# Patient Record
Sex: Female | Born: 1960
Health system: Southern US, Community
[De-identification: ages and names within clinical notes are randomized; demographics above are authoritative.]

## PROBLEM LIST (undated history)

## (undated) DIAGNOSIS — E785 Hyperlipidemia, unspecified: Secondary | ICD-10-CM

## (undated) DIAGNOSIS — R0600 Dyspnea, unspecified: Secondary | ICD-10-CM

## (undated) DIAGNOSIS — I5189 Other ill-defined heart diseases: Secondary | ICD-10-CM

## (undated) DIAGNOSIS — R55 Syncope and collapse: Secondary | ICD-10-CM

## (undated) DIAGNOSIS — I2089 Other forms of angina pectoris: Secondary | ICD-10-CM

## (undated) DIAGNOSIS — Z9889 Other specified postprocedural states: Secondary | ICD-10-CM

## (undated) DIAGNOSIS — M549 Dorsalgia, unspecified: Secondary | ICD-10-CM

## (undated) DIAGNOSIS — M21371 Foot drop, right foot: Secondary | ICD-10-CM

## (undated) DIAGNOSIS — T192XXA Foreign body in vulva and vagina, initial encounter: Secondary | ICD-10-CM

## (undated) DIAGNOSIS — M961 Postlaminectomy syndrome, not elsewhere classified: Secondary | ICD-10-CM

## (undated) DIAGNOSIS — R918 Other nonspecific abnormal finding of lung field: Secondary | ICD-10-CM

## (undated) DIAGNOSIS — F419 Anxiety disorder, unspecified: Secondary | ICD-10-CM

## (undated) DIAGNOSIS — I208 Other forms of angina pectoris: Secondary | ICD-10-CM

## (undated) DIAGNOSIS — K219 Gastro-esophageal reflux disease without esophagitis: Secondary | ICD-10-CM

## (undated) DIAGNOSIS — R079 Chest pain, unspecified: Secondary | ICD-10-CM

## (undated) DIAGNOSIS — R112 Nausea with vomiting, unspecified: Secondary | ICD-10-CM

## (undated) DIAGNOSIS — R519 Headache, unspecified: Secondary | ICD-10-CM

## (undated) DIAGNOSIS — G894 Chronic pain syndrome: Secondary | ICD-10-CM

## (undated) DIAGNOSIS — R51 Headache: Secondary | ICD-10-CM

## (undated) DIAGNOSIS — I10 Essential (primary) hypertension: Secondary | ICD-10-CM

## (undated) HISTORY — PX: ABDOMINAL HYSTERECTOMY: SHX81

## (undated) HISTORY — PX: TUBAL LIGATION: SHX77

## (undated) HISTORY — DX: Anxiety disorder, unspecified: F41.9

## (undated) HISTORY — DX: Hyperlipidemia, unspecified: E78.5

## (undated) HISTORY — PX: COLONOSCOPY: SHX174

## (undated) HISTORY — PX: FOOT SURGERY: SHX648

## (undated) HISTORY — PX: WISDOM TOOTH EXTRACTION: SHX21

---

## 2000-11-03 ENCOUNTER — Other Ambulatory Visit: Admission: RE | Admit: 2000-11-03 | Discharge: 2000-11-03 | Payer: Self-pay | Admitting: Obstetrics and Gynecology

## 2000-12-01 ENCOUNTER — Other Ambulatory Visit: Admission: RE | Admit: 2000-12-01 | Discharge: 2000-12-01 | Payer: Self-pay | Admitting: Obstetrics and Gynecology

## 2000-12-01 ENCOUNTER — Encounter (INDEPENDENT_AMBULATORY_CARE_PROVIDER_SITE_OTHER): Payer: Self-pay | Admitting: Specialist

## 2000-12-07 ENCOUNTER — Encounter (INDEPENDENT_AMBULATORY_CARE_PROVIDER_SITE_OTHER): Payer: Self-pay | Admitting: Specialist

## 2000-12-08 ENCOUNTER — Inpatient Hospital Stay (HOSPITAL_COMMUNITY): Admission: RE | Admit: 2000-12-08 | Discharge: 2000-12-09 | Payer: Self-pay | Admitting: Obstetrics and Gynecology

## 2000-12-30 ENCOUNTER — Encounter: Payer: Self-pay | Admitting: Obstetrics and Gynecology

## 2000-12-30 ENCOUNTER — Ambulatory Visit (HOSPITAL_COMMUNITY): Admission: RE | Admit: 2000-12-30 | Discharge: 2000-12-30 | Payer: Self-pay | Admitting: Obstetrics and Gynecology

## 2001-02-09 ENCOUNTER — Inpatient Hospital Stay (HOSPITAL_COMMUNITY): Admission: AD | Admit: 2001-02-09 | Discharge: 2001-02-09 | Payer: Self-pay | Admitting: Obstetrics and Gynecology

## 2001-04-02 ENCOUNTER — Encounter (INDEPENDENT_AMBULATORY_CARE_PROVIDER_SITE_OTHER): Payer: Self-pay | Admitting: Specialist

## 2001-04-02 ENCOUNTER — Observation Stay (HOSPITAL_COMMUNITY): Admission: RE | Admit: 2001-04-02 | Discharge: 2001-04-03 | Payer: Self-pay | Admitting: Obstetrics and Gynecology

## 2001-04-03 ENCOUNTER — Encounter: Payer: Self-pay | Admitting: Obstetrics and Gynecology

## 2002-03-10 ENCOUNTER — Encounter: Payer: Self-pay | Admitting: Gastroenterology

## 2002-03-10 ENCOUNTER — Encounter: Admission: RE | Admit: 2002-03-10 | Discharge: 2002-03-10 | Payer: Self-pay | Admitting: Gastroenterology

## 2002-04-06 ENCOUNTER — Encounter (INDEPENDENT_AMBULATORY_CARE_PROVIDER_SITE_OTHER): Payer: Self-pay

## 2002-04-06 ENCOUNTER — Observation Stay (HOSPITAL_COMMUNITY): Admission: RE | Admit: 2002-04-06 | Discharge: 2002-04-07 | Payer: Self-pay | Admitting: Obstetrics and Gynecology

## 2002-04-22 ENCOUNTER — Ambulatory Visit (HOSPITAL_COMMUNITY): Admission: RE | Admit: 2002-04-22 | Discharge: 2002-04-22 | Payer: Self-pay | Admitting: Obstetrics and Gynecology

## 2002-04-22 ENCOUNTER — Encounter: Payer: Self-pay | Admitting: Obstetrics and Gynecology

## 2002-11-24 ENCOUNTER — Other Ambulatory Visit: Admission: RE | Admit: 2002-11-24 | Discharge: 2002-11-24 | Payer: Self-pay | Admitting: Gynecology

## 2002-12-12 ENCOUNTER — Encounter: Payer: Self-pay | Admitting: Emergency Medicine

## 2002-12-12 ENCOUNTER — Inpatient Hospital Stay (HOSPITAL_COMMUNITY): Admission: EM | Admit: 2002-12-12 | Discharge: 2002-12-14 | Payer: Self-pay | Admitting: Emergency Medicine

## 2002-12-13 ENCOUNTER — Encounter: Payer: Self-pay | Admitting: Internal Medicine

## 2002-12-13 ENCOUNTER — Encounter (INDEPENDENT_AMBULATORY_CARE_PROVIDER_SITE_OTHER): Payer: Self-pay | Admitting: Cardiovascular Disease

## 2003-02-09 ENCOUNTER — Encounter: Payer: Self-pay | Admitting: Gynecology

## 2003-02-09 ENCOUNTER — Encounter: Admission: RE | Admit: 2003-02-09 | Discharge: 2003-02-09 | Payer: Self-pay | Admitting: Gynecology

## 2003-03-12 ENCOUNTER — Encounter: Payer: Self-pay | Admitting: Emergency Medicine

## 2003-03-12 ENCOUNTER — Emergency Department (HOSPITAL_COMMUNITY): Admission: EM | Admit: 2003-03-12 | Discharge: 2003-03-12 | Payer: Self-pay | Admitting: Emergency Medicine

## 2003-12-13 ENCOUNTER — Other Ambulatory Visit: Admission: RE | Admit: 2003-12-13 | Discharge: 2003-12-13 | Payer: Self-pay | Admitting: Gynecology

## 2004-05-27 ENCOUNTER — Emergency Department (HOSPITAL_COMMUNITY): Admission: EM | Admit: 2004-05-27 | Discharge: 2004-05-27 | Payer: Self-pay | Admitting: Emergency Medicine

## 2005-01-30 ENCOUNTER — Other Ambulatory Visit: Admission: RE | Admit: 2005-01-30 | Discharge: 2005-01-30 | Payer: Self-pay | Admitting: Gynecology

## 2005-04-01 ENCOUNTER — Encounter: Admission: RE | Admit: 2005-04-01 | Discharge: 2005-04-01 | Payer: Self-pay | Admitting: Gynecology

## 2006-02-11 ENCOUNTER — Other Ambulatory Visit: Admission: RE | Admit: 2006-02-11 | Discharge: 2006-02-11 | Payer: Self-pay | Admitting: Gynecology

## 2006-04-03 ENCOUNTER — Encounter: Admission: RE | Admit: 2006-04-03 | Discharge: 2006-04-03 | Payer: Self-pay | Admitting: Gynecology

## 2007-04-06 ENCOUNTER — Encounter: Admission: RE | Admit: 2007-04-06 | Discharge: 2007-04-06 | Payer: Self-pay | Admitting: Obstetrics and Gynecology

## 2007-05-31 ENCOUNTER — Inpatient Hospital Stay (HOSPITAL_COMMUNITY): Admission: EM | Admit: 2007-05-31 | Discharge: 2007-06-05 | Payer: Self-pay | Admitting: Emergency Medicine

## 2007-10-08 ENCOUNTER — Encounter: Admission: RE | Admit: 2007-10-08 | Discharge: 2007-10-08 | Payer: Self-pay | Admitting: Gastroenterology

## 2008-04-06 ENCOUNTER — Encounter: Admission: RE | Admit: 2008-04-06 | Discharge: 2008-04-06 | Payer: Self-pay | Admitting: Obstetrics and Gynecology

## 2009-04-10 ENCOUNTER — Encounter: Admission: RE | Admit: 2009-04-10 | Discharge: 2009-04-10 | Payer: Self-pay | Admitting: Obstetrics and Gynecology

## 2009-04-24 ENCOUNTER — Ambulatory Visit: Payer: Self-pay | Admitting: Vascular Surgery

## 2009-04-24 ENCOUNTER — Observation Stay (HOSPITAL_COMMUNITY): Admission: EM | Admit: 2009-04-24 | Discharge: 2009-04-25 | Payer: Self-pay | Admitting: Emergency Medicine

## 2009-04-24 ENCOUNTER — Encounter (INDEPENDENT_AMBULATORY_CARE_PROVIDER_SITE_OTHER): Payer: Self-pay | Admitting: Emergency Medicine

## 2011-02-03 LAB — BASIC METABOLIC PANEL
Creatinine, Ser: 0.98 mg/dL (ref 0.4–1.2)
GFR calc Af Amer: >60 mL/min (ref >60)
GFR calc non Af Amer: >60 mL/min (ref >60)
Glucose, Bld: 93 mg/dL (ref 70–99)
Potassium: 3.9 mEq/L (ref 3.5–5.1)
Sodium: 139 mEq/L (ref 135–145)

## 2011-02-03 LAB — CBC
Hemoglobin: 13 g/dL (ref 12.0–15.0)
MCHC: 33.8 g/dL (ref 30.0–36.0)
MCV: 92.4 fL (ref 78.0–100.0)
Platelets: 284 10*3/uL (ref 150–400)
RBC: 4.16 MIL/uL (ref 3.87–5.11)

## 2011-02-03 LAB — PROTIME-INR
INR: 0.9 (ref 0.00–1.49)
Prothrombin Time: 12.6 seconds (ref 11.6–15.2)

## 2011-02-03 LAB — DIFFERENTIAL
Basophils Absolute: 0 10*3/uL (ref 0.0–0.1)
Basophils Relative: 1 % (ref 0–1)
Lymphocytes Relative: 48 % — ABNORMAL HIGH (ref 12–46)
Lymphs Abs: 2.4 10*3/uL (ref 0.7–4.0)
Neutro Abs: 2 10*3/uL (ref 1.7–7.7)
Neutrophils Relative %: 42 % — ABNORMAL LOW (ref 43–77)

## 2011-02-03 LAB — POCT I-STAT, CHEM 8
Creatinine, Ser: 1.1 mg/dL (ref 0.4–1.2)
Potassium: 4.1 mEq/L (ref 3.5–5.1)
Sodium: 138 mEq/L (ref 135–145)

## 2011-02-03 LAB — RAPID URINE DRUG SCREEN, HOSP PERFORMED
Amphetamines: NOT DETECTED
Barbiturates: NOT DETECTED
Benzodiazepines: NOT DETECTED
Cocaine: NOT DETECTED
Opiates: POSITIVE — AB

## 2011-02-03 LAB — CARDIAC PANEL(CRET KIN+CKTOT+MB+TROPI)
Relative Index: 0.9 (ref 0.0–2.5)
Troponin I: 0.01 ng/mL (ref 0.00–0.06)

## 2011-02-03 LAB — URINALYSIS, ROUTINE W REFLEX MICROSCOPIC
Bilirubin Urine: NEGATIVE
Protein, ur: NEGATIVE mg/dL
Specific Gravity, Urine: 1.008 (ref 1.005–1.030)

## 2011-02-03 LAB — HEPATIC FUNCTION PANEL
ALT: 20 U/L (ref 0–35)
AST: 28 U/L (ref 0–37)
Albumin: 3.8 g/dL (ref 3.5–5.2)
Alkaline Phosphatase: 64 U/L (ref 39–117)
Bilirubin, Direct: <0.1 mg/dL (ref 0.0–0.3)

## 2011-02-03 LAB — POCT CARDIAC MARKERS
Myoglobin, poc: 91.8 ng/mL (ref 12–200)
Troponin i, poc: <0.05 ng/mL (ref 0.00–0.09)

## 2011-02-03 LAB — APTT: aPTT: 35 seconds (ref 24–37)

## 2011-02-03 LAB — TROPONIN I: Troponin I: 0.01 ng/mL (ref 0.00–0.06)

## 2011-02-03 LAB — CK TOTAL AND CKMB (NOT AT ARMC): CK, MB: 1 ng/mL (ref 0.3–4.0)

## 2011-02-03 LAB — LIPASE, BLOOD: Lipase: 64 U/L — ABNORMAL HIGH (ref 11–59)

## 2011-03-11 NOTE — H&P (Signed)
[name], [name]           ACCOUNT NO.:  [id number]   MEDICAL RECORD NO.:  [id number]          PATIENT TYPE:  EMS   LOCATION:  MAJO                         FACILITY:  [facility]   PHYSICIAN:  Lonia Blood, M.D.DATE OF BIRTH:  July 19, 1961   DATE OF ADMISSION:  05/31/2007  DATE OF DISCHARGE:                              HISTORY & PHYSICAL   CHIEF COMPLAINT:  Chest pain.   HISTORY OF PRESENT ILLNESS:  [name] is a 50 year old  pleasant female with a significant family history of coronary disease,  and a personal history of hypertension.  She is on hormone replacement  therapy.  She herself had a cardiac catheterization in 2004, which was  completely normal.  She reports that for the last week she has had  intermittent upper sternal and left-sided pressure-type chest pain.  She  also describes this as a catch in her chest intermittently.  This pain  has occurred at rest, but does in fact to be worse with exertion.  When  she exerts herself she has had significant shortness of breath, beyond  her usual.  This has been progressively worsening over the last week.  The sensation of the catching in her chest has also worsened at times  with particular movements, or with the physical exertion.   There has been no hematemesis.  There has been no nausea or vomiting.  There has been no diarrhea or constipation.  The patient has been very  gassy.  She has belched and been quite flatulent.  These episodes,  however, have failed to reveal her chest symptoms.   Her symptoms worsened over the weekend.  Therefore she thought that it  was best to present for evaluation in the emergency room tonight -- at  the encouragement of her husband.  The patient has also noted a  significant swelling in both of hands for the last week.  She has also  felt that her right foot has been swelling, with no similar swelling in  the left foot.  There has been no pain in the calf, and no  generalized  swelling of the entire leg, however.  She has not experienced these  symptoms until this week.   PAST MEDICAL HISTORY:  1. Hypertension.  2. Status post hysterectomy with bilateral salpingo-oophorectomy.  3. Prior history of moderate obesity.  4. Normal cardiac catheterization, February 2004.   OUTPATIENT MEDICATIONS:  1. Hydrochlorothiazide unknown dose daily.  2. Hormone replacement therapy.   ALLERGIES:  PENICILLIN.   FAMILY HISTORY:  The patient has multiple maternal aunts who had  coronary disease, and died before 20.  The patient's mother died in her  mid-40s from an MI.   SOCIAL HISTORY:  The patient does not smoke, nor has she ever.  She  occasionally partakes of alcohol, but not to excess.  She is married.  She has one healthy child.  She works in an office setting.   LABS:  Point-of-care cardiac markers are negative x2.  A 12-lead EKG  reveals normal sinus rhythm with a first-degree AV block; with a PR of  213, but no acute ST  or T-wave changes.  A chest x-ray reveals no acute  disease.  A CBC is unremarkable.  BMET is unremarkable, with the  exception of potassium at 3.3.   PHYSICAL EXAMINATION:  Temperature 97.5, blood pressure 168/91, heart  rate 87, respiratory rate 20.  O2 saturations 100% on room air.  GENERAL:  A well developed, well nourished, moderately overweight female  in no acute respiratory distress.  HEENT:  Normocephalic and atraumatic.  Pupils are equal, round and  reactive to light and accommodation.  Extraocular muscles intact  bilaterally.  OC/OP clear.  NECK:  No JVD, no lymphadenopathy.  LUNGS:  Clear to auscultation bilaterally, without wheezes or rhonchi.  CARDIOVASCULAR:  Regular rate and rhythm, without murmur, gallop or rub.  Normal S1 and S2.  ABDOMEN:  Nontender, nondistended.  Soft.  Bowel sounds are present.  No  hepatosplenomegaly. No rebound and no ascites.  EXTREMITIES:  No significant clubbing, cyanosis or edema of  bilateral  lower extremities.  NEUROLOGIC:  Strength 5/5 bilateral upper and lower extremities.  Intact  to sensation and pinprick throughout.  Cranial nerves II-XII intact  bilaterally.  Alert and oriented x4.   IMPRESSION AND PLAN:  1. CHEST PAIN.  The 2 primary concerns regarding Ms. Purpura's      chest pain are that of true angina versus pulmonary embolism.  The      fact that she is on hormone replacement therapy puts her at      increased risk.  Her moderate obesity also increases her risk as      well.  The complaint of unilateral right foot swelling is      concerning.   I will place the patient on full dose Lovenox.  We will obtain a spiral  CT scan of the chest.  If this is positive, our evaluation will be done.  If this is in fact negative, we will consider Doppler evaluation.  At  the present time, however, the patient does not clinically appear to be  suffering with severe edema.   In regards to the patient's chest pain, her family history is quite  concerning.  I will check her lipid level.  We will place her  empirically on aspirin and a nitroglycerin drip as her pain continues.  We will place her on full-dose Lovenox for the combined indication of  possible PE, as well as, what sounds to be, unstable angina.  It is true  that she had a normal cardiac catheterization approximately 4 years ago,  but certainly 4 years is enough time to accumulate significant coronary  disease.   We will rule her out with serial cardiac enzymes.  We will treat her  with a beta blocker as well as an ACE inhibitor.  We will follow serial  EKGs.  We will consider further risk stratification when other issues  are ruled out.   1. UNCONTROLLED HYPERTENSION.  The patient has poorly controlled      hypertension.  Part of this is likely due to the stress of her      acute hospitalization.  We will treat the patient with her usual      hydrochlorothiazide; but we will also add beta blocker  and ACE      inhibitor (given the indication of possible coronary disease).   1. HYPOKALEMIA.  This appears to be related to the patient's ongoing      hydrochlorothiazide therapy.  We will replace her with IV potassium  as well as p.o.  We will check a magnesium level.      Lonia Blood, M.D.  Electronically Signed     JTM/MEDQ  D:  05/31/2007  T:  05/31/2007  Job:  [id number]   cc:   Olene Craven, M.D.

## 2011-03-11 NOTE — H&P (Signed)
[name], [name]           ACCOUNT NO.:  [id number]   MEDICAL RECORD NO.:  [id number]          PATIENT TYPE:  OBV   LOCATION:  4709                         FACILITY:  [facility]   PHYSICIAN:  Corinna L. Lendell Caprice, MDDATE OF BIRTH:  05-28-1961   DATE OF ADMISSION:  04/24/2009  DATE OF DISCHARGE:                              HISTORY & PHYSICAL   CHIEF COMPLAINT:  Nausea, vomiting, chest pain.   HISTORY OF PRESENT ILLNESS:  [name] is a 50 year old black  female who presents with pleuritic chest pain.  She also has had nausea  and vomiting for 1-2 days.  She has received several sedating  medications and is currently a somewhat difficult historian.  I am told  that prior to these medications she was more coherent.  She has a  history recurrent chest pain.  She had a cardiac catheterization in  2004, which showed normal coronary arteries and left ventricle.  She  also had admission in 2008 for chest pain, which was atypical but the  patient was sent for outpatient stress test.  She reports that the  stress test was normal.  Her pain is slightly better currently.  She is  currently still nauseated.  She has no abdominal pain.  She has received  2 doses of morphine, aspirin, albuterol, Atrovent, Zofran, Benadryl,  prednisone, and sublingual nitroglycerin here in the emergency room.  We  have no records from [facility].  The patient  denies fevers, chills.  She denies any hematemesis.  She denies cough,  diarrhea.   PAST MEDICAL HISTORY:  1. Hypertension.  2. Hyperlipidemia.  3. Noncardiac chest pain.   MEDICATIONS:  Per triage notes include metoprolol 50 mg twice a day,  Diovan dose unknown.  The patient reports that she was on about 7 other  medications and weaned herself off many of them.  On discharge in  2008, she had also been on hydrochlorothiazide, Lotensin, aspirin,  Zocor, and a prednisone taper as well as Maalox.   She is allergic to  PENICILLIN.   SOCIAL HISTORY:  She denies smoking or excessive alcohol use.  She  denies drug use.   FAMILY HISTORY:  Her mother died of heart attack in her 23s.   REVIEW OF SYSTEMS:  As above, otherwise negative, although the patient  appears slightly confused.   PHYSICAL EXAMINATION:  VITAL SIGNS:  Temperature is 97.8, blood pressure  161/94, pulse 60, respiratory rate 20, oxygen saturation 100% on room  air.  GENERAL:  The patient is a groggy-appearing quiet female who  occasionally falls asleep, but has facial grimace.  HEENT:  Normocephalic, atraumatic.  Pupils equal, round, and reactive to  light.  She has slightly dry mucous membranes.  Oropharynx is without  erythema or exudate.  NECK:  Supple.  No lymphadenopathy.  No carotid bruits.  No JVD.  LUNGS:  Diminished throughout.  Poor inspiratory effort.  No wheezes,  rhonchi, or rales.  CARDIOVASCULAR:  Regular rate and rhythm without murmurs, gallops, or  rubs.  No chest wall tenderness.  ABDOMEN:  Obese, soft, nontender, nondistended.  GU:  Deferred.  RECTAL:  Deferred.  EXTREMITIES:  No clubbing, cyanosis, or edema.  SKIN:  No rash.  PSYCHIATRIC:  Occasional agitation.  NEUROLOGIC:  She is groggy, oriented x3.  Cranial nerves and  sensorimotor exam are intact.   LABORATORY DATA:  CBC is unremarkable.  PT/PTT normal.  Basic metabolic  panel normal.  Point-of-care enzymes normal.  Urinalysis shows a  specific gravity of 1.008, negative ketones, negative blood, negative  protein, negative nitrite, negative leukocyte esterase.  EKG reportedly  shows normal sinus rhythm, is not currently on the chart.  CT angiogram  of the chest shows no pulmonary emboli.  No pericardial effusion.  No  pulmonary edema or pleural fluid.  Limited view of the upper abdomen  unremarkable.  Gas present throughout the esophagus.  Two views of the  chest is normal.  Bilateral Dopplers of her legs showed no DVT.   ASSESSMENT AND PLAN:  1.  Atypical chest pain:  We will try Toradol.  Suspect      gastrointestinal in origin given her recent vomiting history.  I      will obtain records from [facility].  She will      be placed on telemetry on observation.  Check liver function tests,      lipase, serial cardiac enzymes, urine drug screen.  She will get      proton pump inhibitor, nausea medication as needed.  Currently, she      is quite sedated and we will avoid any further morphine.  2. Hypertension.  Medications unclear.  We will give Diovan 80 mg and      continue metoprolol.  3. Reported hyperlipidemia.  Apparently, she has been noncompliant      with medications.  4. Previous history of chest pain.  See above.  5. Obesity.      Corinna L. Lendell Caprice, MD  Electronically Signed     Corinna L. Lendell Caprice, MD  Electronically Signed    CLS/MEDQ  D:  04/24/2009  T:  04/25/2009  Job:  [id number]   cc:   Stacie Acres. Cliffton Asters, M.D.

## 2011-03-11 NOTE — Discharge Summary (Signed)
[name], [name]           ACCOUNT NO.:  [id number]   MEDICAL RECORD NO.:  [id number]          PATIENT TYPE:  INP   LOCATION:  3705                         FACILITY:  [facility]   PHYSICIAN:  Madaline Savage, MD        DATE OF BIRTH:  June 05, 1961   DATE OF ADMISSION:  05/31/2007  DATE OF DISCHARGE:  06/05/2007                               DISCHARGE SUMMARY   PRIMARY CARE PHYSICIAN:  Dr. Barbee Shropshire.   CONSULTATIONS:  She was seen by Dr. Alanda Amass in the hospital from  [facility].   DISCHARGE DIAGNOSES:  1. Atypical chest pain, rule out acute coronary syndrome, for      outpatient stress test.  2. Poorly controlled hypertension.  3. Hyperlipidemia.  4. Strong family history of coronary artery disease.  5. Hypokalemia.   DISCHARGE MEDICATIONS:  1. Hydrochlorothiazide 25 mg daily.  2. Lopressor 50 mg twice daily.  3. Lotensin 10 mg daily.  4. Aspirin 81 mg daily.  5. Zocor 40 mg daily.  6. Prednisone 20 mg once a day for 2 days and then 10 mg once a day      for 2 days and then stop.  7. Maalox 17 g in 8 oz water daily as needed.   HISTORY OF PRESENT ILLNESS:  For history and physical, see the history  and physical dictated by Dr. Sharon Seller on May 31, 2007.  In short, Ms.  [name] is a 50 year old lady with a strong family history of  coronary artery disease, who came in with the complaint of atypical  chest pain.  She was admitted for further evaluation and management.   PROBLEM LIST:  1. Chest pain.  She was admitted and three sets of cardiac markers      were done, which were negative.  We consulted [facility] people, who evaluated the patient and they think it is      likely that this patient has cardiac chest pain.  She did have a      cardiac catheterization in February 2004, which was normal.  They      arranged for an outpatient stress test at this point in time.  She      is still getting this chest  discomfort on and off at this point in      time.  She will continue on the aspirin and beta blocker at home.  2. Uncontrolled hypertension, high blood pressure.  Both markedly      elevated in the course of hospital stay.  She was started on      Lopressor and Lotensin.  At this time, her blood pressure is under      good control.  She will continue on these at home.  3. Hyperlipidemia.  We did a lipid profile on her and that showed an      elevated cholesterol of 213 and a LDL of 150.  HDL was 49 and      triglyceride was 72.  She was started on Zocor 40 mg once daily,  which she will continue at home.   DISPOSITION:  She is now being discharged home in stable condition.  She  has a follow up appointment with cardiology on August 19, and she is  scheduled for stress Myoview on August 14 at the [facility]  [facility] office.   FOLLOW UP:  She was asked to follow up with her primary care doctor, Dr.  Barbee Shropshire in one week, and she has a follow up appointment with Dr.  Alanda Amass on June 15, 2007 at 10:30 a.m.  She will follow up with Dr.  Alanda Amass after her stress test, which is scheduled on June 10, 2007  at 9:45 a.m.      Madaline Savage, MD  Electronically Signed     PKN/MEDQ  D:  06/05/2007  T:  06/05/2007  Job:  [id number]   cc:   Olene Craven, M.D.

## 2011-03-14 NOTE — Discharge Summary (Signed)
[facility]  Patient:    [name], [name] Visit Number: [id number] MRN: [id number]          Service Type: DSU Location: [id number] Attending Physician:  Leonard Schwartz Dictated by:   Janine Limbo, M.D. Admit Date:  04/06/2002 Discharge Date: 04/07/2002                             Discharge Summary  DISCHARGE DIAGNOSES:             1. Pelvic pain.                                  2. Pelvic adhesions.                                  3. Left ovarian cyst.  PROCEDURES THIS ADMISSION:       1. Diagnostic laparoscopy.                                  2. Laparoscopic lysis of adhesions.                                  3. Laparoscopic left salpingo-oophorectomy.  HISTORY OF PRESENT ILLNESS:      The patient is a 50 year old female, para 1-0-3-1, who presents with pelvic pain and a history of pelvic adhesions.  She has already had a laparoscopic vaginal hysterectomy as well as a laparoscopic right salpingo-oophorectomy.  These were both performed in 2002.  The patient continues to have pain and she presents now to have her left tube and ovary removed.  Please see her dictated history and physical exam for details.  HOSPITAL COURSE:                 On April 06, 2002, the patient was taken to the operating room where she underwent a diagnostic laparoscopy.  Operative findings included adhesions between the large bowel and the left pelvic side wall.  She was noted to have a corpus luteum cyst on the left ovary.  The adhesions were lysed and the left tube and ovary were removed without difficulty.  The patient tolerated her procedure well.  Her postoperative course was complicated by some nausea which resolved with antiemetics.  She remained afebrile.  On the morning of April 07, 2002, the patient was feeling better and her vital signs were stable.  She was discharged to home.  DISCHARGE MEDICATIONS:           1. The patient was given  Percocet one or two                                     tablets every 4 hours as needed for pain.                                  2. Phenergan 25 mg every 6 hours as needed  for nausea.                                  3. Climara patch 0.05 mg once each week.  DISCHARGE INSTRUCTIONS:          The patient will return to see Dr. Stefano Gaul in 2 weeks.  She will call for questions or concerns.  She was given a copy of the postoperative instruction sheet as prepared by the [facility] for patients who have undergone laparoscopy. Dictated by:   Janine Limbo, M.D. Attending Physician:  Leonard Schwartz DD:  04/07/02 TD:  04/09/02 Job: [id number]

## 2011-03-14 NOTE — H&P (Signed)
[facility]  Patient:    [name], [name] Visit Number: [id number] MRN: [id number]          Service Type: Attending:  Janine Limbo, M.D. Dictated by:   Janine Limbo, M.D. Adm. Date:  04/06/02                           History and Physical  HISTORY OF PRESENT ILLNESS:   The patient is a 50 year old female, para 1-0-3-1, who presents for a diagnostic laparoscopy and laparoscopic left salpingo-oophorectomy because of pelvic pain.  The patient had a laparoscopy-assisted vaginal hysterectomy in February 2002.  In June 2002, the patient had a diagnostic laparoscopy and a laparoscopic right salpingo-oophorectomy and laparoscopic lysis of adhesions.  The patient has a history of fibroids and pelvic adhesions.  The patient reports that, since the time of her surgery, she has had increasing left pelvic pain to the point that she is unable to work.  She suffered some severe dyspareunia and this has become a problem in her life.  OBSTETRICAL HISTORY:          The patient has had one term vaginal delivery and three elective pregnancy terminations.  PAST MEDICAL HISTORY:         The patient had depression associated with her pregnancy.  The patient has a history of hypertension and she is currently taking triamterene/hydrochlorothiazide 37.5/25.  DRUG ALLERGIES:               None known.  SOCIAL HISTORY:               The patient denies cigarette use, alcohol use, and recreational drug use.  REVIEW OF SYSTEMS:            The patient complains of dyspareunia.  She also has alternating bouts of constipation and diarrhea.  She says that she is somewhat depressed because of her pain and discomfort.  FAMILY HISTORY:               Both of the patients parents have hypertension. Her mother has heart disease.  PHYSICAL EXAMINATION:  VITAL SIGNS:                  Weight is 171 pounds.  HEENT:                        Within normal limits.  CHEST:                         Clear.  HEART:                        Regular rate and rhythm.  BREASTS:                      Without masses.  ABDOMEN:                      Nontender.  EXTREMITIES:                  Within normal limits.  NEUROLOGIC:                   Exam is grossly normal.  PELVIC:                       External genitalia is  normal.  The vagina is normal.  The cuff is well healed.  The cervix is absent.  The uterus is absent.  Adnexa:  There is tenderness in the left pelvis though no discrete masses can be appreciated.  Rectovaginal exam confirms.  ASSESSMENT:                   1. Pelvic pain.                               2. History of pelvic adhesions.  PLAN:                         The patient will undergo a diagnostic laparoscopy followed by a laparoscopic left salpingo-oophorectomy.  She understands the indications for her procedure and she accepts the risks of, but not limited to, anesthetic complications, bleeding, infections, and possible damage to the surrounding organs.  She understands that no guarantees can be given concerning the relief of her pelvic discomfort.  She understands that, if she should remove her left ovary, then hormone replacement therapy will likely be necessary.  We discussed the [facility] study as it relates to hormone replacement therapy and she understands the increased risk of breast cancer, heart disease, strokes, and deep venous thrombosis associated with Prempro use.  After carefully considering all the above, the patient still wants to proceed with removal of her only remaining ovary. Dictated by:   Janine Limbo, M.D. Attending:  Janine Limbo, M.D. DD:  04/05/02 TD:  04/05/02 Job: [id number]

## 2011-03-14 NOTE — Cardiovascular Report (Signed)
NAME:  [name], [name]                     ACCOUNT NO.:  [id number]   MEDICAL RECORD NO.:  [id number]                   PATIENT TYPE:  INP   LOCATION:  4731                                 FACILITY:  [facility]   PHYSICIAN:  Richard A. Alanda Amass, M.D.          DATE OF BIRTH:  1961/07/08   DATE OF PROCEDURE:  12/13/2002  DATE OF DISCHARGE:                              CARDIAC CATHETERIZATION   PROCEDURE:  1. Retrograde central aortic catheterization.  2. Selective coronary angiography by Judkins technique.  3. Left ventricular angiogram, RAO/LAO projection.  4. Abdominal angiogram, midstream PA projection.  5. Common right femoral artery closure with single-stitch Perclose AT     device, successful.   DESCRIPTION OF PROCEDURE:  The patient was brought to the second floor CP  lab in the postabsorptive state after 5 mg Valium p.o. premedication.  The  CRFA was entered with a single anterior puncture using a #18 thin-walled  needle, and a 6-French short Daig sidearm sheath was inserted after the  patient was given 2 mg of Versed IV for sedation.  Catheterization was done  with 6-French 4-cm taper, preformed Cordis coronary, and pigtail catheters.  LV angiogram was done in the RAO and LAO projection at 25 cc/14 cc per  second, and 20 cc/12 cc per second.  Omnipaque dye was used throughout the  procedure.  Pullback pressure of the CA was performed showing no gradient  across the aortic valve.  Abdominal aortic angiogram was done above the  level of the renal arteries with a pigtail catheter at 15 cc/20 cc per  second, demonstrating single normal renal arteries bilaterally and normal  infrarenal abdominal aorta on limited injection.  Right femoral angiogram  was done through the sidearm sheath in the oblique projection showing a good  stick into the high RCFA with a nice, smooth vessel and intact hypogastrics.  Heparin was held, on call to the laboratory.  The right femoral puncture was  then closed with a single stitch, 6-French Perclose AT device, successfully.  The patient was transferred to the holding area for postoperative care in  stable condition with room air, ABGs pending.   PRESSURES:  1. LV:  134/0; LVEDP:  16 mmHg.  2. CA:  134/80 mmHg.  3. There was no gradient across the aortic valve on catheter pullback.   ANGIOGRAPHIC DATA:  1. LV angiogram in the RAO and LAO projections show a normally contracting     ventricle with sinus beats.  Estimated EF approximately 55%.  No mitral     regurgitation present.  2. The main left coronary was normal.  3. The left anterior descending artery was widely patent and smooth     throughout its course.  It coursed to the apex and undersurface of the     heart.  It gave off 3 normal moderate-sized diagonal branches from the     proximal and mid LAD.  4. The circumflex artery was nondominant,  of moderate size, giving off a     normal small OM1, a normal bifurcating large OM2, and a normal PABG and     atrial branch.  5. The right coronary was a dominant vessel, tortuous, widely patent, and     smooth throughout its course.  There was some mild catheter spasm at the     tip of the RCA catheter, resolved with catheter removal.  It was a normal     vessel throughout with normal flow.  There was no coronary spasm     visualized.   CONCLUSION:  This 50 year old African-American married mother of one 14-year-  old is a nonsmoker but has a strong family history of coronary artery  disease.  One sister had a CABG at age 53, and her mother had an MI in her  late 24s.  She has been on HRT for the last 6-12 months after remote  hysterectomy.  She has a history of exogenous obesity and systemic  hypertension, on Maxzide.  She was admitted to the hospital with several  days of chest discomfort, waxing and waning, and prolonged chest discomfort  on the day of admission, responding to IV Toradol.  This chest pain was  characterized as  substernal pressure with an inspiratory pleuritic-type  component with no pleural or pericardial rubs.  Room air O2 saturations were  100%, and ABGs are pending.  There were no acute EKG changes, and myocardial  infarction was ruled out by serial enzymes and her EKGs.  The patient had  recurrent episodes of pain in the hospital today, on medical therapy, and  with her family history and risk factors, it was felt best to proceed with  diagnostic angiography.   Fortunately, diagnostic coronary angiography reveals entirely normal  coronary anatomy with no evidence of atherosclerotic disease, no evidence of  renal artery stenosis, or fibromuscular dysplasia (FMV) with her history of  hypertension.   The etiology of her chest pain is not clear.  Clinically, it appears to be  musculoskeletal, possibly costochondritis.  Since she is on HRT, she may  require a spiral CT for further evaluation to rule out PE.   CATHETERIZATION DIAGNOSES:  1. Chest pain, etiology not determined.  2. Normal coronary arteries and left ventricle.  3. Systemic hypertension, normal renal arteries.  4. Exogenous obesity.  5. Strong family history of coronary disease.  6. Possible gastroesophageal reflux disease.  7. Recent hormone replacement therapy.  Rule out pulmonary embolus.                                               Richard A. Alanda Amass, M.D.    RAW/MEDQ  D:  12/13/2002  T:  12/13/2002  Job:  [id number]   cc:   Cardiac Catheterization Lab   Olene Craven, M.D.  [address]  [address]  [place]  [place] [address]  Fax: [phone]

## 2011-03-14 NOTE — Discharge Summary (Signed)
[facility]  Patient:    [name], [name]                    MRN: [id number] Adm. Date:  [id number] Disc. Date: 12/09/00 Attending:  Leonard Schwartz Dictator:   Henreitta Leber, P.A.                           Discharge Summary  DISCHARGE DIAGNOSES:          1. Pelvic pain.                               2. Uterine fibroids.                               3. Dysmenorrhea.                               4. Menorrhagia.                               5. Pelvic adhesions.  OPERATIONS:                   Laparoscopic-assisted vaginal hysterectomy.  HISTORY OF PRESENT ILLNESS:   [name] is a 50 year old married African-American female, para 1-0-3-1, with a history of uterine fibroids, dysmenorrhea, and menorrhagia, who presents for laparoscopic-assisted vaginal hysterectomy because of chronic pelvic discomfort.  Please see the patients dictated history and physical exam for details.  PHYSICAL EXAMINATION:  VITAL SIGNS:                  Weight is 173 pounds, height is 5 feet 5 inches tall.  GENERAL:                      Within normal limits.  PELVIC:                       EGBUS is within normal limits.  The vagina is normal.  Cervix is slightly tender to motion.  The uterus is difficult to assess secondary to discomfort.  Adnexa are tender.  RECTOVAGINAL:                 Tender.  HOSPITAL COURSE:              On the date of admission the patient underwent a laparoscopic-assisted vaginal hysterectomy during which time it was discovered that the patient had some pelvic adhesions of the right adnexal region. However, the patient tolerated the procedure well.  Postoperative course was marked by the patient having difficulty achieving relief from her pain. However, by the evening of postoperative day #1, she was comfortable and continued to progress.  By postoperative day #2 the patient had achieved return of bowel and bladder function and was  deemed ready for discharge home. Postoperative hemoglobin 10.6, preoperative hemoglobin 12.5.  DISCHARGE MEDICATIONS:        1. Percocet 1-2 tablets every four to six hours                                  as needed for pain.  2. Ibuprofen 600 mg 1 tablet every six hours                                  with food for five days, then as needed for                                  pain.                               3. Phenergan 25 mg 1 tablet four times a day as                                  needed for nausea.                               4. Stool softener 60 mg 1 tablet twice daily                                  until bowel movements are regular.                               5. Iron 325 mg 1 tablet twice daily for six                                  weeks.  FOLLOW-UP:                    The patient has an appointment in six weeks with Dr. Leonard Schwartz on January 18, 2001, at 11 a.m.  DISCHARGE INSTRUCTIONS:       The patient was given a copy of [facility] postoperative instruction sheet.  She was further advised to avoid driving for two weeks, heavy lifting for four weeks, and intercourse for six weeks.  PATHOLOGY:                    Final pathology was not available at time of the patients discharge.DD:  12/09/00 TD:  12/09/00 Job: [id number]

## 2011-03-14 NOTE — H&P (Signed)
[facility]  Patient:    [name], [name]                    MRN: [id number] Adm. Date:  [id number] Disc. Date: [id number] Attending:  Leonard Schwartz                         History and Physical  HISTORY OF PRESENT ILLNESS:   The patient is a 50 year old female, para 1-0-3-1, who presents for diagnostic laparoscopy and laparoscopic right oophorectomy because of right pelvic pain.  The patient had a laparoscopy-assisted vaginal hysterectomy on December 07, 2000.  Operative findings showed a uterus that was upper limits normal size and fibroids were present in the uterus.  The ovaries appeared normal.  There were scars on the fallopian tubes and the tubes were shortened from prior surgery.  The patient has had continued pain since her hysterectomy.  An ultrasound has shown ovarian cysts.  Pain medications have not relieved her discomfort. The patient reports that she is now to the point she is having difficulty performing her normal daily functions.  She wants to proceed with oophorectomy.  OBSTETRICAL HISTORY:          The patient has had one term vaginal delivery and three elective pregnancy terminations.  PAST MEDICAL HISTORY:         The patient had depression associated with her pregnancy.  MEDICATIONS:                  She is currently taking pain medications only.  DRUG ALLERGIES:               None known.  SOCIAL HISTORY:               The patient denies cigarette use, alcohol use, and recreational drug use.  REVIEW OF SYSTEMS:            The patient complains of dyspareunia.  She has had alternating bouts of constipation and diarrhea.  FAMILY HISTORY:               Both of the patients parents have hypertension. The patients mother has heart disease.  PHYSICAL EXAMINATION:  GENERAL:                      Weight is 173 pounds.  HEENT:                        Within normal limits.  CHEST:                        Clear.  HEART:                         Regular rate and rhythm.  BREASTS:                      Without masses.  ABDOMEN:                      Nontender.  EXTREMITIES:                  Within normal limits.  NEUROLOGICAL EXAM:            Grossly normal.  PELVIC EXAM:  External genitalia is normal.  The vagina is normal.  Cervix is absent, uterus is absent.  Adnexa; tenderness on the right. Rectovaginal exam confirms.  ASSESSMENT:                   1. Continued pelvic pain.                               2. History of ovarian cysts.                               3. Dyspareunia.  PLAN:                         A long discussion was held with the patient about her medical and surgical options.  She elects to proceed with diagnostic laparoscopy and she wants to proceed with right oophorectomy.  She understands that no guarantees can be given concerning the complete relief of her pain. She accepts the risks associated with her procedure and she understands those risks are not limited to anesthetic complications, bleeding, infections, or possible damage to the surrounding organs. DD:  03/25/01 TD:  03/25/01 Job: [id number]

## 2011-03-14 NOTE — Op Note (Signed)
[facility]  Patient:    [name], [name]                    MRN: [id number] Proc. Date: 04/02/01 Adm. Date:  [id number] Attending:  Leonard Schwartz                           Operative Report  PREOPERATIVE DIAGNOSES:       1. Right lower quadrant pain.                               2. Pelvic adhesions.                               3. Right ovarian cyst.  POSTOPERATIVE DIAGNOSES:      1. Right lower quadrant pain.                               2. Pelvic adhesions.                               3. Right ovarian cyst.  PROCEDURE:                    1. Diagnostic laparoscopy.                               2. Laparoscopic right salpingo-oophorectomy.                               3. Laparoscopic lysis of adhesions.  SURGEON:                      Janine Limbo, M.D.  ANESTHESIA:                   General.  DISPOSITION:                  Ms. Pollick is a 50 year old female, para 1-0-3-1, who presents with the above-mentioned diagnosis. The patient had a laparoscopy assisted vaginal hysterectomy in February in 2002. She was found to have fibroids. Her ovaries appeared normal. There were scars on the fallopian tubes and her tubes were shortened from prior surgery. After her vaginal hysterectomy, the patient had severe right lower quadrant pain. She presents now for diagnostic laparoscopy. She asks that her right tube and ovary be removed if her left tube and ovary are normal. She understands the indications for her procedure and she accepts the risk of, but not limited to, anesthetic complications, bleeding, infections, and possible damage to the surrounding organs.  FINDINGS:                     The uterus was surgically absent. The left fallopian tube and the left ovary were scarred to the bowel on the right side. Once the scars were removed, then the left tube and ovary appeared normal. The right fallopian tube and right ovary were  scarred to the right pelvic sidewall. There was a 2 cm cyst of the right ovary. There were adhesions between the bowel and the left pelvic  sidewall. There were adhesions between loops of bowel in the midline. The appendix, liver, and upper abdomen appeared normal.  ESTIMATED BLOOD LOSS:         20 cc.  DESCRIPTION OF PROCEDURE:     The patient was taken to the operating room where a general anesthetic was given. The patients abdomen, perineum, and vagina were prepped with multiple layers of Betadine. A Foley catheter was placed in the bladder. The patient then had an examination under anesthesia. A padded sponge stick was placed in the vagina. The patient was sterilely draped. The subumbilical incision was injected with 0.5% Marcaine. An incision was made and the Veress needle was inserted without difficulty. Proper placement was confirmed using the saline drop test. A pneumoperitoneum was obtained. Two suprapubic areas were injected with 0.5% Marcaine and two 5 mm trocars were placed in the lower abdomen. Pictures were taken of the patients pelvic anatomy. The adhesions were lysed using a combination of blunt dissection, sharp dissection, and hydrodissection. The right ureter was identified and followed through its course in the pelvis. The adhesions between the right fallopian tube and the right ovary and the right pelvic sidewall were lysed. The right infundibulopelvic ligament was then isolated. Two Endoloops of 0 Vicryl were placed around the infundibulopelvic ligament. The ligament was cut. Hemostasis was noted to be adequate. The right ovary and tube were cut into segments and removed through the umbilical port. The pelvis was then vigorously irrigated. Hemostasis was achieved using the bipolar cautery. Care was taken not to damage any of the vital structures. Hemostasis was noted to adequate at that point. The pelvis was again irrigated. The pneumoperitoneum was allowed to escape  after the bowel was carefully inspected and there was no evidence of trocar damage to the bowel. All instruments were removed. The incisions were closed using deep and superficial sutures of 4-0 Vicryl. Sponge, needle, and instrument counts were correct on two occasions. The estimated blood loss was 20 cc. The patient tolerated her procedure well. The patient was awakened from her anesthetic and taken to the recovery room in stable condition.  FOLLOWUP INSTRUCTIONS:        The patient will return to see Dr. Stefano Gaul in two to three weeks for followup examination. She was given a copy of the postoperative instruction sheet as prepared by the [facility] for patients who have undergone laparoscopy. She was given a prescription for Vicodin, one to two p.o. q.4h. p.r.n. pain. She will call for questions or concerns.  Fluid was aspirated from the pelvis and sent to pathology for cytologic evaluation and also sent to microbiology for aerobic culture. DD:  04/02/01 TD:  04/03/01 Job: [id number]

## 2011-03-14 NOTE — Op Note (Signed)
[facility]  Patient:    [name], [name] Visit Number: [id number] MRN: [id number]          Service Type: DSU Location: [id number] Attending Physician:  Leonard Schwartz Dictated by:   Janine Limbo, M.D. Proc. Date: 04/06/02 Admit Date:  04/06/2002 Discharge Date: 04/07/2002                             Operative Report  PREOPERATIVE DIAGNOSES:       1. Pelvic pain.                               2. Pelvic adhesions.                               3. Left ovarian cyst.  POSTOPERATIVE DIAGNOSES:      1. Pelvic pain.                               2. Pelvic adhesions.                               3. Left ovarian cyst.  PROCEDURE:                    Diagnostic laparoscopy with laparoscopic left salpingo-oophorectomy and laparoscopic lysis of adhesions.  SURGEON:                      Janine Limbo, M.D.  ANESTHESIA:                   General.  DISPOSITION:                  Ms. Crow is a 50 year old female para 1-0-3-1 who presents with the above mentioned diagnoses.  She understands the indications for her procedure and she accepts the risks of, but not limited to, anesthetic complications, bleeding, infections, and the possible damage to the surrounding organs.  She understands that no guarantees can be given concerning the total relief of her discomfort.  FINDINGS:                     The uterus, right fallopian tube, and right ovary are surgically absent.  There was a corpus luteum cyst on the left ovary.  There were adhesions between the bowel and the left pelvic side wall. The appendix and upper abdomen appear normal.  PROCEDURE:                    The patient was taken to the operating room where a general anesthetic was given.  The patients abdomen, perineum, and vagina were prepped with multiple layers of Betadine.  A Foley catheter was placed in the bladder.  Examination under anesthesia was performed.  A  padded sponge stitch was placed in the vagina.  The patient was sterilely draped. The subumbilical area was injected with 6 cc of 0.5% Marcaine.  An incision was made and the incision was carried sharply through the subcutaneous tissue, the fascia, and the anterior peritoneum.  The Hasson cannula was sutured into place.  A pneumoperitoneum was obtained.  The laparoscopic trocar was inserted.  The pelvic organs were visualized with findings as mentioned above. Two suprapubic areas were injected with 3 cc and then 2 cc of 0.5% Marcaine. Two incisions were made and two manipulator probes were placed into the lower abdomen under direct visualization.  The pelvis was carefully inspected and pictures were taken of the patients anatomy.  The left ureter was identified and followed throughout its course in the abdomen.  The adhesions between the bowel and the left pelvic side wall were lysed using a combination of sharp and blunt dissection.  Care was taken not to damage the bowel.  The broad ligament was opened in the left pelvic side wall and the left infundibulopelvic ligament was skeletonized.  Again, care was taken not to damage the ureter or any of the vital structures in the left pelvic side wall. Two 0 Vicryl endo loops were placed along the left endo pelvic ligament and tied securely.  The left infundibulopelvic ligament was then cut freeing the ovary.  The left ovary and fallopian tube were cut into several segments so that they could be removed through the abdominal port of the laparoscope. Hemostasis was adequate.  The pelvis was vigorously irrigated.  There were no other abnormalities noted in the pelvic cavity.  The instruments were removed after the bowel was carefully inspected to be sure there was no damage to the bowel.  The pneumoperitoneum was allowed to escape.  The incisions were closed using deep sutures of 0 Vicryl followed by superficial sutures of 4-0 Vicryl. Sponge,  needle, and instrument counts were correct on two occasions.  The estimated blood loss was 5 cc.  The patient was stable when she was taken to the recovery room.  FOLLOWUP INSTRUCTIONS:        The patient will be observed overnight and will be discharged on April 07, 2002.  She will be given pain medication as well as a Climara patch.  She will return to Dr. Stefano Gaul in two to three weeks for follow-up examination.  The patient has made arrangements not to return to work for two weeks. Dictated by:   Janine Limbo, M.D. Attending Physician:  Leonard Schwartz DD:  04/06/02 TD:  04/08/02 Job: [id number]

## 2011-03-14 NOTE — H&P (Signed)
[facility]  Patient:    [name], [name]                    MRN: [id number] Adm. Date:  12/07/00 Attending:  Janine Limbo, M.D.                         History and Physical  HISTORY OF PRESENT ILLNESS:   [name] is a 50 year old female, para 1-0-3-1, who presents for laparoscopy-assisted vaginal hysterectomy because of chronic pelvic discomfort.  The patient reports that her pain is severe and unrelenting.  It has become very difficult for her to work and to perform her normal daily functions.  An ultrasound was performed in January 2002, which showed a 9.0 x 6.7 cm uterus, and there was a fibroid present measuring 1.6 x 1.6 cm.  The ovaries appeared normal.  An endometrial biopsy was obtained that showed normal, benign secretory endometrium.  A GC and chlamydia culture were both negative.  Her Pap smear was within normal limits.  The patient reports that pain medications have not relieved her discomfort.  She has tried hormonal therapy, but this has not been successful.  She is ready to proceed at this time with definitive therapy.  The patient has had a cesarean section in the past, and she has had three elective pregnancy terminations.  PAST MEDICAL HISTORY:         The patient had depression associated with her pregnancy.  Her only current medications are the pain medications she takes.  DRUG ALLERGIES:               No known drug allergies.  SOCIAL HISTORY:               The patient denies cigarette use, alcohol use, and recreational drug use.  REVIEW OF SYSTEMS:            The patient complains of dyspareunia.  She also has constipation that alternates with diarrhea.  She also has menorrhagia.  FAMILY HISTORY:               Both of the patients parents have hypertension. The patients mother has heart disease.  PHYSICAL EXAMINATION:  VITAL SIGNS:                  Weight is 173 pounds.  HEENT:                        Within  normal limits.  CHEST:                        Clear.  HEART:                        Regular rate and rhythm.  BREASTS:                      Without masses.  ABDOMEN:                      Nontender.  EXTREMITIES:                  Within normal limits.  NEUROLOGIC:                   Grossly normal.  PELVIC:  External genitalia is normal.  The vagina is normal.  The cervix is slightly tender to motion.  The uterus is difficult to assess secondary to discomfort.  Adnexa are tender.  Rectovaginal exam is somewhat tender.  ASSESSMENT:                   1. Pelvic pain.                               2. Fibroid uterus.                               3. Menorrhagia.                               4. Dysmenorrhea.  PLAN:                         A long discussion was held with the patient about her medical and surgical options.  After carefully considering each of those options, the patient wishes to proceed at this time with hysterectomy. We will perform laparoscopy initially to be sure that there is no evidence of endometriosis which may have caused adhesive disease.  This adhesive disease may complicate vaginal surgery.  The patient understands the indications for her procedure, and she accepts the risk of, but not limited to, anesthetic complications, bleeding, infection, and possible damage to the surrounding organs. DD:  12/04/00 TD:  12/04/00 Job: [id number]

## 2011-08-11 LAB — POCT CARDIAC MARKERS
CKMB, poc: 1.2
CKMB, poc: <1 — ABNORMAL LOW
Myoglobin, poc: 55.3
Myoglobin, poc: 81
Operator id: 272551
Troponin i, poc: <0.05

## 2011-08-11 LAB — PROTIME-INR
INR: 1.1
Prothrombin Time: 14.1

## 2011-08-11 LAB — URINALYSIS, ROUTINE W REFLEX MICROSCOPIC
Bilirubin Urine: NEGATIVE
Hgb urine dipstick: NEGATIVE
Ketones, ur: NEGATIVE
Protein, ur: NEGATIVE
Urobilinogen, UA: 0.2

## 2011-08-11 LAB — CBC
HCT: 31.8 — ABNORMAL LOW
HCT: 33.1 — ABNORMAL LOW
HCT: 37.9
Hemoglobin: 10.7 — ABNORMAL LOW
Hemoglobin: 11.2 — ABNORMAL LOW
Hemoglobin: 11.5 — ABNORMAL LOW
Hemoglobin: 12.7
MCHC: 33.6
MCV: 90.5
Platelets: 249
Platelets: 297
Platelets: 310
RBC: 3.73 — ABNORMAL LOW
RBC: 4.19
RDW: 12.8
RDW: 13.1
RDW: 13.4
WBC: 17.2 — ABNORMAL HIGH
WBC: 21.3 — ABNORMAL HIGH
WBC: 4.4
WBC: 4.5

## 2011-08-11 LAB — CK TOTAL AND CKMB (NOT AT ARMC)
CK, MB: 1.2
CK, MB: 1.4
CK, MB: 1.5
Relative Index: 1
Total CK: 135
Total CK: 139

## 2011-08-11 LAB — LIPID PANEL
HDL: 55
LDL Cholesterol: 169 — ABNORMAL HIGH
Total CHOL/HDL Ratio: 4.3
Total CHOL/HDL Ratio: 4.3
VLDL: 10
VLDL: 14

## 2011-08-11 LAB — COMPREHENSIVE METABOLIC PANEL
ALT: 23
AST: 20
Albumin: 3.1 — ABNORMAL LOW
Alkaline Phosphatase: 56
Alkaline Phosphatase: 59
BUN: 6
BUN: 8
BUN: 8
CO2: 26
CO2: 28
CO2: 29
Calcium: 8.1 — ABNORMAL LOW
Calcium: 8.2 — ABNORMAL LOW
Chloride: 103
Chloride: 105
Chloride: 106
Creatinine, Ser: 0.89
Creatinine, Ser: 1
GFR calc Af Amer: >60
GFR calc Af Amer: >60
GFR calc non Af Amer: 60 — ABNORMAL LOW
GFR calc non Af Amer: >60
GFR calc non Af Amer: >60
Glucose, Bld: 118 — ABNORMAL HIGH
Glucose, Bld: 138 — ABNORMAL HIGH
Glucose, Bld: 142 — ABNORMAL HIGH
Glucose, Bld: 85
Potassium: 3.3 — ABNORMAL LOW
Potassium: 4.1
Sodium: 141
Total Bilirubin: 0.5
Total Bilirubin: 0.6
Total Protein: 6.1
Total Protein: 6.2

## 2011-08-11 LAB — TROPONIN I
Troponin I: 0.01
Troponin I: 0.02

## 2011-08-11 LAB — IRON AND TIBC
Saturation Ratios: 26
TIBC: 293

## 2011-08-11 LAB — BASIC METABOLIC PANEL
Calcium: 8.7
GFR calc Af Amer: >60
GFR calc non Af Amer: 60 — ABNORMAL LOW
Potassium: 4.2
Sodium: 142

## 2011-08-11 LAB — I-STAT 8, (EC8 V) (CONVERTED LAB)
BUN: 11
Bicarbonate: 28.2 — ABNORMAL HIGH
Glucose, Bld: 84
pCO2, Ven: 49

## 2011-08-11 LAB — T4, FREE: Free T4: 1.08

## 2011-08-11 LAB — D-DIMER, QUANTITATIVE: D-Dimer, Quant: <0.22

## 2011-08-11 LAB — APTT: aPTT: 35

## 2011-08-11 LAB — DIFFERENTIAL
Basophils Absolute: 0
Lymphocytes Relative: 35
Neutro Abs: 2.8
Neutrophils Relative %: 53

## 2011-08-11 LAB — MAGNESIUM
Magnesium: 1.9
Magnesium: 2

## 2011-08-11 LAB — RETICULOCYTES: Retic Ct Pct: 1.2

## 2011-08-11 LAB — FOLATE: Folate: 15

## 2011-08-11 LAB — PHOSPHORUS: Phosphorus: 2.7

## 2011-08-11 LAB — FERRITIN: Ferritin: 74 (ref 10–291)

## 2012-08-04 ENCOUNTER — Other Ambulatory Visit: Payer: Self-pay | Admitting: Obstetrics and Gynecology

## 2012-08-04 DIAGNOSIS — Z1231 Encounter for screening mammogram for malignant neoplasm of breast: Secondary | ICD-10-CM

## 2012-09-02 ENCOUNTER — Ambulatory Visit
Admission: RE | Admit: 2012-09-02 | Discharge: 2012-09-02 | Disposition: A | Discharge status: Home / Self Care | Payer: BC Managed Care – PPO | Source: Ambulatory Visit | Attending: Obstetrics and Gynecology | Admitting: Obstetrics and Gynecology

## 2012-09-02 DIAGNOSIS — Z1231 Encounter for screening mammogram for malignant neoplasm of breast: Secondary | ICD-10-CM

## 2012-12-09 ENCOUNTER — Emergency Department (HOSPITAL_COMMUNITY)
Admission: EM | Admit: 2012-12-09 | Discharge: 2012-12-09 | Disposition: A | Discharge status: Home / Self Care | Payer: BC Managed Care – PPO | Attending: Emergency Medicine | Admitting: Emergency Medicine

## 2012-12-09 ENCOUNTER — Encounter (HOSPITAL_COMMUNITY): Payer: Self-pay | Admitting: Emergency Medicine

## 2012-12-09 ENCOUNTER — Emergency Department (HOSPITAL_COMMUNITY): Payer: BC Managed Care – PPO

## 2012-12-09 DIAGNOSIS — W19XXXA Unspecified fall, initial encounter: Secondary | ICD-10-CM

## 2012-12-09 DIAGNOSIS — Y92410 Unspecified street and highway as the place of occurrence of the external cause: Secondary | ICD-10-CM | POA: Insufficient documentation

## 2012-12-09 DIAGNOSIS — I10 Essential (primary) hypertension: Secondary | ICD-10-CM | POA: Insufficient documentation

## 2012-12-09 DIAGNOSIS — IMO0002 Reserved for concepts with insufficient information to code with codable children: Secondary | ICD-10-CM | POA: Insufficient documentation

## 2012-12-09 DIAGNOSIS — Y9329 Activity, other involving ice and snow: Secondary | ICD-10-CM | POA: Insufficient documentation

## 2012-12-09 DIAGNOSIS — S79919A Unspecified injury of unspecified hip, initial encounter: Secondary | ICD-10-CM | POA: Insufficient documentation

## 2012-12-09 HISTORY — DX: Essential (primary) hypertension: I10

## 2012-12-09 MED ORDER — OXYCODONE-ACETAMINOPHEN 5-325 MG PO TABS: 1.0000 | ORAL_TABLET | ORAL | Status: DC | PRN

## 2012-12-09 MED ORDER — OXYCODONE-ACETAMINOPHEN 5-325 MG PO TABS
2.0000 | ORAL_TABLET | Freq: Once | ORAL | Status: AC
  Administered 2012-12-09: 2 via ORAL
  Filled 2012-12-09: qty 2
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
## 2012-12-09 NOTE — ED Notes (Signed)
Patient slipped on ice landed on right side and right buttocks radiating to right lower extremity.  Full sensation pedal pulse +2 bilaterally.

## 2012-12-13 NOTE — ED Provider Notes (Signed)
Medical screening examination/treatment/procedure(s) were performed by non-physician practitioner and as supervising physician I was immediately available for consultation/collaboration.  Toy Baker, MD 12/13/12 [phone]

## 2013-09-12 ENCOUNTER — Other Ambulatory Visit: Payer: Self-pay

## 2013-09-12 DIAGNOSIS — Z1231 Encounter for screening mammogram for malignant neoplasm of breast: Secondary | ICD-10-CM

## 2013-09-28 ENCOUNTER — Other Ambulatory Visit (HOSPITAL_COMMUNITY): Payer: Self-pay | Admitting: Internal Medicine

## 2013-09-28 ENCOUNTER — Ambulatory Visit (HOSPITAL_COMMUNITY)
Admission: RE | Admit: 2013-09-28 | Discharge: 2013-09-28 | Disposition: A | Discharge status: Home / Self Care | Payer: 59 | Source: Ambulatory Visit | Attending: Vascular Surgery | Admitting: Vascular Surgery

## 2013-09-28 DIAGNOSIS — M79604 Pain in right leg: Secondary | ICD-10-CM

## 2013-09-28 DIAGNOSIS — M79605 Pain in left leg: Secondary | ICD-10-CM

## 2013-09-28 DIAGNOSIS — M79609 Pain in unspecified limb: Secondary | ICD-10-CM | POA: Insufficient documentation

## 2013-10-13 ENCOUNTER — Ambulatory Visit
Admission: RE | Admit: 2013-10-13 | Discharge: 2013-10-13 | Disposition: A | Discharge status: Home / Self Care | Payer: Self-pay | Source: Ambulatory Visit

## 2013-10-13 DIAGNOSIS — Z1231 Encounter for screening mammogram for malignant neoplasm of breast: Secondary | ICD-10-CM

## 2014-02-10 ENCOUNTER — Ambulatory Visit: Payer: Self-pay

## 2014-02-24 ENCOUNTER — Ambulatory Visit (INDEPENDENT_AMBULATORY_CARE_PROVIDER_SITE_OTHER): Payer: Managed Care, Other (non HMO)

## 2014-02-24 VITALS — Ht 65.0 in | Wt 170.0 lb

## 2014-02-24 DIAGNOSIS — M205X9 Other deformities of toe(s) (acquired), unspecified foot: Secondary | ICD-10-CM

## 2014-02-24 DIAGNOSIS — M2020 Hallux rigidus, unspecified foot: Secondary | ICD-10-CM

## 2014-02-24 NOTE — Progress Notes (Signed)
   Subjective:    Patient ID: [name], female    DOB: Feb 23, 1961, 53 y.o.   MRN: [id number]  HPI Comments: "I have pain in these joints"  Patient c/o aching 1st MPJ bilateral, left over right, for several months. The areas get red and swollen depending upon how much she is on her feet. Her shoes are rubbing dorsally. She does a lot of standing at work and makes worse. She uses heating pad and anti-inflammatories, which helps.   Foot Pain      Review of Systems  Musculoskeletal: Positive for back pain.  Allergic/Immunologic: Positive for food allergies.  All other systems reviewed and are negative.      Objective:   Physical Exam Neurovascular status is intact pedal pulses palpable DP and PT posterior were for bilateral Refill time 3 seconds all digits. Skin temperature is warm turgor normal no edema rubor pallor or varicosities noted. Neurologically epicritic and proprioceptive sensations intact and symmetric bilateral. Orthopedic biomechanical exam reveals bony prominence dorsally and first metatarsals bilateral both are equally painful symptomatically dorsiflexion plantar flexion x-rays reveal a lavage of the first metatarsal with a long first met and squaring of the first metatarsal head with asymmetric joint space narrowing consistent with osteoarthropathy and hallux limitus/rigidus deformity . Dermatologically skin color pigment normal no open wounds or ulcerations noted patient wearing different shoes padding cushion trying to help this area taking anti-inflammatories with temporary relief. X-rays demonstrate dorsal spurring first metatarsal and bilateral elbows with functional hallux limitus and rigidus deformity. Mild retrocalcaneal spurs noted bilateral radiographically the right looks worse than left clinically left is just as severe as the right       Assessment & Plan:  Assessment hallux limitus/rigidus deformity with asked arthropathy and capsulitis first MTP  area bilateral plan at this time discussed options patient is instructed in correcting this problem and prevent further degeneration at this time consent form for [facility] with cheilectomy right foot is reviewed and signed all questions asked medication are answered there no complications surgery and surgery scheduled her convenience sometime in the next month or 2. 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 All questions asked medication are answered the surgery scheduled at this time  Harriet Masson DPM

## 2014-02-24 NOTE — Patient Instructions (Signed)
Pre-Operative Instructions  Congratulations, you have decided to take an important step to improving your quality of life.  You can be assured that the doctors of [facility] will be with you every step of the way.  1. Plan to be at the surgery center/hospital at least 1 (one) hour prior to your scheduled time unless otherwise directed by the surgical center/hospital staff.  You must have a responsible adult accompany you, remain during the surgery and drive you home.  Make sure you have directions to the surgical center/hospital and know how to get there on time. 2. For hospital based surgery you will need to obtain a history and physical form from your family physician within 1 month prior to the date of surgery- we will give you a form for you primary physician.  3. We make every effort to accommodate the date you request for surgery.  There are however, times where surgery dates or times have to be moved.  We will contact you as soon as possible if a change in schedule is required.   4. No Aspirin/Ibuprofen for one week before surgery.  If you are on aspirin, any non-steroidal anti-inflammatory medications (Mobic, Aleve, Ibuprofen) you should stop taking it 7 days prior to your surgery.  You make take Tylenol  For pain prior to surgery.  5. Medications- If you are taking daily heart and blood pressure medications, seizure, reflux, allergy, asthma, anxiety, pain or diabetes medications, make sure the surgery center/hospital is aware before the day of surgery so they may notify you which medications to take or avoid the day of surgery. 6. No food or drink after midnight the night before surgery unless directed otherwise by surgical center/hospital staff. 7. No alcoholic beverages 24 hours prior to surgery.  No smoking 24 hours prior to or 24 hours after surgery. 8. Wear loose pants or shorts- loose enough to fit over bandages, boots, and casts. 9. No slip on shoes, sneakers are best. 10. Bring  your boot with you to the surgery center/hospital.  Also bring crutches or a walker if your physician has prescribed it for you.  If you do not have this equipment, it will be provided for you after surgery. 11. If you have not been contracted by the surgery center/hospital by the day before your surgery, call to confirm the date and time of your surgery. 12. Leave-time from work may vary depending on the type of surgery you have.  Appropriate arrangements should be made prior to surgery with your employer. 13. Prescriptions will be provided immediately following surgery by your doctor.  Have these filled as soon as possible after surgery and take the medication as directed. 14. Remove nail polish on the operative foot. 15. Wash the night before surgery.  The night before surgery wash the foot and leg well with the antibacterial soap provided and water paying special attention to beneath the toenails and in between the toes.  Rinse thoroughly with water and dry well with a towel.  Perform this wash unless told not to do so by your physician.  Enclosed: 1 Ice pack (please put in freezer the night before surgery)   1 Hibiclens skin cleaner   Pre-op Instructions  If you have any questions regarding the instructions, do not hesitate to call our office.  [place]: [address] [place], [place] [address] [phone]  [place]: [address] [place], [place] [address] [phone]  [place]: [address]  [place],  [address] [phone]  Dr. Inayah Woodin   Tuchman DPM, Dr. Norman Regal DPM Dr. Zailee Vallely DPM, Dr. M. Todd Hyatt DPM, Dr. Kathryn Egerton DPM 

## 2014-02-27 ENCOUNTER — Telehealth: Payer: Self-pay | Admitting: *Deleted

## 2014-02-27 NOTE — Telephone Encounter (Signed)
Need to speak to the nurse as soon as possible about my short term disability for going out for surgery.  Have to be at work at 1 p.m, call me at this number [phone].  I returned her call.  She stated her job doesn't have anything that she can do sitting.  So, they recommended she take the whole time off.  She asked what she need to do.  I told her she would have to contact human resources and get the forms.  Then bring them here to be filled out by Marcie Bal A.  She stated she'd call human resources and get the forms to her.

## 2014-03-14 ENCOUNTER — Telehealth: Payer: Self-pay | Admitting: *Deleted

## 2014-03-14 NOTE — Telephone Encounter (Signed)
I'm scheduled for surgery in June.  When will I hear about the time that I'm supposed to be there?  I informed her that she should hear something from the surgical center the Friday before her surgery date.  They will let her know exactly what time to get there.  She stated oh okay.  My job is asking how long I will be out of work.  I told them what he said that it could be 6-8 weeks, it depends upon the healing process but they seem to want more information.  Should I get something in writing stating this.  I told her I would transfer her to Marcie Bal and she would take care of that for her.

## 2014-03-27 DIAGNOSIS — M19079 Primary osteoarthritis, unspecified ankle and foot: Secondary | ICD-10-CM

## 2014-03-27 DIAGNOSIS — M2010 Hallux valgus (acquired), unspecified foot: Secondary | ICD-10-CM

## 2014-03-27 DIAGNOSIS — M2020 Hallux rigidus, unspecified foot: Secondary | ICD-10-CM

## 2014-03-28 ENCOUNTER — Telehealth: Payer: Self-pay | Admitting: *Deleted

## 2014-03-28 NOTE — Telephone Encounter (Signed)
He gave me a prescription for an antibiotic it says take one every 6 hours then it says 4 times a day but there is only 10 tablets.  How am I supposed to take it?  I asked Dr. Blenda Mounts and he said to tell her to take it every 6 hours.  She then asked how long should she continue to throw up.  I told her it's probably the anesthesia.  She said she just ate some yogurt and everything is good so far.  I told her to let us know if she continues to vomit.  She stated she will because she don't like throwing up.  I told her I understand.

## 2014-03-29 ENCOUNTER — Telehealth: Payer: Self-pay

## 2014-03-29 NOTE — Telephone Encounter (Signed)
Spoke with pt regarding post op status. Pain is being managed well with schedule pain medications. Advised to ice and elevated and rest. Pt to f/u with scheduled appointment

## 2014-03-30 ENCOUNTER — Telehealth: Payer: Self-pay | Admitting: *Deleted

## 2014-03-30 NOTE — Telephone Encounter (Signed)
Pt called states her foot is throbbing and the pain medicine is not helping.  Pt states she is taking Oxycodone every 4 hours and Ibuprofen between those doses.  I instructed pt to remove the ace only and elevate the foot 15 minutes then rewrap the ace looser.  Pt agreed.

## 2014-04-04 ENCOUNTER — Ambulatory Visit (INDEPENDENT_AMBULATORY_CARE_PROVIDER_SITE_OTHER): Payer: Managed Care, Other (non HMO)

## 2014-04-04 VITALS — BP 133/84 | HR 92 | Resp 15 | Ht 65.0 in | Wt 160.0 lb

## 2014-04-04 DIAGNOSIS — Z09 Encounter for follow-up examination after completed treatment for conditions other than malignant neoplasm: Secondary | ICD-10-CM

## 2014-04-04 DIAGNOSIS — M2010 Hallux valgus (acquired), unspecified foot: Secondary | ICD-10-CM

## 2014-04-04 NOTE — Patient Instructions (Signed)
ICE INSTRUCTIONS  Apply ice or cold pack to the affected area at least 3 times a day for 10-15 minutes each time.  You should also use ice after prolonged activity or vigorous exercise.  Do not apply ice longer than 20 minutes at one time.  Always keep a cloth between your skin and the ice pack to prevent burns.  Being consistent and following these instructions will help control your symptoms.  We suggest you purchase a gel ice pack because they are reusable and do bit leak.  Some of them are designed to wrap around the area.  Use the method that works best for you.  Here are some other suggestions for icing.   Use a frozen bag of peas or corn-inexpensive and molds well to your body, usually stays frozen for 10 to 20 minutes.  Wet a towel with cold water and squeeze out the excess until it's damp.  Place in a bag in the freezer for 20 minutes. Then remove and use. 

## 2014-04-04 NOTE — Progress Notes (Signed)
   Subjective:    Patient ID: [name], female    DOB: [birth year], 53 y.o.   MRN: [id number]  HPI Comments: Pt states she still has some throbbing in the right foot at night, but it is getting better, even though she can't get good sleep.  DOS 03/27/14 BUNIONECTOMY AND CHEILECTOMY RT FOOT.   Review of Systems many systemic changes or findings are noted     Objective:   Physical Exam Neurovascular status is intact pedal pulses are palpable epicritic and proprioceptive sensations intact and symmetric patient has been walking want her heels inside of her foot which is evident on x-ray the osteotomy appears to be well coapted K wire intact nondisplaced mild edema and ecchymosis consistent with postop course. Good range of motion approximately 2530 dorsiflexion 5 plantar flexion although somewhat guarded by the patient she is bruises therein because of walking or fighting against her bandages keep her toe up in the air. Ice to walk normally without holding the toe up not to walk on the side of her heel or the side of her foot.       Assessment & Plan:  Assessment good postop progress noted mild edema noted mild ecchymosis noted patient is redressed and dressed a compressive dressing instructions for daily exercising of the great toe suggested 100 toe pushup divided throughout the day whenever possible also ankle is dispensed dispensed to maintain compression maintain anklet or a day leave it off at night reappointed 4 weeks for plan for followup x-ray and likely fracture cast removal at that time.  Harriet Masson DPM

## 2014-04-10 ENCOUNTER — Telehealth: Payer: Self-pay | Admitting: *Deleted

## 2014-04-10 NOTE — Telephone Encounter (Signed)
Supposed to take bandages off today.   He said I could take a shower.  Do I need to put something on it afterwards?  I returned her call.  She stated she thinks she did everything that he suggested.  She asked if she is supposed to wear the anklet at all times.  I told her just during the day, please take it off before you go to bed.  She asked if the swelling was normal.  I told her yes it is normal to still have swelling.  I informed her that's what the anklet is for.  She stated that's all she needs to know.

## 2014-05-02 ENCOUNTER — Ambulatory Visit (INDEPENDENT_AMBULATORY_CARE_PROVIDER_SITE_OTHER): Payer: Managed Care, Other (non HMO)

## 2014-05-02 VITALS — BP 102/86 | HR 86 | Resp 12

## 2014-05-02 DIAGNOSIS — M2010 Hallux valgus (acquired), unspecified foot: Secondary | ICD-10-CM

## 2014-05-02 DIAGNOSIS — M205X1 Other deformities of toe(s) (acquired), right foot: Secondary | ICD-10-CM

## 2014-05-02 DIAGNOSIS — M205X9 Other deformities of toe(s) (acquired), unspecified foot: Secondary | ICD-10-CM

## 2014-05-02 DIAGNOSIS — M2011 Hallux valgus (acquired), right foot: Secondary | ICD-10-CM

## 2014-05-02 DIAGNOSIS — Z09 Encounter for follow-up examination after completed treatment for conditions other than malignant neoplasm: Secondary | ICD-10-CM

## 2014-05-02 NOTE — Patient Instructions (Signed)
ICE INSTRUCTIONS  Apply ice or cold pack to the affected area at least 3 times a day for 10-15 minutes each time.  You should also use ice after prolonged activity or vigorous exercise.  Do not apply ice longer than 20 minutes at one time.  Always keep a cloth between your skin and the ice pack to prevent burns.  Being consistent and following these instructions will help control your symptoms.  We suggest you purchase a gel ice pack because they are reusable and do bit leak.  Some of them are designed to wrap around the area.  Use the method that works best for you.  Here are some other suggestions for icing.   Use a frozen bag of peas or corn-inexpensive and molds well to your body, usually stays frozen for 10 to 20 minutes.  Wet a towel with cold water and squeeze out the excess until it's damp.  Place in a bag in the freezer for 20 minutes. Then remove and use. 

## 2014-05-02 NOTE — Progress Notes (Signed)
   Subjective:    Patient ID: [name], female    DOB: July 11, 1961, 53 y.o.   MRN: [id number]  HPI DOS 03/27/14 BUNIONECTOMY AND CHEILECTOMY RT FOOT.  ''RT FOOT IS HURTING SO BAD.''  Review of Systems no new systemic changes or findings noted     Objective:   Physical Exam Agents five-week status post [place] bunionectomy with cheilectomy versus MTP area right foot continue with active passive range of motion exercises still some stiffness and tenderness consistent with progress patient is has mild arthropathy has not had great range of motion on of her toe joints. At this time continue with Ace anklet a new ankle is dispensed for compression at this time may discontinue boot and return to come for walking tennis or athletic shoes. Scheduled to return to work 3 weeks from now should be 8 weeks postop and she will return to work without restrictions. In the interim continue with active passive range of motion exercises recommend swimming or other activities a lot easier walking exercises to help strengthen and mobilize the great toe joint       Assessment & Plan:  Assessment good postop progress incision well coapted maintain compression return to shoes at this time discontinue boot recheck in 2 months for long-term postop followup use Advil or ibuprofen as needed for pain also recommended ice whenever possible  Harriet Masson DPM

## 2014-05-23 ENCOUNTER — Ambulatory Visit (INDEPENDENT_AMBULATORY_CARE_PROVIDER_SITE_OTHER): Payer: Managed Care, Other (non HMO)

## 2014-05-23 DIAGNOSIS — M2011 Hallux valgus (acquired), right foot: Secondary | ICD-10-CM

## 2014-05-23 DIAGNOSIS — M205X1 Other deformities of toe(s) (acquired), right foot: Secondary | ICD-10-CM

## 2014-05-23 DIAGNOSIS — M2010 Hallux valgus (acquired), unspecified foot: Secondary | ICD-10-CM

## 2014-05-23 DIAGNOSIS — M205X9 Other deformities of toe(s) (acquired), unspecified foot: Secondary | ICD-10-CM

## 2014-05-23 DIAGNOSIS — Z09 Encounter for follow-up examination after completed treatment for conditions other than malignant neoplasm: Secondary | ICD-10-CM

## 2014-05-23 NOTE — Progress Notes (Signed)
   Subjective:    Patient ID: [name], female    DOB: 1961-08-23, 53 y.o.   MRN: [id number]  HPI DOS 03/27/14.'' RT FOOT STILL SWELLING AND HAVE TINGLING ON THE TOES.''   Review of Systems no new findings or systemic changes noted     Objective:   Physical Exam Neurovascular status is intact patient proxy 2 months status post [place] bunionectomy cheilectomy first MTP area right foot. There is improved range of motion at this time although patient does still have some stiffness on plantar flexion. Her other concern is swelling especially with return to work this week. Advised that she maintain accommodative shoe athletic or clog type shoe maintain compression stocking elevated ice when possible. Neurovascular status otherwise intact other than some tingling when it swells x-rays reveal good alignment and pin fixation the osteotomy appears stable at this time       Assessment & Plan:  Good postop progress is noted mild postop edema still present continue with active and passive range of motion exercises returned all regular activities no major restrictions noted recheck in 2 months for long-term postop followup  Harriet Masson DPM

## 2014-05-23 NOTE — Patient Instructions (Signed)
ICE INSTRUCTIONS  Apply ice or cold pack to the affected area at least 3 times a day for 10-15 minutes each time.  You should also use ice after prolonged activity or vigorous exercise.  Do not apply ice longer than 20 minutes at one time.  Always keep a cloth between your skin and the ice pack to prevent burns.  Being consistent and following these instructions will help control your symptoms.  We suggest you purchase a gel ice pack because they are reusable and do bit leak.  Some of them are designed to wrap around the area.  Use the method that works best for you.  Here are some other suggestions for icing.   Use a frozen bag of peas or corn-inexpensive and molds well to your body, usually stays frozen for 10 to 20 minutes.  Wet a towel with cold water and squeeze out the excess until it's damp.  Place in a bag in the freezer for 20 minutes. Then remove and use. 

## 2014-05-31 ENCOUNTER — Encounter (HOSPITAL_COMMUNITY): Payer: Self-pay | Admitting: Emergency Medicine

## 2014-05-31 ENCOUNTER — Emergency Department (HOSPITAL_COMMUNITY)
Admission: EM | Admit: 2014-05-31 | Discharge: 2014-05-31 | Disposition: A | Discharge status: Home / Self Care | Payer: Private Health Insurance - Indemnity | Attending: Emergency Medicine | Admitting: Emergency Medicine

## 2014-05-31 ENCOUNTER — Emergency Department (HOSPITAL_COMMUNITY): Payer: Private Health Insurance - Indemnity

## 2014-05-31 DIAGNOSIS — I10 Essential (primary) hypertension: Secondary | ICD-10-CM | POA: Diagnosis not present

## 2014-05-31 DIAGNOSIS — M79672 Pain in left foot: Secondary | ICD-10-CM

## 2014-05-31 DIAGNOSIS — Z880 Allergy status to penicillin: Secondary | ICD-10-CM | POA: Insufficient documentation

## 2014-05-31 DIAGNOSIS — M25579 Pain in unspecified ankle and joints of unspecified foot: Secondary | ICD-10-CM | POA: Diagnosis not present

## 2014-05-31 MED ORDER — TRAMADOL HCL 50 MG PO TABS: 50.0000 mg | ORAL_TABLET | Freq: Four times a day (QID) | ORAL | Status: DC | PRN

## 2014-05-31 NOTE — ED Provider Notes (Signed)
I saw and evaluated the patient, reviewed the resident's note and I agree with the findings and plan.   EKG Interpretation None      53 yo female with left foot pain.  Recently had right foot operation.  On exam, well appearing, nontoxic, not distressed, normal respiratory effort, normal perfusion, left foot is TTP over dorsal surface, no erythema, swelling, or decreased ROM.  Normal pulses, normal sensation, normal perfusion.  Plan dc home with supportive treatment.  Clinical Impression: 1. Foot pain, left       Houston Siren III, MD 05/31/14 2214

## 2014-05-31 NOTE — ED Provider Notes (Signed)
CSN: [id number]     Arrival date & time 05/31/14  1320 History   First MD Initiated Contact with Patient 05/31/14 1343     Chief Complaint  Patient presents with  . Foot Pain   (Consider location/radiation/quality/duration/timing/severity/associated sxs/prior Treatment) HPI 53 year old female presents with complaints of left foot pain.  Patient reports that she developed pain on the dorsum of her left foot yesterday afternoon.  This pain slowly worsened to the point that she found it difficult to ambulate yesterday.  Pain worsened further today, prompting her to come in for evaluation.  She reports that her pain is severe.  Associated mild swelling.  No associated redness or calf pain.  No fall, trauma, or injury that she can recall.  She did have recent surgery on her right foot (bunion removal).  No relieving factors.  Pain is worse with standing and with movement.   Past Medical History  Diagnosis Date  . Hypertension    Past Surgical History  Procedure Laterality Date  . Abdominal hysterectomy    . Foot surgery     No family history on file. History  Substance Use Topics  . Smoking status: Never Smoker   . Smokeless tobacco: Not on file  . Alcohol Use: No   OB History   Grav Para Term Preterm Abortions TAB SAB Ect Mult Living                 Review of Systems Per HPI  Allergies  Penicillins  Home Medications   Prior to Admission medications   Medication Sig Start Date End Date Taking? Authorizing Provider  Estradiol Acetate (FEMRING) 0.05 MG/24HR RING Place 1 each vaginally every 30 (thirty) days.   Yes Historical Provider, MD  ibuprofen (ADVIL,MOTRIN) 200 MG tablet Take 600 mg by mouth every 6 (six) hours as needed.   Yes Historical Provider, MD  traMADol (ULTRAM) 50 MG tablet Take 1-2 tablets (50-100 mg total) by mouth every 6 (six) hours as needed. 05/31/14   Ceclia Koker G Dalvin Clipper, DO   BP 155/90  Pulse 96  Temp(Src) 98.3 F (36.8 C) (Oral)  Resp 18  Ht 5\' 6"  (1.676  m)  Wt 162 lb (73.483 kg)  BMI 26.16 kg/m2  SpO2 99% Physical Exam General: well appearing female in NAD. MSK/Extremities: Left foot & ankle - no visible erythema or swelling.  Dorsum of foot tender to palpation, particularly at the 1st metatarsal. Plantar flexion and dorsiflexion 4/5 secondary to pain.  Normal longitudinal arches. Neurovascularly intact with 2+ pulses.    ED Course  Procedures (including critical care time) Labs Review Labs Reviewed - No data to display  Imaging Review Dg Foot Complete Left  05/31/2014   CLINICAL DATA:  Left foot pain.  EXAM: LEFT FOOT - COMPLETE 3+ VIEW  COMPARISON:  02/24/2002  FINDINGS: No acute fracture or dislocation identified. No significant arthropathy. Minimal degenerative changes of the first MTP joint. No erosions or bony destruction. No bony lesions are identified. Soft tissues are unremarkable.  IMPRESSION: No acute findings. Minimal degenerative disease of the first MTP joint.   Electronically Signed   By: Aletta Edouard M.D.   On: 05/31/2014 14:32     EKG Interpretation None     MDM   Final diagnoses:  Foot pain, left  53 year old female presents with nontraumatic left foot pain. - Xrays negative. - No findings to suggest DVT, cellulitis, gout or other pathology. - Will treat with Tramadol.  Follow up closely  with PCP and Podiatry.  [facility] PGY-3     Coral Spikes, DO 05/31/14 1447

## 2014-05-31 NOTE — ED Notes (Signed)
Patient states foot started hurting bad last night (left foot).  Patient states she had foot surgery on the R foot on June 1st, and is supposed to have surgery on the L foot for "bones separating out".   Patient states that her left foot not giving her as much trouble as right until last night.   Patient denies injury to the L foot, unless "it's where I'm compensating for the right".

## 2014-05-31 NOTE — Discharge Instructions (Signed)
Your xray's are negative.    Your physical exam did not reveal a source of your pain.  Please take the medication as needed for pain.  Be sure to wear comfortable shoes.  Follow up closely with your primary doctor and podiatrist.

## 2014-06-02 ENCOUNTER — Ambulatory Visit (INDEPENDENT_AMBULATORY_CARE_PROVIDER_SITE_OTHER): Payer: Managed Care, Other (non HMO)

## 2014-06-02 VITALS — BP 137/84 | HR 91 | Resp 16

## 2014-06-02 DIAGNOSIS — M205X9 Other deformities of toe(s) (acquired), unspecified foot: Secondary | ICD-10-CM

## 2014-06-02 DIAGNOSIS — M19072 Primary osteoarthritis, left ankle and foot: Secondary | ICD-10-CM

## 2014-06-02 DIAGNOSIS — M109 Gout, unspecified: Secondary | ICD-10-CM

## 2014-06-02 DIAGNOSIS — M19079 Primary osteoarthritis, unspecified ankle and foot: Secondary | ICD-10-CM

## 2014-06-02 DIAGNOSIS — M10072 Idiopathic gout, left ankle and foot: Secondary | ICD-10-CM

## 2014-06-02 DIAGNOSIS — M205X2 Other deformities of toe(s) (acquired), left foot: Secondary | ICD-10-CM

## 2014-06-02 NOTE — Patient Instructions (Signed)
Gout °Gout is when your joints become red, sore, and swell (inflamed). This is caused by the buildup of uric acid crystals in the joints. Uric acid is a chemical that is normally in the blood. If the level of uric acid gets too high in the blood, these crystals form in your joints and tissues. Over time, these crystals can form into masses near the joints and tissues. These masses can destroy bone and cause the bone to look misshapen (deformed). °HOME CARE  °· Do not take aspirin for pain. °· Only take medicine as told by your doctor. °· Rest the joint as much as you can. When in bed, keep sheets and blankets off painful areas. °· Keep the sore joints raised (elevated). °· Put warm or cold packs on painful joints. Use of warm or cold packs depends on which works best for you. °· Use crutches if the painful joint is in your leg. °· Drink enough fluids to keep your pee (urine) clear or pale yellow. Limit alcohol, sugary drinks, and drinks with fructose in them. °· Follow your diet instructions. Pay careful attention to how much protein you eat. Include fruits, vegetables, whole grains, and fat-free or low-fat milk products in your daily diet. Talk to your doctor or dietitian about the use of coffee, vitamin C, and cherries. These may help lower uric acid levels. °· Keep a healthy body weight. °GET HELP RIGHT AWAY IF:  °· You have watery poop (diarrhea), throw up (vomit), or have any side effects from medicines. °· You do not feel better in 24 hours, or you are getting worse. °· Your joint becomes suddenly more tender, and you have chills or a fever. °MAKE SURE YOU:  °· Understand these instructions. °· Will watch your condition. °· Will get help right away if you are not doing well or get worse. °Document Released: 07/22/2008 Document Revised: 02/27/2014 Document Reviewed: 05/26/2012 °ExitCare® Patient Information ©2015 [employer]. This information is not intended to replace advice given to you by your health care  provider. Make sure you discuss any questions you have with your health care provider. ° °

## 2014-06-02 NOTE — Progress Notes (Signed)
   Subjective:    Patient ID: [name], female    DOB: 1961-06-19, 53 y.o.   MRN: [id number]  HPI Comments: "My left foot just started hurting so bad to the point I couldn't put weight on it. I went to the ER, they xrayed. He said no fractures. He put me on Tramadol 50 mg. Its some better today, but still sore."    Foot Pain      Review of Systems  Musculoskeletal: Positive for back pain and gait problem.  Allergic/Immunologic: Positive for food allergies.  All other systems reviewed and are negative.      Objective:   Physical Exam Lower extremity objective findings unchanged people is status post Austin bunionectomy and cheilectomy right foot however her left foot had a flareup she couldn't put weight or pressure in the fluid-filled warm or feverish to the touch to see the emergency room and told her may just encompass 3 inflammation from putting more pressure on her left foot because the right foot surgery however my consideration at this time she may have had a gout attack or flareup. No blood work had been done x-rays were taken reveal some dorsal spurring asymmetric joint space during first MTP joint. Neurovascular status otherwise intact pedal pulses palpable there is no pain or reproducing symptoms on palpation or range of motion at this time temperature is normal.       Assessment & Plan:  Assessment this time capsulitis and posse mild last arthropathy first MTP joint left with hallux limitus rigidus deformity noted is currently however this time it had an acute gout attack which has since resolved with the use of tramadol. Suggested monitoring if any recurrence would recommend lab workup at that time. Also dispensed literature and gout recommended hydration and keeping the foot warm. Patient at some point we'll needs surgical intervention for the hallux limitus rigidus deformity of the left foot will followup when ready on that part.  [name] DP

## 2014-07-04 ENCOUNTER — Ambulatory Visit (INDEPENDENT_AMBULATORY_CARE_PROVIDER_SITE_OTHER): Payer: Managed Care, Other (non HMO)

## 2014-07-04 VITALS — BP 139/78 | HR 77 | Resp 14 | Ht 65.0 in | Wt 170.0 lb

## 2014-07-04 DIAGNOSIS — M205X1 Other deformities of toe(s) (acquired), right foot: Secondary | ICD-10-CM

## 2014-07-04 DIAGNOSIS — M21619 Bunion of unspecified foot: Secondary | ICD-10-CM

## 2014-07-04 DIAGNOSIS — M21611 Bunion of right foot: Secondary | ICD-10-CM

## 2014-07-04 DIAGNOSIS — M205X9 Other deformities of toe(s) (acquired), unspecified foot: Secondary | ICD-10-CM

## 2014-07-04 DIAGNOSIS — Z09 Encounter for follow-up examination after completed treatment for conditions other than malignant neoplasm: Secondary | ICD-10-CM

## 2014-07-04 NOTE — Patient Instructions (Signed)
ICE INSTRUCTIONS  Apply ice or cold pack to the affected area at least 3 times a day for 10-15 minutes each time.  You should also use ice after prolonged activity or vigorous exercise.  Do not apply ice longer than 20 minutes at one time.  Always keep a cloth between your skin and the ice pack to prevent burns.  Being consistent and following these instructions will help control your symptoms.  We suggest you purchase a gel ice pack because they are reusable and do bit leak.  Some of them are designed to wrap around the area.  Use the method that works best for you.  Here are some other suggestions for icing.   Use a frozen bag of peas or corn-inexpensive and molds well to your body, usually stays frozen for 10 to 20 minutes.  Wet a towel with cold water and squeeze out the excess until it's damp.  Place in a bag in the freezer for 20 minutes. Then remove and use. 

## 2014-07-04 NOTE — Progress Notes (Signed)
   Subjective:    Patient ID: [name], female    DOB: [birth year], 53 y.o.   MRN: [id number]  HPI Comments: Pt states she is doing fine, still a little tenderness in the right 1st toe and MPJ, but otherwise walking okay.  DOS 03/27/2014 Right Austin Bunionectomy     Review of Systems no new findings or systemic changes noted     Objective:   Physical Exam Neurovascular status is intact pedal pulses are palpable incision well coapted x-rays reveal good position alignment of the osteotomy and fixation range of motion proxy 45 dorsiflexion 510 plantar flexion minimal pain or discomfort minimal edema still present consistent with postop course. At this time to examine left foot has notable bunion and hallux rigidus deformity we did dressed' before the end of the year is currently better.multiple steps up appointment for possible consult on left foot when ready       Assessment & Plan:  Assessment good postop progress following Earney Mallet first MTP joint right with hallux limitus rigidus deformity patient is status post right hallux ridges pair Austin with cheilectomy doing well incision well healed discharge an as-needed basis at this time we'll followup with ready for the contralateral left foot surgery.  Harriet Masson DPM

## 2014-07-13 ENCOUNTER — Telehealth: Payer: Self-pay | Admitting: *Deleted

## 2014-07-13 NOTE — Telephone Encounter (Signed)
I talked to Dr. Blenda Mounts on th 8th of September for a follow-up from my foot surgery.  He was telling me about surgery on my left foot.  He told me to call back when I wanted to schedule the surgery.  I'm ready to schedule the surgery, can somebody get back in touch with me?

## 2014-07-14 ENCOUNTER — Telehealth: Payer: Self-pay | Admitting: *Deleted

## 2014-07-14 NOTE — Telephone Encounter (Signed)
I left a message earlier today about you all getting back with me for my surgery.  The date I want to schedule for the surgery on my left foot.  Call me to give me a date that he can do this surgery.  My foot is giving me a lot of problems so I need to go ahead and get it done.    I spoke with the patient this afternoon and informed her she will need to schedule an appointment for a consultation, then we can schedule the surgery. 07/14/2014

## 2014-07-14 NOTE — Telephone Encounter (Signed)
I called and informed the patient that she will need to come in for a consultation first before we can schedule her for surgery.  She stated okay, that is fine.  I informed her that he has changed his surgery date to Wednesday.  I transferred her to a scheduler.

## 2014-07-21 ENCOUNTER — Ambulatory Visit (INDEPENDENT_AMBULATORY_CARE_PROVIDER_SITE_OTHER): Payer: Managed Care, Other (non HMO)

## 2014-07-21 VITALS — BP 139/87 | HR 84 | Resp 12

## 2014-07-21 DIAGNOSIS — M205X2 Other deformities of toe(s) (acquired), left foot: Secondary | ICD-10-CM

## 2014-07-21 DIAGNOSIS — M205X1 Other deformities of toe(s) (acquired), right foot: Secondary | ICD-10-CM

## 2014-07-21 DIAGNOSIS — M19079 Primary osteoarthritis, unspecified ankle and foot: Secondary | ICD-10-CM

## 2014-07-21 DIAGNOSIS — M19072 Primary osteoarthritis, left ankle and foot: Secondary | ICD-10-CM

## 2014-07-21 DIAGNOSIS — M205X9 Other deformities of toe(s) (acquired), unspecified foot: Secondary | ICD-10-CM

## 2014-07-21 NOTE — Progress Notes (Signed)
   Subjective:    Patient ID: [name], female    DOB: 04/25/1961, 53 y.o.   MRN: [id number]  HPI ''SURGERY CONSULT FOR THE LT FOOT.''   Review of Systems no new findings or systemic changes noted     Objective:   Physical Exam 50 tear left emergency no well-developed well-nourished oriented x3 presents this time for consult left foot she's four-month status post Austin with cheilectomy bunionectomy right foot with good success still some mild edema but no pain or discomfort functioning well. Left foot is ready for surgery reviewed x-rays from May of this year indicating asymmetric joint space narrowing loss of joint space is mild deviation of sesamoids to 3L ulcer first metatarsal the long first met being noted there is capsulitis and hallux rigidus/limitus deformity noted no other changes medication her health status neurovascular status is intact with pedal pulses palpable bilateral epicritic and proprioceptive sensations intact and symmetric no open wounds or ulcers no other complications noted indications for surgery       Assessment & Plan:  Assessment this time hallux limitus/rigidus deformity with HAV first MTP area left foot plan at this time consent form for Austin osteotomy and bunionectomy with chilectomy and decompression first MTP left as well as osteotomy cut in the bone with shortening decompression the bone and K wire placement. Patient extension the air fracture boot for proxy 5 weeks postop ambulatory postop only other concern is that she did have nausea after her last surgery and will probably provide antinausea medication such as Phenergan are hurting her recovery and preoperatively to all questions asked by the patient are answered at this time there no contraindications to surgery will be scheduled at her convenience at [facility] next progress Harriet Masson DPM

## 2014-07-21 NOTE — Patient Instructions (Addendum)
Pre-Operative Instructions  Congratulations, you have decided to take an important step to improving your quality of life.  You can be assured that the doctors of [facility] will be with you every step of the way.  Plan to be at the surgery center/hospital at least 1 (one) hour prior to your scheduled time unless otherwise directed by the surgical center/hospital staff.  You must have a responsible adult accompany you, remain during the surgery and drive you home.  Make sure you have directions to the surgical center/hospital and know how to get there on time. For hospital based surgery you will need to obtain a history and physical form from your family physician within 1 month prior to the date of surgery- we will give you a form for you primary physician.  We make every effort to accommodate the date you request for surgery.  There are however, times where surgery dates or times have to be moved.  We will contact you as soon as possible if a change in schedule is required.   No Aspirin/Ibuprofen for one week before surgery.  If you are on aspirin, any non-steroidal anti-inflammatory medications (Mobic, Aleve, Ibuprofen) you should stop taking it 7 days prior to your surgery.  You make take Tylenol  For pain prior to surgery.  Medications- If you are taking daily heart and blood pressure medications, seizure, reflux, allergy, asthma, anxiety, pain or diabetes medications, make sure the surgery center/hospital is aware before the day of surgery so they may notify you which medications to take or avoid the day of surgery. No food or drink after midnight the night before surgery unless directed otherwise by surgical center/hospital staff. No alcoholic beverages 24 hours prior to surgery.  No smoking 24 hours prior to or 24 hours after surgery. Wear loose pants or shorts- loose enough to fit over bandages, boots, and casts. No slip on shoes, sneakers are best. Bring your boot with you to the  surgery center/hospital.  Also bring crutches or a walker if your physician has prescribed it for you.  If you do not have this equipment, it will be provided for you after surgery. If you have not been contracted by the surgery center/hospital by the day before your surgery, call to confirm the date and time of your surgery. Leave-time from work may vary depending on the type of surgery you have.  Appropriate arrangements should be made prior to surgery with your employer. Prescriptions will be provided immediately following surgery by your doctor.  Have these filled as soon as possible after surgery and take the medication as directed. Remove nail polish on the operative foot. Wash the night before surgery.  The night before surgery wash the foot and leg well with the antibacterial soap provided and water paying special attention to beneath the toenails and in between the toes.  Rinse thoroughly with water and dry well with a towel.  Perform this wash unless told not to do so by your physician.  Enclosed: 1 Ice pack (please put in freezer the night before surgery)   1 Hibiclens skin cleaner   Pre-op Instructions  If you have any questions regarding the instructions, do not hesitate to call our office.  [place]: [place], [place] [address] [place]: [address], [place], [place] [address] [phone]  [place]: [address], [place] [address] [phone]  Dr. Kendell Bane DPM, Dr. Ila Mcgill DPM Dr. Harriet Masson DPM, Dr. Lanelle Bal DPM,  Dr. Trudie Buckler DPM

## 2014-08-07 ENCOUNTER — Telehealth: Payer: Self-pay | Admitting: *Deleted

## 2014-08-07 NOTE — Telephone Encounter (Signed)
My procedure is the 28th of October.  I had talked to the surgery center last week and they were telling me there's a new anesthesia they are going to use on this procedure than they used the last time.  I need a nurse to call me back and tell me about this new procedure, I don't know if it's new or not.  I need someone to call and tell me more about this procedure.  Call me on my work number after 1 o'clock.  Thank you.

## 2014-08-08 ENCOUNTER — Telehealth: Payer: Self-pay | Admitting: *Deleted

## 2014-08-08 NOTE — Telephone Encounter (Signed)
I got your message but my concern is I don't want to be awake.  I had a lot of nausea and vomiting after my surgery but I'd rather have that than be half awake during the procedure.  I don't want to know what is going on.  I told her I am not sure who she spoke to but Dr. Blenda Mounts has you down for IV sedation where you will be asleep.  She stated, "They told me he would just numb it up around the area and I don't just want that, I want to be asleep.  I don't want to be aware of anything that is going on."  I asked if she had a regional block last time where it stayed numb for a couple of days.  She stated, "Yes and I am fine with that.  I don't want to be aware of anything.  I'll just deal with the vomiting."  I told her to ask for something for nausea when the anesthesiologist calls a day or 2 prior to surgery.  She stated she would.  Surgery is scheduled for 08/23/2014.

## 2014-08-08 NOTE — Telephone Encounter (Signed)
I called and informed her that there is a regional leg block that the anesthesiologist does.  Helps to keep the foot numb for a couple of days after surgery.  You can call the surgical center to get the number to the anesthesiologist and ask them more questions if you'd like.

## 2014-08-23 ENCOUNTER — Telehealth: Payer: Self-pay | Admitting: *Deleted

## 2014-08-23 DIAGNOSIS — M2012 Hallux valgus (acquired), left foot: Secondary | ICD-10-CM

## 2014-08-23 DIAGNOSIS — M2022 Hallux rigidus, left foot: Secondary | ICD-10-CM

## 2014-08-23 MED ORDER — OXYCODONE-ACETAMINOPHEN 5-325 MG PO TABS: 1.0000 | ORAL_TABLET | Freq: Four times a day (QID) | ORAL | Status: DC | PRN

## 2014-08-23 NOTE — Telephone Encounter (Signed)
"  I'm a patient of Dr. Erasmo Downer.  I just had surgery a few hours ago.  My husband took my prescription to [facility] on [address] and they will not fill my prescription.  They said it's a narcotic and they will have to speak to Dr. Blenda Mounts. They said they have tried to reach him several times."  I told her I would give them a call.  I called and spoke to [place].  He stated, "Dr. Blenda Mounts forgot to sign the prescription.  We tried to explain it to the husband that he could bring it to you all to get it sign.  He stated he was headed over there.  I would have given him the hard copy to bring it to you to sign but I don't think he was understanding."  I told him I'd have it ready.  Percocet 5/325 take 1-2 by mouth with food every 6 hours prn pain, quantity of 45.  "My husband said they said they need a hard copy of my prescription."  I explained to her that Dr. Blenda Mounts forgot to sign the prescription.  They wanted to give your husband the prescription to bring it here to be sign but he left without it.  I will go and have a prescription written.  She stated, "Okay, he'll be by their to get it."  I asked his name.  She stated, "Nelida Gores"  I told her he would have to sign for the prescription and show identification to pick it up.

## 2014-08-25 ENCOUNTER — Telehealth: Payer: Self-pay | Admitting: *Deleted

## 2014-08-25 NOTE — Telephone Encounter (Signed)
I'm a patient of Dr. Blenda Mounts.  I had surgery on  Wednesday.  I need to talk to a nurse about this medicine.  I'm in a lot of pain.  Please give me a call back.  I returned her call.  She said she took 2 pain pills instead of one and she feels better.  She asked if it is okay to take it every 4 hours.  I told her that was fine.  I advised to loosen the ace bandage up only if it felt too tight.  She said it is fine.  I advised her to call the on-call pager if she has any further problems.

## 2014-08-29 ENCOUNTER — Ambulatory Visit (INDEPENDENT_AMBULATORY_CARE_PROVIDER_SITE_OTHER): Payer: Managed Care, Other (non HMO)

## 2014-08-29 VITALS — BP 134/81 | HR 82 | Resp 14

## 2014-08-29 DIAGNOSIS — M21612 Bunion of left foot: Secondary | ICD-10-CM

## 2014-08-29 DIAGNOSIS — M205X2 Other deformities of toe(s) (acquired), left foot: Secondary | ICD-10-CM

## 2014-08-29 DIAGNOSIS — Z09 Encounter for follow-up examination after completed treatment for conditions other than malignant neoplasm: Secondary | ICD-10-CM

## 2014-08-29 DIAGNOSIS — M2012 Hallux valgus (acquired), left foot: Secondary | ICD-10-CM

## 2014-08-29 DIAGNOSIS — M19072 Primary osteoarthritis, left ankle and foot: Secondary | ICD-10-CM

## 2014-08-29 NOTE — Patient Instructions (Signed)
ICE INSTRUCTIONS  Apply ice or cold pack to the affected area at least 3 times a day for 10-15 minutes each time.  You should also use ice after prolonged activity or vigorous exercise.  Do not apply ice longer than 20 minutes at one time.  Always keep a cloth between your skin and the ice pack to prevent burns.  Being consistent and following these instructions will help control your symptoms.  We suggest you purchase a gel ice pack because they are reusable and do bit leak.  Some of them are designed to wrap around the area.  Use the method that works best for you.  Here are some other suggestions for icing.   Use a frozen bag of peas or corn-inexpensive and molds well to your body, usually stays frozen for 10 to 20 minutes.  Wet a towel with cold water and squeeze out the excess until it's damp.  Place in a bag in the freezer for 20 minutes. Then remove and use. 

## 2014-08-29 NOTE — Progress Notes (Signed)
   Subjective:    Patient ID: [name], female    DOB: Sep 27, 1961, 53 y.o.   MRN: [id number]  HPI Comments: DOS 08/23/2014 Left austin bunionectomy with chilectomy.  Pt states she felt she had severe pain through the whole post-op period, but last night she had such sharp pain in the left foot she had to remove the boot, elevate the foot and begin ice again.     Review of Systemsno new findings or systemic changes noted     Objective:   Physical Exam Neurovascular status is intact pedal pulses palpable epicritic and proprioceptive sensations intact and symmetric patient admits that she's been on her foot possibly more than she should be mostly aching and throbbing occasional sharp shooting pain. Was dressing is removed had some relief from the tightness and throbbing. X-rays reveal good pus good osteotomy correction of hallux limitus rigidus with Austin cheilectomy left great toe joint the incision well coapted dry sterile compressive dressing reapplied at this time patient will maintain dressing initiate active and passive range of motion exercises. Reappointed in one month for long-term follow-up and follow-up x-rays or 4 weeks from now 1 week from now may discontinue dressing may remove dressings and resume normal bathing and hygiene however will maintain surgical boot as instructed       Assessment & Plan:  Assessment good postop progress patient is status post Austin bunionectomy cheilectomy for hallux limitus rigidus left great toe joint patient is responding well may have been a little too active up on her feet too long causing some swelling and edema and neuritis patient will continue monitor rare exam is unremarkable at this time mild edema and ecchymosis consistent with postop course recheck in 4 weeks for long-term postop follow-up   Harriet Masson DPM

## 2014-09-12 DIAGNOSIS — M79673 Pain in unspecified foot: Secondary | ICD-10-CM

## 2014-09-14 ENCOUNTER — Other Ambulatory Visit: Payer: Self-pay

## 2014-09-14 DIAGNOSIS — Z1231 Encounter for screening mammogram for malignant neoplasm of breast: Secondary | ICD-10-CM

## 2014-09-26 ENCOUNTER — Ambulatory Visit (INDEPENDENT_AMBULATORY_CARE_PROVIDER_SITE_OTHER): Payer: Managed Care, Other (non HMO)

## 2014-09-26 VITALS — BP 112/78 | HR 74 | Resp 17

## 2014-09-26 DIAGNOSIS — Z9889 Other specified postprocedural states: Secondary | ICD-10-CM

## 2014-09-26 NOTE — Patient Instructions (Signed)
ICE INSTRUCTIONS  Apply ice or cold pack to the affected area at least 3 times a day for 10-15 minutes each time.  You should also use ice after prolonged activity or vigorous exercise.  Do not apply ice longer than 20 minutes at one time.  Always keep a cloth between your skin and the ice pack to prevent burns.  Being consistent and following these instructions will help control your symptoms.  We suggest you purchase a gel ice pack because they are reusable and do bit leak.  Some of them are designed to wrap around the area.  Use the method that works best for you.  Here are some other suggestions for icing.   Use a frozen bag of peas or corn-inexpensive and molds well to your body, usually stays frozen for 10 to 20 minutes.  Wet a towel with cold water and squeeze out the excess until it's damp.  Place in a bag in the freezer for 20 minutes. Then remove and use. 

## 2014-09-26 NOTE — Progress Notes (Signed)
   Subjective:    Patient ID: [name], female    DOB: 1961/02/21, 53 y.o.   MRN: [id number]  HPI  Pt presents as post op left bunion repair 08/23/14. Dosing well still has some swelling and pain with prolongede activity  Review of Systemsno new findings or systemic changes      Objective:   Physical Exam Patient is 5 weeks status post Austin bunionectomy for left foot has continue doing range of motion exercises that have some hallux limitus rigidus deformity his proximally 3035 dorsiflexion 510 plantar flexion and no significant pain or crepitus mild edema still present consistent with postop course x-rays reveal good alignment of the osteotomy intact K wire fixations no displacements noted. Plan at this time patient may discontinue boot and return to come for walking tennis or athletic shoe no barefoot no flimsy shoes or flip-flops. Patient also can return this coming Monday for 3 weeks. 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       Assessment & Plan:  Assessment good postop progress no dehiscence no signs of infection good range of motion noted at this time continue range of motion exercises return to work light duty for 3 weeks and then regular duty thereafter discontinue boot and resume normal but normal shoes are athletic shoes or sandals and continue range of motion exercising two-month long-term postop follow-up is recommended at this time next  [employer] DPM

## 2014-10-05 ENCOUNTER — Ambulatory Visit
Admission: RE | Admit: 2014-10-05 | Discharge: 2014-10-05 | Disposition: A | Discharge status: Home / Self Care | Payer: Private Health Insurance - Indemnity | Source: Ambulatory Visit

## 2014-10-05 DIAGNOSIS — Z1231 Encounter for screening mammogram for malignant neoplasm of breast: Secondary | ICD-10-CM

## 2014-11-28 ENCOUNTER — Ambulatory Visit: Payer: Managed Care, Other (non HMO)

## 2015-01-02 ENCOUNTER — Ambulatory Visit: Payer: 59

## 2015-01-02 ENCOUNTER — Ambulatory Visit (INDEPENDENT_AMBULATORY_CARE_PROVIDER_SITE_OTHER): Payer: 59

## 2015-01-02 VITALS — BP 131/74 | HR 90 | Resp 18

## 2015-01-02 DIAGNOSIS — Z09 Encounter for follow-up examination after completed treatment for conditions other than malignant neoplasm: Secondary | ICD-10-CM

## 2015-01-02 DIAGNOSIS — M19072 Primary osteoarthritis, left ankle and foot: Secondary | ICD-10-CM

## 2015-01-02 DIAGNOSIS — M205X2 Other deformities of toe(s) (acquired), left foot: Secondary | ICD-10-CM

## 2015-01-02 DIAGNOSIS — M2012 Hallux valgus (acquired), left foot: Secondary | ICD-10-CM

## 2015-01-02 DIAGNOSIS — M21612 Bunion of left foot: Secondary | ICD-10-CM

## 2015-01-02 NOTE — Progress Notes (Signed)
   Subjective:    Patient ID: [name], female    DOB: December 15, 1960, 54 y.o.   MRN: [id number]  HPI this past weekend i felt the tightness on my left foot but overall i am doing good    Review of Systemsno new findings or systemic changes noted     Objective:   Physical Exam Neurovascular status is intact incision clean dry well coapted patient is just over 4 months status post [place] bunionectomy left foot with cheilectomy good range of motion still only about 30-40 dorsiflexion however that matches the lateral digits as well patient is not the most flexible foot type still some mild +1 edema noted patient is return to work does 10 hour shifts been on her foot and does a lot of sedentary activity or stationary standing would benefit from compression stockings in the future. Neurovascular status intact and well-healed incision site x-rays revealed good position the osteotomy intact fixation of K wires no displacements good range of motion noted       Assessment & Plan:  Assessment good postop progress following [place] bunionectomy, appendectomy first MTP area left foot patient discharged to an as-needed basis for follow-up continue with active passive range of motion exercises recommended compression stocking with help with the tightness and edema in the future patient advised swelling can last surgery based on activity levels patient discharged from postop care at this time   Harriet Masson DPM

## 2015-01-02 NOTE — Patient Instructions (Signed)
Edema Edema is an abnormal buildup of fluids in your bodytissues. Edema is somewhatdependent on gravity to pull the fluid to the lowest place in your body. That makes the condition more common in the legs and thighs (lower extremities). Painless swelling of the feet and ankles is common and becomes more likely as you get older. It is also common in looser tissues, like around your eyes.  When the affected area is squeezed, the fluid may move out of that spot and leave a dent for a few moments. This dent is called pitting.  CAUSES  There are many possible causes of edema. Eating too much salt and being on your feet or sitting for a long time can cause edema in your legs and ankles. Hot weather may make edema worse. Common medical causes of edema include:  Heart failure.  Liver disease.  Kidney disease.  Weak blood vessels in your legs.  Cancer.  An injury.  Pregnancy.  Some medications.  Obesity. SYMPTOMS  Edema is usually painless.Your skin may look swollen or shiny.  DIAGNOSIS  Your health care provider may be able to diagnose edema by asking about your medical history and doing a physical exam. You may need to have tests such as X-rays, an electrocardiogram, or blood tests to check for medical conditions that may cause edema.  TREATMENT  Edema treatment depends on the cause. If you have heart, liver, or kidney disease, you need the treatment appropriate for these conditions. General treatment may include:  Elevation of the affected body part above the level of your heart.  Compression of the affected body part. Pressure from elastic bandages or support stockings squeezes the tissues and forces fluid back into the blood vessels. This keeps fluid from entering the tissues.  Restriction of fluid and salt intake.  Use of a water pill (diuretic). These medications are appropriate only for some types of edema. They pull fluid out of your body and make you urinate more often. This  gets rid of fluid and reduces swelling, but diuretics can have side effects. Only use diuretics as directed by your health care provider. HOME CARE INSTRUCTIONS   Keep the affected body part above the level of your heart when you are lying down.   Do not sit still or stand for prolonged periods.   Do not put anything directly under your knees when lying down.  Do not wear constricting clothing or garters on your upper legs.   Exercise your legs to work the fluid back into your blood vessels. This may help the swelling go down.   Wear elastic bandages or support stockings to reduce ankle swelling as directed by your health care provider.   Eat a low-salt diet to reduce fluid if your health care provider recommends it.   Only take medicines as directed by your health care provider. SEEK MEDICAL CARE IF:   Your edema is not responding to treatment.  You have heart, liver, or kidney disease and notice symptoms of edema.  You have edema in your legs that does not improve after elevating them.   You have sudden and unexplained weight gain. SEEK IMMEDIATE MEDICAL CARE IF:   You develop shortness of breath or chest pain.   You cannot breathe when you lie down.  You develop pain, redness, or warmth in the swollen areas.   You have heart, liver, or kidney disease and suddenly get edema.  You have a fever and your symptoms suddenly get worse. MAKE SURE YOU:     Understand these instructions.  Will watch your condition.  Will get help right away if you are not doing well or get worse. Document Released: 10/13/2005 Document Revised: 02/27/2014 Document Reviewed: 08/05/2013 [facility] Patient Information 2015 [place], [place]. This information is not intended to replace advice given to you by your health care provider. Make sure you discuss any questions you have with your health care provider.   Recommend wearing mild compression stockings 15-20 mm compression, especially  having a job where your sedentary or standing for long periods of time. This will help to reduce swelling and tightness in the feet and legs

## 2015-04-16 ENCOUNTER — Inpatient Hospital Stay (HOSPITAL_COMMUNITY)
Admission: EM | Admit: 2015-04-16 | Discharge: 2015-04-19 | DRG: 287 | Disposition: A | Discharge status: Home / Self Care | Payer: 59 | Attending: Cardiology | Admitting: Cardiology

## 2015-04-16 ENCOUNTER — Emergency Department (HOSPITAL_COMMUNITY): Payer: 59

## 2015-04-16 ENCOUNTER — Encounter (HOSPITAL_COMMUNITY): Payer: Self-pay | Admitting: *Deleted

## 2015-04-16 DIAGNOSIS — Z79899 Other long term (current) drug therapy: Secondary | ICD-10-CM

## 2015-04-16 DIAGNOSIS — Z880 Allergy status to penicillin: Secondary | ICD-10-CM

## 2015-04-16 DIAGNOSIS — I10 Essential (primary) hypertension: Secondary | ICD-10-CM | POA: Diagnosis present

## 2015-04-16 DIAGNOSIS — R079 Chest pain, unspecified: Secondary | ICD-10-CM | POA: Diagnosis present

## 2015-04-16 DIAGNOSIS — E780 Pure hypercholesterolemia: Secondary | ICD-10-CM | POA: Diagnosis present

## 2015-04-16 DIAGNOSIS — R0789 Other chest pain: Principal | ICD-10-CM | POA: Diagnosis present

## 2015-04-16 DIAGNOSIS — E8941 Symptomatic postprocedural ovarian failure: Secondary | ICD-10-CM | POA: Diagnosis present

## 2015-04-16 DIAGNOSIS — Z8249 Family history of ischemic heart disease and other diseases of the circulatory system: Secondary | ICD-10-CM

## 2015-04-16 LAB — COMPREHENSIVE METABOLIC PANEL
ALBUMIN: 4 g/dL (ref 3.5–5.0)
ALT: 22 U/L (ref 14–54)
AST: 23 U/L (ref 15–41)
Alkaline Phosphatase: 63 U/L (ref 38–126)
Anion gap: 9 (ref 5–15)
BILIRUBIN TOTAL: 0.6 mg/dL (ref 0.3–1.2)
BUN: 9 mg/dL (ref 6–20)
CO2: 28 mmol/L (ref 22–32)
CREATININE: 0.91 mg/dL (ref 0.44–1.00)
Calcium: 9.4 mg/dL (ref 8.9–10.3)
Chloride: 101 mmol/L (ref 101–111)
GFR calc Af Amer: >60 mL/min (ref >60)
Glucose, Bld: 101 mg/dL — ABNORMAL HIGH (ref 65–99)
POTASSIUM: 3.9 mmol/L (ref 3.5–5.1)
Sodium: 138 mmol/L (ref 135–145)
Total Protein: 7.5 g/dL (ref 6.5–8.1)

## 2015-04-16 LAB — I-STAT TROPONIN, ED: TROPONIN I, POC: 0 ng/mL (ref 0.00–0.08)

## 2015-04-16 LAB — CBC
HEMATOCRIT: 39.4 % (ref 36.0–46.0)
Hemoglobin: 13.3 g/dL (ref 12.0–15.0)
MCH: 29.9 pg (ref 26.0–34.0)
MCHC: 33.8 g/dL (ref 30.0–36.0)
MCV: 88.5 fL (ref 78.0–100.0)
Platelets: 295 10*3/uL (ref 150–400)
RBC: 4.45 MIL/uL (ref 3.87–5.11)
RDW: 13.1 % (ref 11.5–15.5)
WBC: 6.1 10*3/uL (ref 4.0–10.5)

## 2015-04-16 LAB — CBC WITH DIFFERENTIAL/PLATELET
BASOS ABS: 0 10*3/uL (ref 0.0–0.1)
Basophils Relative: 1 % (ref 0–1)
Eosinophils Absolute: 0.1 10*3/uL (ref 0.0–0.7)
Eosinophils Relative: 1 % (ref 0–5)
HEMATOCRIT: 38.3 % (ref 36.0–46.0)
HEMOGLOBIN: 13.1 g/dL (ref 12.0–15.0)
LYMPHS ABS: 2.9 10*3/uL (ref 0.7–4.0)
LYMPHS PCT: 48 % — AB (ref 12–46)
MCH: 29.9 pg (ref 26.0–34.0)
MCHC: 34.2 g/dL (ref 30.0–36.0)
MCV: 87.4 fL (ref 78.0–100.0)
Monocytes Absolute: 0.4 10*3/uL (ref 0.1–1.0)
Monocytes Relative: 6 % (ref 3–12)
NEUTROS ABS: 2.7 10*3/uL (ref 1.7–7.7)
NEUTROS PCT: 44 % (ref 43–77)
PLATELETS: 295 10*3/uL (ref 150–400)
RBC: 4.38 MIL/uL (ref 3.87–5.11)
RDW: 12.9 % (ref 11.5–15.5)
WBC: 6.1 10*3/uL (ref 4.0–10.5)

## 2015-04-16 LAB — BASIC METABOLIC PANEL
Anion gap: 8 (ref 5–15)
BUN: 12 mg/dL (ref 6–20)
CHLORIDE: 102 mmol/L (ref 101–111)
CO2: 27 mmol/L (ref 22–32)
Calcium: 9.2 mg/dL (ref 8.9–10.3)
Creatinine, Ser: 0.88 mg/dL (ref 0.44–1.00)
GFR calc Af Amer: >60 mL/min (ref >60)
GFR calc non Af Amer: >60 mL/min (ref >60)
Glucose, Bld: 92 mg/dL (ref 65–99)
POTASSIUM: 3.6 mmol/L (ref 3.5–5.1)
SODIUM: 137 mmol/L (ref 135–145)

## 2015-04-16 LAB — MAGNESIUM: Magnesium: 1.8 mg/dL (ref 1.7–2.4)

## 2015-04-16 LAB — BRAIN NATRIURETIC PEPTIDE: B NATRIURETIC PEPTIDE 5: 11.9 pg/mL (ref 0.0–100.0)

## 2015-04-16 LAB — TSH: TSH: 3.66 u[IU]/mL (ref 0.350–4.500)

## 2015-04-16 LAB — TROPONIN I

## 2015-04-16 LAB — D-DIMER, QUANTITATIVE (NOT AT ARMC)

## 2015-04-16 MED ORDER — METOPROLOL TARTRATE 25 MG PO TABS
25.0000 mg | ORAL_TABLET | Freq: Two times a day (BID) | ORAL | Status: DC
  Administered 2015-04-16 – 2015-04-19 (×6): 25 mg via ORAL
  Filled 2015-04-16 (×6): qty 1

## 2015-04-16 MED ORDER — SODIUM CHLORIDE 0.9 % IV SOLN
INTRAVENOUS | Status: DC
  Administered 2015-04-16: 21:00:00 via INTRAVENOUS

## 2015-04-16 MED ORDER — NITROGLYCERIN 0.4 MG SL SUBL
0.4000 mg | SUBLINGUAL_TABLET | SUBLINGUAL | Status: AC | PRN
  Administered 2015-04-16 (×3): 0.4 mg via SUBLINGUAL
  Filled 2015-04-16: qty 1

## 2015-04-16 MED ORDER — ASPIRIN 81 MG PO CHEW: 324.0000 mg | CHEWABLE_TABLET | ORAL | Status: AC

## 2015-04-16 MED ORDER — ASPIRIN 300 MG RE SUPP: 300.0000 mg | RECTAL | Status: AC

## 2015-04-16 MED ORDER — HEPARIN SODIUM (PORCINE) 5000 UNIT/ML IJ SOLN
5000.0000 [IU] | Freq: Three times a day (TID) | INTRAMUSCULAR | Status: DC
  Administered 2015-04-16 – 2015-04-18 (×5): 5000 [IU] via SUBCUTANEOUS
  Filled 2015-04-16 (×5): qty 1

## 2015-04-16 MED ORDER — ATORVASTATIN CALCIUM 20 MG PO TABS
20.0000 mg | ORAL_TABLET | Freq: Every day | ORAL | Status: DC
  Administered 2015-04-17 – 2015-04-18 (×2): 20 mg via ORAL
  Filled 2015-04-16 (×2): qty 1

## 2015-04-16 MED ORDER — ONDANSETRON HCL 4 MG/2ML IJ SOLN
4.0000 mg | Freq: Four times a day (QID) | INTRAMUSCULAR | Status: DC | PRN
  Administered 2015-04-16 – 2015-04-17 (×2): 4 mg via INTRAVENOUS
  Filled 2015-04-16 (×3): qty 2

## 2015-04-16 MED ORDER — NITROGLYCERIN 2 % TD OINT
0.5000 [in_us] | TOPICAL_OINTMENT | Freq: Four times a day (QID) | TRANSDERMAL | Status: DC
  Administered 2015-04-16 – 2015-04-17 (×5): 0.5 [in_us] via TOPICAL
  Filled 2015-04-16: qty 30

## 2015-04-16 MED ORDER — PANTOPRAZOLE SODIUM 40 MG PO PACK
40.0000 mg | PACK | Freq: Every day | ORAL | Status: DC
  Administered 2015-04-16 – 2015-04-18 (×3): 40 mg
  Filled 2015-04-16 (×4): qty 20

## 2015-04-16 MED ORDER — ALPRAZOLAM 0.25 MG PO TABS
0.2500 mg | ORAL_TABLET | Freq: Two times a day (BID) | ORAL | Status: DC | PRN
  Administered 2015-04-16 – 2015-04-18 (×2): 0.25 mg via ORAL
  Filled 2015-04-16 (×2): qty 1

## 2015-04-16 MED ORDER — NITROGLYCERIN 0.4 MG SL SUBL: 0.4000 mg | SUBLINGUAL_TABLET | SUBLINGUAL | Status: DC | PRN

## 2015-04-16 MED ORDER — ASPIRIN EC 81 MG PO TBEC
81.0000 mg | DELAYED_RELEASE_TABLET | Freq: Every day | ORAL | Status: DC
  Administered 2015-04-17 – 2015-04-19 (×2): 81 mg via ORAL
  Filled 2015-04-16 (×2): qty 1

## 2015-04-16 MED ORDER — ASPIRIN 81 MG PO CHEW
324.0000 mg | CHEWABLE_TABLET | Freq: Once | ORAL | Status: AC
  Administered 2015-04-16: 324 mg via ORAL
  Filled 2015-04-16: qty 4

## 2015-04-16 MED ORDER — ACETAMINOPHEN 325 MG PO TABS
650.0000 mg | ORAL_TABLET | ORAL | Status: DC | PRN
  Administered 2015-04-16 – 2015-04-17 (×6): 650 mg via ORAL
  Filled 2015-04-16 (×6): qty 2

## 2015-04-16 NOTE — ED Notes (Signed)
Pt became tearful and sts pain is getting worse.  Pt sts pain is now a 10/10.  Sts this is how it has been.  She gets strong intermittent pains as well as a general 8/10 pain residual.

## 2015-04-16 NOTE — ED Notes (Signed)
Pt sts she had surgery on her left foot (bunion) approx 6 months ago.  Sts she began to have leg pain in her left leg intermittently approx 3-4 days ago.  Then yesterday she began to have chest pain in her central chest.  When it began she became SOB and nauseous.  Sts the pain has been getting progessively worse throughout work today, and it hurts the worst when she takes a deep breath.  Approx 1 month ago she took a car trip to [place].

## 2015-04-16 NOTE — ED Notes (Addendum)
Pt states that she has chest pain and SOB that started last night and has worsened. Pt states that she had surgery to her left leg about 6 months ago and has pain/burning to her foot and calf. Pain is worse with breathing.

## 2015-04-16 NOTE — H&P (Signed)
[name] is an 54 y.o. female.   Chief Complaint: Recurrent chest pain HPI: Patient is 54 year old female with past medical history significant for hypertension, hypercholesterolemia, strong family history of coronary artery disease, brother had MI in his 74s mother died of MI in her 44s, surgical menopause, came to the ER complaining of recurrent retrosternal chest pain described as tightness grade 8-10 over 10 off and on since Saturday patient received sublingual nitroglycerin with partial relief states also chest pain increases with deep breathing occasionally travels to left arm associated with nausea. Patient denies any exertional chest pain denies any cardiac workup recently. Denies any cough fever. EKG done in the ED showed normal sinus rhythm with nonspecific T waves no acute ischemic changes are noted first set of troponin I is negative patient had d-dimer and also BNP which were normal. Patient denies any relation of chest pain to food. Denies any palpitation lightheadedness or syncope.  Past Medical History  Diagnosis Date  . Hypertension     Past Surgical History  Procedure Laterality Date  . Abdominal hysterectomy    . Foot surgery      No family history on file. Social History:  reports that she has never smoked. She does not have any smokeless tobacco history on file. She reports that she does not drink alcohol or use illicit drugs.  Allergies:  Allergies  Allergen Reactions  . Penicillins Anaphylaxis and Hives    Medications Prior to Admission  Medication Sig Dispense Refill  . atorvastatin (LIPITOR) 20 MG tablet Take 20 mg by mouth daily at 6 PM.    . Cholecalciferol (VITAMIN D PO) Take 1 tablet by mouth daily.    . Estradiol Acetate (FEMRING) 0.05 MG/24HR RING Place 1 each vaginally every 3 (three) months.     [name] ibuprofen (ADVIL,MOTRIN) 200 MG tablet Take 400 mg by mouth every 6 (six) hours as needed for mild pain or moderate pain.     [name]  oxyCODONE-acetaminophen (ROXICET) 5-325 MG per tablet Take 1-2 tablets by mouth every 6 (six) hours as needed for severe pain. With food (Patient not taking: Reported on 04/16/2015) 45 tablet 0  . traMADol (ULTRAM) 50 MG tablet Take 1-2 tablets (50-100 mg total) by mouth every 6 (six) hours as needed. (Patient not taking: Reported on 04/16/2015) 30 tablet 0    Results for orders placed or performed during the hospital encounter of 04/16/15 (from the past 48 hour(s))  CBC     Status: None   Collection Time: 04/16/15  4:27 PM  Result Value Ref Range   WBC 6.1 4.0 - 10.5 K/uL   RBC 4.45 3.87 - 5.11 MIL/uL   Hemoglobin 13.3 12.0 - 15.0 g/dL   HCT 39.4 36.0 - 46.0 %   MCV 88.5 78.0 - 100.0 fL   MCH 29.9 26.0 - 34.0 pg   MCHC 33.8 30.0 - 36.0 g/dL   RDW 13.1 11.5 - 15.5 %   Platelets 295 150 - 400 K/uL  Basic metabolic panel     Status: None   Collection Time: 04/16/15  4:27 PM  Result Value Ref Range   Sodium 137 135 - 145 mmol/L   Potassium 3.6 3.5 - 5.1 mmol/L   Chloride 102 101 - 111 mmol/L   CO2 27 22 - 32 mmol/L   Glucose, Bld 92 65 - 99 mg/dL   BUN 12 6 - 20 mg/dL   Creatinine, Ser 0.88 0.44 - 1.00 mg/dL   Calcium 9.2 8.9 - 10.3  mg/dL   GFR calc non Af Amer >60 >60 mL/min   GFR calc Af Amer >60 >60 mL/min    Comment: (NOTE) The eGFR has been calculated using the CKD EPI equation. This calculation has not been validated in all clinical situations. eGFR's persistently <60 mL/min signify possible Chronic Kidney Disease.    Anion gap 8 5 - 15  BNP (order ONLY if patient complains of dyspnea/SOB AND you have documented it for THIS visit)     Status: None   Collection Time: 04/16/15  4:27 PM  Result Value Ref Range   B Natriuretic Peptide 11.9 0.0 - 100.0 pg/mL  I-stat troponin, ED  (not at [facility])     Status: None   Collection Time: 04/16/15  4:38 PM  Result Value Ref Range   Troponin i, poc 0.00 0.00 - 0.08 ng/mL   Comment 3            Comment: Due to the release kinetics  of cTnI, a negative result within the first hours of the onset of symptoms does not rule out myocardial infarction with certainty. If myocardial infarction is still suspected, repeat the test at appropriate intervals.   D-dimer, quantitative (not at [facility])     Status: None   Collection Time: 04/16/15  5:40 PM  Result Value Ref Range   D-Dimer, Quant <0.27 0.00 - 0.48 ug/mL-FEU    Comment:        AT THE INHOUSE ESTABLISHED CUTOFF VALUE OF 0.48 ug/mL FEU, THIS ASSAY HAS BEEN DOCUMENTED IN THE LITERATURE TO HAVE A SENSITIVITY AND NEGATIVE PREDICTIVE VALUE OF AT LEAST 98 TO 99%.  THE TEST RESULT SHOULD BE CORRELATED WITH AN ASSESSMENT OF THE CLINICAL PROBABILITY OF DVT / VTE.    Dg Chest 2 View  04/16/2015   CLINICAL DATA:  Chest pain and shortness of breath beginning last night, has worsened today with increased shortness of breath and tight heavy mid sternal chest pain. Pain with breathing. Surgery on LEFT foot 6 months ago.  EXAM: CHEST  2 VIEW  COMPARISON:  04/24/2009  FINDINGS: Normal heart size, mediastinal contours, and pulmonary vascularity.  Lungs clear.  No pleural effusion or pneumothorax.  Bones unremarkable.  IMPRESSION: No acute abnormalities.   Electronically Signed   By: Lavonia Dana M.D.   On: 04/16/2015 17:25    Review of Systems  Constitutional: Negative for fever and malaise/fatigue.  Eyes: Negative for double vision and photophobia.  Respiratory: Negative for cough, hemoptysis, sputum production and shortness of breath.   Cardiovascular: Positive for chest pain. Negative for palpitations, orthopnea, claudication and leg swelling.  Gastrointestinal: Positive for nausea. Negative for vomiting and abdominal pain.  Genitourinary: Negative for dysuria.  Neurological: Negative for dizziness and headaches.    Blood pressure 182/85, pulse 76, temperature 97.5 F (36.4 C), temperature source Oral, resp. rate 13, height _0  (1.651 m), weight 76.204 kg (168 lb), SpO2 100  %. Physical Exam  Constitutional: She is oriented to person, place, and time.  Eyes: Conjunctivae are normal. Pupils are equal, round, and reactive to light. Left eye exhibits no discharge. No scleral icterus.  Neck: Normal range of motion. Neck supple. No thyromegaly present.  Cardiovascular: Normal rate and regular rhythm.   Murmur (Soft systolic murmur noted no S3 gallop) heard. Respiratory: Effort normal and breath sounds normal. No respiratory distress. She has no wheezes. She has no rales.  GI: Bowel sounds are normal. She exhibits no distension. There is no tenderness.  Musculoskeletal:  She exhibits no edema or tenderness.  Neurological: She is alert and oriented to person, place, and time.     Assessment/Plan Atypical chest pain with some features worrisome for angina rule out on her insufficiency rule out MI Hypertension Hypercholesteremia Surgical menopause Strong family history of coronary artery disease  Status post recent bilateral foot bunion surgery Plan As per orders  [name] 04/16/2015, 8:17 PM

## 2015-04-16 NOTE — ED Provider Notes (Signed)
CSN: [id number]     Arrival date & time 04/16/15  1615 History   First MD Initiated Contact with Patient 04/16/15 1733     Chief Complaint  Patient presents with  . Chest Pain  . Shortness of Breath     (Consider location/radiation/quality/duration/timing/severity/associated sxs/prior Treatment) HPI  54 year old female presents with chest pain that has been ongoing since yesterday. Has been gradually worsening. Hurts in the middle of her chest and does hurt worse with inspiration. Patient has lso had a mild cough since today. No shortness of breath currently but hurts worse to breathe. Patient has had cramping in her left calf but no leg swelling. Patient denies diaphoresis. Coughing and inspiration make the chest pain worse but otherwise pain does not get better or worse when walking, changing position, or at rest. Rates her pain as a 10/10. Feels mostly like a heaviness.  Past Medical History  Diagnosis Date  . Hypertension    Past Surgical History  Procedure Laterality Date  . Abdominal hysterectomy    . Foot surgery     No family history on file. History  Substance Use Topics  . Smoking status: Never Smoker   . Smokeless tobacco: Not on file  . Alcohol Use: No   OB History    No data available     Review of Systems  Constitutional: Negative for fever.  Respiratory: Positive for cough. Negative for shortness of breath.   Cardiovascular: Positive for chest pain. Negative for leg swelling.  Gastrointestinal: Negative for nausea and vomiting.  Musculoskeletal: Positive for myalgias.  All other systems reviewed and are negative.     Allergies  Penicillins  Home Medications   Prior to Admission medications   Medication Sig Start Date End Date Taking? Authorizing Provider  atorvastatin (LIPITOR) 20 MG tablet Take 20 mg by mouth daily at 6 PM.   Yes Historical Provider, MD  Cholecalciferol (VITAMIN D PO) Take 1 tablet by mouth daily.   Yes Historical Provider, MD    Estradiol Acetate ([place]) 0.05 MG/24HR RING Place 1 each vaginally every 3 (three) months.    Yes Historical Provider, MD  ibuprofen (ADVIL,MOTRIN) 200 MG tablet Take 400 mg by mouth every 6 (six) hours as needed for mild pain or moderate pain.    Yes Historical Provider, MD  oxyCODONE-acetaminophen (ROXICET) 5-325 MG per tablet Take 1-2 tablets by mouth every 6 (six) hours as needed for severe pain. With food Patient not taking: Reported on 04/16/2015 08/23/14   Harriet Masson, DPM  traMADol (ULTRAM) 50 MG tablet Take 1-2 tablets (50-100 mg total) by mouth every 6 (six) hours as needed. Patient not taking: Reported on 04/16/2015 05/31/14   Jayce G Cook, DO   BP 155/89 mmHg  Pulse 86  Temp(Src) 97.5 F (36.4 C) (Oral)  Resp 16  Ht 5\' 5"  (1.651 m)  Wt 168 lb (76.204 kg)  BMI 27.96 kg/m2  SpO2 98% Physical Exam  Constitutional: She is oriented to person, place, and time. She appears well-developed and well-nourished.  HENT:  Head: Normocephalic and atraumatic.  Right Ear: External ear normal.  Left Ear: External ear normal.  Nose: Nose normal.  Eyes: Right eye exhibits no discharge. Left eye exhibits no discharge.  Cardiovascular: Normal rate, regular rhythm and normal heart sounds.   Pulmonary/Chest: Effort normal and breath sounds normal. She exhibits tenderness.    Abdominal: Soft. She exhibits no distension. There is no tenderness.  Musculoskeletal:  No calf or thigh tenderness/swelling bilaterally  Neurological: She  is alert and oriented to person, place, and time.  Skin: Skin is warm and dry.  Nursing note and vitals reviewed.   ED Course  Procedures (including critical care time) Labs Review Labs Reviewed  COMPREHENSIVE METABOLIC PANEL - Abnormal; Notable for the following:    Glucose, Bld 101 (*)    All other components within normal limits  CBC WITH DIFFERENTIAL/PLATELET - Abnormal; Notable for the following:    Lymphocytes Relative 48 (*)    All other components  within normal limits  CBC  BASIC METABOLIC PANEL  BRAIN NATRIURETIC PEPTIDE  D-DIMER, QUANTITATIVE (NOT AT [facility])  TSH  MAGNESIUM  TROPONIN I  CBC  TROPONIN I  TROPONIN I  BASIC METABOLIC PANEL  LIPID PANEL  CBC  I-STAT TROPOININ, ED    Imaging Review Dg Chest 2 View  04/16/2015   CLINICAL DATA:  Chest pain and shortness of breath beginning last night, has worsened today with increased shortness of breath and tight heavy mid sternal chest pain. Pain with breathing. Surgery on LEFT foot 6 months ago.  EXAM: CHEST  2 VIEW  COMPARISON:  04/24/2009  FINDINGS: Normal heart size, mediastinal contours, and pulmonary vascularity.  Lungs clear.  No pleural effusion or pneumothorax.  Bones unremarkable.  IMPRESSION: No acute abnormalities.   Electronically Signed   By: Lavonia Dana M.D.   On: 04/16/2015 17:25     EKG Interpretation   Date/Time:  Monday April 16 2015 16:19:32 EDT Ventricular Rate:  102 PR Interval:  178 QRS Duration: 82 QT Interval:  358 QTC Calculation: 466 R Axis:   50 Text Interpretation:  Sinus tachycardia Biatrial enlargement Abnormal ECG  no acute ST/T changes Confirmed by Tamatha Gadbois  MD, Raizy Auzenne ([id number]) on 04/16/2015  5:24:30 PM      MDM   Final diagnoses:  Chest pain    Patient has no signs of DVT on exam, is otherwise low risk but can't r/o with PERC. Ddimer negative. CP improved with nitro but still present. Normal troponin, but HEART score is a 4, indicated moderate risk. Dr. Terrence Dupont consulted, will admit for ACS r/o and stress test.    Sherwood Gambler, MD 04/16/15 2324

## 2015-04-17 ENCOUNTER — Observation Stay (HOSPITAL_COMMUNITY): Payer: 59

## 2015-04-17 ENCOUNTER — Other Ambulatory Visit (HOSPITAL_COMMUNITY): Payer: Private Health Insurance - Indemnity

## 2015-04-17 ENCOUNTER — Encounter (HOSPITAL_COMMUNITY): Payer: Self-pay | Admitting: *Deleted

## 2015-04-17 LAB — CBC
HCT: 37.1 % (ref 36.0–46.0)
Hemoglobin: 12.4 g/dL (ref 12.0–15.0)
MCH: 29.2 pg (ref 26.0–34.0)
MCHC: 33.4 g/dL (ref 30.0–36.0)
MCV: 87.5 fL (ref 78.0–100.0)
Platelets: 274 10*3/uL (ref 150–400)
RBC: 4.24 MIL/uL (ref 3.87–5.11)
RDW: 13.1 % (ref 11.5–15.5)
WBC: 4.9 10*3/uL (ref 4.0–10.5)

## 2015-04-17 LAB — LIPID PANEL
CHOLESTEROL: 215 mg/dL — AB (ref 0–200)
HDL: 50 mg/dL (ref >40)
LDL Cholesterol: 124 mg/dL — ABNORMAL HIGH (ref 0–99)
TRIGLYCERIDES: 203 mg/dL — AB (ref <150)
Total CHOL/HDL Ratio: 4.3 RATIO
VLDL: 41 mg/dL — ABNORMAL HIGH (ref 0–40)

## 2015-04-17 LAB — BASIC METABOLIC PANEL
ANION GAP: 8 (ref 5–15)
BUN: 8 mg/dL (ref 6–20)
CHLORIDE: 102 mmol/L (ref 101–111)
CO2: 27 mmol/L (ref 22–32)
Calcium: 8.9 mg/dL (ref 8.9–10.3)
Creatinine, Ser: 0.95 mg/dL (ref 0.44–1.00)
GFR calc non Af Amer: >60 mL/min (ref >60)
Glucose, Bld: 88 mg/dL (ref 65–99)
POTASSIUM: 4 mmol/L (ref 3.5–5.1)
Sodium: 137 mmol/L (ref 135–145)

## 2015-04-17 LAB — TROPONIN I
Troponin I: <0.03 ng/mL (ref <0.031)
Troponin I: <0.03 ng/mL (ref <0.031)

## 2015-04-17 MED ORDER — REGADENOSON 0.4 MG/5ML IV SOLN
0.4000 mg | Freq: Once | INTRAVENOUS | Status: AC
  Administered 2015-04-17: 0.4 mg via INTRAVENOUS

## 2015-04-17 MED ORDER — ZOLPIDEM TARTRATE 5 MG PO TABS
5.0000 mg | ORAL_TABLET | Freq: Every evening | ORAL | Status: DC | PRN
  Administered 2015-04-17: 5 mg via ORAL
  Filled 2015-04-17: qty 1

## 2015-04-17 MED ORDER — ASPIRIN 81 MG PO CHEW
81.0000 mg | CHEWABLE_TABLET | ORAL | Status: AC
  Administered 2015-04-18: 81 mg via ORAL
  Filled 2015-04-17: qty 1

## 2015-04-17 MED ORDER — SODIUM CHLORIDE 0.9 % IJ SOLN: 3.0000 mL | INTRAMUSCULAR | Status: DC | PRN

## 2015-04-17 MED ORDER — SODIUM CHLORIDE 0.9 % IJ SOLN
3.0000 mL | Freq: Two times a day (BID) | INTRAMUSCULAR | Status: DC
  Administered 2015-04-17: 3 mL via INTRAVENOUS

## 2015-04-17 MED ORDER — SODIUM CHLORIDE 0.9 % IV SOLN: 250.0000 mL | INTRAVENOUS | Status: DC | PRN

## 2015-04-17 MED ORDER — TECHNETIUM TC 99M SESTAMIBI GENERIC - CARDIOLITE
10.0000 | Freq: Once | INTRAVENOUS | Status: AC | PRN
  Administered 2015-04-17: 10 via INTRAVENOUS

## 2015-04-17 MED ORDER — SODIUM CHLORIDE 0.9 % WEIGHT BASED INFUSION
1.0000 mL/kg/h | INTRAVENOUS | Status: DC
  Administered 2015-04-17: 1 mL/kg/h via INTRAVENOUS

## 2015-04-17 MED ORDER — TECHNETIUM TC 99M SESTAMIBI GENERIC - CARDIOLITE
30.0000 | Freq: Once | INTRAVENOUS | Status: AC | PRN
  Administered 2015-04-17: 30 via INTRAVENOUS

## 2015-04-17 NOTE — Progress Notes (Signed)
Subjective:  Continues to have a recurrent chest pain off and on without associated symptoms. Patient had nuclear stress test today which showed mild to moderate anteroapical wall ischemia with EF of 63%  Objective:  Vital Signs in the last 24 hours: Temp:  [97.5 F (36.4 C)-97.9 F (36.6 C)] 97.9 F (36.6 C) (06/21 1540) Pulse Rate:  [66-126] 74 (06/21 1540) Resp:  [10-22] 18 (06/21 1540) BP: (123-190)/(64-111) 143/83 mmHg (06/21 1540) SpO2:  [98 %-100 %] 98 % (06/21 1540) Weight:  [76.204 kg (168 lb)] 76.204 kg (168 lb) (06/20 1622)  Intake/Output from previous day:   Intake/Output from this shift:    Physical Exam: Neck: no adenopathy, no carotid bruit, no JVD and supple, symmetrical, trachea midline Lungs: clear to auscultation bilaterally Heart: regular rate and rhythm, S1, S2 normal and Soft systolic murmur noted Abdomen: soft, non-tender; bowel sounds normal; no masses,  no organomegaly Extremities: extremities normal, atraumatic, no cyanosis or edema  Lab Results:  Recent Labs  04/16/15 2103 04/17/15 0930  WBC 6.1 4.9  HGB 13.1 12.4  PLT 295 274    Recent Labs  04/16/15 2103 04/17/15 0930  NA 138 137  K 3.9 4.0  CL 101 102  CO2 28 27  GLUCOSE 101* 88  BUN 9 8  CREATININE 0.91 0.95    Recent Labs  04/17/15 0255 04/17/15 0930  TROPONINI <0.03 <0.03   Hepatic Function Panel  Recent Labs  04/16/15 2103  PROT 7.5  ALBUMIN 4.0  AST 23  ALT 22  ALKPHOS 63  BILITOT 0.6    Recent Labs  04/17/15 0255  CHOL 215*   No results for input(s): PROTIME in the last 72 hours.  Imaging: Imaging results have been reviewed and Dg Chest 2 View  04/16/2015   CLINICAL DATA:  Chest pain and shortness of breath beginning last night, has worsened today with increased shortness of breath and tight heavy mid sternal chest pain. Pain with breathing. Surgery on LEFT foot 6 months ago.  EXAM: CHEST  2 VIEW  COMPARISON:  04/24/2009  FINDINGS: Normal heart size,  mediastinal contours, and pulmonary vascularity.  Lungs clear.  No pleural effusion or pneumothorax.  Bones unremarkable.  IMPRESSION: No acute abnormalities.   Electronically Signed   By: Lavonia Dana M.D.   On: 04/16/2015 17:25   Nm Myocar Multi W/spect W/wall Motion / Ef  04/17/2015   CLINICAL DATA:  Acute chest pain with hypertension.  EXAM: MYOCARDIAL IMAGING WITH SPECT (REST AND PHARMACOLOGIC-STRESS)  GATED LEFT VENTRICULAR WALL MOTION STUDY  LEFT VENTRICULAR EJECTION FRACTION  TECHNIQUE: Standard myocardial SPECT imaging was performed after resting intravenous injection of 10 mCi Tc-67m sestamibi. Subsequently, intravenous infusion of Lexiscan was performed under the supervision of the Cardiology staff. At peak effect of the drug, 30 mCi Tc-16m sestamibi was injected intravenously and standard myocardial SPECT imaging was performed. Quantitative gated imaging was also performed to evaluate left ventricular wall motion, and estimate left ventricular ejection fraction.  COMPARISON:  None.  FINDINGS: Perfusion: Small to moderate anterior apical perfusion defect on both the rest and stress views. This appears slightly larger on the stress imaging. A component of this is likely secondary to breast attenuation however subtle anterior apical ischemia cannot be excluded.  Wall Motion: Normal left ventricular wall motion. No left ventricular dilation.  Left Ventricular Ejection Fraction: 63 %  End diastolic volume 60 ml  End systolic volume 22 ml  IMPRESSION: 1. Small to moderate anterior apical perfusion defect, larger on  the stress imaging. Difficult to exclude anterior apical ischemia. A component of this is also likely secondary to breast attenuation.  2. Normal left ventricular wall motion.  3. Left ventricular ejection fraction 63%  4. Intermediate-risk stress test findings*.  *2012 Appropriate Use Criteria for Coronary Revascularization Focused Update: J Am Coll Cardiol. 8299;37(1):696-789.  http://content.airportbarriers.com.aspx?articleid=[email or web address]   Electronically Signed   By: Jerilynn Mages.  Shick M.D.   On: 04/17/2015 15:41    Cardiac Studies:  Assessment/Plan:  Atypical chest pain with some features worrisome for angina rule out on her insufficiency positive nuclear stress test Hypertension Hypercholesteremia Surgical menopause Strong family history of coronary artery disease  Status post recent bilateral foot bunion surgery   plan Discussed with patient regarding nuclear stress test and various options of treatment i.e. medical versus invasive left cath possible PTCA stenting its risk and benefits i.e. death MI stroke need for emergency CABG local vascular complications etc. and consented for PCI   [name] 04/17/2015, 3:55 PM

## 2015-04-18 ENCOUNTER — Encounter (HOSPITAL_COMMUNITY)
Admission: EM | Disposition: A | Discharge status: Home / Self Care | Payer: Private Health Insurance - Indemnity | Source: Home / Self Care | Attending: Cardiology

## 2015-04-18 DIAGNOSIS — E8941 Symptomatic postprocedural ovarian failure: Secondary | ICD-10-CM | POA: Diagnosis present

## 2015-04-18 DIAGNOSIS — Z79899 Other long term (current) drug therapy: Secondary | ICD-10-CM | POA: Diagnosis not present

## 2015-04-18 DIAGNOSIS — I10 Essential (primary) hypertension: Secondary | ICD-10-CM | POA: Diagnosis present

## 2015-04-18 DIAGNOSIS — R0789 Other chest pain: Secondary | ICD-10-CM | POA: Diagnosis present

## 2015-04-18 DIAGNOSIS — Z880 Allergy status to penicillin: Secondary | ICD-10-CM | POA: Diagnosis not present

## 2015-04-18 DIAGNOSIS — Z8249 Family history of ischemic heart disease and other diseases of the circulatory system: Secondary | ICD-10-CM | POA: Diagnosis not present

## 2015-04-18 DIAGNOSIS — R079 Chest pain, unspecified: Secondary | ICD-10-CM | POA: Diagnosis present

## 2015-04-18 DIAGNOSIS — E780 Pure hypercholesterolemia: Secondary | ICD-10-CM | POA: Diagnosis present

## 2015-04-18 HISTORY — PX: CARDIAC CATHETERIZATION: SHX172

## 2015-04-18 LAB — PROTIME-INR
INR: 0.97 (ref 0.00–1.49)
PROTHROMBIN TIME: 13.1 s (ref 11.6–15.2)

## 2015-04-18 LAB — APTT: APTT: 49 s — AB (ref 24–37)

## 2015-04-18 SURGERY — LEFT HEART CATH AND CORONARY ANGIOGRAPHY

## 2015-04-18 MED ORDER — SODIUM CHLORIDE 0.9 % IV SOLN: 250.0000 mL | INTRAVENOUS | Status: DC | PRN

## 2015-04-18 MED ORDER — MIDAZOLAM HCL 2 MG/2ML IJ SOLN
INTRAMUSCULAR | Status: DC | PRN
Start: 2015-04-18 — End: 2015-04-18
  Administered 2015-04-18 (×2): 1 mg via INTRAVENOUS

## 2015-04-18 MED ORDER — FENTANYL CITRATE (PF) 100 MCG/2ML IJ SOLN
INTRAMUSCULAR | Status: AC
  Filled 2015-04-18: qty 2

## 2015-04-18 MED ORDER — SODIUM CHLORIDE 0.9 % WEIGHT BASED INFUSION: 3.0000 mL/kg/h | INTRAVENOUS | Status: AC

## 2015-04-18 MED ORDER — NITROGLYCERIN 1 MG/10 ML FOR IR/CATH LAB
INTRA_ARTERIAL | Status: AC
  Filled 2015-04-18: qty 10

## 2015-04-18 MED ORDER — OXYCODONE-ACETAMINOPHEN 5-325 MG PO TABS
1.0000 | ORAL_TABLET | Freq: Four times a day (QID) | ORAL | Status: DC | PRN
  Administered 2015-04-18 – 2015-04-19 (×4): 1 via ORAL
  Filled 2015-04-18 (×4): qty 1

## 2015-04-18 MED ORDER — IOHEXOL 350 MG/ML SOLN
INTRAVENOUS | Status: DC | PRN
  Administered 2015-04-18: 60 mL via INTRAVENOUS

## 2015-04-18 MED ORDER — ACETAMINOPHEN 325 MG PO TABS: 650.0000 mg | ORAL_TABLET | ORAL | Status: DC | PRN

## 2015-04-18 MED ORDER — SODIUM CHLORIDE 0.9 % IJ SOLN
3.0000 mL | INTRAMUSCULAR | Status: DC | PRN
  Administered 2015-04-19: 3 mL via INTRAVENOUS
  Filled 2015-04-18: qty 3

## 2015-04-18 MED ORDER — SODIUM CHLORIDE 0.9 % IV SOLN
INTRAVENOUS | Status: DC | PRN
  Administered 2015-04-18: 250 mL via INTRAVENOUS

## 2015-04-18 MED ORDER — SODIUM CHLORIDE 0.9 % IJ SOLN
3.0000 mL | Freq: Two times a day (BID) | INTRAMUSCULAR | Status: DC
  Administered 2015-04-18: 3 mL via INTRAVENOUS

## 2015-04-18 MED ORDER — MIDAZOLAM HCL 2 MG/2ML IJ SOLN
INTRAMUSCULAR | Status: AC
  Filled 2015-04-18: qty 2

## 2015-04-18 MED ORDER — FENTANYL CITRATE (PF) 100 MCG/2ML IJ SOLN
INTRAMUSCULAR | Status: DC | PRN
  Administered 2015-04-18 (×2): 25 ug via INTRAVENOUS

## 2015-04-18 MED ORDER — LIDOCAINE HCL (PF) 1 % IJ SOLN
INTRAMUSCULAR | Status: AC
  Filled 2015-04-18: qty 30

## 2015-04-18 MED ORDER — HEPARIN (PORCINE) IN NACL 2-0.9 UNIT/ML-% IJ SOLN
INTRAMUSCULAR | Status: AC
  Filled 2015-04-18: qty 1500

## 2015-04-18 MED ORDER — ONDANSETRON HCL 4 MG/2ML IJ SOLN: 4.0000 mg | Freq: Four times a day (QID) | INTRAMUSCULAR | Status: DC | PRN

## 2015-04-18 SURGICAL SUPPLY — 7 items
CATH INFINITI 5FR MULTPACK ANG (CATHETERS) ×2
KIT HEART LEFT (KITS) ×2
PACK CARDIAC CATHETERIZATION (CUSTOM PROCEDURE TRAY) ×2
SHEATH PINNACLE 5F 10CM (SHEATH) ×2
SYR MEDRAD MARK V 150ML (SYRINGE) ×2
TRANSDUCER W/STOPCOCK (MISCELLANEOUS) ×2
WIRE EMERALD 3MM-J .035X150CM (WIRE) ×2

## 2015-04-18 NOTE — H&P (View-Only) (Signed)
Subjective:  Continues to have a recurrent chest pain off and on without associated symptoms. Patient had nuclear stress test today which showed mild to moderate anteroapical wall ischemia with EF of 63%  Objective:  Vital Signs in the last 24 hours: Temp:  [97.5 F (36.4 C)-97.9 F (36.6 C)] 97.9 F (36.6 C) (06/21 1540) Pulse Rate:  [66-126] 74 (06/21 1540) Resp:  [10-22] 18 (06/21 1540) BP: (123-190)/(64-111) 143/83 mmHg (06/21 1540) SpO2:  [98 %-100 %] 98 % (06/21 1540) Weight:  [76.204 kg (168 lb)] 76.204 kg (168 lb) (06/20 1622)  Intake/Output from previous day:   Intake/Output from this shift:    Physical Exam: Neck: no adenopathy, no carotid bruit, no JVD and supple, symmetrical, trachea midline Lungs: clear to auscultation bilaterally Heart: regular rate and rhythm, S1, S2 normal and Soft systolic murmur noted Abdomen: soft, non-tender; bowel sounds normal; no masses,  no organomegaly Extremities: extremities normal, atraumatic, no cyanosis or edema  Lab Results:  Recent Labs  04/16/15 2103 04/17/15 0930  WBC 6.1 4.9  HGB 13.1 12.4  PLT 295 274    Recent Labs  04/16/15 2103 04/17/15 0930  NA 138 137  K 3.9 4.0  CL 101 102  CO2 28 27  GLUCOSE 101* 88  BUN 9 8  CREATININE 0.91 0.95    Recent Labs  04/17/15 0255 04/17/15 0930  TROPONINI <0.03 <0.03   Hepatic Function Panel  Recent Labs  04/16/15 2103  PROT 7.5  ALBUMIN 4.0  AST 23  ALT 22  ALKPHOS 63  BILITOT 0.6    Recent Labs  04/17/15 0255  CHOL 215*   No results for input(s): PROTIME in the last 72 hours.  Imaging: Imaging results have been reviewed and Dg Chest 2 View  04/16/2015   CLINICAL DATA:  Chest pain and shortness of breath beginning last night, has worsened today with increased shortness of breath and tight heavy mid sternal chest pain. Pain with breathing. Surgery on LEFT foot 6 months ago.  EXAM: CHEST  2 VIEW  COMPARISON:  04/24/2009  FINDINGS: Normal heart size,  mediastinal contours, and pulmonary vascularity.  Lungs clear.  No pleural effusion or pneumothorax.  Bones unremarkable.  IMPRESSION: No acute abnormalities.   Electronically Signed   By: Lavonia Dana M.D.   On: 04/16/2015 17:25   Nm Myocar Multi W/spect W/wall Motion / Ef  04/17/2015   CLINICAL DATA:  Acute chest pain with hypertension.  EXAM: MYOCARDIAL IMAGING WITH SPECT (REST AND PHARMACOLOGIC-STRESS)  GATED LEFT VENTRICULAR WALL MOTION STUDY  LEFT VENTRICULAR EJECTION FRACTION  TECHNIQUE: Standard myocardial SPECT imaging was performed after resting intravenous injection of 10 mCi Tc-46m sestamibi. Subsequently, intravenous infusion of Lexiscan was performed under the supervision of the Cardiology staff. At peak effect of the drug, 30 mCi Tc-92m sestamibi was injected intravenously and standard myocardial SPECT imaging was performed. Quantitative gated imaging was also performed to evaluate left ventricular wall motion, and estimate left ventricular ejection fraction.  COMPARISON:  None.  FINDINGS: Perfusion: Small to moderate anterior apical perfusion defect on both the rest and stress views. This appears slightly larger on the stress imaging. A component of this is likely secondary to breast attenuation however subtle anterior apical ischemia cannot be excluded.  Wall Motion: Normal left ventricular wall motion. No left ventricular dilation.  Left Ventricular Ejection Fraction: 63 %  End diastolic volume 60 ml  End systolic volume 22 ml  IMPRESSION: 1. Small to moderate anterior apical perfusion defect, larger on  the stress imaging. Difficult to exclude anterior apical ischemia. A component of this is also likely secondary to breast attenuation.  2. Normal left ventricular wall motion.  3. Left ventricular ejection fraction 63%  4. Intermediate-risk stress test findings*.  *2012 Appropriate Use Criteria for Coronary Revascularization Focused Update: J Am Coll Cardiol. 7614;70(9):295-747.  http://content.airportbarriers.com.aspx?articleid=1201161   Electronically Signed   By: Jerilynn Mages.  Shick M.D.   On: 04/17/2015 15:41    Cardiac Studies:  Assessment/Plan:  Atypical chest pain with some features worrisome for angina rule out on her insufficiency positive nuclear stress test Hypertension Hypercholesteremia Surgical menopause Strong family history of coronary artery disease  Status post recent bilateral foot bunion surgery   plan Discussed with patient regarding nuclear stress test and various options of treatment i.e. medical versus invasive left cath possible PTCA stenting its risk and benefits i.e. death MI stroke need for emergency CABG local vascular complications etc. and consented for PCI   [name] 04/17/2015, 3:55 PM

## 2015-04-18 NOTE — Interval H&P Note (Signed)
Cath Lab Visit (complete for each Cath Lab visit)  Clinical Evaluation Leading to the Procedure:   ACS: No.  Non-ACS:    Anginal Classification: CCS III  Anti-ischemic medical therapy: Maximal Therapy (2 or more classes of medications)  Non-Invasive Test Results: Intermediate-risk stress test findings: cardiac mortality 1-3%/year  Prior CABG: No previous CABG      History and Physical Interval Note:  04/18/2015 3:19 PM  [name]  has presented today for surgery, with the diagnosis of cp  The various methods of treatment have been discussed with the patient and family. After consideration of risks, benefits and other options for treatment, the patient has consented to  Procedure(s): Left Heart Cath and Coronary Angiography (N/A) as a surgical intervention .  The patient's history has been reviewed, patient examined, no change in status, stable for surgery.  I have reviewed the patient's chart and labs.  Questions were answered to the patient's satisfaction.     [name]

## 2015-04-18 NOTE — Progress Notes (Addendum)
Site area: rt groin fa sheath removed by Moishe Spice Site Prior to Removal:  Level  0 Pressure Applied For: 20 minutes Manual:   yes Patient Status During Pull:  stable Post Pull Site:  Level  0 Post Pull Instructions Given:  yes Post Pull Pulses Present: yes Dressing Applied:  yes Bedrest begins @  7972 Comments:  none

## 2015-04-19 ENCOUNTER — Encounter (HOSPITAL_COMMUNITY): Payer: Self-pay | Admitting: Cardiology

## 2015-04-19 MED ORDER — ASPIRIN 81 MG PO TBEC: 81.0000 mg | DELAYED_RELEASE_TABLET | Freq: Every day | ORAL | Status: DC

## 2015-04-19 MED ORDER — SENNOSIDES-DOCUSATE SODIUM 8.6-50 MG PO TABS
2.0000 | ORAL_TABLET | Freq: Once | ORAL | Status: AC
  Administered 2015-04-19: 2 via ORAL
  Filled 2015-04-19: qty 2

## 2015-04-19 MED ORDER — NITROGLYCERIN 0.4 MG SL SUBL: 0.4000 mg | SUBLINGUAL_TABLET | SUBLINGUAL | Status: DC | PRN

## 2015-04-19 MED ORDER — METOPROLOL SUCCINATE ER 50 MG PO TB24
50.0000 mg | ORAL_TABLET | Freq: Every day | ORAL | Status: DC
Start: 2015-04-19 — End: 2017-11-06

## 2015-04-19 MED FILL — Lidocaine HCl Local Preservative Free (PF) Inj 1%: INTRAMUSCULAR | Qty: 30 | Status: AC

## 2015-04-19 MED FILL — Nitroglycerin IV Soln 100 MCG/ML in D5W: INTRA_ARTERIAL | Qty: 10 | Status: AC

## 2015-04-19 MED FILL — Heparin Sodium (Porcine) 2 Unit/ML in Sodium Chloride 0.9%: INTRAMUSCULAR | Qty: 1500 | Status: AC

## 2015-04-19 NOTE — Progress Notes (Signed)
Pt discharged home with husband. All discharge paperwork reviewed with pt. Pt understands all paperwork. IV removed. 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

## 2015-04-19 NOTE — Discharge Summary (Signed)
[name], [name]           ACCOUNT NO.:  [id number]  MEDICAL RECORD NO.:  [id number]  LOCATION:  3W18C                        FACILITY:  [facility]  PHYSICIAN:  Allegra Lai. Terrence Dupont, M.D. DATE OF BIRTH:  1961-10-01  DATE OF ADMISSION:  04/16/2015 DATE OF DISCHARGE:  04/19/2015                              DISCHARGE SUMMARY   ADMITTING DIAGNOSES: 1. Atypical chest pain with some features worrisome for angina, rule     out coronary insufficiency, rule out myocardial infarction. 2. Hypertension. 3. Hypercholesteremia. 4. Surgical menopause. 5. Strong family history of coronary artery disease. 6. Status post recent bilateral foot bunion surgery.  DISCHARGE DIAGNOSES: 1. Status post atypical chest pain, positive nuclear stress test     status post left cardiac cath. 2. Hypertension. 3. Hypercholesteremia. 4. Surgical menopause. 5. Strong family history of coronary artery disease. 6. Status post recent bilateral foot bunion surgery.  DISCHARGE HOME MEDICATIONS: 1. Aspirin 81 mg 1 tablet daily. 2. Metoprolol succinate 50 mg daily. 3. Nitrostat 0.4 mg sublingual p.r.n. 4. Atorvastatin 20 mg daily. 5. Estradiol acetate 0.5 mg 3 times daily as before. 6. Percocet 1 to 2 tablets every 6 hours as before. 7. Vitamin D 1 tablet daily as before. 8. The patient has been advised to stop tramadol and ibuprofen.  DIET:  Low salt, low cholesterol.  ACTIVITY:  Increase activity slowly as tolerated.  DISCHARGE INSTRUCTIONS:  Post cardiac cath instructions have been given.  FOLLOWUP:  Follow up with me in 1 week.  Follow up with PMD as scheduled.  CONDITION AT DISCHARGE:  Stable.  BRIEF HISTORY AND HOSPITAL COURSE:  [name] is a 54 year old female with past medical history significant for hypertension, hypercholesteremia, strong family history of coronary artery disease, brother had MI in his 68s, mother died of myocardial infarction in her 63s, she has surgical  menopause.  She came to the ER complaining of recurrent retrosternal chest pain described as tightness, grade 8/10, off and on since Saturday.  The patient received sublingual nitro in the ER with partial relief.  Stated also chest pain increases with deep breathing occasionally, chest pain travels to the left arm associated with nausea.  The patient denies any exertional chest pain.  Denies any cardiac workup recently.  Denies any cough, fever.  EKG done in the ED showed normal sinus rhythm with nonspecific T-wave changes.  No acute ischemic changes were noted.  First set of cardiac enzyme was negative. The patient also had a D-dimer and BNP, which were normal.  The patient denies any relation of chest pain to food.  Denies any palpitation, lightheadedness, or syncope.  PHYSICAL EXAMINATION:  GENERAL:  She was alert, awake, oriented x3. VITAL SIGNS:  Blood pressure was 182/85, pulse 76, she was afebrile. EYES:  Conjunctivae were pink. NECK:  Supple.  No JVD.  No bruit. LUNGS:  Clear to auscultation without rhonchi or rales. CARDIOVASCULAR:  S1, S2 was normal.  There was soft systolic murmur.  No S3 gallop noted. ABDOMEN:  Soft.  Bowel sounds were present.  Nontender. EXTREMITIES:  There is no clubbing, cyanosis, or edema.  LABORATORY DATA:  Three sets of troponin-I were negative.  Cholesterol was 215, triglycerides 203, HDL 50,  LDL 124.  Sodium was 138, potassium 3.9, BUN 9, creatinine 0.91, glucose was 101, repeat fasting blood sugar was 88.  Hemoglobin was 13.1, hematocrit 38.3, white count of 6.1.  Her TSH was 3.66.  Nuclear stress test showed small-to-moderate anterior apical perfusion defect, large on the stress imaging as compared to rest imaging, difficult to exclude anterior apical ischemia, a component of breast attenuation also present.  BRIEF HOSPITAL COURSE:  The patient was admitted to telemetry unit.  MI was ruled out by serial enzymes and EKG.  The patient  subsequently underwent nuclear stress test as above, which showed small-to-moderate reversible ischemia in the anteroapical wall with normal LV systolic function.  The patient subsequently underwent cardiac catheterization. As per procedure report, the patient did not have any significant angiographic coronary artery disease.  The patient did not have any further episodes of chest pain during the hospital stay.  Her groin is stable with no evidence of hematoma or bruit.  The patient is ambulating in the room without any problems.  The patient will be discharged home on above medications and will be followed up in my office in 1 week.     Allegra Lai. Terrence Dupont, M.D.     MNH/MEDQ  D:  04/19/2015  T:  04/19/2015  Job:  [id number]  cc:   Allegra Lai. Terrence Dupont, M.D.

## 2015-04-19 NOTE — Clinical Documentation Improvement (Deleted)
Patient admitted with chest pain.  LHC performed 04/18/15 with normal coronaries and hyperdynamic LV function. EF 63% per nuclear stress test.  Patient receiving Protonix 40 mg daily per current MAR.  Please document the suspected, likely or known cause of the patient's chest pain in the progress notes and discharge summary.  Thank You, Erling Conte ,RN Clinical Documentation Specialist:  [phone] [place] Information Management

## 2015-04-19 NOTE — Discharge Instructions (Addendum)
°Chest Pain (Nonspecific) °It is often hard to give a specific diagnosis for the cause of chest pain. There is always a chance that your pain could be related to something serious, such as a heart attack or a blood clot in the lungs. You need to follow up with your health care provider for further evaluation. °CAUSES  °· Heartburn. °· Pneumonia or bronchitis. °· Anxiety or stress. °· Inflammation around your heart (pericarditis) or lung (pleuritis or pleurisy). °· A blood clot in the lung. °· A collapsed lung (pneumothorax). It can develop suddenly on its own (spontaneous pneumothorax) or from trauma to the chest. °· Shingles infection (herpes zoster virus). °The chest wall is composed of bones, muscles, and cartilage. Any of these can be the source of the pain. °· The bones can be bruised by injury. °· The muscles or cartilage can be strained by coughing or overwork. °· The cartilage can be affected by inflammation and become sore (costochondritis). °DIAGNOSIS  °Lab tests or other studies may be needed to find the cause of your pain. Your health care provider may have you take a test called an ambulatory electrocardiogram (ECG). An ECG records your heartbeat patterns over a 24-hour period. You may also have other tests, such as: °· Transthoracic echocardiogram (TTE). During echocardiography, sound waves are used to evaluate how blood flows through your heart. °· Transesophageal echocardiogram (TEE). °· Cardiac monitoring. This allows your health care provider to monitor your heart rate and rhythm in real time. °· Holter monitor. This is a portable device that records your heartbeat and can help diagnose heart arrhythmias. It allows your health care provider to track your heart activity for several days, if needed. °· Stress tests by exercise or by giving medicine that makes the heart beat faster. °TREATMENT  °· Treatment depends on what may be causing your chest pain. Treatment may include: °¨ Acid blockers for  heartburn. °¨ Anti-inflammatory medicine. °¨ Pain medicine for inflammatory conditions. °¨ Antibiotics if an infection is present. °· You may be advised to change lifestyle habits. This includes stopping smoking and avoiding alcohol, caffeine, and chocolate. °· You may be advised to keep your head raised (elevated) when sleeping. This reduces the chance of acid going backward from your stomach into your esophagus. °Most of the time, nonspecific chest pain will improve within 2-3 days with rest and mild pain medicine.  °HOME CARE INSTRUCTIONS  °· If antibiotics were prescribed, take them as directed. Finish them even if you start to feel better. °· For the next few days, avoid physical activities that bring on chest pain. Continue physical activities as directed. °· Do not use any tobacco products, including cigarettes, chewing tobacco, or electronic cigarettes. °· Avoid drinking alcohol. °· Only take medicine as directed by your health care provider. °· Follow your health care provider's suggestions for further testing if your chest pain does not go away. °· Keep any follow-up appointments you made. If you do not go to an appointment, you could develop lasting (chronic) problems with pain. If there is any problem keeping an appointment, call to reschedule. °SEEK MEDICAL CARE IF:  °· Your chest pain does not go away, even after treatment. °· You have a rash with blisters on your chest. °· You have a fever. °SEEK IMMEDIATE MEDICAL CARE IF:  °· You have increased chest pain or pain that spreads to your arm, neck, jaw, back, or abdomen. °· You have shortness of breath. °· You have an increasing cough, or you cough   up blood.  You have severe back or abdominal pain.  You feel nauseous or vomit.  You have severe weakness.  You faint.  You have chills. This is an emergency. Do not wait to see if the pain will go away. Get medical help at once. Call your local emergency services (911 in [place] Do not drive  yourself to the hospital. MAKE SURE YOU:   Understand these instructions.  Will watch your condition.  Will get help right away if you are not doing well or get worse. Document Released: 07/23/2005 Document Revised: 10/18/2013 Document Reviewed: 05/18/2008 [facility] Patient Information 2015 [place], [place]. This information is not intended to replace advice given to you by your health care provider. Make sure you discuss any questions you have with your health care provider. Coronary Angiogram A coronary angiogram, also called coronary angiography, is an X-ray procedure used to look at the arteries in the heart. In this procedure, a dye (contrast dye) is injected through a long, hollow tube (catheter). The catheter is about the size of a piece of cooked spaghetti and is inserted through your groin, wrist, or arm. The dye is injected into each artery, and X-rays are then taken to show if there is a blockage in the arteries of your heart. LET [facility] CARE PROVIDER KNOW ABOUT:  Any allergies you have, including allergies to shellfish or contrast dye.   All medicines you are taking, including vitamins, herbs, eye drops, creams, and over-the-counter medicines.   Previous problems you or members of your family have had with the use of anesthetics.   Any blood disorders you have.   Previous surgeries you have had.  History of kidney problems or failure.   Other medical conditions you have. RISKS AND COMPLICATIONS  Generally, a coronary angiogram is a safe procedure. However, problems can occur and include:  Allergic reaction to the dye.  Bleeding from the access site or other locations.  Kidney injury, especially in people with impaired kidney function.  Stroke (rare).  Heart attack (rare). BEFORE THE PROCEDURE   Do not eat or drink anything after midnight the night before the procedure or as directed by your health care provider.   Ask your health care provider about  changing or stopping your regular medicines. This is especially important if you are taking diabetes medicines or blood thinners. PROCEDURE  You may be given a medicine to help you relax (sedative) before the procedure. This medicine is given through an intravenous (IV) access tube that is inserted into one of your veins.   The area where the catheter will be inserted will be washed and shaved. This is usually done in the groin but may be done in the fold of your arm (near your elbow) or in the wrist.   A medicine will be given to numb the area where the catheter will be inserted (local anesthetic).   The health care provider will insert the catheter into an artery. The catheter will be guided by using a special type of X-ray (fluoroscopy) of the blood vessel being examined.   A special dye will then be injected into the catheter, and X-rays will be taken. The dye will help to show where any narrowing or blockages are located in the heart arteries.  AFTER THE PROCEDURE   If the procedure is done through the leg, you will be kept in bed lying flat for several hours. You will be instructed to not bend or cross your legs.  The insertion  site will be checked frequently.   The pulse in your feet or wrist will be checked frequently.   Additional blood tests, X-rays, and an electrocardiogram may be done.  Document Released: 04/19/2003 Document Revised: 02/27/2014 Document Reviewed: 03/07/2013 [facility] Patient Information 2015 [place], [place]. This information is not intended to replace advice given to you by your health care provider. Make sure you discuss any questions you have with your health care provider. Radial Site Care Refer to this sheet in the next few weeks. These instructions provide you with information on caring for yourself after your procedure. Your caregiver may also give you more specific instructions. Your treatment has been planned according to current medical practices,  but problems sometimes occur. Call your caregiver if you have any problems or questions after your procedure. HOME CARE INSTRUCTIONS  You may shower the day after the procedure.Remove the bandage (dressing) and gently wash the site with plain soap and water.Gently pat the site dry.  Do not apply powder or lotion to the site.  Do not submerge the affected site in water for 3 to 5 days.  Inspect the site at least twice daily.  Do not flex or bend the affected arm for 24 hours.  No lifting over 5 pounds (2.3 kg) for 5 days after your procedure.  Do not drive home if you are discharged the same day of the procedure. Have someone else drive you.  You may drive 24 hours after the procedure unless otherwise instructed by your caregiver.  Do not operate machinery or power tools for 24 hours.  A responsible adult should be with you for the first 24 hours after you arrive home. What to expect:  Any bruising will usually fade within 1 to 2 weeks.  Blood that collects in the tissue (hematoma) may be painful to the touch. It should usually decrease in size and tenderness within 1 to 2 weeks. SEEK IMMEDIATE MEDICAL CARE IF:  You have unusual pain at the radial site.  You have redness, warmth, swelling, or pain at the radial site.  You have drainage (other than a small amount of blood on the dressing).  You have chills.  You have a fever or persistent symptoms for more than 72 hours.  You have a fever and your symptoms suddenly get worse.  Your arm becomes pale, cool, tingly, or numb.  You have heavy bleeding from the site. Hold pressure on the site. Document Released: 11/15/2010 Document Revised: 01/05/2012 Document Reviewed: 11/15/2010 [facility] ([facility] Patient Information 2015 [place], [place]. This information is not intended to replace advice given to you by your health care provider. Make sure you discuss any questions you have with your health care provider.

## 2015-04-19 NOTE — Discharge Summary (Signed)
Discharge summary dictated on 04/19/2015 dictation number is [phone]

## 2015-10-30 ENCOUNTER — Other Ambulatory Visit: Payer: Self-pay

## 2015-10-30 DIAGNOSIS — Z1231 Encounter for screening mammogram for malignant neoplasm of breast: Secondary | ICD-10-CM

## 2015-11-09 ENCOUNTER — Ambulatory Visit
Admission: RE | Admit: 2015-11-09 | Discharge: 2015-11-09 | Disposition: A | Discharge status: Home / Self Care | Payer: 59 | Source: Ambulatory Visit

## 2015-11-09 DIAGNOSIS — N390 Urinary tract infection, site not specified: Secondary | ICD-10-CM | POA: Diagnosis not present

## 2015-11-09 DIAGNOSIS — Z1231 Encounter for screening mammogram for malignant neoplasm of breast: Secondary | ICD-10-CM | POA: Diagnosis not present

## 2015-11-09 DIAGNOSIS — M5416 Radiculopathy, lumbar region: Secondary | ICD-10-CM | POA: Diagnosis not present

## 2015-11-09 DIAGNOSIS — R8290 Unspecified abnormal findings in urine: Secondary | ICD-10-CM | POA: Diagnosis not present

## 2015-11-09 DIAGNOSIS — Z6828 Body mass index (BMI) 28.0-28.9, adult: Secondary | ICD-10-CM | POA: Diagnosis not present

## 2015-11-09 DIAGNOSIS — M47816 Spondylosis without myelopathy or radiculopathy, lumbar region: Secondary | ICD-10-CM | POA: Diagnosis not present

## 2015-11-09 DIAGNOSIS — M5432 Sciatica, left side: Secondary | ICD-10-CM | POA: Diagnosis not present

## 2015-11-09 MED FILL — HYDROCODON-APAP 5-325: 5-325 | 4 days supply | Qty: 30 | Fill #0

## 2015-11-09 MED FILL — METHOCARBAMOL 500 MG TABLET: 500 | 15 days supply | Qty: 45 | Fill #0

## 2015-11-09 MED FILL — predniSONE 10 MG (21) TBPK: 10 | 6 days supply | Qty: 21 | Fill #0

## 2015-11-13 MED FILL — predniSONE 10 MG TABS: 10 | 6 days supply | Qty: 21 | Fill #0

## 2015-11-20 DIAGNOSIS — M5442 Lumbago with sciatica, left side: Secondary | ICD-10-CM | POA: Diagnosis not present

## 2015-11-20 DIAGNOSIS — M5136 Other intervertebral disc degeneration, lumbar region: Secondary | ICD-10-CM | POA: Diagnosis not present

## 2015-11-23 ENCOUNTER — Other Ambulatory Visit (HOSPITAL_COMMUNITY): Payer: Self-pay | Admitting: Physical Medicine and Rehabilitation

## 2015-11-23 DIAGNOSIS — M5136 Other intervertebral disc degeneration, lumbar region: Secondary | ICD-10-CM

## 2015-11-26 MED FILL — CARVEDILOL 12.5 MG TABLET: 12.5 | 30 days supply | Qty: 60 | Fill #0

## 2015-11-27 ENCOUNTER — Ambulatory Visit (HOSPITAL_COMMUNITY)
Admission: RE | Admit: 2015-11-27 | Discharge: 2015-11-27 | Disposition: A | Discharge status: Home / Self Care | Payer: 59 | Source: Ambulatory Visit | Attending: Physical Medicine and Rehabilitation | Admitting: Physical Medicine and Rehabilitation

## 2015-11-27 DIAGNOSIS — M5136 Other intervertebral disc degeneration, lumbar region: Secondary | ICD-10-CM

## 2015-11-27 MED FILL — diazePAM 10 MG TABS: 10 | 1 days supply | Qty: 2 | Fill #0

## 2015-11-30 ENCOUNTER — Ambulatory Visit (HOSPITAL_COMMUNITY)
Admission: RE | Admit: 2015-11-30 | Discharge: 2015-11-30 | Disposition: A | Discharge status: Home / Self Care | Payer: 59 | Source: Ambulatory Visit | Attending: Physical Medicine and Rehabilitation | Admitting: Physical Medicine and Rehabilitation

## 2015-11-30 DIAGNOSIS — M5126 Other intervertebral disc displacement, lumbar region: Secondary | ICD-10-CM | POA: Insufficient documentation

## 2015-11-30 DIAGNOSIS — M4806 Spinal stenosis, lumbar region: Secondary | ICD-10-CM | POA: Insufficient documentation

## 2015-11-30 DIAGNOSIS — M5136 Other intervertebral disc degeneration, lumbar region: Secondary | ICD-10-CM | POA: Diagnosis not present

## 2015-11-30 DIAGNOSIS — M5146 Schmorl's nodes, lumbar region: Secondary | ICD-10-CM | POA: Insufficient documentation

## 2015-12-14 DIAGNOSIS — M5136 Other intervertebral disc degeneration, lumbar region: Secondary | ICD-10-CM | POA: Diagnosis not present

## 2015-12-14 DIAGNOSIS — M5442 Lumbago with sciatica, left side: Secondary | ICD-10-CM | POA: Diagnosis not present

## 2015-12-14 DIAGNOSIS — M4806 Spinal stenosis, lumbar region: Secondary | ICD-10-CM | POA: Diagnosis not present

## 2015-12-17 DIAGNOSIS — Z6827 Body mass index (BMI) 27.0-27.9, adult: Secondary | ICD-10-CM | POA: Diagnosis not present

## 2015-12-17 DIAGNOSIS — M5416 Radiculopathy, lumbar region: Secondary | ICD-10-CM | POA: Diagnosis not present

## 2015-12-17 DIAGNOSIS — M5136 Other intervertebral disc degeneration, lumbar region: Secondary | ICD-10-CM | POA: Diagnosis not present

## 2015-12-17 DIAGNOSIS — Z01818 Encounter for other preprocedural examination: Secondary | ICD-10-CM | POA: Diagnosis not present

## 2015-12-17 DIAGNOSIS — M5432 Sciatica, left side: Secondary | ICD-10-CM | POA: Diagnosis not present

## 2015-12-19 ENCOUNTER — Encounter (HOSPITAL_COMMUNITY)
Admission: RE | Admit: 2015-12-19 | Discharge: 2015-12-19 | Disposition: A | Discharge status: Home / Self Care | Payer: 59 | Source: Ambulatory Visit | Attending: Specialist | Admitting: Specialist

## 2015-12-19 ENCOUNTER — Encounter (HOSPITAL_COMMUNITY): Payer: Self-pay

## 2015-12-19 ENCOUNTER — Ambulatory Visit: Payer: Self-pay | Admitting: Orthopedic Surgery

## 2015-12-19 DIAGNOSIS — Z01812 Encounter for preprocedural laboratory examination: Secondary | ICD-10-CM | POA: Diagnosis not present

## 2015-12-19 DIAGNOSIS — M4806 Spinal stenosis, lumbar region: Secondary | ICD-10-CM | POA: Diagnosis not present

## 2015-12-19 HISTORY — DX: Foreign body in vulva and vagina, initial encounter: T19.2XXA

## 2015-12-19 HISTORY — DX: Other specified postprocedural states: Z98.890

## 2015-12-19 HISTORY — DX: Dorsalgia, unspecified: M54.9

## 2015-12-19 HISTORY — DX: Other specified postprocedural states: R11.2

## 2015-12-19 HISTORY — DX: Nausea with vomiting, unspecified: R11.2

## 2015-12-19 LAB — SURGICAL PCR SCREEN
MRSA, PCR: NEGATIVE
STAPHYLOCOCCUS AUREUS: NEGATIVE

## 2015-12-19 NOTE — Progress Notes (Signed)
Urgent need for MD orders for Epic-pt here now for PAT appt.

## 2015-12-19 NOTE — Patient Instructions (Addendum)
[name]  12/19/2015   Your procedure is scheduled on: 12-28-15   Report to [facility]  Entrance take [facility]  elevators to 3rd floor to  [place] at  [place] AM.  Call this number if you have problems the morning of surgery [phone]   Remember: ONLY 1 PERSON MAY GO WITH YOU TO SHORT STAY TO GET  READY MORNING OF [name].  Do not eat food or drink liquids :After Midnight.     Take these medicines the morning of surgery with A SIP OF WATER: Metoprolol. DO NOT TAKE ANY DIABETIC MEDICATIONS DAY OF YOUR SURGERY                               You may not have any metal on your body including hair pins and              piercings  Do not wear jewelry, make-up, lotions, powders or perfumes, deodorant             Do not wear nail polish.  Do not shave  48 hours prior to surgery.              Men may shave face and neck.   Do not bring valuables to the hospital. [place].  Contacts, dentures or bridgework may not be worn into surgery.  Leave suitcase in the car. After surgery it may be brought to your room.     Patients discharged the day of surgery will not be allowed to drive home.  Name and phone number of your driver: [name] [phone] cell  Special Instructions: N/A              Please read over the following fact sheets you were given: _____________________________________________________________________             [facility] - Preparing for Surgery Before surgery, you can play an important role.  Because skin is not sterile, your skin needs to be as free of germs as possible.  You can reduce the number of germs on your skin by washing with CHG (chlorahexidine gluconate) soap before surgery.  CHG is an antiseptic cleaner which kills germs and bonds with the skin to continue killing germs even after washing. Please DO NOT use if you have an allergy to CHG or  antibacterial soaps.  If your skin becomes reddened/irritated stop using the CHG and inform your nurse when you arrive at Short Stay. Do not shave (including legs and underarms) for at least 48 hours prior to the first CHG shower.  You may shave your face/neck. Please follow these instructions carefully:  1.  Shower with CHG Soap the night before surgery and the  morning of Surgery.  2.  If you choose to wash your hair, wash your hair first as usual with your  normal  shampoo.  3.  After you shampoo, rinse your hair and body thoroughly to remove the  shampoo.                           4.  Use CHG as you would any other liquid soap.  You can apply chg directly  to  the skin and wash                       Gently with a scrungie or clean washcloth.  5.  Apply the CHG Soap to your body ONLY FROM THE NECK DOWN.   Do not use on face/ open                           Wound or open sores. Avoid contact with eyes, ears mouth and genitals (private parts).                       Wash face,  Genitals (private parts) with your normal soap.             6.  Wash thoroughly, paying special attention to the area where your surgery  will be performed.  7.  Thoroughly rinse your body with warm water from the neck down.  8.  DO NOT shower/wash with your normal soap after using and rinsing off  the CHG Soap.                9.  Pat yourself dry with a clean towel.            10.  Wear clean pajamas.            11.  Place clean sheets on your bed the night of your first shower and do not  sleep with pets. Day of Surgery : Do not apply any lotions/deodorants the morning of surgery.  Please wear clean clothes to the hospital/surgery center.  FAILURE TO FOLLOW THESE INSTRUCTIONS MAY RESULT IN THE CANCELLATION OF YOUR SURGERY PATIENT SIGNATURE_________________________________  NURSE SIGNATURE__________________________________  ________________________________________________________________________

## 2015-12-19 NOTE — Pre-Procedure Instructions (Signed)
12-17-15 CBC,CMP(Dr. Holwerda,PCP) results with chart. EKG,Stress,Heart Cath, CXR 6'16 -reports Epic

## 2015-12-24 ENCOUNTER — Telehealth: Payer: Self-pay | Admitting: *Deleted

## 2015-12-24 NOTE — Telephone Encounter (Signed)
Pt states had surgery with Dr. Blenda Mounts years ago and had severe sickness after the anesthesia with the 1st foot - right foot, and Dr. Blenda Mounts changed the anesthesia with the left foot and she had no problems.  I reviewed her surgical records and was unable to find anesthesia changes.  I spoke with Clarise Cruz at [facility] and she said she would contact pt with the information.  I informed pt to expect the call.

## 2015-12-25 ENCOUNTER — Ambulatory Visit: Payer: Self-pay | Admitting: Orthopedic Surgery

## 2015-12-25 NOTE — H&P (Signed)
[name] is an 55 y.o. female.   Chief Complaint: back and B/L leg pain HPI: The patient is a 55 year old female who presents with back pain. The patient is here today for a surgical consult and in referral from Dr. Nelva Bush. The patient reports low back symptoms which began 2 month(s) ago without any known injury. Symptoms are reported to be located in the low back and Symptoms include pain. The pain radiates to the left thigh and left lower leg. The patient feels as if the symptoms are worsening. Symptoms are exacerbated by standing and sitting. Prior to being seen today the patient was previously evaluated by her PCP and Dr. Nelva Bush. Past evaluation has included x-ray of the lumbar spine and MRI of the lumbar spine. Past treatment has included opioid analgesics (Hydrocodone), muscle relaxants (Methocarbamol), corticosteroids (Prednisone), ice packs, heating pad and cortisone injection in left hip. The patient states that this is not a Designer, jewellery case.  Past Medical History  Diagnosis Date  . Hypertension   . Back pain     related to spinal stenosis and disc problem, radiates down left buttocks to leg., weakness occ.  [name] PONV (postoperative nausea and vomiting)   . Vaginal foreign object     "Uses Femring"    Past Surgical History  Procedure Laterality Date  . Abdominal hysterectomy    . Foot surgery Bilateral     [place] "bunion,bone spur, tendon" (1) -6'16, (1)-10'16  . Cardiac catheterization N/A 04/18/2015    Procedure: Left Heart Cath and Coronary Angiography;  Surgeon: [name], MD;  Location: [place] [facility];  Service: Cardiovascular;  Laterality: N/A;  . Wisdom tooth extraction      No family history on file. Social History:  reports that she has never smoked. She does not have any smokeless tobacco history on file. She reports that she does not drink alcohol or use illicit drugs.  Allergies:  Allergies  Allergen Reactions  . Penicillins  Anaphylaxis and Hives     (Not in a hospital admission)  No results found for this or any previous visit (from the past 48 hour(s)). No results found.  Review of Systems  Constitutional: Negative.   HENT: Negative.   Eyes: Negative.   Respiratory: Negative.   Cardiovascular: Negative.   Gastrointestinal: Negative.   Genitourinary: Negative.   Musculoskeletal: Positive for back pain.  Skin: Negative.   Neurological: Positive for sensory change and focal weakness.    There were no vitals taken for this visit. Physical Exam  Constitutional: She is oriented to person, place, and time. She appears well-developed.  HENT:  Head: Normocephalic.  Eyes: Pupils are equal, round, and reactive to light.  Neck: Normal range of motion.  Cardiovascular: Normal rate.   Respiratory: Effort normal.  GI: Soft.  Musculoskeletal:  On exam, moderate distress. Mood and affect appropriate. Walks with an antalgic gait. Straight leg raises left buttock, thigh, calf pain, exacerbated with dorsum augmentation maneuver. Mild on the right. EHL is 5-/5 in comparison.  Slightly awkward sensation in the L5 dermatome. Lumbar spine exam reveals no evidence of soft tissue swelling, deformity or skin ecchymosis. On palpation there is no tenderness of the lumbar spine. No flank pain with percussion. The abdomen is soft and nontender. Nontender over the trochanters. No cellulitis or lymphadenopathy.  Good range of motion of the lumbar spine without associated pain. Motor is 5/5 including tibialis anterior, plantar flexion, quadriceps and hamstrings. Patient is normoreflexic. There is  no Babinski or clonus. Sensory exam is intact to light touch. Patient has good distal pulses. No DVT. No pain and normal range of motion without instability of the hips, knees and ankles.  Neurological: She is alert and oriented to person, place, and time. She has normal reflexes.  Skin: Skin is warm and dry.    Three view x-rays AP,  lateral, flexion extension demonstrates disc space narrowing at 4-5, no instability in flexion extension.  MRI was reviewed, which demonstrates multifactorial spinal stenosis at L4-5, which is severe in the lateral recess. Moderate-to-severe centrally, circumferential disc protrusion, some narrowing of the neural foramen at L4 on the left. Mild facet arthropathy at 3-4.  Assessment/Plan Spinal stenosis L4-5 Left lower extremity radicular pain L5 nerve root distribution secondary to multifactorial spinal stenosis at L4-5, severe in the lateral recesses, myotomal weakness, dermatomal dysesthesias.  I had extensive discussion with [name] concerning the current pathology, relevant anatomy and treatment options. She does work over at [employer]. We discussed activity modification, avoid extension, favor flexion, analgesics as well as an epidural steroid injection. She is currently not interested in epidural steroid injection at this point in time as she feels that they would be temporary. We discussed other options, activity modification at work as well as a lumbar decompression. We discussed microlumbar decompression at L4-5. I had an extensive discussion of the risks and benefits of the lumbar decompression with the patient including bleeding, infection, damage to neurovascular structures, epidural fibrosis, CSF leak requiring repair. We also discussed increase in pain, adjacent segment disease, recurrent disc herniation, need for future surgery including repeat decompression and/or fusion. We also discussed risks of postoperative hematoma, paralysis, anesthetic complications including DVT, PE, death, cardiopulmonary dysfunction. In addition, the perioperative and postoperative courses were discussed in detail including the rehabilitative time and return to functional activity and work. I provided the patient with an illustrated handout and utilized the appropriate surgical models. My concern is that she  will have residual symptoms related to her disc degeneration, facet arthropathy and we specifically discussed the possibly of recurrent disc herniation, residual symptomatology affecting her ability to sit long stand, long band, etc. This started about two years ago. She had a fall in the snow on her way to work and has had persistent symptoms since, she is fairly disabled by her symptoms she is reporting, having to continually take analgesics. She is currently taking hydrocodone. We discussed the operative procedure, overnight in the hospital, out of work for approximately 6 weeks to 12 weeks. Potentially earlier depending upon her response. No history of MRSA or DVT. She is otherwise healthy, is not a smoker. Again, we specifically discussed the risks and benefits of surgery versus conservative treatment. We provided her with illustrated handout and discussed in detail. We will proceed accordingly. We discussed possible bilateral decompression depending upon her symptomatology. Again, overall in duration this has been eight weeks of an exacerbation and on and off symptoms for two years. She has been doing a home exercise program with activity modification as well. In the interim again avoid extension, favor flexion. She has again tried therapy in the past for her back and has been attempting that, but has been unable to receive any benefit due to the pain radiating into her leg. Again, we will proceed with decompression.  Plan microlumbar decompression L4-5 bilateral  Cecilie Kicks., PA-C for [name] 12/25/2015, 12:01 PM

## 2015-12-28 ENCOUNTER — Ambulatory Visit (HOSPITAL_COMMUNITY)
Admission: RE | Admit: 2015-12-28 | Discharge: 2015-12-31 | Disposition: A | Discharge status: Home / Self Care | Payer: 59 | Source: Ambulatory Visit | Attending: Specialist | Admitting: Specialist

## 2015-12-28 ENCOUNTER — Ambulatory Visit (HOSPITAL_COMMUNITY): Payer: 59 | Admitting: Anesthesiology

## 2015-12-28 ENCOUNTER — Encounter (HOSPITAL_COMMUNITY)
Admission: RE | Disposition: A | Discharge status: Home / Self Care | Payer: Self-pay | Source: Ambulatory Visit | Attending: Specialist

## 2015-12-28 ENCOUNTER — Ambulatory Visit (HOSPITAL_COMMUNITY): Payer: 59

## 2015-12-28 ENCOUNTER — Encounter (HOSPITAL_COMMUNITY): Payer: Self-pay | Admitting: *Deleted

## 2015-12-28 DIAGNOSIS — M479 Spondylosis, unspecified: Secondary | ICD-10-CM | POA: Insufficient documentation

## 2015-12-28 DIAGNOSIS — Z419 Encounter for procedure for purposes other than remedying health state, unspecified: Secondary | ICD-10-CM

## 2015-12-28 DIAGNOSIS — Z01818 Encounter for other preprocedural examination: Secondary | ICD-10-CM | POA: Diagnosis not present

## 2015-12-28 DIAGNOSIS — I10 Essential (primary) hypertension: Secondary | ICD-10-CM | POA: Diagnosis not present

## 2015-12-28 DIAGNOSIS — M4806 Spinal stenosis, lumbar region: Secondary | ICD-10-CM | POA: Insufficient documentation

## 2015-12-28 DIAGNOSIS — M545 Low back pain: Secondary | ICD-10-CM | POA: Diagnosis not present

## 2015-12-28 DIAGNOSIS — K5900 Constipation, unspecified: Secondary | ICD-10-CM | POA: Insufficient documentation

## 2015-12-28 DIAGNOSIS — Z9889 Other specified postprocedural states: Secondary | ICD-10-CM | POA: Diagnosis not present

## 2015-12-28 DIAGNOSIS — M549 Dorsalgia, unspecified: Secondary | ICD-10-CM | POA: Diagnosis present

## 2015-12-28 DIAGNOSIS — M5116 Intervertebral disc disorders with radiculopathy, lumbar region: Secondary | ICD-10-CM | POA: Insufficient documentation

## 2015-12-28 DIAGNOSIS — M48061 Spinal stenosis, lumbar region without neurogenic claudication: Secondary | ICD-10-CM | POA: Diagnosis present

## 2015-12-28 HISTORY — PX: LUMBAR LAMINECTOMY/DECOMPRESSION MICRODISCECTOMY: SHX5026

## 2015-12-28 SURGERY — LUMBAR LAMINECTOMY/DECOMPRESSION MICRODISCECTOMY
Anesthesia: General | Laterality: Bilateral

## 2015-12-28 MED ORDER — PROPOFOL 10 MG/ML IV BOLUS
INTRAVENOUS | Status: DC | PRN
  Administered 2015-12-28: 150 mg via INTRAVENOUS

## 2015-12-28 MED ORDER — OXYCODONE HCL 5 MG/5ML PO SOLN
5.0000 mg | Freq: Once | ORAL | Status: DC | PRN
  Filled 2015-12-28: qty 5

## 2015-12-28 MED ORDER — ACETAMINOPHEN 650 MG RE SUPP: 650.0000 mg | RECTAL | Status: DC | PRN

## 2015-12-28 MED ORDER — BISACODYL 5 MG PO TBEC: 5.0000 mg | DELAYED_RELEASE_TABLET | Freq: Every day | ORAL | Status: DC | PRN

## 2015-12-28 MED ORDER — ROCURONIUM BROMIDE 100 MG/10ML IV SOLN
INTRAVENOUS | Status: DC | PRN
  Administered 2015-12-28: 10 mg via INTRAVENOUS
  Administered 2015-12-28: 40 mg via INTRAVENOUS

## 2015-12-28 MED ORDER — OXYCODONE HCL 5 MG PO TABS: 5.0000 mg | ORAL_TABLET | Freq: Once | ORAL | Status: DC | PRN

## 2015-12-28 MED ORDER — DOCUSATE SODIUM 100 MG PO CAPS: 100.0000 mg | ORAL_CAPSULE | Freq: Two times a day (BID) | ORAL | Status: DC | PRN

## 2015-12-28 MED ORDER — ROCURONIUM BROMIDE 100 MG/10ML IV SOLN
INTRAVENOUS | Status: AC
  Filled 2015-12-28: qty 1

## 2015-12-28 MED ORDER — MAGNESIUM CITRATE PO SOLN: 1.0000 | Freq: Once | ORAL | Status: DC | PRN

## 2015-12-28 MED ORDER — MIDAZOLAM HCL 5 MG/5ML IJ SOLN
INTRAMUSCULAR | Status: DC | PRN
  Administered 2015-12-28: 2 mg via INTRAVENOUS

## 2015-12-28 MED ORDER — FENTANYL CITRATE (PF) 100 MCG/2ML IJ SOLN
INTRAMUSCULAR | Status: AC
  Filled 2015-12-28: qty 2

## 2015-12-28 MED ORDER — HYDROMORPHONE HCL 1 MG/ML IJ SOLN
0.2500 mg | INTRAMUSCULAR | Status: DC | PRN
  Administered 2015-12-28: 0.5 mg via INTRAVENOUS
  Administered 2015-12-28 (×2): 0.25 mg via INTRAVENOUS

## 2015-12-28 MED ORDER — SENNOSIDES-DOCUSATE SODIUM 8.6-50 MG PO TABS
1.0000 | ORAL_TABLET | Freq: Every evening | ORAL | Status: DC | PRN
Start: 2015-12-28 — End: 2015-12-31
  Filled 2015-12-28: qty 1

## 2015-12-28 MED ORDER — PROPOFOL 10 MG/ML IV BOLUS
INTRAVENOUS | Status: AC
  Filled 2015-12-28: qty 20

## 2015-12-28 MED ORDER — HYDROMORPHONE HCL 1 MG/ML IJ SOLN
INTRAMUSCULAR | Status: AC
  Filled 2015-12-28: qty 1

## 2015-12-28 MED ORDER — ONDANSETRON HCL 4 MG/2ML IJ SOLN
INTRAMUSCULAR | Status: AC
  Filled 2015-12-28: qty 2

## 2015-12-28 MED ORDER — VANCOMYCIN HCL IN DEXTROSE 1-5 GM/200ML-% IV SOLN
1000.0000 mg | Freq: Once | INTRAVENOUS | Status: AC
  Administered 2015-12-29: 1000 mg via INTRAVENOUS
  Filled 2015-12-28 (×2): qty 200

## 2015-12-28 MED ORDER — DIPHENHYDRAMINE HCL 50 MG/ML IJ SOLN
12.5000 mg | Freq: Four times a day (QID) | INTRAMUSCULAR | Status: DC | PRN
  Administered 2015-12-28: 12.5 mg via INTRAVENOUS

## 2015-12-28 MED ORDER — ONDANSETRON HCL 4 MG/2ML IJ SOLN
4.0000 mg | Freq: Once | INTRAMUSCULAR | Status: AC
  Administered 2015-12-28: 4 mg via INTRAVENOUS

## 2015-12-28 MED ORDER — OXYCODONE-ACETAMINOPHEN 5-325 MG PO TABS
1.0000 | ORAL_TABLET | ORAL | Status: DC | PRN
  Administered 2015-12-29 – 2015-12-31 (×9): 2 via ORAL
  Filled 2015-12-28 (×9): qty 2

## 2015-12-28 MED ORDER — SODIUM CHLORIDE 0.9 % IR SOLN
Status: AC
  Filled 2015-12-28: qty 1

## 2015-12-28 MED ORDER — PROMETHAZINE HCL 25 MG/ML IJ SOLN
6.2500 mg | Freq: Once | INTRAMUSCULAR | Status: AC
  Administered 2015-12-28: 12.5 mg via INTRAVENOUS

## 2015-12-28 MED ORDER — DIPHENHYDRAMINE HCL 50 MG/ML IJ SOLN
12.5000 mg | Freq: Four times a day (QID) | INTRAMUSCULAR | Status: DC | PRN
  Filled 2015-12-28: qty 1

## 2015-12-28 MED ORDER — ACETAMINOPHEN 325 MG PO TABS: 650.0000 mg | ORAL_TABLET | ORAL | Status: DC | PRN

## 2015-12-28 MED ORDER — DIPHENHYDRAMINE HCL 25 MG PO CAPS: 25.0000 mg | ORAL_CAPSULE | Freq: Four times a day (QID) | ORAL | Status: DC | PRN

## 2015-12-28 MED ORDER — SUGAMMADEX SODIUM 200 MG/2ML IV SOLN
INTRAVENOUS | Status: AC
  Filled 2015-12-28: qty 2

## 2015-12-28 MED ORDER — SODIUM CHLORIDE 0.9 % IJ SOLN
INTRAMUSCULAR | Status: AC
Start: 2015-12-28 — End: 2015-12-28
  Filled 2015-12-28: qty 10

## 2015-12-28 MED ORDER — METHOCARBAMOL 500 MG PO TABS
500.0000 mg | ORAL_TABLET | Freq: Four times a day (QID) | ORAL | Status: DC | PRN
  Administered 2015-12-28 – 2015-12-31 (×8): 500 mg via ORAL
  Filled 2015-12-28 (×8): qty 1

## 2015-12-28 MED ORDER — THROMBIN 5000 UNITS EX SOLR
CUTANEOUS | Status: DC | PRN
  Administered 2015-12-28: 10000 [IU] via TOPICAL

## 2015-12-28 MED ORDER — ALUM & MAG HYDROXIDE-SIMETH 200-200-20 MG/5ML PO SUSP: 30.0000 mL | Freq: Four times a day (QID) | ORAL | Status: DC | PRN

## 2015-12-28 MED ORDER — PHENYLEPHRINE 40 MCG/ML (10ML) SYRINGE FOR IV PUSH (FOR BLOOD PRESSURE SUPPORT)
PREFILLED_SYRINGE | INTRAVENOUS | Status: AC
  Filled 2015-12-28: qty 10

## 2015-12-28 MED ORDER — SODIUM CHLORIDE 0.9 % IR SOLN
Status: DC | PRN
  Administered 2015-12-28: 500 mL

## 2015-12-28 MED ORDER — METHOCARBAMOL 500 MG PO TABS: 500.0000 mg | ORAL_TABLET | Freq: Three times a day (TID) | ORAL | Status: DC | PRN

## 2015-12-28 MED ORDER — EPHEDRINE SULFATE 50 MG/ML IJ SOLN
INTRAMUSCULAR | Status: DC | PRN
  Administered 2015-12-28: 5 mg via INTRAVENOUS

## 2015-12-28 MED ORDER — DEXAMETHASONE SODIUM PHOSPHATE 10 MG/ML IJ SOLN
INTRAMUSCULAR | Status: AC
  Filled 2015-12-28: qty 1

## 2015-12-28 MED ORDER — ASPIRIN 81 MG PO TBEC: 81.0000 mg | DELAYED_RELEASE_TABLET | Freq: Every day | ORAL | Status: DC

## 2015-12-28 MED ORDER — THROMBIN 5000 UNITS EX SOLR
CUTANEOUS | Status: AC
  Filled 2015-12-28: qty 10000

## 2015-12-28 MED ORDER — PROMETHAZINE HCL 25 MG/ML IJ SOLN
INTRAMUSCULAR | Status: AC
  Filled 2015-12-28: qty 1

## 2015-12-28 MED ORDER — PHENOL 1.4 % MT LIQD
1.0000 | OROMUCOSAL | Status: DC | PRN
  Filled 2015-12-28: qty 177

## 2015-12-28 MED ORDER — PHENYLEPHRINE HCL 10 MG/ML IJ SOLN
INTRAMUSCULAR | Status: DC | PRN
  Administered 2015-12-28 (×2): 40 ug via INTRAVENOUS

## 2015-12-28 MED ORDER — DOCUSATE SODIUM 100 MG PO CAPS
100.0000 mg | ORAL_CAPSULE | Freq: Two times a day (BID) | ORAL | Status: DC
  Administered 2015-12-28 – 2015-12-31 (×7): 100 mg via ORAL

## 2015-12-28 MED ORDER — LACTATED RINGERS IV SOLN
INTRAVENOUS | Status: DC
  Administered 2015-12-28: 06:00:00 via INTRAVENOUS

## 2015-12-28 MED ORDER — ONDANSETRON HCL 4 MG/2ML IJ SOLN
4.0000 mg | INTRAMUSCULAR | Status: DC | PRN
  Administered 2015-12-28 – 2015-12-29 (×3): 4 mg via INTRAVENOUS
  Filled 2015-12-28 (×3): qty 2

## 2015-12-28 MED ORDER — HYDROMORPHONE HCL 1 MG/ML IJ SOLN
0.5000 mg | INTRAMUSCULAR | Status: DC | PRN
  Administered 2015-12-28: 1 mg via INTRAVENOUS
  Filled 2015-12-28: qty 1

## 2015-12-28 MED ORDER — ESTRADIOL ACETATE 0.05 MG/24HR VA RING: 1.0000 | VAGINAL_RING | VAGINAL | Status: DC

## 2015-12-28 MED ORDER — HYDROXYZINE HCL 50 MG/ML IM SOLN
25.0000 mg | Freq: Three times a day (TID) | INTRAMUSCULAR | Status: DC | PRN
  Administered 2015-12-28: 25 mg via INTRAMUSCULAR
  Filled 2015-12-28 (×3): qty 0.5

## 2015-12-28 MED ORDER — FENTANYL CITRATE (PF) 250 MCG/5ML IJ SOLN
INTRAMUSCULAR | Status: AC
  Filled 2015-12-28: qty 5

## 2015-12-28 MED ORDER — EPHEDRINE SULFATE 50 MG/ML IJ SOLN
INTRAMUSCULAR | Status: AC
  Filled 2015-12-28: qty 1

## 2015-12-28 MED ORDER — DEXAMETHASONE SODIUM PHOSPHATE 10 MG/ML IJ SOLN
INTRAMUSCULAR | Status: DC | PRN
  Administered 2015-12-28: 10 mg via INTRAVENOUS

## 2015-12-28 MED ORDER — MIDAZOLAM HCL 2 MG/2ML IJ SOLN
INTRAMUSCULAR | Status: AC
  Filled 2015-12-28: qty 2

## 2015-12-28 MED ORDER — KCL IN DEXTROSE-NACL 20-5-0.45 MEQ/L-%-% IV SOLN
INTRAVENOUS | Status: AC
  Administered 2015-12-28: 16:00:00 via INTRAVENOUS
  Filled 2015-12-28 (×2): qty 1000

## 2015-12-28 MED ORDER — FENTANYL CITRATE (PF) 100 MCG/2ML IJ SOLN
INTRAMUSCULAR | Status: DC | PRN
  Administered 2015-12-28: 100 ug via INTRAVENOUS
  Administered 2015-12-28 (×3): 50 ug via INTRAVENOUS

## 2015-12-28 MED ORDER — METOPROLOL SUCCINATE ER 50 MG PO TB24
50.0000 mg | ORAL_TABLET | Freq: Every day | ORAL | Status: DC
  Administered 2015-12-28 – 2015-12-31 (×3): 50 mg via ORAL
  Filled 2015-12-28 (×4): qty 1

## 2015-12-28 MED ORDER — HYDROCODONE-ACETAMINOPHEN 5-325 MG PO TABS
1.0000 | ORAL_TABLET | ORAL | Status: DC | PRN
  Administered 2015-12-28 – 2015-12-29 (×4): 2 via ORAL
  Filled 2015-12-28 (×5): qty 2

## 2015-12-28 MED ORDER — ONDANSETRON HCL 4 MG/2ML IJ SOLN
INTRAMUSCULAR | Status: DC | PRN
  Administered 2015-12-28: 4 mg via INTRAVENOUS

## 2015-12-28 MED ORDER — OXYCODONE-ACETAMINOPHEN 7.5-325 MG PO TABS: 1.0000 | ORAL_TABLET | ORAL | Status: DC | PRN

## 2015-12-28 MED ORDER — VANCOMYCIN HCL IN DEXTROSE 1-5 GM/200ML-% IV SOLN
1000.0000 mg | INTRAVENOUS | Status: AC
  Administered 2015-12-28: 1000 mg via INTRAVENOUS
  Filled 2015-12-28: qty 200

## 2015-12-28 MED ORDER — FENTANYL CITRATE (PF) 100 MCG/2ML IJ SOLN
25.0000 ug | INTRAMUSCULAR | Status: DC | PRN
  Administered 2015-12-28 (×4): 25 ug via INTRAVENOUS

## 2015-12-28 MED ORDER — MEPERIDINE HCL 50 MG/ML IJ SOLN: 6.2500 mg | INTRAMUSCULAR | Status: DC | PRN

## 2015-12-28 MED ORDER — DIPHENHYDRAMINE HCL 25 MG PO CAPS
25.0000 mg | ORAL_CAPSULE | Freq: Four times a day (QID) | ORAL | Status: DC | PRN
  Filled 2015-12-28: qty 1

## 2015-12-28 MED ORDER — NITROGLYCERIN 0.4 MG SL SUBL: 0.4000 mg | SUBLINGUAL_TABLET | SUBLINGUAL | Status: DC | PRN

## 2015-12-28 MED ORDER — MENTHOL 3 MG MT LOZG: 1.0000 | LOZENGE | OROMUCOSAL | Status: DC | PRN

## 2015-12-28 MED ORDER — BUPIVACAINE-EPINEPHRINE (PF) 0.5% -1:200000 IJ SOLN
INTRAMUSCULAR | Status: AC
  Filled 2015-12-28: qty 30

## 2015-12-28 MED ORDER — SUGAMMADEX SODIUM 200 MG/2ML IV SOLN
INTRAVENOUS | Status: DC | PRN
  Administered 2015-12-28: 160 mg via INTRAVENOUS

## 2015-12-28 MED ORDER — METHOCARBAMOL 1000 MG/10ML IJ SOLN
500.0000 mg | Freq: Four times a day (QID) | INTRAVENOUS | Status: DC | PRN
  Administered 2015-12-28: 500 mg via INTRAVENOUS
  Filled 2015-12-28 (×3): qty 5

## 2015-12-28 MED ORDER — RISAQUAD PO CAPS
1.0000 | ORAL_CAPSULE | Freq: Every day | ORAL | Status: DC
Start: 2015-12-28 — End: 2015-12-31
  Administered 2015-12-28 – 2015-12-31 (×4): 1 via ORAL
  Filled 2015-12-28 (×4): qty 1

## 2015-12-28 MED ORDER — BUPIVACAINE-EPINEPHRINE 0.5% -1:200000 IJ SOLN
INTRAMUSCULAR | Status: DC | PRN
  Administered 2015-12-28: 15 mL

## 2015-12-28 SURGICAL SUPPLY — 44 items
BAG SPEC THK2 15X12 ZIP CLS (MISCELLANEOUS)
BAG ZIPLOCK 12X15 (MISCELLANEOUS) IMPLANT
CLOTH 2% CHLOROHEXIDINE 3PK (PERSONAL CARE ITEMS) ×2 IMPLANT
DRAPE MICROSCOPE LEICA (MISCELLANEOUS) ×2 IMPLANT
DRAPE SHEET LG 3/4 BI-LAMINATE (DRAPES) ×1 IMPLANT
DRAPE SURG 17X11 SM STRL (DRAPES) ×2 IMPLANT
DRAPE UTILITY XL STRL (DRAPES) ×2 IMPLANT
DRSG AQUACEL AG ADV 3.5X 4 (GAUZE/BANDAGES/DRESSINGS) ×1 IMPLANT
DRSG AQUACEL AG ADV 3.5X 6 (GAUZE/BANDAGES/DRESSINGS) IMPLANT
DURAPREP 26ML APPLICATOR (WOUND CARE) ×2 IMPLANT
DURASEAL SPINE SEALANT 3ML (MISCELLANEOUS) IMPLANT
ELECT BLADE TIP CTD 4 INCH (ELECTRODE) ×2 IMPLANT
ELECT REM PT RETURN 9FT ADLT (ELECTROSURGICAL) ×2
ELECTRODE REM PT RTRN 9FT ADLT (ELECTROSURGICAL) ×1 IMPLANT
GLOVE BIOGEL PI IND STRL 7.0 (GLOVE) ×1 IMPLANT
GLOVE BIOGEL PI INDICATOR 7.0 (GLOVE) ×1
GLOVE SURG SS PI 7.0 STRL IVOR (GLOVE) ×2 IMPLANT
GLOVE SURG SS PI 7.5 STRL IVOR (GLOVE) ×4 IMPLANT
GLOVE SURG SS PI 8.0 STRL IVOR (GLOVE) ×4 IMPLANT
GOWN STRL REUS W/TWL XL LVL3 (GOWN DISPOSABLE) ×6 IMPLANT
IV CATH 14GX2 1/4 (CATHETERS) ×1 IMPLANT
KIT BASIN OR (CUSTOM PROCEDURE TRAY) ×2 IMPLANT
KIT POSITIONING SURG ANDREWS (MISCELLANEOUS) ×2 IMPLANT
MANIFOLD NEPTUNE II (INSTRUMENTS) ×2 IMPLANT
NDL SPNL 18GX3.5 QUINCKE PK (NEEDLE) ×2 IMPLANT
NEEDLE SPNL 18GX3.5 QUINCKE PK (NEEDLE) ×4 IMPLANT
PACK LAMINECTOMY ORTHO (CUSTOM PROCEDURE TRAY) ×2 IMPLANT
PATTIES SURGICAL .5 X.5 (GAUZE/BANDAGES/DRESSINGS) ×1 IMPLANT
PATTIES SURGICAL .75X.75 (GAUZE/BANDAGES/DRESSINGS) ×2 IMPLANT
RUBBERBAND STERILE (MISCELLANEOUS) ×4 IMPLANT
SPONGE SURGIFOAM ABS GEL 100 (HEMOSTASIS) ×2 IMPLANT
STAPLER VISISTAT (STAPLE) IMPLANT
STRIP CLOSURE SKIN 1/2X4 (GAUZE/BANDAGES/DRESSINGS) IMPLANT
SUT NURALON 4 0 TR CR/8 (SUTURE) IMPLANT
SUT PROLENE 3 0 PS 2 (SUTURE) ×1 IMPLANT
SUT VIC AB 1 CT1 27 (SUTURE) ×2
SUT VIC AB 1 CT1 27XBRD ANTBC (SUTURE) IMPLANT
SUT VIC AB 1-0 CT2 27 (SUTURE) ×1 IMPLANT
SUT VIC AB 2-0 CT1 27 (SUTURE)
SUT VIC AB 2-0 CT1 TAPERPNT 27 (SUTURE) IMPLANT
SUT VIC AB 2-0 CT2 27 (SUTURE) IMPLANT
SYR 3ML LL SCALE MARK (SYRINGE) ×1 IMPLANT
TOWEL OR 17X26 10 PK STRL BLUE (TOWEL DISPOSABLE) ×2 IMPLANT
YANKAUER SUCT BULB TIP NO VENT (SUCTIONS) ×2 IMPLANT

## 2015-12-28 NOTE — Transfer of Care (Signed)
Immediate Anesthesia Transfer of Care Note  Patient: [name]  Procedure(s) Performed: Procedure(s): MICRO LUMBAR DECOMPRESSION L4 - L5 BILATERALLY (Bilateral)  Patient Location: PACU  Anesthesia Type:General  Level of Consciousness:  sedated, patient cooperative and responds to stimulation  Airway & Oxygen Therapy:Patient Spontanous Breathing and Patient connected to face mask oxgen  Post-op Assessment:  Report given to PACU RN and Post -op Vital signs reviewed and stable  Post vital signs:  Reviewed and stable  Last Vitals:  Filed Vitals:   12/28/15 0535  BP: 126/87  Pulse: 72  Temp: 36.5 C  Resp: 18    Complications: No apparent anesthesia complications

## 2015-12-28 NOTE — Op Note (Signed)
[name], [name]           ACCOUNT NO.:  [id number]  MEDICAL RECORD NO.:  [id number]  LOCATION:  [facility]                         FACILITY:  [facility]  PHYSICIAN:  [name], M.D.    DATE OF BIRTH:  06/21/1961  DATE OF PROCEDURE:  12/28/2015 DATE OF DISCHARGE:                              OPERATIVE REPORT   PREOPERATIVE DIAGNOSIS:  Spinal stenosis L4-5 bilaterally with L4-L5 radiculopathy.  POSTOPERATIVE DIAGNOSIS:  Spinal stenosis L4-5 bilaterally with L4-L5 radiculopathy.  PROCEDURE PERFORMED:  Bilateral microlumbar decompression L4-5, with bilateral L4 and L5 foraminotomies.  ANESTHESIA:  General.  ASSISTANT:  Lacie Draft, PA  HISTORY:  A 55 year old with left greater than right lower extremity radicular pain secondary to spinal stenosis at 4-5, multifactorial facet arthropathy, ligamentum flavum hypertrophy.  She did have disk degeneration predominantly leg pain over back pain.  No instability noted.  She did have bilaterally L5 myotomal weakness, dermatomal dysesthesias, she has been refractory to conservative treatment, was indicated for bilateral decompression at 4-5.  Risks and benefits were discussed including bleeding, infection, damage to neurovascular structures, no change in symptoms, worsening symptoms, DVT, PE, anesthetic complications, residual back condition, disk degeneration may require fusion in the future, residual back pain, etc.  TECHNIQUE:  Patient in supine position, after induction of adequate general anesthesia and a g of vancomycin, she was placed prone on the [place] frame.  All bony prominences were well padded.  Foley to gravity.  Lumbar region was prepped and draped in usual sterile fashion. Two 18-gauge spinal needles utilized to localize L4-5 interspace, confirmed with x-ray.  Incision was made from the spinous process L4-5, subcutaneous tissue was dissected.  Electrocautery was utilized to achieve hemostasis.  Dorsolumbar fascia was  divided on either side of the interspinous ligament.  Divided the paraspinous musculature. McCulloch retractor was placed.  Operating microscope was draped and brought on the surgical field.  Attention was turned towards the left 1st after confirmatory radiograph.  Hemilaminotomy of the caudad edge of L4 was performed with a Leksell rongeur and a 3 mm Kerrison cephalad to detach the ligamentum flavum.  Straight micro curette was utilized to detach the ligamentum flavum from the cephalad edge of S1 of 5.  Facet and ligamentum flavum hypertrophy was noted particularly at the superior articulating process of 5.  Entered the epidural space with a curette and a Penfield, protected the neural elements and performed a generous foraminotomy of L5 and with the neural elements protected, we decompressed the lateral recess to the medial border of the pedicle.  We removed the ligamentum flavum from the interspace performing a foraminotomy of L4 as well.  There was compression of the 5 root into the lateral recess and into the foramen, some narrowing of the foramen of 4 was noted as well slightly stenotic due to the disk space narrowing.  There was a bulging disk and herniated disk noted.  Bipolar electrocautery was utilized to achieve hemostasis as there was an epidural venous plexus.  We decompressed to the pedicle of 4 and neural probe passed freely up the foramen of 4 and 5.  Following this, checked beneath the thecal sac, the axilla and the shoulder of the root, no disk herniation or  extruded fragment.  No CSF leakage or active bleeding. Confirmatory radiograph obtained confirming the level and the foramen. Copiously irrigated the wound.  Thrombin-soaked Gelfoam was placed in the left in a similar fashion.  We placed a Amelia Jo on the right, hemilaminotomy of the caudad edge of 4, and the cephalad edge of 5 was performed after detaching the ligamentum flavum with a straight micro curette and  placed neuro patties and protecting the neural elements, generous foraminotomy of 5 was performed.  Protecting the 5 root, decompressed the lateral recess at the medial border of the pedicle. Superior articulating process of 5 was hypertrophic as well here as was the facet.  Less than 25% of the facet was removed bilaterally.  I continued the cephalad, performed a foraminotomy of L4 protecting the 4 root.  It was slightly stenotic here, disk was examined.  No herniation. Again, we detached and removed the ligamentum flavum from the interspace.  No disk herniation, neural probe passed freely out the foramen of 4 and 5.  There was 1 cm of excursion of the 5 root medial of the pedicle without tension as well as on the contralateral side.  No CSF leakage or active bleeding.  Thrombin-soaked Gelfoam was placed in the laminotomy defects bilaterally, removed the McCulloch retractor.  No active bleeding.  Copiously irrigated the wound.  We repaired the dorsolumbar fascia with 1 Vicryl, subcu with 2-0, and skin with subcuticular Prolene.  Sterile dressing applied, placed supine on the hospital bed, extubated without difficulty, and transported to the recovery room in satisfactory condition.  The patient tolerated the procedure well.  No complications.  Assistant, Lacie Draft, [place].  Minimal blood loss.     [name], M.D.     Geralynn Rile  D:  12/28/2015  T:  12/28/2015  Job:  [id number]

## 2015-12-28 NOTE — Discharge Instructions (Signed)
Walk As Tolerated utilizing back precautions.  No bending, twisting, or lifting.  No driving for 2 weeks.   °Aquacel dressing may remain in place until follow up. May shower with aquacel dressing in place. If the dressing peels off or becomes saturated, you may remove aquacel dressing and place gauze and tape dressing which should be kept clean and dry and changed daily. Do not remove steri-strips if they are present. °See Dr. Birdena Kingma in office in 10 to 14 days. Begin taking aspirin 81mg per day starting 4 days after your surgery if not allergic to aspirin or on another blood thinner. °Walk daily even outside. Use a cane or walker only if necessary. °Avoid sitting on soft sofas. ° °

## 2015-12-28 NOTE — Anesthesia Postprocedure Evaluation (Signed)
Anesthesia Post Note  Patient: [name]  Procedure(s) Performed: Procedure(s) (LRB): MICRO LUMBAR DECOMPRESSION L4 - L5 BILATERALLY (Bilateral)  Patient location during evaluation: PACU Anesthesia Type: General Level of consciousness: awake and alert Pain management: pain level controlled Vital Signs Assessment: post-procedure vital signs reviewed and stable Respiratory status: spontaneous breathing, nonlabored ventilation and respiratory function stable Cardiovascular status: blood pressure returned to baseline and stable Postop Assessment: no signs of nausea or vomiting Anesthetic complications: no    Last Vitals:  Filed Vitals:   12/28/15 1100 12/28/15 1115  BP: 144/78 127/83  Pulse: 64 61  Temp:    Resp: 12 12    Last Pain:  Filed Vitals:   12/28/15 1118  PainSc: Asleep                 Bracken Moffa A

## 2015-12-28 NOTE — Anesthesia Procedure Notes (Signed)
Procedure Name: Intubation Date/Time: 12/28/2015 7:34 AM Performed by: Anne Fu Pre-anesthesia Checklist: Patient identified, Emergency Drugs available, Suction available, Patient being monitored and Timeout performed Patient Re-evaluated:Patient Re-evaluated prior to inductionOxygen Delivery Method: Circle system utilized Preoxygenation: Pre-oxygenation with 100% oxygen Intubation Type: IV induction Ventilation: Mask ventilation without difficulty Laryngoscope Size: Mac and 4 Grade View: Grade II Tube type: Oral Tube size: 7.5 mm Number of attempts: 1 Airway Equipment and Method: Stylet Placement Confirmation: ETT inserted through vocal cords under direct vision,  positive ETCO2,  CO2 detector and breath sounds checked- equal and bilateral Secured at: 21 cm Tube secured with: Tape Dental Injury: Teeth and Oropharynx as per pre-operative assessment

## 2015-12-28 NOTE — Anesthesia Preprocedure Evaluation (Addendum)
Anesthesia Evaluation  Patient identified by MRN, date of birth, ID band Patient awake    Reviewed: Allergy & Precautions, NPO status , Patient's Chart, lab work & pertinent test results  History of Anesthesia Complications (+) PONV  Airway Mallampati: I  TM Distance: >3 FB Neck ROM: Full    Dental  (+) Teeth Intact, Dental Advisory Given   Pulmonary  breath sounds clear to auscultation        Cardiovascular hypertension, Pt. on medications Rhythm:Regular Rate:Normal     Neuro/Psych    GI/Hepatic   Endo/Other    Renal/GU      Musculoskeletal   Abdominal   Peds  Hematology   Anesthesia Other Findings   Reproductive/Obstetrics                             Anesthesia Physical Anesthesia Plan  ASA: II  Anesthesia Plan: General   Post-op Pain Management:    Induction: Intravenous  Airway Management Planned: Oral ETT  Additional Equipment:   Intra-op Plan:   Post-operative Plan: Extubation in OR  Informed Consent: I have reviewed the patients History and Physical, chart, labs and discussed the procedure including the risks, benefits and alternatives for the proposed anesthesia with the patient or authorized representative who has indicated his/her understanding and acceptance.   Dental advisory given  Plan Discussed with: CRNA, Anesthesiologist and Surgeon  Anesthesia Plan Comments:         Anesthesia Quick Evaluation  

## 2015-12-28 NOTE — H&P (View-Only) (Signed)
[name] is an 55 y.o. female.   Chief Complaint: back and B/L leg pain HPI: The patient is a 55 year old female who presents with back pain. The patient is here today for a surgical consult and in referral from Dr. Nelva Bush. The patient reports low back symptoms which began 2 month(s) ago without any known injury. Symptoms are reported to be located in the low back and Symptoms include pain. The pain radiates to the left thigh and left lower leg. The patient feels as if the symptoms are worsening. Symptoms are exacerbated by standing and sitting. Prior to being seen today the patient was previously evaluated by her PCP and Dr. Nelva Bush. Past evaluation has included x-ray of the lumbar spine and MRI of the lumbar spine. Past treatment has included opioid analgesics (Hydrocodone), muscle relaxants (Methocarbamol), corticosteroids (Prednisone), ice packs, heating pad and cortisone injection in left hip. The patient states that this is not a Designer, jewellery case.  Past Medical History  Diagnosis Date  . Hypertension   . Back pain     related to spinal stenosis and disc problem, radiates down left buttocks to leg., weakness occ.  [name] PONV (postoperative nausea and vomiting)   . Vaginal foreign object     "Uses Femring"    Past Surgical History  Procedure Laterality Date  . Abdominal hysterectomy    . Foot surgery Bilateral     [place] "bunion,bone spur, tendon" (1) -6'16, (1)-10'16  . Cardiac catheterization N/A 04/18/2015    Procedure: Left Heart Cath and Coronary Angiography;  Surgeon: [name], MD;  Location: [facility];  Service: Cardiovascular;  Laterality: N/A;  . Wisdom tooth extraction      No family history on file. Social History:  reports that she has never smoked. She does not have any smokeless tobacco history on file. She reports that she does not drink alcohol or use illicit drugs.  Allergies:  Allergies  Allergen Reactions  . Penicillins  Anaphylaxis and Hives     (Not in a hospital admission)  No results found for this or any previous visit (from the past 48 hour(s)). No results found.  Review of Systems  Constitutional: Negative.   HENT: Negative.   Eyes: Negative.   Respiratory: Negative.   Cardiovascular: Negative.   Gastrointestinal: Negative.   Genitourinary: Negative.   Musculoskeletal: Positive for back pain.  Skin: Negative.   Neurological: Positive for sensory change and focal weakness.    There were no vitals taken for this visit. Physical Exam  Constitutional: She is oriented to person, place, and time. She appears well-developed.  HENT:  Head: Normocephalic.  Eyes: Pupils are equal, round, and reactive to light.  Neck: Normal range of motion.  Cardiovascular: Normal rate.   Respiratory: Effort normal.  GI: Soft.  Musculoskeletal:  On exam, moderate distress. Mood and affect appropriate. Walks with an antalgic gait. Straight leg raises left buttock, thigh, calf pain, exacerbated with dorsum augmentation maneuver. Mild on the right. EHL is 5-/5 in comparison.  Slightly awkward sensation in the L5 dermatome. Lumbar spine exam reveals no evidence of soft tissue swelling, deformity or skin ecchymosis. On palpation there is no tenderness of the lumbar spine. No flank pain with percussion. The abdomen is soft and nontender. Nontender over the trochanters. No cellulitis or lymphadenopathy.  Good range of motion of the lumbar spine without associated pain. Motor is 5/5 including tibialis anterior, plantar flexion, quadriceps and hamstrings. Patient is normoreflexic. There is  no Babinski or clonus. Sensory exam is intact to light touch. Patient has good distal pulses. No DVT. No pain and normal range of motion without instability of the hips, knees and ankles.  Neurological: She is alert and oriented to person, place, and time. She has normal reflexes.  Skin: Skin is warm and dry.    Three view x-rays AP,  lateral, flexion extension demonstrates disc space narrowing at 4-5, no instability in flexion extension.  MRI was reviewed, which demonstrates multifactorial spinal stenosis at L4-5, which is severe in the lateral recess. Moderate-to-severe centrally, circumferential disc protrusion, some narrowing of the neural foramen at L4 on the left. Mild facet arthropathy at 3-4.  Assessment/Plan Spinal stenosis L4-5 Left lower extremity radicular pain L5 nerve root distribution secondary to multifactorial spinal stenosis at L4-5, severe in the lateral recesses, myotomal weakness, dermatomal dysesthesias.  I had extensive discussion with Jessicia concerning the current pathology, relevant anatomy and treatment options. She does work over at [employer]. We discussed activity modification, avoid extension, favor flexion, analgesics as well as an epidural steroid injection. She is currently not interested in epidural steroid injection at this point in time as she feels that they would be temporary. We discussed other options, activity modification at work as well as a lumbar decompression. We discussed microlumbar decompression at L4-5. I had an extensive discussion of the risks and benefits of the lumbar decompression with the patient including bleeding, infection, damage to neurovascular structures, epidural fibrosis, CSF leak requiring repair. We also discussed increase in pain, adjacent segment disease, recurrent disc herniation, need for future surgery including repeat decompression and/or fusion. We also discussed risks of postoperative hematoma, paralysis, anesthetic complications including DVT, PE, death, cardiopulmonary dysfunction. In addition, the perioperative and postoperative courses were discussed in detail including the rehabilitative time and return to functional activity and work. I provided the patient with an illustrated handout and utilized the appropriate surgical models. My concern is that she  will have residual symptoms related to her disc degeneration, facet arthropathy and we specifically discussed the possibly of recurrent disc herniation, residual symptomatology affecting her ability to sit long stand, long band, etc. This started about two years ago. She had a fall in the snow on her way to work and has had persistent symptoms since, she is fairly disabled by her symptoms she is reporting, having to continually take analgesics. She is currently taking hydrocodone. We discussed the operative procedure, overnight in the hospital, out of work for approximately 6 weeks to 12 weeks. Potentially earlier depending upon her response. No history of MRSA or DVT. She is otherwise healthy, is not a smoker. Again, we specifically discussed the risks and benefits of surgery versus conservative treatment. We provided her with illustrated handout and discussed in detail. We will proceed accordingly. We discussed possible bilateral decompression depending upon her symptomatology. Again, overall in duration this has been eight weeks of an exacerbation and on and off symptoms for two years. She has been doing a home exercise program with activity modification as well. In the interim again avoid extension, favor flexion. She has again tried therapy in the past for her back and has been attempting that, but has been unable to receive any benefit due to the pain radiating into her leg. Again, we will proceed with decompression.  Plan microlumbar decompression L4-5 bilateral  Cecilie Kicks., PA-C for [name] 12/25/2015, 12:01 PM

## 2015-12-28 NOTE — Interval H&P Note (Signed)
History and Physical Interval Note:  12/28/2015 7:09 AM  [name]  has presented today for surgery, with the diagnosis of spinal stenosis L4 - L5  The various methods of treatment have been discussed with the patient and family. After consideration of risks, benefits and other options for treatment, the patient has consented to  Procedure(s): MICRO LUMBAR DECOMPRESSION L4 - L5 BILATERALLY (Bilateral) as a surgical intervention .  The patient's history has been reviewed, patient examined, no change in status, stable for surgery.  I have reviewed the patient's chart and labs.  Questions were answered to the patient's satisfaction.     Michaeljoseph Revolorio C

## 2015-12-28 NOTE — Brief Op Note (Signed)
12/28/2015  9:19 AM  PATIENT:  [name]  55 y.o. female  PRE-OPERATIVE DIAGNOSIS:  spinal stenosis L4 - L5  POST-OPERATIVE DIAGNOSIS:  spinal stenosis L4-L5  PROCEDURE:  Procedure(s): MICRO LUMBAR DECOMPRESSION L4 - L5 BILATERALLY (Bilateral)  SURGEON:  Surgeon(s) and Role:    * [name], MD - Primary  PHYSICIAN ASSISTANT:   ASSISTANTS: Bissell   ANESTHESIA:   general  EBL:  Total I/O In: -  Out: 100 [Urine:100]  BLOOD ADMINISTERED:none  DRAINS: none   LOCAL MEDICATIONS USED:  MARCAINE     SPECIMEN:  No Specimen  DISPOSITION OF SPECIMEN:  N/A  COUNTS:  YES  TOURNIQUET:  * No tourniquets in log *  DICTATION: .Other Dictation: Dictation Number [id number]  PLAN OF CARE: Admit for overnight observation  PATIENT DISPOSITION:  PACU - hemodynamically stable.   Delay start of Pharmacological VTE agent (>24hrs) due to surgical blood loss or risk of bleeding: yes

## 2015-12-29 DIAGNOSIS — M479 Spondylosis, unspecified: Secondary | ICD-10-CM | POA: Diagnosis not present

## 2015-12-29 DIAGNOSIS — M4806 Spinal stenosis, lumbar region: Secondary | ICD-10-CM | POA: Diagnosis not present

## 2015-12-29 DIAGNOSIS — M5116 Intervertebral disc disorders with radiculopathy, lumbar region: Secondary | ICD-10-CM | POA: Diagnosis not present

## 2015-12-29 DIAGNOSIS — K5900 Constipation, unspecified: Secondary | ICD-10-CM | POA: Diagnosis not present

## 2015-12-29 DIAGNOSIS — I10 Essential (primary) hypertension: Secondary | ICD-10-CM | POA: Diagnosis not present

## 2015-12-29 LAB — BASIC METABOLIC PANEL
ANION GAP: 6 (ref 5–15)
BUN: 11 mg/dL (ref 6–20)
CALCIUM: 8.8 mg/dL — AB (ref 8.9–10.3)
CHLORIDE: 101 mmol/L (ref 101–111)
CO2: 29 mmol/L (ref 22–32)
Creatinine, Ser: 0.84 mg/dL (ref 0.44–1.00)
GFR calc non Af Amer: >60 mL/min (ref >60)
Glucose, Bld: 112 mg/dL — ABNORMAL HIGH (ref 65–99)
Potassium: 4 mmol/L (ref 3.5–5.1)
Sodium: 136 mmol/L (ref 135–145)

## 2015-12-29 MED ORDER — ONDANSETRON HCL 8 MG PO TABS: 8.0000 mg | ORAL_TABLET | Freq: Three times a day (TID) | ORAL | Status: DC | PRN

## 2015-12-29 NOTE — Progress Notes (Signed)
Subjective: 1 Day Post-Op Procedure(s) (LRB): MICRO LUMBAR DECOMPRESSION L4 - L5 BILATERALLY (Bilateral) Patient reports pain as 4 on 0-10 scale.    Objective: Vital signs in last 24 hours: Temp:  [97.7 F (36.5 C)-98.7 F (37.1 C)] 98.7 F (37.1 C) (03/04 0600) Pulse Rate:  [60-94] 66 (03/04 0600) Resp:  [8-26] 16 (03/04 0600) BP: (122-178)/(61-116) 122/74 mmHg (03/04 0600) SpO2:  [99 %-100 %] 100 % (03/04 0600)  Intake/Output from previous day: 03/03 0701 - 03/04 0700 In: 2355 [P.O.:240; I.V.:2115] Out: 750 [Urine:700; Blood:50] Intake/Output this shift: Total I/O In: 240 [P.O.:240] Out: -   No results for input(s): HGB in the last 72 hours. No results for input(s): WBC, RBC, HCT, PLT in the last 72 hours.  Recent Labs  12/29/15 0514  NA 136  K 4.0  CL 101  CO2 29  BUN 11  CREATININE 0.84  GLUCOSE 112*  CALCIUM 8.8*   No results for input(s): LABPT, INR in the last 72 hours.  Neurologically intact Sensation intact distally Intact pulses distally Dorsiflexion/Plantar flexion intact  DSG C/D/I  Assessment/Plan: 1 Day Post-Op Procedure(s) (LRB): MICRO LUMBAR DECOMPRESSION L4 - L5 BILATERALLY (Bilateral) Advance diet Up with therapy Plan for discharge tomorrow  Decrease pain meds due to nausea. Discussed OR and future back issues and need to minimize pain meds.  [name] C 12/29/2015, 8:41 AM

## 2015-12-29 NOTE — Evaluation (Signed)
Occupational Therapy Evaluation Patient Details Name: [name] MRN: [id number] DOB: 1961-05-14 Today's Date: 12/29/2015    History of Present Illness Bilateral micro-lumbar decompression L4-5   Clinical Impression   This 55 year old female was admitted for the above sx. At baseline, she managed ADLs with difficulty and made adaptations as needed (ie. Not tying shoes).  She currently needs up to max A for LB adls.   Evaluation was limited by pain. Will follow in acute setting with min A to min guard level goals    Follow Up Recommendations  Supervision/Assistance - 24 hour    Equipment Recommendations  None recommended by OT    Recommendations for Other Services       Precautions / Restrictions Precautions Precautions: Back Restrictions Weight Bearing Restrictions: No      Mobility Bed Mobility Overal bed mobility: Needs Assistance Bed Mobility: Supine to Sit;Rolling Rolling: Min assist      Sit to sidelying: Mod assist General bed mobility comments: assist for bil LEs back to bed; cues for back precautions  Transfers Overall transfer level: Needs assistance Equipment used: Rolling walker (2 wheeled) Transfers: Sit to/from Stand Sit to Stand: Min assist         General transfer comment: assist to rise and stabilize; cues for back precautions    Balance                                            ADL Overall ADL's : Needs assistance/impaired                         Toilet Transfer: Minimal assistance;Stand-pivot (to bed, cues for back precautions)             General ADL Comments: evaluation limited by pain this pm.  Pt wanted to get back to bed.  Reviewed back precautions and ADL applications.  Pt can use AE from when husband had hip sx.  Educated on toilet aide and where to get this, if needed.  She will have assist from husband, sister and mother.  Based on clinical judgment, pt needs supervision for UB adls and  max A for LB adls.       Vision     Perception     Praxis      Pertinent Vitals/Pain Pain Assessment: 0-10 Pain Score: 9  Pain Location: back Pain Descriptors / Indicators: Aching Pain Intervention(s): Limited activity within patient's tolerance;Monitored during session;Premedicated before session;Repositioned;Ice applied     Hand Dominance     Extremity/Trunk Assessment Upper Extremity Assessment Upper Extremity Assessment: Overall WFL for tasks assessed      Cervical / Trunk Assessment Cervical / Trunk Assessment: Normal   Communication Communication Communication: No difficulties   Cognition Arousal/Alertness: Awake/alert Behavior During Therapy: WFL for tasks assessed/performed Overall Cognitive Status: Within Functional Limits for tasks assessed                     General Comments       Exercises       Shoulder Instructions      Home Living Family/patient expects to be discharged to:: Private residence Living Arrangements: Spouse/significant other Available Help at Discharge: Family Type of Home: House Home Access: Stairs to enter Technical brewer of Steps: 4 Entrance Stairs-Rails: Left Home Layout: Two level;Able to live on main  level with bedroom/bathroom     Bathroom Shower/Tub: Occupational psychologist: Standard     Home Equipment: Bedside commode (husband has reacher/sock aide/long shoehorn from previous sx)          Prior Functioning/Environment Level of Independence: Independent             OT Diagnosis: Acute pain   OT Problem List: Decreased strength;Decreased activity tolerance;Decreased knowledge of use of DME or AE;Decreased knowledge of precautions;Pain   OT Treatment/Interventions: Self-care/ADL training;DME and/or AE instruction;Patient/family education    OT Goals(Current goals can be found in the care plan section) Acute Rehab OT Goals Patient Stated Goal: Regain IND without pain OT Goal  Formulation: With patient Time For Goal Achievement: 01/05/16 Potential to Achieve Goals: Good ADL Goals Pt Will Perform Lower Body Bathing: with min assist;with adaptive equipment;sit to/from stand Pt Will Perform Lower Body Dressing: with min assist;with adaptive equipment;sit to/from stand Pt Will Transfer to Toilet: with min guard assist;ambulating;bedside commode Pt Will Perform Toileting - Clothing Manipulation and hygiene: with min assist;sit to/from stand Pt Will Perform Tub/Shower Transfer: Shower transfer;with min guard assist;ambulating;3 in 1 Additional ADL Goal #1: pt will perform ADL/bathroom transfers with no more than 1 vc per activity for back precautions  OT Frequency: Min 2X/week   Barriers to D/C:            Co-evaluation              End of Session    Activity Tolerance: Patient limited by pain Patient left: in bed;with call bell/phone within reach;with family/visitor present   Time: [id number] OT Time Calculation (min): 17 min Charges:  OT General Charges $OT Visit: 1 Procedure OT Evaluation $OT Eval Low Complexity: 1 Procedure G-Codes: OT G-codes **NOT FOR INPATIENT CLASS** Functional Assessment Tool Used: clinical judgment Functional Limitation: Self care Self Care Current Status [id number]): At least 60 percent but less than 80 percent impaired, limited or restricted Self Care Goal Status [id number]): At least 20 percent but less than 40 percent impaired, limited or restricted  [name] 12/29/2015, 1:46 PM Lesle Chris, OTR/L [phone] 12/29/2015

## 2015-12-29 NOTE — Evaluation (Signed)
Physical Therapy Evaluation Patient Details Name: [name] MRN: [id number] DOB: 10/25/1961 Today's Date: 12/29/2015   History of Present Illness  Bilateral micro-lumbar decompression L4-5  Clinical Impression  Pt s/p back surgery presents with functional mobility limitations 2* post op back pain and back precautions.  Pt should progress to dc home with family assist.    Follow Up Recommendations No PT follow up    Equipment Recommendations  Rolling walker with 5" wheels    Recommendations for Other Services OT consult     Precautions / Restrictions Precautions Precautions: Back Restrictions Weight Bearing Restrictions: No      Mobility  Bed Mobility Overal bed mobility: Needs Assistance Bed Mobility: Supine to Sit;Rolling Rolling: Min assist   Supine to sit: Min assist     General bed mobility comments: cues for correct log roll technique  Transfers Overall transfer level: Needs assistance Equipment used: Rolling walker (2 wheeled) Transfers: Sit to/from Stand Sit to Stand: Min assist         General transfer comment: cues for transition position, use of UEs to self assist and adherence to back precautions  Ambulation/Gait Ambulation/Gait assistance: Min assist Ambulation Distance (Feet): 150 Feet Assistive device: Rolling walker (2 wheeled) Gait Pattern/deviations: Step-through pattern;Decreased step length - right;Decreased step length - left;Shuffle;Trunk flexed Gait velocity: decr Gait velocity interpretation: Below normal speed for age/gender General Gait Details: cues for sequence and position from [employer]            Wheelchair Mobility    Modified Rankin (Stroke Patients Only)       Balance                                             Pertinent Vitals/Pain Pain Assessment: 0-10 Pain Score: 8  Pain Location: back Pain Descriptors / Indicators: Aching;Sore Pain Intervention(s): Limited activity within  patient's tolerance;Monitored during session;Premedicated before session    [place] expects to be discharged to:: Private residence Living Arrangements: Spouse/significant other Available Help at Discharge: Family Type of Home: House Home Access: Stairs to enter Entrance Stairs-Rails: Left Entrance Stairs-Number of Steps: 4 Home Layout: Two level;Able to live on main level with bedroom/bathroom Home Equipment: None      Prior Function Level of Independence: Independent               Hand Dominance        Extremity/Trunk Assessment   Upper Extremity Assessment: Overall WFL for tasks assessed           Lower Extremity Assessment: Overall WFL for tasks assessed      Cervical / Trunk Assessment: Normal  Communication   Communication: No difficulties  Cognition Arousal/Alertness: Awake/alert Behavior During Therapy: WFL for tasks assessed/performed Overall Cognitive Status: Within Functional Limits for tasks assessed                      General Comments      Exercises        Assessment/Plan    PT Assessment Patient needs continued PT services  PT Diagnosis Difficulty walking   PT Problem List Decreased strength;Decreased range of motion;Decreased activity tolerance;Decreased mobility;Decreased knowledge of use of DME;Pain;Decreased knowledge of precautions  PT Treatment Interventions DME instruction;Gait training;Stair training;Functional mobility training;Therapeutic activities;Therapeutic exercise;Patient/family education   PT Goals (Current goals can be found in the Care  Plan section) Acute Rehab PT Goals Patient Stated Goal: Regain IND without pain PT Goal Formulation: With patient Time For Goal Achievement: 01/01/16 Potential to Achieve Goals: Good    Frequency 7X/week   Barriers to discharge        Co-evaluation               End of Session   Activity Tolerance: Patient limited by pain Patient left: in  chair;with call bell/phone within reach;with family/visitor present Nurse Communication: Mobility status         Time: 1202-1228 PT Time Calculation (min) (ACUTE ONLY): 26 min   Charges:   PT Evaluation $PT Eval Low Complexity: 1 Procedure PT Treatments $Gait Training: 8-22 mins   PT G Codes:        [name] 01-12-16, 1:26 PM

## 2015-12-29 NOTE — Care Management Note (Addendum)
Case Management Note  Patient Details  Name: [name] MRN: [id number] Date of Birth: May 28, 1961  Subjective/Objective:                  MICRO LUMBAR DECOMPRESSION L4 - L5 BILATERALLY  Action/Plan: CM spoke with patient at the bedside. Patient reports she has standard 2 walkers at home. She lives with her husband who is able to assist her. Awaiting Physical Therapy evaluation for DME needs. Patient states she prefers to have PT at home if PT is recommended. CM will continue to follow for discharge needs.   Expected Discharge Date:  12/30/15               Expected Discharge Plan:  Home with self care  In-House Referral:      Discharge planning Services  CM Consult  Post Acute Care Choice:    Choice offered to:     DME Arranged:    DME Agency:     HH Arranged:    [place] Agency:     Status of Service:  In process, will continue to follow  Medicare Important Message Given:    Date Medicare IM Given:    Medicare IM give by:    Date Additional Medicare IM Given:    Additional Medicare Important Message give by:     If discussed at [place] of Stay Meetings, dates discussed:    Additional Comments:  Apolonio Schneiders, RN 12/29/2015, 10:42 AM

## 2015-12-30 DIAGNOSIS — M5116 Intervertebral disc disorders with radiculopathy, lumbar region: Secondary | ICD-10-CM | POA: Diagnosis not present

## 2015-12-30 DIAGNOSIS — K5900 Constipation, unspecified: Secondary | ICD-10-CM | POA: Diagnosis not present

## 2015-12-30 DIAGNOSIS — M479 Spondylosis, unspecified: Secondary | ICD-10-CM | POA: Diagnosis not present

## 2015-12-30 DIAGNOSIS — I10 Essential (primary) hypertension: Secondary | ICD-10-CM | POA: Diagnosis not present

## 2015-12-30 DIAGNOSIS — M4806 Spinal stenosis, lumbar region: Secondary | ICD-10-CM | POA: Diagnosis not present

## 2015-12-30 MED ORDER — DIPHENHYDRAMINE HCL 25 MG PO CAPS
25.0000 mg | ORAL_CAPSULE | Freq: Once | ORAL | Status: AC
  Administered 2015-12-30: 25 mg via ORAL

## 2015-12-30 MED ORDER — POLYETHYLENE GLYCOL 3350 17 G PO PACK
17.0000 g | PACK | Freq: Every day | ORAL | Status: DC
  Administered 2015-12-30 – 2015-12-31 (×2): 17 g via ORAL

## 2015-12-30 NOTE — Progress Notes (Signed)
Physical Therapy Treatment Patient Details Name: [name] MRN: [id number] DOB: [birth year] Today's Date: 12/30/2015    History of Present Illness Bilateral micro-lumbar decompression L4-5    PT Comments    Improved in mobility. Practiced steps today.  Follow Up Recommendations  No PT follow up     Equipment Recommendations  Rolling walker with 5" wheels    Recommendations for Other Services       Precautions / Restrictions Precautions Precautions: Back    Mobility  Bed Mobility   Bed Mobility: Rolling Rolling: Supervision Sidelying to sit: Supervision     Sit to sidelying: Supervision General bed mobility comments: cues for  use of RW as a rail  Transfers Overall transfer level: Needs assistance Equipment used: Rolling walker (2 wheeled) Transfers: Sit to/from Stand Sit to Stand: Supervision         General transfer comment: assist to rise and stabilize; cues for back precautions  Ambulation/Gait Ambulation/Gait assistance: Min guard Ambulation Distance (Feet): 90 Feet Assistive device: Rolling walker (2 wheeled) Gait Pattern/deviations: Step-to pattern;Step-through pattern Gait velocity: 147/72   General Gait Details: c/o dizziness. 147   Stairs Stairs: Yes Stairs assistance: Min assist Stair Management: One rail Left;Step to pattern;Forwards Number of Stairs: 2 General stair comments: 1 HHA   Wheelchair Mobility    Modified Rankin (Stroke Patients Only)       Balance                                    Cognition Arousal/Alertness: Awake/alert                          Exercises      General Comments        Pertinent Vitals/Pain Pain Score: 3  Pain Location: 3 back Pain Descriptors / Indicators: Tightness Pain Intervention(s): Repositioned;Ice applied    Home Living                      Prior Function            PT Goals (current goals can now be found in the care plan  section) Progress towards PT goals: Progressing toward goals    Frequency  7X/week    PT Plan Current plan remains appropriate    Co-evaluation             End of Session   Activity Tolerance: Patient tolerated treatment well Patient left: in bed;with call bell/phone within reach     Time: 1405-1415 PT Time Calculation (min) (ACUTE ONLY): 10 min  Charges:  $Gait Training: 8-22 mins $Self Care/Home Management: 8-22                    G Codes:      Claretha Cooper 12/30/2015, 3:21 PM [place] PT [phone]

## 2015-12-30 NOTE — Progress Notes (Signed)
Physical Therapy Treatment Patient Details Name: [name] MRN: [id number] DOB: Sep 02, 1961 Today's Date: 12/30/2015    History of Present Illness Bilateral micro-lumbar decompression L4-5    PT Comments    The patient c/o  Dizziness upon ambulating in the hall. Improved after sitting, will see for steps later.  Follow Up Recommendations  No PT follow up     Equipment Recommendations  Rolling walker with 5" wheels    Recommendations for Other Services       Precautions / Restrictions Precautions Precautions: Back    Mobility  Bed Mobility   Bed Mobility: Rolling;Sidelying to Sit Rolling: Supervision Sidelying to sit: Supervision       General bed mobility comments: cues for  use of RW as a rail  Transfers Overall transfer level: Needs assistance Equipment used: Rolling walker (2 wheeled) Transfers: Sit to/from Stand Sit to Stand: Min guard         General transfer comment: assist to rise and stabilize; cues for back precautions  Ambulation/Gait Ambulation/Gait assistance: Min assist Ambulation Distance (Feet): 90 Feet   Gait Pattern/deviations: Step-to pattern;Step-through pattern Gait velocity: 147/72   General Gait Details: c/o dizziness. 147   Stairs            Wheelchair Mobility    Modified Rankin (Stroke Patients Only)       Balance                                    Cognition Arousal/Alertness: Awake/alert                          Exercises      General Comments        Pertinent Vitals/Pain Pain Score: 4  Pain Location: back Pain Descriptors / Indicators: Tightness Pain Intervention(s): Premedicated before session;Repositioned;Ice applied    Home Living                      Prior Function            PT Goals (current goals can now be found in the care plan section) Progress towards PT goals: Progressing toward goals    Frequency  7X/week    PT Plan Current plan  remains appropriate    Co-evaluation             End of Session   Activity Tolerance: Treatment limited secondary to medical complications (Comment) Patient left: in chair;with call bell/phone within reach     Time: 1101-1130 PT Time Calculation (min) (ACUTE ONLY): 29 min  Charges:  $Gait Training: 8-22 mins $Self Care/Home Management: 8-22                    G Codes:      Claretha Cooper 12/30/2015, 3:17 PM Tresa Endo PT [phone]

## 2015-12-30 NOTE — Progress Notes (Signed)
Occupational Therapy Treatment Patient Details Name: [name] MRN: [id number] DOB: [birth year] Today's Date: 12/30/2015    History of present illness Bilateral micro-lumbar decompression L4-5   OT comments  All education is complete and patient indicates understanding. Pt has AE at home and can now return demonstrate. Pt practiced all basic transfers. Ot to sign off acutely.    Follow Up Recommendations  Supervision - Intermittent    Equipment Recommendations  None recommended by OT    Recommendations for Other Services      Precautions / Restrictions Precautions Precautions: Back       Mobility Bed Mobility Overal bed mobility: Independent Bed Mobility: Rolling Rolling: Supervision Sidelying to sit: Supervision     Sit to sidelying: Supervision General bed mobility comments: cues for  use of RW as a rail  Transfers Overall transfer level: Independent Equipment used: Rolling walker (2 wheeled) Transfers: Sit to/from Stand Sit to Stand: Supervision         General transfer comment: assist to rise and stabilize; cues for back precautions    Balance                                   ADL Overall ADL's : Needs assistance/impaired                     Lower Body Dressing: Modified independent;Sit to/from stand;Adhering to back precautions;With adaptive equipment Lower Body Dressing Details (indicate cue type and reason): able to don socks doff pants don pants with AE - all Education complete Toilet Transfer: Modified Independent       Tub/ Shower Transfer: Walk-in shower;Modified independent;Adhering to back precautions;3 in 1;Rolling walker Tub/Shower Transfer Details (indicate cue type and reason): advised to use warm water on initial shower and to plan to rest after first shower due to fatigue Functional mobility during ADLs: Modified independent General ADL Comments: spouse present for education. Pt educated on bed, shower,  toilet and LB this session      Vision                     Perception     Praxis      Cognition   Behavior During Therapy: WFL for tasks assessed/performed Overall Cognitive Status: Within Functional Limits for tasks assessed                       Extremity/Trunk Assessment               Exercises     Shoulder Instructions       General Comments      Pertinent Vitals/ Pain       Pain Score: 3  Pain Location: back Pain Descriptors / Indicators: Tightness Pain Intervention(s): Monitored during session;Premedicated before session;Repositioned  Home Living                                          Prior Functioning/Environment              Frequency Min 2X/week     Progress Toward Goals  OT Goals(current goals can now be found in the care plan section)  Progress towards OT goals: Goals met/education completed, patient discharged from [place] Discharge plan remains appropriate  Co-evaluation                 End of Session Equipment Utilized During Treatment: Gait belt;Rolling walker   Activity Tolerance Patient tolerated treatment well   Patient Left in chair;with call bell/phone within reach;with family/visitor present   Nurse Communication Mobility status;Precautions    Functional Assessment Tool Used: clinical judgement Functional Limitation: Self care Self Care Current Status (M6286): At least 1 percent but less than 20 percent impaired, limited or restricted Self Care Goal Status (N8177): 0 percent impaired, limited or restricted Self Care Discharge Status [phone]): 0 percent impaired, limited or restricted   Time: 9038-3338 OT Time Calculation (min): 17 min  Charges: OT G-codes **NOT FOR INPATIENT CLASS** Functional Assessment Tool Used: clinical judgement Functional Limitation: Self care Self Care Current Status (V2919): At least 1 percent but less than 20 percent impaired, limited or  restricted Self Care Goal Status (T6606): 0 percent impaired, limited or restricted Self Care Discharge Status [phone]): 0 percent impaired, limited or restricted OT General Charges $OT Visit: 1 Procedure OT Treatments $Self Care/Home Management : 8-22 mins  Parke Poisson B 12/30/2015, 4:01 PM   Jeri Modena   OTR/L Pager: [phone] Office: [phone] .

## 2015-12-30 NOTE — Progress Notes (Signed)
Subjective: 2 Days Post-Op Procedure(s) (LRB): MICRO LUMBAR DECOMPRESSION L4 - L5 BILATERALLY (Bilateral) Patient reports pain as mild to back.  No BM but able to void. Tolerating PO's. Limited with PT. Denies sOB, CP, no f/c.  Objective: Vital signs in last 24 hours: Temp:  [97.2 F (36.2 C)-98.3 F (36.8 C)] 97.2 F (36.2 C) (03/05 0555) Pulse Rate:  [63-72] 63 (03/05 0555) Resp:  [15-16] 16 (03/05 0555) BP: (103-135)/(56-64) 103/61 mmHg (03/05 0555) SpO2:  [99 %-100 %] 100 % (03/05 0555)  Intake/Output from previous day: 03/04 0701 - 03/05 0700 In: 1370 [P.O.:1370] Out: -  Intake/Output this shift: Total I/O In: 240 [P.O.:240] Out: -   No results for input(s): HGB in the last 72 hours. No results for input(s): WBC, RBC, HCT, PLT in the last 72 hours.  Recent Labs  12/29/15 0514  NA 136  K 4.0  CL 101  CO2 29  BUN 11  CREATININE 0.84  GLUCOSE 112*  CALCIUM 8.8*   No results for input(s): LABPT, INR in the last 72 hours.  Alert and oriented x3. RRR, Lungs clear, BS x4. Left Calf soft and non tender. back dressing C/D/I. No DVT signs. No signs of infection or compartment syndrome. Bilateral LE grossly neurovascularly intact.   Assessment/Plan: 2 Days Post-Op Procedure(s) (LRB): MICRO LUMBAR DECOMPRESSION L4 - L5 BILATERALLY (Bilateral) Up with PT Plan D/c tomorrow Continue current care Reports itching add benadryl Miralax today for constipation    Danzell Birky L 12/30/2015, 11:23 AM

## 2015-12-31 DIAGNOSIS — M4806 Spinal stenosis, lumbar region: Secondary | ICD-10-CM | POA: Diagnosis not present

## 2015-12-31 DIAGNOSIS — M479 Spondylosis, unspecified: Secondary | ICD-10-CM | POA: Diagnosis not present

## 2015-12-31 DIAGNOSIS — K5900 Constipation, unspecified: Secondary | ICD-10-CM | POA: Diagnosis not present

## 2015-12-31 DIAGNOSIS — I10 Essential (primary) hypertension: Secondary | ICD-10-CM | POA: Diagnosis not present

## 2015-12-31 DIAGNOSIS — M5116 Intervertebral disc disorders with radiculopathy, lumbar region: Secondary | ICD-10-CM | POA: Diagnosis not present

## 2015-12-31 MED ORDER — MAGNESIUM CITRATE PO SOLN
1.0000 | Freq: Once | ORAL | Status: AC
  Administered 2015-12-31: 1 via ORAL

## 2015-12-31 NOTE — Discharge Summary (Signed)
Physician Discharge Summary   Patient ID: [name] MRN: [id number] DOB/AGE: Jun 17, 1961 55 y.o.  Admit date: 12/28/2015 Discharge date: 12/31/2015  Primary Diagnosis:   spinal stenosis L4 - L5  Admission Diagnoses:  Past Medical History  Diagnosis Date  . Hypertension   . Back pain     related to spinal stenosis and disc problem, radiates down left buttocks to leg., weakness occ.  [name] PONV (postoperative nausea and vomiting)   . Vaginal foreign object     "Uses Femring"   Discharge Diagnoses:   Active Problems:   Spinal stenosis of lumbar region  Procedure:  Procedure(s) (LRB): MICRO LUMBAR DECOMPRESSION L4 - L5 BILATERALLY (Bilateral)   Consults: None  HPI:  see H&P    Laboratory Data: Hospital Outpatient Visit on 12/19/2015  Component Date Value Ref Range Status  . MRSA, PCR 12/19/2015 NEGATIVE  NEGATIVE Final  . Staphylococcus aureus 12/19/2015 NEGATIVE  NEGATIVE Final   Comment:        The Xpert SA Assay (FDA approved for NASAL specimens in patients over 34 years of age), is one component of a comprehensive surveillance program.  Test performance has been validated by [facility] for patients greater than or equal to 59 year old. It is not intended to diagnose infection nor to guide or monitor treatment.    No results for input(s): HGB in the last 72 hours. No results for input(s): WBC, RBC, HCT, PLT in the last 72 hours.  Recent Labs  12/29/15 0514  NA 136  K 4.0  CL 101  CO2 29  BUN 11  CREATININE 0.84  GLUCOSE 112*  CALCIUM 8.8*   No results for input(s): LABPT, INR in the last 72 hours.  X-Rays:Dg Lumbar Spine 2-3 Views  12/28/2015  CLINICAL DATA:  Preoperative lumbar spine radiographs for laminectomy. Mid lower back pain, radiating to the legs. Initial encounter. EXAM: LUMBAR SPINE - 2-3 VIEW COMPARISON:  MRI of the lumbar spine performed 11/30/2015 FINDINGS: There is no evidence of fracture or subluxation. Vertebral bodies  demonstrate normal height and alignment. Intervertebral disc spaces are preserved. The visualized neural foramina are grossly unremarkable in appearance. Mild anterior osteophyte formation is noted along the upper lumbar spine. The visualized bowel gas pattern is unremarkable in appearance; air and stool are noted within the colon. The sacroiliac joints are within normal limits. IMPRESSION: No evidence of fracture or subluxation along the lumbar spine. Electronically Signed   By: Garald Balding M.D.   On: 12/28/2015 07:08   Dg Spine Portable 1 View  12/28/2015  CLINICAL DATA:  Intraoperative localization at L4-5 EXAM: PORTABLE SPINE - 1 VIEW COMPARISON:  Film from earlier in the same day FINDINGS: Surgical retractors again noted posterior to L5. Surgical instruments are now noted extending into the spinal canal posterior to the L4 and L5 vertebral bodies. IMPRESSION: Intraoperative localization at L4-5 Electronically Signed   By: Inez Catalina M.D.   On: 12/28/2015 08:58   Dg Spine Portable 1 View  12/28/2015  CLINICAL DATA:  Intraoperative localization at L4-5 EXAM: PORTABLE SPINE - 1 VIEW COMPARISON:  Film from earlier in the same day. FINDINGS: There is now surgical retractor noted at the level of the L5 vertebral body. Surgical instrument is extending towards the canal at the level of the L4-5 facet joint. IMPRESSION: Intraoperative localization at L4-5. Electronically Signed   By: Inez Catalina M.D.   On: 12/28/2015 08:19   Dg Spine Portable 1 View  12/28/2015  CLINICAL DATA:  Decompressive laminectomy EXAM: PORTABLE SPINE - 1 VIEW COMPARISON:  December 28, 2015 lumbar spine radiographs FINDINGS: Cross-table lateral lumbar image labeled #1 submitted. Metallic probe tips are posterior to the L4 and L5 spinous processes respectively. The more superiorly placed probe is posterior to the level of the L4-5 interspace specifically. No fracture or spondylolisthesis. There are scattered foci of atherosclerotic  calcification in the aorta. IMPRESSION: Metallic probe tips are posterior to the L4 and L5 spinous processes. No fracture or spondylolisthesis. Electronically Signed   By: Lowella Grip III M.D.   On: 12/28/2015 08:03    EKG: Orders placed or performed during the hospital encounter of 04/16/15  . EKG 12-Lead  . EKG 12-Lead  . ED EKG (<41mins upon arrival to the ED)  . ED EKG (<45mins upon arrival to the ED)  . EKG 12-Lead  . EKG 12-Lead  . EKG 12-Lead  . EKG 12-Lead  . EKG  . EKG 12-Lead  . EKG 12-Lead  . EKG 12-Lead     Hospital Course: Patient was admitted to [facility] and taken to the OR and underwent the above state procedure without complications.  Patient tolerated the procedure well and was later transferred to the recovery room and then to the orthopaedic floor for postoperative care.  They were given PO and IV analgesics for pain control following their surgery.  They were given 24 hours of postoperative antibiotics.   PT was consulted postop to assist with mobility and transfers.  The patient was allowed to be WBAT with therapy and was taught back precautions. Discharge planning was consulted to help with postop disposition and equipment needs.  Patient had a good night on the evening of surgery and started to get up OOB with therapy on day one. Patient was seen in rounds and was ready to go home on day one.  They were given discharge instructions and dressing directions.  They were instructed on when to follow up in the office with [name].   Diet: Regular diet Activity:WBAT; Lspine precautions Follow-up:in 10-14 days Disposition - Home Discharged Condition: good   Discharge Instructions    Call MD / Call 911    Complete by:  As directed   If you experience chest pain or shortness of breath, CALL 911 and be transported to the hospital emergency room.  If you develope a fever above 101 F, pus (white drainage) or increased drainage or redness at the wound, or  calf pain, call your surgeon's office.     Constipation Prevention    Complete by:  As directed   Drink plenty of fluids.  Prune juice may be helpful.  You may use a stool softener, such as Colace (over the counter) 100 mg twice a day.  Use MiraLax (over the counter) for constipation as needed.     Diet - low sodium heart healthy    Complete by:  As directed      Increase activity slowly as tolerated    Complete by:  As directed             Medication List    STOP taking these medications        oxyCODONE-acetaminophen 5-325 MG tablet  Commonly known as:  ROXICET  Replaced by:  oxyCODONE-acetaminophen 7.5-325 MG tablet      TAKE these medications        aspirin 81 MG EC tablet  Take 1 tablet (81 mg total) by mouth daily. Resume 4 days post-op  atorvastatin 20 MG tablet  Commonly known as:  LIPITOR  Take 20 mg by mouth daily at 6 PM.     docusate sodium 100 MG capsule  Commonly known as:  COLACE  Take 1 capsule (100 mg total) by mouth 2 (two) times daily as needed for mild constipation.     FEMRING 0.05 MG/24HR Ring  Generic drug:  Estradiol Acetate  Place 1 each vaginally every 3 (three) months.     methocarbamol 500 MG tablet  Commonly known as:  ROBAXIN  Take 1 tablet (500 mg total) by mouth every 8 (eight) hours as needed for muscle spasms.     metoprolol succinate 50 MG 24 hr tablet  Commonly known as:  TOPROL XL  Take 1 tablet (50 mg total) by mouth daily. Take with or immediately following a meal.     nitroGLYCERIN 0.4 MG SL tablet  Commonly known as:  NITROSTAT  Place 1 tablet (0.4 mg total) under the tongue every 5 (five) minutes x 3 doses as needed for chest pain.     ondansetron 8 MG tablet  Commonly known as:  ZOFRAN  Take 1 tablet (8 mg total) by mouth every 8 (eight) hours as needed for nausea or vomiting.     oxyCODONE-acetaminophen 7.5-325 MG tablet  Commonly known as:  PERCOCET  Take 1 tablet by mouth every 4 (four) hours as needed.      VITAMIN D PO  Take 1 tablet by mouth daily.           Follow-up Information    Follow up with BEANE,JEFFREY C, MD In 2 weeks.   Specialty:  Orthopedic Surgery   Contact information:   [address] [place] [address] [id number]       Signed: Lacie Draft, PA-C Orthopaedic Surgery 12/31/2015, 3:32 PM

## 2015-12-31 NOTE — Progress Notes (Signed)
Subjective: 3 Days Post-Op Procedure(s) (LRB): MICRO LUMBAR DECOMPRESSION L4 - L5 BILATERALLY (Bilateral) Patient reports pain as moderate.  Noting itching at incision. No fever, chills, N/V, numbness. Noting some weakness still in the legs, left worse than right, when up and walking. Voiding without difficulty but no BM yet. Was passing gas yesterday, noting some increased abdominal discomfort this AM. Would like to go home today.   Objective: Vital signs in last 24 hours: Temp:  [97.7 F (36.5 C)-98.6 F (37 C)] 97.8 F (36.6 C) (03/06 0659) Pulse Rate:  [77-86] 77 (03/06 0659) Resp:  [16] 16 (03/06 0659) BP: (120-127)/(63-71) 127/70 mmHg (03/06 0659) SpO2:  [98 %-100 %] 100 % (03/06 0659)  Intake/Output from previous day: 03/05 0701 - 03/06 0700 In: 1080 [P.O.:1080] Out: -  Intake/Output this shift:    No results for input(s): HGB in the last 72 hours. No results for input(s): WBC, RBC, HCT, PLT in the last 72 hours.  Recent Labs  12/29/15 0514  NA 136  K 4.0  CL 101  CO2 29  BUN 11  CREATININE 0.84  GLUCOSE 112*  CALCIUM 8.8*   No results for input(s): LABPT, INR in the last 72 hours.  Neurologically intact ABD soft Neurovascular intact Sensation intact distally Intact pulses distally Dorsiflexion/Plantar flexion intact Incision: dressing C/D/I and no drainage No cellulitis present Compartment soft no calf pain or sign of DVT  Assessment/Plan: 3 Days Post-Op Procedure(s) (LRB): MICRO LUMBAR DECOMPRESSION L4 - L5 BILATERALLY (Bilateral) Advance diet Up with therapy D/C IV fluids  Mag citrate for constipation  Warm kpad to abdomen Plan possible D/C later today as long as abdominal pain improved, pain well controlled Will discuss with Dr. Mliss Fritz, [place] Rolls. 12/31/2015, 7:51 AM

## 2015-12-31 NOTE — Progress Notes (Signed)
[facility]  Delivered rw to hospital room   Linward Headland 12/31/2015, 11:34 AM

## 2015-12-31 NOTE — Progress Notes (Signed)
Physical Therapy Treatment Patient Details Name: [name] MRN: [id number] DOB: [birth year] Today's Date: 12/31/2015    History of Present Illness Bilateral micro-lumbar decompression L4-5    PT Comments    Patient reports issues w/ constipation. Mobilizing well. Encouraged to ambulate.  Follow Up Recommendations  No PT follow up     Equipment Recommendations       Recommendations for Other Services       Precautions / Restrictions Precautions Precautions: Back    Mobility  Bed Mobility             Sit to sidelying: Supervision General bed mobility comments: cues for back precautions  Transfers   Equipment used: Rolling walker (2 wheeled)   Sit to Stand: Modified independent (Device/Increase time)            Ambulation/Gait Ambulation/Gait assistance: Supervision Ambulation Distance (Feet): 400 Feet Assistive device: Rolling walker (2 wheeled) Gait Pattern/deviations: Step-through pattern     General Gait Details: not dizzy   Stairs            Wheelchair Mobility    Modified Rankin (Stroke Patients Only)       Balance                                    Cognition Arousal/Alertness: Awake/alert                          Exercises      General Comments        Pertinent Vitals/Pain Pain Score: 3  Pain Location: back and abd Pain Descriptors / Indicators: Tightness Pain Intervention(s): Limited activity within patient's tolerance;Ice applied;Heat applied    Home Living                      Prior Function            PT Goals (current goals can now be found in the care plan section) Acute Rehab PT Goals Time For Goal Achievement: 01/05/16 Potential to Achieve Goals: Good Progress towards PT goals: Progressing toward goals (updated time due tpprolonged hospital, cpmplications/constipation)    Frequency  7X/week    PT Plan Current plan remains appropriate    Co-evaluation              End of Session   Activity Tolerance: Patient tolerated treatment well Patient left: with call bell/phone within reach;with family/visitor present     Time: [id number] PT Time Calculation (min) (ACUTE ONLY): 17 min  Charges:  $Gait Training: 8-22 mins                    G Codes:      Claretha Cooper 12/31/2015, 2:30 PM Tresa Endo PT [phone]

## 2015-12-31 NOTE — Progress Notes (Signed)
Patient continues to c/o abdominal pain/cramping radiating around to his back. No BM yet. Gave Colace, Mag Citrate and Miralax today. Patient ambulated today and I encouraged fluids. Jennette Kettle, PA will discuss with [name] today.

## 2015-12-31 NOTE — Progress Notes (Signed)
Patient has had two bowel movements, feels much better and is ready to be discharged. Discharge teaching performed.

## 2015-12-31 NOTE — Progress Notes (Signed)
Called portable to order [place] for patient. Was told there were none available and my patient would be #4 in line to receive one.

## 2016-01-06 NOTE — Progress Notes (Signed)
   12/29/15 1202  PT Time Calculation  PT Start Time (ACUTE ONLY) 1202  PT Stop Time (ACUTE ONLY) 1228  PT Time Calculation (min) (ACUTE ONLY) 26 min  PT G-Codes **NOT FOR INPATIENT CLASS**  Functional Assessment Tool Used Clinical judgement  Functional Limitation Mobility: Walking and moving around  Mobility: Walking and Moving Around Current Status [id number]) CJ  Mobility: Walking and Moving Around Goal Status [id number]) CI  PT General Charges  $$ ACUTE PT VISIT 1 Procedure  PT Evaluation  $PT Eval Low Complexity 1 Procedure  PT Treatments  $Gait Training 8-22 mins

## 2016-01-08 MED FILL — CARVEDILOL 12.5 MG TABLET: 12.5 | 30 days supply | Qty: 60 | Fill #1

## 2016-01-11 DIAGNOSIS — Z4789 Encounter for other orthopedic aftercare: Secondary | ICD-10-CM | POA: Diagnosis not present

## 2016-01-11 DIAGNOSIS — Z9889 Other specified postprocedural states: Secondary | ICD-10-CM | POA: Diagnosis not present

## 2016-01-11 MED FILL — MELOXICAM 7.5 MG TABLET: 7.5 | 30 days supply | Qty: 30 | Fill #0

## 2016-01-11 MED FILL — GABAPENTIN 300 MG CAPSULE: 300 | 30 days supply | Qty: 90 | Fill #0

## 2016-01-23 ENCOUNTER — Ambulatory Visit: Payer: 59 | Attending: Specialist

## 2016-01-23 DIAGNOSIS — R262 Difficulty in walking, not elsewhere classified: Secondary | ICD-10-CM | POA: Insufficient documentation

## 2016-01-23 DIAGNOSIS — M6281 Muscle weakness (generalized): Secondary | ICD-10-CM | POA: Diagnosis not present

## 2016-01-23 DIAGNOSIS — Z9889 Other specified postprocedural states: Secondary | ICD-10-CM | POA: Insufficient documentation

## 2016-01-23 DIAGNOSIS — M545 Low back pain: Secondary | ICD-10-CM | POA: Diagnosis not present
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
## 2016-01-25 DIAGNOSIS — E559 Vitamin D deficiency, unspecified: Secondary | ICD-10-CM | POA: Diagnosis not present

## 2016-01-25 DIAGNOSIS — I10 Essential (primary) hypertension: Secondary | ICD-10-CM | POA: Diagnosis not present

## 2016-01-25 DIAGNOSIS — Z0000 Encounter for general adult medical examination without abnormal findings: Secondary | ICD-10-CM | POA: Diagnosis not present

## 2016-01-28 MED FILL — METHOCARBAMOL 500 MG TABLET: 500 | 20 days supply | Qty: 60 | Fill #0

## 2016-01-28 MED FILL — OXYCODONE-APAP 7.5-325 MG: 7.5-325 | 10 days supply | Qty: 60 | Fill #0

## 2016-02-05 ENCOUNTER — Encounter: Payer: 59 | Admitting: Physical Therapy

## 2016-02-05 ENCOUNTER — Ambulatory Visit: Payer: 59 | Attending: Specialist | Admitting: Physical Therapy

## 2016-02-05 DIAGNOSIS — Z6827 Body mass index (BMI) 27.0-27.9, adult: Secondary | ICD-10-CM | POA: Diagnosis not present

## 2016-02-05 DIAGNOSIS — M5442 Lumbago with sciatica, left side: Secondary | ICD-10-CM | POA: Diagnosis not present

## 2016-02-05 DIAGNOSIS — R262 Difficulty in walking, not elsewhere classified: Secondary | ICD-10-CM | POA: Insufficient documentation

## 2016-02-05 DIAGNOSIS — M6281 Muscle weakness (generalized): Secondary | ICD-10-CM | POA: Insufficient documentation

## 2016-02-05 DIAGNOSIS — M545 Low back pain: Secondary | ICD-10-CM | POA: Diagnosis not present

## 2016-02-05 DIAGNOSIS — I10 Essential (primary) hypertension: Secondary | ICD-10-CM | POA: Diagnosis not present

## 2016-02-05 DIAGNOSIS — M5440 Lumbago with sciatica, unspecified side: Secondary | ICD-10-CM | POA: Insufficient documentation

## 2016-02-05 DIAGNOSIS — E559 Vitamin D deficiency, unspecified: Secondary | ICD-10-CM | POA: Diagnosis not present

## 2016-02-05 DIAGNOSIS — Z1389 Encounter for screening for other disorder: Secondary | ICD-10-CM | POA: Diagnosis not present

## 2016-02-05 DIAGNOSIS — E784 Other hyperlipidemia: Secondary | ICD-10-CM | POA: Diagnosis not present

## 2016-02-05 DIAGNOSIS — R079 Chest pain, unspecified: Secondary | ICD-10-CM | POA: Diagnosis not present

## 2016-02-05 DIAGNOSIS — K219 Gastro-esophageal reflux disease without esophagitis: Secondary | ICD-10-CM | POA: Diagnosis not present

## 2016-02-05 DIAGNOSIS — M5136 Other intervertebral disc degeneration, lumbar region: Secondary | ICD-10-CM | POA: Diagnosis not present

## 2016-02-05 DIAGNOSIS — M5416 Radiculopathy, lumbar region: Secondary | ICD-10-CM | POA: Diagnosis not present

## 2016-02-05 MED FILL — ATORVASTATIN 20 MG TABLET: 20 | 90 days supply | Qty: 90 | Fill #0

## 2016-02-05 NOTE — Therapy (Signed)
[place], [name], [address] Phone: [phone]   Fax:  [phone]  Physical Therapy Treatment  Patient Details  Name: [name] MRN: [id number] Date of Birth: [birth year] Referring Provider: Donnald Garre  Encounter Date: 02/05/2016    Past Medical History  Diagnosis Date  . Hypertension   . Back pain     related to spinal stenosis and disc problem, radiates down left buttocks to leg., weakness occ.  [name] PONV (postoperative nausea and vomiting)   . Vaginal foreign object     "Uses Femring"    Past Surgical History  Procedure Laterality Date  . Abdominal hysterectomy    . Foot surgery Bilateral     [place] "bunion,bone spur, tendon" (1) -6'16, (1)-10'16  . Cardiac catheterization N/A 04/18/2015    Procedure: Left Heart Cath and Coronary Angiography;  Surgeon: [name], MD;  Location: [place] [facility];  Service: Cardiovascular;  Laterality: N/A;  . Wisdom tooth extraction    . Lumbar laminectomy/decompression microdiscectomy Bilateral 12/28/2015    Procedure: MICRO LUMBAR DECOMPRESSION L4 - L5 BILATERALLY;  Surgeon: [name], MD;  Location: [facility];  Service: Orthopedics;  Laterality: Bilateral;    There were no vitals filed for this visit.      Subjective Assessment - 02/05/16 1551    Subjective I can't afford 2 times a week! My son is in the hospital, he had a stroke and the chairs in the hospital are not comfortable.  See below for alleviating or aggravating factors.     Currently in Pain? Yes   Pain Score 5    Pain Location Back   Pain Orientation Lower   Pain Type Surgical pain   Pain Onset More than a month ago   Pain Frequency Constant           [facility] Adult PT Treatment/Exercise - 02/05/16 1600    Lumbar Exercises: Stretches   Active Hamstring Stretch 3 reps;30 seconds   Pelvic Tilt 5 reps   Piriformis Stretch 2 reps;30 seconds   Lumbar Exercises:  Standing   Other Standing Lumbar Exercises isometric ball press in standing x 10, 5 sec    Lumbar Exercises: Supine   Ab Set 10 reps   AB Set Limitations HEP   Clam 10 reps   Clam Limitations HEP   Heel Slides 10 reps   Heel Slides Limitations HEP    Bent Knee Raise 10 reps   Bent Knee Raise Limitations HEP    Other Supine Lumbar Exercises A/P tilt to establish neutral    Moist Heat Therapy   Number Minutes Moist Heat 15 Minutes   Moist Heat Location Lumbar Spine                  PT Short Term Goals - 02/05/16 1626    PT SHORT TERM GOAL #1   Title Pt will be I with initial HEP for continued strengthening and mobility by 02/22/16    Status On-going   PT SHORT TERM GOAL #2   Title Pt will be able to demo posture and body mechanics as it relates to lumbar spine by 02/22/16.    Status On-going           PT Long Term Goals - 02/05/16 1626    PT LONG TERM GOAL #1   Title FOTO score will improve from 50% limited to 36% to demo improved function and mobility by 03/19/16.  Status On-going   PT LONG TERM GOAL #2   Title Pt will tolerate sitting for 1 hour without pain by 03/19/16.    Status On-going   PT LONG TERM GOAL #3   Title Bilat hip flexion will improve to 4-/5 in order to stand and walk for 30 mins with SPC by 03/19/16.    Status On-going   PT LONG TERM GOAL #4   Title Bilat PF strength will improve to 3/5 in order for pt to ascend and descend steps with 1 UE support and reciprocally to enter and exit building by 03/19/16.   Status On-going               Plan - 02/05/16 1629    Clinical Impression Statement Patient was able to establish HEP with cues.  Pt concerned regarding financial committment for attending full POC.    Rehab Potential Good   PT Frequency 2x / week   PT Duration 8 weeks   PT Treatment/Interventions ADLs/Self Care Home Management;Cryotherapy;Electrical Stimulation;Iontophoresis 4mg /ml Dexamethasone;Moist Heat;Therapeutic  exercise;Therapeutic activities;Functional mobility training;Gait training;DME Instruction;Ultrasound;Neuromuscular re-education;Patient/family education;Manual techniques;Taping;Dry needling;Passive range of motion   PT Next Visit Plan Using RW? Falls? Ab brace, gentle core strengthening, pre-pilates, MT to decrease pain, modalities prn.    PT Home Exercise Plan prepilates please add hamstring and piriformis stretch    Consulted and Agree with Plan of Care Patient      Patient will benefit from skilled therapeutic intervention in order to improve the following deficits and impairments:  Abnormal gait, Decreased activity tolerance, Decreased balance, Decreased mobility, Decreased strength, Increased edema, Postural dysfunction, Improper body mechanics, Impaired flexibility, Impaired perceived functional ability, Pain, Difficulty walking, Decreased range of motion, Decreased endurance, Increased muscle spasms, Decreased knowledge of precautions, Decreased knowledge of use of DME, Decreased coordination  Visit Diagnosis: Difficulty walking  Muscle weakness (generalized)  Midline low back pain with left-sided sciatica     Problem List Patient Active Problem List   Diagnosis Date Noted  . Spinal stenosis of lumbar region 12/28/2015  . Chest pain 04/16/2015    PAA,JENNIFER 02/05/2016, 4:32 PM  [facility] [address] [place], [name], [address] Phone: [phone]   Fax:  [phone]  Name: [name] MRN: [id number] Date of Birth: September 14, 1961   [name], PT 02/05/2016 4:33 PM Phone: [phone] Fax: [phone]

## 2016-02-05 NOTE — Patient Instructions (Signed)
   PELVIC TILT  Lie on back, legs bent. Exhale, tilting top of pelvis back, pubic bone up, to flatten lower back. Inhale, rolling pelvis opposite way, top forward, pubic bone down, arch in back. Repeat __10__ times. Do __2__ sessions per day. Copyright  VHI. All rights reserved.    Isometric Hold With Pelvic Floor (Hook-Lying)  Lie with hips and knees bent. Slowly inhale, and then exhale. Pull navel toward spine and tighten pelvic floor. Hold for __10_ seconds. Continue to breathe in and out during hold. Rest for _10__ seconds. Repeat __10_ times. Do __2-3_ times a day.   Knee Fold  Lie on back, legs bent, arms by sides. Exhale, lifting knee to chest. Inhale, returning. Keep abdominals flat, navel to spine. Repeat __10__ times, alternating legs. Do __2__ sessions per day.  Knee Drop  Keep pelvis stable. Without rotating hips, slowly drop knee to side, pause, return to center, bring knee across midline toward opposite hip. Feel obliques engaging. Repeat for ___10_ times each leg.   Copyright  VHI. All rights reserved.       Heel Slide to Straight   Slide one leg down to straight. Return. Be sure pelvis does not rock forward, tilt, rotate, or tip to side. Do _10__ times. Restabilize pelvis. Repeat with other leg. Do __1-2_ sets, __2_ times per day.  [email or web address].exer.[email or web address]   Copyright  VHI. All rights reserved.   

## 2016-02-07 ENCOUNTER — Ambulatory Visit: Payer: 59 | Admitting: Physical Therapy

## 2016-02-07 DIAGNOSIS — M6281 Muscle weakness (generalized): Secondary | ICD-10-CM

## 2016-02-07 DIAGNOSIS — M545 Low back pain: Secondary | ICD-10-CM

## 2016-02-07 DIAGNOSIS — M5442 Lumbago with sciatica, left side: Secondary | ICD-10-CM | POA: Diagnosis not present

## 2016-02-07 DIAGNOSIS — M5440 Lumbago with sciatica, unspecified side: Secondary | ICD-10-CM

## 2016-02-07 DIAGNOSIS — R262 Difficulty in walking, not elsewhere classified: Secondary | ICD-10-CM

## 2016-02-07 NOTE — Therapy (Signed)
[place], [name], [address] Phone: [phone]   Fax:  [phone]  Physical Therapy Treatment  Patient Details  Name: [name] MRN: [id number] Date of Birth: [birth year] Referring Provider: Donnald Garre  Encounter Date: 02/07/2016      PT End of Session - 02/07/16 0800    Visit Number 3   Number of Visits 16   Date for PT Re-Evaluation 03/19/16   PT Start Time 0800   PT Stop Time 0905   PT Time Calculation (min) 65 min   Activity Tolerance Patient tolerated treatment well   Behavior During Therapy [facility] for tasks assessed/performed      Past Medical History  Diagnosis Date  . Hypertension   . Back pain     related to spinal stenosis and disc problem, radiates down left buttocks to leg., weakness occ.  [name] PONV (postoperative nausea and vomiting)   . Vaginal foreign object     "Uses Femring"    Past Surgical History  Procedure Laterality Date  . Abdominal hysterectomy    . Foot surgery Bilateral     [place] "bunion,bone spur, tendon" (1) -6'16, (1)-10'16  . Cardiac catheterization N/A 04/18/2015    Procedure: Left Heart Cath and Coronary Angiography;  Surgeon: [name], MD;  Location: [place] [facility];  Service: Cardiovascular;  Laterality: N/A;  . Wisdom tooth extraction    . Lumbar laminectomy/decompression microdiscectomy Bilateral 12/28/2015    Procedure: MICRO LUMBAR DECOMPRESSION L4 - L5 BILATERALLY;  Surgeon: [name], MD;  Location: [facility];  Service: Orthopedics;  Laterality: Bilateral;    There were no vitals filed for this visit.      Subjective Assessment - 02/07/16 0805    Subjective I dont' like using the rolling walker. I left it in the car.  I am using the 3 pronged straight cane. My son had TIA's and is in the hospital with DM 2.     Pertinent History hysterectomy, spinal stenosis   Limitations Sitting;Standing;Walking;House hold activities   How long can you sit comfortably? 15   How long can you stand comfortably? 15   How long can you walk comfortably? 15 with cane    Patient Stated Goals return to work , return to PLOF (walking for 30 mins/ day )    Currently in Pain? Yes   Pain Score 5    Pain Location Back   Pain Orientation Lower   Pain Descriptors / Indicators Throbbing;Tightness  tingling in the bottom of feet   Pain Type Surgical pain   Pain Onset More than a month ago            [facility] PT Assessment - 02/07/16 0830    Strength   Right Hip Flexion 3-/5   Left Hip Flexion 3-/5   Right Knee Flexion 3+/5   Right Knee Extension 3+/5   Left Knee Flexion 3/5   Left Knee Extension 3/5   Right Ankle Plantar Flexion 2+/5   Left Ankle Plantar Flexion 2+/5                     [facility] Adult PT Treatment/Exercise - 02/07/16 0811    Self-Care   Self-Care Other Self-Care Comments   Other Self-Care Comments  use of tennis ball for added stretch/myofascial , educated on importance of using RW to reduce risk of falls during healing phase   Lumbar Exercises: Stretches   Active Hamstring Stretch 3 reps;30  seconds  bil   Active Hamstring Stretch Limitations Pt reaches and holds behind knee  Pt should bend knee on opposite side to protect back VC   Pelvic Tilt 5 reps   Piriformis Stretch 2 reps;30 seconds   Piriformis Stretch Limitations Pt also shown alternative stretch by crossing right knee over left and flexing toward chest for added stretch   Lumbar Exercises: Standing   Other Standing Lumbar Exercises isometric ball press in standing x 10, 5 sec    Other Standing Lumbar Exercises Rotate and extend on left and right x 10  have patient reach down her leg to extend after rotation   Lumbar Exercises: Supine   Ab Set 10 reps   AB Set Limitations --   Clam 10 reps   Clam Limitations --   Heel Slides 10 reps   Heel Slides Limitations  bil VC for technique   Bent Knee Raise 10 reps   Bent Knee Raise  Limitations VC bil for technique   Bridge 10 reps;3 seconds   Bridge Limitations stretching pain   Other Supine Lumbar Exercises --   Moist Heat Therapy   Number Minutes Moist Heat 15 Minutes   Moist Heat Location Lumbar Spine   Electrical Stimulation   Electrical Stimulation Location lumbar   Electrical Stimulation Action IFC   Electrical Stimulation Parameters to pt tol 6   Electrical Stimulation Goals Pain   Manual Therapy   Manual Therapy --  attempted but does not tolerate today                PT Education - 02/07/16 [phone]    Education provided Yes   Education Details reviewed HEP and added piriformis stretch and standing rotation and extension bil added to HEP and use of tennis ball for self myofascial stretch   Person(s) Educated Patient   Methods Explanation;Demonstration;Handout;Verbal cues   Comprehension Verbalized understanding;Returned demonstration;Tactile cues required;Verbal cues required          PT Short Term Goals - 02/07/16 1037    PT SHORT TERM GOAL #1   Title Pt will be I with initial HEP for continued strengthening and mobility by 02/22/16    Time 4   Period Weeks   Status On-going   PT SHORT TERM GOAL #2   Title Pt will be able to demo posture and body mechanics as it relates to lumbar spine by 02/22/16.    Time 4   Period Weeks   Status On-going           PT Long Term Goals - 02/07/16 1037    PT LONG TERM GOAL #1   Title FOTO score will improve from 50% limited to 36% to demo improved function and mobility by 03/19/16.     Time 8   Period Weeks   Status On-going   PT LONG TERM GOAL #2   Title Pt will tolerate sitting for 1 hour without pain by 03/19/16.    Time 8   Period Weeks   Status On-going   PT LONG TERM GOAL #3   Title Bilat hip flexion will improve to 4-/5 in order to stand and walk for 30 mins with SPC by 03/19/16.    Time 8   Period Weeks   Status On-going   PT LONG TERM GOAL #4   Title Bilat PF strength will improve  to 3/5 in order for pt to ascend and descend steps with 1 UE support and reciprocally to enter and exit building  by 03/19/16.   Time 8   Period Weeks   Status On-going               Plan - 02/07/16 0820    Clinical Impression Statement Pt was instructed to use Rolling walker at all times and extensively was educated on adverse effects of falling post surgery.  Pt verbalized understanding and promises to use RW although she is uncomfortable using it due to " feeling old".  Pt  was compliant with doing exericises and added to HEP to modify stretch.for piriformis and  added self myofascial technique for piriformis using tennis ball.  Pt  still reports numbness down to bottom of feet.  pt complained of pain in back with R>L today.  Pt reports compliance with exericise at home and walks into clinic using straight point cane with 3 prongs.     Rehab Potential Good   PT Frequency 2x / week   PT Duration 8 weeks   PT Treatment/Interventions ADLs/Self Care Home Management;Cryotherapy;Electrical Stimulation;Iontophoresis 4mg /ml Dexamethasone;Moist Heat;Therapeutic exercise;Therapeutic activities;Functional mobility training;Gait training;DME Instruction;Ultrasound;Neuromuscular re-education;Patient/family education;Manual techniques;Taping;Dry needling;Passive range of motion   PT Next Visit Plan check on RW?  has in car but was not using.  Pt strongly educated on safety and need for use of walker for avoidance of falls post surgery.  Added to HEP.  Review HEP and progress core strength.   PT Home Exercise Plan prepilates and piriformis ./hamstring stretch. tennis ball release   Consulted and Agree with Plan of Care Patient      Patient will benefit from skilled therapeutic intervention in order to improve the following deficits and impairments:  Abnormal gait, Decreased activity tolerance, Decreased balance, Decreased mobility, Decreased strength, Increased edema, Postural dysfunction, Improper body  mechanics, Impaired flexibility, Impaired perceived functional ability, Pain, Difficulty walking, Decreased range of motion, Decreased endurance, Increased muscle spasms, Decreased knowledge of precautions, Decreased knowledge of use of DME, Decreased coordination  Visit Diagnosis: Difficulty in walking, not elsewhere classified  Muscle weakness (generalized)  Midline low back pain with sciatica, sciatica laterality unspecified  Acute bilateral low back pain, with sciatica presence unspecified     Problem List Patient Active Problem List   Diagnosis Date Noted  . Spinal stenosis of lumbar region 12/28/2015  . Chest pain 04/16/2015    Voncille Lo, PT 02/07/2016 10:45 AM Phone: [phone] Fax: [facility] [address] [place], [name], [address] Phone: [phone]   Fax:  [phone]  Name: [name] MRN: [id number] Date of Birth: 1961/07/09

## 2016-02-07 NOTE — Patient Instructions (Addendum)
PLease use tennis ball as shown in clinic for added piriformis stretch on right buttock.  Do not roll repeatedly.  Place and add weight for stretch as shown in clinic.  Piriformis (Supine)  Cross legs, right on top. Gently pull other knee toward chest until stretch is felt in buttock/hip of top leg. Hold 30-60 sec____ seconds. Cross Right knee directly over left and flex toward chest as shown in clinic.Repeat _2-3___ times per set. Do ___1_ sets per session. Do _2-3___ sessions per day.   Also added standing back extension with rotation with Sheila's hand running down back of legs 10 x to left and then 10 x to right to tolerance.  NO PAIN.   Voncille Lo, PT 02/07/2016 8:34 AM Phone: [phone] Fax: [phone]

## 2016-02-12 ENCOUNTER — Ambulatory Visit: Payer: 59 | Admitting: Physical Therapy

## 2016-02-12 DIAGNOSIS — R262 Difficulty in walking, not elsewhere classified: Secondary | ICD-10-CM | POA: Diagnosis not present

## 2016-02-12 DIAGNOSIS — M6281 Muscle weakness (generalized): Secondary | ICD-10-CM | POA: Diagnosis not present

## 2016-02-12 DIAGNOSIS — M5440 Lumbago with sciatica, unspecified side: Secondary | ICD-10-CM

## 2016-02-12 DIAGNOSIS — M5442 Lumbago with sciatica, left side: Secondary | ICD-10-CM | POA: Diagnosis not present

## 2016-02-12 DIAGNOSIS — M545 Low back pain: Secondary | ICD-10-CM | POA: Diagnosis not present

## 2016-02-12 NOTE — Patient Instructions (Signed)
      Raise leg until knee is straight. _5-10__ reps per set, __2_ sets per day, _7__ days per week  FLEXION: Sitting (Active)    Sit, both feet flat. Lift right knee toward ceiling. Use _0__ lbs. Complete _2__ sets of _5-10__ repetitions. Perform __2_ sessions per day.  Copyright  VHI. All rights reserved.

## 2016-02-12 NOTE — Therapy (Signed)
[place], [name], [address] Phone: [phone]   Fax:  [phone]  Physical Therapy Treatment  Patient Details  Name: [name] MRN: [id number] Date of Birth: 1961/06/16 Referring Provider: Donnald Garre  Encounter Date: 02/12/2016      PT End of Session - 02/12/16 0858    Visit Number 4   Number of Visits 16   Date for PT Re-Evaluation 04/03/16   PT Start Time 0802   PT Stop Time 0845   PT Time Calculation (min) 43 min      Past Medical History  Diagnosis Date  . Hypertension   . Back pain     related to spinal stenosis and disc problem, radiates down left buttocks to leg., weakness occ.  [name] PONV (postoperative nausea and vomiting)   . Vaginal foreign object     "Uses Femring"    Past Surgical History  Procedure Laterality Date  . Abdominal hysterectomy    . Foot surgery Bilateral     [place] "bunion,bone spur, tendon" (1) -6'16, (1)-10'16  . Cardiac catheterization N/A 04/18/2015    Procedure: Left Heart Cath and Coronary Angiography;  Surgeon: [name], MD;  Location: [facility];  Service: Cardiovascular;  Laterality: N/A;  . Wisdom tooth extraction    . Lumbar laminectomy/decompression microdiscectomy Bilateral 12/28/2015    Procedure: MICRO LUMBAR DECOMPRESSION L4 - L5 BILATERALLY;  Surgeon: [name], MD;  Location: [facility];  Service: Orthopedics;  Laterality: Bilateral;    There were no vitals filed for this visit.      Subjective Assessment - 02/12/16 0809    Subjective The doctor thinks it's my quad. weakness that keeps me falling.   Pain Score 5    Pain Location Back                         [facility] Adult PT Treatment/Exercise - 02/12/16 0001    Exercises   Exercises Knee/Hip   Lumbar Exercises: Supine   Clam 10 reps   Heel Slides 10 reps   Bent Knee Raise 10 reps   Bridge 10 reps;3 seconds   Straight Leg Raise 10 reps   able to do on rt. too painful on lt   Knee/Hip Exercises: Seated   Long Arc Quad Strengthening;Both;2 sets;10 reps   Marching Both;5 reps;2 sets   Knee/Hip Exercises: Supine   Short Arc [employer] Strengthening;10 reps;Both  bolster under knee   Ankle Exercises: Seated   Ankle Circles/Pumps 10 reps   Heel Raises 10 reps   Toe Raise 10 reps                PT Education - 02/12/16 0858    Education provided Yes   Education Details Seated LAQ, MArch   Person(s) Educated Patient   Methods Explanation;Handout   Comprehension Verbalized understanding          PT Short Term Goals - 02/07/16 1037    PT SHORT TERM GOAL #1   Title Pt will be I with initial HEP for continued strengthening and mobility by 02/22/16    Time 4   Period Weeks   Status On-going   PT SHORT TERM GOAL #2   Title Pt will be able to demo posture and body mechanics as it relates to lumbar spine by 02/22/16.    Time 4   Period Weeks   Status On-going  PT Long Term Goals - 02/07/16 1037    PT LONG TERM GOAL #1   Title FOTO score will improve from 50% limited to 36% to demo improved function and mobility by 04/03/16   Time 8   Period Weeks   Status On-going   PT LONG TERM GOAL #2   Title Pt will tolerate sitting for 1 hour without pain by 04/03/16   Time 8   Period Weeks   Status On-going   PT LONG TERM GOAL #3   Title Bilat hip flexion will improve to 4-/5 in order to stand and walk for 30 mins with SPC by 04/03/16    Time 8   Period Weeks   Status On-going   PT LONG TERM GOAL #4   Title Bilat PF strength will improve to 3/5 in order for pt to ascend and descend steps with 1 UE support and reciprocally to enter and exit building by 04/03/16   Time 8   Period Weeks   Status On-going               Plan - 02/12/16 [id number]    Clinical Impression Statement Pt enters with RW. She reports MD thinks her left quad is weak and is the reason for her leg is giving away frequently. Instructed  pt in seated and supine knee strengthening as well as review of prepilates core HEP. Pt with increased pain with all left LE motions. Cues to tighten core and maintain neutral spine throughout exercises. Pt with questions about standing against ball on wall and performing squats. Recommended pt not perfrom these until knees are stronger due to fall risk. Instructed pt in sit-stand exercises. Pt able to perform without UE support x 5 then on her 6th rep her left knee gave away as she lowered to the mat table and she fell in front of the mat table onto her buttocks. She reports pain 6/10 at end of session. She plans to ice as soon as she gets home. She declines calling her MD or going to the ER. She reports she falls frequently and does not think she hurt anything.     PT Next Visit Plan Continue knee/hip  strength, knee frequently gives away. continue core, continue to recommend RW use, pt fell during treatment.       Patient will benefit from skilled therapeutic intervention in order to improve the following deficits and impairments:  Abnormal gait, Decreased activity tolerance, Decreased balance, Decreased mobility, Decreased strength, Increased edema, Postural dysfunction, Improper body mechanics, Impaired flexibility, Impaired perceived functional ability, Pain, Difficulty walking, Decreased range of motion, Decreased endurance, Increased muscle spasms, Decreased knowledge of precautions, Decreased knowledge of use of DME, Decreased coordination  Visit Diagnosis: Difficulty in walking, not elsewhere classified  Muscle weakness (generalized)  Midline low back pain with sciatica, sciatica laterality unspecified     Problem List Patient Active Problem List   Diagnosis Date Noted  . Spinal stenosis of lumbar region 12/28/2015  . Chest pain 04/16/2015    Dorene Ar, PTA 02/12/2016, 9:07 AM  [place], [name], [address] Phone: [phone]   Fax:  [phone]  Name: [name] MRN: [id number] Date of Birth: October 06, 1961

## 2016-02-14 ENCOUNTER — Ambulatory Visit: Payer: 59 | Admitting: Physical Therapy

## 2016-02-14 DIAGNOSIS — M5442 Lumbago with sciatica, left side: Secondary | ICD-10-CM | POA: Diagnosis not present

## 2016-02-14 DIAGNOSIS — M545 Low back pain: Secondary | ICD-10-CM | POA: Diagnosis not present

## 2016-02-14 DIAGNOSIS — R262 Difficulty in walking, not elsewhere classified: Secondary | ICD-10-CM

## 2016-02-14 DIAGNOSIS — M6281 Muscle weakness (generalized): Secondary | ICD-10-CM

## 2016-02-14 DIAGNOSIS — M5440 Lumbago with sciatica, unspecified side: Secondary | ICD-10-CM

## 2016-02-14 NOTE — Therapy (Signed)
[place], [name], [address] Phone: [phone]   Fax:  [phone]  Physical Therapy Treatment  Patient Details  Name: [name] MRN: [id number] Date of Birth: 1961-10-21 Referring Provider: Donnald Garre  Encounter Date: 02/14/2016      PT End of Session - 02/14/16 1042    Visit Number 5   Number of Visits 16   Date for PT Re-Evaluation 04/03/16   PT Start Time 0756   PT Stop Time 0845   PT Time Calculation (min) 49 min      Past Medical History  Diagnosis Date  . Hypertension   . Back pain     related to spinal stenosis and disc problem, radiates down left buttocks to leg., weakness occ.  [name] PONV (postoperative nausea and vomiting)   . Vaginal foreign object     "Uses Femring"    Past Surgical History  Procedure Laterality Date  . Abdominal hysterectomy    . Foot surgery Bilateral     [place] "bunion,bone spur, tendon" (1) -6'16, (1)-10'16  . Cardiac catheterization N/A 04/18/2015    Procedure: Left Heart Cath and Coronary Angiography;  Surgeon: [name], MD;  Location: [facility];  Service: Cardiovascular;  Laterality: N/A;  . Wisdom tooth extraction    . Lumbar laminectomy/decompression microdiscectomy Bilateral 12/28/2015    Procedure: MICRO LUMBAR DECOMPRESSION L4 - L5 BILATERALLY;  Surgeon: [name], MD;  Location: [facility];  Service: Orthopedics;  Laterality: Bilateral;    There were no vitals filed for this visit.      Subjective Assessment - 02/14/16 0928    Subjective Pt. rated her pain in her right hip as a 3/10 before tx.   Currently in Pain? Yes   Pain Score 3    Pain Location Hip   Pain Orientation Left                         [facility] Adult PT Treatment/Exercise - 02/14/16 0001    Exercises   Exercises (p) Ankle   Knee/Hip Exercises: Seated   Long Arc Quad (p) Strengthening;Both;2 sets;10 reps   Other Seated Knee/Hip  Exercises (p) heel raises  2 sets. x 10 reps.   Marching (p) AROM;Strengthening;Both;2 sets;10 reps   Marching Limitations (p) pain on lt. side if flexed too high  said pain was tolerable   Sit to Sand (p) 2 sets;10 reps  7 reps. second set. fatique by second set.   Knee/Hip Exercises: Supine   Short Arc Quad Sets (p) Strengthening;10 reps;Both  bolster under knee   Ankle Exercises: Seated   Ankle Circles/Pumps (p) AROM;Strengthening;Both;10 reps  2 sets.   Toe Raise (p) 10 reps  2 sets.                  PT Short Term Goals - 02/14/16 1123    PT SHORT TERM GOAL #1   Title Pt will be I with initial HEP for continued strengthening and mobility by 02/22/16    Time 4   Period Weeks   Status On-going   PT SHORT TERM GOAL #2   Title Pt will be able to demo posture and body mechanics as it relates to lumbar spine by 02/22/16.    Time 4   Period Weeks   Status On-going           PT Long Term Goals - 02/14/16 1123  PT LONG TERM GOAL #1   Title FOTO score will improve from 50% limited to 36% to demo improved function and mobility by 04/03/16   Time 8   Period Weeks   Status On-going   PT LONG TERM GOAL #2   Title Pt will tolerate sitting for 1 hour without pain by 04/03/16   Time 8   Period Weeks   Status On-going   PT LONG TERM GOAL #3   Title Bilat hip flexion will improve to 4-/5 in order to stand and walk for 30 mins with SPC by 04/03/16    Time 8   Period Weeks   Status On-going   PT LONG TERM GOAL #4   Title Bilat PF strength will improve to 3/5 in order for pt to ascend and descend steps with 1 UE support and reciprocally to enter and exit building by 04/03/16   Time 8   Period Weeks   Status On-going               Plan - 02/14/16 1044    Clinical Impression Statement Pt reports soreness after fall alst visit however had a good day yesterday and reports less pain today. Pt. showed improvement with sit to stand exercise, although she still fatiqued on  the second set. Certain exercises, i.e., SLR's, increased her pain slightly but she able to tolerate. Briefly required VC's to tighten core. Had two people guard her during sit to stand to ensure that she did not fall but she appeared to be able to control without assistance. Stated that she has been doing her HEP and that she has been doing sit to stand at home.  Rated pain as a 3/10 after tx.   PT Next Visit Plan Continue knee/hip strenghtening. Continue VC's for core tightening. Ensure that she is doing sit to stand at home in a location where she can grab something if her knee gives out.      Patient will benefit from skilled therapeutic intervention in order to improve the following deficits and impairments:  Abnormal gait, Decreased activity tolerance, Decreased balance, Decreased mobility, Decreased strength, Increased edema, Postural dysfunction, Improper body mechanics, Impaired flexibility, Impaired perceived functional ability, Pain, Difficulty walking, Decreased range of motion, Decreased endurance, Increased muscle spasms, Decreased knowledge of precautions, Decreased knowledge of use of DME, Decreased coordination  Visit Diagnosis: Difficulty in walking, not elsewhere classified  Muscle weakness (generalized)  Midline low back pain with sciatica, sciatica laterality unspecified  Acute bilateral low back pain, with sciatica presence unspecified     Problem List Patient Active Problem List   Diagnosis Date Noted  . Spinal stenosis of lumbar region 12/28/2015  . Chest pain 04/16/2015   Reva Bores, [address] [place], [place] 02/14/2016, 11:24 AM  [facility] [address] [place], [name], [address] Phone: [phone]   Fax:  [phone]  Name: [name] MRN: [id number] Date of Birth: [birth year]

## 2016-02-19 ENCOUNTER — Ambulatory Visit: Payer: 59 | Admitting: Physical Therapy

## 2016-02-19 DIAGNOSIS — M5440 Lumbago with sciatica, unspecified side: Secondary | ICD-10-CM

## 2016-02-19 DIAGNOSIS — R262 Difficulty in walking, not elsewhere classified: Secondary | ICD-10-CM | POA: Diagnosis not present

## 2016-02-19 DIAGNOSIS — M5442 Lumbago with sciatica, left side: Secondary | ICD-10-CM | POA: Diagnosis not present

## 2016-02-19 DIAGNOSIS — M6281 Muscle weakness (generalized): Secondary | ICD-10-CM

## 2016-02-19 DIAGNOSIS — M545 Low back pain: Secondary | ICD-10-CM | POA: Diagnosis not present

## 2016-02-19 MED FILL — CARVEDILOL 12.5 MG TABLET: 12.5 | 30 days supply | Qty: 60 | Fill #2

## 2016-02-19 NOTE — Patient Instructions (Signed)
Knee Extension: Resisted (Sitting)   With band looped around right ankle and under other foot, straighten leg with ankle loop. Keep other leg bent to increase resistance. Repeat __10__ times per set. Do _2___ sets per session. Do __2__ sessions per day.  [email or web address]   CKnee Flexion: Resisted (Sitting)   Sit with band under left foot and looped around ankle of supported leg. Pull unsupported leg back. Repeat _10___ times per set. Do __2__ sets per session. Do __2__ sessions per day.

## 2016-02-20 NOTE — Therapy (Signed)
[place], [name], [address] Phone: [phone]   Fax:  [phone]  Physical Therapy Treatment  Patient Details  Name: [name] MRN: [id number] Date of Birth: 1961/03/15 Referring Provider: Donnald Garre  Encounter Date: 02/19/2016      PT End of Session - 02/19/16 0928    Visit Number 6   Number of Visits 16   Date for PT Re-Evaluation 04/03/16   PT Start Time 0800   PT Stop Time 0900   PT Time Calculation (min) 60 min      Past Medical History  Diagnosis Date  . Hypertension   . Back pain     related to spinal stenosis and disc problem, radiates down left buttocks to leg., weakness occ.  [name] PONV (postoperative nausea and vomiting)   . Vaginal foreign object     "Uses Femring"    Past Surgical History  Procedure Laterality Date  . Abdominal hysterectomy    . Foot surgery Bilateral     [place] "bunion,bone spur, tendon" (1) -6'16, (1)-10'16  . Cardiac catheterization N/A 04/18/2015    Procedure: Left Heart Cath and Coronary Angiography;  Surgeon: [name], MD;  Location: [facility];  Service: Cardiovascular;  Laterality: N/A;  . Wisdom tooth extraction    . Lumbar laminectomy/decompression microdiscectomy Bilateral 12/28/2015    Procedure: MICRO LUMBAR DECOMPRESSION L4 - L5 BILATERALLY;  Surgeon: [name], MD;  Location: [facility];  Service: Orthopedics;  Laterality: Bilateral;    There were no vitals filed for this visit.      Subjective Assessment - 02/19/16 0904    Subjective No pain before tx. Stated that she has been working really hard on her exercises at home. Stated that she walked to end of driveway and back 10 times and felt some pain in lower back after but ice made pain go away.   Currently in Pain? No/denies            [facility] PT Assessment - 02/20/16 0001    Strength   Right Hip Flexion 3/5   Left Hip Flexion 3/5   Right Knee Flexion  3+/5   Right Knee Extension 3+/5   Left Knee Flexion 3/5   Left Knee Extension 3+/5   Right Ankle Plantar Flexion 2+/5   Left Ankle Plantar Flexion 2+/5                     [facility] Adult PT Treatment/Exercise - 02/20/16 0001    Knee/Hip Exercises: Aerobic   Nustep 5 min. level 3  Started level 5 but leg was getting tired so decreased.   Knee/Hip Exercises: Seated   Long Arc Quad AROM;Strengthening;Both;2 sets  2.5lbs on second set. Also done with yellow band.   Long Arc Quad Weight 2 lbs.   Marching AROM;Strengthening;2 sets  x 10 reps.   Marching Limitations pain on left side with weight.   Marching Weights 2 lbs.   Hamstring Curl AROM;Strengthening;Both;1 set  x 10 reps. yellow   Sit to Sand 1 set;with UE support  x 4 reps.   Cryotherapy   Number Minutes Cryotherapy 15 Minutes   Cryotherapy Location Lumbar Spine   Type of Cryotherapy Ice pack   Ankle Exercises: Seated   Heel Raises 10 reps  1 set.   Toe Raise 10 reps  1 set.   Other Seated Ankle Exercises AROM. All motions  1 set. x 10 each direction.  Ankle Exercises: Standing   Heel Raises 10 reps  1 set. unilateral.                PT Education - 02/19/16 0855    Education provided Yes   Education Details yellow band LAQ and hamstring curls   Person(s) Educated Patient   Methods Explanation;Handout   Comprehension Verbalized understanding          PT Short Term Goals - 02/14/16 1123    PT SHORT TERM GOAL #1   Title Pt will be I with initial HEP for continued strengthening and mobility by 02/22/16    Time 4   Period Weeks   Status On-going   PT SHORT TERM GOAL #2   Title Pt will be able to demo posture and body mechanics as it relates to lumbar spine by 02/22/16.    Time 4   Period Weeks   Status On-going           PT Long Term Goals - 02/14/16 1123    PT LONG TERM GOAL #1   Title FOTO score will improve from 50% limited to 36% to demo improved function and mobility by 04/03/16    Time 8   Period Weeks   Status On-going   PT LONG TERM GOAL #2   Title Pt will tolerate sitting for 1 hour without pain by 04/03/16   Time 8   Period Weeks   Status On-going   PT LONG TERM GOAL #3   Title Bilat hip flexion will improve to 4-/5 in order to stand and walk for 30 mins with SPC by 04/03/16    Time 8   Period Weeks   Status On-going   PT LONG TERM GOAL #4   Title Bilat PF strength will improve to 3/5 in order for pt to ascend and descend steps with 1 UE support and reciprocally to enter and exit building by 04/03/16   Time 8   Period Weeks   Status On-going               Plan - 02/19/16 0916    Clinical Impression Statement Instructed pt in proper sitting posture with lumbar roll and foot stool. Safety education also provided due to rolling stools at her work. Pt. felt dizzy as well as LBP while doing calf raises in standing. Stated that she was feeling hot but felt better after sitting down and drinking some water. She stated that she felt pain during seated marches with weight on the left side but that the pain went away when the weight was removed.   PT. achieved LTG #2, and made progress towards LTG #3. Pt returns to work Monday. Bilateral knee extension strength 3+/5 bilateral, able to give yellow band resistance for curs and LAQ in seated. Urged to to continue PT after return to work. More education provided on risk of falls due to knee weakness. Pt reports he is very eager to return to work and plans to be very careful. She may reduce to 4 hours per  after next week.  Pt given ice at end of treatment and reports decreased pain post.    PT Next Visit Plan Continue knee and hip, core strengthening. Ask about new HEP exercises and how things went at work. Possibly test LTG #4 pertaining to bilat. PF strength.      Patient will benefit from skilled therapeutic intervention in order to improve the following deficits and impairments:  Abnormal gait, Decreased activity  tolerance, Decreased balance, Decreased mobility, Decreased strength, Increased edema, Postural dysfunction, Improper body mechanics, Impaired flexibility, Impaired perceived functional ability, Pain, Difficulty walking, Decreased range of motion, Decreased endurance, Increased muscle spasms, Decreased knowledge of precautions, Decreased knowledge of use of DME, Decreased coordination  Visit Diagnosis: Difficulty in walking, not elsewhere classified  Muscle weakness (generalized)  Midline low back pain with sciatica, sciatica laterality unspecified  During this treatment session, the therapist was present, participating in and directing the treatment.   Problem List Patient Active Problem List   Diagnosis Date Noted  . Spinal stenosis of lumbar region 12/28/2015  . Chest pain 04/16/2015   Tendler, Lurena Nida, Dereck Leep, PTA 02/20/2016, 12:10 PM  [facility] [address] [place], [name], [address] Phone: [phone]   Fax:  [phone]  Name: [name] MRN: [id number] Date of Birth: 08-Mar-1961

## 2016-02-21 ENCOUNTER — Ambulatory Visit: Payer: 59 | Admitting: Physical Therapy

## 2016-02-21 VITALS — BP 130/80 | HR 84

## 2016-02-21 DIAGNOSIS — M5442 Lumbago with sciatica, left side: Secondary | ICD-10-CM | POA: Diagnosis not present

## 2016-02-21 DIAGNOSIS — M545 Low back pain: Secondary | ICD-10-CM | POA: Diagnosis not present

## 2016-02-21 DIAGNOSIS — M6281 Muscle weakness (generalized): Secondary | ICD-10-CM | POA: Diagnosis not present

## 2016-02-21 DIAGNOSIS — R262 Difficulty in walking, not elsewhere classified: Secondary | ICD-10-CM | POA: Diagnosis not present

## 2016-02-21 DIAGNOSIS — M5440 Lumbago with sciatica, unspecified side: Secondary | ICD-10-CM

## 2016-02-21 NOTE — Therapy (Signed)
[place], [name], [address] Phone: [phone]   Fax:  [phone]  Physical Therapy Treatment  Patient Details  Name: [name] MRN: [id number] Date of Birth: [birth year] Referring Provider: Donnald Garre  Encounter Date: 02/21/2016      PT End of Session - 02/21/16 1317    Visit Number 7   Number of Visits 16   Date for PT Re-Evaluation 04/03/16   PT Start Time 0800   PT Stop Time 0900   PT Time Calculation (min) 60 min      Past Medical History  Diagnosis Date  . Hypertension   . Back pain     related to spinal stenosis and disc problem, radiates down left buttocks to leg., weakness occ.  [name] PONV (postoperative nausea and vomiting)   . Vaginal foreign object     "Uses Femring"    Past Surgical History  Procedure Laterality Date  . Abdominal hysterectomy    . Foot surgery Bilateral     [place] "bunion,bone spur, tendon" (1) -6'16, (1)-10'16  . Cardiac catheterization N/A 04/18/2015    Procedure: Left Heart Cath and Coronary Angiography;  Surgeon: [name], MD;  Location: [place] [facility];  Service: Cardiovascular;  Laterality: N/A;  . Wisdom tooth extraction    . Lumbar laminectomy/decompression microdiscectomy Bilateral 12/28/2015    Procedure: MICRO LUMBAR DECOMPRESSION L4 - L5 BILATERALLY;  Surgeon: [name], MD;  [facility];  Service: Orthopedics;  Laterality: Bilateral;    Filed Vitals:   02/21/16 0841  BP: 130/80  Pulse: 84  SpO2: 99%        Subjective Assessment - 02/21/16 1253    Subjective No pain before tx. Stated that she probably rolled out of bed incorrectly the other day and her back pain was so bad that she almost called her doctor but that she lied down for a few minutes and it felt better.   Currently in Pain? No/denies                         [facility] Adult PT Treatment/Exercise - 02/21/16 0001    Self-Care   Self-Care Other Self-Care Comments   Other Self-Care Comments  safety education for return to work including using rolling walker and getting into rolling stool safely   Knee/Hip Exercises: Standing   Other Standing Knee Exercises calf raises  3 sets x 10. began feeling hot and lightheaded during 3rd.   Knee/Hip Exercises: Seated   Long Arc Quad AROM;Strengthening;Both;2 sets;10 reps   Long Arc Quad Weight 3 lbs.   Marching AROM;Strengthening;Both;2 sets;10 reps   Cryotherapy   Number Minutes Cryotherapy 15 Minutes   Cryotherapy Location Lumbar Spine   Type of Cryotherapy Ice pack                  PT Short Term Goals - 02/21/16 1622    PT SHORT TERM GOAL #1   Title Pt will be I with initial HEP for continued strengthening and mobility by 02/22/16    Time 4   Period Weeks   Status On-going   PT SHORT TERM GOAL #2   Title Pt will be able to demo posture and body mechanics as it relates to lumbar spine by 02/22/16.    Time 4   Period Weeks   Status On-going           PT Long Term Goals - 02/21/16  [place] GOAL #1   Title FOTO score will improve from 50% limited to 36% to demo improved function and mobility by 04/03/16   Time 8   Period Weeks   Status On-going   PT LONG TERM GOAL #2   Title Pt will tolerate sitting for 1 hour without pain by 04/03/16   Time 8   Period Weeks   Status On-going   PT LONG TERM GOAL #3   Title Bilat hip flexion will improve to 4-/5 in order to stand and walk for 30 mins with SPC by 04/03/16    Time 8   Period Weeks   Status On-going   PT LONG TERM GOAL #4   Title Bilat PF strength will improve to 3/5 in order for pt to ascend and descend steps with 1 UE support and reciprocally to enter and exit building by 04/03/16   Time 8   Period Weeks   Status On-going               Plan - 02/21/16 1302    Clinical Impression Statement Pt. began to feel like headed and like she was having hot flashes during third set of  standing calf raises. Had to sit down; Continues to have some pain with left leg during seated hip raises. Pt. rated pain as a 2/10 after. tx.  Continue education on safety with return to work. Encourage pt. to use rolling walker at all times due to high fall risk. Pt. reports visiting her job yesterday using her [place] only. She was able to use a step stool to get into rolling chair at work with minimal difficulty. Will be seeing how she feels at working for eight hours to see if she will be going back to work or if she will be on short-term disability.   PT Next Visit Plan Continue  standing calf raises but limit to 1-2 sets to avoid dizziness and hot flashes. Continue knee, hip, and core strengthening. Test bilat. PF strength. She is out next week so find out if she will be going to work full-time      Patient will benefit from skilled therapeutic intervention in order to improve the following deficits and impairments:  Abnormal gait, Decreased activity tolerance, Decreased balance, Decreased mobility, Decreased strength, Increased edema, Postural dysfunction, Improper body mechanics, Impaired flexibility, Impaired perceived functional ability, Pain, Difficulty walking, Decreased range of motion, Decreased endurance, Increased muscle spasms, Decreased knowledge of precautions, Decreased knowledge of use of DME, Decreased coordination  Visit Diagnosis: Difficulty in walking, not elsewhere classified  Muscle weakness (generalized)  Midline low back pain with sciatica, sciatica laterality unspecified     Problem List Patient Active Problem List   Diagnosis Date Noted  . Spinal stenosis of lumbar region 12/28/2015  . Chest pain 04/16/2015    Reva Bores, SPTA 02/21/2016, 5:16 PM  [facility] [address] [place], [name], [address] Phone: [phone]   Fax:  [phone]  Name: [name] MRN: [id number] Date of Birth:  12/27/1960

## 2016-03-04 ENCOUNTER — Ambulatory Visit: Payer: 59 | Attending: Specialist | Admitting: Physical Therapy

## 2016-03-04 DIAGNOSIS — M6281 Muscle weakness (generalized): Secondary | ICD-10-CM | POA: Insufficient documentation

## 2016-03-04 DIAGNOSIS — M5440 Lumbago with sciatica, unspecified side: Secondary | ICD-10-CM | POA: Diagnosis not present

## 2016-03-04 DIAGNOSIS — M545 Low back pain: Secondary | ICD-10-CM | POA: Insufficient documentation

## 2016-03-04 DIAGNOSIS — R262 Difficulty in walking, not elsewhere classified: Secondary | ICD-10-CM | POA: Diagnosis not present

## 2016-03-04 NOTE — Therapy (Signed)
[name], [name], [address] Phone: [phone]   Fax:  [phone]  Physical Therapy Treatment  Patient Details  Name: [name] MRN: [id number] Date of Birth: June 07, 1961 Referring Provider: Donnald Garre  Encounter Date: 03/04/2016      PT End of Session - 03/04/16 0839    Visit Number 8   Number of Visits 16   Date for PT Re-Evaluation 04/03/16   PT Start Time 0800   PT Stop Time 0849   PT Time Calculation (min) 49 min   Activity Tolerance Patient tolerated treatment well   Behavior During Therapy [facility] for tasks assessed/performed      Past Medical History  Diagnosis Date  . Hypertension   . Back pain     related to spinal stenosis and disc problem, radiates down left buttocks to leg., weakness occ.  [name] PONV (postoperative nausea and vomiting)   . Vaginal foreign object     "Uses Femring"    Past Surgical History  Procedure Laterality Date  . Abdominal hysterectomy    . Foot surgery Bilateral     [place] "bunion,bone spur, tendon" (1) -6'16, (1)-10'16  . Cardiac catheterization N/A 04/18/2015    Procedure: Left Heart Cath and Coronary Angiography;  Surgeon: [name], MD;  Location: [place] [facility];  Service: Cardiovascular;  Laterality: N/A;  . Wisdom tooth extraction    . Lumbar laminectomy/decompression microdiscectomy Bilateral 12/28/2015    Procedure: MICRO LUMBAR DECOMPRESSION L4 - L5 BILATERALLY;  Surgeon: [name], MD;  Location: [facility];  Service: Orthopedics;  Laterality: Bilateral;    There were no vitals filed for this visit.      Subjective Assessment - 03/04/16 0804    Subjective "started back to work full day, it wasn't easy"    Currently in Pain? Yes   Pain Score 3    Pain Location Hip   Pain Orientation Left   Pain Descriptors / Indicators Throbbing   Pain Frequency Constant   Aggravating Factors  prolonged sitting,    Pain Relieving  Factors ice pack, medications, changing            [facility] PT Assessment - 03/04/16 0001    Strength   Right Ankle Plantar Flexion 3-/5   Left Ankle Plantar Flexion 3/5                     [facility] Adult PT Treatment/Exercise - 03/04/16 0811    Knee/Hip Exercises: Aerobic   Nustep L 4 x 5 min  using LE only   Knee/Hip Exercises: Standing   Heel Raises 2 sets;10 reps   Hip Abduction AROM;Stengthening;Both;1 set;10 reps  2#   Hip Extension AROM;Stengthening;Both;1 set;10 reps  2#   Knee/Hip Exercises: Seated   Marching AROM;Strengthening;Both;2 sets;10 reps   Abduction/Adduction  AROM;Strengthening  abduction 2 x 10 red theraband Modified to from standing to avoid activation of QL   Ice pack Low back in sitting x 10 min    Sit to Sand 2 sets;5 reps  controlled eccentric lowering       I              PT Education - 03/04/16 0839    Education provided Yes   Education Details updated HEP   Person(s) Educated Patient   Methods Explanation   Comprehension Verbalized understanding          PT Short Term Goals - 03/04/16 0845  PT SHORT TERM GOAL #1   Title Pt will be I with initial HEP for continued strengthening and mobility by 02/22/16    Time 4   Period Weeks   Status On-going   PT SHORT TERM GOAL #2   Title Pt will be able to demo posture and body mechanics as it relates to lumbar spine by 02/22/16.    Time 4   Period Weeks   Status On-going           PT Long Term Goals - 02/21/16 1626    PT LONG TERM GOAL #1   Title FOTO score will improve from 50% limited to 36% to demo improved function and mobility by 04/03/16   Time 8   Period Weeks   Status On-going   PT LONG TERM GOAL #2   Title Pt will tolerate sitting for 1 hour without pain by 04/03/16   Time 8   Period Weeks   Status On-going   PT LONG TERM GOAL #3   Title Bilat hip flexion will improve to 4-/5 in order to stand and walk for 30 mins with SPC by 04/03/16    Time 8   Period  Weeks   Status On-going   PT LONG TERM GOAL #4   Title Bilat PF strength will improve to 3/5 in order for pt to ascend and descend steps with 1 UE support and reciprocally to enter and exit building by 04/03/16   Time 8   Period Weeks   Status On-going               Plan - 03/04/16 0840    Clinical Impression Statement Mrs. Mangrum reports 3/10 pain today, stating she returned full time to work and that it was difficult. She has improved her PF strength to 3-/5 bil. during standing hip abduction pt compensations on the L with hip hiking reporting pain along the QL, attempted trial of seated hip abduction to focus strengthening of the abductors and rest the QL which she reported relief with. used Ice pack post session for soreness, she reports having to head straight to work after therapy.  No progress toward goals.    PT Frequency 3x / week   PT Next Visit Plan heel raises monitor pt for hot flashes/ dizziness,  Continue knee, hip, and core strengthening, ice PRN   PT Home Exercise Plan seated hip abduction    Consulted and Agree with Plan of Care Patient      Patient will benefit from skilled therapeutic intervention in order to improve the following deficits and impairments:  Abnormal gait, Decreased activity tolerance, Decreased balance, Decreased mobility, Decreased strength, Increased edema, Postural dysfunction, Improper body mechanics, Impaired flexibility, Impaired perceived functional ability, Pain, Difficulty walking, Decreased range of motion, Decreased endurance, Increased muscle spasms, Decreased knowledge of precautions, Decreased knowledge of use of DME, Decreased coordination  Visit Diagnosis: Difficulty in walking, not elsewhere classified  Muscle weakness (generalized)  Midline low back pain with sciatica, sciatica laterality unspecified     Problem List Patient Active Problem List   Diagnosis Date Noted  . Spinal stenosis of lumbar region 12/28/2015  .  Chest pain 04/16/2015   [place] PT, DPT, LAT, ATC  03/04/2016  8:48 AM      [facility] [address] [place], [name], [address] Phone: [phone]   Fax:  [phone]  Name: [name] MRN: [id number] Date of Birth: 06-14-1961

## 2016-03-07 ENCOUNTER — Ambulatory Visit: Payer: 59 | Admitting: Physical Therapy

## 2016-03-07 DIAGNOSIS — R262 Difficulty in walking, not elsewhere classified: Secondary | ICD-10-CM | POA: Diagnosis not present

## 2016-03-07 DIAGNOSIS — M6281 Muscle weakness (generalized): Secondary | ICD-10-CM

## 2016-03-07 DIAGNOSIS — M5440 Lumbago with sciatica, unspecified side: Secondary | ICD-10-CM

## 2016-03-07 DIAGNOSIS — M545 Low back pain: Secondary | ICD-10-CM

## 2016-03-07 NOTE — Therapy (Signed)
[place], [name], [address] Phone: [phone]   Fax:  [phone]  Physical Therapy Treatment  Patient Details  Name: [name] MRN: [id number] Date of Birth: December 18, 1960 Referring Provider: Donnald Garre  Encounter Date: 03/07/2016      PT End of Session - 03/07/16 0814    Visit Number 9   Number of Visits 16   Date for PT Re-Evaluation 04/03/16   PT Start Time 0800   PT Stop Time 0850   PT Time Calculation (min) 50 min      Past Medical History  Diagnosis Date  . Hypertension   . Back pain     related to spinal stenosis and disc problem, radiates down left buttocks to leg., weakness occ.  [name] PONV (postoperative nausea and vomiting)   . Vaginal foreign object     "Uses Femring"    Past Surgical History  Procedure Laterality Date  . Abdominal hysterectomy    . Foot surgery Bilateral     [place] "bunion,bone spur, tendon" (1) -6'16, (1)-10'16  . Cardiac catheterization N/A 04/18/2015    Procedure: Left Heart Cath and Coronary Angiography;  Surgeon: [name], MD;  Location: [place] [facility];  Service: Cardiovascular;  Laterality: N/A;  . Wisdom tooth extraction    . Lumbar laminectomy/decompression microdiscectomy Bilateral 12/28/2015    Procedure: MICRO LUMBAR DECOMPRESSION L4 - L5 BILATERALLY;  Surgeon: [name], MD;  Location: [facility];  Service: Orthopedics;  Laterality: Bilateral;    There were no vitals filed for this visit.      Subjective Assessment - 03/07/16 0803    Subjective A co-worker caught me when my knee gave out as I was squatting to pick things up at work. I am doing good at work.    Currently in Pain? Yes   Pain Score 2    Pain Location Back   Pain Orientation Left   Pain Radiating Towards Left buttock, calf foot                         [facility] Adult PT Treatment/Exercise - 03/07/16 0001    Lumbar Exercises: Supine   Glut  Set 10 reps   Glut Set Limitations unable to lift into bridge without pain   Clam 20 reps   Clam Limitations red band   Heel Slides 20 reps   Bent Knee Raise 10 reps   Straight Leg Raise 10 reps   Straight Leg Raises Limitations right only due to pain with left, small pulses with left leg in comfortable ROM   Knee/Hip Exercises: Stretches   Piriformis Stretch 3 reps;30 seconds   Piriformis Stretch Limitations knee to oppsotie shoulder, and modified figure four   Knee/Hip Exercises: Seated   Sit to Sand 10 reps;without UE support   Cryotherapy   Number Minutes Cryotherapy 10 Minutes   Cryotherapy Location Lumbar Spine   Type of Cryotherapy Ice pack   Ankle Exercises: Standing   Heel Raises 10 reps   Toe Raise 10 reps                  PT Short Term Goals - 03/07/16 0816    PT SHORT TERM GOAL #1   Title Pt will be I with initial HEP for continued strengthening and mobility by 02/22/16    Time 4   Period Weeks   Status Achieved   PT SHORT TERM GOAL #2  Title Pt will be able to demo posture and body mechanics as it relates to lumbar spine by 02/22/16.    Time 4   Period Weeks   Status Achieved           PT Long Term Goals - 02/21/16 1626    PT LONG TERM GOAL #1   Title FOTO score will improve from 50% limited to 36% to demo improved function and mobility by 04/03/16   Time 8   Period Weeks   Status On-going   PT LONG TERM GOAL #2   Title Pt will tolerate sitting for 1 hour without pain by 04/03/16   Time 8   Period Weeks   Status On-going   PT LONG TERM GOAL #3   Title Bilat hip flexion will improve to 4-/5 in order to stand and walk for 30 mins with SPC by 04/03/16    Time 8   Period Weeks   Status On-going   PT LONG TERM GOAL #4   Title Bilat PF strength will improve to 3/5 in order for pt to ascend and descend steps with 1 UE support and reciprocally to enter and exit building by 04/03/16   Time 8   Period Weeks   Status On-going                Plan - 03/07/16 0805    Clinical Impression Statement Able to stand/walk 30-40 minutes at work with [employer]  prior to increased pain. Sitting tolerance 30 minutes. Independent with initial HEP and with proper posture and body mechanics. STG#1,2, MET. Instructed pt in LE strengthening as well as hip stretching to decreased left hip pain and tightness. Updated HEP. Some pain with left hip stretching, pt cued to stretch gently.    PT Next Visit Plan core, LE strength, hip stretching FOTO      Patient will benefit from skilled therapeutic intervention in order to improve the following deficits and impairments:  Abnormal gait, Decreased activity tolerance, Decreased balance, Decreased mobility, Decreased strength, Increased edema, Postural dysfunction, Improper body mechanics, Impaired flexibility, Impaired perceived functional ability, Pain, Difficulty walking, Decreased range of motion, Decreased endurance, Increased muscle spasms, Decreased knowledge of precautions, Decreased knowledge of use of DME, Decreased coordination  Visit Diagnosis: Difficulty in walking, not elsewhere classified  Muscle weakness (generalized)  Midline low back pain with sciatica, sciatica laterality unspecified  Acute bilateral low back pain, with sciatica presence unspecified     Problem List Patient Active Problem List   Diagnosis Date Noted  . Spinal stenosis of lumbar region 12/28/2015  . Chest pain 04/16/2015    Dorene Ar, PTA 03/07/2016, 9:17 AM  [place], [name], [address] Phone: [phone]   Fax:  [phone]  Name: [name] MRN: [id number] Date of Birth: 1961/02/15

## 2016-03-14 ENCOUNTER — Ambulatory Visit: Payer: 59 | Admitting: Physical Therapy

## 2016-03-18 ENCOUNTER — Ambulatory Visit: Payer: 59 | Admitting: Physical Therapy

## 2016-03-18 DIAGNOSIS — M6281 Muscle weakness (generalized): Secondary | ICD-10-CM | POA: Diagnosis not present

## 2016-03-18 DIAGNOSIS — R262 Difficulty in walking, not elsewhere classified: Secondary | ICD-10-CM | POA: Diagnosis not present

## 2016-03-18 DIAGNOSIS — M5440 Lumbago with sciatica, unspecified side: Secondary | ICD-10-CM | POA: Diagnosis not present

## 2016-03-18 DIAGNOSIS — M545 Low back pain: Secondary | ICD-10-CM | POA: Diagnosis not present

## 2016-03-18 NOTE — Therapy (Signed)
[place], [name], [address] Phone: [phone]   Fax:  [phone]  Physical Therapy Treatment  Patient Details  Name: [name] MRN: [id number] Date of Birth: 05/08/1961 Referring Provider: Donnald Garre  Encounter Date: 03/18/2016      PT End of Session - 03/18/16 1802    Visit Number 10   Number of Visits 16   Date for PT Re-Evaluation 04/03/16   PT Start Time 0805   PT Stop Time 0850   PT Time Calculation (min) 45 min   Activity Tolerance Patient tolerated treatment well   Behavior During Therapy [facility] for tasks assessed/performed      Past Medical History  Diagnosis Date  . Hypertension   . Back pain     related to spinal stenosis and disc problem, radiates down left buttocks to leg., weakness occ.  [name] PONV (postoperative nausea and vomiting)   . Vaginal foreign object     "Uses Femring"    Past Surgical History  Procedure Laterality Date  . Abdominal hysterectomy    . Foot surgery Bilateral     [place] "bunion,bone spur, tendon" (1) -6'16, (1)-10'16  . Cardiac catheterization N/A 04/18/2015    Procedure: Left Heart Cath and Coronary Angiography;  Surgeon: [name], MD;  Location: [place] [facility];  Service: Cardiovascular;  Laterality: N/A;  . Wisdom tooth extraction    . Lumbar laminectomy/decompression microdiscectomy Bilateral 12/28/2015    Procedure: MICRO LUMBAR DECOMPRESSION L4 - L5 BILATERALLY;  Surgeon: [name], MD;  [facility];  Service: Orthopedics;  Laterality: Bilateral;    There were no vitals filed for this visit.      Subjective Assessment - 03/18/16 0811    Subjective 2/10,  sore.  I'm doing well.  I started using the cane at [facility] on the ramp.  Less use of walker.    Currently in Pain? Yes   Pain Score 2    Pain Location Back   Pain Orientation Left   Pain Descriptors / Indicators Throbbing   Pain Type Surgical pain   Pain  Radiating Towards behind knee, on Sunday   Pain Frequency Intermittent   Aggravating Factors  walking,  sitting too long                         [facility] Adult PT Treatment/Exercise - 03/18/16 0001    Lumbar Exercises: Supine   Ab Set 5 reps  cues to find neutral pain free position and hold ,   Clam 10 reps  single, pre pilates   Heel Slides 10 reps  cues   Bent Knee Raise 10 reps   Bent Knee Raise Limitations 2 pounds ankle weights   Bridge 10 reps   Bridge Limitations more of a tilt   Knee/Hip Exercises: Standing   Terminal Knee Extension Limitations 10 X with ball,  HEP   Knee/Hip Exercises: Seated   Hamstring Curl 10 reps;1 set  bands added to home exercises.    Cryotherapy   Number Minutes Cryotherapy 5 Minutes   Cryotherapy Location Lumbar Spine  sitting   Type of Cryotherapy --  cold pack                PT Education - 03/18/16 1757    Education provided Yes   Education Details hamstring bands,  terminal knee extension.    Person(s) Educated Patient   Methods Explanation;Demonstration;Verbal cues;Handout  Comprehension Verbalized understanding;Returned demonstration          PT Short Term Goals - 03/07/16 0816    PT SHORT TERM GOAL #1   Title Pt will be I with initial HEP for continued strengthening and mobility by 02/22/16    Time 4   Period Weeks   Status Achieved   PT SHORT TERM GOAL #2   Title Pt will be able to demo posture and body mechanics as it relates to lumbar spine by 02/22/16.    Time 4   Period Weeks   Status Achieved           PT Long Term Goals - 03/18/16 1807    PT LONG TERM GOAL #1   Title FOTO score will improve from 50% limited to 36% to demo improved function and mobility by 04/03/16   Time 8   Period Weeks   Status Unable to assess   PT LONG TERM GOAL #2   Title Pt will tolerate sitting for 1 hour without pain by 04/03/16   Baseline Patient does not know the answer   Time 8   Period Weeks   Status  On-going   PT LONG TERM GOAL #3   Title Bilat hip flexion will improve to 4-/5 in order to stand and walk for 30 mins with SPC by 04/03/16    Time 8   Period Weeks   Status On-going   PT LONG TERM GOAL #4   Title Bilat PF strength will improve to 3/5 in order for pt to ascend and descend steps with 1 UE support and reciprocally to enter and exit building by 04/03/16   Time 8   Period Weeks   Status On-going               Plan - 03/18/16 1804    Clinical Impression Statement Progress toward home exercise goals with knee strengthening exercises.  Mild pain post exercises decreased with brief use of cold pack.   PT Next Visit Plan FOTO (Patient short staffed at work, could not stay)  review new exercises,  stretch hip, core strengthening to continue.   PT Home Exercise Plan treminal knee with ball at wall,  knee flexion band   Consulted and Agree with Plan of Care Patient      Patient will benefit from skilled therapeutic intervention in order to improve the following deficits and impairments:  Abnormal gait, Decreased activity tolerance, Decreased balance, Decreased mobility, Decreased strength, Increased edema, Postural dysfunction, Improper body mechanics, Impaired flexibility, Impaired perceived functional ability, Pain, Difficulty walking, Decreased range of motion, Decreased endurance, Increased muscle spasms, Decreased knowledge of precautions, Decreased knowledge of use of DME, Decreased coordination  Visit Diagnosis: Difficulty in walking, not elsewhere classified  Muscle weakness (generalized)  Midline low back pain with sciatica, sciatica laterality unspecified  Acute bilateral low back pain, with sciatica presence unspecified     Problem List Patient Active Problem List   Diagnosis Date Noted  . Spinal stenosis of lumbar region 12/28/2015  . Chest pain 04/16/2015    [facility] 03/18/2016, 6:10 PM  [facility] [address] [place], [name], [address] Phone: [phone]   Fax:  [phone]  Name: [name] MRN: [id number] Date of Birth: January 21, 1961    Melvenia Needles, PTA 03/18/2016 6:10 PM Phone: [phone] Fax: [phone]

## 2016-03-18 NOTE — Patient Instructions (Signed)
Hamstring Curl: Resisted (Sitting)    Facing anchor with tubing on right ankle, leg straight out, bend knee. Repeat __10__ times per set. Do _1 to 3___ sets per session. Do 1Terminal Knee Extension (Standing)    Facing anchor with right knee slightly bent and tubing just above knee, gently pull knee back straight. Do not overextend knee. Repeat __10__ times per set. Do _1 to 3___ sets per session. Do __1__ sessions per day.   May press into ball to do this.  http://orth.exer.us/667   Copyright  VHI. All rights reserved.  ____ sessions per day.   http://orth.exer.us/669   Copyright  VHI. All rights reserved.

## 2016-03-21 ENCOUNTER — Ambulatory Visit: Payer: 59

## 2016-03-21 DIAGNOSIS — M545 Low back pain: Secondary | ICD-10-CM | POA: Diagnosis not present

## 2016-03-21 DIAGNOSIS — M5440 Lumbago with sciatica, unspecified side: Secondary | ICD-10-CM | POA: Diagnosis not present

## 2016-03-21 DIAGNOSIS — M6281 Muscle weakness (generalized): Secondary | ICD-10-CM | POA: Diagnosis not present

## 2016-03-21 DIAGNOSIS — R262 Difficulty in walking, not elsewhere classified: Secondary | ICD-10-CM

## 2016-03-21 NOTE — Therapy (Signed)
[name], [name], [address] Phone: [phone]   Fax:  [phone]  Physical Therapy Treatment  Patient Details  Name: [name] MRN: [id number] Date of Birth: 12-10-1960 Referring Provider: Donnald Garre  Encounter Date: 03/21/2016      PT End of Session - 03/21/16 0758    Visit Number 11   Number of Visits 16   Date for PT Re-Evaluation 04/03/16   PT Start Time 0755   PT Stop Time 0845   PT Time Calculation (min) 50 min   Activity Tolerance Patient tolerated treatment well   Behavior During Therapy [facility] for tasks assessed/performed      Past Medical History  Diagnosis Date  . Hypertension   . Back pain     related to spinal stenosis and disc problem, radiates down left buttocks to leg., weakness occ.  [name] PONV (postoperative nausea and vomiting)   . Vaginal foreign object     "Uses Femring"    Past Surgical History  Procedure Laterality Date  . Abdominal hysterectomy    . Foot surgery Bilateral     [place] "bunion,bone spur, tendon" (1) -6'16, (1)-10'16  . Cardiac catheterization N/A 04/18/2015    Procedure: Left Heart Cath and Coronary Angiography;  Surgeon: [name], MD;  Location: [place] [facility];  Service: Cardiovascular;  Laterality: N/A;  . Wisdom tooth extraction    . Lumbar laminectomy/decompression microdiscectomy Bilateral 12/28/2015    Procedure: MICRO LUMBAR DECOMPRESSION L4 - L5 BILATERALLY;  Surgeon: [name], MD;  Location: [facility];  Service: Orthopedics;  Laterality: Bilateral;    There were no vitals filed for this visit.      Subjective Assessment - 03/21/16 0806    Subjective Back doing better and feel I have more stength.    Currently in Pain? Yes   Pain Score 2    Pain Location Back   Pain Orientation Left  to buttock   Pain Descriptors / Indicators Throbbing   Pain Type Surgical pain   Pain Onset More than a month ago   Pain  Frequency Intermittent   Multiple Pain Sites No            [facility] PT Assessment - 03/21/16 0001    Observation/Other Assessments   Other Surveys  Other Surveys   Oswestry Disability Index  34% limited                     [facility] Adult PT Treatment/Exercise - 03/21/16 0001    Lumbar Exercises: Stretches   Single Knee to Chest Stretch 2 reps;30 seconds   Lumbar Exercises: Supine   AB Set Limitations 12 reps 5 sec hold   Clam 10 reps  control without band and ab set.   Heel Slides 10 reps  RT and LT   Heel Slides Limitations RT and LT   Bent Knee Raise --  RT and LT 12 reps   Bent Knee Raise Limitations 2 pounds    Bridge 15 reps   Bridge Limitations cues to peel pelvis and back off mat one segment at a time   Other Supine Lumbar Exercises TKE knee LT on ball with 2 pounds x 15 5sec hold   Knee/Hip Exercises: Seated   Hamstring Curl Left  12 reps red band     ice pack x 10 min post             PT Short Term Goals -  03/21/16 0844    PT SHORT TERM GOAL #1   Title Pt will be I with initial HEP for continued strengthening and mobility by 02/22/16    Status Achieved   PT SHORT TERM GOAL #2   Title Pt will be able to demo posture and body mechanics as it relates to lumbar spine by 02/22/16.    Status Achieved           PT Long Term Goals - 03/21/16 0844    PT LONG TERM GOAL #1   Title FOTO score will improve from 50% limited to 36% to demo improved function and mobility by 04/03/16   Baseline 34% with oswestry   Status Achieved   PT LONG TERM GOAL #2   Title Pt will tolerate sitting for 1 hour without pain by 04/03/16   Status On-going   PT LONG TERM GOAL #3   Title Bilat hip flexion will improve to 4-/5 in order to stand and walk for 30 mins with SPC by 04/03/16    Status Unable to assess   PT LONG TERM GOAL #4   Title Bilat PF strength will improve to 3/5 in order for pt to ascend and descend steps with 1 UE support and reciprocally to enter and exit  building by 04/03/16   Status Unable to assess               Plan - 03/21/16 0758    Clinical Impression Statement Unable to get FOTO as system did not give that option so filled out [place]. She did well with all exercise without incr pain. She will see MD next week She was scheduled 6 more visits.    PT Next Visit Plan Continue with core stabilization exercises. and stretching. MMT nest visit   Consulted and Agree with Plan of Care Patient      Patient will benefit from skilled therapeutic intervention in order to improve the following deficits and impairments:  Abnormal gait, Decreased activity tolerance, Decreased balance, Decreased mobility, Decreased strength, Increased edema, Postural dysfunction, Improper body mechanics, Impaired flexibility, Impaired perceived functional ability, Pain, Difficulty walking, Decreased range of motion, Decreased endurance, Increased muscle spasms, Decreased knowledge of precautions, Decreased knowledge of use of DME, Decreased coordination  Visit Diagnosis: Difficulty in walking, not elsewhere classified  Muscle weakness (generalized)  Midline low back pain with sciatica, sciatica laterality unspecified     Problem List Patient Active Problem List   Diagnosis Date Noted  . Spinal stenosis of lumbar region 12/28/2015  . Chest pain 04/16/2015    Darrel Hoover  PT 03/21/2016, 8:46 AM  [facility] [address] [place], [name], [address] Phone: [phone]   Fax:  [phone]  Name: [name] MRN: [id number] Date of Birth: 05-Apr-1961

## 2016-03-25 DIAGNOSIS — Z4789 Encounter for other orthopedic aftercare: Secondary | ICD-10-CM | POA: Diagnosis not present

## 2016-03-25 MED FILL — CARVEDILOL 12.5 MG TABLET: 12.5 | 30 days supply | Qty: 60 | Fill #3

## 2016-03-25 MED FILL — METHOCARBAMOL 500 MG TABLET: 500 | 10 days supply | Qty: 30 | Fill #0

## 2016-03-28 ENCOUNTER — Ambulatory Visit: Payer: 59 | Attending: Specialist | Admitting: Physical Therapy

## 2016-03-28 DIAGNOSIS — M6281 Muscle weakness (generalized): Secondary | ICD-10-CM | POA: Insufficient documentation

## 2016-03-28 DIAGNOSIS — R262 Difficulty in walking, not elsewhere classified: Secondary | ICD-10-CM | POA: Insufficient documentation

## 2016-03-28 NOTE — Patient Instructions (Signed)
PLANTARFLEXION STRENGTHENING:   Balancing ActANKLE: Plantarflexion, Bilateral - Standing   Stand with upright posture. Raise heels up as high as possible. _10-20__ reps per set, __1-2_ sets per day, 7___ days per week Hold onto a support.       SINGLE LIMB STANCE   Stance: single leg on floor. Raise leg. Hold _10-30__ seconds. Repeat with other leg. __3_ reps per set, __2_ sets per day, __7_ days per week  Copyright  VHI. All rights reserved.

## 2016-03-28 NOTE — Therapy (Addendum)
[place], [name], [address] Phone: [phone]   Fax:  [phone]  Physical Therapy Treatment / Discharge note  Patient Details  Name: [name] MRN: [id number] Date of Birth: [birth year] Referring Provider: Donnald Garre  Encounter Date: 03/28/2016      PT End of Session - 03/28/16 0806    Visit Number 12   Number of Visits 16   Date for PT Re-Evaluation 04/03/16   PT Start Time 0800   PT Stop Time 0838   PT Time Calculation (min) 38 min      Past Medical History  Diagnosis Date  . Hypertension   . Back pain     related to spinal stenosis and disc problem, radiates down left buttocks to leg., weakness occ.  [name] PONV (postoperative nausea and vomiting)   . Vaginal foreign object     "Uses Femring"    Past Surgical History  Procedure Laterality Date  . Abdominal hysterectomy    . Foot surgery Bilateral     [place] "bunion,bone spur, tendon" (1) -6'16, (1)-10'16  . Cardiac catheterization N/A 04/18/2015    Procedure: Left Heart Cath and Coronary Angiography;  Surgeon: [name], MD;  Location: [place] [facility];  Service: Cardiovascular;  Laterality: N/A;  . Wisdom tooth extraction    . Lumbar laminectomy/decompression microdiscectomy Bilateral 12/28/2015    Procedure: MICRO LUMBAR DECOMPRESSION L4 - L5 BILATERALLY;  Surgeon: [name], MD;  Location: [facility];  Service: Orthopedics;  Laterality: Bilateral;    There were no vitals filed for this visit.      Subjective Assessment - 03/28/16 0802    Subjective Saw MD, did xray, got cortisone shot. He said after 2 months may consider other optons.    Currently in Pain? Yes   Pain Score 2    Pain Location Back            [facility] PT Assessment - 03/28/16 0001    Observation/Other Assessments   Focus on Therapeutic Outcomes (FOTO)  45 % limited improved from 50% limited   Strength   Right Hip Flexion 4-/5   Left  Hip Flexion 4-/5   Right Knee Flexion 4/5   Right Knee Extension 4/5   Left Knee Flexion 4/5   Left Knee Extension 4/5   Right Ankle Plantar Flexion 3/5  5 heel raises   Left Ankle Plantar Flexion 3/5  4 heel raises                     [facility] Adult PT Treatment/Exercise - 03/28/16 0001    Ambulation/Gait   Stairs Yes   Stairs Assistance 6: Modified independent (Device/Increase time)   Stair Management Technique One rail Right;One rail Left;Alternating pattern   Number of Stairs 12   Height of Stairs 6   Lumbar Exercises: Standing   Heel Raises 5 reps;10 reps   Heel Raises Limitations single and bilateral   Other Standing Lumbar Exercises SLS 7 sec left 12 sec right   Knee/Hip Exercises: Aerobic   Nustep L5 5 minutes   Knee/Hip Exercises: Seated   Marching 2 sets;10 reps   Marching Limitations added green theraband around thights/ feet.    Sit to Sand 10 reps;without UE support                PT Education - 03/28/16 0914    Education provided Yes   Education Details heel raises, SLS   Person(s)  Educated Patient   Methods Explanation   Comprehension Verbalized understanding          PT Short Term Goals - 03/21/16 0844    PT SHORT TERM GOAL #1   Title Pt will be I with initial HEP for continued strengthening and mobility by 02/22/16    Status Achieved   PT SHORT TERM GOAL #2   Title Pt will be able to demo posture and body mechanics as it relates to lumbar spine by 02/22/16.    Status Achieved           PT Long Term Goals - 03/28/16 0810    PT LONG TERM GOAL #1   Title FOTO score will improve from 50% limited to 36% to demo improved function and mobility by 04/03/16   Baseline 34% with oswestry, 50% with FOTO   Time 8   Period Weeks   Status Partially Met   PT LONG TERM GOAL #2   Title Pt will tolerate sitting for 1 hour without pain by 04/03/16   Baseline 30-40 minutes   Time 8   Period Weeks   Status Partially Met   PT LONG TERM GOAL #3    Title Bilat hip flexion will improve to 4-/5 in order to stand and walk for 30 mins with SPC by 04/03/16    Baseline 4-/5 bilateral   Time 8   Period Weeks   Status Achieved   PT LONG TERM GOAL #4   Title Bilat PF strength will improve to 3/5 in order for pt to ascend and descend steps with 1 UE support and reciprocally to enter and exit building by 04/03/16   Time 8   Period Weeks   Status Achieved               Plan - 03/28/16 0909    Clinical Impression Statement Pt demonstrates improved hip, knee and ankle strength. She is able to perfrom ~ 5 single heel raises and SLS 7-12 seconds bilateral. Encouraged her to continue working on SLS time and PF strength as her ultimate goal is to return to wearing high heeled shoes again. She can ascend and descend reciprocal stairs and 1 handrail. She has no difficulty walking/standing 30 minutes. She can sit 30-45 minutes prior to increased back pain. She tried to alternate positions as needed. She uses proper body mechanics and avoids lifting. She has successfully returned to work. She saw MD recently who took an xray of her Lumbar. He noted another level in her spine which may warrant a future fusion and the patient is frustrated to hear about this possibility. All goals either Met or partially Met. Pt is ready to discharge to HEP.    PT Next Visit Plan DC today.       Patient will benefit from skilled therapeutic intervention in order to improve the following deficits and impairments:  Abnormal gait, Decreased activity tolerance, Decreased balance, Decreased mobility, Decreased strength, Increased edema, Postural dysfunction, Improper body mechanics, Impaired flexibility, Impaired perceived functional ability, Pain, Difficulty walking, Decreased range of motion, Decreased endurance, Increased muscle spasms, Decreased knowledge of precautions, Decreased knowledge of use of DME, Decreased coordination  Visit Diagnosis: Difficulty in walking, not  elsewhere classified  Muscle weakness (generalized)     Problem List Patient Active Problem List   Diagnosis Date Noted  . Spinal stenosis of lumbar region 12/28/2015  . Chest pain 04/16/2015    Dorene Ar, PTA 03/28/2016, 9:19 AM  [place]  [place], [name], [address] Phone: [phone]   Fax:  [phone]  Name: [name] MRN: [id number] Date of Birth: 12-14-1960    PHYSICAL THERAPY DISCHARGE SUMMARY  Visits from Start of Care: 12  Current functional level related to goals / functional outcomes: See goals    Remaining deficits: Intermittent low back pain. Pain noted with prolonged standing/ walking and sitting for longer than 45 min. Continued to demonstrate limited balance with plantar flexor strength and balance.    Education / Equipment: HEP, theraband, posture  Plan: Patient agrees to discharge.  Patient goals were partially met. Patient is being discharged due to being pleased with the current functional level.  ?????        Kristoffer Leamon PT, DPT, LAT, ATC  03/28/2016  12:54 PM

## 2016-04-07 ENCOUNTER — Encounter: Payer: 59 | Admitting: Physical Therapy

## 2016-04-11 ENCOUNTER — Encounter: Payer: 59 | Admitting: Physical Therapy

## 2016-04-25 MED FILL — METHOCARBAMOL 500 MG TABLET: 500 | 10 days supply | Qty: 30 | Fill #1

## 2016-05-01 DIAGNOSIS — Z6829 Body mass index (BMI) 29.0-29.9, adult: Secondary | ICD-10-CM | POA: Diagnosis not present

## 2016-05-01 DIAGNOSIS — Z01419 Encounter for gynecological examination (general) (routine) without abnormal findings: Secondary | ICD-10-CM | POA: Diagnosis not present

## 2016-05-02 MED FILL — CARVEDILOL 12.5 MG TABLET: 12.5 | 30 days supply | Qty: 60 | Fill #4

## 2016-05-07 MED FILL — ATORVASTATIN 20 MG TABLET: 20 | 90 days supply | Qty: 90 | Fill #1

## 2016-06-09 MED FILL — CARVEDILOL 12.5 MG TABLET: 12.5 | 30 days supply | Qty: 60 | Fill #5

## 2016-06-10 DIAGNOSIS — Z6828 Body mass index (BMI) 28.0-28.9, adult: Secondary | ICD-10-CM | POA: Diagnosis not present

## 2016-06-10 DIAGNOSIS — R05 Cough: Secondary | ICD-10-CM | POA: Diagnosis not present

## 2016-06-10 MED FILL — FLUTICASONE PROP 50 MCG SPR: 50 | 30 days supply | Qty: 16 | Fill #0

## 2016-06-10 MED FILL — HYDROCODONE-HOMATROPINE SYR: 5-1.5 | 6 days supply | Qty: 120 | Fill #0

## 2016-06-23 DIAGNOSIS — M5442 Lumbago with sciatica, left side: Secondary | ICD-10-CM | POA: Diagnosis not present

## 2016-06-23 DIAGNOSIS — Z9889 Other specified postprocedural states: Secondary | ICD-10-CM | POA: Diagnosis not present

## 2016-06-23 DIAGNOSIS — Z4789 Encounter for other orthopedic aftercare: Secondary | ICD-10-CM | POA: Diagnosis not present

## 2016-06-23 MED FILL — MINIVELLE 0.1 MG PATCH: 0.1 | 28 days supply | Qty: 8 | Fill #0

## 2016-06-23 MED FILL — VENTOLIN HFA 90 MCG INHALER: 108 (90 BAS | 25 days supply | Qty: 18 | Fill #0

## 2016-06-23 MED FILL — predniSONE 5 MG TABS: 5 | 6 days supply | Qty: 21 | Fill #0

## 2016-06-23 MED FILL — BENZONATATE 100 MG CAPSULE: 100 | 30 days supply | Qty: 90 | Fill #0

## 2016-06-24 MED FILL — HYDROCODONE-HOMATROPINE SYR: 5-1.5 | 8 days supply | Qty: 150 | Fill #0

## 2016-07-03 ENCOUNTER — Emergency Department (HOSPITAL_COMMUNITY)
Admission: EM | Admit: 2016-07-03 | Discharge: 2016-07-03 | Disposition: A | Discharge status: Home / Self Care | Payer: 59 | Attending: Emergency Medicine | Admitting: Emergency Medicine

## 2016-07-03 ENCOUNTER — Encounter (HOSPITAL_COMMUNITY): Payer: Self-pay | Admitting: Emergency Medicine

## 2016-07-03 DIAGNOSIS — Z955 Presence of coronary angioplasty implant and graft: Secondary | ICD-10-CM | POA: Insufficient documentation

## 2016-07-03 DIAGNOSIS — Z7982 Long term (current) use of aspirin: Secondary | ICD-10-CM | POA: Diagnosis not present

## 2016-07-03 DIAGNOSIS — M5441 Lumbago with sciatica, right side: Secondary | ICD-10-CM | POA: Insufficient documentation

## 2016-07-03 DIAGNOSIS — M545 Low back pain: Secondary | ICD-10-CM | POA: Diagnosis not present

## 2016-07-03 DIAGNOSIS — M5442 Lumbago with sciatica, left side: Secondary | ICD-10-CM | POA: Insufficient documentation

## 2016-07-03 DIAGNOSIS — I10 Essential (primary) hypertension: Secondary | ICD-10-CM | POA: Insufficient documentation

## 2016-07-03 MED ORDER — METHOCARBAMOL 500 MG PO TABS: 1000.0000 mg | ORAL_TABLET | Freq: Four times a day (QID) | ORAL | 0 refills | Status: DC

## 2016-07-03 MED ORDER — KETOROLAC TROMETHAMINE 60 MG/2ML IM SOLN
60.0000 mg | Freq: Once | INTRAMUSCULAR | Status: AC
Start: 2016-07-03 — End: 2016-07-03
  Administered 2016-07-03: 60 mg via INTRAMUSCULAR
  Filled 2016-07-03: qty 2

## 2016-07-03 NOTE — ED Triage Notes (Signed)
Pt reports she was bending over to get something and began having sharp pain in her right lumbar region traveling down her leg. Pt reports recent back surgery in march and states she has been having residual pain from her surgery. Pt tearful during triage

## 2016-07-03 NOTE — ED Notes (Signed)
Patient up ambulatory without any difficulty or distress to the bathroom at this time

## 2016-07-03 NOTE — ED Notes (Signed)
PA Geiple at bedside.  

## 2016-07-03 NOTE — ED Provider Notes (Signed)
[facility] DEPT Provider Note   CSN: [id number] Arrival date & time: 07/03/16  0825     History   Chief Complaint Chief Complaint  Patient presents with  . Back Pain    HPI [name] is a 56 y.o. female.  Patient with history of spinal stenosis, surgery in 12/2015 -- presents with complaint of lower back pain with radiation to her bilateral legs and buttocks. 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 She bent over today to pick up a tube. Shortly after she began having worsening severe back pain which cause her to have nausea and feel lightheaded. She was not able to walk without significant discomfort. She was placed in a wheelchair and brought to the emergency department. Patient is scheduled to meet with Dr. Nelva Bush to discuss additional pain control etiologies. Patient has had some weakness in her legs however is gradually improving. Patient otherwise denies warning symptoms of back pain including: fecal incontinence, urinary retention or overflow incontinence, night sweats, waking from sleep with back pain, unexplained fevers or weight loss, h/o cancer, IVDU, recent trauma.       Past Medical History:  Diagnosis Date  . Back pain    related to spinal stenosis and disc problem, radiates down left buttocks to leg., weakness occ.  [name] Hypertension   . PONV (postoperative nausea and vomiting)   . Vaginal foreign object    "Uses Femring"    Patient Active Problem List   Diagnosis Date Noted  . Spinal stenosis of lumbar region 12/28/2015  . Chest pain 04/16/2015    Past Surgical History:  Procedure Laterality Date  . ABDOMINAL HYSTERECTOMY    . CARDIAC CATHETERIZATION N/A 04/18/2015   Procedure: Left Heart Cath and Coronary Angiography;  Surgeon: [name], MD;  Location: [place] [facility];  Service: Cardiovascular;  Laterality: N/A;  . FOOT SURGERY Bilateral    [place] "bunion,bone spur, tendon" (1) -6'16, (1)-10'16  . LUMBAR LAMINECTOMY/DECOMPRESSION  MICRODISCECTOMY Bilateral 12/28/2015   Procedure: MICRO LUMBAR DECOMPRESSION L4 - L5 BILATERALLY;  Surgeon: [name], MD;  Location: [facility];  Service: Orthopedics;  Laterality: Bilateral;  . WISDOM TOOTH EXTRACTION      OB History    No data available       Home Medications    Prior to Admission medications   Medication Sig Start Date End Date Taking? Authorizing Provider  aspirin 81 MG EC tablet Take 1 tablet (81 mg total) by mouth daily. Resume 4 days post-op 12/28/15  Yes Cecilie Kicks, PA-C  methocarbamol (ROBAXIN) 500 MG tablet Take 1 tablet (500 mg total) by mouth every 8 (eight) hours as needed for muscle spasms. 12/28/15  Yes [name], MD  metoprolol succinate (TOPROL XL) 50 MG 24 hr tablet Take 1 tablet (50 mg total) by mouth daily. Take with or immediately following a meal. 04/19/15  Yes [name], MD  atorvastatin (LIPITOR) 20 MG tablet Take 20 mg by mouth daily at 6 PM.    Historical Provider, MD  carvedilol (COREG) 12.5 MG tablet Take 12.5 mg by mouth 2 (two) times daily. 05/02/16   Historical Provider, MD  Cholecalciferol (VITAMIN D PO) Take 1 tablet by mouth daily.    Historical Provider, MD  docusate sodium (COLACE) 100 MG capsule Take 1 capsule (100 mg total) by mouth 2 (two) times daily as needed for mild constipation. Patient not taking: Reported on 07/03/2016 12/28/15   [name], MD  Estradiol Acetate ([place]) 0.05 MG/24HR RING Place 1  each vaginally every 3 (three) months.     Historical Provider, MD  MINIVELLE 0.1 MG/24HR patch Place 0.1 mg onto the skin 2 (two) times a week. Monday, Thursday 05/23/16   Historical Provider, MD  nitroGLYCERIN (NITROSTAT) 0.4 MG SL tablet Place 1 tablet (0.4 mg total) under the tongue every 5 (five) minutes x 3 doses as needed for chest pain. 04/19/15   [name], MD  ondansetron (ZOFRAN) 8 MG tablet Take 1 tablet (8 mg total) by mouth every 8 (eight) hours as needed for nausea or vomiting. 12/29/15   [name], MD    oxyCODONE-acetaminophen (PERCOCET) 7.5-325 MG tablet Take 1 tablet by mouth every 4 (four) hours as needed. 12/28/15   [name], MD    Family History No family history on file.  Social History Social History  Substance Use Topics  . Smoking status: Never Smoker  . Smokeless tobacco: Not on file  . Alcohol use No     Allergies   Penicillins and Anesthetics, amide   Review of Systems Review of Systems  Constitutional: Negative for fever and unexpected weight change.  Gastrointestinal: Negative for constipation.       Negative for fecal incontinence.   Genitourinary: Negative for dysuria, flank pain and hematuria.       Negative for urinary incontinence or retention.  Musculoskeletal: Positive for back pain.  Neurological: Negative for weakness and numbness.       Denies saddle paresthesias.     Physical Exam Updated Vital Signs BP 170/93   Pulse 77   Temp 98.1 F (36.7 C) (Oral)   Resp 16   Ht 5\' 5"  (1.651 m)   Wt 77.1 kg   SpO2 100%   BMI 28.29 kg/m   Physical Exam  Constitutional: She appears well-developed and well-nourished.  HENT:  Head: Normocephalic and atraumatic.  Eyes: Conjunctivae are normal.  Neck: Normal range of motion. Neck supple.  Pulmonary/Chest: Effort normal.  Abdominal: Soft. There is no tenderness. There is no CVA tenderness.  Musculoskeletal: Normal range of motion.       Cervical back: Normal.       Thoracic back: Normal.       Lumbar back: She exhibits tenderness. She exhibits no bony tenderness.  No step-off noted with palpation of spine. Exam is difficult because patient has severe pain with any movement from her seated position currently. Patient has generalized tenderness across her bilateral lower back.  Neurological: She is alert. She has normal strength and normal reflexes. No sensory deficit.  5/5 strength in entire lower extremities bilaterally. No sensation deficit.   Skin: Skin is warm and dry. No rash noted.   Psychiatric: She has a normal mood and affect.  Nursing note and vitals reviewed.    ED Treatments / Results  Procedures Procedures (including critical care time)  Medications Ordered in ED Medications  ketorolac (TORADOL) injection 60 mg (60 mg Intramuscular Given 07/03/16 0935)     Initial Impression / Assessment and Plan / ED Course  I have reviewed the triage vital signs and the nursing notes.  Pertinent labs & imaging results that were available during my care of the patient were reviewed by me and considered in my medical decision making (see chart for details).  Clinical Course   Patient seen and examined. Patient states that she hopes that she can go back to work today. I have ordered IM Toradol. Will reassess. No red flags. May need stronger medication if not improved, but this would  stop her from working today.   Vital signs reviewed and are as follows: BP 170/93   Pulse 77   Temp 98.1 F (36.7 C) (Oral)   Resp 16   Ht 5\' 5"  (1.651 m)   Wt 77.1 kg   SpO2 100%   BMI 28.29 kg/m   10:56 AM patient feeling much improved. She has been up to the bathroom. She will attempt to go back to work today but states that she will leave if she has significant pain. Will discharge to home with Robaxin. Encouraged PCP/orthopedic follow-up.  Patient was counseled on back pain precautions and told to do activity as tolerated but do not lift, push, or pull heavy objects more than 10 pounds for the next week.  Patient counseled to use ice or heat on back for no longer than 15 minutes every hour.   Patient prescribed muscle relaxer and counseled on proper use of muscle relaxant medication.    Urged patient not to drink alcohol, drive, or perform any other activities that requires focus while taking muscle relaxer.   Patient urged to follow-up with PCP if pain does not improve with treatment and rest or if pain becomes recurrent. Urged to return with worsening severe pain, loss of bowel  or bladder control, trouble walking.   The patient verbalizes understanding and agrees with the plan.   Final Clinical Impressions(s) / ED Diagnoses   Final diagnoses:  Bilateral low back pain with sciatica, sciatica laterality unspecified   Patient with back pain, History of spinal stenosis and surgery, no red flags today. No neurological deficits. Patient is ambulatory. No warning symptoms of back pain including: fecal incontinence, urinary retention or overflow incontinence, night sweats, waking from sleep with back pain, unexplained fevers or weight loss, h/o cancer, IVDU, recent trauma. No concern for cauda equina, epidural abscess, or other serious cause of back pain. Conservative measures such as rest, ice/heat and pain medicine indicated with PCP follow-up if no improvement with conservative management.    New Prescriptions New Prescriptions   METHOCARBAMOL (ROBAXIN) 500 MG TABLET    Take 2 tablets (1,000 mg total) by mouth 4 (four) times daily.     Carlisle Cater, PA-C 07/03/16 [place], MD 07/09/16 1545

## 2016-07-03 NOTE — ED Notes (Signed)
Patient undressed, in gown, on continuous pulse oximetry and blood pressure cuff; pillow placed underneath her legs for comfort

## 2016-07-03 NOTE — Discharge Instructions (Signed)
Please read and follow all provided instructions.  Your diagnoses today include:  1. Bilateral low back pain with sciatica, sciatica laterality unspecified     Tests performed today include:  Vital signs - see below for your results today  Medications prescribed:   Robaxin (methocarbamol) - muscle relaxer medication  DO NOT drive or perform any activities that require you to be awake and alert because this medicine can make you drowsy.   Take any prescribed medications only as directed.  Home care instructions:   Follow any educational materials contained in this packet  Please rest, use ice or heat on your back for the next several days  Do not lift, push, pull anything more than 10 pounds for the next week  Follow-up instructions: Please follow-up with your primary care provider or your orthopedic surgeon  in the next 1 week for further evaluation of your symptoms.   Return instructions:  SEEK IMMEDIATE MEDICAL ATTENTION IF YOU HAVE:  New numbness, tingling, weakness, or problem with the use of your arms or legs  Severe back pain not relieved with medications  Loss control of your bowels or bladder  Increasing pain in any areas of the body (such as chest or abdominal pain)  Shortness of breath, dizziness, or fainting.   Worsening nausea (feeling sick to your stomach), vomiting, fever, or sweats  Any other emergent concerns regarding your health   Additional Information:  Your vital signs today were: BP 132/73 (BP Location: Right Arm)    Pulse 69    Temp 98.1 F (36.7 C) (Oral)    Resp 18    Ht 5\' 5"  (1.651 m)    Wt 77.1 kg    SpO2 100%    BMI 28.29 kg/m  If your blood pressure (BP) was elevated above 135/85 this visit, please have this repeated by your doctor within one month. --------------

## 2016-07-03 NOTE — ED Notes (Signed)
Patient's legs elevated with pillows for comfort. Pt reports some improvement in pain with leg elevation.

## 2016-07-17 DIAGNOSIS — R05 Cough: Secondary | ICD-10-CM | POA: Diagnosis not present

## 2016-07-17 DIAGNOSIS — I10 Essential (primary) hypertension: Secondary | ICD-10-CM | POA: Diagnosis not present

## 2016-07-17 DIAGNOSIS — Z6828 Body mass index (BMI) 28.0-28.9, adult: Secondary | ICD-10-CM | POA: Diagnosis not present

## 2016-07-17 DIAGNOSIS — K219 Gastro-esophageal reflux disease without esophagitis: Secondary | ICD-10-CM | POA: Diagnosis not present

## 2016-07-17 MED FILL — HYDROCODONE-HOMATROPINE SYR: 5-1.5 | 8 days supply | Qty: 150 | Fill #0

## 2016-07-17 MED FILL — AZITHROMYCIN 250 MG TABLET: 250 | 5 days supply | Qty: 6 | Fill #0

## 2016-07-17 MED FILL — predniSONE 10 MG TABS: 10 | 9 days supply | Qty: 21 | Fill #0

## 2016-07-22 MED FILL — CARVEDILOL 12.5 MG TABLET: 12.5 | 90 days supply | Qty: 180 | Fill #0

## 2016-07-23 DIAGNOSIS — M961 Postlaminectomy syndrome, not elsewhere classified: Secondary | ICD-10-CM | POA: Diagnosis not present

## 2016-07-23 DIAGNOSIS — M5136 Other intervertebral disc degeneration, lumbar region: Secondary | ICD-10-CM | POA: Diagnosis not present

## 2016-07-24 MED FILL — MINIVELLE 0.1 MG PATCH: 0.1 | 28 days supply | Qty: 8 | Fill #1

## 2016-07-24 MED FILL — LORazepam 0.5 MG TABS: 0.5 | 15 days supply | Qty: 15 | Fill #0

## 2016-08-26 MED FILL — MINIVELLE 0.1 MG PATCH: 0.1 | 28 days supply | Qty: 8 | Fill #2

## 2016-08-26 MED FILL — ATORVASTATIN 20 MG TABLET: 20 | 90 days supply | Qty: 90 | Fill #2

## 2016-08-29 DIAGNOSIS — M5442 Lumbago with sciatica, left side: Secondary | ICD-10-CM | POA: Diagnosis not present

## 2016-08-29 DIAGNOSIS — M545 Low back pain: Secondary | ICD-10-CM | POA: Diagnosis not present

## 2016-08-29 DIAGNOSIS — M5117 Intervertebral disc disorders with radiculopathy, lumbosacral region: Secondary | ICD-10-CM | POA: Diagnosis not present

## 2016-08-29 DIAGNOSIS — M5136 Other intervertebral disc degeneration, lumbar region: Secondary | ICD-10-CM | POA: Diagnosis not present

## 2016-09-11 DIAGNOSIS — M5136 Other intervertebral disc degeneration, lumbar region: Secondary | ICD-10-CM | POA: Diagnosis not present

## 2016-09-11 DIAGNOSIS — M961 Postlaminectomy syndrome, not elsewhere classified: Secondary | ICD-10-CM | POA: Diagnosis not present

## 2016-09-22 MED FILL — MINIVELLE 0.1 MG PATCH: 0.1 | 28 days supply | Qty: 8 | Fill #3

## 2016-10-24 MED FILL — MINIVELLE 0.1 MG PATCH: 0.1 | 28 days supply | Qty: 8 | Fill #4

## 2016-10-27 HISTORY — PX: COLONOSCOPY W/ BIOPSIES AND POLYPECTOMY: SHX1376

## 2016-11-17 MED FILL — CARVEDILOL 12.5 MG TABLET: 12.5 | 90 days supply | Qty: 180 | Fill #1

## 2016-11-20 MED FILL — ESTRADIOL 0.1 MG PATCH: 0.1 | 28 days supply | Qty: 8 | Fill #0

## 2016-12-01 ENCOUNTER — Other Ambulatory Visit: Payer: Self-pay | Admitting: Obstetrics and Gynecology

## 2016-12-01 DIAGNOSIS — Z1231 Encounter for screening mammogram for malignant neoplasm of breast: Secondary | ICD-10-CM

## 2016-12-08 ENCOUNTER — Ambulatory Visit
Admission: RE | Admit: 2016-12-08 | Discharge: 2016-12-08 | Disposition: A | Discharge status: Home / Self Care | Payer: 59 | Source: Ambulatory Visit | Attending: Obstetrics and Gynecology | Admitting: Obstetrics and Gynecology

## 2016-12-08 DIAGNOSIS — Z1231 Encounter for screening mammogram for malignant neoplasm of breast: Secondary | ICD-10-CM | POA: Diagnosis not present

## 2017-01-01 MED FILL — ESTRADIOL 0.1 MG PATCH: 0.1 | 28 days supply | Qty: 8 | Fill #1

## 2017-02-11 DIAGNOSIS — Z0000 Encounter for general adult medical examination without abnormal findings: Secondary | ICD-10-CM | POA: Diagnosis not present

## 2017-02-13 DIAGNOSIS — Z0000 Encounter for general adult medical examination without abnormal findings: Secondary | ICD-10-CM | POA: Diagnosis not present

## 2017-02-13 DIAGNOSIS — Z1389 Encounter for screening for other disorder: Secondary | ICD-10-CM | POA: Diagnosis not present

## 2017-02-13 DIAGNOSIS — I10 Essential (primary) hypertension: Secondary | ICD-10-CM | POA: Diagnosis not present

## 2017-02-13 DIAGNOSIS — K219 Gastro-esophageal reflux disease without esophagitis: Secondary | ICD-10-CM | POA: Diagnosis not present

## 2017-02-13 DIAGNOSIS — M5416 Radiculopathy, lumbar region: Secondary | ICD-10-CM | POA: Diagnosis not present

## 2017-02-13 DIAGNOSIS — E559 Vitamin D deficiency, unspecified: Secondary | ICD-10-CM | POA: Diagnosis not present

## 2017-02-13 DIAGNOSIS — Z6826 Body mass index (BMI) 26.0-26.9, adult: Secondary | ICD-10-CM | POA: Diagnosis not present

## 2017-02-13 DIAGNOSIS — E784 Other hyperlipidemia: Secondary | ICD-10-CM | POA: Diagnosis not present

## 2017-02-13 DIAGNOSIS — L308 Other specified dermatitis: Secondary | ICD-10-CM | POA: Diagnosis not present

## 2017-02-13 MED FILL — NAPROXEN 500 MG TABLET: 500 | 30 days supply | Qty: 30 | Fill #0

## 2017-02-13 MED FILL — ATORVASTATIN 20 MG TABLET: 20 | 90 days supply | Qty: 90 | Fill #0

## 2017-02-13 MED FILL — CARVEDILOL 12.5 MG TABLET: 12.5 | 90 days supply | Qty: 180 | Fill #0

## 2017-04-16 DIAGNOSIS — K219 Gastro-esophageal reflux disease without esophagitis: Secondary | ICD-10-CM | POA: Diagnosis not present

## 2017-04-16 DIAGNOSIS — R05 Cough: Secondary | ICD-10-CM | POA: Diagnosis not present

## 2017-04-16 DIAGNOSIS — Z6824 Body mass index (BMI) 24.0-24.9, adult: Secondary | ICD-10-CM | POA: Diagnosis not present

## 2017-04-16 DIAGNOSIS — J209 Acute bronchitis, unspecified: Secondary | ICD-10-CM | POA: Diagnosis not present

## 2017-04-16 DIAGNOSIS — I10 Essential (primary) hypertension: Secondary | ICD-10-CM | POA: Diagnosis not present

## 2017-04-16 MED FILL — AZITHROMYCIN 250 MG TABLET: 250 | 5 days supply | Qty: 6 | Fill #0

## 2017-04-16 MED FILL — predniSONE 10 MG TABS: 10 | 3 days supply | Qty: 6 | Fill #0

## 2017-04-16 MED FILL — HYDROCODONE-HOMATROPINE SYR: 5-1.5 | 12 days supply | Qty: 120 | Fill #0

## 2017-05-13 MED FILL — LORazepam 0.5 MG TABS: 0.5 | 15 days supply | Qty: 15 | Fill #0

## 2017-06-01 DIAGNOSIS — Z6824 Body mass index (BMI) 24.0-24.9, adult: Secondary | ICD-10-CM | POA: Diagnosis not present

## 2017-06-01 DIAGNOSIS — Z01419 Encounter for gynecological examination (general) (routine) without abnormal findings: Secondary | ICD-10-CM | POA: Diagnosis not present

## 2017-06-30 MED FILL — ATORVASTATIN 20 MG TABLET: 20 | 90 days supply | Qty: 90 | Fill #0

## 2017-06-30 MED FILL — CARVEDILOL 12.5 MG TABLET: 12.5 | 90 days supply | Qty: 180 | Fill #0

## 2017-07-02 ENCOUNTER — Telehealth: Payer: Self-pay | Admitting: Gastroenterology

## 2017-07-02 NOTE — Telephone Encounter (Signed)
I have reviewed the records. For documentation purposes:  27mm tubular adenoma and 8mm hyperplastic polyp in December 2012.  So it is time for a colonoscopy.  Please give this patient an appointment with an [facility] nurse to schedule a surveillance colonoscopy. Dx: history of colon polyps

## 2017-07-02 NOTE — Telephone Encounter (Signed)
Received referral for patient to have colonoscopy. Last colon 2012 with [facility]. Records faxed with referral. Records placed on DOD for 9.6.18 am; Dr.Danis desk for review.

## 2017-07-03 ENCOUNTER — Encounter: Payer: Self-pay | Admitting: Gastroenterology

## 2017-07-03 NOTE — Telephone Encounter (Signed)
Patient scheduled for 09/04/2017

## 2017-07-09 ENCOUNTER — Emergency Department (HOSPITAL_COMMUNITY): Payer: 59

## 2017-07-09 ENCOUNTER — Emergency Department (HOSPITAL_COMMUNITY)
Admission: EM | Admit: 2017-07-09 | Discharge: 2017-07-09 | Disposition: A | Discharge status: Home / Self Care | Payer: 59 | Attending: Emergency Medicine | Admitting: Emergency Medicine

## 2017-07-09 ENCOUNTER — Encounter (HOSPITAL_COMMUNITY): Payer: Self-pay

## 2017-07-09 DIAGNOSIS — I10 Essential (primary) hypertension: Secondary | ICD-10-CM | POA: Diagnosis not present

## 2017-07-09 DIAGNOSIS — R0789 Other chest pain: Secondary | ICD-10-CM | POA: Insufficient documentation

## 2017-07-09 DIAGNOSIS — R079 Chest pain, unspecified: Secondary | ICD-10-CM | POA: Diagnosis not present

## 2017-07-09 DIAGNOSIS — F419 Anxiety disorder, unspecified: Secondary | ICD-10-CM | POA: Insufficient documentation

## 2017-07-09 DIAGNOSIS — Z79899 Other long term (current) drug therapy: Secondary | ICD-10-CM | POA: Diagnosis not present

## 2017-07-09 DIAGNOSIS — Z91040 Latex allergy status: Secondary | ICD-10-CM | POA: Diagnosis not present

## 2017-07-09 LAB — CBC
HEMATOCRIT: 37.5 % (ref 36.0–46.0)
HEMOGLOBIN: 12.6 g/dL (ref 12.0–15.0)
MCH: 30 pg (ref 26.0–34.0)
MCHC: 33.6 g/dL (ref 30.0–36.0)
MCV: 89.3 fL (ref 78.0–100.0)
Platelets: 266 10*3/uL (ref 150–400)
RBC: 4.2 MIL/uL (ref 3.87–5.11)
RDW: 13.3 % (ref 11.5–15.5)
WBC: 4.5 10*3/uL (ref 4.0–10.5)

## 2017-07-09 LAB — BASIC METABOLIC PANEL
Anion gap: 5 (ref 5–15)
BUN: 16 mg/dL (ref 6–20)
CHLORIDE: 106 mmol/L (ref 101–111)
CO2: 26 mmol/L (ref 22–32)
Calcium: 8.9 mg/dL (ref 8.9–10.3)
Creatinine, Ser: 1.13 mg/dL — ABNORMAL HIGH (ref 0.44–1.00)
GFR calc Af Amer: >60 mL/min (ref >60)
GFR calc non Af Amer: 54 mL/min — ABNORMAL LOW (ref >60)
GLUCOSE: 83 mg/dL (ref 65–99)
POTASSIUM: 3.6 mmol/L (ref 3.5–5.1)
Sodium: 137 mmol/L (ref 135–145)

## 2017-07-09 LAB — I-STAT TROPONIN, ED: Troponin i, poc: 0 ng/mL (ref 0.00–0.08)

## 2017-07-09 MED ORDER — LORAZEPAM 0.5 MG PO TABS: 0.5000 mg | ORAL_TABLET | Freq: Three times a day (TID) | ORAL | 0 refills | Status: DC

## 2017-07-09 MED ORDER — LORAZEPAM 0.5 MG PO TABS
0.5000 mg | ORAL_TABLET | Freq: Once | ORAL | Status: AC
  Administered 2017-07-09: 0.5 mg via ORAL
  Filled 2017-07-09: qty 1

## 2017-07-09 MED FILL — LORazepam 0.5 MG TABS: 0.5 | 5 days supply | Qty: 15 | Fill #0

## 2017-07-09 NOTE — Discharge Instructions (Signed)
Return here as needed.  Follow-up with your primary doctor. °

## 2017-07-09 NOTE — ED Triage Notes (Signed)
Pt. Developed chest pain yesterday and has increasingly became worse.  She works in [employer] and today while working she became sob and the pain /numbnewss radiates into her lt. Jaw.  She is nauseated and lightheaded.  She reports that her brother was murdered 2 days ago and she is under increased stress.  Skin is warm and dry.  Pt. Is alert and oriented X3.  Neuro assessment negative.  Van negative.

## 2017-07-13 NOTE — ED Provider Notes (Signed)
[facility] DEPT Provider Note   CSN: [id number] Arrival date & time: 07/09/17  1001     History   Chief Complaint Chief Complaint  Patient presents with  . Chest Pain    HPI [name] is a 56 y.o. female.  HPI Patient presents to the emergency department with chest pain, arm tingling and extreme grief and anxiety.  The patient states that she has also had some nausea and lightheadedness.  Patient states that she has not been sleeping well over the last few days.  She states that her brother was murdered 3 nights ago.  He states she has been very upset with that and been very anxious and depressed and crying nonstop and not sleeping.  Patient states she did not take any medications for this.  Patient states she feels that all of her symptoms are associated with this traumatic event. The patient denies  shortness of breath, headache,blurred vision, neck pain, fever, cough, weakness, numbness, , anorexia, edema, abdominal pain, nausea, vomiting, diarrhea, rash, back pain, dysuria, hematemesis, bloody stool, near syncope, or syncope. Past Medical History:  Diagnosis Date  . Back pain    related to spinal stenosis and disc problem, radiates down left buttocks to leg., weakness occ.  [name] Hypertension   . PONV (postoperative nausea and vomiting)   . Vaginal foreign object    "Uses Femring"    Patient Active Problem List   Diagnosis Date Noted  . Spinal stenosis of lumbar region 12/28/2015  . Chest pain 04/16/2015    Past Surgical History:  Procedure Laterality Date  . ABDOMINAL HYSTERECTOMY    . CARDIAC CATHETERIZATION N/A 04/18/2015   Procedure: Left Heart Cath and Coronary Angiography;  Surgeon: [name], MD;  Location: [facility];  Service: Cardiovascular;  Laterality: N/A;  . FOOT SURGERY Bilateral    [place] "bunion,bone spur, tendon" (1) -6'16, (1)-10'16  . LUMBAR LAMINECTOMY/DECOMPRESSION MICRODISCECTOMY Bilateral 12/28/2015   Procedure:  MICRO LUMBAR DECOMPRESSION L4 - L5 BILATERALLY;  Surgeon: [name], MD;  Location: [facility];  Service: Orthopedics;  Laterality: Bilateral;  . WISDOM TOOTH EXTRACTION      OB History    No data available       Home Medications    Prior to Admission medications   Medication Sig Start Date End Date Taking? Authorizing Provider  atorvastatin (LIPITOR) 20 MG tablet Take 20 mg by mouth daily at 6 PM.   Yes [provider]  carvedilol (COREG) 12.5 MG tablet Take 12.5 mg by mouth 2 (two) times daily. 05/02/16  Yes [provider]  Cholecalciferol (VITAMIN D PO) Take 1 tablet by mouth daily.   Yes [provider]  nitroGLYCERIN (NITROSTAT) 0.4 MG SL tablet Place 1 tablet (0.4 mg total) under the tongue every 5 (five) minutes x 3 doses as needed for chest pain. 04/19/15  Yes [name], MD  aspirin 81 MG EC tablet Take 1 tablet (81 mg total) by mouth daily. Resume 4 days post-op Patient not taking: Reported on 07/09/2017 12/28/15   Cecilie Kicks, PA-C  LORazepam (ATIVAN) 0.5 MG tablet Take 1 tablet (0.5 mg total) by mouth every 8 (eight) hours. 07/09/17   Kha Hari, Harrell Gave, PA-C  methocarbamol (ROBAXIN) 500 MG tablet Take 2 tablets (1,000 mg total) by mouth 4 (four) times daily. Patient not taking: Reported on 07/09/2017 07/03/16   Carlisle Cater, PA-C  metoprolol succinate (TOPROL XL) 50 MG 24 hr tablet Take 1 tablet (50 mg total) by mouth  daily. Take with or immediately following a meal. Patient not taking: Reported on 07/09/2017 04/19/15   [name], MD  MINIVELLE 0.1 MG/24HR patch Place 0.1 mg onto the skin 2 (two) times a week. Monday, Thursday 05/23/16   [provider]  ondansetron (ZOFRAN) 8 MG tablet Take 1 tablet (8 mg total) by mouth every 8 (eight) hours as needed for nausea or vomiting. Patient not taking: Reported on 07/09/2017 12/29/15   [name], MD  oxyCODONE-acetaminophen (PERCOCET) 7.5-325 MG tablet Take 1 tablet by mouth every 4  (four) hours as needed. Patient not taking: Reported on 07/03/2016 12/28/15   [name], MD    Family History No family history on file.  Social History Social History  Substance Use Topics  . Smoking status: Never Smoker  . Smokeless tobacco: Never Used  . Alcohol use No     Allergies   Penicillins; Anesthetics, amide; and Latex   Review of Systems Review of Systems  All other systems negative except as documented in the HPI. All pertinent positives and negatives as reviewed in the HPI. Physical Exam Updated Vital Signs BP 135/82 (BP Location: Left Arm)   Pulse 78   Temp 98.2 F (36.8 C) (Oral)   Resp 16   Ht 5\' 5"  (1.651 m)   Wt 65.3 kg (144 lb)   SpO2 98%   BMI 23.96 kg/m   Physical Exam  Constitutional: She is oriented to person, place, and time. She appears well-developed and well-nourished. No distress.  HENT:  Head: Normocephalic and atraumatic.  Mouth/Throat: Oropharynx is clear and moist.  Eyes: Pupils are equal, round, and reactive to light.  Neck: Normal range of motion. Neck supple.  Cardiovascular: Normal rate, regular rhythm and normal heart sounds.  Exam reveals no gallop and no friction rub.   No murmur heard. Pulmonary/Chest: Effort normal and breath sounds normal. No respiratory distress. She has no wheezes.  Abdominal: Soft. Bowel sounds are normal. She exhibits no distension. There is no tenderness.  Neurological: She is alert and oriented to person, place, and time. She exhibits normal muscle tone. Coordination normal.  Skin: Skin is warm and dry. Capillary refill takes less than 2 seconds. No rash noted. No erythema.  Psychiatric: Her behavior is normal. Judgment and thought content normal. Her mood appears anxious. Cognition and memory are normal. She exhibits a depressed mood.  Nursing note and vitals reviewed.    ED Treatments / Results  Labs (all labs ordered are listed, but only abnormal results are displayed) Labs Reviewed  BASIC  METABOLIC PANEL - Abnormal; Notable for the following:       Result Value   Creatinine, Ser 1.13 (*)    GFR calc non Af Amer 54 (*)    All other components within normal limits  CBC  I-STAT TROPONIN, ED    EKG  EKG Interpretation  Date/Time:  Thursday July 09 2017 10:05:44 EDT Ventricular Rate:  67 PR Interval:  214 QRS Duration: 82 QT Interval:  404 QTC Calculation: 426 R Axis:   79 Text Interpretation:  Sinus rhythm with 1st degree A-V block Otherwise normal ECG No significant change since last tracing Confirmed by Thayer Jew [id number]) on 07/10/2017 3:35:08 PM       Radiology No results found.  Procedures Procedures (including critical care time)  Medications Ordered in ED Medications  LORazepam (ATIVAN) tablet 0.5 mg (0.5 mg Oral Given 07/09/17 1550)     Initial Impression / Assessment and Plan / ED Course  I have reviewed the triage vital signs and the nursing notes.  Pertinent labs & imaging results that were available during my care of the patient were reviewed by me and considered in my medical decision making (see chart for details).     I feel that the patient's symptoms are directly related to her recent traumatic event with her brother.  I feel that take of having effect of multiple issues of not sleeping with decreased reaction along with anxiety and stress and depression over the event.  Patient's chest pain has been constant and unrelenting.  Patient states the more upset and anxious.  She gets them or her chest is bothering her and did advise her to follow-up with her primary doctor or testing here today did not show any significant abnormalities  Final Clinical Impressions(s) / ED Diagnoses   Final diagnoses:  Atypical chest pain  Anxiety    New Prescriptions Discharge Medication List as of 07/09/2017  4:04 PM    START taking these medications   Details  LORazepam (ATIVAN) 0.5 MG tablet Take 1 tablet (0.5 mg total) by mouth every 8  (eight) hours., Starting Thu 07/09/2017, Print         Ashlie Mcmenamy, [place], PA-C 07/13/17 1420    Mesner, Corene Cornea, MD 07/13/17 1538

## 2017-08-10 ENCOUNTER — Ambulatory Visit (AMBULATORY_SURGERY_CENTER): Payer: Self-pay | Admitting: *Deleted

## 2017-08-10 VITALS — Ht 65.0 in | Wt 147.0 lb

## 2017-08-10 DIAGNOSIS — Z86010 Personal history of colonic polyps: Secondary | ICD-10-CM

## 2017-08-10 MED ORDER — NA SULFATE-K SULFATE-MG SULF 17.5-3.13-1.6 GM/177ML PO SOLN: ORAL | 0 refills | Status: DC

## 2017-08-10 NOTE — Progress Notes (Signed)
Patient denies any allergies to eggs or soy. Patient denies any problems with anesthesia/sedation. Patient denies any oxygen use at home. Patient denies taking any diet/weight loss medications or blood thinners. EMMI education assisgned to patient on colonoscopy, this was explained and instructions given to patient. 

## 2017-08-21 ENCOUNTER — Encounter: Payer: Self-pay | Admitting: Gastroenterology

## 2017-09-04 ENCOUNTER — Other Ambulatory Visit: Payer: Self-pay

## 2017-09-04 ENCOUNTER — Ambulatory Visit (AMBULATORY_SURGERY_CENTER): Payer: 59 | Admitting: Gastroenterology

## 2017-09-04 ENCOUNTER — Encounter: Payer: Self-pay | Admitting: Gastroenterology

## 2017-09-04 VITALS — BP 110/55 | HR 59 | Temp 97.1°F | Resp 16 | Ht 65.0 in | Wt 147.0 lb

## 2017-09-04 DIAGNOSIS — D122 Benign neoplasm of ascending colon: Secondary | ICD-10-CM | POA: Diagnosis not present

## 2017-09-04 DIAGNOSIS — Z1211 Encounter for screening for malignant neoplasm of colon: Secondary | ICD-10-CM | POA: Diagnosis not present

## 2017-09-04 DIAGNOSIS — Z86010 Personal history of colonic polyps: Secondary | ICD-10-CM

## 2017-09-04 MED ORDER — SODIUM CHLORIDE 0.9 % IV SOLN: 500.0000 mL | INTRAVENOUS | Status: DC

## 2017-09-04 NOTE — Patient Instructions (Signed)
Discharge instructions given. Handout on polyps. Resume previous medications. YOU HAD AN ENDOSCOPIC PROCEDURE TODAY AT THE [facility]:   Refer to the procedure report that was given to you for any specific questions about what was found during the examination.  If the procedure report does not answer your questions, please call your gastroenterologist to clarify.  If you requested that your care partner not be given the details of your procedure findings, then the procedure report has been included in a sealed envelope for you to review at your convenience later.  YOU SHOULD EXPECT: Some feelings of bloating in the abdomen. Passage of more gas than usual.  Walking can help get rid of the air that was put into your GI tract during the procedure and reduce the bloating. If you had a lower endoscopy (such as a colonoscopy or flexible sigmoidoscopy) you may notice spotting of blood in your stool or on the toilet paper. If you underwent a bowel prep for your procedure, you may not have a normal bowel movement for a few days.  Please Note:  You might notice some irritation and congestion in your nose or some drainage.  This is from the oxygen used during your procedure.  There is no need for concern and it should clear up in a day or so.  SYMPTOMS TO REPORT IMMEDIATELY:   Following lower endoscopy (colonoscopy or flexible sigmoidoscopy):  Excessive amounts of blood in the stool  Significant tenderness or worsening of abdominal pains  Swelling of the abdomen that is new, acute  Fever of 100F or higher   For urgent or emergent issues, a gastroenterologist can be reached at any hour by calling [phone].   DIET:  We do recommend a small meal at first, but then you may proceed to your regular diet.  Drink plenty of fluids but you should avoid alcoholic beverages for 24 hours.  ACTIVITY:  You should plan to take it easy for the rest of today and you should NOT DRIVE or use heavy  machinery until tomorrow (because of the sedation medicines used during the test).    FOLLOW UP: Our staff will call the number listed on your records the next business day following your procedure to check on you and address any questions or concerns that you may have regarding the information given to you following your procedure. If we do not reach you, we will leave a message.  However, if you are feeling well and you are not experiencing any problems, there is no need to return our call.  We will assume that you have returned to your regular daily activities without incident.  If any biopsies were taken you will be contacted by phone or by letter within the next 1-3 weeks.  Please call us at [phone] if you have not heard about the biopsies in 3 weeks.    SIGNATURES/CONFIDENTIALITY: You and/or your care partner have signed paperwork which will be entered into your electronic medical record.  These signatures attest to the fact that that the information above on your After Visit Summary has been reviewed and is understood.  Full responsibility of the confidentiality of this discharge information lies with you and/or your care-partner. 

## 2017-09-04 NOTE — Progress Notes (Signed)
Called to room to assist during endoscopic procedure.  Patient ID and intended procedure confirmed with present staff. Received instructions for my participation in the procedure from the performing physician.  

## 2017-09-04 NOTE — Op Note (Signed)
[name] Patient Name: [name] Procedure Date: 09/04/2017 9:57 AM MRN: [id number] Endoscopist: Boyle. Loletha [name] , MD Age: 56 Referring MD:  Date of Birth: Mar 01, 1961 Gender: Female Account #: [id number] Procedure:                Colonoscopy Indications:              Surveillance: Personal history of adenomatous                            polyps on last colonoscopy > 5 years ago (39mm TA                            09/2011) Medicines:                Monitored Anesthesia Care Procedure:                Pre-Anesthesia Assessment:                           - Prior to the procedure, a History and Physical                            was performed, and patient medications and                            allergies were reviewed. The patient's tolerance of                            previous anesthesia was also reviewed. The risks                            and benefits of the procedure and the sedation                            options and risks were discussed with the patient.                            All questions were answered, and informed consent                            was obtained. Prior Anticoagulants: The patient has                            taken no previous anticoagulant or antiplatelet                            agents. ASA Grade Assessment: II - A patient with                            mild systemic disease. After reviewing the risks                            and benefits, the patient was deemed in  satisfactory condition to undergo the procedure.                           After obtaining informed consent, the colonoscope                            was passed under direct vision. Throughout the                            procedure, the patient's blood pressure, pulse, and                            oxygen saturations were monitored continuously. The                            Model CF-HQ190L [id number]) scope was introduced                             through the anus and advanced to the the cecum,                            identified by appendiceal orifice and ileocecal                            valve. The colonoscopy was performed without                            difficulty. The patient tolerated the procedure                            well. The quality of the bowel preparation was                            excellent. The ileocecal valve, appendiceal                            orifice, and rectum were photographed. The quality                            of the bowel preparation was evaluated using the                            BBPS [facility] Bowel Preparation Scale) with scores                            of: Right Colon = 3, Transverse Colon = 3 and Left                            Colon = 3 (entire mucosa seen well with no residual                            staining, small fragments of stool or opaque  liquid). The total BBPS score equals 9. The bowel                            preparation used was SUPREP. Scope In: 10:14:29 AM Scope Out: 10:29:51 AM Scope Withdrawal Time: 0 hours 10 minutes 27 seconds  Total Procedure Duration: 0 hours 15 minutes 22 seconds  Findings:                 The perianal and digital rectal examinations were                            normal.                           A 2 mm polyp was found in the ascending colon. The                            polyp was sessile. The polyp was removed with a                            cold biopsy forceps. Resection and retrieval were                            complete.                           An area of melanosis was found in the entire colon.                           The exam was otherwise without abnormality on                            direct and retroflexion views. Complications:            No immediate complications. Estimated Blood Loss:     Estimated blood loss: none. Impression:               - One 2  mm polyp in the ascending colon, removed                            with a cold biopsy forceps. Resected and retrieved.                           - Melanosis in the colon.                           - The examination was otherwise normal on direct                            and retroflexion views. Recommendation:           - Patient has a contact number available for                            emergencies. The signs and symptoms of potential  delayed complications were discussed with the                            patient. Return to normal activities tomorrow.                            Written discharge instructions were provided to the                            patient.                           - Resume previous diet.                           - Continue present medications.                           - Await pathology results.                           - Repeat colonoscopy is recommended for                            surveillance. The colonoscopy date will be                            determined after pathology results from today's                            exam become available for review. Cheryl Soyars L. Loletha Carrow, MD 09/04/2017 10:35:17 AM This report has been signed electronically.

## 2017-09-04 NOTE — Progress Notes (Signed)
Pt. Reports no change in her medical or surgical history since her pre-visit 08/10/2017.

## 2017-09-04 NOTE — Progress Notes (Signed)
A and O x3. Report to RN. Tolerated MAC anesthesia well.

## 2017-09-07 ENCOUNTER — Telehealth: Payer: Self-pay

## 2017-09-07 NOTE — Telephone Encounter (Signed)
  Follow up Call-  Call back number 09/04/2017  Post procedure Call Back phone  # [phone]  Permission to leave phone message No  Some recent data might be hidden     Patient questions:  Do you have a fever, pain , or abdominal swelling? No. Pain Score  0 *  Have you tolerated food without any problems? Yes.    Have you been able to return to your normal activities? No.  Do you have any questions about your discharge instructions: Diet   No. Medications  No. Follow up visit  No.  Do you have questions or concerns about your Care? No.  Actions: * If pain score is 4 or above: No action needed, pain <4.

## 2017-09-10 ENCOUNTER — Encounter: Payer: Self-pay | Admitting: Gastroenterology

## 2017-09-22 ENCOUNTER — Telehealth: Payer: Self-pay | Admitting: Gastroenterology

## 2017-09-22 NOTE — Telephone Encounter (Signed)
Office visit is the best plan. I will review all of her pathology results then.

## 2017-09-22 NOTE — Telephone Encounter (Signed)
Reviewed patient's path results, has not received letter yet. Mailed another copy to her. Patient states she is having problems with some rectal bleeding, and occasionally having a hard time completely evacuating her bowels. Patient would like to get established in office to discuss issues and family history of colon cancer, I have scheduled her an office visit at first available.

## 2017-10-15 MED FILL — ATORVASTATIN 20 MG TABLET: 20 | 90 days supply | Qty: 90 | Fill #1

## 2017-11-04 DIAGNOSIS — J020 Streptococcal pharyngitis: Secondary | ICD-10-CM | POA: Diagnosis not present

## 2017-11-04 DIAGNOSIS — Z6823 Body mass index (BMI) 23.0-23.9, adult: Secondary | ICD-10-CM | POA: Diagnosis not present

## 2017-11-04 DIAGNOSIS — M791 Myalgia, unspecified site: Secondary | ICD-10-CM | POA: Diagnosis not present

## 2017-11-04 DIAGNOSIS — R05 Cough: Secondary | ICD-10-CM | POA: Diagnosis not present

## 2017-11-04 MED FILL — ONDANSETRON HCL 4 MG TABLET: 4 | 8 days supply | Qty: 30 | Fill #0

## 2017-11-04 MED FILL — HYDROCODONE-HOMATROPINE SYR: 5-1.5 | 12 days supply | Qty: 180 | Fill #0

## 2017-11-05 MED FILL — AZITHROMYCIN 250 MG TABLET: 250 | 5 days supply | Qty: 6 | Fill #0

## 2017-11-06 ENCOUNTER — Encounter: Payer: Self-pay | Admitting: Gastroenterology

## 2017-11-06 ENCOUNTER — Ambulatory Visit: Payer: 59 | Admitting: Gastroenterology

## 2017-11-06 VITALS — BP 122/72 | HR 88 | Ht 65.0 in | Wt 148.0 lb

## 2017-11-06 DIAGNOSIS — R194 Change in bowel habit: Secondary | ICD-10-CM | POA: Diagnosis not present

## 2017-11-06 DIAGNOSIS — K625 Hemorrhage of anus and rectum: Secondary | ICD-10-CM | POA: Diagnosis not present

## 2017-11-06 DIAGNOSIS — R151 Fecal smearing: Secondary | ICD-10-CM

## 2017-11-06 NOTE — Patient Instructions (Addendum)
Citrucel fiber - one tablespoon in glass of water or juice daily. This will help keep stool formed and make sure you get completely evacuated.  If you are age 57 or older, your body mass index should be between 23-30. Your Body mass index is 24.63 kg/m. If this is out of the aforementioned range listed, please consider follow up with your Primary Care Provider.  If you are age 40 or younger, your body mass index should be between 19-25. Your Body mass index is 24.63 kg/m. If this is out of the aformentioned range listed, please consider follow up with your Primary Care Provider.   Thank you for choosing [facility]  Dr Wilfrid Lund III

## 2017-11-06 NOTE — Progress Notes (Signed)
     [facility] Progress Note  Chief Complaint: Rectal bleeding  Subjective  History:  This is a 57 year old woman seeing me for rectal bleeding and fecal incontinence.  She reports it is been going on for many years, and she mentioned it in the postop area after her routine colonoscopy in November 2018.  That exam was done for history of colon polyps, 3 mm tubular adenoma was removed. She reports intermittent blood on the stool.  She was having some difficulty with constipation but felt it was better after she started drinking a special tea.  She also sometimes feels urgency for bowel movements and some rectal discomfort and at times has small volume fecal incontinence. She also reports generalized abdominal "sensitivity".  ROS: Cardiovascular:  no chest pain Respiratory: no dyspnea  The patient's Past Medical, Family and Social History were reviewed and are on file in the EMR.  Objective:  Med list reviewed  Current Outpatient Medications:  .  atorvastatin (LIPITOR) 20 MG tablet, Take 20 mg by mouth daily at 6 PM., Disp: , Rfl:  .  azithromycin (ZITHROMAX Z-PAK) 250 MG tablet, Take 250 mg by mouth daily., Disp: , Rfl:  .  carvedilol (COREG) 12.5 MG tablet, Take 12.5 mg by mouth 2 (two) times daily., Disp: , Rfl: 5 .  Estradiol Acetate (FEMRING) 0.05 MG/24HR RING, Place 1 each vaginally every 3 (three) months., Disp: , Rfl:  .  LORazepam (ATIVAN) 0.5 MG tablet, Take 1 tablet (0.5 mg total) by mouth every 8 (eight) hours., Disp: 15 tablet, Rfl: 0   Vital signs in last 24 hrs: Vitals:   11/06/17 1547  BP: 122/72  Pulse: 88    Physical Exam  Exam chaperoned by our MA Toni  HEENT: sclera anicteric, oral mucosa moist without lesions  Neck: supple, no thyromegaly, JVD or lymphadenopathy  Cardiac: RRR without murmurs, S1S2 heard, no peripheral edema  Pulm: clear to auscultation bilaterally, normal RR and effort noted  Abdomen: soft, mild lower tenderness, with active  bowel sounds. No guarding or palpable hepatosplenomegaly.  Skin; warm and dry, no jaundice or rash Rectal:  Firm stool in rectal vault, normal sphincter tone No HR, no fissure Anoscopy: No fissure, no distal rectal abnormalities, no internal hemorrhoids.    @ASSESSMENTPLANBEGIN @ Assessment: Encounter Diagnoses  Name Primary?  [name] Anal bleeding Yes  . Altered bowel habits   . Fecal smearing    It is not clear why she reports frequent bleeding, since there is no obvious finding on colonoscopy or endoscopy.   she has normal internal hemorrhoidal plexus with no enlargement or prolapse to suggest symptomatic hemorrhoids.  It sounds like she may have some mild underlying IBS with symptoms as described above.  She seems to be having benign anal bleeding.  I recommended a fiber supplement to help regulate bowel habits and perhaps give more complete evacuation so she is less likely to have fecal incontinence.  I would not advocate hemorrhoidal banding or surgical evaluation.   Total time 20 minutes, over half spent in counseling and coordination of care, separate from any billable procedures.  Nelida Meuse III

## 2017-11-11 MED FILL — PREMARIN 0.625 MG TABLET: 0.625 | 30 days supply | Qty: 30 | Fill #0

## 2017-11-12 MED FILL — CEFDINIR 300 MG CAPSULE: 300 | 7 days supply | Qty: 14 | Fill #0

## 2017-11-13 ENCOUNTER — Encounter (HOSPITAL_COMMUNITY): Payer: Self-pay | Admitting: *Deleted

## 2017-11-13 ENCOUNTER — Emergency Department (HOSPITAL_COMMUNITY)
Admission: EM | Admit: 2017-11-13 | Discharge: 2017-11-13 | Disposition: A | Discharge status: Home / Self Care | Payer: 59 | Attending: Emergency Medicine | Admitting: Emergency Medicine

## 2017-11-13 ENCOUNTER — Telehealth: Payer: Self-pay | Admitting: Emergency Medicine

## 2017-11-13 ENCOUNTER — Other Ambulatory Visit: Payer: Self-pay

## 2017-11-13 DIAGNOSIS — I10 Essential (primary) hypertension: Secondary | ICD-10-CM | POA: Diagnosis not present

## 2017-11-13 DIAGNOSIS — T7840XA Allergy, unspecified, initial encounter: Secondary | ICD-10-CM | POA: Diagnosis present

## 2017-11-13 DIAGNOSIS — Z79899 Other long term (current) drug therapy: Secondary | ICD-10-CM | POA: Insufficient documentation

## 2017-11-13 DIAGNOSIS — Z91040 Latex allergy status: Secondary | ICD-10-CM | POA: Insufficient documentation

## 2017-11-13 DIAGNOSIS — T782XXA Anaphylactic shock, unspecified, initial encounter: Secondary | ICD-10-CM | POA: Insufficient documentation

## 2017-11-13 DIAGNOSIS — R000 Tachycardia, unspecified: Secondary | ICD-10-CM | POA: Diagnosis not present

## 2017-11-13 MED ORDER — PREDNISONE 20 MG PO TABS: 40.0000 mg | ORAL_TABLET | Freq: Every day | ORAL | 0 refills | Status: DC

## 2017-11-13 MED ORDER — CLINDAMYCIN HCL 150 MG PO CAPS: 300.0000 mg | ORAL_CAPSULE | Freq: Three times a day (TID) | ORAL | 0 refills | Status: AC

## 2017-11-13 MED ORDER — DIPHENHYDRAMINE HCL 25 MG PO TABS: 25.0000 mg | ORAL_TABLET | Freq: Three times a day (TID) | ORAL | 0 refills | Status: DC

## 2017-11-13 MED ORDER — DIPHENHYDRAMINE HCL 50 MG/ML IJ SOLN
25.0000 mg | Freq: Once | INTRAMUSCULAR | Status: AC
  Administered 2017-11-13: 25 mg via INTRAVENOUS
  Filled 2017-11-13: qty 1

## 2017-11-13 MED ORDER — FAMOTIDINE 20 MG PO TABS: 20.0000 mg | ORAL_TABLET | Freq: Two times a day (BID) | ORAL | 0 refills | Status: DC

## 2017-11-13 MED ORDER — FAMOTIDINE IN NACL 20-0.9 MG/50ML-% IV SOLN
20.0000 mg | Freq: Once | INTRAVENOUS | Status: AC
  Administered 2017-11-13: 20 mg via INTRAVENOUS
  Filled 2017-11-13: qty 50

## 2017-11-13 MED ORDER — METHYLPREDNISOLONE SODIUM SUCC 125 MG IJ SOLR
125.0000 mg | Freq: Once | INTRAMUSCULAR | Status: AC
  Administered 2017-11-13: 125 mg via INTRAVENOUS
  Filled 2017-11-13: qty 2

## 2017-11-13 MED ORDER — EPINEPHRINE 0.15 MG/0.15ML IJ SOAJ: 0.1500 mg | INTRAMUSCULAR | 0 refills | Status: DC | PRN

## 2017-11-13 MED ORDER — EPINEPHRINE 0.3 MG/0.3ML IJ SOAJ
0.3000 mg | Freq: Once | INTRAMUSCULAR | Status: AC
  Administered 2017-11-13: 0.3 mg via INTRAMUSCULAR

## 2017-11-13 MED ORDER — HYDROXYZINE HCL 10 MG/5ML PO SYRP
10.0000 mg | ORAL_SOLUTION | Freq: Once | ORAL | Status: AC
  Administered 2017-11-13: 10 mg via ORAL
  Filled 2017-11-13: qty 5

## 2017-11-13 MED FILL — EPINEPHRINE 0.3 MG AUTO-INJ: 0.3 | 30 days supply | Qty: 2 | Fill #0

## 2017-11-13 MED FILL — predniSONE 20 MG TABS: 20 | 4 days supply | Qty: 8 | Fill #0

## 2017-11-13 MED FILL — CLINDAMYCIN HCL 150 MG CAPS: 150 | 7 days supply | Qty: 42 | Fill #0

## 2017-11-13 MED FILL — FAMOTIDINE 20 MG TABLET: 20 | 5 days supply | Qty: 10 | Fill #0

## 2017-11-13 NOTE — Telephone Encounter (Signed)
Received a call from [facility] [place] s/p epi pen dosage.  Dr. Vanita Panda wrote prescription for pediatric dosage instead of adult dosing.  Both Megan, pharm D, and CM could find no reasoning for the lower dose.  CM noted pt was given adult dose at 0.3 mg while in the ED.  CM advised it was ok to fill at adult dose.  No further CM needs noted at this time.

## 2017-11-13 NOTE — ED Notes (Signed)
All meds put in verbally per Dr Vanita Panda.

## 2017-11-13 NOTE — ED Triage Notes (Signed)
Pt is employee at [employer].  Was found walking in hallway, panting stating she was having an allergic reaction.

## 2017-11-13 NOTE — ED Notes (Signed)
Pt verbalized understanding of d/c instructions and has no further questions. VSS, NAD.  

## 2017-11-13 NOTE — ED Provider Notes (Signed)
[facility] EMERGENCY DEPARTMENT Provider Note   CSN: [id number] Arrival date & time: 11/13/17  0910     History   Chief Complaint Chief Complaint  Patient presents with  . Allergic Reaction    HPI [name] is a 57 y.o. female.  HPI  Patient presents in extremis, with pursed lip breathing, tachypnea, inability to speak from presumed allergic reaction. Patient arrives with coworkers who notes that she took her first tablet for strep throat, then felt dyspneic, with throat tightening. The note the patient has a penicillin allergy, and voices suspicion that she received a tablet with penicillin. States that the patient was otherwise well-appearing at work prior to the event. Level 5 caveat secondary to acuity of condition.  Past Medical History:  Diagnosis Date  . Anxiety   . Back pain    related to spinal stenosis and disc problem, radiates down left buttocks to leg., weakness occ.  . Hypertension   . PONV (postoperative nausea and vomiting)   . Vaginal foreign object    "Uses Femring"    Patient Active Problem List   Diagnosis Date Noted  . Spinal stenosis of lumbar region 12/28/2015  . Chest pain 04/16/2015    Past Surgical History:  Procedure Laterality Date  . ABDOMINAL HYSTERECTOMY    . CARDIAC CATHETERIZATION N/A 04/18/2015   Procedure: Left Heart Cath and Coronary Angiography;  Surgeon: Mohan Harwani, MD;  Location: [facility] INVASIVE CV LAB;  Service: Cardiovascular;  Laterality: N/A;  . FOOT SURGERY Bilateral    [facility] "bunion,bone spur, tendon" (1) -6'16, (1)-10'16  . LUMBAR LAMINECTOMY/DECOMPRESSION MICRODISCECTOMY Bilateral 12/28/2015   Procedure: MICRO LUMBAR DECOMPRESSION L4 - L5 BILATERALLY;  Surgeon: Jeffrey Beane, MD;  Location: [facility] ORS;  Service: Orthopedics;  Laterality: Bilateral;  . WISDOM TOOTH EXTRACTION      OB History    No data available       Home Medications    Prior to Admission medications     Medication Sig Start Date End Date Taking? Authorizing Provider  atorvastatin (LIPITOR) 20 MG tablet Take 20 mg by mouth daily at 6 PM.    [provider]  azithromycin (ZITHROMAX Z-PAK) 250 MG tablet Take 250 mg by mouth daily.    [provider]  carvedilol (COREG) 12.5 MG tablet Take 12.5 mg by mouth 2 (two) times daily. 05/02/16   [provider]  Estradiol Acetate (FEMRING) 0.05 MG/24HR RING Place 1 each vaginally every 3 (three) months.    [provider]  LORazepam (ATIVAN) 0.5 MG tablet Take 1 tablet (0.5 mg total) by mouth every 8 (eight) hours. 07/09/17   Lawyer, Christopher, PA-C    Family History Family History  Problem Relation Age of Onset  . Lung cancer Father   . Pancreatic cancer Sister   . Breast cancer Sister   . Throat cancer Brother   . Breast cancer Sister   . Cancer Sister   . Multiple myeloma Sister   . Stomach cancer Cousin   . Colon cancer Neg Hx     Social History Social History   Tobacco Use  . Smoking status: Never Smoker  . Smokeless tobacco: Never Used  Substance Use Topics  . Alcohol use: No  . Drug use: No     Allergies   Penicillins; Anesthetics, amide; Peach [prunus persica]; and Latex   Review of Systems Review of Systems  Unable to perform ROS: Acuity of condition     Physical Exam Updated   Vital Signs BP 130/67 (BP Location: Left Arm)   Pulse 67   Temp (!) 97.4 F (36.3 C) (Oral)   Resp 16   Ht 5' 5" (1.651 m)   Wt 64.4 kg (142 lb)   SpO2 99%   BMI 23.63 kg/m    Physical Exam  Constitutional: She is oriented to person, place, and time. She appears well-developed and well-nourished. She appears distressed.  HENT:  Head: Normocephalic and atraumatic.  Unable to completely visualize the posterior oropharynx, but no gross asymmetry, no gross edema.  Eyes: Conjunctivae and EOM are normal.  Cardiovascular: Regular rhythm. Tachycardia present.  Pulmonary/Chest: Effort normal and breath  sounds normal. No stridor. No respiratory distress.  Diminished breath sounds, bilateral, no wheezing  Abdominal: She exhibits no distension.  Musculoskeletal: She exhibits no edema.  Neurological: She is alert and oriented to person, place, and time. No cranial nerve deficit.  Skin: Skin is warm and dry.  Psychiatric: She has a normal mood and affect.  Nursing note and vitals reviewed.    ED Treatments / Results   Procedures Procedures (including critical care time)  Medications Ordered in ED Medications  EPINEPHrine (EPI-PEN) injection 0.3 mg (0.3 mg Intramuscular Given 11/13/17 0915)  diphenhydrAMINE (BENADRYL) injection 25 mg (25 mg Intravenous Given 11/13/17 0938)  methylPREDNISolone sodium succinate (SOLU-MEDROL) 125 mg/2 mL injection 125 mg (125 mg Intravenous Given 11/13/17 0938)  famotidine (PEPCID) IVPB 20 mg premix (0 mg Intravenous Stopped 11/13/17 1053)     Initial Impression / Assessment and Plan / ED Course  I have reviewed the triage vital signs and the nursing notes.  Pertinent labs & imaging results that were available during my care of the patient were reviewed by me and considered in my medical decision making (see chart for details).    Immediately after the initial evaluation with concern for anaphylaxis given the patient's known penicillin allergy, suspicion of taking penicillin, and her presentation in respiratory distress, the patient received epinephrine, Benadryl, Pepcid, fluids, Solu-Medrol.  Update:, Patient's potentially more calm, though she continues to have mild itching, no ongoing dyspnea.   11:36 AM Patient substantially improved, now requesting fluids.   Update patient in no distress, no ongoing complaints. Review the patient's medication notes that she is taking Cefdinir which does not have penicillin in it. We discussed the possible cross reactivity with penicillin, the patient will avoid this medication in the future. With resolution here,  after several hours of monitoring, no rebound, no evidence for ongoing anaphylaxis, patient appropriate for discharge with outpatient follow-up.  Final Clinical Impressions(s) / ED Diagnoses  Anaphylaxis  CRITICAL CARE Performed by: Robert Lockwood Total critical care time: 40 minutes Critical care time was exclusive of separately billable procedures and treating other patients. Critical care was necessary to treat or prevent imminent or life-threatening deterioration. Critical care was time spent personally by me on the following activities: development of treatment plan with patient and/or surrogate as well as nursing, discussions with consultants, evaluation of patient's response to treatment, examination of patient, obtaining history from patient or surrogate, ordering and performing treatments and interventions, ordering and review of laboratory studies, ordering and review of radiographic studies, pulse oximetry and re-evaluation of patient's condition.   Lockwood, Robert, MD 11/13/17 1137 

## 2017-11-13 NOTE — Discharge Instructions (Signed)
As discussed, it is important to monitor your condition carefully, and be sure to follow-up with your primary care physician. Please take all medication as directed and return here for concerning changes.

## 2017-11-17 DIAGNOSIS — R197 Diarrhea, unspecified: Secondary | ICD-10-CM | POA: Diagnosis not present

## 2017-11-17 DIAGNOSIS — J020 Streptococcal pharyngitis: Secondary | ICD-10-CM | POA: Diagnosis not present

## 2017-11-17 DIAGNOSIS — R070 Pain in throat: Secondary | ICD-10-CM | POA: Diagnosis not present

## 2017-11-17 DIAGNOSIS — Z880 Allergy status to penicillin: Secondary | ICD-10-CM | POA: Diagnosis not present

## 2017-11-17 DIAGNOSIS — M542 Cervicalgia: Secondary | ICD-10-CM | POA: Diagnosis not present

## 2017-11-17 DIAGNOSIS — Z6830 Body mass index (BMI) 30.0-30.9, adult: Secondary | ICD-10-CM | POA: Diagnosis not present

## 2017-11-17 MED FILL — ALPRAZolam 1 MG TABS: 1 | 15 days supply | Qty: 15 | Fill #0

## 2017-11-17 MED FILL — metroNIDAZOLE 500 MG TABS: 500 | 10 days supply | Qty: 30 | Fill #0

## 2017-11-19 DIAGNOSIS — R197 Diarrhea, unspecified: Secondary | ICD-10-CM | POA: Diagnosis not present

## 2017-11-20 ENCOUNTER — Other Ambulatory Visit: Payer: Self-pay | Admitting: Internal Medicine

## 2017-11-20 DIAGNOSIS — R197 Diarrhea, unspecified: Secondary | ICD-10-CM

## 2017-11-20 DIAGNOSIS — M542 Cervicalgia: Secondary | ICD-10-CM

## 2017-11-20 DIAGNOSIS — R070 Pain in throat: Secondary | ICD-10-CM

## 2017-11-20 DIAGNOSIS — J020 Streptococcal pharyngitis: Secondary | ICD-10-CM

## 2017-11-23 MED FILL — CARVEDILOL 12.5 MG TABLET: 12.5 | 90 days supply | Qty: 180 | Fill #1

## 2017-11-25 ENCOUNTER — Ambulatory Visit
Admission: RE | Admit: 2017-11-25 | Discharge: 2017-11-25 | Disposition: A | Discharge status: Home / Self Care | Payer: 59 | Source: Ambulatory Visit | Attending: Internal Medicine | Admitting: Internal Medicine

## 2017-11-25 DIAGNOSIS — J020 Streptococcal pharyngitis: Secondary | ICD-10-CM

## 2017-11-25 DIAGNOSIS — R197 Diarrhea, unspecified: Secondary | ICD-10-CM

## 2017-11-25 DIAGNOSIS — R070 Pain in throat: Secondary | ICD-10-CM

## 2017-11-25 DIAGNOSIS — M542 Cervicalgia: Secondary | ICD-10-CM

## 2017-11-25 MED ORDER — IOPAMIDOL (ISOVUE-300) INJECTION 61%
75.0000 mL | Freq: Once | INTRAVENOUS | Status: AC | PRN
  Administered 2017-11-25: 75 mL via INTRAVENOUS

## 2017-12-03 ENCOUNTER — Other Ambulatory Visit (HOSPITAL_COMMUNITY)
Admission: RE | Admit: 2017-12-03 | Discharge: 2017-12-03 | Disposition: A | Discharge status: Home / Self Care | Payer: 59 | Source: Ambulatory Visit | Attending: Physician Assistant | Admitting: Physician Assistant

## 2017-12-03 DIAGNOSIS — L668 Other cicatricial alopecia: Secondary | ICD-10-CM | POA: Diagnosis not present

## 2017-12-03 DIAGNOSIS — L659 Nonscarring hair loss, unspecified: Secondary | ICD-10-CM | POA: Diagnosis not present

## 2017-12-03 DIAGNOSIS — L299 Pruritus, unspecified: Secondary | ICD-10-CM | POA: Diagnosis not present

## 2017-12-03 LAB — CBC WITH DIFFERENTIAL/PLATELET
BASOS ABS: 0 10*3/uL (ref 0.0–0.1)
Basophils Relative: 1 %
EOS ABS: 0.1 10*3/uL (ref 0.0–0.7)
EOS PCT: 2 %
HCT: 36.9 % (ref 36.0–46.0)
Hemoglobin: 12.3 g/dL (ref 12.0–15.0)
LYMPHS ABS: 1.7 10*3/uL (ref 0.7–4.0)
Lymphocytes Relative: 45 %
MCH: 30.3 pg (ref 26.0–34.0)
MCHC: 33.3 g/dL (ref 30.0–36.0)
MCV: 90.9 fL (ref 78.0–100.0)
Monocytes Absolute: 0.3 10*3/uL (ref 0.1–1.0)
Monocytes Relative: 8 %
Neutro Abs: 1.7 10*3/uL (ref 1.7–7.7)
Neutrophils Relative %: 44 %
PLATELETS: 257 10*3/uL (ref 150–400)
RBC: 4.06 MIL/uL (ref 3.87–5.11)
RDW: 13.3 % (ref 11.5–15.5)
WBC: 3.8 10*3/uL — AB (ref 4.0–10.5)

## 2017-12-03 LAB — IRON AND TIBC
Iron: 93 ug/dL (ref 28–170)
Saturation Ratios: 29 % (ref 10.4–31.8)
TIBC: 319 ug/dL (ref 250–450)
UIBC: 226 ug/dL

## 2017-12-03 LAB — FERRITIN: Ferritin: 93 ng/mL (ref 11–307)

## 2017-12-03 LAB — TSH: TSH: 1.484 u[IU]/mL (ref 0.350–4.500)

## 2017-12-03 LAB — T4, FREE: Free T4: 1.02 ng/dL (ref 0.61–1.12)

## 2017-12-04 LAB — VITAMIN D PNL(25-HYDRXY+1,25-DIHY)-BLD
Vit D, 1,25-Dihydroxy: 39 pg/mL (ref 19.9–79.3)
Vit D, 25-Hydroxy: 29.5 ng/mL — ABNORMAL LOW (ref 30.0–100.0)

## 2017-12-05 LAB — ZINC: ZINC: 62 ug/dL (ref 56–134)

## 2017-12-09 MED FILL — PREMARIN 0.625 MG TABLET: 0.625 | 90 days supply | Qty: 90 | Fill #1

## 2017-12-14 MED FILL — DOXYCYCLINE HYCLATE 100 MG: 100 | 30 days supply | Qty: 60 | Fill #0

## 2018-01-14 MED FILL — DOXYCYCLINE HYCLATE 100 MG: 100 | 30 days supply | Qty: 60 | Fill #1

## 2018-01-18 DIAGNOSIS — M5417 Radiculopathy, lumbosacral region: Secondary | ICD-10-CM | POA: Diagnosis not present

## 2018-01-18 DIAGNOSIS — Z9889 Other specified postprocedural states: Secondary | ICD-10-CM | POA: Diagnosis not present

## 2018-01-18 DIAGNOSIS — M545 Low back pain: Secondary | ICD-10-CM | POA: Diagnosis not present

## 2018-01-18 MED FILL — predniSONE 5 MG TABS: 5 | 21 days supply | Qty: 21 | Fill #0

## 2018-01-18 MED FILL — OXYCODONE-ACETAMINOPHEN 5-3: 5-325 | 5 days supply | Qty: 20 | Fill #0

## 2018-01-18 MED FILL — METHOCARBAMOL 500 MG TABS: 500 | 10 days supply | Qty: 40 | Fill #0

## 2018-01-25 ENCOUNTER — Other Ambulatory Visit: Payer: Self-pay | Admitting: Nurse Practitioner

## 2018-01-25 DIAGNOSIS — M545 Low back pain: Secondary | ICD-10-CM

## 2018-01-25 DIAGNOSIS — Z9889 Other specified postprocedural states: Secondary | ICD-10-CM | POA: Diagnosis not present

## 2018-01-25 DIAGNOSIS — M5417 Radiculopathy, lumbosacral region: Secondary | ICD-10-CM | POA: Diagnosis not present

## 2018-01-25 MED FILL — LORazepam 1 MG TABS: 1 | 1 days supply | Qty: 2 | Fill #0

## 2018-01-25 MED FILL — traMADol HCL 50 MG TABS: 50 | 10 days supply | Qty: 40 | Fill #0

## 2018-01-26 ENCOUNTER — Other Ambulatory Visit (HOSPITAL_COMMUNITY): Payer: Self-pay | Admitting: Specialist

## 2018-01-26 DIAGNOSIS — M5417 Radiculopathy, lumbosacral region: Secondary | ICD-10-CM

## 2018-01-30 ENCOUNTER — Ambulatory Visit (HOSPITAL_COMMUNITY): Payer: 59

## 2018-02-01 ENCOUNTER — Ambulatory Visit (HOSPITAL_COMMUNITY)
Admission: RE | Admit: 2018-02-01 | Discharge: 2018-02-01 | Disposition: A | Discharge status: Home / Self Care | Payer: 59 | Source: Ambulatory Visit | Attending: Specialist | Admitting: Specialist

## 2018-02-01 ENCOUNTER — Ambulatory Visit (HOSPITAL_COMMUNITY): Payer: 59

## 2018-02-01 DIAGNOSIS — M48061 Spinal stenosis, lumbar region without neurogenic claudication: Secondary | ICD-10-CM | POA: Insufficient documentation

## 2018-02-01 DIAGNOSIS — M8938 Hypertrophy of bone, other site: Secondary | ICD-10-CM | POA: Diagnosis not present

## 2018-02-01 DIAGNOSIS — M5126 Other intervertebral disc displacement, lumbar region: Secondary | ICD-10-CM | POA: Insufficient documentation

## 2018-02-01 DIAGNOSIS — M5417 Radiculopathy, lumbosacral region: Secondary | ICD-10-CM | POA: Insufficient documentation

## 2018-02-01 DIAGNOSIS — M5116 Intervertebral disc disorders with radiculopathy, lumbar region: Secondary | ICD-10-CM | POA: Diagnosis not present

## 2018-02-01 DIAGNOSIS — R600 Localized edema: Secondary | ICD-10-CM | POA: Diagnosis not present

## 2018-02-01 LAB — CREATININE, SERUM
Creatinine, Ser: 1.06 mg/dL — ABNORMAL HIGH (ref 0.44–1.00)
GFR calc non Af Amer: 58 mL/min — ABNORMAL LOW (ref >60)

## 2018-02-01 MED ORDER — GADOBENATE DIMEGLUMINE 529 MG/ML IV SOLN
15.0000 mL | Freq: Once | INTRAVENOUS | Status: AC | PRN
  Administered 2018-02-01: 15 mL via INTRAVENOUS

## 2018-02-02 ENCOUNTER — Other Ambulatory Visit: Payer: Self-pay | Admitting: Obstetrics and Gynecology

## 2018-02-02 DIAGNOSIS — Z1231 Encounter for screening mammogram for malignant neoplasm of breast: Secondary | ICD-10-CM

## 2018-02-03 DIAGNOSIS — M4800 Spinal stenosis, site unspecified: Secondary | ICD-10-CM | POA: Diagnosis not present

## 2018-02-03 DIAGNOSIS — Z9889 Other specified postprocedural states: Secondary | ICD-10-CM | POA: Diagnosis not present

## 2018-02-03 DIAGNOSIS — M519 Unspecified thoracic, thoracolumbar and lumbosacral intervertebral disc disorder: Secondary | ICD-10-CM | POA: Diagnosis not present

## 2018-02-03 DIAGNOSIS — M545 Low back pain: Secondary | ICD-10-CM | POA: Diagnosis not present

## 2018-02-03 DIAGNOSIS — M5136 Other intervertebral disc degeneration, lumbar region: Secondary | ICD-10-CM | POA: Diagnosis not present

## 2018-02-08 DIAGNOSIS — Z01818 Encounter for other preprocedural examination: Secondary | ICD-10-CM | POA: Diagnosis not present

## 2018-02-08 DIAGNOSIS — I10 Essential (primary) hypertension: Secondary | ICD-10-CM | POA: Diagnosis not present

## 2018-02-08 DIAGNOSIS — M5416 Radiculopathy, lumbar region: Secondary | ICD-10-CM | POA: Diagnosis not present

## 2018-02-11 DIAGNOSIS — M48061 Spinal stenosis, lumbar region without neurogenic claudication: Secondary | ICD-10-CM | POA: Diagnosis not present

## 2018-02-11 DIAGNOSIS — M5136 Other intervertebral disc degeneration, lumbar region: Secondary | ICD-10-CM | POA: Diagnosis not present

## 2018-02-11 DIAGNOSIS — M545 Low back pain: Secondary | ICD-10-CM | POA: Diagnosis not present

## 2018-02-17 ENCOUNTER — Ambulatory Visit: Payer: Self-pay | Admitting: Orthopedic Surgery

## 2018-02-22 NOTE — Pre-Procedure Instructions (Signed)
[name]  02/22/2018      [id number] [address], [place]  [address] Phone: [phone] Fax: [place], [name] - [address] [name] [address] Phone: [phone] Fax: [phone]    Your procedure is scheduled on Thurs., Mar 04, 2018  Report to [name] Admitting Entrance "A" at 5:30AM  Call this number if you have problems the morning of surgery:  [phone]   Remember:  Do not eat food or drink liquids after midnight.  Take these medicines the morning of surgery with A SIP OF WATER: Carvedilol (COREG) and PREMARIN. If needed OxyCODONE-acetaminophen (PERCOCET/ROXICET) for pain, LORazepam (ATIVAN) for anxiety, and EPINEPHrine for an allergic reaction  7 days before surgery (5/2), stop taking all Aspirins, Vitamins, Fish oils, and Herbal medications. Also stop all NSAIDS i.e. Advil, Ibuprofen, Motrin, Aleve, Anaprox, Naproxen, BC and Goody Powders.   Do not wear jewelry, make-up or nail polish.  Do not wear lotions, powders, perfumes, or deodorant.  Do not shave 48 hours prior to surgery.    Do not bring valuables to the hospital.  [facility] is not responsible for any belongings or valuables.  Contacts, dentures or bridgework may not be worn into surgery.  Leave your suitcase in the car.  After surgery it may be brought to your room.  For patients admitted to the hospital, discharge time will be determined by your treatment team.  Patients discharged the day of surgery will not be allowed to drive home.   Special instructions:   [facility]- Preparing For Surgery  Before surgery, you can play an important role. Because skin is not sterile, your skin needs to be as free of germs as possible. You can reduce the number of germs on your skin by washing with CHG (chlorahexidine gluconate) Soap before  surgery.  CHG is an antiseptic cleaner which kills germs and bonds with the skin to continue killing germs even after washing.  Please do not use if you have an allergy to CHG or antibacterial soaps. If your skin becomes reddened/irritated stop using the CHG.  Do not shave (including legs and underarms) for at least 48 hours prior to first CHG shower. It is OK to shave your face.  Please follow these instructions carefully.   1. Shower the NIGHT BEFORE SURGERY and the MORNING OF SURGERY with CHG.   2. If you chose to wash your hair, wash your hair first as usual with your normal shampoo.  3. After you shampoo, rinse your hair and body thoroughly to remove the shampoo.  4. Use CHG as you would any other liquid soap. You can apply CHG directly to the skin and wash gently with a scrungie or a clean washcloth.   5. Apply the CHG Soap to your body ONLY FROM THE NECK DOWN.  Do not use on open wounds or open sores. Avoid contact with your eyes, ears, mouth and genitals (private parts). Wash Face and genitals (private parts)  with your normal soap.  6. Wash thoroughly, paying special attention to the area where your surgery will be performed.  7. Thoroughly rinse your body with warm water from the neck down.  8. DO NOT shower/wash with your normal soap after using and rinsing off the CHG Soap.  9. Pat yourself dry with a CLEAN TOWEL.  10. Wear CLEAN PAJAMAS to bed the  night before surgery, wear comfortable clothes the morning of surgery  11. Place CLEAN SHEETS on your bed the night of your first shower and DO NOT SLEEP WITH PETS.  Day of Surgery: Do not apply any deodorants/lotions. Please wear clean clothes to the hospital/surgery center.    Please read over the following fact sheets that you were given. Pain Booklet, Coughing and Deep Breathing, MRSA Information and Surgical Site Infection Prevention

## 2018-02-23 ENCOUNTER — Ambulatory Visit (HOSPITAL_COMMUNITY)
Admission: RE | Admit: 2018-02-23 | Discharge: 2018-02-23 | Disposition: A | Discharge status: Home / Self Care | Payer: 59 | Source: Ambulatory Visit | Attending: Orthopedic Surgery | Admitting: Orthopedic Surgery

## 2018-02-23 ENCOUNTER — Encounter (HOSPITAL_COMMUNITY)
Admission: RE | Admit: 2018-02-23 | Discharge: 2018-02-23 | Disposition: A | Discharge status: Home / Self Care | Payer: 59 | Source: Ambulatory Visit | Attending: Specialist | Admitting: Specialist

## 2018-02-23 ENCOUNTER — Encounter (HOSPITAL_COMMUNITY): Payer: Self-pay

## 2018-02-23 ENCOUNTER — Ambulatory Visit: Payer: Self-pay | Admitting: Orthopedic Surgery

## 2018-02-23 DIAGNOSIS — M5136 Other intervertebral disc degeneration, lumbar region: Secondary | ICD-10-CM | POA: Diagnosis not present

## 2018-02-23 DIAGNOSIS — Z01812 Encounter for preprocedural laboratory examination: Secondary | ICD-10-CM | POA: Insufficient documentation

## 2018-02-23 DIAGNOSIS — Z01818 Encounter for other preprocedural examination: Secondary | ICD-10-CM | POA: Insufficient documentation

## 2018-02-23 DIAGNOSIS — M5126 Other intervertebral disc displacement, lumbar region: Secondary | ICD-10-CM | POA: Insufficient documentation

## 2018-02-23 HISTORY — DX: Headache, unspecified: R51.9

## 2018-02-23 HISTORY — DX: Headache: R51

## 2018-02-23 LAB — CBC
HCT: 40.3 % (ref 36.0–46.0)
Hemoglobin: 13.5 g/dL (ref 12.0–15.0)
MCH: 30 pg (ref 26.0–34.0)
MCHC: 33.5 g/dL (ref 30.0–36.0)
MCV: 89.6 fL (ref 78.0–100.0)
Platelets: 257 10*3/uL (ref 150–400)
RBC: 4.5 MIL/uL (ref 3.87–5.11)
RDW: 12.9 % (ref 11.5–15.5)
WBC: 3.6 10*3/uL — ABNORMAL LOW (ref 4.0–10.5)

## 2018-02-23 LAB — BASIC METABOLIC PANEL
Anion gap: 8 (ref 5–15)
BUN: 14 mg/dL (ref 6–20)
CALCIUM: 9.5 mg/dL (ref 8.9–10.3)
CO2: 28 mmol/L (ref 22–32)
Chloride: 102 mmol/L (ref 101–111)
Creatinine, Ser: 0.99 mg/dL (ref 0.44–1.00)
GFR calc Af Amer: >60 mL/min (ref >60)
Glucose, Bld: 62 mg/dL — ABNORMAL LOW (ref 65–99)
Potassium: 3.8 mmol/L (ref 3.5–5.1)
Sodium: 138 mmol/L (ref 135–145)

## 2018-02-23 LAB — SURGICAL PCR SCREEN
MRSA, PCR: NEGATIVE
STAPHYLOCOCCUS AUREUS: NEGATIVE

## 2018-02-23 NOTE — H&P (View-Only) (Signed)
[name] is an 57 y.o. female.   Chief Complaint: back and leg pain HPI: Follow-up HPI Reported by patient. Reason for Visit: low back  Context: The patient is 3 1/2 weeks out from the injury.  Location (Lower Extremity): lower back pain ; leg pain on the left, ,  Severity: pain level 10/10  Medications: The patient is currently taking Robaxin, Percocet and Lyrica Patient is reporting bilateral leg pain.  Past Medical History:  Diagnosis Date  . Anxiety   . Back pain    related to spinal stenosis and disc problem, radiates down left buttocks to leg., weakness occ.  [name] Headache   . Hypertension   . PONV (postoperative nausea and vomiting)   . Vaginal foreign object    "Uses Femring"    Past Surgical History:  Procedure Laterality Date  . ABDOMINAL HYSTERECTOMY    . CARDIAC CATHETERIZATION N/A 04/18/2015   Procedure: Left Heart Cath and Coronary Angiography;  Surgeon: [name], MD;  Location: [facility];  Service: Cardiovascular;  Laterality: N/A;  . FOOT SURGERY Bilateral    [facility] "bunion,bone spur, tendon" (1) -6'16, (1)-10'16  . LUMBAR LAMINECTOMY/DECOMPRESSION MICRODISCECTOMY Bilateral 12/28/2015   Procedure: MICRO LUMBAR DECOMPRESSION L4 - L5 BILATERALLY;  Surgeon: [name], MD;  Location: [facility];  Service: Orthopedics;  Laterality: Bilateral;  . WISDOM TOOTH EXTRACTION      Family History  Problem Relation Age of Onset  . Lung cancer Father   . Pancreatic cancer Sister   . Breast cancer Sister   . Throat cancer Brother   . Breast cancer Sister   . Cancer Sister   . Multiple myeloma Sister   . Stomach cancer Cousin   . Colon cancer Neg Hx    Social History:  reports that she has never smoked. She has never used smokeless tobacco. She reports that she does not drink alcohol or use drugs. Smoking Status: Never smoker Non-smoker Chewing tobacco: none Alcohol intake: None Work related injury?: N Advance directive: N Data processing manager of Attorney: N  Allergies:  Allergies  Allergen Reactions  . Cephalosporins Anaphylaxis  . Penicillins Anaphylaxis and Hives    Has patient had a PCN reaction causing immediate rash, facial/tongue/throat swelling, SOB or lightheadedness with hypotension: Yes Has patient had a PCN reaction causing severe rash involving mucus membranes or skin necrosis: Yes Has patient had a PCN reaction that required hospitalization Yes Has patient had a PCN reaction occurring within the last 10 years: No If all of the above answers are "NO", then may proceed with Cephalosporin use.   . Anesthetics, Amide Nausea And Vomiting    Does not know name of it. [facility].   [name] Peach [Prunus Persica] Hives  . Latex Hives, Itching and Rash   Meds ALPRAZolam 1 mg tablet atorvastatin 20 mg tablet carvedilol 12.5 mg tablet EPINEPHrine 0.3 mg/0.3 mL injection, auto-injector estradiol 0.1 mg/24 hr semiweekly transdermal patch famotidine 20 mg tablet Femring 0.05 mg/24 hr vaginal LORazepam 0.5 mg tablet Lyrica 50 mg capsule methocarbamol 500 mg tablet metroNIDAZOLE 500 mg tablet naproxen 500 mg tablet ondansetron HCl 4 mg tablet oxyCODONE-acetaminophen 5 mg-325 mg tablet Premarin 0.625 mg tablet traMADol 50 mg tablet  Review of Systems  Constitutional: Negative.   HENT: Negative.   Eyes: Negative.   Respiratory: Negative.   Cardiovascular: Negative.   Gastrointestinal: Negative.   Genitourinary: Negative.   Musculoskeletal: Positive for back pain.  Skin: Negative.  Neurological: Positive for sensory change and focal weakness.  Psychiatric/Behavioral: Negative.     There were no vitals taken for this visit. Physical Exam  Constitutional: She is oriented to person, place, and time. She appears well-developed.  HENT:  Head: Normocephalic.  Eyes: Pupils are equal, round, and reactive to light.  Neck: Normal range of motion.  Cardiovascular: Normal rate.   Respiratory: Effort normal.  GI: Soft.  Musculoskeletal:  Patient is a 57 year old female.  Constitutional: General Appearance: healthy-appearing and distress (mild).  Psychiatric: Mood and Affect: active and alert and normal affect.  Cardiovascular System: Edema Right: none; Dorsalis and posterior tibial pulses 2+. Edema Left: none.  Cervical Spine: Inspection: alignment normal and no muscle atrophy. Bony Palpation: no tenderness of the spinous process.  Motor Strength: Neck Strength (Intrinsics): extension 5/5. C5 on the Right: abduction deltoid 5/5. C5 on the Left: abduction deltoid 5/5. C6 on the Right: flexion biceps 5/5. C6 on the Left: flexion biceps 5/5. C7 on the Right: extension triceps 5/5 and flexion wrist 5/5. C7 on the Left: extension triceps 5/5 and flexion wrist 5/5. C8 on the Right: flexion fingers 5/5. C8 on the Left: flexion fingers 5/5. T1 on the Right: abduction fingers 5/5. T1 on the Left: abduction fingers 5/5.  Neurological System: Biceps Reflex Right: normal (2). Biceps Reflex Left: normal on the left (2). Brachioradialis Reflex Right: normal (2). Brachioradialis Reflex Left: normal (2). Triceps Reflex Right: normal (2). Triceps Reflex Left: normal (2). Sensation on the Right: normal median nerve distribution and ulnar nerve distribution and C5 normal, C6 normal, C7 normal, C8 normal, T1 normal, and sensation of the distal extremities normal. Sensation on the Left: normal median nerve distribution and ulnar nerve distribution and C5 normal, C6 normal, C7 normal, T1 normal, and distal extremities normal. Special Tests on the Right: Spurling's test negative. Special Tests on the Left: Spurling's test negative. Special Tests: Hoffman Sign negative. No Babinski or Clonus. Knee Reflex Right: normal (2). Knee Reflex Left: normal (2). Ankle Reflex Right: normal (2). Ankle Reflex Left: normal (2). Babinski Reflex Right: plantar reflex absent. Babinski Reflex Left: plantar reflex  absent. Special Tests on the Right: no clonus of the ankle/knee and seated straight leg raising test positive. Special Tests on the Left: no clonus of the ankle/knee and seated straight leg raising test positive.  Skin: Head and Neck: normal. Right Upper Extremity: normal. Left Upper Extremity: normal. Inspection and palpation: no rash.  Gait and Station: Appearance: ambulating with no assistive devices and antalgic gait.  Abdomen: Inspection and Palpation: non-distended and no tenderness.  Lumbar Spine: Inspection: normal alignment. Bony Palpation of the Lumbar Spine: tender at lumbosacral junction.. Bony Palpation of the Right Hip: no tenderness of the greater trochanter and tenderness of the SI joint; Pelvis stable. Bony Palpation of the Left Hip: no tenderness of the greater trochanter and tenderness of the SI joint. Soft Tissue Palpation on the Right: No flank pain with percussion. Active Range of Motion: limited flexion and extention.  Motor Strength: L1 Motor Strength on the Right: hip flexion iliopsoas 5/5. L1 Motor Strength on the Left: hip flexion iliopsoas 5/5. L2-L4 Motor Strength on the Right: knee extension quadriceps 5/5. L2-L4 Motor Strength on the Left: knee extension quadriceps 5/5. L5 Motor Strength on the Right: ankle dorsiflexion tibialis anterior 5/5 and great toe extension extensor hallucis longus 4/5. L5 Motor Strength on the Left: ankle dorsiflexion tibialis anterior 5/5 and great toe extension extensor hallucis longus 4/5. S1 Motor Strength on the  Right: plantar flexion gastrocnemius 5/5. S1 Motor Strength on the Left: plantar flexion gastrocnemius 5/5.  Neurological: She is alert and oriented to person, place, and time.  Skin: Skin is warm and dry.     Assessment/Plan X-rays lumbar spine demonstrate disc space narrowing L4-5.  MRI which was done at [facility] demonstrated severe lateral recess stenosis at L4-5.  She reports when she is up she has radiating pain particularly  in the L5 nerve root distributions he said a couple of falls. We discussed possibly repeating her MRI however we will hold off on that for now. We will proceed with surgical intervention as previously discussed.  I had an extensive discussion with the patient concerning the pathology relevant anatomy and treatment options. At this point exhausting conservative treatment and in the presence of a neurologic deficit we discussed microlumbar decompression. I discussed the risks and benefits including bleeding, infection, DVT, PE, anesthetic complications, worsening in their symptoms, improvement in their symptoms, C SF leakage, epidural fibrosis, need for future surgeries such as revision discectomy and lumbar fusion. I also indicated that this is an operation to basically decompress the nerve root to allow recovery as opposed to fixing a herniated disc and that the incidence of recurrent chest disc herniation can approach 15%. Also that nerve root recovery is variable and may not recover completely.  I discussed the operative course including overnight in the hospital. Immediate ambulation. Follow-up in 2 weeks for suture removal. 6 weeks until healing of the herniation followed by 6 weeks of reconditioning and strengthening of the core musculature. Also discussed the need to employ the concepts of disc pressure management and core motion following the surgery to minimize the risk of recurrent disc herniation. We will obtain preoperative clearance i if necessary and proceed accordingly.   Also discussed possibility of a fusion.  At this point time I gave her a note to be out of work as she is having difficulty standing and walking having pain at work.  We discussed avoiding extension and favoring flexion. If she is worse in the interim she is to call we discussed possibly reimaging.  Plan microlumbar decompression L4-5 bilateral  Cecilie Kicks., PA-C for [name] 02/23/2018, 11:24 AM

## 2018-02-23 NOTE — H&P (Addendum)
[name] is an 57 y.o. female.   Chief Complaint: back and leg pain HPI: Follow-up HPI Reported by patient. Reason for Visit: low back  Context: The patient is 3 1/2 weeks out from the injury.  Location (Lower Extremity): lower back pain ; leg pain on the left, ,  Severity: pain level 10/10  Medications: The patient is currently taking Robaxin, Percocet and Lyrica Patient is reporting bilateral leg pain.  Past Medical History:  Diagnosis Date  . Anxiety   . Back pain    related to spinal stenosis and disc problem, radiates down left buttocks to leg., weakness occ.  [name] Headache   . Hypertension   . PONV (postoperative nausea and vomiting)   . Vaginal foreign object    "Uses Femring"    Past Surgical History:  Procedure Laterality Date  . ABDOMINAL HYSTERECTOMY    . CARDIAC CATHETERIZATION N/A 04/18/2015   Procedure: Left Heart Cath and Coronary Angiography;  Surgeon: [name], MD;  Location: [facility];  Service: Cardiovascular;  Laterality: N/A;  . FOOT SURGERY Bilateral    [place] "bunion,bone spur, tendon" (1) -6'16, (1)-10'16  . LUMBAR LAMINECTOMY/DECOMPRESSION MICRODISCECTOMY Bilateral 12/28/2015   Procedure: MICRO LUMBAR DECOMPRESSION L4 - L5 BILATERALLY;  Surgeon: [name], MD;  Location: [facility];  Service: Orthopedics;  Laterality: Bilateral;  . WISDOM TOOTH EXTRACTION      Family History  Problem Relation Age of Onset  . Lung cancer Father   . Pancreatic cancer Sister   . Breast cancer Sister   . Throat cancer Brother   . Breast cancer Sister   . Cancer Sister   . Multiple myeloma Sister   . Stomach cancer Cousin   . Colon cancer Neg Hx    Social History:  reports that she has never smoked. She has never used smokeless tobacco. She reports that she does not drink alcohol or use drugs. Smoking Status: Never smoker Non-smoker Chewing tobacco: none Alcohol intake: None Work related injury?: N Advance directive: N Data processing manager of Attorney: N  Allergies:  Allergies  Allergen Reactions  . Cephalosporins Anaphylaxis  . Penicillins Anaphylaxis and Hives    Has patient had a PCN reaction causing immediate rash, facial/tongue/throat swelling, SOB or lightheadedness with hypotension: Yes Has patient had a PCN reaction causing severe rash involving mucus membranes or skin necrosis: Yes Has patient had a PCN reaction that required hospitalization Yes Has patient had a PCN reaction occurring within the last 10 years: No If all of the above answers are "NO", then may proceed with Cephalosporin use.   . Anesthetics, Amide Nausea And Vomiting    Does not know name of it. States they put it on record foot center.   [name] Peach [Prunus Persica] Hives  . Latex Hives, Itching and Rash   Meds ALPRAZolam 1 mg tablet atorvastatin 20 mg tablet carvedilol 12.5 mg tablet EPINEPHrine 0.3 mg/0.3 mL injection, auto-injector estradiol 0.1 mg/24 hr semiweekly transdermal patch famotidine 20 mg tablet Femring 0.05 mg/24 hr vaginal LORazepam 0.5 mg tablet Lyrica 50 mg capsule methocarbamol 500 mg tablet metroNIDAZOLE 500 mg tablet naproxen 500 mg tablet ondansetron HCl 4 mg tablet oxyCODONE-acetaminophen 5 mg-325 mg tablet Premarin 0.625 mg tablet traMADol 50 mg tablet  Review of Systems  Constitutional: Negative.   HENT: Negative.   Eyes: Negative.   Respiratory: Negative.   Cardiovascular: Negative.   Gastrointestinal: Negative.   Genitourinary: Negative.   Musculoskeletal: Positive for back pain.  Skin: Negative.  Neurological: Positive for sensory change and focal weakness.  Psychiatric/Behavioral: Negative.     There were no vitals taken for this visit. Physical Exam  Constitutional: She is oriented to person, place, and time. She appears well-developed.  HENT:  Head: Normocephalic.  Eyes: Pupils are equal, round, and reactive to light.  Neck: Normal range of motion.  Cardiovascular: Normal rate.   Respiratory: Effort normal.  GI: Soft.  Musculoskeletal:  Patient is a 57 year old female.  Constitutional: General Appearance: healthy-appearing and distress (mild).  Psychiatric: Mood and Affect: active and alert and normal affect.  Cardiovascular System: Edema Right: none; Dorsalis and posterior tibial pulses 2+. Edema Left: none.  Cervical Spine: Inspection: alignment normal and no muscle atrophy. Bony Palpation: no tenderness of the spinous process.  Motor Strength: Neck Strength (Intrinsics): extension 5/5. C5 on the Right: abduction deltoid 5/5. C5 on the Left: abduction deltoid 5/5. C6 on the Right: flexion biceps 5/5. C6 on the Left: flexion biceps 5/5. C7 on the Right: extension triceps 5/5 and flexion wrist 5/5. C7 on the Left: extension triceps 5/5 and flexion wrist 5/5. C8 on the Right: flexion fingers 5/5. C8 on the Left: flexion fingers 5/5. T1 on the Right: abduction fingers 5/5. T1 on the Left: abduction fingers 5/5.  Neurological System: Biceps Reflex Right: normal (2). Biceps Reflex Left: normal on the left (2). Brachioradialis Reflex Right: normal (2). Brachioradialis Reflex Left: normal (2). Triceps Reflex Right: normal (2). Triceps Reflex Left: normal (2). Sensation on the Right: normal median nerve distribution and ulnar nerve distribution and C5 normal, C6 normal, C7 normal, C8 normal, T1 normal, and sensation of the distal extremities normal. Sensation on the Left: normal median nerve distribution and ulnar nerve distribution and C5 normal, C6 normal, C7 normal, T1 normal, and distal extremities normal. Special Tests on the Right: Spurling's test negative. Special Tests on the Left: Spurling's test negative. Special Tests: Hoffman Sign negative. No Babinski or Clonus. Knee Reflex Right: normal (2). Knee Reflex Left: normal (2). Ankle Reflex Right: normal (2). Ankle Reflex Left: normal (2). Babinski Reflex Right: plantar reflex absent. Babinski Reflex Left: plantar reflex  absent. Special Tests on the Right: no clonus of the ankle/knee and seated straight leg raising test positive. Special Tests on the Left: no clonus of the ankle/knee and seated straight leg raising test positive.  Skin: Head and Neck: normal. Right Upper Extremity: normal. Left Upper Extremity: normal. Inspection and palpation: no rash.  Gait and Station: Appearance: ambulating with no assistive devices and antalgic gait.  Abdomen: Inspection and Palpation: non-distended and no tenderness.  Lumbar Spine: Inspection: normal alignment. Bony Palpation of the Lumbar Spine: tender at lumbosacral junction.. Bony Palpation of the Right Hip: no tenderness of the greater trochanter and tenderness of the SI joint; Pelvis stable. Bony Palpation of the Left Hip: no tenderness of the greater trochanter and tenderness of the SI joint. Soft Tissue Palpation on the Right: No flank pain with percussion. Active Range of Motion: limited flexion and extention.  Motor Strength: L1 Motor Strength on the Right: hip flexion iliopsoas 5/5. L1 Motor Strength on the Left: hip flexion iliopsoas 5/5. L2-L4 Motor Strength on the Right: knee extension quadriceps 5/5. L2-L4 Motor Strength on the Left: knee extension quadriceps 5/5. L5 Motor Strength on the Right: ankle dorsiflexion tibialis anterior 5/5 and great toe extension extensor hallucis longus 4/5. L5 Motor Strength on the Left: ankle dorsiflexion tibialis anterior 5/5 and great toe extension extensor hallucis longus 4/5. S1 Motor Strength on the  Right: plantar flexion gastrocnemius 5/5. S1 Motor Strength on the Left: plantar flexion gastrocnemius 5/5.  Neurological: She is alert and oriented to person, place, and time.  Skin: Skin is warm and dry.     Assessment/Plan X-rays lumbar spine demonstrate disc space narrowing L4-5.  MRI which was done at [facility] demonstrated severe lateral recess stenosis at L4-5.  She reports when she is up she has radiating pain particularly  in the L5 nerve root distributions he said a couple of falls. We discussed possibly repeating her MRI however we will hold off on that for now. We will proceed with surgical intervention as previously discussed.  I had an extensive discussion with the patient concerning the pathology relevant anatomy and treatment options. At this point exhausting conservative treatment and in the presence of a neurologic deficit we discussed microlumbar decompression. I discussed the risks and benefits including bleeding, infection, DVT, PE, anesthetic complications, worsening in their symptoms, improvement in their symptoms, C SF leakage, epidural fibrosis, need for future surgeries such as revision discectomy and lumbar fusion. I also indicated that this is an operation to basically decompress the nerve root to allow recovery as opposed to fixing a herniated disc and that the incidence of recurrent chest disc herniation can approach 15%. Also that nerve root recovery is variable and may not recover completely.  I discussed the operative course including overnight in the hospital. Immediate ambulation. Follow-up in 2 weeks for suture removal. 6 weeks until healing of the herniation followed by 6 weeks of reconditioning and strengthening of the core musculature. Also discussed the need to employ the concepts of disc pressure management and core motion following the surgery to minimize the risk of recurrent disc herniation. We will obtain preoperative clearance i if necessary and proceed accordingly.   Also discussed possibility of a fusion.  At this point time I gave her a note to be out of work as she is having difficulty standing and walking having pain at work.  We discussed avoiding extension and favoring flexion. If she is worse in the interim she is to call we discussed possibly reimaging.  Plan microlumbar decompression L4-5 bilateral  Karah Caruthers M., PA-C for Dr. Beane 02/23/2018, 11:24 AM   

## 2018-02-24 ENCOUNTER — Ambulatory Visit
Admission: RE | Admit: 2018-02-24 | Discharge: 2018-02-24 | Disposition: A | Discharge status: Home / Self Care | Payer: 59 | Source: Ambulatory Visit | Attending: Obstetrics and Gynecology | Admitting: Obstetrics and Gynecology

## 2018-02-24 DIAGNOSIS — Z1231 Encounter for screening mammogram for malignant neoplasm of breast: Secondary | ICD-10-CM

## 2018-03-03 NOTE — Anesthesia Preprocedure Evaluation (Addendum)
Anesthesia Evaluation  Patient identified by MRN, date of birth, ID band Patient awake    Reviewed: Allergy & Precautions, NPO status , Patient's Chart, lab work & pertinent test results  History of Anesthesia Complications (+) PONV  Airway Mallampati: I  TM Distance: >3 FB Neck ROM: Full    Dental  (+) Dental Advisory Given   Pulmonary neg pulmonary ROS,    breath sounds clear to auscultation       Cardiovascular hypertension, Pt. on medications and Pt. on home beta blockers (-) angina(-) CAD  Rhythm:Regular Rate:Normal  '16 cath: normal coronaries, normal LVF   Neuro/Psych Chronic back pain    GI/Hepatic Neg liver ROS, GERD  Medicated and Controlled,  Endo/Other  negative endocrine ROS  Renal/GU negative Renal ROS     Musculoskeletal   Abdominal   Peds  Hematology negative hematology ROS (+)   Anesthesia Other Findings   Reproductive/Obstetrics                            Anesthesia Physical Anesthesia Plan  ASA: II  Anesthesia Plan: General   Post-op Pain Management:    Induction: Intravenous  PONV Risk Score and Plan: 4 or greater and Ondansetron, Dexamethasone and Scopolamine patch - Pre-op  Airway Management Planned: Oral ETT  Additional Equipment:   Intra-op Plan:   Post-operative Plan: Extubation in OR  Informed Consent: I have reviewed the patients History and Physical, chart, labs and discussed the procedure including the risks, benefits and alternatives for the proposed anesthesia with the patient or authorized representative who has indicated his/her understanding and acceptance.   Dental advisory given  Plan Discussed with: CRNA and Surgeon  Anesthesia Plan Comments: (Plan routine monitors, GETA)        Anesthesia Quick Evaluation

## 2018-03-04 ENCOUNTER — Ambulatory Visit (HOSPITAL_COMMUNITY): Payer: 59 | Admitting: Certified Registered Nurse Anesthetist

## 2018-03-04 ENCOUNTER — Inpatient Hospital Stay (HOSPITAL_COMMUNITY)
Admission: AD | Admit: 2018-03-04 | Discharge: 2018-03-09 | DRG: 496 | Disposition: A | Discharge status: Home Health | Payer: 59 | Source: Ambulatory Visit | Attending: Specialist | Admitting: Specialist

## 2018-03-04 ENCOUNTER — Ambulatory Visit (HOSPITAL_COMMUNITY): Payer: 59

## 2018-03-04 ENCOUNTER — Ambulatory Visit (HOSPITAL_COMMUNITY): Payer: 59 | Admitting: Certified Registered"

## 2018-03-04 ENCOUNTER — Encounter (HOSPITAL_COMMUNITY)
Admission: AD | Disposition: A | Discharge status: Home Health | Payer: Self-pay | Source: Ambulatory Visit | Attending: Specialist

## 2018-03-04 ENCOUNTER — Inpatient Hospital Stay (HOSPITAL_COMMUNITY)
Admission: AD | Disposition: A | Discharge status: Home Health | Payer: Self-pay | Source: Ambulatory Visit | Attending: Specialist

## 2018-03-04 ENCOUNTER — Encounter (HOSPITAL_COMMUNITY): Payer: Self-pay | Admitting: Urology

## 2018-03-04 DIAGNOSIS — Z91040 Latex allergy status: Secondary | ICD-10-CM | POA: Diagnosis not present

## 2018-03-04 DIAGNOSIS — K219 Gastro-esophageal reflux disease without esophagitis: Secondary | ICD-10-CM | POA: Diagnosis not present

## 2018-03-04 DIAGNOSIS — Z79899 Other long term (current) drug therapy: Secondary | ICD-10-CM

## 2018-03-04 DIAGNOSIS — D72829 Elevated white blood cell count, unspecified: Secondary | ICD-10-CM

## 2018-03-04 DIAGNOSIS — Z91018 Allergy to other foods: Secondary | ICD-10-CM

## 2018-03-04 DIAGNOSIS — Z888 Allergy status to other drugs, medicaments and biological substances status: Secondary | ICD-10-CM | POA: Diagnosis not present

## 2018-03-04 DIAGNOSIS — M7989 Other specified soft tissue disorders: Secondary | ICD-10-CM | POA: Diagnosis not present

## 2018-03-04 DIAGNOSIS — Z419 Encounter for procedure for purposes other than remedying health state, unspecified: Secondary | ICD-10-CM

## 2018-03-04 DIAGNOSIS — F419 Anxiety disorder, unspecified: Secondary | ICD-10-CM | POA: Diagnosis not present

## 2018-03-04 DIAGNOSIS — M48061 Spinal stenosis, lumbar region without neurogenic claudication: Secondary | ICD-10-CM | POA: Diagnosis not present

## 2018-03-04 DIAGNOSIS — D62 Acute posthemorrhagic anemia: Secondary | ICD-10-CM | POA: Diagnosis not present

## 2018-03-04 DIAGNOSIS — Z880 Allergy status to penicillin: Secondary | ICD-10-CM

## 2018-03-04 DIAGNOSIS — G8929 Other chronic pain: Secondary | ICD-10-CM

## 2018-03-04 DIAGNOSIS — Z87828 Personal history of other (healed) physical injury and trauma: Secondary | ICD-10-CM | POA: Diagnosis not present

## 2018-03-04 DIAGNOSIS — I10 Essential (primary) hypertension: Secondary | ICD-10-CM

## 2018-03-04 DIAGNOSIS — R42 Dizziness and giddiness: Secondary | ICD-10-CM | POA: Diagnosis not present

## 2018-03-04 DIAGNOSIS — M79604 Pain in right leg: Secondary | ICD-10-CM

## 2018-03-04 DIAGNOSIS — R531 Weakness: Secondary | ICD-10-CM

## 2018-03-04 DIAGNOSIS — R51 Headache: Secondary | ICD-10-CM | POA: Diagnosis not present

## 2018-03-04 DIAGNOSIS — G9762 Postprocedural hematoma of a nervous system organ or structure following other procedure: Secondary | ICD-10-CM | POA: Diagnosis not present

## 2018-03-04 DIAGNOSIS — Y838 Other surgical procedures as the cause of abnormal reaction of the patient, or of later complication, without mention of misadventure at the time of the procedure: Secondary | ICD-10-CM | POA: Diagnosis not present

## 2018-03-04 DIAGNOSIS — Z981 Arthrodesis status: Secondary | ICD-10-CM | POA: Diagnosis not present

## 2018-03-04 DIAGNOSIS — R296 Repeated falls: Secondary | ICD-10-CM | POA: Diagnosis present

## 2018-03-04 DIAGNOSIS — K5900 Constipation, unspecified: Secondary | ICD-10-CM | POA: Diagnosis not present

## 2018-03-04 DIAGNOSIS — G8918 Other acute postprocedural pain: Secondary | ICD-10-CM

## 2018-03-04 DIAGNOSIS — R519 Headache, unspecified: Secondary | ICD-10-CM

## 2018-03-04 DIAGNOSIS — M96840 Postprocedural hematoma of a musculoskeletal structure following a musculoskeletal system procedure: Secondary | ICD-10-CM | POA: Diagnosis not present

## 2018-03-04 DIAGNOSIS — M545 Low back pain: Secondary | ICD-10-CM | POA: Diagnosis not present

## 2018-03-04 HISTORY — PX: LUMBAR LAMINECTOMY/DECOMPRESSION MICRODISCECTOMY: SHX5026

## 2018-03-04 HISTORY — PX: WOUND EXPLORATION: SHX6188

## 2018-03-04 SURGERY — LUMBAR LAMINECTOMY/DECOMPRESSION MICRODISCECTOMY 1 LEVEL
Anesthesia: General | Site: Spine Lumbar | Laterality: Bilateral

## 2018-03-04 SURGERY — WOUND EXPLORATION
Anesthesia: General | Site: Spine Lumbar

## 2018-03-04 MED ORDER — EPHEDRINE SULFATE 50 MG/ML IJ SOLN
INTRAMUSCULAR | Status: DC | PRN
  Administered 2018-03-04 (×2): 5 mg via INTRAVENOUS

## 2018-03-04 MED ORDER — MIDAZOLAM HCL 2 MG/2ML IJ SOLN
INTRAMUSCULAR | Status: AC
  Filled 2018-03-04: qty 2

## 2018-03-04 MED ORDER — POLYETHYLENE GLYCOL 3350 17 G PO PACK: 17.0000 g | PACK | Freq: Every day | ORAL | Status: DC | PRN

## 2018-03-04 MED ORDER — MIDAZOLAM HCL 2 MG/2ML IJ SOLN: 0.5000 mg | Freq: Once | INTRAMUSCULAR | Status: DC | PRN

## 2018-03-04 MED ORDER — SCOPOLAMINE 1 MG/3DAYS TD PT72
1.0000 | MEDICATED_PATCH | TRANSDERMAL | Status: DC
  Administered 2018-03-04: 1.5 mg via TRANSDERMAL

## 2018-03-04 MED ORDER — SUGAMMADEX SODIUM 200 MG/2ML IV SOLN
INTRAVENOUS | Status: DC | PRN
  Administered 2018-03-04: 125 mg via INTRAVENOUS

## 2018-03-04 MED ORDER — THROMBIN 20000 UNITS EX SOLR
CUTANEOUS | Status: AC
  Filled 2018-03-04: qty 20000

## 2018-03-04 MED ORDER — DEXAMETHASONE SODIUM PHOSPHATE 10 MG/ML IJ SOLN
INTRAMUSCULAR | Status: AC
  Filled 2018-03-04: qty 1

## 2018-03-04 MED ORDER — DEXAMETHASONE SODIUM PHOSPHATE 10 MG/ML IJ SOLN
6.0000 mg | Freq: Once | INTRAMUSCULAR | Status: AC
  Administered 2018-03-04: 6 mg via INTRAVENOUS

## 2018-03-04 MED ORDER — ONDANSETRON HCL 4 MG PO TABS: 4.0000 mg | ORAL_TABLET | Freq: Four times a day (QID) | ORAL | Status: DC | PRN

## 2018-03-04 MED ORDER — PROPOFOL 10 MG/ML IV BOLUS
INTRAVENOUS | Status: DC | PRN
  Administered 2018-03-04: 120 mg via INTRAVENOUS

## 2018-03-04 MED ORDER — ROCURONIUM BROMIDE 50 MG/5ML IV SOLN
INTRAVENOUS | Status: AC
  Filled 2018-03-04: qty 1

## 2018-03-04 MED ORDER — FAMOTIDINE 20 MG PO TABS
20.0000 mg | ORAL_TABLET | Freq: Two times a day (BID) | ORAL | Status: AC
  Administered 2018-03-05 – 2018-03-06 (×2): 20 mg via ORAL
  Filled 2018-03-04 (×4): qty 1

## 2018-03-04 MED ORDER — ONDANSETRON HCL 4 MG/2ML IJ SOLN
INTRAMUSCULAR | Status: DC | PRN
  Administered 2018-03-04: 4 mg via INTRAVENOUS

## 2018-03-04 MED ORDER — FENTANYL CITRATE (PF) 250 MCG/5ML IJ SOLN
INTRAMUSCULAR | Status: AC
  Filled 2018-03-04: qty 5

## 2018-03-04 MED ORDER — ESTROGENS CONJUGATED 0.625 MG PO TABS
0.6250 mg | ORAL_TABLET | Freq: Every day | ORAL | Status: DC
  Administered 2018-03-05 – 2018-03-09 (×5): 0.625 mg via ORAL
  Filled 2018-03-04 (×6): qty 1

## 2018-03-04 MED ORDER — THROMBIN 20000 UNITS EX SOLR
CUTANEOUS | Status: AC
Start: 2018-03-04 — End: ?
  Filled 2018-03-04: qty 20000

## 2018-03-04 MED ORDER — OXYCODONE HCL 5 MG PO TABS
ORAL_TABLET | ORAL | Status: AC
  Filled 2018-03-04: qty 2

## 2018-03-04 MED ORDER — VANCOMYCIN HCL IN DEXTROSE 1-5 GM/200ML-% IV SOLN
1000.0000 mg | INTRAVENOUS | Status: AC
  Administered 2018-03-04: 1000 mg via INTRAVENOUS
  Filled 2018-03-04: qty 200

## 2018-03-04 MED ORDER — ROCURONIUM BROMIDE 100 MG/10ML IV SOLN
INTRAVENOUS | Status: DC | PRN
  Administered 2018-03-04: 50 mg via INTRAVENOUS

## 2018-03-04 MED ORDER — FENTANYL CITRATE (PF) 100 MCG/2ML IJ SOLN
INTRAMUSCULAR | Status: DC | PRN
  Administered 2018-03-04: 50 ug via INTRAVENOUS
  Administered 2018-03-04: 75 ug via INTRAVENOUS
  Administered 2018-03-04 (×2): 50 ug via INTRAVENOUS

## 2018-03-04 MED ORDER — ONDANSETRON HCL 4 MG/2ML IJ SOLN
INTRAMUSCULAR | Status: AC
Start: 2018-03-04 — End: ?
  Filled 2018-03-04: qty 2

## 2018-03-04 MED ORDER — CARVEDILOL 12.5 MG PO TABS
12.5000 mg | ORAL_TABLET | Freq: Two times a day (BID) | ORAL | Status: DC
  Administered 2018-03-04 – 2018-03-09 (×10): 12.5 mg via ORAL
  Filled 2018-03-04 (×10): qty 1

## 2018-03-04 MED ORDER — METHOCARBAMOL 500 MG PO TABS
500.0000 mg | ORAL_TABLET | Freq: Four times a day (QID) | ORAL | Status: DC | PRN
  Administered 2018-03-04 – 2018-03-07 (×4): 500 mg via ORAL
  Filled 2018-03-04 (×4): qty 1

## 2018-03-04 MED ORDER — VANCOMYCIN HCL IN DEXTROSE 1-5 GM/200ML-% IV SOLN
1000.0000 mg | Freq: Two times a day (BID) | INTRAVENOUS | Status: DC
  Administered 2018-03-04 – 2018-03-05 (×3): 1000 mg via INTRAVENOUS
  Filled 2018-03-04 (×4): qty 200

## 2018-03-04 MED ORDER — OXYCODONE-ACETAMINOPHEN 5-325 MG PO TABS: 1.0000 | ORAL_TABLET | Freq: Four times a day (QID) | ORAL | 0 refills | Status: DC | PRN

## 2018-03-04 MED ORDER — LIDOCAINE HCL (CARDIAC) PF 100 MG/5ML IV SOSY
PREFILLED_SYRINGE | INTRAVENOUS | Status: DC | PRN
  Administered 2018-03-04: 40 mg via INTRAVENOUS

## 2018-03-04 MED ORDER — MIDAZOLAM HCL 5 MG/5ML IJ SOLN
INTRAMUSCULAR | Status: DC | PRN
  Administered 2018-03-04: 2 mg via INTRAVENOUS

## 2018-03-04 MED ORDER — METHOCARBAMOL 500 MG PO TABS
ORAL_TABLET | ORAL | Status: AC
  Filled 2018-03-04: qty 1

## 2018-03-04 MED ORDER — PHENYLEPHRINE HCL 10 MG/ML IJ SOLN
INTRAMUSCULAR | Status: DC | PRN
  Administered 2018-03-04 (×3): 80 ug via INTRAVENOUS
  Administered 2018-03-04: 40 ug via INTRAVENOUS
  Administered 2018-03-04: 80 ug via INTRAVENOUS
  Administered 2018-03-04: 120 ug via INTRAVENOUS

## 2018-03-04 MED ORDER — ONDANSETRON HCL 4 MG/2ML IJ SOLN
INTRAMUSCULAR | Status: AC
  Filled 2018-03-04: qty 2

## 2018-03-04 MED ORDER — SODIUM CHLORIDE 0.9 % IV SOLN
INTRAVENOUS | Status: DC | PRN
  Administered 2018-03-04: 09:00:00

## 2018-03-04 MED ORDER — HYDROMORPHONE HCL 2 MG/ML IJ SOLN
INTRAMUSCULAR | Status: AC
  Filled 2018-03-04: qty 1

## 2018-03-04 MED ORDER — FAMOTIDINE 20 MG PO TABS: 20.0000 mg | ORAL_TABLET | Freq: Two times a day (BID) | ORAL | Status: DC

## 2018-03-04 MED ORDER — MEPERIDINE HCL 50 MG/ML IJ SOLN: 6.2500 mg | INTRAMUSCULAR | Status: DC | PRN

## 2018-03-04 MED ORDER — LIDOCAINE HCL (CARDIAC) PF 100 MG/5ML IV SOSY
PREFILLED_SYRINGE | INTRAVENOUS | Status: DC | PRN
  Administered 2018-03-04: 10 mg via INTRAVENOUS

## 2018-03-04 MED ORDER — PROPOFOL 10 MG/ML IV BOLUS
INTRAVENOUS | Status: AC
  Filled 2018-03-04: qty 20

## 2018-03-04 MED ORDER — ONDANSETRON HCL 4 MG/2ML IJ SOLN
4.0000 mg | Freq: Four times a day (QID) | INTRAMUSCULAR | Status: DC | PRN
  Administered 2018-03-08: 4 mg via INTRAVENOUS
  Filled 2018-03-04: qty 2

## 2018-03-04 MED ORDER — PROPOFOL 10 MG/ML IV BOLUS
INTRAVENOUS | Status: DC | PRN
  Administered 2018-03-04: 100 mg via INTRAVENOUS

## 2018-03-04 MED ORDER — DOXYCYCLINE HYCLATE 100 MG PO TABS
100.0000 mg | ORAL_TABLET | Freq: Two times a day (BID) | ORAL | Status: DC
  Filled 2018-03-04: qty 1

## 2018-03-04 MED ORDER — THROMBIN (RECOMBINANT) 20000 UNITS EX SOLR
CUTANEOUS | Status: DC | PRN
  Administered 2018-03-04: 14:00:00 via TOPICAL

## 2018-03-04 MED ORDER — MENTHOL 3 MG MT LOZG: 1.0000 | LOZENGE | OROMUCOSAL | Status: DC | PRN

## 2018-03-04 MED ORDER — DOCUSATE SODIUM 100 MG PO CAPS
100.0000 mg | ORAL_CAPSULE | Freq: Two times a day (BID) | ORAL | Status: DC
  Administered 2018-03-04 – 2018-03-09 (×10): 100 mg via ORAL
  Filled 2018-03-04 (×10): qty 1

## 2018-03-04 MED ORDER — EPHEDRINE SULFATE 50 MG/ML IJ SOLN
INTRAMUSCULAR | Status: AC
  Filled 2018-03-04: qty 2

## 2018-03-04 MED ORDER — SUCCINYLCHOLINE CHLORIDE 200 MG/10ML IV SOSY
PREFILLED_SYRINGE | INTRAVENOUS | Status: AC
  Filled 2018-03-04: qty 10

## 2018-03-04 MED ORDER — BUPIVACAINE-EPINEPHRINE 0.5% -1:200000 IJ SOLN
INTRAMUSCULAR | Status: DC | PRN
  Administered 2018-03-04: 12 mL

## 2018-03-04 MED ORDER — SUGAMMADEX SODIUM 200 MG/2ML IV SOLN
INTRAVENOUS | Status: DC | PRN
  Administered 2018-03-04: 200 mg via INTRAVENOUS

## 2018-03-04 MED ORDER — PHENYLEPHRINE 40 MCG/ML (10ML) SYRINGE FOR IV PUSH (FOR BLOOD PRESSURE SUPPORT)
PREFILLED_SYRINGE | INTRAVENOUS | Status: AC
  Filled 2018-03-04: qty 10

## 2018-03-04 MED ORDER — PROMETHAZINE HCL 25 MG/ML IJ SOLN: 6.2500 mg | INTRAMUSCULAR | Status: DC | PRN

## 2018-03-04 MED ORDER — GABAPENTIN 300 MG PO CAPS
300.0000 mg | ORAL_CAPSULE | Freq: Three times a day (TID) | ORAL | Status: DC
Start: 2018-03-04 — End: 2018-03-09
  Administered 2018-03-04 – 2018-03-09 (×14): 300 mg via ORAL
  Filled 2018-03-04 (×14): qty 1

## 2018-03-04 MED ORDER — LORAZEPAM 0.5 MG PO TABS: 0.5000 mg | ORAL_TABLET | Freq: Three times a day (TID) | ORAL | Status: DC | PRN

## 2018-03-04 MED ORDER — BISACODYL 5 MG PO TBEC: 5.0000 mg | DELAYED_RELEASE_TABLET | Freq: Every day | ORAL | Status: DC | PRN

## 2018-03-04 MED ORDER — METHOCARBAMOL 500 MG PO TABS: 500.0000 mg | ORAL_TABLET | Freq: Four times a day (QID) | ORAL | 1 refills | Status: DC | PRN

## 2018-03-04 MED ORDER — FENTANYL CITRATE (PF) 100 MCG/2ML IJ SOLN
INTRAMUSCULAR | Status: DC | PRN
  Administered 2018-03-04: 100 ug via INTRAVENOUS
  Administered 2018-03-04: 50 ug via INTRAVENOUS
  Administered 2018-03-04: 25 ug via INTRAVENOUS

## 2018-03-04 MED ORDER — BUPIVACAINE-EPINEPHRINE (PF) 0.5% -1:200000 IJ SOLN
INTRAMUSCULAR | Status: AC
  Filled 2018-03-04: qty 30

## 2018-03-04 MED ORDER — THROMBIN (RECOMBINANT) 20000 UNITS EX SOLR
CUTANEOUS | Status: DC | PRN
  Administered 2018-03-04: 09:00:00 via TOPICAL

## 2018-03-04 MED ORDER — ACETAMINOPHEN 325 MG PO TABS
650.0000 mg | ORAL_TABLET | ORAL | Status: DC | PRN
  Filled 2018-03-04 (×2): qty 2

## 2018-03-04 MED ORDER — PHENOL 1.4 % MT LIQD: 1.0000 | OROMUCOSAL | Status: DC | PRN

## 2018-03-04 MED ORDER — DEXAMETHASONE SODIUM PHOSPHATE 10 MG/ML IJ SOLN
INTRAMUSCULAR | Status: DC | PRN
  Administered 2018-03-04: 10 mg via INTRAVENOUS

## 2018-03-04 MED ORDER — SODIUM CHLORIDE 0.9 % IV SOLN
INTRAVENOUS | Status: DC | PRN
  Administered 2018-03-04: 14:00:00

## 2018-03-04 MED ORDER — SUGAMMADEX SODIUM 200 MG/2ML IV SOLN
INTRAVENOUS | Status: AC
  Filled 2018-03-04: qty 2

## 2018-03-04 MED ORDER — LACTATED RINGERS IV SOLN
INTRAVENOUS | Status: DC
  Administered 2018-03-04 (×2): via INTRAVENOUS

## 2018-03-04 MED ORDER — EPHEDRINE SULFATE 50 MG/ML IJ SOLN
INTRAMUSCULAR | Status: DC | PRN
  Administered 2018-03-04: 5 mg via INTRAVENOUS

## 2018-03-04 MED ORDER — MAGNESIUM CITRATE PO SOLN
1.0000 | Freq: Once | ORAL | Status: AC | PRN
  Administered 2018-03-08: 1 via ORAL
  Filled 2018-03-04: qty 296

## 2018-03-04 MED ORDER — OXYCODONE HCL 5 MG PO TABS
5.0000 mg | ORAL_TABLET | ORAL | Status: DC | PRN
  Administered 2018-03-04 – 2018-03-09 (×6): 5 mg via ORAL
  Filled 2018-03-04 (×5): qty 1

## 2018-03-04 MED ORDER — DOCUSATE SODIUM 100 MG PO CAPS: 100.0000 mg | ORAL_CAPSULE | Freq: Two times a day (BID) | ORAL | 1 refills | Status: DC

## 2018-03-04 MED ORDER — KCL IN DEXTROSE-NACL 20-5-0.45 MEQ/L-%-% IV SOLN
INTRAVENOUS | Status: AC
  Administered 2018-03-04: 20:00:00 via INTRAVENOUS

## 2018-03-04 MED ORDER — PHENYLEPHRINE HCL 10 MG/ML IJ SOLN
INTRAVENOUS | Status: DC | PRN
  Administered 2018-03-04: 25 ug/min via INTRAVENOUS

## 2018-03-04 MED ORDER — DIPHENHYDRAMINE HCL 25 MG PO CAPS
50.0000 mg | ORAL_CAPSULE | Freq: Four times a day (QID) | ORAL | Status: DC | PRN
  Administered 2018-03-04 – 2018-03-07 (×7): 50 mg via ORAL
  Filled 2018-03-04 (×7): qty 2

## 2018-03-04 MED ORDER — HYDROMORPHONE HCL 2 MG/ML IJ SOLN
0.5000 mg | INTRAMUSCULAR | Status: DC | PRN
  Filled 2018-03-04: qty 1

## 2018-03-04 MED ORDER — POLYETHYLENE GLYCOL 3350 17 G PO PACK: 17.0000 g | PACK | Freq: Every day | ORAL | 0 refills | Status: DC

## 2018-03-04 MED ORDER — ARTIFICIAL TEARS OPHTHALMIC OINT
TOPICAL_OINTMENT | OPHTHALMIC | Status: AC
  Filled 2018-03-04: qty 3.5

## 2018-03-04 MED ORDER — 0.9 % SODIUM CHLORIDE (POUR BTL) OPTIME
TOPICAL | Status: DC | PRN
  Administered 2018-03-04: 1000 mL

## 2018-03-04 MED ORDER — METHOCARBAMOL 1000 MG/10ML IJ SOLN
500.0000 mg | Freq: Four times a day (QID) | INTRAVENOUS | Status: DC | PRN
  Filled 2018-03-04: qty 5

## 2018-03-04 MED ORDER — KCL IN DEXTROSE-NACL 20-5-0.45 MEQ/L-%-% IV SOLN
INTRAVENOUS | Status: AC
  Filled 2018-03-04: qty 1000

## 2018-03-04 MED ORDER — SODIUM CHLORIDE 0.9 % IJ SOLN
INTRAMUSCULAR | Status: AC
Start: 2018-03-04 — End: ?
  Filled 2018-03-04: qty 10

## 2018-03-04 MED ORDER — HYDROMORPHONE HCL 2 MG/ML IJ SOLN
0.2500 mg | INTRAMUSCULAR | Status: DC | PRN
  Administered 2018-03-04 (×3): 0.5 mg via INTRAVENOUS

## 2018-03-04 MED ORDER — BUPIVACAINE-EPINEPHRINE (PF) 0.5% -1:200000 IJ SOLN
INTRAMUSCULAR | Status: AC
Start: 2018-03-04 — End: ?
  Filled 2018-03-04: qty 30

## 2018-03-04 MED ORDER — OXYCODONE HCL 5 MG PO TABS
10.0000 mg | ORAL_TABLET | ORAL | Status: DC | PRN
  Administered 2018-03-04 – 2018-03-09 (×9): 10 mg via ORAL
  Filled 2018-03-04 (×9): qty 2

## 2018-03-04 MED ORDER — RISAQUAD PO CAPS
1.0000 | ORAL_CAPSULE | Freq: Every day | ORAL | Status: DC
  Administered 2018-03-05 – 2018-03-09 (×5): 1 via ORAL
  Filled 2018-03-04 (×5): qty 1

## 2018-03-04 MED ORDER — ALUM & MAG HYDROXIDE-SIMETH 200-200-20 MG/5ML PO SUSP: 30.0000 mL | Freq: Four times a day (QID) | ORAL | Status: DC | PRN

## 2018-03-04 MED ORDER — SCOPOLAMINE 1 MG/3DAYS TD PT72
MEDICATED_PATCH | TRANSDERMAL | Status: AC
  Administered 2018-03-04: 1.5 mg via TRANSDERMAL
  Filled 2018-03-04: qty 1

## 2018-03-04 MED ORDER — ACETAMINOPHEN 10 MG/ML IV SOLN
1000.0000 mg | INTRAVENOUS | Status: AC
  Administered 2018-03-04: 1000 mg via INTRAVENOUS
  Filled 2018-03-04: qty 100

## 2018-03-04 MED ORDER — ACETAMINOPHEN 650 MG RE SUPP: 650.0000 mg | RECTAL | Status: DC | PRN

## 2018-03-04 SURGICAL SUPPLY — 52 items
BAG DECANTER FOR FLEXI CONT (MISCELLANEOUS) ×2 IMPLANT
CLEANER TIP ELECTROSURG 2X2 (MISCELLANEOUS) ×2 IMPLANT
CLOTH 2% CHLOROHEXIDINE 3PK (PERSONAL CARE ITEMS) ×2 IMPLANT
CONT SPEC 4OZ CLIKSEAL STRL BL (MISCELLANEOUS) ×2 IMPLANT
DRAPE LAPAROTOMY 100X72X124 (DRAPES) ×2 IMPLANT
DRAPE MICROSCOPE LEICA (MISCELLANEOUS) ×2 IMPLANT
DRAPE SHEET LG 3/4 BI-LAMINATE (DRAPES) ×2 IMPLANT
DRAPE SURG 17X11 SM STRL (DRAPES) ×2 IMPLANT
DRAPE UTILITY XL STRL (DRAPES) ×2 IMPLANT
DRSG AQUACEL AG ADV 3.5X 4 (GAUZE/BANDAGES/DRESSINGS) ×1 IMPLANT
DRSG AQUACEL AG ADV 3.5X 6 (GAUZE/BANDAGES/DRESSINGS) IMPLANT
DRSG TELFA 3X8 NADH (GAUZE/BANDAGES/DRESSINGS) IMPLANT
DURAPREP 26ML APPLICATOR (WOUND CARE) ×2 IMPLANT
DURASEAL SPINE SEALANT 3ML (MISCELLANEOUS) IMPLANT
ELECT BLADE 4.0 EZ CLEAN MEGAD (MISCELLANEOUS) ×2
ELECT REM PT RETURN 9FT ADLT (ELECTROSURGICAL) ×2
ELECTRODE BLDE 4.0 EZ CLN MEGD (MISCELLANEOUS) IMPLANT
ELECTRODE REM PT RTRN 9FT ADLT (ELECTROSURGICAL) ×1 IMPLANT
GLOVE BIOGEL PI IND STRL 7.0 (GLOVE) ×1 IMPLANT
GLOVE BIOGEL PI INDICATOR 7.0 (GLOVE) ×1
GLOVE SURG SS PI 7.5 STRL IVOR (GLOVE) ×2 IMPLANT
GLOVE SURG SS PI 8.0 STRL IVOR (GLOVE) ×4 IMPLANT
GOWN STRL REUS W/ TWL LRG LVL3 (GOWN DISPOSABLE) ×1 IMPLANT
GOWN STRL REUS W/ TWL XL LVL3 (GOWN DISPOSABLE) ×1 IMPLANT
GOWN STRL REUS W/TWL LRG LVL3 (GOWN DISPOSABLE) ×2
GOWN STRL REUS W/TWL XL LVL3 (GOWN DISPOSABLE) ×4
IV CATH 14GX2 1/4 (CATHETERS) ×2 IMPLANT
KIT BASIN OR (CUSTOM PROCEDURE TRAY) ×2 IMPLANT
NDL SPNL 18GX3.5 QUINCKE PK (NEEDLE) ×2 IMPLANT
NEEDLE 22X1 1/2 (OR ONLY) (NEEDLE) ×2 IMPLANT
NEEDLE SPNL 18GX3.5 QUINCKE PK (NEEDLE) ×4 IMPLANT
PACK LAMINECTOMY NEURO (CUSTOM PROCEDURE TRAY) ×2 IMPLANT
PAD DRESSING TELFA 3X8 NADH (GAUZE/BANDAGES/DRESSINGS) IMPLANT
PATTIES SURGICAL .75X.75 (GAUZE/BANDAGES/DRESSINGS) IMPLANT
RUBBERBAND STERILE (MISCELLANEOUS) ×4 IMPLANT
SPONGE LAP 4X18 X RAY DECT (DISPOSABLE) IMPLANT
SPONGE SURGIFOAM ABS GEL 100 (HEMOSTASIS) ×2 IMPLANT
STAPLER VISISTAT (STAPLE) IMPLANT
STRIP CLOSURE SKIN 1/2X4 (GAUZE/BANDAGES/DRESSINGS) ×2 IMPLANT
SUT NURALON 4 0 TR CR/8 (SUTURE) IMPLANT
SUT PROLENE 3 0 PS 2 (SUTURE) ×1 IMPLANT
SUT VIC AB 1 CT1 27 (SUTURE) ×2
SUT VIC AB 1 CT1 27XBRD ANBCTR (SUTURE) IMPLANT
SUT VIC AB 1 CT1 27XBRD ANTBC (SUTURE) IMPLANT
SUT VIC AB 1-0 CT2 27 (SUTURE) IMPLANT
SUT VIC AB 2-0 CT1 27 (SUTURE)
SUT VIC AB 2-0 CT1 TAPERPNT 27 (SUTURE) IMPLANT
SUT VIC AB 2-0 CT2 27 (SUTURE) ×1 IMPLANT
SYR 3ML LL SCALE MARK (SYRINGE) ×2 IMPLANT
TOWEL GREEN STERILE (TOWEL DISPOSABLE) ×2 IMPLANT
TOWEL GREEN STERILE FF (TOWEL DISPOSABLE) ×2 IMPLANT
YANKAUER SUCT BULB TIP NO VENT (SUCTIONS) ×2 IMPLANT

## 2018-03-04 SURGICAL SUPPLY — 61 items
BAG DECANTER FOR FLEXI CONT (MISCELLANEOUS) ×2 IMPLANT
CLEANER TIP ELECTROSURG 2X2 (MISCELLANEOUS) ×2 IMPLANT
CLOTH 2% CHLOROHEXIDINE 3PK (PERSONAL CARE ITEMS) ×2 IMPLANT
CONT SPEC 4OZ CLIKSEAL STRL BL (MISCELLANEOUS) ×2 IMPLANT
DRAPE LAPAROTOMY 100X72X124 (DRAPES) ×2 IMPLANT
DRAPE MICROSCOPE LEICA (MISCELLANEOUS) ×2 IMPLANT
DRAPE SHEET LG 3/4 BI-LAMINATE (DRAPES) ×2 IMPLANT
DRAPE SURG 17X11 SM STRL (DRAPES) ×2 IMPLANT
DRAPE UTILITY XL STRL (DRAPES) ×2 IMPLANT
DRSG AQUACEL AG ADV 3.5X 4 (GAUZE/BANDAGES/DRESSINGS) IMPLANT
DRSG AQUACEL AG ADV 3.5X 6 (GAUZE/BANDAGES/DRESSINGS) ×1 IMPLANT
DRSG TEGADERM 4X4.75 (GAUZE/BANDAGES/DRESSINGS) ×1 IMPLANT
DRSG TELFA 3X8 NADH (GAUZE/BANDAGES/DRESSINGS) IMPLANT
DURAPREP 26ML APPLICATOR (WOUND CARE) ×2 IMPLANT
DURASEAL SPINE SEALANT 3ML (MISCELLANEOUS) IMPLANT
ELECT BLADE 4.0 EZ CLEAN MEGAD (MISCELLANEOUS) ×2
ELECT REM PT RETURN 9FT ADLT (ELECTROSURGICAL) ×2
ELECTRODE BLDE 4.0 EZ CLN MEGD (MISCELLANEOUS) IMPLANT
ELECTRODE REM PT RTRN 9FT ADLT (ELECTROSURGICAL) ×1 IMPLANT
GAUZE SPONGE 4X4 12PLY STRL LF (GAUZE/BANDAGES/DRESSINGS) ×1 IMPLANT
GLOVE BIOGEL PI IND STRL 7.0 (GLOVE) ×1 IMPLANT
GLOVE BIOGEL PI IND STRL 8 (GLOVE) IMPLANT
GLOVE BIOGEL PI INDICATOR 7.0 (GLOVE) ×1
GLOVE BIOGEL PI INDICATOR 8 (GLOVE) ×3
GLOVE SS N UNI LF 7.5 STRL (GLOVE) ×3 IMPLANT
GLOVE SURG SS PI 7.5 STRL IVOR (GLOVE) ×2 IMPLANT
GLOVE SURG SS PI 8.0 STRL IVOR (GLOVE) ×4 IMPLANT
GOWN EXTRA PROTECTION XL (GOWNS) ×2 IMPLANT
GOWN SPEC L3 XXLG W/TWL (GOWN DISPOSABLE) ×2 IMPLANT
GOWN STRL REUS W/ TWL LRG LVL3 (GOWN DISPOSABLE) ×1 IMPLANT
GOWN STRL REUS W/ TWL XL LVL3 (GOWN DISPOSABLE) ×1 IMPLANT
GOWN STRL REUS W/TWL LRG LVL3 (GOWN DISPOSABLE) ×2
GOWN STRL REUS W/TWL XL LVL3 (GOWN DISPOSABLE) ×2
IV CATH 14GX2 1/4 (CATHETERS) ×2 IMPLANT
KIT BASIN OR (CUSTOM PROCEDURE TRAY) ×2 IMPLANT
KIT SUTURE REMOVAL HAMOT (SET/KITS/TRAYS/PACK) ×1 IMPLANT
KIT TURNOVER KIT B (KITS) ×2 IMPLANT
NDL SPNL 18GX3.5 QUINCKE PK (NEEDLE) ×2 IMPLANT
NEEDLE 22X1 1/2 (OR ONLY) (NEEDLE) ×2 IMPLANT
NEEDLE SPNL 18GX3.5 QUINCKE PK (NEEDLE) ×4 IMPLANT
PACK LAMINECTOMY NEURO (CUSTOM PROCEDURE TRAY) ×2 IMPLANT
PAD ARMBOARD 7.5X6 YLW CONV (MISCELLANEOUS) ×4 IMPLANT
PAD DRESSING TELFA 3X8 NADH (GAUZE/BANDAGES/DRESSINGS) IMPLANT
PATTIES SURGICAL .75X.75 (GAUZE/BANDAGES/DRESSINGS) ×1 IMPLANT
RUBBERBAND STERILE (MISCELLANEOUS) ×4 IMPLANT
SPONGE LAP 4X18 X RAY DECT (DISPOSABLE) IMPLANT
SPONGE SURGIFOAM ABS GEL 100 (HEMOSTASIS) ×2 IMPLANT
STAPLER VISISTAT (STAPLE) IMPLANT
STRIP CLOSURE SKIN 1/2X4 (GAUZE/BANDAGES/DRESSINGS) ×2 IMPLANT
SUT NURALON 4 0 TR CR/8 (SUTURE) IMPLANT
SUT PROLENE 3 0 PS 2 (SUTURE) ×1 IMPLANT
SUT VIC AB 1 CT1 27 (SUTURE) ×4
SUT VIC AB 1 CT1 27XBRD ANTBC (SUTURE) IMPLANT
SUT VIC AB 1-0 CT2 27 (SUTURE) IMPLANT
SUT VIC AB 2-0 CT1 27 (SUTURE)
SUT VIC AB 2-0 CT1 TAPERPNT 27 (SUTURE) IMPLANT
SUT VIC AB 2-0 CT2 27 (SUTURE) ×1 IMPLANT
SYR 3ML LL SCALE MARK (SYRINGE) ×2 IMPLANT
TOWEL GREEN STERILE (TOWEL DISPOSABLE) ×2 IMPLANT
TOWEL GREEN STERILE FF (TOWEL DISPOSABLE) ×2 IMPLANT
YANKAUER SUCT BULB TIP NO VENT (SUCTIONS) ×2 IMPLANT

## 2018-03-04 NOTE — Op Note (Signed)
NAME: [name], TULL MEDICAL RECORD [id number] ACCOUNT [id number] DATE OF BIRTH:04-Aug-1961 FACILITY: [facility] LOCATION: [facility] PHYSICIAN:Shania Bjelland Windy Kalata, MD  OPERATIVE REPORT  DATE OF PROCEDURE:  03/04/2018  PREOPERATIVE DIAGNOSES:  Recurrent spinal stenosis at L4-5.  POSTOPERATIVE DIAGNOSES:  Recurrent spinal stenosis at L4-5.  PROCEDURE PERFORMED: 1.  Revision bilateral hemilaminotomies L4-5. 2.  Foraminotomies L4-L5.  ANESTHESIA:  General.  ASSISTANT:  Lacie Draft, PA  Technical difficulty increased due to the patient's epidural fibrosis.  HISTORY AND INDICATIONS:  This is a pleasant 57 year old female with bilateral radicular pain L5 nerve root distribution secondary to facet and ligamentum flavum hypertrophy and spinal stenosis.  She had a history of a previous decompression of the  lumbar spine, another at 4, 5 or at 5, 1.  She had some disk space narrowing but had mainly radicular pain as opposed to back pain.  There was no instability noted.  She had temporary relief from an epidural steroid injection.  She exhausted physical  therapy.  She had neural tension signs and EHL weakness.  She was indicated for decompression at 4, 5 revision and felt foraminotomies and decompressing the lateral recess would decompress the 5 nerve root.  Risks and benefits discussed including  bleeding, infection, damage to neurovascular structures, no changes in symptoms, worsening symptoms, DVT, PE, and anesthetic complication, need for fusion in the future, etc.  TECHNIQUE:  The patient in supine position.  After induction of adequate general anesthesia and 1 g vancomycin due to a PENICILLIN ALLERGY, she was placed prone on the Wilson frame.  All bony prominences were well padded.  Lumbar region was prepped and  draped in the usual sterile fashion.  Two 18-gauge spinal needles were utilized to localize the previous 4, 5 interspace.  This was confirmed with x-ray.  Incision was made from  her previous surgical incision, extending a centimeter above and below.   Subcutaneous tissue was dissected.  Electrocautery was utilized to achieve hemostasis.  Dorsal lumbar fascia was identified.  Marcaine 0.25% with epinephrine was infiltrated in the paraspinous musculature.  We incised on either side of the dorsal lumbar  fascia with a separate fascial incision.  Paraspinous muscles elevated from the lamina of 4, 5.  A McCulloch retractor was placed.  Operating microscope was draped, brought into the surgical field.  Confirmatory radiograph obtained.  Attention was turned  first towards the left.  She had fairly extensive scar tissue noted.  We meticulously skeletonized the previous laminotomy, identifying the lamina of 4 and the lamina of 5 as well as the facet.  We developed a plane between the previous scar tissue and  the dura with a micro curette, developing a plane with a Woodson cephalad, performed hemilaminotomy of the caudad edge of 4.  This ____.  Following this, we then, in a similar fashion, mobilized the scar tissue attached to the previous lamina distally of  5 with micro curette.  Once we had mobilized that and developed a plane with a Penfield and protected the nerve root, we performed a generous foraminotomy of 5 with 2 mm Kerrison followed by a 3 mm Kerrison.  There were extensive adhesions noted here of  the 5 root into the lateral recess.  Care was taken to not manipulate the nerve root.  Once foraminotomy was performed, we mobilized the nerve root, protected it, decompressed the lateral recess to the medial border of the pedicle.  Continued cephalad.   This was along the superior articulating process of 5, developing a plane  between it and the thecal sac.  We removed a portion of the superior articulating facet up to the foramen of 4.  It was slightly stenotic as well.  Utilizing a curette to remove  this as well, protecting the nerve root at all times.  There was epidural venous  plexus noted.  No disk herniation was noted at the disk space.  Following this, a neuro probe passed freely out the foramen of 4 and 5.  Initially, the operating microscope was brought out of the surgical field to perform some maintenance and cleansing of the oculars.  Following the confirmation at this level with a Woodson in the foramen of 5, no active bleeding with 1 cm of excursion of the 5 root medially.  The pedicle without tension.  Copiously irrigated the wound.  No evidence of CSF leakage or active bleeding.   I placed thrombin-soaked Gelfoam over that.  Then, from the outside of the operating room table, then skeletonized the previous hemilaminotomy at 5 on the right.  This previous decompression was bilateral, identifying the lamina of 4 and of 5 as well.   In a similar fashion, developing a plane between the epidural fibrosis and the lamina, enlarging hemilaminotomy cephalad with a 2 and a 3 mm Kerrison, preserving the pars in a similar fashion, identifying the superior articulating process and a very  small interlaminar window and a fairly hypertrophic facet here.  Identifying the medial aspect of the superior articulating process was challenging.  This was finally isolated.  Nerve root protected.  Used a 2 mm Kerrison to decompress the lateral recess  and the medial border of the pedicle, protecting the 5 root.  Continued and performed a generous foraminotomy of 5 as well, and with the neural elements protected laterally after developing a plane between the thecal sac and the facet, we decompressed  the lateral recess to the medial border of the pedicle,  removing a small spur of the superior articulating process of 5, extending into the foramen of 4.  Protecting the nerve root and epidural venous plexus was noted, and thrombin-soaked Gelfoam was  utilized to contain this as well as limited bipolar electrocautery.  After a neuro probe passed freely out the foramen of 4 and 5, we checked the  disk space.  No evidence of disk herniation.  A 1 cm of excursion of the 5 root medially, the pedicle  without tension.  No CSF leakage or active bleeding.  A Woodson probe passed freely out both foramen.  Following this, we examined the pars, which was intact.  We copiously irrigated the wound, closed both separate fascial incision with #1 Vicryl  interrupted figure-of-eight sutures, subcu with 2-0 and skin with Prolene.  Sterile dressing applied and placed supine on the hospital bed, extubated without difficulty and transported to the recovery room in satisfactory condition.  The patient tolerated the procedure well.  No complications.  Assistant:  Lacie Draft, PA.  Minimal blood loss.  LN/NUANCE  D:03/04/2018 T:03/04/2018 JOB:[id number]

## 2018-03-04 NOTE — Progress Notes (Signed)
Dr Tonita Cong here to examine pt. She is not able to dorsi or plantar flex. her feet, crying, "I can't feel my legs".

## 2018-03-04 NOTE — Anesthesia Preprocedure Evaluation (Addendum)
Anesthesia Evaluation  Patient identified by MRN, date of birth, ID band Patient awake    Reviewed: Allergy & Precautions, NPO status , Patient's Chart, lab work & pertinent test results, reviewed documented beta blocker date and time Preop documentation limited or incomplete due to emergent nature of procedure.  History of Anesthesia Complications (+) PONV  Airway Mallampati: I   Neck ROM: Full    Dental  (+) Dental Advisory Given   Pulmonary    breath sounds clear to auscultation       Cardiovascular hypertension, Pt. on home beta blockers and Pt. on medications  Rhythm:Regular Rate:Normal     Neuro/Psych  Headaches, Anxiety Chronic back pain S/p back surgery today, new sensory deficit bilateral lower extrem    GI/Hepatic negative GI ROS, Neg liver ROS,   Endo/Other  negative endocrine ROS  Renal/GU negative Renal ROS     Musculoskeletal   Abdominal   Peds  Hematology negative hematology ROS (+)   Anesthesia Other Findings   Reproductive/Obstetrics                             Anesthesia Physical Anesthesia Plan  ASA: II and emergent  Anesthesia Plan:    Post-op Pain Management:    Induction: Intravenous  PONV Risk Score and Plan: 4 or greater and Ondansetron, Dexamethasone and Scopolamine patch - Pre-op  Airway Management Planned: Oral ETT  Additional Equipment:   Intra-op Plan:   Post-operative Plan: Extubation in OR  Informed Consent: I have reviewed the patients History and Physical, chart, labs and discussed the procedure including the risks, benefits and alternatives for the proposed anesthesia with the patient or authorized representative who has indicated his/her understanding and acceptance.   Dental advisory given  Plan Discussed with: CRNA and Surgeon  Anesthesia Plan Comments: (Plan routine monitors, GETA)        Anesthesia Quick Evaluation

## 2018-03-04 NOTE — Progress Notes (Signed)
Pharmacy Antibiotic Note  [name] is a 57 y.o. female admitted on 03/04/2018 with surgical prophylaxis.  Pharmacy has been consulted for Vancomycin dosing.  Plan: Vancomycin 1000 mg IV every 12 hours.  Goal trough 10-15 mcg/mL. Continue while hemovac drain in place.     Temp (24hrs), Avg:97.6 F (36.4 C), Min:97 F (36.1 C), Max:97.9 F (36.6 C)  No results for input(s): WBC, CREATININE, LATICACIDVEN, VANCOTROUGH, VANCOPEAK, VANCORANDOM, GENTTROUGH, GENTPEAK, GENTRANDOM, TOBRATROUGH, TOBRAPEAK, TOBRARND, AMIKACINPEAK, AMIKACINTROU, AMIKACIN in the last 168 hours.  Estimated Creatinine Clearance: 57.1 mL/min (by C-G formula based on SCr of 0.99 mg/dL).    Allergies  Allergen Reactions  . Cephalosporins Anaphylaxis  . Penicillins Anaphylaxis and Hives    Has patient had a PCN reaction causing immediate rash, facial/tongue/throat swelling, SOB or lightheadedness with hypotension: Yes Has patient had a PCN reaction causing severe rash involving mucus membranes or skin necrosis: Yes Has patient had a PCN reaction that required hospitalization Yes Has patient had a PCN reaction occurring within the last 10 years: No If all of the above answers are "NO", then may proceed with Cephalosporin use.   . Anesthetics, Amide Nausea And Vomiting    Does not know name of it. States they put it on record foot center.   [name] Peach [Prunus Persica] Hives  . Latex Hives, Itching and Rash    Antimicrobials this admission: Vancomycin 5/9 >>   Thank you for allowing pharmacy to be a part of this patient's care.  Corinda Gubler 03/04/2018 9:09 PM

## 2018-03-04 NOTE — Progress Notes (Signed)
Postop Exam 11AM  Patient initially DF and PF in PACU though When fully awake reported no sensation from Waist distal. Unable to move legs. Perineal sensation Intact.Able to feel foley.  Patient able to SLR. Not DF or PF. Reported decreased sensation L2, L3, L4, L5, S1.  Discussed with Dr. Arnoldo Morale. Unclear presentation. Possible hematoma compression. Recommend exploration.  Discussed with Husband and patient. Return to OR.

## 2018-03-04 NOTE — Progress Notes (Signed)
Spoke with patient 10:15 PM  Left has returned to normal. Right slightly better But still decreased sensation and motor.  Benadryl for itching.

## 2018-03-04 NOTE — Progress Notes (Signed)
Family members at bedside with Dr Tonita Cong.

## 2018-03-04 NOTE — Brief Op Note (Signed)
03/04/2018  8:13 PM  PATIENT:  [name]  57 y.o. female  PRE-OPERATIVE DIAGNOSIS:  Spinal stenosis L4-5  POST-OPERATIVE DIAGNOSIS:  Spinal stenosis L4-5  PROCEDURE:  Procedure(s) with comments: Revision of Microlumbar Decompression Bilateral Lumbar Four-Five (Bilateral) - 90 mins  SURGEON:  Surgeon(s) and Role:    [name], MD - Primary  PHYSICIAN ASSISTANT:   ASSISTANTS: Bissell   ANESTHESIA:   general  EBL:  100 mL   BLOOD ADMINISTERED:none  DRAINS: none   LOCAL MEDICATIONS USED:  MARCAINE     SPECIMEN:  No Specimen  DISPOSITION OF SPECIMEN:  N/A  COUNTS:  YES  TOURNIQUET:  * No tourniquets in log *  DICTATION: .Other Dictation: Dictation Number [id number]  PLAN OF CARE: Admit to inpatient   PATIENT DISPOSITION:  PACU - hemodynamically stable.   Delay start of Pharmacological VTE agent (>24hrs) due to surgical blood loss or risk of bleeding: yes

## 2018-03-04 NOTE — Op Note (Signed)
NAME: [name], [name] MEDICAL RECORD QI:[id number] ACCOUNT [id number] DATE OF BIRTH:03-Mar-1961 FACILITY: [facility] LOCATION: [facility] PHYSICIAN:Elmon Shader Windy Kalata, MD  OPERATIVE REPORT  DATE OF PROCEDURE:  03/04/2018  PREOPERATIVE DIAGNOSES:  Postoperative hematoma.  POSTOPERATIVE DIAGNOSES:  Postoperative hematoma, central spinal stenosis.  PROCEDURES PERFORMED: Central decompression at L4-5 with removal of partial spinous process of 4 and 5.  Exploration of the wound, evacuation of small hematoma.  ANESTHESIA:  General.  ASSISTANT:  Lacie Draft, PA   HISTORY:  A 57 year old female who was just status post a bilateral lumbar decompression at L4-5.  The patient was found postoperatively to initially by nursing have the dorsiflexion and plantarflexion of the feet.  She then subsequent to that was  insensate from the waist to below bilaterally.  Shedid not dorsiflexion or plantarflex.  She could straighten and raise her legs.  She was fairly rigid.  We initially evaluated her and felt that it was appropriate to revisit when she was further  awake.  We continued to examine her, and she did not have improvement in her neurologic status.  I then discussed this case with Dr. Earle Gell.  She had diminished sensation in all of the lumbar dermatomes, including L2, L3, L1 in addition to the lower extremities.Perineal sensation intact.  Upper extremity examination revealed that she was not myelopathic.  She had no  neck pain.  She had good grip strength and upper extremity strength.  She had a perineal sensation that was intact.  Could feel her Foley.  We felt it was appropriate to return her to the operating room for exploration of the wound that perhaps she had   a hematoma as her presentation was unclear as it involved levels above the operative level. I discussed this with her husband and the patient as well indicating without improvement in her neurologic function, it was best to explore it.   Risks and benefits discussed including bleeding, infection.  Again, no change in her  symptoms, etc.  The patient was then taken back to the operating room.  She underwent general anesthesia, was placed on the Wilson frame.  All bony prominences were well padded.  Foley to gravity.  The lumbar region was prepped and draped in the usual sterile fashion after  I removed her previous dressing and the sutures.  I then opened the wound and removed the subcutaneous tissue.  There was some amount of blood.  It was not under pressure or excessive.  I extended the incision cephalad and caudad.  Paraspinous muscles  elevated from the lamina of 4 and 5.  A McCulloch retractor was placed.  I removed the spinous process of 4 and of 5 and then started centrally, feeling that I would evaluate the lateral recess from the previous laminotomies.  Some blood was noted there,  but again not excessive.  I then felt it was appropriate to explore it centrally.  I removed again the spinous processes.  A micro curette was utilized to detach the epidural fibrosis and the ligamentum flavum from the caudad edge of 4.  I used a 2 mm  Kerrison after developing the plane cephalad, the micro curette and continued cephalad, detaching the ligamentum flavum.  This continued cephalad to the detachment of the ligamentum flavum that formed increase of the laminotomies bilaterally.  This  continued cephalad.  I felt it was a thorough decompression.  Then, distally a straight curette was utilized to detach the ligamentum flavum from the cephalad edge of  5.  Then I expanded the laminotomies centrally at L5, extending bilaterally to get beneath  the region of the central ligamentum, both sides of the operating room table.  Removal of central ligamentum flavum and extended residual laterally and extending the laminotomy distally. I extended  the foraminotomies bilaterally.  The patient  had some significant epidural fibrosis bilaterally.  Into the  lateral recess, we were able to mobilize the thecal sac.  There was one area of residual ligamentum flavum, very small, attaching to the thecal sac.  It was very adherent on the right. Did not appreciate a herniate disc with probing Or see one on the left.  I debulked it with  pickups and a 15 blade to remove the mass effect.  I felt, though, that this was very adherent over on the right-hand side, and after attempting to free it from the laminotomy with a micro curette and a Woodson, I felt any further manipulation would risk  a dural rent, though following this, the neuro probe passed freely out the foramen of 5 and 4 bilaterally.  No compression was noted.  Good restoration of the thecal sac was noted.The L5 and L4 nerve roots were intact.  Then put bone wax on the cancellous surfaces.  Temporary  thrombin-soaked Gelfoam and bipolar cautery was utilized to achieve hemostasis.  Confirmatory radiograph obtained at the level, and I reexamined.  The [facility] retractor was 2 above the pedicle of 4 and below the pedicle of 5.  The thecal sac was mobile.   No active bleeding or CSF leakage.  No compression was noted.  Next, after this, I copiously irrigated the wound with antibiotic irrigation, removed the McCulloch retractor.  Paraspinous muscles were cauterized.  Repaired the dorsal fascia with 1 Vicryl.  Placed a Hemovac.  Brought out through a lateral stab wound  in the skin.  I did not charge it.  Subcu with 2 and skin with staples.  We slid the Hemovac.  It was not incarcerated.  The patient tolerated the procedure well.  No complications.  A sterile dressing applied.  Placed supine on the hospital bed with the knees flexed and transported to the recovery room in satisfactory condition.  The patient tolerated the procedure well.   No complications.  Blood loss 100 mL.    Post operative check in the PACU 30 minutes later revealed. Both legs improving left greater than right with motor L5, S1 and L4  on the Left 3/5 and L4 on right and sensory all dermatomes on left and L2 through L3 on the right.  With improving neurologic exam plan admit for continued observation And IV steroids. Imaging only if patient did not continue to improve.   LN/NUANCE  D:03/04/2018 T:03/04/2018 JOB:[id number]

## 2018-03-04 NOTE — Transfer of Care (Signed)
Immediate Anesthesia Transfer of Care Note  Patient: [name]  Procedure(s) Performed: EXPLORATION OF LUMBAR DECOMPRESSION WOUND (N/A Spine Lumbar)  Patient Location: PACU  Anesthesia Type:General  Level of Consciousness: patient cooperative and responds to stimulation  Airway & Oxygen Therapy: Patient Spontanous Breathing and Patient connected to nasal cannula oxygen  Post-op Assessment: Report given to RN and Post -op Vital signs reviewed and stable  Post vital signs: Reviewed and stable  Last Vitals:  Vitals Value Taken Time  BP 148/74 03/04/2018  3:34 PM  Temp    Pulse 89 03/04/2018  3:40 PM  Resp 22 03/04/2018  3:40 PM  SpO2 100 % 03/04/2018  3:40 PM  Vitals shown include unvalidated device data.  Last Pain:  Vitals:   03/04/18 1055  TempSrc:   PainSc: Asleep         Complications: No apparent anesthesia complications

## 2018-03-04 NOTE — Progress Notes (Signed)
Patient ID: [name], female   DOB: Jan 14, 1961, 58 y.o.   MRN: [id number]   Postop op exam at [facility]  Patient has had return of left leg sensation and Motor. Right  better but still with decreased sensation L3, L4 , L5 S1. Is able to hold foot up. DF and PF 3/5 .  !+ DTR  knee and Achilles. Perineal sensation intact.Feels foley. No Clonus or Babinski. UE 5/5.   Given 6mg  IV decadron and 4mg  q6 for 5 doses. Discussed with patient and family.   Continue to Monitor.

## 2018-03-04 NOTE — Anesthesia Procedure Notes (Signed)
Procedure Name: Intubation Date/Time: 03/04/2018 7:44 AM Performed by: Shirlyn Goltz, CRNA Pre-anesthesia Checklist: Patient identified, Emergency Drugs available, Suction available and Patient being monitored Patient Re-evaluated:Patient Re-evaluated prior to induction Oxygen Delivery Method: Circle system utilized Preoxygenation: Pre-oxygenation with 100% oxygen Induction Type: IV induction Ventilation: Mask ventilation without difficulty Laryngoscope Size: Mac and 3 Grade View: Grade I Tube type: Oral Tube size: 7.0 mm Number of attempts: 1 Airway Equipment and Method: Stylet Placement Confirmation: ETT inserted through vocal cords under direct vision,  positive ETCO2 and breath sounds checked- equal and bilateral Secured at: 21 cm Tube secured with: Tape Dental Injury: Teeth and Oropharynx as per pre-operative assessment

## 2018-03-04 NOTE — Progress Notes (Signed)
I tried to stand pt to get into WC. She is unable to bear her weight, legs buckled, "I can't feel my feet".. Pt. Returned to bed.  [name] is in the OR. Dr Jenita Seashore updated and she will go into OR & update [name].

## 2018-03-04 NOTE — Transfer of Care (Signed)
Immediate Anesthesia Transfer of Care Note  Patient: [name]  Procedure(s) Performed: Revision of Microlumbar Decompression Bilateral Lumbar Four-Five (Bilateral Spine Lumbar)  Patient Location: PACU  Anesthesia Type:General  Level of Consciousness: awake, drowsy and patient cooperative  Airway & Oxygen Therapy: Patient Spontanous Breathing and Patient connected to nasal cannula oxygen  Post-op Assessment: Report given to RN and Post -op Vital signs reviewed and stable  Post vital signs: Reviewed and stable  Last Vitals:  Vitals Value Taken Time  BP 132/76 03/04/2018 10:50 AM  Temp    Pulse 80 03/04/2018 10:51 AM  Resp 9 03/04/2018 10:51 AM  SpO2 100 % 03/04/2018 10:51 AM  Vitals shown include unvalidated device data.  Last Pain:  Vitals:   03/04/18 0552  TempSrc:   PainSc: 5          Complications: No apparent anesthesia complications

## 2018-03-04 NOTE — Interval H&P Note (Signed)
History and Physical Interval Note:  03/04/2018 7:20 AM  [name]  has presented today for surgery, with the diagnosis of Spinal stenosis L4-5  The various methods of treatment have been discussed with the patient and family. After consideration of risks, benefits and other options for treatment, the patient has consented to  Procedure(s) with comments: Microlumbar decompression L4-5 Bilateral (Bilateral) - 90 mins as a surgical intervention .  The patient's history has been reviewed, patient examined, no change in status, stable for surgery.  I have reviewed the patient's chart and labs.  Questions were answered to the patient's satisfaction.     Lavell Supple C

## 2018-03-04 NOTE — Discharge Instructions (Signed)
Walk As Tolerated utilizing back precautions.  No bending, twisting, or lifting.  No driving for 2 weeks.   °Aquacel dressing may remain in place until follow up. May shower with aquacel dressing in place. If the dressing peels off or becomes saturated, you may remove aquacel dressing and place gauze and tape dressing which should be kept clean and dry and changed daily. Do not remove steri-strips if they are present. °See Dr. Beane in office in 10 to 14 days. Begin taking aspirin 81mg per day starting 4 days after your surgery if not allergic to aspirin or on another blood thinner. °Walk daily even outside. Use a cane or walker only if necessary. °Avoid sitting on soft sofas. ° °

## 2018-03-04 NOTE — Anesthesia Postprocedure Evaluation (Signed)
Anesthesia Post Note  Patient: [name]  Procedure(s) Performed: EXPLORATION OF LUMBAR DECOMPRESSION WOUND (N/A Spine Lumbar)     Patient location during evaluation: PACU Anesthesia Type: General Level of consciousness: awake and alert, oriented and patient cooperative Pain management: pain level controlled Vital Signs Assessment: post-procedure vital signs reviewed and stable Respiratory status: spontaneous breathing, nonlabored ventilation and respiratory function stable Cardiovascular status: blood pressure returned to baseline and stable Postop Assessment: no apparent nausea or vomiting Anesthetic complications: no Comments: Pt unable to ambulate, bilat lower extrem sensory and motor deficits. [name] to re-evaluate.    Last Vitals:  Vitals:   03/04/18 1755 03/04/18 1800  BP: (!) 154/81   Pulse:  78  Resp:  18  Temp:    SpO2:  99%    Last Pain:  Vitals:   03/04/18 1800  TempSrc:   PainSc: 2                  Ambur Province,E. Dore Oquin

## 2018-03-04 NOTE — Brief Op Note (Signed)
03/04/2018  10:34 AM  PATIENT:  [name]  57 y.o. female  PRE-OPERATIVE DIAGNOSIS:  Spinal stenosis L4-5  POST-OPERATIVE DIAGNOSIS:  Spinal stenosis L4-5  PROCEDURE:  Procedure(s) with comments: Revision of Microlumbar Decompression Bilateral Lumbar Four-Five (Bilateral) - 90 mins  SURGEON:  Surgeon(s) and Role:    [name], MD - Primary  PHYSICIAN ASSISTANT:   ASSISTANTS: Bissell   ANESTHESIA:   general  EBL:  100 mL   BLOOD ADMINISTERED:none  DRAINS: none   LOCAL MEDICATIONS USED:  MARCAINE     SPECIMEN:  No Specimen  DISPOSITION OF SPECIMEN:  N/A  COUNTS:  YES  TOURNIQUET:  * No tourniquets in log *  DICTATION: .Other Dictation: Dictation Number [id number]  PLAN OF CARE: Admit for overnight observation  PATIENT DISPOSITION:  PACU - hemodynamically stable.   Delay start of Pharmacological VTE agent (>24hrs) due to surgical blood loss or risk of bleeding: yes

## 2018-03-04 NOTE — Anesthesia Postprocedure Evaluation (Signed)
Anesthesia Post Note  Patient: JAMMY STLOUIS  Procedure(s) Performed: Revision of Microlumbar Decompression Bilateral Lumbar Four-Five (Bilateral Spine Lumbar)     Patient location during evaluation: PACU Anesthesia Type: General Level of consciousness: awake and alert, oriented and patient cooperative Pain management: pain level not controlled Vital Signs Assessment: post-procedure vital signs reviewed and stable Respiratory status: spontaneous breathing, nonlabored ventilation, respiratory function stable and patient connected to nasal cannula oxygen Cardiovascular status: blood pressure returned to baseline and stable Postop Assessment: no apparent nausea or vomiting Anesthetic complications: no Comments: Pt emergently back to OR for exploration of surgical site    Last Vitals:  Vitals:   03/04/18 1755 03/04/18 1800  BP: (!) 154/81   Pulse:  78  Resp:  18  Temp:    SpO2:  99%    Last Pain:  Vitals:   03/04/18 1800  TempSrc:   PainSc: 2                  Ramaj Frangos,E. Rilan Eiland

## 2018-03-04 NOTE — Progress Notes (Signed)
Pt waking up more, crying, "my back hurts, I can't feel my legs". She is unable to bend her knees, dorsi & plantar flexion weaker than on admission to PACU, she cannot feel me pinching her toes". I called Dr Tonita Cong and fully updated him. He will be over to see pt. Will continue to monitor closely.

## 2018-03-05 ENCOUNTER — Other Ambulatory Visit: Payer: Self-pay

## 2018-03-05 ENCOUNTER — Encounter (HOSPITAL_COMMUNITY): Payer: Self-pay | Admitting: Specialist

## 2018-03-05 DIAGNOSIS — R269 Unspecified abnormalities of gait and mobility: Secondary | ICD-10-CM | POA: Diagnosis not present

## 2018-03-05 DIAGNOSIS — M48061 Spinal stenosis, lumbar region without neurogenic claudication: Secondary | ICD-10-CM | POA: Diagnosis not present

## 2018-03-05 LAB — BASIC METABOLIC PANEL
ANION GAP: 10 (ref 5–15)
BUN: 11 mg/dL (ref 6–20)
CHLORIDE: 103 mmol/L (ref 101–111)
CO2: 26 mmol/L (ref 22–32)
Calcium: 8.7 mg/dL — ABNORMAL LOW (ref 8.9–10.3)
Creatinine, Ser: 1.08 mg/dL — ABNORMAL HIGH (ref 0.44–1.00)
GFR calc Af Amer: >60 mL/min (ref >60)
GFR, EST NON AFRICAN AMERICAN: 56 mL/min — AB (ref >60)
GLUCOSE: 173 mg/dL — AB (ref 65–99)
POTASSIUM: 4.1 mmol/L (ref 3.5–5.1)
Sodium: 139 mmol/L (ref 135–145)

## 2018-03-05 MED ORDER — DEXAMETHASONE SODIUM PHOSPHATE 4 MG/ML IJ SOLN
4.0000 mg | Freq: Four times a day (QID) | INTRAMUSCULAR | Status: AC
  Administered 2018-03-05 – 2018-03-06 (×4): 4 mg via INTRAVENOUS
  Filled 2018-03-05 (×7): qty 1

## 2018-03-05 MED ORDER — DEXAMETHASONE SODIUM PHOSPHATE 10 MG/ML IJ SOLN
6.0000 mg | Freq: Once | INTRAMUSCULAR | Status: AC
  Administered 2018-03-05: 6 mg via INTRAVENOUS
  Filled 2018-03-05: qty 1

## 2018-03-05 MED FILL — Thrombin For Soln 20000 Unit: CUTANEOUS | Qty: 1 | Status: AC

## 2018-03-05 NOTE — Evaluation (Signed)
Physical Therapy Evaluation Patient Details Name: [name] MRN: [id number] DOB: 08/12/1961 Today's Date: 03/05/2018   History of Present Illness  Pt is a 57 y/o female s/p Revision of Microlumbar Decompression Bilateral Lumbar Four-Five (Bilateral) and Central decompression at L4-5 with removal of partial spinous process of 4 and 5.  Exploration of the wound, evacuation of hematoma. PMH including but not limited to HTN.    Clinical Impression  Pt presented supine in bed with HOB elevated, awake and willing to participate in therapy session. Pt's sister and son present throughout session. Prior to admission, pt reported that she was using a SPC to ambulate and was independent with ADLs. Pt continues to work full-time at [employer]. Pt currently requires min A for bed mobility and min A x2 for sit<>stand and stand-pivot transfers with RW. Pt very limited secondary to back pain and R LE weakness. Pt would continue to benefit from skilled physical therapy services at this time while admitted and after d/c to address the below listed limitations in order to improve overall safety and independence with functional mobility.     Follow Up Recommendations Home health PT;Supervision/Assistance - 24 hour    Equipment Recommendations  Rolling walker with 5" wheels;3in1 (PT)    Recommendations for Other Services       Precautions / Restrictions Precautions Precautions: Fall;Back Required Braces or Orthoses: ("no brace needed") Restrictions Weight Bearing Restrictions: No      Mobility  Bed Mobility Overal bed mobility: Needs Assistance Bed Mobility: Rolling;Sidelying to Sit Rolling: Min guard Sidelying to sit: Min assist       General bed mobility comments: cueing for log roll, use of bed rails, assistance with moving R LE off of bed and for trunk elevation  Transfers Overall transfer level: Needs assistance Equipment used: Rolling walker (2 wheeled) Transfers: Sit to/from  [facility] Sit to Stand: Min assist;+2 safety/equipment;+2 physical assistance Stand pivot transfers: Min assist;+2 safety/equipment;+2 physical assistance       General transfer comment: increased time and effort, cueing for technique, assist to power into standing from EOB and for pivotal movements to chair; pt required assistance with RW management and stability  Ambulation/Gait             General Gait Details: deferred as per MD's progress note on 5/10 - "to chair only"  Stairs            Wheelchair Mobility    Modified Rankin (Stroke Patients Only)       Balance Overall balance assessment: Needs assistance Sitting-balance support: Feet supported;Bilateral upper extremity supported Sitting balance-Leahy Scale: Poor     Standing balance support: Bilateral upper extremity supported Standing balance-Leahy Scale: Poor                               Pertinent Vitals/Pain Pain Assessment: 0-10 Pain Score: 9  Pain Location: low back Pain Descriptors / Indicators: Grimacing;Guarding;Sore Pain Intervention(s): Monitored during session;Repositioned    Home Living Family/patient expects to be discharged to:: Private residence Living Arrangements: Spouse/significant other;Children Available Help at Discharge: Family;Available 24 hours/day Type of Home: House Home Access: Stairs to enter Entrance Stairs-Rails: Psychiatric nurse of Steps: 4 Home Layout: One level;Able to live on main level with bedroom/bathroom Home Equipment: Shower seat;Walker - standard;Cane - single point      Prior Function Level of Independence: Independent with assistive device(s)         Comments:  ambulated with [facility]     Hand Dominance        Extremity/Trunk Assessment   Upper Extremity Assessment Upper Extremity Assessment: Defer to OT evaluation    Lower Extremity Assessment Lower Extremity Assessment: RLE deficits/detail RLE  Deficits / Details: Pt with impaired sensation to light touch throughout LE; pt reported that she can feel something but it does not feel "normal". Pt able to partially WB through R LE with transfers but heavily utilizing bilateral UEs on RW. Pt with great difficulty with knee flexion secondary to weakness and back pain, requiring physical assistance to flex knee for positioning in preparation for standing RLE Sensation: decreased light touch RLE Coordination: decreased fine motor;decreased gross motor    Cervical / Trunk Assessment Cervical / Trunk Assessment: Other exceptions Cervical / Trunk Exceptions: s/p lumbar procedure  Communication   Communication: No difficulties  Cognition Arousal/Alertness: Awake/alert Behavior During Therapy: WFL for tasks assessed/performed Overall Cognitive Status: Within Functional Limits for tasks assessed                                        General Comments      Exercises     Assessment/Plan    PT Assessment Patient needs continued PT services  PT Problem List Decreased strength;Decreased activity tolerance;Decreased balance;Decreased coordination;Decreased mobility;Decreased knowledge of use of DME;Decreased safety awareness;Decreased knowledge of precautions;Pain       PT Treatment Interventions DME instruction;Gait training;Stair training;Functional mobility training;Therapeutic activities;Therapeutic exercise;Balance training;Neuromuscular re-education;Patient/family education    PT Goals (Current goals can be found in the Care Plan section)  Acute Rehab PT Goals Patient Stated Goal: decrease pain PT Goal Formulation: With patient/family Time For Goal Achievement: 03/19/18 Potential to Achieve Goals: Good    Frequency Min 5X/week   Barriers to discharge        Co-evaluation               AM-PAC PT "6 Clicks" Daily Activity  Outcome Measure Difficulty turning over in bed (including adjusting bedclothes,  sheets and blankets)?: Unable Difficulty moving from lying on back to sitting on the side of the bed? : Unable Difficulty sitting down on and standing up from a chair with arms (e.g., wheelchair, bedside commode, etc,.)?: Unable Help needed moving to and from a bed to chair (including a wheelchair)?: A Little Help needed walking in hospital room?: A Lot Help needed climbing 3-5 steps with a railing? : Total 6 Click Score: 9    End of Session Equipment Utilized During Treatment: Gait belt Activity Tolerance: Patient limited by pain Patient left: in chair;with call bell/phone within reach Nurse Communication: Mobility status PT Visit Diagnosis: Other abnormalities of gait and mobility (R26.89);Pain Pain - part of body: (back)    Time: 2229-7989 PT Time Calculation (min) (ACUTE ONLY): 28 min   Charges:   PT Evaluation $PT Eval Moderate Complexity: 1 Mod PT Treatments $Therapeutic Activity: 8-22 mins   PT G Codes:        [place], PT, DPT [phone]   [place] 03/05/2018, 10:43 AM

## 2018-03-05 NOTE — Progress Notes (Signed)
Orthopedic Tech Progress Note Patient Details:  [name] 06/01/1961 [id number]  Patient ID: [name], female   DOB: 1960-12-22, 57 y.o.   MRN: [id number]   [name] 03/05/2018, 9:24 AMCalled [employer] for right MAFO brace.

## 2018-03-05 NOTE — Evaluation (Signed)
Occupational Therapy Evaluation Patient Details Name: [name] MRN: [id number] DOB: 1961/09/07 Today's Date: 03/05/2018    History of Present Illness Pt is a 57 y/o female s/p Revision of Microlumbar Decompression Bilateral Lumbar Four-Five (Bilateral) and Central decompression at L4-5 with removal of partial spinous process of 4 and 5.  Exploration of the wound, evacuation of hematoma. PMH including but not limited to HTN.   Clinical Impression   Patient is s/p revision of microlumbar in an decompression BIL L and in an and in4-5 and and a surgery resulting in functional limitations due to the deficits listed below (see OT problem list). Pt currently restricted to OOB to chair only at this time. Pt with R LE weakness and pending brace updated by BIO tech per patient. Pt requires max (A) for stand pivot transfer. Patient will benefit from skilled OT acutely to increase independence and safety with ADLS to allow discharge [place] ( DME in room 3n1 and RW).     Follow Up Recommendations  Home health OT    Equipment Recommendations  3 in 1 bedside commode;Other (comment)(rw)    Recommendations for Other Services       Precautions / Restrictions Precautions Precautions: Fall;Back Precaution Comments: Pending arrival of second MAFO brace R LE- pt with decr R LE sensation Required Braces or Orthoses: ("no brace needed") Restrictions Weight Bearing Restrictions: No      Mobility Bed Mobility Overal bed mobility: Needs Assistance Bed Mobility: Rolling;Sit to Supine Rolling: Min assist     Sit to supine: Mod assist   General bed mobility comments: pt requires cues for sequence and (A) to elevate bil LE onto bed surface . pt cues to keep proper log roll position  Transfers Overall transfer level: Needs assistance   Transfers: Sit to/from Stand;Stand Pivot Transfers Sit to Stand: Mod assist Stand pivot transfers: Mod assist       General transfer comment: pt with (A)  to scoot to edge of chair max (A) pt requires (A) To power up into standing. Pt once in standing with weight shift to help progress to EOB     Balance Overall balance assessment: Needs assistance Sitting-balance support: Feet supported;Bilateral upper extremity supported Sitting balance-Leahy Scale: Poor     Standing balance support: Bilateral upper extremity supported Standing balance-Leahy Scale: Poor                             ADL either performed or assessed with clinical judgement   ADL Overall ADL's : Needs assistance/impaired Eating/Feeding: NPO   Grooming: Independent   Upper Body Bathing: Supervision/ safety   Lower Body Bathing: Moderate assistance   Upper Body Dressing : Supervision/safety   Lower Body Dressing: Moderate assistance Lower Body Dressing Details (indicate cue type and reason): pt able to cross figure 4 L LE only at this time. Pt unable to lift R LE. pt has reacher at home and familiar with dressing with it due to spouse knee replacement.  Toilet Transfer: Moderate assistance Toilet Transfer Details (indicate cue type and reason): stand pivot to the L side due to R LE weakness. encouraged OOB for all voids to bedside. Pt reports x2 days without void of bowel. Pt drinking prune juice warm at end of session           General ADL Comments: pt currently with progress notes from MD for OOB to chair only. OT to progress patient as activity is updated. Pt  educated on back precautions and proper sequence for transfer/ bed mobility . pt with large wedge shape pillow and educated to avoid when in the chair due to need to keep straight 90 degree chair posture   Back handout provided and reviewed adls in detail. Pt educated on:  avoid sitting for long periods of time, correct bed positioning for sleeping, correct sequence for bed mobility, avoiding lifting more than 5 pounds and never wash directly over incision.    Vision Baseline Vision/History: Wears  glasses Wears Glasses: Reading only       Perception     Praxis      Pertinent Vitals/Pain Pain Assessment: Faces Faces Pain Scale: Hurts even more Pain Location: low back Pain Descriptors / Indicators: Grimacing;Guarding;Sore Pain Intervention(s): Monitored during session;Premedicated before session;Repositioned     Hand Dominance Right   Extremity/Trunk Assessment Upper Extremity Assessment Upper Extremity Assessment: Overall WFL for tasks assessed   Lower Extremity Assessment Lower Extremity Assessment: Defer to PT evaluation RLE Sensation: decreased light touch;decreased proprioception   Cervical / Trunk Assessment Cervical / Trunk Assessment: Other exceptions Cervical / Trunk Exceptions: s/p lumbar procedure   Communication Communication Communication: No difficulties   Cognition Arousal/Alertness: Awake/alert Behavior During Therapy: WFL for tasks assessed/performed Overall Cognitive Status: Within Functional Limits for tasks assessed                                     General Comments  hema vac in place    Exercises     Shoulder Instructions      Home Living Family/patient expects to be discharged to:: Private residence Living Arrangements: Spouse/significant other;Children Available Help at Discharge: Family;Available 24 hours/day Type of Home: House Home Access: Stairs to enter [employer] of Steps: 4 Entrance Stairs-Rails: Right;Left Home Layout: One level;Able to live on main level with bedroom/bathroom     Bathroom Shower/Tub: Occupational psychologist: Standard Bathroom Accessibility: Yes   Home Equipment: Clinical cytogeneticist - standard;Cane - single point   Additional Comments: spouse pending knee replacement so limited in ability to help with phsycial (A)      Prior Functioning/Environment Level of Independence: Independent with assistive device(s)        Comments: ambulated with [facility]        OT  Problem List: Decreased activity tolerance;Decreased knowledge of precautions;Decreased knowledge of use of DME or AE;Decreased range of motion;Decreased safety awareness;Decreased strength;Impaired balance (sitting and/or standing);Pain;Impaired sensation      OT Treatment/Interventions: Self-care/ADL training;Therapeutic activities;Therapeutic exercise;DME and/or AE instruction;Patient/family education;Balance training    OT Goals(Current goals can be found in the care plan section) Acute Rehab OT Goals Patient Stated Goal: decrease pain OT Goal Formulation: With patient(Comment  history is(F6)) Time For Goal Achievement: 03/19/18 Potential to Achieve Goals: Good  OT Frequency: Min 3X/week   Barriers to D/C:            Co-evaluation              AM-PAC PT "6 Clicks" Daily Activity     Outcome Measure Help from another person eating meals?: None Help from another person taking care of personal grooming?: None Help from another person toileting, which includes using toliet, bedpan, or urinal?: A Little Help from another person bathing (including washing, rinsing, drying)?: A Little Help from another person to put on and taking off regular upper body clothing?: None Help from another person to  put on and taking off regular lower body clothing?: A Lot 6 Click Score: 20   End of Session Equipment Utilized During Treatment: Gait belt Nurse Communication: Mobility status;Precautions  Activity Tolerance: Patient tolerated treatment well Patient left: in bed;with call bell/phone within reach;with family/visitor present  OT Visit Diagnosis: Unsteadiness on feet (R26.81)                Time: 8088-1103 OT Time Calculation (min): 28 min Charges:  OT General Charges $OT Visit: 1 Visit OT Evaluation $OT Eval Moderate Complexity: 1 Mod OT Treatments $Self Care/Home Management : 8-22 mins G-Codes:      Jeri Modena   OTR/L Pager: [phone] Office: [phone] .   Parke Poisson B 03/05/2018, 2:46 PM

## 2018-03-05 NOTE — Care Management Note (Signed)
Case Management Note  Patient Details  Name: [name] MRN: [id number] Date of Birth: 1961/09/26  Subjective/Objective:   Pt is a 57 y/o female s/p Revision of Microlumbar Decompression Bilateral Lumbar Four-Five (Bilateral) and Central decompression at L4-5 with removal of partial spinous process of 4 and 5.  Exploration of the wound, evacuation of hematoma.                 Action/Plan: Case manager spoke with patient and her family concerning discharge plan. Choice for [place] was offered,referral was called to [place], [place] Liaison. Patient has  tremendous family support system.     Expected Discharge Date:    pending              Expected Discharge Plan:  [place]  In-House Referral:  NA  Discharge planning Services  CM Consult  Post Acute Care Choice:  Durable Medical Equipment, Home Health Choice offered to:  Patient  DME Arranged:  3-N-1, [place] rolling DME Agency:  [place]:  PT [place]:  [place]  Status of Service:  Completed, signed off  If discussed at [place] of Stay Meetings, dates discussed:    Additional Comments:  Ninfa Meeker, RN 03/05/2018, 10:51 AM

## 2018-03-05 NOTE — Progress Notes (Addendum)
Subjective: 1 Day Post-Op Procedure(s) (LRB): EXPLORATION OF LUMBAR DECOMPRESSION WOUND (N/A) Patient reports pain as 3 on 0-10 scale.   Left leg normal Right leg improving but numbness still.  Objective: Vital signs in last 24 hours: Temp:  [97 F (36.1 C)-97.9 F (36.6 C)] 97.5 F (36.4 C) (05/10 0503) Pulse Rate:  [54-116] 77 (05/10 0503) Resp:  [8-19] 16 (05/10 0503) BP: (95-174)/(60-110) 114/65 (05/10 0503) SpO2:  [98 %-100 %] 100 % (05/10 0503) Weight:  [65 kg (143 lb 4.8 oz)] 65 kg (143 lb 4.8 oz) (05/09 2135)  Intake/Output from previous day: 05/09 0701 - 05/10 0700 In: 2570 [P.O.:250; I.V.:2300] Out: 2300 [Urine:2100; Blood:200] Intake/Output this shift: Total I/O In: 570 [P.O.:150; I.V.:400; Other:20] Out: -   No results for input(s): HGB in the last 72 hours. No results for input(s): WBC, RBC, HCT, PLT in the last 72 hours. Recent Labs    03/05/18 0423  NA 139  K 4.1  CL 103  CO2 26  BUN 11  CREATININE 1.08*  GLUCOSE 173*  CALCIUM 8.7*   No results for input(s): LABPT, INR in the last 72 hours.  ABD soft  Left leg 5/5 all groups with normal sensation. Right leg better Thigh sensation improved. Distal to knee decreased though does feel better since PACU and last night Good rectal tone. Good perineal sensation. PF and DF 3/5 right. Able to do SLR off bed. Hemovac draining small amount.    Assessment/Plan: 1 Day Post-Op Procedure(s) (LRB): EXPLORATION OF LUMBAR DECOMPRESSION WOUND (N/A) Advance diet Up with therapy to chair only. IV decadron 6mg  now then 4mg  q6h. Discussed with patient . From Initial postop exam continues to improve. Left leg resolved. Right is slowly improving. Continue to Monitor. Discussed case with Dr. Rolena Infante last night. Will leave hemovac in for now. Will order MAFO brace R leg. Foley out now, if unable to void in 6h then bladder scan and straight cath prn.   BEANE,JEFFREY C 03/05/2018, 7:00 AM

## 2018-03-05 NOTE — Progress Notes (Signed)
Subjective: 1 Day Post-Op Procedure(s) (LRB): EXPLORATION OF LUMBAR DECOMPRESSION WOUND (N/A) Patient reports pain as 4 on 0-10 scale.   Reported voiding after catheter out . Spoke with nurse today that  Patient needed to void by 5 pm and if not full void then bladder scan And call MD.  Husband emptied basin before recording though was not informed. Asked nurse to bladder scan now.  Objective: Vital signs in last 24 hours: Temp:  [97.5 F (36.4 C)-97.9 F (36.6 C)] 97.5 F (36.4 C) (05/10 0503) Pulse Rate:  [54-85] 77 (05/10 0503) Resp:  [9-18] 16 (05/10 0503) BP: (95-125)/(60-86) 114/65 (05/10 0503) SpO2:  [98 %-100 %] 100 % (05/10 0503) Weight:  [65 kg (143 lb 4.8 oz)] 65 kg (143 lb 4.8 oz) (05/09 2135)  Intake/Output from previous day: 05/09 0701 - 05/10 0700 In: 2570 [P.O.:250; I.V.:2300] Out: 2300 [Urine:2100; Blood:200] Intake/Output this shift: Total I/O In: 140 [P.O.:140] Out: 1200 [Urine:1200]  No results for input(s): HGB in the last 72 hours. No results for input(s): WBC, RBC, HCT, PLT in the last 72 hours. Recent Labs    03/05/18 0423  NA 139  K 4.1  CL 103  CO2 26  BUN 11  CREATININE 1.08*  GLUCOSE 173*  CALCIUM 8.7*   No results for input(s): LABPT, INR in the last 72 hours.  Left leg intact. Right leg reports slight continued improvement in Sensation though more in thigh. Can hold foot up against gravity. EHL 2-3/5. PF 3+/5. Quad 4+/5. No DVT. Hemovac drain found disconnected therefore D/C'd. Had 20-30cc . No recorded drainage since AM when last emptied. Rectal good tone perineal sensation intact. Patient able to stand and transfer and lower to bedside commode For void. Measured at 290. Waiting for post void scan. Prevoid question 350 or 500. Placed wedge under legs. Abdomen soft.     Assessment/Plan: 1 Day Post-Op Procedure(s) (LRB): EXPLORATION OF LUMBAR DECOMPRESSION WOUND (N/A) Up with therapy  Awaiting post void bladder scan    Does not feel distended. Continue to encourage void. Glucose elevated from steroids most likely Discussed with family. With continued improvement will continue  Serial exams. Do not feel imaging necessary at this point. As she has improved since surgery and continues to do so. The right leg was the leg giving out on her preop requiring a cane. Discussed patient with Dr. Rolena Infante and Dr. Gladstone Lighter  And they concur. Hemovac d/c'd as was disconnected according to patient in AM 20-30cc in  Larger shoe for brace. CBC in Am and BMET. BP and pulse good.       [name] C 03/05/2018, 6:02 PM   [phone]

## 2018-03-05 NOTE — Progress Notes (Signed)
Subjective: 1 Day Post-Op Procedure(s) (LRB): EXPLORATION OF LUMBAR DECOMPRESSION WOUND (N/A) Patient reports pain as 4 on 0-10 scale.    Objective: Vital signs in last 24 hours: Temp:  [97.5 F (36.4 C)-97.9 F (36.6 C)] 97.5 F (36.4 C) (05/10 0503) Pulse Rate:  [54-91] 77 (05/10 0503) Resp:  [8-19] 16 (05/10 0503) BP: (95-154)/(60-94) 114/65 (05/10 0503) SpO2:  [98 %-100 %] 100 % (05/10 0503) Weight:  [65 kg (143 lb 4.8 oz)] 65 kg (143 lb 4.8 oz) (05/09 2135)  Intake/Output from previous day: 05/09 0701 - 05/10 0700 In: 2570 [P.O.:250; I.V.:2300] Out: 2300 [Urine:2100; Blood:200] Intake/Output this shift: Total I/O In: 140 [P.O.:140] Out: 1200 [Urine:1200]  No results for input(s): HGB in the last 72 hours. No results for input(s): WBC, RBC, HCT, PLT in the last 72 hours. Recent Labs    03/05/18 0423  NA 139  K 4.1  CL 103  CO2 26  BUN 11  CREATININE 1.08*  GLUCOSE 173*  CALCIUM 8.7*   No results for input(s): LABPT, INR in the last 72 hours.  Left leg intact. Better since AM in thigh and proximal to the knee for sensation on right In seated position  Able to do straight leg on the right now. Has 3/5 DF, PF right . Able to hold foot up briefly. Sporatic effort. Rectal good tone and can feel. No DVT.         Assessment/Plan: 1 Day Post-Op Procedure(s) (LRB): EXPLORATION OF LUMBAR DECOMPRESSION WOUND (N/A) Advance diet Up with therapy  D/c foley  Must void by  4PM or 5PM discussed with nurse post void scan if low volume. Has MAFO brace. Called company to fit. Sensation in leg improved from AM in thigh Minimal improvement distal to knee. Continue Observation.Decadron. Patient reported falling three time prior to surgery due to weak right leg Using a cane.  Discussed with family. Glucose up A1C.     Tamani Durney C 03/05/2018, 1:12 PM

## 2018-03-06 ENCOUNTER — Ambulatory Visit (HOSPITAL_BASED_OUTPATIENT_CLINIC_OR_DEPARTMENT_OTHER): Payer: 59

## 2018-03-06 DIAGNOSIS — R42 Dizziness and giddiness: Secondary | ICD-10-CM | POA: Diagnosis not present

## 2018-03-06 DIAGNOSIS — M96840 Postprocedural hematoma of a musculoskeletal structure following a musculoskeletal system procedure: Secondary | ICD-10-CM | POA: Diagnosis not present

## 2018-03-06 DIAGNOSIS — R51 Headache: Secondary | ICD-10-CM | POA: Diagnosis not present

## 2018-03-06 DIAGNOSIS — R29898 Other symptoms and signs involving the musculoskeletal system: Secondary | ICD-10-CM | POA: Diagnosis not present

## 2018-03-06 DIAGNOSIS — R609 Edema, unspecified: Secondary | ICD-10-CM | POA: Diagnosis not present

## 2018-03-06 DIAGNOSIS — R296 Repeated falls: Secondary | ICD-10-CM | POA: Diagnosis not present

## 2018-03-06 DIAGNOSIS — F419 Anxiety disorder, unspecified: Secondary | ICD-10-CM | POA: Diagnosis not present

## 2018-03-06 DIAGNOSIS — I10 Essential (primary) hypertension: Secondary | ICD-10-CM | POA: Diagnosis not present

## 2018-03-06 DIAGNOSIS — M48061 Spinal stenosis, lumbar region without neurogenic claudication: Secondary | ICD-10-CM | POA: Diagnosis not present

## 2018-03-06 DIAGNOSIS — D62 Acute posthemorrhagic anemia: Secondary | ICD-10-CM | POA: Diagnosis not present

## 2018-03-06 DIAGNOSIS — K5900 Constipation, unspecified: Secondary | ICD-10-CM | POA: Diagnosis not present

## 2018-03-06 LAB — BASIC METABOLIC PANEL
Anion gap: 4 — ABNORMAL LOW (ref 5–15)
BUN: 11 mg/dL (ref 6–20)
CHLORIDE: 107 mmol/L (ref 101–111)
CO2: 31 mmol/L (ref 22–32)
CREATININE: 0.92 mg/dL (ref 0.44–1.00)
Calcium: 8.6 mg/dL — ABNORMAL LOW (ref 8.9–10.3)
GFR calc Af Amer: >60 mL/min (ref >60)
GLUCOSE: 118 mg/dL — AB (ref 65–99)
POTASSIUM: 4.1 mmol/L (ref 3.5–5.1)
SODIUM: 142 mmol/L (ref 135–145)

## 2018-03-06 LAB — CBC
HCT: 31 % — ABNORMAL LOW (ref 36.0–46.0)
Hemoglobin: 10 g/dL — ABNORMAL LOW (ref 12.0–15.0)
MCH: 29.3 pg (ref 26.0–34.0)
MCHC: 32.3 g/dL (ref 30.0–36.0)
MCV: 90.9 fL (ref 78.0–100.0)
PLATELETS: 214 10*3/uL (ref 150–400)
RBC: 3.41 MIL/uL — ABNORMAL LOW (ref 3.87–5.11)
RDW: 13.4 % (ref 11.5–15.5)
WBC: 16.8 10*3/uL — ABNORMAL HIGH (ref 4.0–10.5)

## 2018-03-06 LAB — HEMOGLOBIN A1C
HEMOGLOBIN A1C: 5.5 % (ref 4.8–5.6)
Mean Plasma Glucose: 111.15 mg/dL

## 2018-03-06 NOTE — Progress Notes (Signed)
PT Cancellation Note  Patient Details Name: [name] MRN: [id number] DOB: Sep 28, 1961   Cancelled Treatment:    Reason Eval/Treat Not Completed: Medical issues which prohibited therapy(pt awaiting doppler to rule out DVT)   Earl Losee B Salle Brandle 03/06/2018, 2:26 PM  Elwyn Reach, [place]

## 2018-03-06 NOTE — Care Management (Signed)
Pt has DME and understands will receive HH PT with [facility].

## 2018-03-06 NOTE — Progress Notes (Addendum)
Subjective: 2 Days Post-Op Procedure(s) (LRB): EXPLORATION OF LUMBAR DECOMPRESSION WOUND (N/A) Patient reports pain as 3 on 0-10 scale.   Voiding well. Reports more sensation below knee.Also above thigh. Some swelling in leg Spoke with Pharm this AM Vanco still on med list. Should be D/c'd per protocol after HV d/c'd Yesterday. Did not rec today  Objective: Vital signs in last 24 hours: Temp:  [97.8 F (36.6 C)-98.1 F (36.7 C)] 98.1 F (36.7 C) (05/11 0631) Pulse Rate:  [58-83] 66 (05/11 0631) BP: (125-136)/(63-75) 125/64 (05/11 0631) SpO2:  [95 %-100 %] 99 % (05/11 0631)  Intake/Output from previous day: 05/10 0701 - 05/11 0700 In: 980 [P.O.:380; IV Piggyback:600] Out: 2418 [Urine:2408; Drains:10] Intake/Output this shift: No intake/output data recorded.  Recent Labs    03/06/18 0326  HGB 10.0*   Recent Labs    03/06/18 0326  WBC 16.8*  RBC 3.41*  HCT 31.0*  PLT 214   Recent Labs    03/05/18 0423 03/06/18 0326  NA 139 142  K 4.1 4.1  CL 103 107  CO2 26 31  BUN 11 11  CREATININE 1.08* 0.92  GLUCOSE 173* 118*  CALCIUM 8.7* 8.6*   No results for input(s): LABPT, INR in the last 72 hours.  ABD soft  Mild swelling in foot.Warm 2+ DP 1+ PT. No calf pain. Increased sensation L4 and S1 now. Still with 2+-3/5 dorsiflexion right Reports slightly increased sensation L5. Left leg continue to do well with pain much better than preop. Dressing with minimal drainage.    Assessment/Plan: 2 Days Post-Op Procedure(s) (LRB): EXPLORATION OF LUMBAR DECOMPRESSION WOUND (N/A)  Continues to improve neurologically with left leg better than preop And right leg improving in all dermatomes with mainly L5 Which was the deficit preop with DF weakness. With continued improvement will continue to watch. Plan D/C tomorrow Up with therapy  TED now when OOB. MAFO May get doppler on right leg With sensation improving resume TED right leg.     [name]  C 03/06/2018, 7:56 AM

## 2018-03-06 NOTE — Progress Notes (Signed)
OT Cancellation Note  Patient Details Name: [name] MRN: [id number] DOB: Apr 17, 1961   Cancelled Treatment:    Reason Eval/Treat Not Completed: Medical issues which prohibited therapy. Pt awaiting ultrasound to rule out DVT. Will check back as able and appropriate.   Norman Herrlich, MS OTR/L  Pager: [phone] 03/06/2018, 1:39 PM

## 2018-03-06 NOTE — Progress Notes (Signed)
*  PRELIMINARY RESULTS* Vascular Ultrasound Right lower extremity venous duplex has been completed.  Preliminary findings: No evidence of deep vein thrombosis or baker's cysts in the right lower extremity.   Everrett Coombe 03/06/2018, 3:40 PM

## 2018-03-07 DIAGNOSIS — M48061 Spinal stenosis, lumbar region without neurogenic claudication: Secondary | ICD-10-CM | POA: Diagnosis not present

## 2018-03-07 DIAGNOSIS — R29898 Other symptoms and signs involving the musculoskeletal system: Secondary | ICD-10-CM

## 2018-03-07 MED ORDER — MAGNESIUM CITRATE PO SOLN
1.0000 | Freq: Once | ORAL | Status: AC
Start: 2018-03-07 — End: 2018-03-07
  Administered 2018-03-07: 1 via ORAL
  Filled 2018-03-07 (×2): qty 296

## 2018-03-07 MED ORDER — POLYETHYLENE GLYCOL 3350 17 G PO PACK
17.0000 g | PACK | Freq: Every day | ORAL | Status: DC
  Administered 2018-03-07 – 2018-03-09 (×3): 17 g via ORAL
  Filled 2018-03-07 (×3): qty 1

## 2018-03-07 MED ORDER — LORAZEPAM 1 MG PO TABS
1.0000 mg | ORAL_TABLET | ORAL | Status: AC
  Administered 2018-03-08: 2 mg via ORAL
  Filled 2018-03-07: qty 2

## 2018-03-07 MED ORDER — LORAZEPAM 2 MG/ML IJ SOLN
1.0000 mg | INTRAMUSCULAR | Status: AC
  Filled 2018-03-07: qty 1

## 2018-03-07 NOTE — Progress Notes (Addendum)
Subjective: 3 Days Post-Op Procedure(s) (LRB): EXPLORATION OF LUMBAR DECOMPRESSION WOUND (N/A) Patient reports pain as 3 on 0-10 scale.   Voiding well. No BM. Right leg continues to improve though more above knee than below. Doppler negative  Objective: Vital signs in last 24 hours: Temp:  [97.5 F (36.4 C)-98.6 F (37 C)] 97.5 F (36.4 C) (05/12 0556) Pulse Rate:  [61-79] 61 (05/12 0556) Resp:  [16-18] 18 (05/12 0556) BP: (134-148)/(74-84) 148/84 (05/12 0556) SpO2:  [98 %-100 %] 100 % (05/12 0556)  Intake/Output from previous day: 05/11 0701 - 05/12 0700 In: -  Out: 1025 [Urine:1025] Intake/Output this shift: No intake/output data recorded.  Recent Labs    03/06/18 0326  HGB 10.0*   Recent Labs    03/06/18 0326  WBC 16.8*  RBC 3.41*  HCT 31.0*  PLT 214   Recent Labs    03/05/18 0423 03/06/18 0326  NA 139 142  K 4.1 4.1  CL 103 107  CO2 26 31  BUN 11 11  CREATININE 1.08* 0.92  GLUCOSE 173* 118*  CALCIUM 8.7* 8.6*   No results for input(s): LABPT, INR in the last 72 hours. Left leg fine. Intact pulses distally Compartment soft  No sig swelling in leg Right leg quad 4+/5 able to SLR . DF 2-3/5. PF 4/5. Slight improvement in L5 S1 below the knee. And L4 Dressing with minimal drainage    Assessment/Plan: 3 Days Post-Op Procedure(s) (LRB): EXPLORATION OF LUMBAR DECOMPRESSION WOUND (N/A) Advance diet Up with therapy D/C IV fluids  Possible D/C  Needs BM  Mag citrate    Kruti Horacek C 03/07/2018, 7:18 AM

## 2018-03-07 NOTE — Progress Notes (Addendum)
Occupational Therapy Treatment Patient Details Name: [name] MRN: [id number] DOB: 10/22/1961 Today's Date: 03/07/2018    History of present illness Pt is Cheryl 57 y/o female s/p Revision of Microlumbar Decompression Bilateral Lumbar Four-Five (Bilateral) and Central decompression at L4-5 with removal of partial spinous process of 4 and 5.  Exploration of the wound, evacuation of hematoma. PMH including but not limited to HTN.   OT comments  Per pt and RN, MD requesting slow progression with therapies this date. Pt demonstrating progress toward OT goals this session. She was able to complete LB ADL with mod assist adhering to back precautions today. Pt now with MAFO in room and OT assisted pt to don with shoes this session. Educated pt concerning importance of skin checks to ensure maintenance of skin integrity due to diminished R lower leg and foot sensation. Noted increased R knee and heel cord tightness and facilitated improved PROM with prolonged stretch prior to initiating toilet transfer practice. Pt was able to complete simulated toilet transfers with mod assist and RW today. She did experience increase in pain with this. Continued to encourage OOB to [facility] for toileting tasks with pt verbalizing understanding. D/C recommendation remains appropriate. Pt's son present during session but asleep on couch. OT will continue to follow while admitted.    Follow Up Recommendations  Home health OT    Equipment Recommendations  3 in 1 bedside commode;Other (comment)(RW)    Recommendations for Other Services      Precautions / Restrictions Precautions Precautions: Fall;Back Precaution Comments: Verbally reviewed back precautions. Required cues to recall. MAFO brace has arrived. Per pt and RN MD is "considering" back brace although currently "no brace needed order." Required Braces or Orthoses: (R MAFO, "no brace needed") Restrictions Weight Bearing Restrictions: No       Mobility Bed  Mobility               General bed mobility comments: OOB in recliner on my arrival.   Transfers Overall transfer level: Needs assistance Equipment used: Rolling walker (2 wheeled) Transfers: Sit to/from [facility] Sit to Stand: Mod assist Stand pivot transfers: Mod assist       General transfer comment: Pt able to scoot to edge of chair without assistance. Mod assist to power up and maintain stability with RW.     Balance Overall balance assessment: Needs assistance Sitting-balance support: Feet supported;Bilateral upper extremity supported Sitting balance-Leahy Scale: Poor     Standing balance support: Bilateral upper extremity supported Standing balance-Leahy Scale: Poor Standing balance comment: Relies on BUE support.                            ADL either performed or assessed with clinical judgement   ADL Overall ADL's : Needs assistance/impaired Eating/Feeding: Set up;Sitting                   Lower Body Dressing: Moderate assistance Lower Body Dressing Details (indicate cue type and reason): Unable to lift RLE but able to cross L LE over knee for LB dressing.  Toilet Transfer: Moderate assistance Toilet Transfer Details (indicate cue type and reason): Stand-pivot to the L. Continued to encourage OOB for urination or BM           General ADL Comments: Per RN and pt MD stated to "go half speed" with therapy. In progress note "up with therapy" is only mention of instructions to therapy. Also reporting that  MD is "considering" back brace but pt still OK to chair for the time being. Session focused on LB dressing seated in recliner, applying MAFO, and simulated stnad-pivot toilet transfers. Educated pt concerning need to keep BLE in 90-90 positioning in chair to prevent pressure on her back.      Vision       Perception     Praxis      Cognition Arousal/Alertness: Awake/alert Behavior During Therapy: WFL for tasks  assessed/performed Overall Cognitive Status: Within Functional Limits for tasks assessed                                          Exercises Exercises: Other exercises Other Exercises Other Exercises: Noted tightness at R knee and heel cord and OT facilitated improved PROM with prolonged stretch x10 at each.    Shoulder Instructions       General Comments OT utilized MAFO in pt's new shoe. This remains very tight but able to safely apply. Educated pt concerning need to monitor R foot for skin integrity due to decreased sensation.     Pertinent Vitals/ Pain       Pain Assessment: 0-10 Pain Score: 7  Pain Location: low back Pain Descriptors / Indicators: Grimacing;Guarding;Sore Pain Intervention(s): Limited activity within patient's tolerance;Monitored during session;Repositioned  Home Living                                          Prior Functioning/Environment              Frequency  Min 3X/week        Progress Toward Goals  OT Goals(current goals can now be found in the care plan section)  Progress towards OT goals: Progressing toward goals  Acute Rehab OT Goals Patient Stated Goal: decrease pain OT Goal Formulation: With patient Time For Goal Achievement: 03/19/18 Potential to Achieve Goals: Good  Plan Discharge plan remains appropriate    Co-evaluation                 AM-PAC PT "6 Clicks" Daily Activity     Outcome Measure   Help from another person eating meals?: None Help from another person taking care of personal grooming?: None Help from another person toileting, which includes using toliet, bedpan, or urinal?: Cheryl Little Help from another person bathing (including washing, rinsing, drying)?: Cheryl Little Help from another person to put on and taking off regular upper body clothing?: None Help from another person to put on and taking off regular lower body clothing?: Cheryl Lot 6 Click Score: 20    End of Session  Equipment Utilized During Treatment: Gait belt  OT Visit Diagnosis: Unsteadiness on feet (R26.81)   Activity Tolerance Patient tolerated treatment well   Patient Left in bed;with call bell/phone within reach;with family/visitor present   Nurse Communication Mobility status;Precautions        Time: 1610-9604 OT Time Calculation (min): 20 min  Charges: OT General Charges $OT Visit: 1 Visit OT Treatments $Self Care/Home Management : 8-22 mins  Norman Herrlich, MS OTR/L  Pager: [place] [name] 03/07/2018, 8:37 AM

## 2018-03-07 NOTE — Consult Note (Addendum)
NEURO HOSPITALIST CONSULT NOTE   Requesting physician: [name]  Reason for Consult: Lower extremity weakness s/p lumbar surgery x 2  History obtained from:  Patient and Chart     HPI:                                                                                                                                          [name] is an 57 y.o. female presented for surgery after experiencing a 2 month history of bilateral lower extremity weakness, worse on the right. She suffered several falls due to this and had to start using a cane.   She was admitted for surgery on 4/30, with the diagnosis of spinal stenosis L4-5. Microlumbar revision/decompression at L4-5 bilaterally was performed by [name] with significant scar tissue noted. Then postoperatively had further weaknss. This was felt to be due to a postoperative spinal hematoma, and a second surgical procedure for exploration of the hematoma was planned. On exploration, not much hematoma was seen. A central and bilateral decompression with foraminotomies was performed during the procedure; the surgeon could not free up the thecal sac completely due to extensive scar tissue, with adhesions and tethering noted.   Since the second surgery, over the past 2 days her LLE has come back significantly from severe weakness. Right leg has improved somewhat, but has been slower to recover, mostly to thigh, with poor recovery to leg below knee; L5 and S1 left sensory loss noted by surgery, as well as "twitch" to ankle on attempted dorsi- and plantar flexion.   DDx per orthopedics regarding slow recovery from surgery includes traction effects from "crack back" due to hyperextension during surgery, as well as residual/inflamed scar tissue and tethering.   Neurology was called to further evaluate and give recommendations on additional imaging.   Past Medical History:  Diagnosis Date  . Anxiety   . Back pain    related to  spinal stenosis and disc problem, radiates down left buttocks to leg., weakness occ.  [name] Headache   . Hypertension   . PONV (postoperative nausea and vomiting)   . Vaginal foreign object    "Uses Femring"    Past Surgical History:  Procedure Laterality Date  . ABDOMINAL HYSTERECTOMY    . CARDIAC CATHETERIZATION N/A 04/18/2015   Procedure: Left Heart Cath and Coronary Angiography;  Surgeon: [name], MD;  Location: [place] [facility];  Service: Cardiovascular;  Laterality: N/A;  . FOOT SURGERY Bilateral    [place] "bunion,bone spur, tendon" (1) -6'16, (1)-10'16  . LUMBAR LAMINECTOMY/DECOMPRESSION MICRODISCECTOMY Bilateral 12/28/2015   Procedure: MICRO LUMBAR DECOMPRESSION L4 - L5 BILATERALLY;  Surgeon: [name], MD;  Location: [facility];  Service: Orthopedics;  Laterality: Bilateral;  . LUMBAR LAMINECTOMY/DECOMPRESSION MICRODISCECTOMY Bilateral 03/04/2018  Procedure: Revision of Microlumbar Decompression Bilateral Lumbar Four-Five;  Surgeon: [name], MD;  Location: [place];  Service: Orthopedics;  Laterality: Bilateral;  90 mins  . WISDOM TOOTH EXTRACTION    . WOUND EXPLORATION N/A 03/04/2018   Procedure: EXPLORATION OF LUMBAR DECOMPRESSION WOUND;  Surgeon: [name], MD;  Location: [place];  Service: Orthopedics;  Laterality: N/A;    Family History  Problem Relation Age of Onset  . Lung cancer Father   . Pancreatic cancer Sister   . Breast cancer Sister 32  . Throat cancer Brother   . Cancer Sister   . Multiple myeloma Sister   . Breast cancer Sister        diagnosed in her 56's  . Stomach cancer Cousin   . Colon cancer Neg Hx              Social History:  reports that she has never smoked. She has never used smokeless tobacco. She reports that she does not drink alcohol or use drugs.  Allergies  Allergen Reactions  . Cephalosporins Anaphylaxis  . Penicillins Anaphylaxis and Hives    Has patient had a PCN reaction causing immediate rash,  facial/tongue/throat swelling, SOB or lightheadedness with hypotension: Yes Has patient had a PCN reaction causing severe rash involving mucus membranes or skin necrosis: Yes Has patient had a PCN reaction that required hospitalization Yes Has patient had a PCN reaction occurring within the last 10 years: No If all of the above answers are "NO", then may proceed with Cephalosporin use.   . Anesthetics, Amide Nausea And Vomiting    Does not know name of it. States they put it on record foot center.   [name] Peach [Prunus Persica] Hives  . Latex Hives, Itching and Rash    MEDICATIONS:                                                                                                                     Scheduled: . acidophilus  1 capsule Oral Daily  . carvedilol  12.5 mg Oral BID  . docusate sodium  100 mg Oral BID  . estrogens (conjugated)  0.625 mg Oral Daily  . gabapentin  300 mg Oral TID  . magnesium citrate  1 Bottle Oral Once  . polyethylene glycol  17 g Oral Daily   Continuous: . methocarbamol (ROBAXIN)  IV      ROS:  Positive for postoperative low back pain. Other ROS as per HPI.    Blood pressure 137/75, pulse 77, temperature 98.4 F (36.9 C), temperature source Oral, resp. rate 18, height '5\' 5"'$  (1.651 m), weight 65 kg (143 lb 4.8 oz), SpO2 98 %.   General Examination:                                                                                                      Physical Exam  HEENT- [place]/AT    Lungs- Respirations unlabored Extremities- Warm and well perfused.   Neurological Examination Mental Status: Alert and intact to complex questions and commands. Cranial Nerves: II: Visually tracks normally. PERRL.  III,IV, VI: Mild esotropia. Horizontal EOM are full. No nystagmus. No ptosis. V,VII: Smile symmetric, facial temp sensation normal  bilaterally VIII: hearing intact to voice IX,X: Palate rises symmetrically XI: Symmetric shoulder shrug XII: midline tongue without lingual dysarthria Motor: Bilateral upper extremities 5/5 RLE: 3/5 hip flexion, hip extension, knee extension/ 2/5 knee flexion. 1/5 ankle plantar/dorsiflexion LLE: 5/5 all muscle groups.  Decreased ROM passively to ankles bilaterally, worse on the right Sensory: Upper extremities normal.  Right lower extremity:   Fine touch decreased circumferentially from mid-thigh to plantar aspect of foot  Temperature decreased from approximately 10 cm below knee to plantar aspect of foot  Proprioception absent to great toe and ankle, decreased to right knee Left lower extremity:  Fine touch normal  Temp decreased along plantar aspect of foot  Proprioception normal Deep Tendon Reflexes:  2+ right biceps and brachioradialis 1+ left biceps and brachioradialis 3+ right patella, 2+ left patella 0 achilles bilaterally Toes mute bilaterally Cerebellar: No ataxia noted.  Gait: Deferred due to weakness and falls risk concerns   Lab Results: Basic Metabolic Panel: Recent Labs  Lab 03/05/18 0423 03/06/18 0326  NA 139 142  K 4.1 4.1  CL 103 107  CO2 26 31  GLUCOSE 173* 118*  BUN 11 11  CREATININE 1.08* 0.92  CALCIUM 8.7* 8.6*    CBC: Recent Labs  Lab 03/06/18 0326  WBC 16.8*  HGB 10.0*  HCT 31.0*  MCV 90.9  PLT 214    Cardiac Enzymes: No results for input(s): CKTOTAL, CKMB, CKMBINDEX, TROPONINI in the last 168 hours.  Lipid Panel: No results for input(s): CHOL, TRIG, HDL, CHOLHDL, VLDL, LDLCALC in the last 168 hours.  Imaging: No results found.  Assessment: 57 year old female with right lower extremity weakness and sensory loss s/p lumbar surgery x 2 1. Exam with unusual localization of nondermatomal sensory impairment, as described above. The circumferential distributions would normally be expected of a peripheral neuropathy. Her motor  impairment is somewhat more straightforward; if attributing right hip weakness to guarding from postoperative pain, then right knee flexion weakness worse than knee extension in addition to severe weakness of plantar/dorsiflexion of right foot suggests individual nerve root impairment, L5/S1 worse than L3/L4.  2. An additional unusual finding is possible UMN involvement as right patella is hyperreflexic. Localization unclear as also with relative hyperreflexia of RUE (2's) relative to LUE (1's).  3.  Overall pattern of findings in conjunction with the history and operative findings described to me by [name], is most consistent with right-sided adhesion and some tethering at L4 and L5 resulting in nerve root traction and/or postoperative inflammation of scar tissue within the adhesion. "Crack back" from flexion/extension of back during the procedure also a possible contributing factor.    Recommendations: 1. MRI brain with/without contrast to rule out possible left ACA stroke, which possibly may have occurred 2 months ago at onset of her right lower extremity weakness.  2. MRI cervical spine with/without contrast to evaluate for possible disc pathology or right sided adhesion as a potential etiology for her relative RUE hyperreflexia.  3. MRI thoracic spine with/without contrast to evaluate for possible disc pathology 4. MRI lumbar spine with/without contrast to assess morphology and or edema/inflammation at the site of the L4/L5 right-sided adhesion.  5. Discussed with Dr. Trenton Gammon.  6. Also discussed with the patient and I have given her the opportunity to have all questions answered.   Electronically signed: Dr. Kerney Elbe 03/07/2018, 2:13 PM

## 2018-03-07 NOTE — Progress Notes (Signed)
Subjective: 3 Days Post-Op Procedure(s) (LRB): EXPLORATION OF LUMBAR DECOMPRESSION WOUND (N/A) Patient reports pain as 3 on 0-10 scale.   Discussed with Dr. Rolena Infante and Dr. Louanne Skye Objective: Vital signs in last 24 hours: Temp:  [97.5 F (36.4 C)-98.6 F (37 C)] 97.7 F (36.5 C) (05/12 1503) Pulse Rate:  [61-78] 73 (05/12 1503) Resp:  [16-18] 18 (05/12 1503) BP: (127-148)/(74-84) 127/75 (05/12 1503) SpO2:  [97 %-100 %] 97 % (05/12 1503)  Intake/Output from previous day: 05/11 0701 - 05/12 0700 In: -  Out: 1025 [Urine:1025] Intake/Output this shift: Total I/O In: 720 [P.O.:720] Out: -   Recent Labs    03/06/18 0326  HGB 10.0*   Recent Labs    03/06/18 0326  WBC 16.8*  RBC 3.41*  HCT 31.0*  PLT 214   Recent Labs    03/05/18 0423 03/06/18 0326  NA 139 142  K 4.1 4.1  CL 103 107  CO2 26 31  BUN 11 11  CREATININE 1.08* 0.92  GLUCOSE 173* 118*  CALCIUM 8.7* 8.6*   No results for input(s): LABPT, INR in the last 72 hours.  ABD soft  UE 5/5 2+ DTR no Hoffman. Left leg intact except slight decrease S1 Right sensation improved though not distal to mid calf. Tight achilles tendons bilat with passive to -10. 2-3/5 DF right No DVT   Assessment/Plan: 3 Days Post-Op Procedure(s) (LRB): EXPLORATION OF LUMBAR DECOMPRESSION WOUND (N/A) Up with therapy  Still with improvement but since right has not recovered like left I consulted Neurology to evaluate to R/O other involvement UMN etc.  Discussed with Neurology. Differential diagnosis discussed. Imaging just to make sure even though no UMN signs. Scar tissue and tethered cord and with slight reflex asymmetry in arm Cervical MRI as well. Felt all that could be decompressed on the right was though epidural fibrosis on right prevented full mobilization of thecal sac without high risk of A Dural rent.  Appreciate Neurology consult     Zaelyn Barbary C 03/07/2018, 3:25 PM   [phone]

## 2018-03-07 NOTE — Progress Notes (Signed)
Physical Therapy Treatment Patient Details Name: [name] MRN: [id number] DOB: [birth year] Today's Date: 03/07/2018    History of Present Illness Pt is a 57 y/o female s/p Revision of Microlumbar Decompression Bilateral Lumbar Four-Five (Bilateral) and Central decompression at L4-5 with removal of partial spinous process of 4 and 5.  Exploration of the wound, evacuation of hematoma. PMH including but not limited to HTN.    PT Comments    Patient is making progress toward PT goals and tolerated session well. Pt was able to ambulate 60ft X2 with min A and mod cues using RW. Pt continues to have decreased sensation R LE below knee and with difficulty advancing R LE forward. Continue to progress as tolerated.   Follow Up Recommendations  Home health PT;Supervision/Assistance - 24 hour     Equipment Recommendations  Rolling walker with 5" wheels;3in1 (PT)    Recommendations for Other Services       Precautions / Restrictions Precautions Precautions: Fall;Back Required Braces or Orthoses: (R MAFO, "no brace needed") Restrictions Weight Bearing Restrictions: No    Mobility  Bed Mobility Overal bed mobility: Needs Assistance Bed Mobility: Rolling;Sidelying to Sit Rolling: Supervision Sidelying to sit: Min guard       General bed mobility comments: use of bed rail; good demo of log roll technique  Transfers Overall transfer level: Needs assistance Equipment used: Rolling walker (2 wheeled) Transfers: Sit to/from Stand Sit to Stand: Min assist;From elevated surface;Min guard         General transfer comment: min A initial stand from EOB and min guard from recliner; cues for hand placement  Ambulation/Gait Ambulation/Gait assistance: Min assist Ambulation Distance (Feet): (27ft X2) Assistive device: Rolling walker (2 wheeled) Gait Pattern/deviations: Step-to pattern;Decreased step length - right;Decreased dorsiflexion - right;Trunk flexed Gait velocity:  decreased   General Gait Details: mulitmodal cues for posture, sequencing, and weight shifting; with practice pt able to clear rigth foot from floor and increase R hip flexion for improved step length    Stairs             Wheelchair Mobility    Modified Rankin (Stroke Patients Only)       Balance Overall balance assessment: Needs assistance Sitting-balance support: Feet supported;Bilateral upper extremity supported Sitting balance-Leahy Scale: Fair     Standing balance support: Bilateral upper extremity supported Standing balance-Leahy Scale: Poor Standing balance comment: Relies on BUE support.                             Cognition Arousal/Alertness: Awake/alert Behavior During Therapy: WFL for tasks assessed/performed Overall Cognitive Status: Within Functional Limits for tasks assessed                                        Exercises      General Comments        Pertinent Vitals/Pain Pain Assessment: Faces Faces Pain Scale: Hurts little more Pain Location: R side low back  Pain Descriptors / Indicators: Guarding;Sore Pain Intervention(s): Limited activity within patient's tolerance;Monitored during session;Premedicated before session;Repositioned    Home Living                      Prior Function            PT Goals (current goals can now be found in the care plan section)  Frequency    Min 5X/week      PT Plan Current plan remains appropriate    Co-evaluation              AM-PAC PT "6 Clicks" Daily Activity  Outcome Measure  Difficulty turning over in bed (including adjusting bedclothes, sheets and blankets)?: Unable Difficulty moving from lying on back to sitting on the side of the bed? : Unable Difficulty sitting down on and standing up from a chair with arms (e.g., wheelchair, bedside commode, etc,.)?: Unable Help needed moving to and from a bed to chair (including a wheelchair)?: A  Little Help needed walking in hospital room?: A Lot Help needed climbing 3-5 steps with a railing? : A Lot 6 Click Score: 10    End of Session Equipment Utilized During Treatment: Gait belt Activity Tolerance: Patient tolerated treatment well Patient left: in chair;with call bell/phone within reach;with family/visitor present Nurse Communication: Mobility status PT Visit Diagnosis: Other abnormalities of gait and mobility (R26.89);Pain Pain - part of body: (back)     Time: 1779-3903 PT Time Calculation (min) (ACUTE ONLY): 24 min  Charges:  $Gait Training: 23-37 mins                    G Codes:       Earney Navy, PTA Pager: [phone]     Darliss Cheney 03/07/2018, 4:04 PM

## 2018-03-08 ENCOUNTER — Ambulatory Visit (HOSPITAL_COMMUNITY): Payer: 59

## 2018-03-08 DIAGNOSIS — F419 Anxiety disorder, unspecified: Secondary | ICD-10-CM | POA: Diagnosis present

## 2018-03-08 DIAGNOSIS — D62 Acute posthemorrhagic anemia: Secondary | ICD-10-CM | POA: Diagnosis not present

## 2018-03-08 DIAGNOSIS — Z4789 Encounter for other orthopedic aftercare: Secondary | ICD-10-CM | POA: Diagnosis not present

## 2018-03-08 DIAGNOSIS — M48061 Spinal stenosis, lumbar region without neurogenic claudication: Secondary | ICD-10-CM | POA: Diagnosis not present

## 2018-03-08 DIAGNOSIS — R531 Weakness: Secondary | ICD-10-CM

## 2018-03-08 DIAGNOSIS — Z87828 Personal history of other (healed) physical injury and trauma: Secondary | ICD-10-CM | POA: Diagnosis not present

## 2018-03-08 DIAGNOSIS — Z800 Family history of malignant neoplasm of digestive organs: Secondary | ICD-10-CM | POA: Diagnosis not present

## 2018-03-08 DIAGNOSIS — Y838 Other surgical procedures as the cause of abnormal reaction of the patient, or of later complication, without mention of misadventure at the time of the procedure: Secondary | ICD-10-CM | POA: Diagnosis not present

## 2018-03-08 DIAGNOSIS — I10 Essential (primary) hypertension: Secondary | ICD-10-CM | POA: Diagnosis not present

## 2018-03-08 DIAGNOSIS — K5900 Constipation, unspecified: Secondary | ICD-10-CM | POA: Diagnosis not present

## 2018-03-08 DIAGNOSIS — R262 Difficulty in walking, not elsewhere classified: Secondary | ICD-10-CM | POA: Diagnosis not present

## 2018-03-08 DIAGNOSIS — R296 Repeated falls: Secondary | ICD-10-CM | POA: Diagnosis present

## 2018-03-08 DIAGNOSIS — R51 Headache: Secondary | ICD-10-CM

## 2018-03-08 DIAGNOSIS — G8918 Other acute postprocedural pain: Secondary | ICD-10-CM | POA: Diagnosis not present

## 2018-03-08 DIAGNOSIS — N179 Acute kidney failure, unspecified: Secondary | ICD-10-CM | POA: Diagnosis not present

## 2018-03-08 DIAGNOSIS — F411 Generalized anxiety disorder: Secondary | ICD-10-CM | POA: Diagnosis not present

## 2018-03-08 DIAGNOSIS — F418 Other specified anxiety disorders: Secondary | ICD-10-CM | POA: Diagnosis not present

## 2018-03-08 DIAGNOSIS — Z79899 Other long term (current) drug therapy: Secondary | ICD-10-CM | POA: Diagnosis not present

## 2018-03-08 DIAGNOSIS — M4804 Spinal stenosis, thoracic region: Secondary | ICD-10-CM | POA: Diagnosis not present

## 2018-03-08 DIAGNOSIS — R42 Dizziness and giddiness: Secondary | ICD-10-CM | POA: Diagnosis not present

## 2018-03-08 DIAGNOSIS — R519 Headache, unspecified: Secondary | ICD-10-CM

## 2018-03-08 DIAGNOSIS — D72829 Elevated white blood cell count, unspecified: Secondary | ICD-10-CM | POA: Diagnosis not present

## 2018-03-08 DIAGNOSIS — G8220 Paraplegia, unspecified: Secondary | ICD-10-CM | POA: Diagnosis not present

## 2018-03-08 DIAGNOSIS — Z91018 Allergy to other foods: Secondary | ICD-10-CM | POA: Diagnosis not present

## 2018-03-08 DIAGNOSIS — M7989 Other specified soft tissue disorders: Secondary | ICD-10-CM | POA: Diagnosis not present

## 2018-03-08 DIAGNOSIS — M4802 Spinal stenosis, cervical region: Secondary | ICD-10-CM | POA: Diagnosis not present

## 2018-03-08 DIAGNOSIS — Z801 Family history of malignant neoplasm of trachea, bronchus and lung: Secondary | ICD-10-CM | POA: Diagnosis not present

## 2018-03-08 DIAGNOSIS — Z888 Allergy status to other drugs, medicaments and biological substances status: Secondary | ICD-10-CM | POA: Diagnosis not present

## 2018-03-08 DIAGNOSIS — M96840 Postprocedural hematoma of a musculoskeletal structure following a musculoskeletal system procedure: Secondary | ICD-10-CM | POA: Diagnosis not present

## 2018-03-08 DIAGNOSIS — Z90710 Acquired absence of both cervix and uterus: Secondary | ICD-10-CM | POA: Diagnosis not present

## 2018-03-08 DIAGNOSIS — M5416 Radiculopathy, lumbar region: Secondary | ICD-10-CM | POA: Diagnosis not present

## 2018-03-08 DIAGNOSIS — G8929 Other chronic pain: Secondary | ICD-10-CM

## 2018-03-08 DIAGNOSIS — Z91040 Latex allergy status: Secondary | ICD-10-CM | POA: Diagnosis not present

## 2018-03-08 DIAGNOSIS — Z880 Allergy status to penicillin: Secondary | ICD-10-CM | POA: Diagnosis not present

## 2018-03-08 MED ORDER — FLEET ENEMA 7-19 GM/118ML RE ENEM
1.0000 | ENEMA | Freq: Every day | RECTAL | Status: DC | PRN
  Filled 2018-03-08: qty 1

## 2018-03-08 MED ORDER — GADOBENATE DIMEGLUMINE 529 MG/ML IV SOLN
15.0000 mL | Freq: Once | INTRAVENOUS | Status: AC
  Administered 2018-03-08: 13 mL via INTRAVENOUS

## 2018-03-08 NOTE — Consult Note (Signed)
Physical Medicine and Rehabilitation Consult   Reason for Consult: Functional deficits due to L4/5 stenosis and complicated post op course.   Referring Physician: [name].    HPI: [name] is a 57 y.o. female with history of HTN, HA, lumbar microdiskectomy 02/2016, recent falls with back injury. history taken from chart review and patient. 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  She was admitted on 03/04/2018 for foraminotomies L4/5 with lysis of epidural fibrosis and revision of bilateral hemilaminotomies L4/5 by [name].  While in PACU, patient reported loss of sensation from waist down with inability to move BLE. She was taken back to OR  for exploration of wound with evacuation of hematoma and removal of partial spinous process of L4 and L5. Sensory deficits slowly improving LLE. RLE and Dr. Horatio Pel consulted for input questioned symptoms due to nerve root traction and/or post op inflammation form adhesions or "Crack back" from flexion/hyperextension during surgery. MRI brain reviewed, unremarkable for acute intracranial process. MRI cervical, thoracic, lumbar spine ordered for work up showing multilevel spondylosis with mild-moderate stenosis. She states she required assistance with some ADLs including bathing and cooking prior to admission.  Therapy ongoing and MAFO ordered to help with weak DF on right. MD recommending CIR due to functional decline.    Review of Systems  Constitutional: Positive for malaise/fatigue.  Musculoskeletal: Positive for back pain, joint pain and myalgias.  Neurological: Positive for sensory change, focal weakness and weakness.  All other systems reviewed and are negative.     Past Medical History:  Diagnosis Date  . Anxiety   . Back pain    related to spinal stenosis and disc problem, radiates down left buttocks to leg., weakness occ.    [name] Headache   . Hypertension   . PONV (postoperative nausea and vomiting)   . Vaginal foreign object    "Uses Femring"    Past Surgical History:  Procedure Laterality Date  . ABDOMINAL HYSTERECTOMY    . CARDIAC CATHETERIZATION N/A 04/18/2015   Procedure: Left Heart Cath and Coronary Angiography;  Surgeon: [name], MD;  Location: [facility];  Service: Cardiovascular;  Laterality: N/A;  . FOOT SURGERY Bilateral    Olds "bunion,bone spur, tendon" (1) -6'16, (1)-10'16  . LUMBAR LAMINECTOMY/DECOMPRESSION MICRODISCECTOMY Bilateral 12/28/2015   Procedure: MICRO LUMBAR DECOMPRESSION L4 - L5 BILATERALLY;  Surgeon: [name], MD;  Location: [facility];  Service: Orthopedics;  Laterality: Bilateral;  . LUMBAR LAMINECTOMY/DECOMPRESSION MICRODISCECTOMY Bilateral 03/04/2018   Procedure: Revision of Microlumbar Decompression Bilateral Lumbar Four-Five;  Surgeon: [name], MD;  Location: [facility];  Service: Orthopedics;  Laterality: Bilateral;  90 mins  . WISDOM TOOTH EXTRACTION    . WOUND EXPLORATION N/A 03/04/2018   Procedure: EXPLORATION OF LUMBAR DECOMPRESSION WOUND;  Surgeon: [name], MD;  Location: [facility];  Service: Orthopedics;  Laterality: N/A;    Family History  Problem Relation Age of Onset  . Lung cancer Father   . Pancreatic cancer Sister   . Breast cancer Sister 35  . Throat cancer Brother   . Cancer Sister   . Multiple myeloma Sister   . Breast cancer Sister        diagnosed in her 47's  . Stomach cancer Cousin   . Colon cancer Neg Hx    Social History:  reports that she has never smoked.  She has never used smokeless tobacco. She reports that she does not drink alcohol or use drugs.   Allergies  Allergen Reactions  . Cephalosporins Anaphylaxis  . Penicillins Anaphylaxis and Hives    Has patient had a PCN reaction causing immediate rash, facial/tongue/throat swelling, SOB or lightheadedness with hypotension: Yes Has patient had a PCN reaction  causing severe rash involving mucus membranes or skin necrosis: Yes Has patient had a PCN reaction that required hospitalization Yes Has patient had a PCN reaction occurring within the last 10 years: No If all of the above answers are "NO", then may proceed with Cephalosporin use.   . Anesthetics, Amide Nausea And Vomiting    Does not know name of it. States they put it on record foot center.   [name] Peach [Prunus Persica] Hives  . Latex Hives, Itching and Rash   Medications Prior to Admission  Medication Sig Dispense Refill  . carvedilol (COREG) 12.5 MG tablet Take 12.5 mg by mouth 2 (two) times daily.  5  . LORazepam (ATIVAN) 0.5 MG tablet Take 1 tablet (0.5 mg total) by mouth every 8 (eight) hours. (Patient taking differently: Take 0.5 mg by mouth every 8 (eight) hours as needed for anxiety. ) 15 tablet 0  . PREMARIN 0.625 MG tablet Take 0.625 mg by mouth daily.  7  . [DISCONTINUED] methocarbamol (ROBAXIN) 500 MG tablet Take 500 mg by mouth every 6 (six) hours as needed. For back pain (scheduled in the evening)  1  . [DISCONTINUED] oxyCODONE-acetaminophen (PERCOCET/ROXICET) 5-325 MG tablet Take 1 tablet by mouth every 6 (six) hours as needed for pain.  0  . diphenhydrAMINE (BENADRYL) 25 MG tablet Take 1 tablet (25 mg total) by mouth 3 (three) times daily. Take one tablet three times daily for two days (Patient not taking: Reported on 02/18/2018) 10 tablet 0  . doxycycline (VIBRAMYCIN) 100 MG capsule Take 100 mg by mouth 2 (two) times daily.  2  . EPINEPHrine 0.15 MG/0.15ML IJ injection Inject 0.15 mLs (0.15 mg total) into the muscle as needed for anaphylaxis. (Patient not taking: Reported on 02/18/2018) 2 Device 0  . EPINEPHrine 0.3 mg/0.3 mL IJ SOAJ injection Inject 0.3 mg into the muscle daily as needed for anaphylaxis.  0  . famotidine (PEPCID) 20 MG tablet Take 1 tablet (20 mg total) by mouth 2 (two) times daily. Take one tablet twice daily for two days (Patient not taking: Reported on  02/18/2018) 10 tablet 0  . predniSONE (DELTASONE) 20 MG tablet Take 2 tablets (40 mg total) by mouth daily with breakfast. For the next four days (Patient not taking: Reported on 02/18/2018) 8 tablet 0    Home: Home Living Family/patient expects to be discharged to:: Private residence Living Arrangements: Spouse/significant other, Children Available Help at Discharge: Family, Available 24 hours/day Type of Home: House Home Access: Stairs to enter [employer] of Steps: 4 Entrance Stairs-Rails: Right, Left Home Layout: One level, Able to live on main level with bedroom/bathroom Bathroom Shower/Tub: Multimedia programmer: Standard Bathroom Accessibility: Yes Home Equipment: Civil engineer, contracting, [place] - single point Additional Comments: spouse pending knee replacement so limited in ability to help with phsycial (A)  Functional History: Prior Function Level of Independence: Independent with assistive device(s) Comments: ambulated with [facility] Functional Status:  Mobility: Bed Mobility Overal bed mobility: Needs Assistance Bed Mobility: Rolling, Sidelying to Sit Rolling: Supervision Sidelying to sit: Min guard Sit to supine: Mod assist General bed mobility comments: use of bed rail;  good demo of log roll technique Transfers Overall transfer level: Needs assistance Equipment used: Rolling walker (2 wheeled) Transfers: Sit to/from Stand Sit to Stand: Min assist, From elevated surface, Min guard Stand pivot transfers: Mod assist General transfer comment: min A initial stand from EOB and min guard from recliner; cues for hand placement Ambulation/Gait Ambulation/Gait assistance: Min assist Ambulation Distance (Feet): (83f X2) Assistive device: Rolling walker (2 wheeled) Gait Pattern/deviations: Step-to pattern, Decreased step length - right, Decreased dorsiflexion - right, Trunk flexed General Gait Details: mulitmodal cues for posture, sequencing, and weight  shifting; with practice pt able to clear rigth foot from floor and increase R hip flexion for improved step length  Gait velocity: decreased    ADL: ADL Overall ADL's : Needs assistance/impaired Eating/Feeding: Set up, Sitting Grooming: Independent Upper Body Bathing: Supervision/ safety Lower Body Bathing: Moderate assistance Upper Body Dressing : Supervision/safety Lower Body Dressing: Moderate assistance Lower Body Dressing Details (indicate cue type and reason): Unable to lift RLE but able to cross L LE over knee for LB dressing.  Toilet Transfer: Moderate assistance Toilet Transfer Details (indicate cue type and reason): Stand-pivot to the L. Continued to encourage OOB for urination or BM General ADL Comments: Per RN and pt MD stated to "go half speed" with therapy. In progress note "up with therapy" is only mention of instructions to therapy. Also reporting that MD is "considering" back brace but pt still OK to chair for the time being. Session focused on LB dressing seated in recliner, applying MAFO, and simulated stnad-pivot toilet transfers. Educated pt concerning need to keep BLE in 90-90 positioning in chair to prevent pressure on her back.   Cognition: Cognition Overall Cognitive Status: Within Functional Limits for tasks assessed Orientation Level: Oriented X4 Cognition Arousal/Alertness: Awake/alert Behavior During Therapy: WFL for tasks assessed/performed Overall Cognitive Status: Within Functional Limits for tasks assessed  Blood pressure 118/61, pulse 82, temperature 97.9 F (36.6 C), temperature source Oral, resp. rate 18, height _0  (1.651 m), weight 65 kg (143 lb 4.8 oz), SpO2 98 %. Physical Exam  Vitals reviewed. Constitutional: She appears well-developed and well-nourished.  HENT:  Head: Normocephalic and atraumatic.  Eyes: EOM are normal. Right eye exhibits no discharge. Left eye exhibits no discharge.  Neck: Normal range of motion. Neck supple.    Cardiovascular: Normal rate and regular rhythm.  Respiratory: Effort normal and breath sounds normal.  GI: Soft. Bowel sounds are normal.  Musculoskeletal:  No edema or tenderness in extremities  Neurological: She is alert.  Motor: R upper extremities: 3+-4 -/5 proximal to distal Left lower extremity: Hip flexion, knee extension 3 -/5, ankle dorsiflexion 1/5 Right lower extremity: Hip flexion, knee extension 2/5, ankle dorsiflexion 1/5 Sensation absent to light touch in bilateral feet  Skin: Skin is warm and dry.  Psychiatric: She has a normal mood and affect. Her behavior is normal.    No results found for this or any previous visit (from the past 24 hour(s)). No results found.   Assessment/Plan: Diagnosis: Lumbar stenosis s/p decompression Labs and images independently reviewed.  Records reviewed and summated above.  1. Does the need for close, 24 hr/day medical supervision in concert with the patient's rehab needs make it unreasonable for this patient to be served in a less intensive setting? Yes  2. Co-Morbidities requiring supervision/potential complications: HTN (monitor and provide prns in accordance with increased physical exertion and pain), HA, leukocytosis (cont to monitor for signs and symptoms of infection, further workup if indicated), ABLA (  transfuse if necessary to ensure appropriate perfusion for increased activity tolerance), post-op pain (Biofeedback training with therapies to help reduce reliance on opiate pain medications, monitor pain control during therapies, and sedation at rest and titrate to maximum efficacy to ensure participation and gains in therapies) 3. Due to safety, skin/wound care, disease management, pain management and patient education, does the patient require 24 hr/day rehab nursing? Yes 4. Does the patient require coordinated care of a physician, rehab nurse, PT (1-2 hrs/day, 5 days/week) and OT (1-2 hrs/day, 5 days/week) to address physical and  functional deficits in the context of the above medical diagnosis(es)? Yes Addressing deficits in the following areas: balance, endurance, locomotion, strength, transferring, bathing, dressing, toileting and psychosocial support 5. Can the patient actively participate in an intensive therapy program of at least 3 hrs of therapy per day at least 5 days per week? Potentially 6. The potential for patient to make measurable gains while on inpatient rehab is good 7. Anticipated functional outcomes upon discharge from inpatient rehab are supervision  with PT, supervision with OT, n/a with SLP. 8. Estimated rehab length of stay to reach the above functional goals is: 12-17 days. 9. Anticipated D/C setting: Home 10. Anticipated post D/C treatments: HH therapy and Home excercise program 11. Overall Rehab/Functional Prognosis: good  RECOMMENDATIONS: This patient's condition is appropriate for continued rehabilitative care in the following setting: Patient reports increasing weakness and sensory deficits. if this indeed is the case, recommend CIR after completion of medical workup. Patient has agreed to participate in recommended program. Yes Note that insurance prior authorization may be required for reimbursement for recommended care.  Comment: Rehab Admissions Coordinator to follow up.   I have personally performed a face to face diagnostic evaluation, including, but not limited to relevant history and physical exam findings, of this patient and developed relevant assessment and plan.  Additionally, I have reviewed and concur with the physician assistant's documentation above.   Delice Lesch, MD, ABPMR Bary Leriche, PA-C 03/08/2018

## 2018-03-08 NOTE — Progress Notes (Signed)
Inpatient Rehabilitation  Met with patient at bedside to briefly discuss team's recommendation for IP Rehab.  She stated that she was not feeling well, but agreed to talk with [place] briefly.  Shared booklets and answered initial questions.  Plan to initiate insurance authorization.  Will follow up tomorrow in hopes of medical readiness, insurance approval, and IP Rehab bed availability.  Call if questions.    Carmelia Roller., CCC/SLP Admission Coordinator  [place]  Cell [phone]

## 2018-03-08 NOTE — Progress Notes (Signed)
PT is upset that she still cannot have bowel movement, RN gave patient Mag citrate as well as miralax and prune juice still no success. RN called [name] to notify him awiting call back

## 2018-03-08 NOTE — Progress Notes (Signed)
Thank you for consult on [name] Plan. Patient out of room for testing at this time. Chart reviewed and note that therapy recommends HHPT and HHOT for follow up therapy. Question prior level of activity as well as patient's preference for follow up therapy. Will follow up later to complete CIR consult.

## 2018-03-08 NOTE — Progress Notes (Signed)
Pt was complaining about not being able to have a bowel movement since last Wednesday, RN called MD earlier in the morning and got order for an Enema. After PT pt was complaining of dizziness and RN and PT took BP and it was elevated, we let the patient relax to bring her BP back down. About 30 minutes later we administered the Enema, and a little stool began to come out. During process pt stated that she was going to pass out she was very weak began closing her eyes and RN and another nurse helped put patient back in bed and took VS which are in the computer stated that she felt better already. Pt was AOx4 thought the whole incident. RN called and notified MD about what transpired he  Said to keep monitoring her. Still waiting to hear back from CIR about placement.

## 2018-03-08 NOTE — Progress Notes (Addendum)
Subjective: Some improvement of the sensation and strength.  Exam: Vitals:   03/08/18 0100 03/08/18 0406  BP: 138/74 118/61  Pulse: 68 82  Resp:    Temp: 97.8 F (36.6 C) 97.9 F (36.6 C)  SpO2: 98% 98%    Physical Exam   HEENT-  Normocephalic, no lesions, without obvious abnormality.  Normal external eye and conjunctiva.   Extremities- Warm, dry and intact Musculoskeletal-no joint tenderness, deformity or swelling Skin-warm and dry, no hyperpigmentation, vitiligo, or suspicious lesions    Neuro:  Mental Status: Alert, oriented, thought content appropriate.  Speech fluent without evidence of aphasia.  Able to follow 3 step commands without difficulty. Cranial Nerves: II Visual fields grossly normal,  III,IV, VI: ptosis not present, extra-ocular motions intact bilaterally pupils equal, round, reactive to light and accommodation V,VII: smile symmetric, facial light touch sensation normal bilaterally VIII: hearing normal bilaterally IX,X: uvula rises symmetrically XI: bilateral shoulder shrug XII: midline tongue extension Motor: Bilateral upper extremities 5/5 RLE: 3/5 knee flexion, 4/5 hip extension flexion, knee extension 5/5 knee flexion. No plantar flexion /extension, eversion or inversion BUT at times can extend her right toe LLE: 5/5 all muscle groups.  Decreased ROM passively to ankles bilaterally, worse on the right   Sensory:  Upper extremities normal.  Right lower extremity:              Fine touch decreased anterior aspect but can feel posterior aspect from just above knee to plantar aspect of foot             Temperature decreased from approximately 10 cm below knee to plantar aspect of foot             Proprioception absent to great toe and ankle, decreased to right knee Left lower extremity:             Fine touch normal             Temp decreased along plantar aspect of foot             Proprioception normal   Deep Tendon Reflexes: 2+ and symmetric  throughout UE with 3+ bilateral knee 2+ left ankle and no right AJ Plantars: Right: mute   Left: downgoing Cerebellar: normal finger-to-nose,  Gait: not tested    Medications:  Scheduled: . acidophilus  1 capsule Oral Daily  . carvedilol  12.5 mg Oral BID  . docusate sodium  100 mg Oral BID  . estrogens (conjugated)  0.625 mg Oral Daily  . gabapentin  300 mg Oral TID  . polyethylene glycol  17 g Oral Daily   Continuous: . methocarbamol (ROBAXIN)  IV     ZJI:RCVELFYBOFBPZ **OR** acetaminophen, alum & mag hydroxide-simeth, bisacodyl, diphenhydrAMINE, HYDROmorphone (DILAUDID) injection, LORazepam, menthol-cetylpyridinium **OR** phenol, methocarbamol **OR** methocarbamol (ROBAXIN)  IV, ondansetron **OR** ondansetron (ZOFRAN) IV, oxyCODONE, oxyCODONE, polyethylene glycol, sodium phosphate  Pertinent Labs/Diagnostics:   Mr Jeri Cos Wo Contrast  Result Date: 03/08/2018 CLINICAL DATA:  Lumbar decompression L4-5 03/04/2018. Postop bilateral leg weakness, now improved. Rule out stroke. EXAM: MRI HEAD WITHOUT AND WITH CONTRAST TECHNIQUE: Multiplanar, multiecho pulse sequences of the brain and surrounding structures were obtained without and with intravenous contrast. CONTRAST:  107mL MULTIHANCE GADOBENATE DIMEGLUMINE 529 MG/ML IV SOLN COMPARISON:  None. FINDINGS: Brain: Negative for acute infarct. Scattered small white matter hyperintensities bilaterally mostly in the deep white matter. Left frontal periventricular white matter hyperintensity. Brainstem and cerebellum normal. Negative for hemorrhage or mass. Normal enhancement postcontrast administration. Vascular: Normal arterial  flow void Skull and upper cervical spine: Negative Sinuses/Orbits: Negative Other: None IMPRESSION: No acute abnormality. Mild chronic white matter changes likely due to chronic microvascular ischemia. Migraine headaches and demyelinating disease are other possibilities however the pattern is not typical for demyelinating  disease. Electronically Signed   By: Franchot Gallo M.D.   On: 03/08/2018 09:57   Mr Cervical Spine W Wo Contrast  Result Date: 03/08/2018 CLINICAL DATA:  Lumbar laminectomy L4-5 03/04/2018. Postop bilateral leg weakness. Rule out hematoma or cord compression EXAM: MRI TOTAL SPINE WITHOUT AND WITH CONTRAST TECHNIQUE: Multisequence MR imaging of the spine from the cervical spine to the sacrum was performed prior to and following IV contrast administration for evaluation of spinal metastatic disease. CONTRAST:  71mL MULTIHANCE GADOBENATE DIMEGLUMINE 529 MG/ML IV SOLN COMPARISON:  Lumbar MRI 02/01/2018 FINDINGS: MRI CERVICAL SPINE FINDINGS Alignment: Normal Vertebrae: Normal bone marrow.  Negative for fracture or mass Cord: Spinal cord signal normal. Enhancing vein in the cord at the C1 level without abnormal signal. Possible normal but prominent cord vascularity. Posterior Fossa, vertebral arteries, paraspinal tissues: Negative Disc levels: C2-3: Mild disc degeneration without stenosis C3-4: Mild disc degeneration. Central and left-sided spurring with moderate left foraminal stenosis due to spurring. C4-5: Disc degeneration. Central disc protrusion and associated spurring with cord flattening and mild spinal stenosis. Neural foramina patent bilaterally. C5-6: Small right paracentral disc protrusion. Diffuse uncinate spurring. Cord flattening with moderate spinal stenosis and mild foraminal narrowing bilaterally. C6-7: Central disc protrusion and spurring. Cord flattening with mild spinal stenosis. C7-T1: Negative MRI THORACIC SPINE FINDINGS Alignment:  Normal Vertebrae: Normal bone marrow. Negative for fracture or mass. Normal enhancement. Cord:  Normal cord signal.  No cord compression or cord lesion. Paraspinal and other soft tissues: Negative Disc levels: Multilevel mild thoracic disc degeneration. Mild disc bulging and disc space narrowing and endplate spurring throughout the thoracic spine from T2 through  T12. No significant spinal or foraminal encroachment. No large disc protrusion. MRI LUMBAR SPINE FINDINGS Segmentation:  Normal Alignment:  Normal Vertebrae:  Negative for fracture or mass Conus medullaris: Extends to the L2-3 level and appears normal. Low lying conus medullaris unchanged from the recent MRI. Paraspinal and other soft tissues: Postop fluid posteriorly at L4-5, typical for recent surgery. No mass-effect on the thecal sac. Disc levels: L1-2: Disc degeneration with disc bulging and Schmorl's node. Negative for stenosis L2-3: Disc degeneration with diffuse disc bulging and endplate spurring. Mild spinal stenosis and mild subarticular stenosis bilaterally unchanged from the prior study. L3-4: Moderate facet hypertrophy. Mild disc degeneration. Small amount of subdural fluid surrounds the thecal sac contributing to moderate spinal stenosis which has progressed in the interval. L4-5: Extensive bilateral laminectomy without spinal stenosis. Mild subarticular stenosis bilaterally. Shallow left foraminal disc protrusion with mild left foraminal encroachment unchanged from the prior study. Small postoperative fluid collection posterior to the thecal sac without mass-effect on the thecal sac L5-S1: Mild facet degeneration without stenosis. IMPRESSION: Multilevel cervical spondylosis. Mild spinal stenosis C4-5 and moderate spinal stenosis C5-6. Mild spinal stenosis C6-7 Multilevel thoracic degenerative change without significant stenosis or cord compression Multilevel lumbar degenerative change. Mild spinal stenosis L2-3. Moderate spinal stenosis L3-4 has progressed due to small amount of subdural fluid surrounding the thecal sac. Postop bilateral laminectomy L4-5 with wide decompression thecal sac. Typical postoperative fluid posterior to the thecal sac without significant hematoma or compression of the thecal sac. Left foraminal disc protrusion at L4-5 unchanged from the prior study with mild left foraminal  narrowing. These  results were called by telephone at the time of interpretation on 03/08/2018 at 10:26 am to [name] , who verbally acknowledged these results. Electronically Signed   By: Franchot Gallo M.D.   On: 03/08/2018 10:29   Mr Thoracic Spine W Wo Contrast  Result Date: 03/08/2018 CLINICAL DATA:  Lumbar laminectomy L4-5 03/04/2018. Postop bilateral leg weakness. Rule out hematoma or cord compression EXAM: MRI TOTAL SPINE WITHOUT AND WITH CONTRAST TECHNIQUE: Multisequence MR imaging of the spine from the cervical spine to the sacrum was performed prior to and following IV contrast administration for evaluation of spinal metastatic disease. CONTRAST:  26mL MULTIHANCE GADOBENATE DIMEGLUMINE 529 MG/ML IV SOLN COMPARISON:  Lumbar MRI 02/01/2018 FINDINGS: MRI CERVICAL SPINE FINDINGS Alignment: Normal Vertebrae: Normal bone marrow.  Negative for fracture or mass Cord: Spinal cord signal normal. Enhancing vein in the cord at the C1 level without abnormal signal. Possible normal but prominent cord vascularity. Posterior Fossa, vertebral arteries, paraspinal tissues: Negative Disc levels: C2-3: Mild disc degeneration without stenosis C3-4: Mild disc degeneration. Central and left-sided spurring with moderate left foraminal stenosis due to spurring. C4-5: Disc degeneration. Central disc protrusion and associated spurring with cord flattening and mild spinal stenosis. Neural foramina patent bilaterally. C5-6: Small right paracentral disc protrusion. Diffuse uncinate spurring. Cord flattening with moderate spinal stenosis and mild foraminal narrowing bilaterally. C6-7: Central disc protrusion and spurring. Cord flattening with mild spinal stenosis. C7-T1: Negative MRI THORACIC SPINE FINDINGS Alignment:  Normal Vertebrae: Normal bone marrow. Negative for fracture or mass. Normal enhancement. Cord:  Normal cord signal.  No cord compression or cord lesion. Paraspinal and other soft tissues: Negative Disc levels:  Multilevel mild thoracic disc degeneration. Mild disc bulging and disc space narrowing and endplate spurring throughout the thoracic spine from T2 through T12. No significant spinal or foraminal encroachment. No large disc protrusion. MRI LUMBAR SPINE FINDINGS Segmentation:  Normal Alignment:  Normal Vertebrae:  Negative for fracture or mass Conus medullaris: Extends to the L2-3 level and appears normal. Low lying conus medullaris unchanged from the recent MRI. Paraspinal and other soft tissues: Postop fluid posteriorly at L4-5, typical for recent surgery. No mass-effect on the thecal sac. Disc levels: L1-2: Disc degeneration with disc bulging and Schmorl's node. Negative for stenosis L2-3: Disc degeneration with diffuse disc bulging and endplate spurring. Mild spinal stenosis and mild subarticular stenosis bilaterally unchanged from the prior study. L3-4: Moderate facet hypertrophy. Mild disc degeneration. Small amount of subdural fluid surrounds the thecal sac contributing to moderate spinal stenosis which has progressed in the interval. L4-5: Extensive bilateral laminectomy without spinal stenosis. Mild subarticular stenosis bilaterally. Shallow left foraminal disc protrusion with mild left foraminal encroachment unchanged from the prior study. Small postoperative fluid collection posterior to the thecal sac without mass-effect on the thecal sac L5-S1: Mild facet degeneration without stenosis. IMPRESSION: Multilevel cervical spondylosis. Mild spinal stenosis C4-5 and moderate spinal stenosis C5-6. Mild spinal stenosis C6-7 Multilevel thoracic degenerative change without significant stenosis or cord compression Multilevel lumbar degenerative change. Mild spinal stenosis L2-3. Moderate spinal stenosis L3-4 has progressed due to small amount of subdural fluid surrounding the thecal sac. Postop bilateral laminectomy L4-5 with wide decompression thecal sac. Typical postoperative fluid posterior to the thecal sac  without significant hematoma or compression of the thecal sac. Left foraminal disc protrusion at L4-5 unchanged from the prior study with mild left foraminal narrowing. These results were called by telephone at the time of interpretation on 03/08/2018 at 10:26 am to [name] , who verbally  acknowledged these results. Electronically Signed   By: Franchot Gallo M.D.   On: 03/08/2018 10:29   Mr Lumbar Spine W Wo Contrast  Result Date: 03/08/2018 CLINICAL DATA:  Lumbar laminectomy L4-5 03/04/2018. Postop bilateral leg weakness. Rule out hematoma or cord compression EXAM: MRI TOTAL SPINE WITHOUT AND WITH CONTRAST TECHNIQUE: Multisequence MR imaging of the spine from the cervical spine to the sacrum was performed prior to and following IV contrast administration for evaluation of spinal metastatic disease. CONTRAST:  17mL MULTIHANCE GADOBENATE DIMEGLUMINE 529 MG/ML IV SOLN COMPARISON:  Lumbar MRI 02/01/2018 FINDINGS: MRI CERVICAL SPINE FINDINGS Alignment: Normal Vertebrae: Normal bone marrow.  Negative for fracture or mass Cord: Spinal cord signal normal. Enhancing vein in the cord at the C1 level without abnormal signal. Possible normal but prominent cord vascularity. Posterior Fossa, vertebral arteries, paraspinal tissues: Negative Disc levels: C2-3: Mild disc degeneration without stenosis C3-4: Mild disc degeneration. Central and left-sided spurring with moderate left foraminal stenosis due to spurring. C4-5: Disc degeneration. Central disc protrusion and associated spurring with cord flattening and mild spinal stenosis. Neural foramina patent bilaterally. C5-6: Small right paracentral disc protrusion. Diffuse uncinate spurring. Cord flattening with moderate spinal stenosis and mild foraminal narrowing bilaterally. C6-7: Central disc protrusion and spurring. Cord flattening with mild spinal stenosis. C7-T1: Negative MRI THORACIC SPINE FINDINGS Alignment:  Normal Vertebrae: Normal bone marrow. Negative for fracture  or mass. Normal enhancement. Cord:  Normal cord signal.  No cord compression or cord lesion. Paraspinal and other soft tissues: Negative Disc levels: Multilevel mild thoracic disc degeneration. Mild disc bulging and disc space narrowing and endplate spurring throughout the thoracic spine from T2 through T12. No significant spinal or foraminal encroachment. No large disc protrusion. MRI LUMBAR SPINE FINDINGS Segmentation:  Normal Alignment:  Normal Vertebrae:  Negative for fracture or mass Conus medullaris: Extends to the L2-3 level and appears normal. Low lying conus medullaris unchanged from the recent MRI. Paraspinal and other soft tissues: Postop fluid posteriorly at L4-5, typical for recent surgery. No mass-effect on the thecal sac. Disc levels: L1-2: Disc degeneration with disc bulging and Schmorl's node. Negative for stenosis L2-3: Disc degeneration with diffuse disc bulging and endplate spurring. Mild spinal stenosis and mild subarticular stenosis bilaterally unchanged from the prior study. L3-4: Moderate facet hypertrophy. Mild disc degeneration. Small amount of subdural fluid surrounds the thecal sac contributing to moderate spinal stenosis which has progressed in the interval. L4-5: Extensive bilateral laminectomy without spinal stenosis. Mild subarticular stenosis bilaterally. Shallow left foraminal disc protrusion with mild left foraminal encroachment unchanged from the prior study. Small postoperative fluid collection posterior to the thecal sac without mass-effect on the thecal sac L5-S1: Mild facet degeneration without stenosis. IMPRESSION: Multilevel cervical spondylosis. Mild spinal stenosis C4-5 and moderate spinal stenosis C5-6. Mild spinal stenosis C6-7 Multilevel thoracic degenerative change without significant stenosis or cord compression Multilevel lumbar degenerative change. Mild spinal stenosis L2-3. Moderate spinal stenosis L3-4 has progressed due to small amount of subdural fluid  surrounding the thecal sac. Postop bilateral laminectomy L4-5 with wide decompression thecal sac. Typical postoperative fluid posterior to the thecal sac without significant hematoma or compression of the thecal sac. Left foraminal disc protrusion at L4-5 unchanged from the prior study with mild left foraminal narrowing. These results were called by telephone at the time of interpretation on 03/08/2018 at 10:26 am to [name] , who verbally acknowledged these results. Electronically Signed   By: Franchot Gallo M.D.   On: 03/08/2018 10:29  Etta Quill PA-C [facility] [phone]  Impression: 57 year old female with right lower extremity weakness and sensory loss s/p lumbar surgery x 2 Her exam is unusual with nondermatomal sensory impairment. Her motor impairment is also diffusely suggestive of lower lumbar innervated muscle groups. She is continuing to improve.  I would imagine that she would continue to improve and her symptoms are not because of any clear etiology at this time.  Right-sided addition and some tethering at L4-5 resulting in no acute traction/postop inflammation and scarring within the adhesions might explain some of this weakness, which I have no way to say would improve or not with time but since she has been improving over the past few days, I would imagine that she would continue to improve. Brain MRI does not show any acute strokes.  C-spine MRI does not show any cause for hyperreflexia and is not concerning for any myelopathic changes.  Thoracic spine also is unremarkable.  Neck lumbar spine MRI does not show any area that might require surgical intervention.  Recommendations: --CIR consultation for rehab--who has already seen and recommends home therapy --AFO for drop foot if continues --EMG/NCV in 4-6 weeks if no improvment  03/08/2018, 1:22 PM Attending Neurohospitalist Addendum Patient seen and examined with APP/Resident. Agree with the history and  physical as documented above. Agree with the plan as documented, which I helped formulate. I have independently reviewed the chart, obtained history, review of systems and examined the patient.I have personally reviewed pertinent head/neck/spine imaging (CT/MRI). Please feel free to call with any questions. --- Amie Portland, MD [facility] Pager: [phone]  If 7pm to 7am, please call on call as listed on AMION.

## 2018-03-08 NOTE — Progress Notes (Signed)
Patient complained of nausea and cramping after drinking the magnesium citrate. Patient given zofran IV. Will continue to monitor.

## 2018-03-08 NOTE — Progress Notes (Signed)
Physical Therapy Treatment Patient Details Name: [name] MRN: [id number] DOB: May 05, 1961 Today's Date: 03/08/2018    History of Present Illness Pt is a 57 y/o female s/p Revision of Microlumbar Decompression Bilateral Lumbar Four-Five (Bilateral) and Central decompression at L4-5 with removal of partial spinous process of 4 and 5.  Exploration of the wound, evacuation of hematoma. PMH including but not limited to HTN.    PT Comments    Pt up in chair on arrival. She required min A to rise into standing and min A for gait. Pt very fatigued during ambulation, requiring assist to negotiate obstacles in hallway. Pt with c/o dizziness while ambulating and requiring seated break. Vitals taken, BP 153/90, HR 69 BPM, SpO2 99%. Rn informed of pt's elevated BP and dizziness. Will continue to follow acutely to maximize pt's functional independence and activity tolerance.    Follow Up Recommendations  Home health PT;Supervision/Assistance - 24 hour     Equipment Recommendations  Rolling walker with 5" wheels;3in1 (PT)    Recommendations for Other Services       Precautions / Restrictions Precautions Precautions: Fall;Back Precaution Comments: No brace ordered. AFO for R foot drop. Required Braces or Orthoses: Other Brace/Splint Other Brace/Splint: R MAFO, "no brace back needed" Restrictions Weight Bearing Restrictions: No    Mobility  Bed Mobility Overal bed mobility: Needs Assistance             General bed mobility comments: up in chair on arrival  Transfers Overall transfer level: Needs assistance Equipment used: Rolling walker (2 wheeled) Transfers: Sit to/from Stand Sit to Stand: Min assist         General transfer comment: min A to rise from recliner with use of arm rests. Cues for hand placement.  Ambulation/Gait Ambulation/Gait assistance: Min assist Ambulation Distance (Feet): 50 Feet Assistive device: Rolling walker (2 wheeled) Gait  Pattern/deviations: Step-to pattern;Decreased step length - right;Decreased dorsiflexion - right;Trunk flexed Gait velocity: decreased   General Gait Details: One standing rest break during gait secondary to increased dizziness. VC for posture and increased step length. After 50 ft pt required seated break due to dizziness and c/o room spinning. Returned to room and vitals taken.   Stairs             Wheelchair Mobility    Modified Rankin (Stroke Patients Only)       Balance Overall balance assessment: Needs assistance Sitting-balance support: Feet supported;Bilateral upper extremity supported Sitting balance-Leahy Scale: Fair     Standing balance support: Bilateral upper extremity supported Standing balance-Leahy Scale: Poor Standing balance comment: Relies on BUE support.                             Cognition Arousal/Alertness: Awake/alert Behavior During Therapy: WFL for tasks assessed/performed Overall Cognitive Status: Within Functional Limits for tasks assessed                                        Exercises      General Comments General comments (skin integrity, edema, etc.): son present and involved in session      Pertinent Vitals/Pain Pain Assessment: Faces Faces Pain Scale: Hurts little more Pain Location: R side low back  Pain Descriptors / Indicators: Guarding;Sore Pain Intervention(s): Monitored during session;Limited activity within patient's tolerance    Home Living  Prior Function            PT Goals (current goals can now be found in the care plan section) Acute Rehab PT Goals Patient Stated Goal: decrease pain PT Goal Formulation: With patient/family Time For Goal Achievement: 03/19/18 Potential to Achieve Goals: Good Progress towards PT goals: Progressing toward goals    Frequency    Min 5X/week      PT Plan Current plan remains appropriate    Co-evaluation               AM-PAC PT "6 Clicks" Daily Activity  Outcome Measure  Difficulty turning over in bed (including adjusting bedclothes, sheets and blankets)?: Unable Difficulty moving from lying on back to sitting on the side of the bed? : Unable Difficulty sitting down on and standing up from a chair with arms (e.g., wheelchair, bedside commode, etc,.)?: Unable Help needed moving to and from a bed to chair (including a wheelchair)?: A Little Help needed walking in hospital room?: A Little Help needed climbing 3-5 steps with a railing? : A Lot 6 Click Score: 11    End of Session Equipment Utilized During Treatment: Gait belt Activity Tolerance: Other (comment)(limited by dizziness) Patient left: in chair;with call bell/phone within reach;with family/visitor present;with nursing/sitter in room Nurse Communication: Mobility status;Other (comment)(elevated BP and c/o dizziness) PT Visit Diagnosis: Other abnormalities of gait and mobility (R26.89);Pain Pain - part of body: (back)     Time: 7673-4193 PT Time Calculation (min) (ACUTE ONLY): 27 min  Charges:  $Gait Training: 23-37 mins                    G Codes:       Benjiman Core, [place] Pager [phone] Acute Rehab   Allena Katz 03/08/2018, 1:51 PM

## 2018-03-08 NOTE — Progress Notes (Signed)
Subjective: 4 Days Post-Op Procedure(s) (LRB): EXPLORATION OF LUMBAR DECOMPRESSION WOUND (N/A) Patient reports pain as 4 on 0-10 scale.   No BM Good flatus Objective: Vital signs in last 24 hours: Temp:  [97.7 F (36.5 C)-98.2 F (36.8 C)] 97.8 F (36.6 C) (05/13 1358) Pulse Rate:  [68-85] 78 (05/13 1358) Resp:  [12-18] 12 (05/13 1358) BP: (118-142)/(61-75) 133/70 (05/13 1358) SpO2:  [97 %-100 %] 99 % (05/13 1358)  Intake/Output from previous day: 05/12 0701 - 05/13 0700 In: 720 [P.O.:720] Out: -  Intake/Output this shift: Total I/O In: 240 [P.O.:240] Out: -   Recent Labs    03/06/18 0326  HGB 10.0*   Recent Labs    03/06/18 0326  WBC 16.8*  RBC 3.41*  HCT 31.0*  PLT 214   Recent Labs    03/06/18 0326  NA 142  K 4.1  CL 107  CO2 31  BUN 11  CREATININE 0.92  GLUCOSE 118*  CALCIUM 8.6*   No results for input(s): LABPT, INR in the last 72 hours.  Incision: scant drainage  Anticipated LOS equal to or greater than 2 midnights due to - Age 38 and older with one or more of the following:  - Obesity  - Expected need for hospital services (PT, OT, Nursing) required for safe  discharge  - Anticipated need for postoperative skilled nursing care or inpatient rehab  - Active co-morbidities: Chronic pain requiring opiods OR   - Unanticipated findings during/Post Surgery: Slow post-op progression: GI, pain control, mobility  -  Assessment/Plan: 4 Days Post-Op Procedure(s) (LRB): EXPLORATION OF LUMBAR DECOMPRESSION WOUND (N/A)  Discussed MRI with Dr. Carlis Abbott and Neurologist. No active nerve compression Requiring decompression. Some increased sensation below knee. EHL2+. Does not hold up against gravity today. Needs BM. Fleets after Mag citrate Dulcolox.  Discussed with patient. Improving Transfer to Rehab   Allayah Raineri C 03/08/2018, 2:23 PM

## 2018-03-09 ENCOUNTER — Encounter (HOSPITAL_COMMUNITY): Payer: Self-pay | Admitting: *Deleted

## 2018-03-09 ENCOUNTER — Other Ambulatory Visit: Payer: Self-pay

## 2018-03-09 ENCOUNTER — Inpatient Hospital Stay (HOSPITAL_COMMUNITY)
Admission: RE | Admit: 2018-03-09 | Discharge: 2018-03-19 | DRG: 560 | Disposition: A | Discharge status: Home Health | Payer: 59 | Source: Intra-hospital | Attending: Physical Medicine & Rehabilitation | Admitting: Physical Medicine & Rehabilitation

## 2018-03-09 DIAGNOSIS — F339 Major depressive disorder, recurrent, unspecified: Secondary | ICD-10-CM

## 2018-03-09 DIAGNOSIS — Z90710 Acquired absence of both cervix and uterus: Secondary | ICD-10-CM

## 2018-03-09 DIAGNOSIS — Z807 Family history of other malignant neoplasms of lymphoid, hematopoietic and related tissues: Secondary | ICD-10-CM

## 2018-03-09 DIAGNOSIS — Z884 Allergy status to anesthetic agent status: Secondary | ICD-10-CM

## 2018-03-09 DIAGNOSIS — D62 Acute posthemorrhagic anemia: Secondary | ICD-10-CM | POA: Diagnosis not present

## 2018-03-09 DIAGNOSIS — Z4789 Encounter for other orthopedic aftercare: Principal | ICD-10-CM

## 2018-03-09 DIAGNOSIS — Z880 Allergy status to penicillin: Secondary | ICD-10-CM

## 2018-03-09 DIAGNOSIS — Z800 Family history of malignant neoplasm of digestive organs: Secondary | ICD-10-CM

## 2018-03-09 DIAGNOSIS — G8918 Other acute postprocedural pain: Secondary | ICD-10-CM | POA: Diagnosis present

## 2018-03-09 DIAGNOSIS — N179 Acute kidney failure, unspecified: Secondary | ICD-10-CM | POA: Diagnosis not present

## 2018-03-09 DIAGNOSIS — Z881 Allergy status to other antibiotic agents status: Secondary | ICD-10-CM

## 2018-03-09 DIAGNOSIS — K5900 Constipation, unspecified: Secondary | ICD-10-CM | POA: Diagnosis not present

## 2018-03-09 DIAGNOSIS — Z91040 Latex allergy status: Secondary | ICD-10-CM

## 2018-03-09 DIAGNOSIS — M48061 Spinal stenosis, lumbar region without neurogenic claudication: Secondary | ICD-10-CM | POA: Diagnosis present

## 2018-03-09 DIAGNOSIS — I10 Essential (primary) hypertension: Secondary | ICD-10-CM | POA: Diagnosis not present

## 2018-03-09 DIAGNOSIS — Z801 Family history of malignant neoplasm of trachea, bronchus and lung: Secondary | ICD-10-CM

## 2018-03-09 DIAGNOSIS — Z91018 Allergy to other foods: Secondary | ICD-10-CM

## 2018-03-09 DIAGNOSIS — M5416 Radiculopathy, lumbar region: Secondary | ICD-10-CM

## 2018-03-09 DIAGNOSIS — F418 Other specified anxiety disorders: Secondary | ICD-10-CM | POA: Diagnosis not present

## 2018-03-09 DIAGNOSIS — Z803 Family history of malignant neoplasm of breast: Secondary | ICD-10-CM

## 2018-03-09 DIAGNOSIS — G8220 Paraplegia, unspecified: Secondary | ICD-10-CM

## 2018-03-09 DIAGNOSIS — R262 Difficulty in walking, not elsewhere classified: Secondary | ICD-10-CM | POA: Diagnosis present

## 2018-03-09 DIAGNOSIS — Z808 Family history of malignant neoplasm of other organs or systems: Secondary | ICD-10-CM

## 2018-03-09 DIAGNOSIS — Z87828 Personal history of other (healed) physical injury and trauma: Secondary | ICD-10-CM

## 2018-03-09 DIAGNOSIS — R269 Unspecified abnormalities of gait and mobility: Secondary | ICD-10-CM | POA: Diagnosis not present

## 2018-03-09 DIAGNOSIS — F411 Generalized anxiety disorder: Secondary | ICD-10-CM | POA: Diagnosis not present

## 2018-03-09 LAB — CBC WITH DIFFERENTIAL/PLATELET
Abs Immature Granulocytes: 0.2 10*3/uL — ABNORMAL HIGH (ref 0.0–0.1)
BASOS ABS: 0 10*3/uL (ref 0.0–0.1)
BASOS PCT: 0 %
EOS PCT: 1 %
Eosinophils Absolute: 0.1 10*3/uL (ref 0.0–0.7)
HCT: 31.8 % — ABNORMAL LOW (ref 36.0–46.0)
Hemoglobin: 10 g/dL — ABNORMAL LOW (ref 12.0–15.0)
Immature Granulocytes: 3 %
LYMPHS PCT: 26 %
Lymphs Abs: 2 10*3/uL (ref 0.7–4.0)
MCH: 29.2 pg (ref 26.0–34.0)
MCHC: 31.4 g/dL (ref 30.0–36.0)
MCV: 93 fL (ref 78.0–100.0)
MONO ABS: 0.7 10*3/uL (ref 0.1–1.0)
Monocytes Relative: 9 %
NEUTROS ABS: 4.7 10*3/uL (ref 1.7–7.7)
Neutrophils Relative %: 61 %
PLATELETS: 236 10*3/uL (ref 150–400)
RBC: 3.42 MIL/uL — AB (ref 3.87–5.11)
RDW: 13.2 % (ref 11.5–15.5)
WBC: 7.7 10*3/uL (ref 4.0–10.5)

## 2018-03-09 MED ORDER — METHOCARBAMOL 500 MG PO TABS
500.0000 mg | ORAL_TABLET | Freq: Four times a day (QID) | ORAL | Status: DC | PRN
  Administered 2018-03-10 – 2018-03-19 (×6): 500 mg via ORAL
  Filled 2018-03-09 (×6): qty 1

## 2018-03-09 MED ORDER — GUAIFENESIN-DM 100-10 MG/5ML PO SYRP: 5.0000 mL | ORAL_SOLUTION | Freq: Four times a day (QID) | ORAL | Status: DC | PRN

## 2018-03-09 MED ORDER — GABAPENTIN 300 MG PO CAPS
300.0000 mg | ORAL_CAPSULE | Freq: Three times a day (TID) | ORAL | Status: DC
  Administered 2018-03-09 – 2018-03-19 (×29): 300 mg via ORAL
  Filled 2018-03-09 (×29): qty 1

## 2018-03-09 MED ORDER — RISAQUAD PO CAPS
1.0000 | ORAL_CAPSULE | Freq: Every day | ORAL | Status: DC
  Administered 2018-03-10 – 2018-03-19 (×10): 1 via ORAL
  Filled 2018-03-09 (×10): qty 1

## 2018-03-09 MED ORDER — LORAZEPAM 0.5 MG PO TABS: 0.5000 mg | ORAL_TABLET | Freq: Four times a day (QID) | ORAL | Status: AC | PRN

## 2018-03-09 MED ORDER — ALUM & MAG HYDROXIDE-SIMETH 200-200-20 MG/5ML PO SUSP: 30.0000 mL | ORAL | Status: DC | PRN

## 2018-03-09 MED ORDER — CARVEDILOL 12.5 MG PO TABS
12.5000 mg | ORAL_TABLET | Freq: Two times a day (BID) | ORAL | Status: DC
  Administered 2018-03-09 – 2018-03-15 (×12): 12.5 mg via ORAL
  Filled 2018-03-09 (×12): qty 1

## 2018-03-09 MED ORDER — FLEET ENEMA 7-19 GM/118ML RE ENEM: 1.0000 | ENEMA | Freq: Once | RECTAL | Status: DC | PRN

## 2018-03-09 MED ORDER — BISACODYL 5 MG PO TBEC
5.0000 mg | DELAYED_RELEASE_TABLET | Freq: Every day | ORAL | Status: DC | PRN
  Administered 2018-03-09 – 2018-03-11 (×2): 5 mg via ORAL
  Filled 2018-03-09 (×2): qty 1

## 2018-03-09 MED ORDER — POLYETHYLENE GLYCOL 3350 17 G PO PACK
17.0000 g | PACK | Freq: Every day | ORAL | Status: DC | PRN
  Administered 2018-03-16: 17 g via ORAL
  Filled 2018-03-09: qty 1

## 2018-03-09 MED ORDER — LORAZEPAM 0.5 MG PO TABS
0.5000 mg | ORAL_TABLET | Freq: Four times a day (QID) | ORAL | Status: DC | PRN
  Administered 2018-03-09: 0.5 mg via ORAL
  Filled 2018-03-09: qty 1

## 2018-03-09 MED ORDER — OXYCODONE HCL 5 MG PO TABS
5.0000 mg | ORAL_TABLET | ORAL | Status: DC | PRN
  Administered 2018-03-09 – 2018-03-14 (×5): 5 mg via ORAL
  Filled 2018-03-09 (×5): qty 1

## 2018-03-09 MED ORDER — DEXTROSE 5 % IV SOLN: 500.0000 mg | Freq: Four times a day (QID) | INTRAVENOUS | Status: DC | PRN

## 2018-03-09 MED ORDER — BISACODYL 10 MG RE SUPP: 10.0000 mg | Freq: Every day | RECTAL | Status: DC | PRN

## 2018-03-09 MED ORDER — TRAMADOL HCL 50 MG PO TABS
50.0000 mg | ORAL_TABLET | Freq: Four times a day (QID) | ORAL | Status: DC | PRN
  Administered 2018-03-10 – 2018-03-17 (×13): 50 mg via ORAL
  Filled 2018-03-09 (×16): qty 1

## 2018-03-09 MED ORDER — PROCHLORPERAZINE 25 MG RE SUPP: 12.5000 mg | Freq: Four times a day (QID) | RECTAL | Status: DC | PRN

## 2018-03-09 MED ORDER — ALUM & MAG HYDROXIDE-SIMETH 200-200-20 MG/5ML PO SUSP: 30.0000 mL | Freq: Four times a day (QID) | ORAL | Status: DC | PRN

## 2018-03-09 MED ORDER — PROCHLORPERAZINE MALEATE 5 MG PO TABS
5.0000 mg | ORAL_TABLET | Freq: Four times a day (QID) | ORAL | Status: DC | PRN
  Administered 2018-03-10: 5 mg via ORAL
  Administered 2018-03-10: 10 mg via ORAL
  Administered 2018-03-11: 5 mg via ORAL
  Administered 2018-03-12 – 2018-03-19 (×3): 10 mg via ORAL
  Filled 2018-03-09 (×5): qty 2
  Filled 2018-03-09 (×2): qty 1

## 2018-03-09 MED ORDER — DOCUSATE SODIUM 100 MG PO CAPS
100.0000 mg | ORAL_CAPSULE | Freq: Two times a day (BID) | ORAL | Status: DC
  Administered 2018-03-09 – 2018-03-19 (×20): 100 mg via ORAL
  Filled 2018-03-09 (×20): qty 1

## 2018-03-09 MED ORDER — POLYETHYLENE GLYCOL 3350 17 G PO PACK
17.0000 g | PACK | Freq: Every day | ORAL | Status: DC
  Administered 2018-03-10 – 2018-03-18 (×9): 17 g via ORAL
  Filled 2018-03-09 (×10): qty 1

## 2018-03-09 MED ORDER — ACETAMINOPHEN 325 MG PO TABS
325.0000 mg | ORAL_TABLET | ORAL | Status: DC | PRN
  Administered 2018-03-11 (×2): 650 mg via ORAL
  Filled 2018-03-09 (×2): qty 2

## 2018-03-09 MED ORDER — MENTHOL 3 MG MT LOZG
1.0000 | LOZENGE | OROMUCOSAL | Status: DC | PRN
  Filled 2018-03-09: qty 9

## 2018-03-09 MED ORDER — ESTROGENS CONJUGATED 0.625 MG PO TABS
0.6250 mg | ORAL_TABLET | Freq: Every day | ORAL | Status: DC
  Administered 2018-03-10 – 2018-03-19 (×10): 0.625 mg via ORAL
  Filled 2018-03-09 (×10): qty 1

## 2018-03-09 MED ORDER — PROCHLORPERAZINE EDISYLATE 10 MG/2ML IJ SOLN: 5.0000 mg | Freq: Four times a day (QID) | INTRAMUSCULAR | Status: DC | PRN

## 2018-03-09 MED ORDER — TRAZODONE HCL 50 MG PO TABS: 25.0000 mg | ORAL_TABLET | Freq: Every evening | ORAL | Status: DC | PRN

## 2018-03-09 MED ORDER — PHENOL 1.4 % MT LIQD: 1.0000 | OROMUCOSAL | Status: DC | PRN

## 2018-03-09 MED ORDER — OXYCODONE HCL 5 MG PO TABS
10.0000 mg | ORAL_TABLET | ORAL | Status: DC | PRN
  Administered 2018-03-10 – 2018-03-19 (×11): 10 mg via ORAL
  Filled 2018-03-09 (×13): qty 2

## 2018-03-09 MED ORDER — DIPHENHYDRAMINE HCL 12.5 MG/5ML PO ELIX
12.5000 mg | ORAL_SOLUTION | Freq: Four times a day (QID) | ORAL | Status: DC | PRN
  Administered 2018-03-09 – 2018-03-12 (×5): 25 mg via ORAL
  Administered 2018-03-13: 12.5 mg via ORAL
  Administered 2018-03-14 – 2018-03-18 (×6): 25 mg via ORAL
  Filled 2018-03-09 (×9): qty 10
  Filled 2018-03-09: qty 0
  Filled 2018-03-09 (×2): qty 10

## 2018-03-09 NOTE — Progress Notes (Signed)
Inpatient Rehabilitation  I have received insurance authorization for an IP Rehab admission as well as medical clearance to proceed with an IP Rehab admission.  Discussed with team.  Call if questions.   Carmelia Roller., CCC/SLP Admission Coordinator  [place]  Cell [phone]

## 2018-03-09 NOTE — Discharge Summary (Addendum)
Physician Discharge Summary   Patient ID: [name] MRN: [id number] DOB/AGE: 01/18/1961 57 y.o.  Admit date: 03/04/2018 Discharge date: 03/09/2018  Primary Diagnosis:   spinal stenosis  Admission Diagnoses:  Past Medical History:  Diagnosis Date  . Anxiety   . Back pain    related to spinal stenosis and disc problem, radiates down left buttocks to leg., weakness occ.  [name] Headache   . Hypertension   . PONV (postoperative nausea and vomiting)   . Vaginal foreign object    "Uses Femring"   Discharge Diagnoses:   Principal Problem:   Spinal stenosis of lumbar region Active Problems:   Spinal stenosis at L4-L5 level   Essential hypertension   Chronic nonintractable headache   Leukocytosis   Acute blood loss anemia   Postoperative pain   Generalized weakness  Procedure:  Revision Microlumbar decompression L4-5 bilateral Procedure(s) (LRB): EXPLORATION OF LUMBAR DECOMPRESSION WOUND (N/A)   Consults: neurology  HPI:  see H&P. Patient did have multiple falls and weakness in the lower extremities pre-operatively. Her condition had progressed from the time between her last outpatient followup and admission for surgery.    Laboratory Data: Hospital Outpatient Visit on 02/23/2018  Component Date Value Ref Range Status  . Sodium 02/23/2018 138  135 - 145 mmol/L Final  . Potassium 02/23/2018 3.8  3.5 - 5.1 mmol/L Final  . Chloride 02/23/2018 102  101 - 111 mmol/L Final  . CO2 02/23/2018 28  22 - 32 mmol/L Final  . Glucose, Bld 02/23/2018 62* 65 - 99 mg/dL Final  . BUN 02/23/2018 14  6 - 20 mg/dL Final  . Creatinine, Ser 02/23/2018 0.99  0.44 - 1.00 mg/dL Final  . Calcium 02/23/2018 9.5  8.9 - 10.3 mg/dL Final  . GFR calc non Af Amer 02/23/2018 >60  >60 mL/min Final  . GFR calc Af Amer 02/23/2018 >60  >60 mL/min Final   Comment: (NOTE) The eGFR has been calculated using the CKD EPI equation. This calculation has not been validated in all clinical situations. eGFR's  persistently <60 mL/min signify possible Chronic Kidney Disease.   Georgiann Hahn gap 02/23/2018 8  5 - 15 Final   Performed at [facility], [place] [address] [place], [place] [address]  . WBC 02/23/2018 3.6* 4.0 - 10.5 K/uL Final  . RBC 02/23/2018 4.50  3.87 - 5.11 MIL/uL Final  . Hemoglobin 02/23/2018 13.5  12.0 - 15.0 g/dL Final  . HCT 02/23/2018 40.3  36.0 - 46.0 % Final  . MCV 02/23/2018 89.6  78.0 - 100.0 fL Final  . MCH 02/23/2018 30.0  26.0 - 34.0 pg Final  . MCHC 02/23/2018 33.5  30.0 - 36.0 g/dL Final  . RDW 02/23/2018 12.9  11.5 - 15.5 % Final  . Platelets 02/23/2018 257  150 - 400 K/uL Final   Performed at [facility], [place] [address] [place], [place] [address]  . MRSA, PCR 02/23/2018 NEGATIVE  NEGATIVE Final  . Staphylococcus aureus 02/23/2018 NEGATIVE  NEGATIVE Final   Comment: (NOTE) The Xpert SA Assay (FDA approved for NASAL specimens in patients 29 years of age and older), is one component of a comprehensive surveillance program. It is not intended to diagnose infection nor to guide or monitor treatment. Performed at [facility], [place] [address] [place], [place] [address]    Recent Labs    03/09/18 1444  HGB 10.0*   Recent Labs    03/09/18 1444  WBC 7.7  RBC  3.42*  HCT 31.8*  PLT 236   No results for input(s): NA, K, CL, CO2, BUN, CREATININE, GLUCOSE, CALCIUM in the last 72 hours. No results for input(s): LABPT, INR in the last 72 hours.  X-Rays:Dg Lumbar Spine 2-3 Views  Result Date: 03/04/2018 CLINICAL DATA:  57 year old female with a history of lumbar surgery. EXAM: LUMBAR SPINE - 2-3 VIEW COMPARISON:  02/23/2018 FINDINGS: Series of intraoperative cross-table lateral images of the lumbar spine. Initial image demonstrates fiducial needles overlying the L4 and L5 spinous process. Subsequently, there is placement of tissue retractor at the L5 spinous process, and a surgical curette identifying the L5 foramen. IMPRESSION: Intraoperative  cross-table lateral of the lumbar spine, as above. Electronically Signed   By: Corrie Mckusick D.O.   On: 03/04/2018 12:54   Dg Lumbar Spine 2-3 Views  Result Date: 02/23/2018 CLINICAL DATA:  Preoperative for lumbar surgery. EXAM: LUMBAR SPINE - 2-3 VIEW COMPARISON:  12/28/2015 lumbar spine radiographs FINDINGS: This report assumes 5 non rib-bearing lumbar vertebrae. Lumbar vertebral body heights are preserved, with no fracture. Mild multilevel lumbar degenerative disc disease, most prominent at L2-3, not convincingly changed. No spondylolisthesis. No significant facet arthropathy. No aggressive appearing focal osseous lesions. Abdominal aortic atherosclerosis. IMPRESSION: Mild multilevel lumbar degenerative disc disease. No acute osseous abnormality. Electronically Signed   By: Ilona Sorrel M.D.   On: 02/23/2018 16:39   Mr Jeri Cos LO Contrast  Result Date: 03/08/2018 CLINICAL DATA:  Lumbar decompression L4-5 03/04/2018. Postop bilateral leg weakness, now improved. Rule out stroke. EXAM: MRI HEAD WITHOUT AND WITH CONTRAST TECHNIQUE: Multiplanar, multiecho pulse sequences of the brain and surrounding structures were obtained without and with intravenous contrast. CONTRAST:  28m MULTIHANCE GADOBENATE DIMEGLUMINE 529 MG/ML IV SOLN COMPARISON:  None. FINDINGS: Brain: Negative for acute infarct. Scattered small white matter hyperintensities bilaterally mostly in the deep white matter. Left frontal periventricular white matter hyperintensity. Brainstem and cerebellum normal. Negative for hemorrhage or mass. Normal enhancement postcontrast administration. Vascular: Normal arterial flow void Skull and upper cervical spine: Negative Sinuses/Orbits: Negative Other: None IMPRESSION: No acute abnormality. Mild chronic white matter changes likely due to chronic microvascular ischemia. Migraine headaches and demyelinating disease are other possibilities however the pattern is not typical for demyelinating disease.  Electronically Signed   By: CFranchot GalloM.D.   On: 03/08/2018 09:57   Mr Cervical Spine W Wo Contrast  Result Date: 03/08/2018 CLINICAL DATA:  Lumbar laminectomy L4-5 03/04/2018. Postop bilateral leg weakness. Rule out hematoma or cord compression EXAM: MRI TOTAL SPINE WITHOUT AND WITH CONTRAST TECHNIQUE: Multisequence MR imaging of the spine from the cervical spine to the sacrum was performed prior to and following IV contrast administration for evaluation of spinal metastatic disease. CONTRAST:  18mMULTIHANCE GADOBENATE DIMEGLUMINE 529 MG/ML IV SOLN COMPARISON:  Lumbar MRI 02/01/2018 FINDINGS: MRI CERVICAL SPINE FINDINGS Alignment: Normal Vertebrae: Normal bone marrow.  Negative for fracture or mass Cord: Spinal cord signal normal. Enhancing vein in the cord at the C1 level without abnormal signal. Possible normal but prominent cord vascularity. Posterior Fossa, vertebral arteries, paraspinal tissues: Negative Disc levels: C2-3: Mild disc degeneration without stenosis C3-4: Mild disc degeneration. Central and left-sided spurring with moderate left foraminal stenosis due to spurring. C4-5: Disc degeneration. Central disc protrusion and associated spurring with cord flattening and mild spinal stenosis. Neural foramina patent bilaterally. C5-6: Small right paracentral disc protrusion. Diffuse uncinate spurring. Cord flattening with moderate spinal stenosis and mild foraminal narrowing bilaterally. C6-7: Central disc protrusion and spurring. Cord flattening  with mild spinal stenosis. C7-T1: Negative MRI THORACIC SPINE FINDINGS Alignment:  Normal Vertebrae: Normal bone marrow. Negative for fracture or mass. Normal enhancement. Cord:  Normal cord signal.  No cord compression or cord lesion. Paraspinal and other soft tissues: Negative Disc levels: Multilevel mild thoracic disc degeneration. Mild disc bulging and disc space narrowing and endplate spurring throughout the thoracic spine from T2 through T12. No  significant spinal or foraminal encroachment. No large disc protrusion. MRI LUMBAR SPINE FINDINGS Segmentation:  Normal Alignment:  Normal Vertebrae:  Negative for fracture or mass Conus medullaris: Extends to the L2-3 level and appears normal. Low lying conus medullaris unchanged from the recent MRI. Paraspinal and other soft tissues: Postop fluid posteriorly at L4-5, typical for recent surgery. No mass-effect on the thecal sac. Disc levels: L1-2: Disc degeneration with disc bulging and Schmorl's node. Negative for stenosis L2-3: Disc degeneration with diffuse disc bulging and endplate spurring. Mild spinal stenosis and mild subarticular stenosis bilaterally unchanged from the prior study. L3-4: Moderate facet hypertrophy. Mild disc degeneration. Small amount of subdural fluid surrounds the thecal sac contributing to moderate spinal stenosis which has progressed in the interval. L4-5: Extensive bilateral laminectomy without spinal stenosis. Mild subarticular stenosis bilaterally. Shallow left foraminal disc protrusion with mild left foraminal encroachment unchanged from the prior study. Small postoperative fluid collection posterior to the thecal sac without mass-effect on the thecal sac L5-S1: Mild facet degeneration without stenosis. IMPRESSION: Multilevel cervical spondylosis. Mild spinal stenosis C4-5 and moderate spinal stenosis C5-6. Mild spinal stenosis C6-7 Multilevel thoracic degenerative change without significant stenosis or cord compression Multilevel lumbar degenerative change. Mild spinal stenosis L2-3. Moderate spinal stenosis L3-4 has progressed due to small amount of subdural fluid surrounding the thecal sac. Postop bilateral laminectomy L4-5 with wide decompression thecal sac. Typical postoperative fluid posterior to the thecal sac without significant hematoma or compression of the thecal sac. Left foraminal disc protrusion at L4-5 unchanged from the prior study with mild left foraminal narrowing.  These results were called by telephone at the time of interpretation on 03/08/2018 at 10:26 am to [name] , who verbally acknowledged these results. Electronically Signed   By: Franchot Gallo M.D.   On: 03/08/2018 10:29   Mr Thoracic Spine W Wo Contrast  Result Date: 03/08/2018 CLINICAL DATA:  Lumbar laminectomy L4-5 03/04/2018. Postop bilateral leg weakness. Rule out hematoma or cord compression EXAM: MRI TOTAL SPINE WITHOUT AND WITH CONTRAST TECHNIQUE: Multisequence MR imaging of the spine from the cervical spine to the sacrum was performed prior to and following IV contrast administration for evaluation of spinal metastatic disease. CONTRAST:  32m MULTIHANCE GADOBENATE DIMEGLUMINE 529 MG/ML IV SOLN COMPARISON:  Lumbar MRI 02/01/2018 FINDINGS: MRI CERVICAL SPINE FINDINGS Alignment: Normal Vertebrae: Normal bone marrow.  Negative for fracture or mass Cord: Spinal cord signal normal. Enhancing vein in the cord at the C1 level without abnormal signal. Possible normal but prominent cord vascularity. Posterior Fossa, vertebral arteries, paraspinal tissues: Negative Disc levels: C2-3: Mild disc degeneration without stenosis C3-4: Mild disc degeneration. Central and left-sided spurring with moderate left foraminal stenosis due to spurring. C4-5: Disc degeneration. Central disc protrusion and associated spurring with cord flattening and mild spinal stenosis. Neural foramina patent bilaterally. C5-6: Small right paracentral disc protrusion. Diffuse uncinate spurring. Cord flattening with moderate spinal stenosis and mild foraminal narrowing bilaterally. C6-7: Central disc protrusion and spurring. Cord flattening with mild spinal stenosis. C7-T1: Negative MRI THORACIC SPINE FINDINGS Alignment:  Normal Vertebrae: Normal bone marrow. Negative for fracture or  mass. Normal enhancement. Cord:  Normal cord signal.  No cord compression or cord lesion. Paraspinal and other soft tissues: Negative Disc levels: Multilevel mild  thoracic disc degeneration. Mild disc bulging and disc space narrowing and endplate spurring throughout the thoracic spine from T2 through T12. No significant spinal or foraminal encroachment. No large disc protrusion. MRI LUMBAR SPINE FINDINGS Segmentation:  Normal Alignment:  Normal Vertebrae:  Negative for fracture or mass Conus medullaris: Extends to the L2-3 level and appears normal. Low lying conus medullaris unchanged from the recent MRI. Paraspinal and other soft tissues: Postop fluid posteriorly at L4-5, typical for recent surgery. No mass-effect on the thecal sac. Disc levels: L1-2: Disc degeneration with disc bulging and Schmorl's node. Negative for stenosis L2-3: Disc degeneration with diffuse disc bulging and endplate spurring. Mild spinal stenosis and mild subarticular stenosis bilaterally unchanged from the prior study. L3-4: Moderate facet hypertrophy. Mild disc degeneration. Small amount of subdural fluid surrounds the thecal sac contributing to moderate spinal stenosis which has progressed in the interval. L4-5: Extensive bilateral laminectomy without spinal stenosis. Mild subarticular stenosis bilaterally. Shallow left foraminal disc protrusion with mild left foraminal encroachment unchanged from the prior study. Small postoperative fluid collection posterior to the thecal sac without mass-effect on the thecal sac L5-S1: Mild facet degeneration without stenosis. IMPRESSION: Multilevel cervical spondylosis. Mild spinal stenosis C4-5 and moderate spinal stenosis C5-6. Mild spinal stenosis C6-7 Multilevel thoracic degenerative change without significant stenosis or cord compression Multilevel lumbar degenerative change. Mild spinal stenosis L2-3. Moderate spinal stenosis L3-4 has progressed due to small amount of subdural fluid surrounding the thecal sac. Postop bilateral laminectomy L4-5 with wide decompression thecal sac. Typical postoperative fluid posterior to the thecal sac without significant  hematoma or compression of the thecal sac. Left foraminal disc protrusion at L4-5 unchanged from the prior study with mild left foraminal narrowing. These results were called by telephone at the time of interpretation on 03/08/2018 at 10:26 am to [name] , who verbally acknowledged these results. Electronically Signed   By: Franchot Gallo M.D.   On: 03/08/2018 10:29   Mr Lumbar Spine W Wo Contrast  Result Date: 03/08/2018 CLINICAL DATA:  Lumbar laminectomy L4-5 03/04/2018. Postop bilateral leg weakness. Rule out hematoma or cord compression EXAM: MRI TOTAL SPINE WITHOUT AND WITH CONTRAST TECHNIQUE: Multisequence MR imaging of the spine from the cervical spine to the sacrum was performed prior to and following IV contrast administration for evaluation of spinal metastatic disease. CONTRAST:  84m MULTIHANCE GADOBENATE DIMEGLUMINE 529 MG/ML IV SOLN COMPARISON:  Lumbar MRI 02/01/2018 FINDINGS: MRI CERVICAL SPINE FINDINGS Alignment: Normal Vertebrae: Normal bone marrow.  Negative for fracture or mass Cord: Spinal cord signal normal. Enhancing vein in the cord at the C1 level without abnormal signal. Possible normal but prominent cord vascularity. Posterior Fossa, vertebral arteries, paraspinal tissues: Negative Disc levels: C2-3: Mild disc degeneration without stenosis C3-4: Mild disc degeneration. Central and left-sided spurring with moderate left foraminal stenosis due to spurring. C4-5: Disc degeneration. Central disc protrusion and associated spurring with cord flattening and mild spinal stenosis. Neural foramina patent bilaterally. C5-6: Small right paracentral disc protrusion. Diffuse uncinate spurring. Cord flattening with moderate spinal stenosis and mild foraminal narrowing bilaterally. C6-7: Central disc protrusion and spurring. Cord flattening with mild spinal stenosis. C7-T1: Negative MRI THORACIC SPINE FINDINGS Alignment:  Normal Vertebrae: Normal bone marrow. Negative for fracture or mass. Normal  enhancement. Cord:  Normal cord signal.  No cord compression or cord lesion. Paraspinal and other soft tissues:  Negative Disc levels: Multilevel mild thoracic disc degeneration. Mild disc bulging and disc space narrowing and endplate spurring throughout the thoracic spine from T2 through T12. No significant spinal or foraminal encroachment. No large disc protrusion. MRI LUMBAR SPINE FINDINGS Segmentation:  Normal Alignment:  Normal Vertebrae:  Negative for fracture or mass Conus medullaris: Extends to the L2-3 level and appears normal. Low lying conus medullaris unchanged from the recent MRI. Paraspinal and other soft tissues: Postop fluid posteriorly at L4-5, typical for recent surgery. No mass-effect on the thecal sac. Disc levels: L1-2: Disc degeneration with disc bulging and Schmorl's node. Negative for stenosis L2-3: Disc degeneration with diffuse disc bulging and endplate spurring. Mild spinal stenosis and mild subarticular stenosis bilaterally unchanged from the prior study. L3-4: Moderate facet hypertrophy. Mild disc degeneration. Small amount of subdural fluid surrounds the thecal sac contributing to moderate spinal stenosis which has progressed in the interval. L4-5: Extensive bilateral laminectomy without spinal stenosis. Mild subarticular stenosis bilaterally. Shallow left foraminal disc protrusion with mild left foraminal encroachment unchanged from the prior study. Small postoperative fluid collection posterior to the thecal sac without mass-effect on the thecal sac L5-S1: Mild facet degeneration without stenosis. IMPRESSION: Multilevel cervical spondylosis. Mild spinal stenosis C4-5 and moderate spinal stenosis C5-6. Mild spinal stenosis C6-7 Multilevel thoracic degenerative change without significant stenosis or cord compression Multilevel lumbar degenerative change. Mild spinal stenosis L2-3. Moderate spinal stenosis L3-4 has progressed due to small amount of subdural fluid surrounding the thecal  sac. Postop bilateral laminectomy L4-5 with wide decompression thecal sac. Typical postoperative fluid posterior to the thecal sac without significant hematoma or compression of the thecal sac. Left foraminal disc protrusion at L4-5 unchanged from the prior study with mild left foraminal narrowing. These results were called by telephone at the time of interpretation on 03/08/2018 at 10:26 am to [name] , who verbally acknowledged these results. Electronically Signed   By: Franchot Gallo M.D.   On: 03/08/2018 10:29   Mm 3d Screen Breast Bilateral  Result Date: 02/24/2018 CLINICAL DATA:  Screening. EXAM: DIGITAL SCREENING BILATERAL MAMMOGRAM WITH TOMO AND CAD COMPARISON:  Previous exam(s). ACR Breast Density Category b: There are scattered areas of fibroglandular density. FINDINGS: There are no findings suspicious for malignancy. Images were processed with CAD. IMPRESSION: No mammographic evidence of malignancy. A result letter of this screening mammogram will be mailed directly to the patient. RECOMMENDATION: Screening mammogram in one year. (Code:SM-B-01Y) BI-RADS CATEGORY  1: Negative. Electronically Signed   By: Abelardo Diesel M.D.   On: 02/24/2018 16:29    EKG: Orders placed or performed during the hospital encounter of 07/09/17  . EKG 12-Lead  . EKG 12-Lead  . ED EKG within 10 minutes  . ED EKG within 10 minutes  . EKG     Hospital Course: Patient was admitted to [facility] and taken to the OR and underwent the above state procedure.  Post-operatively she was initially able to dorsiflex and plantarflex the feet but as time progressed and she became more aware, she reported weakness and numbness bilateral LE from the knee distal and no sensation from the waist distal. Unclear presentation. Serial exams did not show significant improvements and there was concern for an epidural bleed or hematoma. She was taken back to the OR emergently the same day for wound exploration and conversion of  hemi laminotomies to a central decompression. No significant hematoma or any epidural bleed was noted but extensive epidural fibrosis was noted due to prior surgery  at that level several years ago.  Patient tolerated the procedure well and was later transferred to the recovery room and then to the orthopaedic floor for postoperative care.  She did still report RLE weakness post-operatively which was followed with serial exams by [name] which demonstrated continued improvement and resolution of her numbness.  They were given PO and IV analgesics for pain control following their surgery.  They were given post-operative steroids and 24 hours of postoperative antibiotics.   PT was consulted postop to assist with mobility and transfers.  The patient was allowed to be WBAT with therapy and was taught back precautions.  Due to ongoing RLE weakness she was fitted with a MAFO brace and neurology was consulted as the left leg resolved to normal and the right was still showing deficit but slowly improving. To evaluate for non lumbar pathology as the presentation was unclear  MRI's of the brain, Cspine, Tspine, Lspine were performed which did show a wide decompression of the thecal sac at L4-5 and no neural compressive lesions elsewhere.  There was no evidence of stroke.  She did also experience constipation post-op and eventually on POD 4 she did have a BM. Discharge planning was consulted to help with postop disposition and equipment needs and due to patient's slow progression and post-operative RLE weakness and overall weakness it was felt she would be a good candidate for CIR.  They were given discharge instructions and dressing directions.  They were instructed on when to follow up in the office with [name].    Diet: Regular diet Activity:WBAT Follow-up:in 10-14 days Disposition - [facility] Discharged Condition: Good   Discharge Instructions    Call MD / Call 911   Complete by:  As directed     If you experience chest pain or shortness of breath, CALL 911 and be transported to the hospital emergency room.  If you develope a fever above 101 F, pus (white drainage) or increased drainage or redness at the wound, or calf pain, call your surgeon's office.   Constipation Prevention   Complete by:  As directed    Drink plenty of fluids.  Prune juice may be helpful.  You may use a stool softener, such as Colace (over the counter) 100 mg twice a day.  Use MiraLax (over the counter) for constipation as needed.   Diet - low sodium heart healthy   Complete by:  As directed    Increase activity slowly as tolerated   Complete by:  As directed      Allergies as of 03/09/2018      Reactions   Cephalosporins Anaphylaxis   Penicillins Anaphylaxis, Hives   Has patient had a PCN reaction causing immediate rash, facial/tongue/throat swelling, SOB or lightheadedness with hypotension: Yes Has patient had a PCN reaction causing severe rash involving mucus membranes or skin necrosis: Yes Has patient had a PCN reaction that required hospitalization Yes Has patient had a PCN reaction occurring within the last 10 years: No If all of the above answers are "NO", then may proceed with Cephalosporin use.   Anesthetics, Amide Nausea And Vomiting   Does not know name of it. States they put it on record foot center.    Peach [prunus Persica] Hives   Latex Hives, Itching, Rash      Medication List    STOP taking these medications   diphenhydrAMINE 25 MG tablet Commonly known as:  BENADRYL   famotidine 20 MG tablet Commonly known as:  PEPCID   predniSONE 20 MG tablet Commonly known as:  DELTASONE     TAKE these medications   carvedilol 12.5 MG tablet Commonly known as:  COREG Take 12.5 mg by mouth 2 (two) times daily.   docusate sodium 100 MG capsule Commonly known as:  COLACE Take 1 capsule (100 mg total) by mouth 2 (two) times daily.   doxycycline 100 MG capsule Commonly known as:   VIBRAMYCIN Take 100 mg by mouth 2 (two) times daily.   EPINEPHrine 0.3 mg/0.3 mL Soaj injection Commonly known as:  EPI-PEN Inject 0.3 mg into the muscle daily as needed for anaphylaxis. What changed:  Another medication with the same name was removed. Continue taking this medication, and follow the directions you see here.   LORazepam 0.5 MG tablet Commonly known as:  ATIVAN Take 1 tablet (0.5 mg total) by mouth every 8 (eight) hours. What changed:    when to take this  reasons to take this   methocarbamol 500 MG tablet Commonly known as:  ROBAXIN Take 1 tablet (500 mg total) by mouth every 6 (six) hours as needed. For back pain (scheduled in the evening)   oxyCODONE-acetaminophen 5-325 MG tablet Commonly known as:  PERCOCET/ROXICET Take 1-2 tablets by mouth every 6 (six) hours as needed. What changed:    how much to take  reasons to take this   polyethylene glycol packet Commonly known as:  MIRALAX Take 17 g by mouth daily.   PREMARIN 0.625 MG tablet Generic drug:  estrogens (conjugated) Take 0.625 mg by mouth daily.            Durable Medical Equipment  (From admission, onward)        Start     Ordered   03/05/18 1040  For home use only DME 3 n 1  Once     03/05/18 1049   03/05/18 1040  For home use only DME Walker rolling  Once    Question:  Patient needs a walker to treat with the following condition  Answer:  Hx of decompressive lumbar laminectomy   03/05/18 1049     Follow-up Information    [name], MD Follow up in 2 week(s).   Specialty:  Orthopedic Surgery Contact information: [address] [place] [address] [phone]        [facility] Follow up.   Specialty:  [place] Why:  A representative from [place] will contact you to arrange start date and time for your therapy. Contact information: [place] [address] [phone]            Signed: Lacie Draft, PA-C Orthopaedic Surgery 03/09/2018, 5:22 PM

## 2018-03-09 NOTE — Progress Notes (Signed)
Subjective: 5 Days Post-Op Procedure(s) (LRB): EXPLORATION OF LUMBAR DECOMPRESSION WOUND (N/A) Patient reports pain as 3 on 0-10 scale.   Dizzy yesterday ok today  TEDS and PAS off.  Objective: Vital signs in last 24 hours: Temp:  [97.8 F (36.6 C)-98.3 F (36.8 C)] 97.8 F (36.6 C) (05/14 0339) Pulse Rate:  [75-93] 75 (05/14 0339) Resp:  [12-16] 16 (05/14 0339) BP: (111-134)/(65-73) 111/73 (05/14 0339) SpO2:  [99 %-100 %] 99 % (05/14 0339)  Intake/Output from previous day: 05/13 0701 - 05/14 0700 In: 360 [P.O.:360] Out: 1 [Urine:1] Intake/Output this shift: No intake/output data recorded.  No results for input(s): HGB in the last 72 hours. No results for input(s): WBC, RBC, HCT, PLT in the last 72 hours. No results for input(s): NA, K, CL, CO2, BUN, CREATININE, GLUCOSE, CALCIUM in the last 72 hours. No results for input(s): LABPT, INR in the last 72 hours.  Compartment soft  Now with increased sensation distal to knee. L4 better than L5 and S1. Now with some sensation plantar and dorsum of foot. Has 2-3/5 DF and PF right. No DVT    Assessment/Plan: \ 5 Days Post-Op Procedure(s) (LRB): EXPLORATION OF LUMBAR DECOMPRESSION WOUND (N/A) Discharge to SNF Rehab today Probable post steroid affect. Also vasovagal after enema. Ativan prn. Sheet to assist DF of foot. TEDS and PAS back on Discussed nerve root healing short and lon term Preop weak as well Still unclear as to initial postop presentation But is recovering.      Savon Cobbs C 03/09/2018, 7:31 AM

## 2018-03-09 NOTE — Progress Notes (Signed)
Physical Therapy Treatment Patient Details Name: [name] MRN: [id number] DOB: Dec 31, 1960 Today's Date: 03/09/2018    History of Present Illness Pt is a 57 y/o female s/p Revision of Microlumbar Decompression Bilateral Lumbar Four-Five (Bilateral) and Central decompression at L4-5 with removal of partial spinous process of 4 and 5.  Exploration of the wound, evacuation of hematoma. PMH including but not limited to HTN.    PT Comments    Pt performed gait training with increased difficulty progressing R LE.  After 40 ft of gait training patient with complaints of dizziness and required assistance to sit in office chair in hallway.  Pt with difficulty keeping eyes open but remains to respond to her name throughout.  Vitals obtained, see below (WNL).  Pt transferred to recliner with +2 mod assistance and brought back to her room to be assessed by RN.  Pt remains in need of post acute rehab based on presentation.  Plan for gait training next session and continue CIR.      Follow Up Recommendations  Home health PT;Supervision/Assistance - 24 hour     Equipment Recommendations  Rolling walker with 5" wheels;3in1 (PT)    Recommendations for Other Services       Precautions / Restrictions Precautions Precautions: Fall;Back Precaution Comments: No brace ordered. AFO for R foot drop. Required Braces or Orthoses: Other Brace/Splint Other Brace/Splint: R AFO Restrictions Weight Bearing Restrictions: No    Mobility  Bed Mobility Overal bed mobility: Needs Assistance Bed Mobility: Rolling;Sidelying to Sit Rolling: Supervision Sidelying to sit: Min guard       General bed mobility comments: Cues for technique, used LLE to advance RLE to edge of bed.  Pt able to come to sitting with min guard asistance.    Transfers Overall transfer level: Needs assistance Equipment used: Rolling walker (2 wheeled) Transfers: Sit to/from Stand Sit to Stand: Min assist(min +1 from bed, post  pre-sycopal episode, patient appears groggy and required +2 mod to stand and return to recliner chair.  )         General transfer comment: Min assistance from bed, patient reports in hall, " I am going to faint."  Pt starts to close eyes and RN pulled office chair over to patient.  Vitals assessed and WNL.  BP 139/74, HR 86bpm and O2 100% on RA.  Pt required increased assistance to stand from office chair and transfer to recliner chair.  RN informed of incident.    Ambulation/Gait Ambulation/Gait assistance: Mod assist;Min assist Ambulation Distance (Feet): 40 Feet Assistive device: Rolling walker (2 wheeled) Gait Pattern/deviations: Step-to pattern;Decreased step length - right;Decreased dorsiflexion - right;Trunk flexed Gait velocity: decreased Gait velocity interpretation: <1.8 ft/sec, indicate of risk for recurrent falls General Gait Details: Pt required cues and asistance for weight shifting L and increasing stride length on R.  At times patient dragging RLE.  Pt required chair after 40 ft due to complaints of dizziness and reporting, "I'm gonna faint."   Stairs             Wheelchair Mobility    Modified Rankin (Stroke Patients Only)       Balance Overall balance assessment: Needs assistance   Sitting balance-Leahy Scale: Fair       Standing balance-Leahy Scale: Poor Standing balance comment: Relies on BUE support.                             Cognition Arousal/Alertness: Awake/alert;Lethargic(Pt awake  and alert on arrival but with c/o dizziness patient became lethargic and barely able to keep eyes open.  ) Behavior During Therapy: [facility] for tasks assessed/performed Overall Cognitive Status: Within Functional Limits for tasks assessed                                        Exercises      General Comments        Pertinent Vitals/Pain Pain Assessment: Faces Faces Pain Scale: Hurts little more Pain Location: R side low back   Pain Descriptors / Indicators: Guarding;Sore Pain Intervention(s): Monitored during session;Repositioned    Home Living                      Prior Function            PT Goals (current goals can now be found in the care plan section) Acute Rehab PT Goals Patient Stated Goal: decrease pain Potential to Achieve Goals: Good Progress towards PT goals: Progressing toward goals    Frequency    Min 5X/week      PT Plan Current plan remains appropriate    Co-evaluation              AM-PAC PT "6 Clicks" Daily Activity  Outcome Measure  Difficulty turning over in bed (including adjusting bedclothes, sheets and blankets)?: Unable Difficulty moving from lying on back to sitting on the side of the bed? : Unable Difficulty sitting down on and standing up from a chair with arms (e.g., wheelchair, bedside commode, etc,.)?: Unable Help needed moving to and from a bed to chair (including a wheelchair)?: A Lot Help needed walking in hospital room?: A Lot Help needed climbing 3-5 steps with a railing? : A Lot 6 Click Score: 9    End of Session Equipment Utilized During Treatment: Gait belt Activity Tolerance: Treatment limited secondary to medical complications (Comment)(Pt with another pre syncopal event, appears groggy with no vitals to support appearance.  ) Patient left: in chair;with call bell/phone within reach;with family/visitor present;with nursing/sitter in room Nurse Communication: Mobility status;Other (comment)(informed nursing of episode.  ) PT Visit Diagnosis: Other abnormalities of gait and mobility (R26.89);Pain Pain - part of body: (back)     Time: 1355-1414 PT Time Calculation (min) (ACUTE ONLY): 19 min  Charges:  $Gait Training: 8-22 mins                    G Codes:       Governor Rooks, PTA pager [phone]    Cristela Blue 03/09/2018, 2:37 PM

## 2018-03-09 NOTE — Progress Notes (Signed)
Patient ID: [name], female   DOB: 01/03/1961, 57 y.o.   MRN: [id number]   Post op check 5/92019  Called by nurse to see patient who was moving feet when first brought to PACU now when fully awake unable to move both legs and no sensation In legs.  VSS  Exam:  Reports no sensation to light touch from waist down other than Perineal. No active motor of quads, dorsiflexion or plantarflexion. Pulses intact. No swelling. Sensation above waist proximal intact. UE 5/5 and intact sensation. 2+biceps and triceps. No Hoffman sign No neck pain or radicular pain.  Exam repeated q57min for three exams without change.  Discussed with Dr. Arnoldo Morale with bedside exam. Unclear neurologic presentation given surgery at L45 With proximal involvement to L1.  Discussed observation versus reexploration. Given no improvement felt reexploration most appropriate Possible expanding hematoma. Discussed with patient and Husband Risks and benefits. Plan return to OR for exploration and Central decompression.

## 2018-03-09 NOTE — Progress Notes (Signed)
Patient ID: [name], female   DOB: [birth year], 57 y.o.   MRN: [id number] Patient and family arrived from [id number] via hospital bed with 5N nursing staff. Patient and family oriented to room, rehab process, rehab schedule, fall prevention plan, rehab safety plan, rehab schedule, and health resource notebook with verbal understanding. Patient resting in bed with family at the bedside and call bell within reach.

## 2018-03-09 NOTE — H&P (Signed)
Physical Medicine and Rehabilitation Admission H&P    CC: Lumbar stenosis with post op weakness and functional decline   HPI: [name] is a 57 year old female with history of HTN, HA, prior back injury with microdiskectomy 02/2016 with ongoing back pain who started developing difficulty walking, recent falls, pain radiating to LLE X 4 weeks PTA.  Work-up done revealing severe recurrent lateral stenosis L4-L5.  She was admitted on 03/04/2018 for foraminotomies L4-5 with lysis of epidural fibrosis and revision of bilateral hemilaminotomies by [name].  While in PACU, patient reported loss of sensation from waist down with inability to move bilateral lower extremities.  She was taken back to the OR for exploration of wound with evacuation of hematoma and removal of partial spinous processes L4 and L5.  Sensory deficits of left lower extremity improving however she continues to have numbness in the right lower extremity from knees down. Pt experienced some dizziness this afternoon while working with therapy which apparently was self-limited, resolving once she got back in bed.   Dr. Cheral Marker was consulted for input and question symptoms due to nerve root traction and/or postop inflammation from adhesions or "cracked back" from flexion/hyperextension during surgery.  MRI of brain was negative for acute intracranial process.  MRI of cervical, thoracic and lumbar spine showed multilevel cervical spondylosis and mild to moderate stenosis cervical and lumbar spine.  Neurology recommends EMG nerve conduction study in 4 to 6 weeks past discharge to monitor for recovery.  Therapy is ongoing and patient continues to have deficits in ability to carry on ADL tasks as well as mobility.  CIR was recommended due to functional decline.   Review of Systems  Constitutional: Negative for chills and fever.  HENT: Negative for hearing loss and tinnitus.   Eyes: Negative for blurred vision and double  vision.  Respiratory: Negative for cough and hemoptysis.   Cardiovascular: Negative for chest pain and palpitations.  Gastrointestinal: Positive for constipation and nausea. Negative for heartburn and vomiting.  Genitourinary: Negative for dysuria.  Musculoskeletal: Positive for back pain (chronic for the past 2 years).  Neurological: Positive for dizziness (with activity), sensory change (left sided sensation improving. ) and focal weakness. Negative for headaches.  Psychiatric/Behavioral: Negative for depression and memory loss. The patient has insomnia. The patient is not nervous/anxious.       Past Medical History:  Diagnosis Date  . Anxiety   . Back pain    related to spinal stenosis and disc problem, radiates down left buttocks to leg., weakness occ.  [name] Headache   . Hypertension   . PONV (postoperative nausea and vomiting)   . Vaginal foreign object    "Uses Femring"         Past Surgical History:  Procedure Laterality Date  . ABDOMINAL HYSTERECTOMY    . CARDIAC CATHETERIZATION N/A 04/18/2015   Procedure: Left Heart Cath and Coronary Angiography;  Surgeon: [name], MD;  Location: [facility];  Service: Cardiovascular;  Laterality: N/A;  . FOOT SURGERY Bilateral    Felida "bunion,bone spur, tendon" (1) -6'16, (1)-10'16  . LUMBAR LAMINECTOMY/DECOMPRESSION MICRODISCECTOMY Bilateral 12/28/2015   Procedure: MICRO LUMBAR DECOMPRESSION L4 - L5 BILATERALLY;  Surgeon: [name], MD;  Location: [facility];  Service: Orthopedics;  Laterality: Bilateral;  . LUMBAR LAMINECTOMY/DECOMPRESSION MICRODISCECTOMY Bilateral 03/04/2018   Procedure: Revision of Microlumbar Decompression Bilateral Lumbar Four-Five;  Surgeon: [name], MD;  Location: [facility];  Service: Orthopedics;  Laterality: Bilateral;  90  mins  . WISDOM TOOTH EXTRACTION    . WOUND EXPLORATION N/A 03/04/2018   Procedure: EXPLORATION OF LUMBAR DECOMPRESSION WOUND;  Surgeon: [name], MD;  Location: [facility];  Service: Orthopedics;  Laterality: N/A;         Family History  Problem Relation Age of Onset  . Lung cancer Father   . Pancreatic cancer Sister   . Breast cancer Sister 58  . Throat cancer Brother   . Cancer Sister   . Multiple myeloma Sister   . Breast cancer Sister        diagnosed in her 2's  . Stomach cancer Cousin   . Colon cancer Neg Hx     Social History:  Married. Works in the lab at [employer]. She reports that she has never smoked. She has never used smokeless tobacco. She reports that she does not drink alcohol or use drugs.         Allergies  Allergen Reactions  . Cephalosporins Anaphylaxis  . Penicillins Anaphylaxis and Hives    Has patient had a PCN reaction causing immediate rash, facial/tongue/throat swelling, SOB or lightheadedness with hypotension: Yes Has patient had a PCN reaction causing severe rash involving mucus membranes or skin necrosis: Yes Has patient had a PCN reaction that required hospitalization Yes Has patient had a PCN reaction occurring within the last 10 years: No If all of the above answers are "NO", then may proceed with Cephalosporin use.   . Anesthetics, Amide Nausea And Vomiting    Does not know name of it. States they put it on record foot center.   [name] Peach [Prunus Persica] Hives  . Latex Hives, Itching and Rash          Medications Prior to Admission  Medication Sig Dispense Refill  . carvedilol (COREG) 12.5 MG tablet Take 12.5 mg by mouth 2 (two) times daily.  5  . LORazepam (ATIVAN) 0.5 MG tablet Take 1 tablet (0.5 mg total) by mouth every 8 (eight) hours. (Patient taking differently: Take 0.5 mg by mouth every 8 (eight) hours as needed for anxiety. ) 15 tablet 0  . PREMARIN 0.625 MG tablet Take 0.625 mg by mouth daily.  7  . [DISCONTINUED] methocarbamol (ROBAXIN) 500 MG tablet Take 500 mg by mouth every 6 (six) hours as needed. For back pain (scheduled in the evening)  1  .  [DISCONTINUED] oxyCODONE-acetaminophen (PERCOCET/ROXICET) 5-325 MG tablet Take 1 tablet by mouth every 6 (six) hours as needed for pain.  0  . diphenhydrAMINE (BENADRYL) 25 MG tablet Take 1 tablet (25 mg total) by mouth 3 (three) times daily. Take one tablet three times daily for two days (Patient not taking: Reported on 02/18/2018) 10 tablet 0  . doxycycline (VIBRAMYCIN) 100 MG capsule Take 100 mg by mouth 2 (two) times daily.  2  . EPINEPHrine 0.15 MG/0.15ML IJ injection Inject 0.15 mLs (0.15 mg total) into the muscle as needed for anaphylaxis. (Patient not taking: Reported on 02/18/2018) 2 Device 0  . EPINEPHrine 0.3 mg/0.3 mL IJ SOAJ injection Inject 0.3 mg into the muscle daily as needed for anaphylaxis.  0  . famotidine (PEPCID) 20 MG tablet Take 1 tablet (20 mg total) by mouth 2 (two) times daily. Take one tablet twice daily for two days (Patient not taking: Reported on 02/18/2018) 10 tablet 0  . predniSONE (DELTASONE) 20 MG tablet Take 2 tablets (40 mg total) by mouth daily with breakfast. For the next four days (Patient not taking: Reported on  02/18/2018) 8 tablet 0    Drug Regimen Review  Drug regimen was reviewed and remains appropriate with no significant issues identified  Home: Home Living Family/patient expects to be discharged to:: Private residence Living Arrangements: Spouse/significant other, Children Available Help at Discharge: Family, Available 24 hours/day Type of Home: House Home Access: Stairs to enter [employer] of Steps: 4 Entrance Stairs-Rails: Right, Left Home Layout: One level, Able to live on main level with bedroom/bathroom Bathroom Shower/Tub: Multimedia programmer: Standard Bathroom Accessibility: Yes Home Equipment: Civil engineer, contracting, [place] - single point Additional Comments: spouse pending knee replacement so limited in ability to help with phsycial (A)   Functional History: Prior Function Level of Independence:  Independent with assistive device(s) Comments: ambulated with [facility]  Functional Status:  Mobility: Bed Mobility Overal bed mobility: Needs Assistance Bed Mobility: Rolling, Sidelying to Sit Rolling: Supervision Sidelying to sit: Min guard Sit to supine: Mod assist General bed mobility comments: up in chair on arrival Transfers Overall transfer level: Needs assistance Equipment used: Rolling walker (2 wheeled) Transfers: Sit to/from Stand Sit to Stand: Min guard Stand pivot transfers: Mod assist General transfer comment: Min guard assist to power up to standing position for safety.  Ambulation/Gait Ambulation/Gait assistance: Min assist Ambulation Distance (Feet): 50 Feet Assistive device: Rolling walker (2 wheeled) Gait Pattern/deviations: Step-to pattern, Decreased step length - right, Decreased dorsiflexion - right, Trunk flexed General Gait Details: One standing rest break during gait secondary to increased dizziness. VC for posture and increased step length. After 50 ft pt required seated break due to dizziness and c/o room spinning. Returned to room and vitals taken. Gait velocity: decreased  ADL: ADL Overall ADL's : Needs assistance/impaired Eating/Feeding: Set up, Sitting Grooming: Independent Upper Body Bathing: Set up, Sitting Lower Body Bathing: Minimal assistance, With adaptive equipment, Sit to/from stand Upper Body Dressing : Set up, Sitting Lower Body Dressing: Minimal assistance, Moderate assistance, Sit to/from stand Lower Body Dressing Details (indicate cue type and reason): Min assist for underwear with reacher. Mod assist for shoes due to AFO. Toilet Transfer: Min guard, RW, BSC, Ambulation Toilet Transfer Details (indicate cue type and reason): short distance ambulation with cues for RLE movement.  Toileting- Water quality scientist and Hygiene: Min guard, Sit to/from stand Functional mobility during ADLs: Min guard, Rolling walker General ADL Comments: Per  RN and pt MD stated to "go half speed" with therapy. In progress note "up with therapy" is only mention of instructions to therapy. Also reporting that MD is "considering" back brace but pt still OK to chair for the time being. Session focused on LB dressing seated in recliner, applying MAFO, and simulated stnad-pivot toilet transfers. Educated pt concerning need to keep BLE in 90-90 positioning in chair to prevent pressure on her back.   Cognition: Cognition Overall Cognitive Status: Within Functional Limits for tasks assessed Orientation Level: Oriented X4 Cognition Arousal/Alertness: Awake/alert Behavior During Therapy: WFL for tasks assessed/performed Overall Cognitive Status: Within Functional Limits for tasks assessed   Blood pressure 111/73, pulse 75, temperature 97.8 F (36.6 C), temperature source Oral, resp. rate 16, height _0  (1.651 m), weight 65 kg (143 lb 4.8 oz), SpO2 99 %. Physical Exam  Nursing note and vitals reviewed. Constitutional: She is oriented to person, place, and time. She appears well-developed. No distress.  HENT:  Head: Normocephalic and atraumatic.  Eyes: Pupils are equal, round, and reactive to light. Conjunctivae are normal.  Neck: No tracheal deviation present. No thyromegaly present.  Cardiovascular:  Normal rate and regular rhythm. Exam reveals no friction rub.  No murmur heard. Respiratory: Effort normal. No respiratory distress. She has no wheezes. She has no rales.  GI: Soft. She exhibits no distension. There is no tenderness.  Musculoskeletal:  Sensory deficits RLE with extensor tone. Tight heel cords bilaterally with right > left foot drop  Neurological: She is alert and oriented to person, place, and time.  UE 4/5 prox to distal. LLE 2+ hf, 3/5 ke 4/5 adf/pf. RLE: 1 to 1+/5 hf, ke, adf/pf. Decreased LT right foot, leg and lateral leg. DTR's trace to absent in LE's.   Psychiatric:  Pleasant, slightly anxious    LabResultsLast48Hours    No results found for this or any previous visit (from the past 48 hour(s)).    ImagingResults(Last48hours)  Mr Jeri Cos OJ Contrast  Result Date: 03/08/2018 CLINICAL DATA:  Lumbar decompression L4-5 03/04/2018. Postop bilateral leg weakness, now improved. Rule out stroke. EXAM: MRI HEAD WITHOUT AND WITH CONTRAST TECHNIQUE: Multiplanar, multiecho pulse sequences of the brain and surrounding structures were obtained without and with intravenous contrast. CONTRAST:  33m MULTIHANCE GADOBENATE DIMEGLUMINE 529 MG/ML IV SOLN COMPARISON:  None. FINDINGS: Brain: Negative for acute infarct. Scattered small white matter hyperintensities bilaterally mostly in the deep white matter. Left frontal periventricular white matter hyperintensity. Brainstem and cerebellum normal. Negative for hemorrhage or mass. Normal enhancement postcontrast administration. Vascular: Normal arterial flow void Skull and upper cervical spine: Negative Sinuses/Orbits: Negative Other: None IMPRESSION: No acute abnormality. Mild chronic white matter changes likely due to chronic microvascular ischemia. Migraine headaches and demyelinating disease are other possibilities however the pattern is not typical for demyelinating disease. Electronically Signed   By: CFranchot GalloM.D.   On: 03/08/2018 09:57   Mr Cervical Spine W Wo Contrast  Result Date: 03/08/2018 CLINICAL DATA:  Lumbar laminectomy L4-5 03/04/2018. Postop bilateral leg weakness. Rule out hematoma or cord compression EXAM: MRI TOTAL SPINE WITHOUT AND WITH CONTRAST TECHNIQUE: Multisequence MR imaging of the spine from the cervical spine to the sacrum was performed prior to and following IV contrast administration for evaluation of spinal metastatic disease. CONTRAST:  159mMULTIHANCE GADOBENATE DIMEGLUMINE 529 MG/ML IV SOLN COMPARISON:  Lumbar MRI 02/01/2018 FINDINGS: MRI CERVICAL SPINE FINDINGS Alignment: Normal Vertebrae: Normal bone marrow.  Negative for fracture or mass  Cord: Spinal cord signal normal. Enhancing vein in the cord at the C1 level without abnormal signal. Possible normal but prominent cord vascularity. Posterior Fossa, vertebral arteries, paraspinal tissues: Negative Disc levels: C2-3: Mild disc degeneration without stenosis C3-4: Mild disc degeneration. Central and left-sided spurring with moderate left foraminal stenosis due to spurring. C4-5: Disc degeneration. Central disc protrusion and associated spurring with cord flattening and mild spinal stenosis. Neural foramina patent bilaterally. C5-6: Small right paracentral disc protrusion. Diffuse uncinate spurring. Cord flattening with moderate spinal stenosis and mild foraminal narrowing bilaterally. C6-7: Central disc protrusion and spurring. Cord flattening with mild spinal stenosis. C7-T1: Negative MRI THORACIC SPINE FINDINGS Alignment:  Normal Vertebrae: Normal bone marrow. Negative for fracture or mass. Normal enhancement. Cord:  Normal cord signal.  No cord compression or cord lesion. Paraspinal and other soft tissues: Negative Disc levels: Multilevel mild thoracic disc degeneration. Mild disc bulging and disc space narrowing and endplate spurring throughout the thoracic spine from T2 through T12. No significant spinal or foraminal encroachment. No large disc protrusion. MRI LUMBAR SPINE FINDINGS Segmentation:  Normal Alignment:  Normal Vertebrae:  Negative for fracture or mass Conus medullaris: Extends to the L2-3 level  and appears normal. Low lying conus medullaris unchanged from the recent MRI. Paraspinal and other soft tissues: Postop fluid posteriorly at L4-5, typical for recent surgery. No mass-effect on the thecal sac. Disc levels: L1-2: Disc degeneration with disc bulging and Schmorl's node. Negative for stenosis L2-3: Disc degeneration with diffuse disc bulging and endplate spurring. Mild spinal stenosis and mild subarticular stenosis bilaterally unchanged from the prior study. L3-4: Moderate facet  hypertrophy. Mild disc degeneration. Small amount of subdural fluid surrounds the thecal sac contributing to moderate spinal stenosis which has progressed in the interval. L4-5: Extensive bilateral laminectomy without spinal stenosis. Mild subarticular stenosis bilaterally. Shallow left foraminal disc protrusion with mild left foraminal encroachment unchanged from the prior study. Small postoperative fluid collection posterior to the thecal sac without mass-effect on the thecal sac L5-S1: Mild facet degeneration without stenosis. IMPRESSION: Multilevel cervical spondylosis. Mild spinal stenosis C4-5 and moderate spinal stenosis C5-6. Mild spinal stenosis C6-7 Multilevel thoracic degenerative change without significant stenosis or cord compression Multilevel lumbar degenerative change. Mild spinal stenosis L2-3. Moderate spinal stenosis L3-4 has progressed due to small amount of subdural fluid surrounding the thecal sac. Postop bilateral laminectomy L4-5 with wide decompression thecal sac. Typical postoperative fluid posterior to the thecal sac without significant hematoma or compression of the thecal sac. Left foraminal disc protrusion at L4-5 unchanged from the prior study with mild left foraminal narrowing. These results were called by telephone at the time of interpretation on 03/08/2018 at 10:26 am to [name] , who verbally acknowledged these results. Electronically Signed   By: Franchot Gallo M.D.   On: 03/08/2018 10:29   Mr Thoracic Spine W Wo Contrast  Result Date: 03/08/2018 CLINICAL DATA:  Lumbar laminectomy L4-5 03/04/2018. Postop bilateral leg weakness. Rule out hematoma or cord compression EXAM: MRI TOTAL SPINE WITHOUT AND WITH CONTRAST TECHNIQUE: Multisequence MR imaging of the spine from the cervical spine to the sacrum was performed prior to and following IV contrast administration for evaluation of spinal metastatic disease. CONTRAST:  28m MULTIHANCE GADOBENATE DIMEGLUMINE 529 MG/ML IV SOLN  COMPARISON:  Lumbar MRI 02/01/2018 FINDINGS: MRI CERVICAL SPINE FINDINGS Alignment: Normal Vertebrae: Normal bone marrow.  Negative for fracture or mass Cord: Spinal cord signal normal. Enhancing vein in the cord at the C1 level without abnormal signal. Possible normal but prominent cord vascularity. Posterior Fossa, vertebral arteries, paraspinal tissues: Negative Disc levels: C2-3: Mild disc degeneration without stenosis C3-4: Mild disc degeneration. Central and left-sided spurring with moderate left foraminal stenosis due to spurring. C4-5: Disc degeneration. Central disc protrusion and associated spurring with cord flattening and mild spinal stenosis. Neural foramina patent bilaterally. C5-6: Small right paracentral disc protrusion. Diffuse uncinate spurring. Cord flattening with moderate spinal stenosis and mild foraminal narrowing bilaterally. C6-7: Central disc protrusion and spurring. Cord flattening with mild spinal stenosis. C7-T1: Negative MRI THORACIC SPINE FINDINGS Alignment:  Normal Vertebrae: Normal bone marrow. Negative for fracture or mass. Normal enhancement. Cord:  Normal cord signal.  No cord compression or cord lesion. Paraspinal and other soft tissues: Negative Disc levels: Multilevel mild thoracic disc degeneration. Mild disc bulging and disc space narrowing and endplate spurring throughout the thoracic spine from T2 through T12. No significant spinal or foraminal encroachment. No large disc protrusion. MRI LUMBAR SPINE FINDINGS Segmentation:  Normal Alignment:  Normal Vertebrae:  Negative for fracture or mass Conus medullaris: Extends to the L2-3 level and appears normal. Low lying conus medullaris unchanged from the recent MRI. Paraspinal and other soft tissues: Postop fluid posteriorly at  L4-5, typical for recent surgery. No mass-effect on the thecal sac. Disc levels: L1-2: Disc degeneration with disc bulging and Schmorl's node. Negative for stenosis L2-3: Disc degeneration with diffuse  disc bulging and endplate spurring. Mild spinal stenosis and mild subarticular stenosis bilaterally unchanged from the prior study. L3-4: Moderate facet hypertrophy. Mild disc degeneration. Small amount of subdural fluid surrounds the thecal sac contributing to moderate spinal stenosis which has progressed in the interval. L4-5: Extensive bilateral laminectomy without spinal stenosis. Mild subarticular stenosis bilaterally. Shallow left foraminal disc protrusion with mild left foraminal encroachment unchanged from the prior study. Small postoperative fluid collection posterior to the thecal sac without mass-effect on the thecal sac L5-S1: Mild facet degeneration without stenosis. IMPRESSION: Multilevel cervical spondylosis. Mild spinal stenosis C4-5 and moderate spinal stenosis C5-6. Mild spinal stenosis C6-7 Multilevel thoracic degenerative change without significant stenosis or cord compression Multilevel lumbar degenerative change. Mild spinal stenosis L2-3. Moderate spinal stenosis L3-4 has progressed due to small amount of subdural fluid surrounding the thecal sac. Postop bilateral laminectomy L4-5 with wide decompression thecal sac. Typical postoperative fluid posterior to the thecal sac without significant hematoma or compression of the thecal sac. Left foraminal disc protrusion at L4-5 unchanged from the prior study with mild left foraminal narrowing. These results were called by telephone at the time of interpretation on 03/08/2018 at 10:26 am to [name] , who verbally acknowledged these results. Electronically Signed   By: Franchot Gallo M.D.   On: 03/08/2018 10:29   Mr Lumbar Spine W Wo Contrast  Result Date: 03/08/2018 CLINICAL DATA:  Lumbar laminectomy L4-5 03/04/2018. Postop bilateral leg weakness. Rule out hematoma or cord compression EXAM: MRI TOTAL SPINE WITHOUT AND WITH CONTRAST TECHNIQUE: Multisequence MR imaging of the spine from the cervical spine to the sacrum was performed prior to and  following IV contrast administration for evaluation of spinal metastatic disease. CONTRAST:  93m MULTIHANCE GADOBENATE DIMEGLUMINE 529 MG/ML IV SOLN COMPARISON:  Lumbar MRI 02/01/2018 FINDINGS: MRI CERVICAL SPINE FINDINGS Alignment: Normal Vertebrae: Normal bone marrow.  Negative for fracture or mass Cord: Spinal cord signal normal. Enhancing vein in the cord at the C1 level without abnormal signal. Possible normal but prominent cord vascularity. Posterior Fossa, vertebral arteries, paraspinal tissues: Negative Disc levels: C2-3: Mild disc degeneration without stenosis C3-4: Mild disc degeneration. Central and left-sided spurring with moderate left foraminal stenosis due to spurring. C4-5: Disc degeneration. Central disc protrusion and associated spurring with cord flattening and mild spinal stenosis. Neural foramina patent bilaterally. C5-6: Small right paracentral disc protrusion. Diffuse uncinate spurring. Cord flattening with moderate spinal stenosis and mild foraminal narrowing bilaterally. C6-7: Central disc protrusion and spurring. Cord flattening with mild spinal stenosis. C7-T1: Negative MRI THORACIC SPINE FINDINGS Alignment:  Normal Vertebrae: Normal bone marrow. Negative for fracture or mass. Normal enhancement. Cord:  Normal cord signal.  No cord compression or cord lesion. Paraspinal and other soft tissues: Negative Disc levels: Multilevel mild thoracic disc degeneration. Mild disc bulging and disc space narrowing and endplate spurring throughout the thoracic spine from T2 through T12. No significant spinal or foraminal encroachment. No large disc protrusion. MRI LUMBAR SPINE FINDINGS Segmentation:  Normal Alignment:  Normal Vertebrae:  Negative for fracture or mass Conus medullaris: Extends to the L2-3 level and appears normal. Low lying conus medullaris unchanged from the recent MRI. Paraspinal and other soft tissues: Postop fluid posteriorly at L4-5, typical for recent surgery. No mass-effect on the  thecal sac. Disc levels: L1-2: Disc degeneration with disc bulging and  Schmorl's node. Negative for stenosis L2-3: Disc degeneration with diffuse disc bulging and endplate spurring. Mild spinal stenosis and mild subarticular stenosis bilaterally unchanged from the prior study. L3-4: Moderate facet hypertrophy. Mild disc degeneration. Small amount of subdural fluid surrounds the thecal sac contributing to moderate spinal stenosis which has progressed in the interval. L4-5: Extensive bilateral laminectomy without spinal stenosis. Mild subarticular stenosis bilaterally. Shallow left foraminal disc protrusion with mild left foraminal encroachment unchanged from the prior study. Small postoperative fluid collection posterior to the thecal sac without mass-effect on the thecal sac L5-S1: Mild facet degeneration without stenosis. IMPRESSION: Multilevel cervical spondylosis. Mild spinal stenosis C4-5 and moderate spinal stenosis C5-6. Mild spinal stenosis C6-7 Multilevel thoracic degenerative change without significant stenosis or cord compression Multilevel lumbar degenerative change. Mild spinal stenosis L2-3. Moderate spinal stenosis L3-4 has progressed due to small amount of subdural fluid surrounding the thecal sac. Postop bilateral laminectomy L4-5 with wide decompression thecal sac. Typical postoperative fluid posterior to the thecal sac without significant hematoma or compression of the thecal sac. Left foraminal disc protrusion at L4-5 unchanged from the prior study with mild left foraminal narrowing. These results were called by telephone at the time of interpretation on 03/08/2018 at 10:26 am to [name] , who verbally acknowledged these results. Electronically Signed   By: Franchot Gallo M.D.   On: 03/08/2018 10:29        Medical Problem List and Plan: 1.  Functional and mobility deficits secondary to lumbar stenosis with multiple-level radiculopathies s/p  Decompression and re-do.               -admit to inpatient rehab 2.  DVT Prophylaxis/Anticoagulation: Pharmaceutical: Lovenox 3. Pain Management: Oxycodone and robaxin prn effective. On gabapentin tid to help manage neuropathic pain.  4. Mood: LCSW to follow for evaluation and support.  5. Neuropsych: This patient is capable of making decisions on her own behalf. 6. Skin/Wound Care: Monitor wound for healing.  7. Fluids/Electrolytes/Nutrition: Monitor I/O. Check lytes in am.  8. HTN: Monitor BP bid--continue to coreg bid. Monitor for orthostatic changes.  9. ABLA: Post op blood loss--monitor H/H for now. Recheck later this week. Hgb just re-checked this afternoon and stable. hgb 10.0  10. Leucocytosis: ?steroid effect. WBC's down to 7.7 today 11. Constipation:Improved--continue Miralax.  12. Anxiety disorder:  Continue ativan prn for now. I suspect some of dizziness this afternoon was anxiety related.   Post Admission Physician Evaluation: 1. Functional deficits secondary  to lumbar stenosis with multi-level radiculopathies. 2. Patient is admitted to receive collaborative, interdisciplinary care between the physiatrist, rehab nursing staff, and therapy team. 3. Patient's level of medical complexity and substantial therapy needs in context of that medical necessity cannot be provided at a lesser intensity of care such as a SNF. 4. Patient has experienced substantial functional loss from his/her baseline which was documented above under the "Functional History" and "Functional Status" headings.  Judging by the patient's diagnosis, physical exam, and functional history, the patient has potential for functional progress which will result in measurable gains while on inpatient rehab.  These gains will be of substantial and practical use upon discharge  in facilitating mobility and self-care at the household level. 5. Physiatrist will provide 24 hour management of medical needs as well as oversight of the therapy plan/treatment and provide  guidance as appropriate regarding the interaction of the two. 6. The Preadmission Screening has been reviewed and patient status is unchanged unless otherwise stated above. 7. 24 hour rehab  nursing will assist with bladder management, bowel management, safety, skin/wound care, disease management, medication administration, pain management and patient education  and help integrate therapy concepts, techniques,education, etc. 8. PT will assess and treat for/with: Lower extremity strength, range of motion, stamina, balance, functional mobility, safety, adaptive techniques and equipment, NMR, orthotics, pain control, family ed.   Goals are: supervision to mod I. 9. OT will assess and treat for/with: ADL's, functional mobility, safety, upper extremity strength, adaptive techniques and equipment, NMR, pain control, family ed.   Goals are: supervision to mod I. Therapy may proceed with showering this patient. 10. SLP will assess and treat for/with: n/a.  Goals are: n/a. 11. Case Management and Social Worker will assess and treat for psychological issues and discharge planning. 12. Team conference will be held weekly to assess progress toward goals and to determine barriers to discharge. 13. Patient will receive at least 3 hours of therapy per day at least 5 days per week. 14. ELOS: 13-18 days       15. Prognosis:  excellent   I have personally performed a face to face diagnostic evaluation of this patient and formulated the key components of the plan.  Additionally, I have personally reviewed laboratory data, imaging studies, as well as relevant notes and concur with the physician assistant's documentation above.  Meredith Staggers, MD, [facility]   Bary Leriche, PA-C 03/09/2018

## 2018-03-09 NOTE — Progress Notes (Signed)
Inpatient Rehabilitation  Met with patient at bedside, provided insurance verification letter, and answered questions.  Continue to await insurance approval for a hopeful IP Rehab admission later today.  Will follow up with the team when I have a decision.  Call if questions.   Carmelia Roller., CCC/SLP Admission Coordinator  [place]  Cell [phone]

## 2018-03-09 NOTE — H&P (Signed)
Physical Medicine and Rehabilitation Admission H&P    CC: Lumbar stenosis with post op weakness and functional decline   HPI: [name] is a 57 year old female with history of HTN, HA, prior back injury with microdiskectomy 02/2016 with ongoing back pain who started developing difficulty walking, recent falls, pain radiating to LLE X 4 weeks PTA.  Work-up done revealing severe recurrent lateral stenosis L4-L5.  She was admitted on 03/04/2018 for foraminotomies L4-5 with lysis of epidural fibrosis and revision of bilateral hemilaminotomies by [name].  While in PACU, patient reported loss of sensation from waist down with inability to move bilateral lower extremities.  She was taken back to the OR for exploration of wound with evacuation of hematoma and removal of partial spinous processes L4 and L5.  Sensory deficits of left lower extremity improving however she continues to have numbness in the right lower extremity from knees down. Pt experienced some dizziness this afternoon while working with therapy which apparently was self-limited, resolving once she got back in bed.   Dr. Cheral Marker was consulted for input and question symptoms due to nerve root traction and/or postop inflammation from adhesions or "cracked back" from flexion/hyperextension during surgery.  MRI of brain was negative for acute intracranial process.  MRI of cervical, thoracic and lumbar spine showed multilevel cervical spondylosis and mild to moderate stenosis cervical and lumbar spine.  Neurology recommends EMG nerve conduction study in 4 to 6 weeks past discharge to monitor for recovery.  Therapy is ongoing and patient continues to have deficits in ability to carry on ADL tasks as well as mobility.  CIR was recommended due to functional decline.   Review of Systems  Constitutional: Negative for chills and fever.  HENT: Negative for hearing loss and tinnitus.   Eyes: Negative for blurred vision and double vision.    Respiratory: Negative for cough and hemoptysis.   Cardiovascular: Negative for chest pain and palpitations.  Gastrointestinal: Positive for constipation and nausea. Negative for heartburn and vomiting.  Genitourinary: Negative for dysuria.  Musculoskeletal: Positive for back pain (chronic for the past 2 years).  Neurological: Positive for dizziness (with activity), sensory change (left sided sensation improving. ) and focal weakness. Negative for headaches.  Psychiatric/Behavioral: Negative for depression and memory loss. The patient has insomnia. The patient is not nervous/anxious.       Past Medical History:  Diagnosis Date  . Anxiety   . Back pain    related to spinal stenosis and disc problem, radiates down left buttocks to leg., weakness occ.  [name] Headache   . Hypertension   . PONV (postoperative nausea and vomiting)   . Vaginal foreign object    "Uses Femring"    Past Surgical History:  Procedure Laterality Date  . ABDOMINAL HYSTERECTOMY    . CARDIAC CATHETERIZATION N/A 04/18/2015   Procedure: Left Heart Cath and Coronary Angiography;  Surgeon: [name], MD;  Location: [facility];  Service: Cardiovascular;  Laterality: N/A;  . FOOT SURGERY Bilateral    [place] "bunion,bone spur, tendon" (1) -6'16, (1)-10'16  . LUMBAR LAMINECTOMY/DECOMPRESSION MICRODISCECTOMY Bilateral 12/28/2015   Procedure: MICRO LUMBAR DECOMPRESSION L4 - L5 BILATERALLY;  Surgeon: [name], MD;  Location: [facility];  Service: Orthopedics;  Laterality: Bilateral;  . LUMBAR LAMINECTOMY/DECOMPRESSION MICRODISCECTOMY Bilateral 03/04/2018   Procedure: Revision of Microlumbar Decompression Bilateral Lumbar Four-Five;  Surgeon: [name], MD;  Location: [place];  Service: Orthopedics;  Laterality: Bilateral;  90 mins  . WISDOM  TOOTH EXTRACTION    . WOUND EXPLORATION N/A 03/04/2018   Procedure: EXPLORATION OF LUMBAR DECOMPRESSION WOUND;  Surgeon: [name], MD;  Location: [facility];  Service:  Orthopedics;  Laterality: N/A;    Family History  Problem Relation Age of Onset  . Lung cancer Father   . Pancreatic cancer Sister   . Breast cancer Sister 59  . Throat cancer Brother   . Cancer Sister   . Multiple myeloma Sister   . Breast cancer Sister        diagnosed in her 68's  . Stomach cancer Cousin   . Colon cancer Neg Hx     Social History:  Married. Works in the lab at [employer]. She reports that she has never smoked. She has never used smokeless tobacco. She reports that she does not drink alcohol or use drugs.    Allergies  Allergen Reactions  . Cephalosporins Anaphylaxis  . Penicillins Anaphylaxis and Hives    Has patient had a PCN reaction causing immediate rash, facial/tongue/throat swelling, SOB or lightheadedness with hypotension: Yes Has patient had a PCN reaction causing severe rash involving mucus membranes or skin necrosis: Yes Has patient had a PCN reaction that required hospitalization Yes Has patient had a PCN reaction occurring within the last 10 years: No If all of the above answers are "NO", then may proceed with Cephalosporin use.   . Anesthetics, Amide Nausea And Vomiting    Does not know name of it. States they put it on record foot center.   [name] Peach [Prunus Persica] Hives  . Latex Hives, Itching and Rash    Medications Prior to Admission  Medication Sig Dispense Refill  . carvedilol (COREG) 12.5 MG tablet Take 12.5 mg by mouth 2 (two) times daily.  5  . LORazepam (ATIVAN) 0.5 MG tablet Take 1 tablet (0.5 mg total) by mouth every 8 (eight) hours. (Patient taking differently: Take 0.5 mg by mouth every 8 (eight) hours as needed for anxiety. ) 15 tablet 0  . PREMARIN 0.625 MG tablet Take 0.625 mg by mouth daily.  7  . [DISCONTINUED] methocarbamol (ROBAXIN) 500 MG tablet Take 500 mg by mouth every 6 (six) hours as needed. For back pain (scheduled in the evening)  1  . [DISCONTINUED] oxyCODONE-acetaminophen (PERCOCET/ROXICET) 5-325 MG tablet Take 1  tablet by mouth every 6 (six) hours as needed for pain.  0  . diphenhydrAMINE (BENADRYL) 25 MG tablet Take 1 tablet (25 mg total) by mouth 3 (three) times daily. Take one tablet three times daily for two days (Patient not taking: Reported on 02/18/2018) 10 tablet 0  . doxycycline (VIBRAMYCIN) 100 MG capsule Take 100 mg by mouth 2 (two) times daily.  2  . EPINEPHrine 0.15 MG/0.15ML IJ injection Inject 0.15 mLs (0.15 mg total) into the muscle as needed for anaphylaxis. (Patient not taking: Reported on 02/18/2018) 2 Device 0  . EPINEPHrine 0.3 mg/0.3 mL IJ SOAJ injection Inject 0.3 mg into the muscle daily as needed for anaphylaxis.  0  . famotidine (PEPCID) 20 MG tablet Take 1 tablet (20 mg total) by mouth 2 (two) times daily. Take one tablet twice daily for two days (Patient not taking: Reported on 02/18/2018) 10 tablet 0  . predniSONE (DELTASONE) 20 MG tablet Take 2 tablets (40 mg total) by mouth daily with breakfast. For the next four days (Patient not taking: Reported on 02/18/2018) 8 tablet 0    Drug Regimen Review  Drug regimen was reviewed and remains appropriate with no  significant issues identified  Home: Home Living Family/patient expects to be discharged to:: Private residence Living Arrangements: Spouse/significant other, Children Available Help at Discharge: Family, Available 24 hours/day Type of Home: House Home Access: Stairs to enter [employer] of Steps: 4 Entrance Stairs-Rails: Right, Left Home Layout: One level, Able to live on main level with bedroom/bathroom Bathroom Shower/Tub: Multimedia programmer: Standard Bathroom Accessibility: Yes Home Equipment: Civil engineer, contracting, [place] - single point Additional Comments: spouse pending knee replacement so limited in ability to help with phsycial (A)   Functional History: Prior Function Level of Independence: Independent with assistive device(s) Comments: ambulated with [facility]  Functional Status:    Mobility: Bed Mobility Overal bed mobility: Needs Assistance Bed Mobility: Rolling, Sidelying to Sit Rolling: Supervision Sidelying to sit: Min guard Sit to supine: Mod assist General bed mobility comments: up in chair on arrival Transfers Overall transfer level: Needs assistance Equipment used: Rolling walker (2 wheeled) Transfers: Sit to/from Stand Sit to Stand: Min guard Stand pivot transfers: Mod assist General transfer comment: Min guard assist to power up to standing position for safety.  Ambulation/Gait Ambulation/Gait assistance: Min assist Ambulation Distance (Feet): 50 Feet Assistive device: Rolling walker (2 wheeled) Gait Pattern/deviations: Step-to pattern, Decreased step length - right, Decreased dorsiflexion - right, Trunk flexed General Gait Details: One standing rest break during gait secondary to increased dizziness. VC for posture and increased step length. After 50 ft pt required seated break due to dizziness and c/o room spinning. Returned to room and vitals taken. Gait velocity: decreased    ADL: ADL Overall ADL's : Needs assistance/impaired Eating/Feeding: Set up, Sitting Grooming: Independent Upper Body Bathing: Set up, Sitting Lower Body Bathing: Minimal assistance, With adaptive equipment, Sit to/from stand Upper Body Dressing : Set up, Sitting Lower Body Dressing: Minimal assistance, Moderate assistance, Sit to/from stand Lower Body Dressing Details (indicate cue type and reason): Min assist for underwear with reacher. Mod assist for shoes due to AFO. Toilet Transfer: Min guard, RW, BSC, Ambulation Toilet Transfer Details (indicate cue type and reason): short distance ambulation with cues for RLE movement.  Toileting- Water quality scientist and Hygiene: Min guard, Sit to/from stand Functional mobility during ADLs: Min guard, Rolling walker General ADL Comments: Per RN and pt MD stated to "go half speed" with therapy. In progress note "up with therapy"  is only mention of instructions to therapy. Also reporting that MD is "considering" back brace but pt still OK to chair for the time being. Session focused on LB dressing seated in recliner, applying MAFO, and simulated stnad-pivot toilet transfers. Educated pt concerning need to keep BLE in 90-90 positioning in chair to prevent pressure on her back.   Cognition: Cognition Overall Cognitive Status: Within Functional Limits for tasks assessed Orientation Level: Oriented X4 Cognition Arousal/Alertness: Awake/alert Behavior During Therapy: WFL for tasks assessed/performed Overall Cognitive Status: Within Functional Limits for tasks assessed   Blood pressure 111/73, pulse 75, temperature 97.8 F (36.6 C), temperature source Oral, resp. rate 16, height '5\' 5"'$  (1.651 m), weight 65 kg (143 lb 4.8 oz), SpO2 99 %. Physical Exam  Nursing note and vitals reviewed. Constitutional: She is oriented to person, place, and time. She appears well-developed. No distress.  HENT:  Head: Normocephalic and atraumatic.  Eyes: Pupils are equal, round, and reactive to light. Conjunctivae are normal.  Neck: No tracheal deviation present. No thyromegaly present.  Cardiovascular: Normal rate and regular rhythm. Exam reveals no friction rub.  No murmur heard. Respiratory: Effort normal.  No respiratory distress. She has no wheezes. She has no rales.  GI: Soft. She exhibits no distension. There is no tenderness.  Musculoskeletal:  Sensory deficits RLE with extensor tone. Tight heel cords bilaterally with right > left foot drop  Neurological: She is alert and oriented to person, place, and time.  UE 4/5 prox to distal. LLE 2+ hf, 3/5 ke 4/5 adf/pf. RLE: 1 to 1+/5 hf, ke, adf/pf. Decreased LT right foot, leg and lateral leg. DTR's trace to absent in LE's.   Psychiatric:  Pleasant, slightly anxious    No results found for this or any previous visit (from the past 48 hour(s)). Mr Jeri Cos Wo Contrast  Result Date:  03/08/2018 CLINICAL DATA:  Lumbar decompression L4-5 03/04/2018. Postop bilateral leg weakness, now improved. Rule out stroke. EXAM: MRI HEAD WITHOUT AND WITH CONTRAST TECHNIQUE: Multiplanar, multiecho pulse sequences of the brain and surrounding structures were obtained without and with intravenous contrast. CONTRAST:  40m MULTIHANCE GADOBENATE DIMEGLUMINE 529 MG/ML IV SOLN COMPARISON:  None. FINDINGS: Brain: Negative for acute infarct. Scattered small white matter hyperintensities bilaterally mostly in the deep white matter. Left frontal periventricular white matter hyperintensity. Brainstem and cerebellum normal. Negative for hemorrhage or mass. Normal enhancement postcontrast administration. Vascular: Normal arterial flow void Skull and upper cervical spine: Negative Sinuses/Orbits: Negative Other: None IMPRESSION: No acute abnormality. Mild chronic white matter changes likely due to chronic microvascular ischemia. Migraine headaches and demyelinating disease are other possibilities however the pattern is not typical for demyelinating disease. Electronically Signed   By: CFranchot GalloM.D.   On: 03/08/2018 09:57   Mr Cervical Spine W Wo Contrast  Result Date: 03/08/2018 CLINICAL DATA:  Lumbar laminectomy L4-5 03/04/2018. Postop bilateral leg weakness. Rule out hematoma or cord compression EXAM: MRI TOTAL SPINE WITHOUT AND WITH CONTRAST TECHNIQUE: Multisequence MR imaging of the spine from the cervical spine to the sacrum was performed prior to and following IV contrast administration for evaluation of spinal metastatic disease. CONTRAST:  140mMULTIHANCE GADOBENATE DIMEGLUMINE 529 MG/ML IV SOLN COMPARISON:  Lumbar MRI 02/01/2018 FINDINGS: MRI CERVICAL SPINE FINDINGS Alignment: Normal Vertebrae: Normal bone marrow.  Negative for fracture or mass Cord: Spinal cord signal normal. Enhancing vein in the cord at the C1 level without abnormal signal. Possible normal but prominent cord vascularity. Posterior  Fossa, vertebral arteries, paraspinal tissues: Negative Disc levels: C2-3: Mild disc degeneration without stenosis C3-4: Mild disc degeneration. Central and left-sided spurring with moderate left foraminal stenosis due to spurring. C4-5: Disc degeneration. Central disc protrusion and associated spurring with cord flattening and mild spinal stenosis. Neural foramina patent bilaterally. C5-6: Small right paracentral disc protrusion. Diffuse uncinate spurring. Cord flattening with moderate spinal stenosis and mild foraminal narrowing bilaterally. C6-7: Central disc protrusion and spurring. Cord flattening with mild spinal stenosis. C7-T1: Negative MRI THORACIC SPINE FINDINGS Alignment:  Normal Vertebrae: Normal bone marrow. Negative for fracture or mass. Normal enhancement. Cord:  Normal cord signal.  No cord compression or cord lesion. Paraspinal and other soft tissues: Negative Disc levels: Multilevel mild thoracic disc degeneration. Mild disc bulging and disc space narrowing and endplate spurring throughout the thoracic spine from T2 through T12. No significant spinal or foraminal encroachment. No large disc protrusion. MRI LUMBAR SPINE FINDINGS Segmentation:  Normal Alignment:  Normal Vertebrae:  Negative for fracture or mass Conus medullaris: Extends to the L2-3 level and appears normal. Low lying conus medullaris unchanged from the recent MRI. Paraspinal and other soft tissues: Postop fluid posteriorly at L4-5, typical for recent  surgery. No mass-effect on the thecal sac. Disc levels: L1-2: Disc degeneration with disc bulging and Schmorl's node. Negative for stenosis L2-3: Disc degeneration with diffuse disc bulging and endplate spurring. Mild spinal stenosis and mild subarticular stenosis bilaterally unchanged from the prior study. L3-4: Moderate facet hypertrophy. Mild disc degeneration. Small amount of subdural fluid surrounds the thecal sac contributing to moderate spinal stenosis which has progressed in the  interval. L4-5: Extensive bilateral laminectomy without spinal stenosis. Mild subarticular stenosis bilaterally. Shallow left foraminal disc protrusion with mild left foraminal encroachment unchanged from the prior study. Small postoperative fluid collection posterior to the thecal sac without mass-effect on the thecal sac L5-S1: Mild facet degeneration without stenosis. IMPRESSION: Multilevel cervical spondylosis. Mild spinal stenosis C4-5 and moderate spinal stenosis C5-6. Mild spinal stenosis C6-7 Multilevel thoracic degenerative change without significant stenosis or cord compression Multilevel lumbar degenerative change. Mild spinal stenosis L2-3. Moderate spinal stenosis L3-4 has progressed due to small amount of subdural fluid surrounding the thecal sac. Postop bilateral laminectomy L4-5 with wide decompression thecal sac. Typical postoperative fluid posterior to the thecal sac without significant hematoma or compression of the thecal sac. Left foraminal disc protrusion at L4-5 unchanged from the prior study with mild left foraminal narrowing. These results were called by telephone at the time of interpretation on 03/08/2018 at 10:26 am to [name] , who verbally acknowledged these results. Electronically Signed   By: Franchot Gallo M.D.   On: 03/08/2018 10:29   Mr Thoracic Spine W Wo Contrast  Result Date: 03/08/2018 CLINICAL DATA:  Lumbar laminectomy L4-5 03/04/2018. Postop bilateral leg weakness. Rule out hematoma or cord compression EXAM: MRI TOTAL SPINE WITHOUT AND WITH CONTRAST TECHNIQUE: Multisequence MR imaging of the spine from the cervical spine to the sacrum was performed prior to and following IV contrast administration for evaluation of spinal metastatic disease. CONTRAST:  81m MULTIHANCE GADOBENATE DIMEGLUMINE 529 MG/ML IV SOLN COMPARISON:  Lumbar MRI 02/01/2018 FINDINGS: MRI CERVICAL SPINE FINDINGS Alignment: Normal Vertebrae: Normal bone marrow.  Negative for fracture or mass Cord: Spinal  cord signal normal. Enhancing vein in the cord at the C1 level without abnormal signal. Possible normal but prominent cord vascularity. Posterior Fossa, vertebral arteries, paraspinal tissues: Negative Disc levels: C2-3: Mild disc degeneration without stenosis C3-4: Mild disc degeneration. Central and left-sided spurring with moderate left foraminal stenosis due to spurring. C4-5: Disc degeneration. Central disc protrusion and associated spurring with cord flattening and mild spinal stenosis. Neural foramina patent bilaterally. C5-6: Small right paracentral disc protrusion. Diffuse uncinate spurring. Cord flattening with moderate spinal stenosis and mild foraminal narrowing bilaterally. C6-7: Central disc protrusion and spurring. Cord flattening with mild spinal stenosis. C7-T1: Negative MRI THORACIC SPINE FINDINGS Alignment:  Normal Vertebrae: Normal bone marrow. Negative for fracture or mass. Normal enhancement. Cord:  Normal cord signal.  No cord compression or cord lesion. Paraspinal and other soft tissues: Negative Disc levels: Multilevel mild thoracic disc degeneration. Mild disc bulging and disc space narrowing and endplate spurring throughout the thoracic spine from T2 through T12. No significant spinal or foraminal encroachment. No large disc protrusion. MRI LUMBAR SPINE FINDINGS Segmentation:  Normal Alignment:  Normal Vertebrae:  Negative for fracture or mass Conus medullaris: Extends to the L2-3 level and appears normal. Low lying conus medullaris unchanged from the recent MRI. Paraspinal and other soft tissues: Postop fluid posteriorly at L4-5, typical for recent surgery. No mass-effect on the thecal sac. Disc levels: L1-2: Disc degeneration with disc bulging and Schmorl's node. Negative for stenosis  L2-3: Disc degeneration with diffuse disc bulging and endplate spurring. Mild spinal stenosis and mild subarticular stenosis bilaterally unchanged from the prior study. L3-4: Moderate facet hypertrophy.  Mild disc degeneration. Small amount of subdural fluid surrounds the thecal sac contributing to moderate spinal stenosis which has progressed in the interval. L4-5: Extensive bilateral laminectomy without spinal stenosis. Mild subarticular stenosis bilaterally. Shallow left foraminal disc protrusion with mild left foraminal encroachment unchanged from the prior study. Small postoperative fluid collection posterior to the thecal sac without mass-effect on the thecal sac L5-S1: Mild facet degeneration without stenosis. IMPRESSION: Multilevel cervical spondylosis. Mild spinal stenosis C4-5 and moderate spinal stenosis C5-6. Mild spinal stenosis C6-7 Multilevel thoracic degenerative change without significant stenosis or cord compression Multilevel lumbar degenerative change. Mild spinal stenosis L2-3. Moderate spinal stenosis L3-4 has progressed due to small amount of subdural fluid surrounding the thecal sac. Postop bilateral laminectomy L4-5 with wide decompression thecal sac. Typical postoperative fluid posterior to the thecal sac without significant hematoma or compression of the thecal sac. Left foraminal disc protrusion at L4-5 unchanged from the prior study with mild left foraminal narrowing. These results were called by telephone at the time of interpretation on 03/08/2018 at 10:26 am to [name] , who verbally acknowledged these results. Electronically Signed   By: Franchot Gallo M.D.   On: 03/08/2018 10:29   Mr Lumbar Spine W Wo Contrast  Result Date: 03/08/2018 CLINICAL DATA:  Lumbar laminectomy L4-5 03/04/2018. Postop bilateral leg weakness. Rule out hematoma or cord compression EXAM: MRI TOTAL SPINE WITHOUT AND WITH CONTRAST TECHNIQUE: Multisequence MR imaging of the spine from the cervical spine to the sacrum was performed prior to and following IV contrast administration for evaluation of spinal metastatic disease. CONTRAST:  61m MULTIHANCE GADOBENATE DIMEGLUMINE 529 MG/ML IV SOLN COMPARISON:   Lumbar MRI 02/01/2018 FINDINGS: MRI CERVICAL SPINE FINDINGS Alignment: Normal Vertebrae: Normal bone marrow.  Negative for fracture or mass Cord: Spinal cord signal normal. Enhancing vein in the cord at the C1 level without abnormal signal. Possible normal but prominent cord vascularity. Posterior Fossa, vertebral arteries, paraspinal tissues: Negative Disc levels: C2-3: Mild disc degeneration without stenosis C3-4: Mild disc degeneration. Central and left-sided spurring with moderate left foraminal stenosis due to spurring. C4-5: Disc degeneration. Central disc protrusion and associated spurring with cord flattening and mild spinal stenosis. Neural foramina patent bilaterally. C5-6: Small right paracentral disc protrusion. Diffuse uncinate spurring. Cord flattening with moderate spinal stenosis and mild foraminal narrowing bilaterally. C6-7: Central disc protrusion and spurring. Cord flattening with mild spinal stenosis. C7-T1: Negative MRI THORACIC SPINE FINDINGS Alignment:  Normal Vertebrae: Normal bone marrow. Negative for fracture or mass. Normal enhancement. Cord:  Normal cord signal.  No cord compression or cord lesion. Paraspinal and other soft tissues: Negative Disc levels: Multilevel mild thoracic disc degeneration. Mild disc bulging and disc space narrowing and endplate spurring throughout the thoracic spine from T2 through T12. No significant spinal or foraminal encroachment. No large disc protrusion. MRI LUMBAR SPINE FINDINGS Segmentation:  Normal Alignment:  Normal Vertebrae:  Negative for fracture or mass Conus medullaris: Extends to the L2-3 level and appears normal. Low lying conus medullaris unchanged from the recent MRI. Paraspinal and other soft tissues: Postop fluid posteriorly at L4-5, typical for recent surgery. No mass-effect on the thecal sac. Disc levels: L1-2: Disc degeneration with disc bulging and Schmorl's node. Negative for stenosis L2-3: Disc degeneration with diffuse disc bulging and  endplate spurring. Mild spinal stenosis and mild subarticular stenosis bilaterally unchanged from  the prior study. L3-4: Moderate facet hypertrophy. Mild disc degeneration. Small amount of subdural fluid surrounds the thecal sac contributing to moderate spinal stenosis which has progressed in the interval. L4-5: Extensive bilateral laminectomy without spinal stenosis. Mild subarticular stenosis bilaterally. Shallow left foraminal disc protrusion with mild left foraminal encroachment unchanged from the prior study. Small postoperative fluid collection posterior to the thecal sac without mass-effect on the thecal sac L5-S1: Mild facet degeneration without stenosis. IMPRESSION: Multilevel cervical spondylosis. Mild spinal stenosis C4-5 and moderate spinal stenosis C5-6. Mild spinal stenosis C6-7 Multilevel thoracic degenerative change without significant stenosis or cord compression Multilevel lumbar degenerative change. Mild spinal stenosis L2-3. Moderate spinal stenosis L3-4 has progressed due to small amount of subdural fluid surrounding the thecal sac. Postop bilateral laminectomy L4-5 with wide decompression thecal sac. Typical postoperative fluid posterior to the thecal sac without significant hematoma or compression of the thecal sac. Left foraminal disc protrusion at L4-5 unchanged from the prior study with mild left foraminal narrowing. These results were called by telephone at the time of interpretation on 03/08/2018 at 10:26 am to [name] , who verbally acknowledged these results. Electronically Signed   By: Franchot Gallo M.D.   On: 03/08/2018 10:29       Medical Problem List and Plan: 1.  Functional and mobility deficits secondary to lumbar stenosis with multiple-level radiculopathies s/p  Decompression and re-do.   -admit to inpatient rehab 2.  DVT Prophylaxis/Anticoagulation: Pharmaceutical: Lovenox 3. Pain Management: Oxycodone and robaxin prn effective. On gabapentin tid to help manage  neuropathic pain.  4. Mood: LCSW to follow for evaluation and support.  5. Neuropsych: This patient is capable of making decisions on her own behalf. 6. Skin/Wound Care: Monitor wound for healing.  7. Fluids/Electrolytes/Nutrition: Monitor I/O. Check lytes in am.  8. HTN: Monitor BP bid--continue to coreg bid. Monitor for orthostatic changes.  9. ABLA: Post op blood loss--monitor H/H for now. Recheck later this week. Hgb just re-checked this afternoon and stable. hgb 10.0  10. Leucocytosis: ?steroid effect. WBC's down to 7.7 today 11. Constipation:Improved--continue Miralax.  12. Anxiety disorder:  Continue ativan prn for now. I suspect some of dizziness this afternoon was anxiety related.   Post Admission Physician Evaluation: 1. Functional deficits secondary  to lumbar stenosis with multi-level radiculopathies. 2. Patient is admitted to receive collaborative, interdisciplinary care between the physiatrist, rehab nursing staff, and therapy team. 3. Patient's level of medical complexity and substantial therapy needs in context of that medical necessity cannot be provided at a lesser intensity of care such as a SNF. 4. Patient has experienced substantial functional loss from his/her baseline which was documented above under the "Functional History" and "Functional Status" headings.  Judging by the patient's diagnosis, physical exam, and functional history, the patient has potential for functional progress which will result in measurable gains while on inpatient rehab.  These gains will be of substantial and practical use upon discharge  in facilitating mobility and self-care at the household level. 5. Physiatrist will provide 24 hour management of medical needs as well as oversight of the therapy plan/treatment and provide guidance as appropriate regarding the interaction of the two. 6. The Preadmission Screening has been reviewed and patient status is unchanged unless otherwise stated above. 7. 24  hour rehab nursing will assist with bladder management, bowel management, safety, skin/wound care, disease management, medication administration, pain management and patient education  and help integrate therapy concepts, techniques,education, etc. 8. PT will assess and treat for/with: Lower extremity  strength, range of motion, stamina, balance, functional mobility, safety, adaptive techniques and equipment, NMR, orthotics, pain control, family ed.   Goals are: supervision to mod I. 9. OT will assess and treat for/with: ADL's, functional mobility, safety, upper extremity strength, adaptive techniques and equipment, NMR, pain control, family ed.   Goals are: supervision to mod I. Therapy may proceed with showering this patient. 10. SLP will assess and treat for/with: n/a.  Goals are: n/a. 11. Case Management and Social Worker will assess and treat for psychological issues and discharge planning. 12. Team conference will be held weekly to assess progress toward goals and to determine barriers to discharge. 13. Patient will receive at least 3 hours of therapy per day at least 5 days per week. 14. ELOS: 13-18 days       15. Prognosis:  excellent   I have personally performed a face to face diagnostic evaluation of this patient and formulated the key components of the plan.  Additionally, I have personally reviewed laboratory data, imaging studies, as well as relevant notes and concur with the physician assistant's documentation above.  Meredith Staggers, MD, [facility]   Bary Leriche, PA-C 03/09/2018

## 2018-03-09 NOTE — Progress Notes (Signed)
Patient transfer to inpatient rehab. Took all her belongings. Report given to [facility].

## 2018-03-09 NOTE — Progress Notes (Signed)
Occupational Therapy Treatment Patient Details Name: [name] MRN: [id number] DOB: 12/17/1960 Today's Date: 03/09/2018    History of present illness Pt is a 57 y/o female s/p Revision of Microlumbar Decompression Bilateral Lumbar Four-Five (Bilateral) and Central decompression at L4-5 with removal of partial spinous process of 4 and 5.  Exploration of the wound, evacuation of hematoma. PMH including but not limited to HTN.   OT comments  Pt demonstrating progress toward OT goals this session. She was able to demonstrate improved independence with LE dressing and bathing tasks with use of AE this session. She additionally demonstrated improved independence with toilet transfers requiring min guard assist and significantly increased time for short distance. She requires step-by-step cues for maneuvering RW and movement of R LE today. Due to current functional deficits and pt with great potential for functional recovery, updated D/C recommendation to CIR.    Follow Up Recommendations  CIR    Equipment Recommendations  3 in 1 bedside commode;Other (comment)(RW)    Recommendations for Other Services      Precautions / Restrictions Precautions Precautions: Fall;Back Precaution Comments: No brace ordered. AFO for R foot drop. Required Braces or Orthoses: Other Brace/Splint Other Brace/Splint: R MAFO, "no back brace needed" Restrictions Weight Bearing Restrictions: No       Mobility Bed Mobility Overal bed mobility: Needs Assistance Bed Mobility: Rolling;Sidelying to Sit Rolling: Supervision Sidelying to sit: Min guard   Sit to supine: Mod assist   General bed mobility comments: up in chair on arrival  Transfers Overall transfer level: Needs assistance Equipment used: Rolling walker (2 wheeled) Transfers: Sit to/from Stand Sit to Stand: Min guard         General transfer comment: Min guard assist to power up to standing position for safety.     Balance Overall  balance assessment: Needs assistance Sitting-balance support: Feet supported;Bilateral upper extremity supported Sitting balance-Leahy Scale: Fair     Standing balance support: Bilateral upper extremity supported Standing balance-Leahy Scale: Poor Standing balance comment: Relies on BUE support.                            ADL either performed or assessed with clinical judgement   ADL Overall ADL's : Needs assistance/impaired         Upper Body Bathing: Set up;Sitting   Lower Body Bathing: Minimal assistance;With adaptive equipment;Sit to/from stand   Upper Body Dressing : Set up;Sitting   Lower Body Dressing: Minimal assistance;Moderate assistance;Sit to/from stand Lower Body Dressing Details (indicate cue type and reason): Min assist for underwear with reacher. Mod assist for shoes due to AFO. Toilet Transfer: Min Systems developer Details (indicate cue type and reason): short distance ambulation with cues for RLE movement.  Toileting- Water quality scientist and Hygiene: Min guard;Sit to/from stand       Functional mobility during ADLs: Passenger transport manager     Praxis      Cognition Arousal/Alertness: Awake/alert Behavior During Therapy: WFL for tasks assessed/performed Overall Cognitive Status: Within Functional Limits for tasks assessed                                          Exercises Exercises: Other exercises Other Exercises Other Exercises: Noted tightness at R knee and heel cord and  OT facilitated improved PROM with prolonged stretch x10 at each.    Shoulder Instructions       General Comments no family present during session    Pertinent Vitals/ Pain       Pain Assessment: Faces Faces Pain Scale: Hurts little more Pain Location: R side low back  Pain Descriptors / Indicators: Guarding;Sore Pain Intervention(s): Monitored during session;Limited activity within  patient's tolerance  Home Living                                          Prior Functioning/Environment              Frequency  Min 3X/week        Progress Toward Goals  OT Goals(current goals can now be found in the care plan section)  Progress towards OT goals: Progressing toward goals  Acute Rehab OT Goals Patient Stated Goal: decrease pain OT Goal Formulation: With patient Time For Goal Achievement: 03/19/18 Potential to Achieve Goals: Good  Plan Discharge plan remains appropriate    Co-evaluation                 AM-PAC PT "6 Clicks" Daily Activity     Outcome Measure   Help from another person eating meals?: None Help from another person taking care of personal grooming?: None Help from another person toileting, which includes using toliet, bedpan, or urinal?: A Little Help from another person bathing (including washing, rinsing, drying)?: A Little Help from another person to put on and taking off regular upper body clothing?: None Help from another person to put on and taking off regular lower body clothing?: A Little 6 Click Score: 21    End of Session Equipment Utilized During Treatment: Gait belt  OT Visit Diagnosis: Unsteadiness on feet (R26.81)   Activity Tolerance Patient tolerated treatment well   Patient Left in bed;with call bell/phone within reach;with family/visitor present   Nurse Communication Mobility status;Precautions        Time: 8413-2440 OT Time Calculation (min): 43 min  Charges: OT General Charges $OT Visit: 1 Visit OT Treatments $Self Care/Home Management : 38-52 mins  Norman Herrlich, MS OTR/L  Pager: [place] [facility] Sorter 03/09/2018, 9:49 AM

## 2018-03-09 NOTE — Progress Notes (Signed)
Pt was working with PT and then stated that she was going to pass out. Physical therapy and admissions nurse sat patient down to prevent her from falling. NT got vital signs. Throughout incident pt did not lose consciousness at all just kept closing her eyes and stating that she was about to pass out. RN called [name] did not get an answer but left a message for him to call back. Will continue to monitor .

## 2018-03-09 NOTE — Plan of Care (Signed)
  Problem: Education: Goal: Knowledge of General Education information will improve Outcome: Progressing   Problem: Clinical Measurements: Goal: Will remain free from infection Outcome: Progressing   Problem: Activity: Goal: Risk for activity intolerance will decrease Outcome: Progressing   Problem: Coping: Goal: Level of anxiety will decrease Outcome: Progressing   Problem: Elimination: Goal: Will not experience complications related to bowel motility Outcome: Progressing   Problem: Pain Managment: Goal: General experience of comfort will improve Outcome: Progressing

## 2018-03-09 NOTE — PMR Pre-admission (Signed)
PMR Admission Coordinator Pre-Admission Assessment  Patient: [name] is an 57 y.o., female MRN: [id number] DOB: 1961/02/06 Height: _0  (165.1 cm) Weight: 65 kg (143 lb 4.8 oz)              Insurance Information HMO:     PPO: X     PCP:      IPA:      80/20:      OTHER:  PRIMARYIval Bible      Policy#: [id number], group #[id number]      Subscriber: Self CM Name: Case manager      Phone#: [phone]     Fax#: [phone] Pre-Cert#: [id number]      Employer: Full Time, [employer] Benefits:  Phone #: [phone]     Name: Verified online at [facility] portal  Eff. Date: 10/27/17     Deduct: $300      Out of Pocket Max: $7900      Life Max: N/A CIR: $500 co-pay, then 80%/20%      SNF: 80%/20% Outpatient: 24 visits combined PT/OT      Co-Pay: $20 with [facility], $45 with [facility], $40 with other [employer] Home Health: 80%      Co-Pay: 20% DME: 80%     Co-Pay: 20% Providers: In-network   SECONDARY: None       Emergency Contact Information Contact Information    Name Relation Home Work Mobile   Brooks,Maceo Spouse   [phone]   Melody, Cirrincione   [phone]     Current Medical History  Patient Admitting Diagnosis: Lumbar stenosis s/p decompression   History of Present Illness: [name] is a 57 year old female with history of HTN, HA, prior back injury with microdiskectomy 02/2016 with ongoing back pain who started developing difficulty walking, recent falls, pain radiating to LLE X 4 weeks PTA.  Work-up done revealing severe recurrent lateral stenosis L4-L5.  She was admitted on 03/04/2018 for foraminotomies L4-5 with lysis of epidural fibrosis and revision of bilateral hemilaminotomies by [name].  While in PACU, patient reported loss of sensation from waist down with inability to move bilateral lower extremities.  She was taken back to the OR for exploration of wound with evacuation of hematoma and removal of partial spinous processes L4 and L5.  Sensory deficits of left  lower extremity improving however she continues to have numbness in the right lower extremity from knees down.  Dr. Cheral Marker was consulted for input and question symptoms due to nerve root traction and/or postop inflammation from adhesions or "cracked back" from flexion/hyperextension during surgery.  MRI of brain was negative for acute intracranial process.  MRI of cervical, thoracic and lumbar spine showed multilevel cervical spondylosis and mild to moderate stenosis cervical and lumbar spine.  Neurology recommends EMG nerve conduction study in 4 to 6 weeks past discharge to monitor for recovery.  Therapy is ongoing and patient continues to have deficits in ability to carry on ADL tasks as well as mobility.  CIR was recommended due to functional decline and patient admitted 03/09/18.        Past Medical History  Past Medical History:  Diagnosis Date  . Anxiety   . Back pain    related to spinal stenosis and disc problem, radiates down left buttocks to leg., weakness occ.  [name] Headache   . Hypertension   . PONV (postoperative nausea and vomiting)   . Vaginal foreign object    "Uses Femring"    Family History  family history includes Breast cancer in her sister; Breast cancer (age of onset: 65) in her sister; Cancer in her sister; Lung cancer in her father; Multiple myeloma in her sister; Pancreatic cancer in her sister; Stomach cancer in her cousin; Throat cancer in her brother.  Prior Rehab/Hospitalizations:  Has the patient had major surgery during 100 days prior to admission? No  Current Medications   Current Facility-Administered Medications:  .  acetaminophen (TYLENOL) tablet 650 mg, 650 mg, Oral, Q4H PRN **OR** acetaminophen (TYLENOL) suppository 650 mg, 650 mg, Rectal, Q4H PRN, [name], MD .  acidophilus (RISAQUAD) capsule 1 capsule, 1 capsule, Oral, Daily, [name], MD, 1 capsule at 03/09/18 0927 .  alum & mag hydroxide-simeth (MAALOX/MYLANTA) 200-200-20 MG/5ML  suspension 30 mL, 30 mL, Oral, Q6H PRN, [name], MD .  bisacodyl (DULCOLAX) EC tablet 5 mg, 5 mg, Oral, Daily PRN, [name], MD .  carvedilol (COREG) tablet 12.5 mg, 12.5 mg, Oral, BID, [name], MD, 12.5 mg at 03/09/18 0927 .  diphenhydrAMINE (BENADRYL) capsule 50 mg, 50 mg, Oral, Q6H PRN, [name], MD, 50 mg at 03/07/18 2138 .  docusate sodium (COLACE) capsule 100 mg, 100 mg, Oral, BID, [name], MD, 100 mg at 03/09/18 0927 .  estrogens (conjugated) (PREMARIN) tablet 0.625 mg, 0.625 mg, Oral, Daily, [name], MD, 0.625 mg at 03/09/18 0927 .  gabapentin (NEURONTIN) capsule 300 mg, 300 mg, Oral, TID, [name], MD, 300 mg at 03/09/18 1543 .  HYDROmorphone (DILAUDID) injection 0.5 mg, 0.5 mg, Intravenous, Q2H PRN, [name], MD .  LORazepam (ATIVAN) tablet 0.5 mg, 0.5 mg, Oral, Q6H PRN, [name], MD, 0.5 mg at 03/09/18 0928 .  menthol-cetylpyridinium (CEPACOL) lozenge 3 mg, 1 lozenge, Oral, PRN **OR** phenol (CHLORASEPTIC) mouth spray 1 spray, 1 spray, Mouth/Throat, PRN, [name], MD .  methocarbamol (ROBAXIN) tablet 500 mg, 500 mg, Oral, Q6H PRN, 500 mg at 03/07/18 0549 **OR** methocarbamol (ROBAXIN) 500 mg in dextrose 5 % 50 mL IVPB, 500 mg, Intravenous, Q6H PRN, [name], MD .  ondansetron (ZOFRAN) tablet 4 mg, 4 mg, Oral, Q6H PRN **OR** ondansetron (ZOFRAN) injection 4 mg, 4 mg, Intravenous, Q6H PRN, [name], MD, 4 mg at 03/08/18 0101 .  oxyCODONE (Oxy IR/ROXICODONE) immediate release tablet 10 mg, 10 mg, Oral, Q3H PRN, [name], MD, 10 mg at 03/09/18 0316 .  oxyCODONE (Oxy IR/ROXICODONE) immediate release tablet 5 mg, 5 mg, Oral, Q3H PRN, [name], MD, 5 mg at 03/09/18 0927 .  polyethylene glycol (MIRALAX / GLYCOLAX) packet 17 g, 17 g, Oral, Daily PRN, [name], MD .  polyethylene glycol (MIRALAX / GLYCOLAX) packet 17 g, 17 g, Oral, Daily, [name], MD, 17 g at 03/09/18 1610  Patients Current Diet:   Diet Order           Diet - low sodium heart healthy        Diet regular Room service appropriate? Yes; Fluid consistency: Thin  Diet effective now          Precautions / Restrictions Precautions Precautions: Fall, Back Precaution Comments: No brace ordered. AFO for R foot drop. Other Brace/Splint: R AFO Restrictions Weight Bearing Restrictions: No   Has the patient had 2 or more falls or a fall with injury in the past year?Yes  Prior Activity Level Community (5-7x/wk): Prior to admission patient was active and fully independent.  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    Home Assistive Devices / Equipment Home Assistive Devices/Equipment: Radio producer (  specify quad or straight) Home Equipment: Shower seat, Environmental consultant - standard, Cane - single point  Prior Device Use: Indicate devices/aids used by the patient prior to current illness, exacerbation or injury? Cane  Prior Functional Level Prior Function Level of Independence: Independent with assistive device(s)(just prior to admission due to decline) Comments: (quad cane )  Self Care: Did the patient need help bathing, dressing, using the toilet or eating? Independent  Indoor Mobility: Did the patient need assistance with walking from room to room (with or without device)? Independent  Stairs: Did the patient need assistance with internal or external stairs (with or without device)? Independent  Functional Cognition: Did the patient need help planning regular tasks such as shopping or remembering to take medications? Independent  Current Functional Level Cognition  Overall Cognitive Status: Within Functional Limits for tasks assessed Orientation Level: Oriented X4    Extremity Assessment (includes Sensation/Coordination)  Upper Extremity Assessment: Overall WFL for tasks assessed  Lower Extremity Assessment: Defer to PT evaluation RLE Deficits / Details: Pt with impaired sensation to light touch throughout LE; pt reported that she  can feel something but it does not feel "normal". Pt able to partially WB through R LE with transfers but heavily utilizing bilateral UEs on RW. Pt with great difficulty with knee flexion secondary to weakness and back pain, requiring physical assistance to flex knee for positioning in preparation for standing RLE Sensation: decreased light touch, decreased proprioception RLE Coordination: decreased fine motor, decreased gross motor    ADLs  Overall ADL's : Needs assistance/impaired Eating/Feeding: Set up, Sitting Grooming: Independent Upper Body Bathing: Set up, Sitting Lower Body Bathing: Minimal assistance, With adaptive equipment, Sit to/from stand Upper Body Dressing : Set up, Sitting Lower Body Dressing: Minimal assistance, Moderate assistance, Sit to/from stand Lower Body Dressing Details (indicate cue type and reason): Min assist for underwear with reacher. Mod assist for shoes due to AFO. Toilet Transfer: Min guard, RW, BSC, Ambulation Toilet Transfer Details (indicate cue type and reason): short distance ambulation with cues for RLE movement.  Toileting- Water quality scientist and Hygiene: Min guard, Sit to/from stand Functional mobility during ADLs: Min guard, Rolling walker General ADL Comments: Per RN and pt MD stated to "go half speed" with therapy. In progress note "up with therapy" is only mention of instructions to therapy. Also reporting that MD is "considering" back brace but pt still OK to chair for the time being. Session focused on LB dressing seated in recliner, applying MAFO, and simulated stnad-pivot toilet transfers. Educated pt concerning need to keep BLE in 90-90 positioning in chair to prevent pressure on her back.     Mobility  Overal bed mobility: Needs Assistance Bed Mobility: Rolling, Sidelying to Sit Rolling: Supervision Sidelying to sit: Min guard Sit to supine: Mod assist General bed mobility comments: Cues for technique, used LLE to advance RLE to edge of  bed.  Pt able to come to sitting with min guard asistance.      Transfers  Overall transfer level: Needs assistance Equipment used: Rolling walker (2 wheeled) Transfers: Sit to/from Stand Sit to Stand: Min assist(min +1 from bed, post pre-sycopal episode, patient appears groggy and required +2 mod to stand and return to recliner chair.  ) Stand pivot transfers: Mod assist General transfer comment: Min assistance from bed, patient reports in hall, " I am going to faint."  Pt starts to close eyes and RN pulled office chair over to patient.  Vitals assessed and WNL.  BP 139/74, HR 86bpm  and O2 100% on RA.  Pt required increased assistance to stand from office chair and transfer to recliner chair.  RN informed of incident.      Ambulation / Gait / Stairs / Wheelchair Mobility  Ambulation/Gait Ambulation/Gait assistance: Mod assist, Min assist Ambulation Distance (Feet): 40 Feet Assistive device: Rolling walker (2 wheeled) Gait Pattern/deviations: Step-to pattern, Decreased step length - right, Decreased dorsiflexion - right, Trunk flexed General Gait Details: Pt required cues and asistance for weight shifting L and increasing stride length on R.  At times patient dragging RLE.  Pt required chair after 40 ft due to complaints of dizziness and reporting, "I'm gonna faint." Gait velocity: decreased Gait velocity interpretation: <1.8 ft/sec, indicate of risk for recurrent falls    Posture / Balance Balance Overall balance assessment: Needs assistance Sitting-balance support: Feet supported, Bilateral upper extremity supported Sitting balance-Leahy Scale: Fair Standing balance support: Bilateral upper extremity supported Standing balance-Leahy Scale: Poor Standing balance comment: Relies on BUE support.     Special needs/care consideration BiPAP/CPAP: No CPM: No Continuous Drip IV: No Dialysis: No         Life Vest: No Oxygen: No Special Bed: No Trach Size: No Wound Vac (area): No        Skin: WDL                              Location: Surgical incision to back  Bowel mgmt: Continent, with constipation post op; last BM 03/08/18 Bladder mgmt: Continent  Diabetic mgmt: No     Previous Home Environment Living Arrangements: Spouse/significant other, Children Available Help at Discharge: Family, Available 24 hours/day Type of Home: House Home Layout: One level, Able to live on main level with bedroom/bathroom Home Access: Stairs to enter Entrance Stairs-Rails: Right, Left Entrance Stairs-Number of Steps: 4 Bathroom Shower/Tub: Multimedia programmer: Associate Professor Accessibility: Yes Home Care Services: No Additional Comments: spouse pending knee replacement so limited in ability to help with phsycial (A)  Discharge Living Setting Plans for Discharge Living Setting: Patient's home, Lives with (comment)(Spouse and son ) Type of Home at Discharge: House Discharge Home Layout: One level Discharge Home Access: Stairs to enter Entrance Stairs-Rails: (front: both, garage: left) Entrance Stairs-Number of Steps: 4 Discharge Bathroom Shower/Tub: Walk-in shower Discharge Bathroom Toilet: Standard Discharge Bathroom Accessibility: Yes How Accessible: Accessible via walker Does the patient have any problems obtaining your medications?: No  Social/Family/Support Systems Patient Roles: Spouse, Parent, Other (Comment)(Grandparent ) Contact Information: SpouseNelida Gores [phone] Anticipated Caregiver: Ap[place], [place], who works from home  Anticipated Ambulance person Information: [phone] Ability/Limitations of Caregiver: Son works from home, patient reports none Caregiver Availability: 24/7 Discharge Plan Discussed with Primary Caregiver: Yes Is Caregiver In Agreement with Plan?: Yes Does Caregiver/Family have Issues with Lodging/Transportation while Pt is in Rehab?: No  Goals/Additional Needs Patient/Family Goal for Rehab:  PT/OT:Supervision  Expected length of stay: 12-17 days  Cultural Considerations: Baptist, no therapy on Sunday  Dietary Needs: Regular textures and thin liquids Equipment Needs: TBD Pt/Family Agrees to Admission and willing to participate: Yes Program Orientation Provided & Reviewed with Pt/Caregiver Including Roles  & Responsibilities: Yes  Barriers to Discharge: Medical stability, Home environment access/layout(steps to get into house )   Decrease burden of Care through IP rehab admission: No  Possible need for SNF placement upon discharge: No  Patient Condition: This patient's condition remains as documented in the consult dated 03/08/18, in which the  Rehabilitation Physician determined and documented that the patient's condition is appropriate for intensive rehabilitative care in an inpatient rehabilitation facility. Will admit to inpatient rehab today.  Preadmission Screen Completed By:  Gunnar Fusi, 03/09/2018 4:18 PM ______________________________________________________________________   Discussed status with Dr. Naaman Plummer on 03/09/18 at 1625 and received telephone approval for admission today.  Admission Coordinator:  Gunnar Fusi, time 1625/Date 03/09/18

## 2018-03-10 ENCOUNTER — Inpatient Hospital Stay (HOSPITAL_COMMUNITY): Payer: 59 | Admitting: Physical Therapy

## 2018-03-10 ENCOUNTER — Inpatient Hospital Stay (HOSPITAL_COMMUNITY): Payer: 59 | Admitting: Occupational Therapy

## 2018-03-10 DIAGNOSIS — F411 Generalized anxiety disorder: Secondary | ICD-10-CM

## 2018-03-10 DIAGNOSIS — F339 Major depressive disorder, recurrent, unspecified: Secondary | ICD-10-CM

## 2018-03-10 DIAGNOSIS — F418 Other specified anxiety disorders: Secondary | ICD-10-CM

## 2018-03-10 LAB — COMPREHENSIVE METABOLIC PANEL
ALBUMIN: 2.8 g/dL — AB (ref 3.5–5.0)
ALT: 33 U/L (ref 14–54)
ANION GAP: 9 (ref 5–15)
AST: 21 U/L (ref 15–41)
Alkaline Phosphatase: 52 U/L (ref 38–126)
BILIRUBIN TOTAL: 0.3 mg/dL (ref 0.3–1.2)
BUN: 10 mg/dL (ref 6–20)
CO2: 31 mmol/L (ref 22–32)
Calcium: 8.5 mg/dL — ABNORMAL LOW (ref 8.9–10.3)
Chloride: 96 mmol/L — ABNORMAL LOW (ref 101–111)
Creatinine, Ser: 1.05 mg/dL — ABNORMAL HIGH (ref 0.44–1.00)
GFR calc Af Amer: >60 mL/min (ref >60)
GFR calc non Af Amer: 58 mL/min — ABNORMAL LOW (ref >60)
GLUCOSE: 98 mg/dL (ref 65–99)
POTASSIUM: 4.1 mmol/L (ref 3.5–5.1)
SODIUM: 136 mmol/L (ref 135–145)
TOTAL PROTEIN: 5.5 g/dL — AB (ref 6.5–8.1)

## 2018-03-10 NOTE — Evaluation (Signed)
Occupational Therapy Assessment and Plan  Patient Details  Name: [name] MRN: [id number] Date of Birth: 20-Apr-1961  OT Diagnosis: lumbago (low back pain), muscle weakness (generalized) and decreased sensation Rehab Potential: Rehab Potential (ACUTE ONLY): Fair ELOS: 7-10 days   Today's Date: 03/10/2018 OT Individual Time: 1300-1411 OT Individual Time Calculation (min): 71 min     Problem List:  Patient Active Problem List   Diagnosis Date Noted  . Lumbar radiculopathy 03/09/2018  . Paraparesis ([place]) 03/09/2018  . Essential hypertension   . Chronic nonintractable headache   . Leukocytosis   . Acute blood loss anemia   . Postoperative pain   . Generalized weakness   . Spinal stenosis at L4-L5 level 03/04/2018  . Spinal stenosis of lumbar region 12/28/2015  . Chest pain 04/16/2015    Past Medical History:  Past Medical History:  Diagnosis Date  . Anxiety   . Back pain    related to spinal stenosis and disc problem, radiates down left buttocks to leg., weakness occ.  [name] Headache   . Hypertension   . PONV (postoperative nausea and vomiting)   . Vaginal foreign object    "Uses Femring"   Past Surgical History:  Past Surgical History:  Procedure Laterality Date  . ABDOMINAL HYSTERECTOMY    . CARDIAC CATHETERIZATION N/A 04/18/2015   Procedure: Left Heart Cath and Coronary Angiography;  Surgeon: [name], MD;  Location: [facility];  Service: Cardiovascular;  Laterality: N/A;  . FOOT SURGERY Bilateral    [place] "bunion,bone spur, tendon" (1) -6'16, (1)-10'16  . LUMBAR LAMINECTOMY/DECOMPRESSION MICRODISCECTOMY Bilateral 12/28/2015   Procedure: MICRO LUMBAR DECOMPRESSION L4 - L5 BILATERALLY;  Surgeon: [name], MD;  Location: [facility];  Service: Orthopedics;  Laterality: Bilateral;  . LUMBAR LAMINECTOMY/DECOMPRESSION MICRODISCECTOMY Bilateral 03/04/2018   Procedure: Revision of Microlumbar Decompression Bilateral Lumbar Four-Five;  Surgeon:  [name], MD;  Location: [facility];  Service: Orthopedics;  Laterality: Bilateral;  90 mins  . WISDOM TOOTH EXTRACTION    . WOUND EXPLORATION N/A 03/04/2018   Procedure: EXPLORATION OF LUMBAR DECOMPRESSION WOUND;  Surgeon: [name], MD;  Location: [facility];  Service: Orthopedics;  Laterality: N/A;    Assessment & Plan Clinical Impression: Patient is a 57 y.o. year old female with history of HTN, HA, prior back injury with microdiskectomy 02/2016 with ongoing back pain who started developing difficulty walking, recent falls, pain radiating to LLE X 4 weeks PTA.Work-up done revealing severe recurrent lateral stenosis L4-L5. She was admitted on 03/04/2018 for foraminotomies L4-5 with lysis of epidural fibrosis and revision of bilateral hemilaminotomies by [name].While in PACU, patient reported loss of sensation from waist down with inability to move bilateral lower extremities. She was taken back to the OR for exploration of wound with evacuation of hematoma and removal of partial spinous processes L4 and L5. Sensory deficits of left lower extremity improving however she continues to have numbness in the right lower extremity from knees down. Pt experienced some dizziness this afternoon while working with therapy which apparently was self-limited, resolving once she got back in bed.  Dr. Cheral Marker was consulted for input and question symptoms due to nerve root traction and/or postop inflammation from adhesions or "cracked back" from flexion/hyperextension during surgery. MRI of brain was negativefor acute intracranial process. MRI of cervical,thoracic and lumbar spine showed multilevel cervicalspondylosisandmild to moderate stenosiscervical and lumbar spine. Neurology recommends EMG nerve conduction study in 4 to 6 weeks past discharge to monitor for recovery. Therapy is  ongoing and patient continues to have deficits in ability to carry on ADL tasks as well as mobility. CIR was recommended  due to functional decline. Patient transferred to CIR on 03/09/2018 .    Patient currently requires min with basic self-care skills secondary to muscle weakness, decreased cardiorespiratoy endurance and decreased sitting balance, decreased standing balance and decreased balance strategies.  Prior to hospitalization, patient could complete ADLs and IADLs with independent .  Patient will benefit from skilled intervention to increase independence with basic self-care skills and increase level of independence with iADL prior to discharge home with care partner.  Anticipate patient will require intermittent supervision and follow up home health.  OT - End of Session Activity Tolerance: Decreased this session Endurance Deficit: Yes Endurance Deficit Description: limited by pain OT Assessment Rehab Potential (ACUTE ONLY): Fair OT Barriers to Discharge: Other (comments) OT Barriers to Discharge Comments: none known at this time OT Patient demonstrates impairments in the following area(s): Balance;Endurance;Pain;Safety OT Basic ADL's Functional Problem(s): Grooming;Bathing;Dressing;Toileting OT Transfers Functional Problem(s): Toilet;Tub/Shower OT Additional Impairment(s): None OT Plan OT Intensity: Minimum of 1-2 x/day, 45 to 90 minutes OT Frequency: 5 out of 7 days OT Duration/Estimated Length of Stay: 7-10 days OT Treatment/Interventions: Balance/vestibular training;Self Care/advanced ADL retraining;Therapeutic Exercise;Wheelchair propulsion/positioning;DME/adaptive equipment instruction;UE/LE Strength taining/ROM;Community reintegration;Patient/family education;UE/LE Coordination activities;Discharge planning;Functional mobility training;Therapeutic Activities OT Self Feeding Anticipated Outcome(s): n/a OT Basic Self-Care Anticipated Outcome(s): Mod I OT Toileting Anticipated Outcome(s): mod I  OT Bathroom Transfers Anticipated Outcome(s): mod I - toilet transfer and Supervision - shower  transfer OT Recommendation Recommendations for Other Services: Other (comment)(none at this time) Patient destination: Home Follow Up Recommendations: 24 hour supervision/assistance;Home health OT Equipment Recommended: To be determined   Skilled Therapeutic Intervention OT educated pt on OT purpose, POC, and goals with pt verbalizing understanding and agreement. Pt very fatigued from prior therapy sessions. Pt reports increasing pain throughout session with RN notified and medication given at end of session. Pt ambulating 10' with RW x 2 this session with steady assistance. Pt declined ADLs this session. Pt propelled wheelchair with increased time and supervision 100' towards ADL apartment. OT began education on kitchen mobility for safety with making snack and getting drink with RW while maintaining precautions and increasing safety. Pt assisted back tor room at end of session and returned to room with min verbal cues for twisting when turning to enter bed. Bed alarm activated and call bell within reach upon exiting the room.   OT Evaluation Precautions/Restrictions  Precautions Precautions: Fall;Back Precaution Comments: No brace ordered. AFO for R foot drop. Required Braces or Orthoses: Other Brace/Splint Other Brace/Splint: R AFO Restrictions Weight Bearing Restrictions: No  Pain Pain Assessment Pain Scale: 0-10 Pain Score: 5  Faces Pain Scale: Hurts whole lot Pain Type: Acute pain Pain Location: Back Pain Orientation: Lower Pain Descriptors / Indicators: Aching;Guarding Pain Frequency: Intermittent Pain Onset: With Activity Patients Stated Pain Goal: 2 Pain Intervention(s): RN made aware;Repositioned Home Living/Prior [place] expects to be discharged to:: Private residence Living Arrangements: Spouse/significant other, Children Available Help at Discharge: Family, Available 24 hours/day Type of Home: House Home Access: Stairs to  enter [employer] of Steps: 4 Entrance Stairs-Rails: Right, Left, Can reach both Home Layout: Able to live on main level with bedroom/bathroom, One level Bathroom Shower/Tub: Multimedia programmer: Standard Bathroom Accessibility: Yes Additional Comments: spouse pending knee replacement so limited in ability to help with phsycial (A)  Lives With: Spouse, Family Prior Function Level of Independence:  Independent with basic ADLs, Independent with gait, Independent with transfers  Able to Take Stairs?: Yes Driving: Yes Vocation: Full time employment Vocation Requirements: works in lab at [employer] Baseline Vision/History: Wears glasses Wears Glasses: Reading only Cognition Overall Cognitive Status: Within Functional Limits for tasks assessed Arousal/Alertness: Awake/alert Orientation Level: Person;Place;Situation Person: Oriented Place: Oriented Situation: Oriented Year: 2019 Month: May Day of Week: Correct Immediate Memory Recall: Sock;Blue;Bed Memory Recall: Sock;Blue;Bed Memory Recall Sock: Without Cue Memory Recall Blue: Without Cue Memory Recall Bed: Without Cue Sensation Sensation Light Touch: Impaired Detail Light Touch Impaired Details: Impaired RLE Stereognosis: Not tested Hot/Cold: Not tested Proprioception: Impaired Detail Proprioception Impaired Details: Impaired RLE Coordination Gross Motor Movements are Fluid and Coordinated: No Fine Motor Movements are Fluid and Coordinated: Yes Coordination and Movement Description: Rt knee held in extension, decreased hip flexion Motor  Motor Motor: Abnormal tone;Abnormal postural alignment and control Motor - Skilled Clinical Observations: increased extensor tone Rt knee Mobility  Bed Mobility Bed Mobility: Supine to Sit;Sit to Supine Rolling Right: 5: Supervision Rolling Left: 5: Supervision Supine to Sit: 4: Min assist Sit to Supine: 4: Min assist  Trunk/Postural Assessment  Cervical  Assessment Cervical Assessment: Within Functional Limits Thoracic Assessment Thoracic Assessment: Within Functional Limits Lumbar Assessment Lumbar Assessment: Exceptions to WFL(multiple back surgeries) Postural Control Postural Control: Deficits on evaluation Righting Reactions: delayed due to pain  Balance Balance Balance Assessed: Yes Dynamic Standing Balance Dynamic Standing - Level of Assistance: 4: Min assist Dynamic Standing - Comments: min A with RW, mod A without device Extremity/Trunk Assessment RUE Assessment RUE Assessment: Within Functional Limits LUE Assessment LUE Assessment: Within Functional Limits   See Function Navigator for Current Functional Status.   Refer to Care Plan for Long Term Goals  Recommendations for other services: None    Discharge Criteria: Patient will be discharged from OT if patient refuses treatment 3 consecutive times without medical reason, if treatment goals not met, if there is a change in medical status, if patient makes no progress towards goals or if patient is discharged from hospital.  The above assessment, treatment plan, treatment alternatives and goals were discussed and mutually agreed upon: by patient  [name] 03/10/2018, 2:07 PM

## 2018-03-10 NOTE — Consult Note (Signed)
Neuropsychological Consultation   Patient:   [name]   DOB:   [birth year]  MR Number:  [id number]  Location:  [facility] [address] [id number] [name] [address] Dept: [place]: [phone]           Date of Service:   03/10/2018  Start Time:   2:15 PM End Time:   3:15 PM  Provider/Observer:  Ilean Skill, Psy.D.       Clinical Neuropsychologist       Billing Code/Service: [phone] 4 Units  Chief Complaint:    [name] is a 57 year old female has a prior history of hypertension, headaches, and prior back injury with micro-discectomy on 02/26/2016.  The patient had had ongoing back pain who started developing difficulty walking with recent falls and pain radiating down her lower leg over the past prior 4 weeks.  The patient had imaging work-up revealing severe recurrent lateral stenosis L4-L5.  The patient had revision from prior surgery on 03/04/2018 and while in PACU the patient reported loss of sensation from waist down.  She was returned to the operating room for exploratory review of the wound and evacuation of a hematoma and removal of partial spinous process L4 and L5.  The patient had ongoing weakness and was referred for comprehensive rehabilitation program.  The patient has had a lot of anxiety and fear initially while in the rehab program.  The patient reports that there was a time on 01/29/2013 that she became very depressed and began feeling like she was going to not recover functioning and became overwhelmed and depressed.  The patient reports that her biggest fear is that if she moves in a certain way during therapies that she will become permanently paralyzed.  This fear has essentially inhibited the patient's ability to fully engage in physical therapy and has been overwhelming to her emotionally.  However, as today is progressed and she did very well in therapies the  patient reports that this fears beginning to reduce.  Reason for Service:  EUM:PNTIRW D [name] is a 57 year old female with history of HTN, HA, prior back injury with microdiskectomy 02/2016 with ongoing back pain who started developing difficulty walking, recent falls, pain radiating to LLE X 4 weeks PTA.Work-up done revealing severe recurrent lateral stenosis L4-L5. She was admitted on 03/04/2018 for foraminotomies L4-5 with lysis of epidural fibrosis and revision of bilateral hemilaminotomies by [name].While in PACU, patient reported loss of sensation from waist down with inability to move bilateral lower extremities. She was taken back to the OR for exploration of wound with evacuation of hematoma and removal of partial spinous processes L4 and L5. Sensory deficits of left lower extremity improving however she continues to have numbness in the right lower extremity from knees down. Pt experienced some dizziness this afternoon while working with therapy which apparently was self-limited, resolving once she got back in bed.  Dr. Cheral Marker was consulted for input and question symptoms due to nerve root traction and/or postop inflammation from adhesions or "cracked back" from flexion/hyperextension during surgery. MRI of brain was negativefor acute intracranial process. MRI of cervical,thoracic and lumbar spine showed multilevel cervicalspondylosisandmild to moderate stenosiscervical and lumbar spine. Neurology recommends EMG nerve conduction study in 4 to 6 weeks past discharge to monitor for recovery. Therapy is ongoing and patient continues to have deficits in ability to carry on ADL tasks as well as mobility. CIR was recommended due  to functional decline.    Current Status:  The patient had an episode recently where she became overwhelmed with fear and worry that she was either going to move and permanently damage her spinal cord in some way or that she would not recover.  The  patient had a depressive response to these feelings and became overwhelmed.  We were able to review what is happened and the fact that the patient is actually recovering and doing well and that there is no indication that her back is not stable and that she is not in danger of some move being done during physical therapies that would cause further harm or damage.  The patient's mood state was much improved today and further improved after our visit today.  Behavioral Observation: [name]  presents as a 57 y.o.-year-old Right African American Female who appeared her stated age. her dress was Appropriate and she was Well Groomed and her manners were Appropriate to the situation.  her participation was indicative of Appropriate and Attentive behaviors.  There were any physical disabilities noted.  she displayed an appropriate level of cooperation and motivation.     Interactions:    Active Appropriate and Attentive  Attention:   within normal limits and attention span and concentration were age appropriate  Memory:   within normal limits; recent and remote memory intact  Visuo-spatial:  within normal limits  Speech (Volume):  normal  Speech:   normal; normal  Thought Process:  Coherent and Relevant  Though Content:  WNL; not suicidal and not homicidal  Orientation:   person, place, time/date and situation  Judgment:   Good  Planning:   Good  Affect:    Anxious  Mood:    Anxious and Depressed  Insight:   Good  Intelligence:   normal   Medical History:   Past Medical History:  Diagnosis Date  . Anxiety   . Back pain    related to spinal stenosis and disc problem, radiates down left buttocks to leg., weakness occ.  [name] Headache   . Hypertension   . PONV (postoperative nausea and vomiting)   . Vaginal foreign object    "Uses Femring"    Psychiatric History:  The patient does have a prior history of anxiety and the recent events have exacerbated some of her anxiety  and worry.  At one point over the past couple of days her anxiety and worry got to the point that she was fearful that she would not recover and she became very fearful that if she moved the wrong way she would damage her back which was having a significant inhibitory effect on her progress through physical and occupational therapies.  Family Med/Psych History:  Family History  Problem Relation Age of Onset  . Lung cancer Father   . Pancreatic cancer Sister   . Breast cancer Sister 79  . Throat cancer Brother   . Cancer Sister   . Multiple myeloma Sister   . Breast cancer Sister        diagnosed in her 37's  . Stomach cancer Cousin   . Colon cancer Neg Hx     Risk of Suicide/Violence: low the patient denies any current suicidal or homicidal ideation.  She did verbalize yesterday at one point to a nurse that she did not want to live like this although she acknowledges now that while this was a true feeling at the moment that she was overwhelmed with fear and depression at the moment  and fear that she would not recover letter to those responses.  The patient denies any current suicidal or homicidal ideation.  Impression/DX:  KASUMI DITULLIO is a 57 year old female has a prior history of hypertension, headaches, and prior back injury with micro-discectomy on 02/26/2016.  The patient had had ongoing back pain who started developing difficulty walking with recent falls and pain radiating down her lower leg over the past prior 4 weeks.  The patient had imaging work-up revealing severe recurrent lateral stenosis L4-L5.  The patient had revision from prior surgery on 03/04/2018 and while in PACU the patient reported loss of sensation from waist down.  She was returned to the operating room for exploratory review of the wound and evacuation of a hematoma and removal of partial spinous process L4 and L5.  The patient had ongoing weakness and was referred for comprehensive rehabilitation program.  The  patient has had a lot of anxiety and fear initially while in the rehab program.  The patient reports that there was a time on 01/29/2013 that she became very depressed and began feeling like she was going to not recover functioning and became overwhelmed and depressed.  The patient reports that her biggest fear is that if she moves in a certain way during therapies that she will become permanently paralyzed.  This fear has essentially inhibited the patient's ability to fully engage in physical therapy and has been overwhelming to her emotionally.  However, as today is progressed and she did very well in therapies the patient reports that this fears beginning to reduce.  The patient had an episode recently where she became overwhelmed with fear and worry that she was either going to move and permanently damage her spinal cord in some way or that she would not recover.  The patient had a depressive response to these feelings and became overwhelmed.  We were able to review what is happened and the fact that the patient is actually recovering and doing well and that there is no indication that her back is not stable and that she is not in danger of some move being done during physical therapies that would cause further harm or damage.  The patient's mood state was much improved today and further improved after our visit today.  Disposition/Plan:  I will see the patient again the first of next week.  I instructed the patient on how to cope with and deal with any acute onset of anxiety or depressive feelings.  We worked on adjusting to her current status and understanding the normal course of improvement in progress that we expect following her surgical interventions and spinal stenosis.  The patient reports that she can see an improvement over the past day and is feeling much more hopeful.  We particularly worked on issues related to the patient's fear that if she moves just the wrong way that she will cause  significant damage to her spinal cord.  Diagnosis:    Anxiety/depressive response to acute medical status        Electronically Signed   _______________________ Ilean Skill, Psy.D.

## 2018-03-10 NOTE — Progress Notes (Signed)
PMR Admission Coordinator Pre-Admission Assessment  Patient: [name] is an 56 y.o., female MRN: [id number] DOB: 07/11/1961 Height: 5' 5" (165.1 cm) Weight: 65 kg (143 lb 4.8 oz)                                                                                                                                                  Insurance Information HMO:     PPO: X     PCP:      IPA:      80/20:      OTHER:  PRIMARY: [facility]      Policy#: [id number], group #[id number]      Subscriber: Self CM Name: Case manager      Phone#: [phone]     Fax#: [phone] Pre-Cert#: [id number]      Employer: Full Time, [place] Benefits:  Phone #: [phone]     Name: Verified online at [facility] portal  Eff. Date: 10/27/17     Deduct: $300      Out of Pocket Max: $7900      Life Max: N/A CIR: $500 co-pay, then 80%/20%      SNF: 80%/20% Outpatient: 24 visits combined PT/OT      Co-Pay: $20 with [facility], $45 with [facility], $40 with other [employer] Home Health: 80%      Co-Pay: 20% DME: 80%     Co-Pay: 20% Providers: In-network   SECONDARY: None       Emergency Contact Information         Contact Information    Name Relation Home Work Mobile   Brooks,Maceo Spouse   [phone]   Billiot, Emmanuel Son   [phone]     Current Medical History  Patient Admitting Diagnosis: Lumbar stenosis s/p decompression   History of Present Illness: [name] is a 57 year old female with history of HTN, HA, prior back injury with microdiskectomy 02/2016 with ongoing back pain who started developing difficulty walking, recent falls, pain radiating to LLE X 4 weeks PTA.Work-up done revealing severe recurrent lateral stenosis L4-L5. She was admitted on 03/04/2018 for foraminotomies L4-5 with lysis of epidural fibrosis and revision of bilateral hemilaminotomies by Dr. Beane. While in PACU, patient reported loss of sensation from waist down with inability to move bilateral lower extremities. She  was taken back to the OR for exploration of wound with evacuation of hematoma and removal of partial spinous processes L4 and L5. Sensory deficits of left lower extremity improving however she continues to have numbness in the right lower extremity from knees down.  Dr. Lindzen was consulted for input and question symptoms due to nerve root traction and/or postop inflammation from adhesions or "cracked back" from flexion/hyperextension during surgery. MRI of brain was negativefor acute intracranial process. MRI of cervical,thoracic and lumbar spine showed multilevel cervicalspondylosisandmild to moderate stenosiscervical and lumbar   spine. Neurology recommends EMG nerve conduction study in 4 to 6 weeks past discharge to monitor for recovery. Therapy is ongoing and patient continues to have deficits in ability to carry on ADL tasks as well as mobility. CIR was recommended due to functional decline and patient admitted 03/09/18.    Past Medical History      Past Medical History:  Diagnosis Date  . Anxiety   . Back pain    related to spinal stenosis and disc problem, radiates down left buttocks to leg., weakness occ.  [name] Headache   . Hypertension   . PONV (postoperative nausea and vomiting)   . Vaginal foreign object    "Uses Femring"    Family History  family history includes Breast cancer in her sister; Breast cancer (age of onset: 20) in her sister; Cancer in her sister; Lung cancer in her father; Multiple myeloma in her sister; Pancreatic cancer in her sister; Stomach cancer in her cousin; Throat cancer in her brother.  Prior Rehab/Hospitalizations:  Has the patient had major surgery during 100 days prior to admission? No  Current Medications   Current Facility-Administered Medications:  .  acetaminophen (TYLENOL) tablet 650 mg, 650 mg, Oral, Q4H PRN **OR** acetaminophen (TYLENOL) suppository 650 mg, 650 mg, Rectal, Q4H PRN, [name], MD .  acidophilus  (RISAQUAD) capsule 1 capsule, 1 capsule, Oral, Daily, [name], MD, 1 capsule at 03/09/18 0927 .  alum & mag hydroxide-simeth (MAALOX/MYLANTA) 200-200-20 MG/5ML suspension 30 mL, 30 mL, Oral, Q6H PRN, [name], MD .  bisacodyl (DULCOLAX) EC tablet 5 mg, 5 mg, Oral, Daily PRN, [name], MD .  carvedilol (COREG) tablet 12.5 mg, 12.5 mg, Oral, BID, [name], MD, 12.5 mg at 03/09/18 0927 .  diphenhydrAMINE (BENADRYL) capsule 50 mg, 50 mg, Oral, Q6H PRN, [name], MD, 50 mg at 03/07/18 2138 .  docusate sodium (COLACE) capsule 100 mg, 100 mg, Oral, BID, [name], MD, 100 mg at 03/09/18 0927 .  estrogens (conjugated) (PREMARIN) tablet 0.625 mg, 0.625 mg, Oral, Daily, [name], MD, 0.625 mg at 03/09/18 0927 .  gabapentin (NEURONTIN) capsule 300 mg, 300 mg, Oral, TID, [name], MD, 300 mg at 03/09/18 1543 .  HYDROmorphone (DILAUDID) injection 0.5 mg, 0.5 mg, Intravenous, Q2H PRN, [name], MD .  LORazepam (ATIVAN) tablet 0.5 mg, 0.5 mg, Oral, Q6H PRN, [name], MD, 0.5 mg at 03/09/18 0928 .  menthol-cetylpyridinium (CEPACOL) lozenge 3 mg, 1 lozenge, Oral, PRN **OR** phenol (CHLORASEPTIC) mouth spray 1 spray, 1 spray, Mouth/Throat, PRN, [name], MD .  methocarbamol (ROBAXIN) tablet 500 mg, 500 mg, Oral, Q6H PRN, 500 mg at 03/07/18 0549 **OR** methocarbamol (ROBAXIN) 500 mg in dextrose 5 % 50 mL IVPB, 500 mg, Intravenous, Q6H PRN, [name], MD .  ondansetron (ZOFRAN) tablet 4 mg, 4 mg, Oral, Q6H PRN **OR** ondansetron (ZOFRAN) injection 4 mg, 4 mg, Intravenous, Q6H PRN, [name], MD, 4 mg at 03/08/18 0101 .  oxyCODONE (Oxy IR/ROXICODONE) immediate release tablet 10 mg, 10 mg, Oral, Q3H PRN, [name], MD, 10 mg at 03/09/18 0316 .  oxyCODONE (Oxy IR/ROXICODONE) immediate release tablet 5 mg, 5 mg, Oral, Q3H PRN, [name], MD, 5 mg at 03/09/18 0927 .  polyethylene glycol (MIRALAX / GLYCOLAX) packet 17 g, 17 g, Oral, Daily  PRN, [name], MD .  polyethylene glycol (MIRALAX / GLYCOLAX) packet 17 g, 17 g, Oral, Daily, [name], MD, 17 g at 03/09/18 7048  Patients Current Diet:  Diet Order           Diet - low sodium heart healthy        Diet regular Room service appropriate? Yes; Fluid consistency: Thin  Diet effective now          Precautions / Restrictions Precautions Precautions: Fall, Back Precaution Comments: No brace ordered. AFO for R foot drop. Other Brace/Splint: R AFO Restrictions Weight Bearing Restrictions: No   Has the patient had 2 or more falls or a fall with injury in the past year?Yes  Prior Activity Level Community (5-7x/wk): Prior to admission patient was active and fully independent.  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    Home Assistive Devices / Equipment Home Assistive Devices/Equipment: Cane (specify quad or straight) Home Equipment: Shower seat, Walker - standard, Cane - single point  Prior Device Use: Indicate devices/aids used by the patient prior to current illness, exacerbation or injury? Cane  Prior Functional Level Prior Function Level of Independence: Independent with assistive device(s)(just prior to admission due to decline) Comments: (quad cane )  Self Care: Did the patient need help bathing, dressing, using the toilet or eating? Independent  Indoor Mobility: Did the patient need assistance with walking from room to room (with or without device)? Independent  Stairs: Did the patient need assistance with internal or external stairs (with or without device)? Independent  Functional Cognition: Did the patient need help planning regular tasks such as shopping or remembering to take medications? Independent  Current Functional Level Cognition  Overall Cognitive Status: Within Functional Limits for tasks assessed Orientation Level: Oriented X4    Extremity Assessment (includes Sensation/Coordination)  Upper Extremity  Assessment: Overall WFL for tasks assessed  Lower Extremity Assessment: Defer to PT evaluation RLE Deficits / Details: Pt with impaired sensation to light touch throughout LE; pt reported that she can feel something but it does not feel "normal". Pt able to partially WB through R LE with transfers but heavily utilizing bilateral UEs on RW. Pt with great difficulty with knee flexion secondary to weakness and back pain, requiring physical assistance to flex knee for positioning in preparation for standing RLE Sensation: decreased light touch, decreased proprioception RLE Coordination: decreased fine motor, decreased gross motor    ADLs  Overall ADL's : Needs assistance/impaired Eating/Feeding: Set up, Sitting Grooming: Independent Upper Body Bathing: Set up, Sitting Lower Body Bathing: Minimal assistance, With adaptive equipment, Sit to/from stand Upper Body Dressing : Set up, Sitting Lower Body Dressing: Minimal assistance, Moderate assistance, Sit to/from stand Lower Body Dressing Details (indicate cue type and reason): Min assist for underwear with reacher. Mod assist for shoes due to AFO. Toilet Transfer: Min guard, RW, BSC, Ambulation Toilet Transfer Details (indicate cue type and reason): short distance ambulation with cues for RLE movement.  Toileting- Clothing Manipulation and Hygiene: Min guard, Sit to/from stand Functional mobility during ADLs: Min guard, Rolling walker General ADL Comments: Per RN and pt MD stated to "go half speed" with therapy. In progress note "up with therapy" is only mention of instructions to therapy. Also reporting that MD is "considering" back brace but pt still OK to chair for the time being. Session focused on LB dressing seated in recliner, applying MAFO, and simulated stnad-pivot toilet transfers. Educated pt concerning need to keep BLE in 90-90 positioning in chair to prevent pressure on her back.     Mobility  Overal bed mobility: Needs  Assistance Bed Mobility: Rolling, Sidelying to Sit Rolling: Supervision Sidelying to sit: Min   guard Sit to supine: Mod assist General bed mobility comments: Cues for technique, used LLE to advance RLE to edge of bed.  Pt able to come to sitting with min guard asistance.      Transfers  Overall transfer level: Needs assistance Equipment used: Rolling walker (2 wheeled) Transfers: Sit to/from Stand Sit to Stand: Min assist(min +1 from bed, post pre-sycopal episode, patient appears groggy and required +2 mod to stand and return to recliner chair.  ) Stand pivot transfers: Mod assist General transfer comment: Min assistance from bed, patient reports in hall, " I am going to faint."  Pt starts to close eyes and RN pulled office chair over to patient.  Vitals assessed and WNL.  BP 139/74, HR 86bpm and O2 100% on RA.  Pt required increased assistance to stand from office chair and transfer to recliner chair.  RN informed of incident.      Ambulation / Gait / Stairs / Wheelchair Mobility  Ambulation/Gait Ambulation/Gait assistance: Mod assist, Min assist Ambulation Distance (Feet): 40 Feet Assistive device: Rolling walker (2 wheeled) Gait Pattern/deviations: Step-to pattern, Decreased step length - right, Decreased dorsiflexion - right, Trunk flexed General Gait Details: Pt required cues and asistance for weight shifting L and increasing stride length on R.  At times patient dragging RLE.  Pt required chair after 40 ft due to complaints of dizziness and reporting, "I'm gonna faint." Gait velocity: decreased Gait velocity interpretation: <1.8 ft/sec, indicate of risk for recurrent falls    Posture / Balance Balance Overall balance assessment: Needs assistance Sitting-balance support: Feet supported, Bilateral upper extremity supported Sitting balance-Leahy Scale: Fair Standing balance support: Bilateral upper extremity supported Standing balance-Leahy Scale: Poor Standing balance comment:  Relies on BUE support.     Special needs/care consideration BiPAP/CPAP: No CPM: No Continuous Drip IV: No Dialysis: No         Life Vest: No Oxygen: No Special Bed: No Trach Size: No Wound Vac (area): No       Skin: WDL                              Location: Surgical incision to back  Bowel mgmt: Continent, with constipation post op; last BM 03/08/18 Bladder mgmt: Continent  Diabetic mgmt: No     Previous Home Environment Living Arrangements: Spouse/significant other, Children Available Help at Discharge: Family, Available 24 hours/day Type of Home: House Home Layout: One level, Able to live on main level with bedroom/bathroom Home Access: Stairs to enter Entrance Stairs-Rails: Right, Left Entrance Stairs-Number of Steps: 4 Bathroom Shower/Tub: Multimedia programmer: Associate Professor Accessibility: Yes Home Care Services: No Additional Comments: spouse pending knee replacement so limited in ability to help with phsycial (A)  Discharge Living Setting Plans for Discharge Living Setting: Patient's home, Lives with (comment)(Spouse and son ) Type of Home at Discharge: House Discharge Home Layout: One level Discharge Home Access: Stairs to enter Entrance Stairs-Rails: (front: both, garage: left) Entrance Stairs-Number of Steps: 4 Discharge Bathroom Shower/Tub: Walk-in shower Discharge Bathroom Toilet: Standard Discharge Bathroom Accessibility: Yes How Accessible: Accessible via walker Does the patient have any problems obtaining your medications?: No  Social/Family/Support Systems Patient Roles: Spouse, Parent, Other (Comment)(Grandparent ) Contact Information: SpouseNelida Gores [phone] Anticipated Caregiver: Larri, [place], who works from home  Anticipated Ambulance person Information: [phone] Ability/Limitations of Caregiver: Son works from home, patient reports none Caregiver Availability: 24/7 Discharge Plan Discussed with  Primary  Caregiver: Yes Is Caregiver In Agreement with Plan?: Yes Does Caregiver/Family have Issues with Lodging/Transportation while Pt is in Rehab?: No  Goals/Additional Needs Patient/Family Goal for Rehab: PT/OT:Supervision  Expected length of stay: 12-17 days  Cultural Considerations: Baptist, no therapy on Sunday  Dietary Needs: Regular textures and thin liquids Equipment Needs: TBD Pt/Family Agrees to Admission and willing to participate: Yes Program Orientation Provided & Reviewed with Pt/Caregiver Including Roles  & Responsibilities: Yes  Barriers to Discharge: Medical stability, Home environment access/layout(steps to get into house )   Decrease burden of Care through IP rehab admission: No  Possible need for SNF placement upon discharge: No  Patient Condition: This patient's condition remains as documented in the consult dated 03/08/18, in which the Rehabilitation Physician determined and documented that the patient's condition is appropriate for intensive rehabilitative care in an inpatient rehabilitation facility. Will admit to inpatient rehab today.  Preadmission Screen Completed By:  Gunnar Fusi, 03/09/2018 4:18 PM ______________________________________________________________________   Discussed status with Dr. Naaman Plummer on 03/09/18 at 1625 and received telephone approval for admission today.  Admission Coordinator:  Gunnar Fusi, time 1625/Date 03/09/18             Cosigned by: Meredith Staggers, MD at 03/09/2018 4:48 PM  Revision History

## 2018-03-10 NOTE — Progress Notes (Signed)
[facility]ILITATION     PROGRESS NOTE    Subjective/Complaints: Had a reasonable night. Stated "I don't want to live like this" when I came into the room---in reference to her weakness inability to walk.  ROS: Patient denies fever, rash, sore throat, blurred vision, nausea, vomiting, diarrhea, cough, shortness of breath or chest pain, j  Headache. +anxiety   Objective:  Mr Jeri Cos Wo Contrast  Result Date: 03/08/2018 CLINICAL DATA:  Lumbar decompression L4-5 03/04/2018. Postop bilateral leg weakness, now improved. Rule out stroke. EXAM: MRI HEAD WITHOUT AND WITH CONTRAST TECHNIQUE: Multiplanar, multiecho pulse sequences of the brain and surrounding structures were obtained without and with intravenous contrast. CONTRAST:  65mL MULTIHANCE GADOBENATE DIMEGLUMINE 529 MG/ML IV SOLN COMPARISON:  None. FINDINGS: Brain: Negative for acute infarct. Scattered small white matter hyperintensities bilaterally mostly in the deep white matter. Left frontal periventricular white matter hyperintensity. Brainstem and cerebellum normal. Negative for hemorrhage or mass. Normal enhancement postcontrast administration. Vascular: Normal arterial flow void Skull and upper cervical spine: Negative Sinuses/Orbits: Negative Other: None IMPRESSION: No acute abnormality. Mild chronic white matter changes likely due to chronic microvascular ischemia. Migraine headaches and demyelinating disease are other possibilities however the pattern is not typical for demyelinating disease. Electronically Signed   By: Franchot Gallo M.D.   On: 03/08/2018 09:57   Mr Cervical Spine W Wo Contrast  Result Date: 03/08/2018 CLINICAL DATA:  Lumbar laminectomy L4-5 03/04/2018. Postop bilateral leg weakness. Rule out hematoma or cord compression EXAM: MRI TOTAL SPINE WITHOUT AND WITH CONTRAST TECHNIQUE: Multisequence MR imaging of the spine from the cervical spine to the sacrum was performed prior to and following IV  contrast administration for evaluation of spinal metastatic disease. CONTRAST:  86mL MULTIHANCE GADOBENATE DIMEGLUMINE 529 MG/ML IV SOLN COMPARISON:  Lumbar MRI 02/01/2018 FINDINGS: MRI CERVICAL SPINE FINDINGS Alignment: Normal Vertebrae: Normal bone marrow.  Negative for fracture or mass Cord: Spinal cord signal normal. Enhancing vein in the cord at the C1 level without abnormal signal. Possible normal but prominent cord vascularity. Posterior Fossa, vertebral arteries, paraspinal tissues: Negative Disc levels: C2-3: Mild disc degeneration without stenosis C3-4: Mild disc degeneration. Central and left-sided spurring with moderate left foraminal stenosis due to spurring. C4-5: Disc degeneration. Central disc protrusion and associated spurring with cord flattening and mild spinal stenosis. Neural foramina patent bilaterally. C5-6: Small right paracentral disc protrusion. Diffuse uncinate spurring. Cord flattening with moderate spinal stenosis and mild foraminal narrowing bilaterally. C6-7: Central disc protrusion and spurring. Cord flattening with mild spinal stenosis. C7-T1: Negative MRI THORACIC SPINE FINDINGS Alignment:  Normal Vertebrae: Normal bone marrow. Negative for fracture or mass. Normal enhancement. Cord:  Normal cord signal.  No cord compression or cord lesion. Paraspinal and other soft tissues: Negative Disc levels: Multilevel mild thoracic disc degeneration. Mild disc bulging and disc space narrowing and endplate spurring throughout the thoracic spine from T2 through T12. No significant spinal or foraminal encroachment. No large disc protrusion. MRI LUMBAR SPINE FINDINGS Segmentation:  Normal Alignment:  Normal Vertebrae:  Negative for fracture or mass Conus medullaris: Extends to the L2-3 level and appears normal. Low lying conus medullaris unchanged from the recent MRI. Paraspinal and other soft tissues: Postop fluid posteriorly at L4-5, typical for recent surgery. No mass-effect on the thecal sac.  Disc levels: L1-2: Disc degeneration with disc bulging and Schmorl's node. Negative for stenosis L2-3: Disc degeneration with diffuse disc bulging and endplate spurring. Mild spinal stenosis and mild subarticular stenosis bilaterally unchanged from the  prior study. L3-4: Moderate facet hypertrophy. Mild disc degeneration. Small amount of subdural fluid surrounds the thecal sac contributing to moderate spinal stenosis which has progressed in the interval. L4-5: Extensive bilateral laminectomy without spinal stenosis. Mild subarticular stenosis bilaterally. Shallow left foraminal disc protrusion with mild left foraminal encroachment unchanged from the prior study. Small postoperative fluid collection posterior to the thecal sac without mass-effect on the thecal sac L5-S1: Mild facet degeneration without stenosis. IMPRESSION: Multilevel cervical spondylosis. Mild spinal stenosis C4-5 and moderate spinal stenosis C5-6. Mild spinal stenosis C6-7 Multilevel thoracic degenerative change without significant stenosis or cord compression Multilevel lumbar degenerative change. Mild spinal stenosis L2-3. Moderate spinal stenosis L3-4 has progressed due to small amount of subdural fluid surrounding the thecal sac. Postop bilateral laminectomy L4-5 with wide decompression thecal sac. Typical postoperative fluid posterior to the thecal sac without significant hematoma or compression of the thecal sac. Left foraminal disc protrusion at L4-5 unchanged from the prior study with mild left foraminal narrowing. These results were called by telephone at the time of interpretation on 03/08/2018 at 10:26 am to [name] , who verbally acknowledged these results. Electronically Signed   By: Franchot Gallo M.D.   On: 03/08/2018 10:29   Mr Thoracic Spine W Wo Contrast  Result Date: 03/08/2018 CLINICAL DATA:  Lumbar laminectomy L4-5 03/04/2018. Postop bilateral leg weakness. Rule out hematoma or cord compression EXAM: MRI TOTAL SPINE  WITHOUT AND WITH CONTRAST TECHNIQUE: Multisequence MR imaging of the spine from the cervical spine to the sacrum was performed prior to and following IV contrast administration for evaluation of spinal metastatic disease. CONTRAST:  31mL MULTIHANCE GADOBENATE DIMEGLUMINE 529 MG/ML IV SOLN COMPARISON:  Lumbar MRI 02/01/2018 FINDINGS: MRI CERVICAL SPINE FINDINGS Alignment: Normal Vertebrae: Normal bone marrow.  Negative for fracture or mass Cord: Spinal cord signal normal. Enhancing vein in the cord at the C1 level without abnormal signal. Possible normal but prominent cord vascularity. Posterior Fossa, vertebral arteries, paraspinal tissues: Negative Disc levels: C2-3: Mild disc degeneration without stenosis C3-4: Mild disc degeneration. Central and left-sided spurring with moderate left foraminal stenosis due to spurring. C4-5: Disc degeneration. Central disc protrusion and associated spurring with cord flattening and mild spinal stenosis. Neural foramina patent bilaterally. C5-6: Small right paracentral disc protrusion. Diffuse uncinate spurring. Cord flattening with moderate spinal stenosis and mild foraminal narrowing bilaterally. C6-7: Central disc protrusion and spurring. Cord flattening with mild spinal stenosis. C7-T1: Negative MRI THORACIC SPINE FINDINGS Alignment:  Normal Vertebrae: Normal bone marrow. Negative for fracture or mass. Normal enhancement. Cord:  Normal cord signal.  No cord compression or cord lesion. Paraspinal and other soft tissues: Negative Disc levels: Multilevel mild thoracic disc degeneration. Mild disc bulging and disc space narrowing and endplate spurring throughout the thoracic spine from T2 through T12. No significant spinal or foraminal encroachment. No large disc protrusion. MRI LUMBAR SPINE FINDINGS Segmentation:  Normal Alignment:  Normal Vertebrae:  Negative for fracture or mass Conus medullaris: Extends to the L2-3 level and appears normal. Low lying conus medullaris unchanged  from the recent MRI. Paraspinal and other soft tissues: Postop fluid posteriorly at L4-5, typical for recent surgery. No mass-effect on the thecal sac. Disc levels: L1-2: Disc degeneration with disc bulging and Schmorl's node. Negative for stenosis L2-3: Disc degeneration with diffuse disc bulging and endplate spurring. Mild spinal stenosis and mild subarticular stenosis bilaterally unchanged from the prior study. L3-4: Moderate facet hypertrophy. Mild disc degeneration. Small amount of subdural fluid surrounds the thecal sac contributing to moderate  spinal stenosis which has progressed in the interval. L4-5: Extensive bilateral laminectomy without spinal stenosis. Mild subarticular stenosis bilaterally. Shallow left foraminal disc protrusion with mild left foraminal encroachment unchanged from the prior study. Small postoperative fluid collection posterior to the thecal sac without mass-effect on the thecal sac L5-S1: Mild facet degeneration without stenosis. IMPRESSION: Multilevel cervical spondylosis. Mild spinal stenosis C4-5 and moderate spinal stenosis C5-6. Mild spinal stenosis C6-7 Multilevel thoracic degenerative change without significant stenosis or cord compression Multilevel lumbar degenerative change. Mild spinal stenosis L2-3. Moderate spinal stenosis L3-4 has progressed due to small amount of subdural fluid surrounding the thecal sac. Postop bilateral laminectomy L4-5 with wide decompression thecal sac. Typical postoperative fluid posterior to the thecal sac without significant hematoma or compression of the thecal sac. Left foraminal disc protrusion at L4-5 unchanged from the prior study with mild left foraminal narrowing. These results were called by telephone at the time of interpretation on 03/08/2018 at 10:26 am to [name] , who verbally acknowledged these results. Electronically Signed   By: Franchot Gallo M.D.   On: 03/08/2018 10:29   Mr Lumbar Spine W Wo Contrast  Result Date:  03/08/2018 CLINICAL DATA:  Lumbar laminectomy L4-5 03/04/2018. Postop bilateral leg weakness. Rule out hematoma or cord compression EXAM: MRI TOTAL SPINE WITHOUT AND WITH CONTRAST TECHNIQUE: Multisequence MR imaging of the spine from the cervical spine to the sacrum was performed prior to and following IV contrast administration for evaluation of spinal metastatic disease. CONTRAST:  36mL MULTIHANCE GADOBENATE DIMEGLUMINE 529 MG/ML IV SOLN COMPARISON:  Lumbar MRI 02/01/2018 FINDINGS: MRI CERVICAL SPINE FINDINGS Alignment: Normal Vertebrae: Normal bone marrow.  Negative for fracture or mass Cord: Spinal cord signal normal. Enhancing vein in the cord at the C1 level without abnormal signal. Possible normal but prominent cord vascularity. Posterior Fossa, vertebral arteries, paraspinal tissues: Negative Disc levels: C2-3: Mild disc degeneration without stenosis C3-4: Mild disc degeneration. Central and left-sided spurring with moderate left foraminal stenosis due to spurring. C4-5: Disc degeneration. Central disc protrusion and associated spurring with cord flattening and mild spinal stenosis. Neural foramina patent bilaterally. C5-6: Small right paracentral disc protrusion. Diffuse uncinate spurring. Cord flattening with moderate spinal stenosis and mild foraminal narrowing bilaterally. C6-7: Central disc protrusion and spurring. Cord flattening with mild spinal stenosis. C7-T1: Negative MRI THORACIC SPINE FINDINGS Alignment:  Normal Vertebrae: Normal bone marrow. Negative for fracture or mass. Normal enhancement. Cord:  Normal cord signal.  No cord compression or cord lesion. Paraspinal and other soft tissues: Negative Disc levels: Multilevel mild thoracic disc degeneration. Mild disc bulging and disc space narrowing and endplate spurring throughout the thoracic spine from T2 through T12. No significant spinal or foraminal encroachment. No large disc protrusion. MRI LUMBAR SPINE FINDINGS Segmentation:  Normal  Alignment:  Normal Vertebrae:  Negative for fracture or mass Conus medullaris: Extends to the L2-3 level and appears normal. Low lying conus medullaris unchanged from the recent MRI. Paraspinal and other soft tissues: Postop fluid posteriorly at L4-5, typical for recent surgery. No mass-effect on the thecal sac. Disc levels: L1-2: Disc degeneration with disc bulging and Schmorl's node. Negative for stenosis L2-3: Disc degeneration with diffuse disc bulging and endplate spurring. Mild spinal stenosis and mild subarticular stenosis bilaterally unchanged from the prior study. L3-4: Moderate facet hypertrophy. Mild disc degeneration. Small amount of subdural fluid surrounds the thecal sac contributing to moderate spinal stenosis which has progressed in the interval. L4-5: Extensive bilateral laminectomy without spinal stenosis. Mild subarticular stenosis bilaterally. Shallow left  foraminal disc protrusion with mild left foraminal encroachment unchanged from the prior study. Small postoperative fluid collection posterior to the thecal sac without mass-effect on the thecal sac L5-S1: Mild facet degeneration without stenosis. IMPRESSION: Multilevel cervical spondylosis. Mild spinal stenosis C4-5 and moderate spinal stenosis C5-6. Mild spinal stenosis C6-7 Multilevel thoracic degenerative change without significant stenosis or cord compression Multilevel lumbar degenerative change. Mild spinal stenosis L2-3. Moderate spinal stenosis L3-4 has progressed due to small amount of subdural fluid surrounding the thecal sac. Postop bilateral laminectomy L4-5 with wide decompression thecal sac. Typical postoperative fluid posterior to the thecal sac without significant hematoma or compression of the thecal sac. Left foraminal disc protrusion at L4-5 unchanged from the prior study with mild left foraminal narrowing. These results were called by telephone at the time of interpretation on 03/08/2018 at 10:26 am to [name] , who  verbally acknowledged these results. Electronically Signed   By: Franchot Gallo M.D.   On: 03/08/2018 10:29   Recent Labs    03/09/18 1444  WBC 7.7  HGB 10.0*  HCT 31.8*  PLT 236   Recent Labs    03/10/18 0705  NA 136  K 4.1  CL 96*  GLUCOSE 98  BUN 10  CREATININE 1.05*  CALCIUM 8.5*   CBG (last 3)  No results for input(s): GLUCAP in the last 72 hours.  Wt Readings from Last 3 Encounters:  03/09/18 73.1 kg (161 lb 1.6 oz)  03/04/18 65 kg (143 lb 4.8 oz)  02/23/18 64.6 kg (142 lb 8 oz)     Intake/Output Summary (Last 24 hours) at 03/10/2018 0835 Last data filed at 03/09/2018 1900 Gross per 24 hour  Intake 240 ml  Output -  Net 240 ml    Vital Signs: Blood pressure (!) 114/59, pulse 75, temperature 97.7 F (36.5 C), temperature source Oral, resp. rate 17, height 5\' 5"  (1.651 m), weight 73.1 kg (161 lb 1.6 oz), SpO2 98 %. Physical Exam:  Constitutional: No distress . Vital signs reviewed. HEENT: EOMI, oral membranes moist Neck: supple Cardiovascular: RRR without murmur. No JVD    Respiratory: CTA Bilaterally without wheezes or rales. Normal effort    GI: BS +, non-tender, non-distended  Musculoskeletal: Back tender to palpation.  Neurological: She isalertand oriented to person, place, and time. UE 4/5 prox to distal. LLE 2+ to 3/5 hf, 3/5 ke 4/5 adf/pf. RLE: 1 to 1+/5 hf, ke, adf/pf---effort inconsistent. Pushing back against me during leg lift and with attempts to bend knee. ?similar for ADF/PF. Hoover +. Decreased LT right foot, leg and lateral leg. DTR's trace to absent in LE's. Psychiatric: Pleasant, anxious Skin: incision clean with duoderm dressing    Assessment/Plan: 1. Functional and mobility deficits secondary to multiple lumbar radiculopathies which require 3+ hours per day of interdisciplinary therapy in a comprehensive inpatient rehab setting. Physiatrist is providing close team supervision and 24 hour management of active medical problems  listed below. Physiatrist and rehab team continue to assess barriers to discharge/monitor patient progress toward functional and medical goals.  Function:  Bathing Bathing position      Bathing parts      Bathing assist        Upper Body Dressing/Undressing Upper body dressing   What is the patient wearing?: Hospital gown                Upper body assist Assist Level: More than reasonable time, 2 helpers      Lower Body Dressing/Undressing Lower body dressing  Lower body dressing/undressing activity did not occur: N/A What is the patient wearing?: Hospital Gown, Non-skid slipper socks           Non-skid slipper socks- Performed by helper: Don/doff right sock, Don/doff left sock                  Lower body assist Assist for lower body dressing: 2 Helpers      Toileting Toileting     Toileting steps completed by helper: Adjust clothing prior to toileting, Performs perineal hygiene, Adjust clothing after toileting Toileting Assistive Devices: Grab bar or rail  Toileting assist Assist level: More than reasonable time   Transfers Chair/bed transfer Chair/bed transfer activity did not occur: N/A           Manufacturing systems engineer          Cognition Comprehension Comprehension assist level: Follows basic conversation/direction with no assist  Expression Expression assist level: Expresses basic needs/ideas: With no assist  Social Interaction Social Interaction assist level: Interacts appropriately with others - No medications needed.  Problem Solving Problem solving assist level: Solves basic 75 - 89% of the time/requires cueing 10 - 24% of the time  Memory Memory assist level: Complete Independence: No helper, Recognizes or recalls 90% of the time/requires cueing < 10% of the time, More than reasonable amount of time  Medical Problem List and Plan: 1.Functional and mobility deficitssecondary to lumbar stenosis with  multiple-level radiculopathies s/p Decompression and re-do.  -I suspect a significant non-organic component to RLE weakness as motor exam for RLE is quite inconsistent as noted above. Reassured patient that she should do well and that she needs to do her best to relax with therapies.   -will ask Neuropsych to see pt  -spoke to sister in the room as well re: inconsistent exam. 2. DVT Prophylaxis/Anticoagulation: Pharmaceutical:Lovenox 3. Pain Management:Oxycodoneand robaxin prn effective. On gabapentin tid to help manage neuropathic pain.--fair control at present 4. Mood:LCSW to follow for evaluation and support. 5. Neuropsych: This patientiscapable of making decisions on herown behalf. 6. Skin/Wound Care:Monitor wound for healing. 7. Fluids/Electrolytes/Nutrition:encourage fluids/po  -I personally reviewed the patient's labs today.  normal 8. HTN: Monitor BP bid--continue to coreg bid.   -controlled at present  -follow for activity tolreance 9. ABLA: Post op blood loss--hgb stable yesterday at 10.0 10. Leucocytosis:?steroid effect. WBC's down to 7.7 5/14 11. Constipation:Improved--continue Miralax. 12. Anxiety disorder: Continue ativan prn for now. See above  -provide positive reassurance   LOS (Days) 1 A FACE TO FACE EVALUATION WAS PERFORMED  Meredith Staggers, MD 03/10/2018 8:35 AM

## 2018-03-10 NOTE — Progress Notes (Signed)
Subjective:   POD6 revision microlumbar decompression L4-5 bilateral and wound exploration Patient reports pain as mild.   Seen on behalf of [name] this morning in AM rounds. She states she is depressed that she cannot walk normally or do things for herself due to RLE weakness. She is also c/o some back pain.  No dizziness this AM. No N/V. Afraid she will get constipated again.  Objective: Vital signs in last 24 hours: Temp:  [97.7 F (36.5 C)-98.3 F (36.8 C)] 97.7 F (36.5 C) (05/15 0447) Pulse Rate:  [75-86] 75 (05/15 0447) Resp:  [16-18] 17 (05/15 0447) BP: (114-139)/(59-74) 114/59 (05/15 0447) SpO2:  [97 %-99 %] 98 % (05/15 0447) Weight:  [73.1 kg (161 lb 1.6 oz)] 73.1 kg (161 lb 1.6 oz) (05/14 1900)  Intake/Output from previous day: 05/14 0701 - 05/15 0700 In: 240 [P.O.:240] Out: -  Intake/Output this shift: Total I/O In: 240 [P.O.:240] Out: -   Recent Labs    03/09/18 1444  HGB 10.0*   Recent Labs    03/09/18 1444  WBC 7.7  RBC 3.42*  HCT 31.8*  PLT 236   Recent Labs    03/10/18 0705  NA 136  K 4.1  CL 96*  CO2 31  BUN 10  CREATININE 1.05*  GLUCOSE 98  CALCIUM 8.5*   No results for input(s): LABPT, INR in the last 72 hours.  ABD soft Intact pulses distally Incision: dressing C/D/I and no drainage No cellulitis present Compartment soft Decreased sensation to light touch distal 1/3 anterior lower leg, dorsal aspect of foot Reports numbness plantar aspect of right foot She is unable to actively dorsiflex her R great toe but is able to resist EHL strength testing fairly well States unable to DF her R foot this AM for me. Tight achilles, ankle remains in plantarflexion at rest and can passively only get to neutral. No calf pain or sign of DVT  Assessment/Plan:   Up with therapy MAFO R foot for support  [name] has discussed with Dr. Naaman Plummer today Inconsistent ongoing RLE weakness and dysthesias Possible inorganic component MRI's clear  brain and C/T/L spine CBC yesterday stable Tight heel cords. Per [name], may help to keep pillows under the foot so as to keep her out of a position of such extreme plantarflexion at the ankle. A splint could be helpful but given the numbness in the plantar aspect of the foot would worry about skin breakdown and pressure ulcer etc. Encouraged her about her recovery and positives about her recovery at this point Would recommend continue stool softeners as needed to prevent constipation Discussed with Dr. Mliss Fritz, [place] Rolls. 03/10/2018, 11:44 AM

## 2018-03-10 NOTE — Progress Notes (Signed)
Physical Medicine and Rehabilitation Consult   Reason for Consult: Functional deficits due to L4/5 stenosis and complicated post op course.   Referring Physician: [name].    HPI: [name] is a 57 y.o. female with history of HTN, HA, lumbar microdiskectomy 02/2016, recent falls with back injury. history taken from chart review and patient. 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  She was admitted on 03/04/2018 for foraminotomies L4/5 with lysis of epidural fibrosis and revision of bilateral hemilaminotomies L4/5 by [name].  While in PACU, patient reported loss of sensation from waist down with inability to move BLE. She was taken back to OR  for exploration of wound with evacuation of hematoma and removal of partial spinous process of L4 and L5. Sensory deficits slowly improving LLE. RLE and Dr. Horatio Pel consulted for input questioned symptoms due to nerve root traction and/or post op inflammation form adhesions or "Crack back" from flexion/hyperextension during surgery. MRI brain reviewed, unremarkable for acute intracranial process. MRI cervical, thoracic, lumbar spine ordered for work up showing multilevel spondylosis with mild-moderate stenosis. She states she required assistance with some ADLs including bathing and cooking prior to admission.  Therapy ongoing and MAFO ordered to help with weak DF on right. MD recommending CIR due to functional decline.    Review of Systems  Constitutional: Positive for malaise/fatigue.  Musculoskeletal: Positive for back pain, joint pain and myalgias.  Neurological: Positive for sensory change, focal weakness and weakness.  All other systems reviewed and are negative.         Past Medical History:  Diagnosis Date  . Anxiety   . Back pain    related to spinal stenosis and disc problem, radiates down left buttocks to leg.,  weakness occ.  [name] Headache   . Hypertension   . PONV (postoperative nausea and vomiting)   . Vaginal foreign object    "Uses Femring"         Past Surgical History:  Procedure Laterality Date  . ABDOMINAL HYSTERECTOMY    . CARDIAC CATHETERIZATION N/A 04/18/2015   Procedure: Left Heart Cath and Coronary Angiography;  Surgeon: [name], MD;  Location: [facility];  Service: Cardiovascular;  Laterality: N/A;  . FOOT SURGERY Bilateral    [place] "bunion,bone spur, tendon" (1) -6'16, (1)-10'16  . LUMBAR LAMINECTOMY/DECOMPRESSION MICRODISCECTOMY Bilateral 12/28/2015   Procedure: MICRO LUMBAR DECOMPRESSION L4 - L5 BILATERALLY;  Surgeon: [name], MD;  Location: [facility];  Service: Orthopedics;  Laterality: Bilateral;  . LUMBAR LAMINECTOMY/DECOMPRESSION MICRODISCECTOMY Bilateral 03/04/2018   Procedure: Revision of Microlumbar Decompression Bilateral Lumbar Four-Five;  Surgeon: [name], MD;  Location: [facility];  Service: Orthopedics;  Laterality: Bilateral;  90 mins  . WISDOM TOOTH EXTRACTION    . WOUND EXPLORATION N/A 03/04/2018   Procedure: EXPLORATION OF LUMBAR DECOMPRESSION WOUND;  Surgeon: [name], MD;  Location: [facility];  Service: Orthopedics;  Laterality: N/A;         Family History  Problem Relation Age of Onset  . Lung cancer Father   . Pancreatic cancer Sister   . Breast cancer Sister 2  . Throat cancer Brother   . Cancer Sister   . Multiple myeloma Sister   . Breast cancer Sister        diagnosed in her 50's  . Stomach cancer Cousin   . Colon cancer Neg Hx  Social History:  reports that she has never smoked. She has never used smokeless tobacco. She reports that she does not drink alcohol or use drugs.        Allergies  Allergen Reactions  . Cephalosporins Anaphylaxis  . Penicillins Anaphylaxis and Hives    Has patient had a PCN reaction causing immediate rash, facial/tongue/throat swelling, SOB or  lightheadedness with hypotension: Yes Has patient had a PCN reaction causing severe rash involving mucus membranes or skin necrosis: Yes Has patient had a PCN reaction that required hospitalization Yes Has patient had a PCN reaction occurring within the last 10 years: No If all of the above answers are "NO", then may proceed with Cephalosporin use.   . Anesthetics, Amide Nausea And Vomiting    Does not know name of it. States they put it on record foot center.   [name] Peach [Prunus Persica] Hives  . Latex Hives, Itching and Rash         Medications Prior to Admission  Medication Sig Dispense Refill  . carvedilol (COREG) 12.5 MG tablet Take 12.5 mg by mouth 2 (two) times daily.  5  . LORazepam (ATIVAN) 0.5 MG tablet Take 1 tablet (0.5 mg total) by mouth every 8 (eight) hours. (Patient taking differently: Take 0.5 mg by mouth every 8 (eight) hours as needed for anxiety. ) 15 tablet 0  . PREMARIN 0.625 MG tablet Take 0.625 mg by mouth daily.  7  . [DISCONTINUED] methocarbamol (ROBAXIN) 500 MG tablet Take 500 mg by mouth every 6 (six) hours as needed. For back pain (scheduled in the evening)  1  . [DISCONTINUED] oxyCODONE-acetaminophen (PERCOCET/ROXICET) 5-325 MG tablet Take 1 tablet by mouth every 6 (six) hours as needed for pain.  0  . diphenhydrAMINE (BENADRYL) 25 MG tablet Take 1 tablet (25 mg total) by mouth 3 (three) times daily. Take one tablet three times daily for two days (Patient not taking: Reported on 02/18/2018) 10 tablet 0  . doxycycline (VIBRAMYCIN) 100 MG capsule Take 100 mg by mouth 2 (two) times daily.  2  . EPINEPHrine 0.15 MG/0.15ML IJ injection Inject 0.15 mLs (0.15 mg total) into the muscle as needed for anaphylaxis. (Patient not taking: Reported on 02/18/2018) 2 Device 0  . EPINEPHrine 0.3 mg/0.3 mL IJ SOAJ injection Inject 0.3 mg into the muscle daily as needed for anaphylaxis.  0  . famotidine (PEPCID) 20 MG tablet Take 1 tablet (20 mg total) by mouth 2 (two) times  daily. Take one tablet twice daily for two days (Patient not taking: Reported on 02/18/2018) 10 tablet 0  . predniSONE (DELTASONE) 20 MG tablet Take 2 tablets (40 mg total) by mouth daily with breakfast. For the next four days (Patient not taking: Reported on 02/18/2018) 8 tablet 0    Home: Home Living Family/patient expects to be discharged to:: Private residence Living Arrangements: Spouse/significant other, Children Available Help at Discharge: Family, Available 24 hours/day Type of Home: House Home Access: Stairs to enter [employer] of Steps: 4 Entrance Stairs-Rails: Right, Left Home Layout: One level, Able to live on main level with bedroom/bathroom Bathroom Shower/Tub: Multimedia programmer: Standard Bathroom Accessibility: Yes Home Equipment: Civil engineer, contracting, [place] - single point Additional Comments: spouse pending knee replacement so limited in ability to help with phsycial (A)  Functional History: Prior Function Level of Independence: Independent with assistive device(s) Comments: ambulated with [facility] Functional Status:  Mobility: Bed Mobility Overal bed mobility: Needs Assistance Bed Mobility: Rolling, Sidelying to Sit  Rolling: Supervision Sidelying to sit: Min guard Sit to supine: Mod assist General bed mobility comments: use of bed rail; good demo of log roll technique Transfers Overall transfer level: Needs assistance Equipment used: Rolling walker (2 wheeled) Transfers: Sit to/from Stand Sit to Stand: Min assist, From elevated surface, Min guard Stand pivot transfers: Mod assist General transfer comment: min A initial stand from EOB and min guard from recliner; cues for hand placement Ambulation/Gait Ambulation/Gait assistance: Min assist Ambulation Distance (Feet): (72f X2) Assistive device: Rolling walker (2 wheeled) Gait Pattern/deviations: Step-to pattern, Decreased step length - right, Decreased dorsiflexion - right,  Trunk flexed General Gait Details: mulitmodal cues for posture, sequencing, and weight shifting; with practice pt able to clear rigth foot from floor and increase R hip flexion for improved step length  Gait velocity: decreased  ADL: ADL Overall ADL's : Needs assistance/impaired Eating/Feeding: Set up, Sitting Grooming: Independent Upper Body Bathing: Supervision/ safety Lower Body Bathing: Moderate assistance Upper Body Dressing : Supervision/safety Lower Body Dressing: Moderate assistance Lower Body Dressing Details (indicate cue type and reason): Unable to lift RLE but able to cross L LE over knee for LB dressing.  Toilet Transfer: Moderate assistance Toilet Transfer Details (indicate cue type and reason): Stand-pivot to the L. Continued to encourage OOB for urination or BM General ADL Comments: Per RN and pt MD stated to "go half speed" with therapy. In progress note "up with therapy" is only mention of instructions to therapy. Also reporting that MD is "considering" back brace but pt still OK to chair for the time being. Session focused on LB dressing seated in recliner, applying MAFO, and simulated stnad-pivot toilet transfers. Educated pt concerning need to keep BLE in 90-90 positioning in chair to prevent pressure on her back.   Cognition: Cognition Overall Cognitive Status: Within Functional Limits for tasks assessed Orientation Level: Oriented X4 Cognition Arousal/Alertness: Awake/alert Behavior During Therapy: WFL for tasks assessed/performed Overall Cognitive Status: Within Functional Limits for tasks assessed  Blood pressure 118/61, pulse 82, temperature 97.9 F (36.6 C), temperature source Oral, resp. rate 18, height '5\' 5"'$  (1.651 m), weight 65 kg (143 lb 4.8 oz), SpO2 98 %. Physical Exam  Vitals reviewed. Constitutional: She appears well-developed and well-nourished.  HENT:  Head: Normocephalic and atraumatic.  Eyes: EOM are normal. Right eye exhibits no discharge.  Left eye exhibits no discharge.  Neck: Normal range of motion. Neck supple.  Cardiovascular: Normal rate and regular rhythm.  Respiratory: Effort normal and breath sounds normal.  GI: Soft. Bowel sounds are normal.  Musculoskeletal:  No edema or tenderness in extremities  Neurological: She is alert.  Motor: R upper extremities: 3+-4 -/5 proximal to distal Left lower extremity: Hip flexion, knee extension 3 -/5, ankle dorsiflexion 1/5 Right lower extremity: Hip flexion, knee extension 2/5, ankle dorsiflexion 1/5 Sensation absent to light touch in bilateral feet  Skin: Skin is warm and dry.  Psychiatric: She has a normal mood and affect. Her behavior is normal.   Assessment/Plan: Diagnosis: Lumbar stenosis s/p decompression Labs and images independently reviewed.  Records reviewed and summated above.  1. Does the need for close, 24 hr/day medical supervision in concert with the patient's rehab needs make it unreasonable for this patient to be served in a less intensive setting? Yes  2. Co-Morbidities requiring supervision/potential complications: HTN (monitor and provide prns in accordance with increased physical exertion and pain), HA, leukocytosis (cont to monitor for signs and symptoms of infection, further workup if indicated), ABLA (transfuse if necessary  to ensure appropriate perfusion for increased activity tolerance), post-op pain (Biofeedback training with therapies to help reduce reliance on opiate pain medications, monitor pain control during therapies, and sedation at rest and titrate to maximum efficacy to ensure participation and gains in therapies) 3. Due to safety, skin/wound care, disease management, pain management and patient education, does the patient require 24 hr/day rehab nursing? Yes 4. Does the patient require coordinated care of a physician, rehab nurse, PT (1-2 hrs/day, 5 days/week) and OT (1-2 hrs/day, 5 days/week) to address physical and functional deficits in the  context of the above medical diagnosis(es)? Yes Addressing deficits in the following areas: balance, endurance, locomotion, strength, transferring, bathing, dressing, toileting and psychosocial support 5. Can the patient actively participate in an intensive therapy program of at least 3 hrs of therapy per day at least 5 days per week? Potentially 6. The potential for patient to make measurable gains while on inpatient rehab is good 7. Anticipated functional outcomes upon discharge from inpatient rehab are supervision  with PT, supervision with OT, n/a with SLP. 8. Estimated rehab length of stay to reach the above functional goals is: 12-17 days. 9. Anticipated D/C setting: Home 10. Anticipated post D/C treatments: HH therapy and Home excercise program 11. Overall Rehab/Functional Prognosis: good  RECOMMENDATIONS: This patient's condition is appropriate for continued rehabilitative care in the following setting: Patient reports increasing weakness and sensory deficits. if this indeed is the case, recommend CIR after completion of medical workup. Patient has agreed to participate in recommended program. Yes Note that insurance prior authorization may be required for reimbursement for recommended care.  Comment: Rehab Admissions Coordinator to follow up.   I have personally performed a face to face diagnostic evaluation, including, but not limited to relevant history and physical exam findings, of this patient and developed relevant assessment and plan.  Additionally, I have reviewed and concur with the physician assistant's documentation above.   Delice Lesch, MD, ABPMR Bary Leriche, PA-C 03/08/2018          Revision History                        Routing History

## 2018-03-10 NOTE — Progress Notes (Signed)
Physical Therapy Session Note  Patient Details  Name: [name] MRN: [id number] Date of Birth: 10/25/1961  Today's Date: 03/10/2018 PT Individual Time: 1100-1200 PT Individual Time Calculation (min): 60 min   Short Term Goals: Week 1:  PT Short Term Goal 1 (Week 1): = LTG  Skilled Therapeutic Interventions/Progress Updates:  Pt supine in bed, agreeable to PT. Pt reports 7/10 low back pain, increases in standing. Log-rolling supine to sit with SBA with use of bedrails. Stand pivot transfer bed to w/c with RW and min A for balance. Ambulation 2 x 30 ft with RW and min A for balance, R AFO and shoe cover to assist with RLE advancement. Due to significant R hip flexor weakness pt unable to clear LE during gait. Seated RLE AAROM therex: hip flex with focus on concentric and eccentric control x 10 reps. Manual w/c propulsion x 30 ft with SBA with BUE. Stand pivot transfer back to bed with RW and min A for balance. Sit to supine with min A for RLE assist and v/c for log-rolling technique. Pt left sidelying in bed with needs in reach, bed alarm in place.  Therapy Documentation Precautions:  Precautions Precautions: Fall, Back Precaution Comments: No brace ordered. AFO for R foot drop. Other Brace/Splint: R AFO Restrictions Weight Bearing Restrictions: No  See Function Navigator for Current Functional Status.   Therapy/Group: Individual Therapy  Excell Seltzer, PT, DPT  03/10/2018, 12:14 PM

## 2018-03-10 NOTE — Evaluation (Signed)
Physical Therapy Assessment and Plan  Patient Details  Name: [name] MRN: [id number] Date of Birth: 28-Sep-1961  PT Diagnosis: Abnormality of gait, Difficulty walking, Impaired sensation, Low back pain and Muscle weakness Rehab Potential: Good ELOS: 7-10 days   Today's Date: 03/10/2018 PT Individual Time: 0930-1030 PT Individual Time Calculation (min): 60 min    Problem List:  Patient Active Problem List   Diagnosis Date Noted  . Lumbar radiculopathy 03/09/2018  . Paraparesis ([place]) 03/09/2018  . Essential hypertension   . Chronic nonintractable headache   . Leukocytosis   . Acute blood loss anemia   . Postoperative pain   . Generalized weakness   . Spinal stenosis at L4-L5 level 03/04/2018  . Spinal stenosis of lumbar region 12/28/2015  . Chest pain 04/16/2015    Past Medical History:  Past Medical History:  Diagnosis Date  . Anxiety   . Back pain    related to spinal stenosis and disc problem, radiates down left buttocks to leg., weakness occ.  [name] Headache   . Hypertension   . PONV (postoperative nausea and vomiting)   . Vaginal foreign object    "Uses Femring"   Past Surgical History:  Past Surgical History:  Procedure Laterality Date  . ABDOMINAL HYSTERECTOMY    . CARDIAC CATHETERIZATION N/A 04/18/2015   Procedure: Left Heart Cath and Coronary Angiography;  Surgeon: [name], MD;  Location: [facility];  Service: Cardiovascular;  Laterality: N/A;  . FOOT SURGERY Bilateral    [place] "bunion,bone spur, tendon" (1) -6'16, (1)-10'16  . LUMBAR LAMINECTOMY/DECOMPRESSION MICRODISCECTOMY Bilateral 12/28/2015   Procedure: MICRO LUMBAR DECOMPRESSION L4 - L5 BILATERALLY;  Surgeon: [name], MD;  Location: [facility];  Service: Orthopedics;  Laterality: Bilateral;  . LUMBAR LAMINECTOMY/DECOMPRESSION MICRODISCECTOMY Bilateral 03/04/2018   Procedure: Revision of Microlumbar Decompression Bilateral Lumbar Four-Five;  Surgeon: [name], MD;   Location: [place];  Service: Orthopedics;  Laterality: Bilateral;  90 mins  . WISDOM TOOTH EXTRACTION    . WOUND EXPLORATION N/A 03/04/2018   Procedure: EXPLORATION OF LUMBAR DECOMPRESSION WOUND;  Surgeon: [name], MD;  Location: [place];  Service: Orthopedics;  Laterality: N/A;    Assessment & Plan Clinical Impression: Patient is a 57 y.o. year old female with recent admission to the hospital on 03/04/2018 for foraminotomies L4-5 with lysis of epidural fibrosis and revision of bilateral hemilaminotomies by [name].While in PACU, patient reported loss of sensation from waist down with inability to move bilateral lower extremities. She was taken back to the OR for exploration of wound with evacuation of hematoma and removal of partial spinous processes L4 and L5.     Patient transferred to CIR on 03/09/2018 .   Patient currently requires min with mobility secondary to muscle weakness and decreased standing balance and decreased balance strategies.  Prior to hospitalization, patient was independent  with mobility and lived with Spouse, Family in a House home.  Home access is 4Stairs to enter.  Patient will benefit from skilled PT intervention to maximize safe functional mobility, minimize fall risk and decrease caregiver burden for planned discharge home with intermittent assist.  Anticipate patient will benefit from follow up [facility] at discharge.  PT - End of Session Activity Tolerance: Tolerates 30+ min activity with multiple rests Endurance Deficit: Yes Endurance Deficit Description: limited by pain PT Assessment Rehab Potential (ACUTE/IP ONLY): Good PT Barriers to Discharge: Inaccessible home environment PT Patient demonstrates impairments in the following area(s): Balance;Endurance;Motor;Pain;Sensory PT Transfers Functional Problem(s): Bed  Mobility;Bed to Chair;Car;Furniture PT Locomotion Functional Problem(s): Stairs;Wheelchair Mobility;Ambulation PT Plan PT Intensity: Minimum of 1-2  x/day ,45 to 90 minutes PT Frequency: 5 out of 7 days PT Duration Estimated Length of Stay: 7-10 days PT Treatment/Interventions: Ambulation/gait training;Discharge planning;Functional mobility training;Therapeutic Activities;Balance/vestibular training;Neuromuscular re-education;Therapeutic Exercise;Wheelchair propulsion/positioning;UE/LE Strength taining/ROM;Splinting/orthotics;Pain management;DME/adaptive equipment instruction;Community reintegration;Patient/family education;Stair training;UE/LE Coordination activities PT Transfers Anticipated Outcome(s): mod I PT Locomotion Anticipated Outcome(s): mod I PT Recommendation Follow Up Recommendations: Home health PT Patient destination: Home Equipment Recommended: To be determined  Skilled Therapeutic Intervention Pt participated in skilled PT evaluation and was educated on PT POC and goals.  Pt performs transfers with and without RW with min A.  Gait 25' x 2 with RW with min A due to Rt foot drag, unable to flex Rt hip and knee to swing through.  Stair negotiation 2 stairs with bilat railings with min/mod A with heavy reliance on UEs due to pain, pt uses hip hike due to decreased knee and hip flexion. Simulated car transfer to sedan with min A to get Rt LE into car. Pt then with increased pain. Left in bed sidelying positioned for comfort, needs at hand.  PT Evaluation Precautions/Restrictions Precautions Precautions: Fall;Back Precaution Comments: No brace ordered. AFO for R foot drop. Other Brace/Splint: R AFO Restrictions Weight Bearing Restrictions: No Pain Pain Assessment Pain Scale: 0-10 Pain Score: 5  Faces Pain Scale: Hurts even more Pain Type: Acute pain Pain Location: Back Pain Orientation: Lower Pain Descriptors / Indicators: Aching Pain Frequency: Intermittent Pain Onset: With Activity Patients Stated Pain Goal: 2 Pain Intervention(s): Repositioned;Rest;RN made aware Home Living/Prior Functioning Home  Living Available Help at Discharge: Family;Available 24 hours/day Type of Home: House Home Access: Stairs to enter [employer] of Steps: 4 Entrance Stairs-Rails: Right;Left;Can reach both Home Layout: Able to live on main level with bedroom/bathroom;One level  Lives With: Spouse;Family Prior Function Level of Independence: Independent with basic ADLs;Independent with gait;Independent with transfers  Able to Take Stairs?: Yes Driving: Yes Vocation: Full time employment  Cognition Overall Cognitive Status: Within Functional Limits for tasks assessed Arousal/Alertness: Awake/alert Orientation Level: Oriented X4 Sensation Sensation Light Touch: Impaired Detail Light Touch Impaired Details: Impaired RLE Proprioception: Impaired Detail Proprioception Impaired Details: Impaired RLE Coordination Gross Motor Movements are Fluid and Coordinated: No Coordination and Movement Description: Rt knee held in extension, decreased hip flexion Motor  Motor Motor: Abnormal tone;Abnormal postural alignment and control Motor - Skilled Clinical Observations: increased extensor tone Rt knee   Trunk/Postural Assessment  Cervical Assessment Cervical Assessment: Within Functional Limits Thoracic Assessment Thoracic Assessment: Within Functional Limits Lumbar Assessment Lumbar Assessment: (multiple back surgeries) Postural Control Postural Control: Deficits on evaluation Righting Reactions: delayed due to pain  Balance Dynamic Standing Balance Dynamic Standing - Comments: min A with RW, mod A without device Extremity Assessment      RLE Assessment RLE Assessment: (holds knee in extension, PROM to 90 degrees, hip flex 2-/5, DF 2-/5) LLE Assessment LLE Assessment: (grossly 3/5)   See Function Navigator for Current Functional Status.   Refer to Care Plan for Long Term Goals  Recommendations for other services: None   Discharge Criteria: Patient will be discharged from PT if  patient refuses treatment 3 consecutive times without medical reason, if treatment goals not met, if there is a change in medical status, if patient makes no progress towards goals or if patient is discharged from hospital.  The above assessment, treatment plan, treatment alternatives and goals were discussed and mutually agreed upon: by  patient  Montez Cuda 03/10/2018, 10:32 AM

## 2018-03-11 ENCOUNTER — Inpatient Hospital Stay (HOSPITAL_COMMUNITY): Payer: 59 | Admitting: Physical Therapy

## 2018-03-11 ENCOUNTER — Inpatient Hospital Stay (HOSPITAL_COMMUNITY): Payer: 59 | Admitting: Occupational Therapy

## 2018-03-11 NOTE — Progress Notes (Signed)
Occupational Therapy Session Note  Patient Details  Name: [name] MRN: [id number] Date of Birth: 02/04/1961  Today's Date: 03/11/2018 OT Individual Time: 8110-3159 OT Individual Time Calculation (min): 86 min    Short Term Goals: Week 1:  OT Short Term Goal 1 (Week 1): STGs=LTGs secondary to estimated short LOS  Skilled Therapeutic Interventions/Progress Updates:    Upon entering the pt supine in bed and requesting to shower. Pt ambulating with steady assistance with use of RW into bathroom. Pt seated on toilet for safety while removing clothing items. Pt able to cross L LE into figure four position but needing assistance with R secondary to weakness. OT covered dressing on lower back and pt transferred onto TTB for bathing at shower level. Pt performed lateral leans to clean buttocks with overall close supervision for safety. OT provided pt with LH sponge to increase I with LB bathing. Pt exiting the shower and returning to sit on EOB to don clothing items. OT provided pt with LH reacher to increase I with LB dressing tasks. Pt required min cues throughout session to maintain back precautions. Pt returning to supine with B LE's elevated secondary to increased pain. RN arrived with pain medications at end of session. Pt took multiple rest breaks throughout session secondary to fatigue. Bed alarm activated and call bell within reach upon exiting the room.   Therapy Documentation Precautions:  Precautions Precautions: Fall, Back Precaution Comments: No brace ordered. AFO for R foot drop. Required Braces or Orthoses: Other Brace/Splint Other Brace/Splint: R AFO Restrictions Weight Bearing Restrictions: No General:   Vital Signs:   Pain: Pain Assessment Pain Scale: 0-10 Pain Score: 6  Pain Type: Acute pain Pain Location: Back Pain Orientation: Lower;Mid Pain Descriptors / Indicators: Aching Pain Frequency: Constant Pain Onset: On-going Pain Intervention(s): Medication  (See eMAR)  See Function Navigator for Current Functional Status.   Therapy/Group: Individual Therapy  [name] 03/11/2018, 9:57 AM

## 2018-03-11 NOTE — Progress Notes (Signed)
Physical Therapy Session Note  Patient Details  Name: [name] MRN: [id number] Date of Birth: 16-Jun-1961  Today's Date: 03/11/2018 PT Individual Time: 1300-1415 PT Individual Time Calculation (min): 75 min   Short Term Goals: Week 1:  PT Short Term Goal 1 (Week 1): = LTG  Skilled Therapeutic Interventions/Progress Updates:    Pt received from NT in bathroom, agreeable to PT. Pt reports low back pain 7/10 and requests pain medication at end of session. Pt is able to complete 3/3 toileting steps with setup assist and minA for standing balance with RW. Nustep level 2 x 10 min with BLE only. Sit to supine on wedge with SBA with v/c for log-rolling technique. Pt is able to recall 3/3 back precautions. Supine BLE therex x 10 reps on LLE and x 5 reps on RLE due to onset of back pain with RLE therex, AAROM on RLE. Attempt to have pt perform seated AAROM on RLE with sheet, due to significant hip flexor weakness pt relies heavily on BUE to perform lift and is straining back to perform, exercise deferred. Ambulation x 30 ft with RW and minA for balance, RAFO and shoe cover, focus on increasing BOS with gait as well as step length. Pt continues to exhibit step-to gait pattern as well as just scooting RLE forwards instead of clearing floor, close w/c follow for safety. Assisted pt back to bed at end of session, SBA for bed mobility. Pt left semi-reclined in bed with needs in reach.  Therapy Documentation Precautions:  Precautions Precautions: Fall, Back Precaution Comments: No brace ordered. AFO for R foot drop. Required Braces or Orthoses: Other Brace/Splint Other Brace/Splint: R AFO Restrictions Weight Bearing Restrictions: No  See Function Navigator for Current Functional Status.   Therapy/Group: Individual Therapy   Excell Seltzer, PT, DPT  03/11/2018, 3:55 PM

## 2018-03-11 NOTE — Progress Notes (Signed)
[facility] PHYSICAL MEDICINE & REHABILITATION     PROGRESS NOTE    Subjective/Complaints: Tired this morning. Was pleased with progress yesterday. Feels that she did well with her therapies. Found her visit with Dr. Sima Matas very uplifting.  ROS: Patient denies fever, rash, sore throat, blurred vision, nausea, vomiting, diarrhea, cough, shortness of breath or chest pain,   headache, or mood change.   Objective:  No results found. Recent Labs    03/09/18 1444  WBC 7.7  HGB 10.0*  HCT 31.8*  PLT 236   Recent Labs    03/10/18 0705  NA 136  K 4.1  CL 96*  GLUCOSE 98  BUN 10  CREATININE 1.05*  CALCIUM 8.5*   CBG (last 3)  No results for input(s): GLUCAP in the last 72 hours.  Wt Readings from Last 3 Encounters:  03/09/18 73.1 kg (161 lb 1.6 oz)  03/04/18 65 kg (143 lb 4.8 oz)  02/23/18 64.6 kg (142 lb 8 oz)     Intake/Output Summary (Last 24 hours) at 03/11/2018 0815 Last data filed at 03/10/2018 1700 Gross per 24 hour  Intake 600 ml  Output -  Net 600 ml    Vital Signs: Blood pressure 114/68, pulse 65, temperature 98.2 F (36.8 C), temperature source Oral, resp. rate 18, height 5\' 5"  (1.651 m), weight 73.1 kg (161 lb 1.6 oz), SpO2 96 %. Physical Exam:  Constitutional: No distress . Vital signs reviewed. HEENT: EOMI, oral membranes moist Neck: supple Cardiovascular: RRR without murmur. No JVD    Respiratory: CTA Bilaterally without wheezes or rales. Normal effort    GI: BS +, non-tender, non-distended  Musculoskeletal: Back tender to palpation.  Neurological: She isalertand oriented to person, place, and time. UE 4/5 prox to distal. LLE 2+ to 3/5 hf, 3/5 ke 4/5 adf/pf. RLE: 1 to 1+/5 hf, ke, adf/pf---effort inconsistent. Inconsistent effort on exam of RLE. [place] +. Decreased LT right foot, leg and lateral leg. DTR's remain trace to absent in LE's. Psychiatric: Pleasant, more up beat this morning Skin: incision clean with duoderm  dressing    Assessment/Plan: 1. Functional and mobility deficits secondary to multiple lumbar radiculopathies which require 3+ hours per day of interdisciplinary therapy in a comprehensive inpatient rehab setting. Physiatrist is providing close team supervision and 24 hour management of active medical problems listed below. Physiatrist and rehab team continue to assess barriers to discharge/monitor patient progress toward functional and medical goals.  Function:  Bathing Bathing position Bathing activity did not occur: Refused    Bathing parts      Bathing assist        Upper Body Dressing/Undressing Upper body dressing Upper body dressing/undressing activity did not occur: Refused What is the patient wearing?: Hospital gown                Upper body assist Assist Level: More than reasonable time, 2 helpers      Lower Body Dressing/Undressing Lower body dressing Lower body dressing/undressing activity did not occur: Refused What is the patient wearing?: Hospital Gown, Non-skid slipper socks           Non-skid slipper socks- Performed by helper: Don/doff right sock, Don/doff left sock                  Lower body assist Assist for lower body dressing: 2 Helpers      Toileting Toileting     Toileting steps completed by helper: Adjust clothing prior to toileting, Performs perineal hygiene, Adjust clothing  after toileting Toileting Assistive Devices: Grab bar or rail  Toileting assist Assist level: Touching or steadying assistance (Pt.75%)   Transfers Chair/bed transfer Chair/bed transfer activity did not occur: N/A   Chair/bed transfer assist level: Touching or steadying assistance (Pt > 75%) Chair/bed transfer assistive device: Armrests, Medical sales representative     Max distance: 30' Assist level: Touching or steadying assistance (Pt > 75%)   Wheelchair   Type: Manual Max wheelchair distance: 30' Assist Level: Touching or steadying  assistance (Pt > 75%)  Cognition Comprehension Comprehension assist level: Follows complex conversation/direction with extra time/assistive device  Expression Expression assist level: Expresses complex ideas: With extra time/assistive device  Social Interaction Social Interaction assist level: Interacts appropriately with others with medication or extra time (anti-anxiety, antidepressant).  Problem Solving Problem solving assist level: Solves complex 90% of the time/cues < 10% of the time  Memory Memory assist level: More than reasonable amount of time  Medical Problem List and Plan: 1.Functional and mobility deficitssecondary to lumbar stenosis with multiple-level radiculopathies s/p Decompression and re-do.  -continue positive reinforcement  -pt seems generally motivated.   -assured her that she will not cause more damage to her back by moving.   -she is appreciative of the team's efforts 2. DVT Prophylaxis/Anticoagulation: Pharmaceutical:Lovenox 3. Pain Management:Oxycodoneand robaxin prn effective. On gabapentin tid to help manage neuropathic pain.--fair control at present 4. Mood:LCSW to follow for evaluation and support. 5. Neuropsych: This patientiscapable of making decisions on herown behalf. 6. Skin/Wound Care:Monitor wound for healing. 7. Fluids/Electrolytes/Nutrition:encourage fluids/po 8. HTN: Monitor BP bid--continue to coreg bid.   -controlled at present  -follow for activity tolreance 9. ABLA: Post op blood loss--hgb stable  at 10.0----recheck tomorrow 10. Leucocytosis:?steroid effect. WBC's down to 7.7 5/14 11. Constipation:Improved--continue Miralax. 12. Anxiety disorder: Continue ativan prn for now. See above  -provide positive reassurance  -appreciate Neuropsych help  -don't believe any scheduled psych medication is indicated   LOS (Days) 2 A FACE TO FACE EVALUATION WAS PERFORMED  Meredith Staggers, MD 03/11/2018 8:15 AM

## 2018-03-11 NOTE — Progress Notes (Signed)
Physical Therapy Session Note  Patient Details  Name: [name] MRN: [id number] Date of Birth: [birth year]  Today's Date: 03/11/2018 PT Individual Time: 1130-1200 PT Individual Time Calculation (min): 30 min   Short Term Goals: Week 1:  PT Short Term Goal 1 (Week 1): = LTG  Skilled Therapeutic Interventions/Progress Updates:    Pt semi-reclined in bed, agreeable to PT. Pt reports some pain in low back, not rated. Bed mobility SBA. Stand pivot transfer bed to w/c with min A for balance. Toilet transfer min A with use of RW and grab bar. Pt is able to complete 3/3 toileting steps. Ambulation x 50 ft with RW and min A for balance, RAFO and shoe cover to assist with RLE advancement with gait. Pt exhibits decreased BLE step length and step-to gait pattern. Attempt second bout of ambulation, after 5 ft pt's R knee buckles and she requires max A to maintain standing balance until she can be safely lowered to a chair. Pt reports that her RLE would intermittently buckle prior to surgery and that led to several falls at home. Pt becomes emotional after leg buckles and needs reassurance that she is going to be pushed in therapy and that her body is going to fatigue and that is okay. Also educated pt about swelling in her lumbar spine area following surgery and that even though she had surgery there is still compression on the cord from swelling as well as continued muscle weakness due to the length of time she has been dealing with her back issues. Manual w/c propulsion x 100 ft with SBA with BUE. Pt left seated in recliner in room with needs in reach.  Therapy Documentation Precautions:  Precautions Precautions: Fall, Back Precaution Comments: No brace ordered. AFO for R foot drop. Required Braces or Orthoses: Other Brace/Splint Other Brace/Splint: R AFO Restrictions Weight Bearing Restrictions: No  See Function Navigator for Current Functional Status.   Therapy/Group: Individual  Therapy  Excell Seltzer, PT, DPT  03/11/2018, 3:45 PM

## 2018-03-12 ENCOUNTER — Inpatient Hospital Stay (HOSPITAL_COMMUNITY): Payer: 59 | Admitting: Physical Therapy

## 2018-03-12 ENCOUNTER — Inpatient Hospital Stay (HOSPITAL_COMMUNITY): Payer: 59 | Admitting: Occupational Therapy

## 2018-03-12 ENCOUNTER — Inpatient Hospital Stay (HOSPITAL_COMMUNITY): Payer: 59

## 2018-03-12 LAB — CBC
HEMATOCRIT: 33.7 % — AB (ref 36.0–46.0)
Hemoglobin: 10.7 g/dL — ABNORMAL LOW (ref 12.0–15.0)
MCH: 29.2 pg (ref 26.0–34.0)
MCHC: 31.8 g/dL (ref 30.0–36.0)
MCV: 92.1 fL (ref 78.0–100.0)
Platelets: 286 10*3/uL (ref 150–400)
RBC: 3.66 MIL/uL — ABNORMAL LOW (ref 3.87–5.11)
RDW: 13.2 % (ref 11.5–15.5)
WBC: 5.1 10*3/uL (ref 4.0–10.5)

## 2018-03-12 LAB — BASIC METABOLIC PANEL
Anion gap: 6 (ref 5–15)
BUN: 15 mg/dL (ref 6–20)
CALCIUM: 8.9 mg/dL (ref 8.9–10.3)
CO2: 32 mmol/L (ref 22–32)
Chloride: 102 mmol/L (ref 101–111)
Creatinine, Ser: 1.12 mg/dL — ABNORMAL HIGH (ref 0.44–1.00)
GFR calc Af Amer: >60 mL/min (ref >60)
GFR, EST NON AFRICAN AMERICAN: 54 mL/min — AB (ref >60)
GLUCOSE: 68 mg/dL (ref 65–99)
POTASSIUM: 4.1 mmol/L (ref 3.5–5.1)
Sodium: 140 mmol/L (ref 135–145)

## 2018-03-12 NOTE — Progress Notes (Signed)
Occupational Therapy Session Note  Patient Details  Name: [name] MRN: [id number] Date of Birth: 1961/04/27  Today's Date: 03/12/2018 OT Individual Time: 3545-6256 OT Individual Time Calculation (min): 45 min    Short Term Goals: Week 1:  OT Short Term Goal 1 (Week 1): STGs=LTGs secondary to estimated short LOS  Skilled Therapeutic Interventions/Progress Updates:    Pt received sitting in recliner agreeable to therapy with no c/o pain. Session focused on B UE endurance, pain management strategies, and ADL transfers/standing tolerance. OH vitals taken- with BP 106/64. Pt completed stand pivot transfer to w/c with min A. Pt propelled w/c 23f to elevators and out of hospital. Vc/education provided for navigating around obstacles and in elevators. Outside, discussion with pt and family present re POC, progress, goals, and d/c planning, as well as emotional support provided re personal volition. Pt completed sit to stand transfer and completed functional mobility with close CGA. Following ambulation pt reported 7/10 pain in her L spine, with vc/demo provided for deep breathing as a pain management strategy. Pt returned inside hospital and stopped by lab on 1st floor to visit her coworkers. Pt left in recliner with all needs met.   Therapy Documentation Precautions:  Precautions Precautions: Fall, Back Precaution Comments: No brace ordered. AFO for R foot drop. Required Braces or Orthoses: Other Brace/Splint Other Brace/Splint: R AFO Restrictions Weight Bearing Restrictions: No   Pain: Pain Assessment Pain Scale: 0-10 Pain Score: 0-No pain  See Function Navigator for Current Functional Status.   Therapy/Group: Individual Therapy  SCurtis Sites5/17/2019, 12:39 PM

## 2018-03-12 NOTE — Progress Notes (Signed)
Patient ID: [name], female   DOB: Nov 01, 1960, 57 y.o.   MRN: [id number]   Patient seen. Improving. Reports continued recovery of sensation right leg Now towards ankle. Still with decreased sensation on dorsum and plantar aspect. Motor now with eversion 2/5 DF 3/5 in EHL.  Discussed with Dr. Naaman Plummer. Appreciate his care. Continue Rehab.  Discussed nerve root recovery and duration.

## 2018-03-12 NOTE — Care Management (Signed)
Inpatient [facility] Individual Statement of Services  Patient Name:  [name]  Date:  03/12/2018  Welcome to the [facility].  Our goal is to provide you with an individualized program based on your diagnosis and situation, designed to meet your specific needs.  With this comprehensive rehabilitation program, you will be expected to participate in at least 3 hours of rehabilitation therapies Monday-Friday, with modified therapy programming on the weekends.  Your rehabilitation program will include the following services:  Physical Therapy (PT), Occupational Therapy (OT), 24 hour per day rehabilitation nursing, Therapeutic Recreaction (TR), Neuropsychology, Case Management (Social Worker), Rehabilitation Medicine, Nutrition Services and Pharmacy Services  Weekly team conferences will be held on Tuesdays to discuss your progress.  Your Social Worker will talk with you frequently to get your input and to update you on team discussions.  Team conferences with you and your family in attendance may also be held.  Expected length of stay: 7-10 days    Overall anticipated outcome: modified independent  Depending on your progress and recovery, your program may change. Your Social Worker will coordinate services and will keep you informed of any changes. Your Social Worker's name and contact numbers are listed  below.  The following services may also be recommended but are not provided by the [facility] will be made to provide these services after discharge if needed.  Arrangements include referral to agencies that provide these services.  Your insurance has been verified to be:  [employer] Your primary doctor is:  [place]  Pertinent information will be shared with your doctor and your insurance  company.  Social Worker:  [place], [place] or ([phone]   Information discussed with and copy given to patient by: Lennart Pall, 03/12/2018, 4:36 PM

## 2018-03-12 NOTE — Progress Notes (Signed)
[facility] PHYSICAL MEDICINE & REHABILITATION     PROGRESS NOTE    Subjective/Complaints: Had a good day. Recovered from a "near fall" in therapy and is positive about her prognosis  ROS: Patient denies fever, rash, sore throat, blurred vision, nausea, vomiting, diarrhea, cough, shortness of breath or chest pain, joint or back pain, headache, or mood change.    Objective:  No results found. Recent Labs    03/09/18 1444  WBC 7.7  HGB 10.0*  HCT 31.8*  PLT 236   Recent Labs    03/10/18 0705  NA 136  K 4.1  CL 96*  GLUCOSE 98  BUN 10  CREATININE 1.05*  CALCIUM 8.5*   CBG (last 3)  No results for input(s): GLUCAP in the last 72 hours.  Wt Readings from Last 3 Encounters:  03/09/18 73.1 kg (161 lb 1.6 oz)  03/04/18 65 kg (143 lb 4.8 oz)  02/23/18 64.6 kg (142 lb 8 oz)     Intake/Output Summary (Last 24 hours) at 03/12/2018 0841 Last data filed at 03/11/2018 1700 Gross per 24 hour  Intake 480 ml  Output -  Net 480 ml    Vital Signs: Blood pressure 118/70, pulse 73, temperature 97.7 F (36.5 C), temperature source Oral, resp. rate 14, height 5\' 5"  (1.651 m), weight 73.1 kg (161 lb 1.6 oz), SpO2 96 %. Physical Exam:  Constitutional: No distress . Vital signs reviewed. HEENT: EOMI, oral membranes moist Neck: supple Cardiovascular: RRR without murmur. No JVD    Respiratory: CTA Bilaterally without wheezes or rales. Normal effort    GI: BS +, non-tender, non-distended  Musculoskeletal: Back tender to palpation.  Neurological: She isalertand oriented to person, place, and time. UE 4/5 prox to distal. LLE 2+ to 3/5 hf, 3/5 ke 4/5 adf/pf. RLE: 1 to 1+/5 hf, ke, adf/pf---effort remains inconsistent.  Decreased LT right foot, leg and lateral leg which is somewhat variable also. DTR's remain trace to absent in LE's. Psychiatric: Pleasant and in good spirits. Skin: incision clean with duoderm dressing    Assessment/Plan: 1. Functional and mobility deficits  secondary to multiple lumbar radiculopathies which require 3+ hours per day of interdisciplinary therapy in a comprehensive inpatient rehab setting. Physiatrist is providing close team supervision and 24 hour management of active medical problems listed below. Physiatrist and rehab team continue to assess barriers to discharge/monitor patient progress toward functional and medical goals.  Function:  Bathing Bathing position Bathing activity did not occur: Refused Position: Shower  Bathing parts Body parts bathed by patient: Right arm, Left arm, Chest, Abdomen, Front perineal area, Buttocks, Right upper leg, Right lower leg, Left lower leg Body parts bathed by helper: Back  Bathing assist Assist Level: Supervision or verbal cues, Set up, Assistive device Assistive Device Comment: sponge Set up : To obtain items  Upper Body Dressing/Undressing Upper body dressing Upper body dressing/undressing activity did not occur: Refused What is the patient wearing?: Bra, Pull over shirt/dress Bra - Perfomed by patient: Thread/unthread right bra strap, Thread/unthread left bra strap, Hook/unhook bra (pull down sports bra)   Pull over shirt/dress - Perfomed by patient: Thread/unthread right sleeve, Thread/unthread left sleeve, Put head through opening, Pull shirt over trunk          Upper body assist Assist Level: Set up, Supervision or verbal cues   Set up : To obtain clothing/put away  Lower Body Dressing/Undressing Lower body dressing Lower body dressing/undressing activity did not occur: Refused What is the patient wearing?: Non-skid slipper socks,  Underwear, Pants Underwear - Performed by patient: Thread/unthread right underwear leg, Thread/unthread left underwear leg, Pull underwear up/down   Pants- Performed by patient: Thread/unthread right pants leg, Thread/unthread left pants leg, Pull pants up/down     Non-skid slipper socks- Performed by helper: Don/doff right sock, Don/doff left  sock                  Lower body assist Assist for lower body dressing: Assistive device, Touching or steadying assistance (Pt > 75%) Assistive Device Comment: Engineer, maintenance (IT)   Toileting steps completed by patient: Performs perineal hygiene, Adjust clothing prior to toileting, Adjust clothing after toileting Toileting steps completed by helper: Adjust clothing prior to toileting, Adjust clothing after toileting Toileting Assistive Devices: Grab bar or rail  Toileting assist Assist level: Touching or steadying assistance (Pt.75%)   Transfers Chair/bed transfer Chair/bed transfer activity did not occur: N/A Chair/bed transfer method: Stand pivot Chair/bed transfer assist level: Touching or steadying assistance (Pt > 75%) Chair/bed transfer assistive device: Armrests, Medical sales representative     Max distance: 50' Assist level: Touching or steadying assistance (Pt > 75%)   Wheelchair   Type: Manual Max wheelchair distance: 100' Assist Level: Touching or steadying assistance (Pt > 75%)  Cognition Comprehension Comprehension assist level: Follows complex conversation/direction with extra time/assistive device  Expression Expression assist level: Expresses complex ideas: With extra time/assistive device  Social Interaction Social Interaction assist level: Interacts appropriately with others with medication or extra time (anti-anxiety, antidepressant).  Problem Solving Problem solving assist level: Solves complex 90% of the time/cues < 10% of the time  Memory Memory assist level: More than reasonable amount of time  Medical Problem List and Plan: 1.Functional and mobility deficitssecondary to lumbar stenosis with multiple-level radiculopathies s/p Decompression and re-do. Some of weakness remains non-organic. -continue positive reinforcement  -pt seems  motivated.  2. DVT Prophylaxis/Anticoagulation: Pharmaceutical:Lovenox 3. Pain  Management:Oxycodoneand robaxin prn effective. On gabapentin tid to help manage neuropathic pain.--fair control at present 4. Mood:LCSW to follow for evaluation and support. 5. Neuropsych: This patientiscapable of making decisions on herown behalf. 6. Skin/Wound Care:Monitor wound for healing. 7. Fluids/Electrolytes/Nutrition:encourage fluids/po 8. HTN: Monitor BP bid--continue to coreg bid.   -controlled 5/17  -follow for activity tolreance 9. ABLA: Post op blood loss--hgb stable  at 10.0----ordered follow up labs for today 10. Leucocytosis:?steroid effect. WBC's down to 7.7 5/14 11. Constipation:Improved--continue Miralax. 12. Anxiety disorder: Continue ativan prn for now. See above  -team to continue providing positive reinforcement  -appreciate Neuropsych help  -don't believe any scheduled psych medication is indicated at this time   LOS (Days) [place]  Meredith Staggers, MD 03/12/2018 8:41 AM

## 2018-03-12 NOTE — Progress Notes (Signed)
Physical Therapy Session Note  Patient Details  Name: [name] MRN: [id number] Date of Birth: 1961/09/02  Today's Date: 03/12/2018 PT Individual Time: 1415-1530 PT Individual Time Calculation (min): 75 min   Short Term Goals: Week 1:  PT Short Term Goal 1 (Week 1): = LTG  Skilled Therapeutic Interventions/Progress Updates:    Pt supine in bed, agreeable to PT. Pt reports 3/10 back pain and that she just received pain medication, pt is satisfied with pain management and reports this is the best her pain has been in the past few days. Bed mobility SBA with v/c for log-rolling technique. Stand pivot transfer bed to w/c with RW and min A for balance. Toilet transfer with min A and grab bar. Pt is able to complete 3/3 toileting steps with SBA. Nustep level 3 x 10 min with BLE, one seated rest break at 5 min. Ambulation x 30 ft with RW and min A for balance, close w/c follow, R AFO. Pt demos improved hip flexion and ability to advance RLE with gait this date. Pt continues to exhibit step-to gait pattern and narrow BOS. Attempt second bout of ambulation but pt has onset of dizziness in standing. Seated BP 134/81, standing 120/81. Dizziness subsides with seated rest break. Supine BLE therex x 10 reps with AAROM on RLE. Pt has increase in low back pain with RLE therex. Assisted pt back to bed at end of session, min A. Pt left supine in bed with needs in reach, bed alarm in place.  Therapy Documentation Precautions:  Precautions Precautions: Fall, Back Precaution Comments: No brace ordered. AFO for R foot drop. Required Braces or Orthoses: Other Brace/Splint Other Brace/Splint: R AFO Restrictions Weight Bearing Restrictions: No  See Function Navigator for Current Functional Status.   Therapy/Group: Individual Therapy  Excell Seltzer, PT, DPT  03/12/2018, 4:27 PM

## 2018-03-12 NOTE — Progress Notes (Signed)
Social Work  Social Work Assessment and Plan  Patient Details  Name: [name] MRN: [id number] Date of Birth: 1961-03-11  Today's Date: 03/12/2018  Problem List:  Patient Active Problem List   Diagnosis Date Noted  . Depression with anxiety   . Lumbar radiculopathy 03/09/2018  . Paraparesis ([place]) 03/09/2018  . Essential hypertension   . Chronic nonintractable headache   . Leukocytosis   . Acute blood loss anemia   . Postoperative pain   . Generalized weakness   . Spinal stenosis at L4-L5 level 03/04/2018  . Spinal stenosis of lumbar region 12/28/2015  . Chest pain 04/16/2015   Past Medical History:  Past Medical History:  Diagnosis Date  . Anxiety   . Back pain    related to spinal stenosis and disc problem, radiates down left buttocks to leg., weakness occ.  [name] Headache   . Hypertension   . PONV (postoperative nausea and vomiting)   . Vaginal foreign object    "Uses Femring"   Past Surgical History:  Past Surgical History:  Procedure Laterality Date  . ABDOMINAL HYSTERECTOMY    . CARDIAC CATHETERIZATION N/A 04/18/2015   Procedure: Left Heart Cath and Coronary Angiography;  Surgeon: [name], MD;  Location: [facility];  Service: Cardiovascular;  Laterality: N/A;  . FOOT SURGERY Bilateral    [place] "bunion,bone spur, tendon" (1) -6'16, (1)-10'16  . LUMBAR LAMINECTOMY/DECOMPRESSION MICRODISCECTOMY Bilateral 12/28/2015   Procedure: MICRO LUMBAR DECOMPRESSION L4 - L5 BILATERALLY;  Surgeon: [name], MD;  Location: [facility];  Service: Orthopedics;  Laterality: Bilateral;  . LUMBAR LAMINECTOMY/DECOMPRESSION MICRODISCECTOMY Bilateral 03/04/2018   Procedure: Revision of Microlumbar Decompression Bilateral Lumbar Four-Five;  Surgeon: [name], MD;  Location: [facility];  Service: Orthopedics;  Laterality: Bilateral;  90 mins  . WISDOM TOOTH EXTRACTION    . WOUND EXPLORATION N/A 03/04/2018   Procedure: EXPLORATION OF LUMBAR DECOMPRESSION WOUND;   Surgeon: [name], MD;  Location: [facility];  Service: Orthopedics;  Laterality: N/A;   Social History:  reports that she has never smoked. She has never used smokeless tobacco. She reports that she does not drink alcohol or use drugs.  Family / Support Systems Marital Status: Married Patient Roles: Spouse, Parent Spouse/Significant Other: Nelida Gores @ (C) [phone] Children: adult son, Myleka Moncure (living in the home) @ (C) [phone];  step daughter living in [place]. Anticipated Caregiver: Pandora Leiter, Adanya Sosinski, who works from home  Ability/Limitations of Caregiver: Son works from home, patient reports none Caregiver Availability: 24/7 Family Dynamics: Pt describes both son and spouse is very supportive and denies any concerns about assistance at home upon discharge.  Social History Preferred language: English Religion: Baptist Cultural Background: na Read: Yes Write: Yes Employment Status: Employed Name of Employer: [employer] (works in [employer]) [employer] of Employment: 5(Years) Return to Work Plans: Patient fully intends to return to work when she is medically cleared to do so. Legal Hisotry/Current Legal Issues: None Guardian/Conservator: None - per MD, pt is capable of making decisions on her own behalf   Abuse/Neglect Abuse/Neglect Assessment Can Be Completed: Yes Physical Abuse: Denies Verbal Abuse: Denies Sexual Abuse: Denies Exploitation of patient/patient's resources: Denies Self-Neglect: Denies  Emotional Status Pt's affect, behavior adn adjustment status: Patient sitting up in bed, very pleasant and optimistic about being on rehab in the recovery she hopes to make.  She does admit that she "has a lot of fear about falling" and about any long-term deficits she might continue to have from  the surgery.  She notes that she met with staff neuropsychologist this week and that that was very helpful in "getting some perspective" and would like to continue to see  him while on inpatient. Recent Psychosocial Issues: None Pyschiatric History: None Substance Abuse History: None  Patient / Family Perceptions, Expectations & Goals Pt/Family understanding of illness & functional limitations: Patient and family have a good understanding of surgery that was performed, current functional limitations/need for CIR. Premorbid pt/family roles/activities: Patient was completely independent and full time. Anticipated changes in roles/activities/participation: Little change anticipated to roles if patient able to reach modified independent goals that have been set. Pt/family expectations/goals: "I just want to get this right leg working againLevi Strauss: None Premorbid Home Care/DME Agencies: Other (Comment)([facility] on [address]) Transportation available at discharge: Yes Resource referrals recommended: Neuropsychology  Discharge Planning Living Arrangements: Children Support Systems: Spouse/significant other, Children Type of Residence: Private residence Insurance Resources: Multimedia programmer (specify)([facility]) Financial Resources: Employment Financial Screen Referred: No Living Expenses: Higher education careers adviser Management: Spouse Does the patient have any problems obtaining your medications?: No Home Management: Patient and family share responsibilities. Patient/Family Preliminary Plans: Patient to DC home with spouse and adult son who can provide assistance if needed. Expected length of stay: 7-10 days  Clinical Impression Very pleasant woman here on CIR following lumbar surgery.  Of note, this is her second back surgery, however, she notes deficits are more significant at this time.  She is very motivated for CIR therapies but admits that she is also fearful about potentially having long-term deficits especially in her right leg.  Notes that she is working with unit neuropsychologist and feels this is been helpful working  through her fears.  Good family support and they are able to provide 24-hour assistance if needed.  Social work to follow for support and discharge planning needs. 03/12/2018, 4:29 PM

## 2018-03-12 NOTE — Progress Notes (Signed)
Occupational Therapy Session Note  Patient Details  Name: AVERYANNA SAX MRN: [id number] Date of Birth: Feb 11, 1961  Today's Date: 03/12/2018 OT Individual Time: 5701-7793 and 9030-0923 OT Individual Time Calculation (min): 55 min and 29 min  Short Term Goals: Week 1:  OT Short Term Goal 1 (Week 1): STGs=LTGs secondary to estimated short LOS  Skilled Therapeutic Interventions/Progress Updates:    Pt greeted supine in bed, reporting back pain and nausea but already medicated. Motivated to shower. Tx focus on balance, functional ambulation, and adaptive bathing/dressing skills while adhering to back precautions. Pt completed all functional bathroom transfers using RW at ambulatory level with steady assist. Cues provided to weightshift to Lt for improved Rt foot clearance without AFO. After back was covered, pt bathed while seated, using lateral leans and squat-stands for pericare. LH sponge for feet. She dressed EOB at sit<stand level with RW. Min vcs for adherence to precautions with use of reacher. Mod A for footwear, including Rt AFO. Initiated education regarding footwear AE to increase her functional independence with pt interested to try. OT also modified walker for easy transport of AE and ADL items during future sessions. At end of session pt was left in recliner with all needs within reach.   2nd Session 1:1 tx (29 min) Pt greeted in recliner, reported increased back pain and RN okay'd for OT to provide her with MHP for pain relief. While in room, focused on increasing pts independence with donning footwear. Educated her on techniques for using sock aide, shoe funnel, and shoe horn. She was able to don socks with supervision, but still requires assist to don Rt shoe due to AFO component. She reported feeling encouraged by use of equipment (especially shoe funnel) and continues to benefit from practice. Due to pt reporting feelings of anxiousness/worry about her back, provided her with  lavender essential oil via inhalation to promote feelings of calm/relaxation. She was left with RN to receive pain medication.  Therapy Documentation Precautions:  Precautions Precautions: Fall, Back Precaution Comments: No brace ordered. AFO for R foot drop. Required Braces or Orthoses: Other Brace/Splint Other Brace/Splint: R AFO Restrictions Weight Bearing Restrictions: No ADL:  :    See Function Navigator for Current Functional Status.   Therapy/Group: Individual Therapy  [name] A [name] 03/12/2018, 12:18 PM

## 2018-03-12 NOTE — IPOC Note (Addendum)
Overall Plan of Care [facility]) Patient Details Name: [name] MRN: [id number] DOB: 01-23-1961  Admitting Diagnosis: Lumbar radiculopathy  Hospital Problems: Principal Problem:   Lumbar radiculopathy Active Problems:   Spinal stenosis at L4-L5 level   Postoperative pain   Paraparesis ([facility])   Depression with anxiety     Functional Problem List: Nursing Bowel, Endurance, Medication Management, Pain, Safety, Skin Integrity  PT Balance, Endurance, Motor, Pain, Sensory  OT Balance, Endurance, Pain, Safety  SLP    TR         Basic ADL's: OT Grooming, Bathing, Dressing, Toileting     Advanced  ADL's: OT       Transfers: PT Bed Mobility, Bed to Chair, Car, Manufacturing systems engineer, Metallurgist: PT Stairs, Emergency planning/management officer, Ambulation     Additional Impairments: OT None  SLP        TR      Anticipated Outcomes Item Anticipated Outcome  Self Feeding n/a  Swallowing      Basic self-care  Mod I  Toileting  mod I    Bathroom Transfers mod I - toilet transfer and Supervision - shower transfer  Bowel/Bladder  Min assist   Transfers  mod I  Locomotion  mod I  Communication     Cognition     Pain  <3 on a 0-10 pain scale  Safety/Judgment  min assist   Therapy Plan: PT Intensity: Minimum of 1-2 x/day ,45 to 90 minutes PT Frequency: 5 out of 7 days PT Duration Estimated Length of Stay: 7-10 days OT Intensity: Minimum of 1-2 x/day, 45 to 90 minutes OT Frequency: 5 out of 7 days OT Duration/Estimated Length of Stay: 7-10 days      Team Interventions: Nursing Interventions Patient/Family Education, Skin Care/Wound Management, Discharge Planning, Pain Management, Bowel Management, Medication Management  PT interventions Ambulation/gait training, Discharge planning, Functional mobility training, Therapeutic Activities, Balance/vestibular training, Neuromuscular re-education, Therapeutic Exercise, Wheelchair propulsion/positioning, UE/LE  Strength taining/ROM, Splinting/orthotics, Pain management, DME/adaptive equipment instruction, Community reintegration, Barrister's clerk education, IT trainer, UE/LE Coordination activities  OT Interventions Training and development officer, Self Care/advanced ADL retraining, Therapeutic Exercise, Wheelchair propulsion/positioning, DME/adaptive equipment instruction, UE/LE Strength taining/ROM, Academic librarian, Barrister's clerk education, UE/LE Coordination activities, Discharge planning, Functional mobility training, Therapeutic Activities  SLP Interventions    TR Interventions    SW/CM Interventions Discharge Planning, Psychosocial Support, Patient/Family Education   Barriers to Discharge MD  Behavior  Nursing Medication compliance    PT Inaccessible home environment    OT Other (comments) none known at this time  SLP      SW       Team Discharge Planning: Destination: PT-Home ,OT- Home , SLP-  Projected Follow-up: PT-Home health PT, OT-  24 hour supervision/assistance, Home health OT, SLP-  Projected Equipment Needs: PT-To be determined, OT- To be determined, SLP-  Equipment Details: PT- , OT-  Patient/family involved in discharge planning: PT- Patient,  OT-Patient, SLP-   MD ELOS: 7-10 days Medical Rehab Prognosis:  Excellent Assessment: The patient has been admitted for CIR therapies with the diagnosis of lumbar radiculopathies. The team will be addressing functional mobility, strength, stamina, balance, safety, adaptive techniques and equipment, self-care, bowel and bladder mgt, patient and caregiver education, NMR, orthotics, behavior/ego support, community reentry. Goals have been set at mod I for mobility and self-care.    Meredith Staggers, MD, FAAPMR      See Team Conference Notes for weekly updates to the plan of care

## 2018-03-13 ENCOUNTER — Inpatient Hospital Stay (HOSPITAL_COMMUNITY): Payer: 59 | Admitting: *Deleted

## 2018-03-13 ENCOUNTER — Inpatient Hospital Stay (HOSPITAL_COMMUNITY): Payer: 59 | Admitting: Physical Therapy

## 2018-03-13 NOTE — Progress Notes (Signed)
Physical Therapy Session Note  Patient Details  Name: [name] MRN: [id number] Date of Birth: 1961/01/01  Today's Date: 03/13/2018 PT Individual Time: 6606-3016 PT Individual Time Calculation (min): 45 min   Short Term Goals: Week 1:  PT Short Term Goal 1 (Week 1): = LTG  Skilled Therapeutic Interventions/Progress Updates:   Pt received supine in bed and agreeable to PT at bed level. Pt reports that she is too tired to get out of bed after earlier therapies.  PT instructed pt in BLE NMR to improve ROM, strength, and coordination .  Heel slides x 12  Hip abduction x 10  Hip adduction x10  SAQ x 15 SLRx 10 AAROM AAROM ankle DF/PF. Prolonged DF stretch due to restricted ROM 3 x 1 min  Hip extension with manual resistance x 15   Pt reports increased fatigue once family present and that she will be unable to continue therapy for the rest of the day. PT educated pt in importance of continued physical activity. Pt states that she will resume PT on Monday following her day of rest.      Therapy Documentation Precautions:  Precautions Precautions: Fall, Back Precaution Comments: No brace ordered. AFO for R foot drop. Required Braces or Orthoses: Other Brace/Splint Other Brace/Splint: R AFO Restrictions Weight Bearing Restrictions: No Pain: 2/10 low back  See Function Navigator for Current Functional Status.   Therapy/Group: Individual Therapy  Lorie Phenix 03/13/2018, 5:59 PM

## 2018-03-13 NOTE — Progress Notes (Signed)
Physical Therapy Session Note  Patient Details  Name: [name] MRN: [id number] Date of Birth: [birth year]  Today's Date: 03/13/2018 PT Individual Time: 1002-1102 and 1415-1530 PT Individual Time Calculation (min): 60 min and 57min    Short Term Goals: Week 1:  PT Short Term Goal 1 (Week 1): = LTG  Skilled Therapeutic Interventions/Progress Updates:    Tx focused on functional mobility training, gait with RW, and NMR via forced use, manual facilitation, and multi-modal cues. Pt up in [facility], ready to go.  Seated NMR for RLE: marching, LAQ  2x10,  WC propulsion with buil LEs and UEs assist, cues for reciprocal motion and RLE ext Serial sit<>stands for forced use of RLE with demo and multi-modal cues x5 with Min A Gait training. x8' with RW and close WC follow. Pt limited due to c/o dizziness. Pt needed assist for RLE management for knee flexion, step length, and knee ext support in stance.  Seated BP 124/75 73bpm Standing x30 sec 104/86 80bpm Pt instructed to perform warm-up seated ex prior to satand, always shave chair behind, and continue breathing.   Second tx focused on NMR and gait training with treadmill.  Stand-pivot transfer with Min A and sequence cues Standing tasks: mini-squats (agg LBP to 5/10, modified technique), R hip ABD, R marching  Treadmill training: pt instructed on risk/benefits and agreeable.  1) Vitals prior: 123/68 82bpm Gait training in BWST 53min at 0.3>>0.89mph and 185steps.  Vitals prior: 133/71 and 90bpm No sx of orthosasis.   2) 56min at 0.44mph>>0.63mph at 325 steps Multi-modal cues throughout for gait pattern, posture, and breathing.  Treadmill training notable for significant improvement in pt's R step length, height, and knee flexion/ext as well as activity tolerance and vitals.  Post-treadmill gait on ground x25' with Min A and cues for RLE step length and clearance. Much improved from the morning.   Supine DF stretch 3x30 sec with leg lifter as  HEP.    Therapy Documentation Precautions:  Precautions Precautions: Fall, Back Precaution Comments: No brace ordered. AFO for R foot drop. Required Braces or Orthoses: Other Brace/Splint Other Brace/Splint: R AFO Restrictions Weight Bearing Restrictions: No   Pain: 6/10 for first tx, 5/10 second tx. Modified tx and provided rest, pt premedicated for second tx.    See Function Navigator for Current Functional Status.   Therapy/Group: Individual Therapy  Janin Kozlowski Soundra Pilon, PT, DPT  03/13/2018, 10:45 AM

## 2018-03-13 NOTE — Progress Notes (Signed)
Orthopedic Tech Progress Note Patient Details:  [name] 11/24/1960 [id number]  Ortho Devices Type of Ortho Device: Abdominal binder   Post Interventions Instructions Provided: Care of device   [name] 03/13/2018, 5:17 PM

## 2018-03-13 NOTE — Progress Notes (Signed)
[name] is a 57 y.o. female [birth year] [id number]  Subjective: No new complaints. No new problems. Slept well. Feeling OK.  Objective: Vital signs in last 24 hours: Temp:  [97.5 F (36.4 C)-98.4 F (36.9 C)] 98.4 F (36.9 C) (05/18 1337) Pulse Rate:  [67-79] 79 (05/18 1337) Resp:  [17-18] 18 (05/18 1337) BP: (104-130)/(58-81) 104/65 (05/18 1337) SpO2:  [99 %-100 %] 100 % (05/18 1337) Weight change:  Last BM Date: 03/11/18  Intake/Output from previous day: 05/17 0701 - 05/18 0700 In: 720 [P.O.:720] Out: -  Last cbgs: CBG (last 3)  No results for input(s): GLUCAP in the last 72 hours.   Physical Exam General: No apparent distress.  In a wheelchair HEENT: not dry Lungs: Normal effort. Lungs clear to auscultation, no crackles or wheezes. Cardiovascular: Regular rate and rhythm, no edema Abdomen: S/NT/ND; BS(+) Musculoskeletal:  unchanged Neurological: No new neurological deficits Wounds: clean Skin: clear   Mental state: Alert, oriented, cooperative    Lab Results: BMET    Component Value Date/Time   NA 140 03/12/2018 0853   K 4.1 03/12/2018 0853   CL 102 03/12/2018 0853   CO2 32 03/12/2018 0853   GLUCOSE 68 03/12/2018 0853   BUN 15 03/12/2018 0853   CREATININE 1.12 (H) 03/12/2018 0853   CALCIUM 8.9 03/12/2018 0853   GFRNONAA 54 (L) 03/12/2018 0853   GFRAA >60 03/12/2018 0853   CBC    Component Value Date/Time   WBC 5.1 03/12/2018 0853   RBC 3.66 (L) 03/12/2018 0853   HGB 10.7 (L) 03/12/2018 0853   HCT 33.7 (L) 03/12/2018 0853   PLT 286 03/12/2018 0853   MCV 92.1 03/12/2018 0853   MCH 29.2 03/12/2018 0853   MCHC 31.8 03/12/2018 0853   RDW 13.2 03/12/2018 0853   LYMPHSABS 2.0 03/09/2018 1444   MONOABS 0.7 03/09/2018 1444   EOSABS 0.1 03/09/2018 1444   BASOSABS 0.0 03/09/2018 1444    Studies/Results: No results found.  Medications: I have reviewed the patient's current medications.  Assessment/Plan:    1.  Lumbar stenosis with  multiple level radiculopathies.  Status post decompression and redo.  CIR 2.  DVT prophylaxis with Lovenox 3.  Pain management with oxycodone and Robaxin as needed.  Gabapentin as needed 4.  Mood support 5.  Wound care for healing 6.  Hypertension.  Continue Coreg 7.  Acute blood loss anemia.  Monitor CBC 8.  Leukocytosis.  Monitoring WBC 9.  Constipation.  Management with MiraLAX 10.  Anxiety disorder.  Lorazepam as needed        Length of stay, days: 4  Walker Kehr , MD 03/13/2018, 1:40 PM

## 2018-03-14 MED ORDER — ALPRAZOLAM 0.25 MG PO TABS
0.2500 mg | ORAL_TABLET | Freq: Two times a day (BID) | ORAL | Status: DC | PRN
  Administered 2018-03-14 – 2018-03-19 (×3): 0.25 mg via ORAL
  Filled 2018-03-14 (×4): qty 1

## 2018-03-14 NOTE — Progress Notes (Signed)
[name] is a 57 y.o. female [birth year] [id number]  Subjective: The patient is upset with the news of death in the family this morning  Objective: Vital signs in last 24 hours: Temp:  [97.7 F (36.5 C)-98.3 F (36.8 C)] 97.7 F (36.5 C) (05/19 1359) Pulse Rate:  [75-91] 76 (05/19 1359) Resp:  [17-18] 18 (05/19 1359) BP: (113-142)/(62-80) 130/80 (05/19 1359) SpO2:  [97 %-99 %] 97 % (05/19 1359) Weight change:  Last BM Date: 03/12/18  Intake/Output from previous day: 05/18 0701 - 05/19 0700 In: 480 [P.O.:480] Out: -  Last cbgs: CBG (last 3)  No results for input(s): GLUCAP in the last 72 hours.   Physical Exam General: In emotional distress   HEENT: not dry Lungs: Normal effort. Lungs clear to auscultation, no crackles or wheezes. Cardiovascular: Regular rate and rhythm, no edema Abdomen: S/NT/ND; BS(+) Musculoskeletal:  unchanged Neurological: No new neurological deficits Wounds: healing Skin: clear   Mental state: Alert, oriented, cooperative tearful.    Lab Results: BMET    Component Value Date/Time   NA 140 03/12/2018 0853   K 4.1 03/12/2018 0853   CL 102 03/12/2018 0853   CO2 32 03/12/2018 0853   GLUCOSE 68 03/12/2018 0853   BUN 15 03/12/2018 0853   CREATININE 1.12 (H) 03/12/2018 0853   CALCIUM 8.9 03/12/2018 0853   GFRNONAA 54 (L) 03/12/2018 0853   GFRAA >60 03/12/2018 0853   CBC    Component Value Date/Time   WBC 5.1 03/12/2018 0853   RBC 3.66 (L) 03/12/2018 0853   HGB 10.7 (L) 03/12/2018 0853   HCT 33.7 (L) 03/12/2018 0853   PLT 286 03/12/2018 0853   MCV 92.1 03/12/2018 0853   MCH 29.2 03/12/2018 0853   MCHC 31.8 03/12/2018 0853   RDW 13.2 03/12/2018 0853   LYMPHSABS 2.0 03/09/2018 1444   MONOABS 0.7 03/09/2018 1444   EOSABS 0.1 03/09/2018 1444   BASOSABS 0.0 03/09/2018 1444    Studies/Results: No results found.  Medications: I have reviewed the patient's current medications.  Assessment/Plan:    1.  Lumbar stenosis  with multiple level radiculopathy.  Status post decompression and redo.  CIR 2.  DVT prophylaxis with Lovenox 3.  Pain management with oxycodone and Robaxin as needed.  Gabapentin as needed 4.  Mood support.  The patient learned about the death of the family this morning.  She is upset.  Discussed grief.  Alprazolam low-dose as needed 5.  Wound care for healing 6.  Hypertension.  Coreg twice daily 7.  Blood loss anemia.  Monitoring blood count 8.  Leukocytosis.  Monitoring WBC 9.  Constipation.  MiraLAX as needed 10.  Anxiety-worse due to the above.  Alprazolam low-dose as needed.  Grief discussed        Length of stay, days: [place] , MD 03/14/2018, 2:30 PM

## 2018-03-15 ENCOUNTER — Inpatient Hospital Stay (HOSPITAL_COMMUNITY): Payer: 59 | Admitting: Occupational Therapy

## 2018-03-15 ENCOUNTER — Inpatient Hospital Stay (HOSPITAL_COMMUNITY): Payer: 59 | Admitting: Physical Therapy

## 2018-03-15 LAB — GLUCOSE, CAPILLARY: Glucose-Capillary: 134 mg/dL — ABNORMAL HIGH (ref 65–99)

## 2018-03-15 MED ORDER — CARVEDILOL 6.25 MG PO TABS
6.2500 mg | ORAL_TABLET | Freq: Two times a day (BID) | ORAL | Status: DC
  Administered 2018-03-15 – 2018-03-19 (×8): 6.25 mg via ORAL
  Filled 2018-03-15 (×8): qty 1

## 2018-03-15 NOTE — Progress Notes (Signed)
Physical Therapy Note  Patient Details  Name: [name] MRN: [id number] Date of Birth: October 15, 1961 Today's Date: 03/15/2018    Time: 830-925 55 minutes  1:1 Pt c/o 4/10 pain in low back, requests to wait until after session to receive pain meds.  Pt performs w/c mobility in controlled environments with supervision for strengthening.  Sit to stand repetitions with focus on technique to emphasize Rt LE strengthening with min A with RW.  Gait with close w/c follow and min A with RW 2 x 25'. Pt still unable to lift Rt LE and must drag foot despite use of AFO.  Stair negotiation x 4 stairs with bilat handrails with min A, increased time and cues for technique.  Nustep for LE strengthening and Rt LE AAROM x 6 minutes level 4.  Standing tolerance and balance with wt shifts and reaching with min A, tactile cues for Rt hip and glute activation in stance phase.  Pt left in room with needs at hand.  DONAWERTH,KAREN 03/15/2018, 9:27 AM

## 2018-03-15 NOTE — Progress Notes (Addendum)
Occupational Therapy Session Note  Patient Details  Name: [name] MRN: [id number] Date of Birth: 12-01-1960  Today's Date: 03/15/2018 OT Individual Time: 7026-3785 and 1300-1343 OT Individual Time Calculation (min): 88 min and 43 min  Short Term Goals: Week 1:  OT Short Term Goal 1 (Week 1): STGs=LTGs secondary to estimated short LOS  Skilled Therapeutic Interventions/Progress Updates:    Pt greeted in w/c. Reported that she was mourning her late 38. Hoping to go home this week to attend her funeral on Friday. OT provided therapeutic listening and support to enhance psychosocial wellbeing. Discussed her OT goals and started d/c planning. Focused tx on IADL retraining/education for return to meaningful life roles at home. Pt practiced bedmaking first, ambulating around bed using RW and steady assist. The bed was elevated to simulate hers at home. Abdominal binder donned today with reported increased back comfort. She required cues to side-step with RW and face task vs twisting. Also cued her to squat low when tucking in covers and applying fitted sheets vs. bending. She reported this helped with back pain. Walker and reacher bags used to [employer]. After she completed toilet transfer/toileting with steady assist at ambulatory level with device, OT escorted her to therapy kitchen via w/c. Pt engaged in simulated simple meal prep tasks per routine at home. Problem solved environmental challenges together and came up with multiple solutions. She used her walker bag to transport kitchen items and reacher to lift light-weighted pans in low cabinets. At end of tx pt was returned to room, solidified DME needs and f/u therapies. She was left with all needs at end of tx.    2nd Session 1:1 tx (43 min) Pt greeted supine in bed, amenable to tx. Stand pivot<w/c completed with steady assist, and then OT escorted her to gift shop. Worked on functional mobility in community setting. Pt  ambulated to look at clothes, food, and balloons with steady assist and RW in busy environment. Cues provided for improving Rt foot clearance. Due to pt report of going out to eat frequently with family, practiced transfers to booth and Chiropodist in food court area. Pt able to complete these transfers at ambulatory level with steady assist. Cues required for technique and for maintaining precautions. Afterwards pt was returned to room and completed stand pivot back to bed. She was repositioned for comfort and left with all needs.   Therapy Documentation Precautions:  Precautions Precautions: Fall, Back Precaution Comments: No brace ordered. AFO for R foot drop. Required Braces or Orthoses: Other Brace/Splint Other Brace/Splint: R AFO Restrictions Weight Bearing Restrictions: No Vital Signs: Therapy Vitals Temp: 97.6 F (36.4 C) Temp Source: Oral Pulse Rate: 85 Resp: 16 BP: 132/76 Patient Position (if appropriate): Lying Oxygen Therapy SpO2: 99 % O2 Device: Room Air Pain: Pain Assessment Pain Score: 7  Pain Type: Surgical pain Pain Location: Back Pain Orientation: Right Pain Descriptors / Indicators: Aching;Discomfort Pain Intervention(s): Medication (See eMAR) ADL:      See Function Navigator for Current Functional Status.   Therapy/Group: Individual Therapy  Malahki Gasaway A Domonique Brouillard 03/15/2018, 12:47 PM

## 2018-03-15 NOTE — Plan of Care (Signed)
Met 03/15/2018

## 2018-03-15 NOTE — Plan of Care (Signed)
LTGs adjusted due to earlier than anticipated d/c. Pt will have 24/7 supervision-Min A from family at time of d/c

## 2018-03-15 NOTE — Progress Notes (Signed)
[facility] PHYSICAL MEDICINE & REHABILITATION     PROGRESS NOTE    Subjective/Complaints: Upset this morning over loss of godmother with whom she was close. "she promised she would be there for me when I got home.".   ROS: Patient denies fever, rash, sore throat, blurred vision, nausea, vomiting, diarrhea, cough, shortness of breath or chest pain, headache .   Objective:  No results found. No results for input(s): WBC, HGB, HCT, PLT in the last 72 hours. No results for input(s): NA, K, CL, GLUCOSE, BUN, CREATININE, CALCIUM in the last 72 hours.  Invalid input(s): CO CBG (last 3)  No results for input(s): GLUCAP in the last 72 hours.  Wt Readings from Last 3 Encounters:  03/09/18 73.1 kg (161 lb 1.6 oz)  03/04/18 65 kg (143 lb 4.8 oz)  02/23/18 64.6 kg (142 lb 8 oz)     Intake/Output Summary (Last 24 hours) at 03/15/2018 0853 Last data filed at 03/14/2018 2300 Gross per 24 hour  Intake 720 ml  Output 1 ml  Net 719 ml    Vital Signs: Blood pressure 124/70, pulse 79, temperature 98.3 F (36.8 C), temperature source Oral, resp. rate 16, height 5\' 5"  (1.651 m), weight 73.1 kg (161 lb 1.6 oz), SpO2 100 %. Physical Exam:  Constitutional: No distress . Vital signs reviewed. HEENT: EOMI, oral membranes moist Neck: supple Cardiovascular: RRR without murmur. No JVD    Respiratory: CTA Bilaterally without wheezes or rales. Normal effort    GI: BS +, non-tender, non-distended  Musculoskeletal: Back tender to palpation.  Neurological: She isalertand oriented to person, place, and time. UE 4/5 prox to distal. LLE 2+ to 3/5 hf, 3/5 ke 4/5 adf/pf. RLE: 1 to 1+/5 hf, ke, adf/pf---effort still inconsistent. +Hoovers.  Decreased LT right foot, leg and lateral leg which is somewhat variable also. DTR's remain trace to absent in LE's. Psychiatric:tearful/anxious at times, otherwise appropriate and cooperative Skin: incision clean with duoderm dressing    Assessment/Plan: 1.  Functional and mobility deficits secondary to multiple lumbar radiculopathies which require 3+ hours per day of interdisciplinary therapy in a comprehensive inpatient rehab setting. Physiatrist is providing close team supervision and 24 hour management of active medical problems listed below. Physiatrist and rehab team continue to assess barriers to discharge/monitor patient progress toward functional and medical goals.  Function:  Bathing Bathing position Bathing activity did not occur: Refused Position: Wheelchair/chair at sink  Bathing parts Body parts bathed by patient: Right arm, Left arm, Chest, Abdomen, Left lower leg, Right lower leg, Front perineal area, Right upper leg, Left upper leg, Buttocks Body parts bathed by helper: Back  Bathing assist Assist Level: Supervision or verbal cues Assistive Device Comment: LH sponge Set up : To obtain items  Upper Body Dressing/Undressing Upper body dressing Upper body dressing/undressing activity did not occur: Refused What is the patient wearing?: Bra, Pull over shirt/dress Bra - Perfomed by patient: Thread/unthread right bra strap, Thread/unthread left bra strap, Hook/unhook bra (pull down sports bra)   Pull over shirt/dress - Perfomed by patient: Thread/unthread right sleeve, Put head through opening, Thread/unthread left sleeve, Pull shirt over trunk          Upper body assist Assist Level: Set up   Set up : To obtain clothing/put away  Lower Body Dressing/Undressing Lower body dressing Lower body dressing/undressing activity did not occur: Refused What is the patient wearing?: Underwear, Pants Underwear - Performed by patient: Thread/unthread right underwear leg, Thread/unthread left underwear leg, Pull underwear up/down  Pants- Performed by patient: Thread/unthread right pants leg, Thread/unthread left pants leg, Pull pants up/down   Non-skid slipper socks- Performed by patient: Don/doff right sock, Don/doff left sock Non-skid  slipper socks- Performed by helper: Don/doff right sock, Don/doff left sock     Shoes - Performed by patient: Don/doff left shoe Shoes - Performed by helper: Don/doff left shoe, Fasten right, Fasten left   AFO - Performed by helper: Don/doff right AFO   TED Hose - Performed by helper: Don/doff right TED hose, Don/doff left TED hose  Lower body assist Assist for lower body dressing: Assistive device, Supervision or verbal cues Assistive Device Comment: Sock aide + shoe funnel    Toileting Toileting   Toileting steps completed by patient: Performs perineal hygiene, Adjust clothing after toileting, Adjust clothing prior to toileting Toileting steps completed by helper: Adjust clothing prior to toileting, Adjust clothing after toileting Toileting Assistive Devices: Grab bar or rail  Toileting assist Assist level: Supervision or verbal cues   Transfers Chair/bed transfer   Chair/bed transfer method: Stand pivot Chair/bed transfer assist level: Touching or steadying assistance (Pt > 75%) Chair/bed transfer assistive device: Armrests, Medical sales representative     Max distance: 25 Assist level: Touching or steadying assistance (Pt > 75%)   Wheelchair   Type: Manual Max wheelchair distance: 100' Assist Level: Touching or steadying assistance (Pt > 75%)  Cognition Comprehension Comprehension assist level: Follows complex conversation/direction with extra time/assistive device  Expression Expression assist level: Expresses complex ideas: With extra time/assistive device  Social Interaction Social Interaction assist level: Interacts appropriately with others with medication or extra time (anti-anxiety, antidepressant).  Problem Solving Problem solving assist level: Solves complex 90% of the time/cues < 10% of the time  Memory Memory assist level: More than reasonable amount of time  Medical Problem List and Plan: 1.Functional and mobility deficitssecondary to lumbar stenosis  with multiple-level radiculopathies s/p Decompression and re-do. Some of weakness remains non-organic. continue therapies.  -  -pt would like to go to funeral which is Friday. I explained to her that the [facility] may set up where she could discharge by the end of the week. She will discuss with other team members.  2. DVT Prophylaxis/Anticoagulation: Pharmaceutical:Lovenox 3. Pain Management:Oxycodoneand robaxin prn effective. On gabapentin tid to help manage neuropathic pain.--fair control at present 4. Mood:provided + reinforcement.  5. Neuropsych: This patientiscapable of making decisions on herown behalf. 6. Skin/Wound Care:Monitor wound for healing. 7. Fluids/Electrolytes/Nutrition:encourage fluids/po 8. HTN: Monitor BP bid--continue to coreg bid.   -controlled 5/20  -binder to prevent orthostasis   -reduce coreg to 6.25mg  bid  -encourage fluids 9. ABLA: Post op blood loss--hgb stable  at 10.7 10. Leucocytosis:?steroid effect. WBC's down to 5.1 11. Constipation:Improved--continue Miralax. 12. Anxiety disorder: Continue ativan prn for now. See above  -team to continue providing positive reinforcement  -discussed her godmother's loss. Stated that her godmother would likely want her to progress as much as she physically/functionally can. Can use that as motivation to meet discharge goals this week.   Greater than 50% of time during this encounter was spent counseling patient/family in regard to grieving, dc planning, etc.  .   LOS (Days) [place]  Meredith Staggers, MD 03/15/2018 8:53 AM

## 2018-03-16 ENCOUNTER — Inpatient Hospital Stay (HOSPITAL_COMMUNITY): Payer: 59 | Admitting: Physical Therapy

## 2018-03-16 ENCOUNTER — Inpatient Hospital Stay (HOSPITAL_COMMUNITY): Payer: 59 | Admitting: Occupational Therapy

## 2018-03-16 LAB — GLUCOSE, CAPILLARY: GLUCOSE-CAPILLARY: 91 mg/dL (ref 65–99)

## 2018-03-16 NOTE — Progress Notes (Signed)
Occupational Therapy Session Note  Patient Details  Name: KHYLEI WILMS MRN: [id number] Date of Birth: [birth year]  Today's Date: 03/16/2018 OT Individual Time: 9326-7124 OT Individual Time Calculation (min): 74 min    Short Term Goals: Week 1:  OT Short Term Goal 1 (Week 1): STGs=LTGs secondary to estimated short LOS  Skilled Therapeutic Interventions/Progress Updates:    Upon entering the room, pt seated on EOB awaiting therapist and requesting to use bathroom. Pt ambulating with RW and steady assistance to bathroom. Pt performed clothing management and hygiene with close supervision for safety. Pt exiting the bathroom and utilized reacher to obtain clothing items from [employer] with supervision. Pt transferred onto TTB with steady assistance for safety. Pt utilized reacher to [employer] while maintaining back precautions. Pt seated on TTB for bathing with use of lateral leans and LH sponge to increase independence with self care. Pt returned to sit on EOB for dressing tasks with min A needed to don R shoe and AFO. Pt transferred into wheelchair and seated at sink for grooming tasks at mod I level. Pt remained in wheelchair at end of session with call bell and all needed items within reach upon exiting the room.   Therapy Documentation Precautions:  Precautions Precautions: Fall, Back Precaution Comments: No brace ordered. AFO for R foot drop. Required Braces or Orthoses: Other Brace/Splint Other Brace/Splint: R AFO Restrictions Weight Bearing Restrictions: No General:    See Function Navigator for Current Functional Status.   Therapy/Group: Individual Therapy  [name] 03/16/2018, 12:48 PM

## 2018-03-16 NOTE — Progress Notes (Signed)
Physical Therapy Session Note  Patient Details  Name: [name] MRN: [id number] Date of Birth: 1961/07/18  Today's Date: 03/16/2018 PT Individual Time: 1435-1459 PT Individual Time Calculation (min): 24 min   Short Term Goals: Week 1:  PT Short Term Goal 1 (Week 1): = LTG  Skilled Therapeutic Interventions/Progress Updates:   Pt received in w/c from other therapist, agreeable to therapy and denies pain. Session focused on endurance w/ functional mobility and NMR. Pt self-propelled w/c to therapy gym w/ supervision to work on UE strengthening. Performed NuStep 10 min @ level 4 for LE strengthening in reciprocal movement pattern. Returned to room and ended session sitting EOB and in care of NT, all needs met.   Therapy Documentation Precautions:  Precautions Precautions: Fall, Back Precaution Comments: No brace ordered. AFO for R foot drop. Required Braces or Orthoses: Other Brace/Splint Other Brace/Splint: R AFO Restrictions Weight Bearing Restrictions: No Vital Signs: Therapy Vitals Temp: 98.2 F (36.8 C) Temp Source: Oral Pulse Rate: 79 Resp: 17 BP: 129/85 Patient Position (if appropriate): Sitting Oxygen Therapy SpO2: 99 % O2 Device: Room Air  See Function Navigator for Current Functional Status.   Therapy/Group: Individual Therapy  Janes Colegrove K Arnette 03/16/2018, 3:00 PM

## 2018-03-16 NOTE — Progress Notes (Signed)
[facility] PHYSICAL MEDICINE & REHABILITATION     PROGRESS NOTE    Subjective/Complaints: In better spirits today. Other than taking her oxycodone on an empty stomach, she did well yesterday.   ROS: Patient denies fever, rash, sore throat, blurred vision, nausea, vomiting, diarrhea, cough, shortness of breath or chest pain, joint or back pain, headache, or mood change.   Objective:  No results found. No results for input(s): WBC, HGB, HCT, PLT in the last 72 hours. No results for input(s): NA, K, CL, GLUCOSE, BUN, CREATININE, CALCIUM in the last 72 hours.  Invalid input(s): CO CBG (last 3)  Recent Labs    03/15/18 2229 03/16/18 0632  GLUCAP 134* 91    Wt Readings from Last 3 Encounters:  03/09/18 73.1 kg (161 lb 1.6 oz)  03/04/18 65 kg (143 lb 4.8 oz)  02/23/18 64.6 kg (142 lb 8 oz)     Intake/Output Summary (Last 24 hours) at 03/16/2018 0840 Last data filed at 03/15/2018 1700 Gross per 24 hour  Intake 720 ml  Output -  Net 720 ml    Vital Signs: Blood pressure 117/74, pulse 90, temperature 98 F (36.7 C), resp. rate 16, height 5\' 5"  (1.651 m), weight 73.1 kg (161 lb 1.6 oz), SpO2 99 %. Physical Exam:  Constitutional: No distress . Vital signs reviewed. HEENT: EOMI, oral membranes moist Neck: supple Cardiovascular: RRR without murmur. No JVD    Respiratory: CTA Bilaterally without wheezes or rales. Normal effort    GI: BS +, non-tender, non-distended   Musculoskeletal: Back tender.  Neurological: She isalertand oriented to person, place, and time. UE 4/5 prox to distal. LLE 2+ to 3/5 hf, 3/5 ke 4/5 adf/pf. RLE: 1 to 1+/5 hf, ke, adf/pf---effort still inconsistent. +Hoovers.  Decreased LT right foot, leg and lateral leg which remains somewhat variable. DTR's remain trace to absent in LE's. Psychiatric:pleasant and smiling. Skin: incision clean and dry.    Assessment/Plan: 1. Functional and mobility deficits secondary to multiple lumbar radiculopathies  which require 3+ hours per day of interdisciplinary therapy in a comprehensive inpatient rehab setting. Physiatrist is providing close team supervision and 24 hour management of active medical problems listed below. Physiatrist and rehab team continue to assess barriers to discharge/monitor patient progress toward functional and medical goals.  Function:  Bathing Bathing position Bathing activity did not occur: Refused Position: Wheelchair/chair at sink  Bathing parts Body parts bathed by patient: Right arm, Left arm, Chest, Abdomen, Left lower leg, Right lower leg, Front perineal area, Right upper leg, Left upper leg, Buttocks Body parts bathed by helper: Back  Bathing assist Assist Level: Supervision or verbal cues Assistive Device Comment: LH sponge Set up : To obtain items  Upper Body Dressing/Undressing Upper body dressing Upper body dressing/undressing activity did not occur: Refused What is the patient wearing?: Bra, Pull over shirt/dress Bra - Perfomed by patient: Thread/unthread right bra strap, Thread/unthread left bra strap, Hook/unhook bra (pull down sports bra)   Pull over shirt/dress - Perfomed by patient: Thread/unthread right sleeve, Put head through opening, Thread/unthread left sleeve, Pull shirt over trunk          Upper body assist Assist Level: Set up   Set up : To obtain clothing/put away  Lower Body Dressing/Undressing Lower body dressing Lower body dressing/undressing activity did not occur: Refused What is the patient wearing?: Underwear, Pants Underwear - Performed by patient: Thread/unthread right underwear leg, Thread/unthread left underwear leg, Pull underwear up/down   Pants- Performed by patient: Thread/unthread right  pants leg, Thread/unthread left pants leg, Pull pants up/down   Non-skid slipper socks- Performed by patient: Don/doff right sock, Don/doff left sock Non-skid slipper socks- Performed by helper: Don/doff right sock, Don/doff left sock      Shoes - Performed by patient: Don/doff left shoe Shoes - Performed by helper: Don/doff left shoe, Fasten right, Fasten left   AFO - Performed by helper: Don/doff right AFO   TED Hose - Performed by helper: Don/doff right TED hose, Don/doff left TED hose  Lower body assist Assist for lower body dressing: Assistive device, Supervision or verbal cues Assistive Device Comment: Sock aide + shoe funnel    Toileting Toileting   Toileting steps completed by patient: Performs perineal hygiene, Adjust clothing after toileting, Adjust clothing prior to toileting Toileting steps completed by helper: Adjust clothing prior to toileting, Adjust clothing after toileting Toileting Assistive Devices: Grab bar or rail  Toileting assist Assist level: Supervision or verbal cues   Transfers Chair/bed transfer   Chair/bed transfer method: Stand pivot Chair/bed transfer assist level: Touching or steadying assistance (Pt > 75%) Chair/bed transfer assistive device: Armrests, Medical sales representative     Max distance: 25 Assist level: Touching or steadying assistance (Pt > 75%)   Wheelchair   Type: Manual Max wheelchair distance: 100' Assist Level: Touching or steadying assistance (Pt > 75%)  Cognition Comprehension Comprehension assist level: Follows complex conversation/direction with extra time/assistive device  Expression Expression assist level: Expresses complex ideas: With extra time/assistive device  Social Interaction Social Interaction assist level: Interacts appropriately with others with medication or extra time (anti-anxiety, antidepressant).  Problem Solving Problem solving assist level: Solves complex 90% of the time/cues < 10% of the time  Memory Memory assist level: More than reasonable amount of time  Medical Problem List and Plan: 1.Functional and mobility deficitssecondary to lumbar stenosis with multiple-level radiculopathies s/p Decompression and re-do. Some of  weakness remains non-organic. continue therapies.  -  -pt would like to go to funeral which is Friday. I explained to her that the [facility] may set up where she could discharge by the end of the week. Discuss in team conference today 2. DVT Prophylaxis/Anticoagulation: Pharmaceutical:Lovenox 3. Pain Management:Oxycodoneand robaxin prn effective. On gabapentin tid to help manage neuropathic pain.--fair control at present 4. Mood:provided + reinforcement.  5. Neuropsych: This patientiscapable of making decisions on herown behalf. 6. Skin/Wound Care:Monitor wound for healing. 7. Fluids/Electrolytes/Nutrition:encourage fluids/po 8. HTN: Monitor BP bid--continue to coreg bid.   -controlled 5/20  -binder to prevent orthostasis   -reduced coreg to 6.25mg  bid  -encourage fluids 9. ABLA: Post op blood loss--hgb stable  at 10.7 10. Leucocytosis:?steroid effect. WBC's down to 5.1 11. Constipation:Improved--continue Miralax. 12. Anxiety disorder: Continue ativan prn for now. See above  -team to continue providing positive reinforcement  -in better spirits today     .   LOS (Days) [place] EVALUATION WAS PERFORMED  Meredith Staggers, MD 03/16/2018 8:40 AM

## 2018-03-16 NOTE — Progress Notes (Addendum)
Physical Therapy Note  Patient Details  Name: [name] MRN: [id number] Date of Birth: 04-24-1961 Today's Date: 03/16/2018    Time: 1100-1130 30 minutes  1:1 Pt with no c/o pain during session. Session focused on gait in home and controlled environments with obstacle negotiation and tight spaces with pt able to gait up to 24' with steadying assist with RW.  Pt continues with decreased Rt foot clearance but is able to compensate with UE strength.  Pt left in room with needs at hand.  Time: 1300-1330 30 minutes  1:1 Pt with no c/o pain.  Gait in home and controlled environments 40', 73' with RW and steadying assist.  Toilet transfers with use of grab bar with supervision including clothing management and hygiene.  Nustep x 6 minutes for UE/LE strength and endurance.  Pt left in room with needs at hand.   [place] Stell 03/16/2018, 11:34 AM

## 2018-03-16 NOTE — Progress Notes (Signed)
Physical Therapy Session Note  Patient Details  Name: [name] MRN: [id number] Date of Birth: 12-19-1960  Today's Date: 03/16/2018 PT Individual Time: 1350-1435 PT Individual Time Calculation (min): 45 min   Short Term Goals: Week 1:  PT Short Term Goal 1 (Week 1): = LTG  Skilled Therapeutic Interventions/Progress Updates: Pt presented in w/c agreeable to therapy. Pt transported to rehab gym and ascend/descend x 4 steps with bilateral rails and overall min guard.  Pt noted to use QL to hike hip vs circumduction. Pt was able to clear RLE onto step. On second attempt during descent pt became lightheaded and noted L knee buckling. Pt able to recover and complete descent of stairs with min/modA. Pt then participated in standing tolerance activities including placing horseshoes on hoop and forced use RLE to target. Participated in gait training x 63ft with minA fading to min guard with cues for attempting hip/knee flexion vs circumduction. Pt propelled to day room and handed off to PT for next session.      Therapy Documentation Precautions:  Precautions Precautions: Fall, Back Precaution Comments: No brace ordered. AFO for R foot drop. Required Braces or Orthoses: Other Brace/Splint Other Brace/Splint: R AFO Restrictions Weight Bearing Restrictions: No General:   Vital Signs: Therapy Vitals Temp: 98.2 F (36.8 C) Temp Source: Oral Pulse Rate: 79 Resp: 17 BP: 129/85 Patient Position (if appropriate): Sitting Oxygen Therapy SpO2: 99 % O2 Device: Room Air Pain: Pain Assessment Pain Scale: 0-10 Pain Score: 6  Pain Location: Back Pain Orientation: Right Pain Descriptors / Indicators: Aching Patients Stated Pain Goal: 3 Pain Intervention(s): Medication (See eMAR);Repositioned  See Function Navigator for Current Functional Status.   Therapy/Group: Individual Therapy  Redford Behrle  Marki Frede, PTA  03/16/2018, 3:26 PM

## 2018-03-17 ENCOUNTER — Inpatient Hospital Stay (HOSPITAL_COMMUNITY): Payer: 59

## 2018-03-17 ENCOUNTER — Ambulatory Visit (HOSPITAL_COMMUNITY): Payer: 59 | Admitting: Physical Therapy

## 2018-03-17 ENCOUNTER — Inpatient Hospital Stay (HOSPITAL_COMMUNITY): Payer: 59 | Admitting: Occupational Therapy

## 2018-03-17 NOTE — Progress Notes (Signed)
Occupational Therapy Session Note  Patient Details  Name: [name] MRN: [id number] Date of Birth: 1961/01/03  Today's Date: 03/17/2018 OT Individual Time: 1500-1602 OT Individual Time Calculation (min): 62 min    Skilled Therapeutic Interventions/Progress Updates:    Upon entering the room, pt seated in wheelchair awaiting therapist and requesting to go outside. Pt propelling wheelchair with B UE's 150' with increased time and OT assisted pt the rest of the way outside. Pt taking seated rest break and therapist continued to discuss discharge planning. Pt ambulating on uneven surface with use of RW 100' with steady assistance for safety. Pt with slow gait speed but increased safety awareness for task. Pt returning to room and transferred onto bed for pain management. RN arrived with medications. Pt positioned on L side with bed alarm activated and call bell within reach upon exiting the room.   Therapy Documentation Precautions:  Precautions Precautions: Fall, Back Precaution Comments: No brace ordered. AFO for R foot drop. Required Braces or Orthoses: Other Brace/Splint Other Brace/Splint: R AFO Restrictions Weight Bearing Restrictions: No General:   Vital Signs: Therapy Vitals Temp: 98.7 F (37.1 C) Pulse Rate: 87 Resp: 18 BP: 133/90 Patient Position (if appropriate): Sitting Oxygen Therapy SpO2: 99 % O2 Device: Room Air  See Function Navigator for Current Functional Status.   Therapy/Group: Individual Therapy  [name] 03/17/2018, 4:07 PM

## 2018-03-17 NOTE — Progress Notes (Signed)
Physical Therapy Session Note  Patient Details  Name: [name] MRN: [id number] Date of Birth: [birth year]  Today's Date: 03/17/2018 PT Individual Time: 0800-0858 PT Individual Time Calculation (min): 58 min   Short Term Goals: Week 1:  PT Short Term Goal 1 (Week 1): = LTG  Skilled Therapeutic Interventions/Progress Updates:    Pt seated in w/c upon PT arrival, agreeable to therapy tx and reports pain 5/10 in R lower back area. Therapist assisted pt to don teds, shoes and R AFO. Pt propelled w/c from room>gym with supervision using B UEs x 150 ft. Pt seated in w/c performed exercises for R LE NMR including seated hip flexion and LAQ, x 10 each. Pt performed sit<>stand with RW and supervision, in standing pt worked on performing standing hip flexion in place with R LE for NMR. Pt ambulated 2 x 40 ft with RW and min assist, verbal cues for increased R step length and tactile cues for hip flexion. Pt transported back to room and left seated in w/c with needs in reach.   Therapy Documentation Precautions:  Precautions Precautions: Fall, Back Precaution Comments: No brace ordered. AFO for R foot drop. Required Braces or Orthoses: Other Brace/Splint Other Brace/Splint: R AFO Restrictions Weight Bearing Restrictions: No   See Function Navigator for Current Functional Status.   Therapy/Group: Individual Therapy  Netta Corrigan, PT, DPT 03/17/2018, 7:56 AM

## 2018-03-17 NOTE — Progress Notes (Signed)
Occupational Therapy Note  Patient Details  Name: ELYANAH FARINO MRN: [id number] Date of Birth: [birth year]  Today's Date: 03/17/2018 OT Individual Time: 1552-0802 OT Individual Time Calculation (min): 41 min   Upon entering the room, pt seated in wheelchair with husband present for family education. Pt declined bathing and dressing this session. OT educated caregiver on back precautions, pt's use of AE for independence, pt progress/goal level, and safety risks. Caregiver verbalized understanding and asking appropriate questions. Pt able to provide verbal cues to caregiver for donning AFO, abdominal binder, and TED hose. OT educated pt and caregiver on energy conservation techniques at home to increase pt independence and decrease safety risk. Pt remained in wheelchair at end of session with husband present in room.    Darleen Crocker P 03/17/2018, 11:18 AM

## 2018-03-17 NOTE — Patient Care Conference (Signed)
Inpatient RehabilitationTeam Conference and Plan of Care Update Date: 03/16/2018   Time: 2:30 pm    Patient Name: [name]      Medical Record Number: [id number]  Date of Birth: 09/03/1961 Sex: Female         Room/Bed: 4W05C/4W05C-01 Payor Info: Payor: [employer] / Plan: [employer] / Product Type: *No Product type* /    Admitting Diagnosis: Spinal Stenosis  Admit Date/Time:  03/09/2018  5:16 PM Admission Comments: No comment available   Primary Diagnosis:  Lumbar radiculopathy Principal Problem: Lumbar radiculopathy  Patient Active Problem List   Diagnosis Date Noted  . Depression with anxiety   . Lumbar radiculopathy 03/09/2018  . Paraparesis ([name]) 03/09/2018  . Essential hypertension   . Chronic nonintractable headache   . Leukocytosis   . Acute blood loss anemia   . Postoperative pain   . Generalized weakness   . Spinal stenosis at L4-L5 level 03/04/2018  . Spinal stenosis of lumbar region 12/28/2015  . Chest pain 04/16/2015    Expected Discharge Date: Expected Discharge Date: 03/19/18  Team Members Present: Physician leading conference: Dr. Alger Simons Social Worker Present: Lennart Pall, LCSW PT Present: Roderic Ovens, PT OT Present: Benay Pillow, OT SLP Present: Weston Anna, SLP PPS Coordinator present : Ileana Ladd, PT     Current Status/Progress Goal Weekly Team Focus  Medical   Patient admitted with multiple leve lumbar radiculopathies and right sided weakness.  Patient status post surgical decompression and follow-up surgery to remove hematoma  Improve functional use of right lower extremity  Pain control, education, ego support   Bowel/Bladder   continent of bowel and bladder, LBM 03-15-18  remain continent of bowel and bladder, maintain regular bowel pattern  Assist patient with tolieting needs    Swallow/Nutrition/ Hydration             ADL's   Min A overall with use of AE. Steady assist functional bathroom transfers at  ambulatory level using RW  Supervision-Min A  D/c planning, pt/family education, balance, ADL retraining    Mobility   min A overall  supervision overall  d/c planning, family ed   Communication             Safety/Cognition/ Behavioral Observations            Pain             Skin   incision to lower back with dry dressing  no new skin issues  Assess skin q shift, monitor back incision q shift     Rehab Goals Patient on target to meet rehab goals: Yes *See Care Plan and progress notes for long and short-term goals.     Barriers to Discharge  Current Status/Progress Possible Resolutions Date Resolved   Physician    Medical stability;Behavior               Nursing                  PT                    OT Other (comments)(None known at this time)                SLP                SW                Discharge Planning/Teaching Needs:  home with spouse and son  able to provide 24/7 support if needed.  Therapies scheduling family ed this week.   Team Discussion:  Emotionally labile;  Snesory/ motor deficits;  No evidence of myelopathy on scans. Meeting supervision to min assist goals overall.   Revisions to Treatment Plan:  None    Continued Need for Acute Rehabilitation Level of Care: The patient requires daily medical management by a physician with specialized training in physical medicine and rehabilitation for the following conditions: Daily direction of a multidisciplinary physical rehabilitation program to ensure safe treatment while eliciting the highest outcome that is of practical value to the patient.: Yes Daily medical management of patient stability for increased activity during participation in an intensive rehabilitation regime.: Yes Daily analysis of laboratory values and/or radiology reports with any subsequent need for medication adjustment of medical intervention for : Post surgical problems  Diquan Kassis 03/17/2018, 9:03 AM

## 2018-03-17 NOTE — Progress Notes (Signed)
Physical Therapy Session Note  Patient Details  Name: [name] MRN: [id number] Date of Birth: 08/09/1961  Today's Date: 03/17/2018 PT Individual Time: 1115-1200 PT Individual Time Calculation (min): 45 min   Short Term Goals: Week 1:  PT Short Term Goal 1 (Week 1): = LTG  Skilled Therapeutic Interventions/Progress Updates:    Pt seated in w/c in room, agreeable to PT. No complaints of pain. Manual w/c propulsion x 150 ft with BUE Supervision. Car transfer with Supervision with use of RW and leg lifter, v/c for sequencing for safe transfer. Family training with husband for stairs. Ascend/descend 4 stairs with 2 handrails and mod A for balance and to assist with advancing RLE on stairs due to hip flexor weakness, demonstration and then return demo from patient and husband. Patient and her husband demo good safety and performance of stairs. Pt left seated in w/c in room with needs in reach at end of session, husband present.  Therapy Documentation Precautions:  Precautions Precautions: Fall, Back Precaution Comments: No brace ordered. AFO for R foot drop. Required Braces or Orthoses: Other Brace/Splint Other Brace/Splint: R AFO Restrictions Weight Bearing Restrictions: No  See Function Navigator for Current Functional Status.   Therapy/Group: Individual Therapy  Excell Seltzer, PT, DPT  03/17/2018, 3:14 PM

## 2018-03-17 NOTE — Progress Notes (Signed)
[facility]ILIT[facility]     PROGRESS NOTE    Subjective/Complaints: In good spirits. Happy about dc Friday.   ROS: Patient denies fever, rash, sore throat, blurred vision, nausea, vomiting, diarrhea, cough, shortness of breath or chest pain, joint or back pain, headache, or mood change. .   Objective:  No results found. No results for input(s): WBC, HGB, HCT, PLT in the last 72 hours. No results for input(s): NA, K, CL, GLUCOSE, BUN, CREATININE, CALCIUM in the last 72 hours.  Invalid input(s): CO CBG (last 3)  Recent Labs    03/15/18 2229 03/16/18 0632  GLUCAP 134* 91    Wt Readings from Last 3 Encounters:  03/17/18 73.2 kg (161 lb 6 oz)  03/04/18 65 kg (143 lb 4.8 oz)  02/23/18 64.6 kg (142 lb 8 oz)     Intake/Output Summary (Last 24 hours) at 03/17/2018 0856 Last data filed at 03/17/2018 0759 Gross per 24 hour  Intake 720 ml  Output -  Net 720 ml    Vital Signs: Blood pressure 116/77, pulse 75, temperature (!) 97.5 F (36.4 C), temperature source Oral, resp. rate 16, height 5\' 5"  (1.651 m), weight 73.2 kg (161 lb 6 oz), SpO2 99 %. Physical Exam:  Constitutional: No distress . Vital signs reviewed. HEENT: EOMI, oral membranes moist Neck: supple Cardiovascular: RRR without murmur. No JVD    Respiratory: CTA Bilaterally without wheezes or rales. Normal effort    GI: BS +, non-tender, non-distended  Musculoskeletal: Back tender.  Neurological: She isalertand oriented to person, place, and time. UE 4/5 prox to distal. LLE 2+ to 3/5 hf, 3/5 ke 4/5 adf/pf. RLE: 1 to 1+/5 hf, ke, adf/pf---effort remains inconsistent. +Hoovers.  Decreased LT right foot, leg and lateral leg which remains somewhat variable also. DTR's remain trace to absent in LE's. Psychiatric:pleasant and smiling. Skin: incision clean and dry with staples    Assessment/Plan: 1. Functional and mobility deficits secondary to multiple lumbar radiculopathies which require  3+ hours per day of interdisciplinary therapy in a comprehensive inpatient rehab setting. Physiatrist is providing close team supervision and 24 hour management of active medical problems listed below. Physiatrist and rehab team continue to assess barriers to discharge/monitor patient progress toward functional and medical goals.  Function:  Bathing Bathing position Bathing activity did not occur: Refused Position: Shower  Bathing parts Body parts bathed by patient: Right arm, Left arm, Chest, Abdomen, Left lower leg, Right lower leg, Front perineal area, Right upper leg, Left upper leg, Buttocks Body parts bathed by helper: Back  Bathing assist Assist Level: Supervision or verbal cues Assistive Device Comment: LH sponge Set up : To obtain items  Upper Body Dressing/Undressing Upper body dressing Upper body dressing/undressing activity did not occur: Refused What is the patient wearing?: Bra, Pull over shirt/dress Bra - Perfomed by patient: Thread/unthread right bra strap, Thread/unthread left bra strap, Hook/unhook bra (pull down sports bra)   Pull over shirt/dress - Perfomed by patient: Thread/unthread right sleeve, Put head through opening, Thread/unthread left sleeve, Pull shirt over trunk          Upper body assist Assist Level: Supervision or verbal cues   Set up : To obtain clothing/put away  Lower Body Dressing/Undressing Lower body dressing Lower body dressing/undressing activity did not occur: Refused What is the patient wearing?: Underwear, Pants, Shoes, [employer]  - Performed by patient: Thread/unthread right underwear leg, Thread/unthread left underwear leg, Pull underwear up/down   Pants- Performed by patient: Thread/unthread right  pants leg, Thread/unthread left pants leg, Pull pants up/down   Non-skid slipper socks- Performed by patient: Don/doff right sock, Don/doff left sock Non-skid slipper socks- Performed by helper: Don/doff right sock, Don/doff left  sock     Shoes - Performed by patient: Don/doff left shoe Shoes - Performed by helper: Don/doff left shoe, Fasten right, Fasten left   AFO - Performed by helper: Don/doff right AFO TED Hose - Performed by patient: Don/doff left TED hose TED Hose - Performed by helper: Don/doff right TED hose  Lower body assist Assist for lower body dressing: Touching or steadying assistance (Pt > 75%) Assistive Device Comment: Sock aide + shoe funnel    Toileting Toileting   Toileting steps completed by patient: Performs perineal hygiene, Adjust clothing after toileting, Adjust clothing prior to toileting Toileting steps completed by helper: Adjust clothing prior to toileting, Adjust clothing after toileting Toileting Assistive Devices: Grab bar or rail  Toileting assist Assist level: Supervision or verbal cues   Transfers Chair/bed transfer   Chair/bed transfer method: Stand pivot Chair/bed transfer assist level: Supervision or verbal cues Chair/bed transfer assistive device: Armrests, Medical sales representative     Max distance: 64ft Assist level: Touching or steadying assistance (Pt > 75%)   Wheelchair   Type: Manual Max wheelchair distance: 100' Assist Level: Touching or steadying assistance (Pt > 75%)  Cognition Comprehension Comprehension assist level: Follows complex conversation/direction with no assist  Expression Expression assist level: Expresses complex ideas: With extra time/assistive device  Social Interaction Social Interaction assist level: Interacts appropriately 90% of the time - Needs monitoring or encouragement for participation or interaction.  Problem Solving Problem solving assist level: Solves basic 75 - 89% of the time/requires cueing 10 - 24% of the time  Memory Memory assist level: More than reasonable amount of time  Medical Problem List and Plan: 1.Functional and mobility deficitssecondary to lumbar stenosis with multiple-level radiculopathies s/p  Decompression and re-do. Some of weakness remains non-organic. continue therapies.  Progressing in mobility  -ELOS 5/24 2. DVT Prophylaxis/Anticoagulation: Pharmaceutical:Lovenox--dc at discharge 3. Pain Management:Oxycodoneand robaxin prn effective. On gabapentin tid for neuropathic pain.--fair control at present 4. Mood:providing daily + reinforcement.  5. Neuropsych: This patientiscapable of making decisions on herown behalf. 6. Skin/Wound Care:Monitor wound for healing.can probably remove staples tomorrow 7. Fluids/Electrolytes/Nutrition:encourage fluids/po 8. HTN: Monitor BP bid--continue to coreg bid.   -controlled 5/22  -binder to prevent orthostasis   -reduced coreg to 6.25mg  bid  -encourage fluids 9. ABLA: Post op blood loss--hgb stable  at 10.7 10. Leucocytosis:?steroid effect. WBC's down to 5.1 11. Constipation:Improved--continue Miralax. 12. Anxiety disorder: Continue ativan prn for now. See above  -team to continue providing positive reinforcement  -improving mood with improving functional abilities     .   LOS (Days) [place] EVALUATION WAS PERFORMED  Meredith Staggers, MD 03/17/2018 8:56 AM

## 2018-03-18 ENCOUNTER — Inpatient Hospital Stay (HOSPITAL_COMMUNITY): Payer: 59 | Admitting: Physical Therapy

## 2018-03-18 ENCOUNTER — Inpatient Hospital Stay (HOSPITAL_COMMUNITY): Payer: 59 | Admitting: Occupational Therapy

## 2018-03-18 NOTE — Progress Notes (Addendum)
[facility]     PROGRESS NOTE    Subjective/Complaints: Had a good day yesterday.  Tired from all the activity she had.  Walks quite a bit.  AFO working nicely for her.  ROS: Patient denies fever, rash, sore throat, blurred vision, nausea, vomiting, diarrhea, cough, shortness of breath or chest pain, joint or back pain, headache, or mood change.    Objective:  No results found. No results for input(s): WBC, HGB, HCT, PLT in the last 72 hours. No results for input(s): NA, K, CL, GLUCOSE, BUN, CREATININE, CALCIUM in the last 72 hours.  Invalid input(s): CO CBG (last 3)  Recent Labs    03/15/18 2229 03/16/18 0632  GLUCAP 134* 91    Wt Readings from Last 3 Encounters:  03/17/18 73.2 kg (161 lb 6 oz)  03/04/18 65 kg (143 lb 4.8 oz)  02/23/18 64.6 kg (142 lb 8 oz)     Intake/Output Summary (Last 24 hours) at 03/18/2018 0902 Last data filed at 03/17/2018 1900 Gross per 24 hour  Intake 480 ml  Output -  Net 480 ml    Vital Signs: Blood pressure 113/69, pulse 85, temperature 97.8 F (36.6 C), temperature source Oral, resp. rate 18, height 5\' 5"  (1.651 m), weight 73.2 kg (161 lb 6 oz), SpO2 99 %. Physical Exam:  Constitutional: No distress . Vital signs reviewed. HEENT: EOMI, oral membranes moist Neck: supple Cardiovascular: RRR without murmur. No JVD    Respiratory: CTA Bilaterally without wheezes or rales. Normal effort    GI: BS +, non-tender, non-distended   Musculoskeletal: Back tender.  Neurological: She isalertand oriented to person, place, and time. UE 4/5 prox to distal. LLE 2+ to 3/5 hf, 3/5 ke 4/5 adf/pf. RLE: 1 to 1+/5 hf, ke, adf/pf---effort remains inconsistent. +Hoovers still positive.  Decreased LT right foot, leg and lateral leg which remains somewhat variable also. DTR's remain trace to absent in LE's. Psychiatric:pleasant and smiling. Skin: incision clean and dry with staples    Assessment/Plan: 1.  Functional and mobility deficits secondary to multiple lumbar radiculopathies which require 3+ hours per day of interdisciplinary therapy in a comprehensive inpatient rehab setting. Physiatrist is providing close team supervision and 24 hour management of active medical problems listed below. Physiatrist and rehab team continue to assess barriers to discharge/monitor patient progress toward functional and medical goals.  Function:  Bathing Bathing position Bathing activity did not occur: Refused Position: Shower  Bathing parts Body parts bathed by patient: Right arm, Left arm, Chest, Abdomen, Left lower leg, Right lower leg, Front perineal area, Right upper leg, Left upper leg, Buttocks Body parts bathed by helper: Back  Bathing assist Assist Level: Supervision or verbal cues Assistive Device Comment: LH sponge Set up : To obtain items  Upper Body Dressing/Undressing Upper body dressing Upper body dressing/undressing activity did not occur: Refused What is the patient wearing?: Bra, Pull over shirt/dress Bra - Perfomed by patient: Thread/unthread right bra strap, Thread/unthread left bra strap, Hook/unhook bra (pull down sports bra)   Pull over shirt/dress - Perfomed by patient: Thread/unthread right sleeve, Put head through opening, Thread/unthread left sleeve, Pull shirt over trunk          Upper body assist Assist Level: Supervision or verbal cues   Set up : To obtain clothing/put away  Lower Body Dressing/Undressing Lower body dressing Lower body dressing/undressing activity did not occur: Refused What is the patient wearing?: Underwear, Pants, Shoes, [employer]  - Performed by patient: Thread/unthread  right underwear leg, Thread/unthread left underwear leg, Pull underwear up/down   Pants- Performed by patient: Thread/unthread right pants leg, Thread/unthread left pants leg, Pull pants up/down   Non-skid slipper socks- Performed by patient: Don/doff right sock, Don/doff  left sock Non-skid slipper socks- Performed by helper: Don/doff right sock, Don/doff left sock     Shoes - Performed by patient: Don/doff left shoe Shoes - Performed by helper: Don/doff left shoe, Fasten right, Fasten left   AFO - Performed by helper: Don/doff right AFO TED Hose - Performed by patient: Don/doff left TED hose TED Hose - Performed by helper: Don/doff right TED hose  Lower body assist Assist for lower body dressing: Touching or steadying assistance (Pt > 75%) Assistive Device Comment: Sock aide + shoe funnel    Toileting Toileting   Toileting steps completed by patient: Performs perineal hygiene, Adjust clothing after toileting, Adjust clothing prior to toileting Toileting steps completed by helper: Adjust clothing prior to toileting, Adjust clothing after toileting Toileting Assistive Devices: Grab bar or rail  Toileting assist Assist level: Supervision or verbal cues   Transfers Chair/bed transfer   Chair/bed transfer method: Stand pivot Chair/bed transfer assist level: Supervision or verbal cues Chair/bed transfer assistive device: Armrests, Medical sales representative     Max distance: 100' Assist level: Touching or steadying assistance (Pt > 75%)   Wheelchair   Type: Manual Max wheelchair distance: 150' Assist Level: Supervision or verbal cues  Cognition Comprehension Comprehension assist level: Follows complex conversation/direction with no assist  Expression Expression assist level: Expresses complex ideas: With extra time/assistive device  Social Interaction Social Interaction assist level: Interacts appropriately 90% of the time - Needs monitoring or encouragement for participation or interaction.  Problem Solving Problem solving assist level: Solves basic 75 - 89% of the time/requires cueing 10 - 24% of the time  Memory Memory assist level: More than reasonable amount of time, Recognizes or recalls 90% of the time/requires cueing < 10% of the  time  Medical Problem List and Plan: 1.Functional and mobility deficitssecondary to lumbar stenosis with multiple-level radiculopathies s/p Decompression and re-do. Some of weakness remains non-organic. continue therapies.  Progressing in mobility  -ELOS 5/24  -Patient to see Rehab MD/provider in the office for transitional care encounter in 1-2 weeks.  2. DVT Prophylaxis/Anticoagulation: Pharmaceutical:Lovenox--dc at discharge 3. Pain Management:Oxycodoneand robaxin prn effective. On gabapentin tid for neuropathic pain.--fair control at present 4. Mood:providing daily + reinforcement.  5. Neuropsych: This patientiscapable of making decisions on herown behalf. 6. Skin/Wound Care:Remove staples today 7. Fluids/Electrolytes/Nutrition:encourage fluids/po 8. HTN: Monitor BP bid--continue to coreg bid.   -controlled 5/23  -binder to prevent orthostasis   -reduced coreg to 6.25mg  bid  -taking in plenty of fluids 9. ABLA: Post op blood loss--hgb stable  at 10.7 10. Leucocytosis:?steroid effect. WBC's down to 5.1 11. Constipation:Improved--continue Miralax. 12. Anxiety disorder: improved overall  -team to continue providing positive reinforcement  -improving mood with improving functional abilities     .   LOS (Days) [place] EVALUATION WAS PERFORMED  Meredith Staggers, MD 03/18/2018 9:02 AM

## 2018-03-18 NOTE — Progress Notes (Signed)
Physical Therapy Discharge Summary  Patient Details  Name: [name] MRN: [id number] Date of Birth: 1961/06/15  Today's Date: 03/18/2018 PT Individual Time: 9326-7124 AND 1445-1500 PT Individual Time Calculation (min): 55 min AND 15 min Missed time (min): 45 min (refusal)   Session 1:  Pt in w/c and agreeable to therapy, denies pain at rest and pain 5/10 w/ mobility in back. She also reports increased stiffness this session. Performed toilet transfer w/ continent void. Supervision for pericare and transfer. Pt self-propelled w/c around unit w/ BUEs, w/ supervision, to work on endurance and independence w/ locomotion. Performed gait as detailed below and car transfer. Returned to room and performed bed mobility w/ verbal cues for back precautions. Ended session in supine, call bell within reach and all needs met.   Session 2:  Pt in w/c and initially refusing therapy this afternoon. She states she is exhausted and very emotional about her godmother's funeral tomorrow morning. However agreeable to practice stairs only w/ encouragement as that was the last discharge task to perform. Total assist w/c transport to/from gym. Negotiated 4 steps w/ 2 rails and min assist for weight shifting. Pt able to verbalized proper and safe technique without prompting from therapist. Returned to room and ended session in w/c, call bell within reach and all needs met.    Patient has met 7 of 7 long term goals due to improved balance, increased strength, ability to compensate for deficits and functional use of  right lower extremity.  Patient to discharge at an ambulatory level Supervision.   Patient's care partner is independent to provide the necessary physical assistance at discharge.  Reasons goals not met: n/a  Recommendation:  Patient will benefit from ongoing skilled PT services in home health setting to continue to advance safe functional mobility, address ongoing impairments in functional mobility,  functional balance, sensation, and RLE weakness, and minimize fall risk.  Equipment: w/c  Reasons for discharge: treatment goals met and discharge from hospital  Patient/family agrees with progress made and goals achieved: Yes  PT Discharge Precautions/Restrictions Precautions Precaution Comments: No brace for back precautions, RAFO for foot drop Required Braces or Orthoses: Knee Immobilizer - Right Other Brace/Splint: R AFO Restrictions Weight Bearing Restrictions: No Pain Assessment Pain Scale: 0-10 Pain Score: 5  Pain Type: Surgical pain Pain Location: Back Vision/Perception  Perception Perception: Within Functional Limits Praxis Praxis: Intact  Cognition Overall Cognitive Status: Within Functional Limits for tasks assessed Arousal/Alertness: Awake/alert Orientation Level: Oriented X4 Memory: Appears intact Awareness: Appears intact Problem Solving: Appears intact Safety/Judgment: Appears intact Sensation Sensation Light Touch: Impaired Detail Light Touch Impaired Details: Absent RLE(absent sensation from knee down, otherwise normal in hip/thigh) Proprioception: Impaired Detail Proprioception Impaired Details: Impaired RLE Coordination Gross Motor Movements are Fluid and Coordinated: No Fine Motor Movements are Fluid and Coordinated: Yes Coordination and Movement Description: RLE stiffness preventing fluid coordination Motor  Motor Motor: Abnormal tone;Other (comment)(R foot drop) Motor - Discharge Observations: increased extensor tone R knee   Mobility Bed Mobility Bed Mobility: Supine to Sit;Sit to Supine;Rolling Left;Rolling Right Rolling Right: 5: Supervision Rolling Left: 5: Supervision Supine to Sit: 5: Supervision Sit to Supine: 5: Supervision Transfers Transfers: Yes Sit to Stand: 5: Supervision Stand to Sit: 5: Supervision Stand Pivot Transfers: 5: Supervision Locomotion  Ambulation Ambulation: Yes Ambulation/Gait Assistance: 5:  Supervision Ambulation Distance (Feet): 50 Feet Assistive device: Rolling walker Gait Gait: Yes Gait Pattern: Impaired Gait Pattern: Poor foot clearance - right Gait velocity: decreased Stairs / Additional Locomotion  Stairs: Yes Stairs Assistance: 4: Min assist Stairs Assistance Details: Verbal cues for technique;Verbal cues for precautions/safety Stair Management Technique: Two rails Number of Stairs: 4 Height of Stairs: 6 Wheelchair Mobility Wheelchair Mobility: Yes Wheelchair Assistance: 5: Careers information officer: Both upper extremities Wheelchair Parts Management: Independent Distance: 150'  Trunk/Postural Assessment  Cervical Assessment Cervical Assessment: Within Functional Limits Thoracic Assessment Thoracic Assessment: Within Functional Limits Lumbar Assessment Lumbar Assessment: Exceptions to WFL(increased stiffness, back precautions) Postural Control Postural Control: Within Functional Limits  Balance Balance Balance Assessed: Yes Dynamic Standing Balance Dynamic Standing - Level of Assistance: 5: Stand by assistance Dynamic Standing - Comments: verbal cues for balance precautions Extremity Assessment  RLE Assessment RLE Assessment: Exceptions to WFL(increased siffness throughout, unable to reach full ankle DF, hip and knee strength 2/5, ankle 0/5) LLE Assessment LLE Assessment: Within Functional Limits   See Function Navigator for Current Functional Status.  [name] [name] 03/18/2018, 9:56 AM

## 2018-03-18 NOTE — Progress Notes (Signed)
Social Work Patient ID: [name], female   DOB: [birth year], 57 y.o.   MRN: [id number]  Long discussion this morning with pt to review d/c arrangements and provide support as she is grieving the loss of her godmother and has concerns about managing at home (esp fearful of falling in shower).   Allowed pt to talk about her loss and her fears and she was appreciative of being able to do this.  We talked about follow up supports should she need them after CIR as well.   Pt does feel ready for d/c tomorrow and states, "I'm gonna get through this.  It's just so hard to lose control."   HH and DME ordered.    Jahziel Sinn, LCSW

## 2018-03-18 NOTE — Progress Notes (Signed)
Occupational Therapy Discharge Summary  Patient Details  Name: EMMALEIGH LONGO MRN: [id number] Date of Birth: 1961-04-28  Today's Date: 03/18/2018 OT Individual Time: 0703-0800 and 4098-1191 OT Individual Time Calculation (min): 57 min and 28 min    Patient has met 8 of 8 long term goals due to improved activity tolerance, improved balance, ability to compensate for deficits and improved coordination.  Patient to discharge at overall mod I - Supervision level.  Patient's care partner is independent to provide the necessary physical and cognitive assistance at discharge.    Reasons goals not met: all goals met  Recommendation:  Patient will benefit from ongoing skilled OT services in home health setting to continue to advance functional skills in the area of BADL and iADL.  Equipment: No equipment provided  Reasons for discharge: treatment goals met  Patient/family agrees with progress made and goals achieved: Yes   OT intervent Session 1: Upon entering the room, pt supine in bed and reports, " I am so stiff from yesterday." Pt requesting to shower this session. Pt ambulates from bed and ambulates to dresser to obtain clothing items and leaving them on bed. Pt ambulating to bathroom for toileting with overall supervision for transfer and balance with clothing management. Pt transferred onto TTB with steady assistance. Pt remained seated while bathing and performed lateral leans to wash buttocks and peri area and use of LH sponge to wash back and B feet. Pt exiting the shower and seated on EOB to don clothing items. Pt needing min A to don R TED hose, shoe, and AFO. Pt transferred back into wheelchair with supervision and use of RW. Pt completed grooming tasks at sink at mod I level. Call bell and all needed items within reach upon exiting the room.   Session 2: Pt supine in bed upon entering the room, pt performed stand pivot with use of RW to wheelchair. Session with focus on home  management tasks from seated position with use of AE as needed to maintain back precautions. Pt cleaning and packing room for discharge while seated in wheelchair with overall supervision and min cues for safety. Pt remained in wheelchair at end of session with call bell and all needed items within reach .  OT Discharge Precautions/Restrictions  Precautions Precautions: Fall;Back Precaution Comments: No brace for back precautions, RAFO for foot drop Required Braces or Orthoses: Knee Immobilizer - Right Other Brace/Splint: R AFO Restrictions Weight Bearing Restrictions: No Pain Pain Assessment Pain Scale: 0-10 Pain Score: 5  Pain Type: Surgical pain Pain Location: Back Pain Orientation: Right Pain Descriptors / Indicators: Aching;Guarding Vision Baseline Vision/History: Wears glasses Wears Glasses: Reading only Perception  Perception: Within Functional Limits Praxis Praxis: Intact Cognition Overall Cognitive Status: Within Functional Limits for tasks assessed Arousal/Alertness: Awake/alert Orientation Level: Oriented X4 Memory: Appears intact Awareness: Appears intact Problem Solving: Appears intact Safety/Judgment: Appears intact Sensation Sensation Light Touch: Impaired Detail Light Touch Impaired Details: Absent RLE Stereognosis: Not tested Hot/Cold: Appears Intact Proprioception: Impaired Detail Proprioception Impaired Details: Impaired RLE Coordination Gross Motor Movements are Fluid and Coordinated: No Fine Motor Movements are Fluid and Coordinated: Yes Coordination and Movement Description: RLE stiffness preventing fluid coordination Motor  Motor Motor: Abnormal tone;Other (comment) Motor - Discharge Observations: increased extensor tone R knee  Mobility  Bed Mobility Bed Mobility: Supine to Sit;Sit to Supine;Rolling Left;Rolling Right Rolling Right: 5: Supervision Rolling Left: 5: Supervision Supine to Sit: 5: Supervision Sit to Supine: 5:  Supervision Transfers Sit to Stand: 5: Supervision Stand  to Sit: 5: Supervision  Trunk/Postural Assessment  Cervical Assessment Cervical Assessment: Within Functional Limits Thoracic Assessment Thoracic Assessment: Within Functional Limits Lumbar Assessment Lumbar Assessment: Exceptions to [facility] Postural Control Postural Control: Within Functional Limits  Balance Balance Balance Assessed: Yes Dynamic Standing Balance Dynamic Standing - Level of Assistance: 5: Stand by assistance Dynamic Standing - Comments: verbal cues for balance precautions Extremity/Trunk Assessment RUE Assessment RUE Assessment: Within Functional Limits LUE Assessment LUE Assessment: Within Functional Limits   See Function Navigator for Current Functional Status.  Darleen Crocker P 03/18/2018, 11:30 AM

## 2018-03-18 NOTE — Progress Notes (Signed)
Pt requested to wait until she is ready to go to bed to have staples removed.

## 2018-03-19 DIAGNOSIS — N179 Acute kidney failure, unspecified: Secondary | ICD-10-CM

## 2018-03-19 DIAGNOSIS — D62 Acute posthemorrhagic anemia: Secondary | ICD-10-CM

## 2018-03-19 DIAGNOSIS — I10 Essential (primary) hypertension: Secondary | ICD-10-CM

## 2018-03-19 MED ORDER — TRAMADOL HCL 50 MG PO TABS: 50.0000 mg | ORAL_TABLET | Freq: Four times a day (QID) | ORAL | 0 refills | Status: DC | PRN

## 2018-03-19 MED ORDER — POLYETHYLENE GLYCOL 3350 17 G PO PACK: 17.0000 g | PACK | Freq: Every day | ORAL | 0 refills | Status: DC

## 2018-03-19 MED ORDER — DOCUSATE SODIUM 100 MG PO CAPS: 100.0000 mg | ORAL_CAPSULE | Freq: Two times a day (BID) | ORAL | 1 refills | Status: DC

## 2018-03-19 MED ORDER — OXYCODONE HCL 5 MG PO TABS: 5.0000 mg | ORAL_TABLET | Freq: Two times a day (BID) | ORAL | 0 refills | Status: DC | PRN

## 2018-03-19 MED ORDER — GABAPENTIN 300 MG PO CAPS: 300.0000 mg | ORAL_CAPSULE | Freq: Three times a day (TID) | ORAL | 0 refills | Status: DC

## 2018-03-19 MED ORDER — ALPRAZOLAM 0.25 MG PO TABS: 0.2500 mg | ORAL_TABLET | Freq: Every day | ORAL | 0 refills | Status: DC | PRN

## 2018-03-19 MED ORDER — ACETAMINOPHEN 325 MG PO TABS: 325.0000 mg | ORAL_TABLET | ORAL | Status: DC | PRN

## 2018-03-19 MED ORDER — RISAQUAD PO CAPS: 1.0000 | ORAL_CAPSULE | Freq: Every day | ORAL | Status: DC

## 2018-03-19 MED ORDER — CARVEDILOL 6.25 MG PO TABS: 6.2500 mg | ORAL_TABLET | Freq: Two times a day (BID) | ORAL | 0 refills | Status: DC

## 2018-03-19 MED ORDER — METHOCARBAMOL 500 MG PO TABS: 500.0000 mg | ORAL_TABLET | Freq: Four times a day (QID) | ORAL | 0 refills | Status: DC | PRN

## 2018-03-19 MED FILL — oxyCODONE HCL 5 MG TABS: 5 | 3 days supply | Qty: 7 | Fill #0

## 2018-03-19 MED FILL — GABAPENTIN 300 MG CAPSULE: 300 | 30 days supply | Qty: 90 | Fill #0

## 2018-03-19 MED FILL — CARVEDILOL 6.25 MG TABLET: 6.25 | 30 days supply | Qty: 60 | Fill #0

## 2018-03-19 MED FILL — traMADol HCL 50 MG TABS: 50 | 5 days supply | Qty: 21 | Fill #0

## 2018-03-19 MED FILL — ALPRAZolam 0.25 MG TABS: 0.25 | 5 days supply | Qty: 5 | Fill #0

## 2018-03-19 MED FILL — METHOCARBAMOL 500 MG TABS: 500 | 30 days supply | Qty: 120 | Fill #0

## 2018-03-19 NOTE — Progress Notes (Signed)
Pt discharged home with husband. PA discussed discharged plans and medications.

## 2018-03-19 NOTE — Discharge Instructions (Signed)
Inpatient Rehab Discharge Instructions  [name] Discharge date and time: 03/19/18   Activities/Precautions/ Functional Status: Activity: no lifting, driving, or strenuous exercise till cleared by MD Diet: regular diet Wound Care: keep wound clean and dry. Contact MD if you develop any problems with your incision/wound--redness, swelling, increase in pain, drainage or if you develop fever or chills.   Functional status:  ___ No restrictions     ___ Walk up steps independently _X__ 24/7 supervision/assistance   ___ Walk up steps with assistance ___ Intermittent supervision/assistance  ___ Bathe/dress independently ___ Walk with walker     _X__ Bathe/dress with assistance ___ Walk Independently    ___ Shower independently _X__ Walk with supervision             ___ Shower with assistance __X_ No alcohol     ___ Return to work/school ________   Special Instructions: 1. NO bending, twisting or arching.    COMMUNITY REFERRALS UPON DISCHARGE:    Home Health:   PT     OT                       Agency:  [facility] Phone: [phone]   Medical Equipment/Items Ordered:  Wheelchair and cushion                                                       Agency/Supplier:  [facility] [phone]    My questions have been answered and I understand these instructions. I will adhere to these goals and the provided educational materials after my discharge from the hospital.  Patient/Caregiver Signature _______________________________ Date __________  Clinician Signature _______________________________________ Date __________  Please bring this form and your medication list with you to all your follow-up doctor's appointments.

## 2018-03-19 NOTE — Discharge Summary (Signed)
Physician Discharge Summary  Patient ID: JANNAH GUARDIOLA MRN: [id number] DOB/AGE: [birth year] 57 y.o.  Admit date: 03/09/2018 Discharge date: 03/19/2018  Discharge Diagnoses:  Principal Problem:   Lumbar radiculopathy Active Problems:   Spinal stenosis at L4-L5 level   Postoperative pain   Paraparesis (HCC)   Depression with anxiety   AKI (acute kidney injury) ([place])   Benign essential HTN   Discharged Condition: stable   Significant Diagnostic Studies: Mr Jeri Cos Wo Contrast  Result Date: 03/08/2018 CLINICAL DATA:  Lumbar decompression L4-5 03/04/2018. Postop bilateral leg weakness, now improved. Rule out stroke. EXAM: MRI HEAD WITHOUT AND WITH CONTRAST TECHNIQUE: Multiplanar, multiecho pulse sequences of the brain and surrounding structures were obtained without and with intravenous contrast. CONTRAST:  29mL MULTIHANCE GADOBENATE DIMEGLUMINE 529 MG/ML IV SOLN COMPARISON:  None. FINDINGS: Brain: Negative for acute infarct. Scattered small white matter hyperintensities bilaterally mostly in the deep white matter. Left frontal periventricular white matter hyperintensity. Brainstem and cerebellum normal. Negative for hemorrhage or mass. Normal enhancement postcontrast administration. Vascular: Normal arterial flow void Skull and upper cervical spine: Negative Sinuses/Orbits: Negative Other: None IMPRESSION: No acute abnormality. Mild chronic white matter changes likely due to chronic microvascular ischemia. Migraine headaches and demyelinating disease are other possibilities however the pattern is not typical for demyelinating disease. Electronically Signed   By: Franchot Gallo M.D.   On: 03/08/2018 09:57     Mr Thoracic Spine W Wo Contrast  Result Date: 03/08/2018 CLINICAL DATA:  Lumbar laminectomy L4-5 03/04/2018. Postop bilateral leg weakness. Rule out hematoma or cord compression EXAM: MRI TOTAL SPINE WITHOUT AND WITH CONTRAST TECHNIQUE: Multisequence MR imaging of the spine from the  cervical spine to the sacrum was performed prior to and following IV contrast administration for evaluation of spinal metastatic disease. CONTRAST:  38mL MULTIHANCE GADOBENATE DIMEGLUMINE 529 MG/ML IV SOLN COMPARISON:  Lumbar MRI 02/01/2018 FINDINGS: MRI CERVICAL SPINE FINDINGS Alignment: Normal Vertebrae: Normal bone marrow.  Negative for fracture or mass Cord: Spinal cord signal normal. Enhancing vein in the cord at the C1 level without abnormal signal. Possible normal but prominent cord vascularity. Posterior Fossa, vertebral arteries, paraspinal tissues: Negative Disc levels: C2-3: Mild disc degeneration without stenosis C3-4: Mild disc degeneration. Central and left-sided spurring with moderate left foraminal stenosis due to spurring. C4-5: Disc degeneration. Central disc protrusion and associated spurring with cord flattening and mild spinal stenosis. Neural foramina patent bilaterally. C5-6: Small right paracentral disc protrusion. Diffuse uncinate spurring. Cord flattening with moderate spinal stenosis and mild foraminal narrowing bilaterally. C6-7: Central disc protrusion and spurring. Cord flattening with mild spinal stenosis. C7-T1: Negative MRI THORACIC SPINE FINDINGS Alignment:  Normal Vertebrae: Normal bone marrow. Negative for fracture or mass. Normal enhancement. Cord:  Normal cord signal.  No cord compression or cord lesion. Paraspinal and other soft tissues: Negative Disc levels: Multilevel mild thoracic disc degeneration. Mild disc bulging and disc space narrowing and endplate spurring throughout the thoracic spine from T2 through T12. No significant spinal or foraminal encroachment. No large disc protrusion. MRI LUMBAR SPINE FINDINGS Segmentation:  Normal Alignment:  Normal Vertebrae:  Negative for fracture or mass Conus medullaris: Extends to the L2-3 level and appears normal. Low lying conus medullaris unchanged from the recent MRI. Paraspinal and other soft tissues: Postop fluid posteriorly at  L4-5, typical for recent surgery. No mass-effect on the thecal sac. Disc levels: L1-2: Disc degeneration with disc bulging and Schmorl's node. Negative for stenosis L2-3: Disc degeneration with diffuse disc bulging and endplate spurring. Mild spinal  stenosis and mild subarticular stenosis bilaterally unchanged from the prior study. L3-4: Moderate facet hypertrophy. Mild disc degeneration. Small amount of subdural fluid surrounds the thecal sac contributing to moderate spinal stenosis which has progressed in the interval. L4-5: Extensive bilateral laminectomy without spinal stenosis. Mild subarticular stenosis bilaterally. Shallow left foraminal disc protrusion with mild left foraminal encroachment unchanged from the prior study. Small postoperative fluid collection posterior to the thecal sac without mass-effect on the thecal sac L5-S1: Mild facet degeneration without stenosis. IMPRESSION: Multilevel cervical spondylosis. Mild spinal stenosis C4-5 and moderate spinal stenosis C5-6. Mild spinal stenosis C6-7 Multilevel thoracic degenerative change without significant stenosis or cord compression Multilevel lumbar degenerative change. Mild spinal stenosis L2-3. Moderate spinal stenosis L3-4 has progressed due to small amount of subdural fluid surrounding the thecal sac. Postop bilateral laminectomy L4-5 with wide decompression thecal sac. Typical postoperative fluid posterior to the thecal sac without significant hematoma or compression of the thecal sac. Left foraminal disc protrusion at L4-5 unchanged from the prior study with mild left foraminal narrowing. These results were called by telephone at the time of interpretation on 03/08/2018 at 10:26 am to [name] , who verbally acknowledged these results. Electronically Signed   By: Franchot Gallo M.D.   On: 03/08/2018 10:29    Labs:  Basic Metabolic Panel: BMP Latest Ref Rng & Units 03/12/2018 03/10/2018 03/06/2018  Glucose 65 - 99 mg/dL 68 98 118(H)  BUN 6 - 20  mg/dL 15 10 11   Creatinine 0.44 - 1.00 mg/dL 1.12(H) 1.05(H) 0.92  Sodium 135 - 145 mmol/L 140 136 142  Potassium 3.5 - 5.1 mmol/L 4.1 4.1 4.1  Chloride 101 - 111 mmol/L 102 96(L) 107  CO2 22 - 32 mmol/L 32 31 31  Calcium 8.9 - 10.3 mg/dL 8.9 8.5(L) 8.6(L)    CBC: CBC Latest Ref Rng & Units 03/12/2018 03/09/2018 03/06/2018  WBC 4.0 - 10.5 K/uL 5.1 7.7 16.8(H)  Hemoglobin 12.0 - 15.0 g/dL 10.7(L) 10.0(L) 10.0(L)  Hematocrit 36.0 - 46.0 % 33.7(L) 31.8(L) 31.0(L)  Platelets 150 - 400 K/uL 286 236 214    CBG: Recent Labs  Lab 03/15/18 2229 03/16/18 0632  GLUCAP 134* 91    Brief HPI:   RONIYA TETRO is a 57 year old female with history of HA, HTN back injury with microdiskectomy 02/2016 who started radiation of back pain to LLE, falls and difficulty walking. she was found to have severe recurrent lateral stenosis L4/L5. She underwent L4/5 foraminotomy with lysis of epidural fibrosis and revision of hemilaminotomies by Dr. Maxie Better on 03/04/2018.  In PACU, patient reported loss of sensation from waist down with inability to move BLE and she was taken back to OR for exploration of wound with evacuation of hematoma and removal of partial spinous process L4 and L5. She continued to have numbness RLE and neurology was consulted for input. MRI brain and total spine was negative for acute process and Dr. Cheral Marker questioned symptoms due to post op inflammation or "cracked back" from flexion/hyperextension during surgery. EMG/nerve conduction study recommended in 4-6 weeks if patient continues to have symptoms. Patient with functional deficits and CIR was recommended for follow up therapy.    Hospital Course: ANAELLE DUNTON was admitted to rehab 03/09/2018 for inpatient therapies to consist of PT and OT at least three hours five days a week. Past admission physiatrist, therapy team and rehab RN have worked together to provide customized collaborative inpatient rehab.  She has had dizziness due to  orthostatic changes  and coreg was decreased to 6.25 mg bid. Abdominal binder was also ordered to help with BP support. She is continent of B/B and has been afebrile during her stay. Reactive leucocytosis has resolved. Neuropathic pain is improving and  back pian has been managed with prn use of oxycodone. Constipation has been managed with use of miralax. She has had issues with anxiety and ego support has been provided by team as well as neuropsychologist. Dr. Sima Matas has worked with patient on coping to help deal with anxiety/depressive response to acute illness.  Low dose ativan has been effective and anxiety levels have greatly improved by discharge. Back incision healing well without signs or symptoms of infection. She has made steady progress and is at supervision level. She will continue to receive follow up HHPT and [facility] after discharge.    Rehab course: During patient's stay in rehab weekly team conferences were held to monitor patient's progress, set goals and discuss barriers to discharge. At admission, patient required min assist with mobility and basic self care tasks.  She  has had improvement in activity tolerance, balance, postural control as well as ability to compensate for deficits. She requires supervision for bathing and dressing with min assist to don right TED and AFO. She requires supervision for transfers and is able to ambulate 76' with supervision.     Disposition:  Home  Diet: Regular.   Special Instructions: 1. Needs supervision with all activity. 2. NO bending, twisting, arching or lifting items over 5 lbs.  Discharge Instructions    Ambulatory referral to Physical Medicine Rehab   Complete by:  As directed    1-2 weeks transitional care appt     Allergies as of 03/19/2018      Reactions   Cephalosporins Anaphylaxis   Penicillins Anaphylaxis, Hives   Has patient had a PCN reaction causing immediate rash, facial/tongue/throat swelling, SOB  or lightheadedness with hypotension: Yes Has patient had a PCN reaction causing severe rash involving mucus membranes or skin necrosis: Yes Has patient had a PCN reaction that required hospitalization Yes Has patient had a PCN reaction occurring within the last 10 years: No If all of the above answers are "NO", then may proceed with Cephalosporin use.   Anesthetics, Amide Nausea And Vomiting   Does not know name of it. States they put it on record foot center.    Peach [prunus Persica] Hives   Latex Hives, Itching, Rash      Medication List    STOP taking these medications   doxycycline 100 MG capsule Commonly known as:  VIBRAMYCIN   EPINEPHrine 0.3 mg/0.3 mL Soaj injection Commonly known as:  EPI-PEN   LORazepam 0.5 MG tablet Commonly known as:  ATIVAN   oxyCODONE-acetaminophen 5-325 MG tablet Commonly known as:  PERCOCET/ROXICET     TAKE these medications   acetaminophen 325 MG tablet Commonly known as:  TYLENOL Take 1-2 tablets (325-650 mg total) by mouth every 4 (four) hours as needed for mild pain.   acidophilus Caps capsule Take 1 capsule by mouth daily.   ALPRAZolam 0.25 MG tablet--Rx # 5 pills Commonly known as:  XANAX Take 1 tablet (0.25 mg total) by mouth daily as needed for anxiety or sleep.   carvedilol 6.25 MG tablet Commonly known as:  COREG Take 1 tablet (6.25 mg total) by mouth 2 (two) times daily. What changed:    medication strength  how much to take   docusate sodium 100 MG capsule Commonly  known as:  COLACE Take 1 capsule (100 mg total) by mouth 2 (two) times daily.   gabapentin 300 MG capsule Commonly known as:  NEURONTIN Take 1 capsule (300 mg total) by mouth 3 (three) times daily.   methocarbamol 500 MG tablet Commonly known as:  ROBAXIN Take 1 tablet (500 mg total) by mouth every 6 (six) hours as needed for muscle spasms. What changed:    reasons to take this  additional instructions   oxyCODONE 5 MG immediate release tablet--Rx  # 7 pills  Commonly known as:  Oxy IR/ROXICODONE Take 1 tablet (5 mg total) by mouth every 12 (twelve) hours as needed for severe pain.   polyethylene glycol packet Commonly known as:  MIRALAX / GLYCOLAX Take 17 g by mouth daily.   PREMARIN 0.625 MG tablet Generic drug:  estrogens (conjugated) Take 0.625 mg by mouth daily.   traMADol 50 MG tablet--Rx # 21 pills  Commonly known as:  ULTRAM Take 1 tablet (50 mg total) by mouth every 6 (six) hours as needed for moderate pain.      Follow-up Information    Meredith Staggers, MD Follow up.   Specialty:  Physical Medicine and Rehabilitation Why:  office will call you with follow up appointment Contact information: [address] [place] [address] [phone]        [name], MD. Call in 1 day(s).   Specialty:  Orthopedic Surgery Why:  for follow up appointment Contact information: [address] [place] [address] [phone]        Velna Hatchet, MD. Call in 1 day(s).   Specialty:  Internal Medicine Why:  for post hospital follow up in 1-2 weeks Contact information: [address] [name] [address] [phone]           Signed: Bary Leriche 03/22/2018, 5:44 PM

## 2018-03-19 NOTE — Progress Notes (Signed)
[facility] PHYSICAL MEDICINE & REHABILITATION     PROGRESS NOTE    Subjective/Complaints: Pt seen sitting up in her chair this AM.  She states she slept well overnight.  She is ready for discharge.  She has questions regarding showering.   ROS: Denies CP, SOB, N/V/D  Objective:  No results found. No results for input(s): WBC, HGB, HCT, PLT in the last 72 hours. No results for input(s): NA, K, CL, GLUCOSE, BUN, CREATININE, CALCIUM in the last 72 hours.  Invalid input(s): CO CBG (last 3)  No results for input(s): GLUCAP in the last 72 hours.  Wt Readings from Last 3 Encounters:  03/17/18 73.2 kg (161 lb 6 oz)  03/04/18 65 kg (143 lb 4.8 oz)  02/23/18 64.6 kg (142 lb 8 oz)     Intake/Output Summary (Last 24 hours) at 03/19/2018 1118 Last data filed at 03/18/2018 1700 Gross per 24 hour  Intake 480 ml  Output -  Net 480 ml    Vital Signs: Blood pressure 132/86, pulse 80, temperature 97.7 F (36.5 C), temperature source Oral, resp. rate 18, height 5\' 5"  (1.651 m), weight 73.2 kg (161 lb 6 oz), SpO2 100 %. Physical Exam:  Constitutional: No distress . Vital signs reviewed. Well-developed.  HENT: Normocephalic, atraumatic. Eyes: EOMI. No discharge.  Cardiovascular: RRR. No JVD    Respiratory: CTA Bilaterally. Normal effort    GI: BS +, non-distended   Musculoskeletal:No edema or tenderness in extremities Neurological: She isalertand oriented Motor: B/l UE 4/5 prox to distal.  LLE: 3/5 hf, 3/5 ke 4/5 adf/pf.  RLE: 1/5 hf, ke, adf/pf +Hoovers still positive.   Decreased sensation left foot  Psychiatric:pleasant and smiling. Skin: Back incision c/d/i  Assessment/Plan: 1. Functional and mobility deficits secondary to multiple lumbar radiculopathies which require 3+ hours per day of interdisciplinary therapy in a comprehensive inpatient rehab setting. Physiatrist is providing close team supervision and 24 hour management of active medical problems listed  below. Physiatrist and rehab team continue to assess barriers to discharge/monitor patient progress toward functional and medical goals.  Function:  Bathing Bathing position Bathing activity did not occur: Refused Position: Shower  Bathing parts Body parts bathed by patient: Right arm, Left arm, Chest, Abdomen, Left lower leg, Right lower leg, Front perineal area, Right upper leg, Left upper leg, Buttocks Body parts bathed by helper: Back  Bathing assist Assist Level: Supervision or verbal cues Assistive Device Comment: LH sponge Set up : To obtain items  Upper Body Dressing/Undressing Upper body dressing Upper body dressing/undressing activity did not occur: Refused What is the patient wearing?: Bra, Pull over shirt/dress Bra - Perfomed by patient: Thread/unthread right bra strap, Thread/unthread left bra strap, Hook/unhook bra (pull down sports bra)   Pull over shirt/dress - Perfomed by patient: Thread/unthread right sleeve, Put head through opening, Thread/unthread left sleeve, Pull shirt over trunk          Upper body assist Assist Level: More than reasonable time   Set up : To obtain clothing/put away  Lower Body Dressing/Undressing Lower body dressing Lower body dressing/undressing activity did not occur: Refused What is the patient wearing?: Underwear, Pants, Shoes, [employer]  - Performed by patient: Thread/unthread right underwear leg, Thread/unthread left underwear leg, Pull underwear up/down   Pants- Performed by patient: Thread/unthread right pants leg, Thread/unthread left pants leg, Pull pants up/down   Non-skid slipper socks- Performed by patient: Don/doff right sock, Don/doff left sock Non-skid slipper socks- Performed by helper: Don/doff right sock, Don/doff left  sock     Shoes - Performed by patient: Don/doff left shoe Shoes - Performed by helper: Don/doff left shoe, Fasten right, Fasten left   AFO - Performed by helper: Don/doff right AFO TED Hose -  Performed by patient: Don/doff left TED hose TED Hose - Performed by helper: Don/doff right TED hose  Lower body assist Assist for lower body dressing: Touching or steadying assistance (Pt > 75%) Assistive Device Comment: Engineer, maintenance (IT)   Toileting steps completed by patient: Performs perineal hygiene, Adjust clothing after toileting, Adjust clothing prior to toileting Toileting steps completed by helper: Adjust clothing prior to toileting, Adjust clothing after toileting Toileting Assistive Devices: Grab bar or rail  Toileting assist Assist level: Supervision or verbal cues   Transfers Chair/bed transfer   Chair/bed transfer method: Stand pivot Chair/bed transfer assist level: Supervision or verbal cues Chair/bed transfer assistive device: Armrests, Medical sales representative     Max distance: 50' Assist level: Supervision or verbal cues   Wheelchair   Type: Manual Max wheelchair distance: 150' Assist Level: Supervision or verbal cues  Cognition Comprehension Comprehension assist level: Follows complex conversation/direction with no assist  Expression Expression assist level: Expresses complex ideas: With no assist  Social Interaction Social Interaction assist level: Interacts appropriately with others - No medications needed.  Problem Solving Problem solving assist level: Solves complex problems: Recognizes & self-corrects  Memory Memory assist level: Complete Independence: No helper  Medical Problem List and Plan: 1.Functional and mobility deficitssecondary to lumbar stenosis with multiple-level radiculopathies s/p Decompression and re-do. Some of weakness remains non-organic.  D/c today  -Patient to see Rehab MD/provider in the office for transitional care encounter in 1-2 weeks.  2. DVT Prophylaxis/Anticoagulation: Pharmaceutical:Lovenox--dc at discharge 3. Pain Management:Oxycodoneand robaxin prn effective. On gabapentin tid for neuropathic  pain.--fair control at present 4. Mood:providing daily positive reinforcement.  5. Neuropsych: This patientiscapable of making decisions on herown behalf. 6. Skin/Wound Care:Removed staples  7. Fluids/Electrolytes/Nutrition:encourage fluids/po 8. HTN: Monitor BP bid--continue to coreg bid.   -controlled 5/24  -binder to prevent orthostasis   -reduced coreg to 6.25mg  bid 9. ABLA: hgb stable  at 10.7 on 5/17 10. Leucocytosis:Resolved 11. Constipation:Improved--continue Miralax. 12. Anxiety disorder: improved overall  -team to continue providing positive reinforcement  -improving mood with improving functional abilities 13. AKI  Encourage fluids  Cont to monitor      LOS (Days) 10 A FACE TO FACE EVALUATION WAS PERFORMED  Sharrie Self Lorie Phenix, MD 03/19/2018 11:18 AM

## 2018-03-22 NOTE — Progress Notes (Signed)
Social Work  Discharge Note  The overall goal for the admission was met for:   Discharge location: Yes - home with spouse who is able to provide needed assistance.  Length of Stay: Yes - 10 days  Discharge activity level: Yes - supervision to min assist  Home/community participation: Yes  Services provided included: MD, RD, PT, OT, RN, TR, Pharmacy, Neuropsych and SW  Financial Services: Private Insurance: [employer]  Follow-up services arranged: Home Health: PT, OT via [facility], DME: 18x18 lightweight w/c with ELRs, cushion via [facility] (had already received bsc and rw on acute) and Patient/Family has no preference for HH/DME agencies  Comments (or additional information):  Patient/Family verbalized understanding of follow-up arrangements: Yes  Individual responsible for coordination of the follow-up plan: pt  Confirmed correct DME delivered: Zedric Deroy 03/22/2018    Earon Rivest

## 2018-03-23 ENCOUNTER — Telehealth: Payer: Self-pay | Admitting: *Deleted

## 2018-03-23 ENCOUNTER — Other Ambulatory Visit: Payer: Self-pay | Admitting: *Deleted

## 2018-03-23 NOTE — Patient Outreach (Signed)
[facility]) Care Management  03/23/2018  [name] 28-Feb-1961 [id number]   Subjective: Telephone call to patient's home / mobile number, spoke with patient, and HIPAA verified.  Discussed [facility] Care Management [facility] Transition of care follow up, patient voiced understanding, and is in agreement to follow up.    Patient states she is doing better, very frustrated with the insurance company, and [place] regarding home health services.   States she was informed by Art gallery manager , per their insurance verification process she has not met out of pocket max or deductible, and she would have to pay coinsurance of $40 per visit versus $0.  States she is following up with both [facility] and [facility] (insurance) to resolve the issues, so she can start receiving services this week.  States has submitted documentation to [facility] today regarding services render for the past 2 hospital admissions. RNCM verbally gave patient contact number for [facility] Patient Accounting [phone] to request itemized bill.   RNCM advised patient may also follow up with [facility] [phone]) to verify claims, out of pocket, and deductible status.  Patient voices understanding, status she will follow up with [facility], [facility], Patient Accounting, and Delana Ramey if needed.   States she does not need any assistance from this RNCM at this time and will notify RNCM if assistance needed in the future.   Patient states she has received daily phone calls from surgeon checking on her, is appreciative of providers care,  and may be able to receive outpatient physical therapy in his office if needed.  Patient aware of coinsurance benefits at a [facility] facility versus non [facility] facility and will work with provider to obtain services if needed.   Patient states she has a follow up appointment with rehab MD on 04/01/18 and surgeon on 03/30/18. Patient voices understanding of  medical diagnosis, surgery,  and treatment plan.   Patient states she is able to manage self care and has assistance as needed with activities of daily living / home management.  States she is accessing the following [facility] benefits: outpatient pharmacy, hospital indemnity, and has family medical leave act (FMLA) in place.  Patient has obtained medical records and submitted information for hospital indemnity claim.  Patient states she does not have any education material, transition of care, care coordination, disease management, disease monitoring, transportation, community resource, or pharmacy needs at this time.  States she is very appreciative of the follow up and is in agreement to receive [facility] information.      Objective: Per KPN (Knowledge Performance Now, point of care tool) and chart review,  Patient hospitalized in acute inpatient rehab 03/09/18 -03/19/18 for lumbar radiculopathy Spinal stenosis at L4-L5 level, Paraparesis, Postoperative pain,and  AKI (acute kidney injury).   Patient hospitalized 03/04/18 -03/09/18 for spinal stenosis, status post Revision Microlumbar decompression L4-5 bilateral, and EXPLORATION OF LUMBAR DECOMPRESSION WOUND on 03/04/18.    Patient also has a history of hypertension.      Assessment:  Received [facility] Transition of care referral on 03/09/18.   Transition of care follow up completed, no care management needs, and will proceed with case closure.        Plan: RNCM will send patient successful outreach letter, [facility] pamphlet, and magnet. RNCM will complete case closure due to follow up completed / no care management needs.       Brayla Pat H. Burt Knack Therapist, sports, BSN, [place] Telephonic  CM Phone: [phone] Fax: [phone]

## 2018-03-23 NOTE — Telephone Encounter (Signed)
Transitional Care call-I spoke with Mrs Fundora.    1. Are you/is patient experiencing any problems since coming home? Are there any questions regarding any aspect of care?NO 2. Are there any questions regarding medications administration/dosing? Are meds being taken as prescribed? Patient should review meds with caller to confirm. Yes she has all medications on discharge paper. 3. Have there been any falls? NO 4. Has Home Health been to the house and/or have they contacted you? If not, have you tried to contact them? Can we help you contact them?They have contacted her. There was a problem with insurance and it showed she had a $40 copay with every visit.  They have been on phone with insurance all morning and her deductible should have been met with hospitalization and should have no copay.  Now they just have to make arrangements to start therapy.  Her husband has been helping her in the morning with what she was shown in the hospital.  She still has some issues with feeling in her toes. 5. Are bowels and bladder emptying properly? Are there any unexpected incontinence issues? If applicable, is patient following bowel/bladder programs?Bowels were not working well at first but she has taken the Miralax ordered and it has worked well.  No issues with urine. 6. Any fevers, problems with breathing, unexpected pain? No just sore. 7. Are there any skin problems or new areas of breakdown?No 8. Has the patient/family member arranged specialty MD follow up (ie cardiology/neurology/renal/surgical/etc)?  Can we help arrange? Appt given today to come in next week to see Zella Ball NP and then see Dr Naaman Plummer following month. 9. Does the patient need any other services or support that we can help arrange? NO 10. Are caregivers following through as expected in assisting the patient? Yes 11. Has the patient quit smoking, drinking alcohol, or using drugs as recommended? n/a  Appointment Thursday 04/01/18 at 11:20  arrive by 11:00 to see Danella Sensing NP and then see Dr Naaman Plummer in July. Address reviewed. Tensed

## 2018-03-30 DIAGNOSIS — M48061 Spinal stenosis, lumbar region without neurogenic claudication: Secondary | ICD-10-CM | POA: Diagnosis not present

## 2018-03-30 DIAGNOSIS — R531 Weakness: Secondary | ICD-10-CM | POA: Diagnosis not present

## 2018-03-30 DIAGNOSIS — Z981 Arthrodesis status: Secondary | ICD-10-CM | POA: Diagnosis not present

## 2018-03-30 DIAGNOSIS — R296 Repeated falls: Secondary | ICD-10-CM | POA: Diagnosis not present

## 2018-03-30 DIAGNOSIS — Z4789 Encounter for other orthopedic aftercare: Secondary | ICD-10-CM | POA: Diagnosis not present

## 2018-03-30 DIAGNOSIS — I10 Essential (primary) hypertension: Secondary | ICD-10-CM | POA: Diagnosis not present

## 2018-03-30 DIAGNOSIS — F419 Anxiety disorder, unspecified: Secondary | ICD-10-CM | POA: Diagnosis not present

## 2018-03-30 MED FILL — OXYCODONE-ACETAMINOPHEN 5-3: 5-325 | 7 days supply | Qty: 21 | Fill #0

## 2018-04-01 ENCOUNTER — Other Ambulatory Visit: Payer: Self-pay

## 2018-04-01 ENCOUNTER — Encounter: Payer: 59 | Attending: Registered Nurse | Admitting: Registered Nurse

## 2018-04-01 ENCOUNTER — Encounter: Payer: Self-pay | Admitting: Registered Nurse

## 2018-04-01 VITALS — BP 128/84 | HR 77 | Ht 65.0 in | Wt 149.2 lb

## 2018-04-01 DIAGNOSIS — Z809 Family history of malignant neoplasm, unspecified: Secondary | ICD-10-CM | POA: Diagnosis not present

## 2018-04-01 DIAGNOSIS — F419 Anxiety disorder, unspecified: Secondary | ICD-10-CM | POA: Insufficient documentation

## 2018-04-01 DIAGNOSIS — I10 Essential (primary) hypertension: Secondary | ICD-10-CM | POA: Insufficient documentation

## 2018-04-01 DIAGNOSIS — Z90710 Acquired absence of both cervix and uterus: Secondary | ICD-10-CM | POA: Diagnosis not present

## 2018-04-01 DIAGNOSIS — F411 Generalized anxiety disorder: Secondary | ICD-10-CM

## 2018-04-01 DIAGNOSIS — Z801 Family history of malignant neoplasm of trachea, bronchus and lung: Secondary | ICD-10-CM | POA: Diagnosis not present

## 2018-04-01 DIAGNOSIS — Z807 Family history of other malignant neoplasms of lymphoid, hematopoietic and related tissues: Secondary | ICD-10-CM | POA: Insufficient documentation

## 2018-04-01 DIAGNOSIS — M5416 Radiculopathy, lumbar region: Secondary | ICD-10-CM | POA: Insufficient documentation

## 2018-04-01 DIAGNOSIS — Z808 Family history of malignant neoplasm of other organs or systems: Secondary | ICD-10-CM | POA: Insufficient documentation

## 2018-04-01 DIAGNOSIS — Z803 Family history of malignant neoplasm of breast: Secondary | ICD-10-CM | POA: Insufficient documentation

## 2018-04-01 DIAGNOSIS — M62838 Other muscle spasm: Secondary | ICD-10-CM | POA: Insufficient documentation

## 2018-04-01 DIAGNOSIS — Z800 Family history of malignant neoplasm of digestive organs: Secondary | ICD-10-CM | POA: Diagnosis not present

## 2018-04-01 DIAGNOSIS — Z09 Encounter for follow-up examination after completed treatment for conditions other than malignant neoplasm: Secondary | ICD-10-CM | POA: Diagnosis not present

## 2018-04-01 DIAGNOSIS — Z9889 Other specified postprocedural states: Secondary | ICD-10-CM | POA: Diagnosis not present

## 2018-04-01 NOTE — Progress Notes (Signed)
Subjective:    Patient ID: [name], female    DOB: May 29, 1961, 57 y.o.   MRN: [id number]  HPI: [name] is a 57 year old female who is here for transitional care visit in follow up of her lumbar laminectomy decompression, lumbar radiculopathy, hypertension, muscle spasm and anxiety. She states her pain is located in her lower back. She rates her pain 4. She has been home with home health therapies from [place] she's receiving Physical and Occupational Therapy.   Also reports she has a fair appetite.  [name] receiving her Oxycodone from [name], her Morphine Equivalent is 22.50 MME. She is also prescribed Alprazolam .We have discussed the black box warning of using opioids and benzodiazepines.  I highlighted the dangers of using these drugs together and discussed the adverse events including respiratory suppression, overdose, cognitive impairment and importance of compliance with current regimen. We will continue to monitor and adjust as indicated.   Pain Inventory Average Pain 4 Pain Right Now 4 My pain is not described  In the last 24 hours, has pain interfered with the following? General activity 2 Relation with others 2 Enjoyment of life 2 What TIME of day is your pain at its worst? daytime Sleep (in general) Fair  Pain is worse with: some activites Pain improves with: medication Relief from Meds: 6  Mobility walk with assistance use a cane  Function I need assistance with the following:  bathing, meal prep, household duties and shopping  Neuro/Psych trouble walking dizziness anxiety  Prior Studies Any changes since last visit?  no  Physicians involved in your care Primary care needs to get new one Orthopedist Dr Tonita Cong , saw this week.   Family History  Problem Relation Age of Onset  . Lung cancer Father   . Pancreatic cancer Sister   . Breast cancer Sister 47  . Throat cancer Brother   . Cancer Sister   .  Multiple myeloma Sister   . Breast cancer Sister        diagnosed in her 9's  . Stomach cancer Cousin   . Colon cancer Neg Hx    Social History   Socioeconomic History  . Marital status: Married    Spouse name: Not on file  . Number of children: Not on file  . Years of education: Not on file  . Highest education level: Not on file  Occupational History  . Not on file  Social Needs  . Financial resource strain: Not on file  . Food insecurity:    Worry: Not on file    Inability: Not on file  . Transportation needs:    Medical: Not on file    Non-medical: Not on file  Tobacco Use  . Smoking status: Never Smoker  . Smokeless tobacco: Never Used  Substance and Sexual Activity  . Alcohol use: No  . Drug use: No  . Sexual activity: Not on file  Lifestyle  . Physical activity:    Days per week: Not on file    Minutes per session: Not on file  . Stress: Not on file  Relationships  . Social connections:    Talks on phone: Not on file    Gets together: Not on file    Attends religious service: Not on file    Active member of club or organization: Not on file    Attends meetings of clubs or organizations: Not on file    Relationship status: Not  on file  Other Topics Concern  . Not on file  Social History Narrative  . Not on file   Past Surgical History:  Procedure Laterality Date  . ABDOMINAL HYSTERECTOMY    . CARDIAC CATHETERIZATION N/A 04/18/2015   Procedure: Left Heart Cath and Coronary Angiography;  Surgeon: [name], MD;  Location: [facility];  Service: Cardiovascular;  Laterality: N/A;  . FOOT SURGERY Bilateral    [place] "bunion,bone spur, tendon" (1) -6'16, (1)-10'16  . LUMBAR LAMINECTOMY/DECOMPRESSION MICRODISCECTOMY Bilateral 12/28/2015   Procedure: MICRO LUMBAR DECOMPRESSION L4 - L5 BILATERALLY;  Surgeon: [name], MD;  Location: [facility];  Service: Orthopedics;  Laterality: Bilateral;  . LUMBAR LAMINECTOMY/DECOMPRESSION MICRODISCECTOMY  Bilateral 03/04/2018   Procedure: Revision of Microlumbar Decompression Bilateral Lumbar Four-Five;  Surgeon: [name], MD;  Location: [facility];  Service: Orthopedics;  Laterality: Bilateral;  90 mins  . WISDOM TOOTH EXTRACTION    . WOUND EXPLORATION N/A 03/04/2018   Procedure: EXPLORATION OF LUMBAR DECOMPRESSION WOUND;  Surgeon: [name], MD;  Location: [facility];  Service: Orthopedics;  Laterality: N/A;   Past Medical History:  Diagnosis Date  . Anxiety   . Back pain    related to spinal stenosis and disc problem, radiates down left buttocks to leg., weakness occ.  [name] Headache   . Hypertension   . PONV (postoperative nausea and vomiting)   . Vaginal foreign object    "Uses Femring"   BP 128/84   Pulse 77   Ht '5\' 5"'$  (1.651 m) Comment: pt reported  Wt 149 lb 3.2 oz (67.7 kg)   SpO2 98%   BMI 24.83 kg/m   Opioid Risk Score:   Fall Risk Score:  `1  Depression screen PHQ 2/9  Depression screen PHQ 2/9 04/01/2018  Decreased Interest 1  Down, Depressed, Hopeless 1  PHQ - 2 Score 2  Altered sleeping 1  Tired, decreased energy 1  Change in appetite 1  Feeling bad or failure about yourself  0  Trouble concentrating 0  Moving slowly or fidgety/restless 0  Suicidal thoughts 0  PHQ-9 Score 5  Difficult doing work/chores Somewhat difficult   Review of Systems  Constitutional: Negative.   HENT: Negative.   Eyes: Negative.   Respiratory: Negative.   Cardiovascular: Negative.   Gastrointestinal: Positive for nausea.  Endocrine: Negative.   Genitourinary: Negative.   Musculoskeletal: Positive for gait problem.  Skin: Negative.   Neurological: Positive for dizziness.  Hematological: Negative.   Psychiatric/Behavioral: The patient is nervous/anxious.   All other systems reviewed and are negative.      Objective:   Physical Exam  Constitutional: She is oriented to person, place, and time. She appears well-developed and well-nourished.  HENT:  Head: Normocephalic and  atraumatic.  Neck: Normal range of motion. Neck supple.  Cardiovascular: Normal rate and regular rhythm.  Pulmonary/Chest: Effort normal and breath sounds normal.  Musculoskeletal:  Normal Muscle Bulk and Muscle Testing Reveals: Upper Extremities: Full ROM and Muscle Strength 5/5 Lumbar Paraspinal Tenderness: L-3-L-5 Lower Extremities: Right: Decreased ROM and Muscle Strength 4/5  Wearing Right AFO Left: Full ROM and Muscle Strength 5/5 Arises from Table Slowly using Cane for support.  Gait slow and steady using cane for support.   Neurological: She is alert and oriented to person, place, and time.  Skin: Skin is warm and dry.  Psychiatric: She has a normal mood and affect.  Nursing note and vitals reviewed.         Assessment &  Plan:  1. Post Lumbar Laminectomy / Decompression: Lumbar Radiculopathy: [name] Following. Continue Gabapentin. Continue to Monitor.  2. Hypertension: Continue current medication regimen. PCP Following.  3. Muscle Spasm: Continue current medication regimen. Continue to monitor.  4. Anxiety. Continue Alprazolam: Education Provided. Continue to Monitor.   20 minutes minutes of face to face patient care time was spent during this visit. All questions were encouraged and answered.  F/U with Dr. Naaman Plummer in 4-6 weeks.

## 2018-04-02 DIAGNOSIS — I10 Essential (primary) hypertension: Secondary | ICD-10-CM | POA: Diagnosis not present

## 2018-04-02 DIAGNOSIS — Z981 Arthrodesis status: Secondary | ICD-10-CM | POA: Diagnosis not present

## 2018-04-02 DIAGNOSIS — M48061 Spinal stenosis, lumbar region without neurogenic claudication: Secondary | ICD-10-CM | POA: Diagnosis not present

## 2018-04-02 DIAGNOSIS — Z4789 Encounter for other orthopedic aftercare: Secondary | ICD-10-CM | POA: Diagnosis not present

## 2018-04-02 DIAGNOSIS — R296 Repeated falls: Secondary | ICD-10-CM | POA: Diagnosis not present

## 2018-04-02 DIAGNOSIS — F419 Anxiety disorder, unspecified: Secondary | ICD-10-CM | POA: Diagnosis not present

## 2018-04-02 DIAGNOSIS — R531 Weakness: Secondary | ICD-10-CM | POA: Diagnosis not present

## 2018-04-06 DIAGNOSIS — R531 Weakness: Secondary | ICD-10-CM | POA: Diagnosis not present

## 2018-04-06 DIAGNOSIS — F419 Anxiety disorder, unspecified: Secondary | ICD-10-CM | POA: Diagnosis not present

## 2018-04-06 DIAGNOSIS — Z981 Arthrodesis status: Secondary | ICD-10-CM | POA: Diagnosis not present

## 2018-04-06 DIAGNOSIS — M48061 Spinal stenosis, lumbar region without neurogenic claudication: Secondary | ICD-10-CM | POA: Diagnosis not present

## 2018-04-06 DIAGNOSIS — I10 Essential (primary) hypertension: Secondary | ICD-10-CM | POA: Diagnosis not present

## 2018-04-06 DIAGNOSIS — Z4789 Encounter for other orthopedic aftercare: Secondary | ICD-10-CM | POA: Diagnosis not present

## 2018-04-06 DIAGNOSIS — R296 Repeated falls: Secondary | ICD-10-CM | POA: Diagnosis not present

## 2018-04-08 DIAGNOSIS — I10 Essential (primary) hypertension: Secondary | ICD-10-CM | POA: Diagnosis not present

## 2018-04-08 DIAGNOSIS — R296 Repeated falls: Secondary | ICD-10-CM | POA: Diagnosis not present

## 2018-04-08 DIAGNOSIS — M48061 Spinal stenosis, lumbar region without neurogenic claudication: Secondary | ICD-10-CM | POA: Diagnosis not present

## 2018-04-08 DIAGNOSIS — R531 Weakness: Secondary | ICD-10-CM | POA: Diagnosis not present

## 2018-04-08 DIAGNOSIS — Z4789 Encounter for other orthopedic aftercare: Secondary | ICD-10-CM | POA: Diagnosis not present

## 2018-04-08 DIAGNOSIS — Z981 Arthrodesis status: Secondary | ICD-10-CM | POA: Diagnosis not present

## 2018-04-08 DIAGNOSIS — F419 Anxiety disorder, unspecified: Secondary | ICD-10-CM | POA: Diagnosis not present

## 2018-04-13 DIAGNOSIS — F419 Anxiety disorder, unspecified: Secondary | ICD-10-CM | POA: Diagnosis not present

## 2018-04-13 DIAGNOSIS — I10 Essential (primary) hypertension: Secondary | ICD-10-CM | POA: Diagnosis not present

## 2018-04-13 DIAGNOSIS — R531 Weakness: Secondary | ICD-10-CM | POA: Diagnosis not present

## 2018-04-13 DIAGNOSIS — Z981 Arthrodesis status: Secondary | ICD-10-CM | POA: Diagnosis not present

## 2018-04-13 DIAGNOSIS — R296 Repeated falls: Secondary | ICD-10-CM | POA: Diagnosis not present

## 2018-04-13 DIAGNOSIS — Z4789 Encounter for other orthopedic aftercare: Secondary | ICD-10-CM | POA: Diagnosis not present

## 2018-04-13 DIAGNOSIS — M48061 Spinal stenosis, lumbar region without neurogenic claudication: Secondary | ICD-10-CM | POA: Diagnosis not present

## 2018-04-13 MED FILL — PREMARIN 0.625 MG TABLET: 0.625 | 30 days supply | Qty: 30 | Fill #2

## 2018-04-16 ENCOUNTER — Ambulatory Visit: Payer: 59 | Admitting: Family Medicine

## 2018-04-16 ENCOUNTER — Encounter: Payer: Self-pay | Admitting: Family Medicine

## 2018-04-16 VITALS — BP 124/83 | HR 82 | Temp 98.2°F | Resp 12 | Ht 65.0 in | Wt 152.1 lb

## 2018-04-16 DIAGNOSIS — F410 Panic disorder [episodic paroxysmal anxiety] without agoraphobia: Secondary | ICD-10-CM | POA: Diagnosis not present

## 2018-04-16 DIAGNOSIS — M48061 Spinal stenosis, lumbar region without neurogenic claudication: Secondary | ICD-10-CM | POA: Diagnosis not present

## 2018-04-16 DIAGNOSIS — I10 Essential (primary) hypertension: Secondary | ICD-10-CM

## 2018-04-16 DIAGNOSIS — F411 Generalized anxiety disorder: Secondary | ICD-10-CM | POA: Insufficient documentation

## 2018-04-16 DIAGNOSIS — E785 Hyperlipidemia, unspecified: Secondary | ICD-10-CM | POA: Diagnosis not present

## 2018-04-16 NOTE — Patient Instructions (Addendum)
A few things to remember from today's visit:   Essential hypertension  Hyperlipidemia, unspecified hyperlipidemia type - Plan: Lipid panel  Generalized anxiety disorder with panic attacks  No changes today.  Monitor blood pressure.   Please be sure medication list is accurate. If a new problem present, please set up appointment sooner than planned today.

## 2018-04-16 NOTE — Progress Notes (Addendum)
HPI:   [name] is a 57 y.o. female, who is here today to establish care.  Former PCP: Velna Hatchet Last preventive routine visit: 05/2017.  Chronic medical problems: Anxiety,HTN,HLD,lumbar spinal stenosis.  Recently hospitalized for complications after revision hemilaminectomies on 03/04/18. She reported lost of sensation and weakness of both LE's. She was taking to the OR for exploration of wound , underwent evacuation of hematoma. Numbness and weakness improved.  She is using a cane for transfer, still mild numbness of LLE. No urine or bowel incontinence.  She is omn Gabapentin 300 mg tid as well as Tramadol and Percocet for post surgical pain management.  Anxiety: She is on Alprazolam 0.25 mg daily as needed. States that she has taken other meds but this is the one that helps the most.   Concerns today: Low BP when she was in the hospital . She had episode of mild dizziness yesterday while she was doing PT. She did not check BP at this time.  HTN Dx 5+ years ago. Current;y she is on Coreg 6.25 mg bid. She is not checking BP at home but it has been checked during PT and in normal range.  Denies severe/frequent headache, visual changes, chest pain, dyspnea, palpitation, claudication, or edema.  Last eye exam 1.5 year ago.  Until recent surgery she was exercising regularly and following a healthy diet.  HLD: She is on non pharmacologic treatment. Last FLP in 01/2017.  TC 253,HDL 57,TG65,and LDL 183.  No Hx of DM, glucose elevated at 134 on 03/15/2018. Lab Results  Component Value Date   HGBA1C 5.5 03/06/2018     Review of Systems  Constitutional: Negative for activity change, appetite change, fatigue, fever and unexpected weight change.  HENT: Negative for mouth sores, nosebleeds and trouble swallowing.   Eyes: Negative for redness and visual disturbance.  Respiratory: Negative for cough, shortness of breath and wheezing.   Cardiovascular:  Negative for chest pain, palpitations and leg swelling.  Gastrointestinal: Negative for abdominal pain, nausea and vomiting.       Negative for changes in bowel habits.  Genitourinary: Negative for decreased urine volume, difficulty urinating, dysuria and hematuria.  Musculoskeletal: Positive for back pain and gait problem.  Skin: Negative for rash.  Neurological: Positive for light-headedness and numbness. Negative for seizures, syncope, weakness and headaches.  Psychiatric/Behavioral: Negative for confusion. The patient is nervous/anxious.       Current Outpatient Medications on File Prior to Visit  Medication Sig Dispense Refill  . acetaminophen (TYLENOL) 325 MG tablet Take 1-2 tablets (325-650 mg total) by mouth every 4 (four) hours as needed for mild pain.    [name] acidophilus (RISAQUAD) CAPS capsule Take 1 capsule by mouth daily.    [name] ALPRAZolam (XANAX) 0.25 MG tablet Take 1 tablet (0.25 mg total) by mouth daily as needed for anxiety or sleep. 5 tablet 0  . carvedilol (COREG) 6.25 MG tablet Take 1 tablet (6.25 mg total) by mouth 2 (two) times daily. 60 tablet 0  . docusate sodium (COLACE) 100 MG capsule Take 1 capsule (100 mg total) by mouth 2 (two) times daily. 60 capsule 1  . EPINEPHrine 0.3 mg/0.3 mL IJ SOAJ injection epinephrine 0.3 mg/0.3 mL injection, auto-injector   0.3 mg by injection route.    . gabapentin (NEURONTIN) 300 MG capsule Take 1 capsule (300 mg total) by mouth 3 (three) times daily. 90 capsule 0  . LORazepam (ATIVAN) 0.5 MG tablet lorazepam 0.5 mg tablet  0.5 mg by oral route.    . methocarbamol (ROBAXIN) 500 MG tablet Take 1 tablet (500 mg total) by mouth every 6 (six) hours as needed for muscle spasms. 120 tablet 0  . oxyCODONE-acetaminophen (PERCOCET/ROXICET) 5-325 MG tablet Take 1 tablet by mouth every 8 (eight) hours as needed.  0  . polyethylene glycol (MIRALAX / GLYCOLAX) packet Take 17 g by mouth daily. 30 each 0  . PREMARIN 0.625 MG tablet Take 0.625 mg by  mouth daily.  7  . traMADol (ULTRAM) 50 MG tablet Take 1 tablet (50 mg total) by mouth every 6 (six) hours as needed for moderate pain. 21 tablet 0   No current facility-administered medications on file prior to visit.      Past Medical History:  Diagnosis Date  . Anxiety   . Back pain    related to spinal stenosis and disc problem, radiates down left buttocks to leg., weakness occ.  [name] Headache   . Hyperlipidemia   . Hypertension   . PONV (postoperative nausea and vomiting)   . Vaginal foreign object    "Uses Femring"   Allergies  Allergen Reactions  . Cephalosporins Anaphylaxis  . Penicillins Anaphylaxis and Hives    Has patient had a PCN reaction causing immediate rash, facial/tongue/throat swelling, SOB or lightheadedness with hypotension: Yes Has patient had a PCN reaction causing severe rash involving mucus membranes or skin necrosis: Yes Has patient had a PCN reaction that required hospitalization Yes Has patient had a PCN reaction occurring within the last 10 years: No If all of the above answers are "NO", then may proceed with Cephalosporin use.   . Anesthetics, Amide Nausea And Vomiting    Does not know name of it. States they put it on record foot center.   [name] Peach [Prunus Persica] Hives  . Latex Hives, Itching and Rash    Family History  Problem Relation Age of Onset  . Heart attack Mother   . Lung cancer Father   . Cancer Father   . Pancreatic cancer Sister   . Breast cancer Sister 56  . Throat cancer Brother   . Multiple myeloma Sister   . Breast cancer Sister        diagnosed in her 78's  . Heart attack Sister   . Stomach cancer Cousin   . Colon cancer Neg Hx     Social History   Socioeconomic History  . Marital status: Married    Spouse name: Not on file  . Number of children: Not on file  . Years of education: Not on file  . Highest education level: Not on file  Occupational History  . Not on file  Social Needs  . Financial resource  strain: Not on file  . Food insecurity:    Worry: Not on file    Inability: Not on file  . Transportation needs:    Medical: Not on file    Non-medical: Not on file  Tobacco Use  . Smoking status: Never Smoker  . Smokeless tobacco: Never Used  Substance and Sexual Activity  . Alcohol use: No  . Drug use: No  . Sexual activity: Not on file  Lifestyle  . Physical activity:    Days per week: Not on file    Minutes per session: Not on file  . Stress: Not on file  Relationships  . Social connections:    Talks on phone: Not on file    Gets together: Not on file  Attends religious service: Not on file    Active member of club or organization: Not on file    Attends meetings of clubs or organizations: Not on file    Relationship status: Not on file  Other Topics Concern  . Not on file  Social History Narrative  . Not on file    Vitals:   04/16/18 1111  BP: 124/83  Pulse: 82  Resp: 12  Temp: 98.2 F (36.8 C)  SpO2: 97%    Body mass index is 25.31 kg/m.    Physical Exam  Nursing note and vitals reviewed. Constitutional: She is oriented to person, place, and time. She appears well-developed and well-nourished. No distress.  HENT:  Head: Normocephalic and atraumatic.  Mouth/Throat: Oropharynx is clear and moist and mucous membranes are normal.  Eyes: Pupils are equal, round, and reactive to light. Conjunctivae are normal.  Cardiovascular: Normal rate and regular rhythm.  No murmur heard. Pulses:      Dorsalis pedis pulses are 2+ on the right side, and 2+ on the left side.  Respiratory: Effort normal and breath sounds normal. No respiratory distress.  GI: Soft. She exhibits no mass. There is no hepatomegaly. There is no tenderness.  Musculoskeletal: She exhibits no edema.  Lymphadenopathy:    She has no cervical adenopathy.  Neurological: She is alert and oriented to person, place, and time. She has normal strength.  Gait assisted by a cane.  Skin: Skin is warm.  No erythema.  Psychiatric: She has a normal mood and affect.  Well groomed, good eye contact.    ASSESSMENT AND PLAN:   [name] was seen today for establish care.  Diagnoses and all orders for this visit:  Essential hypertension  Adequately controlled. For now no changes in current management. Low salt diet to continue. Continue monitoring BP. Eye exam recommended annually. F/U in 4 months, before if needed.  Hyperlipidemia, unspecified hyperlipidemia type  She will continue non pharmacologic treatment. She is not fasting today,so will be back next week for fasting labs  -     Lipid panel; Future  Generalized anxiety disorder with panic attacks  Stable. No changes in current management. Some side effects of Alprazolam discussed. F/U in 4 months.  Spinal stenosis at L4-L5 level  Recovering from spinal hematoma, symptom progressively getting better. Continue PT.      [name]. [name], MD  [facility]. [place] office.

## 2018-04-18 ENCOUNTER — Encounter: Payer: Self-pay | Admitting: Family Medicine

## 2018-04-19 DIAGNOSIS — M4800 Spinal stenosis, site unspecified: Secondary | ICD-10-CM | POA: Diagnosis not present

## 2018-04-19 DIAGNOSIS — R269 Unspecified abnormalities of gait and mobility: Secondary | ICD-10-CM | POA: Diagnosis not present

## 2018-04-19 LAB — LIPID PANEL
Cholesterol: 287 mg/dL — ABNORMAL HIGH (ref 0–200)
HDL: 76.4 mg/dL (ref >39.00)
LDL Cholesterol: 196 mg/dL — ABNORMAL HIGH (ref 0–99)
NONHDL: 210.59
Total CHOL/HDL Ratio: 4
Triglycerides: 73 mg/dL (ref 0.0–149.0)
VLDL: 14.6 mg/dL (ref 0.0–40.0)

## 2018-04-19 NOTE — Addendum Note (Signed)
Addended by: Rene Kocher on: 04/19/2018 11:16 AM   Modules accepted: Orders

## 2018-04-21 DIAGNOSIS — Z981 Arthrodesis status: Secondary | ICD-10-CM | POA: Diagnosis not present

## 2018-04-21 DIAGNOSIS — M48061 Spinal stenosis, lumbar region without neurogenic claudication: Secondary | ICD-10-CM | POA: Diagnosis not present

## 2018-04-21 DIAGNOSIS — Z4789 Encounter for other orthopedic aftercare: Secondary | ICD-10-CM | POA: Diagnosis not present

## 2018-04-21 DIAGNOSIS — R296 Repeated falls: Secondary | ICD-10-CM | POA: Diagnosis not present

## 2018-04-21 DIAGNOSIS — R531 Weakness: Secondary | ICD-10-CM | POA: Diagnosis not present

## 2018-04-21 DIAGNOSIS — I10 Essential (primary) hypertension: Secondary | ICD-10-CM | POA: Diagnosis not present

## 2018-04-21 DIAGNOSIS — F419 Anxiety disorder, unspecified: Secondary | ICD-10-CM | POA: Diagnosis not present

## 2018-04-23 DIAGNOSIS — I10 Essential (primary) hypertension: Secondary | ICD-10-CM | POA: Diagnosis not present

## 2018-04-23 DIAGNOSIS — M48061 Spinal stenosis, lumbar region without neurogenic claudication: Secondary | ICD-10-CM | POA: Diagnosis not present

## 2018-04-23 DIAGNOSIS — F419 Anxiety disorder, unspecified: Secondary | ICD-10-CM | POA: Diagnosis not present

## 2018-04-23 DIAGNOSIS — Z981 Arthrodesis status: Secondary | ICD-10-CM | POA: Diagnosis not present

## 2018-04-23 DIAGNOSIS — R296 Repeated falls: Secondary | ICD-10-CM | POA: Diagnosis not present

## 2018-04-23 DIAGNOSIS — R531 Weakness: Secondary | ICD-10-CM | POA: Diagnosis not present

## 2018-04-23 DIAGNOSIS — Z4789 Encounter for other orthopedic aftercare: Secondary | ICD-10-CM | POA: Diagnosis not present

## 2018-04-27 ENCOUNTER — Other Ambulatory Visit: Payer: Self-pay | Admitting: *Deleted

## 2018-04-27 ENCOUNTER — Telehealth: Payer: Self-pay | Admitting: *Deleted

## 2018-04-27 ENCOUNTER — Telehealth: Payer: Self-pay | Admitting: Family Medicine

## 2018-04-27 ENCOUNTER — Other Ambulatory Visit: Payer: Self-pay | Admitting: Family Medicine

## 2018-04-27 DIAGNOSIS — M48061 Spinal stenosis, lumbar region without neurogenic claudication: Secondary | ICD-10-CM | POA: Diagnosis not present

## 2018-04-27 DIAGNOSIS — R531 Weakness: Secondary | ICD-10-CM | POA: Diagnosis not present

## 2018-04-27 DIAGNOSIS — R296 Repeated falls: Secondary | ICD-10-CM | POA: Diagnosis not present

## 2018-04-27 DIAGNOSIS — F419 Anxiety disorder, unspecified: Secondary | ICD-10-CM | POA: Diagnosis not present

## 2018-04-27 DIAGNOSIS — I10 Essential (primary) hypertension: Secondary | ICD-10-CM | POA: Diagnosis not present

## 2018-04-27 DIAGNOSIS — Z981 Arthrodesis status: Secondary | ICD-10-CM | POA: Diagnosis not present

## 2018-04-27 DIAGNOSIS — Z4789 Encounter for other orthopedic aftercare: Secondary | ICD-10-CM | POA: Diagnosis not present

## 2018-04-27 MED ORDER — ATORVASTATIN CALCIUM 20 MG PO TABS: 20.0000 mg | ORAL_TABLET | Freq: Every day | ORAL | 3 refills | Status: DC

## 2018-04-27 MED ORDER — LORAZEPAM 0.5 MG PO TABS: 0.2500 mg | ORAL_TABLET | Freq: Every day | ORAL | 1 refills | Status: DC | PRN

## 2018-04-27 MED FILL — ATORVASTATIN 20 MG TABLET: 20 | 90 days supply | Qty: 90 | Fill #0

## 2018-04-27 NOTE — Telephone Encounter (Signed)
Pt states that Dr [name] asked her if she needed a refill on medicine at her physical appt and she told her that she did pt would like a call back [phone]

## 2018-04-27 NOTE — Telephone Encounter (Signed)
Message sent to Dr. Jordan for review and approval. 

## 2018-04-27 NOTE — Telephone Encounter (Signed)
Refill of Xanax by historical provider  LOV 04/16/18 Dr. [name]   LRF 03/19/18  5 tabs  0 refills  [facility] [place] - [address].

## 2018-04-27 NOTE — Telephone Encounter (Signed)
Patient was given lab results from nurse at [facility] on 04/26/18. Rx on Atorvastatin sent to pharmacy. Request for anxiety medication sent to Dr. [name] for approval.

## 2018-04-27 NOTE — Telephone Encounter (Signed)
Copied from [place] [id number]. Topic: Quick Communication - Rx Refill/Question >> Apr 27, 2018 11:26 AM Yvette Rack wrote: Medication: ALPRAZolam Duanne Moron) 0.25 MG tablet  Has the patient contacted their pharmacy? Yes.  She's at pharmacy to pick this medicine up (Agent: If no, request that the patient contact the pharmacy for the refill.) (Agent: If yes, when and what did the pharmacy advise?)  Preferred Pharmacy (with phone number or street name):   [place], [name] - [address]. [phone] (Phone) [phone] (Fax)      Agent: Please be advised that RX refills may take up to 3 business days. We ask that you follow-up with your pharmacy.

## 2018-04-27 NOTE — Telephone Encounter (Signed)
Prescription for Xanax 0.5 mg to continue 0.5 to 1 tablet daily as needed was sent to her pharmacy. Thanks, BJ

## 2018-04-27 NOTE — Telephone Encounter (Signed)
Copied from [place] [id number]. Topic: General - Other >> Apr 26, 2018  5:08 PM Yvette Rack wrote: Reason for CRM: Pt states after speaking with the nurse this morning 04/26/18 she was told a Rx for a cholesterol medication would be called in to her pharmacy but the pharmacy has not received a Rx. Pt requests a call back. Cb# [phone]

## 2018-04-28 MED FILL — ALPRAZolam 0.25 MG TABS: 0.25 | 30 days supply | Qty: 30 | Fill #0

## 2018-04-28 NOTE — Telephone Encounter (Signed)
Spoke with patient and informed her that Rx for Ativan was sent to pharmacy instead of Xanax. Phoned in Rx for Xanax to pharmacy. D/C Rx for Ativan. Patient informed.

## 2018-04-28 NOTE — Telephone Encounter (Signed)
Pt called back in, pt says that she spoke with her pharmacy and was advised that they never received Rx. Pt would like further assistance with this as pt is trying to pick up her medication today.

## 2018-04-28 NOTE — Telephone Encounter (Signed)
Patient informed that medication was sent to pharmacy

## 2018-04-30 DIAGNOSIS — M48061 Spinal stenosis, lumbar region without neurogenic claudication: Secondary | ICD-10-CM | POA: Diagnosis not present

## 2018-04-30 DIAGNOSIS — F419 Anxiety disorder, unspecified: Secondary | ICD-10-CM | POA: Diagnosis not present

## 2018-04-30 DIAGNOSIS — I10 Essential (primary) hypertension: Secondary | ICD-10-CM | POA: Diagnosis not present

## 2018-04-30 DIAGNOSIS — R296 Repeated falls: Secondary | ICD-10-CM | POA: Diagnosis not present

## 2018-04-30 DIAGNOSIS — R531 Weakness: Secondary | ICD-10-CM | POA: Diagnosis not present

## 2018-04-30 DIAGNOSIS — Z981 Arthrodesis status: Secondary | ICD-10-CM | POA: Diagnosis not present

## 2018-04-30 DIAGNOSIS — Z4789 Encounter for other orthopedic aftercare: Secondary | ICD-10-CM | POA: Diagnosis not present

## 2018-05-03 ENCOUNTER — Other Ambulatory Visit: Payer: Self-pay

## 2018-05-03 ENCOUNTER — Encounter: Payer: 59 | Attending: Physical Medicine & Rehabilitation | Admitting: Physical Medicine & Rehabilitation

## 2018-05-03 ENCOUNTER — Encounter: Payer: Self-pay | Admitting: Physical Medicine & Rehabilitation

## 2018-05-03 VITALS — BP 150/102 | HR 84 | Ht 65.0 in | Wt 152.0 lb

## 2018-05-03 DIAGNOSIS — F419 Anxiety disorder, unspecified: Secondary | ICD-10-CM | POA: Diagnosis not present

## 2018-05-03 DIAGNOSIS — I10 Essential (primary) hypertension: Secondary | ICD-10-CM | POA: Insufficient documentation

## 2018-05-03 DIAGNOSIS — Z809 Family history of malignant neoplasm, unspecified: Secondary | ICD-10-CM | POA: Diagnosis not present

## 2018-05-03 DIAGNOSIS — M5416 Radiculopathy, lumbar region: Secondary | ICD-10-CM | POA: Diagnosis not present

## 2018-05-03 DIAGNOSIS — Z807 Family history of other malignant neoplasms of lymphoid, hematopoietic and related tissues: Secondary | ICD-10-CM | POA: Insufficient documentation

## 2018-05-03 DIAGNOSIS — M62838 Other muscle spasm: Secondary | ICD-10-CM | POA: Diagnosis not present

## 2018-05-03 DIAGNOSIS — Z800 Family history of malignant neoplasm of digestive organs: Secondary | ICD-10-CM | POA: Diagnosis not present

## 2018-05-03 DIAGNOSIS — Z09 Encounter for follow-up examination after completed treatment for conditions other than malignant neoplasm: Secondary | ICD-10-CM | POA: Insufficient documentation

## 2018-05-03 DIAGNOSIS — Z808 Family history of malignant neoplasm of other organs or systems: Secondary | ICD-10-CM | POA: Diagnosis not present

## 2018-05-03 DIAGNOSIS — Z803 Family history of malignant neoplasm of breast: Secondary | ICD-10-CM | POA: Insufficient documentation

## 2018-05-03 DIAGNOSIS — Z90710 Acquired absence of both cervix and uterus: Secondary | ICD-10-CM | POA: Diagnosis not present

## 2018-05-03 DIAGNOSIS — Z9889 Other specified postprocedural states: Secondary | ICD-10-CM | POA: Diagnosis not present

## 2018-05-03 DIAGNOSIS — G8220 Paraplegia, unspecified: Secondary | ICD-10-CM

## 2018-05-03 DIAGNOSIS — Z801 Family history of malignant neoplasm of trachea, bronchus and lung: Secondary | ICD-10-CM | POA: Insufficient documentation

## 2018-05-03 MED ORDER — METHOCARBAMOL 500 MG PO TABS: 500.0000 mg | ORAL_TABLET | Freq: Four times a day (QID) | ORAL | 2 refills | Status: DC | PRN

## 2018-05-03 MED ORDER — GABAPENTIN 300 MG PO CAPS: 300.0000 mg | ORAL_CAPSULE | Freq: Three times a day (TID) | ORAL | 4 refills | Status: DC

## 2018-05-03 NOTE — Patient Instructions (Signed)
PLEASE FEEL FREE TO CALL OUR OFFICE WITH ANY PROBLEMS OR QUESTIONS ([phone])      

## 2018-05-03 NOTE — Progress Notes (Signed)
Subjective:    Patient ID: [name], female    DOB: [birth year], 57 y.o.   MRN: [id number]  HPI   [name] is here in follow up of her lumbar radiculopathy and associated gait deficits and RLE weakness.  She is still working with [facility] therapies and will be moving to outpt PT next week. She is using a right AFO which has generally been working for her. She had one fall trying to get out of the shower on her own when her foot slipped out from under her. She admits to making a bad "choice".   Her pain has been well controlled. Her last "bad pain " was July 4th when she overdid it a bit. Her pain is most significant in her right low back and is associated with long dx ambulation. She will use oxycodone and tramadol for severe pain (rare). More often she uses robaxin for spasms. She continues on gabapentin for neuropathic pain.   Bowels and bladder are working without issue. Mood has been improved. She is very positive.     Pain Inventorysd Average Pain 3 Pain Right Now 2 My pain is na  In the last 24 hours, has pain interfered with the following? General activity 5 Relation with others 5 Enjoyment of life 5 What TIME of day is your pain at its worst? varies Sleep (in general) Poor  Pain is worse with: some activites Pain improves with: rest, therapy/exercise and medication Relief from Meds: 5  Mobility walk without assistance use a cane  Function disabled: date disabled 03/04/2018  Neuro/Psych weakness trouble walking spasms  Prior Studies Any changes since last visit?  no  Physicians involved in your care Any changes since last visit?  no Orthopedist Dr Tonita Cong   Family History  Problem Relation Age of Onset  . Heart attack Mother   . Lung cancer Father   . Cancer Father   . Pancreatic cancer Sister   . Breast cancer Sister 16  . Throat cancer Brother   . Multiple myeloma Sister   . Breast cancer Sister        diagnosed in her 32's  . Heart attack Sister    . Stomach cancer Cousin   . Colon cancer Neg Hx    Social History   Socioeconomic History  . Marital status: Married    Spouse name: Not on file  . Number of children: Not on file  . Years of education: Not on file  . Highest education level: Not on file  Occupational History  . Not on file  Social Needs  . Financial resource strain: Not on file  . Food insecurity:    Worry: Not on file    Inability: Not on file  . Transportation needs:    Medical: Not on file    Non-medical: Not on file  Tobacco Use  . Smoking status: Never Smoker  . Smokeless tobacco: Never Used  Substance and Sexual Activity  . Alcohol use: No  . Drug use: No  . Sexual activity: Not on file  Lifestyle  . Physical activity:    Days per week: Not on file    Minutes per session: Not on file  . Stress: Not on file  Relationships  . Social connections:    Talks on phone: Not on file    Gets together: Not on file    Attends religious service: Not on file    Active member of club or organization: Not on file  Attends meetings of clubs or organizations: Not on file    Relationship status: Not on file  Other Topics Concern  . Not on file  Social History Narrative  . Not on file   Past Surgical History:  Procedure Laterality Date  . ABDOMINAL HYSTERECTOMY    . CARDIAC CATHETERIZATION N/A 04/18/2015   Procedure: Left Heart Cath and Coronary Angiography;  Surgeon: [name], MD;  Location: [place] [facility];  Service: Cardiovascular;  Laterality: N/A;  . FOOT SURGERY Bilateral    [place] "bunion,bone spur, tendon" (1) -6'16, (1)-10'16  . LUMBAR LAMINECTOMY/DECOMPRESSION MICRODISCECTOMY Bilateral 12/28/2015   Procedure: MICRO LUMBAR DECOMPRESSION L4 - L5 BILATERALLY;  Surgeon: [name], MD;  [facility];  Service: Orthopedics;  Laterality: Bilateral;  . LUMBAR LAMINECTOMY/DECOMPRESSION MICRODISCECTOMY Bilateral 03/04/2018   Procedure: Revision of Microlumbar Decompression  Bilateral Lumbar Four-Five;  Surgeon: [name], MD;  Location: [place];  Service: Orthopedics;  Laterality: Bilateral;  90 mins  . WISDOM TOOTH EXTRACTION    . WOUND EXPLORATION N/A 03/04/2018   Procedure: EXPLORATION OF LUMBAR DECOMPRESSION WOUND;  Surgeon: [name], MD;  Location: [place];  Service: Orthopedics;  Laterality: N/A;   Past Medical History:  Diagnosis Date  . Anxiety   . Back pain    related to spinal stenosis and disc problem, radiates down left buttocks to leg., weakness occ.  [name] Headache   . Hyperlipidemia   . Hypertension   . PONV (postoperative nausea and vomiting)   . Vaginal foreign object    "Uses Femring"   BP (!) 150/102   Pulse 84   Ht '5\' 5"'$  (1.651 m)   Wt 152 lb (68.9 kg)   SpO2 97%   BMI 25.29 kg/m   Opioid Risk Score:   Fall Risk Score:  `1  Depression screen PHQ 2/9  Depression screen [facility] 2/9 05/03/2018 04/01/2018  Decreased Interest 0 1  Down, Depressed, Hopeless 0 1  PHQ - 2 Score 0 2  Altered sleeping - 1  Tired, decreased energy - 1  Change in appetite - 1  Feeling bad or failure about yourself  - 0  Trouble concentrating - 0  Moving slowly or fidgety/restless - 0  Suicidal thoughts - 0  PHQ-9 Score - 5  Difficult doing work/chores - Somewhat difficult    Review of Systems  Constitutional: Positive for diaphoresis.  HENT: Negative.   Eyes: Negative.   Respiratory: Negative.   Cardiovascular: Negative.   Gastrointestinal: Negative.   Endocrine: Negative.   Genitourinary: Negative.   Musculoskeletal: Positive for gait problem.       Spasms  Allergic/Immunologic: Negative.   Neurological: Positive for weakness.  Hematological: Negative.   Psychiatric/Behavioral: Negative.   All other systems reviewed and are negative.      Objective:   Physical Exam  General: Alert and oriented x 3, No apparent distress HEENT: Head is normocephalic, atraumatic, PERRLA, EOMI, sclera anicteric, oral mucosa pink and moist, dentition  intact, ext ear canals clear,  Neck: Supple without JVD or lymphadenopathy Heart: Reg rate and rhythm. No murmurs rubs or gallops Chest: CTA bilaterally without wheezes, rales, or rhonchi; no distress Abdomen: Soft, non-tender, non-distended, bowel sounds positive. Extremities: No clubbing, cyanosis, or edema. Pulses are 2+ Skin: Clean and intact without signs of breakdown Neuro: Pt is cognitively appropriate with normal insight, memory, and awareness. Cranial nerves 2-12 are intact. Sensory decreased right foot.    Fine motor coordination is intact. No tremors. Motor function is grossly  5/5 UE, RLE 4/5 HF, KE and ADF/PF 2/5. Walks with straight legged gait on right, utilizing her back to hellp clear leg. Also circumducts right leg slightly. AFO fits appropriately. Drags foot sllightly   Musculoskeletal: Full ROM, No pain with AROM or PROM in the neck, trunk, or extremities. Posture appropriate, spasms  Right lower lumbar paraspinals.  Psych: Pt's affect is appropriate. Pt is cooperative        Assessment & Plan:  1.Functional and mobility deficitssecondary to lumbar stenosis with multiple-level radiculopathies s/p Decompression and re-do. Some of weakness remains non-organic. 2.  . Pain Management:Oxycodonefor severe pain, tramadol for moderate pain--per ortho  -robaxin for spasms.     -gabapentin tid for neuropathic pain. 4. Mood: much improved, very positive  5. HTN: re-check bp here again prior to leaving.   -has been otherwise under control at home and during previous encounters   Fifteen minutes of face to face patient care time were spent during this visit. All questions were encouraged and answered.  Follow up with me in 2 months.

## 2018-05-10 ENCOUNTER — Other Ambulatory Visit: Payer: Self-pay

## 2018-05-10 ENCOUNTER — Ambulatory Visit: Payer: 59 | Attending: Specialist

## 2018-05-10 DIAGNOSIS — G8929 Other chronic pain: Secondary | ICD-10-CM

## 2018-05-10 DIAGNOSIS — M6281 Muscle weakness (generalized): Secondary | ICD-10-CM | POA: Diagnosis not present

## 2018-05-10 DIAGNOSIS — R262 Difficulty in walking, not elsewhere classified: Secondary | ICD-10-CM

## 2018-05-10 DIAGNOSIS — M25671 Stiffness of right ankle, not elsewhere classified: Secondary | ICD-10-CM | POA: Diagnosis not present

## 2018-05-10 DIAGNOSIS — M545 Low back pain: Secondary | ICD-10-CM | POA: Diagnosis not present

## 2018-05-10 DIAGNOSIS — M48061 Spinal stenosis, lumbar region without neurogenic claudication: Secondary | ICD-10-CM | POA: Diagnosis not present

## 2018-05-10 NOTE — Therapy (Signed)
[place], [name], [address] Phone: [phone]   Fax:  [phone]  Physical Therapy Evaluation  Patient Details  Name: [name] MRN: [id number] Date of Birth: Jul 27, 1961 Referring Provider: [name], MD   Encounter Date: 05/10/2018  PT End of Session - 05/10/18 1048    Visit Number  1    Number of Visits  24    Date for PT Re-Evaluation  08/13/18    Authorization Type  [facility]    PT Start Time  1027    PT Stop Time  1145    PT Time Calculation (min)  58 min    Activity Tolerance  Patient tolerated treatment well    Behavior During Therapy  [facility] for tasks assessed/performed       Past Medical History:  Diagnosis Date  . Anxiety   . Back pain    related to spinal stenosis and disc problem, radiates down left buttocks to leg., weakness occ.  [name] Headache   . Hyperlipidemia   . Hypertension   . PONV (postoperative nausea and vomiting)   . Vaginal foreign object    "Uses Femring"    Past Surgical History:  Procedure Laterality Date  . ABDOMINAL HYSTERECTOMY    . CARDIAC CATHETERIZATION N/A 04/18/2015   Procedure: Left Heart Cath and Coronary Angiography;  Surgeon: [name], MD;  Location: [facility];  Service: Cardiovascular;  Laterality: N/A;  . FOOT SURGERY Bilateral    [place] "bunion,bone spur, tendon" (1) -6'16, (1)-10'16  . LUMBAR LAMINECTOMY/DECOMPRESSION MICRODISCECTOMY Bilateral 12/28/2015   Procedure: MICRO LUMBAR DECOMPRESSION L4 - L5 BILATERALLY;  Surgeon: [name], MD;  [facility];  Service: Orthopedics;  Laterality: Bilateral;  . LUMBAR LAMINECTOMY/DECOMPRESSION MICRODISCECTOMY Bilateral 03/04/2018   Procedure: Revision of Microlumbar Decompression Bilateral Lumbar Four-Five;  Surgeon: [name], MD;  Location: [place];  Service: Orthopedics;  Laterality: Bilateral;  90 mins  . WISDOM TOOTH EXTRACTION    . WOUND EXPLORATION N/A  03/04/2018   Procedure: EXPLORATION OF LUMBAR DECOMPRESSION WOUND;  Surgeon: [name], MD;  Location: [place];  Service: Orthopedics;  Laterality: N/A;    There were no vitals filed for this visit.   Subjective Assessment - 05/10/18 1055    Subjective   She had second surgery due to increased back pain.       She also had  pain and decr sensation both LE.     Post  surgery  LT leg is good and RT is better.   .    No feeling in feeling in RT foot,   Movement is better in ankle.  Feels Lt. leg locks at times.   She walks with walker occasionally in home post bathing.  Most with SPC.   She last worked was 02/21/18         Patient is accompained by:  Family member in lobby    Pertinent History  previous ack decompression    Limitations  Walking;House hold activities    How long can you sit comfortably?  As needed    How long can you walk comfortably?  Home distances    Patient Stated Goals  She wants to walk normal and drive and return to normal home tasks.     Currently in Pain?  Yes    Pain Score  3     Pain Location  Back    Pain Orientation  Right    Pain Descriptors /  Indicators  Sore;Spasm    Pain Type  Surgical pain    Pain Onset  More than a month ago    Pain Frequency  Constant    Aggravating Factors   bending, lifting kitchen items,  standing     Pain Relieving Factors  medication,  heat to back    Multiple Pain Sites  No         [facility] PT Assessment - 05/10/18 0001      Assessment   Medical Diagnosis  revision lumbar decompression    Referring Provider  [name], MD    Onset Date/Surgical Date  03/04/18    Next MD Visit  06/01/18    Prior Therapy  [facility]PT ended 04/30/18      Precautions   Precaution Comments  n lifting /bending Sarajane Jews for short periods of time      Restrictions   Weight Bearing Restrictions  No      Balance Screen   Has the patient fallen in the past 6 months  Yes    How many times?  1-2x/week prior to surgery,   a few times almost with leg  getting tired / giving out. ,      Has the patient had a decrease in activity level because of a fear of falling?   Yes    Is the patient reluctant to leave their home because of a fear of falling?   No      Home Environment   Living Environment  Private residence    Living Arrangements  Spouse/significant other;Children    Type of [place] to enter    Entrance Stairs-Number of Steps  4    Entrance Stairs-Rails  Right    Home Layout  Two level    Alternate Level Stairs-Number of Steps  8    Alternate Level Stairs-Rails  Right      Observation/Other Assessments   Focus on Therapeutic Outcomes (FOTO)   64% limited      ROM / Strength   AROM / PROM / Strength  AROM;Strength;PROM      PROM   Overall PROM Comments  RT hip and knee WFl . RT ankle DF 80 degrees  PF and IV/ EV also limited.       Strength   Overall Strength Comments  Left leg  was amix of 4+/5 and less with mix of what appeared to be decr effort     Strength Assessment Site  Ankle;Knee;Hip    Right/Left Hip  Right    Right Hip Flexion  3/5    Right Hip Extension  2+/5    Right Hip External Rotation   3/5    Right Hip Internal Rotation  4+/5    Right Hip ABduction  2+/5    Right Hip ADduction  2+/5    Right/Left Knee  Right    Right Knee Flexion  4/5    Right Knee Extension  4+/5    Right/Left Ankle  Right    Right Ankle Dorsiflexion  1/5    Right Ankle Plantar Flexion  1/5      Flexibility   Soft Tissue Assessment /Muscle Length  yes    Hamstrings  45 degrees bilaterally      Ambulation/Gait   Gait Comments  Decr f;lexion on RT leg with RW. decr weight bearing on RT.   With [facility] the same but less so on RT and when attempted to  flex RT LE and step more she lost her balance and needed assist to catch  same.         Balance   Balance Assessed  -- Unable to stand RT or LT leg without UE support                Objective measurements completed on examination: See above  findings.      [place] Adult PT Treatment/Exercise - 05/10/18 0001      Ambulation/Gait   Pre-Gait Activities  Worked on RT LEF flexion and heel strike with swing phhase and worked with [employer] and she does npot appear  completely safe with [facility]      Exercises   Exercises  Knee/Hip             PT Education - 05/10/18 1145    Education Details  POC , suggested she walk more with wlaker and practice losnger steps and RT LEF flexion with swing.     Person(s) Educated  Patient    Methods  Explanation;Demonstration;Verbal cues    Comprehension  Returned demonstration;Verbalized understanding       PT Short Term Goals - 05/10/18 1157      PT SHORT TERM GOAL #1   Title  She will be independent with initial HEP     Time  4    Period  Weeks    Status  New      PT SHORT TERM GOAL #2   Title  Pt will be able to demo posture and body mechanics as it relates to lumbar spine  with bending and lifting     Time  4    Period  Weeks      PT SHORT TERM GOAL #3   Title  She will improve Berg score to demo safety with walking with SPC    Time  4    Period  Weeks    Status  New      PT SHORT TERM GOAL #4   Title  She will demo correctl flexion of RT leg with walking with SPC without LOB    Time  4    Period  Weeks    Status  New        PT Long Term Goals - 05/10/18 1200      PT LONG TERM GOAL #1   Title  She will be independent with all HEP issued    Time  12    Period  Weeks    Status  New      PT LONG TERM GOAL #2   Title  Pt will tolerate sitting for 1 hour without pain by 04/03/16    Time  12    Period  Weeks    Status  New      PT LONG TERM GOAL #3   Title  Bilat LE strength (except RT ankle)  will improve to 4/5 or better in order to stand and walk for 30 mins with SPC by 04/03/16     Time  12    Period  Weeks    Status  New      PT LONG TERM GOAL #4   Title  She will demo stairs with step over step pattern with one rail safely    Time  12    Period  Weeks     Status  New      PT LONG TERM GOAL #5   Title  FOTO score to improve  to   50% limited to demo improved function    Time  12    Period  Weeks    Status  New      Additional Long Term Goals   Additional Long Term Goals  Yes      PT LONG TERM GOAL #6   Title  She will walk in home without device and out of home with [facility] to improve community access    Time  12    Period  Weeks    Status  New      PT LONG TERM GOAL #7   Title  She will tolerate 90 minon feet for home and community activity    Time  12    Period  Weeks    Status  New             Plan - 05/10/18 1049    Clinical Impression Statement  Ms Michie is post second back surgery with weakness and difficulty walking . Also stiffness of RT ankle may make walking moore difficult. Her strength assessment was uneven with some holding and release and some groups  more consistent . She should iimprove with PT but expect some on going limitations.     History and Personal Factors relevant to plan of care:  previous back decompression,  falls, time with back pain and  leg involvement prior to return to MD for surgery    Clinical Presentation  Evolving    Clinical Presentation due to:  pain and weakness in both LE with decr balance /strength and walking    Clinical Decision Making  Moderate    Rehab Potential  Good    PT Frequency  2x / week    PT Duration  12 weeks    PT Treatment/Interventions  Manual techniques;Patient/family education;Therapeutic exercise;Therapeutic activities;Cryotherapy;Gait training;Stair training    PT Next Visit Plan  Strengthening , gait and balance.  [facility] HEP    Consulted and Agree with Plan of Care  Patient       Patient will benefit from skilled therapeutic intervention in order to improve the following deficits and impairments:  Pain, Postural dysfunction, Difficulty walking, Decreased strength, Decreased activity tolerance  Visit Diagnosis: Spinal stenosis at L4-L5 level  Chronic  right-sided low back pain, with sciatica presence unspecified  Difficulty in walking, not elsewhere classified  Muscle weakness (generalized)  Stiffness of right ankle joint     Problem List Patient Active Problem List   Diagnosis Date Noted  . Hyperlipidemia 04/16/2018  . Generalized anxiety disorder with panic attacks 04/16/2018  . AKI (acute kidney injury) ([place])   . Benign essential HTN   . Depression with anxiety   . Lumbar radiculopathy 03/09/2018  . Paraparesis ([place]) 03/09/2018  . Essential hypertension   . Chronic nonintractable headache   . Leukocytosis   . Acute blood loss anemia   . Postoperative pain   . Generalized weakness   . Spinal stenosis at L4-L5 level 03/04/2018  . Spinal stenosis of lumbar region 12/28/2015  . Chest pain 04/16/2015    Darrel Hoover  PT 05/10/2018, 12:15 PM  [facility] [address] [place], [name], [address] Phone: [phone]   Fax:  [phone]  Name: TRINADY MILEWSKI MRN: [id number] Date of Birth: 21-Dec-1960

## 2018-05-13 ENCOUNTER — Ambulatory Visit: Payer: 59

## 2018-05-13 DIAGNOSIS — M6281 Muscle weakness (generalized): Secondary | ICD-10-CM | POA: Diagnosis not present

## 2018-05-13 DIAGNOSIS — M545 Low back pain: Secondary | ICD-10-CM | POA: Diagnosis not present

## 2018-05-13 DIAGNOSIS — R262 Difficulty in walking, not elsewhere classified: Secondary | ICD-10-CM

## 2018-05-13 DIAGNOSIS — G8929 Other chronic pain: Secondary | ICD-10-CM | POA: Diagnosis not present

## 2018-05-13 DIAGNOSIS — Z760 Encounter for issue of repeat prescription: Secondary | ICD-10-CM | POA: Diagnosis not present

## 2018-05-13 DIAGNOSIS — M48061 Spinal stenosis, lumbar region without neurogenic claudication: Secondary | ICD-10-CM | POA: Diagnosis not present

## 2018-05-13 DIAGNOSIS — M25671 Stiffness of right ankle, not elsewhere classified: Secondary | ICD-10-CM | POA: Diagnosis not present

## 2018-05-13 NOTE — Therapy (Signed)
[place], [name], [address] Phone: [phone]   Fax:  [phone]  Physical Therapy Treatment  Patient Details  Name: [name] MRN: [id number] Date of Birth: 03/01/1961 Referring Provider: [name], MD   Encounter Date: 05/13/2018  PT End of Session - 05/13/18 0707    Visit Number  2    Number of Visits  24    Date for PT Re-Evaluation  08/13/18    Authorization Type  [facility]    PT Start Time  0705    PT Stop Time  0745    PT Time Calculation (min)  40 min    Activity Tolerance  Patient tolerated treatment well    Behavior During Therapy  [facility] for tasks assessed/performed       Past Medical History:  Diagnosis Date  . Anxiety   . Back pain    related to spinal stenosis and disc problem, radiates down left buttocks to leg., weakness occ.  [name] Headache   . Hyperlipidemia   . Hypertension   . PONV (postoperative nausea and vomiting)   . Vaginal foreign object    "Uses Femring"    Past Surgical History:  Procedure Laterality Date  . ABDOMINAL HYSTERECTOMY    . CARDIAC CATHETERIZATION N/A 04/18/2015   Procedure: Left Heart Cath and Coronary Angiography;  Surgeon: [name], MD;  Location: [place] [facility];  Service: Cardiovascular;  Laterality: N/A;  . FOOT SURGERY Bilateral    [place] "bunion,bone spur, tendon" (1) -6'16, (1)-10'16  . LUMBAR LAMINECTOMY/DECOMPRESSION MICRODISCECTOMY Bilateral 12/28/2015   Procedure: MICRO LUMBAR DECOMPRESSION L4 - L5 BILATERALLY;  Surgeon: [name], MD;  [facility];  Service: Orthopedics;  Laterality: Bilateral;  . LUMBAR LAMINECTOMY/DECOMPRESSION MICRODISCECTOMY Bilateral 03/04/2018   Procedure: Revision of Microlumbar Decompression Bilateral Lumbar Four-Five;  Surgeon: [name], MD;  Location: [place];  Service: Orthopedics;  Laterality: Bilateral;  90 mins  . WISDOM TOOTH EXTRACTION    . WOUND EXPLORATION N/A  03/04/2018   Procedure: EXPLORATION OF LUMBAR DECOMPRESSION WOUND;  Surgeon: [name], MD;  Location: [place];  Service: Orthopedics;  Laterality: N/A;    There were no vitals filed for this visit.  Subjective Assessment - 05/13/18 0712    Subjective  moving a little better.     Pain Score  3     Pain Location  Back    Pain Orientation  Right    Pain Descriptors / Indicators  Spasm;Sore    Pain Type  Surgical pain    Pain Onset  More than a month ago    Pain Frequency  Constant         [facility] PT Assessment - 05/13/18 0001      Standardized Balance Assessment   Standardized Balance Assessment  Berg Balance Test      Berg Balance Test   Sit to Stand  Able to stand  independently using hands    Standing Unsupported  Able to stand 30 seconds unsupported    Sitting with Back Unsupported but Feet Supported on Floor or Stool  Able to sit safely and securely 2 minutes    Stand to Sit  Controls descent by using hands    Transfers  Able to transfer with verbal cueing and /or supervision    Standing Unsupported with Eyes Closed  Able to stand 10 seconds with supervision    Standing Ubsupported with Feet Together  Needs help to attain position and  unable to hold for 15 seconds    From Standing, Reach Forward with Outstretched Arm  Can reach forward >12 cm safely (5")    From Standing Position, Pick up Object from Floor  Unable to pick up and needs supervision    From Standing Position, Turn to Look Behind Over each Shoulder  Turn sideways only but maintains balance    Turn 360 Degrees  Needs assistance while turning    Standing Unsupported, Alternately Place Feet on Step/Stool  Able to complete >2 steps/needs minimal assist    Standing Unsupported, One Foot in Front  Loses balance while stepping or standing    Standing on One Leg  Unable to try or needs assist to prevent fall    Total Score  24    Berg comment:  Need of walker  . Discussed with pt.                   [place]  Adult PT Treatment/Exercise - 05/13/18 0001      Knee/Hip Exercises: Aerobic   Nustep  L3 LE 5 moin      Knee/Hip Exercises: Seated   Sit to Sand  with UE support;10 reps cued to pull hips with buttock up and forward      Knee/Hip Exercises: Supine   Bridges  Both;15 reps    Other Supine Knee/Hip Exercises  marching knees x 10 RT . LT  then clam bilateral x10 and single x10, then assisted ip adduct/abduct x 15 .  then worked on lifting leg from off to on mat with cues to set abdominals and lifting was soother.              PT Education - 05/13/18 0748    Education Details  use of abdominals to assist leg lift and tuck buttock to facilitate getting to stand  implications of BERG score and decr  risk of fall with walker    Person(s) Educated  Patient    Methods  Explanation    Comprehension  Verbalized understanding;Returned demonstration       PT Short Term Goals - 05/10/18 1157      PT SHORT TERM GOAL #1   Title  She will be independent with initial HEP     Time  4    Period  Weeks    Status  New      PT SHORT TERM GOAL #2   Title  Pt will be able to demo posture and body mechanics as it relates to lumbar spine  with bending and lifting     Time  4    Period  Weeks      PT SHORT TERM GOAL #3   Title  She will improve Berg score to demo safety with walking with SPC    Time  4    Period  Weeks    Status  New      PT SHORT TERM GOAL #4   Title  She will demo correctl flexion of RT leg with walking with SPC without LOB    Time  4    Period  Weeks    Status  New        PT Long Term Goals - 05/10/18 1200      PT LONG TERM GOAL #1   Title  She will be independent with all HEP issued    Time  12    Period  Weeks    Status  New  PT LONG TERM GOAL #2   Title  Pt will tolerate sitting for 1 hour without pain by 04/03/16    Time  12    Period  Weeks    Status  New      PT LONG TERM GOAL #3   Title  Bilat LE strength (except RT ankle)  will improve to 4/5  or better in order to stand and walk for 30 mins with SPC by 04/03/16     Time  12    Period  Weeks    Status  New      PT LONG TERM GOAL #4   Title  She will demo stairs with step over step pattern with one rail safely    Time  12    Period  Weeks    Status  New      PT LONG TERM GOAL #5   Title  FOTO score to improve to   50% limited to demo improved function    Time  12    Period  Weeks    Status  New      Additional Long Term Goals   Additional Long Term Goals  Yes      PT LONG TERM GOAL #6   Title  She will walk in home without device and out of home with [facility] to improve community access    Time  12    Period  Weeks    Status  New      PT LONG TERM GOAL #7   Title  She will tolerate 90 minon feet for home and community activity    Time  12    Period  Weeks    Status  New            Plan - 05/13/18 0707    Clinical Impression Statement  Lifting of RT leg better by end of session. Berg score discussed with pt that use of walker best for decr risk of fall.   [name]     PT Treatment/Interventions  Manual techniques;Patient/family education;Therapeutic exercise;Therapeutic activities;Cryotherapy;Gait training;Stair training    PT Next Visit Plan  Strengthening , gait and balance.  [facility] HEP    Consulted and Agree with Plan of Care  Patient       Patient will benefit from skilled therapeutic intervention in order to improve the following deficits and impairments:  Pain, Postural dysfunction, Difficulty walking, Decreased strength, Decreased activity tolerance  Visit Diagnosis: Spinal stenosis at L4-L5 level  Chronic right-sided low back pain, with sciatica presence unspecified  Difficulty in walking, not elsewhere classified  Muscle weakness (generalized)  Stiffness of right ankle joint     Problem List Patient Active Problem List   Diagnosis Date Noted  . Hyperlipidemia 04/16/2018  . Generalized anxiety disorder with panic attacks 04/16/2018  . AKI  (acute kidney injury) ([place])   . Benign essential HTN   . Depression with anxiety   . Lumbar radiculopathy 03/09/2018  . Paraparesis ([place]) 03/09/2018  . Essential hypertension   . Chronic nonintractable headache   . Leukocytosis   . Acute blood loss anemia   . Postoperative pain   . Generalized weakness   . Spinal stenosis at L4-L5 level 03/04/2018  . Spinal stenosis of lumbar region 12/28/2015  . Chest pain 04/16/2015    Darrel Hoover   PT 05/13/2018, 7:53 AM  [facility] [address] [place], [name], [address] Phone: [phone]   Fax:  [phone]  Name: [name] MRN: [id number] Date of Birth: 1960/11/19

## 2018-05-18 ENCOUNTER — Ambulatory Visit: Payer: 59 | Admitting: Physical Therapy

## 2018-05-18 ENCOUNTER — Encounter: Payer: Self-pay | Admitting: Physical Therapy

## 2018-05-18 DIAGNOSIS — M25671 Stiffness of right ankle, not elsewhere classified: Secondary | ICD-10-CM | POA: Diagnosis not present

## 2018-05-18 DIAGNOSIS — M48061 Spinal stenosis, lumbar region without neurogenic claudication: Secondary | ICD-10-CM

## 2018-05-18 DIAGNOSIS — G8929 Other chronic pain: Secondary | ICD-10-CM | POA: Diagnosis not present

## 2018-05-18 DIAGNOSIS — M545 Low back pain: Secondary | ICD-10-CM | POA: Diagnosis not present

## 2018-05-18 DIAGNOSIS — M6281 Muscle weakness (generalized): Secondary | ICD-10-CM

## 2018-05-18 DIAGNOSIS — R262 Difficulty in walking, not elsewhere classified: Secondary | ICD-10-CM | POA: Diagnosis not present

## 2018-05-18 NOTE — Therapy (Signed)
[place], [name], [address] Phone: [phone]   Fax:  [phone]  Physical Therapy Treatment  Patient Details  Name: [name] MRN: [id number] Date of Birth: 04-15-1961 Referring Provider: [name], MD   Encounter Date: 05/18/2018  PT End of Session - 05/18/18 0807    Visit Number  3    Number of Visits  24    Date for PT Re-Evaluation  08/13/18    Authorization Type  [facility]    PT Start Time  0800    PT Stop Time  0900    PT Time Calculation (min)  60 min       Past Medical History:  Diagnosis Date  . Anxiety   . Back pain    related to spinal stenosis and disc problem, radiates down left buttocks to leg., weakness occ.  [name] Headache   . Hyperlipidemia   . Hypertension   . PONV (postoperative nausea and vomiting)   . Vaginal foreign object    "Uses Femring"    Past Surgical History:  Procedure Laterality Date  . ABDOMINAL HYSTERECTOMY    . CARDIAC CATHETERIZATION N/A 04/18/2015   Procedure: Left Heart Cath and Coronary Angiography;  Surgeon: [name], MD;  Location: [place] [facility];  Service: Cardiovascular;  Laterality: N/A;  . FOOT SURGERY Bilateral    [place] "bunion,bone spur, tendon" (1) -6'16, (1)-10'16  . LUMBAR LAMINECTOMY/DECOMPRESSION MICRODISCECTOMY Bilateral 12/28/2015   Procedure: MICRO LUMBAR DECOMPRESSION L4 - L5 BILATERALLY;  Surgeon: [name], MD;  [facility];  Service: Orthopedics;  Laterality: Bilateral;  . LUMBAR LAMINECTOMY/DECOMPRESSION MICRODISCECTOMY Bilateral 03/04/2018   Procedure: Revision of Microlumbar Decompression Bilateral Lumbar Four-Five;  Surgeon: [name], MD;  Location: [place];  Service: Orthopedics;  Laterality: Bilateral;  90 mins  . WISDOM TOOTH EXTRACTION    . WOUND EXPLORATION N/A 03/04/2018   Procedure: EXPLORATION OF LUMBAR DECOMPRESSION WOUND;  Surgeon: [name], MD;  Location: [place];  Service:  Orthopedics;  Laterality: N/A;    There were no vitals filed for this visit.  Subjective Assessment - 05/18/18 0805    Subjective  My back is sore. I fell getting out of the shower, but caught myself. I think I pulled something on the right side.     Currently in Pain?  Yes    Pain Score  5     Pain Location  Back    Pain Orientation  Right    Pain Descriptors / Indicators  Sore    Aggravating Factors   falling, bending, standing, lifting kitchen items     Pain Relieving Factors  meds, heat to back,                        [facility] Adult PT Treatment/Exercise - 05/18/18 0001      Knee/Hip Exercises: Aerobic   Nustep  L2 LE only x 5 min      Knee/Hip Exercises: Supine   Bridges  Both;15 reps    Other Supine Knee/Hip Exercises  marching x 10 each , clam bilateral, clam unilaterals, ball squeeze       Modalities   Modalities  Moist Heat      Moist Heat Therapy   Number Minutes Moist Heat  15 Minutes    Moist Heat Location  Lumbar Spine seated          Balance Exercises - 05/18/18 1194  Balance Exercises: Standing   Standing Eyes Opened  Narrow base of support (BOS);Wide (BOA);Head turns;Solid surface 2 finger support, CGA    Standing Eyes Closed  Narrow base of support (BOS);Wide (BOA) 2 finger support CGA    Tandem Stance  Eyes open;Eyes closed staggered stand, CGA    Step Ups  -- alternating step taps with bilateral UE support          PT Short Term Goals - 05/10/18 1157      PT SHORT TERM GOAL #1   Title  She will be independent with initial HEP     Time  4    Period  Weeks    Status  New      PT SHORT TERM GOAL #2   Title  Pt will be able to demo posture and body mechanics as it relates to lumbar spine  with bending and lifting     Time  4    Period  Weeks      PT SHORT TERM GOAL #3   Title  She will improve Berg score to demo safety with walking with SPC    Time  4    Period  Weeks    Status  New      PT SHORT TERM GOAL #4    Title  She will demo correctl flexion of RT leg with walking with SPC without LOB    Time  4    Period  Weeks    Status  New        PT Long Term Goals - 05/10/18 1200      PT LONG TERM GOAL #1   Title  She will be independent with all HEP issued    Time  12    Period  Weeks    Status  New      PT LONG TERM GOAL #2   Title  Pt will tolerate sitting for 1 hour without pain by 04/03/16    Time  12    Period  Weeks    Status  New      PT LONG TERM GOAL #3   Title  Bilat LE strength (except RT ankle)  will improve to 4/5 or better in order to stand and walk for 30 mins with SPC by 04/03/16     Time  12    Period  Weeks    Status  New      PT LONG TERM GOAL #4   Title  She will demo stairs with step over step pattern with one rail safely    Time  12    Period  Weeks    Status  New      PT LONG TERM GOAL #5   Title  FOTO score to improve to   50% limited to demo improved function    Time  12    Period  Weeks    Status  New      Additional Long Term Goals   Additional Long Term Goals  Yes      PT LONG TERM GOAL #6   Title  She will walk in home without device and out of home with [facility] to improve community access    Time  12    Period  Weeks    Status  New      PT LONG TERM GOAL #7   Title  She will tolerate 90 minon feet for home and community activity    Time  [place]    Status  New            Plan - 05/18/18 0840    Clinical Impression Statement  She reports a fall this morning and she was able to catch herself. However, she has right low back soreness afterward. Worked on balance in parallel bars. Needs 2 finger support to maintain balance with most exercises. Repeated Core mat exercises. Hmp at end of session to decrease soreness.     PT Next Visit Plan  Strengthening , gait and balance.  [facility] HEP    Consulted and Agree with Plan of Care  Patient       Patient will benefit from skilled therapeutic intervention in order to improve the  following deficits and impairments:  Pain, Postural dysfunction, Difficulty walking, Decreased strength, Decreased activity tolerance  Visit Diagnosis: Spinal stenosis at L4-L5 level  Chronic right-sided low back pain, with sciatica presence unspecified  Muscle weakness (generalized)  Difficulty in walking, not elsewhere classified  Stiffness of right ankle joint     Problem List Patient Active Problem List   Diagnosis Date Noted  . Hyperlipidemia 04/16/2018  . Generalized anxiety disorder with panic attacks 04/16/2018  . AKI (acute kidney injury) ([place])   . Benign essential HTN   . Depression with anxiety   . Lumbar radiculopathy 03/09/2018  . Paraparesis ([place]) 03/09/2018  . Essential hypertension   . Chronic nonintractable headache   . Leukocytosis   . Acute blood loss anemia   . Postoperative pain   . Generalized weakness   . Spinal stenosis at L4-L5 level 03/04/2018  . Spinal stenosis of lumbar region 12/28/2015  . Chest pain 04/16/2015    Dorene Ar, PTA 05/18/2018, 9:02 AM  [place], [name], [address] Phone: [phone]   Fax:  [phone]  Name: [name] MRN: [id number] Date of Birth: 14-May-1961

## 2018-05-19 DIAGNOSIS — M48061 Spinal stenosis, lumbar region without neurogenic claudication: Secondary | ICD-10-CM | POA: Diagnosis not present

## 2018-05-19 DIAGNOSIS — R269 Unspecified abnormalities of gait and mobility: Secondary | ICD-10-CM | POA: Diagnosis not present

## 2018-05-20 ENCOUNTER — Other Ambulatory Visit: Payer: Self-pay | Admitting: Physical Medicine and Rehabilitation

## 2018-05-20 ENCOUNTER — Ambulatory Visit: Payer: 59

## 2018-05-20 ENCOUNTER — Other Ambulatory Visit: Payer: Self-pay

## 2018-05-20 ENCOUNTER — Other Ambulatory Visit: Payer: Self-pay | Admitting: Family Medicine

## 2018-05-20 DIAGNOSIS — M545 Low back pain: Secondary | ICD-10-CM

## 2018-05-20 DIAGNOSIS — R262 Difficulty in walking, not elsewhere classified: Secondary | ICD-10-CM

## 2018-05-20 DIAGNOSIS — M6281 Muscle weakness (generalized): Secondary | ICD-10-CM | POA: Diagnosis not present

## 2018-05-20 DIAGNOSIS — M48061 Spinal stenosis, lumbar region without neurogenic claudication: Secondary | ICD-10-CM

## 2018-05-20 DIAGNOSIS — M25671 Stiffness of right ankle, not elsewhere classified: Secondary | ICD-10-CM | POA: Diagnosis not present

## 2018-05-20 DIAGNOSIS — G8929 Other chronic pain: Secondary | ICD-10-CM | POA: Diagnosis not present

## 2018-05-20 MED ORDER — CARVEDILOL 6.25 MG PO TABS: 6.2500 mg | ORAL_TABLET | Freq: Two times a day (BID) | ORAL | 0 refills | Status: DC

## 2018-05-20 MED FILL — PREMARIN 0.625 MG TABLET: 0.625 | 30 days supply | Qty: 30 | Fill #3

## 2018-05-20 MED FILL — CARVEDILOL 6.25 MG TABLET: 6.25 | 30 days supply | Qty: 60 | Fill #0

## 2018-05-20 NOTE — Telephone Encounter (Signed)
Copied from [place] [id number]. Topic: Quick Communication - Rx Refill/Question >> May 20, 2018 12:44 PM Burchel, Abbi R wrote: Medication: carvedilol (COREG) 6.25 MG tablet   Preferred Pharmacy:[facility] [place], [name] - [address] [name] [address] Phone: [phone] Fax: [phone]  Pt requesting refill

## 2018-05-20 NOTE — Telephone Encounter (Signed)
Rx refilled. No further action needed!

## 2018-05-20 NOTE — Therapy (Signed)
[place], [name], [address] Phone: [phone]   Fax:  [phone]  Physical Therapy Treatment  Patient Details  Name: [name] MRN: [id number] Date of Birth: 27-Nov-1960 Referring Provider: [name], MD   Encounter Date: 05/20/2018  PT End of Session - 05/20/18 0855    Visit Number  4    Number of Visits  24    Date for PT Re-Evaluation  08/13/18    Authorization Type  [facility]    PT Start Time  0850    PT Stop Time  0930    PT Time Calculation (min)  40 min    Activity Tolerance  Patient tolerated treatment well    Behavior During Therapy  [facility] for tasks assessed/performed       Past Medical History:  Diagnosis Date  . Anxiety   . Back pain    related to spinal stenosis and disc problem, radiates down left buttocks to leg., weakness occ.  [name] Headache   . Hyperlipidemia   . Hypertension   . PONV (postoperative nausea and vomiting)   . Vaginal foreign object    "Uses Femring"    Past Surgical History:  Procedure Laterality Date  . ABDOMINAL HYSTERECTOMY    . CARDIAC CATHETERIZATION N/A 04/18/2015   Procedure: Left Heart Cath and Coronary Angiography;  Surgeon: [name], MD;  Location: [place] [facility];  Service: Cardiovascular;  Laterality: N/A;  . FOOT SURGERY Bilateral    [place] "bunion,bone spur, tendon" (1) -6'16, (1)-10'16  . LUMBAR LAMINECTOMY/DECOMPRESSION MICRODISCECTOMY Bilateral 12/28/2015   Procedure: MICRO LUMBAR DECOMPRESSION L4 - L5 BILATERALLY;  Surgeon: [name], MD;  [facility];  Service: Orthopedics;  Laterality: Bilateral;  . LUMBAR LAMINECTOMY/DECOMPRESSION MICRODISCECTOMY Bilateral 03/04/2018   Procedure: Revision of Microlumbar Decompression Bilateral Lumbar Four-Five;  Surgeon: [name], MD;  Location: [place];  Service: Orthopedics;  Laterality: Bilateral;  90 mins  . WISDOM TOOTH EXTRACTION    . WOUND EXPLORATION N/A  03/04/2018   Procedure: EXPLORATION OF LUMBAR DECOMPRESSION WOUND;  Surgeon: [name], MD;  Location: [place];  Service: Orthopedics;  Laterality: N/A;    There were no vitals filed for this visit.  Subjective Assessment - 05/20/18 0854    Subjective  Back still sore.      Pain Score  4     Pain Location  Back    Pain Orientation  Right    Pain Onset  More than a month ago    Pain Frequency  Constant    Aggravating Factors   bend , stand  , Lifting    Pain Relieving Factors  heat , meds                       [facility] Adult PT Treatment/Exercise - 05/20/18 0001      Ambulation/Gait   Gait Comments  No device/ UE support with CGA with minimal instancxes ofinstability needing some PT assist, work on balance , weight shifting and flexion Rt leg.       Neuro Re-ed    Neuro Re-ed Details   Tandem standing with trunk rotation and arms overhead . Working oin RT Le felxion with foot placement heel strike   With CGA.       Knee/Hip Exercises: Aerobic   Nustep  L4 LE only x 5 min      Knee/Hip Exercises: Standing   Other Standing Knee Exercises  Side  stepping with cuing to incr lenght of step, walking forward march to facilitae flexion during swing phase and backward for RT LE flexion knee with hip extension. , minisquats withut UE support (all done in bars.                PT Short Term Goals - 05/10/18 1157      PT SHORT TERM GOAL #1   Title  She will be independent with initial HEP     Time  4    Period  Weeks    Status  New      PT SHORT TERM GOAL #2   Title  Pt will be able to demo posture and body mechanics as it relates to lumbar spine  with bending and lifting     Time  4    Period  Weeks      PT SHORT TERM GOAL #3   Title  She will improve Berg score to demo safety with walking with SPC    Time  4    Period  Weeks    Status  New      PT SHORT TERM GOAL #4   Title  She will demo correctl flexion of RT leg with walking with SPC without LOB     Time  4    Period  Weeks    Status  New        PT Long Term Goals - 05/20/18 0856      PT LONG TERM GOAL #1   Title  She will be independent with all HEP issued    Time  12    Period  Weeks    Status  New      PT LONG TERM GOAL #2   Title  Pt will tolerate sitting for 1 hour without pain by 04/03/16    Time  12    Period  Weeks    Status  New      PT LONG TERM GOAL #4   Title  She will demo stairs with step over step pattern with one rail safely    Time  12    Period  Weeks    Status  New      PT LONG TERM GOAL #5   Title  FOTO score to improve to   50% limited to demo improved function    Time  12    Period  Weeks    Status  New      PT LONG TERM GOAL #6   Title  She will walk in home without device and out of home with [facility] to improve community access    Time  12    Period  Weeks    Status  New      PT LONG TERM GOAL #7   Title  She will tolerate 90 min on feet for home and community activity    Time  12    Period  Weeks    Status  New            Plan - 05/20/18 4696    Clinical Impression Statement  Worked on feet for exerciss today and addressed balance . Her gait pattern was better after session. no incr pain . Continue strength Sharma Covert /gait    PT Treatment/Interventions  Manual techniques;Patient/family education;Therapeutic exercise;Therapeutic activities;Cryotherapy;Gait training;Stair training    PT Next Visit Plan  Strengthening , gait and balance.  [facility] HHPT HEP    Consulted and Agree  with Plan of Care  Patient       Patient will benefit from skilled therapeutic intervention in order to improve the following deficits and impairments:  Pain, Postural dysfunction, Difficulty walking, Decreased strength, Decreased activity tolerance  Visit Diagnosis: Spinal stenosis at L4-L5 level  Chronic right-sided low back pain, with sciatica presence unspecified  Muscle weakness (generalized)  Difficulty in walking, not elsewhere  classified     Problem List Patient Active Problem List   Diagnosis Date Noted  . Hyperlipidemia 04/16/2018  . Generalized anxiety disorder with panic attacks 04/16/2018  . AKI (acute kidney injury) ([place])   . Benign essential HTN   . Depression with anxiety   . Lumbar radiculopathy 03/09/2018  . Paraparesis ([place]) 03/09/2018  . Essential hypertension   . Chronic nonintractable headache   . Leukocytosis   . Acute blood loss anemia   . Postoperative pain   . Generalized weakness   . Spinal stenosis at L4-L5 level 03/04/2018  . Spinal stenosis of lumbar region 12/28/2015  . Chest pain 04/16/2015    Darrel Hoover  PT 05/20/2018, 10:34 AM  [facility] [address] [place], [name], [address] Phone: [phone]   Fax:  [phone]  Name: [name] MRN: [id number] Date of Birth: [birth year]

## 2018-05-25 ENCOUNTER — Encounter: Payer: Self-pay | Admitting: Physical Therapy

## 2018-05-25 ENCOUNTER — Ambulatory Visit: Payer: 59 | Admitting: Physical Therapy

## 2018-05-25 DIAGNOSIS — M6281 Muscle weakness (generalized): Secondary | ICD-10-CM | POA: Diagnosis not present

## 2018-05-25 DIAGNOSIS — M545 Low back pain: Secondary | ICD-10-CM | POA: Diagnosis not present

## 2018-05-25 DIAGNOSIS — M48061 Spinal stenosis, lumbar region without neurogenic claudication: Secondary | ICD-10-CM

## 2018-05-25 DIAGNOSIS — G8929 Other chronic pain: Secondary | ICD-10-CM

## 2018-05-25 DIAGNOSIS — R262 Difficulty in walking, not elsewhere classified: Secondary | ICD-10-CM

## 2018-05-25 DIAGNOSIS — M25671 Stiffness of right ankle, not elsewhere classified: Secondary | ICD-10-CM | POA: Diagnosis not present

## 2018-05-25 NOTE — Patient Instructions (Signed)
Issued from exercise drawer. Transversus Abdominus  First 2 exercises issued daily !0 X each

## 2018-05-25 NOTE — Therapy (Signed)
[place], [name], [address] Phone: [phone]   Fax:  [phone]  Physical Therapy Treatment  Patient Details  Name: [name] MRN: [id number] Date of Birth: 1961-09-25 Referring Provider: [name], MD   Encounter Date: 05/25/2018  PT End of Session - 05/25/18 1039    Visit Number  5    Number of Visits  24    Date for PT Re-Evaluation  08/13/18    PT Start Time  0930    PT Stop Time  1035    PT Time Calculation (min)  65 min    Activity Tolerance  Patient tolerated treatment well    Behavior During Therapy  [facility] for tasks assessed/performed       Past Medical History:  Diagnosis Date  . Anxiety   . Back pain    related to spinal stenosis and disc problem, radiates down left buttocks to leg., weakness occ.  [name] Headache   . Hyperlipidemia   . Hypertension   . PONV (postoperative nausea and vomiting)   . Vaginal foreign object    "Uses Femring"    Past Surgical History:  Procedure Laterality Date  . ABDOMINAL HYSTERECTOMY    . CARDIAC CATHETERIZATION N/A 04/18/2015   Procedure: Left Heart Cath and Coronary Angiography;  Surgeon: [name], MD;  Location: [facility];  Service: Cardiovascular;  Laterality: N/A;  . FOOT SURGERY Bilateral    [place] "bunion,bone spur, tendon" (1) -6'16, (1)-10'16  . LUMBAR LAMINECTOMY/DECOMPRESSION MICRODISCECTOMY Bilateral 12/28/2015   Procedure: MICRO LUMBAR DECOMPRESSION L4 - L5 BILATERALLY;  Surgeon: [name], MD;  [facility]: [facility];  Service: Orthopedics;  Laterality: Bilateral;  . LUMBAR LAMINECTOMY/DECOMPRESSION MICRODISCECTOMY Bilateral 03/04/2018   Procedure: Revision of Microlumbar Decompression Bilateral Lumbar Four-Five;  Surgeon: [name], MD;  Location: [place];  Service: Orthopedics;  Laterality: Bilateral;  90 mins  . WISDOM TOOTH EXTRACTION    . WOUND EXPLORATION N/A 03/04/2018   Procedure: EXPLORATION OF  LUMBAR DECOMPRESSION WOUND;  Surgeon: [name], MD;  Location: [place];  Service: Orthopedics;  Laterality: N/A;    There were no vitals filed for this visit.  Subjective Assessment - 05/25/18 0938    Subjective  4/10  right side.  i did not do anything different.  Pots and pans on counter better. Uses walker in community.  uses cane and sometimes wheelchair at home.  Is able to cook a little.      Currently in Pain?  Yes    Pain Score  4     Pain Location  Back    Pain Orientation  Right    Pain Descriptors / Indicators  Sore    Pain Frequency  Intermittent    Aggravating Factors   a wrong move,  twisting to get something off floor    Pain Relieving Factors  hook lying, legs elevated,  pain pills    Effect of Pain on Daily Activities  Help with ADL's     Multiple Pain Sites  No                       [facility] Adult PT Treatment/Exercise - 05/25/18 0001      Self-Care   Self-Care  -- sidelying posture with folded towel at waist to straighten       Neuro Re-ed    Neuro Re-ed Details   static standing several attempts.  without posterior knee support or hands  tends to lose balance backwards.       Knee/Hip Exercises: Stretches   Passive Hamstring Stretch  2 reps;20 seconds limited stretching felt like nerve pain so stopped    Piriformis Stretch  3 reps;30 seconds    Other Knee/Hip Stretches  single knee to chest 5 X  gentle      Knee/Hip Exercises: Seated   Sit to Sand  -- attempted increased pain      Modalities   Modalities  Moist Heat      Moist Heat Therapy   Number Minutes Moist Heat  15 Minutes    Moist Heat Location  Lumbar Spine      Manual Therapy   Manual therapy comments  instrument assist  soft tissue paraspinals and gluteal right tissue softened,  pain reduced.              PT Education - 05/25/18 1038    Education Details  HEP,  how to decrease spasms with stretching    Person(s) Educated  Patient    Methods  Explanation;Verbal  cues;Handout    Comprehension  Verbalized understanding;Returned demonstration       PT Short Term Goals - 05/10/18 1157      PT SHORT TERM GOAL #1   Title  She will be independent with initial HEP     Time  4    Period  Weeks    Status  New      PT SHORT TERM GOAL #2   Title  Pt will be able to demo posture and body mechanics as it relates to lumbar spine  with bending and lifting     Time  4    Period  Weeks      PT SHORT TERM GOAL #3   Title  She will improve Berg score to demo safety with walking with SPC    Time  4    Period  Weeks    Status  New      PT SHORT TERM GOAL #4   Title  She will demo correctl flexion of RT leg with walking with SPC without LOB    Time  4    Period  Weeks    Status  New        PT Long Term Goals - 05/20/18 0856      PT LONG TERM GOAL #1   Title  She will be independent with all HEP issued    Time  12    Period  Weeks    Status  New      PT LONG TERM GOAL #2   Title  Pt will tolerate sitting for 1 hour without pain by 04/03/16    Time  12    Period  Weeks    Status  New      PT LONG TERM GOAL #4   Title  She will demo stairs with step over step pattern with one rail safely    Time  12    Period  Weeks    Status  New      PT LONG TERM GOAL #5   Title  FOTO score to improve to   50% limited to demo improved function    Time  12    Period  Weeks    Status  New      PT LONG TERM GOAL #6   Title  She will walk in home without device and out of home with [facility] to improve  community access    Time  12    Period  Weeks    Status  New      PT LONG TERM GOAL #7   Title  She will tolerate 90 min on feet for home and community activity    Time  12    Period  Weeks    Status  New            Plan - 05/25/18 1042    Clinical Impression Statement  Focus today on strength.  Standing balance without hands limited to less than 10 seconds.  She requires the use of hands for sit to stand.  She was able to progress her HEP .  manual  helpful and heat heplful for pain.     PT Next Visit Plan  Strengthening , gait and balance.  Review HHPT HEP    PT Home Exercise Plan  transversus abdominus.     Consulted and Agree with Plan of Care  Patient       Patient will benefit from skilled therapeutic intervention in order to improve the following deficits and impairments:     Visit Diagnosis: Spinal stenosis at L4-L5 level  Chronic right-sided low back pain, with sciatica presence unspecified  Muscle weakness (generalized)  Difficulty in walking, not elsewhere classified     Problem List Patient Active Problem List   Diagnosis Date Noted  . Hyperlipidemia 04/16/2018  . Generalized anxiety disorder with panic attacks 04/16/2018  . AKI (acute kidney injury) ([place])   . Benign essential HTN   . Depression with anxiety   . Lumbar radiculopathy 03/09/2018  . Paraparesis ([place]) 03/09/2018  . Essential hypertension   . Chronic nonintractable headache   . Leukocytosis   . Acute blood loss anemia   . Postoperative pain   . Generalized weakness   . Spinal stenosis at L4-L5 level 03/04/2018  . Spinal stenosis of lumbar region 12/28/2015  . Chest pain 04/16/2015    Phelan Goers  PTA 05/25/2018, 10:54 AM  [facility] [address] [place], [name], [address] Phone: [phone]   Fax:  [phone]  Name: [name] MRN: [id number] Date of Birth: 09/15/1961

## 2018-05-27 ENCOUNTER — Encounter: Payer: Self-pay | Admitting: Physical Therapy

## 2018-05-27 ENCOUNTER — Ambulatory Visit: Payer: 59 | Attending: Specialist | Admitting: Physical Therapy

## 2018-05-27 DIAGNOSIS — M6281 Muscle weakness (generalized): Secondary | ICD-10-CM | POA: Diagnosis not present

## 2018-05-27 DIAGNOSIS — F418 Other specified anxiety disorders: Secondary | ICD-10-CM | POA: Diagnosis not present

## 2018-05-27 DIAGNOSIS — M545 Low back pain: Secondary | ICD-10-CM | POA: Insufficient documentation

## 2018-05-27 DIAGNOSIS — G8929 Other chronic pain: Secondary | ICD-10-CM | POA: Insufficient documentation

## 2018-05-27 DIAGNOSIS — R262 Difficulty in walking, not elsewhere classified: Secondary | ICD-10-CM | POA: Insufficient documentation

## 2018-05-27 DIAGNOSIS — M48061 Spinal stenosis, lumbar region without neurogenic claudication: Secondary | ICD-10-CM | POA: Diagnosis not present

## 2018-05-27 DIAGNOSIS — M25671 Stiffness of right ankle, not elsewhere classified: Secondary | ICD-10-CM | POA: Diagnosis not present

## 2018-05-27 NOTE — Therapy (Signed)
[place], [name], [address] Phone: [phone]   Fax:  [phone]  Physical Therapy Treatment  Patient Details  Name: [name] MRN: [id number] Date of Birth: 20-Aug-1961 Referring Provider: [name], MD   Encounter Date: 05/27/2018  PT End of Session - 05/27/18 0823    Visit Number  6    Number of Visits  24    Date for PT Re-Evaluation  08/13/18    PT Start Time  0736    PT Stop Time  0800    PT Time Calculation (min)  24 min    Activity Tolerance  Patient tolerated treatment well    Behavior During Therapy  [facility] for tasks assessed/performed       Past Medical History:  Diagnosis Date  . Anxiety   . Back pain    related to spinal stenosis and disc problem, radiates down left buttocks to leg., weakness occ.  [name] Headache   . Hyperlipidemia   . Hypertension   . PONV (postoperative nausea and vomiting)   . Vaginal foreign object    "Uses Femring"    Past Surgical History:  Procedure Laterality Date  . ABDOMINAL HYSTERECTOMY    . CARDIAC CATHETERIZATION N/A 04/18/2015   Procedure: Left Heart Cath and Coronary Angiography;  Surgeon: [name], MD;  Location: [facility];  Service: Cardiovascular;  Laterality: N/A;  . FOOT SURGERY Bilateral    [place] "bunion,bone spur, tendon" (1) -6'16, (1)-10'16  . LUMBAR LAMINECTOMY/DECOMPRESSION MICRODISCECTOMY Bilateral 12/28/2015   Procedure: MICRO LUMBAR DECOMPRESSION L4 - L5 BILATERALLY;  Surgeon: [name], MD;  [facility];  Service: Orthopedics;  Laterality: Bilateral;  . LUMBAR LAMINECTOMY/DECOMPRESSION MICRODISCECTOMY Bilateral 03/04/2018   Procedure: Revision of Microlumbar Decompression Bilateral Lumbar Four-Five;  Surgeon: [name], MD;  Location: [place];  Service: Orthopedics;  Laterality: Bilateral;  90 mins  . WISDOM TOOTH EXTRACTION    . WOUND EXPLORATION N/A 03/04/2018   Procedure: EXPLORATION OF  LUMBAR DECOMPRESSION WOUND;  Surgeon: [name], MD;  Location: [place];  Service: Orthopedics;  Laterality: N/A;    There were no vitals filed for this visit.  Subjective Assessment - 05/27/18 0812    Subjective  Stiff and sore from the weather.  vs pain.  I have been doing the exercises.    Currently in Pain?  Yes    Pain Score  3     Pain Location  Back    Pain Orientation  Right    Pain Descriptors / Indicators  Sore stiff    Pain Type  Other (Comment)    Pain Frequency  Intermittent    Aggravating Factors   weather changes    Pain Relieving Factors  moving around    Effect of Pain on Daily Activities  slow morning    Multiple Pain Sites  No                       [facility] Adult PT Treatment/Exercise - 05/27/18 0001      Self-Care   Self-Care  Other Self-Care Comments    Other Self-Care Comments   ankle anatomy,  hip flexor anatomy and how it relates to back      Lumbar Exercises: Supine   Ab Set  10 reps with TrA      Knee/Hip Exercises: Stretches   Gastroc Stretch  3 reps;30 seconds supine PROM and standing 5 X  Knee/Hip Exercises: Standing   Hip Flexion  10 reps;Left while holding walker.      Knee/Hip Exercises: Supine   Other Supine Knee/Hip Exercises  bent knee hip flexion 5 X lift,  5 X lewering AA needed     Other Supine Knee/Hip Exercises  ball squeeze 10 X 1 breath      Manual Therapy   Manual Therapy  Joint mobilization;Passive ROM    Joint Mobilization  ankle, drawer grade II/III with movement for DF    Passive ROM  right foot  able to get foot to 90              PT Education - 05/27/18 0811    Education Details  HEP,  anatomy    Person(s) Educated  Patient    Methods  Explanation;Demonstration;Verbal cues;Handout    Comprehension  Verbalized understanding;Returned demonstration       PT Short Term Goals - 05/10/18 1157      PT SHORT TERM GOAL #1   Title  She will be independent with initial HEP     Time  4    Period   Weeks    Status  New      PT SHORT TERM GOAL #2   Title  Pt will be able to demo posture and body mechanics as it relates to lumbar spine  with bending and lifting     Time  4    Period  Weeks      PT SHORT TERM GOAL #3   Title  She will improve Berg score to demo safety with walking with SPC    Time  4    Period  Weeks    Status  New      PT SHORT TERM GOAL #4   Title  She will demo correctl flexion of RT leg with walking with SPC without LOB    Time  4    Period  Weeks    Status  New        PT Long Term Goals - 05/20/18 0856      PT LONG TERM GOAL #1   Title  She will be independent with all HEP issued    Time  12    Period  Weeks    Status  New      PT LONG TERM GOAL #2   Title  Pt will tolerate sitting for 1 hour without pain by 04/03/16    Time  12    Period  Weeks    Status  New      PT LONG TERM GOAL #4   Title  She will demo stairs with step over step pattern with one rail safely    Time  12    Period  Weeks    Status  New      PT LONG TERM GOAL #5   Title  FOTO score to improve to   50% limited to demo improved function    Time  12    Period  Weeks    Status  New      PT LONG TERM GOAL #6   Title  She will walk in home without device and out of home with [facility] to improve community access    Time  12    Period  Weeks    Status  New      PT LONG TERM GOAL #7   Title  She will tolerate 90 min on feet for home  and community activity    Time  12    Period  Weeks    Status  New            Plan - 05/27/18 0321    Clinical Impression Statement  Able to get right ankle to 90 PROM.  No pain with exercises today.  Modalities declined. She is compliant with HEP.    PT Next Visit Plan  Strengthening , gait and balance.  Review HHPT HEP    PT Home Exercise Plan  transversus abdominus. standing weightbearing and standing left hip flexion    Consulted and Agree with Plan of Care  Patient       Patient will benefit from skilled therapeutic intervention  in order to improve the following deficits and impairments:     Visit Diagnosis: Spinal stenosis at L4-L5 level  Chronic right-sided low back pain, with sciatica presence unspecified  Muscle weakness (generalized)  Difficulty in walking, not elsewhere classified  Stiffness of right ankle joint     Problem List Patient Active Problem List   Diagnosis Date Noted  . Hyperlipidemia 04/16/2018  . Generalized anxiety disorder with panic attacks 04/16/2018  . AKI (acute kidney injury) ([place])   . Benign essential HTN   . Depression with anxiety   . Lumbar radiculopathy 03/09/2018  . Paraparesis ([place]) 03/09/2018  . Essential hypertension   . Chronic nonintractable headache   . Leukocytosis   . Acute blood loss anemia   . Postoperative pain   . Generalized weakness   . Spinal stenosis at L4-L5 level 03/04/2018  . Spinal stenosis of lumbar region 12/28/2015  . Chest pain 04/16/2015    Vivienne Sangiovanni PTA 05/27/2018, 8:28 AM  [facility] [address] [place], [name], [address] Phone: [phone]   Fax:  [phone]  Name: [name] MRN: [id number] Date of Birth: 14-Aug-1961

## 2018-05-27 NOTE — Patient Instructions (Addendum)
Stand equal weight on each foot stretching ankles. 5 X 30 seconds 2-3 x a day.    Try lifting left leg holding walker 10 x 2-3 X a day

## 2018-06-01 ENCOUNTER — Ambulatory Visit: Payer: 59 | Admitting: Physical Therapy

## 2018-06-01 DIAGNOSIS — M6281 Muscle weakness (generalized): Secondary | ICD-10-CM

## 2018-06-01 DIAGNOSIS — M48061 Spinal stenosis, lumbar region without neurogenic claudication: Secondary | ICD-10-CM

## 2018-06-01 DIAGNOSIS — F418 Other specified anxiety disorders: Secondary | ICD-10-CM | POA: Diagnosis not present

## 2018-06-01 DIAGNOSIS — M25671 Stiffness of right ankle, not elsewhere classified: Secondary | ICD-10-CM | POA: Diagnosis not present

## 2018-06-01 DIAGNOSIS — R262 Difficulty in walking, not elsewhere classified: Secondary | ICD-10-CM

## 2018-06-01 DIAGNOSIS — G8929 Other chronic pain: Secondary | ICD-10-CM | POA: Diagnosis not present

## 2018-06-01 DIAGNOSIS — M545 Low back pain: Secondary | ICD-10-CM | POA: Diagnosis not present

## 2018-06-01 NOTE — Therapy (Signed)
[place], [name], [address] Phone: [phone]   Fax:  [phone]  Physical Therapy Treatment  Patient Details  Name: [name] MRN: [id number] Date of Birth: Jan 19, 1961 Referring Provider: [name], MD   Encounter Date: 06/01/2018  PT End of Session - 06/01/18 1518    Visit Number  7    Number of Visits  24    Date for PT Re-Evaluation  08/13/18    PT Start Time  3474    PT Stop Time  1500    PT Time Calculation (min)  45 min    Activity Tolerance  Patient tolerated treatment well    Behavior During Therapy  [facility] for tasks assessed/performed       Past Medical History:  Diagnosis Date  . Anxiety   . Back pain    related to spinal stenosis and disc problem, radiates down left buttocks to leg., weakness occ.  [name] Headache   . Hyperlipidemia   . Hypertension   . PONV (postoperative nausea and vomiting)   . Vaginal foreign object    "Uses Femring"    Past Surgical History:  Procedure Laterality Date  . ABDOMINAL HYSTERECTOMY    . CARDIAC CATHETERIZATION N/A 04/18/2015   Procedure: Left Heart Cath and Coronary Angiography;  Surgeon: [name], MD;  Location: [place] [facility];  Service: Cardiovascular;  Laterality: N/A;  . FOOT SURGERY Bilateral    [place] "bunion,bone spur, tendon" (1) -6'16, (1)-10'16  . LUMBAR LAMINECTOMY/DECOMPRESSION MICRODISCECTOMY Bilateral 12/28/2015   Procedure: MICRO LUMBAR DECOMPRESSION L4 - L5 BILATERALLY;  Surgeon: [name], MD;  [facility];  Service: Orthopedics;  Laterality: Bilateral;  . LUMBAR LAMINECTOMY/DECOMPRESSION MICRODISCECTOMY Bilateral 03/04/2018   Procedure: Revision of Microlumbar Decompression Bilateral Lumbar Four-Five;  Surgeon: [name], MD;  Location: [place];  Service: Orthopedics;  Laterality: Bilateral;  90 mins  . WISDOM TOOTH EXTRACTION    . WOUND EXPLORATION N/A 03/04/2018   Procedure: EXPLORATION OF  LUMBAR DECOMPRESSION WOUND;  Surgeon: [name], MD;  Location: [place];  Service: Orthopedics;  Laterality: N/A;    There were no vitals filed for this visit.  Subjective Assessment - 06/01/18 1526    Subjective  No pain today.  I saw the MD and he wanted more PT.  I have been doing the exercises.  I am depressed and the MD suggested counseling. 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     Currently in Pain?  No/denies    Pain Score  -- sometimes a 3/10,    Pain Location  Back    Pain Orientation  Right    Pain Descriptors / Indicators  Sore foot is numb since surgery    Pain Frequency  Intermittent    Aggravating Factors   moving wrong,  weather    Pain Relieving Factors  moving around ,  stretches    Effect of Pain on Daily Activities  did not say    Multiple Pain Sites  No         [facility] PT Assessment - 06/01/18 0001      Strength   Right Knee Extension  3/5                   [facility] Adult PT Treatment/Exercise - 06/01/18 0001      Therapeutic Activites    Therapeutic Activities  -- Patient was able to  get in / off mat SBA.      Lumbar Exercises: Stretches   Other Lumbar Stretch Exercise  quadratus lumborum stretch on side with hip depression and arm reach to lift ribs,  alsp hip posterior stretch to quadrstus PROM,  AAROM to decrease spasm.      Lumbar Exercises: Supine   Pelvic Tilt  5 reps cued technique,  increased pain right low back    Glut Set  10 reps cued initially    Clam  10 reps yellow band cued abdominals      Knee/Hip Exercises: Aerobic   Recumbent Bike  5 minutes unable to turn bike on      Knee/Hip Exercises: Standing   Hip Flexion  10 reps;Both CGA for safety    Forward Step Up  5 reps with walkergait belt.  Up left down right,  4 inch step    Functional Squat  5 reps cued, small motions  CGA to min assist,  balance    Other Standing Knee Exercises  side stepping along counter CGA,  extra time leading right hip       Knee/Hip Exercises: Seated   Long Arc Quad  5 reps cued technique able to have full knee extension    Ball Squeeze  10 reps      Knee/Hip Exercises: Supine   Heel Slides  10 reps independent      Knee/Hip Exercises: Sidelying   Clams  5 reps cues      Manual Therapy   Manual therapy comments  PROM foot    Joint Mobilization  ankle, drawer grade II/III with movement for DF    Passive ROM  right foot  able to get foot to 90  toes still flexable             PT Education - 06/01/18 1517    Education Details  exercise form,  how it will help    Person(s) Educated  Patient    Methods  Explanation;Demonstration;Tactile cues;Verbal cues    Comprehension  Returned demonstration;Verbalized understanding       PT Short Term Goals - 05/10/18 1157      PT SHORT TERM GOAL #1   Title  She will be independent with initial HEP     Time  4    Period  Weeks    Status  New      PT SHORT TERM GOAL #2   Title  Pt will be able to demo posture and body mechanics as it relates to lumbar spine  with bending and lifting     Time  4    Period  Weeks      PT SHORT TERM GOAL #3   Title  She will improve Berg score to demo safety with walking with SPC    Time  4    Period  Weeks    Status  New      PT SHORT TERM GOAL #4   Title  She will demo correctl flexion of RT leg with walking with SPC without LOB    Time  4    Period  Weeks    Status  New        PT Long Term Goals - 05/20/18 4008      PT LONG TERM GOAL #1   Title  She will be independent with all HEP issued    Time  12    Period  Weeks    Status  New  PT LONG TERM GOAL #2   Title  Pt will tolerate sitting for 1 hour without pain by 04/03/16    Time  12    Period  Weeks    Status  New      PT LONG TERM GOAL #4   Title  She will demo stairs with step over step pattern with one rail safely    Time  12    Period  Weeks    Status  New      PT LONG TERM GOAL #5   Title  FOTO score to improve to   50% limited to demo  improved function    Time  12    Period  Weeks    Status  New      PT LONG TERM GOAL #6   Title  She will walk in home without device and out of home with [facility] to improve community access    Time  12    Period  Weeks    Status  New      PT LONG TERM GOAL #7   Title  She will tolerate 90 min on feet for home and community activity    Time  12    Period  Weeks    Status  New            Plan - 06/01/18 1518    Clinical Impression Statement  Patient continues to work hard with strengthening and ROM.  Progress made toward STG#2 with beginning mini squat exercises. Strength right quad 3/5.  pain flared right quadratus lumborum with pelvic tilt.  Spasm eased with stretching.  No pain at end of session.  Emotional support given.  She will seek counseling for drprression with the encouragement of her MD.    PT Next Visit Plan  Strengthening , gait and balance.  continue to keep ankle flexible, recumbant .    PT Home Exercise Plan  transversus abdominus. standing weightbearing and standing left hip flexion    Consulted and Agree with Plan of Care  Patient       Patient will benefit from skilled therapeutic intervention in order to improve the following deficits and impairments:     Visit Diagnosis: Spinal stenosis at L4-L5 level  Chronic right-sided low back pain, with sciatica presence unspecified  Muscle weakness (generalized)  Difficulty in walking, not elsewhere classified  Stiffness of right ankle joint     Problem List Patient Active Problem List   Diagnosis Date Noted  . Hyperlipidemia 04/16/2018  . Generalized anxiety disorder with panic attacks 04/16/2018  . AKI (acute kidney injury) ([place])   . Benign essential HTN   . Depression with anxiety   . Lumbar radiculopathy 03/09/2018  . Paraparesis ([place]) 03/09/2018  . Essential hypertension   . Chronic nonintractable headache   . Leukocytosis   . Acute blood loss anemia   . Postoperative pain   . Generalized  weakness   . Spinal stenosis at L4-L5 level 03/04/2018  . Spinal stenosis of lumbar region 12/28/2015  . Chest pain 04/16/2015    Lenda Baratta PTA 06/01/2018, 3:29 PM  [facility] [address] [place], [name], [address] Phone: [phone]   Fax:  [phone]  Name: [name] MRN: [id number] Date of Birth: 1961/05/20

## 2018-06-03 ENCOUNTER — Ambulatory Visit: Payer: 59

## 2018-06-03 DIAGNOSIS — M48061 Spinal stenosis, lumbar region without neurogenic claudication: Secondary | ICD-10-CM

## 2018-06-03 DIAGNOSIS — M25671 Stiffness of right ankle, not elsewhere classified: Secondary | ICD-10-CM | POA: Diagnosis not present

## 2018-06-03 DIAGNOSIS — M6281 Muscle weakness (generalized): Secondary | ICD-10-CM

## 2018-06-03 DIAGNOSIS — G8929 Other chronic pain: Secondary | ICD-10-CM | POA: Diagnosis not present

## 2018-06-03 DIAGNOSIS — R262 Difficulty in walking, not elsewhere classified: Secondary | ICD-10-CM | POA: Diagnosis not present

## 2018-06-03 DIAGNOSIS — M545 Low back pain: Secondary | ICD-10-CM | POA: Diagnosis not present

## 2018-06-03 DIAGNOSIS — F418 Other specified anxiety disorders: Secondary | ICD-10-CM | POA: Diagnosis not present

## 2018-06-03 NOTE — Therapy (Signed)
[place], [name], [address] Phone: [phone]   Fax:  [phone]  Physical Therapy Treatment  Patient Details  Name: [name] MRN: [id number] Date of Birth: January 22, 1961 Referring Provider: [name], MD   Encounter Date: 06/03/2018  PT End of Session - 06/03/18 0856    Visit Number  8    Number of Visits  24    Date for PT Re-Evaluation  08/13/18    Authorization Type  [facility] [employer]    PT Start Time  [phone]    PT Stop Time  0930    PT Time Calculation (min)  38 min    Activity Tolerance  Patient tolerated treatment well    Behavior During Therapy  [facility] for tasks assessed/performed       Past Medical History:  Diagnosis Date  . Anxiety   . Back pain    related to spinal stenosis and disc problem, radiates down left buttocks to leg., weakness occ.  [name] Headache   . Hyperlipidemia   . Hypertension   . PONV (postoperative nausea and vomiting)   . Vaginal foreign object    "Uses Femring"    Past Surgical History:  Procedure Laterality Date  . ABDOMINAL HYSTERECTOMY    . CARDIAC CATHETERIZATION N/A 04/18/2015   Procedure: Left Heart Cath and Coronary Angiography;  Surgeon: [name], MD;  Location: [place] [facility];  Service: Cardiovascular;  Laterality: N/A;  . FOOT SURGERY Bilateral    [place] "bunion,bone spur, tendon" (1) -6'16, (1)-10'16  . LUMBAR LAMINECTOMY/DECOMPRESSION MICRODISCECTOMY Bilateral 12/28/2015   Procedure: MICRO LUMBAR DECOMPRESSION L4 - L5 BILATERALLY;  Surgeon: [name], MD;  [facility];  Service: Orthopedics;  Laterality: Bilateral;  . LUMBAR LAMINECTOMY/DECOMPRESSION MICRODISCECTOMY Bilateral 03/04/2018   Procedure: Revision of Microlumbar Decompression Bilateral Lumbar Four-Five;  Surgeon: [name], MD;  Location: [place];  Service: Orthopedics;  Laterality: Bilateral;  90 mins  . WISDOM TOOTH EXTRACTION    . WOUND EXPLORATION N/A 03/04/2018    Procedure: EXPLORATION OF LUMBAR DECOMPRESSION WOUND;  Surgeon: [name], MD;  Location: [place];  Service: Orthopedics;  Laterality: N/A;    There were no vitals filed for this visit.  Subjective Assessment - 06/03/18 0857    Subjective  Pain bad at MD yester day now 3/10 in back    Pain Score  3     Pain Location  Back    Pain Orientation  Right    Pain Descriptors / Indicators  Sore    Pain Onset  More than a month ago    Pain Frequency  Constant    Aggravating Factors   move wrong     Pain Relieving Factors  stretching                       [facility] Adult PT Treatment/Exercise - 06/03/18 0001      Knee/Hip Exercises: Aerobic   Nustep  L4 LE only x 5 min      Knee/Hip Exercises: Standing   Other Standing Knee Exercises  standing with marching x 10 RT/LT   then step forward /side /back with RT and LT leg both Rt arm support then  no support with stepping forward  and to side RT/ Lt and back 5-10x each, Tehn worked with CGA steppin forward with pausing to weight shift forward and side . walking forward   then walking with cuing to flex RT leg and  exagerate toe up with heel strike             PT Education - 06/03/18 0930    Education Details  need to practice lifting knee with swing to smooth gait and gain foot clearance    Person(s) Educated  Patient    Methods  Explanation;Demonstration;Verbal cues    Comprehension  Returned demonstration;Verbalized understanding       PT Short Term Goals - 06/03/18 0932      PT SHORT TERM GOAL #1   Title  She will be independent with initial HEP     Status  On-going      PT SHORT TERM GOAL #2   Title  Pt will be able to demo posture and body mechanics as it relates to lumbar spine  with bending and lifting     Status  Achieved      PT SHORT TERM GOAL #3   Title  She will improve Berg score to demo safety with walking with SPC    Status  Unable to assess      PT SHORT TERM GOAL #4   Title  She will demo  correctl flexion of RT leg with walking with SPC without LOB    Status  On-going               Plan - 06/03/18 0857    Clinical Impression Statement  She di well after working on gait pattern and incr flexion Rt leg with walking.   She is not ready to go without waler but pattern was better at the end. she is still nervous about falling     PT Treatment/Interventions  Manual techniques;Patient/family education;Therapeutic exercise;Therapeutic activities;Cryotherapy;Gait training;Stair training    PT Next Visit Plan  Strengthening , gait and balance.  continue to keep ankle flexible, recumbant .    PT Home Exercise Plan  transversus abdominus. standing weightbearing and standing left hip flexion    Consulted and Agree with Plan of Care  Patient       Patient will benefit from skilled therapeutic intervention in order to improve the following deficits and impairments:  Pain, Postural dysfunction, Difficulty walking, Decreased strength, Decreased activity tolerance  Visit Diagnosis: Spinal stenosis at L4-L5 level  Chronic right-sided low back pain, with sciatica presence unspecified  Muscle weakness (generalized)  Difficulty in walking, not elsewhere classified     Problem List Patient Active Problem List   Diagnosis Date Noted  . Hyperlipidemia 04/16/2018  . Generalized anxiety disorder with panic attacks 04/16/2018  . AKI (acute kidney injury) ([place])   . Benign essential HTN   . Depression with anxiety   . Lumbar radiculopathy 03/09/2018  . Paraparesis ([place]) 03/09/2018  . Essential hypertension   . Chronic nonintractable headache   . Leukocytosis   . Acute blood loss anemia   . Postoperative pain   . Generalized weakness   . Spinal stenosis at L4-L5 level 03/04/2018  . Spinal stenosis of lumbar region 12/28/2015  . Chest pain 04/16/2015    Darrel Hoover, PT 06/03/2018, 9:33 AM  [facility] [address] [place], [name], [address] Phone: [phone]   Fax:  [phone]  Name: [name] MRN: [id number] Date of Birth: October 08, 1961

## 2018-06-08 ENCOUNTER — Ambulatory Visit: Payer: 59

## 2018-06-08 DIAGNOSIS — M48061 Spinal stenosis, lumbar region without neurogenic claudication: Secondary | ICD-10-CM

## 2018-06-08 DIAGNOSIS — R262 Difficulty in walking, not elsewhere classified: Secondary | ICD-10-CM

## 2018-06-08 DIAGNOSIS — M545 Low back pain: Secondary | ICD-10-CM

## 2018-06-08 DIAGNOSIS — F418 Other specified anxiety disorders: Secondary | ICD-10-CM | POA: Diagnosis not present

## 2018-06-08 DIAGNOSIS — M6281 Muscle weakness (generalized): Secondary | ICD-10-CM

## 2018-06-08 DIAGNOSIS — M25671 Stiffness of right ankle, not elsewhere classified: Secondary | ICD-10-CM | POA: Diagnosis not present

## 2018-06-08 DIAGNOSIS — G8929 Other chronic pain: Secondary | ICD-10-CM

## 2018-06-08 NOTE — Therapy (Signed)
[place], [name], [address] Phone: [phone]   Fax:  [phone]  Physical Therapy Treatment  Patient Details  Name: [name] MRN: [id number] Date of Birth: 07-20-1961 Referring Provider: [name], MD   Encounter Date: 06/08/2018  PT End of Session - 06/08/18 0858    Visit Number  9    Number of Visits  24    Date for PT Re-Evaluation  08/13/18    Authorization Type  [facility]    PT Start Time  [phone]    PT Stop Time  0930    PT Time Calculation (min)  38 min    Activity Tolerance  Patient tolerated treatment well    Behavior During Therapy  [facility] for tasks assessed/performed       Past Medical History:  Diagnosis Date  . Anxiety   . Back pain    related to spinal stenosis and disc problem, radiates down left buttocks to leg., weakness occ.  [name] Headache   . Hyperlipidemia   . Hypertension   . PONV (postoperative nausea and vomiting)   . Vaginal foreign object    "Uses Femring"    Past Surgical History:  Procedure Laterality Date  . ABDOMINAL HYSTERECTOMY    . CARDIAC CATHETERIZATION N/A 04/18/2015   Procedure: Left Heart Cath and Coronary Angiography;  Surgeon: [name], MD;  Location: [place] [facility];  Service: Cardiovascular;  Laterality: N/A;  . FOOT SURGERY Bilateral    [place] "bunion,bone spur, tendon" (1) -6'16, (1)-10'16  . LUMBAR LAMINECTOMY/DECOMPRESSION MICRODISCECTOMY Bilateral 12/28/2015   Procedure: MICRO LUMBAR DECOMPRESSION L4 - L5 BILATERALLY;  Surgeon: [name], MD;  [facility];  Service: Orthopedics;  Laterality: Bilateral;  . LUMBAR LAMINECTOMY/DECOMPRESSION MICRODISCECTOMY Bilateral 03/04/2018   Procedure: Revision of Microlumbar Decompression Bilateral Lumbar Four-Five;  Surgeon: [name], MD;  Location: [place];  Service: Orthopedics;  Laterality: Bilateral;  90 mins  . WISDOM TOOTH EXTRACTION    . WOUND EXPLORATION N/A  03/04/2018   Procedure: EXPLORATION OF LUMBAR DECOMPRESSION WOUND;  Surgeon: [name], MD;  Location: [place];  Service: Orthopedics;  Laterality: N/A;    There were no vitals filed for this visit.  Subjective Assessment - 06/08/18 0859    Subjective  3/10 pain today . Feeling pretty good.   Did sitting exercise     Pertinent History  previous back decompression    Pain Score  3     Pain Location  Back    Pain Orientation  Right    Pain Descriptors / Indicators  Sore    Pain Type  Surgical pain    Pain Onset  More than a month ago    Pain Frequency  Constant    Aggravating Factors   moving wrong    Pain Relieving Factors  stretch                       [facility] Adult PT Treatment/Exercise - 06/08/18 0001      Lumbar Exercises: Supine   Pelvic Tilt  10 reps;5 seconds      Knee/Hip Exercises: Aerobic   Nustep  L4 LE only x 5 min      Knee/Hip Exercises: Standing   Functional Squat  20 reps   cuing for technique , hip flexion     Knee/Hip Exercises: Seated   Long Arc Quad  Right;Left;15 reps   target offered for full extension  Knee/Hip Exercises: Supine   Bridges  15 reps    Other Supine Knee/Hip Exercises  bilateral clam x 15 , assisted knee flexion RT x 15, green band clams x 15, then  knee ext with thigh at 45 degrees  x 15               PT Short Term Goals - 06/03/18 0932      PT SHORT TERM GOAL #1   Title  She will be independent with initial HEP     Status  On-going      PT SHORT TERM GOAL #2   Title  Pt will be able to demo posture and body mechanics as it relates to lumbar spine  with bending and lifting     Status  Achieved      PT SHORT TERM GOAL #3   Title  She will improve Berg score to demo safety with walking with SPC    Status  Unable to assess      PT SHORT TERM GOAL #4   Title  She will demo correctl flexion of RT leg with walking with SPC without LOB    Status  On-going        PT Long Term Goals - 05/20/18 0856       PT LONG TERM GOAL #1   Title  She will be independent with all HEP issued    Time  12    Period  Weeks    Status  New      PT LONG TERM GOAL #2   Title  Pt will tolerate sitting for 1 hour without pain by 04/03/16    Time  12    Period  Weeks    Status  New      PT LONG TERM GOAL #4   Title  She will demo stairs with step over step pattern with one rail safely    Time  12    Period  Weeks    Status  New      PT LONG TERM GOAL #5   Title  FOTO score to improve to   50% limited to demo improved function    Time  12    Period  Weeks    Status  New      PT LONG TERM GOAL #6   Title  She will walk in home without device and out of home with [facility] to improve community access    Time  12    Period  Weeks    Status  New      PT LONG TERM GOAL #7   Title  She will tolerate 90 min on feet for home and community activity    Time  12    Period  Weeks    Status  New            Plan - 06/08/18 1610    Clinical Impression Statement  Feel pretty good . did exercises las t night. Pain 3/10. She is improving with strength and control. still needs assist for hip flexion for full ROm and is cued to work on control/smoothness with movement.     PT Treatment/Interventions  Manual techniques;Patient/family education;Therapeutic exercise;Therapeutic activities;Cryotherapy;Gait training;Stair training    PT Next Visit Plan  Strengthening , gait and balance.  continue to keep ankle flexible, recumbant .    PT Home Exercise Plan  transversus abdominus. standing weightbearing and standing left hip flexion    Consulted and  Agree with Plan of Care  Patient       Patient will benefit from skilled therapeutic intervention in order to improve the following deficits and impairments:  Pain, Postural dysfunction, Difficulty walking, Decreased strength, Decreased activity tolerance  Visit Diagnosis: Spinal stenosis at L4-L5 level  Chronic right-sided low back pain, with sciatica presence  unspecified  Muscle weakness (generalized)  Difficulty in walking, not elsewhere classified     Problem List Patient Active Problem List   Diagnosis Date Noted  . Hyperlipidemia 04/16/2018  . Generalized anxiety disorder with panic attacks 04/16/2018  . AKI (acute kidney injury) ([place])   . Benign essential HTN   . Depression with anxiety   . Lumbar radiculopathy 03/09/2018  . Paraparesis ([place]) 03/09/2018  . Essential hypertension   . Chronic nonintractable headache   . Leukocytosis   . Acute blood loss anemia   . Postoperative pain   . Generalized weakness   . Spinal stenosis at L4-L5 level 03/04/2018  . Spinal stenosis of lumbar region 12/28/2015  . Chest pain 04/16/2015    Darrel Hoover PT 06/08/2018, 9:31 AM  [facility] [address] [place], [name], [address] Phone: [phone]   Fax:  [phone]  Name: [name] MRN: [id number] Date of Birth: April 16, 1961

## 2018-06-10 ENCOUNTER — Ambulatory Visit: Payer: 59

## 2018-06-10 DIAGNOSIS — M48061 Spinal stenosis, lumbar region without neurogenic claudication: Secondary | ICD-10-CM

## 2018-06-10 DIAGNOSIS — G8929 Other chronic pain: Secondary | ICD-10-CM | POA: Diagnosis not present

## 2018-06-10 DIAGNOSIS — M545 Low back pain: Secondary | ICD-10-CM

## 2018-06-10 DIAGNOSIS — M6281 Muscle weakness (generalized): Secondary | ICD-10-CM

## 2018-06-10 DIAGNOSIS — R262 Difficulty in walking, not elsewhere classified: Secondary | ICD-10-CM | POA: Diagnosis not present

## 2018-06-10 DIAGNOSIS — M25671 Stiffness of right ankle, not elsewhere classified: Secondary | ICD-10-CM | POA: Diagnosis not present

## 2018-06-10 DIAGNOSIS — F418 Other specified anxiety disorders: Secondary | ICD-10-CM | POA: Diagnosis not present

## 2018-06-10 NOTE — Therapy (Signed)
[place], [name], [address] Phone: [phone]   Fax:  [phone]  Physical Therapy Treatment  Patient Details  Name: [name] MRN: [id number] Date of Birth: 02-Nov-1960 Referring Provider: [name], MD   Encounter Date: 06/10/2018  PT End of Session - 06/10/18 0848    Visit Number  10    Number of Visits  24    Date for PT Re-Evaluation  08/13/18    Authorization Type  [facility]    PT Start Time  0845    PT Stop Time  0930    PT Time Calculation (min)  45 min    Activity Tolerance  Patient tolerated treatment well    Behavior During Therapy  [facility] for tasks assessed/performed       Past Medical History:  Diagnosis Date  . Anxiety   . Back pain    related to spinal stenosis and disc problem, radiates down left buttocks to leg., weakness occ.  [name] Headache   . Hyperlipidemia   . Hypertension   . PONV (postoperative nausea and vomiting)   . Vaginal foreign object    "Uses Femring"    Past Surgical History:  Procedure Laterality Date  . ABDOMINAL HYSTERECTOMY    . CARDIAC CATHETERIZATION N/A 04/18/2015   Procedure: Left Heart Cath and Coronary Angiography;  Surgeon: [name], MD;  Location: [place] [facility];  Service: Cardiovascular;  Laterality: N/A;  . FOOT SURGERY Bilateral    [place] "bunion,bone spur, tendon" (1) -6'16, (1)-10'16  . LUMBAR LAMINECTOMY/DECOMPRESSION MICRODISCECTOMY Bilateral 12/28/2015   Procedure: MICRO LUMBAR DECOMPRESSION L4 - L5 BILATERALLY;  Surgeon: [name], MD;  [facility];  Service: Orthopedics;  Laterality: Bilateral;  . LUMBAR LAMINECTOMY/DECOMPRESSION MICRODISCECTOMY Bilateral 03/04/2018   Procedure: Revision of Microlumbar Decompression Bilateral Lumbar Four-Five;  Surgeon: [name], MD;  Location: [place];  Service: Orthopedics;  Laterality: Bilateral;  90 mins  . WISDOM TOOTH EXTRACTION    . WOUND EXPLORATION N/A  03/04/2018   Procedure: EXPLORATION OF LUMBAR DECOMPRESSION WOUND;  Surgeon: [name], MD;  Location: [place];  Service: Orthopedics;  Laterality: N/A;    There were no vitals filed for this visit.  Subjective Assessment - 06/10/18 0849    Subjective  3/10 today.   Otherwise Ok    Pain Score  3     Pain Location  Back    Pain Orientation  Right    Pain Descriptors / Indicators  Sore    Pain Type  Surgical pain    Pain Onset  More than a month ago    Pain Frequency  Constant         [facility] PT Assessment - 06/10/18 0001      Observation/Other Assessments   Focus on Therapeutic Outcomes (FOTO)   60% limited 4% decrease                   [facility] Adult PT Treatment/Exercise - 06/10/18 0001      Ambulation/Gait   Ambulation/Gait  Yes    Ambulation/Gait Assistance  4: Min assist    Ambulation/Gait Assistance Details  CGA for duration with belt    Assistive device  None    Gait Pattern  Step-through pattern    Ambulation Surface  Level;Indoor    Stairs  Yes    Stairs Assistance  4: Min guard    Stair Management Technique  Two rails;Alternating pattern    Number of  Stairs  12    Height of Stairs  6    Gait Comments  We worked on smooth gait pattern with swing through with exaggeration of RT leg swing with flexion and swing arms.  Worked on walking forward and back and side steppping and stepping on to 4 inch step and pover 4x4 for balance and on non compliant surfaces .       Knee/Hip Exercises: Aerobic   Nustep  L4 LE only x 5 min      Manual Therapy   Passive ROM  Toe and ankle DF       Ankle Exercises: Seated   Other Seated Ankle Exercises  worked on active and active assisted DF with cues to watch and feel the contraction and to do as able in shoe at work and home to facilitate strength             PT Education - 06/10/18 0932    Education Details  Using senses to facilitate DF  sttrength and to incr time spent with DF exercise    Person(s) Educated   Patient    Methods  Explanation;Tactile cues;Verbal cues    Comprehension  Returned demonstration;Verbalized understanding       PT Short Term Goals - 06/03/18 0932      PT SHORT TERM GOAL #1   Title  She will be independent with initial HEP     Status  On-going      PT SHORT TERM GOAL #2   Title  Pt will be able to demo posture and body mechanics as it relates to lumbar spine  with bending and lifting     Status  Achieved      PT SHORT TERM GOAL #3   Title  She will improve Berg score to demo safety with walking with SPC    Status  Unable to assess      PT SHORT TERM GOAL #4   Title  She will demo correctl flexion of RT leg with walking with SPC without LOB    Status  On-going        PT Long Term Goals - 05/20/18 0856      PT LONG TERM GOAL #1   Title  She will be independent with all HEP issued    Time  12    Period  Weeks    Status  New      PT LONG TERM GOAL #2   Title  Pt will tolerate sitting for 1 hour without pain by 04/03/16    Time  12    Period  Weeks    Status  New      PT LONG TERM GOAL #4   Title  She will demo stairs with step over step pattern with one rail safely    Time  12    Period  Weeks    Status  New      PT LONG TERM GOAL #5   Title  FOTO score to improve to   50% limited to demo improved function    Time  12    Period  Weeks    Status  New      PT LONG TERM GOAL #6   Title  She will walk in home without device and out of home with [facility] to improve community access    Time  12    Period  Weeks    Status  New      PT  LONG TERM GOAL #7   Title  She will tolerate 90 min on feet for home and community activity    Time  12    Period  Weeks    Status  New            Plan - 06/10/18 0848    Clinical Impression Statement  She was able to improve smoothness with gait and decr unsteadiness (still needs walker for safety) and was able to hold DF > 90 degrees with knee flexed  so potential to activly improve strenth is good.       PT  Treatment/Interventions  Manual techniques;Patient/family education;Therapeutic exercise;Therapeutic activities;Cryotherapy;Gait training;Stair training    PT Next Visit Plan  Strengthening , gait and balance.  continue to keep ankle flexible, recumbant .    PT Home Exercise Plan  transversus abdominus. standing weightbearing and standing left hip flexion    Consulted and Agree with Plan of Care  Patient       Patient will benefit from skilled therapeutic intervention in order to improve the following deficits and impairments:  Pain, Postural dysfunction, Difficulty walking, Decreased strength, Decreased activity tolerance  Visit Diagnosis: Spinal stenosis at L4-L5 level  Chronic right-sided low back pain, with sciatica presence unspecified  Muscle weakness (generalized)  Difficulty in walking, not elsewhere classified     Problem List Patient Active Problem List   Diagnosis Date Noted  . Hyperlipidemia 04/16/2018  . Generalized anxiety disorder with panic attacks 04/16/2018  . AKI (acute kidney injury) ([place])   . Benign essential HTN   . Depression with anxiety   . Lumbar radiculopathy 03/09/2018  . Paraparesis ([place]) 03/09/2018  . Essential hypertension   . Chronic nonintractable headache   . Leukocytosis   . Acute blood loss anemia   . Postoperative pain   . Generalized weakness   . Spinal stenosis at L4-L5 level 03/04/2018  . Spinal stenosis of lumbar region 12/28/2015  . Chest pain 04/16/2015    Darrel Hoover  PT 06/10/2018, 9:36 AM  [facility] [address] [place], [name], [address] Phone: [phone]   Fax:  [phone]  Name: [name] MRN: [id number] Date of Birth: 26-Nov-1960

## 2018-06-15 ENCOUNTER — Encounter: Payer: Self-pay | Admitting: Physical Therapy

## 2018-06-15 ENCOUNTER — Ambulatory Visit: Payer: 59 | Admitting: Physical Therapy

## 2018-06-15 DIAGNOSIS — R262 Difficulty in walking, not elsewhere classified: Secondary | ICD-10-CM

## 2018-06-15 DIAGNOSIS — M6281 Muscle weakness (generalized): Secondary | ICD-10-CM | POA: Diagnosis not present

## 2018-06-15 DIAGNOSIS — M25671 Stiffness of right ankle, not elsewhere classified: Secondary | ICD-10-CM

## 2018-06-15 DIAGNOSIS — M48061 Spinal stenosis, lumbar region without neurogenic claudication: Secondary | ICD-10-CM

## 2018-06-15 DIAGNOSIS — G8929 Other chronic pain: Secondary | ICD-10-CM | POA: Diagnosis not present

## 2018-06-15 DIAGNOSIS — M545 Low back pain: Secondary | ICD-10-CM | POA: Diagnosis not present

## 2018-06-15 DIAGNOSIS — F418 Other specified anxiety disorders: Secondary | ICD-10-CM

## 2018-06-15 NOTE — Therapy (Signed)
[place], [name], [address] Phone: [phone]   Fax:  [phone]  Physical Therapy Treatment  Patient Details  Name: [name] MRN: [id number] Date of Birth: 08/15/1961 Referring Provider: [name], MD   Encounter Date: 06/15/2018  PT End of Session - 06/15/18 1519    Visit Number  11    Number of Visits  24    Date for PT Re-Evaluation  08/13/18    Authorization Type  [facility]    PT Start Time  0845    PT Stop Time  0929    PT Time Calculation (min)  44 min    Equipment Utilized During Treatment  Gait belt       Past Medical History:  Diagnosis Date  . Anxiety   . Back pain    related to spinal stenosis and disc problem, radiates down left buttocks to leg., weakness occ.  [name] Headache   . Hyperlipidemia   . Hypertension   . PONV (postoperative nausea and vomiting)   . Vaginal foreign object    "Uses Femring"    Past Surgical History:  Procedure Laterality Date  . ABDOMINAL HYSTERECTOMY    . CARDIAC CATHETERIZATION N/A 04/18/2015   Procedure: Left Heart Cath and Coronary Angiography;  Surgeon: [name], MD;  Location: [facility];  Service: Cardiovascular;  Laterality: N/A;  . FOOT SURGERY Bilateral    [place] "bunion,bone spur, tendon" (1) -6'16, (1)-10'16  . LUMBAR LAMINECTOMY/DECOMPRESSION MICRODISCECTOMY Bilateral 12/28/2015   Procedure: MICRO LUMBAR DECOMPRESSION L4 - L5 BILATERALLY;  Surgeon: [name], MD;  [facility];  Service: Orthopedics;  Laterality: Bilateral;  . LUMBAR LAMINECTOMY/DECOMPRESSION MICRODISCECTOMY Bilateral 03/04/2018   Procedure: Revision of Microlumbar Decompression Bilateral Lumbar Four-Five;  Surgeon: [name], MD;  Location: [facility];  Service: Orthopedics;  Laterality: Bilateral;  90 mins  . WISDOM TOOTH EXTRACTION    . WOUND EXPLORATION N/A 03/04/2018   Procedure: EXPLORATION OF LUMBAR DECOMPRESSION WOUND;   Surgeon: [name], MD;  Location: [facility];  Service: Orthopedics;  Laterality: N/A;    There were no vitals filed for this visit.  Subjective Assessment - 06/15/18 0854    Subjective  3/10 today.  didnt do much this weekend. I was at hospital sitting in chairs with my brother in ICU on Fridayl  Rested Saturdays. I was at church getting up and down at church.    (Pended)     Pertinent History  previous back decompression  (Pended)     Limitations  Walking;House hold activities;Sitting  (Pended)     How long can you sit comfortably?  15 minutes and gets stiff  (Pended)     Patient Stated Goals  She wants to walk normal and drive and return to normal home tasks.   (Pended)     Pain Score  3   (Pended)     Pain Location  Back  (Pended)     Pain Orientation  Right  (Pended)     Pain Descriptors / Indicators  Sore  (Pended)     Pain Onset  More than a month ago  (Pended)     Pain Frequency  Constant  (Pended)                        [facility] Adult PT Treatment/Exercise - 06/15/18 6301      Ambulation/Gait   Ambulation/Gait  Yes    Ambulation/Gait Assistance  4: Min assist    Assistive device  None    Gait Pattern  Step-through pattern    Stairs  Yes    Stairs Assistance  4: Min guard    Stair Management Technique  Two rails;Alternating pattern    Number of Stairs  8    Height of Stairs  6    Gait Comments  worked on strengthening and glut activation on steps . pt with walking tall at mid stande and glut activation with braced side from mid stance to push off continued gait training with exageration of RT leg swing as learned last session  .       Knee/Hip Exercises: Standing   Forward Step Up  10 reps;Hand Hold: 2;2 sets    Functional Squat  20 reps   cuing for technique , hip flexion   Other Standing Knee Exercises  lateral step up on right , sink mini squat at tolerated in brace x 10     Other Standing Knee Exercises  standing SLR flex , abd and ext x 10 on right and  left holding onto counter   emphasis on keeping right foot DF with hip flex at counter     Knee/Hip Exercises: Seated   Long Arc Quad  Right;Strengthening;10 reps;2 sets    Sit to [employer]  10 reps;2 sets;with UE support   at counter     Manual Therapy   Manual Therapy  Joint mobilization;Passive ROM    Joint Mobilization  Great toe flex/ext with distraction and grade 3    Passive ROM  Toe and ankle DF    use of towel to assist            PT Education - 06/15/18 0916    Education Details  Gait trainging on steps and level surfaces.  step up exericses with assistance CGA x 1    Person(s) Educated  Patient    Methods  Explanation;Demonstration;Tactile cues;Verbal cues;Handout    Comprehension  Verbalized understanding;Returned demonstration       PT Short Term Goals - 06/03/18 0932      PT SHORT TERM GOAL #1   Title  She will be independent with initial HEP     Status  On-going      PT SHORT TERM GOAL #2   Title  Pt will be able to demo posture and body mechanics as it relates to lumbar spine  with bending and lifting     Status  Achieved      PT SHORT TERM GOAL #3   Title  She will improve Berg score to demo safety with walking with SPC    Status  Unable to assess      PT SHORT TERM GOAL #4   Title  She will demo correctl flexion of RT leg with walking with SPC without LOB    Status  On-going        PT Long Term Goals - 05/20/18 0856      PT LONG TERM GOAL #1   Title  She will be independent with all HEP issued    Time  12    Period  Weeks    Status  New      PT LONG TERM GOAL #2   Title  Pt will tolerate sitting for 1 hour without pain by 04/03/16    Time  12    Period  Weeks    Status  New      PT LONG TERM  GOAL #4   Title  She will demo stairs with step over step pattern with one rail safely    Time  12    Period  Weeks    Status  New      PT LONG TERM GOAL #5   Title  FOTO score to improve to   50% limited to demo improved function    Time  12     Period  Weeks    Status  New      PT LONG TERM GOAL #6   Title  She will walk in home without device and out of home with [facility] to improve community access    Time  12    Period  Weeks    Status  New      PT LONG TERM GOAL #7   Title  She will tolerate 90 min on feet for home and community activity    Time  12    Period  Weeks    Status  New            Plan - 06/15/18 9767    Clinical Impression Statement  Pt was able to improve gait and decreased unsteadiness on level and steps. Pt able to perform Sit to stand with CGA x 1.  Pt needs continuing gait trainiig and reinforcment due to weakness on right LE.  Will continue to perform strengthening for increased stability but now needs to use assistive device for safety and fall prevention    Rehab Potential  Good    PT Frequency  2x / week    PT Duration  12 weeks    PT Treatment/Interventions  Manual techniques;Patient/family education;Therapeutic exercise;Therapeutic activities;Cryotherapy;Gait training;Stair training    PT Next Visit Plan  Strengthening , gait and balance standing and steps.  continue to keep ankle flexible, recumbant .    PT Home Exercise Plan  transversus abdominus. standing weightbearing and standing left hip flexion, knee step ups with assistance with glut activation    Consulted and Agree with Plan of Care  Patient       Patient will benefit from skilled therapeutic intervention in order to improve the following deficits and impairments:  Pain, Postural dysfunction, Difficulty walking, Decreased strength, Decreased activity tolerance  Visit Diagnosis: Spinal stenosis at L4-L5 level  Chronic right-sided low back pain, with sciatica presence unspecified  Muscle weakness (generalized)  Difficulty in walking, not elsewhere classified  Stiffness of right ankle joint  Depression with anxiety     Problem List Patient Active Problem List   Diagnosis Date Noted  . Hyperlipidemia 04/16/2018  .  Generalized anxiety disorder with panic attacks 04/16/2018  . AKI (acute kidney injury) ([place])   . Benign essential HTN   . Depression with anxiety   . Lumbar radiculopathy 03/09/2018  . Paraparesis ([place]) 03/09/2018  . Essential hypertension   . Chronic nonintractable headache   . Leukocytosis   . Acute blood loss anemia   . Postoperative pain   . Generalized weakness   . Spinal stenosis at L4-L5 level 03/04/2018  . Spinal stenosis of lumbar region 12/28/2015  . Chest pain 04/16/2015   Voncille Lo, PT Certified Exercise Expert for the Aging Adult  06/15/18 5:24 PM Phone: [phone] Fax: [facility] [address] [place], [name], [address] Phone: [phone]   Fax:  [phone]  Name: [name] MRN: [id number] Date of Birth: [birth year]

## 2018-06-15 NOTE — Patient Instructions (Signed)
Step Down: Anterior For STEP exercises, please DO NOT DO ALONE. HAVE SOMEONE WITH YOU TO DO EXERCISES FOR SAFETY  Copyright  VHI. All rights reserved.  Step-Up: Lateral    hold onto step rail with this exericse Step up to side with right leg. Bring other foot up onto ___6 _ inch step. Return to floor position with left leg. Repeat __10__ times per session. Do _2___ sessions per day. Repeat in dimly lit room. Repeat with eyes closed.  Copyright  VHI. All rights reserved.  Forward   Facing step, place one leg on step, flexed at hip. Step up slowly, bringing hips in line with knee and shoulder. Bring other foot onto step. Reverse process to step back down. Repeat with other leg. Do __10__ repetitions, _2 times a day ___   http://bt.exer.us/154   Copyright  VHI. All rights reserved.   Voncille Lo, PT Certified Exercise Expert for the Aging Adult  06/15/18 9:16 AM Phone: [phone] Fax: [phone]

## 2018-06-17 ENCOUNTER — Ambulatory Visit: Payer: 59

## 2018-06-17 DIAGNOSIS — M545 Low back pain, unspecified: Secondary | ICD-10-CM

## 2018-06-17 DIAGNOSIS — R262 Difficulty in walking, not elsewhere classified: Secondary | ICD-10-CM

## 2018-06-17 DIAGNOSIS — G8929 Other chronic pain: Secondary | ICD-10-CM | POA: Diagnosis not present

## 2018-06-17 DIAGNOSIS — M48061 Spinal stenosis, lumbar region without neurogenic claudication: Secondary | ICD-10-CM

## 2018-06-17 DIAGNOSIS — M6281 Muscle weakness (generalized): Secondary | ICD-10-CM | POA: Diagnosis not present

## 2018-06-17 DIAGNOSIS — M25671 Stiffness of right ankle, not elsewhere classified: Secondary | ICD-10-CM | POA: Diagnosis not present

## 2018-06-17 DIAGNOSIS — F418 Other specified anxiety disorders: Secondary | ICD-10-CM | POA: Diagnosis not present

## 2018-06-17 NOTE — Patient Instructions (Signed)
Sitting Lt side bend stretch 10-30 sec x 2-3 3x/day

## 2018-06-17 NOTE — Therapy (Signed)
[place], [name], [address] Phone: [phone]   Fax:  [phone]  Physical Therapy Treatment  Patient Details  Name: [name] MRN: [id number] Date of Birth: [birth year] Referring Provider: [name], MD   Encounter Date: 06/17/2018  PT End of Session - 06/17/18 0937    Visit Number  12    Number of Visits  24    Date for PT Re-Evaluation  08/13/18    Authorization Type  [facility]    PT Start Time  0850    PT Stop Time  0945    PT Time Calculation (min)  55 min    Activity Tolerance  Patient tolerated treatment well    Behavior During Therapy  [facility] for tasks assessed/performed       Past Medical History:  Diagnosis Date  . Anxiety   . Back pain    related to spinal stenosis and disc problem, radiates down left buttocks to leg., weakness occ.  [name] Headache   . Hyperlipidemia   . Hypertension   . PONV (postoperative nausea and vomiting)   . Vaginal foreign object    "Uses Femring"    Past Surgical History:  Procedure Laterality Date  . ABDOMINAL HYSTERECTOMY    . CARDIAC CATHETERIZATION N/A 04/18/2015   Procedure: Left Heart Cath and Coronary Angiography;  Surgeon: [name], MD;  Location: [facility];  Service: Cardiovascular;  Laterality: N/A;  . FOOT SURGERY Bilateral    [place] "bunion,bone spur, tendon" (1) -6'16, (1)-10'16  . LUMBAR LAMINECTOMY/DECOMPRESSION MICRODISCECTOMY Bilateral 12/28/2015   Procedure: MICRO LUMBAR DECOMPRESSION L4 - L5 BILATERALLY;  Surgeon: [name], MD;  [facility];  Service: Orthopedics;  Laterality: Bilateral;  . LUMBAR LAMINECTOMY/DECOMPRESSION MICRODISCECTOMY Bilateral 03/04/2018   Procedure: Revision of Microlumbar Decompression Bilateral Lumbar Four-Five;  Surgeon: [name], MD;  Location: [facility];  Service: Orthopedics;  Laterality: Bilateral;  90 mins  . WISDOM TOOTH EXTRACTION    . WOUND EXPLORATION N/A  03/04/2018   Procedure: EXPLORATION OF LUMBAR DECOMPRESSION WOUND;  Surgeon: [name], MD;  Location: [facility];  Service: Orthopedics;  Laterality: N/A;    There were no vitals filed for this visit.  Subjective Assessment - 06/17/18 0853    Subjective  Back sore i think from all work this week.     Pain Score  5     Pain Location  Back    Pain Orientation  Right    Pain Descriptors / Indicators  Sore    Pain Type  Surgical pain    Pain Frequency  Constant    Pain Relieving Factors  stretch                       [facility] Adult PT Treatment/Exercise - 06/17/18 0001      Lumbar Exercises: Stretches   Other Lumbar Stretch Exercise  quadratus lumborum stretch sitting with gentle rottion       Knee/Hip Exercises: Standing   Other Standing Knee Exercises  marching RT and LT 10 reps      Knee/Hip Exercises: Seated   Long Arc Quad  Right;Strengthening;10 reps;2 sets    Sit to [employer]  2 sets;10 reps   from elevated mat     Modalities   Modalities  Ultrasound      Moist Heat Therapy   Number Minutes Moist Heat  10 Minutes    Moist Heat Location  Lumbar Spine  Ultrasound   Ultrasound Location  RT lower back    Ultrasound Parameters  100% 1MHz 1.5 Wcm2      Manual Therapy   Manual Therapy  Soft tissue mobilization;Taping    Soft tissue mobilization  RT loweth toolr back      Kinesiotex  Inhibit Muscle      Kinesiotix   Inhibit Muscle   across area of pain with 50% pulls and one long strep ASIS to mid back RT side.              PT Education - 06/17/18 0941    Education Details  side bend stretch    Person(s) Educated  Patient    Methods  Explanation;Demonstration;Tactile cues;Verbal cues    Comprehension  Verbalized understanding;Returned demonstration       PT Short Term Goals - 06/03/18 0932      PT SHORT TERM GOAL #1   Title  She will be independent with initial HEP     Status  On-going      PT SHORT TERM GOAL #2   Title  Pt will be able to  demo posture and body mechanics as it relates to lumbar spine  with bending and lifting     Status  Achieved      PT SHORT TERM GOAL #3   Title  She will improve Berg score to demo safety with walking with SPC    Status  Unable to assess      PT SHORT TERM GOAL #4   Title  She will demo correctl flexion of RT leg with walking with SPC without LOB    Status  On-going        PT Long Term Goals - 05/20/18 0856      PT LONG TERM GOAL #1   Title  She will be independent with all HEP issued    Time  12    Period  Weeks    Status  New      PT LONG TERM GOAL #2   Title  Pt will tolerate sitting for 1 hour without pain by 04/03/16    Time  12    Period  Weeks    Status  New      PT LONG TERM GOAL #4   Title  She will demo stairs with step over step pattern with one rail safely    Time  12    Period  Weeks    Status  New      PT LONG TERM GOAL #5   Title  FOTO score to improve to   50% limited to demo improved function    Time  12    Period  Weeks    Status  New      PT LONG TERM GOAL #6   Title  She will walk in home without device and out of home with [facility] to improve community access    Time  12    Period  Weeks    Status  New      PT LONG TERM GOAL #7   Title  She will tolerate 90 min on feet for home and community activity    Time  12    Period  Weeks    Status  New            Plan - 06/17/18 7793    Clinical Impression Statement  Contniued strength but also some attention to RT LBP . PAin  decreased post session.    LT leg unstable at knee with LT single leg stand. Cued to knee knee flexed. Slightly.     PT Treatment/Interventions  Manual techniques;Patient/family education;Therapeutic exercise;Therapeutic activities;Cryotherapy;Gait training;Stair training    PT Next Visit Plan  Strengthening , gait and balance standing and steps.  continue to keep ankle flexible, recumbant .  Some attention to RT LBP as needed    PT Home Exercise Plan  transversus abdominus.  standing weightbearing and standing left hip flexion, knee step ups with assistance with glut activation, quadratus stretch       Patient will benefit from skilled therapeutic intervention in order to improve the following deficits and impairments:  Pain, Postural dysfunction, Difficulty walking, Decreased strength, Decreased activity tolerance  Visit Diagnosis: Spinal stenosis at L4-L5 level  Chronic right-sided low back pain, with sciatica presence unspecified  Muscle weakness (generalized)  Difficulty in walking, not elsewhere classified  Acute right-sided low back pain without sciatica     Problem List Patient Active Problem List   Diagnosis Date Noted  . Hyperlipidemia 04/16/2018  . Generalized anxiety disorder with panic attacks 04/16/2018  . AKI (acute kidney injury) ([place])   . Benign essential HTN   . Depression with anxiety   . Lumbar radiculopathy 03/09/2018  . Paraparesis ([place]) 03/09/2018  . Essential hypertension   . Chronic nonintractable headache   . Leukocytosis   . Acute blood loss anemia   . Postoperative pain   . Generalized weakness   . Spinal stenosis at L4-L5 level 03/04/2018  . Spinal stenosis of lumbar region 12/28/2015  . Chest pain 04/16/2015    Darrel Hoover  PT 06/17/2018, 9:45 AM  [facility] [address] [place], [name], [address] Phone: [phone]   Fax:  [phone]  Name: [name] MRN: [id number] Date of Birth: [birth year]

## 2018-06-18 DIAGNOSIS — Z01419 Encounter for gynecological examination (general) (routine) without abnormal findings: Secondary | ICD-10-CM | POA: Diagnosis not present

## 2018-06-18 MED FILL — PREMARIN 0.625 MG TABLET: 0.625 | 30 days supply | Qty: 30 | Fill #4

## 2018-06-19 DIAGNOSIS — R269 Unspecified abnormalities of gait and mobility: Secondary | ICD-10-CM | POA: Diagnosis not present

## 2018-06-19 DIAGNOSIS — M48061 Spinal stenosis, lumbar region without neurogenic claudication: Secondary | ICD-10-CM | POA: Diagnosis not present

## 2018-06-22 ENCOUNTER — Encounter: Payer: 59 | Admitting: Physical Therapy

## 2018-06-23 ENCOUNTER — Ambulatory Visit: Payer: 59 | Admitting: Physical Therapy

## 2018-06-23 ENCOUNTER — Encounter: Payer: Self-pay | Admitting: Physical Therapy

## 2018-06-23 DIAGNOSIS — F418 Other specified anxiety disorders: Secondary | ICD-10-CM | POA: Diagnosis not present

## 2018-06-23 DIAGNOSIS — R262 Difficulty in walking, not elsewhere classified: Secondary | ICD-10-CM

## 2018-06-23 DIAGNOSIS — M6281 Muscle weakness (generalized): Secondary | ICD-10-CM | POA: Diagnosis not present

## 2018-06-23 DIAGNOSIS — M545 Low back pain: Secondary | ICD-10-CM | POA: Diagnosis not present

## 2018-06-23 DIAGNOSIS — M48061 Spinal stenosis, lumbar region without neurogenic claudication: Secondary | ICD-10-CM | POA: Diagnosis not present

## 2018-06-23 DIAGNOSIS — G8929 Other chronic pain: Secondary | ICD-10-CM

## 2018-06-23 DIAGNOSIS — M25671 Stiffness of right ankle, not elsewhere classified: Secondary | ICD-10-CM | POA: Diagnosis not present

## 2018-06-23 NOTE — Therapy (Signed)
[place], [name], [address] Phone: [phone]   Fax:  [phone]  Physical Therapy Treatment  Patient Details  Name: [name] MRN: [id number] Date of Birth: [birth year] Referring Provider: [name], MD   Encounter Date: 06/23/2018  PT End of Session - 06/23/18 1105    Visit Number  13    Number of Visits  24    Date for PT Re-Evaluation  08/13/18    Authorization Type  [facility]    PT Start Time  1100    PT Stop Time  1149    PT Time Calculation (min)  49 min       Past Medical History:  Diagnosis Date  . Anxiety   . Back pain    related to spinal stenosis and disc problem, radiates down left buttocks to leg., weakness occ.  [name] Headache   . Hyperlipidemia   . Hypertension   . PONV (postoperative nausea and vomiting)   . Vaginal foreign object    "Uses Femring"    Past Surgical History:  Procedure Laterality Date  . ABDOMINAL HYSTERECTOMY    . CARDIAC CATHETERIZATION N/A 04/18/2015   Procedure: Left Heart Cath and Coronary Angiography;  Surgeon: [name], MD;  Location: [facility] [facility];  Service: Cardiovascular;  Laterality: N/A;  . FOOT SURGERY Bilateral    [name] "bunion,bone spur, tendon" (1) -6'16, (1)-10'16  . LUMBAR LAMINECTOMY/DECOMPRESSION MICRODISCECTOMY Bilateral 12/28/2015   Procedure: MICRO LUMBAR DECOMPRESSION L4 - L5 BILATERALLY;  Surgeon: Cheryl Day, MD;  [facility];  Service: Orthopedics;  Laterality: Bilateral;  . LUMBAR LAMINECTOMY/DECOMPRESSION MICRODISCECTOMY Bilateral 03/04/2018   Procedure: Revision of Microlumbar Decompression Bilateral Lumbar Four-Five;  Surgeon: Cheryl Day, MD;  Location: [place];  Service: Orthopedics;  Laterality: Bilateral;  90 mins  . WISDOM TOOTH EXTRACTION    . WOUND EXPLORATION N/A 03/04/2018   Procedure: EXPLORATION OF LUMBAR DECOMPRESSION WOUND;  Surgeon: Cheryl Day, MD;  Location: [place];  Service:  Orthopedics;  Laterality: N/A;    There were no vitals filed for this visit.  Subjective Assessment - 06/23/18 1104    Subjective  Brother is in hospital, in a coma, trying to come out.     Currently in Pain?  Yes    Pain Score  5     Pain Location  Back    Pain Orientation  Right;Lower    Pain Descriptors / Indicators  Aching    Aggravating Factors   prolonged sitting    Pain Relieving Factors  stretch , ultrasound, tape                        [facility] Adult PT Treatment/Exercise - 06/23/18 0001      Lumbar Exercises: Stretches   Other Lumbar Stretch Exercise  quadratus lumborum stretch sitting with gentle rottion       Knee/Hip Exercises: Stretches   Other Knee/Hip Stretches  seated ball roll out forward and lateral       Knee/Hip Exercises: Standing   Other Standing Knee Exercises  marching RT and LT 10 reps, hip abduction and extension x 10 each, cues for knee stability left       Knee/Hip Exercises: Seated   Long Arc Quad  Right;Strengthening;10 reps;2 sets    Sit to [employer]  10 reps;without UE support      Ultrasound   Ultrasound Location  Rt lowr back     Ultrasound  Parameters  50% 1 mhz 1.2 w/cm2 x 8 min      Manual Therapy   Soft tissue mobilization  RT loweth toolr back                 PT Short Term Goals - 06/03/18 0932      PT SHORT TERM GOAL #1   Title  She will be independent with initial HEP     Status  On-going      PT SHORT TERM GOAL #2   Title  Pt will be able to demo posture and body mechanics as it relates to lumbar spine  with bending and lifting     Status  Achieved      PT SHORT TERM GOAL #3   Title  She will improve Berg score to demo safety with walking with SPC    Status  Unable to assess      PT SHORT TERM GOAL #4   Title  She will demo correctl flexion of RT leg with walking with SPC without LOB    Status  On-going        PT Long Term Goals - 05/20/18 0856      PT LONG TERM GOAL #1   Title  She will be  independent with all HEP issued    Time  12    Period  Weeks    Status  New      PT LONG TERM GOAL #2   Title  Pt will tolerate sitting for 1 hour without pain by 04/03/16    Time  12    Period  Weeks    Status  New      PT LONG TERM GOAL #4   Title  She will demo stairs with step over step pattern with one rail safely    Time  12    Period  Weeks    Status  New      PT LONG TERM GOAL #5   Title  FOTO score to improve to   50% limited to demo improved function    Time  12    Period  Weeks    Status  New      PT LONG TERM GOAL #6   Title  She will walk in home without device and out of home with [facility] to improve community access    Time  12    Period  Weeks    Status  New      PT LONG TERM GOAL #7   Title  She will tolerate 90 min on feet for home and community activity    Time  12    Period  Weeks    Status  New            Plan - 06/23/18 1207    Clinical Impression Statement  Continued strengthening as well as modalities for Rt low back pain. She reports [place] and tape helpful last visit. Her brother is still in the hospital so she is sitting alot which is contributing to her increased LBP.     PT Next Visit Plan  Strengthening , gait and balance standing and steps.  continue to keep ankle flexible, recumbant .  Some attention to RT LBP as needed    PT Home Exercise Plan  transversus abdominus. standing weightbearing and standing left hip flexion, knee step ups with assistance with glut activation, quadratus stretch    Consulted and Agree with Plan of Care  Patient       Patient will benefit from skilled therapeutic intervention in order to improve the following deficits and impairments:  Pain, Postural dysfunction, Difficulty walking, Decreased strength, Decreased activity tolerance  Visit Diagnosis: Spinal stenosis at L4-L5 level  Chronic right-sided low back pain, with sciatica presence unspecified  Muscle weakness (generalized)  Difficulty in walking, not  elsewhere classified     Problem List Patient Active Problem List   Diagnosis Date Noted  . Hyperlipidemia 04/16/2018  . Generalized anxiety disorder with panic attacks 04/16/2018  . AKI (acute kidney injury) ([place])   . Benign essential HTN   . Depression with anxiety   . Lumbar radiculopathy 03/09/2018  . Paraparesis ([place]) 03/09/2018  . Essential hypertension   . Chronic nonintractable headache   . Leukocytosis   . Acute blood loss anemia   . Postoperative pain   . Generalized weakness   . Spinal stenosis at L4-L5 level 03/04/2018  . Spinal stenosis of lumbar region 12/28/2015  . Chest pain 04/16/2015    Dorene Ar, PTA 06/23/2018, 12:09 PM  [facility] [address] [place], [name], [address] Phone: [phone]   Fax:  [phone]  Name: [name] MRN: [id number] Date of Birth: [birth year]

## 2018-06-24 ENCOUNTER — Encounter: Payer: 59 | Admitting: Physical Therapy

## 2018-06-24 MED FILL — OXYCODONE-ACETAMINOPHEN 5-3: 5-325 | 10 days supply | Qty: 20 | Fill #0

## 2018-06-25 ENCOUNTER — Ambulatory Visit: Payer: 59 | Admitting: Physical Therapy

## 2018-06-29 ENCOUNTER — Ambulatory Visit: Payer: 59 | Attending: Specialist

## 2018-06-29 DIAGNOSIS — M545 Low back pain: Secondary | ICD-10-CM | POA: Diagnosis not present

## 2018-06-29 DIAGNOSIS — G8929 Other chronic pain: Secondary | ICD-10-CM | POA: Insufficient documentation

## 2018-06-29 DIAGNOSIS — M48061 Spinal stenosis, lumbar region without neurogenic claudication: Secondary | ICD-10-CM | POA: Insufficient documentation

## 2018-06-29 DIAGNOSIS — R262 Difficulty in walking, not elsewhere classified: Secondary | ICD-10-CM | POA: Diagnosis not present

## 2018-06-29 DIAGNOSIS — M6281 Muscle weakness (generalized): Secondary | ICD-10-CM | POA: Insufficient documentation

## 2018-06-29 DIAGNOSIS — M25671 Stiffness of right ankle, not elsewhere classified: Secondary | ICD-10-CM | POA: Diagnosis not present

## 2018-06-29 NOTE — Therapy (Signed)
[place], [name], [address] Phone: [phone]   Fax:  [phone]  Physical Therapy Treatment  Patient Details  Name: [name] MRN: [id number] Date of Birth: 11-20-1960 Referring Provider: [name], MD   Encounter Date: 06/29/2018  PT End of Session - 06/29/18 1053    Visit Number  14    Number of Visits  24    Date for PT Re-Evaluation  08/13/18    Authorization Type  [facility]    PT Start Time  1054    PT Stop Time  1140    PT Time Calculation (min)  46 min    Activity Tolerance  Patient tolerated treatment well    Behavior During Therapy  [facility] for tasks assessed/performed       Past Medical History:  Diagnosis Date  . Anxiety   . Back pain    related to spinal stenosis and disc problem, radiates down left buttocks to leg., weakness occ.  [name] Headache   . Hyperlipidemia   . Hypertension   . PONV (postoperative nausea and vomiting)   . Vaginal foreign object    "Uses Femring"    Past Surgical History:  Procedure Laterality Date  . ABDOMINAL HYSTERECTOMY    . CARDIAC CATHETERIZATION N/A 04/18/2015   Procedure: Left Heart Cath and Coronary Angiography;  Surgeon: [name], MD;  Location: [place] [facility];  Service: Cardiovascular;  Laterality: N/A;  . FOOT SURGERY Bilateral    [place] "bunion,bone spur, tendon" (1) -6'16, (1)-10'16  . LUMBAR LAMINECTOMY/DECOMPRESSION MICRODISCECTOMY Bilateral 12/28/2015   Procedure: MICRO LUMBAR DECOMPRESSION L4 - L5 BILATERALLY;  Surgeon: [name], MD;  [facility];  Service: Orthopedics;  Laterality: Bilateral;  . LUMBAR LAMINECTOMY/DECOMPRESSION MICRODISCECTOMY Bilateral 03/04/2018   Procedure: Revision of Microlumbar Decompression Bilateral Lumbar Four-Five;  Surgeon: [name], MD;  Location: [place];  Service: Orthopedics;  Laterality: Bilateral;  90 mins  . WISDOM TOOTH EXTRACTION    . WOUND EXPLORATION N/A  03/04/2018   Procedure: EXPLORATION OF LUMBAR DECOMPRESSION WOUND;  Surgeon: [name], MD;  Location: [place];  Service: Orthopedics;  Laterality: N/A;    There were no vitals filed for this visit.  Subjective Assessment - 06/29/18 1100    Subjective  She feels stronger and can get out of chairs easier at work.     Currently in Pain?  Yes    Pain Score  4     Pain Location  Back    Pain Orientation  Right;Lower    Pain Descriptors / Indicators  Aching    Pain Type  Surgical pain    Pain Onset  More than a month ago    Pain Frequency  Constant    Aggravating Factors   sitting    Pain Relieving Factors  stretch , modalites         [facility] PT Assessment - 06/29/18 0001      Berg Balance Test   Sit to Stand  Able to stand  independently using hands    Standing Unsupported  Able to stand 30 seconds unsupported    Sitting with Back Unsupported but Feet Supported on Floor or Stool  Able to sit safely and securely 2 minutes    Stand to Sit  Sits safely with minimal use of hands    Transfers  Able to transfer safely, definite need of hands    Standing Unsupported with Eyes Closed  Needs help to keep  from falling    Standing Ubsupported with Feet Together  Able to place feet together independently but unable to hold for 30 seconds    From Standing, Reach Forward with Outstretched Arm  Can reach forward >5 cm safely (2")    From Standing Position, Pick up Object from Floor  Unable to pick up and needs supervision    From Standing Position, Turn to Look Behind Over each Shoulder  Looks behind from both sides and weight shifts well    Turn 360 Degrees  Needs close supervision or verbal cueing    Standing Unsupported, Alternately Place Feet on Step/Stool  Able to complete >2 steps/needs minimal assist    Standing Unsupported, One Foot in [place] help to step but can hold 15 seconds    Standing on One Leg  Unable to try or needs assist to prevent fall    Total Score  28    Berg comment:   Still in score for  need for walker to decr fall risk                   [facility] Adult PT Treatment/Exercise - 06/29/18 0001      Neuro Re-ed    Neuro Re-ed Details   standing at counter practicing weight shift with glut contraction moving forwa to center over BOS      Lumbar Exercises: Stretches   Other Lumbar Stretch Exercise  quadratus lumborum stretch sitting with gentle rottion       Knee/Hip Exercises: Aerobic   Nustep  L3 LE x 5 min      Knee/Hip Exercises: Standing   Other Standing Knee Exercises  marching RT and LT 10 reps, hip abduction and extension x 15 each, cues for knee stability  and flexiong of knee and hip with extension      Knee/Hip Exercises: Seated   Long Arc Quad  Right;Strengthening;10 reps;2 sets    Sit to [employer]  10 reps;without UE support      Ultrasound   Ultrasound Parameters  --      Manual Therapy   Joint Mobilization  Long axis pull RT leg ., Knee to chest and abduction of hip. 30 sec x 2 each RT     Soft tissue mobilization  RT lower back                PT Short Term Goals - 06/29/18 1145      PT SHORT TERM GOAL #1   Title  She will be independent with initial HEP     Status  Achieved      PT SHORT TERM GOAL #2   Title  Pt will be able to demo posture and body mechanics as it relates to lumbar spine  with bending and lifting     Status  Achieved      PT SHORT TERM GOAL #3   Title  She will improve Berg score to demo safety with walking with SPC    Baseline  incr of 4 points to 28/56    Status  On-going      PT SHORT TERM GOAL #4   Title  She will demo correctl flexion of RT leg with walking with SPC without LOB    Baseline  better with verbal cues    Status  On-going        PT Long Term Goals - 05/20/18 0856      PT LONG TERM GOAL #1   Title  She will be independent with all HEP issued    Time  12    Period  Weeks    Status  New      PT LONG TERM GOAL #2   Title  Pt will tolerate sitting for 1 hour without  pain by 04/03/16    Time  12    Period  Weeks    Status  New      PT LONG TERM GOAL #4   Title  She will demo stairs with step over step pattern with one rail safely    Time  12    Period  Weeks    Status  New      PT LONG TERM GOAL #5   Title  FOTO score to improve to   50% limited to demo improved function    Time  12    Period  Weeks    Status  New      PT LONG TERM GOAL #6   Title  She will walk in home without device and out of home with [facility] to improve community access    Time  12    Period  Weeks    Status  New      PT LONG TERM GOAL #7   Title  She will tolerate 90 min on feet for home and community activity    Time  12    Period  Weeks    Status  New            Plan - 06/29/18 1053    Clinical Impression Statement  Improved BERG score but stilllow and need swalker for safe walking . She also has dec balance with awareness of body position as she falls backward  in static standing and worked on awareness of this    PT Treatment/Interventions  Manual techniques;Patient/family education;Therapeutic exercise;Therapeutic activities;Cryotherapy;Gait training;Stair training    PT Next Visit Plan  Strengthening , gait and balance standing and steps.  continue to keep ankle flexible, recumbant .  Some attention to RT LBP as needed    PT Home Exercise Plan  transversus abdominus. standing weightbearing and standing left hip flexion, knee step ups with assistance with glut activation, quadratus stretch, short hip controlled. fall back to counter with glut pull away to erect standing    Consulted and Agree with Plan of Care  Patient       Patient will benefit from skilled therapeutic intervention in order to improve the following deficits and impairments:  Pain, Postural dysfunction, Difficulty walking, Decreased strength, Decreased activity tolerance  Visit Diagnosis: Spinal stenosis at L4-L5 level  Chronic right-sided low back pain, with sciatica presence  unspecified  Muscle weakness (generalized)  Difficulty in walking, not elsewhere classified     Problem List Patient Active Problem List   Diagnosis Date Noted  . Hyperlipidemia 04/16/2018  . Generalized anxiety disorder with panic attacks 04/16/2018  . AKI (acute kidney injury) ([place])   . Benign essential HTN   . Depression with anxiety   . Lumbar radiculopathy 03/09/2018  . Paraparesis ([place]) 03/09/2018  . Essential hypertension   . Chronic nonintractable headache   . Leukocytosis   . Acute blood loss anemia   . Postoperative pain   . Generalized weakness   . Spinal stenosis at L4-L5 level 03/04/2018  . Spinal stenosis of lumbar region 12/28/2015  . Chest pain 04/16/2015    Darrel Hoover  PT 06/29/2018, 11:46 AM  [facility]  [place], [name], [address] Phone: [phone]   Fax:  [phone]  Name: [name] MRN: [id number] Date of Birth: June 22, 1961

## 2018-07-01 ENCOUNTER — Ambulatory Visit: Payer: 59

## 2018-07-01 DIAGNOSIS — R262 Difficulty in walking, not elsewhere classified: Secondary | ICD-10-CM | POA: Diagnosis not present

## 2018-07-01 DIAGNOSIS — M48061 Spinal stenosis, lumbar region without neurogenic claudication: Secondary | ICD-10-CM | POA: Diagnosis not present

## 2018-07-01 DIAGNOSIS — G8929 Other chronic pain: Secondary | ICD-10-CM | POA: Diagnosis not present

## 2018-07-01 DIAGNOSIS — M545 Low back pain: Secondary | ICD-10-CM | POA: Diagnosis not present

## 2018-07-01 DIAGNOSIS — M6281 Muscle weakness (generalized): Secondary | ICD-10-CM

## 2018-07-01 DIAGNOSIS — M25671 Stiffness of right ankle, not elsewhere classified: Secondary | ICD-10-CM | POA: Diagnosis not present

## 2018-07-01 NOTE — Therapy (Signed)
[place], [name], [address] Phone: [phone]   Fax:  [phone]  Physical Therapy Treatment  Patient Details  Name: [name] MRN: [id number] Date of Birth: 1961/02/20 Referring Provider: [name], MD   Encounter Date: 07/01/2018  PT End of Session - 07/01/18 1100    Visit Number  15    Number of Visits  24    Date for PT Re-Evaluation  08/13/18    Authorization Type  [facility]    PT Start Time  1100    PT Stop Time  1140    PT Time Calculation (min)  40 min    Activity Tolerance  Patient tolerated treatment well    Behavior During Therapy  [facility] for tasks assessed/performed       Past Medical History:  Diagnosis Date  . Anxiety   . Back pain    related to spinal stenosis and disc problem, radiates down left buttocks to leg., weakness occ.  [name] Headache   . Hyperlipidemia   . Hypertension   . PONV (postoperative nausea and vomiting)   . Vaginal foreign object    "Uses Femring"    Past Surgical History:  Procedure Laterality Date  . ABDOMINAL HYSTERECTOMY    . CARDIAC CATHETERIZATION N/A 04/18/2015   Procedure: Left Heart Cath and Coronary Angiography;  Surgeon: [name], MD;  Location: [place] [facility];  Service: Cardiovascular;  Laterality: N/A;  . FOOT SURGERY Bilateral    [place] "bunion,bone spur, tendon" (1) -6'16, (1)-10'16  . LUMBAR LAMINECTOMY/DECOMPRESSION MICRODISCECTOMY Bilateral 12/28/2015   Procedure: MICRO LUMBAR DECOMPRESSION L4 - L5 BILATERALLY;  Surgeon: [name], MD;  [facility];  Service: Orthopedics;  Laterality: Bilateral;  . LUMBAR LAMINECTOMY/DECOMPRESSION MICRODISCECTOMY Bilateral 03/04/2018   Procedure: Revision of Microlumbar Decompression Bilateral Lumbar Four-Five;  Surgeon: [name], MD;  Location: [facility];  Service: Orthopedics;  Laterality: Bilateral;  90 mins  . WISDOM TOOTH EXTRACTION    . WOUND EXPLORATION N/A  03/04/2018   Procedure: EXPLORATION OF LUMBAR DECOMPRESSION WOUND;  Surgeon: [name], MD;  Location: [place];  Service: Orthopedics;  Laterality: N/A;    There were no vitals filed for this visit.  Subjective Assessment - 07/01/18 1101    Subjective  She is about the same    Pain Score  4     Pain Location  Back    Pain Orientation  Right;Lower    Pain Descriptors / Indicators  Aching    Pain Type  Surgical pain    Pain Onset  More than a month ago    Pain Frequency  Constant         [facility] PT Assessment - 07/01/18 0001      Strength   Right Hip Flexion  3+/5    Right Hip Extension  3-/5    Right Hip External Rotation   3+/5    Right Hip Internal Rotation  4+/5    Right Hip ABduction  3/5                   [facility] Adult PT Treatment/Exercise - 07/01/18 0001      Self-Care   Other Self-Care Comments   Worked on options for walkking on the beach and suggested use of walker without wheels or getting assist with belt and hansdhold from spouse.       Exercises   Exercises  Knee/Hip      Lumbar Exercises: Supine  Pelvic Tilt  10 reps;10 seconds      Knee/Hip Exercises: Aerobic   Nustep  L5 LE x 6 min      Knee/Hip Exercises: Supine   Short Arc Quad Sets  Right;15 reps    Short Arc Quad Sets Limitations  press into ball and lift heel same time    [employer]  15 reps    Bridges with [employer]  15 reps    Straight Leg Raises  Right;2 sets;10 reps    Other Supine Knee/Hip Exercises  RT hip and knee flexion x10       Knee/Hip Exercises: Sidelying   Hip ABduction  Right;Left;15 reps    Clams  10 reps and reverse clams x 10 RT  also extension with abduction from clam position             PT Education - 07/01/18 1145    Education Details  walking on sand at [employer]) Educated  Patient    Methods  Explanation    Comprehension  Verbalized understanding       PT Short Term Goals - 06/29/18 1145      PT SHORT TERM GOAL #1   Title  She will  be independent with initial HEP     Status  Achieved      PT SHORT TERM GOAL #2   Title  Pt will be able to demo posture and body mechanics as it relates to lumbar spine  with bending and lifting     Status  Achieved      PT SHORT TERM GOAL #3   Title  She will improve Berg score to demo safety with walking with SPC    Baseline  incr of 4 points to 28/56    Status  On-going      PT SHORT TERM GOAL #4   Title  She will demo correctl flexion of RT leg with walking with SPC without LOB    Baseline  better with verbal cues    Status  On-going        PT Long Term Goals - 05/20/18 0856      PT LONG TERM GOAL #1   Title  She will be independent with all HEP issued    Time  12    Period  Weeks    Status  New      PT LONG TERM GOAL #2   Title  Pt will tolerate sitting for 1 hour without pain by 04/03/16    Time  12    Period  Weeks    Status  New      PT LONG TERM GOAL #4   Title  She will demo stairs with step over step pattern with one rail safely    Time  12    Period  Weeks    Status  New      PT LONG TERM GOAL #5   Title  FOTO score to improve to   50% limited to demo improved function    Time  12    Period  Weeks    Status  New      PT LONG TERM GOAL #6   Title  She will walk in home without device and out of home with [facility] to improve community access    Time  12    Period  Weeks    Status  New      PT LONG TERM GOAL #7  Title  She will tolerate 90 min on feet for home and community activity    Time  12    Period  Weeks    Status  New            Plan - 07/01/18 1101    Clinical Impression Statement  Improved lower extermoty strength RT. She should do well at beach as long as she is patient.  Continue strenght and balance    PT Treatment/Interventions  Manual techniques;Patient/family education;Therapeutic exercise;Therapeutic activities;Cryotherapy;Gait training;Stair training    PT Next Visit Plan  Strengthening , gait and balance standing and steps.   continue to keep ankle flexible, recumbant .  Some attention to RT LBP as needed    PT Home Exercise Plan  transversus abdominus. standing weightbearing and standing left hip flexion, knee step ups with assistance with glut activation, quadratus stretch, short hip controlled. fall back to counter with glut pull away to erect standing    Consulted and Agree with Plan of Care  Patient       Patient will benefit from skilled therapeutic intervention in order to improve the following deficits and impairments:  Pain, Postural dysfunction, Difficulty walking, Decreased strength, Decreased activity tolerance  Visit Diagnosis: Spinal stenosis at L4-L5 level  Chronic right-sided low back pain, with sciatica presence unspecified  Muscle weakness (generalized)  Difficulty in walking, not elsewhere classified     Problem List Patient Active Problem List   Diagnosis Date Noted  . Hyperlipidemia 04/16/2018  . Generalized anxiety disorder with panic attacks 04/16/2018  . AKI (acute kidney injury) ([place])   . Benign essential HTN   . Depression with anxiety   . Lumbar radiculopathy 03/09/2018  . Paraparesis ([place]) 03/09/2018  . Essential hypertension   . Chronic nonintractable headache   . Leukocytosis   . Acute blood loss anemia   . Postoperative pain   . Generalized weakness   . Spinal stenosis at L4-L5 level 03/04/2018  . Spinal stenosis of lumbar region 12/28/2015  . Chest pain 04/16/2015    Darrel Hoover  PT 07/01/2018, 11:48 AM  [facility] [address] [place], [name], [address] Phone: [phone]   Fax:  [phone]  Name: CHAYLA SHANDS MRN: [id number] Date of Birth: [birth year]

## 2018-07-05 ENCOUNTER — Other Ambulatory Visit: Payer: Self-pay

## 2018-07-05 ENCOUNTER — Encounter: Payer: Self-pay | Admitting: Physical Medicine & Rehabilitation

## 2018-07-05 ENCOUNTER — Encounter: Payer: 59 | Attending: Physical Medicine & Rehabilitation | Admitting: Physical Medicine & Rehabilitation

## 2018-07-05 VITALS — BP 159/96 | HR 75 | Ht 65.0 in | Wt 150.0 lb

## 2018-07-05 DIAGNOSIS — Z808 Family history of malignant neoplasm of other organs or systems: Secondary | ICD-10-CM | POA: Diagnosis not present

## 2018-07-05 DIAGNOSIS — Z807 Family history of other malignant neoplasms of lymphoid, hematopoietic and related tissues: Secondary | ICD-10-CM | POA: Insufficient documentation

## 2018-07-05 DIAGNOSIS — I10 Essential (primary) hypertension: Secondary | ICD-10-CM | POA: Diagnosis not present

## 2018-07-05 DIAGNOSIS — Z803 Family history of malignant neoplasm of breast: Secondary | ICD-10-CM | POA: Insufficient documentation

## 2018-07-05 DIAGNOSIS — Z09 Encounter for follow-up examination after completed treatment for conditions other than malignant neoplasm: Secondary | ICD-10-CM | POA: Diagnosis not present

## 2018-07-05 DIAGNOSIS — Z9889 Other specified postprocedural states: Secondary | ICD-10-CM | POA: Insufficient documentation

## 2018-07-05 DIAGNOSIS — Z801 Family history of malignant neoplasm of trachea, bronchus and lung: Secondary | ICD-10-CM | POA: Diagnosis not present

## 2018-07-05 DIAGNOSIS — F419 Anxiety disorder, unspecified: Secondary | ICD-10-CM | POA: Insufficient documentation

## 2018-07-05 DIAGNOSIS — Z90710 Acquired absence of both cervix and uterus: Secondary | ICD-10-CM | POA: Insufficient documentation

## 2018-07-05 DIAGNOSIS — M62838 Other muscle spasm: Secondary | ICD-10-CM | POA: Diagnosis not present

## 2018-07-05 DIAGNOSIS — M5416 Radiculopathy, lumbar region: Secondary | ICD-10-CM | POA: Insufficient documentation

## 2018-07-05 DIAGNOSIS — Z809 Family history of malignant neoplasm, unspecified: Secondary | ICD-10-CM | POA: Insufficient documentation

## 2018-07-05 DIAGNOSIS — Z800 Family history of malignant neoplasm of digestive organs: Secondary | ICD-10-CM | POA: Insufficient documentation

## 2018-07-05 DIAGNOSIS — S39012S Strain of muscle, fascia and tendon of lower back, sequela: Secondary | ICD-10-CM | POA: Diagnosis not present

## 2018-07-05 MED ORDER — TRAMADOL HCL 50 MG PO TABS: 50.0000 mg | ORAL_TABLET | Freq: Four times a day (QID) | ORAL | 0 refills | Status: DC | PRN

## 2018-07-05 MED FILL — traMADol HCL 50 MG TABS: 50 | 5 days supply | Qty: 21 | Fill #0

## 2018-07-05 NOTE — Progress Notes (Signed)
Subjective:    Patient ID: [name], female    DOB: 10-17-1961, 57 y.o.   MRN: [id number]  HPI   [name] is here in follow-up of her lumbar radiculopathies and associated gait deficits.  Last saw her in early July. She has been doing fairly well until she fell while after getting up from a chair while at a concert. She may have tripped on a stick while she was trying catch herself. She is hurting more in her low back as well as her left hip. She has used a heating pad as well as tylenol. She took a robaxin which didn't help a great deal. She was out of tramadol and percocet. She denies any bruising or breaks in the skin.   She denies any new weakness in either leg nor changes in sensation.  She continues to use her right AFO for balance.  She uses a Program[employer] multimedia for gait.  Pain Inventory Average Pain 5 Pain Right Now 5 My pain is burning, tingling and aching  In the last 24 hours, has pain interfered with the following? General activity 4 Relation with others 4 Enjoyment of life 4 What TIME of day is your pain at its worst? daytime and night Sleep (in general) Fair  Pain is worse with: walking Pain improves with: rest and medication Relief from Meds: 6  Mobility use a cane use a walker ability to climb steps?  yes do you drive?  no  Function employed # of hrs/week 12 I need assistance with the following:  household duties  Neuro/Psych tingling trouble walking spasms anxiety  Prior Studies Any changes since last visit?  no  Physicians involved in your care Any changes since last visit?  no   Family History  Problem Relation Age of Onset  . Heart attack Mother   . Lung cancer Father   . Cancer Father   . Pancreatic cancer Sister   . Breast cancer Sister 39  . Throat cancer Brother   . Multiple myeloma Sister   . Breast cancer Sister        diagnosed in her 62's  . Heart attack Sister   . Stomach cancer Cousin   . Colon cancer Neg Hx     Social History   Socioeconomic History  . Marital status: Married    Spouse name: Not on file  . Number of children: Not on file  . Years of education: Not on file  . Highest education level: Not on file  Occupational History  . Not on file  Social Needs  . Financial resource strain: Not on file  . Food insecurity:    Worry: Not on file    Inability: Not on file  . Transportation needs:    Medical: Not on file    Non-medical: Not on file  Tobacco Use  . Smoking status: Never Smoker  . Smokeless tobacco: Never Used  Substance and Sexual Activity  . Alcohol use: No  . Drug use: No  . Sexual activity: Not on file  Lifestyle  . Physical activity:    Days per week: Not on file    Minutes per session: Not on file  . Stress: Not on file  Relationships  . Social connections:    Talks on phone: Not on file    Gets together: Not on file    Attends religious service: Not on file    Active member of club or organization: Not on file  Attends meetings of clubs or organizations: Not on file    Relationship status: Not on file  Other Topics Concern  . Not on file  Social History Narrative  . Not on file   Past Surgical History:  Procedure Laterality Date  . ABDOMINAL HYSTERECTOMY    . CARDIAC CATHETERIZATION N/A 04/18/2015   Procedure: Left Heart Cath and Coronary Angiography;  Surgeon: [name], MD;  Location: [facility];  Service: Cardiovascular;  Laterality: N/A;  . FOOT SURGERY Bilateral    [place] "bunion,bone spur, tendon" (1) -6'16, (1)-10'16  . LUMBAR LAMINECTOMY/DECOMPRESSION MICRODISCECTOMY Bilateral 12/28/2015   Procedure: MICRO LUMBAR DECOMPRESSION L4 - L5 BILATERALLY;  Surgeon: [name], MD;  [facility];  Service: Orthopedics;  Laterality: Bilateral;  . LUMBAR LAMINECTOMY/DECOMPRESSION MICRODISCECTOMY Bilateral 03/04/2018   Procedure: Revision of Microlumbar Decompression Bilateral Lumbar Four-Five;  Surgeon: [name], MD;   Location: [place];  Service: Orthopedics;  Laterality: Bilateral;  90 mins  . WISDOM TOOTH EXTRACTION    . WOUND EXPLORATION N/A 03/04/2018   Procedure: EXPLORATION OF LUMBAR DECOMPRESSION WOUND;  Surgeon: [name], MD;  Location: [place];  Service: Orthopedics;  Laterality: N/A;   Past Medical History:  Diagnosis Date  . Anxiety   . Back pain    related to spinal stenosis and disc problem, radiates down left buttocks to leg., weakness occ.  [name] Headache   . Hyperlipidemia   . Hypertension   . PONV (postoperative nausea and vomiting)   . Vaginal foreign object    "Uses Femring"   BP (!) 159/96   Pulse 75   Ht 5' 5" (1.651 m)   Wt 150 lb (68 kg)   SpO2 97%   BMI 24.96 kg/m   Opioid Risk Score:   Fall Risk Score:  `1  Depression screen PHQ 2/9  Depression screen [facility] 2/9 07/05/2018 05/03/2018 04/01/2018  Decreased Interest 0 0 1  Down, Depressed, Hopeless 0 0 1  PHQ - 2 Score 0 0 2  Altered sleeping - - 1  Tired, decreased energy - - 1  Change in appetite - - 1  Feeling bad or failure about yourself  - - 0  Trouble concentrating - - 0  Moving slowly or fidgety/restless - - 0  Suicidal thoughts - - 0  PHQ-9 Score - - 5  Difficult doing work/chores - - Somewhat difficult    Review of Systems  Constitutional: Positive for diaphoresis.  HENT: Negative.   Eyes: Negative.   Respiratory: Negative.   Cardiovascular: Negative.   Gastrointestinal: Negative.   Endocrine: Negative.   Genitourinary: Negative.   Musculoskeletal: Negative.   Skin: Negative.   Allergic/Immunologic: Negative.   Neurological: Negative.   Hematological: Negative.   Psychiatric/Behavioral: Negative.   All other systems reviewed and are negative.      Objective:   Physical Exam General: Alert and oriented x 3, No apparent distress General: No acute distress HEENT: EOMI, oral membranes moist Cards: reg rate  Chest: normal effort Abdomen: Soft, NT, ND Skin: dry, intact Extremities: no  edema Skin: Clean and intact without signs of breakdown Neuro: Pt is cognitively appropriate with normal insight, memory, and awareness. Cranial nerves 2-12 are intact. Sensory decreased right foot.    Fine motor coordination is intact. No tremors. Motor function is grossly 5/5 UE, RLE 4/5 HF, KE and ADF/PF 2/5.  Is walking a bit better with her AFO with less circumduction of the leg.  Musculoskeletal:  Patient is antalgic to  the left side today.  She is tender with palpation over the lumbar 3 through 5 segments left more than right.  She has pain over the left buttock area as well.  There are no visible bruises or contusions.  I do not palpate focal spasms although her range of motion is slightly impaired to flexion today.  Seated slump test was negative but she did have low back tightness with bending.  Straight leg raising causes back pain. Psych: Pt's affect is appropriate. Pt is cooperative        Assessment & Plan:  1.Functional and mobility deficitssecondary to lumbar stenosis with multiple-level radiculopathies s/p Decompression and re-do.  Patient with recent fall and appears to have had a strain.  See no external signs of damage, no contusions or bruises appreciated.  -We will check x-rays of lumbar spine to assess for any acute changes  -Recommended ongoing heat to her low back  -Continue with physical therapy 2.  . Pain Management:  -Refill tramadol 50 mg every 6 hours as needed for more severe pain.  She also may use Tylenol            -robaxin for spasms.     I recommend that she schedule this 4 times daily for the next 3 to 4 days            -gabapentin tid for neuropathic pain. 4. Mood: much improved, very positive  5. HTN: Per primary Fifteen minutes of face to face patient care time were spent during this visit. All questions were encouraged and answered.  Follow up with me in 2 months.

## 2018-07-05 NOTE — Patient Instructions (Addendum)
PLEASE FEEL FREE TO CALL OUR OFFICE WITH ANY PROBLEMS OR QUESTIONS ([phone])   KEEP UP YOUR HEAT  CONTINUE PHYSICAL THERAPY  SCHEDULED ROBAXIN FOR A 3-4 FOUR DAYS,   TRAMADOL FOR PAIN.

## 2018-07-06 ENCOUNTER — Encounter: Payer: 59 | Admitting: Physical Therapy

## 2018-07-06 ENCOUNTER — Ambulatory Visit: Payer: 59

## 2018-07-06 DIAGNOSIS — G8929 Other chronic pain: Secondary | ICD-10-CM

## 2018-07-06 DIAGNOSIS — R262 Difficulty in walking, not elsewhere classified: Secondary | ICD-10-CM

## 2018-07-06 DIAGNOSIS — M48061 Spinal stenosis, lumbar region without neurogenic claudication: Secondary | ICD-10-CM

## 2018-07-06 DIAGNOSIS — M545 Low back pain: Secondary | ICD-10-CM | POA: Diagnosis not present

## 2018-07-06 DIAGNOSIS — M6281 Muscle weakness (generalized): Secondary | ICD-10-CM | POA: Diagnosis not present

## 2018-07-06 DIAGNOSIS — M25671 Stiffness of right ankle, not elsewhere classified: Secondary | ICD-10-CM | POA: Diagnosis not present

## 2018-07-06 NOTE — Therapy (Signed)
[place], [name], [address] Phone: [phone]   Fax:  [phone]  Physical Therapy Treatment  Patient Details  Name: [name] MRN: [id number] Date of Birth: October 10, 1961 Referring Provider: [name], MD   Encounter Date: 07/06/2018  PT End of Session - 07/06/18 1104    Visit Number  16    Number of Visits  24    Date for PT Re-Evaluation  08/13/18    Authorization Type  [facility]    PT Start Time  1101    PT Stop Time  1200    PT Time Calculation (min)  59 min    Activity Tolerance  Patient limited by pain    Behavior During Therapy  [facility] for tasks assessed/performed       Past Medical History:  Diagnosis Date  . Anxiety   . Back pain    related to spinal stenosis and disc problem, radiates down left buttocks to leg., weakness occ.  [name] Headache   . Hyperlipidemia   . Hypertension   . PONV (postoperative nausea and vomiting)   . Vaginal foreign object    "Uses Femring"    Past Surgical History:  Procedure Laterality Date  . ABDOMINAL HYSTERECTOMY    . CARDIAC CATHETERIZATION N/A 04/18/2015   Procedure: Left Heart Cath and Coronary Angiography;  Surgeon: [name], MD;  Location: [place] [facility];  Service: Cardiovascular;  Laterality: N/A;  . FOOT SURGERY Bilateral    [place] "bunion,bone spur, tendon" (1) -6'16, (1)-10'16  . LUMBAR LAMINECTOMY/DECOMPRESSION MICRODISCECTOMY Bilateral 12/28/2015   Procedure: MICRO LUMBAR DECOMPRESSION L4 - L5 BILATERALLY;  Surgeon: [name], MD;  Location: [facility];  Service: Orthopedics;  Laterality: Bilateral;  . LUMBAR LAMINECTOMY/DECOMPRESSION MICRODISCECTOMY Bilateral 03/04/2018   Procedure: Revision of Microlumbar Decompression Bilateral Lumbar Four-Five;  Surgeon: [name], MD;  Location: [place];  Service: Orthopedics;  Laterality: Bilateral;  90 mins  . WISDOM TOOTH EXTRACTION    . WOUND EXPLORATION N/A 03/04/2018   Procedure: EXPLORATION OF LUMBAR DECOMPRESSION WOUND;  Surgeon: [name], MD;  Location: [place];  Service: Orthopedics;  Laterality: N/A;    There were no vitals filed for this visit.  Subjective Assessment - 07/06/18 1111    Subjective  [place] and twisted and fell to the left to ground. She will have an xray in near future.     Pain Score  5     Pain Location  Back    Pain Orientation  Left;Right;Lower    Pain Descriptors / Indicators  Aching;Sore    Pain Type  Acute pain    Pain Onset  In the past 7 days    Pain Frequency  Constant    Aggravating Factors   activity    Pain Relieving Factors  rest meds                        [facility] Adult PT Treatment/Exercise - 07/06/18 0001      Exercises   Exercises  Lumbar      Lumbar Exercises: Stretches   Single Knee to Chest Stretch  Right;Left;3 reps;30 seconds    Lower Trunk Rotation  5 reps;20 seconds    Lower Trunk Rotation Limitations  RT/LT    Pelvic Tilt  15 reps      Modalities   Modalities  Electrical Stimulation  Moist Heat Therapy   Number Minutes Moist Heat  15 Minutes    Moist Heat Location  Lumbar Spine      Electrical Stimulation   Electrical Stimulation Location  lower back    Electrical Stimulation Action  IFC    Electrical Stimulation Parameters  L8    Electrical Stimulation Goals  Pain      Manual Therapy   Soft tissue mobilization  RT/LT lower back    Kinesiotex  Inhibit Muscle      Kinesiotix   Inhibit Muscle   along spine to mid thoracic  and added RT tape with LT sidebend.                PT Short Term Goals - 06/29/18 1145      PT SHORT TERM GOAL #1   Title  She will be independent with initial HEP     Status  Achieved      PT SHORT TERM GOAL #2   Title  Pt will be able to demo posture and body mechanics as it relates to lumbar spine  with bending and lifting     Status  Achieved      PT SHORT TERM GOAL #3    Title  She will improve Berg score to demo safety with walking with SPC    Baseline  incr of 4 points to 28/56    Status  On-going      PT SHORT TERM GOAL #4   Title  She will demo correctl flexion of RT leg with walking with SPC without LOB    Baseline  better with verbal cues    Status  On-going        PT Long Term Goals - 05/20/18 0856      PT LONG TERM GOAL #1   Title  She will be independent with all HEP issued    Time  12    Period  Weeks    Status  New      PT LONG TERM GOAL #2   Title  Pt will tolerate sitting for 1 hour without pain by 04/03/16    Time  12    Period  Weeks    Status  New      PT LONG TERM GOAL #4   Title  She will demo stairs with step over step pattern with one rail safely    Time  12    Period  Weeks    Status  New      PT LONG TERM GOAL #5   Title  FOTO score to improve to   50% limited to demo improved function    Time  12    Period  Weeks    Status  New      PT LONG TERM GOAL #6   Title  She will walk in home without device and out of home with [facility] to improve community access    Time  12    Period  Weeks    Status  New      PT LONG TERM GOAL #7   Title  She will tolerate 90 min on feet for home and community activity    Time  12    Period  Weeks    Status  New            Plan - 07/06/18 1113    Clinical Impression Statement  New LBP from fall so will be more conservative next 1-3  sessions to see if pain can be decreased  for best exercise. Tender on both sides of back . Some pain into buttocks.     PT Treatment/Interventions  Manual techniques;Patient/family education;Therapeutic exercise;Therapeutic activities;Cryotherapy;Gait training;Stair training    PT Next Visit Plan  Strengthening , gait and balance standing and steps.  continue to keep ankle flexible, recumbant .  Some attention to bilat  LBP as needed. Modalities    PT Home Exercise Plan  transversus abdominus. standing weightbearing and standing left hip flexion, knee  step ups with assistance with glut activation, quadratus stretch, short hip controlled. fall back to counter with glut pull away to erect standing    Consulted and Agree with Plan of Care  Patient       Patient will benefit from skilled therapeutic intervention in order to improve the following deficits and impairments:  Pain, Postural dysfunction, Difficulty walking, Decreased strength, Decreased activity tolerance  Visit Diagnosis: Chronic right-sided low back pain, with sciatica presence unspecified  Muscle weakness (generalized)  Difficulty in walking, not elsewhere classified  Spinal stenosis at L4-L5 level     Problem List Patient Active Problem List   Diagnosis Date Noted  . Hyperlipidemia 04/16/2018  . Generalized anxiety disorder with panic attacks 04/16/2018  . AKI (acute kidney injury) ([place])   . Benign essential HTN   . Depression with anxiety   . Lumbar radiculopathy 03/09/2018  . Paraparesis ([place]) 03/09/2018  . Essential hypertension   . Chronic nonintractable headache   . Leukocytosis   . Acute blood loss anemia   . Postoperative pain   . Generalized weakness   . Spinal stenosis at L4-L5 level 03/04/2018  . Spinal stenosis of lumbar region 12/28/2015  . Chest pain 04/16/2015    Darrel Hoover  PT 07/06/2018, 11:47 AM  [facility] [address] [place], [name], [address] Phone: [phone]   Fax:  [phone]  Name: TYJAI CHARBONNET MRN: [id number] Date of Birth: 28-Nov-1960

## 2018-07-07 ENCOUNTER — Ambulatory Visit
Admission: RE | Admit: 2018-07-07 | Discharge: 2018-07-07 | Disposition: A | Discharge status: Home / Self Care | Payer: 59 | Source: Ambulatory Visit | Attending: Physical Medicine & Rehabilitation | Admitting: Physical Medicine & Rehabilitation

## 2018-07-07 DIAGNOSIS — M5416 Radiculopathy, lumbar region: Secondary | ICD-10-CM

## 2018-07-07 DIAGNOSIS — M545 Low back pain: Secondary | ICD-10-CM | POA: Diagnosis not present

## 2018-07-07 DIAGNOSIS — S3992XA Unspecified injury of lower back, initial encounter: Secondary | ICD-10-CM | POA: Diagnosis not present

## 2018-07-07 DIAGNOSIS — S39012S Strain of muscle, fascia and tendon of lower back, sequela: Secondary | ICD-10-CM

## 2018-07-07 DIAGNOSIS — M62838 Other muscle spasm: Secondary | ICD-10-CM

## 2018-07-08 ENCOUNTER — Encounter: Payer: 59 | Admitting: Physical Therapy

## 2018-07-08 ENCOUNTER — Encounter: Payer: Self-pay | Admitting: Physical Therapy

## 2018-07-08 ENCOUNTER — Ambulatory Visit: Payer: 59 | Admitting: Physical Therapy

## 2018-07-08 DIAGNOSIS — M6281 Muscle weakness (generalized): Secondary | ICD-10-CM | POA: Diagnosis not present

## 2018-07-08 DIAGNOSIS — R262 Difficulty in walking, not elsewhere classified: Secondary | ICD-10-CM | POA: Diagnosis not present

## 2018-07-08 DIAGNOSIS — G8929 Other chronic pain: Secondary | ICD-10-CM

## 2018-07-08 DIAGNOSIS — M545 Low back pain, unspecified: Secondary | ICD-10-CM

## 2018-07-08 DIAGNOSIS — M25671 Stiffness of right ankle, not elsewhere classified: Secondary | ICD-10-CM

## 2018-07-08 DIAGNOSIS — M48061 Spinal stenosis, lumbar region without neurogenic claudication: Secondary | ICD-10-CM

## 2018-07-08 NOTE — Therapy (Addendum)
[place], [name], [address] Phone: [phone]   Fax:  [phone]  Physical Therapy Treatment  Patient Details  Name: SINEAD HOCKMAN MRN: [id number] Date of Birth: April 18, 1961 Referring Provider: [name], MD   Encounter Date: 07/08/2018  PT End of Session - 07/08/18 1217    Visit Number  17    Number of Visits  24    Date for PT Re-Evaluation  08/13/18    PT Start Time  1101    PT Stop Time  1155    PT Time Calculation (min)  54 min    Activity Tolerance  Patient tolerated treatment well    Behavior During Therapy  [facility] for tasks assessed/performed       Past Medical History:  Diagnosis Date  . Anxiety   . Back pain    related to spinal stenosis and disc problem, radiates down left buttocks to leg., weakness occ.  [name] Headache   . Hyperlipidemia   . Hypertension   . PONV (postoperative nausea and vomiting)   . Vaginal foreign object    "Uses Femring"    Past Surgical History:  Procedure Laterality Date  . ABDOMINAL HYSTERECTOMY    . CARDIAC CATHETERIZATION N/A 04/18/2015   Procedure: Left Heart Cath and Coronary Angiography;  Surgeon: [name], MD;  Location: [place] [facility];  Service: Cardiovascular;  Laterality: N/A;  . FOOT SURGERY Bilateral    [place] "bunion,bone spur, tendon" (1) -6'16, (1)-10'16  . LUMBAR LAMINECTOMY/DECOMPRESSION MICRODISCECTOMY Bilateral 12/28/2015   Procedure: MICRO LUMBAR DECOMPRESSION L4 - L5 BILATERALLY;  Surgeon: [name], MD;  [facility];  Service: Orthopedics;  Laterality: Bilateral;  . LUMBAR LAMINECTOMY/DECOMPRESSION MICRODISCECTOMY Bilateral 03/04/2018   Procedure: Revision of Microlumbar Decompression Bilateral Lumbar Four-Five;  Surgeon: [name], MD;  Location: [place];  Service: Orthopedics;  Laterality: Bilateral;  90 mins  . WISDOM TOOTH EXTRACTION    . WOUND EXPLORATION N/A 03/04/2018   Procedure: EXPLORATION OF  LUMBAR DECOMPRESSION WOUND;  Surgeon: [name], MD;  Location: [place];  Service: Orthopedics;  Laterality: N/A;    There were no vitals filed for this visit.  Subjective Assessment - 07/08/18 1113    Subjective  Pain improving  4/10.  Last session helpful.    Currently in Pain?  Yes    Pain Score  4     Pain Location  Back    Pain Orientation  Left;Right;Lower    Pain Type  Acute pain    Aggravating Factors   gettin g up  srom sitting or  stanf=ding too long    Pain Relieving Factors  heat meds,  rest meds.     Effect of Pain on Daily Activities  sometims has trouble sleeping                       [facility] Adult PT Treatment/Exercise - 07/08/18 0001      Self-Care   Other Self-Care Comments   showed scoot to side for sit to stand to decrease pain       Lumbar Exercises: Stretches   Single Knee to Chest Stretch  --   increased pain so stopped   Lower Trunk Rotation  3 reps;20 seconds   while on side   Lower Trunk Rotation Limitations  RT/LT    Other Lumbar Stretch Exercise  hip IR/ ER,  ,  hip flexor tight did not tolerate stretching  Knee/Hip Exercises: Stretches   Passive Hamstring Stretch  2 reps;20 seconds   limited both painful     Moist Heat Therapy   Number Minutes Moist Heat  15 Minutes    Moist Heat Location  Lumbar Spine      Electrical Stimulation   Electrical Stimulation Location  lower back    Electrical Stimulation Action  IFC    Electrical Stimulation Parameters  L4    Electrical Stimulation Goals  Pain      Manual Therapy   Soft tissue mobilization  RT/LT lower back  QL area  gentle light soft tissue work,  did not really get her to relay     Passive ROM  QL PROM both      Kinesiotix   Inhibit Muscle   inhibit paraspinals.               PT Education - 07/08/18 1216    Education Details  remove tape if irritating,  walk more if able with someone.    Person(s) Educated  Patient    Methods  Verbal cues    Comprehension   Verbalized understanding       PT Short Term Goals - 07/08/18 1221      PT SHORT TERM GOAL #1   Title  She will be independent with initial HEP     Time  4    Period  Weeks    Status  Achieved      PT SHORT TERM GOAL #2   Time  4    Period  Weeks    Status  Achieved      PT SHORT TERM GOAL #3   Title  She will improve Berg score to demo safety with walking with SPC    Time  4    Period  Weeks    Status  Unable to assess      PT SHORT TERM GOAL #4   Title  She will demo correctl flexion of RT leg with walking with SPC without LOB    Baseline  improved post session with walker    Time  4    Period  Weeks    Status  On-going        PT Long Term Goals - 05/20/18 0856      PT LONG TERM GOAL #1   Title  She will be independent with all HEP issued    Time  12    Period  Weeks    Status  New      PT LONG TERM GOAL #2   Title  Pt will tolerate sitting for 1 hour without pain by 04/03/16    Time  12    Period  Weeks    Status  New      PT LONG TERM GOAL #4   Title  She will demo stairs with step over step pattern with one rail safely    Time  12    Period  Weeks    Status  New      PT LONG TERM GOAL #5   Title  FOTO score to improve to   50% limited to demo improved function    Time  12    Period  Weeks    Status  New      PT LONG TERM GOAL #6   Title  She will walk in home without device and out of home with [facility] to improve community access    Time  12    Period  Weeks    Status  New      PT LONG TERM GOAL #7   Title  She will tolerate 90 min on feet for home and community activity    Time  12    Period  Weeks    Status  New            Plan - 07/08/18 1219    Clinical Impression Statement  Continued to work on pain decrease from recent fall.  Light manual tolerated.  Bed mobility improved post session.  No new goals met. Extra time needed to remove current tape prior to application.     PT Next Visit Plan  Strengthening , gait and balance standing  and steps.  continue to keep ankle flexible, recumbant .  Some attention to bilat  LBP as needed. Modalities    PT Home Exercise Plan  transversus abdominus. standing weightbearing and standing left hip flexion, knee step ups with assistance with glut activation, quadratus stretch, short hip controlled. fall back to counter with glut pull away to erect standing    Consulted and Agree with Plan of Care  Patient       Patient will benefit from skilled therapeutic intervention in order to improve the following deficits and impairments:     Visit Diagnosis: Chronic right-sided low back pain, with sciatica presence unspecified  Muscle weakness (generalized)  Difficulty in walking, not elsewhere classified  Spinal stenosis at L4-L5 level  Acute right-sided low back pain without sciatica  Stiffness of right ankle joint     Problem List Patient Active Problem List   Diagnosis Date Noted  . Hyperlipidemia 04/16/2018  . Generalized anxiety disorder with panic attacks 04/16/2018  . AKI (acute kidney injury) ([place])   . Benign essential HTN   . Depression with anxiety   . Lumbar radiculopathy 03/09/2018  . Paraparesis ([place]) 03/09/2018  . Essential hypertension   . Chronic nonintractable headache   . Leukocytosis   . Acute blood loss anemia   . Postoperative pain   . Generalized weakness   . Spinal stenosis at L4-L5 level 03/04/2018  . Spinal stenosis of lumbar region 12/28/2015  . Chest pain 04/16/2015    Jsiah Menta PTA 07/08/2018, 12:23 PM  [facility] [address] [place], [name], [address] Phone: [phone]   Fax:  [phone]  Name: JESI JURGENS MRN: [id number] Date of Birth: 06-03-1961  PHYSICAL THERAPY DISCHARGE SUMMARY  Visits from Start of Care: 17  Current functional level related to goals / functional outcomes: Apparently she had exacerbation of symptoms and is scheduled for surgery  Remaining  deficits: She is scheduled for surgery   Education / Equipment: HEP Plan: Patient agrees to discharge.  Patient goals were partially met. Patient is being discharged due to a change in medical status.  ?????   Pearson Forster  PT 07/15/18

## 2018-07-09 ENCOUNTER — Telehealth: Payer: Self-pay | Admitting: Physical Medicine & Rehabilitation

## 2018-07-09 NOTE — Telephone Encounter (Signed)
Pt notified and will talk it over with her husband and will call back on Monday if want the MRI.

## 2018-07-09 NOTE — Telephone Encounter (Signed)
There are some subtle changes along the anterior portion of L3 on xray. Could be small fracture?, there is definitely disc disease at this level. Would recommend MRI if pain is persistent

## 2018-07-12 ENCOUNTER — Emergency Department (HOSPITAL_COMMUNITY): Payer: PRIVATE HEALTH INSURANCE

## 2018-07-12 ENCOUNTER — Other Ambulatory Visit: Payer: Self-pay

## 2018-07-12 ENCOUNTER — Encounter (HOSPITAL_COMMUNITY): Payer: Self-pay

## 2018-07-12 ENCOUNTER — Inpatient Hospital Stay (HOSPITAL_COMMUNITY)
Admission: EM | Admit: 2018-07-12 | Discharge: 2018-07-20 | DRG: 552 | Disposition: A | Discharge status: Rehabilitation Facility | Payer: PRIVATE HEALTH INSURANCE | Attending: Internal Medicine | Admitting: Internal Medicine

## 2018-07-12 DIAGNOSIS — Z90710 Acquired absence of both cervix and uterus: Secondary | ICD-10-CM

## 2018-07-12 DIAGNOSIS — M549 Dorsalgia, unspecified: Secondary | ICD-10-CM | POA: Diagnosis present

## 2018-07-12 DIAGNOSIS — M5116 Intervertebral disc disorders with radiculopathy, lumbar region: Secondary | ICD-10-CM | POA: Diagnosis not present

## 2018-07-12 DIAGNOSIS — Y990 Civilian activity done for income or pay: Secondary | ICD-10-CM

## 2018-07-12 DIAGNOSIS — Z91040 Latex allergy status: Secondary | ICD-10-CM

## 2018-07-12 DIAGNOSIS — F410 Panic disorder [episodic paroxysmal anxiety] without agoraphobia: Secondary | ICD-10-CM | POA: Diagnosis present

## 2018-07-12 DIAGNOSIS — Z91018 Allergy to other foods: Secondary | ICD-10-CM

## 2018-07-12 DIAGNOSIS — W1830XA Fall on same level, unspecified, initial encounter: Secondary | ICD-10-CM | POA: Diagnosis present

## 2018-07-12 DIAGNOSIS — M79606 Pain in leg, unspecified: Secondary | ICD-10-CM | POA: Diagnosis present

## 2018-07-12 DIAGNOSIS — R39198 Other difficulties with micturition: Secondary | ICD-10-CM

## 2018-07-12 DIAGNOSIS — Z8249 Family history of ischemic heart disease and other diseases of the circulatory system: Secondary | ICD-10-CM

## 2018-07-12 DIAGNOSIS — S3992XA Unspecified injury of lower back, initial encounter: Secondary | ICD-10-CM | POA: Diagnosis not present

## 2018-07-12 DIAGNOSIS — S299XXA Unspecified injury of thorax, initial encounter: Secondary | ICD-10-CM | POA: Diagnosis not present

## 2018-07-12 DIAGNOSIS — M545 Low back pain: Secondary | ICD-10-CM | POA: Diagnosis not present

## 2018-07-12 DIAGNOSIS — Z881 Allergy status to other antibiotic agents status: Secondary | ICD-10-CM

## 2018-07-12 DIAGNOSIS — Z808 Family history of malignant neoplasm of other organs or systems: Secondary | ICD-10-CM

## 2018-07-12 DIAGNOSIS — M62838 Other muscle spasm: Secondary | ICD-10-CM | POA: Diagnosis present

## 2018-07-12 DIAGNOSIS — M5442 Lumbago with sciatica, left side: Secondary | ICD-10-CM | POA: Diagnosis not present

## 2018-07-12 DIAGNOSIS — Z79899 Other long term (current) drug therapy: Secondary | ICD-10-CM

## 2018-07-12 DIAGNOSIS — N182 Chronic kidney disease, stage 2 (mild): Secondary | ICD-10-CM | POA: Diagnosis present

## 2018-07-12 DIAGNOSIS — R200 Anesthesia of skin: Secondary | ICD-10-CM | POA: Diagnosis present

## 2018-07-12 DIAGNOSIS — E785 Hyperlipidemia, unspecified: Secondary | ICD-10-CM | POA: Diagnosis present

## 2018-07-12 DIAGNOSIS — W19XXXA Unspecified fall, initial encounter: Secondary | ICD-10-CM

## 2018-07-12 DIAGNOSIS — T380X5A Adverse effect of glucocorticoids and synthetic analogues, initial encounter: Secondary | ICD-10-CM | POA: Diagnosis present

## 2018-07-12 DIAGNOSIS — M5441 Lumbago with sciatica, right side: Secondary | ICD-10-CM

## 2018-07-12 DIAGNOSIS — Z880 Allergy status to penicillin: Secondary | ICD-10-CM

## 2018-07-12 DIAGNOSIS — Z801 Family history of malignant neoplasm of trachea, bronchus and lung: Secondary | ICD-10-CM

## 2018-07-12 DIAGNOSIS — K5900 Constipation, unspecified: Secondary | ICD-10-CM | POA: Diagnosis present

## 2018-07-12 DIAGNOSIS — I10 Essential (primary) hypertension: Secondary | ICD-10-CM | POA: Diagnosis present

## 2018-07-12 DIAGNOSIS — F411 Generalized anxiety disorder: Secondary | ICD-10-CM | POA: Diagnosis present

## 2018-07-12 DIAGNOSIS — D72829 Elevated white blood cell count, unspecified: Secondary | ICD-10-CM | POA: Diagnosis present

## 2018-07-12 DIAGNOSIS — M48061 Spinal stenosis, lumbar region without neurogenic claudication: Secondary | ICD-10-CM | POA: Diagnosis present

## 2018-07-12 DIAGNOSIS — Z803 Family history of malignant neoplasm of breast: Secondary | ICD-10-CM

## 2018-07-12 DIAGNOSIS — I129 Hypertensive chronic kidney disease with stage 1 through stage 4 chronic kidney disease, or unspecified chronic kidney disease: Secondary | ICD-10-CM | POA: Diagnosis present

## 2018-07-12 DIAGNOSIS — Z800 Family history of malignant neoplasm of digestive organs: Secondary | ICD-10-CM

## 2018-07-12 DIAGNOSIS — Z807 Family history of other malignant neoplasms of lymphoid, hematopoietic and related tissues: Secondary | ICD-10-CM

## 2018-07-12 DIAGNOSIS — D638 Anemia in other chronic diseases classified elsewhere: Secondary | ICD-10-CM | POA: Diagnosis present

## 2018-07-12 HISTORY — DX: Dyspnea, unspecified: R06.00

## 2018-07-12 LAB — URINALYSIS, ROUTINE W REFLEX MICROSCOPIC
Bilirubin Urine: NEGATIVE
Glucose, UA: NEGATIVE mg/dL
Hgb urine dipstick: NEGATIVE
Ketones, ur: NEGATIVE mg/dL
LEUKOCYTES UA: NEGATIVE
Nitrite: NEGATIVE
PROTEIN: NEGATIVE mg/dL
SPECIFIC GRAVITY, URINE: 1.005 (ref 1.005–1.030)
pH: 7 (ref 5.0–8.0)

## 2018-07-12 LAB — BASIC METABOLIC PANEL
ANION GAP: 5 (ref 5–15)
BUN: 9 mg/dL (ref 6–20)
CHLORIDE: 108 mmol/L (ref 98–111)
CO2: 28 mmol/L (ref 22–32)
Calcium: 8 mg/dL — ABNORMAL LOW (ref 8.9–10.3)
Creatinine, Ser: 0.83 mg/dL (ref 0.44–1.00)
GFR calc Af Amer: >60 mL/min (ref >60)
Glucose, Bld: 75 mg/dL (ref 70–99)
POTASSIUM: 3.4 mmol/L — AB (ref 3.5–5.1)
SODIUM: 141 mmol/L (ref 135–145)

## 2018-07-12 LAB — CBC WITH DIFFERENTIAL/PLATELET
ABS IMMATURE GRANULOCYTES: 0 10*3/uL (ref 0.0–0.1)
BASOS PCT: 1 %
Basophils Absolute: 0 10*3/uL (ref 0.0–0.1)
Eosinophils Absolute: 0 10*3/uL (ref 0.0–0.7)
Eosinophils Relative: 1 %
HCT: 35.7 % — ABNORMAL LOW (ref 36.0–46.0)
HEMOGLOBIN: 11.3 g/dL — AB (ref 12.0–15.0)
IMMATURE GRANULOCYTES: 0 %
LYMPHS ABS: 2.2 10*3/uL (ref 0.7–4.0)
LYMPHS PCT: 45 %
MCH: 28.9 pg (ref 26.0–34.0)
MCHC: 31.7 g/dL (ref 30.0–36.0)
MCV: 91.3 fL (ref 78.0–100.0)
Monocytes Absolute: 0.4 10*3/uL (ref 0.1–1.0)
Monocytes Relative: 8 %
NEUTROS ABS: 2.2 10*3/uL (ref 1.7–7.7)
NEUTROS PCT: 45 %
PLATELETS: 238 10*3/uL (ref 150–400)
RBC: 3.91 MIL/uL (ref 3.87–5.11)
RDW: 13.2 % (ref 11.5–15.5)
WBC: 4.9 10*3/uL (ref 4.0–10.5)

## 2018-07-12 MED ORDER — ENOXAPARIN SODIUM 40 MG/0.4ML ~~LOC~~ SOLN
40.0000 mg | SUBCUTANEOUS | Status: DC
  Administered 2018-07-12 – 2018-07-18 (×7): 40 mg via SUBCUTANEOUS
  Filled 2018-07-12 (×7): qty 0.4

## 2018-07-12 MED ORDER — OXYCODONE-ACETAMINOPHEN 5-325 MG PO TABS
2.0000 | ORAL_TABLET | Freq: Once | ORAL | Status: AC
  Administered 2018-07-12: 2 via ORAL
  Filled 2018-07-12: qty 2

## 2018-07-12 MED ORDER — ACETAMINOPHEN 325 MG PO TABS: 325.0000 mg | ORAL_TABLET | ORAL | Status: DC | PRN

## 2018-07-12 MED ORDER — LORAZEPAM 0.5 MG PO TABS
0.2500 mg | ORAL_TABLET | Freq: Every day | ORAL | Status: DC | PRN
  Administered 2018-07-13 – 2018-07-18 (×3): 0.5 mg via ORAL
  Filled 2018-07-12 (×4): qty 1

## 2018-07-12 MED ORDER — FENTANYL CITRATE (PF) 100 MCG/2ML IJ SOLN
50.0000 ug | INTRAMUSCULAR | Status: DC | PRN
  Administered 2018-07-12: 50 ug via INTRAVENOUS
  Filled 2018-07-12 (×2): qty 2

## 2018-07-12 MED ORDER — MORPHINE SULFATE (PF) 4 MG/ML IV SOLN
4.0000 mg | Freq: Once | INTRAVENOUS | Status: AC
  Administered 2018-07-12: 4 mg via INTRAVENOUS
  Filled 2018-07-12: qty 1

## 2018-07-12 MED ORDER — GABAPENTIN 300 MG PO CAPS
300.0000 mg | ORAL_CAPSULE | Freq: Three times a day (TID) | ORAL | Status: DC
  Administered 2018-07-12 – 2018-07-20 (×25): 300 mg via ORAL
  Filled 2018-07-12 (×25): qty 1

## 2018-07-12 MED ORDER — LORAZEPAM 2 MG/ML IJ SOLN
0.5000 mg | Freq: Once | INTRAMUSCULAR | Status: AC
  Administered 2018-07-12: 0.5 mg via INTRAVENOUS
  Filled 2018-07-12: qty 1

## 2018-07-12 MED ORDER — DIAZEPAM 5 MG PO TABS
5.0000 mg | ORAL_TABLET | Freq: Once | ORAL | Status: AC
  Administered 2018-07-12: 5 mg via ORAL
  Filled 2018-07-12: qty 1

## 2018-07-12 MED ORDER — DOCUSATE SODIUM 100 MG PO CAPS
100.0000 mg | ORAL_CAPSULE | Freq: Two times a day (BID) | ORAL | Status: DC | PRN
  Administered 2018-07-13: 100 mg via ORAL
  Filled 2018-07-12: qty 1

## 2018-07-12 MED ORDER — EPINEPHRINE 0.3 MG/0.3ML IJ SOAJ: 0.3000 mg | Freq: Once | INTRAMUSCULAR | Status: DC

## 2018-07-12 MED ORDER — METHOCARBAMOL 1000 MG/10ML IJ SOLN
500.0000 mg | Freq: Three times a day (TID) | INTRAVENOUS | Status: DC
  Administered 2018-07-12 – 2018-07-15 (×8): 500 mg via INTRAVENOUS
  Filled 2018-07-12 (×15): qty 5

## 2018-07-12 MED ORDER — OXYCODONE-ACETAMINOPHEN 5-325 MG PO TABS
1.0000 | ORAL_TABLET | ORAL | Status: DC | PRN
  Administered 2018-07-12: 2 via ORAL
  Administered 2018-07-13: 1 via ORAL
  Filled 2018-07-12: qty 1
  Filled 2018-07-12: qty 2

## 2018-07-12 MED ORDER — ONDANSETRON HCL 4 MG/2ML IJ SOLN
4.0000 mg | Freq: Four times a day (QID) | INTRAMUSCULAR | Status: DC | PRN
  Administered 2018-07-12 – 2018-07-20 (×6): 4 mg via INTRAVENOUS
  Filled 2018-07-12 (×7): qty 2

## 2018-07-12 MED ORDER — TRAMADOL HCL 50 MG PO TABS
50.0000 mg | ORAL_TABLET | Freq: Four times a day (QID) | ORAL | Status: DC | PRN
  Administered 2018-07-12: 50 mg via ORAL
  Filled 2018-07-12: qty 1

## 2018-07-12 MED ORDER — CARVEDILOL 6.25 MG PO TABS
6.2500 mg | ORAL_TABLET | Freq: Two times a day (BID) | ORAL | Status: DC
  Administered 2018-07-12 – 2018-07-20 (×16): 6.25 mg via ORAL
  Filled 2018-07-12 (×14): qty 1

## 2018-07-12 MED ORDER — ALPRAZOLAM 0.25 MG PO TABS: 0.2500 mg | ORAL_TABLET | Freq: Every day | ORAL | Status: DC | PRN

## 2018-07-12 MED ORDER — MORPHINE SULFATE (PF) 2 MG/ML IV SOLN
1.0000 mg | INTRAVENOUS | Status: DC | PRN
  Administered 2018-07-12 – 2018-07-13 (×2): 2 mg via INTRAVENOUS
  Filled 2018-07-12 (×2): qty 1

## 2018-07-12 MED ORDER — ATORVASTATIN CALCIUM 20 MG PO TABS
20.0000 mg | ORAL_TABLET | Freq: Every day | ORAL | Status: DC
  Administered 2018-07-13 – 2018-07-20 (×8): 20 mg via ORAL
  Filled 2018-07-12 (×8): qty 1

## 2018-07-12 NOTE — ED Triage Notes (Signed)
Pt was working 15 minutes ago and fell out of a rolling chair onto her back. Pt complains of extreme lower back pain now. Had recent back procedure. Denies hitting head or loc, not on blood thinners.

## 2018-07-12 NOTE — ED Notes (Signed)
Patient transported to CT 

## 2018-07-12 NOTE — ED Provider Notes (Signed)
[facility] EMERGENCY DEPARTMENT Provider Note   CSN: [id number] Arrival date & time: 07/12/18  1248     History   Chief Complaint Chief Complaint  Patient presents with  . Fall  . Back Pain    HPI [name] is a 57 y.o. female.  She she has a prior history of back surgery in May.  She continues to have right foot numbness and is in physical therapy.  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  No LOC.  She is complaining of severe low back pain 10 out of 10 with radiation down both of her legs.  There is no incontinence of urine or stool.  She also has pain in her thoracic back but no cervical tenderness.  No chest pain or shortness of breath.  The history is provided by the patient.  Fall  This is a new problem. The current episode started less than 1 hour ago. The problem occurs constantly. The problem has not changed since onset.Pertinent negatives include no chest pain, no abdominal pain, no headaches and no shortness of breath. The symptoms are aggravated by bending and twisting. Nothing relieves the symptoms. She has tried nothing for the symptoms. The treatment provided no relief.  Back Pain   This is a new problem. The current episode started less than 1 hour ago. The problem occurs constantly. The pain is associated with falling. The pain is present in the thoracic spine and lumbar spine. The quality of the pain is described as stabbing. The pain radiates to the left thigh, left knee, left foot, right thigh, right knee and right foot. The pain is at a severity of 10/10. The symptoms are aggravated by bending, twisting and certain positions. Pertinent negatives include no chest pain, no fever, no headaches, no abdominal pain and no dysuria. She has tried nothing for the symptoms. The treatment provided no relief.    Past Medical History:  Diagnosis Date  .  Anxiety   . Back pain    related to spinal stenosis and disc problem, radiates down left buttocks to leg., weakness occ.  [name] Headache   . Hyperlipidemia   . Hypertension   . PONV (postoperative nausea and vomiting)   . Vaginal foreign object    "Uses Femring"    Patient Active Problem List   Diagnosis Date Noted  . Hyperlipidemia 04/16/2018  . Generalized anxiety disorder with panic attacks 04/16/2018  . AKI (acute kidney injury) ([place])   . Benign essential HTN   . Depression with anxiety   . Lumbar radiculopathy 03/09/2018  . Paraparesis ([place]) 03/09/2018  . Essential hypertension   . Chronic nonintractable headache   . Leukocytosis   . Acute blood loss anemia   . Postoperative pain   . Generalized weakness   . Spinal stenosis at L4-L5 level 03/04/2018  . Spinal stenosis of lumbar region 12/28/2015  . Chest pain 04/16/2015    Past Surgical History:  Procedure Laterality Date  . ABDOMINAL HYSTERECTOMY    . CARDIAC CATHETERIZATION N/A 04/18/2015   Procedure: Left Heart Cath and Coronary Angiography;  Surgeon: [name], MD;  Location: [facility];  Service: Cardiovascular;  Laterality: N/A;  . FOOT SURGERY Bilateral    [address] "bunion,bone spur, tendon" (1) -6'16, (1)-10'16  . LUMBAR LAMINECTOMY/DECOMPRESSION MICRODISCECTOMY Bilateral 12/28/2015   Procedure: MICRO LUMBAR DECOMPRESSION L4 -  L5 BILATERALLY;  Surgeon: [name], MD;  Location: [facility];  Service: Orthopedics;  Laterality: Bilateral;  . LUMBAR LAMINECTOMY/DECOMPRESSION MICRODISCECTOMY Bilateral 03/04/2018   Procedure: Revision of Microlumbar Decompression Bilateral Lumbar Four-Five;  Surgeon: [name], MD;  Location: [facility];  Service: Orthopedics;  Laterality: Bilateral;  90 mins  . WISDOM TOOTH EXTRACTION    . WOUND EXPLORATION N/A 03/04/2018   Procedure: EXPLORATION OF LUMBAR DECOMPRESSION WOUND;  Surgeon: [name], MD;  Location: [facility];  Service: Orthopedics;  Laterality: N/A;      OB History   None      Home Medications    Prior to Admission medications   Medication Sig Start Date End Date Taking? Authorizing Provider  acetaminophen (TYLENOL) 325 MG tablet Take 1-2 tablets (325-650 mg total) by mouth every 4 (four) hours as needed for mild pain. 03/19/18   Love, Ivan Anchors, PA-C  acidophilus (RISAQUAD) CAPS capsule Take 1 capsule by mouth daily. 03/20/18   Love, Ivan Anchors, PA-C  ALPRAZolam (XANAX) 0.25 MG tablet Take 1 tablet (0.25 mg total) by mouth daily as needed for anxiety or sleep. Patient not taking: Reported on 07/06/2018 03/19/18   Love, Ivan Anchors, PA-C  atorvastatin (LIPITOR) 20 MG tablet Take 1 tablet (20 mg total) by mouth daily. 04/27/18   [name], [name], MD  carvedilol (COREG) 6.25 MG tablet TAKE 1 TABLET BY MOUTH 2 TIMES DAILY. 05/24/18   [name], [name], MD  docusate sodium (COLACE) 100 MG capsule Take 1 capsule (100 mg total) by mouth 2 (two) times daily. 03/19/18   Love, Ivan Anchors, PA-C  EPINEPHrine 0.3 mg/0.3 mL IJ SOAJ injection epinephrine 0.3 mg/0.3 mL injection, auto-injector   0.3 mg by injection route. 11/13/17   [provider]  gabapentin (NEURONTIN) 300 MG capsule Take 1 capsule (300 mg total) by mouth 3 (three) times daily. 05/03/18   Meredith Staggers, MD  LORazepam (ATIVAN) 0.5 MG tablet Take 0.5-1 tablets (0.25-0.5 mg total) by mouth daily as needed for anxiety. 04/27/18   [name], [name], MD  methocarbamol (ROBAXIN) 500 MG tablet Take 1 tablet (500 mg total) by mouth every 6 (six) hours as needed for muscle spasms. 05/03/18   Meredith Staggers, MD  polyethylene glycol [name] / Floria Raveling) packet Take 17 g by mouth daily. 03/20/18   Love, Ivan Anchors, PA-C  PREMARIN 0.625 MG tablet Take 0.625 mg by mouth daily. 12/09/17   [provider]  traMADol (ULTRAM) 50 MG tablet Take 1 tablet (50 mg total) by mouth every 6 (six) hours as needed for moderate pain. 07/05/18   Meredith Staggers, MD    Family History Family History  Problem  Relation Age of Onset  . Heart attack Mother   . Lung cancer Father   . Cancer Father   . Pancreatic cancer Sister   . Breast cancer Sister 30  . Throat cancer Brother   . Multiple myeloma Sister   . Breast cancer Sister        diagnosed in her 40's  . Heart attack Sister   . Stomach cancer Cousin   . Colon cancer Neg Hx     Social History Social History   Tobacco Use  . Smoking status: Never Smoker  . Smokeless tobacco: Never Used  Substance Use Topics  . Alcohol use: No  . Drug use: No     Allergies   Cephalosporins; Penicillins; Anesthetics, amide; Peach [prunus persica]; and Latex   Review of Systems Review of Systems  Constitutional: Negative for fever.  HENT: Negative for sore throat.   Eyes: Negative for visual disturbance.  Respiratory: Negative for shortness of breath.   Cardiovascular: Negative for chest pain.  Gastrointestinal: Negative for abdominal pain.  Genitourinary: Negative for dysuria.  Musculoskeletal: Positive for back pain.  Skin: Negative for rash.  Neurological: Negative for headaches.     Physical Exam Updated Vital Signs BP (!) 178/112 (BP Location: Right Arm)   Pulse 96   Temp 97.9 F (36.6 C) (Oral)   Resp 16   Ht '5\' 5"'$  (1.651 m)   Wt 66.7 kg   SpO2 100%   BMI 24.46 kg/m   Physical Exam  Constitutional: She appears well-developed and well-nourished. No distress.  HENT:  Head: Normocephalic and atraumatic.  Mouth/Throat: Oropharynx is clear and moist.  Eyes: Conjunctivae are normal.  Neck: Neck supple.  Cardiovascular: Normal rate, regular rhythm and intact distal pulses.  No murmur heard. Pulmonary/Chest: Effort normal and breath sounds normal. No stridor. No respiratory distress. She has no wheezes.  Abdominal: Soft. There is no tenderness.  Musculoskeletal: Normal range of motion. She exhibits no edema, tenderness or deformity.  She has diffuse tenderness from her mid thoracic to her coccyx.  She also has paraSpinal  tenderness.  She is moving all 4 extremities but her strength is difficult to assess secondary to her back pain.  Sensation is intact all 4 extremities except to her right foot which is baseline prior to the fall.  Unable to take gait test secondary to her pain.  Neurological: She is alert. GCS eye subscore is 4. GCS verbal subscore is 5. GCS motor subscore is 6.  Skin: Skin is warm and dry.  Psychiatric: She has a normal mood and affect.  Nursing note and vitals reviewed.    ED Treatments / Results  Labs (all labs ordered are listed, but only abnormal results are displayed) Labs Reviewed  CBC WITH DIFFERENTIAL/PLATELET - Abnormal; Notable for the following components:      Result Value   Hemoglobin 11.3 (*)    HCT 35.7 (*)    All other components within normal limits  BASIC METABOLIC PANEL - Abnormal; Notable for the following components:   Potassium 3.4 (*)    Calcium 8.0 (*)    All other components within normal limits  URINALYSIS, ROUTINE W REFLEX MICROSCOPIC - Abnormal; Notable for the following components:   Color, Urine STRAW (*)    All other components within normal limits  HIV ANTIBODY (ROUTINE TESTING W REFLEX)    EKG None  Radiology Ct Thoracic Spine Wo Contrast  Result Date: 07/12/2018 CLINICAL DATA:  Fall.  Back pain.  History of lumbar surgery. EXAM: CT THORACIC AND LUMBAR SPINE WITHOUT CONTRAST TECHNIQUE: Multidetector CT imaging of the thoracic and lumbar spine was performed without contrast. Multiplanar CT image reconstructions were also generated. COMPARISON:  None. FINDINGS: CT THORACIC SPINE FINDINGS Alignment: Normal. Vertebrae: No acute fracture or focal pathologic process. Paraspinal and other soft tissues: Negative. Disc levels: No visible disc herniation or spinal canal stenosis. CT LUMBAR SPINE FINDINGS Segmentation: Standard Alignment: Root normal Vertebrae: No acute fracture or focal pathologic process. Paraspinal and other soft tissues: Mild calcific  aortic atherosclerosis. Disc levels: There is no traumatic disc herniation, spinal canal stenosis or nerve root impingement. The patient has undergone bilateral decompressive laminectomies at the L4-5 level. There is moderate left facet hypertrophy at L3-4. IMPRESSION: CT THORACIC SPINE IMPRESSION No acute abnormality or evidence of spinal canal or neural foraminal  stenosis. CT LUMBAR SPINE IMPRESSION Postsurgical changes of bilateral L4 laminectomies. No acute abnormality, spinal canal stenosis or nerve root impingement. Electronically Signed   By: Ulyses Jarred M.D.   On: 07/12/2018 14:44   Ct Lumbar Spine Wo Contrast  Result Date: 07/12/2018 CLINICAL DATA:  Fall.  Back pain.  History of lumbar surgery. EXAM: CT THORACIC AND LUMBAR SPINE WITHOUT CONTRAST TECHNIQUE: Multidetector CT imaging of the thoracic and lumbar spine was performed without contrast. Multiplanar CT image reconstructions were also generated. COMPARISON:  None. FINDINGS: CT THORACIC SPINE FINDINGS Alignment: Normal. Vertebrae: No acute fracture or focal pathologic process. Paraspinal and other soft tissues: Negative. Disc levels: No visible disc herniation or spinal canal stenosis. CT LUMBAR SPINE FINDINGS Segmentation: Standard Alignment: Root normal Vertebrae: No acute fracture or focal pathologic process. Paraspinal and other soft tissues: Mild calcific aortic atherosclerosis. Disc levels: There is no traumatic disc herniation, spinal canal stenosis or nerve root impingement. The patient has undergone bilateral decompressive laminectomies at the L4-5 level. There is moderate left facet hypertrophy at L3-4. IMPRESSION: CT THORACIC SPINE IMPRESSION No acute abnormality or evidence of spinal canal or neural foraminal stenosis. CT LUMBAR SPINE IMPRESSION Postsurgical changes of bilateral L4 laminectomies. No acute abnormality, spinal canal stenosis or nerve root impingement. Electronically Signed   By: Ulyses Jarred M.D.   On: 07/12/2018 14:44     Procedures Procedures (including critical care time)  Medications Ordered in ED Medications  fentaNYL (SUBLIMAZE) injection 50 mcg (50 mcg Intravenous Given 07/12/18 1258)  morphine 4 MG/ML injection 4 mg (has no administration in time range)     Initial Impression / Assessment and Plan / ED Course  I have reviewed the triage vital signs and the nursing notes.  Pertinent labs & imaging results that were available during my care of the patient were reviewed by me and considered in my medical decision making (see chart for details).  Clinical Course as of Jul 13 813  Mon Jul 12, 2018  1513 CT imaging of her thoracic and lumbar spine shows no acute fractures or hematoma.  I reviewed this with the patient and she has been to call her surgeon Dr. Maxie Better.  I also reviewed this with him and he felt the patient could follow-up with him tomorrow in the office if we can get adequate pain control.   [MB]  1601 Patient states she talked to [name] and he told her to tell us that she should be observed overnight for pain control and blood pressure monitoring.   [MB]  6213 Discussed with Dr. Karleen Hampshire from the hospitalist service who will evaluate the patient in the ED for evaluation and observation.   [MB]    Clinical Course User Index [MB] Hayden Rasmussen, MD     Final Clinical Impressions(s) / ED Diagnoses   Final diagnoses:  Acute bilateral low back pain with bilateral sciatica  Fall, initial encounter    ED Discharge Orders    None       Hayden Rasmussen, MD 07/13/18 [phone]

## 2018-07-12 NOTE — H&P (Signed)
History and Physical    [name] MRN:[id number] DOB: 05/12/1961 DOA: 07/12/2018  PCP: Jordan, [name], MD  Patient coming from: Home  I have personally briefly reviewed patient's old medical records in [facility]  Chief Complaint: fall with back pain   HPI: [name] is a 57 y.o. female with medical history significant of back surgery in may 2019, hypertensin,  Hyperlipidemia, anxiety, was at work today and was trying to sit on a rolling wheelchair, when it slit out from underneath her and she fell on her back . She did not lose her consciousness, ws not dizzy prior to the fall. Pt reports she couldn't get up from the fall and has severe back pain radiating to the legs, more on the left side than on the right side. She denies any fevers or chills, headache, sob, chest pain, cough, palpitations, dizziness, syncope, or sweating. She reports some nausea from severe pain, but no vomiting or abdominal pain. No diarrhea or constipation or dysuria. She reports some tingling in the left leg. Any movement of the back  And legs are making her back pain worse.  ED Course: she was seen in ED and underwent CT of the lumbar and thoracic spine, does not show any fractures. It shows Postsurgical changes of bilateral L4 laminectomies. No acute abnormality, spinal canal stenosis or nerve root impingement. Labs are pending at this time.   Review of Systems: As per HPI otherwise 10 point review of systems negative.    Past Medical History:  Diagnosis Date  . Anxiety   . Back pain    related to spinal stenosis and disc problem, radiates down left buttocks to leg., weakness occ.  . Dyspnea   . Headache   . Hyperlipidemia   . Hypertension   . PONV (postoperative nausea and vomiting)   . Vaginal foreign object    "Uses Femring"    Past Surgical History:  Procedure Laterality Date  . ABDOMINAL HYSTERECTOMY    . CARDIAC CATHETERIZATION N/A 04/18/2015   Procedure: Left Heart Cath  and Coronary Angiography;  Surgeon: Mohan Harwani, MD;  Location: [facility] INVASIVE [facility];  Service: Cardiovascular;  Laterality: N/A;  . FOOT SURGERY Bilateral    [facility] "bunion,bone spur, tendon" (1) -6'16, (1)-10'16  . LUMBAR LAMINECTOMY/DECOMPRESSION MICRODISCECTOMY Bilateral 12/28/2015   Procedure: MICRO LUMBAR DECOMPRESSION L4 - L5 BILATERALLY;  Surgeon: Jeffrey Beane, MD;  Location: [facility] ORS;  Service: Orthopedics;  Laterality: Bilateral;  . LUMBAR LAMINECTOMY/DECOMPRESSION MICRODISCECTOMY Bilateral 03/04/2018   Procedure: Revision of Microlumbar Decompression Bilateral Lumbar Four-Five;  Surgeon: Beane, Jeffrey, MD;  Location: [facility] OR;  Service: Orthopedics;  Laterality: Bilateral;  90 mins  . WISDOM TOOTH EXTRACTION    . WOUND EXPLORATION N/A 03/04/2018   Procedure: EXPLORATION OF LUMBAR DECOMPRESSION WOUND;  Surgeon: Beane, Jeffrey, MD;  Location: [facility] OR;  Service: Orthopedics;  Laterality: N/A;     reports that she has never smoked. She has never used smokeless tobacco. She reports that she does not drink alcohol or use drugs.  Allergies  Allergen Reactions  . Cephalosporins Anaphylaxis  . Penicillins Anaphylaxis and Hives    Has patient had a PCN reaction causing immediate rash, facial/tongue/throat swelling, SOB or lightheadedness with hypotension: Yes Has patient had a PCN reaction causing severe rash involving mucus membranes or skin necrosis: Yes Has patient had a PCN reaction that required hospitalization Yes Has patient had a PCN reaction occurring within the last 10 years: No If all of the   above answers are "NO", then may proceed with Cephalosporin use.   . Anesthetics, Amide Nausea And Vomiting    Does not know name of it. States they put [facility] on [facility].   [name] Peach [Prunus Persica] Hives  . Latex Hives, Itching and Rash    Family History  Problem Relation Age of Onset  . Heart attack Mother   . Lung cancer Father   . Cancer Father   . Pancreatic cancer  Sister   . Breast cancer Sister 67  . Throat cancer Brother   . Multiple myeloma Sister   . Breast cancer Sister        diagnosed in her 11's  . Heart attack Sister   . Stomach cancer Cousin   . Colon cancer Neg Hx      Family history reviewed and not pertinent  Prior to Admission medications   Medication Sig Start Date End Date Taking? Authorizing Provider  acetaminophen (TYLENOL) 325 MG tablet Take 1-2 tablets (325-650 mg total) by mouth every 4 (four) hours as needed for mild pain. 03/19/18  Yes Love, Ivan Anchors, PA-C  acidophilus (RISAQUAD) CAPS capsule Take 1 capsule by mouth daily. 03/20/18  Yes Love, Ivan Anchors, PA-C  ALPRAZolam (XANAX) 0.25 MG tablet Take 1 tablet (0.25 mg total) by mouth daily as needed for anxiety or sleep. 03/19/18  Yes Love, Ivan Anchors, PA-C  atorvastatin (LIPITOR) 20 MG tablet Take 1 tablet (20 mg total) by mouth daily. 04/27/18  Yes [name], [name], MD  carvedilol (COREG) 6.25 MG tablet TAKE 1 TABLET BY MOUTH 2 TIMES DAILY. Patient taking differently: Take 6.25 mg by mouth 2 (two) times daily with a meal.  05/24/18  Yes [name], [name], MD  docusate sodium (COLACE) 100 MG capsule Take 1 capsule (100 mg total) by mouth 2 (two) times daily. Patient taking differently: Take 100 mg by mouth 2 (two) times daily as needed for mild constipation.  03/19/18  Yes Love, Ivan Anchors, PA-C  EPINEPHrine 0.3 mg/0.3 mL IJ SOAJ injection Inject 0.3 mg into the muscle once. For severe allergic reaction 11/13/17  Yes [provider]  gabapentin (NEURONTIN) 300 MG capsule Take 1 capsule (300 mg total) by mouth 3 (three) times daily. 05/03/18  Yes Meredith Staggers, MD  LORazepam (ATIVAN) 0.5 MG tablet Take 0.5-1 tablets (0.25-0.5 mg total) by mouth daily as needed for anxiety. 04/27/18  Yes [name], [name], MD  methocarbamol (ROBAXIN) 500 MG tablet Take 1 tablet (500 mg total) by mouth every 6 (six) hours as needed for muscle spasms. 05/03/18  Yes Meredith Staggers, MD  polyethylene glycol  [facility] Floria Raveling) packet Take 17 g by mouth daily. 03/20/18  Yes Love, Ivan Anchors, PA-C  PREMARIN 0.625 MG tablet Take 0.625 mg by mouth daily. 12/09/17  Yes [provider]  traMADol (ULTRAM) 50 MG tablet Take 1 tablet (50 mg total) by mouth every 6 (six) hours as needed for moderate pain. 07/05/18  Yes Meredith Staggers, MD    Physical Exam: Vitals:   07/12/18 1251 07/12/18 1315 07/12/18 1545 07/12/18 1727  BP:  124/85 (!) 142/86   Pulse:   70   Resp:      Temp:      TempSrc:      SpO2:   98%   Weight: 66.7 kg   66.7 kg  Height: 5' 5" (1.651 m)   5' 5" (1.651 m)    Constitutional: NAD, calm, comfortable Vitals:   07/12/18  1251 07/12/18 1315 07/12/18 1545 07/12/18 1727  BP:  124/85 (!) 142/86   Pulse:   70   Resp:      Temp:      TempSrc:      SpO2:   98%   Weight: 66.7 kg   66.7 kg  Height: 5' 5" (1.651 m)   5' 5" (1.651 m)   Eyes: PERRL, lids and conjunctivae normal ENMT: Mucous membranes are moist.  Neck: normal, supple, no masses, no thyromegaly Respiratory: clear to auscultation bilaterally, no wheezing, no crackles. Normal respiratory effort. No accessory muscle use.  Cardiovascular: Regular rate and rhythm, no murmurs / rubs / gallops. No extremity edema.  No carotid bruits.  Abdomen: no tenderness, no masses palpated. No hepatosplenomegaly. Bowel sounds positive.  Musculoskeletal: no clubbing / cyanosis. No pedal edema.  Skin: no rashes, lesions, ulcers. No induration Neurologic: CN 2-12 grossly intact. Sensation intact, strength 5/5 in both upper and lower extremities. Some tingling of the left lowr extremity.  Psychiatric: Normal judgment and insight. Alert and oriented x 3. Normal mood.    Labs on Admission: I have personally reviewed following labs and imaging studies  CBC: No results for input(s): WBC, NEUTROABS, HGB, HCT, MCV, PLT in the last 168 hours. Basic Metabolic Panel: No results for input(s): NA, K, CL, CO2, GLUCOSE, BUN, CREATININE,  CALCIUM, MG, PHOS in the last 168 hours. GFR: CrCl cannot be calculated (Patient's most recent lab result is older than the maximum 21 days allowed.). Liver Function Tests: No results for input(s): AST, ALT, ALKPHOS, BILITOT, PROT, ALBUMIN in the last 168 hours. No results for input(s): LIPASE, AMYLASE in the last 168 hours. No results for input(s): AMMONIA in the last 168 hours. Coagulation Profile: No results for input(s): INR, PROTIME in the last 168 hours. Cardiac Enzymes: No results for input(s): CKTOTAL, CKMB, CKMBINDEX, TROPONINI in the last 168 hours. BNP (last 3 results) No results for input(s): PROBNP in the last 8760 hours. HbA1C: No results for input(s): HGBA1C in the last 72 hours. CBG: No results for input(s): GLUCAP in the last 168 hours. Lipid Profile: No results for input(s): CHOL, HDL, LDLCALC, TRIG, CHOLHDL, LDLDIRECT in the last 72 hours. Thyroid Function Tests: No results for input(s): TSH, T4TOTAL, FREET4, T3FREE, THYROIDAB in the last 72 hours. Anemia Panel: No results for input(s): VITAMINB12, FOLATE, FERRITIN, TIBC, IRON, RETICCTPCT in the last 72 hours. Urine analysis:    Component Value Date/Time   COLORURINE STRAW (A) 07/12/2018 1709   APPEARANCEUR CLEAR 07/12/2018 1709   LABSPEC 1.005 07/12/2018 1709   PHURINE 7.0 07/12/2018 1709   GLUCOSEU NEGATIVE 07/12/2018 1709   HGBUR NEGATIVE 07/12/2018 1709   BILIRUBINUR NEGATIVE 07/12/2018 1709   KETONESUR NEGATIVE 07/12/2018 1709   PROTEINUR NEGATIVE 07/12/2018 1709   UROBILINOGEN 0.2 04/24/2009 1600   NITRITE NEGATIVE 07/12/2018 1709   LEUKOCYTESUR NEGATIVE 07/12/2018 1709    Radiological Exams on Admission: Ct Thoracic Spine Wo Contrast  Result Date: 07/12/2018 CLINICAL DATA:  Fall.  Back pain.  History of lumbar surgery. EXAM: CT THORACIC AND LUMBAR SPINE WITHOUT CONTRAST TECHNIQUE: Multidetector CT imaging of the thoracic and lumbar spine was performed without contrast. Multiplanar CT image  reconstructions were also generated. COMPARISON:  None. FINDINGS: CT THORACIC SPINE FINDINGS Alignment: Normal. Vertebrae: No acute fracture or focal pathologic process. Paraspinal and other soft tissues: Negative. Disc levels: No visible disc herniation or spinal canal stenosis. CT LUMBAR SPINE FINDINGS Segmentation: Standard Alignment: Root normal Vertebrae: No acute fracture or focal pathologic   process. Paraspinal and other soft tissues: Mild calcific aortic atherosclerosis. Disc levels: There is no traumatic disc herniation, spinal canal stenosis or nerve root impingement. The patient has undergone bilateral decompressive laminectomies at the L4-5 level. There is moderate left facet hypertrophy at L3-4. IMPRESSION: CT THORACIC SPINE IMPRESSION No acute abnormality or evidence of spinal canal or neural foraminal stenosis. CT LUMBAR SPINE IMPRESSION Postsurgical changes of bilateral L4 laminectomies. No acute abnormality, spinal canal stenosis or nerve root impingement. Electronically Signed   By: Kevin  Herman M.D.   On: 07/12/2018 14:44   Ct Lumbar Spine Wo Contrast  Result Date: 07/12/2018 CLINICAL DATA:  Fall.  Back pain.  History of lumbar surgery. EXAM: CT THORACIC AND LUMBAR SPINE WITHOUT CONTRAST TECHNIQUE: Multidetector CT imaging of the thoracic and lumbar spine was performed without contrast. Multiplanar CT image reconstructions were also generated. COMPARISON:  None. FINDINGS: CT THORACIC SPINE FINDINGS Alignment: Normal. Vertebrae: No acute fracture or focal pathologic process. Paraspinal and other soft tissues: Negative. Disc levels: No visible disc herniation or spinal canal stenosis. CT LUMBAR SPINE FINDINGS Segmentation: Standard Alignment: Root normal Vertebrae: No acute fracture or focal pathologic process. Paraspinal and other soft tissues: Mild calcific aortic atherosclerosis. Disc levels: There is no traumatic disc herniation, spinal canal stenosis or nerve root impingement. The patient  has undergone bilateral decompressive laminectomies at the L4-5 level. There is moderate left facet hypertrophy at L3-4. IMPRESSION: CT THORACIC SPINE IMPRESSION No acute abnormality or evidence of spinal canal or neural foraminal stenosis. CT LUMBAR SPINE IMPRESSION Postsurgical changes of bilateral L4 laminectomies. No acute abnormality, spinal canal stenosis or nerve root impingement. Electronically Signed   By: Kevin  Herman M.D.   On: 07/12/2018 14:44    EKG: not done.   Assessment/Plan Active Problems:   Back pain   Back pain from the fall possibly muscle spasms on th eleft: Admit for observation overnight .  Start her on IV morphine, oral oxycodone every 4 hours as needed.  Gentle hydration.  IV robaxin for muscle spasm.  PT evaluation in am.  If her pain doesn't improve and she continues to have radiculopathy , recommend getting MRI of the lumbar spine.     Hypertension; sub optimal, possibly from pain.  Restart home meds tonight.   Hyperlipidemia:  - resume lipitor.     Anxiety:  Resume xanax.    Mild normocytic anemia. Hemoglobin at 11. Monitor.        DVT prophylaxis: lovenox.  Code Status: full code.  Family Communication: family at bedside.  Disposition Plan: possible d/ in am if pain is controlled.  Consults called: none.  Admission status: obs/ med    Vijaya Akula MD [facility] Pager [phone]  If 7PM-7AM, please contact night-coverage [email or web address] Password [email or web address]  07/12/2018, 5:49 PM    

## 2018-07-12 NOTE — ED Notes (Signed)
Sent culture with specimen

## 2018-07-12 NOTE — ED Notes (Signed)
ED Provider at bedside. 

## 2018-07-13 ENCOUNTER — Telehealth: Payer: Self-pay | Admitting: *Deleted

## 2018-07-13 ENCOUNTER — Ambulatory Visit: Payer: 59

## 2018-07-13 ENCOUNTER — Observation Stay (HOSPITAL_COMMUNITY): Payer: PRIVATE HEALTH INSURANCE

## 2018-07-13 DIAGNOSIS — M545 Low back pain: Secondary | ICD-10-CM

## 2018-07-13 DIAGNOSIS — W19XXXA Unspecified fall, initial encounter: Secondary | ICD-10-CM | POA: Diagnosis not present

## 2018-07-13 DIAGNOSIS — R39198 Other difficulties with micturition: Secondary | ICD-10-CM

## 2018-07-13 DIAGNOSIS — S3992XA Unspecified injury of lower back, initial encounter: Secondary | ICD-10-CM | POA: Diagnosis not present

## 2018-07-13 DIAGNOSIS — I10 Essential (primary) hypertension: Secondary | ICD-10-CM | POA: Diagnosis not present

## 2018-07-13 LAB — HIV ANTIBODY (ROUTINE TESTING W REFLEX): HIV Screen 4th Generation wRfx: NONREACTIVE

## 2018-07-13 MED ORDER — TRAMADOL HCL 50 MG PO TABS
50.0000 mg | ORAL_TABLET | Freq: Two times a day (BID) | ORAL | Status: DC
  Administered 2018-07-13 – 2018-07-20 (×14): 50 mg via ORAL
  Filled 2018-07-13 (×14): qty 1

## 2018-07-13 MED ORDER — ESTROGENS CONJUGATED 0.625 MG PO TABS
0.6250 mg | ORAL_TABLET | Freq: Every day | ORAL | Status: DC
  Administered 2018-07-13 – 2018-07-20 (×8): 0.625 mg via ORAL
  Filled 2018-07-13 (×8): qty 1

## 2018-07-13 MED ORDER — LORAZEPAM 2 MG/ML IJ SOLN
0.5000 mg | Freq: Once | INTRAMUSCULAR | Status: AC
  Administered 2018-07-13: 0.5 mg via INTRAVENOUS

## 2018-07-13 MED ORDER — PREDNISONE 10 MG PO TABS: 10.0000 mg | ORAL_TABLET | Freq: Every day | ORAL | Status: DC

## 2018-07-13 MED ORDER — SODIUM CHLORIDE 0.9 % IV SOLN
INTRAVENOUS | Status: DC | PRN
  Administered 2018-07-13: 250 mL via INTRAVENOUS
  Administered 2018-07-14: 1000 mL via INTRAVENOUS

## 2018-07-13 MED ORDER — MORPHINE SULFATE (PF) 2 MG/ML IV SOLN: 1.0000 mg | INTRAVENOUS | Status: DC | PRN

## 2018-07-13 MED ORDER — GADOBUTROL 1 MMOL/ML IV SOLN
6.0000 mL | Freq: Once | INTRAVENOUS | Status: AC | PRN
  Administered 2018-07-13: 6 mL via INTRAVENOUS

## 2018-07-13 MED ORDER — OXYCODONE-ACETAMINOPHEN 5-325 MG PO TABS
1.0000 | ORAL_TABLET | ORAL | Status: DC | PRN
  Administered 2018-07-14: 1 via ORAL
  Administered 2018-07-14 – 2018-07-17 (×10): 2 via ORAL
  Administered 2018-07-18 – 2018-07-19 (×2): 1 via ORAL
  Administered 2018-07-19 – 2018-07-20 (×3): 2 via ORAL
  Filled 2018-07-13 (×4): qty 2
  Filled 2018-07-13: qty 1
  Filled 2018-07-13 (×6): qty 2
  Filled 2018-07-13: qty 1
  Filled 2018-07-13 (×2): qty 2
  Filled 2018-07-13: qty 1
  Filled 2018-07-13: qty 2

## 2018-07-13 MED ORDER — PREDNISONE 10 MG (21) PO TBPK: 10.0000 mg | ORAL_TABLET | ORAL | Status: DC

## 2018-07-13 MED ORDER — LORAZEPAM 2 MG/ML IJ SOLN
INTRAMUSCULAR | Status: AC
  Administered 2018-07-13: 0.5 mg via INTRAVENOUS
  Filled 2018-07-13: qty 1

## 2018-07-13 MED ORDER — POLYETHYLENE GLYCOL 3350 17 G PO PACK
17.0000 g | PACK | Freq: Every day | ORAL | Status: DC
  Administered 2018-07-13 – 2018-07-16 (×4): 17 g via ORAL
  Filled 2018-07-13 (×4): qty 1

## 2018-07-13 MED ORDER — DEXAMETHASONE SODIUM PHOSPHATE 10 MG/ML IJ SOLN
10.0000 mg | Freq: Once | INTRAMUSCULAR | Status: AC
  Administered 2018-07-13: 10 mg via INTRAVENOUS
  Filled 2018-07-13: qty 1

## 2018-07-13 NOTE — Progress Notes (Addendum)
PROGRESS NOTE    [name]  ZWC:[id number] DOB: 04-Jan-1961 DOA: 07/12/2018 PCP: [name], [name], MD   Brief Narrative:  57 year old woman with a history of hypertension, hyperlipidemia, anxiety, as well as status post L for L5 lumbar decompression in March 2019, and status post bilateral lumbar laminectomy with decompression and microdiscectomy in May 2019, Dr. Rudene Anda, presenting to the emergency department on 07/12/2018 after sustaining a fall while at work. CT of the thoracic and lumbar spine, were negative for fracture, or focal pathologic process.  she was started on IV morphine, oxycodone every 4 hours as needed, getting hydration, and then given IV Robaxin for muscle spasm, with no significant improvement.  She describes the pain is severe, 8 out of 10.  She denies any left lower extremity weakness or numbness, and she does have permanent, postoperative right foot numbness.  Status was complicated this morning, with difficulty initiating urination, "cannot keep a steady stream ".  She denies any dysuria or gross hematuria.  She denies any bowel incontinence.  Last bowel movement was on 07/12/2018.  She denies any saddle anesthesia.  MRI of the lumbar spine is pending.  Case discussed with Dr. Rudene Anda who also recommends a dose of Decadron 10 mg IV, then followed by 10 mg Decadron Dosepak.   Assessment & Plan:   Principal Problem:   Back pain Active Problems:   Difficulty in urination   Benign essential HTN   Hyperlipidemia   Generalized anxiety disorder with panic attacks   Severe back pain, after fall CT of the thoracic and lumbar spine, were negative for fracture, or focal pathologic process.   MRI of the lumbar spine is pending.  Orthopedic to follow on the results, with further recommendation, appreciate involvement  Case discussed with Dr. Rudene Anda who also recommends a dose of Decadron 10 mg IV, then followed by 10 mg Decadron Dosepak. Pain control:  scheduled Ultram 50 mg every 12 hours and Percocet 1 to 2 tablets every 4 hours as needed-- use IV pain medications for breakthrough pain Continue Neurontin -robaxin IV for muscle spasms -bowel regimen  Difficulty with urination, post fall Not consistent with cauda equina.  Bladder scan without residual. MRI of the lumbar spine is pending, will follow results  Hypertension BP  Continue home meds with Coreg  Hyperlipidemia, Continue Lipitor 20 mg daily  Anxiety Continue Valium  Normocytic anemia, hemoglobin at 11 on admission CBC in a.m.     DVT prophylaxis: Lovenox Code Status: Full code  Family Communication: None Disposition Plan: To home when stable  Consultants:    Dr. Rudene Anda, orthopedics   Procedures:  None  Antimicrobials:   None   Subjective: Patient is complaining of significant back pain, 8 out of 10.She denies any left lower extremity weakness or numbness, and she does have permanent, postoperative right foot numbness.  Status was complicated this morning, with difficulty initiating urination, "cannot keep a steady stream ".    Objective: Vitals:   07/12/18 1830 07/12/18 2144 07/13/18 0517 07/13/18 1027  BP: 139/85 137/81 (!) 150/84 (!) 153/93  Pulse: 66 69 73 70  Resp:  15 18   Temp: 97.6 F (36.4 C) 98.2 F (36.8 C) 97.8 F (36.6 C)   TempSrc: Oral Oral Oral   SpO2: 96% 98% 100%   Weight:      Height:        Intake/Output Summary (Last 24 hours) at 07/13/2018 1149 Last data filed at 07/13/2018 0300 Gross per  24 hour  Intake 100 ml  Output -  Net 100 ml   Filed Weights   07/12/18 1251 07/12/18 1727  Weight: 66.7 kg 66.7 kg    Examination:  General exam: Appears very uncomfortable due to pain, tearful Respiratory system: Clear to auscultation. Respiratory effort normal. Cardiovascular system: S1 & S2 heard, RRR. No JVD, murmurs, rubs, gallops or clicks. No pedal edema. Gastrointestinal system: Abdomen is nondistended, soft  and nontender. No organomegaly or masses felt. Normal bowel sounds heard. Extremities: moving well in the bed Skin: No rashes, lesions or ulcers Psychiatry: Judgement and insight appear normal. Mood & affect tearful, mildly anxious    Data Reviewed: I have personally reviewed following labs and imaging studies  CBC: Recent Labs  Lab 07/12/18 1709  WBC 4.9  NEUTROABS 2.2  HGB 11.3*  HCT 35.7*  MCV 91.3  PLT 027   Basic Metabolic Panel: Recent Labs  Lab 07/12/18 1709  NA 141  K 3.4*  CL 108  CO2 28  GLUCOSE 75  BUN 9  CREATININE 0.83  CALCIUM 8.0*   GFR: Estimated Creatinine Clearance: 68.1 mL/min (by C-G formula based on SCr of 0.83 mg/dL). Liver Function Tests: No results for input(s): AST, ALT, ALKPHOS, BILITOT, PROT, ALBUMIN in the last 168 hours. No results for input(s): LIPASE, AMYLASE in the last 168 hours. No results for input(s): AMMONIA in the last 168 hours. Coagulation Profile: No results for input(s): INR, PROTIME in the last 168 hours. Cardiac Enzymes: No results for input(s): CKTOTAL, CKMB, CKMBINDEX, TROPONINI in the last 168 hours. BNP (last 3 results) No results for input(s): PROBNP in the last 8760 hours. HbA1C: No results for input(s): HGBA1C in the last 72 hours. CBG: No results for input(s): GLUCAP in the last 168 hours. Lipid Profile: No results for input(s): CHOL, HDL, LDLCALC, TRIG, CHOLHDL, LDLDIRECT in the last 72 hours. Thyroid Function Tests: No results for input(s): TSH, T4TOTAL, FREET4, T3FREE, THYROIDAB in the last 72 hours. Anemia Panel: No results for input(s): VITAMINB12, FOLATE, FERRITIN, TIBC, IRON, RETICCTPCT in the last 72 hours. Sepsis Labs: No results for input(s): PROCALCITON, LATICACIDVEN in the last 168 hours.  No results found for this or any previous visit (from the past 240 hour(s)).       Radiology Studies: Ct Thoracic Spine Wo Contrast  Result Date: 07/12/2018 CLINICAL DATA:  Fall.  Back pain.  History  of lumbar surgery. EXAM: CT THORACIC AND LUMBAR SPINE WITHOUT CONTRAST TECHNIQUE: Multidetector CT imaging of the thoracic and lumbar spine was performed without contrast. Multiplanar CT image reconstructions were also generated. COMPARISON:  None. FINDINGS: CT THORACIC SPINE FINDINGS Alignment: Normal. Vertebrae: No acute fracture or focal pathologic process. Paraspinal and other soft tissues: Negative. Disc levels: No visible disc herniation or spinal canal stenosis. CT LUMBAR SPINE FINDINGS Segmentation: Standard Alignment: Root normal Vertebrae: No acute fracture or focal pathologic process. Paraspinal and other soft tissues: Mild calcific aortic atherosclerosis. Disc levels: There is no traumatic disc herniation, spinal canal stenosis or nerve root impingement. The patient has undergone bilateral decompressive laminectomies at the L4-5 level. There is moderate left facet hypertrophy at L3-4. IMPRESSION: CT THORACIC SPINE IMPRESSION No acute abnormality or evidence of spinal canal or neural foraminal stenosis. CT LUMBAR SPINE IMPRESSION Postsurgical changes of bilateral L4 laminectomies. No acute abnormality, spinal canal stenosis or nerve root impingement. Electronically Signed   By: Ulyses Jarred M.D.   On: 07/12/2018 14:44   Ct Lumbar Spine Wo Contrast  Result Date: 07/12/2018 CLINICAL  DATA:  Fall.  Back pain.  History of lumbar surgery. EXAM: CT THORACIC AND LUMBAR SPINE WITHOUT CONTRAST TECHNIQUE: Multidetector CT imaging of the thoracic and lumbar spine was performed without contrast. Multiplanar CT image reconstructions were also generated. COMPARISON:  None. FINDINGS: CT THORACIC SPINE FINDINGS Alignment: Normal. Vertebrae: No acute fracture or focal pathologic process. Paraspinal and other soft tissues: Negative. Disc levels: No visible disc herniation or spinal canal stenosis. CT LUMBAR SPINE FINDINGS Segmentation: Standard Alignment: Root normal Vertebrae: No acute fracture or focal pathologic  process. Paraspinal and other soft tissues: Mild calcific aortic atherosclerosis. Disc levels: There is no traumatic disc herniation, spinal canal stenosis or nerve root impingement. The patient has undergone bilateral decompressive laminectomies at the L4-5 level. There is moderate left facet hypertrophy at L3-4. IMPRESSION: CT THORACIC SPINE IMPRESSION No acute abnormality or evidence of spinal canal or neural foraminal stenosis. CT LUMBAR SPINE IMPRESSION Postsurgical changes of bilateral L4 laminectomies. No acute abnormality, spinal canal stenosis or nerve root impingement. Electronically Signed   By: Ulyses Jarred M.D.   On: 07/12/2018 14:44        Scheduled Meds: . atorvastatin  20 mg Oral Daily  . carvedilol  6.25 mg Oral BID  . enoxaparin (LOVENOX) injection  40 mg Subcutaneous Q24H  . estrogens (conjugated)  0.625 mg Oral Daily  . gabapentin  300 mg Oral TID  . traMADol  50 mg Oral Q12H   Continuous Infusions: . methocarbamol (ROBAXIN) IV 500 mg (07/13/18 0542)     LOS: 0 days        Eulogio Bear DO [facility]   If 7PM-7AM, please contact night-coverage [email or web address] Password [facility] 07/13/2018, 11:49 AM

## 2018-07-13 NOTE — Telephone Encounter (Signed)
[name] said someone called her about her x ray and possible MRI.  She is in [facility] currently and said to call her on her [phone]

## 2018-07-13 NOTE — Progress Notes (Signed)
Pt reported N/V and ambulated to bathroom with walker. Pt was not wearing rt leg brace and on her way back from bathroom stated that her rt knee gave out and she lowered herself with the walker and put her rt knee on the ground. Pt denies hard impact. No injuries to knee. Pt assisted back into bed with legs propped on pillows. Denies any new pain.

## 2018-07-13 NOTE — Progress Notes (Signed)
Patient repositioned on bed for comfort.

## 2018-07-13 NOTE — Progress Notes (Signed)
Pt transported to MRI 

## 2018-07-13 NOTE — Consult Note (Signed)
Reason for Consult :low back pain Referring Physician: [facility] hospitalist  [name] is an 57 y.o. female.  HPI: is a pleasant 57 year old female has a history of a of lumbar revision decompression L4-5.  Had a complicated postoperative course that required a return to the operating room for unexplained postoperative neurologic deficit.  She had a revision decompression L4-5 noted with spinal stenosis.  But minimal bleeding.  Patient slowly recovered postoperatively.  There was a question of a conversion component to her symptomatology.  Postoperative MRI of the thoracic lumbar and cervical spine demonstrated no neural compressive lesion of the spinal cord or proximally.  She has been making gradual improvement in physical therapy.  Has returned to work in a sedentary position 3 hours per day.  She was sitting on a rolling chair on a hard surface she got up to walk from her desk the chair rolled out from underneath her she fell directly onto her buttock had acute pain.  Which she was seen in the emergency room last night where CT scans of the thoracic and lumbar spine demonstrate no evidence of an acute fracture.  She was admitted for pain control she was unable to return to home due to significant pain.  Past Medical History:  Diagnosis Date  . Anxiety   . Back pain    related to spinal stenosis and disc problem, radiates down left buttocks to leg., weakness occ.  [name] Dyspnea   . Headache   . Hyperlipidemia   . Hypertension   . PONV (postoperative nausea and vomiting)   . Vaginal foreign object    "Uses Femring"    Past Surgical History:  Procedure Laterality Date  . ABDOMINAL HYSTERECTOMY    . CARDIAC CATHETERIZATION N/A 04/18/2015   Procedure: Left Heart Cath and Coronary Angiography;  Surgeon: [name], MD;  Location: [facility];  Service: Cardiovascular;  Laterality: N/A;  . FOOT SURGERY Bilateral    [place] "bunion,bone spur, tendon" (1) -6'16, (1)-10'16   . LUMBAR LAMINECTOMY/DECOMPRESSION MICRODISCECTOMY Bilateral 12/28/2015   Procedure: MICRO LUMBAR DECOMPRESSION L4 - L5 BILATERALLY;  Surgeon: [name], MD;  Location: [facility];  Service: Orthopedics;  Laterality: Bilateral;  . LUMBAR LAMINECTOMY/DECOMPRESSION MICRODISCECTOMY Bilateral 03/04/2018   Procedure: Revision of Microlumbar Decompression Bilateral Lumbar Four-Five;  Surgeon: [name], MD;  Location: [facility];  Service: Orthopedics;  Laterality: Bilateral;  90 mins  . WISDOM TOOTH EXTRACTION    . WOUND EXPLORATION N/A 03/04/2018   Procedure: EXPLORATION OF LUMBAR DECOMPRESSION WOUND;  Surgeon: [name], MD;  Location: [facility];  Service: Orthopedics;  Laterality: N/A;    Family History  Problem Relation Age of Onset  . Heart attack Mother   . Lung cancer Father   . Cancer Father   . Pancreatic cancer Sister   . Breast cancer Sister 43  . Throat cancer Brother   . Multiple myeloma Sister   . Breast cancer Sister        diagnosed in her 90's  . Heart attack Sister   . Stomach cancer Cousin   . Colon cancer Neg Hx     Social History:  reports that she has never smoked. She has never used smokeless tobacco. She reports that she does not drink alcohol or use drugs.  Allergies:  Allergies  Allergen Reactions  . Cephalosporins Anaphylaxis  . Penicillins Anaphylaxis and Hives    Has patient had a PCN reaction causing immediate rash, facial/tongue/throat swelling, SOB or lightheadedness with hypotension:  Yes Has patient had a PCN reaction causing severe rash involving mucus membranes or skin necrosis: Yes Has patient had a PCN reaction that required hospitalization Yes Has patient had a PCN reaction occurring within the last 10 years: No If all of the above answers are "NO", then may proceed with Cephalosporin use.   . Anesthetics, Amide Nausea And Vomiting    Does not know name of it. States they put it on record foot center.   [name] Peach [Prunus Persica] Hives  . Latex  Hives, Itching and Rash    Medications: I have reviewed the patient's current medications.  Results for orders placed or performed during the hospital encounter of 07/12/18 (from the past 48 hour(s))  CBC with Differential     Status: Abnormal   Collection Time: 07/12/18  5:09 PM  Result Value Ref Range   WBC 4.9 4.0 - 10.5 K/uL   RBC 3.91 3.87 - 5.11 MIL/uL   Hemoglobin 11.3 (L) 12.0 - 15.0 g/dL   HCT 35.7 (L) 36.0 - 46.0 %   MCV 91.3 78.0 - 100.0 fL   MCH 28.9 26.0 - 34.0 pg   MCHC 31.7 30.0 - 36.0 g/dL   RDW 13.2 11.5 - 15.5 %   Platelets 238 150 - 400 K/uL   Neutrophils Relative % 45 %   Neutro Abs 2.2 1.7 - 7.7 K/uL   Lymphocytes Relative 45 %   Lymphs Abs 2.2 0.7 - 4.0 K/uL   Monocytes Relative 8 %   Monocytes Absolute 0.4 0.1 - 1.0 K/uL   Eosinophils Relative 1 %   Eosinophils Absolute 0.0 0.0 - 0.7 K/uL   Basophils Relative 1 %   Basophils Absolute 0.0 0.0 - 0.1 K/uL   Immature Granulocytes 0 %   Abs Immature Granulocytes 0.0 0.0 - 0.1 K/uL    Comment: Performed at [facility], [address], [place], [place] [address]  Basic metabolic panel     Status: Abnormal   Collection Time: 07/12/18  5:09 PM  Result Value Ref Range   Sodium 141 135 - 145 mmol/L   Potassium 3.4 (L) 3.5 - 5.1 mmol/L   Chloride 108 98 - 111 mmol/L   CO2 28 22 - 32 mmol/L   Glucose, Bld 75 70 - 99 mg/dL   BUN 9 6 - 20 mg/dL   Creatinine, Ser 0.83 0.44 - 1.00 mg/dL   Calcium 8.0 (L) 8.9 - 10.3 mg/dL   GFR calc non Af Amer >60 >60 mL/min   GFR calc Af Amer >60 >60 mL/min    Comment: (NOTE) The eGFR has been calculated using the CKD EPI equation. This calculation has not been validated in all clinical situations. eGFR's persistently <60 mL/min signify possible Chronic Kidney Disease.    Anion gap 5 5 - 15    Comment: Performed at [facility] [address], [place],  [address]  Urinalysis, Routine w reflex microscopic     Status: Abnormal   Collection Time: 07/12/18   5:09 PM  Result Value Ref Range   Color, Urine STRAW (A) YELLOW   APPearance CLEAR CLEAR   Specific Gravity, Urine 1.005 1.005 - 1.030   pH 7.0 5.0 - 8.0   Glucose, UA NEGATIVE NEGATIVE mg/dL   Hgb urine dipstick NEGATIVE NEGATIVE   Bilirubin Urine NEGATIVE NEGATIVE   Ketones, ur NEGATIVE NEGATIVE mg/dL   Protein, ur NEGATIVE NEGATIVE mg/dL   Nitrite NEGATIVE NEGATIVE   Leukocytes, UA NEGATIVE NEGATIVE  Comment: Performed at [facility], [place] [address], [place], [place] [address]  HIV antibody (Routine Testing)     Status: None   Collection Time: 07/13/18  3:21 AM  Result Value Ref Range   HIV Screen 4th Generation wRfx Non Reactive Non Reactive    Comment: (NOTE) Performed At: [facility] [place], [name] [id number] Rush Farmer MD ER:[id number]     Ct Thoracic Spine Wo Contrast  Result Date: 07/12/2018 CLINICAL DATA:  Fall.  Back pain.  History of lumbar surgery. EXAM: CT THORACIC AND LUMBAR SPINE WITHOUT CONTRAST TECHNIQUE: Multidetector CT imaging of the thoracic and lumbar spine was performed without contrast. Multiplanar CT image reconstructions were also generated. COMPARISON:  None. FINDINGS: CT THORACIC SPINE FINDINGS Alignment: Normal. Vertebrae: No acute fracture or focal pathologic process. Paraspinal and other soft tissues: Negative. Disc levels: No visible disc herniation or spinal canal stenosis. CT LUMBAR SPINE FINDINGS Segmentation: Standard Alignment: Root normal Vertebrae: No acute fracture or focal pathologic process. Paraspinal and other soft tissues: Mild calcific aortic atherosclerosis. Disc levels: There is no traumatic disc herniation, spinal canal stenosis or nerve root impingement. The patient has undergone bilateral decompressive laminectomies at the L4-5 level. There is moderate left facet hypertrophy at L3-4. IMPRESSION: CT THORACIC SPINE IMPRESSION No acute abnormality or evidence of spinal canal or neural foraminal  stenosis. CT LUMBAR SPINE IMPRESSION Postsurgical changes of bilateral L4 laminectomies. No acute abnormality, spinal canal stenosis or nerve root impingement. Electronically Signed   By: Ulyses Jarred M.D.   On: 07/12/2018 14:44   Ct Lumbar Spine Wo Contrast  Result Date: 07/12/2018 CLINICAL DATA:  Fall.  Back pain.  History of lumbar surgery. EXAM: CT THORACIC AND LUMBAR SPINE WITHOUT CONTRAST TECHNIQUE: Multidetector CT imaging of the thoracic and lumbar spine was performed without contrast. Multiplanar CT image reconstructions were also generated. COMPARISON:  None. FINDINGS: CT THORACIC SPINE FINDINGS Alignment: Normal. Vertebrae: No acute fracture or focal pathologic process. Paraspinal and other soft tissues: Negative. Disc levels: No visible disc herniation or spinal canal stenosis. CT LUMBAR SPINE FINDINGS Segmentation: Standard Alignment: Root normal Vertebrae: No acute fracture or focal pathologic process. Paraspinal and other soft tissues: Mild calcific aortic atherosclerosis. Disc levels: There is no traumatic disc herniation, spinal canal stenosis or nerve root impingement. The patient has undergone bilateral decompressive laminectomies at the L4-5 level. There is moderate left facet hypertrophy at L3-4. IMPRESSION: CT THORACIC SPINE IMPRESSION No acute abnormality or evidence of spinal canal or neural foraminal stenosis. CT LUMBAR SPINE IMPRESSION Postsurgical changes of bilateral L4 laminectomies. No acute abnormality, spinal canal stenosis or nerve root impingement. Electronically Signed   By: Ulyses Jarred M.D.   On: 07/12/2018 14:44    Review of Systems  Musculoskeletal: Positive for back pain and falls.  Neurological: Positive for weakness.  All other systems reviewed and are negative.  Blood pressure (!) 142/80, pulse 76, temperature 97.7 F (36.5 C), temperature source Oral, resp. rate 16, height '5\' 5"'$  (1.651 m), weight 66.7 kg, SpO2 96 %. Physical Exam  Constitutional: She is  oriented to person, place, and time. She appears well-developed.  HENT:  Head: Normocephalic.  Neck: Normal range of motion.  Cardiovascular: Normal rate.  GI: Soft.  Musculoskeletal: She exhibits tenderness.  Patient supine position.  She is tender lumbosacral junction left PSIS.  The sacrum.  Straight leg raise produces low back pain.  Neurological: She is alert and oriented to person, place, and time.  Motor exam is  5/5 in the upper and left lower extremity. normoreflexic in the upper extremities.  Right lower extremity she does have giving way weakness in dorsiflexion and plantar flexion.  3/5 in dorsiflexion EHL.  And plantar flexion.  Decreased sensation the plantar aspect of the foot and the dorsum of the foot.  2+ dorsalis pedis posterior tibial tendon.  Decreased reflex Achilles. Peroneal sensation is intact.  She has good rectal tone with a rectal exam. No evidence of DVT.  Skin: Skin is warm and dry.  Psychiatric:  Patient moderate distress.    Assessment/Plan:  Patient demonstrates mechanical back pain secondary to a fall directly onto the sacrum and buttocks. The CT scans demonstrate no evidence of an acute fracture.  Patient is neurologically unchanged since her previous exam.  Though she has more of a radicular complement with back pain.  I discussed with her physician placing her on IV Decadron dose followed by a steroid dose pack.  In addition they have requested an MRI for further evaluation to rule out an occult fracture or that she had some hesitancy in urination.  She had a bladder scan that showed complete emptying though.  And a normal rectal exam with good tone.  No evidence of clinical cauda equina syndrome.  I am in agreement with pain management.  Obtaining the MRI.  Engaging in form of supervised physical therapy.  May have just loaded her facet joints her disc may have an occult insufficiency fracture. We will follow with you.  Violett Hobbs C 07/13/2018, 5:33  PM   [phone]

## 2018-07-13 NOTE — Progress Notes (Signed)
Pt says that it is difficult to keep a steady stream of urine.  And difficult to get started.

## 2018-07-14 DIAGNOSIS — M21371 Foot drop, right foot: Secondary | ICD-10-CM | POA: Diagnosis not present

## 2018-07-14 DIAGNOSIS — D62 Acute posthemorrhagic anemia: Secondary | ICD-10-CM | POA: Diagnosis not present

## 2018-07-14 DIAGNOSIS — M25562 Pain in left knee: Secondary | ICD-10-CM | POA: Diagnosis not present

## 2018-07-14 DIAGNOSIS — M5442 Lumbago with sciatica, left side: Secondary | ICD-10-CM | POA: Diagnosis not present

## 2018-07-14 DIAGNOSIS — M48061 Spinal stenosis, lumbar region without neurogenic claudication: Secondary | ICD-10-CM | POA: Diagnosis not present

## 2018-07-14 DIAGNOSIS — Z8249 Family history of ischemic heart disease and other diseases of the circulatory system: Secondary | ICD-10-CM | POA: Diagnosis not present

## 2018-07-14 DIAGNOSIS — Z801 Family history of malignant neoplasm of trachea, bronchus and lung: Secondary | ICD-10-CM | POA: Diagnosis not present

## 2018-07-14 DIAGNOSIS — Z79899 Other long term (current) drug therapy: Secondary | ICD-10-CM | POA: Diagnosis not present

## 2018-07-14 DIAGNOSIS — F419 Anxiety disorder, unspecified: Secondary | ICD-10-CM | POA: Diagnosis not present

## 2018-07-14 DIAGNOSIS — F411 Generalized anxiety disorder: Secondary | ICD-10-CM | POA: Diagnosis not present

## 2018-07-14 DIAGNOSIS — Y990 Civilian activity done for income or pay: Secondary | ICD-10-CM | POA: Diagnosis not present

## 2018-07-14 DIAGNOSIS — M5441 Lumbago with sciatica, right side: Secondary | ICD-10-CM | POA: Diagnosis not present

## 2018-07-14 DIAGNOSIS — G8929 Other chronic pain: Secondary | ICD-10-CM | POA: Diagnosis not present

## 2018-07-14 DIAGNOSIS — M5416 Radiculopathy, lumbar region: Secondary | ICD-10-CM | POA: Diagnosis not present

## 2018-07-14 DIAGNOSIS — I129 Hypertensive chronic kidney disease with stage 1 through stage 4 chronic kidney disease, or unspecified chronic kidney disease: Secondary | ICD-10-CM | POA: Diagnosis present

## 2018-07-14 DIAGNOSIS — W1830XA Fall on same level, unspecified, initial encounter: Secondary | ICD-10-CM | POA: Diagnosis present

## 2018-07-14 DIAGNOSIS — W19XXXA Unspecified fall, initial encounter: Secondary | ICD-10-CM | POA: Diagnosis not present

## 2018-07-14 DIAGNOSIS — I10 Essential (primary) hypertension: Secondary | ICD-10-CM | POA: Diagnosis not present

## 2018-07-14 DIAGNOSIS — D638 Anemia in other chronic diseases classified elsewhere: Secondary | ICD-10-CM | POA: Diagnosis present

## 2018-07-14 DIAGNOSIS — N182 Chronic kidney disease, stage 2 (mild): Secondary | ICD-10-CM | POA: Diagnosis present

## 2018-07-14 DIAGNOSIS — Z803 Family history of malignant neoplasm of breast: Secondary | ICD-10-CM | POA: Diagnosis not present

## 2018-07-14 DIAGNOSIS — D72829 Elevated white blood cell count, unspecified: Secondary | ICD-10-CM | POA: Diagnosis present

## 2018-07-14 DIAGNOSIS — K5900 Constipation, unspecified: Secondary | ICD-10-CM | POA: Diagnosis present

## 2018-07-14 DIAGNOSIS — Z4789 Encounter for other orthopedic aftercare: Secondary | ICD-10-CM | POA: Diagnosis not present

## 2018-07-14 DIAGNOSIS — R269 Unspecified abnormalities of gait and mobility: Secondary | ICD-10-CM | POA: Diagnosis not present

## 2018-07-14 DIAGNOSIS — E785 Hyperlipidemia, unspecified: Secondary | ICD-10-CM | POA: Diagnosis present

## 2018-07-14 DIAGNOSIS — Z807 Family history of other malignant neoplasms of lymphoid, hematopoietic and related tissues: Secondary | ICD-10-CM | POA: Diagnosis not present

## 2018-07-14 DIAGNOSIS — M5440 Lumbago with sciatica, unspecified side: Secondary | ICD-10-CM | POA: Diagnosis not present

## 2018-07-14 DIAGNOSIS — M549 Dorsalgia, unspecified: Secondary | ICD-10-CM | POA: Diagnosis not present

## 2018-07-14 DIAGNOSIS — T380X5A Adverse effect of glucocorticoids and synthetic analogues, initial encounter: Secondary | ICD-10-CM | POA: Diagnosis present

## 2018-07-14 DIAGNOSIS — M47817 Spondylosis without myelopathy or radiculopathy, lumbosacral region: Secondary | ICD-10-CM | POA: Diagnosis not present

## 2018-07-14 DIAGNOSIS — Z800 Family history of malignant neoplasm of digestive organs: Secondary | ICD-10-CM | POA: Diagnosis not present

## 2018-07-14 DIAGNOSIS — Z808 Family history of malignant neoplasm of other organs or systems: Secondary | ICD-10-CM | POA: Diagnosis not present

## 2018-07-14 DIAGNOSIS — M5116 Intervertebral disc disorders with radiculopathy, lumbar region: Secondary | ICD-10-CM | POA: Diagnosis present

## 2018-07-14 DIAGNOSIS — F410 Panic disorder [episodic paroxysmal anxiety] without agoraphobia: Secondary | ICD-10-CM | POA: Diagnosis not present

## 2018-07-14 DIAGNOSIS — M545 Low back pain: Secondary | ICD-10-CM | POA: Diagnosis not present

## 2018-07-14 MED ORDER — PREDNISONE 10 MG (21) PO TBPK
10.0000 mg | ORAL_TABLET | ORAL | Status: AC
  Administered 2018-07-14: 10 mg via ORAL
  Filled 2018-07-14: qty 21

## 2018-07-14 MED ORDER — PREDNISONE 10 MG (21) PO TBPK
10.0000 mg | ORAL_TABLET | Freq: Three times a day (TID) | ORAL | Status: AC
  Administered 2018-07-15 (×3): 10 mg via ORAL

## 2018-07-14 MED ORDER — PREDNISONE 10 MG (21) PO TBPK
20.0000 mg | ORAL_TABLET | Freq: Every evening | ORAL | Status: AC
  Administered 2018-07-15: 20 mg via ORAL

## 2018-07-14 MED ORDER — PREDNISONE 10 MG (21) PO TBPK
10.0000 mg | ORAL_TABLET | Freq: Four times a day (QID) | ORAL | Status: AC
  Administered 2018-07-16 – 2018-07-19 (×10): 10 mg via ORAL

## 2018-07-14 MED ORDER — PREDNISONE 10 MG (21) PO TBPK
10.0000 mg | ORAL_TABLET | ORAL | Status: AC
  Administered 2018-07-14: 10 mg via ORAL

## 2018-07-14 MED ORDER — PREDNISONE 10 MG (21) PO TBPK
20.0000 mg | ORAL_TABLET | Freq: Every morning | ORAL | Status: AC
  Administered 2018-07-14: 20 mg via ORAL
  Filled 2018-07-14 (×2): qty 21

## 2018-07-14 MED ORDER — PREDNISONE 10 MG (21) PO TBPK
20.0000 mg | ORAL_TABLET | Freq: Every evening | ORAL | Status: AC
  Administered 2018-07-14: 20 mg via ORAL

## 2018-07-14 NOTE — Progress Notes (Signed)
IV team at bedside 

## 2018-07-14 NOTE — Progress Notes (Signed)
Pt oob in room standing at sink for hygiene .states opain now 7/10.

## 2018-07-14 NOTE — Progress Notes (Signed)
Patient ID: [name], female   DOB: 03-Nov-1960, 57 y.o.   MRN: [id number]  Spoke with patient. Patient still in a fair amount of pain. I have reviewed the MRI.  No acute fracture.  There is an annular tear at L4-5 with a small central disc protrusion and neuroforaminal stenosis. No indication for surgical intervention.  Her back pain degree may be secondary to a lumbosacral contusion as well as annular tear.  I discussed with the patient including the steroid dose pack.  Apparently pharmacy does not have a steroid dose pack on pharmacy.  I did discuss with the pharmacist dispensing that in a similar fashion to that which is in a 10 mg 6 day dose pack. If that was not efficacious then consideration of an epidural steroid injection at L4-5.  I also informed the patient of that.  Recommend physical therapy.  Utilization of a brace that she has at home.  Patient does not feel that she is ready to go home.  She may benefit by a short course of rehabilitation and that she was in after her surgery.  Had attempted to call the hospitalist they spoke to yesterday Dr. Shawn Route But was unavailable.  I will be available today for a call to discuss her disposition.  Give him my cell phone number is [phone].  [name]

## 2018-07-14 NOTE — Progress Notes (Signed)
Called to pt room where pt is tearful and states her pain is 10/10.

## 2018-07-14 NOTE — Progress Notes (Signed)
Pt c/o itching at iv site. Site is clear and pt has good flush and blood return. Pt requesting new site. Will inform IV team.

## 2018-07-14 NOTE — Progress Notes (Signed)
Patient c/o increased back pain aware Robaxin is now infusing, sat up on the side of the bed, ambulated to bathroom with walker no difficulty. Heating packs given to place on her back. States it was starting to feel some better.

## 2018-07-14 NOTE — Progress Notes (Signed)
Pain reassed after medication and pt is talking on phone. States pain has decreased to 8/10. Appears more relaxed.

## 2018-07-14 NOTE — Progress Notes (Signed)
PROGRESS NOTE    [name]  HCW:[id number] DOB: 1961/07/25 DOA: 07/12/2018 PCP: [name], [name], MD   Brief Narrative:  57 year old woman with a history of hypertension, hyperlipidemia, anxiety, as well as status post L for L5 lumbar decompression in March 2019, and status post bilateral lumbar laminectomy with decompression and microdiscectomy in May 2019, Dr. Rudene Anda, presenting to the emergency department on 07/12/2018 after sustaining a fall while at work. CT of the thoracic and lumbar spine, were negative for fracture, or focal pathologic process.  she was started on IV morphine, oxycodone every 4 hours as needed, getting hydration, and then given IV Robaxin for muscle spasm, with no significant improvement.  She describes the pain is severe, 8 out of 10.  She denies any left lower extremity weakness or numbness, and she does have permanent, postoperative right foot numbness.  Status was complicated this morning, with difficulty initiating urination, "cannot keep a steady stream ".  She denies any dysuria or gross hematuria.  She denies any bowel incontinence.  Last bowel movement was on 07/12/2018.  She denies any saddle anesthesia.  MRI of the lumbar spine is pending.  Case discussed with Dr. Rudene Anda who also recommends a dose of Decadron 10 mg IV, then followed by 10 mg Decadron Dosepak.  07/14/2018: Orthopedic team input is highly appreciated.  Orthopedic surgeon is directing management of patient's active problem (back pain).   Assessment & Plan:   Principal Problem:   Back pain Active Problems:   Benign essential HTN   Hyperlipidemia   Generalized anxiety disorder with panic attacks   Difficulty in urination   Severe back pain, after fall CT of the thoracic and lumbar spine, were negative for fracture, or focal pathologic process.   MRI of the lumbar spine is pending.  Orthopedic to follow on the results, with further recommendation, appreciate involvement  Case discussed with Dr. Rudene Anda who also recommends a dose of Decadron 10 mg IV, then followed by 10 mg Decadron Dosepak. Pain control: scheduled Ultram 50 mg every 12 hours and Percocet 1 to 2 tablets every 4 hours as needed-- use IV pain medications for breakthrough pain Continue Neurontin -robaxin IV for muscle spasms -bowel regimen -Further care as per the orthopedic team.  Difficulty with urination, post fall Not consistent with cauda equina.  Bladder scan without residual. MRI of the lumbar spine is pending, will follow results 07/14/2018: This has resolved.  Hypertension BP  Continue home meds with Coreg  Hyperlipidemia, Continue Lipitor 20 mg daily  Anxiety Continue Valium  Normocytic anemia, hemoglobin at 11 on admission CBC in a.m.     DVT prophylaxis: Lovenox Code Status: Full code  Family Communication: None Disposition Plan: To home when stable  Consultants:    Dr. Rudene Anda, orthopedics   Procedures:  None  Antimicrobials:   None   Subjective: Patient continues to report back pain.  Objective: Vitals:   07/13/18 2200 07/14/18 0012 07/14/18 0552 07/14/18 0900  BP:  (!) 142/84 (!) 146/96 (!) 152/100  Pulse: 88 94 81 83  Resp:  16 16 18   Temp:  98 F (36.7 C) 97.6 F (36.4 C) 97.7 F (36.5 C)  TempSrc:  Oral Oral Oral  SpO2:  99% 100%   Weight:      Height:        Intake/Output Summary (Last 24 hours) at 07/14/2018 1022 Last data filed at 07/14/2018 0500 Gross per 24 hour  Intake 0 ml  Output -  Net 0 ml   Filed Weights   07/12/18 1251 07/12/18 1727  Weight: 66.7 kg 66.7 kg    Examination:  General exam: Appears very uncomfortable due to pain, tearful Respiratory system: Clear to auscultation. Respiratory effort normal. Cardiovascular system: S1 & S2 heard, RRR. No JVD, murmurs, rubs, gallops or clicks. No pedal edema. Gastrointestinal system: Abdomen is nondistended, soft and nontender. No organomegaly or masses  felt. Normal bowel sounds heard. Extremities: moving well in the bed Skin: No rashes, lesions or ulcers Psychiatry: Judgement and insight appear normal. Mood & affect tearful, mildly anxious    Data Reviewed: I have personally reviewed following labs and imaging studies  CBC: Recent Labs  Lab 07/12/18 1709  WBC 4.9  NEUTROABS 2.2  HGB 11.3*  HCT 35.7*  MCV 91.3  PLT 211   Basic Metabolic Panel: Recent Labs  Lab 07/12/18 1709  NA 141  K 3.4*  CL 108  CO2 28  GLUCOSE 75  BUN 9  CREATININE 0.83  CALCIUM 8.0*   GFR: Estimated Creatinine Clearance: 68.1 mL/min (by C-G formula based on SCr of 0.83 mg/dL). Liver Function Tests: No results for input(s): AST, ALT, ALKPHOS, BILITOT, PROT, ALBUMIN in the last 168 hours. No results for input(s): LIPASE, AMYLASE in the last 168 hours. No results for input(s): AMMONIA in the last 168 hours. Coagulation Profile: No results for input(s): INR, PROTIME in the last 168 hours. Cardiac Enzymes: No results for input(s): CKTOTAL, CKMB, CKMBINDEX, TROPONINI in the last 168 hours. BNP (last 3 results) No results for input(s): PROBNP in the last 8760 hours. HbA1C: No results for input(s): HGBA1C in the last 72 hours. CBG: No results for input(s): GLUCAP in the last 168 hours. Lipid Profile: No results for input(s): CHOL, HDL, LDLCALC, TRIG, CHOLHDL, LDLDIRECT in the last 72 hours. Thyroid Function Tests: No results for input(s): TSH, T4TOTAL, FREET4, T3FREE, THYROIDAB in the last 72 hours. Anemia Panel: No results for input(s): VITAMINB12, FOLATE, FERRITIN, TIBC, IRON, RETICCTPCT in the last 72 hours. Sepsis Labs: No results for input(s): PROCALCITON, LATICACIDVEN in the last 168 hours.  No results found for this or any previous visit (from the past 240 hour(s)).       Radiology Studies: Ct Thoracic Spine Wo Contrast  Result Date: 07/12/2018 CLINICAL DATA:  Fall.  Back pain.  History of lumbar surgery. EXAM: CT THORACIC AND  LUMBAR SPINE WITHOUT CONTRAST TECHNIQUE: Multidetector CT imaging of the thoracic and lumbar spine was performed without contrast. Multiplanar CT image reconstructions were also generated. COMPARISON:  None. FINDINGS: CT THORACIC SPINE FINDINGS Alignment: Normal. Vertebrae: No acute fracture or focal pathologic process. Paraspinal and other soft tissues: Negative. Disc levels: No visible disc herniation or spinal canal stenosis. CT LUMBAR SPINE FINDINGS Segmentation: Standard Alignment: Root normal Vertebrae: No acute fracture or focal pathologic process. Paraspinal and other soft tissues: Mild calcific aortic atherosclerosis. Disc levels: There is no traumatic disc herniation, spinal canal stenosis or nerve root impingement. The patient has undergone bilateral decompressive laminectomies at the L4-5 level. There is moderate left facet hypertrophy at L3-4. IMPRESSION: CT THORACIC SPINE IMPRESSION No acute abnormality or evidence of spinal canal or neural foraminal stenosis. CT LUMBAR SPINE IMPRESSION Postsurgical changes of bilateral L4 laminectomies. No acute abnormality, spinal canal stenosis or nerve root impingement. Electronically Signed   By: Ulyses Jarred M.D.   On: 07/12/2018 14:44   Ct Lumbar Spine Wo Contrast  Result Date: 07/12/2018 CLINICAL DATA:  Fall.  Back pain.  History of lumbar  surgery. EXAM: CT THORACIC AND LUMBAR SPINE WITHOUT CONTRAST TECHNIQUE: Multidetector CT imaging of the thoracic and lumbar spine was performed without contrast. Multiplanar CT image reconstructions were also generated. COMPARISON:  None. FINDINGS: CT THORACIC SPINE FINDINGS Alignment: Normal. Vertebrae: No acute fracture or focal pathologic process. Paraspinal and other soft tissues: Negative. Disc levels: No visible disc herniation or spinal canal stenosis. CT LUMBAR SPINE FINDINGS Segmentation: Standard Alignment: Root normal Vertebrae: No acute fracture or focal pathologic process. Paraspinal and other soft tissues:  Mild calcific aortic atherosclerosis. Disc levels: There is no traumatic disc herniation, spinal canal stenosis or nerve root impingement. The patient has undergone bilateral decompressive laminectomies at the L4-5 level. There is moderate left facet hypertrophy at L3-4. IMPRESSION: CT THORACIC SPINE IMPRESSION No acute abnormality or evidence of spinal canal or neural foraminal stenosis. CT LUMBAR SPINE IMPRESSION Postsurgical changes of bilateral L4 laminectomies. No acute abnormality, spinal canal stenosis or nerve root impingement. Electronically Signed   By: Ulyses Jarred M.D.   On: 07/12/2018 14:44   Mr Lumbar Spine W Wo Contrast  Result Date: 07/13/2018 CLINICAL DATA:  Low back pain, fall at work July 12, 2018. Status post redo L4-5 laminectomies and discectomy Mar 04, 2018. EXAM: MRI LUMBAR SPINE WITHOUT AND WITH CONTRAST TECHNIQUE: Multiplanar and multiecho pulse sequences of the lumbar spine were obtained without and with intravenous contrast. CONTRAST:  6 cc Gadavist COMPARISON:  CT lumbar spine July 12, 2018 and MRI lumbar spine Mar 08, 2018 FINDINGS: SEGMENTATION: For the purposes of this report, the last well-formed intervertebral disc is reported as L5-S1. ALIGNMENT: Maintained lumbar lordosis. No malalignment. VERTEBRAE: Vertebral bodies are intact. Stable moderate L1-2, L2-3 disc height loss, mild at L4-5 with mild disc desiccation at these 3 levels. Multilevel mild chronic discogenic endplate changes scattered old Schmorl's nodes. No suspicious or acute bone marrow signal. No abnormal osseous or disc enhancement. Congenital canal narrowing on the basis of foreshortened pedicles. CONUS MEDULLARIS AND CAUDA EQUINA: Conus medullaris terminates at L1-2 and demonstrates normal morphology and signal characteristics. Cauda equina is normal. No abnormal cord, leptomeningeal or epidural enhancement. No susceptibility artifact to suggest blood products. PARASPINAL AND OTHER SOFT TISSUES:  Postoperative paraspinal muscle denervation without focal fluid collection along surgical approach. DISC LEVELS: L1-2: Tiny central disc protrusion without canal stenosis or neural foraminal narrowing. L2-3: Similar 4 mm broad-based disc bulge. Mild canal stenosis. Minimal neural foraminal narrowing. L3-4: Minimal RIGHT subarticular extraforaminal disc protrusion. Moderate LEFT facet arthropathy. No canal stenosis or neural foraminal narrowing. L4-5: Bilateral hemi laminectomies. Bilateral subarticular disc protrusions measuring to 4 mm with enhancing annular fissures. No canal stenosis. Mild-to-moderate RIGHT, moderate to severe LEFT neural foraminal narrowing. L5-S1: L5-S1: No disc bulge. Mild facet arthropathy without canal stenosis or neural foraminal narrowing. IMPRESSION: 1. No fracture, malalignment or acute osseous process. Status post redo L4-5 laminectomies. 2. Stable mild canal stenosis L2-3. 3. L2-3 and L4-5 neural foraminal narrowing: Moderate to severe on the LEFT at L4-5. Electronically Signed   By: Elon Alas M.D.   On: 07/13/2018 21:44        Scheduled Meds: . atorvastatin  20 mg Oral Daily  . carvedilol  6.25 mg Oral BID  . enoxaparin (LOVENOX) injection  40 mg Subcutaneous Q24H  . estrogens (conjugated)  0.625 mg Oral Daily  . gabapentin  300 mg Oral TID  . polyethylene glycol  17 g Oral Daily  . predniSONE  10 mg Oral PC lunch  . predniSONE  10 mg Oral PC  supper  . [START ON 07/15/2018] predniSONE  10 mg Oral 3 x daily with food  . [START ON 07/16/2018] predniSONE  10 mg Oral 4X daily taper  . predniSONE  20 mg Oral AC breakfast  . predniSONE  20 mg Oral Nightly  . [START ON 07/15/2018] predniSONE  20 mg Oral Nightly  . traMADol  50 mg Oral Q12H   Continuous Infusions: . sodium chloride 250 mL (07/13/18 1557)  . methocarbamol (ROBAXIN) IV Stopped (07/14/18 4784)     LOS: 0 days    Bonnell Public, MD [facility]   If 7PM-7AM, please contact  night-coverage [email or web address] Password [email or web address] 07/14/2018, 10:22 AM

## 2018-07-14 NOTE — Plan of Care (Signed)
  Problem: Education: Goal: Knowledge of General Education information will improve Description: Including pain rating scale, medication(s)/side effects and non-pharmacologic comfort measures Outcome: Progressing   Problem: Pain Managment: Goal: General experience of comfort will improve Outcome: Progressing   Problem: Safety: Goal: Ability to remain free from injury will improve Outcome: Progressing   

## 2018-07-15 ENCOUNTER — Encounter: Payer: 59 | Admitting: Physical Therapy

## 2018-07-15 MED ORDER — METHOCARBAMOL 500 MG PO TABS
500.0000 mg | ORAL_TABLET | Freq: Three times a day (TID) | ORAL | Status: DC
  Administered 2018-07-15 – 2018-07-20 (×16): 500 mg via ORAL
  Filled 2018-07-15 (×16): qty 1

## 2018-07-15 MED ORDER — SENNOSIDES-DOCUSATE SODIUM 8.6-50 MG PO TABS
1.0000 | ORAL_TABLET | Freq: Two times a day (BID) | ORAL | Status: DC
  Administered 2018-07-15 – 2018-07-20 (×11): 1 via ORAL
  Filled 2018-07-15 (×11): qty 1

## 2018-07-15 NOTE — Care Management Note (Signed)
Case Management Note  Patient Details  Name: SHIREL MALLIS MRN: [id number] Date of Birth: November 03, 1960  Subjective/Objective:   Disc protrusion s/t annulus tear at L2-L3                 Action/Plan: NCM spoke to pt and she has RW, cane, wheelchair, bedside commode, brace, and shower chair at home. She is requesting IP rehab. Pt states she has discussed with attending. Will follow up with PT/OT on recommendations.   Expected Discharge Date:  07/13/18               Expected Discharge Plan:  [place]  In-House Referral:  NA  Discharge planning Services  CM Consult  Post Acute Care Choice:  NA Choice offered to:  NA  DME Arranged:  N/A DME Agency:  NA  HH Arranged:  NA HH Agency:  NA  Status of Service:  In process, will continue to follow  If discussed at Long Length of Stay Meetings, dates discussed:    Additional Comments:  Erenest Rasher, RN 07/15/2018, 3:35 PM

## 2018-07-15 NOTE — Progress Notes (Signed)
Physical Therapy Evaluation Patient Details Name: [name] MRN: [id number] DOB: 05/07/1961 Today's Date: 07/15/2018   History of Present Illness  Pt is a 57 y.o. female presenting with back pain after a fall. MRI demonstrating disc protrusion secondary to annulus tear at L2-L3. PMH is significant for previous back surgery in May 2019, HLD, HTN, and anxiety.   Clinical Impression  Pt admitted with above diagnosis. Pt currently with functional limitations due to the deficits listed below (see PT Problem List). PTA, pt ambulating with RW secondary to R foot drop and numbness. At time of evaluation pt performing transfers and ambulation with gross min guard with RW and R AFO for safety. Educated pt on positioning , use of spinal brace which pt has at home, and gentle spinal extension exercises. Recommending pt resume outpatient PT services at d/c, as pt prefers this over [place]. Will continue to follow acutely to increase independence and safety with mobility to allow discharge to the venue listed below.       Follow Up Recommendations Outpatient PT;Supervision/Assistance - 24 hour    Equipment Recommendations  None recommended by PT    Recommendations for Other Services       Precautions / Restrictions Precautions Precaution Comments: Reviewed back precautions with pt due to worsening lumbar pain  Required Braces or Orthoses: (Has spinal brace at home, requested spouse bring to hospital) Restrictions Weight Bearing Restrictions: No      Mobility  Bed Mobility Overal bed mobility: Needs Assistance Bed Mobility: Rolling;Sidelying to Sit Rolling: Min guard Sidelying to sit: Min guard       General bed mobility comments: Min guard secondary to pain in back. Pt required cues for log roll sequence. Increased time and effort noted.  Transfers Overall transfer level: Needs assistance Equipment used: Rolling walker (2 wheeled) Transfers: Sit to/from Stand Sit to Stand: Min  guard         General transfer comment: Min cues initally for hand placement. Increased time and effort required.   Ambulation/Gait Ambulation/Gait assistance: Min guard Gait Distance (Feet): 3 Feet Assistive device: Rolling walker (2 wheeled) Gait Pattern/deviations: Step-to pattern;Decreased step length - right;Decreased dorsiflexion - right Gait velocity: decreased Gait velocity interpretation: <1.8 ft/sec, indicate of risk for recurrent falls General Gait Details: Pt demonstrating R foot drop with AFO donned limiting her ability to heel strike. Pt required cues for proximity to RW prior to descent. VCs for upright posture in order to avoid symptom worsening with bending.   Stairs            Wheelchair Mobility    Modified Rankin (Stroke Patients Only)       Balance Overall balance assessment: Needs assistance Sitting-balance support: No upper extremity supported;Feet supported Sitting balance-Leahy Scale: Good     Standing balance support: Bilateral upper extremity supported;During functional activity Standing balance-Leahy Scale: Poor Standing balance comment: Reliant on BIL UE support                              Pertinent Vitals/Pain Pain Assessment: Faces Faces Pain Scale: Hurts even more Pain Location: Lumbar region Pain Descriptors / Indicators: Discomfort;Guarding;Grimacing;Pressure;Shooting Pain Intervention(s): Monitored during session;Limited activity within patient's tolerance;Repositioned    Home Living Family/patient expects to be discharged to:: Private residence                      Prior Function Level of Independence: Independent with assistive device(s)  Comments: ambulated with RW secondary to R foot drop/numbness     Hand Dominance        Extremity/Trunk Assessment   Upper Extremity Assessment Upper Extremity Assessment: Overall WFL for tasks assessed    Lower Extremity Assessment Lower  Extremity Assessment: RLE deficits/detail RLE Deficits / Details: Demonstrating R foot drop with reports of R foot numnbess. Was in outpt PT for foot drop PTA. Wears AFO for foot drop, but still with notable deficits when ambulating to chair.     Cervical / Trunk Assessment Cervical / Trunk Assessment: Other exceptions Cervical / Trunk Exceptions: Disc protrustion @ L2-3 with annular tear  Communication   Communication: No difficulties  Cognition Arousal/Alertness: Awake/alert Behavior During Therapy: WFL for tasks assessed/performed Overall Cognitive Status: Within Functional Limits for tasks assessed                                        General Comments      Exercises Other Exercises Other Exercises: Ant. pelvic tilts(d/c after 5-10 reps secondary to peripheralization down legs) Other Exercises: Seated lumbar extension(10-20 reps )   Assessment/Plan    PT Assessment Patient needs continued PT services  PT Problem List Decreased strength;Decreased range of motion;Decreased activity tolerance;Decreased balance;Decreased mobility;Decreased coordination;Decreased knowledge of precautions;Decreased safety awareness;Decreased knowledge of use of DME;Pain;Impaired sensation       PT Treatment Interventions DME instruction;Gait training;Stair training;Functional mobility training;Therapeutic activities;Therapeutic exercise;Balance training;Patient/family education;Neuromuscular re-education;Wheelchair mobility training;Manual techniques;Modalities    PT Goals (Current goals can be found in the Care Plan section)  Acute Rehab PT Goals Patient Stated Goal: to improve low back pain PT Goal Formulation: With patient Time For Goal Achievement: 07/30/18 Potential to Achieve Goals: Good    Frequency Min 3X/week   Barriers to discharge        Co-evaluation               AM-PAC PT "6 Clicks" Daily Activity  Outcome Measure Difficulty turning over in bed  (including adjusting bedclothes, sheets and blankets)?: A Little Difficulty moving from lying on back to sitting on the side of the bed? : A Lot Difficulty sitting down on and standing up from a chair with arms (e.g., wheelchair, bedside commode, etc,.)?: A Little Help needed moving to and from a bed to chair (including a wheelchair)?: A Little Help needed walking in hospital room?: A Little Help needed climbing 3-5 steps with a railing? : A Lot 6 Click Score: 16    End of Session Equipment Utilized During Treatment: Gait belt;Other (comment)(R AFO) Activity Tolerance: Patient limited by pain Patient left: in chair;with call bell/phone within reach Nurse Communication: Mobility status PT Visit Diagnosis: Other abnormalities of gait and mobility (R26.89);Difficulty in walking, not elsewhere classified (R26.2);Muscle weakness (generalized) (M62.81);History of falling (Z91.81);Pain Pain - part of body: (lumbar )    Time: 2774-1287 PT Time Calculation (min) (ACUTE ONLY): 28 min   Charges:   PT Evaluation $PT Eval Moderate Complexity: 1 Mod PT Treatments $Gait Training: 8-22 mins        Einar Crow, [place]  Student Physical Therapist Acute Rehab [phone]   Einar Crow 07/15/2018, 1:33 PM

## 2018-07-15 NOTE — Progress Notes (Signed)
PROGRESS NOTE  [name] NWG:[id number] DOB: 1961-06-02 DOA: 07/12/2018 PCP: [name], [name], MD  HPI/Recap of past 24 hours:  Continue to have low back pain, pain radiating down to her buttock  She has chronic right foot numbness after surgery in 02/2018  She is currently sitting up in chair, family at bedside  Assessment/Plan: Principal Problem:   Back pain Active Problems:   Benign essential HTN   Hyperlipidemia   Generalized anxiety disorder with panic attacks   Difficulty in urination  Low Back pain after incidental fall: -h/o back surgery (redo L4-5 laminectomies and discectomy ) in 02/2018 -she presented with back pain 10/10 after a fall (she was trying to sit on a rolling wheelchair, when it slit out from underneath her and she fell on her back ) -mri of spine reviewed by ortho Dr Tonita Cong, per Dr Tonita Cong there is "A annular tear at L4-5 with a small central disc protrusion and neuroforaminal stenosis." , he recommended conservative management with steroids/analgesics/ muscle relaxer/neurontin/PT -she was in iv steroids, iv morphine initially -she is currently on scheduled oral prednisone/robaxin/neurotin/around clock ultram and prn percocet -case discussed with ortho dr Tonita Cong over the phone today, patient is to have PT eval today and tomorrow, likely able to d/c tomorrow.  Dr Tonita Cong will see patient in the office to assess the need for  an epidural steroid injection at L4-5.   Urinary difficulty -ua unremarkable -resolved  HTN/HLD Stable on home meds  CKDII Cr stable at baseline  Anemia on chronic disease hgb stable at baseline around 11.  Anxiety Continue Valium  Code Status: full  Family Communication: patient and family  Disposition Plan: home , likely in am   Consultants:  orthopedics Dr Tonita Cong  Procedures:  none  Antibiotics:  none   Objective: BP (!) 142/90 (BP Location: Left Arm)   Pulse 70   Temp (!) 97.5 F (36.4 C) (Oral)    Resp 20   Ht 5\' 5"  (1.651 m)   Wt 66.7 kg   SpO2 99%   BMI 24.46 kg/m   Intake/Output Summary (Last 24 hours) at 07/15/2018 1401 Last data filed at 07/15/2018 0900 Gross per 24 hour  Intake 1610.18 ml  Output -  Net 1610.18 ml   Filed Weights   07/12/18 1251 07/12/18 1727  Weight: 66.7 kg 66.7 kg    Exam: Patient is examined daily including today on 07/15/2018, exams remain the same as of yesterday except that has changed    General:  NAD  Cardiovascular: RRR  Respiratory: CTABL  Abdomen: Soft/ND/NT, positive BS  Musculoskeletal: low back tender to palpation, No Edema  Neuro: alert, oriented   Data Reviewed: Basic Metabolic Panel: Recent Labs  Lab 07/12/18 1709  NA 141  K 3.4*  CL 108  CO2 28  GLUCOSE 75  BUN 9  CREATININE 0.83  CALCIUM 8.0*   Liver Function Tests: No results for input(s): AST, ALT, ALKPHOS, BILITOT, PROT, ALBUMIN in the last 168 hours. No results for input(s): LIPASE, AMYLASE in the last 168 hours. No results for input(s): AMMONIA in the last 168 hours. CBC: Recent Labs  Lab 07/12/18 1709  WBC 4.9  NEUTROABS 2.2  HGB 11.3*  HCT 35.7*  MCV 91.3  PLT 238   Cardiac Enzymes:   No results for input(s): CKTOTAL, CKMB, CKMBINDEX, TROPONINI in the last 168 hours. BNP (last 3 results) No results for input(s): BNP in the last 8760 hours.  ProBNP (last 3 results) No results for  input(s): PROBNP in the last 8760 hours.  CBG: No results for input(s): GLUCAP in the last 168 hours.  No results found for this or any previous visit (from the past 240 hour(s)).   Studies: No results found.  Scheduled Meds: . atorvastatin  20 mg Oral Daily  . carvedilol  6.25 mg Oral BID  . enoxaparin (LOVENOX) injection  40 mg Subcutaneous Q24H  . estrogens (conjugated)  0.625 mg Oral Daily  . gabapentin  300 mg Oral TID  . polyethylene glycol  17 g Oral Daily  . predniSONE  10 mg Oral 3 x daily with food  . [START ON 07/16/2018] predniSONE  10 mg  Oral 4X daily taper  . predniSONE  20 mg Oral Nightly  . traMADol  50 mg Oral Q12H    Continuous Infusions: . sodium chloride Stopped (07/15/18 0245)  . methocarbamol (ROBAXIN) IV 500 mg (07/15/18 0606)     Time spent: 90mins, case discussed with Dr Tonita Cong over the phone I have personally reviewed and interpreted on  07/15/2018 daily labs,  imagings as discussed above under date review session and assessment and plans.  I reviewed all nursing notes, pharmacy notes, consultant notes,  vitals, pertinent old records  I have discussed plan of care as described above with RN , patient and family on 07/15/2018   Florencia Reasons MD, PhD  [facility] Pager [phone]. If 7PM-7AM, please contact night-coverage at [email or web address], password [facility] 07/15/2018, 2:01 PM  LOS: 1 day

## 2018-07-16 DIAGNOSIS — M549 Dorsalgia, unspecified: Secondary | ICD-10-CM

## 2018-07-16 DIAGNOSIS — G8929 Other chronic pain: Secondary | ICD-10-CM

## 2018-07-16 DIAGNOSIS — F410 Panic disorder [episodic paroxysmal anxiety] without agoraphobia: Secondary | ICD-10-CM

## 2018-07-16 DIAGNOSIS — M5416 Radiculopathy, lumbar region: Secondary | ICD-10-CM

## 2018-07-16 DIAGNOSIS — F411 Generalized anxiety disorder: Secondary | ICD-10-CM

## 2018-07-16 LAB — BASIC METABOLIC PANEL
Anion gap: 8 (ref 5–15)
BUN: 10 mg/dL (ref 6–20)
CO2: 30 mmol/L (ref 22–32)
Calcium: 9 mg/dL (ref 8.9–10.3)
Chloride: 100 mmol/L (ref 98–111)
Creatinine, Ser: 0.87 mg/dL (ref 0.44–1.00)
GFR calc Af Amer: >60 mL/min (ref >60)
GFR calc non Af Amer: >60 mL/min (ref >60)
Glucose, Bld: 132 mg/dL — ABNORMAL HIGH (ref 70–99)
Potassium: 4.5 mmol/L (ref 3.5–5.1)
Sodium: 138 mmol/L (ref 135–145)

## 2018-07-16 LAB — CBC
HCT: 39.9 % (ref 36.0–46.0)
Hemoglobin: 12.9 g/dL (ref 12.0–15.0)
MCH: 29.1 pg (ref 26.0–34.0)
MCHC: 32.3 g/dL (ref 30.0–36.0)
MCV: 90.1 fL (ref 78.0–100.0)
Platelets: 262 10*3/uL (ref 150–400)
RBC: 4.43 MIL/uL (ref 3.87–5.11)
RDW: 13.4 % (ref 11.5–15.5)
WBC: 17.4 10*3/uL — ABNORMAL HIGH (ref 4.0–10.5)

## 2018-07-16 LAB — MAGNESIUM: MAGNESIUM: 2 mg/dL (ref 1.7–2.4)

## 2018-07-16 MED ORDER — POLYETHYLENE GLYCOL 3350 17 G PO PACK
17.0000 g | PACK | Freq: Two times a day (BID) | ORAL | Status: DC
  Administered 2018-07-16 – 2018-07-20 (×8): 17 g via ORAL
  Filled 2018-07-16 (×8): qty 1

## 2018-07-16 MED ORDER — MINERAL OIL RE ENEM
1.0000 | ENEMA | Freq: Once | RECTAL | Status: AC
  Administered 2018-07-16: 1 via RECTAL
  Filled 2018-07-16: qty 1

## 2018-07-16 MED ORDER — INFLUENZA VAC SPLIT QUAD 0.5 ML IM SUSY: 0.5000 mL | PREFILLED_SYRINGE | INTRAMUSCULAR | Status: DC | PRN

## 2018-07-16 MED ORDER — BISACODYL 10 MG RE SUPP
10.0000 mg | Freq: Once | RECTAL | Status: AC
  Administered 2018-07-16: 10 mg via RECTAL
  Filled 2018-07-16: qty 1

## 2018-07-16 NOTE — Progress Notes (Signed)
Pt had a lot of visitors today, did not want her enema done until tonight.  Pt had hard stools today.

## 2018-07-16 NOTE — Plan of Care (Signed)
  Problem: Pain Managment: Goal: General experience of comfort will improve Outcome: Progressing   

## 2018-07-16 NOTE — Progress Notes (Signed)
Physical Therapy Treatment Patient Details Name: [name] MRN: [id number] DOB: Jul 13, 1961 Today's Date: 07/16/2018    History of Present Illness Pt is a 57 y.o. female presenting with back pain after a fall. PMH is significant for previous back surgery in May 2019, HLD, HTN, and anxiety.    PT Comments    Pt making slow progress towards goals. Pt performing transfers and ambulation with gross min A-min G at today's session secondary to increased pain and reports of being "woozy and lightheaded". Pt with LOB x1 with sit>stand, requiring mod assistance to recover. Monitored BP throughout which remained elevated (see vitals flow chart).Due to differences in functional presentation from previous session to today PT updating recommendations to CIR in order to maximize independence with mobility and improve overall strength prior to pt discharging home with family. Will continue to follow while admitted.    Follow Up Recommendations  Supervision/Assistance - 24 hour;CIR     Equipment Recommendations  None recommended by PT    Recommendations for Other Services       Precautions / Restrictions Precautions Precautions: Back Precaution Comments: Pt recalled 3/3 precautions; requiring cues throughout for bending Required Braces or Orthoses: (Has spinal brace at home, requested spouse bring to hospital) Restrictions Weight Bearing Restrictions: No    Mobility  Bed Mobility Overal bed mobility: Needs Assistance Bed Mobility: Rolling;Sidelying to Sit Rolling: Min guard Sidelying to sit: Min assist       General bed mobility comments: Min assist to elevate bil LE secondary to pain in back.  Pt continues to require min cues for log roll sequence to prevent twisting.  Transfers Overall transfer level: Needs assistance Equipment used: Rolling walker (2 wheeled) Transfers: Sit to/from Stand Sit to Stand: Min assist;Mod assist;Min guard         General transfer comment:  Min a - min g required for transfers upon standing secondary to reports of feeling "woozy" and lightheaded with LOBx1 requiring mod assistance to recover. VCs required for pt to keep her eyes open upon standing with onset of dizziness. Pt requiring cues for safe hand placement as pt attempted to pull to stand with RW.   Ambulation/Gait Ambulation/Gait assistance: Min guard Gait Distance (Feet): 35 Feet Assistive device: Rolling walker (2 wheeled) Gait Pattern/deviations: Step-to pattern;Decreased step length - right;Decreased dorsiflexion - right Gait velocity: decreased Gait velocity interpretation: <1.8 ft/sec, indicate of risk for recurrent falls General Gait Details: Pt will slow gait throughout session. MAFO donned prior to entry on R foot. Standing rest breaks needed throughout for pt to regain balance. Continues to require cues to increase knee flexion in order to prevent dragging R foot.    Stairs             Wheelchair Mobility    Modified Rankin (Stroke Patients Only)       Balance Overall balance assessment: Needs assistance Sitting-balance support: No upper extremity supported;Feet supported Sitting balance-Leahy Scale: Good     Standing balance support: Bilateral upper extremity supported;During functional activity Standing balance-Leahy Scale: Poor Standing balance comment: Reliant on BIL UE support                             Cognition Arousal/Alertness: Awake/alert Behavior During Therapy: WFL for tasks assessed/performed Overall Cognitive Status: Within Functional Limits for tasks assessed  General Comments General comments (skin integrity, edema, etc.): s/p ambulation BP =165/89 mmHg HR- 74 BPM (see vitals flow chart for BP reading prior to ambulation).      Pertinent Vitals/Pain Pain Assessment: 0-10 Pain Score: 8  Pain Location: Lumbar region Pain Descriptors / Indicators:  Discomfort;Guarding;Grimacing Pain Intervention(s): Limited activity within patient's tolerance;Monitored during session;Premedicated before session    Home Living                      Prior Function            PT Goals (current goals can now be found in the care plan section) Acute Rehab PT Goals Patient Stated Goal: to improve low back pain PT Goal Formulation: With patient Time For Goal Achievement: 07/30/18 Potential to Achieve Goals: Good Progress towards PT goals: Progressing toward goals    Frequency    Min 3X/week      PT Plan Current plan remains appropriate    Co-evaluation              AM-PAC PT "6 Clicks" Daily Activity  Outcome Measure  Difficulty turning over in bed (including adjusting bedclothes, sheets and blankets)?: A Little Difficulty moving from lying on back to sitting on the side of the bed? : Unable Difficulty sitting down on and standing up from a chair with arms (e.g., wheelchair, bedside commode, etc,.)?: Unable Help needed moving to and from a bed to chair (including a wheelchair)?: A Little Help needed walking in hospital room?: A Little Help needed climbing 3-5 steps with a railing? : A Lot 6 Click Score: 13    End of Session Equipment Utilized During Treatment: Gait belt;Other (comment)(R AFO) Activity Tolerance: Patient limited by pain;Patient limited by fatigue Patient left: in bed;with call bell/phone within reach Nurse Communication: Mobility status PT Visit Diagnosis: Other abnormalities of gait and mobility (R26.89);Difficulty in walking, not elsewhere classified (R26.2);Muscle weakness (generalized) (M62.81);History of falling (Z91.81);Pain;Dizziness and giddiness (R42) Pain - part of body: (lumbar )     Time: 9373-4287 PT Time Calculation (min) (ACUTE ONLY): 29 min  Charges:  $Gait Training: 8-22 mins $Therapeutic Activity: 8-22 mins                     Einar Crow, [place]  Student Physical Therapist Acute  Rehab [phone]    Einar Crow 07/16/2018, 10:52 AM

## 2018-07-16 NOTE — Progress Notes (Signed)
Inpatient Rehabilitation Admissions Coordinator  Please refer to Dr. Charm Barges consult. Lenna Sciara will follow up on Monday with her progress to assist with planning dispo. Her number is [phone].  Danne Baxter, RN, MSN Rehab Admissions Coordinator [phone] 07/16/2018 1:03 PM

## 2018-07-16 NOTE — Evaluation (Addendum)
Occupational Therapy Evaluation Patient Details Name: [name] MRN: [id number] DOB: 1961/04/13 Today's Date: 07/16/2018    History of Present Illness Pt is a 57 y.o. female presenting with back pain after a fall. PMH is significant for previous back surgery in May 2019, HLD, HTN, and anxiety.   Clinical Impression   PTA patient reports independent with ADLs and mobility since back surgery in May, and had recently returned to work on light duty when she fell.  She was admitted for above and is limited by pain, B LE weakness, R foot drop, impaired balance, precaution adherence, and decreased activity tolerance.  Patient currently requires setup assist for UB ADL seated, mod assist for LB bathing, max assist for LB dressing, mod assist for toileting, and mod assist for toilet transfers.  During session, she required mod assist to regain balance during toileting tasks and max assist to regain balance during mobility (L knee buckling); and reports increased fear of falling. Educated on safety, precautions, mobility and ADl compensatory techniques.  Based on performance today, believe she will best benefit from intensive CIR level therapies in order to maximize independence with ADls and mobility in order to return to PLOF.  Will continue to follow.     Follow Up Recommendations  CIR;Supervision/Assistance - 24 hour    Equipment Recommendations  None recommended by OT    Recommendations for Other Services Rehab consult     Precautions / Restrictions Precautions Precautions: Back Precaution Comments: Pt recalled 3/3 precautions; requiring cues throughout for bending Required Braces or Orthoses: (has spinal brace at home, spouse is bringing ) Restrictions Weight Bearing Restrictions: No      Mobility Bed Mobility Overal bed mobility: Needs Assistance Bed Mobility: Rolling;Sidelying to Sit;Sit to Sidelying Rolling: Min guard Sidelying to sit: Min guard     Sit to sidelying:  Mod assist General bed mobility comments: min guard to ensure log roll technique, modA to manage B LEs into bed due to back pain; verbal cueing to complete log roll  Transfers Overall transfer level: Needs assistance Equipment used: Rolling walker (2 wheeled) Transfers: Sit to/from Stand Sit to Stand: Mod assist;Min assist         General transfer comment: min assist to transfer from EOB, mod assist to transfer from [place] Overall balance assessment: Needs assistance Sitting-balance support: No upper extremity supported;Feet supported Sitting balance-Leahy Scale: Good     Standing balance support: During functional activity;No upper extremity supported Standing balance-Leahy Scale: Poor Standing balance comment: reliance on UE or external support                           ADL either performed or assessed with clinical judgement   ADL Overall ADL's : Needs assistance/impaired     Grooming: Wash/dry hands;Set up;Supervision/safety;Sitting   Upper Body Bathing: Supervision/ safety;Set up;Sitting   Lower Body Bathing: Sit to/from stand;Cueing for back precautions;Cueing for compensatory techniques;Cueing for safety;Moderate assistance Lower Body Bathing Details (indicate cue type and reason): assist for B feet and buttocks, min assist in standing  Upper Body Dressing : Set up;Sitting   Lower Body Dressing: Maximal assistance;Cueing for compensatory techniques;Cueing for safety;Cueing for back precautions;Sit to/from stand Lower Body Dressing Details (indicate cue type and reason): requires total assist for B shoes, cueing to adhere to back precautions; min assist in standing  Toilet Transfer: Moderate assistance;Ambulation;BSC;Regular Toilet;RW(3:1 over toilet ) Toilet Transfer Details (indicate cue type and reason): requires mod  assist to ascend from commode and maintain balance during transition Toileting- Clothing Manipulation and Hygiene: Moderate  assistance;Sit to/from stand;Cueing for compensatory techniques;Cueing for back precautions Toileting - Clothing Manipulation Details (indicate cue type and reason): requires assist for clothing management up and down due to impaired balacne     Functional mobility during ADLs: Minimal assistance;Rolling walker(grossly min assist, several LOB with mod-max A to recover ) General ADL Comments: [place] highly motivated, limited by pain in back, weakness, balance and fear of falling.  Noted several LOB during mobility and static standing requiring mod-max A to regain balance.      Vision Patient Visual Report: No change from baseline Vision Assessment?: No apparent visual deficits     Perception     Praxis      Pertinent Vitals/Pain Pain Assessment: Faces Pain Score: 8  Faces Pain Scale: Hurts whole lot Pain Location: Lumbar region Pain Descriptors / Indicators: Discomfort;Guarding;Grimacing Pain Intervention(s): Limited activity within patient's tolerance;Repositioned;Premedicated before session     Hand Dominance Right   Extremity/Trunk Assessment Upper Extremity Assessment Upper Extremity Assessment: Overall WFL for tasks assessed   Lower Extremity Assessment Lower Extremity Assessment: Defer to PT evaluation   Cervical / Trunk Assessment Cervical / Trunk Assessment: Other exceptions Cervical / Trunk Exceptions: Disc protrustion @ L2-3 with annular tear   Communication Communication Communication: No difficulties   Cognition Arousal/Alertness: Awake/alert Behavior During Therapy: WFL for tasks assessed/performed;Anxious Overall Cognitive Status: Within Functional Limits for tasks assessed                                 General Comments: become anxious with pain and fear of falling during mobiltiy   General Comments  pt reports lightheadedness during mobility, but fades during session     Exercises Other Exercises Other Exercises: (d/c after 5-10 reps  secondary to peripheralization down legs) Other Exercises: (10-20 reps )   Shoulder Instructions      Home Living Family/patient expects to be discharged to:: Private residence Living Arrangements: Spouse/significant other Available Help at Discharge: Family;Available 24 hours/day Type of Home: House Home Access: Stairs to enter [employer] of Steps: 4 Entrance Stairs-Rails: Right;Left;Can reach both Home Layout: Able to live on main level with bedroom/bathroom;One level     Bathroom Shower/Tub: Occupational psychologist: Standard Bathroom Accessibility: Yes   Home Equipment: Shower seat;Cane - single point;Walker - 2 wheels;Bedside commode          Prior Functioning/Environment Level of Independence: Independent with assistive device(s)        Comments: ambulated with RW secondary to R foot drop/numbness, independent ADLs         OT Problem List: Decreased strength;Decreased activity tolerance;Impaired balance (sitting and/or standing);Decreased safety awareness;Decreased knowledge of use of DME or AE;Decreased knowledge of precautions;Pain      OT Treatment/Interventions: Self-care/ADL training;Therapeutic exercise;Neuromuscular education;Energy conservation;DME and/or AE instruction;Therapeutic activities;Patient/family education;Balance training    OT Goals(Current goals can be found in the care plan section) Acute Rehab OT Goals Patient Stated Goal: to get stronger  OT Goal Formulation: With patient Time For Goal Achievement: 07/30/18 Potential to Achieve Goals: Good  OT Frequency: Min 2X/week   Barriers to D/C:            Co-evaluation              AM-PAC PT "6 Clicks" Daily Activity     Outcome Measure Help from another person  eating meals?: None Help from another person taking care of personal grooming?: A Little Help from another person toileting, which includes using toliet, bedpan, or urinal?: A Lot Help from another person  bathing (including washing, rinsing, drying)?: A Lot Help from another person to put on and taking off regular upper body clothing?: None Help from another person to put on and taking off regular lower body clothing?: A Lot 6 Click Score: 17   End of Session Equipment Utilized During Treatment: Gait belt;Rolling walker;Other (comment)(R AFO) Nurse Communication: Mobility status  Activity Tolerance: Patient tolerated treatment well Patient left: in bed;with call bell/phone within reach;with family/visitor present  OT Visit Diagnosis: Unsteadiness on feet (R26.81);Other abnormalities of gait and mobility (R26.89);Muscle weakness (generalized) (M62.81);History of falling (Z91.81);Pain Pain - part of body: (back)                Time: 3235-5732 OT Time Calculation (min): 24 min Charges:  OT General Charges $OT Visit: 1 Visit OT Evaluation $OT Eval Moderate Complexity: 1 Mod OT Treatments $Self Care/Home Management : 8-22 mins  Delight Stare, OT Acute Rehabilitation Services Pager [phone] Office [phone]   Delight Stare 07/16/2018, 11:10 AM

## 2018-07-16 NOTE — Progress Notes (Signed)
PROGRESS NOTE  [name] XIP:[id number] DOB: 1961-03-02 DOA: 07/12/2018 PCP: [name], [name], MD  HPI/Recap of past 24 hours:  She reports had a terrible night, low back pain got worse, pain radiating down to her buttock, her leg leg give out while trying to work with Pt this am  She has chronic right foot numbness after surgery in 02/2018  She reports being constipated, last bm was on monday  Assessment/Plan: Principal Problem:   Back pain Active Problems:   Benign essential HTN   Hyperlipidemia   Generalized anxiety disorder with panic attacks   Difficulty in urination  Low Back pain after incidental fall: -h/o back surgery (redo L4-5 laminectomies and discectomy ) in 02/2018 -she presented with back pain 10/10 after a fall (she was trying to sit on a rolling wheelchair, when it slit out from underneath her and she fell on her back ) -mri of spine reviewed by ortho Dr Tonita Cong, per Dr Tonita Cong there is "A annular tear at L4-5 with a small central disc protrusion and neuroforaminal stenosis." , he recommended conservative management with steroids/analgesics/ muscle relaxer/neurontin/PT -she was in iv steroids, iv morphine initially -she is currently on scheduled oral prednisone/robaxin/neurotin/around clock ultram and prn percocet -she is not progressing with PT her pain got worse, CIR placement recommended -case discussed with ortho, ortho to decide the need for an epidural steroid injection at L4-5. -cir consulted   Urinary difficulty -ua unremarkable -resolved  Constipation: miralax, senokot, enema  HTN/HLD Stable on home meds  CKDII Cr stable at baseline  Anemia on chronic disease hgb stable at baseline around 11.  Anxiety Continue Valium  Code Status: full  Family Communication: patient and family  Disposition Plan: CIR eval pending   Consultants:  orthopedics Dr Tonita Cong  Procedures:  none  Antibiotics:  none   Objective: BP (!) 163/94  (BP Location: Left Arm)   Pulse 63   Temp 98 F (36.7 C) (Oral)   Resp 18   Ht 5\' 5"  (1.651 m)   Wt 66.7 kg   SpO2 100%   BMI 24.46 kg/m   Intake/Output Summary (Last 24 hours) at 07/16/2018 1030 Last data filed at 07/15/2018 1700 Gross per 24 hour  Intake 240 ml  Output -  Net 240 ml   Filed Weights   07/12/18 1251 07/12/18 1727  Weight: 66.7 kg 66.7 kg    Exam: Patient is examined daily including today on 07/16/2018, exams remain the same as of yesterday except that has changed    General:  NAD  Cardiovascular: RRR  Respiratory: CTABL  Abdomen: Soft/ND/NT, positive BS  Musculoskeletal: low back tender to palpation, No Edema  Neuro: alert, oriented   Data Reviewed: Basic Metabolic Panel: Recent Labs  Lab 07/12/18 1709 07/16/18 0420  NA 141 138  K 3.4* 4.5  CL 108 100  CO2 28 30  GLUCOSE 75 132*  BUN 9 10  CREATININE 0.83 0.87  CALCIUM 8.0* 9.0  MG  --  2.0   Liver Function Tests: No results for input(s): AST, ALT, ALKPHOS, BILITOT, PROT, ALBUMIN in the last 168 hours. No results for input(s): LIPASE, AMYLASE in the last 168 hours. No results for input(s): AMMONIA in the last 168 hours. CBC: Recent Labs  Lab 07/12/18 1709 07/16/18 0420  WBC 4.9 17.4*  NEUTROABS 2.2  --   HGB 11.3* 12.9  HCT 35.7* 39.9  MCV 91.3 90.1  PLT 238 262   Cardiac Enzymes:   No results for input(s):  CKTOTAL, CKMB, CKMBINDEX, TROPONINI in the last 168 hours. BNP (last 3 results) No results for input(s): BNP in the last 8760 hours.  ProBNP (last 3 results) No results for input(s): PROBNP in the last 8760 hours.  CBG: No results for input(s): GLUCAP in the last 168 hours.  No results found for this or any previous visit (from the past 240 hour(s)).   Studies: No results found.  Scheduled Meds: . atorvastatin  20 mg Oral Daily  . bisacodyl  10 mg Rectal Once  . carvedilol  6.25 mg Oral BID  . enoxaparin (LOVENOX) injection  40 mg Subcutaneous Q24H  .  estrogens (conjugated)  0.625 mg Oral Daily  . gabapentin  300 mg Oral TID  . methocarbamol  500 mg Oral TID  . mineral oil  1 enema Rectal Once  . polyethylene glycol  17 g Oral BID  . predniSONE  10 mg Oral 4X daily taper  . senna-docusate  1 tablet Oral BID  . traMADol  50 mg Oral Q12H    Continuous Infusions: . sodium chloride Stopped (07/15/18 0245)     Time spent: 34mins, case discussed with ortho PT  over the phone, case discussed with PT I have personally reviewed and interpreted on  07/16/2018 daily labs,  imagings as discussed above under date review session and assessment and plans.  I reviewed all nursing notes, pharmacy notes, consultant notes,  vitals, pertinent old records  I have discussed plan of care as described above with RN , patient and family on 07/16/2018   Florencia Reasons MD, PhD  [facility] Pager [phone]. If 7PM-7AM, please contact night-coverage at [email or web address], password [facility] 07/16/2018, 10:30 AM  LOS: 2 days

## 2018-07-16 NOTE — Progress Notes (Signed)
Subjective:   4 days s/p fall and reinjury. Reports pain overnight back and leg, sat in her chair for 3 hrs yesterday which made her back stiff. Awaiting PT for CIR consult. Stayed at CIR before, rehabbed well post-op. Patient reports pain as moderate.    Objective: Vital signs in last 24 hours: Temp:  [97.8 F (36.6 C)-98 F (36.7 C)] 98 F (36.7 C) (09/20 0342) Pulse Rate:  [59-66] 63 (09/20 0342) Resp:  [16-18] 18 (09/20 0342) BP: (150-174)/(81-94) 163/94 (09/20 0342) SpO2:  [99 %-100 %] 100 % (09/20 0342)  Intake/Output from previous day: 09/19 0701 - 09/20 0700 In: 440 [P.O.:440] Out: -  Intake/Output this shift: No intake/output data recorded.  Recent Labs    07/16/18 0420  HGB 12.9   Recent Labs    07/16/18 0420  WBC 17.4*  RBC 4.43  HCT 39.9  PLT 262   Recent Labs    07/16/18 0420  NA 138  K 4.5  CL 100  CO2 30  BUN 10  CREATININE 0.87  GLUCOSE 132*  CALCIUM 9.0   No results for input(s): LABPT, INR in the last 72 hours.  Neurologically intact ABD soft Neurovascular intact Sensation intact distally Intact pulses distally Dorsiflexion/Plantar flexion intact No cellulitis present Compartment soft MAFO brace R ankle. L full PF/DF/EHL strength. R chronic weakness noted with DF. No calf pain or sign of DVT. Decreased sensation R foot plantar and dorsal surface.  Assessment/Plan:   back and leg pain s/p fall 4 days ago, lumbar radiculitis. Prior hx lumbar decompression earlier this year by [name] for spinal stenosis with residual RLE weakness and numbness, wearing MAFO. Advance diet Up with therapy CIR eval pending Would recommend avoiding sitting for 3 hrs in a row as can increase stiffness and pressure on disc/nerve. Should change positions often, discussed with pt Discussed with [name] Could consider ESI if not improving with other conservative tx  [name] M. 07/16/2018, 7:52 AM

## 2018-07-16 NOTE — Progress Notes (Signed)
   07/16/18 1000  PT General Charges  $$ ACUTE PT VISIT 1 Visit  PT Treatments  $Gait Training 8-22 mins  $Therapeutic Activity 8-22 mins  Sherie Don, [place], DPT  Acute Rehabilitation Services Pager [phone] Office [phone]

## 2018-07-16 NOTE — Consult Note (Signed)
Physical Medicine and Rehabilitation Consult Reason for Consult: Decreased functional mobility Referring Physician: [facility]   HPI: [name] is a 57 y.o. right-handed female with history of hypertension, chronic back pain with history of lumbar decompression May 2019 receiving inpatient rehab services for radiculopathy.Lives with spouse.Used a cane PTA and right AFO.  Presented 07/12/2018 by report patient was at work trying to sit on a rolling wheelchair when it slid out from under her and she fell to the floor landing on her buttocks.  Denied loss of consciousness.  MRI lumbar spine showed no fracture or malalignment L2-3 and L4-5 neural foraminal narrowing.  Noted small annular tear at L4-5 with a small central disc protrusion.  CT thoracic spine unremarkable.  Follow-up reviewed by [name] advise conservative care.  Hospital course pain management.  Subcutaneous Lovenox for DVT prophylaxis.  Therapy evaluations completed and ongoing.  MD requested physical medicine rehab consult.   Review of Systems  Constitutional: Negative for chills and fever.  HENT: Negative for hearing loss.   Eyes: Negative for blurred vision and double vision.  Respiratory: Negative for cough and shortness of breath.   Cardiovascular: Negative for chest pain, palpitations and leg swelling.  Gastrointestinal: Positive for constipation. Negative for nausea and vomiting.  Genitourinary: Negative for dysuria, flank pain and hematuria.  Musculoskeletal: Positive for back pain and myalgias.  Skin: Negative for rash.  Neurological: Positive for headaches.  Psychiatric/Behavioral:       Anxiety  All other systems reviewed and are negative.  Past Medical History:  Diagnosis Date  . Anxiety   . Back pain    related to spinal stenosis and disc problem, radiates down left buttocks to leg., weakness occ.  [name] Dyspnea   . Headache   . Hyperlipidemia   . Hypertension   . PONV (postoperative nausea and  vomiting)   . Vaginal foreign object    "Uses Femring"   Past Surgical History:  Procedure Laterality Date  . ABDOMINAL HYSTERECTOMY    . CARDIAC CATHETERIZATION N/A 04/18/2015   Procedure: Left Heart Cath and Coronary Angiography;  Surgeon: [name], MD;  Location: [place] [facility];  Service: Cardiovascular;  Laterality: N/A;  . FOOT SURGERY Bilateral    Brinkley "bunion,bone spur, tendon" (1) -6'16, (1)-10'16  . LUMBAR LAMINECTOMY/DECOMPRESSION MICRODISCECTOMY Bilateral 12/28/2015   Procedure: MICRO LUMBAR DECOMPRESSION L4 - L5 BILATERALLY;  Surgeon: [name], MD;  Location: [facility];  Service: Orthopedics;  Laterality: Bilateral;  . LUMBAR LAMINECTOMY/DECOMPRESSION MICRODISCECTOMY Bilateral 03/04/2018   Procedure: Revision of Microlumbar Decompression Bilateral Lumbar Four-Five;  Surgeon: [name], MD;  Location: [place];  Service: Orthopedics;  Laterality: Bilateral;  90 mins  . WISDOM TOOTH EXTRACTION    . WOUND EXPLORATION N/A 03/04/2018   Procedure: EXPLORATION OF LUMBAR DECOMPRESSION WOUND;  Surgeon: [name], MD;  Location: [place];  Service: Orthopedics;  Laterality: N/A;   Family History  Problem Relation Age of Onset  . Heart attack Mother   . Lung cancer Father   . Cancer Father   . Pancreatic cancer Sister   . Breast cancer Sister 92  . Throat cancer Brother   . Multiple myeloma Sister   . Breast cancer Sister        diagnosed in her 74's  . Heart attack Sister   . Stomach cancer Cousin   . Colon cancer Neg Hx    Social History:  reports that she has never smoked. She has never used  smokeless tobacco. She reports that she does not drink alcohol or use drugs. Allergies:  Allergies  Allergen Reactions  . Cephalosporins Anaphylaxis  . Penicillins Anaphylaxis and Hives    Has patient had a PCN reaction causing immediate rash, facial/tongue/throat swelling, SOB or lightheadedness with hypotension: Yes Has patient had a PCN reaction causing severe  rash involving mucus membranes or skin necrosis: Yes Has patient had a PCN reaction that required hospitalization Yes Has patient had a PCN reaction occurring within the last 10 years: No If all of the above answers are "NO", then may proceed with Cephalosporin use.   . Anesthetics, Amide Nausea And Vomiting    Does not know name of it. States they put it on record foot center.   [name] Peach [Prunus Persica] Hives  . Latex Hives, Itching and Rash   Medications Prior to Admission  Medication Sig Dispense Refill  . acetaminophen (TYLENOL) 325 MG tablet Take 1-2 tablets (325-650 mg total) by mouth every 4 (four) hours as needed for mild pain.    [name] acidophilus (RISAQUAD) CAPS capsule Take 1 capsule by mouth daily.    [name] ALPRAZolam (XANAX) 0.25 MG tablet Take 1 tablet (0.25 mg total) by mouth daily as needed for anxiety or sleep. 5 tablet 0  . atorvastatin (LIPITOR) 20 MG tablet Take 1 tablet (20 mg total) by mouth daily. 90 tablet 3  . carvedilol (COREG) 6.25 MG tablet TAKE 1 TABLET BY MOUTH 2 TIMES DAILY. (Patient taking differently: Take 6.25 mg by mouth 2 (two) times daily with a meal. ) 180 tablet 1  . docusate sodium (COLACE) 100 MG capsule Take 1 capsule (100 mg total) by mouth 2 (two) times daily. (Patient taking differently: Take 100 mg by mouth 2 (two) times daily as needed for mild constipation. ) 60 capsule 1  . EPINEPHrine 0.3 mg/0.3 mL IJ SOAJ injection Inject 0.3 mg into the muscle once. For severe allergic reaction    . gabapentin (NEURONTIN) 300 MG capsule Take 1 capsule (300 mg total) by mouth 3 (three) times daily. 90 capsule 4  . LORazepam (ATIVAN) 0.5 MG tablet Take 0.5-1 tablets (0.25-0.5 mg total) by mouth daily as needed for anxiety. 30 tablet 1  . methocarbamol (ROBAXIN) 500 MG tablet Take 1 tablet (500 mg total) by mouth every 6 (six) hours as needed for muscle spasms. 120 tablet 2  . polyethylene glycol (MIRALAX / GLYCOLAX) packet Take 17 g by mouth daily. 30 each 0  .  PREMARIN 0.625 MG tablet Take 0.625 mg by mouth daily.  7  . traMADol (ULTRAM) 50 MG tablet Take 1 tablet (50 mg total) by mouth every 6 (six) hours as needed for moderate pain. 21 tablet 0    Home: Home Living Family/patient expects to be discharged to:: Private residence Living Arrangements: Spouse/significant other Available Help at Discharge: Family, Available 24 hours/day Type of Home: House Home Access: Stairs to enter [employer] of Steps: 4 Entrance Stairs-Rails: Right, Left, Can reach both Home Layout: Able to live on main level with bedroom/bathroom, One level Bathroom Shower/Tub: Multimedia programmer: Standard Home Equipment: Civil engineer, contracting, Radio producer - single point, Environmental consultant - 2 wheels  Functional History: Prior Function Level of Independence: Independent with assistive device(s) Comments: ambulated with RW secondary to R foot drop/numbness Functional Status:  Mobility: Bed Mobility Overal bed mobility: Needs Assistance Bed Mobility: Rolling, Sidelying to Sit Rolling: Min guard Sidelying to sit: Min assist General bed mobility comments: Min assist to elevate bil LE secondary  to pain in back.  Pt continues to require min cues for log roll sequence to prevent twisting. Transfers Overall transfer level: Needs assistance Equipment used: Rolling walker (2 wheeled) Transfers: Sit to/from Stand Sit to Stand: Min assist, Mod assist, Min guard General transfer comment: Min a - min g required for transfers upon standing secondary to reports of feeling "woozy" and lightheaded with LOBx1 requiring mod assistance to recover. VCs required for pt to keep her eyes open upon standing with onset of dizziness. Pt requiring cues for safe hand placement as pt attempted to pull to stand with RW.  Ambulation/Gait Ambulation/Gait assistance: Min guard Gait Distance (Feet): 35 Feet Assistive device: Rolling walker (2 wheeled) Gait Pattern/deviations: Step-to pattern, Decreased  step length - right, Decreased dorsiflexion - right General Gait Details: Pt will slow gait throughout session. MAFO donned prior to entry on R foot. Continues to require cues to increase knee flexion in order to prevent dragging R foot.  Gait velocity: decreased Gait velocity interpretation: <1.8 ft/sec, indicate of risk for recurrent falls    ADL:    Cognition: Cognition Overall Cognitive Status: Within Functional Limits for tasks assessed Orientation Level: Oriented X4 Cognition Arousal/Alertness: Awake/alert Behavior During Therapy: WFL for tasks assessed/performed Overall Cognitive Status: Within Functional Limits for tasks assessed  Blood pressure (!) 163/94, pulse 63, temperature 98 F (36.7 C), temperature source Oral, resp. rate 18, height '5\' 5"'$  (1.651 m), weight 66.7 kg, SpO2 100 %. Physical Exam  Constitutional: She appears well-developed and well-nourished.  HENT:  Head: Normocephalic and atraumatic.  Eyes: EOM are normal.  Neck: Normal range of motion. No thyromegaly present.  Cardiovascular: Normal rate and regular rhythm.  Respiratory: Effort normal and breath sounds normal. No respiratory distress. She has no wheezes.  GI: Soft. She exhibits no distension.  Musculoskeletal:  Low back TTP. Side lying to left in bed. Both legs without edema. Does have back pain with attempts at PROM of hips and lower legs  Neurological: She is alert.  UE 5/5. LLE 2/5 prox to 1/5 distal with inconstent effort, RLE 2-3/5 prox to 1/5 distally with inconsistent effort. Sensory exam variable in both legs. .  Skin: Skin is warm and dry.  Psychiatric: Judgment and thought content normal.  Anxious but pleasant    Results for orders placed or performed during the hospital encounter of 07/12/18 (from the past 24 hour(s))  CBC     Status: Abnormal   Collection Time: 07/16/18  4:20 AM  Result Value Ref Range   WBC 17.4 (H) 4.0 - 10.5 K/uL   RBC 4.43 3.87 - 5.11 MIL/uL   Hemoglobin 12.9  12.0 - 15.0 g/dL   HCT 39.9 36.0 - 46.0 %   MCV 90.1 78.0 - 100.0 fL   MCH 29.1 26.0 - 34.0 pg   MCHC 32.3 30.0 - 36.0 g/dL   RDW 13.4 11.5 - 15.5 %   Platelets 262 150 - 400 K/uL  Basic metabolic panel     Status: Abnormal   Collection Time: 07/16/18  4:20 AM  Result Value Ref Range   Sodium 138 135 - 145 mmol/L   Potassium 4.5 3.5 - 5.1 mmol/L   Chloride 100 98 - 111 mmol/L   CO2 30 22 - 32 mmol/L   Glucose, Bld 132 (H) 70 - 99 mg/dL   BUN 10 6 - 20 mg/dL   Creatinine, Ser 0.87 0.44 - 1.00 mg/dL   Calcium 9.0 8.9 - 10.3 mg/dL   GFR calc non Af  Amer >60 >60 mL/min   GFR calc Af Amer >60 >60 mL/min   Anion gap 8 5 - 15  Magnesium     Status: None   Collection Time: 07/16/18  4:20 AM  Result Value Ref Range   Magnesium 2.0 1.7 - 2.4 mg/dL   No results found.   Assessment/Plan: Diagnosis: Pt with chronic lumbar radiculopathy and foraminal stenosis bilaterally. Recent fall with exacerbation of pain and motor/sensory deficits in legs. She is well known to me and struggles with significant anxiety as well.   I have personally reviewed all pertinent radiographical images, tests, and lab work which pertain to the above diagnosis.    1. Does the need for close, 24 hr/day medical supervision in concert with the patient's rehab needs make it unreasonable for this patient to be served in a less intensive setting? No and Potentially 2. Co-Morbidities requiring supervision/potential complications: pain, anxiety 3. Due to bladder management, bowel management, safety, skin/wound care, disease management, medication administration, pain management and patient education, does the patient require 24 hr/day rehab nursing? Potentially 4. Does the patient require coordinated care of a physician, rehab nurse, PT (1-2 hrs/day, 5 days/week) and OT (1-2 hrs/day, 5 days/week) to address physical and functional deficits in the context of the above medical diagnosis(es)? Potentially Addressing deficits in  the following areas: balance, endurance, locomotion, strength, transferring, bowel/bladder control, bathing, dressing, feeding, grooming, toileting and psychosocial support 5. Can the patient actively participate in an intensive therapy program of at least 3 hrs of therapy per day at least 5 days per week? Potentially 6. The potential for patient to make measurable gains while on inpatient rehab is fair 7. Anticipated functional outcomes upon discharge from inpatient rehab are modified independent  with PT, modified independent with OT, n/a with SLP. 8. Estimated rehab length of stay to reach the above functional goals is: TBD 9. Anticipated D/C setting: Home 10. Anticipated post D/C treatments: [facility] therapy 11. Overall Rehab/Functional Prognosis: excellent  RECOMMENDATIONS: This patient's condition is appropriate for continued rehabilitative care in the following setting: see below Patient has agreed to participate in recommended program. Yes Note that insurance prior authorization may be required for reimbursement for recommended care.  Comment: I know Mrs. Kasa very well and have been following her as an outpatient. MRI is not impressive for any acute changes. Suspect she has acute inflammation of her chronic lumbar radiculopathies. Also she struggles with anxiety which often can affect her functionally. Agree with po steroids, treatment of neuropathic pain, use of muscle relaxants, etc. Need to mobilize her too over the weekend. I would expect that with this treatment and some positive reinforcement, that she should be able to return home. If she is still struggling Monday, we could consider a rehab admission.   I have personally performed a face to face diagnostic evaluation of this patient and formulated the key components of the plan.  Additionally, I have personally reviewed laboratory data, imaging studies, as well as relevant notes and concur with the physician assistant's documentation  above.  Meredith Staggers, MD, Mellody Drown  07/16/2018   Lavon Paganini Senath, PA-C 07/16/2018

## 2018-07-17 LAB — TROPONIN I

## 2018-07-17 NOTE — Plan of Care (Signed)
  Problem: Education: Goal: Knowledge of General Education information will improve Description Including pain rating scale, medication(s)/side effects and non-pharmacologic comfort measures Outcome: Progressing   

## 2018-07-17 NOTE — Progress Notes (Signed)
Physical Therapy Treatment Patient Details Name: [name] MRN: [id number] DOB: 1961-07-27 Today's Date: 07/17/2018    History of Present Illness Pt is a 57 y.o. female presenting with back pain after a fall. PMH is significant for previous back surgery in May 2019, HLD, HTN, and anxiety.    PT Comments    Pt remains limited secondary to back pain and R LE weakness. Pt continues to require min A overall for transfers and ambulation with RW. She also requires mod A frequently with LOB posteriorly during static standing. Feel pt would greatly benefit from further intensive therapy services at CIR prior to returning home. PT will continue to follow acutely to progress mobility as tolerated.    Follow Up Recommendations  CIR;Supervision/Assistance - 24 hour     Equipment Recommendations  None recommended by PT    Recommendations for Other Services       Precautions / Restrictions Precautions Precautions: Back Precaution Comments: Pt recalled 3/3 precautions; requiring cues throughout for bending Required Braces or Orthoses: Spinal Brace Spinal Brace: Other (comment) Spinal Brace Comments: other lumbar brace from home Restrictions Weight Bearing Restrictions: No    Mobility  Bed Mobility Overal bed mobility: Needs Assistance Bed Mobility: Rolling;Sidelying to Sit;Sit to Sidelying Rolling: Supervision Sidelying to sit: Supervision     Sit to sidelying: Min assist General bed mobility comments: increased time and effort, log roll technique, min A to assist with bilateral LEs to return to bed  Transfers Overall transfer level: Needs assistance Equipment used: Rolling walker (2 wheeled) Transfers: Sit to/from Stand Sit to Stand: Mod assist;Min assist         General transfer comment: min A for initial standing from EOB x2 and from [facility] x1; however, mod A required as pt with LOB posteriorly with each stand and mod A needed to maintain upright  position  Ambulation/Gait Ambulation/Gait assistance: Min assist Gait Distance (Feet): 20 Feet Assistive device: Rolling walker (2 wheeled) Gait Pattern/deviations: Step-to pattern;Decreased step length - right;Decreased dorsiflexion - right Gait velocity: decreased Gait velocity interpretation: <1.31 ft/sec, indicative of household ambulator General Gait Details: pt with slow, cautious and guarded gait with R knee instability throughout with PT providing min A to R knee from buckling in stance phase   Stairs             Wheelchair Mobility    Modified Rankin (Stroke Patients Only)       Balance Overall balance assessment: Needs assistance Sitting-balance support: No upper extremity supported;Feet supported Sitting balance-Leahy Scale: Good     Standing balance support: During functional activity;No upper extremity supported Standing balance-Leahy Scale: Poor                              Cognition Arousal/Alertness: Awake/alert Behavior During Therapy: WFL for tasks assessed/performed;Anxious Overall Cognitive Status: Within Functional Limits for tasks assessed                                        Exercises      General Comments        Pertinent Vitals/Pain Pain Assessment: Faces Faces Pain Scale: Hurts whole lot Pain Location: Lumbar region Pain Descriptors / Indicators: Discomfort;Guarding;Grimacing Pain Intervention(s): Monitored during session;Repositioned    Home Living  Prior Function            PT Goals (current goals can now be found in the care plan section) Acute Rehab PT Goals PT Goal Formulation: With patient Time For Goal Achievement: 07/30/18 Potential to Achieve Goals: Good Progress towards PT goals: Progressing toward goals    Frequency    Min 3X/week      PT Plan Current plan remains appropriate    Co-evaluation              AM-PAC PT "6 Clicks" Daily  Activity  Outcome Measure  Difficulty turning over in bed (including adjusting bedclothes, sheets and blankets)?: A Little Difficulty moving from lying on back to sitting on the side of the bed? : A Little Difficulty sitting down on and standing up from a chair with arms (e.g., wheelchair, bedside commode, etc,.)?: Unable Help needed moving to and from a bed to chair (including a wheelchair)?: A Little Help needed walking in hospital room?: A Little Help needed climbing 3-5 steps with a railing? : A Lot 6 Click Score: 15    End of Session Equipment Utilized During Treatment: Gait belt Activity Tolerance: Patient limited by pain;Patient limited by fatigue Patient left: in bed;with call bell/phone within reach Nurse Communication: Mobility status PT Visit Diagnosis: Other abnormalities of gait and mobility (R26.89);Difficulty in walking, not elsewhere classified (R26.2);Muscle weakness (generalized) (M62.81);History of falling (Z91.81);Pain;Dizziness and giddiness (R42) Pain - part of body: (back)     Time: 1610-9604 PT Time Calculation (min) (ACUTE ONLY): 24 min  Charges:  $Therapeutic Activity: 23-37 mins                     Sherie Don, [place], DPT  Acute Rehabilitation Services Pager [phone] Office [place] 07/17/2018, 4:18 PM

## 2018-07-17 NOTE — Progress Notes (Signed)
Pt states she needs her "anxiety medication". States she believes her CP reported on night shift was r/t anxiety. States she takes ativan at home daily for anxiety. Requested ativan and given with effectiveness.

## 2018-07-17 NOTE — Progress Notes (Signed)
PROGRESS NOTE  [name] OVF:[id number] DOB: Feb 14, 1961 DOA: 07/12/2018 PCP: [name], [name], MD  HPI/Recap of past 24 hours:  She reports  low back pain slightly improved, pain still radiating down to her buttock and left leg She c/o c/o left leg being " so weak" her leg leg give out while trying to get up to use the bathroom  She has chronic right leg weakness and  foot numbness after surgery in 02/2018, she drag her right leg at baseline if she does not put on brace.  She has bm after enema  Sister at bedside  Assessment/Plan: Principal Problem:   Back pain Active Problems:   Benign essential HTN   Hyperlipidemia   Generalized anxiety disorder with panic attacks   Difficulty in urination  Low Back pain after incidental fall: -h/o back surgery (redo L4-5 laminectomies and discectomy ) in 02/2018 -she presented with back pain 10/10 after a fall (she was trying to sit on a rolling wheelchair, when it slit out from underneath her and she fell on her back ) -mri of spine reviewed by ortho Dr Tonita Cong, per Dr Tonita Cong there is "A annular tear at L4-5 with a small central disc protrusion and neuroforaminal stenosis." , he recommended conservative management with steroids/analgesics/ muscle relaxer/neurontin/PT -she was in iv steroids, iv morphine initially -she is currently on oral prednisone taper/scheduled robaxin tid /neurotin bid/ ultram bid and prn percocet -reports pain is better, left leg still very weak, right leg chronically weak and numbe -CIR reeval on monday -case discussed with ortho, ortho to decide the need for an epidural steroid injection at L4-5.   Leukocytosis Likely due to steroids, no sign of infection   Urinary difficulty -ua unremarkable -resolved  Constipation: miralax, senokot, enema resolved  HTN/HLD Stable on home meds  CKDII Cr stable at baseline  Anemia on chronic disease hgb stable at baseline around 11.  Anxiety Continue  Valium  Code Status: full  Family Communication: patient and family  Disposition Plan: CIR reeval pending   Consultants:  orthopedics Dr Tonita Cong  CIR  Procedures:  none  Antibiotics:  none   Objective: BP (!) 155/90 (BP Location: Right Arm)   Pulse 74   Temp 98 F (36.7 C) (Oral)   Resp 16   Ht 5\' 5"  (1.651 m)   Wt 66.7 kg   SpO2 96%   BMI 24.46 kg/m   Intake/Output Summary (Last 24 hours) at 07/17/2018 1454 Last data filed at 07/17/2018 0900 Gross per 24 hour  Intake 360 ml  Output -  Net 360 ml   Filed Weights   07/12/18 1251 07/12/18 1727  Weight: 66.7 kg 66.7 kg    Exam: Patient is examined daily including today on 07/17/2018, exams remain the same as of yesterday except that has changed    General:  NAD  Cardiovascular: RRR  Respiratory: CTABL  Abdomen: Soft/ND/NT, positive BS  Musculoskeletal: low back tender to palpation, No Edema, left leg able to lift against gravity at 30degree, right leg not able to lift against gravity   Neuro: alert, oriented   Data Reviewed: Basic Metabolic Panel: Recent Labs  Lab 07/12/18 1709 07/16/18 0420  NA 141 138  K 3.4* 4.5  CL 108 100  CO2 28 30  GLUCOSE 75 132*  BUN 9 10  CREATININE 0.83 0.87  CALCIUM 8.0* 9.0  MG  --  2.0   Liver Function Tests: No results for input(s): AST, ALT, ALKPHOS, BILITOT, PROT, ALBUMIN in the  last 168 hours. No results for input(s): LIPASE, AMYLASE in the last 168 hours. No results for input(s): AMMONIA in the last 168 hours. CBC: Recent Labs  Lab 07/12/18 1709 07/16/18 0420  WBC 4.9 17.4*  NEUTROABS 2.2  --   HGB 11.3* 12.9  HCT 35.7* 39.9  MCV 91.3 90.1  PLT 238 262   Cardiac Enzymes:   Recent Labs  Lab 07/17/18 0750  TROPONINI <0.03   BNP (last 3 results) No results for input(s): BNP in the last 8760 hours.  ProBNP (last 3 results) No results for input(s): PROBNP in the last 8760 hours.  CBG: No results for input(s): GLUCAP in the last 168  hours.  No results found for this or any previous visit (from the past 240 hour(s)).   Studies: No results found.  Scheduled Meds: . atorvastatin  20 mg Oral Daily  . carvedilol  6.25 mg Oral BID  . enoxaparin (LOVENOX) injection  40 mg Subcutaneous Q24H  . estrogens (conjugated)  0.625 mg Oral Daily  . gabapentin  300 mg Oral TID  . methocarbamol  500 mg Oral TID  . polyethylene glycol  17 g Oral BID  . predniSONE  10 mg Oral 4X daily taper  . senna-docusate  1 tablet Oral BID  . traMADol  50 mg Oral Q12H    Continuous Infusions: . sodium chloride Stopped (07/15/18 0245)     Time spent: 56mins,  I have personally reviewed and interpreted on  07/17/2018 daily labs,  imagings as discussed above under date review session and assessment and plans.  I reviewed all nursing notes, pharmacy notes, consultant notes,  vitals, pertinent old records  I have discussed plan of care as described above with RN , patient and family on 07/17/2018   Florencia Reasons MD, PhD  [facility] Pager [phone]. If 7PM-7AM, please contact night-coverage at [email or web address], password [facility] 07/17/2018, 2:54 PM  LOS: 3 days

## 2018-07-17 NOTE — Progress Notes (Signed)
Subjective:   5days s/p fall and reinjury. Reports pain overnight back and leg,better today , still very weak Patient reports pain as moderate.    Objective: Vital signs in last 24 hours: Temp:  [97.8 F (36.6 C)-98.4 F (36.9 C)] 98.4 F (36.9 C) (09/21 0825) Pulse Rate:  [56-80] 80 (09/21 0825) Resp:  [16] 16 (09/21 0825) BP: (149-176)/(81-90) 149/82 (09/21 0825) SpO2:  [97 %-99 %] 99 % (09/21 0825)  Intake/Output from previous day: 09/20 0701 - 09/21 0700 In: 120 [P.O.:120] Out: -  Intake/Output this shift: Total I/O In: 240 [P.O.:240] Out: -   Recent Labs    07/16/18 0420  HGB 12.9   Recent Labs    07/16/18 0420  WBC 17.4*  RBC 4.43  HCT 39.9  PLT 262   Recent Labs    07/16/18 0420  NA 138  K 4.5  CL 100  CO2 30  BUN 10  CREATININE 0.87  GLUCOSE 132*  CALCIUM 9.0   No results for input(s): LABPT, INR in the last 72 hours.  Neurologically intact ABD soft Neurovascular intact Sensation intact distally Intact pulses distally Dorsiflexion/Plantar flexion intact No cellulitis present Compartment soft MAFO brace R ankle. L full PF/DF/EHL strength. R chronic weakness noted with DF. No calf pain or sign of DVT. Decreased sensation R foot plantar and dorsal surface.  Assessment/Plan:   back and leg pain s/p fall 5 days ago, lumbar radiculitis. Prior hx lumbar decompression earlier this year by [name] for spinal stenosis with residual RLE weakness and numbness, wearing MAFO. Advance diet Up with therapy CIR eval pending Would recommend avoiding sitting for 3 hrs in a row as can increase stiffness and pressure on disc/nerve. Should change positions often, discussed with pt Continue PT/OT while awaiting CIR Some improvement now, may ultimately need ESI  Nicholes Stairs 07/17/2018, 9:50 AM

## 2018-07-18 NOTE — Plan of Care (Signed)

## 2018-07-18 NOTE — Progress Notes (Signed)
Orthopedics Progress Note  Subjective: Patient still complains of low back pain after recent fall. It helps if she changes positions  Objective:  Vitals:   07/17/18 2037 07/18/18 0459  BP: (!) 145/83 130/84  Pulse: 72 89  Resp: 16 16  Temp: (!) 97.4 F (36.3 C) 98.5 F (36.9 C)  SpO2: 99% 100%    General: Awake and alert  Musculoskeletal: remains tender in the lumbar area both the midline and the paraspinous muscles Right leg with AFO due to baseline footdrop Left LE with weak DF but decent plantarflexion of the ankle. Sensation intact. Compartments supple. No pain with Homan's Neurovascularly intact  Lab Results  Component Value Date   WBC 17.4 (H) 07/16/2018   HGB 12.9 07/16/2018   HCT 39.9 07/16/2018   MCV 90.1 07/16/2018   PLT 262 07/16/2018       Component Value Date/Time   NA 138 07/16/2018 0420   K 4.5 07/16/2018 0420   CL 100 07/16/2018 0420   CO2 30 07/16/2018 0420   GLUCOSE 132 (H) 07/16/2018 0420   BUN 10 07/16/2018 0420   CREATININE 0.87 07/16/2018 0420   CALCIUM 9.0 07/16/2018 0420   GFRNONAA >60 07/16/2018 0420   GFRAA >60 07/16/2018 0420    Lab Results  Component Value Date   INR 0.97 04/18/2015   INR 0.9 04/24/2009   INR 1.1 06/01/2007    Assessment/Plan: Lumbar low back pain due to stenosis.  No fractures on CT or MRI Await CIR consult and disposition per Dr Tonita Cong Continue mobilization as tolerated  Doran Heater. Veverly Fells, MD 07/18/2018 10:00 AM

## 2018-07-18 NOTE — Progress Notes (Signed)
PROGRESS NOTE  [name] [id number]:[id number] DOB: 1961-10-10 DOA: 07/12/2018 PCP: [name], [name], MD  HPI/Recap of past 24 hours:  She reports  low back pain , pain still radiating down to her buttock and left leg She c/o c/o left leg being " so weak" her left leg give out while trying to get up with physical therapy today  She has chronic right leg weakness and  foot numbness after surgery in 02/2018, she drag her right leg at baseline if she does not put on brace.  She is looking forward to get therapy at CIR to get stronger  Assessment/Plan: Principal Problem:   Back pain Active Problems:   Benign essential HTN   Hyperlipidemia   Generalized anxiety disorder with panic attacks   Difficulty in urination  Low Back pain after incidental fall: -h/o back surgery (redo L4-5 laminectomies and discectomy ) in 02/2018 -she presented with back pain 10/10 after a fall (she was trying to sit on a rolling wheelchair, when it slit out from underneath her and she fell on her back ) -mri of spine reviewed by ortho Dr Tonita Cong, per Dr Tonita Cong there is "A annular tear at L4-5 with a small central disc protrusion and neuroforaminal stenosis." , he recommended conservative management with steroids/analgesics/ muscle relaxer/neurontin/PT -she was in iv steroids, iv morphine initially -she is currently on oral prednisone taper/scheduled robaxin tid /neurotin bid/ ultram bid and prn percocet -reports pain is better, left leg still very weak, right leg chronically weak and numbe -CIR reeval on monday -case discussed with ortho, ortho to decide the need for an epidural steroid injection at L4-5.   Leukocytosis Likely due to steroids, no sign of infection   Urinary difficulty -ua unremarkable -resolved  Constipation: miralax, senokot, enema resolved  HTN/HLD Stable on home meds  CKDII Cr stable at baseline  Anemia on chronic disease hgb stable at baseline around 11.  Anxiety Continue  Valium  Code Status: full  Family Communication: patient   Disposition Plan: CIR reeval pending   Consultants:  orthopedics Dr Tonita Cong  CIR  Procedures:  none  Antibiotics:  none   Objective: BP (!) 142/83 (BP Location: Left Arm)   Pulse 82   Temp 97.6 F (36.4 C) (Oral)   Resp 16   Ht 5\' 5"  (1.651 m)   Wt 66.7 kg   SpO2 100%   BMI 24.46 kg/m   Intake/Output Summary (Last 24 hours) at 07/18/2018 1539 Last data filed at 07/18/2018 1500 Gross per 24 hour  Intake 720 ml  Output -  Net 720 ml   Filed Weights   07/12/18 1251 07/12/18 1727  Weight: 66.7 kg 66.7 kg    Exam: Patient is examined daily including today on 07/18/2018, exams remain the same as of yesterday except that has changed    General:  NAD  Cardiovascular: RRR  Respiratory: CTABL  Abdomen: Soft/ND/NT, positive BS  Musculoskeletal: low back tender to palpation, No Edema, left leg able to lift against gravity at 30degree, right leg not able to lift against gravity   Neuro: alert, oriented   Data Reviewed: Basic Metabolic Panel: Recent Labs  Lab 07/12/18 1709 07/16/18 0420  NA 141 138  K 3.4* 4.5  CL 108 100  CO2 28 30  GLUCOSE 75 132*  BUN 9 10  CREATININE 0.83 0.87  CALCIUM 8.0* 9.0  MG  --  2.0   Liver Function Tests: No results for input(s): AST, ALT, ALKPHOS, BILITOT, PROT, ALBUMIN in  the last 168 hours. No results for input(s): LIPASE, AMYLASE in the last 168 hours. No results for input(s): AMMONIA in the last 168 hours. CBC: Recent Labs  Lab 07/12/18 1709 07/16/18 0420  WBC 4.9 17.4*  NEUTROABS 2.2  --   HGB 11.3* 12.9  HCT 35.7* 39.9  MCV 91.3 90.1  PLT 238 262   Cardiac Enzymes:   Recent Labs  Lab 07/17/18 0750  TROPONINI <0.03   BNP (last 3 results) No results for input(s): BNP in the last 8760 hours.  ProBNP (last 3 results) No results for input(s): PROBNP in the last 8760 hours.  CBG: No results for input(s): GLUCAP in the last 168 hours.  No  results found for this or any previous visit (from the past 240 hour(s)).   Studies: No results found.  Scheduled Meds: . atorvastatin  20 mg Oral Daily  . carvedilol  6.25 mg Oral BID  . enoxaparin (LOVENOX) injection  40 mg Subcutaneous Q24H  . estrogens (conjugated)  0.625 mg Oral Daily  . gabapentin  300 mg Oral TID  . methocarbamol  500 mg Oral TID  . polyethylene glycol  17 g Oral BID  . predniSONE  10 mg Oral 4X daily taper  . senna-docusate  1 tablet Oral BID  . traMADol  50 mg Oral Q12H    Continuous Infusions: . sodium chloride Stopped (07/15/18 0245)     Time spent: 70mins,  I have personally reviewed and interpreted on  07/18/2018 daily labs,  imagings as discussed above under date review session and assessment and plans.  I reviewed all nursing notes, pharmacy notes, consultant notes,  vitals, pertinent old records  I have discussed plan of care as described above with RN , patient  on 07/18/2018   Florencia Reasons MD, PhD  [facility] Pager [phone]. If 7PM-7AM, please contact night-coverage at [email or web address], password [facility] 07/18/2018, 3:39 PM  LOS: 4 days

## 2018-07-18 NOTE — Progress Notes (Signed)
Physical Therapy Treatment Patient Details Name: [name] MRN: [id number] DOB: 08-25-1961 Today's Date: 07/18/2018    History of Present Illness Pt is a 57 y.o. female presenting with back pain after a fall. PMH is significant for previous back surgery in May 2019, HLD, HTN, and anxiety.    PT Comments    Pt continues to demonstrate significant mobility deficits due to pain and BLE weakness. She required min assist bed mobility, mod assist sit to stand, and min assist ambulation 10 feet with RW. R AFO donned sitting EOB prior to stance/ambulation. Continue to recommend CIR level therapies prior to d/c home.    Follow Up Recommendations  CIR;Supervision/Assistance - 24 hour     Equipment Recommendations  None recommended by PT    Recommendations for Other Services       Precautions / Restrictions Precautions Precautions: Back Precaution Comments: reviewed 3/3 back precautions Required Braces or Orthoses: Spinal Brace Spinal Brace: Other (comment) Spinal Brace Comments: other lumbar brace from home Restrictions Weight Bearing Restrictions: No    Mobility  Bed Mobility Overal bed mobility: Needs Assistance   Rolling: Supervision Sidelying to sit: Min assist       General bed mobility comments: +rail, increased time and effort, cues for logroll  Transfers Overall transfer level: Needs assistance Equipment used: Rolling walker (2 wheeled) Transfers: Sit to/from Stand Sit to Stand: Mod assist         General transfer comment: assist to power up, increased time to stabilize initial standing balance and achieve full, upright posture  Ambulation/Gait Ambulation/Gait assistance: Min assist Gait Distance (Feet): 10 Feet Assistive device: Rolling walker (2 wheeled) Gait Pattern/deviations: Step-to pattern;Decreased stride length Gait velocity: decreased Gait velocity interpretation: <1.31 ft/sec, indicative of household ambulator General Gait Details:  slow, guard gait; multi episodes of R knee buckling requiring min assist to maintain stance   Stairs             Wheelchair Mobility    Modified Rankin (Stroke Patients Only)       Balance Overall balance assessment: Needs assistance Sitting-balance support: No upper extremity supported;Feet supported Sitting balance-Leahy Scale: Good     Standing balance support: During functional activity;No upper extremity supported Standing balance-Leahy Scale: Poor Standing balance comment: reliance on UE or external support                            Cognition Arousal/Alertness: Awake/alert Behavior During Therapy: WFL for tasks assessed/performed;Anxious Overall Cognitive Status: Within Functional Limits for tasks assessed                                 General Comments: become anxious with pain and fear of falling during mobiltiy      Exercises      General Comments        Pertinent Vitals/Pain Pain Assessment: 0-10 Pain Score: 7  Pain Location: low back radiating down legs Pain Descriptors / Indicators: Radiating;Guarding;Grimacing Pain Intervention(s): Monitored during session;Limited activity within patient's tolerance;Repositioned    Home Living                      Prior Function            PT Goals (current goals can now be found in the care plan section) Acute Rehab PT Goals Patient Stated Goal: to get stronger  PT Goal Formulation:  With patient Time For Goal Achievement: 07/30/18 Potential to Achieve Goals: Good Progress towards PT goals: Progressing toward goals    Frequency    Min 3X/week      PT Plan Current plan remains appropriate    Co-evaluation              AM-PAC PT "6 Clicks" Daily Activity  Outcome Measure  Difficulty turning over in bed (including adjusting bedclothes, sheets and blankets)?: A Little Difficulty moving from lying on back to sitting on the side of the bed? : A  Lot Difficulty sitting down on and standing up from a chair with arms (e.g., wheelchair, bedside commode, etc,.)?: Unable Help needed moving to and from a bed to chair (including a wheelchair)?: A Little Help needed walking in hospital room?: A Little Help needed climbing 3-5 steps with a railing? : A Lot 6 Click Score: 14    End of Session Equipment Utilized During Treatment: Gait belt;Other (comment)(R AFO) Activity Tolerance: Patient limited by pain;Patient limited by fatigue Patient left: in chair;with call bell/phone within reach Nurse Communication: Mobility status PT Visit Diagnosis: Other abnormalities of gait and mobility (R26.89);Difficulty in walking, not elsewhere classified (R26.2);Muscle weakness (generalized) (M62.81);History of falling (Z91.81);Pain;Dizziness and giddiness (R42)     Time: 8185-6314 PT Time Calculation (min) (ACUTE ONLY): 21 min  Charges:  $Gait Training: 8-22 mins                     Lorrin Goodell, PT  Office # [phone] Pager [phone]    [name] 07/18/2018, 11:33 AM

## 2018-07-19 LAB — CREATININE, SERUM
Creatinine, Ser: 0.89 mg/dL (ref 0.44–1.00)
GFR calc Af Amer: >60 mL/min (ref >60)
GFR calc non Af Amer: >60 mL/min (ref >60)

## 2018-07-19 NOTE — H&P (Signed)
Physical Medicine and Rehabilitation Admission H&P    Chief Complaint  Patient presents with  . Fall  . Back Pain  : HPI: [name] is a 57 year old right-handed female history of hypertension, chronic back pain with history of lumbar decompression May 2019 by [name] receiving inpatient rehab services for radiculopathy.  She lives with spouse.  Used a cane prior to admission and a right AFO.  Presented 07/12/2018 by report patient was back at work part-time sitting on a rolling wheelchair when she slid out from under her and she fell to the floor landing on her buttocks.  Denied loss of consciousness.  MRI lumbar spine showed no fracture malalignment L2-3 and L4-5 neural foraminal narrowing.  Noted small annular tear at L4-5 with a small central disc protrusion.  CT thoracic spine unremarkable.  Follow-up orthopedic services [name] with conservative care.  Plan for ESI L4-5 due to ongoing pain per interventional radiology.  Hospital course ongoing pain management.  Therapy evaluations completed with recommendations of physical medicine rehab consult.  Patient was admitted for a comprehensive rehab program.  Review of Systems  Constitutional: Negative for fever.  HENT: Negative for hearing loss.   Eyes: Negative for blurred vision and double vision.  Respiratory: Negative for shortness of breath.   Cardiovascular: Negative for chest pain, palpitations and leg swelling.  Gastrointestinal: Positive for constipation. Negative for nausea and vomiting.  Musculoskeletal: Positive for back pain.  Skin: Negative for rash.  Neurological: Positive for headaches.  Psychiatric/Behavioral:       Anxiety  All other systems reviewed and are negative.  Past Medical History:  Diagnosis Date  . Anxiety   . Back pain    related to spinal stenosis and disc problem, radiates down left buttocks to leg., weakness occ.  [name] Dyspnea   . Headache   . Hyperlipidemia   . Hypertension   . PONV  (postoperative nausea and vomiting)   . Vaginal foreign object    "Uses Femring"   Past Surgical History:  Procedure Laterality Date  . ABDOMINAL HYSTERECTOMY    . CARDIAC CATHETERIZATION N/A 04/18/2015   Procedure: Left Heart Cath and Coronary Angiography;  Surgeon: [name], MD;  Location: [facility];  Service: Cardiovascular;  Laterality: N/A;  . FOOT SURGERY Bilateral    [place] "bunion,bone spur, tendon" (1) -6'16, (1)-10'16  . LUMBAR LAMINECTOMY/DECOMPRESSION MICRODISCECTOMY Bilateral 12/28/2015   Procedure: MICRO LUMBAR DECOMPRESSION L4 - L5 BILATERALLY;  Surgeon: [name], MD;  Location: [facility];  Service: Orthopedics;  Laterality: Bilateral;  . LUMBAR LAMINECTOMY/DECOMPRESSION MICRODISCECTOMY Bilateral 03/04/2018   Procedure: Revision of Microlumbar Decompression Bilateral Lumbar Four-Five;  Surgeon: [name], MD;  Location: [facility];  Service: Orthopedics;  Laterality: Bilateral;  90 mins  . WISDOM TOOTH EXTRACTION    . WOUND EXPLORATION N/A 03/04/2018   Procedure: EXPLORATION OF LUMBAR DECOMPRESSION WOUND;  Surgeon: [name], MD;  Location: [facility];  Service: Orthopedics;  Laterality: N/A;   Family History  Problem Relation Age of Onset  . Heart attack Mother   . Lung cancer Father   . Cancer Father   . Pancreatic cancer Sister   . Breast cancer Sister 68  . Throat cancer Brother   . Multiple myeloma Sister   . Breast cancer Sister        diagnosed in her 76's  . Heart attack Sister   . Stomach cancer Cousin   . Colon cancer Neg Hx    Social  History:  reports that she has never smoked. She has never used smokeless tobacco. She reports that she does not drink alcohol or use drugs. Allergies:  Allergies  Allergen Reactions  . Cephalosporins Anaphylaxis  . Penicillins Anaphylaxis and Hives    Has patient had a PCN reaction causing immediate rash, facial/tongue/throat swelling, SOB or lightheadedness with hypotension: Yes Has patient had a  PCN reaction causing severe rash involving mucus membranes or skin necrosis: Yes Has patient had a PCN reaction that required hospitalization Yes Has patient had a PCN reaction occurring within the last 10 years: No If all of the above answers are "NO", then may proceed with Cephalosporin use.   . Anesthetics, Amide Nausea And Vomiting    Does not know name of it. States they put it on record foot center.   [name] Peach [Prunus Persica] Hives  . Latex Hives, Itching and Rash   Medications Prior to Admission  Medication Sig Dispense Refill  . acetaminophen (TYLENOL) 325 MG tablet Take 1-2 tablets (325-650 mg total) by mouth every 4 (four) hours as needed for mild pain.    [name] acidophilus (RISAQUAD) CAPS capsule Take 1 capsule by mouth daily.    [name] ALPRAZolam (XANAX) 0.25 MG tablet Take 1 tablet (0.25 mg total) by mouth daily as needed for anxiety or sleep. 5 tablet 0  . atorvastatin (LIPITOR) 20 MG tablet Take 1 tablet (20 mg total) by mouth daily. 90 tablet 3  . carvedilol (COREG) 6.25 MG tablet TAKE 1 TABLET BY MOUTH 2 TIMES DAILY. (Patient taking differently: Take 6.25 mg by mouth 2 (two) times daily with a meal. ) 180 tablet 1  . docusate sodium (COLACE) 100 MG capsule Take 1 capsule (100 mg total) by mouth 2 (two) times daily. (Patient taking differently: Take 100 mg by mouth 2 (two) times daily as needed for mild constipation. ) 60 capsule 1  . EPINEPHrine 0.3 mg/0.3 mL IJ SOAJ injection Inject 0.3 mg into the muscle once. For severe allergic reaction    . gabapentin (NEURONTIN) 300 MG capsule Take 1 capsule (300 mg total) by mouth 3 (three) times daily. 90 capsule 4  . LORazepam (ATIVAN) 0.5 MG tablet Take 0.5-1 tablets (0.25-0.5 mg total) by mouth daily as needed for anxiety. 30 tablet 1  . methocarbamol (ROBAXIN) 500 MG tablet Take 1 tablet (500 mg total) by mouth every 6 (six) hours as needed for muscle spasms. 120 tablet 2  . polyethylene glycol (MIRALAX / GLYCOLAX) packet Take 17 g by mouth  daily. 30 each 0  . PREMARIN 0.625 MG tablet Take 0.625 mg by mouth daily.  7  . traMADol (ULTRAM) 50 MG tablet Take 1 tablet (50 mg total) by mouth every 6 (six) hours as needed for moderate pain. 21 tablet 0    Drug Regimen Review Drug regimen was reviewed and remains appropriate with no significant issues identified  Home: Home Living Family/patient expects to be discharged to:: Private residence Living Arrangements: Spouse/significant other Available Help at Discharge: Family, Available 24 hours/day Type of Home: House Home Access: Stairs to enter [employer] of Steps: 4 Entrance Stairs-Rails: Right, Left, Can reach both Home Layout: Able to live on main level with bedroom/bathroom, One level Bathroom Shower/Tub: Multimedia programmer: Standard Bathroom Accessibility: Yes Home Equipment: Shower seat, Cane - single point, Walker - 2 wheels, Bedside commode   Functional History: Prior Function Level of Independence: Independent with assistive device(s) Comments: ambulated with RW secondary to R foot drop/numbness, independent ADLs  Functional Status:  Mobility: Bed Mobility Overal bed mobility: Needs Assistance Bed Mobility: Rolling, Sidelying to Sit, Sit to Sidelying Rolling: Supervision Sidelying to sit: Min assist Sit to sidelying: Min assist General bed mobility comments: Pt received in recliner. Transfers Overall transfer level: Needs assistance Equipment used: Rolling walker (2 wheeled) Transfers: Sit to/from Stand Sit to Stand: Mod assist General transfer comment: assist to power up, increased time to stabilize initial standing balance and achieve full, upright posture Ambulation/Gait Ambulation/Gait assistance: Min assist Gait Distance (Feet): 10 Feet Assistive device: Rolling walker (2 wheeled) Gait Pattern/deviations: Step-to pattern, Decreased stride length General Gait Details: slow, guard gait; multi episodes of R knee buckling  requiring min assist to maintain stance Gait velocity: decreased Gait velocity interpretation: <1.31 ft/sec, indicative of household ambulator    ADL: ADL Overall ADL's : Needs assistance/impaired Grooming: Wash/dry hands, Set up, Supervision/safety, Sitting Upper Body Bathing: Supervision/ safety, Set up, Sitting Lower Body Bathing: Sit to/from stand, Cueing for back precautions, Cueing for compensatory techniques, Cueing for safety, Moderate assistance Lower Body Bathing Details (indicate cue type and reason): assist for B feet and buttocks, min assist in standing  Upper Body Dressing : Set up, Sitting Lower Body Dressing: Maximal assistance, Cueing for compensatory techniques, Cueing for safety, Cueing for back precautions, Sit to/from stand Lower Body Dressing Details (indicate cue type and reason): requires total assist for B shoes, cueing to adhere to back precautions; min assist in standing  Toilet Transfer: Moderate assistance, Ambulation, BSC, Regular Toilet, RW(3:1 over toilet ) Toilet Transfer Details (indicate cue type and reason): requires mod assist to ascend from commode and maintain balance during transition Toileting- Clothing Manipulation and Hygiene: Moderate assistance, Sit to/from stand, Cueing for compensatory techniques, Cueing for back precautions Toileting - Clothing Manipulation Details (indicate cue type and reason): requires assist for clothing management up and down due to impaired balacne Functional mobility during ADLs: Minimal assistance, Rolling walker(grossly min assist, several LOB with mod-max A to recover ) General ADL Comments: Patinet highly motivated, limited by pain in back, weakness, balance and fear of falling.  Noted several LOB during mobility and static standing requiring mod-max A to regain balance.   Cognition: Cognition Overall Cognitive Status: Within Functional Limits for tasks assessed Orientation Level: Oriented  X4 Cognition Arousal/Alertness: Awake/alert Behavior During Therapy: WFL for tasks assessed/performed Overall Cognitive Status: Within Functional Limits for tasks assessed General Comments: tearful regarding lack of relief from pain  Physical Exam: Blood pressure (!) 174/91, pulse 66, temperature 97.7 F (36.5 C), temperature source Oral, resp. rate 17, height '5\' 5"'$  (1.651 m), weight 66.7 kg, SpO2 100 %. Physical Exam  Constitutional: She appears well-developed.  HENT:  Head: Normocephalic and atraumatic.  Eyes: Pupils are equal, round, and reactive to light. EOM are normal.  Neck: Normal range of motion.  Cardiovascular: Normal rate and regular rhythm. Exam reveals no friction rub.  No murmur heard. Respiratory: Effort normal and breath sounds normal. No respiratory distress. She has no wheezes.  GI: Soft. She exhibits no distension.  Musculoskeletal:  Low back TTP. Limited flexion and extension while seated in bed. SLR + L>R.   Neurological: No cranial nerve deficit. Coordination normal.  UE 5/5. LE 1-3/4 and inconsistent with effort. Inconsistent sensory loss bilateral lower ext. DTR's absent in LE's.    Skin: No rash noted. No erythema.  Psychiatric: She has a normal mood and affect. Her behavior is normal. Judgment and thought content normal.    Results for orders placed or performed  during the hospital encounter of 07/12/18 (from the past 48 hour(s))  Creatinine, serum     Status: None   Collection Time: 07/19/18  5:21 AM  Result Value Ref Range   Creatinine, Ser 0.89 0.44 - 1.00 mg/dL   GFR calc non Af Amer >60 >60 mL/min   GFR calc Af Amer >60 >60 mL/min    Comment: (NOTE) The eGFR has been calculated using the CKD EPI equation. This calculation has not been validated in all clinical situations. eGFR's persistently <60 mL/min signify possible Chronic Kidney Disease. Performed at [facility], [place] [address] [place], [place] [address]    No results  found.     Medical Problem List and Plan: 1.  Decreased functional mobility secondary to acute on chronic lumbar radiculopathy and foraminal stenosis bilaterally with decompression May 2019.  Recent fall with exacerbation of pain and motor or sensory deficits.   -admit to inpatient rehab  - Interventional radiology to follow-up for planned ESI L4-5 9/24, ?left trans-foraminal 2.  DVT Prophylaxis/Anticoagulation: SCDs. 3. Pain Management: Neurontin 300 mg 3 times daily, Robaxin 500 mg 3 times daily, Ultram 50 mg every 12 hours, oxycodone as needed  -consider further titration  -mobilize as much as possible 4. Mood: Ativan 0.25-0.5 mg daily as needed 5. Neuropsych: This patient is capable of making decisions on her own behalf. 6. Skin/Wound Care: Routine skin checks 7. Fluids/Electrolytes/Nutrition: Routine in and outs with follow-up chemistries 8.  Hypertension.  Coreg 6.25 mg twice daily.  Monitor with increased mobility 9.  Hyperlipidemia.  Lipitor 20 mg daily. 10.  Constipation.  Laxative assistance    Post Admission Physician Evaluation: 1. Functional deficits secondary  to lumbar stenosis with radiculopathy. 2. Patient is admitted to receive collaborative, interdisciplinary care between the physiatrist, rehab nursing staff, and therapy team. 3. Patient's level of medical complexity and substantial therapy needs in context of that medical necessity cannot be provided at a lesser intensity of care such as a SNF. 4. Patient has experienced substantial functional loss from his/her baseline which was documented above under the "Functional History" and "Functional Status" headings.  Judging by the patient's diagnosis, physical exam, and functional history, the patient has potential for functional progress which will result in measurable gains while on inpatient rehab.  These gains will be of substantial and practical use upon discharge  in facilitating mobility and self-care at the household  level. 5. Physiatrist will provide 24 hour management of medical needs as well as oversight of the therapy plan/treatment and provide guidance as appropriate regarding the interaction of the two. 6. The Preadmission Screening has been reviewed and patient status is unchanged unless otherwise stated above. 7. 24 hour rehab nursing will assist with bladder management, bowel management, safety, skin/wound care, disease management, medication administration, pain management and patient education  and help integrate therapy concepts, techniques,education, etc. 8. PT will assess and treat for/with: Lower extremity strength, range of motion, stamina, balance, functional mobility, safety, adaptive techniques and equipment, NMR, pain control, orthotic.   Goals are: mod I. 9. OT will assess and treat for/with: ADL's, functional mobility, safety, upper extremity strength, adaptive techniques and equipment, NMR, PAIN CONTROL, orthotics.   Goals are: mod I. Therapy may proceed with showering this patient. 10. SLP will assess and treat for/with: n/a.  Goals are: n/a. 11. Case Management and Social Worker will assess and treat for psychological issues and discharge planning. 12. Team conference will be held weekly to assess progress toward goals and  to determine barriers to discharge. 13. Patient will receive at least 3 hours of therapy per day at least 5 days per week. 14. ELOS: 7 days       15. Prognosis:  excellent   I have personally performed a face to face diagnostic evaluation of this patient and formulated the key components of the plan.  Additionally, I have personally reviewed laboratory data, imaging studies, as well as relevant notes and concur with the physician assistant's documentation above.  Meredith Staggers, MD, FAAPMR    Lavon Paganini Artesian, PA-C 07/19/2018

## 2018-07-19 NOTE — Progress Notes (Signed)
Nurse tech reports at this time that patient had an episode of vertigo in the bathroom this AM.  Patient stated she felt dizzy and nurse tech caught her as she began to sink to the floor.  No fall occurred.  Nurse tech instructed to obtain orthostatic bp's when patient gets up again.  Will continue to monitor

## 2018-07-19 NOTE — Plan of Care (Signed)

## 2018-07-19 NOTE — Progress Notes (Signed)
Inpatient Rehabilitation  Please refer to consult by Dr. Naaman Plummer for full details.  He assessed patient for IP Rehab program on Friday, 9/20 and wanted to see how patient progressed over the weekend.  I had therapies re-assess this morning and patient with ongoing needs.  Met with patient at bedside to discuss team's recommendation for IP Rehab.  Patient known to our sevice from May 2019 after her surgery.  I shared booklets, reviewed insurance verification letter, and answered questions.  Patient shared that her case is being reviewed by worker's comp.  Therefore she requested that I not submit through her insurance and instead follow up with case manager about outcome of workers comp claim.  She provided me with case manager's name, Tyson Alias and her phone number [phone].  I called and left a message for Delane Ginger and she returned my call and requested that I provide her with updated clinical information via fax to [phone].  Called patient on her cell phone and confirmed that I had her approval to share information with Tyson Alias, she stated that I do and she appreciated my help.  Plan to submit clinical for review to [facility] with worker's comp and follow for their decision.  I have also updated patient accounting of the worker's comp. claim status.  Call if questions.    Carmelia Roller., CCC/SLP Admission Coordinator  [place]  Cell [phone]

## 2018-07-19 NOTE — Plan of Care (Signed)

## 2018-07-19 NOTE — Progress Notes (Addendum)
Subjective:   1 week s/p fall with increased back/leg pain due to spinal stenosis. Prior hx of lumbar decompression 02/2018 by [name] with residual RLE weakness (foot drop, in MAFO brace). She is tearful and anxious this morning, states pain meds are helping some but make her nauseous. She does not feel her pain has improved much since admission a week ago. Patient reports pain as moderate and severe.    Objective: Vital signs in last 24 hours: Temp:  [97.6 F (36.4 C)-98.1 F (36.7 C)] 97.7 F (36.5 C) (09/23 1540) Pulse Rate:  [66-92] 66 (09/23 0638) Resp:  [16-17] 17 (09/22 2014) BP: (123-174)/(74-91) 174/91 (09/23 0638) SpO2:  [98 %-100 %] 100 % (09/23 0867)  Intake/Output from previous day: 09/22 0701 - 09/23 0700 In: 1080 [P.O.:1080] Out: -  Intake/Output this shift: No intake/output data recorded.  No results for input(s): HGB in the last 72 hours. No results for input(s): WBC, RBC, HCT, PLT in the last 72 hours. Recent Labs    07/19/18 0521  CREATININE 0.89   No results for input(s): LABPT, INR in the last 72 hours.  Neurologically intact ABD soft Neurovascular intact Sensation intact distally LLE - R with decreased sensation plantar and dorsal foot Intact pulses distally Dorsiflexion/Plantar flexion intact L side- DF weak RLE (chronic) No cellulitis present Compartment soft no calf pain or sign of DVT  Assessment/Plan:   Low back/leg pain, spinal stenosis. 1 week s/p exacerbation due to fall  Plan ESI L4-5 due to ongoing pain Awaiting CIR consult today, PT recommending CIR due to RLE weakness. Pt unable to drive herself to outpt PT. She has significant pain, RLE weakness, numbness.  Discussed with [name]  Hold Lovenox pending injection to avoid epidural bleeding  Kalyn Hofstra M. 07/19/2018, 7:36 AM

## 2018-07-19 NOTE — Progress Notes (Signed)
Per patient she has questions about her epidural injection. Explained to pt that I saw the notes from the doctor stating she was supposed to have it done today and order is in the chart however, she was not on the OR schedule. Spoke with pt's MD and he stated pt was supposed to have the steroid injection done today and to call to follow up and why she did not have the procedure done today and to see if she could be scheduled tomorrow morning. This RN called OR and they stated they do not handle these cases and attempted to call radiology no answer. Attempted to call [facility] no answer. Will have RN on day shift f/u to have pt scheduled right away due to limited resources on night shift. Pt aware.

## 2018-07-19 NOTE — Progress Notes (Signed)
Spoke with [facility] and she stated that she will help figure out who we need to talk to, to have pt scheduled for her epidural injection tomorrow.

## 2018-07-19 NOTE — Progress Notes (Signed)
Occupational Therapy Treatment Patient Details Name: [name] MRN: [id number] DOB: 08/12/1961 Today's Date: 07/19/2018    History of present illness Pt is a 57 y.o. female presenting with back pain after a fall. PMH is significant for previous back surgery in May 2019, HLD, HTN, and anxiety.   OT comments  Pt continues to be very motivated to return to PLO[facility] and return to work, limited by pain but willing to work through. Today, Pt was able to perform transfers sit <>stand with mod A for boost - RLE buckling significantly. Unable to maintain standing safe enough to perform grooming - sp performed grooming in seated position this session. Pt also educated on toilet aide for rear peri care. CIR remains appropriate POC to maximize safety and independence in transfers and ADL.    Follow Up Recommendations  CIR;Supervision/Assistance - 24 hour    Equipment Recommendations  Other (comment)(toilet aide)    Recommendations for Other Services      Precautions / Restrictions Precautions Precautions: Back Precaution Comments: reviewed 3/3 back precautions Required Braces or Orthoses: Spinal Brace Spinal Brace: Other (comment) Spinal Brace Comments: other lumbar brace from home Restrictions Weight Bearing Restrictions: No       Mobility Bed Mobility               General bed mobility comments: Pt OOB in recliner at beginning and end of session  Transfers Overall transfer level: Needs assistance Equipment used: Rolling walker (2 wheeled) Transfers: Sit to/from Stand Sit to Stand: Mod assist         General transfer comment: mod A for power up, RLE required blocking out this session    Balance Overall balance assessment: Needs assistance Sitting-balance support: No upper extremity supported;Feet supported Sitting balance-Leahy Scale: Good     Standing balance support: Bilateral upper extremity supported Standing balance-Leahy Scale: Poor Standing balance  comment: heavy reliance on BUE                           ADL either performed or assessed with clinical judgement   ADL Overall ADL's : Needs assistance/impaired     Grooming: Oral care;Wash/dry face;Wash/dry hands;Set up;Sitting Grooming Details (indicate cue type and reason): unable to maintain standing for grooming tasks this session                     Toileting- Clothing Manipulation and Hygiene: Moderate assistance;Sit to/from stand;Cueing for compensatory techniques;Cueing for back precautions Toileting - Clothing Manipulation Details (indicate cue type and reason): Pt educated in use of toilet aide to maximize independence       General ADL Comments: Pt continues to be highly motivated to return to independent. performed sit <>stand multiple times with mod A but currently the RLE requires blocking out to maintain standing - did not attempt further mobility this session     Vision   Vision Assessment?: No apparent visual deficits   Perception     Praxis      Cognition Arousal/Alertness: Awake/alert Behavior During Therapy: WFL for tasks assessed/performed Overall Cognitive Status: Within Functional Limits for tasks assessed                                 General Comments: tearful regarding lack of relief from pain        Exercises Total Joint Exercises Ankle Circles/Pumps: PROM;AAROM;10 reps;Right;Left Short Arc Quad: AAROM;Right;Left;10  reps Heel Slides: AAROM;Right;Left;10 reps Long Arc Quad: AAROM;Right;Left;10 reps   Shoulder Instructions       General Comments Pt reports that she feels like she is fuzzy headed due to medications    Pertinent Vitals/ Pain       Pain Assessment: 0-10 Pain Score: 9  Pain Location: low back radiating down legs Pain Descriptors / Indicators: Radiating;Guarding;Grimacing Pain Intervention(s): Monitored during session;Repositioned  Home Living                                           Prior Functioning/Environment              Frequency  Min 2X/week        Progress Toward Goals  OT Goals(current goals can now be found in the care plan section)  Progress towards OT goals: Progressing toward goals  Acute Rehab OT Goals Patient Stated Goal: to get stronger  OT Goal Formulation: With patient Time For Goal Achievement: 07/30/18 Potential to Achieve Goals: Good  Plan Discharge plan remains appropriate;Frequency remains appropriate    Co-evaluation                 AM-PAC PT "6 Clicks" Daily Activity     Outcome Measure   Help from another person eating meals?: None Help from another person taking care of personal grooming?: A Little Help from another person toileting, which includes using toliet, bedpan, or urinal?: A Lot Help from another person bathing (including washing, rinsing, drying)?: A Lot Help from another person to put on and taking off regular upper body clothing?: None Help from another person to put on and taking off regular lower body clothing?: A Lot 6 Click Score: 17    End of Session Equipment Utilized During Treatment: Gait belt;Rolling walker;Back brace  OT Visit Diagnosis: Unsteadiness on feet (R26.81);Other abnormalities of gait and mobility (R26.89);Muscle weakness (generalized) (M62.81);History of falling (Z91.81);Pain Pain - part of body: (back)   Activity Tolerance Patient tolerated treatment well   Patient Left in chair;with call bell/phone within reach   Nurse Communication Mobility status        Time: 6433-2951 OT Time Calculation (min): 39 min  Charges: OT General Charges $OT Visit: 1 Visit OT Treatments $Self Care/Home Management : 23-37 mins $Therapeutic Activity: 8-22 mins  Hulda Humphrey OTR/L Acute Rehabilitation Services Pager: [phone] Office: [place] 07/19/2018, 10:40 AM

## 2018-07-19 NOTE — Plan of Care (Signed)
  Problem: Education: Goal: Knowledge of General Education information will improve Description Including pain rating scale, medication(s)/side effects and non-pharmacologic comfort measures Outcome: Progressing   Problem: Health Behavior/Discharge Planning: Goal: Ability to manage health-related needs will improve Outcome: Progressing   Problem: Clinical Measurements: Goal: Ability to maintain clinical measurements within normal limits will improve Outcome: Progressing Goal: Will remain free from infection Outcome: Progressing Goal: Diagnostic test results will improve Outcome: Progressing   Problem: Activity: Goal: Risk for activity intolerance will decrease Outcome: Progressing   Problem: Coping: Goal: Level of anxiety will decrease Outcome: Progressing   Problem: Pain Managment: Goal: General experience of comfort will improve Outcome: Progressing   Problem: Safety: Goal: Ability to remain free from injury will improve Outcome: Progressing

## 2018-07-19 NOTE — Progress Notes (Signed)
PROGRESS NOTE  [name] ZOX:[id number] DOB: October 25, 1961 DOA: 07/12/2018 PCP: [name], [name], MD  HPI/Recap of past 24 hours:  She reports  low back pain , pain still radiating down to her buttock and left leg She continue c/o left leg being " so weak" her left leg give out while trying to get up with physical therapy today  She has chronic right leg weakness and  foot numbness after surgery in 02/2018, she drag her right leg at baseline if she does not put on brace.  She is looking forward to get therapy at [facility] to get stronger  Assessment/Plan: Principal Problem:   Back pain Active Problems:   Benign essential HTN   Hyperlipidemia   Generalized anxiety disorder with panic attacks   Difficulty in urination  Low Back pain after incidental fall: -h/o back surgery (redo L4-5 laminectomies and discectomy ) in 02/2018 -she presented with back pain 10/10 after a fall (she was trying to sit on a rolling wheelchair, when it slit out from underneath her and she fell on her back ) -mri of spine reviewed by ortho Dr Tonita Cong, per Dr Tonita Cong there is "A annular tear at L4-5 with a small central disc protrusion and neuroforaminal stenosis." , he recommended conservative management with steroids/analgesics/ muscle relaxer/neurontin/PT -she was in iv steroids, iv morphine initially -she is currently on oral prednisone taper/scheduled robaxin tid /neurotin bid/ ultram bid and prn percocet -reports pain is better, left leg still very weak, right leg chronically weak and numbe -CIR reeval on monday -case discussed with ortho, ortho plan for epidural steroid injection at L4-5, lovenox held   Leukocytosis Likely due to steroids, no sign of infection   Urinary difficulty -ua unremarkable -resolved  Constipation: miralax, senokot, enema resolved  HTN/HLD Stable on home meds  CKDII Cr stable at baseline  Anemia on chronic disease hgb stable at baseline around  11.  Anxiety Continue Valium  Code Status: full  Family Communication: patient   Disposition Plan: CIR placement , pending insurance approval   Consultants:  orthopedics Dr Tonita Cong  C[facility]  Procedures:  none  Antibiotics:  none   Objective: BP (!) 157/97 (BP Location: Right Arm)   Pulse 94   Temp 97.7 F (36.5 C) (Oral)   Resp 19   Ht 5\' 5"  (1.651 m)   Wt 66.7 kg   SpO2 99%   BMI 24.46 kg/m   Intake/Output Summary (Last 24 hours) at 07/19/2018 1558 Last data filed at 07/19/2018 1300 Gross per 24 hour  Intake 960 ml  Output -  Net 960 ml   Filed Weights   07/12/18 1251 07/12/18 1727  Weight: 66.7 kg 66.7 kg    Exam: Patient is examined daily including today on 07/19/2018, exams remain the same as of yesterday except that has changed    General:  NAD  Cardiovascular: RRR  Respiratory: CTABL  Abdomen: Soft/ND/NT, positive BS  Musculoskeletal: low back tender to palpation, No Edema, left leg able to lift against gravity at 30degree, right leg not able to lift against gravity   Neuro: alert, oriented   Data Reviewed: Basic Metabolic Panel: Recent Labs  Lab 07/12/18 1709 07/16/18 0420 07/19/18 0521  NA 141 138  --   K 3.4* 4.5  --   CL 108 100  --   CO2 28 30  --   GLUCOSE 75 132*  --   BUN 9 10  --   CREATININE 0.83 0.87 0.89  CALCIUM 8.0* 9.0  --  MG  --  2.0  --    Liver Function Tests: No results for input(s): AST, ALT, ALKPHOS, BILITOT, PROT, ALBUMIN in the last 168 hours. No results for input(s): LIPASE, AMYLASE in the last 168 hours. No results for input(s): AMMONIA in the last 168 hours. CBC: Recent Labs  Lab 07/12/18 1709 07/16/18 0420  WBC 4.9 17.4*  NEUTROABS 2.2  --   HGB 11.3* 12.9  HCT 35.7* 39.9  MCV 91.3 90.1  PLT 238 262   Cardiac Enzymes:   Recent Labs  Lab 07/17/18 0750  TROPONINI <0.03   BNP (last 3 results) No results for input(s): BNP in the last 8760 hours.  ProBNP (last 3 results) No results for  input(s): PROBNP in the last 8760 hours.  CBG: No results for input(s): GLUCAP in the last 168 hours.  No results found for this or any previous visit (from the past 240 hour(s)).   Studies: No results found.  Scheduled Meds: . atorvastatin  20 mg Oral Daily  . carvedilol  6.25 mg Oral BID  . estrogens (conjugated)  0.625 mg Oral Daily  . gabapentin  300 mg Oral TID  . methocarbamol  500 mg Oral TID  . polyethylene glycol  17 g Oral BID  . senna-docusate  1 tablet Oral BID  . traMADol  50 mg Oral Q12H    Continuous Infusions: . sodium chloride Stopped (07/15/18 0245)     Time spent: 34mins,  I have personally reviewed and interpreted on  07/19/2018 daily labs,  imagings as discussed above under date review session and assessment and plans.  I reviewed all nursing notes, pharmacy notes, consultant notes,  vitals, pertinent old records  I have discussed plan of care as described above with RN , patient  on 07/19/2018   Florencia Reasons MD, PhD  [facility] Pager [phone]. If 7PM-7AM, please contact night-coverage at [email or web address], password [facility] 07/19/2018, 3:58 PM  LOS: 5 days

## 2018-07-19 NOTE — Progress Notes (Signed)
Physical Therapy Treatment Patient Details Name: [name] MRN: [id number] DOB: 07/13/1961 Today's Date: 07/19/2018    History of Present Illness Pt is a 57 y.o. female presenting with back pain after a fall. PMH is significant for previous back surgery in May 2019, HLD, HTN, and anxiety.    PT Comments    Pt received in recliner. Reports she has only been OOB for a few minutes. Session limited to LE exercises due to pain. She is scheduled to receive epidural steroid injection at L4-5 today.    Follow Up Recommendations  CIR;Supervision/Assistance - 24 hour     Equipment Recommendations  None recommended by PT    Recommendations for Other Services       Precautions / Restrictions Precautions Precautions: Back Precaution Comments: reviewed 3/3 back precautions Required Braces or Orthoses: Spinal Brace Spinal Brace: Other (comment) Spinal Brace Comments: other lumbar brace from home    Mobility  Bed Mobility               General bed mobility comments: Pt received in recliner.  Transfers                    Ambulation/Gait                 Stairs             Wheelchair Mobility    Modified Rankin (Stroke Patients Only)       Balance                                            Cognition Arousal/Alertness: Awake/alert Behavior During Therapy: WFL for tasks assessed/performed Overall Cognitive Status: Within Functional Limits for tasks assessed                                 General Comments: tearful regarding lack of relief from pain      Exercises Total Joint Exercises Ankle Circles/Pumps: PROM;AAROM;10 reps;Right;Left Short Arc Quad: AAROM;Right;Left;10 reps Heel Slides: AAROM;Right;Left;10 reps Long Arc Quad: AAROM;Right;Left;10 reps    General Comments        Pertinent Vitals/Pain Pain Assessment: 0-10 Pain Score: 9  Pain Location: low back radiating down legs Pain  Descriptors / Indicators: Radiating;Guarding;Grimacing Pain Intervention(s): Limited activity within patient's tolerance;Monitored during session;Premedicated before session    Home Living                      Prior Function            PT Goals (current goals can now be found in the care plan section) Acute Rehab PT Goals Patient Stated Goal: to get stronger  PT Goal Formulation: With patient Time For Goal Achievement: 07/30/18 Potential to Achieve Goals: Good Progress towards PT goals: Not progressing toward goals - comment(pain)    Frequency    Min 3X/week      PT Plan Current plan remains appropriate    Co-evaluation              AM-PAC PT "6 Clicks" Daily Activity  Outcome Measure  Difficulty turning over in bed (including adjusting bedclothes, sheets and blankets)?: A Little Difficulty moving from lying on back to sitting on the side of the bed? : A Lot Difficulty sitting down on and standing up  from a chair with arms (e.g., wheelchair, bedside commode, etc,.)?: Unable Help needed moving to and from a bed to chair (including a wheelchair)?: A Little Help needed walking in hospital room?: A Little Help needed climbing 3-5 steps with a railing? : A Lot 6 Click Score: 14    End of Session   Activity Tolerance: Patient limited by pain Patient left: in chair;with call bell/phone within reach Nurse Communication: Mobility status PT Visit Diagnosis: Other abnormalities of gait and mobility (R26.89);Difficulty in walking, not elsewhere classified (R26.2);Muscle weakness (generalized) (M62.81);History of falling (Z91.81);Pain;Dizziness and giddiness (R42)     Time: 6553-7482 PT Time Calculation (min) (ACUTE ONLY): 18 min  Charges:  $Therapeutic Exercise: 8-22 mins                     Lorrin Goodell, PT  Office # [phone] Pager [phone]    Lorriane Shire 07/19/2018, 9:16 AM

## 2018-07-20 ENCOUNTER — Encounter (HOSPITAL_COMMUNITY): Payer: Self-pay

## 2018-07-20 ENCOUNTER — Other Ambulatory Visit: Payer: Self-pay

## 2018-07-20 ENCOUNTER — Encounter: Payer: 59 | Admitting: Physical Therapy

## 2018-07-20 ENCOUNTER — Inpatient Hospital Stay (HOSPITAL_COMMUNITY)
Admission: RE | Admit: 2018-07-20 | Discharge: 2018-08-03 | DRG: 560 | Disposition: A | Discharge status: Home / Self Care | Payer: 59 | Source: Intra-hospital | Attending: Physical Medicine & Rehabilitation | Admitting: Physical Medicine & Rehabilitation

## 2018-07-20 DIAGNOSIS — D62 Acute posthemorrhagic anemia: Secondary | ICD-10-CM | POA: Diagnosis present

## 2018-07-20 DIAGNOSIS — Z888 Allergy status to other drugs, medicaments and biological substances status: Secondary | ICD-10-CM | POA: Diagnosis not present

## 2018-07-20 DIAGNOSIS — M5416 Radiculopathy, lumbar region: Secondary | ICD-10-CM | POA: Diagnosis present

## 2018-07-20 DIAGNOSIS — M25562 Pain in left knee: Secondary | ICD-10-CM | POA: Diagnosis not present

## 2018-07-20 DIAGNOSIS — K5900 Constipation, unspecified: Secondary | ICD-10-CM | POA: Diagnosis present

## 2018-07-20 DIAGNOSIS — I10 Essential (primary) hypertension: Secondary | ICD-10-CM | POA: Diagnosis present

## 2018-07-20 DIAGNOSIS — M47817 Spondylosis without myelopathy or radiculopathy, lumbosacral region: Secondary | ICD-10-CM | POA: Diagnosis not present

## 2018-07-20 DIAGNOSIS — Z79891 Long term (current) use of opiate analgesic: Secondary | ICD-10-CM | POA: Diagnosis not present

## 2018-07-20 DIAGNOSIS — Z79899 Other long term (current) drug therapy: Secondary | ICD-10-CM

## 2018-07-20 DIAGNOSIS — M48061 Spinal stenosis, lumbar region without neurogenic claudication: Secondary | ICD-10-CM

## 2018-07-20 DIAGNOSIS — Z4789 Encounter for other orthopedic aftercare: Secondary | ICD-10-CM | POA: Diagnosis not present

## 2018-07-20 DIAGNOSIS — Z90710 Acquired absence of both cervix and uterus: Secondary | ICD-10-CM | POA: Diagnosis not present

## 2018-07-20 DIAGNOSIS — Z884 Allergy status to anesthetic agent status: Secondary | ICD-10-CM | POA: Diagnosis not present

## 2018-07-20 DIAGNOSIS — E785 Hyperlipidemia, unspecified: Secondary | ICD-10-CM | POA: Diagnosis not present

## 2018-07-20 DIAGNOSIS — Z881 Allergy status to other antibiotic agents status: Secondary | ICD-10-CM | POA: Diagnosis not present

## 2018-07-20 DIAGNOSIS — W1830XD Fall on same level, unspecified, subsequent encounter: Secondary | ICD-10-CM

## 2018-07-20 DIAGNOSIS — G8929 Other chronic pain: Secondary | ICD-10-CM | POA: Diagnosis not present

## 2018-07-20 DIAGNOSIS — Z91018 Allergy to other foods: Secondary | ICD-10-CM | POA: Diagnosis not present

## 2018-07-20 DIAGNOSIS — Z8249 Family history of ischemic heart disease and other diseases of the circulatory system: Secondary | ICD-10-CM | POA: Diagnosis not present

## 2018-07-20 DIAGNOSIS — F419 Anxiety disorder, unspecified: Secondary | ICD-10-CM | POA: Diagnosis not present

## 2018-07-20 DIAGNOSIS — Z880 Allergy status to penicillin: Secondary | ICD-10-CM | POA: Diagnosis not present

## 2018-07-20 DIAGNOSIS — R269 Unspecified abnormalities of gait and mobility: Secondary | ICD-10-CM

## 2018-07-20 DIAGNOSIS — M5410 Radiculopathy, site unspecified: Secondary | ICD-10-CM

## 2018-07-20 DIAGNOSIS — M5440 Lumbago with sciatica, unspecified side: Secondary | ICD-10-CM

## 2018-07-20 DIAGNOSIS — M21371 Foot drop, right foot: Secondary | ICD-10-CM | POA: Diagnosis present

## 2018-07-20 DIAGNOSIS — Z91040 Latex allergy status: Secondary | ICD-10-CM | POA: Diagnosis not present

## 2018-07-20 DIAGNOSIS — F411 Generalized anxiety disorder: Secondary | ICD-10-CM

## 2018-07-20 MED ORDER — ONDANSETRON HCL 4 MG/2ML IJ SOLN
4.0000 mg | Freq: Four times a day (QID) | INTRAMUSCULAR | Status: DC | PRN
  Administered 2018-07-22 – 2018-07-26 (×3): 4 mg via INTRAVENOUS
  Filled 2018-07-20 (×4): qty 2

## 2018-07-20 MED ORDER — METHOCARBAMOL 500 MG PO TABS
500.0000 mg | ORAL_TABLET | Freq: Three times a day (TID) | ORAL | Status: DC
  Administered 2018-07-20 – 2018-08-03 (×41): 500 mg via ORAL
  Filled 2018-07-20 (×41): qty 1

## 2018-07-20 MED ORDER — GABAPENTIN 300 MG PO CAPS
300.0000 mg | ORAL_CAPSULE | Freq: Three times a day (TID) | ORAL | Status: DC
  Administered 2018-07-20 – 2018-07-23 (×8): 300 mg via ORAL
  Filled 2018-07-20 (×8): qty 1

## 2018-07-20 MED ORDER — CARVEDILOL 6.25 MG PO TABS
6.2500 mg | ORAL_TABLET | Freq: Two times a day (BID) | ORAL | Status: DC
  Administered 2018-07-20 – 2018-08-03 (×28): 6.25 mg via ORAL
  Filled 2018-07-20 (×28): qty 1

## 2018-07-20 MED ORDER — POLYETHYLENE GLYCOL 3350 17 G PO PACK
17.0000 g | PACK | Freq: Two times a day (BID) | ORAL | Status: DC
  Administered 2018-07-20 – 2018-08-03 (×23): 17 g via ORAL
  Filled 2018-07-20 (×27): qty 1

## 2018-07-20 MED ORDER — ESTROGENS CONJUGATED 0.625 MG PO TABS
0.6250 mg | ORAL_TABLET | Freq: Every day | ORAL | Status: DC
  Administered 2018-07-21 – 2018-08-03 (×14): 0.625 mg via ORAL
  Filled 2018-07-20 (×14): qty 1

## 2018-07-20 MED ORDER — OXYCODONE-ACETAMINOPHEN 5-325 MG PO TABS
1.0000 | ORAL_TABLET | ORAL | Status: DC | PRN
  Administered 2018-07-20 – 2018-07-23 (×15): 2 via ORAL
  Administered 2018-07-24 (×2): 1 via ORAL
  Administered 2018-07-24 – 2018-07-25 (×2): 2 via ORAL
  Administered 2018-07-25 (×3): 1 via ORAL
  Administered 2018-07-26 (×2): 2 via ORAL
  Administered 2018-07-26: 1 via ORAL
  Administered 2018-07-27 – 2018-07-31 (×11): 2 via ORAL
  Administered 2018-07-31: 1 via ORAL
  Administered 2018-08-01: 2 via ORAL
  Administered 2018-08-01: 1 via ORAL
  Administered 2018-08-02: 2 via ORAL
  Administered 2018-08-02: 1 via ORAL
  Administered 2018-08-03: 2 via ORAL
  Filled 2018-07-20: qty 2
  Filled 2018-07-20: qty 1
  Filled 2018-07-20 (×6): qty 2
  Filled 2018-07-20 (×2): qty 1
  Filled 2018-07-20: qty 2
  Filled 2018-07-20 (×2): qty 1
  Filled 2018-07-20 (×2): qty 2
  Filled 2018-07-20: qty 1
  Filled 2018-07-20 (×14): qty 2
  Filled 2018-07-20: qty 1
  Filled 2018-07-20 (×2): qty 2
  Filled 2018-07-20 (×2): qty 1
  Filled 2018-07-20 (×2): qty 2
  Filled 2018-07-20: qty 1
  Filled 2018-07-20 (×6): qty 2

## 2018-07-20 MED ORDER — LORAZEPAM 0.5 MG PO TABS
0.2500 mg | ORAL_TABLET | Freq: Every day | ORAL | Status: DC | PRN
  Administered 2018-07-21 – 2018-07-30 (×4): 0.5 mg via ORAL
  Filled 2018-07-20 (×4): qty 1

## 2018-07-20 MED ORDER — ATORVASTATIN CALCIUM 20 MG PO TABS
20.0000 mg | ORAL_TABLET | Freq: Every day | ORAL | Status: DC
  Administered 2018-07-21 – 2018-08-03 (×14): 20 mg via ORAL
  Filled 2018-07-20 (×14): qty 1

## 2018-07-20 MED ORDER — ONDANSETRON HCL 4 MG PO TABS
4.0000 mg | ORAL_TABLET | Freq: Four times a day (QID) | ORAL | Status: DC | PRN
  Administered 2018-07-21: 4 mg via ORAL
  Filled 2018-07-20: qty 1

## 2018-07-20 MED ORDER — TRAMADOL HCL 50 MG PO TABS
50.0000 mg | ORAL_TABLET | Freq: Two times a day (BID) | ORAL | Status: DC
  Administered 2018-07-20 – 2018-08-03 (×27): 50 mg via ORAL
  Filled 2018-07-20 (×28): qty 1

## 2018-07-20 MED ORDER — SENNOSIDES-DOCUSATE SODIUM 8.6-50 MG PO TABS
1.0000 | ORAL_TABLET | Freq: Two times a day (BID) | ORAL | Status: DC
  Administered 2018-07-20 – 2018-08-03 (×28): 1 via ORAL
  Filled 2018-07-20 (×29): qty 1

## 2018-07-20 MED ORDER — ACETAMINOPHEN 325 MG PO TABS
325.0000 mg | ORAL_TABLET | ORAL | Status: DC | PRN
  Administered 2018-07-21 – 2018-07-27 (×3): 650 mg via ORAL
  Filled 2018-07-20 (×6): qty 2

## 2018-07-20 NOTE — Discharge Summary (Signed)
Discharge Summary  [name]:[id number] DOB: 1961/07/20  PCP: [name], [name], MD  Admit date: 07/12/2018 Discharge date: 07/20/2018  Time spent: 55mins, more than 50% time spent on coordination of care Patient is discharged to inpatient rehab   Discharge Diagnoses:  Active Hospital Problems   Diagnosis Date Noted  . Back pain 07/12/2018  . Difficulty in urination 07/13/2018  . Hyperlipidemia 04/16/2018  . Generalized anxiety disorder with panic attacks 04/16/2018  . Benign essential HTN     Resolved Hospital Problems  No resolved problems to display.    Discharge Condition: stable  Diet recommendation: heart healthy  Filed Weights   07/12/18 1251 07/12/18 1727  Weight: 66.7 kg 66.7 kg    History of present illness: (per admitting MD Dr Karleen Hampshire)  PCP: [name], [name], MD  Patient coming from: Home  I have personally briefly reviewed patient's old medical records in [place]  Chief Complaint: fall with back pain   HPI: [name] is a 57 y.o. female with medical history significant of back surgery in may 2019, hypertensin,  Hyperlipidemia, anxiety, was at work today and was trying to sit on a rolling wheelchair, when it slit out from underneath her and she fell on her back . She did not lose her consciousness, ws not dizzy prior to the fall. Pt reports she couldn't get up from the fall and has severe back pain radiating to the legs, more on the left side than on the right side. She denies any fevers or chills, headache, sob, chest pain, cough, palpitations, dizziness, syncope, or sweating. She reports some nausea from severe pain, but no vomiting or abdominal pain. No diarrhea or constipation or dysuria. She reports some tingling in the left leg. Any movement of the back  And legs are making her back pain worse.  ED Course: she was seen in ED and underwent CT of the lumbar and thoracic spine, does not show any fractures. It shows  Postsurgical changes of bilateral L4 laminectomies. No acute abnormality, spinal canal stenosis or nerve root impingement. Labs are pending at this time.  Hospital Course:  Principal Problem:   Back pain Active Problems:   Benign essential HTN   Hyperlipidemia   Generalized anxiety disorder with panic attacks   Difficulty in urination   Low Back pain after incidental fall: -h/o back surgery (redo L4-5 laminectomies and discectomy ) in 02/2018 -she presented with back pain 10/10 after a fall (she was trying to sit on a rolling wheelchair, when it slit out from underneath her and she fell on her back ) -mri of spine reviewed by ortho Dr Tonita Cong, per Dr Tonita Cong there is "A annular tear at L4-5 with a small central disc protrusion and neuroforaminal stenosis." , he recommended conservative management with steroids/analgesics/ muscle relaxer/neurontin/PT -she was in iv steroids, iv morphine initially,  -she finished oral prednisone taper, she is currently on scheduled robaxin tid /neurotin bid/ ultram bid and prn percocet -reports pain is better, left leg still very weak, right leg chronically weak and numbe -ortho plan for epidural steroid injection at L4-5, lovenox held -defer to ortho -d/c to CIR   Leukocytosis Likely due to steroids, no sign of infection   Urinary difficulty -ua unremarkable -resolved  Constipation: miralax, senokot, enema resolved  HTN/HLD Stable on home meds  CKDII Cr stable at baseline  Anemia on chronic disease hgb stable at baseline around 11.  Anxiety Continue Valium  Code Status: full  Family Communication:  patient   Disposition Plan: CIR placement    Consultants:  orthopedics Dr Tonita Cong  CIR  Procedures:  none  Antibiotics:  none   Discharge Exam: BP (!) 149/78 (BP Location: Right Arm)   Pulse 75   Temp 97.7 F (36.5 C) (Oral)   Resp 16   Ht 5\' 5"  (1.651 m)   Wt 66.7 kg   SpO2 100%   BMI 24.46 kg/m     General:  NAD  Cardiovascular: RRR  Respiratory: CTABL  Abdomen: Soft/ND/NT, positive BS  Musculoskeletal: low back tender to palpation, No Edema, left leg able to lift against gravity at 30degree, right leg not able to lift against gravity   Neuro: alert, oriented    Discharge Instructions You were cared for by a hospitalist during your hospital stay. If you have any questions about your discharge medications or the care you received while you were in the hospital after you are discharged, you can call the unit and asked to speak with the hospitalist on call if the hospitalist that took care of you is not available. Once you are discharged, your primary care physician will handle any further medical issues. Please note that NO REFILLS for any discharge medications will be authorized once you are discharged, as it is imperative that you return to your primary care physician (or establish a relationship with a primary care physician if you do not have one) for your aftercare needs so that they can reassess your need for medications and monitor your lab values.  Discharge Instructions    Diet - low sodium heart healthy   Complete by:  As directed    Increase activity slowly   Complete by:  As directed      Allergies as of 07/20/2018      Reactions   Cephalosporins Anaphylaxis   Penicillins Anaphylaxis, Hives   Has patient had a PCN reaction causing immediate rash, facial/tongue/throat swelling, SOB or lightheadedness with hypotension: Yes Has patient had a PCN reaction causing severe rash involving mucus membranes or skin necrosis: Yes Has patient had a PCN reaction that required hospitalization Yes Has patient had a PCN reaction occurring within the last 10 years: No If all of the above answers are "NO", then may proceed with Cephalosporin use.   Anesthetics, Amide Nausea And Vomiting   Does not know name of it. States they put it on record foot center.    Peach [prunus Persica]  Hives   Latex Hives, Itching, Rash      Medication List    TAKE these medications   acetaminophen 325 MG tablet Commonly known as:  TYLENOL Take 1-2 tablets (325-650 mg total) by mouth every 4 (four) hours as needed for mild pain.   acidophilus Caps capsule Take 1 capsule by mouth daily.   ALPRAZolam 0.25 MG tablet Commonly known as:  XANAX Take 1 tablet (0.25 mg total) by mouth daily as needed for anxiety or sleep.   atorvastatin 20 MG tablet Commonly known as:  LIPITOR Take 1 tablet (20 mg total) by mouth daily.   carvedilol 6.25 MG tablet Commonly known as:  COREG TAKE 1 TABLET BY MOUTH 2 TIMES DAILY. What changed:  when to take this   docusate sodium 100 MG capsule Commonly known as:  COLACE Take 1 capsule (100 mg total) by mouth 2 (two) times daily. What changed:    when to take this  reasons to take this   EPINEPHrine 0.3 mg/0.3 mL Soaj injection Commonly known as:  EPI-PEN Inject 0.3 mg into the muscle once. For severe allergic reaction   gabapentin 300 MG capsule Commonly known as:  NEURONTIN Take 1 capsule (300 mg total) by mouth 3 (three) times daily.   LORazepam 0.5 MG tablet Commonly known as:  ATIVAN Take 0.5-1 tablets (0.25-0.5 mg total) by mouth daily as needed for anxiety.   methocarbamol 500 MG tablet Commonly known as:  ROBAXIN Take 1 tablet (500 mg total) by mouth every 6 (six) hours as needed for muscle spasms.   polyethylene glycol packet Commonly known as:  MIRALAX / GLYCOLAX Take 17 g by mouth daily.   PREMARIN 0.625 MG tablet Generic drug:  estrogens (conjugated) Take 0.625 mg by mouth daily.   traMADol 50 MG tablet Commonly known as:  ULTRAM Take 1 tablet (50 mg total) by mouth every 6 (six) hours as needed for moderate pain.      Allergies  Allergen Reactions  . Cephalosporins Anaphylaxis  . Penicillins Anaphylaxis and Hives    Has patient had a PCN reaction causing immediate rash, facial/tongue/throat swelling, SOB or  lightheadedness with hypotension: Yes Has patient had a PCN reaction causing severe rash involving mucus membranes or skin necrosis: Yes Has patient had a PCN reaction that required hospitalization Yes Has patient had a PCN reaction occurring within the last 10 years: No If all of the above answers are "NO", then may proceed with Cephalosporin use.   . Anesthetics, Amide Nausea And Vomiting    Does not know name of it. States they put it on record foot center.   [name] Peach [Prunus Persica] Hives  . Latex Hives, Itching and Rash   Follow-up Information    [name], [name], MD Follow up.   Specialty:  Family Medicine Contact information: [name] [address] [phone]        [name], MD Follow up.   Specialty:  Orthopedic Surgery Contact information: [address] [address] [place] [address] [phone]            The results of significant diagnostics from this hospitalization (including imaging, microbiology, ancillary and laboratory) are listed below for reference.    Significant Diagnostic Studies: Dg Lumbar Spine Complete  Result Date: 07/08/2018 CLINICAL DATA:  Hx laminectomy 03/04/2018, hx of a fall 3 days ago, low back pain EXAM: LUMBAR SPINE - COMPLETE 4+ VIEW COMPARISON:  03/04/2018 FINDINGS: There has been subtle change in the appearance of the superior endplate of L3, raising the question of superior endplate fracture superimposed on degenerative disc disease. No retropulsion. There is normal alignment of the lumbar spine. Disc height loss identified at L2-3 and associated with uncovertebral spurring. There is mild facet hypertrophy in the LOWER levels. Laminectomy at L4-5. No suspicious lytic or blastic lesions are identified. IMPRESSION: Question of superior endplate fracture of L3.  Normal alignment. Electronically Signed   By: Nolon Nations M.D.   On: 07/08/2018 09:22   Ct Thoracic Spine Wo Contrast  Result Date:  07/12/2018 CLINICAL DATA:  Fall.  Back pain.  History of lumbar surgery. EXAM: CT THORACIC AND LUMBAR SPINE WITHOUT CONTRAST TECHNIQUE: Multidetector CT imaging of the thoracic and lumbar spine was performed without contrast. Multiplanar CT image reconstructions were also generated. COMPARISON:  None. FINDINGS: CT THORACIC SPINE FINDINGS Alignment: Normal. Vertebrae: No acute fracture or focal pathologic process. Paraspinal and other soft tissues: Negative. Disc levels: No visible disc herniation or spinal canal stenosis. CT LUMBAR SPINE FINDINGS Segmentation: Standard Alignment: Root normal Vertebrae:  No acute fracture or focal pathologic process. Paraspinal and other soft tissues: Mild calcific aortic atherosclerosis. Disc levels: There is no traumatic disc herniation, spinal canal stenosis or nerve root impingement. The patient has undergone bilateral decompressive laminectomies at the L4-5 level. There is moderate left facet hypertrophy at L3-4. IMPRESSION: CT THORACIC SPINE IMPRESSION No acute abnormality or evidence of spinal canal or neural foraminal stenosis. CT LUMBAR SPINE IMPRESSION Postsurgical changes of bilateral L4 laminectomies. No acute abnormality, spinal canal stenosis or nerve root impingement. Electronically Signed   By: Ulyses Jarred M.D.   On: 07/12/2018 14:44   Ct Lumbar Spine Wo Contrast  Result Date: 07/12/2018 CLINICAL DATA:  Fall.  Back pain.  History of lumbar surgery. EXAM: CT THORACIC AND LUMBAR SPINE WITHOUT CONTRAST TECHNIQUE: Multidetector CT imaging of the thoracic and lumbar spine was performed without contrast. Multiplanar CT image reconstructions were also generated. COMPARISON:  None. FINDINGS: CT THORACIC SPINE FINDINGS Alignment: Normal. Vertebrae: No acute fracture or focal pathologic process. Paraspinal and other soft tissues: Negative. Disc levels: No visible disc herniation or spinal canal stenosis. CT LUMBAR SPINE FINDINGS Segmentation: Standard Alignment: Root  normal Vertebrae: No acute fracture or focal pathologic process. Paraspinal and other soft tissues: Mild calcific aortic atherosclerosis. Disc levels: There is no traumatic disc herniation, spinal canal stenosis or nerve root impingement. The patient has undergone bilateral decompressive laminectomies at the L4-5 level. There is moderate left facet hypertrophy at L3-4. IMPRESSION: CT THORACIC SPINE IMPRESSION No acute abnormality or evidence of spinal canal or neural foraminal stenosis. CT LUMBAR SPINE IMPRESSION Postsurgical changes of bilateral L4 laminectomies. No acute abnormality, spinal canal stenosis or nerve root impingement. Electronically Signed   By: Ulyses Jarred M.D.   On: 07/12/2018 14:44   Mr Lumbar Spine W Wo Contrast  Result Date: 07/13/2018 CLINICAL DATA:  Low back pain, fall at work July 12, 2018. Status post redo L4-5 laminectomies and discectomy Mar 04, 2018. EXAM: MRI LUMBAR SPINE WITHOUT AND WITH CONTRAST TECHNIQUE: Multiplanar and multiecho pulse sequences of the lumbar spine were obtained without and with intravenous contrast. CONTRAST:  6 cc Gadavist COMPARISON:  CT lumbar spine July 12, 2018 and MRI lumbar spine Mar 08, 2018 FINDINGS: SEGMENTATION: For the purposes of this report, the last well-formed intervertebral disc is reported as L5-S1. ALIGNMENT: Maintained lumbar lordosis. No malalignment. VERTEBRAE: Vertebral bodies are intact. Stable moderate L1-2, L2-3 disc height loss, mild at L4-5 with mild disc desiccation at these 3 levels. Multilevel mild chronic discogenic endplate changes scattered old Schmorl's nodes. No suspicious or acute bone marrow signal. No abnormal osseous or disc enhancement. Congenital canal narrowing on the basis of foreshortened pedicles. CONUS MEDULLARIS AND CAUDA EQUINA: Conus medullaris terminates at L1-2 and demonstrates normal morphology and signal characteristics. Cauda equina is normal. No abnormal cord, leptomeningeal or epidural  enhancement. No susceptibility artifact to suggest blood products. PARASPINAL AND OTHER SOFT TISSUES: Postoperative paraspinal muscle denervation without focal fluid collection along surgical approach. DISC LEVELS: L1-2: Tiny central disc protrusion without canal stenosis or neural foraminal narrowing. L2-3: Similar 4 mm broad-based disc bulge. Mild canal stenosis. Minimal neural foraminal narrowing. L3-4: Minimal RIGHT subarticular extraforaminal disc protrusion. Moderate LEFT facet arthropathy. No canal stenosis or neural foraminal narrowing. L4-5: Bilateral hemi laminectomies. Bilateral subarticular disc protrusions measuring to 4 mm with enhancing annular fissures. No canal stenosis. Mild-to-moderate RIGHT, moderate to severe LEFT neural foraminal narrowing. L5-S1: L5-S1: No disc bulge. Mild facet arthropathy without canal stenosis or neural foraminal narrowing. IMPRESSION: 1. No  fracture, malalignment or acute osseous process. Status post redo L4-5 laminectomies. 2. Stable mild canal stenosis L2-3. 3. L2-3 and L4-5 neural foraminal narrowing: Moderate to severe on the LEFT at L4-5. Electronically Signed   By: Elon Alas M.D.   On: 07/13/2018 21:44    Microbiology: No results found for this or any previous visit (from the past 240 hour(s)).   Labs: Basic Metabolic Panel: Recent Labs  Lab 07/16/18 0420 07/19/18 0521  NA 138  --   K 4.5  --   CL 100  --   CO2 30  --   GLUCOSE 132*  --   BUN 10  --   CREATININE 0.87 0.89  CALCIUM 9.0  --   MG 2.0  --    Liver Function Tests: No results for input(s): AST, ALT, ALKPHOS, BILITOT, PROT, ALBUMIN in the last 168 hours. No results for input(s): LIPASE, AMYLASE in the last 168 hours. No results for input(s): AMMONIA in the last 168 hours. CBC: Recent Labs  Lab 07/16/18 0420  WBC 17.4*  HGB 12.9  HCT 39.9  MCV 90.1  PLT 262   Cardiac Enzymes: Recent Labs  Lab 07/17/18 0750  TROPONINI <0.03   BNP: BNP (last 3 results) No  results for input(s): BNP in the last 8760 hours.  ProBNP (last 3 results) No results for input(s): PROBNP in the last 8760 hours.  CBG: No results for input(s): GLUCAP in the last 168 hours.     Signed:  Florencia Reasons MD, PhD  [facility] 07/20/2018, 12:12 PM

## 2018-07-20 NOTE — Progress Notes (Signed)
Inpatient Rehabilitation  Note that patient's procedure will be tomorrow and have reviewed case with IP Rehab team and workers comp CM, Event organiser.  Plan to proceeded with admission to [facility] today and will coordinate timing of procedure with therapy schedule.  Patient in agreement with plan.  Call if questions.  Carmelia Roller., CCC/SLP Admission Coordinator  [facility]  Cell [phone]

## 2018-07-20 NOTE — Consult Note (Signed)
Chief Complaint: Patient was seen in consultation today for Lumbar epidural injection Chief Complaint  Patient presents with  . Fall  . Back Pain   at the request of Dr Dillard Cannon  Referring Physician(s): Dr Tonita Cong  Supervising Physician: Marybelle Killings  Patient Status: [facility] - In-pt  History of Present Illness: [name] is a 57 y.o. female   Hx Back surgery 2017 - redo L4-5 laminectomies and discectomy in 02/2018 [place] while trying to sit onto rolling chair-- fell onto back last week Has had back pain and radiation to left leg/toes  MRI 9/17: IMPRESSION: 1. No fracture, malalignment or acute osseous process. Status post redo L4-5 laminectomies. 2. Stable mild canal stenosis L2-3. 3. L2-3 and L4-5 neural foraminal narrowing: Moderate to severe on the LEFT at L4-5.  Has had injections with Dr Nelva Bush as OP 2 yrs ago--- good relief  Request now for Lumbar epidural steroid injection in IR Plan for tomorrow with Dr Barbie Banner   Past Medical History:  Diagnosis Date  . Anxiety   . Back pain    related to spinal stenosis and disc problem, radiates down left buttocks to leg., weakness occ.  [name] Dyspnea   . Headache   . Hyperlipidemia   . Hypertension   . PONV (postoperative nausea and vomiting)   . Vaginal foreign object    "Uses Femring"    Past Surgical History:  Procedure Laterality Date  . ABDOMINAL HYSTERECTOMY    . CARDIAC CATHETERIZATION N/A 04/18/2015   Procedure: Left Heart Cath and Coronary Angiography;  Surgeon: [name], MD;  Location: [place] [facility];  Service: Cardiovascular;  Laterality: N/A;  . FOOT SURGERY Bilateral    Stokes "bunion,bone spur, tendon" (1) -6'16, (1)-10'16  . LUMBAR LAMINECTOMY/DECOMPRESSION MICRODISCECTOMY Bilateral 12/28/2015   Procedure: MICRO LUMBAR DECOMPRESSION L4 - L5 BILATERALLY;  Surgeon: [name], MD;  Location: [facility];  Service: Orthopedics;  Laterality: Bilateral;  . LUMBAR LAMINECTOMY/DECOMPRESSION  MICRODISCECTOMY Bilateral 03/04/2018   Procedure: Revision of Microlumbar Decompression Bilateral Lumbar Four-Five;  Surgeon: [name], MD;  Location: [place];  Service: Orthopedics;  Laterality: Bilateral;  90 mins  . WISDOM TOOTH EXTRACTION    . WOUND EXPLORATION N/A 03/04/2018   Procedure: EXPLORATION OF LUMBAR DECOMPRESSION WOUND;  Surgeon: [name], MD;  Location: [place];  Service: Orthopedics;  Laterality: N/A;    Allergies: Cephalosporins; Penicillins; Anesthetics, amide; Peach [prunus persica]; and Latex  Medications: Prior to Admission medications   Medication Sig Start Date End Date Taking? Authorizing Provider  acetaminophen (TYLENOL) 325 MG tablet Take 1-2 tablets (325-650 mg total) by mouth every 4 (four) hours as needed for mild pain. 03/19/18  Yes Love, Ivan Anchors, PA-C  acidophilus (RISAQUAD) CAPS capsule Take 1 capsule by mouth daily. 03/20/18  Yes Love, Ivan Anchors, PA-C  ALPRAZolam (XANAX) 0.25 MG tablet Take 1 tablet (0.25 mg total) by mouth daily as needed for anxiety or sleep. 03/19/18  Yes Love, Ivan Anchors, PA-C  atorvastatin (LIPITOR) 20 MG tablet Take 1 tablet (20 mg total) by mouth daily. 04/27/18  Yes [name], [name], MD  carvedilol (COREG) 6.25 MG tablet TAKE 1 TABLET BY MOUTH 2 TIMES DAILY. Patient taking differently: Take 6.25 mg by mouth 2 (two) times daily with a meal.  05/24/18  Yes [name], [name], MD  docusate sodium (COLACE) 100 MG capsule Take 1 capsule (100 mg total) by mouth 2 (two) times daily. Patient taking differently: Take 100 mg by mouth 2 (two) times  daily as needed for mild constipation.  03/19/18  Yes Love, Ivan Anchors, PA-C  EPINEPHrine 0.3 mg/0.3 mL IJ SOAJ injection Inject 0.3 mg into the muscle once. For severe allergic reaction 11/13/17  Yes [provider]  gabapentin (NEURONTIN) 300 MG capsule Take 1 capsule (300 mg total) by mouth 3 (three) times daily. 05/03/18  Yes Meredith Staggers, MD  LORazepam (ATIVAN) 0.5 MG tablet Take 0.5-1 tablets  (0.25-0.5 mg total) by mouth daily as needed for anxiety. 04/27/18  Yes [name], [name], MD  methocarbamol (ROBAXIN) 500 MG tablet Take 1 tablet (500 mg total) by mouth every 6 (six) hours as needed for muscle spasms. 05/03/18  Yes Meredith Staggers, MD  polyethylene glycol [facility] Floria Raveling) packet Take 17 g by mouth daily. 03/20/18  Yes Love, Ivan Anchors, PA-C  PREMARIN 0.625 MG tablet Take 0.625 mg by mouth daily. 12/09/17  Yes [provider]  traMADol (ULTRAM) 50 MG tablet Take 1 tablet (50 mg total) by mouth every 6 (six) hours as needed for moderate pain. 07/05/18  Yes Meredith Staggers, MD     Family History  Problem Relation Age of Onset  . Heart attack Mother   . Lung cancer Father   . Cancer Father   . Pancreatic cancer Sister   . Breast cancer Sister 61  . Throat cancer Brother   . Multiple myeloma Sister   . Breast cancer Sister        diagnosed in her 6's  . Heart attack Sister   . Stomach cancer Cousin   . Colon cancer Neg Hx     Social History   Socioeconomic History  . Marital status: Married    Spouse name: Not on file  . Number of children: Not on file  . Years of education: Not on file  . Highest education level: Not on file  Occupational History  . Not on file  Social Needs  . Financial resource strain: Not on file  . Food insecurity:    Worry: Not on file    Inability: Not on file  . Transportation needs:    Medical: Not on file    Non-medical: Not on file  Tobacco Use  . Smoking status: Never Smoker  . Smokeless tobacco: Never Used  Substance and Sexual Activity  . Alcohol use: No  . Drug use: No  . Sexual activity: Not on file  Lifestyle  . Physical activity:    Days per week: Not on file    Minutes per session: Not on file  . Stress: Not on file  Relationships  . Social connections:    Talks on phone: Not on file    Gets together: Not on file    Attends religious service: Not on file    Active member of club or organization: Not on  file    Attends meetings of clubs or organizations: Not on file    Relationship status: Not on file  Other Topics Concern  . Not on file  Social History Narrative  . Not on file    Review of Systems: A 12 point ROS discussed and pertinent positives are indicated in the HPI above.  All other systems are negative.  Review of Systems  Constitutional: Positive for activity change. Negative for fatigue and fever.  Gastrointestinal: Negative for abdominal pain.  Musculoskeletal: Positive for back pain.  Neurological: Positive for weakness.  Psychiatric/Behavioral: Negative for behavioral problems and confusion.    Vital Signs: BP [name])  150/89 (BP Location: Left Arm)   Pulse 76   Temp 97.7 F (36.5 C) (Oral)   Resp 16   Ht '5\' 5"'$  (1.651 m)   Wt 147 lb (66.7 kg)   SpO2 100%   BMI 24.46 kg/m   Physical Exam  Constitutional: She is oriented to person, place, and time.  Cardiovascular: Normal rate and regular rhythm.  Pulmonary/Chest: Effort normal and breath sounds normal.  Abdominal: Soft. Bowel sounds are normal.  Musculoskeletal: Normal range of motion.  Right leg/foot without sensation much at all Left leg painful to move-- feels pain to toes  Neurological: She is oriented to person, place, and time.  Skin: Skin is warm and dry.  Psychiatric: She has a normal mood and affect. Her behavior is normal. Judgment and thought content normal.  Vitals reviewed.   Imaging: Dg Lumbar Spine Complete  Result Date: 07/08/2018 CLINICAL DATA:  Hx laminectomy 03/04/2018, hx of a fall 3 days ago, low back pain EXAM: LUMBAR SPINE - COMPLETE 4+ VIEW COMPARISON:  03/04/2018 FINDINGS: There has been subtle change in the appearance of the superior endplate of L3, raising the question of superior endplate fracture superimposed on degenerative disc disease. No retropulsion. There is normal alignment of the lumbar spine. Disc height loss identified at L2-3 and associated with uncovertebral spurring.  There is mild facet hypertrophy in the LOWER levels. Laminectomy at L4-5. No suspicious lytic or blastic lesions are identified. IMPRESSION: Question of superior endplate fracture of L3.  Normal alignment. Electronically Signed   By: Nolon Nations M.D.   On: 07/08/2018 09:22   Ct Thoracic Spine Wo Contrast  Result Date: 07/12/2018 CLINICAL DATA:  Fall.  Back pain.  History of lumbar surgery. EXAM: CT THORACIC AND LUMBAR SPINE WITHOUT CONTRAST TECHNIQUE: Multidetector CT imaging of the thoracic and lumbar spine was performed without contrast. Multiplanar CT image reconstructions were also generated. COMPARISON:  None. FINDINGS: CT THORACIC SPINE FINDINGS Alignment: Normal. Vertebrae: No acute fracture or focal pathologic process. Paraspinal and other soft tissues: Negative. Disc levels: No visible disc herniation or spinal canal stenosis. CT LUMBAR SPINE FINDINGS Segmentation: Standard Alignment: Root normal Vertebrae: No acute fracture or focal pathologic process. Paraspinal and other soft tissues: Mild calcific aortic atherosclerosis. Disc levels: There is no traumatic disc herniation, spinal canal stenosis or nerve root impingement. The patient has undergone bilateral decompressive laminectomies at the L4-5 level. There is moderate left facet hypertrophy at L3-4. IMPRESSION: CT THORACIC SPINE IMPRESSION No acute abnormality or evidence of spinal canal or neural foraminal stenosis. CT LUMBAR SPINE IMPRESSION Postsurgical changes of bilateral L4 laminectomies. No acute abnormality, spinal canal stenosis or nerve root impingement. Electronically Signed   By: Ulyses Jarred M.D.   On: 07/12/2018 14:44   Ct Lumbar Spine Wo Contrast  Result Date: 07/12/2018 CLINICAL DATA:  Fall.  Back pain.  History of lumbar surgery. EXAM: CT THORACIC AND LUMBAR SPINE WITHOUT CONTRAST TECHNIQUE: Multidetector CT imaging of the thoracic and lumbar spine was performed without contrast. Multiplanar CT image reconstructions were  also generated. COMPARISON:  None. FINDINGS: CT THORACIC SPINE FINDINGS Alignment: Normal. Vertebrae: No acute fracture or focal pathologic process. Paraspinal and other soft tissues: Negative. Disc levels: No visible disc herniation or spinal canal stenosis. CT LUMBAR SPINE FINDINGS Segmentation: Standard Alignment: Root normal Vertebrae: No acute fracture or focal pathologic process. Paraspinal and other soft tissues: Mild calcific aortic atherosclerosis. Disc levels: There is no traumatic disc herniation, spinal canal stenosis or nerve root impingement. The patient has  undergone bilateral decompressive laminectomies at the L4-5 level. There is moderate left facet hypertrophy at L3-4. IMPRESSION: CT THORACIC SPINE IMPRESSION No acute abnormality or evidence of spinal canal or neural foraminal stenosis. CT LUMBAR SPINE IMPRESSION Postsurgical changes of bilateral L4 laminectomies. No acute abnormality, spinal canal stenosis or nerve root impingement. Electronically Signed   By: Ulyses Jarred M.D.   On: 07/12/2018 14:44   Mr Lumbar Spine W Wo Contrast  Result Date: 07/13/2018 CLINICAL DATA:  Low back pain, fall at work July 12, 2018. Status post redo L4-5 laminectomies and discectomy Mar 04, 2018. EXAM: MRI LUMBAR SPINE WITHOUT AND WITH CONTRAST TECHNIQUE: Multiplanar and multiecho pulse sequences of the lumbar spine were obtained without and with intravenous contrast. CONTRAST:  6 cc Gadavist COMPARISON:  CT lumbar spine July 12, 2018 and MRI lumbar spine Mar 08, 2018 FINDINGS: SEGMENTATION: For the purposes of this report, the last well-formed intervertebral disc is reported as L5-S1. ALIGNMENT: Maintained lumbar lordosis. No malalignment. VERTEBRAE: Vertebral bodies are intact. Stable moderate L1-2, L2-3 disc height loss, mild at L4-5 with mild disc desiccation at these 3 levels. Multilevel mild chronic discogenic endplate changes scattered old Schmorl's nodes. No suspicious or acute bone marrow  signal. No abnormal osseous or disc enhancement. Congenital canal narrowing on the basis of foreshortened pedicles. CONUS MEDULLARIS AND CAUDA EQUINA: Conus medullaris terminates at L1-2 and demonstrates normal morphology and signal characteristics. Cauda equina is normal. No abnormal cord, leptomeningeal or epidural enhancement. No susceptibility artifact to suggest blood products. PARASPINAL AND OTHER SOFT TISSUES: Postoperative paraspinal muscle denervation without focal fluid collection along surgical approach. DISC LEVELS: L1-2: Tiny central disc protrusion without canal stenosis or neural foraminal narrowing. L2-3: Similar 4 mm broad-based disc bulge. Mild canal stenosis. Minimal neural foraminal narrowing. L3-4: Minimal RIGHT subarticular extraforaminal disc protrusion. Moderate LEFT facet arthropathy. No canal stenosis or neural foraminal narrowing. L4-5: Bilateral hemi laminectomies. Bilateral subarticular disc protrusions measuring to 4 mm with enhancing annular fissures. No canal stenosis. Mild-to-moderate RIGHT, moderate to severe LEFT neural foraminal narrowing. L5-S1: L5-S1: No disc bulge. Mild facet arthropathy without canal stenosis or neural foraminal narrowing. IMPRESSION: 1. No fracture, malalignment or acute osseous process. Status post redo L4-5 laminectomies. 2. Stable mild canal stenosis L2-3. 3. L2-3 and L4-5 neural foraminal narrowing: Moderate to severe on the LEFT at L4-5. Electronically Signed   By: Elon Alas M.D.   On: 07/13/2018 21:44    Labs:  CBC: Recent Labs    03/09/18 1444 03/12/18 0853 07/12/18 1709 07/16/18 0420  WBC 7.7 5.1 4.9 17.4*  HGB 10.0* 10.7* 11.3* 12.9  HCT 31.8* 33.7* 35.7* 39.9  PLT 236 286 238 262    COAGS: No results for input(s): INR, APTT in the last 8760 hours.  BMP: Recent Labs    03/10/18 0705 03/12/18 0853 07/12/18 1709 07/16/18 0420 07/19/18 0521  NA 136 140 141 138  --   K 4.1 4.1 3.4* 4.5  --   CL 96* 102 108 100  --     CO2 31 32 28 30  --   GLUCOSE 98 68 75 132*  --   BUN '10 15 9 10  '$ --   CALCIUM 8.5* 8.9 8.0* 9.0  --   CREATININE 1.05* 1.12* 0.83 0.87 0.89  GFRNONAA 58* 54* >60 >60 >60  GFRAA >60 >60 >60 >60 >60    LIVER FUNCTION TESTS: Recent Labs    03/10/18 0705  BILITOT 0.3  AST 21  ALT 33  ALKPHOS 52  PROT 5.5*  ALBUMIN 2.8*    TUMOR MARKERS: No results for input(s): AFPTM, CEA, CA199, CHROMGRNA in the last 8760 hours.  Assessment and Plan:  Low back pain after fall Hx back surgery 2017 and redo 02/2018 Pain in back radiates to left leg to toes MRI shows 3. L2-3 and L4-5 neural foraminal narrowing: Moderate to severe on the LEFT at L4-5. Scheduled now for Lumbar epidural steroid injection in IR Pt is aware of procedure benefits and risks including but not limited to: infection; bleeding; damage to surrounding structures Agreeable to proceed Consent signed and in chart  Thank you for this interesting consult.  I greatly enjoyed meeting PURA PICINICH and look forward to participating in their care.  A copy of this report was sent to the requesting provider on this date.  Electronically Signed: Norissa Bartee A, PA-C 07/20/2018, 1:20 PM   I spent a total of 20 Minutes    in face to face in clinical consultation, greater than 50% of which was counseling/coordinating care for L ESI

## 2018-07-20 NOTE — Progress Notes (Signed)
Spoke to PA, order changed. Will follow up with IR.

## 2018-07-20 NOTE — Progress Notes (Signed)
Report that patient c/o dizziness while working with therapy. On assessment patient up in chair. Alert and verbal. Denies feeling dizzy at this time. B/P rechecked 150/89. Patient stated I did not pass out I just felt tired and I had to sit back down, and I think its from the prune juice I had this morning. Patient resting in recliner watching TV. Will cont to monitor.

## 2018-07-20 NOTE — Progress Notes (Signed)
Patient transferring to [phone]. Report called in to [facility]. Patient took all personal belongings.

## 2018-07-20 NOTE — Progress Notes (Signed)
Called IR to follow up on when patient will receive the epidural injection. They stated that the order has to be changed to IR epidurography. Called PA [place]  and unable to leave voicemail d/t voicemail being full. Will attempt later.

## 2018-07-20 NOTE — H&P (Signed)
Physical Medicine and Rehabilitation Admission H&P       Chief Complaint  Patient presents with  . Fall  . Back Pain  : HPI: [name]. Schauf is a 57 year old right-handed female history of hypertension, chronic back pain with history of lumbar decompression May 2019 by [name] receiving inpatient rehab services for radiculopathy.  She lives with spouse.  Used a cane prior to admission and a right AFO.  Presented 07/12/2018 by report patient was back at work part-time sitting on a rolling wheelchair when she slid out from under her and she fell to the floor landing on her buttocks.  Denied loss of consciousness.  MRI lumbar spine showed no fracture malalignment L2-3 and L4-5 neural foraminal narrowing.  Noted small annular tear at L4-5 with a small central disc protrusion.  CT thoracic spine unremarkable.  Follow-up orthopedic services [name] with conservative care.  Plan for ESI L4-5 due to ongoing pain per interventional radiology.  Hospital course ongoing pain management.  Therapy evaluations completed with recommendations of physical medicine rehab consult.  Patient was admitted for a comprehensive rehab program.  Review of Systems  Constitutional: Negative for fever.  HENT: Negative for hearing loss.   Eyes: Negative for blurred vision and double vision.  Respiratory: Negative for shortness of breath.   Cardiovascular: Negative for chest pain, palpitations and leg swelling.  Gastrointestinal: Positive for constipation. Negative for nausea and vomiting.  Musculoskeletal: Positive for back pain.  Skin: Negative for rash.  Neurological: Positive for headaches.  Psychiatric/Behavioral:       Anxiety  All other systems reviewed and are negative.      Past Medical History:  Diagnosis Date  . Anxiety   . Back pain    related to spinal stenosis and disc problem, radiates down left buttocks to leg., weakness occ.  [name] Dyspnea   . Headache   . Hyperlipidemia   .  Hypertension   . PONV (postoperative nausea and vomiting)   . Vaginal foreign object    "Uses Femring"        Past Surgical History:  Procedure Laterality Date  . ABDOMINAL HYSTERECTOMY    . CARDIAC CATHETERIZATION N/A 04/18/2015   Procedure: Left Heart Cath and Coronary Angiography;  Surgeon: [name], MD;  Location: [facility];  Service: Cardiovascular;  Laterality: N/A;  . FOOT SURGERY Bilateral    [place] "bunion,bone spur, tendon" (1) -6'16, (1)-10'16  . LUMBAR LAMINECTOMY/DECOMPRESSION MICRODISCECTOMY Bilateral 12/28/2015   Procedure: MICRO LUMBAR DECOMPRESSION L4 - L5 BILATERALLY;  Surgeon: [name], MD;  Location: [facility];  Service: Orthopedics;  Laterality: Bilateral;  . LUMBAR LAMINECTOMY/DECOMPRESSION MICRODISCECTOMY Bilateral 03/04/2018   Procedure: Revision of Microlumbar Decompression Bilateral Lumbar Four-Five;  Surgeon: [name], MD;  Location: [facility];  Service: Orthopedics;  Laterality: Bilateral;  90 mins  . WISDOM TOOTH EXTRACTION    . WOUND EXPLORATION N/A 03/04/2018   Procedure: EXPLORATION OF LUMBAR DECOMPRESSION WOUND;  Surgeon: [name], MD;  Location: [facility];  Service: Orthopedics;  Laterality: N/A;        Family History  Problem Relation Age of Onset  . Heart attack Mother   . Lung cancer Father   . Cancer Father   . Pancreatic cancer Sister   . Breast cancer Sister 51  . Throat cancer Brother   . Multiple myeloma Sister   . Breast cancer Sister        diagnosed in her 86's  . Heart attack Sister   .  Stomach cancer Cousin   . Colon cancer Neg Hx    Social History:  reports that she has never smoked. She has never used smokeless tobacco. She reports that she does not drink alcohol or use drugs. Allergies:       Allergies  Allergen Reactions  . Cephalosporins Anaphylaxis  . Penicillins Anaphylaxis and Hives    Has patient had a PCN reaction causing immediate rash, facial/tongue/throat  swelling, SOB or lightheadedness with hypotension: Yes Has patient had a PCN reaction causing severe rash involving mucus membranes or skin necrosis: Yes Has patient had a PCN reaction that required hospitalization Yes Has patient had a PCN reaction occurring within the last 10 years: No If all of the above answers are "NO", then may proceed with Cephalosporin use.   . Anesthetics, Amide Nausea And Vomiting    Does not know name of it. States they put it on record foot center.   [name] Peach [Prunus Persica] Hives  . Latex Hives, Itching and Rash         Medications Prior to Admission  Medication Sig Dispense Refill  . acetaminophen (TYLENOL) 325 MG tablet Take 1-2 tablets (325-650 mg total) by mouth every 4 (four) hours as needed for mild pain.    [name] acidophilus (RISAQUAD) CAPS capsule Take 1 capsule by mouth daily.    [name] ALPRAZolam (XANAX) 0.25 MG tablet Take 1 tablet (0.25 mg total) by mouth daily as needed for anxiety or sleep. 5 tablet 0  . atorvastatin (LIPITOR) 20 MG tablet Take 1 tablet (20 mg total) by mouth daily. 90 tablet 3  . carvedilol (COREG) 6.25 MG tablet TAKE 1 TABLET BY MOUTH 2 TIMES DAILY. (Patient taking differently: Take 6.25 mg by mouth 2 (two) times daily with a meal. ) 180 tablet 1  . docusate sodium (COLACE) 100 MG capsule Take 1 capsule (100 mg total) by mouth 2 (two) times daily. (Patient taking differently: Take 100 mg by mouth 2 (two) times daily as needed for mild constipation. ) 60 capsule 1  . EPINEPHrine 0.3 mg/0.3 mL IJ SOAJ injection Inject 0.3 mg into the muscle once. For severe allergic reaction    . gabapentin (NEURONTIN) 300 MG capsule Take 1 capsule (300 mg total) by mouth 3 (three) times daily. 90 capsule 4  . LORazepam (ATIVAN) 0.5 MG tablet Take 0.5-1 tablets (0.25-0.5 mg total) by mouth daily as needed for anxiety. 30 tablet 1  . methocarbamol (ROBAXIN) 500 MG tablet Take 1 tablet (500 mg total) by mouth every 6 (six) hours as needed for muscle  spasms. 120 tablet 2  . polyethylene glycol (MIRALAX / GLYCOLAX) packet Take 17 g by mouth daily. 30 each 0  . PREMARIN 0.625 MG tablet Take 0.625 mg by mouth daily.  7  . traMADol (ULTRAM) 50 MG tablet Take 1 tablet (50 mg total) by mouth every 6 (six) hours as needed for moderate pain. 21 tablet 0    Drug Regimen Review Drug regimen was reviewed and remains appropriate with no significant issues identified  Home: Home Living Family/patient expects to be discharged to:: Private residence Living Arrangements: Spouse/significant other Available Help at Discharge: Family, Available 24 hours/day Type of Home: House Home Access: Stairs to enter [employer] of Steps: 4 Entrance Stairs-Rails: Right, Left, Can reach both Home Layout: Able to live on main level with bedroom/bathroom, One level Bathroom Shower/Tub: Multimedia programmer: Standard Bathroom Accessibility: Yes Home Equipment: Shower seat, Cane - single point, Environmental consultant - 2 wheels, Bedside  commode   Functional History: Prior Function Level of Independence: Independent with assistive device(s) Comments: ambulated with RW secondary to R foot drop/numbness, independent ADLs   Functional Status:  Mobility: Bed Mobility Overal bed mobility: Needs Assistance Bed Mobility: Rolling, Sidelying to Sit, Sit to Sidelying Rolling: Supervision Sidelying to sit: Min assist Sit to sidelying: Min assist General bed mobility comments: Pt received in recliner. Transfers Overall transfer level: Needs assistance Equipment used: Rolling walker (2 wheeled) Transfers: Sit to/from Stand Sit to Stand: Mod assist General transfer comment: assist to power up, increased time to stabilize initial standing balance and achieve full, upright posture Ambulation/Gait Ambulation/Gait assistance: Min assist Gait Distance (Feet): 10 Feet Assistive device: Rolling walker (2 wheeled) Gait Pattern/deviations: Step-to pattern,  Decreased stride length General Gait Details: slow, guard gait; multi episodes of R knee buckling requiring min assist to maintain stance Gait velocity: decreased Gait velocity interpretation: <1.31 ft/sec, indicative of household ambulator  ADL: ADL Overall ADL's : Needs assistance/impaired Grooming: Wash/dry hands, Set up, Supervision/safety, Sitting Upper Body Bathing: Supervision/ safety, Set up, Sitting Lower Body Bathing: Sit to/from stand, Cueing for back precautions, Cueing for compensatory techniques, Cueing for safety, Moderate assistance Lower Body Bathing Details (indicate cue type and reason): assist for B feet and buttocks, min assist in standing  Upper Body Dressing : Set up, Sitting Lower Body Dressing: Maximal assistance, Cueing for compensatory techniques, Cueing for safety, Cueing for back precautions, Sit to/from stand Lower Body Dressing Details (indicate cue type and reason): requires total assist for B shoes, cueing to adhere to back precautions; min assist in standing  Toilet Transfer: Moderate assistance, Ambulation, BSC, Regular Toilet, RW(3:1 over toilet ) Toilet Transfer Details (indicate cue type and reason): requires mod assist to ascend from commode and maintain balance during transition Toileting- Clothing Manipulation and Hygiene: Moderate assistance, Sit to/from stand, Cueing for compensatory techniques, Cueing for back precautions Toileting - Clothing Manipulation Details (indicate cue type and reason): requires assist for clothing management up and down due to impaired balacne Functional mobility during ADLs: Minimal assistance, Rolling walker(grossly min assist, several LOB with mod-max A to recover ) General ADL Comments: Patinet highly motivated, limited by pain in back, weakness, balance and fear of falling.  Noted several LOB during mobility and static standing requiring mod-max A to regain balance.   Cognition: Cognition Overall Cognitive Status:  Within Functional Limits for tasks assessed Orientation Level: Oriented X4 Cognition Arousal/Alertness: Awake/alert Behavior During Therapy: WFL for tasks assessed/performed Overall Cognitive Status: Within Functional Limits for tasks assessed General Comments: tearful regarding lack of relief from pain  Physical Exam: Blood pressure (!) 174/91, pulse 66, temperature 97.7 F (36.5 C), temperature source Oral, resp. rate 17, height '5\' 5"'$  (1.651 m), weight 66.7 kg, SpO2 100 %. Physical Exam  Constitutional: She appears well-developed.  HENT:  Head: Normocephalic and atraumatic.  Eyes: Pupils are equal, round, and reactive to light. EOM are normal.  Neck: Normal range of motion.  Cardiovascular: Normal rate and regular rhythm. Exam reveals no friction rub.  No murmur heard. Respiratory: Effort normal and breath sounds normal. No respiratory distress. She has no wheezes.  GI: Soft. She exhibits no distension.  Musculoskeletal:  Low back TTP. Limited flexion and extension while seated in bed. SLR + L>R.   Neurological: No cranial nerve deficit. Coordination normal.  UE 5/5. LE 1-3/4 and inconsistent with effort. Inconsistent sensory loss bilateral lower ext. DTR's absent in LE's.    Skin: No rash noted. No erythema.  Psychiatric:  She has a normal mood and affect. Her behavior is normal. Judgment and thought content normal.    LabResultsLast48Hours  Results for orders placed or performed during the hospital encounter of 07/12/18 (from the past 48 hour(s))  Creatinine, serum     Status: None   Collection Time: 07/19/18  5:21 AM  Result Value Ref Range   Creatinine, Ser 0.89 0.44 - 1.00 mg/dL   GFR calc non Af Amer >60 >60 mL/min   GFR calc Af Amer >60 >60 mL/min    Comment: (NOTE) The eGFR has been calculated using the CKD EPI equation. This calculation has not been validated in all clinical situations. eGFR's persistently <60 mL/min signify possible Chronic  Kidney Disease. Performed at [facility], [place] [address], [place], [place] [address]      ImagingResults(Last48hours)  No results found.       Medical Problem List and Plan: 1.  Decreased functional mobility secondary to acute on chronic lumbar radiculopathy and foraminal stenosis bilaterally with decompression May 2019.  Recent fall with exacerbation of pain and motor or sensory deficits.              -admit to inpatient rehab             - Interventional radiology to follow-up for planned ESI L4-5 9/24, ?left trans-foraminal 2.  DVT Prophylaxis/Anticoagulation: SCDs. 3. Pain Management: Neurontin 300 mg 3 times daily, Robaxin 500 mg 3 times daily, Ultram 50 mg every 12 hours, oxycodone as needed             -consider further titration             -mobilize as much as possible 4. Mood: Ativan 0.25-0.5 mg daily as needed 5. Neuropsych: This patient is capable of making decisions on her own behalf. 6. Skin/Wound Care: Routine skin checks 7. Fluids/Electrolytes/Nutrition: Routine in and outs with follow-up chemistries 8.  Hypertension.  Coreg 6.25 mg twice daily.  Monitor with increased mobility 9.  Hyperlipidemia.  Lipitor 20 mg daily. 10.  Constipation.  Laxative assistance    Post Admission Physician Evaluation: 1. Functional deficits secondary  to lumbar stenosis with radiculopathy. 2. Patient is admitted to receive collaborative, interdisciplinary care between the physiatrist, rehab nursing staff, and therapy team. 3. Patient's level of medical complexity and substantial therapy needs in context of that medical necessity cannot be provided at a lesser intensity of care such as a SNF. 4. Patient has experienced substantial functional loss from his/her baseline which was documented above under the "Functional History" and "Functional Status" headings.  Judging by the patient's diagnosis, physical exam, and functional history, the patient has potential for  functional progress which will result in measurable gains while on inpatient rehab.  These gains will be of substantial and practical use upon discharge  in facilitating mobility and self-care at the household level. 5. Physiatrist will provide 24 hour management of medical needs as well as oversight of the therapy plan/treatment and provide guidance as appropriate regarding the interaction of the two. 6. The Preadmission Screening has been reviewed and patient status is unchanged unless otherwise stated above. 7. 24 hour rehab nursing will assist with bladder management, bowel management, safety, skin/wound care, disease management, medication administration, pain management and patient education  and help integrate therapy concepts, techniques,education, etc. 8. PT will assess and treat for/with: Lower extremity strength, range of motion, stamina, balance, functional mobility, safety, adaptive techniques and equipment, NMR, pain control, orthotic.   Goals are: mod  I. 9. OT will assess and treat for/with: ADL's, functional mobility, safety, upper extremity strength, adaptive techniques and equipment, NMR, PAIN CONTROL, orthotics.   Goals are: mod I. Therapy may proceed with showering this patient. 10. SLP will assess and treat for/with: n/a.  Goals are: n/a. 11. Case Management and Social Worker will assess and treat for psychological issues and discharge planning. 12. Team conference will be held weekly to assess progress toward goals and to determine barriers to discharge. 13. Patient will receive at least 3 hours of therapy per day at least 5 days per week. 14. ELOS: 7 days       15. Prognosis:  excellent   I have personally performed a face to face diagnostic evaluation of this patient and formulated the key components of the plan.  Additionally, I have personally reviewed laboratory data, imaging studies, as well as relevant notes and concur with the physician assistant's documentation  above.  Meredith Staggers, MD, FAAPMR    Lavon Paganini [place], PA-C 07/19/2018

## 2018-07-20 NOTE — Progress Notes (Signed)
Physical Therapy Treatment Patient Details Name: [name] MRN: [id number] DOB: 15-Apr-1961 Today's Date: 07/20/2018    History of Present Illness Pt is a 57 y.o. female presenting with back pain after a fall. PMH is significant for previous back surgery in May 2019, HLD, HTN, and anxiety.    PT Comments    Pt performed gait training and functional mobility during session this am.  Opted for close chair follow due to buckling from previous session.  Pt presents with improved ability initiall but quickly became dizzy and required mod to max assistance for transfers. After 8 feet of gt training complain of dizziness and returned patient to seated BP 184/93.  After rest break stood to obtain a standing BP and patient with LOB posterior with near syncopal event.  BP 183/73.  Informed nursing and terminated tx based on symptoms.  Plan remains appropriate for aggressive therapies in CIR setting to improve strength and function before returning home.    Follow Up Recommendations  CIR;Supervision/Assistance - 24 hour     Equipment Recommendations  None recommended by PT    Recommendations for Other Services       Precautions / Restrictions Restrictions Weight Bearing Restrictions: No    Mobility  Bed Mobility               General bed mobility comments: Pt OOB in recliner at beginning and end of session  Transfers Overall transfer level: Needs assistance Equipment used: Rolling walker (2 wheeled) Transfers: Sit to/from Stand Sit to Stand: Min assist;Max assist;Mod assist;+2 safety/equipment(assist levels varried.  )         General transfer comment: min assistance to power up unsteadiness in B LEs but no true buckling.  Pt able to follow commands after short gt trial to return to seated position.  Stood one more time to see if BP was decreasing with position changes and she required moderated assistance to stand and max assistance to sit with LOB from dizziness.     Ambulation/Gait Ambulation/Gait assistance: Mod assist;+2 safety/equipment Gait Distance (Feet): 8 Feet Assistive device: Rolling walker (2 wheeled) Gait Pattern/deviations: Step-to pattern;Decreased stride length     General Gait Details: Cues for sequencing and use of UEs in stance phase to avoid buckling.  Pt limited due to complaints of dizziness and require close chair follow from behind.     Stairs             Wheelchair Mobility    Modified Rankin (Stroke Patients Only)       Balance Overall balance assessment: Needs assistance   Sitting balance-Leahy Scale: Good     Standing balance support: Bilateral upper extremity supported Standing balance-Leahy Scale: Poor Standing balance comment: heavy reliance on BUE, strong LOB in standing likely due to dizziness but required max assistance to recover.                              Cognition Arousal/Alertness: Awake/alert Behavior During Therapy: WFL for tasks assessed/performed Overall Cognitive Status: Within Functional Limits for tasks assessed                                        Exercises      General Comments        Pertinent Vitals/Pain Pain Assessment: 0-10 Pain Score: 6  Pain Location: low back radiating  down legs Pain Descriptors / Indicators: Radiating;Guarding;Grimacing Pain Intervention(s): Monitored during session;Repositioned    Home Living                      Prior Function            PT Goals (current goals can now be found in the care plan section) Acute Rehab PT Goals Patient Stated Goal: to get stronger  Potential to Achieve Goals: Good Progress towards PT goals: Progressing toward goals    Frequency    Min 3X/week      PT Plan Current plan remains appropriate    Co-evaluation              AM-PAC PT "6 Clicks" Daily Activity  Outcome Measure  Difficulty turning over in bed (including adjusting bedclothes, sheets and  blankets)?: A Little Difficulty moving from lying on back to sitting on the side of the bed? : A Lot Difficulty sitting down on and standing up from a chair with arms (e.g., wheelchair, bedside commode, etc,.)?: Unable Help needed moving to and from a bed to chair (including a wheelchair)?: A Lot Help needed walking in hospital room?: A Lot Help needed climbing 3-5 steps with a railing? : A Lot 6 Click Score: 12    End of Session Equipment Utilized During Treatment: (R AFO) Activity Tolerance: Patient limited by pain Patient left: in chair;with call bell/phone within reach Nurse Communication: Mobility status PT Visit Diagnosis: Other abnormalities of gait and mobility (R26.89);Difficulty in walking, not elsewhere classified (R26.2);Muscle weakness (generalized) (M62.81);History of falling (Z91.81);Pain;Dizziness and giddiness (R42) Pain - part of body: (back)     Time: 1128-1150 PT Time Calculation (min) (ACUTE ONLY): 22 min  Charges:  $Therapeutic Activity: 8-22 mins                     Governor Rooks, PTA Acute Rehabilitation Services Pager [phone] Office [phone]     Darious Rehman Eli Hose 07/20/2018, 12:01 PM

## 2018-07-20 NOTE — Progress Notes (Signed)
Patient arrived on rehab unit were she was informed about patient safety plan and rehab process.[name]

## 2018-07-20 NOTE — Plan of Care (Signed)

## 2018-07-20 NOTE — Progress Notes (Signed)
Inpatient Rehabilitation  Hopeful to admit patient to IP Rehab today following her procedure for spinal epidural injection.  I have notified bedside RN who is in the process of clarifying OR time for procedure today.  Call if questions.   Carmelia Roller., CCC/SLP Admission Coordinator  [facility]  Cell [phone]

## 2018-07-20 NOTE — PMR Pre-admission (Signed)
PMR Admission Coordinator Pre-Admission Assessment  Patient: [name] is an 57 y.o., female MRN: [id number] DOB: 11-23-1960 Height: 5' 5" (165.1 cm) Weight: 66.7 kg              Insurance Information PRIMARY: Worker's Compensation       Policy#: [id number]      Subscriber: Patient  Adjuster Name: Tyson Alias      Phone#: [phone]      Fax#: [phone] CM Name: Sydnee Levans      Phone#: [phone], likes updates via phone       Pre-Cert#: [id number] verbal approval given by Otila Kluver via voicemail       Employer: [employer]: Per Worker's Comp        Emergency Contact Information Contact Information    Name Relation Home Work Mobile   [place] Spouse [phone]  [phone]   Sh[place], Skilling   [phone]     Current Medical History  Patient Admitting Diagnosis: Pt with chronic lumbar radiculopathy and foraminal stenosis bilaterally. Recent fall with exacerbation of pain and motor/sensory deficits in legs. She is well known to me and struggles with significant anxiety as well.   History of Present Illness: [name] is a 57 year old right-handed female history of hypertension, chronic back pain with history of lumbar decompression May 2019 by [name] received inpatient rehab services for radiculopathy.  She lives with spouse.  Used a rolling walker prior to admission and a right AFO.  Presented 07/12/2018 by report patient was back at work part-time sitting on a rolling wheelchair when she slid out from under her and she fell to the floor landing on her buttocks.  Denied loss of consciousness.  MRI lumbar spine showed no fracture malalignment L2-3 and L4-5 neural foraminal narrowing.  Noted small annular tear at L4-5 with a small central disc protrusion.  CT thoracic spine unremarkable.  Follow-up orthopedic services [name] with conservative care.  Plan for ESI L4-5 due to ongoing pain per interventional radiology.  Hospital course  ongoing pain management.  Therapy evaluations completed with recommendations of physical medicine rehab consult.  Patient was admitted for a comprehensive rehab program 07/20/18.      Past Medical History  Past Medical History:  Diagnosis Date  . Anxiety   . Back pain    related to spinal stenosis and disc problem, radiates down left buttocks to leg., weakness occ.  [name] Dyspnea   . Headache   . Hyperlipidemia   . Hypertension   . PONV (postoperative nausea and vomiting)   . Vaginal foreign object    "Uses Femring"    Family History  family history includes Breast cancer in her sister; Breast cancer (age of onset: 13) in her sister; Cancer in her father; Heart attack in her mother and sister; Lung cancer in her father; Multiple myeloma in her sister; Pancreatic cancer in her sister; Stomach cancer in her cousin; Throat cancer in her brother.  Prior Rehab/Hospitalizations:  Has the patient had major surgery during 100 days prior to admission? No  Current Medications   Current Facility-Administered Medications:  .  0.9 %  sodium chloride infusion, , Intravenous, PRN, Bonnell Public, MD, Stopped at 07/15/18 0245 .  acetaminophen (TYLENOL) tablet 325-650 mg, 325-650 mg, Oral, Q4H PRN, Dana Allan I, MD .  atorvastatin (LIPITOR) tablet 20 mg, 20 mg, Oral, Daily, Dana Allan I, MD, 20 mg at 07/20/18 1055 .  carvedilol (COREG) tablet 6.25 mg, 6.25 mg, Oral, BID,  Dana Allan I, MD, 6.25 mg at 07/20/18 1055 .  estrogens (conjugated) (PREMARIN) tablet 0.625 mg, 0.625 mg, Oral, Daily, Dana Allan I, MD, 0.625 mg at 07/20/18 1103 .  gabapentin (NEURONTIN) capsule 300 mg, 300 mg, Oral, TID, Dana Allan I, MD, 300 mg at 07/20/18 1055 .  Influenza vac split quadrivalent PF (FLUARIX) injection 0.5 mL, 0.5 mL, Intramuscular, Prior to discharge, Florencia Reasons, MD .  LORazepam (ATIVAN) tablet 0.25-0.5 mg, 0.25-0.5 mg, Oral, Daily PRN, Dana Allan I, MD, 0.5 mg at  07/18/18 1620 .  methocarbamol (ROBAXIN) tablet 500 mg, 500 mg, Oral, TID, Florencia Reasons, MD, 500 mg at 07/20/18 1055 .  morphine 2 MG/ML injection 1-2 mg, 1-2 mg, Intravenous, Q4H PRN, Dana Allan I, MD .  ondansetron (ZOFRAN) injection 4 mg, 4 mg, Intravenous, Q6H PRN, Dana Allan I, MD, 4 mg at 07/20/18 1229 .  oxyCODONE-acetaminophen (PERCOCET/ROXICET) 5-325 MG per tablet 1-2 tablet, 1-2 tablet, Oral, Q4H PRN, Dana Allan I, MD, 2 tablet at 07/20/18 0441 .  polyethylene glycol (MIRALAX / GLYCOLAX) packet 17 g, 17 g, Oral, BID, Florencia Reasons, MD, 17 g at 07/20/18 1055 .  senna-docusate (Senokot-S) tablet 1 tablet, 1 tablet, Oral, BID, Florencia Reasons, MD, 1 tablet at 07/20/18 1055 .  traMADol (ULTRAM) tablet 50 mg, 50 mg, Oral, Q12H, Dana Allan I, MD, 50 mg at 07/20/18 1055  Patients Current Diet:  Diet Order            Diet - low sodium heart healthy        Diet regular Room service appropriate? Yes; Fluid consistency: Thin  Diet effective now              Precautions / Restrictions Precautions Precautions: Back Precaution Comments: reviewed 3/3 back precautions Spinal Brace: Other (comment) Spinal Brace Comments: other lumbar brace from home Restrictions Weight Bearing Restrictions: No   Has the patient had 2 or more falls or a fall with injury in the past year?Yes the one that lead to this admission.  Prior Activity Level Community (5-7x/wk): Prior to admission patient had returned to work for short days, Mod I with use of Rt. AFO and rolling walker.  She was here for IP Rehab in May after her back surgery and has a supportive family that can provide intermittent assistance.    Home Assistive Devices / Equipment Home Assistive Devices/Equipment: Cane (specify quad or straight), Brace (specify type)(Back Brace ) Home Equipment: Shower seat, Cane - single point, Walker - 2 wheels, Bedside commode  Prior Device Use: Indicate devices/aids used by the patient prior to  current illness, exacerbation or injury? Walker and Rt. AFO  Prior Functional Level Prior Function Level of Independence: Independent with assistive device(s) Comments: ambulated with RW secondary to R foot drop/numbness, independent ADLs   Self Care: Did the patient need help bathing, dressing, using the toilet or eating? Independent  Indoor Mobility: Did the patient need assistance with walking from room to room (with or without device)? Independent  Stairs: Did the patient need assistance with internal or external stairs (with or without device)? Independent  Functional Cognition: Did the patient need help planning regular tasks such as shopping or remembering to take medications? Independent  Current Functional Level Cognition  Overall Cognitive Status: Within Functional Limits for tasks assessed Orientation Level: Oriented X4 General Comments: tearful regarding lack of relief from pain    Extremity Assessment (includes Sensation/Coordination)  Upper Extremity Assessment: Overall WFL for tasks assessed  Lower Extremity Assessment: Defer to  PT evaluation RLE Deficits / Details: RLE buckling, requiring stabilization during session    ADLs  Overall ADL's : Needs assistance/impaired Grooming: Oral care, Wash/dry face, Wash/dry hands, Set up, Sitting Grooming Details (indicate cue type and reason): unable to maintain standing for grooming tasks this session Upper Body Bathing: Supervision/ safety, Set up, Sitting Lower Body Bathing: Sit to/from stand, Cueing for back precautions, Cueing for compensatory techniques, Cueing for safety, Moderate assistance Lower Body Bathing Details (indicate cue type and reason): assist for B feet and buttocks, min assist in standing  Upper Body Dressing : Set up, Sitting Lower Body Dressing: Maximal assistance, Cueing for compensatory techniques, Cueing for safety, Cueing for back precautions, Sit to/from stand Lower Body Dressing Details (indicate  cue type and reason): requires total assist for B shoes, cueing to adhere to back precautions; min assist in standing  Toilet Transfer: Moderate assistance, Ambulation, BSC, Regular Toilet, RW(3:1 over toilet ) Toilet Transfer Details (indicate cue type and reason): requires mod assist to ascend from commode and maintain balance during transition Toileting- Clothing Manipulation and Hygiene: Moderate assistance, Sit to/from stand, Cueing for compensatory techniques, Cueing for back precautions Toileting - Clothing Manipulation Details (indicate cue type and reason): Pt educated in use of toilet aide to maximize independence Functional mobility during ADLs: Minimal assistance, Rolling walker(grossly min assist, several LOB with mod-max A to recover ) General ADL Comments: Pt continues to be highly motivated to return to independent. performed sit <>stand multiple times with mod A but currently the RLE requires blocking out to maintain standing - did not attempt further mobility this session    Mobility  Overal bed mobility: Needs Assistance Bed Mobility: Rolling, Sidelying to Sit, Sit to Sidelying Rolling: Supervision Sidelying to sit: Min assist Sit to sidelying: Min assist General bed mobility comments: Pt OOB in recliner at beginning and end of session    Transfers  Overall transfer level: Needs assistance Equipment used: Rolling walker (2 wheeled) Transfers: Sit to/from Stand Sit to Stand: Min assist, Max assist, Mod assist, +2 safety/equipment(assist levels varried.  ) General transfer comment: min assistance to power up unsteadiness in B LEs but no true buckling.  Pt able to follow commands after short gt trial to return to seated position.  Stood one more time to see if BP was decreasing with position changes and she required moderated assistance to stand and max assistance to sit with LOB from dizziness.      Ambulation / Gait / Stairs / Wheelchair Mobility   Ambulation/Gait Ambulation/Gait assistance: Mod assist, +2 safety/equipment Gait Distance (Feet): 8 Feet Assistive device: Rolling walker (2 wheeled) Gait Pattern/deviations: Step-to pattern, Decreased stride length General Gait Details: Cues for sequencing and use of UEs in stance phase to avoid buckling.  Pt limited due to complaints of dizziness and require close chair follow from behind.   Gait velocity: decreased Gait velocity interpretation: <1.31 ft/sec, indicative of household ambulator    Posture / Balance Balance Overall balance assessment: Needs assistance Sitting-balance support: No upper extremity supported, Feet supported Sitting balance-Leahy Scale: Good Standing balance support: Bilateral upper extremity supported Standing balance-Leahy Scale: Poor Standing balance comment: heavy reliance on BUE, strong LOB in standing likely due to dizziness but required max assistance to recover.      Special needs/care consideration BiPAP/CPAP: No CPM: No Continuous Drip IV: No Dialysis: No         Life Vest: No Oxygen: No Special Bed: No Trach Size: Mp Wound Vac (area): No  Skin: WDL                               Bowel mgmt: 07/18/18 Bladder mgmt: Continent  Diabetic mgmt: No     Previous Home Environment Living Arrangements: Spouse/significant other Available Help at Discharge: Family, Available 24 hours/day Type of Home: House Home Layout: Able to live on main level with bedroom/bathroom, One level Home Access: Stairs to enter Entrance Stairs-Rails: Right, Left, Can reach both Entrance Stairs-Number of Steps: 4 Bathroom Shower/Tub: Multimedia programmer: Programmer, systems: Yes Home Care Services: No  Discharge Living Setting Plans for Discharge Living Setting: Patient's home, Lives with (comment)(Spouse and son ) Type of Home at Discharge: House Discharge Home Layout: Two level, Able to live on main level with  bedroom/bathroom Discharge Home Access: Stairs to enter Entrance Stairs-Rails: Right, Left, Can reach both Entrance Stairs-Number of Steps: 4 Discharge Bathroom Shower/Tub: Walk-in shower, Door Discharge Bathroom Toilet: Standard Discharge Bathroom Accessibility: Yes How Accessible: Accessible via walker Does the patient have any problems obtaining your medications?: No  Social/Family/Support Systems Patient Roles: Spouse, Parent Contact Information: Spouse: Administrator, sports Anticipated Caregiver: Son PRN days and Spouse PRN nights and weekends Anticipated Caregiver's Contact Information: see above  Ability/Limitations of Caregiver: Spouse works for [employer] and sons work Engineer, civil (consulting) from home Caregiver Availability: Intermittent Discharge Plan Discussed with Primary Caregiver: (Discussed with patient ) Is Caregiver In Agreement with Plan?: (Patient in agreement with plan ) Does Caregiver/Family have Issues with Lodging/Transportation while Pt is in Rehab?: No  Goals/Additional Needs Patient/Family Goal for Rehab: PT/OT: Mod I  Expected length of stay: ~7 days  Cultural Considerations: [employer] Dietary Needs: Regular textures and thin liquids Equipment Needs: TBD Special Service Needs: Workers Comp Case Additional Information: Patient injured while here at work at [employer] to Admission and willing to participate: Yes Program Orientation Provided & Reviewed with Pt/Caregiver Including Roles  & Responsibilities: Yes Additional Information Needs: Patty: RN CM with [facility]; Tyson Alias: adjuster with [employer]   Information Needs to be Provided By: Team FYI  Barriers to Discharge: Decreased caregiver support, Home environment access/layout(needs to be able to do steps into home and be Mod I on main )  Decrease burden of Care through IP rehab admission: No  Possible need for SNF placement upon discharge: No  Patient Condition: This patient's medical and functional status has changed  since the consult dated: 07/16/18 in which the Rehabilitation Physician determined and documented that the patient's condition is appropriate for intensive rehabilitative care in an inpatient rehabilitation facility. See "History of Present Illness" (above) for medical update. Functional changes are: Min A-Mod A+2-Max A with transfers; Mod A +2 8 feet with rolling walker. Patient's medical and functional status update has been discussed with the Rehabilitation physician and patient remains appropriate for inpatient rehabilitation. Will admit to inpatient rehab today.  Preadmission Screen Completed By:  Gunnar Fusi, 07/20/2018 3:10 PM ______________________________________________________________________   Discussed status with Dr. Naaman Plummer on 07/20/18 at 1510 and received telephone approval for admission today.  Admission Coordinator:  Gunnar Fusi, time 1510/Date 07/20/18

## 2018-07-21 ENCOUNTER — Inpatient Hospital Stay (HOSPITAL_COMMUNITY): Payer: Self-pay | Admitting: Physical Therapy

## 2018-07-21 ENCOUNTER — Encounter (HOSPITAL_COMMUNITY): Payer: Self-pay | Admitting: Interventional Radiology

## 2018-07-21 ENCOUNTER — Encounter: Payer: 59 | Admitting: Physical Therapy

## 2018-07-21 ENCOUNTER — Inpatient Hospital Stay (HOSPITAL_COMMUNITY): Payer: 59

## 2018-07-21 ENCOUNTER — Inpatient Hospital Stay (HOSPITAL_COMMUNITY): Payer: Self-pay | Admitting: Occupational Therapy

## 2018-07-21 HISTORY — PX: IR EPIDUROGRAPHY: IMG2365

## 2018-07-21 LAB — CBC WITH DIFFERENTIAL/PLATELET
BASOS ABS: 0.1 10*3/uL (ref 0.0–0.1)
Basophils Relative: 1 %
EOS ABS: 0.1 10*3/uL (ref 0.0–0.7)
Eosinophils Relative: 1 %
HEMATOCRIT: 36.7 % (ref 36.0–46.0)
HEMOGLOBIN: 11.8 g/dL — AB (ref 12.0–15.0)
LYMPHS ABS: 3.3 10*3/uL (ref 0.7–4.0)
Lymphocytes Relative: 45 %
MCH: 29.4 pg (ref 26.0–34.0)
MCHC: 32.2 g/dL (ref 30.0–36.0)
MCV: 91.3 fL (ref 78.0–100.0)
Monocytes Absolute: 0.7 10*3/uL (ref 0.1–1.0)
Monocytes Relative: 9 %
NEUTROS ABS: 3.3 10*3/uL (ref 1.7–7.7)
Neutrophils Relative %: 44 %
Platelets: 241 10*3/uL (ref 150–400)
RBC: 4.02 MIL/uL (ref 3.87–5.11)
RDW: 14.3 % (ref 11.5–15.5)
WBC: 7.5 10*3/uL (ref 4.0–10.5)

## 2018-07-21 LAB — COMPREHENSIVE METABOLIC PANEL
ALBUMIN: 2.8 g/dL — AB (ref 3.5–5.0)
ALK PHOS: 50 U/L (ref 38–126)
ALT: 47 U/L — AB (ref 0–44)
ANION GAP: 7 (ref 5–15)
AST: 25 U/L (ref 15–41)
BILIRUBIN TOTAL: 0.3 mg/dL (ref 0.3–1.2)
BUN: 15 mg/dL (ref 6–20)
CALCIUM: 8.5 mg/dL — AB (ref 8.9–10.3)
CO2: 32 mmol/L (ref 22–32)
CREATININE: 0.94 mg/dL (ref 0.44–1.00)
Chloride: 99 mmol/L (ref 98–111)
GFR calc Af Amer: >60 mL/min (ref >60)
GFR calc non Af Amer: >60 mL/min (ref >60)
GLUCOSE: 85 mg/dL (ref 70–99)
Potassium: 4.5 mmol/L (ref 3.5–5.1)
SODIUM: 138 mmol/L (ref 135–145)
TOTAL PROTEIN: 5.6 g/dL — AB (ref 6.5–8.1)

## 2018-07-21 LAB — PROTIME-INR
INR: 0.99
Prothrombin Time: 13 seconds (ref 11.4–15.2)

## 2018-07-21 MED ORDER — METHYLPREDNISOLONE ACETATE 80 MG/ML IJ SUSP
INTRAMUSCULAR | Status: DC | PRN
  Administered 2018-07-21: 160 mg

## 2018-07-21 MED ORDER — LIDOCAINE HCL 1 % IJ SOLN
INTRAMUSCULAR | Status: DC | PRN
  Administered 2018-07-21: 5 mL

## 2018-07-21 MED ORDER — LIDOCAINE HCL 1 % IJ SOLN
INTRAMUSCULAR | Status: AC
  Filled 2018-07-21: qty 20

## 2018-07-21 MED ORDER — METHYLPREDNISOLONE ACETATE 80 MG/ML IJ SUSP
INTRAMUSCULAR | Status: AC
  Filled 2018-07-21: qty 2

## 2018-07-21 MED ORDER — STERILE WATER FOR INJECTION IJ SOLN
INTRAMUSCULAR | Status: DC | PRN
  Administered 2018-07-21: 5 mL

## 2018-07-21 MED ORDER — IOPAMIDOL (ISOVUE-M 200) INJECTION 41%
INTRAMUSCULAR | Status: AC
  Filled 2018-07-21: qty 10

## 2018-07-21 MED ORDER — LIDOCAINE HCL (PF) 1 % IJ SOLN
INTRAMUSCULAR | Status: AC
  Filled 2018-07-21: qty 30

## 2018-07-21 MED ORDER — SODIUM CHLORIDE 0.9 % IJ SOLN
INTRAMUSCULAR | Status: AC
  Filled 2018-07-21: qty 10

## 2018-07-21 MED ORDER — STERILE WATER FOR INJECTION IJ SOLN
INTRAMUSCULAR | Status: AC
  Filled 2018-07-21: qty 10

## 2018-07-21 MED ORDER — IOPAMIDOL (ISOVUE-M 200) INJECTION 41%
INTRAMUSCULAR | Status: AC
  Administered 2018-07-21: 5 mL
  Filled 2018-07-21: qty 10

## 2018-07-21 MED ORDER — METHYLPREDNISOLONE ACETATE 80 MG/ML IJ SUSP
INTRAMUSCULAR | Status: AC
  Filled 2018-07-21: qty 1

## 2018-07-21 NOTE — Progress Notes (Signed)
Physical Medicine and Rehabilitation Consult Reason for Consult: Decreased functional mobility Referring Physician: [facility]   HPI: [name] is a 57 y.o. right-handed female with history of hypertension, chronic back pain with history of lumbar decompression May 2019 receiving inpatient rehab services for radiculopathy.Lives with spouse.Used a cane PTA and right AFO.  Presented 07/12/2018 by report patient was at work trying to sit on a rolling wheelchair when it slid out from under her and she fell to the floor landing on her buttocks.  Denied loss of consciousness.  MRI lumbar spine showed no fracture or malalignment L2-3 and L4-5 neural foraminal narrowing.  Noted small annular tear at L4-5 with a small central disc protrusion.  CT thoracic spine unremarkable.  Follow-up reviewed by [name] advise conservative care.  Hospital course pain management.  Subcutaneous Lovenox for DVT prophylaxis.  Therapy evaluations completed and ongoing.  MD requested physical medicine rehab consult.   Review of Systems  Constitutional: Negative for chills and fever.  HENT: Negative for hearing loss.   Eyes: Negative for blurred vision and double vision.  Respiratory: Negative for cough and shortness of breath.   Cardiovascular: Negative for chest pain, palpitations and leg swelling.  Gastrointestinal: Positive for constipation. Negative for nausea and vomiting.  Genitourinary: Negative for dysuria, flank pain and hematuria.  Musculoskeletal: Positive for back pain and myalgias.  Skin: Negative for rash.  Neurological: Positive for headaches.  Psychiatric/Behavioral:       Anxiety  All other systems reviewed and are negative.      Past Medical History:  Diagnosis Date  . Anxiety   . Back pain    related to spinal stenosis and disc problem, radiates down left buttocks to leg., weakness occ.  [name] Dyspnea   . Headache   . Hyperlipidemia   . Hypertension   . PONV  (postoperative nausea and vomiting)   . Vaginal foreign object    "Uses Femring"        Past Surgical History:  Procedure Laterality Date  . ABDOMINAL HYSTERECTOMY    . CARDIAC CATHETERIZATION N/A 04/18/2015   Procedure: Left Heart Cath and Coronary Angiography;  Surgeon: [name], MD;  Location: [facility];  Service: Cardiovascular;  Laterality: N/A;  . FOOT SURGERY Bilateral    [place] "bunion,bone spur, tendon" (1) -6'16, (1)-10'16  . LUMBAR LAMINECTOMY/DECOMPRESSION MICRODISCECTOMY Bilateral 12/28/2015   Procedure: MICRO LUMBAR DECOMPRESSION L4 - L5 BILATERALLY;  Surgeon: [name], MD;  Location: [facility];  Service: Orthopedics;  Laterality: Bilateral;  . LUMBAR LAMINECTOMY/DECOMPRESSION MICRODISCECTOMY Bilateral 03/04/2018   Procedure: Revision of Microlumbar Decompression Bilateral Lumbar Four-Five;  Surgeon: [name], MD;  Location: [facility];  Service: Orthopedics;  Laterality: Bilateral;  90 mins  . WISDOM TOOTH EXTRACTION    . WOUND EXPLORATION N/A 03/04/2018   Procedure: EXPLORATION OF LUMBAR DECOMPRESSION WOUND;  Surgeon: [name], MD;  Location: [facility];  Service: Orthopedics;  Laterality: N/A;        Family History  Problem Relation Age of Onset  . Heart attack Mother   . Lung cancer Father   . Cancer Father   . Pancreatic cancer Sister   . Breast cancer Sister 56  . Throat cancer Brother   . Multiple myeloma Sister   . Breast cancer Sister        diagnosed in her 63's  . Heart attack Sister   . Stomach cancer Cousin   . Colon cancer Neg Hx  Social History:  reports that she has never smoked. She has never used smokeless tobacco. She reports that she does not drink alcohol or use drugs. Allergies:       Allergies  Allergen Reactions  . Cephalosporins Anaphylaxis  . Penicillins Anaphylaxis and Hives    Has patient had a PCN reaction causing immediate rash, facial/tongue/throat swelling, SOB or  lightheadedness with hypotension: Yes Has patient had a PCN reaction causing severe rash involving mucus membranes or skin necrosis: Yes Has patient had a PCN reaction that required hospitalization Yes Has patient had a PCN reaction occurring within the last 10 years: No If all of the above answers are "NO", then may proceed with Cephalosporin use.   . Anesthetics, Amide Nausea And Vomiting    Does not know name of it. States they put it on record foot center.   [name] Peach [Prunus Persica] Hives  . Latex Hives, Itching and Rash         Medications Prior to Admission  Medication Sig Dispense Refill  . acetaminophen (TYLENOL) 325 MG tablet Take 1-2 tablets (325-650 mg total) by mouth every 4 (four) hours as needed for mild pain.    [name] acidophilus (RISAQUAD) CAPS capsule Take 1 capsule by mouth daily.    [name] ALPRAZolam (XANAX) 0.25 MG tablet Take 1 tablet (0.25 mg total) by mouth daily as needed for anxiety or sleep. 5 tablet 0  . atorvastatin (LIPITOR) 20 MG tablet Take 1 tablet (20 mg total) by mouth daily. 90 tablet 3  . carvedilol (COREG) 6.25 MG tablet TAKE 1 TABLET BY MOUTH 2 TIMES DAILY. (Patient taking differently: Take 6.25 mg by mouth 2 (two) times daily with a meal. ) 180 tablet 1  . docusate sodium (COLACE) 100 MG capsule Take 1 capsule (100 mg total) by mouth 2 (two) times daily. (Patient taking differently: Take 100 mg by mouth 2 (two) times daily as needed for mild constipation. ) 60 capsule 1  . EPINEPHrine 0.3 mg/0.3 mL IJ SOAJ injection Inject 0.3 mg into the muscle once. For severe allergic reaction    . gabapentin (NEURONTIN) 300 MG capsule Take 1 capsule (300 mg total) by mouth 3 (three) times daily. 90 capsule 4  . LORazepam (ATIVAN) 0.5 MG tablet Take 0.5-1 tablets (0.25-0.5 mg total) by mouth daily as needed for anxiety. 30 tablet 1  . methocarbamol (ROBAXIN) 500 MG tablet Take 1 tablet (500 mg total) by mouth every 6 (six) hours as needed for muscle spasms. 120 tablet  2  . polyethylene glycol (MIRALAX / GLYCOLAX) packet Take 17 g by mouth daily. 30 each 0  . PREMARIN 0.625 MG tablet Take 0.625 mg by mouth daily.  7  . traMADol (ULTRAM) 50 MG tablet Take 1 tablet (50 mg total) by mouth every 6 (six) hours as needed for moderate pain. 21 tablet 0    Home: Home Living Family/patient expects to be discharged to:: Private residence Living Arrangements: Spouse/significant other Available Help at Discharge: Family, Available 24 hours/day Type of Home: House Home Access: Stairs to enter [employer] of Steps: 4 Entrance Stairs-Rails: Right, Left, Can reach both Home Layout: Able to live on main level with bedroom/bathroom, One level Bathroom Shower/Tub: Multimedia programmer: Standard Home Equipment: Civil engineer, contracting, Radio producer - single point, Environmental consultant - 2 wheels  Functional History: Prior Function Level of Independence: Independent with assistive device(s) Comments: ambulated with RW secondary to R foot drop/numbness Functional Status:  Mobility: Bed Mobility Overal bed mobility: Needs Assistance Bed  Mobility: Rolling, Sidelying to Sit Rolling: Min guard Sidelying to sit: Min assist General bed mobility comments: Min assist to elevate bil LE secondary to pain in back.  Pt continues to require min cues for log roll sequence to prevent twisting. Transfers Overall transfer level: Needs assistance Equipment used: Rolling walker (2 wheeled) Transfers: Sit to/from Stand Sit to Stand: Min assist, Mod assist, Min guard General transfer comment: Min a - min g required for transfers upon standing secondary to reports of feeling "woozy" and lightheaded with LOBx1 requiring mod assistance to recover. VCs required for pt to keep her eyes open upon standing with onset of dizziness. Pt requiring cues for safe hand placement as pt attempted to pull to stand with RW.  Ambulation/Gait Ambulation/Gait assistance: Min guard Gait Distance (Feet): 35  Feet Assistive device: Rolling walker (2 wheeled) Gait Pattern/deviations: Step-to pattern, Decreased step length - right, Decreased dorsiflexion - right General Gait Details: Pt will slow gait throughout session. MAFO donned prior to entry on R foot. Continues to require cues to increase knee flexion in order to prevent dragging R foot.  Gait velocity: decreased Gait velocity interpretation: <1.8 ft/sec, indicate of risk for recurrent falls  ADL:  Cognition: Cognition Overall Cognitive Status: Within Functional Limits for tasks assessed Orientation Level: Oriented X4 Cognition Arousal/Alertness: Awake/alert Behavior During Therapy: WFL for tasks assessed/performed Overall Cognitive Status: Within Functional Limits for tasks assessed  Blood pressure (!) 163/94, pulse 63, temperature 98 F (36.7 C), temperature source Oral, resp. rate 18, height _0  (1.651 m), weight 66.7 kg, SpO2 100 %. Physical Exam  Constitutional: She appears well-developed and well-nourished.  HENT:  Head: Normocephalic and atraumatic.  Eyes: EOM are normal.  Neck: Normal range of motion. No thyromegaly present.  Cardiovascular: Normal rate and regular rhythm.  Respiratory: Effort normal and breath sounds normal. No respiratory distress. She has no wheezes.  GI: Soft. She exhibits no distension.  Musculoskeletal:  Low back TTP. Side lying to left in bed. Both legs without edema. Does have back pain with attempts at PROM of hips and lower legs  Neurological: She is alert.  UE 5/5. LLE 2/5 prox to 1/5 distal with inconstent effort, RLE 2-3/5 prox to 1/5 distally with inconsistent effort. Sensory exam variable in both legs. .  Skin: Skin is warm and dry.  Psychiatric: Judgment and thought content normal.  Anxious but pleasant   Assessment/Plan: Diagnosis: Pt with chronic lumbar radiculopathy and foraminal stenosis bilaterally. Recent fall with exacerbation of pain and motor/sensory deficits in legs. She is  well known to me and struggles with significant anxiety as well.   I have personally reviewed all pertinent radiographical images, tests, and lab work which pertain to the above diagnosis.    1. Does the need for close, 24 hr/day medical supervision in concert with the patient's rehab needs make it unreasonable for this patient to be served in a less intensive setting? No and Potentially 2. Co-Morbidities requiring supervision/potential complications: pain, anxiety 3. Due to bladder management, bowel management, safety, skin/wound care, disease management, medication administration, pain management and patient education, does the patient require 24 hr/day rehab nursing? Potentially 4. Does the patient require coordinated care of a physician, rehab nurse, PT (1-2 hrs/day, 5 days/week) and OT (1-2 hrs/day, 5 days/week) to address physical and functional deficits in the context of the above medical diagnosis(es)? Potentially Addressing deficits in the following areas: balance, endurance, locomotion, strength, transferring, bowel/bladder control, bathing, dressing, feeding, grooming, toileting and psychosocial support 5. Can  the patient actively participate in an intensive therapy program of at least 3 hrs of therapy per day at least 5 days per week? Potentially 6. The potential for patient to make measurable gains while on inpatient rehab is fair 7. Anticipated functional outcomes upon discharge from inpatient rehab are modified independent  with PT, modified independent with OT, n/a with SLP. 8. Estimated rehab length of stay to reach the above functional goals is: TBD 9. Anticipated D/C setting: Home 10. Anticipated post D/C treatments: [place] therapy 11. Overall Rehab/Functional Prognosis: excellent  RECOMMENDATIONS: This patient's condition is appropriate for continued rehabilitative care in the following setting: see below Patient has agreed to participate in recommended program. Yes Note that  insurance prior authorization may be required for reimbursement for recommended care.  Comment: I know [name] very well and have been following her as an outpatient. MRI is not impressive for any acute changes. Suspect she has acute inflammation of her chronic lumbar radiculopathies. Also she struggles with anxiety which often can affect her functionally. Agree with po steroids, treatment of neuropathic pain, use of muscle relaxants, etc. Need to mobilize her too over the weekend. I would expect that with this treatment and some positive reinforcement, that she should be able to return home. If she is still struggling Monday, we could consider a rehab admission.   I have personally performed a face to face diagnostic evaluation of this patient and formulated the key components of the plan.  Additionally, I have personally reviewed laboratory data, imaging studies, as well as relevant notes and concur with the physician assistant's documentation above.  Meredith Staggers, MD, Mellody Drown  07/16/2018   Lavon Paganini Angiulli, PA-C 07/16/2018        Revision History                        Routing History

## 2018-07-21 NOTE — Evaluation (Signed)
Occupational Therapy Assessment and Plan  Patient Details  Name: [name] MRN: [id number] Date of Birth: 1961-09-17  OT Diagnosis: abnormal posture, acute pain and pain in lower back, and general weakness Rehab Potential: Rehab Potential (ACUTE ONLY): Good ELOS: 7-10 days   Today's Date: 07/21/2018 OT Individual Time: 7106-2694 OT Individual Time Calculation (min): 75 min     Problem List:  Patient Active Problem List   Diagnosis Date Noted  . Gait disorder   . Difficulty in urination 07/13/2018  . Back pain 07/12/2018  . Hyperlipidemia 04/16/2018  . Generalized anxiety disorder with panic attacks 04/16/2018  . AKI (acute kidney injury) ([name])   . Benign essential HTN   . Depression with anxiety   . Lumbar radiculopathy 03/09/2018  . Paraparesis ([place]) 03/09/2018  . Essential hypertension   . Chronic nonintractable headache   . Leukocytosis   . Acute blood loss anemia   . Postoperative pain   . Generalized weakness   . Spinal stenosis at L4-L5 level 03/04/2018  . Spinal stenosis of lumbar region 12/28/2015  . Chest pain 04/16/2015    Past Medical History:  Past Medical History:  Diagnosis Date  . Anxiety   . Back pain    related to spinal stenosis and disc problem, radiates down left buttocks to leg., weakness occ.  [name] Dyspnea   . Headache   . Hyperlipidemia   . Hypertension   . PONV (postoperative nausea and vomiting)   . Vaginal foreign object    "Uses Femring"   Past Surgical History:  Past Surgical History:  Procedure Laterality Date  . ABDOMINAL HYSTERECTOMY    . CARDIAC CATHETERIZATION N/A 04/18/2015   Procedure: Left Heart Cath and Coronary Angiography;  Surgeon: [name], MD;  Location: [name];  Service: Cardiovascular;  Laterality: N/A;  . FOOT SURGERY Bilateral    [place] "bunion,bone spur, tendon" (1) -6'16, (1)-10'16  . LUMBAR LAMINECTOMY/DECOMPRESSION MICRODISCECTOMY Bilateral 12/28/2015   Procedure: MICRO LUMBAR  DECOMPRESSION L4 - L5 BILATERALLY;  Surgeon: [name], MD;  Location: [facility];  Service: Orthopedics;  Laterality: Bilateral;  . LUMBAR LAMINECTOMY/DECOMPRESSION MICRODISCECTOMY Bilateral 03/04/2018   Procedure: Revision of Microlumbar Decompression Bilateral Lumbar Four-Five;  Surgeon: [name], MD;  Location: [facility];  Service: Orthopedics;  Laterality: Bilateral;  90 mins  . WISDOM TOOTH EXTRACTION    . WOUND EXPLORATION N/A 03/04/2018   Procedure: EXPLORATION OF LUMBAR DECOMPRESSION WOUND;  Surgeon: [name], MD;  Location: [facility];  Service: Orthopedics;  Laterality: N/A;    Assessment & Plan Clinical Impression: Patient is a 57 y.o. year old female with history of hypertension, chronic back pain with history of lumbar decompression May 2019 by [name] receiving inpatient rehab services for radiculopathy. She lives with spouse. Used a cane prior to admission and a right AFO. Presented 07/12/2018 by report patient was back at work part-time sitting on a rolling wheelchair when she slid out from under her and she fell to the floor landing on her buttocks. Denied loss of consciousness. MRI lumbar spine showed no fracture malalignment L2-3 and L4-5 neural foraminal narrowing. Noted small annular tear at L4-5 with a small central disc protrusion. CT thoracic spine unremarkable. Follow-up orthopedic services Dr. [name] conservative care. Plan forESI L4-5 due to ongoing pain perinterventional radiology. Hospital course ongoing pain management. . Patient transferred to CIR on 07/20/2018 .    Patient currently requires mod with basic self-care skills and IADL secondary to muscle weakness, decreased cardiorespiratoy endurance  and decreased sitting balance, decreased standing balance, decreased balance strategies and pain management.  Prior to hospitalization, patient could complete ADLs and IADLS with modified independent .  Patient will benefit from skilled intervention to increase  independence with basic self-care skills and increase level of independence with iADL prior to discharge home with care partner.  Anticipate patient will require intermittent supervision and follow up outpatient.  OT - End of Session Activity Tolerance: Decreased this session Endurance Deficit: Yes Endurance Deficit Description: limited by pain OT Assessment Rehab Potential (ACUTE ONLY): Good OT Barriers to Discharge: Other (comments) OT Barriers to Discharge Comments: none known at this time OT Patient demonstrates impairments in the following area(s): Balance;Endurance;Motor;Pain;Safety OT Basic ADL's Functional Problem(s): Bathing;Grooming;Dressing;Toileting OT Transfers Functional Problem(s): Toilet;Tub/Shower OT Additional Impairment(s): None OT Plan OT Intensity: Minimum of 1-2 x/day, 45 to 90 minutes OT Frequency: 5 out of 7 days OT Duration/Estimated Length of Stay: 7-10 days OT Treatment/Interventions: Balance/vestibular training;Self Care/advanced ADL retraining;Therapeutic Exercise;DME/adaptive equipment instruction;Pain management;Community reintegration;Patient/family education;UE/LE Coordination activities;Discharge planning;Functional mobility training;Psychosocial support;Therapeutic Activities OT Self Feeding Anticipated Outcome(s): n/a OT Basic Self-Care Anticipated Outcome(s): mod I  OT Toileting Anticipated Outcome(s): mod I  OT Bathroom Transfers Anticipated Outcome(s): mod I - toilet and supervision - shower OT Recommendation Recommendations for Other Services: Neuropsych consult Patient destination: Home Follow Up Recommendations: Outpatient OT Equipment Recommended: None recommended by OT   Skilled Therapeutic Intervention Upon entering the room, pt supine in bed awaiting OT arrival. Pt with c/o 8/10 pain in lower back and reports receiving medication prior to OT arrival. OT educated pt on OT purpose, POC, and goals with pt verbalizing understanding and  agreement. Pt standing from bed with mod A and ambulating 10' into bathroom with RW and min A for balance. Pt seated on TTB for bathing at shower level. Mod A for bathing secondary to increased pain and min verbal cues to maintain precautions during routine self care tasks. Pt transferred onto toilet with min A and able to void. Min A standing balance for hygiene and clothing management. Pt ambulating back to sit on EOB for dressing tasks. Figure four position to thread pants onto LEs but continuing to need more assist secondary to pain. Pt standing at sink for grooming tasks with min A for standing balance. Pt returning to supine in bed for pain management. Call bell and all needed items within reach upon exiting the room.   OT Evaluation Precautions/Restrictions  Precautions Precautions: Back Precaution Comments: reviewed 3/3 back precautions Required Braces or Orthoses: Spinal Brace Spinal Brace: Lumbar corset Spinal Brace Comments: lumbar brace from home, applied in sitting Restrictions Weight Bearing Restrictions: No General PT Missed Treatment Reason: Pain(9/10 pain) Vital Signs  Pain Pain Assessment Pain Scale: 0-10 Pain Score: 8  Pain Type: Acute pain Pain Location: Back Pain Orientation: Lower;Mid Pain Descriptors / Indicators: Aching Pain Frequency: Constant Pain Onset: On-going Patients Stated Pain Goal: 2 Pain Intervention(s): RN made aware;Repositioned Home Living/Prior Functioning Home Living Family/patient expects to be discharged to:: Private residence Living Arrangements: Spouse/significant other Available Help at Discharge: Available PRN/intermittently Type of Home: House Home Access: Stairs to enter Technical brewer of Steps: 5 Entrance Stairs-Rails: Can reach both Home Layout: One level Bathroom Shower/Tub: Multimedia programmer: Standard  Lives With: Spouse Prior Function Level of Independence: Requires assistive device for independence   Able to Take Stairs?: Yes Driving: No Vocation: Part time employment Vocation Requirements: had just returned to work 1 mo prior to fall, not driving 2/2 residual  deficits in RLE from previous back surgery Comments: ambulated with RW secondary to R foot drop/numbness, independent ADLs  Vision Baseline Vision/History: Wears glasses Wears Glasses: Reading only Patient Visual Report: No change from baseline Vision Assessment?: No apparent visual deficits Perception  Perception: Within Functional Limits Praxis Praxis: Intact Cognition Overall Cognitive Status: Within Functional Limits for tasks assessed Arousal/Alertness: Awake/alert Orientation Level: Person;Place;Situation Person: Oriented Place: Oriented Situation: Oriented Year: 2019 Month: September Day of Week: Correct Memory: Appears intact Immediate Memory Recall: Sock;Blue;Bed Memory Recall: Sock;Blue;Bed Memory Recall Sock: Without Cue Memory Recall Blue: Without Cue Memory Recall Bed: Without Cue Safety/Judgment: Appears intact Sensation Sensation Light Touch: Impaired by gross assessment Peripheral sensation comments: decreased sensation to light touch below ankle on RLE, baseline since May  Light Touch Impaired Details: Impaired RLE Hot/Cold: Appears Intact Proprioception: Not tested Stereognosis: Not tested Coordination Gross Motor Movements are Fluid and Coordinated: No Fine Motor Movements are Fluid and Coordinated: Yes Motor  Motor Motor: Abnormal postural alignment and control Motor - Skilled Clinical Observations: decreased functional endurance 2/2 increased pain levels Mobility  Bed Mobility Bed Mobility: Rolling Right;Right Sidelying to Sit Rolling Right: Set up assist Right Sidelying to Sit: Set up assist  Trunk/Postural Assessment  Cervical Assessment Cervical Assessment: Within Functional Limits Thoracic Assessment Thoracic Assessment: Within Functional Limits Lumbar Assessment Lumbar  Assessment: Exceptions to [facility] Postural Control Postural Control: Deficits on evaluation Protective Responses: insufficient 2/2 pain Postural Limitations: decreased 2/2 pain  Balance Balance Balance Assessed: Yes Static Sitting Balance Static Sitting - Balance Support: No upper extremity supported;Feet supported Static Sitting - Level of Assistance: 6: Modified independent (Device/Increase time) Dynamic Sitting Balance Dynamic Sitting - Balance Support: Left upper extremity supported;Right upper extremity supported;Feet supported;During functional activity Dynamic Sitting - Level of Assistance: 5: Stand by assistance Static Standing Balance Static Standing - Balance Support: Bilateral upper extremity supported;During functional activity Static Standing - Level of Assistance: 4: Min assist Dynamic Standing Balance Dynamic Standing - Balance Support: Bilateral upper extremity supported;During functional activity Dynamic Standing - Level of Assistance: 3: Mod assist Extremity/Trunk Assessment RUE Assessment RUE Assessment: Within Functional Limits LUE Assessment LUE Assessment: Within Functional Limits   See Function Navigator for Current Functional Status.   Refer to Care Plan for Long Term Goals  Recommendations for other services: Neuropsych   Discharge Criteria: Patient will be discharged from OT if patient refuses treatment 3 consecutive times without medical reason, if treatment goals not met, if there is a change in medical status, if patient makes no progress towards goals or if patient is discharged from hospital.  The above assessment, treatment plan, treatment alternatives and goals were discussed and mutually agreed upon: by patient  [name] 07/21/2018, 11:17 AM

## 2018-07-21 NOTE — Evaluation (Signed)
Physical Therapy Assessment and Plan  Patient Details  Name: [name] MRN: [id number] Date of Birth: November 07, 1960  PT Diagnosis: Abnormality of gait, Difficulty walking, Muscle weakness and Pain in lower back Rehab Potential: Good ELOS: 7-10 days   Today's Date: 07/21/2018 PT Individual Time: 0900-1000 PT Individual Time Calculation (min): 60 min    Problem List:  Patient Active Problem List   Diagnosis Date Noted  . Gait disorder   . Difficulty in urination 07/13/2018  . Back pain 07/12/2018  . Hyperlipidemia 04/16/2018  . Generalized anxiety disorder with panic attacks 04/16/2018  . AKI (acute kidney injury) ([place])   . Benign essential HTN   . Depression with anxiety   . Lumbar radiculopathy 03/09/2018  . Paraparesis ([place]) 03/09/2018  . Essential hypertension   . Chronic nonintractable headache   . Leukocytosis   . Acute blood loss anemia   . Postoperative pain   . Generalized weakness   . Spinal stenosis at L4-L5 level 03/04/2018  . Spinal stenosis of lumbar region 12/28/2015  . Chest pain 04/16/2015    Past Medical History:  Past Medical History:  Diagnosis Date  . Anxiety   . Back pain    related to spinal stenosis and disc problem, radiates down left buttocks to leg., weakness occ.  [name] Dyspnea   . Headache   . Hyperlipidemia   . Hypertension   . PONV (postoperative nausea and vomiting)   . Vaginal foreign object    "Uses Femring"   Past Surgical History:  Past Surgical History:  Procedure Laterality Date  . ABDOMINAL HYSTERECTOMY    . CARDIAC CATHETERIZATION N/A 04/18/2015   Procedure: Left Heart Cath and Coronary Angiography;  Surgeon: [name], MD;  Location: [facility];  Service: Cardiovascular;  Laterality: N/A;  . FOOT SURGERY Bilateral    [place] "bunion,bone spur, tendon" (1) -6'16, (1)-10'16  . LUMBAR LAMINECTOMY/DECOMPRESSION MICRODISCECTOMY Bilateral 12/28/2015   Procedure: MICRO LUMBAR DECOMPRESSION L4 - L5  BILATERALLY;  Surgeon: [name], MD;  Location: [facility];  Service: Orthopedics;  Laterality: Bilateral;  . LUMBAR LAMINECTOMY/DECOMPRESSION MICRODISCECTOMY Bilateral 03/04/2018   Procedure: Revision of Microlumbar Decompression Bilateral Lumbar Four-Five;  Surgeon: [name], MD;  Location: [facility];  Service: Orthopedics;  Laterality: Bilateral;  90 mins  . WISDOM TOOTH EXTRACTION    . WOUND EXPLORATION N/A 03/04/2018   Procedure: EXPLORATION OF LUMBAR DECOMPRESSION WOUND;  Surgeon: [name], MD;  Location: [facility];  Service: Orthopedics;  Laterality: N/A;    Assessment & Plan Clinical Impression: Cheryl Carney. [name] is a 57 year old right-handed female history of hypertension, chronic back pain with history of lumbar decompression May 2019 by [name] received inpatient rehab services for radiculopathy.  She lives with spouse.  Used a rolling walker prior to admission and a right AFO.  Presented 07/12/2018 by report patient was back at work part-time sitting on a rolling wheelchair when she slid out from under her and she fell to the floor landing on her buttocks.  Denied loss of consciousness.  MRI lumbar spine showed no fracture malalignment L2-3 and L4-5 neural foraminal narrowing.  Noted small annular tear at L4-5 with a small central disc protrusion.  CT thoracic spine unremarkable.  Follow-up orthopedic services [name] with conservative care.  Plan for ESI L4-5 due to ongoing pain per interventional radiology.  Hospital course ongoing pain management.  Therapy evaluations completed with recommendations of physical medicine rehab consult.  Patient was admitted for a comprehensive rehab program  07/20/18.   Patient transferred to CIR on 07/20/2018 .   Patient currently requires min to mod assist with mobility secondary to muscle weakness, unbalanced muscle activation and decreased coordination and decreased standing balance, decreased postural control and decreased balance strategies.  Prior  to hospitalization, patient was modified independent  with mobility and lived with Spouse in a House home.  Home access is 5Stairs to enter.  Patient will benefit from skilled PT intervention to maximize safe functional mobility, minimize fall risk and decrease caregiver burden for planned discharge home with intermittent assist.  Anticipate patient will benefit from f/u with PT on a home or outpatient basis at discharge.  PT - End of Session Activity Tolerance: Tolerates 10 - 20 min activity with multiple rests Endurance Deficit: Yes Endurance Deficit Description: limited by pain PT Assessment Rehab Potential (ACUTE/IP ONLY): Good PT Barriers to Discharge: Decreased caregiver support PT Patient demonstrates impairments in the following area(s): Balance;Endurance;Motor;Pain PT Transfers Functional Problem(s): Bed Mobility;Bed to Chair;Car;Furniture;Floor PT Locomotion Functional Problem(s): Stairs;Wheelchair Mobility;Ambulation PT Plan PT Intensity: Minimum of 1-2 x/day ,45 to 90 minutes PT Frequency: 5 out of 7 days PT Duration Estimated Length of Stay: 7-10 days PT Treatment/Interventions: Ambulation/gait training;DME/adaptive equipment instruction;Neuromuscular re-education;Psychosocial support;UE/LE Strength taining/ROM;Wheelchair propulsion/positioning;Stair training;Balance/vestibular training;Discharge planning;Pain management;Therapeutic Activities;UE/LE Coordination activities;Functional mobility training;Patient/family education;Splinting/orthotics;Therapeutic Exercise PT Transfers Anticipated Outcome(s): mod I PT Locomotion Anticipated Outcome(s): mod I ambulatory with LRAD PT Recommendation Follow Up Recommendations: Home health PT Patient destination: Home Equipment Recommended: None recommended by PT Equipment Details: has SPC, RW, and w/c at home  Skilled Therapeutic Intervention Pt with pain as described below, monitored throughout session and applied heat. RN aware.  PT  instructed pt in initial PT evaluation, POC, and rehab goals.  Pt requiring min to mod assist for sit<>stand and stand/pivot transfers with RW dependent upon fatigue and pain.  Short distance gait with RW and up to mod assist, LLE with mild buckling when fatigued.  Car transfer with mod for stand/pivot and min to lift RLE into car.  Pt returned to room at end of session, positioned in bed with mod assist.  Missed 15 minutes 2/2 9/10 pain by end of session.    PT Evaluation Precautions/Restrictions Precautions Precautions: Back Precaution Comments: reviewed 3/3 back precautions Required Braces or Orthoses: Spinal Brace Spinal Brace: Lumbar corset Spinal Brace Comments: lumbar brace from home, applied in sitting Restrictions Weight Bearing Restrictions: No General PT Amount of Missed Time (min): 15 Minutes PT Missed Treatment Reason: Pain(9/10 pain)  Pain Pain Assessment Pain Scale: 0-10 Pain Score: 8  Pain Type: Acute pain Pain Location: Back Pain Descriptors / Indicators: Aching Pain Frequency: Constant Pain Onset: On-going Patients Stated Pain Goal: 2 Pain Intervention(s): Heat applied Home Living/Prior Functioning Home Living Available Help at Discharge: Available PRN/intermittently Type of Home: House Home Access: Stairs to enter Technical brewer of Steps: 5 Entrance Stairs-Rails: Can reach both Home Layout: One level  Lives With: Spouse Prior Function Level of Independence: Requires assistive device for independence  Able to Take Stairs?: Yes Driving: No Vocation: Part time employment Vocation Requirements: had just returned to work 1 mo prior to fall, not driving 2/2 residual deficits in RLE from previous back surgery Vision/Perception  Perception Perception: Within Functional Limits Praxis Praxis: Intact  Cognition Overall Cognitive Status: Within Functional Limits for tasks assessed Arousal/Alertness: Awake/alert Orientation Level: Oriented  X4 Safety/Judgment: Appears intact Sensation Sensation Light Touch: Impaired Detail Peripheral sensation comments: decreased sensation to light touch below ankle on RLE, baseline since  May  Light Touch Impaired Details: Impaired RLE Coordination Gross Motor Movements are Fluid and Coordinated: No Fine Motor Movements are Fluid and Coordinated: Yes Motor  Motor Motor: Abnormal postural alignment and control Motor - Skilled Clinical Observations: decreased functional endurance 2/2 increased pain levels  Mobility Bed Mobility Bed Mobility: Rolling Right;Right Sidelying to Sit Rolling Right: Set up assist Right Sidelying to Sit: Set up assist Transfers Transfers: Stand Pivot Transfers Stand Pivot Transfers: Moderate Assistance - Patient 50 - 74% Stand Pivot Transfer Details: Manual facilitation for weight shifting Stand Pivot Transfer Details (indicate cue type and reason): needs boosting assist 2/2 pain Transfer (Assistive device): Rolling walker Locomotion  Gait Ambulation: Yes Gait Assistance: Moderate Assistance - Patient 50-74%;2 Helpers(w/c follow) Gait Distance (Feet): 10 Feet Assistive device: Rolling walker Gait Gait: Yes Gait Pattern: Impaired Gait Pattern: Step-to pattern;Decreased hip/knee flexion - right;Decreased dorsiflexion - right Stairs / Additional Locomotion Stairs: No Wheelchair Mobility Wheelchair Mobility: Yes Wheelchair Assistance: Chartered loss adjuster: Both upper extremities Wheelchair Parts Management: Needs assistance Distance: 150  Trunk/Postural Assessment  Cervical Assessment Cervical Assessment: Within Functional Limits Thoracic Assessment Thoracic Assessment: Within Functional Limits Lumbar Assessment Lumbar Assessment: Exceptions to WFL(lumbar corset) Postural Control Postural Control: Deficits on evaluation Protective Responses: insufficient 2/2 pain Postural Limitations: decreased 2/2 pain   Balance Balance Balance Assessed: Yes Static Sitting Balance Static Sitting - Balance Support: No upper extremity supported;Feet supported Static Sitting - Level of Assistance: 6: Modified independent (Device/Increase time) Dynamic Sitting Balance Dynamic Sitting - Balance Support: Left upper extremity supported;Right upper extremity supported;Feet supported;During functional activity Dynamic Sitting - Level of Assistance: 5: Stand by assistance Static Standing Balance Static Standing - Balance Support: Bilateral upper extremity supported;During functional activity Static Standing - Level of Assistance: 4: Min assist Dynamic Standing Balance Dynamic Standing - Balance Support: Bilateral upper extremity supported;During functional activity Dynamic Standing - Level of Assistance: 3: Mod assist Extremity Assessment      RLE Assessment Passive Range of Motion (PROM) Comments: WFL Active Range of Motion (AROM) Comments: decreased at hip 2/2 weakness General Strength Comments: unable to formally assess 2/2 back pain, grossly 3+/5 proximally, foot drop LLE Assessment Passive Range of Motion (PROM) Comments: WFL Active Range of Motion (AROM) Comments: WFL assessed in sitting General Strength Comments: unable to formally assess 2/2 back pain, grossly 3+/5 proximal to distal   See Function Navigator for Current Functional Status.   Refer to Care Plan for Long Term Goals  Recommendations for other services: None   Discharge Criteria: Patient will be discharged from PT if patient refuses treatment 3 consecutive times without medical reason, if treatment goals not met, if there is a change in medical status, if patient makes no progress towards goals or if patient is discharged from hospital.  The above assessment, treatment plan, treatment alternatives and goals were discussed and mutually agreed upon: by patient  Michel Santee 07/21/2018, 10:21 AM

## 2018-07-21 NOTE — Progress Notes (Signed)
PMR Admission Coordinator Pre-Admission Assessment  Patient: [name] is an 57 y.o., female MRN: [id number] DOB: 1961-01-14 Height: _0  (165.1 cm) Weight: 66.7 kg                                                                                                                                                  Insurance Information PRIMARY: Worker's Compensation       Policy#: [id number]      Subscriber: Patient  Adjuster Name: [name]      Phone#: [phone]      Fax#: [phone] CM Name: [name]      Phone#: [phone], likes updates via phone       Pre-Cert#: [id number] verbal approval given by [name] via voicemail       Employer: [employer]: Per Worker's Comp        Emergency Contact Information         Contact Information    Name Relation Home Work Mobile   [place] Spouse [phone]  [phone]   Cheryl, [name]   [phone]     Current Medical History  Patient Admitting Diagnosis: Pt with chronic lumbar radiculopathy and foraminal stenosis bilaterally. Recent fall with exacerbation of pain and motor/sensory deficits in legs. She is well known to me and struggles with significant anxiety as well.  History of Present Illness: [name] is a 57 year old right-handed female history of hypertension, chronic back pain with history of lumbar decompression May 2019 by [name] received inpatient rehab services for radiculopathy. She lives with spouse. Used a rolling walker prior to admission and a right AFO. Presented 07/12/2018 by report patient was back at work part-time sitting on a rolling wheelchair when she slid out from under her and she fell to the floor landing on her buttocks. Denied loss of consciousness. MRI lumbar spine showed no fracture malalignment L2-3 and L4-5 neural foraminal narrowing. Noted small annular tear at L4-5 with a small central disc protrusion. CT thoracic spine unremarkable.  Follow-up orthopedic services [name] conservative care. Plan forESI L4-5 due to ongoing pain perinterventional radiology. Hospital course ongoing pain management. Therapy evaluations completed with recommendations of physical medicine rehab consult. Patient was admitted for a comprehensive rehab program 07/20/18.  Past Medical History      Past Medical History:  Diagnosis Date  . Anxiety   . Back pain    related to spinal stenosis and disc problem, radiates down left buttocks to leg., weakness occ.  [name] Dyspnea   . Headache   . Hyperlipidemia   . Hypertension   . PONV (postoperative nausea and vomiting)   . Vaginal foreign object    "Uses Femring"    Family History  family history includes Breast cancer in her sister; Breast cancer (age of onset: 34) in her sister; Cancer in her father;  Heart attack in her mother and sister; Lung cancer in her father; Multiple myeloma in her sister; Pancreatic cancer in her sister; Stomach cancer in her cousin; Throat cancer in her brother.  Prior Rehab/Hospitalizations:  Has the patient had major surgery during 100 days prior to admission? No  Current Medications   Current Facility-Administered Medications:  .  0.9 %  sodium chloride infusion, , Intravenous, PRN, Cheryl Public, [name], Stopped at 07/15/18 0245 .  acetaminophen ([name]) tablet 325-650 mg, 325-650 mg, Oral, Q4H PRN, Cheryl Allan [name], [name] .  atorvastatin (LIPITOR) tablet 20 mg, 20 mg, Oral, Daily, Cheryl Allan [name], [name], 20 mg at 07/20/18 1055 .  carvedilol (COREG) tablet 6.25 mg, 6.25 mg, Oral, BID, Cheryl Allan [name], [name], 6.25 mg at 07/20/18 1055 .  estrogens (conjugated) (PREMARIN) tablet 0.625 mg, 0.625 mg, Oral, Daily, Cheryl Allan [name], [name], 0.625 mg at 07/20/18 1103 .  gabapentin (NEURONTIN) capsule 300 mg, 300 mg, Oral, TID, Cheryl Allan [name], [name], 300 mg at 07/20/18 1055 .  Influenza vac split quadrivalent PF (FLUARIX) injection 0.5 mL, 0.5  mL, Intramuscular, Prior to discharge, Cheryl Reasons, [name] .  [name] (ATIVAN) tablet 0.25-0.5 mg, 0.25-0.5 mg, Oral, Daily PRN, Cheryl Allan [name], [name], 0.5 mg at 07/18/18 1620 .  methocarbamol ([name]) tablet 500 mg, 500 mg, Oral, TID, Cheryl Reasons, [name], 500 mg at 07/20/18 1055 .  morphine 2 MG/ML injection 1-2 mg, 1-2 mg, Intravenous, Q4H PRN, Cheryl Allan [name], [name] .  ondansetron (ZOFRAN) injection 4 mg, 4 mg, Intravenous, Q6H PRN, Cheryl Allan [name], [name], 4 mg at 07/20/18 1229 .  oxyCODONE-acetaminophen (PERCOCET/ROXICET) 5-325 MG per tablet 1-2 tablet, 1-2 tablet, Oral, Q4H PRN, Cheryl Allan [name], [name], 2 tablet at 07/20/18 0441 .  polyethylene glycol (MIRALAX / GLYCOLAX) packet 17 g, 17 g, Oral, BID, Cheryl Reasons, [name], 17 g at 07/20/18 1055 .  senna-docusate (Senokot-S) tablet 1 tablet, 1 tablet, Oral, BID, Cheryl Reasons, [name], 1 tablet at 07/20/18 1055 .  traMADol (ULTRAM) tablet 50 mg, 50 mg, Oral, Q12H, Cheryl Allan [name], [name], 50 mg at 07/20/18 1055  Patients Current Diet:     Diet Order                  Diet - low sodium heart healthy         Diet regular Room service appropriate? Yes; Fluid consistency: Thin  Diet effective now               Precautions / Restrictions Precautions Precautions: Back Precaution Comments: reviewed 3/3 back precautions Spinal Brace: Other (comment) Spinal Brace Comments: other lumbar brace from home Restrictions Weight Bearing Restrictions: No   Has the patient had 2 or more falls or a fall with injury in the past year?Yes the one that lead to this admission.  Prior Activity Level Community (5-7x/wk): Prior to admission patient had returned to work for short days, Mod [name] with use of Rt. AFO and rolling walker.  She was here for IP Rehab in May after her back surgery and has a supportive family that can provide intermittent assistance.    Home Assistive Devices / Equipment Home Assistive Devices/Equipment: Cane (specify quad or straight), Brace  (specify type)(Back Brace ) Home Equipment: Shower seat, Cane - single point, Walker - 2 wheels, Bedside commode  Prior Device Use: Indicate devices/aids used by the patient prior to current illness, exacerbation or injury? Walker and Rt. AFO  Prior Functional Level Prior Function Level of Independence: Independent with assistive  device(s) Comments: ambulated with RW secondary to R foot drop/numbness, independent ADLs   Self Care: Did the patient need help bathing, dressing, using the toilet or eating? Independent  Indoor Mobility: Did the patient need assistance with walking from room to room (with or without device)? Independent  Stairs: Did the patient need assistance with internal or external stairs (with or without device)? Independent  Functional Cognition: Did the patient need help planning regular tasks such as shopping or remembering to take medications? Independent  Current Functional Level Cognition  Overall Cognitive Status: Within Functional Limits for tasks assessed Orientation Level: Oriented X4 General Comments: tearful regarding lack of relief from pain    Extremity Assessment (includes Sensation/Coordination)  Upper Extremity Assessment: Overall WFL for tasks assessed  Lower Extremity Assessment: Defer to PT evaluation RLE Deficits / Details: RLE buckling, requiring stabilization during session    ADLs  Overall ADL's : Needs assistance/impaired Grooming: Oral care, Wash/dry face, Wash/dry hands, Set up, Sitting Grooming Details (indicate cue type and reason): unable to maintain standing for grooming tasks this session Upper Body Bathing: Supervision/ safety, Set up, Sitting Lower Body Bathing: Sit to/from stand, Cueing for back precautions, Cueing for compensatory techniques, Cueing for safety, Moderate assistance Lower Body Bathing Details (indicate cue type and reason): assist for B feet and buttocks, min assist in standing  Upper Body Dressing :  Set up, Sitting Lower Body Dressing: Maximal assistance, Cueing for compensatory techniques, Cueing for safety, Cueing for back precautions, Sit to/from stand Lower Body Dressing Details (indicate cue type and reason): requires total assist for B shoes, cueing to adhere to back precautions; min assist in standing  Toilet Transfer: Moderate assistance, Ambulation, BSC, Regular Toilet, RW(3:1 over toilet ) Toilet Transfer Details (indicate cue type and reason): requires mod assist to ascend from commode and maintain balance during transition Toileting- Clothing Manipulation and Hygiene: Moderate assistance, Sit to/from stand, Cueing for compensatory techniques, Cueing for back precautions Toileting - Clothing Manipulation Details (indicate cue type and reason): Pt educated in use of toilet aide to maximize independence Functional mobility during ADLs: Minimal assistance, Rolling walker(grossly min assist, several LOB with mod-max A to recover ) General ADL Comments: Pt continues to be highly motivated to return to independent. performed sit <>stand multiple times with mod A but currently the RLE requires blocking out to maintain standing - did not attempt further mobility this session    Mobility  Overal bed mobility: Needs Assistance Bed Mobility: Rolling, Sidelying to Sit, Sit to Sidelying Rolling: Supervision Sidelying to sit: Min assist Sit to sidelying: Min assist General bed mobility comments: Pt OOB in recliner at beginning and end of session    Transfers  Overall transfer level: Needs assistance Equipment used: Rolling walker (2 wheeled) Transfers: Sit to/from Stand Sit to Stand: Min assist, Max assist, Mod assist, +2 safety/equipment(assist levels varried.  ) General transfer comment: min assistance to power up unsteadiness in B LEs but no true buckling.  Pt able to follow commands after short gt trial to return to seated position.  Stood one more time to see if BP was decreasing  with position changes and she required moderated assistance to stand and max assistance to sit with LOB from dizziness.      Ambulation / Gait / Stairs / Wheelchair Mobility  Ambulation/Gait Ambulation/Gait assistance: Mod assist, +2 safety/equipment Gait Distance (Feet): 8 Feet Assistive device: Rolling walker (2 wheeled) Gait Pattern/deviations: Step-to pattern, Decreased stride length General Gait Details: Cues for sequencing and use of  UEs in stance phase to avoid buckling.  Pt limited due to complaints of dizziness and require close chair follow from behind.   Gait velocity: decreased Gait velocity interpretation: <1.31 ft/sec, indicative of household ambulator    Posture / Balance Balance Overall balance assessment: Needs assistance Sitting-balance support: No upper extremity supported, Feet supported Sitting balance-Leahy Scale: Good Standing balance support: Bilateral upper extremity supported Standing balance-Leahy Scale: Poor Standing balance comment: heavy reliance on BUE, strong LOB in standing likely due to dizziness but required max assistance to recover.      Special needs/care consideration BiPAP/CPAP: No CPM: No Continuous Drip IV: No Dialysis: No         Life Vest: No Oxygen: No Special Bed: No Trach Size: Mp Wound Vac (area): No       Skin: WDL                               Bowel mgmt: 07/18/18 Bladder mgmt: Continent  Diabetic mgmt: No     Previous Home Environment Living Arrangements: Spouse/significant other Available Help at Discharge: Family, Available 24 hours/day Type of Home: House Home Layout: Able to live on main level with bedroom/bathroom, One level Home Access: Stairs to enter Entrance Stairs-Rails: Right, Left, Can reach both Entrance Stairs-Number of Steps: 4 Bathroom Shower/Tub: Multimedia programmer: Programmer, systems: Yes Home Care Services: No  Discharge Living Setting Plans for Discharge Living  Setting: Patient's home, Lives with (comment)(Spouse and son ) Type of Home at Discharge: House Discharge Home Layout: Two level, Able to live on main level with bedroom/bathroom Discharge Home Access: Stairs to enter Entrance Stairs-Rails: Right, Left, Can reach both Entrance Stairs-Number of Steps: 4 Discharge Bathroom Shower/Tub: Walk-in shower, Door Discharge Bathroom Toilet: Standard Discharge Bathroom Accessibility: Yes How Accessible: Accessible via walker Does the patient have any problems obtaining your medications?: No  Social/Family/Support Systems Patient Roles: Spouse, Parent Contact Information: Spouse: Administrator, sports Anticipated Caregiver: Son PRN days and Spouse PRN nights and weekends Anticipated Caregiver's Contact Information: see above  Ability/Limitations of Caregiver: Spouse works for [employer] and sons work Engineer, civil (consulting) from home Caregiver Availability: Intermittent Discharge Plan Discussed with Primary Caregiver: (Discussed with patient ) Is Caregiver In Agreement with Plan?: (Patient in agreement with plan ) Does Caregiver/Family have Issues with Lodging/Transportation while Pt is in Rehab?: No  Goals/Additional Needs Patient/Family Goal for Rehab: PT/OT: Mod [name]  Expected length of stay: ~7 days  Cultural Considerations: Baptist Dietary Needs: Regular textures and thin liquids Equipment Needs: TBD Special Service Needs: Workers Comp Case Additional Information: Patient injured while here at work at [employer] to Admission and willing to participate: Yes Program Orientation Provided & Reviewed with Pt/Caregiver Including Roles  & Responsibilities: Yes Additional Information Needs: Patty: RN CM with [facility]; [name]: adjuster with [employer]   Information Needs to be Provided By: Team FYI  Barriers to Discharge: Decreased caregiver support, Home environment access/layout(needs to be able to do steps into home and be Mod [name] on main )  Decrease burden of Care  through IP rehab admission: No  Possible need for SNF placement upon discharge: No  Patient Condition: This patient's medical and functional status has changed since the consult dated: 07/16/18 in which the Rehabilitation Physician determined and documented that the patient's condition is appropriate for intensive rehabilitative care in an inpatient rehabilitation facility. See "History of Present Illness" (above) for medical update. Functional changes are:  Min A-Mod A+2-Max A with transfers; Mod A +2 8 feet with rolling walker. Patient's medical and functional status update has been discussed with the Rehabilitation physician and patient remains appropriate for inpatient rehabilitation. Will admit to inpatient rehab today.  Preadmission Screen Completed By:  Gunnar Fusi, 07/20/2018 3:10 PM ______________________________________________________________________   Discussed status with Dr. Naaman Plummer on 07/20/18 at 1510 and received telephone approval for admission today.  Admission Coordinator:  Gunnar Fusi, time 1510/Date 07/20/18           Cosigned by: Meredith Staggers, [name] at 07/20/2018 3:22 PM  Revision History

## 2018-07-21 NOTE — Progress Notes (Signed)
Physical Therapy Session Note  Patient Details  Name: [name] MRN: [id number] Date of Birth: [birth year]  Today's Date: 07/21/2018 PT Individual Time: 1100-1145 PT Individual Time Calculation (min): 45 min   Short Term Goals: Week 1:  PT Short Term Goal 1 (Week 1): =LTGs due to ELOS  Skilled Therapeutic Interventions/Progress Updates:  Pt in bed upon arrival, agreeable to PT session as able. Pt reports ongoing pain but not wanting to take pain meds right now. Discussed need to control pain for participation with therapies. TE: bilat. Df stretches, quad sets. TFA: rolling -supervision rolling Rt using rail, cues for logroll technique. Able to sit up in bed and scoot self up in the bed. Min assist at trunk from sit>sitting. Sitting>sidelying mod assist bilat. LEs. Sitting EOB with weight shifting and scooting toward EOB. Sit<>stand performed with mod assist. Side steps to rt performed with min assist and rw. All mobility is very slow and guarded. Needing breaks throughout due to pain. Following session, pt repositioned and reports improved comfort. All needs in reach.  Therapy Documentation Precautions:  Precautions Precautions: Back Precaution Comments: reviewed 3/3 back precautions Required Braces or Orthoses: Spinal Brace Spinal Brace: Lumbar corset Spinal Brace Comments: lumbar brace from home, applied in sitting Restrictions Weight Bearing Restrictions: No   Pain: 8/10 - declined pain meds at this time. Monitored during session.   See Function Navigator for Current Functional Status.   Therapy/Group: Individual Therapy  Linard Millers, PT 07/21/2018, 11:54 AM

## 2018-07-21 NOTE — Progress Notes (Signed)
Patient information reviewed and entered into eRehab system by Liese Dizdarevic, RN, CRRN, PPS Coordinator.  Information including medical coding, functional ability and quality indicators will be reviewed and updated through discharge.    

## 2018-07-22 ENCOUNTER — Inpatient Hospital Stay (HOSPITAL_COMMUNITY): Payer: Self-pay | Admitting: Physical Therapy

## 2018-07-22 ENCOUNTER — Inpatient Hospital Stay (HOSPITAL_COMMUNITY): Payer: Self-pay | Admitting: Occupational Therapy

## 2018-07-22 MED ORDER — HYDROCORTISONE 1 % EX CREA
TOPICAL_CREAM | Freq: Four times a day (QID) | CUTANEOUS | Status: DC | PRN
  Administered 2018-07-23: 01:00:00 via TOPICAL
  Filled 2018-07-22: qty 28

## 2018-07-22 MED ORDER — DIPHENHYDRAMINE HCL 25 MG PO CAPS
25.0000 mg | ORAL_CAPSULE | Freq: Four times a day (QID) | ORAL | Status: DC | PRN
  Administered 2018-07-22 – 2018-07-23 (×4): 25 mg via ORAL
  Filled 2018-07-22 (×4): qty 1

## 2018-07-22 NOTE — Progress Notes (Signed)
Social Work  Social Work Assessment and Plan  Patient Details  Name: [name] MRN: [id number] Date of Birth: 1960-12-18  Today's Date: 07/22/2018  Problem List:  Patient Active Problem List   Diagnosis Date Noted  . Gait disorder   . Difficulty in urination 07/13/2018  . Back pain 07/12/2018  . Hyperlipidemia 04/16/2018  . Generalized anxiety disorder with panic attacks 04/16/2018  . AKI (acute kidney injury) ([place])   . Benign essential HTN   . Depression with anxiety   . Lumbar radiculopathy 03/09/2018  . Paraparesis ([place]) 03/09/2018  . Essential hypertension   . Chronic nonintractable headache   . Leukocytosis   . Acute blood loss anemia   . Postoperative pain   . Generalized weakness   . Spinal stenosis at L4-L5 level 03/04/2018  . Spinal stenosis of lumbar region 12/28/2015  . Chest pain 04/16/2015   Past Medical History:  Past Medical History:  Diagnosis Date  . Anxiety   . Back pain    related to spinal stenosis and disc problem, radiates down left buttocks to leg., weakness occ.  [name] Dyspnea   . Headache   . Hyperlipidemia   . Hypertension   . PONV (postoperative nausea and vomiting)   . Vaginal foreign object    "Uses Femring"   Past Surgical History:  Past Surgical History:  Procedure Laterality Date  . ABDOMINAL HYSTERECTOMY    . CARDIAC CATHETERIZATION N/A 04/18/2015   Procedure: Left Heart Cath and Coronary Angiography;  Surgeon: [name], MD;  Location: [facility];  Service: Cardiovascular;  Laterality: N/A;  . FOOT SURGERY Bilateral    [place] "bunion,bone spur, tendon" (1) -6'16, (1)-10'16  . IR EPIDUROGRAPHY  07/21/2018  . LUMBAR LAMINECTOMY/DECOMPRESSION MICRODISCECTOMY Bilateral 12/28/2015   Procedure: MICRO LUMBAR DECOMPRESSION L4 - L5 BILATERALLY;  Surgeon: [name], MD;  Location: [facility];  Service: Orthopedics;  Laterality: Bilateral;  . LUMBAR LAMINECTOMY/DECOMPRESSION MICRODISCECTOMY Bilateral 03/04/2018   Procedure: Revision of Microlumbar Decompression Bilateral Lumbar Four-Five;  Surgeon: [name], MD;  Location: [facility];  Service: Orthopedics;  Laterality: Bilateral;  90 mins  . WISDOM TOOTH EXTRACTION    . WOUND EXPLORATION N/A 03/04/2018   Procedure: EXPLORATION OF LUMBAR DECOMPRESSION WOUND;  Surgeon: [name], MD;  Location: [facility];  Service: Orthopedics;  Laterality: N/A;   Social History:  reports that she has never smoked. She has never used smokeless tobacco. She reports that she does not drink alcohol or use drugs.  Family / Support Systems Marital Status: Married Patient Roles: Spouse, Parent Spouse/Significant Other: spouse, Nelida Gores @ (H) [phone] or (C) [phone] Children: adult son, Jimmie Molly Anticipated Caregiver: Son PRN days and Spouse PRN nights and weekends Ability/Limitations of Caregiver: Spouse works for [employer] and sons work upstairs from home Caregiver Availability: Intermittent Family Dynamics: Patient describes both spouse and son as very supportive.  They did assist her following prior back surgery in May.  Social History Preferred language: English Religion: Baptist Cultural Background: NA Read: Yes Write: Yes Legal Hisotry/Current Legal Issues: This is a Designer, jewellery case Guardian/Conservator: None - per MD, pt is capable of making decisions on her own behalf.   Abuse/Neglect Abuse/Neglect Assessment Can Be Completed: Yes Physical Abuse: Denies Verbal Abuse: Denies Sexual Abuse: Denies Exploitation of patient/patient's resources: Denies Self-Neglect: Denies  Emotional Status Pt's affect, behavior adn adjustment status: Patient sitting up in bed, very pleasant and familiar to this social worker from [facility] stay in May 2019.  Completes assessment  interview and review without any difficulty.  She admits to much frustration with the situation overall as she had just begun her return to work when she suffered this new injury.  She denies  any significant emotional distress, however, will monitor and refer for neuropsych as indicated. Recent Psychosocial Issues: Patient with recent back surgery in May 2019. Pyschiatric History: Anxiety has been a chronic issue for patient. Substance Abuse History: None  Patient / Family Perceptions, Expectations & Goals Pt/Family understanding of illness & functional limitations: Patient and family have a good understanding of the exacerbation of her symptoms related to recent back surgery and new injury.  Very good understanding of CIR. Premorbid pt/family roles/activities: Patient was completely independent and had return to work approximately 1 month ago on a reduced at work schedule. Anticipated changes in roles/activities/participation: No significant change anticipated as goals have been set for independent. Pt/family expectations/goals: "I hope I can get back to work but I will NOT be sitting in a rolling chair."  [place]: None Premorbid Home Care/DME Agencies: Other (Comment)(HH with [facility]; [facility] on [address]) Transportation available at discharge: Yes Resource referrals recommended: Neuropsychology  Discharge Planning Living Arrangements: Spouse/significant other Support Systems: Spouse/significant other Type of Residence: Private residence [employer] Resources: Multimedia programmer (specify)(Worker's comp) Museum/gallery curator Screen Referred: No Living Expenses: Higher education careers adviser Management: Spouse Does the patient have any problems obtaining your medications?: No Home Management: Patient and family Patient/Family Preliminary Plans: Patient to return home with spouse and adult son.  Anticipate resumption of therapy at outpatient setting. Social Work Anticipated Follow Up Needs: HH/OP Expected length of stay: ~7 days   Clinical Impression Patient well-known to CIR from prior stay in May 2019 following back surgery.  Returns following a fall at work with  exacerbation of deficits.  Patient admits she is very frustrated with the overall situation as she had only returned to work for approximately 1 month at the time of injury.  She is very motivated and familiar with the purpose of CIR and is eager to DC home ASAP.  Patient with known anxiety, however, not severe.  Will monitor and refer for neuropsychology as indicated.  Social work to follow for support and discharge planning needs.  Keeghan Bialy 07/22/2018, 10:36 AM

## 2018-07-22 NOTE — Progress Notes (Signed)
[place] PHYSICAL MEDICINE & REHABILITATION     PROGRESS NOTE    Subjective/Complaints: Pt feels sore today but a little better. Complains of itching in low back. Ready for therapy  ROS: Patient denies fever, rash, sore throat, blurred vision, nausea, vomiting, diarrhea, cough, shortness of breath or chest pain, joint or back pain, headache, or mood change.   Objective:  Ir Epidurography  Result Date: 07/21/2018 CLINICAL DATA:  Lumbosacral spondylosis without myelopathy FLUOROSCOPY TIME:  30 seconds PROCEDURE: LUMBAR EPIDURAL The procedure, risks, benefits, and alternatives were explained to the patient. Questions regarding the procedure were encouraged and answered. The patient understands and consents to the procedure. Latex free gloves were utilized. LUMBAR EPIDURAL INJECTION: An interlaminar approach was performed on the right at L5-S1. The overlying skin was cleansed and anesthetized. A 20 gauge epidural needle was advanced using loss-of-resistance technique. DIAGNOSTIC EPIDURAL INJECTION: Injection of Omnipaque 180 shows a good epidural pattern with spread above and below the level of needle placement, primarily on the right. No vascular opacification is seen. THERAPEUTIC EPIDURAL INJECTION: 120 mg of Depo-Medrol mixed with 4 cc 1% lidocaine were instilled. The procedure was well-tolerated, and the patient was discharged thirty minutes following the injection in good condition. COMPLICATIONS: None IMPRESSION: Technically successful epidural injection on the right at L5-S1. Electronically Signed   By: Marybelle Killings M.D.   On: 07/21/2018 16:55   Recent Labs    07/21/18 0533  WBC 7.5  HGB 11.8*  HCT 36.7  PLT 241   Recent Labs    07/21/18 0533  NA 138  K 4.5  CL 99  GLUCOSE 85  BUN 15  CREATININE 0.94  CALCIUM 8.5*   CBG (last 3)  No results for input(s): GLUCAP in the last 72 hours.  Wt Readings from Last 3 Encounters:  07/21/18 66.1 kg  07/12/18 66.7 kg  07/05/18 68 kg      Intake/Output Summary (Last 24 hours) at 07/22/2018 1100 Last data filed at 07/22/2018 0900 Gross per 24 hour  Intake 540 ml  Output -  Net 540 ml    Vital Signs: Blood pressure (!) 150/92, pulse 77, temperature 97.8 F (36.6 C), temperature source Oral, resp. rate 16, height 5\' 5"  (1.651 m), weight 66.1 kg, SpO2 100 %. Physical Exam:  Constitutional: No distress . Vital signs reviewed. HEENT: EOMI, oral membranes moist Neck: supple Cardiovascular: RRR without murmur. No JVD    Respiratory: CTA Bilaterally without wheezes or rales. Normal effort    GI: BS +, non-tender, non-distended  Musculoskeletal: LB TTP. Did not perform any provocative testing.  Neurological: Nocranial nerve deficit.Coordinationnormal.  UE 5/5. LE 1-3/4 and inconsistent with effort. Inconsistent sensory loss bilateral lower ext. DTR's absent in LE's.---really unchannged  Skin:low back only slightly reddened where she had been rubbing Psychiatric: She has anormal mood and affect. Herbehavior is normal.Judgmentand thought contentnormal.   Assessment/Plan: 1. Functional deficits secondary to lumbar radiculopathy which require 3+ hours per day of interdisciplinary therapy in a comprehensive inpatient rehab setting. Physiatrist is providing close team supervision and 24 hour management of active medical problems listed below. Physiatrist and rehab team continue to assess barriers to discharge/monitor patient progress toward functional and medical goals.  Function:  Bathing Bathing position   Position: Shower  Bathing parts Body parts bathed by patient: Right arm, Left arm, Chest, Abdomen, Front perineal area, Right upper leg, Left upper leg Body parts bathed by helper: Buttocks, Right lower leg, Left lower leg, Back  Bathing assist Assist Level: (  mod a)      Upper Body Dressing/Undressing Upper body dressing   What is the patient wearing?: Pull over shirt/dress, Bra Bra - Perfomed by  patient: Thread/unthread right bra strap, Thread/unthread left bra strap, Hook/unhook bra (pull down sports bra)   Pull over shirt/dress - Perfomed by patient: Thread/unthread right sleeve, Thread/unthread left sleeve, Put head through opening, Pull shirt over trunk          Upper body assist Assist Level: Set up, Supervision or verbal cues   Set up : To obtain clothing/put away  Lower Body Dressing/Undressing Lower body dressing   What is the patient wearing?: Pants, Non-skid slipper socks, Underwear Underwear - Performed by patient: Thread/unthread left underwear leg, Pull underwear up/down, Thread/unthread right underwear leg Underwear - Performed by helper: Thread/unthread right underwear leg Pants- Performed by patient: Thread/unthread left pants leg, Pull pants up/down Pants- Performed by helper: Thread/unthread right pants leg Non-skid slipper socks- Performed by patient: Don/doff left sock, Don/doff right sock Non-skid slipper socks- Performed by helper: Don/doff right sock, Don/doff left sock                  Lower body assist Assist for lower body dressing: Touching or steadying assistance (Pt > 75%)      Toileting Toileting   Toileting steps completed by patient: Adjust clothing prior to toileting, Performs perineal hygiene, Adjust clothing after toileting   Toileting Assistive Devices: Grab bar or rail  Toileting assist Assist level: Touching or steadying assistance (Pt.75%)   Transfers Chair/bed transfer   Chair/bed transfer method: Stand pivot Chair/bed transfer assist level: Moderate assist (Pt 50 - 74%/lift or lower) Chair/bed transfer assistive device: Armrests, Medical sales representative     Max distance: 10 Assist level: Touching or steadying assistance (Pt > 75%)   Wheelchair   Type: Manual Max wheelchair distance: 150 Assist Level: Supervision or verbal cues  Cognition Comprehension Comprehension assist level: Follows complex  conversation/direction with extra time/assistive device  Expression Expression assist level: Expresses complex ideas: With extra time/assistive device  Social Interaction Social Interaction assist level: Interacts appropriately with others with medication or extra time (anti-anxiety, antidepressant).  Problem Solving Problem solving assist level: Solves complex problems: With extra time  Memory Memory assist level: More than reasonable amount of time  Medical Problem List and Plan: 1.Decreased functional mobilitysecondary to acute onchronic lumbar radiculopathy and foraminal stenosis bilaterally with decompression May 2019. Recent fall with exacerbation of pain and motor or sensory deficits.  --received ESI yesterday  -continue therapies with the hope of improved pain post injection 2. DVT Prophylaxis/Anticoagulation: SCDs. 3. Pain Management:Neurontin 300 mg 3 times daily, Robaxin 500 mg 3 times daily, Ultram 50 mg every 12 hours, oxycodone as needed -consider further titration -OOB 4. Mood:Ativan 0.25-0.5 mg daily as needed 5. Neuropsych: This patientiscapable of making decisions on herown behalf. 6. Skin/Wound Care:Routine skin checks 7. Fluids/Electrolytes/Nutrition:encourage PO  =I personally reviewed the patient's labs today.   8.Hypertension. Coreg 6.25 mg twice daily. Monitor with increased mobility 9.Hyperlipidemia. Lipitor 20 mg daily. 10.Constipation. Laxative assistance.   -has not been eating much  -if no BM today will advance regimen   LOS (Days) 2 A FACE TO FACE EVALUATION WAS PERFORMED  Meredith Staggers, MD 07/22/2018 11:00 AM

## 2018-07-22 NOTE — Progress Notes (Signed)
Physical Therapy Session Note  Patient Details  Name: [name] MRN: [id number] Date of Birth: 03/08/1961  Today's Date: 07/22/2018 PT Individual Time:1545-1655  PT Individual Time Calculation (min): 79mn   Short Term Goals: Week 1:  PT Short Term Goal 1 (Week 1): =LTGs due to ELOS  Skilled Therapeutic Interventions/Progress Updates:   Pt received sitting in WC and agreeable to PT. WC mobiltiy through hall of hospital x 1520fwith supervision assist from PT. Stand pivot transfer to nustep with min assist.   Sit<>stand following nustep and reports dizziness. Squat pivot to WC. PT provided pt with crackers and water to settle stomach following transfer. PT assessed orthostatic vital signs. Sitting: 150/98. Standing 130/102. Standing at 2 min 135/86. Pt report only slight dizziness on the 3rd and 4th attempt to perform sit<>stand transfer.   Gait training with RW x 2045fnd [id number] min assist from PT, pt noted to have dysmetric movements on the LLE and decreased step length/foot clearance on BLE.   Pt returned to room and performed stand pivot transfer to bed with min assist and UE support on RW and bed rails. Sit>supine completed with  Supervision assist and [facility]. Pt able to remove back brace and AFO without assist from PT at EOB. Pt left supine in bed with call bell in reach and all needs met.       Therapy Documentation Precautions:  Precautions Precautions: Back Precaution Comments: reviewed 3/3 back precautions Required Braces or Orthoses: Spinal Brace Spinal Brace: Lumbar corset Spinal Brace Comments: lumbar brace from home, applied in sitting Restrictions Weight Bearing Restrictions: No    Vital Signs: Therapy Vitals Temp: 98.2 F (36.8 C) Pulse Rate: 95 Resp: 18 BP: (!) 146/80 Patient Position (if appropriate): Lying Oxygen Therapy SpO2: 97 % O2 Device: Room Air Pain: Pain Assessment Pain Scale: 0-10 Pain Score: 7  Pain Type: Acute  pain Pain Location: Back Pain Orientation: Posterior Pain Descriptors / Indicators: Pressure Pain Frequency: Constant Pain Onset: On-going Patients Stated Pain Goal: 1 Pain Intervention(s): Medication (See eMAR)     Balance: Standardized Balance Assessment Standardized Balance Assessment: Timed Up and Go Test Timed Up and Go Test TUG: Normal TUG Normal TUG (seconds): 156(RW, min guard)   See Function Navigator for Current Functional Status.   Therapy/Group: Individual Therapy  AustLorie Phenix6/2019, 3:55 PM

## 2018-07-22 NOTE — Care Management (Signed)
Inpatient [facility] Individual Statement of Services  Patient Name:  [name]  Date:  07/22/2018  Welcome to the [facility].  Our goal is to provide you with an individualized program based on your diagnosis and situation, designed to meet your specific needs.  With this comprehensive rehabilitation program, you will be expected to participate in at least 3 hours of rehabilitation therapies Monday-Friday, with modified therapy programming on the weekends.  Your rehabilitation program will include the following services:  Physical Therapy (PT), Occupational Therapy (OT), 24 hour per day rehabilitation nursing, Therapeutic Recreaction (TR), Case Management (Social Worker), Rehabilitation Medicine, Nutrition Services and Pharmacy Services  Weekly team conferences will be held on Tuesdays to discuss your progress.  Your Social Worker will talk with you frequently to get your input and to update you on team discussions.  Team conferences with you and your family in attendance may also be held.  Expected length of stay: 7-10 days   Overall anticipated outcome: independent  Depending on your progress and recovery, your program may change. Your Social Worker will coordinate services and will keep you informed of any changes. Your Social Worker's name and contact numbers are listed  below.  The following services may also be recommended but are not provided by the [facility] will be made to provide these services after discharge if needed.  Arrangements include referral to agencies that provide these services.  Your insurance has been verified to be:  Worker's Comp Your primary doctor is:  Betty [name]  Pertinent information will be shared with your doctor and your insurance company.  Social Worker:  [place], [place] or ([phone]   Information discussed with and copy given to patient by: Lennart Pall, 07/22/2018, 10:41 AM

## 2018-07-22 NOTE — Progress Notes (Signed)
Physical Therapy Session Note  Patient Details  Name: [name] MRN: [id number] Date of Birth: [birth year]  Today's Date: 07/22/2018 PT Individual Time: 1415-1500 PT Individual Time Calculation (min): 45 min   Short Term Goals: Week 1:  PT Short Term Goal 1 (Week 1): =LTGs due to ELOS  Skilled Therapeutic Interventions/Progress Updates: Pt received seated in bed, c/o pain as below and agreeable to treatment. Supine>sit with S. Ambulatory transfer with RW and min guard including side steps x5' to sit in w/c. Transferred to/from gym totalA for energy conservation. Gait x10' RW and min guard, slow speed. Sit <>stand x5 reps with min guard, cues for anterior weight shift stand>sit to improve eccentric control. Standing alternating toe taps to 3" step with BUE support on RW with min guard, cues for exaggerating RLE movements to improve clearance on step. TUG test performed with RW and min guard in 2 min 36 sec; educated pt in results and purpose of tracking progress over time. Stand pivot to w/c with RW and min guard. Ascent/descent 2 steps 6" height with B handrails and min guard, cues for power production with LLE step up. Returned to room as above; remained seated in w/c, all needs in reach at end of session.      Therapy Documentation Precautions:  Precautions Precautions: Back Precaution Comments: reviewed 3/3 back precautions Required Braces or Orthoses: Spinal Brace Spinal Brace: Lumbar corset Spinal Brace Comments: lumbar brace from home, applied in sitting Restrictions Weight Bearing Restrictions: No Pain: Pain Assessment Pain Scale: 0-10 Pain Score: 7  Pain Type: Acute pain Pain Location: Back Pain Orientation: Posterior Pain Descriptors / Indicators: Pressure Pain Frequency: Constant Pain Onset: On-going Patients Stated Pain Goal: 1 Pain Intervention(s): Medication (See eMAR)   See Function Navigator for Current Functional Status.   Therapy/Group: Individual  Therapy  Corliss Skains 07/22/2018, 3:01 PM

## 2018-07-22 NOTE — Progress Notes (Signed)
Pt called writer in to room d/t itching on Right buttocks. Upon inspection the medial area of buttocks had raised welts. There was abrasions throughout the area secondary to patient scratching the area. An inspection of the patient's body was unremarkable for any further abnormalities.  Dan A. PA called and orders received.

## 2018-07-22 NOTE — Progress Notes (Signed)
Subjective: Back pain.   Objective: Vital signs in last 24 hours: Temp:  [97.8 F (36.6 C)-98.4 F (36.9 C)] 97.8 F (36.6 C) (09/26 0916) Pulse Rate:  [77-92] 77 (09/26 0916) Resp:  [14-16] 16 (09/26 0513) BP: (124-150)/(67-92) 150/92 (09/26 0916) SpO2:  [98 %-100 %] 100 % (09/26 0916)  Intake/Output from previous day: 09/25 0701 - 09/26 0700 In: 540 [P.O.:540] Out: -  Intake/Output this shift: Total I/O In: 240 [P.O.:240] Out: -   Recent Labs    07/21/18 0533  HGB 11.8*   Recent Labs    07/21/18 0533  WBC 7.5  RBC 4.02  HCT 36.7  PLT 241   Recent Labs    07/21/18 0533  NA 138  K 4.5  CL 99  CO2 32  BUN 15  CREATININE 0.94  GLUCOSE 85  CALCIUM 8.5*   Recent Labs    07/21/18 0958  INR 0.99    Motor left with 4/5 EHL and TA though giving way. Right unchanged though some give way noted. Pain lower lumbar with palpation.      Assessment/Plan:  S/P fall with LBP and annular tear small protrusion L45. Continue current care Awaiting ESI. Pending rehab   [name] C 07/22/2018, 9:25 AM

## 2018-07-22 NOTE — Progress Notes (Signed)
Occupational Therapy Session Note  Patient Details  Name: [name] MRN: [id number] Date of Birth: June 05, 1961  Today's Date: 07/22/2018 OT Individual Time: 9371-6967 OT Individual Time Calculation (min): 71 min    Short Term Goals: Week 1:  OT Short Term Goal 1 (Week 1): STGs=LTGs secondary to short estimated LOS  Skilled Therapeutic Interventions/Progress Updates:    Upon entering the room, pt with increased pain and reports feelings of "pressure" from procedure yesterday. RN present and aware. Medications given and pt agreeable to OT intervention this session. IV covered to decrease risk of infection with self care tasks. Pt standing with mod lifting assistance and posterior bias. Pt ambulating 10' into bathroom with min A and slow gait. Pt transferred onto TTB with min verbal cues for technique and min A with use of RW and grab bars. Pt seated on TTB and given LH sponge to increase I with LB self care. Pt returning to sit on EOB for dressing tasks with focus on figure four position to thread clothing onto feet. Pt standing with mod A to pull over B hips. Pt taking rest break and then seated at sink for grooming tasks with overall supervision and set up A to obtain all needed items. Pt returning to bed and K pad placed for pain management. Call bell and all needed items within reach. Bed alarm activated.   Therapy Documentation Precautions:  Precautions Precautions: Back Precaution Comments: reviewed 3/3 back precautions Required Braces or Orthoses: Spinal Brace Spinal Brace: Lumbar corset Spinal Brace Comments: lumbar brace from home, applied in sitting Restrictions Weight Bearing Restrictions: No General:   Vital Signs: Therapy Vitals Temp: 97.8 F (36.6 C) Temp Source: Oral Pulse Rate: 77 BP: (!) 150/92 Patient Position (if appropriate): Lying Oxygen Therapy SpO2: 100 % O2 Device: Room Air Pain: Pain Assessment Pain Scale: 0-10 Pain Score: 6  Pain Type:  Chronic pain Pain Location: Back Pain Orientation: Posterior Pain Descriptors / Indicators: Pressure Pain Frequency: Constant Pain Onset: Gradual Pain Intervention(s): Medication (See eMAR)  See Function Navigator for Current Functional Status.   Therapy/Group: Individual Therapy  [name] 07/22/2018, 9:21 AM

## 2018-07-22 NOTE — Progress Notes (Signed)
Physical Therapy Session Note  Patient Details  Name: [name] MRN: [id number] Date of Birth: [birth year]  Today's Date: 07/22/2018 PT Individual Time: 1115-1200 PT Individual Time Calculation (min): 45 min   Short Term Goals: Week 1:  PT Short Term Goal 1 (Week 1): =LTGs due to ELOS  Skilled Therapeutic Interventions/Progress Updates:    c/o 7/10 pain in lower back, monitored throughout session and applied heat at end of session.  Session focus on functional mobility and activity tolerance.  Pt requesting to toilet at start of session.  Supervision to come to sitting EOB and min guard for sit<>stand transfers at EOB and for toilet transfers.  Pt ambulated throughout room into bathroom with Rw and min guard, min cues for hand placement on walker during turning.  Pt performed 3/3 toileting steps with close supervision/min guard.  Stair negotiation 2x2 steps with 2 rails, mod assist.  Pt with LLE shaking during power up and some buckling noted when attempting to increase stair number to 3.  Rest break between trials of stair negotiation.  Pt propelled w/c back to room at end of session and positioned back to bed with mod assist for LEs.  Call bell in reach and needs met.   Therapy Documentation Precautions:  Precautions Precautions: Back Precaution Comments: reviewed 3/3 back precautions Required Braces or Orthoses: Spinal Brace Spinal Brace: Lumbar corset Spinal Brace Comments: lumbar brace from home, applied in sitting Restrictions Weight Bearing Restrictions: No   See Function Navigator for Current Functional Status.   Therapy/Group: Individual Therapy  Michel Santee 07/22/2018, 12:36 PM

## 2018-07-23 ENCOUNTER — Inpatient Hospital Stay (HOSPITAL_COMMUNITY): Payer: Self-pay | Admitting: Physical Therapy

## 2018-07-23 ENCOUNTER — Inpatient Hospital Stay (HOSPITAL_COMMUNITY): Payer: Self-pay

## 2018-07-23 ENCOUNTER — Inpatient Hospital Stay (HOSPITAL_COMMUNITY): Payer: Self-pay | Admitting: Occupational Therapy

## 2018-07-23 MED ORDER — GABAPENTIN 400 MG PO CAPS
400.0000 mg | ORAL_CAPSULE | Freq: Three times a day (TID) | ORAL | Status: DC
  Administered 2018-07-23 – 2018-08-03 (×33): 400 mg via ORAL
  Filled 2018-07-23 (×34): qty 1

## 2018-07-23 NOTE — Progress Notes (Signed)
Physical Therapy Session Note  Patient Details  Name: [name] MRN: [id number] Date of Birth: Jun 10, 1961  Today's Date: 07/23/2018 PT Individual Time: 0800-0900 PT Individual Time Calculation (min): 60 min   Short Term Goals: Week 1:  PT Short Term Goal 1 (Week 1): =LTGs due to ELOS  Skilled Therapeutic Interventions/Progress Updates:    Pt received semi-reclined in bed, agreeable to PT. Pt reports some low back and L knee soreness, not rated and declines any intervention. Bed mobility Supervision. Pt dons clothes seated EOB with setup assist. SPT bed to w/c with RW and min A. Ambulation x 10', x 12' with RW and mod A. Pt exhibits decreased RLE clearance with gait despite use of AFO due to L knee pain/instability in standing. Standing 1" step taps 2 x 10 reps with RW and mod A for balance, pt exhibits fair tolerance for stance on LLE. Standing mini-squats x 8 reps, x 5 reps with RW for support and mod A for balance. Pt left seated in w/c in room with needs in reach at end of therapy session.  Therapy Documentation Precautions:  Precautions Precautions: Back Precaution Comments: reviewed 3/3 back precautions Required Braces or Orthoses: Spinal Brace Spinal Brace: Lumbar corset Spinal Brace Comments: lumbar brace from home, applied in sitting Restrictions Weight Bearing Restrictions: No  See Function Navigator for Current Functional Status.   Therapy/Group: Individual Therapy  Excell Seltzer, PT, DPT  07/23/2018, 12:26 PM

## 2018-07-23 NOTE — Progress Notes (Signed)
Occupational Therapy Session Note  Patient Details  Name: NEOMI LAIDLER MRN: [id number] Date of Birth: [birth year]  Today's Date: 07/23/2018 OT Individual Time: 4383-8184 OT Individual Time Calculation (min): 60 min    Short Term Goals: Week 1:  OT Short Term Goal 1 (Week 1): STGs=LTGs secondary to short estimated LOS  Skilled Therapeutic Interventions/Progress Updates:    1:1 SElf care retraining at sink level (pt's choice) with setup of supplies. Pt can bathe and dress (except for footware) with supervision/ contact guard with standing. Pt did require cues for crossing LEs to thread underwear and pants to maintain back precautions. Pt required A to don shoe with AFO. Transitioned into the gym and performed short distant functional ambulation with RW with cues for RW management and tactile cues for knee control on the left . Ambulated ~20 feet. Sitting EOM focus on sit to stands and dynamic standing with left knee control (avoiding hyperextension and buckling).  Also focus on stepping over a threshold with only one UE and then bilateral. Also educated, demonstrated  On shower stall transfers with RW (stepping over threshold backwards).  Transferred back to bed with contact guard with RW. LEft resting in bed.     Therapy Documentation Precautions:  Precautions Precautions: Back Precaution Comments: reviewed 3/3 back precautions Required Braces or Orthoses: Spinal Brace Spinal Brace: Lumbar corset Spinal Brace Comments: lumbar brace from home, applied in sitting Restrictions Weight Bearing Restrictions: No Pain:  mild back discomfort at times but able to rest and continue  See Function Navigator for Current Functional Status.   Therapy/Group: Individual Therapy  Willeen Cass [facility] 07/23/2018, 2:42 PM

## 2018-07-23 NOTE — Progress Notes (Signed)
Occupational Therapy Session Note  Patient Details  Name: [name] MRN: [id number] Date of Birth: Dec 14, 1960  Today's Date: 07/23/2018 OT Individual Time: 1430-1500 OT Individual Time Calculation (min): 30 min    Short Term Goals: Week 1:  OT Short Term Goal 1 (Week 1): STGs=LTGs secondary to short estimated LOS  Skilled Therapeutic Interventions/Progress Updates:    1:1. Pt received semi reclined in bed. Pt reporting need to toilet and no pain. Pt declining intervention outside of room. Pt ambulates to toilet with RW and CGA fading to supervision to sit on BSC overtoilet. Pt able to void B&B with increased time and leans laterally to complete hygiene. Pt able to completes standing clothing management with supervision. Pt able to teach back footwear technique taught this morning in previous OT session without AE for donning socks and shoes. Exited session with pt seated in w/c awaiting PT arrival with call light in reach and all needs met  Therapy Documentation Precautions:  Precautions Precautions: Back Precaution Comments: reviewed 3/3 back precautions Required Braces or Orthoses: Spinal Brace Spinal Brace: Lumbar corset Spinal Brace Comments: lumbar brace from home, applied in sitting Restrictions Weight Bearing Restrictions: No General:   Vital Signs: Therapy Vitals Temp: 97.8 F (36.6 C) Pulse Rate: 82 Resp: 16 BP: 122/77 Patient Position (if appropriate): Sitting Oxygen Therapy SpO2: 99 % O2 Device: Room Air Pain:   ADL:   Vision   Perception    Praxis   Exercises:   Other Treatments:    See Function Navigator for Current Functional Status.   Therapy/Group: Individual Therapy  Tonny Branch 07/23/2018, 2:40 PM

## 2018-07-23 NOTE — Progress Notes (Signed)
Physical Therapy Session Note  Patient Details  Name: [name] MRN: [id number] Date of Birth: 09-Mar-1961  Today's Date: 07/23/2018 PT Individual Time: 1500-1600 PT Individual Time Calculation (min): 60 min   Short Term Goals: Week 1:  PT Short Term Goal 1 (Week 1): =LTGs due to ELOS  Skilled Therapeutic Interventions/Progress Updates:    c/o discomfort in abdomen ?related to bowels.  Monitored throughout session.  Session focus on community level w/c mobility and UE strengthening/activity tolerance.    Pt propelled w/c outside with BUEs focus on community mobility, navigating elevators/doors/uneven surfaces, and overall cardiovascular endurance.  PT instructed pt in BUE therex with 3# dowel for 2x20 reps of bicep curls, shoulder press, and rows with rest breaks between each exercise/set.  Engaged in conversation regarding d/c planning with expected DME and progress towards goals.  Pt returned to room at end of session and left upright in w/c with call bell in reach and needs met.   Therapy Documentation Precautions:  Precautions Precautions: Back Precaution Comments: reviewed 3/3 back precautions Required Braces or Orthoses: Spinal Brace Spinal Brace: Lumbar corset Spinal Brace Comments: lumbar brace from home, applied in sitting Restrictions Weight Bearing Restrictions: No  See Function Navigator for Current Functional Status.   Therapy/Group: Individual Therapy  Michel Santee 07/23/2018, 4:27 PM

## 2018-07-23 NOTE — IPOC Note (Signed)
Overall Plan of Care [facility]) Patient Details Name: [name] MRN: [id number] DOB: 1961/09/06  Admitting Diagnosis: <principal problem not specified>  Hospital Problems: Active Problems:   Lumbar radiculopathy   Gait disorder     Functional Problem List: Nursing Bladder, Bowel, Pain, Edema, Endurance, Safety, Medication Management, Motor, Skin Integrity  PT Balance, Endurance, Motor, Pain  OT Balance, Endurance, Motor, Pain, Safety  SLP    TR         Basic ADL's: OT Bathing, Grooming, Dressing, Toileting     Advanced  ADL's: OT       Transfers: PT Bed Mobility, Bed to Chair, Car, Furniture, Floor  OT Toilet, Tub/Shower     Locomotion: PT Stairs, Emergency planning/management officer, Ambulation     Additional Impairments: OT None  SLP        TR      Anticipated Outcomes Item Anticipated Outcome  Self Feeding n/a  Swallowing      Basic self-care  mod I   Toileting  mod I    Bathroom Transfers mod I - toilet and supervision - shower  Bowel/Bladder  mod indip  Transfers  mod I  Locomotion  mod I ambulatory with LRAD  Communication     Cognition     Pain  less<3  Safety/Judgment  min assist   Therapy Plan: PT Intensity: Minimum of 1-2 x/day ,45 to 90 minutes PT Frequency: 5 out of 7 days PT Duration Estimated Length of Stay: 7-10 days OT Intensity: Minimum of 1-2 x/day, 45 to 90 minutes OT Frequency: 5 out of 7 days OT Duration/Estimated Length of Stay: 7-10 days      Team Interventions: Nursing Interventions Patient/Family Education, Pain Management, Bowel Management, Disease Management/Prevention, Medication Management, Skin Care/Wound Management  PT interventions Ambulation/gait training, DME/adaptive equipment instruction, Neuromuscular re-education, Psychosocial support, UE/LE Strength taining/ROM, Wheelchair propulsion/positioning, Stair training, Training and development officer, Discharge planning, Pain management, Therapeutic Activities, UE/LE  Coordination activities, Functional mobility training, Patient/family education, Splinting/orthotics, Therapeutic Exercise  OT Interventions Balance/vestibular training, Self Care/advanced ADL retraining, Therapeutic Exercise, DME/adaptive equipment instruction, Pain management, Community reintegration, Patient/family education, UE/LE Coordination activities, Discharge planning, Functional mobility training, Psychosocial support, Therapeutic Activities  SLP Interventions    TR Interventions    SW/CM Interventions Discharge Planning, Psychosocial Support, Patient/Family Education   Barriers to Discharge MD  Medical stability  Nursing      PT Decreased caregiver support    OT Other (comments) none known at this time  SLP      SW       Team Discharge Planning: Destination: PT-Home ,OT- Home , SLP-  Projected Follow-up: PT-Home health PT, OT-  Outpatient OT, SLP-  Projected Equipment Needs: PT-None recommended by PT, OT- None recommended by OT, SLP-  Equipment Details: PT-has SPC, RW, and w/c at home, OT-  Patient/family involved in discharge planning: PT- Patient,  OT-Patient, SLP-   MD ELOS: 7-10 days Medical Rehab Prognosis:  Excellent Assessment: The patient has been admitted for CIR therapies with the diagnosis of acute on chronic lumbar radiculopathy/gait disorder. The team will be addressing functional mobility, strength, stamina, balance, safety, adaptive techniques and equipment, self-care, bowel and bladder mgt, patient and caregiver education, pain mgt, orthotics, ego support, community reentry. Goals have been set at mod I for basic mobility and self-care.    Meredith Staggers, MD, FAAPMR      See Team Conference Notes for weekly updates to the plan of care

## 2018-07-23 NOTE — Progress Notes (Signed)
[place] PHYSICAL MEDICINE & REHABILITATION     PROGRESS NOTE    Subjective/Complaints: Back and left leg feel better. Still feels "weak" and has pain in leg however  ROS: Patient denies fever, rash, sore throat, blurred vision, nausea, vomiting, diarrhea, cough, shortness of breath or chest pain, joint or back pain, headache, or mood change.    Objective:  Ir Epidurography  Result Date: 07/21/2018 CLINICAL DATA:  Lumbosacral spondylosis without myelopathy FLUOROSCOPY TIME:  30 seconds PROCEDURE: LUMBAR EPIDURAL The procedure, risks, benefits, and alternatives were explained to the patient. Questions regarding the procedure were encouraged and answered. The patient understands and consents to the procedure. Latex free gloves were utilized. LUMBAR EPIDURAL INJECTION: An interlaminar approach was performed on the right at L5-S1. The overlying skin was cleansed and anesthetized. A 20 gauge epidural needle was advanced using loss-of-resistance technique. DIAGNOSTIC EPIDURAL INJECTION: Injection of Omnipaque 180 shows a good epidural pattern with spread above and below the level of needle placement, primarily on the right. No vascular opacification is seen. THERAPEUTIC EPIDURAL INJECTION: 120 mg of Depo-Medrol mixed with 4 cc 1% lidocaine were instilled. The procedure was well-tolerated, and the patient was discharged thirty minutes following the injection in good condition. COMPLICATIONS: None IMPRESSION: Technically successful epidural injection on the right at L5-S1. Electronically Signed   By: Marybelle Killings M.D.   On: 07/21/2018 16:55   Recent Labs    07/21/18 0533  WBC 7.5  HGB 11.8*  HCT 36.7  PLT 241   Recent Labs    07/21/18 0533  NA 138  K 4.5  CL 99  GLUCOSE 85  BUN 15  CREATININE 0.94  CALCIUM 8.5*   CBG (last 3)  No results for input(s): GLUCAP in the last 72 hours.  Wt Readings from Last 3 Encounters:  07/21/18 66.1 kg  07/12/18 66.7 kg  07/05/18 68 kg      Intake/Output Summary (Last 24 hours) at 07/23/2018 0836 Last data filed at 07/22/2018 1700 Gross per 24 hour  Intake 720 ml  Output -  Net 720 ml    Vital Signs: Blood pressure 127/69, pulse 77, temperature 98 F (36.7 C), temperature source Oral, resp. rate 15, height 5\' 5"  (1.651 m), weight 66.1 kg, SpO2 98 %. Physical Exam:  Constitutional: No distress . Vital signs reviewed. HEENT: EOMI, oral membranes moist Neck: supple Cardiovascular: RRR without murmur. No JVD    Respiratory: CTA Bilaterally without wheezes or rales. Normal effort    GI: BS +, non-tender, non-distended   Musculoskeletal: LB TTP. Did not perform any provocative testing.  Neurological: Nocranial nerve deficit.Coordinationnormal.  UE 5/5. LE 1-3/4 and inconsistent with effort. Can bear weight LLE. knee buckles slightly in stance. TR's absent in LE's.---really unchannged  Skin:low back only slightly reddened where she had been rubbing Psychiatric: She has anormal mood and affect. Herbehavior is normal.Judgmentand thought contentnormal.   Assessment/Plan: 1. Functional deficits secondary to lumbar radiculopathy which require 3+ hours per day of interdisciplinary therapy in a comprehensive inpatient rehab setting. Physiatrist is providing close team supervision and 24 hour management of active medical problems listed below. Physiatrist and rehab team continue to assess barriers to discharge/monitor patient progress toward functional and medical goals.  Function:  Bathing Bathing position   Position: Shower  Bathing parts Body parts bathed by patient: Right arm, Left arm, Chest, Abdomen, Front perineal area, Right upper leg, Left upper leg Body parts bathed by helper: Buttocks, Right lower leg, Left lower leg, Back  Bathing assist  Assist Level: (mod a)      Upper Body Dressing/Undressing Upper body dressing   What is the patient wearing?: Pull over shirt/dress, Bra Bra - Perfomed by  patient: Thread/unthread right bra strap, Thread/unthread left bra strap, Hook/unhook bra (pull down sports bra)   Pull over shirt/dress - Perfomed by patient: Thread/unthread right sleeve, Thread/unthread left sleeve, Put head through opening, Pull shirt over trunk          Upper body assist Assist Level: Set up, Supervision or verbal cues   Set up : To obtain clothing/put away  Lower Body Dressing/Undressing Lower body dressing   What is the patient wearing?: Pants, Non-skid slipper socks, Underwear Underwear - Performed by patient: Thread/unthread left underwear leg, Pull underwear up/down, Thread/unthread right underwear leg Underwear - Performed by helper: Thread/unthread right underwear leg Pants- Performed by patient: Thread/unthread left pants leg, Pull pants up/down Pants- Performed by helper: Thread/unthread right pants leg Non-skid slipper socks- Performed by patient: Don/doff left sock, Don/doff right sock Non-skid slipper socks- Performed by helper: Don/doff right sock, Don/doff left sock                  Lower body assist Assist for lower body dressing: Touching or steadying assistance (Pt > 75%)      Toileting Toileting   Toileting steps completed by patient: Adjust clothing prior to toileting, Performs perineal hygiene, Adjust clothing after toileting   Toileting Assistive Devices: Grab bar or rail  Toileting assist Assist level: Touching or steadying assistance (Pt.75%)   Transfers Chair/bed transfer   Chair/bed transfer method: Stand pivot, Ambulatory Chair/bed transfer assist level: Touching or steadying assistance (Pt > 75%) Chair/bed transfer assistive device: Armrests, Walker, Orthosis     Locomotion Ambulation     Max distance: 20 Assist level: Touching or steadying assistance (Pt > 75%)   Wheelchair   Type: Manual Max wheelchair distance: 150 Assist Level: Supervision or verbal cues  Cognition Comprehension Comprehension assist level:  Follows complex conversation/direction with extra time/assistive device  Expression Expression assist level: Expresses complex ideas: With extra time/assistive device  Social Interaction Social Interaction assist level: Interacts appropriately with others with medication or extra time (anti-anxiety, antidepressant).  Problem Solving Problem solving assist level: Solves complex problems: With extra time  Memory Memory assist level: More than reasonable amount of time  Medical Problem List and Plan: 1.Decreased functional mobilitysecondary to acute onchronic lumbar radiculopathy and foraminal stenosis bilaterally with decompression May 2019. Recent fall with exacerbation of pain and motor or sensory deficits.  --received ESI 9/25  -continue therapies with the hope of improved pain post injection 2. DVT Prophylaxis/Anticoagulation: SCDs. 3. Pain Management:Neurontin: titrate to 400mg  tid  -continue Robaxin 500 mg 3 times daily, Ultram 50 mg every 12 hours, oxycodone as needed -consider further titration -OOB 4. Mood:Ativan 0.25-0.5 mg daily as needed 5. Neuropsych: This patientiscapable of making decisions on herown behalf. 6. Skin/Wound Care:Routine skin checks 7. Fluids/Electrolytes/Nutrition:encourage PO .   8.Hypertension. Coreg 6.25 mg twice daily. Monitor with increased mobility 9.Hyperlipidemia. Lipitor 20 mg daily. 10.Constipation. Laxative assistance.   -had bm's yesterday   LOS (Days) [place] EVALUATION WAS PERFORMED  Meredith Staggers, MD 07/23/2018 8:36 AM

## 2018-07-24 ENCOUNTER — Inpatient Hospital Stay (HOSPITAL_COMMUNITY): Payer: Self-pay | Admitting: Occupational Therapy

## 2018-07-24 ENCOUNTER — Inpatient Hospital Stay (HOSPITAL_COMMUNITY): Payer: Self-pay | Admitting: Physical Therapy

## 2018-07-24 NOTE — Progress Notes (Signed)
Occupational Therapy Session Note  Patient Details  Name: [name] MRN: [id number] Date of Birth: 1961-07-24  Today's Date: 07/24/2018 OT Individual Time: 4944-9675 and 1100-1200 OT Individual Time Calculation (min): 74 min and 60 min    Short Term Goals: Week 1:  OT Short Term Goal 1 (Week 1): STGs=LTGs secondary to short estimated LOS  Skilled Therapeutic Interventions/Progress Updates:    Session 1: Upon entering the room, pt supine in bed with 7/10 c/o pain in lower back but reports receiving pain medication prior to OT arrival. Pt very tearful regarding increased pain and current hospital situation. Pt stating, " I just don't know if I will ever feel like myself again."  OT providing therapeutic use of self and encouraged pt based on progress already made with focus on week ahead. Pt reports feeling better and requested to shower. OT covered IV and pt ambulating to bathroom for toileting needs with min A and use of RW. Steady assistance for clothing management and hygiene. Pt transferred into shower with min A and use of RW and grab bar. Bathing performed from seated position on TTB with use of LH sponge and figure four position to increase independence. Pt dressing from seated position on EOB with same techniques to manage back precautions. Pt standing with min A for balance during LB clothing management. Pt transferred into recliner chair with k pad applied for pain management. Call bell and all needed items within reach upon exiting the room.   Session 2: Upon entering the room, pt seated in recliner chair awaiting OT. Pt donning pull over shoes and AFO with set up A and use of figure four position to don.  Skilled OT session with focus on community mobility tasks. Pt reports shopping is one of her favorite things to do with friends. OT assisted pt via wheelchair to gift ship for energy conservation. Pt performed sit >stand with min A and use of RW. Pt ambulating around gift shop  125' with min A for safety and balance. Pt able to navigate tight spaces and aisle while picking up items and looking at them. Pt exited store for seated rest break secondary to increased time. Pt returning to wheelchair and OT assisted back tor room. Pt transferred into bed with min A stand pivot transfer. Sit >supine with mod A. Call bell and all needed items within reach upon exiting the room.   Therapy Documentation Precautions:  Precautions Precautions: Back Precaution Comments: reviewed 3/3 back precautions Required Braces or Orthoses: Spinal Brace Spinal Brace: Lumbar corset Spinal Brace Comments: lumbar brace from home, applied in sitting Restrictions Weight Bearing Restrictions: No Pain: Pain Assessment Pain Scale: 0-10 Pain Score: 8  Pain Location: Back Pain Orientation: Lower;Medial Pain Descriptors / Indicators: Throbbing  See Function Navigator for Current Functional Status.   Therapy/Group: Individual Therapy  [name] 07/24/2018, 8:45 AM

## 2018-07-24 NOTE — Progress Notes (Signed)
Physical Therapy Session Note  Patient Details  Name: [name] MRN: [id number] Date of Birth: 29-Dec-1960  Today's Date: 07/24/2018 PT Individual Time: 1300-1400 PT Individual Time Calculation (min): 60 min   Short Term Goals: Week 1:  PT Short Term Goal 1 (Week 1): =LTGs due to ELOS  Skilled Therapeutic Interventions/Progress Updates:   Pt in supine and agreeable to therapy, pain as detailed below. Session focused on overall activity tolerance, endurance training, and RLE muscle activation. Pt transferred to EOB w/ supervision and donned lumbar brace and shoes/RAFO w/ supervision. Requesting to toilet, ambulated to/from toilet w/ min guard. Pt performed LE garment management and pericare w/ supervision. Pt self-propelled w/c to/from therapy gym at modified independent level to work on overall endurance. Worked on LE strengthening and NMR in gym. Performed 2" step taps w/ RLE to work on hip flexion activation, 2x20 reps w/ emphasis on foot clearance of step and no dragging. Performed partial knee bends in standing w/o UE support, 2x5 reps to work on eccentric quad control. Ended session w/ NuStep, 10 min @ level 3 for LE strengthening and aerobic endurance. Returned to room via w/c, ended session in recliner, call bell within reach and all needs met.   Therapy Documentation Precautions:  Precautions Precautions: Back Precaution Comments: reviewed 3/3 back precautions Required Braces or Orthoses: Spinal Brace Spinal Brace: Lumbar corset Spinal Brace Comments: lumbar brace from home, applied in sitting Restrictions Weight Bearing Restrictions: No Pain: Pain Assessment Pain Scale: 0-10 Pain Score: 6  Pain Type: Acute pain;Chronic pain Pain Location: Back Pain Orientation: Medial;Lower Pain Onset: On-going Pain Intervention(s): Rest  See Function Navigator for Current Functional Status.   Therapy/Group: Individual Therapy  Laken Rog Clent Demark 07/24/2018, 2:23 PM

## 2018-07-24 NOTE — Progress Notes (Signed)
[facility] PHYSICAL MEDICINE & REHABILITATION     PROGRESS NOTE    Subjective/Complaints: Overall had a good day yesterday.  Left leg still feels weak.  Did move bowels the evening before but did not have bowel movements yesterday.  Felt that she had some cramping and then fullness afterwards.  Concerned that she did move her bowels any further  ROS: Patient denies fever, rash, sore throat, blurred vision, nausea, vomiting, diarrhea, cough, shortness of breath or chest pain, joint or back pain, headache, or mood change.    Objective:  No results found. No results for input(s): WBC, HGB, HCT, PLT in the last 72 hours. No results for input(s): NA, K, CL, GLUCOSE, BUN, CREATININE, CALCIUM in the last 72 hours.  Invalid input(s): CO CBG (last 3)  No results for input(s): GLUCAP in the last 72 hours.  Wt Readings from Last 3 Encounters:  07/21/18 66.1 kg  07/12/18 66.7 kg  07/05/18 68 kg     Intake/Output Summary (Last 24 hours) at 07/24/2018 0806 Last data filed at 07/23/2018 1142 Gross per 24 hour  Intake 240 ml  Output -  Net 240 ml    Vital Signs: Blood pressure (!) 145/93, pulse 82, temperature 98 F (36.7 C), temperature source Oral, resp. rate 18, height 5\' 5"  (1.651 m), weight 66.1 kg, SpO2 98 %. Physical Exam:  Constitutional: No distress . Vital signs reviewed. HEENT: EOMI, oral membranes moist Neck: supple Cardiovascular: RRR without murmur. No JVD    Respiratory: CTA Bilaterally without wheezes or rales. Normal effort    GI: BS +, non-tender, non-distended   Musculoskeletal: LB TTP.    Neurological: Nocranial nerve deficit.Coordinationnormal.  UE 5/5. LE 2- to 3/5. Can bear weight LLE.  TR's absent in LE's.---really unchannged  Skin:low back only slightly reddened where she had been rubbing Psychiatric: She has anormal mood and affect. Herbehavior is normal.Judgmentand thought contentnormal.   Assessment/Plan: 1. Functional deficits secondary  to lumbar radiculopathy which require 3+ hours per day of interdisciplinary therapy in a comprehensive inpatient rehab setting. Physiatrist is providing close team supervision and 24 hour management of active medical problems listed below. Physiatrist and rehab team continue to assess barriers to discharge/monitor patient progress toward functional and medical goals.  Function:  Bathing Bathing position   Position: Shower  Bathing parts Body parts bathed by patient: Right arm, Left arm, Chest, Abdomen, Front perineal area, Right upper leg, Left upper leg Body parts bathed by helper: Buttocks, Right lower leg, Left lower leg, Back  Bathing assist Assist Level: (mod a)      Upper Body Dressing/Undressing Upper body dressing   What is the patient wearing?: Pull over shirt/dress, Bra Bra - Perfomed by patient: Thread/unthread right bra strap, Thread/unthread left bra strap, Hook/unhook bra (pull down sports bra)   Pull over shirt/dress - Perfomed by patient: Thread/unthread right sleeve, Thread/unthread left sleeve, Put head through opening, Pull shirt over trunk          Upper body assist Assist Level: Set up, Supervision or verbal cues   Set up : To obtain clothing/put away  Lower Body Dressing/Undressing Lower body dressing   What is the patient wearing?: Pants, Non-skid slipper socks, Underwear Underwear - Performed by patient: Thread/unthread left underwear leg, Pull underwear up/down, Thread/unthread right underwear leg Underwear - Performed by helper: Thread/unthread right underwear leg Pants- Performed by patient: Thread/unthread left pants leg, Pull pants up/down Pants- Performed by helper: Thread/unthread right pants leg Non-skid slipper socks- Performed by patient:  Don/doff left sock, Don/doff right sock Non-skid slipper socks- Performed by helper: Don/doff right sock, Don/doff left sock                  Lower body assist Assist for lower body dressing: Touching or  steadying assistance (Pt > 75%)      Toileting Toileting   Toileting steps completed by patient: Adjust clothing prior to toileting, Performs perineal hygiene, Adjust clothing after toileting   Toileting Assistive Devices: Grab bar or rail  Toileting assist Assist level: Touching or steadying assistance (Pt.75%)   Transfers Chair/bed transfer   Chair/bed transfer method: Stand pivot Chair/bed transfer assist level: Touching or steadying assistance (Pt > 75%) Chair/bed transfer assistive device: Armrests, Walker, Orthosis     Locomotion Ambulation     Max distance: 12' Assist level: Moderate assist (Pt 50 - 74%)   Wheelchair   Type: Manual Max wheelchair distance: 500 Assist Level: No help, No cues, assistive device, takes more than reasonable amount of time  Cognition Comprehension Comprehension assist level: Follows complex conversation/direction with extra time/assistive device  Expression Expression assist level: Expresses complex ideas: With extra time/assistive device  Social Interaction Social Interaction assist level: Interacts appropriately with others with medication or extra time (anti-anxiety, antidepressant).  Problem Solving Problem solving assist level: Solves complex problems: With extra time  Memory Memory assist level: More than reasonable amount of time  Medical Problem List and Plan: 1.Decreased functional mobilitysecondary to acute onchronic lumbar radiculopathy and foraminal stenosis bilaterally with decompression May 2019. Recent fall with exacerbation of pain and motor or sensory deficits.  --received ESI 9/25  -Patient with improved pain after injection 2. DVT Prophylaxis/Anticoagulation: SCDs. 3. Pain Management:Neurontin: titrate to 400mg  tid  -continue Robaxin 500 mg 3 times daily, Ultram 50 mg every 12 hours, oxycodone as needed -consider further titration -OOB 4. Mood:Ativan 0.25-0.5 mg daily as  needed 5. Neuropsych: This patientiscapable of making decisions on herown behalf. 6. Skin/Wound Care:Routine skin checks 7. Fluids/Electrolytes/Nutrition:encourage PO .   8.Hypertension. Coreg 6.25 mg twice daily. Monitor with increased mobility 9.Hyperlipidemia. Lipitor 20 mg daily. 10.Constipation. Laxative assistance.   -Reassured that her bowels are moving.  Would not expect much in the way of a bowel movement today.   LOS (Days) [place] EVALUATION WAS PERFORMED  Meredith Staggers, MD 07/24/2018 8:06 AM

## 2018-07-25 NOTE — Progress Notes (Signed)
[place] PHYSICAL MEDICINE & REHABILITATION     PROGRESS NOTE    Subjective/Complaints: Having anxiety over the fact that she's had a setback. Otherwise doing well  ROS: Patient denies fever, rash, sore throat, blurred vision, nausea, vomiting, diarrhea, cough, shortness of breath or chest pain, joint or back pain, headache, or mood change.   Objective:  No results found. No results for input(s): WBC, HGB, HCT, PLT in the last 72 hours. No results for input(s): NA, K, CL, GLUCOSE, BUN, CREATININE, CALCIUM in the last 72 hours.  Invalid input(s): CO CBG (last 3)  No results for input(s): GLUCAP in the last 72 hours.  Wt Readings from Last 3 Encounters:  07/21/18 66.1 kg  07/12/18 66.7 kg  07/05/18 68 kg     Intake/Output Summary (Last 24 hours) at 07/25/2018 0751 Last data filed at 07/24/2018 1500 Gross per 24 hour  Intake 720 ml  Output -  Net 720 ml    Vital Signs: Blood pressure (!) 149/78, pulse 74, temperature 97.8 F (36.6 C), temperature source Oral, resp. rate 18, height 5\' 5"  (1.651 m), weight 66.1 kg, SpO2 100 %. Physical Exam:  Constitutional: No distress . Vital signs reviewed. HEENT: EOMI, oral membranes moist Neck: supple Cardiovascular: RRR without murmur. No JVD    Respiratory: CTA Bilaterally without wheezes or rales. Normal effort    GI: BS +, non-tender, non-distended  Musculoskeletal: LB TTP.    Neurological: Nocranial nerve deficit.Coordinationnormal.  UE 5/5. LE 2- to 3/5. Can bear weight LLE.  TR's absent in LE's.---really unchannged  Skin:intact Psychiatric: pleasant, baseline anxiety  Assessment/Plan: 1. Functional deficits secondary to lumbar radiculopathy which require 3+ hours per day of interdisciplinary therapy in a comprehensive inpatient rehab setting. Physiatrist is providing close team supervision and 24 hour management of active medical problems listed below. Physiatrist and rehab team continue to assess barriers to  discharge/monitor patient progress toward functional and medical goals.  Function:  Bathing Bathing position   Position: Shower  Bathing parts Body parts bathed by patient: Right arm, Left arm, Chest, Abdomen, Front perineal area, Right upper leg, Left upper leg, Buttocks, Right lower leg, Left lower leg Body parts bathed by helper: Buttocks, Right lower leg, Left lower leg, Back  Bathing assist Assist Level: Supervision or verbal cues      Upper Body Dressing/Undressing Upper body dressing   What is the patient wearing?: Pull over shirt/dress, Bra Bra - Perfomed by patient: Thread/unthread right bra strap, Thread/unthread left bra strap, Hook/unhook bra (pull down sports bra)   Pull over shirt/dress - Perfomed by patient: Thread/unthread right sleeve, Thread/unthread left sleeve, Put head through opening, Pull shirt over trunk          Upper body assist Assist Level: Set up, Supervision or verbal cues   Set up : To obtain clothing/put away  Lower Body Dressing/Undressing Lower body dressing   What is the patient wearing?: Pants, Non-skid slipper socks, Underwear Underwear - Performed by patient: Thread/unthread left underwear leg, Pull underwear up/down, Thread/unthread right underwear leg Underwear - Performed by helper: Thread/unthread right underwear leg Pants- Performed by patient: Thread/unthread left pants leg, Pull pants up/down, Thread/unthread right pants leg Pants- Performed by helper: Thread/unthread right pants leg Non-skid slipper socks- Performed by patient: Don/doff left sock, Don/doff right sock Non-skid slipper socks- Performed by helper: Don/doff right sock, Don/doff left sock                  Lower body assist Assist for lower  body dressing: Touching or steadying assistance (Pt > 75%)      Toileting Toileting   Toileting steps completed by patient: Adjust clothing prior to toileting, Performs perineal hygiene, Adjust clothing after toileting    Toileting Assistive Devices: Grab bar or rail  Toileting assist Assist level: Touching or steadying assistance (Pt.75%)   Transfers Chair/bed transfer   Chair/bed transfer method: Stand pivot Chair/bed transfer assist level: Touching or steadying assistance (Pt > 75%) Chair/bed transfer assistive device: Armrests, Medical sales representative     Max distance: 20' Assist level: Touching or steadying assistance (Pt > 75%)   Wheelchair   Type: Manual Max wheelchair distance: 150' Assist Level: No help, No cues, assistive device, takes more than reasonable amount of time  Cognition Comprehension Comprehension assist level: Follows complex conversation/direction with extra time/assistive device  Expression Expression assist level: Expresses complex ideas: With extra time/assistive device  Social Interaction Social Interaction assist level: Interacts appropriately with others with medication or extra time (anti-anxiety, antidepressant).  Problem Solving Problem solving assist level: Solves complex problems: With extra time  Memory Memory assist level: More than reasonable amount of time  Medical Problem List and Plan: 1.Decreased functional mobilitysecondary to acute onchronic lumbar radiculopathy and foraminal stenosis bilaterally with decompression May 2019. Recent fall with exacerbation of pain and motor or sensory deficits.  --received ESI 9/25  -continue to provide positive reinforcement, discussed safety moving forward 2. DVT Prophylaxis/Anticoagulation: SCDs. 3. Pain Management:Neurontin: titrate to 400mg  tid  -continue Robaxin 500 mg 3 times daily, Ultram 50 mg every 12 hours, oxycodone as needed -consider further titration -OOB 4. Mood:Ativan 0.25-0.5 mg daily as needed 5. Neuropsych: This patientiscapable of making decisions on herown behalf. 6. Skin/Wound Care:Routine skin checks 7.  Fluids/Electrolytes/Nutrition:encourage PO .   8.Hypertension. Coreg 6.25 mg twice daily. Monitor with increased mobility 9.Hyperlipidemia. Lipitor 20 mg daily. 10.Constipation. Laxative assistance.    LOS (Days) [place] EVALUATION WAS PERFORMED  Meredith Staggers, MD 07/25/2018 7:51 AM

## 2018-07-25 NOTE — Progress Notes (Signed)
Occupational Therapy Session Note  Patient Details  Name: [name] MRN: [id number] Date of Birth: Feb 26, 1961  Today's Date: 07/25/2018 OT Group Time: 1100-1200 OT Group Time Calculation (min): 60 min   Skilled Therapeutic Interventions/Progress Updates:    Pt participated in therapeutic w/c level dance group with focus on UE/LE strengthening, activity tolerance, and social participation for carryover during self care tasks. Pt was guided through various dance-based exercises involving UB/LB and trunk. She was actively engaged throughout group, singing songs, interacting with others, and taking minimal rest periods. At the end of group she self propelled back to room.   Therapy Documentation Precautions:  Precautions Precautions: Back Precaution Comments: reviewed 3/3 back precautions Required Braces or Orthoses: Spinal Brace Spinal Brace: Lumbar corset Spinal Brace Comments: lumbar brace from home, applied in sitting Restrictions Weight Bearing Restrictions: No Pain: No c/o pain during tx    ADL:    See Function Navigator for Current Functional Status.   Therapy/Group: Group Therapy  [name] A Woody Kronberg 07/25/2018, 12:34 PM

## 2018-07-26 ENCOUNTER — Inpatient Hospital Stay (HOSPITAL_COMMUNITY): Payer: Self-pay | Admitting: Physical Therapy

## 2018-07-26 ENCOUNTER — Inpatient Hospital Stay (HOSPITAL_COMMUNITY): Payer: Self-pay | Admitting: Occupational Therapy

## 2018-07-26 NOTE — Progress Notes (Signed)
Physical Therapy Session Note  Patient Details  Name: STACYE NOORI MRN: [id number] Date of Birth: [birth year]  Today's Date: 07/26/2018 PT Individual Time: 1300-1415 PT Individual Time Calculation (min): 75 min   Short Term Goals: Week 1:  PT Short Term Goal 1 (Week 1): =LTGs due to ELOS  Skilled Therapeutic Interventions/Progress Updates:    c/o 7/10 pain during session, applied heat and RN notified for pain medication.  Session focus on functional mobility and LE strengthening.    Ambulatory transfer into bathroom with RW, min guard for safety.  Pt completes toilet transfer with supervision and 3/3 toileting steps with set up assist.  Gait training x25' +40' with RW, initially min assist with LLE buckling requiring seated rest break, progress to min guard with education for equal step length and forward gaze.  Stair negotiation x4 steps with 2 rails and overall min assist.   Pt completes 10 reps minisquats with RW, noting increased pain with activity.  Seated rest break with heat applied to back. Nustep x8 minutes at level 4 for strengthening and gentle ROM.  Returned to room at end of session, stand/pivot back to recliner without AD with mod assist.  Positioned with call bell in reach and needs met.   Therapy Documentation Precautions:  Precautions Precautions: Back Precaution Comments: reviewed 3/3 back precautions Required Braces or Orthoses: Spinal Brace Spinal Brace: Lumbar corset Spinal Brace Comments: lumbar brace from home, applied in sitting Restrictions Weight Bearing Restrictions: No   See Function Navigator for Current Functional Status.   Therapy/Group: Individual Therapy  Michel Santee 07/26/2018, 2:18 PM

## 2018-07-26 NOTE — Progress Notes (Signed)
[place] PHYSICAL MEDICINE & REHABILITATION     PROGRESS NOTE    Subjective/Complaints: Restless night. Back bothered her. Slept in chair.  ROS: Patient denies fever, rash, sore throat, blurred vision, nausea, vomiting, diarrhea, cough, shortness of breath or chest pain, joint or back pain, headache, or mood change.     Objective:  No results found. No results for input(s): WBC, HGB, HCT, PLT in the last 72 hours. No results for input(s): NA, K, CL, GLUCOSE, BUN, CREATININE, CALCIUM in the last 72 hours.  Invalid input(s): CO CBG (last 3)  No results for input(s): GLUCAP in the last 72 hours.  Wt Readings from Last 3 Encounters:  07/21/18 66.1 kg  07/12/18 66.7 kg  07/05/18 68 kg     Intake/Output Summary (Last 24 hours) at 07/26/2018 0837 Last data filed at 07/26/2018 0745 Gross per 24 hour  Intake 960 ml  Output -  Net 960 ml    Vital Signs: Blood pressure 137/66, pulse 68, temperature 98 F (36.7 C), temperature source Oral, resp. rate 18, height 5\' 5"  (1.651 m), weight 66.1 kg, SpO2 97 %. Physical Exam:  Constitutional: No distress . Vital signs reviewed. HEENT: EOMI, oral membranes moist Neck: supple Cardiovascular: RRR without murmur. No JVD    Respiratory: CTA Bilaterally without wheezes or rales. Normal effort    GI: BS +, non-tender, non-distended  Musculoskeletal: LB TTP.    Neurological: Nocranial nerve deficit.Coordinationnormal.  UE 5/5. LE 2- to 3/5. Knee ext 3/5, HAD 3-4, HAB 3-4, ADF/PF ?1/5---effort issues Skin:intact Psychiatric: pleasant, anxious  Assessment/Plan: 1. Functional deficits secondary to lumbar radiculopathy which require 3+ hours per day of interdisciplinary therapy in a comprehensive inpatient rehab setting. Physiatrist is providing close team supervision and 24 hour management of active medical problems listed below. Physiatrist and rehab team continue to assess barriers to discharge/monitor patient progress toward  functional and medical goals.  Function:  Bathing Bathing position   Position: Shower  Bathing parts Body parts bathed by patient: Right arm, Left arm, Chest, Abdomen, Front perineal area, Right upper leg, Left upper leg, Buttocks, Right lower leg, Left lower leg Body parts bathed by helper: Buttocks, Right lower leg, Left lower leg, Back  Bathing assist Assist Level: Supervision or verbal cues      Upper Body Dressing/Undressing Upper body dressing   What is the patient wearing?: Pull over shirt/dress, Bra Bra - Perfomed by patient: Thread/unthread right bra strap, Thread/unthread left bra strap, Hook/unhook bra (pull down sports bra)   Pull over shirt/dress - Perfomed by patient: Thread/unthread right sleeve, Thread/unthread left sleeve, Put head through opening, Pull shirt over trunk          Upper body assist Assist Level: Set up, Supervision or verbal cues   Set up : To obtain clothing/put away  Lower Body Dressing/Undressing Lower body dressing   What is the patient wearing?: Pants, Non-skid slipper socks, Underwear Underwear - Performed by patient: Thread/unthread left underwear leg, Pull underwear up/down, Thread/unthread right underwear leg Underwear - Performed by helper: Thread/unthread right underwear leg Pants- Performed by patient: Thread/unthread left pants leg, Pull pants up/down, Thread/unthread right pants leg Pants- Performed by helper: Thread/unthread right pants leg Non-skid slipper socks- Performed by patient: Don/doff left sock, Don/doff right sock Non-skid slipper socks- Performed by helper: Don/doff right sock, Don/doff left sock                  Lower body assist Assist for lower body dressing: Touching or steadying assistance (  Pt > 75%)      Toileting Toileting   Toileting steps completed by patient: Adjust clothing prior to toileting, Performs perineal hygiene, Adjust clothing after toileting   Toileting Assistive Devices: Grab bar or rail   Toileting assist Assist level: Touching or steadying assistance (Pt.75%)   Transfers Chair/bed transfer   Chair/bed transfer method: Stand pivot Chair/bed transfer assist level: Touching or steadying assistance (Pt > 75%) Chair/bed transfer assistive device: Armrests, Medical sales representative     Max distance: 20' Assist level: Touching or steadying assistance (Pt > 75%)   Wheelchair   Type: Manual Max wheelchair distance: 150' Assist Level: No help, No cues, assistive device, takes more than reasonable amount of time  Cognition Comprehension Comprehension assist level: Follows complex conversation/direction with extra time/assistive device  Expression Expression assist level: Expresses complex ideas: With extra time/assistive device  Social Interaction Social Interaction assist level: Interacts appropriately with others with medication or extra time (anti-anxiety, antidepressant).  Problem Solving Problem solving assist level: Solves complex problems: With extra time  Memory Memory assist level: More than reasonable amount of time  Medical Problem List and Plan: 1.Decreased functional mobilitysecondary to acute onchronic lumbar radiculopathy and foraminal stenosis bilaterally with decompression May 2019. Recent fall with exacerbation of pain and motor or sensory deficits.  --received ESI 9/25  -daily education an dpositive reinforcement by team 2. DVT Prophylaxis/Anticoagulation: SCDs. 3. Pain Management:Neurontin: titrate to 400mg  tid  -continue Robaxin 500 mg 3 times daily, Ultram 50 mg every 12 hours, oxycodone as needed -consider further titration -OOB 4. Mood:Ativan 0.25-0.5 mg daily as needed 5. Neuropsych: This patientiscapable of making decisions on herown behalf. 6. Skin/Wound Care:Routine skin checks 7. Fluids/Electrolytes/Nutrition:encourage PO .   8.Hypertension. Coreg 6.25 mg twice daily. Monitor with  increased mobility 9.Hyperlipidemia. Lipitor 20 mg daily. 10.Constipation. Laxative assistance.    LOS (Days) [place] EVALUATION WAS PERFORMED  Meredith Staggers, MD 07/26/2018 8:37 AM

## 2018-07-26 NOTE — Progress Notes (Signed)
Occupational Therapy Session Note  Patient Details  Name: [name] MRN: [id number] Date of Birth: November 10, 1960  Today's Date: 07/26/2018 OT Individual Time: 1005-1101 OT Individual Time Calculation (min): 56 min   Short Term Goals: Week 1:  OT Short Term Goal 1 (Week 1): STGs=LTGs secondary to short estimated LOS  Skilled Therapeutic Interventions/Progress Updates:    Pt greeted in w/c and premedicated for pain. Started session with ambulatory transfer to toilet with steady assist and RW. Toileting tasks completed with steady assist. After handwashing, she returned to w/c. She donned back brace with setup. Took her to dayroom to focus on dynamic balance and LE strengthening via IADL participation. Pt side-stepped and ambulated around all tables in dayroom to wash them using RW with steady assist. She required cues for taking "silent steps" due to B foot drag. Also required cues for forward gaze. Next, increased balance challenges by having her complete a B involved UE task (sweeping floor) with broom and LH dustpan. OT setup piles for her to sweep. Pt able to ambulate short distance carrying broom with steady assist. Had her complete sit<stands from armless chair during rest breaks, which she did with steady assist and min vcs. At end of session she was escorted back to room and transferred to her recliner using RW. Pt left with all needs within reach at session exit.   Therapy Documentation Precautions:  Precautions Precautions: Back Precaution Comments: reviewed 3/3 back precautions Required Braces or Orthoses: Spinal Brace Spinal Brace: Lumbar corset Spinal Brace Comments: lumbar brace from home, applied in sitting Restrictions Weight Bearing Restrictions: No Vital Signs: Therapy Vitals Pulse Rate: 86 Resp: 18 BP: (!) 151/87 Patient Position (if appropriate): Sitting Oxygen Therapy SpO2: 100 % O2 Device: Room Air Pain: in back. Premedicated, per report.  Pain  Assessment Pain Scale: 0-10 Pain Score: 5  Pain Type: Neuropathic pain;Chronic pain Pain Location: Back Pain Orientation: Lower Pain Descriptors / Indicators: Aching;Constant Pain Frequency: Constant Pain Onset: On-going Pain Intervention(s): Rest Multiple Pain Sites: No ADL:      See Function Navigator for Current Functional Status.   Therapy/Group: Individual Therapy  Kyria Bumgardner A Lukis Bunt 07/26/2018, 12:30 PM

## 2018-07-26 NOTE — Progress Notes (Signed)
Physical Therapy Session Note  Patient Details  Name: [name] MRN: [id number] Date of Birth: [birth year]  Today's Date: 07/26/2018 PT Individual Time: 1130-1200 PT Individual Time Calculation (min): 30 min   Short Term Goals: Week 1:  PT Short Term Goal 1 (Week 1): =LTGs due to ELOS  Skilled Therapeutic Interventions/Progress Updates: Pt received in recliner, denies pain and agreeable to treatment. Stand pivot recliner>w/c with RW and close S. Transported totalA to gym for energy conservation. Gait x53' with RW and min guard, slow speed. Use of error augmentation via ankle weights for gait trial x30' with RW and min guard; gait trial following use of weights demos slight increase in gait speed and pt perceived difficulty. Side stepping R/L x15' each with min guard for focus on glute med strengthening, LE coordination with cues to increase foot clearance during stepping. Returned to room totalA. Stand pivot to recliner with RW and S. Remained in recliner, all needs in reach at completion of session.      Therapy Documentation Precautions:  Precautions Precautions: Back Precaution Comments: reviewed 3/3 back precautions Required Braces or Orthoses: Spinal Brace Spinal Brace: Lumbar corset Spinal Brace Comments: lumbar brace from home, applied in sitting Restrictions Weight Bearing Restrictions: No   See Function Navigator for Current Functional Status.   Therapy/Group: Individual Therapy  Corliss Skains 07/26/2018, 12:01 PM

## 2018-07-26 NOTE — Progress Notes (Signed)
Physical Therapy Session Note  Patient Details  Name: [name] MRN: [id number] Date of Birth: 12-15-1960  Today's Date: 07/26/2018 PT Individual Time: 0900-0930 PT Individual Time Calculation (min): 30 min   Short Term Goals: Week 1:  PT Short Term Goal 1 (Week 1): =LTGs due to ELOS  Skilled Therapeutic Interventions/Progress Updates:    no c/o pain, reports feeling a little stiff.  Session focus on activity tolerance, strengthening, and functional mobility.    Pt transfers throughout session with RW and close supervision/min guard.  Gait training x25' with min guard, 1 episode of L knee buckle requiring mod assist to stabilize.  Pt continues to demo slow gait pattern with decreased step length on R, and decreased ability to weight shift over advanced LLE.  Cues for more even step length.  BLE therex with 3# weights for strengthening/endurance, 2x12 reps LAQ and hip flexion.  Pt returned to room at end of session and positioned in recliner with call bell in reach and needs met.   Therapy Documentation Precautions:  Precautions Precautions: Back Precaution Comments: reviewed 3/3 back precautions Required Braces or Orthoses: Spinal Brace Spinal Brace: Lumbar corset Spinal Brace Comments: lumbar brace from home, applied in sitting Restrictions Weight Bearing Restrictions: No   See Function Navigator for Current Functional Status.   Therapy/Group: Individual Therapy  Michel Santee 07/26/2018, 10:02 AM

## 2018-07-26 NOTE — Progress Notes (Signed)
Held scheduled ultram, patient wanted PRN instead. 2 PRN percocet given at 2040 and 0058 for back and LLE pain. [name] A

## 2018-07-27 ENCOUNTER — Inpatient Hospital Stay (HOSPITAL_COMMUNITY): Payer: Self-pay | Admitting: Occupational Therapy

## 2018-07-27 ENCOUNTER — Inpatient Hospital Stay (HOSPITAL_COMMUNITY): Payer: Self-pay | Admitting: Physical Therapy

## 2018-07-27 NOTE — Progress Notes (Signed)
Occupational Therapy Session Note  Patient Details  Name: [name] MRN: [id number] Date of Birth: 07/06/1961  Today's Date: 07/27/2018 OT Individual Time: 2761-4709 and 1300-1400 OT Individual Time Calculation (min): 72 min and 60 min    Short Term Goals: Week 1:  OT Short Term Goal 1 (Week 1): STGs=LTGs secondary to short estimated LOS  Skilled Therapeutic Interventions/Progress Updates:    Session 1: Upon entering the room, pt supine in bed with no c/o pain. Pt agreeable to OT intervention and requesting to shower this session. Pt donning all clothing items while seated and ambulating into bathroom with steady assistance and use of RW. Pt transferred onto TTB and bathing from seated level. Lateral leans and use of LH bath sponge to increase independence with LB self care. Pt returning to sit on EOB with focus on figure four position to don LB clothing items with steady assistance for balance only in standing. Pt transferred to wheelchair at sink for grooming tasks secondary to increased pain this session. Pt transferred into recliner chair at end of session with call bell and all needed items within reach. K pad placed on lower back for pain management.   Session 2: Upon entering the room, pt with no c/o pain. Pt verbalized, " I need to go to the bathroom." Pt standing from recliner chair with close supervision and ambulating into bathroom with steady assistance and use of RW. Pt having BM and able to perform clothing management and hygiene with close supervision and min cuing for back precautions. Pt standing at sink for hand hygiene with close supervision. Pt sitting in wheelchair and assisted outside for energy conservation. Pt ambulating 30 feet outside before R LE showing increased weakness and possible buckling. Pt becoming very anxious and assisted sitting down. OT provided encouragement and therapeutic use of self to calm pt. Stand pivot transfer into wheelchair and pt assisted  back to room via wheelchair. Pt transferred with min A stand pivot transfer and use of RW into recliner chair. K pad placed on lower back. Call bell and all needed items within reach upon exiting the room.   Therapy Documentation Precautions:  Precautions Precautions: Back Precaution Comments: reviewed 3/3 back precautions Required Braces or Orthoses: Spinal Brace Spinal Brace: Lumbar corset Spinal Brace Comments: lumbar brace from home, applied in sitting Restrictions Weight Bearing Restrictions: No   Therapy/Group: Individual Therapy  [name] 07/27/2018, 12:19 PM

## 2018-07-27 NOTE — Progress Notes (Signed)
[facility] PHYSICAL MEDICINE & REHABILITATION     PROGRESS NOTE    Subjective/Complaints: Up with OT. No new issues this morning  ROS: Patient denies fever, rash, sore throat, blurred vision, nausea, vomiting, diarrhea, cough, shortness of breath or chest pain, joint or back pain, headache, or mood change.    Objective:  No results found. No results for input(s): WBC, HGB, HCT, PLT in the last 72 hours. No results for input(s): NA, K, CL, GLUCOSE, BUN, CREATININE, CALCIUM in the last 72 hours.  Invalid input(s): CO CBG (last 3)  No results for input(s): GLUCAP in the last 72 hours.  Wt Readings from Last 3 Encounters:  07/21/18 66.1 kg  07/12/18 66.7 kg  07/05/18 68 kg     Intake/Output Summary (Last 24 hours) at 07/27/2018 0829 Last data filed at 07/26/2018 2014 Gross per 24 hour  Intake 360 ml  Output 2 ml  Net 358 ml    Vital Signs: Blood pressure (!) 142/91, pulse 76, temperature 98.2 F (36.8 C), temperature source Oral, resp. rate 12, height 5\' 5"  (1.651 m), weight 66.1 kg, SpO2 100 %. Physical Exam:  Constitutional: No distress . Vital signs reviewed. HEENT: EOMI, oral membranes moist Neck: supple Cardiovascular: RRR without murmur. No JVD    Respiratory: CTA Bilaterally without wheezes or rales. Normal effort    GI: BS +, non-tender, non-distended  Musculoskeletal: LB TTP.    Neurological: Nocranial nerve deficit.Coordinationnormal.  UE 5/5. LE 2- to 3/5. Knee ext 3/5, HAD 3-4, HAB 3-4, ADF/PF ?1/5---effort issues Skin:intact Psychiatric: pleasant, anxious  Assessment/Plan: 1. Functional deficits secondary to lumbar radiculopathy which require 3+ hours per day of interdisciplinary therapy in a comprehensive inpatient rehab setting. Physiatrist is providing close team supervision and 24 hour management of active medical problems listed below. Physiatrist and rehab team continue to assess barriers to discharge/monitor patient progress toward  functional and medical goals.  Function:  Bathing Bathing position   Position: Shower  Bathing parts Body parts bathed by patient: Right arm, Left arm, Chest, Abdomen, Front perineal area, Right upper leg, Left upper leg, Buttocks, Right lower leg, Left lower leg Body parts bathed by helper: Buttocks, Right lower leg, Left lower leg, Back  Bathing assist Assist Level: Supervision or verbal cues      Upper Body Dressing/Undressing Upper body dressing   What is the patient wearing?: Pull over shirt/dress, Bra Bra - Perfomed by patient: Thread/unthread right bra strap, Thread/unthread left bra strap, Hook/unhook bra (pull down sports bra)   Pull over shirt/dress - Perfomed by patient: Thread/unthread right sleeve, Thread/unthread left sleeve, Put head through opening, Pull shirt over trunk          Upper body assist Assist Level: Set up, Supervision or verbal cues   Set up : To obtain clothing/put away  Lower Body Dressing/Undressing Lower body dressing   What is the patient wearing?: Pants, Non-skid slipper socks, Underwear Underwear - Performed by patient: Thread/unthread left underwear leg, Pull underwear up/down, Thread/unthread right underwear leg Underwear - Performed by helper: Thread/unthread right underwear leg Pants- Performed by patient: Thread/unthread left pants leg, Pull pants up/down, Thread/unthread right pants leg Pants- Performed by helper: Thread/unthread right pants leg Non-skid slipper socks- Performed by patient: Don/doff left sock, Don/doff right sock Non-skid slipper socks- Performed by helper: Don/doff right sock, Don/doff left sock                  Lower body assist Assist for lower body dressing: Touching or steadying  assistance (Pt > 75%)      Toileting Toileting   Toileting steps completed by patient: Adjust clothing prior to toileting, Performs perineal hygiene, Adjust clothing after toileting   Toileting Assistive Devices: Grab bar or rail   Toileting assist Assist level: Touching or steadying assistance (Pt.75%)   Transfers Chair/bed transfer   Chair/bed transfer method: Ambulatory Chair/bed transfer assist level: Touching or steadying assistance (Pt > 75%) Chair/bed transfer assistive device: Walker, Orthosis, Armrests     Locomotion Ambulation     Max distance: 40 Assist level: Touching or steadying assistance (Pt > 75%)   Wheelchair   Type: Manual Max wheelchair distance: 150' Assist Level: No help, No cues, assistive device, takes more than reasonable amount of time  Cognition Comprehension Comprehension assist level: Follows complex conversation/direction with extra time/assistive device  Expression Expression assist level: Expresses complex ideas: With extra time/assistive device  Social Interaction Social Interaction assist level: Interacts appropriately with others with medication or extra time (anti-anxiety, antidepressant).  Problem Solving Problem solving assist level: Solves complex problems: With extra time  Memory Memory assist level: More than reasonable amount of time  Medical Problem List and Plan: 1.Decreased functional mobilitysecondary to acute onchronic lumbar radiculopathy and foraminal stenosis bilaterally with decompression May 2019. Recent fall with exacerbation of pain and motor or sensory deficits.  --received ESI 9/25  -daily education an dpositive reinforcement by team  -team conference 2. DVT Prophylaxis/Anticoagulation: SCDs. 3. Pain Management:Neurontin: titrated to 400mg  tid  -continue Robaxin 500 mg 3 times daily, Ultram 50 mg every 12 hours, oxycodone as needed -OOB 4. Mood:Ativan 0.25-0.5 mg daily as needed 5. Neuropsych: This patientiscapable of making decisions on herown behalf. 6. Skin/Wound Care:Routine skin checks 7. Fluids/Electrolytes/Nutrition:encourage PO .   8.Hypertension. Coreg 6.25 mg twice daily. Monitor with  increased mobility 9.Hyperlipidemia. Lipitor 20 mg daily. 10.Constipation. Laxative assistance.    LOS (Days) [place] EVALUATION WAS PERFORMED  Meredith Staggers, MD 07/27/2018 8:29 AM

## 2018-07-27 NOTE — Progress Notes (Signed)
Physical Therapy Session Note  Patient Details  Name: [name] MRN: [id number] Date of Birth: 07/27/1961  Today's Date: 07/27/2018 PT Individual Time: 1000-1100 PT Individual Time Calculation (min): 60 min   Short Term Goals: Week 1:  PT Short Term Goal 1 (Week 1): =LTGs due to ELOS  Skilled Therapeutic Interventions/Progress Updates:    c/o pain 6/10, declines intervention.  Session focus on activity tolerance and NMR via ambulation and LE therex.    Pt transfers sit<>stand and stand/pivot throughout session with RW and supervision, ambulatory transfers with min guard.  Gait in/out of bathroom with close supervision with RW and pt completes 3/3 toileting steps with set up assist.  Gait training with RW 3x40' with supervision<>min assist, with min/mod cues for increased foot clearance and heel strike bilaterally.  Pt appears to have more controlled gait pattern when within 10' of target.  LE therex 2x10 reps with 3# ankle weights hip flexion from propped on hands, ball squeezes, and hip abduction with level 3 theraband.  Side stepping x6 reps L/R over yoga block for increased hip flexion and hip abductor strengthening for carryover into gait.  Pt returned to room in w/c as above, positioned in recliner with supervision for stand/pivot using RW.  Call bell in reach and needs met.  Therapy Documentation Precautions:  Precautions Precautions: Back Precaution Comments: reviewed 3/3 back precautions Required Braces or Orthoses: Spinal Brace Spinal Brace: Lumbar corset Spinal Brace Comments: lumbar brace from home, applied in sitting Restrictions Weight Bearing Restrictions: No    Therapy/Group: Individual Therapy  Michel Santee 07/27/2018, 10:59 AM

## 2018-07-28 ENCOUNTER — Inpatient Hospital Stay (HOSPITAL_COMMUNITY): Payer: Self-pay | Admitting: Occupational Therapy

## 2018-07-28 ENCOUNTER — Inpatient Hospital Stay (HOSPITAL_COMMUNITY): Payer: Self-pay | Admitting: Physical Therapy

## 2018-07-28 MED ORDER — HYOSCYAMINE SULFATE 0.125 MG PO TBDP: 0.1250 mg | ORAL_TABLET | Freq: Four times a day (QID) | ORAL | Status: DC | PRN

## 2018-07-28 MED ORDER — HYOSCYAMINE SULFATE 0.125 MG SL SUBL
0.1250 mg | SUBLINGUAL_TABLET | Freq: Four times a day (QID) | SUBLINGUAL | Status: DC | PRN
  Administered 2018-07-28 – 2018-08-01 (×3): 0.125 mg via SUBLINGUAL
  Filled 2018-07-28 (×4): qty 1

## 2018-07-28 MED ORDER — HYOSCYAMINE SULFATE 0.125 MG PO TABS
0.1250 mg | ORAL_TABLET | Freq: Four times a day (QID) | ORAL | Status: DC | PRN
  Filled 2018-07-28 (×6): qty 1

## 2018-07-28 NOTE — Progress Notes (Signed)
Physical Therapy Session Note  Patient Details  Name: [name] MRN: [id number] Date of Birth: 1961-08-26  Today's Date: 07/28/2018 PT Individual Time: 0900-1000 PT Individual Time Calculation (min): 60 min   Short Term Goals: Week 1:  PT Short Term Goal 1 (Week 1): =LTGs due to ELOS  Skilled Therapeutic Interventions/Progress Updates:    Pt received seated in recliner in room, agreeable to PT. Pt reports some cramping pain in her L lower abdomen that is moving posteriorly, RN and medical team aware and are addressing pt's pain. Sit to stand with Supervision to CGA throughout therapy session. Ambulation 2 x 40 ft with RW and R AFO, min A for balance due to L knee buckling with gait especially with onset of fatigue. Pt appears anxious and reports having a fear of falling since her fall at work that brought her back to rehab. Pt has one instance during gait where she requires min A to advance LLE due to "freezing up" and is unable to advance LE on her own. Standing 3" step-taps fwd and lateral x 10 reps each with focus on stance on LLE with BUE support and min A. Mini-squats x 10 reps with min A and BUE support. Side-steps 2 x 10 ft L/R with BUE support and min A for balance with focus on BLE control and glute activation. Pt left seated in w/c in room at end of therapy session, needs in reach, husband present.  Therapy Documentation Precautions:  Precautions Precautions: Back Precaution Comments: reviewed 3/3 back precautions Required Braces or Orthoses: Spinal Brace Spinal Brace: Lumbar corset Spinal Brace Comments: lumbar brace from home, applied in sitting Restrictions Weight Bearing Restrictions: No   Therapy/Group: Individual Therapy  Excell Seltzer, PT, DPT  07/28/2018, 9:59 AM

## 2018-07-28 NOTE — Patient Care Conference (Addendum)
Inpatient RehabilitationTeam Conference and Plan of Care Update Date: 07/27/2018   Time: 2:00 PM    Patient Name: [name]      Medical Record Number: [id number]  Date of Birth: 09-12-1961 Sex: Female         Room/Bed: 4W04C/4W04C-01 Payor Info: Payor: [employer] [place] [employer] / Plan: [employer] / Product Type: *No Product type* /    Admitting Diagnosis: Lumbar Radiculopathy  Admit Date/Time:  07/20/2018  5:25 PM Admission Comments: No comment available   Primary Diagnosis:  <principal problem not specified> Principal Problem: <principal problem not specified>  Patient Active Problem List   Diagnosis Date Noted  . Gait disorder   . Difficulty in urination 07/13/2018  . Back pain 07/12/2018  . Hyperlipidemia 04/16/2018  . Generalized anxiety disorder with panic attacks 04/16/2018  . AKI (acute kidney injury) ([place])   . Benign essential HTN   . Depression with anxiety   . Lumbar radiculopathy 03/09/2018  . Paraparesis ([place]) 03/09/2018  . Essential hypertension   . Chronic nonintractable headache   . Leukocytosis   . Acute blood loss anemia   . Postoperative pain   . Generalized weakness   . Spinal stenosis at L4-L5 level 03/04/2018  . Spinal stenosis of lumbar region 12/28/2015  . Chest pain 04/16/2015    Expected Discharge Date: Expected Discharge Date: 08/03/18  Team Members Present: Physician leading conference: Dr. Alger Simons Social Worker Present: Lennart Pall, LCSW Nurse Present: Frances Maywood, RN PT Present: Dwyane Dee, PT OT Present: Benay Pillow, OT PPS Coordinator present : Daiva Nakayama, RN, CRRN     Current Status/Progress Goal Weekly Team Focus  Medical   lumbar radiculopathy with exacerbation after fall, s/p ESI. ongoing weakness and pain but improving. behavioral component  improved balance and safety with mobility  gait, pain, ego support   Bowel/Bladder   Continent of bowel and bladder; LBM 07/26/18  Remain continent  Assess  bowel and bladder needs q shift and PRN   Swallow/Nutrition/ Hydration             ADL's   supervision - steady assistance overall  mod I overall with supervision for shower transfer  strengthening, balance, self care, pt/family education   Mobility   supervision for transfers, gait up to 50' with min guard/assist and RW  supervision/mod I  activity tolerance, gait training, strengthening/endurance   Communication             Safety/Cognition/ Behavioral Observations            Pain   C/o pain 6/10 in back, treated with scheduled and PRN pain meds  Pain </= 3/10  Assess pain q shift and PRN and medicate as needed   Skin   No skin issues   Remain free of infection and breakdown  Assess skin q shift and PRN    Rehab Goals Patient on target to meet rehab goals: Yes *See Care Plan and progress notes for long and short-term goals.     Barriers to Discharge  Current Status/Progress Possible Resolutions Date Resolved   Physician    Medical stability        see medical progress notes      Nursing                  PT                    OT  SLP                SW                Discharge Planning/Teaching Needs:  Home with family who can provide intermittent support.  NA - mod ind goals   Team Discussion:  Pt making steady progress toward mod ind goals.  Some anxiety that can be limiting at times.  No significant medical issues.  Currently supervision to min-gurad assistance.  Revisions to Treatment Plan:  NA    Continued Need for Acute Rehabilitation Level of Care: The patient requires daily medical management by a physician with specialized training in physical medicine and rehabilitation for the following conditions: Daily direction of a multidisciplinary physical rehabilitation program to ensure safe treatment while eliciting the highest outcome that is of practical value to the patient.: Yes Daily medical management of patient stability for increased  activity during participation in an intensive rehabilitation regime.: Yes Daily analysis of laboratory values and/or radiology reports with any subsequent need for medication adjustment of medical intervention for : Other;Neurological problems   I attest that I was present, lead the team conference, and concur with the assessment and plan of the team.   Gregroy Dombkowski 07/28/2018, 12:15 PM

## 2018-07-28 NOTE — Progress Notes (Signed)
Occupational Therapy Session Note  Patient Details  Name: [name] MRN: [id number] Date of Birth: 03/20/1961  Today's Date: 07/28/2018 OT Individual Time: 1000-1100 OT Individual Time Calculation (min): 60 min    Short Term Goals: Week 1:  OT Short Term Goal 1 (Week 1): STGs=LTGs secondary to short estimated LOS  Skilled Therapeutic Interventions/Progress Updates:    Pt received in w/c stating she had just finished her shower and dressing with her OT.  Pt requested to work on the [employer]. Pt propelled to day room and then used RW with steadying A to transfer to [place]. Pt worked at level 5 for 10 minutes using B arms and legs.  Level reduced to 1 for pt to trying pushing pedals with feet only.  This was very challenging for her and did cause some back discomfort.  Pt then used hands to A but with partial A so legs would have to work more actively.  Changed to pushing with L leg only with A from arms for partial ROM and then relaxing leg back to flexion 12x.   Pt then transferred to an arm chair to work on AROM of B legs with each foot on top of pillow case. Pt was able to actively flex/extend knee, rotate feet in circles, abd/adduct legs with L having greater AROM than the R.  Pt enjoyed these exercises and discussed having pt work on them in her room.  Pt transferred back to w/c, self propelled to room and then transferred to her recliner with RW with steadying A. Pt set up in recliner with all needs met.    Therapy Documentation Precautions:  Precautions Precautions: Back Precaution Comments: reviewed 3/3 back precautions Required Braces or Orthoses: Spinal Brace Spinal Brace: Lumbar corset Spinal Brace Comments: lumbar brace from home, applied in sitting Restrictions Weight Bearing Restrictions: No      Pain: Pain Assessment Pain Scale: 0-10 Pain Score: 7  Pain Type: Acute pain Pain Location: Flank Pain Orientation: Left Pain Descriptors / Indicators: Cramping Pain  Frequency: Intermittent Pain Onset: On-going Pain Intervention(s): Medication (See eMAR);Heat applied ADL:  Therapy/Group: Individual Therapy  SAGUIER,JULIA 07/28/2018, 12:18 PM

## 2018-07-28 NOTE — Progress Notes (Signed)
Occupational Therapy Weekly Progress Note  Patient Details  Name: [name] MRN: [id number] Date of Birth: October 29, 1960  Beginning of progress report period: July 21, 2018 End of progress report period: July 28, 2018  Today's Date: 07/28/2018 OT Individual Time: 4888-9169 OT Individual Time Calculation (min): 70 min    Patient has met 0 of 10 long term goals.  Short term goals not set due to estimated length of stay. Pt is making steady progress towards goals this week but has had barriers of increased back pain. Pt currently performing functional ambulation/transfers with min A and use of RW. Pt performing grooming and UB self care with set up/S. Min A for LB self care for balance with clothing management. Min cuing for back precautions with self care. Pt continues to benefit from OT intervention.   Patient continues to demonstrate the following deficits: muscle weakness, decreased cardiorespiratoy endurance and decreased sitting balance, decreased standing balance, decreased balance strategies and difficulty maintaining precautions and therefore will continue to benefit from skilled OT intervention to enhance overall performance with BADL.  See Patient's Care Plan for progression toward long term goals.  Patient progressing toward long term goals..  Continue plan of care.  Skilled Therapeutic Interventions/Progress Updates:    Upon entering the room, pt supine in bed and requesting to shower. OT covered IV to decrease risk of infection. Pt performed bed mobility with supervision to EOB. Pt needing min A for sit <>stand from EOB and ambulating into bathroom with no slip grip socks on and use of RW with steady assistance. Pt reports having multiple bowel movements in the night and still feeling a "pressure". RN notified and warm prune juice given to Pt at end of session. Pt seated on TTB with steady assistance and doffed clothing from seated position for safety. Pt ambulating and  returning to sit on EOB to don clothing items with set up A to obtain all needed items for UB dressing. Steady assistance for balance to pull pants over B hips and use of figure four position to thread onto feet. Pt transferred into recliner chair with min A stand pivot transfer. K pad placed on back for pain management. Call bell and all needed items within reach upon exiting the room.   Therapy Documentation Precautions:  Precautions Precautions: Back Precaution Comments: reviewed 3/3 back precautions Required Braces or Orthoses: Spinal Brace Spinal Brace: Lumbar corset Spinal Brace Comments: lumbar brace from home, applied in sitting Restrictions Weight Bearing Restrictions: No   Pain: Pain Assessment Pain Scale: 0-10 Pain Score: 8  Pain Type: Acute pain Pain Location: Back Pain Orientation: Lower Pain Descriptors / Indicators: Sharp Pain Frequency: Constant Pain Onset: Gradual Pain Intervention(s): Medication (See eMAR);Heat applied   Therapy/Group: Individual Therapy  [name] 07/28/2018, 12:45 PM

## 2018-07-28 NOTE — Progress Notes (Signed)
[place] PHYSICAL MEDICINE & REHABILITATION     PROGRESS NOTE    Subjective/Complaints: Had some abdominal cramping last night, moved to left flank. Better this morning.   ROS: Patient denies fever, rash, sore throat, blurred vision, nausea, vomiting, diarrhea, cough, shortness of breath or chest pain, joint or back pain, headache, or mood change.    Objective:  No results found. No results for input(s): WBC, HGB, HCT, PLT in the last 72 hours. No results for input(s): NA, K, CL, GLUCOSE, BUN, CREATININE, CALCIUM in the last 72 hours.  Invalid input(s): CO CBG (last 3)  No results for input(s): GLUCAP in the last 72 hours.  Wt Readings from Last 3 Encounters:  07/28/18 66 kg  07/12/18 66.7 kg  07/05/18 68 kg     Intake/Output Summary (Last 24 hours) at 07/28/2018 0841 Last data filed at 07/27/2018 2216 Gross per 24 hour  Intake 720 ml  Output -  Net 720 ml    Vital Signs: Blood pressure (!) 142/82, pulse 71, temperature (!) 97.4 F (36.3 C), temperature source Oral, resp. rate 18, height 5\' 5"  (1.651 m), weight 66 kg, SpO2 97 %. Physical Exam:  Constitutional: No distress . Vital signs reviewed. HEENT: EOMI, oral membranes moist Neck: supple Cardiovascular: RRR without murmur. No JVD    Respiratory: CTA Bilaterally without wheezes or rales. Normal effort    GI: BS +, non-tender, non-distended  Musculoskeletal: LB remains TTP.    Neurological: Nocranial nerve deficit.Coordinationnormal.  UE 5/5. LE 2- to 3/5. Knee ext 3/5, HAD 3-4, HAB 3-4, ADF/PF remains inconsistent, cogwheeling Skin:intact Psychiatric: pleasant but anxious  Assessment/Plan: 1. Functional deficits secondary to lumbar radiculopathy which require 3+ hours per day of interdisciplinary therapy in a comprehensive inpatient rehab setting. Physiatrist is providing close team supervision and 24 hour management of active medical problems listed below. Physiatrist and rehab team continue to assess  barriers to discharge/monitor patient progress toward functional and medical goals.  Function:  Bathing Bathing position   Position: Shower  Bathing parts Body parts bathed by patient: Right arm, Left arm, Chest, Abdomen, Front perineal area, Right upper leg, Left upper leg, Buttocks, Right lower leg, Left lower leg Body parts bathed by helper: Buttocks, Right lower leg, Left lower leg, Back  Bathing assist Assist Level: Supervision or verbal cues      Upper Body Dressing/Undressing Upper body dressing   What is the patient wearing?: Pull over shirt/dress, Bra Bra - Perfomed by patient: Thread/unthread right bra strap, Thread/unthread left bra strap, Hook/unhook bra (pull down sports bra)   Pull over shirt/dress - Perfomed by patient: Thread/unthread right sleeve, Thread/unthread left sleeve, Put head through opening, Pull shirt over trunk          Upper body assist Assist Level: Set up, Supervision or verbal cues   Set up : To obtain clothing/put away  Lower Body Dressing/Undressing Lower body dressing   What is the patient wearing?: Pants, Non-skid slipper socks, Underwear Underwear - Performed by patient: Thread/unthread left underwear leg, Pull underwear up/down, Thread/unthread right underwear leg Underwear - Performed by helper: Thread/unthread right underwear leg Pants- Performed by patient: Thread/unthread left pants leg, Pull pants up/down, Thread/unthread right pants leg Pants- Performed by helper: Thread/unthread right pants leg Non-skid slipper socks- Performed by patient: Don/doff left sock, Don/doff right sock Non-skid slipper socks- Performed by helper: Don/doff right sock, Don/doff left sock                  Lower body  assist Assist for lower body dressing: Touching or steadying assistance (Pt > 75%)      Toileting Toileting   Toileting steps completed by patient: Adjust clothing prior to toileting, Performs perineal hygiene, Adjust clothing after  toileting   Toileting Assistive Devices: Grab bar or rail  Toileting assist Assist level: Touching or steadying assistance (Pt.75%)   Transfers Chair/bed transfer   Chair/bed transfer method: Ambulatory Chair/bed transfer assist level: Touching or steadying assistance (Pt > 75%) Chair/bed transfer assistive device: Walker, Orthosis, Armrests     Locomotion Ambulation     Max distance: 40 Assist level: Touching or steadying assistance (Pt > 75%)   Wheelchair   Type: Manual Max wheelchair distance: 150' Assist Level: No help, No cues, assistive device, takes more than reasonable amount of time  Cognition Comprehension Comprehension assist level: Follows complex conversation/direction with extra time/assistive device  Expression Expression assist level: Expresses complex ideas: With extra time/assistive device  Social Interaction Social Interaction assist level: Interacts appropriately with others with medication or extra time (anti-anxiety, antidepressant).  Problem Solving Problem solving assist level: Solves complex problems: With extra time  Memory Memory assist level: More than reasonable amount of time  Medical Problem List and Plan: 1.Decreased functional mobilitysecondary to acute onchronic lumbar radiculopathy and foraminal stenosis bilaterally with decompression May 2019. Recent fall with exacerbation of pain and motor or sensory deficits.  --received ESI 9/25  -improving mobility and confidence 2. DVT Prophylaxis/Anticoagulation: SCDs. 3. Pain Management:Neurontin: titrated to 400mg  tid  -continue Robaxin 500 mg 3 times daily, Ultram 50 mg every 12 hours, oxycodone as needed -improving 4. Mood:Ativan 0.25-0.5 mg daily as needed 5. Neuropsych: This patientiscapable of making decisions on herown behalf. 6. Skin/Wound Care:Routine skin checks 7. Fluids/Electrolytes/Nutrition:reasonable PO intake .   8.Hypertension. Coreg  6.25 mg twice daily. Monitor with increased mobility 9.Hyperlipidemia. Lipitor 20 mg daily. 10.Constipation. Laxative assistance.   -is moving bowels  -?Gi spasms----levsin   LOS (Days) 8 A FACE TO FACE EVALUATION WAS PERFORMED  Meredith Staggers, MD 07/28/2018 8:41 AM

## 2018-07-28 NOTE — Progress Notes (Signed)
Social Work Patient ID: [name], female   DOB: October 29, 1960, 57 y.o.   MRN: [id number]   Have reviewed team conference with pt this afternoon who is now aware of correct targeted d/c date of 10/8.  She feels she is making progress but does feel she needs these days to reach mod ind goals.  Have also spoken with worker's comp CM and faxed requested progress notes.  Will continue to follow.  Kyleah P[place]abene, LCSW

## 2018-07-29 ENCOUNTER — Inpatient Hospital Stay (HOSPITAL_COMMUNITY): Payer: Self-pay | Admitting: Physical Therapy

## 2018-07-29 ENCOUNTER — Inpatient Hospital Stay (HOSPITAL_COMMUNITY): Payer: Self-pay | Admitting: Occupational Therapy

## 2018-07-29 MED ORDER — SIMETHICONE 80 MG PO CHEW
80.0000 mg | CHEWABLE_TABLET | Freq: Four times a day (QID) | ORAL | Status: DC | PRN
  Administered 2018-07-29 – 2018-07-30 (×2): 80 mg via ORAL
  Filled 2018-07-29 (×2): qty 1

## 2018-07-29 MED ORDER — DICLOFENAC SODIUM 1 % TD GEL
2.0000 g | Freq: Three times a day (TID) | TRANSDERMAL | Status: DC
  Administered 2018-07-29 – 2018-08-03 (×16): 2 g via TOPICAL
  Filled 2018-07-29: qty 100

## 2018-07-29 MED ORDER — BISACODYL 10 MG RE SUPP
10.0000 mg | Freq: Every day | RECTAL | Status: DC | PRN
  Administered 2018-07-29 – 2018-07-31 (×2): 10 mg via RECTAL
  Filled 2018-07-29 (×2): qty 1

## 2018-07-29 MED ORDER — HYOSCYAMINE SULFATE 0.125 MG SL SUBL
0.1250 mg | SUBLINGUAL_TABLET | Freq: Once | SUBLINGUAL | Status: AC
  Administered 2018-07-29: 0.125 mg via SUBLINGUAL

## 2018-07-29 NOTE — Progress Notes (Signed)
Physical Therapy Weekly Progress Note  Patient Details  Name: [name] MRN: [id number] Date of Birth: May 16, 1961  Beginning of progress report period: July 21, 2018 End of progress report period: July 29, 2018  Today's Date: 07/29/2018 PT Individual Time: 1000-1030 PT Individual Time Calculation (min): 30 min   Patient has met 0 of 9 short term goals.  No short term goals set due to ELOS.  Pt is making progress towards long term goals but has been limited by pain and anxiety.    Patient continues to demonstrate the following deficits muscle weakness, abnormal tone and decreased coordination and decreased standing balance and decreased balance strategies and therefore will continue to benefit from skilled PT intervention to increase functional independence with mobility.  Patient progressing toward long term goals..  Continue plan of care.  PT Short Term Goals No short term goals set  Skilled Therapeutic Interventions/Progress Updates:    Pt doubled over in pain on arrival, crying, rating 10/10.  RN in to assess, pt states it feels like cramping pain.  Agreeable to attempt toileting.  Mod assist for stand/pivots throughout session 2/2 pain.  (+) small BM on toilet with no relief in pain.  Pt returned to bed at end of session positioned in R side lying with heat applied to abdomen and back, pillow in front for extra support and knees drawn to chest for relief on abdomen.  PA aware of pt's pain level.    Therapy Documentation Precautions:  Precautions Precautions: Back Precaution Comments: reviewed 3/3 back precautions Required Braces or Orthoses: Spinal Brace Spinal Brace: Lumbar corset Spinal Brace Comments: lumbar brace from home, applied in sitting Restrictions Weight Bearing Restrictions: No General: PT Amount of Missed Time (min): 30 Minutes PT Missed Treatment Reason: Pain(10/10 abdominal pain)   Therapy/Group: Individual Therapy  Michel Santee 07/29/2018, 11:00 AM

## 2018-07-29 NOTE — Progress Notes (Signed)
[place] PHYSICAL MEDICINE & REHABILITATION     PROGRESS NOTE    Subjective/Complaints: In good spirits. Getting dressed.   ROS: Patient denies fever, rash, sore throat, blurred vision, nausea, vomiting, diarrhea, cough, shortness of breath or chest pain, joint or back pain, headache, or mood change.   Objective:  No results found. No results for input(s): WBC, HGB, HCT, PLT in the last 72 hours. No results for input(s): NA, K, CL, GLUCOSE, BUN, CREATININE, CALCIUM in the last 72 hours.  Invalid input(s): CO CBG (last 3)  No results for input(s): GLUCAP in the last 72 hours.  Wt Readings from Last 3 Encounters:  07/28/18 66 kg  07/12/18 66.7 kg  07/05/18 68 kg     Intake/Output Summary (Last 24 hours) at 07/29/2018 0927 Last data filed at 07/29/2018 0900 Gross per 24 hour  Intake 840 ml  Output -  Net 840 ml    Vital Signs: Blood pressure 122/77, pulse 70, temperature 97.6 F (36.4 C), temperature source Oral, resp. rate 18, height 5\' 5"  (1.651 m), weight 66 kg, SpO2 100 %. Physical Exam:  Constitutional: No distress . Vital signs reviewed. HEENT: EOMI, oral membranes moist Neck: supple Cardiovascular: RRR without murmur. No JVD    Respiratory: CTA Bilaterally without wheezes or rales. Normal effort    GI: BS +, non-tender, non-distended  Musculoskeletal: LB remains TTP.    Neurological: Nocranial nerve deficit.Coordinationnormal.  UE 5/5. LE 2- to 3/5. Knee ext 3/5, HAD 3-4, HAB 3-4, ADF/PF remains inconsistent, cogwheeling Skin:intact Psychiatric: pleasant but anxious  Assessment/Plan: 1. Functional deficits secondary to lumbar radiculopathy which require 3+ hours per day of interdisciplinary therapy in a comprehensive inpatient rehab setting. Physiatrist is providing close team supervision and 24 hour management of active medical problems listed below. Physiatrist and rehab team continue to assess barriers to discharge/monitor patient progress toward  functional and medical goals.  Function:  Bathing Bathing position   Position: Shower  Bathing parts Body parts bathed by patient: Right arm, Left arm, Chest, Abdomen, Front perineal area, Right upper leg, Left upper leg, Buttocks, Right lower leg, Left lower leg Body parts bathed by helper: Buttocks, Right lower leg, Left lower leg, Back  Bathing assist Assist Level: Supervision or verbal cues      Upper Body Dressing/Undressing Upper body dressing   What is the patient wearing?: Pull over shirt/dress, Bra Bra - Perfomed by patient: Thread/unthread right bra strap, Thread/unthread left bra strap, Hook/unhook bra (pull down sports bra)   Pull over shirt/dress - Perfomed by patient: Thread/unthread right sleeve, Thread/unthread left sleeve, Put head through opening, Pull shirt over trunk          Upper body assist Assist Level: Set up, Supervision or verbal cues   Set up : To obtain clothing/put away  Lower Body Dressing/Undressing Lower body dressing   What is the patient wearing?: Pants, Non-skid slipper socks, Underwear Underwear - Performed by patient: Thread/unthread left underwear leg, Pull underwear up/down, Thread/unthread right underwear leg Underwear - Performed by helper: Thread/unthread right underwear leg Pants- Performed by patient: Thread/unthread left pants leg, Pull pants up/down, Thread/unthread right pants leg Pants- Performed by helper: Thread/unthread right pants leg Non-skid slipper socks- Performed by patient: Don/doff left sock, Don/doff right sock Non-skid slipper socks- Performed by helper: Don/doff right sock, Don/doff left sock                  Lower body assist Assist for lower body dressing: Touching or steadying assistance (Pt >  75%)      Toileting Toileting   Toileting steps completed by patient: Adjust clothing prior to toileting, Performs perineal hygiene, Adjust clothing after toileting   Toileting Assistive Devices: Grab bar or rail   Toileting assist Assist level: Touching or steadying assistance (Pt.75%)   Transfers Chair/bed transfer   Chair/bed transfer method: Ambulatory Chair/bed transfer assist level: Touching or steadying assistance (Pt > 75%) Chair/bed transfer assistive device: Walker, Orthosis, Armrests     Locomotion Ambulation     Max distance: 40 Assist level: Touching or steadying assistance (Pt > 75%)   Wheelchair   Type: Manual Max wheelchair distance: 150' Assist Level: No help, No cues, assistive device, takes more than reasonable amount of time  Cognition Comprehension Comprehension assist level: Follows complex conversation/direction with extra time/assistive device  Expression Expression assist level: Expresses complex ideas: With extra time/assistive device  Social Interaction Social Interaction assist level: Interacts appropriately with others with medication or extra time (anti-anxiety, antidepressant).  Problem Solving Problem solving assist level: Solves complex problems: With extra time  Memory Memory assist level: More than reasonable amount of time  Medical Problem List and Plan: 1.Decreased functional mobilitysecondary to acute onchronic lumbar radiculopathy and foraminal stenosis bilaterally with decompression May 2019. Recent fall with exacerbation of pain and motor or sensory deficits.  --received ESI 9/25  -improving mobility and confidence 2. DVT Prophylaxis/Anticoagulation: SCDs. 3. Pain Management:Neurontin: titrated to 400mg  tid  -continue Robaxin 500 mg 3 times daily, Ultram 50 mg every 12 hours, oxycodone as needed -right knee pain???    -add voltaren gel, prn ice 4. Mood:Ativan 0.25-0.5 mg daily as needed 5. Neuropsych: This patientiscapable of making decisions on herown behalf. 6. Skin/Wound Care:Routine skin checks 7. Fluids/Electrolytes/Nutrition:reasonable PO intake .   8.Hypertension. Coreg 6.25 mg twice daily.  Monitor with increased mobility 9.Hyperlipidemia. Lipitor 20 mg daily. 10.Constipation. Laxative assistance.   -is moving bowels  -?Gi spasms----levsin PRN added   LOS (Days) [place] EVALUATION WAS PERFORMED  Meredith Staggers, MD 07/29/2018 9:27 AM

## 2018-07-29 NOTE — Progress Notes (Signed)
Physical Therapy Session Note  Patient Details  Name: [name] MRN: [id number] Date of Birth: 1960/12/28  Today's Date: 07/29/2018 PT Individual Time: 1500-1630 PT Individual Time Calculation (min): 90 min   Short Term Goals: Week 2:   =LTGs  Skilled Therapeutic Interventions/Progress Updates:    pt reporting feeling much better this PM, agreeable to attempting to make up time from AM session.  No reports of pain at start of session though does report 5/10 in back with activity.  Monitored throughout session.  Session focus on functional mobility progression, strengthening, and activity tolerance.   Pt transfers throughout session with RW and close supervision.  Toileting to start session, pt ambulates into bathroom with RW and close supervision, 3/3 toileting steps with set up assist.  Gait training 2x50' with RW and close supervision, occasional CGA with turn/sit.  Stair negotiation 2x4 steps with 2 rails, leading with RLE to ascend, LLE to descend with CGA/min assist.  Rest breaks between gait and stair trials 2/2 fatigue.  UE therex 2x20 reps with 4# dowel for shoulder press, chest press, and rows.  Nustep x10 minutes with 4 extremities for strengthening and overall activity tolerance.  W/C propulsion throughout unit with supervision, max distance 200'.  Pt returned to recliner at end of session with supervision for stand/pivot with RW.  Call bell in reach and needs met.   Therapy Documentation Precautions:  Precautions Precautions: Back Precaution Comments: reviewed 3/3 back precautions Required Braces or Orthoses: Spinal Brace Spinal Brace: Lumbar corset Spinal Brace Comments: lumbar brace from home, applied in sitting Restrictions Weight Bearing Restrictions: No    Therapy/Group: Individual Therapy  Michel Santee 07/29/2018, 4:08 PM

## 2018-07-29 NOTE — Progress Notes (Signed)
Occupational Therapy Session Note  Patient Details  Name: [name] MRN: [id number] Date of Birth: 11-10-1960  Today's Date: 07/29/2018 OT Individual Time: 0355-9741 OT Individual Time Calculation (min): 69 min    Short Term Goals: Week 2:  OT Short Term Goal 1 (Week 2): STGs=LTGs secondary to upcoming discharge  Skilled Therapeutic Interventions/Progress Updates:    Upon entering the room, pt supine in bed awaiting OT arrival. Pt reports 4/10 pain in lower back but agreeable to OT intervention and requesting to shower. Bed mobility performed with supervision for safety. Sit >stand with RW and steady assistance. Pt ambulating 5' towards bathroom and having increased weakness in L LE. OT manually assisting pt with weight shift and advancement of L LE. Pt seated on TTB with min A for bathing at seated level. Lateral leans to wash buttocks and use of LH sponge to wash B LEs and back to increase independence with task. Pt ambulating with min A 10' back into room with RW and seated on EOB to don clothing items. Pt needing increased time to complete task. Pt utilized figure 4 position to thread clothing onto B LEs and needing steady assistance when standing to pull pants over B hips. Pt transferred into recliner chair with min stand pivot transfer with use of RW. Breakfast tray placed in front of her and OT continued education regarding energy conservation. OT provided paper handout of information as well. Call bell and all needed items within reach upon exiting the room.   Therapy Documentation Precautions:  Precautions Precautions: Back Precaution Comments: reviewed 3/3 back precautions Required Braces or Orthoses: Spinal Brace Spinal Brace: Lumbar corset Spinal Brace Comments: lumbar brace from home, applied in sitting Restrictions Weight Bearing Restrictions: No General: General PT Missed Treatment Reason: Pain(10/10 abdominal pain)   Therapy/Group: Individual  Therapy  [name] 07/29/2018, 11:28 AM

## 2018-07-30 ENCOUNTER — Inpatient Hospital Stay (HOSPITAL_COMMUNITY): Payer: Self-pay | Admitting: Physical Therapy

## 2018-07-30 ENCOUNTER — Inpatient Hospital Stay (HOSPITAL_COMMUNITY): Payer: Self-pay | Admitting: Occupational Therapy

## 2018-07-30 NOTE — Progress Notes (Signed)
Physical Therapy Session Note  Patient Details  Name: [name] MRN: [id number] Date of Birth: May 04, 1961  Today's Date: 07/30/2018 PT Individual Time: 1030-1058 PT Individual Time Calculation (min): 28 min   Short Term Goals: Week 1:  PT Short Term Goal 1 (Week 1): =LTGs due to ELOS  Skilled Therapeutic Interventions/Progress Updates:    session focused on gait training with RW. Pt able to perform gait in controlled environment x 75' with CGA with RW, limited by Rt LE fatigue and spasms.  Gait with obstacle negotiations 3 x 19' with pt needing to sit early on last attempt due to Rt LE spasms and fatigue.  Pt propels w/c to room with supervision, min A squat pivot transfer to recliner. Pt left in room with needs at hand.  Therapy Documentation Precautions:  Precautions Precautions: Back Precaution Comments: reviewed 3/3 back precautions Required Braces or Orthoses: Spinal Brace Spinal Brace: Lumbar corset Spinal Brace Comments: lumbar brace from home, applied in sitting Restrictions Weight Bearing Restrictions: No Pain: No c/o pain   Therapy/Group: Individual Therapy  DONAWERTH,KAREN 07/30/2018, 10:58 AM

## 2018-07-30 NOTE — Progress Notes (Signed)
Occupational Therapy Session Note  Patient Details  Name: [name] MRN: [id number] Date of Birth: [birth year]  Today's Date: 07/30/2018 OT Individual Time: 6568-1275 and 1700-1749 and 1307-1405 OT Individual Time Calculation (min): 70 min and 29 min and 58 min   Short Term Goals: Week 2:  OT Short Term Goal 1 (Week 2): STGs=LTGs secondary to upcoming discharge  Skilled Therapeutic Interventions/Progress Updates:    Pt greeted in bed, premedicated for pain and stating that pain "wasn't bad" this morning. Requesting to shower. Tx focus on balance, functional ambulation with device, and standing endurance during bathing, dressing, toileting, and grooming tasks. All functional transfers completed at ambulatory level using RW with steady assist. She required mod vcs for "silent steps" to improve B foot drag. Pt bathed at sit<stand level with close supervision. She used LH sponge for LEs and back. Discussed her shower setup at home with plans to have spouse be present to maximize her safety during bathing. She verbalized understanding. Already has shower chair and vertical grab bar. Afterwards dressing was completed EOB at sit<stand level using RW with increased time. Pt exhibits carryover of energy conservation techniques with min vcs. She completed oral care while standing at sink with steady assist, and additional grooming tasks while seated for energy conservation. Toileting completed at ambulatory level with steady assist as well. For remainder of session, worked on LE strengthening and standing endurance via IADL bedmaking task. Pt stood without UE support on walker while spreading out sheets, covers, and blankets. She side-stepped and ambulated around bed with close supervision and min vcs for keeping walker on floor. Pt sat while doffing/donning pillowcases due to standing fatigue. At end of session pt was left in recliner with all needs within reach and k-pad applied to back.   2nd  Session 1:1 tx (29 min) Pt greeted in recliner. She reported pain to be manageable for tx. Started session with toilet transfer at ambulatory level using RW with steady assist. After handwashing, had pt engage in plant care with bouquet of flowers in room. Pt stood without UE support to trim stems, remove flowers, and then rearrange them into vase of clean water. Pt standing for 6 minutes at most before needing a seated rest break. At end of session pt returned to recliner and was let with setup for lunch. Call bell within reach.   3rd Session 1:1 tx (58 min) Pt greeted in recliner. She reported pain to be manageable for tx. 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 Escorted pt to 1st floor lab via w/c. While visiting with coworkers, pt ambulated with RW and steady assist in lab. She required mod vcs to increase weight shift Lt>Rt to improve foot drag. While visiting 2nd lab setting, she self propelled in w/c, navigating around barriers and obstacles with min vcs and increased time. Afterwards, escorted pt to gift shop via w/c. There, she ambulated with RW and close supervision-steady assist, removing unilateral or bilateral UE support to pick up stuffed animals from shelves or look at jewelry/eyewear. Afterwards pt self propelled halfway back to room, and was escorted remainder of way due to fatigue. She completed stand pivot<recliner with steady assist and was left with all needs within reach. Tx focus today placed on activity tolerance, standing endurance, balance, and functional ambulation in community environments.    Therapy Documentation Precautions:  Precautions Precautions: Back Precaution Comments: reviewed 3/3 back precautions Required Braces or Orthoses: Spinal  Brace Spinal Brace: Lumbar corset Spinal Brace Comments: lumbar brace from home, applied in sitting Restrictions Weight Bearing Restrictions: No Pain:  Stated/addressed above  Pain Assessment Pain Scale: 0-10 Pain Score: 6  Pain Type: Acute pain Pain Location: Back Pain Orientation: Right;Lower Pain Descriptors / Indicators: Aching;Discomfort;Dull Pain Frequency: Constant Pain Onset: On-going Pain Intervention(s): Medication (See eMAR);Heat applied ADL:       Therapy/Group: Individual Therapy  Madalaine Portier A Diem Pagnotta 07/30/2018, 9:11 AM

## 2018-07-30 NOTE — Progress Notes (Signed)
[facility]ILIT[facility]     PROGRESS NOTE    Subjective/Complaints: Pt up with therapy. voltaren gel helped with left knee pain. Gait and balance and improving  ROS: Patient denies fever, rash, sore throat, blurred vision, nausea, vomiting, diarrhea, cough, shortness of breath or chest pain, joint or back pain, headache, or mood change.    Objective:  No results found. No results for input(s): WBC, HGB, HCT, PLT in the last 72 hours. No results for input(s): NA, K, CL, GLUCOSE, BUN, CREATININE, CALCIUM in the last 72 hours.  Invalid input(s): CO CBG (last 3)  No results for input(s): GLUCAP in the last 72 hours.  Wt Readings from Last 3 Encounters:  07/28/18 66 kg  07/12/18 66.7 kg  07/05/18 68 kg     Intake/Output Summary (Last 24 hours) at 07/30/2018 1007 Last data filed at 07/30/2018 0800 Gross per 24 hour  Intake 720 ml  Output -  Net 720 ml    Vital Signs: Blood pressure 128/81, pulse 71, temperature 97.7 F (36.5 C), temperature source Oral, resp. rate 12, height 5\' 5"  (1.651 m), weight 66 kg, SpO2 98 %. Physical Exam:  Constitutional: No distress . Vital signs reviewed. HEENT: EOMI, oral membranes moist Neck: supple Cardiovascular: RRR without murmur. No JVD    Respiratory: CTA Bilaterally without wheezes or rales. Normal effort    GI: BS +, non-tender, non-distended  Musculoskeletal: LB remains TTP.  Left knee with ?tenderness, no effusion  Neurological: Nocranial nerve deficit.Coordinationnormal.  UE 5/5. LE 2- to 3/5. Knee ext 3/5, HAD 3-4, HAB 3-4, ADF/PF remains inconsistent, cog-wheeling effort at times Skin:intact Psychiatric: pleasant but anxious  Assessment/Plan: 1. Functional deficits secondary to lumbar radiculopathy which require 3+ hours per day of interdisciplinary therapy in a comprehensive inpatient rehab setting. Physiatrist is providing close team supervision and 24 hour management of active medical problems  listed below. Physiatrist and rehab team continue to assess barriers to discharge/monitor patient progress toward functional and medical goals.  Function:  Bathing Bathing position   Position: Shower  Bathing parts Body parts bathed by patient: Right arm, Left arm, Chest, Abdomen, Front perineal area, Right upper leg, Left upper leg, Buttocks, Right lower leg, Left lower leg Body parts bathed by helper: Buttocks, Right lower leg, Left lower leg, Back  Bathing assist Assist Level: Supervision or verbal cues      Upper Body Dressing/Undressing Upper body dressing   What is the patient wearing?: Pull over shirt/dress, Bra Bra - Perfomed by patient: Thread/unthread right bra strap, Thread/unthread left bra strap, Hook/unhook bra (pull down sports bra)   Pull over shirt/dress - Perfomed by patient: Thread/unthread right sleeve, Thread/unthread left sleeve, Put head through opening, Pull shirt over trunk          Upper body assist Assist Level: Set up, Supervision or verbal cues   Set up : To obtain clothing/put away  Lower Body Dressing/Undressing Lower body dressing   What is the patient wearing?: Pants, Non-skid slipper socks, Underwear Underwear - Performed by patient: Thread/unthread left underwear leg, Pull underwear up/down, Thread/unthread right underwear leg Underwear - Performed by helper: Thread/unthread right underwear leg Pants- Performed by patient: Thread/unthread left pants leg, Pull pants up/down, Thread/unthread right pants leg Pants- Performed by helper: Thread/unthread right pants leg Non-skid slipper socks- Performed by patient: Don/doff left sock, Don/doff right sock Non-skid slipper socks- Performed by helper: Don/doff right sock, Don/doff left sock  Lower body assist Assist for lower body dressing: Touching or steadying assistance (Pt > 75%)      Toileting Toileting   Toileting steps completed by patient: Adjust clothing prior to  toileting, Performs perineal hygiene, Adjust clothing after toileting   Toileting Assistive Devices: Grab bar or rail  Toileting assist Assist level: Touching or steadying assistance (Pt.75%)   Transfers Chair/bed transfer   Chair/bed transfer method: Ambulatory Chair/bed transfer assist level: Touching or steadying assistance (Pt > 75%) Chair/bed transfer assistive device: Walker, Orthosis, Armrests     Locomotion Ambulation     Max distance: 40 Assist level: Touching or steadying assistance (Pt > 75%)   Wheelchair   Type: Manual Max wheelchair distance: 150' Assist Level: No help, No cues, assistive device, takes more than reasonable amount of time  Cognition Comprehension Comprehension assist level: Follows complex conversation/direction with extra time/assistive device  Expression Expression assist level: Expresses complex ideas: With extra time/assistive device  Social Interaction Social Interaction assist level: Interacts appropriately with others with medication or extra time (anti-anxiety, antidepressant).  Problem Solving Problem solving assist level: Solves complex problems: With extra time  Memory Memory assist level: More than reasonable amount of time  Medical Problem List and Plan: 1.Decreased functional mobilitysecondary to acute onchronic lumbar radiculopathy and foraminal stenosis bilaterally with decompression May 2019. Recent fall with exacerbation of pain and motor or sensory deficits.  --received ESI 9/25  -improving mobility and confidence 2. DVT Prophylaxis/Anticoagulation: SCDs. 3. Pain Management:Neurontin: titrated to 400mg  tid  -continue Robaxin 500 mg 3 times daily, Ultram 50 mg every 12 hours, oxycodone as needed -left knee pain     -continue voltaren gel, prn ice 4. Mood:Ativan 0.25-0.5 mg daily as needed  -continued ego support provided  -discussed the impact of her anxiety on her physical problems 5.  Neuropsych: This patientiscapable of making decisions on herown behalf. 6. Skin/Wound Care:Routine skin checks 7. Fluids/Electrolytes/Nutrition:reasonable PO intake at present .   8.Hypertension. Coreg 6.25 mg twice daily. Monitor with increased mobility 9.Hyperlipidemia. Lipitor 20 mg daily. 10.Constipation. Laxative assistance.   -is moving bowels regularly  -?Gi spasms/IBS----levsin PRN added and helpful  -again discussed impact of anxiety    LOS (Days) 10 A FACE TO FACE EVALUATION WAS PERFORMED  Meredith Staggers, MD 07/30/2018 10:07 AM

## 2018-07-31 ENCOUNTER — Inpatient Hospital Stay (HOSPITAL_COMMUNITY): Payer: Self-pay | Admitting: Occupational Therapy

## 2018-07-31 DIAGNOSIS — M5410 Radiculopathy, site unspecified: Secondary | ICD-10-CM

## 2018-07-31 DIAGNOSIS — D62 Acute posthemorrhagic anemia: Secondary | ICD-10-CM

## 2018-07-31 DIAGNOSIS — M48061 Spinal stenosis, lumbar region without neurogenic claudication: Secondary | ICD-10-CM

## 2018-07-31 NOTE — Progress Notes (Signed)
[facility] RE[facility]ILIT[facility]     PROGRESS NOTE    Subjective/Complaints: Patient seen sitting up in bed this morning.  She states she slept well overnight.  She has chronic back pain.  ROS: + Chronic back pain.  Denies CP, SOB, nausea, vomiting, diarrhea.  Objective:  No results found. No results for input(s): WBC, HGB, HCT, PLT in the last 72 hours. No results for input(s): NA, K, CL, GLUCOSE, BUN, CREATININE, CALCIUM in the last 72 hours.  Invalid input(s): CO CBG (last 3)  No results for input(s): GLUCAP in the last 72 hours.  Wt Readings from Last 3 Encounters:  07/28/18 66 kg  07/12/18 66.7 kg  07/05/18 68 kg     Intake/Output Summary (Last 24 hours) at 07/31/2018 0802 Last data filed at 07/30/2018 1835 Gross per 24 hour  Intake 540 ml  Output -  Net 540 ml    Vital Signs: Blood pressure 111/60, pulse 75, temperature 97.7 F (36.5 C), temperature source Oral, resp. rate 18, height 5\' 5"  (1.651 m), weight 66 kg, SpO2 98 %. Physical Exam:  Constitutional: No distress . Vital signs reviewed. HENT: Normocephalic.  Atraumatic. Eyes: EOMI. No discharge. Cardiovascular: RRR. No JVD. Respiratory: CTA Bilaterally. Normal effort. GI: BS +. Non-distended. Musculoskeletal:LB TTP.   Neurological: Alert and oriented Motor: B/l UE 5/5.  RLE: 2/5 proximal to distal, inconsistent effort LLE: 4/5 proximal to distal Skin:intact Psychiatric: Anxious  Assessment/Plan: 1. Functional deficits secondary to lumbar radiculopathy which require 3+ hours per day of interdisciplinary therapy in a comprehensive inpatient rehab setting. Physiatrist is providing close team supervision and 24 hour management of active medical problems listed below. Physiatrist and rehab team continue to assess barriers to discharge/monitor patient progress toward functional and medical goals.  Function:  Bathing Bathing position   Position: Shower  Bathing parts Body parts bathed  by patient: Right arm, Left arm, Chest, Abdomen, Front perineal area, Right upper leg, Left upper leg, Buttocks, Right lower leg, Left lower leg Body parts bathed by helper: Buttocks, Right lower leg, Left lower leg, Back  Bathing assist Assist Level: Supervision or verbal cues      Upper Body Dressing/Undressing Upper body dressing   What is the patient wearing?: Pull over shirt/dress, Bra Bra - Perfomed by patient: Thread/unthread right bra strap, Thread/unthread left bra strap, Hook/unhook bra (pull down sports bra)   Pull over shirt/dress - Perfomed by patient: Thread/unthread right sleeve, Thread/unthread left sleeve, Put head through opening, Pull shirt over trunk          Upper body assist Assist Level: Set up, Supervision or verbal cues   Set up : To obtain clothing/put away  Lower Body Dressing/Undressing Lower body dressing   What is the patient wearing?: Pants, Non-skid slipper socks, Underwear Underwear - Performed by patient: Thread/unthread left underwear leg, Pull underwear up/down, Thread/unthread right underwear leg Underwear - Performed by helper: Thread/unthread right underwear leg Pants- Performed by patient: Thread/unthread left pants leg, Pull pants up/down, Thread/unthread right pants leg Pants- Performed by helper: Thread/unthread right pants leg Non-skid slipper socks- Performed by patient: Don/doff left sock, Don/doff right sock Non-skid slipper socks- Performed by helper: Don/doff right sock, Don/doff left sock                  Lower body assist Assist for lower body dressing: Touching or steadying assistance (Pt > 75%)      Toileting Toileting   Toileting steps completed by patient: Adjust clothing prior to  toileting, Performs perineal hygiene, Adjust clothing after toileting   Toileting Assistive Devices: Grab bar or rail  Toileting assist Assist level: Touching or steadying assistance (Pt.75%)   Transfers Chair/bed transfer   Chair/bed  transfer method: Ambulatory Chair/bed transfer assist level: Touching or steadying assistance (Pt > 75%) Chair/bed transfer assistive device: Walker, Orthosis, Armrests     Locomotion Ambulation     Max distance: 40 Assist level: Touching or steadying assistance (Pt > 75%)   Wheelchair   Type: Manual Max wheelchair distance: 150' Assist Level: No help, No cues, assistive device, takes more than reasonable amount of time  Cognition Comprehension Comprehension assist level: Follows complex conversation/direction with extra time/assistive device  Expression Expression assist level: Expresses complex ideas: With extra time/assistive device  Social Interaction Social Interaction assist level: Interacts appropriately with others with medication or extra time (anti-anxiety, antidepressant).  Problem Solving Problem solving assist level: Solves complex problems: With extra time  Memory Memory assist level: More than reasonable amount of time  Medical Problem List and Plan: 1.Decreased functional mobilitysecondary to acute onchronic lumbar radiculopathy and foraminal stenosis bilaterally with decompression May 2019. Recent fall with exacerbation of pain and motor or sensory deficits.  --received ESI 9/25  Cont CIR  Notes reviewed- Lumbar radiculopathy status post decompression and ESI, images reviewed- L2-3 canal stenosis, labs reviewed 2. DVT Prophylaxis/Anticoagulation: SCDs. 3. Pain Management:Neurontin: titrated to 400mg  tid  -continue Robaxin 500 mg 3 times daily, Ultram 50 mg every 12 hours, oxycodone as needed -left knee pain     -continue voltaren gel, prn ice 4. Mood:Ativan 0.25-0.5 mg daily as needed  -continued ego support provided 5. Neuropsych: This patientiscapable of making decisions on herown behalf. 6. Skin/Wound Care:Routine skin checks 7. Fluids/Electrolytes/Nutrition:reasonable PO intake at present  BMP is within acceptable  range on 9/25  labs ordered for Monday 8.Hypertension. Coreg 6.25 mg twice daily. Monitor with increased mobility  Controlled on 10/5 9.Hyperlipidemia. Lipitor 20 mg daily. 10.Constipation. Laxative assistance.   -is moving bowels regularly  -?Gi spasms/IBS----levsin PRN added and helpful 11.  Acute blood loss anemia  hemoglobin 11.8 on 9/25  Labs ordered for Monday   LOS (Days) 11 A FACE TO FACE EVALUATION WAS PERFORMED  Ishaan Villamar Lorie Phenix, MD 07/31/2018 8:02 AM

## 2018-07-31 NOTE — Progress Notes (Signed)
Occupational Therapy Session Note  Patient Details  Name: [name] MRN: [id number] Date of Birth: 03-05-1961  Today's Date: 07/31/2018 OT Individual Time: 8403-7543 OT Individual Time Calculation (min): 27 min   Short Term Goals: Week 2:  OT Short Term Goal 1 (Week 2): STGs=LTGs secondary to upcoming discharge  Skilled Therapeutic Interventions/Progress Updates:    Pt greeted in w/c and reported pain level to be manageable for tx. Requesting to make up her bed. Pt ambulated around the bed using RW with close supervision. She required cues for improving B foot drag and for walker placement when side-stepping. Pt removed B UEs from device to spread out, apply, and tuck bed linen. Pt taking more time for detail, as this is a meaningful IADL that she completes in her daily routine at home. At end of session pt returned to w/c, reporting back pain was increasing. RN notified to provide pain medicine. At end of session pt was repositioned for comfort in recliner with k-pad on back. Also provided her with lavender to use via inhalation for pain mgt/relaxation. Pt left with all needs at session exit.   Therapy Documentation Precautions:  Precautions Precautions: Back Precaution Comments: reviewed 3/3 back precautions Required Braces or Orthoses: Spinal Brace Spinal Brace: Lumbar corset Spinal Brace Comments: lumbar brace from home, applied in sitting Restrictions Weight Bearing Restrictions: No Pain: Pain Assessment Pain Scale: 0-10 Pain Score: 5  Pain Type: Acute pain Pain Location: Back Pain Orientation: Right;Lower Pain Descriptors / Indicators: Aching;Discomfort Pain Frequency: Intermittent Pain Onset: On-going Patients Stated Pain Goal: 2 Pain Intervention(s): Medication (See eMAR) ADL:       Therapy/Group: Individual Therapy  Aquilla Voiles A Donique Hammonds 07/31/2018, 12:17 PM

## 2018-08-02 ENCOUNTER — Inpatient Hospital Stay (HOSPITAL_COMMUNITY): Payer: Self-pay | Admitting: Physical Therapy

## 2018-08-02 ENCOUNTER — Inpatient Hospital Stay (HOSPITAL_COMMUNITY): Payer: Self-pay | Admitting: Occupational Therapy

## 2018-08-02 LAB — CBC WITH DIFFERENTIAL/PLATELET
Abs Immature Granulocytes: 0 10*3/uL (ref 0.0–0.1)
Basophils Absolute: 0 10*3/uL (ref 0.0–0.1)
Basophils Relative: 1 %
EOS PCT: 1 %
Eosinophils Absolute: 0 10*3/uL (ref 0.0–0.7)
HEMATOCRIT: 37.1 % (ref 36.0–46.0)
HEMOGLOBIN: 11.7 g/dL — AB (ref 12.0–15.0)
Immature Granulocytes: 1 %
LYMPHS ABS: 1.8 10*3/uL (ref 0.7–4.0)
LYMPHS PCT: 29 %
MCH: 29.3 pg (ref 26.0–34.0)
MCHC: 31.5 g/dL (ref 30.0–36.0)
MCV: 93 fL (ref 78.0–100.0)
MONO ABS: 0.4 10*3/uL (ref 0.1–1.0)
MONOS PCT: 6 %
Neutro Abs: 3.9 10*3/uL (ref 1.7–7.7)
Neutrophils Relative %: 62 %
Platelets: 260 10*3/uL (ref 150–400)
RBC: 3.99 MIL/uL (ref 3.87–5.11)
RDW: 15.2 % (ref 11.5–15.5)
WBC: 6.1 10*3/uL (ref 4.0–10.5)

## 2018-08-02 LAB — BASIC METABOLIC PANEL
Anion gap: 5 (ref 5–15)
BUN: 14 mg/dL (ref 6–20)
CHLORIDE: 102 mmol/L (ref 98–111)
CO2: 28 mmol/L (ref 22–32)
CREATININE: 0.85 mg/dL (ref 0.44–1.00)
Calcium: 8.4 mg/dL — ABNORMAL LOW (ref 8.9–10.3)
GFR calc Af Amer: >60 mL/min (ref >60)
GFR calc non Af Amer: >60 mL/min (ref >60)
Glucose, Bld: 111 mg/dL — ABNORMAL HIGH (ref 70–99)
POTASSIUM: 3.8 mmol/L (ref 3.5–5.1)
Sodium: 135 mmol/L (ref 135–145)

## 2018-08-02 NOTE — Progress Notes (Signed)
[facility]IC[facility] & RE[facility]ABILITATION     PROGRESS NOTE    Subjective/Complaints: No new complaints today. Excited about progress and getting home. Pain controlled  ROS: Patient denies fever, rash, sore throat, blurred vision, nausea, vomiting, diarrhea, cough, shortness of breath or chest pain,   headache, or mood change.   Objective:  No results found. Recent Labs    08/02/18 0721  WBC 6.1  HGB 11.7*  HCT 37.1  PLT 260   Recent Labs    08/02/18 0721  NA 135  K 3.8  CL 102  GLUCOSE 111*  BUN 14  CREATININE 0.85  CALCIUM 8.4*   CBG (last 3)  No results for input(s): GLUCAP in the last 72 hours.  Wt Readings from Last 3 Encounters:  07/28/18 66 kg  07/12/18 66.7 kg  07/05/18 68 kg     Intake/Output Summary (Last 24 hours) at 08/02/2018 0846 Last data filed at 08/02/2018 0815 Gross per 24 hour  Intake 840 ml  Output -  Net 840 ml    Vital Signs: Blood pressure 136/77, pulse 68, temperature (!) 97.5 F (36.4 C), temperature source Oral, resp. rate 17, height 5\' 5"  (1.651 m), weight 66 kg, SpO2 98 %. Physical Exam:  Constitutional: No distress . Vital signs reviewed. HEENT: EOMI, oral membranes moist Neck: supple Cardiovascular: RRR without murmur. No JVD    Respiratory: CTA Bilaterally without wheezes or rales. Normal effort    GI: BS +, non-tender, non-distended  Musculoskeletal:LB TTP.   Neurological: Alert and oriented Motor: B/l UE 5/5.  RLE: 2/5 proximal to distal, inconsistent effort LLE: 4/5 proximal to distal Skin:intact Psychiatric: Anxious  Assessment/Plan: 1. Functional deficits secondary to lumbar radiculopathy which require 3+ hours per day of interdisciplinary therapy in a comprehensive inpatient rehab setting. Physiatrist is providing close team supervision and 24 hour management of active medical problems listed below. Physiatrist and rehab team continue to assess barriers to discharge/monitor patient progress toward  functional and medical goals.  Function:  Bathing Bathing position   Position: Shower  Bathing parts Body parts bathed by patient: Right arm, Left arm, Chest, Abdomen, Front perineal area, Right upper leg, Left upper leg, Buttocks, Right lower leg, Left lower leg Body parts bathed by helper: Buttocks, Right lower leg, Left lower leg, Back  Bathing assist Assist Level: Supervision or verbal cues      Upper Body Dressing/Undressing Upper body dressing   What is the patient wearing?: Pull over shirt/dress, Bra Bra - Perfomed by patient: Thread/unthread right bra strap, Thread/unthread left bra strap, Hook/unhook bra (pull down sports bra)   Pull over shirt/dress - Perfomed by patient: Thread/unthread right sleeve, Thread/unthread left sleeve, Put head through opening, Pull shirt over trunk          Upper body assist Assist Level: Set up, Supervision or verbal cues   Set up : To obtain clothing/put away  Lower Body Dressing/Undressing Lower body dressing   What is the patient wearing?: Pants, Non-skid slipper socks, Underwear Underwear - Performed by patient: Thread/unthread left underwear leg, Pull underwear up/down, Thread/unthread right underwear leg Underwear - Performed by helper: Thread/unthread right underwear leg Pants- Performed by patient: Thread/unthread left pants leg, Pull pants up/down, Thread/unthread right pants leg Pants- Performed by helper: Thread/unthread right pants leg Non-skid slipper socks- Performed by patient: Don/doff left sock, Don/doff right sock Non-skid slipper socks- Performed by helper: Don/doff right sock, Don/doff left sock  Lower body assist Assist for lower body dressing: Touching or steadying assistance (Pt > 75%)      Toileting Toileting   Toileting steps completed by patient: Adjust clothing prior to toileting, Performs perineal hygiene, Adjust clothing after toileting   Toileting Assistive Devices: Grab bar or rail   Toileting assist Assist level: Touching or steadying assistance (Pt.75%)   Transfers Chair/bed transfer   Chair/bed transfer method: Ambulatory Chair/bed transfer assist level: Touching or steadying assistance (Pt > 75%) Chair/bed transfer assistive device: Walker, Orthosis, Armrests     Locomotion Ambulation     Max distance: 40 Assist level: Touching or steadying assistance (Pt > 75%)   Wheelchair   Type: Manual Max wheelchair distance: 150' Assist Level: No help, No cues, assistive device, takes more than reasonable amount of time  Cognition Comprehension Comprehension assist level: Follows complex conversation/direction with extra time/assistive device  Expression Expression assist level: Expresses complex ideas: With extra time/assistive device  Social Interaction Social Interaction assist level: Interacts appropriately with others with medication or extra time (anti-anxiety, antidepressant).  Problem Solving Problem solving assist level: Solves complex problems: With extra time  Memory Memory assist level: More than reasonable amount of time  Medical Problem List and Plan: 1.Decreased functional mobilitysecondary to acute onchronic lumbar radiculopathy and foraminal stenosis bilaterally with decompression May 2019. Recent fall with exacerbation of pain and motor or sensory deficits.  --received ESI 9/25  Cont CIR, Mod I in room 2. DVT Prophylaxis/Anticoagulation: SCDs. 3. Pain Management:Neurontin: titrated to 400mg  tid  -continue Robaxin 500 mg 3 times daily, Ultram 50 mg every 12 hours, oxycodone as needed -left knee pain     -continue voltaren gel, prn ice---both helpful 4. Mood:Ativan 0.25-0.5 mg daily as needed  -continued ego support provided 5. Neuropsych: This patientiscapable of making decisions on herown behalf. 6. Skin/Wound Care:Routine skin checks 7. Fluids/Electrolytes/Nutrition:reasonable PO intake at  present  I personally reviewed the patient's labs today.  normal 8.Hypertension. Coreg 6.25 mg twice daily. Monitor with increased mobility  Controlled on 10/7 9.Hyperlipidemia. Lipitor 20 mg daily. 10.Constipation. Laxative assistance.   -is moving bowels regularly  -?Gi spasms/IBS----levsin PRN added and helpful 11.  Acute blood loss anemia     -I personally reviewed all of the patient's labs today, and lab work is within accetable range   LOS (Days) [place] EVALUATION WAS PERFORMED  Meredith Staggers, MD 08/02/2018 8:46 AM

## 2018-08-02 NOTE — Progress Notes (Signed)
Physical Therapy Discharge Summary  Patient Details  Name: [name] MRN: [id number] Date of Birth: [birth year]  Today's Date: 08/02/2018 PT Individual Time: 0900-1000 and 1445-1540 PT Individual Time Calculation (min): 60 min and 55 min   Patient has met 8 of 9 long term goals due to improved activity tolerance, improved balance, improved postural control, increased strength, improved attention, improved awareness and improved coordination.  Patient to discharge at an ambulatory level supervision/mod I.   Patient's care partner is independent to provide the necessary supervision assistance at discharge.  Reasons goals not met:  Pt continues to require supervision and verbal cues for ambulation in community settings.  Recommending use of w/c in community for the time being.   Recommendation:  Patient will benefit from ongoing skilled PT services in home health setting to continue to advance safe functional mobility, address ongoing impairments in balance, coordination, postural control, strength, and safety awareness, and minimize fall risk.  Equipment: No equipment provided  Reasons for discharge: treatment goals met and discharge from hospital  Patient/family agrees with progress made and goals achieved: Yes   Skilled PT Intervention: Session 1: no c/o pain at start of session, pain 4/10 by end of session, declines intervention.  Session focus on d/c assessment and progression of independence with functional mobility.  Pt able to perform all basic transfers with mod I using RW, initially requiring supervision with verbal cues for car transfer but on second attempt able to complete mod I.  Ambulation up to 130' with RW, initially mod I but progressing to requiring close supervision with verbal cues for reliance on LEs rather than UEs and safety with RW with fatigue.  Stair negotiation with supervision and min verbal cues for sequencing.  W/C propulsion throughout unit, max distance  300', mod I.  Pt returned to room at end of session and positioned in recliner with call bell in reach and needs met.   Session 2: c/o soreness but states she'll get medication at end of session.  Session focus on gait training and activity tolerance.  Pt with no buckling or "bucking" of LEs noted throughout session. Stair negotiation 2x4 steps with 2 handrails and supervision with no rest breaks between trials.  Ambulation up/down ramp and across compliant surfaces with BUE support and min assist overall for pt comfort.  W/C propulsion throughout unit mod I, max distance 300'.  Nustep x10 minutes at level 4 for reciprocal stepping pattern retraining, forced use, and overall activity tolerance.  Gait training back to room x160' with RW, mod I.  Pt positioned in recliner with call bell in reach and needs met.   PT Discharge Precautions/Restrictions Precautions Precautions: Back Required Braces or Orthoses: Spinal Brace Spinal Brace: Lumbar corset Spinal Brace Comments: lumbar brace from home, applied in sitting Restrictions Weight Bearing Restrictions: No Pain Pain Assessment Pain Scale: 0-10 Pain Score: 4  Pain Type: Acute pain Pain Location: Back Pain Orientation: Mid Pain Descriptors / Indicators: Aching Pain Onset: Gradual Patients Stated Pain Goal: 1 Pain Intervention(s): Repositioned;Emotional support Multiple Pain Sites: No Vision/Perception  Perception Perception: Within Functional Limits Praxis Praxis: Intact  Cognition Overall Cognitive Status: Within Functional Limits for tasks assessed Arousal/Alertness: Awake/alert Orientation Level: Oriented X4 Memory: Appears intact Awareness: Impaired Awareness Impairment: Emergent impairment Problem Solving: Impaired Problem Solving Impairment: Functional basic Safety/Judgment: Appears intact Sensation Sensation Light Touch: Impaired Detail Peripheral sensation comments: decreased sensation to light touch below ankle on RLE,  baseline since May  Light Touch Impaired Details: Impaired RLE  Proprioception: Impaired by gross assessment Coordination Gross Motor Movements are Fluid and Coordinated: No Fine Motor Movements are Fluid and Coordinated: Yes Coordination and Movement Description: continues to have some ataxia during swing phase, BLEs Heel Shin Test: WFL on LLE, uses UEs to position RLE but then able to slide from ankle>knee without UE assist (though ataxic) Motor  Motor Motor: Ataxia;Abnormal postural alignment and control Motor - Discharge Observations: ongoing enduranec deficits 2/2 pain  Mobility Bed Mobility Bed Mobility: Rolling Right;Right Sidelying to Sit Rolling Right: Independent Right Sidelying to Sit: Independent Transfers Transfers: Stand Pivot Transfers;Sit to Stand;Stand to Sit Sit to Stand: Independent with assistive device Stand to Sit: Independent with assistive device Stand Pivot Transfers: Independent with assistive device Transfer (Assistive device): Rolling walker Locomotion  Gait Ambulation: Yes Gait Assistance: Supervision/Verbal cueing Gait Distance (Feet): 130 Feet Assistive device: Rolling walker Gait Assistance Details: Verbal cues for precautions/safety Gait Assistance Details: needs cues for having confidenec in LE strength and safety with RW as pt tends to pause or keep pushing RW with single UE to scratch her nose, or take rest breaks with LEs in split stance Gait Gait: Yes Gait Pattern: Impaired Gait Pattern: Step-through pattern;Decreased step length - right;Decreased step length - left;Decreased dorsiflexion - right;Poor foot clearance - left;Poor foot clearance - right Stairs / Additional Locomotion Stairs: Yes Stairs Assistance: Supervision/Verbal cueing Stair Management Technique: Two rails Number of Stairs: 4 Height of Stairs: 6 Wheelchair Mobility Wheelchair Mobility: Yes Wheelchair Assistance: Independent with Camera operator:  Both upper extremities Wheelchair Parts Management: Supervision/cueing Distance: 300  Trunk/Postural Assessment  Cervical Assessment Cervical Assessment: Within Functional Limits Thoracic Assessment Thoracic Assessment: Within Functional Limits Lumbar Assessment Lumbar Assessment: (lumbar brace) Postural Control Postural Control: Deficits on evaluation Protective Responses: intact Postural Limitations: decreased but improved since eval  Balance Balance Balance Assessed: Yes Static Sitting Balance Static Sitting - Balance Support: No upper extremity supported;Feet supported Static Sitting - Level of Assistance: 6: Modified independent (Device/Increase time) Dynamic Sitting Balance Dynamic Sitting - Balance Support: Left upper extremity supported;Right upper extremity supported;Feet supported;During functional activity Dynamic Sitting - Level of Assistance: 6: Modified independent (Device/Increase time) Static Standing Balance Static Standing - Balance Support: Bilateral upper extremity supported;During functional activity Static Standing - Level of Assistance: 6: Modified independent (Device/Increase time) Dynamic Standing Balance Dynamic Standing - Balance Support: Bilateral upper extremity supported;During functional activity Dynamic Standing - Level of Assistance: 6: Modified independent (Device/Increase time) Extremity Assessment  RUE Assessment RUE Assessment: Within Functional Limits LUE Assessment LUE Assessment: Within Functional Limits RLE Assessment Passive Range of Motion (PROM) Comments: WFL Active Range of Motion (AROM) Comments: WFL General Strength Comments: baseline foot drop RLE Strength Right Hip Flexion: 3/5 Right Knee Flexion: 3/5 Right Knee Extension: 3/5 LLE Assessment Passive Range of Motion (PROM) Comments: WFL Active Range of Motion (AROM) Comments: WFL assessed in sitting LLE Strength Left Hip Flexion: 3+/5 Left Knee Flexion: 3+/5 Left Knee  Extension: 3+/5 Left Ankle Dorsiflexion: 3+/5 Left Ankle Plantar Flexion: 3+/5    Michel Santee 08/02/2018, 9:51 AM

## 2018-08-02 NOTE — Discharge Summary (Signed)
Discharge summary job [id number]

## 2018-08-02 NOTE — Progress Notes (Signed)
Occupational Therapy Discharge Summary  Patient Details  Name: [name] MRN: [id number] Date of Birth: October 05, 1961  Today's Date: 08/02/2018 OT Individual Time: 9518-8416 OT Individual Time Calculation (min): 69 min    Patient has met 10 of 10 long term goals due to improved activity tolerance, improved balance, ability to compensate for deficits, improved awareness and improved coordination.  Patient to discharge at overall mod I and supervision for shower transfer level.    Reasons goals not met: all goals met  Recommendation:  Patient will benefit from ongoing skilled OT services in home health setting to continue to advance functional skills in the area of BADL and iADL.  Equipment: No equipment provided  Reasons for discharge: treatment goals met  Patient/family agrees with progress made and goals achieved: Yes   OT Intervention: Upon entering the room, pt supine in bed with no c/o pain this session but reports soreness. Pt agreeable to OT intervention and gathering items herself to ambulating into bathroom with use of RW and mod I level. Pt seated on TTB and doffing clothing for safety. Shower completed while seated on TTB with use of LH sponge to increase independence. Pt ambulating from shower to sit on EOB for dressing tasks at overall mod I level. Pt utilized right 4 position to thread clothing onto B feet. Pt performed squat pivot into wheelchair and propelled self to sink for grooming tasks at mod I level. Pt transferring into recliner chair with B LEs elevated at end of session. Call bell and all needed items within reach. Pt made Mod I in room.   OT Discharge Precautions/Restrictions  Precautions Precautions: Back Required Braces or Orthoses: Spinal Brace Spinal Brace: Lumbar corset Spinal Brace Comments: lumbar brace from home, applied in sitting Restrictions Weight Bearing Restrictions: No General   Vital Signs Therapy Vitals Temp: (!) 97.5 F (36.4  C) Temp Source: Oral Pulse Rate: 68 Resp: 17 BP: 136/77 Patient Position (if appropriate): Lying Oxygen Therapy SpO2: 98 % O2 Device: Room Air Pain Pain Assessment Pain Scale: 0-10 Pain Score: 4  Pain Type: Acute pain Pain Location: Back Pain Orientation: Medial Pain Descriptors / Indicators: Aching Pain Onset: Gradual Patients Stated Pain Goal: 1 Pain Intervention(s): Repositioned Multiple Pain Sites: No Vision Baseline Vision/History: Wears glasses Wears Glasses: Reading only Patient Visual Report: No change from baseline Vision Assessment?: No apparent visual deficits Cognition Overall Cognitive Status: Within Functional Limits for tasks assessed Arousal/Alertness: Awake/alert Orientation Level: Oriented X4 Sensation Coordination Fine Motor Movements are Fluid and Coordinated: Yes Motor  Motor Motor - Discharge Observations: improvements since eval but continued decrease in endurance due to pain Mobility  Bed Mobility Bed Mobility: Rolling Right;Right Sidelying to Sit Rolling Right: Independent Right Sidelying to Sit: Independent  Trunk/Postural Assessment  Cervical Assessment Cervical Assessment: Within Functional Limits Thoracic Assessment Thoracic Assessment: Within Functional Limits Lumbar Assessment Lumbar Assessment: Exceptions to [facility]  Balance Balance Balance Assessed: Yes Static Sitting Balance Static Sitting - Balance Support: No upper extremity supported;Feet supported Static Sitting - Level of Assistance: 6: Modified independent (Device/Increase time) Dynamic Sitting Balance Dynamic Sitting - Balance Support: Left upper extremity supported;Right upper extremity supported;Feet supported;During functional activity Dynamic Sitting - Level of Assistance: 6: Modified independent (Device/Increase time) Static Standing Balance Static Standing - Balance Support: Bilateral upper extremity supported;During functional activity Static Standing - Level of  Assistance: 6: Modified independent (Device/Increase time) Dynamic Standing Balance Dynamic Standing - Balance Support: Bilateral upper extremity supported;During functional activity Dynamic Standing - Level of Assistance:  6: Modified independent (Device/Increase time) Extremity/Trunk Assessment RUE Assessment RUE Assessment: Within Functional Limits LUE Assessment LUE Assessment: Within Functional Limits   [name] 08/02/2018, 8:17 AM

## 2018-08-02 NOTE — Plan of Care (Signed)
Pt continues to require supervision for obstacle navigation when ambulating in community settings

## 2018-08-02 NOTE — Discharge Summary (Signed)
NAME: [name], [name] MEDICAL RECORD [id number] ACCOUNT [id number] DATE OF BIRTH:23-Aug-1961 FACILITY: [facility] LOCATION: [facility] PHYSICIAN:ZACHARY SWARTZ, MD  DISCHARGE SUMMARY  DATE OF DISCHARGE:  08/03/2018  DISCHARGE DIAGNOSES: 1.  Decreased functional mobility secondary to acute on chronic lumbar radiculopathy and foraminal stenosis with decompression May of 2019 with recent fall, exacerbation of pain and motor or sensory deficits. 2.  Sequential compression devices for deep venous thrombosis prophylaxis. 3.  Pain management. 4.  Mood.   5.  Hypertension. 6.  Hyperlipidemia.   7.  Constipation. 8.  Acute blood loss anemia.  HOSPITAL COURSE:  This is a 57 year old right-handed female with history of hypertension, chronic back pain, lumbar decompression 02/2018 by [name] receiving inpatient rehabilitation services.  Lives with spouse.  I used a cane prior to admission and  a right AFO.  Presented 07/12/2018 by report.  Was back at work part time sitting on a rolling wheelchair when it slid out from under her, fell to the floor, landing on her buttocks.  Denied loss of consciousness.  MRI lumbar spine showed no fracture or  malalignment.  Noted small annular tear at L4-L5 with small central disk protrusion.  CT thoracic spine unremarkable.  Follow up orthopedic services conservative care.  Plan was for steroid injection at L4-L5.  HOSPITAL COURSE:  Pain management.  The patient was admitted for comprehensive rehabilitation program.  PAST MEDICAL HISTORY:  See discharge diagnoses.  SOCIAL HISTORY:  Lives with spouse.  I used a cane prior to admission.  FUNCTIONAL STATUS:  Upon admission to rehab services was minimal assist 10 feet rolling walker, moderate assist sit to stand, min mod assist with activities of daily living.  PHYSICAL EXAMINATION: VITAL SIGNS:  Blood pressure 174/91, pulse 66, temperature 97, respirations 18. GENERAL:  Alert female in no acute distress. HEENT:   EOMs intact. NECK:  Supple, nontender, no JVD. CARDIOVASCULAR:  Rate controlled. ABDOMEN:  Soft, nontender, good bowel sounds. LUNGS:  Clear to auscultation without wheeze.   EXTREMITIES:  Limited flexion and extension while seated in bed. Upper extremities  5/5;  lower extremities inconsistent due to pain.  Inconsistent sensory loss bilateral lower extremities.  REHABILITATION HOSPITAL COURSE:  The patient was admitted to inpatient rehabilitation services.  Therapies initiated on a 3-hour daily basis, consisting of physical therapy, occupational therapy and rehabilitation nursing.  The following issues were  addressed during patient's rehabilitation stay.  Pertaining to the patient's chronic lumbar radiculopathy, she had undergone decompression in May.  She would follow up with [name].    Recent fall with exacerbation of pain, doing well with the use of Neurontin titrated to 400 mg t.i.d., Robaxin as advised with scheduled Ultram and oxycodone for breakthrough pain.  SCDs for DVT prophylaxis.    Mood stabilization.  Ativan as needed.  Emotional support provided.    Blood pressure is controlled on Coreg.  She would follow up with her primary MD.  Bouts of constipation, resolved with laxative assistance.  The patient received weekly collaborative interdisciplinary team conferences to discuss estimated length of stay,  family teaching, any barriers to her discharge.    The patient ambulating 75 feet contact guard assist, rolling walker, supervision for obstacle negotiations.  Propels her wheelchair to her room with supervision minimal assist squat pivot transfers.  Gathered belongings for activities of daily living and  homemaking.  Full family teaching was completed and planned discharge to home.  DISCHARGE MEDICATIONS:  Included Lipitor 20 mg p.o. daily, Coreg 6.25 mg p.o. b.i.d.,  Voltaren gel 3 times a day to affected area.  Premarin 0.625 mg daily, Neurontin 400 mg p.o. t.i.d., Robaxin 500  mg p.o. t.i.d., MiraLax twice daily, Senokot-S 1 tablet  p.o. b.i.d., Ultram 50 mg p.o. every 12 hours.  Ativan 0.5 mg daily as needed, oxycodone 1-2 tablets every 4 hours as needed for pain.  Her diet was regular.    FOLLOWUP:  She would follow up with Dr. Alger Simons at the outpatient rehab service office as directed; Dr. [name], call for appointment; Dr. Inez Catalina medical management.  AN/NUANCE D:08/02/2018 T:08/02/2018 JOB:[id number]

## 2018-08-03 MED ORDER — ATORVASTATIN CALCIUM 20 MG PO TABS: 20.0000 mg | ORAL_TABLET | Freq: Every day | ORAL | 3 refills | Status: DC

## 2018-08-03 MED ORDER — INFLUENZA VAC SPLIT QUAD 0.5 ML IM SUSY
0.5000 mL | PREFILLED_SYRINGE | INTRAMUSCULAR | Status: AC | PRN
  Administered 2018-08-03: 0.5 mL via INTRAMUSCULAR

## 2018-08-03 MED ORDER — OXYCODONE-ACETAMINOPHEN 5-325 MG PO TABS: 1.0000 | ORAL_TABLET | ORAL | 0 refills | Status: DC | PRN

## 2018-08-03 MED ORDER — METHOCARBAMOL 500 MG PO TABS: 500.0000 mg | ORAL_TABLET | Freq: Three times a day (TID) | ORAL | 0 refills | Status: DC

## 2018-08-03 MED ORDER — TRAMADOL HCL 50 MG PO TABS: 50.0000 mg | ORAL_TABLET | Freq: Two times a day (BID) | ORAL | 0 refills | Status: DC

## 2018-08-03 MED ORDER — LORAZEPAM 0.5 MG PO TABS: 0.2500 mg | ORAL_TABLET | Freq: Every day | ORAL | 1 refills | Status: DC | PRN

## 2018-08-03 MED ORDER — DICLOFENAC SODIUM 1 % TD GEL: 2.0000 g | Freq: Three times a day (TID) | TRANSDERMAL | 1 refills | Status: DC

## 2018-08-03 MED ORDER — GABAPENTIN 400 MG PO CAPS: 400.0000 mg | ORAL_CAPSULE | Freq: Three times a day (TID) | ORAL | 1 refills | Status: DC

## 2018-08-03 MED ORDER — CARVEDILOL 6.25 MG PO TABS: 6.2500 mg | ORAL_TABLET | Freq: Two times a day (BID) | ORAL | 1 refills | Status: DC

## 2018-08-03 MED ORDER — BISACODYL 10 MG RE SUPP: 10.0000 mg | Freq: Every day | RECTAL | 0 refills | Status: DC | PRN

## 2018-08-03 MED FILL — GABAPENTIN 400 MG CAPSULE: 400 | 30 days supply | Qty: 90 | Fill #0

## 2018-08-03 MED FILL — ATORVASTATIN CALCIUM 20 MG: 20 | 90 days supply | Qty: 90 | Fill #0

## 2018-08-03 MED FILL — CARVEDILOL 6.25 MG TABLET: 6.25 | 90 days supply | Qty: 180 | Fill #0

## 2018-08-03 MED FILL — DICLOFENAC SODIUM 1% GEL: 1 | 16 days supply | Qty: 100 | Fill #0

## 2018-08-03 MED FILL — traMADol HCL 50 MG TABS: 50 | 7 days supply | Qty: 14 | Fill #0

## 2018-08-03 MED FILL — OXYCODONE-ACETAMINOPHEN 5-3: 5-325 | 3 days supply | Qty: 30 | Fill #0

## 2018-08-03 MED FILL — LORazepam 0.5 MG TABS: 0.5 | 30 days supply | Qty: 30 | Fill #0

## 2018-08-03 MED FILL — METHOCARBAMOL 500 MG TABS: 500 | 30 days supply | Qty: 90 | Fill #0

## 2018-08-03 NOTE — Discharge Instructions (Signed)
Inpatient Rehab Discharge Instructions  [name] Discharge date and time: No discharge date for patient encounter.   Activities/Precautions/ Functional Status: Activity: activity as tolerated Diet: regular diet Wound Care: none needed Functional status:  ___ No restrictions     ___ Walk up steps independently ___ 24/7 supervision/assistance   ___ Walk up steps with assistance ___ Intermittent supervision/assistance  ___ Bathe/dress independently ___ Walk with walker     _x__ Bathe/dress with assistance ___ Walk Independently    ___ Shower independently ___ Walk with assistance    ___ Shower with assistance ___ No alcohol     ___ Return to work/school ________     COMMUNITY REFERRALS UPON DISCHARGE:      All follow up therapy appointments will be ordered and arranged via [facility] @ [phone]      Special Instructions:    My questions have been answered and I understand these instructions. I will adhere to these goals and the provided educational materials after my discharge from the hospital.  Patient/Caregiver Signature _______________________________ Date __________  Clinician Signature _______________________________________ Date __________  Please bring this form and your medication list with you to all your follow-up doctor's appointments.

## 2018-08-03 NOTE — Progress Notes (Signed)
[place] PHYSICAL MEDICINE & REHABILITATION     PROGRESS NOTE    Subjective/Complaints: No new complaints today. Excited about progress and getting home. Pain controlled  ROS: Patient denies fever, rash, sore throat, blurred vision, nausea, vomiting, diarrhea, cough, shortness of breath or chest pain,   headache, or mood change.   Objective:  No results found. Recent Labs    08/02/18 0721  WBC 6.1  HGB 11.7*  HCT 37.1  PLT 260   Recent Labs    08/02/18 0721  NA 135  K 3.8  CL 102  GLUCOSE 111*  BUN 14  CREATININE 0.85  CALCIUM 8.4*   CBG (last 3)  No results for input(s): GLUCAP in the last 72 hours.  Wt Readings from Last 3 Encounters:  07/28/18 66 kg  07/12/18 66.7 kg  07/05/18 68 kg     Intake/Output Summary (Last 24 hours) at 08/03/2018 0838 Last data filed at 08/03/2018 0826 Gross per 24 hour  Intake 702 ml  Output -  Net 702 ml    Vital Signs: Blood pressure (!) 147/82, pulse 65, temperature 97.7 F (36.5 C), temperature source Oral, resp. rate 18, height 5\' 5"  (1.651 m), weight 66 kg, SpO2 100 %. Physical Exam:  Constitutional: No distress . Vital signs reviewed. HEENT: EOMI, oral membranes moist Neck: supple Cardiovascular: RRR without murmur. No JVD    Respiratory: CTA Bilaterally without wheezes or rales. Normal effort    GI: BS +, non-tender, non-distended  Musculoskeletal:LB TTP.   Neurological: Alert and oriented Motor: B/l UE 5/5.  RLE: 2/5 proximal to distal, inconsistent effort LLE: 4/5 proximal to distal Skin:intact Psychiatric: Anxious  Assessment/Plan: 1. Functional deficits secondary to lumbar radiculopathy which require 3+ hours per day of interdisciplinary therapy in a comprehensive inpatient rehab setting. Physiatrist is providing close team supervision and 24 hour management of active medical problems listed below. Physiatrist and rehab team continue to assess barriers to discharge/monitor patient progress toward  functional and medical goals.  Function:  Bathing Bathing position   Position: Shower  Bathing parts Body parts bathed by patient: Right arm, Left arm, Chest, Abdomen, Front perineal area, Right upper leg, Left upper leg, Buttocks, Right lower leg, Left lower leg Body parts bathed by helper: Buttocks, Right lower leg, Left lower leg, Back  Bathing assist Assist Level: Supervision or verbal cues      Upper Body Dressing/Undressing Upper body dressing   What is the patient wearing?: Pull over shirt/dress, Bra Bra - Perfomed by patient: Thread/unthread right bra strap, Thread/unthread left bra strap, Hook/unhook bra (pull down sports bra)   Pull over shirt/dress - Perfomed by patient: Thread/unthread right sleeve, Thread/unthread left sleeve, Put head through opening, Pull shirt over trunk          Upper body assist Assist Level: Set up, Supervision or verbal cues   Set up : To obtain clothing/put away  Lower Body Dressing/Undressing Lower body dressing   What is the patient wearing?: Pants, Non-skid slipper socks, Underwear Underwear - Performed by patient: Thread/unthread left underwear leg, Pull underwear up/down, Thread/unthread right underwear leg Underwear - Performed by helper: Thread/unthread right underwear leg Pants- Performed by patient: Thread/unthread left pants leg, Pull pants up/down, Thread/unthread right pants leg Pants- Performed by helper: Thread/unthread right pants leg Non-skid slipper socks- Performed by patient: Don/doff left sock, Don/doff right sock Non-skid slipper socks- Performed by helper: Don/doff right sock, Don/doff left sock  Lower body assist Assist for lower body dressing: Touching or steadying assistance (Pt > 75%)      Toileting Toileting   Toileting steps completed by patient: Adjust clothing prior to toileting, Performs perineal hygiene, Adjust clothing after toileting   Toileting Assistive Devices: Grab bar or rail   Toileting assist Assist level: Touching or steadying assistance (Pt.75%)   Transfers Chair/bed transfer   Chair/bed transfer method: Ambulatory Chair/bed transfer assist level: Touching or steadying assistance (Pt > 75%) Chair/bed transfer assistive device: Walker, Orthosis, Armrests     Locomotion Ambulation     Max distance: 40 Assist level: Touching or steadying assistance (Pt > 75%)   Wheelchair   Type: Manual Max wheelchair distance: 150' Assist Level: No help, No cues, assistive device, takes more than reasonable amount of time  Cognition Comprehension Comprehension assist level: Follows complex conversation/direction with extra time/assistive device  Expression Expression assist level: Expresses complex ideas: With extra time/assistive device  Social Interaction Social Interaction assist level: Interacts appropriately with others with medication or extra time (anti-anxiety, antidepressant).  Problem Solving Problem solving assist level: Solves complex problems: With extra time  Memory Memory assist level: More than reasonable amount of time  Medical Problem List and Plan: 1.Decreased functional mobilitysecondary to acute onchronic lumbar radiculopathy and foraminal stenosis bilaterally with decompression May 2019. Recent fall with exacerbation of pain and motor or sensory deficits.  --received ESI 9/25    Mod I in room  -dc home today  -Patient to see Rehab MD/provider in the office for transitional care encounter in 1-2 weeks.  2. DVT Prophylaxis/Anticoagulation: SCDs. 3. Pain Management:Neurontin: titrated to 400mg  tid  -continue Robaxin 500 mg 3 times daily, Ultram 50 mg every 12 hours, oxycodone as needed -left knee pain     -continue voltaren gel, prn ice-    -would like voltaren gel for home use 4. Mood:Ativan 0.25-0.5 mg daily as needed  -continued ego support provided 5. Neuropsych: This patientiscapable of making  decisions on herown behalf. 6. Skin/Wound Care:Routine skin checks 7. Fluids/Electrolytes/Nutrition:reasonable PO intake at present  I personally reviewed the patient's labs today.  normal 8.Hypertension. Coreg 6.25 mg twice daily. Monitor with increased mobility  Controlled on 10/8 9.Hyperlipidemia. Lipitor 20 mg daily. 10.Constipation. Laxative assistance.   -is moving bowels regularly  -?Gi spasms/IBS----levsin PRN added and helpful 11.  Acute blood loss anemia     -    LOS (Days) 14 A FACE TO FACE EVALUATION WAS PERFORMED  Meredith Staggers, MD 08/03/2018 8:38 AM

## 2018-08-03 NOTE — Plan of Care (Signed)
Pt d/c home  

## 2018-08-03 NOTE — Progress Notes (Signed)
Social Work  Discharge Note  The overall goal for the admission was met for:   Discharge location: Yes - home with spouse and son who can provide intermittent assistance  Length of Stay: Yes - 13 days  Discharge activity level: Yes - independent  Home/community participation: Yes  Services provided included: MD, RD, PT, OT, RN, Pharmacy and SW  Financial Services: Worker's Comp  Follow-up services arranged:  Per discussion with Sydnee Levans, Worker's Comp CM, she has made an appointment for pt tomorrow with [name] who will determine what f/u and DME pt may need and the referrals will be made at that time. Pt aware.  (CIR team felt no DME needs as pt had DME from prior d/c.  Pt prefers HH tx)  Comments (or additional information):  Patient/Family verbalized understanding of follow-up arrangements: Yes  Individual responsible for coordination of the follow-up plan: [name] and [facility] CM  Confirmed correct DME delivered: NA  Melaine Mcphee

## 2018-08-04 ENCOUNTER — Other Ambulatory Visit: Payer: Self-pay | Admitting: *Deleted

## 2018-08-04 ENCOUNTER — Telehealth: Payer: Self-pay | Admitting: *Deleted

## 2018-08-04 NOTE — Patient Outreach (Addendum)
[facility]) Care Management  08/04/2018  [name] [birth year] [id number]   Successful initial transition of care outreach and screening completed. See transition of care template for details.  [name] is a 57 y.o. female with medical history significant of back surgery on Feb 28, 2018, hypertensin,  Hyperlipidemia, anxiety, She was admitted to [facility] on 06/1618 after sustaining a fall at work when she was trying to sit on a rolling wheelchair, when it slid out from underneath her and she fell on her back .[name] was then admitted to the acute rehabilitation unit on 07/20/18 and discharged to home on 08/03/18. [name] states she is managing her back pain well with Tramadol, gabapentin and robaxin. She says she is also taking a daily stool softener and uses Miralax prn to prevent constipation. Her husband and a girlfriend who lives 4 doors down are helping her at home and with driving errands. [name] states she has a wheelchair, walker, bedside commode shower chair and cane.  She has follow up appointments with her surgeon today, her primary care physician on 10/21 and with Dr Naaman Plummer, acute rehabilitation physician, on 10/04/18. No further care management needs identified so will route successful outreach letter to [facility] clinical pool for mailing to [name] and close case to [facility] services.  Barrington Ellison RN,CCM,CDE [facility] Coordinator Office Phone [phone] Office Fax [phone]

## 2018-08-04 NOTE — Telephone Encounter (Signed)
Unable to reach patient at time of TCM Call. Left message for patient to return call when available.  

## 2018-08-04 NOTE — Telephone Encounter (Signed)
Pt returning your call.  Ok to call back, thanks

## 2018-08-05 ENCOUNTER — Other Ambulatory Visit: Payer: Self-pay | Admitting: *Deleted

## 2018-08-05 NOTE — Patient Outreach (Signed)
[facility]) Care Management  08/05/2018  ARNOLD DEPINTO 1961-07-07 [id number]   Discussed case during monthly [facility] Case Management Reviewwith[facility] RNCMs.  Update was provided including the following- Adela Lank was admitted to [facility] on 06/1618 after sustaining a fall at work when she was trying to sit on a rolling wheelchair, when it slid out from underneath her and she fell on her back .[name] was then admitted to the acute rehabilitation unit on 07/20/18 and discharged to home on 08/03/18 without home heath needs so case has been closed to [facility] services.  Barrington Ellison RN,CCM,CDE [facility] Coordinator Office Phone [phone] Office Fax [phone]

## 2018-08-05 NOTE — Telephone Encounter (Signed)
Transition Care Management Follow-up Telephone Call   Date discharged? 08/02/18   How have you been since you were released from the hospital? Still in a lot of pain   Do you understand why you were in the hospital? yes   Do you understand the discharge instructions? yes   Where were you discharged to? Pt was originally sent to in patient rehab but has now been discharged to home.  Items Reviewed:  Medications reviewed: yes  Allergies reviewed: yes  Dietary changes reviewed: yes  Referrals reviewed: yes   Functional Questionnaire:   Activities of Daily Living (ADLs):   She states they are independent in the following: ambulation, feeding, continence, grooming, toileting and dressing States they require assistance with the following: Pt states her husband must help her in the shower.  She has a walk in.   Any transportation issues/concerns?: Pt cannot drive right now.  Dependant on her husband.   Any patient concerns? no   Confirmed importance and date/time of follow-up visits scheduled yes  Provider Appointment booked with  Confirmed with patient if condition begins to worsen call PCP or go to the ER.  Patient was given the office number and encouraged to call back with question or concerns.  : yes

## 2018-08-12 MED FILL — FEMRING 0.05 MG/24HR RING: 0.05 | 84 days supply | Qty: 1 | Fill #0

## 2018-08-16 ENCOUNTER — Ambulatory Visit (INDEPENDENT_AMBULATORY_CARE_PROVIDER_SITE_OTHER): Payer: Self-pay | Admitting: Family Medicine

## 2018-08-16 ENCOUNTER — Inpatient Hospital Stay: Payer: 59 | Admitting: Physical Medicine & Rehabilitation

## 2018-08-16 ENCOUNTER — Encounter: Payer: Self-pay | Admitting: Family Medicine

## 2018-08-16 VITALS — BP 140/90 | HR 86 | Temp 98.0°F | Resp 12 | Ht 65.0 in | Wt 157.6 lb

## 2018-08-16 DIAGNOSIS — I10 Essential (primary) hypertension: Secondary | ICD-10-CM

## 2018-08-16 DIAGNOSIS — E785 Hyperlipidemia, unspecified: Secondary | ICD-10-CM

## 2018-08-16 DIAGNOSIS — F410 Panic disorder [episodic paroxysmal anxiety] without agoraphobia: Secondary | ICD-10-CM

## 2018-08-16 DIAGNOSIS — M5416 Radiculopathy, lumbar region: Secondary | ICD-10-CM

## 2018-08-16 DIAGNOSIS — F411 Generalized anxiety disorder: Secondary | ICD-10-CM

## 2018-08-16 NOTE — Progress Notes (Signed)
HPI:   [name] is a 57 y.o. female, who is here today for 6 months follow up.   She was last seen on 04/16/18.  Since her last OV she was hospitalized from 07/12/2018-07/20/2018 and from 08/02/2018-08/03/2018.   Hx of chronic back pain with radiculopathy, s/p lumbar decompression 02/2018. She went back to work part time, rolling wheelchair slid when she was about to sit,fell and landed on buttocks.   She is in "a lot of pain", received epidural ,which helped for a couple weeks. Doing PT and on Gabapentin 400 mg tid.  She is using a rolling walker.  She has an appt on 08/23/18 and she thinks she is hoping to have another epidural injection.  She is also on Tramadol and Percocet, otherwise well tolerated except for constipation.Dolculase and Miralax have helped.  Hypertension:  She has not been monitoring BP at home.  Currently on Coreg 6.25 mg twice daily. She is taking medications as instructed, no side effects reported. She has not noted unusual headache, visual changes, exertional chest pain, dyspnea,  focal weakness, or edema.   Lab Results  Component Value Date   CREATININE 0.85 08/02/2018   BUN 14 08/02/2018   NA 135 08/02/2018   K 3.8 08/02/2018   CL 102 08/02/2018   CO2 28 08/02/2018    HLD: Currently she is on non Atorvastatin 20 mg daily. She is tolerating medication well. She is following low fat diet as well.    Lab Results  Component Value Date   CHOL 287 (H) 04/19/2018   HDL 76.40 04/19/2018   LDLCALC 196 (H) 04/19/2018   TRIG 73.0 04/19/2018   CHOLHDL 4 04/19/2018   Anxiety:  She is on Lorazepam 0.5 mg daily prn. Medication prescribed by Dr Delano Metz. Denies depressed mood or suicidal thoughts.  Review of Systems  Constitutional: Positive for fatigue. Negative for activity change, appetite change and fever.  HENT: Negative for mouth sores, nosebleeds and trouble swallowing.   Eyes: Negative for redness and visual  disturbance.  Respiratory: Negative for cough, shortness of breath and wheezing.   Cardiovascular: Negative for chest pain, palpitations and leg swelling.  Gastrointestinal: Positive for constipation. Negative for abdominal pain, nausea and vomiting.  Genitourinary: Negative for decreased urine volume, dysuria and hematuria.  Musculoskeletal: Positive for back pain and gait problem.  Skin: Negative for rash.  Neurological: Negative for syncope, weakness and headaches.  Psychiatric/Behavioral: Negative for confusion. The patient is nervous/anxious.      Current Outpatient Medications on File Prior to Visit  Medication Sig Dispense Refill  . acetaminophen (TYLENOL) 325 MG tablet Take 1-2 tablets (325-650 mg total) by mouth every 4 (four) hours as needed for mild pain.    [name] acidophilus (RISAQUAD) CAPS capsule Take 1 capsule by mouth daily.    [name] atorvastatin (LIPITOR) 20 MG tablet Take 1 tablet (20 mg total) by mouth daily. 90 tablet 3  . bisacodyl (DULCOLAX) 10 MG suppository Place 1 suppository (10 mg total) rectally daily as needed for moderate constipation. 12 suppository 0  . carvedilol (COREG) 6.25 MG tablet Take 1 tablet (6.25 mg total) by mouth 2 (two) times daily. 180 tablet 1  . diclofenac sodium (VOLTAREN) 1 % GEL Apply 2 g topically 3 (three) times daily. 1 Tube 1  . docusate sodium (COLACE) 100 MG capsule Take 1 capsule (100 mg total) by mouth 2 (two) times daily. (Patient taking differently: Take 100 mg by mouth 2 (two) times daily as  needed for mild constipation. ) 60 capsule 1  . Estradiol Acetate (FEMRING) 0.05 MG/24HR RING Place vaginally.    [name] LORazepam (ATIVAN) 0.5 MG tablet Take 0.5-1 tablets (0.25-0.5 mg total) by mouth daily as needed for anxiety. 30 tablet 1  . methocarbamol (ROBAXIN) 500 MG tablet Take 1 tablet (500 mg total) by mouth 3 (three) times daily. 90 tablet 0  . polyethylene glycol (MIRALAX / GLYCOLAX) packet Take 17 g by mouth daily. 30 each 0   No current  facility-administered medications on file prior to visit.      Past Medical History:  Diagnosis Date  . Anxiety   . Back pain    related to spinal stenosis and disc problem, radiates down left buttocks to leg., weakness occ.  [name] Dyspnea   . Headache   . Hyperlipidemia   . Hypertension   . PONV (postoperative nausea and vomiting)   . Vaginal foreign object    "Uses Femring"   Allergies  Allergen Reactions  . Cephalosporins Anaphylaxis  . Penicillins Anaphylaxis and Hives    Has patient had a PCN reaction causing immediate rash, facial/tongue/throat swelling, SOB or lightheadedness with hypotension: Yes Has patient had a PCN reaction causing severe rash involving mucus membranes or skin necrosis: Yes Has patient had a PCN reaction that required hospitalization Yes Has patient had a PCN reaction occurring within the last 10 years: No If all of the above answers are "NO", then may proceed with Cephalosporin use.   . Anesthetics, Amide Nausea And Vomiting    Does not know name of it. States they put it on record foot center.   [name] Peach [Prunus Persica] Hives  . Latex Hives, Itching and Rash    Social History   Socioeconomic History  . Marital status: Married    Spouse name: Not on file  . Number of children: Not on file  . Years of education: Not on file  . Highest education level: Not on file  Occupational History  . Not on file  Social Needs  . Financial resource strain: Not on file  . Food insecurity:    Worry: Not on file    Inability: Not on file  . Transportation needs:    Medical: Not on file    Non-medical: Not on file  Tobacco Use  . Smoking status: Never Smoker  . Smokeless tobacco: Never Used  Substance and Sexual Activity  . Alcohol use: No  . Drug use: No  . Sexual activity: Yes    Partners: Male    Birth control/protection: Post-menopausal  Lifestyle  . Physical activity:    Days per week: Not on file    Minutes per session: Not on file  .  Stress: Not on file  Relationships  . Social connections:    Talks on phone: Not on file    Gets together: Not on file    Attends religious service: Not on file    Active member of club or organization: Not on file    Attends meetings of clubs or organizations: Not on file    Relationship status: Not on file  Other Topics Concern  . Not on file  Social History Narrative  . Not on file    Vitals:   08/16/18 1052  BP: 140/90  Pulse: 86  Resp: 12  Temp: 98 F (36.7 C)  SpO2: 97%   Body mass index is 26.23 kg/m.   Physical Exam  Nursing note and vitals reviewed. Constitutional: She is  oriented to person, place, and time. She appears well-developed. No distress.  HENT:  Head: Normocephalic and atraumatic.  Mouth/Throat: Oropharynx is clear and moist and mucous membranes are normal.  Eyes: Pupils are equal, round, and reactive to light. Conjunctivae are normal.  Cardiovascular: Normal rate and regular rhythm.  No murmur heard. Pulses:      Dorsalis pedis pulses are 2+ on the right side, and 2+ on the left side.  Respiratory: Effort normal and breath sounds normal. No respiratory distress.  GI: Soft. She exhibits no mass. There is no hepatomegaly. There is no tenderness.  Musculoskeletal: She exhibits no edema.  Right knee brace.  Neurological: She is alert and oriented to person, place, and time. She has normal strength. No cranial nerve deficit. Gait abnormal.  Mildly unstable gait assisted with a walker.  Skin: Skin is warm. No rash noted. No erythema.  Psychiatric: She has a normal mood and affect.  Well groomed, good eye contact.       ASSESSMENT AND PLAN:   [name] was seen today for 6 months follow-up.  No orders of the defined types were placed in this encounter.   Essential hypertension BP mildly elevated, could be related to pain. For now no changes in Coreg 6.25 mg twice daily. Instructed to check BP at few times per week. I will see  her back in 6 months, before if needed.  Hyperlipidemia She would like to check lipid panel next visit. No changes in atorvastatin 20 mg daily. Continue low-fat diet. We will plan on checking lipid panel in 6 months.  Lumbar radiculopathy She has appt with ortho 08/23/18, Dr Tonita Cong. Also following with Dr Naaman Plummer for pain management.  Generalized anxiety disorder with panic attacks Tolerating Lorazepam well. Some side effects discussed. Continue following with prescriber.     [name]. [name], MD  [facility] [place] office.

## 2018-08-16 NOTE — Assessment & Plan Note (Signed)
BP mildly elevated, could be related to pain. For now no changes in Coreg 6.25 mg twice daily. Instructed to check BP at few times per week. I will see her back in 6 months, before if needed.

## 2018-08-16 NOTE — Patient Instructions (Addendum)
A few things to remember from today's visit:   Essential hypertension  Hyperlipidemia, unspecified hyperlipidemia type  Lumbar radiculopathy  Generalized anxiety disorder with panic attacks  Continue monitoring blood pressure at home. For now no changes in Coreg dose. Continue cholesterol medication. I will see you back in 6 months and if everything is fine at that time I think it would be appropriate to follow annually given the fact he also see other providers at few times per year. Keep your appointment with orthopedist.   Please be sure medication list is accurate. If a new problem present, please set up appointment sooner than planned today.

## 2018-08-16 NOTE — Assessment & Plan Note (Signed)
She would like to check lipid panel next visit. No changes in atorvastatin 20 mg daily. Continue low-fat diet. We will plan on checking lipid panel in 6 months.

## 2018-08-17 ENCOUNTER — Encounter: Payer: Self-pay | Admitting: Physical Medicine & Rehabilitation

## 2018-08-17 ENCOUNTER — Other Ambulatory Visit: Payer: Self-pay

## 2018-08-17 ENCOUNTER — Encounter: Payer: 59 | Attending: Physical Medicine & Rehabilitation | Admitting: Physical Medicine & Rehabilitation

## 2018-08-17 VITALS — BP 129/89 | HR 89 | Ht 65.0 in | Wt 158.0 lb

## 2018-08-17 DIAGNOSIS — G8220 Paraplegia, unspecified: Secondary | ICD-10-CM | POA: Diagnosis not present

## 2018-08-17 DIAGNOSIS — M5416 Radiculopathy, lumbar region: Secondary | ICD-10-CM | POA: Insufficient documentation

## 2018-08-17 DIAGNOSIS — Z9889 Other specified postprocedural states: Secondary | ICD-10-CM

## 2018-08-17 DIAGNOSIS — Z5181 Encounter for therapeutic drug level monitoring: Secondary | ICD-10-CM | POA: Diagnosis not present

## 2018-08-17 DIAGNOSIS — I10 Essential (primary) hypertension: Secondary | ICD-10-CM | POA: Insufficient documentation

## 2018-08-17 DIAGNOSIS — E785 Hyperlipidemia, unspecified: Secondary | ICD-10-CM | POA: Insufficient documentation

## 2018-08-17 DIAGNOSIS — K5900 Constipation, unspecified: Secondary | ICD-10-CM | POA: Insufficient documentation

## 2018-08-17 MED ORDER — OXYCODONE-ACETAMINOPHEN 5-325 MG PO TABS: 1.0000 | ORAL_TABLET | Freq: Four times a day (QID) | ORAL | 0 refills | Status: DC | PRN

## 2018-08-17 MED ORDER — TRAMADOL HCL 50 MG PO TABS: 50.0000 mg | ORAL_TABLET | Freq: Two times a day (BID) | ORAL | 2 refills | Status: DC

## 2018-08-17 MED ORDER — GABAPENTIN 600 MG PO TABS: 600.0000 mg | ORAL_TABLET | Freq: Three times a day (TID) | ORAL | 3 refills | Status: DC

## 2018-08-17 MED FILL — OXYCODONE-ACETAMINOPHEN 5-3: 5-325 | 8 days supply | Qty: 60 | Fill #0

## 2018-08-17 MED FILL — GABAPENTIN 600 MG TABLET: 600 | 30 days supply | Qty: 90 | Fill #0

## 2018-08-17 MED FILL — traMADol HCL 50 MG TABS: 50 | 30 days supply | Qty: 60 | Fill #0

## 2018-08-17 NOTE — Patient Instructions (Signed)
CONTINUE MAINTAINING YOUR STRETCHES

## 2018-08-17 NOTE — Progress Notes (Signed)
Subjective:    Patient ID: [name], female    DOB: Dec 11, 1960, 57 y.o.   MRN: [id number]  HPI   [name] is here in follow up of her lumbar radiculopathy and associated pain and gait disorder. She is taking percocets 2-3 x per day, tramadol '50mg'$  twice daily also. She uses tramadol more often during the day.  She also continues on gabapentin 4 mg 3 times daily for nerve pain and Voltaren for her localized left knee discomfort.  Sleep has been limited by pain.  She is doing some walking with her rolling walker.  She takes part in some home stretches as well.  She is still waiting for therapy to come out to the house.  She denies any falls or mishaps.  Bowels and bladder seem to be working well.  She is essentially off any stool softeners or laxatives at this point.   Pain Inventory Average Pain 6 Pain Right Now 6 My pain is constant  In the last 24 hours, has pain interfered with the following? General activity 5 Relation with others 5 Enjoyment of life 5 What TIME of day is your pain at its worst? varies Sleep (in general) Fair  Pain is worse with: sitting, standing and some activites Pain improves with: heat/ice, medication and injections Relief from Meds: 4  Mobility use a walker  Function disabled: date disabled 2019 I need assistance with the following:  household duties  Neuro/Psych weakness trouble walking spasms anxiety  Prior Studies Any changes since last visit?  yes CT/MRI  Physicians involved in your care Any changes since last visit?  no   Family History  Problem Relation Age of Onset  . Heart attack Mother   . Lung cancer Father   . Cancer Father   . Pancreatic cancer Sister   . Breast cancer Sister 63  . Throat cancer Brother   . Multiple myeloma Sister   . Breast cancer Sister        diagnosed in her 5's  . Heart attack Sister   . Stomach cancer Cousin   . Colon cancer Neg Hx    Social History   Socioeconomic History   . Marital status: Married    Spouse name: Not on file  . Number of children: Not on file  . Years of education: Not on file  . Highest education level: Not on file  Occupational History  . Not on file  Social Needs  . Financial resource strain: Not on file  . Food insecurity:    Worry: Not on file    Inability: Not on file  . Transportation needs:    Medical: Not on file    Non-medical: Not on file  Tobacco Use  . Smoking status: Never Smoker  . Smokeless tobacco: Never Used  Substance and Sexual Activity  . Alcohol use: No  . Drug use: No  . Sexual activity: Yes    Partners: Male    Birth control/protection: Post-menopausal  Lifestyle  . Physical activity:    Days per week: Not on file    Minutes per session: Not on file  . Stress: Not on file  Relationships  . Social connections:    Talks on phone: Not on file    Gets together: Not on file    Attends religious service: Not on file    Active member of club or organization: Not on file    Attends meetings of clubs or organizations: Not on file  Relationship status: Not on file  Other Topics Concern  . Not on file  Social History Narrative  . Not on file   Past Surgical History:  Procedure Laterality Date  . ABDOMINAL HYSTERECTOMY    . CARDIAC CATHETERIZATION N/A 04/18/2015   Procedure: Left Heart Cath and Coronary Angiography;  Surgeon: [name], MD;  Location: [place] [facility];  Service: Cardiovascular;  Laterality: N/A;  . FOOT SURGERY Bilateral    [place] "bunion,bone spur, tendon" (1) -6'16, (1)-10'16  . IR EPIDUROGRAPHY  07/21/2018  . LUMBAR LAMINECTOMY/DECOMPRESSION MICRODISCECTOMY Bilateral 12/28/2015   Procedure: MICRO LUMBAR DECOMPRESSION L4 - L5 BILATERALLY;  Surgeon: [name], MD;  [facility];  Service: Orthopedics;  Laterality: Bilateral;  . LUMBAR LAMINECTOMY/DECOMPRESSION MICRODISCECTOMY Bilateral 03/04/2018   Procedure: Revision of Microlumbar Decompression Bilateral Lumbar  Four-Five;  Surgeon: [name], MD;  Location: [place];  Service: Orthopedics;  Laterality: Bilateral;  90 mins  . WISDOM TOOTH EXTRACTION    . WOUND EXPLORATION N/A 03/04/2018   Procedure: EXPLORATION OF LUMBAR DECOMPRESSION WOUND;  Surgeon: [name], MD;  Location: [place];  Service: Orthopedics;  Laterality: N/A;   Past Medical History:  Diagnosis Date  . Anxiety   . Back pain    related to spinal stenosis and disc problem, radiates down left buttocks to leg., weakness occ.  [name] Dyspnea   . Headache   . Hyperlipidemia   . Hypertension   . PONV (postoperative nausea and vomiting)   . Vaginal foreign object    "Uses Femring"   BP 129/89   Pulse 89   Ht '5\' 5"'$  (1.651 m)   Wt 158 lb (71.7 kg)   SpO2 97%   BMI 26.29 kg/m   Opioid Risk Score:   Fall Risk Score:  `1  Depression screen PHQ 2/9  Depression screen [facility] 2/9 08/17/2018 07/05/2018 05/03/2018 04/01/2018  Decreased Interest 0 0 0 1  Down, Depressed, Hopeless 0 0 0 1  PHQ - 2 Score 0 0 0 2  Altered sleeping - - - 1  Tired, decreased energy - - - 1  Change in appetite - - - 1  Feeling bad or failure about yourself  - - - 0  Trouble concentrating - - - 0  Moving slowly or fidgety/restless - - - 0  Suicidal thoughts - - - 0  PHQ-9 Score - - - 5  Difficult doing work/chores - - - Somewhat difficult    Review of Systems  Constitutional: Negative.   HENT: Negative.   Eyes: Negative.   Respiratory: Negative.   Cardiovascular: Negative.   Gastrointestinal: Negative.   Endocrine: Negative.   Genitourinary: Negative.   Musculoskeletal: Negative.   Skin: Negative.   Allergic/Immunologic: Negative.   Neurological: Negative.   Hematological: Negative.   Psychiatric/Behavioral: Negative.   All other systems reviewed and are negative.      Objective:   Physical Exam  General: Alert and oriented x 3, No apparent distress HEENT: Head is normocephalic, atraumatic, PERRLA, EOMI, sclera anicteric, oral mucosa pink and  moist, dentition intact, ext ear canals clear,  Neck: Supple without JVD or lymphadenopathy Heart: Reg rate and rhythm. No murmurs rubs or gallops Chest: CTA bilaterally without wheezes, rales, or rhonchi; no distress Abdomen: Soft, non-tender, non-distended, bowel sounds positive. Extremities: No clubbing, cyanosis, or edema. Pulses are 2+ Skin: Clean and intact without signs of breakdown Neuro: Pt is cognitively appropriate with normal insight, memory, and awareness. Cranial nerves 2-12 are intact.  Reflexes  are 2+ in all 4's. Fine motor coordination is intact. No tremors. Motor function is grossly 5/5 in the upper extremities.  Lower extremities she has has cogwheeling weakness bilaterally distal more than proximal.  She had minimal ankle dorsiflexion and plantarflexion today.  Left side reveals intermittent 2-3 out of 5 ankle dorsiflexion and plantarflexion.  Sensation decreased in distal lower extremities..  Musculoskeletal: Low back range of motion limited in flexion and extension.  She is tender to palpation over the lower lumbar segments left more than right today.  Some spasm is appreciated.  She is antalgic with weightbearing on the left side but still moves left side more easily than the right. Psych: Pt's affect is appropriate. Pt is cooperative         Assessment & Plan:  Medical Problem List and Plan: 1.Decreased functional mobilitysecondary to acute onchronic lumbar radiculopathy and foraminal stenosis bilaterally with decompression May 2019. Recent fall with exacerbation of pain and motor or sensory deficits.  --received ESI 9/25, may need a repeat injection---sees ortho on 10/28              -waiting to start with [facility] PT, OT 2.   Pain Management:Neurontin: increase to '600mg'$  TID            -maintain Robaxin 500 mg 3 times daily  -continue Ultram 50 mg every 12 hours, oxycodone as needed -percocet 5/325 every 6 hours as needed for more severe, evening  pain.  #60  -voltaren gel for left knee  -Maintain topical modalities as well. 4. Mood:             -continued ego support provided 5.  .Constipation. Moving with PRNLaxative assistance.             -is moving bowels regularly                Thirty minutes of face to face patient care time were spent during this visit. All questions were encouraged and answered.

## 2018-08-19 DIAGNOSIS — R269 Unspecified abnormalities of gait and mobility: Secondary | ICD-10-CM | POA: Diagnosis not present

## 2018-08-19 DIAGNOSIS — M48061 Spinal stenosis, lumbar region without neurogenic claudication: Secondary | ICD-10-CM | POA: Diagnosis not present

## 2018-08-23 DIAGNOSIS — M545 Low back pain: Secondary | ICD-10-CM | POA: Diagnosis not present

## 2018-08-23 DIAGNOSIS — M5416 Radiculopathy, lumbar region: Secondary | ICD-10-CM | POA: Diagnosis not present

## 2018-08-23 DIAGNOSIS — M5126 Other intervertebral disc displacement, lumbar region: Secondary | ICD-10-CM | POA: Diagnosis not present

## 2018-08-24 ENCOUNTER — Encounter: Payer: Self-pay | Admitting: Physical Therapy

## 2018-08-24 ENCOUNTER — Ambulatory Visit: Payer: 59 | Attending: Specialist | Admitting: Physical Therapy

## 2018-08-24 ENCOUNTER — Other Ambulatory Visit: Payer: Self-pay

## 2018-08-24 DIAGNOSIS — M5416 Radiculopathy, lumbar region: Secondary | ICD-10-CM | POA: Diagnosis not present

## 2018-08-24 DIAGNOSIS — M5442 Lumbago with sciatica, left side: Secondary | ICD-10-CM | POA: Insufficient documentation

## 2018-08-24 DIAGNOSIS — G8929 Other chronic pain: Secondary | ICD-10-CM | POA: Diagnosis not present

## 2018-08-24 DIAGNOSIS — M25561 Pain in right knee: Secondary | ICD-10-CM

## 2018-08-24 DIAGNOSIS — M6281 Muscle weakness (generalized): Secondary | ICD-10-CM | POA: Diagnosis not present

## 2018-08-24 DIAGNOSIS — R262 Difficulty in walking, not elsewhere classified: Secondary | ICD-10-CM

## 2018-08-24 NOTE — Therapy (Addendum)
[place], [name], [address] Phone: [phone]   Fax:  [phone]  Physical Therapy Evaluation  Patient Details  Name: [name] MRN: [id number] Date of Birth: Apr 01, 1961 Referring Provider (PT): [name], MD   Encounter Date: 08/24/2018  PT End of Session - 08/24/18 1508    Visit Number  1    Number of Visits  12    Date for PT Re-Evaluation  10/05/18    Authorization Type  [employer]    PT Start Time  1418    PT Stop Time  1500    PT Time Calculation (min)  42 min    Activity Tolerance  Patient tolerated treatment well    Behavior During Therapy  [facility] for tasks assessed/performed       Past Medical History:  Diagnosis Date  . Anxiety   . Back pain    related to spinal stenosis and disc problem, radiates down left buttocks to leg., weakness occ.  [name] Dyspnea   . Headache   . Hyperlipidemia   . Hypertension   . PONV (postoperative nausea and vomiting)   . Vaginal foreign object    "Uses Femring"    Past Surgical History:  Procedure Laterality Date  . ABDOMINAL HYSTERECTOMY    . CARDIAC CATHETERIZATION N/A 04/18/2015   Procedure: Left Heart Cath and Coronary Angiography;  Surgeon: [name], MD;  Location: [place] [facility];  Service: Cardiovascular;  Laterality: N/A;  . FOOT SURGERY Bilateral    [name] "bunion,bone spur, tendon" (1) -6'16, (1)-10'16  . IR EPIDUROGRAPHY  07/21/2018  . LUMBAR LAMINECTOMY/DECOMPRESSION MICRODISCECTOMY Bilateral 12/28/2015   Procedure: MICRO LUMBAR DECOMPRESSION L4 - L5 BILATERALLY;  Surgeon: [name], MD;  [facility];  Service: Orthopedics;  Laterality: Bilateral;  . LUMBAR LAMINECTOMY/DECOMPRESSION MICRODISCECTOMY Bilateral 03/04/2018   Procedure: Revision of Microlumbar Decompression Bilateral Lumbar Four-Five;  Surgeon: [name], MD;  Location: [place];  Service: Orthopedics;  Laterality: Bilateral;  90 mins  .  WISDOM TOOTH EXTRACTION    . WOUND EXPLORATION N/A 03/04/2018   Procedure: EXPLORATION OF LUMBAR DECOMPRESSION WOUND;  Surgeon: [name], MD;  Location: [place];  Service: Orthopedics;  Laterality: N/A;    There were no vitals filed for this visit.   Subjective Assessment - 08/24/18 1421    Subjective  Went to sit in rolling chair at work and it rolled out from under her and she fell on floor. Was admitted to hospital and then to inpatient rehab [facility] a few days later. Had some pain relief with inpatient rehab and doesn't have as much radicular pain down into LLE as it is now just in L buttock region. Awaiting call from MD to scheduele epidural shot in low back as she had one while admitted in hospital. Pt also having numbnes/tingling and locking sensation in posterior left knee. Pt was utilizing cane at work before fall for low back pain and foot drop because RW would not fit into lab room.     Pertinent History  previous back decompression and laminectomy on 12/28/15 at L4-L5  with redo decompression surgery on May 9,2019, Bil foot surgery    Limitations  Standing;Walking;Sitting;Other (comment)   especially twisting   How long can you sit comfortably?  can't sit for long     How long can you stand comfortably?  can't stand for long     How long can you walk comfortably?  short community  distances     Patient Stated Goals  decrease pain and radicuopathy in low back to return to walking     Currently in Pain?  Yes    Pain Score  6    hasn' t take any pain medications today   Pain Location  Back    Pain Orientation  Lower;Left    Pain Descriptors / Indicators  Shooting;Sharp    Pain Type  Chronic pain    Pain Radiating Towards  radiates into buttock region     Pain Onset  In the past 7 days    Pain Frequency  Constant    Aggravating Factors   prolonged positoining     Pain Relieving Factors  heat only relief at this point     Effect of Pain on Daily Activities  decreased mobility      Multiple Pain Sites  Yes    Pain Score  3    Pain Location  Knee    Pain Orientation  Left    Pain Descriptors / Indicators  --   locking   Pain Type  Acute pain    Pain Onset  More than a month ago    Pain Frequency  Intermittent    Aggravating Factors   prolonged walking causes it to lock up     Pain Relieving Factors  sitting     Effect of Pain on Daily Activities  decreased mobility          [facility] PT Assessment - 08/24/18 0001      Assessment   Medical Diagnosis  Lumbar radiculopathy     Referring Provider (PT)  [name], MD    Onset Date/Surgical Date  07/12/18    Hand Dominance  Right    Next MD Visit  --   Awaiting call to scheduele epidural    Prior Therapy  Previous PT on low back in September prior to fall; CIR in September after fall       Precautions   Precaution Comments  Per pt MD recommended no lifting/bending/twisting    Required Braces or Orthoses  Other Brace/Splint    Other Brace/Splint  AFO to RLE      Restrictions   Weight Bearing Restrictions  No      Balance Screen   Has the patient fallen in the past 6 months  Yes    How many times?  once    Has the patient had a decrease in activity level because of a fear of falling?   Yes    Is the patient reluctant to leave their home because of a fear of falling?   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    Leisure  walking 30 mins/day 4 days/ week      Cognition   Overall Cognitive Status  Within Functional Limits for tasks assessed    Attention  Focused    Focused Attention  Appears intact    Memory  Appears intact    Awareness  Appears intact    Problem Solving  Appears intact      Sensation   Light Touch  Appears Intact   in Low back ; decreased light touch reported in R foot   Hot/Cold  Appears Intact    Additional Comments  Radiates into L buttock  Posture/Postural Control   Posture/Postural Control  Postural limitations    Postural Limitations  Rounded Shoulders;Forward head;Weight shift right   weight shifted onto R in sitting     ROM / Strength   AROM / PROM / Strength  AROM;PROM;Strength      AROM   Overall AROM Comments  unable to perform on eval due to pain with PROM    AROM Assessment Site  Lumbar;Hip;Knee    Right/Left Hip  Right;Left    Right Hip Flexion  --    Left Hip Flexion  --    Left Hip External Rotation   --    Left Hip Internal Rotation   --    Left Hip ABduction  --    Left Hip ADduction  --    Right/Left Knee  Right;Left    Left Knee Flexion  --      PROM   Overall PROM Comments  Left hip ABD/ADD/IR/ER limted by 75% with worst pain noted in adduction     PROM Assessment Site  Hip    Right/Left Hip  Right;Left    Right Hip External Rotation   --   R hip flexion 95   Left Hip Flexion  86      Strength   Right Hip Flexion  3+/5    Right Hip ABduction  3/5    Right Hip ADduction  3+/5    Left Hip Flexion  3/5    Right Knee Flexion  3+/5    Right Knee Extension  3/5    Left Knee Flexion  3/5    Left Knee Extension  3/5    Right/Left Ankle  --   AFO on right    Left Ankle Dorsiflexion  3/5      Ambulation/Gait   Assistive device  Rolling walker    Gait Comments  Pt ambulates with bil foot drop with R >L and AFO on the right LE. Hyperextends L knee in midstance, dec knee flexion and hip flex in midswing, Decreased toe off on bil LE.. Mild hip drop noted on RLE.               Objective measurements completed on examination: See above findings.              PT Education - 08/24/18 1507    Education Details  Pt educated on HEP and symptom management; cont. with exercises from CIR if do not increase pain. Eval vs. treatment session      Person(s) Educated  Patient    Methods  Explanation;Demonstration;Verbal cues;Handout    Comprehension  Verbalized understanding;Returned  demonstration       PT Short Term Goals - 08/24/18 1737      PT SHORT TERM GOAL #1   Title  She will increase L hip flexion by 10 deg    Time  3    Period  Weeks    Status  New    Target Date  09/14/18      PT SHORT TERM GOAL #2   Title  Pt will able to sit >30 minutes without increase in LBP    Time  3    Period  Weeks    Status  New    Target Date  09/14/18      PT SHORT TERM GOAL #3   Title  Increase L hip abduction strength to 4-/5    Time  3    Period  Weeks    Status  New    Target Date  09/14/18        PT Long Term Goals - 08/24/18 1740      PT LONG TERM GOAL #1   Title  Pt will improve bil LE hip & knee strength to 4/5 in order to allow for normalization of gait.     Time  6    Period  Weeks    Status  New    Target Date  10/05/18      PT LONG TERM GOAL #2   Title  Pt will be able to stand >30 minutes without increase in pain in order to complete ADLs    Time  6    Period  Weeks    Status  New    Target Date  10/05/18      PT LONG TERM GOAL #3   Title  Pt will improve Bil LE hip flexion by 20 deg in order to don and doff socks and shoes     Time  6    Period  Weeks    Status  New    Target Date  10/05/18             Plan - 08/24/18 1511    Clinical Impression Statement  Pt is a 57 y.o. female who is presenting with lumbar radiculopathy s/p fall at work on Sept. 16, 2019. Patient did [facility] after fall at [facility] with some low back pain relief. Pt ambulating with multiple gait deficits including bil foot drop with AFO on RLE and decreased knee and hip flexion during swing phase. Pt with significant weakness in BIL LE and decreased bil hip and knee PROM with L>R. Upon passive IR on right hip pt reported significant increase in pain in L low back, in which she requested to sit with heat onto back. Pt reported pain in back was decreased after 5+ minutes of heat in low back. Encouraged pt to continue with HEP from CIR and exercises given at eval and  discontinue performing if increase back pain.     History and Personal Factors relevant to plan of care:  Redo laminectomy/ disectomy L4-L5 surgery in May 2019,anxiety with depression     Clinical Presentation  Evolving    Clinical Presentation due to:  pain in low back and bil LE weakness progressively effecting mobility     Clinical Decision Making  Moderate    Rehab Potential  Fair    PT Frequency  2x / week    PT Duration  6 weeks    PT Treatment/Interventions  Manual techniques;Patient/family education;Therapeutic exercise;Therapeutic activities;Cryotherapy;Gait training;Stair training;ADLs/Self Care Home Management;Electrical Stimulation;Iontophoresis 4mg /ml Dexamethasone;Moist Heat;Ultrasound;Neuromuscular re-education;Functional mobility training;Passive range of motion;Taping;Joint Manipulations;Traction;Balance training    PT Next Visit Plan  STM, myofascial release to lumbar paraspinals if able to tolerate positioning, seated ball squeezes, ab sets, light core strengthening, hamstring stretch, Patient had difficulty in supine.     PT Home Exercise Plan  seated ab sets and ball squeezes    Consulted and Agree with Plan of Care  Patient       Patient will benefit from skilled therapeutic intervention in order to improve the following deficits and impairments:  Pain, Postural dysfunction, Difficulty walking, Decreased strength, Decreased activity tolerance, Abnormal gait, Increased muscle spasms, Decreased endurance, Decreased range of motion, Decreased balance  Visit Diagnosis: Chronic left-sided low back pain with left-sided sciatica  Radiculopathy, lumbar region  Muscle weakness (generalized)  Difficulty in walking, not elsewhere classified  Acute  pain of right knee     Problem List Patient Active Problem List   Diagnosis Date Noted  . Lumbar spinal stenosis   . Radicular pain   . Gait disorder   . Difficulty in urination 07/13/2018  . Back pain 07/12/2018  .  Hyperlipidemia 04/16/2018  . Generalized anxiety disorder with panic attacks 04/16/2018  . AKI (acute kidney injury) ([place])   . Benign essential HTN   . Depression with anxiety   . Lumbar radiculopathy 03/09/2018  . Paraparesis ([place]) 03/09/2018  . Essential hypertension   . Chronic nonintractable headache   . Leukocytosis   . Acute blood loss anemia   . Postoperative pain   . Generalized weakness   . Spinal stenosis at L4-L5 level 03/04/2018  . Spinal stenosis of lumbar region 12/28/2015  . Chest pain 04/16/2015    Carney Living PT DPT  08/25/2018, 8:08 AM   Einar Crow SPT  08/25/2018   During this treatment session, the therapist was present, participating in and directing the treatment.   [facility] [place], [name], [address] Phone: [phone]   Fax:  [phone]  Name: SELINA TAPPER MRN: [id number] Date of Birth: 31-Aug-1961

## 2018-08-24 NOTE — Patient Instructions (Signed)
Seated Diaphragmatic Breathing reps: 10 sets: 2 daily: 2 weekly: 7   Exercise image step 1   Exercise image step 2 When breath out bring navel towards spine for 3 sec holds  Setup  Begin sitting in an upright position with one hand on your upper belly and your other hand on your chest.  Movement  Take a deep breath in, feeling your stomach expand against your hand, then breathe out. Repeat.  Tip  You should not feel any movement in your chest as you breathe. Seated Hip Adduction Squeeze with Ball reps: 10 sets: 2 daily: 2 weekly: 7   Exercise image step 1   Exercise image step 2 Setup  Begin sitting upright with both feet flat on the floor and a ball between your knees. Movement  Gently squeeze both legs inward against the ball. Hold, then relax and repeat. Tip  Make sure not to arch your back during this exercise.

## 2018-08-25 ENCOUNTER — Encounter: Payer: Self-pay | Admitting: Physical Therapy

## 2018-08-30 ENCOUNTER — Ambulatory Visit: Payer: Self-pay

## 2018-08-30 MED FILL — HYDROCODON-APAP 5-325: 5-325 | 2 days supply | Qty: 10 | Fill #0

## 2018-08-30 NOTE — Telephone Encounter (Signed)
  Reason for Disposition . General information question, no triage required and triager able to answer question  Answer Assessment - Initial Assessment Questions 1. REASON FOR CALL or QUESTION: "What is your reason for calling today?" or "How can I best help you?" or "What question do you have that I can help answer?"      Patient is getting ready to have some widsom taken out and wants to know if ALPRAZolam Duanne Moron) tablet 0.25 mg will help her or should she take LORazepam (ATIVAN) 0.5 MG tablet? Patient states the dentist told her just to take one of her anxiety medications. Please call patient, her appt is at 2pm.   Called pt back and Lorazepam is on her medication list. Advised pt to take the Lorazepam.  Protocols used: INFORMATION ONLY CALL-A-AH

## 2018-09-02 ENCOUNTER — Ambulatory Visit: Payer: 59 | Admitting: Physical Therapy

## 2018-09-06 ENCOUNTER — Encounter: Payer: Self-pay | Admitting: Physical Therapy

## 2018-09-06 ENCOUNTER — Ambulatory Visit: Payer: 59 | Attending: Specialist | Admitting: Physical Therapy

## 2018-09-06 DIAGNOSIS — G8929 Other chronic pain: Secondary | ICD-10-CM | POA: Diagnosis not present

## 2018-09-06 DIAGNOSIS — R262 Difficulty in walking, not elsewhere classified: Secondary | ICD-10-CM | POA: Insufficient documentation

## 2018-09-06 DIAGNOSIS — M5416 Radiculopathy, lumbar region: Secondary | ICD-10-CM | POA: Insufficient documentation

## 2018-09-06 DIAGNOSIS — M5442 Lumbago with sciatica, left side: Secondary | ICD-10-CM | POA: Diagnosis not present

## 2018-09-06 DIAGNOSIS — M25561 Pain in right knee: Secondary | ICD-10-CM | POA: Insufficient documentation

## 2018-09-06 DIAGNOSIS — M6281 Muscle weakness (generalized): Secondary | ICD-10-CM | POA: Insufficient documentation

## 2018-09-06 NOTE — Therapy (Addendum)
[place], [name], [address] Phone: [phone]   Fax:  [phone]  Physical Therapy Treatment  Patient Details  Name: [name] MRN: [id number] Date of Birth: 05-Apr-1961 Referring Provider (PT): [name], MD   Encounter Date: 09/06/2018  PT End of Session - 09/06/18 1318    Visit Number  2    Number of Visits  12    Date for PT Re-Evaluation  10/05/18    Authorization Type  [employer]    PT Start Time  2703    PT Stop Time  1240    PT Time Calculation (min)  55 min    Activity Tolerance  Patient tolerated treatment well    Behavior During Therapy  [facility] for tasks assessed/performed       Past Medical History:  Diagnosis Date  . Anxiety   . Back pain    related to spinal stenosis and disc problem, radiates down left buttocks to leg., weakness occ.  [name] Dyspnea   . Headache   . Hyperlipidemia   . Hypertension   . PONV (postoperative nausea and vomiting)   . Vaginal foreign object    "Uses Femring"    Past Surgical History:  Procedure Laterality Date  . ABDOMINAL HYSTERECTOMY    . CARDIAC CATHETERIZATION N/A 04/18/2015   Procedure: Left Heart Cath and Coronary Angiography;  Surgeon: [name], MD;  Location: [place] [facility];  Service: Cardiovascular;  Laterality: N/A;  . FOOT SURGERY Bilateral    [place] "bunion,bone spur, tendon" (1) -6'16, (1)-10'16  . IR EPIDUROGRAPHY  07/21/2018  . LUMBAR LAMINECTOMY/DECOMPRESSION MICRODISCECTOMY Bilateral 12/28/2015   Procedure: MICRO LUMBAR DECOMPRESSION L4 - L5 BILATERALLY;  Surgeon: [name], MD;  Location: [facility];  Service: Orthopedics;  Laterality: Bilateral;  . LUMBAR LAMINECTOMY/DECOMPRESSION MICRODISCECTOMY Bilateral 03/04/2018   Procedure: Revision of Microlumbar Decompression Bilateral Lumbar Four-Five;  Surgeon: [name], MD;  Location: [facility];  Service: Orthopedics;  Laterality: Bilateral;  90 mins  .  WISDOM TOOTH EXTRACTION    . WOUND EXPLORATION N/A 03/04/2018   Procedure: EXPLORATION OF LUMBAR DECOMPRESSION WOUND;  Surgeon: [name], MD;  Location: [facility];  Service: Orthopedics;  Laterality: N/A;    There were no vitals filed for this visit.  Subjective Assessment - 09/06/18 1157    Subjective  Had wisdom teeth roomved last monday and as a result has had a lot of mouth pain. Back pain also increased as a result of how she was laying for the proceedure. Has been doing exercises at home and noticed a spasm in upper back when breathing out with ab sets.     Pertinent History  previous back decompression and laminectomy on 12/28/15 at L4-L5  with redo decompression surgery on May 9,2019, Bil foot surgery    Limitations  Standing;Walking;Sitting;Other (comment)    How long can you sit comfortably?  can't sit for long     How long can you stand comfortably?  can't stand for long     How long can you walk comfortably?  short community distances     Patient Stated Goals  decrease pain and radicuopathy in low back to return to walking     Currently in Pain?  Yes    Pain Score  7     Pain Location  Back    Pain Orientation  Lower;Left    Pain Descriptors / Indicators  Shooting;Aching    Pain Type  Chronic  pain    Pain Radiating Towards  radiates into buttock region    Pain Onset  In the past 7 days    Pain Frequency  Constant    Aggravating Factors   prolonged positioning    Pain Relieving Factors  heat only relief at this point     Effect of Pain on Daily Activities  decreased mobility     Multiple Pain Sites  Yes    Pain Score  7   pain in mouth secondary to wisdom teeth removal   Pain Location  Mouth                       [facility] Adult PT Treatment/Exercise - 09/06/18 0001      Lumbar Exercises: Supine   Ab Set  10 reps   cues for breathing   Clam  20 reps    Clam Limitations  yellow t-band    Other Supine Lumbar Exercises  Leg lengthener 2x5sec holds Bil; leg  presses 2x5 sec holds BIl    Other Supine Lumbar Exercises  Ball squeezes 2x10 with cues for core engagement       Modalities   Modalities  Moist Heat      Moist Heat Therapy   Number Minutes Moist Heat  10 Minutes    Moist Heat Location  Lumbar Spine      Electrical Stimulation   Electrical Stimulation Location  lower left back    Electrical Stimulation Action  IFC    Electrical Stimulation Parameters  L5    Electrical Stimulation Goals  Pain             PT Education - 09/06/18 1200    Education Details  Educated on [place] HEP; core anatomy; e-stim for pain manangement     Person(s) Educated  Patient    Methods  Explanation;Demonstration;Tactile cues;Verbal cues;Handout    Comprehension  Verbalized understanding;Returned demonstration;Verbal cues required       PT Short Term Goals - 09/06/18 1323      PT SHORT TERM GOAL #1   Title  She will increase L hip flexion by 10 deg    Time  3    Period  Weeks    Status  On-going      PT SHORT TERM GOAL #2   Title  Pt will able to sit >30 minutes without increase in LBP    Time  3    Period  Weeks    Status  On-going      PT SHORT TERM GOAL #3   Title  Increase L hip abduction strength to 4-/5    Time  3    Period  Weeks    Status  On-going      PT SHORT TERM GOAL #4   Title  She will demo correctl flexion of RT leg with walking with SPC without LOB    Time  4    Period  Weeks    Status  On-going        PT Long Term Goals - 09/06/18 1324      PT LONG TERM GOAL #1   Title  Pt will improve bil LE hip & knee strength to 4/5 in order to allow for normalization of gait.     Time  6    Period  Weeks    Status  On-going      PT LONG TERM GOAL #2   Title  Pt will be able  to stand >30 minutes without increase in pain in order to complete ADLs    Time  6    Period  Weeks    Status  On-going      PT LONG TERM GOAL #3   Title  Pt will improve Bil LE hip flexion by 20 deg in order to don and doff socks and shoes      Time  6    Period  Weeks    Status  On-going            Plan - 09/06/18 1318    Clinical Impression Statement  Patient tolerated treatment well. Believes low back pain was increased last week secondary to positioning she was placed in for wisdom teeth removal. Able to tolerate being in supine for >30 minutes with wedge and 3 pillows underneith head and back. Therapy focused on light core stabilization and decompressive series without any increased pain. E-stim placed on patient at end of session in conjunction with heat to left low back.     History and Personal Factors relevant to plan of care:  redo laminectomy/disectomy L4-5 surgery in May 2019, anxiety with depression     Clinical Presentation  Evolving    Clinical Presentation due to:  painin low back and bil LE weakness progressively effcting mobility     Clinical Decision Making  Moderate    Rehab Potential  Fair    PT Frequency  2x / week    PT Duration  6 weeks    PT Treatment/Interventions  Manual techniques;Patient/family education;Therapeutic exercise;Therapeutic activities;Cryotherapy;Gait training;Stair training;ADLs/Self Care Home Management;Electrical Stimulation;Iontophoresis 4mg /ml Dexamethasone;Moist Heat;Ultrasound;Neuromuscular re-education;Functional mobility training;Passive range of motion;Taping;Joint Manipulations;Traction;Balance training    PT Next Visit Plan  Utilize wedge and 3+ pillows to improve comfort. Instructions on how to purchase tens unit if interested. Assess response to decompressive series from last visit. STM, myofascial release to lumbar paraspinals if able to tolerate positioning, ball squeezes,isometric hip flexion, light core strengthening, hamstring stretch, trunk rotations     PT Home Exercise Plan  seated ab sets and ball squeezes; leg lengthener and leg presses     Consulted and Agree with Plan of Care  Patient       Patient will benefit from skilled therapeutic intervention in order to  improve the following deficits and impairments:  Pain, Postural dysfunction, Difficulty walking, Decreased strength, Decreased activity tolerance, Abnormal gait, Increased muscle spasms, Decreased endurance, Decreased range of motion, Decreased balance  Visit Diagnosis: Chronic left-sided low back pain with left-sided sciatica  Radiculopathy, lumbar region  Muscle weakness (generalized)  Difficulty in walking, not elsewhere classified     Problem List Patient Active Problem List   Diagnosis Date Noted  . Lumbar spinal stenosis   . Radicular pain   . Gait disorder   . Difficulty in urination 07/13/2018  . Back pain 07/12/2018  . Hyperlipidemia 04/16/2018  . Generalized anxiety disorder with panic attacks 04/16/2018  . AKI (acute kidney injury) ([place])   . Benign essential HTN   . Depression with anxiety   . Lumbar radiculopathy 03/09/2018  . Paraparesis ([place]) 03/09/2018  . Essential hypertension   . Chronic nonintractable headache   . Leukocytosis   . Acute blood loss anemia   . Postoperative pain   . Generalized weakness   . Spinal stenosis at L4-L5 level 03/04/2018  . Spinal stenosis of lumbar region 12/28/2015  . Chest pain 04/16/2015   Carolyne Littles PT DPT  09/06/2018  Einar Crow SPT 09/06/2018, 1:29  PM  During this treatment session, the therapist was present, participating in and directing the treatment.   [place], [name], [address] Phone: [phone]   Fax:  [phone]  Name: [name] MRN: [id number] Date of Birth: [birth year]

## 2018-09-08 ENCOUNTER — Other Ambulatory Visit: Payer: Self-pay | Admitting: Orthopedic Surgery

## 2018-09-08 DIAGNOSIS — M5416 Radiculopathy, lumbar region: Secondary | ICD-10-CM

## 2018-09-09 ENCOUNTER — Ambulatory Visit: Payer: 59 | Admitting: Physical Therapy

## 2018-09-09 ENCOUNTER — Encounter: Payer: Self-pay | Admitting: Physical Therapy

## 2018-09-09 DIAGNOSIS — M6281 Muscle weakness (generalized): Secondary | ICD-10-CM

## 2018-09-09 DIAGNOSIS — G8929 Other chronic pain: Secondary | ICD-10-CM

## 2018-09-09 DIAGNOSIS — M25561 Pain in right knee: Secondary | ICD-10-CM

## 2018-09-09 DIAGNOSIS — M5416 Radiculopathy, lumbar region: Secondary | ICD-10-CM

## 2018-09-09 DIAGNOSIS — R262 Difficulty in walking, not elsewhere classified: Secondary | ICD-10-CM

## 2018-09-09 DIAGNOSIS — M5442 Lumbago with sciatica, left side: Secondary | ICD-10-CM | POA: Diagnosis not present

## 2018-09-09 NOTE — Therapy (Addendum)
[address] [name], [name], [address] Phone: [phone]   Fax:  [phone]  Physical Therapy Treatment  Patient Details  Name: [name] MRN: [id number] Date of Birth: 03-04-1961 Referring Provider (PT): [name], MD   Encounter Date: 09/09/2018  PT End of Session - 09/09/18 1110    Visit Number  3    Number of Visits  12    Date for PT Re-Evaluation  10/05/18    Authorization Type  [employer]    PT Start Time  1103    PT Stop Time  1148    PT Time Calculation (min)  45 min    Activity Tolerance  Patient tolerated treatment well    Behavior During Therapy  [facility] for tasks assessed/performed       Past Medical History:  Diagnosis Date  . Anxiety   . Back pain    related to spinal stenosis and disc problem, radiates down left buttocks to leg., weakness occ.  [name] Dyspnea   . Headache   . Hyperlipidemia   . Hypertension   . PONV (postoperative nausea and vomiting)   . Vaginal foreign object    "Uses Femring"    Past Surgical History:  Procedure Laterality Date  . ABDOMINAL HYSTERECTOMY    . CARDIAC CATHETERIZATION N/A 04/18/2015   Procedure: Left Heart Cath and Coronary Angiography;  Surgeon: [name], MD;  Location: [place];  Service: Cardiovascular;  Laterality: N/A;  . FOOT SURGERY Bilateral    [place] "bunion,bone spur, tendon" (1) -6'16, (1)-10'16  . IR EPIDUROGRAPHY  07/21/2018  . LUMBAR LAMINECTOMY/DECOMPRESSION MICRODISCECTOMY Bilateral 12/28/2015   Procedure: MICRO LUMBAR DECOMPRESSION L4 - L5 BILATERALLY;  Surgeon: [name], MD;  [facility];  Service: Orthopedics;  Laterality: Bilateral;  . LUMBAR LAMINECTOMY/DECOMPRESSION MICRODISCECTOMY Bilateral 03/04/2018   Procedure: Revision of Microlumbar Decompression Bilateral Lumbar Four-Five;  Surgeon: [name], MD;  Location: [facility];  Service: Orthopedics;  Laterality: Bilateral;  90 mins  .  WISDOM TOOTH EXTRACTION    . WOUND EXPLORATION N/A 03/04/2018   Procedure: EXPLORATION OF LUMBAR DECOMPRESSION WOUND;  Surgeon: [name], MD;  Location: [facility];  Service: Orthopedics;  Laterality: N/A;    There were no vitals filed for this visit.  Subjective Assessment - 09/09/18 1105    Subjective  Pt reporting 6-7/10. Pt feels it's the weather making it worse. Pt amb into clinic with RW with antalgic gait. Pt reporting she is going tomorrow for second injection.     Pertinent History  previous back decompression and laminectomy on 12/28/15 at L4-L5  with redo decompression surgery on May 9,2019, Bil foot surgery    Limitations  Standing;Walking;Sitting;Other (comment)    How long can you sit comfortably?  can't sit for long     How long can you stand comfortably?  can't stand for long     How long can you walk comfortably?  short community distances     Patient Stated Goals  decrease pain and radicuopathy in low back to return to walking     Currently in Pain?  Yes    Pain Score  7     Pain Location  Back    Pain Orientation  Lower;Left    Pain Descriptors / Indicators  Aching    Pain Type  Chronic pain    Pain Radiating Towards  radiates down bilateral buttock and into legs    Pain Onset  1  to 4 weeks ago    Pain Frequency  Constant                               PT Education - 09/09/18 1110    Education Details  HEP    Person(s) Educated  Patient    Methods  Explanation;Demonstration    Comprehension  Verbalized understanding;Returned demonstration       PT Short Term Goals - 09/09/18 1111      PT SHORT TERM GOAL #1   Title  She will increase L hip flexion by 10 deg    Time  3    Period  Weeks    Status  On-going      PT SHORT TERM GOAL #2   Title  Pt will able to sit >30 minutes without increase in LBP    Time  3    Period  Weeks    Status  On-going      PT SHORT TERM GOAL #3   Title  Increase L hip abduction strength to 4-/5     Baseline  incr of 4 points to 28/56    Time  3    Period  Weeks    Status  On-going      PT SHORT TERM GOAL #4   Title  She will demo correctl flexion of RT leg with walking with SPC without LOB    Baseline  improved post session with walker    Time  4    Period  Weeks        PT Long Term Goals - 09/06/18 1324      PT LONG TERM GOAL #1   Title  Pt will improve bil LE hip & knee strength to 4/5 in order to allow for normalization of gait.     Time  6    Period  Weeks    Status  On-going      PT LONG TERM GOAL #2   Title  Pt will be able to stand >30 minutes without increase in pain in order to complete ADLs    Time  6    Period  Weeks    Status  On-going      PT LONG TERM GOAL #3   Title  Pt will improve Bil LE hip flexion by 20 deg in order to don and doff socks and shoes     Time  6    Period  Weeks    Status  On-going            Plan - 09/09/18 1130    Clinical Impression Statement  Pt with limited tolerance to treatment today due to "back spasms". Pt was able to work on core strengthening and supine hip adduction and abduction. Pt felt like continuing exercises would trigger further muscle spasms. Pt reported she did not take her muscle relazor this morning. Conitnue with skilled and progress toward goals set.     Rehab Potential  Fair    PT Frequency  2x / week    PT Duration  6 weeks    PT Treatment/Interventions  Manual techniques;Patient/family education;Therapeutic exercise;Therapeutic activities;Cryotherapy;Gait training;Stair training;ADLs/Self Care Home Management;Electrical Stimulation;Iontophoresis 4mg /ml Dexamethasone;Moist Heat;Ultrasound;Neuromuscular re-education;Functional mobility training;Passive range of motion;Taping;Joint Manipulations;Traction;Balance training    PT Next Visit Plan  Utilize wedge and 3+ pillows to improve comfort. Instructions on how to purchase tens unit if interested. Assess response to decompressive series from last  visit.  STM, myofascial release to lumbar paraspinals if able to tolerate positioning, ball squeezes,isometric hip flexion, light core strengthening, hamstring stretch, trunk rotations     PT Home Exercise Plan  seated ab sets and ball squeezes; leg lengthener and leg presses     Consulted and Agree with Plan of Care  Patient       Patient will benefit from skilled therapeutic intervention in order to improve the following deficits and impairments:  Pain, Postural dysfunction, Difficulty walking, Decreased strength, Decreased activity tolerance, Abnormal gait, Increased muscle spasms, Decreased endurance, Decreased range of motion, Decreased balance  Visit Diagnosis: Chronic left-sided low back pain with left-sided sciatica  Radiculopathy, lumbar region  Muscle weakness (generalized)  Difficulty in walking, not elsewhere classified  Acute pain of right knee     Problem List Patient Active Problem List   Diagnosis Date Noted  . Lumbar spinal stenosis   . Radicular pain   . Gait disorder   . Difficulty in urination 07/13/2018  . Back pain 07/12/2018  . Hyperlipidemia 04/16/2018  . Generalized anxiety disorder with panic attacks 04/16/2018  . AKI (acute kidney injury) ([place])   . Benign essential HTN   . Depression with anxiety   . Lumbar radiculopathy 03/09/2018  . Paraparesis ([place]) 03/09/2018  . Essential hypertension   . Chronic nonintractable headache   . Leukocytosis   . Acute blood loss anemia   . Postoperative pain   . Generalized weakness   . Spinal stenosis at L4-L5 level 03/04/2018  . Spinal stenosis of lumbar region 12/28/2015  . Chest pain 04/16/2015    Oretha Caprice, PT 09/10/2018, 9:58 AM  [facility] [address] [place], [name], [address] Phone: [phone]   Fax:  [phone]  Name: PAULINA MUCHMORE MRN: [id number] Date of Birth: 09-06-1961

## 2018-09-10 ENCOUNTER — Ambulatory Visit
Admission: RE | Admit: 2018-09-10 | Discharge: 2018-09-10 | Disposition: A | Discharge status: Home / Self Care | Payer: 59 | Source: Ambulatory Visit | Attending: Orthopedic Surgery | Admitting: Orthopedic Surgery

## 2018-09-10 DIAGNOSIS — M5416 Radiculopathy, lumbar region: Secondary | ICD-10-CM

## 2018-09-10 DIAGNOSIS — M545 Low back pain: Secondary | ICD-10-CM | POA: Diagnosis not present

## 2018-09-10 MED ORDER — METHYLPREDNISOLONE ACETATE 40 MG/ML INJ SUSP (RADIOLOG
120.0000 mg | Freq: Once | INTRAMUSCULAR | Status: AC
  Administered 2018-09-10: 120 mg via EPIDURAL

## 2018-09-10 MED ORDER — IOPAMIDOL (ISOVUE-M 200) INJECTION 41%
1.0000 mL | Freq: Once | INTRAMUSCULAR | Status: AC
  Administered 2018-09-10: 1 mL via EPIDURAL

## 2018-09-10 NOTE — Discharge Instructions (Signed)

## 2018-09-13 ENCOUNTER — Encounter: Payer: Self-pay | Admitting: Physical Therapy

## 2018-09-13 ENCOUNTER — Ambulatory Visit: Payer: 59 | Admitting: Physical Therapy

## 2018-09-13 DIAGNOSIS — R262 Difficulty in walking, not elsewhere classified: Secondary | ICD-10-CM | POA: Diagnosis not present

## 2018-09-13 DIAGNOSIS — M5416 Radiculopathy, lumbar region: Secondary | ICD-10-CM | POA: Diagnosis not present

## 2018-09-13 DIAGNOSIS — M6281 Muscle weakness (generalized): Secondary | ICD-10-CM

## 2018-09-13 DIAGNOSIS — M25561 Pain in right knee: Secondary | ICD-10-CM | POA: Diagnosis not present

## 2018-09-13 DIAGNOSIS — G8929 Other chronic pain: Secondary | ICD-10-CM

## 2018-09-13 DIAGNOSIS — M5442 Lumbago with sciatica, left side: Principal | ICD-10-CM

## 2018-09-13 NOTE — Therapy (Signed)
[place], [name], [address] Phone: [phone]   Fax:  [phone]  Physical Therapy Treatment  Patient Details  Name: [name] MRN: [id number] Date of Birth: 09-12-1961 Referring Provider (PT): [name], MD   Encounter Date: 09/13/2018  PT End of Session - 09/13/18 1350    Visit Number  4    Number of Visits  12    Date for PT Re-Evaluation  10/05/18    Authorization Type  [employer] [place] [employer]    PT Start Time  1101    PT Stop Time  1155    PT Time Calculation (min)  54 min    Activity Tolerance  Patient tolerated treatment well    Behavior During Therapy  [facility] for tasks assessed/performed       Past Medical History:  Diagnosis Date  . Anxiety   . Back pain    related to spinal stenosis and disc problem, radiates down left buttocks to leg., weakness occ.  [name] Dyspnea   . Headache   . Hyperlipidemia   . Hypertension   . PONV (postoperative nausea and vomiting)   . Vaginal foreign object    "Uses Femring"    Past Surgical History:  Procedure Laterality Date  . ABDOMINAL HYSTERECTOMY    . CARDIAC CATHETERIZATION N/A 04/18/2015   Procedure: Left Heart Cath and Coronary Angiography;  Surgeon: [name], MD;  Location: [facility] [facility];  Service: Cardiovascular;  Laterality: N/A;  . FOOT SURGERY Bilateral    [place] "bunion,bone spur, tendon" (1) -6'16, (1)-10'16  . IR EPIDUROGRAPHY  07/21/2018  . LUMBAR LAMINECTOMY/DECOMPRESSION MICRODISCECTOMY Bilateral 12/28/2015   Procedure: MICRO LUMBAR DECOMPRESSION L4 - L5 BILATERALLY;  Surgeon: [name], MD;  Location[facility];  Service: Orthopedics;  Laterality: Bilateral;  . LUMBAR LAMINECTOMY/DECOMPRESSION MICRODISCECTOMY Bilateral 03/04/2018   Procedure: Revision of Microlumbar Decompression Bilateral Lumbar Four-Five;  Surgeon: [name], MD;  Location: [place];  Service: Orthopedics;  Laterality: Bilateral;  90 mins  .  WISDOM TOOTH EXTRACTION    . WOUND EXPLORATION N/A 03/04/2018   Procedure: EXPLORATION OF LUMBAR DECOMPRESSION WOUND;  Surgeon: [name], MD;  Location: [facility];  Service: Orthopedics;  Laterality: N/A;    There were no vitals filed for this visit.  Subjective Assessment - 09/13/18 1106    Subjective  Patient reported she had her injection into lumbar on Friday. Has had burning ever since but feel that pain has improved overall. Has been nasuated ever since but per pt MD said it wasn't uncommon to experience. iNJECTION done with Dr. Maxie Better.[name]  No pain in mouth today.     Patient is accompained by:  Family member    Pertinent History  previous back decompression and laminectomy on 12/28/15 at L4-L5  with redo decompression surgery on May 9,2019, Bil foot surgery    Limitations  Standing;Walking;Sitting;Other (comment)    How long can you sit comfortably?  can't sit for long     How long can you stand comfortably?  can't stand for long     How long can you walk comfortably?  short community distances     Patient Stated Goals  decrease pain and radicuopathy in low back to return to walking     Currently in Pain?  Yes    Pain Score  5     Pain Location  Back    Pain Orientation  Lower;Left    Pain Descriptors / Indicators  Aching  Pain Type  Chronic pain    Pain Radiating Towards  radiates down bilateral buttock and into legs     Pain Onset  1 to 4 weeks ago    Pain Frequency  Constant    Aggravating Factors   prolonged positioning    Pain Relieving Factors  heat only relief at this point     Effect of Pain on Daily Activities  decreased mobility     Multiple Pain Sites  No                       [facility] Adult PT Treatment/Exercise - 09/13/18 0001      Lumbar Exercises: Stretches   Lower Trunk Rotation Limitations  x10    Other Lumbar Stretch Exercise  manual hip flexion 2x20 sec bil.       Lumbar Exercises: Seated   Other Seated Lumbar Exercises  x10 pelvic tilts on air  ex pad feet on step ; LAQ x10 Bil on air ex pad; Heel/toe raises x10 Bil on air ex pad; x15 swiss ball presses seated on air ex pad 5sec holds ; seated marching on air ex pad x10 bil; clam shells x15 with red t-band      Lumbar Exercises: Supine   Other Supine Lumbar Exercises  leg lengthener x10 atl. LE 5sec holds; leg press x10 alt. LE 5 sec holds; shoulder press x10 5 sec holds      Modalities   Modalities  --      Electrical Stimulation   Electrical Stimulation Location  Left paraspinals    Electrical Stimulation Action  IFC x10 min    Electrical Stimulation Parameters  6    Electrical Stimulation Goals  Pain             PT Education - 09/13/18 1344    Education Details  HEP; tens unit; symptom manangement; changing position often to decrease stiffness/pulling    Person(s) Educated  Patient    Methods  Explanation;Tactile cues;Verbal cues;Handout;Demonstration    Comprehension  Verbalized understanding;Returned demonstration       PT Short Term Goals - 09/13/18 1403      PT SHORT TERM GOAL #1   Title  She will increase L hip flexion by 10 deg    Time  3    Period  Weeks    Status  Unable to assess      PT SHORT TERM GOAL #2   Title  Pt will able to sit >30 minutes without increase in LBP    Time  3    Period  Weeks    Status  Unable to assess      PT SHORT TERM GOAL #3   Title  Increase L hip abduction strength to 4-/5    Time  3    Period  Weeks    Status  Unable to assess      PT SHORT TERM GOAL #4   Title  She will demo correctl flexion of RT leg with walking with SPC without LOB    Time  4    Period  Weeks    Status  On-going        PT Long Term Goals - 09/13/18 1514      PT LONG TERM GOAL #1   Title  Pt will improve bil LE hip & knee strength to 4/5 in order to allow for normalization of gait.     Time  6  Period  Weeks    Status  On-going      PT LONG TERM GOAL #2   Title  Pt will be able to stand >30 minutes without increase in pain in  order to complete ADLs    Time  6    Period  Weeks    Status  On-going      PT LONG TERM GOAL #3   Title  Pt will improve Bil LE hip flexion by 20 deg in order to don and doff socks and shoes     Time  6    Period  Weeks    Status  On-going            Plan - 09/13/18 1351    Clinical Impression Statement  Pt with overall improvements in low back pain, contributing it to steroid injection she recieved on Friday. Split time between supine and seated exercises to decrease stiffnes and pain in supine. Still too senssitive in paraspinals for soft tissue mobilizations. Progressed core stability on air ex pad with LE activities to increase LE strengthening. Pt tolerated treatment well and was instructed to add gentle lower trunk rotations to HEP, in pain free ranges.     History and Personal Factors relevant to plan of care:  redo laminectomy/disectomy L4-5 surgery    Clinical Presentation  Evolving    Clinical Presentation due to:  pain in low back and bil LE weakness progresively effecting mobility     Clinical Decision Making  Moderate    Rehab Potential  Fair    PT Frequency  2x / week    PT Duration  6 weeks    PT Treatment/Interventions  Manual techniques;Patient/family education;Therapeutic exercise;Therapeutic activities;Cryotherapy;Gait training;Stair training;ADLs/Self Care Home Management;Electrical Stimulation;Iontophoresis 4mg /ml Dexamethasone;Moist Heat;Ultrasound;Neuromuscular re-education;Functional mobility training;Passive range of motion;Taping;Joint Manipulations;Traction;Balance training    PT Next Visit Plan  Utilize wedge and 3+ pillows to improve comfort. Instructions on how to purchase tens unit if interested. Assess response to decompressive series from last visit. STM, myofascial release to lumbar paraspinals if able to tolerate positioning, ball squeezes,isometric hip flexion, light core strengthening, hamstring stretch, trunk rotations     PT Home Exercise Plan   seated ab sets and ball squeezes; leg lengthener and leg presses     Consulted and Agree with Plan of Care  Patient       Patient will benefit from skilled therapeutic intervention in order to improve the following deficits and impairments:  Pain, Postural dysfunction, Difficulty walking, Decreased strength, Decreased activity tolerance, Abnormal gait, Increased muscle spasms, Decreased endurance, Decreased range of motion, Decreased balance  Visit Diagnosis: Chronic left-sided low back pain with left-sided sciatica  Radiculopathy, lumbar region  Muscle weakness (generalized)  Difficulty in walking, not elsewhere classified     Problem List Patient Active Problem List   Diagnosis Date Noted  . Lumbar spinal stenosis   . Radicular pain   . Gait disorder   . Difficulty in urination 07/13/2018  . Back pain 07/12/2018  . Hyperlipidemia 04/16/2018  . Generalized anxiety disorder with panic attacks 04/16/2018  . AKI (acute kidney injury) ([place])   . Benign essential HTN   . Depression with anxiety   . Lumbar radiculopathy 03/09/2018  . Paraparesis ([place]) 03/09/2018  . Essential hypertension   . Chronic nonintractable headache   . Leukocytosis   . Acute blood loss anemia   . Postoperative pain   . Generalized weakness   . Spinal stenosis at L4-L5 level 03/04/2018  . Spinal  stenosis of lumbar region 12/28/2015  . Chest pain 04/16/2015    Carney Living PT DPT  09/13/2018, 4:00 PM   Einar Crow SPT  09/13/2018   During this treatment session, the therapist was present, participating in and directing the treatment.    [place], [name], [address] Phone: [phone]   Fax:  [phone]  Name: [name] MRN: [id number] Date of Birth: Mar 12, 1961

## 2018-09-13 NOTE — Patient Instructions (Signed)
Supine Lower Trunk Rotation reps: 10 sets: 1 daily: 1 weekly: 7   Exercise image step 1   Exercise image step 2  Setup  Begin lying on your back with your knees bent and feet resting on the floor. Movement  Keeping your back flat, slowly rotate your knees down towards the floor until you feel a stretch in your trunk and hold. Tip  Make sure that your back and shoulders stay in contact with the floor. Hooklying Isometric Clamshell reps: 10 sets: 3 daily: 1 weekly: 7   Exercise image step 1   Exercise image step 2  Setup  Begin by lying on your back with your knees bent and feet resting on the floor, with a resistance band or loop secured around your knees.  Movement  Keep one leg stationary as you draw your other leg outward, and hold. Tip  Make sure to not to arch your low back during the exercise.

## 2018-09-16 ENCOUNTER — Ambulatory Visit: Payer: 59 | Admitting: Physical Therapy

## 2018-09-16 ENCOUNTER — Encounter: Payer: Self-pay | Admitting: Physical Therapy

## 2018-09-16 DIAGNOSIS — M5416 Radiculopathy, lumbar region: Secondary | ICD-10-CM | POA: Diagnosis not present

## 2018-09-16 DIAGNOSIS — G8929 Other chronic pain: Secondary | ICD-10-CM | POA: Diagnosis not present

## 2018-09-16 DIAGNOSIS — M6281 Muscle weakness (generalized): Secondary | ICD-10-CM

## 2018-09-16 DIAGNOSIS — R262 Difficulty in walking, not elsewhere classified: Secondary | ICD-10-CM

## 2018-09-16 DIAGNOSIS — M5442 Lumbago with sciatica, left side: Secondary | ICD-10-CM | POA: Diagnosis not present

## 2018-09-16 DIAGNOSIS — M25561 Pain in right knee: Secondary | ICD-10-CM | POA: Diagnosis not present

## 2018-09-16 NOTE — Therapy (Signed)
[name], [name], [address] Phone: [phone]   Fax:  [phone]  Physical Therapy Treatment  Patient Details  Name: [name] MRN: [id number] Date of Birth: 1961-04-21 Referring Provider (PT): [name], MD   Encounter Date: 09/16/2018  PT End of Session - 09/16/18 1057    Visit Number  5    Number of Visits  12    Date for PT Re-Evaluation  10/05/18    Authorization Type  [place] [employer]    PT Start Time  1055    PT Stop Time  1150    PT Time Calculation (min)  55 min       Past Medical History:  Diagnosis Date  . Anxiety   . Back pain    related to spinal stenosis and disc problem, radiates down left buttocks to leg., weakness occ.  [name] Dyspnea   . Headache   . Hyperlipidemia   . Hypertension   . PONV (postoperative nausea and vomiting)   . Vaginal foreign object    "Uses Femring"    Past Surgical History:  Procedure Laterality Date  . ABDOMINAL HYSTERECTOMY    . CARDIAC CATHETERIZATION N/A 04/18/2015   Procedure: Left Heart Cath and Coronary Angiography;  Surgeon: [name], MD;  Location: [facility];  Service: Cardiovascular;  Laterality: N/A;  . FOOT SURGERY Bilateral    [place] "bunion,bone spur, tendon" (1) -6'16, (1)-10'16  . IR EPIDUROGRAPHY  07/21/2018  . LUMBAR LAMINECTOMY/DECOMPRESSION MICRODISCECTOMY Bilateral 12/28/2015   Procedure: MICRO LUMBAR DECOMPRESSION L4 - L5 BILATERALLY;  Surgeon: [name], MD;  Location[facility];  Service: Orthopedics;  Laterality: Bilateral;  . LUMBAR LAMINECTOMY/DECOMPRESSION MICRODISCECTOMY Bilateral 03/04/2018   Procedure: Revision of Microlumbar Decompression Bilateral Lumbar Four-Five;  Surgeon: [name], MD;  Location: [facility];  Service: Orthopedics;  Laterality: Bilateral;  90 mins  . WISDOM TOOTH EXTRACTION    . WOUND EXPLORATION N/A 03/04/2018   Procedure: EXPLORATION OF LUMBAR DECOMPRESSION WOUND;   Surgeon: [name], MD;  Location: [facility];  Service: Orthopedics;  Laterality: N/A;    There were no vitals filed for this visit.  Subjective Assessment - 09/16/18 1058    Subjective  Having a burning sensation in lower back at surgery area.     Currently in Pain?  Yes    Pain Score  4     Pain Location  Back    Pain Orientation  Mid;Lower    Pain Descriptors / Indicators  Burning    Aggravating Factors   constant    Pain Relieving Factors  IFC                        [facility] Adult PT Treatment/Exercise - 09/16/18 0001      Exercises   Exercises  Lumbar      Lumbar Exercises: Stretches   Lower Trunk Rotation Limitations  x10      Lumbar Exercises: Seated   Other Seated Lumbar Exercises  on airex, pelvic tilits, LAQ, Marching, heel/toe raises       Lumbar Exercises: Supine   Clam  20 reps    Clam Limitations  red    Other Supine Lumbar Exercises  Ball squeezes 2x10 with cues for core engagement       Moist Heat Therapy   Number Minutes Moist Heat  15 Minutes    Moist Heat Location  Lumbar Spine  Acupuncturist Location  Left paraspinals    Electrical Stimulation Action  IFC x 15 min    Electrical Stimulation Parameters  to tolerance     Electrical Stimulation Goals  Pain               PT Short Term Goals - 09/13/18 1403      PT SHORT TERM GOAL #1   Title  She will increase L hip flexion by 10 deg    Time  3    Period  Weeks    Status  Unable to assess      PT SHORT TERM GOAL #2   Title  Pt will able to sit >30 minutes without increase in LBP    Time  3    Period  Weeks    Status  Unable to assess      PT SHORT TERM GOAL #3   Title  Increase L hip abduction strength to 4-/5    Time  3    Period  Weeks    Status  Unable to assess      PT SHORT TERM GOAL #4   Title  She will demo correctl flexion of RT leg with walking with SPC without LOB    Time  4    Period  Weeks    Status  On-going         PT Long Term Goals - 09/13/18 1514      PT LONG TERM GOAL #1   Title  Pt will improve bil LE hip & knee strength to 4/5 in order to allow for normalization of gait.     Time  6    Period  Weeks    Status  On-going      PT LONG TERM GOAL #2   Title  Pt will be able to stand >30 minutes without increase in pain in order to complete ADLs    Time  6    Period  Weeks    Status  On-going      PT LONG TERM GOAL #3   Title  Pt will improve Bil LE hip flexion by 20 deg in order to don and doff socks and shoes     Time  6    Period  Weeks    Status  On-going            Plan - 09/16/18 1140    Clinical Impression Statement  Pt reports burning sensation in lumbar since injection. Worked on supine core exercises and upaon sitting she reported an increase in burning. After seated therex on airex pad the burning decreased. Repeated IFC with HMP added per her request. Very little IFC mA tolerated. Suggested she desensitize her lumbar spine with various textures.     PT Next Visit Plan  Utilize wedge and 3+ pillows to improve comfort. Instructions on how to purchase tens unit if interested. Assess response to decompressive series from last visit. STM, myofascial release to lumbar paraspinals if able to tolerate positioning, ball squeezes,isometric hip flexion, light core strengthening, hamstring stretch, trunk rotations     PT Home Exercise Plan  seated ab sets and ball squeezes; leg lengthener and leg presses     Consulted and Agree with Plan of Care  Patient       Patient will benefit from skilled therapeutic intervention in order to improve the following deficits and impairments:  Pain, Postural dysfunction, Difficulty walking, Decreased strength, Decreased activity tolerance,  Abnormal gait, Increased muscle spasms, Decreased endurance, Decreased range of motion, Decreased balance  Visit Diagnosis: Chronic left-sided low back pain with left-sided sciatica  Radiculopathy, lumbar  region  Muscle weakness (generalized)  Difficulty in walking, not elsewhere classified     Problem List Patient Active Problem List   Diagnosis Date Noted  . Lumbar spinal stenosis   . Radicular pain   . Gait disorder   . Difficulty in urination 07/13/2018  . Back pain 07/12/2018  . Hyperlipidemia 04/16/2018  . Generalized anxiety disorder with panic attacks 04/16/2018  . AKI (acute kidney injury) ([place])   . Benign essential HTN   . Depression with anxiety   . Lumbar radiculopathy 03/09/2018  . Paraparesis ([place]) 03/09/2018  . Essential hypertension   . Chronic nonintractable headache   . Leukocytosis   . Acute blood loss anemia   . Postoperative pain   . Generalized weakness   . Spinal stenosis at L4-L5 level 03/04/2018  . Spinal stenosis of lumbar region 12/28/2015  . Chest pain 04/16/2015    Dorene Ar, PTA 09/16/2018, 11:46 AM  [facility] [address] [place], [name], [address] Phone: [phone]   Fax:  [phone]  Name: RITHA SAMPEDRO MRN: [id number] Date of Birth: Dec 05, 1960

## 2018-09-20 ENCOUNTER — Ambulatory Visit: Payer: 59 | Admitting: Physical Therapy

## 2018-09-20 ENCOUNTER — Encounter: Payer: Self-pay | Admitting: Physical Therapy

## 2018-09-20 DIAGNOSIS — R262 Difficulty in walking, not elsewhere classified: Secondary | ICD-10-CM

## 2018-09-20 DIAGNOSIS — M5442 Lumbago with sciatica, left side: Secondary | ICD-10-CM | POA: Diagnosis not present

## 2018-09-20 DIAGNOSIS — M5416 Radiculopathy, lumbar region: Secondary | ICD-10-CM | POA: Diagnosis not present

## 2018-09-20 DIAGNOSIS — G8929 Other chronic pain: Secondary | ICD-10-CM | POA: Diagnosis not present

## 2018-09-20 DIAGNOSIS — M6281 Muscle weakness (generalized): Secondary | ICD-10-CM | POA: Diagnosis not present

## 2018-09-20 DIAGNOSIS — M25561 Pain in right knee: Secondary | ICD-10-CM | POA: Diagnosis not present

## 2018-09-20 NOTE — Therapy (Signed)
[facility] [place], [name], [address] Phone: [phone]   Fax:  [phone]  Physical Therapy Treatment  Patient Details  Name: [name] MRN: [id number] Date of Birth: 01/01/1961 Referring Provider (PT): [name], MD   Encounter Date: 09/20/2018  PT End of Session - 09/20/18 1103    Visit Number  6    Number of Visits  12    Date for PT Re-Evaluation  10/05/18    Authorization Type  [place] [employer]    PT Start Time  1100    PT Stop Time  1200    PT Time Calculation (min)  60 min       Past Medical History:  Diagnosis Date  . Anxiety   . Back pain    related to spinal stenosis and disc problem, radiates down left buttocks to leg., weakness occ.  [name] Dyspnea   . Headache   . Hyperlipidemia   . Hypertension   . PONV (postoperative nausea and vomiting)   . Vaginal foreign object    "Uses Femring"    Past Surgical History:  Procedure Laterality Date  . ABDOMINAL HYSTERECTOMY    . CARDIAC CATHETERIZATION N/A 04/18/2015   Procedure: Left Heart Cath and Coronary Angiography;  Surgeon: [name], MD;  Location: [facility];  Service: Cardiovascular;  Laterality: N/A;  . FOOT SURGERY Bilateral    [place] "bunion,bone spur, tendon" (1) -6'16, (1)-10'16  . IR EPIDUROGRAPHY  07/21/2018  . LUMBAR LAMINECTOMY/DECOMPRESSION MICRODISCECTOMY Bilateral 12/28/2015   Procedure: MICRO LUMBAR DECOMPRESSION L4 - L5 BILATERALLY;  Surgeon: [name], MD;  [facility];  Service: Orthopedics;  Laterality: Bilateral;  . LUMBAR LAMINECTOMY/DECOMPRESSION MICRODISCECTOMY Bilateral 03/04/2018   Procedure: Revision of Microlumbar Decompression Bilateral Lumbar Four-Five;  Surgeon: [name], MD;  Location: [facility];  Service: Orthopedics;  Laterality: Bilateral;  90 mins  . WISDOM TOOTH EXTRACTION    . WOUND EXPLORATION N/A 03/04/2018   Procedure: EXPLORATION OF LUMBAR DECOMPRESSION WOUND;   Surgeon: [name], MD;  Location: [facility];  Service: Orthopedics;  Laterality: N/A;    There were no vitals filed for this visit.  Subjective Assessment - 09/20/18 1102    Subjective  Still having a burning sensation near incision. I have an appt with MD after the holiday.     Currently in Pain?  Yes    Pain Score  6     Pain Location  Back    Pain Orientation  Mid;Lower    Pain Descriptors / Indicators  Burning                       [facility] Adult PT Treatment/Exercise - 09/20/18 0001      Neuro Re-ed    Neuro Re-ed Details   standing narrow stance CGA/ Min Assist , challenged with head nods and turns.       Lumbar Exercises: Stretches   Lower Trunk Rotation Limitations  x10      Lumbar Exercises: Seated   Other Seated Lumbar Exercises  on airex, pelvic tilits, LAQ, Marching, heel/toe raises       Lumbar Exercises: Supine   Other Supine Lumbar Exercises  physioball press x 10 , then x 10 each obliques       Knee/Hip Exercises: Standing   Other Standing Knee Exercises  Marching at RW - able to increase AROM       Moist Heat Therapy   Number  Minutes Moist Heat  15 Minutes    Moist Heat Location  Lumbar Spine      Electrical Stimulation   Electrical Stimulation Location  bilateral paraspinals    Electrical Stimulation Action  IFC x 15 min    Electrical Stimulation Parameters  6 ma     Electrical Stimulation Goals  Pain               PT Short Term Goals - 09/13/18 1403      PT SHORT TERM GOAL #1   Title  She will increase L hip flexion by 10 deg    Time  3    Period  Weeks    Status  Unable to assess      PT SHORT TERM GOAL #2   Title  Pt will able to sit >30 minutes without increase in LBP    Time  3    Period  Weeks    Status  Unable to assess      PT SHORT TERM GOAL #3   Title  Increase L hip abduction strength to 4-/5    Time  3    Period  Weeks    Status  Unable to assess      PT SHORT TERM GOAL #4   Title  She will demo  correctl flexion of RT leg with walking with SPC without LOB    Time  4    Period  Weeks    Status  On-going        PT Long Term Goals - 09/13/18 1514      PT LONG TERM GOAL #1   Title  Pt will improve bil LE hip & knee strength to 4/5 in order to allow for normalization of gait.     Time  6    Period  Weeks    Status  On-going      PT LONG TERM GOAL #2   Title  Pt will be able to stand >30 minutes without increase in pain in order to complete ADLs    Time  6    Period  Weeks    Status  On-going      PT LONG TERM GOAL #3   Title  Pt will improve Bil LE hip flexion by 20 deg in order to don and doff socks and shoes     Time  6    Period  Weeks    Status  On-going            Plan - 09/20/18 1130    Clinical Impression Statement  pt reports feeling fine after last session.     PT Next Visit Plan  Utilize wedge and 3+ pillows to improve comfort. Instructions on how to purchase tens unit if interested. Assess response to decompressive series from last visit. STM, myofascial release to lumbar paraspinals if able to tolerate positioning, ball squeezes,isometric hip flexion, light core strengthening, hamstring stretch, trunk rotations     PT Home Exercise Plan  seated ab sets and ball squeezes; leg lengthener and leg presses     Consulted and Agree with Plan of Care  Patient       Patient will benefit from skilled therapeutic intervention in order to improve the following deficits and impairments:  Pain, Postural dysfunction, Difficulty walking, Decreased strength, Decreased activity tolerance, Abnormal gait, Increased muscle spasms, Decreased endurance, Decreased range of motion, Decreased balance  Visit Diagnosis: Chronic left-sided low back pain with left-sided sciatica  Radiculopathy,  lumbar region  Muscle weakness (generalized)  Difficulty in walking, not elsewhere classified     Problem List Patient Active Problem List   Diagnosis Date Noted  . Lumbar spinal  stenosis   . Radicular pain   . Gait disorder   . Difficulty in urination 07/13/2018  . Back pain 07/12/2018  . Hyperlipidemia 04/16/2018  . Generalized anxiety disorder with panic attacks 04/16/2018  . AKI (acute kidney injury) ([place])   . Benign essential HTN   . Depression with anxiety   . Lumbar radiculopathy 03/09/2018  . Paraparesis ([place]) 03/09/2018  . Essential hypertension   . Chronic nonintractable headache   . Leukocytosis   . Acute blood loss anemia   . Postoperative pain   . Generalized weakness   . Spinal stenosis at L4-L5 level 03/04/2018  . Spinal stenosis of lumbar region 12/28/2015  . Chest pain 04/16/2015    Dorene Ar, PTA 09/20/2018, 12:38 PM  [place], [name], [address] Phone: [phone]   Fax:  [phone]  Name: [name] MRN: [id number] Date of Birth: Dec 18, 1960

## 2018-09-27 ENCOUNTER — Encounter: Payer: Self-pay | Admitting: Physical Therapy

## 2018-09-27 ENCOUNTER — Ambulatory Visit: Payer: 59 | Attending: Specialist | Admitting: Physical Therapy

## 2018-09-27 DIAGNOSIS — M5416 Radiculopathy, lumbar region: Secondary | ICD-10-CM | POA: Diagnosis not present

## 2018-09-27 DIAGNOSIS — Z9181 History of falling: Secondary | ICD-10-CM | POA: Diagnosis not present

## 2018-09-27 DIAGNOSIS — M6281 Muscle weakness (generalized): Secondary | ICD-10-CM | POA: Insufficient documentation

## 2018-09-27 DIAGNOSIS — R2681 Unsteadiness on feet: Secondary | ICD-10-CM | POA: Diagnosis not present

## 2018-09-27 DIAGNOSIS — R29818 Other symptoms and signs involving the nervous system: Secondary | ICD-10-CM | POA: Diagnosis not present

## 2018-09-27 DIAGNOSIS — G8929 Other chronic pain: Secondary | ICD-10-CM | POA: Diagnosis not present

## 2018-09-27 DIAGNOSIS — M5442 Lumbago with sciatica, left side: Secondary | ICD-10-CM | POA: Insufficient documentation

## 2018-09-27 DIAGNOSIS — R262 Difficulty in walking, not elsewhere classified: Secondary | ICD-10-CM | POA: Insufficient documentation

## 2018-09-27 NOTE — Therapy (Signed)
[place], [name], [address] Phone: [phone]   Fax:  [phone]  Physical Therapy Treatment  Patient Details  Name: [name] MRN: [id number] Date of Birth: November 04, 1960 Referring Provider (PT): [name], MD   Encounter Date: 09/27/2018  PT End of Session - 09/27/18 1116    Visit Number  7    Number of Visits  12    Date for PT Re-Evaluation  10/05/18    Authorization Type  [place] [employer]    PT Start Time  1104    PT Stop Time  1155    PT Time Calculation (min)  51 min       Past Medical History:  Diagnosis Date  . Anxiety   . Back pain    related to spinal stenosis and disc problem, radiates down left buttocks to leg., weakness occ.  [name] Dyspnea   . Headache   . Hyperlipidemia   . Hypertension   . PONV (postoperative nausea and vomiting)   . Vaginal foreign object    "Uses Femring"    Past Surgical History:  Procedure Laterality Date  . ABDOMINAL HYSTERECTOMY    . CARDIAC CATHETERIZATION N/A 04/18/2015   Procedure: Left Heart Cath and Coronary Angiography;  Surgeon: [name], MD;  Location: [facility];  Service: Cardiovascular;  Laterality: N/A;  . FOOT SURGERY Bilateral    [place] "bunion,bone spur, tendon" (1) -6'16, (1)-10'16  . IR EPIDUROGRAPHY  07/21/2018  . LUMBAR LAMINECTOMY/DECOMPRESSION MICRODISCECTOMY Bilateral 12/28/2015   Procedure: MICRO LUMBAR DECOMPRESSION L4 - L5 BILATERALLY;  Surgeon: [name], MD;  Location: [facility];  Service: Orthopedics;  Laterality: Bilateral;  . LUMBAR LAMINECTOMY/DECOMPRESSION MICRODISCECTOMY Bilateral 03/04/2018   Procedure: Revision of Microlumbar Decompression Bilateral Lumbar Four-Five;  Surgeon: [name], MD;  Location: [facility];  Service: Orthopedics;  Laterality: Bilateral;  90 mins  . WISDOM TOOTH EXTRACTION    . WOUND EXPLORATION N/A 03/04/2018   Procedure: EXPLORATION OF LUMBAR DECOMPRESSION WOUND;   Surgeon: [name], MD;  Location: [facility];  Service: Orthopedics;  Laterality: N/A;    There were no vitals filed for this visit.  Subjective Assessment - 09/27/18 1109    Subjective  Burning sensation still there at incision. Left leg is hurting alot at night. The day before the holiday  I could barely walk. My husband massaged my leg and it feels better. Not as bad as it was now.     Currently in Pain?  Yes    Pain Score  5     Pain Location  Back    Pain Orientation  Mid;Lower    Pain Descriptors / Indicators  Burning    Pain Radiating Towards  discomfort in both legs all the way to feet.     Aggravating Factors   constant     Pain Relieving Factors  IFC                        [facility] Adult PT Treatment/Exercise - 09/27/18 0001      Lumbar Exercises: Stretches   Single Knee to Chest Stretch  3 reps;20 seconds    Lower Trunk Rotation Limitations  x10      Lumbar Exercises: Supine   Clam  20 reps    Clam Limitations  red    Other Supine Lumbar Exercises  physioball press x 10 , then x 10 each obliques  Moist Heat Therapy   Number Minutes Moist Heat  15 Minutes    Moist Heat Location  Lumbar Spine      Electrical Stimulation   Electrical Stimulation Location  bilateral paraspinals    Electrical Stimulation Action  IFC x 15 min    Electrical Stimulation Parameters  106ma     Electrical Stimulation Goals  Pain               PT Short Term Goals - 09/13/18 1403      PT SHORT TERM GOAL #1   Title  She will increase L hip flexion by 10 deg    Time  3    Period  Weeks    Status  Unable to assess      PT SHORT TERM GOAL #2   Title  Pt will able to sit >30 minutes without increase in LBP    Time  3    Period  Weeks    Status  Unable to assess      PT SHORT TERM GOAL #3   Title  Increase L hip abduction strength to 4-/5    Time  3    Period  Weeks    Status  Unable to assess      PT SHORT TERM GOAL #4   Title  She will demo correctl  flexion of RT leg with walking with SPC without LOB    Time  4    Period  Weeks    Status  On-going        PT Long Term Goals - 09/13/18 1514      PT LONG TERM GOAL #1   Title  Pt will improve bil LE hip & knee strength to 4/5 in order to allow for normalization of gait.     Time  6    Period  Weeks    Status  On-going      PT LONG TERM GOAL #2   Title  Pt will be able to stand >30 minutes without increase in pain in order to complete ADLs    Time  6    Period  Weeks    Status  On-going      PT LONG TERM GOAL #3   Title  Pt will improve Bil LE hip flexion by 20 deg in order to don and doff socks and shoes     Time  6    Period  Weeks    Status  On-going            Plan - 09/27/18 1228    Clinical Impression Statement  Pt arrives with RW and difficulty advancing RLE without dragging toe. She is wearing AFO. She reports increased left leg pain from knee to foot bothe anterior and posterior that started Wednesday evening. After massage and elevation it is feeling better but keeping her awake at night. She has called MD to make an appointment. She has one more visit in PT POC. Contninued with gentle core and IFC per pt request. She is aware of her limited progress with HEP and may agree with HOLD after next visit and consult MD.     PT Next Visit Plan  check goals, FOTO if needed, possible HOLD to F/U with MD>     PT Home Exercise Plan  seated ab sets and ball squeezes; leg lengthener and leg presses     Consulted and Agree with Plan of Care  Patient  Patient will benefit from skilled therapeutic intervention in order to improve the following deficits and impairments:  Pain, Postural dysfunction, Difficulty walking, Decreased strength, Decreased activity tolerance, Abnormal gait, Increased muscle spasms, Decreased endurance, Decreased range of motion, Decreased balance  Visit Diagnosis: Chronic left-sided low back pain with left-sided sciatica  Radiculopathy, lumbar  region  Muscle weakness (generalized)  Difficulty in walking, not elsewhere classified     Problem List Patient Active Problem List   Diagnosis Date Noted  . Lumbar spinal stenosis   . Radicular pain   . Gait disorder   . Difficulty in urination 07/13/2018  . Back pain 07/12/2018  . Hyperlipidemia 04/16/2018  . Generalized anxiety disorder with panic attacks 04/16/2018  . AKI (acute kidney injury) ([place])   . Benign essential HTN   . Depression with anxiety   . Lumbar radiculopathy 03/09/2018  . Paraparesis ([place]) 03/09/2018  . Essential hypertension   . Chronic nonintractable headache   . Leukocytosis   . Acute blood loss anemia   . Postoperative pain   . Generalized weakness   . Spinal stenosis at L4-L5 level 03/04/2018  . Spinal stenosis of lumbar region 12/28/2015  . Chest pain 04/16/2015    Dorene Ar, PTA 09/27/2018, 12:33 PM  [facility] [address] [place], [name], [address] Phone: [phone]   Fax:  [phone]  Name: [name] MRN: [id number] Date of Birth: [birth year]

## 2018-09-29 IMAGING — CR DG LUMBAR SPINE COMPLETE 4+V
5 series · 5 of 5 positions shown · non-contrast
Comparison: 03/04/2018

CLINICAL DATA: Hx laminectomy 03/04/2018, hx of a fall 3 days ago,
low back pain

EXAM:
LUMBAR SPINE - COMPLETE 4+ VIEW

[t l-spine a.p.]
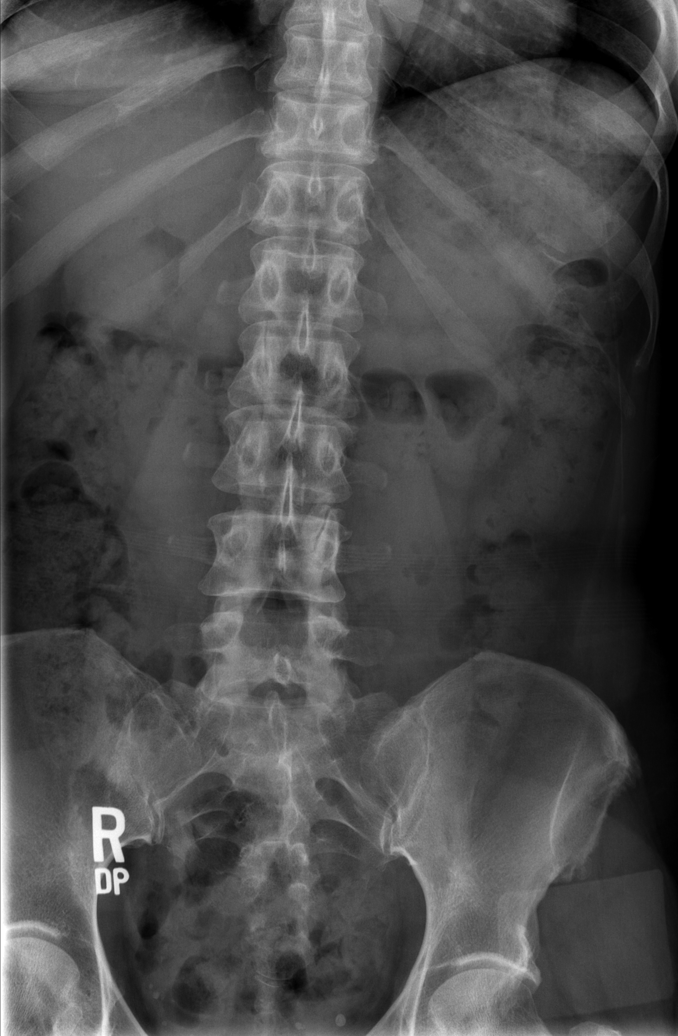

[t l-spine oblique exposure (1 of 2)]
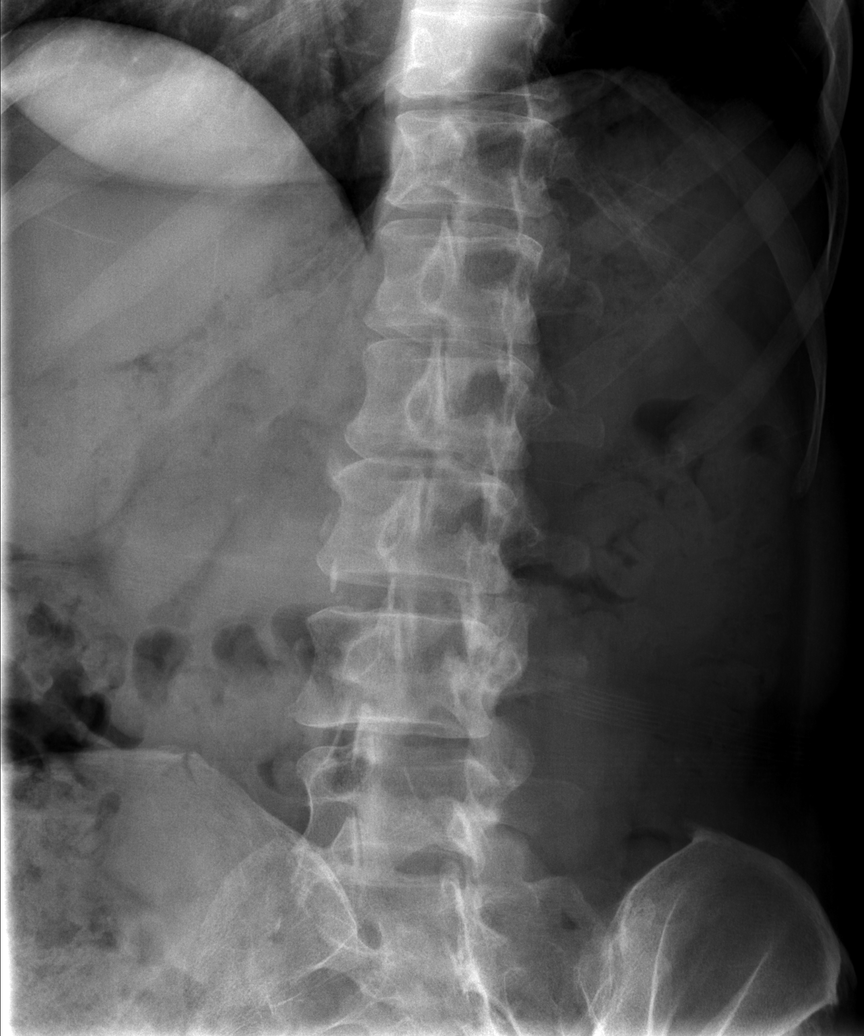

[t l-spine oblique exposure (2 of 2)]
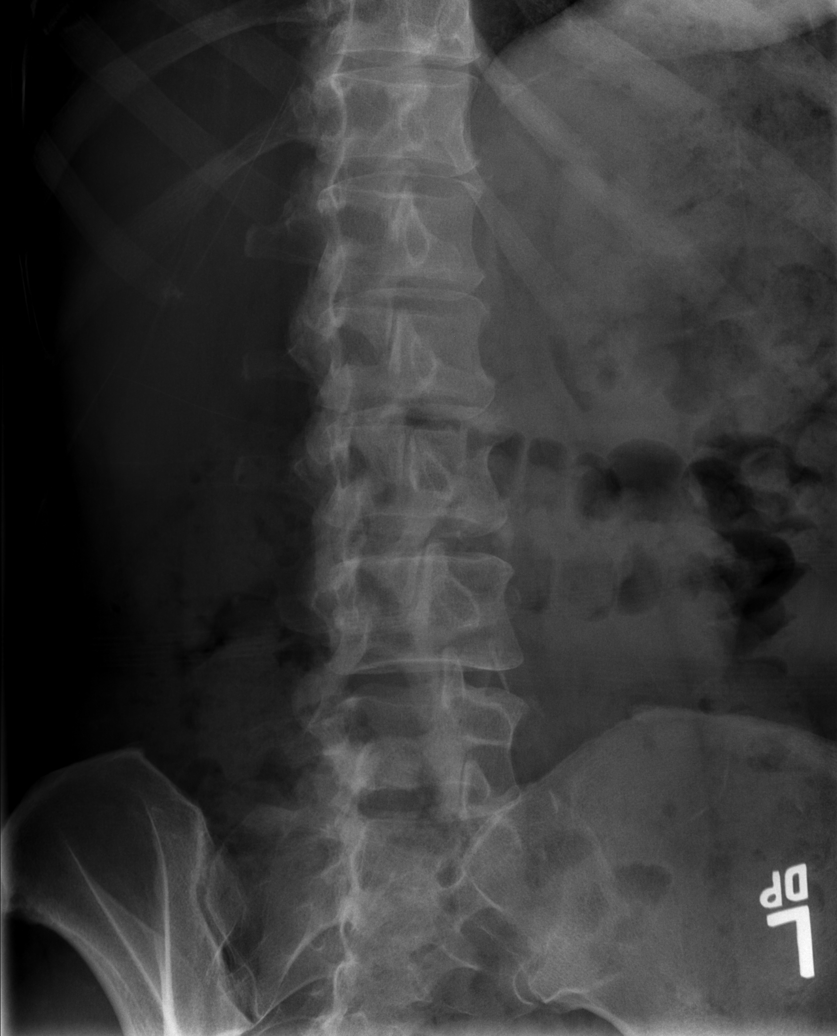

[t l-spine lat]
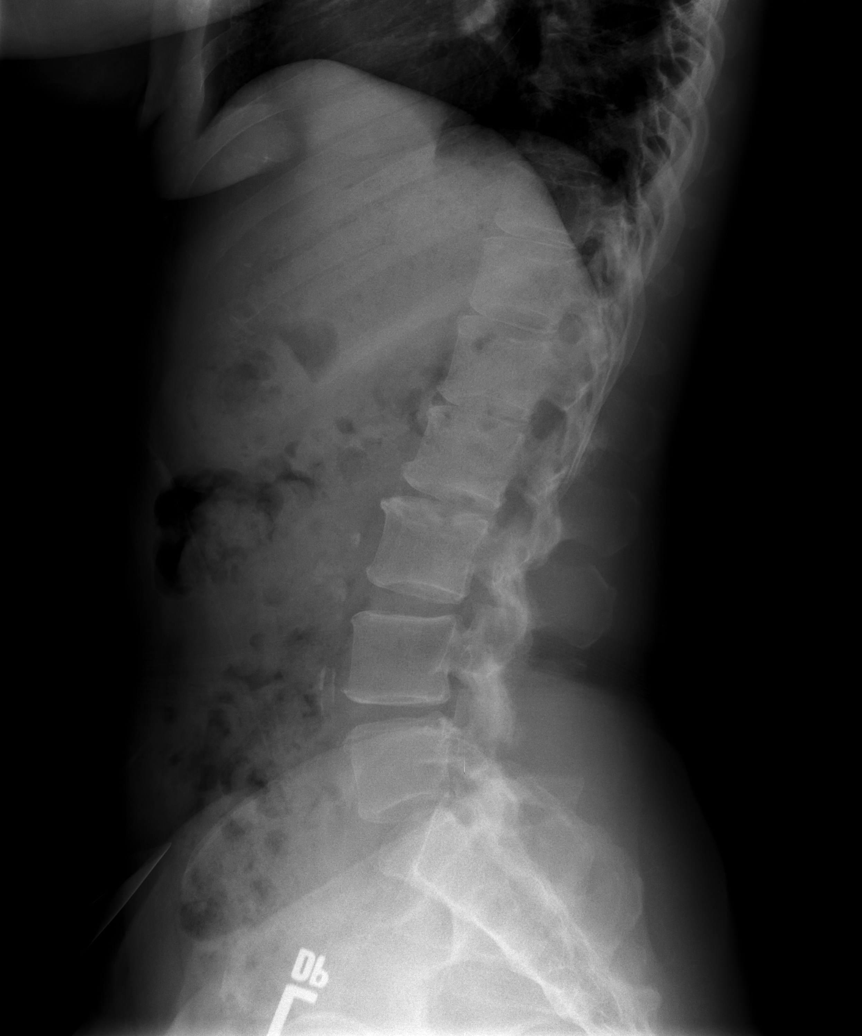

[t l-spine l5-s1 spot]
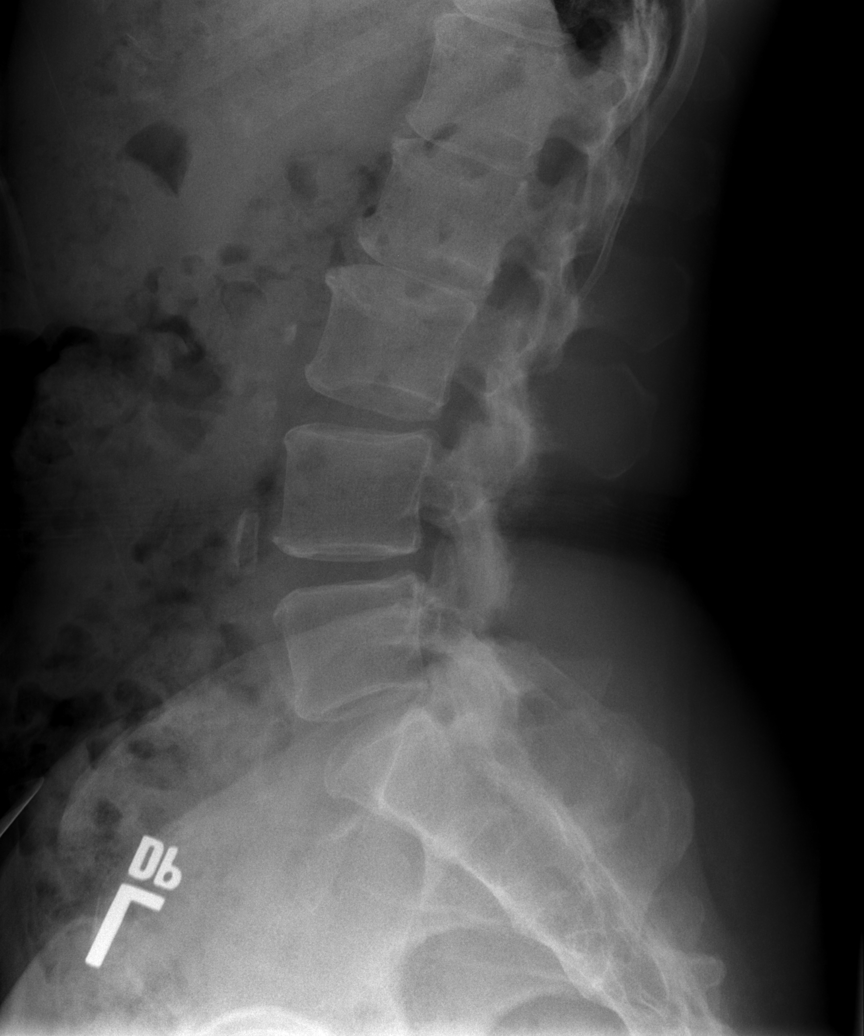

[5 of 5 positions shown; findings below may reference images not displayed]

FINDINGS: There has been subtle change in the appearance of the superior
endplate of L3, raising the question of superior endplate fracture
superimposed on degenerative disc disease. No retropulsion.

There is normal alignment of the lumbar spine. Disc height loss
identified at L2-3 and associated with uncovertebral spurring. There
is mild facet hypertrophy in the LOWER levels. Laminectomy at L4-5.
No suspicious lytic or blastic lesions are identified.
IMPRESSION: Question of superior endplate fracture of L3.  Normal alignment.

## 2018-09-30 ENCOUNTER — Ambulatory Visit: Payer: 59 | Admitting: Physical Therapy

## 2018-09-30 ENCOUNTER — Encounter: Payer: Self-pay | Admitting: Physical Therapy

## 2018-09-30 DIAGNOSIS — Z9181 History of falling: Secondary | ICD-10-CM | POA: Diagnosis not present

## 2018-09-30 DIAGNOSIS — M5442 Lumbago with sciatica, left side: Principal | ICD-10-CM

## 2018-09-30 DIAGNOSIS — R2681 Unsteadiness on feet: Secondary | ICD-10-CM | POA: Diagnosis not present

## 2018-09-30 DIAGNOSIS — M6281 Muscle weakness (generalized): Secondary | ICD-10-CM | POA: Diagnosis not present

## 2018-09-30 DIAGNOSIS — R29818 Other symptoms and signs involving the nervous system: Secondary | ICD-10-CM | POA: Diagnosis not present

## 2018-09-30 DIAGNOSIS — M5416 Radiculopathy, lumbar region: Secondary | ICD-10-CM | POA: Diagnosis not present

## 2018-09-30 DIAGNOSIS — R262 Difficulty in walking, not elsewhere classified: Secondary | ICD-10-CM | POA: Diagnosis not present

## 2018-09-30 DIAGNOSIS — G8929 Other chronic pain: Secondary | ICD-10-CM

## 2018-09-30 NOTE — Therapy (Signed)
[place], [name], [address] Phone: [phone]   Fax:  [phone]  Physical Therapy Treatment  Patient Details  Name: [name] MRN: [id number] Date of Birth: 09/02/1961 Referring Provider (PT): [name], MD   Encounter Date: 09/30/2018  PT End of Session - 09/30/18 1206    Visit Number  8    Number of Visits  12    Date for PT Re-Evaluation  10/05/18    Authorization Type  [place] [employer]    PT Start Time  1100    PT Stop Time  1200    PT Time Calculation (min)  60 min       Past Medical History:  Diagnosis Date  . Anxiety   . Back pain    related to spinal stenosis and disc problem, radiates down left buttocks to leg., weakness occ.  [name] Dyspnea   . Headache   . Hyperlipidemia   . Hypertension   . PONV (postoperative nausea and vomiting)   . Vaginal foreign object    "Uses Femring"    Past Surgical History:  Procedure Laterality Date  . ABDOMINAL HYSTERECTOMY    . CARDIAC CATHETERIZATION N/A 04/18/2015   Procedure: Left Heart Cath and Coronary Angiography;  Surgeon: [name], MD;  Location: [facility];  Service: Cardiovascular;  Laterality: N/A;  . FOOT SURGERY Bilateral    [place] "bunion,bone spur, tendon" (1) -6'16, (1)-10'16  . IR EPIDUROGRAPHY  07/21/2018  . LUMBAR LAMINECTOMY/DECOMPRESSION MICRODISCECTOMY Bilateral 12/28/2015   Procedure: MICRO LUMBAR DECOMPRESSION L4 - L5 BILATERALLY;  Surgeon: [name], MD;  [facility];  Service: Orthopedics;  Laterality: Bilateral;  . LUMBAR LAMINECTOMY/DECOMPRESSION MICRODISCECTOMY Bilateral 03/04/2018   Procedure: Revision of Microlumbar Decompression Bilateral Lumbar Four-Five;  Surgeon: [name], MD;  Location: [facility];  Service: Orthopedics;  Laterality: Bilateral;  90 mins  . WISDOM TOOTH EXTRACTION    . WOUND EXPLORATION N/A 03/04/2018   Procedure: EXPLORATION OF LUMBAR DECOMPRESSION WOUND;   Surgeon: [name], MD;  Location: [facility];  Service: Orthopedics;  Laterality: N/A;    There were no vitals filed for this visit.  Subjective Assessment - 09/30/18 1104    Subjective  Sees MD tomorrow. Had a dizzy spell in a store almost fell backward. Was able to sit for 25 minutes in store and dizziness resolved.     Currently in Pain?  Yes    Pain Score  4     Pain Location  Back    Pain Orientation  Mid;Lower    Pain Descriptors / Indicators  Burning    Pain Radiating Towards  LLE     Aggravating Factors   activity    Pain Relieving Factors  IFC, HMP          [facility] PT Assessment - 09/30/18 0001      Observation/Other Assessments   Focus on Therapeutic Outcomes (FOTO)   47% limited       PROM   Right Hip External Rotation   --   R hip flexion 108   Left Hip Flexion  115                   [facility] Adult PT Treatment/Exercise - 09/30/18 0001      Lumbar Exercises: Stretches   Single Knee to Chest Stretch  3 reps;20 seconds    Lower Trunk Rotation Limitations  x10      Lumbar Exercises: Supine  Ab Set  5 reps    Clam  20 reps    Other Supine Lumbar Exercises  Ball squeezes 2x10 with cues for core engagement       Moist Heat Therapy   Number Minutes Moist Heat  15 Minutes    Moist Heat Location  Lumbar Spine      Electrical Stimulation   Electrical Stimulation Location  bilateral paraspinals    Electrical Stimulation Action  IFC x 15 min    Electrical Stimulation Goals  Pain          Balance Exercises - 09/30/18 1220      Balance Exercises: Standing   Standing Eyes Opened  Narrow base of support (BOS);Wide (BOA);Head turns;Solid surface   2 finger support, CGA   Standing Eyes Closed  Narrow base of support (BOS);Wide (BOA)   2 finger support CGA         PT Short Term Goals - 09/30/18 1106      PT SHORT TERM GOAL #1   Title  She will increase L hip flexion by 10 deg    Baseline  improved bilateral    Time  3    Period  Weeks     Status  Achieved      PT SHORT TERM GOAL #2   Title  Pt will able to sit >30 minutes without increase in LBP    Baseline  not consistent , sometimes less or more     Time  3    Period  Weeks    Status  Partially Met      PT SHORT TERM GOAL #3   Title  Increase L hip abduction strength to 4-/5    Time  3    Period  Weeks    Status  Unable to assess      PT SHORT TERM GOAL #4   Title  She will demo correctl flexion of RT leg with walking with SPC without LOB    Baseline  using RW     Time  4    Period  Weeks    Status  On-going        PT Long Term Goals - 09/30/18 1108      PT LONG TERM GOAL #1   Title  Pt will improve bil LE hip & knee strength to 4/5 in order to allow for normalization of gait.     Time  6    Period  Weeks    Status  On-going      PT LONG TERM GOAL #2   Title  Pt will be able to stand >30 minutes without increase in pain in order to complete ADLs    Baseline  up  30 minutes or more but has to lean on RW after 15 minutes     Status  Partially Met      PT LONG TERM GOAL #3   Title  Pt will improve Bil LE hip flexion by 20 deg in order to don and doff socks and shoes     Baseline  ROM improved however she uses reacher    Time  6    Period  Weeks    Status  On-going      PT LONG TERM GOAL #4   Title  --    Baseline  --    Time  --    Period  --    Status  --  Plan - 09/30/18 1208    Clinical Impression Statement  Pt sees MD tomorrow for F/U. She notes more frequent LLE pain and is having difficulty sleeping. Her hip flexion ROM has improved. Her strength LE is unchanged. In standing she can maintain static balance with normal stance for 5-10 seconds prior to LOB backward requiring UE assist to recover. She is indpendent with her core exercises and may opt to continue them on her own after seeing MD.     PT Next Visit Plan  D/C vs ERO after MD appt    PT Home Exercise Plan  seated ab sets and ball squeezes; leg lengthener and leg  presses , standing static balance without UE    Consulted and Agree with Plan of Care  Patient       Patient will benefit from skilled therapeutic intervention in order to improve the following deficits and impairments:  Pain, Postural dysfunction, Difficulty walking, Decreased strength, Decreased activity tolerance, Abnormal gait, Increased muscle spasms, Decreased endurance, Decreased range of motion, Decreased balance  Visit Diagnosis: Chronic left-sided low back pain with left-sided sciatica  Radiculopathy, lumbar region  Muscle weakness (generalized)  Difficulty in walking, not elsewhere classified     Problem List Patient Active Problem List   Diagnosis Date Noted  . Lumbar spinal stenosis   . Radicular pain   . Gait disorder   . Difficulty in urination 07/13/2018  . Back pain 07/12/2018  . Hyperlipidemia 04/16/2018  . Generalized anxiety disorder with panic attacks 04/16/2018  . AKI (acute kidney injury) ([place])   . Benign essential HTN   . Depression with anxiety   . Lumbar radiculopathy 03/09/2018  . Paraparesis ([place]) 03/09/2018  . Essential hypertension   . Chronic nonintractable headache   . Leukocytosis   . Acute blood loss anemia   . Postoperative pain   . Generalized weakness   . Spinal stenosis at L4-L5 level 03/04/2018  . Spinal stenosis of lumbar region 12/28/2015  . Chest pain 04/16/2015    Dorene Ar, PTA 09/30/2018, 12:57 PM  [place], [name], [address] Phone: [phone]   Fax:  [phone]  Name: ABAIGEAL MOOMAW MRN: [id number] Date of Birth: 23-Feb-1961

## 2018-10-01 DIAGNOSIS — M5136 Other intervertebral disc degeneration, lumbar region: Secondary | ICD-10-CM | POA: Diagnosis not present

## 2018-10-01 DIAGNOSIS — M5416 Radiculopathy, lumbar region: Secondary | ICD-10-CM | POA: Diagnosis not present

## 2018-10-04 ENCOUNTER — Ambulatory Visit: Payer: 59 | Admitting: Physical Medicine & Rehabilitation

## 2018-10-04 ENCOUNTER — Encounter: Payer: 59 | Attending: Physical Medicine & Rehabilitation | Admitting: Physical Medicine & Rehabilitation

## 2018-10-04 ENCOUNTER — Encounter: Payer: Self-pay | Admitting: Physical Medicine & Rehabilitation

## 2018-10-04 VITALS — BP 114/78 | HR 92 | Ht 65.0 in | Wt 158.0 lb

## 2018-10-04 DIAGNOSIS — Z809 Family history of malignant neoplasm, unspecified: Secondary | ICD-10-CM | POA: Insufficient documentation

## 2018-10-04 DIAGNOSIS — Z803 Family history of malignant neoplasm of breast: Secondary | ICD-10-CM | POA: Insufficient documentation

## 2018-10-04 DIAGNOSIS — M62838 Other muscle spasm: Secondary | ICD-10-CM | POA: Insufficient documentation

## 2018-10-04 DIAGNOSIS — Z807 Family history of other malignant neoplasms of lymphoid, hematopoietic and related tissues: Secondary | ICD-10-CM | POA: Insufficient documentation

## 2018-10-04 DIAGNOSIS — Z5181 Encounter for therapeutic drug level monitoring: Secondary | ICD-10-CM

## 2018-10-04 DIAGNOSIS — Z09 Encounter for follow-up examination after completed treatment for conditions other than malignant neoplasm: Secondary | ICD-10-CM | POA: Insufficient documentation

## 2018-10-04 DIAGNOSIS — I10 Essential (primary) hypertension: Secondary | ICD-10-CM | POA: Diagnosis not present

## 2018-10-04 DIAGNOSIS — Z808 Family history of malignant neoplasm of other organs or systems: Secondary | ICD-10-CM | POA: Insufficient documentation

## 2018-10-04 DIAGNOSIS — Z800 Family history of malignant neoplasm of digestive organs: Secondary | ICD-10-CM | POA: Insufficient documentation

## 2018-10-04 DIAGNOSIS — G894 Chronic pain syndrome: Secondary | ICD-10-CM

## 2018-10-04 DIAGNOSIS — Z801 Family history of malignant neoplasm of trachea, bronchus and lung: Secondary | ICD-10-CM | POA: Insufficient documentation

## 2018-10-04 DIAGNOSIS — Z9889 Other specified postprocedural states: Secondary | ICD-10-CM | POA: Insufficient documentation

## 2018-10-04 DIAGNOSIS — Z90710 Acquired absence of both cervix and uterus: Secondary | ICD-10-CM | POA: Diagnosis not present

## 2018-10-04 DIAGNOSIS — M5416 Radiculopathy, lumbar region: Secondary | ICD-10-CM | POA: Diagnosis not present

## 2018-10-04 DIAGNOSIS — Z79891 Long term (current) use of opiate analgesic: Secondary | ICD-10-CM | POA: Diagnosis not present

## 2018-10-04 DIAGNOSIS — F419 Anxiety disorder, unspecified: Secondary | ICD-10-CM | POA: Diagnosis not present

## 2018-10-04 DIAGNOSIS — G8220 Paraplegia, unspecified: Secondary | ICD-10-CM | POA: Diagnosis not present

## 2018-10-04 MED ORDER — BACLOFEN 10 MG PO TABS: 10.0000 mg | ORAL_TABLET | Freq: Three times a day (TID) | ORAL | 3 refills | Status: DC

## 2018-10-04 MED FILL — BACLOFEN 10 MG TABLET: 10 | 30 days supply | Qty: 90 | Fill #0

## 2018-10-04 NOTE — Patient Instructions (Addendum)
PLEASE FEEL FREE TO CALL OUR OFFICE WITH ANY PROBLEMS OR QUESTIONS ([phone])     BACLOFEN:  10MG  TWICE A DAY FOR 4 DAYS, THEN THREE X DAILY   HEAT,  STRETCH AT BEDTIME.   CONTINUE TO BUILD UP STRENGTH AND STAMINA

## 2018-10-04 NOTE — Progress Notes (Signed)
Subjective:    Patient ID: [name], female    DOB: [birth year], 57 y.o.   MRN: [id number]  HPI   [name] is here lumbar radiculopathy and associated paraparesis and pain.  Pain seems to be generally improving.  She is still having pain along the left knee and lateral thigh.  It bothers her quite a bit at night when she tries to sleep.  She has been involved in outpatient therapies at neuro rehab and has been working with her walker to improve her balance and stamina.  She is wearing an AFO in the right leg.  She tells me orthopedics has seen her and is planning another epidural injection for her back later this month.  Right now for pain she is using tramadol once or twice a day.  She is now off of the Percocet.  She uses Robaxin for spasms.  She reports that her bowel bladder function is normal.   Pain Inventory Average Pain 5 Pain Right Now 5 My pain is burning, tingling and aching  In the last 24 hours, has pain interfered with the following? General activity 4 Relation with others 4 Enjoyment of life 4 What TIME of day is your pain at its worst? morning, night  Sleep (in general) Poor  Pain is worse with: sitting, standing and some activites Pain improves with: medication Relief from Meds: 5  Mobility walk with assistance use a walker  Function not employed: date last employed .  Neuro/Psych weakness trouble walking spasms anxiety  Prior Studies Any changes since last visit?  no  Physicians involved in your care Any changes since last visit?  no   Family History  Problem Relation Age of Onset  . Heart attack Mother   . Lung cancer Father   . Cancer Father   . Pancreatic cancer Sister   . Breast cancer Sister 71  . Throat cancer Brother   . Multiple myeloma Sister   . Breast cancer Sister        diagnosed in her 21's  . Heart attack Sister   . Stomach cancer Cousin   . Colon cancer Neg Hx    Social History   Socioeconomic  History  . Marital status: Married    Spouse name: Not on file  . Number of children: Not on file  . Years of education: Not on file  . Highest education level: Not on file  Occupational History  . Not on file  Social Needs  . Financial resource strain: Not on file  . Food insecurity:    Worry: Not on file    Inability: Not on file  . Transportation needs:    Medical: Not on file    Non-medical: Not on file  Tobacco Use  . Smoking status: Never Smoker  . Smokeless tobacco: Never Used  Substance and Sexual Activity  . Alcohol use: No  . Drug use: No  . Sexual activity: Yes    Partners: Male    Birth control/protection: Post-menopausal  Lifestyle  . Physical activity:    Days per week: Not on file    Minutes per session: Not on file  . Stress: Not on file  Relationships  . Social connections:    Talks on phone: Not on file    Gets together: Not on file    Attends religious service: Not on file    Active member of club or organization: Not on file    Attends meetings of clubs or  organizations: Not on file    Relationship status: Not on file  Other Topics Concern  . Not on file  Social History Narrative  . Not on file   Past Surgical History:  Procedure Laterality Date  . ABDOMINAL HYSTERECTOMY    . CARDIAC CATHETERIZATION N/A 04/18/2015   Procedure: Left Heart Cath and Coronary Angiography;  Surgeon: [name], MD;  Location: [facility];  Service: Cardiovascular;  Laterality: N/A;  . FOOT SURGERY Bilateral    [place] "bunion,bone spur, tendon" (1) -6'16, (1)-10'16  . IR EPIDUROGRAPHY  07/21/2018  . LUMBAR LAMINECTOMY/DECOMPRESSION MICRODISCECTOMY Bilateral 12/28/2015   Procedure: MICRO LUMBAR DECOMPRESSION L4 - L5 BILATERALLY;  Surgeon: [name], MD;  [facility];  Service: Orthopedics;  Laterality: Bilateral;  . LUMBAR LAMINECTOMY/DECOMPRESSION MICRODISCECTOMY Bilateral 03/04/2018   Procedure: Revision of Microlumbar Decompression  Bilateral Lumbar Four-Five;  Surgeon: [name], MD;  Location: [facility];  Service: Orthopedics;  Laterality: Bilateral;  90 mins  . WISDOM TOOTH EXTRACTION    . WOUND EXPLORATION N/A 03/04/2018   Procedure: EXPLORATION OF LUMBAR DECOMPRESSION WOUND;  Surgeon: [name], MD;  Location: [facility];  Service: Orthopedics;  Laterality: N/A;   Past Medical History:  Diagnosis Date  . Anxiety   . Back pain    related to spinal stenosis and disc problem, radiates down left buttocks to leg., weakness occ.  [name] Dyspnea   . Headache   . Hyperlipidemia   . Hypertension   . PONV (postoperative nausea and vomiting)   . Vaginal foreign object    "Uses Femring"   BP 114/78   Pulse 92   Ht '5\' 5"'$  (1.651 m)   Wt 158 lb (71.7 kg)   SpO2 97%   BMI 26.29 kg/m   Opioid Risk Score:   Fall Risk Score:  `1  Depression screen PHQ 2/9  Depression screen [facility] 2/9 08/17/2018 07/05/2018 05/03/2018 04/01/2018  Decreased Interest 0 0 0 1  Down, Depressed, Hopeless 0 0 0 1  PHQ - 2 Score 0 0 0 2  Altered sleeping - - - 1  Tired, decreased energy - - - 1  Change in appetite - - - 1  Feeling bad or failure about yourself  - - - 0  Trouble concentrating - - - 0  Moving slowly or fidgety/restless - - - 0  Suicidal thoughts - - - 0  PHQ-9 Score - - - 5  Difficult doing work/chores - - - Somewhat difficult    Review of Systems  Constitutional: Negative.   HENT: Negative.   Eyes: Negative.   Respiratory: Negative.   Cardiovascular: Negative.   Gastrointestinal: Negative.   Endocrine: Negative.   Genitourinary: Negative.   Musculoskeletal: Positive for arthralgias, back pain and gait problem.       Spasms   Skin: Negative.   Allergic/Immunologic: Negative.   Neurological: Positive for weakness.  Psychiatric/Behavioral: The patient is nervous/anxious.   All other systems reviewed and are negative.      Objective:   Physical Exam General: No acute distress HEENT: EOMI, oral membranes moist Cards:  reg rate  Chest: normal effort Abdomen: Soft, NT, ND Skin: dry, intact Extremities: no edema  Neuro: Pt is cognitively appropriate with normal insight, memory, and awareness. Cranial nerves 2-12 are intact.  Reflexes are 2+ in all 4's. Fine motor coordination is intact. No tremors. Motor function is grossly 5/5 in the upper extremities.  Lower extremities she has has cogwheeling weakness bilaterally distal more than  proximal, she does have antigravity strength in quads and hamstrings. Right LE: minimal ankle dorsiflexion and plantarflexion today.  Left side reveals  2-3 out of 5 ankle dorsiflexion and plantarflexion.  Sensation decreased in distal lower extremities.. Ambulated with walker and has to work to maintain knee control, extension. Occasional hyperextension.  Musculoskeletal: low back TTP. No palpable spasm in left quad.  Psych: Pt's affect is appropriate. Pt is cooperative         Assessment & Plan:  Medical Problem List and Plan: 1.Decreased functional mobilitysecondary to acute onchronic lumbar radiculopathy and foraminal stenosis bilaterally with decompression May 2019. Recent fall with exacerbation of pain and motor or sensory deficits.  -continue with outpt therapies to improve gait and balance, specifically left knee control and strength.  Might be a good candidate for aquatic-based therapies.  2.   Pain Management:Neurontin: maintain '600mg'$  TID -I suspect a lot of her left leg/knee pain is related to muscle cramping and spasm from her radiculopathy.  She also hyperextends the knee at times during stance which does not help matters.  Will replace Robaxin 500 with baclofen '10mg'$  TID            -continue Ultram 50 mg every 12 hours. #60, NO RF  -We will continue the controlled substance monitoring program, this consists of regular clinic visits, examinations, routine drug screening, pill counts as well as use of [place] Controlled  Substance Reporting System. [place] today.    -UDS today            -voltaren gel for left knee            -Maintain outpt therapies to build up muscle strength and stamina  -scheduled for f/u repeat ESI   4. Mood:  -continued ego support provided 5.  .Constipation:.  -Resolved     15 minutes of face to face patient care time were spent during this visit. All questions were encouraged and answered.

## 2018-10-07 DIAGNOSIS — M79605 Pain in left leg: Secondary | ICD-10-CM | POA: Diagnosis not present

## 2018-10-09 LAB — TOXASSURE SELECT,+ANTIDEPR,UR

## 2018-10-11 ENCOUNTER — Telehealth: Payer: Self-pay | Admitting: *Deleted

## 2018-10-11 NOTE — Telephone Encounter (Signed)
[place] drug screen for this encounter is consistent for prescribed medication 

## 2018-10-12 MED FILL — HYDROCODON-APAP 5-325: 5-325 | 5 days supply | Qty: 15 | Fill #0

## 2018-10-15 ENCOUNTER — Ambulatory Visit: Payer: 59 | Admitting: Physical Therapy

## 2018-10-15 ENCOUNTER — Encounter: Payer: Self-pay | Admitting: Physical Therapy

## 2018-10-15 DIAGNOSIS — R29818 Other symptoms and signs involving the nervous system: Secondary | ICD-10-CM | POA: Diagnosis not present

## 2018-10-15 DIAGNOSIS — R2681 Unsteadiness on feet: Secondary | ICD-10-CM

## 2018-10-15 DIAGNOSIS — M6281 Muscle weakness (generalized): Secondary | ICD-10-CM | POA: Diagnosis not present

## 2018-10-15 DIAGNOSIS — M5416 Radiculopathy, lumbar region: Secondary | ICD-10-CM | POA: Diagnosis not present

## 2018-10-15 DIAGNOSIS — R262 Difficulty in walking, not elsewhere classified: Secondary | ICD-10-CM

## 2018-10-15 DIAGNOSIS — Z9181 History of falling: Secondary | ICD-10-CM

## 2018-10-15 DIAGNOSIS — G8929 Other chronic pain: Secondary | ICD-10-CM | POA: Diagnosis not present

## 2018-10-15 DIAGNOSIS — M5442 Lumbago with sciatica, left side: Secondary | ICD-10-CM | POA: Diagnosis not present

## 2018-10-16 ENCOUNTER — Other Ambulatory Visit: Payer: Self-pay

## 2018-10-16 NOTE — Therapy (Signed)
[facility] [address] [place], [name], [address] Phone: [phone]   Fax:  [phone]  Physical Therapy Treatment  Patient Details  Name: [name] MRN: [id number] Date of Birth: 1961-09-23 Referring Provider (PT): Meredith Staggers, MD   Encounter Date: 10/15/2018  PT End of Session - 10/16/18 1107    Visit Number  9    Number of Visits  16    Date for PT Re-Evaluation  11/14/18    Authorization Type   [employer]; ?worker's comp    PT Start Time  5009   pt late arrival due to got lost   PT Stop Time  1530    PT Time Calculation (min)  32 min    Activity Tolerance  Patient tolerated treatment well    Behavior During Therapy  [facility] for tasks assessed/performed       Past Medical History:  Diagnosis Date  . Anxiety   . Back pain    related to spinal stenosis and disc problem, radiates down left buttocks to leg., weakness occ.  [name] Dyspnea   . Headache   . Hyperlipidemia   . Hypertension   . PONV (postoperative nausea and vomiting)   . Vaginal foreign object    "Uses Femring"    Past Surgical History:  Procedure Laterality Date  . ABDOMINAL HYSTERECTOMY    . CARDIAC CATHETERIZATION N/A 04/18/2015   Procedure: Left Heart Cath and Coronary Angiography;  Surgeon: [name], MD;  Location: [place] [facility];  Service: Cardiovascular;  Laterality: N/A;  . FOOT SURGERY Bilateral    [place] "bunion,bone spur, tendon" (1) -6'16, (1)-10'16  . IR EPIDUROGRAPHY  07/21/2018  . LUMBAR LAMINECTOMY/DECOMPRESSION MICRODISCECTOMY Bilateral 12/28/2015   Procedure: MICRO LUMBAR DECOMPRESSION L4 - L5 BILATERALLY;  Surgeon: [name], MD;  Location: [facility];  Service: Orthopedics;  Laterality: Bilateral;  . LUMBAR LAMINECTOMY/DECOMPRESSION MICRODISCECTOMY Bilateral 03/04/2018   Procedure: Revision of Microlumbar Decompression Bilateral Lumbar Four-Five;  Surgeon: [name], MD;  Location: [place];  Service: Orthopedics;  Laterality: Bilateral;  90 mins  . WISDOM TOOTH EXTRACTION    . WOUND EXPLORATION N/A 03/04/2018   Procedure: EXPLORATION OF LUMBAR DECOMPRESSION WOUND;  Surgeon: [name], MD;  Location: [place];  Service: Orthopedics;  Laterality: N/A;    There were no vitals filed for this visit.  Subjective Assessment - 10/15/18 1502    Subjective  Saw Dr. Naaman Plummer and he referred her to Neuro OPPT for possible aquatic therapy. States she feels ortho PT [employer]) has helped her as much as they can (she was not discharged from PT episode at [facility], but has now chosen not to return there). Can't sleep due to the pain in her legs and then pain wakes her up. Twice she has sat up and legs won't work. Reports she has discussed this with both [name] and Dr. Naaman Plummer.     Pertinent History  decompression and laminectomy on 12/28/15 at L4-L5  with redo decompression surgery on May 9,2019, post-op paraparesis with surgery to remove small hematoma; 07/12/18 fall at work (involved rolling chair) with hospital admission, transfer to [place] with d/c home 08/03/18; HTN, Bil foot surgery     Limitations  Standing;Walking;Sitting;Other (comment)    How long can you stand comfortably?  can't stand for long     How long can you walk comfortably?  short community distances     Patient Stated Goals  decrease pain in legs  and improve LE strength and walking    Currently in Pain?  Yes    Pain Score  4     Pain Location  Back    Pain Orientation  Lower    Pain Descriptors / Indicators  Aching;Burning    Pain Type  Chronic pain    Pain Radiating Towards  LLE    Pain Onset  More than a month ago    Pain Frequency  Constant    Aggravating Factors   activity    Pain Relieving Factors  lie supine with knees bent    Effect of Pain on Daily Activities  dereased mobility; impaired sleep         [facility] PT Assessment - 10/15/18 1530      Assessment   Medical Diagnosis  lumbar radiculopathy, paraparesis     Referring Provider (PT)  Meredith Staggers, MD    Onset Date/Surgical Date  --   surgery 03/04/18; fall 07/12/18   Prior Therapy  CIR, OP at [facility] st      Precautions   Precautions  Fall;Back    Required Braces or Orthoses  Other Brace/Splint    Other Brace/Splint  AFO to RLE      Balance Screen   Has the patient fallen in the past 6 months  Yes    How many times?  at least 2    Has the patient had a decrease in activity level because of a fear of falling?   Yes    Is the patient reluctant to leave their home because of a fear of falling?   No      Home Social worker  Private residence    Living Arrangements  Spouse/significant other;Children    Type of [place] to enter    Entrance Stairs-Number of Steps  4    Entrance Stairs-Rails  Right    Home Layout  Two level    Alternate Level Stairs-Number of Steps  8    Alternate Level Stairs-Rails  Right    Angels - 2 wheels;Wheelchair - [employer];Shower seat;Cane - single point      Prior Function   Level of Independence  Independent with community mobility with device    Vocation  Workers comp    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    Leisure  walking 30 mins/day 4 days/ week      Cognition   Overall Cognitive Status  Within Functional Limits for tasks assessed      Sensation   Light Touch  Impaired by gross assessment   reports decr rt foot     Coordination   Gross Motor Movements are Fluid and Coordinated  No    Fine Motor Movements are Fluid and Coordinated  No    Coordination and Movement Description  spastic appearing movements bil LEs (rt worse than lt)      Tone   Assessment Location  Right Lower Extremity;Left Lower Extremity      Strength   Right Hip Flexion  3/5    Right Hip ABduction  3+/5    Left Hip Flexion  3+/5    Right Knee Extension  3/5    Left Knee Extension  3+/5      Transfers   Transfers   Sit to Stand;Stand to Sit    Sit to Stand  6: Modified independent (Device/Increase time)  Ambulation/Gait   Ambulation/Gait  Yes    Ambulation/Gait Assistance  6: Modified independent (Device/Increase time)    Ambulation Distance (Feet)  40 Feet   25, 40   Assistive device  Rolling walker   rt AFO   Gait Pattern  Step-through pattern;Decreased step length - right;Decreased step length - left;Decreased dorsiflexion - right;Decreased weight shift to right;Right steppage;Right foot flat   incr reliance on bil UEs   Ambulation Surface  Level;Indoor    Gait velocity  11/16.09=0.68 ft/sec    11 ft used due to limited time and space in clinic     RLE Tone   RLE Tone  Modified Ashworth      RLE Tone   Modified Ashworth Scale for Grading Hypertonia RLE  Considerable increase in muschle tone, passive movement difficult      LLE Tone   LLE Tone  Modified Ashworth      LLE Tone   Modified Ashworth Scale for Grading Hypertonia LLE  Slight increase in muscle tone, manifested by a catch, followed by minimal resistance throughout the remainder (less than half) of the ROM                           PT Education - 10/16/18 1103    Education Details  continue HEP established at ortho OPPT; benefits of aquatic therapy; current waitlist for aquatic therapy in [place] of earlier start in [place]; will call with plan and schedule appts once in touch with [facility]; if significant delay for aquatics, could offer 1x/week unweighted treadmill training at OP Neurorehab    Person(s) Educated  Patient    Methods  Explanation    Comprehension  Verbalized understanding          PT Long Term Goals - 10/16/18 1123      PT LONG TERM GOAL #1   Title  Pt will be independent with updated HEP, including plan for community-based exercise program--potentially aquatic ([facility] for LTGs 11/14/18)    Time  4    Period  Weeks    Status  New      PT LONG TERM GOAL #2   Title   Patient will improve gait velocity to 1.0 ft/sec with Rt AFO and RW, demonstrating lesser fall risk    Baseline  0.68 ft/sec    Time  4    Period  Weeks    Status  New      PT LONG TERM GOAL #3   Title  Patient will verbalize understanding of fall prevention recommendations/education     Time  4    Period  Weeks    Status  New            Plan - 10/16/18 1109    Clinical Impression Statement  Patient was never discharged from current episode of OPPT and was last seen at [facility] office 09/30/18. Patient now referred to OP Neurorehab by Dr. Naaman Plummer for potential aquatic therapy. Re-evaluation completed this date. Patient is a good candidate for aquatic therapy and is interested in pursuing despite stating she is "not a swimmer." Attempted to reach [facility] rehab re: potential earlier pool therapy appt as [facility] currently is scheduling into February. Left message at [facility] for return call. Discussed option of unweighting treadmill training (not available at [facility] office) if >4 weeks to get into pool therapy and pt is interested. Patient can benefit from continued PT to address the deficits listed below via  the interventions listed below.     History and Personal Factors relevant to plan of care:  decompression surgery on May 9,2019, post-op paraparesis with surgery to remove small hematoma, 07/12/18 fall at work (rolling chair), hospital to [place] with d/c home 08/03/18, HTN, Bil foot surgery     Clinical Presentation  Evolving    Clinical Presentation due to:  continued high fall risk; LE spasticity and spasms are progressing    Clinical Decision Making  Moderate    Rehab Potential  Fair    Clinical Impairments Affecting Rehab Potential  decr safety awareness with pattern of falls due to poor judgement (walking without AFO, walking without RW, sitting on rolling chair)    PT Frequency  1x / week    PT Duration  4 weeks    PT Treatment/Interventions  Manual techniques;Patient/family  education;Therapeutic exercise;Therapeutic activities;Gait training;ADLs/Self Care Home Management;Neuromuscular re-education;Functional mobility training;Passive range of motion;Balance training;Aquatic Therapy;Biofeedback;DME Instruction;Stair training;Orthotic Fit/Training;Dry needling    PT Next Visit Plan  refer to pool therapy ([place] vs [place]--actually closer to where pt lives); if delayed, unweighting treadmill training for endurance, LE strengthening, gait training; review HEP from [employer] and Agree with Plan of Care  Patient       Patient will benefit from skilled therapeutic intervention in order to improve the following deficits and impairments:  Pain, Postural dysfunction, Difficulty walking, Decreased strength, Decreased activity tolerance, Abnormal gait, Increased muscle spasms, Decreased endurance, Decreased balance, Decreased coordination, Decreased knowledge of use of DME, Decreased mobility, Decreased safety awareness, Impaired sensation, Impaired tone  Visit Diagnosis: Muscle weakness (generalized) - Plan: PT plan of care cert/re-cert  Difficulty in walking, not elsewhere classified - Plan: PT plan of care cert/re-cert  Lumbar radiculopathy - Plan: PT plan of care cert/re-cert  History of falling - Plan: PT plan of care cert/re-cert  Other symptoms and signs involving the nervous system - Plan: PT plan of care cert/re-cert  Unsteadiness on feet - Plan: PT plan of care cert/re-cert     Problem List Patient Active Problem List   Diagnosis Date Noted  . Lumbar spinal stenosis   . Radicular pain   . Gait disorder   . Difficulty in urination 07/13/2018  . Back pain 07/12/2018  . Hyperlipidemia 04/16/2018  . Generalized anxiety disorder with panic attacks 04/16/2018  . AKI (acute kidney injury) ([place])   . Benign essential HTN   . Depression with anxiety   . Lumbar radiculopathy 03/09/2018  . Paraparesis ([place]) 03/09/2018  . Essential  hypertension   . Chronic nonintractable headache   . Leukocytosis   . Acute blood loss anemia   . Postoperative pain   . Generalized weakness   . Spinal stenosis at L4-L5 level 03/04/2018  . Spinal stenosis of lumbar region 12/28/2015  . Chest pain 04/16/2015    Rexanne Mano, PT 10/16/2018, 11:36 AM  [facility] [address] [place], [name], [address] Phone: [phone]   Fax:  [phone]  Name: DARLA MCDONALD MRN: [id number] Date of Birth: 1961/01/09

## 2018-10-18 MED FILL — ALPRAZolam 0.25 MG TABS: 0.25 | 30 days supply | Qty: 30 | Fill #1

## 2018-10-18 MED FILL — DICLOFENAC SODIUM 1 % GEL: 1 | 16 days supply | Qty: 100 | Fill #1

## 2018-10-26 DIAGNOSIS — M5416 Radiculopathy, lumbar region: Secondary | ICD-10-CM | POA: Diagnosis not present

## 2018-10-28 ENCOUNTER — Ambulatory Visit: Payer: 59 | Attending: Specialist | Admitting: Physical Therapy

## 2018-10-28 ENCOUNTER — Encounter: Payer: Self-pay | Admitting: Physical Therapy

## 2018-10-28 DIAGNOSIS — R2681 Unsteadiness on feet: Secondary | ICD-10-CM | POA: Diagnosis not present

## 2018-10-28 DIAGNOSIS — M5416 Radiculopathy, lumbar region: Secondary | ICD-10-CM | POA: Insufficient documentation

## 2018-10-28 DIAGNOSIS — M6281 Muscle weakness (generalized): Secondary | ICD-10-CM | POA: Insufficient documentation

## 2018-10-28 DIAGNOSIS — M5442 Lumbago with sciatica, left side: Secondary | ICD-10-CM | POA: Diagnosis not present

## 2018-10-28 DIAGNOSIS — G8929 Other chronic pain: Secondary | ICD-10-CM | POA: Insufficient documentation

## 2018-10-28 DIAGNOSIS — R2689 Other abnormalities of gait and mobility: Secondary | ICD-10-CM | POA: Diagnosis not present

## 2018-10-28 DIAGNOSIS — R29818 Other symptoms and signs involving the nervous system: Secondary | ICD-10-CM | POA: Diagnosis not present

## 2018-10-28 DIAGNOSIS — R262 Difficulty in walking, not elsewhere classified: Secondary | ICD-10-CM | POA: Insufficient documentation

## 2018-10-28 NOTE — Therapy (Signed)
[facility] [address] [place], [name], [address] Phone: [phone]   Fax:  [phone]  Physical Therapy Treatment  Patient Details  Name: [name] MRN: [id number] Date of Birth: 01-06-1961 Referring Provider (PT): Meredith Staggers, MD   Encounter Date: 10/28/2018  PT End of Session - 10/28/18 0854    Visit Number  10    Number of Visits  16    Date for PT Re-Evaluation  11/14/18    Authorization Type  [facility] [place] [employer]; ?worker's comp    PT Start Time  [phone]    PT Stop Time  0940    PT Time Calculation (min)  54 min    Activity Tolerance  Patient limited by pain   incr on 3rd time walking on treadmill; decr by end of session to 4/10   Behavior During Therapy  [facility] for tasks assessed/performed       Past Medical History:  Diagnosis Date  . Anxiety   . Back pain    related to spinal stenosis and disc problem, radiates down left buttocks to leg., weakness occ.  [name] Dyspnea   . Headache   . Hyperlipidemia   . Hypertension   . PONV (postoperative nausea and vomiting)   . Vaginal foreign object    "Uses Femring"    Past Surgical History:  Procedure Laterality Date  . ABDOMINAL HYSTERECTOMY    . CARDIAC CATHETERIZATION N/A 04/18/2015   Procedure: Left Heart Cath and Coronary Angiography;  Surgeon: [name], MD;  Location: [place] [facility];  Service: Cardiovascular;  Laterality: N/A;  . FOOT SURGERY Bilateral    [place] "bunion,bone spur, tendon" (1) -6'16, (1)-10'16  . IR EPIDUROGRAPHY  07/21/2018  . LUMBAR LAMINECTOMY/DECOMPRESSION MICRODISCECTOMY Bilateral 12/28/2015   Procedure: MICRO LUMBAR DECOMPRESSION L4 - L5 BILATERALLY;  Surgeon: [name], MD;  [facility];  Service: Orthopedics;  Laterality: Bilateral;  . LUMBAR LAMINECTOMY/DECOMPRESSION MICRODISCECTOMY Bilateral 03/04/2018   Procedure: Revision of Microlumbar Decompression Bilateral Lumbar Four-Five;  Surgeon: [name], MD;  Location: [facility];  Service: Orthopedics;  Laterality: Bilateral;  90 mins  . WISDOM TOOTH EXTRACTION    . WOUND EXPLORATION N/A 03/04/2018   Procedure: EXPLORATION OF LUMBAR DECOMPRESSION WOUND;  Surgeon: [name], MD;  Location: [place];  Service: Orthopedics;  Laterality: N/A;    There were no vitals filed for this visit.  Subjective Assessment - 10/28/18 0852    Subjective  I had an injection in my back on 12/31 and I felt great for a few days and it is already wearing off.     Pertinent History  decompression and laminectomy on 12/28/15 at L4-L5  with redo decompression surgery on May 9,2019, post-op paraparesis with surgery to remove small hematoma; 07/12/18 fall at work (involved rolling chair) with hospital admission, transfer to [place] with d/c home 08/03/18; HTN, Bil foot surgery     Limitations  Standing;Walking;Sitting;Other (comment)    How long can you stand comfortably?  can't stand for long     How long can you walk comfortably?  short community distances     Patient Stated Goals  decrease pain in legs and improve LE strength and walking    Currently in Pain?  Yes    Pain Score  3     Pain Location  Back    Pain Orientation  Lower    Pain Descriptors / Indicators  Aching    Pain Type  Chronic pain  Pain Onset  More than a month ago    Pain Frequency  Constant    Aggravating Factors   activity    Pain Relieving Factors  ie in supine with knees bent                       [facility] Adult PT Treatment/Exercise - 10/28/18 0926      Transfers   Transfers  Sit to Stand;Stand to Sit    Sit to Stand  6: Modified independent (Device/Increase time);4: Min assist;From chair/3-in-1;With upper extremity assist   assist from chair without arms on treadmill     Ambulation/Gait   Ambulation/Gait Assistance  4: Min assist    Ambulation/Gait Assistance Details  see treadmill; overground after treadmill with minguard assist due to LE fatigue, however pt wanted to  walk to front lobby and refused PTs offer to get a w/c and allow her to ride to lobby    Ambulation Distance (Feet)  80 Feet   x2   Assistive device  Rolling walker    Gait Pattern  Step-through pattern;Decreased step length - right;Decreased step length - left;Decreased dorsiflexion - right;Decreased weight shift to right;Right steppage;Right foot flat;Decreased hip/knee flexion - right    Ambulation Surface  Level;Indoor   treadmill     Lumbar Exercises: Aerobic   Tread Mill  up to 0.3 mph (but did best at 0.2 mph) with 2 min, 2 min and 3 min (seated rests on chair seated on treadmill in between); required manuall assist to clear rt toe and incr step length; as step length and speed increased, pt reported incr back pain      Moist Heat Therapy   Number Minutes Moist Heat  15 Minutes    Moist Heat Location  Lumbar Spine   seated in chair after use of treadmill; educ re: pool therap            PT Education - 10/28/18 0954    Education Details  pt inquired re: taking a water class with her [facility] while waiting to get into the pool therapy. Educated re: logisitics of safely getting in/out of pool and [facility] ([facility]; ?zero entry and water w/c); Advised to not attempt pool class until she meets with Guido Sander, PT at her next Neuro PT appt and can discuss with Vinnie Level (who will be doing her pool PT sessions)    Person(s) Educated  Patient    Methods  Explanation    Comprehension  Verbalized understanding       PT Short Term Goals - 10/16/18 1123      PT SHORT TERM GOAL #1   Title  -        PT Long Term Goals - 10/16/18 1123      PT LONG TERM GOAL #1   Title  Pt will be independent with updated HEP, including plan for community-based exercise program--potentially aquatic ([facility] for LTGs 11/14/18)    Time  4    Period  Weeks    Status  New      PT LONG TERM GOAL #2   Title  Patient will improve gait velocity to 1.0 ft/sec  with Rt AFO and RW, demonstrating lesser fall risk    Baseline  0.68 ft/sec    Time  4    Period  Weeks    Status  New      PT LONG TERM GOAL #3  Title  Patient will verbalize understanding of fall prevention recommendations/education     Time  4    Period  Weeks    Status  New            Plan - 10/28/18 1000    Clinical Impression Statement  Patient seen in PT clinic for trial of unweighted walking on treadmill. She initially had increased tone RLE with great difficulty flexing and advancing RLE (likely partly due to anxiety re: new situation). After seated rest, second round of walking was much better, however still required assist to clear her rt toe as advancing RLE (she was able to flex at hip and knee without assist). As progressed from 0.2 to 0.3 mph and  worked on longer step length, she reported incr in back pain and further unweighted walking deferred. Answered pt's questions related to pool therapy benefits and problem-solved whether she [facility] while [facility] PT to begin. Patient agreed to not attempt pool class until she discusses with Pool therapist Vinnie Level) at her next visit in the clinic.     Rehab Potential  Fair    Clinical Impairments Affecting Rehab Potential  decr safety awareness with pattern of falls due to poor judgement (walking without AFO, walking without RW, sitting on rolling chair)    PT Frequency  1x / week    PT Duration  4 weeks    PT Treatment/Interventions  Manual techniques;Patient/family education;Therapeutic exercise;Therapeutic activities;Gait training;ADLs/Self Care Home Management;Neuromuscular re-education;Functional mobility training;Passive range of motion;Balance training;Aquatic Therapy;Biofeedback;DME Instruction;Stair training;Orthotic Fit/Training;Dry needling    PT Next Visit Plan  referred to pool therapy; review HEP from [facility] (and update/modify as approp); fall prevention  education; unweighting treadmill training for endurance, LE strengthening, gait training;     Consulted and Agree with Plan of Care  Patient       Patient will benefit from skilled therapeutic intervention in order to improve the following deficits and impairments:  Pain, Postural dysfunction, Difficulty walking, Decreased strength, Decreased activity tolerance, Abnormal gait, Increased muscle spasms, Decreased endurance, Decreased balance, Decreased coordination, Decreased knowledge of use of DME, Decreased mobility, Decreased safety awareness, Impaired sensation, Impaired tone  Visit Diagnosis: Difficulty in walking, not elsewhere classified  Lumbar radiculopathy  Muscle weakness (generalized)     Problem List Patient Active Problem List   Diagnosis Date Noted  . Lumbar spinal stenosis   . Radicular pain   . Gait disorder   . Difficulty in urination 07/13/2018  . Back pain 07/12/2018  . Hyperlipidemia 04/16/2018  . Generalized anxiety disorder with panic attacks 04/16/2018  . AKI (acute kidney injury) ([place])   . Benign essential HTN   . Depression with anxiety   . Lumbar radiculopathy 03/09/2018  . Paraparesis ([place]) 03/09/2018  . Essential hypertension   . Chronic nonintractable headache   . Leukocytosis   . Acute blood loss anemia   . Postoperative pain   . Generalized weakness   . Spinal stenosis at L4-L5 level 03/04/2018  . Spinal stenosis of lumbar region 12/28/2015  . Chest pain 04/16/2015    Rexanne Mano, PT 10/28/2018, 10:08 AM  [facility] [address] [place], [name], [address] Phone: [phone]   Fax:  [phone]  Name: [name] MRN: [id number] Date of Birth: 02-09-1961

## 2018-11-03 DIAGNOSIS — M5416 Radiculopathy, lumbar region: Secondary | ICD-10-CM | POA: Diagnosis not present

## 2018-11-03 DIAGNOSIS — I10 Essential (primary) hypertension: Secondary | ICD-10-CM | POA: Diagnosis not present

## 2018-11-03 DIAGNOSIS — M48061 Spinal stenosis, lumbar region without neurogenic claudication: Secondary | ICD-10-CM | POA: Diagnosis not present

## 2018-11-03 DIAGNOSIS — M5136 Other intervertebral disc degeneration, lumbar region: Secondary | ICD-10-CM | POA: Diagnosis not present

## 2018-11-03 DIAGNOSIS — N179 Acute kidney failure, unspecified: Secondary | ICD-10-CM | POA: Diagnosis not present

## 2018-11-03 DIAGNOSIS — F411 Generalized anxiety disorder: Secondary | ICD-10-CM | POA: Diagnosis not present

## 2018-11-05 ENCOUNTER — Encounter

## 2018-11-07 DIAGNOSIS — M79605 Pain in left leg: Secondary | ICD-10-CM | POA: Diagnosis not present

## 2018-11-08 ENCOUNTER — Ambulatory Visit: Payer: 59 | Admitting: Physical Therapy

## 2018-11-08 DIAGNOSIS — R262 Difficulty in walking, not elsewhere classified: Secondary | ICD-10-CM | POA: Diagnosis not present

## 2018-11-08 DIAGNOSIS — M5442 Lumbago with sciatica, left side: Secondary | ICD-10-CM | POA: Diagnosis not present

## 2018-11-08 DIAGNOSIS — R29818 Other symptoms and signs involving the nervous system: Secondary | ICD-10-CM | POA: Diagnosis not present

## 2018-11-08 DIAGNOSIS — R2689 Other abnormalities of gait and mobility: Secondary | ICD-10-CM | POA: Diagnosis not present

## 2018-11-08 DIAGNOSIS — R2681 Unsteadiness on feet: Secondary | ICD-10-CM

## 2018-11-08 DIAGNOSIS — M5416 Radiculopathy, lumbar region: Secondary | ICD-10-CM | POA: Diagnosis not present

## 2018-11-08 DIAGNOSIS — M6281 Muscle weakness (generalized): Secondary | ICD-10-CM

## 2018-11-08 DIAGNOSIS — G8929 Other chronic pain: Secondary | ICD-10-CM | POA: Diagnosis not present

## 2018-11-09 NOTE — Therapy (Signed)
[facility] [address] [place], [name], [address] Phone: [phone]   Fax:  [phone]  Physical Therapy Treatment  Patient Details  Name: [name] MRN: [id number] Date of Birth: [birth year] Referring Provider (PT): Meredith Staggers, MD   Encounter Date: 11/08/2018  PT End of Session - 11/09/18 1945    Visit Number  11    Number of Visits  16    Date for PT Re-Evaluation  11/14/18    Authorization Type  [employer]; ?worker's comp    PT Start Time  1504    PT Stop Time  1548    PT Time Calculation (min)  44 min    Equipment Utilized During Treatment  Other (comment)   flotation belt      Past Medical History:  Diagnosis Date  . Anxiety   . Back pain    related to spinal stenosis and disc problem, radiates down left buttocks to leg., weakness occ.  [name] Dyspnea   . Headache   . Hyperlipidemia   . Hypertension   . PONV (postoperative nausea and vomiting)   . Vaginal foreign object    "Uses Femring"    Past Surgical History:  Procedure Laterality Date  . ABDOMINAL HYSTERECTOMY    . CARDIAC CATHETERIZATION N/A 04/18/2015   Procedure: Left Heart Cath and Coronary Angiography;  Surgeon: [name], MD;  Location: [facility];  Service: Cardiovascular;  Laterality: N/A;  . FOOT SURGERY Bilateral    [place] "bunion,bone spur, tendon" (1) -6'16, (1)-10'16  . IR EPIDUROGRAPHY  07/21/2018  . LUMBAR LAMINECTOMY/DECOMPRESSION MICRODISCECTOMY Bilateral 12/28/2015   Procedure: MICRO LUMBAR DECOMPRESSION L4 - L5 BILATERALLY;  Surgeon: [name], MD;  Location: [facility];  Service: Orthopedics;  Laterality: Bilateral;  . LUMBAR LAMINECTOMY/DECOMPRESSION MICRODISCECTOMY Bilateral 03/04/2018   Procedure: Revision of Microlumbar Decompression Bilateral Lumbar Four-Five;  Surgeon: [name], MD;  Location: [facility];  Service: Orthopedics;  Laterality: Bilateral;  90 mins  . WISDOM TOOTH  EXTRACTION    . WOUND EXPLORATION N/A 03/04/2018   Procedure: EXPLORATION OF LUMBAR DECOMPRESSION WOUND;  Surgeon: [name], MD;  Location: [facility];  Service: Orthopedics;  Laterality: N/A;    There were no vitals filed for this visit.  Subjective Assessment - 11/09/18 1939    Subjective  Pt reports she is very scared of the water - had traumatic incident when she was 58 yrs old; pt presents for aquatic therapy at the [facility]; using RW for assistance with ambulation    Pertinent History  decompression and laminectomy on 12/28/15 at L4-L5  with redo decompression surgery on May 9,2019, post-op paraparesis with surgery to remove small hematoma; 07/12/18 fall at work (involved rolling chair) with hospital admission, transfer to [place] with d/c home 08/03/18; HTN, Bil foot surgery     Patient Stated Goals  decrease pain in legs and improve LE strength and walking    Currently in Pain?  Yes    Pain Score  3     Pain Location  Back    Pain Orientation  Lower    Pain Descriptors / Indicators  Aching    Pain Type  Chronic pain    Pain Onset  More than a month ago    Pain Frequency  Constant    Multiple Pain Sites  No      Aquatic therapy; 87.2 degrees   Patient seen for aquatic therapy today.  Treatment took place in water 3.5-4  feet deep depending upon activity.  Pt entered and  Exited the pool via step negotiation with use of bil. Hand rails using a step by step sequence.     floation belt used on pt for increased comfort/ease in water due to pt's fear of water  Pt performed stretches for bil. LE's - Runner's stretch and heel cord stretch - 30 sec hold each stretch on RLE and LLE   Pt performed forwards, backwards and sideways ambulation in pool approx.35' x 2 reps each direction with UE support on pool edge  And with hand held assist from PT  Pt performed hip abduction, extension and flexion 10 reps each leg each direction with UE support Heel raises bil. LE's 10 reps Marching 10 reps each leg  in place with UE support   Pt requires buoyancy of water for support and unloading of joints; aquatic therapy needed so that pt can perform exercises with decreased weight bearing for  Reduced pain with AROM; viscosity of water needed for resistance for strengthening.                      PT Short Term Goals - 10/16/18 1123      PT SHORT TERM GOAL #1   Title  -        PT Long Term Goals - 11/09/18 1650      PT LONG TERM GOAL #2   Title  Patient will improve gait velocity to 1.0 ft/sec with Rt AFO and RW, demonstrating lesser fall risk            Plan - 11/09/18 1947    Clinical Impression Statement  Aquatic session focused on pt getting acclimated to pool due to increased anxiety and fear of water; time spent with pt only ambulating various directions in pool with tremors noted in RLE with pt reporting feeling as if legs were going to buckle.  Flotation belt used in addition to UE support on pool edge and min hand held assist from PT to increase feeling of security and stabilization for increased comfort and decreased anxiety in pool.      Rehab Potential  Fair    Clinical Impairments Affecting Rehab Potential  decr safety awareness with pattern of falls due to poor judgement (walking without AFO, walking without RW, sitting on rolling chair)    PT Frequency  1x / week    PT Duration  4 weeks    PT Treatment/Interventions  Manual techniques;Patient/family education;Therapeutic exercise;Therapeutic activities;Gait training;ADLs/Self Care Home Management;Neuromuscular re-education;Functional mobility training;Passive range of motion;Balance training;Aquatic Therapy;Biofeedback;DME Instruction;Stair training;Orthotic Fit/Training;Dry needling    PT Next Visit Plan  referred to pool therapy; review HEP from [facility] (and update/modify as approp); fall prevention education; unweighting treadmill training for endurance, LE strengthening, gait training;     PT Home  Exercise Plan  seated ab sets and ball squeezes; leg lengthener and leg presses , standing static balance without UE    Consulted and Agree with Plan of Care  Patient       Patient will benefit from skilled therapeutic intervention in order to improve the following deficits and impairments:  Pain, Postural dysfunction, Difficulty walking, Decreased strength, Decreased activity tolerance, Abnormal gait, Increased muscle spasms, Decreased endurance, Decreased balance, Decreased coordination, Decreased knowledge of use of DME, Decreased mobility, Decreased safety awareness, Impaired sensation, Impaired tone  Visit Diagnosis: Difficulty in walking, not elsewhere classified  Muscle weakness (generalized)  Unsteadiness on feet     Problem  List Patient Active Problem List   Diagnosis Date Noted  . Lumbar spinal stenosis   . Radicular pain   . Gait disorder   . Difficulty in urination 07/13/2018  . Back pain 07/12/2018  . Hyperlipidemia 04/16/2018  . Generalized anxiety disorder with panic attacks 04/16/2018  . AKI (acute kidney injury) ([facility])   . Benign essential HTN   . Depression with anxiety   . Lumbar radiculopathy 03/09/2018  . Paraparesis ([place]) 03/09/2018  . Essential hypertension   . Chronic nonintractable headache   . Leukocytosis   . Acute blood loss anemia   . Postoperative pain   . Generalized weakness   . Spinal stenosis at L4-L5 level 03/04/2018  . Spinal stenosis of lumbar region 12/28/2015  . Chest pain 04/16/2015    Alda Lea, PT 11/09/2018, 7:57 PM  [facility] [address] [place], [name], [address] Phone: [phone]   Fax:  [phone]  Name: [name] MRN: [id number] Date of Birth: 08-Jul-1961

## 2018-11-11 ENCOUNTER — Ambulatory Visit: Payer: 59 | Admitting: Physical Therapy

## 2018-11-11 DIAGNOSIS — R262 Difficulty in walking, not elsewhere classified: Secondary | ICD-10-CM | POA: Diagnosis not present

## 2018-11-11 DIAGNOSIS — M5442 Lumbago with sciatica, left side: Secondary | ICD-10-CM | POA: Diagnosis not present

## 2018-11-11 DIAGNOSIS — G8929 Other chronic pain: Secondary | ICD-10-CM | POA: Diagnosis not present

## 2018-11-11 DIAGNOSIS — R29818 Other symptoms and signs involving the nervous system: Secondary | ICD-10-CM | POA: Diagnosis not present

## 2018-11-11 DIAGNOSIS — M5416 Radiculopathy, lumbar region: Secondary | ICD-10-CM | POA: Diagnosis not present

## 2018-11-11 DIAGNOSIS — M6281 Muscle weakness (generalized): Secondary | ICD-10-CM | POA: Diagnosis not present

## 2018-11-11 DIAGNOSIS — R2681 Unsteadiness on feet: Secondary | ICD-10-CM

## 2018-11-11 DIAGNOSIS — R2689 Other abnormalities of gait and mobility: Secondary | ICD-10-CM

## 2018-11-12 ENCOUNTER — Encounter: Payer: Self-pay | Admitting: Physical Therapy

## 2018-11-12 NOTE — Therapy (Signed)
[facility] [address] [place], [name], [address] Phone: [phone]   Fax:  [phone]  Physical Therapy Treatment  Patient Details  Name: [name] MRN: [id number] Date of Birth: [birth year] Referring Provider (PT): Meredith Staggers, MD   Encounter Date: 11/11/2018  PT End of Session - 11/12/18 1936    Visit Number  12    Number of Visits  28    Date for PT Re-Evaluation  01/10/19    Authorization Type  [facility]; ?worker's comp - email sent on 11-09-18 to insurance specialist asking to confirm which insurance is primary     PT Start Time  0845    PT Stop Time  0935    PT Time Calculation (min)  50 min       Past Medical History:  Diagnosis Date  . Anxiety   . Back pain    related to spinal stenosis and disc problem, radiates down left buttocks to leg., weakness occ.  [name] Dyspnea   . Headache   . Hyperlipidemia   . Hypertension   . PONV (postoperative nausea and vomiting)   . Vaginal foreign object    "Uses Femring"    Past Surgical History:  Procedure Laterality Date  . ABDOMINAL HYSTERECTOMY    . CARDIAC CATHETERIZATION N/A 04/18/2015   Procedure: Left Heart Cath and Coronary Angiography;  Surgeon: [name], MD;  Location: [facility];  Service: Cardiovascular;  Laterality: N/A;  . FOOT SURGERY Bilateral    [place] "bunion,bone spur, tendon" (1) -6'16, (1)-10'16  . IR EPIDUROGRAPHY  07/21/2018  . LUMBAR LAMINECTOMY/DECOMPRESSION MICRODISCECTOMY Bilateral 12/28/2015   Procedure: MICRO LUMBAR DECOMPRESSION L4 - L5 BILATERALLY;  Surgeon: [name], MD;  Location: [facility];  Service: Orthopedics;  Laterality: Bilateral;  . LUMBAR LAMINECTOMY/DECOMPRESSION MICRODISCECTOMY Bilateral 03/04/2018   Procedure: Revision of Microlumbar Decompression Bilateral Lumbar Four-Five;  Surgeon: [name], MD;  Location: [facility];  Service: Orthopedics;  Laterality: Bilateral;  90 mins   . WISDOM TOOTH EXTRACTION    . WOUND EXPLORATION N/A 03/04/2018   Procedure: EXPLORATION OF LUMBAR DECOMPRESSION WOUND;  Surgeon: [name], MD;  Location: [facility];  Service: Orthopedics;  Laterality: N/A;    There were no vitals filed for this visit.  Subjective Assessment - 11/12/18 1926    Subjective  Pt reports she is in alot of pain today - did not sleep well last night due to the pain; states she felt really good on Tues this week after having done the aquatic therapy on Mon. afternoon    Pertinent History  decompression and laminectomy on 12/28/15 at L4-L5  with redo decompression surgery on May 9,2019, post-op paraparesis with surgery to remove small hematoma; 07/12/18 fall at work (involved rolling chair) with hospital admission, transfer to [place] with d/c home 08/03/18; HTN, Bil foot surgery     Limitations  Standing;Walking;Sitting;Other (comment)    How long can you sit comfortably?  can't sit for long     How long can you stand comfortably?  can't stand for long     How long can you walk comfortably?  short community distances     Patient Stated Goals  decrease pain in legs and improve LE strength and walking    Currently in Pain?  Yes    Pain Score  8     Pain Location  Back    Pain Orientation  Lower    Pain Descriptors / Indicators  Aching;Radiating    Pain Type  Chronic pain    Pain Onset  More than a month ago    Pain Frequency  Constant                       [facility] Adult PT Treatment/Exercise - 11/12/18 0001      Transfers   Transfers  Sit to Stand    Sit to Stand  5: Supervision    Number of Reps  Other reps (comment)   1   Comments  requires 1 UE support for sit to stand from mat and braces LE's against mat for stabilization;  unable to perform without UE support      Ambulation/Gait   Ambulation/Gait  Yes    Ambulation/Gait Assistance  5: Supervision    Assistive device  Rolling walker    Gait Pattern  Step-through pattern;Decreased step length  - right;Decreased step length - left;Decreased dorsiflexion - right;Decreased weight shift to right;Right steppage;Right foot flat;Decreased hip/knee flexion - right    Ambulation Surface  Level;Indoor    Gait velocity  21.66 secs = 1.51 ft/sec       Timed Up and Go Test   Normal TUG (seconds)  52.88   with RW     Lumbar Exercises: Stretches   Single Knee to Chest Stretch  Right;Left;1 rep;20 seconds    Double Knee to Chest Stretch  1 rep;20 seconds    Quadruped Mid Back Stretch  2 reps   atttempted - unable to perform due to pain     Knee/Hip Exercises: Supine   Bridges  AROM;Other (comment)   2 reps attempted but unable to perform due to pain   Straight Leg Raises  AROM;Right;Left;1 set;10 reps      Knee/Hip Exercises: Sidelying   Hip ABduction  AROM;Right;Left;1 set;10 reps               PT Short Term Goals - 11/12/18 1942      PT SHORT TERM GOAL #1   Title  Improve TUG score from 52.88 secs to </= 40 secs with use of RW to demo improved mobility and reduced fall risk.    Baseline  52.88 secs - 11-11-18    Time  4    Period  Weeks    Status  New    Target Date  12/12/18      PT SHORT TERM GOAL #2   Title  Pt will increase gait velocity from 1.51 ft/sec to >/= 1.9 ft/sec with use of RW for incr. gait efficiency.    Baseline  21.66 secs = 1.51 ft/sec with RW - 11-11-18    Time  4    Period  Weeks    Status  New    Target Date  12/12/18      PT SHORT TERM GOAL #3   Title  Perform sit to stand with 1 UE support and stabilize independently (without bracing LE's against mat table) x 5 reps to demo improved LE strength.    Time  4    Period  Weeks    Status  New    Target Date  12/12/18      PT SHORT TERM GOAL #4   Title  Amb. 82' with SPC with CGA on flat, even surface.      Baseline  pt using RW for assist. with amb.    Time  4    Period  Weeks    Status  New    Target Date  12/12/18      PT SHORT TERM GOAL #5   Title  Independent in updated HEP  including aquatic exercise program.    Time  4    Period  Weeks    Status  New    Target Date  12/12/18        PT Long Term Goals - 11/12/18 1950      PT LONG TERM GOAL #1   Title  Improve TUG score to </= 25 secs with RW for reduced fall risk.    Baseline  52.88 secs with RW on 11-11-18    Time  8    Period  Weeks    Status  New    Target Date  01/10/19      PT LONG TERM GOAL #2   Title  Patient will improve gait velocity to >/= 2.3 ft/sec with Rt AFO and RW, demonstrating lesser fall risk    Baseline  21.66 secs = 1.51 ft/sec with RW    Time  8    Period  Weeks    Status  New    Target Date  01/10/19      PT LONG TERM GOAL #3   Title  Negotiate 4 steps with 1 hand rail using a step over step sequence.    Time  8    Period  Weeks    Status  New    Target Date  01/10/19      PT LONG TERM GOAL #4   Title  Modified independent household amb. without device (with use of AFO on RLE).    Time  8    Period  Weeks    Status  New    Target Date  01/10/19      PT LONG TERM GOAL #5   Title  Amb. 500' with SPC with SBA for increased community accessibility.    Time  8    Period  Weeks    Status  New    Target Date  01/10/19            Plan - 11/12/18 1938    Clinical Impression Statement  Pt is at high risk for fall per TUG score of 52.88 secs with use of RW; gait speed very slow (1.51 ft/sec with RW) which is impacted by back pain.  Pt is unable to perform bridge exercise and also unable to transfer sit to stand without UE support, both of which are impacted by pain as well as due to decreased strength in bil. LE's.      Rehab Potential  Good    PT Frequency  2x / week    PT Duration  8 weeks    PT Treatment/Interventions  Manual techniques;Patient/family education;Therapeutic exercise;Therapeutic activities;Gait training;ADLs/Self Care Home Management;Neuromuscular re-education;Functional mobility training;Passive range of motion;Balance training;Aquatic  Therapy;Biofeedback;DME Instruction;Stair training;Orthotic Fit/Training;Dry needling    PT Next Visit Plan  update HEP for LE strengthening and stretching; gait training    PT Home Exercise Plan  seated ab sets and ball squeezes; leg lengthener and leg presses , standing static balance without UE    Consulted and Agree with Plan of Care  Patient       Patient will benefit from skilled therapeutic intervention in order to improve the following deficits and impairments:  Pain, Postural dysfunction, Difficulty walking, Decreased strength, Decreased activity tolerance, Abnormal gait, Increased muscle spasms, Decreased endurance, Decreased balance, Decreased coordination, Decreased knowledge of use of  DME, Decreased mobility, Decreased safety awareness, Impaired sensation, Impaired tone  Visit Diagnosis: Other abnormalities of gait and mobility - Plan: PT plan of care cert/re-cert  Other symptoms and signs involving the nervous system - Plan: PT plan of care cert/re-cert  Muscle weakness (generalized) - Plan: PT plan of care cert/re-cert  Chronic left-sided low back pain with left-sided sciatica - Plan: PT plan of care cert/re-cert  Unsteadiness on feet - Plan: PT plan of care cert/re-cert     Problem List Patient Active Problem List   Diagnosis Date Noted  . Lumbar spinal stenosis   . Radicular pain   . Gait disorder   . Difficulty in urination 07/13/2018  . Back pain 07/12/2018  . Hyperlipidemia 04/16/2018  . Generalized anxiety disorder with panic attacks 04/16/2018  . AKI (acute kidney injury) ([place])   . Benign essential HTN   . Depression with anxiety   . Lumbar radiculopathy 03/09/2018  . Paraparesis ([place]) 03/09/2018  . Essential hypertension   . Chronic nonintractable headache   . Leukocytosis   . Acute blood loss anemia   . Postoperative pain   . Generalized weakness   . Spinal stenosis at L4-L5 level 03/04/2018  . Spinal stenosis of lumbar region 12/28/2015  . Chest  pain 04/16/2015    Alda Lea, PT 11/12/2018, 8:03 PM  [facility] [address] [place], [name], [address] Phone: [phone]   Fax:  [phone]  Name: [name] MRN: [id number] Date of Birth: [birth year]

## 2018-11-15 ENCOUNTER — Ambulatory Visit: Payer: 59 | Admitting: Physical Therapy

## 2018-11-15 DIAGNOSIS — M6281 Muscle weakness (generalized): Secondary | ICD-10-CM | POA: Diagnosis not present

## 2018-11-15 DIAGNOSIS — R2689 Other abnormalities of gait and mobility: Secondary | ICD-10-CM | POA: Diagnosis not present

## 2018-11-15 DIAGNOSIS — M5416 Radiculopathy, lumbar region: Secondary | ICD-10-CM | POA: Diagnosis not present

## 2018-11-15 DIAGNOSIS — R29818 Other symptoms and signs involving the nervous system: Secondary | ICD-10-CM | POA: Diagnosis not present

## 2018-11-15 DIAGNOSIS — R262 Difficulty in walking, not elsewhere classified: Secondary | ICD-10-CM | POA: Diagnosis not present

## 2018-11-15 DIAGNOSIS — R2681 Unsteadiness on feet: Secondary | ICD-10-CM

## 2018-11-15 DIAGNOSIS — M5442 Lumbago with sciatica, left side: Secondary | ICD-10-CM | POA: Diagnosis not present

## 2018-11-15 DIAGNOSIS — G8929 Other chronic pain: Secondary | ICD-10-CM | POA: Diagnosis not present

## 2018-11-16 NOTE — Therapy (Signed)
[facility] [address] [place], [name], [address] Phone: [phone]   Fax:  [phone]  Physical Therapy Treatment  Patient Details  Name: [name] MRN: [id number] Date of Birth: July 23, 1961 Referring Provider (PT): Meredith Staggers, MD   Encounter Date: 11/15/2018  PT End of Session - 11/16/18 1342    Visit Number  13    Number of Visits  28    Date for PT Re-Evaluation  01/10/19    Authorization Type  [facility]; ?worker's comp - email sent on 11-09-18 to insurance specialist asking to confirm which insurance is primary     PT Start Time  1445    PT Stop Time  1545    PT Time Calculation (min)  60 min    Equipment Utilized During Treatment  Other (comment)   flotation belt used   Activity Tolerance  Patient tolerated treatment well    Behavior During Therapy  [facility] for tasks assessed/performed       Past Medical History:  Diagnosis Date  . Anxiety   . Back pain    related to spinal stenosis and disc problem, radiates down left buttocks to leg., weakness occ.  [name] Dyspnea   . Headache   . Hyperlipidemia   . Hypertension   . PONV (postoperative nausea and vomiting)   . Vaginal foreign object    "Uses Femring"    Past Surgical History:  Procedure Laterality Date  . ABDOMINAL HYSTERECTOMY    . CARDIAC CATHETERIZATION N/A 04/18/2015   Procedure: Left Heart Cath and Coronary Angiography;  Surgeon: [name], MD;  Location: [facility];  Service: Cardiovascular;  Laterality: N/A;  . FOOT SURGERY Bilateral    [place] "bunion,bone spur, tendon" (1) -6'16, (1)-10'16  . IR EPIDUROGRAPHY  07/21/2018  . LUMBAR LAMINECTOMY/DECOMPRESSION MICRODISCECTOMY Bilateral 12/28/2015   Procedure: MICRO LUMBAR DECOMPRESSION L4 - L5 BILATERALLY;  Surgeon: [name], MD;  Location: [facility];  Service: Orthopedics;  Laterality: Bilateral;  . LUMBAR LAMINECTOMY/DECOMPRESSION MICRODISCECTOMY  Bilateral 03/04/2018   Procedure: Revision of Microlumbar Decompression Bilateral Lumbar Four-Five;  Surgeon: [name], MD;  Location: [facility];  Service: Orthopedics;  Laterality: Bilateral;  90 mins  . WISDOM TOOTH EXTRACTION    . WOUND EXPLORATION N/A 03/04/2018   Procedure: EXPLORATION OF LUMBAR DECOMPRESSION WOUND;  Surgeon: [name], MD;  Location: [facility];  Service: Orthopedics;  Laterality: N/A;    There were no vitals filed for this visit.  Subjective Assessment - 11/16/18 1341    Subjective  Pt presents for aquatic therapy session at [facility]: states her back is not hurting as much as it was last Thurs. during PT session    Pertinent History  decompression and laminectomy on 12/28/15 at L4-L5  with redo decompression surgery on May 9,2019, post-op paraparesis with surgery to remove small hematoma; 07/12/18 fall at work (involved rolling chair) with hospital admission, transfer to [place] with d/c home 08/03/18; HTN, Bil foot surgery     Patient Stated Goals  decrease pain in legs and improve LE strength and walking    Currently in Pain?  Yes    Pain Score  5     Pain Location  Back    Pain Orientation  Lower    Pain Descriptors / Indicators  Aching;Discomfort;Tightness    Pain Onset  More than a month ago       Aquatic therapy - pool temp 87.2 degrees  Patient seen for aquatic  therapy today.  Treatment took place in water 3.5-4 feet deep depending upon activity.  Pt entered and exited  the pool via step negotiation with use of bil. Hand rails with CGA for safety.   Pt performed standing hip flexion, abduction and hip extension with knee extended 10 reps each leg with UE support on pool edge for assist with balance Buoyancy assisted for complete hip ROM Heel raises 10 reps with UE support; knee flexion bil. LE's 10 reps using viscosity of water for resistance   Marching in place 10 reps each leg with UE support Marching forward 35' across pool with UE support on noodle with mod to min  assist from PT to stabilize  Squats x 10 reps; single limb squats 10 reps each leg with UE support on edge of pool  Pt performed water walking forwards, backwards and sideways  40' x 2 reps each direction with UE support on noodle for assist with balance  Supine exercises - noodle used under arms, aquajogger used for flotation and pt was supported by PT; pt performed hip abduction/adduction 3 sets 10 reps using viscosity of water for resistance knee to chest 10 reps each leg alternating (in supine position)   Pt requires aquatic therapy for the unweighting provided by the buoyancy of the water and needs the viscosity for strengthening LE's and trunk musc.; current of water provided perturbations for balance challenges and for trunk stabilization                  PT Short Term Goals - 11/12/18 1942      PT SHORT TERM GOAL #1   Title  Improve TUG score from 52.88 secs to </= 40 secs with use of RW to demo improved mobility and reduced fall risk.    Baseline  52.88 secs - 11-11-18    Time  4    Period  Weeks    Status  New    Target Date  12/12/18      PT SHORT TERM GOAL #2   Title  Pt will increase gait velocity from 1.51 ft/sec to >/= 1.9 ft/sec with use of RW for incr. gait efficiency.    Baseline  21.66 secs = 1.51 ft/sec with RW - 11-11-18    Time  4    Period  Weeks    Status  New    Target Date  12/12/18      PT SHORT TERM GOAL #3   Title  Perform sit to stand with 1 UE support and stabilize independently (without bracing LE's against mat table) x 5 reps to demo improved LE strength.    Time  4    Period  Weeks    Status  New    Target Date  12/12/18      PT SHORT TERM GOAL #4   Title  Amb. 57' with SPC with CGA on flat, even surface.      Baseline  pt using RW for assist. with amb.    Time  4    Period  Weeks    Status  New    Target Date  12/12/18      PT SHORT TERM GOAL #5   Title  Independent in updated HEP including aquatic exercise program.     Time  4    Period  Weeks    Status  New    Target Date  12/12/18        PT Long Term Goals - 11/12/18  1950      PT LONG TERM GOAL #1   Title  Improve TUG score to </= 25 secs with RW for reduced fall risk.    Baseline  52.88 secs with RW on 11-11-18    Time  8    Period  Weeks    Status  New    Target Date  01/10/19      PT LONG TERM GOAL #2   Title  Patient will improve gait velocity to >/= 2.3 ft/sec with Rt AFO and RW, demonstrating lesser fall risk    Baseline  21.66 secs = 1.51 ft/sec with RW    Time  8    Period  Weeks    Status  New    Target Date  01/10/19      PT LONG TERM GOAL #3   Title  Negotiate 4 steps with 1 hand rail using a step over step sequence.    Time  8    Period  Weeks    Status  New    Target Date  01/10/19      PT LONG TERM GOAL #4   Title  Modified independent household amb. without device (with use of AFO on RLE).    Time  8    Period  Weeks    Status  New    Target Date  01/10/19      PT LONG TERM GOAL #5   Title  Amb. 500' with SPC with SBA for increased community accessibility.    Time  8    Period  Weeks    Status  New    Target Date  01/10/19            Plan - 11/16/18 1345    Clinical Impression Statement  Pt tolerated aquatic therapy session well with short frequent rest breaks needed due to pt reporting legs feeling weak during the session;  pt much less anxious and fearful in today's session than in initial session last week;  pt needed UE support on pool edge for assistance with standing balance when performing strengthening exercises on each leg    Rehab Potential  Good    Clinical Impairments Affecting Rehab Potential  decr safety awareness with pattern of falls due to poor judgement (walking without AFO, walking without RW, sitting on rolling chair)    PT Frequency  2x / week    PT Duration  8 weeks    PT Treatment/Interventions  Manual techniques;Patient/family education;Therapeutic exercise;Therapeutic  activities;Gait training;ADLs/Self Care Home Management;Neuromuscular re-education;Functional mobility training;Passive range of motion;Balance training;Aquatic Therapy;Biofeedback;DME Instruction;Stair training;Orthotic Fit/Training;Dry needling    PT Next Visit Plan  update HEP for LE strengthening and stretching; gait training    Consulted and Agree with Plan of Care  Patient       Patient will benefit from skilled therapeutic intervention in order to improve the following deficits and impairments:  Pain, Postural dysfunction, Difficulty walking, Decreased strength, Decreased activity tolerance, Abnormal gait, Increased muscle spasms, Decreased endurance, Decreased balance, Decreased coordination, Decreased knowledge of use of DME, Decreased mobility, Decreased safety awareness, Impaired sensation, Impaired tone  Visit Diagnosis: Other abnormalities of gait and mobility  Unsteadiness on feet     Problem List Patient Active Problem List   Diagnosis Date Noted  . Lumbar spinal stenosis   . Radicular pain   . Gait disorder   . Difficulty in urination 07/13/2018  . Back pain 07/12/2018  . Hyperlipidemia 04/16/2018  . Generalized anxiety disorder with  panic attacks 04/16/2018  . AKI (acute kidney injury) ([place])   . Benign essential HTN   . Depression with anxiety   . Lumbar radiculopathy 03/09/2018  . Paraparesis ([place]) 03/09/2018  . Essential hypertension   . Chronic nonintractable headache   . Leukocytosis   . Acute blood loss anemia   . Postoperative pain   . Generalized weakness   . Spinal stenosis at L4-L5 level 03/04/2018  . Spinal stenosis of lumbar region 12/28/2015  . Chest pain 04/16/2015    Alda Lea, PT 11/16/2018, 1:50 PM  [facility] [address] [place], [name], [address] Phone: [phone]   Fax:  [phone]  Name: [name] MRN: [id number] Date of Birth:  [birth year]

## 2018-11-18 ENCOUNTER — Ambulatory Visit: Payer: 59 | Admitting: Physical Therapy

## 2018-11-18 DIAGNOSIS — R2689 Other abnormalities of gait and mobility: Secondary | ICD-10-CM | POA: Diagnosis not present

## 2018-11-18 DIAGNOSIS — G8929 Other chronic pain: Secondary | ICD-10-CM | POA: Diagnosis not present

## 2018-11-18 DIAGNOSIS — M6281 Muscle weakness (generalized): Secondary | ICD-10-CM | POA: Diagnosis not present

## 2018-11-18 DIAGNOSIS — R2681 Unsteadiness on feet: Secondary | ICD-10-CM | POA: Diagnosis not present

## 2018-11-18 DIAGNOSIS — M5416 Radiculopathy, lumbar region: Secondary | ICD-10-CM | POA: Diagnosis not present

## 2018-11-18 DIAGNOSIS — R262 Difficulty in walking, not elsewhere classified: Secondary | ICD-10-CM | POA: Diagnosis not present

## 2018-11-18 DIAGNOSIS — M5442 Lumbago with sciatica, left side: Secondary | ICD-10-CM | POA: Diagnosis not present

## 2018-11-18 DIAGNOSIS — R29818 Other symptoms and signs involving the nervous system: Secondary | ICD-10-CM | POA: Diagnosis not present

## 2018-11-19 NOTE — Patient Instructions (Signed)
Gastroc / Heel Cord Stretch - On Step    Stand with heels over edge of stair. Holding rail, lower heels until stretch is felt in calf of legs. Repeat __1_ times. Do _3_ times per day.   HOLD 30-60 secs   Heel Cord Stretch    Place one leg forward, bent, other leg behind and straight. Lean forward keeping back heel flat. Hold _30-60___ seconds while counting out loud. Repeat with other leg. Repeat __1__ times. Do __3__ sessions per day.  http://gt2.exer.us/512   Copyright  VHI. All rights reserved.

## 2018-11-19 NOTE — Therapy (Signed)
[facility] [address] [place], [name], [address] Phone: [phone]   Fax:  [phone]  Physical Therapy Treatment  Patient Details  Name: [name] MRN: [id number] Date of Birth: [birth year] Referring Provider (PT): Meredith Staggers, MD   Encounter Date: 11/18/2018  PT End of Session - 11/19/18 0945    Visit Number  14    Number of Visits  28    Date for PT Re-Evaluation  01/10/19    Authorization Type  [facility]; ?worker's comp - email sent on 11-09-18 to insurance specialist asking to confirm which insurance is primary     PT Start Time  0845    PT Stop Time  0934    PT Time Calculation (min)  49 min    Equipment Utilized During Treatment  Gait belt;Back brace   lumbar corset - pt chose to wear today for pain control      Past Medical History:  Diagnosis Date  . Anxiety   . Back pain    related to spinal stenosis and disc problem, radiates down left buttocks to leg., weakness occ.  [name] Dyspnea   . Headache   . Hyperlipidemia   . Hypertension   . PONV (postoperative nausea and vomiting)   . Vaginal foreign object    "Uses Femring"    Past Surgical History:  Procedure Laterality Date  . ABDOMINAL HYSTERECTOMY    . CARDIAC CATHETERIZATION N/A 04/18/2015   Procedure: Left Heart Cath and Coronary Angiography;  Surgeon: [name], MD;  Location: [facility];  Service: Cardiovascular;  Laterality: N/A;  . FOOT SURGERY Bilateral    [place] "bunion,bone spur, tendon" (1) -6'16, (1)-10'16  . IR EPIDUROGRAPHY  07/21/2018  . LUMBAR LAMINECTOMY/DECOMPRESSION MICRODISCECTOMY Bilateral 12/28/2015   Procedure: MICRO LUMBAR DECOMPRESSION L4 - L5 BILATERALLY;  Surgeon: [name], MD;  Location[facility];  Service: Orthopedics;  Laterality: Bilateral;  . LUMBAR LAMINECTOMY/DECOMPRESSION MICRODISCECTOMY Bilateral 03/04/2018   Procedure: Revision of Microlumbar Decompression Bilateral  Lumbar Four-Five;  Surgeon: [name], MD;  Location: [facility];  Service: Orthopedics;  Laterality: Bilateral;  90 mins  . WISDOM TOOTH EXTRACTION    . WOUND EXPLORATION N/A 03/04/2018   Procedure: EXPLORATION OF LUMBAR DECOMPRESSION WOUND;  Surgeon: [name], MD;  Location: [facility];  Service: Orthopedics;  Laterality: N/A;    There were no vitals filed for this visit.  Subjective Assessment - 11/19/18 0934    Subjective  Pt reports no new problems -states she felt really good after the pool exercise on Monday; pt states she wore lumbar corset today for support to see if it would help with pain during exercises    Pertinent History  decompression and laminectomy on 12/28/15 at L4-L5  with redo decompression surgery on May 9,2019, post-op paraparesis with surgery to remove small hematoma; 07/12/18 fall at work (involved rolling chair) with hospital admission, transfer to [place] with d/c home 08/03/18; HTN, Bil foot surgery     Patient Stated Goals  decrease pain in legs and improve LE strength and walking                       [facility] Adult PT Treatment/Exercise - 11/19/18 0001      Transfers   Transfers  Sit to Stand    Sit to Stand  5: Supervision   RW in front of patient   Number of Reps  Other reps (comment)  8     Ambulation/Gait   Ambulation/Gait  Yes    Ambulation/Gait Assistance  4: Min guard    Ambulation/Gait Assistance Details  Rt AFO doffed for gait assessment    Ambulation Distance (Feet)  40 Feet   from mat to SciFit and then back to mat (80' total distance)   Assistive device  Rolling walker    Gait Pattern  Decreased dorsiflexion - right;Decreased step length - right;Right steppage   AFO not worn for gait assessment without use of AFO on RLE   Ambulation Surface  Level;Indoor      Lumbar Exercises: Supine   Pelvic Tilt  10 reps;5 seconds   5 sec hold     Knee/Hip Exercises: Stretches   Active Hamstring Stretch  Right;1 rep;30 seconds    Gastroc  Stretch  Right;1 rep;30 seconds   used 3" block wedge     Knee/Hip Exercises: Aerobic   Recumbent Bike  Scifit level 3.0 x 5" with UE's and LE's      Knee/Hip Exercises: Seated   Long Arc Quad  AROM;Right;1 set;10 reps      Knee/Hip Exercises: Supine   Straight Leg Raises  AROM;Right;Left;1 set;10 reps    Other Supine Knee/Hip Exercises  hip flexion RLE with knee flexed - small AROM due to c/o back pain      Knee/Hip Exercises: Sidelying   Hip ABduction  AROM;Right;1 set;10 reps    Clams  RLE - 10 reps no resistance      Moist heat applied to low back region with pt seated in chair at end of session - 15" (no charge)       PT Education - 11/19/18 0944    Education Details  pt instructed to place AFO under insert of shoe to avoid weight bearing directly on footplate of AFO (for comfort and skin protection)    Person(s) Educated  Patient    Methods  Explanation;Demonstration    Comprehension  Verbalized understanding       PT Short Term Goals - 11/12/18 1942      PT SHORT TERM GOAL #1   Title  Improve TUG score from 52.88 secs to </= 40 secs with use of RW to demo improved mobility and reduced fall risk.    Baseline  52.88 secs - 11-11-18    Time  4    Period  Weeks    Status  New    Target Date  12/12/18      PT SHORT TERM GOAL #2   Title  Pt will increase gait velocity from 1.51 ft/sec to >/= 1.9 ft/sec with use of RW for incr. gait efficiency.    Baseline  21.66 secs = 1.51 ft/sec with RW - 11-11-18    Time  4    Period  Weeks    Status  New    Target Date  12/12/18      PT SHORT TERM GOAL #3   Title  Perform sit to stand with 1 UE support and stabilize independently (without bracing LE's against mat table) x 5 reps to demo improved LE strength.    Time  4    Period  Weeks    Status  New    Target Date  12/12/18      PT SHORT TERM GOAL #4   Title  Amb. 42' with SPC with CGA on flat, even surface.      Baseline  pt using RW for assist. with amb.  Time  4     Period  Weeks    Status  New    Target Date  12/12/18      PT SHORT TERM GOAL #5   Title  Independent in updated HEP including aquatic exercise program.    Time  4    Period  Weeks    Status  New    Target Date  12/12/18        PT Long Term Goals - 11/12/18 1950      PT LONG TERM GOAL #1   Title  Improve TUG score to </= 25 secs with RW for reduced fall risk.    Baseline  52.88 secs with RW on 11-11-18    Time  8    Period  Weeks    Status  New    Target Date  01/10/19      PT LONG TERM GOAL #2   Title  Patient will improve gait velocity to >/= 2.3 ft/sec with Rt AFO and RW, demonstrating lesser fall risk    Baseline  21.66 secs = 1.51 ft/sec with RW    Time  8    Period  Weeks    Status  New    Target Date  01/10/19      PT LONG TERM GOAL #3   Title  Negotiate 4 steps with 1 hand rail using a step over step sequence.    Time  8    Period  Weeks    Status  New    Target Date  01/10/19      PT LONG TERM GOAL #4   Title  Modified independent household amb. without device (with use of AFO on RLE).    Time  8    Period  Weeks    Status  New    Target Date  01/10/19      PT LONG TERM GOAL #5   Title  Amb. 500' with SPC with SBA for increased community accessibility.    Time  8    Period  Weeks    Status  New    Target Date  01/10/19            Plan - 11/19/18 0948    Clinical Impression Statement  Pt tolerated LE strengthening exercises much better today compared to performance last Thursday, 11-11-18, at which time pt reported significant back pain; pt wore lumbar corset during PT today which seemed to help in pain control; pt stated sit to stand was much better with use of lumbar corset donned.  Pt has signficant foot drop RLE without use of AFO - instructed pt to wear AFO with ambulation for safety with gait.    Rehab Potential  Good    Clinical Impairments Affecting Rehab Potential  decr safety awareness with pattern of falls due to poor judgement  (walking without AFO, walking without RW, sitting on rolling chair)    PT Frequency  2x / week    PT Duration  8 weeks    PT Treatment/Interventions  Manual techniques;Patient/family education;Therapeutic exercise;Therapeutic activities;Gait training;ADLs/Self Care Home Management;Neuromuscular re-education;Functional mobility training;Passive range of motion;Balance training;Aquatic Therapy;Biofeedback;DME Instruction;Stair training;Orthotic Fit/Training;Dry needling    PT Next Visit Plan  update HEP for LE strengthening and stretching; gait training    PT Home Exercise Plan  seated ab sets and ball squeezes; leg lengthener and leg presses , standing static balance without UE; added runner's stretch and heel cord stretch to HEP    Consulted  and Agree with Plan of Care  Patient       Patient will benefit from skilled therapeutic intervention in order to improve the following deficits and impairments:  Pain, Postural dysfunction, Difficulty walking, Decreased strength, Decreased activity tolerance, Abnormal gait, Increased muscle spasms, Decreased endurance, Decreased balance, Decreased coordination, Decreased knowledge of use of DME, Decreased mobility, Decreased safety awareness, Impaired sensation, Impaired tone  Visit Diagnosis: Other abnormalities of gait and mobility  Muscle weakness (generalized)     Problem List Patient Active Problem List   Diagnosis Date Noted  . Lumbar spinal stenosis   . Radicular pain   . Gait disorder   . Difficulty in urination 07/13/2018  . Back pain 07/12/2018  . Hyperlipidemia 04/16/2018  . Generalized anxiety disorder with panic attacks 04/16/2018  . AKI (acute kidney injury) ([place])   . Benign essential HTN   . Depression with anxiety   . Lumbar radiculopathy 03/09/2018  . Paraparesis ([place]) 03/09/2018  . Essential hypertension   . Chronic nonintractable headache   . Leukocytosis   . Acute blood loss anemia   . Postoperative pain   .  Generalized weakness   . Spinal stenosis at L4-L5 level 03/04/2018  . Spinal stenosis of lumbar region 12/28/2015  . Chest pain 04/16/2015    Alda Lea, PT 11/19/2018, 9:53 AM  [facility] [address] [place], [name], [address] Phone: [phone]   Fax:  [phone]  Name: [name] MRN: [id number] Date of Birth: 23-Jun-1961

## 2018-11-22 DIAGNOSIS — M5416 Radiculopathy, lumbar region: Secondary | ICD-10-CM | POA: Diagnosis not present

## 2018-11-22 DIAGNOSIS — M961 Postlaminectomy syndrome, not elsewhere classified: Secondary | ICD-10-CM | POA: Diagnosis not present

## 2018-11-22 DIAGNOSIS — Z79891 Long term (current) use of opiate analgesic: Secondary | ICD-10-CM | POA: Diagnosis not present

## 2018-11-22 MED FILL — traMADol HCL 50 MG TABS: 50 | 20 days supply | Qty: 60 | Fill #0

## 2018-11-24 ENCOUNTER — Ambulatory Visit: Payer: 59 | Admitting: Physical Therapy

## 2018-11-24 DIAGNOSIS — R262 Difficulty in walking, not elsewhere classified: Secondary | ICD-10-CM | POA: Diagnosis not present

## 2018-11-24 DIAGNOSIS — M5442 Lumbago with sciatica, left side: Secondary | ICD-10-CM | POA: Diagnosis not present

## 2018-11-24 DIAGNOSIS — R29818 Other symptoms and signs involving the nervous system: Secondary | ICD-10-CM | POA: Diagnosis not present

## 2018-11-24 DIAGNOSIS — M6281 Muscle weakness (generalized): Secondary | ICD-10-CM

## 2018-11-24 DIAGNOSIS — R2681 Unsteadiness on feet: Secondary | ICD-10-CM | POA: Diagnosis not present

## 2018-11-24 DIAGNOSIS — M5416 Radiculopathy, lumbar region: Secondary | ICD-10-CM | POA: Diagnosis not present

## 2018-11-24 DIAGNOSIS — R2689 Other abnormalities of gait and mobility: Secondary | ICD-10-CM | POA: Diagnosis not present

## 2018-11-24 DIAGNOSIS — G8929 Other chronic pain: Secondary | ICD-10-CM | POA: Diagnosis not present

## 2018-11-25 ENCOUNTER — Ambulatory Visit: Payer: 59 | Admitting: Physical Therapy

## 2018-11-25 DIAGNOSIS — M5416 Radiculopathy, lumbar region: Secondary | ICD-10-CM | POA: Diagnosis not present

## 2018-11-25 DIAGNOSIS — R2689 Other abnormalities of gait and mobility: Secondary | ICD-10-CM | POA: Diagnosis not present

## 2018-11-25 DIAGNOSIS — G8929 Other chronic pain: Secondary | ICD-10-CM | POA: Diagnosis not present

## 2018-11-25 DIAGNOSIS — M6281 Muscle weakness (generalized): Secondary | ICD-10-CM

## 2018-11-25 DIAGNOSIS — M5442 Lumbago with sciatica, left side: Secondary | ICD-10-CM | POA: Diagnosis not present

## 2018-11-25 DIAGNOSIS — R262 Difficulty in walking, not elsewhere classified: Secondary | ICD-10-CM | POA: Diagnosis not present

## 2018-11-25 DIAGNOSIS — R2681 Unsteadiness on feet: Secondary | ICD-10-CM | POA: Diagnosis not present

## 2018-11-25 DIAGNOSIS — R29818 Other symptoms and signs involving the nervous system: Secondary | ICD-10-CM | POA: Diagnosis not present

## 2018-11-25 NOTE — Therapy (Signed)
[facility] [address] [place], [name], [address] Phone: [phone]   Fax:  [phone]  Physical Therapy Treatment  Patient Details  Name: [name] MRN: [id number] Date of Birth: [birth year] Referring Provider (PT): Meredith Staggers, MD   Encounter Date: 11/24/2018  PT End of Session - 11/25/18 2052    Visit Number  15    Number of Visits  28    Date for PT Re-Evaluation  01/10/19    Authorization Type  [facility]; ?worker's comp - email sent on 11-09-18 to insurance specialist asking to confirm which insurance is primary     PT Start Time  1400   session started 15" early   PT Stop Time  1500    PT Time Calculation (min)  60 min    Equipment Utilized During Treatment  Other (comment)   flotation belt   Activity Tolerance  Patient tolerated treatment well    Behavior During Therapy  [facility] for tasks assessed/performed       Past Medical History:  Diagnosis Date  . Anxiety   . Back pain    related to spinal stenosis and disc problem, radiates down left buttocks to leg., weakness occ.  [name] Dyspnea   . Headache   . Hyperlipidemia   . Hypertension   . PONV (postoperative nausea and vomiting)   . Vaginal foreign object    "Uses Femring"    Past Surgical History:  Procedure Laterality Date  . ABDOMINAL HYSTERECTOMY    . CARDIAC CATHETERIZATION N/A 04/18/2015   Procedure: Left Heart Cath and Coronary Angiography;  Surgeon: [name], MD;  Location: [place];  Service: Cardiovascular;  Laterality: N/A;  . FOOT SURGERY Bilateral    [place] "bunion,bone spur, tendon" (1) -6'16, (1)-10'16  . IR EPIDUROGRAPHY  07/21/2018  . LUMBAR LAMINECTOMY/DECOMPRESSION MICRODISCECTOMY Bilateral 12/28/2015   Procedure: MICRO LUMBAR DECOMPRESSION L4 - L5 BILATERALLY;  Surgeon: [name], MD;  Location: [facility];  Service: Orthopedics;  Laterality: Bilateral;  . LUMBAR LAMINECTOMY/DECOMPRESSION  MICRODISCECTOMY Bilateral 03/04/2018   Procedure: Revision of Microlumbar Decompression Bilateral Lumbar Four-Five;  Surgeon: [name], MD;  Location: [facility];  Service: Orthopedics;  Laterality: Bilateral;  90 mins  . WISDOM TOOTH EXTRACTION    . WOUND EXPLORATION N/A 03/04/2018   Procedure: EXPLORATION OF LUMBAR DECOMPRESSION WOUND;  Surgeon: [name], MD;  Location: [facility];  Service: Orthopedics;  Laterality: N/A;    There were no vitals filed for this visit.  Subjective Assessment - 11/25/18 2048    Subjective  Pt presents for aquatic therapy at [facility] - states she has had an upset stomach and has not eaten today - has been drinking water to stay hydrated    Pertinent History  decompression and laminectomy on 12/28/15 at L4-L5  with redo decompression surgery on May 9,2019, post-op paraparesis with surgery to remove small hematoma; 07/12/18 fall at work (involved rolling chair) with hospital admission, transfer to [place] with d/c home 08/03/18; HTN, Bil foot surgery     Patient Stated Goals  decrease pain in legs and improve LE strength and walking    Currently in Pain?  Yes    Pain Score  4     Pain Location  Back    Pain Orientation  Left    Pain Descriptors / Indicators  Aching;Discomfort;Dull    Pain Type  Chronic pain    Pain Onset  More than a month ago  Pain Frequency  Constant            Aquatic therapy - pool temp 87.4 degrees  Patient seen for aquatic therapy today.  Treatment took place in water 3.5-4 feet deep depending upon activity.  Pt entered and exited  the pool via step negotiation with use of bil. Hand rails with CGA for safety.  Pt performed standing runner's stretch x 30 sec hold each leg;  Heel cord stretch with toes on pool wall 30 sec hold x 1 rep  Pt performed standing hip flexion, abduction and hip extension with knee extended 10 reps each leg with UE support on pool edge for assist with balance  Heel raises 10 reps with UE support; knee flexion bil.  LE's 10 reps using viscosity of water for resistance   Marching in place 10 reps each leg with UE support Marching forward 50' across pool with UE support on noodle with mod to min assist from PT to stabilize - use of flotation belt to provide increased buoyancy for joint unloading   Squats x 10 reps with bil. UE support on edge of pool for support   Pt performed water walking forwards, backwards and sideways  50' x 2 reps each direction with UE support on noodle for assist with balance  Supine exercises - noodle used under arms, aquajogger used for flotation and pt was supported by PT; pt performed hip abduction/adduction 2 sets 10 reps using viscosity of water for resistance knee to chest 10 reps each leg alternating (in supine position) x 2 reps   Pt requires aquatic therapy for the unweighting provided by the buoyancy of the water and needs the viscosity for strengthening LE's and trunk musc.; current of water provided perturbations for balance challenges and for trunk stabilization                                    PT Short Term Goals - 11/12/18 1942      PT SHORT TERM GOAL #1   Title  Improve TUG score from 52.88 secs to </= 40 secs with use of RW to demo improved mobility and reduced fall risk.    Baseline  52.88 secs - 11-11-18    Time  4    Period  Weeks    Status  New    Target Date  12/12/18      PT SHORT TERM GOAL #2   Title  Pt will increase gait velocity from 1.51 ft/sec to >/= 1.9 ft/sec with use of RW for incr. gait efficiency.    Baseline  21.66 secs = 1.51 ft/sec with RW - 11-11-18    Time  4    Period  Weeks    Status  New    Target Date  12/12/18      PT SHORT TERM GOAL #3   Title  Perform sit to stand with 1 UE support and stabilize independently (without bracing LE's against mat table) x 5 reps to demo improved LE strength.    Time  4    Period  Weeks    Status  New    Target Date  12/12/18      PT SHORT TERM GOAL #4    Title  Amb. 15' with SPC with CGA on flat, even surface.      Baseline  pt using RW for assist. with amb.    Time  4  Period  Weeks    Status  New    Target Date  12/12/18      PT SHORT TERM GOAL #5   Title  Independent in updated HEP including aquatic exercise program.    Time  4    Period  Weeks    Status  New    Target Date  12/12/18        PT Long Term Goals - 11/12/18 1950      PT LONG TERM GOAL #1   Title  Improve TUG score to </= 25 secs with RW for reduced fall risk.    Baseline  52.88 secs with RW on 11-11-18    Time  8    Period  Weeks    Status  New    Target Date  01/10/19      PT LONG TERM GOAL #2   Title  Patient will improve gait velocity to >/= 2.3 ft/sec with Rt AFO and RW, demonstrating lesser fall risk    Baseline  21.66 secs = 1.51 ft/sec with RW    Time  8    Period  Weeks    Status  New    Target Date  01/10/19      PT LONG TERM GOAL #3   Title  Negotiate 4 steps with 1 hand rail using a step over step sequence.    Time  8    Period  Weeks    Status  New    Target Date  01/10/19      PT LONG TERM GOAL #4   Title  Modified independent household amb. without device (with use of AFO on RLE).    Time  8    Period  Weeks    Status  New    Target Date  01/10/19      PT LONG TERM GOAL #5   Title  Amb. 500' with SPC with SBA for increased community accessibility.    Time  8    Period  Weeks    Status  New    Target Date  01/10/19            Plan - 11/25/18 2053    Clinical Impression Statement  Pt tolerated aquatic therapy session well with pt performing LE strengthening exercises without use of flotation belt in first half of treatment session.  Pt's RLE became weak and trembly during water walking with pt needing assistance for approx. 15' to get to side of pool for support and stabilization.  Pt stated legs felt better after approx. 2" standing rest period.  PT stated she felt much better with reduced pain after aquatic therapy  session.                                                                                                      Rehab Potential  Good    Clinical Impairments Affecting Rehab Potential  decr safety awareness with pattern of falls due to poor judgement (walking without AFO, walking without RW, sitting on rolling chair)    PT Frequency  2x / week    PT Duration  8 weeks    PT Treatment/Interventions  Manual techniques;Patient/family education;Therapeutic exercise;Therapeutic activities;Gait training;ADLs/Self Care Home Management;Neuromuscular re-education;Functional mobility training;Passive range of motion;Balance training;Aquatic Therapy;Biofeedback;DME Instruction;Stair training;Orthotic Fit/Training;Dry needling    PT Next Visit Plan  update HEP for LE strengthening and stretching; gait training    PT Home Exercise Plan  seated ab sets and ball squeezes; leg lengthener and leg presses , standing static balance without UE; added runner's stretch and heel cord stretch to HEP    Consulted and Agree with Plan of Care  Patient       Patient will benefit from skilled therapeutic intervention in order to improve the following deficits and impairments:  Pain, Postural dysfunction, Difficulty walking, Decreased strength, Decreased activity tolerance, Abnormal gait, Increased muscle spasms, Decreased endurance, Decreased balance, Decreased coordination, Decreased knowledge of use of DME, Decreased mobility, Decreased safety awareness, Impaired sensation, Impaired tone  Visit Diagnosis: Other abnormalities of gait and mobility  Muscle weakness (generalized)     Problem List Patient Active Problem List   Diagnosis Date Noted  . Lumbar spinal stenosis   . Radicular pain   . Gait disorder   . Difficulty in urination 07/13/2018  . Back pain 07/12/2018  . Hyperlipidemia 04/16/2018  . Generalized anxiety disorder with panic attacks 04/16/2018  . AKI (acute kidney injury) ([place])   . Benign essential  HTN   . Depression with anxiety   . Lumbar radiculopathy 03/09/2018  . Paraparesis ([place]) 03/09/2018  . Essential hypertension   . Chronic nonintractable headache   . Leukocytosis   . Acute blood loss anemia   . Postoperative pain   . Generalized weakness   . Spinal stenosis at L4-L5 level 03/04/2018  . Spinal stenosis of lumbar region 12/28/2015  . Chest pain 04/16/2015    Alda Lea, PT 11/25/2018, 9:04 PM  [facility] [address] [place], [name], [address] Phone: [phone]   Fax:  [phone]  Name: [name] MRN: [id number] Date of Birth: 1961/05/29

## 2018-11-26 ENCOUNTER — Encounter: Payer: Self-pay | Admitting: Physical Therapy

## 2018-11-26 NOTE — Therapy (Signed)
[facility] [address] [place], [name], [address] Phone: [phone]   Fax:  [phone]  Physical Therapy Treatment  Patient Details  Name: [name] MRN: [id number] Date of Birth: 07-20-1961 Referring Provider (PT): Meredith Staggers, MD   Encounter Date: 11/25/2018  PT End of Session - 11/26/18 1411    Visit Number  16    Number of Visits  28    Date for PT Re-Evaluation  01/10/19    Authorization Type  [facility]; ?worker's comp - email sent on 11-09-18 to insurance specialist asking to confirm which insurance is primary     PT Start Time  0935    PT Stop Time  1020    PT Time Calculation (min)  45 min    Equipment Utilized During Treatment  Gait belt       Past Medical History:  Diagnosis Date  . Anxiety   . Back pain    related to spinal stenosis and disc problem, radiates down left buttocks to leg., weakness occ.  [name] Dyspnea   . Headache   . Hyperlipidemia   . Hypertension   . PONV (postoperative nausea and vomiting)   . Vaginal foreign object    "Uses Femring"    Past Surgical History:  Procedure Laterality Date  . ABDOMINAL HYSTERECTOMY    . CARDIAC CATHETERIZATION N/A 04/18/2015   Procedure: Left Heart Cath and Coronary Angiography;  Surgeon: [name], MD;  Location: [place] [facility];  Service: Cardiovascular;  Laterality: N/A;  . FOOT SURGERY Bilateral    [place] "bunion,bone spur, tendon" (1) -6'16, (1)-10'16  . IR EPIDUROGRAPHY  07/21/2018  . LUMBAR LAMINECTOMY/DECOMPRESSION MICRODISCECTOMY Bilateral 12/28/2015   Procedure: MICRO LUMBAR DECOMPRESSION L4 - L5 BILATERALLY;  Surgeon: [name], MD;  [facility];  Service: Orthopedics;  Laterality: Bilateral;  . LUMBAR LAMINECTOMY/DECOMPRESSION MICRODISCECTOMY Bilateral 03/04/2018   Procedure: Revision of Microlumbar Decompression Bilateral Lumbar Four-Five;  Surgeon: [name], MD;  Location: [facility];   Service: Orthopedics;  Laterality: Bilateral;  90 mins  . WISDOM TOOTH EXTRACTION    . WOUND EXPLORATION N/A 03/04/2018   Procedure: EXPLORATION OF LUMBAR DECOMPRESSION WOUND;  Surgeon: [name], MD;  Location: [facility];  Service: Orthopedics;  Laterality: N/A;    There were no vitals filed for this visit.  Subjective Assessment - 11/26/18 1405    Subjective  Pt amb. into clinic with RW without AFO on RLE: states she wanted to try not using the brace today    Pertinent History  decompression and laminectomy on 12/28/15 at L4-L5  with redo decompression surgery on May 9,2019, post-op paraparesis with surgery to remove small hematoma; 07/12/18 fall at work (involved rolling chair) with hospital admission, transfer to [place] with d/c home 08/03/18; HTN, Bil foot surgery     Patient Stated Goals  decrease pain in legs and improve LE strength and walking    Pain Score  4     Pain Location  Back    Pain Orientation  Left    Pain Descriptors / Indicators  Aching;Discomfort;Dull    Pain Type  Chronic pain    Pain Onset  More than a month ago    Pain Frequency  Constant                       [facility] Adult PT Treatment/Exercise - 11/26/18 0001      Knee/Hip Exercises: Stretches   Active  Hamstring Stretch  Right;1 rep;30 seconds   Runner's stretch   Gastroc Stretch  Right;1 rep;30 seconds      Knee/Hip Exercises: Standing   Forward Step Up  Right;1 set;10 reps;Hand Hold: 2;Step Height: 6"      Knee/Hip Exercises: Supine   Heel Slides  AROM;Both;1 set;10 reps    Bridges  AROM;Both;1 set;10 reps   incomplete ROM due to c/o back pain with bridging exercise    Straight Leg Raises  AROM;Right;1 set;10 reps    Other Supine Knee/Hip Exercises  hip flexion RLE with knee flexed - small AROM due to c/o back pain             PT Education - 11/26/18 1410    Education Details  reviewed heel cord stretch and runner's stretch in standing position - recommend pt to do 3-5 times/day for  bil. LE's    Person(s) Educated  Patient    Methods  Explanation;Demonstration   handout previously given   Comprehension  Verbalized understanding;Returned demonstration       PT Short Term Goals - 11/12/18 1942      PT SHORT TERM GOAL #1   Title  Improve TUG score from 52.88 secs to </= 40 secs with use of RW to demo improved mobility and reduced fall risk.    Baseline  52.88 secs - 11-11-18    Time  4    Period  Weeks    Status  New    Target Date  12/12/18      PT SHORT TERM GOAL #2   Title  Pt will increase gait velocity from 1.51 ft/sec to >/= 1.9 ft/sec with use of RW for incr. gait efficiency.    Baseline  21.66 secs = 1.51 ft/sec with RW - 11-11-18    Time  4    Period  Weeks    Status  New    Target Date  12/12/18      PT SHORT TERM GOAL #3   Title  Perform sit to stand with 1 UE support and stabilize independently (without bracing LE's against mat table) x 5 reps to demo improved LE strength.    Time  4    Period  Weeks    Status  New    Target Date  12/12/18      PT SHORT TERM GOAL #4   Title  Amb. 73' with SPC with CGA on flat, even surface.      Baseline  pt using RW for assist. with amb.    Time  4    Period  Weeks    Status  New    Target Date  12/12/18      PT SHORT TERM GOAL #5   Title  Independent in updated HEP including aquatic exercise program.    Time  4    Period  Weeks    Status  New    Target Date  12/12/18        PT Long Term Goals - 11/12/18 1950      PT LONG TERM GOAL #1   Title  Improve TUG score to </= 25 secs with RW for reduced fall risk.    Baseline  52.88 secs with RW on 11-11-18    Time  8    Period  Weeks    Status  New    Target Date  01/10/19      PT LONG TERM GOAL #2   Title  Patient will improve  gait velocity to >/= 2.3 ft/sec with Rt AFO and RW, demonstrating lesser fall risk    Baseline  21.66 secs = 1.51 ft/sec with RW    Time  8    Period  Weeks    Status  New    Target Date  01/10/19      PT LONG TERM  GOAL #3   Title  Negotiate 4 steps with 1 hand rail using a step over step sequence.    Time  8    Period  Weeks    Status  New    Target Date  01/10/19      PT LONG TERM GOAL #4   Title  Modified independent household amb. without device (with use of AFO on RLE).    Time  8    Period  Weeks    Status  New    Target Date  01/10/19      PT LONG TERM GOAL #5   Title  Amb. 500' with SPC with SBA for increased community accessibility.    Time  8    Period  Weeks    Status  New    Target Date  01/10/19            Plan - 11/26/18 1411    Clinical Impression Statement  Pt demonstrated increased Rt foot clearance with increased Rt dorsiflexion at end of session after stretching and strengthening exercises compared to Rt foot clearance prior to start of session as pt amb. from lobby to gym.  Rt foot was intermittently dragging floor, resulting in pt needing to utilize steppage gait pattern for RLE clearance.    Rehab Potential  Good    PT Frequency  2x / week    PT Duration  8 weeks    PT Treatment/Interventions  Manual techniques;Patient/family education;Therapeutic exercise;Therapeutic activities;Gait training;ADLs/Self Care Home Management;Neuromuscular re-education;Functional mobility training;Passive range of motion;Balance training;Aquatic Therapy;Biofeedback;DME Instruction;Stair training;Orthotic Fit/Training;Dry needling    PT Next Visit Plan  update HEP for LE strengthening and stretching; gait training    PT Home Exercise Plan  seated ab sets and ball squeezes; leg lengthener and leg presses , standing static balance without UE; added runner's stretch and heel cord stretch to HEP    Consulted and Agree with Plan of Care  Patient       Patient will benefit from skilled therapeutic intervention in order to improve the following deficits and impairments:  Pain, Postural dysfunction, Difficulty walking, Decreased strength, Decreased activity tolerance, Abnormal gait, Increased  muscle spasms, Decreased endurance, Decreased balance, Decreased coordination, Decreased knowledge of use of DME, Decreased mobility, Decreased safety awareness, Impaired sensation, Impaired tone  Visit Diagnosis: Other abnormalities of gait and mobility  Muscle weakness (generalized)  Unsteadiness on feet     Problem List Patient Active Problem List   Diagnosis Date Noted  . Lumbar spinal stenosis   . Radicular pain   . Gait disorder   . Difficulty in urination 07/13/2018  . Back pain 07/12/2018  . Hyperlipidemia 04/16/2018  . Generalized anxiety disorder with panic attacks 04/16/2018  . AKI (acute kidney injury) ([place])   . Benign essential HTN   . Depression with anxiety   . Lumbar radiculopathy 03/09/2018  . Paraparesis ([place]) 03/09/2018  . Essential hypertension   . Chronic nonintractable headache   . Leukocytosis   . Acute blood loss anemia   . Postoperative pain   . Generalized weakness   . Spinal stenosis at L4-L5 level 03/04/2018  .  Spinal stenosis of lumbar region 12/28/2015  . Chest pain 04/16/2015    Alda Lea, PT 11/26/2018, 2:16 PM  [facility] [address] [place], [name], [address] Phone: [phone]   Fax:  [phone]  Name: [name] MRN: [id number] Date of Birth: 05/12/1961

## 2018-11-29 ENCOUNTER — Ambulatory Visit: Payer: 59 | Admitting: Physical Therapy

## 2018-11-29 DIAGNOSIS — R2681 Unsteadiness on feet: Secondary | ICD-10-CM | POA: Insufficient documentation

## 2018-11-29 DIAGNOSIS — F419 Anxiety disorder, unspecified: Secondary | ICD-10-CM | POA: Diagnosis not present

## 2018-11-29 DIAGNOSIS — Z9889 Other specified postprocedural states: Secondary | ICD-10-CM | POA: Diagnosis not present

## 2018-11-29 DIAGNOSIS — G8929 Other chronic pain: Secondary | ICD-10-CM

## 2018-11-29 DIAGNOSIS — Z803 Family history of malignant neoplasm of breast: Secondary | ICD-10-CM | POA: Diagnosis not present

## 2018-11-29 DIAGNOSIS — M6281 Muscle weakness (generalized): Secondary | ICD-10-CM

## 2018-11-29 DIAGNOSIS — Z801 Family history of malignant neoplasm of trachea, bronchus and lung: Secondary | ICD-10-CM | POA: Diagnosis not present

## 2018-11-29 DIAGNOSIS — M5416 Radiculopathy, lumbar region: Secondary | ICD-10-CM | POA: Diagnosis not present

## 2018-11-29 DIAGNOSIS — R2689 Other abnormalities of gait and mobility: Secondary | ICD-10-CM | POA: Insufficient documentation

## 2018-11-29 DIAGNOSIS — I10 Essential (primary) hypertension: Secondary | ICD-10-CM | POA: Diagnosis not present

## 2018-11-29 DIAGNOSIS — Z809 Family history of malignant neoplasm, unspecified: Secondary | ICD-10-CM | POA: Diagnosis not present

## 2018-11-29 DIAGNOSIS — Z807 Family history of other malignant neoplasms of lymphoid, hematopoietic and related tissues: Secondary | ICD-10-CM | POA: Diagnosis not present

## 2018-11-29 DIAGNOSIS — Z800 Family history of malignant neoplasm of digestive organs: Secondary | ICD-10-CM | POA: Diagnosis not present

## 2018-11-29 DIAGNOSIS — M62838 Other muscle spasm: Secondary | ICD-10-CM | POA: Diagnosis not present

## 2018-11-29 DIAGNOSIS — Z808 Family history of malignant neoplasm of other organs or systems: Secondary | ICD-10-CM | POA: Diagnosis not present

## 2018-11-29 DIAGNOSIS — Z09 Encounter for follow-up examination after completed treatment for conditions other than malignant neoplasm: Secondary | ICD-10-CM | POA: Diagnosis not present

## 2018-11-29 DIAGNOSIS — M5442 Lumbago with sciatica, left side: Secondary | ICD-10-CM

## 2018-11-29 DIAGNOSIS — Z90710 Acquired absence of both cervix and uterus: Secondary | ICD-10-CM | POA: Diagnosis not present

## 2018-11-30 ENCOUNTER — Ambulatory Visit: Payer: 59 | Admitting: Physical Therapy

## 2018-11-30 DIAGNOSIS — M62838 Other muscle spasm: Secondary | ICD-10-CM | POA: Diagnosis not present

## 2018-11-30 DIAGNOSIS — F419 Anxiety disorder, unspecified: Secondary | ICD-10-CM | POA: Diagnosis not present

## 2018-11-30 DIAGNOSIS — M5416 Radiculopathy, lumbar region: Secondary | ICD-10-CM | POA: Diagnosis not present

## 2018-11-30 DIAGNOSIS — M5442 Lumbago with sciatica, left side: Secondary | ICD-10-CM

## 2018-11-30 DIAGNOSIS — G8929 Other chronic pain: Secondary | ICD-10-CM

## 2018-11-30 DIAGNOSIS — Z09 Encounter for follow-up examination after completed treatment for conditions other than malignant neoplasm: Secondary | ICD-10-CM | POA: Diagnosis not present

## 2018-11-30 DIAGNOSIS — Z90710 Acquired absence of both cervix and uterus: Secondary | ICD-10-CM | POA: Diagnosis not present

## 2018-11-30 DIAGNOSIS — Z800 Family history of malignant neoplasm of digestive organs: Secondary | ICD-10-CM | POA: Diagnosis not present

## 2018-11-30 DIAGNOSIS — M6281 Muscle weakness (generalized): Secondary | ICD-10-CM

## 2018-11-30 DIAGNOSIS — I10 Essential (primary) hypertension: Secondary | ICD-10-CM | POA: Diagnosis not present

## 2018-11-30 DIAGNOSIS — Z9889 Other specified postprocedural states: Secondary | ICD-10-CM | POA: Diagnosis not present

## 2018-11-30 DIAGNOSIS — Z808 Family history of malignant neoplasm of other organs or systems: Secondary | ICD-10-CM | POA: Diagnosis not present

## 2018-11-30 NOTE — Therapy (Signed)
[facility] [address] [place], [name], [address] Phone: [phone]   Fax:  [phone]  Physical Therapy Treatment  Patient Details  Name: [name] MRN: [id number] Date of Birth: 02/04/1961 Referring Provider (PT): Meredith Staggers, MD   Encounter Date: 11/29/2018  PT End of Session - 11/30/18 2101    Visit Number  17    Number of Visits  28    Date for PT Re-Evaluation  01/10/19    Authorization Type  [employer] [facility]; ?worker's comp - email sent on 11-09-18 to insurance specialist asking to confirm which insurance is primary     PT Start Time  1545    PT Stop Time  1630    PT Time Calculation (min)  45 min    Equipment Utilized During Treatment  Other (comment)   aquajogger belt used   Activity Tolerance  Patient tolerated treatment well    Behavior During Therapy  [facility] for tasks assessed/performed       Past Medical History:  Diagnosis Date  . Anxiety   . Back pain    related to spinal stenosis and disc problem, radiates down left buttocks to leg., weakness occ.  [name] Dyspnea   . Headache   . Hyperlipidemia   . Hypertension   . PONV (postoperative nausea and vomiting)   . Vaginal foreign object    "Uses Femring"    Past Surgical History:  Procedure Laterality Date  . ABDOMINAL HYSTERECTOMY    . CARDIAC CATHETERIZATION N/A 04/18/2015   Procedure: Left Heart Cath and Coronary Angiography;  Surgeon: [name], MD;  Location: [facility];  Service: Cardiovascular;  Laterality: N/A;  . FOOT SURGERY Bilateral    [place] "bunion,bone spur, tendon" (1) -6'16, (1)-10'16  . IR EPIDUROGRAPHY  07/21/2018  . LUMBAR LAMINECTOMY/DECOMPRESSION MICRODISCECTOMY Bilateral 12/28/2015   Procedure: MICRO LUMBAR DECOMPRESSION L4 - L5 BILATERALLY;  Surgeon: [name], MD;  Location: [facility];  Service: Orthopedics;  Laterality: Bilateral;  . LUMBAR LAMINECTOMY/DECOMPRESSION MICRODISCECTOMY  Bilateral 03/04/2018   Procedure: Revision of Microlumbar Decompression Bilateral Lumbar Four-Five;  Surgeon: [name], MD;  Location: [facility];  Service: Orthopedics;  Laterality: Bilateral;  90 mins  . WISDOM TOOTH EXTRACTION    . WOUND EXPLORATION N/A 03/04/2018   Procedure: EXPLORATION OF LUMBAR DECOMPRESSION WOUND;  Surgeon: [name], MD;  Location: [facility];  Service: Orthopedics;  Laterality: N/A;    There were no vitals filed for this visit.  Subjective Assessment - 11/30/18 2100    Subjective  Pt presents to aquatic therapy at [facility] - using RW for assistance with ambulation    Patient is accompained by:  Family member    Pertinent History  decompression and laminectomy on 12/28/15 at L4-L5  with redo decompression surgery on May 9,2019, post-op paraparesis with surgery to remove small hematoma; 07/12/18 fall at work (involved rolling chair) with hospital admission, transfer to [place] with d/c home 08/03/18; HTN, Bil foot surgery     Patient Stated Goals  decrease pain in legs and improve LE strength and walking    Currently in Pain?  Yes    Pain Score  3     Pain Location  Back    Pain Orientation  Left    Pain Descriptors / Indicators  Aching;Discomfort;Dull    Pain Type  Chronic pain    Pain Onset  More than a month ago    Pain Frequency  Constant  Aquatic therapy - pool temp 87.4 degrees  Patient seen for aquatic therapy today.  Treatment took place in water 3.5-4 feet deep depending upon activity.  Pt entered and exited  the pool via step negotiation with use of bil. Hand rails with CGA for safety.  Pt performed standing stretches - runner's stretch and heel cord stretch bil. LE's 30 sec hold 1 rep each stretch each leg  Pt performed standing hip flexion, abduction and hip extension with knee extended 10 reps each leg with UE support on pool edge for assist with balance  Heel raises 10 reps with UE support; knee flexion bil. LE's 10 reps using viscosity of water for  resistance   Marching in place 10 reps each leg with UE support  Squats x 10 reps with bil. UE support on pool edge  Pt performed water walking forwards, backwards and sideways  40' x 2 reps each direction with UE support on noodle for assist with balance  Pt requires aquatic therapy for the unweighting provided by the buoyancy of the water and needs the viscosity for strengthening LE's and trunk musc.; current of water provided perturbations for balance challenges and for trunk stabilization                                      PT Short Term Goals - 11/12/18 1942      PT SHORT TERM GOAL #1   Title  Improve TUG score from 52.88 secs to </= 40 secs with use of RW to demo improved mobility and reduced fall risk.    Baseline  52.88 secs - 11-11-18    Time  4    Period  Weeks    Status  New    Target Date  12/12/18      PT SHORT TERM GOAL #2   Title  Pt will increase gait velocity from 1.51 ft/sec to >/= 1.9 ft/sec with use of RW for incr. gait efficiency.    Baseline  21.66 secs = 1.51 ft/sec with RW - 11-11-18    Time  4    Period  Weeks    Status  New    Target Date  12/12/18      PT SHORT TERM GOAL #3   Title  Perform sit to stand with 1 UE support and stabilize independently (without bracing LE's against mat table) x 5 reps to demo improved LE strength.    Time  4    Period  Weeks    Status  New    Target Date  12/12/18      PT SHORT TERM GOAL #4   Title  Amb. 63' with SPC with CGA on flat, even surface.      Baseline  pt using RW for assist. with amb.    Time  4    Period  Weeks    Status  New    Target Date  12/12/18      PT SHORT TERM GOAL #5   Title  Independent in updated HEP including aquatic exercise program.    Time  4    Period  Weeks    Status  New    Target Date  12/12/18        PT Long Term Goals - 11/12/18 1950      PT LONG TERM GOAL #1   Title  Improve TUG score to </= 25 secs  with RW for reduced fall risk.     Baseline  52.88 secs with RW on 11-11-18    Time  8    Period  Weeks    Status  New    Target Date  01/10/19      PT LONG TERM GOAL #2   Title  Patient will improve gait velocity to >/= 2.3 ft/sec with Rt AFO and RW, demonstrating lesser fall risk    Baseline  21.66 secs = 1.51 ft/sec with RW    Time  8    Period  Weeks    Status  New    Target Date  01/10/19      PT LONG TERM GOAL #3   Title  Negotiate 4 steps with 1 hand rail using a step over step sequence.    Time  8    Period  Weeks    Status  New    Target Date  01/10/19      PT LONG TERM GOAL #4   Title  Modified independent household amb. without device (with use of AFO on RLE).    Time  8    Period  Weeks    Status  New    Target Date  01/10/19      PT LONG TERM GOAL #5   Title  Amb. 500' with SPC with SBA for increased community accessibility.    Time  8    Period  Weeks    Status  New    Target Date  01/10/19            Plan - 11/30/18 2103    Clinical Impression Statement  Pt continues to tolerate aquatic therapy well with buoyancy of water used for joint unloading and spinal decompression, resulting in reduced pain with AROM.  Legs did not get shaky or trembly today, indicative of increased strength and muscle endurance.      Rehab Potential  Good    Clinical Impairments Affecting Rehab Potential  decr safety awareness with pattern of falls due to poor judgement (walking without AFO, walking without RW, sitting on rolling chair)    PT Frequency  2x / week    PT Duration  8 weeks    PT Treatment/Interventions  Manual techniques;Patient/family education;Therapeutic exercise;Therapeutic activities;Gait training;ADLs/Self Care Home Management;Neuromuscular re-education;Functional mobility training;Passive range of motion;Balance training;Aquatic Therapy;Biofeedback;DME Instruction;Stair training;Orthotic Fit/Training;Dry needling    PT Next Visit Plan  update HEP for LE strengthening and stretching; gait  training    PT Home Exercise Plan  seated ab sets and ball squeezes; leg lengthener and leg presses , standing static balance without UE; added runner's stretch and heel cord stretch to HEP    Consulted and Agree with Plan of Care  Patient       Patient will benefit from skilled therapeutic intervention in order to improve the following deficits and impairments:  Pain, Postural dysfunction, Difficulty walking, Decreased strength, Decreased activity tolerance, Abnormal gait, Increased muscle spasms, Decreased endurance, Decreased balance, Decreased coordination, Decreased knowledge of use of DME, Decreased mobility, Decreased safety awareness, Impaired sensation, Impaired tone  Visit Diagnosis: Other abnormalities of gait and mobility  Muscle weakness (generalized)     Problem List Patient Active Problem List   Diagnosis Date Noted  . Lumbar spinal stenosis   . Radicular pain   . Gait disorder   . Difficulty in urination 07/13/2018  . Back pain 07/12/2018  . Hyperlipidemia 04/16/2018  . Generalized anxiety disorder with panic attacks 04/16/2018  .  AKI (acute kidney injury) ([place])   . Benign essential HTN   . Depression with anxiety   . Lumbar radiculopathy 03/09/2018  . Paraparesis ([place]) 03/09/2018  . Essential hypertension   . Chronic nonintractable headache   . Leukocytosis   . Acute blood loss anemia   . Postoperative pain   . Generalized weakness   . Spinal stenosis at L4-L5 level 03/04/2018  . Spinal stenosis of lumbar region 12/28/2015  . Chest pain 04/16/2015    [name], PT 11/30/2018, 9:09 PM  [facility] [address] [place], [name], [address] Phone: [phone]   Fax:  [phone]  Name: [name] MRN: [id number] Date of Birth: 01-Nov-1960

## 2018-12-01 NOTE — Therapy (Addendum)
[facility] [address] [place], [name], [address] Phone: [phone]   Fax:  [phone]  Physical Therapy Treatment  Patient Details  Name: [name] MRN: [id number] Date of Birth: 1961-10-07 Referring Provider (PT): Meredith Staggers, MD   Encounter Date: 11/30/2018  PT End of Session - 12/01/18 2219    Visit Number  18    Number of Visits  28    Date for PT Re-Evaluation  01/10/19    Authorization Type  [employer] [facility]; ?worker's comp - email sent on 11-09-18 to insurance specialist asking to confirm which insurance is primary     PT Start Time  1150    PT Stop Time  1236    PT Time Calculation (min)  46 min       Past Medical History:  Diagnosis Date  . Anxiety   . Back pain    related to spinal stenosis and disc problem, radiates down left buttocks to leg., weakness occ.  [name] Dyspnea   . Headache   . Hyperlipidemia   . Hypertension   . PONV (postoperative nausea and vomiting)   . Vaginal foreign object    "Uses Femring"    Past Surgical History:  Procedure Laterality Date  . ABDOMINAL HYSTERECTOMY    . CARDIAC CATHETERIZATION N/A 04/18/2015   Procedure: Left Heart Cath and Coronary Angiography;  Surgeon: [name], MD;  Location: [facility];  Service: Cardiovascular;  Laterality: N/A;  . FOOT SURGERY Bilateral    [place] "bunion,bone spur, tendon" (1) -6'16, (1)-10'16  . IR EPIDUROGRAPHY  07/21/2018  . LUMBAR LAMINECTOMY/DECOMPRESSION MICRODISCECTOMY Bilateral 12/28/2015   Procedure: MICRO LUMBAR DECOMPRESSION L4 - L5 BILATERALLY;  Surgeon: [name], MD;  [facility];  Service: Orthopedics;  Laterality: Bilateral;  . LUMBAR LAMINECTOMY/DECOMPRESSION MICRODISCECTOMY Bilateral 03/04/2018   Procedure: Revision of Microlumbar Decompression Bilateral Lumbar Four-Five;  Surgeon: [name], MD;  Location: [facility];  Service: Orthopedics;  Laterality: Bilateral;  90 mins   . WISDOM TOOTH EXTRACTION    . WOUND EXPLORATION N/A 03/04/2018   Procedure: EXPLORATION OF LUMBAR DECOMPRESSION WOUND;  Surgeon: [name], MD;  Location: [facility];  Service: Orthopedics;  Laterality: N/A;    There were no vitals filed for this visit.  Subjective Assessment - 12/01/18 2216    Subjective  Pt states she turned and twisted her back in getting up out of recliner in getting ready to come to PT - states her back really hurts right now    Patient is accompained by:  Family member    Pertinent History  decompression and laminectomy on 12/28/15 at L4-L5  with redo decompression surgery on May 9,2019, post-op paraparesis with surgery to remove small hematoma; 07/12/18 fall at work (involved rolling chair) with hospital admission, transfer to [place] with d/c home 08/03/18; HTN, Bil foot surgery     Patient Stated Goals  decrease pain in legs and improve LE strength and walking       TherEx:  Pelvic tilts x 10 reps with 5 sec hold Pelvic tilt with hip flexion in hooklying position - 10 reps each LE Hip abduction/adduction in hooklying position  Gentle heel slides - within pain free or tolerated ROM - 10 reps each leg  Pt performed lumbar stabilization exercises - sitting on SitFit - pelvic rocking, lateral weight shifts and anterior/posterio weight shifts for incr. Pelvic mobility10 reps each Pelvic circles clockwise and counterclockwise 5 reps 2 sets each direction  LAQ 10 reps each LE  Moist heat applied to low back in seated position at end of session - 15"                           PT Short Term Goals - 11/12/18 1942      PT SHORT TERM GOAL #1   Title  Improve TUG score from 52.88 secs to </= 40 secs with use of RW to demo improved mobility and reduced fall risk.    Baseline  52.88 secs - 11-11-18    Time  4    Period  Weeks    Status  New    Target Date  12/12/18      PT SHORT TERM GOAL #2   Title  Pt will increase gait velocity from 1.51 ft/sec to  >/= 1.9 ft/sec with use of RW for incr. gait efficiency.    Baseline  21.66 secs = 1.51 ft/sec with RW - 11-11-18    Time  4    Period  Weeks    Status  New    Target Date  12/12/18      PT SHORT TERM GOAL #3   Title  Perform sit to stand with 1 UE support and stabilize independently (without bracing LE's against mat table) x 5 reps to demo improved LE strength.    Time  4    Period  Weeks    Status  New    Target Date  12/12/18      PT SHORT TERM GOAL #4   Title  Amb. 65' with SPC with CGA on flat, even surface.      Baseline  pt using RW for assist. with amb.    Time  4    Period  Weeks    Status  New    Target Date  12/12/18      PT SHORT TERM GOAL #5   Title  Independent in updated HEP including aquatic exercise program.    Time  4    Period  Weeks    Status  New    Target Date  12/12/18        PT Long Term Goals - 11/12/18 1950      PT LONG TERM GOAL #1   Title  Improve TUG score to </= 25 secs with RW for reduced fall risk.    Baseline  52.88 secs with RW on 11-11-18    Time  8    Period  Weeks    Status  New    Target Date  01/10/19      PT LONG TERM GOAL #2   Title  Patient will improve gait velocity to >/= 2.3 ft/sec with Rt AFO and RW, demonstrating lesser fall risk    Baseline  21.66 secs = 1.51 ft/sec with RW    Time  8    Period  Weeks    Status  New    Target Date  01/10/19      PT LONG TERM GOAL #3   Title  Negotiate 4 steps with 1 hand rail using a step over step sequence.    Time  8    Period  Weeks    Status  New    Target Date  01/10/19      PT LONG TERM GOAL #4   Title  Modified independent household amb. without device (with use of AFO on RLE).    Time  8    Period  Weeks    Status  New    Target Date  01/10/19      PT LONG TERM GOAL #5   Title  Amb. 500' with SPC with SBA for increased community accessibility.    Time  8    Period  Weeks    Status  New    Target Date  01/10/19              Patient will benefit from  skilled therapeutic intervention in order to improve the following deficits and impairments:     Visit Diagnosis: Muscle weakness (generalized)  Chronic left-sided low back pain with left-sided sciatica     Problem List Patient Active Problem List   Diagnosis Date Noted  . Lumbar spinal stenosis   . Radicular pain   . Gait disorder   . Difficulty in urination 07/13/2018  . Back pain 07/12/2018  . Hyperlipidemia 04/16/2018  . Generalized anxiety disorder with panic attacks 04/16/2018  . AKI (acute kidney injury) ([place])   . Benign essential HTN   . Depression with anxiety   . Lumbar radiculopathy 03/09/2018  . Paraparesis ([place]) 03/09/2018  . Essential hypertension   . Chronic nonintractable headache   . Leukocytosis   . Acute blood loss anemia   . Postoperative pain   . Generalized weakness   . Spinal stenosis at L4-L5 level 03/04/2018  . Spinal stenosis of lumbar region 12/28/2015  . Chest pain 04/16/2015    Alda Lea, PT 12/01/2018, 10:24 PM  [facility] [address] [place], [name], [address] Phone: [phone]   Fax:  [phone]  Name: SUNDEEP CARY MRN: [id number] Date of Birth: January 19, 1961

## 2018-12-02 DIAGNOSIS — M961 Postlaminectomy syndrome, not elsewhere classified: Secondary | ICD-10-CM | POA: Diagnosis not present

## 2018-12-02 DIAGNOSIS — M5416 Radiculopathy, lumbar region: Secondary | ICD-10-CM | POA: Diagnosis not present

## 2018-12-06 ENCOUNTER — Ambulatory Visit: Payer: 59 | Admitting: Physical Therapy

## 2018-12-06 ENCOUNTER — Telehealth: Payer: Self-pay | Admitting: *Deleted

## 2018-12-06 ENCOUNTER — Other Ambulatory Visit: Payer: Self-pay | Admitting: Family Medicine

## 2018-12-06 ENCOUNTER — Encounter: Payer: 59 | Attending: Physical Medicine & Rehabilitation | Admitting: Physical Medicine & Rehabilitation

## 2018-12-06 ENCOUNTER — Encounter: Payer: Self-pay | Admitting: Physical Medicine & Rehabilitation

## 2018-12-06 VITALS — BP 137/85 | HR 74 | Resp 14 | Ht 68.0 in | Wt 155.0 lb

## 2018-12-06 DIAGNOSIS — I10 Essential (primary) hypertension: Secondary | ICD-10-CM | POA: Insufficient documentation

## 2018-12-06 DIAGNOSIS — G8220 Paraplegia, unspecified: Secondary | ICD-10-CM | POA: Diagnosis not present

## 2018-12-06 DIAGNOSIS — Z9889 Other specified postprocedural states: Secondary | ICD-10-CM | POA: Diagnosis not present

## 2018-12-06 DIAGNOSIS — M5416 Radiculopathy, lumbar region: Secondary | ICD-10-CM | POA: Diagnosis not present

## 2018-12-06 DIAGNOSIS — Z803 Family history of malignant neoplasm of breast: Secondary | ICD-10-CM | POA: Insufficient documentation

## 2018-12-06 DIAGNOSIS — M6281 Muscle weakness (generalized): Secondary | ICD-10-CM

## 2018-12-06 DIAGNOSIS — Z09 Encounter for follow-up examination after completed treatment for conditions other than malignant neoplasm: Secondary | ICD-10-CM | POA: Insufficient documentation

## 2018-12-06 DIAGNOSIS — R2689 Other abnormalities of gait and mobility: Secondary | ICD-10-CM

## 2018-12-06 DIAGNOSIS — Z801 Family history of malignant neoplasm of trachea, bronchus and lung: Secondary | ICD-10-CM | POA: Insufficient documentation

## 2018-12-06 DIAGNOSIS — M62838 Other muscle spasm: Secondary | ICD-10-CM | POA: Diagnosis not present

## 2018-12-06 DIAGNOSIS — Z90710 Acquired absence of both cervix and uterus: Secondary | ICD-10-CM | POA: Insufficient documentation

## 2018-12-06 DIAGNOSIS — F419 Anxiety disorder, unspecified: Secondary | ICD-10-CM | POA: Insufficient documentation

## 2018-12-06 DIAGNOSIS — Z800 Family history of malignant neoplasm of digestive organs: Secondary | ICD-10-CM | POA: Insufficient documentation

## 2018-12-06 DIAGNOSIS — Z809 Family history of malignant neoplasm, unspecified: Secondary | ICD-10-CM | POA: Insufficient documentation

## 2018-12-06 DIAGNOSIS — Z808 Family history of malignant neoplasm of other organs or systems: Secondary | ICD-10-CM | POA: Insufficient documentation

## 2018-12-06 DIAGNOSIS — Z807 Family history of other malignant neoplasms of lymphoid, hematopoietic and related tissues: Secondary | ICD-10-CM | POA: Insufficient documentation

## 2018-12-06 MED ORDER — TRAMADOL HCL 50 MG PO TABS: 50.0000 mg | ORAL_TABLET | Freq: Two times a day (BID) | ORAL | 2 refills | Status: DC

## 2018-12-06 MED FILL — CARVEDILOL 6.25 MG TABLET: 6.25 | 90 days supply | Qty: 180 | Fill #1

## 2018-12-06 NOTE — Patient Instructions (Signed)
PLEASE FEEL FREE TO CALL OUR OFFICE WITH ANY PROBLEMS OR QUESTIONS ([phone])      

## 2018-12-06 NOTE — Telephone Encounter (Signed)
[name] called back and said she was told Dr Naaman Plummer needs to be sure to put the disability information in his note and SS requests medical records and they will go off that information.

## 2018-12-06 NOTE — Progress Notes (Addendum)
Subjective:    Patient ID: [name], female    DOB: Jul 01, 1961, 58 y.o.   MRN: [id number]  HPI  Mrs. Oetken is here in follow-up of her lumbar radiculopathy and associated leg weakness and pain syndrome.  She has continued to attend therapies at [facility] outpatient neuro rehab. She is doing water therapies which have been very therapeutic for her. It has helped with her balance, strength, and confidence.  She has a friend who has forearm crutches and she asked whether these would be something she could use because "she hates her walker."  She received another back injection this past week by Dr. Nelva Bush which has provided some relief.  Otherwise for pain, she is using tramadol 50 mg every 8 hours as needed.  She also uses baclofen for spasms and Voltaren gel.  Bowel and bladder function has been fairly regular.  She maintains a regimen for her bowels which includes MiraLAX.  Pain Inventory Average Pain 4 Pain Right Now 4 My pain is burning, tingling and aching  In the last 24 hours, has pain interfered with the following? General activity 4 Relation with others 3 Enjoyment of life 7 What TIME of day is your pain at its worst? morning, evening  Sleep (in general) Fair  Pain is worse with: walking, bending, sitting, standing and some activites Pain improves with: rest, heat/ice, therapy/exercise, medication and injections Relief from Meds: 5  Mobility walk with assistance use a cane use a walker ability to climb steps?  yes  Function disabled: date disabled september 16th, 2019 I need assistance with the following:  dressing and household duties  Neuro/Psych weakness trouble walking spasms dizziness anxiety  Prior Studies Any changes since last visit?  no  Physicians involved in your care Any changes since last visit?  no   Family History  Problem Relation Age of Onset  . Heart attack Mother   . Lung cancer Father   . Cancer Father   .  Pancreatic cancer Sister   . Breast cancer Sister 37  . Throat cancer Brother   . Multiple myeloma Sister   . Breast cancer Sister        diagnosed in her 63's  . Heart attack Sister   . Stomach cancer Cousin   . Colon cancer Neg Hx    Social History   Socioeconomic History  . Marital status: Married    Spouse name: Not on file  . Number of children: Not on file  . Years of education: Not on file  . Highest education level: Not on file  Occupational History  . Not on file  Social Needs  . Financial resource strain: Not on file  . Food insecurity:    Worry: Not on file    Inability: Not on file  . Transportation needs:    Medical: Not on file    Non-medical: Not on file  Tobacco Use  . Smoking status: Never Smoker  . Smokeless tobacco: Never Used  Substance and Sexual Activity  . Alcohol use: No  . Drug use: No  . Sexual activity: Yes    Partners: Male    Birth control/protection: Post-menopausal  Lifestyle  . Physical activity:    Days per week: Not on file    Minutes per session: Not on file  . Stress: Not on file  Relationships  . Social connections:    Talks on phone: Not on file    Gets together: Not on file  Attends religious service: Not on file    Active member of club or organization: Not on file    Attends meetings of clubs or organizations: Not on file    Relationship status: Not on file  Other Topics Concern  . Not on file  Social History Narrative  . Not on file   Past Surgical History:  Procedure Laterality Date  . ABDOMINAL HYSTERECTOMY    . CARDIAC CATHETERIZATION N/A 04/18/2015   Procedure: Left Heart Cath and Coronary Angiography;  Surgeon: [name], MD;  Location: [place] [facility];  Service: Cardiovascular;  Laterality: N/A;  . FOOT SURGERY Bilateral    [place] "bunion,bone spur, tendon" (1) -6'16, (1)-10'16  . IR EPIDUROGRAPHY  07/21/2018  . LUMBAR LAMINECTOMY/DECOMPRESSION MICRODISCECTOMY Bilateral 12/28/2015    Procedure: MICRO LUMBAR DECOMPRESSION L4 - L5 BILATERALLY;  Surgeon: [name], MD;  Location: [facility];  Service: Orthopedics;  Laterality: Bilateral;  . LUMBAR LAMINECTOMY/DECOMPRESSION MICRODISCECTOMY Bilateral 03/04/2018   Procedure: Revision of Microlumbar Decompression Bilateral Lumbar Four-Five;  Surgeon: [name], MD;  Location: [facility];  Service: Orthopedics;  Laterality: Bilateral;  90 mins  . WISDOM TOOTH EXTRACTION    . WOUND EXPLORATION N/A 03/04/2018   Procedure: EXPLORATION OF LUMBAR DECOMPRESSION WOUND;  Surgeon: [name], MD;  Location: [place];  Service: Orthopedics;  Laterality: N/A;   Past Medical History:  Diagnosis Date  . Anxiety   . Back pain    related to spinal stenosis and disc problem, radiates down left buttocks to leg., weakness occ.  [name] Dyspnea   . Headache   . Hyperlipidemia   . Hypertension   . PONV (postoperative nausea and vomiting)   . Vaginal foreign object    "Uses Femring"   BP 137/85   Pulse 74   Resp 14   Ht '5\' 8"'$  (1.727 m)   Wt 155 lb (70.3 kg)   SpO2 97%   BMI 23.57 kg/m   Opioid Risk Score:   Fall Risk Score:  `1  Depression screen PHQ 2/9  Depression screen [facility] 2/9 08/17/2018 07/05/2018 05/03/2018 04/01/2018  Decreased Interest 0 0 0 1  Down, Depressed, Hopeless 0 0 0 1  PHQ - 2 Score 0 0 0 2  Altered sleeping - - - 1  Tired, decreased energy - - - 1  Change in appetite - - - 1  Feeling bad or failure about yourself  - - - 0  Trouble concentrating - - - 0  Moving slowly or fidgety/restless - - - 0  Suicidal thoughts - - - 0  PHQ-9 Score - - - 5  Difficult doing work/chores - - - Somewhat difficult    Review of Systems  Constitutional: Positive for diaphoresis.  HENT: Negative.   Eyes: Negative.   Respiratory: Negative.   Cardiovascular: Negative.   Gastrointestinal: Negative.   Endocrine: Negative.   Genitourinary: Negative.   Musculoskeletal: Positive for back pain and gait problem.       Spasms   Skin:  Negative.   Allergic/Immunologic: Negative.   Neurological: Positive for dizziness.  Hematological: Negative.   Psychiatric/Behavioral: The patient is nervous/anxious.        Objective:   Physical Exam  General: No acute distress HEENT: EOMI, oral membranes moist Cards: reg rate  Chest: normal effort Abdomen: Soft, NT, ND Skin: dry, intact Extremities: no edema  Neuro:Pt is cognitively appropriate with normal insight, memory, and awareness. Cranial nerves 2-12 are intact. Reflexes are 2+ in all 4's. Fine motor  coordination is intact. No tremors. Motor function is grossly 5/5in the upper extremities. Ongoing ankle dorsiflexion weakness bilaterally.  Right side also with plantar flexion weakness.  She does ambulate with improved posture and form with better knee bend and hip heights.  She does occasionally employ circumduction to help clear her feet.  Sometimes the shoe skids on the floor as well and sticks.  She walks at a very deliberate pace but uses good form as a result. Musculoskeletal: low back tender to palpation  Psych:Pt's affect is appropriate. Pt is cooperative      Assessment & Plan:  Medical Problem List and Plan: 1.Decreased functional mobilitysecondary to acute onchronic lumbar radiculopathy and foraminal stenosis bilaterally with decompression May 2019. more recent fall with exacerbation of pain and motor or sensory deficits.  -continue with outpt aquatic/land therapies with [facility]     -She is making gradual gains with her strength and gait.      -injections/surgical mgt per Dr. Verlan Friends     -the patient remains unable to work given her ongoing weakness, sensory loss, pain, associated functional deficits and ongoing therapy. I would expect her deficits to be long term, requiring rehab and a home exercise program for 12 months or more.  2. Pain Management:Neurontin: maintain '600mg'$  TID - baclofen '10mg'$   TID -continueUltram 50 mg every 12 hours. #60, NO RF           -We will continue the controlled substance monitoring program, this consists of regular clinic visits, examinations, routine drug screening, pill counts as well as use of [place] Controlled Substance Reporting System. [place] today.             -voltaren gel for left knee -Maintain outpt therapies to build up muscle strength and stamina           -            4. Mood:  -Patient appears to be very positive about her rehab and works hard 5. .Constipation:.  -Continue with current regimen   15 minutes of face to face patient care time were spent during this visit. All questions were encouraged and answered.

## 2018-12-07 NOTE — Therapy (Signed)
[facility] [facility] [address] [place], [name], [address] Phone: [phone]   Fax:  [phone]  Physical Therapy Treatment  Patient Details  Name: PRESLY STEINRUCK MRN: [id number] Date of Birth: 06-02-1961 Referring Provider (PT): Meredith Staggers, MD   Encounter Date: 12/06/2018  PT End of Session - 12/07/18 2054    Visit Number  19    Number of Visits  28    Date for PT Re-Evaluation  01/10/19    Authorization Type  [facility]; ?worker's comp - email sent on 11-09-18 to insurance specialist asking to confirm which insurance is primary     PT Start Time  1545    PT Stop Time  1630    PT Time Calculation (min)  45 min    Equipment Utilized During Treatment  Other (comment)   flotation belt   Activity Tolerance  Patient tolerated treatment well    Behavior During Therapy  [facility] for tasks assessed/performed       Past Medical History:  Diagnosis Date  . Anxiety   . Back pain    related to spinal stenosis and disc problem, radiates down left buttocks to leg., weakness occ.  [name] Dyspnea   . Headache   . Hyperlipidemia   . Hypertension   . PONV (postoperative nausea and vomiting)   . Vaginal foreign object    "Uses Femring"    Past Surgical History:  Procedure Laterality Date  . ABDOMINAL HYSTERECTOMY    . CARDIAC CATHETERIZATION N/A 04/18/2015   Procedure: Left Heart Cath and Coronary Angiography;  Surgeon: [name], MD;  Location: [place] [facility];  Service: Cardiovascular;  Laterality: N/A;  . FOOT SURGERY Bilateral    [place] "bunion,bone spur, tendon" (1) -6'16, (1)-10'16  . IR EPIDUROGRAPHY  07/21/2018  . LUMBAR LAMINECTOMY/DECOMPRESSION MICRODISCECTOMY Bilateral 12/28/2015   Procedure: MICRO LUMBAR DECOMPRESSION L4 - L5 BILATERALLY;  Surgeon: [name], MD;  [facility];  Service: Orthopedics;  Laterality: Bilateral;  . LUMBAR LAMINECTOMY/DECOMPRESSION MICRODISCECTOMY Bilateral  03/04/2018   Procedure: Revision of Microlumbar Decompression Bilateral Lumbar Four-Five;  Surgeon: [name], MD;  Location: [place];  Service: Orthopedics;  Laterality: Bilateral;  90 mins  . WISDOM TOOTH EXTRACTION    . WOUND EXPLORATION N/A 03/04/2018   Procedure: EXPLORATION OF LUMBAR DECOMPRESSION WOUND;  Surgeon: [name], MD;  Location: [facility];  Service: Orthopedics;  Laterality: N/A;    There were no vitals filed for this visit.  Subjective Assessment - 12/07/18 2050    Subjective  Pt presents for aquatic therapy at [facility] - states she received epidural injection on Friday, 12-03-18 and states that her legs are much weaker since injection was received     Pertinent History  decompression and laminectomy on 12/28/15 at L4-L5  with redo decompression surgery on May 9,2019, post-op paraparesis with surgery to remove small hematoma; 07/12/18 fall at work (involved rolling chair) with hospital admission, transfer to [place] with d/c home 08/03/18; HTN, Bil foot surgery     Limitations  Standing;Walking;Sitting;Other (comment)    Patient Stated Goals  decrease pain in legs and improve LE strength and walking    Currently in Pain?  Yes    Pain Score  1     Pain Location  Back    Pain Orientation  Lower    Pain Descriptors / Indicators  Aching;Discomfort    Pain Onset  More than a month ago    Pain Frequency  Intermittent  Pain Relieving Factors  injection has really helped to decrease the back pain per pt report on 12-06-18          Aquatic therapy - pool temp 87.4 degrees  Patient seen for aquatic therapy today.  Treatment took place in water 3.5-4 feet deep depending upon activity.  Pt entered and exited  the pool via step negotiation with use of bil. Hand rails with min assist for safety.   Pt performed standing hip flexion, abduction and hip extension with knee extended 10 reps each leg with UE support on pool edge for assist with balance  Marching in place 10 reps each leg with UE  support  Squats x 10 reps with bil. UE support on pool edge  Pt performed water walking forwards, backwards and sideways  50' x 2 reps each direction with UE support on noodle and with flotation belt worn for assist with balance  Pt requires aquatic therapy for the unweighting provided by the buoyancy of the water and needs the viscosity for strengthening LE's and trunk musc.; current of water provided perturbations for balance challenges and for trunk stabilization                                      PT Short Term Goals - 11/12/18 1942      PT SHORT TERM GOAL #1   Title  Improve TUG score from 52.88 secs to </= 40 secs with use of RW to demo improved mobility and reduced fall risk.    Baseline  52.88 secs - 11-11-18    Time  4    Period  Weeks    Status  New    Target Date  12/12/18      PT SHORT TERM GOAL #2   Title  Pt will increase gait velocity from 1.51 ft/sec to >/= 1.9 ft/sec with use of RW for incr. gait efficiency.    Baseline  21.66 secs = 1.51 ft/sec with RW - 11-11-18    Time  4    Period  Weeks    Status  New    Target Date  12/12/18      PT SHORT TERM GOAL #3   Title  Perform sit to stand with 1 UE support and stabilize independently (without bracing LE's against mat table) x 5 reps to demo improved LE strength.    Time  4    Period  Weeks    Status  New    Target Date  12/12/18      PT SHORT TERM GOAL #4   Title  Amb. 28' with SPC with CGA on flat, even surface.      Baseline  pt using RW for assist. with amb.    Time  4    Period  Weeks    Status  New    Target Date  12/12/18      PT SHORT TERM GOAL #5   Title  Independent in updated HEP including aquatic exercise program.    Time  4    Period  Weeks    Status  New    Target Date  12/12/18        PT Long Term Goals - 11/12/18 1950      PT LONG TERM GOAL #1   Title  Improve TUG score to </= 25 secs with RW for reduced fall risk.    Baseline  52.88 secs with RW  on 11-11-18    Time  8    Period  Weeks    Status  New    Target Date  01/10/19      PT LONG TERM GOAL #2   Title  Patient will improve gait velocity to >/= 2.3 ft/sec with Rt AFO and RW, demonstrating lesser fall risk    Baseline  21.66 secs = 1.51 ft/sec with RW    Time  8    Period  Weeks    Status  New    Target Date  01/10/19      PT LONG TERM GOAL #3   Title  Negotiate 4 steps with 1 hand rail using a step over step sequence.    Time  8    Period  Weeks    Status  New    Target Date  01/10/19      PT LONG TERM GOAL #4   Title  Modified independent household amb. without device (with use of AFO on RLE).    Time  8    Period  Weeks    Status  New    Target Date  01/10/19      PT LONG TERM GOAL #5   Title  Amb. 500' with SPC with SBA for increased community accessibility.    Time  8    Period  Weeks    Status  New    Target Date  01/10/19            Plan - 12/07/18 2055    Clinical Impression Statement  Increased weakness noted in bil. LE's today during aquatic therapy session; increased Rt foot drop noted with decreased Rt knee flexion in swing phase of gait noted as pt was amb. to pool steps for entry.  Pt demonstrated less tremors in legs at end of session compared to that at beginning of aquatic therapy session.      Rehab Potential  Good    Clinical Impairments Affecting Rehab Potential  decr safety awareness with pattern of falls due to poor judgement (walking without AFO, walking without RW, sitting on rolling chair)    PT Frequency  2x / week    PT Duration  8 weeks    PT Treatment/Interventions  Manual techniques;Patient/family education;Therapeutic exercise;Therapeutic activities;Gait training;ADLs/Self Care Home Management;Neuromuscular re-education;Functional mobility training;Passive range of motion;Balance training;Aquatic Therapy;Biofeedback;DME Instruction;Stair training;Orthotic Fit/Training;Dry needling    PT Next Visit Plan  update HEP for LE  strengthening and stretching; gait training    PT Home Exercise Plan  seated ab sets and ball squeezes; leg lengthener and leg presses , standing static balance without UE; added runner's stretch and heel cord stretch to HEP    Consulted and Agree with Plan of Care  Patient       Patient will benefit from skilled therapeutic intervention in order to improve the following deficits and impairments:  Pain, Postural dysfunction, Difficulty walking, Decreased strength, Decreased activity tolerance, Abnormal gait, Increased muscle spasms, Decreased endurance, Decreased balance, Decreased coordination, Decreased knowledge of use of DME, Decreased mobility, Decreased safety awareness, Impaired sensation, Impaired tone  Visit Diagnosis: Other abnormalities of gait and mobility  Muscle weakness (generalized)     Problem List Patient Active Problem List   Diagnosis Date Noted  . Lumbar spinal stenosis   . Radicular pain   . Gait disorder   . Difficulty in urination 07/13/2018  . Back pain 07/12/2018  . Hyperlipidemia 04/16/2018  . Generalized anxiety  disorder with panic attacks 04/16/2018  . AKI (acute kidney injury) ([place])   . Benign essential HTN   . Depression with anxiety   . Lumbar radiculopathy 03/09/2018  . Paraparesis ([place]) 03/09/2018  . Essential hypertension   . Chronic nonintractable headache   . Leukocytosis   . Acute blood loss anemia   . Postoperative pain   . Generalized weakness   . Spinal stenosis at L4-L5 level 03/04/2018  . Spinal stenosis of lumbar region 12/28/2015  . Chest pain 04/16/2015    Alda Lea, PT 12/07/2018, 9:01 PM  [facility] [address] [place], [name], [address] Phone: [phone]   Fax:  [phone]  Name: CHELBIE JARNAGIN MRN: [id number] Date of Birth: 1961/02/13

## 2018-12-08 ENCOUNTER — Other Ambulatory Visit: Payer: Self-pay | Admitting: Family Medicine

## 2018-12-08 DIAGNOSIS — M79605 Pain in left leg: Secondary | ICD-10-CM | POA: Diagnosis not present

## 2018-12-09 ENCOUNTER — Ambulatory Visit: Payer: 59 | Admitting: Physical Therapy

## 2018-12-09 DIAGNOSIS — M5416 Radiculopathy, lumbar region: Secondary | ICD-10-CM | POA: Diagnosis not present

## 2018-12-09 DIAGNOSIS — F419 Anxiety disorder, unspecified: Secondary | ICD-10-CM | POA: Diagnosis not present

## 2018-12-09 DIAGNOSIS — M62838 Other muscle spasm: Secondary | ICD-10-CM | POA: Diagnosis not present

## 2018-12-09 DIAGNOSIS — Z800 Family history of malignant neoplasm of digestive organs: Secondary | ICD-10-CM | POA: Diagnosis not present

## 2018-12-09 DIAGNOSIS — Z9889 Other specified postprocedural states: Secondary | ICD-10-CM | POA: Diagnosis not present

## 2018-12-09 DIAGNOSIS — M6281 Muscle weakness (generalized): Secondary | ICD-10-CM

## 2018-12-09 DIAGNOSIS — R2689 Other abnormalities of gait and mobility: Secondary | ICD-10-CM

## 2018-12-09 DIAGNOSIS — Z09 Encounter for follow-up examination after completed treatment for conditions other than malignant neoplasm: Secondary | ICD-10-CM | POA: Diagnosis not present

## 2018-12-09 DIAGNOSIS — I10 Essential (primary) hypertension: Secondary | ICD-10-CM | POA: Diagnosis not present

## 2018-12-09 DIAGNOSIS — Z90710 Acquired absence of both cervix and uterus: Secondary | ICD-10-CM | POA: Diagnosis not present

## 2018-12-09 DIAGNOSIS — Z808 Family history of malignant neoplasm of other organs or systems: Secondary | ICD-10-CM | POA: Diagnosis not present

## 2018-12-09 NOTE — Telephone Encounter (Signed)
Pt calling to check status. Please advise  °

## 2018-12-10 ENCOUNTER — Telehealth: Payer: Self-pay | Admitting: Physical Therapy

## 2018-12-10 DIAGNOSIS — M5416 Radiculopathy, lumbar region: Secondary | ICD-10-CM

## 2018-12-10 DIAGNOSIS — G8220 Paraplegia, unspecified: Secondary | ICD-10-CM

## 2018-12-10 NOTE — Telephone Encounter (Signed)
Dr. Naaman Plummer, I saw Wendee Copp yesterday in PT; we tried the [employer] crutches and she did really well with them (much better than I anticipated she would do).  If you agree, would you please place an order in Epic for [employer] crutches and we will get a pair for her -  Thank you so much, Guido Sander, PT

## 2018-12-10 NOTE — Therapy (Signed)
[facility] [address] [place], [name], [address] Phone: [phone]   Fax:  [phone]  Physical Therapy Treatment  Patient Details  Name: [name] MRN: [id number] Date of Birth: [birth year] Referring Provider (PT): Meredith Staggers, MD   Encounter Date: 12/09/2018  PT End of Session - 12/10/18 1009    Visit Number  20    Number of Visits  28    Date for PT Re-Evaluation  01/10/19    Authorization Type  [employer] [facility]; ?worker's comp - email sent on 11-09-18 to insurance specialist asking to confirm which insurance is primary     PT Start Time  [id number]    PT Stop Time  1020    PT Time Calculation (min)  46 min    Equipment Utilized During Treatment  Gait belt;Other (comment)   pt wore her lumbar support during tx session   Activity Tolerance  Patient tolerated treatment well    Behavior During Therapy  [facility] for tasks assessed/performed       Past Medical History:  Diagnosis Date  . Anxiety   . Back pain    related to spinal stenosis and disc problem, radiates down left buttocks to leg., weakness occ.  [name] Dyspnea   . Headache   . Hyperlipidemia   . Hypertension   . PONV (postoperative nausea and vomiting)   . Vaginal foreign object    "Uses Femring"    Past Surgical History:  Procedure Laterality Date  . ABDOMINAL HYSTERECTOMY    . CARDIAC CATHETERIZATION N/A 04/18/2015   Procedure: Left Heart Cath and Coronary Angiography;  Surgeon: [name], MD;  Location: [facility];  Service: Cardiovascular;  Laterality: N/A;  . FOOT SURGERY Bilateral    [place] "bunion,bone spur, tendon" (1) -6'16, (1)-10'16  . IR EPIDUROGRAPHY  07/21/2018  . LUMBAR LAMINECTOMY/DECOMPRESSION MICRODISCECTOMY Bilateral 12/28/2015   Procedure: MICRO LUMBAR DECOMPRESSION L4 - L5 BILATERALLY;  Surgeon: [name], MD;  Location: [facility];  Service: Orthopedics;  Laterality: Bilateral;  . LUMBAR  LAMINECTOMY/DECOMPRESSION MICRODISCECTOMY Bilateral 03/04/2018   Procedure: Revision of Microlumbar Decompression Bilateral Lumbar Four-Five;  Surgeon: [name], MD;  Location: [facility];  Service: Orthopedics;  Laterality: Bilateral;  90 mins  . WISDOM TOOTH EXTRACTION    . WOUND EXPLORATION N/A 03/04/2018   Procedure: EXPLORATION OF LUMBAR DECOMPRESSION WOUND;  Surgeon: [name], MD;  Location: [facility];  Service: Orthopedics;  Laterality: N/A;    There were no vitals filed for this visit.  Subjective Assessment - 12/10/18 0950    Subjective  Pt reports her legs are still weak since the injection but are not as weak as they were at the pool on Monday when she had aquatic therapy; pt wants to try using Lofstrand crutches for assistance with walking    Pertinent History  decompression and laminectomy on 12/28/15 at L4-L5  with redo decompression surgery on May 9,2019, post-op paraparesis with surgery to remove small hematoma; 07/12/18 fall at work (involved rolling chair) with hospital admission, transfer to [place] with d/c home 08/03/18; HTN, Bil foot surgery     Limitations  Standing;Walking;Sitting;Other (comment)    Patient Stated Goals  decrease pain in legs and improve LE strength and walking    Currently in Pain?  Yes    Pain Score  3     Pain Location  Back    Pain Orientation  Lower    Pain Descriptors / Indicators  Aching;Discomfort  Pain Type  Chronic pain    Pain Onset  More than a month ago    Pain Frequency  Intermittent                       [facility] Adult PT Treatment/Exercise - 12/10/18 0001      Ambulation/Gait   Ambulation/Gait  Yes  (Pended)     Ambulation/Gait Assistance  4: Min assist  (Pended)     Ambulation/Gait Assistance Details  pt wearing Rt AFO  (Pended)     Ambulation Distance (Feet)  155 Feet  (Pended)     Assistive device  Lofstrands  (Pended)     Gait Pattern  Decreased dorsiflexion - right;Decreased step length - right;Right steppage   (Pended)     Ambulation Surface  Level;Indoor  (Pended)       Knee/Hip Exercises: Aerobic   Recumbent Bike  SciFit level 2.5 x 12"   (Pended)       Pt was instructed in correct technique and hand placement with sit to/from stand transfers with use of Lofstrand crutches - Practiced 3 reps of sit to/from stand transfers  Pt gait trained approx 40' from SciFit to mat table with Lofstrand crutches at end of session - CGA to min assist Pt was given verbal cues for correct sequence with use of crutches in gait training         PT Short Term Goals - 11/12/18 1942      PT SHORT TERM GOAL #1   Title  Improve TUG score from 52.88 secs to </= 40 secs with use of RW to demo improved mobility and reduced fall risk.    Baseline  52.88 secs - 11-11-18    Time  4    Period  Weeks    Status  New    Target Date  12/12/18      PT SHORT TERM GOAL #2   Title  Pt will increase gait velocity from 1.51 ft/sec to >/= 1.9 ft/sec with use of RW for incr. gait efficiency.    Baseline  21.66 secs = 1.51 ft/sec with RW - 11-11-18    Time  4    Period  Weeks    Status  New    Target Date  12/12/18      PT SHORT TERM GOAL #3   Title  Perform sit to stand with 1 UE support and stabilize independently (without bracing LE's against mat table) x 5 reps to demo improved LE strength.    Time  4    Period  Weeks    Status  New    Target Date  12/12/18      PT SHORT TERM GOAL #4   Title  Amb. 22' with SPC with CGA on flat, even surface.      Baseline  pt using RW for assist. with amb.    Time  4    Period  Weeks    Status  New    Target Date  12/12/18      PT SHORT TERM GOAL #5   Title  Independent in updated HEP including aquatic exercise program.    Time  4    Period  Weeks    Status  New    Target Date  12/12/18        PT Long Term Goals - 11/12/18 1950      PT LONG TERM GOAL #1   Title  Improve TUG score to </=  25 secs with RW for reduced fall risk.    Baseline  52.88 secs with RW on  11-11-18    Time  8    Period  Weeks    Status  New    Target Date  01/10/19      PT LONG TERM GOAL #2   Title  Patient will improve gait velocity to >/= 2.3 ft/sec with Rt AFO and RW, demonstrating lesser fall risk    Baseline  21.66 secs = 1.51 ft/sec with RW    Time  8    Period  Weeks    Status  New    Target Date  01/10/19      PT LONG TERM GOAL #3   Title  Negotiate 4 steps with 1 hand rail using a step over step sequence.    Time  8    Period  Weeks    Status  New    Target Date  01/10/19      PT LONG TERM GOAL #4   Title  Modified independent household amb. without device (with use of AFO on RLE).    Time  8    Period  Weeks    Status  New    Target Date  01/10/19      PT LONG TERM GOAL #5   Title  Amb. 500' with SPC with SBA for increased community accessibility.    Time  8    Period  Weeks    Status  New    Target Date  01/10/19            Plan - 12/10/18 1011    Clinical Impression Statement  Pt did very well with use of Lofstrand crutches for assistance with ambulation; pt able to amb. approx. 155' with min assist; legs fatigued at end of thi distance with  tremors occurring with pt having difficulty advancing LE's - pt needed mod assist for balancewith turning with use of crutches with stand to sit transfer; pt stated she really liked using Lofstrand crutches and states she did not have as much discomfort in her back with use of crutches as she does with use of RW                                                                                    Rehab Potential  Good    Clinical Impairments Affecting Rehab Potential  decr safety awareness with pattern of falls due to poor judgement (walking without AFO, walking without RW, sitting on rolling chair)    PT Frequency  2x / week    PT Duration  8 weeks    PT Treatment/Interventions  Manual techniques;Patient/family education;Therapeutic exercise;Therapeutic activities;Gait training;ADLs/Self Care Home  Management;Neuromuscular re-education;Functional mobility training;Passive range of motion;Balance training;Aquatic Therapy;Biofeedback;DME Instruction;Stair training;Orthotic Fit/Training;Dry needling    PT Next Visit Plan   check STG's: update HEP for LE strengthening and stretching; gait training with crutches  (Pended)     PT Home Exercise Plan  seated ab sets and ball squeezes; leg lengthener and leg presses , standing static balance without UE; added runner's stretch and heel cord stretch to HEP    Consulted  and Agree with Plan of Care  Patient       Patient will benefit from skilled therapeutic intervention in order to improve the following deficits and impairments:  Pain, Postural dysfunction, Difficulty walking, Decreased strength, Decreased activity tolerance, Abnormal gait, Increased muscle spasms, Decreased endurance, Decreased balance, Decreased coordination, Decreased knowledge of use of DME, Decreased mobility, Decreased safety awareness, Impaired sensation, Impaired tone  Visit Diagnosis: Other abnormalities of gait and mobility  Muscle weakness (generalized)     Problem List Patient Active Problem List   Diagnosis Date Noted  . Lumbar spinal stenosis   . Radicular pain   . Gait disorder   . Difficulty in urination 07/13/2018  . Back pain 07/12/2018  . Hyperlipidemia 04/16/2018  . Generalized anxiety disorder with panic attacks 04/16/2018  . AKI (acute kidney injury) ([place])   . Benign essential HTN   . Depression with anxiety   . Lumbar radiculopathy 03/09/2018  . Paraparesis ([place]) 03/09/2018  . Essential hypertension   . Chronic nonintractable headache   . Leukocytosis   . Acute blood loss anemia   . Postoperative pain   . Generalized weakness   . Spinal stenosis at L4-L5 level 03/04/2018  . Spinal stenosis of lumbar region 12/28/2015  . Chest pain 04/16/2015    Alda Lea, PT 12/10/2018, 10:25 AM  [facility] [address] [place], [name], [address] Phone: [phone]   Fax:  [phone]  Name: BRIAN KOCOUREK MRN: [id number] Date of Birth: 06/22/1961

## 2018-12-11 NOTE — Telephone Encounter (Signed)
Amazing :)  Order written

## 2018-12-13 ENCOUNTER — Ambulatory Visit: Payer: 59 | Admitting: Physical Therapy

## 2018-12-13 DIAGNOSIS — R2681 Unsteadiness on feet: Secondary | ICD-10-CM

## 2018-12-13 DIAGNOSIS — I10 Essential (primary) hypertension: Secondary | ICD-10-CM | POA: Diagnosis not present

## 2018-12-13 DIAGNOSIS — Z808 Family history of malignant neoplasm of other organs or systems: Secondary | ICD-10-CM | POA: Diagnosis not present

## 2018-12-13 DIAGNOSIS — M62838 Other muscle spasm: Secondary | ICD-10-CM | POA: Diagnosis not present

## 2018-12-13 DIAGNOSIS — M5416 Radiculopathy, lumbar region: Secondary | ICD-10-CM | POA: Diagnosis not present

## 2018-12-13 DIAGNOSIS — Z09 Encounter for follow-up examination after completed treatment for conditions other than malignant neoplasm: Secondary | ICD-10-CM | POA: Diagnosis not present

## 2018-12-13 DIAGNOSIS — M6281 Muscle weakness (generalized): Secondary | ICD-10-CM

## 2018-12-13 DIAGNOSIS — Z90710 Acquired absence of both cervix and uterus: Secondary | ICD-10-CM | POA: Diagnosis not present

## 2018-12-13 DIAGNOSIS — Z800 Family history of malignant neoplasm of digestive organs: Secondary | ICD-10-CM | POA: Diagnosis not present

## 2018-12-13 DIAGNOSIS — Z9889 Other specified postprocedural states: Secondary | ICD-10-CM | POA: Diagnosis not present

## 2018-12-13 DIAGNOSIS — F419 Anxiety disorder, unspecified: Secondary | ICD-10-CM | POA: Diagnosis not present

## 2018-12-13 DIAGNOSIS — R2689 Other abnormalities of gait and mobility: Secondary | ICD-10-CM

## 2018-12-13 MED FILL — ALPRAZolam 0.25 MG TABS: 0.25 | 20 days supply | Qty: 20 | Fill #0

## 2018-12-13 NOTE — Therapy (Signed)
[facility] [address] [place], [name], [address] Phone: [phone]   Fax:  [phone]  Physical Therapy Treatment  Patient Details  Name: [name] MRN: [id number] Date of Birth: November 11, 1960 Referring Provider (PT): Meredith Staggers, MD   Encounter Date: 12/13/2018  PT End of Session - 12/13/18 1912    Visit Number  21    Number of Visits  28    Date for PT Re-Evaluation  01/10/19    Authorization Type  [employer] [facility] [employer] [facility]; ?worker's comp - email sent on 11-09-18 to insurance specialist asking to confirm which insurance is primary     PT Start Time  1500    PT Stop Time  1600   session extended due to cancellation after pt's appt.   PT Time Calculation (min)  60 min    Equipment Utilized During Treatment  Other (comment)   flotation belt   Activity Tolerance  Patient tolerated treatment well    Behavior During Therapy  [facility] for tasks assessed/performed       Past Medical History:  Diagnosis Date  . Anxiety   . Back pain    related to spinal stenosis and disc problem, radiates down left buttocks to leg., weakness occ.  [name] Dyspnea   . Headache   . Hyperlipidemia   . Hypertension   . PONV (postoperative nausea and vomiting)   . Vaginal foreign object    "Uses Femring"    Past Surgical History:  Procedure Laterality Date  . ABDOMINAL HYSTERECTOMY    . CARDIAC CATHETERIZATION N/A 04/18/2015   Procedure: Left Heart Cath and Coronary Angiography;  Surgeon: [name], MD;  Location: [facility];  Service: Cardiovascular;  Laterality: N/A;  . FOOT SURGERY Bilateral    [place] "bunion,bone spur, tendon" (1) -6'16, (1)-10'16  . IR EPIDUROGRAPHY  07/21/2018  . LUMBAR LAMINECTOMY/DECOMPRESSION MICRODISCECTOMY Bilateral 12/28/2015   Procedure: MICRO LUMBAR DECOMPRESSION L4 - L5 BILATERALLY;  Surgeon: [name], MD;  [facility];  Service: Orthopedics;  Laterality: Bilateral;  .  LUMBAR LAMINECTOMY/DECOMPRESSION MICRODISCECTOMY Bilateral 03/04/2018   Procedure: Revision of Microlumbar Decompression Bilateral Lumbar Four-Five;  Surgeon: [name], MD;  Location: [facility];  Service: Orthopedics;  Laterality: Bilateral;  90 mins  . WISDOM TOOTH EXTRACTION    . WOUND EXPLORATION N/A 03/04/2018   Procedure: EXPLORATION OF LUMBAR DECOMPRESSION WOUND;  Surgeon: [name], MD;  Location: [facility];  Service: Orthopedics;  Laterality: N/A;    There were no vitals filed for this visit.  Subjective Assessment - 12/13/18 1907    Subjective  Pt presents for aquatic therapy at [facility] - states legs are stronger than they were last week but still somewhat weak:     Pertinent History  decompression and laminectomy on 12/28/15 at L4-L5  with redo decompression surgery on May 9,2019, post-op paraparesis with surgery to remove small hematoma; 07/12/18 fall at work (involved rolling chair) with hospital admission, transfer to [facility] with d/c home 08/03/18; HTN, Bil foot surgery     Patient Stated Goals  decrease pain in legs and improve LE strength and walking    Currently in Pain?  Yes    Pain Score  4     Pain Location  Back    Pain Orientation  Lower    Pain Descriptors / Indicators  Aching;Discomfort    Pain Type  Chronic pain    Pain Onset  More than a month ago    Pain  Frequency  Intermittent           Aquatic therapy - pool temp 87.0 degrees  Patient seen for aquatic therapy today.  Treatment took place in water 3.5-4 feet deep depending upon activity.  Pt entered and exited  the pool via step negotiation with use of bil. Hand rails with CGA for safety.   Pt performed standing hip flexion, abduction and hip extension with knee extended 10 reps each leg with UE support on pool edge for assist with balance  Sit to stand from bench in water - without UE support - 10 reps; seated LAQ's 10 reps each leg  Marching in place 10 reps each leg with UE support  Ai Chi postures for core  stabilization - static stance only with UE movement  Squats x 10 reps with bil. UE support on pool edge  Pt performed water walking forwards, backwards and sideways  35' x 4 reps each direction with UE support on noodle and with flotation belt worn for assist with balance   Pt performed LE ROM exercises in supine - noodle under arms, nekdoodle used for head support and flotation belt used:  Hip abduction/adduction with feet flexed 10 reps, single knee to chest  10 reps and double knee to chest x 10 reps   Pt requires aquatic therapy for the unweighting provided by the buoyancy of the water and needs the viscosity for strengthening LE's and trunk musc.; current of water provided perturbations for balance challenges and for trunk stabilization                               PT Short Term Goals - 11/12/18 1942      PT SHORT TERM GOAL #1   Title  Improve TUG score from 52.88 secs to </= 40 secs with use of RW to demo improved mobility and reduced fall risk.    Baseline  52.88 secs - 11-11-18    Time  4    Period  Weeks    Status  New    Target Date  12/12/18      PT SHORT TERM GOAL #2   Title  Pt will increase gait velocity from 1.51 ft/sec to >/= 1.9 ft/sec with use of RW for incr. gait efficiency.    Baseline  21.66 secs = 1.51 ft/sec with RW - 11-11-18    Time  4    Period  Weeks    Status  New    Target Date  12/12/18      PT SHORT TERM GOAL #3   Title  Perform sit to stand with 1 UE support and stabilize independently (without bracing LE's against mat table) x 5 reps to demo improved LE strength.    Time  4    Period  Weeks    Status  New    Target Date  12/12/18      PT SHORT TERM GOAL #4   Title  Amb. 58' with SPC with CGA on flat, even surface.      Baseline  pt using RW for assist. with amb.    Time  4    Period  Weeks    Status  New    Target Date  12/12/18      PT SHORT TERM GOAL #5   Title  Independent in updated HEP including aquatic  exercise program.    Time  4    Period  Weeks    Status  New    Target Date  12/12/18        PT Long Term Goals - 11/12/18 1950      PT LONG TERM GOAL #1   Title  Improve TUG score to </= 25 secs with RW for reduced fall risk.    Baseline  52.88 secs with RW on 11-11-18    Time  8    Period  Weeks    Status  New    Target Date  01/10/19      PT LONG TERM GOAL #2   Title  Patient will improve gait velocity to >/= 2.3 ft/sec with Rt AFO and RW, demonstrating lesser fall risk    Baseline  21.66 secs = 1.51 ft/sec with RW    Time  8    Period  Weeks    Status  New    Target Date  01/10/19      PT LONG TERM GOAL #3   Title  Negotiate 4 steps with 1 hand rail using a step over step sequence.    Time  8    Period  Weeks    Status  New    Target Date  01/10/19      PT LONG TERM GOAL #4   Title  Modified independent household amb. without device (with use of AFO on RLE).    Time  8    Period  Weeks    Status  New    Target Date  01/10/19      PT LONG TERM GOAL #5   Title  Amb. 500' with SPC with SBA for increased community accessibility.    Time  8    Period  Weeks    Status  New    Target Date  01/10/19            Plan - 12/13/18 2012    Clinical Impression Statement  Aquatic therapy session focused on LE strengthening, core stabilization, and gait training.  Pt reported legs became weak during isometric hold of LE's with knees flexed.  Pt able to recover quickly with short standing rest break against wall.                                                                                                                                                                                Rehab Potential  Good    Clinical Impairments Affecting Rehab Potential  decr safety awareness with pattern of falls due to poor judgement (walking without AFO, walking without RW, sitting on rolling chair)    PT Frequency  2x / week    PT Duration  8 weeks  PT Treatment/Interventions   Manual techniques;Patient/family education;Therapeutic exercise;Therapeutic activities;Gait training;ADLs/Self Care Home Management;Neuromuscular re-education;Functional mobility training;Passive range of motion;Balance training;Aquatic Therapy;Biofeedback;DME Instruction;Stair training;Orthotic Fit/Training;Dry needling    PT Next Visit Plan  update HEP for LE strengthening and stretching; gait training    PT Home Exercise Plan  seated ab sets and ball squeezes; leg lengthener and leg presses , standing static balance without UE; added runner's stretch and heel cord stretch to HEP    Consulted and Agree with Plan of Care  Patient       Patient will benefit from skilled therapeutic intervention in order to improve the following deficits and impairments:  Pain, Postural dysfunction, Difficulty walking, Decreased strength, Decreased activity tolerance, Abnormal gait, Increased muscle spasms, Decreased endurance, Decreased balance, Decreased coordination, Decreased knowledge of use of DME, Decreased mobility, Decreased safety awareness, Impaired sensation, Impaired tone  Visit Diagnosis: Other abnormalities of gait and mobility  Muscle weakness (generalized)  Unsteadiness on feet     Problem List Patient Active Problem List   Diagnosis Date Noted  . Lumbar spinal stenosis   . Radicular pain   . Gait disorder   . Difficulty in urination 07/13/2018  . Back pain 07/12/2018  . Hyperlipidemia 04/16/2018  . Generalized anxiety disorder with panic attacks 04/16/2018  . AKI (acute kidney injury) ([place])   . Benign essential HTN   . Depression with anxiety   . Lumbar radiculopathy 03/09/2018  . Paraparesis ([place]) 03/09/2018  . Essential hypertension   . Chronic nonintractable headache   . Leukocytosis   . Acute blood loss anemia   . Postoperative pain   . Generalized weakness   . Spinal stenosis at L4-L5 level 03/04/2018  . Spinal stenosis of lumbar region 12/28/2015  . Chest pain  04/16/2015    Alda Lea, PT 12/13/2018, 8:24 PM  [facility] [address] [place], [name], [address] Phone: [phone]   Fax:  [phone]  Name: WYLODEAN SHIMMEL MRN: [id number] Date of Birth: 1961/05/31

## 2018-12-16 ENCOUNTER — Ambulatory Visit: Payer: 59 | Admitting: Physical Therapy

## 2018-12-16 DIAGNOSIS — M48061 Spinal stenosis, lumbar region without neurogenic claudication: Secondary | ICD-10-CM | POA: Diagnosis not present

## 2018-12-16 DIAGNOSIS — Z90710 Acquired absence of both cervix and uterus: Secondary | ICD-10-CM | POA: Diagnosis not present

## 2018-12-16 DIAGNOSIS — I10 Essential (primary) hypertension: Secondary | ICD-10-CM | POA: Diagnosis not present

## 2018-12-16 DIAGNOSIS — Z9889 Other specified postprocedural states: Secondary | ICD-10-CM | POA: Diagnosis not present

## 2018-12-16 DIAGNOSIS — Z09 Encounter for follow-up examination after completed treatment for conditions other than malignant neoplasm: Secondary | ICD-10-CM | POA: Diagnosis not present

## 2018-12-16 DIAGNOSIS — R269 Unspecified abnormalities of gait and mobility: Secondary | ICD-10-CM | POA: Diagnosis not present

## 2018-12-16 DIAGNOSIS — Z808 Family history of malignant neoplasm of other organs or systems: Secondary | ICD-10-CM | POA: Diagnosis not present

## 2018-12-16 DIAGNOSIS — M5416 Radiculopathy, lumbar region: Secondary | ICD-10-CM | POA: Diagnosis not present

## 2018-12-16 DIAGNOSIS — M62838 Other muscle spasm: Secondary | ICD-10-CM | POA: Diagnosis not present

## 2018-12-16 DIAGNOSIS — F419 Anxiety disorder, unspecified: Secondary | ICD-10-CM | POA: Diagnosis not present

## 2018-12-16 DIAGNOSIS — M545 Low back pain: Secondary | ICD-10-CM | POA: Diagnosis not present

## 2018-12-16 DIAGNOSIS — R2681 Unsteadiness on feet: Secondary | ICD-10-CM

## 2018-12-16 DIAGNOSIS — Z800 Family history of malignant neoplasm of digestive organs: Secondary | ICD-10-CM | POA: Diagnosis not present

## 2018-12-16 DIAGNOSIS — R2689 Other abnormalities of gait and mobility: Secondary | ICD-10-CM

## 2018-12-16 DIAGNOSIS — M961 Postlaminectomy syndrome, not elsewhere classified: Secondary | ICD-10-CM | POA: Diagnosis not present

## 2018-12-16 DIAGNOSIS — M6281 Muscle weakness (generalized): Secondary | ICD-10-CM

## 2018-12-17 ENCOUNTER — Encounter: Payer: Self-pay | Admitting: Physical Therapy

## 2018-12-17 NOTE — Therapy (Signed)
[facility] [address] [place], [name], [address] Phone: [phone]   Fax:  [phone]  Physical Therapy Treatment  Patient Details  Name: [name] MRN: [id number] Date of Birth: [birth year] Referring Provider (PT): Meredith Staggers, MD   Encounter Date: 12/16/2018  PT End of Session - 12/17/18 1734    Visit Number  22    Number of Visits  28    Date for PT Re-Evaluation  01/10/19    Authorization Type  [facility]; ?worker's comp - email sent on 11-09-18 to insurance specialist asking to confirm which insurance is primary     PT Start Time  1103    PT Stop Time  1151    PT Time Calculation (min)  48 min    Equipment Utilized During Treatment  Gait belt       Past Medical History:  Diagnosis Date  . Anxiety   . Back pain    related to spinal stenosis and disc problem, radiates down left buttocks to leg., weakness occ.  [name] Dyspnea   . Headache   . Hyperlipidemia   . Hypertension   . PONV (postoperative nausea and vomiting)   . Vaginal foreign object    "Uses Femring"    Past Surgical History:  Procedure Laterality Date  . ABDOMINAL HYSTERECTOMY    . CARDIAC CATHETERIZATION N/A 04/18/2015   Procedure: Left Heart Cath and Coronary Angiography;  Surgeon: [name], MD;  Location: [place] [facility];  Service: Cardiovascular;  Laterality: N/A;  . FOOT SURGERY Bilateral    [place] "bunion,bone spur, tendon" (1) -6'16, (1)-10'16  . IR EPIDUROGRAPHY  07/21/2018  . LUMBAR LAMINECTOMY/DECOMPRESSION MICRODISCECTOMY Bilateral 12/28/2015   Procedure: MICRO LUMBAR DECOMPRESSION L4 - L5 BILATERALLY;  Surgeon: [name], MD;  [facility];  Service: Orthopedics;  Laterality: Bilateral;  . LUMBAR LAMINECTOMY/DECOMPRESSION MICRODISCECTOMY Bilateral 03/04/2018   Procedure: Revision of Microlumbar Decompression Bilateral Lumbar Four-Five;  Surgeon: [name], MD;  Location: [place];   Service: Orthopedics;  Laterality: Bilateral;  90 mins  . WISDOM TOOTH EXTRACTION    . WOUND EXPLORATION N/A 03/04/2018   Procedure: EXPLORATION OF LUMBAR DECOMPRESSION WOUND;  Surgeon: [name], MD;  Location: [place];  Service: Orthopedics;  Laterality: N/A;    There were no vitals filed for this visit.  Subjective Assessment - 12/17/18 1729    Subjective  Pt states she started having  burning pain in left foot yesterday - pt asks what is it? Informed pt that is nerve pain    Pertinent History  decompression and laminectomy on 12/28/15 at L4-L5  with redo decompression surgery on May 9,2019, post-op paraparesis with surgery to remove small hematoma; 07/12/18 fall at work (involved rolling chair) with hospital admission, transfer to [place] with d/c home 08/03/18; HTN, Bil foot surgery     Limitations  Standing;Walking;Sitting;Other (comment)    Patient Stated Goals  decrease pain in legs and improve LE strength and walking                       [facility] Adult PT Treatment/Exercise - 12/17/18 0001      Ambulation/Gait   Ambulation/Gait  Yes    Ambulation/Gait Assistance  4: Min guard    Ambulation/Gait Assistance Details  pt wearing Rt AFO    Ambulation Distance (Feet)  250 Feet    Assistive device  Lofstrands    Gait Pattern  Decreased dorsiflexion - right;Decreased  step length - right;Right steppage    Ambulation Surface  Level;Indoor    Stairs  Yes    Stairs Assistance  4: Min guard    Stair Management Technique  With crutches;Forwards;Step to pattern    Number of Stairs  4   3 reps (12 steps total)   Height of Stairs  6    Ramp  4: Min assist    Ramp Details (indicate cue type and reason)  min to CGA with use of Lofstrand crutches     Curb  4: Min assist;Other (comment)   with Lofstrand crutches    Curb Details (indicate cue type and reason)  cues for correct sequence       Knee/Hip Exercises: Aerobic   Recumbent Bike  SciFit level 2.5 x 8"                 PT Short Term Goals - 11/12/18 1942      PT SHORT TERM GOAL #1   Title  Improve TUG score from 52.88 secs to </= 40 secs with use of RW to demo improved mobility and reduced fall risk.    Baseline  52.88 secs - 11-11-18    Time  4    Period  Weeks    Status  New    Target Date  12/12/18      PT SHORT TERM GOAL #2   Title  Pt will increase gait velocity from 1.51 ft/sec to >/= 1.9 ft/sec with use of RW for incr. gait efficiency.    Baseline  21.66 secs = 1.51 ft/sec with RW - 11-11-18    Time  4    Period  Weeks    Status  New    Target Date  12/12/18      PT SHORT TERM GOAL #3   Title  Perform sit to stand with 1 UE support and stabilize independently (without bracing LE's against mat table) x 5 reps to demo improved LE strength.    Time  4    Period  Weeks    Status  New    Target Date  12/12/18      PT SHORT TERM GOAL #4   Title  Amb. 12' with SPC with CGA on flat, even surface.      Baseline  pt using RW for assist. with amb.    Time  4    Period  Weeks    Status  New    Target Date  12/12/18      PT SHORT TERM GOAL #5   Title  Independent in updated HEP including aquatic exercise program.    Time  4    Period  Weeks    Status  New    Target Date  12/12/18        PT Long Term Goals - 11/12/18 1950      PT LONG TERM GOAL #1   Title  Improve TUG score to </= 25 secs with RW for reduced fall risk.    Baseline  52.88 secs with RW on 11-11-18    Time  8    Period  Weeks    Status  New    Target Date  01/10/19      PT LONG TERM GOAL #2   Title  Patient will improve gait velocity to >/= 2.3 ft/sec with Rt AFO and RW, demonstrating lesser fall risk    Baseline  21.66 secs = 1.51 ft/sec with RW  Time  8    Period  Weeks    Status  New    Target Date  01/10/19      PT LONG TERM GOAL #3   Title  Negotiate 4 steps with 1 hand rail using a step over step sequence.    Time  8    Period  Weeks    Status  New    Target Date  01/10/19       PT LONG TERM GOAL #4   Title  Modified independent household amb. without device (with use of AFO on RLE).    Time  8    Period  Weeks    Status  New    Target Date  01/10/19      PT LONG TERM GOAL #5   Title  Amb. 500' with SPC with SBA for increased community accessibility.    Time  8    Period  Weeks    Status  New    Target Date  01/10/19            Plan - 12/17/18 1735    Clinical Impression Statement  Session focused on use of Lofstrand crutches for assistance with gait and also use of crutches with negotiation of ramp, curb and steps.  Pt able to maintain balance with use of crutches - able to recognize when she is out of sequence and able to correct iwth min cues.     Rehab Potential  Good    Clinical Impairments Affecting Rehab Potential  decr safety awareness with pattern of falls due to poor judgement (walking without AFO, walking without RW, sitting on rolling chair)    PT Frequency  2x / week    PT Duration  8 weeks    PT Treatment/Interventions  Manual techniques;Patient/family education;Therapeutic exercise;Therapeutic activities;Gait training;ADLs/Self Care Home Management;Neuromuscular re-education;Functional mobility training;Passive range of motion;Balance training;Aquatic Therapy;Biofeedback;DME Instruction;Stair training;Orthotic Fit/Training;Dry needling    PT Next Visit Plan  Check STG's! update HEP for LE strengthening and stretching; gait training    PT Home Exercise Plan  seated ab sets and ball squeezes; leg lengthener and leg presses , standing static balance without UE; added runner's stretch and heel cord stretch to HEP    Consulted and Agree with Plan of Care  Patient       Patient will benefit from skilled therapeutic intervention in order to improve the following deficits and impairments:  Pain, Postural dysfunction, Difficulty walking, Decreased strength, Decreased activity tolerance, Abnormal gait, Increased muscle spasms, Decreased endurance,  Decreased balance, Decreased coordination, Decreased knowledge of use of DME, Decreased mobility, Decreased safety awareness, Impaired sensation, Impaired tone  Visit Diagnosis: Other abnormalities of gait and mobility  Unsteadiness on feet  Muscle weakness (generalized)     Problem List Patient Active Problem List   Diagnosis Date Noted  . Lumbar spinal stenosis   . Radicular pain   . Gait disorder   . Difficulty in urination 07/13/2018  . Back pain 07/12/2018  . Hyperlipidemia 04/16/2018  . Generalized anxiety disorder with panic attacks 04/16/2018  . AKI (acute kidney injury) ([place])   . Benign essential HTN   . Depression with anxiety   . Lumbar radiculopathy 03/09/2018  . Paraparesis ([place]) 03/09/2018  . Essential hypertension   . Chronic nonintractable headache   . Leukocytosis   . Acute blood loss anemia   . Postoperative pain   . Generalized weakness   . Spinal stenosis at L4-L5 level 03/04/2018  . Spinal stenosis  of lumbar region 12/28/2015  . Chest pain 04/16/2015    Alda Lea, PT 12/17/2018, 5:41 PM  [facility] [address] [place], [name], [address] Phone: [phone]   Fax:  [phone]  Name: [name] MRN: [id number] Date of Birth: 05/09/1961

## 2018-12-20 ENCOUNTER — Ambulatory Visit: Payer: 59 | Admitting: Physical Therapy

## 2018-12-20 DIAGNOSIS — I10 Essential (primary) hypertension: Secondary | ICD-10-CM | POA: Diagnosis not present

## 2018-12-20 DIAGNOSIS — Z800 Family history of malignant neoplasm of digestive organs: Secondary | ICD-10-CM | POA: Diagnosis not present

## 2018-12-20 DIAGNOSIS — M5416 Radiculopathy, lumbar region: Secondary | ICD-10-CM | POA: Diagnosis not present

## 2018-12-20 DIAGNOSIS — R2681 Unsteadiness on feet: Secondary | ICD-10-CM

## 2018-12-20 DIAGNOSIS — Z90710 Acquired absence of both cervix and uterus: Secondary | ICD-10-CM | POA: Diagnosis not present

## 2018-12-20 DIAGNOSIS — F419 Anxiety disorder, unspecified: Secondary | ICD-10-CM | POA: Diagnosis not present

## 2018-12-20 DIAGNOSIS — R2689 Other abnormalities of gait and mobility: Secondary | ICD-10-CM

## 2018-12-20 DIAGNOSIS — Z9889 Other specified postprocedural states: Secondary | ICD-10-CM | POA: Diagnosis not present

## 2018-12-20 DIAGNOSIS — Z808 Family history of malignant neoplasm of other organs or systems: Secondary | ICD-10-CM | POA: Diagnosis not present

## 2018-12-20 DIAGNOSIS — M6281 Muscle weakness (generalized): Secondary | ICD-10-CM

## 2018-12-20 DIAGNOSIS — Z09 Encounter for follow-up examination after completed treatment for conditions other than malignant neoplasm: Secondary | ICD-10-CM | POA: Diagnosis not present

## 2018-12-20 DIAGNOSIS — M62838 Other muscle spasm: Secondary | ICD-10-CM | POA: Diagnosis not present

## 2018-12-21 ENCOUNTER — Encounter: Payer: Self-pay | Admitting: Physical Therapy

## 2018-12-21 MED FILL — LORazepam 0.5 MG TABS: 0.5 | 30 days supply | Qty: 30 | Fill #1

## 2018-12-21 NOTE — Therapy (Signed)
[facility] [address] [place], [name], [address] Phone: [phone]   Fax:  [phone]  Physical Therapy Treatment  Patient Details  Name: [name] MRN: [id number] Date of Birth: September 20, 1961 Referring Provider (PT): Meredith Staggers, MD   Encounter Date: 12/20/2018  PT End of Session - 12/21/18 2025    Visit Number  23    Number of Visits  28    Date for PT Re-Evaluation  01/10/19    Authorization Type  [facility]; ?worker's comp - email sent on 11-09-18 to insurance specialist asking to confirm which insurance is primary     PT Start Time  1500    PT Stop Time  1548    PT Time Calculation (min)  48 min    Equipment Utilized During Treatment  Other (comment)   flotation belt   Activity Tolerance  Patient tolerated treatment well    Behavior During Therapy  [facility] for tasks assessed/performed       Past Medical History:  Diagnosis Date  . Anxiety   . Back pain    related to spinal stenosis and disc problem, radiates down left buttocks to leg., weakness occ.  [name] Dyspnea   . Headache   . Hyperlipidemia   . Hypertension   . PONV (postoperative nausea and vomiting)   . Vaginal foreign object    "Uses Femring"    Past Surgical History:  Procedure Laterality Date  . ABDOMINAL HYSTERECTOMY    . CARDIAC CATHETERIZATION N/A 04/18/2015   Procedure: Left Heart Cath and Coronary Angiography;  Surgeon: [name], MD;  Location: [place] [facility];  Service: Cardiovascular;  Laterality: N/A;  . FOOT SURGERY Bilateral    [place] "bunion,bone spur, tendon" (1) -6'16, (1)-10'16  . IR EPIDUROGRAPHY  07/21/2018  . LUMBAR LAMINECTOMY/DECOMPRESSION MICRODISCECTOMY Bilateral 12/28/2015   Procedure: MICRO LUMBAR DECOMPRESSION L4 - L5 BILATERALLY;  Surgeon: [name], MD;  [facility];  Service: Orthopedics;  Laterality: Bilateral;  . LUMBAR LAMINECTOMY/DECOMPRESSION MICRODISCECTOMY Bilateral  03/04/2018   Procedure: Revision of Microlumbar Decompression Bilateral Lumbar Four-Five;  Surgeon: [name], MD;  Location: [facility];  Service: Orthopedics;  Laterality: Bilateral;  90 mins  . WISDOM TOOTH EXTRACTION    . WOUND EXPLORATION N/A 03/04/2018   Procedure: EXPLORATION OF LUMBAR DECOMPRESSION WOUND;  Surgeon: [name], MD;  Location: [facility];  Service: Orthopedics;  Laterality: N/A;    There were no vitals filed for this visit.  Subjective Assessment - 12/21/18 2021    Subjective  Pt presents to aquatic therapy at [facility] - using Lofstrand crutches today for assistance with ambulation (not using RW)    Patient Stated Goals  decrease pain in legs and improve LE strength and walking    Currently in Pain?  Yes    Pain Score  4     Pain Location  Back    Pain Orientation  Lower    Pain Descriptors / Indicators  Aching;Discomfort;Dull    Pain Type  Chronic pain    Pain Onset  More than a month ago    Pain Frequency  Constant           Aquatic therapy - pool temp 87.0 degrees  Patient seen for aquatic therapy today.  Treatment took place in water 3.5-4 feet deep depending upon activity.  Pt entered and exited  the pool via step negotiation with use of bil. Hand rails with CGA for safety.   Pt  performed standing hip flexion, abduction and hip extension with knee extended with use of ankle cuff flotation weights for increased resistance with eccentric contraction-  10 reps each leg with UE support on pool edge for assist with balance  Marching in place 10 reps each leg with UE support   Squats x 10 reps with bil. UE support on pool edge  Pt performed water walking forwards, backwards and sideways 50' x 2 reps each direction with UE support on noodle and with flotation belt worn for assist with balance   Pt performed LE ROM exercises in supine - noodle under arms, nekdoodle used for head support and flotation belt used:  Hip abduction/adduction with feet flexed 10 reps,  single knee to chest  10 reps and double knee to chest x 10 reps   Pt requires aquatic therapy for the unweighting provided by the buoyancy of the water and needs the viscosity for strengthening LE's and trunk musc.; current of water provided perturbations for balance challenges and for trunk stabilization                                        PT Short Term Goals - 11/12/18 1942      PT SHORT TERM GOAL #1   Title  Improve TUG score from 52.88 secs to </= 40 secs with use of RW to demo improved mobility and reduced fall risk.    Baseline  52.88 secs - 11-11-18    Time  4    Period  Weeks    Status  New    Target Date  12/12/18      PT SHORT TERM GOAL #2   Title  Pt will increase gait velocity from 1.51 ft/sec to >/= 1.9 ft/sec with use of RW for incr. gait efficiency.    Baseline  21.66 secs = 1.51 ft/sec with RW - 11-11-18    Time  4    Period  Weeks    Status  New    Target Date  12/12/18      PT SHORT TERM GOAL #3   Title  Perform sit to stand with 1 UE support and stabilize independently (without bracing LE's against mat table) x 5 reps to demo improved LE strength.    Time  4    Period  Weeks    Status  New    Target Date  12/12/18      PT SHORT TERM GOAL #4   Title  Amb. 82' with SPC with CGA on flat, even surface.      Baseline  pt using RW for assist. with amb.    Time  4    Period  Weeks    Status  New    Target Date  12/12/18      PT SHORT TERM GOAL #5   Title  Independent in updated HEP including aquatic exercise program.    Time  4    Period  Weeks    Status  New    Target Date  12/12/18        PT Long Term Goals - 11/12/18 1950      PT LONG TERM GOAL #1   Title  Improve TUG score to </= 25 secs with RW for reduced fall risk.    Baseline  52.88 secs with RW on 11-11-18    Time  8  Period  Weeks    Status  New    Target Date  01/10/19      PT LONG TERM GOAL #2   Title  Patient will improve gait velocity to  >/= 2.3 ft/sec with Rt AFO and RW, demonstrating lesser fall risk    Baseline  21.66 secs = 1.51 ft/sec with RW    Time  8    Period  Weeks    Status  New    Target Date  01/10/19      PT LONG TERM GOAL #3   Title  Negotiate 4 steps with 1 hand rail using a step over step sequence.    Time  8    Period  Weeks    Status  New    Target Date  01/10/19      PT LONG TERM GOAL #4   Title  Modified independent household amb. without device (with use of AFO on RLE).    Time  8    Period  Weeks    Status  New    Target Date  01/10/19      PT LONG TERM GOAL #5   Title  Amb. 500' with SPC with SBA for increased community accessibility.    Time  8    Period  Weeks    Status  New    Target Date  01/10/19            Plan - 12/21/18 2026    Clinical Impression Statement  Aquatic therapy session focused on gait training in water with use of flotation belt for joint unloading and on LE strengthening.  Pt had one episode of instability in LE's , requiring mod assist to get to wall for rest period.      Rehab Potential  Good    Clinical Impairments Affecting Rehab Potential  decr safety awareness with pattern of falls due to poor judgement (walking without AFO, walking without RW, sitting on rolling chair)    PT Frequency  2x / week    PT Duration  8 weeks    PT Treatment/Interventions  Manual techniques;Patient/family education;Therapeutic exercise;Therapeutic activities;Gait training;ADLs/Self Care Home Management;Neuromuscular re-education;Functional mobility training;Passive range of motion;Balance training;Aquatic Therapy;Biofeedback;DME Instruction;Stair training;Orthotic Fit/Training;Dry needling    PT Next Visit Plan  Check STG's! update HEP for LE strengthening and stretching; gait training    PT Home Exercise Plan  seated ab sets and ball squeezes; leg lengthener and leg presses , standing static balance without UE; added runner's stretch and heel cord stretch to HEP    Consulted  and Agree with Plan of Care  Patient       Patient will benefit from skilled therapeutic intervention in order to improve the following deficits and impairments:  Pain, Postural dysfunction, Difficulty walking, Decreased strength, Decreased activity tolerance, Abnormal gait, Increased muscle spasms, Decreased endurance, Decreased balance, Decreased coordination, Decreased knowledge of use of DME, Decreased mobility, Decreased safety awareness, Impaired sensation, Impaired tone  Visit Diagnosis: Other abnormalities of gait and mobility  Unsteadiness on feet  Muscle weakness (generalized)     Problem List Patient Active Problem List   Diagnosis Date Noted  . Lumbar spinal stenosis   . Radicular pain   . Gait disorder   . Difficulty in urination 07/13/2018  . Back pain 07/12/2018  . Hyperlipidemia 04/16/2018  . Generalized anxiety disorder with panic attacks 04/16/2018  . AKI (acute kidney injury) ([place])   . Benign essential HTN   . Depression with anxiety   .  Lumbar radiculopathy 03/09/2018  . Paraparesis ([place]) 03/09/2018  . Essential hypertension   . Chronic nonintractable headache   . Leukocytosis   . Acute blood loss anemia   . Postoperative pain   . Generalized weakness   . Spinal stenosis at L4-L5 level 03/04/2018  . Spinal stenosis of lumbar region 12/28/2015  . Chest pain 04/16/2015    Alda Lea, PT 12/21/2018, 8:33 PM  [facility] [address] [place], [name], [address] Phone: [phone]   Fax:  [phone]  Name: [name] MRN: [id number] Date of Birth: 1961-06-12

## 2018-12-23 ENCOUNTER — Ambulatory Visit: Payer: 59 | Admitting: Physical Therapy

## 2018-12-23 DIAGNOSIS — M62838 Other muscle spasm: Secondary | ICD-10-CM | POA: Diagnosis not present

## 2018-12-23 DIAGNOSIS — M6281 Muscle weakness (generalized): Secondary | ICD-10-CM

## 2018-12-23 DIAGNOSIS — Z9889 Other specified postprocedural states: Secondary | ICD-10-CM | POA: Diagnosis not present

## 2018-12-23 DIAGNOSIS — M5416 Radiculopathy, lumbar region: Secondary | ICD-10-CM | POA: Diagnosis not present

## 2018-12-23 DIAGNOSIS — R2689 Other abnormalities of gait and mobility: Secondary | ICD-10-CM

## 2018-12-23 DIAGNOSIS — Z800 Family history of malignant neoplasm of digestive organs: Secondary | ICD-10-CM | POA: Diagnosis not present

## 2018-12-23 DIAGNOSIS — R2681 Unsteadiness on feet: Secondary | ICD-10-CM

## 2018-12-23 DIAGNOSIS — I10 Essential (primary) hypertension: Secondary | ICD-10-CM | POA: Diagnosis not present

## 2018-12-23 DIAGNOSIS — Z90710 Acquired absence of both cervix and uterus: Secondary | ICD-10-CM | POA: Diagnosis not present

## 2018-12-23 DIAGNOSIS — F419 Anxiety disorder, unspecified: Secondary | ICD-10-CM | POA: Diagnosis not present

## 2018-12-23 DIAGNOSIS — Z808 Family history of malignant neoplasm of other organs or systems: Secondary | ICD-10-CM | POA: Diagnosis not present

## 2018-12-23 DIAGNOSIS — Z09 Encounter for follow-up examination after completed treatment for conditions other than malignant neoplasm: Secondary | ICD-10-CM | POA: Diagnosis not present

## 2018-12-23 MED FILL — ATORVASTATIN 20 MG TABLET: 20 | 90 days supply | Qty: 90 | Fill #1 | Status: TO

## 2018-12-24 NOTE — Therapy (Signed)
[facility] [address] [place], [name], [address] Phone: [phone]   Fax:  [phone]  Physical Therapy Treatment  Patient Details  Name: [name] MRN: [id number] Date of Birth: October 05, 1961 Referring Provider (PT): Meredith Staggers, MD   Encounter Date: 12/23/2018  PT End of Session - 12/24/18 1307    Visit Number  24    Number of Visits  28    Date for PT Re-Evaluation  01/10/19    Authorization Type  [facility]; ?worker's comp - email sent on 11-09-18 to insurance specialist asking to confirm which insurance is primary     PT Start Time  1405   pt arrived early - started session early   PT Stop Time  1502    PT Time Calculation (min)  57 min    Equipment Utilized During Treatment  Gait belt    Activity Tolerance  Patient tolerated treatment well    Behavior During Therapy  [facility] for tasks assessed/performed       Past Medical History:  Diagnosis Date  . Anxiety   . Back pain    related to spinal stenosis and disc problem, radiates down left buttocks to leg., weakness occ.  [name] Dyspnea   . Headache   . Hyperlipidemia   . Hypertension   . PONV (postoperative nausea and vomiting)   . Vaginal foreign object    "Uses Femring"    Past Surgical History:  Procedure Laterality Date  . ABDOMINAL HYSTERECTOMY    . CARDIAC CATHETERIZATION N/A 04/18/2015   Procedure: Left Heart Cath and Coronary Angiography;  Surgeon: [name], MD;  Location: [facility];  Service: Cardiovascular;  Laterality: N/A;  . FOOT SURGERY Bilateral    [place] "bunion,bone spur, tendon" (1) -6'16, (1)-10'16  . IR EPIDUROGRAPHY  07/21/2018  . LUMBAR LAMINECTOMY/DECOMPRESSION MICRODISCECTOMY Bilateral 12/28/2015   Procedure: MICRO LUMBAR DECOMPRESSION L4 - L5 BILATERALLY;  Surgeon: [name], MD;  Location: [facility];  Service: Orthopedics;  Laterality: Bilateral;  . LUMBAR LAMINECTOMY/DECOMPRESSION  MICRODISCECTOMY Bilateral 03/04/2018   Procedure: Revision of Microlumbar Decompression Bilateral Lumbar Four-Five;  Surgeon: [name], MD;  Location: [facility];  Service: Orthopedics;  Laterality: Bilateral;  90 mins  . WISDOM TOOTH EXTRACTION    . WOUND EXPLORATION N/A 03/04/2018   Procedure: EXPLORATION OF LUMBAR DECOMPRESSION WOUND;  Surgeon: [name], MD;  Location: [facility];  Service: Orthopedics;  Laterality: N/A;    There were no vitals filed for this visit.  Subjective Assessment - 12/24/18 1300    Subjective  Pt presents to PT using Lofstrand crutches; states her legs started feeling much stronger this morning    Pertinent History  decompression and laminectomy on 12/28/15 at L4-L5  with redo decompression surgery on May 9,2019, post-op paraparesis with surgery to remove small hematoma; 07/12/18 fall at work (involved rolling chair) with hospital admission, transfer to [facility] with d/c home 08/03/18; HTN, Bil foot surgery     Patient Stated Goals  decrease pain in legs and improve LE strength and walking    Currently in Pain?  Yes    Pain Score  4     Pain Location  Back    Pain Orientation  Lower    Pain Descriptors / Indicators  Aching;Discomfort;Dull    Pain Type  Chronic pain    Pain Onset  More than a month ago    Pain Frequency  Constant  [facility] Adult PT Treatment/Exercise - 12/24/18 0001      Ambulation/Gait   Ambulation/Gait  Yes    Ambulation/Gait Assistance  4: Min guard    Ambulation/Gait Assistance Details  Rt AFO donned    Ambulation Distance (Feet)  125 Feet    Assistive device  Lofstrands    Gait Pattern  Decreased dorsiflexion - right;Decreased step length - right;Right steppage    Ambulation Surface  Level;Indoor    Stairs  Yes    Stairs Assistance  4: Min guard    Stair Management Technique  Step to pattern;Forwards;Other (comment)   with Lofstrand crutches   Number of Stairs  4   2 reps = 8 steps total   Height of Stairs   6      Neuro Re-ed    Neuro Re-ed Details   Pt performed tall kneeling activity on mat - blue ball in front for UE support; pt performed squats x 5 reps; moved each leg out/in and up/back x 5 reps each leg with min assist with bil. UE support on ball      Knee/Hip Exercises: Aerobic   Recumbent Bike  SciFit level 2.0 x 30 secs due to fatigue   this exercise was done at end of session     Knee/Hip Exercises: Machines for Strengthening   Cybex Leg Press  30# bil. LE's 3 sets 10 reps      Knee/Hip Exercises: Standing   Forward Step Up  Right;Left;1 set;Step Height: 6";Limitations;Other (comment)   quad weakness; 3 reps each leg     Knee/Hip Exercises: Seated   Hamstring Curl  Strengthening;Right;Left;1 set;10 reps   with yellow theraband              PT Short Term Goals - 11/12/18 1942      PT SHORT TERM GOAL #1   Title  Improve TUG score from 52.88 secs to </= 40 secs with use of RW to demo improved mobility and reduced fall risk.    Baseline  52.88 secs - 11-11-18    Time  4    Period  Weeks    Status  New    Target Date  12/12/18      PT SHORT TERM GOAL #2   Title  Pt will increase gait velocity from 1.51 ft/sec to >/= 1.9 ft/sec with use of RW for incr. gait efficiency.    Baseline  21.66 secs = 1.51 ft/sec with RW - 11-11-18    Time  4    Period  Weeks    Status  New    Target Date  12/12/18      PT SHORT TERM GOAL #3   Title  Perform sit to stand with 1 UE support and stabilize independently (without bracing LE's against mat table) x 5 reps to demo improved LE strength.    Time  4    Period  Weeks    Status  New    Target Date  12/12/18      PT SHORT TERM GOAL #4   Title  Amb. 37' with SPC with CGA on flat, even surface.      Baseline  pt using RW for assist. with amb.    Time  4    Period  Weeks    Status  New    Target Date  12/12/18      PT SHORT TERM GOAL #5   Title  Independent in updated HEP including aquatic exercise program.  Time  4     Period  Weeks    Status  New    Target Date  12/12/18        PT Long Term Goals - 11/12/18 1950      PT LONG TERM GOAL #1   Title  Improve TUG score to </= 25 secs with RW for reduced fall risk.    Baseline  52.88 secs with RW on 11-11-18    Time  8    Period  Weeks    Status  New    Target Date  01/10/19      PT LONG TERM GOAL #2   Title  Patient will improve gait velocity to >/= 2.3 ft/sec with Rt AFO and RW, demonstrating lesser fall risk    Baseline  21.66 secs = 1.51 ft/sec with RW    Time  8    Period  Weeks    Status  New    Target Date  01/10/19      PT LONG TERM GOAL #3   Title  Negotiate 4 steps with 1 hand rail using a step over step sequence.    Time  8    Period  Weeks    Status  New    Target Date  01/10/19      PT LONG TERM GOAL #4   Title  Modified independent household amb. without device (with use of AFO on RLE).    Time  8    Period  Weeks    Status  New    Target Date  01/10/19      PT LONG TERM GOAL #5   Title  Amb. 500' with SPC with SBA for increased community accessibility.    Time  8    Period  Weeks    Status  New    Target Date  01/10/19            Plan - 12/24/18 1307    Clinical Impression Statement  Pt performed activities in tall kneeling for first time today - tolerated well with min. c/o increased back pain with activities.  Gait is improving with Lofstrand crutches.      Rehab Potential  Good    Clinical Impairments Affecting Rehab Potential  decr safety awareness with pattern of falls due to poor judgement (walking without AFO, walking without RW, sitting on rolling chair)    PT Frequency  2x / week    PT Duration  8 weeks    PT Treatment/Interventions  Manual techniques;Patient/family education;Therapeutic exercise;Therapeutic activities;Gait training;ADLs/Self Care Home Management;Neuromuscular re-education;Functional mobility training;Passive range of motion;Balance training;Aquatic Therapy;Biofeedback;DME  Instruction;Stair training;Orthotic Fit/Training;Dry needling    PT Next Visit Plan  Check STG's! update HEP for LE strengthening and stretching; gait training    PT Home Exercise Plan  seated ab sets and ball squeezes; leg lengthener and leg presses , standing static balance without UE; added runner's stretch and heel cord stretch to HEP    Consulted and Agree with Plan of Care  Patient       Patient will benefit from skilled therapeutic intervention in order to improve the following deficits and impairments:  Pain, Postural dysfunction, Difficulty walking, Decreased strength, Decreased activity tolerance, Abnormal gait, Increased muscle spasms, Decreased endurance, Decreased balance, Decreased coordination, Decreased knowledge of use of DME, Decreased mobility, Decreased safety awareness, Impaired sensation, Impaired tone  Visit Diagnosis: Other abnormalities of gait and mobility  Unsteadiness on feet  Muscle weakness (generalized)  Problem List Patient Active Problem List   Diagnosis Date Noted  . Lumbar spinal stenosis   . Radicular pain   . Gait disorder   . Difficulty in urination 07/13/2018  . Back pain 07/12/2018  . Hyperlipidemia 04/16/2018  . Generalized anxiety disorder with panic attacks 04/16/2018  . AKI (acute kidney injury) ([place])   . Benign essential HTN   . Depression with anxiety   . Lumbar radiculopathy 03/09/2018  . Paraparesis ([place]) 03/09/2018  . Essential hypertension   . Chronic nonintractable headache   . Leukocytosis   . Acute blood loss anemia   . Postoperative pain   . Generalized weakness   . Spinal stenosis at L4-L5 level 03/04/2018  . Spinal stenosis of lumbar region 12/28/2015  . Chest pain 04/16/2015    Alda Lea, PT 12/24/2018, 1:11 PM  [facility] [address] [place], [name], [address] Phone: [phone]   Fax:  [phone]  Name: ASANTI CRAIGO MRN: [id number] Date of Birth: 1961-07-07

## 2018-12-27 ENCOUNTER — Ambulatory Visit: Payer: Self-pay | Attending: Specialist | Admitting: Physical Therapy

## 2018-12-27 DIAGNOSIS — R2681 Unsteadiness on feet: Secondary | ICD-10-CM | POA: Insufficient documentation

## 2018-12-27 DIAGNOSIS — R2689 Other abnormalities of gait and mobility: Secondary | ICD-10-CM | POA: Insufficient documentation

## 2018-12-27 DIAGNOSIS — M6281 Muscle weakness (generalized): Secondary | ICD-10-CM | POA: Insufficient documentation

## 2018-12-28 NOTE — Therapy (Signed)
[facility] [address] [place], [name], [address] Phone: [phone]   Fax:  [phone]  Physical Therapy Treatment  Patient Details  Name: [name] MRN: [id number] Date of Birth: 1961/10/15 Referring Provider (PT): Meredith Staggers, MD   Encounter Date: 12/27/2018  PT End of Session - 12/28/18 2036    Visit Number  25    Number of Visits  28    Date for PT Re-Evaluation  01/10/19    Authorization Type  [facility]; ?worker's comp - email sent on 11-09-18 to insurance specialist asking to confirm which insurance is primary     PT Start Time  1505    PT Stop Time  1545    PT Time Calculation (min)  40 min    Equipment Utilized During Treatment  Other (comment)   flotation belt used   Activity Tolerance  Patient tolerated treatment well    Behavior During Therapy  [facility] for tasks assessed/performed       Past Medical History:  Diagnosis Date  . Anxiety   . Back pain    related to spinal stenosis and disc problem, radiates down left buttocks to leg., weakness occ.  [name] Dyspnea   . Headache   . Hyperlipidemia   . Hypertension   . PONV (postoperative nausea and vomiting)   . Vaginal foreign object    "Uses Femring"    Past Surgical History:  Procedure Laterality Date  . ABDOMINAL HYSTERECTOMY    . CARDIAC CATHETERIZATION N/A 04/18/2015   Procedure: Left Heart Cath and Coronary Angiography;  Surgeon: [name], MD;  Location: [facility];  Service: Cardiovascular;  Laterality: N/A;  . FOOT SURGERY Bilateral    [place] "bunion,bone spur, tendon" (1) -6'16, (1)-10'16  . IR EPIDUROGRAPHY  07/21/2018  . LUMBAR LAMINECTOMY/DECOMPRESSION MICRODISCECTOMY Bilateral 12/28/2015   Procedure: MICRO LUMBAR DECOMPRESSION L4 - L5 BILATERALLY;  Surgeon: [name], MD;  Location: [facility];  Service: Orthopedics;  Laterality: Bilateral;  . LUMBAR LAMINECTOMY/DECOMPRESSION MICRODISCECTOMY  Bilateral 03/04/2018   Procedure: Revision of Microlumbar Decompression Bilateral Lumbar Four-Five;  Surgeon: [name], MD;  Location: [facility];  Service: Orthopedics;  Laterality: Bilateral;  90 mins  . WISDOM TOOTH EXTRACTION    . WOUND EXPLORATION N/A 03/04/2018   Procedure: EXPLORATION OF LUMBAR DECOMPRESSION WOUND;  Surgeon: [name], MD;  Location: [facility];  Service: Orthopedics;  Laterality: N/A;    There were no vitals filed for this visit.  Subjective Assessment - 12/28/18 2033    Subjective  Pt presents for aquatic therapy at [facility] - using RW today for more stability after completion of PT session due to fatigue experienced at end of session    Patient is accompained by:  Family member    Pertinent History  decompression and laminectomy on 12/28/15 at L4-L5  with redo decompression surgery on May 9,2019, post-op paraparesis with surgery to remove small hematoma; 07/12/18 fall at work (involved rolling chair) with hospital admission, transfer to [facility] with d/c home 08/03/18; HTN, Bil foot surgery     Patient Stated Goals  decrease pain in legs and improve LE strength and walking    Currently in Pain?  Yes    Pain Score  5     Pain Location  Back    Pain Orientation  Lower    Pain Descriptors / Indicators  Aching;Discomfort;Dull    Pain Onset  More than a month ago    Pain Frequency  Constant    Multiple Pain Sites  No       Aquatic therapy - pool temp. 87.0 degrees  Patient seen for aquatic therapy today.  Treatment took place in water 3.5-4 feet deep depending upon activity.  Pt entered and exited  the pool via step negotiation with bil. Hand rails with CGA for safety. Pt performed standing hip flexion, abduction and hip extension with knee extended 10 reps each leg with UE support on pool edge for assist with balance  Marching in place 10 reps each leg with UE support   Squats x 10 reps with bil. UE support on pool edge  Pt performed water walking forwards, backwards and  sideways  2m -  2 reps forwards and sideways:  Approx. 42' backwards with UE support on noodle and with flotation belt worn for assist with balance  Pt requires aquatic therapy for the unweighting provided by the buoyancy of the water and needs the viscosity for strengthening LE's and trunk musc.; current of water provided perturbations for balance challenges and for trunk stabilization                                      PT Short Term Goals - 11/12/18 1942      PT SHORT TERM GOAL #1   Title  Improve TUG score from 52.88 secs to </= 40 secs with use of RW to demo improved mobility and reduced fall risk.    Baseline  52.88 secs - 11-11-18    Time  4    Period  Weeks    Status  New    Target Date  12/12/18      PT SHORT TERM GOAL #2   Title  Pt will increase gait velocity from 1.51 ft/sec to >/= 1.9 ft/sec with use of RW for incr. gait efficiency.    Baseline  21.66 secs = 1.51 ft/sec with RW - 11-11-18    Time  4    Period  Weeks    Status  New    Target Date  12/12/18      PT SHORT TERM GOAL #3   Title  Perform sit to stand with 1 UE support and stabilize independently (without bracing LE's against mat table) x 5 reps to demo improved LE strength.    Time  4    Period  Weeks    Status  New    Target Date  12/12/18      PT SHORT TERM GOAL #4   Title  Amb. 34' with SPC with CGA on flat, even surface.      Baseline  pt using RW for assist. with amb.    Time  4    Period  Weeks    Status  New    Target Date  12/12/18      PT SHORT TERM GOAL #5   Title  Independent in updated HEP including aquatic exercise program.    Time  4    Period  Weeks    Status  New    Target Date  12/12/18        PT Long Term Goals - 11/12/18 1950      PT LONG TERM GOAL #1   Title  Improve TUG score to </= 25 secs with RW for reduced fall risk.    Baseline  52.88 secs with RW on 11-11-18  Time  8    Period  Weeks    Status  New    Target Date  01/10/19       PT LONG TERM GOAL #2   Title  Patient will improve gait velocity to >/= 2.3 ft/sec with Rt AFO and RW, demonstrating lesser fall risk    Baseline  21.66 secs = 1.51 ft/sec with RW    Time  8    Period  Weeks    Status  New    Target Date  01/10/19      PT LONG TERM GOAL #3   Title  Negotiate 4 steps with 1 hand rail using a step over step sequence.    Time  8    Period  Weeks    Status  New    Target Date  01/10/19      PT LONG TERM GOAL #4   Title  Modified independent household amb. without device (with use of AFO on RLE).    Time  8    Period  Weeks    Status  New    Target Date  01/10/19      PT LONG TERM GOAL #5   Title  Amb. 500' with SPC with SBA for increased community accessibility.    Time  8    Period  Weeks    Status  New    Target Date  01/10/19            Plan - 12/28/18 2037    Clinical Impression Statement  Pt tolerated aquatic therapy session well; no aquatic cuff weights used today due to c/o incr. back pain with pt reporting effect of epidural is starting to subside;  legs did not tremor or become weak today as they did in aquatic therapy session last week    Rehab Potential  Good    Clinical Impairments Affecting Rehab Potential  decr safety awareness with pattern of falls due to poor judgement (walking without AFO, walking without RW, sitting on rolling chair)    PT Frequency  2x / week    PT Duration  8 weeks    PT Treatment/Interventions  Manual techniques;Patient/family education;Therapeutic exercise;Therapeutic activities;Gait training;ADLs/Self Care Home Management;Neuromuscular re-education;Functional mobility training;Passive range of motion;Balance training;Aquatic Therapy;Biofeedback;DME Instruction;Stair training;Orthotic Fit/Training;Dry needling    PT Next Visit Plan  Check STG's! update HEP for LE strengthening and stretching; gait training    PT Home Exercise Plan  seated ab sets and ball squeezes; leg lengthener and leg presses ,  standing static balance without UE; added runner's stretch and heel cord stretch to HEP    Consulted and Agree with Plan of Care  Patient       Patient will benefit from skilled therapeutic intervention in order to improve the following deficits and impairments:  Pain, Postural dysfunction, Difficulty walking, Decreased strength, Decreased activity tolerance, Abnormal gait, Increased muscle spasms, Decreased endurance, Decreased balance, Decreased coordination, Decreased knowledge of use of DME, Decreased mobility, Decreased safety awareness, Impaired sensation, Impaired tone  Visit Diagnosis: Other abnormalities of gait and mobility  Unsteadiness on feet  Muscle weakness (generalized)     Problem List Patient Active Problem List   Diagnosis Date Noted  . Lumbar spinal stenosis   . Radicular pain   . Gait disorder   . Difficulty in urination 07/13/2018  . Back pain 07/12/2018  . Hyperlipidemia 04/16/2018  . Generalized anxiety disorder with panic attacks 04/16/2018  . AKI (acute kidney injury) ([place])   .  Benign essential HTN   . Depression with anxiety   . Lumbar radiculopathy 03/09/2018  . Paraparesis ([place]) 03/09/2018  . Essential hypertension   . Chronic nonintractable headache   . Leukocytosis   . Acute blood loss anemia   . Postoperative pain   . Generalized weakness   . Spinal stenosis at L4-L5 level 03/04/2018  . Spinal stenosis of lumbar region 12/28/2015  . Chest pain 04/16/2015    Alda Lea, PT 12/28/2018, 8:42 PM  [facility] [address] [place], [name], [address] Phone: [phone]   Fax:  [phone]  Name: [name] MRN: [id number] Date of Birth: 11-30-1960

## 2018-12-30 ENCOUNTER — Ambulatory Visit: Payer: Self-pay | Admitting: Physical Therapy

## 2019-01-03 ENCOUNTER — Ambulatory Visit: Payer: Self-pay | Admitting: Physical Therapy

## 2019-01-03 DIAGNOSIS — M6281 Muscle weakness (generalized): Secondary | ICD-10-CM

## 2019-01-03 DIAGNOSIS — R2681 Unsteadiness on feet: Secondary | ICD-10-CM

## 2019-01-03 DIAGNOSIS — R2689 Other abnormalities of gait and mobility: Secondary | ICD-10-CM

## 2019-01-04 ENCOUNTER — Ambulatory Visit: Payer: Self-pay | Admitting: Physical Therapy

## 2019-01-04 NOTE — Therapy (Signed)
[facility] [address] [place], [name], [address] Phone: [phone]   Fax:  [phone]  Physical Therapy Treatment  Patient Details  Name: KAMORAH NEVILS MRN: [id number] Date of Birth: [birth year] Referring Provider (PT): Meredith Staggers, MD   Encounter Date: 01/03/2019  PT End of Session - 01/04/19 2100    Visit Number  26    Number of Visits  28    Date for PT Re-Evaluation  01/10/19    Authorization Type  [facility]; ?worker's comp - email sent on 11-09-18 to insurance specialist asking to confirm which insurance is primary     PT Start Time  1500    PT Stop Time  1545    PT Time Calculation (min)  45 min    Equipment Utilized During Treatment  Other (comment)   flotation belt used   Activity Tolerance  Patient tolerated treatment well    Behavior During Therapy  [facility] for tasks assessed/performed       Past Medical History:  Diagnosis Date  . Anxiety   . Back pain    related to spinal stenosis and disc problem, radiates down left buttocks to leg., weakness occ.  [name] Dyspnea   . Headache   . Hyperlipidemia   . Hypertension   . PONV (postoperative nausea and vomiting)   . Vaginal foreign object    "Uses Femring"    Past Surgical History:  Procedure Laterality Date  . ABDOMINAL HYSTERECTOMY    . CARDIAC CATHETERIZATION N/A 04/18/2015   Procedure: Left Heart Cath and Coronary Angiography;  Surgeon: [name], MD;  Location: [place] [facility];  Service: Cardiovascular;  Laterality: N/A;  . FOOT SURGERY Bilateral    [place] "bunion,bone spur, tendon" (1) -6'16, (1)-10'16  . IR EPIDUROGRAPHY  07/21/2018  . LUMBAR LAMINECTOMY/DECOMPRESSION MICRODISCECTOMY Bilateral 12/28/2015   Procedure: MICRO LUMBAR DECOMPRESSION L4 - L5 BILATERALLY;  Surgeon: [name], MD;  Location: [facility];  Service: Orthopedics;  Laterality: Bilateral;  . LUMBAR LAMINECTOMY/DECOMPRESSION MICRODISCECTOMY  Bilateral 03/04/2018   Procedure: Revision of Microlumbar Decompression Bilateral Lumbar Four-Five;  Surgeon: [name], MD;  Location: [facility];  Service: Orthopedics;  Laterality: Bilateral;  90 mins  . WISDOM TOOTH EXTRACTION    . WOUND EXPLORATION N/A 03/04/2018   Procedure: EXPLORATION OF LUMBAR DECOMPRESSION WOUND;  Surgeon: [name], MD;  Location: [facility];  Service: Orthopedics;  Laterality: N/A;    There were no vitals filed for this visit.  Subjective Assessment - 01/04/19 2057    Subjective  Pt states she is tired - has not slept much due to husband having had knee surgery; says back is hurting more because she has been sitting alot    Pertinent History  decompression and laminectomy on 12/28/15 at L4-L5  with redo decompression surgery on May 9,2019, post-op paraparesis with surgery to remove small hematoma; 07/12/18 fall at work (involved rolling chair) with hospital admission, transfer to [facility] [place] with d/c home 08/03/18; HTN, Bil foot surgery     Patient Stated Goals  decrease pain in legs and improve LE strength and walking    Currently in Pain?  Yes    Pain Score  7     Pain Location  Back    Pain Orientation  Lower    Pain Descriptors / Indicators  Aching;Discomfort;Dull    Pain Type  Chronic pain    Pain Onset  More than a month ago    Pain Frequency  Intermittent          Aquatic therapy - pool temp 87.4 degrees  Patient seen for aquatic therapy today.  Treatment took place in water 3.5-4 feet deep depending upon activity.  Pt entered and exited  the pool via step negotiation with use of bil. Hand rails with CGA for safety.   Pt performed standing hip flexion, abduction and hip extension with knee extended -  10 reps each leg with UE support on pool edge for assist with balance  Marching in place 10 reps each leg with UE support   Squats x 10 reps with bil. UE support on pool edge  Pt performed water walking forwards, backwards and sideways 50' x 2 reps each  direction with UE support on noodle and with flotation belt worn for assist with balance  Supine exercises -  Pt performed LE ROM exercises in supine - noodle under arms, nekdoodle used for head support and flotation belt used:  Hip abduction/adduction with feet flexed 10 reps, single knee to chest  10 reps and double knee to chest x 10 reps   Pt requires aquatic therapy for the unweighting provided by the buoyancy of the water and needs the viscosity for strengthening LE's and trunk musc.; current of water provided perturbations for balance challenges and for trunk stabilization                                      PT Short Term Goals - 11/12/18 1942      PT SHORT TERM GOAL #1   Title  Improve TUG score from 52.88 secs to </= 40 secs with use of RW to demo improved mobility and reduced fall risk.    Baseline  52.88 secs - 11-11-18    Time  4    Period  Weeks    Status  New    Target Date  12/12/18      PT SHORT TERM GOAL #2   Title  Pt will increase gait velocity from 1.51 ft/sec to >/= 1.9 ft/sec with use of RW for incr. gait efficiency.    Baseline  21.66 secs = 1.51 ft/sec with RW - 11-11-18    Time  4    Period  Weeks    Status  New    Target Date  12/12/18      PT SHORT TERM GOAL #3   Title  Perform sit to stand with 1 UE support and stabilize independently (without bracing LE's against mat table) x 5 reps to demo improved LE strength.    Time  4    Period  Weeks    Status  New    Target Date  12/12/18      PT SHORT TERM GOAL #4   Title  Amb. 33' with SPC with CGA on flat, even surface.      Baseline  pt using RW for assist. with amb.    Time  4    Period  Weeks    Status  New    Target Date  12/12/18      PT SHORT TERM GOAL #5   Title  Independent in updated HEP including aquatic exercise program.    Time  4    Period  Weeks    Status  New    Target Date  12/12/18        PT Long Term Goals -  11/12/18 1950      PT LONG  TERM GOAL #1   Title  Improve TUG score to </= 25 secs with RW for reduced fall risk.    Baseline  52.88 secs with RW on 11-11-18    Time  8    Period  Weeks    Status  New    Target Date  01/10/19      PT LONG TERM GOAL #2   Title  Patient will improve gait velocity to >/= 2.3 ft/sec with Rt AFO and RW, demonstrating lesser fall risk    Baseline  21.66 secs = 1.51 ft/sec with RW    Time  8    Period  Weeks    Status  New    Target Date  01/10/19      PT LONG TERM GOAL #3   Title  Negotiate 4 steps with 1 hand rail using a step over step sequence.    Time  8    Period  Weeks    Status  New    Target Date  01/10/19      PT LONG TERM GOAL #4   Title  Modified independent household amb. without device (with use of AFO on RLE).    Time  8    Period  Weeks    Status  New    Target Date  01/10/19      PT LONG TERM GOAL #5   Title  Amb. 500' with SPC with SBA for increased community accessibility.    Time  8    Period  Weeks    Status  New    Target Date  01/10/19            Plan - 01/04/19 2101    Clinical Impression Statement  Pt c/o back pain (spasm) x 1 occurrence while performing supine LE strengthening exercises.  Legs did not appear to be as strong as they have been in previous sessions - pt attributes weakness to excessive sitting due to husband's surgery last Thursday.  Pt continues to have occurrences of legs becoming suddenly weak and trembly.      Rehab Potential  Good    Clinical Impairments Affecting Rehab Potential  decr safety awareness with pattern of falls due to poor judgement (walking without AFO, walking without RW, sitting on rolling chair)    PT Frequency  2x / week    PT Duration  8 weeks    PT Treatment/Interventions  Manual techniques;Patient/family education;Therapeutic exercise;Therapeutic activities;Gait training;ADLs/Self Care Home Management;Neuromuscular re-education;Functional mobility training;Passive range of motion;Balance training;Aquatic  Therapy;Biofeedback;DME Instruction;Stair training;Orthotic Fit/Training;Dry needling    PT Next Visit Plan  Check STG's! update HEP for LE strengthening and stretching; gait training    Consulted and Agree with Plan of Care  Patient       Patient will benefit from skilled therapeutic intervention in order to improve the following deficits and impairments:  Pain, Postural dysfunction, Difficulty walking, Decreased strength, Decreased activity tolerance, Abnormal gait, Increased muscle spasms, Decreased endurance, Decreased balance, Decreased coordination, Decreased knowledge of use of DME, Decreased mobility, Decreased safety awareness, Impaired sensation, Impaired tone  Visit Diagnosis: Other abnormalities of gait and mobility  Unsteadiness on feet  Muscle weakness (generalized)     Problem List Patient Active Problem List   Diagnosis Date Noted  . Lumbar spinal stenosis   . Radicular pain   . Gait disorder   . Difficulty in urination 07/13/2018  . Back pain 07/12/2018  .  Hyperlipidemia 04/16/2018  . Generalized anxiety disorder with panic attacks 04/16/2018  . AKI (acute kidney injury) ([place])   . Benign essential HTN   . Depression with anxiety   . Lumbar radiculopathy 03/09/2018  . Paraparesis ([place]) 03/09/2018  . Essential hypertension   . Chronic nonintractable headache   . Leukocytosis   . Acute blood loss anemia   . Postoperative pain   . Generalized weakness   . Spinal stenosis at L4-L5 level 03/04/2018  . Spinal stenosis of lumbar region 12/28/2015  . Chest pain 04/16/2015    Alda Lea, PT 01/04/2019, 9:07 PM  [facility] [address] [place], [name], [address] Phone: [phone]   Fax:  [phone]  Name: YAMARIS CUMMINGS MRN: [id number] Date of Birth: Jun 23, 1961

## 2019-01-06 ENCOUNTER — Ambulatory Visit: Payer: Self-pay | Admitting: Physical Therapy

## 2019-01-06 DIAGNOSIS — R2681 Unsteadiness on feet: Secondary | ICD-10-CM

## 2019-01-06 DIAGNOSIS — R2689 Other abnormalities of gait and mobility: Secondary | ICD-10-CM

## 2019-01-06 DIAGNOSIS — M6281 Muscle weakness (generalized): Secondary | ICD-10-CM

## 2019-01-08 NOTE — Therapy (Signed)
[facility] [address] [place], [name], [address] Phone: [phone]   Fax:  [phone]  Physical Therapy Treatment  Patient Details  Name: [name] MRN: [id number] Date of Birth: Jan 03, 1961 Referring Provider (PT): Meredith Staggers, MD   Encounter Date: 01/06/2019  PT End of Session - 01/08/19 2015    Visit Number  27    Number of Visits  28    Date for PT Re-Evaluation  01/10/19    Authorization Type  [facility]; ?worker's comp - email sent on 11-09-18 to insurance specialist asking to confirm which insurance is primary     PT Start Time  1104    PT Stop Time  1150    PT Time Calculation (min)  46 min    Equipment Utilized During Treatment  Gait belt    Activity Tolerance  Patient tolerated treatment well    Behavior During Therapy  [facility] for tasks assessed/performed       Past Medical History:  Diagnosis Date  . Anxiety   . Back pain    related to spinal stenosis and disc problem, radiates down left buttocks to leg., weakness occ.  [name] Dyspnea   . Headache   . Hyperlipidemia   . Hypertension   . PONV (postoperative nausea and vomiting)   . Vaginal foreign object    "Uses Femring"    Past Surgical History:  Procedure Laterality Date  . ABDOMINAL HYSTERECTOMY    . CARDIAC CATHETERIZATION N/A 04/18/2015   Procedure: Left Heart Cath and Coronary Angiography;  Surgeon: [name], MD;  Location: [facility];  Service: Cardiovascular;  Laterality: N/A;  . FOOT SURGERY Bilateral    [place] "bunion,bone spur, tendon" (1) -6'16, (1)-10'16  . IR EPIDUROGRAPHY  07/21/2018  . LUMBAR LAMINECTOMY/DECOMPRESSION MICRODISCECTOMY Bilateral 12/28/2015   Procedure: MICRO LUMBAR DECOMPRESSION L4 - L5 BILATERALLY;  Surgeon: [name], MD;  [facility];  Service: Orthopedics;  Laterality: Bilateral;  . LUMBAR LAMINECTOMY/DECOMPRESSION MICRODISCECTOMY Bilateral 03/04/2018   Procedure:  Revision of Microlumbar Decompression Bilateral Lumbar Four-Five;  Surgeon: [name], MD;  Location: [facility];  Service: Orthopedics;  Laterality: Bilateral;  90 mins  . WISDOM TOOTH EXTRACTION    . WOUND EXPLORATION N/A 03/04/2018   Procedure: EXPLORATION OF LUMBAR DECOMPRESSION WOUND;  Surgeon: [name], MD;  Location: [facility];  Service: Orthopedics;  Laterality: N/A;    There were no vitals filed for this visit.  Subjective Assessment - 01/08/19 2010    Subjective  Pt states she is feeling better - is getting more rest; is walking well with the crutches and says she does really well with using only 1 crutch    Pertinent History  decompression and laminectomy on 12/28/15 at L4-L5  with redo decompression surgery on May 9,2019, post-op paraparesis with surgery to remove small hematoma; 07/12/18 fall at work (involved rolling chair) with hospital admission, transfer to [place] with d/c home 08/03/18; HTN, Bil foot surgery     Patient Stated Goals  decrease pain in legs and improve LE strength and walking    Currently in Pain?  Yes    Pain Score  5     Pain Location  Back    Pain Orientation  Lower    Pain Descriptors / Indicators  Aching;Discomfort;Dull    Pain Type  Chronic pain    Pain Onset  More than a month ago    Pain Frequency  Intermittent  [facility] Adult PT Treatment/Exercise - 01/08/19 0001      Ambulation/Gait   Ambulation/Gait  Yes    Ambulation/Gait Assistance  4: Min guard    Ambulation/Gait Assistance Details  Rt AFO donned    Ambulation Distance (Feet)  100 Feet    Assistive device  Lofstrands    Gait Pattern  Decreased dorsiflexion - right;Decreased step length - right;Right steppage    Ambulation Surface  Level;Indoor      Knee/Hip Exercises: Stretches   Active Hamstring Stretch  Both;1 rep;30 seconds   runner's stretch     Knee/Hip Exercises: Machines for Strengthening   Cybex Leg Press  40# bil. LE's 3 sets 10 reps       Knee/Hip Exercises: Standing   Forward Step Up  Right;Left;1 set;10 reps;Hand Hold: 2;Step Height: 6"       Tap ups to 6" step with UE support on hand rails 10 reps each foot         PT Short Term Goals - 11/12/18 1942      PT SHORT TERM GOAL #1   Title  Improve TUG score from 52.88 secs to </= 40 secs with use of RW to demo improved mobility and reduced fall risk.    Baseline  52.88 secs - 11-11-18    Time  4    Period  Weeks    Status  New    Target Date  12/12/18      PT SHORT TERM GOAL #2   Title  Pt will increase gait velocity from 1.51 ft/sec to >/= 1.9 ft/sec with use of RW for incr. gait efficiency.    Baseline  21.66 secs = 1.51 ft/sec with RW - 11-11-18    Time  4    Period  Weeks    Status  New    Target Date  12/12/18      PT SHORT TERM GOAL #3   Title  Perform sit to stand with 1 UE support and stabilize independently (without bracing LE's against mat table) x 5 reps to demo improved LE strength.    Time  4    Period  Weeks    Status  New    Target Date  12/12/18      PT SHORT TERM GOAL #4   Title  Amb. 21' with SPC with CGA on flat, even surface.      Baseline  pt using RW for assist. with amb.    Time  4    Period  Weeks    Status  New    Target Date  12/12/18      PT SHORT TERM GOAL #5   Title  [facility] in updated HEP including aquatic exercise program.    Time  4    Period  Weeks    Status  New    Target Date  12/12/18        PT Long Term Goals - 11/12/18 1950      PT LONG TERM GOAL #1   Title  Improve TUG score to </= 25 secs with RW for reduced fall risk.    Baseline  52.88 secs with RW on 11-11-18    Time  8    Period  Weeks    Status  New    Target Date  01/10/19      PT LONG TERM GOAL #2   Title  Patient will improve gait velocity to >/= 2.3 ft/sec with Rt AFO and RW, demonstrating lesser  fall risk    Baseline  21.66 secs = 1.51 ft/sec with RW    Time  8    Period  Weeks    Status  New    Target Date  01/10/19      PT  LONG TERM GOAL #3   Title  Negotiate 4 steps with 1 hand rail using a step over step sequence.    Time  8    Period  Weeks    Status  New    Target Date  01/10/19      PT LONG TERM GOAL #4   Title  Modified independent household amb. without device (with use of AFO on RLE).    Time  8    Period  Weeks    Status  New    Target Date  01/10/19      PT LONG TERM GOAL #5   Title  Amb. 500' with SPC with SBA for increased community accessibility.    Time  8    Period  Weeks    Status  New    Target Date  01/10/19            Plan - 01/08/19 2017    Clinical Impression Statement  Pt is progressing well towards goals; gait improved today with pt using only 1 Lofstrand crutch to amb. in clinic gym.  RLE remains weaker than LLE with pt continuing to need use of Rt AFO on RLE    Rehab Potential  Good    PT Frequency  2x / week    PT Duration  8 weeks    PT Treatment/Interventions  Manual techniques;Patient/family education;Therapeutic exercise;Therapeutic activities;Gait training;ADLs/Self Care Home Management;Neuromuscular re-education;Functional mobility training;Passive range of motion;Balance training;Aquatic Therapy;Biofeedback;DME Instruction;Stair training;Orthotic Fit/Training;Dry needling    PT Next Visit Plan  Check STG's! update HEP for LE strengthening and stretching; gait training    Consulted and Agree with Plan of Care  Patient       Patient will benefit from skilled therapeutic intervention in order to improve the following deficits and impairments:  Pain, Postural dysfunction, Difficulty walking, Decreased strength, Decreased activity tolerance, Abnormal gait, Increased muscle spasms, Decreased endurance, Decreased balance, Decreased coordination, Decreased knowledge of use of DME, Decreased mobility, Decreased safety awareness, Impaired sensation, Impaired tone  Visit Diagnosis: Other abnormalities of gait and mobility  Unsteadiness on feet  Muscle weakness  (generalized)     Problem List Patient Active Problem List   Diagnosis Date Noted  . Lumbar spinal stenosis   . Radicular pain   . Gait disorder   . Difficulty in urination 07/13/2018  . Back pain 07/12/2018  . Hyperlipidemia 04/16/2018  . Generalized anxiety disorder with panic attacks 04/16/2018  . AKI (acute kidney injury) ([place])   . Benign essential HTN   . Depression with anxiety   . Lumbar radiculopathy 03/09/2018  . Paraparesis ([place]) 03/09/2018  . Essential hypertension   . Chronic nonintractable headache   . Leukocytosis   . Acute blood loss anemia   . Postoperative pain   . Generalized weakness   . Spinal stenosis at L4-L5 level 03/04/2018  . Spinal stenosis of lumbar region 12/28/2015  . Chest pain 04/16/2015    Alda Lea, PT 01/08/2019, 8:23 PM  [facility] [address] [place], [name], [address] Phone: [phone]   Fax:  [phone]  Name: STEPHEN TURNBAUGH MRN: [id number] Date of Birth: 1961-05-19

## 2019-01-11 ENCOUNTER — Ambulatory Visit: Payer: Self-pay | Admitting: Physical Therapy

## 2019-01-11 DIAGNOSIS — R2689 Other abnormalities of gait and mobility: Secondary | ICD-10-CM

## 2019-01-11 DIAGNOSIS — M6281 Muscle weakness (generalized): Secondary | ICD-10-CM

## 2019-01-12 NOTE — Therapy (Signed)
[facility] [address] [place], [name], [address] Phone: [phone]   Fax:  [phone]  Physical Therapy Treatment  Patient Details  Name: [name] MRN: [id number] Date of Birth: [birth year] Referring Provider (PT): Meredith Staggers, MD   Encounter Date: 01/11/2019  PT End of Session - 01/12/19 2010    Visit Number  28    Number of Visits  28    Date for PT Re-Evaluation  01/10/19    Authorization Type  [facility]; ?worker's comp - email sent on 11-09-18 to insurance specialist asking to confirm which insurance is primary     PT Start Time  1105    PT Stop Time  1150    PT Time Calculation (min)  45 min    Equipment Utilized During Treatment  Gait belt    Activity Tolerance  Patient tolerated treatment well    Behavior During Therapy  [facility] for tasks assessed/performed       Past Medical History:  Diagnosis Date  . Anxiety   . Back pain    related to spinal stenosis and disc problem, radiates down left buttocks to leg., weakness occ.  [name] Dyspnea   . Headache   . Hyperlipidemia   . Hypertension   . PONV (postoperative nausea and vomiting)   . Vaginal foreign object    "Uses Femring"    Past Surgical History:  Procedure Laterality Date  . ABDOMINAL HYSTERECTOMY    . CARDIAC CATHETERIZATION N/A 04/18/2015   Procedure: Left Heart Cath and Coronary Angiography;  Surgeon: [name], MD;  Location: [place];  Service: Cardiovascular;  Laterality: N/A;  . FOOT SURGERY Bilateral    [place] "bunion,bone spur, tendon" (1) -6'16, (1)-10'16  . IR EPIDUROGRAPHY  07/21/2018  . LUMBAR LAMINECTOMY/DECOMPRESSION MICRODISCECTOMY Bilateral 12/28/2015   Procedure: MICRO LUMBAR DECOMPRESSION L4 - L5 BILATERALLY;  Surgeon: [name], MD;  [facility];  Service: Orthopedics;  Laterality: Bilateral;  . LUMBAR LAMINECTOMY/DECOMPRESSION MICRODISCECTOMY Bilateral 03/04/2018   Procedure:  Revision of Microlumbar Decompression Bilateral Lumbar Four-Five;  Surgeon: [name], MD;  Location: [facility];  Service: Orthopedics;  Laterality: Bilateral;  90 mins  . WISDOM TOOTH EXTRACTION    . WOUND EXPLORATION N/A 03/04/2018   Procedure: EXPLORATION OF LUMBAR DECOMPRESSION WOUND;  Surgeon: [name], MD;  Location: [facility];  Service: Orthopedics;  Laterality: N/A;    There were no vitals filed for this visit.  Subjective Assessment - 01/12/19 1928    Subjective  Pt using only 1 Lofstrand crutch - states "I'm doing pretty good with just the one and I like it better than using 2 crutches"    Pertinent History  decompression and laminectomy on 12/28/15 at L4-L5  with redo decompression surgery on May 9,2019, post-op paraparesis with surgery to remove small hematoma; 07/12/18 fall at work (involved rolling chair) with hospital admission, transfer to [facility] with d/c home 08/03/18; HTN, Bil foot surgery     Patient Stated Goals  decrease pain in legs and improve LE strength and walking    Currently in Pain?  Yes    Pain Score  5     Pain Location  Back    Pain Orientation  Lower    Pain Descriptors / Indicators  Aching;Discomfort;Dull    Pain Type  Chronic pain    Pain Onset  More than a month ago    Pain Frequency  Intermittent  [place] Adult PT Treatment/Exercise - 01/12/19 0001      Ambulation/Gait   Ambulation/Gait  Yes    Ambulation/Gait Assistance  4: Min guard    Ambulation/Gait Assistance Details  Rt AFO donned    Ambulation Distance (Feet)  150 Feet    Assistive device  Lofstrands    Gait Pattern  Decreased dorsiflexion - right;Decreased step length - right;Right steppage    Ambulation Surface  Level;Indoor    Gait velocity  1.28 ft/sec= 25.6 secs    Stairs  Yes    Stairs Assistance  5: Supervision    Stair Management Technique  Step to pattern;Forwards;Other (comment)   with Lofstrand crutches   Number of Stairs  4    Height of Stairs   6      Timed Up and Go Test   Normal TUG (seconds)  44.7   with 1 Lofstrand crutch      Knee/Hip Exercises: Aerobic   Recumbent Bike  SciFit level 2.5 x 5" - LE's only      Knee/Hip Exercises: Machines for Strengthening   Cybex Leg Press  40# bil. LE's 2 sets 10 reps; 25# RLE only 10 reps             PT Education - 01/12/19 2004    Education Details  added squats, tap ups and stepping over to HEP; marching in place    Person(s) Educated  Patient    Methods  Explanation    Comprehension  Verbalized understanding;Returned demonstration       PT Short Term Goals - 11/12/18 1942      PT SHORT TERM GOAL #1   Title  Improve TUG score from 52.88 secs to </= 40 secs with use of RW to demo improved mobility and reduced fall risk.    Baseline  52.88 secs - 11-11-18    Time  4    Period  Weeks    Status  New    Target Date  12/12/18      PT SHORT TERM GOAL #2   Title  Pt will increase gait velocity from 1.51 ft/sec to >/= 1.9 ft/sec with use of RW for incr. gait efficiency.    Baseline  21.66 secs = 1.51 ft/sec with RW - 11-11-18    Time  4    Period  Weeks    Status  New    Target Date  12/12/18      PT SHORT TERM GOAL #3   Title  Perform sit to stand with 1 UE support and stabilize independently (without bracing LE's against mat table) x 5 reps to demo improved LE strength.    Time  4    Period  Weeks    Status  New    Target Date  12/12/18      PT SHORT TERM GOAL #4   Title  Amb. 52' with SPC with CGA on flat, even surface.      Baseline  pt using RW for assist. with amb.    Time  4    Period  Weeks    Status  New    Target Date  12/12/18      PT SHORT TERM GOAL #5   Title  Independent in updated HEP including aquatic exercise program.    Time  4    Period  Weeks    Status  New    Target Date  12/12/18        PT Long Term Goals -  01/12/19 1930      PT LONG TERM GOAL #1   Title  Improve TUG score to </= 25 secs with RW for reduced fall risk.     Baseline  52.88 secs with RW on 11-11-18; 44.7 secs with 1 Lofstrand crutch on 01-11-19    Status  Not Met      PT LONG TERM GOAL #2   Title  Patient will improve gait velocity to >/= 2.3 ft/sec with Rt AFO and RW, demonstrating lesser fall risk    Baseline  21.66 secs = 1.51 ft/sec with RW;  25.6 secs = 1.28 ft/sec with 1 Lofstrand crutch - 01-12-19    Status  Not Met      PT LONG TERM GOAL #3   Title  Negotiate 4 steps with 1 hand rail using a step over step sequence.    Baseline  step by step sequence with use of hand rail and with 1 Lofstrand crutch - 01-11-19    Status  Not Met      PT LONG TERM GOAL #4   Title  Modified independent household amb. without device (with use of AFO on RLE).    Baseline  not met 01-12-19    Status  Not Met      PT LONG TERM GOAL #5   Title  Amb. 500' with SPC with SBA for increased community accessibility.    Baseline  not met - pt using 1 Lofstrand crutch for approx. 150' - 01-11-19    Status  Not Met            Plan - 01/12/19 2000    Clinical Impression Statement  Pt has not met any of revised LTG's although pt has progressed with ambulation from use of RW to bil. Lofstrand crutches to use of 1 Lofstrand crutch.  None of the 5 LTG's have been fully met.  Pt's gait speed is slower with use of only 1 Lofstrand crutch, but pt states she prefers to use only 1 rather than both of them.      Rehab Potential  Good    Clinical Impairments Affecting Rehab Potential  decr safety awareness with pattern of falls due to poor judgement (walking without AFO, walking without RW, sitting on rolling chair)    PT Frequency  2x / week    PT Duration  8 weeks    PT Treatment/Interventions  Manual techniques;Patient/family education;Therapeutic exercise;Therapeutic activities;Gait training;ADLs/Self Care Home Management;Neuromuscular re-education;Functional mobility training;Passive range of motion;Balance training;Aquatic Therapy;Biofeedback;DME Instruction;Stair  training;Orthotic Fit/Training;Dry needling    PT Next Visit Plan  cont with aquatic therapy -pt on hold until [facility] reopens [phone] closure)    PT Home Exercise Plan  seated ab sets and ball squeezes; leg lengthener and leg presses , standing static balance without UE; added runner's stretch and heel cord stretch to HEP    Consulted and Agree with Plan of Care  Patient       Patient will benefit from skilled therapeutic intervention in order to improve the following deficits and impairments:  Pain, Postural dysfunction, Difficulty walking, Decreased strength, Decreased activity tolerance, Abnormal gait, Increased muscle spasms, Decreased endurance, Decreased balance, Decreased coordination, Decreased knowledge of use of DME, Decreased mobility, Decreased safety awareness, Impaired sensation, Impaired tone  Visit Diagnosis: Other abnormalities of gait and mobility  Muscle weakness (generalized)     Problem List Patient Active Problem List   Diagnosis Date Noted  . Lumbar spinal stenosis   . Radicular pain   .  Gait disorder   . Difficulty in urination 07/13/2018  . Back pain 07/12/2018  . Hyperlipidemia 04/16/2018  . Generalized anxiety disorder with panic attacks 04/16/2018  . AKI (acute kidney injury) ([place])   . Benign essential HTN   . Depression with anxiety   . Lumbar radiculopathy 03/09/2018  . Paraparesis ([place]) 03/09/2018  . Essential hypertension   . Chronic nonintractable headache   . Leukocytosis   . Acute blood loss anemia   . Postoperative pain   . Generalized weakness   . Spinal stenosis at L4-L5 level 03/04/2018  . Spinal stenosis of lumbar region 12/28/2015  . Chest pain 04/16/2015    Alda Lea, PT 01/12/2019, 8:11 PM  [facility] [address] [place], [name], [address] Phone: [phone]   Fax:  [phone]  Name: KADIE BALESTRIERI MRN: [id number] Date of Birth:  1961-04-02

## 2019-01-17 ENCOUNTER — Ambulatory Visit: Payer: Self-pay | Admitting: Physical Therapy

## 2019-02-08 ENCOUNTER — Encounter: Payer: Self-pay | Admitting: Physical Medicine & Rehabilitation

## 2019-02-08 ENCOUNTER — Other Ambulatory Visit: Payer: Self-pay

## 2019-02-08 ENCOUNTER — Encounter
Payer: BLUE CROSS/BLUE SHIELD | Attending: Physical Medicine & Rehabilitation | Admitting: Physical Medicine & Rehabilitation

## 2019-02-08 DIAGNOSIS — Z9889 Other specified postprocedural states: Secondary | ICD-10-CM

## 2019-02-08 DIAGNOSIS — Z09 Encounter for follow-up examination after completed treatment for conditions other than malignant neoplasm: Secondary | ICD-10-CM | POA: Insufficient documentation

## 2019-02-08 DIAGNOSIS — Z800 Family history of malignant neoplasm of digestive organs: Secondary | ICD-10-CM | POA: Insufficient documentation

## 2019-02-08 DIAGNOSIS — Z809 Family history of malignant neoplasm, unspecified: Secondary | ICD-10-CM | POA: Insufficient documentation

## 2019-02-08 DIAGNOSIS — M5416 Radiculopathy, lumbar region: Secondary | ICD-10-CM | POA: Insufficient documentation

## 2019-02-08 DIAGNOSIS — Z808 Family history of malignant neoplasm of other organs or systems: Secondary | ICD-10-CM | POA: Insufficient documentation

## 2019-02-08 DIAGNOSIS — Z807 Family history of other malignant neoplasms of lymphoid, hematopoietic and related tissues: Secondary | ICD-10-CM | POA: Insufficient documentation

## 2019-02-08 DIAGNOSIS — Z801 Family history of malignant neoplasm of trachea, bronchus and lung: Secondary | ICD-10-CM | POA: Insufficient documentation

## 2019-02-08 DIAGNOSIS — Z803 Family history of malignant neoplasm of breast: Secondary | ICD-10-CM | POA: Insufficient documentation

## 2019-02-08 DIAGNOSIS — G8220 Paraplegia, unspecified: Secondary | ICD-10-CM

## 2019-02-08 DIAGNOSIS — F419 Anxiety disorder, unspecified: Secondary | ICD-10-CM | POA: Insufficient documentation

## 2019-02-08 DIAGNOSIS — I10 Essential (primary) hypertension: Secondary | ICD-10-CM | POA: Insufficient documentation

## 2019-02-08 DIAGNOSIS — Z90710 Acquired absence of both cervix and uterus: Secondary | ICD-10-CM | POA: Insufficient documentation

## 2019-02-08 DIAGNOSIS — M62838 Other muscle spasm: Secondary | ICD-10-CM | POA: Insufficient documentation

## 2019-02-08 MED ORDER — GABAPENTIN 600 MG PO TABS: 600.0000 mg | ORAL_TABLET | Freq: Four times a day (QID) | ORAL | 3 refills | Status: DC

## 2019-02-08 NOTE — Progress Notes (Signed)
Subjective:    Patient ID: [name], female    DOB: September 27, 1961, 58 y.o.   MRN: [id number]    HPI   VIRTUAL VISIT-patient is at home. MD at office.   This is a telephone call follow up visit for [name]. She has been having more pain in her back and legs sometimes with associated give away weakness.  She went ahead and increased the gabapentin to 600 mg 4 times daily with some benefit over the last 2 weeks.  She is sleeping fairly well.  Uses tramadol twice daily with benefit also.  She has not done as much from an exercise and stretching standpoint recently due to the increase in her pain and worries about falling.  In talking to her later during the conversation, she let it be known that she was not wearing her AFO as much as she had been.  Her husband noticed that she was not walking as well without it.  She asked if she should still be using it.  I reiterated that she should be.  She tells me that she may have a follow-up visit with orthopedic surgery next month.  She has had injections previously.  No new recommendations were made by orthopedics recently.  Pain Inventory Average Pain 7 Pain Right Now 7 My pain is constant and burning  In the last 24 hours, has pain interfered with the following? General activity 4 Relation with others 3 Enjoyment of life 7 What TIME of day is your pain at its worst? night Sleep (in general) Poor  Pain is worse with: walking, sitting, standing and some activites Pain improves with: rest, heat/ice, medication and TENS Relief from Meds: 0  Having a great deal more pain lately--- not getting relief  Mobility use a cane do you drive?  no  Function disabled: date disabled . I need assistance with the following:  meal prep, household duties and shopping  Neuro/Psych weakness numbness tingling spasms  Prior Studies Any changes since last visit?  yes  Had an episode of weakness Sunday feeling like her legs would give  out-- did not fall.  She has been doing home exercises and doesn't know if she over did something but has been having a great deal of back pain recently and TENS has not helped or heat or tramadol  Physicians involved in your care Orthopedist [facility]   Family History  Problem Relation Age of Onset  . Heart attack Mother   . Lung cancer Father   . Cancer Father   . Pancreatic cancer Sister   . Breast cancer Sister 59  . Throat cancer Brother   . Multiple myeloma Sister   . Breast cancer Sister        diagnosed in her 49's  . Heart attack Sister   . Stomach cancer Cousin   . Colon cancer Neg Hx    Social History   Socioeconomic History  . Marital status: Married    Spouse name: Not on file  . Number of children: Not on file  . Years of education: Not on file  . Highest education level: Not on file  Occupational History  . Not on file  Social Needs  . Financial resource strain: Not on file  . Food insecurity:    Worry: Not on file    Inability: Not on file  . Transportation needs:    Medical: Not on file    Non-medical: Not on file  Tobacco Use  .  Smoking status: Never Smoker  . Smokeless tobacco: Never Used  Substance and Sexual Activity  . Alcohol use: No  . Drug use: No  . Sexual activity: Yes    Partners: Male    Birth control/protection: Post-menopausal  Lifestyle  . Physical activity:    Days per week: Not on file    Minutes per session: Not on file  . Stress: Not on file  Relationships  . Social connections:    Talks on phone: Not on file    Gets together: Not on file    Attends religious service: Not on file    Active member of club or organization: Not on file    Attends meetings of clubs or organizations: Not on file    Relationship status: Not on file  Other Topics Concern  . Not on file  Social History Narrative  . Not on file   Past Surgical History:  Procedure Laterality Date  . ABDOMINAL HYSTERECTOMY    . CARDIAC CATHETERIZATION N/A  04/18/2015   Procedure: Left Heart Cath and Coronary Angiography;  Surgeon: [name], MD;  Location: [facility];  Service: Cardiovascular;  Laterality: N/A;  . FOOT SURGERY Bilateral    [place] "bunion,bone spur, tendon" (1) -6'16, (1)-10'16  . IR EPIDUROGRAPHY  07/21/2018  . LUMBAR LAMINECTOMY/DECOMPRESSION MICRODISCECTOMY Bilateral 12/28/2015   Procedure: MICRO LUMBAR DECOMPRESSION L4 - L5 BILATERALLY;  Surgeon: [name], MD;  Location: [facility];  Service: Orthopedics;  Laterality: Bilateral;  . LUMBAR LAMINECTOMY/DECOMPRESSION MICRODISCECTOMY Bilateral 03/04/2018   Procedure: Revision of Microlumbar Decompression Bilateral Lumbar Four-Five;  Surgeon: [name], MD;  Location: [facility];  Service: Orthopedics;  Laterality: Bilateral;  90 mins  . WISDOM TOOTH EXTRACTION    . WOUND EXPLORATION N/A 03/04/2018   Procedure: EXPLORATION OF LUMBAR DECOMPRESSION WOUND;  Surgeon: [name], MD;  Location: [facility];  Service: Orthopedics;  Laterality: N/A;   Past Medical History:  Diagnosis Date  . Anxiety   . Back pain    related to spinal stenosis and disc problem, radiates down left buttocks to leg., weakness occ.  [name] Dyspnea   . Headache   . Hyperlipidemia   . Hypertension   . PONV (postoperative nausea and vomiting)   . Vaginal foreign object    "Uses Femring"   There were no vitals taken for this visit.  Opioid Risk Score:   Fall Risk Score:  `1  Depression screen PHQ 2/9  Depression screen [facility] 2/9 08/17/2018 07/05/2018 05/03/2018 04/01/2018  Decreased Interest 0 0 0 1  Down, Depressed, Hopeless 0 0 0 1  PHQ - 2 Score 0 0 0 2  Altered sleeping - - - 1  Tired, decreased energy - - - 1  Change in appetite - - - 1  Feeling bad or failure about yourself  - - - 0  Trouble concentrating - - - 0  Moving slowly or fidgety/restless - - - 0  Suicidal thoughts - - - 0  PHQ-9 Score - - - 5  Difficult doing work/chores - - - Somewhat difficult    Review of Systems   Constitutional: Negative.   HENT: Negative.   Eyes: Negative.   Respiratory: Negative.   Cardiovascular: Negative.   Gastrointestinal: Negative.   Endocrine: Negative.   Genitourinary: Negative.   Musculoskeletal: Positive for back pain.  Skin: Negative.   Allergic/Immunologic: Negative.   Neurological: Positive for weakness and numbness.       Tingling  Hematological: Negative.  Psychiatric/Behavioral: Negative.   All other systems reviewed and are negative.          Assessment & Plan:  Medical Problem List and Plan: 1.Decreased functional mobilitysecondary to acute onchronic lumbar radiculopathy and foraminal stenosis bilaterally with decompression May 2019. more recent fall with exacerbation of pain and motor or sensory deficits.  -continue with HEP per direction of outpt therapies. Would like to get back to outpatient therapies when possible             -I have seen gradual gains with her strength and gait.              -I believe some of her symptoms are related to chronic radiculitis and her previous nerve injury. Explained to her that symptoms might wax and wane.   -needs to maintain activity and ROM if at all possible especially if she is unable to participate in formal rehab  -Also mention to the patient that some of her instability may be in fact that she is not wearing her AFO which is secondarily destabilizing the leg.               2. Pain Management:Neurontin:  -continue at680m QID as she's been doing -baclofen 142mTID -continueUltram 50 mg every 12 hours. #60  -We will continue the controlled substance monitoring program, this consists of regular clinic visits, examinations, routine drug screening, pill [place] well [place].     -voltaren gel for left knee -  4. Mood:  -Patient  appears to be very positive about her rehab and works hard 5. .Constipation:.  -Continue with current regimen   17 minutes of phone time spent. Follow up in person next month

## 2019-02-10 ENCOUNTER — Other Ambulatory Visit: Payer: Self-pay | Admitting: Obstetrics and Gynecology

## 2019-02-10 DIAGNOSIS — Z1231 Encounter for screening mammogram for malignant neoplasm of breast: Secondary | ICD-10-CM

## 2019-02-14 ENCOUNTER — Ambulatory Visit: Payer: Self-pay | Admitting: Family Medicine

## 2019-02-15 ENCOUNTER — Other Ambulatory Visit: Payer: Self-pay | Admitting: Family Medicine

## 2019-02-15 ENCOUNTER — Ambulatory Visit (INDEPENDENT_AMBULATORY_CARE_PROVIDER_SITE_OTHER): Payer: BLUE CROSS/BLUE SHIELD | Admitting: Family Medicine

## 2019-02-15 ENCOUNTER — Other Ambulatory Visit: Payer: Self-pay

## 2019-02-15 ENCOUNTER — Encounter: Payer: Self-pay | Admitting: Family Medicine

## 2019-02-15 VITALS — BP 164/90 | HR 80 | Resp 12

## 2019-02-15 DIAGNOSIS — I10 Essential (primary) hypertension: Secondary | ICD-10-CM | POA: Diagnosis not present

## 2019-02-15 DIAGNOSIS — E785 Hyperlipidemia, unspecified: Secondary | ICD-10-CM

## 2019-02-15 DIAGNOSIS — F418 Other specified anxiety disorders: Secondary | ICD-10-CM | POA: Diagnosis not present

## 2019-02-15 DIAGNOSIS — M5416 Radiculopathy, lumbar region: Secondary | ICD-10-CM

## 2019-02-15 MED ORDER — AMLODIPINE BESYLATE 5 MG PO TABS: 5.0000 mg | ORAL_TABLET | Freq: Every day | ORAL | 0 refills | Status: DC

## 2019-02-15 MED ORDER — ATORVASTATIN CALCIUM 20 MG PO TABS: 20.0000 mg | ORAL_TABLET | Freq: Every day | ORAL | 1 refills | Status: DC

## 2019-02-15 MED ORDER — CARVEDILOL 6.25 MG PO TABS: 6.2500 mg | ORAL_TABLET | Freq: Two times a day (BID) | ORAL | 0 refills | Status: DC

## 2019-02-15 MED ORDER — ALPRAZOLAM 0.25 MG PO TABS: ORAL_TABLET | ORAL | 1 refills | Status: DC

## 2019-02-15 MED FILL — AMLODIPINE BESYLATE 5 MG TA: 5 | 90 days supply | Qty: 90 | Fill #0

## 2019-02-15 MED FILL — ATORVASTATIN 20 MG TABLET: 20 | 90 days supply | Qty: 90 | Fill #0

## 2019-02-15 NOTE — Assessment & Plan Note (Signed)
Low-fat diet recommended. Atorvastatin 20 mg to resume. We will plan on checking lipid panel next office visit.

## 2019-02-15 NOTE — Telephone Encounter (Signed)
Rx need to sent to [facility], [facility] per pharmacy.

## 2019-02-15 NOTE — Assessment & Plan Note (Signed)
Gabapentin increased recently. She is now interested in Cymbalta 30 mg. Pending epidural injections. Continue following with neurosurgeon.

## 2019-02-15 NOTE — Assessment & Plan Note (Signed)
Not well controlled. Possible complications of elevated BP discussed. For now no changes in Coreg 6.125 mg twice daily. She agrees with trying amlodipine 5 mg daily. Continue monitoring BP daily. Continue low-salt diet. Clearly instructed about warning signs. I will see her back in 6 weeks with BP readings.

## 2019-02-15 NOTE — Progress Notes (Signed)
Virtual Visit via Video Note   I connected with [name] on 02/15/19 at 10:30 AM EDT by a video enabled telemedicine application and verified that I am speaking with the correct person using two identifiers.  Location patient: home Location provider:home office Persons participating in the virtual visit: patient, provider  I discussed the limitations of evaluation and management by telemedicine and the availability of in person appointments. She expressed understanding and agreed to proceed.   HPI: [name] is a 58 yo female last seen on 08/17/19. She is being seen today for chronic disease management.  Anxiety and depression: Anxiety getting worse die to current COVID-19 pandemia. She takes Alprazolam 0.25 mg daily as needed,she ran out.Medication helps. She denies depressed mood or suicidal thoughts.  HTN: She is on Coreg 6.125 mg bid. BP has been high, 150's/90's. Getting headaches "a lot", no associated nausea,vomiting,or photophobia. Negative visual changes, chest pain, dyspnea, palpitation, claudication, focal weakness, or edema.  Lab Results  Component Value Date   CREATININE 0.85 08/02/2018   BUN 14 08/02/2018   NA 135 08/02/2018   K 3.8 08/02/2018   CL 102 08/02/2018   CO2 28 08/02/2018    Hyperlipidemia: Currently on Atorvastatin 20 mg daily,ran out a few days ago Following a low fat diet: Not consistently..  She has not noted side effects with medication.  Lab Results  Component Value Date   CHOL 287 (H) 04/19/2018   HDL 76.40 04/19/2018   LDLCALC 196 (H) 04/19/2018   TRIG 73.0 04/19/2018   CHOLHDL 4 04/19/2018   Chronic back pain with radiculopathy. Follows with Dr Naaman Plummer. Recently Gabapentin increased. Takes Tramadol occasionally. Planning on epidural injections. She is not interested in surgical management.   TKW:IOXBDZHGD positives and negatives per HPI. COVID-19 screening questions: Denies new fever,cough,sore throat,or possible  exposure to COVID-19. Negative for loss in the sense of smell or taste.  Past Medical History:  Diagnosis Date  . Anxiety   . Back pain    related to spinal stenosis and disc problem, radiates down left buttocks to leg., weakness occ.  [name] Dyspnea   . Headache   . Hyperlipidemia   . Hypertension   . PONV (postoperative nausea and vomiting)   . Vaginal foreign object    "Uses Femring"    Past Surgical History:  Procedure Laterality Date  . ABDOMINAL HYSTERECTOMY    . CARDIAC CATHETERIZATION N/A 04/18/2015   Procedure: Left Heart Cath and Coronary Angiography;  Surgeon: [name], MD;  Location: [facility];  Service: Cardiovascular;  Laterality: N/A;  . FOOT SURGERY Bilateral    [place] "bunion,bone spur, tendon" (1) -6'16, (1)-10'16  . IR EPIDUROGRAPHY  07/21/2018  . LUMBAR LAMINECTOMY/DECOMPRESSION MICRODISCECTOMY Bilateral 12/28/2015   Procedure: MICRO LUMBAR DECOMPRESSION L4 - L5 BILATERALLY;  Surgeon: [name], MD;  Location: [facility];  Service: Orthopedics;  Laterality: Bilateral;  . LUMBAR LAMINECTOMY/DECOMPRESSION MICRODISCECTOMY Bilateral 03/04/2018   Procedure: Revision of Microlumbar Decompression Bilateral Lumbar Four-Five;  Surgeon: [name], MD;  Location: [facility];  Service: Orthopedics;  Laterality: Bilateral;  90 mins  . WISDOM TOOTH EXTRACTION    . WOUND EXPLORATION N/A 03/04/2018   Procedure: EXPLORATION OF LUMBAR DECOMPRESSION WOUND;  Surgeon: [name], MD;  Location: [facility];  Service: Orthopedics;  Laterality: N/A;    Family History  Problem Relation Age of Onset  . Heart attack Mother   . Lung cancer Father   . Cancer Father   . Pancreatic cancer Sister   .  Breast cancer Sister 86  . Throat cancer Brother   . Multiple myeloma Sister   . Breast cancer Sister        diagnosed in her 53's  . Heart attack Sister   . Stomach cancer Cousin   . Colon cancer Neg Hx     Social History   Socioeconomic History  . Marital status:  Married    Spouse name: Not on file  . Number of children: Not on file  . Years of education: Not on file  . Highest education level: Not on file  Occupational History  . Not on file  Social Needs  . Financial resource strain: Not on file  . Food insecurity:    Worry: Not on file    Inability: Not on file  . Transportation needs:    Medical: Not on file    Non-medical: Not on file  Tobacco Use  . Smoking status: Never Smoker  . Smokeless tobacco: Never Used  Substance and Sexual Activity  . Alcohol use: No  . Drug use: No  . Sexual activity: Yes    Partners: Male    Birth control/protection: Post-menopausal  Lifestyle  . Physical activity:    Days per week: Not on file    Minutes per session: Not on file  . Stress: Not on file  Relationships  . Social connections:    Talks on phone: Not on file    Gets together: Not on file    Attends religious service: Not on file    Active member of club or organization: Not on file    Attends meetings of clubs or organizations: Not on file    Relationship status: Not on file  . Intimate partner violence:    Fear of current or ex partner: No    Emotionally abused: No    Physically abused: No    Forced sexual activity: No  Other Topics Concern  . Not on file  Social History Narrative  . Not on file      Current Outpatient Medications:  .  acetaminophen (TYLENOL) 325 MG tablet, Take 1-2 tablets (325-650 mg total) by mouth every 4 (four) hours as needed for mild pain., Disp: , Rfl:  .  acidophilus (RISAQUAD) CAPS capsule, Take 1 capsule by mouth daily., Disp: , Rfl:  .  ALPRAZolam (XANAX) 0.25 MG tablet, TAKE 1 TABLET BY MOUTH ONCE DAILY AS NEEDED FOR ANXIETY OR SLEEP, Disp: 30 tablet, Rfl: 1 .  amLODipine (NORVASC) 5 MG tablet, Take 1 tablet (5 mg total) by mouth daily., Disp: 90 tablet, Rfl: 0 .  atorvastatin (LIPITOR) 20 MG tablet, Take 1 tablet (20 mg total) by mouth daily., Disp: 90 tablet, Rfl: 1 .  baclofen (LIORESAL) 10  MG tablet, Take 1 tablet (10 mg total) by mouth 3 (three) times daily., Disp: 90 tablet, Rfl: 3 .  bisacodyl (DULCOLAX) 10 MG suppository, Place 1 suppository (10 mg total) rectally daily as needed for moderate constipation., Disp: 12 suppository, Rfl: 0 .  carvedilol (COREG) 6.25 MG tablet, Take 1 tablet (6.25 mg total) by mouth 2 (two) times daily., Disp: 180 tablet, Rfl: 0 .  diclofenac sodium (VOLTAREN) 1 % GEL, Apply 2 g topically 3 (three) times daily., Disp: 1 Tube, Rfl: 1 .  docusate sodium (COLACE) 100 MG capsule, Take 1 capsule (100 mg total) by mouth 2 (two) times daily., Disp: 60 capsule, Rfl: 1 .  Estradiol Acetate (FEMRING) 0.05 MG/24HR RING, Place vaginally., Disp: , Rfl:  .  gabapentin (NEURONTIN) 600 MG tablet, Take 1 tablet (600 mg total) by mouth 4 (four) times daily., Disp: 120 tablet, Rfl: 3 .  polyethylene glycol (MIRALAX / GLYCOLAX) packet, Take 17 g by mouth daily., Disp: 30 each, Rfl: 0 .  traMADol (ULTRAM) 50 MG tablet, Take 1 tablet (50 mg total) by mouth every 12 (twelve) hours., Disp: 60 tablet, Rfl: 2  EXAM:  VITALS per patient if applicable:BP (!) 409/81   Pulse 80   Resp 12   GENERAL: alert, oriented, appears well and in no acute distress  HEENT: atraumatic, conjunttiva clear, no obvious facial abnormalities on inspection.  NECK: normal movements of the head and neck  LUNGS: on inspection no signs of respiratory distress, breathing rate appears normal, no obvious gross SOB, gasping or wheezing  CV: no obvious cyanosis  Cheryl: moves all visible extremities without noticeable abnormality  PSYCH/NEURO: pleasant and cooperative, no obvious depression. Anxious. Speech and thought processing grossly intact  ASSESSMENT AND PLAN:  Discussed the following assessment and plan:  Essential hypertension  Depression with anxiety  Hyperlipidemia, unspecified hyperlipidemia type  Lumbar radiculopathy  Essential hypertension Not well controlled. Possible  complications of elevated BP discussed. For now no changes in Coreg 6.125 mg twice daily. She agrees with trying amlodipine 5 mg daily. Continue monitoring BP daily. Continue low-salt diet. Clearly instructed about warning signs. I will see her back in 6 weeks with BP readings.   Hyperlipidemia Low-fat diet recommended. Atorvastatin 20 mg to resume. We will plan on checking lipid panel next office visit.  Depression with anxiety She is interested in trying Cymbalta 30 mg daily, which may also help with chronic back pain. Continue alprazolam 0.25 mg daily as needed. We discussed side effects of medication. Follow-up in 6 weeks, before if needed.  Lumbar radiculopathy Gabapentin increased recently. She is now interested in Cymbalta 30 mg. Pending epidural injections. Continue following with neurosurgeon.     I discussed the assessment and treatment plan with the patient. She was provided an opportunity to ask questions and all were answered. She agreed with the plan and demonstrated an understanding of the instructions.   The patient was advised to call back or seek an in-person evaluation if the symptoms worsen or if the condition fails to improve as anticipated.  No follow-ups on file.    Betty [name], MD

## 2019-02-15 NOTE — Assessment & Plan Note (Signed)
She is interested in trying Cymbalta 30 mg daily, which may also help with chronic back pain. Continue alprazolam 0.25 mg daily as needed. We discussed side effects of medication. Follow-up in 6 weeks, before if needed.

## 2019-02-17 ENCOUNTER — Telehealth: Payer: Self-pay

## 2019-02-17 MED FILL — ALPRAZolam 0.25 MG TABS: 0.25 | 30 days supply | Qty: 30 | Fill #0

## 2019-02-17 NOTE — Telephone Encounter (Signed)
Copied from [place] [id number]. Topic: General - Other >> Feb 17, 2019  2:39 PM Oneta Rack wrote:  Relation to pt: self  Call back number: [phone] Pharmacy:  [facility], [place] [phone] (Fax)    Reason for call:  Patient I =n need of clarity today regarding why her blood pressure medication was changed from carvedilol (COREG) 6.25 MG tablet  to amLODipine (NORVASC) 5 MG tablet, patient would like to discuss today, please advise

## 2019-02-18 ENCOUNTER — Other Ambulatory Visit: Payer: Self-pay | Admitting: *Deleted

## 2019-02-18 MED ORDER — CARVEDILOL 6.25 MG PO TABS: 6.2500 mg | ORAL_TABLET | Freq: Two times a day (BID) | ORAL | 0 refills | Status: DC

## 2019-02-18 NOTE — Telephone Encounter (Signed)
Spoke with patient and went over last office note concerning medications. Patient verbalized understanding.

## 2019-02-18 NOTE — Telephone Encounter (Signed)
Pt stated she requested a return call to discuss why her blood pressure medication was changed. Pt stated she really needs someone to call her back to advise why her medication was changed.

## 2019-02-21 ENCOUNTER — Other Ambulatory Visit: Payer: Self-pay | Admitting: *Deleted

## 2019-02-21 MED ORDER — CARVEDILOL 6.25 MG PO TABS: 6.2500 mg | ORAL_TABLET | Freq: Two times a day (BID) | ORAL | 0 refills | Status: DC

## 2019-02-21 MED FILL — CARVEDILOL 6.25 MG TABLET: 6.25 | 90 days supply | Qty: 180 | Fill #0

## 2019-02-21 NOTE — Telephone Encounter (Signed)
Rx re-sent to the pharmacy.

## 2019-02-21 NOTE — Telephone Encounter (Signed)
Copied from [place] [id number]. Topic: Quick Communication - Rx Refill/Question >> Feb 21, 2019 11:31 AM Alanda Slim E wrote: Medication: carvedilol (COREG) 6.25 MG tablet  Has the patient contacted their pharmacy? Yes - pharmacy stated they didn't have it on file and it needs to be sent to [facility] due to [facility] being closed   Preferred Pharmacy (with phone number or street name): [place], [name] - [address] [phone] (Phone) [phone] (Fax)    Agent: Please be advised that RX refills may take up to 3 business days. We ask that you follow-up with your pharmacy.

## 2019-02-22 MED FILL — FEMRING 0.05 MG/24HR RING: 0.05 | 90 days supply | Qty: 1 | Fill #0

## 2019-03-03 DIAGNOSIS — M961 Postlaminectomy syndrome, not elsewhere classified: Secondary | ICD-10-CM | POA: Diagnosis not present

## 2019-03-03 DIAGNOSIS — M5416 Radiculopathy, lumbar region: Secondary | ICD-10-CM | POA: Diagnosis not present

## 2019-03-17 DIAGNOSIS — M545 Low back pain: Secondary | ICD-10-CM | POA: Diagnosis not present

## 2019-03-17 DIAGNOSIS — M5416 Radiculopathy, lumbar region: Secondary | ICD-10-CM | POA: Diagnosis not present

## 2019-03-23 ENCOUNTER — Encounter
Payer: BC Managed Care – PPO | Attending: Physical Medicine & Rehabilitation | Admitting: Physical Medicine & Rehabilitation

## 2019-03-23 ENCOUNTER — Ambulatory Visit: Payer: BLUE CROSS/BLUE SHIELD | Admitting: Registered Nurse

## 2019-03-23 ENCOUNTER — Other Ambulatory Visit: Payer: Self-pay

## 2019-03-23 ENCOUNTER — Encounter: Payer: Self-pay | Admitting: Physical Medicine & Rehabilitation

## 2019-03-23 VITALS — BP 128/66 | HR 84 | Temp 98.4°F | Ht 65.0 in | Wt 157.0 lb

## 2019-03-23 DIAGNOSIS — G8220 Paraplegia, unspecified: Secondary | ICD-10-CM

## 2019-03-23 DIAGNOSIS — M48061 Spinal stenosis, lumbar region without neurogenic claudication: Secondary | ICD-10-CM | POA: Diagnosis not present

## 2019-03-23 DIAGNOSIS — Z801 Family history of malignant neoplasm of trachea, bronchus and lung: Secondary | ICD-10-CM | POA: Insufficient documentation

## 2019-03-23 DIAGNOSIS — M5410 Radiculopathy, site unspecified: Secondary | ICD-10-CM

## 2019-03-23 DIAGNOSIS — Z90710 Acquired absence of both cervix and uterus: Secondary | ICD-10-CM | POA: Insufficient documentation

## 2019-03-23 DIAGNOSIS — Z9889 Other specified postprocedural states: Secondary | ICD-10-CM | POA: Diagnosis not present

## 2019-03-23 DIAGNOSIS — Z807 Family history of other malignant neoplasms of lymphoid, hematopoietic and related tissues: Secondary | ICD-10-CM | POA: Diagnosis not present

## 2019-03-23 DIAGNOSIS — M62838 Other muscle spasm: Secondary | ICD-10-CM | POA: Insufficient documentation

## 2019-03-23 DIAGNOSIS — Z803 Family history of malignant neoplasm of breast: Secondary | ICD-10-CM | POA: Diagnosis not present

## 2019-03-23 DIAGNOSIS — I10 Essential (primary) hypertension: Secondary | ICD-10-CM | POA: Diagnosis not present

## 2019-03-23 DIAGNOSIS — F419 Anxiety disorder, unspecified: Secondary | ICD-10-CM | POA: Diagnosis not present

## 2019-03-23 DIAGNOSIS — Z809 Family history of malignant neoplasm, unspecified: Secondary | ICD-10-CM | POA: Diagnosis not present

## 2019-03-23 DIAGNOSIS — Z09 Encounter for follow-up examination after completed treatment for conditions other than malignant neoplasm: Secondary | ICD-10-CM | POA: Insufficient documentation

## 2019-03-23 DIAGNOSIS — Z808 Family history of malignant neoplasm of other organs or systems: Secondary | ICD-10-CM | POA: Insufficient documentation

## 2019-03-23 DIAGNOSIS — Z800 Family history of malignant neoplasm of digestive organs: Secondary | ICD-10-CM | POA: Diagnosis not present

## 2019-03-23 DIAGNOSIS — M5416 Radiculopathy, lumbar region: Secondary | ICD-10-CM | POA: Diagnosis not present

## 2019-03-23 NOTE — Progress Notes (Signed)
Subjective:    Patient ID: [name], female    DOB: [birth year], 58 y.o.   MRN: [id number]  HPI   [name] is here in follow-up of her chronic low back pain associated polyradiculopathy and gait deficits.  She continues to have ongoing back and leg pain with associated weakness.  She feels that she may be a bit weaker over the last few months and has concerns that her problem is becoming worse.  She does feel that her medications are controlling her pain adequately enough.  She is using her rolling walker for balance.  She has an AFO for support of her right foot which she has been using more often.  She does do some walking and stretching on her own at home.  Denies any issues with her bowels and bladder.  Sleep has been fair.  She has been in contact with outpatient therapy was discussed resuming her PT sometime soon now that things are opening back up.  Pain Inventory Average Pain 6 Pain Right Now 5 My pain is burning and tingling  In the last 24 hours, has pain interfered with the following? General activity 5 Relation with others 3 Enjoyment of life 5 What TIME of day is your pain at its worst? evening Sleep (in general) Fair  Pain is worse with: walking and standing Pain improves with: rest and medication Relief from Meds: 5  Mobility use a cane use a walker ability to climb steps?  yes  Function disabled: date disabled 2019  Neuro/Psych weakness numbness tremor tingling trouble walking spasms anxiety  Prior Studies Any changes since last visit?  no  Physicians involved in your care Any changes since last visit?  no   Family History  Problem Relation Age of Onset  . Heart attack Mother   . Lung cancer Father   . Cancer Father   . Pancreatic cancer Sister   . Breast cancer Sister 18  . Throat cancer Brother   . Multiple myeloma Sister   . Breast cancer Sister        diagnosed in her 56's  . Heart attack Sister   . Stomach cancer  Cousin   . Colon cancer Neg Hx    Social History   Socioeconomic History  . Marital status: Married    Spouse name: Not on file  . Number of children: Not on file  . Years of education: Not on file  . Highest education level: Not on file  Occupational History  . Not on file  Social Needs  . Financial resource strain: Not on file  . Food insecurity:    Worry: Not on file    Inability: Not on file  . Transportation needs:    Medical: Not on file    Non-medical: Not on file  Tobacco Use  . Smoking status: Never Smoker  . Smokeless tobacco: Never Used  Substance and Sexual Activity  . Alcohol use: No  . Drug use: No  . Sexual activity: Yes    Partners: Male    Birth control/protection: Post-menopausal  Lifestyle  . Physical activity:    Days per week: Not on file    Minutes per session: Not on file  . Stress: Not on file  Relationships  . Social connections:    Talks on phone: Not on file    Gets together: Not on file    Attends religious service: Not on file    Active member of club or organization: Not  on file    Attends meetings of clubs or organizations: Not on file    Relationship status: Not on file  Other Topics Concern  . Not on file  Social History Narrative  . Not on file   Past Surgical History:  Procedure Laterality Date  . ABDOMINAL HYSTERECTOMY    . CARDIAC CATHETERIZATION N/A 04/18/2015   Procedure: Left Heart Cath and Coronary Angiography;  Surgeon: [name], MD;  Location: [facility];  Service: Cardiovascular;  Laterality: N/A;  . FOOT SURGERY Bilateral    [place] "bunion,bone spur, tendon" (1) -6'16, (1)-10'16  . IR EPIDUROGRAPHY  07/21/2018  . LUMBAR LAMINECTOMY/DECOMPRESSION MICRODISCECTOMY Bilateral 12/28/2015   Procedure: MICRO LUMBAR DECOMPRESSION L4 - L5 BILATERALLY;  Surgeon: [name], MD;  Location: [facility];  Service: Orthopedics;  Laterality: Bilateral;  . LUMBAR LAMINECTOMY/DECOMPRESSION MICRODISCECTOMY Bilateral  03/04/2018   Procedure: Revision of Microlumbar Decompression Bilateral Lumbar Four-Five;  Surgeon: [name], MD;  Location: [facility];  Service: Orthopedics;  Laterality: Bilateral;  90 mins  . WISDOM TOOTH EXTRACTION    . WOUND EXPLORATION N/A 03/04/2018   Procedure: EXPLORATION OF LUMBAR DECOMPRESSION WOUND;  Surgeon: [name], MD;  Location: [facility];  Service: Orthopedics;  Laterality: N/A;   Past Medical History:  Diagnosis Date  . Anxiety   . Back pain    related to spinal stenosis and disc problem, radiates down left buttocks to leg., weakness occ.  [name] Dyspnea   . Headache   . Hyperlipidemia   . Hypertension   . PONV (postoperative nausea and vomiting)   . Vaginal foreign object    "Uses Femring"   BP 128/66   Pulse 84   Temp 98.4 F (36.9 C)   Ht '5\' 5"'$  (1.651 m)   Wt 157 lb (71.2 kg)   SpO2 97%   BMI 26.13 kg/m   Opioid Risk Score:   Fall Risk Score:  `1  Depression screen PHQ 2/9  Depression screen [facility] 2/9 08/17/2018 07/05/2018 05/03/2018 04/01/2018  Decreased Interest 0 0 0 1  Down, Depressed, Hopeless 0 0 0 1  PHQ - 2 Score 0 0 0 2  Altered sleeping - - - 1  Tired, decreased energy - - - 1  Change in appetite - - - 1  Feeling bad or failure about yourself  - - - 0  Trouble concentrating - - - 0  Moving slowly or fidgety/restless - - - 0  Suicidal thoughts - - - 0  PHQ-9 Score - - - 5  Difficult doing work/chores - - - Somewhat difficult     Review of Systems  Constitutional: Negative.   HENT: Negative.   Eyes: Negative.   Respiratory: Negative.   Cardiovascular: Negative.   Gastrointestinal: Positive for nausea.  Endocrine: Negative.   Genitourinary: Negative.   Musculoskeletal: Positive for arthralgias, gait problem and myalgias.  Skin: Negative.   Allergic/Immunologic: Negative.   Neurological: Positive for tremors, weakness and numbness.  Hematological: Negative.   Psychiatric/Behavioral: The patient is nervous/anxious.   All other systems  reviewed and are negative.      Objective:   Physical Exam  General: Alert and oriented x 3, No apparent distress HEENT: Head is normocephalic, atraumatic, PERRLA, EOMI, sclera anicteric, oral mucosa pink and moist, dentition intact, ext ear canals clear,  Neck: Supple without JVD or lymphadenopathy Heart: Reg rate and rhythm. No murmurs rubs or gallops Chest: CTA bilaterally without wheezes, rales, or rhonchi; no distress Abdomen: Soft, non-tender, non-distended,  bowel sounds positive. Extremities: No clubbing, cyanosis, or edema. Pulses are 2+ Skin: Clean and intact without signs of breakdown Neuro: Pt is cognitively appropriate with normal insight, memory, and awareness. Cranial nerves 2-12 are intact.   Reflexes are 2+ in all 4's. Fine motor coordination is intact. No tremors. Motor function is grossly 5/5 UE. LLE 4/5. RLE 3/5 prox to 2/5 distally with cogwheeling.  Ambulates and drags the right leg significantly during swing through.  There is some buckling of the leg during stance as well.  Decreased sensation RLE. +Tinel's both wrists. .  Musculoskeletal: Full ROM, No pain with AROM or PROM in the neck, trunk, or extremities. Posture appropriate Psych: Pt's affect is appropriate. Pt is cooperative         Assessment & Plan:  Medical Problem List and Plan: 1.Decreased functional mobilitysecondary to acute onchronic lumbar radiculopathy and foraminal stenosis bilaterally with decompression May 2019. more recent fall with exacerbation of pain and motor or sensory deficits.  -HEP, resume aquatic outpt therapy soon?   0 -any decrease in strength is likely due to decreased activity with covid, less threapy.  I see no consistent motor changes on her exam today encouraged more HEP, ROM, needs to work on desensitizationas well.   -extensive paperwork completed today for disability 2. Pain Management:Neurontin:           -continue at662m QID as she's been doing -baclofen 162mTID -continueUltram 50 mg every 12 hours. #60  -We will continue the controlled substance monitoring program, this consists of regular clinic visits, examinations, routine drug screening, pill [place] well [place].   -voltaren gel for left knee -  4. Mood:  -Patient appears to be very positive about her rehab and works hard 5. .Constipation:.  -Continue with current regimen 6. Bilateral CTS (mild)  -bilateral wrist splints at night  -Better mechanics with walker   Thirty minutes of face to face patient care time were spent during this visit. All questions were encouraged and answered. Follow up in 3 months

## 2019-04-01 ENCOUNTER — Ambulatory Visit (INDEPENDENT_AMBULATORY_CARE_PROVIDER_SITE_OTHER): Payer: BC Managed Care – PPO | Admitting: Family Medicine

## 2019-04-01 ENCOUNTER — Other Ambulatory Visit: Payer: Self-pay

## 2019-04-01 ENCOUNTER — Encounter: Payer: Self-pay | Admitting: Family Medicine

## 2019-04-01 VITALS — BP 124/82 | HR 85 | Temp 98.3°F | Resp 12 | Ht 65.0 in

## 2019-04-01 DIAGNOSIS — R202 Paresthesia of skin: Secondary | ICD-10-CM

## 2019-04-01 DIAGNOSIS — M542 Cervicalgia: Secondary | ICD-10-CM | POA: Diagnosis not present

## 2019-04-01 DIAGNOSIS — R200 Anesthesia of skin: Secondary | ICD-10-CM

## 2019-04-01 DIAGNOSIS — Z1329 Encounter for screening for other suspected endocrine disorder: Secondary | ICD-10-CM | POA: Diagnosis not present

## 2019-04-01 MED ORDER — PREDNISONE 20 MG PO TABS: ORAL_TABLET | ORAL | 0 refills | Status: AC

## 2019-04-01 MED FILL — predniSONE 20 MG TABS: 20 | 12 days supply | Qty: 20 | Fill #0

## 2019-04-01 NOTE — Progress Notes (Signed)
ACUTE VISIT   HPI:  Chief Complaint  Patient presents with  . Bilateral hand pain and numbness    started over a month ago, getting worse. Pain radiates up arms sometimes    [name] is a 58 y.o. female, who is here today complaining of upper extremities pain,numbness,and tingling.  She is reporting symptoms as new, noted first "a little bit over a month ago." Right > left. She is right handed. Pain is "bad", it seems to be worse at night, affecting her sleep.  Gradual onset. Problem is getting worse. For the past few days she has noted hands weakness, she cannot grab,aggravates pain.  Upper back and cervical pain radiated to right upper extremity down to hands. Cervical limitation of range of motion. Pain is exacerbated by movement. No alleviating factors identified.  She has tried shaking hands and using pillow support for upper extremities while sleeping, these have not helped.  She denies history of trauma or unusual physical activity. Negative for fever, chills, changes in appetite, sore throat, dysphasia, or skin rash.  She has taking tramadol 50 mg, which she takes for chronic pain.  She has history of lower back pain with radiculopathy, she follows with pain management and received periodic nerve blocks.   Review of Systems  HENT: Negative for dental problem and facial swelling.   Eyes: Negative for visual disturbance.  Gastrointestinal: Negative for abdominal pain, nausea and vomiting.  Musculoskeletal: Positive for arthralgias, gait problem and neck pain.  Skin: Negative for pallor and wound.  Neurological: Negative for dizziness, tremors, speech difficulty and headaches.  Hematological: Negative for adenopathy. Does not bruise/bleed easily.  Psychiatric/Behavioral: Positive for sleep disturbance.  Rest of ROS, see pertinent positives and negatives in HPI.   Current Outpatient Medications on File Prior to Visit  Medication Sig Dispense  Refill  . acetaminophen (TYLENOL) 325 MG tablet Take 1-2 tablets (325-650 mg total) by mouth every 4 (four) hours as needed for mild pain.    [name] acidophilus (RISAQUAD) CAPS capsule Take 1 capsule by mouth daily.    [name] ALPRAZolam (XANAX) 0.25 MG tablet TAKE 1 TABLET BY MOUTH ONCE DAILY AS NEEDED FOR ANXIETY OR SLEEP 30 tablet 1  . amLODipine (NORVASC) 5 MG tablet Take 1 tablet (5 mg total) by mouth daily. 90 tablet 0  . atorvastatin (LIPITOR) 20 MG tablet Take 1 tablet (20 mg total) by mouth daily. 90 tablet 1  . baclofen (LIORESAL) 10 MG tablet Take 1 tablet (10 mg total) by mouth 3 (three) times daily. 90 tablet 3  . bisacodyl (DULCOLAX) 10 MG suppository Place 1 suppository (10 mg total) rectally daily as needed for moderate constipation. 12 suppository 0  . carvedilol (COREG) 6.25 MG tablet Take 1 tablet (6.25 mg total) by mouth 2 (two) times daily. 180 tablet 0  . diclofenac sodium (VOLTAREN) 1 % GEL Apply 2 g topically 3 (three) times daily. 1 Tube 1  . docusate sodium (COLACE) 100 MG capsule Take 1 capsule (100 mg total) by mouth 2 (two) times daily. 60 capsule 1  . Estradiol Acetate (FEMRING) 0.05 MG/24HR RING Place vaginally.    . gabapentin (NEURONTIN) 600 MG tablet Take 1 tablet (600 mg total) by mouth 4 (four) times daily. 120 tablet 3  . polyethylene glycol (MIRALAX / GLYCOLAX) packet Take 17 g by mouth daily. 30 each 0  . traMADol (ULTRAM) 50 MG tablet Take 1 tablet (50 mg total) by mouth every 12 (twelve) hours. [place]  tablet 2   No current facility-administered medications on file prior to visit.      Past Medical History:  Diagnosis Date  . Anxiety   . Back pain    related to spinal stenosis and disc problem, radiates down left buttocks to leg., weakness occ.  [name] Dyspnea   . Headache   . Hyperlipidemia   . Hypertension   . PONV (postoperative nausea and vomiting)   . Vaginal foreign object    "Uses Femring"   Allergies  Allergen Reactions  . Cephalosporins Anaphylaxis  .  Penicillins Anaphylaxis and Hives    Has patient had a PCN reaction causing immediate rash, facial/tongue/throat swelling, SOB or lightheadedness with hypotension: Yes Has patient had a PCN reaction causing severe rash involving mucus membranes or skin necrosis: Yes Has patient had a PCN reaction that required hospitalization Yes Has patient had a PCN reaction occurring within the last 10 years: No If all of the above answers are "NO", then may proceed with Cephalosporin use.   . Anesthetics, Amide Nausea And Vomiting    Does not know name of it. States they put it on record foot center.   [name] Peach [Prunus Persica] Hives  . Latex Hives, Itching and Rash    Social History   Socioeconomic History  . Marital status: Married    Spouse name: Not on file  . Number of children: Not on file  . Years of education: Not on file  . Highest education level: Not on file  Occupational History  . Not on file  Social Needs  . Financial resource strain: Not on file  . Food insecurity:    Worry: Not on file    Inability: Not on file  . Transportation needs:    Medical: Not on file    Non-medical: Not on file  Tobacco Use  . Smoking status: Never Smoker  . Smokeless tobacco: Never Used  Substance and Sexual Activity  . Alcohol use: No  . Drug use: No  . Sexual activity: Yes    Partners: Male    Birth control/protection: Post-menopausal  Lifestyle  . Physical activity:    Days per week: Not on file    Minutes per session: Not on file  . Stress: Not on file  Relationships  . Social connections:    Talks on phone: Not on file    Gets together: Not on file    Attends religious service: Not on file    Active member of club or organization: Not on file    Attends meetings of clubs or organizations: Not on file    Relationship status: Not on file  Other Topics Concern  . Not on file  Social History Narrative  . Not on file    Vitals:   04/01/19 1515  BP: 124/82  Pulse: 85  Resp: 12   Temp: 98.3 F (36.8 C)  SpO2: 99%   Body mass index is 26.13 kg/m.   Physical Exam  Nursing note and vitals reviewed. Constitutional: She is oriented to person, place, and time. She appears well-developed. She does not appear ill. No distress.  HENT:  Head: Normocephalic and atraumatic.  Eyes: Conjunctivae are normal.  Cardiovascular: Normal rate and regular rhythm.  No murmur heard. Respiratory: Effort normal. No respiratory distress.  Musculoskeletal:        General: No edema.     Cervical back: She exhibits decreased range of motion, tenderness and spasm. She exhibits no bony tenderness and no edema.  Comments: IP joints are not tender upon palpation and no signs of synovitis. Tinel and Phalen negative,bilateral.  Lymphadenopathy:    She has no cervical adenopathy.  Neurological: She is alert and oriented to person, place, and time. Gait (unstable,assisted by cane.) abnormal.  Reflex Scores:      Bicep reflexes are 2+ on the right side and 2+ on the left side. Minimal decreased straighten of hands when squeezing,symmetic. Rest no focal deficit appreciated.   Skin: Skin is warm. No rash noted. No erythema.  Psychiatric: Her mood appears anxious.  Well groomed ,good eye contact.    ASSESSMENT AND PLAN:  Ms.  Adela [name] was seen today for bilateral hand pain and numbness.  Diagnoses and all orders for this visit: Orders Placed This Encounter  Procedures  . Basic metabolic panel  . TSH  . Vitamin B12    Numbness and tingling of both upper extremities We discussed possible etiologies, history suggest radicular pain. She has tolerated prednisone in the past, after discussion of side effects, she agrees with prednisone taper. Recommend taking medication with breakfast. Clearly instructed about warning signs. If problem does not resolve in 4 to 6 weeks, further studies will be recommended.  If problem gets worse she needs to let me know, we will need cervical MRI  and/or EMG.  -     predniSONE (DELTASONE) 20 MG tablet; 3 tabs for 3 days, 2 tabs for 3 days, 1 tabs for 3 days, and 1/2 tab for 3 days. Take tables together with breakfast.  Cervicalgia Because no history of trauma, I do not think imaging is needed at this time. She is already taking a muscle relaxant as needed. Local heat or ice may also help. Cervical MRI done in 02/2018 showed multilevel cervical spondylosis. Mild spinal stenosis C4-5 and moderate spinal stenosis C5-6. Mild spinal stenosis C6-7.Multilevel thoracic degenerative change without significant stenosis or cord compression   Return if symptoms worsen or fail to improve.  -CherylKandis Cocking was advised to seek immediate medical attention if sudden worsening symptoms or to follow if they persist or if new concerns arise.       Steffan Caniglia G. [name], MD  [facility]. [place] office.

## 2019-04-01 NOTE — Patient Instructions (Signed)
A few things to remember from today's visit:   Numbness and tingling of both upper extremities - Plan: Basic metabolic panel, TSH, Vitamin B12, predniSONE (DELTASONE) 20 MG tablet, CANCELED: Vitamin B12, CANCELED: TSH, CANCELED: Basic metabolic panel  Cervicalgia   Cervical Radiculopathy  Cervical radiculopathy means that a nerve in the neck is pinched or bruised. This can cause pain or loss of feeling (numbness) that runs from your neck to your arm and fingers. Follow these instructions at home: Managing pain  Take over-the-counter and prescription medicines only as told by your doctor.  If directed, put ice on the injured or painful area. ? Put ice in a plastic bag. ? Place a towel between your skin and the bag. ? Leave the ice on for 20 minutes, 2-3 times per day.  If ice does not help, you can try using heat. Take a warm shower or warm bath, or use a heat pack as told by your doctor.  You may try a gentle neck and shoulder massage. Activity  Rest as needed. Follow instructions from your doctor about any activities to avoid.  Do exercises as told by your doctor or physical therapist. General instructions  If you were given a soft collar, wear it as told by your doctor.  Use a flat pillow when you sleep.  Keep all follow-up visits as told by your doctor. This is important. Contact a doctor if:  Your condition does not improve with treatment. Get help right away if:  Your pain gets worse and is not controlled with medicine.  You lose feeling or feel weak in your hand, arm, face, or leg.  You have a fever.  You have a stiff neck.  You cannot control when you poop or pee (have incontinence).  You have trouble with walking, balance, or talking. This information is not intended to replace advice given to you by your health care provider. Make sure you discuss any questions you have with your health care provider. Document Released: 10/02/2011 Document Revised:  03/20/2016 Document Reviewed: 12/07/2014 Elsevier Interactive Patient Education  2019 [employer].  Please be sure medication list is accurate. If a new problem present, please set up appointment sooner than planned today.

## 2019-04-02 LAB — VITAMIN B12: Vitamin B-12: 621 pg/mL (ref 200–1100)

## 2019-04-02 LAB — BASIC METABOLIC PANEL
BUN: 11 mg/dL (ref 7–25)
CO2: 30 mmol/L (ref 20–32)
Calcium: 9.1 mg/dL (ref 8.6–10.4)
Chloride: 104 mmol/L (ref 98–110)
Creat: 0.99 mg/dL (ref 0.50–1.05)
Glucose, Bld: 88 mg/dL (ref 65–99)
Potassium: 3.9 mmol/L (ref 3.5–5.3)
Sodium: 140 mmol/L (ref 135–146)

## 2019-04-02 LAB — TSH: TSH: 1.8 mIU/L (ref 0.40–4.50)

## 2019-04-07 ENCOUNTER — Ambulatory Visit: Payer: Self-pay | Attending: Specialist | Admitting: Physical Therapy

## 2019-04-07 ENCOUNTER — Other Ambulatory Visit: Payer: Self-pay

## 2019-04-07 ENCOUNTER — Encounter: Payer: Self-pay | Admitting: Physical Therapy

## 2019-04-07 DIAGNOSIS — G8929 Other chronic pain: Secondary | ICD-10-CM

## 2019-04-07 DIAGNOSIS — M5442 Lumbago with sciatica, left side: Secondary | ICD-10-CM | POA: Insufficient documentation

## 2019-04-07 DIAGNOSIS — R2681 Unsteadiness on feet: Secondary | ICD-10-CM

## 2019-04-07 DIAGNOSIS — R2689 Other abnormalities of gait and mobility: Secondary | ICD-10-CM

## 2019-04-07 DIAGNOSIS — M6281 Muscle weakness (generalized): Secondary | ICD-10-CM

## 2019-04-08 NOTE — Therapy (Addendum)
[facility] [address] [place] [facility], [name], [address] Phone: [phone]   Fax:  [phone]  Physical Therapy Treatment  Patient Details  Name: [name] MRN: [id number] Date of Birth: [birth year] Referring Provider (PT): Meredith Staggers, MD  CLINIC OPERATION CHANGES: [facility] is open at lower capacity following universal masking, social distancing, and patient screening.  The patient's COVID risk of  omplications score is 1.  Encounter Date: 04/07/2019  PT End of Session - 04/08/19 1431    Visit Number  29   visit 1 since 01-11-19   Number of Visits  45    Date for PT Re-Evaluation  06/07/19    Authorization Type  [facility] [address]; ?worker's comp - email sent on 11-09-18 to insurance specialist asking to confirm which insurance is primary; pt reports now she has [employer] as of mid March 2020    PT Start Time  1501    PT Stop Time  1550    PT Time Calculation (min)  49 min    Equipment Utilized During Treatment  Gait belt    Activity Tolerance  Patient tolerated treatment well    Behavior During Therapy  WFL for tasks assessed/performed       Past Medical History:  Diagnosis Date  . Anxiety   . Back pain    related to spinal stenosis and disc problem, radiates down left buttocks to leg., weakness occ.  [name] Dyspnea   . Headache   . Hyperlipidemia   . Hypertension   . PONV (postoperative nausea and vomiting)   . Vaginal foreign object    "Uses Femring"    Past Surgical History:  Procedure Laterality Date  . ABDOMINAL HYSTERECTOMY    . CARDIAC CATHETERIZATION N/A 04/18/2015   Procedure: Left Heart Cath and Coronary Angiography;  Surgeon: [name], MD;  Location: [facility];  Service: Cardiovascular;  Laterality: N/A;  . FOOT SURGERY Bilateral    [place] "bunion,bone spur, tendon" (1) -6'16, (1)-10'16  . IR EPIDUROGRAPHY  07/21/2018  . LUMBAR  LAMINECTOMY/DECOMPRESSION MICRODISCECTOMY Bilateral 12/28/2015   Procedure: MICRO LUMBAR DECOMPRESSION L4 - L5 BILATERALLY;  Surgeon: [name], MD;  Location: [facility];  Service: Orthopedics;  Laterality: Bilateral;  . LUMBAR LAMINECTOMY/DECOMPRESSION MICRODISCECTOMY Bilateral 03/04/2018   Procedure: Revision of Microlumbar Decompression Bilateral Lumbar Four-Five;  Surgeon: [name], MD;  Location: [facility];  Service: Orthopedics;  Laterality: Bilateral;  90 mins  . WISDOM TOOTH EXTRACTION    . WOUND EXPLORATION N/A 03/04/2018   Procedure: EXPLORATION OF LUMBAR DECOMPRESSION WOUND;  Surgeon: [name], MD;  Location: [facility];  Service: Orthopedics;  Laterality: N/A;    There were no vitals filed for this visit.  Subjective Assessment - 04/08/19 1421    Subjective  Pt states she is now having numbness and tingling in bil. UE's with RUE worse than LUE; started getting really bad about 6 weeks ago; pt using RW for assistance with amb.; last seen in clinic on 12-13-18 and last aquatic therapy session attended 01-11-19    Patient Stated Goals  improve walking    Currently in Pain?  Yes    Pain Score  5     Pain Location  Hand    Pain Orientation  Right;Left    Pain Descriptors / Indicators  Pins and needles;Tingling    Pain Type  Neuropathic pain    Pain Onset  More than a month ago    Aggravating  Factors   no specific    Pain Relieving Factors  inactivity helps some    Effect of Pain on Daily Activities  dfficulty opening jars and also now hard to grip crutches or walker due to pain with weight bearing thru [facility]/Exercise - 04/08/19 0001      Transfers   Transfers  Sit to Stand    Number of Reps  Other reps (comment)   5 reps without UE support from mat     Ambulation/Gait   Ambulation/Gait  Yes    Ambulation/Gait Assistance  4: Min guard    Ambulation/Gait Assistance Details  AFO worn on RLE    Ambulation Distance  (Feet)  100 Feet    Assistive device  Rolling walker    Gait Pattern  Decreased dorsiflexion - right;Decreased step length - right;Right steppage    Ambulation Surface  Level;Indoor    Gait velocity  45.97 secs = .71 ft/sec w/RW   initial score 1" 1 sec = .54 ft/sec with RW     Balance   Balance Assessed  Yes      Static Standing Balance   Static Standing - Balance Support  No upper extremity supported    Static Standing - Level of Assistance  Other (comment)   pt lost balance and lowered self down onto mat table   Static Standing - Comment/# of Minutes  4.25 secs      Timed Up and Go Test   Normal TUG (seconds)  45.97   with RW      Rt hamstrings 3+/5 Rt quads 4/5 Rt hip flexors 3+/5        PT Short Term Goals - 04/08/19 1433      PT SHORT TERM GOAL #1   Title  Improve TUG score from  47.5 secs to </= 41 secs with use of RW to demo improved mobility and reduced fall risk.    Baseline  52.88 secs - 11-11-18; 47.5 secs with RW on 04-07-19    Time  4    Period  Weeks    Status  New    Target Date  05/07/19      PT SHORT TERM GOAL #2   Title  Pt will increase gait velocity from .71 ft/sec with RW to >/= 1.2 ft/sec with use of RW for incr. gait efficiency.    Baseline  21.66 secs = 1.51 ft/sec with RW - 11-11-18; 45.97 secs = .71 ft/sec with RW on 04-07-19 ; 1" 1 sec initial trial    Time  4    Period  Weeks    Status  New    Target Date  05/07/19      PT SHORT TERM GOAL #3   Title  Perform sit to stand with no UE support and stabilize independently (without bracing LE's against mat table) x 5 reps to demo improved LE strength.    Baseline  Pt braced legs on reps 1 and 2 of 5 reps on 04-07-19    Time  4    Period  Weeks    Status  New    Target Date  05/07/19      PT SHORT TERM GOAL #4   Title  Amb. 115' with Lofstrand crutches  with CGA on flat, even surface.    Baseline  pt using RW for  assist. with amb.    Time  4    Period  Weeks    Status  New    Target  Date  05/07/19      PT SHORT TERM GOAL #5   Title  Independent in updated HEP including aquatic exercise program.    Time  4    Period  Weeks    Status  New    Target Date  05/07/19        PT Long Term Goals - 04/08/19 1433      PT LONG TERM GOAL #1   Title  Improve TUG score to </= 35 secs with RW for reduced fall risk.    Baseline  52.88 secs with RW on 11-11-18; 44.7 secs with 1 Lofstrand crutch on 01-11-19; 47.5 secs    Time  8    Period  Weeks    Status  New    Target Date  06/07/19      PT LONG TERM GOAL #2   Title  Patient will improve gait velocity to >/= 1.5 ft/sec with Rt AFO and RW, demonstrating lesser fall risk    Baseline  21.66 secs = 1.51 ft/sec with RW;  25.6 secs = 1.28 ft/sec with 1 Lofstrand crutch - 01-12-19; 45.97 secs = .71 ft/sec ft/sec with RW on 04-07-19    Time  8    Period  Weeks    Status  New    Target Date  06/07/19      PT LONG TERM GOAL #3   Title  Negotiate 4 steps with 1 hand rail using a step by step sequence.    Baseline  step by step sequence with use of hand rail and with 1 Lofstrand crutch - 01-11-19    Time  8    Period  Weeks    Status  New    Target Date  06/07/19      PT LONG TERM GOAL #4   Title  Modified independent household amb. with use of 1 Lofstrand crutch (with use of AFO on RLE).    Baseline  not met 01-12-19    Time  8    Period  Weeks    Status  New    Target Date  06/07/19      PT LONG TERM GOAL #5   Title  --    Baseline  --    Status  --            Plan - 04/08/19 1453    Clinical Impression Statement  Pt presents with increased weakness in RLE with increased balance deficits since previous PT session on 01-12-19 due to clinic closing due to Covid.  Pt's TUG score has decreased from 44.7 secs w/1 crutch to 45.97 secs with RW today; gait velocity has decreased from 1.28 ft/sec with Lofstrand crutches to .71 ft/sec with RW;  pt is now occasionally dragging Rt foot, even with use of AFO on RLE.    Clinical  Presentation due to:  Paraparesis due to lumbar radiculopathy    Rehab Potential  Good    Clinical Impairments Affecting Rehab Potential  decr safety awareness with pattern of falls due to poor judgement (walking without AFO, walking without RW, sitting on rolling chair)    PT Frequency  2x / week    PT Duration  8 weeks    PT Treatment/Interventions  Manual techniques;Patient/family education;Therapeutic exercise;Therapeutic activities;Gait training;ADLs/Self Care Home Management;Neuromuscular re-education;Functional mobility training;Passive range of motion;Balance training;Aquatic  Therapy;Biofeedback;DME Instruction;Stair training;Orthotic Fit/Training;Dry needling    PT Next Visit Plan  cont with aquatic therapy -pt on hold until [facility] reopens [phone] closure)    PT Home Exercise Plan  seated ab sets and ball squeezes; leg lengthener and leg presses , standing static balance without UE; added runner's stretch and heel cord stretch to HEP    Consulted and Agree with Plan of Care  Patient       Patient will benefit from skilled therapeutic intervention in order to improve the following deficits and impairments:  Pain, Postural dysfunction, Difficulty walking, Decreased strength, Decreased activity tolerance, Abnormal gait, Increased muscle spasms, Decreased endurance, Decreased balance, Decreased coordination, Decreased knowledge of use of DME, Decreased mobility, Decreased safety awareness, Impaired sensation, Impaired tone  Visit Diagnosis: Other abnormalities of gait and mobility - Plan: PT plan of care cert/re-cert  Muscle weakness (generalized) - Plan: PT plan of care cert/re-cert  Unsteadiness on feet - Plan: PT plan of care cert/re-cert  Chronic left-sided low back pain with left-sided sciatica - Plan: PT plan of care cert/re-cert     Problem List Patient Active Problem List   Diagnosis Date Noted  . Lumbar spinal stenosis   . Radicular pain   . Gait disorder   . Difficulty in  urination 07/13/2018  . Back pain 07/12/2018  . Hyperlipidemia 04/16/2018  . Generalized anxiety disorder with panic attacks 04/16/2018  . AKI (acute kidney injury) ([place])   . Benign essential HTN   . Depression with anxiety   . Lumbar radiculopathy 03/09/2018  . Paraparesis ([place]) 03/09/2018  . Essential hypertension   . Chronic nonintractable headache   . Leukocytosis   . Acute blood loss anemia   . Postoperative pain   . Generalized weakness   . Spinal stenosis at L4-L5 level 03/04/2018  . Spinal stenosis of lumbar region 12/28/2015  . Chest pain 04/16/2015    Alda Lea, PT 04/08/2019, 2:58 PM  [facility] [address] [place], [name], [address] Phone: [phone]   Fax:  [phone]  Name: [name] MRN: [id number] Date of Birth: [birth year]

## 2019-04-11 ENCOUNTER — Ambulatory Visit: Payer: Self-pay

## 2019-04-11 NOTE — Telephone Encounter (Signed)
Pt c/o worsening pain in bilateral hands that travels up the arms. Pt stated that she is having numbness and tingling for over a month. Pt stated laying flat in bed make pain worse.  Pt stated pain is moderate and when it is especially bad it causes a "pulling" sensation to the left side of her chest. Pt stated that she barely slept last night. Pt stated that she cannot take this pain and needs to feel better. Due to pulling sensation she described, advised pt to go to ED for evaluation. Pt refused.  Called Sheena and was advised to send a note high priority to office for Dr [name] to review. .   Reason for Disposition . [1] Weakness or numbness in hand or fingers AND [2] present > 2 weeks  Answer Assessment - Initial Assessment Questions 1. LOCATION: "Where does it hurt?"       Tingling hands arms and pressure to chest last night  2. RADIATION: "Does the pain go anywhere else?" (e.g., into neck, jaw, arms, back)     Pain from hand goes up arm. 3. ONSET: "When did the chest pain begin?" (Minutes, hours or days)      Last night 4. PATTERN "Does the pain come and go, or has it been constant since it started?"  "Does it get worse with exertion?"      Constant cant grip in it progressively worse 5. DURATION: "How long does it last" (e.g., seconds, minutes, hours)     Constant- still having chest pressure 6. SEVERITY: "How bad is the pain?"  (e.g., Scale 1-10; mild, moderate, or severe)    - MILD (1-3): doesn't interfere with normal activities     - MODERATE (4-7): interferes with normal activities or awakens from sleep    - SEVERE (8-10): excruciating pain, unable to do any normal activities       pressure feels like hand to arms and feels like its pulling to chest 7. CARDIAC RISK FACTORS: "Do you have any history of heart problems or risk factors for heart disease?" (e.g., prior heart attack, angina; high blood pressure, diabetes, being overweight, high cholesterol, smoking, or strong family  history of heart disease)     H/o SOB stress test was done and worked up twice that was normal - stress  8. PULMONARY RISK FACTORS: "Do you have any history of lung disease?"  (e.g., blood clots in lung, asthma, emphysema, birth control pills)     no 9. CAUSE: "What do you think is causing the chest pain?"     Related to hand pain pt doesn't think it is her heart 10. OTHER SYMPTOMS: "Do you have any other symptoms?" (e.g., dizziness, nausea, vomiting, sweating, fever, difficulty breathing, cough)       Like pulling sensation-catches her breath when the pain  11. PREGNANCY: "Is there any chance you are pregnant?" "When was your last menstrual period?"       n/a  Answer Assessment - Initial Assessment Questions 1. ONSET: "When did the pain start?"     Over a month ago 2. LOCATION: "Where is the pain located?"     Both hands up the arms 3. PAIN: "How bad is the pain?" (Scale 1-10; or mild, moderate, severe)   - MILD (1-3): doesn't interfere with normal activities   - MODERATE (4-7): interferes with normal activities (e.g., work or school) or awakens from sleep   - SEVERE (8-10): excruciating pain, unable to use hand at all  Moderate 7/10 4. WORK OR EXERCISE: "Has there been any recent work or exercise that involved this part of the body?"     no 5. CAUSE: "What do you think is causing the pain?"     Nerve pain 6. AGGRAVATING FACTORS: "What makes the pain worse?" (e.g., using computer)     Laying down in bed 7. OTHER SYMPTOMS: "Do you have any other symptoms?" (e.g., neck pain, swelling, rash, numbness, fever)    Hand weakness, numbness and tingling  8. PREGNANCY: "Is there any chance you are pregnant?" "When was your last menstrual period?"     n/a  Protocols used: HAND AND WRIST PAIN-A-AH, CHEST PAIN-A-AH

## 2019-04-11 NOTE — Telephone Encounter (Signed)
Pt refusing ED disposition as advised by NT.

## 2019-04-12 ENCOUNTER — Ambulatory Visit
Admission: RE | Admit: 2019-04-12 | Discharge: 2019-04-12 | Disposition: A | Discharge status: Home / Self Care | Payer: BC Managed Care – PPO | Source: Ambulatory Visit | Attending: Obstetrics and Gynecology | Admitting: Obstetrics and Gynecology

## 2019-04-12 ENCOUNTER — Encounter: Payer: Self-pay | Admitting: Family Medicine

## 2019-04-12 ENCOUNTER — Ambulatory Visit (INDEPENDENT_AMBULATORY_CARE_PROVIDER_SITE_OTHER): Payer: BC Managed Care – PPO | Admitting: Family Medicine

## 2019-04-12 ENCOUNTER — Other Ambulatory Visit: Payer: Self-pay

## 2019-04-12 VITALS — BP 124/62 | HR 88 | Temp 97.7°F | Resp 12

## 2019-04-12 DIAGNOSIS — R202 Paresthesia of skin: Secondary | ICD-10-CM | POA: Diagnosis not present

## 2019-04-12 DIAGNOSIS — Z1231 Encounter for screening mammogram for malignant neoplasm of breast: Secondary | ICD-10-CM

## 2019-04-12 DIAGNOSIS — R200 Anesthesia of skin: Secondary | ICD-10-CM

## 2019-04-12 DIAGNOSIS — M542 Cervicalgia: Secondary | ICD-10-CM | POA: Diagnosis not present

## 2019-04-12 NOTE — Progress Notes (Signed)
HPI:  [name] is a 58 y.o. female, who is here today to follow on recent OV.  She was seen on 04/01/2019, when she was complaining about bilateral upper extremities numbness and tingling that has been going on for over a month (3 months) and progressively getting worse. Numbness and tingling extend from hands to forearms, arms, and neck. It does not follow dermatoma.  Problem is constant but seems to be worse at night. She has not identified exacerbating factors for upper extremity numbness and tingling, it is not affected by movement. No alleviating factors.  -Severe joint pain, no edema or erythema.  Upper extremity weakness, decreased grip. She has not noted skin rash.  Neck pain that also started about a month ago, no prior history. Pain is exacerbated by movement and alleviated by rest.  She completed prednisone taper, which did not help. She has history of chronic pain and lumbar radiculopathy, she is on tramadol 50 mg and gabapentin 600 mg twice daily. Tramadol does not help with pain.   Review of Systems  Constitutional: Positive for fatigue. Negative for appetite change, chills and fever.  HENT: Negative for mouth sores, sore throat and trouble swallowing.   Respiratory: Negative for cough, shortness of breath and wheezing.   Cardiovascular: Negative for chest pain and palpitations.  Gastrointestinal: Negative for abdominal pain, diarrhea and vomiting.  Musculoskeletal: Positive for gait problem.  Neurological: Negative for speech difficulty and headaches.  Psychiatric/Behavioral: Positive for sleep disturbance. Negative for confusion. The patient is nervous/anxious.   Rest see pertinent positives and negatives per HPI.   Current Outpatient Medications on File Prior to Visit  Medication Sig Dispense Refill  . acetaminophen (TYLENOL) 325 MG tablet Take 1-2 tablets (325-650 mg total) by mouth every 4 (four) hours as needed for mild pain.    [name]  acidophilus (RISAQUAD) CAPS capsule Take 1 capsule by mouth daily.    [name] ALPRAZolam (XANAX) 0.25 MG tablet TAKE 1 TABLET BY MOUTH ONCE DAILY AS NEEDED FOR ANXIETY OR SLEEP 30 tablet 1  . amLODipine (NORVASC) 5 MG tablet Take 1 tablet (5 mg total) by mouth daily. 90 tablet 0  . atorvastatin (LIPITOR) 20 MG tablet Take 1 tablet (20 mg total) by mouth daily. 90 tablet 1  . baclofen (LIORESAL) 10 MG tablet Take 1 tablet (10 mg total) by mouth 3 (three) times daily. 90 tablet 3  . bisacodyl (DULCOLAX) 10 MG suppository Place 1 suppository (10 mg total) rectally daily as needed for moderate constipation. 12 suppository 0  . carvedilol (COREG) 6.25 MG tablet Take 1 tablet (6.25 mg total) by mouth 2 (two) times daily. 180 tablet 0  . diclofenac sodium (VOLTAREN) 1 % GEL Apply 2 g topically 3 (three) times daily. 1 Tube 1  . docusate sodium (COLACE) 100 MG capsule Take 1 capsule (100 mg total) by mouth 2 (two) times daily. 60 capsule 1  . Estradiol Acetate (FEMRING) 0.05 MG/24HR RING Place vaginally.    . gabapentin (NEURONTIN) 600 MG tablet Take 1 tablet (600 mg total) by mouth 4 (four) times daily. 120 tablet 3  . polyethylene glycol (MIRALAX / GLYCOLAX) packet Take 17 g by mouth daily. 30 each 0  . traMADol (ULTRAM) 50 MG tablet Take 1 tablet (50 mg total) by mouth every 12 (twelve) hours. 60 tablet 2   No current facility-administered medications on file prior to visit.      Past Medical History:  Diagnosis Date  . Anxiety   .  Back pain    related to spinal stenosis and disc problem, radiates down left buttocks to leg., weakness occ.  [name] Dyspnea   . Headache   . Hyperlipidemia   . Hypertension   . PONV (postoperative nausea and vomiting)   . Vaginal foreign object    "Uses Femring"   Allergies  Allergen Reactions  . Cephalosporins Anaphylaxis  . Penicillins Anaphylaxis and Hives    Has patient had a PCN reaction causing immediate rash, facial/tongue/throat swelling, SOB or lightheadedness  with hypotension: Yes Has patient had a PCN reaction causing severe rash involving mucus membranes or skin necrosis: Yes Has patient had a PCN reaction that required hospitalization Yes Has patient had a PCN reaction occurring within the last 10 years: No If all of the above answers are "NO", then may proceed with Cephalosporin use.   . Anesthetics, Amide Nausea And Vomiting    Does not know name of it. States they put it on record foot center.   [name] Peach [Prunus Persica] Hives  . Latex Hives, Itching and Rash    Social History   Socioeconomic History  . Marital status: Married    Spouse name: Not on file  . Number of children: Not on file  . Years of education: Not on file  . Highest education level: Not on file  Occupational History  . Not on file  Social Needs  . Financial resource strain: Not on file  . Food insecurity    Worry: Not on file    Inability: Not on file  . Transportation needs    Medical: Not on file    Non-medical: Not on file  Tobacco Use  . Smoking status: Never Smoker  . Smokeless tobacco: Never Used  Substance and Sexual Activity  . Alcohol use: No  . Drug use: No  . Sexual activity: Yes    Partners: Male    Birth control/protection: Post-menopausal  Lifestyle  . Physical activity    Days per week: Not on file    Minutes per session: Not on file  . Stress: Not on file  Relationships  . Social Herbalist on phone: Not on file    Gets together: Not on file    Attends religious service: Not on file    Active member of club or organization: Not on file    Attends meetings of clubs or organizations: Not on file    Relationship status: Not on file  Other Topics Concern  . Not on file  Social History Narrative  . Not on file    Vitals:   04/12/19 1522  BP: 124/62  Pulse: 88  Resp: 12  Temp: 97.7 F (36.5 C)  SpO2: 98%   There is no height or weight on file to calculate BMI.   Physical Exam  Nursing note and vitals  reviewed. Constitutional: She is oriented to person, place, and time. She appears well-developed. She does not appear ill. No distress.  HENT:  Head: Normocephalic and atraumatic.  Eyes: Pupils are equal, round, and reactive to light. Conjunctivae are normal.  Cardiovascular: Normal rate and regular rhythm.  No murmur heard. Pulses:      Radial pulses are 2+ on the right side and 2+ on the left side.  Respiratory: Effort normal and breath sounds normal. No respiratory distress.  GI: Soft. There is no hepatomegaly. There is no abdominal tenderness.  Musculoskeletal:        General: No edema.  Cervical back: She exhibits decreased range of motion and tenderness. She exhibits no edema.       Back:     Comments: No signs of synovitis. Tenderness upon palpation of MCP and IP joints. Tinel negative. Phalen test positive,bilateral.  Lymphadenopathy:    She has no cervical adenopathy.  Neurological: She is alert and oriented to person, place, and time. She has normal strength. No sensory deficit. Gait abnormal.  Reflex Scores:      Bicep reflexes are 2+ on the right side and 2+ on the left side. Hand with mildly decreased grip, ? Pain related.  Skin: Skin is warm. No rash noted. No erythema.  Psychiatric: She has a normal mood and affect.  Well groomed, good eye contact.    ASSESSMENT AND PLAN:  [name] was seen today for arm pain.   Orders Placed This Encounter  Procedures  . MR Cervical Spine Wo Contrast  . Sedimentation rate  . C-reactive protein   Erythrocyte Sedimentation Rate     Component Value Date/Time   ESRSEDRATE 13 04/12/2019 1609   Lab Results  Component Value Date   CRP <1.0 04/12/2019    Numbness and tingling of both upper extremities Problem getting worse. We discussed possible etiologies,?  Carpal tunnel syndrome, radiculopathy, neuropathy. She prefers to hold on EMG and neuro referral but agrees with cervical MRI. Clearly instructed about  warning signs. Further recommendation will be given according to MRI results.  Cervicalgia Range of motion exercises recommended for now. Continue tramadol + Tylenol. We can consider PT if cervical MRI otherwise negative.    Return if symptoms worsen or fail to improve.   Deen Deguia G. [name], MD  [facility]. [place] office.

## 2019-04-12 NOTE — Patient Instructions (Signed)
A few things to remember from today's visit:   Numbness and tingling of both upper extremities - Plan: MR Cervical Spine Wo Contrast, Sedimentation rate, C-reactive protein  Cervicalgia - Plan: MR Cervical Spine Wo Contrast, Sedimentation rate, C-reactive protein   Please be sure medication list is accurate. If a new problem present, please set up appointment sooner than planned today.

## 2019-04-13 LAB — C-REACTIVE PROTEIN: CRP: <1 mg/dL (ref 0.5–20.0)

## 2019-04-13 LAB — SEDIMENTATION RATE: Sed Rate: 13 mm/hr (ref 0–30)

## 2019-04-19 ENCOUNTER — Other Ambulatory Visit: Payer: Self-pay | Admitting: Family Medicine

## 2019-04-19 ENCOUNTER — Telehealth: Payer: Self-pay | Admitting: *Deleted

## 2019-04-19 NOTE — Telephone Encounter (Signed)
Message sent to Dr. [name] for review and approval.  Copied from [place] [phone]. Topic: General - Other >> Apr 18, 2019  2:33 PM Leward Quan A wrote: Reason for CRM: Patient called to say that she have been scheduled to have her MRI on 05/11/2019 but will need a medication that will relax her or put her to sleep while she goes through the MRI process. Please call patient at Ph# [phone]

## 2019-04-20 ENCOUNTER — Other Ambulatory Visit: Payer: Self-pay

## 2019-04-20 ENCOUNTER — Ambulatory Visit: Payer: Self-pay | Admitting: Physical Therapy

## 2019-04-20 DIAGNOSIS — R2689 Other abnormalities of gait and mobility: Secondary | ICD-10-CM

## 2019-04-20 DIAGNOSIS — R2681 Unsteadiness on feet: Secondary | ICD-10-CM

## 2019-04-20 DIAGNOSIS — M6281 Muscle weakness (generalized): Secondary | ICD-10-CM

## 2019-04-20 NOTE — Telephone Encounter (Signed)
Noted. Nothing further needed at this time.  

## 2019-04-20 NOTE — Telephone Encounter (Signed)
Valium Rx sent to her pharmacy yesterday to take 1-2 tabs 10-15 min before test. Thanks, BJ

## 2019-04-21 ENCOUNTER — Ambulatory Visit: Payer: Self-pay | Admitting: Physical Therapy

## 2019-04-21 DIAGNOSIS — R2681 Unsteadiness on feet: Secondary | ICD-10-CM

## 2019-04-21 DIAGNOSIS — R2689 Other abnormalities of gait and mobility: Secondary | ICD-10-CM

## 2019-04-21 DIAGNOSIS — M6281 Muscle weakness (generalized): Secondary | ICD-10-CM

## 2019-04-21 NOTE — Therapy (Signed)
[facility] [address] [place], [name], [address] Phone: [phone]   Fax:  [phone]  Physical Therapy Treatment  Patient Details  Name: [name] MRN: [id number] Date of Birth: 08/09/1961 Referring Provider (PT): Meredith Staggers, MD   Encounter Date: 04/20/2019  PT End of Session - 04/21/19 2109    Visit Number  2   30 visits total   Number of Visits  15    Date for PT Re-Evaluation  06/07/19    Authorization Type  [facility]; ?worker's comp - email sent on 11-09-18 to insurance specialist asking to confirm which insurance is primary; pt reports now she has [employer] as of mid March 2020    PT Start Time  1300    PT Stop Time  1347    PT Time Calculation (min)  47 min    Activity Tolerance  Patient tolerated treatment well    Behavior During Therapy  [facility] for tasks assessed/performed       Past Medical History:  Diagnosis Date  . Anxiety   . Back pain    related to spinal stenosis and disc problem, radiates down left buttocks to leg., weakness occ.  [name] Dyspnea   . Headache   . Hyperlipidemia   . Hypertension   . PONV (postoperative nausea and vomiting)   . Vaginal foreign object    "Uses Femring"    Past Surgical History:  Procedure Laterality Date  . ABDOMINAL HYSTERECTOMY    . CARDIAC CATHETERIZATION N/A 04/18/2015   Procedure: Left Heart Cath and Coronary Angiography;  Surgeon: [name], MD;  Location: [facility];  Service: Cardiovascular;  Laterality: N/A;  . FOOT SURGERY Bilateral    [place] "bunion,bone spur, tendon" (1) -6'16, (1)-10'16  . IR EPIDUROGRAPHY  07/21/2018  . LUMBAR LAMINECTOMY/DECOMPRESSION MICRODISCECTOMY Bilateral 12/28/2015   Procedure: MICRO LUMBAR DECOMPRESSION L4 - L5 BILATERALLY;  Surgeon: [name], MD;  Location: [facility];  Service: Orthopedics;  Laterality: Bilateral;  . LUMBAR LAMINECTOMY/DECOMPRESSION MICRODISCECTOMY Bilateral  03/04/2018   Procedure: Revision of Microlumbar Decompression Bilateral Lumbar Four-Five;  Surgeon: [name], MD;  Location: [facility];  Service: Orthopedics;  Laterality: Bilateral;  90 mins  . WISDOM TOOTH EXTRACTION    . WOUND EXPLORATION N/A 03/04/2018   Procedure: EXPLORATION OF LUMBAR DECOMPRESSION WOUND;  Surgeon: [name], MD;  Location: [facility];  Service: Orthopedics;  Laterality: N/A;    There were no vitals filed for this visit.  Subjective Assessment - 04/21/19 2103    Subjective  Pt presents for aquatic therapy at [facility] - using RW for assistance with gait; pt reports hands continue to have numbness and tingling - states MRI isn't scheduled until 05-11-19    Patient is accompained by:  Family member    Pertinent History  decompression and laminectomy on 12/28/15 at L4-L5  with redo decompression surgery on May 9,2019, post-op paraparesis with surgery to remove small hematoma; 07/12/18 fall at work (involved rolling chair) with hospital admission, transfer to [place] with d/c home 08/03/18; HTN, Bil foot surgery     Patient Stated Goals  improve walking    Currently in Pain?  Yes    Pain Score  5     Pain Location  Hand    Pain Orientation  Right;Left    Pain Descriptors / Indicators  Numbness;Pins and needles;Tingling    Pain Type  Neuropathic pain    Pain Onset  1 to 4 weeks  ago    Pain Frequency  Constant      Aquatic therapy at [facility] - pool temp 87.4 degrees   Patient seen for aquatic therapy today.  Treatment took place in water 3.5-4 feet deep depending upon activity.  Pt entered and  Exited the pool via step negotiation with use of bilateral hand rails with CGA for safety.   Pt performed standing hip flexion, abduction and hip extension with knee extended -  10 reps each leg with UE support on pool edge for assist with balance  Marching in place 10 reps each leg with UE support Heel raises bil. LE's 10 reps with UE support on pool edge  Squats x 10 reps with bil. UE support  on pool edge  Pt performed water walking forwards, backwards and sideways 75'' x 2 reps each direction with UE support on noodle   Pt requires aquatic therapy for the unweighting provided by the buoyancy of the water and needs the viscosity for strengthening LE's and trunk musc.; current of water provided perturbations for balance challenges and for trunk stabilization                          PT Short Term Goals - 04/21/19 2113      PT SHORT TERM GOAL #1   Title  Improve TUG score from  47.5 secs to </= 41 secs with use of RW to demo improved mobility and reduced fall risk.    Baseline  52.88 secs - 11-11-18; 47.5 secs with RW on 04-07-19    Time  4    Period  Weeks    Status  New    Target Date  05/07/19      PT SHORT TERM GOAL #2   Title  Pt will increase gait velocity from .71 ft/sec with RW to >/= 1.2 ft/sec with use of RW for incr. gait efficiency.    Baseline  21.66 secs = 1.51 ft/sec with RW - 11-11-18; 45.97 secs = .71 ft/sec with RW on 04-07-19 ; 1" 1 sec initial trial    Time  4    Period  Weeks    Status  New    Target Date  05/07/19      PT SHORT TERM GOAL #3   Title  Perform sit to stand with no UE support and stabilize independently (without bracing LE's against mat table) x 5 reps to demo improved LE strength.    Baseline  Pt braced legs on reps 1 and 2 of 5 reps on 04-07-19    Time  4    Period  Weeks    Status  New    Target Date  05/07/19      PT SHORT TERM GOAL #4   Title  Amb. 115' with Lofstrand crutches  with CGA on flat, even surface.    Baseline  pt using RW for assist. with amb.    Time  4    Period  Weeks    Status  New    Target Date  05/07/19      PT SHORT TERM GOAL #5   Title  Independent in updated HEP including aquatic exercise program.    Time  4    Period  Weeks    Status  New    Target Date  05/07/19        PT Long Term Goals - 04/21/19 2113      PT LONG TERM  GOAL #1   Title  Improve TUG score to </= 35 secs  with RW for reduced fall risk.    Baseline  52.88 secs with RW on 11-11-18; 44.7 secs with 1 Lofstrand crutch on 01-11-19; 47.5 secs    Time  8    Period  Weeks    Status  New      PT LONG TERM GOAL #2   Title  Patient will improve gait velocity to >/= 1.5 ft/sec with Rt AFO and RW, demonstrating lesser fall risk    Baseline  21.66 secs = 1.51 ft/sec with RW;  25.6 secs = 1.28 ft/sec with 1 Lofstrand crutch - 01-12-19; 45.97 secs = .71 ft/sec ft/sec with RW on 04-07-19    Time  8    Period  Weeks    Status  New      PT LONG TERM GOAL #3   Title  Negotiate 4 steps with 1 hand rail using a step by step sequence.    Baseline  step by step sequence with use of hand rail and with 1 Lofstrand crutch - 01-11-19    Time  8    Period  Weeks    Status  New      PT LONG TERM GOAL #4   Title  Modified independent household amb. with use of 1 Lofstrand crutch (with use of AFO on RLE).    Baseline  not met 01-12-19    Time  8    Period  Weeks    Status  New            Plan - 04/21/19 2110    Clinical Impression Statement  Pt tolerated aquatic therapy well - RLE has increased tone/spasticity which results in difficulty in performing active knee flexion in swing phase of gait; pt required frequent short rest breaks due to fatigue with aquatic exercise    Rehab Potential  Good    Clinical Impairments Affecting Rehab Potential  decr safety awareness with pattern of falls due to poor judgement (walking without AFO, walking without RW, sitting on rolling chair)    PT Frequency  2x / week    PT Duration  8 weeks    PT Treatment/Interventions  Manual techniques;Patient/family education;Therapeutic exercise;Therapeutic activities;Gait training;ADLs/Self Care Home Management;Neuromuscular re-education;Functional mobility training;Passive range of motion;Balance training;Aquatic Therapy;Biofeedback;DME Instruction;Stair training;Orthotic Fit/Training;Dry needling    PT Next Visit Plan  cont ther ex for RLE  strengthening    PT Home Exercise Plan  seated ab sets and ball squeezes; leg lengthener and leg presses , standing static balance without UE; added runner's stretch and heel cord stretch to HEP    Consulted and Agree with Plan of Care  Patient       Patient will benefit from skilled therapeutic intervention in order to improve the following deficits and impairments:  Pain, Postural dysfunction, Difficulty walking, Decreased strength, Decreased activity tolerance, Abnormal gait, Increased muscle spasms, Decreased endurance, Decreased balance, Decreased coordination, Decreased knowledge of use of DME, Decreased mobility, Decreased safety awareness, Impaired sensation, Impaired tone  Visit Diagnosis: 1. Other abnormalities of gait and mobility   2. Muscle weakness (generalized)   3. Unsteadiness on feet        Problem List Patient Active Problem List   Diagnosis Date Noted  . Lumbar spinal stenosis   . Radicular pain   . Gait disorder   . Difficulty in urination 07/13/2018  . Back pain 07/12/2018  . Hyperlipidemia 04/16/2018  . Generalized anxiety disorder  with panic attacks 04/16/2018  . AKI (acute kidney injury) ([place])   . Benign essential HTN   . Depression with anxiety   . Lumbar radiculopathy 03/09/2018  . Paraparesis ([place]) 03/09/2018  . Essential hypertension   . Chronic nonintractable headache   . Leukocytosis   . Acute blood loss anemia   . Postoperative pain   . Generalized weakness   . Spinal stenosis at L4-L5 level 03/04/2018  . Spinal stenosis of lumbar region 12/28/2015  . Chest pain 04/16/2015    Cordel Drewes, [place], PT, [place] 04/21/2019, 9:15 PM  [facility] [address] [place], [name], [address] Phone: [phone]   Fax:  [phone]  Name: [name] MRN: [id number] Date of Birth: [birth year]

## 2019-04-22 NOTE — Therapy (Signed)
[facility] [address] [place], [name], [address] Phone: [phone]   Fax:  [phone]  Physical Therapy Treatment  Patient Details  Name: [name] MRN: [id number] Date of Birth: [birth year] Referring Provider (PT): Meredith Staggers, MD  CLINIC OPERATION CHANGES: [facility] is open at lower capacity following universal masking, social distancing, and patient screening.  The patient's COVID risk of complications score is 1.  Encounter Date: 04/21/2019  PT End of Session - 04/22/19 1734    Visit Number  3   30 visits total   Number of Visits  15    Date for PT Re-Evaluation  06/07/19    Authorization Type  [employer] [facility]; ?worker's comp - email sent on 11-09-18 to insurance specialist asking to confirm which insurance is primary; pt reports now she has [employer] as of mid March 2020    Authorization - Number of Visits  30    PT Start Time  1602    PT Stop Time  1650    PT Time Calculation (min)  48 min    Activity Tolerance  Patient tolerated treatment well    Behavior During Therapy  WFL for tasks assessed/performed       Past Medical History:  Diagnosis Date  . Anxiety   . Back pain    related to spinal stenosis and disc problem, radiates down left buttocks to leg., weakness occ.  [name] Dyspnea   . Headache   . Hyperlipidemia   . Hypertension   . PONV (postoperative nausea and vomiting)   . Vaginal foreign object    "Uses Femring"    Past Surgical History:  Procedure Laterality Date  . ABDOMINAL HYSTERECTOMY    . CARDIAC CATHETERIZATION N/A 04/18/2015   Procedure: Left Heart Cath and Coronary Angiography;  Surgeon: [name], MD;  Location: [facility];  Service: Cardiovascular;  Laterality: N/A;  . FOOT SURGERY Bilateral    [place] "bunion,bone spur, tendon" (1) -6'16, (1)-10'16  . IR EPIDUROGRAPHY  07/21/2018  . LUMBAR LAMINECTOMY/DECOMPRESSION  MICRODISCECTOMY Bilateral 12/28/2015   Procedure: MICRO LUMBAR DECOMPRESSION L4 - L5 BILATERALLY;  Surgeon: [name], MD;  Location: [facility];  Service: Orthopedics;  Laterality: Bilateral;  . LUMBAR LAMINECTOMY/DECOMPRESSION MICRODISCECTOMY Bilateral 03/04/2018   Procedure: Revision of Microlumbar Decompression Bilateral Lumbar Four-Five;  Surgeon: [name], MD;  Location: [facility];  Service: Orthopedics;  Laterality: Bilateral;  90 mins  . WISDOM TOOTH EXTRACTION    . WOUND EXPLORATION N/A 03/04/2018   Procedure: EXPLORATION OF LUMBAR DECOMPRESSION WOUND;  Surgeon: [name], MD;  Location: [facility];  Service: Orthopedics;  Laterality: N/A;    There were no vitals filed for this visit.  Subjective Assessment - 04/22/19 1726    Subjective  Pt reports the numbness and tingling in her hands is extremely painful - states she was crying in bed last night due to the pain    Patient is accompained by:  Family member    Pertinent History  decompression and laminectomy on 12/28/15 at L4-L5  with redo decompression surgery on May 9,2019, post-op paraparesis with surgery to remove small hematoma; 07/12/18 fall at work (involved rolling chair) with hospital admission, transfer to [facility] with d/c home 08/03/18; HTN, Bil foot surgery     Patient Stated Goals  improve walking    Currently in Pain?  Yes    Pain Score  6     Pain Location  Hand  Pain Orientation  Right;Left    Pain Descriptors / Indicators  Tingling;Pins and needles;Numbness    Pain Type  Neuropathic pain    Pain Onset  1 to 4 weeks ago    Pain Frequency  Constant                       [facility] Adult PT Treatment/Exercise - 04/22/19 0001      Transfers   Transfers  Sit to Stand    Number of Reps  Other reps (comment)   2   Comments  with UE support prn      Knee/Hip Exercises: Aerobic   Recumbent Bike  SciFit level 2.5 x 10" - LE's only      Knee/Hip Exercises: Machines for Strengthening   Cybex Leg Press  40# 3 sets  10 reps:  25# RLE only 15 reps      Knee/Hip Exercises: Standing   Heel Raises  Both;1 set;10 reps;1 second   with bil. UE support    Forward Step Up  Right;Left;1 set;10 reps;Hand Hold: 2;Step Height: 6"    Rocker Board  1 minute   anterior/posterior with UE support on // bars      Knee/Hip Exercises: Seated   Knee/Hip Flexion  10 reps each leg in seated position - no resistance     Hamstring Curl  Strengthening;Right;1 set;10 reps;Left   red theraband;  3 sec hold              PT Short Term Goals - 04/22/19 1738      PT SHORT TERM GOAL #1   Title  Improve TUG score from  47.5 secs to </= 41 secs with use of RW to demo improved mobility and reduced fall risk.    Baseline  52.88 secs - 11-11-18; 47.5 secs with RW on 04-07-19    Time  4    Period  Weeks    Status  New    Target Date  05/07/19      PT SHORT TERM GOAL #2   Title  Pt will increase gait velocity from .71 ft/sec with RW to >/= 1.2 ft/sec with use of RW for incr. gait efficiency.    Baseline  21.66 secs = 1.51 ft/sec with RW - 11-11-18; 45.97 secs = .71 ft/sec with RW on 04-07-19 ; 1" 1 sec initial trial    Time  4    Period  Weeks    Status  New    Target Date  05/07/19      PT SHORT TERM GOAL #3   Title  Perform sit to stand with no UE support and stabilize independently (without bracing LE's against mat table) x 5 reps to demo improved LE strength.    Baseline  Pt braced legs on reps 1 and 2 of 5 reps on 04-07-19    Time  4    Period  Weeks    Status  New    Target Date  05/07/19      PT SHORT TERM GOAL #4   Title  Amb. 115' with Lofstrand crutches  with CGA on flat, even surface.    Baseline  pt using RW for assist. with amb.    Time  4    Period  Weeks    Status  New    Target Date  05/07/19      PT SHORT TERM GOAL #5   Title  Independent in updated HEP including aquatic  exercise program.    Time  4    Period  Weeks    Status  New    Target Date  05/07/19        PT Long Term Goals -  04/22/19 1738      PT LONG TERM GOAL #1   Title  Improve TUG score to </= 35 secs with RW for reduced fall risk.    Baseline  52.88 secs with RW on 11-11-18; 44.7 secs with 1 Lofstrand crutch on 01-11-19; 47.5 secs    Time  8    Period  Weeks    Status  New      PT LONG TERM GOAL #2   Title  Patient will improve gait velocity to >/= 1.5 ft/sec with Rt AFO and RW, demonstrating lesser fall risk    Baseline  21.66 secs = 1.51 ft/sec with RW;  25.6 secs = 1.28 ft/sec with 1 Lofstrand crutch - 01-12-19; 45.97 secs = .71 ft/sec ft/sec with RW on 04-07-19    Time  8    Period  Weeks    Status  New      PT LONG TERM GOAL #3   Title  Negotiate 4 steps with 1 hand rail using a step by step sequence.    Baseline  step by step sequence with use of hand rail and with 1 Lofstrand crutch - 01-11-19    Time  8    Period  Weeks    Status  New      PT LONG TERM GOAL #4   Title  Modified independent household amb. with use of 1 Lofstrand crutch (with use of AFO on RLE).    Baseline  not met 01-12-19    Time  8    Period  Weeks    Status  New            Plan - 04/22/19 1736    Clinical Impression Statement  Pt presents with increased tone and spasticity in RLE with c/o severe numbness and tingling in bil. hands, with Rt hand worse than Lt hand.  Pt had difficulty holding onto hand rails at steps during step up exercise, fearing that hands were going to slip with the tight grip needed to peform this execise.    Rehab Potential  Good    Clinical Impairments Affecting Rehab Potential  decr safety awareness with pattern of falls due to poor judgement (walking without AFO, walking without RW, sitting on rolling chair)    PT Frequency  2x / week    PT Duration  8 weeks    PT Treatment/Interventions  Manual techniques;Patient/family education;Therapeutic exercise;Therapeutic activities;Gait training;ADLs/Self Care Home Management;Neuromuscular re-education;Functional mobility training;Passive range of  motion;Balance training;Aquatic Therapy;Biofeedback;DME Instruction;Stair training;Orthotic Fit/Training;Dry needling    PT Next Visit Plan  cont ther ex for RLE strengthening;  cont aquatic therapy    PT Home Exercise Plan  seated ab sets and ball squeezes; leg lengthener and leg presses , standing static balance without UE; added runner's stretch and heel cord stretch to HEP    Consulted and Agree with Plan of Care  Patient       Patient will benefit from skilled therapeutic intervention in order to improve the following deficits and impairments:  Pain, Postural dysfunction, Difficulty walking, Decreased strength, Decreased activity tolerance, Abnormal gait, Increased muscle spasms, Decreased endurance, Decreased balance, Decreased coordination, Decreased knowledge of use of DME, Decreased mobility, Decreased safety awareness, Impaired sensation, Impaired tone  Visit Diagnosis: 1.  Other abnormalities of gait and mobility   2. Muscle weakness (generalized)   3. Unsteadiness on feet        Problem List Patient Active Problem List   Diagnosis Date Noted  . Lumbar spinal stenosis   . Radicular pain   . Gait disorder   . Difficulty in urination 07/13/2018  . Back pain 07/12/2018  . Hyperlipidemia 04/16/2018  . Generalized anxiety disorder with panic attacks 04/16/2018  . AKI (acute kidney injury) ([place])   . Benign essential HTN   . Depression with anxiety   . Lumbar radiculopathy 03/09/2018  . Paraparesis ([place]) 03/09/2018  . Essential hypertension   . Chronic nonintractable headache   . Leukocytosis   . Acute blood loss anemia   . Postoperative pain   . Generalized weakness   . Spinal stenosis at L4-L5 level 03/04/2018  . Spinal stenosis of lumbar region 12/28/2015  . Chest pain 04/16/2015    Alda Lea, PT 04/22/2019, 5:39 PM  [facility] [address] [place], [name], [address] Phone: [phone]    Fax:  [phone]  Name: [name] MRN: [id number] Date of Birth: Nov 23, 1960

## 2019-04-25 MED ORDER — DIAZEPAM 5 MG PO TABS: 5.0000 mg | ORAL_TABLET | Freq: Once | ORAL | 0 refills | Status: AC

## 2019-04-25 MED FILL — diazePAM 5 MG TABS: 5 | 1 days supply | Qty: 2 | Fill #0

## 2019-04-27 ENCOUNTER — Ambulatory Visit: Payer: Self-pay | Attending: Specialist | Admitting: Physical Therapy

## 2019-04-27 ENCOUNTER — Other Ambulatory Visit: Payer: Self-pay

## 2019-04-27 DIAGNOSIS — R2681 Unsteadiness on feet: Secondary | ICD-10-CM | POA: Insufficient documentation

## 2019-04-27 DIAGNOSIS — R2689 Other abnormalities of gait and mobility: Secondary | ICD-10-CM | POA: Insufficient documentation

## 2019-04-27 DIAGNOSIS — M6281 Muscle weakness (generalized): Secondary | ICD-10-CM | POA: Insufficient documentation

## 2019-04-27 NOTE — Therapy (Signed)
[facility] [address] [place], [name], [address] Phone: [phone]   Fax:  [phone]  Physical Therapy Treatment  Patient Details  Name: [name] MRN: [id number] Date of Birth: 10/04/1961 Referring Provider (PT): Meredith Staggers, MD   Encounter Date: 04/27/2019  PT End of Session - 04/27/19 1947    Visit Number  4   30 visits total   Number of Visits  15    Date for PT Re-Evaluation  06/07/19    Authorization Type  [employer] [facility]; ?worker's comp - email sent on 11-09-18 to insurance specialist asking to confirm which insurance is primary; pt reports now she has [employer] as of mid March 2020    Authorization - Number of Visits  30    PT Start Time  1305    PT Stop Time  1350    PT Time Calculation (min)  45 min    Equipment Utilized During Treatment  Other (comment)   pool noodle and buoyant ankle cuff   Activity Tolerance  Patient tolerated treatment well    Behavior During Therapy  [facility] for tasks assessed/performed       Past Medical History:  Diagnosis Date  . Anxiety   . Back pain    related to spinal stenosis and disc problem, radiates down left buttocks to leg., weakness occ.  [name] Dyspnea   . Headache   . Hyperlipidemia   . Hypertension   . PONV (postoperative nausea and vomiting)   . Vaginal foreign object    "Uses Femring"    Past Surgical History:  Procedure Laterality Date  . ABDOMINAL HYSTERECTOMY    . CARDIAC CATHETERIZATION N/A 04/18/2015   Procedure: Left Heart Cath and Coronary Angiography;  Surgeon: [name], MD;  Location: [facility];  Service: Cardiovascular;  Laterality: N/A;  . FOOT SURGERY Bilateral    [place] "bunion,bone spur, tendon" (1) -6'16, (1)-10'16  . IR EPIDUROGRAPHY  07/21/2018  . LUMBAR LAMINECTOMY/DECOMPRESSION MICRODISCECTOMY Bilateral 12/28/2015   Procedure: MICRO LUMBAR DECOMPRESSION L4 - L5 BILATERALLY;  Surgeon: [name], MD;  Location[facility];  Service: Orthopedics;  Laterality: Bilateral;  . LUMBAR LAMINECTOMY/DECOMPRESSION MICRODISCECTOMY Bilateral 03/04/2018   Procedure: Revision of Microlumbar Decompression Bilateral Lumbar Four-Five;  Surgeon: [name], MD;  Location: [facility];  Service: Orthopedics;  Laterality: Bilateral;  90 mins  . WISDOM TOOTH EXTRACTION    . WOUND EXPLORATION N/A 03/04/2018   Procedure: EXPLORATION OF LUMBAR DECOMPRESSION WOUND;  Surgeon: [name], MD;  Location: [facility];  Service: Orthopedics;  Laterality: N/A;    There were no vitals filed for this visit.  Subjective Assessment - 04/27/19 1945    Subjective  Pt presents for aquatic therapy at [facility] - continues to c/o numbness and tingling in hands, with Rt hand worse than Lt hand    Patient is accompained by:  Family member    Pertinent History  decompression and laminectomy on 12/28/15 at L4-L5  with redo decompression surgery on May 9,2019, post-op paraparesis with surgery to remove small hematoma; 07/12/18 fall at work (involved rolling chair) with hospital admission, transfer to [facility] with d/c home 08/03/18; HTN, Bil foot surgery     Patient Stated Goals  improve walking    Currently in Pain?  Yes    Pain Score  5     Pain Location  Hand    Pain Orientation  Right;Left    Pain Descriptors / Indicators  Numbness;Tingling;Pins  and needles    Pain Type  Neuropathic pain    Pain Onset  1 to 4 weeks ago    Pain Frequency  Constant        Aquatic therapy at [facility] - pool temp 87.4 degrees   Patient seen for aquatic therapy today.  Treatment took place in water 3.5-4 feet deep depending upon activity.  Pt entered and  Exited the pool via step negotiation with use of bilateral hand rails with CGA for safety.  Pt performed runner's stretch on each leg and heel cord stretch bil. LE's with feet on pool wall - 30 sec hold each stretch x 1 rep Pt performed amb. [place] pool 1 rep - forward, backward and side with use of noodle  for UE support  Pt performed standing hip flexion, abduction and hip extension with knee extended -  10 reps each leg with UE support on pool edge for assist with balance; buoyant  Ankle cuff used on LLE only - attempted to use cuff on RLE but pt reported she felt discomfort in her low back with active flexion/extension with use of ankle cuff; cuff removed and pt performed hip flexon/extension and abduction on RLE without use of cuff  Marching in place 10 reps each leg with UE support   Squats x 10 reps with bil. UE support on pool edge   Pt requires aquatic therapy for the unweighting provided by the buoyancy of the water and needs the viscosity for strengthening LE's and trunk musc.; current of water provided perturbations for balance challenges and for trunk stabilization                           PT Short Term Goals - 04/27/19 1954      PT SHORT TERM GOAL #1   Title  Improve TUG score from  47.5 secs to </= 41 secs with use of RW to demo improved mobility and reduced fall risk.    Baseline  52.88 secs - 11-11-18; 47.5 secs with RW on 04-07-19    Time  4    Period  Weeks    Status  New    Target Date  05/07/19      PT SHORT TERM GOAL #2   Title  Pt will increase gait velocity from .71 ft/sec with RW to >/= 1.2 ft/sec with use of RW for incr. gait efficiency.    Baseline  21.66 secs = 1.51 ft/sec with RW - 11-11-18; 45.97 secs = .71 ft/sec with RW on 04-07-19 ; 1" 1 sec initial trial    Time  4    Period  Weeks    Status  New    Target Date  05/07/19      PT SHORT TERM GOAL #3   Title  Perform sit to stand with no UE support and stabilize independently (without bracing LE's against mat table) x 5 reps to demo improved LE strength.    Baseline  Pt braced legs on reps 1 and 2 of 5 reps on 04-07-19    Time  4    Period  Weeks    Status  New    Target Date  05/07/19      PT SHORT TERM GOAL #4   Title  Amb. 115' with Lofstrand crutches  with CGA on flat, even  surface.    Baseline  pt using RW for assist. with amb.    Time  4  Period  Weeks    Status  New    Target Date  05/07/19      PT SHORT TERM GOAL #5   Title  Independent in updated HEP including aquatic exercise program.    Time  4    Period  Weeks    Status  New    Target Date  05/07/19        PT Long Term Goals - 04/27/19 1954      PT LONG TERM GOAL #1   Title  Improve TUG score to </= 35 secs with RW for reduced fall risk.    Baseline  52.88 secs with RW on 11-11-18; 44.7 secs with 1 Lofstrand crutch on 01-11-19; 47.5 secs    Time  8    Period  Weeks    Status  New      PT LONG TERM GOAL #2   Title  Patient will improve gait velocity to >/= 1.5 ft/sec with Rt AFO and RW, demonstrating lesser fall risk    Baseline  21.66 secs = 1.51 ft/sec with RW;  25.6 secs = 1.28 ft/sec with 1 Lofstrand crutch - 01-12-19; 45.97 secs = .71 ft/sec ft/sec with RW on 04-07-19    Time  8    Period  Weeks    Status  New      PT LONG TERM GOAL #3   Title  Negotiate 4 steps with 1 hand rail using a step by step sequence.    Baseline  step by step sequence with use of hand rail and with 1 Lofstrand crutch - 01-11-19    Time  8    Period  Weeks    Status  New      PT LONG TERM GOAL #4   Title  Modified independent household amb. with use of 1 Lofstrand crutch (with use of AFO on RLE).    Baseline  not met 01-12-19    Time  8    Period  Weeks    Status  New            Plan - 04/27/19 1949    Clinical Impression Statement  Aquatic therapy focused on ambulation in 4' water depth with use of noodle for UE support - cues to flex Rt knee as much as possible (difficult due to extensor tone/spasticity);  pt stated forward amb. felt good ; pt had more diffculty with backward amb. due to decreased balance with this activity    Rehab Potential  Good    Clinical Impairments Affecting Rehab Potential  decr safety awareness with pattern of falls due to poor judgement (walking without AFO, walking  without RW, sitting on rolling chair)    PT Frequency  2x / week    PT Duration  8 weeks    PT Treatment/Interventions  Manual techniques;Patient/family education;Therapeutic exercise;Therapeutic activities;Gait training;ADLs/Self Care Home Management;Neuromuscular re-education;Functional mobility training;Passive range of motion;Balance training;Aquatic Therapy;Biofeedback;DME Instruction;Stair training;Orthotic Fit/Training;Dry needling    PT Next Visit Plan  cont ther ex for RLE strengthening;  cont aquatic therapy    PT Home Exercise Plan  seated ab sets and ball squeezes; leg lengthener and leg presses , standing static balance without UE; added runner's stretch and heel cord stretch to HEP    Consulted and Agree with Plan of Care  Patient       Patient will benefit from skilled therapeutic intervention in order to improve the following deficits and impairments:  Pain, Postural dysfunction, Difficulty walking, Decreased strength,  Decreased activity tolerance, Abnormal gait, Increased muscle spasms, Decreased endurance, Decreased balance, Decreased coordination, Decreased knowledge of use of DME, Decreased mobility, Decreased safety awareness, Impaired sensation, Impaired tone  Visit Diagnosis: 1. Other abnormalities of gait and mobility   2. Muscle weakness (generalized)   3. Unsteadiness on feet        Problem List Patient Active Problem List   Diagnosis Date Noted  . Lumbar spinal stenosis   . Radicular pain   . Gait disorder   . Difficulty in urination 07/13/2018  . Back pain 07/12/2018  . Hyperlipidemia 04/16/2018  . Generalized anxiety disorder with panic attacks 04/16/2018  . AKI (acute kidney injury) ([place])   . Benign essential HTN   . Depression with anxiety   . Lumbar radiculopathy 03/09/2018  . Paraparesis ([place]) 03/09/2018  . Essential hypertension   . Chronic nonintractable headache   . Leukocytosis   . Acute blood loss anemia   . Postoperative pain   .  Generalized weakness   . Spinal stenosis at L4-L5 level 03/04/2018  . Spinal stenosis of lumbar region 12/28/2015  . Chest pain 04/16/2015    Huie Ghuman, [place], PT, [place] 04/27/2019, 7:55 PM  [facility] [address] [place], [name], [address] Phone: [phone]   Fax:  [phone]  Name: [name] MRN: [id number] Date of Birth: 01-27-1961

## 2019-05-02 ENCOUNTER — Ambulatory Visit: Payer: Self-pay | Admitting: Physical Therapy

## 2019-05-02 ENCOUNTER — Other Ambulatory Visit: Payer: Self-pay

## 2019-05-02 DIAGNOSIS — R2689 Other abnormalities of gait and mobility: Secondary | ICD-10-CM

## 2019-05-02 DIAGNOSIS — M6281 Muscle weakness (generalized): Secondary | ICD-10-CM

## 2019-05-03 NOTE — Therapy (Signed)
[facility] [address] [place], [name], [address] Phone: [phone]   Fax:  [phone]  Physical Therapy Treatment  Patient Details  Name: [name] MRN: [id number] Date of Birth: [birth year] Referring Provider (PT): Meredith Staggers, MD   Encounter Date: 05/02/2019  PT End of Session - 05/03/19 2200    Visit Number  5   30 visits total   Number of Visits  15    Date for PT Re-Evaluation  06/07/19    Authorization Type  [facility]; ?worker's comp - email sent on 11-09-18 to insurance specialist asking to confirm which insurance is primary; pt reports now she has [employer] as of mid March 2020    Authorization - Number of Visits  30    PT Start Time  1100    PT Stop Time  1150    PT Time Calculation (min)  50 min    Equipment Utilized During Treatment  Other (comment)   pool noodle and buoyant ankle cuff   Activity Tolerance  Patient tolerated treatment well    Behavior During Therapy  [facility] for tasks assessed/performed       Past Medical History:  Diagnosis Date  . Anxiety   . Back pain    related to spinal stenosis and disc problem, radiates down left buttocks to leg., weakness occ.  [name] Dyspnea   . Headache   . Hyperlipidemia   . Hypertension   . PONV (postoperative nausea and vomiting)   . Vaginal foreign object    "Uses Femring"    Past Surgical History:  Procedure Laterality Date  . ABDOMINAL HYSTERECTOMY    . CARDIAC CATHETERIZATION N/A 04/18/2015   Procedure: Left Heart Cath and Coronary Angiography;  Surgeon: [name], MD;  Location: [facility] [facility];  Service: Cardiovascular;  Laterality: N/A;  . FOOT SURGERY Bilateral    [place] "bunion,bone spur, tendon" (1) -6'16, (1)-10'16  . IR EPIDUROGRAPHY  07/21/2018  . LUMBAR LAMINECTOMY/DECOMPRESSION MICRODISCECTOMY Bilateral 12/28/2015   Procedure: MICRO LUMBAR DECOMPRESSION L4 - L5 BILATERALLY;  Surgeon: [name], MD;  Location: [facility];  Service: Orthopedics;  Laterality: Bilateral;  . LUMBAR LAMINECTOMY/DECOMPRESSION MICRODISCECTOMY Bilateral 03/04/2018   Procedure: Revision of Microlumbar Decompression Bilateral Lumbar Four-Five;  Surgeon: [name], MD;  Location: [facility];  Service: Orthopedics;  Laterality: Bilateral;  90 mins  . WISDOM TOOTH EXTRACTION    . WOUND EXPLORATION N/A 03/04/2018   Procedure: EXPLORATION OF LUMBAR DECOMPRESSION WOUND;  Surgeon: [name], MD;  Location: [facility];  Service: Orthopedics;  Laterality: N/A;    There were no vitals filed for this visit.  Subjective Assessment - 05/03/19 2158    Subjective  Pt presents for aquatic therapy at [facility] - continues to c/o numbness and tingling in hands, with Rt hand worse than Lt hand - states it is getting worse    Patient is accompained by:  Family member    Pertinent History  decompression and laminectomy on 12/28/15 at L4-L5  with redo decompression surgery on May 9,2019, post-op paraparesis with surgery to remove small hematoma; 07/12/18 fall at work (involved rolling chair) with hospital admission, transfer to [place] with d/c home 08/03/18; HTN, Bil foot surgery     Patient Stated Goals  improve walking    Currently in Pain?  Yes    Pain Score  7     Pain Location  Hand    Pain Orientation  Right;Left  Pain Descriptors / Indicators  Tingling;Pins and needles;Numbness    Pain Type  Neuropathic pain    Pain Onset  1 to 4 weeks ago    Pain Frequency  Constant                                 PT Short Term Goals - 05/03/19 2201      PT SHORT TERM GOAL #1   Title  Improve TUG score from  47.5 secs to </= 41 secs with use of RW to demo improved mobility and reduced fall risk.    Baseline  52.88 secs - 11-11-18; 47.5 secs with RW on 04-07-19    Time  4    Period  Weeks    Status  New    Target Date  05/07/19      PT SHORT TERM GOAL #2   Title  Pt will increase gait velocity from .71 ft/sec with RW  to >/= 1.2 ft/sec with use of RW for incr. gait efficiency.    Baseline  21.66 secs = 1.51 ft/sec with RW - 11-11-18; 45.97 secs = .71 ft/sec with RW on 04-07-19 ; 1" 1 sec initial trial    Time  4    Period  Weeks    Status  New    Target Date  05/07/19      PT SHORT TERM GOAL #3   Title  Perform sit to stand with no UE support and stabilize independently (without bracing LE's against mat table) x 5 reps to demo improved LE strength.    Baseline  Pt braced legs on reps 1 and 2 of 5 reps on 04-07-19    Time  4    Period  Weeks    Status  New    Target Date  05/07/19      PT SHORT TERM GOAL #4   Title  Amb. 115' with Lofstrand crutches  with CGA on flat, even surface.    Baseline  pt using RW for assist. with amb.    Time  4    Period  Weeks    Status  New    Target Date  05/07/19      PT SHORT TERM GOAL #5   Title  Independent in updated HEP including aquatic exercise program.    Time  4    Period  Weeks    Status  New    Target Date  05/07/19        PT Long Term Goals - 05/03/19 2201      PT LONG TERM GOAL #1   Title  Improve TUG score to </= 35 secs with RW for reduced fall risk.    Baseline  52.88 secs with RW on 11-11-18; 44.7 secs with 1 Lofstrand crutch on 01-11-19; 47.5 secs    Time  8    Period  Weeks    Status  New      PT LONG TERM GOAL #2   Title  Patient will improve gait velocity to >/= 1.5 ft/sec with Rt AFO and RW, demonstrating lesser fall risk    Baseline  21.66 secs = 1.51 ft/sec with RW;  25.6 secs = 1.28 ft/sec with 1 Lofstrand crutch - 01-12-19; 45.97 secs = .71 ft/sec ft/sec with RW on 04-07-19    Time  8    Period  Weeks    Status  New  PT LONG TERM GOAL #3   Title  Negotiate 4 steps with 1 hand rail using a step by step sequence.    Baseline  step by step sequence with use of hand rail and with 1 Lofstrand crutch - 01-11-19    Time  8    Period  Weeks    Status  New      PT LONG TERM GOAL #4   Title  Modified independent household amb. with  use of 1 Lofstrand crutch (with use of AFO on RLE).    Baseline  not met 01-12-19    Time  8    Period  Weeks    Status  New            Plan - 05/03/19 2200    Rehab Potential  Good    Clinical Impairments Affecting Rehab Potential  decr safety awareness with pattern of falls due to poor judgement (walking without AFO, walking without RW, sitting on rolling chair)    PT Frequency  2x / week    PT Duration  8 weeks    PT Treatment/Interventions  Manual techniques;Patient/family education;Therapeutic exercise;Therapeutic activities;Gait training;ADLs/Self Care Home Management;Neuromuscular re-education;Functional mobility training;Passive range of motion;Balance training;Aquatic Therapy;Biofeedback;DME Instruction;Stair training;Orthotic Fit/Training;Dry needling    PT Next Visit Plan  cont ther ex for RLE strengthening;  cont aquatic therapy    PT Home Exercise Plan  seated ab sets and ball squeezes; leg lengthener and leg presses , standing static balance without UE; added runner's stretch and heel cord stretch to HEP    Consulted and Agree with Plan of Care  Patient       Patient will benefit from skilled therapeutic intervention in order to improve the following deficits and impairments:  Pain, Postural dysfunction, Difficulty walking, Decreased strength, Decreased activity tolerance, Abnormal gait, Increased muscle spasms, Decreased endurance, Decreased balance, Decreased coordination, Decreased knowledge of use of DME, Decreased mobility, Decreased safety awareness, Impaired sensation, Impaired tone  Visit Diagnosis: 1. Other abnormalities of gait and mobility   2. Muscle weakness (generalized)        Problem List Patient Active Problem List   Diagnosis Date Noted  . Lumbar spinal stenosis   . Radicular pain   . Gait disorder   . Difficulty in urination 07/13/2018  . Back pain 07/12/2018  . Hyperlipidemia 04/16/2018  . Generalized anxiety disorder with panic attacks  04/16/2018  . AKI (acute kidney injury) ([place])   . Benign essential HTN   . Depression with anxiety   . Lumbar radiculopathy 03/09/2018  . Paraparesis ([place]) 03/09/2018  . Essential hypertension   . Chronic nonintractable headache   . Leukocytosis   . Acute blood loss anemia   . Postoperative pain   . Generalized weakness   . Spinal stenosis at L4-L5 level 03/04/2018  . Spinal stenosis of lumbar region 12/28/2015  . Chest pain 04/16/2015    Alda Lea, PT 05/03/2019, 10:03 PM  [facility] [address] [place], [name], [address] Phone: [phone]   Fax:  [phone]  Name: EZABELLA TESKA MRN: [id number] Date of Birth: [birth year]

## 2019-05-05 DIAGNOSIS — M5416 Radiculopathy, lumbar region: Secondary | ICD-10-CM | POA: Diagnosis not present

## 2019-05-05 DIAGNOSIS — M961 Postlaminectomy syndrome, not elsewhere classified: Secondary | ICD-10-CM | POA: Diagnosis not present

## 2019-05-11 ENCOUNTER — Other Ambulatory Visit: Payer: Self-pay

## 2019-05-11 ENCOUNTER — Ambulatory Visit
Admission: RE | Admit: 2019-05-11 | Discharge: 2019-05-11 | Disposition: A | Discharge status: Home / Self Care | Payer: BC Managed Care – PPO | Source: Ambulatory Visit | Attending: Family Medicine | Admitting: Family Medicine

## 2019-05-11 ENCOUNTER — Telehealth: Payer: Self-pay | Admitting: Family Medicine

## 2019-05-11 DIAGNOSIS — R202 Paresthesia of skin: Secondary | ICD-10-CM

## 2019-05-11 DIAGNOSIS — M542 Cervicalgia: Secondary | ICD-10-CM

## 2019-05-11 DIAGNOSIS — R200 Anesthesia of skin: Secondary | ICD-10-CM

## 2019-05-11 NOTE — Telephone Encounter (Signed)
Pt called to report that she was unable to complete the MRI scheduled for her today.  She states that she was told it will have to be rescheduled in a hospital setting due to her claustrophobia and inability to complete the test.  Pt states she was dizzy and almost fell today during testing.  Please call pt to discuss rescheduling.    [phone]

## 2019-05-12 NOTE — Telephone Encounter (Signed)
Spoke with patient on 05/11/2019 and she stated that she will need to have MRI done at [facility]. Is it okay to redo referral for imaging to have done at hospital due to claustrophobia.

## 2019-05-13 ENCOUNTER — Other Ambulatory Visit: Payer: Self-pay | Admitting: *Deleted

## 2019-05-13 DIAGNOSIS — M542 Cervicalgia: Secondary | ICD-10-CM

## 2019-05-13 DIAGNOSIS — R202 Paresthesia of skin: Secondary | ICD-10-CM

## 2019-05-13 DIAGNOSIS — R200 Anesthesia of skin: Secondary | ICD-10-CM

## 2019-05-13 NOTE — Telephone Encounter (Signed)
Spoke with patient and informed her that new referral was placed to have MRI done at [facility] and that they will contact her to schedule. Patient verbalized understanding.  Nothing further needed at this time.

## 2019-05-13 NOTE — Telephone Encounter (Signed)
Returned call to patient and she stated that she will need Rx for DIAZEPAM 5 MG TABS sent to the pharmacy before she can have MRI done on 05/25/2019.

## 2019-05-13 NOTE — Telephone Encounter (Signed)
Pt calling to check status. Please advise  °

## 2019-05-13 NOTE — Telephone Encounter (Signed)
Pt stated she is claustrophobic and when she spoke with [facility] regarding the referral for MRI she was told that a Rx would need to be sent to her pharmacy. Pt requests call back.

## 2019-05-13 NOTE — Telephone Encounter (Signed)
I sent Rx for Diazepam to take before MRI and picked up 04/25/19. Is she having another MRI done? Is she still taking Alprazolam? Thanks, BJ

## 2019-05-16 NOTE — Telephone Encounter (Signed)
Patient is having another MRI done on 05/25/2019 because the other one could not be completed due to claustrophobia and needed an open exam table per previous notes. Patient will need new Rx sent in before new appointment. Patient still taking Xanax.

## 2019-05-18 ENCOUNTER — Ambulatory Visit: Payer: Self-pay | Admitting: Physical Therapy

## 2019-05-18 ENCOUNTER — Other Ambulatory Visit: Payer: Self-pay

## 2019-05-18 DIAGNOSIS — R2689 Other abnormalities of gait and mobility: Secondary | ICD-10-CM

## 2019-05-18 DIAGNOSIS — M6281 Muscle weakness (generalized): Secondary | ICD-10-CM

## 2019-05-19 DIAGNOSIS — M545 Low back pain: Secondary | ICD-10-CM | POA: Diagnosis not present

## 2019-05-19 DIAGNOSIS — M961 Postlaminectomy syndrome, not elsewhere classified: Secondary | ICD-10-CM | POA: Diagnosis not present

## 2019-05-19 DIAGNOSIS — I10 Essential (primary) hypertension: Secondary | ICD-10-CM | POA: Diagnosis not present

## 2019-05-19 NOTE — Therapy (Signed)
[facility] [address] [place], [name], [address] Phone: [phone]   Fax:  [phone]  Physical Therapy Treatment  Patient Details  Name: [name] MRN: [id number] Date of Birth: Jul 07, 1961 Referring Provider (PT): Meredith Staggers, MD   Encounter Date: 05/18/2019  PT End of Session - 05/19/19 1936    Visit Number  6   30 visits total   Number of Visits  15    Date for PT Re-Evaluation  06/07/19    Authorization Type  [facility]; ?worker's comp - email sent on 11-09-18 to insurance specialist asking to confirm which insurance is primary; pt reports now she has [employer] as of mid March 2020    Authorization - Visit Number  6    Authorization - Number of Visits  30    PT Start Time  1205    PT Stop Time  1252    PT Time Calculation (min)  47 min    Equipment Utilized During Treatment  Other (comment)   pool noodle and buoyant ankle cuff   Activity Tolerance  Patient tolerated treatment well    Behavior During Therapy  [facility] for tasks assessed/performed       Past Medical History:  Diagnosis Date  . Anxiety   . Back pain    related to spinal stenosis and disc problem, radiates down left buttocks to leg., weakness occ.  [name] Dyspnea   . Headache   . Hyperlipidemia   . Hypertension   . PONV (postoperative nausea and vomiting)   . Vaginal foreign object    "Uses Femring"    Past Surgical History:  Procedure Laterality Date  . ABDOMINAL HYSTERECTOMY    . CARDIAC CATHETERIZATION N/A 04/18/2015   Procedure: Left Heart Cath and Coronary Angiography;  Surgeon: [name], MD;  Location: [facility] [facility];  Service: Cardiovascular;  Laterality: N/A;  . FOOT SURGERY Bilateral    [place] "bunion,bone spur, tendon" (1) -6'16, (1)-10'16  . IR EPIDUROGRAPHY  07/21/2018  . LUMBAR LAMINECTOMY/DECOMPRESSION MICRODISCECTOMY Bilateral 12/28/2015   Procedure: MICRO LUMBAR DECOMPRESSION L4 - L5  BILATERALLY;  Surgeon: [name], MD;  Location[facility];  Service: Orthopedics;  Laterality: Bilateral;  . LUMBAR LAMINECTOMY/DECOMPRESSION MICRODISCECTOMY Bilateral 03/04/2018   Procedure: Revision of Microlumbar Decompression Bilateral Lumbar Four-Five;  Surgeon: [name], MD;  Location: [place];  Service: Orthopedics;  Laterality: Bilateral;  90 mins  . WISDOM TOOTH EXTRACTION    . WOUND EXPLORATION N/A 03/04/2018   Procedure: EXPLORATION OF LUMBAR DECOMPRESSION WOUND;  Surgeon: [name], MD;  Location: [facility] [place];  Service: Orthopedics;  Laterality: N/A;    There were no vitals filed for this visit.  Subjective Assessment - 05/19/19 1931    Subjective  Pt presents for aquatic therapy at [facility] - continues to c/o numbness and tingling in hands, with Rt hand worse than Lt hand; has MRI scheduled 05-25-19    Patient is accompained by:  Family member    Pertinent History  decompression and laminectomy on 12/28/15 at L4-L5  with redo decompression surgery on May 9,2019, post-op paraparesis with surgery to remove small hematoma; 07/12/18 fall at work (involved rolling chair) with hospital admission, transfer to [place] with d/c home 08/03/18; HTN, Bil foot surgery     Patient Stated Goals  improve walking    Pain Onset  1 to 4 weeks ago          Aquatic therapy at [facility] - pool  temp 87.4 degrees   Patient seen for aquatic therapy today.  Treatment took place in water 3.5-4 feet deep depending upon activity.  Pt entered and  Exited the pool via step negotiation with use of bilateral hand rails with CGA for safety.  Pt performed runner's stretch on each leg and heel cord stretch bil. LE's with feet on pool wall - 30 sec hold each stretch x 1 rep Pt performed amb. [address] pool 4 reps forward; backward 1 rep and sideways 1 rep with use of noodle for UE support with therapist holding noodle for stability   Pt performed standing hip flexion, abduction and hip extension with knee extended -  10 reps  each leg with UE support on pool edge for assist with balance;  Marching in place 10 reps each leg with UE support   Squats x 10 reps with bil. UE support on pool edge   Pt requires aquatic therapy for the unweighting provided by the buoyancy of the water and needs the viscosity for strengthening LE's and trunk musc.; current of water provided perturbations for balance challenges and for trunk stabilization                          PT Short Term Goals - 05/19/19 1946      PT SHORT TERM GOAL #1   Title  Improve TUG score from  47.5 secs to </= 41 secs with use of RW to demo improved mobility and reduced fall risk.    Baseline  52.88 secs - 11-11-18; 47.5 secs with RW on 04-07-19    Time  4    Period  Weeks    Status  New    Target Date  05/07/19      PT SHORT TERM GOAL #2   Title  Pt will increase gait velocity from .71 ft/sec with RW to >/= 1.2 ft/sec with use of RW for incr. gait efficiency.    Baseline  21.66 secs = 1.51 ft/sec with RW - 11-11-18; 45.97 secs = .71 ft/sec with RW on 04-07-19 ; 1" 1 sec initial trial    Time  4    Period  Weeks    Status  New    Target Date  05/07/19      PT SHORT TERM GOAL #3   Title  Perform sit to stand with no UE support and stabilize independently (without bracing LE's against mat table) x 5 reps to demo improved LE strength.    Baseline  Pt braced legs on reps 1 and 2 of 5 reps on 04-07-19    Time  4    Period  Weeks    Status  New    Target Date  05/07/19      PT SHORT TERM GOAL #4   Title  Amb. 115' with Lofstrand crutches  with CGA on flat, even surface.    Baseline  pt using RW for assist. with amb.    Time  4    Period  Weeks    Status  New    Target Date  05/07/19      PT SHORT TERM GOAL #5   Title  Independent in updated HEP including aquatic exercise program.    Time  4    Period  Weeks    Status  New    Target Date  05/07/19        PT Long Term Goals - 05/19/19 1946  PT LONG TERM GOAL #1    Title  Improve TUG score to </= 35 secs with RW for reduced fall risk.    Baseline  52.88 secs with RW on 11-11-18; 44.7 secs with 1 Lofstrand crutch on 01-11-19; 47.5 secs    Time  8    Period  Weeks    Status  New      PT LONG TERM GOAL #2   Title  Patient will improve gait velocity to >/= 1.5 ft/sec with Rt AFO and RW, demonstrating lesser fall risk    Baseline  21.66 secs = 1.51 ft/sec with RW;  25.6 secs = 1.28 ft/sec with 1 Lofstrand crutch - 01-12-19; 45.97 secs = .71 ft/sec ft/sec with RW on 04-07-19    Time  8    Period  Weeks    Status  New      PT LONG TERM GOAL #3   Title  Negotiate 4 steps with 1 hand rail using a step by step sequence.    Baseline  step by step sequence with use of hand rail and with 1 Lofstrand crutch - 01-11-19    Time  8    Period  Weeks    Status  New      PT LONG TERM GOAL #4   Title  Modified independent household amb. with use of 1 Lofstrand crutch (with use of AFO on RLE).    Baseline  not met 01-12-19    Time  8    Period  Weeks    Status  New            Plan - 05/19/19 1937    Clinical Impression Statement  Aquatic therapy session focused on gait training and LE strengthening; pt has difficulty with backwards ambulation but is able to amb. forwards with bil. UE support on pool noodle; pt able to perform hip strengthening exercises with UE support on pool edge.  Pt cont to c/o numbness and tingling in hands, but stated decreased pain in hands in the water due to compression force of the water.    Rehab Potential  Good    Clinical Impairments Affecting Rehab Potential  decr safety awareness with pattern of falls due to poor judgement (walking without AFO, walking without RW, sitting on rolling chair)    PT Frequency  2x / week    PT Duration  8 weeks    PT Treatment/Interventions  Manual techniques;Patient/family education;Therapeutic exercise;Therapeutic activities;Gait training;ADLs/Self Care Home Management;Neuromuscular  re-education;Functional mobility training;Passive range of motion;Balance training;Aquatic Therapy;Biofeedback;DME Instruction;Stair training;Orthotic Fit/Training;Dry needling    PT Next Visit Plan  Pt wishes to hold PT until results of MRI scheduled on 05-25-19 have been received as pt may need PT for neck/ UE's in the future    PT Home Exercise Plan  seated ab sets and ball squeezes; leg lengthener and leg presses , standing static balance without UE; added runner's stretch and heel cord stretch to HEP    Consulted and Agree with Plan of Care  Patient       Patient will benefit from skilled therapeutic intervention in order to improve the following deficits and impairments:  Pain, Postural dysfunction, Difficulty walking, Decreased strength, Decreased activity tolerance, Abnormal gait, Increased muscle spasms, Decreased endurance, Decreased balance, Decreased coordination, Decreased knowledge of use of DME, Decreased mobility, Decreased safety awareness, Impaired sensation, Impaired tone  Visit Diagnosis: 1. Other abnormalities of gait and mobility   2. Muscle weakness (generalized)  Problem List Patient Active Problem List   Diagnosis Date Noted  . Lumbar spinal stenosis   . Radicular pain   . Gait disorder   . Difficulty in urination 07/13/2018  . Back pain 07/12/2018  . Hyperlipidemia 04/16/2018  . Generalized anxiety disorder with panic attacks 04/16/2018  . AKI (acute kidney injury) ([place])   . Benign essential HTN   . Depression with anxiety   . Lumbar radiculopathy 03/09/2018  . Paraparesis ([place]) 03/09/2018  . Essential hypertension   . Chronic nonintractable headache   . Leukocytosis   . Acute blood loss anemia   . Postoperative pain   . Generalized weakness   . Spinal stenosis at L4-L5 level 03/04/2018  . Spinal stenosis of lumbar region 12/28/2015  . Chest pain 04/16/2015    Nate Common, [place], PT, [place] 05/19/2019, 7:47 PM  [facility] [address] [place], [name], [address] Phone: [phone]   Fax:  [phone]  Name: LUANNE KRZYZANOWSKI MRN: [id number] Date of Birth: 01/06/1961

## 2019-05-20 ENCOUNTER — Other Ambulatory Visit: Payer: Self-pay | Admitting: Family Medicine

## 2019-05-20 ENCOUNTER — Other Ambulatory Visit: Payer: Self-pay | Admitting: Specialist

## 2019-05-20 DIAGNOSIS — M545 Low back pain, unspecified: Secondary | ICD-10-CM

## 2019-05-23 NOTE — Telephone Encounter (Signed)
Prescription for diazepam should be already at her pharmacy. Thanks, BJ

## 2019-05-23 NOTE — Telephone Encounter (Signed)
Patient returning call, per pharmacy, they do not have RX.

## 2019-05-23 NOTE — Telephone Encounter (Signed)
Dr. [name] which pharmacy was this sent to

## 2019-05-24 ENCOUNTER — Other Ambulatory Visit: Payer: Self-pay | Admitting: Family Medicine

## 2019-05-24 ENCOUNTER — Ambulatory Visit: Payer: Self-pay | Admitting: Physical Therapy

## 2019-05-24 MED ORDER — DIAZEPAM 5 MG PO TABS: ORAL_TABLET | ORAL | 0 refills | Status: AC

## 2019-05-24 MED FILL — diazePAM 5 MG TABS: 5 | 1 days supply | Qty: 2 | Fill #0

## 2019-05-24 NOTE — Telephone Encounter (Signed)
Pt is calling and she is still waiting for diazepam to be sent [facility]. Please call pt back. Pt has mri tomorrow

## 2019-05-24 NOTE — Telephone Encounter (Signed)
Pt calling back and stated that medication is still not at the pharmacy. Please advise  [phone]

## 2019-05-24 NOTE — Telephone Encounter (Signed)
Pt aware.

## 2019-05-24 NOTE — Telephone Encounter (Signed)
Rx for Diazepam 5 mg sent to [facility] to take 1-2 tabs before MRI.  Thanks, BJ

## 2019-05-25 ENCOUNTER — Other Ambulatory Visit: Payer: Self-pay

## 2019-05-25 ENCOUNTER — Ambulatory Visit (HOSPITAL_COMMUNITY)
Admission: RE | Admit: 2019-05-25 | Discharge: 2019-05-25 | Disposition: A | Discharge status: Home / Self Care | Payer: BC Managed Care – PPO | Source: Ambulatory Visit | Attending: Family Medicine | Admitting: Family Medicine

## 2019-05-25 DIAGNOSIS — M2578 Osteophyte, vertebrae: Secondary | ICD-10-CM | POA: Diagnosis not present

## 2019-05-25 DIAGNOSIS — R202 Paresthesia of skin: Secondary | ICD-10-CM | POA: Insufficient documentation

## 2019-05-25 DIAGNOSIS — M545 Low back pain, unspecified: Secondary | ICD-10-CM

## 2019-05-25 DIAGNOSIS — M542 Cervicalgia: Secondary | ICD-10-CM | POA: Insufficient documentation

## 2019-05-25 DIAGNOSIS — R200 Anesthesia of skin: Secondary | ICD-10-CM | POA: Diagnosis not present

## 2019-05-25 DIAGNOSIS — M4802 Spinal stenosis, cervical region: Secondary | ICD-10-CM | POA: Diagnosis not present

## 2019-05-25 DIAGNOSIS — M47812 Spondylosis without myelopathy or radiculopathy, cervical region: Secondary | ICD-10-CM | POA: Diagnosis not present

## 2019-05-25 MED FILL — FEMRING 0.05 MG/24HR RING: 0.05 | 90 days supply | Qty: 1 | Fill #0

## 2019-05-31 ENCOUNTER — Other Ambulatory Visit: Payer: Self-pay | Admitting: Family Medicine

## 2019-05-31 DIAGNOSIS — Z8739 Personal history of other diseases of the musculoskeletal system and connective tissue: Secondary | ICD-10-CM

## 2019-05-31 DIAGNOSIS — R200 Anesthesia of skin: Secondary | ICD-10-CM

## 2019-05-31 NOTE — Progress Notes (Signed)
Ref. Hs been placed

## 2019-06-01 ENCOUNTER — Other Ambulatory Visit: Payer: Self-pay | Admitting: Family Medicine

## 2019-06-01 ENCOUNTER — Telehealth: Payer: Self-pay | Admitting: *Deleted

## 2019-06-01 ENCOUNTER — Ambulatory Visit: Payer: BC Managed Care – PPO | Attending: Specialist | Admitting: Physical Therapy

## 2019-06-01 ENCOUNTER — Other Ambulatory Visit: Payer: Self-pay

## 2019-06-01 DIAGNOSIS — M6281 Muscle weakness (generalized): Secondary | ICD-10-CM | POA: Diagnosis not present

## 2019-06-01 DIAGNOSIS — R2689 Other abnormalities of gait and mobility: Secondary | ICD-10-CM | POA: Insufficient documentation

## 2019-06-01 DIAGNOSIS — M5416 Radiculopathy, lumbar region: Secondary | ICD-10-CM

## 2019-06-01 DIAGNOSIS — M5410 Radiculopathy, site unspecified: Secondary | ICD-10-CM

## 2019-06-01 DIAGNOSIS — R2681 Unsteadiness on feet: Secondary | ICD-10-CM | POA: Insufficient documentation

## 2019-06-01 DIAGNOSIS — Z8739 Personal history of other diseases of the musculoskeletal system and connective tissue: Secondary | ICD-10-CM

## 2019-06-01 NOTE — Telephone Encounter (Signed)
Pt calling.  States she is very confused and would like to speak with someone again regarding testing and what she is supposed to be doing about cancelling and scheduling imaging. Pt can be reached at [phone]

## 2019-06-01 NOTE — Telephone Encounter (Signed)
Spoke with patient, patient was given MRI results by Tillie Rung, CMA on 05/31/2019. Referral for ortho placed on 05/31/2019. Patient wanted neurosurgery instead, placed on 06/01/2019.  Copied from [place] [phone]. Topic: Quick Communication - Other Results (Clinic Use ONLY) >> May 30, 2019 10:58 AM Lennox Solders wrote: Pt is calling and had mri of neck on Wednesday at [facility] hospital >> May 31, 2019 10:34 AM Percell Belt A wrote: Pt called back again about the MRI results.  She would like to know what the next step is because she is still in some pain  [phone]- best number

## 2019-06-02 NOTE — Therapy (Signed)
[facility] [address] [place], [name], [address] Phone: [phone]   Fax:  [phone]  Physical Therapy Treatment  Patient Details  Name: [name] MRN: [id number] Date of Birth: [birth year] Referring Provider (PT): Meredith Staggers, MD   Encounter Date: 06/01/2019  PT End of Session - 06/02/19 1140    Visit Number  7   30 visits total   Number of Visits  15    Date for PT Re-Evaluation  06/07/19    Authorization Type  [facility]; ?worker's comp - email sent on 11-09-18 to insurance specialist asking to confirm which insurance is primary; pt reports now she has [employer] as of mid March 2020    Authorization - Visit Number  7    Authorization - Number of Visits  30    PT Start Time  1305    PT Stop Time  1352    PT Time Calculation (min)  47 min    Equipment Utilized During Treatment  --   pool noodle   Activity Tolerance  Patient tolerated treatment well    Behavior During Therapy  [facility] for tasks assessed/performed       Past Medical History:  Diagnosis Date  . Anxiety   . Back pain    related to spinal stenosis and disc problem, radiates down left buttocks to leg., weakness occ.  [name] Dyspnea   . Headache   . Hyperlipidemia   . Hypertension   . PONV (postoperative nausea and vomiting)   . Vaginal foreign object    "Uses Femring"    Past Surgical History:  Procedure Laterality Date  . ABDOMINAL HYSTERECTOMY    . CARDIAC CATHETERIZATION N/A 04/18/2015   Procedure: Left Heart Cath and Coronary Angiography;  Surgeon: [name], MD;  Location: [facility];  Service: Cardiovascular;  Laterality: N/A;  . FOOT SURGERY Bilateral    [place] "bunion,bone spur, tendon" (1) -6'16, (1)-10'16  . IR EPIDUROGRAPHY  07/21/2018  . LUMBAR LAMINECTOMY/DECOMPRESSION MICRODISCECTOMY Bilateral 12/28/2015   Procedure: MICRO LUMBAR DECOMPRESSION L4 - L5 BILATERALLY;  Surgeon:  [name], MD;  Location: [facility];  Service: Orthopedics;  Laterality: Bilateral;  . LUMBAR LAMINECTOMY/DECOMPRESSION MICRODISCECTOMY Bilateral 03/04/2018   Procedure: Revision of Microlumbar Decompression Bilateral Lumbar Four-Five;  Surgeon: [name], MD;  Location: [facility];  Service: Orthopedics;  Laterality: Bilateral;  90 mins  . WISDOM TOOTH EXTRACTION    . WOUND EXPLORATION N/A 03/04/2018   Procedure: EXPLORATION OF LUMBAR DECOMPRESSION WOUND;  Surgeon: [name], MD;  Location: [facility];  Service: Orthopedics;  Laterality: N/A;    There were no vitals filed for this visit.  Subjective Assessment - 06/02/19 1136    Subjective  Pt presents for aquatic therapy at [facility] -pt reports haven taken pain medication so the pain in her arms and back is not too bad today    Pertinent History  decompression and laminectomy on 12/28/15 at L4-L5  with redo decompression surgery on May 9,2019, post-op paraparesis with surgery to remove small hematoma; 07/12/18 fall at work (involved rolling chair) with hospital admission, transfer to [facility] with d/c home 08/03/18; HTN, Bil foot surgery     Patient Stated Goals  improve walking    Currently in Pain?  Yes    Pain Score  4     Pain Location  Hand   hand and low back   Pain Orientation  Right;Left  Pain Descriptors / Indicators  Tingling;Pins and needles;Numbness    Pain Type  Neuropathic pain    Pain Onset  1 to 4 weeks ago    Pain Frequency  Constant    Pain Relieving Factors  water exercise helps to reduce back pain;  pain medicine also helps           Aquatic therapy at [facility] - pool temp 87.2 degrees   Patient seen for aquatic therapy today.  Treatment took place in water 3.5-4 feet deep depending upon activity.  Pt entered and  Exited the pool via step negotiation with use of bilateral hand rails with CGA for safety.  Pt performed runner's stretch on each leg and heel cord stretch bil. LE's with feet on pool wall - 30 sec hold each stretch x  1 rep at beginning and at end of session  Pt performed amb. [place] pool 6 reps forward; backward 1 rep and sideways 2 reps with use of noodle for UE support with therapist holding noodle for stability   Pt performed standing bil.  hip flexion, abduction and hip extension with knee extended -  10 reps each leg with UE support on pool edge for assist with balance;  Marching in place 10 reps each leg with UE support   Squats x 10 reps with bil. UE support on pool edge   Pt requires aquatic therapy for the unweighting provided by the buoyancy of the water and needs the viscosity for strengthening LE's and trunk musc.; current of water provided perturbations for balance challenges and for trunk stabilization; buoyancy of water also needed for spinal decompression and joint unloading for increased movement with less pain than can be Achieved with land exercise                          PT Short Term Goals - 06/02/19 1144      PT SHORT TERM GOAL #1   Title  Improve TUG score from  47.5 secs to </= 41 secs with use of RW to demo improved mobility and reduced fall risk.    Baseline  52.88 secs - 11-11-18; 47.5 secs with RW on 04-07-19    Time  4    Period  Weeks    Status  New    Target Date  05/07/19      PT SHORT TERM GOAL #2   Title  Pt will increase gait velocity from .71 ft/sec with RW to >/= 1.2 ft/sec with use of RW for incr. gait efficiency.    Baseline  21.66 secs = 1.51 ft/sec with RW - 11-11-18; 45.97 secs = .71 ft/sec with RW on 04-07-19 ; 1" 1 sec initial trial    Time  4    Period  Weeks    Status  New    Target Date  05/07/19      PT SHORT TERM GOAL #3   Title  Perform sit to stand with no UE support and stabilize independently (without bracing LE's against mat table) x 5 reps to demo improved LE strength.    Baseline  Pt braced legs on reps 1 and 2 of 5 reps on 04-07-19    Time  4    Period  Weeks    Status  New    Target Date  05/07/19      PT  SHORT TERM GOAL #4   Title  Amb. 115' with Lofstrand crutches  with  CGA on flat, even surface.    Baseline  pt using RW for assist. with amb.    Time  4    Period  Weeks    Status  New    Target Date  05/07/19      PT SHORT TERM GOAL #5   Title  Independent in updated HEP including aquatic exercise program.    Time  4    Period  Weeks    Status  New    Target Date  05/07/19        PT Long Term Goals - 06/02/19 1145      PT LONG TERM GOAL #1   Title  Improve TUG score to </= 35 secs with RW for reduced fall risk.    Baseline  52.88 secs with RW on 11-11-18; 44.7 secs with 1 Lofstrand crutch on 01-11-19; 47.5 secs    Time  8    Period  Weeks    Status  New      PT LONG TERM GOAL #2   Title  Patient will improve gait velocity to >/= 1.5 ft/sec with Rt AFO and RW, demonstrating lesser fall risk    Baseline  21.66 secs = 1.51 ft/sec with RW;  25.6 secs = 1.28 ft/sec with 1 Lofstrand crutch - 01-12-19; 45.97 secs = .71 ft/sec ft/sec with RW on 04-07-19    Time  8    Period  Weeks    Status  New      PT LONG TERM GOAL #3   Title  Negotiate 4 steps with 1 hand rail using a step by step sequence.    Baseline  step by step sequence with use of hand rail and with 1 Lofstrand crutch - 01-11-19    Time  8    Period  Weeks    Status  New      PT LONG TERM GOAL #4   Title  Modified independent household amb. with use of 1 Lofstrand crutch (with use of AFO on RLE).    Baseline  not met 01-12-19    Time  8    Period  Weeks    Status  New            Plan - 06/02/19 1141    Clinical Impression Statement  Pt reports aquatic exercise significantly helps to reduce back pain as buoyancy provides spinal decompression and joint offloading, allowing freedom of movement which is unable to be achieved with land exercise.  Pt also reports some decreased tingling in bil. UE's due to compressive forces of hydrostatic pressure of the water.    PT Frequency  1x / week    PT Duration  8 weeks     PT Treatment/Interventions  Manual techniques;Patient/family education;Therapeutic exercise;Therapeutic activities;Gait training;ADLs/Self Care Home Management;Neuromuscular re-education;Functional mobility training;Passive range of motion;Balance training;Aquatic Therapy;Biofeedback;DME Instruction;Stair training;Orthotic Fit/Training;Dry needling    PT Next Visit Plan  cont aquatic therapy for back pain and for improved mobility       Patient will benefit from skilled therapeutic intervention in order to improve the following deficits and impairments:  Pain, Postural dysfunction, Difficulty walking, Decreased strength, Decreased activity tolerance, Abnormal gait, Increased muscle spasms, Decreased endurance, Decreased balance, Decreased coordination, Decreased knowledge of use of DME, Decreased mobility, Decreased safety awareness, Impaired sensation, Impaired tone  Visit Diagnosis: 1. Other abnormalities of gait and mobility   2. Muscle weakness (generalized)   3. Unsteadiness on feet        Problem  List Patient Active Problem List   Diagnosis Date Noted  . Lumbar spinal stenosis   . Radicular pain   . Gait disorder   . Difficulty in urination 07/13/2018  . Back pain 07/12/2018  . Hyperlipidemia 04/16/2018  . Generalized anxiety disorder with panic attacks 04/16/2018  . AKI (acute kidney injury) ([place])   . Benign essential HTN   . Depression with anxiety   . Lumbar radiculopathy 03/09/2018  . Paraparesis ([place]) 03/09/2018  . Essential hypertension   . Chronic nonintractable headache   . Leukocytosis   . Acute blood loss anemia   . Postoperative pain   . Generalized weakness   . Spinal stenosis at L4-L5 level 03/04/2018  . Spinal stenosis of lumbar region 12/28/2015  . Chest pain 04/16/2015    Marline Morace, [place], PT, [place] 06/02/2019, 11:46 AM  [facility] [address] [place], [name], [address] Phone:  [phone]   Fax:  [phone]  Name: BRITLEY GASHI MRN: [id number] Date of Birth: 23-Sep-1961

## 2019-06-06 MED FILL — ATORVASTATIN 20 MG TABLET: 20 | 90 days supply | Qty: 90 | Fill #1

## 2019-06-07 ENCOUNTER — Ambulatory Visit: Payer: BC Managed Care – PPO | Admitting: Orthopaedic Surgery

## 2019-06-08 NOTE — Telephone Encounter (Signed)
Spoke with patient and stated that the long term disability place need a letter stating the results of the MRI so that she can get her payment. Patient will be by the office on tomorrow to sign release form and give fax number to office.  Copied from [place] [id number]. Topic: General - Other >> Jun 07, 2019 11:12 AM Pauline Good wrote: Reason for CRM: pt calling concerning her message from yesterday b/c no one has called her.

## 2019-06-08 NOTE — Telephone Encounter (Signed)
I think we can print report of cervical and lumbar MRI and give it to her. Thanks, BJ

## 2019-06-09 ENCOUNTER — Other Ambulatory Visit: Payer: Self-pay

## 2019-06-09 ENCOUNTER — Ambulatory Visit (INDEPENDENT_AMBULATORY_CARE_PROVIDER_SITE_OTHER): Payer: BC Managed Care – PPO | Admitting: Orthopaedic Surgery

## 2019-06-09 ENCOUNTER — Encounter: Payer: Self-pay | Admitting: Orthopaedic Surgery

## 2019-06-09 VITALS — Ht 65.0 in | Wt 157.0 lb

## 2019-06-09 DIAGNOSIS — M5412 Radiculopathy, cervical region: Secondary | ICD-10-CM | POA: Diagnosis not present

## 2019-06-09 NOTE — Telephone Encounter (Unsigned)
Copied from [place] [id number]. Topic: Referral - Question >> Jun 06, 2019  2:17 PM Sheran Luz wrote: Patient requesting call back regarding neuro referral.

## 2019-06-09 NOTE — Telephone Encounter (Signed)
Pt stated that she has spoken to someone from the office already. No further action needed!

## 2019-06-09 NOTE — Progress Notes (Signed)
Office Visit Note   Patient: [name]           Date of Birth: 1961-08-22           MRN: [id number] Visit Date: 06/09/2019              Requested by: [name], [name], MD [address] [place],  [place] [address] PCP: [name], [name], MD   Assessment & Plan: Visit Diagnoses:  1. Cervical radiculopathy     Plan: MRI of the cervical spine was reviewed shows multilevel degenerative disc disease with areas of mild to moderate canal stenosis.  At this point we will refer her to Dr. Ernestina Patches for evaluation and consideration of a cervical spine ESI.  Per the patient she already has been ruled out for carpal tunnel syndrome.  Follow-Up Instructions: Return if symptoms worsen or fail to improve.   Orders:  Orders Placed This Encounter  Procedures   Ambulatory referral to Physical Medicine Rehab   No orders of the defined types were placed in this encounter.     Procedures: No procedures performed   Clinical Data: No additional findings.   Subjective: Chief Complaint  Patient presents with   Neck - Pain    [name] is a 58 year old female comes in for chronic neck pain and bilateral extremity radicular pain.  She had previously been ruled out for carpal tunnel syndrome a few years ago.  She is status post lumbar surgery by [name] which unfortunately was complicated by a right foot drop.  She has a consultation with Dr. Rolena Infante next week for continued back problems.  In terms of her neck she has an MRI and she has been referred here by Dr. [name] who is her PCP.  She states that she has constant burning pain in her hands that shoot up into the shoulder.   Review of Systems  Constitutional: Negative.   HENT: Negative.   Eyes: Negative.   Respiratory: Negative.   Cardiovascular: Negative.   Endocrine: Negative.   Musculoskeletal: Negative.   Neurological: Negative.   Hematological: Negative.   Psychiatric/Behavioral: Negative.   All other systems  reviewed and are negative.    Objective: Vital Signs: Ht '5\' 5"'$  (1.651 m)    Wt 157 lb (71.2 kg)    BMI 26.13 kg/m   Physical Exam Vitals signs and nursing note reviewed.  Constitutional:      Appearance: She is well-developed.  HENT:     Head: Normocephalic and atraumatic.  Neck:     Musculoskeletal: Neck supple.  Pulmonary:     Effort: Pulmonary effort is normal.  Abdominal:     Palpations: Abdomen is soft.  Skin:    General: Skin is warm.     Capillary Refill: Capillary refill takes less than 2 seconds.  Neurological:     Mental Status: She is alert and oriented to person, place, and time.  Psychiatric:        Behavior: Behavior normal.        Thought Content: Thought content normal.        Judgment: Judgment normal.     Ortho Exam Cervical spine is tender to palpation.  Positive Spurling sign.  Mild weakness with motor function secondary to pain. Positive carpal tunnel compressive signs. Specialty Comments:  No specialty comments available.  Imaging: No results found.   PMFS History: Patient Active Problem List   Diagnosis Date Noted   Lumbar spinal stenosis    Radicular pain  Gait disorder    Difficulty in urination 07/13/2018   Back pain 07/12/2018   Hyperlipidemia 04/16/2018   Generalized anxiety disorder with panic attacks 04/16/2018   AKI (acute kidney injury) ([place])    Benign essential HTN    Depression with anxiety    Lumbar radiculopathy 03/09/2018   Paraparesis ([place]) 03/09/2018   Essential hypertension    Chronic nonintractable headache    Leukocytosis    Acute blood loss anemia    Postoperative pain    Generalized weakness    Spinal stenosis at L4-L5 level 03/04/2018   Spinal stenosis of lumbar region 12/28/2015   Chest pain 04/16/2015   Past Medical History:  Diagnosis Date   Anxiety    Back pain    related to spinal stenosis and disc problem, radiates down left buttocks to leg., weakness occ.   Dyspnea      Headache    Hyperlipidemia    Hypertension    PONV (postoperative nausea and vomiting)    Vaginal foreign object    "Uses Femring"    Family History  Problem Relation Age of Onset   Heart attack Mother    Lung cancer Father    Cancer Father    Pancreatic cancer Sister    Breast cancer Sister 47   Throat cancer Brother    Multiple myeloma Sister    Breast cancer Sister        diagnosed in her 29's   Heart attack Sister    Stomach cancer Cousin    Colon cancer Neg Hx     Past Surgical History:  Procedure Laterality Date   ABDOMINAL HYSTERECTOMY     CARDIAC CATHETERIZATION N/A 04/18/2015   Procedure: Left Heart Cath and Coronary Angiography;  Surgeon: [name], MD;  Location: [facility];  Service: Cardiovascular;  Laterality: N/A;   FOOT SURGERY Bilateral    [place] "bunion,bone spur, tendon" (1) -6'16, (1)-10'16   IR EPIDUROGRAPHY  07/21/2018   LUMBAR LAMINECTOMY/DECOMPRESSION MICRODISCECTOMY Bilateral 12/28/2015   Procedure: MICRO LUMBAR DECOMPRESSION L4 - L5 BILATERALLY;  Surgeon: [name], MD;  Location: [facility];  Service: Orthopedics;  Laterality: Bilateral;   LUMBAR LAMINECTOMY/DECOMPRESSION MICRODISCECTOMY Bilateral 03/04/2018   Procedure: Revision of Microlumbar Decompression Bilateral Lumbar Four-Five;  Surgeon: [name], MD;  Location: [facility];  Service: Orthopedics;  Laterality: Bilateral;  90 mins   WISDOM TOOTH EXTRACTION     WOUND EXPLORATION N/A 03/04/2018   Procedure: EXPLORATION OF LUMBAR DECOMPRESSION WOUND;  Surgeon: [name], MD;  Location: [facility];  Service: Orthopedics;  Laterality: N/A;   Social History   Occupational History   Not on file  Tobacco Use   Smoking status: Never Smoker   Smokeless tobacco: Never Used  Substance and Sexual Activity   Alcohol use: No   Drug use: No   Sexual activity: Yes    Partners: Male    Birth control/protection: Post-menopausal

## 2019-06-13 ENCOUNTER — Telehealth: Payer: Self-pay | Admitting: *Deleted

## 2019-06-13 DIAGNOSIS — M5136 Other intervertebral disc degeneration, lumbar region: Secondary | ICD-10-CM | POA: Diagnosis not present

## 2019-06-13 DIAGNOSIS — M961 Postlaminectomy syndrome, not elsewhere classified: Secondary | ICD-10-CM | POA: Diagnosis not present

## 2019-06-15 ENCOUNTER — Other Ambulatory Visit: Payer: Self-pay

## 2019-06-15 ENCOUNTER — Ambulatory Visit: Payer: BC Managed Care – PPO | Admitting: Physical Therapy

## 2019-06-15 DIAGNOSIS — R2689 Other abnormalities of gait and mobility: Secondary | ICD-10-CM | POA: Diagnosis not present

## 2019-06-15 DIAGNOSIS — R2681 Unsteadiness on feet: Secondary | ICD-10-CM | POA: Diagnosis not present

## 2019-06-15 DIAGNOSIS — M6281 Muscle weakness (generalized): Secondary | ICD-10-CM | POA: Diagnosis not present

## 2019-06-16 ENCOUNTER — Encounter: Payer: Self-pay | Admitting: Physical Therapy

## 2019-06-16 ENCOUNTER — Other Ambulatory Visit: Payer: Self-pay | Admitting: Physical Medicine and Rehabilitation

## 2019-06-16 DIAGNOSIS — F411 Generalized anxiety disorder: Secondary | ICD-10-CM

## 2019-06-16 MED ORDER — DIAZEPAM 5 MG PO TABS: ORAL_TABLET | ORAL | 0 refills | Status: DC

## 2019-06-16 MED FILL — diazePAM 5 MG TABS: 5 | 1 days supply | Qty: 2 | Fill #0

## 2019-06-16 NOTE — Telephone Encounter (Signed)
done

## 2019-06-16 NOTE — Progress Notes (Signed)
Pre-procedure diazepam ordered for pre-operative anxiety.  

## 2019-06-16 NOTE — Therapy (Signed)
[facility] [address] [place], [name], [address] Phone: [phone]   Fax:  [phone]  Physical Therapy Treatment  Patient Details  Name: [name] MRN: [id number] Date of Birth: 06/19/1961 Referring Provider (PT): Meredith Staggers, MD   Encounter Date: 06/15/2019  PT End of Session - 06/16/19 2016    Visit Number  8    Number of Visits  15    Date for PT Re-Evaluation  06/07/19    Authorization - Visit Number  8    Authorization - Number of Visits  30    PT Start Time  7106    PT Stop Time  1635    PT Time Calculation (min)  45 min    Equipment Utilized During Treatment  --   pool noodle   Activity Tolerance  Patient tolerated treatment well    Behavior During Therapy  [facility] for tasks assessed/performed       Past Medical History:  Diagnosis Date  . Anxiety   . Back pain    related to spinal stenosis and disc problem, radiates down left buttocks to leg., weakness occ.  [name] Dyspnea   . Headache   . Hyperlipidemia   . Hypertension   . PONV (postoperative nausea and vomiting)   . Vaginal foreign object    "Uses Femring"    Past Surgical History:  Procedure Laterality Date  . ABDOMINAL HYSTERECTOMY    . CARDIAC CATHETERIZATION N/A 04/18/2015   Procedure: Left Heart Cath and Coronary Angiography;  Surgeon: [name], MD;  Location: [facility];  Service: Cardiovascular;  Laterality: N/A;  . FOOT SURGERY Bilateral    [place] "bunion,bone spur, tendon" (1) -6'16, (1)-10'16  . IR EPIDUROGRAPHY  07/21/2018  . LUMBAR LAMINECTOMY/DECOMPRESSION MICRODISCECTOMY Bilateral 12/28/2015   Procedure: MICRO LUMBAR DECOMPRESSION L4 - L5 BILATERALLY;  Surgeon: [name], MD;  [facility];  Service: Orthopedics;  Laterality: Bilateral;  . LUMBAR LAMINECTOMY/DECOMPRESSION MICRODISCECTOMY Bilateral 03/04/2018   Procedure: Revision of Microlumbar Decompression Bilateral Lumbar Four-Five;   Surgeon: [name], MD;  Location: [facility];  Service: Orthopedics;  Laterality: Bilateral;  90 mins  . WISDOM TOOTH EXTRACTION    . WOUND EXPLORATION N/A 03/04/2018   Procedure: EXPLORATION OF LUMBAR DECOMPRESSION WOUND;  Surgeon: [name], MD;  Location: [facility];  Service: Orthopedics;  Laterality: N/A;    There were no vitals filed for this visit.  Subjective Assessment - 06/16/19 2014    Subjective  Pt presents for aquatic therapy at [facility] -pt reports she saw Dr. Rolena Infante who said she really needed to work on strengthening her Rt leg; pt does report that he said she had alot of nerve damage in her back which is causing the RLE weakness    Pertinent History  decompression and laminectomy on 12/28/15 at L4-L5  with redo decompression surgery on May 9,2019, post-op paraparesis with surgery to remove small hematoma; 07/12/18 fall at work (involved rolling chair) with hospital admission, transfer to [place] with d/c home 08/03/18; HTN, Bil foot surgery     Patient Stated Goals  improve walking    Currently in Pain?  Yes    Pain Score  3     Pain Location  Hand    Pain Orientation  Right;Left    Pain Descriptors / Indicators  Tingling;Pins and needles;Numbness    Pain Type  Neuropathic pain    Pain Onset  1 to 4 weeks ago    Pain  Frequency  Constant            Aquatic therapy at [facility] - pool temp 87.4 degrees   Patient seen for aquatic therapy today.  Treatment took place in water 3.5-4 feet deep depending upon activity.  Pt entered and  Exited the pool via step negotiation with use of bilateral hand rails with CGA for safety.  Pt performed runner's stretch on each leg and heel cord stretch bil. LE's with feet on pool wall - 30 sec hold each stretch x 1 rep Pt performed amb. [place] pool 4 reps forward; backward 1 rep and sideways 1 rep with use of noodle for UE support with therapist holding noodle for stability   Pt performed standing hip flexion, abduction and hip extension with knee  extended -  10 reps each leg with UE support with use of buoyant cuff weight  on pool edge for assist with balance;  Marching in place 10 reps each leg with UE support   Squats x 10 reps with bil. UE support on pool edge   Pt requires aquatic therapy for the unweighting provided by the buoyancy of the water and needs the viscosity for strengthening LE's and trunk musc.; current of water provided perturbations for balance challenges and for trunk stabilization                                         PT Short Term Goals - 06/02/19 1144      PT SHORT TERM GOAL #1   Title  Improve TUG score from  47.5 secs to </= 41 secs with use of RW to demo improved mobility and reduced fall risk.    Baseline  52.88 secs - 11-11-18; 47.5 secs with RW on 04-07-19    Time  4    Period  Weeks    Status  New    Target Date  05/07/19      PT SHORT TERM GOAL #2   Title  Pt will increase gait velocity from .71 ft/sec with RW to >/= 1.2 ft/sec with use of RW for incr. gait efficiency.    Baseline  21.66 secs = 1.51 ft/sec with RW - 11-11-18; 45.97 secs = .71 ft/sec with RW on 04-07-19 ; 1" 1 sec initial trial    Time  4    Period  Weeks    Status  New    Target Date  05/07/19      PT SHORT TERM GOAL #3   Title  Perform sit to stand with no UE support and stabilize independently (without bracing LE's against mat table) x 5 reps to demo improved LE strength.    Baseline  Pt braced legs on reps 1 and 2 of 5 reps on 04-07-19    Time  4    Period  Weeks    Status  New    Target Date  05/07/19      PT SHORT TERM GOAL #4   Title  Amb. 115' with Lofstrand crutches  with CGA on flat, even surface.    Baseline  pt using RW for assist. with amb.    Time  4    Period  Weeks    Status  New    Target Date  05/07/19      PT SHORT TERM GOAL #5   Title  Independent in updated HEP including aquatic  exercise program.    Time  4    Period  Weeks    Status  New    Target Date   05/07/19        PT Long Term Goals - 06/16/19 2021      PT LONG TERM GOAL #1   Title  Improve TUG score to </= 35 secs with RW for reduced fall risk.    Baseline  52.88 secs with RW on 11-11-18; 44.7 secs with 1 Lofstrand crutch on 01-11-19; 47.5 secs    Time  8    Period  Weeks    Status  New      PT LONG TERM GOAL #2   Title  Patient will improve gait velocity to >/= 1.5 ft/sec with Rt AFO and RW, demonstrating lesser fall risk    Baseline  21.66 secs = 1.51 ft/sec with RW;  25.6 secs = 1.28 ft/sec with 1 Lofstrand crutch - 01-12-19; 45.97 secs = .71 ft/sec ft/sec with RW on 04-07-19    Time  8    Period  Weeks    Status  New      PT LONG TERM GOAL #3   Title  Negotiate 4 steps with 1 hand rail using a step by step sequence.    Baseline  step by step sequence with use of hand rail and with 1 Lofstrand crutch - 01-11-19    Time  8    Period  Weeks    Status  New      PT LONG TERM GOAL #4   Title  Modified independent household amb. with use of 1 Lofstrand crutch (with use of AFO on RLE).    Baseline  not met 01-12-19    Time  8    Period  Weeks    Status  New            Plan - 06/16/19 2018    Clinical Impression Statement  Pt performed more aggressive strengthenning exercises in the pool today with use of ankle cuffs for increased resistance  in addition to the viscosity of the water.  Pt tolerated exercises well.    PT Frequency  1x / week    PT Duration  8 weeks    PT Next Visit Plan  cont aquatic therapy for back pain and for improved mobility    PT Home Exercise Plan  seated ab sets and ball squeezes; leg lengthener and leg presses , standing static balance without UE; added runner's stretch and heel cord stretch to HEP    Consulted and Agree with Plan of Care  Patient       Patient will benefit from skilled therapeutic intervention in order to improve the following deficits and impairments:  Pain, Postural dysfunction, Difficulty walking, Decreased strength,  Decreased activity tolerance, Abnormal gait, Increased muscle spasms, Decreased endurance, Decreased balance, Decreased coordination, Decreased knowledge of use of DME, Decreased mobility, Decreased safety awareness, Impaired sensation, Impaired tone  Visit Diagnosis: Other abnormalities of gait and mobility  Muscle weakness (generalized)     Problem List Patient Active Problem List   Diagnosis Date Noted  . Lumbar spinal stenosis   . Radicular pain   . Gait disorder   . Difficulty in urination 07/13/2018  . Back pain 07/12/2018  . Hyperlipidemia 04/16/2018  . Generalized anxiety disorder with panic attacks 04/16/2018  . AKI (acute kidney injury) ([place])   . Benign essential HTN   . Depression with anxiety   . Lumbar  radiculopathy 03/09/2018  . Paraparesis ([place]) 03/09/2018  . Essential hypertension   . Chronic nonintractable headache   . Leukocytosis   . Acute blood loss anemia   . Postoperative pain   . Generalized weakness   . Spinal stenosis at L4-L5 level 03/04/2018  . Spinal stenosis of lumbar region 12/28/2015  . Chest pain 04/16/2015    Antwone Capozzoli, [place], [place], [place] 06/16/2019, 8:24 PM  [facility] [address] [place], [name], [address] Phone: [phone]   Fax:  [phone]  Name: [name] MRN: [id number] Date of Birth: 1961/02/04

## 2019-06-18 ENCOUNTER — Other Ambulatory Visit: Payer: Self-pay | Admitting: Physical Medicine & Rehabilitation

## 2019-06-18 DIAGNOSIS — M5416 Radiculopathy, lumbar region: Secondary | ICD-10-CM

## 2019-06-18 DIAGNOSIS — G8220 Paraplegia, unspecified: Secondary | ICD-10-CM

## 2019-06-18 DIAGNOSIS — Z9889 Other specified postprocedural states: Secondary | ICD-10-CM

## 2019-06-18 MED FILL — GABAPENTIN 400 MG CAPSULE: 400 | 30 days supply | Qty: 90 | Fill #0

## 2019-06-20 DIAGNOSIS — M961 Postlaminectomy syndrome, not elsewhere classified: Secondary | ICD-10-CM | POA: Diagnosis not present

## 2019-06-20 DIAGNOSIS — G894 Chronic pain syndrome: Secondary | ICD-10-CM | POA: Diagnosis not present

## 2019-06-21 ENCOUNTER — Telehealth: Payer: Self-pay | Admitting: Family Medicine

## 2019-06-21 MED FILL — ALPRAZolam 0.25 MG TABS: 0.25 | 30 days supply | Qty: 30 | Fill #1

## 2019-06-21 MED FILL — traMADol HCL 50 MG TABS: 50 | 7 days supply | Qty: 14 | Fill #0

## 2019-06-21 NOTE — Telephone Encounter (Signed)
Spoke with patient and informed her that she was called by [facility] because that referral was placed before she changed her mind and said that she wanted to go to Neurosurgery. Patient stated that she would call [facility] back and cancel because as of right now she feel comfortable staying with the neurosurgery office because they have already scheduled for her to have injections done. Nothing further needed at this time.

## 2019-06-21 NOTE — Telephone Encounter (Signed)
Pt has questions concerning the referrals that was put in for her. Pt stated [facility] called her for an appt and she don't know why

## 2019-06-22 ENCOUNTER — Ambulatory Visit: Payer: BC Managed Care – PPO | Admitting: Physical Therapy

## 2019-06-22 ENCOUNTER — Encounter
Payer: BC Managed Care – PPO | Attending: Physical Medicine & Rehabilitation | Admitting: Physical Medicine & Rehabilitation

## 2019-06-22 ENCOUNTER — Encounter: Payer: Self-pay | Admitting: Physical Medicine & Rehabilitation

## 2019-06-22 ENCOUNTER — Other Ambulatory Visit: Payer: Self-pay

## 2019-06-22 VITALS — BP 130/87 | HR 81 | Temp 97.7°F | Ht 65.0 in | Wt 177.0 lb

## 2019-06-22 DIAGNOSIS — Z5181 Encounter for therapeutic drug level monitoring: Secondary | ICD-10-CM

## 2019-06-22 DIAGNOSIS — R2681 Unsteadiness on feet: Secondary | ICD-10-CM | POA: Diagnosis not present

## 2019-06-22 DIAGNOSIS — Z809 Family history of malignant neoplasm, unspecified: Secondary | ICD-10-CM | POA: Diagnosis not present

## 2019-06-22 DIAGNOSIS — Z09 Encounter for follow-up examination after completed treatment for conditions other than malignant neoplasm: Secondary | ICD-10-CM | POA: Diagnosis not present

## 2019-06-22 DIAGNOSIS — R2689 Other abnormalities of gait and mobility: Secondary | ICD-10-CM

## 2019-06-22 DIAGNOSIS — Z803 Family history of malignant neoplasm of breast: Secondary | ICD-10-CM | POA: Diagnosis not present

## 2019-06-22 DIAGNOSIS — Z90710 Acquired absence of both cervix and uterus: Secondary | ICD-10-CM | POA: Diagnosis not present

## 2019-06-22 DIAGNOSIS — F419 Anxiety disorder, unspecified: Secondary | ICD-10-CM | POA: Insufficient documentation

## 2019-06-22 DIAGNOSIS — I10 Essential (primary) hypertension: Secondary | ICD-10-CM | POA: Insufficient documentation

## 2019-06-22 DIAGNOSIS — Z800 Family history of malignant neoplasm of digestive organs: Secondary | ICD-10-CM | POA: Insufficient documentation

## 2019-06-22 DIAGNOSIS — Z9889 Other specified postprocedural states: Secondary | ICD-10-CM

## 2019-06-22 DIAGNOSIS — Z801 Family history of malignant neoplasm of trachea, bronchus and lung: Secondary | ICD-10-CM | POA: Insufficient documentation

## 2019-06-22 DIAGNOSIS — G894 Chronic pain syndrome: Secondary | ICD-10-CM | POA: Diagnosis not present

## 2019-06-22 DIAGNOSIS — Z807 Family history of other malignant neoplasms of lymphoid, hematopoietic and related tissues: Secondary | ICD-10-CM | POA: Diagnosis not present

## 2019-06-22 DIAGNOSIS — G8220 Paraplegia, unspecified: Secondary | ICD-10-CM

## 2019-06-22 DIAGNOSIS — Z808 Family history of malignant neoplasm of other organs or systems: Secondary | ICD-10-CM | POA: Insufficient documentation

## 2019-06-22 DIAGNOSIS — M6281 Muscle weakness (generalized): Secondary | ICD-10-CM

## 2019-06-22 DIAGNOSIS — Z79891 Long term (current) use of opiate analgesic: Secondary | ICD-10-CM | POA: Diagnosis not present

## 2019-06-22 DIAGNOSIS — M62838 Other muscle spasm: Secondary | ICD-10-CM | POA: Insufficient documentation

## 2019-06-22 DIAGNOSIS — M5416 Radiculopathy, lumbar region: Secondary | ICD-10-CM | POA: Insufficient documentation

## 2019-06-22 MED ORDER — TRAMADOL HCL 50 MG PO TABS: ORAL_TABLET | ORAL | 2 refills | Status: DC

## 2019-06-22 NOTE — Patient Instructions (Signed)
PLEASE FEEL FREE TO CALL OUR OFFICE WITH ANY PROBLEMS OR QUESTIONS ([phone])      

## 2019-06-22 NOTE — Progress Notes (Signed)
Subjective:    Patient ID: [name], female    DOB: 03-08-1961, 58 y.o.   MRN: [id number] [name] is here in follow up of her chronic low back pain and polyradiculopathy.  She had ongoing pain in her right arm.  We had discussed this at a previous visit.  Her primary care physician saw her and ordered MRI of her cervical and lumbar spine.   Cervical MRI results were as follows:Multilevel cervical spondylosis, most pronounced at the C5-6 level where there is moderate canal stenosis and mild bilateral foraminal stenosis. No significant interval progression when compared with prior MRI 03/08/2018.  Patient is now set up for some sort of cervical injection next week for orthopedic surgery.  Also spinal stimulator has been discussed and she is considering this.  Overall her activity levels have decreased.  She attributes it to COVID as well as more pain in general in her legs.  She does try to stay active walking each day and does like to get into the pool for pool related activities.       HPI  Pain Inventory Average Pain 7 Pain Right Now 7 My pain is sharp, burning, tingling and aching  In the last 24 hours, has pain interfered with the following? General activity 6 Relation with others 6 Enjoyment of life 6 What TIME of day is your pain at its worst? varies Sleep (in general) Poor  Pain is worse with: sitting and standing Pain improves with: medication Relief from Meds: 5  Mobility walk with assistance use a cane use a walker ability to climb steps?  yes do you drive?  no  Function disabled: date disabled . I need assistance with the following:  household duties  Neuro/Psych weakness numbness tremor tingling trouble walking spasms dizziness anxiety  Prior Studies x-rays CT/MRI  Physicians involved in your care Orthopedist Dr. Nelva Bush   Family History  Problem Relation Age of Onset  . Heart attack Mother   . Lung cancer Father   .  Cancer Father   . Pancreatic cancer Sister   . Breast cancer Sister 16  . Throat cancer Brother   . Multiple myeloma Sister   . Breast cancer Sister        diagnosed in her 22's  . Heart attack Sister   . Stomach cancer Cousin   . Colon cancer Neg Hx    Social History   Socioeconomic History  . Marital status: Married    Spouse name: Not on file  . Number of children: Not on file  . Years of education: Not on file  . Highest education level: Not on file  Occupational History  . Not on file  Social Needs  . Financial resource strain: Not on file  . Food insecurity    Worry: Not on file    Inability: Not on file  . Transportation needs    Medical: Not on file    Non-medical: Not on file  Tobacco Use  . Smoking status: Never Smoker  . Smokeless tobacco: Never Used  Substance and Sexual Activity  . Alcohol use: No  . Drug use: No  . Sexual activity: Yes    Partners: Male    Birth control/protection: Post-menopausal  Lifestyle  . Physical activity    Days per week: Not on file    Minutes per session: Not on file  . Stress: Not on file  Relationships  . Social connections    Talks on phone: Not on  file    Gets together: Not on file    Attends religious service: Not on file    Active member of club or organization: Not on file    Attends meetings of clubs or organizations: Not on file    Relationship status: Not on file  Other Topics Concern  . Not on file  Social History Narrative  . Not on file   Past Surgical History:  Procedure Laterality Date  . ABDOMINAL HYSTERECTOMY    . CARDIAC CATHETERIZATION N/A 04/18/2015   Procedure: Left Heart Cath and Coronary Angiography;  Surgeon: [name], MD;  Location: [facility];  Service: Cardiovascular;  Laterality: N/A;  . FOOT SURGERY Bilateral    [place] "bunion,bone spur, tendon" (1) -6'16, (1)-10'16  . IR EPIDUROGRAPHY  07/21/2018  . LUMBAR LAMINECTOMY/DECOMPRESSION MICRODISCECTOMY Bilateral  12/28/2015   Procedure: MICRO LUMBAR DECOMPRESSION L4 - L5 BILATERALLY;  Surgeon: [name], MD;  [facility];  Service: Orthopedics;  Laterality: Bilateral;  . LUMBAR LAMINECTOMY/DECOMPRESSION MICRODISCECTOMY Bilateral 03/04/2018   Procedure: Revision of Microlumbar Decompression Bilateral Lumbar Four-Five;  Surgeon: [name], MD;  Location: [facility];  Service: Orthopedics;  Laterality: Bilateral;  90 mins  . WISDOM TOOTH EXTRACTION    . WOUND EXPLORATION N/A 03/04/2018   Procedure: EXPLORATION OF LUMBAR DECOMPRESSION WOUND;  Surgeon: [name], MD;  Location: [facility];  Service: Orthopedics;  Laterality: N/A;   Past Medical History:  Diagnosis Date  . Anxiety   . Back pain    related to spinal stenosis and disc problem, radiates down left buttocks to leg., weakness occ.  [name] Dyspnea   . Headache   . Hyperlipidemia   . Hypertension   . PONV (postoperative nausea and vomiting)   . Vaginal foreign object    "Uses Femring"   BP 130/87   Pulse 81   Temp 97.7 F (36.5 C)   Ht '5\' 5"'$  (1.651 m)   Wt 177 lb (80.3 kg)   SpO2 98%   BMI 29.45 kg/m   Opioid Risk Score:   Fall Risk Score:  `1  Depression screen PHQ 2/9  Depression screen [facility] 2/9 08/17/2018 07/05/2018 05/03/2018 04/01/2018  Decreased Interest 0 0 0 1  Down, Depressed, Hopeless 0 0 0 1  PHQ - 2 Score 0 0 0 2  Altered sleeping - - - 1  Tired, decreased energy - - - 1  Change in appetite - - - 1  Feeling bad or failure about yourself  - - - 0  Trouble concentrating - - - 0  Moving slowly or fidgety/restless - - - 0  Suicidal thoughts - - - 0  PHQ-9 Score - - - 5  Difficult doing work/chores - - - Somewhat difficult  Some recent data might be hidden     Review of Systems  Constitutional: Positive for unexpected weight change.  HENT: Negative.   Eyes: Negative.   Respiratory: Negative.   Cardiovascular: Negative.   Gastrointestinal: Negative.   Endocrine: Negative.   Genitourinary: Negative.    Musculoskeletal: Positive for gait problem.  Skin: Negative.   Allergic/Immunologic: Negative.   Neurological: Positive for weakness.  Hematological: Negative.   Psychiatric/Behavioral: Negative.   All other systems reviewed and are negative.      Objective:   Physical Exam  General: No acute distress HEENT: EOMI, oral membranes moist Cards: reg rate  Chest: normal effort Abdomen: Soft, NT, ND Skin: dry, intact Extremities: no edema Skin: Clean and intact without signs  of breakdown Neuro: Pt is cognitively appropriate with normal insight, memory, and awareness. Cranial nerves 2-12 are intact.   Reflexes are 2+ in all 4's. Fine motor coordination is intact. No tremors. Motor function is grossly 4-5 out of 5 and inconsistent really.[name] LLE 4/5. RLE 3/5 prox to 2/5 distally with cogwheeling.    Continues to drag the right foot during swing phase and tends to laterally with the leg to help with clearance.  She does a bit better after she has been walking for a bit.  Bilateral Tinel's signs at both wrists..  Musculoskeletal: Full ROM, No pain with AROM or PROM in the neck, trunk, or extremities. Posture appropriate Psych:  Pleasant         Assessment & Plan:  Medical Problem List and Plan: 1.Decreased functional mobilitysecondary to acute onchronic lumbar radiculopathy and foraminal stenosis bilaterally with decompression May 2019. more recent fall with exacerbation of pain and motor or sensory deficits.  -moderate central stenosis at C5-6 on MRI.        -for injections per [facility]?       -MRI is not impressive for foraminal stenosis however        -EMG/NCS?        -Discussed ongoing home exercise program to maintain functional mobility and balance. 2. Pain Management:Neurontin: -continue at'600mg'$  QID as she's been doing -baclofen '10mg'$  TID -continueUltram 50 mg every 12 hours. #60 --RF today We will  continue the controlled substance monitoring program, this consists of regular clinic visits, examinations, routine drug screening, pill counts as well as use of [place] Controlled Substance Reporting System. [place].    -voltaren gel for left knee -  4. Mood:  -Patient appears to be very positive about her rehab and works hard 5. .Constipation:.  -Continue with current regimen                15 minutes of face to face patient care time were spent during this visit. All questions were encouraged and answered. Follow up in 6 months

## 2019-06-23 NOTE — Therapy (Signed)
[facility] [address] [place], [name], [address] Phone: [phone]   Fax:  [phone]  Physical Therapy Treatment  Patient Details  Name: [name] MRN: [id number] Date of Birth: Jan 11, 1961 Referring Provider (PT): Meredith Staggers, MD   Encounter Date: 06/22/2019  PT End of Session - 06/23/19 2003    Visit Number  9    Number of Visits  15    Date for PT Re-Evaluation  06/27/19    Authorization - Visit Number  9    Authorization - Number of Visits  30    PT Start Time  1505    PT Stop Time  1550    PT Time Calculation (min)  45 min    Equipment Utilized During Treatment  --   pool noodle, aquatic cuffs   Activity Tolerance  Patient tolerated treatment well    Behavior During Therapy  [facility] for tasks assessed/performed       Past Medical History:  Diagnosis Date  . Anxiety   . Back pain    related to spinal stenosis and disc problem, radiates down left buttocks to leg., weakness occ.  [name] Dyspnea   . Headache   . Hyperlipidemia   . Hypertension   . PONV (postoperative nausea and vomiting)   . Vaginal foreign object    "Uses Femring"    Past Surgical History:  Procedure Laterality Date  . ABDOMINAL HYSTERECTOMY    . CARDIAC CATHETERIZATION N/A 04/18/2015   Procedure: Left Heart Cath and Coronary Angiography;  Surgeon: [name], MD;  Location: [facility];  Service: Cardiovascular;  Laterality: N/A;  . FOOT SURGERY Bilateral    [place] "bunion,bone spur, tendon" (1) -6'16, (1)-10'16  . IR EPIDUROGRAPHY  07/21/2018  . LUMBAR LAMINECTOMY/DECOMPRESSION MICRODISCECTOMY Bilateral 12/28/2015   Procedure: MICRO LUMBAR DECOMPRESSION L4 - L5 BILATERALLY;  Surgeon: [name], MD;  Location: [facility];  Service: Orthopedics;  Laterality: Bilateral;  . LUMBAR LAMINECTOMY/DECOMPRESSION MICRODISCECTOMY Bilateral 03/04/2018   Procedure: Revision of Microlumbar Decompression Bilateral  Lumbar Four-Five;  Surgeon: [name], MD;  Location: [facility] [facility];  Service: Orthopedics;  Laterality: Bilateral;  90 mins  . WISDOM TOOTH EXTRACTION    . WOUND EXPLORATION N/A 03/04/2018   Procedure: EXPLORATION OF LUMBAR DECOMPRESSION WOUND;  Surgeon: [name], MD;  Location: [facility] [place];  Service: Orthopedics;  Laterality: N/A;    There were no vitals filed for this visit.  Subjective Assessment - 06/23/19 2000    Subjective  Pt presents for aquatic therapy at [facility] - states she is scheduled to have injection in her neck next Wed., 06-29-19    Pertinent History  decompression and laminectomy on 12/28/15 at L4-L5  with redo decompression surgery on May 9,2019, post-op paraparesis with surgery to remove small hematoma; 07/12/18 fall at work (involved rolling chair) with hospital admission, transfer to [place] with d/c home 08/03/18; HTN, Bil foot surgery     Patient Stated Goals  improve walking    Currently in Pain?  Yes    Pain Score  3     Pain Location  Back    Pain Orientation  Right;Left    Pain Descriptors / Indicators  Aching;Tightness;Discomfort    Pain Type  Chronic pain    Pain Onset  More than a month ago    Pain Frequency  Constant            Aquatic therapy at [facility] - pool temp 87.2 degrees  Patient seen for aquatic therapy today.  Treatment took place in water 3.5-4 feet deep depending upon activity.  Pt entered and  Exited the pool via step negotiation with use of bilateral hand rails with CGA for safety.  Pt performed runner's stretch on each leg and heel cord stretch bil. LE's with feet on pool wall - 30 sec hold each stretch x 1 rep at beginning and at end of session  Pt performed amb. 47[place] pool 6 reps forward; backward 1 rep and sideways 2 reps with use of noodle for UE support with therapist holding noodle for stability   Pt performed standing bil.  hip flexion, abduction and hip extension with knee extended with use of aquatic cuff for incr. Resistance for  strengthening - 10 reps each leg with UE support on pool edge for assist with balance;   Marching in place 10 reps each leg with UE support; marching forwards and backwards 244m[place] pool with UE support on pool noodle  Squats x 10 reps with bil. UE support on pool edge   Pt requires aquatic therapy for the unweighting provided by the buoyancy of the water and needs the viscosity for strengthening LE's and trunk musc.; current of water provided perturbations for balance challenges and for trunk stabilization; buoyancy of water also needed for spinal decompression and joint unloading for increased movement with less pain than can be achieved with land exercise                                                PT Short Term Goals - 06/23/19 2010      PT SHORT TERM GOAL #1   Title  Improve TUG score from  47.5 secs to </= 41 secs with use of RW to demo improved mobility and reduced fall risk.    Baseline  52.88 secs - 11-11-18; 47.5 secs with RW on 04-07-19    Time  4    Period  Weeks    Status  New    Target Date  05/07/19      PT SHORT TERM GOAL #2   Title  Pt will increase gait velocity from .71 ft/sec with RW to >/= 1.2 ft/sec with use of RW for incr. gait efficiency.    Baseline  21.66 secs = 1.51 ft/sec with RW - 11-11-18; 45.97 secs = .71 ft/sec with RW on 04-07-19 ; 1" 1 sec initial trial    Time  4    Period  Weeks    Status  New    Target Date  05/07/19      PT SHORT TERM GOAL #3   Title  Perform sit to stand with no UE support and stabilize independently (without bracing LE's against mat table) x 5 reps to demo improved LE strength.    Baseline  Pt braced legs on reps 1 and 2 of 5 reps on 04-07-19    Time  4    Period  Weeks    Status  New    Target Date  05/07/19      PT SHORT TERM GOAL #4   Title  Amb. 115' with [place]strand crutches  with CGA on flat, even surface.    Baseline  pt using RW for assist. with amb.    Time  4    Period   Weeks  Status  New    Target Date  05/07/19      PT SHORT TERM GOAL #5   Title  Independent in updated HEP including aquatic exercise program.    Time  4    Period  Weeks    Status  New    Target Date  05/07/19        PT Long Term Goals - 06/23/19 2009      PT LONG TERM GOAL #1   Title  Improve TUG score to </= 35 secs with RW for reduced fall risk.    Baseline  52.88 secs with RW on 11-11-18; 44.7 secs with 1 Lofstrand crutch on 01-11-19; 47.5 secs    Time  8    Period  Weeks    Status  New      PT LONG TERM GOAL #2   Title  Patient will improve gait velocity to >/= 1.5 ft/sec with Rt AFO and RW, demonstrating lesser fall risk    Baseline  21.66 secs = 1.51 ft/sec with RW;  25.6 secs = 1.28 ft/sec with 1 Lofstrand crutch - 01-12-19; 45.97 secs = .71 ft/sec ft/sec with RW on 04-07-19    Time  8    Period  Weeks    Status  New      PT LONG TERM GOAL #3   Title  Negotiate 4 steps with 1 hand rail using a step by step sequence.    Baseline  step by step sequence with use of hand rail and with 1 Lofstrand crutch - 01-11-19    Time  8    Period  Weeks    Status  New      PT LONG TERM GOAL #4   Title  Modified independent household amb. with use of 1 Lofstrand crutch (with use of AFO on RLE).    Baseline  not met 01-12-19    Time  8    Period  Weeks    Status  New              Patient will benefit from skilled therapeutic intervention in order to improve the following deficits and impairments:     Visit Diagnosis: Other abnormalities of gait and mobility  Muscle weakness (generalized)  Unsteadiness on feet     Problem List Patient Active Problem List   Diagnosis Date Noted  . Lumbar spinal stenosis   . Radicular pain   . Gait disorder   . Difficulty in urination 07/13/2018  . Back pain 07/12/2018  . Hyperlipidemia 04/16/2018  . Generalized anxiety disorder with panic attacks 04/16/2018  . AKI (acute kidney injury) ([place])   . Benign essential HTN   .  Depression with anxiety   . Lumbar radiculopathy 03/09/2018  . Paraparesis ([place]) 03/09/2018  . Essential hypertension   . Chronic nonintractable headache   . Leukocytosis   . Acute blood loss anemia   . Postoperative pain   . Generalized weakness   . Spinal stenosis at L4-L5 level 03/04/2018  . Spinal stenosis of lumbar region 12/28/2015  . Chest pain 04/16/2015    Erin Uecker, [place], PT, [place] 06/23/2019, 8:11 PM  [facility] [address] [place], [name], [address] Phone: [phone]   Fax:  [phone]  Name: [name] MRN: [id number] Date of Birth: 1961-07-19

## 2019-06-25 LAB — TOXASSURE SELECT,+ANTIDEPR,UR

## 2019-06-27 ENCOUNTER — Telehealth: Payer: Self-pay | Admitting: *Deleted

## 2019-06-27 NOTE — Telephone Encounter (Signed)
Urine drug screen for this encounter is consistent for prescribed medication. The concern is she has three benzodiazepines present. She is routinely prescribed alprazolam by her PCP. She received valium #2 for injections by [facility]. But she filled prescriptions for lorazepam 0.5 mg #30/1RF that that was given to her at hospital discharge 08/03/18 by Lavonna Monarch PA on 08/03/18 and 12/24/18.  It is unclear by the PCP note if she is supposed to be taking the two benzos lorazepam and alprazolam together. The valium was a pre procedure medication.

## 2019-06-28 ENCOUNTER — Encounter: Payer: BC Managed Care – PPO | Admitting: Physical Medicine and Rehabilitation

## 2019-06-28 NOTE — Telephone Encounter (Signed)
Please let pt know that she really shouldn't be taking the xanax and ativan together. I can d/w her more at her next visit

## 2019-06-28 NOTE — Telephone Encounter (Signed)
I notified Mrs Deel.

## 2019-06-29 ENCOUNTER — Ambulatory Visit: Payer: BC Managed Care – PPO | Admitting: Physical Therapy

## 2019-06-29 ENCOUNTER — Ambulatory Visit (INDEPENDENT_AMBULATORY_CARE_PROVIDER_SITE_OTHER): Payer: BC Managed Care – PPO | Admitting: Physical Medicine and Rehabilitation

## 2019-06-29 ENCOUNTER — Ambulatory Visit: Payer: BC Managed Care – PPO

## 2019-06-29 ENCOUNTER — Encounter: Payer: Self-pay | Admitting: Physical Medicine and Rehabilitation

## 2019-06-29 VITALS — BP 144/100 | HR 89

## 2019-06-29 DIAGNOSIS — M4802 Spinal stenosis, cervical region: Secondary | ICD-10-CM

## 2019-06-29 DIAGNOSIS — M5412 Radiculopathy, cervical region: Secondary | ICD-10-CM

## 2019-06-29 MED ORDER — METHYLPREDNISOLONE ACETATE 80 MG/ML IJ SUSP
80.0000 mg | Freq: Once | INTRAMUSCULAR | Status: AC
  Administered 2019-06-29: 80 mg

## 2019-06-29 NOTE — Progress Notes (Signed)
   Numeric Pain Rating Scale and Functional Assessment Average Pain (8)   In the last MONTH (on 0-10 scale) has pain interfered with the following?  1. General activity like being  able to carry out your everyday physical activities such as walking, climbing stairs, carrying groceries, or moving a chair?  Rating(9)   +Driver, -BT, -Dye Allergies.  

## 2019-06-30 ENCOUNTER — Encounter: Payer: BC Managed Care – PPO | Admitting: Physical Medicine and Rehabilitation

## 2019-06-30 NOTE — Progress Notes (Signed)
[name] - 58 y.o. female MRN [id number]  Date of birth: 01-18-1961  Office Visit Note: Visit Date: 06/29/2019 PCP: [name], [name], MD Referred by: [name], [name], MD  Subjective: Chief Complaint  Patient presents with   Neck - Pain   HPI: [name] is a 58 y.o. female who comes in today For planned right C7-T1 interlaminar epidural steroid injection for chronic worsening severe neck pain with referral into the right shoulder and arm with paresthesia into the hand.  Starting to get some left-sided symptoms now as well.  She has had multiple MRIs of the cervical thoracic and lumbar MRI over the last few years.  Cervical MRI does show multiple level spondylosis but with moderate central stenosis at C5-6.  This could correspond to a C6 radiculopathy on the right.  Briefly, her case is very complicated in the sense that she has had lumbar surgery by [name] and since she has seen Dr. Wyline Copas who referred her to me for injection she has had consultation with Dr. Melina Schools.  This was all for her low back problems.  She has now also seen her has been seeing Dr. Herma Mering and they are contemplating spinal cord stimulator trial of her lumbar spine.  Identified the issue of her seeing multiple physicians for spine problems before she arrived and my staff contacted her at that point since Dr. Rolena Infante was looking more at her lumbar spine she reports that she wanted to go ahead and come here because it was scheduled for the injection to see if it would help her neck.  My recommendation depending on outcome today would be to follow-up with Dr. Rolena Infante for both her neck and her lumbar spine as that can be managed effectively by 1 physician in 1 physician group.  She agrees with this plan.  She has a complicated course of lumbar surgery and pain management.  She reports allergy to amide anesthetics.  This would include lidocaine and Marcaine.  She has had an injection with Dr. Herma Mering without any  incident.  As of note we did use lidocaine and the injection today without incident.  We also used a small amount of iodinated contrast without incident.  She was premedicated today with Valium.  ROS Otherwise per HPI.  Assessment & Plan: Visit Diagnoses:  1. Cervical radiculopathy   2. Spinal stenosis of cervical region     Plan: No additional findings.   Meds & Orders:  Meds ordered this encounter  Medications   methylPREDNISolone acetate (DEPO-MEDROL) injection 80 mg    Orders Placed This Encounter  Procedures   XR C-ARM NO REPORT   Epidural Steroid injection    Follow-up: Return if symptoms worsen or fail to improve.   Procedures: No procedures performed  Cervical Epidural Steroid Injection - Interlaminar Approach with Fluoroscopic Guidance  Patient: [name]      Date of Birth: 1961-10-27 MRN: [id number] PCP: [name], [name], MD      Visit Date: 06/29/2019   Universal Protocol:    Date/Time: 09/03/206:20 AM  Consent Given By: the patient  Position: PRONE  Additional Comments: Vital signs were monitored before and after the procedure. Patient was prepped and draped in the usual sterile fashion. The correct patient, procedure, and site was verified.   Injection Procedure Details:  Procedure Site One Meds Administered:  Meds ordered this encounter  Medications   methylPREDNISolone acetate (DEPO-MEDROL) injection 80 mg     Laterality: Right  Location/Site:  C7-T1  Needle size: 20 G  Needle type: Touhy  Needle Placement: Paramedian epidural space  Findings:  -Comments: Excellent flow of contrast into the epidural space.  Procedure Details: Using a paramedian approach from the side mentioned above, the region overlying the inferior lamina was localized under fluoroscopic visualization and the soft tissues overlying this structure were infiltrated with 4 ml. of 1% Lidocaine without Epinephrine. A # 20 gauge, Tuohy needle was inserted into  the epidural space using a paramedian approach.  The epidural space was localized using loss of resistance along with lateral and contralateral oblique bi-planar fluoroscopic views.  After negative aspirate for air, blood, and CSF, a 2 ml. volume of Isovue-250 was injected into the epidural space and the flow of contrast was observed. Radiographs were obtained for documentation purposes.   The injectate was administered into the level noted above.  Additional Comments:  The patient tolerated the procedure well Dressing: 2 x 2 sterile gauze and Band-Aid    Post-procedure details: Patient was observed during the procedure. Post-procedure instructions were reviewed.  Patient left the clinic in stable condition.    Clinical History: MRI CERVICAL SPINE WITHOUT CONTRAST  TECHNIQUE: Multiplanar, multisequence MR imaging of the cervical spine was performed. No intravenous contrast was administered.  COMPARISON:  03/08/2018  FINDINGS: Alignment: Physiologic.  Vertebrae: No fracture, evidence of discitis, or bone lesion.  Cord: Normal signal and morphology.  Posterior Fossa, vertebral arteries, paraspinal tissues: Negative.  Disc levels:  C2-C3: No significant disc protrusion, foraminal stenosis, or canal stenosis.  C3-C4: Small posterior disc osteophyte complex and left greater than right facet and uncovertebral arthropathy results in moderate left foraminal stenosis. No canal stenosis. Findings similar to prior.  C4-C5: Posterior disc osteophyte complex with impress upon the ventral cord resulting in mild canal stenosis. Mild bilateral facet arthropathy without foraminal narrowing. Findings similar to prior.  C5-C6: Right paracentral disc osteophyte complex with mild bilateral facet and uncovertebral arthropathy result in impress upon the ventral cord and moderate canal stenosis. There is mild bilateral foraminal stenosis. Findings similar to prior.  C6-C7:  Posterior disc osteophyte complex results in impress upon the ventral thecal sac and minimal canal stenosis. No foraminal stenosis. Findings similar to prior.  C7-T1: No significant disc protrusion, foraminal stenosis, or canal stenosis.  IMPRESSION: Multilevel cervical spondylosis, most pronounced at the C5-6 level where there is moderate canal stenosis and mild bilateral foraminal stenosis. No significant interval progression when compared with prior MRI 03/08/2018.   Electronically Signed   By: Davina Poke M.D.   On: 05/26/2019 14:15   She reports that she has never smoked. She has never used smokeless tobacco. No results for input(s): HGBA1C, LABURIC in the last 8760 hours.  Objective:  VS:  HT:     WT:    BMI:      BP:(!) 144/100   HR:89bpm   TEMP: ( )   RESP:  Physical Exam Musculoskeletal:     Comments: She sits with forward flexed cervical spine pain with any motion of the cervical spine pain with any motion of the right arm.  She has subjective impaired sensation more of a C6 dermatome on the right.  She does come in today in a wheelchair but normally uses a walker.  Wheelchair was utilized because of preprocedure Valium.  She does wear an AFO on the right with right foot drop.     Ortho Exam Imaging: Xr C-arm No Report  Result Date: 06/29/2019 Please see Notes tab for  imaging impression.   Past Medical/Family/Surgical/Social History: Medications & Allergies reviewed per EMR, new medications updated. Patient Active Problem List   Diagnosis Date Noted   Lumbar spinal stenosis    Radicular pain    Gait disorder    Difficulty in urination 07/13/2018   Back pain 07/12/2018   Hyperlipidemia 04/16/2018   Generalized anxiety disorder with panic attacks 04/16/2018   AKI (acute kidney injury) ([place])    Benign essential HTN    Depression with anxiety    Lumbar radiculopathy 03/09/2018   Paraparesis ([place]) 03/09/2018   Essential hypertension     Chronic nonintractable headache    Leukocytosis    Acute blood loss anemia    Postoperative pain    Generalized weakness    Spinal stenosis at L4-L5 level 03/04/2018   Spinal stenosis of lumbar region 12/28/2015   Chest pain 04/16/2015   Past Medical History:  Diagnosis Date   Anxiety    Back pain    related to spinal stenosis and disc problem, radiates down left buttocks to leg., weakness occ.   Dyspnea    Headache    Hyperlipidemia    Hypertension    PONV (postoperative nausea and vomiting)    Vaginal foreign object    "Uses Femring"   Family History  Problem Relation Age of Onset   Heart attack Mother    Lung cancer Father    Cancer Father    Pancreatic cancer Sister    Breast cancer Sister 21   Throat cancer Brother    Multiple myeloma Sister    Breast cancer Sister        diagnosed in her 54's   Heart attack Sister    Stomach cancer Cousin    Colon cancer Neg Hx    Past Surgical History:  Procedure Laterality Date   ABDOMINAL HYSTERECTOMY     CARDIAC CATHETERIZATION N/A 04/18/2015   Procedure: Left Heart Cath and Coronary Angiography;  Surgeon: [name], MD;  Location: [facility];  Service: Cardiovascular;  Laterality: N/A;   FOOT SURGERY Bilateral    [place] "bunion,bone spur, tendon" (1) -6'16, (1)-10'16   IR EPIDUROGRAPHY  07/21/2018   LUMBAR LAMINECTOMY/DECOMPRESSION MICRODISCECTOMY Bilateral 12/28/2015   Procedure: MICRO LUMBAR DECOMPRESSION L4 - L5 BILATERALLY;  Surgeon: [name], MD;  Location[facility];  Service: Orthopedics;  Laterality: Bilateral;   LUMBAR LAMINECTOMY/DECOMPRESSION MICRODISCECTOMY Bilateral 03/04/2018   Procedure: Revision of Microlumbar Decompression Bilateral Lumbar Four-Five;  Surgeon: [name], MD;  Location: [facility];  Service: Orthopedics;  Laterality: Bilateral;  90 mins   WISDOM TOOTH EXTRACTION     WOUND EXPLORATION N/A 03/04/2018   Procedure: EXPLORATION OF LUMBAR  DECOMPRESSION WOUND;  Surgeon: [name], MD;  Location: [facility];  Service: Orthopedics;  Laterality: N/A;   Social History   Occupational History   Not on file  Tobacco Use   Smoking status: Never Smoker   Smokeless tobacco: Never Used  Substance and Sexual Activity   Alcohol use: No   Drug use: No   Sexual activity: Yes    Partners: Male    Birth control/protection: Post-menopausal

## 2019-06-30 NOTE — Procedures (Signed)
Cervical Epidural Steroid Injection - Interlaminar Approach with Fluoroscopic Guidance  Patient: [name]      Date of Birth: 12/06/1960 MRN: [id number] PCP: [name], [name], MD      Visit Date: 06/29/2019   Universal Protocol:    Date/Time: 09/03/206:20 AM  Consent Given By: the patient  Position: PRONE  Additional Comments: Vital signs were monitored before and after the procedure. Patient was prepped and draped in the usual sterile fashion. The correct patient, procedure, and site was verified.   Injection Procedure Details:  Procedure Site One Meds Administered:  Meds ordered this encounter  Medications  . methylPREDNISolone acetate (DEPO-MEDROL) injection 80 mg     Laterality: Right  Location/Site: C7-T1  Needle size: 20 G  Needle type: Touhy  Needle Placement: Paramedian epidural space  Findings:  -Comments: Excellent flow of contrast into the epidural space.  Procedure Details: Using a paramedian approach from the side mentioned above, the region overlying the inferior lamina was localized under fluoroscopic visualization and the soft tissues overlying this structure were infiltrated with 4 ml. of 1% Lidocaine without Epinephrine. A # 20 gauge, Tuohy needle was inserted into the epidural space using a paramedian approach.  The epidural space was localized using loss of resistance along with lateral and contralateral oblique bi-planar fluoroscopic views.  After negative aspirate for air, blood, and CSF, a 2 ml. volume of Isovue-250 was injected into the epidural space and the flow of contrast was observed. Radiographs were obtained for documentation purposes.   The injectate was administered into the level noted above.  Additional Comments:  The patient tolerated the procedure well Dressing: 2 x 2 sterile gauze and Band-Aid    Post-procedure details: Patient was observed during the procedure. Post-procedure instructions were reviewed.  Patient  left the clinic in stable condition.

## 2019-07-05 ENCOUNTER — Ambulatory Visit: Payer: 59 | Admitting: Neurology

## 2019-07-07 DIAGNOSIS — G894 Chronic pain syndrome: Secondary | ICD-10-CM | POA: Diagnosis not present

## 2019-07-13 ENCOUNTER — Other Ambulatory Visit: Payer: Self-pay

## 2019-07-13 ENCOUNTER — Ambulatory Visit: Payer: BC Managed Care – PPO | Attending: Specialist | Admitting: Physical Therapy

## 2019-07-13 DIAGNOSIS — R2681 Unsteadiness on feet: Secondary | ICD-10-CM | POA: Insufficient documentation

## 2019-07-13 DIAGNOSIS — R2689 Other abnormalities of gait and mobility: Secondary | ICD-10-CM | POA: Diagnosis not present

## 2019-07-13 DIAGNOSIS — M6281 Muscle weakness (generalized): Secondary | ICD-10-CM | POA: Insufficient documentation

## 2019-07-14 NOTE — Therapy (Signed)
Physical Therapy Treatment  Patient Details  Name: [name] MRN: [id number] Date of Birth: 12/18/1960 Referring Provider (PT): Meredith Staggers, MD   Encounter Date: 07/13/2019  PT End of Session - 07/14/19 2100    Visit Number  10    Number of Visits  15    Date for PT Re-Evaluation  06/27/19    Authorization Type  BCBS    Authorization - Visit Number  10    Authorization - Number of Visits  30    PT Start Time  1505    PT Stop Time  1550    PT Time Calculation (min)  45 min    Equipment Utilized During Treatment  --   pool noodle, aquatic cuffs   Activity Tolerance  Patient tolerated treatment well    Behavior During Therapy  WFL for tasks assessed/performed       Past Medical History:  Diagnosis Date  . Anxiety   . Back pain    related to spinal stenosis and disc problem, radiates down left buttocks to leg., weakness occ.  [name] Dyspnea   . Headache   . Hyperlipidemia   . Hypertension   . PONV (postoperative nausea and vomiting)   . Vaginal foreign object    "Uses Femring"    Past Surgical History:  Procedure Laterality Date  . ABDOMINAL HYSTERECTOMY    . CARDIAC CATHETERIZATION N/A 04/18/2015   Procedure: Left Heart Cath and Coronary Angiography;  Surgeon: [name], MD;  Location: [facility];  Service: Cardiovascular;  Laterality: N/A;  . FOOT SURGERY Bilateral    [place] "bunion,bone spur, tendon" (1) -6'16, (1)-10'16  . IR EPIDUROGRAPHY  07/21/2018  . LUMBAR LAMINECTOMY/DECOMPRESSION MICRODISCECTOMY Bilateral 12/28/2015   Procedure: MICRO LUMBAR DECOMPRESSION L4 - L5 BILATERALLY;  Surgeon: [name], MD;  Location: [facility];  Service: Orthopedics;  Laterality: Bilateral;  . LUMBAR LAMINECTOMY/DECOMPRESSION MICRODISCECTOMY Bilateral 03/04/2018   Procedure: Revision of Microlumbar Decompression Bilateral Lumbar Four-Five;  Surgeon: [name], MD;  Location: [facility];  Service: Orthopedics;  Laterality:  Bilateral;  90 mins  . WISDOM TOOTH EXTRACTION    . WOUND EXPLORATION N/A 03/04/2018   Procedure: EXPLORATION OF LUMBAR DECOMPRESSION WOUND;  Surgeon: [name], MD;  Location: [facility];  Service: Orthopedics;  Laterality: N/A;    There were no vitals filed for this visit.  Subjective Assessment - 07/14/19 2059    Subjective  Pt presents for aquatic therapy at [facility] - states the injection (epidural) helped the tingling in her hands    Pertinent History  decompression and laminectomy on 12/28/15 at L4-L5  with redo decompression surgery on May 9,2019, post-op paraparesis with surgery to remove small hematoma; 07/12/18 fall at work (involved rolling chair) with hospital admission, transfer to [place] with d/c home 08/03/18; HTN, Bil foot surgery     Patient Stated Goals  improve walking    Currently in Pain?  Yes    Pain Score  4     Pain Location  Back    Pain Orientation  Right;Left;Lower    Pain Descriptors / Indicators  Aching;Tightness;Discomfort    Pain Type  Chronic pain    Pain Onset  More than a month ago    Pain Frequency  Intermittent           Aquatic therapy at [facility] - pool temp 87.4 degrees   Patient seen for aquatic therapy today.  Treatment took  place in water 3.5-4 feet deep depending upon activity.  Pt entered and  Exited the pool via step negotiation with use of bilateral hand rails with CGA for safety.  Pt performed runner's stretch on each leg and heel cord stretch bil. LE's with feet on pool wall - 30 sec hold each stretch x 1 rep Pt performed amb. [place] pool 2 reps forward; sideways 2 reps with use of noodle for UE support with therapist holding noodle for stability   Pt performed standing hip flexion, abduction and hip extension with knee extended -  10 reps each leg with UE support with use of buoyant cuff weight  on pool edge for assist with balance;  Marching in place 10 reps each leg with UE support   Squats x 10 reps with bil. UE support on pool edge Heel  raises 10 reps   Pt requires aquatic therapy for the unweighting provided by the buoyancy of the water and needs the viscosity for strengthening LE's and trunk musc.; current of water provided perturbations for balance challenges and for trunk stabilization                            PT Short Term Goals - 07/14/19 2105      PT SHORT TERM GOAL #1   Title  Improve TUG score from  47.5 secs to </= 41 secs with use of RW to demo improved mobility and reduced fall risk.    Baseline  52.88 secs - 11-11-18; 47.5 secs with RW on 04-07-19    Time  4    Period  Weeks    Status  New    Target Date  05/07/19      PT SHORT TERM GOAL #2   Title  Pt will increase gait velocity from .71 ft/sec with RW to >/= 1.2 ft/sec with use of RW for incr. gait efficiency.    Baseline  21.66 secs = 1.51 ft/sec with RW - 11-11-18; 45.97 secs = .71 ft/sec with RW on 04-07-19 ; 1" 1 sec initial trial    Time  4    Period  Weeks    Status  New    Target Date  05/07/19      PT SHORT TERM GOAL #3   Title  Perform sit to stand with no UE support and stabilize independently (without bracing LE's against mat table) x 5 reps to demo improved LE strength.    Baseline  Pt braced legs on reps 1 and 2 of 5 reps on 04-07-19    Time  4    Period  Weeks    Status  New    Target Date  05/07/19      PT SHORT TERM GOAL #4   Title  Amb. 115' with Lofstrand crutches  with CGA on flat, even surface.    Baseline  pt using RW for assist. with amb.    Time  4    Period  Weeks    Status  New    Target Date  05/07/19      PT SHORT TERM GOAL #5   Title  Independent in updated HEP including aquatic exercise program.    Time  4    Period  Weeks    Status  New    Target Date  05/07/19        PT Long Term Goals - 07/14/19 2105      PT  LONG TERM GOAL #1   Title  Improve TUG score to </= 35 secs with RW for reduced fall risk.    Baseline  52.88 secs with RW on 11-11-18; 44.7 secs with 1 Lofstrand crutch on  01-11-19; 47.5 secs    Time  8    Period  Weeks    Status  New      PT LONG TERM GOAL #2   Title  Patient will improve gait velocity to >/= 1.5 ft/sec with Rt AFO and RW, demonstrating lesser fall risk    Baseline  21.66 secs = 1.51 ft/sec with RW;  25.6 secs = 1.28 ft/sec with 1 Lofstrand crutch - 01-12-19; 45.97 secs = .71 ft/sec ft/sec with RW on 04-07-19    Time  8    Period  Weeks    Status  New      PT LONG TERM GOAL #3   Title  Negotiate 4 steps with 1 hand rail using a step by step sequence.    Baseline  step by step sequence with use of hand rail and with 1 Lofstrand crutch - 01-11-19    Time  8    Period  Weeks    Status  New      PT LONG TERM GOAL #4   Title  Modified independent household amb. with use of 1 Lofstrand crutch (with use of AFO on RLE).    Baseline  not met 01-12-19    Time  8    Period  Weeks    Status  New            Plan - 07/14/19 2101    Clinical Impression Statement  Pt continues to have spasticity in RLE resulting in gait deviations including decreased Rt hip and knee flexion with decreased Rt dorsiflexion; pt reports decreased back pain after aquatic therapy session completed.    PT Frequency  1x / week    PT Duration  8 weeks    PT Next Visit Plan  cont aquatic therapy for back pain and for improved mobility    PT Home Exercise Plan  seated ab sets and ball squeezes; leg lengthener and leg presses , standing static balance without UE; added runner's stretch and heel cord stretch to HEP    Consulted and Agree with Plan of Care  Patient       Patient will benefit from skilled therapeutic intervention in order to improve the following deficits and impairments:  Pain, Postural dysfunction, Difficulty walking, Decreased strength, Decreased activity tolerance, Abnormal gait, Increased muscle spasms, Decreased endurance, Decreased balance, Decreased coordination, Decreased knowledge of use of DME, Decreased mobility, Decreased safety awareness,  Impaired sensation, Impaired tone  Visit Diagnosis: Other abnormalities of gait and mobility  Muscle weakness (generalized)     Problem List Patient Active Problem List   Diagnosis Date Noted  . Lumbar spinal stenosis   . Radicular pain   . Gait disorder   . Difficulty in urination 07/13/2018  . Back pain 07/12/2018  . Hyperlipidemia 04/16/2018  . Generalized anxiety disorder with panic attacks 04/16/2018  . AKI (acute kidney injury) ([place])   . Benign essential HTN   . Depression with anxiety   . Lumbar radiculopathy 03/09/2018  . Paraparesis ([place]) 03/09/2018  . Essential hypertension   . Chronic nonintractable headache   . Leukocytosis   . Acute blood loss anemia   . Postoperative pain   . Generalized weakness   . Spinal stenosis at L4-L5 level 03/04/2018  .  Spinal stenosis of lumbar region 12/28/2015  . Chest pain 04/16/2015    Dezarai Prew, [place], PT, [place] 07/14/2019, 9:05 PM  [facility] [address] [place], [name], [address] Phone: [phone]   Fax:  [phone]  Name: [name] MRN: [id number] Date of Birth: 1961-05-22

## 2019-07-20 ENCOUNTER — Ambulatory Visit: Payer: BC Managed Care – PPO | Admitting: Physical Therapy

## 2019-07-20 ENCOUNTER — Other Ambulatory Visit: Payer: Self-pay

## 2019-07-20 DIAGNOSIS — R2681 Unsteadiness on feet: Secondary | ICD-10-CM

## 2019-07-20 DIAGNOSIS — M6281 Muscle weakness (generalized): Secondary | ICD-10-CM

## 2019-07-20 DIAGNOSIS — R2689 Other abnormalities of gait and mobility: Secondary | ICD-10-CM | POA: Diagnosis not present

## 2019-07-20 MED FILL — traMADol HCL 50 MG TABS: 50 | 30 days supply | Qty: 60 | Fill #1

## 2019-07-21 DIAGNOSIS — G894 Chronic pain syndrome: Secondary | ICD-10-CM | POA: Diagnosis not present

## 2019-07-21 NOTE — Therapy (Signed)
[facility] [address] [place], [name], [address] Phone: [phone]   Fax:  [phone]  Physical Therapy Treatment  Patient Details  Name: [name] MRN: [id number] Date of Birth: 11/21/1960 Referring Provider (PT): Meredith Staggers, MD   Encounter Date: 07/20/2019  PT End of Session - 07/20/19 2120    Visit Number  11    Number of Visits  15    Date for PT Re-Evaluation  06/27/19    Authorization Type  BCBS    Authorization - Visit Number  11    Authorization - Number of Visits  30    PT Start Time  1505    PT Stop Time  1550    PT Time Calculation (min)  45 min    Equipment Utilized During Treatment  Other (comment)   pool noodle   Activity Tolerance  Patient tolerated treatment well    Behavior During Therapy  [facility] for tasks assessed/performed       Past Medical History:  Diagnosis Date  . Anxiety   . Back pain    related to spinal stenosis and disc problem, radiates down left buttocks to leg., weakness occ.  [name] Dyspnea   . Headache   . Hyperlipidemia   . Hypertension   . PONV (postoperative nausea and vomiting)   . Vaginal foreign object    "Uses Femring"    Past Surgical History:  Procedure Laterality Date  . ABDOMINAL HYSTERECTOMY    . CARDIAC CATHETERIZATION N/A 04/18/2015   Procedure: Left Heart Cath and Coronary Angiography;  Surgeon: [name], MD;  Location: [facility];  Service: Cardiovascular;  Laterality: N/A;  . FOOT SURGERY Bilateral    [place] "bunion,bone spur, tendon" (1) -6'16, (1)-10'16  . IR EPIDUROGRAPHY  07/21/2018  . LUMBAR LAMINECTOMY/DECOMPRESSION MICRODISCECTOMY Bilateral 12/28/2015   Procedure: MICRO LUMBAR DECOMPRESSION L4 - L5 BILATERALLY;  Surgeon: [name], MD;  [facility];  Service: Orthopedics;  Laterality: Bilateral;  . LUMBAR LAMINECTOMY/DECOMPRESSION MICRODISCECTOMY Bilateral 03/04/2018   Procedure: Revision of Microlumbar  Decompression Bilateral Lumbar Four-Five;  Surgeon: [name], MD;  Location: [facility];  Service: Orthopedics;  Laterality: Bilateral;  90 mins  . WISDOM TOOTH EXTRACTION    . WOUND EXPLORATION N/A 03/04/2018   Procedure: EXPLORATION OF LUMBAR DECOMPRESSION WOUND;  Surgeon: [name], MD;  Location: [facility];  Service: Orthopedics;  Laterality: N/A;    There were no vitals filed for this visit.  Subjective Assessment - 07/20/19 2117    Subjective  Pt presents for aquatic therapy at [facility] - reports she is having more back pain today - took pain medication earlier today; feels more pain on Lt side of back than on Rt side where she typically feels the discomfort    Currently in Pain?  Yes    Pain Score  8     Pain Location  Back    Pain Orientation  Right;Left    Pain Descriptors / Indicators  Aching;Discomfort;Dull;Grimacing;Tightness    Pain Type  Chronic pain    Pain Onset  More than a month ago    Pain Frequency  Intermittent           Aquatic therapy at [facility] - pool temp 87.6 degrees   Patient seen for aquatic therapy today.  Treatment took place in water 3.5-4 feet deep depending upon activity.  Pt entered and  Exited the pool via step negotiation with use of bilateral hand rails with  CGA for safety.  Pt performed runner's stretch on each leg and heel cord stretch bil. LE's with feet on pool wall - 30 sec hold each stretch x 1 rep Pt performed amb. [place] pool 2 reps forwards; sideways amb. 242m 2 reps with use of noodle for UE support with noodle stabilized by PT  Pt performed backwards amb. With UE support on noodle - approx.. 20' and then RLE tremored, resulting in pt losing balance and required mod assist for return to upright - anxiety appears to play a part in LOB in pool  Pt performed standing hip flexion, abduction and hip extension with knee extended -  10 reps each leg with UE support on pool edge for assist with balance; Marching in place 10 reps each leg with UE  support   Pelvic tilts attempted in standing with pt against pool wall but pt stated this exercise caused incr. LBP was it was discontinued     Pt requires aquatic therapy for the unweighting provided by the buoyancy of the water and for spinal decompression for reduced pain with exercises:  pt needs the viscosity of water for strengthening LE's and trunk musc.; current of water provided perturbations for balance challenges and for trunk stabilization                                   PT Short Term Goals - 07/14/19 2105      PT SHORT TERM GOAL #1   Title  Improve TUG score from  47.5 secs to </= 41 secs with use of RW to demo improved mobility and reduced fall risk.    Baseline  52.88 secs - 11-11-18; 47.5 secs with RW on 04-07-19    Time  4    Period  Weeks    Status  New    Target Date  05/07/19      PT SHORT TERM GOAL #2   Title  Pt will increase gait velocity from .71 ft/sec with RW to >/= 1.2 ft/sec with use of RW for incr. gait efficiency.    Baseline  21.66 secs = 1.51 ft/sec with RW - 11-11-18; 45.97 secs = .71 ft/sec with RW on 04-07-19 ; 1" 1 sec initial trial    Time  4    Period  Weeks    Status  New    Target Date  05/07/19      PT SHORT TERM GOAL #3   Title  Perform sit to stand with no UE support and stabilize independently (without bracing LE's against mat table) x 5 reps to demo improved LE strength.    Baseline  Pt braced legs on reps 1 and 2 of 5 reps on 04-07-19    Time  4    Period  Weeks    Status  New    Target Date  05/07/19      PT SHORT TERM GOAL #4   Title  Amb. 115' with Lofstrand crutches  with CGA on flat, even surface.    Baseline  pt using RW for assist. with amb.    Time  4    Period  Weeks    Status  New    Target Date  05/07/19      PT SHORT TERM GOAL #5   Title  Independent in updated HEP including aquatic exercise program.    Time  4    Period  Weeks    Status  New    Target Date  05/07/19        PT  Long Term Goals - 07/21/19 2054      PT LONG TERM GOAL #1   Title  Improve TUG score to </= 35 secs with RW for reduced fall risk.    Baseline  52.88 secs with RW on 11-11-18; 44.7 secs with 1 Lofstrand crutch on 01-11-19; 47.5 secs    Time  8    Period  Weeks    Status  New    Target Date  07/27/19      PT LONG TERM GOAL #2   Title  Patient will improve gait velocity to >/= 1.5 ft/sec with Rt AFO and RW, demonstrating lesser fall risk    Baseline  21.66 secs = 1.51 ft/sec with RW;  25.6 secs = 1.28 ft/sec with 1 Lofstrand crutch - 01-12-19; 45.97 secs = .71 ft/sec ft/sec with RW on 04-07-19    Time  8    Period  Weeks    Status  New    Target Date  07/27/19      PT LONG TERM GOAL #3   Title  Negotiate 4 steps with 1 hand rail using a step by step sequence.    Baseline  step by step sequence with use of hand rail and with 1 Lofstrand crutch - 01-11-19    Time  8    Period  Weeks    Status  New    Target Date  07/27/19      PT LONG TERM GOAL #4   Title  Modified independent household amb. with use of 1 Lofstrand crutch (with use of AFO on RLE).    Baseline  not met 01-12-19    Time  8    Period  Weeks    Status  New    Target Date  07/27/19            Plan - 07/21/19 0919    Clinical Impression Statement  Pt had increased RLE weakness in water with aquatic exercises today - pt had 1 significant tremor in RLE which resulted in pt losing balance due to anxiety, requiring mod assist to recover to upright standing.  Pt reporting new pain location in back today - stated she felt it more on her left side than she did on the right side where she usually feels the pain.    PT Frequency  1x / week    PT Duration  8 weeks    PT Next Visit Plan  cont aquatic therapy for back pain and for improved mobility    PT Home Exercise Plan  seated ab sets and ball squeezes; leg lengthener and leg presses , standing static balance without UE; added runner's stretch and heel cord stretch to HEP     Consulted and Agree with Plan of Care  Patient       Patient will benefit from skilled therapeutic intervention in order to improve the following deficits and impairments:  Pain, Postural dysfunction, Difficulty walking, Decreased strength, Decreased activity tolerance, Abnormal gait, Increased muscle spasms, Decreased endurance, Decreased balance, Decreased coordination, Decreased knowledge of use of DME, Decreased mobility, Decreased safety awareness, Impaired sensation, Impaired tone  Visit Diagnosis: Other abnormalities of gait and mobility  Muscle weakness (generalized)  Unsteadiness on feet     Problem List Patient Active Problem List   Diagnosis Date Noted  . Lumbar spinal stenosis   .  Radicular pain   . Gait disorder   . Difficulty in urination 07/13/2018  . Back pain 07/12/2018  . Hyperlipidemia 04/16/2018  . Generalized anxiety disorder with panic attacks 04/16/2018  . AKI (acute kidney injury) ([place])   . Benign essential HTN   . Depression with anxiety   . Lumbar radiculopathy 03/09/2018  . Paraparesis ([place]) 03/09/2018  . Essential hypertension   . Chronic nonintractable headache   . Leukocytosis   . Acute blood loss anemia   . Postoperative pain   . Generalized weakness   . Spinal stenosis at L4-L5 level 03/04/2018  . Spinal stenosis of lumbar region 12/28/2015  . Chest pain 04/16/2015    [name], [place], [place], [place] 07/21/2019, 8:55 PM  [facility] [address] [place], [name], [address] Phone: [phone]   Fax:  [phone]  Name: [name] MRN: [id number] Date of Birth: 11-24-1960

## 2019-07-25 ENCOUNTER — Telehealth: Payer: Self-pay | Admitting: Physical Medicine & Rehabilitation

## 2019-07-25 NOTE — Telephone Encounter (Signed)
Patient having weakness in right leg, getting weaker.  Patient feel yesterday.  Patient said that she doesn't have any feeling in top part of leg now.  Patient would like to know what she should do.  Please call patient.

## 2019-07-25 NOTE — Telephone Encounter (Signed)
If the leg has become that weak, she should be contacting her surgeon as he's been following her most closely.

## 2019-07-25 NOTE — Telephone Encounter (Signed)
Spoke with patient to let her know what Dr. Naaman Plummer has suggested, and patient verbalized and said she would call surgeon.

## 2019-07-26 MED FILL — CARVEDILOL 6.25 MG TABLET: 6.25 | 90 days supply | Qty: 180 | Fill #0

## 2019-07-27 ENCOUNTER — Encounter: Payer: Self-pay | Admitting: Physical Therapy

## 2019-07-27 ENCOUNTER — Other Ambulatory Visit: Payer: Self-pay

## 2019-07-27 ENCOUNTER — Ambulatory Visit: Payer: BC Managed Care – PPO | Admitting: Physical Therapy

## 2019-07-27 DIAGNOSIS — M6281 Muscle weakness (generalized): Secondary | ICD-10-CM | POA: Diagnosis not present

## 2019-07-27 DIAGNOSIS — R2681 Unsteadiness on feet: Secondary | ICD-10-CM | POA: Diagnosis not present

## 2019-07-27 DIAGNOSIS — R2689 Other abnormalities of gait and mobility: Secondary | ICD-10-CM

## 2019-07-27 NOTE — Therapy (Signed)
[facility] [address] [place], [name], [address] Phone: [phone]   Fax:  [phone]  Physical Therapy Treatment  Patient Details  Name: [name] MRN: [id number] Date of Birth: 1961-08-24 Referring Provider (PT): Meredith Staggers, MD   Encounter Date: 07/27/2019  PT End of Session - 07/27/19 1927    Visit Number  12    Number of Visits  15    Date for PT Re-Evaluation  07/27/19    Authorization Type  BCBS    Authorization - Visit Number  12    Authorization - Number of Visits  30    PT Start Time  1330    PT Stop Time  1415    PT Time Calculation (min)  45 min    Equipment Utilized During Treatment  Other (comment)   pool noodle   Activity Tolerance  Patient tolerated treatment well    Behavior During Therapy  [facility] for tasks assessed/performed       Past Medical History:  Diagnosis Date  . Anxiety   . Back pain    related to spinal stenosis and disc problem, radiates down left buttocks to leg., weakness occ.  [name] Dyspnea   . Headache   . Hyperlipidemia   . Hypertension   . PONV (postoperative nausea and vomiting)   . Vaginal foreign object    "Uses Femring"    Past Surgical History:  Procedure Laterality Date  . ABDOMINAL HYSTERECTOMY    . CARDIAC CATHETERIZATION N/A 04/18/2015   Procedure: Left Heart Cath and Coronary Angiography;  Surgeon: [name], MD;  Location: [place] [facility];  Service: Cardiovascular;  Laterality: N/A;  . FOOT SURGERY Bilateral    [place] "bunion,bone spur, tendon" (1) -6'16, (1)-10'16  . IR EPIDUROGRAPHY  07/21/2018  . LUMBAR LAMINECTOMY/DECOMPRESSION MICRODISCECTOMY Bilateral 12/28/2015   Procedure: MICRO LUMBAR DECOMPRESSION L4 - L5 BILATERALLY;  Surgeon: [name], MD;  Location: [facility];  Service: Orthopedics;  Laterality: Bilateral;  . LUMBAR LAMINECTOMY/DECOMPRESSION MICRODISCECTOMY Bilateral 03/04/2018   Procedure: Revision of Microlumbar  Decompression Bilateral Lumbar Four-Five;  Surgeon: [name], MD;  Location: [facility];  Service: Orthopedics;  Laterality: Bilateral;  90 mins  . WISDOM TOOTH EXTRACTION    . WOUND EXPLORATION N/A 03/04/2018   Procedure: EXPLORATION OF LUMBAR DECOMPRESSION WOUND;  Surgeon: [name], MD;  Location: [facility];  Service: Orthopedics;  Laterality: N/A;    There were no vitals filed for this visit.  Subjective Assessment - 07/27/19 1924    Subjective  Pt presents for aquatic therapy at [facility] - states she has had severe pain in Rt low back/RLE since Sunday - contacted her MD on Monday - they are supposed to be getting in touch with insurance co. for spinal stimulator approval; pt rates pain 9/10 at start of session    Pertinent History  decompression and laminectomy on 12/28/15 at L4-L5  with redo decompression surgery on May 9,2019, post-op paraparesis with surgery to remove small hematoma; 07/12/18 fall at work (involved rolling chair) with hospital admission, transfer to [place] with d/c home 08/03/18; HTN, Bil foot surgery     Patient Stated Goals  improve walking    Currently in Pain?  Yes    Pain Score  9     Pain Location  Back    Pain Orientation  Right    Pain Descriptors / Indicators  Aching;Discomfort;Grimacing;Penetrating;Throbbing;Moaning;Crying    Pain Type  Chronic pain    Pain Onset  More than a month ago    Pain Frequency  Constant   varies in intensity        Aquatic therapy at [facility] - pool temp 87.4 degrees   Patient seen for aquatic therapy today.  Treatment took place in water 3.5-4 feet deep depending upon activity.  Pt entered and  Exited the pool via step negotiation with use of bilateral hand rails with min to mod assist for safety.  Pt performed runner's stretch on each leg and heel cord stretch bil. LE's with feet on pool wall - 30 sec hold each stretch x 1 rep Pt performed amb. [place] pool 2 reps forward; sideways 2 reps with use of noodle for UE support with  therapist holding noodle for stability   Pt performed standing hip flexion, abduction and hip extension with knee extended -  10 reps each leg with UE support  Marching in place 10 reps each leg with UE support  Small range of pelvic tilts against pool wall - 5 reps with 2 sec hold Squats x 10 reps with bil. UE support on pool edge    Pt requires aquatic therapy for the unweighting provided by the buoyancy of the water and needs the viscosity for strengthening LE's and trunk musc.; buoyancy of water needed for spinal decompression for increased tolerance for movement with less pain experienced than with land exercises                                     PT Short Term Goals - 07/14/19 2105      PT SHORT TERM GOAL #1   Title  Improve TUG score from  47.5 secs to </= 41 secs with use of RW to demo improved mobility and reduced fall risk.    Baseline  52.88 secs - 11-11-18; 47.5 secs with RW on 04-07-19    Time  4    Period  Weeks    Status  New    Target Date  05/07/19      PT SHORT TERM GOAL #2   Title  Pt will increase gait velocity from .71 ft/sec with RW to >/= 1.2 ft/sec with use of RW for incr. gait efficiency.    Baseline  21.66 secs = 1.51 ft/sec with RW - 11-11-18; 45.97 secs = .71 ft/sec with RW on 04-07-19 ; 1" 1 sec initial trial    Time  4    Period  Weeks    Status  New    Target Date  05/07/19      PT SHORT TERM GOAL #3   Title  Perform sit to stand with no UE support and stabilize independently (without bracing LE's against mat table) x 5 reps to demo improved LE strength.    Baseline  Pt braced legs on reps 1 and 2 of 5 reps on 04-07-19    Time  4    Period  Weeks    Status  New    Target Date  05/07/19      PT SHORT TERM GOAL #4   Title  Amb. 115' with Lofstrand crutches  with CGA on flat, even surface.    Baseline  pt using RW for assist. with amb.    Time  4    Period  Weeks    Status  New    Target Date  05/07/19      PT  SHORT TERM GOAL #5   Title  Independent in updated HEP including aquatic exercise program.    Time  4    Period  Weeks    Status  New    Target Date  05/07/19        PT Long Term Goals - 07/21/19 2054      PT LONG TERM GOAL #1   Title  Improve TUG score to </= 35 secs with RW for reduced fall risk.    Baseline  52.88 secs with RW on 11-11-18; 44.7 secs with 1 Lofstrand crutch on 01-11-19; 47.5 secs    Time  8    Period  Weeks    Status  New    Target Date  07/27/19      PT LONG TERM GOAL #2   Title  Patient will improve gait velocity to >/= 1.5 ft/sec with Rt AFO and RW, demonstrating lesser fall risk    Baseline  21.66 secs = 1.51 ft/sec with RW;  25.6 secs = 1.28 ft/sec with 1 Lofstrand crutch - 01-12-19; 45.97 secs = .71 ft/sec ft/sec with RW on 04-07-19    Time  8    Period  Weeks    Status  New    Target Date  07/27/19      PT LONG TERM GOAL #3   Title  Negotiate 4 steps with 1 hand rail using a step by step sequence.    Baseline  step by step sequence with use of hand rail and with 1 Lofstrand crutch - 01-11-19    Time  8    Period  Weeks    Status  New    Target Date  07/27/19      PT LONG TERM GOAL #4   Title  Modified independent household amb. with use of 1 Lofstrand crutch (with use of AFO on RLE).    Baseline  not met 01-12-19    Time  8    Period  Weeks    Status  New    Target Date  07/27/19            Plan - 07/27/19 1928    Clinical Impression Statement  Pt continues to exhibit RLE weakness and gait deviations with pt using RW for assistance with amb. - pt c/o tightness in Rt quads today for first time during any aquatic therapy sessions.  Aquatic cuffs not used due to pt unable to tolerate increased resistance other than viscosity of the water due to c/o pain in RLE.    PT Frequency  1x / week    PT Duration  8 weeks    PT Next Visit Plan  cont aquatic therapy for back pain and for improved mobility    PT Home Exercise Plan  seated ab sets and ball  squeezes; leg lengthener and leg presses , standing static balance without UE; added runner's stretch and heel cord stretch to HEP    Consulted and Agree with Plan of Care  Patient       Patient will benefit from skilled therapeutic intervention in order to improve the following deficits and impairments:  Pain, Postural dysfunction, Difficulty walking, Decreased strength, Decreased activity tolerance, Abnormal gait, Increased muscle spasms, Decreased endurance, Decreased balance, Decreased coordination, Decreased knowledge of use of DME, Decreased mobility, Decreased safety awareness, Impaired sensation, Impaired tone  Visit Diagnosis: Other abnormalities of gait and mobility  Muscle weakness (generalized)  Unsteadiness on feet     Problem List Patient  Active Problem List   Diagnosis Date Noted  . Lumbar spinal stenosis   . Radicular pain   . Gait disorder   . Difficulty in urination 07/13/2018  . Back pain 07/12/2018  . Hyperlipidemia 04/16/2018  . Generalized anxiety disorder with panic attacks 04/16/2018  . AKI (acute kidney injury) ([place])   . Benign essential HTN   . Depression with anxiety   . Lumbar radiculopathy 03/09/2018  . Paraparesis ([place]) 03/09/2018  . Essential hypertension   . Chronic nonintractable headache   . Leukocytosis   . Acute blood loss anemia   . Postoperative pain   . Generalized weakness   . Spinal stenosis at L4-L5 level 03/04/2018  . Spinal stenosis of lumbar region 12/28/2015  . Chest pain 04/16/2015    Shonice Wrisley, [place], PT, [place] 07/27/2019, 7:32 PM  [facility] [address] [place], [name], [address] Phone: [phone]   Fax:  [phone]  Name: [name] MRN: [id number] Date of Birth: 1961-01-20

## 2019-07-29 DIAGNOSIS — M545 Low back pain: Secondary | ICD-10-CM | POA: Diagnosis not present

## 2019-07-29 MED FILL — predniSONE 5 MG (21) TBPK: 5 | 6 days supply | Qty: 21 | Fill #0

## 2019-08-01 ENCOUNTER — Emergency Department (HOSPITAL_BASED_OUTPATIENT_CLINIC_OR_DEPARTMENT_OTHER)
Admission: EM | Admit: 2019-08-01 | Discharge: 2019-08-01 | Disposition: A | Discharge status: Home / Self Care | Payer: BC Managed Care – PPO | Attending: Emergency Medicine | Admitting: Emergency Medicine

## 2019-08-01 ENCOUNTER — Other Ambulatory Visit: Payer: Self-pay

## 2019-08-01 ENCOUNTER — Encounter (HOSPITAL_BASED_OUTPATIENT_CLINIC_OR_DEPARTMENT_OTHER): Payer: Self-pay | Admitting: Emergency Medicine

## 2019-08-01 DIAGNOSIS — M79651 Pain in right thigh: Secondary | ICD-10-CM | POA: Diagnosis not present

## 2019-08-01 DIAGNOSIS — Z79899 Other long term (current) drug therapy: Secondary | ICD-10-CM | POA: Diagnosis not present

## 2019-08-01 DIAGNOSIS — Z91040 Latex allergy status: Secondary | ICD-10-CM | POA: Insufficient documentation

## 2019-08-01 DIAGNOSIS — Z888 Allergy status to other drugs, medicaments and biological substances status: Secondary | ICD-10-CM | POA: Insufficient documentation

## 2019-08-01 DIAGNOSIS — G8929 Other chronic pain: Secondary | ICD-10-CM | POA: Diagnosis not present

## 2019-08-01 DIAGNOSIS — Z880 Allergy status to penicillin: Secondary | ICD-10-CM | POA: Diagnosis not present

## 2019-08-01 DIAGNOSIS — M5416 Radiculopathy, lumbar region: Secondary | ICD-10-CM | POA: Insufficient documentation

## 2019-08-01 DIAGNOSIS — E785 Hyperlipidemia, unspecified: Secondary | ICD-10-CM | POA: Insufficient documentation

## 2019-08-01 DIAGNOSIS — I10 Essential (primary) hypertension: Secondary | ICD-10-CM | POA: Insufficient documentation

## 2019-08-01 DIAGNOSIS — M545 Low back pain: Secondary | ICD-10-CM | POA: Diagnosis not present

## 2019-08-01 DIAGNOSIS — M79604 Pain in right leg: Secondary | ICD-10-CM | POA: Diagnosis not present

## 2019-08-01 DIAGNOSIS — M5417 Radiculopathy, lumbosacral region: Secondary | ICD-10-CM | POA: Diagnosis not present

## 2019-08-01 DIAGNOSIS — Z91018 Allergy to other foods: Secondary | ICD-10-CM | POA: Diagnosis not present

## 2019-08-01 MED ORDER — TRAMADOL HCL 50 MG PO TABS
50.0000 mg | ORAL_TABLET | Freq: Once | ORAL | Status: AC
  Administered 2019-08-01: 50 mg via ORAL
  Filled 2019-08-01: qty 1

## 2019-08-01 MED FILL — GABAPENTIN 300 MG CAPSULE: 300 | 30 days supply | Qty: 90 | Fill #0

## 2019-08-01 NOTE — ED Notes (Signed)
ED Provider at bedside. 

## 2019-08-01 NOTE — ED Provider Notes (Addendum)
[facility] EMERGENCY DEPARTMENT Provider Note   CSN: [id number] Arrival date & time: 08/01/19  1159     History   Chief Complaint Chief Complaint  Patient presents with  . Leg Pain    HPI [name] is a 58 y.o. female with PMHx anxiety, chronic back pain, HTN, HLD, who presents to the ED today for gradual onset, constant, unchanged, radicular pain/"decreased sensation" to RLE x 4 weeks. States she was seen by [name] on 10/2 for the symptoms. It was believed to be radicular pain following the L4 nerve root believed to be related to her progressive disc degeneration. She received an SI joint injection on the right side and was told to follow-up for further eval on 10/6.  She states that her pain improved for about a day but returned the next day.  She states that they wanted to try another injection but she does not think it will work as she is tired of the pain prompting her to come to the ED today.  Denies fever, chills, saddle anesthesia, urinary retention, urinary or bowel incontinence, worsening paresthesias, or any other associated symptoms.         Past Medical History:  Diagnosis Date  . Anxiety   . Back pain    related to spinal stenosis and disc problem, radiates down left buttocks to leg., weakness occ.  [name] Dyspnea   . Headache   . Hyperlipidemia   . Hypertension   . PONV (postoperative nausea and vomiting)   . Vaginal foreign object    "Uses Femring"    Patient Active Problem List   Diagnosis Date Noted  . Lumbar spinal stenosis   . Radicular pain   . Gait disorder   . Difficulty in urination 07/13/2018  . Back pain 07/12/2018  . Hyperlipidemia 04/16/2018  . Generalized anxiety disorder with panic attacks 04/16/2018  . AKI (acute kidney injury) ([place])   . Benign essential HTN   . Depression with anxiety   . Lumbar radiculopathy 03/09/2018  . Paraparesis ([place]) 03/09/2018  . Essential hypertension   . Chronic nonintractable headache    . Leukocytosis   . Acute blood loss anemia   . Postoperative pain   . Generalized weakness   . Spinal stenosis at L4-L5 level 03/04/2018  . Spinal stenosis of lumbar region 12/28/2015  . Chest pain 04/16/2015    Past Surgical History:  Procedure Laterality Date  . ABDOMINAL HYSTERECTOMY    . CARDIAC CATHETERIZATION N/A 04/18/2015   Procedure: Left Heart Cath and Coronary Angiography;  Surgeon: [name], MD;  Location: [facility];  Service: Cardiovascular;  Laterality: N/A;  . FOOT SURGERY Bilateral    [place] "bunion,bone spur, tendon" (1) -6'16, (1)-10'16  . IR EPIDUROGRAPHY  07/21/2018  . LUMBAR LAMINECTOMY/DECOMPRESSION MICRODISCECTOMY Bilateral 12/28/2015   Procedure: MICRO LUMBAR DECOMPRESSION L4 - L5 BILATERALLY;  Surgeon: [name], MD;  Location: [facility];  Service: Orthopedics;  Laterality: Bilateral;  . LUMBAR LAMINECTOMY/DECOMPRESSION MICRODISCECTOMY Bilateral 03/04/2018   Procedure: Revision of Microlumbar Decompression Bilateral Lumbar Four-Five;  Surgeon: [name], MD;  Location: [facility];  Service: Orthopedics;  Laterality: Bilateral;  90 mins  . WISDOM TOOTH EXTRACTION    . WOUND EXPLORATION N/A 03/04/2018   Procedure: EXPLORATION OF LUMBAR DECOMPRESSION WOUND;  Surgeon: [name], MD;  Location: [facility];  Service: Orthopedics;  Laterality: N/A;     OB History   No obstetric history on file.      Home  Medications    Prior to Admission medications   Medication Sig Start Date End Date Taking? Authorizing Provider  atorvastatin (LIPITOR) 20 MG tablet Take 1 tablet (20 mg total) by mouth daily. 02/15/19  Yes [name], [name], MD  baclofen (LIORESAL) 10 MG tablet Take 1 tablet (10 mg total) by mouth 3 (three) times daily. 10/04/18  Yes Meredith Staggers, MD  carvedilol (COREG) 6.25 MG tablet Take 1 tablet (6.25 mg total) by mouth 2 (two) times daily. 02/21/19  Yes [name], [name], MD  gabapentin (NEURONTIN) 600 MG tablet Take 1 tablet (600 mg  total) by mouth 4 (four) times daily. 02/08/19  Yes Meredith Staggers, MD  traMADol (ULTRAM) 50 MG tablet TAKE 1 TABLET (50 MG TOTAL) BY MOUTH EVERY 12 HOURS. 06/22/19  Yes Meredith Staggers, MD  acetaminophen (TYLENOL) 325 MG tablet Take 1-2 tablets (325-650 mg total) by mouth every 4 (four) hours as needed for mild pain. 03/19/18   Love, Ivan Anchors, PA-C  ALPRAZolam (XANAX) 0.25 MG tablet TAKE 1 TABLET BY MOUTH ONCE DAILY AS NEEDED FOR ANXIETY OR SLEEP 02/15/19   [name], [name], MD  diclofenac sodium (VOLTAREN) 1 % GEL Apply 2 g topically 3 (three) times daily. 08/03/18   Angiulli, Lavon Paganini, PA-C  Estradiol Acetate (FEMRING) 0.05 MG/24HR RING Place vaginally.    [provider]  polyethylene glycol (MIRALAX / GLYCOLAX) packet Take 17 g by mouth daily. 03/20/18   Bary Leriche, PA-C    Family History Family History  Problem Relation Age of Onset  . Heart attack Mother   . Lung cancer Father   . Cancer Father   . Pancreatic cancer Sister   . Breast cancer Sister 64  . Throat cancer Brother   . Multiple myeloma Sister   . Breast cancer Sister        diagnosed in her 56's  . Heart attack Sister   . Stomach cancer Cousin   . Colon cancer Neg Hx     Social History Social History   Tobacco Use  . Smoking status: Never Smoker  . Smokeless tobacco: Never Used  Substance Use Topics  . Alcohol use: No  . Drug use: No     Allergies   Cephalosporins; Penicillins; Anesthetics, amide; Peach [prunus persica]; and Latex   Review of Systems Review of Systems  Constitutional: Negative for chills and fever.  Gastrointestinal:       No urinary or bowel incontinence  Genitourinary: Negative for difficulty urinating.  Musculoskeletal: Positive for back pain.  Skin: Negative for color change.  Neurological:       + paresthesias     Physical Exam Updated Vital Signs BP (!) 179/91 (BP Location: Right Arm)   Pulse 72   Resp 17   SpO2 100%   Physical Exam Vitals signs and  nursing note reviewed.  Constitutional:      Appearance: She is not ill-appearing.  HENT:     Head: Normocephalic and atraumatic.  Eyes:     Conjunctiva/sclera: Conjunctivae normal.  Cardiovascular:     Rate and Rhythm: Normal rate and regular rhythm.     Pulses: Normal pulses.  Pulmonary:     Effort: Pulmonary effort is normal.     Breath sounds: Normal breath sounds. No wheezing, rhonchi or rales.  Musculoskeletal:     Comments: No erythema, edema, drainage near injection site.  No C, T, L midline spinal tenderness.  Positive right lumbar paraspinal tenderness going into the buttock  and the right anterior thigh. No calf tenderness. Strength 4 out of 5 on right side compared to left.  Sensation intact throughout to dull and sharp touch.  2+ DP and PT pulses.  Positive straight leg raise on right.  Skin:    General: Skin is warm and dry.     Coloration: Skin is not jaundiced.  Neurological:     Mental Status: She is alert.      ED Treatments / Results  Labs (all labs ordered are listed, but only abnormal results are displayed) Labs Reviewed - No data to display  EKG None  Radiology No results found.  Procedures Procedures (including critical care time)  Medications Ordered in ED Medications  traMADol (ULTRAM) tablet 50 mg (50 mg Oral Given 08/01/19 1406)     Initial Impression / Assessment and Plan / ED Course  I have reviewed the triage vital signs and the nursing notes.  Pertinent labs & imaging results that were available during my care of the patient were reviewed by me and considered in my medical decision making (see chart for details).    58 year old female who presents the ED today complaining of continued paresthesias in her right lower extremity.  Patient had her lumbar decompression bilateral L4 and L5 in May 2019.  She reports continued issues with her back since then with new decreased sensation to her right anterior thigh x 4 weeks.  Was recently seen by  [name] and received an SI joint injection; believed to be radicular pain from L4 nerve root.  She states that she had improved pain but then it returned this weekend prompting her to come to the ED instead.  She states that they wanted to do another joint injection tomorrow but she cannot wait until then due to her pain.  I have evaluated patient's notes including the most recent note by [name].  It appears that he prescribed a steroid Dose pack for patient to take if no relief with the SI joint injection.  Patient states she has not picked this up.  She has no red flags today suggest cauda equina, epidural abscess.  Her vital signs are stable in the ED. She is afebrile without tachycardia or tachypnea. Do not feel she needs emergent MRI at this time.   I have called [facility] and I am awaiting a callback from [name] for further commendations at this time.  It appears that she has an appointment scheduled for tomorrow for an additional injection.  Pt requesting pain medication - she takes Tramadol 50 mg at home; she has not taken this today. Will give home dose in the ED.   Discussed case with [name] - she has a follow up appointment at his office tomorrow morning. He recommends she begin taking her Prednisone as previously advised and additional pain medication for pt. PDMP reviewed; #60 50 mg Tramadol picked up 09/23. Will prescribe percocet instead.   I have discussed this with patient when she became visibly upset. She reports she already has oxycodone at home which did not help her back pain previously so she is not interested in additional pain medication today. I have strongly encouraged her to pick up her prednisone and start taking it and to follow up with ortho as scheduled for tomorrow morning. Pt states "my pharmacy mails me the prescription because I do not like going and standing in line." I have encouraged that she have a family member pick up this medication so  she can begin  taking it today. I have even offered to send to another pharmacy closer to be picked up on the way home but pt declined. I then advised that she call her pharmacy to see when they are supposed to deliver it to her home. Pt does not seem interested in further recommendations and states "this was a waste of my time."  Pt discharged at this time.   This note was prepared using Dragon voice recognition software and may include unintentional dictation errors due to the inherent limitations of voice recognition software.       Final Clinical Impressions(s) / ED Diagnoses   Final diagnoses:  Lumbar back pain with radiculopathy affecting right lower extremity    ED Discharge Orders    None       Eustaquio Maize, PA-C 08/01/19 1444    Eustaquio Maize, PA-C 08/01/19 1511    Virgel Manifold, MD 08/01/19 1512

## 2019-08-01 NOTE — Discharge Instructions (Signed)
Please call your pharmacy in regards to the prednisone that was prescribed by [name] - it is recommended that you take this as prescribed Keep appointment scheduled tomorrow for further evaluation with Dr. Nelva Bush

## 2019-08-01 NOTE — ED Notes (Signed)
At time of discharge during discharge instructions given to the [name].  The [name]. Had something to say about each thing read to her with the discharge instructions given to her about seeing [name] and Following up with the appt. She had scheduled for in the morning .  [name]. Wanted PAC name and RN obliged.  [name]. Was offered every opportunity by PA C for pain meds to help if she needed them and [name]. Refused.  [name]. Was not satisfied with what she was offered by [facility].

## 2019-08-01 NOTE — ED Triage Notes (Signed)
Pt presents with c/o right leg pain . PT states she got cortisone shot and has had worse pain since the shot.

## 2019-08-02 ENCOUNTER — Other Ambulatory Visit (HOSPITAL_COMMUNITY): Payer: Self-pay | Admitting: Specialist

## 2019-08-02 ENCOUNTER — Ambulatory Visit (HOSPITAL_COMMUNITY)
Admission: RE | Admit: 2019-08-02 | Discharge: 2019-08-02 | Disposition: A | Discharge status: Home / Self Care | Payer: BC Managed Care – PPO | Source: Ambulatory Visit | Attending: Specialist | Admitting: Specialist

## 2019-08-02 DIAGNOSIS — R52 Pain, unspecified: Secondary | ICD-10-CM

## 2019-08-02 DIAGNOSIS — M545 Low back pain: Secondary | ICD-10-CM | POA: Diagnosis not present

## 2019-08-02 MED FILL — diazePAM 5 MG TABS: 5 | 1 days supply | Qty: 2 | Fill #0

## 2019-08-02 NOTE — Progress Notes (Signed)
RLE venous duplex       has been completed. Preliminary results can be found under CV proc through chart review. June Leap, BS, RDMS, RVT    Attempted call report to office number with no answer.

## 2019-08-03 DIAGNOSIS — M79651 Pain in right thigh: Secondary | ICD-10-CM | POA: Diagnosis not present

## 2019-08-03 MED FILL — diazePAM 5 MG TABS: 5 | 1 days supply | Qty: 2 | Fill #0

## 2019-08-09 ENCOUNTER — Ambulatory Visit (INDEPENDENT_AMBULATORY_CARE_PROVIDER_SITE_OTHER): Payer: BC Managed Care – PPO

## 2019-08-09 ENCOUNTER — Other Ambulatory Visit: Payer: Self-pay

## 2019-08-09 DIAGNOSIS — Z23 Encounter for immunization: Secondary | ICD-10-CM | POA: Diagnosis not present

## 2019-08-12 DIAGNOSIS — G894 Chronic pain syndrome: Secondary | ICD-10-CM | POA: Diagnosis not present

## 2019-08-17 ENCOUNTER — Other Ambulatory Visit: Payer: Self-pay

## 2019-08-17 ENCOUNTER — Ambulatory Visit: Payer: BC Managed Care – PPO | Attending: Specialist | Admitting: Physical Therapy

## 2019-08-17 DIAGNOSIS — R2681 Unsteadiness on feet: Secondary | ICD-10-CM | POA: Diagnosis not present

## 2019-08-17 DIAGNOSIS — M6281 Muscle weakness (generalized): Secondary | ICD-10-CM | POA: Diagnosis not present

## 2019-08-17 DIAGNOSIS — R2689 Other abnormalities of gait and mobility: Secondary | ICD-10-CM

## 2019-08-18 ENCOUNTER — Encounter: Payer: Self-pay | Admitting: Physical Therapy

## 2019-08-18 NOTE — Therapy (Signed)
[facility] [address] [place], [name], [address] Phone: [phone]   Fax:  [phone]  Physical Therapy Treatment  Patient Details  Name: [name] MRN: [id number] Date of Birth: 07/25/1961 Referring Provider (PT): Meredith Staggers, MD   Encounter Date: 08/17/2019  PT End of Session - 08/18/19 2036    Visit Number  13    Number of Visits  15    Date for PT Re-Evaluation  07/27/19    Authorization Type  BC[employer]    Authorization - Visit Number  12    Authorization - Number of Visits  30    PT Start Time  6203    PT Stop Time  1630    PT Time Calculation (min)  45 min    Equipment Utilized During Treatment  Other (comment)   pool noodle, aquatic cuff for buoyancy/resistance   Activity Tolerance  Patient tolerated treatment well    Behavior During Therapy  [facility] for tasks assessed/performed       Past Medical History:  Diagnosis Date  . Anxiety   . Back pain    related to spinal stenosis and disc problem, radiates down left buttocks to leg., weakness occ.  [name] Dyspnea   . Headache   . Hyperlipidemia   . Hypertension   . PONV (postoperative nausea and vomiting)   . Vaginal foreign object    "Uses Femring"    Past Surgical History:  Procedure Laterality Date  . ABDOMINAL HYSTERECTOMY    . CARDIAC CATHETERIZATION N/A 04/18/2015   Procedure: Left Heart Cath and Coronary Angiography;  Surgeon: [name], MD;  Location: [facility];  Service: Cardiovascular;  Laterality: N/A;  . FOOT SURGERY Bilateral    [place] "bunion,bone spur, tendon" (1) -6'16, (1)-10'16  . IR EPIDUROGRAPHY  07/21/2018  . LUMBAR LAMINECTOMY/DECOMPRESSION MICRODISCECTOMY Bilateral 12/28/2015   Procedure: MICRO LUMBAR DECOMPRESSION L4 - L5 BILATERALLY;  Surgeon: [name], MD;  Location: [facility];  Service: Orthopedics;  Laterality: Bilateral;  . LUMBAR LAMINECTOMY/DECOMPRESSION MICRODISCECTOMY Bilateral  03/04/2018   Procedure: Revision of Microlumbar Decompression Bilateral Lumbar Four-Five;  Surgeon: [name], MD;  Location: [facility];  Service: Orthopedics;  Laterality: Bilateral;  90 mins  . WISDOM TOOTH EXTRACTION    . WOUND EXPLORATION N/A 03/04/2018   Procedure: EXPLORATION OF LUMBAR DECOMPRESSION WOUND;  Surgeon: [name], MD;  Location: [facility];  Service: Orthopedics;  Laterality: N/A;    There were no vitals filed for this visit.  Subjective Assessment - 08/18/19 2032    Subjective  Pt presents for aquatic therapy at [facility] - pt states [name] said she did have nerve damage in her right leg; pt states she is still waiting to hear from insurance co. about spinal stimulator implant    Pertinent History  decompression and laminectomy on 12/28/15 at L4-L5  with redo decompression surgery on May 9,2019, post-op paraparesis with surgery to remove small hematoma; 07/12/18 fall at work (involved rolling chair) with hospital admission, transfer to [place] with d/c home 08/03/18; HTN, Bil foot surgery     Patient Stated Goals  improve walking    Currently in Pain?  Yes    Pain Score  8     Pain Location  Back    Pain Orientation  Right    Pain Descriptors / Indicators  Aching;Discomfort;Tingling    Pain Type  Neuropathic pain    Pain Onset  More than a month ago  Pain Frequency  Constant         Aquatic therapy at [facility] - pool temp 87.4 degrees   Patient seen for aquatic therapy today.  Treatment took place in water 3.5-4 feet deep depending upon activity.  Pt entered and  Exited the pool via step negotiation with use of bilateral hand rails with min to mod assist for safety.  Pt performed runner's stretch on each leg and heel cord stretch bil. LE's with feet on pool wall - 30 sec hold each stretch x 1 rep Pt performed amb. 38[place] pool 2 reps forward; sideways 2 reps with use of noodle for UE support with therapist holding noodle for stability   Pt performed standing hip flexion,  abduction and hip extension with knee extended -  10 reps each leg with UE support; ankle cuff used for increased resistance with the eccentric contraction Marching in place 10 reps each leg with UE support  Squats x 10 reps with bil. UE support on pool edge    Pt requires aquatic therapy for the unweighting provided by the buoyancy of the water and needs the viscosity for strengthening LE's and trunk musc.; buoyancy of water needed for spinal decompression for increased tolerance for movement with less pain experienced than with land exercises                           PT Short Term Goals - 08/18/19 2044      PT SHORT TERM GOAL #1   Title  Improve TUG score from  47.5 secs to </= 41 secs with use of RW to demo improved mobility and reduced fall risk.    Baseline  52.88 secs - 11-11-18; 47.5 secs with RW on 04-07-19    Time  4    Period  Weeks    Status  New    Target Date  05/07/19      PT SHORT TERM GOAL #2   Title  Pt will increase gait velocity from .71 ft/sec with RW to >/= 1.2 ft/sec with use of RW for incr. gait efficiency.    Baseline  21.66 secs = 1.51 ft/sec with RW - 11-11-18; 45.97 secs = .71 ft/sec with RW on 04-07-19 ; 1" 1 sec initial trial    Time  4    Period  Weeks    Status  New    Target Date  05/07/19      PT SHORT TERM GOAL #3   Title  Perform sit to stand with no UE support and stabilize independently (without bracing LE's against mat table) x 5 reps to demo improved LE strength.    Baseline  Pt braced legs on reps 1 and 2 of 5 reps on 04-07-19    Time  4    Period  Weeks    Status  New    Target Date  05/07/19      PT SHORT TERM GOAL #4   Title  Amb. 115' with Lofstrand crutches  with CGA on flat, even surface.    Baseline  pt using RW for assist. with amb.    Time  4    Period  Weeks    Status  New    Target Date  05/07/19      PT SHORT TERM GOAL #5   Title  Independent in updated HEP including aquatic exercise program.    Time   4    Period  Weeks    Status  New    Target Date  05/07/19        PT Long Term Goals - 08/18/19 2044      PT LONG TERM GOAL #1   Title  Improve TUG score to </= 35 secs with RW for reduced fall risk.    Baseline  52.88 secs with RW on 11-11-18; 44.7 secs with 1 Lofstrand crutch on 01-11-19; 47.5 secs    Time  8    Period  Weeks    Status  New      PT LONG TERM GOAL #2   Title  Patient will improve gait velocity to >/= 1.5 ft/sec with Rt AFO and RW, demonstrating lesser fall risk    Baseline  21.66 secs = 1.51 ft/sec with RW;  25.6 secs = 1.28 ft/sec with 1 Lofstrand crutch - 01-12-19; 45.97 secs = .71 ft/sec ft/sec with RW on 04-07-19    Time  8    Period  Weeks    Status  New      PT LONG TERM GOAL #3   Title  Negotiate 4 steps with 1 hand rail using a step by step sequence.    Baseline  step by step sequence with use of hand rail and with 1 Lofstrand crutch - 01-11-19    Time  8    Period  Weeks    Status  New      PT LONG TERM GOAL #4   Title  Modified independent household amb. with use of 1 Lofstrand crutch (with use of AFO on RLE).    Baseline  not met 01-12-19    Time  8    Period  Weeks    Status  New            Plan - 08/18/19 2040    Clinical Impression Statement  Pt continues to have RLE weakness and spasticity with c/o pain; pt reported significantly decreased pain in RLE in the water due to compressive force of the water    PT Frequency  1x / week    PT Duration  8 weeks    PT Next Visit Plan  cont aquatic therapy for back pain and for improved mobility    PT Home Exercise Plan  seated ab sets and ball squeezes; leg lengthener and leg presses , standing static balance without UE; added runner's stretch and heel cord stretch to HEP    Consulted and Agree with Plan of Care  Patient       Patient will benefit from skilled therapeutic intervention in order to improve the following deficits and impairments:  Pain, Postural dysfunction, Difficulty walking,  Decreased strength, Decreased activity tolerance, Abnormal gait, Increased muscle spasms, Decreased endurance, Decreased balance, Decreased coordination, Decreased knowledge of use of DME, Decreased mobility, Decreased safety awareness, Impaired sensation, Impaired tone  Visit Diagnosis: Other abnormalities of gait and mobility  Muscle weakness (generalized)  Unsteadiness on feet     Problem List Patient Active Problem List   Diagnosis Date Noted  . Lumbar spinal stenosis   . Radicular pain   . Gait disorder   . Difficulty in urination 07/13/2018  . Back pain 07/12/2018  . Hyperlipidemia 04/16/2018  . Generalized anxiety disorder with panic attacks 04/16/2018  . AKI (acute kidney injury) ([place])   . Benign essential HTN   . Depression with anxiety   . Lumbar radiculopathy 03/09/2018  . Paraparesis ([place]) 03/09/2018  . Essential hypertension   .  Chronic nonintractable headache   . Leukocytosis   . Acute blood loss anemia   . Postoperative pain   . Generalized weakness   . Spinal stenosis at L4-L5 level 03/04/2018  . Spinal stenosis of lumbar region 12/28/2015  . Chest pain 04/16/2015    Demetrice Combes, [place], PT, [place] 08/18/2019, 8:46 PM  [facility] [address] [place], [name], [address] Phone: [phone]   Fax:  [phone]  Name: GABRIAL POPPELL MRN: [id number] Date of Birth: [birth year]

## 2019-08-25 MED FILL — FEMRING 0.05 MG/24HR RING: 0.05 | 90 days supply | Qty: 1 | Fill #1

## 2019-08-26 DIAGNOSIS — G894 Chronic pain syndrome: Secondary | ICD-10-CM | POA: Diagnosis not present

## 2019-08-26 DIAGNOSIS — M5416 Radiculopathy, lumbar region: Secondary | ICD-10-CM | POA: Diagnosis not present

## 2019-09-02 DIAGNOSIS — G894 Chronic pain syndrome: Secondary | ICD-10-CM | POA: Diagnosis not present

## 2019-09-06 DIAGNOSIS — Z6827 Body mass index (BMI) 27.0-27.9, adult: Secondary | ICD-10-CM | POA: Diagnosis not present

## 2019-09-06 DIAGNOSIS — Z01419 Encounter for gynecological examination (general) (routine) without abnormal findings: Secondary | ICD-10-CM | POA: Diagnosis not present

## 2019-09-07 MED FILL — DIAZEPAM 10 MG TABS: 10 | 1 days supply | Qty: 2 | Fill #0

## 2019-09-14 DIAGNOSIS — Z01818 Encounter for other preprocedural examination: Secondary | ICD-10-CM | POA: Diagnosis not present

## 2019-09-20 ENCOUNTER — Ambulatory Visit: Payer: Self-pay | Admitting: Orthopedic Surgery

## 2019-09-20 ENCOUNTER — Telehealth: Payer: Self-pay | Admitting: *Deleted

## 2019-09-20 ENCOUNTER — Telehealth: Payer: Self-pay | Admitting: Family Medicine

## 2019-09-20 DIAGNOSIS — M961 Postlaminectomy syndrome, not elsewhere classified: Secondary | ICD-10-CM | POA: Diagnosis not present

## 2019-09-20 DIAGNOSIS — Z79891 Long term (current) use of opiate analgesic: Secondary | ICD-10-CM | POA: Diagnosis not present

## 2019-09-20 NOTE — H&P (Deleted)
  The note originally documented on this encounter has been moved the the encounter in which it belongs.  

## 2019-09-20 NOTE — Telephone Encounter (Signed)
Patient dropped off a pre-operative clearance form  Fax to: [phone]  ATTN: Judeen Hammans  Disposition: Dr's Folder

## 2019-09-20 NOTE — H&P (Signed)
Subjective:   For associated symptoms, patient reports weakness and numbness. For location, she reports right. For quality, she reports constant and worsening. For severity, she reports pain level 6-7/10. For duration, she reports 3 years and continuous since onset. For timing, she reports chronic. For alleviating factors, she reports narcotics. For aggravating factors, she reports standing and walking. For previous surgery, she reports surgical procedure:. For prior imaging, she reports mri. For previous injections, she reports did not help. For previous pt, she reports did not help. For work related, she reports no. For working, she reports no. Dr Nelva Bush referral for discussion/sched SCS Implant  Patient Active Problem List   Diagnosis Date Noted  . Lumbar spinal stenosis   . Radicular pain   . Gait disorder   . Difficulty in urination 07/13/2018  . Back pain 07/12/2018  . Hyperlipidemia 04/16/2018  . Generalized anxiety disorder with panic attacks 04/16/2018  . AKI (acute kidney injury) ([place])   . Benign essential HTN   . Depression with anxiety   . Lumbar radiculopathy 03/09/2018  . Paraparesis ([place]) 03/09/2018  . Essential hypertension   . Chronic nonintractable headache   . Leukocytosis   . Acute blood loss anemia   . Postoperative pain   . Generalized weakness   . Spinal stenosis at L4-L5 level 03/04/2018  . Spinal stenosis of lumbar region 12/28/2015  . Chest pain 04/16/2015   Past Medical History:  Diagnosis Date  . Anxiety   . Back pain    related to spinal stenosis and disc problem, radiates down left buttocks to leg., weakness occ.  [name] Dyspnea   . Headache   . Hyperlipidemia   . Hypertension   . PONV (postoperative nausea and vomiting)   . Vaginal foreign object    "Uses Femring"    Past Surgical History:  Procedure Laterality Date  . ABDOMINAL HYSTERECTOMY    . CARDIAC CATHETERIZATION N/A 04/18/2015   Procedure: Left Heart Cath and Coronary Angiography;   Surgeon: [name], MD;  Location: [facility];  Service: Cardiovascular;  Laterality: N/A;  . FOOT SURGERY Bilateral    [place] "bunion,bone spur, tendon" (1) -6'16, (1)-10'16  . IR EPIDUROGRAPHY  07/21/2018  . LUMBAR LAMINECTOMY/DECOMPRESSION MICRODISCECTOMY Bilateral 12/28/2015   Procedure: MICRO LUMBAR DECOMPRESSION L4 - L5 BILATERALLY;  Surgeon: [name], MD;  Location: [facility];  Service: Orthopedics;  Laterality: Bilateral;  . LUMBAR LAMINECTOMY/DECOMPRESSION MICRODISCECTOMY Bilateral 03/04/2018   Procedure: Revision of Microlumbar Decompression Bilateral Lumbar Four-Five;  Surgeon: [name], MD;  Location: [facility];  Service: Orthopedics;  Laterality: Bilateral;  90 mins  . WISDOM TOOTH EXTRACTION    . WOUND EXPLORATION N/A 03/04/2018   Procedure: EXPLORATION OF LUMBAR DECOMPRESSION WOUND;  Surgeon: [name], MD;  Location: [facility];  Service: Orthopedics;  Laterality: N/A;    Current Outpatient Medications  Medication Sig Dispense Refill Last Dose  . acetaminophen (TYLENOL) 500 MG tablet Take 1,000 mg by mouth every 8 (eight) hours as needed for moderate pain or headache.     . ALPRAZolam (XANAX) 0.25 MG tablet TAKE 1 TABLET BY MOUTH ONCE DAILY AS NEEDED FOR ANXIETY OR SLEEP (Patient taking differently: Take 0.25 mg by mouth daily as needed for anxiety or sleep. ) 30 tablet 1 07/29/2019  . atorvastatin (LIPITOR) 20 MG tablet Take 1 tablet (20 mg total) by mouth daily. 90 tablet 1 08/01/2019 at Unknown time  . carvedilol (COREG) 6.25 MG tablet Take 1 tablet (6.25 mg total) by mouth 2 (  two) times daily. 180 tablet 0 08/01/2019 at Unknown time  . diclofenac sodium (VOLTAREN) 1 % GEL Apply 2 g topically 3 (three) times daily. (Patient not taking: Reported on 09/20/2019) 1 Tube 1 Completed Course at Unknown time  . diphenhydrAMINE (BENADRYL) 25 MG tablet Take 25 mg by mouth daily as needed for allergies.     . Estradiol Acetate (FEMRING) 0.05 MG/24HR RING Place 1 each  vaginally every 3 (three) months.    05/25/2019  . gabapentin (NEURONTIN) 300 MG capsule Take 300 mg by mouth 4 (four) times daily.     [name] gabapentin (NEURONTIN) 600 MG tablet Take 1 tablet (600 mg total) by mouth 4 (four) times daily. (Patient not taking: Reported on 09/20/2019) 120 tablet 3 Not Taking at Unknown time  . methocarbamol (ROBAXIN) 500 MG tablet Take 500 mg by mouth 3 (three) times daily as needed for muscle spasms.     [name] neomycin-bacitracin-polymyxin (NEOSPORIN) ointment Apply 1 application topically as needed for wound care.     [name] oxyCODONE-acetaminophen (PERCOCET/ROXICET) 5-325 MG tablet Take 1 tablet by mouth 2 (two) times daily as needed for severe pain.     . polyethylene glycol (MIRALAX / GLYCOLAX) packet Take 17 g by mouth daily. (Patient taking differently: Take 17 g by mouth daily as needed for mild constipation. ) 30 each 0 Unknown at Unknown time  . traMADol (ULTRAM) 50 MG tablet TAKE 1 TABLET (50 MG TOTAL) BY MOUTH EVERY 12 HOURS. (Patient taking differently: Take 50 mg by mouth 3 (three) times daily as needed for moderate pain. ) 60 tablet 2 07/31/2019 at Unknown time   No current facility-administered medications for this visit.    Allergies  Allergen Reactions  . Cephalosporins Anaphylaxis  . Penicillins Anaphylaxis and Hives    Did it involve swelling of the face/tongue/throat, SOB, or low BP? Yes Did it involve sudden or severe rash/hives, skin peeling, or any reaction on the inside of your mouth or nose? Yes Did you need to seek medical attention at a hospital or doctor's office? Yes When did it last happen?10+ years If all above answers are "NO", may proceed with cephalosporin use.    . Anesthetics, Amide Nausea And Vomiting    Does not know name of it. States they put it on record foot center.   [name] Peach [Prunus Persica] Hives  . Latex Hives, Itching and Rash    Social History   Tobacco Use  . Smoking status: Never Smoker  . Smokeless tobacco: Never  Used  Substance Use Topics  . Alcohol use: No    Family History  Problem Relation Age of Onset  . Heart attack Mother   . Lung cancer Father   . Cancer Father   . Pancreatic cancer Sister   . Breast cancer Sister 74  . Throat cancer Brother   . Multiple myeloma Sister   . Breast cancer Sister        diagnosed in her 59's  . Heart attack Sister   . Stomach cancer Cousin   . Colon cancer Neg Hx     Review of Systems As stated in HPI  Objective:   Clinical exam: [name] is a pleasant individual, who appears younger than their stated age. She Is alert and orientated 3. No shortness of breath, chest pain. Abdomen is soft and non-tender, negative loss of bowel and bladder control, no rebound tenderness. Negative: skin lesions abrasions contusions  Lungs: Clear to auscultation bilaterally.  Cardiac: Regular rate and rhythm  no rubs gallops or murmurs  Peripheral pulses: 2+ dorsalis pedis/posterior tibialis pulses. Compartment soft and nontender.  Gait pattern: Abnormal gait pattern due to neurological deficit in the lower extremity  Assistive devices: AFO brace and walker due to chronic lower extremity weakness.  Neuro: Right lower extremity: 2/5 gastrocnemius strength. 0/5 EHL/tibialis anterior. 4+/5 hip flexor, quad strength. Left lower extremity: 5/5 motor strength. Positive diffuse numbness and dysesthesias in the right lower extremity. Negative straight leg raise test. Negative Babinski test. 1+ deep tendon reflexes at the knee and Achilles.  Musculoskeletal: Significant back pain radiating into the right lower extremity. No significant hip, knee, ankle pain with isolated joint range of motion significant stiffness and a equinus type deformity despite using an AFO on the right side.  X-rays of the lumbar spine are essentially unremarkable. No fracture, scoliosis, or spondylolisthesis.  MRI of the lumbar spine was reviewed (05/25/19). Postsurgical changes seen with an L4-5  decompression. There is overlying scar. No residual stenosis or recurrent disc herniation is seen. No significant changes from prior MRI of 07/13/18.  MRI of the thoracic spine demonstrates no significant scoliosis or cord compression. No contraindications for implantation of spinal cord stimulator.  Assessment:   She will is a very pleasant 57 year old with chronic back and radicular right leg pain with permanent neurological deficits in the foot drop. My initial evaluation was done in August 2020 and at that time he did not feel revision lumbar surgery would provide significant improvement in her quality-of-life and reduction in her pain. Is at that time I recommended a spinal cord stimulator trial. The patient underwent a spinal cord stimulator trial and had near complete relief of her pain. She did not require medications and she felt for the first time that her quality-of-life is improved. Since the trial has been removed her pain has returned and she presents to see me for permanent implantation.  Plan:    I have gone over the surgical procedure in great detail with her. I have explained the risks and benefits of surgery and she is agreed to move forward. Risks and benefits of surgery were discussed with the patient. These include: Infection, bleeding, death, stroke, paralysis, ongoing or worse pain, need for additional surgery, leak of spinal fluid, Failure of the battery requiring reoperation. Inability to place the paddle requiring the surgery to be aborted. Migration of the lead, failure to obtain results similar to the trial.  Treatment plan: We will plan on moving forward with surgery in the immediate future after clearance is obtained from her PCP.

## 2019-09-20 NOTE — Telephone Encounter (Signed)
Copied from [place] [id number]. Topic: General - Inquiry >> Sep 20, 2019  1:58 PM Percell Belt A wrote: Reason for CRM: pt called in and stated that Between pt surgery for 12/2. They are needing the Surgical clearance form fill out before the 2nd and faxed to the number that was on the paper that she dropped off at the office today.   Please advise

## 2019-09-21 NOTE — Telephone Encounter (Signed)
She needs pre op evaluation, which can be virtual if she does not feel comfortable coming to the office.  Thanks, BJ

## 2019-09-21 NOTE — Telephone Encounter (Signed)
This was already addressed in another tel encounter. She needs pre op evaluation. Thanks, BJ

## 2019-09-21 NOTE — Telephone Encounter (Signed)
Left a detailed message on verified voice mail asking patient to schedule an appointment for pre op.  CRM created.

## 2019-09-23 ENCOUNTER — Encounter (HOSPITAL_COMMUNITY): Payer: Self-pay

## 2019-09-23 ENCOUNTER — Other Ambulatory Visit: Payer: Self-pay

## 2019-09-23 ENCOUNTER — Encounter (HOSPITAL_COMMUNITY)
Admission: RE | Admit: 2019-09-23 | Discharge: 2019-09-23 | Disposition: A | Discharge status: Home / Self Care | Payer: BC Managed Care – PPO | Source: Ambulatory Visit | Attending: Orthopedic Surgery | Admitting: Orthopedic Surgery

## 2019-09-23 DIAGNOSIS — I10 Essential (primary) hypertension: Secondary | ICD-10-CM | POA: Diagnosis not present

## 2019-09-23 DIAGNOSIS — Z01818 Encounter for other preprocedural examination: Secondary | ICD-10-CM

## 2019-09-23 HISTORY — DX: Postlaminectomy syndrome, not elsewhere classified: M96.1

## 2019-09-23 LAB — CBC
HCT: 38.1 % (ref 36.0–46.0)
Hemoglobin: 12.5 g/dL (ref 12.0–15.0)
MCH: 30.1 pg (ref 26.0–34.0)
MCHC: 32.8 g/dL (ref 30.0–36.0)
MCV: 91.8 fL (ref 80.0–100.0)
Platelets: 283 10*3/uL (ref 150–400)
RBC: 4.15 MIL/uL (ref 3.87–5.11)
RDW: 13 % (ref 11.5–15.5)
WBC: 4.3 10*3/uL (ref 4.0–10.5)
nRBC: 0 % (ref 0.0–0.2)

## 2019-09-23 LAB — APTT: aPTT: 36 seconds (ref 24–36)

## 2019-09-23 LAB — BASIC METABOLIC PANEL
Anion gap: 7 (ref 5–15)
BUN: 10 mg/dL (ref 6–20)
CO2: 29 mmol/L (ref 22–32)
Calcium: 9 mg/dL (ref 8.9–10.3)
Chloride: 104 mmol/L (ref 98–111)
Creatinine, Ser: 1.03 mg/dL — ABNORMAL HIGH (ref 0.44–1.00)
GFR calc Af Amer: >60 mL/min (ref >60)
GFR calc non Af Amer: 60 mL/min — ABNORMAL LOW (ref >60)
Glucose, Bld: 88 mg/dL (ref 70–99)
Potassium: 4.1 mmol/L (ref 3.5–5.1)
Sodium: 140 mmol/L (ref 135–145)

## 2019-09-23 LAB — URINALYSIS, ROUTINE W REFLEX MICROSCOPIC
Bilirubin Urine: NEGATIVE
Glucose, UA: NEGATIVE mg/dL
Hgb urine dipstick: NEGATIVE
Ketones, ur: NEGATIVE mg/dL
Leukocytes,Ua: NEGATIVE
Nitrite: NEGATIVE
Protein, ur: NEGATIVE mg/dL
Specific Gravity, Urine: 1.023 (ref 1.005–1.030)
pH: 5 (ref 5.0–8.0)

## 2019-09-23 LAB — SURGICAL PCR SCREEN
MRSA, PCR: NEGATIVE
Staphylococcus aureus: NEGATIVE

## 2019-09-23 LAB — PROTIME-INR
INR: 1 (ref 0.8–1.2)
Prothrombin Time: 13.2 seconds (ref 11.4–15.2)

## 2019-09-23 NOTE — Progress Notes (Signed)
Pt denies SOB, chest pain, and being under the care of a cardiologist. Pt stated that Dr. Betty [name] is her PCP. Pt stated that she has an appointment on Monday, 09/26/19 with PCP for surgical clearance. Pt denies having a chest x ray and EKG in the last year. Pt denies recent labs. Pt verbalized understanding of all pre-op instructions.  Pt chart forwarded to PA, Anesthesiology,  for consult as ordered.

## 2019-09-26 ENCOUNTER — Encounter: Payer: Self-pay | Admitting: Family Medicine

## 2019-09-26 ENCOUNTER — Other Ambulatory Visit: Payer: Self-pay

## 2019-09-26 ENCOUNTER — Other Ambulatory Visit (HOSPITAL_COMMUNITY)
Admission: RE | Admit: 2019-09-26 | Discharge: 2019-09-26 | Disposition: A | Discharge status: Home / Self Care | Payer: BC Managed Care – PPO | Source: Ambulatory Visit | Attending: Orthopedic Surgery | Admitting: Orthopedic Surgery

## 2019-09-26 ENCOUNTER — Ambulatory Visit (INDEPENDENT_AMBULATORY_CARE_PROVIDER_SITE_OTHER): Payer: BC Managed Care – PPO | Admitting: Family Medicine

## 2019-09-26 VITALS — BP 124/70 | HR 85 | Temp 97.6°F | Resp 12 | Ht 65.0 in | Wt 178.6 lb

## 2019-09-26 DIAGNOSIS — Z20828 Contact with and (suspected) exposure to other viral communicable diseases: Secondary | ICD-10-CM | POA: Diagnosis not present

## 2019-09-26 DIAGNOSIS — M5416 Radiculopathy, lumbar region: Secondary | ICD-10-CM

## 2019-09-26 DIAGNOSIS — Z01818 Encounter for other preprocedural examination: Secondary | ICD-10-CM

## 2019-09-26 DIAGNOSIS — F411 Generalized anxiety disorder: Secondary | ICD-10-CM

## 2019-09-26 DIAGNOSIS — Z01812 Encounter for preprocedural laboratory examination: Secondary | ICD-10-CM | POA: Insufficient documentation

## 2019-09-26 DIAGNOSIS — F410 Panic disorder [episodic paroxysmal anxiety] without agoraphobia: Secondary | ICD-10-CM

## 2019-09-26 DIAGNOSIS — I10 Essential (primary) hypertension: Secondary | ICD-10-CM

## 2019-09-26 DIAGNOSIS — F418 Other specified anxiety disorders: Secondary | ICD-10-CM

## 2019-09-26 LAB — SARS CORONAVIRUS 2 (TAT 6-24 HRS): SARS Coronavirus 2: NEGATIVE

## 2019-09-26 NOTE — Patient Instructions (Signed)
A few things to remember from today's visit:   Essential hypertension  Lumbar radiculopathy  I will be pleased forms and fax it to your orthopedist. No changes in current management.  Please be sure medication list is accurate. If a new problem present, please set up appointment sooner than planned today.

## 2019-09-26 NOTE — Progress Notes (Signed)
HPI:   [name] is a 58 y.o. female, who is here today for 6 months follow up and preop clearance requested by Dr. Rolena Infante.  She is planning on having lumbar spinal cord stimulator insertion on 09/28/2019.  She denies any complication during prior surgeries or upon recovery from anesthesia. Having lower back pain associated with numbness of lower extremities and weakness, R>L S/P revision hemilaminectomies in 02/2018. Currently she is on gabapentin 600 mg 4 times per day, Percocet 5-325 mg twice daily as needed, and tramadol 50 mg 3 times daily as needed  Negative for history of DM 2, CKD,CHF, or tobacco use.  She already had preop labs,CXR, and EKG done on 09/23/2019.  She has limitations in regard to physical activity due to right dropfoot and back pain but denies chest pain, dyspnea, palpitation, and diaphoresis with exertion.  Hypertension: She is not checking BP at home. Currently she is on carvedilol 6.25 mg twice daily Denies unusual headache, visual changes, decreased urine output, or edema.  Positive Cardiolite lexican stress test in 03/2015 She underwent left cardiac cath on 04/18/19, negative for significant angiographic coronary artery disease.  Lab Results  Component Value Date   CREATININE 1.03 (H) 09/23/2019   BUN 10 09/23/2019   NA 140 09/23/2019   K 4.1 09/23/2019   CL 104 09/23/2019   CO2 29 09/23/2019   Hyperlipidemia: Currently she is on nonpharmacologic treatment. Lab Results  Component Value Date   CHOL 287 (H) 04/19/2018   HDL 76.40 04/19/2018   LDLCALC 196 (H) 04/19/2018   TRIG 73.0 04/19/2018   CHOLHDL 4 04/19/2018    Anxiety: She is on alprazolam 0.25 mg daily as needed, she does not take medication very frequent.  She tells me that she is applying for disability, she cannot stay sitting or standing up for long time because of lower back pain.    Review of Systems  Constitutional: Negative for activity change, appetite  change, fatigue and fever.  HENT: Negative for mouth sores, nosebleeds and trouble swallowing.   Eyes: Negative for redness and visual disturbance.  Respiratory: Negative for cough and wheezing.   Gastrointestinal: Negative for abdominal pain, nausea and vomiting.       Negative for changes in bowel habits.  Genitourinary: Negative for decreased urine volume, dysuria and hematuria.  Neurological: Negative for syncope, facial asymmetry and speech difficulty.  Psychiatric/Behavioral: Negative for confusion. The patient is nervous/anxious.   Rest of ROS, see pertinent positives sand negatives in HPI   Current Outpatient Medications on File Prior to Visit  Medication Sig Dispense Refill  . acetaminophen (TYLENOL) 500 MG tablet Take 1,000 mg by mouth every 8 (eight) hours as needed for moderate pain or headache.    . ALPRAZolam (XANAX) 0.25 MG tablet TAKE 1 TABLET BY MOUTH ONCE DAILY AS NEEDED FOR ANXIETY OR SLEEP (Patient taking differently: Take 0.25 mg by mouth daily as needed for anxiety or sleep. ) 30 tablet 1  . atorvastatin (LIPITOR) 20 MG tablet Take 1 tablet (20 mg total) by mouth daily. 90 tablet 1  . carvedilol (COREG) 6.25 MG tablet Take 1 tablet (6.25 mg total) by mouth 2 (two) times daily. 180 tablet 0  . diclofenac sodium (VOLTAREN) 1 % GEL Apply 2 g topically 3 (three) times daily. 1 Tube 1  . diphenhydrAMINE (BENADRYL) 25 MG tablet Take 25 mg by mouth daily as needed for allergies.    . Estradiol Acetate (FEMRING) 0.05 MG/24HR RING Place 1  each vaginally every 3 (three) months.     . gabapentin (NEURONTIN) 300 MG capsule Take 300 mg by mouth 4 (four) times daily.    [name] gabapentin (NEURONTIN) 600 MG tablet Take 1 tablet (600 mg total) by mouth 4 (four) times daily. 120 tablet 3  . methocarbamol (ROBAXIN) 500 MG tablet Take 500 mg by mouth 3 (three) times daily as needed for muscle spasms.    [name] neomycin-bacitracin-polymyxin (NEOSPORIN) ointment Apply 1 application topically as needed  for wound care.    [name] oxyCODONE-acetaminophen (PERCOCET/ROXICET) 5-325 MG tablet Take 1 tablet by mouth 2 (two) times daily as needed for severe pain.    . polyethylene glycol (MIRALAX / GLYCOLAX) packet Take 17 g by mouth daily. (Patient taking differently: Take 17 g by mouth daily as needed for mild constipation. ) 30 each 0  . traMADol (ULTRAM) 50 MG tablet TAKE 1 TABLET (50 MG TOTAL) BY MOUTH EVERY 12 HOURS. (Patient taking differently: Take 50 mg by mouth 3 (three) times daily as needed for moderate pain. ) 60 tablet 2   No current facility-administered medications on file prior to visit.      Past Medical History:  Diagnosis Date  . Anxiety   . Back pain    related to spinal stenosis and disc problem, radiates down left buttocks to leg., weakness occ.  [name] Dyspnea   . Headache   . Hyperlipidemia   . Hypertension   . Lumbar post-laminectomy syndrome   . PONV (postoperative nausea and vomiting)   . Vaginal foreign object    "Uses Femring"   Past Surgical History:  Procedure Laterality Date  . ABDOMINAL HYSTERECTOMY    . CARDIAC CATHETERIZATION N/A 04/18/2015   Procedure: Left Heart Cath and Coronary Angiography;  Surgeon: [name], MD;  Location: [facility];  Service: Cardiovascular;  Laterality: N/A;  . COLONOSCOPY W/ BIOPSIES AND POLYPECTOMY    . FOOT SURGERY Bilateral    [place] "bunion,bone spur, tendon" (1) -6'16, (1)-10'16  . IR EPIDUROGRAPHY  07/21/2018  . LUMBAR LAMINECTOMY/DECOMPRESSION MICRODISCECTOMY Bilateral 12/28/2015   Procedure: MICRO LUMBAR DECOMPRESSION L4 - L5 BILATERALLY;  Surgeon: [name], MD;  Location: [facility];  Service: Orthopedics;  Laterality: Bilateral;  . LUMBAR LAMINECTOMY/DECOMPRESSION MICRODISCECTOMY Bilateral 03/04/2018   Procedure: Revision of Microlumbar Decompression Bilateral Lumbar Four-Five;  Surgeon: [name], MD;  Location: [facility];  Service: Orthopedics;  Laterality: Bilateral;  90 mins  . TUBAL LIGATION    .  WISDOM TOOTH EXTRACTION    . WOUND EXPLORATION N/A 03/04/2018   Procedure: EXPLORATION OF LUMBAR DECOMPRESSION WOUND;  Surgeon: [name], MD;  Location: [facility];  Service: Orthopedics;  Laterality: N/A;    Allergies  Allergen Reactions  . Cephalosporins Anaphylaxis  . Penicillins Anaphylaxis and Hives    Did it involve swelling of the face/tongue/throat, SOB, or low BP? Yes Did it involve sudden or severe rash/hives, skin peeling, or any reaction on the inside of your mouth or nose? Yes Did you need to seek medical attention at a hospital or doctor's office? Yes When did it last happen?10+ years If all above answers are "NO", may proceed with cephalosporin use.    . Anesthetics, Amide Nausea And Vomiting    Does not know name of it. States they put it on record foot center.   [name] Peach [Prunus Persica] Hives  . Latex Hives, Itching and Rash    Social History   Socioeconomic History  . Marital status: Married  Spouse name: Not on file  . Number of children: Not on file  . Years of education: Not on file  . Highest education level: Not on file  Occupational History  . Not on file  Social Needs  . Financial resource strain: Not on file  . Food insecurity    Worry: Not on file    Inability: Not on file  . Transportation needs    Medical: Not on file    Non-medical: Not on file  Tobacco Use  . Smoking status: Never Smoker  . Smokeless tobacco: Never Used  Substance and Sexual Activity  . Alcohol use: No  . Drug use: No  . Sexual activity: Yes    Partners: Male    Birth control/protection: Post-menopausal  Lifestyle  . Physical activity    Days per week: Not on file    Minutes per session: Not on file  . Stress: Not on file  Relationships  . Social Herbalist on phone: Not on file    Gets together: Not on file    Attends religious service: Not on file    Active member of club or organization: Not on file    Attends meetings of clubs or  organizations: Not on file    Relationship status: Not on file  Other Topics Concern  . Not on file  Social History Narrative  . Not on file   Vitals:   09/26/19 0744  BP: 124/70  Pulse: 85  Resp: 12  Temp: 97.6 F (36.4 C)  SpO2: 94%   Body mass index is 29.72 kg/m.   Physical Exam  Nursing note and vitals reviewed. Constitutional: She is oriented to person, place, and time. She appears well-developed. No distress.  HENT:  Head: Normocephalic and atraumatic.  Mouth/Throat: Oropharynx is clear and moist and mucous membranes are normal.  Eyes: Pupils are equal, round, and reactive to light. Conjunctivae are normal.  Cardiovascular: Normal rate and regular rhythm.  No murmur heard. Pulses:      Dorsalis pedis pulses are 2+ on the right side and 2+ on the left side.  Respiratory: Effort normal and breath sounds normal. No respiratory distress.  GI: Soft. She exhibits no mass. There is no abdominal tenderness.  Musculoskeletal:        General: No edema.  Lymphadenopathy:    She has no cervical adenopathy.  Neurological: She is alert and oriented to person, place, and time. No cranial nerve deficit.  Except for right distal LE there is no focal deficit appreciated. Gait assisted by walker.  Skin: Skin is warm. No rash noted. No erythema.  Psychiatric: Her mood appears anxious.  Well groomed, good eye contact.   ASSESSMENT AND PLAN:   Ms. KORRA PANKOW was seen today for chronic disease management and surgical clearance.  1. Pre-op evaluation Chronic problems adequately controlled. Continue monitoring BP. DVT prophylaxis, ambulation when she is instructed to do so and compression stockings. She is hoping procedure helps with her chronic pain. She already had preop labs done. He has avoided NSAIDs and OTC supplements for at least 7 days. She can take BP medications and atorvastatin the day of the surgery. In general low risk for CV complication. Cleared for  surgery. Form completed and will be faxed.  2. Essential hypertension Be adequately controlled. Continue carvedilol 6.25 mg twice daily. Recommend monitoring BP regularly. Continue low-salt diet.  3. Lumbar radiculopathy Problem is not well controlled. She is hoping that spinal cord stimulator  helps with pain. Following with pain management.  4. Generalized anxiety disorder with panic attacks Continue Xanax 0.25 mg daily as needed. She denied tested in adding SSRI or SNRI.   Return in about 6 months (around 03/25/2020) for HTN,anxiety,HLD.   Orazio Weller G. [name], MD  [facility]. [place] office.

## 2019-09-27 ENCOUNTER — Telehealth: Payer: Self-pay | Admitting: *Deleted

## 2019-09-27 DIAGNOSIS — G894 Chronic pain syndrome: Secondary | ICD-10-CM | POA: Diagnosis not present

## 2019-09-27 NOTE — Progress Notes (Signed)
Anesthesia Chart Review:  Cardiac eval in 2016 for what was ultimately determined to be atypical chest pain. She had a false positive nuclear stress with subsequent cath showing no significant coronary disease. Pt was seen and cleared for surgery by her PCP Dr. Betty [name] on 09/26/19.  Preop labs reviewed, WNL.   EKG 09/23/19: NSR. Rate 72. Possible LAE.   Cath 04/18/15: No significant angiographic coronary disease. Hyperdynamic LV systolic function.    [name] [facility]/Anesthesiology Phone [phone] 09/27/2019 8:46 AM

## 2019-09-27 NOTE — Telephone Encounter (Signed)
Form has been faxed. Patient is aware. Nothing further needed.

## 2019-09-27 NOTE — Telephone Encounter (Signed)
Copied from [place] [phone]. Topic: Quick Communication - See Telephone Encounter >> Sep 27, 2019  8:32 AM Loma Boston wrote: CRM for notification. See Telephone encounter for: 09/27/19.Left a detailed message on verified voice mail asking patient to schedule an appointment for pre op.  CRM created. Pt saw Dr [name] Yesterday at 7:30 AM AND GOT HER CLEARANCE FROM DR J/ now Surgical has called her and states did not get the form and surgery is on hold. Pls fax [phone] Att Sherry Pls fax asap or surgery postponed.

## 2019-09-27 NOTE — Anesthesia Preprocedure Evaluation (Addendum)
Anesthesia Evaluation  Patient identified by MRN, date of birth, ID band Patient awake    Reviewed: Allergy & Precautions, NPO status   History of Anesthesia Complications (+) PONV  Airway Mallampati: II  TM Distance: >3 FB     Dental   Pulmonary    breath sounds clear to auscultation       Cardiovascular hypertension,  Rhythm:Regular Rate:Normal     Neuro/Psych    GI/Hepatic negative GI ROS,   Endo/Other    Renal/GU Renal disease     Musculoskeletal   Abdominal   Peds  Hematology  (+) anemia ,   Anesthesia Other Findings   Reproductive/Obstetrics                           Anesthesia Physical Anesthesia Plan  ASA: III  Anesthesia Plan: General   Post-op Pain Management:    Induction: Intravenous  PONV Risk Score and Plan:   Airway Management Planned: Oral ETT  Additional Equipment:   Intra-op Plan:   Post-operative Plan:   Informed Consent:     Dental advisory given  Plan Discussed with: CRNA and Anesthesiologist  Anesthesia Plan Comments: (Cardiac eval in 2016 for what was ultimately determined to be atypical chest pain. She had a false positive nuclear stress with subsequent cath showing no significant coronary disease. Pt was seen and cleared for surgery by her PCP Dr. Betty [name] on 09/26/19.  Preop labs reviewed, WNL.   EKG 09/23/19: NSR. Rate 72. Possible LAE.   Cath 04/18/15: No significant angiographic coronary disease. Hyperdynamic LV systolic function. )       Anesthesia Quick Evaluation

## 2019-09-28 ENCOUNTER — Observation Stay (HOSPITAL_COMMUNITY)
Admission: RE | Admit: 2019-09-28 | Discharge: 2019-09-30 | Disposition: A | Discharge status: Home / Self Care | Payer: BC Managed Care – PPO | Attending: Orthopedic Surgery | Admitting: Orthopedic Surgery

## 2019-09-28 ENCOUNTER — Other Ambulatory Visit: Payer: Self-pay

## 2019-09-28 ENCOUNTER — Ambulatory Visit (HOSPITAL_COMMUNITY): Payer: BC Managed Care – PPO

## 2019-09-28 ENCOUNTER — Encounter (HOSPITAL_COMMUNITY)
Admission: RE | Disposition: A | Discharge status: Home / Self Care | Payer: Self-pay | Source: Home / Self Care | Attending: Orthopedic Surgery

## 2019-09-28 ENCOUNTER — Ambulatory Visit (HOSPITAL_COMMUNITY): Payer: BC Managed Care – PPO | Admitting: Anesthesiology

## 2019-09-28 ENCOUNTER — Ambulatory Visit (HOSPITAL_COMMUNITY): Payer: BC Managed Care – PPO | Admitting: Physician Assistant

## 2019-09-28 ENCOUNTER — Encounter (HOSPITAL_COMMUNITY): Payer: Self-pay | Admitting: *Deleted

## 2019-09-28 DIAGNOSIS — F418 Other specified anxiety disorders: Secondary | ICD-10-CM | POA: Diagnosis not present

## 2019-09-28 DIAGNOSIS — Z79890 Hormone replacement therapy (postmenopausal): Secondary | ICD-10-CM | POA: Diagnosis not present

## 2019-09-28 DIAGNOSIS — Z880 Allergy status to penicillin: Secondary | ICD-10-CM | POA: Insufficient documentation

## 2019-09-28 DIAGNOSIS — Z884 Allergy status to anesthetic agent status: Secondary | ICD-10-CM | POA: Diagnosis not present

## 2019-09-28 DIAGNOSIS — Z79899 Other long term (current) drug therapy: Secondary | ICD-10-CM | POA: Insufficient documentation

## 2019-09-28 DIAGNOSIS — F419 Anxiety disorder, unspecified: Secondary | ICD-10-CM | POA: Diagnosis not present

## 2019-09-28 DIAGNOSIS — G894 Chronic pain syndrome: Secondary | ICD-10-CM | POA: Diagnosis not present

## 2019-09-28 DIAGNOSIS — Z419 Encounter for procedure for purposes other than remedying health state, unspecified: Secondary | ICD-10-CM

## 2019-09-28 DIAGNOSIS — G8929 Other chronic pain: Secondary | ICD-10-CM | POA: Diagnosis not present

## 2019-09-28 DIAGNOSIS — Z881 Allergy status to other antibiotic agents status: Secondary | ICD-10-CM | POA: Diagnosis not present

## 2019-09-28 DIAGNOSIS — M961 Postlaminectomy syndrome, not elsewhere classified: Principal | ICD-10-CM | POA: Insufficient documentation

## 2019-09-28 DIAGNOSIS — Z4542 Encounter for adjustment and management of neuropacemaker (brain) (peripheral nerve) (spinal cord): Secondary | ICD-10-CM | POA: Diagnosis not present

## 2019-09-28 DIAGNOSIS — I10 Essential (primary) hypertension: Secondary | ICD-10-CM | POA: Diagnosis not present

## 2019-09-28 DIAGNOSIS — E785 Hyperlipidemia, unspecified: Secondary | ICD-10-CM | POA: Diagnosis not present

## 2019-09-28 HISTORY — PX: SPINAL CORD STIMULATOR INSERTION: SHX5378

## 2019-09-28 SURGERY — INSERTION, SPINAL CORD STIMULATOR, LUMBAR
Anesthesia: General

## 2019-09-28 MED ORDER — HYDROMORPHONE HCL 1 MG/ML IJ SOLN
1.0000 mg | INTRAMUSCULAR | Status: AC | PRN
  Administered 2019-09-28 (×2): 1 mg via INTRAVENOUS
  Filled 2019-09-28 (×2): qty 1

## 2019-09-28 MED ORDER — ONDANSETRON HCL 4 MG PO TABS
4.0000 mg | ORAL_TABLET | Freq: Four times a day (QID) | ORAL | Status: DC | PRN
  Administered 2019-09-30: 4 mg via ORAL
  Filled 2019-09-28: qty 1

## 2019-09-28 MED ORDER — OXYCODONE HCL 5 MG PO TABS: 5.0000 mg | ORAL_TABLET | ORAL | Status: DC | PRN

## 2019-09-28 MED ORDER — ONDANSETRON HCL 4 MG PO TABS: 4.0000 mg | ORAL_TABLET | Freq: Three times a day (TID) | ORAL | 0 refills | Status: DC | PRN

## 2019-09-28 MED ORDER — LIDOCAINE 2% (20 MG/ML) 5 ML SYRINGE
INTRAMUSCULAR | Status: AC
  Filled 2019-09-28: qty 5

## 2019-09-28 MED ORDER — SUGAMMADEX SODIUM 200 MG/2ML IV SOLN
INTRAVENOUS | Status: DC | PRN
  Administered 2019-09-28: 200 mg via INTRAVENOUS

## 2019-09-28 MED ORDER — MENTHOL 3 MG MT LOZG: 1.0000 | LOZENGE | OROMUCOSAL | Status: DC | PRN

## 2019-09-28 MED ORDER — THROMBIN (RECOMBINANT) 20000 UNITS EX SOLR
CUTANEOUS | Status: AC
  Filled 2019-09-28: qty 20000

## 2019-09-28 MED ORDER — ROCURONIUM BROMIDE 50 MG/5ML IV SOSY
PREFILLED_SYRINGE | INTRAVENOUS | Status: DC | PRN
  Administered 2019-09-28: 70 mg via INTRAVENOUS

## 2019-09-28 MED ORDER — DEXAMETHASONE SODIUM PHOSPHATE 10 MG/ML IJ SOLN
INTRAMUSCULAR | Status: DC | PRN
  Administered 2019-09-28: 5 mg via INTRAVENOUS

## 2019-09-28 MED ORDER — FENTANYL CITRATE (PF) 100 MCG/2ML IJ SOLN
INTRAMUSCULAR | Status: DC | PRN
  Administered 2019-09-28: 50 ug via INTRAVENOUS
  Administered 2019-09-28: 100 ug via INTRAVENOUS
  Administered 2019-09-28 (×2): 50 ug via INTRAVENOUS

## 2019-09-28 MED ORDER — LACTATED RINGERS IV SOLN
INTRAVENOUS | Status: DC | PRN
  Administered 2019-09-28 (×3): via INTRAVENOUS

## 2019-09-28 MED ORDER — HYDROXYZINE HCL 25 MG PO TABS
50.0000 mg | ORAL_TABLET | Freq: Three times a day (TID) | ORAL | Status: DC | PRN
  Administered 2019-09-28 – 2019-09-30 (×4): 50 mg via ORAL
  Filled 2019-09-28 (×4): qty 2

## 2019-09-28 MED ORDER — PROPOFOL 10 MG/ML IV BOLUS
INTRAVENOUS | Status: DC | PRN
  Administered 2019-09-28: 170 mg via INTRAVENOUS

## 2019-09-28 MED ORDER — LIDOCAINE 2% (20 MG/ML) 5 ML SYRINGE
INTRAMUSCULAR | Status: DC | PRN
  Administered 2019-09-28: 80 mg via INTRAVENOUS

## 2019-09-28 MED ORDER — DEXAMETHASONE SODIUM PHOSPHATE 10 MG/ML IJ SOLN
INTRAMUSCULAR | Status: AC
  Filled 2019-09-28: qty 1

## 2019-09-28 MED ORDER — LACTATED RINGERS IV SOLN
Freq: Once | INTRAVENOUS | Status: AC
  Administered 2019-09-28: 09:00:00 via INTRAVENOUS

## 2019-09-28 MED ORDER — CARVEDILOL 6.25 MG PO TABS: 6.2500 mg | ORAL_TABLET | Freq: Two times a day (BID) | ORAL | Status: DC

## 2019-09-28 MED ORDER — FENTANYL CITRATE (PF) 250 MCG/5ML IJ SOLN
INTRAMUSCULAR | Status: AC
  Filled 2019-09-28: qty 5

## 2019-09-28 MED ORDER — SODIUM CHLORIDE 0.9% FLUSH: 3.0000 mL | Freq: Two times a day (BID) | INTRAVENOUS | Status: DC

## 2019-09-28 MED ORDER — BUPIVACAINE-EPINEPHRINE 0.5% -1:200000 IJ SOLN
INTRAMUSCULAR | Status: AC
  Filled 2019-09-28: qty 1

## 2019-09-28 MED ORDER — MIDAZOLAM HCL 2 MG/2ML IJ SOLN
INTRAMUSCULAR | Status: AC
  Filled 2019-09-28: qty 2

## 2019-09-28 MED ORDER — PROPOFOL 10 MG/ML IV BOLUS
INTRAVENOUS | Status: AC
  Filled 2019-09-28: qty 20

## 2019-09-28 MED ORDER — ROCURONIUM BROMIDE 10 MG/ML (PF) SYRINGE
PREFILLED_SYRINGE | INTRAVENOUS | Status: AC
  Filled 2019-09-28: qty 10

## 2019-09-28 MED ORDER — ACETAMINOPHEN 325 MG PO TABS: 650.0000 mg | ORAL_TABLET | ORAL | Status: DC | PRN

## 2019-09-28 MED ORDER — TRANEXAMIC ACID-NACL 1000-0.7 MG/100ML-% IV SOLN
1000.0000 mg | INTRAVENOUS | Status: AC
  Administered 2019-09-28: 1000 mg via INTRAVENOUS

## 2019-09-28 MED ORDER — POLYETHYLENE GLYCOL 3350 17 G PO PACK: 17.0000 g | PACK | Freq: Every day | ORAL | Status: DC | PRN

## 2019-09-28 MED ORDER — 0.9 % SODIUM CHLORIDE (POUR BTL) OPTIME
TOPICAL | Status: DC | PRN
  Administered 2019-09-28: 1000 mL

## 2019-09-28 MED ORDER — ACETAMINOPHEN 650 MG RE SUPP: 650.0000 mg | RECTAL | Status: DC | PRN

## 2019-09-28 MED ORDER — METHOCARBAMOL 1000 MG/10ML IJ SOLN
500.0000 mg | Freq: Four times a day (QID) | INTRAVENOUS | Status: DC | PRN
  Filled 2019-09-28: qty 5

## 2019-09-28 MED ORDER — OXYCODONE HCL 5 MG PO TABS
10.0000 mg | ORAL_TABLET | ORAL | Status: DC | PRN
  Administered 2019-09-28: 10 mg via ORAL
  Filled 2019-09-28: qty 2

## 2019-09-28 MED ORDER — METHOCARBAMOL 500 MG PO TABS
ORAL_TABLET | ORAL | Status: AC
  Administered 2019-09-28: 500 mg via ORAL
  Filled 2019-09-28: qty 1

## 2019-09-28 MED ORDER — OXYCODONE HCL 5 MG PO TABS
ORAL_TABLET | ORAL | Status: AC
  Administered 2019-09-28: 10 mg
  Filled 2019-09-28: qty 2

## 2019-09-28 MED ORDER — PHENOL 1.4 % MT LIQD: 1.0000 | OROMUCOSAL | Status: DC | PRN

## 2019-09-28 MED ORDER — HEMOSTATIC AGENTS (NO CHARGE) OPTIME
TOPICAL | Status: DC | PRN
  Administered 2019-09-28 (×2): 1 via TOPICAL

## 2019-09-28 MED ORDER — METHOCARBAMOL 500 MG PO TABS
500.0000 mg | ORAL_TABLET | Freq: Four times a day (QID) | ORAL | Status: DC | PRN
  Administered 2019-09-28 – 2019-09-30 (×5): 500 mg via ORAL
  Filled 2019-09-28 (×5): qty 1

## 2019-09-28 MED ORDER — VANCOMYCIN HCL IN DEXTROSE 1-5 GM/200ML-% IV SOLN
1000.0000 mg | Freq: Once | INTRAVENOUS | Status: AC
  Administered 2019-09-28: 1000 mg via INTRAVENOUS
  Filled 2019-09-28: qty 200

## 2019-09-28 MED ORDER — HYDROMORPHONE HCL 1 MG/ML IJ SOLN
INTRAMUSCULAR | Status: AC
  Administered 2019-09-28: 0.5 mg
  Filled 2019-09-28: qty 1

## 2019-09-28 MED ORDER — MIDAZOLAM HCL 5 MG/5ML IJ SOLN
INTRAMUSCULAR | Status: DC | PRN
  Administered 2019-09-28: 2 mg via INTRAVENOUS

## 2019-09-28 MED ORDER — ONDANSETRON HCL 4 MG/2ML IJ SOLN
INTRAMUSCULAR | Status: AC
  Filled 2019-09-28: qty 2

## 2019-09-28 MED ORDER — SODIUM CHLORIDE 0.9 % IV SOLN: 250.0000 mL | INTRAVENOUS | Status: DC

## 2019-09-28 MED ORDER — ONDANSETRON HCL 4 MG/2ML IJ SOLN
INTRAMUSCULAR | Status: DC | PRN
  Administered 2019-09-28: 4 mg via INTRAVENOUS

## 2019-09-28 MED ORDER — TRANEXAMIC ACID-NACL 1000-0.7 MG/100ML-% IV SOLN
INTRAVENOUS | Status: AC
  Filled 2019-09-28: qty 100

## 2019-09-28 MED ORDER — BUPIVACAINE-EPINEPHRINE 0.25% -1:200000 IJ SOLN
INTRAMUSCULAR | Status: DC | PRN
  Administered 2019-09-28: 10 mL

## 2019-09-28 MED ORDER — MAGNESIUM CITRATE PO SOLN
1.0000 | Freq: Once | ORAL | Status: AC | PRN
  Administered 2019-09-29: 1 via ORAL
  Filled 2019-09-28: qty 296

## 2019-09-28 MED ORDER — GABAPENTIN 300 MG PO CAPS
300.0000 mg | ORAL_CAPSULE | Freq: Four times a day (QID) | ORAL | Status: DC
  Administered 2019-09-28 – 2019-09-30 (×7): 300 mg via ORAL
  Filled 2019-09-28 (×7): qty 1

## 2019-09-28 MED ORDER — OXYCODONE-ACETAMINOPHEN 10-325 MG PO TABS: 1.0000 | ORAL_TABLET | Freq: Four times a day (QID) | ORAL | 0 refills | Status: DC | PRN

## 2019-09-28 MED ORDER — THROMBIN 20000 UNITS EX SOLR
CUTANEOUS | Status: DC | PRN
  Administered 2019-09-28: 20000 [IU] via TOPICAL

## 2019-09-28 MED ORDER — LACTATED RINGERS IV SOLN: INTRAVENOUS | Status: DC

## 2019-09-28 MED ORDER — ALPRAZOLAM 0.25 MG PO TABS
0.2500 mg | ORAL_TABLET | Freq: Every day | ORAL | Status: DC | PRN
  Filled 2019-09-28: qty 1

## 2019-09-28 MED ORDER — METHOCARBAMOL 500 MG PO TABS: 500.0000 mg | ORAL_TABLET | Freq: Three times a day (TID) | ORAL | 0 refills | Status: AC | PRN

## 2019-09-28 MED ORDER — SODIUM CHLORIDE 0.9% FLUSH: 3.0000 mL | INTRAVENOUS | Status: DC | PRN

## 2019-09-28 MED ORDER — VANCOMYCIN HCL IN DEXTROSE 1-5 GM/200ML-% IV SOLN: 1000.0000 mg | Freq: Once | INTRAVENOUS | Status: DC

## 2019-09-28 MED ORDER — ONDANSETRON HCL 4 MG/2ML IJ SOLN
4.0000 mg | Freq: Four times a day (QID) | INTRAMUSCULAR | Status: DC | PRN
  Administered 2019-09-28: 4 mg via INTRAVENOUS
  Filled 2019-09-28: qty 2

## 2019-09-28 MED ORDER — CARVEDILOL 6.25 MG PO TABS
6.2500 mg | ORAL_TABLET | Freq: Two times a day (BID) | ORAL | Status: DC
  Administered 2019-09-29 – 2019-09-30 (×2): 6.25 mg via ORAL
  Filled 2019-09-28 (×2): qty 1

## 2019-09-28 MED ORDER — VANCOMYCIN HCL IN DEXTROSE 1-5 GM/200ML-% IV SOLN
1000.0000 mg | INTRAVENOUS | Status: AC
  Administered 2019-09-28: 1000 mg via INTRAVENOUS
  Filled 2019-09-28: qty 200

## 2019-09-28 SURGICAL SUPPLY — 60 items
AGENT HMST KT MTR STRL THRMB (HEMOSTASIS) ×1
CANISTER SUCT 3000ML PPV (MISCELLANEOUS) ×2 IMPLANT
CLSR STERI-STRIP ANTIMIC 1/2X4 (GAUZE/BANDAGES/DRESSINGS) ×2 IMPLANT
COVER MAYO STAND STRL (DRAPES) ×2 IMPLANT
COVER PROBE W GEL 5X96 (DRAPES) IMPLANT
COVER SURGICAL LIGHT HANDLE (MISCELLANEOUS) ×2 IMPLANT
COVER WAND RF STERILE (DRAPES) ×2 IMPLANT
DRAPE C-ARM 42X72 X-RAY (DRAPES) ×2 IMPLANT
DRAPE INCISE IOBAN 85X60 (DRAPES) ×2 IMPLANT
DRAPE SURG 17X23 STRL (DRAPES) ×2 IMPLANT
DRAPE U-SHAPE 47X51 STRL (DRAPES) ×2 IMPLANT
DRSG OPSITE POSTOP 4X6 (GAUZE/BANDAGES/DRESSINGS) ×2 IMPLANT
DURAPREP 26ML APPLICATOR (WOUND CARE) ×2 IMPLANT
ELECT BLADE 4.0 EZ CLEAN MEGAD (MISCELLANEOUS)
ELECT CAUTERY BLADE 6.4 (BLADE) ×2 IMPLANT
ELECT PENCIL ROCKER SW 15FT (MISCELLANEOUS) ×2 IMPLANT
ELECT REM PT RETURN 9FT ADLT (ELECTROSURGICAL) ×2
ELECTRODE BLDE 4.0 EZ CLN MEGD (MISCELLANEOUS) IMPLANT
ELECTRODE REM PT RTRN 9FT ADLT (ELECTROSURGICAL) ×1 IMPLANT
GENERATOR PULSE PROCLAIM 5ELIT (Neuro Prosthesis/Implant) IMPLANT
GLOVE BIO SURGEON STRL SZ7 (GLOVE) ×2 IMPLANT
GLOVE BIOGEL PI IND STRL 7.0 (GLOVE) ×1 IMPLANT
GLOVE BIOGEL PI IND STRL 8.5 (GLOVE) ×1 IMPLANT
GLOVE BIOGEL PI INDICATOR 7.0 (GLOVE) ×1
GLOVE BIOGEL PI INDICATOR 8.5 (GLOVE) ×1
GOWN STRL REUS W/ TWL LRG LVL3 (GOWN DISPOSABLE) ×2 IMPLANT
GOWN STRL REUS W/TWL 2XL LVL3 (GOWN DISPOSABLE) ×2 IMPLANT
GOWN STRL REUS W/TWL LRG LVL3 (GOWN DISPOSABLE) ×4
KIT BASIN OR (CUSTOM PROCEDURE TRAY) ×2 IMPLANT
KIT TURNOVER KIT B (KITS) ×2 IMPLANT
LEAD LAMITRODE 60CM 8CH (Lead) ×2 IMPLANT
NDL SPNL 18GX3.5 QUINCKE PK (NEEDLE) ×1 IMPLANT
NEEDLE 22X1 1/2 (OR ONLY) (NEEDLE) ×2 IMPLANT
NEEDLE SPNL 18GX3.5 QUINCKE PK (NEEDLE) ×2 IMPLANT
NS IRRIG 1000ML POUR BTL (IV SOLUTION) ×2 IMPLANT
PACK LAMINECTOMY ORTHO (CUSTOM PROCEDURE TRAY) ×2 IMPLANT
PACK UNIVERSAL I (CUSTOM PROCEDURE TRAY) ×2 IMPLANT
PAD ARMBOARD 7.5X6 YLW CONV (MISCELLANEOUS) ×4 IMPLANT
PROGRAMMER DBS W/MAGNET NMRI (MISCELLANEOUS) ×1 IMPLANT
PULSE GENERATOR PROCLAIM 5ELIT (Neuro Prosthesis/Implant) ×2 IMPLANT
SPATULA SILICONE BRAIN 10MM (MISCELLANEOUS) IMPLANT
SPONGE LAP 4X18 RFD (DISPOSABLE) IMPLANT
SPONGE SURGIFOAM ABS GEL 100 (HEMOSTASIS) ×2 IMPLANT
STAPLER VISISTAT 35W (STAPLE) ×2 IMPLANT
SURGIFLO W/THROMBIN 8M KIT (HEMOSTASIS) ×2 IMPLANT
SUT BONE WAX W31G (SUTURE) ×2 IMPLANT
SUT ETHIBOND 2 OS 4 DA (SUTURE) ×2 IMPLANT
SUT MNCRL AB 3-0 PS2 18 (SUTURE) ×4 IMPLANT
SUT VIC AB 1 CT1 18XCR BRD 8 (SUTURE) ×2 IMPLANT
SUT VIC AB 1 CT1 8-18 (SUTURE) ×4
SUT VIC AB 2-0 CT1 18 (SUTURE) ×2 IMPLANT
SUT VIC AB 2-0 CT1 27 (SUTURE) ×2
SUT VIC AB 2-0 CT1 TAPERPNT 27 (SUTURE) IMPLANT
SYR BULB IRRIGATION 50ML (SYRINGE) ×2 IMPLANT
SYR CONTROL 10ML LL (SYRINGE) ×2 IMPLANT
TOOL TUNNELING 20 (MISCELLANEOUS) ×1 IMPLANT
TOWEL GREEN STERILE (TOWEL DISPOSABLE) ×2 IMPLANT
TOWEL GREEN STERILE FF (TOWEL DISPOSABLE) ×2 IMPLANT
WATER STERILE IRR 1000ML POUR (IV SOLUTION) ×2 IMPLANT
YANKAUER SUCT BULB TIP NO VENT (SUCTIONS) ×2 IMPLANT

## 2019-09-28 NOTE — Transfer of Care (Signed)
Immediate Anesthesia Transfer of Care Note  Patient: [name]  Procedure(s) Performed: THORACIC SPINAL CORD STIMULATOR INSERTION (N/A )  Patient Location: PACU  Anesthesia Type:General  Level of Consciousness: awake, alert , oriented and sedated  Airway & Oxygen Therapy: Patient Spontanous Breathing and Patient connected to face mask oxygen  Post-op Assessment: Report given to RN, Post -op Vital signs reviewed and stable and Patient moving all extremities  Post vital signs: Reviewed and stable  Last Vitals:  Vitals Value Taken Time  BP 144/87 09/28/19 1447  Temp 36.1 C 09/28/19 1445  Pulse 87 09/28/19 1449  Resp 21 09/28/19 1449  SpO2 100 % 09/28/19 1449  Vitals shown include unvalidated device data.  Last Pain:  Vitals:   09/28/19 0905  PainSc: 6       Patients Stated Pain Goal: 3 ([id number])  Complications: No apparent anesthesia complications

## 2019-09-28 NOTE — Brief Op Note (Signed)
09/28/2019  2:00 PM  PATIENT:  [name]  58 y.o. female  PRE-OPERATIVE DIAGNOSIS:  Post laminectomy syndrome  POST-OPERATIVE DIAGNOSIS:  Post laminectomy syndrome  PROCEDURE:  Procedure(s) with comments: THORACIC SPINAL CORD STIMULATOR INSERTION (N/A) - 2.5 hrs  SURGEON:  Surgeon(s) and Role:    Melina Schools, MD - Primary  PHYSICIAN ASSISTANT:   ASSISTANTS: Amanda Ward   ANESTHESIA:   general  EBL:  minimal   BLOOD ADMINISTERED:none  DRAINS: none   LOCAL MEDICATIONS USED:  MARCAINE     SPECIMEN:  No Specimen  DISPOSITION OF SPECIMEN:  N/A  COUNTS:  YES  TOURNIQUET:  * No tourniquets in log *  DICTATION: .Dragon Dictation  PLAN OF CARE: Admit for overnight observation  PATIENT DISPOSITION:  PACU - hemodynamically stable.

## 2019-09-28 NOTE — Discharge Instructions (Signed)
Surgical Spinal Decompression, Care After This sheet gives you information about how to care for yourself after your procedure. Your health care provider may also give you more specific instructions. If you have problems or questions, contact your health care provider. What can I expect after the procedure? After the procedure, it is common to have:  Some pain around your incision area.  Muscle tightening (spasms) across the back.   Follow these instructions at home: Incision care  Follow instructions from your health care provider about how to take care of your incision area. Make sure you: ? Wash your hands with soap and water before and after you apply medicine to the area or change your bandage (dressing). If soap and water are not available, use hand sanitizer. ? Change your dressing as told by your health care provider. ? Leave stitches (sutures), skin glue, or adhesive strips in place. These skin closures may need to stay in place for 2 weeks or longer. If adhesive strip edges start to loosen and curl up, you may trim the loose edges. Do not remove adhesive strips completely unless your health care provider tells you to do that.    Check your incision area every day for signs of infection. Check for: ? More redness, swelling, or pain. ? More fluid or blood. ? Warmth. ? Pus or a bad smell. Medicines  Take over-the-counter and prescription medicines only as told by your health care provider.  If you were prescribed an antibiotic medicine, use it as told by your health care provider. Do not stop using the antibiotic even if you start to feel better.  If needed, call office in 3 days to request refill of pain medications. Bathing  Do not take baths, swim, or use a hot tub for 6 weeks, or until your incision has healed completely.  If your health care provider approves, you may take showers after your dressing has been removed.  Ok to shower in 5 days Activity  Return to  your normal activities as told by your health care provider. Ask your health care provider what activities are safe for you.  Avoid bending or twisting at your waist. Always bend at your knees.  Do not sit for more than 20-30 minutes at a time. Lie down or walk between periods of sitting.  Do not lift anything that is heavier than 10 lb (4.5 kg) or the limit that your health care provider tells you, until he or she says that it is safe.  Do not drive for 2 weeks after your procedure or for as long as your health care provider tells you.    Do not drive or use heavy machinery while taking prescription pain medicine. General instructions  To prevent or treat constipation while you are taking prescription pain medicine, your health care provider may recommend that you: ? Drink enough fluid to keep your urine clear or pale yellow. ? Take over-the-counter or prescription medicines. ? Eat foods that are high in fiber, such as fresh fruits and vegetables, whole grains, and beans. ? Limit foods that are high in fat and processed sugars, such as fried and sweet foods.  Do breathing exercises as told.  Keep all follow-up visits as told by your health care provider. This is important. Contact a health care provider if:  You have more redness, swelling, or pain around your incision area.  Your incision feels warm to the touch.  You are not able to return to activities or do exercises  as told by your health care provider. Get help right away if:  You have: ? More fluid or blood coming from your incision area. ? Pus or a bad smell coming from your incision area. ? Chills or a fever. ? Episodes of dizziness or fainting while standing.  You develop a rash.  You develop shortness of breath or you have difficulty breathing.  You cannot control when you urinate or have a bowel movement.  You become weak.  You are not able to use your legs. Summary  After the procedure, it is common to  have some pain around your incision area. You may also have muscle tightening (spasms) across the back.  Follow instructions from your health care provider about how to care for your incision.  Do not lift anything that is heavier than 10 lb (4.5 kg) or the limit that your health care provider tells you, until he or she says that it is safe.  Contact your health care provider if you have more redness, swelling, or pain around your incision area or if your incision feels warm to the touch. These can be signs of infection. This information is not intended to replace advice given to you by your health care provider. Make sure you discuss any questions you have with your health care provider. Refer to this sheet in the next few weeks. These instructions provide you with information about caring for yourself after your procedure. Your health care provider may also give you more specific instructions. Your treatment has been planned according to current medical practices, but problems sometimes occur. Call your health care provider if you have any problems or questions after your procedure. What can I expect after the procedure? It is common to have pain for the first few days after the procedure. Some people continue to have mild pain even after making a full recovery. Follow these instructions at home: Medicine  Take medicines only as directed by your health care provider.  Avoid taking over-the-counter pain medicines unless your health care provider tells you otherwise. These medicines interfere with the development and growth of new bone cells.  If you were prescribed a narcotic pain medicine, take it exactly as told by your health care provider. ? Do not drink alcohol while on the medicine. ? Do not drive while on the medicine. Injury care  Care for your back brace as told by your health care provider.  If directed, apply ice to the injured area: ? Put ice in a plastic bag. ? Place a towel  between your skin and the bag. ? Leave the ice on for 20 minutes, 2-3 times a day. Activity  Perform physical therapy exercises as told by your health care provider.  Exercise regularly. Start by taking short walks. Slowly increase your activity level over time. Gentle exercise helps to ease pain.  Sit, stand, walk, turn in bed, and reposition yourself as told by your health care provider. This will help to keep your spine in proper alignment.  Avoid bending and twisting your body.  Avoid doing strenuous household chores, such as vacuuming.  Do not lift anything that is heavier than 10 lb (4.5 kg). Other Instructions  Keep all follow-up visits as directed by your health care provider. This is important.  Do not use any tobacco products, including cigarettes, chewing tobacco, or electronic cigarettes. If you need help quitting, ask your health care provider. Nicotine affects the way bones heal. Contact a health care provider if:  Your  pain gets worse.  You have a fever.  You have redness, swelling, or pain at the site of your incision.  You have fluid, blood, or pus coming from your incision.  You have numbness, tingling, or weakness in any part of your body. Get help right away if:  Your incision feels swollen and tender, and the surrounding area looks like a lump. The lump may be red or bluish in color.  You cannot move any part of your body (paralysis). You cannot control your bladder or bowels.  Your incision feels swollen and tender, and the surrounding area looks like a lump. The lump may be red or bluish in color.  You cannot move any part of your body (paralysis).  You cannot control your bladder or bowels. This information is not intended to replace advice given to you by your health care provider. Make sure you discuss any questions you have with your health care provider.

## 2019-09-28 NOTE — Anesthesia Postprocedure Evaluation (Signed)
Anesthesia Post Note  Patient: [name]  Procedure(s) Performed: THORACIC SPINAL CORD STIMULATOR INSERTION (N/A )     Patient location during evaluation: PACU Anesthesia Type: General Level of consciousness: awake Vital Signs Assessment: post-procedure vital signs reviewed and stable Respiratory status: spontaneous breathing Cardiovascular status: stable Postop Assessment: no apparent nausea or vomiting Anesthetic complications: no    Last Vitals:  Vitals:   09/28/19 0905 09/28/19 1445  BP: (!) 162/92   Pulse:    Resp:    Temp: 36.7 C (!) 36.1 C  SpO2:      Last Pain:  Vitals:   09/28/19 1510  PainSc: 10-Worst pain ever                 Charma Mocarski

## 2019-09-28 NOTE — Anesthesia Procedure Notes (Signed)
Procedure Name: Intubation Date/Time: 09/28/2019 11:45 AM Performed by: Leonor Liv, CRNA Pre-anesthesia Checklist: Patient identified, Emergency Drugs available, Suction available and Patient being monitored Patient Re-evaluated:Patient Re-evaluated prior to induction Oxygen Delivery Method: Circle System Utilized Preoxygenation: Pre-oxygenation with 100% oxygen Induction Type: IV induction Ventilation: Mask ventilation without difficulty Laryngoscope Size: Mac and 3 Grade View: Grade I Tube type: Oral Number of attempts: 2 Airway Equipment and Method: Stylet and Oral airway Placement Confirmation: ETT inserted through vocal cords under direct vision,  positive ETCO2 and breath sounds checked- equal and bilateral Secured at: 21 cm Tube secured with: Tape Dental Injury: Teeth and Oropharynx as per pre-operative assessment

## 2019-09-28 NOTE — Progress Notes (Signed)
Pharmacy Antibiotic Note  [name] is a 58 y.o. female with chronic back and radicular right leg pain with permanent neurological deficits in foot drip who was admitted on 09/28/2019 for surgery. Pt is S/P Thortcic laminotomy for implantation of permanent spinal cord stimulator and battery this afternoon .  Pharmacy has been consulted for vancomycin dosing for surgical prophylaxis.  Pt has history of penicillin allergy. She rec'd vancomycin 1 gm IV X 1 pre-op at 0903 today. Per surgeon note, pt has no drains in place post op.  WBC 4.3; Scr 1.03; CrCl 62.6 ml/min  Plan: Vancomycin 1 gm IV X 1 ~12 hrs after pre-op dose (give dose at 2100 tonight)  Height: 5\' 5"  (165.1 cm) Weight: 178 lb 9.2 oz (81 kg) IBW/kg (Calculated) : 57  Temp (24hrs), Avg:97.6 F (36.4 C), Min:97 F (36.1 C), Max:98.1 F (36.7 C)  Recent Labs  Lab 09/23/19 1207  WBC 4.3  CREATININE 1.03*    Estimated Creatinine Clearance: 62.6 mL/min (A) (by C-G formula based on SCr of 1.03 mg/dL (H)).    Allergies  Allergen Reactions  . Cephalosporins Anaphylaxis  . Penicillins Anaphylaxis and Hives    Did it involve swelling of the face/tongue/throat, SOB, or low BP? Yes Did it involve sudden or severe rash/hives, skin peeling, or any reaction on the inside of your mouth or nose? Yes Did you need to seek medical attention at a hospital or doctor's office? Yes When did it last happen?10+ years If all above answers are "NO", may proceed with cephalosporin use.    . Anesthetics, Amide Nausea And Vomiting    Does not know name of it. States they put it on record foot center.   [name] Peach [Prunus Persica] Hives  . Betadine [Povidone Iodine] Other (See Comments)    "Skin Burn" caused scar on Left buttock  . Latex Hives, Itching and Rash    Microbiology results: 11/30 COVID: negative 11/27 MRSA PCR: negative  Thank you for allowing pharmacy to be a part of this patient's care.  Gillermina Hu, PharmD,  BCPS, [facility] Clinical Pharmacist 09/28/2019 5:12 PM

## 2019-09-28 NOTE — Op Note (Signed)
Operative report  Preoperative diagnosis: Failed back syndrome with chronic pain  Postoperative diagnosis: Same  Operative procedure: Thoracic laminotomy for implantation of permanent spinal cord stimulator and battery.  First assistant: Cleta Alberts, PA  Complications: None  Implants: Abbott spine: S8 lamitrode S8 lead x2.  Proclaim XR 5 battery  Indications: She was a very pleasant woman who is had previous lumbar surgery and unfortunately has ongoing debilitating back and radicular leg pain right side worse than the left.  She recently underwent a spinal cord stimulator trial and had excellent relief of her pain and improved quality of life.  As a result of the successful trial she elected to move forward with the permanent implant.  All appropriate risks benefits and alternatives to surgery were discussed with the patient and consent was obtained.  Operative note.  Patient was brought the operating room placed upon the operating room table.  After successful induction of general anesthesia and endotracheal ovation teds SCDs were applied she was turned prone onto the Wilson frame.  All bony prominences were well-padded and the back was prepped and draped in a standard fashion.  Timeout was taken to confirm patient procedure and all other important data.  X-ray was used to identify the T10 spinous process I marked out an incision site and infiltrated with quarter percent Marcaine with epinephrine and then made my midline incision sharp dissection was carried out down to the deep fascia.  The fascia was sharply incised and I stripped the paraspinal muscles to expose the T10 and T11 spinous process and lamina.  Once this was done I used bipolar cautery to obtain hemostasis.  Using fluoroscopy identified the I was at the appropriate level and marked out the T10-11 interspace.  I used a double-action Leksell rongeur to remove the inferior third of the T10 spinous process.  I then developed a plane  underneath the lamina and used a 2 mm Kerrison punch to perform a laminotomy of T10.  I used a Penfield 4 to dissect through the central raphae of the ligamentum flavum until I was able to find a plane between the epidural fat and the ligamentum flavum.  Once I developed this plane I used my 1 and 2 mm Kerrison punch to resect the ligamentum flavum and exposed the dorsal epidural fat.  Once I had removed the dorsal epidural fat I had excellent visualization of the posterior aspect of the thecal sac.  I was able to easily pass my [facility] superiorly.  I attempted to pass the trial paddle but it was quite difficult.  As such I elected to use the lamitrode paddle.  The first paddle was placed along the right side and a second was placed more centrally.  I then confirmed with the representative from [employer] spine that the paddles are in the appropriate position and mimicked where she was getting maximum coverage during the trial.  With the leads properly positioned I then secured them to the T11 spinous process with an Ethibond suture.  Once secured I then made a second incision on the left gluteal region and dissected down to create a pocket approximately 3 cm deep I used the submuscular passer to advance the leads from the thoracic wound down to the battery site.  Both wounds were copiously irrigated and I obtained hemostasis using proper electrocautery.  I then disconnected the bipolar and the Bovie and then obtained the battery and secured the leads in place.  They were torqued and tightened according manufacture standards.  The battery was then placed in the pocket and the excess lead was placed behind it.  I then secured the battery directly to the deep fascia with two #1 Vicryl sutures.  At this point I then tested the battery and all leads were functioning properly.  At this point all wounds were copiously irrigated with normal saline and I used FloSeal to aid in hemostasis.  After final irrigation both  wounds were closed in a layered fashion with interrupted #1 Vicryl sutures for the deep fascia, 2-0 Vicryl sutures for superficial and 3-0 Monocryl for the skin.  Steri-Strips and dry dressing were applied.  Final x-rays demonstrated satisfactory position of the leads in both the AP and lateral planes.  Patient was ultimately extubated transfer the PACU without incident.  The end of the case all needle sponge counts were correct.  There were no adverse intraoperative events.

## 2019-09-28 NOTE — H&P (Signed)
Addendum H&P There is been no change in the patient's clinical exam from her last office visit of 09/20/2019.  Patient had a successful spinal cord stimulator trial and presents today for permanent implantation.  I have reviewed the risks benefits and alternatives with her and all of her questions were encouraged and addressed.  The patient has elected to move forward with surgery as we have planned.

## 2019-09-29 ENCOUNTER — Encounter (HOSPITAL_COMMUNITY): Payer: Self-pay | Admitting: Orthopedic Surgery

## 2019-09-29 DIAGNOSIS — F419 Anxiety disorder, unspecified: Secondary | ICD-10-CM | POA: Diagnosis not present

## 2019-09-29 DIAGNOSIS — E785 Hyperlipidemia, unspecified: Secondary | ICD-10-CM | POA: Diagnosis not present

## 2019-09-29 DIAGNOSIS — G8929 Other chronic pain: Secondary | ICD-10-CM | POA: Diagnosis not present

## 2019-09-29 DIAGNOSIS — Z79890 Hormone replacement therapy (postmenopausal): Secondary | ICD-10-CM | POA: Diagnosis not present

## 2019-09-29 DIAGNOSIS — M961 Postlaminectomy syndrome, not elsewhere classified: Secondary | ICD-10-CM | POA: Diagnosis not present

## 2019-09-29 DIAGNOSIS — Z884 Allergy status to anesthetic agent status: Secondary | ICD-10-CM | POA: Diagnosis not present

## 2019-09-29 DIAGNOSIS — Z880 Allergy status to penicillin: Secondary | ICD-10-CM | POA: Diagnosis not present

## 2019-09-29 DIAGNOSIS — Z79899 Other long term (current) drug therapy: Secondary | ICD-10-CM | POA: Diagnosis not present

## 2019-09-29 DIAGNOSIS — I10 Essential (primary) hypertension: Secondary | ICD-10-CM | POA: Diagnosis not present

## 2019-09-29 DIAGNOSIS — Z881 Allergy status to other antibiotic agents status: Secondary | ICD-10-CM | POA: Diagnosis not present

## 2019-09-29 MED ORDER — HYDROCODONE-ACETAMINOPHEN 5-325 MG PO TABS
1.0000 | ORAL_TABLET | ORAL | Status: DC | PRN
  Administered 2019-09-29 – 2019-09-30 (×8): 2 via ORAL
  Filled 2019-09-29 (×8): qty 2

## 2019-09-29 MED ORDER — HYDROCODONE-ACETAMINOPHEN 5-325 MG PO TABS: 1.0000 | ORAL_TABLET | Freq: Four times a day (QID) | ORAL | 0 refills | Status: DC | PRN

## 2019-09-29 MED FILL — Thrombin (Recombinant) For Soln 20000 Unit: CUTANEOUS | Qty: 1 | Status: AC

## 2019-09-29 NOTE — Evaluation (Signed)
Physical Therapy Evaluation Patient Details Name: [name] MRN: [id number] DOB: Jul 14, 1961 Today's Date: 09/29/2019   History of Present Illness  Pt is a 58 year old woman with chronic pain admitted for thoracic spinal cord stimulator implant. PMH: back [place], HTN, renal disease.  Clinical Impression  Pt admitted with above diagnosis. At the time of PT eval, pt was able to demonstrate transfers and ambulation with gross min guard assist with RW. Pt significantly limited by pain this session and declined attempting to ambulate out into the hallway. Pt was educated on precautions, brace application/wearing schedule, appropriate activity progression, and car transfer. Pt currently with functional limitations due to the deficits listed below (see PT Problem List). Pt will benefit from skilled PT to increase their independence and safety with mobility to allow discharge to the venue listed below.     Follow Up Recommendations No PT follow up;Supervision for mobility/OOB    Equipment Recommendations  None recommended by PT    Recommendations for Other Services       Precautions / Restrictions Precautions Precautions: Fall Precaution Booklet Issued: Yes (comment) Precaution Comments: instructed in back precautions for comfort Restrictions Weight Bearing Restrictions: No      Mobility  Bed Mobility Overal bed mobility: Needs Assistance Bed Mobility: Sidelying to Sit;Sit to Sidelying   Sidelying to sit: Supervision     Sit to sidelying: Supervision General bed mobility comments: HOB elevated with heavy use of railing for support.   Transfers Overall transfer level: Needs assistance Equipment used: Rolling walker (2 wheeled) Transfers: Sit to/from Stand Sit to Stand: Min guard         General transfer comment: Increased time to power-up to full standing position due to pain.   Ambulation/Gait Ambulation/Gait assistance: Min guard Gait Distance (Feet): 20  Feet Assistive device: Rolling walker (2 wheeled) Gait Pattern/deviations: Decreased stride length;Shuffle;Trunk flexed Gait velocity: Decreased Gait velocity interpretation: <1.31 ft/sec, indicative of household ambulator General Gait Details: Slow shuffle to/from bathroom in room only. Pt with closed eyes and swaying back and forth due to pain at times.   Stairs            Wheelchair Mobility    Modified Rankin (Stroke Patients Only)       Balance Overall balance assessment: Needs assistance Sitting-balance support: Feet supported;Single extremity supported Sitting balance-Leahy Scale: Fair     Standing balance support: Bilateral upper extremity supported Standing balance-Leahy Scale: Poor Standing balance comment: Pt leaning on sink for support while washing hands.                              Pertinent Vitals/Pain Pain Assessment: Faces Faces Pain Scale: Hurts whole lot Pain Location: incisions Pain Descriptors / Indicators: Aching;Grimacing;Guarding Pain Intervention(s): Limited activity within patient's tolerance;Monitored during session;Repositioned    Home Living Family/patient expects to be discharged to:: Private residence Living Arrangements: Spouse/significant other Available Help at Discharge: Family;Available 24 hours/day(sister is coming from [facility] tomorrow) Type of Home: House Home Access: Stairs to enter Entrance Stairs-Rails: Right Entrance Stairs-Number of Steps: 4 Home Layout: One level Home Equipment: Walker - 2 wheels;Cane - single point;Bedside commode;Shower seat;Adaptive equipment;Walker - 4 wheels      Prior Function Level of Independence: Independent with assistive device(s)         Comments: Typically uses rollator due to R foot drop     Hand Dominance   Dominant Hand: Right    Extremity/Trunk Assessment  Upper Extremity Assessment Upper Extremity Assessment: Overall WFL for tasks assessed    Lower Extremity  Assessment Lower Extremity Assessment: Generalized weakness    Cervical / Trunk Assessment Cervical / Trunk Assessment: Other exceptions Cervical / Trunk Exceptions: s/p surgery  Communication   Communication: No difficulties  Cognition Arousal/Alertness: Awake/alert Behavior During Therapy: Anxious Overall Cognitive Status: Within Functional Limits for tasks assessed                                        General Comments      Exercises     Assessment/Plan    PT Assessment Patient needs continued PT services  PT Problem List Decreased strength;Decreased activity tolerance;Decreased balance;Decreased mobility;Decreased knowledge of use of DME;Decreased safety awareness;Decreased knowledge of precautions;Pain       PT Treatment Interventions DME instruction;Gait training;Stair training;Functional mobility training;Therapeutic activities;Therapeutic exercise;Neuromuscular re-education;Patient/family education    PT Goals (Current goals can be found in the Care Plan section)  Acute Rehab PT Goals Patient Stated Goal: pain relief PT Goal Formulation: With patient Time For Goal Achievement: 10/06/19 Potential to Achieve Goals: Good    Frequency Min 5X/week   Barriers to discharge        Co-evaluation               AM-PAC PT "6 Clicks" Mobility  Outcome Measure Help needed turning from your back to your side while in a flat bed without using bedrails?: None Help needed moving from lying on your back to sitting on the side of a flat bed without using bedrails?: A Little Help needed moving to and from a bed to a chair (including a wheelchair)?: A Little Help needed standing up from a chair using your arms (e.g., wheelchair or bedside chair)?: A Little Help needed to walk in hospital room?: A Little Help needed climbing 3-5 steps with a railing? : A Lot 6 Click Score: 18    End of Session Equipment Utilized During Treatment: Gait belt Activity  Tolerance: Patient tolerated treatment well Patient left: in bed;with call bell/phone within reach Nurse Communication: Mobility status PT Visit Diagnosis: Unsteadiness on feet (R26.81);Pain Pain - part of body: (back)    Time: [id number] PT Time Calculation (min) (ACUTE ONLY): 19 min   Charges:   PT Evaluation $PT Eval Moderate Complexity: 1 Mod          Rolinda Roan, PT, DPT Acute Rehabilitation Services Pager: [phone] Office: [phone]   Thelma Comp 09/29/2019, 2:26 PM

## 2019-09-29 NOTE — Progress Notes (Signed)
    Subjective: Procedure(s) (LRB): THORACIC SPINAL CORD STIMULATOR INSERTION (N/A) 1 Day Post-Op  Patient reports pain as 7 on 0-10 scale.  Reports decreased leg pain reports incisional back pain   Positive void Negative bowel movement Positive flatus Negative chest pain or shortness of breath  Objective: Vital signs in last 24 hours: Temp:  [97 F (36.1 C)-98.4 F (36.9 C)] 97.7 F (36.5 C) (12/03 0740) Pulse Rate:  [67-85] 72 (12/03 0740) Resp:  [16-20] 16 (12/03 0740) BP: (128-185)/(65-95) 131/65 (12/03 0740) SpO2:  [98 %-100 %] 99 % (12/03 0740) Weight:  [81 kg] 81 kg (12/02 0851)  Intake/Output from previous day: 12/02 0701 - 12/03 0700 In: 500 [I.V.:500] Out: 15 [Blood:15]  Labs: No results for input(s): WBC, RBC, HCT, PLT in the last 72 hours. No results for input(s): NA, K, CL, CO2, BUN, CREATININE, GLUCOSE, CALCIUM in the last 72 hours. No results for input(s): LABPT, INR in the last 72 hours.  Physical Exam: Neurologically intact ABD soft Intact pulses distally Incision: dressing C/D/I and no drainage Compartment soft Body mass index is 29.72 kg/m.   Assessment/Plan: Patient stable  xrays n/a Continue mobilization with physical therapy Continue care  Advance diet Up with therapy  Patient reports her primary source of pain is the thoracic incision site. Patient remains neurologically intact with no focal deficits.  We will continue pain medical management.  Plan on discharge either either later today or in the morning.   Melina Schools, MD [facility] [phone]

## 2019-09-29 NOTE — Evaluation (Signed)
Occupational Therapy Evaluation and Discharge Patient Details Name: [name] MRN: [id number] DOB: 02/27/1961 Today's Date: 09/29/2019    History of Present Illness Pt is a 58 year old woman with chronic pain admitted for thoracic spinal cord stimulator implant. PMH: back [place], HTN, renal disease.   Clinical Impression   Pt was functioning modified independently in ADL prior to admission. Pt presents with increased pain with impaired standing balance. She has all necessary equipment at home and will have her sister available to assist her starting tomorrow. Reinforced back precautions for comfort with pt verbalizing understanding. No further OT needs.    Follow Up Recommendations  No OT follow up    Equipment Recommendations  None recommended by OT    Recommendations for Other Services       Precautions / Restrictions Precautions Precautions: Fall Precaution Booklet Issued: Yes (comment) Precaution Comments: instructed in back precautions for comfort Restrictions Weight Bearing Restrictions: No      Mobility Bed Mobility Overal bed mobility: Needs Assistance Bed Mobility: Sidelying to Sit;Sit to Sidelying   Sidelying to sit: Supervision     Sit to sidelying: Supervision General bed mobility comments: slow, use of rail  Transfers Overall transfer level: Needs assistance Equipment used: Rolling walker (2 wheeled) Transfers: Sit to/from Stand Sit to Stand: Min guard         General transfer comment: slow to rise    Balance Overall balance assessment: Needs assistance   Sitting balance-Leahy Scale: Fair       Standing balance-Leahy Scale: Fair                             ADL either performed or assessed with clinical judgement   ADL Overall ADL's : Needs assistance/impaired Eating/Feeding: Independent   Grooming: Wash/dry hands;Standing;Supervision/safety Grooming Details (indicate cue type and reason): cues for body mechanics, pt  educated in 2 cup method for tooth brushing and use of wash cloth on face Upper Body Bathing: Set up;Sitting Upper Body Bathing Details (indicate cue type and reason): recommended use of long handled bath sponge Lower Body Bathing: Minimal assistance;Sit to/from stand Lower Body Bathing Details (indicate cue type and reason): recommended use of long handled bath sponge and reacher Upper Body Dressing : Set up;Sitting   Lower Body Dressing: Minimal assistance;Sit to/from stand Lower Body Dressing Details (indicate cue type and reason): pt can use her reacher to don her socks and pants Toilet Transfer: Min guard;Ambulation;RW;Grab bars   Toileting- Clothing Manipulation and Hygiene: Modified independent;Sit to/from stand       Functional mobility during ADLs: Min guard;Rolling walker       Vision Patient Visual Report: No change from baseline       Perception     Praxis      Pertinent Vitals/Pain Pain Assessment: Faces Faces Pain Scale: Hurts whole lot Pain Location: incisions Pain Descriptors / Indicators: Aching;Grimacing;Guarding Pain Intervention(s): Monitored during session;Premedicated before session;Repositioned;Ice applied     Hand Dominance Right   Extremity/Trunk Assessment Upper Extremity Assessment Upper Extremity Assessment: Overall WFL for tasks assessed   Lower Extremity Assessment Lower Extremity Assessment: Defer to PT evaluation       Communication Communication Communication: No difficulties   Cognition Arousal/Alertness: Awake/alert Behavior During Therapy: Anxious Overall Cognitive Status: Within Functional Limits for tasks assessed  General Comments       Exercises     Shoulder Instructions      Home Living Family/patient expects to be discharged to:: Private residence Living Arrangements: Spouse/significant other Available Help at Discharge: Family;Available 24 hours/day(sister is  coming from [facility] tomorrow) Type of Home: House Home Access: Stairs to enter [employer] of Steps: 4 Entrance Stairs-Rails: Right Home Layout: One level     Bathroom Shower/Tub: Occupational psychologist: Standard     Home Equipment: Environmental consultant - 2 wheels;Cane - single point;Bedside commode;Shower seat;Adaptive equipment Adaptive Equipment: Reacher;Long-handled sponge        Prior Functioning/Environment Level of Independence: Independent with assistive device(s)                 OT Problem List:        OT Treatment/Interventions:      OT Goals(Current goals can be found in the care plan section) Acute Rehab OT Goals Patient Stated Goal: pain relief  OT Frequency:     Barriers to D/C:            Co-evaluation              AM-PAC OT "6 Clicks" Daily Activity     Outcome Measure Help from another person eating meals?: None Help from another person taking care of personal grooming?: A Little Help from another person toileting, which includes using toliet, bedpan, or urinal?: A Little Help from another person bathing (including washing, rinsing, drying)?: A Little Help from another person to put on and taking off regular upper body clothing?: None Help from another person to put on and taking off regular lower body clothing?: A Little 6 Click Score: 20   End of Session Equipment Utilized During Treatment: Rolling walker;Gait belt  Activity Tolerance: Patient tolerated treatment well Patient left: in bed;with call bell/phone within reach  OT Visit Diagnosis: Other abnormalities of gait and mobility (R26.89);Pain                Time: 0936-1010 OT Time Calculation (min): 34 min Charges:  OT General Charges $OT Visit: 1 Visit OT Evaluation $OT Eval Moderate Complexity: 1 Mod OT Treatments $Self Care/Home Management : 8-22 mins  Nestor Lewandowsky, OTR/L Acute Rehabilitation Services Pager: [phone] Office: [phone]  Malka So 09/29/2019, 10:34 AM

## 2019-09-30 DIAGNOSIS — E785 Hyperlipidemia, unspecified: Secondary | ICD-10-CM | POA: Diagnosis not present

## 2019-09-30 DIAGNOSIS — G8929 Other chronic pain: Secondary | ICD-10-CM | POA: Diagnosis not present

## 2019-09-30 DIAGNOSIS — I10 Essential (primary) hypertension: Secondary | ICD-10-CM | POA: Diagnosis not present

## 2019-09-30 DIAGNOSIS — F419 Anxiety disorder, unspecified: Secondary | ICD-10-CM | POA: Diagnosis not present

## 2019-09-30 DIAGNOSIS — Z884 Allergy status to anesthetic agent status: Secondary | ICD-10-CM | POA: Diagnosis not present

## 2019-09-30 DIAGNOSIS — Z79890 Hormone replacement therapy (postmenopausal): Secondary | ICD-10-CM | POA: Diagnosis not present

## 2019-09-30 DIAGNOSIS — Z79899 Other long term (current) drug therapy: Secondary | ICD-10-CM | POA: Diagnosis not present

## 2019-09-30 DIAGNOSIS — Z880 Allergy status to penicillin: Secondary | ICD-10-CM | POA: Diagnosis not present

## 2019-09-30 DIAGNOSIS — M961 Postlaminectomy syndrome, not elsewhere classified: Secondary | ICD-10-CM | POA: Diagnosis not present

## 2019-09-30 DIAGNOSIS — Z881 Allergy status to other antibiotic agents status: Secondary | ICD-10-CM | POA: Diagnosis not present

## 2019-09-30 MED ORDER — BUPIVACAINE HCL (PF) 0.25 % IJ SOLN
10.0000 mL | Freq: Once | INTRAMUSCULAR | Status: AC
  Administered 2019-09-30: 10 mL
  Filled 2019-09-30: qty 10

## 2019-09-30 MED FILL — ONDANSETRON HCL 4 MG TABLET: 4 | 7 days supply | Qty: 20 | Fill #0

## 2019-09-30 MED FILL — METHOCARBAMOL 500 MG TABS: 500 | 5 days supply | Qty: 15 | Fill #0

## 2019-09-30 MED FILL — HYDROCODON-APAP 5-325: 5-325 | 4 days supply | Qty: 30 | Fill #0

## 2019-09-30 NOTE — Plan of Care (Signed)
Patient alert and oriented, Mae's well, voiding adequate amount of urine, swallowing without difficulty, c/o moderate pain and meds given prior to discharged for ride and discomfort. Patient discharged home with family. Script and discharged instructions given to patient. Patient and family stated understanding of instructions given. Patient has an appointment with Dr.  Rolena Infante

## 2019-09-30 NOTE — Progress Notes (Signed)
Physical Therapy Treatment Patient Details Name: [name] MRN: [id number] DOB: 12-09-1960 Today's Date: 09/30/2019    History of Present Illness Pt is a 58 year old woman with chronic pain admitted for thoracic spinal cord stimulator implant. PMH: back [place], HTN, renal disease.    PT Comments    Pt progressing slowly towards physical therapy goals. Remains limited by pain. Increased ambulation distance to 55 feet with use of walker and min guard assist. Reinforced spinal precautions for comfort, activity progression, and level of supervision/assist needed for home.    Follow Up Recommendations  No PT follow up;Supervision for mobility/OOB     Equipment Recommendations  None recommended by PT    Recommendations for Other Services       Precautions / Restrictions Precautions Precautions: Fall Precaution Booklet Issued: Yes (comment) Precaution Comments: instructed in back precautions for comfort Required Braces or Orthoses: Other Brace Other Brace: R AFO Restrictions Weight Bearing Restrictions: No    Mobility  Bed Mobility Overal bed mobility: Needs Assistance Bed Mobility: Sidelying to Sit;Sit to Sidelying   Sidelying to sit: Supervision     Sit to sidelying: Supervision General bed mobility comments: HOB elevated with heavy use of railing for support.   Transfers Overall transfer level: Needs assistance Equipment used: Rolling walker (2 wheeled) Transfers: Sit to/from Stand Sit to Stand: Min guard         General transfer comment: Increased time to power-up to full standing position due to pain.   Ambulation/Gait Ambulation/Gait assistance: Min guard Gait Distance (Feet): 55 Feet Assistive device: Rolling walker (2 wheeled) Gait Pattern/deviations: Decreased stride length;Shuffle;Trunk flexed;Decreased dorsiflexion - right;Decreased dorsiflexion - left Gait velocity: Decreased Gait velocity interpretation: <1.8 ft/sec, indicate of risk for  recurrent falls General Gait Details: Cues for upright posture, scapular depression and keeping eyes open. Noted baseline gait deficits including RLE circumduction due to foot drop (AFO donned). also noted decreased left foot clearance at times   Stairs             Wheelchair Mobility    Modified Rankin (Stroke Patients Only)       Balance Overall balance assessment: Needs assistance Sitting-balance support: Feet supported;Single extremity supported Sitting balance-Leahy Scale: Fair     Standing balance support: Bilateral upper extremity supported Standing balance-Leahy Scale: Poor                              Cognition Arousal/Alertness: Awake/alert Behavior During Therapy: Anxious Overall Cognitive Status: Within Functional Limits for tasks assessed                                        Exercises      General Comments        Pertinent Vitals/Pain Pain Assessment: Faces Faces Pain Scale: Hurts whole lot Pain Location: incisions Pain Descriptors / Indicators: Aching;Grimacing;Guarding Pain Intervention(s): Limited activity within patient's tolerance;Monitored during session;Premedicated before session    Home Living                      Prior Function            PT Goals (current goals can now be found in the care plan section) Acute Rehab PT Goals Patient Stated Goal: pain relief PT Goal Formulation: With patient Time For Goal Achievement: 10/06/19 Potential to Achieve Goals:  Good Progress towards PT goals: Progressing toward goals    Frequency    Min 5X/week      PT Plan Current plan remains appropriate    Co-evaluation              AM-PAC PT "6 Clicks" Mobility   Outcome Measure  Help needed turning from your back to your side while in a flat bed without using bedrails?: None Help needed moving from lying on your back to sitting on the side of a flat bed without using bedrails?: A  Little Help needed moving to and from a bed to a chair (including a wheelchair)?: A Little Help needed standing up from a chair using your arms (e.g., wheelchair or bedside chair)?: A Little Help needed to walk in hospital room?: A Little Help needed climbing 3-5 steps with a railing? : A Lot 6 Click Score: 18    End of Session Equipment Utilized During Treatment: Gait belt Activity Tolerance: Patient tolerated treatment well Patient left: in bed;with call bell/phone within reach Nurse Communication: Mobility status PT Visit Diagnosis: Unsteadiness on feet (R26.81);Pain Pain - part of body: (back)     Time: [id number] PT Time Calculation (min) (ACUTE ONLY): 18 min  Charges:  $Gait Training: 8-22 mins                     Ellamae Sia, [place], DPT Acute Rehabilitation Services Pager [phone] Office [phone]    Willy Eddy 09/30/2019, 10:25 AM

## 2019-09-30 NOTE — Discharge Summary (Signed)
Patient ID: [name] MRN: [id number] DOB/AGE: 1961-09-25 58 y.o.  Admit date: 09/28/2019 Discharge date: 09/30/2019  Admission Diagnoses:  Active Problems:   Chronic pain   Discharge Diagnoses:  Active Problems:   Chronic pain  status post Procedure(s): THORACIC SPINAL CORD STIMULATOR INSERTION  Past Medical History:  Diagnosis Date  . Anxiety   . Back pain    related to spinal stenosis and disc problem, radiates down left buttocks to leg., weakness occ.  [name] Dyspnea   . Headache   . Hyperlipidemia   . Hypertension   . Lumbar post-laminectomy syndrome   . PONV (postoperative nausea and vomiting)   . Vaginal foreign object    "Uses Femring"    Surgeries: Procedure(s): THORACIC SPINAL CORD STIMULATOR INSERTION on 09/28/2019   Consultants:   Discharged Condition: Improved  Hospital Course: [name] is an 58 y.o. female who was admitted 09/28/2019 for operative treatment of Post laminectomy syndrome. Patient failed conservative treatments (please see the history and physical for the specifics) and had severe unremitting pain that affects sleep, daily activities and work/hobbies. After pre-op clearance, the patient was taken to the operating room on 09/28/2019 and underwent  Procedure(s): [place].    Patient was given perioperative antibiotics:  Anti-infectives (From admission, onward)   Start     Dose/Rate Route Frequency Ordered Stop   09/28/19 2100  vancomycin (VANCOCIN) IVPB 1000 mg/200 mL premix     1,000 mg 200 mL/hr over 60 Minutes Intravenous  Once 09/28/19 1719 09/28/19 2154   09/28/19 1900  vancomycin (VANCOCIN) IVPB 1000 mg/200 mL premix  Status:  Discontinued     1,000 mg 200 mL/hr over 60 Minutes Intravenous  Once 09/28/19 1718 09/28/19 1719   09/28/19 0846  vancomycin (VANCOCIN) IVPB 1000 mg/200 mL premix     1,000 mg 200 mL/hr over 60 Minutes Intravenous 60 min pre-op 09/28/19 0846 09/28/19 1003       Patient was given sequential compression devices and early ambulation to prevent DVT.   Patient benefited maximally from hospital stay and there were no complications. At the time of discharge, the patient was urinating/moving their bowels without difficulty, tolerating a regular diet, pain is controlled with oral pain medications and they have been cleared by PT/OT.   Recent vital signs:  Patient Vitals for the past 24 hrs:  BP Temp Temp src Pulse Resp SpO2  09/30/19 0713 131/76 97.9 F (36.6 C) - 80 16 99 %  09/30/19 0440 - 98.9 F (37.2 C) Oral - - -  09/30/19 0438 119/68 - - 76 16 100 %  09/29/19 2309 - 98.2 F (36.8 C) Oral - - -  09/29/19 2307 (!) 144/80 - - 85 20 98 %  09/29/19 2023 - 97.7 F (36.5 C) Oral - - -  09/29/19 2022 122/75 - - 87 - 100 %  09/29/19 1443 (!) 113/54 97.6 F (36.4 C) - 84 18 99 %     Recent laboratory studies: No results for input(s): WBC, HGB, HCT, PLT, NA, K, CL, CO2, BUN, CREATININE, GLUCOSE, INR, CALCIUM in the last 72 hours.  Invalid input(s): PT, 2   Discharge Medications:   Allergies as of 09/30/2019      Reactions   Cephalosporins Anaphylaxis   Penicillins Anaphylaxis, Hives   Did it involve swelling of the face/tongue/throat, SOB, or low BP? Yes Did it involve sudden or severe rash/hives, skin peeling, or any reaction on the inside of your mouth or  nose? Yes Did you need to seek medical attention at a hospital or doctor's office? Yes When did it last happen?10+ years If all above answers are "NO", may proceed with cephalosporin use.   Anesthetics, Amide Nausea And Vomiting   Does not know name of it. States they put it on record foot center.    Betadine [povidone Iodine] Other (See Comments)   "Skin Burn" caused scar on Left buttock   Peach [prunus Persica] Hives   Latex Hives, Itching, Rash      Medication List    STOP taking these medications   acetaminophen 500 MG tablet Commonly known as: TYLENOL   diclofenac  sodium 1 % Gel Commonly known as: VOLTAREN   Femring 0.05 MG/24HR Ring Generic drug: Estradiol Acetate   neomycin-bacitracin-polymyxin ointment Commonly known as: NEOSPORIN   oxyCODONE-acetaminophen 5-325 MG tablet Commonly known as: PERCOCET/ROXICET   traMADol 50 MG tablet Commonly known as: ULTRAM     TAKE these medications   ALPRAZolam 0.25 MG tablet Commonly known as: XANAX TAKE 1 TABLET BY MOUTH ONCE DAILY AS NEEDED FOR ANXIETY OR SLEEP What changed:   how much to take  how to take this  when to take this  reasons to take this  additional instructions   atorvastatin 20 MG tablet Commonly known as: LIPITOR Take 1 tablet (20 mg total) by mouth daily.   carvedilol 6.25 MG tablet Commonly known as: COREG Take 1 tablet (6.25 mg total) by mouth 2 (two) times daily.   diphenhydrAMINE 25 MG tablet Commonly known as: BENADRYL Take 25 mg by mouth daily as needed for allergies.   gabapentin 300 MG capsule Commonly known as: NEURONTIN Take 300 mg by mouth 4 (four) times daily. What changed: Another medication with the same name was removed. Continue taking this medication, and follow the directions you see here.   HYDROcodone-acetaminophen 5-325 MG tablet Commonly known as: NORCO/VICODIN Take 1-2 tablets by mouth every 6 (six) hours as needed for moderate pain.   methocarbamol 500 MG tablet Commonly known as: Robaxin Take 1 tablet (500 mg total) by mouth every 8 (eight) hours as needed for up to 5 days for muscle spasms. What changed: when to take this   ondansetron 4 MG tablet Commonly known as: Zofran Take 1 tablet (4 mg total) by mouth every 8 (eight) hours as needed for nausea or vomiting.   polyethylene glycol 17 g packet Commonly known as: MIRALAX / GLYCOLAX Take 17 g by mouth daily. What changed:   when to take this  reasons to take this       Diagnostic Studies: Dg Chest 2 View  Result Date: 09/23/2019 CLINICAL DATA:  Preoperative EXAM:  CHEST - 2 VIEW COMPARISON:  07/09/2017 FINDINGS: The heart size and mediastinal contours are within normal limits. Both lungs are clear. Disc degenerative disease of the thoracic spine. IMPRESSION: No acute abnormality of the lungs. Electronically Signed   By: Eddie Candle M.D.   On: 09/23/2019 12:30   Dg Thoracolumabar Spine  Result Date: 09/28/2019 CLINICAL DATA:  Thoracic spinal cord stimulator insertion. EXAM: THORACOLUMBAR SPINE 1V COMPARISON:  None. FINDINGS: Two fluoroscopic spot views in frontal and lateral projections. Spinal stimulator with tips posterior to T8-T9, assuming lower most rib-bearing vertebra is T12. total fluoroscopy time 34 seconds. IMPRESSION: Fluoroscopic spot views post spinal stimulator placement with tips posterior to T8-T9. Electronically Signed   By: Keith Rake M.D.   On: 09/28/2019 14:02   Dg C-arm 1-60 Min  Result Date:  09/28/2019 CLINICAL DATA:  Thoracic spinal cord stimulator insertion. EXAM: THORACOLUMBAR SPINE 1V COMPARISON:  None. FINDINGS: Two fluoroscopic spot views in frontal and lateral projections. Spinal stimulator with tips posterior to T8-T9, assuming lower most rib-bearing vertebra is T12. total fluoroscopy time 34 seconds. IMPRESSION: Fluoroscopic spot views post spinal stimulator placement with tips posterior to T8-T9. Electronically Signed   By: Keith Rake M.D.   On: 09/28/2019 14:02    Discharge Instructions    Incentive spirometry RT   Complete by: As directed       Follow-up Information    Melina Schools, MD. Schedule an appointment as soon as possible for a visit in 2 weeks.   Specialty: Orthopedic Surgery Why: If symptoms worsen, For suture removal, For wound re-check Contact information: [address] [place] [address] [phone]           Discharge Plan:  discharge to home  Disposition: stable    Signed: Yvonne Kendall Ward for [facility] PA-C [facility] [phone] 09/30/2019,  1:18 PM

## 2019-09-30 NOTE — Progress Notes (Signed)
Subjective: 2 Days Post-Op Procedure(s) (LRB): THORACIC SPINAL CORD STIMULATOR INSERTION (N/A) Patient reports pain as severe.  Describes pain over left buttock (battery site) that radiates into left thigh. Tolerating PO without N/V +ambulation +void +flatus Denies CP, calf pain, SOB, dizziness, sweats/chills.   Objective: Vital signs in last 24 hours: Temp:  [97.6 F (36.4 C)-98.9 F (37.2 C)] 97.9 F (36.6 C) (12/04 0713) Pulse Rate:  [76-87] 80 (12/04 0713) Resp:  [16-20] 16 (12/04 0713) BP: (113-144)/(54-80) 131/76 (12/04 0713) SpO2:  [98 %-100 %] 99 % (12/04 0713)  Intake/Output from previous day: 12/03 0701 - 12/04 0700 In: 960 [P.O.:960] Out: -  Intake/Output this shift: Total I/O In: 120 [P.O.:120] Out: -   No results for input(s): HGB in the last 72 hours. No results for input(s): WBC, RBC, HCT, PLT in the last 72 hours. No results for input(s): NA, K, CL, CO2, BUN, CREATININE, GLUCOSE, CALCIUM in the last 72 hours. No results for input(s): LABPT, INR in the last 72 hours.  Neurologically intact ABD soft Neurovascular intact Sensation intact distally Intact pulses distally Dorsiflexion/Plantar flexion intact Incision: dressing C/D/I No cellulitis present Compartment soft   Assessment/Plan: 2 Days Post-Op Procedure(s) (LRB): THORACIC SPINAL CORD STIMULATOR INSERTION (N/A) Advance diet Up with therapy  I did a 4cc 0.25% marcaine injection over the battery site. Additionally, last night we had her SCS settings increased. Patients pain has improved slightly and she states she would like to go home to recover. Plan D/C today. I have given the patient a perscription for a 5 day supply of medications. I will have her F/u with her pain management provider next week. Encourage IS DVT PPx: Teds, ambulation Patient to leave dressings in tact until 2 week post-op visit. She will f/u with Dr. Rolena Infante in 2 weeks.    Yvonne Kendall Ward 09/30/2019, 1:09 PM

## 2019-10-03 NOTE — Telephone Encounter (Signed)
Pt was seen 11/30 for pre-op.

## 2019-10-05 DIAGNOSIS — M5136 Other intervertebral disc degeneration, lumbar region: Secondary | ICD-10-CM | POA: Diagnosis not present

## 2019-10-05 MED FILL — HYDROCODON-APAP 10-325: 10-325 | 7 days supply | Qty: 30 | Fill #0

## 2019-10-10 MED FILL — GABAPENTIN 400 MG CAPSULE: 400 | 30 days supply | Qty: 90 | Fill #0

## 2019-10-12 ENCOUNTER — Other Ambulatory Visit: Payer: Self-pay | Admitting: Family Medicine

## 2019-10-12 MED FILL — CARVEDILOL 6.25 MG TABLET: 6.25 | 90 days supply | Qty: 180 | Fill #0

## 2019-10-24 DIAGNOSIS — Z79891 Long term (current) use of opiate analgesic: Secondary | ICD-10-CM | POA: Diagnosis not present

## 2019-10-24 DIAGNOSIS — M792 Neuralgia and neuritis, unspecified: Secondary | ICD-10-CM | POA: Diagnosis not present

## 2019-10-24 MED FILL — traMADol HCL 50 MG TABS: 50 | 20 days supply | Qty: 60 | Fill #0

## 2019-10-25 DIAGNOSIS — G894 Chronic pain syndrome: Secondary | ICD-10-CM | POA: Diagnosis not present

## 2019-11-11 DIAGNOSIS — M5416 Radiculopathy, lumbar region: Secondary | ICD-10-CM | POA: Diagnosis not present

## 2019-11-23 MED FILL — FEMRING 0.05 MG/24HR RING: 0.05 | 90 days supply | Qty: 1 | Fill #0

## 2019-12-02 NOTE — Progress Notes (Signed)
Patient scheduled 04/02/2020 at 10 AM

## 2019-12-13 ENCOUNTER — Telehealth: Payer: Self-pay | Admitting: Family Medicine

## 2019-12-13 ENCOUNTER — Other Ambulatory Visit: Payer: Self-pay | Admitting: Family Medicine

## 2019-12-13 DIAGNOSIS — F419 Anxiety disorder, unspecified: Secondary | ICD-10-CM

## 2019-12-13 MED ORDER — ALPRAZOLAM 0.25 MG PO TABS: ORAL_TABLET | ORAL | 0 refills | Status: DC

## 2019-12-13 MED FILL — ALPRAZolam 0.25 MG TABS: 0.25 | 30 days supply | Qty: 30 | Fill #0

## 2019-12-13 NOTE — Telephone Encounter (Signed)
Pt is aware Rx was sent into pharmacy.

## 2019-12-13 NOTE — Telephone Encounter (Signed)
Last Rx filled by you for anxiety medication was the Alprazolam 0.25mg  tablets on 06/21/2019.

## 2019-12-13 NOTE — Telephone Encounter (Signed)
Rx sent. Thanks, BJ 

## 2019-12-13 NOTE — Telephone Encounter (Signed)
Pt had surgery in Dec for stimulation Implants and starting to have anxiety and wondering if [name] will refill her anxiety medication  Medication refill: Lorazepam0.5 MG  Pharmacy: [place] Fax: [phone]

## 2019-12-20 DIAGNOSIS — Z4889 Encounter for other specified surgical aftercare: Secondary | ICD-10-CM | POA: Diagnosis not present

## 2019-12-20 DIAGNOSIS — M7918 Myalgia, other site: Secondary | ICD-10-CM | POA: Diagnosis not present

## 2019-12-21 DIAGNOSIS — M545 Low back pain: Secondary | ICD-10-CM | POA: Diagnosis not present

## 2019-12-21 DIAGNOSIS — Z79891 Long term (current) use of opiate analgesic: Secondary | ICD-10-CM | POA: Diagnosis not present

## 2019-12-21 MED FILL — GABAPENTIN 400 MG CAPSULE: 400 | 30 days supply | Qty: 90 | Fill #0

## 2019-12-21 MED FILL — traMADol HCL 50 MG TABS: 50 | 20 days supply | Qty: 60 | Fill #0

## 2019-12-21 MED FILL — BACLOFEN 10 MG TABS: 10 | 30 days supply | Qty: 90 | Fill #0

## 2019-12-23 ENCOUNTER — Encounter: Payer: BC Managed Care – PPO | Admitting: Registered Nurse

## 2019-12-30 ENCOUNTER — Other Ambulatory Visit: Payer: Self-pay

## 2019-12-30 ENCOUNTER — Encounter: Payer: Self-pay | Admitting: Registered Nurse

## 2019-12-30 ENCOUNTER — Encounter: Payer: BC Managed Care – PPO | Attending: Registered Nurse | Admitting: Registered Nurse

## 2019-12-30 VITALS — BP 155/92 | HR 75 | Temp 97.2°F | Ht 65.0 in | Wt 180.2 lb

## 2019-12-30 DIAGNOSIS — Z9889 Other specified postprocedural states: Secondary | ICD-10-CM | POA: Diagnosis not present

## 2019-12-30 DIAGNOSIS — M5416 Radiculopathy, lumbar region: Secondary | ICD-10-CM | POA: Diagnosis not present

## 2019-12-30 DIAGNOSIS — G894 Chronic pain syndrome: Secondary | ICD-10-CM | POA: Diagnosis not present

## 2019-12-30 DIAGNOSIS — Z79891 Long term (current) use of opiate analgesic: Secondary | ICD-10-CM | POA: Diagnosis not present

## 2019-12-30 DIAGNOSIS — Z5181 Encounter for therapeutic drug level monitoring: Secondary | ICD-10-CM | POA: Insufficient documentation

## 2019-12-30 NOTE — Progress Notes (Signed)
Subjective:    Patient ID: [name], female    DOB: 01/28/1961, 59 y.o.   MRN: [id number]  HPI: [name] is a 59 y.o. female who returns for follow up appointment for chronic pain and polyradiculopathy. She states her pain is located in her lower back radiating into her left hip. She rates her pain 4. Her current exercise regime is walking, she also reports she will be resuming water aerobics.  [name] Morphine equivalent is 15.00 MME, Dr. Nelva Bush prescribing her Tramadol. .  .  [name] she is also prescribed Alprazolam by Dr. [name] We have discussed the black box warning of using opioids and benzodiazepines. I highlighted the dangers of using these drugs together and discussed the adverse events including respiratory suppression, overdose, cognitive impairment and importance of compliance with current regimen. We will continue to monitor and adjust as indicated.   [name] was admitted to [facility] on 12/02.2020 and Discharged on 09/30/2019, she was admitted for Thoracic Spinal Cors Stimulator by Dr. Rolena Infante.     Pain Inventory Average Pain 4 Pain Right Now 4 My pain is sharp, burning, tingling and aching  In the last 24 hours, has pain interfered with the following? General activity 5 Relation with others 4 Enjoyment of life 6 What TIME of day is your pain at its worst? morning evening and night Sleep (in general) Fair  Pain is worse with: walking, bending, sitting, standing and some activites Pain improves with: rest, heat/ice, therapy/exercise and injections Relief from Meds: 7  Mobility walk with assistance use a cane use a walker ability to climb steps?  yes do you drive?  no  Function disabled: date disabled . I need assistance with the following:  meal prep and household duties  Neuro/Psych numbness tingling trouble walking spasms dizziness anxiety  Prior Studies Any changes since last visit?   yes x-rays  Physicians involved in your care Any changes since last visit?  no Primary care .   Family History  Problem Relation Age of Onset  . Heart attack Mother   . Lung cancer Father   . Cancer Father   . Pancreatic cancer Sister   . Breast cancer Sister 23  . Throat cancer Brother   . Multiple myeloma Sister   . Breast cancer Sister        diagnosed in her 32's  . Heart attack Sister   . Stomach cancer Cousin   . Colon cancer Neg Hx    Social History   Socioeconomic History  . Marital status: Married    Spouse name: Not on file  . Number of children: Not on file  . Years of education: Not on file  . Highest education level: Not on file  Occupational History  . Not on file  Tobacco Use  . Smoking status: Never Smoker  . Smokeless tobacco: Never Used  Substance and Sexual Activity  . Alcohol use: No  . Drug use: No  . Sexual activity: Yes    Partners: Male    Birth control/protection: Post-menopausal  Other Topics Concern  . Not on file  Social History Narrative  . Not on file   Social Determinants of Health   Financial Resource Strain:   . Difficulty of Paying Living Expenses: Not on file  Food Insecurity:   . Worried About Charity fundraiser in the Last Year: Not on file  . Ran Out of Food in the Last Year: Not on file  Transportation Needs:   . Film/video editor (Medical): Not on file  . Lack of Transportation (Non-Medical): Not on file  Physical Activity:   . Days of Exercise per Week: Not on file  . Minutes of Exercise per Session: Not on file  Stress:   . Feeling of Stress : Not on file  Social Connections:   . Frequency of Communication with Friends and Family: Not on file  . Frequency of Social Gatherings with Friends and Family: Not on file  . Attends Religious Services: Not on file  . Active Member of Clubs or Organizations: Not on file  . Attends Archivist Meetings: Not on file  . Marital Status: Not on file   Past  Surgical History:  Procedure Laterality Date  . ABDOMINAL HYSTERECTOMY    . CARDIAC CATHETERIZATION N/A 04/18/2015   Procedure: Left Heart Cath and Coronary Angiography;  Surgeon: [name], MD;  Location: [facility];  Service: Cardiovascular;  Laterality: N/A;  . COLONOSCOPY W/ BIOPSIES AND POLYPECTOMY    . FOOT SURGERY Bilateral    [place] "bunion,bone spur, tendon" (1) -6'16, (1)-10'16  . IR EPIDUROGRAPHY  07/21/2018  . LUMBAR LAMINECTOMY/DECOMPRESSION MICRODISCECTOMY Bilateral 12/28/2015   Procedure: MICRO LUMBAR DECOMPRESSION L4 - L5 BILATERALLY;  Surgeon: [name], MD;  Location: [facility];  Service: Orthopedics;  Laterality: Bilateral;  . LUMBAR LAMINECTOMY/DECOMPRESSION MICRODISCECTOMY Bilateral 03/04/2018   Procedure: Revision of Microlumbar Decompression Bilateral Lumbar Four-Five;  Surgeon: [name], MD;  Location: [place];  Service: Orthopedics;  Laterality: Bilateral;  90 mins  . SPINAL CORD STIMULATOR INSERTION N/A 09/28/2019   Procedure: THORACIC SPINAL CORD STIMULATOR INSERTION;  Surgeon: Melina Schools, MD;  Location: [place];  Service: Orthopedics;  Laterality: N/A;  2.5 hrs  . TUBAL LIGATION    . WISDOM TOOTH EXTRACTION    . WOUND EXPLORATION N/A 03/04/2018   Procedure: EXPLORATION OF LUMBAR DECOMPRESSION WOUND;  Surgeon: [name], MD;  Location: [place];  Service: Orthopedics;  Laterality: N/A;   Past Medical History:  Diagnosis Date  . Anxiety   . Back pain    related to spinal stenosis and disc problem, radiates down left buttocks to leg., weakness occ.  [name] Dyspnea   . Headache   . Hyperlipidemia   . Hypertension   . Lumbar post-laminectomy syndrome   . PONV (postoperative nausea and vomiting)   . Vaginal foreign object    "Uses Femring"   BP (!) 157/116   Pulse 77   Temp (!) 97.2 F (36.2 C)   Ht '5\' 5"'$  (1.651 m)   Wt 180 lb 3.2 oz (81.7 kg)   SpO2 98%   BMI 29.99 kg/m   Opioid Risk Score:   Fall Risk Score:  `1  Depression  screen PHQ 2/9  Depression screen [facility] 2/9 08/17/2018 07/05/2018 05/03/2018 04/01/2018  Decreased Interest 0 0 0 1  Down, Depressed, Hopeless 0 0 0 1  PHQ - 2 Score 0 0 0 2  Altered sleeping - - - 1  Tired, decreased energy - - - 1  Change in appetite - - - 1  Feeling bad or failure about yourself  - - - 0  Trouble concentrating - - - 0  Moving slowly or fidgety/restless - - - 0  Suicidal thoughts - - - 0  PHQ-9 Score - - - 5  Difficult doing work/chores - - - Somewhat difficult  Some recent data might be hidden     Review of Systems  Musculoskeletal: Positive for back pain and gait problem.  Neurological: Positive for dizziness.  Psychiatric/Behavioral: The patient is nervous/anxious.   All other systems reviewed and are negative.      Objective:   Physical Exam Vitals and nursing note reviewed.  Constitutional:      Appearance: Normal appearance.  Cardiovascular:     Rate and Rhythm: Normal rate and regular rhythm.     Pulses: Normal pulses.     Heart sounds: Normal heart sounds.  Pulmonary:     Effort: Pulmonary effort is normal.     Breath sounds: Normal breath sounds.  Musculoskeletal:     Cervical back: Normal range of motion and neck supple.     Comments: Normal Muscle Bulk and Muscle Testing Reveals:  Upper Extremities: Full ROM and Muscle Strength 5/5  Lumbar Hypersensitivity Lower Extremities: Right: Decreased ROM and Muscle Strength 4/5 Wearing Right AFO Left: Full ROM and Muscle Strength 5/5 Arises from Table Slowly using walker for support Wide Based Gait   Skin:    General: Skin is warm and dry.  Neurological:     Mental Status: She is alert and oriented to person, place, and time.  Psychiatric:        Mood and Affect: Mood normal.        Behavior: Behavior normal.           Assessment & Plan:  1. Post Lumbar Laminectomy / Decompression: Lumbar Radiculopathy: [name] Following. Continue Gabapentin. S/P Thoracic Spinal Cord Stimulator on  09/28/2019 by Dr. Rolena Infante.  Dr. Nelva Bush prescribing Tramadol. Continue to Monitor. 12/30/2019 2. Anxiety. PCP Following and prescribing Alprazolam. Education Provided. Continue to Monitor.   15 minutes minutes of face to face patient care time was spent during this visit. All questions were encouraged and answered.  F/U in 6 months

## 2020-01-04 ENCOUNTER — Telehealth: Payer: Self-pay | Admitting: *Deleted

## 2020-01-04 DIAGNOSIS — M5416 Radiculopathy, lumbar region: Secondary | ICD-10-CM

## 2020-01-04 DIAGNOSIS — Z9889 Other specified postprocedural states: Secondary | ICD-10-CM

## 2020-01-04 NOTE — Telephone Encounter (Signed)
Placed a call to Ms. Siner, she reports Dr. Rolena Infante has released her to performed Aquatic Therapy, also stated she call their office yesterday and spoke with someone. She was asked to call Dr. Rolena Infante office to send our office a note regarding the above, she verbalizes understanding.

## 2020-01-04 NOTE — Telephone Encounter (Addendum)
Patient left a message stating that she would like a referral for Aquatic therapy.  I contacted the patient to clarify.  She stated that early last year she participated in an aquatic therapy program through [facility]. She said that she had surgery with Dr. Rolena Infante back in December and he said it would be a good idea as well.   I contacted Guido Sander, PT through [employer] and she states the program is current.   Her message to me read "Good Morning Mr. Adelene Amas, I know [name] has been anxious to resume the aquatic therapy. The program is still provided at the [facility] through [facility] - both PT & OT offer aquatic therapy on Mon. & Wed.'s from 1:30 - 4:30. If you could please put an order in Epic for "PT - Aquatic therapy. I will see [name] on land for the first visit for the eval and then see her in pool for all treatment sessions. Thank you for your help in getting her back in the pool - I am out of office today but will be back tomorrow if you have any questions please give me a call at [phone]. Thank you so much again!

## 2020-01-12 ENCOUNTER — Other Ambulatory Visit: Payer: Self-pay

## 2020-01-12 ENCOUNTER — Encounter: Payer: Self-pay | Admitting: Physical Therapy

## 2020-01-12 ENCOUNTER — Ambulatory Visit: Payer: BC Managed Care – PPO | Attending: Registered Nurse | Admitting: Physical Therapy

## 2020-01-12 DIAGNOSIS — M5442 Lumbago with sciatica, left side: Secondary | ICD-10-CM | POA: Diagnosis not present

## 2020-01-12 DIAGNOSIS — M6281 Muscle weakness (generalized): Secondary | ICD-10-CM | POA: Insufficient documentation

## 2020-01-12 DIAGNOSIS — R2689 Other abnormalities of gait and mobility: Secondary | ICD-10-CM

## 2020-01-12 DIAGNOSIS — G8929 Other chronic pain: Secondary | ICD-10-CM

## 2020-01-12 DIAGNOSIS — R2681 Unsteadiness on feet: Secondary | ICD-10-CM | POA: Diagnosis not present

## 2020-01-13 ENCOUNTER — Encounter: Payer: Self-pay | Admitting: Physical Therapy

## 2020-01-13 NOTE — Therapy (Signed)
[facility] [address] [place], [name], [address] Phone: [phone]   Fax:  [phone]  Physical Therapy Evaluation  Patient Details  Name: [name] MRN: [id number] Date of Birth: 09-08-1961 Referring Provider (PT): Danella Sensing, NP   Encounter Date: 01/12/2020  PT End of Session - 01/13/20 1426    Visit Number  1    Number of Visits  9    Date for PT Re-Evaluation  03/13/20    Authorization Type  BCBS    Authorization - Visit Number  1    PT Start Time  [id number]    PT Stop Time  1150    PT Time Calculation (min)  47 min    Activity Tolerance  Patient tolerated treatment well    Behavior During Therapy  [facility] for tasks assessed/performed       Past Medical History:  Diagnosis Date  . Anxiety   . Back pain    related to spinal stenosis and disc problem, radiates down left buttocks to leg., weakness occ.  [name] Dyspnea   . Headache   . Hyperlipidemia   . Hypertension   . Lumbar post-laminectomy syndrome   . PONV (postoperative nausea and vomiting)   . Vaginal foreign object    "Uses Femring"    Past Surgical History:  Procedure Laterality Date  . ABDOMINAL HYSTERECTOMY    . CARDIAC CATHETERIZATION N/A 04/18/2015   Procedure: Left Heart Cath and Coronary Angiography;  Surgeon: [name], MD;  Location: [facility];  Service: Cardiovascular;  Laterality: N/A;  . COLONOSCOPY W/ BIOPSIES AND POLYPECTOMY    . FOOT SURGERY Bilateral    [place] "bunion,bone spur, tendon" (1) -6'16, (1)-10'16  . IR EPIDUROGRAPHY  07/21/2018  . LUMBAR LAMINECTOMY/DECOMPRESSION MICRODISCECTOMY Bilateral 12/28/2015   Procedure: MICRO LUMBAR DECOMPRESSION L4 - L5 BILATERALLY;  Surgeon: [name], MD;  [facility];  Service: Orthopedics;  Laterality: Bilateral;  . LUMBAR LAMINECTOMY/DECOMPRESSION MICRODISCECTOMY Bilateral 03/04/2018   Procedure: Revision of Microlumbar Decompression Bilateral Lumbar  Four-Five;  Surgeon: [name], MD;  Location: [facility];  Service: Orthopedics;  Laterality: Bilateral;  90 mins  . SPINAL CORD STIMULATOR INSERTION N/A 09/28/2019   Procedure: THORACIC SPINAL CORD STIMULATOR INSERTION;  Surgeon: Melina Schools, MD;  Location: [facility];  Service: Orthopedics;  Laterality: N/A;  2.5 hrs  . TUBAL LIGATION    . WISDOM TOOTH EXTRACTION    . WOUND EXPLORATION N/A 03/04/2018   Procedure: EXPLORATION OF LUMBAR DECOMPRESSION WOUND;  Surgeon: [name], MD;  Location: [facility];  Service: Orthopedics;  Laterality: N/A;    There were no vitals filed for this visit.   Subjective Assessment - 01/12/20 1130    Subjective  Pt had thoracic spinal cord pain stimulator implant on 09-28-19; pt reports it feels like bees are stinging - reports it is not as bad today at this current time as it has been, but will get worse as the day progresses; pt states she feels like her legs are weaker than they were before the surgery    Pertinent History  decompression and laminectomy on 12/28/15 at L4-L5  with redo decompression surgery on May 9,2019, post-op paraparesis with surgery to remove small hematoma; 07/12/18 fall at work (involved rolling chair) with hospital admission, transfer to [facility] with d/c home 08/03/18; HTN, Bil foot surgery     Patient Stated Goals  improve leg strength and balance through aquatic therapy    Currently in Pain?  Yes    Pain Score  4     Pain Orientation  Left;Lower    Pain Descriptors / Indicators  Other (Comment);Burning;Pins and needles   stinging   Pain Type  Neuropathic pain    Pain Onset  More than a month ago    Pain Frequency  Intermittent    Aggravating Factors   prolonged sitting    Pain Relieving Factors  intermittent movement and stretching         [facility] PT Assessment - 01/13/20 0001      Assessment   Medical Diagnosis  h/o Lumbar Radiculopathy:  s/p thoracic pain stimulator implant     Referring Provider (PT)  Danella Sensing, NP    Onset  Date/Surgical Date  09/28/19   for pain stimulator implant    Prior Therapy  pt had PT at this facility in 2020 (aquatic therapy included Jun - Oct. 2020)       Precautions   Precautions  Fall      Restrictions   Weight Bearing Restrictions  No      Balance Screen   Has the patient fallen in the past 6 months  No    Has the patient had a decrease in activity level because of a fear of falling?   No    Is the patient reluctant to leave their home because of a fear of falling?   No      Prior Function   Level of Independence  Independent with household mobility with device;Independent with community mobility with device;Independent with basic ADLs      ROM / Strength   AROM / PROM / Strength  Strength      Strength   Overall Strength  Deficits    Overall Strength Comments  pain in low back with resistance     Right Hip Flexion  3+/5    Left Hip Flexion  4/5    Right Knee Extension  4-/5    Left Knee Extension  4/5    Right Ankle Dorsiflexion  2+/5    Left Ankle Dorsiflexion  4/5      Ambulation/Gait   Ambulation/Gait  Yes    Ambulation/Gait Assistance  6: Modified independent (Device/Increase time)    Ambulation Distance (Feet)  100 Feet    Assistive device  Rolling walker    Gait Pattern  Step-through pattern;Decreased hip/knee flexion - right;Decreased dorsiflexion - right    Ambulation Surface  Level;Indoor    Gait velocity  36.78 secs = .89 f/tsec   with RW with AFO on RLE     Standardized Balance Assessment   Standardized Balance Assessment  Timed Up and Go Test      Timed Up and Go Test   TUG  Normal TUG    Normal TUG (seconds)  41.4   with RW               Objective measurements completed on examination: See above findings.                PT Short Term Goals - 01/13/20 1437      PT SHORT TERM GOAL #1   Title  Pt will report decreased pain in low and Rt hip area after aquatic therapy to </= 3/10 for increased ease with mobility.     Baseline  4/10 on 01-12-20    Time  4    Period  Weeks    Status  New    Target Date  02/24/20   target date extended due to wait list for aquatic therapy     PT SHORT TERM GOAL #2   Title  Pt will report ability to amb. with 1 forearm crutch for 25' in home environment.    Time  4    Period  Weeks    Status  New    Target Date  02/24/20        PT Long Term Goals - 01/13/20 1440      PT LONG TERM GOAL #1   Title  Pt will be independent in HEP for aquatic exercises to be continued upon discharge from PT.    Time  8    Period  Weeks    Status  New    Target Date  03/23/20      PT LONG TERM GOAL #2   Title  Pt will report 50% decrease in pain (</= 2/10 intensity) in low back/Rt hip region for increased comfort and ease with mobility and ADL's.    Baseline  4/10 intensity on 01-12-20    Time  8    Period  Weeks    Status  New    Target Date  03/23/20             Plan - 01/13/20 1429    Clinical Impression Statement  Pt presents with bil. LE weakness with RLE weaker than LLE and with spasticity due to h/o lumbar radiculopathy and lumbar post laminectomy syndrome.  Pt underwent thoracic spinal cord pain stimulator implant on 09-28-19; pt states she continues to have moderate pain in Rt hip and low back area which varies in intensity.    Personal Factors and Comorbidities  Past/Current Experience;Comorbidity 2;Time since onset of injury/illness/exacerbation    Examination-Activity Limitations  Stand;Locomotion Level;Bend;Squat;Stairs;Transfers;Carry    Examination-Participation Restrictions  Cleaning;Community Activity;Meal Prep;Shop    Stability/Clinical Decision Making  Stable/Uncomplicated    Clinical Decision Making  Low    Clinical Presentation due to:  paraparesis due to lumbar radiculopathy    Rehab Potential  Good    PT Frequency  1x / week    PT Duration  8 weeks    PT Treatment/Interventions  Manual techniques;Patient/family education;Therapeutic  exercise;Therapeutic activities;Gait training;ADLs/Self Care Home Management;Neuromuscular re-education;Functional mobility training;Passive range of motion;Balance training;Aquatic Therapy;Biofeedback;DME Instruction;Stair training;Orthotic Fit/Training;Dry needling    PT Next Visit Plan  cont aquatic therapy for back pain and for improved mobility    PT Home Exercise Plan  --    Consulted and Agree with Plan of Care  Patient       Patient will benefit from skilled therapeutic intervention in order to improve the following deficits and impairments:  Pain, Postural dysfunction, Difficulty walking, Decreased strength, Decreased activity tolerance, Abnormal gait, Increased muscle spasms, Decreased endurance, Decreased balance, Decreased coordination, Decreased knowledge of use of DME, Decreased mobility, Decreased safety awareness, Impaired sensation, Impaired tone  Visit Diagnosis: Other abnormalities of gait and mobility - Plan: PT plan of care cert/re-cert  Chronic left-sided low back pain with left-sided sciatica - Plan: PT plan of care cert/re-cert  Unsteadiness on feet - Plan: PT plan of care cert/re-cert  Muscle weakness (generalized) - Plan: PT plan of care cert/re-cert     Problem List Patient Active Problem List   Diagnosis Date Noted  . Chronic pain 09/28/2019  . Lumbar spinal stenosis   . Radicular pain   . Gait disorder   . Difficulty in urination 07/13/2018  . Back pain 07/12/2018  . Hyperlipidemia 04/16/2018  .  Anxiety disorder, unspecified 04/16/2018  . AKI (acute kidney injury) ([place])   . Benign essential HTN   . Depression with anxiety   . Lumbar radiculopathy 03/09/2018  . Paraparesis ([place]) 03/09/2018  . Essential hypertension   . Chronic nonintractable headache   . Leukocytosis   . Acute blood loss anemia   . Postoperative pain   . Generalized weakness   . Spinal stenosis at L4-L5 level 03/04/2018  . Spinal stenosis of lumbar region 12/28/2015  . Chest  pain 04/16/2015    Alda Lea, PT 01/13/2020, 2:47 PM  [facility] [address] [place], [name], [address] Phone: [phone]   Fax:  [phone]  Name: [name] MRN: [id number] Date of Birth: 1960-11-16

## 2020-01-18 DIAGNOSIS — Z4889 Encounter for other specified surgical aftercare: Secondary | ICD-10-CM | POA: Diagnosis not present

## 2020-01-30 ENCOUNTER — Other Ambulatory Visit: Payer: Self-pay | Admitting: Family Medicine

## 2020-01-30 NOTE — Telephone Encounter (Signed)
atorvastatin (LIPITOR) 20 MG tablet [facility], [place] Phone:  [phone]  Fax:  [phone]     Patient is completely out

## 2020-01-31 ENCOUNTER — Other Ambulatory Visit: Payer: Self-pay | Admitting: *Deleted

## 2020-01-31 DIAGNOSIS — E785 Hyperlipidemia, unspecified: Secondary | ICD-10-CM

## 2020-01-31 MED ORDER — ATORVASTATIN CALCIUM 20 MG PO TABS: 20.0000 mg | ORAL_TABLET | Freq: Every day | ORAL | 0 refills | Status: DC

## 2020-01-31 NOTE — Telephone Encounter (Signed)
Rx refilled and sent to preferred pharmacy  

## 2020-02-06 ENCOUNTER — Other Ambulatory Visit: Payer: Self-pay

## 2020-02-06 ENCOUNTER — Ambulatory Visit: Payer: BC Managed Care – PPO | Attending: Registered Nurse | Admitting: Physical Therapy

## 2020-02-06 DIAGNOSIS — R29818 Other symptoms and signs involving the nervous system: Secondary | ICD-10-CM | POA: Diagnosis not present

## 2020-02-06 DIAGNOSIS — R2689 Other abnormalities of gait and mobility: Secondary | ICD-10-CM | POA: Insufficient documentation

## 2020-02-06 DIAGNOSIS — G8929 Other chronic pain: Secondary | ICD-10-CM | POA: Diagnosis not present

## 2020-02-06 DIAGNOSIS — M5442 Lumbago with sciatica, left side: Secondary | ICD-10-CM | POA: Diagnosis not present

## 2020-02-06 DIAGNOSIS — M6281 Muscle weakness (generalized): Secondary | ICD-10-CM | POA: Insufficient documentation

## 2020-02-07 ENCOUNTER — Encounter: Payer: Self-pay | Admitting: Physical Therapy

## 2020-02-07 ENCOUNTER — Other Ambulatory Visit: Payer: Self-pay

## 2020-02-07 DIAGNOSIS — E785 Hyperlipidemia, unspecified: Secondary | ICD-10-CM

## 2020-02-07 MED ORDER — ATORVASTATIN CALCIUM 20 MG PO TABS: 20.0000 mg | ORAL_TABLET | Freq: Every day | ORAL | 0 refills | Status: DC

## 2020-02-07 MED FILL — ATORVASTATIN 20 MG TABLET: 20 | 90 days supply | Qty: 90 | Fill #0

## 2020-02-07 NOTE — Telephone Encounter (Signed)
Pt spoke to the pharmacy and they have not received it. She needs this refill for atorvastatin resent to [facility]   Pt has been out for two weeks now and she needs it sent ASAP

## 2020-02-07 NOTE — Therapy (Signed)
[facility] [address] [place], [name], [address] Phone: [phone]   Fax:  [phone]  Physical Therapy Treatment  Patient Details  Name: [name] MRN: [id number] Date of Birth: 12/17/1960 Referring Provider (PT): Danella Sensing, NP   Encounter Date: 02/06/2020  PT End of Session - 02/07/20 2000    Visit Number  2    Number of Visits  9    Date for PT Re-Evaluation  03/13/20    Authorization Type  BCBS    Authorization - Visit Number  2    PT Start Time  1500    PT Stop Time  1556    PT Time Calculation (min)  56 min    Equipment Utilized During Treatment  Other (comment)   pool noodle   Activity Tolerance  Patient tolerated treatment well    Behavior During Therapy  [facility] for tasks assessed/performed       Past Medical History:  Diagnosis Date  . Anxiety   . Back pain    related to spinal stenosis and disc problem, radiates down left buttocks to leg., weakness occ.  [name] Dyspnea   . Headache   . Hyperlipidemia   . Hypertension   . Lumbar post-laminectomy syndrome   . PONV (postoperative nausea and vomiting)   . Vaginal foreign object    "Uses Femring"    Past Surgical History:  Procedure Laterality Date  . ABDOMINAL HYSTERECTOMY    . CARDIAC CATHETERIZATION N/A 04/18/2015   Procedure: Left Heart Cath and Coronary Angiography;  Surgeon: [name], MD;  Location: [facility];  Service: Cardiovascular;  Laterality: N/A;  . COLONOSCOPY W/ BIOPSIES AND POLYPECTOMY    . FOOT SURGERY Bilateral    [place] "bunion,bone spur, tendon" (1) -6'16, (1)-10'16  . IR EPIDUROGRAPHY  07/21/2018  . LUMBAR LAMINECTOMY/DECOMPRESSION MICRODISCECTOMY Bilateral 12/28/2015   Procedure: MICRO LUMBAR DECOMPRESSION L4 - L5 BILATERALLY;  Surgeon: [name], MD;  [facility];  Service: Orthopedics;  Laterality: Bilateral;  . LUMBAR LAMINECTOMY/DECOMPRESSION MICRODISCECTOMY Bilateral 03/04/2018    Procedure: Revision of Microlumbar Decompression Bilateral Lumbar Four-Five;  Surgeon: [name], MD;  Location: [facility];  Service: Orthopedics;  Laterality: Bilateral;  90 mins  . SPINAL CORD STIMULATOR INSERTION N/A 09/28/2019   Procedure: THORACIC SPINAL CORD STIMULATOR INSERTION;  Surgeon: Melina Schools, MD;  Location: [facility];  Service: Orthopedics;  Laterality: N/A;  2.5 hrs  . TUBAL LIGATION    . WISDOM TOOTH EXTRACTION    . WOUND EXPLORATION N/A 03/04/2018   Procedure: EXPLORATION OF LUMBAR DECOMPRESSION WOUND;  Surgeon: [name], MD;  Location: [facility];  Service: Orthopedics;  Laterality: N/A;    There were no vitals filed for this visit.  Subjective Assessment - 02/07/20 1952    Subjective  Pt states stimulator is still uncomfortable - feels like bees are stinging at site of the implant in her Lt hip.  Pt says Dr. Rolena Infante says he could surgically move the implant to a new location in her hip if she so wanted - pt states she is looking forward to aquatic therapy session    Pertinent History  decompression and laminectomy on 12/28/15 at L4-L5  with redo decompression surgery on May 9,2019, post-op paraparesis with surgery to remove small hematoma; 07/12/18 fall at work (involved rolling chair) with hospital admission, transfer to [facility] with d/c home 08/03/18; HTN, Bil foot surgery     Patient Stated Goals  improve leg strength and balance  through aquatic therapy    Currently in Pain?  Yes    Pain Score  6     Pain Location  Back    Pain Orientation  Lower;Left    Pain Descriptors / Indicators  Burning;Pins and needles    Pain Type  Neuropathic pain    Pain Onset  More than a month ago    Pain Frequency  Intermittent          Aquatic therapy at [facility] - pool temp 87.6 degrees   Patient seen for aquatic therapy today.  Treatment took place in water 3.5-4 feet deep depending upon activity.  Pt entered and  Exited the pool via step negotiation with use of bilateral hand rails with  min assist for safety.  Pt performed runner's stretch on each leg and heel cord stretch bil. LE's with feet on pool wall - 30 sec hold each stretch x 1 rep Pt performed amb. 36m across pool 2 reps forward; backwards x 1 rep, sideways 2 reps with use of noodle for UE support with therapist holding noodle for stability   Pt performed standing hip flexion, abduction and hip extension with knee extended -  10 reps each leg with UE support  Marching in place 10 reps each leg with UE support; marching 1m x 1 rep across pool with UE support on pool noodle  Squats x 10 reps with bil. UE support on pool edge Heel raises x 10 reps - bilateral LE's with UE support on pool wall   Pt requires aquatic therapy for the unweighting provided by the buoyancy of the water and needs the viscosity for strengthening LE's and trunk musc.; buoyancy of water needed for spinal decompression for increased tolerance for movement with less pain experienced than with land exercises                                PT Short Term Goals - 02/07/20 2006      PT SHORT TERM GOAL #1   Title  Pt will report decreased pain in low and Rt hip area after aquatic therapy to </= 3/10 for increased ease with mobility.    Baseline  4/10 on 01-12-20    Time  4    Period  Weeks    Status  New    Target Date  02/24/20   target date extended due to wait list for aquatic therapy     PT SHORT TERM GOAL #2   Title  Pt will report ability to amb. with 1 forearm crutch for 25' in home environment.    Time  4    Period  Weeks    Status  New    Target Date  02/24/20        PT Long Term Goals - 02/07/20 2006      PT LONG TERM GOAL #1   Title  Pt will be independent in HEP for aquatic exercises to be continued upon discharge from PT.    Time  8    Period  Weeks    Status  New      PT LONG TERM GOAL #2   Title  Pt will report 50% decrease in pain (</= 2/10 intensity) in low back/Rt hip region for increased  comfort and ease with mobility and ADL's.    Baseline  4/10 intensity on 01-12-20    Time  8    Period  Weeks    Status  New            Plan - 02/07/20 2002    Clinical Impression Statement  Pt tolerated aquatic exercises well with pt holding onto noodle for support with water walking.  RLE remains weaker than LLE with more spasticity noted in Rt hamstrings with minimal isolated knee flexion Rt hamstrings in swing phase of gait.  Pt declined use of aquatic cuffs at today's initial aquatic therapy session due to LE weakness.    Personal Factors and Comorbidities  Past/Current Experience;Comorbidity 2;Time since onset of injury/illness/exacerbation    Examination-Activity Limitations  Stand;Locomotion Level;Bend;Squat;Stairs;Transfers;Carry    Examination-Participation Restrictions  Cleaning;Community Activity;Meal Prep;Shop    Stability/Clinical Decision Making  Stable/Uncomplicated    Rehab Potential  Good    PT Frequency  1x / week    PT Duration  8 weeks    PT Treatment/Interventions  Manual techniques;Patient/family education;Therapeutic exercise;Therapeutic activities;Gait training;ADLs/Self Care Home Management;Neuromuscular re-education;Functional mobility training;Passive range of motion;Balance training;Aquatic Therapy;Biofeedback;DME Instruction;Stair training;Orthotic Fit/Training;Dry needling    PT Next Visit Plan  cont aquatic therapy for back pain and for improved mobility    Consulted and Agree with Plan of Care  Patient       Patient will benefit from skilled therapeutic intervention in order to improve the following deficits and impairments:  Pain, Postural dysfunction, Difficulty walking, Decreased strength, Decreased activity tolerance, Abnormal gait, Increased muscle spasms, Decreased endurance, Decreased balance, Decreased coordination, Decreased knowledge of use of DME, Decreased mobility, Decreased safety awareness, Impaired sensation, Impaired tone  Visit  Diagnosis: Other abnormalities of gait and mobility  Muscle weakness (generalized)  Chronic left-sided low back pain with left-sided sciatica     Problem List Patient Active Problem List   Diagnosis Date Noted  . Chronic pain 09/28/2019  . Lumbar spinal stenosis   . Radicular pain   . Gait disorder   . Difficulty in urination 07/13/2018  . Back pain 07/12/2018  . Hyperlipidemia 04/16/2018  . Anxiety disorder, unspecified 04/16/2018  . AKI (acute kidney injury) ([place])   . Benign essential HTN   . Depression with anxiety   . Lumbar radiculopathy 03/09/2018  . Paraparesis ([place]) 03/09/2018  . Essential hypertension   . Chronic nonintractable headache   . Leukocytosis   . Acute blood loss anemia   . Postoperative pain   . Generalized weakness   . Spinal stenosis at L4-L5 level 03/04/2018  . Spinal stenosis of lumbar region 12/28/2015  . Chest pain 04/16/2015    [place], [place], [place] 02/07/2020, 8:09 PM  [facility] [address] [place], [name], [address] Phone: [phone]   Fax:  [phone]  Name: [name] MRN: [id number] Date of Birth: [birth year]

## 2020-02-07 NOTE — Telephone Encounter (Signed)
I called patient and let her know that I have sent her medication to [facility] and let her know that her prescription was sent but looked like it printed and was a mistake and gave her the receipt confirmation time also. Patient thanked me and verbalized an understanding.

## 2020-02-13 ENCOUNTER — Other Ambulatory Visit: Payer: Self-pay

## 2020-02-13 ENCOUNTER — Ambulatory Visit: Payer: BC Managed Care – PPO | Admitting: Physical Therapy

## 2020-02-13 DIAGNOSIS — R2689 Other abnormalities of gait and mobility: Secondary | ICD-10-CM

## 2020-02-13 DIAGNOSIS — M6281 Muscle weakness (generalized): Secondary | ICD-10-CM | POA: Diagnosis not present

## 2020-02-13 DIAGNOSIS — M5442 Lumbago with sciatica, left side: Secondary | ICD-10-CM | POA: Diagnosis not present

## 2020-02-13 DIAGNOSIS — G8929 Other chronic pain: Secondary | ICD-10-CM | POA: Diagnosis not present

## 2020-02-13 DIAGNOSIS — R29818 Other symptoms and signs involving the nervous system: Secondary | ICD-10-CM

## 2020-02-14 ENCOUNTER — Encounter: Payer: Self-pay | Admitting: Physical Therapy

## 2020-02-15 NOTE — Therapy (Signed)
[facility] [address] [place], [name], [address] Phone: [phone]   Fax:  [phone]  Physical Therapy Treatment  Patient Details  Name: [name] MRN: [id number] Date of Birth: [birth year] Referring Provider (PT): Danella Sensing, NP   Encounter Date: 02/13/2020  PT End of Session - 02/15/20 1050    Visit Number  3    Number of Visits  9    Date for PT Re-Evaluation  03/13/20    Authorization Type  BCBS    Authorization - Visit Number  3    PT Start Time  [id number]    PT Stop Time  1500    PT Time Calculation (min)  45 min    Equipment Utilized During Treatment  Other (comment)   pool noodle   Activity Tolerance  Patient tolerated treatment well    Behavior During Therapy  [facility] for tasks assessed/performed       Past Medical History:  Diagnosis Date  . Anxiety   . Back pain    related to spinal stenosis and disc problem, radiates down left buttocks to leg., weakness occ.  [name] Dyspnea   . Headache   . Hyperlipidemia   . Hypertension   . Lumbar post-laminectomy syndrome   . PONV (postoperative nausea and vomiting)   . Vaginal foreign object    "Uses Femring"    Past Surgical History:  Procedure Laterality Date  . ABDOMINAL HYSTERECTOMY    . CARDIAC CATHETERIZATION N/A 04/18/2015   Procedure: Left Heart Cath and Coronary Angiography;  Surgeon: [name], MD;  Location: [place] [facility];  Service: Cardiovascular;  Laterality: N/A;  . COLONOSCOPY W/ BIOPSIES AND POLYPECTOMY    . FOOT SURGERY Bilateral    [place] "bunion,bone spur, tendon" (1) -6'16, (1)-10'16  . IR EPIDUROGRAPHY  07/21/2018  . LUMBAR LAMINECTOMY/DECOMPRESSION MICRODISCECTOMY Bilateral 12/28/2015   Procedure: MICRO LUMBAR DECOMPRESSION L4 - L5 BILATERALLY;  Surgeon: [name], MD;  [facility];  Service: Orthopedics;  Laterality: Bilateral;  . LUMBAR LAMINECTOMY/DECOMPRESSION MICRODISCECTOMY Bilateral 03/04/2018    Procedure: Revision of Microlumbar Decompression Bilateral Lumbar Four-Five;  Surgeon: [name], MD;  Location: [place];  Service: Orthopedics;  Laterality: Bilateral;  90 mins  . SPINAL CORD STIMULATOR INSERTION N/A 09/28/2019   Procedure: THORACIC SPINAL CORD STIMULATOR INSERTION;  Surgeon: Melina Schools, MD;  Location: [facility];  Service: Orthopedics;  Laterality: N/A;  2.5 hrs  . TUBAL LIGATION    . WISDOM TOOTH EXTRACTION    . WOUND EXPLORATION N/A 03/04/2018   Procedure: EXPLORATION OF LUMBAR DECOMPRESSION WOUND;  Surgeon: [name], MD;  Location: [facility];  Service: Orthopedics;  Laterality: N/A;    There were no vitals filed for this visit.  Subjective Assessment - 02/14/20 1305    Subjective  Pt reports Dr. Rolena Infante called to see how she was doing - pt continues to c/o burning/stinging sensation at site of pain stimulator implant; pt reports the water therapy helped alot last week - did not feel much pain when she was in the pool    Pertinent History  decompression and laminectomy on 12/28/15 at L4-L5  with redo decompression surgery on May 9,2019, post-op paraparesis with surgery to remove small hematoma; 07/12/18 fall at work (involved rolling chair) with hospital admission, transfer to [facility] with d/c home 08/03/18; HTN, Bil foot surgery     Patient Stated Goals  improve leg strength and balance through aquatic therapy    Currently in Pain?  Yes    Pain Score  6     Pain Location  Back    Pain Orientation  Lower;Left    Pain Descriptors / Indicators  Burning;Pins and needles    Pain Type  Neuropathic pain    Pain Onset  More than a month ago    Pain Frequency  Constant            Aquatic therapy at [facility] - pool temp 87.4 degrees   Patient seen for aquatic therapy today.  Treatment took place in water 3.5-4 feet deep depending upon activity.  Pt entered and  Exited the pool via ramp negotiation with use of bilateral hand rails with supervision.  Pt performed runner's stretch  on each leg and heel cord stretch bil. LE's with feet on pool wall - 30 sec hold each stretch x 1 rep Pt performed amb. 79m across pool 4 reps forward;  sideways 1 rep with use of noodle for UE support with therapist holding noodle for stability   Pt performed standing hip flexion, abduction and hip extension with knee extended -  10 reps each leg with UE support  Marching in place 10 reps each leg with UE support; marching 100m x 1 rep across pool with UE support on pool noodle  Squats x 10 reps with bil. UE support on pool edge Heel raises x 10 reps - bilateral LE's with UE support on pool wall   Pt requires aquatic therapy for the unweighting provided by the buoyancy of the water and needs the viscosity for strengthening LE's and trunk musc.; buoyancy of water needed for spinal decompression for increased tolerance for movement with less pain experienced than with land exercises                             PT Short Term Goals - 02/15/20 1053      PT SHORT TERM GOAL #1   Title  Pt will report decreased pain in low and Rt hip area after aquatic therapy to </= 3/10 for increased ease with mobility.    Baseline  4/10 on 01-12-20    Time  4    Period  Weeks    Status  New    Target Date  02/24/20   target date extended due to wait list for aquatic therapy     PT SHORT TERM GOAL #2   Title  Pt will report ability to amb. with 1 forearm crutch for 25' in home environment.    Time  4    Period  Weeks    Status  New    Target Date  02/24/20        PT Long Term Goals - 02/15/20 1054      PT LONG TERM GOAL #1   Title  Pt will be independent in HEP for aquatic exercises to be continued upon discharge from PT.    Time  8    Period  Weeks    Status  New      PT LONG TERM GOAL #2   Title  Pt will report 50% decrease in pain (</= 2/10 intensity) in low back/Rt hip region for increased comfort and ease with mobility and ADL's.    Baseline  4/10 intensity on  01-12-20    Time  8    Period  Weeks    Status  New  Plan - 02/15/20 1051    Clinical Impression Statement  Pt continues to demonstrate decreased Rt knee flexion in swing phase of gait due to extensor tone/spasticity and decreased Rt hamstring strength.  Pt needs cues to lift Rt leg and flex Rt knee in swing to decrease compensation of circumduction in swing through phase of gait.  Pt continues to require ues of noodle for UE support for balance with water walking.    Personal Factors and Comorbidities  Past/Current Experience;Comorbidity 2;Time since onset of injury/illness/exacerbation    Examination-Activity Limitations  Stand;Locomotion Level;Bend;Squat;Stairs;Transfers;Carry    Examination-Participation Restrictions  Cleaning;Community Activity;Meal Prep;Shop    Stability/Clinical Decision Making  Stable/Uncomplicated    Rehab Potential  Good    PT Frequency  1x / week    PT Duration  8 weeks    PT Treatment/Interventions  Manual techniques;Patient/family education;Therapeutic exercise;Therapeutic activities;Gait training;ADLs/Self Care Home Management;Neuromuscular re-education;Functional mobility training;Passive range of motion;Balance training;Aquatic Therapy;Biofeedback;DME Instruction;Stair training;Orthotic Fit/Training;Dry needling    PT Next Visit Plan  cont aquatic therapy for back pain and for improved mobility    Consulted and Agree with Plan of Care  Patient       Patient will benefit from skilled therapeutic intervention in order to improve the following deficits and impairments:  Pain, Postural dysfunction, Difficulty walking, Decreased strength, Decreased activity tolerance, Abnormal gait, Increased muscle spasms, Decreased endurance, Decreased balance, Decreased coordination, Decreased knowledge of use of DME, Decreased mobility, Decreased safety awareness, Impaired sensation, Impaired tone  Visit Diagnosis: Other abnormalities of gait and  mobility  Muscle weakness (generalized)  Other symptoms and signs involving the nervous system     Problem List Patient Active Problem List   Diagnosis Date Noted  . Chronic pain 09/28/2019  . Lumbar spinal stenosis   . Radicular pain   . Gait disorder   . Difficulty in urination 07/13/2018  . Back pain 07/12/2018  . Hyperlipidemia 04/16/2018  . Anxiety disorder, unspecified 04/16/2018  . AKI (acute kidney injury) ([place])   . Benign essential HTN   . Depression with anxiety   . Lumbar radiculopathy 03/09/2018  . Paraparesis ([place]) 03/09/2018  . Essential hypertension   . Chronic nonintractable headache   . Leukocytosis   . Acute blood loss anemia   . Postoperative pain   . Generalized weakness   . Spinal stenosis at L4-L5 level 03/04/2018  . Spinal stenosis of lumbar region 12/28/2015  . Chest pain 04/16/2015    Weylyn Ricciuti, [place], PT, [facility] 02/15/2020, 10:55 AM  [facility] [address] [place], [name], [address] Phone: [phone]   Fax:  [phone]  Name: [name] MRN: [id number] Date of Birth: [birth year]

## 2020-02-20 ENCOUNTER — Other Ambulatory Visit: Payer: Self-pay

## 2020-02-20 ENCOUNTER — Ambulatory Visit: Payer: BC Managed Care – PPO | Admitting: Physical Therapy

## 2020-02-20 DIAGNOSIS — M5442 Lumbago with sciatica, left side: Secondary | ICD-10-CM | POA: Diagnosis not present

## 2020-02-20 DIAGNOSIS — R2689 Other abnormalities of gait and mobility: Secondary | ICD-10-CM | POA: Diagnosis not present

## 2020-02-20 DIAGNOSIS — R29818 Other symptoms and signs involving the nervous system: Secondary | ICD-10-CM

## 2020-02-20 DIAGNOSIS — G8929 Other chronic pain: Secondary | ICD-10-CM | POA: Diagnosis not present

## 2020-02-20 DIAGNOSIS — M6281 Muscle weakness (generalized): Secondary | ICD-10-CM | POA: Diagnosis not present

## 2020-02-21 ENCOUNTER — Encounter: Payer: Self-pay | Admitting: Physical Therapy

## 2020-02-21 NOTE — Therapy (Signed)
[facility] [address] [place], [name], [address] Phone: [phone]   Fax:  [phone]  Physical Therapy Treatment  Patient Details  Name: [name] MRN: [id number] Date of Birth: [birth year] Referring Provider (PT): Danella Sensing, NP   Encounter Date: 02/20/2020  PT End of Session - 02/21/20 1742    Visit Number  4    Number of Visits  9    Date for PT Re-Evaluation  03/13/20    Authorization Type  BCBS    Authorization - Visit Number  4    PT Start Time  1500    PT Stop Time  1545    PT Time Calculation (min)  45 min    Equipment Utilized During Treatment  Other (comment)   pool noodle   Activity Tolerance  Patient tolerated treatment well    Behavior During Therapy  [facility] for tasks assessed/performed       Past Medical History:  Diagnosis Date  . Anxiety   . Back pain    related to spinal stenosis and disc problem, radiates down left buttocks to leg., weakness occ.  [name] Dyspnea   . Headache   . Hyperlipidemia   . Hypertension   . Lumbar post-laminectomy syndrome   . PONV (postoperative nausea and vomiting)   . Vaginal foreign object    "Uses Femring"    Past Surgical History:  Procedure Laterality Date  . ABDOMINAL HYSTERECTOMY    . CARDIAC CATHETERIZATION N/A 04/18/2015   Procedure: Left Heart Cath and Coronary Angiography;  Surgeon: [name], MD;  Location: [place] [facility];  Service: Cardiovascular;  Laterality: N/A;  . COLONOSCOPY W/ BIOPSIES AND POLYPECTOMY    . FOOT SURGERY Bilateral    [place] "bunion,bone spur, tendon" (1) -6'16, (1)-10'16  . IR EPIDUROGRAPHY  07/21/2018  . LUMBAR LAMINECTOMY/DECOMPRESSION MICRODISCECTOMY Bilateral 12/28/2015   Procedure: MICRO LUMBAR DECOMPRESSION L4 - L5 BILATERALLY;  Surgeon: [name], MD;  [facility];  Service: Orthopedics;  Laterality: Bilateral;  . LUMBAR LAMINECTOMY/DECOMPRESSION MICRODISCECTOMY Bilateral 03/04/2018    Procedure: Revision of Microlumbar Decompression Bilateral Lumbar Four-Five;  Surgeon: [name], MD;  Location: [facility];  Service: Orthopedics;  Laterality: Bilateral;  90 mins  . SPINAL CORD STIMULATOR INSERTION N/A 09/28/2019   Procedure: THORACIC SPINAL CORD STIMULATOR INSERTION;  Surgeon: Melina Schools, MD;  Location: [facility];  Service: Orthopedics;  Laterality: N/A;  2.5 hrs  . TUBAL LIGATION    . WISDOM TOOTH EXTRACTION    . WOUND EXPLORATION N/A 03/04/2018   Procedure: EXPLORATION OF LUMBAR DECOMPRESSION WOUND;  Surgeon: [name], MD;  Location: [place];  Service: Orthopedics;  Laterality: N/A;    There were no vitals filed for this visit.  Subjective Assessment - 02/21/20 1738    Subjective  Pt reports she continues to have stinging sensation where the battery of the pain stimulator implant is located in her Lt hip    Pertinent History  decompression and laminectomy on 12/28/15 at L4-L5  with redo decompression surgery on May 9,2019, post-op paraparesis with surgery to remove small hematoma; 07/12/18 fall at work (involved rolling chair) with hospital admission, transfer to [place] with d/c home 08/03/18; HTN, Bil foot surgery     Patient Stated Goals  improve leg strength and balance through aquatic therapy    Currently in Pain?  Yes    Pain Score  7     Pain Location  Hip    Pain Orientation  Left  Pain Descriptors / Indicators  Burning;Tingling    Pain Type  Chronic pain    Pain Onset  More than a month ago    Pain Frequency  Constant          Aquatic therapy at [facility]  Patient seen for aquatic therapy today.  Treatment took place in water 3.5-4 feet deep depending upon activity.  Pt entered and  Exited the pool via ramp negotiation with use of bilateral hand rails with supervision for safety.  Pt performed runner's stretch on each leg and heel cord stretch bil. LE's with feet on pool wall - 30 sec hold each stretch x 1 rep Pt performed amb. 55m across pool 6 reps forward;   sideways 1 rep with use of noodle for UE support with therapist holding noodle for stability   Pt performed standing hip flexion, abduction and hip extension with knee extended -  15 reps each leg with UE support  Marching in place 10 reps each leg with UE support;   Squats x 10 reps with bil. UE support on pool edge Heel raises x 10 reps - bilateral LE's with UE support on pool wall   Pt requires aquatic therapy for the unweighting provided by the buoyancy of the water and needs the viscosity for strengthening LE's and trunk musc.; buoyancy of water needed for spinal decompression for increased tolerance for movement with less pain experienced than with land exercises                            PT Short Term Goals - 02/21/20 1747      PT SHORT TERM GOAL #1   Title  Pt will report decreased pain in low and Rt hip area after aquatic therapy to </= 3/10 for increased ease with mobility.    Baseline  4/10 on 01-12-20    Time  4    Period  Weeks    Status  New    Target Date  02/24/20   target date extended due to wait list for aquatic therapy     PT SHORT TERM GOAL #2   Title  Pt will report ability to amb. with 1 forearm crutch for 25' in home environment.    Time  4    Period  Weeks    Status  New    Target Date  02/24/20        PT Long Term Goals - 02/21/20 1748      PT LONG TERM GOAL #1   Title  Pt will be independent in HEP for aquatic exercises to be continued upon discharge from PT.    Time  8    Period  Weeks    Status  New      PT LONG TERM GOAL #2   Title  Pt will report 50% decrease in pain (</= 2/10 intensity) in low back/Rt hip region for increased comfort and ease with mobility and ADL's.    Baseline  4/10 intensity on 01-12-20    Time  8    Period  Weeks    Status  New            Plan - 02/21/20 1742    Clinical Impression Statement  Pt continues to have spasticity in bil. LE's with tone in RLE worse than LLE; pt continues to  have minimal isolated Rt knee flexion in swing phase of gait due to extensor tone.  Pt  reporting significant burning and stinging sensation in Lt hip where battery is implanted - pt requested to hold on using buoyant ankle cuffs for hip strengthening exercises due to severity of burning sensation in Lt hip.    Personal Factors and Comorbidities  Past/Current Experience;Comorbidity 2;Time since onset of injury/illness/exacerbation    Examination-Activity Limitations  Stand;Locomotion Level;Bend;Squat;Stairs;Transfers;Carry    Examination-Participation Restrictions  Cleaning;Community Activity;Meal Prep;Shop    Stability/Clinical Decision Making  Stable/Uncomplicated    Rehab Potential  Good    PT Frequency  1x / week    PT Duration  8 weeks    PT Treatment/Interventions  Manual techniques;Patient/family education;Therapeutic exercise;Therapeutic activities;Gait training;ADLs/Self Care Home Management;Neuromuscular re-education;Functional mobility training;Passive range of motion;Balance training;Aquatic Therapy;Biofeedback;DME Instruction;Stair training;Orthotic Fit/Training;Dry needling    PT Next Visit Plan  cont aquatic therapy for back pain and for improved mobility    Consulted and Agree with Plan of Care  Patient       Patient will benefit from skilled therapeutic intervention in order to improve the following deficits and impairments:  Pain, Postural dysfunction, Difficulty walking, Decreased strength, Decreased activity tolerance, Abnormal gait, Increased muscle spasms, Decreased endurance, Decreased balance, Decreased coordination, Decreased knowledge of use of DME, Decreased mobility, Decreased safety awareness, Impaired sensation, Impaired tone  Visit Diagnosis: Other abnormalities of gait and mobility  Muscle weakness (generalized)  Other symptoms and signs involving the nervous system     Problem List Patient Active Problem List   Diagnosis Date Noted  . Chronic pain  09/28/2019  . Lumbar spinal stenosis   . Radicular pain   . Gait disorder   . Difficulty in urination 07/13/2018  . Back pain 07/12/2018  . Hyperlipidemia 04/16/2018  . Anxiety disorder, unspecified 04/16/2018  . AKI (acute kidney injury) ([place])   . Benign essential HTN   . Depression with anxiety   . Lumbar radiculopathy 03/09/2018  . Paraparesis ([place]) 03/09/2018  . Essential hypertension   . Chronic nonintractable headache   . Leukocytosis   . Acute blood loss anemia   . Postoperative pain   . Generalized weakness   . Spinal stenosis at L4-L5 level 03/04/2018  . Spinal stenosis of lumbar region 12/28/2015  . Chest pain 04/16/2015    Adelae Yodice, [place], PT, [place] 02/21/2020, 5:50 PM  [facility] [address] [place], [name], [address] Phone: [phone]   Fax:  [phone]  Name: MAKYNZI BARDWELL MRN: [id number] Date of Birth: 08/12/1961

## 2020-02-22 ENCOUNTER — Telehealth: Payer: Self-pay | Admitting: Family Medicine

## 2020-02-22 MED FILL — FEMRING 0.05 MG/24HR RING: 0.05 | 90 days supply | Qty: 1 | Fill #1

## 2020-02-22 MED FILL — CARVEDILOL 6.25 MG TABLET: 6.25 | 90 days supply | Qty: 180 | Fill #0

## 2020-02-22 NOTE — Telephone Encounter (Signed)
Rx sent to the pharmacy.

## 2020-02-22 NOTE — Telephone Encounter (Signed)
Pt states she only has a few pills left.   Medication Refill: Carvedilol Pharmacy: [name] FAX: [phone]

## 2020-02-27 ENCOUNTER — Other Ambulatory Visit: Payer: Self-pay

## 2020-02-27 ENCOUNTER — Ambulatory Visit: Payer: BC Managed Care – PPO | Attending: Registered Nurse | Admitting: Physical Therapy

## 2020-02-27 DIAGNOSIS — R2689 Other abnormalities of gait and mobility: Secondary | ICD-10-CM | POA: Diagnosis not present

## 2020-02-27 DIAGNOSIS — R2681 Unsteadiness on feet: Secondary | ICD-10-CM | POA: Insufficient documentation

## 2020-02-27 DIAGNOSIS — G8929 Other chronic pain: Secondary | ICD-10-CM | POA: Diagnosis not present

## 2020-02-27 DIAGNOSIS — M6281 Muscle weakness (generalized): Secondary | ICD-10-CM | POA: Insufficient documentation

## 2020-02-27 DIAGNOSIS — M5442 Lumbago with sciatica, left side: Secondary | ICD-10-CM | POA: Diagnosis not present

## 2020-02-28 NOTE — Therapy (Signed)
[facility] [address] [place], [name], [address] Phone: [phone]   Fax:  [phone]  Physical Therapy Treatment  Patient Details  Name: [name] MRN: [id number] Date of Birth: 04-Nov-1960 Referring Provider (PT): Danella Sensing, NP   Encounter Date: 02/27/2020  PT End of Session - 02/28/20 2015    Visit Number  5    Number of Visits  9    Date for PT Re-Evaluation  03/13/20    Authorization Type  BCBS    Authorization - Visit Number  5    PT Start Time  [id number]    PT Stop Time  1550    PT Time Calculation (min)  45 min    Equipment Utilized During Treatment  Other (comment)   bar bells, pool noodle, ankle cuffs   Activity Tolerance  Patient tolerated treatment well    Behavior During Therapy  [facility] for tasks assessed/performed       Past Medical History:  Diagnosis Date  . Anxiety   . Back pain    related to spinal stenosis and disc problem, radiates down left buttocks to leg., weakness occ.  [name] Dyspnea   . Headache   . Hyperlipidemia   . Hypertension   . Lumbar post-laminectomy syndrome   . PONV (postoperative nausea and vomiting)   . Vaginal foreign object    "Uses Femring"    Past Surgical History:  Procedure Laterality Date  . ABDOMINAL HYSTERECTOMY    . CARDIAC CATHETERIZATION N/A 04/18/2015   Procedure: Left Heart Cath and Coronary Angiography;  Surgeon: [name], MD;  Location: [facility];  Service: Cardiovascular;  Laterality: N/A;  . COLONOSCOPY W/ BIOPSIES AND POLYPECTOMY    . FOOT SURGERY Bilateral    [place] "bunion,bone spur, tendon" (1) -6'16, (1)-10'16  . IR EPIDUROGRAPHY  07/21/2018  . LUMBAR LAMINECTOMY/DECOMPRESSION MICRODISCECTOMY Bilateral 12/28/2015   Procedure: MICRO LUMBAR DECOMPRESSION L4 - L5 BILATERALLY;  Surgeon: [name], MD;  Location: [facility];  Service: Orthopedics;  Laterality: Bilateral;  . LUMBAR LAMINECTOMY/DECOMPRESSION  MICRODISCECTOMY Bilateral 03/04/2018   Procedure: Revision of Microlumbar Decompression Bilateral Lumbar Four-Five;  Surgeon: [name], MD;  Location: [facility];  Service: Orthopedics;  Laterality: Bilateral;  90 mins  . SPINAL CORD STIMULATOR INSERTION N/A 09/28/2019   Procedure: THORACIC SPINAL CORD STIMULATOR INSERTION;  Surgeon: Melina Schools, MD;  Location: [facility];  Service: Orthopedics;  Laterality: N/A;  2.5 hrs  . TUBAL LIGATION    . WISDOM TOOTH EXTRACTION    . WOUND EXPLORATION N/A 03/04/2018   Procedure: EXPLORATION OF LUMBAR DECOMPRESSION WOUND;  Surgeon: [name], MD;  Location: [facility];  Service: Orthopedics;  Laterality: N/A;    There were no vitals filed for this visit.  Subjective Assessment - 02/28/20 1958    Subjective  Pt states she has MD appt on Friday with Dr. Rolena Infante to discuss options for moving battery in pain stimulator implant due to severity of the burning sensation which persists    Pertinent History  decompression and laminectomy on 12/28/15 at L4-L5  with redo decompression surgery on May 9,2019, post-op paraparesis with surgery to remove small hematoma; 07/12/18 fall at work (involved rolling chair) with hospital admission, transfer to [place] with d/c home 08/03/18; HTN, Bil foot surgery     Patient Stated Goals  improve leg strength and balance through aquatic therapy    Currently in Pain?  Yes    Pain Score  7  Pain Location  Hip    Pain Orientation  Left    Pain Descriptors / Indicators  Burning    Pain Type  Surgical pain;Other (Comment)   due to battery in pain stimulator implant in Lt hip   Pain Onset  More than a month ago    Pain Frequency  Constant    Multiple Pain Sites  No             Aquatic therapy at [facility] - pool temp 87.4 degrees   Patient seen for aquatic therapy today.  Treatment took place in water 3.5-4 feet deep depending upon activity.  Pt entered and  Exited the pool via ramp negotiation with use of bilateral hand rails with  supervision.  Pt performed runner's stretch on each leg and heel cord stretch bil. LE's with feet on pool wall - 30 sec hold each stretch x 1 rep Pt performed amb. 29m across pool 3 reps forward;  sideways 1 rep with use of noodle for UE support with therapist holding noodle for stability  Pt performed forwards amb. With use of bar bells for reciprocal arm movement with CGA - 81m x 2 reps with CGA  Pt performed standing hip flexion, abduction and hip extension with knee extended -  10 reps each leg with UE support with use of ankle cuffs for increased resistance for strengthening Marching in place 10 reps each leg with UE support  Squats x 10 reps with bil. UE support on pool edge Heel raises x 10 reps - bilateral LE's with UE support on pool wall   Pt requires aquatic therapy for the unweighting provided by the buoyancy of the water and needs the viscosity for strengthening LE's and trunk musc.; buoyancy of water needed for spinal decompression for increased tolerance for movement with less pain experienced than with land exercises                                 PT Short Term Goals - 02/28/20 2021      PT SHORT TERM GOAL #1   Title  Pt will report decreased pain in low and Rt hip area after aquatic therapy to </= 3/10 for increased ease with mobility.    Baseline  4/10 on 01-12-20    Time  4    Period  Weeks    Status  New    Target Date  02/24/20   target date extended due to wait list for aquatic therapy     PT SHORT TERM GOAL #2   Title  Pt will report ability to amb. with 1 forearm crutch for 25' in home environment.    Time  4    Period  Weeks    Status  New    Target Date  02/24/20        PT Long Term Goals - 02/28/20 2022      PT LONG TERM GOAL #1   Title  Pt will be independent in HEP for aquatic exercises to be continued upon discharge from PT.    Time  8    Period  Weeks    Status  New      PT LONG TERM GOAL #2   Title  Pt will  report 50% decrease in pain (</= 2/10 intensity) in low back/Rt hip region for increased comfort and ease with mobility and ADL's.    Baseline  4/10 intensity on  01-12-20    Time  8    Period  Weeks    Status  New            Plan - 02/28/20 2018    Clinical Impression Statement  Pt reported she felt the stinging sensation in her Lt hip during the pool exercises for the first time today - pt states the burning sensation has been alleviated while she has been in the water until today's session - pt states she feels the burning sensation is getting worse.  Pt did tolerate using ankle cuffs for increased resistance  with the eccentric exercises today for LE strengthening for first time today.    Personal Factors and Comorbidities  Past/Current Experience;Comorbidity 2;Time since onset of injury/illness/exacerbation    Examination-Activity Limitations  Stand;Locomotion Level;Bend;Squat;Stairs;Transfers;Carry    Examination-Participation Restrictions  Cleaning;Community Activity;Meal Prep;Shop    Stability/Clinical Decision Making  Stable/Uncomplicated    Rehab Potential  Good    PT Frequency  1x / week    PT Duration  8 weeks    PT Treatment/Interventions  Manual techniques;Patient/family education;Therapeutic exercise;Therapeutic activities;Gait training;ADLs/Self Care Home Management;Neuromuscular re-education;Functional mobility training;Passive range of motion;Balance training;Aquatic Therapy;Biofeedback;DME Instruction;Stair training;Orthotic Fit/Training;Dry needling    PT Next Visit Plan  cont aquatic therapy for back pain and for improved mobility    Consulted and Agree with Plan of Care  Patient       Patient will benefit from skilled therapeutic intervention in order to improve the following deficits and impairments:  Pain, Postural dysfunction, Difficulty walking, Decreased strength, Decreased activity tolerance, Abnormal gait, Increased muscle spasms, Decreased endurance, Decreased  balance, Decreased coordination, Decreased knowledge of use of DME, Decreased mobility, Decreased safety awareness, Impaired sensation, Impaired tone  Visit Diagnosis: Other abnormalities of gait and mobility  Muscle weakness (generalized)  Chronic left-sided low back pain with left-sided sciatica     Problem List Patient Active Problem List   Diagnosis Date Noted  . Chronic pain 09/28/2019  . Lumbar spinal stenosis   . Radicular pain   . Gait disorder   . Difficulty in urination 07/13/2018  . Back pain 07/12/2018  . Hyperlipidemia 04/16/2018  . Anxiety disorder, unspecified 04/16/2018  . AKI (acute kidney injury) ([place])   . Benign essential HTN   . Depression with anxiety   . Lumbar radiculopathy 03/09/2018  . Paraparesis ([place]) 03/09/2018  . Essential hypertension   . Chronic nonintractable headache   . Leukocytosis   . Acute blood loss anemia   . Postoperative pain   . Generalized weakness   . Spinal stenosis at L4-L5 level 03/04/2018  . Spinal stenosis of lumbar region 12/28/2015  . Chest pain 04/16/2015    Darryle Dennie, [place], [place], [place] 02/28/2020, 8:24 PM  [facility] [address] [place], [name], [address] Phone: [phone]   Fax:  [phone]  Name: RALYNN MCGINNITY MRN: [id number] Date of Birth: 28-Aug-1961

## 2020-03-01 ENCOUNTER — Telehealth: Payer: Self-pay | Admitting: Family Medicine

## 2020-03-01 DIAGNOSIS — G8918 Other acute postprocedural pain: Secondary | ICD-10-CM | POA: Diagnosis not present

## 2020-03-01 MED FILL — GABAPENTIN 400 MG CAPSULE: 400 | 30 days supply | Qty: 90 | Fill #1

## 2020-03-01 NOTE — Telephone Encounter (Signed)
Pt came in and dropped off a [facility] [employer] Clear[employer] form to be completed by the provider.  Upon completion patient would like to have the form faxed to [phone].  Form placed in providers folder for completion.  Pt is aware that the provider is not in the office today but will receive the form on tomorrow 03/02/2020.

## 2020-03-02 ENCOUNTER — Other Ambulatory Visit: Payer: Self-pay

## 2020-03-02 ENCOUNTER — Telehealth (INDEPENDENT_AMBULATORY_CARE_PROVIDER_SITE_OTHER): Payer: BC Managed Care – PPO | Admitting: Internal Medicine

## 2020-03-02 DIAGNOSIS — J302 Other seasonal allergic rhinitis: Secondary | ICD-10-CM

## 2020-03-02 MED ORDER — BENZONATATE 200 MG PO CAPS: 200.0000 mg | ORAL_CAPSULE | Freq: Two times a day (BID) | ORAL | 0 refills | Status: DC | PRN

## 2020-03-02 MED FILL — BENZONATATE 200 MG CAP: 200 | 15 days supply | Qty: 30 | Fill #0

## 2020-03-02 NOTE — Telephone Encounter (Signed)
Form given to PCP for completion.

## 2020-03-02 NOTE — Progress Notes (Signed)
Virtual Visit via Telephone Note  I connected with [name] on 03/02/20 at  3:30 PM EDT by telephone and verified that I am speaking with the correct person using two identifiers.   I discussed the limitations, risks, security and privacy concerns of performing an evaluation and management service by telephone and the availability of in person appointments. I also discussed with the patient that there may be a patient responsible charge related to this service. The patient expressed understanding and agreed to proceed.  Location patient: home Location provider: work office Participants present for the call: patient, provider Patient did not have a visit in the prior 7 days to address this/these issue(s).   History of Present Illness:  For about 2-3 weeks has been having really bad "allergies". Post-nasal drip is significant at bedtime with cough that is causing sleep disturbance. Also rhinorrhea, sore, scratchy throat and watery eyes. No fever, no SOB, no pain with deep inspiration.   Observations/Objective: Patient sounds cheerful and well on the phone. I do not appreciate any increased work of breathing. Speech and thought processing are grossly intact. Patient reported vitals: none reported   Current Outpatient Medications:  .  ALPRAZolam (XANAX) 0.25 MG tablet, TAKE 1 TABLET BY MOUTH ONCE DAILY AS NEEDED FOR ANXIETY OR SLEEP, Disp: 30 tablet, Rfl: 0 .  atorvastatin (LIPITOR) 20 MG tablet, Take 1 tablet (20 mg total) by mouth daily., Disp: 90 tablet, Rfl: 0 .  carvedilol (COREG) 6.25 MG tablet, TAKE 1 TABLET (6.25 MG TOTAL) BY MOUTH 2 TIMES DAILY. CONTACT PCP FOR FURTHER FILLS, Disp: 180 tablet, Rfl: 0 .  diphenhydrAMINE (BENADRYL) 25 MG tablet, Take 25 mg by mouth daily as needed for allergies., Disp: , Rfl:  .  gabapentin (NEURONTIN) 300 MG capsule, Take 300 mg by mouth 4 (four) times daily., Disp: , Rfl:  .  ondansetron (ZOFRAN) 4 MG tablet, Take 1 tablet (4 mg  total) by mouth every 8 (eight) hours as needed for nausea or vomiting., Disp: 20 tablet, Rfl: 0 .  polyethylene glycol (MIRALAX / GLYCOLAX) packet, Take 17 g by mouth daily. (Patient taking differently: Take 17 g by mouth daily as needed for mild constipation. ), Disp: 30 each, Rfl: 0 .  traMADol (ULTRAM) 50 MG tablet, Take by mouth every 6 (six) hours as needed. Dr. Nelva Bush prescribing, Disp: , Rfl:  .  benzonatate (TESSALON) 200 MG capsule, Take 1 capsule (200 mg total) by mouth 2 (two) times daily as needed for cough., Disp: 30 capsule, Rfl: 0  Review of Systems:  Constitutional: Denies fever, chills, diaphoresis, appetite change and fatigue.  HEENT: Denies photophobia, eye pain,  hearing loss, ear pain,  mouth sores, trouble swallowing, neck pain, neck stiffness and tinnitus.   Respiratory: Denies SOB, DOE, cough, chest tightness,  and wheezing.   Cardiovascular: Denies chest pain, palpitations and leg swelling.  Gastrointestinal: Denies nausea, vomiting, abdominal pain, diarrhea, constipation, blood in stool and abdominal distention.  Genitourinary: Denies dysuria, urgency, frequency, hematuria, flank pain and difficulty urinating.  Endocrine: Denies: hot or cold intolerance, sweats, changes in hair or nails, polyuria, polydipsia. Musculoskeletal: Denies myalgias, back pain, joint swelling, arthralgias and gait problem.  Skin: Denies pallor, rash and wound.  Neurological: Denies dizziness, seizures, syncope, weakness, light-headedness, numbness and headaches.  Hematological: Denies adenopathy. Easy bruising, personal or family bleeding history  Psychiatric/Behavioral: Denies suicidal ideation, mood changes, confusion, nervousness, sleep disturbance and agitation   Assessment and Plan:  Seasonal allergies  -No concerning symptoms  at this time. -Advised antihistamines, decongestants, mucinex and will prescribe tessalon perles for cough. -RTC in 10-14 days if no improvement.    I  discussed the assessment and treatment plan with the patient. The patient was provided an opportunity to ask questions and all were answered. The patient agreed with the plan and demonstrated an understanding of the instructions.   The patient was advised to call back or seek an in-person evaluation if the symptoms worsen or if the condition fails to improve as anticipated.  I provided 22 minutes of non-face-to-face time during this encounter.   [name], MD [facility]

## 2020-03-05 ENCOUNTER — Other Ambulatory Visit: Payer: Self-pay

## 2020-03-05 ENCOUNTER — Ambulatory Visit: Payer: BC Managed Care – PPO | Admitting: Physical Therapy

## 2020-03-05 DIAGNOSIS — M5442 Lumbago with sciatica, left side: Secondary | ICD-10-CM | POA: Diagnosis not present

## 2020-03-05 DIAGNOSIS — M6281 Muscle weakness (generalized): Secondary | ICD-10-CM

## 2020-03-05 DIAGNOSIS — R2681 Unsteadiness on feet: Secondary | ICD-10-CM

## 2020-03-05 DIAGNOSIS — G8929 Other chronic pain: Secondary | ICD-10-CM | POA: Diagnosis not present

## 2020-03-05 DIAGNOSIS — R2689 Other abnormalities of gait and mobility: Secondary | ICD-10-CM | POA: Diagnosis not present

## 2020-03-06 ENCOUNTER — Telehealth: Payer: Self-pay | Admitting: Family Medicine

## 2020-03-06 ENCOUNTER — Encounter: Payer: Self-pay | Admitting: Physical Therapy

## 2020-03-06 NOTE — Telephone Encounter (Signed)
Message sent to PCP for review and approval. °

## 2020-03-06 NOTE — Telephone Encounter (Signed)
Pt was prescribed Benzonatate 200 mg on 03/02/20. Per pt, that is not working because she is not sleeping and her allergies are worse. Pt is requesting a cough syrup be called in to the pharmacy. Per pt, she has been prescribed cough syrup and it helps with her allergies. Pt uses [facility]. Thanks

## 2020-03-06 NOTE — Therapy (Signed)
[facility] [address] [place], [name], [address] Phone: [phone]   Fax:  [phone]  Physical Therapy Treatment  Patient Details  Name: [name] MRN: [id number] Date of Birth: 12-30-1960 Referring Provider (PT): Danella Sensing, NP   Encounter Date: 03/05/2020  PT End of Session - 03/06/20 2149    Visit Number  6    Number of Visits  9    Date for PT Re-Evaluation  03/13/20    Authorization Type  BCBS    Authorization - Visit Number  6    PT Start Time  [id number]    PT Stop Time  1550    PT Time Calculation (min)  45 min    Equipment Utilized During Treatment  Other (comment)   bar bells, pool noodle, ankle cuffs   Activity Tolerance  Patient tolerated treatment well    Behavior During Therapy  [facility] for tasks assessed/performed       Past Medical History:  Diagnosis Date  . Anxiety   . Back pain    related to spinal stenosis and disc problem, radiates down left buttocks to leg., weakness occ.  [name] Dyspnea   . Headache   . Hyperlipidemia   . Hypertension   . Lumbar post-laminectomy syndrome   . PONV (postoperative nausea and vomiting)   . Vaginal foreign object    "Uses Femring"    Past Surgical History:  Procedure Laterality Date  . ABDOMINAL HYSTERECTOMY    . CARDIAC CATHETERIZATION N/A 04/18/2015   Procedure: Left Heart Cath and Coronary Angiography;  Surgeon: [name], MD;  Location: [facility];  Service: Cardiovascular;  Laterality: N/A;  . COLONOSCOPY W/ BIOPSIES AND POLYPECTOMY    . FOOT SURGERY Bilateral    [place] "bunion,bone spur, tendon" (1) -6'16, (1)-10'16  . IR EPIDUROGRAPHY  07/21/2018  . LUMBAR LAMINECTOMY/DECOMPRESSION MICRODISCECTOMY Bilateral 12/28/2015   Procedure: MICRO LUMBAR DECOMPRESSION L4 - L5 BILATERALLY;  Surgeon: [name], MD;  [facility];  Service: Orthopedics;  Laterality: Bilateral;  . LUMBAR LAMINECTOMY/DECOMPRESSION  MICRODISCECTOMY Bilateral 03/04/2018   Procedure: Revision of Microlumbar Decompression Bilateral Lumbar Four-Five;  Surgeon: [name], MD;  Location: [facility];  Service: Orthopedics;  Laterality: Bilateral;  90 mins  . SPINAL CORD STIMULATOR INSERTION N/A 09/28/2019   Procedure: THORACIC SPINAL CORD STIMULATOR INSERTION;  Surgeon: Melina Schools, MD;  Location: [facility];  Service: Orthopedics;  Laterality: N/A;  2.5 hrs  . TUBAL LIGATION    . WISDOM TOOTH EXTRACTION    . WOUND EXPLORATION N/A 03/04/2018   Procedure: EXPLORATION OF LUMBAR DECOMPRESSION WOUND;  Surgeon: [name], MD;  Location: [facility];  Service: Orthopedics;  Laterality: N/A;    There were no vitals filed for this visit.  Subjective Assessment - 03/06/20 2146    Subjective  Aquatic therapy at [facility] -- Pt states she is scheduled to have surgery on 04-02-20 to move the battery of pain stimulator implant from Lt hip to Rt hip    Pertinent History  decompression and laminectomy on 12/28/15 at L4-L5  with redo decompression surgery on May 9,2019, post-op paraparesis with surgery to remove small hematoma; 07/12/18 fall at work (involved rolling chair) with hospital admission, transfer to [place] with d/c home 08/03/18; HTN, Bil foot surgery     Patient Stated Goals  improve leg strength and balance through aquatic therapy    Currently in Pain?  Yes    Pain Score  7  Pain Location  Hip    Pain Orientation  Left    Pain Descriptors / Indicators  Burning    Pain Type  Surgical pain    Pain Onset  More than a month ago    Pain Frequency  Constant            Aquatic therapy at [facility] - pool temp 87.4 degrees  Patient seen for aquatic therapy today.  Treatment took place in water 3.5-4 feet deep depending upon activity.  Pt entered and  Exited the pool via ramp negotiation with use of bilateral hand rails with supervision for safety.  Pt performed runner's stretch on each leg and heel cord stretch bil. LE's with feet on pool wall - 30  sec hold each stretch x 1 rep Pt performed amb. 53m across pool 4 reps forward;  sideways 2 reps with use of noodle for UE support with therapist holding noodle for stability   Pt performed standing hip flexion, abduction and hip extension with knee extended with use of ankle buoyant cuffs for resistance for strengthening - 10 reps each leg with UE support on pool wall Marching in place 10 reps each leg with UE support with ankle   Squats x 10 reps with bil. UE support on pool edge Heel raises x 10 reps - bilateral LE's with UE support on pool wall   Pt requires aquatic therapy for the unweighting provided by the buoyancy of the water and needs the viscosity for strengthening LE's and trunk musc.; buoyancy of water needed for spinal decompression for increased tolerance for movement with less pain experienced than with land exercises                         PT Short Term Goals - 03/06/20 2157      PT SHORT TERM GOAL #1   Title  Pt will report decreased pain in low and Rt hip area after aquatic therapy to </= 3/10 for increased ease with mobility.    Baseline  4/10 on 01-12-20    Time  4    Period  Weeks    Status  New    Target Date  02/24/20   target date extended due to wait list for aquatic therapy     PT SHORT TERM GOAL #2   Title  Pt will report ability to amb. with 1 forearm crutch for 25' in home environment.    Time  4    Period  Weeks    Status  New    Target Date  02/24/20        PT Long Term Goals - 03/06/20 2157      PT LONG TERM GOAL #1   Title  Pt will be independent in HEP for aquatic exercises to be continued upon discharge from PT.    Time  8    Period  Weeks    Status  New      PT LONG TERM GOAL #2   Title  Pt will report 50% decrease in pain (</= 2/10 intensity) in low back/Rt hip region for increased comfort and ease with mobility and ADL's.    Baseline  4/10 intensity on 01-12-20    Time  8    Period  Weeks    Status  New             Plan - 03/06/20 2150    Clinical Impression Statement  Pt continues to have  spasticity in RLE with pt demonstrating minimal Rt knee flexion in swing phase of gait.  Pt tolerated strengthening exercises for LE's well, with pt using aquatic cuffs for strengthening; pt continues to require bil. UE support on pool noodle (stabilized by PT) for assistance with balance for water walking.    Personal Factors and Comorbidities  Past/Current Experience;Comorbidity 2;Time since onset of injury/illness/exacerbation    Examination-Activity Limitations  Stand;Locomotion Level;Bend;Squat;Stairs;Transfers;Carry    Examination-Participation Restrictions  Cleaning;Community Activity;Meal Prep;Shop    Stability/Clinical Decision Making  Stable/Uncomplicated    Rehab Potential  Good    PT Frequency  1x / week    PT Duration  8 weeks    PT Treatment/Interventions  Manual techniques;Patient/family education;Therapeutic exercise;Therapeutic activities;Gait training;ADLs/Self Care Home Management;Neuromuscular re-education;Functional mobility training;Passive range of motion;Balance training;Aquatic Therapy;Biofeedback;DME Instruction;Stair training;Orthotic Fit/Training;Dry needling    PT Next Visit Plan  cont aquatic therapy for back pain and for improved mobility    Consulted and Agree with Plan of Care  Patient       Patient will benefit from skilled therapeutic intervention in order to improve the following deficits and impairments:  Pain, Postural dysfunction, Difficulty walking, Decreased strength, Decreased activity tolerance, Abnormal gait, Increased muscle spasms, Decreased endurance, Decreased balance, Decreased coordination, Decreased knowledge of use of DME, Decreased mobility, Decreased safety awareness, Impaired sensation, Impaired tone  Visit Diagnosis: Muscle weakness (generalized)  Unsteadiness on feet  Other abnormalities of gait and mobility     Problem List Patient Active  Problem List   Diagnosis Date Noted  . Chronic pain 09/28/2019  . Lumbar spinal stenosis   . Radicular pain   . Gait disorder   . Difficulty in urination 07/13/2018  . Back pain 07/12/2018  . Hyperlipidemia 04/16/2018  . Anxiety disorder, unspecified 04/16/2018  . AKI (acute kidney injury) ([place])   . Benign essential HTN   . Depression with anxiety   . Lumbar radiculopathy 03/09/2018  . Paraparesis ([place]) 03/09/2018  . Essential hypertension   . Chronic nonintractable headache   . Leukocytosis   . Acute blood loss anemia   . Postoperative pain   . Generalized weakness   . Spinal stenosis at L4-L5 level 03/04/2018  . Spinal stenosis of lumbar region 12/28/2015  . Chest pain 04/16/2015    Daphane Odekirk, [place], [place], [place] 03/06/2020, 10:00 PM  [facility] [address] [place], [name], [address] Phone: [phone]   Fax:  [phone]  Name: DONI SUNDERLAND MRN: [id number] Date of Birth: 1961/08/21

## 2020-03-06 NOTE — Telephone Encounter (Signed)
Last seen in 08/2019. Cough can last for a few weeks after URI, can also be related with allergies. Most likely she was advised to follow if not better. Thanks, BJ

## 2020-03-07 ENCOUNTER — Other Ambulatory Visit: Payer: Self-pay

## 2020-03-07 ENCOUNTER — Encounter: Payer: Self-pay | Admitting: Family Medicine

## 2020-03-07 ENCOUNTER — Telehealth (INDEPENDENT_AMBULATORY_CARE_PROVIDER_SITE_OTHER): Payer: BC Managed Care – PPO | Admitting: Family Medicine

## 2020-03-07 VITALS — BP 120/84 | Ht 65.0 in

## 2020-03-07 DIAGNOSIS — R059 Cough, unspecified: Secondary | ICD-10-CM

## 2020-03-07 DIAGNOSIS — R05 Cough: Secondary | ICD-10-CM

## 2020-03-07 DIAGNOSIS — J302 Other seasonal allergic rhinitis: Secondary | ICD-10-CM

## 2020-03-07 MED ORDER — HYDROCODONE-HOMATROPINE 5-1.5 MG/5ML PO SYRP: 5.0000 mL | ORAL_SOLUTION | Freq: Two times a day (BID) | ORAL | 0 refills | Status: AC | PRN

## 2020-03-07 NOTE — Telephone Encounter (Signed)
Scheduled patient for a telephone visit this afternoon

## 2020-03-07 NOTE — Progress Notes (Signed)
Virtual Visit via Telephone Note  I connected with [name] on 03/07/2020 at  3:15 PM EDT by telephone and verified that I am speaking with the correct person using two identifiers.   I discussed the limitations, risks, security and privacy concerns of performing an evaluation and management service by telephone and the availability of in person appointments. I also discussed with the patient that there may be a patient responsible charge related to this service. The patient expressed understanding and agreed to proceed.  Location patient: home Location provider: work office Participants present for the call: patient, provider Patient did not have a visit in the prior 7 days to address this/these issue(s).  History of Present Illness: [name] is a 59 yo female with hx of allergies, hypertension,asthma,chronic back pain complaining about 2 weeks of persistent productive cough.  States that she has had similar problem before, attributed to allergies. Antihistaminic medications and intranasal steroid have not helped in the past.  Negative for hemoptysis. She is not able to cough sputum up. It is worse at night when lying down.Cough is interfering with his sleep.  She has not identified alleviating factors.  Initially she had postnasal drainage, nasal congestion, and rhinorrhea.  These symptoms have resolved. Negative for changes in the smell or taste.  Denies fever, chills, sore throat, change in appetite, CP, wheezing, or dyspnea. Negative for sick contact.  Problem seems to be stable. She was evaluated for same problem on 03/02/2020. Benzonatate 200 mg was prescribed, states that this medication has not helped."It was a waste of money."  She requesting a cough syrup she took years ago and "stopped" the cough.  She is planning on having surgery on 03/30/2020. Revision/relocation of spinal cord stimulator.  Observations/Objective: Patient sounds cheerful and well on the  phone. I do not appreciate any SOB.Cough x 1, no stridor or wheezing. Speech and thought processing are grossly intact.Upset. Patient reported vitals:BP 120/84   Ht 5\' 5"  (1.651 m)   BMI 29.99 kg/m   Assessment and Plan:  Orders Placed This Encounter  Procedures  . DG Chest 2 View   1. Cough Reluctant to answer questions,"nothing helps" just prescription cough syrup, she does not remember name of medication.  We discussed possible etiologies. History does not seem to be concerning for a serious process. She is already on tramadol for pain management, recommend not to take it at the same time that Hycodan. Because she has had problem for 2 weeks + having surgery on 03/30/2020, I recommend CXR. Instructed to let me know if she develops fever,SOB,CP,or if problem gets worse.  - HYDROcodone-homatropine (HYCODAN) 5-1.5 MG/5ML syrup; Take 5 mLs by mouth every 12 (twelve) hours as needed for up to 10 days.  Dispense: 100 mL; Refill: 0  2. Seasonal allergies This problem could be aggravating cough. According the patient, antiallergy medications have not worked in the past. No further recommendation at this time.   Follow Up Instructions:  Return if symptoms worsen or fail to improve.  I did not refer this patient for an OV in the next 24 hours for this/these issue(s).  I discussed the assessment and treatment plan with the patient.  Cheryl. Comins was provided an opportunity to ask questions and all were answered.  She agreed with the plan and demonstrated an understanding of the instructions.  I provided 12-13 minutes of non-face-to-face time during this encounter.   Cheryl Tech [name], MD

## 2020-03-12 ENCOUNTER — Other Ambulatory Visit: Payer: Self-pay

## 2020-03-12 ENCOUNTER — Ambulatory Visit: Payer: BC Managed Care – PPO | Admitting: Physical Therapy

## 2020-03-12 DIAGNOSIS — R2689 Other abnormalities of gait and mobility: Secondary | ICD-10-CM

## 2020-03-12 DIAGNOSIS — M5442 Lumbago with sciatica, left side: Secondary | ICD-10-CM | POA: Diagnosis not present

## 2020-03-12 DIAGNOSIS — R2681 Unsteadiness on feet: Secondary | ICD-10-CM | POA: Diagnosis not present

## 2020-03-12 DIAGNOSIS — G8929 Other chronic pain: Secondary | ICD-10-CM | POA: Diagnosis not present

## 2020-03-12 DIAGNOSIS — M6281 Muscle weakness (generalized): Secondary | ICD-10-CM

## 2020-03-13 ENCOUNTER — Encounter: Payer: Self-pay | Admitting: Physical Therapy

## 2020-03-13 NOTE — Therapy (Signed)
[facility] [address] [facility] [place], [name], [address] Phone: [phone]   Fax:  [phone]  Physical Therapy Treatment  Patient Details  Name: [name] MRN: [id number] Date of Birth: 10-26-1961 Referring Provider (PT): Danella Sensing, NP   Encounter Date: 03/12/2020  PT End of Session - 03/13/20 2207    Visit Number  7    Number of Visits  9    Date for PT Re-Evaluation  03/13/20    Authorization Type  BCBS    Authorization - Visit Number  7    PT Start Time  [id number]    PT Stop Time  1555    PT Time Calculation (min)  50 min    Equipment Utilized During Treatment  Other (comment)   bar bells, pool noodle, ankle cuffs   Activity Tolerance  Patient tolerated treatment well    Behavior During Therapy  [facility] for tasks assessed/performed       Past Medical History:  Diagnosis Date  . Anxiety   . Back pain    related to spinal stenosis and disc problem, radiates down left buttocks to leg., weakness occ.  [name] Dyspnea   . Headache   . Hyperlipidemia   . Hypertension   . Lumbar post-laminectomy syndrome   . PONV (postoperative nausea and vomiting)   . Vaginal foreign object    "Uses Femring"    Past Surgical History:  Procedure Laterality Date  . ABDOMINAL HYSTERECTOMY    . CARDIAC CATHETERIZATION N/A 04/18/2015   Procedure: Left Heart Cath and Coronary Angiography;  Surgeon: [name], MD;  Location: [facility];  Service: Cardiovascular;  Laterality: N/A;  . COLONOSCOPY W/ BIOPSIES AND POLYPECTOMY    . FOOT SURGERY Bilateral    [place] "bunion,bone spur, tendon" (1) -6'16, (1)-10'16  . IR EPIDUROGRAPHY  07/21/2018  . LUMBAR LAMINECTOMY/DECOMPRESSION MICRODISCECTOMY Bilateral 12/28/2015   Procedure: MICRO LUMBAR DECOMPRESSION L4 - L5 BILATERALLY;  Surgeon: [name], MD;  [facility]: [facility];  Service: Orthopedics;  Laterality: Bilateral;  . LUMBAR LAMINECTOMY/DECOMPRESSION  MICRODISCECTOMY Bilateral 03/04/2018   Procedure: Revision of Microlumbar Decompression Bilateral Lumbar Four-Five;  Surgeon: [name], MD;  Location: [facility];  Service: Orthopedics;  Laterality: Bilateral;  90 mins  . SPINAL CORD STIMULATOR INSERTION N/A 09/28/2019   Procedure: THORACIC SPINAL CORD STIMULATOR INSERTION;  Surgeon: Melina Schools, MD;  Location: [facility];  Service: Orthopedics;  Laterality: N/A;  2.5 hrs  . TUBAL LIGATION    . WISDOM TOOTH EXTRACTION    . WOUND EXPLORATION N/A 03/04/2018   Procedure: EXPLORATION OF LUMBAR DECOMPRESSION WOUND;  Surgeon: [name], MD;  Location: [facility];  Service: Orthopedics;  Laterality: N/A;    There were no vitals filed for this visit.  Subjective Assessment - 03/13/20 2205    Subjective  Pt reports she is scheduled for surgery on 04-02-20 for revision of pain stimulator (changing the location of the implant)    Currently in Pain?  Yes    Pain Score  7     Pain Location  Hip    Pain Orientation  Left    Pain Descriptors / Indicators  Burning    Pain Type  Surgical pain    Pain Onset  More than a month ago    Pain Frequency  Constant         Aquatic therapy at [facility] - pool temp 87.4 degrees   Patient seen for aquatic therapy today.  Treatment took place  in water 3.5-4 feet deep depending upon activity.  Pt entered and  Exited the pool via ramp negotiation with use of bilateral hand rails with supervision.  Pt performed runner's stretch on each leg and heel cord stretch bil. LE's with feet on pool wall - 30 sec hold each stretch x 1 rep Pt performed amb. 41m across pool 3 reps forward;  sideways 1 rep with use of noodle for UE support with therapist holding noodle for stability  Pt performed forwards amb. With use of bar bells for reciprocal arm movement with CGA - 20m x 2 reps with CGA  Pt performed standing hip flexion, abduction and hip extension with knee extended -  10 reps each leg with UE support with use of ankle cuffs for  increased resistance for strengthening Marching in place 10 reps each leg with UE support  Squats x 10 reps with bil. UE support on pool edge Heel raises x 10 reps - bilateral LE's with UE support on pool wall   Pt requires aquatic therapy for the unweighting provided by the buoyancy of the water and needs the viscosity for strengthening LE's and trunk musc.; buoyancy of water needed for spinal decompression for increased tolerance for movement with less pain experienced than with land exercises                                    PT Short Term Goals - 03/13/20 2214      PT SHORT TERM GOAL #1   Title  Pt will report decreased pain in low and Rt hip area after aquatic therapy to </= 3/10 for increased ease with mobility.    Baseline  4/10 on 01-12-20    Time  4    Period  Weeks    Status  New    Target Date  02/24/20   target date extended due to wait list for aquatic therapy     PT SHORT TERM GOAL #2   Title  Pt will report ability to amb. with 1 forearm crutch for 25' in home environment.    Time  4    Period  Weeks    Status  New    Target Date  02/24/20        PT Long Term Goals - 03/13/20 2214      PT LONG TERM GOAL #1   Title  Pt will be independent in HEP for aquatic exercises to be continued upon discharge from PT.    Time  8    Period  Weeks    Status  New      PT LONG TERM GOAL #2   Title  Pt will report 50% decrease in pain (</= 2/10 intensity) in low back/Rt hip region for increased comfort and ease with mobility and ADL's.    Baseline  4/10 intensity on 01-12-20    Time  8    Period  Weeks    Status  New            Plan - 03/13/20 2209    Clinical Impression Statement  Pt progressing slowly towards goals - continues to need bil. UE support with water walking due to spasticity in RLE; continues to have decreased Rt knee flexion in swing phase of gait due to spasticity    Personal Factors and Comorbidities  Past/Current  Experience;Comorbidity 2;Time since onset of injury/illness/exacerbation    Examination-Activity Limitations  Stand;Locomotion Level;Bend;Squat;Stairs;Transfers;Carry    Examination-Participation Restrictions  Cleaning;Community Activity;Meal Prep;Shop    Stability/Clinical Decision Making  Stable/Uncomplicated    Rehab Potential  Good    PT Frequency  1x / week    PT Duration  8 weeks    PT Treatment/Interventions  Manual techniques;Patient/family education;Therapeutic exercise;Therapeutic activities;Gait training;ADLs/Self Care Home Management;Neuromuscular re-education;Functional mobility training;Passive range of motion;Balance training;Aquatic Therapy;Biofeedback;DME Instruction;Stair training;Orthotic Fit/Training;Dry needling    PT Next Visit Plan  cont aquatic therapy for back pain and for improved mobility    Consulted and Agree with Plan of Care  Patient       Patient will benefit from skilled therapeutic intervention in order to improve the following deficits and impairments:  Pain, Postural dysfunction, Difficulty walking, Decreased strength, Decreased activity tolerance, Abnormal gait, Increased muscle spasms, Decreased endurance, Decreased balance, Decreased coordination, Decreased knowledge of use of DME, Decreased mobility, Decreased safety awareness, Impaired sensation, Impaired tone  Visit Diagnosis: Muscle weakness (generalized)  Other abnormalities of gait and mobility     Problem List Patient Active Problem List   Diagnosis Date Noted  . Chronic pain 09/28/2019  . Lumbar spinal stenosis   . Radicular pain   . Gait disorder   . Difficulty in urination 07/13/2018  . Back pain 07/12/2018  . Hyperlipidemia 04/16/2018  . Anxiety disorder, unspecified 04/16/2018  . AKI (acute kidney injury) ([place])   . Benign essential HTN   . Depression with anxiety   . Lumbar radiculopathy 03/09/2018  . Paraparesis ([place]) 03/09/2018  . Essential hypertension   . Chronic  nonintractable headache   . Leukocytosis   . Acute blood loss anemia   . Postoperative pain   . Generalized weakness   . Spinal stenosis at L4-L5 level 03/04/2018  . Spinal stenosis of lumbar region 12/28/2015  . Chest pain 04/16/2015    DildayJenness Corner, PT, [place] 03/13/2020, 10:19 PM  [facility] [address] [place], [name], [address] Phone: [phone]   Fax:  [phone]  Name: LAURELEI LAMBIE MRN: [id number] Date of Birth: [birth year]

## 2020-03-14 NOTE — Telephone Encounter (Signed)
Form faxed on 03/13/2020.

## 2020-03-16 ENCOUNTER — Ambulatory Visit (INDEPENDENT_AMBULATORY_CARE_PROVIDER_SITE_OTHER)
Admission: RE | Admit: 2020-03-16 | Discharge: 2020-03-16 | Disposition: A | Discharge status: Home / Self Care | Payer: BC Managed Care – PPO | Source: Ambulatory Visit | Attending: Family Medicine | Admitting: Family Medicine

## 2020-03-16 ENCOUNTER — Other Ambulatory Visit: Payer: Self-pay

## 2020-03-16 DIAGNOSIS — R059 Cough, unspecified: Secondary | ICD-10-CM

## 2020-03-16 DIAGNOSIS — R05 Cough: Secondary | ICD-10-CM

## 2020-03-19 ENCOUNTER — Other Ambulatory Visit: Payer: Self-pay

## 2020-03-19 ENCOUNTER — Ambulatory Visit: Payer: BC Managed Care – PPO | Admitting: Physical Therapy

## 2020-03-19 DIAGNOSIS — M5442 Lumbago with sciatica, left side: Secondary | ICD-10-CM | POA: Diagnosis not present

## 2020-03-19 DIAGNOSIS — R2689 Other abnormalities of gait and mobility: Secondary | ICD-10-CM

## 2020-03-19 DIAGNOSIS — M6281 Muscle weakness (generalized): Secondary | ICD-10-CM | POA: Diagnosis not present

## 2020-03-19 DIAGNOSIS — R2681 Unsteadiness on feet: Secondary | ICD-10-CM

## 2020-03-19 DIAGNOSIS — G8929 Other chronic pain: Secondary | ICD-10-CM | POA: Diagnosis not present

## 2020-03-20 ENCOUNTER — Encounter: Payer: Self-pay | Admitting: Physical Therapy

## 2020-03-20 NOTE — Therapy (Signed)
[facility] [address] [place], [name], [address] Phone: [phone]   Fax:  [phone]  Physical Therapy Treatment  Patient Details  Name: [name] MRN: [id number] Date of Birth: January 05, 1961 Referring Provider (PT): Danella Sensing, NP   Encounter Date: 03/19/2020  PT End of Session - 03/20/20 1815    Visit Number  8    Number of Visits  9    Date for PT Re-Evaluation  03/13/20    Authorization Type  BCBS    Authorization - Visit Number  8    PT Start Time  1500    PT Stop Time  1558    PT Time Calculation (min)  58 min    Equipment Utilized During Treatment  Other (comment)   bar bells, pool noodle, ankle cuffs   Activity Tolerance  Patient tolerated treatment well    Behavior During Therapy  [facility] for tasks assessed/performed       Past Medical History:  Diagnosis Date  . Anxiety   . Back pain    related to spinal stenosis and disc problem, radiates down left buttocks to leg., weakness occ.  [name] Dyspnea   . Headache   . Hyperlipidemia   . Hypertension   . Lumbar post-laminectomy syndrome   . PONV (postoperative nausea and vomiting)   . Vaginal foreign object    "Uses Femring"    Past Surgical History:  Procedure Laterality Date  . ABDOMINAL HYSTERECTOMY    . CARDIAC CATHETERIZATION N/A 04/18/2015   Procedure: Left Heart Cath and Coronary Angiography;  Surgeon: [name], MD;  Location: [facility];  Service: Cardiovascular;  Laterality: N/A;  . COLONOSCOPY W/ BIOPSIES AND POLYPECTOMY    . FOOT SURGERY Bilateral    [place] "bunion,bone spur, tendon" (1) -6'16, (1)-10'16  . IR EPIDUROGRAPHY  07/21/2018  . LUMBAR LAMINECTOMY/DECOMPRESSION MICRODISCECTOMY Bilateral 12/28/2015   Procedure: MICRO LUMBAR DECOMPRESSION L4 - L5 BILATERALLY;  Surgeon: [name], MD;  [facility];  Service: Orthopedics;  Laterality: Bilateral;  . LUMBAR LAMINECTOMY/DECOMPRESSION  MICRODISCECTOMY Bilateral 03/04/2018   Procedure: Revision of Microlumbar Decompression Bilateral Lumbar Four-Five;  Surgeon: [name], MD;  Location: [facility];  Service: Orthopedics;  Laterality: Bilateral;  90 mins  . SPINAL CORD STIMULATOR INSERTION N/A 09/28/2019   Procedure: THORACIC SPINAL CORD STIMULATOR INSERTION;  Surgeon: Melina Schools, MD;  Location: [facility];  Service: Orthopedics;  Laterality: N/A;  2.5 hrs  . TUBAL LIGATION    . WISDOM TOOTH EXTRACTION    . WOUND EXPLORATION N/A 03/04/2018   Procedure: EXPLORATION OF LUMBAR DECOMPRESSION WOUND;  Surgeon: [name], MD;  Location: [facility];  Service: Orthopedics;  Laterality: N/A;    There were no vitals filed for this visit.  Subjective Assessment - 03/20/20 1809    Subjective  Pt states she had a fall at home on Saturday (03-17-20) - states she did not get seriously hurt, just a little soreness in her back and hip; pt states she crawled to a chair in her dining room and pulled herself up. Pt is scheduled for revision of pain stimulator implant on Fri., June 4    Currently in Pain?  Yes    Pain Score  7     Pain Location  Hip    Pain Orientation  Left    Pain Descriptors / Indicators  Burning    Pain Type  Surgical pain    Pain Onset  More than a month ago  Pain Frequency  Constant             Aquatic therapy at [facility] - pool temp 87.4 degrees   Patient seen for aquatic therapy today.  Treatment took place in water 3.5-4 feet deep depending upon activity.  Pt entered and  Exited the pool via ramp negotiation with use of bilateral hand rails with supervision.  Pt performed runner's stretch on each leg and heel cord stretch bil. LE's with feet on pool wall - 30 sec hold each stretch x 1 rep Pt performed amb. 71m across pool 6 reps forward;  sideways 2 reps approx. 21m  with use of noodle for UE support with therapist holding noodle for stability  Attempted backwards amb. With use of pool noodle but pt was trembly with  unsteadiness and requested to not attempt this exercise, stating she was nervous due to having had fall on Saturday  Pt performed standing hip flexion, abduction and hip extension with knee extended -  10 reps each leg with UE support with use of ankle cuffs for increased resistance for strengthening Marching in place 10 reps each leg with UE support with use of ankle cuffs for increased resistance with the eccentric contraction  Squats x 10 reps with bil. UE support on pool edge  Pt requires aquatic therapy for the unweighting provided by the buoyancy of the water and needs the viscosity for strengthening LE's and trunk musc.; buoyancy of water needed for spinal decompression for increased tolerance for movement with less pain experienced than with land exercises                            PT Short Term Goals - 03/20/20 1824      PT SHORT TERM GOAL #1   Title  Pt will report decreased pain in low and Rt hip area after aquatic therapy to </= 3/10 for increased ease with mobility.    Baseline  4/10 on 01-12-20    Time  4    Period  Weeks    Status  New    Target Date  02/24/20   target date extended due to wait list for aquatic therapy     PT SHORT TERM GOAL #2   Title  Pt will report ability to amb. with 1 forearm crutch for 25' in home environment.    Time  4    Period  Weeks    Status  New    Target Date  02/24/20        PT Long Term Goals - 03/20/20 1824      PT LONG TERM GOAL #1   Title  Pt will be independent in HEP for aquatic exercises to be continued upon discharge from PT.    Time  8    Period  Weeks    Status  New      PT LONG TERM GOAL #2   Title  Pt will report 50% decrease in pain (</= 2/10 intensity) in low back/Rt hip region for increased comfort and ease with mobility and ADL's.    Baseline  4/10 intensity on 01-12-20    Time  8    Period  Weeks    Status  New            Plan - 03/20/20 1816    Clinical Impression Statement   Pt presenting with decreased mobility and balance and c/o incr. weakness in LLE in today's aquatic  session compared to that in last week's session.  Pt states that the continual burning sensation in her Lt hip results in weakness in her LLE which is what made her fall on Saturday.  Pt now presents with increased fear of falling.  No significant change in LLE strength noted during aquatic therapy session.  Pt requested to use pool noodle for UE support with gait training rather than bar bells in today's session.    Personal Factors and Comorbidities  Past/Current Experience;Comorbidity 2;Time since onset of injury/illness/exacerbation    Examination-Activity Limitations  Stand;Locomotion Level;Bend;Squat;Stairs;Transfers;Carry    Examination-Participation Restrictions  Cleaning;Community Activity;Meal Prep;Shop    Stability/Clinical Decision Making  Stable/Uncomplicated    Rehab Potential  Good    PT Frequency  1x / week    PT Duration  8 weeks    PT Treatment/Interventions  Manual techniques;Patient/family education;Therapeutic exercise;Therapeutic activities;Gait training;ADLs/Self Care Home Management;Neuromuscular re-education;Functional mobility training;Passive range of motion;Balance training;Aquatic Therapy;Biofeedback;DME Instruction;Stair training;Orthotic Fit/Training;Dry needling    PT Next Visit Plan  pt scheduled for sx on 03-30-20 for stimulator implant revision; resume with orders from MD when healed    Consulted and Agree with Plan of Care  Patient       Patient will benefit from skilled therapeutic intervention in order to improve the following deficits and impairments:  Pain, Postural dysfunction, Difficulty walking, Decreased strength, Decreased activity tolerance, Abnormal gait, Increased muscle spasms, Decreased endurance, Decreased balance, Decreased coordination, Decreased knowledge of use of DME, Decreased mobility, Decreased safety awareness, Impaired sensation, Impaired  tone  Visit Diagnosis: Other abnormalities of gait and mobility  Unsteadiness on feet  Muscle weakness (generalized)     Problem List Patient Active Problem List   Diagnosis Date Noted  . Chronic pain 09/28/2019  . Lumbar spinal stenosis   . Radicular pain   . Gait disorder   . Difficulty in urination 07/13/2018  . Back pain 07/12/2018  . Hyperlipidemia 04/16/2018  . Anxiety disorder, unspecified 04/16/2018  . AKI (acute kidney injury) ([place])   . Benign essential HTN   . Depression with anxiety   . Lumbar radiculopathy 03/09/2018  . Paraparesis ([place]) 03/09/2018  . Essential hypertension   . Chronic nonintractable headache   . Leukocytosis   . Acute blood loss anemia   . Postoperative pain   . Generalized weakness   . Spinal stenosis at L4-L5 level 03/04/2018  . Spinal stenosis of lumbar region 12/28/2015  . Chest pain 04/16/2015    Dayane Hillenburg, [place], PT, [facility] 03/20/2020, 6:26 PM  [facility] [address] [place], [name], [address] Phone: [phone]   Fax:  [phone]  Name: HONORE PENSE MRN: [id number] Date of Birth: 18-Apr-1961

## 2020-03-27 DIAGNOSIS — Z01818 Encounter for other preprocedural examination: Secondary | ICD-10-CM | POA: Diagnosis not present

## 2020-03-28 ENCOUNTER — Ambulatory Visit (INDEPENDENT_AMBULATORY_CARE_PROVIDER_SITE_OTHER): Payer: BC Managed Care – PPO | Admitting: Family Medicine

## 2020-03-28 ENCOUNTER — Encounter: Payer: Self-pay | Admitting: Family Medicine

## 2020-03-28 ENCOUNTER — Other Ambulatory Visit: Payer: Self-pay

## 2020-03-28 VITALS — BP 142/95 | HR 86 | Temp 97.9°F | Wt 173.0 lb

## 2020-03-28 DIAGNOSIS — Z01818 Encounter for other preprocedural examination: Secondary | ICD-10-CM

## 2020-03-28 DIAGNOSIS — J302 Other seasonal allergic rhinitis: Secondary | ICD-10-CM

## 2020-03-28 DIAGNOSIS — I10 Essential (primary) hypertension: Secondary | ICD-10-CM

## 2020-03-28 MED FILL — HIBICLENS 4% LIQUID: 4 | 1 days supply | Qty: 118 | Fill #0

## 2020-03-28 NOTE — Patient Instructions (Signed)
Managing Your Hypertension Hypertension is commonly called high blood pressure. This is when the force of your blood pressing against the walls of your arteries is too strong. Arteries are blood vessels that carry blood from your heart throughout your body. Hypertension forces the heart to work harder to pump blood, and may cause the arteries to become narrow or stiff. Having untreated or uncontrolled hypertension can cause heart attack, stroke, kidney disease, and other problems. What are blood pressure readings? A blood pressure reading consists of a higher number over a lower number. Ideally, your blood pressure should be below 120/80. The first ("top") number is called the systolic pressure. It is a measure of the pressure in your arteries as your heart beats. The second ("bottom") number is called the diastolic pressure. It is a measure of the pressure in your arteries as the heart relaxes. What does my blood pressure reading mean? Blood pressure is classified into four stages. Based on your blood pressure reading, your health care provider may use the following stages to determine what type of treatment you need, if any. Systolic pressure and diastolic pressure are measured in a unit called mm Hg. Normal  Systolic pressure: below 120.  Diastolic pressure: below 80. Elevated  Systolic pressure: 120-129.  Diastolic pressure: below 80. Hypertension stage 1  Systolic pressure: 130-139.  Diastolic pressure: 80-89. Hypertension stage 2  Systolic pressure: 140 or above.  Diastolic pressure: 90 or above. What health risks are associated with hypertension? Managing your hypertension is an important responsibility. Uncontrolled hypertension can lead to:  A heart attack.  A stroke.  A weakened blood vessel (aneurysm).  Heart failure.  Kidney damage.  Eye damage.  Metabolic syndrome.  Memory and concentration problems. What changes can I make to manage my  hypertension? Hypertension can be managed by making lifestyle changes and possibly by taking medicines. Your health care provider will help you make a plan to bring your blood pressure within a normal range. Eating and drinking   Eat a diet that is high in fiber and potassium, and low in salt (sodium), added sugar, and fat. An example eating plan is called the DASH (Dietary Approaches to Stop Hypertension) diet. To eat this way: ? Eat plenty of fresh fruits and vegetables. Try to fill half of your plate at each meal with fruits and vegetables. ? Eat whole grains, such as whole wheat pasta, brown rice, or whole grain bread. Fill about one quarter of your plate with whole grains. ? Eat low-fat diary products. ? Avoid fatty cuts of meat, processed or cured meats, and poultry with skin. Fill about one quarter of your plate with lean proteins such as fish, chicken without skin, beans, eggs, and tofu. ? Avoid premade and processed foods. These tend to be higher in sodium, added sugar, and fat.  Reduce your daily sodium intake. Most people with hypertension should eat less than 1,500 mg of sodium a day.  Limit alcohol intake to no more than 1 drink a day for nonpregnant women and 2 drinks a day for men. One drink equals 12 oz of beer, 5 oz of wine, or 1 oz of hard liquor. Lifestyle  Work with your health care provider to maintain a healthy body weight, or to lose weight. Ask what an ideal weight is for you.  Get at least 30 minutes of exercise that causes your heart to beat faster (aerobic exercise) most days of the week. Activities may include walking, swimming, or biking.  Include exercise   to strengthen your muscles (resistance exercise), such as weight lifting, as part of your weekly exercise routine. Try to do these types of exercises for 30 minutes at least 3 days a week.  Do not use any products that contain nicotine or tobacco, such as cigarettes and e-cigarettes. If you need help quitting,  ask your health care provider.  Control any long-term (chronic) conditions you have, such as high cholesterol or diabetes. Monitoring  Monitor your blood pressure at home as told by your health care provider. Your personal target blood pressure may vary depending on your medical conditions, your age, and other factors.  Have your blood pressure checked regularly, as often as told by your health care provider. Working with your health care provider  Review all the medicines you take with your health care provider because there may be side effects or interactions.  Talk with your health care provider about your diet, exercise habits, and other lifestyle factors that may be contributing to hypertension.  Visit your health care provider regularly. Your health care provider can help you create and adjust your plan for managing hypertension. Will I need medicine to control my blood pressure? Your health care provider may prescribe medicine if lifestyle changes are not enough to get your blood pressure under control, and if:  Your systolic blood pressure is 130 or higher.  Your diastolic blood pressure is 80 or higher. Take medicines only as told by your health care provider. Follow the directions carefully. Blood pressure medicines must be taken as prescribed. The medicine does not work as well when you skip doses. Skipping doses also puts you at risk for problems. Contact a health care provider if:  You think you are having a reaction to medicines you have taken.  You have repeated (recurrent) headaches.  You feel dizzy.  You have swelling in your ankles.  You have trouble with your vision. Get help right away if:  You develop a severe headache or confusion.  You have unusual weakness or numbness, or you feel faint.  You have severe pain in your chest or abdomen.  You vomit repeatedly.  You have trouble breathing. Summary  Hypertension is when the force of blood pumping  through your arteries is too strong. If this condition is not controlled, it may put you at risk for serious complications.  Your personal target blood pressure may vary depending on your medical conditions, your age, and other factors. For most people, a normal blood pressure is less than 120/80.  Hypertension is managed by lifestyle changes, medicines, or both. Lifestyle changes include weight loss, eating a healthy, low-sodium diet, exercising more, and limiting alcohol. This information is not intended to replace advice given to you by your health care provider. Make sure you discuss any questions you have with your health care provider. Document Revised: 02/04/2019 Document Reviewed: 09/10/2016 Elsevier Patient Education  2020 [employer]  How to Take Your Blood Pressure You can take your blood pressure at home with a machine. You may need to check your blood pressure at home:  To check if you have high blood pressure (hypertension).  To check your blood pressure over time.  To make sure your blood pressure medicine is working. Supplies needed: You will need a blood pressure machine, or monitor. You can buy one at a drugstore or online. When choosing one:  Choose one with an arm cuff.  Choose one that wraps around your upper arm. Only one finger should fit between your arm   and the cuff.  Do not choose one that measures your blood pressure from your wrist or finger. Your doctor can suggest a monitor. How to prepare Avoid these things for 30 minutes before checking your blood pressure:  Drinking caffeine.  Drinking alcohol.  Eating.  Smoking.  Exercising. Five minutes before checking your blood pressure:  Pee.  Sit in a dining chair. Avoid sitting in a soft couch or armchair.  Be quiet. Do not talk. How to take your blood pressure Follow the instructions that came with your machine. If you have a digital blood pressure monitor, these may be the  instructions: 1. Sit up straight. 2. Place your feet on the floor. Do not cross your ankles or legs. 3. Rest your left arm at the level of your heart. You may rest it on a table, desk, or chair. 4. Pull up your shirt sleeve. 5. Wrap the blood pressure cuff around the upper part of your left arm. The cuff should be 1 inch (2.5 cm) above your elbow. It is best to wrap the cuff around bare skin. 6. Fit the cuff snugly around your arm. You should be able to place only one finger between the cuff and your arm. 7. Put the cord inside the groove of your elbow. 8. Press the power button. 9. Sit quietly while the cuff fills with air and loses air. 10. Write down the numbers on the screen. 11. Wait 2-3 minutes and then repeat steps 1-10. What do the numbers mean? Two numbers make up your blood pressure. The first number is called systolic pressure. The second is called diastolic pressure. An example of a blood pressure reading is "120 over 80" (or 120/80). If you are an adult and do not have a medical condition, use this guide to find out if your blood pressure is normal: Normal  First number: below 120.  Second number: below 80. Elevated  First number: 120-129.  Second number: below 80. Hypertension stage 1  First number: 130-139.  Second number: 80-89. Hypertension stage 2  First number: 140 or above.  Second number: 90 or above. Your blood pressure is above normal even if only the top or bottom number is above normal. Follow these instructions at home:  Check your blood pressure as often as your doctor tells you to.  Take your monitor to your next doctor's appointment. Your doctor will: ? Make sure you are using it correctly. ? Make sure it is working right.  Make sure you understand what your blood pressure numbers should be.  Tell your doctor if your medicines are causing side effects. Contact a doctor if:  Your blood pressure keeps being high. Get help right away  if:  Your first blood pressure number is higher than 180.  Your second blood pressure number is higher than 120. This information is not intended to replace advice given to you by your health care provider. Make sure you discuss any questions you have with your health care provider. Document Revised: 09/25/2017 Document Reviewed: 03/21/2016 Elsevier Patient Education  2020 [employer]  

## 2020-03-28 NOTE — Progress Notes (Signed)
Subjective:    Patient ID: [name], female    DOB: [birth year], 59 y.o.   MRN: [id number]  No chief complaint on file.   HPI Patient was seen today for follow-up on BP.  Pt notes elevated blood pressure this week at [facility] appointment.  Pt scheduled to have surgery on Friday to remove the stimulation device battery pack on her left lower back to the right d/t finding at site.  Pt does not have BP cuff at home.  Endorses drinking less water (1.5 bottles) and eating bacon this week.  After eating the bacon pt developed a headache her blood pressure was elevated at her appointment.  Also add salt to food.  Pt needs preop labs including a BMP and CBC.  Followed by [facility].  Pt notes h/o allergies.  Not currently taking any meds.  Claritin caused rash and itching.  And patient did not like Zyrtec.  Xyzal worked but is expensive. Past Medical History:  Diagnosis Date  . Anxiety   . Back pain    related to spinal stenosis and disc problem, radiates down left buttocks to leg., weakness occ.  [name] Dyspnea   . Headache   . Hyperlipidemia   . Hypertension   . Lumbar post-laminectomy syndrome   . PONV (postoperative nausea and vomiting)   . Vaginal foreign object    "Uses Femring"    Allergies  Allergen Reactions  . Cephalosporins Anaphylaxis  . Penicillins Anaphylaxis and Hives    Did it involve swelling of the face/tongue/throat, SOB, or low BP? Yes Did it involve sudden or severe rash/hives, skin peeling, or any reaction on the inside of your mouth or nose? Yes Did you need to seek medical attention at a hospital or doctor's office? Yes When did it last happen?10+ years If all above answers are "NO", may proceed with cephalosporin use.    . Anesthetics, Amide Nausea And Vomiting    Does not know name of it. States they put it on record foot center.   . Betadine [Povidone Iodine] Other (See Comments)    "Skin Burn" caused scar on Left buttock  . Peach [Prunus  Persica] Hives  . Latex Hives, Itching and Rash    ROS General: Denies fever, chills, night sweats, changes in weight, changes in appetite HEENT: Denies ear pain, changes in vision, rhinorrhea, sore throat  + headache CV: Denies CP, palpitations, SOB, orthopnea Pulm: Denies SOB, cough, wheezing GI: Denies abdominal pain, nausea, vomiting, diarrhea, constipation GU: Denies dysuria, hematuria, frequency, vaginal discharge Msk: Denies muscle cramps, joint pains Neuro: Denies weakness, numbness, tingling  +L lower back burning sensation at site of stimulation device battery. Skin: Denies rashes, bruising Psych: Denies depression, anxiety, hallucinations     Objective:    Blood pressure (!) 142/95, pulse 86, temperature 97.9 F (36.6 C), temperature source Temporal, weight 173 lb (78.5 kg), SpO2 98 %.   Gen. Pleasant, well-nourished, in no distress, normal affect  HEENT: [place]/AT, face symmetric, conjunctiva clear, no scleral icterus, PERRLA, EOMI, nares patent without drainage Lungs: no accessory muscle use, CTAB, no wheezes or rales Cardiovascular: RRR, no m/r/g, no peripheral edema Musculoskeletal: Wearing brace on RLE.  No deformities, no cyanosis or clubbing, normal tone Neuro:  A&Ox3, CN II-XII intact, using rollator to aid with ambulation Skin:  Warm, no lesions/ rash   Wt Readings from Last 3 Encounters:  12/30/19 180 lb 3.2 oz (81.7 kg)  09/28/19 178 lb 9.2 oz (81 kg)  09/26/19 178 lb 9.6  oz (81 kg)    Lab Results  Component Value Date   WBC 4.3 09/23/2019   HGB 12.5 09/23/2019   HCT 38.1 09/23/2019   PLT 283 09/23/2019   GLUCOSE 88 09/23/2019   CHOL 287 (H) 04/19/2018   TRIG 73.0 04/19/2018   HDL 76.40 04/19/2018   LDLCALC 196 (H) 04/19/2018   ALT 47 (H) 07/21/2018   AST 25 07/21/2018   NA 140 09/23/2019   K 4.1 09/23/2019   CL 104 09/23/2019   CREATININE 1.03 (H) 09/23/2019   BUN 10 09/23/2019   CO2 29 09/23/2019   TSH 1.80 04/01/2019   INR 1.0 09/23/2019    HGBA1C 5.5 03/06/2018    Assessment/Plan:  Essential hypertension  -BP initially 152/100.  Repeat 142/95 -Continue Coreg 6.25 twice daily -Discussed the importance of lifestyle modifications including limiting sodium intake and increasing p.o. intake of water -Hesitant to change BP meds as patient is unable to check it at home. -Discussed obtaining a BP monitor and keeping a log to bring with her to clinic -Given precautions - Plan: Basic metabolic panel  Pre-op testing -Labs as requested per Ortho - Plan: CBC (no diff), Basic metabolic panel  Seasonal allergies -Xyzal as needed -Consider nasal saline rinse  F/u in 2 wks  Grier Mitts, MD

## 2020-03-29 LAB — BASIC METABOLIC PANEL
BUN: 13 mg/dL (ref 6–23)
CO2: 32 mEq/L (ref 19–32)
Calcium: 9.1 mg/dL (ref 8.4–10.5)
Chloride: 104 mEq/L (ref 96–112)
Creatinine, Ser: 1.1 mg/dL (ref 0.40–1.20)
GFR: 61.59 mL/min (ref >60.00)
Glucose, Bld: 71 mg/dL (ref 70–99)
Potassium: 4.6 mEq/L (ref 3.5–5.1)
Sodium: 140 mEq/L (ref 135–145)

## 2020-03-29 LAB — CBC
HCT: 37.2 % (ref 36.0–46.0)
Hemoglobin: 12.4 g/dL (ref 12.0–15.0)
MCHC: 33.4 g/dL (ref 30.0–36.0)
MCV: 92.2 fl (ref 78.0–100.0)
Platelets: 280 10*3/uL (ref 150.0–400.0)
RBC: 4.03 Mil/uL (ref 3.87–5.11)
RDW: 14 % (ref 11.5–15.5)
WBC: 5.1 10*3/uL (ref 4.0–10.5)

## 2020-03-30 DIAGNOSIS — T84498A Other mechanical complication of other internal orthopedic devices, implants and grafts, initial encounter: Secondary | ICD-10-CM | POA: Diagnosis not present

## 2020-03-30 DIAGNOSIS — T85840A Pain due to nervous system prosthetic devices, implants and grafts, initial encounter: Secondary | ICD-10-CM | POA: Diagnosis not present

## 2020-03-30 MED FILL — ONDANSETRON HCL 8 MG TABLET: 8 | 10 days supply | Qty: 30 | Fill #0

## 2020-03-30 MED FILL — METHOCARBAMOL 500 MG TABS: 500 | 5 days supply | Qty: 15 | Fill #0

## 2020-03-30 MED FILL — OXYCODONE-APAP 10-325: 10-325 | 5 days supply | Qty: 20 | Fill #0

## 2020-04-02 ENCOUNTER — Ambulatory Visit: Payer: BC Managed Care – PPO | Admitting: Family Medicine

## 2020-04-03 ENCOUNTER — Other Ambulatory Visit: Payer: Self-pay | Admitting: Obstetrics and Gynecology

## 2020-04-03 DIAGNOSIS — Z1231 Encounter for screening mammogram for malignant neoplasm of breast: Secondary | ICD-10-CM

## 2020-04-13 MED FILL — BACLOFEN 10 MG TABS: 10 | 30 days supply | Qty: 90 | Fill #1

## 2020-04-16 MED FILL — METHOCARBAMOL 500 MG TABS: 500 | 10 days supply | Qty: 30 | Fill #0

## 2020-04-19 ENCOUNTER — Other Ambulatory Visit: Payer: Self-pay

## 2020-04-20 ENCOUNTER — Ambulatory Visit: Payer: BC Managed Care – PPO | Admitting: Family Medicine

## 2020-04-20 ENCOUNTER — Encounter: Payer: Self-pay | Admitting: Family Medicine

## 2020-04-20 VITALS — BP 130/80 | HR 86 | Temp 97.0°F | Resp 12 | Ht 65.0 in | Wt 172.5 lb

## 2020-04-20 DIAGNOSIS — E785 Hyperlipidemia, unspecified: Secondary | ICD-10-CM

## 2020-04-20 DIAGNOSIS — Z1159 Encounter for screening for other viral diseases: Secondary | ICD-10-CM

## 2020-04-20 DIAGNOSIS — F330 Major depressive disorder, recurrent, mild: Secondary | ICD-10-CM

## 2020-04-20 DIAGNOSIS — F419 Anxiety disorder, unspecified: Secondary | ICD-10-CM

## 2020-04-20 DIAGNOSIS — I10 Essential (primary) hypertension: Secondary | ICD-10-CM | POA: Diagnosis not present

## 2020-04-20 DIAGNOSIS — M5410 Radiculopathy, site unspecified: Secondary | ICD-10-CM

## 2020-04-20 LAB — LIPID PANEL
Cholesterol: 198 mg/dL (ref 0–200)
HDL: 61.7 mg/dL (ref >39.00)
LDL Cholesterol: 123 mg/dL — ABNORMAL HIGH (ref 0–99)
NonHDL: 135.89
Total CHOL/HDL Ratio: 3
Triglycerides: 62 mg/dL (ref 0.0–149.0)
VLDL: 12.4 mg/dL (ref 0.0–40.0)

## 2020-04-20 LAB — HEPATIC FUNCTION PANEL
ALT: 19 U/L (ref 0–35)
AST: 16 U/L (ref 0–37)
Albumin: 4.1 g/dL (ref 3.5–5.2)
Alkaline Phosphatase: 70 U/L (ref 39–117)
Bilirubin, Direct: 0.1 mg/dL (ref 0.0–0.3)
Total Bilirubin: 0.4 mg/dL (ref 0.2–1.2)
Total Protein: 6.6 g/dL (ref 6.0–8.3)

## 2020-04-20 MED ORDER — DULOXETINE HCL 30 MG PO CPEP: 30.0000 mg | ORAL_CAPSULE | Freq: Every day | ORAL | 1 refills | Status: DC

## 2020-04-20 MED FILL — DULoxetine HCL 30 MG CPEP: 30 | 30 days supply | Qty: 30 | Fill #0

## 2020-04-20 NOTE — Patient Instructions (Signed)
A few things to remember from today's visit:   Essential hypertension  Hyperlipidemia, unspecified hyperlipidemia type - Plan: Hepatic function panel, Lipid panel  Depression with anxiety - Plan: DULoxetine (CYMBALTA) 30 MG capsule  Radicular pain - Plan: DULoxetine (CYMBALTA) 30 MG capsule  Encounter for HCV screening test for low risk patient - Plan: Hepatitis C antibody  Cymbalta started today.This medication could interact with Tramadol,so if not helping you can discontinue it (Tramadol).  Please let me know in 4-6 weeks how are you tolerating Cymbalta, we could increase dose to 60 mg.  If you need refills please call your pharmacy. Do not use My Chart to request refills or for acute issues that need immediate attention.    Please be sure medication list is accurate. If a new problem present, please set up appointment sooner than planned today.

## 2020-04-20 NOTE — Assessment & Plan Note (Signed)
Not well controlled. She agrees with trying Cymbalta 30 mg daily. Some side effects discussed.

## 2020-04-20 NOTE — Assessment & Plan Note (Signed)
Continue Atorvastatin 20 mg daily and low fat diet. Medication dose will be adjusted, if needed,according to lipid panel results.

## 2020-04-20 NOTE — Assessment & Plan Note (Signed)
BP adequately controlled. No changes in current management. 

## 2020-04-20 NOTE — Progress Notes (Signed)
HPI: [name] is a 59 y.o. female, who is here today for chronic disease management.  She was last seen on 03/07/20, virtual visit. Since her last visit she has undergone back surgery, complications with spinal cord stimulator placed in 11/2018. Still recovering , c/o severe pain around surgical area. She is following with ortho, who is also managing pain.  HTN: She is on Carvedilol 6.25 mg bid. Negative for severe/frequent headache, visual changes, chest pain, dyspnea, palpitation,new focal weakness (right dropped foot), or edema.  Lab Results  Component Value Date   CREATININE 1.10 03/28/2020   BUN 13 03/28/2020   NA 140 03/28/2020   K 4.6 03/28/2020   CL 104 03/28/2020   CO2 32 03/28/2020   HLD: She is on Atorvastatin 20 mg daily. Tolerating medication well.  Lab Results  Component Value Date   CHOL 287 (H) 04/19/2018   HDL 76.40 04/19/2018   LDLCALC 196 (H) 04/19/2018   TRIG 73.0 04/19/2018   CHOLHDL 4 04/19/2018   Elevated ALT.  Negative for abdominal pain,N/V,or jaundice. Lab Results   Component Value Date   ALT 47 (H) 07/21/2018   AST 25 07/21/2018   ALKPHOS 50 07/21/2018   BILITOT 0.3 07/21/2018   Anxiety and depression: She is on Alprazolam 0.25 mg daily as needed. Problem is mildly worse due to health issues and recent complications. Negative for suicidal thoughts.  Depression screen [facility] 2/9 04/20/2020 08/17/2018 07/05/2018 05/03/2018 04/01/2018  Decreased Interest 2 0 0 0 1  Down, Depressed, Hopeless 0 0 0 0 1  PHQ - 2 Score 2 0 0 0 2  Altered sleeping 2 - - - 1  Tired, decreased energy 2 - - - 1  Change in appetite 1 - - - 1  Feeling bad or failure about yourself  0 - - - 0  Trouble concentrating 2 - - - 0  Moving slowly or fidgety/restless 1 - - - 0  Suicidal thoughts 0 - - - 0  PHQ-9 Score 10 - - - 5  Difficult doing work/chores Very difficult - - - Somewhat difficult  Some recent data might be hidden   GAD 7 : Generalized  Anxiety Score 04/20/2020  Nervous, Anxious, on Edge 2  Control/stop worrying 2  Worry too much - different things 2  Trouble relaxing 2  Restless 1  Easily annoyed or irritable 2  Afraid - awful might happen 1  Total GAD 7 Score 12  Anxiety Difficulty Very difficult   Chronic back pain with radiculopathy.  She takes Tramadol for pain but it is not helping. Next appt with ortho this coming Friday.  She is also on Percocet, which she feels like it is "strong." She is also on Gabapentin 300 mg qid.  Review of Systems  Constitutional: Positive for fatigue. Negative for activity change, appetite change and fever.  HENT: Negative for mouth sores, nosebleeds and sore throat.   Eyes: Negative for redness and visual disturbance.  Respiratory: Negative for cough and wheezing.   Gastrointestinal: Negative for abdominal pain, nausea and vomiting.       Negative for changes in bowel habits.  Genitourinary: Negative for decreased urine volume, dysuria and hematuria.  Musculoskeletal: Positive for back pain and gait problem.  Neurological: Negative for syncope, facial asymmetry and weakness.  Psychiatric/Behavioral: Positive for sleep disturbance. Negative for confusion. The patient is nervous/anxious.   Rest of ROS, see pertinent positives sand negatives in HPI  Current Outpatient Medications on File  Prior to Visit  Medication Sig Dispense Refill  . ALPRAZolam (XANAX) 0.25 MG tablet TAKE 1 TABLET BY MOUTH ONCE DAILY AS NEEDED FOR ANXIETY OR SLEEP 30 tablet 0  . atorvastatin (LIPITOR) 20 MG tablet Take 1 tablet (20 mg total) by mouth daily. 90 tablet 0  . baclofen (LIORESAL) 10 MG tablet Take 10 mg by mouth 3 (three) times daily as needed.    . carvedilol (COREG) 6.25 MG tablet TAKE 1 TABLET (6.25 MG TOTAL) BY MOUTH 2 TIMES DAILY. CONTACT PCP FOR FURTHER FILLS 180 tablet 0  . diphenhydrAMINE (BENADRYL) 25 MG tablet Take 25 mg by mouth daily as needed for allergies.    [name] gabapentin (NEURONTIN)  300 MG capsule Take 300 mg by mouth 4 (four) times daily.    . methocarbamol (ROBAXIN) 500 MG tablet methocarbamol 500 mg tablet  Take 1 tablet 3 times a day by oral route as needed for 5 days.    . ondansetron (ZOFRAN) 8 MG tablet Zofran 8 mg tablet  Take 1 tablet every 8 hours by oral route as needed for 10 days.    [name] oxyCODONE-acetaminophen (PERCOCET) 10-325 MG tablet Take 1 tablet by mouth every 6 (six) hours as needed.    . polyethylene glycol (MIRALAX / GLYCOLAX) packet Take 17 g by mouth daily. (Patient taking differently: Take 17 g by mouth daily as needed for mild constipation. ) 30 each 0  . traMADol (ULTRAM) 50 MG tablet Take by mouth every 6 (six) hours as needed. Dr. Nelva Bush prescribing     No current facility-administered medications on file prior to visit.   Past Medical History:  Diagnosis Date  . Anxiety   . Back pain    related to spinal stenosis and disc problem, radiates down left buttocks to leg., weakness occ.  [name] Dyspnea   . Headache   . Hyperlipidemia   . Hypertension   . Lumbar post-laminectomy syndrome   . PONV (postoperative nausea and vomiting)   . Vaginal foreign object    "Uses Femring"   Allergies  Allergen Reactions  . Cephalosporins Anaphylaxis  . Penicillins Anaphylaxis and Hives    Did it involve swelling of the face/tongue/throat, SOB, or low BP? Yes Did it involve sudden or severe rash/hives, skin peeling, or any reaction on the inside of your mouth or nose? Yes Did you need to seek medical attention at a hospital or doctor's office? Yes When did it last happen?10+ years If all above answers are "NO", may proceed with cephalosporin use.    . Anesthetics, Amide Nausea And Vomiting    Does not know name of it. States they put it on record foot center.   . Betadine [Povidone Iodine] Other (See Comments)    "Skin Burn" caused scar on Left buttock  . Peach [Prunus Persica] Hives  . Claritin [Loratadine]     Rash, pruritis   . Latex Hives,  Itching and Rash    Social History   Socioeconomic History  . Marital status: Married    Spouse name: Not on file  . Number of children: Not on file  . Years of education: Not on file  . Highest education level: Not on file  Occupational History  . Not on file  Tobacco Use  . Smoking status: Never Smoker  . Smokeless tobacco: Never Used  Vaping Use  . Vaping Use: Never used  Substance and Sexual Activity  . Alcohol use: No  . Drug use: No  . Sexual activity: Yes  Partners: Male    Birth control/protection: Post-menopausal  Other Topics Concern  . Not on file  Social History Narrative  . Not on file   Social Determinants of Health   Financial Resource Strain:   . Difficulty of Paying Living Expenses:   Food Insecurity:   . Worried About Charity fundraiser in the Last Year:   . Arboriculturist in the Last Year:   Transportation Needs:   . Film/video editor (Medical):   [name] Lack of Transportation (Non-Medical):   Physical Activity:   . Days of Exercise per Week:   . Minutes of Exercise per Session:   Stress:   . Feeling of Stress :   Social Connections:   . Frequency of Communication with Friends and Family:   . Frequency of Social Gatherings with Friends and Family:   . Attends Religious Services:   . Active Member of Clubs or Organizations:   . Attends Archivist Meetings:   [name] Marital Status:     Vitals:   04/20/20 0842  BP: 130/80  Pulse: 86  Resp: 12  Temp: (!) 97 F (36.1 C)  SpO2: 98%   Body mass index is 28.71 kg/m.  Physical Exam  Nursing note and vitals reviewed. Constitutional: She is oriented to person, place, and time. She appears well-developed. No distress.  HENT:  Head: Normocephalic and atraumatic.  Eyes: Pupils are equal, round, and reactive to light. Conjunctivae are normal.  Cardiovascular: Normal rate and regular rhythm.  No murmur heard. Pulses:      Dorsalis pedis pulses are 2+ on the right side and 2+ on the  left side.  Respiratory: Effort normal and breath sounds normal. No respiratory distress.  GI: Soft. She exhibits no mass. There is no hepatomegaly. There is no abdominal tenderness.  Lymphadenopathy:    She has no cervical adenopathy.  Neurological: She is alert and oriented to person, place, and time. No cranial nerve deficit.  Gait assisted with walker, antalgic. Right drop foot, brace in place.  Skin: Skin is warm. No rash noted. No erythema.  Psychiatric: Her mood appears anxious. She exhibits a depressed mood.  Well groomed, good eye contact.   ASSESSMENT AND PLAN:   [name] was seen today for chronic disease management.  Orders Placed This Encounter  Procedures  . Hepatitis C antibody  . Hepatic function panel  . Lipid panel   Lab Results  Component Value Date   CHOL 198 04/20/2020   HDL 61.70 04/20/2020   LDLCALC 123 (H) 04/20/2020   TRIG 62.0 04/20/2020   CHOLHDL 3 04/20/2020   Lab Results  Component Value Date   ALT 19 04/20/2020   AST 16 04/20/2020   ALKPHOS 70 04/20/2020   BILITOT 0.4 04/20/2020   Radicular pain After discussion of some benefits and side effects, she would like to try Cymbalta, starting with 30 mg daily. Continue Gabapentin 300 mg qid. Stop Tramadol, we discussed risk of med interaction. Following with ortho and pending appt with Dr Nelva Bush for pain management.   - DULoxetine (CYMBALTA) 30 MG capsule; Take 1 capsule (30 mg total) by mouth daily.  Dispense: 30 capsule; Refill: 1  Encounter for HCV screening test for low risk patient - Hepatitis C antibody  Mild episode of recurrent major depressive disorder (HCC) PHQ score 10. Cymbalta 30 mg started today. Some side effects discussed. F/U in 5-6 weeks,before if needed.  Hyperlipidemia Continue Atorvastatin 20 mg daily and low  fat diet. Medication dose will be adjusted, if needed,according to lipid panel results.  Essential hypertension BP adequately controlled. No  changes in current management.  Anxiety disorder, unspecified Not well controlled. She agrees with trying Cymbalta 30 mg daily. Some side effects discussed.   Return in about 4 months (around 08/20/2020) for HTN,mood..   Lyanne Kates G. [name], MD  [facility]. [place] office.   A few things to remember from today's visit:   Essential hypertension  Hyperlipidemia, unspecified hyperlipidemia type - Plan: Hepatic function panel, Lipid panel  Depression with anxiety - Plan: DULoxetine (CYMBALTA) 30 MG capsule  Radicular pain - Plan: DULoxetine (CYMBALTA) 30 MG capsule  Encounter for HCV screening test for low risk patient - Plan: Hepatitis C antibody  Cymbalta started today.This medication could interact with Tramadol,so if not helping you can discontinue it (Tramadol).  Please let me know in 4-6 weeks how are you tolerating Cymbalta, we could increase dose to 60 mg.  If you need refills please call your pharmacy. Do not use My Chart to request refills or for acute issues that need immediate attention.    Please be sure medication list is accurate. If a new problem present, please set up appointment sooner than planned today.

## 2020-04-22 ENCOUNTER — Other Ambulatory Visit: Payer: Self-pay | Admitting: Family Medicine

## 2020-04-22 MED ORDER — ATORVASTATIN CALCIUM 20 MG PO TABS: 20.0000 mg | ORAL_TABLET | Freq: Every day | ORAL | 3 refills | Status: DC

## 2020-04-23 LAB — HEPATITIS C ANTIBODY
Hepatitis C Ab: NONREACTIVE
SIGNAL TO CUT-OFF: 0.05 (ref <1.00)

## 2020-04-23 MED FILL — ATORVASTATIN 20 MG TABLET: 20 | 90 days supply | Qty: 90 | Fill #0

## 2020-04-25 ENCOUNTER — Ambulatory Visit
Admission: RE | Admit: 2020-04-25 | Discharge: 2020-04-25 | Disposition: A | Discharge status: Home / Self Care | Payer: BC Managed Care – PPO | Source: Ambulatory Visit | Attending: Obstetrics and Gynecology | Admitting: Obstetrics and Gynecology

## 2020-04-25 ENCOUNTER — Other Ambulatory Visit: Payer: Self-pay

## 2020-04-25 DIAGNOSIS — Z1231 Encounter for screening mammogram for malignant neoplasm of breast: Secondary | ICD-10-CM

## 2020-04-25 MED FILL — GABAPENTIN 400 MG CAPSULE: 400 | 30 days supply | Qty: 90 | Fill #2

## 2020-05-21 MED FILL — FEMRING 0.05 MG/24HR RING: 0.05 | 90 days supply | Qty: 1 | Fill #2

## 2020-06-06 ENCOUNTER — Other Ambulatory Visit: Payer: Self-pay | Admitting: Family Medicine

## 2020-06-06 MED FILL — CARVEDILOL 6.25 MG TABLET: 6.25 | 90 days supply | Qty: 180 | Fill #0

## 2020-06-12 MED FILL — GABAPENTIN 400 MG CAPSULE: 400 | 30 days supply | Qty: 90 | Fill #3

## 2020-06-25 ENCOUNTER — Other Ambulatory Visit: Payer: Self-pay

## 2020-06-25 ENCOUNTER — Ambulatory Visit: Payer: BC Managed Care – PPO | Attending: Registered Nurse | Admitting: Physical Therapy

## 2020-06-25 DIAGNOSIS — R2689 Other abnormalities of gait and mobility: Secondary | ICD-10-CM | POA: Insufficient documentation

## 2020-06-25 DIAGNOSIS — R2681 Unsteadiness on feet: Secondary | ICD-10-CM | POA: Diagnosis not present

## 2020-06-25 DIAGNOSIS — M6281 Muscle weakness (generalized): Secondary | ICD-10-CM | POA: Insufficient documentation

## 2020-06-26 ENCOUNTER — Encounter: Payer: Self-pay | Admitting: Physical Therapy

## 2020-06-26 NOTE — Therapy (Signed)
[facility] [address] [place], [name], [address] Phone: [phone]   Fax:  [phone]  Physical Therapy Treatment  Patient Details  Name: [name] MRN: [id number] Date of Birth: June 01, 1961 Referring Provider (PT): Danella Sensing, NP   Encounter Date: 06/25/2020   PT End of Session - 06/26/20 1520    Visit Number 9    Number of Visits 9    Date for PT Re-Evaluation 03/13/20    Authorization Type BCBS    Authorization - Visit Number 9    PT Start Time 7425    PT Stop Time 1630    PT Time Calculation (min) 45 min    Equipment Utilized During Treatment Other (comment)   flotation belt, pool noodle   Activity Tolerance Patient tolerated treatment well    Behavior During Therapy [facility] for tasks assessed/performed           Past Medical History:  Diagnosis Date  . Anxiety   . Back pain    related to spinal stenosis and disc problem, radiates down left buttocks to leg., weakness occ.  [name] Dyspnea   . Headache   . Hyperlipidemia   . Hypertension   . Lumbar post-laminectomy syndrome   . PONV (postoperative nausea and vomiting)   . Vaginal foreign object    "Uses Femring"    Past Surgical History:  Procedure Laterality Date  . ABDOMINAL HYSTERECTOMY    . CARDIAC CATHETERIZATION N/A 04/18/2015   Procedure: Left Heart Cath and Coronary Angiography;  Surgeon: [name], MD;  Location: [facility];  Service: Cardiovascular;  Laterality: N/A;  . COLONOSCOPY W/ BIOPSIES AND POLYPECTOMY    . FOOT SURGERY Bilateral    Shattuck "bunion,bone spur, tendon" (1) -6'16, (1)-10'16  . IR EPIDUROGRAPHY  07/21/2018  . LUMBAR LAMINECTOMY/DECOMPRESSION MICRODISCECTOMY Bilateral 12/28/2015   Procedure: MICRO LUMBAR DECOMPRESSION L4 - L5 BILATERALLY;  Surgeon: [name], MD;  Location: [facility];  Service: Orthopedics;  Laterality: Bilateral;  . LUMBAR LAMINECTOMY/DECOMPRESSION MICRODISCECTOMY  Bilateral 03/04/2018   Procedure: Revision of Microlumbar Decompression Bilateral Lumbar Four-Five;  Surgeon: [name], MD;  Location: [facility];  Service: Orthopedics;  Laterality: Bilateral;  90 mins  . SPINAL CORD STIMULATOR INSERTION N/A 09/28/2019   Procedure: THORACIC SPINAL CORD STIMULATOR INSERTION;  Surgeon: Melina Schools, MD;  Location: [facility];  Service: Orthopedics;  Laterality: N/A;  2.5 hrs  . TUBAL LIGATION    . WISDOM TOOTH EXTRACTION    . WOUND EXPLORATION N/A 03/04/2018   Procedure: EXPLORATION OF LUMBAR DECOMPRESSION WOUND;  Surgeon: [name], MD;  Location: [facility];  Service: Orthopedics;  Laterality: N/A;    There were no vitals filed for this visit.   Subjective Assessment - 06/26/20 1516    Subjective Pt has not been seen for aquatic PT since 03-20-20 due to pain stimulator implant revision surgery on 03-30-20 and due to PT being out on medical leave 05-01-20 - 06-07-20.  Pt amb. with rollator without AFO on RLE; states she took soemthing for pain prior to today's appt.    Patient Stated Goals improve leg strength and balance through aquatic therapy    Currently in Pain? Yes    Pain Score 5     Pain Location Back    Pain Orientation Left;Lower    Pain Descriptors / Indicators Discomfort;Tightness;Cramping    Pain Type Chronic pain    Pain Onset More than a month ago    Pain Frequency Constant  Aquatic therapy at [facility] - pool temp 87.4 degrees   Patient seen for aquatic therapy today.  Treatment took place in water 3.5-4 feet deep depending upon activity.  Pt entered and  Exited the pool via ramp negotiation with use of bilateral hand rails with supervision.  Pt performed runner's stretch on each leg and heel cord stretch bil. LE's with feet on pool wall - 30 sec hold each stretch x 1 rep Pt performed amb. 43m  across pool approx. 4 reps   Pt performed standing hip flexion, abduction and hip extension with knee extended -  10 reps each leg with UE  support with use of ankle cuffs for increased resistance for strengthening  Squats x 10 reps with bil. UE support on pool edge  Pt requires aquatic therapy for the unweighting provided by the buoyancy of the water and needs the viscosity for strengthening LE's and trunk musc.; buoyancy of water needed for spinal decompression for increased tolerance for movement with less pain experienced than with land exercises                                  PT Short Term Goals - 06/26/20 1521      PT SHORT TERM GOAL #1   Title Pt will report decreased pain in low and Rt hip area after aquatic therapy to </= 3/10 for increased ease with mobility.    Baseline 4/10 on 01-12-20    Time 4    Period Weeks    Status New    Target Date 02/24/20   target date extended due to wait list for aquatic therapy     PT SHORT TERM GOAL #2   Title Pt will report ability to amb. with 1 forearm crutch for 25' in home environment.    Time 4    Period Weeks    Status New    Target Date 02/24/20             PT Long Term Goals - 06/26/20 1522      PT LONG TERM GOAL #1   Title Pt will be independent in HEP for aquatic exercises to be continued upon discharge from PT.    Time 8    Period Weeks    Status On-going    Target Date 08/24/20      PT LONG TERM GOAL #2   Title Pt will report 50% decrease in pain (</= 2/10 intensity) in low back/Rt hip region for increased comfort and ease with mobility and ADL's.    Baseline 4/10 intensity on 01-12-20    Time 8    Period Weeks    Status On-going    Target Date 08/24/20                 Plan - 06/26/20 1523    Clinical Impression Statement Pt had 2 occurrences of spasms/cramps in RLE resulting in LOB with pt requiring max assist for recovery to assist to upright standing due to RLE spasming forward in front of her resulting in LOB.    Personal Factors and Comorbidities Past/Current Experience;Comorbidity 2;Time since onset of  injury/illness/exacerbation    Examination-Activity Limitations Stand;Locomotion Level;Bend;Squat;Stairs;Transfers;Carry    Examination-Participation Restrictions Cleaning;Community Activity;Meal Prep;Shop    Stability/Clinical Decision Making Stable/Uncomplicated    Rehab Potential Good    PT Frequency 1x / week    PT Duration 8 weeks    PT Treatment/Interventions Manual  techniques;Patient/family education;Therapeutic exercise;Therapeutic activities;Gait training;ADLs/Self Care Home Management;Neuromuscular re-education;Functional mobility training;Passive range of motion;Balance training;Aquatic Therapy;Biofeedback;DME Instruction;Stair training;Orthotic Fit/Training;Dry needling    PT Next Visit Plan cont aquatic therapy    Consulted and Agree with Plan of Care Patient           Patient will benefit from skilled therapeutic intervention in order to improve the following deficits and impairments:  Pain, Postural dysfunction, Difficulty walking, Decreased strength, Decreased activity tolerance, Abnormal gait, Increased muscle spasms, Decreased endurance, Decreased balance, Decreased coordination, Decreased knowledge of use of DME, Decreased mobility, Decreased safety awareness, Impaired sensation, Impaired tone  Visit Diagnosis: Other abnormalities of gait and mobility  Unsteadiness on feet  Muscle weakness (generalized)     Problem List Patient Active Problem List   Diagnosis Date Noted  . Chronic pain 09/28/2019  . Lumbar spinal stenosis   . Radicular pain   . Gait disorder   . Difficulty in urination 07/13/2018  . Back pain 07/12/2018  . Hyperlipidemia 04/16/2018  . Anxiety disorder, unspecified 04/16/2018  . AKI (acute kidney injury) ([place])   . Benign essential HTN   . Major depression, recurrent ([place])   . Lumbar radiculopathy 03/09/2018  . Paraparesis ([place]) 03/09/2018  . Essential hypertension   . Chronic nonintractable headache   . Leukocytosis   . Acute blood loss  anemia   . Postoperative pain   . Generalized weakness   . Spinal stenosis at L4-L5 level 03/04/2018  . Spinal stenosis of lumbar region 12/28/2015  . Chest pain 04/16/2015    Kenidee Cregan, [place], PT, [place] 06/26/2020, 3:30 PM  [facility] [address] [place], [name], [address] Phone: [phone]   Fax:  [phone]  Name: Aviela Blundell MRN: [id number] Date of Birth: 1961/07/30

## 2020-07-04 ENCOUNTER — Encounter: Payer: BC Managed Care – PPO | Admitting: Physical Medicine & Rehabilitation

## 2020-07-09 ENCOUNTER — Other Ambulatory Visit: Payer: Self-pay

## 2020-07-09 ENCOUNTER — Ambulatory Visit: Payer: BC Managed Care – PPO | Attending: Registered Nurse | Admitting: Physical Therapy

## 2020-07-09 ENCOUNTER — Other Ambulatory Visit (HOSPITAL_COMMUNITY): Payer: Self-pay | Admitting: Physical Medicine and Rehabilitation

## 2020-07-09 DIAGNOSIS — R2689 Other abnormalities of gait and mobility: Secondary | ICD-10-CM | POA: Diagnosis present

## 2020-07-09 DIAGNOSIS — R2681 Unsteadiness on feet: Secondary | ICD-10-CM | POA: Insufficient documentation

## 2020-07-09 DIAGNOSIS — R29818 Other symptoms and signs involving the nervous system: Secondary | ICD-10-CM

## 2020-07-09 DIAGNOSIS — M6281 Muscle weakness (generalized): Secondary | ICD-10-CM | POA: Insufficient documentation

## 2020-07-09 MED FILL — BACLOFEN 10 MG TABS: 10 | 30 days supply | Qty: 90 | Fill #2

## 2020-07-09 MED FILL — GABAPENTIN 400 MG CAPSULE: 400 | 30 days supply | Qty: 90 | Fill #0

## 2020-07-10 NOTE — Therapy (Signed)
[facility] [address] [place], [name], [address] Phone: [phone]   Fax:  [phone]  Physical Therapy Treatment  Patient Details  Name: [name] MRN: [id number] Date of Birth: Oct 29, 1960 Referring Provider (PT): Danella Sensing, NP   Encounter Date: 07/09/2020   PT End of Session - 07/10/20 2031    Visit Number 10    Number of Visits 10    Date for PT Re-Evaluation 03/13/20    Authorization Type BCBS    Authorization - Visit Number 9    PT Start Time 6712    PT Stop Time 1630    PT Time Calculation (min) 45 min    Equipment Utilized During Treatment Other (comment)   pool noodle   Activity Tolerance Patient tolerated treatment well    Behavior During Therapy [facility] for tasks assessed/performed           Past Medical History:  Diagnosis Date  . Anxiety   . Back pain    related to spinal stenosis and disc problem, radiates down left buttocks to leg., weakness occ.  [name] Dyspnea   . Headache   . Hyperlipidemia   . Hypertension   . Lumbar post-laminectomy syndrome   . PONV (postoperative nausea and vomiting)   . Vaginal foreign object    "Uses Femring"    Past Surgical History:  Procedure Laterality Date  . ABDOMINAL HYSTERECTOMY    . CARDIAC CATHETERIZATION N/A 04/18/2015   Procedure: Left Heart Cath and Coronary Angiography;  Surgeon: [name], MD;  Location: [facility];  Service: Cardiovascular;  Laterality: N/A;  . COLONOSCOPY W/ BIOPSIES AND POLYPECTOMY    . FOOT SURGERY Bilateral    [place] "bunion,bone spur, tendon" (1) -6'16, (1)-10'16  . IR EPIDUROGRAPHY  07/21/2018  . LUMBAR LAMINECTOMY/DECOMPRESSION MICRODISCECTOMY Bilateral 12/28/2015   Procedure: MICRO LUMBAR DECOMPRESSION L4 - L5 BILATERALLY;  Surgeon: [name], MD;  [facility];  Service: Orthopedics;  Laterality: Bilateral;  . LUMBAR LAMINECTOMY/DECOMPRESSION MICRODISCECTOMY Bilateral 03/04/2018    Procedure: Revision of Microlumbar Decompression Bilateral Lumbar Four-Five;  Surgeon: [name], MD;  Location: [facility];  Service: Orthopedics;  Laterality: Bilateral;  90 mins  . SPINAL CORD STIMULATOR INSERTION N/A 09/28/2019   Procedure: THORACIC SPINAL CORD STIMULATOR INSERTION;  Surgeon: Melina Schools, MD;  Location: [facility];  Service: Orthopedics;  Laterality: N/A;  2.5 hrs  . TUBAL LIGATION    . WISDOM TOOTH EXTRACTION    . WOUND EXPLORATION N/A 03/04/2018   Procedure: EXPLORATION OF LUMBAR DECOMPRESSION WOUND;  Surgeon: [name], MD;  Location: [facility];  Service: Orthopedics;  Laterality: N/A;    There were no vitals filed for this visit.   Subjective Assessment - 07/10/20 2025    Subjective Pt states she feels her Rt leg is a little stronger today than it was at last aquatic session 2 weeks ago; pt reporting some discomfort in Rt hip at stimulator implant site - says it feels like something is sticking into her - has contacted MD about this sensation    Patient Stated Goals improve leg strength and balance through aquatic therapy    Currently in Pain? Yes    Pain Score 5     Pain Location Hip    Pain Orientation Right    Pain Descriptors / Indicators Stabbing;Jabbing    Pain Type Acute pain    Pain Onset 1 to 4 weeks ago    Pain Frequency Intermittent  Aquatic therapy at [facility] - pool temp 87.6 degrees   Patient seen for aquatic therapy today.  Treatment took place in water 3.5-4 feet deep depending upon activity.  Pt entered and  Exited the pool via ramp negotiation with use of bilateral hand rails with supervision.  Pt performed runner's stretch on each leg and heel cord stretch bil. LE's with feet on pool wall - 30 sec hold each stretch x 1 rep Pt performed amb. 16m  across pool approx. 6 reps with bil. UE support on pool noodle, stabilized by PT for support & stability for balance  Pt performed standing hip flexion, abduction and hip extension with  knee extended -  10 reps each leg with UE support for strengthening (no ankle cuffs used)   Marching in place 10 reps each leg with bil. UE support on PT's forearms  Squats x 10 reps with bil. UE support on pool edge  Pt requires aquatic therapy for the unweighting provided by the buoyancy of the water and needs the viscosity for strengthening LE's and trunk musc.; buoyancy of water needed for spinal decompression for increased tolerance for movement with less pain experienced than with land exercises                               PT Short Term Goals - 07/10/20 2034      PT SHORT TERM GOAL #1   Title Pt will report decreased pain in low and Rt hip area after aquatic therapy to </= 3/10 for increased ease with mobility.    Baseline 4/10 on 01-12-20    Time 4    Period Weeks    Status New    Target Date 02/24/20   target date extended due to wait list for aquatic therapy     PT SHORT TERM GOAL #2   Title Pt will report ability to amb. with 1 forearm crutch for 25' in home environment.    Time 4    Period Weeks    Status New    Target Date 02/24/20             PT Long Term Goals - 07/10/20 2034      PT LONG TERM GOAL #1   Title Pt will be independent in HEP for aquatic exercises to be continued upon discharge from PT.    Time 8    Period Weeks    Status On-going      PT LONG TERM GOAL #2   Title Pt will report 50% decrease in pain (</= 2/10 intensity) in low back/Rt hip region for increased comfort and ease with mobility and ADL's.    Baseline 4/10 intensity on 01-12-20    Time 8    Period Weeks    Status On-going                 Plan - 07/10/20 2032    Clinical Impression Statement Pt did better performing water walking and RLE strengthening exercises in today's session, compared to that in previous session 2 weeks ago; pt had only 1 occurrence of RLE giving way with max assist needed for recovery of LOB.  Cont with POC.    Personal  Factors and Comorbidities Past/Current Experience;Comorbidity 2;Time since onset of injury/illness/exacerbation    Examination-Activity Limitations Stand;Locomotion Level;Bend;Squat;Stairs;Transfers;Carry    Examination-Participation Restrictions Cleaning;Community Activity;Meal Prep;Shop    Stability/Clinical Decision Making Stable/Uncomplicated    Rehab Potential Good  PT Frequency 1x / week    PT Duration 8 weeks    PT Treatment/Interventions Manual techniques;Patient/family education;Therapeutic exercise;Therapeutic activities;Gait training;ADLs/Self Care Home Management;Neuromuscular re-education;Functional mobility training;Passive range of motion;Balance training;Aquatic Therapy;Biofeedback;DME Instruction;Stair training;Orthotic Fit/Training;Dry needling    PT Next Visit Plan cont aquatic therapy    Consulted and Agree with Plan of Care Patient           Patient will benefit from skilled therapeutic intervention in order to improve the following deficits and impairments:  Pain, Postural dysfunction, Difficulty walking, Decreased strength, Decreased activity tolerance, Abnormal gait, Increased muscle spasms, Decreased endurance, Decreased balance, Decreased coordination, Decreased knowledge of use of DME, Decreased mobility, Decreased safety awareness, Impaired sensation, Impaired tone  Visit Diagnosis: Other abnormalities of gait and mobility  Unsteadiness on feet  Muscle weakness (generalized)  Other symptoms and signs involving the nervous system     Problem List Patient Active Problem List   Diagnosis Date Noted  . Chronic pain 09/28/2019  . Lumbar spinal stenosis   . Radicular pain   . Gait disorder   . Difficulty in urination 07/13/2018  . Back pain 07/12/2018  . Hyperlipidemia 04/16/2018  . Anxiety disorder, unspecified 04/16/2018  . AKI (acute kidney injury) ([place])   . Benign essential HTN   . Major depression, recurrent ([place])   . Lumbar radiculopathy  03/09/2018  . Paraparesis ([place]) 03/09/2018  . Essential hypertension   . Chronic nonintractable headache   . Leukocytosis   . Acute blood loss anemia   . Postoperative pain   . Generalized weakness   . Spinal stenosis at L4-L5 level 03/04/2018  . Spinal stenosis of lumbar region 12/28/2015  . Chest pain 04/16/2015    [place], [place], [place] 07/10/2020, 8:36 PM  [facility] [address] [place], [name], [address] Phone: [phone]   Fax:  [phone]  Name: [name] MRN: [id number] Date of Birth: 1961/08/26

## 2020-07-23 ENCOUNTER — Ambulatory Visit: Payer: BC Managed Care – PPO | Admitting: Physical Therapy

## 2020-07-23 ENCOUNTER — Other Ambulatory Visit: Payer: Self-pay

## 2020-07-23 DIAGNOSIS — R2689 Other abnormalities of gait and mobility: Secondary | ICD-10-CM | POA: Diagnosis not present

## 2020-07-23 DIAGNOSIS — R2681 Unsteadiness on feet: Secondary | ICD-10-CM

## 2020-07-23 DIAGNOSIS — M6281 Muscle weakness (generalized): Secondary | ICD-10-CM

## 2020-07-24 ENCOUNTER — Encounter: Payer: Self-pay | Admitting: Physical Therapy

## 2020-07-24 NOTE — Therapy (Signed)
[facility] [address] [name], [name], [address] Phone: [phone]   Fax:  [phone]  Physical Therapy Treatment  Patient Details  Name: [name] MRN: [id number] Date of Birth: Jan 20, 1961 Referring Provider (PT): Danella Sensing, NP   Encounter Date: 07/23/2020   PT End of Session - 07/24/20 1958    Visit Number 11    Number of Visits 10    Date for PT Re-Evaluation 07/30/20    Authorization Type BCBS    Authorization - Visit Number 9    PT Start Time 1751    PT Stop Time 1642    PT Time Calculation (min) 57 min    Equipment Utilized During Treatment Other (comment)   pool noodle   Activity Tolerance Patient tolerated treatment well    Behavior During Therapy [facility] for tasks assessed/performed           Past Medical History:  Diagnosis Date  . Anxiety   . Back pain    related to spinal stenosis and disc problem, radiates down left buttocks to leg., weakness occ.  [name] Dyspnea   . Headache   . Hyperlipidemia   . Hypertension   . Lumbar post-laminectomy syndrome   . PONV (postoperative nausea and vomiting)   . Vaginal foreign object    "Uses Femring"    Past Surgical History:  Procedure Laterality Date  . ABDOMINAL HYSTERECTOMY    . CARDIAC CATHETERIZATION N/A 04/18/2015   Procedure: Left Heart Cath and Coronary Angiography;  Surgeon: [name], MD;  Location: [facility];  Service: Cardiovascular;  Laterality: N/A;  . COLONOSCOPY W/ BIOPSIES AND POLYPECTOMY    . FOOT SURGERY Bilateral    [place] "bunion,bone spur, tendon" (1) -6'16, (1)-10'16  . IR EPIDUROGRAPHY  07/21/2018  . LUMBAR LAMINECTOMY/DECOMPRESSION MICRODISCECTOMY Bilateral 12/28/2015   Procedure: MICRO LUMBAR DECOMPRESSION L4 - L5 BILATERALLY;  Surgeon: [name], MD;  [facility];  Service: Orthopedics;  Laterality: Bilateral;  . LUMBAR LAMINECTOMY/DECOMPRESSION MICRODISCECTOMY Bilateral 03/04/2018    Procedure: Revision of Microlumbar Decompression Bilateral Lumbar Four-Five;  Surgeon: [name], MD;  Location: [facility];  Service: Orthopedics;  Laterality: Bilateral;  90 mins  . SPINAL CORD STIMULATOR INSERTION N/A 09/28/2019   Procedure: THORACIC SPINAL CORD STIMULATOR INSERTION;  Surgeon: Melina Schools, MD;  Location: [facility];  Service: Orthopedics;  Laterality: N/A;  2.5 hrs  . TUBAL LIGATION    . WISDOM TOOTH EXTRACTION    . WOUND EXPLORATION N/A 03/04/2018   Procedure: EXPLORATION OF LUMBAR DECOMPRESSION WOUND;  Surgeon: [name], MD;  Location: [facility];  Service: Orthopedics;  Laterality: N/A;    There were no vitals filed for this visit.   Subjective Assessment - 07/24/20 1948    Subjective Pt presents for aquatic therapy at [facility] - states she has appt with Dr. Nelva Bush next week and will also see Dr. Rolena Infante at that appt    Currently in Pain? Yes    Pain Score 5     Pain Location Back    Pain Orientation Right;Lower    Pain Descriptors / Indicators Aching;Spasm;Discomfort    Pain Type Chronic pain    Pain Onset 1 to 4 weeks ago    Pain Frequency Intermittent                 Aquatic therapy at [facility] - pool temp 87.6 degrees   Patient seen for aquatic therapy today.  Treatment took place in water 3.5-4 feet  deep depending upon activity.  Pt entered and  Exited the pool via ramp negotiation with use of bilateral hand rails with supervision.  Pt performed runner's stretch on each leg and heel cord stretch bil. LE's with feet on pool wall - 30 sec hold each stretch x 1 rep Pt performed amb. 1m  across pool approx. 10 reps with bil. UE support on pool noodle  Pt performed standing hip flexion, abduction and hip extension with knee extended -  10 reps each leg with UE support for increased resistance for strengthening (no ankle cuffs used due to c/o discomfort in Rt hip with Rt hip extension with use of cuff)  Squats x 10 reps with bil. UE support on pool edge  Pt  requires aquatic therapy for the unweighting provided by the buoyancy of the water and needs the viscosity for strengthening LE's and trunk musc.; buoyancy of water needed for spinal decompression for increased tolerance for movement with less pain experienced than with land exercises                                   PT Short Term Goals - 07/24/20 2005      PT SHORT TERM GOAL #1   Title Pt will report decreased pain in low and Rt hip area after aquatic therapy to </= 3/10 for increased ease with mobility.    Baseline 4/10 on 01-12-20    Time 4    Period Weeks    Status New    Target Date 02/24/20   target date extended due to wait list for aquatic therapy     PT SHORT TERM GOAL #2   Title Pt will report ability to amb. with 1 forearm crutch for 25' in home environment.    Time 4    Period Weeks    Status New    Target Date 02/24/20             PT Long Term Goals - 07/24/20 2006      PT LONG TERM GOAL #1   Title Pt will be independent in HEP for aquatic exercises to be continued upon discharge from PT.    Time 8    Period Weeks    Status On-going      PT LONG TERM GOAL #2   Title Pt will report 50% decrease in pain (</= 2/10 intensity) in low back/Rt hip region for increased comfort and ease with mobility and ADL's.    Baseline 4/10 intensity on 01-12-20    Time 8    Period Weeks    Status On-going                 Plan - 07/24/20 1959    Clinical Impression Statement Aquatic therapy session focused on gait training in 4' water depth and RLE strengthening exercises.  Attempted to use buoyant aquatic cuff on RLE for hip strengthening exercises but pt reported pain/pulling sensation with Rt hip extension with use of buoyant cuff so this was removed; pt able to perform active Rt hip extension with viscosity of water only for resistance (buoyancy resisted).  Pt had 1 occurrence of RLE giving way and floating upward, with pt requiring max  assist to return to standing position.    Personal Factors and Comorbidities Past/Current Experience;Comorbidity 2;Time since onset of injury/illness/exacerbation    Examination-Activity Limitations Stand;Locomotion Level;Bend;Squat;Stairs;Transfers;Carry    Examination-Participation Restrictions Cleaning;Community Activity;Meal Prep;Shop  Stability/Clinical Decision Making Stable/Uncomplicated    Rehab Potential Good    PT Frequency 1x / week    PT Duration 8 weeks    PT Treatment/Interventions Manual techniques;Patient/family education;Therapeutic exercise;Therapeutic activities;Gait training;ADLs/Self Care Home Management;Neuromuscular re-education;Functional mobility training;Passive range of motion;Balance training;Aquatic Therapy;Biofeedback;DME Instruction;Stair training;Orthotic Fit/Training;Dry needling;Moist Heat;Scar mobilization    PT Next Visit Plan cont aquatic therapy    Consulted and Agree with Plan of Care Patient           Patient will benefit from skilled therapeutic intervention in order to improve the following deficits and impairments:  Pain, Postural dysfunction, Difficulty walking, Decreased strength, Decreased activity tolerance, Abnormal gait, Increased muscle spasms, Decreased endurance, Decreased balance, Decreased coordination, Decreased knowledge of use of DME, Decreased mobility, Decreased safety awareness, Impaired sensation, Impaired tone  Visit Diagnosis: Other abnormalities of gait and mobility  Unsteadiness on feet  Muscle weakness (generalized)     Problem List Patient Active Problem List   Diagnosis Date Noted  . Chronic pain 09/28/2019  . Lumbar spinal stenosis   . Radicular pain   . Gait disorder   . Difficulty in urination 07/13/2018  . Back pain 07/12/2018  . Hyperlipidemia 04/16/2018  . Anxiety disorder, unspecified 04/16/2018  . AKI (acute kidney injury) ([place])   . Benign essential HTN   . Major depression, recurrent ([place])   .  Lumbar radiculopathy 03/09/2018  . Paraparesis ([place]) 03/09/2018  . Essential hypertension   . Chronic nonintractable headache   . Leukocytosis   . Acute blood loss anemia   . Postoperative pain   . Generalized weakness   . Spinal stenosis at L4-L5 level 03/04/2018  . Spinal stenosis of lumbar region 12/28/2015  . Chest pain 04/16/2015    Kanen Mottola, [place], PT, [place] 07/24/2020, 8:11 PM  [facility] [address] [place], [name], [address] Phone: [phone]   Fax:  [phone]  Name: Zachary Lovins MRN: [id number] Date of Birth: 12-07-1960

## 2020-07-30 ENCOUNTER — Other Ambulatory Visit: Payer: Self-pay

## 2020-07-30 ENCOUNTER — Ambulatory Visit: Payer: PPO | Attending: Registered Nurse | Admitting: Physical Therapy

## 2020-07-30 ENCOUNTER — Other Ambulatory Visit (HOSPITAL_COMMUNITY): Payer: Self-pay | Admitting: Chiropractic Medicine

## 2020-07-30 DIAGNOSIS — M6281 Muscle weakness (generalized): Secondary | ICD-10-CM | POA: Insufficient documentation

## 2020-07-30 DIAGNOSIS — M5442 Lumbago with sciatica, left side: Secondary | ICD-10-CM | POA: Diagnosis not present

## 2020-07-30 DIAGNOSIS — R29818 Other symptoms and signs involving the nervous system: Secondary | ICD-10-CM | POA: Insufficient documentation

## 2020-07-30 DIAGNOSIS — R2681 Unsteadiness on feet: Secondary | ICD-10-CM | POA: Insufficient documentation

## 2020-07-30 DIAGNOSIS — G8929 Other chronic pain: Secondary | ICD-10-CM | POA: Diagnosis not present

## 2020-07-30 DIAGNOSIS — R2689 Other abnormalities of gait and mobility: Secondary | ICD-10-CM

## 2020-07-30 DIAGNOSIS — M5416 Radiculopathy, lumbar region: Secondary | ICD-10-CM | POA: Diagnosis not present

## 2020-07-30 MED FILL — traMADol HCL 50 MG TABS: 50 | 30 days supply | Qty: 90 | Fill #0

## 2020-07-30 MED FILL — METHOCARBAMOL 500 MG TABS: 500 | 90 days supply | Qty: 90 | Fill #0

## 2020-07-31 ENCOUNTER — Encounter: Payer: Self-pay | Admitting: Physical Therapy

## 2020-07-31 NOTE — Therapy (Signed)
[facility] [address] [place], [name], [address] Phone: [phone]   Fax:  [phone]  Physical Therapy Treatment  Patient Details  Name: [name] MRN: [id number] Date of Birth: 13-Oct-1961 Referring Provider (PT): Danella Sensing, NP   Encounter Date: 07/30/2020   PT End of Session - 07/31/20 2054    Visit Number 12   visit 1 for Medicare - visit 12 total   Number of Visits 19    Date for PT Re-Evaluation 09/29/20    Authorization Type BCBS; pt has Medicare as of 07-27-20    Authorization Time Period 07-30-20 - 10-26-20    Progress Note Due on Visit 21   would be visit #10 for Medicare   PT Start Time 1550    PT Stop Time 1635    PT Time Calculation (min) 45 min    Equipment Utilized During Treatment Other (comment)   pool noodle   Activity Tolerance Patient tolerated treatment well    Behavior During Therapy [facility] for tasks assessed/performed           Past Medical History:  Diagnosis Date  . Anxiety   . Back pain    related to spinal stenosis and disc problem, radiates down left buttocks to leg., weakness occ.  [name] Dyspnea   . Headache   . Hyperlipidemia   . Hypertension   . Lumbar post-laminectomy syndrome   . PONV (postoperative nausea and vomiting)   . Vaginal foreign object    "Uses Femring"    Past Surgical History:  Procedure Laterality Date  . ABDOMINAL HYSTERECTOMY    . CARDIAC CATHETERIZATION N/A 04/18/2015   Procedure: Left Heart Cath and Coronary Angiography;  Surgeon: [name], MD;  Location: [facility];  Service: Cardiovascular;  Laterality: N/A;  . COLONOSCOPY W/ BIOPSIES AND POLYPECTOMY    . FOOT SURGERY Bilateral    [place] "bunion,bone spur, tendon" (1) -6'16, (1)-10'16  . IR EPIDUROGRAPHY  07/21/2018  . LUMBAR LAMINECTOMY/DECOMPRESSION MICRODISCECTOMY Bilateral 12/28/2015   Procedure: MICRO LUMBAR DECOMPRESSION L4 - L5 BILATERALLY;  Surgeon:  [name], MD;  Location: [facility];  Service: Orthopedics;  Laterality: Bilateral;  . LUMBAR LAMINECTOMY/DECOMPRESSION MICRODISCECTOMY Bilateral 03/04/2018   Procedure: Revision of Microlumbar Decompression Bilateral Lumbar Four-Five;  Surgeon: [name], MD;  Location: [facility];  Service: Orthopedics;  Laterality: Bilateral;  90 mins  . SPINAL CORD STIMULATOR INSERTION N/A 09/28/2019   Procedure: THORACIC SPINAL CORD STIMULATOR INSERTION;  Surgeon: Melina Schools, MD;  Location: [facility];  Service: Orthopedics;  Laterality: N/A;  2.5 hrs  . TUBAL LIGATION    . WISDOM TOOTH EXTRACTION    . WOUND EXPLORATION N/A 03/04/2018   Procedure: EXPLORATION OF LUMBAR DECOMPRESSION WOUND;  Surgeon: [name], MD;  Location: [facility];  Service: Orthopedics;  Laterality: N/A;    There were no vitals filed for this visit.   Subjective Assessment - 07/31/20 2048    Subjective Pt presents for aquatic therapy at [facility] - states she saw the PA for Dr. Rolena Infante last week - says she recommended that she get back into therapy (land) in addition to the aquatic PT    Patient Stated Goals improve leg strength and balance through aquatic therapy    Currently in Pain? Yes    Pain Score 5     Pain Location Back    Pain Orientation Right;Lower    Pain Descriptors / Indicators Aching;Discomfort;Spasm    Pain Type Chronic pain  Pain Onset More than a month ago              Aquatic therapy at [facility] - pool temp 87.6 degrees   Patient seen for aquatic therapy today.  Treatment took place in water 3.5-4 feet deep depending upon activity.  Pt entered and  Exited the pool via ramp negotiation with use of bilateral hand rails with supervision.  Pt performed runner's stretch on each leg and heel cord stretch bil. LE's with feet on pool wall - 30 sec hold each stretch x 1 rep Pt performed amb. 88m  across pool approx. 6 reps with bil. UE support on pool noodle, stabilized by PT for support & stability for balance  Pt  performed standing hip flexion, abduction and hip extension with knee extended -  10 reps each leg with UE support for strengthening (no ankle cuffs used)   Marching in place 10 reps each leg with bil. UE support on pool edge  Squats x 10 reps with bil. UE support on pool edge  Pt requires aquatic therapy for the unweighting provided by the buoyancy of the water and needs the viscosity for strengthening LE's and trunk musc.; buoyancy of water needed for spinal decompression for increased tolerance for movement with less pain experienced than with land exercises                               PT Short Term Goals - 07/31/20 2109      PT SHORT TERM GOAL #1   Title Pt will report decreased pain in low and Rt hip area after aquatic therapy to </= 3/10 for increased ease with mobility.    Baseline 4/10 on 07-30-20    Time 4    Period Weeks    Status On-going    Target Date 08/27/20   target date extended due to wait list for aquatic therapy     PT SHORT TERM GOAL #2   Title Pt will negotiate steps into and out of pool with SBA  with use of hand rails using a step over step sequence.    Time 4    Period Weeks    Status New    Target Date 08/27/20             PT Long Term Goals - 07/31/20 2106      PT LONG TERM GOAL #1   Title Pt will be independent in HEP for aquatic exercises to be continued upon discharge from PT.    Time 8    Period Weeks    Status On-going    Target Date 10/01/20      PT LONG TERM GOAL #2   Title Pt will report 50% decrease in pain (</= 2/10 intensity) in low back/Rt hip region for increased comfort and ease with mobility and ADL's.    Baseline 4/10 intensity on 07-30-20    Time 8    Period Weeks    Status On-going    Target Date 10/01/20      PT LONG TERM GOAL #3   Title Perform sit to stand from bench in pool 3 times without UE support to demo increased RLE strength.    Time 8    Period Weeks    Status New    Target Date  10/01/20                 Plan - 07/31/20 2058  Clinical Impression Statement Pt improved with water walking in today's session with no occurrence of Rt knee giving way; RLE did tremor approx. 2 times but pt able to stop and restart without Rt leg giving way.  Pt declined use of aquatic cuffs on RLE for hip strengthening due to c/o "pulling sensation" in Rt hip.  Cont with POC    Personal Factors and Comorbidities Past/Current Experience;Comorbidity 2;Time since onset of injury/illness/exacerbation    Examination-Activity Limitations Stand;Locomotion Level;Bend;Squat;Stairs;Transfers;Carry    Examination-Participation Restrictions Cleaning;Community Activity;Meal Prep;Shop    Stability/Clinical Decision Making Stable/Uncomplicated    Rehab Potential Good    PT Frequency 1x / week    PT Duration 8 weeks    PT Treatment/Interventions Manual techniques;Patient/family education;Therapeutic exercise;Therapeutic activities;Gait training;ADLs/Self Care Home Management;Neuromuscular re-education;Functional mobility training;Passive range of motion;Balance training;Aquatic Therapy;Biofeedback;DME Instruction;Stair training;Orthotic Fit/Training;Dry needling;Moist Heat;Scar mobilization    PT Next Visit Plan cont aquatic therapy    Consulted and Agree with Plan of Care Patient           Patient will benefit from skilled therapeutic intervention in order to improve the following deficits and impairments:  Pain, Postural dysfunction, Difficulty walking, Decreased strength, Decreased activity tolerance, Abnormal gait, Increased muscle spasms, Decreased endurance, Decreased balance, Decreased coordination, Decreased knowledge of use of DME, Decreased mobility, Decreased safety awareness, Impaired sensation, Impaired tone  Visit Diagnosis: Other abnormalities of gait and mobility - Plan: PT plan of care cert/re-cert  Unsteadiness on feet - Plan: PT plan of care cert/re-cert  Muscle weakness  (generalized) - Plan: PT plan of care cert/re-cert  Other symptoms and signs involving the nervous system - Plan: PT plan of care cert/re-cert  Chronic left-sided low back pain with left-sided sciatica - Plan: PT plan of care cert/re-cert     Problem List Patient Active Problem List   Diagnosis Date Noted  . Chronic pain 09/28/2019  . Lumbar spinal stenosis   . Radicular pain   . Gait disorder   . Difficulty in urination 07/13/2018  . Back pain 07/12/2018  . Hyperlipidemia 04/16/2018  . Anxiety disorder, unspecified 04/16/2018  . AKI (acute kidney injury) ([place])   . Benign essential HTN   . Major depression, recurrent ([place])   . Lumbar radiculopathy 03/09/2018  . Paraparesis ([place]) 03/09/2018  . Essential hypertension   . Chronic nonintractable headache   . Leukocytosis   . Acute blood loss anemia   . Postoperative pain   . Generalized weakness   . Spinal stenosis at L4-L5 level 03/04/2018  . Spinal stenosis of lumbar region 12/28/2015  . Chest pain 04/16/2015    Maud Rubendall, [place], [place], A[facility] 07/31/2020, 9:23 PM  [facility] [address] [place], [name], [address] Phone: [phone]   Fax:  [phone]  Name: [name] MRN: [id number] Date of Birth: [birth year]

## 2020-08-06 ENCOUNTER — Ambulatory Visit: Payer: PPO | Admitting: Physical Therapy

## 2020-08-06 ENCOUNTER — Other Ambulatory Visit: Payer: Self-pay

## 2020-08-06 DIAGNOSIS — R2689 Other abnormalities of gait and mobility: Secondary | ICD-10-CM | POA: Diagnosis not present

## 2020-08-06 DIAGNOSIS — R2681 Unsteadiness on feet: Secondary | ICD-10-CM

## 2020-08-06 DIAGNOSIS — M6281 Muscle weakness (generalized): Secondary | ICD-10-CM

## 2020-08-07 NOTE — Therapy (Signed)
[facility] [address] [place], [name], [address] Phone: [phone]   Fax:  [phone]  Physical Therapy Treatment  Patient Details  Name: [name] MRN: [id number] Date of Birth: 1961-08-06 Referring Provider (PT): Danella Sensing, NP   Encounter Date: 08/06/2020   PT End of Session - 08/07/20 1930    Visit Number 13   visit 2 for Medicare   Number of Visits 19    Date for PT Re-Evaluation 09/29/20    Authorization Type BCBS; pt has Medicare as of 07-27-20    Authorization Time Period 07-30-20 - 10-26-20    Progress Note Due on Visit 21   would be visit #10 for Medicare   PT Start Time 1550    PT Stop Time 1640    PT Time Calculation (min) 50 min    Equipment Utilized During Treatment Other (comment)   pool noodle   Activity Tolerance Patient tolerated treatment well    Behavior During Therapy [facility] for tasks assessed/performed           Past Medical History:  Diagnosis Date  . Anxiety   . Back pain    related to spinal stenosis and disc problem, radiates down left buttocks to leg., weakness occ.  [name] Dyspnea   . Headache   . Hyperlipidemia   . Hypertension   . Lumbar post-laminectomy syndrome   . PONV (postoperative nausea and vomiting)   . Vaginal foreign object    "Uses Femring"    Past Surgical History:  Procedure Laterality Date  . ABDOMINAL HYSTERECTOMY    . CARDIAC CATHETERIZATION N/A 04/18/2015   Procedure: Left Heart Cath and Coronary Angiography;  Surgeon: [name], MD;  Location: [facility];  Service: Cardiovascular;  Laterality: N/A;  . COLONOSCOPY W/ BIOPSIES AND POLYPECTOMY    . FOOT SURGERY Bilateral    [place] "bunion,bone spur, tendon" (1) -6'16, (1)-10'16  . IR EPIDUROGRAPHY  07/21/2018  . LUMBAR LAMINECTOMY/DECOMPRESSION MICRODISCECTOMY Bilateral 12/28/2015   Procedure: MICRO LUMBAR DECOMPRESSION L4 - L5 BILATERALLY;  Surgeon: [name], MD;   [facility];  Service: Orthopedics;  Laterality: Bilateral;  . LUMBAR LAMINECTOMY/DECOMPRESSION MICRODISCECTOMY Bilateral 03/04/2018   Procedure: Revision of Microlumbar Decompression Bilateral Lumbar Four-Five;  Surgeon: [name], MD;  Location: [facility];  Service: Orthopedics;  Laterality: Bilateral;  90 mins  . SPINAL CORD STIMULATOR INSERTION N/A 09/28/2019   Procedure: THORACIC SPINAL CORD STIMULATOR INSERTION;  Surgeon: Melina Schools, MD;  Location: [facility];  Service: Orthopedics;  Laterality: N/A;  2.5 hrs  . TUBAL LIGATION    . WISDOM TOOTH EXTRACTION    . WOUND EXPLORATION N/A 03/04/2018   Procedure: EXPLORATION OF LUMBAR DECOMPRESSION WOUND;  Surgeon: [name], MD;  Location: [facility];  Service: Orthopedics;  Laterality: N/A;    There were no vitals filed for this visit.   Subjective Assessment - 08/07/20 1928    Subjective Pt states the aquatic exercises are helping to increase strength in her Rt leg    Patient Stated Goals improve leg strength and balance through aquatic therapy    Currently in Pain? Yes    Pain Score 3     Pain Location Back    Pain Orientation Right;Lower    Pain Descriptors / Indicators Aching;Discomfort    Pain Type Chronic pain    Pain Onset More than a month ago    Pain Frequency Intermittent  Aquatic therapy at [facility] - pool temp 87.6 degrees   Patient seen for aquatic therapy today.  Treatment took place in water 3.5-4 feet deep depending upon activity.  Pt entered and  Exited the pool via ramp negotiation with use of bilateral hand rails with supervision.  Pt performed runner's stretch on each leg and heel cord stretch bil. LE's with feet on pool wall - 30 sec hold each stretch x 1 rep Pt performed amb. 64m  across pool approx. 8  reps with bil. UE support on pool noodle  Pt performed standing hip flexion, abduction and hip extension with knee extended -  10 reps each leg with UE support for increased resistance for  strengthening (no ankle cuffs used due to c/o discomfort in Rt hip with Rt hip extension with use of cuff)  Squats x 10 reps with bil. UE support on pool edge  Pt requires aquatic therapy for the unweighting provided by the buoyancy of the water and needs the viscosity for strengthening LE's and trunk musc.; buoyancy of water needed for spinal decompression for increased tolerance for movement with less pain experienced than with land exercises                                      PT Short Term Goals - 08/07/20 1939      PT SHORT TERM GOAL #1   Title Pt will report decreased pain in low and Rt hip area after aquatic therapy to </= 3/10 for increased ease with mobility.    Baseline 4/10 on 07-30-20    Time 4    Period Weeks    Status On-going    Target Date 08/27/20   target date extended due to wait list for aquatic therapy     PT SHORT TERM GOAL #2   Title Pt will negotiate steps into and out of pool with SBA  with use of hand rails using a step over step sequence.    Time 4    Period Weeks    Status New    Target Date 08/27/20             PT Long Term Goals - 08/07/20 1939      PT LONG TERM GOAL #1   Title Pt will be independent in HEP for aquatic exercises to be continued upon discharge from PT.    Time 8    Period Weeks    Status On-going      PT LONG TERM GOAL #2   Title Pt will report 50% decrease in pain (</= 2/10 intensity) in low back/Rt hip region for increased comfort and ease with mobility and ADL's.    Baseline 4/10 intensity on 07-30-20    Time 8    Period Weeks    Status On-going      PT LONG TERM GOAL #3   Title Perform sit to stand from bench in pool 3 times without UE support to demo increased RLE strength.    Time 8    Period Weeks    Status New                 Plan - 08/07/20 1934    Clinical Impression Statement Pt demonstrating slow but steady gains in increasing RLE strength - pt able to amb. with bil.  UE support on pool noodle for assistance with ambulation.  Pt unable to perform  RLE strengthening exs. with ankle cuff due to increased discomfort in Rt hip and low back with increased resistance.  Cont with POC.    Personal Factors and Comorbidities Past/Current Experience;Comorbidity 2;Time since onset of injury/illness/exacerbation    Examination-Activity Limitations Stand;Locomotion Level;Bend;Squat;Stairs;Transfers;Carry    Examination-Participation Restrictions Cleaning;Community Activity;Meal Prep;Shop    Stability/Clinical Decision Making Stable/Uncomplicated    Rehab Potential Good    PT Frequency 1x / week    PT Duration 8 weeks    PT Treatment/Interventions Manual techniques;Patient/family education;Therapeutic exercise;Therapeutic activities;Gait training;ADLs/Self Care Home Management;Neuromuscular re-education;Functional mobility training;Passive range of motion;Balance training;Aquatic Therapy;Biofeedback;DME Instruction;Stair training;Orthotic Fit/Training;Dry needling;Moist Heat;Scar mobilization    PT Next Visit Plan cont aquatic therapy    Consulted and Agree with Plan of Care Patient           Patient will benefit from skilled therapeutic intervention in order to improve the following deficits and impairments:  Pain, Postural dysfunction, Difficulty walking, Decreased strength, Decreased activity tolerance, Abnormal gait, Increased muscle spasms, Decreased endurance, Decreased balance, Decreased coordination, Decreased knowledge of use of DME, Decreased mobility, Decreased safety awareness, Impaired sensation, Impaired tone  Visit Diagnosis: Other abnormalities of gait and mobility  Unsteadiness on feet  Muscle weakness (generalized)     Problem List Patient Active Problem List   Diagnosis Date Noted  . Chronic pain 09/28/2019  . Lumbar spinal stenosis   . Radicular pain   . Gait disorder   . Difficulty in urination 07/13/2018  . Back pain 07/12/2018  .  Hyperlipidemia 04/16/2018  . Anxiety disorder, unspecified 04/16/2018  . AKI (acute kidney injury) ([place])   . Benign essential HTN   . Major depression, recurrent ([place])   . Lumbar radiculopathy 03/09/2018  . Paraparesis ([place]) 03/09/2018  . Essential hypertension   . Chronic nonintractable headache   . Leukocytosis   . Acute blood loss anemia   . Postoperative pain   . Generalized weakness   . Spinal stenosis at L4-L5 level 03/04/2018  . Spinal stenosis of lumbar region 12/28/2015  . Chest pain 04/16/2015    Brylin Stopper, [place], [place], [place] 08/07/2020, 7:40 PM  [facility] [address] [place], [name], [address] Phone: [phone]   Fax:  [phone]  Name: Digna Countess MRN: [id number] Date of Birth: January 19, 1961

## 2020-08-10 MED FILL — ATORVASTATIN CALCIUM 20 MG: 20 | 90 days supply | Qty: 90 | Fill #1

## 2020-08-15 ENCOUNTER — Other Ambulatory Visit: Payer: Self-pay

## 2020-08-15 ENCOUNTER — Encounter: Payer: Self-pay | Admitting: Physical Medicine & Rehabilitation

## 2020-08-15 ENCOUNTER — Encounter: Payer: PPO | Attending: Physical Medicine & Rehabilitation | Admitting: Physical Medicine & Rehabilitation

## 2020-08-15 ENCOUNTER — Other Ambulatory Visit: Payer: Self-pay | Admitting: Physical Medicine & Rehabilitation

## 2020-08-15 VITALS — BP 158/90 | HR 74 | Temp 98.4°F | Wt 177.0 lb

## 2020-08-15 DIAGNOSIS — G894 Chronic pain syndrome: Secondary | ICD-10-CM

## 2020-08-15 DIAGNOSIS — M5410 Radiculopathy, site unspecified: Secondary | ICD-10-CM | POA: Insufficient documentation

## 2020-08-15 DIAGNOSIS — M48062 Spinal stenosis, lumbar region with neurogenic claudication: Secondary | ICD-10-CM | POA: Insufficient documentation

## 2020-08-15 DIAGNOSIS — R269 Unspecified abnormalities of gait and mobility: Secondary | ICD-10-CM | POA: Diagnosis not present

## 2020-08-15 MED ORDER — DULOXETINE HCL 30 MG PO CPEP: 30.0000 mg | ORAL_CAPSULE | Freq: Every day | ORAL | 2 refills | Status: DC

## 2020-08-15 MED FILL — DULoxetine HCL 30 MG CPEP: 30 | 30 days supply | Qty: 30 | Fill #0

## 2020-08-15 NOTE — Progress Notes (Signed)
Subjective:    Patient ID: [name], female    DOB: 1961-06-20, 59 y.o.   MRN: [id number]  HPI   [name] is here in follow up of her chronic pain and functional deficits. She had a spinal stimulator implanted in December of last year. She had it moved to the right side this summer as the unit itself was causing discomfort in her left back. She hasn't found that the stimulator has been as helpful for her pain as she thought it would be. She hasn't been able to come off her pain medications. She finds that PT in the water has been more helpful than anything else for her pain and strength.   She is using gabapentin 463m tid and tramadol for pain., she took herself off cymbalta. Robaxin helps with spasms at night.    Pain Inventory Average Pain 6 Pain Right Now 6 My pain is sharp and aching  In the last 24 hours, has pain interfered with the following? General activity 5 Relation with others 5 Enjoyment of life 5 What TIME of day is your pain at its worst? morning , evening and night Sleep (in general) Fair  Pain is worse with: bending, sitting, standing and some activites Pain improves with: heat/ice, therapy/exercise, pacing activities, medication and TENS Relief from Meds: 6  Family History  Problem Relation Age of Onset  . Heart attack Mother   . Lung cancer Father   . Cancer Father   . Pancreatic cancer Sister   . Breast cancer Sister 679 . Throat cancer Brother   . Multiple myeloma Sister   . Breast cancer Sister        diagnosed in her 533's . Heart attack Sister   . Stomach cancer Cousin   . Colon cancer Neg Hx    Social History   Socioeconomic History  . Marital status: Married    Spouse name: Not on file  . Number of children: Not on file  . Years of education: Not on file  . Highest education level: Not on file  Occupational History  . Not on file  Tobacco Use  . Smoking status: Never Smoker  . Smokeless tobacco: Never Used  Vaping Use    . Vaping Use: Never used  Substance and Sexual Activity  . Alcohol use: No  . Drug use: No  . Sexual activity: Yes    Partners: Male    Birth control/protection: Post-menopausal  Other Topics Concern  . Not on file  Social History Narrative  . Not on file   Social Determinants of Health   Financial Resource Strain:   . Difficulty of Paying Living Expenses: Not on file  Food Insecurity:   . Worried About RCharity fundraiserin the Last Year: Not on file  . Ran Out of Food in the Last Year: Not on file  Transportation Needs:   . Lack of Transportation (Medical): Not on file  . Lack of Transportation (Non-Medical): Not on file  Physical Activity:   . Days of Exercise per Week: Not on file  . Minutes of Exercise per Session: Not on file  Stress:   . Feeling of Stress : Not on file  Social Connections:   . Frequency of Communication with Friends and Family: Not on file  . Frequency of Social Gatherings with Friends and Family: Not on file  . Attends Religious Services: Not on file  . Active Member of Clubs or Organizations: Not on file  .  Attends Archivist Meetings: Not on file  . Marital Status: Not on file   Past Surgical History:  Procedure Laterality Date  . ABDOMINAL HYSTERECTOMY    . CARDIAC CATHETERIZATION N/A 04/18/2015   Procedure: Left Heart Cath and Coronary Angiography;  Surgeon: [name], MD;  Location: [facility];  Service: Cardiovascular;  Laterality: N/A;  . COLONOSCOPY W/ BIOPSIES AND POLYPECTOMY    . FOOT SURGERY Bilateral    [place] "bunion,bone spur, tendon" (1) -6'16, (1)-10'16  . IR EPIDUROGRAPHY  07/21/2018  . LUMBAR LAMINECTOMY/DECOMPRESSION MICRODISCECTOMY Bilateral 12/28/2015   Procedure: MICRO LUMBAR DECOMPRESSION L4 - L5 BILATERALLY;  Surgeon: [name], MD;  Location: [facility];  Service: Orthopedics;  Laterality: Bilateral;  . LUMBAR LAMINECTOMY/DECOMPRESSION MICRODISCECTOMY Bilateral 03/04/2018   Procedure:  Revision of Microlumbar Decompression Bilateral Lumbar Four-Five;  Surgeon: [name], MD;  Location: [place];  Service: Orthopedics;  Laterality: Bilateral;  90 mins  . SPINAL CORD STIMULATOR INSERTION N/A 09/28/2019   Procedure: THORACIC SPINAL CORD STIMULATOR INSERTION;  Surgeon: Melina Schools, MD;  Location: [place];  Service: Orthopedics;  Laterality: N/A;  2.5 hrs  . TUBAL LIGATION    . WISDOM TOOTH EXTRACTION    . WOUND EXPLORATION N/A 03/04/2018   Procedure: EXPLORATION OF LUMBAR DECOMPRESSION WOUND;  Surgeon: [name], MD;  Location: [place];  Service: Orthopedics;  Laterality: N/A;   Past Surgical History:  Procedure Laterality Date  . ABDOMINAL HYSTERECTOMY    . CARDIAC CATHETERIZATION N/A 04/18/2015   Procedure: Left Heart Cath and Coronary Angiography;  Surgeon: [name], MD;  Location: [facility];  Service: Cardiovascular;  Laterality: N/A;  . COLONOSCOPY W/ BIOPSIES AND POLYPECTOMY    . FOOT SURGERY Bilateral    [place] "bunion,bone spur, tendon" (1) -6'16, (1)-10'16  . IR EPIDUROGRAPHY  07/21/2018  . LUMBAR LAMINECTOMY/DECOMPRESSION MICRODISCECTOMY Bilateral 12/28/2015   Procedure: MICRO LUMBAR DECOMPRESSION L4 - L5 BILATERALLY;  Surgeon: [name], MD;  Location: [facility];  Service: Orthopedics;  Laterality: Bilateral;  . LUMBAR LAMINECTOMY/DECOMPRESSION MICRODISCECTOMY Bilateral 03/04/2018   Procedure: Revision of Microlumbar Decompression Bilateral Lumbar Four-Five;  Surgeon: [name], MD;  Location: [place];  Service: Orthopedics;  Laterality: Bilateral;  90 mins  . SPINAL CORD STIMULATOR INSERTION N/A 09/28/2019   Procedure: THORACIC SPINAL CORD STIMULATOR INSERTION;  Surgeon: Melina Schools, MD;  Location: [place];  Service: Orthopedics;  Laterality: N/A;  2.5 hrs  . TUBAL LIGATION    . WISDOM TOOTH EXTRACTION    . WOUND EXPLORATION N/A 03/04/2018   Procedure: EXPLORATION OF LUMBAR DECOMPRESSION WOUND;  Surgeon: [name], MD;  Location: [place];  Service: Orthopedics;  Laterality: N/A;   Past Medical History:  Diagnosis Date  . Anxiety   . Back pain    related to spinal stenosis and disc problem, radiates down left buttocks to leg., weakness occ.  [name] Dyspnea   . Headache   . Hyperlipidemia   . Hypertension   . Lumbar post-laminectomy syndrome   . PONV (postoperative nausea and vomiting)   . Vaginal foreign object    "Uses Femring"   BP (!) 158/90   Pulse 74   Temp 98.4 F (36.9 C)   Wt 177 lb (80.3 kg)   SpO2 98%   BMI 29.45 kg/m   Opioid Risk Score:   Fall Risk Score:  `1  Depression screen PHQ 2/9  Depression screen [facility] 2/9 04/22/2020 08/17/2018 07/05/2018 05/03/2018 04/01/2018  Decreased Interest 2 0 0  0 1  Down, Depressed, Hopeless 0 0 0 0 1  PHQ - 2 Score 2 0 0 0 2  Altered sleeping 2 - - - 1  Tired, decreased energy 2 - - - 1  Change in appetite 1 - - - 1  Feeling bad or failure about yourself  0 - - - 0  Trouble concentrating 2 - - - 0  Moving slowly or fidgety/restless 1 - - - 0  Suicidal thoughts 0 - - - 0  PHQ-9 Score 10 - - - 5  Difficult doing work/chores Very difficult - - - Somewhat difficult  Some recent data might be hidden    Review of Systems  Constitutional: Negative.   HENT: Negative.   Eyes: Negative.   Respiratory: Negative.   Cardiovascular: Negative.   Gastrointestinal: Negative.   Endocrine: Negative.   Genitourinary: Negative.   Musculoskeletal: Positive for back pain and gait problem.  Skin: Negative.   Allergic/Immunologic: Negative.   Neurological: Positive for weakness and numbness.       Tingling  Hematological: Negative.   Psychiatric/Behavioral: Negative.   All other systems reviewed and are negative.      Objective:   Physical Exam  Gen: no distress, normal appearing HEENT: oral mucosa pink and moist, NCAT Cardio: Reg rate Chest: normal effort, normal rate of breathing Abd: soft, non-distended Ext: no edema Skin: intact Neuro: RLE 2/5 to 3/5 with  cogwheeling. LLE 4/5. Decreased sensory  In both legs Musculoskeletal: LB TTP. Still tender at old stimulator site Psych: pleasant, normal affect       Assessment & Plan:  1. Post Lumbar Laminectomy / Decompression: Lumbar Radiculopathy:  -gabapentin 432m TID  -tramadol  -resume cymbalta 335mdaily  -encouraged increased activity at home. Aquatic therapy as possible. S/P Thoracic Spinal Cord Stimulator on 09/28/2019 by Dr. BrRolena Infante Dr. RaNelva Bushrescribing Tramadol.  -encouraged her to d/w vendor  stim settings 2. Anxiety. PCP Following and prescribing Alprazolam. Education Provided. Continue to Monitor.   -resume cymbalta 3069maily 3. Spasms: may continue robaxin  Fifteen minutes of face to face patient care time were spent during this visit. All questions were encouraged and answered.  Follow up with me in 4 mos .

## 2020-08-15 NOTE — Patient Instructions (Signed)
PLEASE FEEL FREE TO CALL OUR OFFICE WITH ANY PROBLEMS OR QUESTIONS ([phone])      

## 2020-08-20 ENCOUNTER — Ambulatory Visit: Payer: PPO | Admitting: Physical Therapy

## 2020-08-20 ENCOUNTER — Ambulatory Visit: Payer: BC Managed Care – PPO | Admitting: Family Medicine

## 2020-08-20 ENCOUNTER — Encounter: Payer: Self-pay | Admitting: Family Medicine

## 2020-08-20 ENCOUNTER — Ambulatory Visit (INDEPENDENT_AMBULATORY_CARE_PROVIDER_SITE_OTHER): Payer: PPO | Admitting: Family Medicine

## 2020-08-20 ENCOUNTER — Other Ambulatory Visit: Payer: Self-pay

## 2020-08-20 VITALS — BP 124/70 | HR 86 | Resp 16 | Ht 65.0 in

## 2020-08-20 DIAGNOSIS — M5410 Radiculopathy, site unspecified: Secondary | ICD-10-CM | POA: Diagnosis not present

## 2020-08-20 DIAGNOSIS — M545 Low back pain, unspecified: Secondary | ICD-10-CM

## 2020-08-20 DIAGNOSIS — I10 Essential (primary) hypertension: Secondary | ICD-10-CM | POA: Diagnosis not present

## 2020-08-20 DIAGNOSIS — Z23 Encounter for immunization: Secondary | ICD-10-CM

## 2020-08-20 DIAGNOSIS — R2681 Unsteadiness on feet: Secondary | ICD-10-CM

## 2020-08-20 DIAGNOSIS — R2689 Other abnormalities of gait and mobility: Secondary | ICD-10-CM

## 2020-08-20 DIAGNOSIS — F419 Anxiety disorder, unspecified: Secondary | ICD-10-CM

## 2020-08-20 DIAGNOSIS — M6281 Muscle weakness (generalized): Secondary | ICD-10-CM

## 2020-08-20 NOTE — Progress Notes (Signed)
HPI: [name] is a 59 y.o. female, who is here today for 4-5 follow up.   She was last seen on 04/20/20.  Chronic back pain: Following with ortho.  Since her last visit she had the stimulator moved, still having problems. It was moved because severe burning sensation. She still having lower back pain radiated RLE. Lower back pain sometimes keeps her from sleep. She takes Tramadol and Gabapentin.  Pain is exacerbated by long walking and standing.  A week ago noted new onset of  left side back pain. Pain is sharp, 8/10, and radiated to anterior aspect of thigh.  Lumbar MR on 05/25/19:  1. Multilevel lumbar spondylosis status post posterior decompression at L4-5. Findings have not significantly progressed compared to prior MRI 07/13/2018. 2. Mild-to-moderate canal stenosis and mild bilateral foraminal narrowing at L2-3. 3. Moderate-to-severe left and mild-to-moderate right foraminal narrowing at the L4-5 level. No residual canal stenosis. Water aerobics really help. She is doing once per week, hoping on increasing frequency.  She is waking up with headaches, this is a new problem. She noticed it when she was taking Baclofen. Medication was discontinued and now on Methocarbamol.Problem seemed to improved.  Negative for associated nausea,vomiting,photophobia,or focal deficit.  Anxiety: She is on Cymbalta 30 mg (also for back pain) and Alprazolam 0.25 mg daily. Medications still help. No side effects reported.  HTN: She is on Carvedilol 6.25 mg bid. BP on 08/15/20 elevated at 158/90.  Negative for CP,SOB,palpitations or edema. Lab Results  Component Value Date   CREATININE 1.10 03/28/2020   BUN 13 03/28/2020   NA 140 03/28/2020   K 4.6 03/28/2020   CL 104 03/28/2020   CO2 32 03/28/2020   Review of Systems  Constitutional: Positive for fatigue. Negative for activity change, appetite change and fever.  HENT: Negative for mouth sores and nosebleeds.     Eyes: Negative for redness and visual disturbance.  Respiratory: Negative for cough and wheezing.   Gastrointestinal: Negative for abdominal pain, nausea and vomiting.       Negative for changes in bowel habits.  Genitourinary: Negative for decreased urine volume, dysuria and hematuria.  Musculoskeletal: Positive for gait problem.  Skin: Negative for pallor and rash.  Neurological: Negative for syncope and weakness.  Psychiatric/Behavioral: Negative for confusion. The patient is nervous/anxious.   Rest of ROS, see pertinent positives sand negatives in HPI  Current Outpatient Medications on File Prior to Visit  Medication Sig Dispense Refill  . ALPRAZolam (XANAX) 0.25 MG tablet TAKE 1 TABLET BY MOUTH ONCE DAILY AS NEEDED FOR ANXIETY OR SLEEP 30 tablet 0  . atorvastatin (LIPITOR) 20 MG tablet Take 1 tablet (20 mg total) by mouth daily. 90 tablet 3  . carvedilol (COREG) 6.25 MG tablet TAKE 1 TABLET BY MOUTH TWO TIMES DAILY *NEEDS APPT WITH PCP 180 tablet 0  . diphenhydrAMINE (BENADRYL) 25 MG tablet Take 25 mg by mouth daily as needed for allergies.    . DULoxetine (CYMBALTA) 30 MG capsule Take 1 capsule (30 mg total) by mouth daily. 30 capsule 2  . gabapentin (NEURONTIN) 400 MG capsule Take 400 mg by mouth 3 (three) times daily.    . methocarbamol (ROBAXIN) 500 MG tablet methocarbamol 500 mg tablet  Take 1 tablet 3 times a day by oral route as needed for 5 days.    . ondansetron (ZOFRAN) 8 MG tablet Zofran 8 mg tablet  Take 1 tablet every 8 hours by oral route as needed for 10 days.    [name]  polyethylene glycol (MIRALAX / GLYCOLAX) packet Take 17 g by mouth daily. (Patient taking differently: Take 17 g by mouth daily as needed for mild constipation. ) 30 each 0  . traMADol (ULTRAM) 50 MG tablet Take by mouth every 6 (six) hours as needed. Dr. Nelva Bush prescribing     No current facility-administered medications on file prior to visit.   Past Medical History:  Diagnosis Date  . Anxiety   . Back  pain    related to spinal stenosis and disc problem, radiates down left buttocks to leg., weakness occ.  [name] Dyspnea   . Headache   . Hyperlipidemia   . Hypertension   . Lumbar post-laminectomy syndrome   . PONV (postoperative nausea and vomiting)   . Vaginal foreign object    "Uses Femring"   Allergies  Allergen Reactions  . Cephalosporins Anaphylaxis  . Penicillins Anaphylaxis and Hives    Did it involve swelling of the face/tongue/throat, SOB, or low BP? Yes Did it involve sudden or severe rash/hives, skin peeling, or any reaction on the inside of your mouth or nose? Yes Did you need to seek medical attention at a hospital or doctor's office? Yes When did it last happen?10+ years If all above answers are "NO", may proceed with cephalosporin use.    . Anesthetics, Amide Nausea And Vomiting    Does not know name of it. States they put it on record foot center.   . Betadine [Povidone Iodine] Other (See Comments)    "Skin Burn" caused scar on Left buttock  . Peach [Prunus Persica] Hives  . Claritin [Loratadine]     Rash, pruritis   . Latex Hives, Itching and Rash    Social History   Socioeconomic History  . Marital status: Married    Spouse name: Not on file  . Number of children: Not on file  . Years of education: Not on file  . Highest education level: Not on file  Occupational History  . Not on file  Tobacco Use  . Smoking status: Never Smoker  . Smokeless tobacco: Never Used  Vaping Use  . Vaping Use: Never used  Substance and Sexual Activity  . Alcohol use: No  . Drug use: No  . Sexual activity: Yes    Partners: Male    Birth control/protection: Post-menopausal  Other Topics Concern  . Not on file  Social History Narrative  . Not on file   Social Determinants of Health   Financial Resource Strain:   . Difficulty of Paying Living Expenses: Not on file  Food Insecurity:   . Worried About Charity fundraiser in the Last Year: Not on file  . Ran  Out of Food in the Last Year: Not on file  Transportation Needs:   . Lack of Transportation (Medical): Not on file  . Lack of Transportation (Non-Medical): Not on file  Physical Activity:   . Days of Exercise per Week: Not on file  . Minutes of Exercise per Session: Not on file  Stress:   . Feeling of Stress : Not on file  Social Connections:   . Frequency of Communication with Friends and Family: Not on file  . Frequency of Social Gatherings with Friends and Family: Not on file  . Attends Religious Services: Not on file  . Active Member of Clubs or Organizations: Not on file  . Attends Archivist Meetings: Not on file  . Marital Status: Not on file    Vitals:  08/20/20 1048  BP: 124/70  Pulse: 86  Resp: 16  SpO2: 98%   Body mass index is 29.45 kg/m.  Physical Exam Vitals and nursing note reviewed.  Constitutional:      General: She is not in acute distress.    Appearance: She is well-developed.  HENT:     Head: Normocephalic and atraumatic.  Eyes:     Conjunctiva/sclera: Conjunctivae normal.     Pupils: Pupils are equal, round, and reactive to light.  Cardiovascular:     Rate and Rhythm: Normal rate and regular rhythm.     Heart sounds: No murmur heard.   Pulmonary:     Effort: Pulmonary effort is normal. No respiratory distress.     Breath sounds: Normal breath sounds.  Abdominal:     Palpations: Abdomen is soft. There is no mass.     Tenderness: There is no abdominal tenderness.  Musculoskeletal:     Lumbar back: Tenderness present. No bony tenderness.  Lymphadenopathy:     Cervical: No cervical adenopathy.  Skin:    General: Skin is warm.     Findings: No erythema or rash.  Neurological:     Mental Status: She is alert and oriented to person, place, and time.     Cranial Nerves: No cranial nerve deficit.     Comments: RLE weakness (foot drop), brace in place. Antalgic gait , assistared by a cane.  Psychiatric:     Comments: Well groomed,  good eye contact.   ASSESSMENT AND PLAN:  [name] was seen today for 4-5 months follow-up.  Orders Placed This Encounter  Procedures  . Flu Vaccine QUAD 36+ mos IM    Radicular pain Left lower back and LLE. MRI lumbar in 04/2019 showed some left-sided changes. Recommend mentioning this new problem with her ortho and PT. Continue Gabapentin 400 mg tid.  Left low back pain, unspecified chronicity, unspecified whether sciatica present Still having problems with spinal stimulator. Following with PT and orhto.  Anxiety disorder, unspecified type Stable. Continue Cymbalta 30 mg daily and Alprazolam 0.25 daily as needed.  Essential hypertension BP adequately controlled. Continue Carvedilol 6.25 mg bid. Low salt diet to continue.  Need for influenza vaccination -     Flu Vaccine QUAD 36+ mos IM   Return in about 6 months (around 02/18/2021) for May need medicare visit.[name]   Cheryl Roettger G. [name], MD  [facility]. [place] office.  A few things to remember from today's visit:   Need for influenza vaccination - Plan: Flu Vaccine QUAD 36+ mos IM  Anxiety disorder, unspecified type  Radicular pain  Left low back pain, unspecified chronicity, unspecified whether sciatica present  No changes today. Left side pain could be related to bulged dic. Topical icy hot may help. Continue Methocarbamol and aquatic exercises. Please follow with ortho if pain is not any better.  If you need refills please call your pharmacy. Do not use My Chart to request refills or for acute issues that need immediate attention.    Please be sure medication list is accurate. If a new problem present, please set up appointment sooner than planned today.

## 2020-08-20 NOTE — Patient Instructions (Signed)
A few things to remember from today's visit:   Need for influenza vaccination - Plan: Flu Vaccine QUAD 36+ mos IM  Anxiety disorder, unspecified type  Radicular pain  Left low back pain, unspecified chronicity, unspecified whether sciatica present  No changes today. Left side pain could be related to bulged dic. Topical icy hot may help. Continue Methocarbamol and aquatic exercises. Please follow with ortho if pain is not any better.  If you need refills please call your pharmacy. Do not use My Chart to request refills or for acute issues that need immediate attention.    Please be sure medication list is accurate. If a new problem present, please set up appointment sooner than planned today.

## 2020-08-21 ENCOUNTER — Encounter: Payer: Self-pay | Admitting: Physical Therapy

## 2020-08-21 NOTE — Therapy (Signed)
[facility] [address] [place], [name], [address] Phone: [phone]   Fax:  [phone]  Physical Therapy Treatment  Patient Details  Name: [name] MRN: [id number] Date of Birth: 10-17-1961 Referring Provider (PT): Danella Sensing, NP   Encounter Date: 08/20/2020   PT End of Session - 08/21/20 2134    Visit Number 14   visit 3 for Medicare   Number of Visits 19    Date for PT Re-Evaluation 09/29/20    Authorization Type BCBS; pt has Medicare as of 07-27-20    Authorization Time Period 07-30-20 - 10-26-20    Progress Note Due on Visit 21   would be visit #10 for Medicare   PT Start Time 1550    PT Stop Time 1635    PT Time Calculation (min) 45 min    Equipment Utilized During Treatment Other (comment)   pool noodle   Activity Tolerance Patient tolerated treatment well    Behavior During Therapy [facility] for tasks assessed/performed           Past Medical History:  Diagnosis Date  . Anxiety   . Back pain    related to spinal stenosis and disc problem, radiates down left buttocks to leg., weakness occ.  [name] Dyspnea   . Headache   . Hyperlipidemia   . Hypertension   . Lumbar post-laminectomy syndrome   . PONV (postoperative nausea and vomiting)   . Vaginal foreign object    "Uses Femring"    Past Surgical History:  Procedure Laterality Date  . ABDOMINAL HYSTERECTOMY    . CARDIAC CATHETERIZATION N/A 04/18/2015   Procedure: Left Heart Cath and Coronary Angiography;  Surgeon: [name], MD;  Location: [place] [facility];  Service: Cardiovascular;  Laterality: N/A;  . COLONOSCOPY W/ BIOPSIES AND POLYPECTOMY    . FOOT SURGERY Bilateral    [place] "bunion,bone spur, tendon" (1) -6'16, (1)-10'16  . IR EPIDUROGRAPHY  07/21/2018  . LUMBAR LAMINECTOMY/DECOMPRESSION MICRODISCECTOMY Bilateral 12/28/2015   Procedure: MICRO LUMBAR DECOMPRESSION L4 - L5 BILATERALLY;  Surgeon: [name], MD;   Location: [facility];  Service: Orthopedics;  Laterality: Bilateral;  . LUMBAR LAMINECTOMY/DECOMPRESSION MICRODISCECTOMY Bilateral 03/04/2018   Procedure: Revision of Microlumbar Decompression Bilateral Lumbar Four-Five;  Surgeon: [name], MD;  Location: [place];  Service: Orthopedics;  Laterality: Bilateral;  90 mins  . SPINAL CORD STIMULATOR INSERTION N/A 09/28/2019   Procedure: THORACIC SPINAL CORD STIMULATOR INSERTION;  Surgeon: Melina Schools, MD;  Location: [facility];  Service: Orthopedics;  Laterality: N/A;  2.5 hrs  . TUBAL LIGATION    . WISDOM TOOTH EXTRACTION    . WOUND EXPLORATION N/A 03/04/2018   Procedure: EXPLORATION OF LUMBAR DECOMPRESSION WOUND;  Surgeon: [name], MD;  Location: [place];  Service: Orthopedics;  Laterality: N/A;    There were no vitals filed for this visit.   Subjective Assessment - 08/21/20 2131    Subjective Pt states she saw Dr. Naaman Plummer and Dr. [name] this morning - she says Dr. Naaman Plummer wants her to do PT in clinic in addition to aquatic therapy    Patient Stated Goals improve leg strength and balance through aquatic therapy    Currently in Pain? Yes    Pain Score 4     Pain Location Back    Pain Orientation Right    Pain Descriptors / Indicators Aching;Discomfort;Dull    Pain Type Chronic pain    Pain Onset More than a month ago  Pain Frequency Intermittent               Aquatic therapy at [facility] - pool temp 87.6 degrees   Patient seen for aquatic therapy today.  Treatment took place in water 3.5-4 feet deep depending upon activity.  Pt entered and  Exited the pool via ramp negotiation with use of bilateral hand rails with supervision.  Pt performed runner's stretch on each leg and heel cord stretch bil. LE's with feet on pool wall - 30 sec hold each stretch x 1 rep Pt performed amb. 43m  across pool approx. 8 reps with bil. UE support on pool noodle, stabilized by PT for support & stability for balance  Pt performed standing hip flexion,  abduction and hip extension with knee extended -  10 reps each leg with UE support for strengthening (no ankle cuffs used)   Marching in place 10 reps each leg with bil. UE support on pool edge  Squats x 10 reps with bil. UE support on pool edge  Pt requires aquatic therapy for the unweighting provided by the buoyancy of the water and needs the viscosity for strengthening LE's and trunk musc.; buoyancy of water needed for spinal decompression for increased tolerance for movement with less pain experienced than with land exercises                             PT Short Term Goals - 08/21/20 2138      PT SHORT TERM GOAL #1   Title Pt will report decreased pain in low and Rt hip area after aquatic therapy to </= 3/10 for increased ease with mobility.    Baseline 4/10 on 07-30-20    Time 4    Period Weeks    Status On-going    Target Date 08/27/20   target date extended due to wait list for aquatic therapy     PT SHORT TERM GOAL #2   Title Pt will negotiate steps into and out of pool with SBA  with use of hand rails using a step over step sequence.    Time 4    Period Weeks    Status New    Target Date 08/27/20             PT Long Term Goals - 08/21/20 2139      PT LONG TERM GOAL #1   Title Pt will be independent in HEP for aquatic exercises to be continued upon discharge from PT.    Time 8    Period Weeks    Status On-going      PT LONG TERM GOAL #2   Title Pt will report 50% decrease in pain (</= 2/10 intensity) in low back/Rt hip region for increased comfort and ease with mobility and ADL's.    Baseline 4/10 intensity on 07-30-20    Time 8    Period Weeks    Status On-going      PT LONG TERM GOAL #3   Title Perform sit to stand from bench in pool 3 times without UE support to demo increased RLE strength.    Time 8    Period Weeks    Status New                 Plan - 08/21/20 2135    Clinical Impression Statement Pt had 2 occurrences of  RLE tremoring, with pt requiring mod to max assist to recover LOB  as pt becomes fearful & anxious when this occurs.  Pt performed Rt hip ROM exercises, with pt requesting not to use ankle cuffs due to discomfort in Rt hip and low back with use of increased resistance.  Cont with POC.    Personal Factors and Comorbidities Past/Current Experience;Comorbidity 2;Time since onset of injury/illness/exacerbation    Examination-Activity Limitations Stand;Locomotion Level;Bend;Squat;Stairs;Transfers;Carry    Examination-Participation Restrictions Cleaning;Community Activity;Meal Prep;Shop    Stability/Clinical Decision Making Stable/Uncomplicated    Rehab Potential Good    PT Frequency 1x / week    PT Duration 8 weeks    PT Treatment/Interventions Manual techniques;Patient/family education;Therapeutic exercise;Therapeutic activities;Gait training;ADLs/Self Care Home Management;Neuromuscular re-education;Functional mobility training;Passive range of motion;Balance training;Aquatic Therapy;Biofeedback;DME Instruction;Stair training;Orthotic Fit/Training;Dry needling;Moist Heat;Scar mobilization    PT Next Visit Plan cont aquatic therapy    Consulted and Agree with Plan of Care Patient           Patient will benefit from skilled therapeutic intervention in order to improve the following deficits and impairments:  Pain, Postural dysfunction, Difficulty walking, Decreased strength, Decreased activity tolerance, Abnormal gait, Increased muscle spasms, Decreased endurance, Decreased balance, Decreased coordination, Decreased knowledge of use of DME, Decreased mobility, Decreased safety awareness, Impaired sensation, Impaired tone  Visit Diagnosis: Other abnormalities of gait and mobility  Unsteadiness on feet  Muscle weakness (generalized)     Problem List Patient Active Problem List   Diagnosis Date Noted  . Chronic pain 09/28/2019  . Lumbar spinal stenosis   . Radicular pain   . Gait disorder     . Difficulty in urination 07/13/2018  . Back pain 07/12/2018  . Hyperlipidemia 04/16/2018  . Anxiety disorder, unspecified 04/16/2018  . AKI (acute kidney injury) ([place])   . Benign essential HTN   . Major depression, recurrent ([place])   . Lumbar radiculopathy 03/09/2018  . Paraparesis ([place]) 03/09/2018  . Essential hypertension   . Chronic nonintractable headache   . Leukocytosis   . Acute blood loss anemia   . Postoperative pain   . Generalized weakness   . Spinal stenosis at L4-L5 level 03/04/2018  . Spinal stenosis of lumbar region 12/28/2015  . Chest pain 04/16/2015    Kierre Deines, [place], [place], [place] 08/21/2020, 9:40 PM  [facility] [address] [place], [name], [address] Phone: [phone]   Fax:  [phone]  Name: [name] MRN: [id number] Date of Birth: 1961/08/01

## 2020-08-23 MED FILL — FEMRING 0.05 MG/24HR RING: 0.05 | 90 days supply | Qty: 1 | Fill #3

## 2020-08-27 ENCOUNTER — Other Ambulatory Visit: Payer: Self-pay

## 2020-08-27 ENCOUNTER — Ambulatory Visit: Payer: PPO | Attending: Registered Nurse | Admitting: Physical Therapy

## 2020-08-27 DIAGNOSIS — R2681 Unsteadiness on feet: Secondary | ICD-10-CM | POA: Insufficient documentation

## 2020-08-27 DIAGNOSIS — R29818 Other symptoms and signs involving the nervous system: Secondary | ICD-10-CM | POA: Diagnosis not present

## 2020-08-27 DIAGNOSIS — R2689 Other abnormalities of gait and mobility: Secondary | ICD-10-CM

## 2020-08-27 DIAGNOSIS — M6281 Muscle weakness (generalized): Secondary | ICD-10-CM | POA: Diagnosis not present

## 2020-08-28 ENCOUNTER — Encounter: Payer: Self-pay | Admitting: Physical Therapy

## 2020-08-28 NOTE — Therapy (Signed)
[facility] [address] [place], [name], [address] Phone: [phone]   Fax:  [phone]  Physical Therapy Treatment  Patient Details  Name: [name] MRN: [id number] Date of Birth: 08-Feb-1961 Referring Provider (PT): Danella Sensing, NP   Encounter Date: 08/27/2020   PT End of Session - 08/28/20 2127    Visit Number 15   visit 4 for Medicare   Number of Visits 19    Date for PT Re-Evaluation 09/29/20    Authorization Type BCBS; pt has Medicare as of 07-27-20    Authorization Time Period 07-30-20 - 10-26-20    Progress Note Due on Visit 21   would be visit #10 for Medicare   PT Start Time 1550    PT Stop Time 1635    PT Time Calculation (min) 45 min    Equipment Utilized During Treatment Other (comment)   pool noodle   Activity Tolerance Patient tolerated treatment well    Behavior During Therapy [facility] for tasks assessed/performed           Past Medical History:  Diagnosis Date  . Anxiety   . Back pain    related to spinal stenosis and disc problem, radiates down left buttocks to leg., weakness occ.  [name] Dyspnea   . Headache   . Hyperlipidemia   . Hypertension   . Lumbar post-laminectomy syndrome   . PONV (postoperative nausea and vomiting)   . Vaginal foreign object    "Uses Femring"    Past Surgical History:  Procedure Laterality Date  . ABDOMINAL HYSTERECTOMY    . CARDIAC CATHETERIZATION N/A 04/18/2015   Procedure: Left Heart Cath and Coronary Angiography;  Surgeon: [name], MD;  Location: [place] [facility];  Service: Cardiovascular;  Laterality: N/A;  . COLONOSCOPY W/ BIOPSIES AND POLYPECTOMY    . FOOT SURGERY Bilateral    [place] "bunion,bone spur, tendon" (1) -6'16, (1)-10'16  . IR EPIDUROGRAPHY  07/21/2018  . LUMBAR LAMINECTOMY/DECOMPRESSION MICRODISCECTOMY Bilateral 12/28/2015   Procedure: MICRO LUMBAR DECOMPRESSION L4 - L5 BILATERALLY;  Surgeon: [name], MD;   [facility];  Service: Orthopedics;  Laterality: Bilateral;  . LUMBAR LAMINECTOMY/DECOMPRESSION MICRODISCECTOMY Bilateral 03/04/2018   Procedure: Revision of Microlumbar Decompression Bilateral Lumbar Four-Five;  Surgeon: [name], MD;  Location: [facility];  Service: Orthopedics;  Laterality: Bilateral;  90 mins  . SPINAL CORD STIMULATOR INSERTION N/A 09/28/2019   Procedure: THORACIC SPINAL CORD STIMULATOR INSERTION;  Surgeon: Melina Schools, MD;  Location: [facility];  Service: Orthopedics;  Laterality: N/A;  2.5 hrs  . TUBAL LIGATION    . WISDOM TOOTH EXTRACTION    . WOUND EXPLORATION N/A 03/04/2018   Procedure: EXPLORATION OF LUMBAR DECOMPRESSION WOUND;  Surgeon: [name], MD;  Location: [facility];  Service: Orthopedics;  Laterality: N/A;    There were no vitals filed for this visit.   Subjective Assessment - 08/28/20 2125    Subjective Pt states she is feeling good today - not having much back pain today    Patient Stated Goals improve leg strength and balance through aquatic therapy    Currently in Pain? Yes    Pain Score 1     Pain Location Back    Pain Orientation Right    Pain Descriptors / Indicators Aching;Discomfort;Dull    Pain Type Chronic pain    Pain Onset More than a month ago    Pain Frequency Intermittent  Aquatic therapy at [facility] - pool temp 87.2 degrees   Patient seen for aquatic therapy today.  Treatment took place in water 3.5-4 feet deep depending upon activity.  Pt entered and  Exited the pool via ramp negotiation with use of bilateral hand rails with supervision.  Pt performed runner's stretch on each leg and heel cord stretch bil. LE's with feet on pool wall - 30 sec hold each stretch x 1 rep Pt performed amb. 42m  across pool approx. 8 reps with bil. UE support on pool noodle  Pt performed standing hip flexion, abduction and hip extension with knee extended -  10 reps each leg with UE support for increased resistance for  strengthening (no ankle cuffs used due to c/o discomfort in Rt hip with Rt hip extension with use of cuff)  Squats x 10 reps with bil. UE support on pool edge  Pt requires aquatic therapy for the unweighting provided by the buoyancy of the water and needs the viscosity for strengthening LE's and trunk musc.; buoyancy of water needed for spinal decompression for increased tolerance for movement with less pain experienced than with land exercises                                        PT Short Term Goals - 08/21/20 2138      PT SHORT TERM GOAL #1   Title Pt will report decreased pain in low and Rt hip area after aquatic therapy to </= 3/10 for increased ease with mobility.    Baseline 4/10 on 07-30-20    Time 4    Period Weeks    Status On-going    Target Date 08/27/20   target date extended due to wait list for aquatic therapy     PT SHORT TERM GOAL #2   Title Pt will negotiate steps into and out of pool with SBA  with use of hand rails using a step over step sequence.    Time 4    Period Weeks    Status New    Target Date 08/27/20             PT Long Term Goals - 08/21/20 2139      PT LONG TERM GOAL #1   Title Pt will be independent in HEP for aquatic exercises to be continued upon discharge from PT.    Time 8    Period Weeks    Status On-going      PT LONG TERM GOAL #2   Title Pt will report 50% decrease in pain (</= 2/10 intensity) in low back/Rt hip region for increased comfort and ease with mobility and ADL's.    Baseline 4/10 intensity on 07-30-20    Time 8    Period Weeks    Status On-going      PT LONG TERM GOAL #3   Title Perform sit to stand from bench in pool 3 times without UE support to demo increased RLE strength.    Time 8    Period Weeks    Status New                 Plan - 08/28/20 2128    Clinical Impression Statement Pt had no occurrences of RLE tremoring/buckling in aquatic therapy session today; pt  reported minimal back pain - possible correlation between less back pain - increased RLE strength noted.  Pt cont to require UE support on pool noodle for assist with balance during gait training in water.  Cont with POC.    Personal Factors and Comorbidities Past/Current Experience;Comorbidity 2;Time since onset of injury/illness/exacerbation    Examination-Activity Limitations Stand;Locomotion Level;Bend;Squat;Stairs;Transfers;Carry    Examination-Participation Restrictions Cleaning;Community Activity;Meal Prep;Shop    Stability/Clinical Decision Making Stable/Uncomplicated    Rehab Potential Good    PT Frequency 1x / week    PT Duration 8 weeks    PT Treatment/Interventions Manual techniques;Patient/family education;Therapeutic exercise;Therapeutic activities;Gait training;ADLs/Self Care Home Management;Neuromuscular re-education;Functional mobility training;Passive range of motion;Balance training;Aquatic Therapy;Biofeedback;DME Instruction;Stair training;Orthotic Fit/Training;Dry needling;Moist Heat;Scar mobilization    PT Next Visit Plan cont aquatic therapy    Consulted and Agree with Plan of Care Patient           Patient will benefit from skilled therapeutic intervention in order to improve the following deficits and impairments:  Pain, Postural dysfunction, Difficulty walking, Decreased strength, Decreased activity tolerance, Abnormal gait, Increased muscle spasms, Decreased endurance, Decreased balance, Decreased coordination, Decreased knowledge of use of DME, Decreased mobility, Decreased safety awareness, Impaired sensation, Impaired tone  Visit Diagnosis: Other abnormalities of gait and mobility  Unsteadiness on feet  Muscle weakness (generalized)  Other symptoms and signs involving the nervous system     Problem List Patient Active Problem List   Diagnosis Date Noted  . Chronic pain 09/28/2019  . Lumbar spinal stenosis   . Radicular pain   . Gait disorder   .  Difficulty in urination 07/13/2018  . Back pain 07/12/2018  . Hyperlipidemia 04/16/2018  . Anxiety disorder, unspecified 04/16/2018  . AKI (acute kidney injury) ([place])   . Benign essential HTN   . Major depression, recurrent ([place])   . Lumbar radiculopathy 03/09/2018  . Paraparesis ([place]) 03/09/2018  . Essential hypertension   . Chronic nonintractable headache   . Leukocytosis   . Acute blood loss anemia   . Postoperative pain   . Generalized weakness   . Spinal stenosis at L4-L5 level 03/04/2018  . Spinal stenosis of lumbar region 12/28/2015  . Chest pain 04/16/2015    Anicka Stuckert, [place], PT, [facility] 08/28/2020, 9:36 PM  [facility] [address] [place], [name], [address] Phone: [phone]   Fax:  [phone]  Name: [name] MRN: [id number] Date of Birth: 1961/08/20

## 2020-09-03 ENCOUNTER — Other Ambulatory Visit: Payer: Self-pay | Admitting: Family Medicine

## 2020-09-03 ENCOUNTER — Other Ambulatory Visit: Payer: Self-pay

## 2020-09-03 ENCOUNTER — Ambulatory Visit: Payer: PPO | Admitting: Physical Therapy

## 2020-09-03 DIAGNOSIS — R2681 Unsteadiness on feet: Secondary | ICD-10-CM

## 2020-09-03 DIAGNOSIS — R2689 Other abnormalities of gait and mobility: Secondary | ICD-10-CM

## 2020-09-03 MED FILL — CARVEDILOL 6.25 MG TABLET: 6.25 | 90 days supply | Qty: 180 | Fill #0

## 2020-09-04 ENCOUNTER — Encounter: Payer: Self-pay | Admitting: Physical Therapy

## 2020-09-04 NOTE — Therapy (Signed)
[facility] [address] [place], [name], [address] Phone: [phone]   Fax:  [phone]  Physical Therapy Treatment  Patient Details  Name: [name] MRN: [id number] Date of Birth: 06/06/1961 Referring Provider (PT): Danella Sensing, NP   Encounter Date: 09/03/2020   PT End of Session - 09/04/20 2024    Visit Number 16   visit 5 for Medicare   Number of Visits 19    Date for PT Re-Evaluation 09/29/20    Authorization Type BCBS; pt has Medicare as of 07-27-20    Authorization Time Period 07-30-20 - 10-26-20    Progress Note Due on Visit 21   would be visit #10 for Medicare   PT Start Time 1545    PT Stop Time 1630    PT Time Calculation (min) 45 min    Equipment Utilized During Treatment Other (comment)   pool noodle   Activity Tolerance Patient tolerated treatment well    Behavior During Therapy [facility] for tasks assessed/performed           Past Medical History:  Diagnosis Date  . Anxiety   . Back pain    related to spinal stenosis and disc problem, radiates down left buttocks to leg., weakness occ.  [name] Dyspnea   . Headache   . Hyperlipidemia   . Hypertension   . Lumbar post-laminectomy syndrome   . PONV (postoperative nausea and vomiting)   . Vaginal foreign object    "Uses Femring"    Past Surgical History:  Procedure Laterality Date  . ABDOMINAL HYSTERECTOMY    . CARDIAC CATHETERIZATION N/A 04/18/2015   Procedure: Left Heart Cath and Coronary Angiography;  Surgeon: [name], MD;  Location: [facility];  Service: Cardiovascular;  Laterality: N/A;  . COLONOSCOPY W/ BIOPSIES AND POLYPECTOMY    . FOOT SURGERY Bilateral    [place] "bunion,bone spur, tendon" (1) -6'16, (1)-10'16  . IR EPIDUROGRAPHY  07/21/2018  . LUMBAR LAMINECTOMY/DECOMPRESSION MICRODISCECTOMY Bilateral 12/28/2015   Procedure: MICRO LUMBAR DECOMPRESSION L4 - L5 BILATERALLY;  Surgeon: [name], MD;   Location: [facility];  Service: Orthopedics;  Laterality: Bilateral;  . LUMBAR LAMINECTOMY/DECOMPRESSION MICRODISCECTOMY Bilateral 03/04/2018   Procedure: Revision of Microlumbar Decompression Bilateral Lumbar Four-Five;  Surgeon: [name], MD;  Location: [facility];  Service: Orthopedics;  Laterality: Bilateral;  90 mins  . SPINAL CORD STIMULATOR INSERTION N/A 09/28/2019   Procedure: THORACIC SPINAL CORD STIMULATOR INSERTION;  Surgeon: Melina Schools, MD;  Location: [facility];  Service: Orthopedics;  Laterality: N/A;  2.5 hrs  . TUBAL LIGATION    . WISDOM TOOTH EXTRACTION    . WOUND EXPLORATION N/A 03/04/2018   Procedure: EXPLORATION OF LUMBAR DECOMPRESSION WOUND;  Surgeon: [name], MD;  Location: [place] [facility];  Service: Orthopedics;  Laterality: N/A;    There were no vitals filed for this visit.   Subjective Assessment - 09/04/20 2019    Subjective Pt states she is having a rough day today - states her low back pain is radiating down her right leg - states the increased pain started last week; pt states she has not eaten today because she hasn't felt well    Patient Stated Goals improve leg strength and balance through aquatic therapy    Currently in Pain? Yes    Pain Score 8     Pain Location Back    Pain Orientation Right    Pain Descriptors / Indicators Aching;Discomfort;Dull    Pain Type Chronic pain  Pain Onset More than a month ago    Pain Frequency Intermittent               Aquatic therapy at [facility] - pool temp 86.2 degrees   Patient seen for aquatic therapy today.  Treatment took place in water 3.5-4 feet deep depending upon activity.  Pt entered and  Exited the pool via ramp negotiation with use of bilateral hand rails with supervision.  Pt performed runner's stretch on each leg and heel cord stretch bil. LE's with feet on pool wall - 30 sec hold each stretch x 1 rep Pt performed amb. 62m  across pool approx. 6 reps with bil. UE support on pool noodle  Pt performed  sidestepping slowly (due to back pain with radiation down RLE) 53m x 1 rep across pool  Pt requires aquatic therapy for the unweighting provided by the buoyancy of the water and needs the viscosity for strengthening LE's and trunk musc.; buoyancy of water needed for spinal decompression for increased tolerance for movement with less pain experienced than with land exercises                             PT Short Term Goals - 09/04/20 2042      PT SHORT TERM GOAL #1   Title Pt will report decreased pain in low and Rt hip area after aquatic therapy to </= 3/10 for increased ease with mobility.    Baseline 4/10 on 07-30-20    Time 4    Period Weeks    Status On-going    Target Date 08/27/20   target date extended due to wait list for aquatic therapy     PT SHORT TERM GOAL #2   Title Pt will negotiate steps into and out of pool with SBA  with use of hand rails using a step over step sequence.    Time 4    Period Weeks    Status New    Target Date 08/27/20             PT Long Term Goals - 09/04/20 2042      PT LONG TERM GOAL #1   Title Pt will be independent in HEP for aquatic exercises to be continued upon discharge from PT.    Time 8    Period Weeks    Status On-going      PT LONG TERM GOAL #2   Title Pt will report 50% decrease in pain (</= 2/10 intensity) in low back/Rt hip region for increased comfort and ease with mobility and ADL's.    Baseline 4/10 intensity on 07-30-20    Time 8    Period Weeks    Status On-going      PT LONG TERM GOAL #3   Title Perform sit to stand from bench in pool 3 times without UE support to demo increased RLE strength.    Time 8    Period Weeks    Status New                 Plan - 09/04/20 2034    Clinical Impression Statement Pt had increased pain in RLE and low back which impacted pt's ability to perform ambulation in water smoothly; RLE noted to tremor, which pt attributed to fatigue and weakness due to  radiating pain.  Pt declined to attempt standing hip exercises due to increased low back pain.  Cont with POC as  tolerated.    Personal Factors and Comorbidities Past/Current Experience;Comorbidity 2;Time since onset of injury/illness/exacerbation    Examination-Activity Limitations Stand;Locomotion Level;Bend;Squat;Stairs;Transfers;Carry    Examination-Participation Restrictions Cleaning;Community Activity;Meal Prep;Shop    Stability/Clinical Decision Making Stable/Uncomplicated    Rehab Potential Good    PT Frequency 1x / week    PT Duration 8 weeks    PT Treatment/Interventions Manual techniques;Patient/family education;Therapeutic exercise;Therapeutic activities;Gait training;ADLs/Self Care Home Management;Neuromuscular re-education;Functional mobility training;Passive range of motion;Balance training;Aquatic Therapy;Biofeedback;DME Instruction;Stair training;Orthotic Fit/Training;Dry needling;Moist Heat;Scar mobilization    PT Next Visit Plan cont aquatic therapy    Consulted and Agree with Plan of Care Patient           Patient will benefit from skilled therapeutic intervention in order to improve the following deficits and impairments:  Pain, Postural dysfunction, Difficulty walking, Decreased strength, Decreased activity tolerance, Abnormal gait, Increased muscle spasms, Decreased endurance, Decreased balance, Decreased coordination, Decreased knowledge of use of DME, Decreased mobility, Decreased safety awareness, Impaired sensation, Impaired tone  Visit Diagnosis: Other abnormalities of gait and mobility  Unsteadiness on feet     Problem List Patient Active Problem List   Diagnosis Date Noted  . Chronic pain 09/28/2019  . Lumbar spinal stenosis   . Radicular pain   . Gait disorder   . Difficulty in urination 07/13/2018  . Back pain 07/12/2018  . Hyperlipidemia 04/16/2018  . Anxiety disorder, unspecified 04/16/2018  . AKI (acute kidney injury) ([place])   . Benign essential  HTN   . Major depression, recurrent ([place])   . Lumbar radiculopathy 03/09/2018  . Paraparesis ([place]) 03/09/2018  . Essential hypertension   . Chronic nonintractable headache   . Leukocytosis   . Acute blood loss anemia   . Postoperative pain   . Generalized weakness   . Spinal stenosis at L4-L5 level 03/04/2018  . Spinal stenosis of lumbar region 12/28/2015  . Chest pain 04/16/2015    Cionna Collantes, [place], PT, ATRIC 09/04/2020, 8:45 PM  [facility] [address] [place], [name], [address] Phone: [phone]   Fax:  [phone]  Name: [name] MRN: [id number] Date of Birth: 1961-08-24

## 2020-09-12 ENCOUNTER — Ambulatory Visit: Payer: PPO

## 2020-09-14 MED FILL — GABAPENTIN 400 MG CAPSULE: 400 | 30 days supply | Qty: 90 | Fill #1

## 2020-09-17 ENCOUNTER — Other Ambulatory Visit: Payer: Self-pay

## 2020-09-17 ENCOUNTER — Ambulatory Visit: Payer: PPO | Admitting: Physical Therapy

## 2020-09-17 DIAGNOSIS — R2689 Other abnormalities of gait and mobility: Secondary | ICD-10-CM

## 2020-09-17 DIAGNOSIS — R2681 Unsteadiness on feet: Secondary | ICD-10-CM

## 2020-09-17 DIAGNOSIS — M6281 Muscle weakness (generalized): Secondary | ICD-10-CM

## 2020-09-18 ENCOUNTER — Encounter: Payer: Self-pay | Admitting: Physical Therapy

## 2020-09-18 NOTE — Therapy (Signed)
[facility] [address] [place], [name], [address] Phone: [phone]   Fax:  [phone]  Physical Therapy Treatment  Patient Details  Name: [name] MRN: [id number] Date of Birth: [birth year] Referring Provider (PT): Danella Sensing, NP   Encounter Date: 09/17/2020   PT End of Session - 09/18/20 2116    Visit Number 17   visit 6 for Medicare   Number of Visits 19    Date for PT Re-Evaluation 09/29/20    Authorization Type BCBS; pt has Medicare as of 07-27-20    Authorization Time Period 07-30-20 - 10-26-20    Progress Note Due on Visit 21   would be visit #10 for Medicare   PT Start Time 1545    PT Stop Time 1642    PT Time Calculation (min) 57 min    Equipment Utilized During Treatment Other (comment)   pool noodle   Activity Tolerance Patient tolerated treatment well    Behavior During Therapy [facility] for tasks assessed/performed           Past Medical History:  Diagnosis Date   Anxiety    Back pain    related to spinal stenosis and disc problem, radiates down left buttocks to leg., weakness occ.   Dyspnea    Headache    Hyperlipidemia    Hypertension    Lumbar post-laminectomy syndrome    PONV (postoperative nausea and vomiting)    Vaginal foreign object    "Uses Femring"    Past Surgical History:  Procedure Laterality Date   ABDOMINAL HYSTERECTOMY     CARDIAC CATHETERIZATION N/A 04/18/2015   Procedure: Left Heart Cath and Coronary Angiography;  Surgeon: [name], MD;  Location: [facility];  Service: Cardiovascular;  Laterality: N/A;   COLONOSCOPY W/ BIOPSIES AND POLYPECTOMY     FOOT SURGERY Bilateral    [facility] "bunion,bone spur, tendon" (1) -6'16, (1)-10'16   IR EPIDUROGRAPHY  07/21/2018   LUMBAR LAMINECTOMY/DECOMPRESSION MICRODISCECTOMY Bilateral 12/28/2015   Procedure: MICRO LUMBAR DECOMPRESSION L4 - L5 BILATERALLY;  Surgeon: [name], MD;   [facility];  Service: Orthopedics;  Laterality: Bilateral;   LUMBAR LAMINECTOMY/DECOMPRESSION MICRODISCECTOMY Bilateral 03/04/2018   Procedure: Revision of Microlumbar Decompression Bilateral Lumbar Four-Five;  Surgeon: [name], MD;  Location: [facility];  Service: Orthopedics;  Laterality: Bilateral;  90 mins   SPINAL CORD STIMULATOR INSERTION N/A 09/28/2019   Procedure: THORACIC SPINAL CORD STIMULATOR INSERTION;  Surgeon: Melina Schools, MD;  Location: [facility];  Service: Orthopedics;  Laterality: N/A;  2.5 hrs   TUBAL LIGATION     WISDOM TOOTH EXTRACTION     WOUND EXPLORATION N/A 03/04/2018   Procedure: EXPLORATION OF LUMBAR DECOMPRESSION WOUND;  Surgeon: [name], MD;  Location: [facility];  Service: Orthopedics;  Laterality: N/A;    There were no vitals filed for this visit.   Subjective Assessment - 09/18/20 2114    Subjective Pt states the severe back pain she was having 2 weeks ago has really improved - states she used heating pad for several days and rested in bed and says this helped.    Patient Stated Goals improve leg strength and balance through aquatic therapy    Currently in Pain? Yes    Pain Score 5     Pain Location Back    Pain Orientation Right    Pain Descriptors / Indicators Aching;Discomfort;Dull    Pain Type Chronic pain    Pain Onset More than a month  ago    Pain Frequency Intermittent               Aquatic therapy at [facility] - pool temp 87.4 degrees   Patient seen for aquatic therapy today.  Treatment took place in water 3.5-4 feet deep depending upon activity.  Pt entered and  Exited the pool via ramp negotiation with use of bilateral hand rails with supervision.  Pt performed runner's stretch on each leg and heel cord stretch bil. LE's with feet on pool wall - 30 sec hold each stretch x 1 rep Pt performed amb. 17m  across pool approx. 8 reps with bil. UE support on pool noodle  Pt performed sidestepping with bil. UE support on pool noodle 61m x 2  reps - slow speed due to RLE weakness  Pt performed standing hip flexion, abduction and hip extension with knee extended -  10 reps each leg with UE support for increased resistance for strengthening (no ankle cuffs used due to c/o discomfort in Rt hip with Rt hip extension with use of cuff)  Marching in place with bil. UE support on pool edge - 10 reps each   Squats x 10 reps with bil. UE support on pool edge  Pt requires aquatic therapy for the unweighting provided by the buoyancy of the water and needs the viscosity for strengthening LE's and trunk musc.; buoyancy of water needed for spinal decompression for increased tolerance for movement with less pain experienced than with land exercises                                      PT Short Term Goals - 09/18/20 2120      PT SHORT TERM GOAL #1   Title Pt will report decreased pain in low and Rt hip area after aquatic therapy to </= 3/10 for increased ease with mobility.    Baseline 4/10 on 07-30-20    Time 4    Period Weeks    Status On-going    Target Date 08/27/20   target date extended due to wait list for aquatic therapy     PT SHORT TERM GOAL #2   Title Pt will negotiate steps into and out of pool with SBA  with use of hand rails using a step over step sequence.    Time 4    Period Weeks    Status New    Target Date 08/27/20             PT Long Term Goals - 09/18/20 2120      PT LONG TERM GOAL #1   Title Pt will be independent in HEP for aquatic exercises to be continued upon discharge from PT.    Time 8    Period Weeks    Status On-going      PT LONG TERM GOAL #2   Title Pt will report 50% decrease in pain (</= 2/10 intensity) in low back/Rt hip region for increased comfort and ease with mobility and ADL's.    Baseline 4/10 intensity on 07-30-20    Time 8    Period Weeks    Status On-going      PT LONG TERM GOAL #3   Title Perform sit to stand from bench in pool 3 times without UE  support to demo increased RLE strength.    Time 8    Period Weeks    Status  New                 Plan - 09/18/20 2117    Clinical Impression Statement Pt tolerating increased water walking and RLE exercises better in today's session than in previous session 2 weeks ago.  Pt continues to decline use of buoyant ankle cuffs for increased resistance/strengthening for Rt hip musc. due to c/o discomfort with large AROM exercises of Rt hip.  Cont with POC.    Personal Factors and Comorbidities Past/Current Experience;Comorbidity 2;Time since onset of injury/illness/exacerbation    Examination-Activity Limitations Stand;Locomotion Level;Bend;Squat;Stairs;Transfers;Carry    Examination-Participation Restrictions Cleaning;Community Activity;Meal Prep;Shop    Stability/Clinical Decision Making Stable/Uncomplicated    Rehab Potential Good    PT Frequency 1x / week    PT Duration 8 weeks    PT Treatment/Interventions Manual techniques;Patient/family education;Therapeutic exercise;Therapeutic activities;Gait training;ADLs/Self Care Home Management;Neuromuscular re-education;Functional mobility training;Passive range of motion;Balance training;Aquatic Therapy;Biofeedback;DME Instruction;Stair training;Orthotic Fit/Training;Dry needling;Moist Heat;Scar mobilization    PT Next Visit Plan cont aquatic therapy    Consulted and Agree with Plan of Care Patient           Patient will benefit from skilled therapeutic intervention in order to improve the following deficits and impairments:  Pain, Postural dysfunction, Difficulty walking, Decreased strength, Decreased activity tolerance, Abnormal gait, Increased muscle spasms, Decreased endurance, Decreased balance, Decreased coordination, Decreased knowledge of use of DME, Decreased mobility, Decreased safety awareness, Impaired sensation, Impaired tone  Visit Diagnosis: Other abnormalities of gait and mobility  Unsteadiness on feet  Muscle weakness  (generalized)     Problem List Patient Active Problem List   Diagnosis Date Noted   Chronic pain 09/28/2019   Lumbar spinal stenosis    Radicular pain    Gait disorder    Difficulty in urination 07/13/2018   Back pain 07/12/2018   Hyperlipidemia 04/16/2018   Anxiety disorder, unspecified 04/16/2018   AKI (acute kidney injury) ([place])    Benign essential HTN    Major depression, recurrent ([place])    Lumbar radiculopathy 03/09/2018   Paraparesis ([place]) 03/09/2018   Essential hypertension    Chronic nonintractable headache    Leukocytosis    Acute blood loss anemia    Postoperative pain    Generalized weakness    Spinal stenosis at L4-L5 level 03/04/2018   Spinal stenosis of lumbar region 12/28/2015   Chest pain 04/16/2015    Odeth Bry, [place], PT, ATRIC 09/18/2020, 9:22 PM  [facility] [address] [place], [name], [address] Phone: [phone]   Fax:  [phone]  Name: [name] MRN: [id number] Date of Birth: 1960-11-01

## 2020-09-24 ENCOUNTER — Ambulatory Visit: Payer: PPO | Admitting: Physical Therapy

## 2020-09-24 ENCOUNTER — Other Ambulatory Visit: Payer: Self-pay

## 2020-09-24 DIAGNOSIS — R2689 Other abnormalities of gait and mobility: Secondary | ICD-10-CM

## 2020-09-24 DIAGNOSIS — M6281 Muscle weakness (generalized): Secondary | ICD-10-CM

## 2020-09-24 DIAGNOSIS — R2681 Unsteadiness on feet: Secondary | ICD-10-CM

## 2020-09-25 ENCOUNTER — Encounter: Payer: Self-pay | Admitting: Physical Therapy

## 2020-09-25 NOTE — Therapy (Signed)
[facility] [address] [place], [name], [address] Phone: [phone]   Fax:  [phone]  Physical Therapy Treatment  Patient Details  Name: [name] MRN: [id number] Date of Birth: Jun 10, 1961 Referring Provider (PT): Danella Sensing, NP   Encounter Date: 09/24/2020   PT End of Session - 09/25/20 1221    Visit Number 18   visit 7 for Medicare   Number of Visits 19    Date for PT Re-Evaluation 09/29/20    Authorization Type BCBS; pt has Medicare as of 07-27-20    Authorization Time Period 07-30-20 - 10-26-20    Progress Note Due on Visit 21   would be visit #10 for Medicare   PT Start Time 1540    PT Stop Time 1630    PT Time Calculation (min) 50 min    Equipment Utilized During Treatment Other (comment)   pool noodle   Activity Tolerance Patient tolerated treatment well    Behavior During Therapy [facility] for tasks assessed/performed           Past Medical History:  Diagnosis Date  . Anxiety   . Back pain    related to spinal stenosis and disc problem, radiates down left buttocks to leg., weakness occ.  [name] Dyspnea   . Headache   . Hyperlipidemia   . Hypertension   . Lumbar post-laminectomy syndrome   . PONV (postoperative nausea and vomiting)   . Vaginal foreign object    "Uses Femring"    Past Surgical History:  Procedure Laterality Date  . ABDOMINAL HYSTERECTOMY    . CARDIAC CATHETERIZATION N/A 04/18/2015   Procedure: Left Heart Cath and Coronary Angiography;  Surgeon: [name], MD;  Location: [place] [facility];  Service: Cardiovascular;  Laterality: N/A;  . COLONOSCOPY W/ BIOPSIES AND POLYPECTOMY    . FOOT SURGERY Bilateral    [place] "bunion,bone spur, tendon" (1) -6'16, (1)-10'16  . IR EPIDUROGRAPHY  07/21/2018  . LUMBAR LAMINECTOMY/DECOMPRESSION MICRODISCECTOMY Bilateral 12/28/2015   Procedure: MICRO LUMBAR DECOMPRESSION L4 - L5 BILATERALLY;  Surgeon: [name], MD;   Location[facility];  Service: Orthopedics;  Laterality: Bilateral;  . LUMBAR LAMINECTOMY/DECOMPRESSION MICRODISCECTOMY Bilateral 03/04/2018   Procedure: Revision of Microlumbar Decompression Bilateral Lumbar Four-Five;  Surgeon: [name], MD;  Location: [facility];  Service: Orthopedics;  Laterality: Bilateral;  90 mins  . SPINAL CORD STIMULATOR INSERTION N/A 09/28/2019   Procedure: THORACIC SPINAL CORD STIMULATOR INSERTION;  Surgeon: Melina Schools, MD;  Location: [facility];  Service: Orthopedics;  Laterality: N/A;  2.5 hrs  . TUBAL LIGATION    . WISDOM TOOTH EXTRACTION    . WOUND EXPLORATION N/A 03/04/2018   Procedure: EXPLORATION OF LUMBAR DECOMPRESSION WOUND;  Surgeon: [name], MD;  Location: [facility];  Service: Orthopedics;  Laterality: N/A;    There were no vitals filed for this visit.   Subjective Assessment - 09/25/20 1218    Subjective Pt states back pain has not been too bad lately - laid down on Sunday after having had busy Thanksgiving weekend and used heating pad on it - felt better afterward    Patient Stated Goals improve leg strength and balance through aquatic therapy    Currently in Pain? Yes    Pain Score 5     Pain Location Back    Pain Orientation Right    Pain Descriptors / Indicators Aching;Discomfort;Dull    Pain Type Chronic pain    Pain Onset More than a month ago  Pain Frequency Intermittent                   Aquatic therapy at [facility] - pool temp 86.4 degrees   Patient seen for aquatic therapy today.  Treatment took place in water 3.5-4 feet deep depending upon activity.  Pt entered and  Exited the pool via ramp negotiation with use of bilateral hand rails with supervision.  Pt performed runner's stretch on each leg and heel cord stretch bil. LE's with feet on pool wall - 30 sec hold each stretch x 1 rep Pt performed amb. 11m  across pool approx. 8 reps with bil. UE support on pool noodle  Pt performed bil. Heel raises 15 reps; performed squats 10  reps bil. LE's with UE support on pool wall  Pt performed marching in place with UE support 15 reps each leg  Pt performed standing hip flexion, abduction, and extension 15 reps each leg with UE support for LE strengthening;  Pt requires aquatic therapy for the unweighting provided by the buoyancy of the water and needs the viscosity for strengthening LE's and trunk musc.; buoyancy of water needed for spinal decompression for increased tolerance for movement with less pain experienced than with land exercises                                    PT Short Term Goals - 09/25/20 1227      PT SHORT TERM GOAL #1   Title Pt will report decreased pain in low and Rt hip area after aquatic therapy to </= 3/10 for increased ease with mobility.    Baseline 4/10 on 07-30-20    Time 4    Period Weeks    Status On-going    Target Date 08/27/20   target date extended due to wait list for aquatic therapy     PT SHORT TERM GOAL #2   Title Pt will negotiate steps into and out of pool with SBA  with use of hand rails using a step over step sequence.    Time 4    Period Weeks    Status New    Target Date 08/27/20             PT Long Term Goals - 09/25/20 1227      PT LONG TERM GOAL #1   Title Pt will be independent in HEP for aquatic exercises to be continued upon discharge from PT.    Time 8    Period Weeks    Status On-going      PT LONG TERM GOAL #2   Title Pt will report 50% decrease in pain (</= 2/10 intensity) in low back/Rt hip region for increased comfort and ease with mobility and ADL's.    Baseline 4/10 intensity on 07-30-20    Time 8    Period Weeks    Status On-going      PT LONG TERM GOAL #3   Title Perform sit to stand from bench in pool 3 times without UE support to demo increased RLE strength.    Time 8    Period Weeks    Status New                 Plan - 09/25/20 1222    Clinical Impression Statement Aquatic therapy session  focused on gait training and LE strengthening and stretching.  Pt continues to decline use of  buoyant aquatic cuffs on RLE due to pulling sensation in low back produced with use of these cuffs; therefore, pt performs AROM in standing using viscosity of water only for strengthening.  Pt continues to need UE support with gait training in 4' water depth.  Cont with POC.    Personal Factors and Comorbidities Past/Current Experience;Comorbidity 2;Time since onset of injury/illness/exacerbation    Examination-Activity Limitations Stand;Locomotion Level;Bend;Squat;Stairs;Transfers;Carry    Examination-Participation Restrictions Cleaning;Community Activity;Meal Prep;Shop    Stability/Clinical Decision Making Stable/Uncomplicated    Rehab Potential Good    PT Frequency 1x / week    PT Duration 8 weeks    PT Treatment/Interventions Manual techniques;Patient/family education;Therapeutic exercise;Therapeutic activities;Gait training;ADLs/Self Care Home Management;Neuromuscular re-education;Functional mobility training;Passive range of motion;Balance training;Aquatic Therapy;Biofeedback;DME Instruction;Stair training;Orthotic Fit/Training;Dry needling;Moist Heat;Scar mobilization    PT Next Visit Plan cont aquatic therapy    Consulted and Agree with Plan of Care Patient           Patient will benefit from skilled therapeutic intervention in order to improve the following deficits and impairments:  Pain, Postural dysfunction, Difficulty walking, Decreased strength, Decreased activity tolerance, Abnormal gait, Increased muscle spasms, Decreased endurance, Decreased balance, Decreased coordination, Decreased knowledge of use of DME, Decreased mobility, Decreased safety awareness, Impaired sensation, Impaired tone  Visit Diagnosis: Other abnormalities of gait and mobility  Unsteadiness on feet  Muscle weakness (generalized)     Problem List Patient Active Problem List   Diagnosis Date Noted  . Chronic  pain 09/28/2019  . Lumbar spinal stenosis   . Radicular pain   . Gait disorder   . Difficulty in urination 07/13/2018  . Back pain 07/12/2018  . Hyperlipidemia 04/16/2018  . Anxiety disorder, unspecified 04/16/2018  . AKI (acute kidney injury) ([place])   . Benign essential HTN   . Major depression, recurrent ([place])   . Lumbar radiculopathy 03/09/2018  . Paraparesis ([place]) 03/09/2018  . Essential hypertension   . Chronic nonintractable headache   . Leukocytosis   . Acute blood loss anemia   . Postoperative pain   . Generalized weakness   . Spinal stenosis at L4-L5 level 03/04/2018  . Spinal stenosis of lumbar region 12/28/2015  . Chest pain 04/16/2015    Kileigh Ortmann, [place], [place], [place] 09/25/2020, 12:29 PM  [facility] [address] [place], [name], [address] Phone: [phone]   Fax:  [phone]  Name: Aamani Moose MRN: [id number] Date of Birth: 11-May-1961

## 2020-10-01 ENCOUNTER — Other Ambulatory Visit: Payer: Self-pay

## 2020-10-01 ENCOUNTER — Ambulatory Visit: Payer: PPO | Attending: Registered Nurse | Admitting: Physical Therapy

## 2020-10-01 DIAGNOSIS — M6281 Muscle weakness (generalized): Secondary | ICD-10-CM | POA: Insufficient documentation

## 2020-10-01 DIAGNOSIS — R2681 Unsteadiness on feet: Secondary | ICD-10-CM | POA: Diagnosis not present

## 2020-10-01 DIAGNOSIS — R2689 Other abnormalities of gait and mobility: Secondary | ICD-10-CM | POA: Diagnosis not present

## 2020-10-02 NOTE — Therapy (Signed)
[facility] [address] [place], [name], [address] Phone: [phone]   Fax:  [phone]  Physical Therapy Treatment  Patient Details  Name: [name] MRN: [id number] Date of Birth: 1960-12-15 Referring Provider (PT): Danella Sensing, NP   Encounter Date: 10/01/2020   PT End of Session - 10/02/20 2115    Visit Number 19    Number of Visits 19    Date for PT Re-Evaluation 09/29/20    Authorization Type BCBS; pt has Medicare as of 07-27-20    Authorization Time Period 07-30-20 - 10-26-20    Progress Note Due on Visit 21   would be visit #10 for Medicare   PT Start Time 1550    PT Stop Time 1615    PT Time Calculation (min) 25 min    Equipment Utilized During Treatment Other (comment)   pool noodle   Activity Tolerance Patient tolerated treatment well    Behavior During Therapy [facility] for tasks assessed/performed           Past Medical History:  Diagnosis Date  . Anxiety   . Back pain    related to spinal stenosis and disc problem, radiates down left buttocks to leg., weakness occ.  [name] Dyspnea   . Headache   . Hyperlipidemia   . Hypertension   . Lumbar post-laminectomy syndrome   . PONV (postoperative nausea and vomiting)   . Vaginal foreign object    "Uses Femring"    Past Surgical History:  Procedure Laterality Date  . ABDOMINAL HYSTERECTOMY    . CARDIAC CATHETERIZATION N/A 04/18/2015   Procedure: Left Heart Cath and Coronary Angiography;  Surgeon: [name], MD;  Location: [place] [facility];  Service: Cardiovascular;  Laterality: N/A;  . COLONOSCOPY W/ BIOPSIES AND POLYPECTOMY    . FOOT SURGERY Bilateral    [place] "bunion,bone spur, tendon" (1) -6'16, (1)-10'16  . IR EPIDUROGRAPHY  07/21/2018  . LUMBAR LAMINECTOMY/DECOMPRESSION MICRODISCECTOMY Bilateral 12/28/2015   Procedure: MICRO LUMBAR DECOMPRESSION L4 - L5 BILATERALLY;  Surgeon: [name], MD;  [facility];   Service: Orthopedics;  Laterality: Bilateral;  . LUMBAR LAMINECTOMY/DECOMPRESSION MICRODISCECTOMY Bilateral 03/04/2018   Procedure: Revision of Microlumbar Decompression Bilateral Lumbar Four-Five;  Surgeon: [name], MD;  Location: [place];  Service: Orthopedics;  Laterality: Bilateral;  90 mins  . SPINAL CORD STIMULATOR INSERTION N/A 09/28/2019   Procedure: THORACIC SPINAL CORD STIMULATOR INSERTION;  Surgeon: Melina Schools, MD;  Location: [place];  Service: Orthopedics;  Laterality: N/A;  2.5 hrs  . TUBAL LIGATION    . WISDOM TOOTH EXTRACTION    . WOUND EXPLORATION N/A 03/04/2018   Procedure: EXPLORATION OF LUMBAR DECOMPRESSION WOUND;  Surgeon: [name], MD;  Location: [facility];  Service: Orthopedics;  Laterality: N/A;    There were no vitals filed for this visit.   Subjective Assessment - 10/02/20 2113    Subjective Pt states she had increased back pain yesterday (Sunday); states she is tired today because she took a pain pill prior to PT session    Patient Stated Goals improve leg strength and balance through aquatic therapy    Currently in Pain? Yes    Pain Score 6     Pain Location Back    Pain Orientation Right    Pain Descriptors / Indicators Aching;Discomfort;Dull    Pain Onset More than a month ago                  Aquatic therapy  at [facility] - pool temp 87.0 degrees   Patient seen for aquatic therapy today.  Treatment took place in water 3.5-4 feet deep depending upon activity.  Pt entered and  Exited the pool via ramp negotiation with use of bilateral hand rails with supervision with entering; mod assist with exiting due to decr. Alertness.  Pt performed runner's stretch on each leg and heel cord stretch bil. LE's with feet on pool wall - 30 sec hold each stretch x 1 rep Pt performed amb. 31m  across pool 1 rep with bil. UE support on pool noodle;  Pt amb. Approx. 15' on 2nd rep and reported that her RLE was giving out - pt stated she was very tired - was assisted  to steps in pool where she rested  Then assisted out of pool due to not feeling well - session ended early due to feelings of malaise and fatigue (due to having taken pain medication prior to appt)   Pt requires aquatic therapy for the unweighting provided by the buoyancy of the water and needs the viscosity for strengthening LE's and trunk musc.; buoyancy of water needed for spinal decompression for increased tolerance for movement with less pain experienced than with land exercises                               PT Short Term Goals - 10/02/20 2121      PT SHORT TERM GOAL #1   Title Pt will report decreased pain in low and Rt hip area after aquatic therapy to </= 3/10 for increased ease with mobility.    Baseline 4/10 on 07-30-20    Time 4    Period Weeks    Status On-going    Target Date 08/27/20   target date extended due to wait list for aquatic therapy     PT SHORT TERM GOAL #2   Title Pt will negotiate steps into and out of pool with SBA  with use of hand rails using a step over step sequence.    Time 4    Period Weeks    Status New    Target Date 08/27/20             PT Long Term Goals - 10/02/20 2122      PT LONG TERM GOAL #1   Title Pt will be independent in HEP for aquatic exercises to be continued upon discharge from PT.    Time 8    Period Weeks    Status On-going      PT LONG TERM GOAL #2   Title Pt will report 50% decrease in pain (</= 2/10 intensity) in low back/Rt hip region for increased comfort and ease with mobility and ADL's.    Baseline 4/10 intensity on 07-30-20    Time 8    Period Weeks    Status On-going      PT LONG TERM GOAL #3   Title Perform sit to stand from bench in pool 3 times without UE support to demo increased RLE strength.    Time 8    Period Weeks    Status New                 Plan - 10/02/20 2116    Clinical Impression Statement Participation in aquatic therapy session was limited by grogginess  due to pt having taken pain medication prior to scheduled appt.  Session was ended early  due to pt being fatigued and having decreased alertness.  Pt was assisted out of pool and to locker room; increased alertness noted after approx. 15" sitting.  Pt able to independently change her clothes. Cont with POC.    Personal Factors and Comorbidities Past/Current Experience;Comorbidity 2;Time since onset of injury/illness/exacerbation    Examination-Activity Limitations Stand;Locomotion Level;Bend;Squat;Stairs;Transfers;Carry    Examination-Participation Restrictions Cleaning;Community Activity;Meal Prep;Shop    Stability/Clinical Decision Making Stable/Uncomplicated    Rehab Potential Good    PT Frequency 1x / week    PT Duration 8 weeks    PT Treatment/Interventions Manual techniques;Patient/family education;Therapeutic exercise;Therapeutic activities;Gait training;ADLs/Self Care Home Management;Neuromuscular re-education;Functional mobility training;Passive range of motion;Balance training;Aquatic Therapy;Biofeedback;DME Instruction;Stair training;Orthotic Fit/Training;Dry needling;Moist Heat;Scar mobilization    PT Next Visit Plan cont aquatic therapy    Consulted and Agree with Plan of Care Patient           Patient will benefit from skilled therapeutic intervention in order to improve the following deficits and impairments:  Pain, Postural dysfunction, Difficulty walking, Decreased strength, Decreased activity tolerance, Abnormal gait, Increased muscle spasms, Decreased endurance, Decreased balance, Decreased coordination, Decreased knowledge of use of DME, Decreased mobility, Decreased safety awareness, Impaired sensation, Impaired tone  Visit Diagnosis: Other abnormalities of gait and mobility  Unsteadiness on feet  Muscle weakness (generalized)     Problem List Patient Active Problem List   Diagnosis Date Noted  . Chronic pain 09/28/2019  . Lumbar spinal stenosis   . Radicular  pain   . Gait disorder   . Difficulty in urination 07/13/2018  . Back pain 07/12/2018  . Hyperlipidemia 04/16/2018  . Anxiety disorder, unspecified 04/16/2018  . AKI (acute kidney injury) ([place])   . Benign essential HTN   . Major depression, recurrent ([place])   . Lumbar radiculopathy 03/09/2018  . Paraparesis ([place]) 03/09/2018  . Essential hypertension   . Chronic nonintractable headache   . Leukocytosis   . Acute blood loss anemia   . Postoperative pain   . Generalized weakness   . Spinal stenosis at L4-L5 level 03/04/2018  . Spinal stenosis of lumbar region 12/28/2015  . Chest pain 04/16/2015    Loneta Tamplin, [place], [place], [facility] 10/02/2020, 9:23 PM  [facility] [address] [place], [name], [address] Phone: [phone]   Fax:  [phone]  Name: [name] MRN: [id number] Date of Birth: 16-May-1961

## 2020-10-15 ENCOUNTER — Other Ambulatory Visit: Payer: Self-pay

## 2020-10-15 ENCOUNTER — Ambulatory Visit: Payer: PPO | Admitting: Physical Therapy

## 2020-10-15 DIAGNOSIS — R2689 Other abnormalities of gait and mobility: Secondary | ICD-10-CM | POA: Diagnosis not present

## 2020-10-15 DIAGNOSIS — R2681 Unsteadiness on feet: Secondary | ICD-10-CM

## 2020-10-15 DIAGNOSIS — M6281 Muscle weakness (generalized): Secondary | ICD-10-CM

## 2020-10-16 ENCOUNTER — Encounter: Payer: Self-pay | Admitting: Physical Therapy

## 2020-10-16 NOTE — Therapy (Signed)
[facility] [address] [place], [name], [address] Phone: [phone]   Fax:  [phone]  Physical Therapy Treatment and Progress Note (9th visit)   Reporting Period:  07-30-20 - 10-15-20 (9 visits) See below for progress towards goals  Patient Details  Name: [name] MRN: [id number] Date of Birth: 1961/09/16 Referring Provider (PT): Danella Sensing, NP   Encounter Date: 10/15/2020   PT End of Session - 10/16/20 1945    Visit Number 20    Number of Visits 22    Date for PT Re-Evaluation 10/26/20    Authorization Type BCBS; pt has Medicare as of 07-27-20    Authorization Time Period 07-30-20 - 10-26-20    Progress Note Due on Visit 21   would be visit #10 for Medicare   PT Start Time 1550    PT Stop Time 1635    PT Time Calculation (min) 45 min    Equipment Utilized During Treatment Other (comment)   bar bells   Activity Tolerance Patient tolerated treatment well    Behavior During Therapy [facility] for tasks assessed/performed           Past Medical History:  Diagnosis Date  . Anxiety   . Back pain    related to spinal stenosis and disc problem, radiates down left buttocks to leg., weakness occ.  [name] Dyspnea   . Headache   . Hyperlipidemia   . Hypertension   . Lumbar post-laminectomy syndrome   . PONV (postoperative nausea and vomiting)   . Vaginal foreign object    "Uses Femring"    Past Surgical History:  Procedure Laterality Date  . ABDOMINAL HYSTERECTOMY    . CARDIAC CATHETERIZATION N/A 04/18/2015   Procedure: Left Heart Cath and Coronary Angiography;  Surgeon: [name], MD;  Location: [place] [facility];  Service: Cardiovascular;  Laterality: N/A;  . COLONOSCOPY W/ BIOPSIES AND POLYPECTOMY    . FOOT SURGERY Bilateral    [place] "bunion,bone spur, tendon" (1) -6'16, (1)-10'16  . IR EPIDUROGRAPHY  07/21/2018  . LUMBAR LAMINECTOMY/DECOMPRESSION MICRODISCECTOMY Bilateral  12/28/2015   Procedure: MICRO LUMBAR DECOMPRESSION L4 - L5 BILATERALLY;  Surgeon: [name], MD;  [facility];  Service: Orthopedics;  Laterality: Bilateral;  . LUMBAR LAMINECTOMY/DECOMPRESSION MICRODISCECTOMY Bilateral 03/04/2018   Procedure: Revision of Microlumbar Decompression Bilateral Lumbar Four-Five;  Surgeon: [name], MD;  Location: [place];  Service: Orthopedics;  Laterality: Bilateral;  90 mins  . SPINAL CORD STIMULATOR INSERTION N/A 09/28/2019   Procedure: THORACIC SPINAL CORD STIMULATOR INSERTION;  Surgeon: Melina Schools, MD;  Location: [place];  Service: Orthopedics;  Laterality: N/A;  2.5 hrs  . TUBAL LIGATION    . WISDOM TOOTH EXTRACTION    . WOUND EXPLORATION N/A 03/04/2018   Procedure: EXPLORATION OF LUMBAR DECOMPRESSION WOUND;  Surgeon: [name], MD;  Location: [facility];  Service: Orthopedics;  Laterality: N/A;    There were no vitals filed for this visit.   Subjective Assessment - 10/16/20 1942    Subjective Pt states she is feeling pretty good today; reports she turned her pain stimulator up to 12 (says max is 15) and this intensity setting seems to be helping to control her back pain    Patient Stated Goals improve leg strength and balance through aquatic therapy    Currently in Pain? Yes    Pain Score 4     Pain Location Back    Pain Orientation Right    Pain Descriptors /  Indicators Aching;Discomfort;Dull    Pain Type Chronic pain    Pain Onset More than a month ago             Aquatic therapy at [facility] - pool temp 87.4 degrees   Patient seen for aquatic therapy today.  Treatment took place in water 3.5-4 feet deep depending upon activity.  Pt entered and  Exited the pool via ramp negotiation with use of bilateral hand rails with supervision.  Pt performed runner's stretch on each leg and heel cord stretch bil. LE's with feet on pool wall - 30 sec hold each stretch x 1 rep  Pt performed amb. 62m  across pool approx. 6 reps with bil. UE support on  bar bells Performed side stepping 92m x 2 reps with bil. UE support on bar bells   Pt performed squats 10 reps bil. LE's with UE support on pool wall  Pt performed marching in place with UE support 15 reps each leg  Pt performed standing hip flexion, abduction, and extension 15 reps each leg with UE support for LE strengthening;  Pt requires aquatic therapy for the unweighting provided by the buoyancy of the water and needs the viscosity for strengthening LE's and trunk musc.; buoyancy of water needed for spinal decompression for increased tolerance for movement with less pain experienced than with land exercises                                 PT Short Term Goals - 10/16/20 1955      PT SHORT TERM GOAL #1   Title Pt will report decreased pain in low and Rt hip area after aquatic therapy to </= 3/10 for increased ease with mobility.    Baseline 4/10 on 07-30-20    Time 4    Period Weeks    Status On-going    Target Date 08/27/20   target date extended due to wait list for aquatic therapy     PT SHORT TERM GOAL #2   Title Pt will negotiate steps into and out of pool with SBA  with use of hand rails using a step over step sequence.    Time 4    Period Weeks    Status New    Target Date 08/27/20             PT Long Term Goals - 10/16/20 1955      PT LONG TERM GOAL #1   Title Pt will be independent in HEP for aquatic exercises to be continued upon discharge from PT.    Time 8    Period Weeks    Status On-going      PT LONG TERM GOAL #2   Title Pt will report 50% decrease in pain (</= 2/10 intensity) in low back/Rt hip region for increased comfort and ease with mobility and ADL's.    Baseline 4/10 intensity on 07-30-20    Time 8    Period Weeks    Status On-going      PT LONG TERM GOAL #3   Title Perform sit to stand from bench in pool 3 times without UE support to demo increased RLE strength.    Time 8    Period Weeks    Status New                  Plan - 10/16/20 1948    Clinical Impression Statement Pt tolerated  aquatic exercise well with gait training in 4' water depth with UE support on bar bells with stabilization provided by PT.  Pt able to perform sideways amb. in addition to forward amb.   Bil. hip AROM exercises performed with use of viscosity of water only for strengthening - did not use aquatic cuffs due to pt reporting increased strain on Rt hip with performing active hip ROM exercises.  Plan to renew for 2 visits.    PT Next Visit Plan cont aquatic therapy           Patient will benefit from skilled therapeutic intervention in order to improve the following deficits and impairments:     Visit Diagnosis: Other abnormalities of gait and mobility  Unsteadiness on feet  Muscle weakness (generalized)     Problem List Patient Active Problem List   Diagnosis Date Noted  . Chronic pain 09/28/2019  . Lumbar spinal stenosis   . Radicular pain   . Gait disorder   . Difficulty in urination 07/13/2018  . Back pain 07/12/2018  . Hyperlipidemia 04/16/2018  . Anxiety disorder, unspecified 04/16/2018  . AKI (acute kidney injury) (HCC)   . Benign essential HTN   . Major depression, recurrent (HCC)   . Lumbar radiculopathy 03/09/2018  . Paraparesis (HCC) 03/09/2018  . Essential hypertension   . Chronic nonintractable headache   . Leukocytosis   . Acute blood loss anemia   . Postoperative pain   . Generalized weakness   . Spinal stenosis at L4-L5 level 03/04/2018  . Spinal stenosis of lumbar region 12/28/2015  . Chest pain 04/16/2015    Rashan Patient, Donavan Burnet, PT, ATRIC 10/16/2020, 7:58 PM  [facility] [address] [place], [place], [address] Phone: [phone]   Fax:  [phone]  Name: Sheridyn Canino MRN: [id number] Date of Birth: 1961/02/10

## 2020-10-22 ENCOUNTER — Ambulatory Visit: Payer: PPO | Admitting: Physical Therapy

## 2020-10-22 ENCOUNTER — Other Ambulatory Visit: Payer: Self-pay

## 2020-10-22 DIAGNOSIS — R2689 Other abnormalities of gait and mobility: Secondary | ICD-10-CM

## 2020-10-22 DIAGNOSIS — R2681 Unsteadiness on feet: Secondary | ICD-10-CM

## 2020-10-22 DIAGNOSIS — M6281 Muscle weakness (generalized): Secondary | ICD-10-CM

## 2020-10-23 NOTE — Therapy (Signed)
[facility] [address] [place], [name], [address] Phone: [phone]   Fax:  [phone]  Physical Therapy Treatment  Patient Details  Name: [name] MRN: [id number] Date of Birth: 1960-11-09 Referring Provider (PT): Danella Sensing, NP   Encounter Date: 10/22/2020   PT End of Session - 10/23/20 2018    Visit Number 21   visit 10 for Medicare   Number of Visits 22    Date for PT Re-Evaluation 10/26/20    Authorization Type BCBS; pt has Medicare as of 07-27-20    Authorization Time Period 07-30-20 - 10-26-20    Progress Note Due on Visit 30   would be visit #10 for Medicare   PT Start Time 1525    PT Stop Time 1625    PT Time Calculation (min) 60 min    Equipment Utilized During Treatment Other (comment)   bar bells, pool noodle, ankle cuffs   Activity Tolerance Patient tolerated treatment well    Behavior During Therapy [facility] for tasks assessed/performed           Past Medical History:  Diagnosis Date  . Anxiety   . Back pain    related to spinal stenosis and disc problem, radiates down left buttocks to leg., weakness occ.  [name] Dyspnea   . Headache   . Hyperlipidemia   . Hypertension   . Lumbar post-laminectomy syndrome   . PONV (postoperative nausea and vomiting)   . Vaginal foreign object    "Uses Femring"    Past Surgical History:  Procedure Laterality Date  . ABDOMINAL HYSTERECTOMY    . CARDIAC CATHETERIZATION N/A 04/18/2015   Procedure: Left Heart Cath and Coronary Angiography;  Surgeon: [name], MD;  Location: [place] [facility];  Service: Cardiovascular;  Laterality: N/A;  . COLONOSCOPY W/ BIOPSIES AND POLYPECTOMY    . FOOT SURGERY Bilateral    [place] "bunion,bone spur, tendon" (1) -6'16, (1)-10'16  . IR EPIDUROGRAPHY  07/21/2018  . LUMBAR LAMINECTOMY/DECOMPRESSION MICRODISCECTOMY Bilateral 12/28/2015   Procedure: MICRO LUMBAR DECOMPRESSION L4 - L5 BILATERALLY;   Surgeon: [name], MD;  [facility];  Service: Orthopedics;  Laterality: Bilateral;  . LUMBAR LAMINECTOMY/DECOMPRESSION MICRODISCECTOMY Bilateral 03/04/2018   Procedure: Revision of Microlumbar Decompression Bilateral Lumbar Four-Five;  Surgeon: [name], MD;  Location: [facility];  Service: Orthopedics;  Laterality: Bilateral;  90 mins  . SPINAL CORD STIMULATOR INSERTION N/A 09/28/2019   Procedure: THORACIC SPINAL CORD STIMULATOR INSERTION;  Surgeon: Melina Schools, MD;  Location: [facility];  Service: Orthopedics;  Laterality: N/A;  2.5 hrs  . TUBAL LIGATION    . WISDOM TOOTH EXTRACTION    . WOUND EXPLORATION N/A 03/04/2018   Procedure: EXPLORATION OF LUMBAR DECOMPRESSION WOUND;  Surgeon: [name], MD;  Location: [facility];  Service: Orthopedics;  Laterality: N/A;    There were no vitals filed for this visit.   Subjective Assessment - 10/23/20 2015    Subjective Pt presents for aquatic therapy at [facility] - states she has been doing rather well lately    Patient Stated Goals improve leg strength and balance through aquatic therapy    Currently in Pain? Yes    Pain Score 4     Pain Location Back    Pain Orientation Lower;Right    Pain Descriptors / Indicators Aching;Discomfort;Dull    Pain Type Chronic pain    Pain Onset More than a month ago    Pain Frequency Constant  Aquatic therapy at [facility] - pool temp 87.2 degrees   Patient seen for aquatic therapy today.  Treatment took place in water 3.5-4 feet deep depending upon activity.  Pt entered and  Exited the pool via ramp negotiation with use of bilateral hand rails with supervision.  Pt performed runner's stretch on each leg and heel cord stretch bil. LE's with feet on pool wall - 30 sec hold each stretch x 1 rep Pt performed amb. 51m  across pool approx. 8 reps with bil. UE support on pool noodle  Pt performed bil. Heel raises 10 reps; performed squats 10 reps bil. LE's with UE support on pool wall  Pt performed  standing hip flexion, abduction, and extension 10 reps each leg with UE support for LE strengthening;  Pt requires aquatic therapy for the unweighting provided by the buoyancy of the water and needs the viscosity for strengthening LE's and trunk musc.; buoyancy of water needed for spinal decompression for increased tolerance for movement with less pain experienced than with land exercises                                    PT Short Term Goals - 10/23/20 2030      PT SHORT TERM GOAL #1   Title Pt will report decreased pain in low and Rt hip area after aquatic therapy to </= 3/10 for increased ease with mobility.    Baseline 4/10 on 07-30-20    Time 4    Period Weeks    Status On-going    Target Date 08/27/20   target date extended due to wait list for aquatic therapy     PT SHORT TERM GOAL #2   Title Pt will negotiate steps into and out of pool with SBA  with use of hand rails using a step over step sequence.    Time 4    Period Weeks    Status Not Met    Target Date 08/27/20             PT Long Term Goals - 10/23/20 2030      PT LONG TERM GOAL #1   Title Pt will be independent in HEP for aquatic exercises to be continued upon discharge from PT.    Time 8    Period Weeks    Status On-going      PT LONG TERM GOAL #2   Title Pt will report 50% decrease in pain (</= 2/10 intensity) in low back/Rt hip region for increased comfort and ease with mobility and ADL's.    Baseline 4/10 intensity on 10-15-20    Time 8    Period Weeks    Status On-going      PT LONG TERM GOAL #3   Title Perform sit to stand from bench in pool 3 times without UE support to demo increased RLE strength.    Time 8    Period Weeks    Status On-going                 Plan - 10/23/20 2024    Clinical Impression Statement Pt continues to need bil. UE support on noodle for assistance with gait training in water.  RLE remains weaker than LLE with increased  tone/spasticity noted.  Pt declines to attempt use of buoyant ankle cuffs for LE strengthening, stating it produces a strain in her Rt hip and low back region  with the increased ROM produced with the buoyant ankle cuff.  Cont with POC.    PT Frequency 1x / week    PT Duration 2 weeks    PT Next Visit Plan cont aquatic therapy           Patient will benefit from skilled therapeutic intervention in order to improve the following deficits and impairments:     Visit Diagnosis: Other abnormalities of gait and mobility  Unsteadiness on feet  Muscle weakness (generalized)     Problem List Patient Active Problem List   Diagnosis Date Noted  . Chronic pain 09/28/2019  . Lumbar spinal stenosis   . Radicular pain   . Gait disorder   . Difficulty in urination 07/13/2018  . Back pain 07/12/2018  . Hyperlipidemia 04/16/2018  . Anxiety disorder, unspecified 04/16/2018  . AKI (acute kidney injury) ([place])   . Benign essential HTN   . Major depression, recurrent ([place])   . Lumbar radiculopathy 03/09/2018  . Paraparesis ([place]) 03/09/2018  . Essential hypertension   . Chronic nonintractable headache   . Leukocytosis   . Acute blood loss anemia   . Postoperative pain   . Generalized weakness   . Spinal stenosis at L4-L5 level 03/04/2018  . Spinal stenosis of lumbar region 12/28/2015  . Chest pain 04/16/2015    Coner Gibbard, [place], PT, ATRIC 10/23/2020, 8:31 PM  [facility] [address] [place], [name], [address] Phone: [phone]   Fax:  [phone]  Name: Lemma Tetro MRN: [id number] Date of Birth: 23-Mar-1961

## 2020-10-26 MED FILL — GABAPENTIN 400 MG CAPSULE: 400 | 30 days supply | Qty: 90 | Fill #2

## 2020-10-29 ENCOUNTER — Ambulatory Visit: Payer: PPO | Admitting: Physical Therapy

## 2020-11-05 ENCOUNTER — Other Ambulatory Visit (HOSPITAL_COMMUNITY): Payer: Self-pay | Admitting: Obstetrics

## 2020-11-05 DIAGNOSIS — E8941 Symptomatic postprocedural ovarian failure: Secondary | ICD-10-CM | POA: Diagnosis not present

## 2020-11-05 DIAGNOSIS — N951 Menopausal and female climacteric states: Secondary | ICD-10-CM | POA: Diagnosis not present

## 2020-11-05 DIAGNOSIS — Z1151 Encounter for screening for human papillomavirus (HPV): Secondary | ICD-10-CM | POA: Diagnosis not present

## 2020-11-05 DIAGNOSIS — Z01411 Encounter for gynecological examination (general) (routine) with abnormal findings: Secondary | ICD-10-CM | POA: Diagnosis not present

## 2020-11-05 DIAGNOSIS — Z90711 Acquired absence of uterus with remaining cervical stump: Secondary | ICD-10-CM | POA: Diagnosis not present

## 2020-11-05 DIAGNOSIS — Z79890 Hormone replacement therapy (postmenopausal): Secondary | ICD-10-CM | POA: Diagnosis not present

## 2020-11-05 DIAGNOSIS — Z6827 Body mass index (BMI) 27.0-27.9, adult: Secondary | ICD-10-CM | POA: Diagnosis not present

## 2020-11-05 DIAGNOSIS — Z113 Encounter for screening for infections with a predominantly sexual mode of transmission: Secondary | ICD-10-CM | POA: Diagnosis not present

## 2020-11-05 DIAGNOSIS — Z01419 Encounter for gynecological examination (general) (routine) without abnormal findings: Secondary | ICD-10-CM | POA: Diagnosis not present

## 2020-11-05 DIAGNOSIS — Z124 Encounter for screening for malignant neoplasm of cervix: Secondary | ICD-10-CM | POA: Diagnosis not present

## 2020-11-05 MED FILL — FEMRING 0.05 MG/24HR RING: 0.05 | 84 days supply | Qty: 1 | Fill #0

## 2020-11-08 MED FILL — ATORVASTATIN CALCIUM 20 MG: 20 | 90 days supply | Qty: 90 | Fill #2

## 2020-11-19 ENCOUNTER — Ambulatory Visit: Payer: PPO | Attending: Registered Nurse | Admitting: Physical Therapy

## 2020-11-19 ENCOUNTER — Other Ambulatory Visit: Payer: Self-pay

## 2020-11-19 DIAGNOSIS — M6281 Muscle weakness (generalized): Secondary | ICD-10-CM | POA: Diagnosis not present

## 2020-11-19 DIAGNOSIS — M5442 Lumbago with sciatica, left side: Secondary | ICD-10-CM | POA: Diagnosis not present

## 2020-11-19 DIAGNOSIS — R2681 Unsteadiness on feet: Secondary | ICD-10-CM | POA: Diagnosis not present

## 2020-11-19 DIAGNOSIS — G8929 Other chronic pain: Secondary | ICD-10-CM | POA: Diagnosis not present

## 2020-11-19 DIAGNOSIS — R2689 Other abnormalities of gait and mobility: Secondary | ICD-10-CM

## 2020-11-20 ENCOUNTER — Encounter: Payer: Self-pay | Admitting: Physical Therapy

## 2020-11-20 DIAGNOSIS — M5459 Other low back pain: Secondary | ICD-10-CM | POA: Diagnosis not present

## 2020-11-20 NOTE — Therapy (Signed)
Physical Therapy Treatment  Patient Details  Name: [name] MRN: [id number] Date of Birth: April 13, 1961 Referring Provider (PT): Danella Sensing, NP   Encounter Date: 11/19/2020   PT End of Session - 11/20/20 2158    Visit Number 22   visit 11 for Medicare   Number of Visits 22    Date for PT Re-Evaluation 10/26/20    Authorization Type BCBS; pt has Medicare as of 07-27-20    Authorization Time Period 07-30-20 - 10-26-20;  10-15-20 - 11-26-20    Progress Note Due on Visit 30   would be visit #10 for Medicare   PT Start Time 1545    PT Stop Time 1635    PT Time Calculation (min) 50 min    Equipment Utilized During Treatment Other (comment)   pool noodle   Activity Tolerance Patient tolerated treatment well    Behavior During Therapy [facility] for tasks assessed/performed           Past Medical History:  Diagnosis Date  . Anxiety   . Back pain    related to spinal stenosis and disc problem, radiates down left buttocks to leg., weakness occ.  [name] Dyspnea   . Headache   . Hyperlipidemia   . Hypertension   . Lumbar post-laminectomy syndrome   . PONV (postoperative nausea and vomiting)   . Vaginal foreign object    "Uses Femring"    Past Surgical History:  Procedure Laterality Date  . ABDOMINAL HYSTERECTOMY    . CARDIAC CATHETERIZATION N/A 04/18/2015   Procedure: Left Heart Cath and Coronary Angiography;  Surgeon: [name], MD;  Location: [place] [facility];  Service: Cardiovascular;  Laterality: N/A;  . COLONOSCOPY W/ BIOPSIES AND POLYPECTOMY    . FOOT SURGERY Bilateral    [place] "bunion,bone spur, tendon" (1) -6'16, (1)-10'16  . IR EPIDUROGRAPHY  07/21/2018  . LUMBAR LAMINECTOMY/DECOMPRESSION MICRODISCECTOMY Bilateral 12/28/2015   Procedure: MICRO LUMBAR DECOMPRESSION L4 - L5 BILATERALLY;  Surgeon: [name], MD;  [facility];  Service: Orthopedics;  Laterality: Bilateral;  . LUMBAR LAMINECTOMY/DECOMPRESSION MICRODISCECTOMY  Bilateral 03/04/2018   Procedure: Revision of Microlumbar Decompression Bilateral Lumbar Four-Five;  Surgeon: [name], MD;  Location: [facility];  Service: Orthopedics;  Laterality: Bilateral;  90 mins  . SPINAL CORD STIMULATOR INSERTION N/A 09/28/2019   Procedure: THORACIC SPINAL CORD STIMULATOR INSERTION;  Surgeon: Melina Schools, MD;  Location: [place];  Service: Orthopedics;  Laterality: N/A;  2.5 hrs  . TUBAL LIGATION    . WISDOM TOOTH EXTRACTION    . WOUND EXPLORATION N/A 03/04/2018   Procedure: EXPLORATION OF LUMBAR DECOMPRESSION WOUND;  Surgeon: [name], MD;  Location: [place];  Service: Orthopedics;  Laterality: N/A;    There were no vitals filed for this visit.   Subjective Assessment - 11/20/20 2139    Subjective Pt presents for aquatic therapy at [facility] - pt reports pain in Rt hip radiating down RLE at times; states she is going to see Dr. Rolena Infante to see if there is a problem with the pain stimulator    Patient Stated Goals improve leg strength and balance through aquatic therapy    Currently in Pain? Yes    Pain Score 7     Pain Location Back   Rt low back & Rt hip   Pain Orientation Right    Pain Descriptors / Indicators Aching;Discomfort;Dull    Pain Type Chronic pain    Pain Onset More than  a month ago    Pain Frequency Constant                  Aquatic therapy at [facility] - pool temp 87.4 degrees   Patient seen for aquatic therapy today.  Treatment took place in water 3.5-4 feet deep depending upon activity.  Pt entered and  Exited the pool via ramp negotiation with use of bilateral hand rails with supervision.  Pt performed runner's stretch on each leg and heel cord stretch bil. LE's with feet on pool wall - 30 sec hold each stretch x 1 rep  Pt performed amb. 63m  across pool approx. 6 reps with bil. UE support on pool noodle Performed side stepping 71m x 1 rep with bil. UE support on pool noodle  Pt performed squats 10 reps bil. LE's with UE support on pool  wall  Pt performed marching in place with UE support 10 reps each leg  Pt performed standing hip flexion, abduction, and extension 10 reps each leg with UE support for LE strengthening;  Pt requires aquatic therapy for the unweighting provided by the buoyancy of the water and needs the viscosity for strengthening LE's and trunk musc.; buoyancy of water needed for spinal decompression for increased tolerance for movement with less pain experienced than with land exercises                              PT Short Term Goals - 11/20/20 2207      PT SHORT TERM GOAL #1   Title Pt will report decreased pain in low and Rt hip area after aquatic therapy to </= 3/10 for increased ease with mobility.    Baseline 4/10 on 07-30-20    Time 4    Period Weeks    Status On-going    Target Date 08/27/20   target date extended due to wait list for aquatic therapy     PT SHORT TERM GOAL #2   Title Pt will negotiate steps into and out of pool with SBA  with use of hand rails using a step over step sequence.    Time 4    Period Weeks    Status Not Met    Target Date 08/27/20             PT Long Term Goals - 11/20/20 2207      PT LONG TERM GOAL #1   Title Pt will be independent in HEP for aquatic exercises to be continued upon discharge from PT.    Time 8    Period Weeks    Status On-going      PT LONG TERM GOAL #2   Title Pt will report 50% decrease in pain (</= 2/10 intensity) in low back/Rt hip region for increased comfort and ease with mobility and ADL's.    Baseline 4/10 intensity on 10-15-20    Time 8    Period Weeks    Status On-going      PT LONG TERM GOAL #3   Title Perform sit to stand from bench in pool 3 times without UE support to demo increased RLE strength.    Time 8    Period Weeks    Status On-going                 Plan - 11/20/20 2202    Clinical Impression Statement Pt reporting increased pain in Rt low back and hip region,  which  impacted pt's tolerance for Rt hip resisted exercises, with pt using viscosity of water only for resistance.  Pt cont to need bil. UE support on pool noodle for assistance with balance with water walking.  Pt states she will be making appt with Dr. Rolena Infante to determine if there is a problem with the pain stimulator (lead wires implanted in back).  Cont with POC.    PT Frequency 1x / week    PT Duration 2 weeks    PT Next Visit Plan cont aquatic therapy           Patient will benefit from skilled therapeutic intervention in order to improve the following deficits and impairments:     Visit Diagnosis: Other abnormalities of gait and mobility  Unsteadiness on feet  Muscle weakness (generalized)  Chronic left-sided low back pain with left-sided sciatica     Problem List Patient Active Problem List   Diagnosis Date Noted  . Chronic pain 09/28/2019  . Lumbar spinal stenosis   . Radicular pain   . Gait disorder   . Difficulty in urination 07/13/2018  . Back pain 07/12/2018  . Hyperlipidemia 04/16/2018  . Anxiety disorder, unspecified 04/16/2018  . AKI (acute kidney injury) ([place])   . Benign essential HTN   . Major depression, recurrent ([place])   . Lumbar radiculopathy 03/09/2018  . Paraparesis ([place]) 03/09/2018  . Essential hypertension   . Chronic nonintractable headache   . Leukocytosis   . Acute blood loss anemia   . Postoperative pain   . Generalized weakness   . Spinal stenosis at L4-L5 level 03/04/2018  . Spinal stenosis of lumbar region 12/28/2015  . Chest pain 04/16/2015    Zeina Akkerman, [place], PT, [place] 11/20/2020, 10:10 PM  [facility] [address] [place], [name], [address] Phone: [phone]   Fax:  [phone]  Name: [name] MRN: [id number] Date of Birth: Jan 20, 1961

## 2020-11-22 ENCOUNTER — Other Ambulatory Visit: Payer: Self-pay | Admitting: Family Medicine

## 2020-11-26 ENCOUNTER — Other Ambulatory Visit: Payer: Self-pay

## 2020-11-26 ENCOUNTER — Ambulatory Visit: Payer: PPO | Admitting: Physical Therapy

## 2020-11-26 ENCOUNTER — Encounter: Payer: Self-pay | Admitting: Physical Therapy

## 2020-11-26 DIAGNOSIS — R2689 Other abnormalities of gait and mobility: Secondary | ICD-10-CM | POA: Diagnosis not present

## 2020-11-26 DIAGNOSIS — M6281 Muscle weakness (generalized): Secondary | ICD-10-CM

## 2020-11-26 DIAGNOSIS — R2681 Unsteadiness on feet: Secondary | ICD-10-CM

## 2020-11-26 NOTE — Therapy (Addendum)
[facility] [address] [place], [name], [address] Phone: [phone]   Fax:  [phone]  Physical Therapy Treatment  Patient Details  Name: [name] MRN: [id number] Date of Birth: 09/11/1961 Referring Provider (PT): Danella Sensing, NP   Encounter Date: 11/26/2020   PT End of Session - 11/26/20 2153    Visit Number 23   visit 12 for Medicare   Number of Visits 22    Date for PT Re-Evaluation 10/26/20    Authorization Type BCBS; pt has Medicare as of 07-27-20    Authorization Time Period 07-30-20 - 10-26-20;  10-15-20 - 11-26-20    Progress Note Due on Visit 30   would be visit #10 for Medicare   PT Start Time 1545    PT Stop Time 1630    PT Time Calculation (min) 45 min    Equipment Utilized During Treatment Other (comment)   pool noodle   Activity Tolerance Patient tolerated treatment well    Behavior During Therapy [facility] for tasks assessed/performed           Past Medical History:  Diagnosis Date  . Anxiety   . Back pain    related to spinal stenosis and disc problem, radiates down left buttocks to leg., weakness occ.  [name] Dyspnea   . Headache   . Hyperlipidemia   . Hypertension   . Lumbar post-laminectomy syndrome   . PONV (postoperative nausea and vomiting)   . Vaginal foreign object    "Uses Femring"    Past Surgical History:  Procedure Laterality Date  . ABDOMINAL HYSTERECTOMY    . CARDIAC CATHETERIZATION N/A 04/18/2015   Procedure: Left Heart Cath and Coronary Angiography;  Surgeon: [name], MD;  Location: [place];  Service: Cardiovascular;  Laterality: N/A;  . COLONOSCOPY W/ BIOPSIES AND POLYPECTOMY    . FOOT SURGERY Bilateral    [place] "bunion,bone spur, tendon" (1) -6'16, (1)-10'16  . IR EPIDUROGRAPHY  07/21/2018  . LUMBAR LAMINECTOMY/DECOMPRESSION MICRODISCECTOMY Bilateral 12/28/2015   Procedure: MICRO LUMBAR DECOMPRESSION L4 - L5 BILATERALLY;  Surgeon:  [name], MD;  Location: [facility];  Service: Orthopedics;  Laterality: Bilateral;  . LUMBAR LAMINECTOMY/DECOMPRESSION MICRODISCECTOMY Bilateral 03/04/2018   Procedure: Revision of Microlumbar Decompression Bilateral Lumbar Four-Five;  Surgeon: [name], MD;  Location: [facility];  Service: Orthopedics;  Laterality: Bilateral;  90 mins  . SPINAL CORD STIMULATOR INSERTION N/A 09/28/2019   Procedure: THORACIC SPINAL CORD STIMULATOR INSERTION;  Surgeon: Melina Schools, MD;  Location: [facility];  Service: Orthopedics;  Laterality: N/A;  2.5 hrs  . TUBAL LIGATION    . WISDOM TOOTH EXTRACTION    . WOUND EXPLORATION N/A 03/04/2018   Procedure: EXPLORATION OF LUMBAR DECOMPRESSION WOUND;  Surgeon: [name], MD;  Location: [facility];  Service: Orthopedics;  Laterality: N/A;    There were no vitals filed for this visit.   Subjective Assessment - 11/26/20 2151    Subjective Pt states Rt leg has been very painful recently - rated it 10/10 last Saturday, but states it may have been due to the cold weather; has appt with Dr. Nelva Bush Feb. 25 for injection - may have MRI done if the injection does not help    Patient Stated Goals improve leg strength and balance through aquatic therapy    Currently in Pain? Yes    Pain Score 7     Pain Location Back    Pain Orientation Right    Pain Descriptors /  Indicators Aching;Discomfort;Dull    Pain Type Chronic pain    Pain Onset More than a month ago    Pain Frequency Constant                   Aquatic therapy at [facility]   Patient seen for aquatic therapy today.  Treatment took place in water 3.6-4.8 feet deep depending upon activity.  Pt entered and  Exited the pool via step negotiation with use of bilateral hand rails with CGA (step height approx. 4" - 6 steps at entry)  Pt performed runner's stretch on each leg and heel cord stretch bil. LE's with feet on pool wall - 30 sec hold each stretch x 1 rep Pt performed amb. Approx.  30'   across pool approx. 10 reps with bil. UE support on pool noodle, stabilized by PT  Pt performed squats 10 reps bil. LE's with UE support on pool wall  Pt performed standing hip flexion, abduction, and extension 10 reps each leg with UE support for LE strengthening;  Pt requires aquatic therapy for the unweighting provided by the buoyancy of the water and needs the viscosity for strengthening LE's and trunk musc.; buoyancy of water needed for spinal decompression for increased tolerance for movement with less pain experienced than with land exercises                          PT Short Term Goals - 11/26/20 2154      PT SHORT TERM GOAL #1   Title Pt will report decreased pain in low and Rt hip area after aquatic therapy to </= 3/10 for increased ease with mobility.    Baseline 4/10 on 07-30-20    Time 4    Period Weeks    Status On-going    Target Date 08/27/20   target date extended due to wait list for aquatic therapy     PT SHORT TERM GOAL #2   Title Pt will negotiate steps into and out of pool with SBA  with use of hand rails using a step over step sequence.    Time 4    Period Weeks    Status Not Met    Target Date 08/27/20             PT Long Term Goals - 11/26/20 2155      PT LONG TERM GOAL #1   Title Pt will be independent in HEP for aquatic exercises to be continued upon discharge from PT.    Time 8    Period Weeks    Status On-going      PT LONG TERM GOAL #2   Title Pt will report 50% decrease in pain (</= 2/10 intensity) in low back/Rt hip region for increased comfort and ease with mobility and ADL's.    Baseline 4/10 intensity on 10-15-20    Time 8    Period Weeks    Status On-going      PT LONG TERM GOAL #3   Title Perform sit to stand from bench in pool 3 times without UE support to demo increased RLE strength.    Time 8    Period Weeks    Status On-going                 Plan - 11/26/20 2154    PT Frequency 1x / week    PT  Duration 4 weeks    PT Next  Visit Plan cont aquatic therapy           Patient will benefit from skilled therapeutic intervention in order to improve the following deficits and impairments:     Visit Diagnosis: Other abnormalities of gait and mobility  Unsteadiness on feet  Muscle weakness (generalized)     Problem List Patient Active Problem List   Diagnosis Date Noted  . Chronic pain 09/28/2019  . Lumbar spinal stenosis   . Radicular pain   . Gait disorder   . Difficulty in urination 07/13/2018  . Back pain 07/12/2018  . Hyperlipidemia 04/16/2018  . Anxiety disorder, unspecified 04/16/2018  . AKI (acute kidney injury) ([place])   . Benign essential HTN   . Major depression, recurrent ([place])   . Lumbar radiculopathy 03/09/2018  . Paraparesis ([place]) 03/09/2018  . Essential hypertension   . Chronic nonintractable headache   . Leukocytosis   . Acute blood loss anemia   . Postoperative pain   . Generalized weakness   . Spinal stenosis at L4-L5 level 03/04/2018  . Spinal stenosis of lumbar region 12/28/2015  . Chest pain 04/16/2015    Addasyn Mcbreen, [place], PT, [place] 11/26/2020, 9:56 PM  [facility] [address] [place], [name], [address] Phone: [phone]   Fax:  [phone]  Name: [name] MRN: [id number] Date of Birth: 24-Mar-1961

## 2020-11-29 MED FILL — CARVEDILOL 6.25 MG TABLET: 6.25 | 90 days supply | Qty: 180 | Fill #0

## 2020-12-10 ENCOUNTER — Other Ambulatory Visit: Payer: Self-pay

## 2020-12-10 ENCOUNTER — Ambulatory Visit: Payer: PPO | Attending: Registered Nurse | Admitting: Physical Therapy

## 2020-12-10 DIAGNOSIS — M6281 Muscle weakness (generalized): Secondary | ICD-10-CM | POA: Diagnosis not present

## 2020-12-10 DIAGNOSIS — R2681 Unsteadiness on feet: Secondary | ICD-10-CM | POA: Diagnosis not present

## 2020-12-10 DIAGNOSIS — R2689 Other abnormalities of gait and mobility: Secondary | ICD-10-CM | POA: Diagnosis not present

## 2020-12-11 ENCOUNTER — Encounter: Payer: Self-pay | Admitting: Physical Therapy

## 2020-12-11 NOTE — Therapy (Signed)
[facility] [address] [place], [name], [address] Phone: [phone]   Fax:  [phone]  Physical Therapy Treatment  Patient Details  Name: [name] MRN: [id number] Date of Birth: Mar 08, 1961 Referring Provider (PT): Danella Sensing, NP   Encounter Date: 12/10/2020   PT End of Session - 12/11/20 2223    Visit Number 24   visit 13 for Medicare   Number of Visits 22    Date for PT Re-Evaluation 10/26/20    Authorization Type BCBS; pt has Medicare as of 07-27-20    Authorization Time Period 07-30-20 - 10-26-20;  10-15-20 - 11-26-20    Progress Note Due on Visit 30   would be visit #10 for Medicare   PT Start Time 1545    PT Stop Time 1630    PT Time Calculation (min) 45 min    Equipment Utilized During Treatment Other (comment)   pool noodle   Activity Tolerance Patient tolerated treatment well    Behavior During Therapy [facility] for tasks assessed/performed           Past Medical History:  Diagnosis Date  . Anxiety   . Back pain    related to spinal stenosis and disc problem, radiates down left buttocks to leg., weakness occ.  [name] Dyspnea   . Headache   . Hyperlipidemia   . Hypertension   . Lumbar post-laminectomy syndrome   . PONV (postoperative nausea and vomiting)   . Vaginal foreign object    "Uses Femring"    Past Surgical History:  Procedure Laterality Date  . ABDOMINAL HYSTERECTOMY    . CARDIAC CATHETERIZATION N/A 04/18/2015   Procedure: Left Heart Cath and Coronary Angiography;  Surgeon: [name], MD;  Location: [facility];  Service: Cardiovascular;  Laterality: N/A;  . COLONOSCOPY W/ BIOPSIES AND POLYPECTOMY    . FOOT SURGERY Bilateral    [place] "bunion,bone spur, tendon" (1) -6'16, (1)-10'16  . IR EPIDUROGRAPHY  07/21/2018  . LUMBAR LAMINECTOMY/DECOMPRESSION MICRODISCECTOMY Bilateral 12/28/2015   Procedure: MICRO LUMBAR DECOMPRESSION L4 - L5 BILATERALLY;  Surgeon:  [name], MD;  [facility];  Service: Orthopedics;  Laterality: Bilateral;  . LUMBAR LAMINECTOMY/DECOMPRESSION MICRODISCECTOMY Bilateral 03/04/2018   Procedure: Revision of Microlumbar Decompression Bilateral Lumbar Four-Five;  Surgeon: [name], MD;  Location: [facility];  Service: Orthopedics;  Laterality: Bilateral;  90 mins  . SPINAL CORD STIMULATOR INSERTION N/A 09/28/2019   Procedure: THORACIC SPINAL CORD STIMULATOR INSERTION;  Surgeon: Melina Schools, MD;  Location: [facility];  Service: Orthopedics;  Laterality: N/A;  2.5 hrs  . TUBAL LIGATION    . WISDOM TOOTH EXTRACTION    . WOUND EXPLORATION N/A 03/04/2018   Procedure: EXPLORATION OF LUMBAR DECOMPRESSION WOUND;  Surgeon: [name], MD;  Location: [facility];  Service: Orthopedics;  Laterality: N/A;    There were no vitals filed for this visit.   Subjective Assessment - 12/11/20 2119    Subjective Pt states she has been doing OK - has appt with Dr. Nelva Bush next Friday (12-21-20)    Patient Stated Goals improve leg strength and balance through aquatic therapy    Currently in Pain? Yes    Pain Score 5     Pain Location Back    Pain Orientation Right    Pain Descriptors / Indicators Aching;Discomfort;Dull    Pain Onset More than a month ago    Pain Frequency Constant  Aquatic therapy at [facility]   Patient seen for aquatic therapy today.  Treatment took place in water 3.6-4.8 feet deep depending upon activity.  Pt entered and  Exited the pool via step negotiation with use of bilateral hand rails with CGA (step height approx. 4" - 6 steps at entry)  Pt performed runner's stretch on each leg and heel cord stretch bil. LE's with feet on pool wall - 30 sec hold each stretch x 1 rep Pt performed amb. Approx.  30'  across pool approx. 10 reps with bil. UE support on pool noodle, stabilized by PT  Pt performed squats 10 reps bil. LE's with UE support on pool wall  Pt performed standing hip flexion,  abduction, and extension 10 reps each leg with UE support for LE strengthening;  Marching in place 10 reps with UE support on pool wall for hip strengthening and for improved balance  Pt requires aquatic therapy for the unweighting provided by the buoyancy of the water and needs the viscosity for strengthening LE's and trunk musc.; buoyancy of water needed for spinal decompression for increased tolerance for movement with less pain experienced than with land exercises                                PT Short Term Goals - 12/11/20 2228      PT SHORT TERM GOAL #1   Title Pt will report decreased pain in low and Rt hip area after aquatic therapy to </= 3/10 for increased ease with mobility.    Baseline 4/10 on 07-30-20    Time 4    Period Weeks    Status On-going    Target Date 08/27/20   target date extended due to wait list for aquatic therapy     PT SHORT TERM GOAL #2   Title Pt will negotiate steps into and out of pool with SBA  with use of hand rails using a step over step sequence.    Time 4    Period Weeks    Status Not Met    Target Date 08/27/20             PT Long Term Goals - 12/11/20 2228      PT LONG TERM GOAL #1   Title Pt will be independent in HEP for aquatic exercises to be continued upon discharge from PT.    Time 8    Period Weeks    Status On-going      PT LONG TERM GOAL #2   Title Pt will report 50% decrease in pain (</= 2/10 intensity) in low back/Rt hip region for increased comfort and ease with mobility and ADL's.    Baseline 4/10 intensity on 10-15-20    Time 8    Period Weeks    Status On-going      PT LONG TERM GOAL #3   Title Perform sit to stand from bench in pool 3 times without UE support to demo increased RLE strength.    Time 8    Period Weeks    Status On-going                 Plan - 12/11/20 2224    Clinical Impression Statement Pt demonstrating increased strength and stability with aquatic exercise  as pt able to tolerate increased time with water walking prior to need for rest break and also able to perform increased AROM  bil. LE's with less c/o back pain.  No resistance used for standing hip exercises for increased comfort and to prevent strain on Rt hip with AROM. Cont with POC.    PT Frequency 1x / week    PT Duration 4 weeks    PT Next Visit Plan cont aquatic therapy           Patient will benefit from skilled therapeutic intervention in order to improve the following deficits and impairments:     Visit Diagnosis: Other abnormalities of gait and mobility  Unsteadiness on feet  Muscle weakness (generalized)     Problem List Patient Active Problem List   Diagnosis Date Noted  . Chronic pain 09/28/2019  . Lumbar spinal stenosis   . Radicular pain   . Gait disorder   . Difficulty in urination 07/13/2018  . Back pain 07/12/2018  . Hyperlipidemia 04/16/2018  . Anxiety disorder, unspecified 04/16/2018  . AKI (acute kidney injury) ([place])   . Benign essential HTN   . Major depression, recurrent ([place])   . Lumbar radiculopathy 03/09/2018  . Paraparesis ([place]) 03/09/2018  . Essential hypertension   . Chronic nonintractable headache   . Leukocytosis   . Acute blood loss anemia   . Postoperative pain   . Generalized weakness   . Spinal stenosis at L4-L5 level 03/04/2018  . Spinal stenosis of lumbar region 12/28/2015  . Chest pain 04/16/2015    Crissa Sowder, [place], [place], [place] 12/11/2020, 10:30 PM  [facility] [address] [place], [name], [address] Phone: [phone]   Fax:  [phone]  Name: Bryanda Mikel MRN: [id number] Date of Birth: November 23, 1960

## 2020-12-13 ENCOUNTER — Other Ambulatory Visit: Payer: Self-pay

## 2020-12-14 ENCOUNTER — Inpatient Hospital Stay (HOSPITAL_COMMUNITY)
Admission: EM | Admit: 2020-12-14 | Discharge: 2020-12-20 | DRG: 149 | Disposition: A | Discharge status: Home Health | Payer: PPO | Attending: Internal Medicine | Admitting: Internal Medicine

## 2020-12-14 ENCOUNTER — Observation Stay (HOSPITAL_COMMUNITY): Payer: PPO

## 2020-12-14 ENCOUNTER — Other Ambulatory Visit: Payer: Self-pay | Admitting: Family Medicine

## 2020-12-14 ENCOUNTER — Ambulatory Visit (INDEPENDENT_AMBULATORY_CARE_PROVIDER_SITE_OTHER): Payer: PPO | Admitting: Family Medicine

## 2020-12-14 ENCOUNTER — Encounter (HOSPITAL_COMMUNITY): Payer: Self-pay | Admitting: Emergency Medicine

## 2020-12-14 ENCOUNTER — Ambulatory Visit (INDEPENDENT_AMBULATORY_CARE_PROVIDER_SITE_OTHER): Payer: PPO

## 2020-12-14 ENCOUNTER — Encounter: Payer: Self-pay | Admitting: Family Medicine

## 2020-12-14 ENCOUNTER — Other Ambulatory Visit: Payer: Self-pay

## 2020-12-14 VITALS — BP 126/80 | HR 81 | Resp 16 | Ht 65.0 in

## 2020-12-14 DIAGNOSIS — Z880 Allergy status to penicillin: Secondary | ICD-10-CM

## 2020-12-14 DIAGNOSIS — M4800 Spinal stenosis, site unspecified: Secondary | ICD-10-CM | POA: Diagnosis present

## 2020-12-14 DIAGNOSIS — R1310 Dysphagia, unspecified: Secondary | ICD-10-CM

## 2020-12-14 DIAGNOSIS — R079 Chest pain, unspecified: Secondary | ICD-10-CM | POA: Diagnosis present

## 2020-12-14 DIAGNOSIS — Z800 Family history of malignant neoplasm of digestive organs: Secondary | ICD-10-CM | POA: Diagnosis not present

## 2020-12-14 DIAGNOSIS — M961 Postlaminectomy syndrome, not elsewhere classified: Secondary | ICD-10-CM | POA: Diagnosis present

## 2020-12-14 DIAGNOSIS — M21371 Foot drop, right foot: Secondary | ICD-10-CM | POA: Diagnosis present

## 2020-12-14 DIAGNOSIS — R279 Unspecified lack of coordination: Secondary | ICD-10-CM | POA: Diagnosis not present

## 2020-12-14 DIAGNOSIS — Z8249 Family history of ischemic heart disease and other diseases of the circulatory system: Secondary | ICD-10-CM

## 2020-12-14 DIAGNOSIS — Z20822 Contact with and (suspected) exposure to covid-19: Secondary | ICD-10-CM | POA: Diagnosis present

## 2020-12-14 DIAGNOSIS — R569 Unspecified convulsions: Secondary | ICD-10-CM | POA: Diagnosis not present

## 2020-12-14 DIAGNOSIS — Z808 Family history of malignant neoplasm of other organs or systems: Secondary | ICD-10-CM | POA: Diagnosis not present

## 2020-12-14 DIAGNOSIS — I10 Essential (primary) hypertension: Secondary | ICD-10-CM | POA: Diagnosis present

## 2020-12-14 DIAGNOSIS — F419 Anxiety disorder, unspecified: Secondary | ICD-10-CM | POA: Diagnosis present

## 2020-12-14 DIAGNOSIS — I422 Other hypertrophic cardiomyopathy: Secondary | ICD-10-CM | POA: Diagnosis present

## 2020-12-14 DIAGNOSIS — R42 Dizziness and giddiness: Secondary | ICD-10-CM

## 2020-12-14 DIAGNOSIS — Z9682 Presence of neurostimulator: Secondary | ICD-10-CM

## 2020-12-14 DIAGNOSIS — Z91040 Latex allergy status: Secondary | ICD-10-CM | POA: Diagnosis not present

## 2020-12-14 DIAGNOSIS — I700 Atherosclerosis of aorta: Secondary | ICD-10-CM | POA: Diagnosis not present

## 2020-12-14 DIAGNOSIS — Z801 Family history of malignant neoplasm of trachea, bronchus and lung: Secondary | ICD-10-CM | POA: Diagnosis not present

## 2020-12-14 DIAGNOSIS — Z91018 Allergy to other foods: Secondary | ICD-10-CM

## 2020-12-14 DIAGNOSIS — R55 Syncope and collapse: Secondary | ICD-10-CM

## 2020-12-14 DIAGNOSIS — G8929 Other chronic pain: Secondary | ICD-10-CM | POA: Diagnosis present

## 2020-12-14 DIAGNOSIS — I361 Nonrheumatic tricuspid (valve) insufficiency: Secondary | ICD-10-CM | POA: Diagnosis not present

## 2020-12-14 DIAGNOSIS — E785 Hyperlipidemia, unspecified: Secondary | ICD-10-CM | POA: Diagnosis present

## 2020-12-14 DIAGNOSIS — N179 Acute kidney failure, unspecified: Secondary | ICD-10-CM | POA: Diagnosis not present

## 2020-12-14 DIAGNOSIS — Z743 Need for continuous supervision: Secondary | ICD-10-CM | POA: Diagnosis not present

## 2020-12-14 DIAGNOSIS — Z888 Allergy status to other drugs, medicaments and biological substances status: Secondary | ICD-10-CM | POA: Diagnosis not present

## 2020-12-14 DIAGNOSIS — Z881 Allergy status to other antibiotic agents status: Secondary | ICD-10-CM | POA: Diagnosis not present

## 2020-12-14 DIAGNOSIS — R0789 Other chest pain: Secondary | ICD-10-CM | POA: Diagnosis not present

## 2020-12-14 DIAGNOSIS — R251 Tremor, unspecified: Secondary | ICD-10-CM | POA: Diagnosis present

## 2020-12-14 DIAGNOSIS — M48061 Spinal stenosis, lumbar region without neurogenic claudication: Secondary | ICD-10-CM | POA: Diagnosis not present

## 2020-12-14 DIAGNOSIS — Z807 Family history of other malignant neoplasms of lymphoid, hematopoietic and related tissues: Secondary | ICD-10-CM | POA: Diagnosis not present

## 2020-12-14 DIAGNOSIS — F32A Depression, unspecified: Secondary | ICD-10-CM | POA: Diagnosis present

## 2020-12-14 DIAGNOSIS — I6523 Occlusion and stenosis of bilateral carotid arteries: Secondary | ICD-10-CM | POA: Diagnosis not present

## 2020-12-14 DIAGNOSIS — H8110 Benign paroxysmal vertigo, unspecified ear: Principal | ICD-10-CM

## 2020-12-14 DIAGNOSIS — F411 Generalized anxiety disorder: Secondary | ICD-10-CM | POA: Diagnosis present

## 2020-12-14 DIAGNOSIS — Z803 Family history of malignant neoplasm of breast: Secondary | ICD-10-CM

## 2020-12-14 DIAGNOSIS — R1319 Other dysphagia: Secondary | ICD-10-CM | POA: Diagnosis not present

## 2020-12-14 LAB — BASIC METABOLIC PANEL
Anion gap: 8 (ref 5–15)
BUN: 12 mg/dL (ref 6–23)
BUN: 9 mg/dL (ref 6–20)
CO2: 33 mEq/L — ABNORMAL HIGH (ref 19–32)
CO2: 28 mmol/L (ref 22–32)
Calcium: 9.4 mg/dL (ref 8.4–10.5)
Calcium: 9 mg/dL (ref 8.9–10.3)
Chloride: 105 mEq/L (ref 96–112)
Chloride: 103 mmol/L (ref 98–111)
Creatinine, Ser: 1.07 mg/dL (ref 0.40–1.20)
Creatinine, Ser: 0.99 mg/dL (ref 0.44–1.00)
GFR, Estimated: >60 mL/min (ref >60)
GFR: 56.92 mL/min — ABNORMAL LOW (ref >60.00)
Glucose, Bld: 90 mg/dL (ref 70–99)
Glucose, Bld: 96 mg/dL (ref 70–99)
Potassium: 4.5 mEq/L (ref 3.5–5.1)
Potassium: 3.9 mmol/L (ref 3.5–5.1)
Sodium: 142 mEq/L (ref 135–145)
Sodium: 139 mmol/L (ref 135–145)

## 2020-12-14 LAB — TSH: TSH: 3.111 u[IU]/mL (ref 0.350–4.500)

## 2020-12-14 LAB — CBC
HCT: 37.4 % (ref 36.0–46.0)
HCT: 39.2 % (ref 36.0–46.0)
Hemoglobin: 12.3 g/dL (ref 12.0–15.0)
Hemoglobin: 12.6 g/dL (ref 12.0–15.0)
MCH: 29.4 pg (ref 26.0–34.0)
MCHC: 32.9 g/dL (ref 30.0–36.0)
MCHC: 32.1 g/dL (ref 30.0–36.0)
MCV: 90.1 fl (ref 78.0–100.0)
MCV: 91.6 fL (ref 80.0–100.0)
Platelets: 264 10*3/uL (ref 150.0–400.0)
Platelets: 284 10*3/uL (ref 150–400)
RBC: 4.15 Mil/uL (ref 3.87–5.11)
RBC: 4.28 MIL/uL (ref 3.87–5.11)
RDW: 13.6 % (ref 11.5–15.5)
RDW: 13.3 % (ref 11.5–15.5)
WBC: 3.9 10*3/uL — ABNORMAL LOW (ref 4.0–10.5)
WBC: 4.3 10*3/uL (ref 4.0–10.5)
nRBC: 0 % (ref 0.0–0.2)

## 2020-12-14 LAB — RAPID URINE DRUG SCREEN, HOSP PERFORMED
Amphetamines: NOT DETECTED
Barbiturates: NOT DETECTED
Benzodiazepines: NOT DETECTED
Cocaine: NOT DETECTED
Opiates: NOT DETECTED
Tetrahydrocannabinol: NOT DETECTED

## 2020-12-14 LAB — CBG MONITORING, ED: Glucose-Capillary: 80 mg/dL (ref 70–99)

## 2020-12-14 LAB — TROPONIN I (HIGH SENSITIVITY)
Troponin I (High Sensitivity): 5 ng/L (ref <18)
Troponin I (High Sensitivity): 6 ng/L (ref <18)

## 2020-12-14 LAB — D-DIMER, QUANTITATIVE: D-Dimer, Quant: 0.41 ug/mL-FEU (ref 0.00–0.50)

## 2020-12-14 LAB — SARS CORONAVIRUS 2 (TAT 6-24 HRS): SARS Coronavirus 2: NEGATIVE

## 2020-12-14 MED ORDER — ACETAMINOPHEN 325 MG PO TABS
650.0000 mg | ORAL_TABLET | Freq: Four times a day (QID) | ORAL | Status: DC | PRN
  Administered 2020-12-15: 650 mg via ORAL
  Administered 2020-12-16: 500 mg via ORAL
  Filled 2020-12-14: qty 2

## 2020-12-14 MED ORDER — SODIUM CHLORIDE 0.9% FLUSH
3.0000 mL | Freq: Two times a day (BID) | INTRAVENOUS | Status: DC
  Administered 2020-12-15 – 2020-12-20 (×11): 3 mL via INTRAVENOUS

## 2020-12-14 MED ORDER — IOHEXOL 350 MG/ML SOLN
75.0000 mL | Freq: Once | INTRAVENOUS | Status: AC | PRN
  Administered 2020-12-14: 75 mL via INTRAVENOUS

## 2020-12-14 MED ORDER — MORPHINE SULFATE (PF) 2 MG/ML IV SOLN
2.0000 mg | Freq: Once | INTRAVENOUS | Status: AC
  Administered 2020-12-15: 2 mg via INTRAVENOUS
  Filled 2020-12-14: qty 1

## 2020-12-14 MED ORDER — METHOCARBAMOL 500 MG PO TABS
500.0000 mg | ORAL_TABLET | Freq: Three times a day (TID) | ORAL | Status: DC | PRN
  Administered 2020-12-15 – 2020-12-19 (×2): 500 mg via ORAL
  Filled 2020-12-14 (×2): qty 1

## 2020-12-14 MED ORDER — LIDOCAINE VISCOUS HCL 2 % MT SOLN
15.0000 mL | Freq: Once | OROMUCOSAL | Status: AC
  Administered 2020-12-14: 15 mL via ORAL
  Filled 2020-12-14: qty 15

## 2020-12-14 MED ORDER — ALUM & MAG HYDROXIDE-SIMETH 200-200-20 MG/5ML PO SUSP
30.0000 mL | Freq: Once | ORAL | Status: AC
  Administered 2020-12-14: 30 mL via ORAL
  Filled 2020-12-14: qty 30

## 2020-12-14 MED ORDER — TRAMADOL HCL 50 MG PO TABS
50.0000 mg | ORAL_TABLET | Freq: Four times a day (QID) | ORAL | Status: DC | PRN
  Administered 2020-12-14 – 2020-12-20 (×13): 50 mg via ORAL
  Filled 2020-12-14 (×13): qty 1

## 2020-12-14 MED ORDER — SODIUM CHLORIDE 0.9 % IV SOLN: 250.0000 mL | INTRAVENOUS | Status: DC | PRN

## 2020-12-14 MED ORDER — ACETAMINOPHEN 650 MG RE SUPP: 650.0000 mg | Freq: Four times a day (QID) | RECTAL | Status: DC | PRN

## 2020-12-14 MED ORDER — GABAPENTIN 400 MG PO CAPS
400.0000 mg | ORAL_CAPSULE | Freq: Three times a day (TID) | ORAL | Status: DC
  Administered 2020-12-14 – 2020-12-20 (×18): 400 mg via ORAL
  Filled 2020-12-14 (×18): qty 1

## 2020-12-14 MED ORDER — SODIUM CHLORIDE 0.9% FLUSH
3.0000 mL | INTRAVENOUS | Status: DC | PRN
  Administered 2020-12-14: 3 mL via INTRAVENOUS

## 2020-12-14 MED ORDER — SODIUM CHLORIDE 0.9 % IV BOLUS
1000.0000 mL | Freq: Once | INTRAVENOUS | Status: AC
  Administered 2020-12-14: 1000 mL via INTRAVENOUS

## 2020-12-14 MED ORDER — DULOXETINE HCL 30 MG PO CPEP
30.0000 mg | ORAL_CAPSULE | Freq: Every day | ORAL | Status: DC
  Administered 2020-12-14 – 2020-12-20 (×7): 30 mg via ORAL
  Filled 2020-12-14 (×8): qty 1

## 2020-12-14 MED ORDER — ATORVASTATIN CALCIUM 10 MG PO TABS
20.0000 mg | ORAL_TABLET | Freq: Every day | ORAL | Status: DC
  Administered 2020-12-14 – 2020-12-20 (×7): 20 mg via ORAL
  Filled 2020-12-14 (×9): qty 2

## 2020-12-14 MED ORDER — ALPRAZOLAM 0.25 MG PO TABS
0.2500 mg | ORAL_TABLET | Freq: Every day | ORAL | Status: DC | PRN
  Administered 2020-12-14 – 2020-12-19 (×6): 0.25 mg via ORAL
  Filled 2020-12-14 (×7): qty 1

## 2020-12-14 MED ORDER — CARVEDILOL 6.25 MG PO TABS
6.2500 mg | ORAL_TABLET | Freq: Two times a day (BID) | ORAL | Status: DC
  Administered 2020-12-14 – 2020-12-16 (×4): 6.25 mg via ORAL
  Filled 2020-12-14: qty 2
  Filled 2020-12-14 (×4): qty 1
  Filled 2020-12-14: qty 2

## 2020-12-14 MED ORDER — PANTOPRAZOLE SODIUM 40 MG PO TBEC
80.0000 mg | DELAYED_RELEASE_TABLET | Freq: Every day | ORAL | Status: DC
  Administered 2020-12-14 – 2020-12-15 (×2): 80 mg via ORAL
  Filled 2020-12-14 (×3): qty 2

## 2020-12-14 MED ORDER — OMEPRAZOLE 40 MG PO CPDR: 40.0000 mg | DELAYED_RELEASE_CAPSULE | Freq: Every day | ORAL | 0 refills | Status: DC

## 2020-12-14 MED FILL — OMEPRAZOLE 40 MG CPDR: 40 | 30 days supply | Qty: 30 | Fill #0

## 2020-12-14 NOTE — Patient Instructions (Addendum)
A few things to remember from today's visit:   Chest pain, unspecified type - Plan: EKG 10-UVOZ, Basic metabolic panel, CBC, DG Chest 2 View  Essential hypertension - Plan: Basic metabolic panel  Anxiety disorder, unspecified type  If you need refills please call your pharmacy. Do not use My Chart to request refills or for acute issues that need immediate attention.   Continue soft diet for 7-10 days and advance as tolerated. Cut meat and chicken to small pieces and chew well, drinking water with meals may also help. Omeprazole 40 mg 30 min before breakfast recommended today. You can take Xanax if needed if anxiety gets worse because pain.  Please be sure medication list is accurate. If a new problem present, please set up appointment sooner than planned today.

## 2020-12-14 NOTE — H&P (Signed)
History and Physical:    [name]   YIR:[id number] DOB: 1961/07/02 DOA: 12/14/2020  Referring MD/provider: Gertie Fey. Rona Ravens, [place] PCP: [name], [name], MD   Patient coming from: Home  Chief Complaint: "I just do not feel right, I just do not feel right "  History of Present Illness:   [name] is an 60 y.o. female with PMH significant for HTN, chronic lower back pain status post spinal stimulator and anxiety/depression who was in her usual state of health until last week when she developed substernal chest pressure after eating.  Pain remained for the whole week without change with exertion.  Patient was afraid of eating so cut down on her p.o. intake.  At the same time she developed dizziness and lightheadedness and the sensation that "something is wrong".  She thinks that her dizziness may be described as a rocking or a room spinning but is not sure.  She also thinks that may be if she moves her head it is worse but she is not sure.  She notes that it she is dizzy or lightheaded all the time but is not sure what makes it worse.  She was seen by her PCP earlier today and was sent for chest x-ray.  At chest x-ray patient had an episode of syncope and was sent to the ED.    Review of systems is notable for a new tremor in her hands bilaterally over the past couple of days, she notes she has never had a tremor before.  She notes her right hand is much worse than her left and she cannot stop it from trembling.   ED Course:  The patient was noted to be afebrile and slightly hypertensive.  Laboratory data is unremarkable.  While checking orthostatics, patient was noted to become pale, diaphoretic and had an episode of presyncope.  She was seen by the PA at the time who noted that she was speaking with him coherently however had shaking of her right hand that she was unaware of.  She was apparently unable to stop it at the time.  There were no changes in her blood  pressure or her pulse during the orthostatics.  ROS:   ROS   Review of Systems: General: Denies fever, chills, malaise,  Respiratory: Denies cough, SOB at rest or hemoptysis Cardiovascular: Denies chest pain or palpitations GI: Denies nausea, vomiting, diarrhea or constipation GU: Denies dysuria, frequency or hematuria   Past Medical History:   Past Medical History:  Diagnosis Date  . Anxiety   . Back pain    related to spinal stenosis and disc problem, radiates down left buttocks to leg., weakness occ.  [name] Dyspnea   . Headache   . Hyperlipidemia   . Hypertension   . Lumbar post-laminectomy syndrome   . PONV (postoperative nausea and vomiting)   . Vaginal foreign object    "Uses Femring"    Past Surgical History:   Past Surgical History:  Procedure Laterality Date  . ABDOMINAL HYSTERECTOMY    . CARDIAC CATHETERIZATION N/A 04/18/2015   Procedure: Left Heart Cath and Coronary Angiography;  Surgeon: [name], MD;  Location: [place] [facility];  Service: Cardiovascular;  Laterality: N/A;  . COLONOSCOPY W/ BIOPSIES AND POLYPECTOMY    . FOOT SURGERY Bilateral    [place] "bunion,bone spur, tendon" (1) -6'16, (1)-10'16  . IR EPIDUROGRAPHY  07/21/2018  . LUMBAR LAMINECTOMY/DECOMPRESSION MICRODISCECTOMY Bilateral 12/28/2015   Procedure: MICRO LUMBAR DECOMPRESSION L4 -  L5 BILATERALLY;  Surgeon: [name], MD;  Location: [facility];  Service: Orthopedics;  Laterality: Bilateral;  . LUMBAR LAMINECTOMY/DECOMPRESSION MICRODISCECTOMY Bilateral 03/04/2018   Procedure: Revision of Microlumbar Decompression Bilateral Lumbar Four-Five;  Surgeon: [name], MD;  Location: [place];  Service: Orthopedics;  Laterality: Bilateral;  90 mins  . SPINAL CORD STIMULATOR INSERTION N/A 09/28/2019   Procedure: THORACIC SPINAL CORD STIMULATOR INSERTION;  Surgeon: Melina Schools, MD;  Location: [facility];  Service: Orthopedics;  Laterality: N/A;  2.5 hrs  . TUBAL LIGATION    . WISDOM TOOTH  EXTRACTION    . WOUND EXPLORATION N/A 03/04/2018   Procedure: EXPLORATION OF LUMBAR DECOMPRESSION WOUND;  Surgeon: [name], MD;  Location: [place];  Service: Orthopedics;  Laterality: N/A;    Social History:   Social History   Socioeconomic History  . Marital status: Married    Spouse name: Not on file  . Number of children: Not on file  . Years of education: Not on file  . Highest education level: Not on file  Occupational History  . Not on file  Tobacco Use  . Smoking status: Never Smoker  . Smokeless tobacco: Never Used  Vaping Use  . Vaping Use: Never used  Substance and Sexual Activity  . Alcohol use: No  . Drug use: No  . Sexual activity: Yes    Partners: Male    Birth control/protection: Post-menopausal  Other Topics Concern  . Not on file  Social History Narrative  . Not on file   Social Determinants of Health   Financial Resource Strain: Not on file  Food Insecurity: Not on file  Transportation Needs: Not on file  Physical Activity: Not on file  Stress: Not on file  Social Connections: Not on file  Intimate Partner Violence: Not on file    Allergies   Cephalosporins; Penicillins; Anesthetics, amide; Betadine [povidone iodine]; Peach [prunus persica]; Claritin [loratadine]; and Latex  Family history:   Family History  Problem Relation Age of Onset  . Heart attack Mother   . Lung cancer Father   . Cancer Father   . Pancreatic cancer Sister   . Breast cancer Sister 81  . Throat cancer Brother   . Multiple myeloma Sister   . Breast cancer Sister        diagnosed in her 63's  . Heart attack Sister   . Stomach cancer Cousin   . Colon cancer Neg Hx     Current Medications:   Prior to Admission medications   Medication Sig Start Date End Date Taking? Authorizing Provider  ALPRAZolam (XANAX) 0.25 MG tablet TAKE 1 TABLET BY MOUTH ONCE DAILY AS NEEDED FOR ANXIETY OR SLEEP Patient taking differently: Take 0.25 mg by mouth daily as needed for  anxiety or sleep. 12/13/19  Yes [name], [name], MD  atorvastatin (LIPITOR) 20 MG tablet Take 1 tablet (20 mg total) by mouth daily. 04/22/20  Yes [name], [name], MD  carvedilol (COREG) 6.25 MG tablet TAKE 1 TABLET BY MOUTH TWICE DAILY (NEED APPT) Patient taking differently: Take 6.25 mg by mouth 2 (two) times daily with a meal. 11/23/20  Yes [name], [name], MD  cholecalciferol (VITAMIN D3) 25 MCG (1000 UNIT) tablet Take 1,000 Units by mouth daily.   Yes [provider]  diphenhydrAMINE (BENADRYL) 25 MG tablet Take 25 mg by mouth daily as needed for allergies.   Yes [provider]  DULoxetine (CYMBALTA) 30 MG capsule Take 1 capsule (30 mg total) by mouth daily.  08/15/20  Yes Meredith Staggers, MD  gabapentin (NEURONTIN) 400 MG capsule Take 400 mg by mouth 3 (three) times daily. 07/09/20  Yes [provider]  methocarbamol (ROBAXIN) 500 MG tablet Take 500 mg by mouth every 8 (eight) hours as needed for muscle spasms.   Yes [provider]  omeprazole (PRILOSEC) 40 MG capsule Take 1 capsule (40 mg total) by mouth daily. 12/14/20  Yes [name], [name], MD  traMADol (ULTRAM) 50 MG tablet Take 50 mg by mouth every 6 (six) hours as needed for moderate pain. Dr. Nelva Bush prescribing   Yes [provider]  polyethylene glycol (MIRALAX / GLYCOLAX) packet Take 17 g by mouth daily. Patient not taking: No sig reported 03/20/18   Flora Lipps    Physical Exam:   Vitals:   12/14/20 1435 12/14/20 1445 12/14/20 1530 12/14/20 1545  BP:  (!) 161/90 (!) 138/58 (!) 154/86  Pulse:  72 74 79  Resp:  $Remo'15 14 17  'vMCSv$ Temp:      SpO2:  100% 100% 100%  Weight: 76.2 kg     Height: $Remove'5\' 5"'cJxedth$  (1.651 m)        Physical Exam: Blood pressure (!) 154/86, pulse 79, temperature 98.8 F (37.1 C), resp. rate 17, height $RemoveBe'5\' 5"'ZQCVoWOoo$  (1.651 m), weight 76.2 kg, SpO2 100 %. Gen: Somewhat anxious appearing female sitting up in stretcher with a coarse tremor of her right hand and a fine tremor of  her left hand. Eyes: sclera anicteric, conjuctiva mildly injected bilaterally CVS: S1-S2, regulary, no gallops Respiratory:  decreased air entry likely secondary to decreased inspiratory effort GI: NABS, soft, NT  LE: No edema. No cyanosis Neuro: A/O x 3, tremor as described earlier. Psych: mood and affect are somewhat anxious  skin: no rashes or lesions or ulcers,    Data Review:    Labs: Basic Metabolic Panel: Recent Labs  Lab 12/14/20 1010 12/14/20 1230  NA 142 139  K 4.5 3.9  CL 105 103  CO2 33* 28  GLUCOSE 90 96  BUN 12 9  CREATININE 1.07 0.99  CALCIUM 9.4 9.0   Liver Function Tests: No results for input(s): AST, ALT, ALKPHOS, BILITOT, PROT, ALBUMIN in the last 168 hours. No results for input(s): LIPASE, AMYLASE in the last 168 hours. No results for input(s): AMMONIA in the last 168 hours. CBC: Recent Labs  Lab 12/14/20 1010 12/14/20 1230  WBC 3.9* 4.3  HGB 12.3 12.6  HCT 37.4 39.2  MCV 90.1 91.6  PLT 264.0 284   Cardiac Enzymes: No results for input(s): CKTOTAL, CKMB, CKMBINDEX, TROPONINI in the last 168 hours.  BNP (last 3 results) No results for input(s): PROBNP in the last 8760 hours. CBG: Recent Labs  Lab 12/14/20 1352  GLUCAP 80    Urinalysis    Component Value Date/Time   COLORURINE YELLOW 09/23/2019 1207   APPEARANCEUR HAZY (A) 09/23/2019 1207   LABSPEC 1.023 09/23/2019 1207   PHURINE 5.0 09/23/2019 1207   GLUCOSEU NEGATIVE 09/23/2019 1207   HGBUR NEGATIVE 09/23/2019 1207   [place] 09/23/2019 1207   KETONESUR NEGATIVE 09/23/2019 1207   PROTEINUR NEGATIVE 09/23/2019 1207   UROBILINOGEN 0.2 04/24/2009 1600   NITRITE NEGATIVE 09/23/2019 1207   LEUKOCYTESUR NEGATIVE 09/23/2019 1207      Radiographic Studies: No results found.  EKG: Independently reviewed.  Sinus rhythm at 90.  Normal intervals.  Normal axis.  Normal EKG.  Poor baseline.   Assessment/Plan:   Principal Problem:   Postural dizziness  with  presyncope Active Problems:   Chest pain   Essential hypertension   Anxiety disorder, unspecified   Chronic pain   Lumbar post-laminectomy syndrome  60 year old female with odynophagia and decreased p.o. intake for 1 week presents with what sounds like vertigo and a new tremor that is unexplained.  Dizziness/syncope/presyncope Patient is not a great historian, difficult to understand exactly what her symptoms are. She seems to have some level of vertigo although it is unclear to me if this is peripheral or central. She also has this tremor which patient states is acute over the past week. Of note patient has had decreased p.o. intake in the last week due to this odynophagia, however there were no orthostatic changes by blood pressure and pulse although she was symptomatic during the procedure. She also received a liter of fluids and does not feel any different at all. I contacted neurology for a consult as I cannot rule out central vertigo in this hypertensive patient.  Odynophgia vs Dysphagia Patient states that she has had some chest pain for a whole week initiated with eating. Notes that she is only able to eat mashed potatoes for fear of pain. I have ordered an SLP evaluation, patient may benefit from a barium swallow. Patient may also benefit from an outpatient EGD. Continue outpatient PPI.  HTN Continue carvedilol   Anxiety and depression Continue duloxetine and Xanax per home doses  Chronic back pain secondary to spinal stenosis, with spinal stimulator in place Continue gabapentin, methocarbamol and tramadol per home doses      Other information:   DVT prophylaxis: None ordered as patient should go home tomorrow.  Patient has brace on her leg, no SCDs ordered. Code Status: Full Family Communication: Patient states she will speak with her husband. Disposition Plan: Home Consults called: Neurology Admission status: Observation  [name] [facility]  If 7PM-7AM, please contact night-coverage [email or web address] Password [facility] 12/14/2020, 5:01 PM

## 2020-12-14 NOTE — Progress Notes (Signed)
Chief Complaint  Patient presents with  . food won't digest   HPI: [name] is a 60 y.o. female, who is here today complaining of mid upper check pain that started 5 days ago. Gradual onset. Constant and "terrible." Last night it was worse, she thought about calling 911 but instead sat on her bed for a few hours and slightly better today.  When problem first started she was eating and felt like food got stuck in meddle of chest and has had pain since then. Pain is not exacerbated by exertion. No associated SOB or palpitations. Today she felt nauseated and lightheaded.  States that she knows something is wrong, she does not like the way she is feeling and very concerned about heart disease. States that her mother has similar pain 2 days before having a massive heart attack and her doctor did not believe her.  Strong FHX of CVD.  Pain cannot be explained. Pain is exacerbated by swallowing, worse with solid. Also chest discomfort with deep breathing.  Soft foods do not aggravate pain. She has not identified alleviating factors.  She has not noted heartburn. She has not tried OTC medication.  BP has been well controlled, 120's/70's. She has not noted headache, visual changes, sore throat, stridor, cough, wheezing, dyspnea, abdominal pain, vomiting, or changes in bowel habits.  Lab Results  Component Value Date   CREATININE 1.10 03/28/2020   BUN 13 03/28/2020   NA 140 03/28/2020   K 4.6 03/28/2020   CL 104 03/28/2020   CO2 32 03/28/2020   Hyperlipidemia on atorvastatin 20 mg daily. Anxiety: Currently she is on alprazolam 0.25 mg daily as needed. Negative for recent stressful event.  Chronic pain on tramadol 50 mg every 6 hours as needed and gabapentin 400 mg 3 times daily. No new medications.  Review of Systems  Constitutional: Positive for fatigue. Negative for activity change, appetite change and fever.  HENT: Negative for mouth sores and  nosebleeds.   Eyes: Negative for redness and visual disturbance.  Endocrine: Negative for cold intolerance and heat intolerance.  Genitourinary: Negative for decreased urine volume, dysuria and hematuria.  Musculoskeletal: Positive for arthralgias, back pain and gait problem.  Skin: Negative for pallor and rash.  Neurological: Negative for syncope, facial asymmetry and weakness.  Psychiatric/Behavioral: Positive for sleep disturbance. Negative for confusion. The patient is nervous/anxious.   Rest see pertinent positives and negatives per HPI.  No current facility-administered medications on file prior to visit.   Current Outpatient Medications on File Prior to Visit  Medication Sig Dispense Refill  . ALPRAZolam (XANAX) 0.25 MG tablet TAKE 1 TABLET BY MOUTH ONCE DAILY AS NEEDED FOR ANXIETY OR SLEEP 30 tablet 0  . atorvastatin (LIPITOR) 20 MG tablet Take 1 tablet (20 mg total) by mouth daily. 90 tablet 3  . carvedilol (COREG) 6.25 MG tablet TAKE 1 TABLET BY MOUTH TWICE DAILY (NEED APPT) 180 tablet 1  . diphenhydrAMINE (BENADRYL) 25 MG tablet Take 25 mg by mouth daily as needed for allergies.    . DULoxetine (CYMBALTA) 30 MG capsule Take 1 capsule (30 mg total) by mouth daily. 30 capsule 2  . gabapentin (NEURONTIN) 400 MG capsule Take 400 mg by mouth 3 (three) times daily.    . methocarbamol (ROBAXIN) 500 MG tablet methocarbamol 500 mg tablet  Take 1 tablet 3 times a day by oral route as needed for 5 days.    . ondansetron (ZOFRAN) 8 MG tablet Zofran 8 mg tablet  Take 1 tablet every 8 hours by oral route as needed for 10 days.    . polyethylene glycol (MIRALAX / GLYCOLAX) packet Take 17 g by mouth daily. (Patient taking differently: Take 17 g by mouth daily as needed for mild constipation.) 30 each 0  . traMADol (ULTRAM) 50 MG tablet Take by mouth every 6 (six) hours as needed. Dr. Nelva Bush prescribing     Past Medical History:  Diagnosis Date  . Anxiety   . Back pain    related to spinal  stenosis and disc problem, radiates down left buttocks to leg., weakness occ.  [name] Dyspnea   . Headache   . Hyperlipidemia   . Hypertension   . Lumbar post-laminectomy syndrome   . PONV (postoperative nausea and vomiting)   . Vaginal foreign object    "Uses Femring"   Allergies  Allergen Reactions  . Cephalosporins Anaphylaxis  . Penicillins Anaphylaxis and Hives    Did it involve swelling of the face/tongue/throat, SOB, or low BP? Yes Did it involve sudden or severe rash/hives, skin peeling, or any reaction on the inside of your mouth or nose? Yes Did you need to seek medical attention at a hospital or doctor's office? Yes When did it last happen?10+ years If all above answers are "NO", may proceed with cephalosporin use.    . Anesthetics, Amide Nausea And Vomiting    Does not know name of it. States they put it on record foot center.   . Betadine [Povidone Iodine] Other (See Comments)    "Skin Burn" caused scar on Left buttock  . Peach [Prunus Persica] Hives  . Claritin [Loratadine]     Rash, pruritis   . Latex Hives, Itching and Rash    Social History   Socioeconomic History  . Marital status: Married    Spouse name: Not on file  . Number of children: Not on file  . Years of education: Not on file  . Highest education level: Not on file  Occupational History  . Not on file  Tobacco Use  . Smoking status: Never Smoker  . Smokeless tobacco: Never Used  Vaping Use  . Vaping Use: Never used  Substance and Sexual Activity  . Alcohol use: No  . Drug use: No  . Sexual activity: Yes    Partners: Male    Birth control/protection: Post-menopausal  Other Topics Concern  . Not on file  Social History Narrative  . Not on file   Social Determinants of Health   Financial Resource Strain: Not on file  Food Insecurity: Not on file  Transportation Needs: Not on file  Physical Activity: Not on file  Stress: Not on file  Social Connections: Not on file   Vitals:    12/14/20 0920  BP: 126/80  Pulse: 81  Resp: 16  SpO2: 97%   Body mass index is 29.45 kg/m.  Physical Exam Vitals and nursing note reviewed.  Constitutional:      General: She is not in acute distress.    Appearance: She is well-developed.  HENT:     Head: Normocephalic and atraumatic.     Mouth/Throat:     Mouth: Oropharynx is clear and moist and mucous membranes are normal. Mucous membranes are moist.  Eyes:     Conjunctiva/sclera: Conjunctivae normal.  Cardiovascular:     Rate and Rhythm: Normal rate and regular rhythm.     Heart sounds: No murmur heard.     Comments: DP pulses present,bilateral.  Pulmonary:  Effort: Pulmonary effort is normal. No respiratory distress.     Breath sounds: Normal breath sounds.  Chest:  Breasts:     Right: No supraclavicular adenopathy.     Left: No supraclavicular adenopathy.        Comments: Pain with palpation of anterior chest wall and left lateral. Abdominal:     Palpations: Abdomen is soft.     Tenderness: There is no abdominal tenderness.  Musculoskeletal:        General: No edema.     Right shoulder: Normal range of motion.     Left shoulder: Normal range of motion.     Cervical back: No tenderness or bony tenderness.     Comments: Cervical ROM with no significant limitations. Left shoulder movement elicits left-sided chest discomfort.  Lymphadenopathy:     Cervical: No cervical adenopathy.     Upper Body:     Right upper body: No supraclavicular adenopathy.     Left upper body: No supraclavicular adenopathy.  Skin:    General: Skin is warm.     Findings: No erythema or rash.  Neurological:     General: No focal deficit present.     Mental Status: She is alert and oriented to person, place, and time.     Cranial Nerves: No cranial nerve deficit.     Gait: Gait normal.     Deep Tendon Reflexes: Strength normal.     Reflex Scores:      Bicep reflexes are 2+ on the right side and 2+ on the left side.     Comments: Unstable gait, assisted with a walker.  Psychiatric:        Mood and Affect: Mood is anxious. Affect is labile.     Comments: Well groomed, good eye contact.    ASSESSMENT AND PLAN:  Ms.Carra was seen today for food won't digest.  Diagnoses and all orders for this visit: Orders Placed This Encounter  Procedures  . DG Chest 2 View  . Basic metabolic panel  . CBC  . EKG 12-Lead    Chest pain, unspecified type Explained that hx and examination today do not suggest a serious process. EKG today NSR,normal axis and intervals. No significant changes when compared with EKG done on 09/23/2019. Even thought the likelihood of this being cardia is low it is never 0.  Musculoskeletal pain can be aggravating problem.  She is very anxious.Offered to send her to the ER today but she prefers to wait for now. Clearly instructed about warning signs.  Essential hypertension BP adequately controlled. Continue carvedilol 6.25 mg twice daily. Low-salt diet. Continue monitoring BP at home.  Anxiety disorder, unspecified type She is taking alprazolam 0.25 mg daily as needed. I recommended taking medication when feeling anxious about CP and have her husband monitoring for MS changes. We may want to consider adding a SSRI in the future.  Dysphagia, unspecified type We discussed possible etiologies. ? Esophagitis. She agrees with trial of PPI. GERD precaution recommended. For 7 to 10 days continue soft diet and advance as tolerated. Instructed about warning signs. She will let me know next week if she has noted benefit with medication, before if problem gets worse. If problem is persistent, GI evaluation may be necessary.  -     omeprazole (PRILOSEC) 40 MG capsule; Take 1 capsule (40 mg total) by mouth daily.  Spent 43 minutes.  During this time history was obtained and documented, examination was performed, prior labs reviewed, and assessment/plan discussed.  Return if symptoms  worsen or fail to improve, for Keep next appt.  Around 10:15 Am I was called into X ray room because Ms Pua collapsed and seizure like episode. Unresponsive for a couple minutes. Pulse and breathing present and pupils reactive. HR 97/min, O2 sat 91-94% at RA. Another episode of generalized seizure like sctivity for 1 min, no urine or blowel incontinence and no tongue bite. Right after event she is able to answer questions, denies having any pain, follows simple commands (able to move RUE,LUE,LLE,and RLE whan asked to do so) and oriented in self,place,person,and time.   Reluctant to go to the ER, she requested calling her husband to drive her home. Explained that she must be evaluated in the ER, she is going to need head imaging and blood work stat. Finally she agrees with going to the ER. EMS on its way. O2 supplemental O2 2 LMP per small mask, we did not have [place]. We do not have Ativan or valium available in the office to treat possible seizure disorder.  Moncerrath Berhe G. [name], MD  [facility]. [place] office.   A few things to remember from today's visit:   Chest pain, unspecified type - Plan: EKG 75-PYYF, Basic metabolic panel, CBC, DG Chest 2 View  Essential hypertension - Plan: Basic metabolic panel  Anxiety disorder, unspecified type  If you need refills please call your pharmacy. Do not use My Chart to request refills or for acute issues that need immediate attention.   Continue soft diet for 7-10 days and advance as tolerated. Cut meat and chicken to small pieces and chew well, drinking water with meals may also help. Omeprazole 40 mg 30 min before breakfast recommended today. You can take Xanax if needed if anxiety gets worse because pain.  Please be sure medication list is accurate. If a new problem present, please set up appointment sooner than planned today.

## 2020-12-14 NOTE — Consult Note (Signed)
Neurology Consultation  Reason for Consult: dizziness, syncope Referring Physician: Hollice Espy, MD  CC: I passed out at my doctor's office today  History is obtained from: patient, chart  HPI: [name] is a 60 y.o. female with a PMHx of HTN, back pain, spinal stenosis with stimulator implant, right foot drop, HLD, anxiety, and depression. Patient states she has had chest pain x about 1 week which felt like food was stuck. CP continued without dyspepsia and she did not seek treatment. 2 nights ago her pain woke her up and she called her PCP and made an appointment for today. Patient states that she has worried a lot about this chest pain because her mother died of a heart attack. She told her PCP today that she just didn't feel right and she c/o dizziness, defined as room spinning. PCP needed labs and xray. Patient had labs drawn. While in Xray, she felt like she was going to pass out and did. She was dizzy prior to incident but denied any flashing lights or smells. She is normally not squeamish nor gets dizzy with lab draws. Per note from PCP, there is question as to witnessed seizure activity, but patient was not post ictal.   Patient has chronic back issues and has a spinal stimulator in place and can not have MRI. She has right foot drop from spine issues and wears an AFO. She also has a walker she uses with ambulation. She has not been working out strenuously. She did go to rehab this past Monday.    Patient has no seizure hx, no hx of brain tumor, stroke, or brain surgery. Today, she states she has had a new tremor in her hands for past few days. She has not had any acute illness, fever, chills, cough, n/v, or dysuria at home.   In the ED, she was noted to become pale and diaphoretic with orthostatic vitals without change in the vital signs.   ROS: A 14 point ROS was performed and is negative except as noted in the HPI.    Past Medical History:  Diagnosis Date  . Anxiety    . Back pain    related to spinal stenosis and disc problem, radiates down left buttocks to leg., weakness occ.  [name] Dyspnea   . Headache   . Hyperlipidemia   . Hypertension   . Lumbar post-laminectomy syndrome   . PONV (postoperative nausea and vomiting)   . Vaginal foreign object    "Uses Femring"  depression  Family History  Problem Relation Age of Onset  . Heart attack Mother   . Lung cancer Father   . Cancer Father   . Pancreatic cancer Sister   . Breast cancer Sister 5  . Throat cancer Brother   . Multiple myeloma Sister   . Breast cancer Sister        diagnosed in her 75's  . Heart attack Sister   . Stomach cancer Cousin   . Colon cancer Neg Hx    Social History:   reports that she has never smoked. She has never used smokeless tobacco. She reports that she does not drink alcohol and does not use drugs.  Medications  Current Facility-Administered Medications:  .  0.9 %  sodium chloride infusion, 250 mL, Intravenous, PRN, Bonnell Public Tublu, MD .  acetaminophen (TYLENOL) tablet 650 mg, 650 mg, Oral, Q6H PRN **OR** acetaminophen (TYLENOL) suppository 650 mg, 650 mg, Rectal, Q6H PRN, Jamse Arn, Kyra Searles, MD .  ALPRAZolam Duanne Moron)  tablet 0.25 mg, 0.25 mg, Oral, Daily PRN, Bonnell Public Tublu, MD .  atorvastatin (LIPITOR) tablet 20 mg, 20 mg, Oral, Daily, Jamse Arn, Dewaine Oats Tublu, MD .  carvedilol (COREG) tablet 6.25 mg, 6.25 mg, Oral, BID WC, Chatterjee, Dewaine Oats Tublu, MD .  DULoxetine (CYMBALTA) DR capsule 30 mg, 30 mg, Oral, Daily, Jamse Arn, Dewaine Oats Tublu, MD .  gabapentin (NEURONTIN) capsule 400 mg, 400 mg, Oral, TID, Jamse Arn, Dewaine Oats Tublu, MD .  methocarbamol (ROBAXIN) tablet 500 mg, 500 mg, Oral, Q8H PRN, Jamse Arn, Dewaine Oats Tublu, MD .  pantoprazole (PROTONIX) EC tablet 80 mg, 80 mg, Oral, Daily, Chatterjee, Dewaine Oats Tublu, MD .  sodium chloride flush (NS) 0.9 % injection 3 mL, 3 mL, Intravenous, Q12H, Chatterjee, Srobona Tublu, MD .  sodium  chloride flush (NS) 0.9 % injection 3 mL, 3 mL, Intravenous, PRN, Bonnell Public Tublu, MD .  traMADol (ULTRAM) tablet 50 mg, 50 mg, Oral, Q6H PRN, Jamse Arn Kyra Searles, MD  Current Outpatient Medications:  .  ALPRAZolam (XANAX) 0.25 MG tablet, TAKE 1 TABLET BY MOUTH ONCE DAILY AS NEEDED FOR ANXIETY OR SLEEP (Patient taking differently: Take 0.25 mg by mouth daily as needed for anxiety or sleep.), Disp: 30 tablet, Rfl: 0 .  atorvastatin (LIPITOR) 20 MG tablet, Take 1 tablet (20 mg total) by mouth daily., Disp: 90 tablet, Rfl: 3 .  carvedilol (COREG) 6.25 MG tablet, TAKE 1 TABLET BY MOUTH TWICE DAILY (NEED APPT) (Patient taking differently: Take 6.25 mg by mouth 2 (two) times daily with a meal.), Disp: 180 tablet, Rfl: 1 .  cholecalciferol (VITAMIN D3) 25 MCG (1000 UNIT) tablet, Take 1,000 Units by mouth daily., Disp: , Rfl:  .  diphenhydrAMINE (BENADRYL) 25 MG tablet, Take 25 mg by mouth daily as needed for allergies., Disp: , Rfl:  .  DULoxetine (CYMBALTA) 30 MG capsule, Take 1 capsule (30 mg total) by mouth daily., Disp: 30 capsule, Rfl: 2 .  gabapentin (NEURONTIN) 400 MG capsule, Take 400 mg by mouth 3 (three) times daily., Disp: , Rfl:  .  methocarbamol (ROBAXIN) 500 MG tablet, Take 500 mg by mouth every 8 (eight) hours as needed for muscle spasms., Disp: , Rfl:  .  omeprazole (PRILOSEC) 40 MG capsule, Take 1 capsule (40 mg total) by mouth daily., Disp: 30 capsule, Rfl: 0 .  traMADol (ULTRAM) 50 MG tablet, Take 50 mg by mouth every 6 (six) hours as needed for moderate pain. Dr. Nelva Bush prescribing, Disp: , Rfl:  .  polyethylene glycol (MIRALAX / GLYCOLAX) packet, Take 17 g by mouth daily. (Patient not taking: No sig reported), Disp: 30 each, Rfl: 0  Exam: Current vital signs: BP (!) 154/86   Pulse 79   Temp 98.8 F (37.1 C)   Resp 17   Ht $R'5\' 5"'Pf$  (1.651 m)   Wt 76.2 kg   SpO2 100%   BMI 27.96 kg/m  Vital signs in last 24 hours: Temp:  [98.8 F (37.1 C)] 98.8 F (37.1 C)  (02/18 1203) Pulse Rate:  [69-94] 79 (02/18 1545) Resp:  [12-25] 17 (02/18 1545) BP: (126-182)/(58-141) 154/86 (02/18 1545) SpO2:  [97 %-100 %] 100 % (02/18 1545) Weight:  [76.2 kg] 76.2 kg (02/18 1435)  GENERAL: Awake, alert in NAD HEENT: - Normocephalic and atraumatic, dry mm, no LN++, no Thyromegaly LUNGS - Normal respiratory effort. SaO2 CV - RRR on tele ABDOMEN - Soft, nontender Ext: warm, well perfused Psych: Affect is light.   NEURO:  Mental Status: AA&Ox3.  Speech/Language: speech is without dysarthria. Naming, repetition, fluency, and  comprehension intact. Cranial Nerves:  II: PERRL. visual fields full.  III, IV, VI: EOMI. Lid elevation symmetric and full.  V: sensation is intact and symmetrical to face. Moves jaw back and forth.  VII: Smile is symmetrical. Able to puff cheeks and raise eyebrows.  VIII:hearing intact to voice IX, X: palate elevation is symmetric. Phonation normal.  XI: normal sternocleidomastoid and trapezius muscle strength DUK:RCVKFM is symmetrical without fasciculations.   Motor: 5/5 strength is all muscle groups except RLE dorsiflexion and plantar flexion which is 3+/5.  Tone is normal. Bulk is normal.  Sensation- Intact to light touch bilaterally in all four extremities. No extinction to DSS.   Coordination: FTN intact bilaterally. HKS not performed due to leg pain and rt foot drop. No pronator drift.  DTRs: 2+ UEs and left patella.  0 right patella. Gait- deferred  Labs I have reviewed labs in epic and the results pertinent to this consultation are: Na 139      K 3.9    Glucose 96    Creat .99  CBC    Component Value Date/Time   WBC 4.3 12/14/2020 1230   RBC 4.28 12/14/2020 1230   HGB 12.6 12/14/2020 1230   HCT 39.2 12/14/2020 1230   PLT 284 12/14/2020 1230   MCV 91.6 12/14/2020 1230   MCH 29.4 12/14/2020 1230   MCHC 32.1 12/14/2020 1230   RDW 13.3 12/14/2020 1230   LYMPHSABS 1.8 08/02/2018 0721   MONOABS 0.4 08/02/2018 0721   EOSABS  0.0 08/02/2018 0721   BASOSABS 0.0 08/02/2018 0721    CMP     Component Value Date/Time   NA 139 12/14/2020 1230   K 3.9 12/14/2020 1230   CL 103 12/14/2020 1230   CO2 28 12/14/2020 1230   GLUCOSE 96 12/14/2020 1230   BUN 9 12/14/2020 1230   CREATININE 0.99 12/14/2020 1230   CREATININE 0.99 04/01/2019 1535   CALCIUM 9.0 12/14/2020 1230   PROT 6.6 04/20/2020 0955   ALBUMIN 4.1 04/20/2020 0955   AST 16 04/20/2020 0955   ALT 19 04/20/2020 0955   ALKPHOS 70 04/20/2020 0955   BILITOT 0.4 04/20/2020 0955   GFRNONAA >60 12/14/2020 1230   GFRAA >60 09/23/2019 1207    Lipid Panel     Component Value Date/Time   CHOL 198 04/20/2020 0955   TRIG 62.0 04/20/2020 0955   HDL 61.70 04/20/2020 0955   CHOLHDL 3 04/20/2020 0955   VLDL 12.4 04/20/2020 0955   LDLCALC 123 (H) 04/20/2020 0955     Imaging None as of time of exam.   Assessment: 60 yo female who relates CP x one week with feeling like her food is stuck. Today, while being seen at PCP, she had syncope episode in xray. Bystanders thought that maybe she had a seizure, but after event was alert and oriented. No urinary/fecal incontinence, no tongue biting.   Impression: We think her dizziness is peripheral and not central. However, due to the dizziness and not being able to have MRI brain, we will rule out any arterial issues/vertibular/basilar with CTA head and neck. I do not suspect she had true seizure episode at PCP, but we will check EEG as seizure could contribute to her symptoms.   Recommendations: -CTA head and neck-ordered -EEG, routine- ordered. -check TSH, UDS, and closely monitor glucose for hypoglycemia.  -will follow as results are available.   Pt seen by Jimmye Norman, NP/Neuro and later by MD. Note/plan to be edited by MD as needed.  Pager:  [phone]   Attestation:  I saw this patient with the APP on 12/14/20, obtained pertinent aspects of the history, and performed relevant physical and neurological  examination as documented. Also, I reviewed the available laboratory data and neuroimages, and other relevant tests/notes/procedures.  Describes a variety of symptoms as outlined, chest pain, dizziness, choking sensation, diplopia, syncope, vertigo, and "foot drop," among others. She walks with a rolling walker at baseline. She wears an AFO, and has a spine stimulator (so cannot have MRI).  My examination findings include no nystagmus, skew, dysarthria, or other cranial nerve deficit. Ate her meal (tray by bedside). Strength good in UE, but poor effort on all aspects of right leg testing.   Impression: Possible vertebrobasilar dizziness/vertigo/diplopia/dysphagia and syncope. Many symptoms that are difficult to tie together into a coherent unifying disorder otherwise.  Recommendations: CTA head and neck Will also get routine EEG to assess for seizure as a cause for the syncope, though this is unlikely to have anything to do with her other symptoms as listed.  Will follow along.  Thank you.  Perfecto Kingdom, MD

## 2020-12-14 NOTE — ED Triage Notes (Signed)
Pt arrives to ED via [facility] from pcp office where she was being seen for substernal chest pain for the last one week, she was having an xray done and she began feeling lightheaded and then she woke up on the floor. Reported by ems for possible second syncope event.  20G left a/c

## 2020-12-14 NOTE — ED Provider Notes (Signed)
[facility] EMERGENCY DEPARTMENT Provider Note   CSN: [id number] Arrival date & time: 12/14/20  1157     History Chief Complaint  Patient presents with  . Chest Pain    [name] is a 60 y.o. female.  The history is provided by the patient and medical records. No language interpreter was used.  Chest Pain    60 year old female significant history of hypertension, depression, hyperlipidemia, spinal stenosis brought here via EMS from PCP office for evaluation of chest pain and syncope.  Patient report for the past 4 to 5 days she have not been feeling well.  She endorsed having some postprandial chest pain, report more as a dysphagia, decrease in appetite, haven't been eating drinking much in fear of having pain, and have been having recurrent episodes of lightheadedness.  She reports she feels like she was going to pass out a few times.  She also endorsed this chest discomfort is towards her mid chest sometimes spreads to the left side of her chest and arm.  She feels as if her food may get stuck in the back of her throat when she eats.  She did not complain of any vomiting.  No associated fever no runny nose sneezing coughing or sore throat.  She has been fully vaccinated for COVID-19.  She denies any dysuria.  She does have strong family history of cardiac disease.  She denies any alcohol tobacco use or drug use.  She was seen by her PCP office today for her symptoms and while going to get a chest x-ray she had a witnessed syncopal episode.  Denies any injury but did report feeling lightheadedness prior to passing out.  She was brought here for further evaluation.  No report of shortness of breath.  No prior history of PE or DVT.  Past Medical History:  Diagnosis Date  . Anxiety   . Back pain    related to spinal stenosis and disc problem, radiates down left buttocks to leg., weakness occ.  [name] Dyspnea   . Headache   . Hyperlipidemia   . Hypertension   .  Lumbar post-laminectomy syndrome   . PONV (postoperative nausea and vomiting)   . Vaginal foreign object    "Uses Femring"    Patient Active Problem List   Diagnosis Date Noted  . Chronic pain 09/28/2019  . Lumbar spinal stenosis   . Radicular pain   . Gait disorder   . Difficulty in urination 07/13/2018  . Back pain 07/12/2018  . Hyperlipidemia 04/16/2018  . Anxiety disorder, unspecified 04/16/2018  . AKI (acute kidney injury) ([place])   . Benign essential HTN   . Major depression, recurrent ([place])   . Lumbar radiculopathy 03/09/2018  . Paraparesis ([place]) 03/09/2018  . Essential hypertension   . Chronic nonintractable headache   . Leukocytosis   . Acute blood loss anemia   . Postoperative pain   . Generalized weakness   . Spinal stenosis at L4-L5 level 03/04/2018  . Spinal stenosis of lumbar region 12/28/2015  . Chest pain 04/16/2015    Past Surgical History:  Procedure Laterality Date  . ABDOMINAL HYSTERECTOMY    . CARDIAC CATHETERIZATION N/A 04/18/2015   Procedure: Left Heart Cath and Coronary Angiography;  Surgeon: [name], MD;  Location: [facility];  Service: Cardiovascular;  Laterality: N/A;  . COLONOSCOPY W/ BIOPSIES AND POLYPECTOMY    . FOOT SURGERY Bilateral    [place] "bunion,bone spur, tendon" (1) -6'16, (1)-10'16  .  IR EPIDUROGRAPHY  07/21/2018  . LUMBAR LAMINECTOMY/DECOMPRESSION MICRODISCECTOMY Bilateral 12/28/2015   Procedure: MICRO LUMBAR DECOMPRESSION L4 - L5 BILATERALLY;  Surgeon: [name], MD;  Location: [facility];  Service: Orthopedics;  Laterality: Bilateral;  . LUMBAR LAMINECTOMY/DECOMPRESSION MICRODISCECTOMY Bilateral 03/04/2018   Procedure: Revision of Microlumbar Decompression Bilateral Lumbar Four-Five;  Surgeon: [name], MD;  [facility];  Service: Orthopedics;  Laterality: Bilateral;  90 mins  . SPINAL CORD STIMULATOR INSERTION N/A 09/28/2019   Procedure: THORACIC SPINAL CORD STIMULATOR INSERTION;  Surgeon: Melina Schools, MD;  Location: [facility];  Service: Orthopedics;  Laterality: N/A;  2.5 hrs  . TUBAL LIGATION    . WISDOM TOOTH EXTRACTION    . WOUND EXPLORATION N/A 03/04/2018   Procedure: EXPLORATION OF LUMBAR DECOMPRESSION WOUND;  Surgeon: [name], MD;  Location: [facility];  Service: Orthopedics;  Laterality: N/A;     OB History   No obstetric history on file.     Family History  Problem Relation Age of Onset  . Heart attack Mother   . Lung cancer Father   . Cancer Father   . Pancreatic cancer Sister   . Breast cancer Sister 47  . Throat cancer Brother   . Multiple myeloma Sister   . Breast cancer Sister        diagnosed in her 72's  . Heart attack Sister   . Stomach cancer Cousin   . Colon cancer Neg Hx     Social History   Tobacco Use  . Smoking status: Never Smoker  . Smokeless tobacco: Never Used  Vaping Use  . Vaping Use: Never used  Substance Use Topics  . Alcohol use: No  . Drug use: No    Home Medications Prior to Admission medications   Medication Sig Start Date End Date Taking? Authorizing Provider  ALPRAZolam (XANAX) 0.25 MG tablet TAKE 1 TABLET BY MOUTH ONCE DAILY AS NEEDED FOR ANXIETY OR SLEEP 12/13/19   [name], [name], MD  atorvastatin (LIPITOR) 20 MG tablet Take 1 tablet (20 mg total) by mouth daily. 04/22/20   [name], [name], MD  carvedilol (COREG) 6.25 MG tablet TAKE 1 TABLET BY MOUTH TWICE DAILY (NEED APPT) 11/23/20   [name], [name], MD  diphenhydrAMINE (BENADRYL) 25 MG tablet Take 25 mg by mouth daily as needed for allergies.    [provider]  DULoxetine (CYMBALTA) 30 MG capsule Take 1 capsule (30 mg total) by mouth daily. 08/15/20   Meredith Staggers, MD  gabapentin (NEURONTIN) 400 MG capsule Take 400 mg by mouth 3 (three) times daily. 07/09/20   [provider]  methocarbamol (ROBAXIN) 500 MG tablet methocarbamol 500 mg tablet  Take 1 tablet 3 times a day by oral route as needed for 5 days.    [provider]  omeprazole  (PRILOSEC) 40 MG capsule Take 1 capsule (40 mg total) by mouth daily. 12/14/20   [name], [name], MD  ondansetron (ZOFRAN) 8 MG tablet Zofran 8 mg tablet  Take 1 tablet every 8 hours by oral route as needed for 10 days.    [provider]  polyethylene glycol (MIRALAX / GLYCOLAX) packet Take 17 g by mouth daily. Patient taking differently: Take 17 g by mouth daily as needed for mild constipation. 03/20/18   Love, Ivan Anchors, PA-C  traMADol (ULTRAM) 50 MG tablet Take by mouth every 6 (six) hours as needed. Dr. Nelva Bush prescribing    [provider]    Allergies    Cephalosporins;  Penicillins; Anesthetics, amide; Betadine [povidone iodine]; Peach [prunus persica]; Claritin [loratadine]; and Latex  Review of Systems   Review of Systems  Cardiovascular: Positive for chest pain.  All other systems reviewed and are negative.   Physical Exam Updated Vital Signs BP (!) 166/97 (BP Location: Right Arm)   Pulse 79   Temp 98.8 F (37.1 C)   Resp 16   SpO2 100%   Physical Exam Vitals and nursing note reviewed.  Constitutional:      General: She is not in acute distress.    Appearance: She is well-developed and well-nourished. She is obese.  HENT:     Head: Atraumatic.  Eyes:     Conjunctiva/sclera: Conjunctivae normal.  Cardiovascular:     Rate and Rhythm: Normal rate and regular rhythm.     Pulses: Normal pulses.     Heart sounds: Normal heart sounds.  Pulmonary:     Effort: Pulmonary effort is normal.     Breath sounds: Normal breath sounds. No wheezing or rales.  Abdominal:     Palpations: Abdomen is soft.     Tenderness: There is no abdominal tenderness.  Musculoskeletal:     Cervical back: Neck supple.     Right lower leg: No edema.     Left lower leg: No edema.  Skin:    Findings: No rash.  Neurological:     Mental Status: She is alert and oriented to person, place, and time.  Psychiatric:        Mood and Affect: Mood and affect and mood normal.     ED  Results / Procedures / Treatments   Labs (all labs ordered are listed, but only abnormal results are displayed) Labs Reviewed  SARS CORONAVIRUS 2 (TAT 6-24 HRS)  BASIC METABOLIC PANEL  CBC  D-DIMER, QUANTITATIVE  CBG MONITORING, ED  TROPONIN I (HIGH SENSITIVITY)  TROPONIN I (HIGH SENSITIVITY)    EKG EKG Interpretation  Date/Time:  Friday December 14 2020 12:05:48 EST Ventricular Rate:  92 PR Interval:  198 QRS Duration: 76 QT Interval:  378 QTC Calculation: 467 R Axis:   64 Text Interpretation: Normal sinus rhythm Similar to prior. Confirmed by Nanda Quinton [phone]) on 12/14/2020 1:23:33 PM   Radiology No results found.  Procedures Procedures   Medications Ordered in ED Medications  alum & mag hydroxide-simeth (MAALOX/MYLANTA) 200-200-20 MG/5ML suspension 30 mL (has no administration in time range)    And  lidocaine (XYLOCAINE) 2 % viscous mouth solution 15 mL (has no administration in time range)  sodium chloride 0.9 % bolus 1,000 mL (1,000 mLs Intravenous New Bag/Given 12/14/20 1422)    ED Course  I have reviewed the triage vital signs and the nursing notes.  Pertinent labs & imaging results that were available during my care of the patient were reviewed by me and considered in my medical decision making (see chart for details).    MDM Rules/Calculators/A&P                          BP (!) 147/76   Pulse 69   Temp 98.8 F (37.1 C)   Resp 12   SpO2 100%   Final Clinical Impression(s) / ED Diagnoses Final diagnoses:  Syncope, unspecified syncope type    Rx / DC Orders ED Discharge Orders    None     1:47 PM Patient with recurrent midsternal chest discomfort, reported feeling lightheadedness and had a syncopal episode today while she was at  her PCP office for work-up of her chest pain.  At this time she is mentating appropriately no signs ofInjury from her syncopal episode.  Her EKG without any concerning finding no concerning arrhythmia.  Initial  troponin is unremarkable, labs are reassuring, no electrolyte derangement and no evidence of anemia.  Symptoms not consistent with ACS.  Low suspicion for PE as patient does not endorse any shortness of breath  We will attempt to perform orthostatic vital sign, patient became very shaky, endorse pain in her chest as well as pain to her back.  She does have history of chronic back pain with a spinal nerve stimulator and she also had a chronic right foot drop requiring a leg brace.  Her orthopedist is Dr. Maxie Better.  She does have a follow-up appointment next week.  I suspect her symptoms may be due to some autonomic dysfunction causing her tremors and lightheadedness.  Doubt seizure.  HEART score of 4, which puts her into the mild-moderate risk of MACE.    Normal orthostatic vital sign although pt was symptomatic.  I discussed care with Dr. Laverta Baltimore, we felt given recurrent episodes of near syncope/syncope it may be worthwhile to have patient admitted for observation along with having echocardiogram.  No signs of head injury warranting head imaging at this time.  4:07 PM Appreciate consultation from [facility] Dr. Jamse Arn who agrees to see and admit pt for further work up of near syncope/syncope.  Pt agrees with plan.  covid screening test ordered.     Domenic Moras, PA-C 12/14/20 1609    Long, Wonda Olds, MD 12/15/20 [phone]

## 2020-12-15 ENCOUNTER — Observation Stay (HOSPITAL_COMMUNITY): Payer: PPO

## 2020-12-15 ENCOUNTER — Observation Stay (HOSPITAL_BASED_OUTPATIENT_CLINIC_OR_DEPARTMENT_OTHER): Payer: PPO

## 2020-12-15 DIAGNOSIS — H8110 Benign paroxysmal vertigo, unspecified ear: Secondary | ICD-10-CM | POA: Diagnosis not present

## 2020-12-15 DIAGNOSIS — G8929 Other chronic pain: Secondary | ICD-10-CM | POA: Diagnosis not present

## 2020-12-15 DIAGNOSIS — I517 Cardiomegaly: Secondary | ICD-10-CM

## 2020-12-15 DIAGNOSIS — Z9682 Presence of neurostimulator: Secondary | ICD-10-CM | POA: Diagnosis not present

## 2020-12-15 DIAGNOSIS — R55 Syncope and collapse: Secondary | ICD-10-CM

## 2020-12-15 DIAGNOSIS — Z807 Family history of other malignant neoplasms of lymphoid, hematopoietic and related tissues: Secondary | ICD-10-CM | POA: Diagnosis not present

## 2020-12-15 DIAGNOSIS — M961 Postlaminectomy syndrome, not elsewhere classified: Secondary | ICD-10-CM | POA: Diagnosis not present

## 2020-12-15 DIAGNOSIS — Z808 Family history of malignant neoplasm of other organs or systems: Secondary | ICD-10-CM | POA: Diagnosis not present

## 2020-12-15 DIAGNOSIS — M4800 Spinal stenosis, site unspecified: Secondary | ICD-10-CM | POA: Diagnosis present

## 2020-12-15 DIAGNOSIS — R079 Chest pain, unspecified: Secondary | ICD-10-CM | POA: Diagnosis present

## 2020-12-15 DIAGNOSIS — M21371 Foot drop, right foot: Secondary | ICD-10-CM | POA: Diagnosis not present

## 2020-12-15 DIAGNOSIS — Z800 Family history of malignant neoplasm of digestive organs: Secondary | ICD-10-CM | POA: Diagnosis not present

## 2020-12-15 DIAGNOSIS — R1319 Other dysphagia: Secondary | ICD-10-CM

## 2020-12-15 DIAGNOSIS — I361 Nonrheumatic tricuspid (valve) insufficiency: Secondary | ICD-10-CM

## 2020-12-15 DIAGNOSIS — R42 Dizziness and giddiness: Secondary | ICD-10-CM | POA: Diagnosis not present

## 2020-12-15 DIAGNOSIS — Z881 Allergy status to other antibiotic agents status: Secondary | ICD-10-CM | POA: Diagnosis not present

## 2020-12-15 DIAGNOSIS — R1310 Dysphagia, unspecified: Secondary | ICD-10-CM | POA: Diagnosis not present

## 2020-12-15 DIAGNOSIS — R251 Tremor, unspecified: Secondary | ICD-10-CM | POA: Diagnosis present

## 2020-12-15 DIAGNOSIS — Z880 Allergy status to penicillin: Secondary | ICD-10-CM | POA: Diagnosis not present

## 2020-12-15 DIAGNOSIS — E785 Hyperlipidemia, unspecified: Secondary | ICD-10-CM | POA: Diagnosis not present

## 2020-12-15 DIAGNOSIS — Z20822 Contact with and (suspected) exposure to covid-19: Secondary | ICD-10-CM | POA: Diagnosis not present

## 2020-12-15 DIAGNOSIS — Z801 Family history of malignant neoplasm of trachea, bronchus and lung: Secondary | ICD-10-CM | POA: Diagnosis not present

## 2020-12-15 DIAGNOSIS — Z91018 Allergy to other foods: Secondary | ICD-10-CM | POA: Diagnosis not present

## 2020-12-15 DIAGNOSIS — F32A Depression, unspecified: Secondary | ICD-10-CM | POA: Diagnosis not present

## 2020-12-15 DIAGNOSIS — Z91040 Latex allergy status: Secondary | ICD-10-CM | POA: Diagnosis not present

## 2020-12-15 DIAGNOSIS — R0789 Other chest pain: Secondary | ICD-10-CM | POA: Diagnosis not present

## 2020-12-15 DIAGNOSIS — Z888 Allergy status to other drugs, medicaments and biological substances status: Secondary | ICD-10-CM | POA: Diagnosis not present

## 2020-12-15 DIAGNOSIS — I10 Essential (primary) hypertension: Secondary | ICD-10-CM | POA: Diagnosis not present

## 2020-12-15 DIAGNOSIS — I422 Other hypertrophic cardiomyopathy: Secondary | ICD-10-CM | POA: Diagnosis not present

## 2020-12-15 DIAGNOSIS — Z8249 Family history of ischemic heart disease and other diseases of the circulatory system: Secondary | ICD-10-CM | POA: Diagnosis not present

## 2020-12-15 DIAGNOSIS — F419 Anxiety disorder, unspecified: Secondary | ICD-10-CM | POA: Diagnosis not present

## 2020-12-15 HISTORY — DX: Cardiomegaly: I51.7

## 2020-12-15 LAB — CBC
HCT: 35.4 % — ABNORMAL LOW (ref 36.0–46.0)
Hemoglobin: 11.7 g/dL — ABNORMAL LOW (ref 12.0–15.0)
MCH: 29.8 pg (ref 26.0–34.0)
MCHC: 33.1 g/dL (ref 30.0–36.0)
MCV: 90.1 fL (ref 80.0–100.0)
Platelets: 255 10*3/uL (ref 150–400)
RBC: 3.93 MIL/uL (ref 3.87–5.11)
RDW: 13.2 % (ref 11.5–15.5)
WBC: 5.7 10*3/uL (ref 4.0–10.5)
nRBC: 0 % (ref 0.0–0.2)

## 2020-12-15 LAB — ECHOCARDIOGRAM COMPLETE
AR max vel: 2.06 cm2
AV Area VTI: 2.03 cm2
AV Area mean vel: 2.01 cm2
AV Mean grad: 3 mmHg
AV Peak grad: 5.9 mmHg
Ao pk vel: 1.21 m/s
Area-P 1/2: 3.16 cm2
Height: 65 in
MV VTI: 1.82 cm2
S' Lateral: 2.3 cm
Weight: 2825.42 oz

## 2020-12-15 LAB — BASIC METABOLIC PANEL
Anion gap: 12 (ref 5–15)
BUN: 10 mg/dL (ref 6–20)
CO2: 23 mmol/L (ref 22–32)
Calcium: 8.5 mg/dL — ABNORMAL LOW (ref 8.9–10.3)
Chloride: 103 mmol/L (ref 98–111)
Creatinine, Ser: 0.91 mg/dL (ref 0.44–1.00)
GFR, Estimated: >60 mL/min (ref >60)
Glucose, Bld: 94 mg/dL (ref 70–99)
Potassium: 3.7 mmol/L (ref 3.5–5.1)
Sodium: 138 mmol/L (ref 135–145)

## 2020-12-15 LAB — HIV ANTIBODY (ROUTINE TESTING W REFLEX): HIV Screen 4th Generation wRfx: NONREACTIVE

## 2020-12-15 LAB — TROPONIN I (HIGH SENSITIVITY): Troponin I (High Sensitivity): 4 ng/L (ref <18)

## 2020-12-15 LAB — MRSA PCR SCREENING: MRSA by PCR: NEGATIVE

## 2020-12-15 MED ORDER — DIPHENHYDRAMINE HCL 50 MG/ML IJ SOLN
25.0000 mg | Freq: Once | INTRAMUSCULAR | Status: AC
  Administered 2020-12-15: 25 mg via INTRAVENOUS
  Filled 2020-12-15: qty 1

## 2020-12-15 MED ORDER — PANTOPRAZOLE SODIUM 40 MG PO TBEC
40.0000 mg | DELAYED_RELEASE_TABLET | Freq: Two times a day (BID) | ORAL | Status: DC
  Administered 2020-12-15 – 2020-12-20 (×9): 40 mg via ORAL
  Filled 2020-12-15 (×9): qty 1

## 2020-12-15 MED ORDER — SUCRALFATE 1 GM/10ML PO SUSP
1.0000 g | Freq: Three times a day (TID) | ORAL | Status: DC
  Administered 2020-12-15 – 2020-12-20 (×19): 1 g via ORAL
  Filled 2020-12-15 (×18): qty 10

## 2020-12-15 NOTE — H&P (View-Only) (Signed)
Referring Provider:  [facility]         Primary Care Physician:  [name], [name], MD Primary Gastroenterologist:  Wilfrid Lund, MD            We were asked to see this patient for:  dysphagia              ASSESSMENT / PLAN:   # 60 yo female with one week history of dysphagia. Can tolerate soft foods and liquids so food impaction seems unlikely though she still feel like food is stuck in esophagus.  --If remains medically stable and EEG negative we can plan of an EGD tomorrow. An esophagram is not unreasonable but services not available on weekend. The risks and benefits of EGD were discussed and the patient agrees to proceed .  --Would give only full liquids today, NPO after MN  # RUE tremors, she says it just started this admission  # Syncopal episode yesterday. Neurology has evaluated, doesn't think she had a CVA but cannot get MRI due to spinal stimulator. Seizure also seems to be low on list of possibilities. EEG is pending.       Attending Physician Note   I have taken a history, examined the patient and reviewed the chart. I agree with the Advanced Practitioner's note, impression and recommendations.  Dysphagia/odynophagia for one week. She tolerates liquids and soft food much better than solids. R/O esophagitis, stricture, ulcer. EGD with possible dilation after syncope evaluation completed, EEG read and she is cleared to proceed. Full liquids for now and do not advance. Change pantoprazole to 40 mg po bid and add sucralfate suspension 1g ac & hs.    Lucio Edward, MD [facility] [phone]     HPI:                                                                                                                             Chief Complaint: swallowing problems  [name] is a 60 y.o. female with history of colon polyps, HTN, hyperlipidemia, anxiety, chronic back pain has spinal stenosis, right drop food, spinal stimulator  Patient say PCP on 12/14/20 with  chest pain and dysphagia. Plan was to try a PPI.[name] EKG showed no changes compared to one in Nov 2020. ED was offered patient wanted to wait. CXR was negative. While at PCP's office patient collapsed with seizure like activity then became unresponsive for a couple of minutes. When she regain consciousness she was dizzy. Patient sent to ED.   In ED her CBC, BMET, Troponin were unremarkable. . EKG normal. She was not orthostatic.  Neurology has evaluated. Seizure not suspected but EEG obtained and results pending.  CTA head and neck negative for posterior circulation disease.  Echocardiogram results pending. Neurology suspects benign paroxysmal positional vertigo secondary to fall and mild head injury. Started on baby asa for stroke prophylaxis and PT for vestibular therapy.  Regarding dysphagia, patient says it started a  week ago. She noticed a couple of hours after eating that she had discomfort in chest, felt like food didn't "digest". Since then she has poorly tolerated anything other than soft food and liquids. Anything else seems to get hung up in her esophagus and causes discomfort. She has not regurgitated anything. This has never happened to her before. She han't had any recent antibiotics, does not use steroid based inhalers. Evaluated by SLP but swallowing test not performed as patient felt like she still has food lodged in her chest.    PREVIOUS ENDOSCOPIC EVALUATIONS / PERTINENT STUDIES    Nov 2018 polyp surveillance colonoscopy  -One 2 mm polyp in the ascending colon, removed with a cold biopsy forceps. Resected and retrieved. - Melanosis in the colon. - The examination was otherwise normal on direct and retroflexion views.  Path = tubular adenoma  Past Medical History:  Diagnosis Date  . Anxiety   . Back pain    related to spinal stenosis and disc problem, radiates down left buttocks to leg., weakness occ.  [name] Dyspnea   . Headache   . Hyperlipidemia   . Hypertension   . Lumbar  post-laminectomy syndrome   . PONV (postoperative nausea and vomiting)   . Vaginal foreign object    "Uses Femring"    Past Surgical History:  Procedure Laterality Date  . ABDOMINAL HYSTERECTOMY    . CARDIAC CATHETERIZATION N/A 04/18/2015   Procedure: Left Heart Cath and Coronary Angiography;  Surgeon: [name], MD;  Location: [place] [facility];  Service: Cardiovascular;  Laterality: N/A;  . COLONOSCOPY W/ BIOPSIES AND POLYPECTOMY    . FOOT SURGERY Bilateral    Faxon "bunion,bone spur, tendon" (1) -6'16, (1)-10'16  . IR EPIDUROGRAPHY  07/21/2018  . LUMBAR LAMINECTOMY/DECOMPRESSION MICRODISCECTOMY Bilateral 12/28/2015   Procedure: MICRO LUMBAR DECOMPRESSION L4 - L5 BILATERALLY;  Surgeon: [name], MD;  Location: [facility];  Service: Orthopedics;  Laterality: Bilateral;  . LUMBAR LAMINECTOMY/DECOMPRESSION MICRODISCECTOMY Bilateral 03/04/2018   Procedure: Revision of Microlumbar Decompression Bilateral Lumbar Four-Five;  Surgeon: [name], MD;  Location: [place];  Service: Orthopedics;  Laterality: Bilateral;  90 mins  . SPINAL CORD STIMULATOR INSERTION N/A 09/28/2019   Procedure: THORACIC SPINAL CORD STIMULATOR INSERTION;  Surgeon: Melina Schools, MD;  Location: [place];  Service: Orthopedics;  Laterality: N/A;  2.5 hrs  . TUBAL LIGATION    . WISDOM TOOTH EXTRACTION    . WOUND EXPLORATION N/A 03/04/2018   Procedure: EXPLORATION OF LUMBAR DECOMPRESSION WOUND;  Surgeon: [name], MD;  Location: [place];  Service: Orthopedics;  Laterality: N/A;    Prior to Admission medications   Medication Sig Start Date End Date Taking? Authorizing Provider  ALPRAZolam (XANAX) 0.25 MG tablet TAKE 1 TABLET BY MOUTH ONCE DAILY AS NEEDED FOR ANXIETY OR SLEEP Patient taking differently: Take 0.25 mg by mouth daily as needed for anxiety or sleep. 12/13/19  Yes [name], [name], MD  atorvastatin (LIPITOR) 20 MG tablet Take 1 tablet (20 mg total) by mouth daily. 04/22/20  Yes [name], [name], MD   carvedilol (COREG) 6.25 MG tablet TAKE 1 TABLET BY MOUTH TWICE DAILY (NEED APPT) Patient taking differently: Take 6.25 mg by mouth 2 (two) times daily with a meal. 11/23/20  Yes [name], [name], MD  cholecalciferol (VITAMIN D3) 25 MCG (1000 UNIT) tablet Take 1,000 Units by mouth daily.   Yes [provider]  diphenhydrAMINE (BENADRYL) 25 MG tablet Take 25 mg by mouth daily as needed for allergies.  Yes [provider]  DULoxetine (CYMBALTA) 30 MG capsule Take 1 capsule (30 mg total) by mouth daily. 08/15/20  Yes Meredith Staggers, MD  gabapentin (NEURONTIN) 400 MG capsule Take 400 mg by mouth 3 (three) times daily. 07/09/20  Yes [provider]  methocarbamol (ROBAXIN) 500 MG tablet Take 500 mg by mouth every 8 (eight) hours as needed for muscle spasms.   Yes [provider]  omeprazole (PRILOSEC) 40 MG capsule Take 1 capsule (40 mg total) by mouth daily. 12/14/20  Yes [name], [name], MD  traMADol (ULTRAM) 50 MG tablet Take 50 mg by mouth every 6 (six) hours as needed for moderate pain. Dr. Nelva Bush prescribing   Yes [provider]  polyethylene glycol (MIRALAX / GLYCOLAX) packet Take 17 g by mouth daily. Patient not taking: No sig reported 03/20/18   Bary Leriche, PA-C    Current Facility-Administered Medications  Medication Dose Route Frequency Provider Last Rate Last Admin  . 0.9 %  sodium chloride infusion  250 mL Intravenous PRN Bonnell Public Tublu, MD      . acetaminophen (TYLENOL) tablet 650 mg  650 mg Oral Q6H PRN Bonnell Public Tublu, MD   650 mg at 12/15/20 0748   Or  . acetaminophen (TYLENOL) suppository 650 mg  650 mg Rectal Q6H PRN Vashti Hey, MD      . ALPRAZolam Duanne Moron) tablet 0.25 mg  0.25 mg Oral Daily PRN Bonnell Public Tublu, MD   0.25 mg at 12/14/20 2046  . atorvastatin (LIPITOR) tablet 20 mg  20 mg Oral Daily Bonnell Public Tublu, MD   20 mg at 12/15/20 1035  . carvedilol (COREG) tablet 6.25 mg   6.25 mg Oral BID WC Bonnell Public Tublu, MD   6.25 mg at 12/15/20 0748  . DULoxetine (CYMBALTA) DR capsule 30 mg  30 mg Oral Daily Bonnell Public Tublu, MD   30 mg at 12/15/20 1035  . gabapentin (NEURONTIN) capsule 400 mg  400 mg Oral TID Bonnell Public Tublu, MD   400 mg at 12/15/20 1034  . methocarbamol (ROBAXIN) tablet 500 mg  500 mg Oral Q8H PRN Bonnell Public Tublu, MD      . pantoprazole (PROTONIX) EC tablet 80 mg  80 mg Oral Daily Bonnell Public Tublu, MD   80 mg at 12/15/20 1035  . sodium chloride flush (NS) 0.9 % injection 3 mL  3 mL Intravenous Q12H Bonnell Public Tublu, MD   3 mL at 12/15/20 1038  . sodium chloride flush (NS) 0.9 % injection 3 mL  3 mL Intravenous PRN Vashti Hey, MD   3 mL at 12/14/20 2134  . traMADol (ULTRAM) tablet 50 mg  50 mg Oral Q6H PRN Vashti Hey, MD   50 mg at 12/14/20 2307    Allergies as of 12/14/2020 - Review Complete 12/14/2020  Allergen Reaction Noted  . Cephalosporins Anaphylaxis 11/13/2017  . Penicillins Anaphylaxis and Hives 12/09/2012  . Anesthetics, amide Nausea And Vomiting 12/18/2015  . Betadine [povidone iodine] Other (See Comments) 09/28/2019  . Peach [prunus persica] Hives 08/10/2017  . Claritin [loratadine]  03/28/2020  . Latex Hives, Itching, and Rash 07/09/2017    Family History  Problem Relation Age of Onset  . Heart attack Mother   . Lung cancer Father   . Cancer Father   . Pancreatic cancer Sister   . Breast cancer Sister 81  . Throat cancer Brother   . Multiple myeloma Sister   . Breast cancer Sister  diagnosed in her 75's  . Heart attack Sister   . Stomach cancer Cousin   . Colon cancer Neg Hx     Social History   Socioeconomic History  . Marital status: Married    Spouse name: Not on file  . Number of children: Not on file  . Years of education: Not on file  . Highest education level: Not on file  Occupational History  . Not on file  Tobacco Use   . Smoking status: Never Smoker  . Smokeless tobacco: Never Used  Vaping Use  . Vaping Use: Never used  Substance and Sexual Activity  . Alcohol use: No  . Drug use: No  . Sexual activity: Yes    Partners: Male    Birth control/protection: Post-menopausal  Other Topics Concern  . Not on file  Social History Narrative  . Not on file   Social Determinants of Health   Financial Resource Strain: Not on file  Food Insecurity: Not on file  Transportation Needs: Not on file  Physical Activity: Not on file  Stress: Not on file  Social Connections: Not on file  Intimate Partner Violence: Not on file    Review of Systems: All systems reviewed and negative except where noted in HPI.  OBJECTIVE:    Physical Exam: Vital signs in last 24 hours: Temp:  [97.6 F (36.4 C)-98.8 F (37.1 C)] 97.6 F (36.4 C) (02/19 0722) Pulse Rate:  [69-94] 72 (02/19 0722) Resp:  [12-25] 13 (02/19 0722) BP: (116-182)/(58-141) 130/88 (02/19 0722) SpO2:  [96 %-100 %] 100 % (02/19 0722) Weight:  [76.2 kg-80.1 kg] 80.1 kg (02/18 1922)   General:   Alert  female in NAD Psych:  Pleasant, cooperative. Normal mood and affect. Eyes:  Pupils equal, sclera clear, no icterus.   Conjunctiva pink. Ears:  Normal auditory acuity. Nose:  No deformity, discharge,  or lesions. Neck:  Supple; no masses Lungs:  Clear throughout to auscultation.   No wheezes, crackles, or rhonchi.  Heart:  Regular rate and rhythm,  no lower extremity edema Abdomen:  Soft, non-distended, nontender, BS active, no palp mass   Rectal:  Deferred  Msk:  Symmetrical without gross deformities. . Neurologic:  Alert and  oriented x4;  grossly normal neurologically. Tremors in RUE Skin:  Intact without significant lesions or rashes.  Filed Weights   12/14/20 1435 12/14/20 1922  Weight: 76.2 kg 80.1 kg     Scheduled inpatient medications . atorvastatin  20 mg Oral Daily  . carvedilol  6.25 mg Oral BID WC  . DULoxetine  30 mg Oral  Daily  . gabapentin  400 mg Oral TID  . pantoprazole  80 mg Oral Daily  . sodium chloride flush  3 mL Intravenous Q12H      Intake/Output from previous day: 02/18 0701 - 02/19 0700 In: 1005 [I.V.:5; IV Piggyback:1000] Out: 850 [Urine:850] Intake/Output this shift: No intake/output data recorded.   Lab Results: Recent Labs    12/14/20 1010 12/14/20 1230 12/15/20 0051  WBC 3.9* 4.3 5.7  HGB 12.3 12.6 11.7*  HCT 37.4 39.2 35.4*  PLT 264.0 284 255   BMET Recent Labs    12/14/20 1010 12/14/20 1230 12/15/20 0051  NA 142 139 138  K 4.5 3.9 3.7  CL 105 103 103  CO2 33* 28 23  GLUCOSE 90 96 94  BUN $Re'12 9 10  'MpN$ CREATININE 1.07 0.99 0.91  CALCIUM 9.4 9.0 8.5*   LFT No results for input(s): PROT, ALBUMIN, AST,  ALT, ALKPHOS, BILITOT, BILIDIR, IBILI in the last 72 hours. PT/INR No results for input(s): LABPROT, INR in the last 72 hours. Hepatitis Panel No results for input(s): HEPBSAG, HCVAB, HEPAIGM, HEPBIGM in the last 72 hours.   . CBC Latest Ref Rng & Units 12/15/2020 12/14/2020 12/14/2020  WBC 4.0 - 10.5 K/uL 5.7 4.3 3.9(L)  Hemoglobin 12.0 - 15.0 g/dL 11.7(L) 12.6 12.3  Hematocrit 36.0 - 46.0 % 35.4(L) 39.2 37.4  Platelets 150 - 400 K/uL 255 284 264.0    . CMP Latest Ref Rng & Units 12/15/2020 12/14/2020 12/14/2020  Glucose 70 - 99 mg/dL 94 96 90  BUN 6 - 20 mg/dL $Remove'10 9 12  'hJHYgYG$ Creatinine 0.44 - 1.00 mg/dL 0.91 0.99 1.07  Sodium 135 - 145 mmol/L 138 139 142  Potassium 3.5 - 5.1 mmol/L 3.7 3.9 4.5  Chloride 98 - 111 mmol/L 103 103 105  CO2 22 - 32 mmol/L 23 28 33(H)  Calcium 8.9 - 10.3 mg/dL 8.5(L) 9.0 9.4  Total Protein 6.0 - 8.3 g/dL - - -  Total Bilirubin 0.2 - 1.2 mg/dL - - -  Alkaline Phos 39 - 117 U/L - - -  AST 0 - 37 U/L - - -  ALT 0 - 35 U/L - - -   Studies/Results: CT ANGIO HEAD W OR WO CONTRAST  Result Date: 12/14/2020 CLINICAL DATA:  Recurrent syncopal episode. EXAM: CT ANGIOGRAPHY HEAD AND NECK TECHNIQUE: Multidetector CT imaging of the head and neck  was performed using the standard protocol during bolus administration of intravenous contrast. Multiplanar CT image reconstructions and MIPs were obtained to evaluate the vascular anatomy. Carotid stenosis measurements (when applicable) are obtained utilizing NASCET criteria, using the distal internal carotid diameter as the denominator. CONTRAST:  65mL OMNIPAQUE IOHEXOL 350 MG/ML SOLN COMPARISON:  None. FINDINGS: CT HEAD FINDINGS Brain: The brain shows a normal appearance without evidence of malformation, atrophy, old or acute small or large vessel infarction, mass lesion, hemorrhage, hydrocephalus or extra-axial collection. Vascular: No hyperdense vessel. No evidence of atherosclerotic calcification. Skull: Normal.  No traumatic finding.  No focal bone lesion. Sinuses/Orbits: Sinuses are clear. Orbits appear normal. Mastoids are clear. Other: None significant CTA NECK FINDINGS Aortic arch: Minimal aortic atherosclerosis. Branching pattern is normal without origin stenosis. Right carotid system: Common carotid artery widely patent to bifurcation. Carotid bifurcation shows minimal atherosclerotic plaque but no stenosis. Cervical ICA widely patent. Left carotid system: Common carotid artery widely patent to the bifurcation. Carotid bifurcation shows mild atherosclerotic plaque but no stenosis. Cervical ICA widely patent. Vertebral arteries: Both vertebral arteries are widely patent at their origin and through the cervical region to the foramen magnum. Skeleton: Ordinary cervical spondylosis. Other neck: No mass or lymphadenopathy. Upper chest: Normal Review of the MIP images confirms the above findings CTA HEAD FINDINGS Anterior circulation: Both internal carotid arteries are widely patent through the skull base and siphon regions. The anterior and middle cerebral vessels are patent without stenosis or occlusion, aneurysm or vascular malformation. Posterior circulation: Both vertebral arteries widely patent to the  basilar. No basilar stenosis. Posterior circulation branch vessels are normal. Venous sinuses: Patent and normal. Anatomic variants: None significant. Review of the MIP images confirms the above findings IMPRESSION: Minimal aortic atherosclerosis. Minimal atherosclerotic change at both carotid bifurcations but no stenosis. No intracranial large or medium vessel occlusion or correctable proximal stenosis. Aortic Atherosclerosis (ICD10-I70.0). Electronically Signed   By: Nelson Chimes M.D.   On: 12/14/2020 22:32   DG Chest 2 View  Result  Date: 12/14/2020 CLINICAL DATA:  Mid chest pain for 5 days radiating to LEFT axilla, no history of trauma EXAM: CHEST - 2 VIEW COMPARISON:  03/16/2020 FINDINGS: Intraspinal stimulator leads again identified. Normal heart size, mediastinal contours, and pulmonary vascularity. Lungs clear. No pulmonary infiltrate, pleural effusion, or pneumothorax. Osseous structures unremarkable. IMPRESSION: No acute abnormalities. Electronically Signed   By: Lavonia Dana M.D.   On: 12/14/2020 18:51   CT ANGIO NECK W OR WO CONTRAST  Result Date: 12/14/2020 CLINICAL DATA:  Recurrent syncopal episode. EXAM: CT ANGIOGRAPHY HEAD AND NECK TECHNIQUE: Multidetector CT imaging of the head and neck was performed using the standard protocol during bolus administration of intravenous contrast. Multiplanar CT image reconstructions and MIPs were obtained to evaluate the vascular anatomy. Carotid stenosis measurements (when applicable) are obtained utilizing NASCET criteria, using the distal internal carotid diameter as the denominator. CONTRAST:  69mL OMNIPAQUE IOHEXOL 350 MG/ML SOLN COMPARISON:  None. FINDINGS: CT HEAD FINDINGS Brain: The brain shows a normal appearance without evidence of malformation, atrophy, old or acute small or large vessel infarction, mass lesion, hemorrhage, hydrocephalus or extra-axial collection. Vascular: No hyperdense vessel. No evidence of atherosclerotic calcification. Skull:  Normal.  No traumatic finding.  No focal bone lesion. Sinuses/Orbits: Sinuses are clear. Orbits appear normal. Mastoids are clear. Other: None significant CTA NECK FINDINGS Aortic arch: Minimal aortic atherosclerosis. Branching pattern is normal without origin stenosis. Right carotid system: Common carotid artery widely patent to bifurcation. Carotid bifurcation shows minimal atherosclerotic plaque but no stenosis. Cervical ICA widely patent. Left carotid system: Common carotid artery widely patent to the bifurcation. Carotid bifurcation shows mild atherosclerotic plaque but no stenosis. Cervical ICA widely patent. Vertebral arteries: Both vertebral arteries are widely patent at their origin and through the cervical region to the foramen magnum. Skeleton: Ordinary cervical spondylosis. Other neck: No mass or lymphadenopathy. Upper chest: Normal Review of the MIP images confirms the above findings CTA HEAD FINDINGS Anterior circulation: Both internal carotid arteries are widely patent through the skull base and siphon regions. The anterior and middle cerebral vessels are patent without stenosis or occlusion, aneurysm or vascular malformation. Posterior circulation: Both vertebral arteries widely patent to the basilar. No basilar stenosis. Posterior circulation branch vessels are normal. Venous sinuses: Patent and normal. Anatomic variants: None significant. Review of the MIP images confirms the above findings IMPRESSION: Minimal aortic atherosclerosis. Minimal atherosclerotic change at both carotid bifurcations but no stenosis. No intracranial large or medium vessel occlusion or correctable proximal stenosis. Aortic Atherosclerosis (ICD10-I70.0). Electronically Signed   By: Nelson Chimes M.D.   On: 12/14/2020 22:32    Principal Problem:   Postural dizziness with presyncope Active Problems:   Chest pain   Essential hypertension   Anxiety disorder, unspecified   Chronic pain   Lumbar post-laminectomy  syndrome    Tye Savoy, NP-C @  12/15/2020, 10:50 AM

## 2020-12-15 NOTE — Progress Notes (Signed)
PROGRESS NOTE  [name] HUD:[id number] DOB: [birth year] DOA: 12/14/2020 PCP: [name], [name], MD   LOS: 0 days   Brief Narrative / Interim history: 60 year old female with history of hypertension, chronic lower back pain status post spinal stimulator, comes to the hospital with complaints of syncope.  Over the last week, patient has been complaining of significant solid food dysphagia associated with chest pain, getting very scared at times, and overall feeling very weak, lightheaded and dizzy at times.  She also reports new onset of right arm tremors.  She had to make significant changes in her diet, changed to soft/pure and liquids as she tolerates better these foods.  She went to see her PCP the other day, who sent her for an x-ray and while getting imaging she passed out.  Subjective / 24h Interval events: Complains of persistent lightheadedness, ongoing chest discomfort, she is afraid to eat  Assessment & Plan: Principal Problem Dizziness, syncope -unclear etiology, neurology consulted and appreciate input.  We are unable to do an MRI due to her spinal implant stimulator.  Neurology suspects BPPV possibly as a consequence of the fall and mild head injury, but cannot completely exclude a small stroke.  EEG pending  Active Problems Dysphagia, chest pain-this has been relatively sudden about a week ago, has been persistent and her chest pain is related to eating.  Her troponin is negative making ACS very unlikely given constant chest pain for the past week which is worse with food.  Obtain a 2D echo.  GI consulted and plans for EGD tomorrow  Chronic back pain -Spinal stimulator in place, continue home medications  Hyperlipidemia -Continue atorvastatin  Hypertension -continue Coreg  Scheduled Meds: . atorvastatin  20 mg Oral Daily  . carvedilol  6.25 mg Oral BID WC  . DULoxetine  30 mg Oral Daily  . gabapentin  400 mg Oral TID  . pantoprazole  80 mg Oral Daily  .  sodium chloride flush  3 mL Intravenous Q12H   Continuous Infusions: . sodium chloride     PRN Meds:.sodium chloride, acetaminophen **OR** acetaminophen, ALPRAZolam, methocarbamol, sodium chloride flush, traMADol  Diet Orders (From admission, onward)    Start     Ordered   12/16/20 0001  Diet NPO time specified  Diet effective midnight        12/15/20 1137   12/15/20 1138  Diet full liquid Room service appropriate? Yes; Fluid consistency: Thin  Diet effective now       Question Answer Comment  Room service appropriate? Yes   Fluid consistency: Thin      12/15/20 1137          DVT prophylaxis:      Code Status: Full Code  Family Communication: no family at bedside   Status is: Observation  The patient will require care spanning > 2 midnights and should be moved to inpatient because: IV treatments appropriate due to intensity of illness or inability to take PO and Inpatient level of care appropriate due to severity of illness  Dispo: The patient is from: Home              Anticipated d/c is to: Home              Anticipated d/c date is: 1 day              Patient currently is not medically stable to d/c.   Difficult to place patient No   Level of care: Telemetry Medical  Consultants:  GI Neurology   Procedures:  2D echo: pending  Microbiology  none  Antimicrobials: none    Objective: Vitals:   12/14/20 1922 12/14/20 2314 12/15/20 0346 12/15/20 0722  BP: (!) 172/97 (!) 141/94 116/83 130/88  Pulse: 77 73 71 72  Resp: 17 14 14 13   Temp: 98.3 F (36.8 C) 98.2 F (36.8 C) 98.1 F (36.7 C) 97.6 F (36.4 C)  TempSrc: Oral Oral Oral Oral  SpO2: 100% 96% 99% 100%  Weight: 80.1 kg     Height: 5\' 5"  (1.651 m)       Intake/Output Summary (Last 24 hours) at 12/15/2020 1150 Last data filed at 12/15/2020 0353 Gross per 24 hour  Intake 1005 ml  Output 850 ml  Net 155 ml   Filed Weights   12/14/20 1435 12/14/20 1922  Weight: 76.2 kg 80.1 kg     Examination:  Constitutional: NAD Eyes: no scleral icterus ENMT: Mucous membranes are moist.  Neck: normal, supple Respiratory: clear to auscultation bilaterally, no wheezing, no crackles. Normal respiratory effort. Cardiovascular: Regular rate and rhythm, no murmurs / rubs / gallops. No edema Abdomen: non distended, no tenderness. Bowel sounds positive.  Musculoskeletal: no clubbing / cyanosis.  Skin: no rashes Neurologic: CN 2-12 grossly intact. Strength 5/5 in all 4. Resting tremor right hand  Data Reviewed: I have independently reviewed following labs and imaging studies   CBC: Recent Labs  Lab 12/14/20 1010 12/14/20 1230 12/15/20 0051  WBC 3.9* 4.3 5.7  HGB 12.3 12.6 11.7*  HCT 37.4 39.2 35.4*  MCV 90.1 91.6 90.1  PLT 264.0 284 852   Basic Metabolic Panel: Recent Labs  Lab 12/14/20 1010 12/14/20 1230 12/15/20 0051  NA 142 139 138  K 4.5 3.9 3.7  CL 105 103 103  CO2 33* 28 23  GLUCOSE 90 96 94  BUN 12 9 10   CREATININE 1.07 0.99 0.91  CALCIUM 9.4 9.0 8.5*   Liver Function Tests: No results for input(s): AST, ALT, ALKPHOS, BILITOT, PROT, ALBUMIN in the last 168 hours. Coagulation Profile: No results for input(s): INR, PROTIME in the last 168 hours. HbA1C: No results for input(s): HGBA1C in the last 72 hours. CBG: Recent Labs  Lab 12/14/20 1352  GLUCAP 80    Recent Results (from the past 240 hour(s))  SARS CORONAVIRUS 2 (TAT 6-24 HRS) Nasopharyngeal Nasopharyngeal Swab     Status: None   Collection Time: 12/14/20  2:16 PM   Specimen: Nasopharyngeal Swab  Result Value Ref Range Status   SARS Coronavirus 2 NEGATIVE NEGATIVE Final    Comment: (NOTE) SARS-CoV-2 target nucleic acids are NOT DETECTED.  The SARS-CoV-2 RNA is generally detectable in upper and lower respiratory specimens during the acute phase of infection. Negative results do not preclude SARS-CoV-2 infection, do not rule out co-infections with other pathogens, and should not be used  as the sole basis for treatment or other patient management decisions. Negative results must be combined with clinical observations, patient history, and epidemiological information. The expected result is Negative.  Fact Sheet for Patients: SugarRoll.be  Fact Sheet for Healthcare Providers: https://www.woods-mathews.com/  This test is not yet approved or cleared by the [place] FDA and  has been authorized for detection and/or diagnosis of SARS-CoV-2 by FDA under an Emergency Use Authorization (EUA). This EUA will remain  in effect (meaning this test can be used) for the duration of the COVID-19 declaration under Se ction 564(b)(1) of the Act, 21 U.S.C. section 360bbb-3(b)(1), unless the authorization is  terminated or revoked sooner.  Performed at [facility], [place] [address] [place], [place] [address]   MRSA PCR Screening     Status: None   Collection Time: 12/14/20 11:37 PM   Specimen: Nasopharyngeal  Result Value Ref Range Status   MRSA by PCR NEGATIVE NEGATIVE Final    Comment:        The GeneXpert MRSA Assay (FDA approved for NASAL specimens only), is one component of a comprehensive MRSA colonization surveillance program. It is not intended to diagnose MRSA infection nor to guide or monitor treatment for MRSA infections. Performed at [facility], [place] [address], [place], [place] [address]      Radiology Studies: CT ANGIO HEAD W OR WO CONTRAST  Result Date: 12/14/2020 CLINICAL DATA:  Recurrent syncopal episode. EXAM: CT ANGIOGRAPHY HEAD AND NECK TECHNIQUE: Multidetector CT imaging of the head and neck was performed using the standard protocol during bolus administration of intravenous contrast. Multiplanar CT image reconstructions and MIPs were obtained to evaluate the vascular anatomy. Carotid stenosis measurements (when applicable) are obtained utilizing NASCET criteria, using the distal internal carotid  diameter as the denominator. CONTRAST:  17mL OMNIPAQUE IOHEXOL 350 MG/ML SOLN COMPARISON:  None. FINDINGS: CT HEAD FINDINGS Brain: The brain shows a normal appearance without evidence of malformation, atrophy, old or acute small or large vessel infarction, mass lesion, hemorrhage, hydrocephalus or extra-axial collection. Vascular: No hyperdense vessel. No evidence of atherosclerotic calcification. Skull: Normal.  No traumatic finding.  No focal bone lesion. Sinuses/Orbits: Sinuses are clear. Orbits appear normal. Mastoids are clear. Other: None significant CTA NECK FINDINGS Aortic arch: Minimal aortic atherosclerosis. Branching pattern is normal without origin stenosis. Right carotid system: Common carotid artery widely patent to bifurcation. Carotid bifurcation shows minimal atherosclerotic plaque but no stenosis. Cervical ICA widely patent. Left carotid system: Common carotid artery widely patent to the bifurcation. Carotid bifurcation shows mild atherosclerotic plaque but no stenosis. Cervical ICA widely patent. Vertebral arteries: Both vertebral arteries are widely patent at their origin and through the cervical region to the foramen magnum. Skeleton: Ordinary cervical spondylosis. Other neck: No mass or lymphadenopathy. Upper chest: Normal Review of the MIP images confirms the above findings CTA HEAD FINDINGS Anterior circulation: Both internal carotid arteries are widely patent through the skull base and siphon regions. The anterior and middle cerebral vessels are patent without stenosis or occlusion, aneurysm or vascular malformation. Posterior circulation: Both vertebral arteries widely patent to the basilar. No basilar stenosis. Posterior circulation branch vessels are normal. Venous sinuses: Patent and normal. Anatomic variants: None significant. Review of the MIP images confirms the above findings IMPRESSION: Minimal aortic atherosclerosis. Minimal atherosclerotic change at both carotid bifurcations but  no stenosis. No intracranial large or medium vessel occlusion or correctable proximal stenosis. Aortic Atherosclerosis (ICD10-I70.0). Electronically Signed   By: Nelson Chimes M.D.   On: 12/14/2020 22:32   CT ANGIO NECK W OR WO CONTRAST  Result Date: 12/14/2020 CLINICAL DATA:  Recurrent syncopal episode. EXAM: CT ANGIOGRAPHY HEAD AND NECK TECHNIQUE: Multidetector CT imaging of the head and neck was performed using the standard protocol during bolus administration of intravenous contrast. Multiplanar CT image reconstructions and MIPs were obtained to evaluate the vascular anatomy. Carotid stenosis measurements (when applicable) are obtained utilizing NASCET criteria, using the distal internal carotid diameter as the denominator. CONTRAST:  16mL OMNIPAQUE IOHEXOL 350 MG/ML SOLN COMPARISON:  None. FINDINGS: CT HEAD FINDINGS Brain: The brain shows a normal appearance without evidence of malformation, atrophy, old or  acute small or large vessel infarction, mass lesion, hemorrhage, hydrocephalus or extra-axial collection. Vascular: No hyperdense vessel. No evidence of atherosclerotic calcification. Skull: Normal.  No traumatic finding.  No focal bone lesion. Sinuses/Orbits: Sinuses are clear. Orbits appear normal. Mastoids are clear. Other: None significant CTA NECK FINDINGS Aortic arch: Minimal aortic atherosclerosis. Branching pattern is normal without origin stenosis. Right carotid system: Common carotid artery widely patent to bifurcation. Carotid bifurcation shows minimal atherosclerotic plaque but no stenosis. Cervical ICA widely patent. Left carotid system: Common carotid artery widely patent to the bifurcation. Carotid bifurcation shows mild atherosclerotic plaque but no stenosis. Cervical ICA widely patent. Vertebral arteries: Both vertebral arteries are widely patent at their origin and through the cervical region to the foramen magnum. Skeleton: Ordinary cervical spondylosis. Other neck: No mass or  lymphadenopathy. Upper chest: Normal Review of the MIP images confirms the above findings CTA HEAD FINDINGS Anterior circulation: Both internal carotid arteries are widely patent through the skull base and siphon regions. The anterior and middle cerebral vessels are patent without stenosis or occlusion, aneurysm or vascular malformation. Posterior circulation: Both vertebral arteries widely patent to the basilar. No basilar stenosis. Posterior circulation branch vessels are normal. Venous sinuses: Patent and normal. Anatomic variants: None significant. Review of the MIP images confirms the above findings IMPRESSION: Minimal aortic atherosclerosis. Minimal atherosclerotic change at both carotid bifurcations but no stenosis. No intracranial large or medium vessel occlusion or correctable proximal stenosis. Aortic Atherosclerosis (ICD10-I70.0). Electronically Signed   By: Nelson Chimes M.D.   On: 12/14/2020 22:32   Marzetta Board, MD, PhD [facility]  Between 7 am - 7 pm I am available, please contact me via Amion or Securechat  Between 7 pm - 7 am I am not available, please contact night coverage MD/APP via Amion

## 2020-12-15 NOTE — Progress Notes (Signed)
  Echocardiogram 2D Echocardiogram has been performed.  [name] 12/15/2020, 9:57 AM

## 2020-12-15 NOTE — Progress Notes (Signed)
EEG completed, results pending. 

## 2020-12-15 NOTE — Progress Notes (Signed)
Neurology Progress Note  CC: dizziness, vertigo, syncope, chest pain   Interval History: Trace Cederberg is a 60 y.o. female who was admitted after syncope and fall, with subsequent symptoms as listed above. She notes ongoing chest pain, relieved by morphine. The vertigo persists, but only with neck extension and no diplopia. She clarifies that the syncope came first with associated lightheadedness, and then when she came back to consciousness, she had the vertigo.  ROS: A 14 point ROS was performed and is negative except as noted in the HPI.   Past Medical History:  Diagnosis Date  . Anxiety   . Back pain    related to spinal stenosis and disc problem, radiates down left buttocks to leg., weakness occ.  [name] Dyspnea   . Headache   . Hyperlipidemia   . Hypertension   . Lumbar post-laminectomy syndrome   . PONV (postoperative nausea and vomiting)   . Vaginal foreign object    "Uses Femring"    Family History  Problem Relation Age of Onset  . Heart attack Mother   . Lung cancer Father   . Cancer Father   . Pancreatic cancer Sister   . Breast cancer Sister 3  . Throat cancer Brother   . Multiple myeloma Sister   . Breast cancer Sister        diagnosed in her 35's  . Heart attack Sister   . Stomach cancer Cousin   . Colon cancer Neg Hx     Social History:  reports that she has never smoked. She has never used smokeless tobacco. She reports that she does not drink alcohol and does not use drugs.  Exam: Current vital signs: BP 130/88 (BP Location: Left Arm)   Pulse 72   Temp 97.6 F (36.4 C) (Oral)   Resp 13   Ht $R'5\' 5"'Ce$  (1.651 m)   Wt 80.1 kg   SpO2 100%   BMI 29.39 kg/m  Vital signs in last 24 hours: Temp:  [97.6 F (36.4 C)-98.8 F (37.1 C)] 97.6 F (36.4 C) (02/19 0722) Pulse Rate:  [69-94] 72 (02/19 0722) Resp:  [12-25] 13 (02/19 0722) BP: (116-182)/(58-141) 130/88 (02/19 0722) SpO2:  [96 %-100 %] 100 % (02/19 0722) Weight:  [76.2 kg-80.1 kg] 80.1 kg  (02/18 1922)   Physical Exam  Constitutional: Appears well-developed and well-nourished.  Psych: Affect appropriate to situation Eyes: No scleral injection HENT: No OP obstrucion MSK: no joint deformities.  Cardiovascular: Normal rate and regular rhythm.  Respiratory: Effort normal, non-labored breathing GI: Soft.  No distension. There is no tenderness.  Skin: WDI  Neuro: Mental Status: Patient is awake, alert, oriented to person, place, month, year, and situation. Patient is able to give a clear and coherent history. No signs of aphasia or neglect. Cranial Nerves: II:  Pupils are equal, round, and reactive to light.  III,IV, VI: EOMI without ptosis or diploplia.  V: Facial sensation is symmetric to temperature VII: Facial movement is symmetric.  VIII: hearing is intact to voice X: Uvula elevates symmetrically XI: Shoulder shrug is symmetric. XII: tongue is midline without atrophy or fasciculations.  Motor: Tone is normal. Bulk is normal. 5/5 strength was present in all four extremities. Sensory: Sensation is symmetric to light touch and temperature in the arms and legs. Deep Tendon Reflexes: Hypoactive and symmetric in the biceps and brachioradialis. Cerebellar: FTN is very slow and deliberate, bilaterally. Gait: Not tested  Labs: I have reviewed labs in epic and the results pertinent to this  consultation are: unrevealing  Imaging: I have reviewed the images obtained: echo done, results pending EEG pending CTA negative for posterior circulation disease.  Impression:  1) I suspect BPPV as a consequence of the fall and mild head injury. 2) Less likely would be a small post-concussion effect leading to vertigo 3) perhaps cardiac issues contributed to syncope 4) some volitional aspects of the examination such as very slow (but accurate) finger to nose performance. 5) cannot completely exclude a small stroke, but no localizing exam findings to support this idea and  unable to get MRI to assess this possibility.  Recommendations: - Barany exercises  - PT for vestibular therapy - ASA 81 mg/d for stroke prophylaxis - Stroke risk factor control - Will follow up on EEG results (could be done as outpatient, as index of suspicion is low). - will be available if further questions arise.  Calob Baskette R. Charlsie Merles, MD

## 2020-12-15 NOTE — Plan of Care (Signed)

## 2020-12-15 NOTE — Procedures (Signed)
Patient Name: [name]  MRN: [id number]  Epilepsy Attending: Lora Havens  Referring Physician/Provider: Clance Boll, NP Date: 12/15/2020  Duration: 24.29 mins  Patient history: 60yo F with syncope, vertigo. EEG to evaluate for seizure  Level of alertness: Awake, asleep  AEDs during EEG study: GBP  Technical aspects: This EEG study was done with scalp electrodes positioned according to the 10-20 International system of electrode placement. Electrical activity was acquired at a sampling rate of 500Hz  and reviewed with a high frequency filter of 70Hz  and a low frequency filter of 1Hz . EEG data were recorded continuously and digitally stored.   Description: The posterior dominant rhythm consists of 9-10 Hz activity of moderate voltage (25-35 uV) seen predominantly in posterior head regions, symmetric and reactive to eye opening and eye closing. Sleep was characterized by vertex waves, sleep spindles (12 to 14 Hz), maximal frontocentral region. Hyperventilation did not show any EEG change. Hyperventilation and photic stimulation were not performed.     IMPRESSION: This study is within normal limits. No seizures or epileptiform discharges were seen throughout the recording.  [name]

## 2020-12-15 NOTE — Evaluation (Signed)
SLP Cancellation Note  Patient Details Name: [name] MRN: [id number] DOB: 07/28/1961   Cancelled treatment:       Reason Eval/Treat Not Completed: Other (comment) (Swallow eval received, pt npo as of this am - presuming for GI given symptoms of lodging of food in chest area.  Sill continue efforts for eval to rule out oropharyngeal deficits.  Please discontinue order if deemed no longer indicated.)  Kathleen Lime, MS [facility] SLP Acute Rehab Services Office [phone] Pager [phone]   Macario Golds 12/15/2020, 9:37 AM

## 2020-12-15 NOTE — Consult Note (Addendum)
Referring Provider:  [facility]         Primary Care Physician:  [name], [name], MD Primary Gastroenterologist:  Wilfrid Lund, MD            We were asked to see this patient for:  dysphagia              ASSESSMENT / PLAN:   # 60 yo female with one week history of dysphagia. Can tolerate soft foods and liquids so food impaction seems unlikely though she still feel like food is stuck in esophagus.  --If remains medically stable and EEG negative we can plan of an EGD tomorrow. An esophagram is not unreasonable but services not available on weekend. The risks and benefits of EGD were discussed and the patient agrees to proceed .  --Would give only full liquids today, NPO after MN  # RUE tremors, she says it just started this admission  # Syncopal episode yesterday. Neurology has evaluated, doesn't think she had a CVA but cannot get MRI due to spinal stimulator. Seizure also seems to be low on list of possibilities. EEG is pending.       Attending Physician Note   I have taken a history, examined the patient and reviewed the chart. I agree with the Advanced Practitioner's note, impression and recommendations.  Dysphagia/odynophagia for one week. She tolerates liquids and soft food much better than solids. R/O esophagitis, stricture, ulcer. EGD with possible dilation after syncope evaluation completed, EEG read and she is cleared to proceed. Full liquids for now and do not advance. Change pantoprazole to 40 mg po bid and add sucralfate suspension 1g ac & hs.    Lucio Edward, MD [facility] [phone]     HPI:                                                                                                                             Chief Complaint: swallowing problems  [name] is a 60 y.o. female with history of colon polyps, HTN, hyperlipidemia, anxiety, chronic back pain has spinal stenosis, right drop food, spinal stimulator  Patient say PCP on 12/14/20 with  chest pain and dysphagia. Plan was to try a PPI.[name] EKG showed no changes compared to one in Nov 2020. ED was offered patient wanted to wait. CXR was negative. While at PCP's office patient collapsed with seizure like activity then became unresponsive for a couple of minutes. When she regain consciousness she was dizzy. Patient sent to ED.   In ED her CBC, BMET, Troponin were unremarkable. . EKG normal. She was not orthostatic.  Neurology has evaluated. Seizure not suspected but EEG obtained and results pending.  CTA head and neck negative for posterior circulation disease.  Echocardiogram results pending. Neurology suspects benign paroxysmal positional vertigo secondary to fall and mild head injury. Started on baby asa for stroke prophylaxis and PT for vestibular therapy.  Regarding dysphagia, patient says it started a  week ago. She noticed a couple of hours after eating that she had discomfort in chest, felt like food didn't "digest". Since then she has poorly tolerated anything other than soft food and liquids. Anything else seems to get hung up in her esophagus and causes discomfort. She has not regurgitated anything. This has never happened to her before. She han't had any recent antibiotics, does not use steroid based inhalers. Evaluated by SLP but swallowing test not performed as patient felt like she still has food lodged in her chest.    PREVIOUS ENDOSCOPIC EVALUATIONS / PERTINENT STUDIES    Nov 2018 polyp surveillance colonoscopy  -One 2 mm polyp in the ascending colon, removed with a cold biopsy forceps. Resected and retrieved. - Melanosis in the colon. - The examination was otherwise normal on direct and retroflexion views.  Path = tubular adenoma  Past Medical History:  Diagnosis Date  . Anxiety   . Back pain    related to spinal stenosis and disc problem, radiates down left buttocks to leg., weakness occ.  [name] Dyspnea   . Headache   . Hyperlipidemia   . Hypertension   . Lumbar  post-laminectomy syndrome   . PONV (postoperative nausea and vomiting)   . Vaginal foreign object    "Uses Femring"    Past Surgical History:  Procedure Laterality Date  . ABDOMINAL HYSTERECTOMY    . CARDIAC CATHETERIZATION N/A 04/18/2015   Procedure: Left Heart Cath and Coronary Angiography;  Surgeon: [name], MD;  Location: [facility];  Service: Cardiovascular;  Laterality: N/A;  . COLONOSCOPY W/ BIOPSIES AND POLYPECTOMY    . FOOT SURGERY Bilateral    [place] "bunion,bone spur, tendon" (1) -6'16, (1)-10'16  . IR EPIDUROGRAPHY  07/21/2018  . LUMBAR LAMINECTOMY/DECOMPRESSION MICRODISCECTOMY Bilateral 12/28/2015   Procedure: MICRO LUMBAR DECOMPRESSION L4 - L5 BILATERALLY;  Surgeon: [name], MD;  Location: [facility];  Service: Orthopedics;  Laterality: Bilateral;  . LUMBAR LAMINECTOMY/DECOMPRESSION MICRODISCECTOMY Bilateral 03/04/2018   Procedure: Revision of Microlumbar Decompression Bilateral Lumbar Four-Five;  Surgeon: [name], MD;  Location: [place];  Service: Orthopedics;  Laterality: Bilateral;  90 mins  . SPINAL CORD STIMULATOR INSERTION N/A 09/28/2019   Procedure: THORACIC SPINAL CORD STIMULATOR INSERTION;  Surgeon: Melina Schools, MD;  Location: [place];  Service: Orthopedics;  Laterality: N/A;  2.5 hrs  . TUBAL LIGATION    . WISDOM TOOTH EXTRACTION    . WOUND EXPLORATION N/A 03/04/2018   Procedure: EXPLORATION OF LUMBAR DECOMPRESSION WOUND;  Surgeon: [name], MD;  Location: [facility];  Service: Orthopedics;  Laterality: N/A;    Prior to Admission medications   Medication Sig Start Date End Date Taking? Authorizing Provider  ALPRAZolam (XANAX) 0.25 MG tablet TAKE 1 TABLET BY MOUTH ONCE DAILY AS NEEDED FOR ANXIETY OR SLEEP Patient taking differently: Take 0.25 mg by mouth daily as needed for anxiety or sleep. 12/13/19  Yes [name], [name], MD  atorvastatin (LIPITOR) 20 MG tablet Take 1 tablet (20 mg total) by mouth daily. 04/22/20  Yes [name], [name], MD   carvedilol (COREG) 6.25 MG tablet TAKE 1 TABLET BY MOUTH TWICE DAILY (NEED APPT) Patient taking differently: Take 6.25 mg by mouth 2 (two) times daily with a meal. 11/23/20  Yes [name], [name], MD  cholecalciferol (VITAMIN D3) 25 MCG (1000 UNIT) tablet Take 1,000 Units by mouth daily.   Yes [provider]  diphenhydrAMINE (BENADRYL) 25 MG tablet Take 25 mg by mouth daily as needed for allergies.  Yes [provider]  DULoxetine (CYMBALTA) 30 MG capsule Take 1 capsule (30 mg total) by mouth daily. 08/15/20  Yes Meredith Staggers, MD  gabapentin (NEURONTIN) 400 MG capsule Take 400 mg by mouth 3 (three) times daily. 07/09/20  Yes [provider]  methocarbamol (ROBAXIN) 500 MG tablet Take 500 mg by mouth every 8 (eight) hours as needed for muscle spasms.   Yes [provider]  omeprazole (PRILOSEC) 40 MG capsule Take 1 capsule (40 mg total) by mouth daily. 12/14/20  Yes [name], [name], MD  traMADol (ULTRAM) 50 MG tablet Take 50 mg by mouth every 6 (six) hours as needed for moderate pain. Dr. Nelva Bush prescribing   Yes [provider]  polyethylene glycol (MIRALAX / GLYCOLAX) packet Take 17 g by mouth daily. Patient not taking: No sig reported 03/20/18   Bary Leriche, PA-C    Current Facility-Administered Medications  Medication Dose Route Frequency Provider Last Rate Last Admin  . 0.9 %  sodium chloride infusion  250 mL Intravenous PRN Bonnell Public Tublu, MD      . acetaminophen (TYLENOL) tablet 650 mg  650 mg Oral Q6H PRN Bonnell Public Tublu, MD   650 mg at 12/15/20 0748   Or  . acetaminophen (TYLENOL) suppository 650 mg  650 mg Rectal Q6H PRN Vashti Hey, MD      . ALPRAZolam Duanne Moron) tablet 0.25 mg  0.25 mg Oral Daily PRN Bonnell Public Tublu, MD   0.25 mg at 12/14/20 2046  . atorvastatin (LIPITOR) tablet 20 mg  20 mg Oral Daily Bonnell Public Tublu, MD   20 mg at 12/15/20 1035  . carvedilol (COREG) tablet 6.25 mg   6.25 mg Oral BID WC Bonnell Public Tublu, MD   6.25 mg at 12/15/20 0748  . DULoxetine (CYMBALTA) DR capsule 30 mg  30 mg Oral Daily Bonnell Public Tublu, MD   30 mg at 12/15/20 1035  . gabapentin (NEURONTIN) capsule 400 mg  400 mg Oral TID Bonnell Public Tublu, MD   400 mg at 12/15/20 1034  . methocarbamol (ROBAXIN) tablet 500 mg  500 mg Oral Q8H PRN Bonnell Public Tublu, MD      . pantoprazole (PROTONIX) EC tablet 80 mg  80 mg Oral Daily Bonnell Public Tublu, MD   80 mg at 12/15/20 1035  . sodium chloride flush (NS) 0.9 % injection 3 mL  3 mL Intravenous Q12H Bonnell Public Tublu, MD   3 mL at 12/15/20 1038  . sodium chloride flush (NS) 0.9 % injection 3 mL  3 mL Intravenous PRN Vashti Hey, MD   3 mL at 12/14/20 2134  . traMADol (ULTRAM) tablet 50 mg  50 mg Oral Q6H PRN Vashti Hey, MD   50 mg at 12/14/20 2307    Allergies as of 12/14/2020 - Review Complete 12/14/2020  Allergen Reaction Noted  . Cephalosporins Anaphylaxis 11/13/2017  . Penicillins Anaphylaxis and Hives 12/09/2012  . Anesthetics, amide Nausea And Vomiting 12/18/2015  . Betadine [povidone iodine] Other (See Comments) 09/28/2019  . Peach [prunus persica] Hives 08/10/2017  . Claritin [loratadine]  03/28/2020  . Latex Hives, Itching, and Rash 07/09/2017    Family History  Problem Relation Age of Onset  . Heart attack Mother   . Lung cancer Father   . Cancer Father   . Pancreatic cancer Sister   . Breast cancer Sister 67  . Throat cancer Brother   . Multiple myeloma Sister   . Breast cancer Sister  diagnosed in her 41's  . Heart attack Sister   . Stomach cancer Cousin   . Colon cancer Neg Hx     Social History   Socioeconomic History  . Marital status: Married    Spouse name: Not on file  . Number of children: Not on file  . Years of education: Not on file  . Highest education level: Not on file  Occupational History  . Not on file  Tobacco Use   . Smoking status: Never Smoker  . Smokeless tobacco: Never Used  Vaping Use  . Vaping Use: Never used  Substance and Sexual Activity  . Alcohol use: No  . Drug use: No  . Sexual activity: Yes    Partners: Male    Birth control/protection: Post-menopausal  Other Topics Concern  . Not on file  Social History Narrative  . Not on file   Social Determinants of Health   Financial Resource Strain: Not on file  Food Insecurity: Not on file  Transportation Needs: Not on file  Physical Activity: Not on file  Stress: Not on file  Social Connections: Not on file  Intimate Partner Violence: Not on file    Review of Systems: All systems reviewed and negative except where noted in HPI.  OBJECTIVE:    Physical Exam: Vital signs in last 24 hours: Temp:  [97.6 F (36.4 C)-98.8 F (37.1 C)] 97.6 F (36.4 C) (02/19 0722) Pulse Rate:  [69-94] 72 (02/19 0722) Resp:  [12-25] 13 (02/19 0722) BP: (116-182)/(58-141) 130/88 (02/19 0722) SpO2:  [96 %-100 %] 100 % (02/19 0722) Weight:  [76.2 kg-80.1 kg] 80.1 kg (02/18 1922)   General:   Alert  female in NAD Psych:  Pleasant, cooperative. Normal mood and affect. Eyes:  Pupils equal, sclera clear, no icterus.   Conjunctiva pink. Ears:  Normal auditory acuity. Nose:  No deformity, discharge,  or lesions. Neck:  Supple; no masses Lungs:  Clear throughout to auscultation.   No wheezes, crackles, or rhonchi.  Heart:  Regular rate and rhythm,  no lower extremity edema Abdomen:  Soft, non-distended, nontender, BS active, no palp mass   Rectal:  Deferred  Msk:  Symmetrical without gross deformities. . Neurologic:  Alert and  oriented x4;  grossly normal neurologically. Tremors in RUE Skin:  Intact without significant lesions or rashes.  Filed Weights   12/14/20 1435 12/14/20 1922  Weight: 76.2 kg 80.1 kg     Scheduled inpatient medications . atorvastatin  20 mg Oral Daily  . carvedilol  6.25 mg Oral BID WC  . DULoxetine  30 mg Oral  Daily  . gabapentin  400 mg Oral TID  . pantoprazole  80 mg Oral Daily  . sodium chloride flush  3 mL Intravenous Q12H      Intake/Output from previous day: 02/18 0701 - 02/19 0700 In: 1005 [I.V.:5; IV Piggyback:1000] Out: 850 [Urine:850] Intake/Output this shift: No intake/output data recorded.   Lab Results: Recent Labs    12/14/20 1010 12/14/20 1230 12/15/20 0051  WBC 3.9* 4.3 5.7  HGB 12.3 12.6 11.7*  HCT 37.4 39.2 35.4*  PLT 264.0 284 255   BMET Recent Labs    12/14/20 1010 12/14/20 1230 12/15/20 0051  NA 142 139 138  K 4.5 3.9 3.7  CL 105 103 103  CO2 33* 28 23  GLUCOSE 90 96 94  BUN $Re'12 9 10  'jEo$ CREATININE 1.07 0.99 0.91  CALCIUM 9.4 9.0 8.5*   LFT No results for input(s): PROT, ALBUMIN, AST,  ALT, ALKPHOS, BILITOT, BILIDIR, IBILI in the last 72 hours. PT/INR No results for input(s): LABPROT, INR in the last 72 hours. Hepatitis Panel No results for input(s): HEPBSAG, HCVAB, HEPAIGM, HEPBIGM in the last 72 hours.   . CBC Latest Ref Rng & Units 12/15/2020 12/14/2020 12/14/2020  WBC 4.0 - 10.5 K/uL 5.7 4.3 3.9(L)  Hemoglobin 12.0 - 15.0 g/dL 11.7(L) 12.6 12.3  Hematocrit 36.0 - 46.0 % 35.4(L) 39.2 37.4  Platelets 150 - 400 K/uL 255 284 264.0    . CMP Latest Ref Rng & Units 12/15/2020 12/14/2020 12/14/2020  Glucose 70 - 99 mg/dL 94 96 90  BUN 6 - 20 mg/dL $Remove'10 9 12  'msHNipX$ Creatinine 0.44 - 1.00 mg/dL 0.91 0.99 1.07  Sodium 135 - 145 mmol/L 138 139 142  Potassium 3.5 - 5.1 mmol/L 3.7 3.9 4.5  Chloride 98 - 111 mmol/L 103 103 105  CO2 22 - 32 mmol/L 23 28 33(H)  Calcium 8.9 - 10.3 mg/dL 8.5(L) 9.0 9.4  Total Protein 6.0 - 8.3 g/dL - - -  Total Bilirubin 0.2 - 1.2 mg/dL - - -  Alkaline Phos 39 - 117 U/L - - -  AST 0 - 37 U/L - - -  ALT 0 - 35 U/L - - -   Studies/Results: CT ANGIO HEAD W OR WO CONTRAST  Result Date: 12/14/2020 CLINICAL DATA:  Recurrent syncopal episode. EXAM: CT ANGIOGRAPHY HEAD AND NECK TECHNIQUE: Multidetector CT imaging of the head and neck  was performed using the standard protocol during bolus administration of intravenous contrast. Multiplanar CT image reconstructions and MIPs were obtained to evaluate the vascular anatomy. Carotid stenosis measurements (when applicable) are obtained utilizing NASCET criteria, using the distal internal carotid diameter as the denominator. CONTRAST:  65mL OMNIPAQUE IOHEXOL 350 MG/ML SOLN COMPARISON:  None. FINDINGS: CT HEAD FINDINGS Brain: The brain shows a normal appearance without evidence of malformation, atrophy, old or acute small or large vessel infarction, mass lesion, hemorrhage, hydrocephalus or extra-axial collection. Vascular: No hyperdense vessel. No evidence of atherosclerotic calcification. Skull: Normal.  No traumatic finding.  No focal bone lesion. Sinuses/Orbits: Sinuses are clear. Orbits appear normal. Mastoids are clear. Other: None significant CTA NECK FINDINGS Aortic arch: Minimal aortic atherosclerosis. Branching pattern is normal without origin stenosis. Right carotid system: Common carotid artery widely patent to bifurcation. Carotid bifurcation shows minimal atherosclerotic plaque but no stenosis. Cervical ICA widely patent. Left carotid system: Common carotid artery widely patent to the bifurcation. Carotid bifurcation shows mild atherosclerotic plaque but no stenosis. Cervical ICA widely patent. Vertebral arteries: Both vertebral arteries are widely patent at their origin and through the cervical region to the foramen magnum. Skeleton: Ordinary cervical spondylosis. Other neck: No mass or lymphadenopathy. Upper chest: Normal Review of the MIP images confirms the above findings CTA HEAD FINDINGS Anterior circulation: Both internal carotid arteries are widely patent through the skull base and siphon regions. The anterior and middle cerebral vessels are patent without stenosis or occlusion, aneurysm or vascular malformation. Posterior circulation: Both vertebral arteries widely patent to the  basilar. No basilar stenosis. Posterior circulation branch vessels are normal. Venous sinuses: Patent and normal. Anatomic variants: None significant. Review of the MIP images confirms the above findings IMPRESSION: Minimal aortic atherosclerosis. Minimal atherosclerotic change at both carotid bifurcations but no stenosis. No intracranial large or medium vessel occlusion or correctable proximal stenosis. Aortic Atherosclerosis (ICD10-I70.0). Electronically Signed   By: Nelson Chimes M.D.   On: 12/14/2020 22:32   DG Chest 2 View  Result  Date: 12/14/2020 CLINICAL DATA:  Mid chest pain for 5 days radiating to LEFT axilla, no history of trauma EXAM: CHEST - 2 VIEW COMPARISON:  03/16/2020 FINDINGS: Intraspinal stimulator leads again identified. Normal heart size, mediastinal contours, and pulmonary vascularity. Lungs clear. No pulmonary infiltrate, pleural effusion, or pneumothorax. Osseous structures unremarkable. IMPRESSION: No acute abnormalities. Electronically Signed   By: Lavonia Dana M.D.   On: 12/14/2020 18:51   CT ANGIO NECK W OR WO CONTRAST  Result Date: 12/14/2020 CLINICAL DATA:  Recurrent syncopal episode. EXAM: CT ANGIOGRAPHY HEAD AND NECK TECHNIQUE: Multidetector CT imaging of the head and neck was performed using the standard protocol during bolus administration of intravenous contrast. Multiplanar CT image reconstructions and MIPs were obtained to evaluate the vascular anatomy. Carotid stenosis measurements (when applicable) are obtained utilizing NASCET criteria, using the distal internal carotid diameter as the denominator. CONTRAST:  82mL OMNIPAQUE IOHEXOL 350 MG/ML SOLN COMPARISON:  None. FINDINGS: CT HEAD FINDINGS Brain: The brain shows a normal appearance without evidence of malformation, atrophy, old or acute small or large vessel infarction, mass lesion, hemorrhage, hydrocephalus or extra-axial collection. Vascular: No hyperdense vessel. No evidence of atherosclerotic calcification. Skull:  Normal.  No traumatic finding.  No focal bone lesion. Sinuses/Orbits: Sinuses are clear. Orbits appear normal. Mastoids are clear. Other: None significant CTA NECK FINDINGS Aortic arch: Minimal aortic atherosclerosis. Branching pattern is normal without origin stenosis. Right carotid system: Common carotid artery widely patent to bifurcation. Carotid bifurcation shows minimal atherosclerotic plaque but no stenosis. Cervical ICA widely patent. Left carotid system: Common carotid artery widely patent to the bifurcation. Carotid bifurcation shows mild atherosclerotic plaque but no stenosis. Cervical ICA widely patent. Vertebral arteries: Both vertebral arteries are widely patent at their origin and through the cervical region to the foramen magnum. Skeleton: Ordinary cervical spondylosis. Other neck: No mass or lymphadenopathy. Upper chest: Normal Review of the MIP images confirms the above findings CTA HEAD FINDINGS Anterior circulation: Both internal carotid arteries are widely patent through the skull base and siphon regions. The anterior and middle cerebral vessels are patent without stenosis or occlusion, aneurysm or vascular malformation. Posterior circulation: Both vertebral arteries widely patent to the basilar. No basilar stenosis. Posterior circulation branch vessels are normal. Venous sinuses: Patent and normal. Anatomic variants: None significant. Review of the MIP images confirms the above findings IMPRESSION: Minimal aortic atherosclerosis. Minimal atherosclerotic change at both carotid bifurcations but no stenosis. No intracranial large or medium vessel occlusion or correctable proximal stenosis. Aortic Atherosclerosis (ICD10-I70.0). Electronically Signed   By: Nelson Chimes M.D.   On: 12/14/2020 22:32    Principal Problem:   Postural dizziness with presyncope Active Problems:   Chest pain   Essential hypertension   Anxiety disorder, unspecified   Chronic pain   Lumbar post-laminectomy  syndrome    Tye Savoy, NP-C @  12/15/2020, 10:50 AM

## 2020-12-16 ENCOUNTER — Encounter (HOSPITAL_COMMUNITY): Payer: Self-pay | Admitting: Internal Medicine

## 2020-12-16 ENCOUNTER — Inpatient Hospital Stay (HOSPITAL_COMMUNITY): Payer: PPO | Admitting: Certified Registered Nurse Anesthetist

## 2020-12-16 ENCOUNTER — Other Ambulatory Visit: Payer: Self-pay | Admitting: Physician Assistant

## 2020-12-16 ENCOUNTER — Encounter (HOSPITAL_COMMUNITY)
Admission: EM | Disposition: A | Discharge status: Home Health | Payer: Self-pay | Source: Home / Self Care | Attending: Internal Medicine

## 2020-12-16 ENCOUNTER — Other Ambulatory Visit: Payer: Self-pay

## 2020-12-16 DIAGNOSIS — R55 Syncope and collapse: Secondary | ICD-10-CM

## 2020-12-16 DIAGNOSIS — F419 Anxiety disorder, unspecified: Secondary | ICD-10-CM

## 2020-12-16 DIAGNOSIS — R0789 Other chest pain: Secondary | ICD-10-CM | POA: Diagnosis not present

## 2020-12-16 DIAGNOSIS — R42 Dizziness and giddiness: Secondary | ICD-10-CM | POA: Diagnosis not present

## 2020-12-16 DIAGNOSIS — R1310 Dysphagia, unspecified: Secondary | ICD-10-CM

## 2020-12-16 HISTORY — PX: SAVORY DILATION: SHX5439

## 2020-12-16 HISTORY — PX: ESOPHAGOGASTRODUODENOSCOPY (EGD) WITH PROPOFOL: SHX5813

## 2020-12-16 HISTORY — PX: BIOPSY: SHX5522

## 2020-12-16 LAB — BASIC METABOLIC PANEL
Anion gap: 7 (ref 5–15)
BUN: 7 mg/dL (ref 6–20)
CO2: 28 mmol/L (ref 22–32)
Calcium: 8.7 mg/dL — ABNORMAL LOW (ref 8.9–10.3)
Chloride: 101 mmol/L (ref 98–111)
Creatinine, Ser: 0.94 mg/dL (ref 0.44–1.00)
GFR, Estimated: >60 mL/min (ref >60)
Glucose, Bld: 92 mg/dL (ref 70–99)
Potassium: 3.8 mmol/L (ref 3.5–5.1)
Sodium: 136 mmol/L (ref 135–145)

## 2020-12-16 LAB — CBC
HCT: 35.8 % — ABNORMAL LOW (ref 36.0–46.0)
Hemoglobin: 11.8 g/dL — ABNORMAL LOW (ref 12.0–15.0)
MCH: 29.6 pg (ref 26.0–34.0)
MCHC: 33 g/dL (ref 30.0–36.0)
MCV: 89.9 fL (ref 80.0–100.0)
Platelets: 261 10*3/uL (ref 150–400)
RBC: 3.98 MIL/uL (ref 3.87–5.11)
RDW: 13.1 % (ref 11.5–15.5)
WBC: 4.8 10*3/uL (ref 4.0–10.5)
nRBC: 0 % (ref 0.0–0.2)

## 2020-12-16 SURGERY — ESOPHAGOGASTRODUODENOSCOPY (EGD) WITH PROPOFOL
Anesthesia: Monitor Anesthesia Care

## 2020-12-16 MED ORDER — PROPOFOL 10 MG/ML IV BOLUS
INTRAVENOUS | Status: DC | PRN
  Administered 2020-12-16: 100 ug/kg/min via INTRAVENOUS

## 2020-12-16 MED ORDER — CARVEDILOL 12.5 MG PO TABS
12.5000 mg | ORAL_TABLET | Freq: Two times a day (BID) | ORAL | Status: DC
  Administered 2020-12-16 – 2020-12-20 (×8): 12.5 mg via ORAL
  Filled 2020-12-16 (×8): qty 1

## 2020-12-16 MED ORDER — PHENYLEPHRINE HCL-NACL 10-0.9 MG/250ML-% IV SOLN
INTRAVENOUS | Status: DC | PRN
  Administered 2020-12-16: 10 ug/min via INTRAVENOUS

## 2020-12-16 MED ORDER — PHENOL 1.4 % MT LIQD: 1.0000 | OROMUCOSAL | Status: DC | PRN

## 2020-12-16 MED ORDER — LIDOCAINE VISCOUS HCL 2 % MT SOLN
15.0000 mL | Freq: Three times a day (TID) | OROMUCOSAL | Status: DC | PRN
  Filled 2020-12-16: qty 15

## 2020-12-16 MED ORDER — LACTATED RINGERS IV SOLN: INTRAVENOUS | Status: DC | PRN

## 2020-12-16 MED ORDER — ACETAMINOPHEN 500 MG PO TABS
ORAL_TABLET | ORAL | Status: AC
  Filled 2020-12-16: qty 1

## 2020-12-16 SURGICAL SUPPLY — 15 items

## 2020-12-16 NOTE — Progress Notes (Signed)
SLP Cancellation Note  Patient Details Name: [name] MRN: [id number] DOB: 1961-09-27   Cancelled treatment:       Reason Eval/Treat Not Completed: Medical issues which prohibited therapy. Per review of chart, pt is NPO pending plan for EGD today. Will f/u for swallowing evaluation after she is able to resume POs.    Osie Bond., M.A. [facility] Pager [phone] Office [phone]  12/16/2020, 7:24 AM

## 2020-12-16 NOTE — Transfer of Care (Signed)
Immediate Anesthesia Transfer of Care Note  Patient: [name]  Procedure(s) Performed: ESOPHAGOGASTRODUODENOSCOPY (EGD) WITH PROPOFOL (N/A ) BIOPSY SAVORY DILATION (N/A )  Patient Location: Endo  Anesthesia Type:General TIVA  Level of Consciousness: awake, alert , oriented and patient cooperative  Airway & Oxygen Therapy: Patient Spontanous Breathing  Post-op Assessment: Report given to RN and Post -op Vital signs reviewed and stable  Post vital signs: Reviewed and stable  Last Vitals:  Vitals Value Taken Time  BP 151/78 12/16/20 1046  Temp 36.7 C 12/16/20 1046  Pulse 69 12/16/20 1050  Resp 11 12/16/20 1050  SpO2 99 % 12/16/20 1050  Vitals shown include unvalidated device data.  Last Pain:  Vitals:   12/16/20 1046  TempSrc: Oral  PainSc: 9       Patients Stated Pain Goal: 0 (41/74/08 1448)  Complications: No complications documented.

## 2020-12-16 NOTE — Plan of Care (Signed)

## 2020-12-16 NOTE — Anesthesia Preprocedure Evaluation (Addendum)
Anesthesia Evaluation  Patient identified by MRN, date of birth, ID band Patient awake    Reviewed: Allergy & Precautions, H&P , NPO status , Patient's Chart, lab work & pertinent test results  History of Anesthesia Complications (+) PONV  Airway Mallampati: II  TM Distance: >3 FB Neck ROM: Full    Dental no notable dental hx. (+) Teeth Intact, Dental Advisory Given   Pulmonary neg pulmonary ROS,    Pulmonary exam normal breath sounds clear to auscultation       Cardiovascular hypertension, Pt. on medications and Pt. on home beta blockers  Rhythm:Regular Rate:Normal  LVH ?HOCM   Neuro/Psych  Headaches, Anxiety Depression    GI/Hepatic negative GI ROS, Neg liver ROS,   Endo/Other  negative endocrine ROS  Renal/GU negative Renal ROS  negative genitourinary   Musculoskeletal   Abdominal   Peds  Hematology  (+) Blood dyscrasia, anemia ,   Anesthesia Other Findings   Reproductive/Obstetrics negative OB ROS                            Anesthesia Physical Anesthesia Plan  ASA: III  Anesthesia Plan: MAC   Post-op Pain Management:    Induction: Intravenous  PONV Risk Score and Plan: 3 and Propofol infusion and Treatment may vary due to age or medical condition  Airway Management Planned: Nasal Cannula  Additional Equipment:   Intra-op Plan:   Post-operative Plan:   Informed Consent: I have reviewed the patients History and Physical, chart, labs and discussed the procedure including the risks, benefits and alternatives for the proposed anesthesia with the patient or authorized representative who has indicated his/her understanding and acceptance.     Dental advisory given  Plan Discussed with: CRNA  Anesthesia Plan Comments:        Anesthesia Quick Evaluation

## 2020-12-16 NOTE — Interval H&P Note (Signed)
History and Physical Interval Note:  12/16/2020 10:01 AM  [name] [name]  has presented today for surgery, with the diagnosis of dysphagia.  The various methods of treatment have been discussed with the patient and family. After consideration of risks, benefits and other options for treatment, the patient has consented to  Procedure(s): ESOPHAGOGASTRODUODENOSCOPY (EGD) WITH PROPOFOL (N/A) as a surgical intervention.  The patient's history has been reviewed, patient examined, no change in status, stable for surgery.  I have reviewed the patient's chart and labs.  Questions were answered to the patient's satisfaction.     [name]. Fuller Plan

## 2020-12-16 NOTE — Consult Note (Signed)
Cardiology Consultation:   Patient ID: [name]; [id number]; 21-Sep-1961   Admit date: 12/14/2020 Date of Consult: 12/16/2020  Primary Care Provider: [name], [name], MD Primary Cardiologist: No primary care provider on file. new, saw Dr Terrence Dupont in 2016, but prefers our group now Primary Electrophysiologist:  None   Patient Profile:   [name] is a 60 y.o. female with a hx of dropfoot and chronic back issues related to spinal stenosis and disc problems, has a spinal stimulator, HTN, HLD, FH CAD,  who is being seen today for the evaluation of syncope and chest pain at the request of Dr. Cruzita Lederer.  History of Present Illness:   [name] has been having chest pain for approximately a week.  It is constant, but worse when she eats anything.  Her p.o. intake of solid food has been decreased because of this, but she says she has been drinking plenty of water.  She went to see her PCP and was sent for an x-ray.  She was standing when she became lightheaded and woke up on the floor.  She did not know she was going to pass out, but she felt lightheaded before it happened.  She has had episodes of being lightheaded occasionally, especially since the chest pain started.  She has not had any of the symptoms since she got to the hospital.  On arrival to the hospital, her orthostatic vital signs were negative with a blood pressure of 168/141 lying, then 182/101 sitting and 168/105 standing.  Heart rate went from 70 up to 86.  Because of her back and leg issues, she does not ambulate that much.  However, she has no history of exertional chest pain.  She had an EGD today and biopsies were taken.  However, there was no obvious reason for her dysphagia.  Her esophagus was dilated.  Post procedure, she is still having the chest pain and it may even be a little worse.  She has no history of sudden cardiac death in her family.  Her mother died of a heart attack, but she  was having chest pain for at least 24 hours and went to the hospital before she died.  Her sister died suddenly at home, but there was an autopsy which showed she also died of a heart attack.   Past Medical History:  Diagnosis Date  . Anxiety   . Back pain    related to spinal stenosis and disc problem, radiates down left buttocks to leg., weakness occ.  [name] Dyspnea   . Headache   . Hyperlipidemia   . Hypertension   . Lumbar post-laminectomy syndrome   . PONV (postoperative nausea and vomiting)   . Vaginal foreign object    "Uses Femring"    Past Surgical History:  Procedure Laterality Date  . ABDOMINAL HYSTERECTOMY    . CARDIAC CATHETERIZATION N/A 04/18/2015   Procedure: Left Heart Cath and Coronary Angiography;  Surgeon: [name], MD;  Location: [facility];  Service: Cardiovascular;  Laterality: N/A;  . COLONOSCOPY W/ BIOPSIES AND POLYPECTOMY    . FOOT SURGERY Bilateral    Waterproof "bunion,bone spur, tendon" (1) -6'16, (1)-10'16  . IR EPIDUROGRAPHY  07/21/2018  . LUMBAR LAMINECTOMY/DECOMPRESSION MICRODISCECTOMY Bilateral 12/28/2015   Procedure: MICRO LUMBAR DECOMPRESSION L4 - L5 BILATERALLY;  Surgeon: [name], MD;  Location: [facility];  Service: Orthopedics;  Laterality: Bilateral;  . LUMBAR LAMINECTOMY/DECOMPRESSION MICRODISCECTOMY Bilateral 03/04/2018   Procedure: Revision of Microlumbar Decompression Bilateral Lumbar Four-Five;  Surgeon: Jene Every, MD;  Location: [facility];  Service: Orthopedics;  Laterality: Bilateral;  90 mins  . SPINAL CORD STIMULATOR INSERTION N/A 09/28/2019   Procedure: THORACIC SPINAL CORD STIMULATOR INSERTION;  Surgeon: Venita Lick, MD;  Location: [facility];  Service: Orthopedics;  Laterality: N/A;  2.5 hrs  . TUBAL LIGATION    . WISDOM TOOTH EXTRACTION    . WOUND EXPLORATION N/A 03/04/2018   Procedure: EXPLORATION OF LUMBAR DECOMPRESSION WOUND;  Surgeon: Jene Every, MD;  Location: [facility];  Service: Orthopedics;  Laterality: N/A;      Prior to Admission medications   Medication Sig Start Date End Date Taking? Authorizing Provider  ALPRAZolam (XANAX) 0.25 MG tablet TAKE 1 TABLET BY MOUTH ONCE DAILY AS NEEDED FOR ANXIETY OR SLEEP Patient taking differently: Take 0.25 mg by mouth daily as needed for anxiety or sleep. 12/13/19  Yes [place], [name], MD  atorvastatin (LIPITOR) 20 MG tablet Take 1 tablet (20 mg total) by mouth daily. 04/22/20  Yes [place], [name], MD  carvedilol (COREG) 6.25 MG tablet TAKE 1 TABLET BY MOUTH TWICE DAILY (NEED APPT) Patient taking differently: Take 6.25 mg by mouth 2 (two) times daily with a meal. 11/23/20  Yes [place], [name], MD  cholecalciferol (VITAMIN D3) 25 MCG (1000 UNIT) tablet Take 1,000 Units by mouth daily.   Yes [provider]  diphenhydrAMINE (BENADRYL) 25 MG tablet Take 25 mg by mouth daily as needed for allergies.   Yes [provider]  DULoxetine (CYMBALTA) 30 MG capsule Take 1 capsule (30 mg total) by mouth daily. 08/15/20  Yes Ranelle Oyster, MD  gabapentin (NEURONTIN) 400 MG capsule Take 400 mg by mouth 3 (three) times daily. 07/09/20  Yes [provider]  methocarbamol (ROBAXIN) 500 MG tablet Take 500 mg by mouth every 8 (eight) hours as needed for muscle spasms.   Yes [provider]  omeprazole (PRILOSEC) 40 MG capsule Take 1 capsule (40 mg total) by mouth daily. 12/14/20  Yes [place], [name], MD  traMADol (ULTRAM) 50 MG tablet Take 50 mg by mouth every 6 (six) hours as needed for moderate pain. Dr. Ethelene Hal prescribing   Yes [provider]  polyethylene glycol (MIRALAX / GLYCOLAX) packet Take 17 g by mouth daily. Patient not taking: No sig reported 03/20/18   Jacquelynn Cree, PA-C    Inpatient Medications: Scheduled Meds: . atorvastatin  20 mg Oral Daily  . carvedilol  6.25 mg Oral BID WC  . DULoxetine  30 mg Oral Daily  . gabapentin  400 mg Oral TID  . pantoprazole  40 mg Oral BID AC  . sodium chloride flush  3 mL Intravenous Q12H   . sucralfate  1 g Oral TID WC & HS   Continuous Infusions: . sodium chloride     PRN Meds: sodium chloride, acetaminophen **OR** acetaminophen, ALPRAZolam, lidocaine, methocarbamol, sodium chloride flush, traMADol  Allergies:    Allergies  Allergen Reactions  . Cephalosporins Anaphylaxis  . Penicillins Anaphylaxis and Hives    Did it involve swelling of the face/tongue/throat, SOB, or low BP? Yes Did it involve sudden or severe rash/hives, skin peeling, or any reaction on the inside of your mouth or nose? Yes Did you need to seek medical attention at a hospital or doctor's office? Yes When did it last happen?10+ years If all above answers are "NO", may proceed with cephalosporin use.    . Anesthetics, Amide Nausea And Vomiting    Does not know name of  it. States they put it on record foot center.   . Betadine [Povidone Iodine] Other (See Comments)    "Skin Burn" caused scar on Left buttock  . Peach [Prunus Persica] Hives  . Claritin [Loratadine]     Rash, pruritis   . Latex Hives, Itching and Rash    Social History:   Social History   Socioeconomic History  . Marital status: Married    Spouse name: Not on file  . Number of children: Not on file  . Years of education: Not on file  . Highest education level: Not on file  Occupational History  . Not on file  Tobacco Use  . Smoking status: Never Smoker  . Smokeless tobacco: Never Used  Vaping Use  . Vaping Use: Never used  Substance and Sexual Activity  . Alcohol use: No  . Drug use: No  . Sexual activity: Yes    Partners: Male    Birth control/protection: Post-menopausal  Other Topics Concern  . Not on file  Social History Narrative  . Not on file   Social Determinants of Health   Financial Resource Strain: Not on file  Food Insecurity: Not on file  Transportation Needs: Not on file  Physical Activity: Not on file  Stress: Not on file  Social Connections: Not on file  Intimate Partner Violence:  Not on file    Family History:   Family History  Problem Relation Age of Onset  . Heart attack Mother   . Lung cancer Father   . Cancer Father   . Pancreatic cancer Sister   . Breast cancer Sister 52  . Throat cancer Brother   . Multiple myeloma Sister   . Breast cancer Sister        diagnosed in her 42's  . Heart attack Sister   . Stomach cancer Cousin   . Colon cancer Neg Hx    Family Status:  Family Status  Relation Name Status  . Mother  Deceased  . Father  Deceased  . Sister Fraser Din Deceased  . Brother Birdena Crandall Deceased  . Sister Peter Congo Deceased  . Brother Deloyce Deceased  . Cousin  (Not Specified)  . Brother Tourist information centre manager Deceased  . Neg Hx  (Not Specified)    ROS:  Please see the history of present illness.  All other ROS reviewed and negative.     Physical Exam/Data:   Vitals:   12/16/20 0748 12/16/20 0929 12/16/20 1046 12/16/20 1055  BP: (!) 147/89 (!) 183/77 (!) 151/78 (!) 153/83  Pulse:  66 71 73  Resp: $Remo'10 11 13 13  'FlLGw$ Temp: (!) 97.5 F (36.4 C) 98.2 F (36.8 C) 98.1 F (36.7 C)   TempSrc: Oral Oral Oral   SpO2:  96% 98% 97%  Weight:      Height:        Intake/Output Summary (Last 24 hours) at 12/16/2020 1221 Last data filed at 12/16/2020 1125 Gross per 24 hour  Intake 103 ml  Output -  Net 103 ml    Last 3 Weights 12/14/2020 12/14/2020 08/20/2020  Weight (lbs) 176 lb 9.4 oz 168 lb (No Data)  Weight (kg) 80.1 kg 76.204 kg (No Data)     Body mass index is 29.39 kg/m.   General:  Well nourished, well developed, female in no acute distress HEENT: normal Lymph: no adenopathy Neck: JVD -not elevated Endocrine:  No thryomegaly Vascular: No carotid bruits; 4/4 extremity pulses 2+  Cardiac:  normal S1, S2; RRR; no murmur Lungs:  clear bilaterally, no wheezing, rhonchi or rales  Abd: soft, nontender, no hepatomegaly  Ext: no edema Musculoskeletal:  No deformities, BUE and BLE strength at baseline Skin: warm and dry  Neuro:  CNs 2-12 intact, no focal  abnormalities noted Psych:  Normal affect   EKG:  The EKG was personally reviewed and demonstrates: Sinus rhythm with first-degree AV block, heart rate 72, PR interval 216 ms, no acute ischemic changes Telemetry:  Telemetry was personally reviewed and demonstrates: Sinus rhythm   CV studies:   ECHO: 12/15/2020 1. Left ventricular ejection fraction, by estimation, is 65 to 70%. The  left ventricle has normal function. The left ventricle has no regional  wall motion abnormalities. There is severe asymmetric left ventricular  hypertrophy of the basal-septal  segment. The IVSd is 1.9 cm - no LVOT obstruction is noted at rest.  Valsalva not performed. Findings suggest possible hypertrophic  cardiomyopathy. Left ventricular diastolic parameters are consistent with  Grade I diastolic dysfunction (impaired  relaxation).  2. Right ventricular systolic function is normal. The right ventricular  size is normal. There is normal pulmonary artery systolic pressure.  3. The aortic valve is tricuspid. Aortic valve regurgitation is not  visualized.  4. The inferior vena cava is normal in size with greater than 50%  respiratory variability, suggesting right atrial pressure of 3 mmHg.  5. The mitral valve is grossly normal. Trivial mitral valve  regurgitation.   Conclusion(s)/Recommendation(s): Findings consistent with possible  hypertrophic cardiomyopathy. Consider cMRI to evalute further.   CATH: 04/18/2015 (Dr. Terrence Dupont) Left Anterior Descending  First Diagonal Branch  The vessel is small in size.  Second Diagonal Branch  The vessel is small in size.  Third Diagonal Branch  The vessel is small in size.  Left Circumflex  The vessel is small .  First Obtuse Marginal Branch  The vessel is small in size.   No CAD noted in documentation or pictures   Laboratory Data:   Chemistry Recent Labs  Lab 12/14/20 1230 12/15/20 0051 12/16/20 0124  NA 139 138 136  K 3.9 3.7 3.8  CL 103 103  101  CO2 $Re'28 23 28  'SQY$ GLUCOSE 96 94 92  BUN $Re'9 10 7  'NPh$ CREATININE 0.99 0.91 0.94  CALCIUM 9.0 8.5* 8.7*  GFRNONAA >60 >60 >60  ANIONGAP $RemoveB'8 12 7    'BMUxdtEX$ Lab Results  Component Value Date   ALT 19 04/20/2020   AST 16 04/20/2020   ALKPHOS 70 04/20/2020   BILITOT 0.4 04/20/2020   Hematology Recent Labs  Lab 12/14/20 1230 12/15/20 0051 12/16/20 0124  WBC 4.3 5.7 4.8  RBC 4.28 3.93 3.98  HGB 12.6 11.7* 11.8*  HCT 39.2 35.4* 35.8*  MCV 91.6 90.1 89.9  MCH 29.4 29.8 29.6  MCHC 32.1 33.1 33.0  RDW 13.3 13.2 13.1  PLT 284 255 261   Cardiac Enzymes High Sensitivity Troponin:   Recent Labs  Lab 12/14/20 1230 12/14/20 1416 12/15/20 0051  TROPONINIHS $RemoveBefo'5 6 4      'JDLYXtrkhjk$ BNPNo results for input(s): BNP, PROBNP in the last 168 hours.  DDimer  Recent Labs  Lab 12/14/20 1430  DDIMER 0.41   TSH:  Lab Results  Component Value Date   TSH 3.111 12/14/2020   Lipids: Lab Results  Component Value Date   CHOL 198 04/20/2020   HDL 61.70 04/20/2020   LDLCALC 123 (H) 04/20/2020   TRIG 62.0 04/20/2020   CHOLHDL 3 04/20/2020   HgbA1c: Lab Results  Component Value Date   HGBA1C  5.5 03/06/2018   Magnesium:  Magnesium  Date Value Ref Range Status  07/16/2018 2.0 1.7 - 2.4 mg/dL Final    Comment:    Performed at [facility], [place] [address] [place], [place] [address]     Radiology/Studies:  EEG  Result Date: 12/15/2020 Lora Havens, MD     12/15/2020  3:55 PM Patient Name: Sadaf Przybysz MRN: [id number] Epilepsy Attending: Lora Havens Referring Physician/Provider: Clance Boll, NP Date: 12/15/2020 Duration: 24.29 mins Patient history: 60yo F with syncope, vertigo. EEG to evaluate for seizure Level of alertness: Awake, asleep AEDs during EEG study: GBP Technical aspects: This EEG study was done with scalp electrodes positioned according to the 10-20 International system of electrode placement. Electrical activity was acquired at a sampling rate of $Remov'500Hz'cgBWbO$  and  reviewed with a high frequency filter of $RemoveB'70Hz'tJDSvhvX$  and a low frequency filter of $RemoveB'1Hz'DxjfsjWB$ . EEG data were recorded continuously and digitally stored. Description: The posterior dominant rhythm consists of 9-10 Hz activity of moderate voltage (25-35 uV) seen predominantly in posterior head regions, symmetric and reactive to eye opening and eye closing. Sleep was characterized by vertex waves, sleep spindles (12 to 14 Hz), maximal frontocentral region. Hyperventilation did not show any EEG change. Hyperventilation and photic stimulation were not performed.   IMPRESSION: This study is within normal limits. No seizures or epileptiform discharges were seen throughout the recording. Priyanka Barbra Sarks   CT ANGIO HEAD W OR WO CONTRAST  Result Date: 12/14/2020 CLINICAL DATA:  Recurrent syncopal episode. EXAM: CT ANGIOGRAPHY HEAD AND NECK TECHNIQUE: Multidetector CT imaging of the head and neck was performed using the standard protocol during bolus administration of intravenous contrast. Multiplanar CT image reconstructions and MIPs were obtained to evaluate the vascular anatomy. Carotid stenosis measurements (when applicable) are obtained utilizing NASCET criteria, using the distal internal carotid diameter as the denominator. CONTRAST:  34mL OMNIPAQUE IOHEXOL 350 MG/ML SOLN COMPARISON:  None. FINDINGS: CT HEAD FINDINGS Brain: The brain shows a normal appearance without evidence of malformation, atrophy, old or acute small or large vessel infarction, mass lesion, hemorrhage, hydrocephalus or extra-axial collection. Vascular: No hyperdense vessel. No evidence of atherosclerotic calcification. Skull: Normal.  No traumatic finding.  No focal bone lesion. Sinuses/Orbits: Sinuses are clear. Orbits appear normal. Mastoids are clear. Other: None significant CTA NECK FINDINGS Aortic arch: Minimal aortic atherosclerosis. Branching pattern is normal without origin stenosis. Right carotid system: Common carotid artery widely patent to  bifurcation. Carotid bifurcation shows minimal atherosclerotic plaque but no stenosis. Cervical ICA widely patent. Left carotid system: Common carotid artery widely patent to the bifurcation. Carotid bifurcation shows mild atherosclerotic plaque but no stenosis. Cervical ICA widely patent. Vertebral arteries: Both vertebral arteries are widely patent at their origin and through the cervical region to the foramen magnum. Skeleton: Ordinary cervical spondylosis. Other neck: No mass or lymphadenopathy. Upper chest: Normal Review of the MIP images confirms the above findings CTA HEAD FINDINGS Anterior circulation: Both internal carotid arteries are widely patent through the skull base and siphon regions. The anterior and middle cerebral vessels are patent without stenosis or occlusion, aneurysm or vascular malformation. Posterior circulation: Both vertebral arteries widely patent to the basilar. No basilar stenosis. Posterior circulation branch vessels are normal. Venous sinuses: Patent and normal. Anatomic variants: None significant. Review of the MIP images confirms the above findings IMPRESSION: Minimal aortic atherosclerosis. Minimal atherosclerotic change at both carotid bifurcations but no stenosis. No intracranial large or medium vessel occlusion or correctable proximal stenosis. Aortic  Atherosclerosis (ICD10-I70.0). Electronically Signed   By: Nelson Chimes M.D.   On: 12/14/2020 22:32   DG Chest 2 View  Result Date: 12/14/2020 CLINICAL DATA:  Mid chest pain for 5 days radiating to LEFT axilla, no history of trauma EXAM: CHEST - 2 VIEW COMPARISON:  03/16/2020 FINDINGS: Intraspinal stimulator leads again identified. Normal heart size, mediastinal contours, and pulmonary vascularity. Lungs clear. No pulmonary infiltrate, pleural effusion, or pneumothorax. Osseous structures unremarkable. IMPRESSION: No acute abnormalities. Electronically Signed   By: Lavonia Dana M.D.   On: 12/14/2020 18:51   CT ANGIO NECK W OR  WO CONTRAST  Result Date: 12/14/2020 CLINICAL DATA:  Recurrent syncopal episode. EXAM: CT ANGIOGRAPHY HEAD AND NECK TECHNIQUE: Multidetector CT imaging of the head and neck was performed using the standard protocol during bolus administration of intravenous contrast. Multiplanar CT image reconstructions and MIPs were obtained to evaluate the vascular anatomy. Carotid stenosis measurements (when applicable) are obtained utilizing NASCET criteria, using the distal internal carotid diameter as the denominator. CONTRAST:  76mL OMNIPAQUE IOHEXOL 350 MG/ML SOLN COMPARISON:  None. FINDINGS: CT HEAD FINDINGS Brain: The brain shows a normal appearance without evidence of malformation, atrophy, old or acute small or large vessel infarction, mass lesion, hemorrhage, hydrocephalus or extra-axial collection. Vascular: No hyperdense vessel. No evidence of atherosclerotic calcification. Skull: Normal.  No traumatic finding.  No focal bone lesion. Sinuses/Orbits: Sinuses are clear. Orbits appear normal. Mastoids are clear. Other: None significant CTA NECK FINDINGS Aortic arch: Minimal aortic atherosclerosis. Branching pattern is normal without origin stenosis. Right carotid system: Common carotid artery widely patent to bifurcation. Carotid bifurcation shows minimal atherosclerotic plaque but no stenosis. Cervical ICA widely patent. Left carotid system: Common carotid artery widely patent to the bifurcation. Carotid bifurcation shows mild atherosclerotic plaque but no stenosis. Cervical ICA widely patent. Vertebral arteries: Both vertebral arteries are widely patent at their origin and through the cervical region to the foramen magnum. Skeleton: Ordinary cervical spondylosis. Other neck: No mass or lymphadenopathy. Upper chest: Normal Review of the MIP images confirms the above findings CTA HEAD FINDINGS Anterior circulation: Both internal carotid arteries are widely patent through the skull base and siphon regions. The anterior  and middle cerebral vessels are patent without stenosis or occlusion, aneurysm or vascular malformation. Posterior circulation: Both vertebral arteries widely patent to the basilar. No basilar stenosis. Posterior circulation branch vessels are normal. Venous sinuses: Patent and normal. Anatomic variants: None significant. Review of the MIP images confirms the above findings IMPRESSION: Minimal aortic atherosclerosis. Minimal atherosclerotic change at both carotid bifurcations but no stenosis. No intracranial large or medium vessel occlusion or correctable proximal stenosis. Aortic Atherosclerosis (ICD10-I70.0). Electronically Signed   By: Nelson Chimes M.D.   On: 12/14/2020 22:32   ECHOCARDIOGRAM COMPLETE  Result Date: 12/15/2020    ECHOCARDIOGRAM REPORT   Patient Name:   DANIAL SISLEY Date of Exam: 12/15/2020 Medical Rec #:  [id number]                Height:       65.0 in Accession #:    [id number]               Weight:       176.6 lb Date of Birth:  [birth year]               BSA:          1.876 m Patient Age:    74 years  BP:           116/83 mmHg Patient Gender: F                        HR:           71 bpm. Exam Location:  Inpatient Procedure: 2D Echo, Cardiac Doppler and Color Doppler Indications:    Syncope  History:        Patient has prior history of Echocardiogram examinations, most                 recent 11/27/2002. Risk Factors:Hypertension.  Sonographer:    Clayton Lefort RDCS (AE) Referring Phys: [place]  1. Left ventricular ejection fraction, by estimation, is 65 to 70%. The left ventricle has normal function. The left ventricle has no regional wall motion abnormalities. There is severe asymmetric left ventricular hypertrophy of the basal-septal segment. The IVSd is 1.9 cm - no LVOT obstruction is noted at rest. Valsalva not performed. Findings suggest possible hypertrophic cardiomyopathy. Left ventricular diastolic parameters are consistent with Grade I  diastolic dysfunction (impaired relaxation).  2. Right ventricular systolic function is normal. The right ventricular size is normal. There is normal pulmonary artery systolic pressure.  3. The aortic valve is tricuspid. Aortic valve regurgitation is not visualized.  4. The inferior vena cava is normal in size with greater than 50% respiratory variability, suggesting right atrial pressure of 3 mmHg.  5. The mitral valve is grossly normal. Trivial mitral valve regurgitation. Conclusion(s)/Recommendation(s): Findings consistent with possible hypertrophic cardiomyopathy. Consider cMRI to evalute further. FINDINGS  Left Ventricle: Left ventricular ejection fraction, by estimation, is 65 to 70%. The left ventricle has normal function. The left ventricle has no regional wall motion abnormalities. The left ventricular internal cavity size was normal in size. There is  severe asymmetric left ventricular hypertrophy of the basal-septal segment. Left ventricular diastolic parameters are consistent with Grade I diastolic dysfunction (impaired relaxation). Indeterminate filling pressures. Right Ventricle: The right ventricular size is normal. No increase in right ventricular wall thickness. Right ventricular systolic function is normal. There is normal pulmonary artery systolic pressure. The tricuspid regurgitant velocity is 2.57 m/s, and  with an assumed right atrial pressure of 3 mmHg, the estimated right ventricular systolic pressure is 93.8 mmHg. Left Atrium: Left atrial size was normal in size. Right Atrium: Right atrial size was normal in size. Pericardium: There is no evidence of pericardial effusion. Mitral Valve: The mitral valve is grossly normal. Trivial mitral valve regurgitation. MV peak gradient, 4.1 mmHg. The mean mitral valve gradient is 2.0 mmHg. No SAM is noted. Tricuspid Valve: The tricuspid valve is grossly normal. Tricuspid valve regurgitation is mild. Aortic Valve: The aortic valve is tricuspid. Aortic  valve regurgitation is not visualized. Aortic valve mean gradient measures 3.0 mmHg. Aortic valve peak gradient measures 5.9 mmHg. Aortic valve area, by VTI measures 2.03 cm. Pulmonic Valve: The pulmonic valve was normal in structure. Pulmonic valve regurgitation is not visualized. Aorta: The aortic root and ascending aorta are structurally normal, with no evidence of dilitation. Venous: The inferior vena cava is normal in size with greater than 50% respiratory variability, suggesting right atrial pressure of 3 mmHg. IAS/Shunts: No atrial level shunt detected by color flow Doppler.  LEFT VENTRICLE PLAX 2D LVIDd:         3.20 cm  Diastology LVIDs:         2.30 cm  LV e' medial:  0.08 cm/s LV PW:         1.40 cm  LV E/e' medial:  10.9 LV IVS:        1.90 cm  LV e' lateral:   0.09 cm/s LVOT diam:     1.90 cm  LV E/e' lateral: 9.2 LV SV:         51 LV SV Index:   27 LVOT Area:     2.84 cm  RIGHT VENTRICLE             IVC RV Basal diam:  3.40 cm     IVC diam: 1.90 cm RV S prime:     11.30 cm/s TAPSE (M-mode): 2.3 cm LEFT ATRIUM             Index       RIGHT ATRIUM           Index LA diam:        3.10 cm 1.65 cm/m  RA Area:     16.90 cm LA Vol (A2C):   38.7 ml 20.63 ml/m RA Volume:   43.80 ml  23.35 ml/m LA Vol (A4C):   27.7 ml 14.76 ml/m LA Biplane Vol: 34.2 ml 18.23 ml/m  AORTIC VALVE AV Area (Vmax):    2.06 cm AV Area (Vmean):   2.01 cm AV Area (VTI):     2.03 cm AV Vmax:           121.00 cm/s AV Vmean:          78.600 cm/s AV VTI:            0.251 m AV Peak Grad:      5.9 mmHg AV Mean Grad:      3.0 mmHg LVOT Vmax:         88.10 cm/s LVOT Vmean:        55.600 cm/s LVOT VTI:          0.180 m LVOT/AV VTI ratio: 0.72  AORTA Ao Root diam: 2.90 cm Ao Asc diam:  3.00 cm MITRAL VALVE               TRICUSPID VALVE MV Area (PHT): 3.16 cm    TR Peak grad:   26.4 mmHg MV Area VTI:   1.82 cm    TR Vmax:        257.00 cm/s MV Peak grad:  4.1 mmHg MV Mean grad:  2.0 mmHg    SHUNTS MV Vmax:       1.01 m/s    Systemic  VTI:  0.18 m MV Vmean:      59.4 cm/s   Systemic Diam: 1.90 cm MV Decel Time: 240 msec MV E velocity: 0.83 cm/s MV A velocity: 83.60 cm/s MV E/A ratio:  0.01 Zoila Shutter MD Electronically signed by Zoila Shutter MD Signature Date/Time: 12/15/2020/12:52:05 PM    Final     Assessment and Plan:   1.  Possible HOCM: -No significant arrhythmias on telemetry -This has never happened before. -She may have been a little bit dehydrated due to her GI issues although orthostatic vital signs were negative -strong FH CAD, but no history of SCD. - Discuss with MD if any additional testing is needed -Otherwise, she could be continued on a beta-blocker, make sure not to get dehydrated, and follow-up in the office -She could also wear a monitor to make sure no significant arrhythmias are seen -Without further documentation of HOCM, do not believe she will qualify for an ICD  2.  Hypertension: -Her blood pressures been elevated generally since admission, 1 reading of 108/75, everything other SBP is in the 130s-170s. -She is on her home medication of Coreg 6.25 mg twice daily -Discuss increasing this with MD  Otherwise, per IM Principal Problem:   Postural dizziness with presyncope Active Problems:   Chest pain   Essential hypertension   Anxiety disorder, unspecified   Chronic pain   Lumbar post-laminectomy syndrome   Dysphagia   Odynophagia     For questions or updates, please contact [facility] Please consult [email or web address] for contact info under Cardiology/STEMI.   Jonetta Speak, PA-C  12/16/2020 12:21 PM

## 2020-12-16 NOTE — Op Note (Signed)
[facility] Patient Name: [name] Procedure Date : 12/16/2020 MRN: [id number] Attending MD: Ladene Artist , MD Date of Birth: Feb 24, 1961 CSN: [id number] Age: 60 Admit Type: Inpatient Procedure:                Upper GI endoscopy Indications:              Dysphagia, Odynophagia, Unexplained chest pain Providers:                Pricilla Riffle. Fuller Plan, MD, Nelia Shi, RN,                            Tyrone Apple, Technician, Gala Lewandowsky, CRNA Referring MD:             [facility] Medicines:                Monitored Anesthesia Care Complications:            No immediate complications. Estimated Blood Loss:     Estimated blood loss: none. Procedure:                Pre-Anesthesia Assessment:                           - Prior to the procedure, a History and Physical                            was performed, and patient medications and                            allergies were reviewed. The patient's tolerance of                            previous anesthesia was also reviewed. The risks                            and benefits of the procedure and the sedation                            options and risks were discussed with the patient.                            All questions were answered, and informed consent                            was obtained. Prior Anticoagulants: The patient has                            taken no previous anticoagulant or antiplatelet                            agents. ASA Grade Assessment: II - A patient with                            mild systemic disease. After reviewing the risks  and benefits, the patient was deemed in                            satisfactory condition to undergo the procedure.                           After obtaining informed consent, the endoscope was                            passed under direct vision. Throughout the                            procedure, the patient's blood pressure,  pulse, and                            oxygen saturations were monitored continuously. The                            GIF-H190 ([id number] Olympus gastroscope was                            introduced through the mouth, and advanced to the                            second part of duodenum. The upper GI endoscopy was                            accomplished without difficulty. The patient                            tolerated the procedure well. Scope In: Scope Out: Findings:      No endoscopic abnormality was evident in the esophagus to explain the       patient's complaint of dysphagia. Biopsies were taken with a cold       forceps for histology in the mid and distal esophagus. It was decided,       however, to proceed with dilation of the entire esophagus. A guidewire       was placed and the scope was withdrawn. Dilation was performed with a       Savary dilator with no resistance at 17 mm. Trace heme on dilator likey       from biopsy sites.      The stomach was normal.      The examined duodenum was normal.      The cardia and gastric fundus were normal on retroflexion. Impression:               - No endoscopic esophageal abnormality to explain                            patient's dysphagia. Esophagus dilated. Biopsied.                           - Normal stomach.                           -  Normal examined duodenum. Recommendation:           - Patient has a contact number available for                            emergencies. The signs and symptoms of potential                            delayed complications were discussed with the                            patient. Return to normal activities tomorrow.                            Written discharge instructions were provided to the                            patient.                           - No explaination for dysphagia, odynophagia, chest                            pain.                           - Clear liquid diet for 2 hours,  then advance as                            tolerated to full liquid diet. Pt can advance diet                            gradually at home as symptoms improve.                           - Continue present medications including                            pantoprazole 40 mg bid, sucralfate suspension 1 g                            ac & hs, and viscous lidocaine 2% 15 mL po ac prn                            odynophagia.                           - Await pathology results.                           - OK for discharge from GI standpoint.                           - Return to GI office, Dr. Loletha Carrow, in 2 weeks. Procedure Code(s):        --- Professional ---  [phone], Esophagogastroduodenoscopy, flexible,                            transoral; with insertion of guide wire followed by                            passage of dilator(s) through esophagus over guide                            wire                           43239, 59, Esophagogastroduodenoscopy, flexible,                            transoral; with biopsy, single or multiple Diagnosis Code(s):        --- Professional ---                           R13.10, Dysphagia, unspecified                           R07.9, Chest pain, unspecified CPT copyright 2019 American Medical Association. All rights reserved. The codes documented in this report are preliminary and upon coder review may  be revised to meet current compliance requirements. Ladene Artist, MD 12/16/2020 10:47:19 AM This report has been signed electronically. Number of Addenda: 0

## 2020-12-16 NOTE — Progress Notes (Signed)
PROGRESS NOTE  [name] GBT:[id number] DOB: Feb 07, 1961 DOA: 12/14/2020 PCP: [name], [name], MD   LOS: 1 day   Brief Narrative / Interim history: 60 year old female with history of hypertension, chronic lower back pain status post spinal stimulator, comes to the hospital with complaints of syncope.  Over the last week, patient has been complaining of significant solid food dysphagia associated with chest pain, getting very scared at times, and overall feeling very weak, lightheaded and dizzy at times.  She also reports new onset of right arm tremors.  She had to make significant changes in her diet, changed to soft/pure and liquids as she tolerates better these foods.  She went to see her PCP the other day, who sent her for an x-ray and while getting imaging she passed out.  Subjective / 24h Interval events: Still has some lightheadedness and ongoing chest discomfort  Assessment & Plan: Principal Problem Dizziness, syncope -unclear etiology, neurology consulted and appreciate input.  We are unable to do an MRI due to her spinal implant stimulator.  Neurology suspects BPPV possibly as a consequence of the fall and mild head injury, but cannot completely exclude a small stroke.  EEG done on 2/19 and within normal limits without seizures or epileptiform discharges  Active Problems Dysphagia, chest pain-this has been relatively sudden about a week ago, has been persistent and her chest pain is related to eating.  Her troponin is negative making ACS very unlikely given constant chest pain for the past week which is worse with food.  GI consulted and she will get an EGD today  Possible hypertrophic cardiomyopathy-on 2D echo done on 2/19, cardiology consulted, appreciate input  Chronic back pain -Spinal stimulator in place, continue home medications  Hyperlipidemia -Continue atorvastatin  Hypertension -continue Coreg  Scheduled Meds: . atorvastatin  20 mg Oral Daily  .  carvedilol  6.25 mg Oral BID WC  . DULoxetine  30 mg Oral Daily  . gabapentin  400 mg Oral TID  . pantoprazole  40 mg Oral BID AC  . sodium chloride flush  3 mL Intravenous Q12H  . sucralfate  1 g Oral TID WC & HS   Continuous Infusions: . sodium chloride     PRN Meds:.sodium chloride, acetaminophen **OR** acetaminophen, ALPRAZolam, methocarbamol, sodium chloride flush, traMADol  Diet Orders (From admission, onward)    Start     Ordered   12/16/20 0610  Diet NPO time specified  Diet effective now        12/16/20 7106          DVT prophylaxis:      Code Status: Full Code  Family Communication: no family at bedside   Status is: Observation  The patient will require care spanning > 2 midnights and should be moved to inpatient because: IV treatments appropriate due to intensity of illness or inability to take PO and Inpatient level of care appropriate due to severity of illness  Dispo: The patient is from: Home              Anticipated d/c is to: Home              Anticipated d/c date is: 1 day              Patient currently is not medically stable to d/c.   Difficult to place patient No   Level of care: Telemetry Medical  Consultants:  GI Neurology   Procedures:  2D echo: pending  Microbiology  none  Antimicrobials: none    Objective: Vitals:   12/15/20 2005 12/16/20 0029 12/16/20 0411 12/16/20 0748  BP: 134/89 (!) 142/88 108/75 (!) 147/89  Pulse: 74 68 (!) 107   Resp: 12 11 10 10   Temp: 97.7 F (36.5 C) 98.2 F (36.8 C) (!) 97.5 F (36.4 C) (!) 97.5 F (36.4 C)  TempSrc: Oral Oral Oral Oral  SpO2: 99%  98%   Weight:      Height:       No intake or output data in the 24 hours ending 12/16/20 0843 Filed Weights   12/14/20 1435 12/14/20 1922  Weight: 76.2 kg 80.1 kg    Examination:  Constitutional: No distress Eyes: No icterus ENMT: mmm Neck: normal, supple Respiratory: Clear bilaterally without wheezing or crackles, normal respiratory  effort Cardiovascular: Regular rate and rhythm, no edema Abdomen: Soft, NT, ND, bowel sounds positive Musculoskeletal: no clubbing / cyanosis.  Skin: No rashes Neurologic: Nonfocal. Resting tremor right hand resolved today  Data Reviewed: I have independently reviewed following labs and imaging studies   CBC: Recent Labs  Lab 12/14/20 1010 12/14/20 1230 12/15/20 0051 12/16/20 0124  WBC 3.9* 4.3 5.7 4.8  HGB 12.3 12.6 11.7* 11.8*  HCT 37.4 39.2 35.4* 35.8*  MCV 90.1 91.6 90.1 89.9  PLT 264.0 284 255 696   Basic Metabolic Panel: Recent Labs  Lab 12/14/20 1010 12/14/20 1230 12/15/20 0051 12/16/20 0124  NA 142 139 138 136  K 4.5 3.9 3.7 3.8  CL 105 103 103 101  CO2 33* 28 23 28   GLUCOSE 90 96 94 92  BUN 12 9 10 7   CREATININE 1.07 0.99 0.91 0.94  CALCIUM 9.4 9.0 8.5* 8.7*   Liver Function Tests: No results for input(s): AST, ALT, ALKPHOS, BILITOT, PROT, ALBUMIN in the last 168 hours. Coagulation Profile: No results for input(s): INR, PROTIME in the last 168 hours. HbA1C: No results for input(s): HGBA1C in the last 72 hours. CBG: Recent Labs  Lab 12/14/20 1352  GLUCAP 80    Recent Results (from the past 240 hour(s))  SARS CORONAVIRUS 2 (TAT 6-24 HRS) Nasopharyngeal Nasopharyngeal Swab     Status: None   Collection Time: 12/14/20  2:16 PM   Specimen: Nasopharyngeal Swab  Result Value Ref Range Status   SARS Coronavirus 2 NEGATIVE NEGATIVE Final    Comment: (NOTE) SARS-CoV-2 target nucleic acids are NOT DETECTED.  The SARS-CoV-2 RNA is generally detectable in upper and lower respiratory specimens during the acute phase of infection. Negative results do not preclude SARS-CoV-2 infection, do not rule out co-infections with other pathogens, and should not be used as the sole basis for treatment or other patient management decisions. Negative results must be combined with clinical observations, patient history, and epidemiological information. The  expected result is Negative.  Fact Sheet for Patients: SugarRoll.be  Fact Sheet for Healthcare Providers: https://www.woods-mathews.com/  This test is not yet approved or cleared by the [place] FDA and  has been authorized for detection and/or diagnosis of SARS-CoV-2 by FDA under an Emergency Use Authorization (EUA). This EUA will remain  in effect (meaning this test can be used) for the duration of the COVID-19 declaration under Se ction 564(b)(1) of the Act, 21 U.S.C. section 360bbb-3(b)(1), unless the authorization is terminated or revoked sooner.  Performed at [facility], [place] [address] [place],  [address]   MRSA PCR Screening     Status: None   Collection Time: 12/14/20 11:37 PM   Specimen: Nasopharyngeal  Result Value Ref Range Status   MRSA by PCR NEGATIVE NEGATIVE Final    Comment:        The GeneXpert MRSA Assay (FDA approved for NASAL specimens only), is one component of a comprehensive MRSA colonization surveillance program. It is not intended to diagnose MRSA infection nor to guide or monitor treatment for MRSA infections. Performed at [facility], [place] [address] [place], [place] [address]      Radiology Studies: EEG  Result Date: 12/15/2020 Lora Havens, MD     12/15/2020  3:55 PM Patient Name: Arsema Tusing MRN: [id number] Epilepsy Attending: Lora Havens Referring Physician/Provider: Clance Boll, NP Date: 12/15/2020 Duration: 24.29 mins Patient history: 60yo F with syncope, vertigo. EEG to evaluate for seizure Level of alertness: Awake, asleep AEDs during EEG study: GBP Technical aspects: This EEG study was done with scalp electrodes positioned according to the 10-20 International system of electrode placement. Electrical activity was acquired at a sampling rate of 500Hz  and reviewed with a high frequency filter of 70Hz  and a low frequency filter of 1Hz . EEG data were  recorded continuously and digitally stored. Description: The posterior dominant rhythm consists of 9-10 Hz activity of moderate voltage (25-35 uV) seen predominantly in posterior head regions, symmetric and reactive to eye opening and eye closing. Sleep was characterized by vertex waves, sleep spindles (12 to 14 Hz), maximal frontocentral region. Hyperventilation did not show any EEG change. Hyperventilation and photic stimulation were not performed.   IMPRESSION: This study is within normal limits. No seizures or epileptiform discharges were seen throughout the recording. Lora Havens   ECHOCARDIOGRAM COMPLETE  Result Date: 12/15/2020    ECHOCARDIOGRAM REPORT   Patient Name:   JALAH WARMUTH Date of Exam: 12/15/2020 Medical Rec #:  [id number]                Height:       65.0 in Accession #:    [id number]               Weight:       176.6 lb Date of Birth:  1961-04-20               BSA:          1.876 m Patient Age:    11 years                 BP:           116/83 mmHg Patient Gender: F                        HR:           71 bpm. Exam Location:  Inpatient Procedure: 2D Echo, Cardiac Doppler and Color Doppler Indications:    Syncope  History:        Patient has prior history of Echocardiogram examinations, most                 recent 11/27/2002. Risk Factors:Hypertension.  Sonographer:    Clayton Lefort RDCS (AE) Referring Phys: [place]  1. Left ventricular ejection fraction, by estimation, is 65 to 70%. The left ventricle has normal function. The left ventricle has no regional wall motion abnormalities. There is severe asymmetric left ventricular hypertrophy of the basal-septal segment. The IVSd is 1.9 cm - no LVOT obstruction is noted at rest. Valsalva not performed. Findings suggest possible hypertrophic cardiomyopathy. Left ventricular diastolic  parameters are consistent with Grade I diastolic dysfunction (impaired relaxation).  2. Right ventricular systolic function is  normal. The right ventricular size is normal. There is normal pulmonary artery systolic pressure.  3. The aortic valve is tricuspid. Aortic valve regurgitation is not visualized.  4. The inferior vena cava is normal in size with greater than 50% respiratory variability, suggesting right atrial pressure of 3 mmHg.  5. The mitral valve is grossly normal. Trivial mitral valve regurgitation. Conclusion(s)/Recommendation(s): Findings consistent with possible hypertrophic cardiomyopathy. Consider cMRI to evalute further. FINDINGS  Left Ventricle: Left ventricular ejection fraction, by estimation, is 65 to 70%. The left ventricle has normal function. The left ventricle has no regional wall motion abnormalities. The left ventricular internal cavity size was normal in size. There is  severe asymmetric left ventricular hypertrophy of the basal-septal segment. Left ventricular diastolic parameters are consistent with Grade I diastolic dysfunction (impaired relaxation). Indeterminate filling pressures. Right Ventricle: The right ventricular size is normal. No increase in right ventricular wall thickness. Right ventricular systolic function is normal. There is normal pulmonary artery systolic pressure. The tricuspid regurgitant velocity is 2.57 m/s, and  with an assumed right atrial pressure of 3 mmHg, the estimated right ventricular systolic pressure is 40.3 mmHg. Left Atrium: Left atrial size was normal in size. Right Atrium: Right atrial size was normal in size. Pericardium: There is no evidence of pericardial effusion. Mitral Valve: The mitral valve is grossly normal. Trivial mitral valve regurgitation. MV peak gradient, 4.1 mmHg. The mean mitral valve gradient is 2.0 mmHg. No SAM is noted. Tricuspid Valve: The tricuspid valve is grossly normal. Tricuspid valve regurgitation is mild. Aortic Valve: The aortic valve is tricuspid. Aortic valve regurgitation is not visualized. Aortic valve mean gradient measures 3.0 mmHg. Aortic  valve peak gradient measures 5.9 mmHg. Aortic valve area, by VTI measures 2.03 cm. Pulmonic Valve: The pulmonic valve was normal in structure. Pulmonic valve regurgitation is not visualized. Aorta: The aortic root and ascending aorta are structurally normal, with no evidence of dilitation. Venous: The inferior vena cava is normal in size with greater than 50% respiratory variability, suggesting right atrial pressure of 3 mmHg. IAS/Shunts: No atrial level shunt detected by color flow Doppler.  LEFT VENTRICLE PLAX 2D LVIDd:         3.20 cm  Diastology LVIDs:         2.30 cm  LV e' medial:    0.08 cm/s LV PW:         1.40 cm  LV E/e' medial:  10.9 LV IVS:        1.90 cm  LV e' lateral:   0.09 cm/s LVOT diam:     1.90 cm  LV E/e' lateral: 9.2 LV SV:         51 LV SV Index:   27 LVOT Area:     2.84 cm  RIGHT VENTRICLE             IVC RV Basal diam:  3.40 cm     IVC diam: 1.90 cm RV S prime:     11.30 cm/s TAPSE (M-mode): 2.3 cm LEFT ATRIUM             Index       RIGHT ATRIUM           Index LA diam:        3.10 cm 1.65 cm/m  RA Area:     16.90 cm LA Vol (A2C):   38.7 ml 20.63  ml/m RA Volume:   43.80 ml  23.35 ml/m LA Vol (A4C):   27.7 ml 14.76 ml/m LA Biplane Vol: 34.2 ml 18.23 ml/m  AORTIC VALVE AV Area (Vmax):    2.06 cm AV Area (Vmean):   2.01 cm AV Area (VTI):     2.03 cm AV Vmax:           121.00 cm/s AV Vmean:          78.600 cm/s AV VTI:            0.251 m AV Peak Grad:      5.9 mmHg AV Mean Grad:      3.0 mmHg LVOT Vmax:         88.10 cm/s LVOT Vmean:        55.600 cm/s LVOT VTI:          0.180 m LVOT/AV VTI ratio: 0.72  AORTA Ao Root diam: 2.90 cm Ao Asc diam:  3.00 cm MITRAL VALVE               TRICUSPID VALVE MV Area (PHT): 3.16 cm    TR Peak grad:   26.4 mmHg MV Area VTI:   1.82 cm    TR Vmax:        257.00 cm/s MV Peak grad:  4.1 mmHg MV Mean grad:  2.0 mmHg    SHUNTS MV Vmax:       1.01 m/s    Systemic VTI:  0.18 m MV Vmean:      59.4 cm/s   Systemic Diam: 1.90 cm MV Decel Time: 240 msec MV E  velocity: 0.83 cm/s MV A velocity: 83.60 cm/s MV E/A ratio:  0.01 Lyman Bishop MD Electronically signed by Lyman Bishop MD Signature Date/Time: 12/15/2020/12:52:05 PM    Final    Marzetta Board, MD, PhD [facility]  Between 7 am - 7 pm I am available, please contact me via Amion or Securechat  Between 7 pm - 7 am I am not available, please contact night coverage MD/APP via Amion

## 2020-12-16 NOTE — Anesthesia Postprocedure Evaluation (Signed)
Anesthesia Post Note  Patient: Nastasha Reising  Procedure(s) Performed: ESOPHAGOGASTRODUODENOSCOPY (EGD) WITH PROPOFOL (N/A ) BIOPSY SAVORY DILATION (N/A )     Patient location during evaluation: Endoscopy Anesthesia Type: MAC Level of consciousness: awake and alert Pain management: pain level controlled Vital Signs Assessment: post-procedure vital signs reviewed and stable Respiratory status: spontaneous breathing, nonlabored ventilation, respiratory function stable and patient connected to nasal cannula oxygen Cardiovascular status: blood pressure returned to baseline and stable Postop Assessment: no apparent nausea or vomiting Anesthetic complications: no   No complications documented.  Last Vitals:  Vitals:   12/16/20 1046 12/16/20 1055  BP: (!) 151/78 (!) 153/83  Pulse: 71 73  Resp: 13 13  Temp: 36.7 C   SpO2: 98% 97%    Last Pain:  Vitals:   12/16/20 1046  TempSrc: Oral  PainSc: 9    Pain Goal: Patients Stated Pain Goal: 0 (12/15/20 0722)                 Vanassa Penniman L Rosalyn Archambault

## 2020-12-16 NOTE — Progress Notes (Signed)
PT Cancellation Note  Patient Details Name: [name] MRN: [id number] DOB: 1961/02/02   Cancelled Treatment:    Reason Eval/Treat Not Completed: Patient at procedure or test/unavailable. Pt off floor at EGD. Acute PT to return as able to complete PT eval.  Kittie Plater, PT, DPT Acute Rehabilitation Services Pager #: [phone] Office #: [phone]    Berline Lopes 12/16/2020, 10:16 AM

## 2020-12-17 ENCOUNTER — Encounter: Payer: Self-pay | Admitting: *Deleted

## 2020-12-17 ENCOUNTER — Ambulatory Visit: Payer: PPO | Admitting: Physical Therapy

## 2020-12-17 LAB — CBC
HCT: 36 % (ref 36.0–46.0)
Hemoglobin: 12.2 g/dL (ref 12.0–15.0)
MCH: 29.9 pg (ref 26.0–34.0)
MCHC: 33.9 g/dL (ref 30.0–36.0)
MCV: 88.2 fL (ref 80.0–100.0)
Platelets: 272 10*3/uL (ref 150–400)
RBC: 4.08 MIL/uL (ref 3.87–5.11)
RDW: 13 % (ref 11.5–15.5)
WBC: 5.7 10*3/uL (ref 4.0–10.5)
nRBC: 0 % (ref 0.0–0.2)

## 2020-12-17 LAB — BASIC METABOLIC PANEL
Anion gap: 9 (ref 5–15)
BUN: 7 mg/dL (ref 6–20)
CO2: 26 mmol/L (ref 22–32)
Calcium: 8.6 mg/dL — ABNORMAL LOW (ref 8.9–10.3)
Chloride: 101 mmol/L (ref 98–111)
Creatinine, Ser: 0.97 mg/dL (ref 0.44–1.00)
GFR, Estimated: >60 mL/min (ref >60)
Glucose, Bld: 115 mg/dL — ABNORMAL HIGH (ref 70–99)
Potassium: 3.4 mmol/L — ABNORMAL LOW (ref 3.5–5.1)
Sodium: 136 mmol/L (ref 135–145)

## 2020-12-17 MED ORDER — POLYETHYLENE GLYCOL 3350 17 G PO PACK
17.0000 g | PACK | Freq: Every day | ORAL | Status: DC
  Administered 2020-12-17 – 2020-12-20 (×4): 17 g via ORAL
  Filled 2020-12-17 (×4): qty 1

## 2020-12-17 MED ORDER — POTASSIUM CHLORIDE CRYS ER 20 MEQ PO TBCR
30.0000 meq | EXTENDED_RELEASE_TABLET | Freq: Once | ORAL | Status: AC
  Administered 2020-12-17: 30 meq via ORAL
  Filled 2020-12-17: qty 1

## 2020-12-17 MED ORDER — SENNOSIDES-DOCUSATE SODIUM 8.6-50 MG PO TABS
2.0000 | ORAL_TABLET | Freq: Every day | ORAL | Status: DC
  Administered 2020-12-17 – 2020-12-19 (×3): 2 via ORAL
  Filled 2020-12-17 (×3): qty 2

## 2020-12-17 MED ORDER — MORPHINE SULFATE (PF) 2 MG/ML IV SOLN
2.0000 mg | INTRAVENOUS | Status: DC | PRN
  Administered 2020-12-17 – 2020-12-19 (×4): 2 mg via INTRAVENOUS
  Filled 2020-12-17 (×4): qty 1

## 2020-12-17 NOTE — Plan of Care (Signed)

## 2020-12-17 NOTE — Evaluation (Signed)
Physical Therapy Evaluation Patient Details Name: [name] MRN: [id number] DOB: 04-21-1961 Today's Date: 12/17/2020   History of Present Illness  Pt is a 60 y.o. female with chest pain x about 1 week which felt like food was stuck. Pt told her PCP she had dizziness, defined as room spinning. While MD sent pt for Xray, pt felt like she was going to pass out and did. She was dizzy prior to incident but denied any flashing lights or smells.  Per note from PCP, there is question as to witnessed seizure activity, but patient was not post ictal.  Pt imaging studies negative for acute changes including CT neck, Chest xray.   EGD from 2/20 showed no endoscopic esophageal abnormality to explain patient's dysphagias, but her esophagus was dilated. PMHx of HTN, back pain, spinal stenosis with stimulator implant, right foot drop, HLD, anxiety, and depression.  Clinical Impression  Pt admitted with above diagnosis. Pt was dizzy on arrival 7/10.  Assisted pt to bed and performed BPPV testing with positive test for left BPPV posterior canal.  Pt reported 5/10 dizziness after treatment.  Wanted to get back in chair therefore PT assisted pt back to chair. Pt tolerated vestibular treatment well overall.  She is unsteady and needs further assessment and treatment of vestibular system as suspect left hypofunction as well with pt probably needing x 1 exercises as well. Will follow acutely. Notified MD that pt needs another PT assessment in am. Pt currently with functional limitations due to the deficits listed below (see PT Problem List). Pt will benefit from skilled PT to increase their independence and safety with mobility to allow discharge to the venue listed below.      Follow Up Recommendations Supervision/Assistance - 24 hour;Outpatient PT (vestibular rehab)    Equipment Recommendations  None recommended by PT    Recommendations for Other Services       Precautions / Restrictions  Precautions Precautions: Fall Required Braces or Orthoses: Other Brace (right AFO) Restrictions Weight Bearing Restrictions: No      Mobility  Bed Mobility Overal bed mobility: Needs Assistance Bed Mobility: Rolling;Sidelying to Sit Rolling: Min assist Sidelying to sit: Min assist       General bed mobility comments: Needed assist due to dizziness    Transfers Overall transfer level: Needs assistance Equipment used: 4-wheeled walker Transfers: Sit to/from [facility] Sit to Stand: Mod assist;Min assist Stand pivot transfers: Min assist       General transfer comment: Needed assist to power up from recliner as well as mod assist to steady as she was dizzy. Encouraged pt to focus on object as pt tends to just close her eyes.  Pt was able to step to bed for vertigo assessment and back to chair after this assessment was completed.  Ambulation/Gait                Stairs            Wheelchair Mobility    Modified Rankin (Stroke Patients Only)       Balance Overall balance assessment: Needs assistance Sitting-balance support: No upper extremity supported;Feet supported Sitting balance-Leahy Scale: Fair     Standing balance support: Bilateral upper extremity supported;During functional activity Standing balance-Leahy Scale: Poor Standing balance comment: relies on UE and external support for static support as she loses balance and needs mod assist  Pertinent Vitals/Pain Pain Assessment: Faces Faces Pain Scale: Hurts even more Pain Location: back Pain Descriptors / Indicators: Discomfort Pain Intervention(s): Limited activity within patient's tolerance;Monitored during session;Repositioned    Home Living Family/patient expects to be discharged to:: Private residence Living Arrangements: Spouse/significant other Available Help at Discharge: Family;Available 24 hours/day (husband works but friend  can stay with pt a few days per pt) Type of Home: House Home Access: Stairs to enter Entrance Stairs-Rails: Right Entrance Stairs-Number of Steps: [place]: One level Home Equipment: [place] - 2 wheels;Cane - single point;Bedside commode;Shower seat;Walker - 4 wheels;Adaptive equipment      Prior Function Level of Independence: Independent with assistive device(s)         Comments: uses rollator due to foot drop. Was seeing Vinnie Level Dilday at [facility] PTA per pt     Hand Dominance   Dominant Hand: Right    Extremity/Trunk Assessment   Upper Extremity Assessment Upper Extremity Assessment: Defer to OT evaluation    Lower Extremity Assessment Lower Extremity Assessment: RLE deficits/detail;LLE deficits/detail RLE Deficits / Details: foot drop noted.  Knee and hip 3/5 RLE Sensation: decreased light touch;decreased proprioception LLE Deficits / Details: grossly 3/5    Cervical / Trunk Assessment Cervical / Trunk Assessment: Kyphotic  Communication   Communication: No difficulties  Cognition Arousal/Alertness: Awake/alert Behavior During Therapy: WFL for tasks assessed/performed Overall Cognitive Status: Within Functional Limits for tasks assessed                                        General Comments General comments (skin integrity, edema, etc.): Dizziness 7/10 on arrival. Pt tested positive for left BPPV therefore treeated with canalith repositioning maneuver. after treatment, pt reports dizziness 5/10 when PT leaving room. May need exercises tomorrow as suspect pt may have hypofunction and needs further assessment.    Exercises     Assessment/Plan    PT Assessment Patient needs continued PT services  PT Problem List Decreased activity tolerance;Decreased balance;Decreased mobility;Decreased coordination;Decreased knowledge of use of DME;Decreased safety awareness;Decreased knowledge of precautions;Decreased strength       PT Treatment  Interventions DME instruction;Gait training;Functional mobility training;Therapeutic activities;Therapeutic exercise;Balance training;Patient/family education;Stair training    PT Goals (Current goals can be found in the Care Plan section)  Acute Rehab PT Goals Patient Stated Goal: to go home PT Goal Formulation: With patient Time For Goal Achievement: 12/31/20 Potential to Achieve Goals: Good    Frequency Min 3X/week   Barriers to discharge        Co-evaluation               AM-PAC PT "6 Clicks" Mobility  Outcome Measure Help needed turning from your back to your side while in a flat bed without using bedrails?: None Help needed moving from lying on your back to sitting on the side of a flat bed without using bedrails?: A Little Help needed moving to and from a bed to a chair (including a wheelchair)?: A Lot Help needed standing up from a chair using your arms (e.g., wheelchair or bedside chair)?: A Lot Help needed to walk in hospital room?: A Lot Help needed climbing 3-5 steps with a railing? : A Lot 6 Click Score: 15    End of Session Equipment Utilized During Treatment: Gait belt Activity Tolerance: Patient limited by fatigue (limited by dizziness) Patient left: in chair;with call bell/phone within reach Nurse  Communication: Mobility status PT Visit Diagnosis: Unsteadiness on feet (R26.81);Muscle weakness (generalized) (M62.81)    Time: 1203-1221 PT Time Calculation (min) (ACUTE ONLY): 18 min   Charges:   PT Evaluation $PT Eval Moderate Complexity: 1 Mod          Chaitanya Amedee W,PT Acute Rehabilitation Services Pager:  [phone]  Office:  [place] 12/17/2020, 1:42 PM

## 2020-12-17 NOTE — Progress Notes (Signed)
Patient ID: [name], female   DOB: 08-Nov-1960, 60 y.o.   MRN: [id number] Patient enrolled for [employer] to ship a 30 day cardiac event monitor to her home. Letter with instructions mailed to patient.

## 2020-12-17 NOTE — Evaluation (Signed)
Clinical/Bedside Swallow Evaluation Patient Details  Name: [name] MRN: [id number] Date of Birth: 1961/07/22  Today's Date: 12/17/2020 Time: SLP Start Time (ACUTE ONLY): 1443 SLP Stop Time (ACUTE ONLY): 0838 SLP Time Calculation (min) (ACUTE ONLY): 12 min  Past Medical History:  Past Medical History:  Diagnosis Date  . Anxiety   . Back pain    related to spinal stenosis and disc problem, radiates down left buttocks to leg., weakness occ.  [name] Dyspnea   . Headache   . Hyperlipidemia   . Hypertension   . Lumbar post-laminectomy syndrome   . PONV (postoperative nausea and vomiting)   . Vaginal foreign object    "Uses Femring"   Past Surgical History:  Past Surgical History:  Procedure Laterality Date  . ABDOMINAL HYSTERECTOMY    . BIOPSY  12/16/2020   Procedure: BIOPSY;  Surgeon: Ladene Artist, MD;  Location: [facility];  Service: Endoscopy;;  . CARDIAC CATHETERIZATION N/A 04/18/2015   Procedure: Left Heart Cath and Coronary Angiography;  Surgeon: [name], MD;  Location: [facility];  Service: Cardiovascular;  Laterality: N/A;  . COLONOSCOPY W/ BIOPSIES AND POLYPECTOMY    . ESOPHAGOGASTRODUODENOSCOPY (EGD) WITH PROPOFOL N/A 12/16/2020   Procedure: ESOPHAGOGASTRODUODENOSCOPY (EGD) WITH PROPOFOL;  Surgeon: Ladene Artist, MD;  Location: [facility] ENDOSCOPY;  Service: Endoscopy;  Laterality: N/A;  . FOOT SURGERY Bilateral    [place] "bunion,bone spur, tendon" (1) -6'16, (1)-10'16  . IR EPIDUROGRAPHY  07/21/2018  . LUMBAR LAMINECTOMY/DECOMPRESSION MICRODISCECTOMY Bilateral 12/28/2015   Procedure: MICRO LUMBAR DECOMPRESSION L4 - L5 BILATERALLY;  Surgeon: [name], MD;  Location: [facility];  Service: Orthopedics;  Laterality: Bilateral;  . LUMBAR LAMINECTOMY/DECOMPRESSION MICRODISCECTOMY Bilateral 03/04/2018   Procedure: Revision of Microlumbar Decompression Bilateral Lumbar Four-Five;  Surgeon: [name], MD;  Location: [facility];  Service:  Orthopedics;  Laterality: Bilateral;  90 mins  . SAVORY DILATION N/A 12/16/2020   Procedure: SAVORY DILATION;  Surgeon: Ladene Artist, MD;  Location: [facility] ENDOSCOPY;  Service: Endoscopy;  Laterality: N/A;  . SPINAL CORD STIMULATOR INSERTION N/A 09/28/2019   Procedure: THORACIC SPINAL CORD STIMULATOR INSERTION;  Surgeon: Melina Schools, MD;  Location: [place] [facility];  Service: Orthopedics;  Laterality: N/A;  2.5 hrs  . TUBAL LIGATION    . WISDOM TOOTH EXTRACTION    . WOUND EXPLORATION N/A 03/04/2018   Procedure: EXPLORATION OF LUMBAR DECOMPRESSION WOUND;  Surgeon: [name], MD;  Location: [employer];  Service: Orthopedics;  Laterality: N/A;   HPI:  Pt is a 60 y.o. female with chest pain x about 1 week which felt like food was stuck. CP continued without dyspepsia and she did not seek treatment. 2 nights ago her pain woke her up and she called her PCP and made an appointment for today. Patient states that she has worried a lot about this chest pain because her mother died of a heart attack. She told her PCP today that she just didn't feel right and she c/o dizziness, defined as room spinning. PCP needed labs and xray. Patient had labs drawn. While in Xray, she felt like she was going to pass out and did. She was dizzy prior to incident but denied any flashing lights or smells. She is normally not squeamish nor gets dizzy with lab draws. Per note from PCP, there is question as to witnessed seizure activity, but patient was not post ictal.  Swallow eval ordered.  Pt imaging studies negative for acute changes including CT neck, Chest xray.  Pt  did undergo prior UGI study in 2008 per GI order which was negative for esophageal function. EGD from 2/20 showed no endoscopic esophageal abnormality to explain patient's dysphagias, but her esophagus was dilated. PMHx of HTN, back pain, spinal stenosis with stimulator implant, right foot drop, HLD, anxiety, and depression.   Assessment / Plan / Recommendation Clinical  Impression  No overt s/s of aspiration were noted during today's evaluation. Pt did report pain in chest when swallowing but overall her oropharyngeal swallow appears WFL. Recomend continuation of full liquid diet and to gradually advance diet at home per GI report. Provided education for diet advancement once pt returns home and continued f/u with GI if symptoms continue to persist. SLP to sign off at this time.   SLP Visit Diagnosis: Dysphagia, unspecified (R13.10)    Aspiration Risk  Mild aspiration risk    Diet Recommendation Thin liquid   Liquid Administration via: Cup;Straw Medication Administration: Whole meds with liquid (as tolerated - pt may prefer crushing larger pills) Supervision: Patient able to self feed;Intermittent supervision to cue for compensatory strategies Compensations: Slow rate;Small sips/bites Postural Changes: Seated upright at 90 degrees;Remain upright for at least 30 minutes after po intake    Other  Recommendations Oral Care Recommendations: Oral care BID   Follow up Recommendations None      Frequency and Duration            Prognosis Prognosis for Safe Diet Advancement: Good      Swallow Study   General HPI: Pt is a 60 y.o. female with chest pain x about 1 week which felt like food was stuck. CP continued without dyspepsia and she did not seek treatment. 2 nights ago her pain woke her up and she called her PCP and made an appointment for today. Patient states that she has worried a lot about this chest pain because her mother died of a heart attack. She told her PCP today that she just didn't feel right and she c/o dizziness, defined as room spinning. PCP needed labs and xray. Patient had labs drawn. While in Xray, she felt like she was going to pass out and did. She was dizzy prior to incident but denied any flashing lights or smells. She is normally not squeamish nor gets dizzy with lab draws. Per note from PCP, there is question as to witnessed  seizure activity, but patient was not post ictal.  Swallow eval ordered.  Pt imaging studies negative for acute changes including CT neck, Chest xray.  Pt did undergo prior UGI study in 2008 per GI order which was negative for esophageal function. EGD from 2/20 showed no endoscopic esophageal abnormality to explain patient's dysphagias, but her esophagus was dilated. PMHx of HTN, back pain, spinal stenosis with stimulator implant, right foot drop, HLD, anxiety, and depression. Type of Study: Bedside Swallow Evaluation Previous Swallow Assessment: None in chart Diet Prior to this Study: Thin liquids Temperature Spikes Noted: No Respiratory Status: Room air History of Recent Intubation: No Behavior/Cognition: Alert;Cooperative;Pleasant mood Oral Cavity Assessment: Within Functional Limits Oral Care Completed by SLP: No Oral Cavity - Dentition: Other (Comment) (Unable to visualize) Vision: Functional for self-feeding Self-Feeding Abilities: Able to feed self Patient Positioning: Upright in bed Baseline Vocal Quality: Normal Volitional Cough: Strong Volitional Swallow: Able to elicit    Oral/Motor/Sensory Function Overall Oral Motor/Sensory Function: Generalized oral weakness (Mild lingual weakness)   Ice Chips Ice chips: Not tested   Thin Liquid Thin Liquid: Within functional limits Presentation: Cup;Self Fed  Nectar Thick Nectar Thick Liquid: Not tested   Honey Thick Honey Thick Liquid: Not tested   Puree Puree: Within functional limits Presentation: Self Fed;Spoon   Solid     Solid: Not tested     Jeanine Luz., SLP Student 12/17/2020,9:19 AM

## 2020-12-17 NOTE — Progress Notes (Signed)
PROGRESS NOTE  [name] JHE:[id number] DOB: 10/21/1961 DOA: 12/14/2020 PCP: [name], [name], MD   LOS: 2 days   Brief Narrative / Interim history: 60 year old female with history of hypertension, chronic lower back pain status post spinal stimulator, comes to the hospital with complaints of syncope.  Over the last week, patient has been complaining of significant solid food dysphagia associated with chest pain, getting very scared at times, and overall feeling very weak, lightheaded and dizzy at times.  She also reports new onset of right arm tremors.  She had to make significant changes in her diet, changed to soft/pure and liquids as she tolerates better these foods.  She went to see her PCP the other day, who sent her for an x-ray and while getting imaging she passed out.  Subjective / 24h Interval events: Ongoing lightheadedness present  Assessment & Plan: Principal Problem Dizziness, syncope -unclear etiology, neurology consulted and appreciate input.  We are unable to do an MRI due to her spinal implant stimulator.  Neurology suspects BPPV possibly as a consequence of the fall and mild head injury, but cannot completely exclude a small stroke.  EEG done on 2/19 and within normal limits without seizures or epileptiform discharges -Worked with PT today, felt very dizzy with vestibular maneuvering. Discussed with the physical therapist, will need a repeat session tomorrow and potentially discharge afterwards  Active Problems Dysphagia, chest pain-this has been relatively sudden about a week ago, has been persistent and her chest pain is related to eating.  Her troponin is negative making ACS very unlikely given constant chest pain for the past week which is worse with food.  GI consulted, had an EGD on 2/20 without significant findings  Possible hypertrophic cardiomyopathy-on 2D echo done on 2/19, cardiology consulted, appreciate input, continue beta-blocker, outpatient  follow-up  Chronic back pain -Spinal stimulator in place, continue home medications  Hyperlipidemia -Continue atorvastatin  Hypertension -continue Coreg  Scheduled Meds: . atorvastatin  20 mg Oral Daily  . carvedilol  12.5 mg Oral BID WC  . DULoxetine  30 mg Oral Daily  . gabapentin  400 mg Oral TID  . pantoprazole  40 mg Oral BID AC  . sodium chloride flush  3 mL Intravenous Q12H  . sucralfate  1 g Oral TID WC & HS   Continuous Infusions: . sodium chloride     PRN Meds:.sodium chloride, acetaminophen **OR** acetaminophen, ALPRAZolam, lidocaine, methocarbamol, phenol, sodium chloride flush, traMADol  Diet Orders (From admission, onward)    Start     Ordered   12/17/20 1152  DIET SOFT Room service appropriate? Yes; Fluid consistency: Thin  Diet effective now       Question Answer Comment  Room service appropriate? Yes   Fluid consistency: Thin      12/17/20 1151          DVT prophylaxis:      Code Status: Full Code  Family Communication: Husband present at bedside  Status is: Inpatient  The patient will require care spanning > 2 midnights and should be moved to inpatient because: IV treatments appropriate due to intensity of illness or inability to take PO and Inpatient level of care appropriate due to severity of illness  Dispo: The patient is from: Home              Anticipated d/c is to: Home              Anticipated d/c date is: 1 day  Patient currently is not medically stable to d/c.   Difficult to place patient No   Level of care: Telemetry Medical  Consultants:  GI Neurology   Procedures:  2D echo  Microbiology  none  Antimicrobials: none    Objective: Vitals:   12/16/20 2018 12/17/20 0357 12/17/20 0810 12/17/20 1138  BP: (!) 155/87 (!) 136/94 (!) 143/92 (!) 147/83  Pulse:  64 67 62  Resp: 15 10 10 18   Temp: 98 F (36.7 C) (!) 97.5 F (36.4 C) 97.7 F (36.5 C) 97.7 F (36.5 C)  TempSrc: Oral Oral Oral Oral  SpO2:  98% 99%    Weight:      Height:        Intake/Output Summary (Last 24 hours) at 12/17/2020 1343 Last data filed at 12/17/2020 1144 Gross per 24 hour  Intake 3 ml  Output 800 ml  Net -797 ml   Filed Weights   12/14/20 1435 12/14/20 1922  Weight: 76.2 kg 80.1 kg    Examination:  Constitutional: No distress Eyes: No icterus ENMT: mmm Neck: normal, supple Respiratory: Clear bilaterally, no wheezing, no crackles Cardiovascular: Regular rate and rhythm Abdomen: Soft, NT, ND, bowel sounds positive Musculoskeletal: no clubbing / cyanosis.  Skin: No rash seen Neurologic: No focal deficits. No tremors  Data Reviewed: I have independently reviewed following labs and imaging studies   CBC: Recent Labs  Lab 12/14/20 1010 12/14/20 1230 12/15/20 0051 12/16/20 0124 12/17/20 0040  WBC 3.9* 4.3 5.7 4.8 5.7  HGB 12.3 12.6 11.7* 11.8* 12.2  HCT 37.4 39.2 35.4* 35.8* 36.0  MCV 90.1 91.6 90.1 89.9 88.2  PLT 264.0 284 255 261 778   Basic Metabolic Panel: Recent Labs  Lab 12/14/20 1010 12/14/20 1230 12/15/20 0051 12/16/20 0124 12/17/20 0040  NA 142 139 138 136 136  K 4.5 3.9 3.7 3.8 3.4*  CL 105 103 103 101 101  CO2 33* 28 23 28 26   GLUCOSE 90 96 94 92 115*  BUN 12 9 10 7 7   CREATININE 1.07 0.99 0.91 0.94 0.97  CALCIUM 9.4 9.0 8.5* 8.7* 8.6*   Liver Function Tests: No results for input(s): AST, ALT, ALKPHOS, BILITOT, PROT, ALBUMIN in the last 168 hours. Coagulation Profile: No results for input(s): INR, PROTIME in the last 168 hours. HbA1C: No results for input(s): HGBA1C in the last 72 hours. CBG: Recent Labs  Lab 12/14/20 1352  GLUCAP 80    Recent Results (from the past 240 hour(s))  SARS CORONAVIRUS 2 (TAT 6-24 HRS) Nasopharyngeal Nasopharyngeal Swab     Status: None   Collection Time: 12/14/20  2:16 PM   Specimen: Nasopharyngeal Swab  Result Value Ref Range Status   SARS Coronavirus 2 NEGATIVE NEGATIVE Final    Comment: (NOTE) SARS-CoV-2 target nucleic  acids are NOT DETECTED.  The SARS-CoV-2 RNA is generally detectable in upper and lower respiratory specimens during the acute phase of infection. Negative results do not preclude SARS-CoV-2 infection, do not rule out co-infections with other pathogens, and should not be used as the sole basis for treatment or other patient management decisions. Negative results must be combined with clinical observations, patient history, and epidemiological information. The expected result is Negative.  Fact Sheet for Patients: SugarRoll.be  Fact Sheet for Healthcare Providers: https://www.woods-mathews.com/  This test is not yet approved or cleared by the [place] FDA and  has been authorized for detection and/or diagnosis of SARS-CoV-2 by FDA under an Emergency Use Authorization (EUA). This EUA will remain  in  effect (meaning this test can be used) for the duration of the COVID-19 declaration under Se ction 564(b)(1) of the Act, 21 U.S.C. section 360bbb-3(b)(1), unless the authorization is terminated or revoked sooner.  Performed at [facility], [place] [address] [place], [place] [address]   MRSA PCR Screening     Status: None   Collection Time: 12/14/20 11:37 PM   Specimen: Nasopharyngeal  Result Value Ref Range Status   MRSA by PCR NEGATIVE NEGATIVE Final    Comment:        The GeneXpert MRSA Assay (FDA approved for NASAL specimens only), is one component of a comprehensive MRSA colonization surveillance program. It is not intended to diagnose MRSA infection nor to guide or monitor treatment for MRSA infections. Performed at [facility], [place] [address], [place], [place] [address]      Radiology Studies: No results found. Marzetta Board, MD, PhD [facility]  Between 7 am - 7 pm I am available, please contact me via Amion or Securechat  Between 7 pm - 7 am I am not available, please contact night coverage MD/APP  via Amion

## 2020-12-18 ENCOUNTER — Encounter: Payer: Self-pay | Admitting: Gastroenterology

## 2020-12-18 LAB — SURGICAL PATHOLOGY

## 2020-12-18 MED ORDER — DIPHENHYDRAMINE HCL 50 MG/ML IJ SOLN
25.0000 mg | Freq: Once | INTRAMUSCULAR | Status: AC
  Administered 2020-12-18: 25 mg via INTRAVENOUS
  Filled 2020-12-18: qty 1

## 2020-12-18 NOTE — Progress Notes (Signed)
Inpatient Rehab Admissions Coordinator:   CIR consult received. Upon chart review, Pt. Does not demonstrate needs in 2 or more rehab disciplines and does not demonstrate medical necessity for CIR admission. She would likely benefit from outpatient vestibular rehab.  CIR will sign off.   Clemens Catholic, [place], [place] Admissions Coordinator  [phone] ([place]) [phone] (office)

## 2020-12-18 NOTE — Progress Notes (Signed)
   12/18/20 1000  PT Recommendation  Recommendations for Other Services Rehab consult  Follow Up Recommendations CIR;Supervision - Intermittent  PT equipment None recommended by PT  Pt continues with Vestibular Rehab issues.  Needs more therapy prior to d/c home as she will not have 24 hour care.  REcommend Rehab consult as feel that she could progress well and be Modif Independent once pt went home.  Thanks.  Dawn W,PT Acute Rehabilitation Services Pager:  [phone]  Office:  [phone]

## 2020-12-18 NOTE — Care Management Important Message (Signed)
Important Message  Patient Details  Name: [name] MRN: [id number] Date of Birth: 10-Mar-1961   Medicare Important Message Given:  Yes     [name] 12/18/2020, 2:13 PM

## 2020-12-18 NOTE — Progress Notes (Addendum)
Physical Therapy Treatment Patient Details Name: [name] MRN: [id number] DOB: Nov 25, 1960 Today's Date: 12/18/2020    History of Present Illness Pt is a 60 y.o. female with chest pain x about 1 week which felt like food was stuck. Pt told her PCP she had dizziness, defined as room spinning. While MD sent pt for Xray, pt felt like she was going to pass out and did. She was dizzy prior to incident but denied any flashing lights or smells.  Per note from PCP, there is question as to witnessed seizure activity, but patient was not post ictal.  Pt imaging studies negative for acute changes including CT neck, Chest xray.   EGD from 2/20 showed no endoscopic esophageal abnormality to explain patient's dysphagias, but her esophagus was dilated. PMHx of HTN, back pain, spinal stenosis with stimulator implant, right foot drop, HLD, anxiety, and depression.    PT Comments    Pt admitted with above diagnosis. Pt was able to tolerate canalith repositioning today.  Dizziness improving but not fully resolved as pt with left hypofunction. Initiated x 1 exercises with pt.  Pt walked with rollator but needed chair follow and up to mod assist as she gets dizzy and loses balance. Chat with MD regarding need for Rehab as feel that vestibular training would help this very motivated pt.  Pt's husband works and cannot be with pt and she currently requires mod assist for ambulation due to dizziness.  She cannot use a wheelchair in the home as well as it would be difficult to navigate from room to room per pt. Overall pt is making progress just difficult with slow progression due to vertigo.  Will continue to follow acutely.  Pt currently with functional limitations due to balance, dizziness and endurance deficits. Pt will benefit from skilled PT to increase their independence and safety with mobility to allow discharge to the venue listed below.    Follow Up Recommendations  CIR;Supervision - Intermittent      Equipment Recommendations  None recommended by PT    Recommendations for Other Services Rehab consult     Precautions / Restrictions Precautions Precautions: Fall Required Braces or Orthoses: Other Brace (right AFO)    Mobility  Bed Mobility Overal bed mobility: Needs Assistance Bed Mobility: Rolling;Sidelying to Sit Rolling: Min assist Sidelying to sit: Min assist       General bed mobility comments: Needed assist due to dizziness    Transfers Overall transfer level: Needs assistance Equipment used: 4-wheeled walker Transfers: Sit to/from [facility] Sit to Stand: Min assist Stand pivot transfers: Min guard       General transfer comment: Needed min guard assist as she was still a little dizzy. Encouraged pt to focus on object as pt tends to just close her eyes.  Pt tolerated standing and turning to get to 3N1 to urinate. Then pt was able to clean herself. Pt stood from 3N1 with same assist.  Ambulation/Gait Ambulation/Gait assistance: Min guard;Min assist;Mod assist Gait Distance (Feet): 60 Feet (40 feet then 20 feet) Assistive device: 4-wheeled walker Gait Pattern/deviations: Step-through pattern;Decreased step length - right;Decreased dorsiflexion - right;Antalgic;Staggering right;Wide base of support   Gait velocity interpretation: <1.31 ft/sec, indicative of household ambulator General Gait Details: Pt was able to walk today with rollator and min guard to min assist.  Pt did have one LOB where she grabbed therapist arm for stability. Pt continues to have periods of dizziness due to vertigo.   Stairs  Wheelchair Mobility    Modified Rankin (Stroke Patients Only)       Balance Overall balance assessment: Needs assistance Sitting-balance support: No upper extremity supported;Feet supported Sitting balance-Leahy Scale: Fair     Standing balance support: Bilateral upper extremity supported;During functional  activity Standing balance-Leahy Scale: Poor Standing balance comment: relies on UE and external support for static support as she loses balance and needs mod assist                            Cognition Arousal/Alertness: Awake/alert Behavior During Therapy: WFL for tasks assessed/performed Overall Cognitive Status: Within Functional Limits for tasks assessed                                        Exercises Other Exercises Other Exercises: x 1 exercises initiated in sittign with handout given Other Exercises: Nestor Lewandowsky - handout given    General Comments General comments (skin integrity, edema, etc.): Dizziness 6/10 iniitally per pt better than yesterday but still present. Pt was able to tolerated left canalith repositioning again and states she felt better after the treatment. Pt does have left hypofunction per testing and PT also initiated x 1 exercises as well and pt to perform 3x - 5 x day.  Pt understands.      Pertinent Vitals/Pain Pain Assessment: No/denies pain    Home Living                      Prior Function            PT Goals (current goals can now be found in the care plan section) Acute Rehab PT Goals Patient Stated Goal: to go home Progress towards PT goals: Progressing toward goals    Frequency    Min 3X/week      PT Plan Discharge plan needs to be updated    Co-evaluation              AM-PAC PT "6 Clicks" Mobility   Outcome Measure  Help needed turning from your back to your side while in a flat bed without using bedrails?: None Help needed moving from lying on your back to sitting on the side of a flat bed without using bedrails?: A Little Help needed moving to and from a bed to a chair (including a wheelchair)?: A Lot Help needed standing up from a chair using your arms (e.g., wheelchair or bedside chair)?: A Lot Help needed to walk in hospital room?: A Lot Help needed climbing 3-5 steps with a  railing? : A Lot 6 Click Score: 15    End of Session Equipment Utilized During Treatment: Gait belt Activity Tolerance: Patient limited by fatigue (limited by dizziness) Patient left: in chair;with call bell/phone within reach;with family/visitor present Nurse Communication: Mobility status PT Visit Diagnosis: Unsteadiness on feet (R26.81);Muscle weakness (generalized) (M62.81)     Time: 6314-9702 PT Time Calculation (min) (ACUTE ONLY): 57 min  Charges:  $Gait Training: 23-37 mins $Therapeutic Exercise: 8-22 mins $Canalith Rep Proc: 8-22 mins                    Emmaly Leech W,PT Acute Rehabilitation Services Pager:  [phone]  Office:  [place] 12/18/2020, 5:34 PM

## 2020-12-18 NOTE — Progress Notes (Signed)
PROGRESS NOTE  [name] ZOX:[id number] DOB: Aug 28, 1961 DOA: 12/14/2020 PCP: [name], [name], MD   LOS: 3 days   Brief Narrative / Interim history: 60 year old female with history of hypertension, chronic lower back pain status post spinal stimulator, comes to the hospital with complaints of syncope.  Over the last week, patient has been complaining of significant solid food dysphagia associated with chest pain, getting very scared at times, and overall feeling very weak, lightheaded and dizzy at times.  She also reports new onset of right arm tremors.  She had to make significant changes in her diet, changed to soft/pure and liquids as she tolerates better these foods.  She went to see her PCP the other day, who sent her for an x-ray and while getting imaging she passed out.  Subjective / 24h Interval events: Very dizzy still, barely able to ambulate  Assessment & Plan: Principal Problem Dizziness, syncope -unclear etiology, neurology consulted and appreciate input.  We are unable to do an MRI due to her spinal implant stimulator.  Neurology suspects BPPV possibly as a consequence of the fall and mild head injury, but cannot completely exclude a small stroke.  EEG done on 2/19 and within normal limits without seizures or epileptiform discharges -Worked with PT again today, PT felt strongly about unsafe discharge at  This point. CIR was recommended, spoke with [facility] and does not qualify. Will repeat PT session tomorrow  Active Problems Dysphagia, chest pain-this has been relatively sudden about a week ago, has been persistent and her chest pain is related to eating.  Her troponin is negative making ACS very unlikely given constant chest pain for the past week which is worse with food.  GI consulted, had an EGD on 2/20 without significant findings. Chest pain seems positional, likely MSK  Possible hypertrophic cardiomyopathy-on 2D echo done on 2/19, cardiology consulted, appreciate  input, continue beta-blocker, outpatient follow-up  Chronic back pain -Spinal stimulator in place, continue home medications  Hyperlipidemia -Continue atorvastatin  Hypertension -continue Coreg  Scheduled Meds: . atorvastatin  20 mg Oral Daily  . carvedilol  12.5 mg Oral BID WC  . DULoxetine  30 mg Oral Daily  . gabapentin  400 mg Oral TID  . pantoprazole  40 mg Oral BID AC  . polyethylene glycol  17 g Oral Daily  . senna-docusate  2 tablet Oral QHS  . sodium chloride flush  3 mL Intravenous Q12H  . sucralfate  1 g Oral TID WC & HS   Continuous Infusions: . sodium chloride     PRN Meds:.sodium chloride, acetaminophen **OR** acetaminophen, ALPRAZolam, lidocaine, methocarbamol, morphine injection, phenol, sodium chloride flush, traMADol  Diet Orders (From admission, onward)    Start     Ordered   12/17/20 1152  DIET SOFT Room service appropriate? Yes; Fluid consistency: Thin  Diet effective now       Question Answer Comment  Room service appropriate? Yes   Fluid consistency: Thin      12/17/20 1151          DVT prophylaxis:      Code Status: Full Code  Family Communication: Husband present at bedside  Status is: Inpatient  The patient will require care spanning > 2 midnights and should be moved to inpatient because: IV treatments appropriate due to intensity of illness or inability to take PO and Inpatient level of care appropriate due to severity of illness  Dispo: The patient is from: Home  Anticipated d/c is to: Home              Anticipated d/c date is: 1 day              Patient currently is not medically stable to d/c.   Difficult to place patient No   Level of care: Telemetry Medical  Consultants:  GI Neurology   Procedures:  2D echo  Microbiology  none  Antimicrobials: none    Objective: Vitals:   12/17/20 2300 12/18/20 0321 12/18/20 0800 12/18/20 1200  BP: (!) 147/85 136/89 121/77 106/77  Pulse: 65 67 79 79  Resp: 10 14  16 18   Temp: 97.6 F (36.4 C) 98 F (36.7 C) 97.9 F (36.6 C) 98.1 F (36.7 C)  TempSrc: Oral Oral Oral Oral  SpO2: 96% 98% 96% 98%  Weight:      Height:        Intake/Output Summary (Last 24 hours) at 12/18/2020 1356 Last data filed at 12/18/2020 1200 Gross per 24 hour  Intake 976 ml  Output 1100 ml  Net -124 ml   Filed Weights   12/14/20 1435 12/14/20 1922  Weight: 76.2 kg 80.1 kg    Examination:  Constitutional: NAD Eyes: no icterus  ENMT: mmm Neck: normal, supple Respiratory: CTA biL, no wheezing, no crackles Cardiovascular: rrr, no mrg, no edema  Abdomen: soft, nt, nd, bs+ Musculoskeletal: no clubbing / cyanosis.  Skin: no rashes Neurologic: non focal  Data Reviewed: I have independently reviewed following labs and imaging studies   CBC: Recent Labs  Lab 12/14/20 1010 12/14/20 1230 12/15/20 0051 12/16/20 0124 12/17/20 0040  WBC 3.9* 4.3 5.7 4.8 5.7  HGB 12.3 12.6 11.7* 11.8* 12.2  HCT 37.4 39.2 35.4* 35.8* 36.0  MCV 90.1 91.6 90.1 89.9 88.2  PLT 264.0 284 255 261 409   Basic Metabolic Panel: Recent Labs  Lab 12/14/20 1010 12/14/20 1230 12/15/20 0051 12/16/20 0124 12/17/20 0040  NA 142 139 138 136 136  K 4.5 3.9 3.7 3.8 3.4*  CL 105 103 103 101 101  CO2 33* 28 23 28 26   GLUCOSE 90 96 94 92 115*  BUN 12 9 10 7 7   CREATININE 1.07 0.99 0.91 0.94 0.97  CALCIUM 9.4 9.0 8.5* 8.7* 8.6*   Liver Function Tests: No results for input(s): AST, ALT, ALKPHOS, BILITOT, PROT, ALBUMIN in the last 168 hours. Coagulation Profile: No results for input(s): INR, PROTIME in the last 168 hours. HbA1C: No results for input(s): HGBA1C in the last 72 hours. CBG: Recent Labs  Lab 12/14/20 1352  GLUCAP 80    Recent Results (from the past 240 hour(s))  SARS CORONAVIRUS 2 (TAT 6-24 HRS) Nasopharyngeal Nasopharyngeal Swab     Status: None   Collection Time: 12/14/20  2:16 PM   Specimen: Nasopharyngeal Swab  Result Value Ref Range Status   SARS Coronavirus 2  NEGATIVE NEGATIVE Final    Comment: (NOTE) SARS-CoV-2 target nucleic acids are NOT DETECTED.  The SARS-CoV-2 RNA is generally detectable in upper and lower respiratory specimens during the acute phase of infection. Negative results do not preclude SARS-CoV-2 infection, do not rule out co-infections with other pathogens, and should not be used as the sole basis for treatment or other patient management decisions. Negative results must be combined with clinical observations, patient history, and epidemiological information. The expected result is Negative.  Fact Sheet for Patients: SugarRoll.be  Fact Sheet for Healthcare Providers: https://www.woods-mathews.com/  This test is not yet approved or cleared by  the [employer] and  has been authorized for detection and/or diagnosis of SARS-CoV-2 by FDA under an Emergency Use Authorization (EUA). This EUA will remain  in effect (meaning this test can be used) for the duration of the COVID-19 declaration under Se ction 564(b)(1) of the Act, 21 U.S.C. section 360bbb-3(b)(1), unless the authorization is terminated or revoked sooner.  Performed at [facility], [place] [address], [place], [place] [address]   MRSA PCR Screening     Status: None   Collection Time: 12/14/20 11:37 PM   Specimen: Nasopharyngeal  Result Value Ref Range Status   MRSA by PCR NEGATIVE NEGATIVE Final    Comment:        The GeneXpert MRSA Assay (FDA approved for NASAL specimens only), is one component of a comprehensive MRSA colonization surveillance program. It is not intended to diagnose MRSA infection nor to guide or monitor treatment for MRSA infections. Performed at [facility], [place] [address], [place], [place] [address]      Radiology Studies: No results found. Marzetta Board, MD, PhD [facility]  Between 7 am - 7 pm I am available, please contact me via Amion or Securechat  Between  7 pm - 7 am I am not available, please contact night coverage MD/APP via Amion

## 2020-12-19 LAB — BASIC METABOLIC PANEL
Anion gap: 12 (ref 5–15)
BUN: 12 mg/dL (ref 6–20)
CO2: 24 mmol/L (ref 22–32)
Calcium: 8.7 mg/dL — ABNORMAL LOW (ref 8.9–10.3)
Chloride: 101 mmol/L (ref 98–111)
Creatinine, Ser: 1.01 mg/dL — ABNORMAL HIGH (ref 0.44–1.00)
GFR, Estimated: >60 mL/min (ref >60)
Glucose, Bld: 95 mg/dL (ref 70–99)
Potassium: 4.4 mmol/L (ref 3.5–5.1)
Sodium: 137 mmol/L (ref 135–145)

## 2020-12-19 LAB — CBC
HCT: 37.8 % (ref 36.0–46.0)
Hemoglobin: 12.4 g/dL (ref 12.0–15.0)
MCH: 29.9 pg (ref 26.0–34.0)
MCHC: 32.8 g/dL (ref 30.0–36.0)
MCV: 91.1 fL (ref 80.0–100.0)
Platelets: 268 10*3/uL (ref 150–400)
RBC: 4.15 MIL/uL (ref 3.87–5.11)
RDW: 12.9 % (ref 11.5–15.5)
WBC: 5.9 10*3/uL (ref 4.0–10.5)
nRBC: 0 % (ref 0.0–0.2)

## 2020-12-19 MED ORDER — DIPHENHYDRAMINE HCL 25 MG PO CAPS
25.0000 mg | ORAL_CAPSULE | Freq: Four times a day (QID) | ORAL | Status: DC | PRN
  Administered 2020-12-19: 25 mg via ORAL
  Filled 2020-12-19: qty 1

## 2020-12-19 MED ORDER — DIPHENHYDRAMINE HCL 25 MG PO CAPS
25.0000 mg | ORAL_CAPSULE | Freq: Once | ORAL | Status: AC
  Administered 2020-12-19: 25 mg via ORAL
  Filled 2020-12-19: qty 1

## 2020-12-19 NOTE — Evaluation (Signed)
Occupational Therapy Evaluation Patient Details Name: [name] MRN: [id number] DOB: Aug 22, 1961 Today's Date: 12/19/2020    History of Present Illness Pt is a 60 y.o. female with chest pain x about 1 week which felt like food was stuck. Pt told her PCP she had dizziness, defined as room spinning. While MD sent pt for Xray, pt felt like she was going to pass out and did. She was dizzy prior to incident but denied any flashing lights or smells.  Per note from PCP, there is question as to witnessed seizure activity, but patient was not post ictal.  Pt imaging studies negative for acute changes including CT neck, Chest xray.   EGD from 2/20 showed no endoscopic esophageal abnormality to explain patient's dysphagias, but her esophagus was dilated. PMHx of HTN, back pain, spinal stenosis with stimulator implant, right foot drop, HLD, anxiety, and depression.   Clinical Impression   Pt typically mod I for ADL and transfers with Rollator and DME/AE. Pt enjoys aquatic therapy and is very motivated to return to PLOF. Today she demonstrates deficits in activity tolerance, balance, access to LB for ADL, as stated below. She was overall mod A for standing grooming tasks, min to mod A for in room mobility with Rollator. Pt will benefit from skilled OT post-acute at the CIR level. Pt is familiar with program and motivated by intensity and is hopeful to return to PLOF. OT to focus on activity tolerance in standing for ADL as well as access to LB for ADL (donning AFO is good practice)    Follow Up Recommendations  CIR    Equipment Recommendations  None recommended by OT (Pt has appropriate DME)    Recommendations for Other Services       Precautions / Restrictions Precautions Precautions: Fall Required Braces or Orthoses: Other Brace Other Brace: right AFO Restrictions Weight Bearing Restrictions: No      Mobility Bed Mobility               General bed mobility comments: Pt in  chair on arrival and at end of session    Transfers Overall transfer level: Needs assistance Equipment used: 4-wheeled walker Transfers: Sit to/from [employer] Transfers Sit to Stand: Min assist         General transfer comment: Needed min guard assist as for balance. encouraged gaze stabilization.    Balance Overall balance assessment: Needs assistance Sitting-balance support: No upper extremity supported;Feet supported Sitting balance-Leahy Scale: Fair     Standing balance support: Bilateral upper extremity supported;During functional activity Standing balance-Leahy Scale: Poor Standing balance comment: relies on UE and external support for static support as she loses balance and needs mod assist                           ADL either performed or assessed with clinical judgement   ADL Overall ADL's : Needs assistance/impaired Eating/Feeding: Modified independent;Sitting   Grooming: Moderate assistance;Wash/dry hands;Wash/dry face;Oral care;Standing Grooming Details (indicate cue type and reason): increased time and effort, mod A for balance and decreased activity tolerance in standing for grooming activities Upper Body Bathing: Moderate assistance   Lower Body Bathing: Moderate assistance;Sit to/from stand Lower Body Bathing Details (indicate cue type and reason): able to get to knees, increased time and asssit for below the knees - familiar with AE Upper Body Dressing : Min guard   Lower Body Dressing: Moderate assistance;Sitting/lateral leans Lower Body Dressing Details (indicate cue type and  reason): unable to perform figure 4 at this time, and unable to access Bil feet for ADL currently Toilet Transfer: Moderate assistance;RW (Rollator in room) Toilet Transfer Details (indicate cue type and reason): mod A for balance and assist Toileting- Clothing Manipulation and Hygiene: Minimal assistance;Sitting/lateral lean       Functional mobility during  ADLs: Moderate assistance;Rolling walker (Rollator) General ADL Comments: decreased balance, decreased access to BLE for ADL, decreased activity tolerance     Vision Baseline Vision/History: Wears glasses Patient Visual Report: No change from baseline       Perception     Praxis      Pertinent Vitals/Pain Pain Assessment: Faces Faces Pain Scale: Hurts even more Pain Location: back Pain Descriptors / Indicators: Discomfort Pain Intervention(s): Monitored during session;Repositioned;Heat applied (heating pad in chair)     Hand Dominance Right   Extremity/Trunk Assessment         Cervical / Trunk Assessment Cervical / Trunk Assessment: Kyphotic   Communication Communication Communication: No difficulties   Cognition Arousal/Alertness: Awake/alert Behavior During Therapy: WFL for tasks assessed/performed Overall Cognitive Status: Within Functional Limits for tasks assessed                                     General Comments  Educated Pt on Counter pressure strategies/exercises (worksheet left in room) in response to syncopal episode    Exercises     Shoulder Instructions      Home Living Family/patient expects to be discharged to:: Private residence Living Arrangements: Spouse/significant other Available Help at Discharge: Family;Available 24 hours/day Type of Home: House Home Access: Stairs to enter [employer] of Steps: 4 Entrance Stairs-Rails: Right Home Layout: One level     Bathroom Shower/Tub: Occupational psychologist: Standard     Home Equipment: Environmental consultant - 2 wheels;Cane - single point;Bedside commode;Shower seat;Walker - 4 wheels;Adaptive equipment;Wheelchair - Diplomatic Services operational officer Equipment: Reacher;Long-handled sponge        Prior Functioning/Environment Level of Independence: Independent with assistive device(s)        Comments: uses rollator due to foot drop. Was seeing Guido Sander at [place] [facility] PTA per pt  (aquatics therapy)        OT Problem List: Decreased activity tolerance;Impaired balance (sitting and/or standing);Decreased safety awareness;Decreased knowledge of use of DME or AE;Decreased knowledge of precautions;Pain      OT Treatment/Interventions: Self-care/ADL training;Therapeutic exercise;DME and/or AE instruction;Therapeutic activities;Patient/family education;Balance training    OT Goals(Current goals can be found in the care plan section) Acute Rehab OT Goals Patient Stated Goal: to go to CIR and then home OT Goal Formulation: With patient Time For Goal Achievement: 01/02/21 Potential to Achieve Goals: Good  OT Frequency: Min 2X/week   Barriers to D/C:            Co-evaluation              AM-PAC OT "6 Clicks" Daily Activity     Outcome Measure Help from another person eating meals?: None Help from another person taking care of personal grooming?: A Lot Help from another person toileting, which includes using toliet, bedpan, or urinal?: A Lot Help from another person bathing (including washing, rinsing, drying)?: A Lot Help from another person to put on and taking off regular upper body clothing?: A Little Help from another person to put on and taking off regular lower body clothing?: A Lot 6 Click Score: 15  End of Session Equipment Utilized During Treatment: Gait belt Agricultural consultant) Nurse Communication: Mobility status;Precautions  Activity Tolerance: Patient tolerated treatment well Patient left: in chair;with call bell/phone within reach;with chair alarm set  OT Visit Diagnosis: Unsteadiness on feet (R26.81);Other abnormalities of gait and mobility (R26.89);History of falling (Z91.81)                Time: 5498-2641 OT Time Calculation (min): 24 min Charges:  OT General Charges $OT Visit: 1 Visit OT Evaluation $OT Eval Moderate Complexity: 1 Mod OT Treatments $Self Care/Home Management : 8-22 mins  Jesse Sans OTR/L Acute Rehabilitation  Services Pager: [phone] Office: [place] 12/19/2020, 3:14 PM

## 2020-12-19 NOTE — Progress Notes (Signed)
Physical Therapy Treatment Patient Details Name: [name] MRN: [id number] DOB: 1961-06-17 Today's Date: 12/19/2020    History of Present Illness Pt is a 60 y.o. female with chest pain x about 1 week which felt like food was stuck. Pt told her PCP she had dizziness, defined as room spinning. While MD sent pt for Xray, pt felt like she was going to pass out and did. She was dizzy prior to incident but denied any flashing lights or smells.  Per note from PCP, there is question as to witnessed seizure activity, but patient was not post ictal.  Pt imaging studies negative for acute changes including CT neck, Chest xray.   EGD from 2/20 showed no endoscopic esophageal abnormality to explain patient's dysphagias, but her esophagus was dilated. PMHx of HTN, back pain, spinal stenosis with stimulator implant, right foot drop, HLD, anxiety, and depression.    PT Comments    Pt admitted with above diagnosis. Pt was able to ambulate with a little further distance today but still limited by dizziness.  Pt at times needs mod assist for stability due to imbalance due to vertigo.  Pt continues to work on x 1 exercises and can perform for longer periods of time each rep.  Hopeful that pt can continue vestibular therapy  On Rehab to maximize function to return home at a more functional level. Will continue to follow acutely.  Pt currently with functional limitations due to balance and endurance deficits. Pt will benefit from skilled PT to increase their independence and safety with mobility to allow discharge to the venue listed below.     Follow Up Recommendations  CIR;Supervision - Intermittent (If CIR does not take pt and pt refuses SNF, home with HHPT,HHOT, [place] aide.)     Equipment Recommendations  None recommended by PT    Recommendations for Other Services Rehab consult     Precautions / Restrictions Precautions Precautions: Fall Required Braces or Orthoses: Other Brace (right  AFO) Restrictions Weight Bearing Restrictions: No    Mobility  Bed Mobility               General bed mobility comments: Pt in chair on arrival.    Transfers Overall transfer level: Needs assistance Equipment used: 4-wheeled walker Transfers: Sit to/from [facility] Sit to Stand: Min assist         General transfer comment: Needed min guard assist as she was still a little dizzy. Encouraged pt to focus on object as pt tends to just close her eyes.  Ambulation/Gait Ambulation/Gait assistance: Min guard;Min assist;Mod assist Gait Distance (Feet): 70 Feet (40 feet then 30 feet) Assistive device: 4-wheeled walker Gait Pattern/deviations: Step-through pattern;Decreased step length - right;Decreased dorsiflexion - right;Antalgic;Staggering right;Wide base of support   Gait velocity interpretation: <1.31 ft/sec, indicative of household ambulator General Gait Details: Pt was able to walk today with rollator with min assist to  mod assist with LOB due to dizziness.  Pt did have two LOB where she grabbed therapist arm for stability once and grabbed door frame due to dizziness. Pt continues to have periods of dizziness due to vertigo.  Pt is using compensatory strategies a little better each day but needs cues for this.  She tends to close eyes which makes the vertigo worse.   Stairs             Wheelchair Mobility    Modified Rankin (Stroke Patients Only)       Balance Overall balance assessment: Needs  assistance Sitting-balance support: No upper extremity supported;Feet supported Sitting balance-Leahy Scale: Fair     Standing balance support: Bilateral upper extremity supported;During functional activity Standing balance-Leahy Scale: Poor Standing balance comment: relies on UE and external support for static support as she loses balance and needs mod assist                            Cognition Arousal/Alertness: Awake/alert Behavior  During Therapy: WFL for tasks assessed/performed Overall Cognitive Status: Within Functional Limits for tasks assessed                                        Exercises General Exercises - Lower Extremity Long Arc Quad: AROM;Both;10 reps;Seated Hip Flexion/Marching: AROM;Both;10 reps;Seated Antenatal Exercises Ankle Circles/Pumps: AROM;Both;10 reps;Seated Quad Sets: AROM;Both;10 reps;Seated Gluteal Sets: AROM;Both;10 reps;Seated Other Exercises Other Exercises: x 1 exercises performed    General Comments General comments (skin integrity, edema, etc.): Dizziness 5/10 iniitally per pt better than yesterday but still present. Pt continues to  have left hypofunction per testing with pt continuing to perform x 1 exercises as well and pt to perform 3x - 5 x day.  Pt understands.      Pertinent Vitals/Pain Pain Assessment: Faces Faces Pain Scale: Hurts even more Pain Location: back Pain Descriptors / Indicators: Discomfort Pain Intervention(s): Limited activity within patient's tolerance;Monitored during session;Repositioned    Home Living               Home Equipment: Walker - 2 wheels;Cane - single point;Bedside commode;Shower seat;Walker - 4 wheels;Adaptive equipment;Wheelchair - manual      Prior Function            PT Goals (current goals can now be found in the care plan section) Acute Rehab PT Goals Patient Stated Goal: to go home Progress towards PT goals: Progressing toward goals    Frequency    Min 3X/week      PT Plan Current plan remains appropriate    Co-evaluation              AM-PAC PT "6 Clicks" Mobility   Outcome Measure  Help needed turning from your back to your side while in a flat bed without using bedrails?: None Help needed moving from lying on your back to sitting on the side of a flat bed without using bedrails?: A Little Help needed moving to and from a bed to a chair (including a wheelchair)?: A Lot Help needed  standing up from a chair using your arms (e.g., wheelchair or bedside chair)?: A Lot Help needed to walk in hospital room?: A Lot Help needed climbing 3-5 steps with a railing? : A Lot 6 Click Score: 15    End of Session Equipment Utilized During Treatment: Gait belt Activity Tolerance: Patient limited by fatigue (limited by dizziness) Patient left: in chair;with call bell/phone within reach Nurse Communication: Mobility status PT Visit Diagnosis: Unsteadiness on feet (R26.81);Muscle weakness (generalized) (M62.81)     Time: 2423-5361 PT Time Calculation (min) (ACUTE ONLY): 38 min  Charges:  $Gait Training: 8-22 mins $Therapeutic Exercise: 23-37 mins                     Nazar Kuan W,PT Acute Rehabilitation Services Pager:  [phone]  Office:  [place] 12/19/2020, 10:47 AM

## 2020-12-19 NOTE — Progress Notes (Signed)
PROGRESS NOTE  [name] PYP:[id number] DOB: [birth year] DOA: 12/14/2020 PCP: [name], [name], MD  HPI/Recap of past 41 hours: 60 year old female with history of hypertension, chronic lower back pain status post spinal stimulator, comes to the hospital with complaints of syncope.  Over the last week, patient has been complaining of significant solid food dysphagia associated with chest pain, getting very scared at times, and overall feeling very weak, lightheaded and dizzy at times.  She also reports new onset of right arm tremors.  She had to make significant changes in her diet, changed to soft/pure and liquids as she tolerates better these foods.  She went to see her PCP the other day, who sent her for an x-ray and while getting imaging she passed out.  12/19/20: She reports persistent dizziness this morning.  Also mild nausea with no vomiting.  Reports chest pain last night, improved with IV morphine.  Assessment/Plan: Principal Problem:   Postural dizziness with presyncope Active Problems:   Chest pain   Essential hypertension   Anxiety disorder, unspecified   Chronic pain   Lumbar post-laminectomy syndrome   Dysphagia   Odynophagia  Dizziness, syncope-possible BPPV-unclear etiology, neurology consulted and appreciate input.  We are unable to do an MRI due to her spinal implant stimulator.  Neurology suspects BPPV possibly as a consequence of the fall and mild head injury, but cannot completely exclude a small stroke.  EEG done on 2/19 and within normal limits without seizures or epileptiform discharges -She was again reevaluated by PT today with recommendation for CIR.  Dysphagia, chest pain-this has been relatively sudden about a week ago, has been persistent and her chest pain is related to eating.  Her troponin is negative making ACS very unlikely given constant chest pain for the past week which is worse with food.  GI consulted, had an EGD on 2/20 without  significant findings. Chest pain seems positional, likely MSK She had another episode of chest pain last night, improved with IV morphine.  Possible hypertrophic cardiomyopathy-on 2D echo done on 2/19, cardiology consulted, appreciate input, continue beta-blocker, outpatient follow-up  Chronic back pain -Spinal stimulator in place, continue home medications  Hyperlipidemia -Continue atorvastatin  Hypertension BP is currently at goal. -continue Coreg  Scheduled Meds: . atorvastatin  20 mg Oral Daily  . carvedilol  12.5 mg Oral BID WC  . DULoxetine  30 mg Oral Daily  . gabapentin  400 mg Oral TID  . pantoprazole  40 mg Oral BID AC  . polyethylene glycol  17 g Oral Daily  . senna-docusate  2 tablet Oral QHS  . sodium chloride flush  3 mL Intravenous Q12H  . sucralfate  1 g Oral TID WC & HS   Continuous Infusions: . sodium chloride     PRN Meds:.sodium chloride, acetaminophen **OR** acetaminophen, ALPRAZolam, lidocaine, methocarbamol, morphine injection, phenol, sodium chloride flush, traMADol     Diet Orders (From admission, onward)           Start     Ordered    12/17/20 1152  DIET SOFT Room service appropriate? Yes; Fluid consistency: Thin  Diet effective now       Question Answer Comment  Room service appropriate? Yes   Fluid consistency: Thin      12/17/20 1151              Code Status: Full code.  Family Communication: None at bedside  Disposition Plan: Likely will DC to home on 12/20/2020 with home health  services or CIR.  Consultants:  Neurology.  Procedures: 2D echo.  Antimicrobials:  None.  DVT prophylaxis: SCDs.  Status is: Inpatient    Dispo: The patient is from: Home.               Anticipated d/c is to: Home with home health services for CIR.               Anticipated d/c date is: 12/20/2020.              Patient currently not stable for discharge due to ongoing, persistent dizziness.  Making an unsafe discharge.    Difficult to place patient, not applicable.        Objective: Vitals:   12/18/20 1200 12/18/20 1727 12/19/20 0001 12/19/20 0807  BP: 106/77 121/78 131/80 135/73  Pulse: 79 73 72 74  Resp: 18 18 14 14   Temp: 98.1 F (36.7 C) 98.1 F (36.7 C) 98.2 F (36.8 C)   TempSrc: Oral  Oral   SpO2: 98%  95% 97%  Weight:      Height:        Intake/Output Summary (Last 24 hours) at 12/19/2020 1252 Last data filed at 12/19/2020 1150 Gross per 24 hour  Intake --  Output 750 ml  Net -750 ml   Filed Weights   12/14/20 1435 12/14/20 1922  Weight: 76.2 kg 80.1 kg    Exam:  . General: 60 y.o. year-old female well developed well nourished in no acute distress.  Alert and oriented x3. . Cardiovascular: Regular rate and rhythm with no rubs or gallops.  No thyromegaly or JVD noted.   [name] Respiratory: Clear to auscultation with no wheezes or rales. Good inspiratory effort. . Abdomen: Soft nontender nondistended with normal bowel sounds x4 quadrants. . Musculoskeletal: No lower extremity edema. 2/4 pulses in all 4 extremities. . Skin: No ulcerative lesions noted or rashes, . Psychiatry: Mood is appropriate for condition and setting   Data Reviewed: CBC: Recent Labs  Lab 12/14/20 1230 12/15/20 0051 12/16/20 0124 12/17/20 0040 12/19/20 0221  WBC 4.3 5.7 4.8 5.7 5.9  HGB 12.6 11.7* 11.8* 12.2 12.4  HCT 39.2 35.4* 35.8* 36.0 37.8  MCV 91.6 90.1 89.9 88.2 91.1  PLT 284 255 261 272 509   Basic Metabolic Panel: Recent Labs  Lab 12/14/20 1230 12/15/20 0051 12/16/20 0124 12/17/20 0040 12/19/20 0221  NA 139 138 136 136 137  K 3.9 3.7 3.8 3.4* 4.4  CL 103 103 101 101 101  CO2 28 23 28 26 24   GLUCOSE 96 94 92 115* 95  BUN 9 10 7 7 12   CREATININE 0.99 0.91 0.94 0.97 1.01*  CALCIUM 9.0 8.5* 8.7* 8.6* 8.7*   GFR: Estimated Creatinine Clearance: 62.7 mL/min (A) (by C-G formula based on SCr of 1.01 mg/dL (H)). Liver Function Tests: No results for input(s): AST, ALT, ALKPHOS,  BILITOT, PROT, ALBUMIN in the last 168 hours. No results for input(s): LIPASE, AMYLASE in the last 168 hours. No results for input(s): AMMONIA in the last 168 hours. Coagulation Profile: No results for input(s): INR, PROTIME in the last 168 hours. Cardiac Enzymes: No results for input(s): CKTOTAL, CKMB, CKMBINDEX, TROPONINI in the last 168 hours. BNP (last 3 results) No results for input(s): PROBNP in the last 8760 hours. HbA1C: No results for input(s): HGBA1C in the last 72 hours. CBG: Recent Labs  Lab 12/14/20 1352  GLUCAP 80   Lipid Profile: No results for input(s): CHOL, HDL, LDLCALC, TRIG, CHOLHDL, LDLDIRECT in  the last 72 hours. Thyroid Function Tests: No results for input(s): TSH, T4TOTAL, FREET4, T3FREE, THYROIDAB in the last 72 hours. Anemia Panel: No results for input(s): VITAMINB12, FOLATE, FERRITIN, TIBC, IRON, RETICCTPCT in the last 72 hours. Urine analysis:    Component Value Date/Time   COLORURINE YELLOW 09/23/2019 1207   APPEARANCEUR HAZY (A) 09/23/2019 1207   LABSPEC 1.023 09/23/2019 1207   PHURINE 5.0 09/23/2019 [place] 09/23/2019 1207   HGBUR NEGATIVE 09/23/2019 1207   [place] 09/23/2019 [place] 09/23/2019 1207   PROTEINUR NEGATIVE 09/23/2019 1207   UROBILINOGEN 0.2 04/24/2009 1600   NITRITE NEGATIVE 09/23/2019 1207   LEUKOCYTESUR NEGATIVE 09/23/2019 1207   Sepsis Labs: @LABRCNTIP (procalcitonin:4,lacticidven:4)  ) Recent Results (from the past 240 hour(s))  SARS CORONAVIRUS 2 (TAT 6-24 HRS) Nasopharyngeal Nasopharyngeal Swab     Status: None   Collection Time: 12/14/20  2:16 PM   Specimen: Nasopharyngeal Swab  Result Value Ref Range Status   SARS Coronavirus 2 NEGATIVE NEGATIVE Final    Comment: (NOTE) SARS-CoV-2 target nucleic acids are NOT DETECTED.  The SARS-CoV-2 RNA is generally detectable in upper and lower respiratory specimens during the acute phase of infection. Negative results do not  preclude SARS-CoV-2 infection, do not rule out co-infections with other pathogens, and should not be used as the sole basis for treatment or other patient management decisions. Negative results must be combined with clinical observations, patient history, and epidemiological information. The expected result is Negative.  Fact Sheet for Patients: SugarRoll.be  Fact Sheet for Healthcare Providers: https://www.woods-mathews.com/  This test is not yet approved or cleared by the [place] FDA and  has been authorized for detection and/or diagnosis of SARS-CoV-2 by FDA under an Emergency Use Authorization (EUA). This EUA will remain  in effect (meaning this test can be used) for the duration of the COVID-19 declaration under Se ction 564(b)(1) of the Act, 21 U.S.C. section 360bbb-3(b)(1), unless the authorization is terminated or revoked sooner.  Performed at [facility], [place] [address], [place], [place] [address]   MRSA PCR Screening     Status: None   Collection Time: 12/14/20 11:37 PM   Specimen: Nasopharyngeal  Result Value Ref Range Status   MRSA by PCR NEGATIVE NEGATIVE Final    Comment:        The GeneXpert MRSA Assay (FDA approved for NASAL specimens only), is one component of a comprehensive MRSA colonization surveillance program. It is not intended to diagnose MRSA infection nor to guide or monitor treatment for MRSA infections. Performed at [facility], [place] [address], [place], [place] [address]       Studies: No results found.  Scheduled Meds: . atorvastatin  20 mg Oral Daily  . carvedilol  12.5 mg Oral BID WC  . DULoxetine  30 mg Oral Daily  . gabapentin  400 mg Oral TID  . pantoprazole  40 mg Oral BID AC  . polyethylene glycol  17 g Oral Daily  . senna-docusate  2 tablet Oral QHS  . sodium chloride flush  3 mL Intravenous Q12H  . sucralfate  1 g Oral TID WC & HS    Continuous Infusions: .  sodium chloride       LOS: 4 days     Kayleen Memos, MD [facility] Pager [phone]  If 7PM-7AM, please contact night-coverage [email or web address] Password [facility] 12/19/2020, 12:52 PM

## 2020-12-19 NOTE — Plan of Care (Signed)

## 2020-12-20 ENCOUNTER — Other Ambulatory Visit (HOSPITAL_COMMUNITY): Payer: Self-pay | Admitting: Internal Medicine

## 2020-12-20 MED ORDER — MECLIZINE HCL 25 MG PO TABS
25.0000 mg | ORAL_TABLET | Freq: Two times a day (BID) | ORAL | Status: DC | PRN
  Filled 2020-12-20: qty 1

## 2020-12-20 MED ORDER — CARVEDILOL 12.5 MG PO TABS: 12.5000 mg | ORAL_TABLET | Freq: Two times a day (BID) | ORAL | 0 refills | Status: DC

## 2020-12-20 MED ORDER — MECLIZINE HCL 25 MG PO TABS: 25.0000 mg | ORAL_TABLET | Freq: Two times a day (BID) | ORAL | 0 refills | Status: DC | PRN

## 2020-12-20 MED FILL — CARVEDILOL 12.5 MG TABLET: 12.5 | 90 days supply | Qty: 180 | Fill #0

## 2020-12-20 MED FILL — MECLIZINE HCL 25 MG TABS: 25 | 10 days supply | Qty: 20 | Fill #0

## 2020-12-20 NOTE — Discharge Instructions (Signed)
Benign Positional Vertigo Vertigo is the feeling that you or your surroundings are moving when they are not. Benign positional vertigo is the most common form of vertigo. This is usually a harmless condition (benign). This condition is positional. This means that symptoms are triggered by certain movements and positions. This condition can be dangerous if it occurs while you are doing something that could cause harm to you or others. This includes activities such as driving or operating machinery. What are the causes? The inner ear has fluid-filled canals that help your brain sense movement and balance. When the fluid moves, the brain receives messages about your body's position. With benign positional vertigo, crystals in the inner ear break free and disturb the inner ear area. This causes your brain to receive confusing messages about your body's position. What increases the risk? You are more likely to develop this condition if:  You are a woman.  You are 50 years of age or older.  You have recently had a head injury.  You have an inner ear disease. What are the signs or symptoms? Symptoms of this condition usually happen when you move your head or your eyes in different directions. Symptoms may start suddenly, and usually last for less than a minute. They include:  Loss of balance and falling.  Feeling like you are spinning or moving.  Feeling like your surroundings are spinning or moving.  Nausea and vomiting.  Blurred vision.  Dizziness.  Involuntary eye movement (nystagmus). Symptoms can be mild and cause only minor problems, or they can be severe and interfere with daily life. Episodes of benign positional vertigo may return (recur) over time. Symptoms may improve over time. How is this diagnosed? This condition may be diagnosed based on:  Your medical history.  Physical exam of the head, neck, and ears.  Positional tests to check for or stimulate vertigo. You may be  asked to turn your head and change positions, such as going from sitting to lying down. A health care provider will watch for symptoms of vertigo. You may be referred to a health care provider who specializes in ear, nose, and throat problems (ENT, or otolaryngologist) or a provider who specializes in disorders of the nervous system (neurologist). How is this treated? This condition may be treated in a session in which your health care provider moves your head in specific positions to help the displaced crystals in your inner ear move. Treatment for this condition may take several sessions. Surgery may be needed in severe cases, but this is rare. In some cases, benign positional vertigo may resolve on its own in 2-4 weeks.   Follow these instructions at home: Safety  Move slowly. Avoid sudden body or head movements or certain positions, as told by your health care provider.  Avoid driving until your health care provider says it is safe for you to do so.  Avoid operating heavy machinery until your health care provider says it is safe for you to do so.  Avoid doing any tasks that would be dangerous to you or others if vertigo occurs.  If you have trouble walking or keeping your balance, try using a cane for stability. If you feel dizzy or unstable, sit down right away.  Return to your normal activities as told by your health care provider. Ask your health care provider what activities are safe for you. General instructions  Take over-the-counter and prescription medicines only as told by your health care provider.  Drink enough fluid   to keep your urine pale yellow.  Keep all follow-up visits as told by your health care provider. This is important. Contact a health care provider if:  You have a fever.  Your condition gets worse or you develop new symptoms.  Your family or friends notice any behavioral changes.  You have nausea or vomiting that gets worse.  You have numbness or a  prickling and tingling sensation. Get help right away if you:  Have difficulty speaking or moving.  Are always dizzy.  Faint.  Develop severe headaches.  Have weakness in your legs or arms.  Have changes in your hearing or vision.  Develop a stiff neck.  Develop sensitivity to light. Summary  Vertigo is the feeling that you or your surroundings are moving when they are not. Benign positional vertigo is the most common form of vertigo.  This condition is caused by crystals in the inner ear that become displaced. This causes a disturbance in an area of the inner ear that helps your brain sense movement and balance.  Symptoms include loss of balance and falling, feeling that you or your surroundings are moving, nausea and vomiting, and blurred vision.  This condition can be diagnosed based on symptoms, a physical exam, and positional tests.  Follow safety instructions as told by your health care provider. You will also be told when to contact your health care provider in case of problems. This information is not intended to replace advice given to you by your health care provider. Make sure you discuss any questions you have with your health care provider. Document Revised: 09/06/2019 Document Reviewed: 03/24/2018 Elsevier Patient Education  2021 [place].   Dizziness Dizziness is a common problem. It makes you feel unsteady or light-headed. You may feel like you are about to pass out (faint). Dizziness can lead to getting hurt if you stumble or fall. Dizziness can be caused by many things, including:  Medicines.  Not having enough water in your body (dehydration).  Illness. Follow these instructions at home: Eating and drinking  Drink enough fluid to keep your pee (urine) clear or pale yellow. This helps to keep you from getting dehydrated. Try to drink more clear fluids, such as water.  Do not drink alcohol.  Limit how much caffeine you drink or eat, if your  doctor tells you to do that.  Limit how much salt (sodium) you drink or eat, if your doctor tells you to do that.   Activity  Avoid making quick movements. ? When you stand up from sitting in a chair, steady yourself until you feel okay. ? In the morning, first sit up on the side of the bed. When you feel okay, stand slowly while you hold onto something. Do this until you know that your balance is fine.  If you need to stand in one place for a long time, move your legs often. Tighten and relax the muscles in your legs while you are standing.  Do not drive or use heavy machinery if you feel dizzy.  Avoid bending down if you feel dizzy. Place items in your home so you can reach them easily without leaning over.   Lifestyle  Do not use any products that contain nicotine or tobacco, such as cigarettes and e-cigarettes. If you need help quitting, ask your doctor.  Try to lower your stress level. You can do this by using methods such as yoga or meditation. Talk with your doctor if you need help. General instructions  Watch your dizziness for any changes.  Take over-the-counter and prescription medicines only as told by your doctor. Talk with your doctor if you think that you are dizzy because of a medicine that you are taking.  Tell a friend or a family member that you are feeling dizzy. If he or she notices any changes in your behavior, have this person call your doctor.  Keep all follow-up visits as told by your doctor. This is important. Contact a doctor if:  Your dizziness does not go away.  Your dizziness or light-headedness gets worse.  You feel sick to your stomach (nauseous).  You have trouble hearing.  You have new symptoms.  You are unsteady on your feet.  You feel like the room is spinning. Get help right away if:  You throw up (vomit) or have watery poop (diarrhea), and you cannot eat or drink anything.  You have  trouble: ? Talking. ? Walking. ? Swallowing. ? Using your arms, hands, or legs.  You feel generally weak.  You are not thinking clearly, or you have trouble forming sentences. A friend or family member may notice this.  You have: ? Chest pain. ? Pain in your belly (abdomen). ? Shortness of breath. ? Sweating.  Your vision changes.  You are bleeding.  You have a very bad headache.  You have neck pain or a stiff neck.  You have a fever. These symptoms may be an emergency. Do not wait to see if the symptoms will go away. Get medical help right away. Call your local emergency services (911 in the [place] Do not drive yourself to the hospital. Summary  Dizziness makes you feel unsteady or light-headed. You may feel like you are about to pass out (faint).  Drink enough fluid to keep your pee (urine) clear or pale yellow. Do not drink alcohol.  Avoid making quick movements if you feel dizzy.  Watch your dizziness for any changes. This information is not intended to replace advice given to you by your health care provider. Make sure you discuss any questions you have with your health care provider. Document Revised: 07/04/2020 Document Reviewed: 10/30/2016 Elsevier Patient Education  [place].

## 2020-12-20 NOTE — TOC Transition Note (Signed)
Transition of Care [facility]) - CM/SW Discharge Note   Patient Details  Name: [name] MRN: [id number] Date of Birth: [birth year]  Transition of Care [facility]) CM/SW Contact:  Zenon Mayo, RN Phone Number: 12/20/2020, 12:37 PM   Clinical Narrative:    Patient is for dc today, NCM made referral to [facility] with Alvis Lemmings, he states he can take referral.  Soc will begin 24 to 48 hrs post dc.   Final next level of care: [place] Barriers to Discharge: No Barriers Identified   Patient Goals and CMS Choice Patient states their goals for this hospitalization and ongoing recovery are:: home with [facility] CMS Medicare.gov Compare Post Acute Care list provided to:: Patient Choice offered to / list presented to : Patient  Discharge Placement                       Discharge Plan and Services                  DME Agency: NA       HH Arranged: PT,OT [place] Agency: [place] Date [place]: 12/20/20 Time [place]: 4332 Representative spoke with at [place]: [place] ([place]) Interventions     Readmission Risk Interventions No flowsheet data found.

## 2020-12-20 NOTE — Progress Notes (Signed)
Physical Therapy Treatment Patient Details Name: [name] MRN: [id number] DOB: 1960-12-21 Today's Date: 12/20/2020    History of Present Illness Pt is a 60 y.o. female with chest pain x about 1 week which felt like food was stuck. Pt told her PCP she had dizziness, defined as room spinning. While MD sent pt for Xray, pt felt like she was going to pass out and did. She was dizzy prior to incident but denied any flashing lights or smells.  Per note from PCP, there is question as to witnessed seizure activity, but patient was not post ictal.  Pt imaging studies negative for acute changes including CT neck, Chest xray.   EGD from 2/20 showed no endoscopic esophageal abnormality to explain patient's dysphagias, but her esophagus was dilated. PMHx of HTN, back pain, spinal stenosis with stimulator implant, right foot drop, HLD, anxiety, and depression.    PT Comments    Pt admitted with above diagnosis. CIR denied pt for admission.  Updated MD and CM.  Pt refusing SNF for therapy therefore option is home with HHPT and HHOT.  Pt states she will manage but is upset that CIR would not take her for vestibular therapy.  Today, pt states she just wanted to practice x 1 exercises to save her energy to go home.  We reviewed the exercises. Pt progressing with these. Pt made aware that for safety she should limit her walking in the home and use the rollator to roll around on vs a wheelchair.  She also is aware to use the 3N1 in bedroom or living room when husband isnt there for safety.  Pt currently with functional limitations due to balance and endurance deficits. Pt will benefit from skilled PT to increase their independence and safety with mobility to allow discharge to the venue listed below.     Follow Up Recommendations  Supervision - Intermittent;Home health PT (HHOT) for Vestibular therapy     Equipment Recommendations  None recommended by PT    Recommendations for Other Services        Precautions / Restrictions Precautions Precautions: Fall Required Braces or Orthoses: Other Brace Other Brace: right AFO Restrictions Weight Bearing Restrictions: No    Mobility  Bed Mobility       Sidelying to sit: Min assist       General bed mobility comments: Pt in chair on arrival and at end of session    Transfers                    Ambulation/Gait                 Stairs             Wheelchair Mobility    Modified Rankin (Stroke Patients Only)       Balance                                            Cognition Arousal/Alertness: Awake/alert Behavior During Therapy: WFL for tasks assessed/performed Overall Cognitive Status: Within Functional Limits for tasks assessed                                        Exercises Other Exercises Other Exercises: x 1 exercises performed with pt with good technique and  reports that her dizziness continues to improve daily    General Comments        Pertinent Vitals/Pain Pain Assessment: Faces Faces Pain Scale: Hurts even more Pain Location: back Pain Descriptors / Indicators: Discomfort Pain Intervention(s): Limited activity within patient's tolerance;Monitored during session;Repositioned    Home Living                      Prior Function            PT Goals (current goals can now be found in the care plan section) Acute Rehab PT Goals Patient Stated Goal: to go to CIR and then home Progress towards PT goals: Progressing toward goals    Frequency    Min 3X/week      PT Plan Discharge plan needs to be updated    Co-evaluation              AM-PAC PT "6 Clicks" Mobility   Outcome Measure  Help needed turning from your back to your side while in a flat bed without using bedrails?: None Help needed moving from lying on your back to sitting on the side of a flat bed without using bedrails?: A Little Help needed moving to and  from a bed to a chair (including a wheelchair)?: A Lot Help needed standing up from a chair using your arms (e.g., wheelchair or bedside chair)?: A Lot Help needed to walk in hospital room?: A Lot Help needed climbing 3-5 steps with a railing? : A Lot 6 Click Score: 15    End of Session   Activity Tolerance: Patient limited by fatigue (limited by dizziness) Patient left: in chair;with call bell/phone within reach Nurse Communication: Mobility status PT Visit Diagnosis: Unsteadiness on feet (R26.81);Muscle weakness (generalized) (M62.81)     Time: 1610-9604 PT Time Calculation (min) (ACUTE ONLY): 25 min  Charges:  $Therapeutic Exercise: 23-37 mins                     Calvert Charland W,PT Acute Rehabilitation Services Pager:  [phone]  Office:  [place] 12/20/2020, 12:10 PM

## 2020-12-20 NOTE — Discharge Summary (Signed)
Discharge Summary  [name] HCW:[id number] DOB: 12-24-1960  PCP: Cheryl, [name], MD  Admit date: 12/14/2020 Discharge date: 12/20/2020  Time spent: 35 minutes.  Recommendations for Outpatient Follow-up:  1. Continue vestibular therapy at home. 2. Follow-up with your cardiology in 1 to 2 weeks 3. Follow-up with your primary care provider in 1 to 2 weeks 4. Take your medications as prescribed.  Discharge Diagnoses:  Active Hospital Problems   Diagnosis Date Noted   Postural dizziness with presyncope 12/14/2020   Odynophagia    Dysphagia 12/15/2020   Lumbar post-laminectomy syndrome    Chronic pain 09/28/2019   Anxiety disorder, unspecified 04/16/2018   Essential hypertension    Chest pain 04/16/2015    Resolved Hospital Problems  No resolved problems to display.    Discharge Condition: Stable.  Diet recommendation: Resume previous diet.  Vitals:   12/20/20 0303 12/20/20 0914  BP: 131/77 137/75  Pulse: 66   Resp: 11 14  Temp: 97.9 F (36.6 C) 98.2 F (36.8 C)  SpO2: 96%     History of present illness:  60 year old female with history of hypertension, chronic lower back pain status post spinal stimulator, comes to the hospital with complaints of syncope. Over the last week, patient has been complaining of significant solid food dysphagia associated with chest pain, getting very scared at times, and overall feeling very weak, lightheaded and dizzy at times. She also reports new onset of right arm tremors. She had to make significant changes in her diet, changed to soft/pure and liquids as she tolerates better these foods. She went to see her PCP the other day, who sent her for an x-ray and while getting imaging she passed out.  12/20/20:  Seen and examined.  Only complaint is of dizziness which appears to be improved.  Vital signs and lab reviewed and are stable.  Seen by PT OT with recommendation for vestibular therapy at home.  Hospital  Course:  Principal Problem:   Postural dizziness with presyncope Active Problems:   Chest pain   Essential hypertension   Anxiety disorder, unspecified   Chronic pain   Lumbar post-laminectomy syndrome   Dysphagia   Odynophagia  Dizziness, syncope- BPPV Neurology consulted and appreciate input.  Unable to do an MRI brain due to her spinal implant stimulator.  Neurology suspects BPPV possibly as a consequence of the fall and mild head injury, but cannot completely exclude a small stroke.  EEG done on 2/19 and within normal limits without seizures or epileptiform discharges. Seen by PT OT with recommendation for vestibular therapy at home.  Dysphagia, chest pain- This has been relatively sudden about a week ago Her chest pain is related to eating.  Her troponin is negative making ACS very unlikely given constant chest pain for the past week which is worse with food.  No evidence of acute ischemia on twelve-lead EKG. GI consulted, had an EGD on 12/16/20 without significant findings.  Biopsy taken on 12/16/2020 with EGD revealed benign squamous mucosa, no increase in intraepithelial eosinophils, no interstitial metaplasia, dysplasia or malignancy.  Possible hypertrophic cardiomyopathy- On 2D echo done on 2/19, cardiology consulted, appreciate input, continue beta-blocker, outpatient follow-up  Chronic back pain -Spinal stimulator in place, continue home medications  Hyperlipidemia -Continue atorvastatin  Hypertension BP is currently at goal. -continue Coreg    Code Status: Full code.   Consultants:  Neurology.  Cardiology  GI.  Procedures: 2D echo. EGD.    Discharge Exam: BP 137/75 (BP Location: Right Arm)  Pulse 66    Temp 98.2 F (36.8 C) (Oral)    Resp 14    Ht 5\' 5"  (1.651 m)    Wt 80.1 kg    SpO2 96%    BMI 29.39 kg/m   General: 60 y.o. year-old female well developed well nourished in no acute distress.  Alert and oriented  x3.  Cardiovascular: Regular rate and rhythm with no rubs or gallops.  No thyromegaly or JVD noted.    Respiratory: Clear to auscultation with no wheezes or rales. Good inspiratory effort.  Abdomen: Soft nontender nondistended with normal bowel sounds x4 quadrants.  Musculoskeletal: No lower extremity edema. 2/4 pulses in all 4 extremities.  Skin: No ulcerative lesions noted or rashes,  Psychiatry: Mood is appropriate for condition and setting  Discharge Instructions You were cared for by a hospitalist during your hospital stay. If you have any questions about your discharge medications or the care you received while you were in the hospital after you are discharged, you can call the unit and asked to speak with the hospitalist on call if the hospitalist that took care of you is not available. Once you are discharged, your primary care physician will handle any further medical issues. Please note that NO REFILLS for any discharge medications will be authorized once you are discharged, as it is imperative that you return to your primary care physician (or establish a relationship with a primary care physician if you do not have one) for your aftercare needs so that they can reassess your need for medications and monitor your lab values.   Allergies as of 12/20/2020      Reactions   Cephalosporins Anaphylaxis   Penicillins Anaphylaxis, Hives   Did it involve swelling of the face/tongue/throat, SOB, or low BP? Yes Did it involve sudden or severe rash/hives, skin peeling, or any reaction on the inside of your mouth or nose? Yes Did you need to seek medical attention at a hospital or doctor's office? Yes When did it last happen?10+ years If all above answers are NO, may proceed with cephalosporin use.   Anesthetics, Amide Nausea And Vomiting   Does not know name of it. States they put it on record foot center.    Betadine [povidone Iodine] Other (See Comments)   "Skin Burn" caused  scar on Left buttock   Peach [prunus Persica] Hives   Claritin [loratadine]    Rash, pruritis   Latex Hives, Itching, Rash      Medication List    STOP taking these medications   traMADol 50 MG tablet Commonly known as: ULTRAM     TAKE these medications   ALPRAZolam 0.25 MG tablet Commonly known as: XANAX TAKE 1 TABLET BY MOUTH ONCE DAILY AS NEEDED FOR ANXIETY OR SLEEP What changed:   how much to take  how to take this  when to take this  reasons to take this  additional instructions   atorvastatin 20 MG tablet Commonly known as: LIPITOR Take 1 tablet (20 mg total) by mouth daily.   carvedilol 12.5 MG tablet Commonly known as: COREG Take 1 tablet (12.5 mg total) by mouth 2 (two) times daily with a meal. What changed:   medication strength  See the new instructions.   cholecalciferol 25 MCG (1000 UNIT) tablet Commonly known as: VITAMIN D3 Take 1,000 Units by mouth daily.   diphenhydrAMINE 25 MG tablet Commonly known as: BENADRYL Take 25 mg by mouth daily as needed for allergies.   DULoxetine 30  MG capsule Commonly known as: Cymbalta Take 1 capsule (30 mg total) by mouth daily.   gabapentin 400 MG capsule Commonly known as: NEURONTIN Take 400 mg by mouth 3 (three) times daily.   meclizine 25 MG tablet Commonly known as: ANTIVERT Take 1 tablet (25 mg total) by mouth 2 (two) times daily as needed for dizziness.   methocarbamol 500 MG tablet Commonly known as: ROBAXIN Take 500 mg by mouth every 8 (eight) hours as needed for muscle spasms.   omeprazole 40 MG capsule Commonly known as: PRILOSEC Take 1 capsule (40 mg total) by mouth daily.   polyethylene glycol 17 [name] packet Commonly known as: MIRALAX / GLYCOLAX Take 17 [name] by mouth daily.      Allergies  Allergen Reactions   Cephalosporins Anaphylaxis   Penicillins Anaphylaxis and Hives    Did it involve swelling of the face/tongue/throat, SOB, or low BP? Yes Did it involve sudden or severe  rash/hives, skin peeling, or any reaction on the inside of your mouth or nose? Yes Did you need to seek medical attention at a hospital or doctor's office? Yes When did it last happen?10+ years If all above answers are NO, may proceed with cephalosporin use.     Anesthetics, Amide Nausea And Vomiting    Does not know name of it. States they put it on record foot center.    Betadine [Povidone Iodine] Other (See Comments)    "Skin Burn" caused scar on Left buttock   Peach [Prunus Persica] Hives   Claritin [Loratadine]     Rash, pruritis    Latex Hives, Itching and Rash    Follow-up Information    Cheryl, [name], MD. Call in 1 day(s).   Specialty: Family Medicine Why: Please call for a post hospital follow-up appointment. Contact information: [place] [address] [phone]        Pixie Casino, MD .   Specialty: Cardiology Contact information: [place] [address] [phone]                The results of significant diagnostics from this hospitalization (including imaging, microbiology, ancillary and laboratory) are listed below for reference.    Significant Diagnostic Studies: EEG  Result Date: 12/15/2020 Lora Havens, MD     12/15/2020  3:55 PM Patient Name: Courtland Coppa MRN: [id number] Epilepsy Attending: Lora Havens Referring Physician/Provider: Clance Boll, NP Date: 12/15/2020 Duration: 24.29 mins Patient history: 60yo F with syncope, vertigo. EEG to evaluate for seizure Level of alertness: Awake, asleep AEDs during EEG study: GBP Technical aspects: This EEG study was done with scalp electrodes positioned according to the 10-20 International system of electrode placement. Electrical activity was acquired at a sampling rate of 500Hz  and reviewed with a high frequency filter of 70Hz  and a low frequency filter of 1Hz . EEG data were recorded continuously and digitally  stored. Description: The posterior dominant rhythm consists of 9-10 Hz activity of moderate voltage (25-35 uV) seen predominantly in posterior head regions, symmetric and reactive to eye opening and eye closing. Sleep was characterized by vertex waves, sleep spindles (12 to 14 Hz), maximal frontocentral region. Hyperventilation did not show any EEG change. Hyperventilation and photic stimulation were not performed.   IMPRESSION: This study is within normal limits. No seizures or epileptiform discharges were seen throughout the recording. Priyanka Barbra Sarks   CT ANGIO HEAD W OR WO CONTRAST  Result Date: 12/14/2020 CLINICAL DATA:  Recurrent syncopal  episode. EXAM: CT ANGIOGRAPHY HEAD AND NECK TECHNIQUE: Multidetector CT imaging of the head and neck was performed using the standard protocol during bolus administration of intravenous contrast. Multiplanar CT image reconstructions and MIPs were obtained to evaluate the vascular anatomy. Carotid stenosis measurements (when applicable) are obtained utilizing NASCET criteria, using the distal internal carotid diameter as the denominator. CONTRAST:  70mL OMNIPAQUE IOHEXOL 350 MG/ML SOLN COMPARISON:  None. FINDINGS: CT HEAD FINDINGS Brain: The brain shows a normal appearance without evidence of malformation, atrophy, old or acute small or large vessel infarction, mass lesion, hemorrhage, hydrocephalus or extra-axial collection. Vascular: No hyperdense vessel. No evidence of atherosclerotic calcification. Skull: Normal.  No traumatic finding.  No focal bone lesion. Sinuses/Orbits: Sinuses are clear. Orbits appear normal. Mastoids are clear. Other: None significant CTA NECK FINDINGS Aortic arch: Minimal aortic atherosclerosis. Branching pattern is normal without origin stenosis. Right carotid system: Common carotid artery widely patent to bifurcation. Carotid bifurcation shows minimal atherosclerotic plaque but no stenosis. Cervical ICA widely patent. Left carotid system:  Common carotid artery widely patent to the bifurcation. Carotid bifurcation shows mild atherosclerotic plaque but no stenosis. Cervical ICA widely patent. Vertebral arteries: Both vertebral arteries are widely patent at their origin and through the cervical region to the foramen magnum. Skeleton: Ordinary cervical spondylosis. Other neck: No mass or lymphadenopathy. Upper chest: Normal Review of the MIP images confirms the above findings CTA HEAD FINDINGS Anterior circulation: Both internal carotid arteries are widely patent through the skull base and siphon regions. The anterior and middle cerebral vessels are patent without stenosis or occlusion, aneurysm or vascular malformation. Posterior circulation: Both vertebral arteries widely patent to the basilar. No basilar stenosis. Posterior circulation branch vessels are normal. Venous sinuses: Patent and normal. Anatomic variants: None significant. Review of the MIP images confirms the above findings IMPRESSION: Minimal aortic atherosclerosis. Minimal atherosclerotic change at both carotid bifurcations but no stenosis. No intracranial large or medium vessel occlusion or correctable proximal stenosis. Aortic Atherosclerosis (ICD10-I70.0). Electronically Signed   By: Nelson Chimes M.D.   On: 12/14/2020 22:32   DG Chest 2 View  Result Date: 12/14/2020 CLINICAL DATA:  Mid chest pain for 5 days radiating to LEFT axilla, no history of trauma EXAM: CHEST - 2 VIEW COMPARISON:  03/16/2020 FINDINGS: Intraspinal stimulator leads again identified. Normal heart size, mediastinal contours, and pulmonary vascularity. Lungs clear. No pulmonary infiltrate, pleural effusion, or pneumothorax. Osseous structures unremarkable. IMPRESSION: No acute abnormalities. Electronically Signed   By: Lavonia Dana M.D.   On: 12/14/2020 18:51   CT ANGIO NECK W OR WO CONTRAST  Result Date: 12/14/2020 CLINICAL DATA:  Recurrent syncopal episode. EXAM: CT ANGIOGRAPHY HEAD AND NECK TECHNIQUE:  Multidetector CT imaging of the head and neck was performed using the standard protocol during bolus administration of intravenous contrast. Multiplanar CT image reconstructions and MIPs were obtained to evaluate the vascular anatomy. Carotid stenosis measurements (when applicable) are obtained utilizing NASCET criteria, using the distal internal carotid diameter as the denominator. CONTRAST:  18mL OMNIPAQUE IOHEXOL 350 MG/ML SOLN COMPARISON:  None. FINDINGS: CT HEAD FINDINGS Brain: The brain shows a normal appearance without evidence of malformation, atrophy, old or acute small or large vessel infarction, mass lesion, hemorrhage, hydrocephalus or extra-axial collection. Vascular: No hyperdense vessel. No evidence of atherosclerotic calcification. Skull: Normal.  No traumatic finding.  No focal bone lesion. Sinuses/Orbits: Sinuses are clear. Orbits appear normal. Mastoids are clear. Other: None significant CTA NECK FINDINGS Aortic arch: Minimal aortic atherosclerosis. Branching pattern is normal without  origin stenosis. Right carotid system: Common carotid artery widely patent to bifurcation. Carotid bifurcation shows minimal atherosclerotic plaque but no stenosis. Cervical ICA widely patent. Left carotid system: Common carotid artery widely patent to the bifurcation. Carotid bifurcation shows mild atherosclerotic plaque but no stenosis. Cervical ICA widely patent. Vertebral arteries: Both vertebral arteries are widely patent at their origin and through the cervical region to the foramen magnum. Skeleton: Ordinary cervical spondylosis. Other neck: No mass or lymphadenopathy. Upper chest: Normal Review of the MIP images confirms the above findings CTA HEAD FINDINGS Anterior circulation: Both internal carotid arteries are widely patent through the skull base and siphon regions. The anterior and middle cerebral vessels are patent without stenosis or occlusion, aneurysm or vascular malformation. Posterior circulation:  Both vertebral arteries widely patent to the basilar. No basilar stenosis. Posterior circulation branch vessels are normal. Venous sinuses: Patent and normal. Anatomic variants: None significant. Review of the MIP images confirms the above findings IMPRESSION: Minimal aortic atherosclerosis. Minimal atherosclerotic change at both carotid bifurcations but no stenosis. No intracranial large or medium vessel occlusion or correctable proximal stenosis. Aortic Atherosclerosis (ICD10-I70.0). Electronically Signed   By: Nelson Chimes M.D.   On: 12/14/2020 22:32   ECHOCARDIOGRAM COMPLETE  Result Date: 12/15/2020    ECHOCARDIOGRAM REPORT   Patient Name:   [name] Date of Exam: 12/15/2020 Medical Rec #:  [id number]                Height:       65.0 in Accession #:    [id number]               Weight:       176.6 lb Date of Birth:  1961/07/26               BSA:          1.876 m Patient Age:    66 years                 BP:           116/83 mmHg Patient Gender: F                        HR:           71 bpm. Exam Location:  Inpatient Procedure: 2D Echo, Cardiac Doppler and Color Doppler Indications:    Syncope  History:        Patient has prior history of Echocardiogram examinations, most                 recent 11/27/2002. Risk Factors:Hypertension.  Sonographer:    Clayton Lefort RDCS (AE) Referring Phys: [place]  1. Left ventricular ejection fraction, by estimation, is 65 to 70%. The left ventricle has normal function. The left ventricle has no regional wall motion abnormalities. There is severe asymmetric left ventricular hypertrophy of the basal-septal segment. The IVSd is 1.9 cm - no LVOT obstruction is noted at rest. Valsalva not performed. Findings suggest possible hypertrophic cardiomyopathy. Left ventricular diastolic parameters are consistent with Grade I diastolic dysfunction (impaired relaxation).  2. Right ventricular systolic function is normal. The right ventricular size is  normal. There is normal pulmonary artery systolic pressure.  3. The aortic valve is tricuspid. Aortic valve regurgitation is not visualized.  4. The inferior vena cava is normal in size with greater than 50% respiratory variability, suggesting right atrial pressure of 3 mmHg.  5. The mitral  valve is grossly normal. Trivial mitral valve regurgitation. Conclusion(s)/Recommendation(s): Findings consistent with possible hypertrophic cardiomyopathy. Consider cMRI to evalute further. FINDINGS  Left Ventricle: Left ventricular ejection fraction, by estimation, is 65 to 70%. The left ventricle has normal function. The left ventricle has no regional wall motion abnormalities. The left ventricular internal cavity size was normal in size. There is  severe asymmetric left ventricular hypertrophy of the basal-septal segment. Left ventricular diastolic parameters are consistent with Grade I diastolic dysfunction (impaired relaxation). Indeterminate filling pressures. Right Ventricle: The right ventricular size is normal. No increase in right ventricular wall thickness. Right ventricular systolic function is normal. There is normal pulmonary artery systolic pressure. The tricuspid regurgitant velocity is 2.57 m/s, and  with an assumed right atrial pressure of 3 mmHg, the estimated right ventricular systolic pressure is 19.4 mmHg. Left Atrium: Left atrial size was normal in size. Right Atrium: Right atrial size was normal in size. Pericardium: There is no evidence of pericardial effusion. Mitral Valve: The mitral valve is grossly normal. Trivial mitral valve regurgitation. MV peak gradient, 4.1 mmHg. The mean mitral valve gradient is 2.0 mmHg. No SAM is noted. Tricuspid Valve: The tricuspid valve is grossly normal. Tricuspid valve regurgitation is mild. Aortic Valve: The aortic valve is tricuspid. Aortic valve regurgitation is not visualized. Aortic valve mean gradient measures 3.0 mmHg. Aortic valve peak gradient measures 5.9  mmHg. Aortic valve area, by VTI measures 2.03 cm. Pulmonic Valve: The pulmonic valve was normal in structure. Pulmonic valve regurgitation is not visualized. Aorta: The aortic root and ascending aorta are structurally normal, with no evidence of dilitation. Venous: The inferior vena cava is normal in size with greater than 50% respiratory variability, suggesting right atrial pressure of 3 mmHg. IAS/Shunts: No atrial level shunt detected by color flow Doppler.  LEFT VENTRICLE PLAX 2D LVIDd:         3.20 cm  Diastology LVIDs:         2.30 cm  LV e' medial:    0.08 cm/s LV PW:         1.40 cm  LV E/e' medial:  10.9 LV IVS:        1.90 cm  LV e' lateral:   0.09 cm/s LVOT diam:     1.90 cm  LV E/e' lateral: 9.2 LV SV:         51 LV SV Index:   27 LVOT Area:     2.84 cm  RIGHT VENTRICLE             IVC RV Basal diam:  3.40 cm     IVC diam: 1.90 cm RV S prime:     11.30 cm/s TAPSE (M-mode): 2.3 cm LEFT ATRIUM             Index       RIGHT ATRIUM           Index LA diam:        3.10 cm 1.65 cm/m  RA Area:     16.90 cm LA Vol (A2C):   38.7 ml 20.63 ml/m RA Volume:   43.80 ml  23.35 ml/m LA Vol (A4C):   27.7 ml 14.76 ml/m LA Biplane Vol: 34.2 ml 18.23 ml/m  AORTIC VALVE AV Area (Vmax):    2.06 cm AV Area (Vmean):   2.01 cm AV Area (VTI):     2.03 cm AV Vmax:           121.00 cm/s AV Vmean:  78.600 cm/s AV VTI:            0.251 m AV Peak Grad:      5.9 mmHg AV Mean Grad:      3.0 mmHg LVOT Vmax:         88.10 cm/s LVOT Vmean:        55.600 cm/s LVOT VTI:          0.180 m LVOT/AV VTI ratio: 0.72  AORTA Ao Root diam: 2.90 cm Ao Asc diam:  3.00 cm MITRAL VALVE               TRICUSPID VALVE MV Area (PHT): 3.16 cm    TR Peak grad:   26.4 mmHg MV Area VTI:   1.82 cm    TR Vmax:        257.00 cm/s MV Peak grad:  4.1 mmHg MV Mean grad:  2.0 mmHg    SHUNTS MV Vmax:       1.01 m/s    Systemic VTI:  0.18 m MV Vmean:      59.4 cm/s   Systemic Diam: 1.90 cm MV Decel Time: 240 msec MV E velocity: 0.83 cm/s MV A  velocity: 83.60 cm/s MV E/A ratio:  0.01 Lyman Bishop MD Electronically signed by Lyman Bishop MD Signature Date/Time: 12/15/2020/12:52:05 PM    Final     Microbiology: Recent Results (from the past 240 hour(s))  SARS CORONAVIRUS 2 (TAT 6-24 HRS) Nasopharyngeal Nasopharyngeal Swab     Status: None   Collection Time: 12/14/20  2:16 PM   Specimen: Nasopharyngeal Swab  Result Value Ref Range Status   SARS Coronavirus 2 NEGATIVE NEGATIVE Final    Comment: (NOTE) SARS-CoV-2 target nucleic acids are NOT DETECTED.  The SARS-CoV-2 RNA is generally detectable in upper and lower respiratory specimens during the acute phase of infection. Negative results do not preclude SARS-CoV-2 infection, do not rule out co-infections with other pathogens, and should not be used as the sole basis for treatment or other patient management decisions. Negative results must be combined with clinical observations, patient history, and epidemiological information. The expected result is Negative.  Fact Sheet for Patients: SugarRoll.be  Fact Sheet for Healthcare Providers: https://www.woods-mathews.com/  This test is not yet approved or cleared by the [place] FDA and  has been authorized for detection and/or diagnosis of SARS-CoV-2 by FDA under an Emergency Use Authorization (EUA). This EUA will remain  in effect (meaning this test can be used) for the duration of the COVID-19 declaration under Se ction 564(b)(1) of the Act, 21 U.S.C. section 360bbb-3(b)(1), unless the authorization is terminated or revoked sooner.  Performed at [facility], [place] [address] [place], [place] [address]   MRSA PCR Screening     Status: None   Collection Time: 12/14/20 11:37 PM   Specimen: Nasopharyngeal  Result Value Ref Range Status   MRSA by PCR NEGATIVE NEGATIVE Final    Comment:        The GeneXpert MRSA Assay (FDA approved for NASAL specimens only), is one  component of a comprehensive MRSA colonization surveillance program. It is not intended to diagnose MRSA infection nor to guide or monitor treatment for MRSA infections. Performed at [facility], [place] [address] [place], [place] [address]      Labs: Basic Metabolic Panel: Recent Labs  Lab 12/14/20 1230 12/15/20 0051 12/16/20 0124 12/17/20 0040 12/19/20 0221  NA 139 138 136 136 137  K 3.9 3.7 3.8 3.4* 4.4  CL 103 103 101 101 101  CO2 28 23 28 26 24   GLUCOSE 96 94 92 115* 95  BUN 9 10 7 7 12   CREATININE 0.99 0.91 0.94 0.97 1.01*  CALCIUM 9.0 8.5* 8.7* 8.6* 8.7*   Liver Function Tests: No results for input(s): AST, ALT, ALKPHOS, BILITOT, PROT, ALBUMIN in the last 168 hours. No results for input(s): LIPASE, AMYLASE in the last 168 hours. No results for input(s): AMMONIA in the last 168 hours. CBC: Recent Labs  Lab 12/14/20 1230 12/15/20 0051 12/16/20 0124 12/17/20 0040 12/19/20 0221  WBC 4.3 5.7 4.8 5.7 5.9  HGB 12.6 11.7* 11.8* 12.2 12.4  HCT 39.2 35.4* 35.8* 36.0 37.8  MCV 91.6 90.1 89.9 88.2 91.1  PLT 284 255 261 272 268   Cardiac Enzymes: No results for input(s): CKTOTAL, CKMB, CKMBINDEX, TROPONINI in the last 168 hours. BNP: BNP (last 3 results) No results for input(s): BNP in the last 8760 hours.  ProBNP (last 3 results) No results for input(s): PROBNP in the last 8760 hours.  CBG: Recent Labs  Lab 12/14/20 1352  GLUCAP 80       Signed:  Kayleen Memos, MD [facility] 12/20/2020, 11:01 AM

## 2020-12-20 NOTE — TOC Progression Note (Addendum)
Transition of Care [facility]) - Progression Note    Patient Details  Name: [name] MRN: [id number] Date of Birth: [birth year]  Transition of [facility]) CM/SW Contact  Zenon Mayo, RN Phone Number: 12/20/2020, 11:58 AM  Clinical Narrative:    NCM spoke with patient and spouse at bedside, NCM offered choice for HHPT (vestibular) and HHOT.  She states she had [facility] in the past would like to try them but if they can't she does not have a preference of the agency. NCM made referral to [facility] with [facility] awaiting call back.  Patient has w/chair, bsc, shower chair, afo brace, walker and a cane at home. She states her spouse works, but she has a girlfriend who lives right around the corner who can help her out, she is retired.  She has no transportation issues.  Awaiting call back from [place]. [facility] unable to take referral.        Expected Discharge Plan and Services           Expected Discharge Date: 12/20/20                                     Social Determinants of Health (SDOH) Interventions    Readmission Risk Interventions No flowsheet data found.

## 2020-12-21 ENCOUNTER — Other Ambulatory Visit (HOSPITAL_COMMUNITY): Payer: Self-pay | Admitting: Physical Medicine and Rehabilitation

## 2020-12-21 ENCOUNTER — Telehealth: Payer: Self-pay | Admitting: Family Medicine

## 2020-12-21 DIAGNOSIS — Z79891 Long term (current) use of opiate analgesic: Secondary | ICD-10-CM | POA: Diagnosis not present

## 2020-12-21 DIAGNOSIS — Z79899 Other long term (current) drug therapy: Secondary | ICD-10-CM | POA: Diagnosis not present

## 2020-12-21 DIAGNOSIS — Z5181 Encounter for therapeutic drug level monitoring: Secondary | ICD-10-CM | POA: Diagnosis not present

## 2020-12-21 NOTE — Telephone Encounter (Signed)
Transition Care Management Follow-up Telephone Call  Date of discharge and from where: 12/20/2020 from [facility]   How have you been since you were released from the hospital? States she is doing ok. Still having dizziness pain and some chest pain   Any questions or concerns? No  Items Reviewed:  Did the pt receive and understand the discharge instructions provided? Yes   Medications obtained and verified? No   Other? No   Any new allergies since your discharge? No   Dietary orders reviewed? Yes  Do you have support at home? Yes   Home Care and Equipment/Supplies: Were home health services ordered? yes If so, what is the name of the agency? [facility]   Has the agency set up a time to come to the patient's home? no Were any new equipment or medical supplies ordered?  No What is the name of the medical supply agency? N/A  Were you able to get the supplies/equipment? not applicable Do you have any questions related to the use of the equipment or supplies? No  Functional Questionnaire: (I = Independent and D = Dependent) ADLs: I  Bathing/Dressing- I  Meal Prep- I  Eating- I  Maintaining continence- I  Transferring/Ambulation- I  Managing Meds- I  Follow up appointments reviewed:   PCP Hospital f/u appt confirmed? Yes  Scheduled to see Dr. [name]  on 12/28/2020  @ 8:30 am .  [facility] f/u appt confirmed? Yes  Scheduled to see Dr.Kilroy on 01/01/21 @ 10:15 am.  Are transportation arrangements needed? No   If their condition worsens, is the pt aware to call PCP or go to the Emergency Dept.? Yes  Was the patient provided with contact information for the PCP's office or ED? Yes  Was to pt encouraged to call back with questions or concerns? Yes

## 2020-12-22 ENCOUNTER — Encounter (INDEPENDENT_AMBULATORY_CARE_PROVIDER_SITE_OTHER): Payer: PPO

## 2020-12-22 DIAGNOSIS — R55 Syncope and collapse: Secondary | ICD-10-CM | POA: Diagnosis not present

## 2020-12-22 DIAGNOSIS — R42 Dizziness and giddiness: Secondary | ICD-10-CM

## 2020-12-26 ENCOUNTER — Encounter: Payer: PPO | Attending: Physical Medicine & Rehabilitation | Admitting: Physical Medicine & Rehabilitation

## 2020-12-26 ENCOUNTER — Other Ambulatory Visit: Payer: Self-pay

## 2020-12-26 ENCOUNTER — Encounter: Payer: Self-pay | Admitting: Physical Medicine & Rehabilitation

## 2020-12-26 DIAGNOSIS — H8112 Benign paroxysmal vertigo, left ear: Secondary | ICD-10-CM | POA: Insufficient documentation

## 2020-12-26 NOTE — Progress Notes (Signed)
Subjective:    Patient ID: [name], female    DOB: 08/25/1961, 60 y.o.   MRN: [id number]  HPI   [name] is here in folllow up of her chronic pain and gait disorder. She was admitted to the hospital with chest pain and difficulty swallowing. Cardiac work up was negative except for cardiac hypertrophy. She has outpt f/u with cardiology. Swallowing has improved but she is still having some dizziness. The describes the dizziness as the "room is spinning". She tested positive for left BPPV posterior canal.  She was set up with [facility] therapy. They have not been out to the house yet. She is doing HEP vestibular exercises.     Pain Inventory Average Pain 6 Pain Right Now 6 My pain is constant and tingling  In the last 24 hours, has pain interfered with the following? General activity 6 Relation with others 5 Enjoyment of life 7 What TIME of day is your pain at its worst? morning  and night Sleep (in general) Fair  Pain is worse with: bending, sitting, standing and some activites Pain improves with: heat/ice, therapy/exercise and medication Relief from Meds: 7  Family History  Problem Relation Age of Onset   Heart attack Mother    Lung cancer Father    Cancer Father    Pancreatic cancer Sister    Breast cancer Sister 36   Throat cancer Brother    Multiple myeloma Sister    Breast cancer Sister        diagnosed in her 2's   Heart attack Sister    Stomach cancer Cousin    Colon cancer Neg Hx    Social History   Socioeconomic History   Marital status: Married    Spouse name: Not on file   Number of children: Not on file   Years of education: Not on file   Highest education level: Not on file  Occupational History   Not on file  Tobacco Use   Smoking status: Never Smoker   Smokeless tobacco: Never Used  Vaping Use   Vaping Use: Never used  Substance and Sexual Activity   Alcohol use: No   Drug use: No   Sexual activity: Yes     Partners: Male    Birth control/protection: Post-menopausal  Other Topics Concern   Not on file  Social History Narrative   Not on file   Social Determinants of Health   Financial Resource Strain: Not on file  Food Insecurity: Not on file  Transportation Needs: Not on file  Physical Activity: Not on file  Stress: Not on file  Social Connections: Not on file   Past Surgical History:  Procedure Laterality Date   ABDOMINAL HYSTERECTOMY     BIOPSY  12/16/2020   Procedure: BIOPSY;  Surgeon: Ladene Artist, MD;  Location: [place];  Service: Endoscopy;;   CARDIAC CATHETERIZATION N/A 04/18/2015   Procedure: Left Heart Cath and Coronary Angiography;  Surgeon: [name], MD;  Location: [place] CV LAB;  Service: Cardiovascular;  Laterality: N/A;   COLONOSCOPY W/ BIOPSIES AND POLYPECTOMY     ESOPHAGOGASTRODUODENOSCOPY (EGD) WITH PROPOFOL N/A 12/16/2020   Procedure: ESOPHAGOGASTRODUODENOSCOPY (EGD) WITH PROPOFOL;  Surgeon: Ladene Artist, MD;  Location: [facility] ENDOSCOPY;  Service: Endoscopy;  Laterality: N/A;   FOOT SURGERY Bilateral    [place] "bunion,bone spur, tendon" (1) -6'16, (1)-10'16   IR EPIDUROGRAPHY  07/21/2018   LUMBAR LAMINECTOMY/DECOMPRESSION MICRODISCECTOMY Bilateral 12/28/2015   Procedure: MICRO LUMBAR DECOMPRESSION L4 -  L5 BILATERALLY;  Surgeon: [name], MD;  Location: [facility];  Service: Orthopedics;  Laterality: Bilateral;   LUMBAR LAMINECTOMY/DECOMPRESSION MICRODISCECTOMY Bilateral 03/04/2018   Procedure: Revision of Microlumbar Decompression Bilateral Lumbar Four-Five;  Surgeon: [name], MD;  Location: [place];  Service: Orthopedics;  Laterality: Bilateral;  90 mins   SAVORY DILATION N/A 12/16/2020   Procedure: SAVORY DILATION;  Surgeon: Ladene Artist, MD;  Location: [facility]COPY;  Service: Endoscopy;  Laterality: N/A;   SPINAL CORD STIMULATOR INSERTION N/A 09/28/2019   Procedure: THORACIC SPINAL CORD STIMULATOR INSERTION;   Surgeon: Melina Schools, MD;  Location: [place];  Service: Orthopedics;  Laterality: N/A;  2.5 hrs   TUBAL LIGATION     WISDOM TOOTH EXTRACTION     WOUND EXPLORATION N/A 03/04/2018   Procedure: EXPLORATION OF LUMBAR DECOMPRESSION WOUND;  Surgeon: [name], MD;  Location: [place];  Service: Orthopedics;  Laterality: N/A;   Past Surgical History:  Procedure Laterality Date   ABDOMINAL HYSTERECTOMY     BIOPSY  12/16/2020   Procedure: BIOPSY;  Surgeon: Ladene Artist, MD;  Location: [place];  Service: Endoscopy;;   CARDIAC CATHETERIZATION N/A 04/18/2015   Procedure: Left Heart Cath and Coronary Angiography;  Surgeon: [name], MD;  Location: [place] CV [facility];  Service: Cardiovascular;  Laterality: N/A;   COLONOSCOPY W/ BIOPSIES AND POLYPECTOMY     ESOPHAGOGASTRODUODENOSCOPY (EGD) WITH PROPOFOL N/A 12/16/2020   Procedure: ESOPHAGOGASTRODUODENOSCOPY (EGD) WITH PROPOFOL;  Surgeon: Ladene Artist, MD;  Location[facility]COPY;  Service: Endoscopy;  Laterality: N/A;   FOOT SURGERY Bilateral    [place] "bunion,bone spur, tendon" (1) -6'16, (1)-10'16   IR EPIDUROGRAPHY  07/21/2018   LUMBAR LAMINECTOMY/DECOMPRESSION MICRODISCECTOMY Bilateral 12/28/2015   Procedure: MICRO LUMBAR DECOMPRESSION L4 - L5 BILATERALLY;  Surgeon: [name], MD;  Location: [facility];  Service: Orthopedics;  Laterality: Bilateral;   LUMBAR LAMINECTOMY/DECOMPRESSION MICRODISCECTOMY Bilateral 03/04/2018   Procedure: Revision of Microlumbar Decompression Bilateral Lumbar Four-Five;  Surgeon: [name], MD;  Location: [place];  Service: Orthopedics;  Laterality: Bilateral;  90 mins   SAVORY DILATION N/A 12/16/2020   Procedure: SAVORY DILATION;  Surgeon: Ladene Artist, MD;  Location: [facility] ENDOSCOPY;  Service: Endoscopy;  Laterality: N/A;   SPINAL CORD STIMULATOR INSERTION N/A 09/28/2019   Procedure: THORACIC SPINAL CORD STIMULATOR INSERTION;  Surgeon: Melina Schools, MD;  Location: [place];  Service:  Orthopedics;  Laterality: N/A;  2.5 hrs   TUBAL LIGATION     WISDOM TOOTH EXTRACTION     WOUND EXPLORATION N/A 03/04/2018   Procedure: EXPLORATION OF LUMBAR DECOMPRESSION WOUND;  Surgeon: [name], MD;  Location: [place];  Service: Orthopedics;  Laterality: N/A;   Past Medical History:  Diagnosis Date   Anxiety    Back pain    related to spinal stenosis and disc problem, radiates down left buttocks to leg., weakness occ.   Dyspnea    Headache    Hyperlipidemia    Hypertension    Lumbar post-laminectomy syndrome    PONV (postoperative nausea and vomiting)    Vaginal foreign object    "Uses Femring"   Pulse 79    Temp 97.6 F (36.4 C)    Ht $R'5\' 5"'IX$  (1.651 m)    Wt 168 lb (76.2 kg)    SpO2 98%    BMI 27.96 kg/m   Opioid Risk Score:   Fall Risk Score:  `1  Depression screen [facility] 2/9  Depression screen [facility] 2/9 04/22/2020 08/17/2018 07/05/2018 05/03/2018 04/01/2018  Decreased Interest 2 0 0 0 1  Down, Depressed, Hopeless 0 0 0 0 1  PHQ - 2 Score 2 0 0 0 2  Altered sleeping 2 - - - 1  Tired, decreased energy 2 - - - 1  Change in appetite 1 - - - 1  Feeling bad or failure about yourself  0 - - - 0  Trouble concentrating 2 - - - 0  Moving slowly or fidgety/restless 1 - - - 0  Suicidal thoughts 0 - - - 0  PHQ-9 Score 10 - - - 5  Difficult doing work/chores Very difficult - - - Somewhat difficult  Some recent data might be hidden   Review of Systems  Cardiovascular: Positive for chest pain.  Musculoskeletal: Positive for back pain and gait problem.  Neurological: Positive for dizziness and weakness.  All other systems reviewed and are negative.      Objective:   Physical Exam  General: No acute distress HEENT: EOMI, oral membranes moist Cards: reg rate  Chest: normal effort Abdomen: Soft, NT, ND Skin: dry, intact Extremities: no edema Psych: pleasant and appropriate Skin: intact Neuro: RLE 2/5 to 3/5 with cogwheeling ongoing.[name] LLE 4/5. Sensory loss distally  bilaterally. Nystagmus with left lateral gaze Musculoskeletal: LB TTP.  Stimulator in place.            Assessment & Plan:  1. Post Lumbar Laminectomy / Decompression: Lumbar Radiculopathy:             -gabapentin $RemoveBefo'400mg'XKAngSOQTgh$  TID             -tramadol             -cymbalta $RemoveBe'30mg'cZXTjhpKi$  daily              2. New onset left BPPV-referral to neuro-rehab for gait/vestibular rx----eventually aquatic therapy when monitoring is complete  -needs to continue her home vestibular exercises 3. Anxiety. PCP Following and prescribing Alprazolam. Education Provided. Continue to Monitor.              -resume cymbalta $RemoveBeforeDE'30mg'WLACdFQPJLLXlQp$  daily 4. Spasms: may continue robaxin   15 minutes of face to face patient care time were spent during this visit. All questions were encouraged and answered.  Follow up with me in 3 mos .

## 2020-12-26 NOTE — Patient Instructions (Signed)
PLEASE FEEL FREE TO CALL OUR OFFICE WITH ANY PROBLEMS OR QUESTIONS ([phone])      

## 2020-12-27 ENCOUNTER — Other Ambulatory Visit (HOSPITAL_COMMUNITY): Payer: Self-pay | Admitting: Chiropractic Medicine

## 2020-12-27 MED FILL — METHOCARBAMOL 500 MG TABS: 500 | 90 days supply | Qty: 90 | Fill #0

## 2020-12-27 MED FILL — GABAPENTIN 400 MG CAPSULE: 400 | 30 days supply | Qty: 90 | Fill #3

## 2020-12-28 ENCOUNTER — Encounter: Payer: Self-pay | Admitting: Family Medicine

## 2020-12-28 ENCOUNTER — Other Ambulatory Visit: Payer: Self-pay

## 2020-12-28 ENCOUNTER — Ambulatory Visit (INDEPENDENT_AMBULATORY_CARE_PROVIDER_SITE_OTHER): Payer: PPO | Admitting: Family Medicine

## 2020-12-28 VITALS — BP 128/80 | HR 88 | Resp 12 | Ht 65.0 in

## 2020-12-28 DIAGNOSIS — I10 Essential (primary) hypertension: Secondary | ICD-10-CM

## 2020-12-28 DIAGNOSIS — G5602 Carpal tunnel syndrome, left upper limb: Secondary | ICD-10-CM | POA: Diagnosis not present

## 2020-12-28 DIAGNOSIS — I517 Cardiomegaly: Secondary | ICD-10-CM | POA: Diagnosis not present

## 2020-12-28 DIAGNOSIS — R1310 Dysphagia, unspecified: Secondary | ICD-10-CM | POA: Diagnosis not present

## 2020-12-28 DIAGNOSIS — H8112 Benign paroxysmal vertigo, left ear: Secondary | ICD-10-CM | POA: Diagnosis not present

## 2020-12-28 NOTE — Patient Instructions (Addendum)
A few things to remember from today's visit:   Essential hypertension  BPPV (benign paroxysmal positional vertigo), left  Dysphagia, unspecified type  Carpal tunnel syndrome of left wrist  LVH (left ventricular hypertrophy)  If you need refills please call your pharmacy. Do not use My Chart to request refills or for acute issues that need immediate attention.   Carpal Tunnel Syndrome  Carpal tunnel syndrome is a condition that causes pain, weakness, and numbness in your hand and arm. Numbness is when you cannot feel an area in your body. The carpal tunnel is a narrow area that is on the palm side of your wrist. Repeated wrist motion or certain diseases may cause swelling in the tunnel. This swelling can pinch the main nerve in the wrist. This nerve is called the median nerve. What are the causes? This condition may be caused by:  Moving your hand and wrist over and over again while doing a task.  Injury to the wrist.  Arthritis.  A sac of fluid (cyst) or abnormal growth (tumor) in the carpal tunnel.  Fluid buildup during pregnancy.  Use of tools that vibrate. Sometimes the cause is not known. What increases the risk? The following factors may make you more likely to have this condition:  Having a job that makes you do these things: ? Move your hand over and over again. ? Work with tools that vibrate, such as drills or sanders.  Being a woman.  Having diabetes, obesity, thyroid problems, or kidney failure. What are the signs or symptoms? Symptoms of this condition include:  A tingling feeling in your fingers.  Tingling or loss of feeling in your hand.  Pain in your entire arm. This pain may get worse when you bend your wrist and elbow for a long time.  Pain in your wrist that goes up your arm to your shoulder.  Pain that goes down into your palm or fingers.  Weakness in your hands. You may find it hard to grab and hold items. You may feel worse at night. How  is this treated? This condition may be treated with:  Lifestyle changes. You will be asked to stop or change the activity that caused your problem.  Doing exercises and activities that make bones, muscles, and tendons stronger (physical therapy).  Learning how to use your hand again (occupational therapy).  Medicines for pain and swelling. You may have injections in your wrist.  A wrist splint or brace.  Surgery. Follow these instructions at home: If you have a splint or brace:  Wear the splint or brace as told by your doctor. Take it off only as told by your doctor.  Loosen the splint if your fingers: ? Tingle. ? Become numb. ? Turn cold and blue.  Keep the splint or brace clean.  If the splint or brace is not waterproof: ? Do not let it get wet. ? Cover it with a watertight covering when you take a bath or a shower. Managing pain, stiffness, and swelling If told, put ice on the painful area:  If you have a removable splint or brace, remove it as told by your doctor.  Put ice in a plastic bag.  Place a towel between your skin and the bag.  Leave the ice on for 20 minutes, 2-3 times per day. Do not fall asleep with the cold pack on your skin.  Take off the ice if your skin turns bright red. This is very important. If you cannot feel  pain, heat, or cold, you have a greater risk of damage to the area. Move your fingers often to reduce stiffness and swelling.   General instructions  Take over-the-counter and prescription medicines only as told by your doctor.  Rest your wrist from any activity that may cause pain. If needed, talk with your boss at work about changes that can help your wrist heal.  Do exercises as told by your doctor, physical therapist, or occupational therapist.  Keep all follow-up visits. Contact a doctor if:  You have new symptoms.  Medicine does not help your pain.  Your symptoms get worse. Get help right away if:  You have very bad  numbness or tingling in your wrist or hand. Summary  Carpal tunnel syndrome is a condition that causes pain in your hand and arm.  It is often caused by repeated wrist motions.  Lifestyle changes and medicines are used to treat this problem. Surgery may help in very bad cases.  Follow your doctor's instructions about wearing a splint, resting your wrist, keeping follow-up visits, and calling for help. This information is not intended to replace advice given to you by your health care provider. Make sure you discuss any questions you have with your health care provider. Document Revised: 02/23/2020 Document Reviewed: 02/23/2020 Elsevier Patient Education  [place].   Please be sure medication list is accurate. If a new problem present, please set up appointment sooner than planned today.

## 2020-12-28 NOTE — Progress Notes (Addendum)
HPI: [name] is a 60 y.o. female, who is here today to follow on recent hospitalization.   She was hospitalized from 12/14/20 to 12/20/20. TCM call on 12/21/20. Evaluated here in the office on 12/14/20, when she was c/o chest pain and dysphagia. When she was having CXR done she had syncopal/presyncopal episode with generalized seizure like activity with no urine/bowel incontinence of post ictal like symptom. She was sent to the ER via ambulance.  She was discharged home. Dx:  . Postural dizziness with presyncope 12/14/2020  . Odynophagia   . Dysphagia 12/15/2020   Head and neck CTA: Minimal aortic atherosclerosis. Minimal atherosclerotic change at both carotid bifurcations but no stenosis. No intracranial large or medium vessel occlusion or correctable proximal stenosis.  Aortic Atherosclerosis (ICD10-I70.0). She is on Atorvastatin 20 mg daily.  Dizziness has improved "a lot." Spinning sensation. Slowly getting to her baseline. Still using her walker for transfer. She is not driving.  Recommended to continue vestibular therapy at home, once weekly but planning on increasing it to 2 times weekly.  She was instructed to follow with cardiologist.  HTN:  Occasional CP but not as bad as it was. Wearing a cardiac monitor. Carvedilol dose was increased to 12.5 mg bid. BP readings at home "good." Negative for SOB,palpitations,PND,orthopnea,edema,or diaphoresis.  Echo on 12/15/20 showed severe asymmetric left ventricular hypertrophy of the basal-septal. LVEF 28-41 % and diastolic dysfunction.  -2 days of left hand tingling, on and off. No associated weakness. It usually happens when working on computer and at night. She has not identified alleviating factors.  Dysphagia has resolved.  Started on Omeprazole 40 mg last visit and continued during hospitalization. She has not taken PPI in 2 days.  Anxiety: She is on Cymbalta 30 mg daily and Alprazolam 0.25  mg daily as needed.  Review of Systems  Constitutional: Positive for fatigue. Negative for activity change, appetite change and fever.  HENT: Negative for mouth sores and nosebleeds.   Eyes: Negative for redness and visual disturbance.  Respiratory: Negative for cough and wheezing.   Gastrointestinal: Negative for abdominal pain, nausea and vomiting.       Negative for changes in bowel habits.  Genitourinary: Negative for decreased urine volume, dysuria and hematuria.  Neurological: Negative for syncope and headaches.  Psychiatric/Behavioral: Negative for confusion. The patient is nervous/anxious.   Rest see pertinent positives and negatives per HPI.  Current Outpatient Medications on File Prior to Visit  Medication Sig Dispense Refill  . ALPRAZolam (XANAX) 0.25 MG tablet TAKE 1 TABLET BY MOUTH ONCE DAILY AS NEEDED FOR ANXIETY OR SLEEP (Patient taking differently: Take 0.25 mg by mouth daily as needed for anxiety or sleep.) 30 tablet 0  . atorvastatin (LIPITOR) 20 MG tablet Take 1 tablet (20 mg total) by mouth daily. 90 tablet 3  . carvedilol (COREG) 12.5 MG tablet Take 1 tablet (12.5 mg total) by mouth 2 (two) times daily with a meal. 180 tablet 0  . cholecalciferol (VITAMIN D3) 25 MCG (1000 UNIT) tablet Take 1,000 Units by mouth daily.    . diphenhydrAMINE (BENADRYL) 25 MG tablet Take 25 mg by mouth daily as needed for allergies.    . DULoxetine (CYMBALTA) 30 MG capsule Take 1 capsule (30 mg total) by mouth daily. 30 capsule 2  . FEMRING 0.05 MG/24HR RING SMARTSIG:1 Ring Vaginal Every 3 Months    . gabapentin (NEURONTIN) 400 MG capsule Take 400 mg by mouth 3 (three) times daily.    . meclizine (  ANTIVERT) 25 MG tablet Take 1 tablet (25 mg total) by mouth 2 (two) times daily as needed for dizziness. 20 tablet 0  . methocarbamol (ROBAXIN) 500 MG tablet Take 500 mg by mouth every 8 (eight) hours as needed for muscle spasms.    [name] omeprazole (PRILOSEC) 40 MG capsule Take 1 capsule (40 mg total)  by mouth daily. 30 capsule 0  . polyethylene glycol (MIRALAX / GLYCOLAX) packet Take 17 g by mouth daily. 30 each 0  . traMADol (ULTRAM) 50 MG tablet tramadol 50 mg tablet  one po tid prn pain     No current facility-administered medications on file prior to visit.   Past Medical History:  Diagnosis Date  . Anxiety   . Back pain    related to spinal stenosis and disc problem, radiates down left buttocks to leg., weakness occ.  [name] Dyspnea   . Headache   . Hyperlipidemia   . Hypertension   . Lumbar post-laminectomy syndrome   . PONV (postoperative nausea and vomiting)   . Vaginal foreign object    "Uses Femring"   Allergies  Allergen Reactions  . Cephalosporins Anaphylaxis  . Penicillins Anaphylaxis and Hives    Did it involve swelling of the face/tongue/throat, SOB, or low BP? Yes Did it involve sudden or severe rash/hives, skin peeling, or any reaction on the inside of your mouth or nose? Yes Did you need to seek medical attention at a hospital or doctor's office? Yes When did it last happen?10+ years If all above answers are "NO", may proceed with cephalosporin use.    . Anesthetics, Amide Nausea And Vomiting    Does not know name of it. States they put it on record foot center.   . Betadine [Povidone Iodine] Other (See Comments)    "Skin Burn" caused scar on Left buttock  . Peach [Prunus Persica] Hives  . Claritin [Loratadine]     Rash, pruritis   . Latex Hives, Itching and Rash    Social History   Socioeconomic History  . Marital status: Married    Spouse name: Not on file  . Number of children: Not on file  . Years of education: Not on file  . Highest education level: Not on file  Occupational History  . Not on file  Tobacco Use  . Smoking status: Never Smoker  . Smokeless tobacco: Never Used  Vaping Use  . Vaping Use: Never used  Substance and Sexual Activity  . Alcohol use: No  . Drug use: No  . Sexual activity: Yes    Partners: Male    Birth  control/protection: Post-menopausal  Other Topics Concern  . Not on file  Social History Narrative  . Not on file   Social Determinants of Health   Financial Resource Strain: Not on file  Food Insecurity: Not on file  Transportation Needs: Not on file  Physical Activity: Not on file  Stress: Not on file  Social Connections: Not on file    Vitals:   12/28/20 0844  BP: 128/80  Pulse: 88  Resp: 12  SpO2: 97%   Body mass index is 27.96 kg/m.  Physical Exam Vitals and nursing note reviewed.  Constitutional:      General: She is not in acute distress.    Appearance: She is well-developed.  HENT:     Head: Normocephalic and atraumatic.     Mouth/Throat:     Mouth: Oropharynx is clear and moist. Mucous membranes are dry.  Eyes:  Conjunctiva/sclera: Conjunctivae normal.  Cardiovascular:     Rate and Rhythm: Normal rate and regular rhythm.     Pulses:          Posterior tibial pulses are 2+ on the right side and 2+ on the left side.     Heart sounds: No murmur heard.   Pulmonary:     Effort: Pulmonary effort is normal. No respiratory distress.     Breath sounds: Normal breath sounds.  Abdominal:     Palpations: Abdomen is soft. There is no mass.     Tenderness: There is no abdominal tenderness.  Musculoskeletal:        General: No edema.     Comments: Left tinel and Phalen positive. No signs of synovitis.  Lymphadenopathy:     Cervical: No cervical adenopathy.  Skin:    General: Skin is warm.     Findings: No erythema or rash.  Neurological:     Mental Status: She is alert and oriented to person, place, and time.     Cranial Nerves: No cranial nerve deficit.     Deep Tendon Reflexes: Strength normal.     Comments: Unstable gait, it is assisted by a walker.  Psychiatric:        Mood and Affect: Mood and affect normal.     Comments: Well groomed, good eye contact.   ASSESSMENT AND PLAN:  Ms.Pavielle was seen today for hospitalization follow-up.  Diagnoses  and all orders for this visit:  Carpal tunnel syndrome of left wrist We discussed differential Dx's. Wrist splint recommended to wear at night. If not improved in a few weeks, she may need local steroid injection. She follows with sport medicine.  Essential hypertension BP adequately controlled. Continue Carvedilol 12.5 mg bid and low salt diet. Continue monitoring BP regularly.  BPPV (benign paroxysmal positional vertigo), left Improved. Continue vestibular therapy and Meclizine 25 mg bid prn. Fall precautions discussed.  Dysphagia, unspecified type Resolved. For now continue GERD precautions. If problem reoccurs, she can resume Omeprazole 40 mg daily.  LVH (left ventricular hypertrophy) We discussed Dx and prognosis. Continue Carvedilol. She has appt with cardiologist.   Return in about 4 months (around 04/29/2021).  Delta Deshmukh G. [name], MD  [facility]. [place] office.  A few things to remember from today's visit:   Essential hypertension  BPPV (benign paroxysmal positional vertigo), left  Dysphagia, unspecified type  Carpal tunnel syndrome of left wrist  LVH (left ventricular hypertrophy)  If you need refills please call your pharmacy. Do not use My Chart to request refills or for acute issues that need immediate attention.   Carpal Tunnel Syndrome  Carpal tunnel syndrome is a condition that causes pain, weakness, and numbness in your hand and arm. Numbness is when you cannot feel an area in your body. The carpal tunnel is a narrow area that is on the palm side of your wrist. Repeated wrist motion or certain diseases may cause swelling in the tunnel. This swelling can pinch the main nerve in the wrist. This nerve is called the median nerve. What are the causes? This condition may be caused by:  Moving your hand and wrist over and over again while doing a task.  Injury to the wrist.  Arthritis.  A sac of fluid (cyst) or abnormal growth (tumor) in  the carpal tunnel.  Fluid buildup during pregnancy.  Use of tools that vibrate. Sometimes the cause is not known. What increases the risk? The following factors may make you  more likely to have this condition:  Having a job that makes you do these things: ? Move your hand over and over again. ? Work with tools that vibrate, such as drills or sanders.  Being a woman.  Having diabetes, obesity, thyroid problems, or kidney failure. What are the signs or symptoms? Symptoms of this condition include:  A tingling feeling in your fingers.  Tingling or loss of feeling in your hand.  Pain in your entire arm. This pain may get worse when you bend your wrist and elbow for a long time.  Pain in your wrist that goes up your arm to your shoulder.  Pain that goes down into your palm or fingers.  Weakness in your hands. You may find it hard to grab and hold items. You may feel worse at night. How is this treated? This condition may be treated with:  Lifestyle changes. You will be asked to stop or change the activity that caused your problem.  Doing exercises and activities that make bones, muscles, and tendons stronger (physical therapy).  Learning how to use your hand again (occupational therapy).  Medicines for pain and swelling. You may have injections in your wrist.  A wrist splint or brace.  Surgery. Follow these instructions at home: If you have a splint or brace:  Wear the splint or brace as told by your doctor. Take it off only as told by your doctor.  Loosen the splint if your fingers: ? Tingle. ? Become numb. ? Turn cold and blue.  Keep the splint or brace clean.  If the splint or brace is not waterproof: ? Do not let it get wet. ? Cover it with a watertight covering when you take a bath or a shower. Managing pain, stiffness, and swelling If told, put ice on the painful area:  If you have a removable splint or brace, remove it as told by your doctor.  Put  ice in a plastic bag.  Place a towel between your skin and the bag.  Leave the ice on for 20 minutes, 2-3 times per day. Do not fall asleep with the cold pack on your skin.  Take off the ice if your skin turns bright red. This is very important. If you cannot feel pain, heat, or cold, you have a greater risk of damage to the area. Move your fingers often to reduce stiffness and swelling.   General instructions  Take over-the-counter and prescription medicines only as told by your doctor.  Rest your wrist from any activity that may cause pain. If needed, talk with your boss at work about changes that can help your wrist heal.  Do exercises as told by your doctor, physical therapist, or occupational therapist.  Keep all follow-up visits. Contact a doctor if:  You have new symptoms.  Medicine does not help your pain.  Your symptoms get worse. Get help right away if:  You have very bad numbness or tingling in your wrist or hand. Summary  Carpal tunnel syndrome is a condition that causes pain in your hand and arm.  It is often caused by repeated wrist motions.  Lifestyle changes and medicines are used to treat this problem. Surgery may help in very bad cases.  Follow your doctor's instructions about wearing a splint, resting your wrist, keeping follow-up visits, and calling for help. This information is not intended to replace advice given to you by your health care provider. Make sure you discuss any questions you have with your  health care provider. Document Revised: 02/23/2020 Document Reviewed: 02/23/2020 Elsevier Patient Education  [place].   Please be sure medication list is accurate. If a new problem present, please set up appointment sooner than planned today.

## 2020-12-31 ENCOUNTER — Telehealth: Payer: Self-pay | Admitting: Internal Medicine

## 2020-12-31 NOTE — Telephone Encounter (Signed)
Pt c/o of Chest Pain: STAT if CP now or developed within 24 hours  1. Are you having CP right now? Yes , feels like someone is sitting on her chest .  She has had it since she got out of the hosp but it is getting worse   2. Are you experiencing any other symptoms (ex. SOB, nausea, vomiting, sweating)? Pt Denies non of those symptoms today .  She felt like she was going to pass out yesterday   3. How long have you been experiencing CP? Since she was discharged from the hosp   4. Is your CP continuous or coming and going? Continuous   5. Have you taken Nitroglycerin? No   Pt has an appt tomorrow morning with Lurena Joiner   ?

## 2020-12-31 NOTE — Telephone Encounter (Signed)
The patient called in with chest tightness. She state that she had been in the ED for syncope and tightness previously. She has an appointment tomorrow but called concerned because the tightness has not gotten any better. She stated that nothing she does relieves the pressure and it feels like an elephant is sitting on her chest.   ED precautions have been given and she did state that if it gets worse then she will go to the ED.   She has an appointment tomorrow.

## 2021-01-01 ENCOUNTER — Other Ambulatory Visit: Payer: Self-pay

## 2021-01-01 ENCOUNTER — Ambulatory Visit: Payer: PPO | Admitting: Cardiology

## 2021-01-01 ENCOUNTER — Encounter: Payer: Self-pay | Admitting: Cardiology

## 2021-01-01 VITALS — BP 144/98 | HR 98 | Ht 65.0 in | Wt 179.0 lb

## 2021-01-01 DIAGNOSIS — R931 Abnormal findings on diagnostic imaging of heart and coronary circulation: Secondary | ICD-10-CM | POA: Insufficient documentation

## 2021-01-01 DIAGNOSIS — R1310 Dysphagia, unspecified: Secondary | ICD-10-CM | POA: Diagnosis not present

## 2021-01-01 DIAGNOSIS — I10 Essential (primary) hypertension: Secondary | ICD-10-CM

## 2021-01-01 DIAGNOSIS — M48062 Spinal stenosis, lumbar region with neurogenic claudication: Secondary | ICD-10-CM

## 2021-01-01 DIAGNOSIS — R0789 Other chest pain: Secondary | ICD-10-CM

## 2021-01-01 DIAGNOSIS — R55 Syncope and collapse: Secondary | ICD-10-CM | POA: Insufficient documentation

## 2021-01-01 NOTE — Assessment & Plan Note (Signed)
Cardiology consult 12/16/2020

## 2021-01-01 NOTE — Assessment & Plan Note (Signed)
Septal hypertrophy- unable to do MRI secondary to spinal stimulator

## 2021-01-01 NOTE — Assessment & Plan Note (Signed)
Esophageal dilatation Feb 2022

## 2021-01-01 NOTE — Assessment & Plan Note (Signed)
Normal coronaries 2016 (Dr Terrence Dupont)

## 2021-01-01 NOTE — Patient Instructions (Signed)
Medication Instructions:  Continue current medications  *If you need a refill on your cardiac medications before your next appointment, please call your pharmacy*   Lab Work: None Ordered  Testing/Procedures: Your physician has requested that you have an echocardiogram 4 weeks. Echocardiography is a painless test that uses sound waves to create images of your heart. It provides your doctor with information about the size and shape of your heart and how well your heart's chambers and valves are working. This procedure takes approximately one hour. There are no restrictions for this procedure.   Follow-Up: At [facility], you and your health needs are our priority.  As part of our continuing mission to provide you with exceptional heart care, we have created designated Provider Care Teams.  These Care Teams include your primary Cardiologist (physician) and Advanced Practice Providers (APPs -  Physician Assistants and Nurse Practitioners) who all work together to provide you with the care you need, when you need it.  We recommend signing up for the patient portal called "MyChart".  Sign up information is provided on this After Visit Summary.  MyChart is used to connect with patients for Virtual Visits (Telemedicine).  Patients are able to view lab/test results, encounter notes, upcoming appointments, etc.  Non-urgent messages can be sent to your provider as well.   To learn more about what you can do with MyChart, go to [email or web address].    Your next appointment:   6 weeks  The format for your next appointment:   In Person  Provider:   You may see Pixie Casino, MD or one of the following Advanced Practice Providers on your designated Care Team:    Almyra Deforest, PA-C  Fabian Sharp, PA-C or   Roby Lofts, [place]

## 2021-01-01 NOTE — Assessment & Plan Note (Signed)
Controlled.  

## 2021-01-01 NOTE — Assessment & Plan Note (Signed)
S/P spinal stimulator

## 2021-01-01 NOTE — Progress Notes (Signed)
Cardiology Office Note:    Date:  01/01/2021   ID:  [name], DOB 1961-08-05, MRN [id number]  PCP:  [name], [name], MD  Cardiologist:  Cheryl Casino, MD  Electrophysiologist:  None   Referring MD: [name], [name], MD   CC: chest pain, nausea post hospital f/u  History of Present Illness:    [name] is a 60 y.o. female with a hx of hypertension, chest pain with normal coronaries in 2016 (Dr [name]), and spinal stenosis status post spinal stimulator implant.  The patient was admitted 12/15/2020 after a syncopal spell.  Cardiology was consulted after echocardiogram revealed septal hypertrophy.  Patient is unable to have a cardiac MRI secondary to her spinal stimulator.   She is in the office today for follow-up.  The patient says she feels "terrible".  She is complaining of chest pain.  She has left upper chest pain that is worse with deep inspiration and movement.  She says she feels nauseated.  EKG today shows sinus rhythm with no acute changes.  D-dimer done at the hospital last month was normal.  The patient's symptoms are not worse when she lays flat.    Past Medical History:  Diagnosis Date  . Anxiety   . Back pain    related to spinal stenosis and disc problem, radiates down left buttocks to leg., weakness occ.  [name] Dyspnea   . Headache   . Hyperlipidemia   . Hypertension   . Lumbar post-laminectomy syndrome   . PONV (postoperative nausea and vomiting)   . Vaginal foreign object    "Uses Femring"    Past Surgical History:  Procedure Laterality Date  . ABDOMINAL HYSTERECTOMY    . BIOPSY  12/16/2020   Procedure: BIOPSY;  Surgeon: Cheryl Artist, MD;  Location: [place];  Service: Endoscopy;;  . CARDIAC CATHETERIZATION N/A 04/18/2015   Procedure: Left Heart Cath and Coronary Angiography;  Surgeon: Cheryl Forward, MD;  Location: [place] [facility];  Service: Cardiovascular;  Laterality: N/A;  . COLONOSCOPY W/ BIOPSIES AND POLYPECTOMY     . ESOPHAGOGASTRODUODENOSCOPY (EGD) WITH PROPOFOL N/A 12/16/2020   Procedure: ESOPHAGOGASTRODUODENOSCOPY (EGD) WITH PROPOFOL;  Surgeon: Cheryl Artist, MD;  Location: [facility] ENDOSCOPY;  Service: Endoscopy;  Laterality: N/A;  . FOOT SURGERY Bilateral    [place] "bunion,bone spur, tendon" (1) -6'16, (1)-10'16  . IR EPIDUROGRAPHY  07/21/2018  . LUMBAR LAMINECTOMY/DECOMPRESSION MICRODISCECTOMY Bilateral 12/28/2015   Procedure: MICRO LUMBAR DECOMPRESSION L4 - L5 BILATERALLY;  Surgeon: Cheryl Day, MD;  Location: [facility];  Service: Orthopedics;  Laterality: Bilateral;  . LUMBAR LAMINECTOMY/DECOMPRESSION MICRODISCECTOMY Bilateral 03/04/2018   Procedure: Revision of Microlumbar Decompression Bilateral Lumbar Four-Five;  Surgeon: Cheryl Day, MD;  Location: [place];  Service: Orthopedics;  Laterality: Bilateral;  90 mins  . SAVORY DILATION N/A 12/16/2020   Procedure: SAVORY DILATION;  Surgeon: Cheryl Artist, MD;  Location: [facility] ENDOSCOPY;  Service: Endoscopy;  Laterality: N/A;  . SPINAL CORD STIMULATOR INSERTION N/A 09/28/2019   Procedure: THORACIC SPINAL CORD STIMULATOR INSERTION;  Surgeon: Cheryl Schools, MD;  Location: [place];  Service: Orthopedics;  Laterality: N/A;  2.5 hrs  . TUBAL LIGATION    . WISDOM TOOTH EXTRACTION    . WOUND EXPLORATION N/A 03/04/2018   Procedure: EXPLORATION OF LUMBAR DECOMPRESSION WOUND;  Surgeon: Cheryl Day, MD;  Location: [place];  Service: Orthopedics;  Laterality: N/A;    Current Medications: Current Meds  Medication Sig  . ALPRAZolam (XANAX) 0.25 MG tablet TAKE 1  TABLET BY MOUTH ONCE DAILY AS NEEDED FOR ANXIETY OR SLEEP (Patient taking differently: Take 0.25 mg by mouth daily as needed for anxiety or sleep.)  . atorvastatin (LIPITOR) 20 MG tablet Take 1 tablet (20 mg total) by mouth daily.  . carvedilol (COREG) 12.5 MG tablet Take 1 tablet (12.5 mg total) by mouth 2 (two) times daily with a meal.  . cholecalciferol (VITAMIN D3) 25 MCG (1000 UNIT) tablet Take  1,000 Units by mouth daily.  . diphenhydrAMINE (BENADRYL) 25 MG tablet Take 25 mg by mouth daily as needed for allergies.  . DULoxetine (CYMBALTA) 30 MG capsule Take 1 capsule (30 mg total) by mouth daily.  [name] FEMRING 0.05 MG/24HR RING SMARTSIG:1 Ring Vaginal Every 3 Months  . gabapentin (NEURONTIN) 400 MG capsule Take 400 mg by mouth 3 (three) times daily.  . meclizine (ANTIVERT) 25 MG tablet Take 1 tablet (25 mg total) by mouth 2 (two) times daily as needed for dizziness.  . methocarbamol (ROBAXIN) 500 MG tablet Take 500 mg by mouth every 8 (eight) hours as needed for muscle spasms.  [name] omeprazole (PRILOSEC) 40 MG capsule Take 1 capsule (40 mg total) by mouth daily.  . polyethylene glycol (MIRALAX / GLYCOLAX) packet Take 17 [name] by mouth daily.  . traMADol (ULTRAM) 50 MG tablet tramadol 50 mg tablet  one po tid prn pain     Allergies:   Cephalosporins; Penicillins; Anesthetics, amide; Betadine [povidone iodine]; Peach [prunus persica]; Claritin [loratadine]; and Latex   Social History   Socioeconomic History  . Marital status: Married    Spouse name: Not on file  . Number of children: Not on file  . Years of education: Not on file  . Highest education level: Not on file  Occupational History  . Not on file  Tobacco Use  . Smoking status: Never Smoker  . Smokeless tobacco: Never Used  Vaping Use  . Vaping Use: Never used  Substance and Sexual Activity  . Alcohol use: No  . Drug use: No  . Sexual activity: Yes    Partners: Male    Birth control/protection: Post-menopausal  Other Topics Concern  . Not on file  Social History Narrative  . Not on file   Social Determinants of Health   Financial Resource Strain: Not on file  Food Insecurity: Not on file  Transportation Needs: Not on file  Physical Activity: Not on file  Stress: Not on file  Social Connections: Not on file     Family History: The patient's family history includes Breast cancer in her sister; Breast cancer  (age of onset: 50) in her sister; Cancer in her father; Heart attack in her mother and sister; Lung cancer in her father; Multiple myeloma in her sister; Pancreatic cancer in her sister; Stomach cancer in her cousin; Throat cancer in her brother. There is no history of Colon cancer.  ROS:   Please see the history of present illness.     All other systems reviewed and are negative.  EKGs/Labs/Other Studies Reviewed:    The following studies were reviewed today: Echo 12/15/2020- IMPRESSIONS    1. Left ventricular ejection fraction, by estimation, is 65 to 70%. The  left ventricle has normal function. The left ventricle has no regional  wall motion abnormalities. There is severe asymmetric left ventricular  hypertrophy of the basal-septal  segment. The IVSd is 1.9 cm - no LVOT obstruction is noted at rest.  Valsalva not performed. Findings suggest possible hypertrophic  cardiomyopathy. Left ventricular diastolic  parameters are consistent with  Grade I diastolic dysfunction (impaired  relaxation).  2. Right ventricular systolic function is normal. The right ventricular  size is normal. There is normal pulmonary artery systolic pressure.  3. The aortic valve is tricuspid. Aortic valve regurgitation is not  visualized.  4. The inferior vena cava is normal in size with greater than 50%  respiratory variability, suggesting right atrial pressure of 3 mmHg.  5. The mitral valve is grossly normal. Trivial mitral valve  regurgitation.   EKG:  EKG is ordered today.  The ekg ordered today demonstrates NSR, HR 96  Recent Labs: 04/20/2020: ALT 19 12/14/2020: TSH 3.111 12/19/2020: BUN 12; Creatinine, Ser 1.01; Hemoglobin 12.4; Platelets 268; Potassium 4.4; Sodium 137  Recent Lipid Panel    Component Value Date/Time   CHOL 198 04/20/2020 0955   TRIG 62.0 04/20/2020 0955   HDL 61.70 04/20/2020 0955   CHOLHDL 3 04/20/2020 0955   VLDL 12.4 04/20/2020 0955   LDLCALC 123 (H) 04/20/2020 0955     Physical Exam:    VS:  BP (!) 144/98   Pulse 98   Ht 5\' 5"  (1.651 m)   Wt 179 lb (81.2 kg)   SpO2 96%   BMI 29.79 kg/m     Wt Readings from Last 3 Encounters:  01/01/21 179 lb (81.2 kg)  12/26/20 168 lb (76.2 kg)  12/14/20 176 lb 9.4 oz (80.1 kg)     GEN:  Well nourished, well developed female, c/o chest pain and nausea HEENT: Normal NECK: No JVD CARDIAC: RRR, no murmurs, rubs, gallops RESPIRATORY:  Clear to auscultation without rales, wheezing or rhonchi  ABDOMEN: non-distended MUSCULOSKELETAL:  No edema; No deformity  SKIN: Warm and dry NEUROLOGIC:  Alert and oriented x 3 PSYCHIATRIC:  Normal affect   ASSESSMENT:    Syncope and collapse Cardiology consult 12/16/2020  Abnormal echocardiogram Septal hypertrophy- unable to do MRI secondary to spinal stimulator  Chest pain Normal coronaries 2016 (Dr 2017)  Essential hypertension Controlled  Spinal stenosis of lumbar region S/P spinal stimulator  Dysphagia Esophageal dilatation Feb 2022  PLAN:    I am not sure what Ms. Thorington symptoms are secondary to.  It does not appear to be cardiac.  Her chest pain is atypical.  She had normal coronaries in 2016.  Her EKG has no acute changes. She has no rub on exam.  Her recent D-dimer was normal.  Her echocardiogram did show ASH but no obstruction.  I suggested she go to the emergency room for further evaluation if she continues to feel this way and she will consider this.  I did order a follow-up echocardiogram in the next few weeks.  She has a monitor in place and this will need to be followed up as well.   Medication Adjustments/Labs and Tests Ordered: Current medicines are reviewed at length with the patient today.  Concerns regarding medicines are outlined above.  Orders Placed This Encounter  Procedures  . EKG 12-Lead  . ECHOCARDIOGRAM COMPLETE   No orders of the defined types were placed in this encounter.   Patient Instructions  Medication  Instructions:  Continue current medications  *If you need a refill on your cardiac medications before your next appointment, please call your pharmacy*   Lab Work: None Ordered  Testing/Procedures: Your physician has requested that you have an echocardiogram 4 weeks. Echocardiography is a painless test that uses sound waves to create images of your heart. It provides your doctor with information about the size  and shape of your heart and how well your heart's chambers and valves are working. This procedure takes approximately one hour. There are no restrictions for this procedure.   Follow-Up: At [facility], you and your health needs are our priority.  As part of our continuing mission to provide you with exceptional heart care, we have created designated Provider Care Teams.  These Care Teams include your primary Cardiologist (physician) and Advanced Practice Providers (APPs -  Physician Assistants and Nurse Practitioners) who all work together to provide you with the care you need, when you need it.  We recommend signing up for the patient portal called "MyChart".  Sign up information is provided on this After Visit Summary.  MyChart is used to connect with patients for Virtual Visits (Telemedicine).  Patients are able to view lab/test results, encounter notes, upcoming appointments, etc.  Non-urgent messages can be sent to your provider as well.   To learn more about what you can do with MyChart, go to [email or web address].    Your next appointment:   6 weeks  The format for your next appointment:   In Person  Provider:   You may see Cheryl Casino, MD or one of the following Advanced Practice Providers on your designated Care Team:    Almyra Deforest, PA-C  Fabian Sharp, PA-C or   Roby Lofts, PA-C        Signed, Kerin Ransom, [place]  01/01/2021 10:37 AM    [place]

## 2021-01-03 ENCOUNTER — Ambulatory Visit: Payer: PPO | Attending: Registered Nurse | Admitting: Physical Therapy

## 2021-01-03 ENCOUNTER — Encounter: Payer: Self-pay | Admitting: Physical Therapy

## 2021-01-03 ENCOUNTER — Other Ambulatory Visit: Payer: Self-pay

## 2021-01-03 DIAGNOSIS — R42 Dizziness and giddiness: Secondary | ICD-10-CM | POA: Diagnosis not present

## 2021-01-03 DIAGNOSIS — H8112 Benign paroxysmal vertigo, left ear: Secondary | ICD-10-CM | POA: Insufficient documentation

## 2021-01-04 NOTE — Therapy (Signed)
[facility] [address] [place], [name], [address] Phone: [phone]   Fax:  [phone]  Physical Therapy Evaluation  Patient Details  Name: [name] MRN: [id number] Date of Birth: October 21, 1961 Referring Provider (PT): Alger Simons, MD   Encounter Date: 01/03/2021   PT End of Session - 01/04/21 1053    Visit Number 1    Number of Visits 5   4 visits requested for vestibular tx   Authorization Type 01-03-21 - 03-05-21    PT Start Time 0933    PT Stop Time 1016    PT Time Calculation (min) 43 min    Activity Tolerance Other (comment)   limited by severity of dizziness in test positions   Behavior During Therapy Anxious           Past Medical History:  Diagnosis Date  . Anxiety   . Back pain    related to spinal stenosis and disc problem, radiates down left buttocks to leg., weakness occ.  [name] Dyspnea   . Headache   . Hyperlipidemia   . Hypertension   . Lumbar post-laminectomy syndrome   . PONV (postoperative nausea and vomiting)   . Vaginal foreign object    "Uses Femring"    Past Surgical History:  Procedure Laterality Date  . ABDOMINAL HYSTERECTOMY    . BIOPSY  12/16/2020   Procedure: BIOPSY;  Surgeon: Ladene Artist, MD;  Location: [place];  Service: Endoscopy;;  . CARDIAC CATHETERIZATION N/A 04/18/2015   Procedure: Left Heart Cath and Coronary Angiography;  Surgeon: [name], MD;  Location: [facility];  Service: Cardiovascular;  Laterality: N/A;  . COLONOSCOPY W/ BIOPSIES AND POLYPECTOMY    . ESOPHAGOGASTRODUODENOSCOPY (EGD) WITH PROPOFOL N/A 12/16/2020   Procedure: ESOPHAGOGASTRODUODENOSCOPY (EGD) WITH PROPOFOL;  Surgeon: Ladene Artist, MD;  Location: [facility] ENDOSCOPY;  Service: Endoscopy;  Laterality: N/A;  . FOOT SURGERY Bilateral    [place] "bunion,bone spur, tendon" (1) -6'16, (1)-10'16  . IR EPIDUROGRAPHY  07/21/2018  . LUMBAR LAMINECTOMY/DECOMPRESSION  MICRODISCECTOMY Bilateral 12/28/2015   Procedure: MICRO LUMBAR DECOMPRESSION L4 - L5 BILATERALLY;  Surgeon: [name], MD;  Location[facility];  Service: Orthopedics;  Laterality: Bilateral;  . LUMBAR LAMINECTOMY/DECOMPRESSION MICRODISCECTOMY Bilateral 03/04/2018   Procedure: Revision of Microlumbar Decompression Bilateral Lumbar Four-Five;  Surgeon: [name], MD;  Location: [facility];  Service: Orthopedics;  Laterality: Bilateral;  90 mins  . SAVORY DILATION N/A 12/16/2020   Procedure: SAVORY DILATION;  Surgeon: Ladene Artist, MD;  Location: [facility] ENDOSCOPY;  Service: Endoscopy;  Laterality: N/A;  . SPINAL CORD STIMULATOR INSERTION N/A 09/28/2019   Procedure: THORACIC SPINAL CORD STIMULATOR INSERTION;  Surgeon: Melina Schools, MD;  Location: [place];  Service: Orthopedics;  Laterality: N/A;  2.5 hrs  . TUBAL LIGATION    . WISDOM TOOTH EXTRACTION    . WOUND EXPLORATION N/A 03/04/2018   Procedure: EXPLORATION OF LUMBAR DECOMPRESSION WOUND;  Surgeon: [name], MD;  Location: [facility];  Service: Orthopedics;  Laterality: N/A;    There were no vitals filed for this visit.    Subjective Assessment - 01/03/21 0945    Subjective Pt was admitted to [facility] on 12-14-20 with syncope and chest pain; was discharged home on 12-20-20, had dizziness during hospitalization:  states she does not feel any dizziness at this time but states she gets very dizzy if she turns her head quickly to the left- has been moving slowly and avoiding movements that provoke it  Pertinent History decompression and laminectomy on 12/28/15 at L4-L5  with redo decompression surgery on May 9,2019, post-op paraparesis with surgery to remove small hematoma; 07/12/18 fall at work (involved rolling chair) with hospital admission, transfer to [place] with d/c home 08/03/18; HTN, Bil foot surgery     Patient Stated Goals resolve the vertigo    Currently in Pain? Yes    Pain Score 8     Pain Location Chest    Pain Orientation Left    Pain  Descriptors / Indicators Pressure    Pain Type Acute pain    Pain Onset 1 to 4 weeks ago    Pain Frequency Intermittent              [facility] PT Assessment - 01/04/21 0001      Assessment   Medical Diagnosis BPPV    Referring Provider (PT) Alger Simons, MD    Onset Date/Surgical Date 12/14/20    Prior Therapy pt has been receiving aquatic therapy at this facility for leg weakness due to lumbar radiculopathy      Precautions   Precautions Other (comment)   has spinal pain stimulator implant     Balance Screen   Has the patient fallen in the past 6 months No    Has the patient had a decrease in activity level because of a fear of falling?  No    Is the patient reluctant to leave their home because of a fear of falling?  No      Prior Function   Level of Independence Independent with basic ADLs;Independent with household mobility with device;Independent with community mobility with device      Observation/Other Assessments   Focus on Therapeutic Outcomes (FOTO)  46/100 with risk adjusted 46/100    Other Surveys  --   DFS 44.2%                 Vestibular Assessment - 01/04/21 1047      Symptom Behavior   Subjective history of current problem dizziness started during her hospitalization for syncope and chest pain - pt has been diagnosed with hypertrophy of her heart    Type of Dizziness  Spinning    Frequency of Dizziness varies    Duration of Dizziness minutes    Symptom Nature Positional    Aggravating Factors Rolling to left    Relieving Factors Lying supine;Head stationary    Progression of Symptoms Better    History of similar episodes pt had PT in hospital to treat Lt BPPV      Oculomotor Exam   Oculomotor Alignment Normal      Positional Testing   Dix-Hallpike Dix-Hallpike Right;Dix-Hallpike Left    Sidelying Test Sidelying Right;Sidelying Left      Dix-Hallpike Right   Dix-Hallpike Right Duration approx. 5 secs    Dix-Hallpike Right Symptoms No  nystagmus      Dix-Hallpike Left   Dix-Hallpike Left Duration approx. 15 secs    Dix-Hallpike Left Symptoms No nystagmus      Sidelying Right   Sidelying Right Duration approx. 15 secs - pt also reported increased chest pain in this position (pt is wearing heart monitor)    Sidelying Right Symptoms No nystagmus      Sidelying Left   Sidelying Left Duration approx. 20 secs    Sidelying Left Symptoms No nystagmus;Other (comment)   pt states she took Meclizine prior to appt today  Objective measurements completed on examination: See above findings.               PT Education - 01/04/21 1052    Education Details educated on BPPV etiology    Person(s) Educated Patient    Methods Explanation    Comprehension Verbalized understanding            PT Short Term Goals - 01/04/21 1101      PT SHORT TERM GOAL #1   Title Same as LTG's             PT Long Term Goals - 01/04/21 1101      PT LONG TERM GOAL #1   Title Pt will report no dizziness with any bed mobility.    Time 3    Period Weeks    Status New    Target Date 01/25/21      PT LONG TERM GOAL #2   Title Pt will have a (-) Lt sidelying test with no dizziness and no nystagmus to indicate resolution of Lt BPPV.    Time 3    Period Weeks    Status New    Target Date 01/25/21      PT LONG TERM GOAL #3   Title Pt will improve FOTO score from 46/100 to >/= 56/100 to demo improvement in vertigo.    Baseline 46/100    Time 3    Period Weeks    Status New    Target Date 01/25/21      PT LONG TERM GOAL #4   Title Independent in HEP for vestibular/habituation exercises.    Time 3    Period Weeks    Status New    Target Date 01/25/21                  Plan - 01/04/21 1055    Clinical Impression Statement Pt has c/o dizziness in Lt sidelying position but no nystagmus was noted, possibly due to pt having taken Meclizine prior to today's appt.  Pt also reported dizziness in Rt  sidelying position but attributed this to anxiety as she had more chest discomfort in Rt sidelying position than in Lt sidelying position.  Pt is wearing heart monitor due to possible cardiac hypertrophy.  Advised pt to not take Meclizine prior to next scheduled appt but to bring medication with her to take if needed after tx completed.  Pt's symptoms are consistent with Lt BPPV at this time.    Personal Factors and Comorbidities Past/Current Experience;Comorbidity 2;Time since onset of injury/illness/exacerbation    Examination-Activity Limitations Stand;Locomotion Level;Bend;Squat;Stairs;Transfers;Carry;Reach Overhead    Examination-Participation Restrictions Cleaning;Community Activity;Meal Prep;Shop;Laundry    Stability/Clinical Decision Making Stable/Uncomplicated    Rehab Potential Good    PT Frequency 2x / week    PT Duration 3 weeks    PT Treatment/Interventions Canalith Repostioning;ADLs/Self Care Home Management;Neuromuscular re-education;Balance training;Patient/family education    PT Next Visit Plan recheck Lt BPPV    Consulted and Agree with Plan of Care Patient           Patient will benefit from skilled therapeutic intervention in order to improve the following deficits and impairments:  Pain,Postural dysfunction,Difficulty walking,Decreased strength,Decreased activity tolerance,Abnormal gait,Increased muscle spasms,Decreased endurance,Decreased balance,Decreased coordination,Decreased knowledge of use of DME,Decreased mobility,Decreased safety awareness,Impaired sensation,Impaired tone  Visit Diagnosis: BPPV (benign paroxysmal positional vertigo), left - Plan: PT plan of care cert/re-cert  Dizziness and giddiness - Plan: PT plan of care cert/re-cert     Problem List Patient  Active Problem List   Diagnosis Date Noted  . Syncope and collapse 01/01/2021  . Abnormal echocardiogram 01/01/2021  . BPPV (benign paroxysmal positional vertigo), left 12/26/2020  . Odynophagia    . Dysphagia 12/15/2020  . Postural dizziness with presyncope 12/14/2020  . Lumbar post-laminectomy syndrome   . Chronic pain 09/28/2019  . Lumbar spinal stenosis   . Radicular pain   . Gait disorder   . Difficulty in urination 07/13/2018  . Back pain 07/12/2018  . Hyperlipidemia 04/16/2018  . Anxiety disorder, unspecified 04/16/2018  . AKI (acute kidney injury) ([place])   . Benign essential HTN   . Major depression, recurrent ([place])   . Lumbar radiculopathy 03/09/2018  . Paraparesis ([place]) 03/09/2018  . Essential hypertension   . Chronic nonintractable headache   . Leukocytosis   . Acute blood loss anemia   . Postoperative pain   . Generalized weakness   . Spinal stenosis at L4-L5 level 03/04/2018  . Spinal stenosis of lumbar region 12/28/2015  . Chest pain 04/16/2015    Alda Lea, PT 01/04/2021, [facility] [address] [place], [name], [address] Phone: [phone]   Fax:  [phone]  Name: [name] MRN: [id number] Date of Birth: 24-Mar-1961

## 2021-01-07 ENCOUNTER — Other Ambulatory Visit: Payer: Self-pay

## 2021-01-07 ENCOUNTER — Ambulatory Visit: Payer: PPO | Admitting: Physical Therapy

## 2021-01-07 DIAGNOSIS — H8112 Benign paroxysmal vertigo, left ear: Secondary | ICD-10-CM | POA: Diagnosis not present

## 2021-01-07 NOTE — Patient Instructions (Addendum)
Self Treatment for Left Posterior / Anterior Canalithiasis    Sitting on bed: 1. Turn head 45 left. (a) Lie back slowly, shoulders on pillow, head on bed. (b) Hold _20___ seconds. 2. Keeping head on bed, turn head 90 right. Hold __20__ seconds. 3. Roll to right, head on 45 angle down toward bed. Hold _20___ seconds. 4. Sit up on right side of bed. Repeat _3___ times per session. Do __2__ sessions per day.  Copyright  VHI. All rights reserved.   

## 2021-01-08 ENCOUNTER — Encounter: Payer: Self-pay | Admitting: Physical Therapy

## 2021-01-08 NOTE — Therapy (Signed)
[facility] [address] [place], [name], [address] Phone: [phone]   Fax:  [phone]  Physical Therapy Treatment  Patient Details  Name: [name] MRN: [id number] Date of Birth: Jun 19, 1961 Referring Provider (PT): Alger Simons, MD   Encounter Date: 01/07/2021   PT End of Session - 01/08/21 2152    Visit Number 2    Number of Visits 5   4 visits requested for vestibular tx   Authorization Type 01-03-21 - 03-05-21    PT Start Time 0936    PT Stop Time 1015    PT Time Calculation (min) 39 min    Activity Tolerance Other (comment)   limited by severity of dizziness in test positions   Behavior During Therapy Anxious           Past Medical History:  Diagnosis Date  . Anxiety   . Back pain    related to spinal stenosis and disc problem, radiates down left buttocks to leg., weakness occ.  [name] Dyspnea   . Headache   . Hyperlipidemia   . Hypertension   . Lumbar post-laminectomy syndrome   . PONV (postoperative nausea and vomiting)   . Vaginal foreign object    "Uses Femring"    Past Surgical History:  Procedure Laterality Date  . ABDOMINAL HYSTERECTOMY    . BIOPSY  12/16/2020   Procedure: BIOPSY;  Surgeon: Ladene Artist, MD;  Location: [facility];  Service: Endoscopy;;  . CARDIAC CATHETERIZATION N/A 04/18/2015   Procedure: Left Heart Cath and Coronary Angiography;  Surgeon: [name], MD;  Location: [facility];  Service: Cardiovascular;  Laterality: N/A;  . COLONOSCOPY W/ BIOPSIES AND POLYPECTOMY    . ESOPHAGOGASTRODUODENOSCOPY (EGD) WITH PROPOFOL N/A 12/16/2020   Procedure: ESOPHAGOGASTRODUODENOSCOPY (EGD) WITH PROPOFOL;  Surgeon: Ladene Artist, MD;  Location: [facility];  Service: Endoscopy;  Laterality: N/A;  . FOOT SURGERY Bilateral    [place] "bunion,bone spur, tendon" (1) -6'16, (1)-10'16  . IR EPIDUROGRAPHY  07/21/2018  . LUMBAR LAMINECTOMY/DECOMPRESSION  MICRODISCECTOMY Bilateral 12/28/2015   Procedure: MICRO LUMBAR DECOMPRESSION L4 - L5 BILATERALLY;  Surgeon: [name], MD;  Location: [facility];  Service: Orthopedics;  Laterality: Bilateral;  . LUMBAR LAMINECTOMY/DECOMPRESSION MICRODISCECTOMY Bilateral 03/04/2018   Procedure: Revision of Microlumbar Decompression Bilateral Lumbar Four-Five;  Surgeon: [name], MD;  Location: [facility];  Service: Orthopedics;  Laterality: Bilateral;  90 mins  . SAVORY DILATION N/A 12/16/2020   Procedure: SAVORY DILATION;  Surgeon: Ladene Artist, MD;  Location: [facility] END[facility];  Service: Endoscopy;  Laterality: N/A;  . SPINAL CORD STIMULATOR INSERTION N/A 09/28/2019   Procedure: THORACIC SPINAL CORD STIMULATOR INSERTION;  Surgeon: Melina Schools, MD;  Location: [facility];  Service: Orthopedics;  Laterality: N/A;  2.5 hrs  . TUBAL LIGATION    . WISDOM TOOTH EXTRACTION    . WOUND EXPLORATION N/A 03/04/2018   Procedure: EXPLORATION OF LUMBAR DECOMPRESSION WOUND;  Surgeon: [name], MD;  Location: [facility];  Service: Orthopedics;  Laterality: N/A;    There were no vitals filed for this visit.   Subjective Assessment - 01/08/21 2140    Subjective Pt states the dizziness is not as bad as what it was last week at eval; pt states she still feels it some but not like she did    Pertinent History decompression and laminectomy on 12/28/15 at L4-L5  with redo decompression surgery on May 9,2019, post-op paraparesis with surgery to remove small hematoma; 07/12/18 fall at  work (involved rolling chair) with hospital admission, transfer to [facility] with d/c home 08/03/18; HTN, Bil foot surgery     Patient Stated Goals resolve the vertigo    Currently in Pain? Yes    Pain Score 5     Pain Location Back    Pain Orientation Right    Pain Descriptors / Indicators Aching;Discomfort    Pain Type Chronic pain    Pain Onset More than a month ago    Pain Frequency Constant                              Vestibular  Treatment/Exercise - 01/08/21 0001      Vestibular Treatment/Exercise   Vestibular Treatment Provided Canalith Repositioning    Canalith Repositioning Epley Manuever Left    Habituation Exercises Brandt Daroff       EPLEY MANUEVER LEFT   Number of Reps  3    Overall Response  Improved Symptoms     RESPONSE DETAILS LEFT no nystagmus noted but pt reported less vertigo on reps 2 & 3 compared to rep [place]   Number of Reps  3    Symptom Description  symptoms improved                 PT Education - 01/08/21 2147    Education Details self treatment for Epley maneuver Lt side    Person(s) Educated Patient    Methods Explanation;Demonstration;Handout    Comprehension Verbalized understanding;Returned demonstration            PT Short Term Goals - 01/04/21 1101      PT SHORT TERM GOAL #1   Title Same as LTG's             PT Long Term Goals - 01/08/21 2156      PT LONG TERM GOAL #1   Title Pt will report no dizziness with any bed mobility.    Time 3    Period Weeks    Status New      PT LONG TERM GOAL #2   Title Pt will have a (-) Lt sidelying test with no dizziness and no nystagmus to indicate resolution of Lt BPPV.    Time 3    Period Weeks    Status New      PT LONG TERM GOAL #3   Title Pt will improve FOTO score from 46/100 to >/= 56/100 to demo improvement in vertigo.    Baseline 46/100    Time 3    Period Weeks    Status New      PT LONG TERM GOAL #4   Title Independent in HEP for vestibular/habituation exercises.    Time 3    Period Weeks    Status New                 Plan - 01/08/21 2153    Clinical Impression Statement No nystagmus was noted in Lt Dix-Hallpike test position but pt reported mild dizziness on 1st rep of Epley maneuver.  No dizziness reported on reps 2 and 3.  Lt BPPV appears to be resolved.  Cont to assess vertigo.    Personal Factors and Comorbidities Past/Current Experience;Comorbidity 2;Time since onset  of injury/illness/exacerbation    Examination-Activity Limitations Stand;Locomotion Level;Bend;Squat;Stairs;Transfers;Carry;Reach Overhead    Examination-Participation Restrictions Cleaning;Community Activity;Meal Prep;Shop;Laundry    Stability/Clinical Decision Making Stable/Uncomplicated    Rehab Potential  Good    PT Frequency 2x / week    PT Duration 3 weeks    PT Treatment/Interventions Canalith Repostioning;ADLs/Self Care Home Management;Neuromuscular re-education;Balance training;Patient/family education    PT Next Visit Plan recheck Lt BPPV    Consulted and Agree with Plan of Care Patient           Patient will benefit from skilled therapeutic intervention in order to improve the following deficits and impairments:  Pain,Postural dysfunction,Difficulty walking,Decreased strength,Decreased activity tolerance,Abnormal gait,Increased muscle spasms,Decreased endurance,Decreased balance,Decreased coordination,Decreased knowledge of use of DME,Decreased mobility,Decreased safety awareness,Impaired sensation,Impaired tone  Visit Diagnosis: BPPV (benign paroxysmal positional vertigo), left     Problem List Patient Active Problem List   Diagnosis Date Noted  . Syncope and collapse 01/01/2021  . Abnormal echocardiogram 01/01/2021  . BPPV (benign paroxysmal positional vertigo), left 12/26/2020  . Odynophagia   . Dysphagia 12/15/2020  . Postural dizziness with presyncope 12/14/2020  . Lumbar post-laminectomy syndrome   . Chronic pain 09/28/2019  . Lumbar spinal stenosis   . Radicular pain   . Gait disorder   . Difficulty in urination 07/13/2018  . Back pain 07/12/2018  . Hyperlipidemia 04/16/2018  . Anxiety disorder, unspecified 04/16/2018  . AKI (acute kidney injury) ([place])   . Benign essential HTN   . Major depression, recurrent ([place])   . Lumbar radiculopathy 03/09/2018  . Paraparesis ([place]) 03/09/2018  . Essential hypertension   . Chronic nonintractable headache   .  Leukocytosis   . Acute blood loss anemia   . Postoperative pain   . Generalized weakness   . Spinal stenosis at L4-L5 level 03/04/2018  . Spinal stenosis of lumbar region 12/28/2015  . Chest pain 04/16/2015    Alda Lea, PT 01/08/2021, 9:59 PM  [facility] [address] [place], [name], [address] Phone: [phone]   Fax:  [phone]  Name: [name] MRN: [id number] Date of Birth: 1961/07/17

## 2021-01-14 ENCOUNTER — Ambulatory Visit: Payer: PPO | Admitting: Physical Therapy

## 2021-01-14 ENCOUNTER — Other Ambulatory Visit: Payer: Self-pay

## 2021-01-14 DIAGNOSIS — R42 Dizziness and giddiness: Secondary | ICD-10-CM

## 2021-01-14 DIAGNOSIS — H8112 Benign paroxysmal vertigo, left ear: Secondary | ICD-10-CM | POA: Diagnosis not present

## 2021-01-15 DIAGNOSIS — M5459 Other low back pain: Secondary | ICD-10-CM | POA: Diagnosis not present

## 2021-01-15 NOTE — Therapy (Signed)
[facility] [address] [place], [name], [address] Phone: [phone]   Fax:  [phone]  Physical Therapy Treatment  Patient Details  Name: [name] MRN: [id number] Date of Birth: [birth year] Referring Provider (PT): Alger Simons, MD   Encounter Date: 01/14/2021   PT End of Session - 01/15/21 1630    Visit Number 3    Number of Visits 5   4 visits requested for vestibular tx   Authorization Type 01-03-21 - 03-05-21    PT Start Time 1115   pt arrived 15" late   PT Stop Time 1146    PT Time Calculation (min) 31 min    Activity Tolerance Other (comment);Patient limited by pain   limited by increased headache with sit to Rt sidelying   Behavior During Therapy Anxious           Past Medical History:  Diagnosis Date  . Anxiety   . Back pain    related to spinal stenosis and disc problem, radiates down left buttocks to leg., weakness occ.  [name] Dyspnea   . Headache   . Hyperlipidemia   . Hypertension   . Lumbar post-laminectomy syndrome   . PONV (postoperative nausea and vomiting)   . Vaginal foreign object    "Uses Femring"    Past Surgical History:  Procedure Laterality Date  . ABDOMINAL HYSTERECTOMY    . BIOPSY  12/16/2020   Procedure: BIOPSY;  Surgeon: Ladene Artist, MD;  Location: [facility];  Service: Endoscopy;;  . CARDIAC CATHETERIZATION N/A 04/18/2015   Procedure: Left Heart Cath and Coronary Angiography;  Surgeon: [name], MD;  Location: [facility];  Service: Cardiovascular;  Laterality: N/A;  . COLONOSCOPY W/ BIOPSIES AND POLYPECTOMY    . ESOPHAGOGASTRODUODENOSCOPY (EGD) WITH PROPOFOL N/A 12/16/2020   Procedure: ESOPHAGOGASTRODUODENOSCOPY (EGD) WITH PROPOFOL;  Surgeon: Ladene Artist, MD;  Location: [facility] ENDOSCOPY;  Service: Endoscopy;  Laterality: N/A;  . FOOT SURGERY Bilateral    [place] "bunion,bone spur, tendon" (1) -6'16, (1)-10'16  . IR EPIDUROGRAPHY   07/21/2018  . LUMBAR LAMINECTOMY/DECOMPRESSION MICRODISCECTOMY Bilateral 12/28/2015   Procedure: MICRO LUMBAR DECOMPRESSION L4 - L5 BILATERALLY;  Surgeon: [name], MD;  Location[facility];  Service: Orthopedics;  Laterality: Bilateral;  . LUMBAR LAMINECTOMY/DECOMPRESSION MICRODISCECTOMY Bilateral 03/04/2018   Procedure: Revision of Microlumbar Decompression Bilateral Lumbar Four-Five;  Surgeon: [name], MD;  Location: [facility];  Service: Orthopedics;  Laterality: Bilateral;  90 mins  . SAVORY DILATION N/A 12/16/2020   Procedure: SAVORY DILATION;  Surgeon: Ladene Artist, MD;  Location: [facility] END[facility]COPY;  Service: Endoscopy;  Laterality: N/A;  . SPINAL CORD STIMULATOR INSERTION N/A 09/28/2019   Procedure: THORACIC SPINAL CORD STIMULATOR INSERTION;  Surgeon: Melina Schools, MD;  Location: [facility];  Service: Orthopedics;  Laterality: N/A;  2.5 hrs  . TUBAL LIGATION    . WISDOM TOOTH EXTRACTION    . WOUND EXPLORATION N/A 03/04/2018   Procedure: EXPLORATION OF LUMBAR DECOMPRESSION WOUND;  Surgeon: [name], MD;  Location: [facility];  Service: Orthopedics;  Laterality: N/A;    There were no vitals filed for this visit.   Subjective Assessment - 01/14/21 1118    Subjective Pt reports she fell on Thursday night - states she is having a bad headache and has had a constant headache since the fall; reports no dizziness at this time; recommendation was made to pt to contact her PCP (Dr. [name]) to inform her of the fall with subsequent constant  headache    Pertinent History decompression and laminectomy on 12/28/15 at L4-L5  with redo decompression surgery on May 9,2019, post-op paraparesis with surgery to remove small hematoma; 07/12/18 fall at work (involved rolling chair) with hospital admission, transfer to [facility] with d/c home 08/03/18; HTN, Bil foot surgery     Patient Stated Goals resolve the vertigo    Currently in Pain? Yes    Pain Score 7     Pain Location Head    Pain Orientation Right    Pain  Descriptors / Indicators Headache    Pain Type Acute pain    Pain Onset In the past 7 days    Pain Frequency Constant           NeuroRe-ed: Sit to Rt sidelying position with c/o increased HA - no nystagmus noted  Pt rolled from Rt sidelying to supine to Lt sidelying with significantly increased c/o HA; pt then transferred from Lt sidelying to sitting with min assist  Pt reported HA had been occurring since fall sustained on evening of 01-10-21 in which she hit her head on the wall (syncope?) - pt states she does Not know what happened that caused her to fall - states she lost consciousness for a few seconds and awoke when her husband came to her assist  Pt declined to attempt lying supine or sidelying in remainder of session due to increased HA  Pt reported she felt vertigo had resolved as she had not had any vertigo prior to the fall on 01-10-21; came to today's session to confirm resolution but unable to tolerate testing due to incr. Severity of HA provoked with different positions                            PT Short Term Goals - 01/04/21 1101      PT SHORT TERM GOAL #1   Title Same as LTG's             PT Long Term Goals - 01/14/21 1143      PT LONG TERM GOAL #1   Title Pt will report no dizziness with any bed mobility.    Baseline met 01-14-21    Time 3    Period Weeks    Status Achieved      PT LONG TERM GOAL #2   Title Pt will have a (-) Lt sidelying test with no dizziness and no nystagmus to indicate resolution of Lt BPPV.    Baseline met 01-08-20    Time 3    Period Weeks    Status Achieved      PT LONG TERM GOAL #3   Title Pt will improve FOTO score from 46/100 to >/= 56/100 to demo improvement in vertigo.    Baseline 46/100    Time 3    Period Weeks    Status Achieved      PT LONG TERM GOAL #4   Title Independent in HEP for vestibular/habituation exercises.    Time 3    Period Weeks    Status Achieved                  Plan - 01/14/21 1130    Clinical Impression Statement Pt was unable to tolerate supine and sidelying positions for assessment of vertigo; pt reported she had not had any dizziness since treatment last week but sustained a fall (a syncopal episode?) on Thursday night, falling into wall and hitting  her head.  Pt transferred from sitting to Rt sidelying and c/o significant increased headache; rolled from Rt sidelying to Lt sidelying and reported further increase in headache, requesting to stop session due to severity of headache.  Pt was recommended to follow up with PCP for evaluation of HA due to fall.  Pt will be placed on hold until cleared for aquatic PT by cardiologist.  Pt reports no problems with vertigo had been experienced prior to fall on 01-10-21; pt unable to tolerate positional testing and requests to cancel remaining appts at this time.    Personal Factors and Comorbidities Past/Current Experience;Comorbidity 2;Time since onset of injury/illness/exacerbation    Examination-Activity Limitations Stand;Locomotion Level;Bend;Squat;Stairs;Transfers;Carry;Reach Overhead    Examination-Participation Restrictions Cleaning;Community Activity;Meal Prep;Shop;Laundry    Stability/Clinical Decision Making Stable/Uncomplicated    Rehab Potential Good    PT Frequency 2x / week    PT Duration 3 weeks    PT Treatment/Interventions Canalith Repostioning;ADLs/Self Care Home Management;Neuromuscular re-education;Balance training;Patient/family education    PT Next Visit Plan pt on hold until cleared for aquatic PT    Consulted and Agree with Plan of Care Patient           Patient will benefit from skilled therapeutic intervention in order to improve the following deficits and impairments:  Pain,Postural dysfunction,Difficulty walking,Decreased strength,Decreased activity tolerance,Abnormal gait,Increased muscle spasms,Decreased endurance,Decreased balance,Decreased coordination,Decreased knowledge of use of  DME,Decreased mobility,Decreased safety awareness,Impaired sensation,Impaired tone  Visit Diagnosis: BPPV (benign paroxysmal positional vertigo), left  Dizziness and giddiness     Problem List Patient Active Problem List   Diagnosis Date Noted  . Syncope and collapse 01/01/2021  . Abnormal echocardiogram 01/01/2021  . BPPV (benign paroxysmal positional vertigo), left 12/26/2020  . Odynophagia   . Dysphagia 12/15/2020  . Postural dizziness with presyncope 12/14/2020  . Lumbar post-laminectomy syndrome   . Chronic pain 09/28/2019  . Lumbar spinal stenosis   . Radicular pain   . Gait disorder   . Difficulty in urination 07/13/2018  . Back pain 07/12/2018  . Hyperlipidemia 04/16/2018  . Anxiety disorder, unspecified 04/16/2018  . AKI (acute kidney injury) ([place])   . Benign essential HTN   . Major depression, recurrent ([place])   . Lumbar radiculopathy 03/09/2018  . Paraparesis ([place]) 03/09/2018  . Essential hypertension   . Chronic nonintractable headache   . Leukocytosis   . Acute blood loss anemia   . Postoperative pain   . Generalized weakness   . Spinal stenosis at L4-L5 level 03/04/2018  . Spinal stenosis of lumbar region 12/28/2015  . Chest pain 04/16/2015    Alda Lea, PT 01/15/2021, 4:42 PM  [facility] [address] [place], [name], [address] Phone: [phone]   Fax:  [phone]  Name: [name] MRN: [id number] Date of Birth: [birth year]

## 2021-01-17 ENCOUNTER — Ambulatory Visit: Payer: PPO | Admitting: Physical Therapy

## 2021-01-17 ENCOUNTER — Other Ambulatory Visit (HOSPITAL_COMMUNITY): Payer: Self-pay | Admitting: Physical Medicine and Rehabilitation

## 2021-01-17 MED FILL — BACLOFEN 10 MG TABS: 10 | 30 days supply | Qty: 90 | Fill #0

## 2021-01-18 ENCOUNTER — Other Ambulatory Visit (HOSPITAL_BASED_OUTPATIENT_CLINIC_OR_DEPARTMENT_OTHER): Payer: Self-pay

## 2021-01-21 ENCOUNTER — Telehealth: Payer: Self-pay | Admitting: *Deleted

## 2021-01-21 ENCOUNTER — Ambulatory Visit: Payer: PPO | Admitting: Physical Therapy

## 2021-01-21 NOTE — Telephone Encounter (Signed)
Patient has completed the term of her monitor.  She can go ahead and ship it back to [employer] in the box with the prepaid [employer] shipping label on it.

## 2021-01-25 ENCOUNTER — Telehealth: Payer: Self-pay | Admitting: Internal Medicine

## 2021-01-25 NOTE — Telephone Encounter (Signed)
Patient called w/monitor results   Study Highlights  Normal monitor report.  Pixie Casino, MD, [facility], [place] Director of the Advanced Lipid Disorders &  Cardiovascular Risk Reduction Clinic Diplomate of the American Board of Clinical Lipidology Attending Cardiologist  Direct Dial: [phone]  Fax: [phone]  Website:  [email or web address]

## 2021-01-29 ENCOUNTER — Ambulatory Visit (HOSPITAL_COMMUNITY): Payer: PPO | Attending: Cardiology

## 2021-01-29 ENCOUNTER — Other Ambulatory Visit: Payer: Self-pay

## 2021-01-29 DIAGNOSIS — R079 Chest pain, unspecified: Secondary | ICD-10-CM | POA: Diagnosis not present

## 2021-01-29 DIAGNOSIS — R55 Syncope and collapse: Secondary | ICD-10-CM | POA: Diagnosis not present

## 2021-01-29 DIAGNOSIS — E785 Hyperlipidemia, unspecified: Secondary | ICD-10-CM | POA: Insufficient documentation

## 2021-01-29 DIAGNOSIS — I10 Essential (primary) hypertension: Secondary | ICD-10-CM | POA: Diagnosis not present

## 2021-01-29 DIAGNOSIS — R0789 Other chest pain: Secondary | ICD-10-CM | POA: Diagnosis not present

## 2021-01-29 DIAGNOSIS — R0600 Dyspnea, unspecified: Secondary | ICD-10-CM | POA: Insufficient documentation

## 2021-01-29 DIAGNOSIS — Z8249 Family history of ischemic heart disease and other diseases of the circulatory system: Secondary | ICD-10-CM | POA: Diagnosis not present

## 2021-01-29 DIAGNOSIS — R931 Abnormal findings on diagnostic imaging of heart and coronary circulation: Secondary | ICD-10-CM | POA: Insufficient documentation

## 2021-01-29 DIAGNOSIS — R42 Dizziness and giddiness: Secondary | ICD-10-CM | POA: Diagnosis not present

## 2021-01-29 LAB — ECHOCARDIOGRAM COMPLETE
Area-P 1/2: 4.76 cm2
S' Lateral: 2.8 cm

## 2021-02-04 ENCOUNTER — Ambulatory Visit: Payer: PPO | Admitting: General Practice

## 2021-02-07 ENCOUNTER — Other Ambulatory Visit (HOSPITAL_COMMUNITY): Payer: Self-pay

## 2021-02-07 ENCOUNTER — Ambulatory Visit: Payer: PPO | Admitting: Internal Medicine

## 2021-02-07 ENCOUNTER — Other Ambulatory Visit: Payer: Self-pay

## 2021-02-07 ENCOUNTER — Encounter: Payer: Self-pay | Admitting: Internal Medicine

## 2021-02-07 VITALS — BP 174/102 | HR 88 | Ht 65.0 in | Wt 179.0 lb

## 2021-02-07 DIAGNOSIS — R55 Syncope and collapse: Secondary | ICD-10-CM | POA: Diagnosis not present

## 2021-02-07 DIAGNOSIS — I10 Essential (primary) hypertension: Secondary | ICD-10-CM

## 2021-02-07 DIAGNOSIS — I422 Other hypertrophic cardiomyopathy: Secondary | ICD-10-CM

## 2021-02-07 MED ORDER — CARVEDILOL 25 MG PO TABS
25.0000 mg | ORAL_TABLET | Freq: Two times a day (BID) | ORAL | 3 refills | Status: DC
  Filled 2021-02-07: qty 180, 90d supply, fill #0

## 2021-02-07 NOTE — Patient Instructions (Signed)
Medication Instructions:  Increase carvedilol (coreg) to 25mg  (1 tablet) twice daily.  *If you need a refill on your cardiac medications before your next appointment, please call your pharmacy*  Follow-Up: At [facility], you and your health needs are our priority.  As part of our continuing mission to provide you with exceptional heart care, we have created designated Provider Care Teams.  These Care Teams include your primary Cardiologist (physician) and Advanced Practice Providers (APPs -  Physician Assistants and Nurse Practitioners) who all work together to provide you with the care you need, when you need it.  We recommend signing up for the patient portal called "MyChart".  Sign up information is provided on this After Visit Summary.  MyChart is used to connect with patients for Virtual Visits (Telemedicine).  Patients are able to view lab/test results, encounter notes, upcoming appointments, etc.  Non-urgent messages can be sent to your provider as well.   To learn more about what you can do with MyChart, go to [email or web address].    Your next appointment:   6 month(s)  The format for your next appointment:   In Person  Provider:   You may see Pixie Casino, MD or one of the following Advanced Practice Providers on your designated Care Team:    Almyra Deforest, PA-C  Fabian Sharp, PA-C or   Roby Lofts, [place]    Other Instructions

## 2021-02-07 NOTE — Progress Notes (Signed)
OFFICE NOTE  Chief Complaint:  Follow-up echo, monitor  Primary Care Physician: [name], [name], MD  HPI:  [name] is a 60 y.o. female with a past medial history significant for hypertension, dyslipidemia, lumbar back pain, anxiety and echo evidence of possible hypertrophic cardiomyopathy.  I had seen her in the past in the hospital but basically she has been followed by our APP's in the office until recently.  She had an echo in February which showed dynamic LVEF 65 to 70% with severe asymmetric left ventricular hypertrophy and a septal dimension of 1.9 cm.  I had recommended a cardiac MRI however this was not performed due to the fact that she has spinal stimulator.  Subsequently she had another echo 2 months later that I did not order but was interpreted as having normal septal thickness.  I personally reviewed the echo and disagree with that finding.  The septum is completely mis-measured.  It actually does measure about 2 cm.  This is consistent with prior findings.  There is family history concerning for hypertrophic cardiomyopathy.  We discussed that in more detail today.  She does have uncontrolled hypertension as well and blood pressure was quite high today 174/102.  She seems to be tolerating carvedilol which was started in the hospital and likely would benefit from more.  With regards to her spinal stimulator, her orthopedic spine surgeon recommended that she could either have the leads replaced with MRI compatible or perhaps have it removed.  She says it actually does not seem to have helped her very much and she is considering having it removed.  PMHx:  Past Medical History:  Diagnosis Date  . Anxiety   . Back pain    related to spinal stenosis and disc problem, radiates down left buttocks to leg., weakness occ.  [name] Dyspnea   . Headache   . Hyperlipidemia   . Hypertension   . Lumbar post-laminectomy syndrome   . PONV (postoperative nausea and vomiting)   .  Vaginal foreign object    "Uses Femring"    Past Surgical History:  Procedure Laterality Date  . ABDOMINAL HYSTERECTOMY    . BIOPSY  12/16/2020   Procedure: BIOPSY;  Surgeon: Ladene Artist, MD;  Location: [place];  Service: Endoscopy;;  . CARDIAC CATHETERIZATION N/A 04/18/2015   Procedure: Left Heart Cath and Coronary Angiography;  Surgeon: [name], MD;  Location: [place] [facility];  Service: Cardiovascular;  Laterality: N/A;  . COLONOSCOPY W/ BIOPSIES AND POLYPECTOMY    . ESOPHAGOGASTRODUODENOSCOPY (EGD) WITH PROPOFOL N/A 12/16/2020   Procedure: ESOPHAGOGASTRODUODENOSCOPY (EGD) WITH PROPOFOL;  Surgeon: Ladene Artist, MD;  Location: [facility] ENDOSCOPY;  Service: Endoscopy;  Laterality: N/A;  . FOOT SURGERY Bilateral    [place] "bunion,bone spur, tendon" (1) -6'16, (1)-10'16  . IR EPIDUROGRAPHY  07/21/2018  . LUMBAR LAMINECTOMY/DECOMPRESSION MICRODISCECTOMY Bilateral 12/28/2015   Procedure: MICRO LUMBAR DECOMPRESSION L4 - L5 BILATERALLY;  Surgeon: [name], MD;  Location: [facility];  Service: Orthopedics;  Laterality: Bilateral;  . LUMBAR LAMINECTOMY/DECOMPRESSION MICRODISCECTOMY Bilateral 03/04/2018   Procedure: Revision of Microlumbar Decompression Bilateral Lumbar Four-Five;  Surgeon: [name], MD;  Location: [place];  Service: Orthopedics;  Laterality: Bilateral;  90 mins  . SAVORY DILATION N/A 12/16/2020   Procedure: SAVORY DILATION;  Surgeon: Ladene Artist, MD;  Location: [facility] ENDOSCOPY;  Service: Endoscopy;  Laterality: N/A;  . SPINAL CORD STIMULATOR INSERTION N/A 09/28/2019   Procedure: THORACIC SPINAL CORD STIMULATOR INSERTION;  Surgeon: Melina Schools,  MD;  Location: [place];  Service: Orthopedics;  Laterality: N/A;  2.5 hrs  . TUBAL LIGATION    . WISDOM TOOTH EXTRACTION    . WOUND EXPLORATION N/A 03/04/2018   Procedure: EXPLORATION OF LUMBAR DECOMPRESSION WOUND;  Surgeon: [name], MD;  Location: [place];  Service: Orthopedics;  Laterality: N/A;    FAMHx:   Family History  Problem Relation Age of Onset  . Heart attack Mother   . Lung cancer Father   . Cancer Father   . Pancreatic cancer Sister   . Breast cancer Sister 13  . Throat cancer Brother   . Multiple myeloma Sister   . Breast cancer Sister        diagnosed in her 53's  . Heart attack Sister   . Stomach cancer Cousin   . Colon cancer Neg Hx     SOCHx:   reports that she has never smoked. She has never used smokeless tobacco. She reports that she does not drink alcohol and does not use drugs.  ALLERGIES:  Allergies  Allergen Reactions  . Cephalosporins Anaphylaxis  . Penicillins Anaphylaxis and Hives    Did it involve swelling of the face/tongue/throat, SOB, or low BP? Yes Did it involve sudden or severe rash/hives, skin peeling, or any reaction on the inside of your mouth or nose? Yes Did you need to seek medical attention at a hospital or doctor's office? Yes When did it last happen?10+ years If all above answers are "NO", may proceed with cephalosporin use.    . Anesthetics, Amide Nausea And Vomiting    Does not know name of it. States they put it on record foot center.   . Betadine [Povidone Iodine] Other (See Comments)    "Skin Burn" caused scar on Left buttock  . Peach [Prunus Persica] Hives  . Claritin [Loratadine]     Rash, pruritis   . Latex Hives, Itching and Rash    ROS: Pertinent items noted in HPI and remainder of comprehensive ROS otherwise negative.  HOME MEDS: Current Outpatient Medications on File Prior to Visit  Medication Sig Dispense Refill  . ALPRAZolam (XANAX) 0.25 MG tablet TAKE 1 TABLET BY MOUTH ONCE DAILY AS NEEDED FOR ANXIETY OR SLEEP (Patient taking differently: Take 0.25 mg by mouth daily as needed for anxiety or sleep.) 30 tablet 0  . atorvastatin (LIPITOR) 20 MG tablet TAKE 1 TABLET BY MOUTH ONCE DAILY 90 tablet 3  . baclofen (LIORESAL) 10 MG tablet TAKE 1 TABLET BY MOUTH THREE TIMES DAILY AS NEEDED 90 tablet 3  .  cholecalciferol (VITAMIN D3) 25 MCG (1000 UNIT) tablet Take 1,000 Units by mouth daily.    . diphenhydrAMINE (BENADRYL) 25 MG tablet Take 25 mg by mouth daily as needed for allergies.    . DULoxetine (CYMBALTA) 30 MG capsule TAKE 1 CAPSULE BY MOUTH DAILY 30 capsule 2  . FEMRING 0.05 MG/24HR RING SMARTSIG:1 Ring Vaginal Every 3 Months    . gabapentin (NEURONTIN) 400 MG capsule TAKE 1 CAPSULE BY MOUTH 3 TIMES DAILY 90 capsule 3  . meclizine (ANTIVERT) 25 MG tablet TAKE 1 TABLET BY MOUTH 2 TIMES DAILY AS NEEDED FOR DIZZINESS 20 tablet 0  . methocarbamol (ROBAXIN) 500 MG tablet TAKE 1 TABLET BY MOUTH AT BEDTIME AS NEEDED 90 tablet 0  . traMADol (ULTRAM) 50 MG tablet TAKE 1 TABLET BY MOUTH 3 TIMES DAILY AS NEEDED FOR PAIN 90 tablet 0   No current facility-administered medications on file prior to visit.  LABS/IMAGING: No results found for this or any previous visit (from the past 48 hour(s)). No results found.  LIPID PANEL:    Component Value Date/Time   CHOL 198 04/20/2020 0955   TRIG 62.0 04/20/2020 0955   HDL 61.70 04/20/2020 0955   CHOLHDL 3 04/20/2020 0955   VLDL 12.4 04/20/2020 0955   LDLCALC 123 (H) 04/20/2020 0955     WEIGHTS: Wt Readings from Last 3 Encounters:  02/07/21 179 lb (81.2 kg)  01/01/21 179 lb (81.2 kg)  12/26/20 168 lb (76.2 kg)    VITALS: BP (!) 174/102   Pulse 88   Ht _0  (1.651 m)   Wt 179 lb (81.2 kg)   BMI 29.79 kg/m   EXAM: General appearance: alert and no distress Neck: no carotid bruit, no JVD and thyroid not enlarged, symmetric, no tenderness/mass/nodules Lungs: clear to auscultation bilaterally Heart: regular rate and rhythm, S1, S2 normal, no murmur, click, rub or gallop Abdomen: soft, non-tender; bowel sounds normal; no masses,  no organomegaly Extremities: extremities normal, atraumatic, no cyanosis or edema Pulses: 2+ and symmetric Skin: Skin color, texture, turgor normal. No rashes or lesions Neurologic: Grossly normal Psych:  Pleasant  Examination was chaperoned by Sheral Apley, RN.   EKG: Normal sinus rhythm at 86, possible left atrial margin- personally reviewed  ASSESSMENT: 1. Echocardiographic features of hypertrophic cardiomyopathy, IVSD 1.9 cm (11/2020) 2. No evidence for LVOT obstruction 3. Uncontrolled hypertension  PLAN: 1.   Ms. Hathaway has uncontrolled hypertension and some echocardiographic features of hypertrophic cardiomyopathy.  Ultimately cardiac MRI would be a more definitive diagnosis for this.  Because of her spinal stimulator we cannot proceed at this point however she is considering having it removed.  If that were the case I would recommend cardiac MRI.  For now would recommend further heart rate and blood pressure lowering which should help prevent any obstructive symptoms.  Increase her carvedilol to 25 mg twice daily.  Plan follow-up with me in about 6 months  Pixie Casino, MD, [facility], [place] Director of the [facility]  Card[facility] Diplomate of the American Board of Clinical Lipidology Attending Cardiologist  Direct Dial: [phone]  Fax: [phone]  Website:  [email or web address] 02/07/2021, 1:06 PM

## 2021-02-08 ENCOUNTER — Other Ambulatory Visit (HOSPITAL_COMMUNITY): Payer: Self-pay

## 2021-02-08 ENCOUNTER — Other Ambulatory Visit: Payer: Self-pay

## 2021-02-08 MED ORDER — GABAPENTIN 400 MG PO CAPS
ORAL_CAPSULE | ORAL | 3 refills | Status: DC
  Filled 2021-02-08: qty 90, 30d supply, fill #0
  Filled 2021-03-27: qty 90, 30d supply, fill #1
  Filled 2021-05-15: qty 90, 30d supply, fill #2
  Filled 2021-07-15: qty 90, 30d supply, fill #3

## 2021-02-11 ENCOUNTER — Telehealth: Payer: Self-pay

## 2021-02-11 ENCOUNTER — Telehealth: Payer: Self-pay | Admitting: Internal Medicine

## 2021-02-11 NOTE — Telephone Encounter (Signed)
[name], called and stated you had advised her to continue water aerobics , how ever she wasn't able to do so because she had the hot monitor, she states she doesn't have it anymore but needs to be cleared by you to go back and wants you to clear her so she can resume water aerobics.

## 2021-02-11 NOTE — Telephone Encounter (Signed)
I think she had a "heart" monitor! :) yes she can do water aerobics if the monitor is off.  thx

## 2021-02-11 NOTE — Telephone Encounter (Signed)
Spoke with pt on the phone regarding shortness of breath and chest tightness. Pt states that over time her shortness of breath and chest tightness have progressively gotten worst. Pt states that most of the time the SOB and tightness only occur when she is laying down however, the SOB can come on when she is walking through her house.  Pt's blood pressure earlier today was 169/102 before she had taken any of her blood pressure medication. Pt takes blood pressure while on the phone with results 159/100. Pt states that she is taking all of her medications as prescribed and has not changes in diet. Pt does feel like she drinks a good amount of water throughout the day. Pt does not complain of any swelling. Pt is becoming increasingly concerned because she is uneasy about the feelings that she is experiencing. Will send to MD to advise.

## 2021-02-11 NOTE — Telephone Encounter (Signed)
Left VM with the information.

## 2021-02-11 NOTE — Telephone Encounter (Incomplete)
Pt c/o of Chest Pain: STAT if CP now or developed within 24 hours  1. Are you having CP right now? yes  2. Are you experiencing any other symptoms (ex. SOB, nausea, vomiting, sweating)?SOB  3. How long have you been experiencing CP? Last night  4. Is your CP continuous or coming and going? continuous  5. Have you taken Nitroglycerin? unknown ? Pt c/o Shortness Of Breath: STAT if SOB developed within the last 24 hours or pt is noticeably SOB on the phone  1. Are you currently SOB (can you hear that pt is SOB on the phone)? No not as bad  2. How long have you been experiencing SOB?   3. Are you SOB when sitting or when up moving around? When she is trying to lay down   4. Are you currently experiencing any other symptoms? PT is calling with concerns she is experiencing tightness in the chest.

## 2021-02-14 NOTE — Telephone Encounter (Signed)
She should be directed to ER or urgent care - cannot do much additionally in the office.  Thanks.  Dr. Lemmie Evens

## 2021-02-18 ENCOUNTER — Other Ambulatory Visit: Payer: Self-pay

## 2021-02-18 ENCOUNTER — Encounter: Payer: Self-pay | Admitting: Family Medicine

## 2021-02-18 ENCOUNTER — Ambulatory Visit (INDEPENDENT_AMBULATORY_CARE_PROVIDER_SITE_OTHER): Payer: PPO | Admitting: Family Medicine

## 2021-02-18 VITALS — BP 150/90 | HR 90 | Temp 98.9°F | Resp 16 | Ht 65.0 in

## 2021-02-18 DIAGNOSIS — Y92009 Unspecified place in unspecified non-institutional (private) residence as the place of occurrence of the external cause: Secondary | ICD-10-CM | POA: Diagnosis not present

## 2021-02-18 DIAGNOSIS — W19XXXA Unspecified fall, initial encounter: Secondary | ICD-10-CM

## 2021-02-18 DIAGNOSIS — R519 Headache, unspecified: Secondary | ICD-10-CM | POA: Diagnosis not present

## 2021-02-18 DIAGNOSIS — F411 Generalized anxiety disorder: Secondary | ICD-10-CM

## 2021-02-18 DIAGNOSIS — R0602 Shortness of breath: Secondary | ICD-10-CM

## 2021-02-18 DIAGNOSIS — M5412 Radiculopathy, cervical region: Secondary | ICD-10-CM | POA: Diagnosis not present

## 2021-02-18 DIAGNOSIS — I10 Essential (primary) hypertension: Secondary | ICD-10-CM

## 2021-02-18 DIAGNOSIS — R0789 Other chest pain: Secondary | ICD-10-CM

## 2021-02-18 NOTE — Patient Instructions (Addendum)
A few things to remember from today's visit:  Essential hypertension  SOB (shortness of breath)  Chest tightness  If you need refills please call your pharmacy. Do not use My Chart to request refills or for acute issues that need immediate attention.   Because carvedilol was recently increased, no changes today. Continue monitoring blood pressure and let me know about readings in 3-4 weeks. I will send a message to Dr Rolena Infante.  Please be sure medication list is accurate. If a new problem present, please set up appointment sooner than planned today.

## 2021-02-18 NOTE — Assessment & Plan Note (Addendum)
She does not think this problem is contributing or causing some of her symptoms (chest tightness/SOB) but all these recent health issues are aggravating anxiety. For now continue Cymbalta 30 mg daily and alprazolam 0.25 mg daily as needed.

## 2021-02-18 NOTE — Telephone Encounter (Signed)
Spoke with patient who saw her PCP today. Her BP was elevated at her visit today. No med changes were made as carvedilol dose was increased at last visit. She gets out of breath with movement/climbing stairs.   Her home BP cuff is not working.  Her PCP advised to monitor home BP/pulse readings She plans to get a new BP cuff Advised arm cuff over wrist cuff - Omron brand suggested  Patient reports continued tightness in center part of chest. She is very concerned about this. Her mother passed away from heart attack.   Patient states she would have her spinal stimulator removed in effort to have cardiac MRI - PCP sent a message to Dr. Rolena Infante. She would like to have this done so she will let us know if she hears back from ortho.  From MD note: 1.   [name] has uncontrolled hypertension and some echocardiographic features of hypertrophic cardiomyopathy. Ultimately cardiac MRI would be a more definitive diagnosis for this.  Because of her spinal stimulator we cannot proceed at this point however she is considering having it removed.  If that were the case I would recommend cardiac MRI.

## 2021-02-18 NOTE — Progress Notes (Signed)
HPI:  [name] is a 60 y.o. female, who is here today for follow up.   She was last seen on 12/28/20. Dizziness and syncopal episode on 12/14/20. Echo 12/15/20: Severe asymmetric left ventricular hypertrophy of the basal-septal segment. The IVSd is 1.9 cm - no LVOT obstruction is noted at rest. Valsalva not performed. Findings suggest possible hypertrophic  cardiomyopathy.Grade I diastolic dysfunction.  Since her last visit she has seen cardiologist. Carvedilol increased from 12.5 mg to 25 mg bid. BP's at home still "little" elevated, last week 160/90-100's  She is still having chest tightness mid intermittently, not always with exertion. Going up stairs exacerbates chest discomfort and SOB. SOB also happens when lying down. Chest discomfort is not radiated. Negative for associated palpitations or diaphoresis. "little" nausea last week, no heartburn.  Heart MRI was recommended but cannot be done until spinal stimulator of moved or removed. States that she called her neurosurgeon's office asking to have device removed but has not receive a message back. She would like for me to contact provider.  Non productive cough since hospitalization, stable, no associated wheezing. CXR 12/14/20: No acute abnormalities.  Headache "awful", not new but getting worse. Tylenol and lying help. Parietal headache , cannot explain pain. Negative for associated nausea, new focal neuro deficit, or MS changes. Hx of tension headache but states that this is different to her usual headache. Headache is not daily, she is not having it today, not sure about frequency. She has not identified exacerbating or alleviating factors.  Head and neck CT on 12/14/20: Minimal aortic atherosclerosis. Minimal atherosclerotic change at both carotid bifurcations but no stenosis. No intracranial large or medium vessel occlusion or correctable proximal stenosis. Aortic Atherosclerosis  (ICD10-I70.0).  C/O cervical and upper pain with LUE numbness. Cervical MRI in 04/2019 done because bilateral UE numbness. Multilevel cervical spondylosis, most pronounced at the C5-6 level where there is moderate canal stenosis and mild bilateral foraminal stenosis. No significant interval progression when compared with prior MRI 03/08/2018  She is on Gabapentin 400 mg tid.  Reporting a fall later last month. States that her head hit the wall, she is not sure if she had LOC. She was coming out of her bathroom, she does not remember how she fell. She called her husband, who found her on the floor and helped her get up. She was at her baseline and oriented. She did not seek medical attention. States that Dr Rolena Infante did not thinks fall was caused by LE weakness . She has not had another syncopal episode, has had episodes of lightheadedness. Today she is driving.  All her recent health problems have exacerbated anxiety, she does not think anxiety is causing any of these. She is on Duloxetine 30 mg daily, which is helping with upper and lower back pain. Alprazolam 0.25 mg daily prn.  Review of Systems  Constitutional: Positive for activity change and fatigue. Negative for appetite change and fever.  HENT: Negative for mouth sores, nosebleeds and sore throat.   Gastrointestinal: Negative for abdominal pain and vomiting.       Negative for changes in bowel habits.  Genitourinary: Negative for decreased urine volume, dysuria and hematuria.  Musculoskeletal: Positive for back pain, gait problem and neck pain.  Skin: Negative for pallor and rash.  Neurological: Negative for syncope and facial asymmetry.  Psychiatric/Behavioral: Positive for sleep disturbance. Negative for confusion. The patient is nervous/anxious.   Rest of ROS, see pertinent positives sand negatives in HPI  Current  Outpatient Medications on File Prior to Visit  Medication Sig Dispense Refill  . ALPRAZolam (XANAX) 0.25 MG  tablet TAKE 1 TABLET BY MOUTH ONCE DAILY AS NEEDED FOR ANXIETY OR SLEEP (Patient taking differently: Take 0.25 mg by mouth daily as needed for anxiety or sleep.) 30 tablet 0  . atorvastatin (LIPITOR) 20 MG tablet TAKE 1 TABLET BY MOUTH ONCE DAILY 90 tablet 3  . baclofen (LIORESAL) 10 MG tablet TAKE 1 TABLET BY MOUTH THREE TIMES DAILY AS NEEDED 90 tablet 3  . carvedilol (COREG) 25 MG tablet Take 1 tablet (25 mg total) by mouth 2 (two) times daily with a meal. 180 tablet 3  . cholecalciferol (VITAMIN D3) 25 MCG (1000 UNIT) tablet Take 1,000 Units by mouth daily.    . diphenhydrAMINE (BENADRYL) 25 MG tablet Take 25 mg by mouth daily as needed for allergies.    . DULoxetine (CYMBALTA) 30 MG capsule TAKE 1 CAPSULE BY MOUTH DAILY 30 capsule 2  . FEMRING 0.05 MG/24HR RING SMARTSIG:1 Ring Vaginal Every 3 Months    . gabapentin (NEURONTIN) 400 MG capsule TAKE 1 CAPSULE BY MOUTH 3 TIMES DAILY 90 capsule 3  . gabapentin (NEURONTIN) 400 MG capsule TAKE 1 CAPSULE BY MOUTH 3 TIMES DAILY 90 capsule 3  . meclizine (ANTIVERT) 25 MG tablet TAKE 1 TABLET BY MOUTH 2 TIMES DAILY AS NEEDED FOR DIZZINESS 20 tablet 0  . methocarbamol (ROBAXIN) 500 MG tablet TAKE 1 TABLET BY MOUTH AT BEDTIME AS NEEDED 90 tablet 0  . traMADol (ULTRAM) 50 MG tablet TAKE 1 TABLET BY MOUTH 3 TIMES DAILY AS NEEDED FOR PAIN 90 tablet 0   No current facility-administered medications on file prior to visit.   Past Medical History:  Diagnosis Date  . Anxiety   . Back pain    related to spinal stenosis and disc problem, radiates down left buttocks to leg., weakness occ.  [name] Dyspnea   . Headache   . Hyperlipidemia   . Hypertension   . Lumbar post-laminectomy syndrome   . PONV (postoperative nausea and vomiting)   . Vaginal foreign object    "Uses Femring"   Allergies  Allergen Reactions  . Cephalosporins Anaphylaxis  . Penicillins Anaphylaxis and Hives    Did it involve swelling of the face/tongue/throat, SOB, or low BP? Yes Did it  involve sudden or severe rash/hives, skin peeling, or any reaction on the inside of your mouth or nose? Yes Did you need to seek medical attention at a hospital or doctor's office? Yes When did it last happen?10+ years If all above answers are "NO", may proceed with cephalosporin use.    . Anesthetics, Amide Nausea And Vomiting    Does not know name of it. States they put it on record foot center.   . Betadine [Povidone Iodine] Other (See Comments)    "Skin Burn" caused scar on Left buttock  . Peach [Prunus Persica] Hives  . Claritin [Loratadine]     Rash, pruritis   . Latex Hives, Itching and Rash    Social History   Socioeconomic History  . Marital status: Married    Spouse name: Not on file  . Number of children: Not on file  . Years of education: Not on file  . Highest education level: Not on file  Occupational History  . Not on file  Tobacco Use  . Smoking status: Never Smoker  . Smokeless tobacco: Never Used  Vaping Use  . Vaping Use: Never used  Substance and Sexual Activity  .  Alcohol use: No  . Drug use: No  . Sexual activity: Yes    Partners: Male    Birth control/protection: Post-menopausal  Other Topics Concern  . Not on file  Social History Narrative  . Not on file   Social Determinants of Health   Financial Resource Strain: Not on file  Food Insecurity: Not on file  Transportation Needs: Not on file  Physical Activity: Not on file  Stress: Not on file  Social Connections: Not on file    Vitals:   02/18/21 1015  BP: (!) 150/90  Pulse: 90  Resp: 16  Temp: 98.9 F (37.2 C)  SpO2: 97%   Body mass index is 29.79 kg/m.  Physical Exam Vitals and nursing note reviewed.  Constitutional:      General: She is not in acute distress.    Appearance: She is well-developed.  HENT:     Head: Normocephalic and atraumatic.     Mouth/Throat:     Mouth: Mucous membranes are moist.     Pharynx: Oropharynx is clear.  Eyes:      Conjunctiva/sclera: Conjunctivae normal.     Pupils: Pupils are equal, round, and reactive to light.  Cardiovascular:     Rate and Rhythm: Normal rate and regular rhythm.     Pulses:          Dorsalis pedis pulses are 2+ on the right side and 2+ on the left side.     Heart sounds: No murmur heard.     Comments: Trace pitting LE edema, bilateral. Pulmonary:     Effort: Pulmonary effort is normal. No respiratory distress.     Breath sounds: Normal breath sounds.  Abdominal:     Palpations: Abdomen is soft.     Tenderness: There is no abdominal tenderness.  Musculoskeletal:     Cervical back: No tenderness or bony tenderness. Decreased range of motion.     Thoracic back: Tenderness present. No bony tenderness.       Back:     Right lower leg: Pitting Edema present.     Left lower leg: Pitting Edema present.     Comments: Shoulder ROM is adequate,bilateral. ROM elicits upper back pain. RLE/foot brace.  Lymphadenopathy:     Cervical: No cervical adenopathy.  Skin:    General: Skin is warm.     Findings: No erythema or rash.  Neurological:     Mental Status: She is alert and oriented to person, place, and time.     Cranial Nerves: No cranial nerve deficit.     Comments: Unstable gait, assisted by a walker.  Psychiatric:        Mood and Affect: Mood is anxious.     Comments: Well groomed, good eye contact.   ASSESSMENT AND PLAN:  [name] was seen today for follow-up.  Diagnoses and all orders for this visit:  SOB (shortness of breath) Stable. Also happens at rest  No hx of tobacco use. We discussed possible causes. Cardiac cath in 03/2015 negative for significant CAD. Continue daily activities as tolerated. Instructed about warning signs.  Chest tightness We discussed possible etiologies. Instructed about warning signs. Continue following with cardiologist.  I will send a message to Dr Rolena Infante and ask what would be the next step to have spinal  stimulator removed in order for her to have cardia MRI done.  Headache, unspecified headache type Hx and examination today do not suggest a serious process. Still offered head imaging, she agrees with  holding on further work up for now. Clearly instructed about warning signs.  Cervical radiculopathy LUE numbness and upper back pain most likely caused by this problem. Monitor for new symptoms. Continue following with ortho.  Fall in home, initial encounter Several risk factors. Fall precautions discussed.  Essential hypertension BP is still mildly elevated. Because carvedilol was recently increased, recommend continue 25 mg twice daily and we will hold on adding another agent. Continue monitoring BP regularly. Instructed to let me know about BP number in 3 to 4 weeks. Continue low-salt diet.  GAD (generalized anxiety disorder) She does not think this problem is contributing or causing some of her symptoms (chest tightness/SOB) but all these recent health issues are aggravating anxiety. For now continue Cymbalta 30 mg daily and alprazolam 0.25 mg daily as needed.   Spent 42 minutes.  During this time history was obtained and documented, examination was performed, prior labs/imaging reviewed, and assessment/plan discussed.   Return in about 4 months (around 06/20/2021).  Izaan Kingbird G. [name], MD  [facility]. [place] office.   A few things to remember from today's visit:  Essential hypertension  SOB (shortness of breath)  Chest tightness  If you need refills please call your pharmacy. Do not use My Chart to request refills or for acute issues that need immediate attention.   Because carvedilol was recently increased, no changes today. Continue monitoring blood pressure and let me know about readings in 3-4 weeks. I will send a message to Dr Rolena Infante.  Please be sure medication list is accurate. If a new problem present, please set up appointment sooner than  planned today.

## 2021-02-18 NOTE — Assessment & Plan Note (Signed)
BP is still mildly elevated. Because carvedilol was recently increased, recommend continue 25 mg twice daily and we will hold on adding another agent. Continue monitoring BP regularly. Instructed to let me know about BP number in 3 to 4 weeks. Continue low-salt diet.

## 2021-02-19 NOTE — Telephone Encounter (Signed)
Ok, would proceed with cMRI once her spinal stimulator is removed.  Dr Lemmie Evens

## 2021-02-21 ENCOUNTER — Other Ambulatory Visit (HOSPITAL_COMMUNITY): Payer: Self-pay

## 2021-02-21 ENCOUNTER — Other Ambulatory Visit: Payer: Self-pay | Admitting: Obstetrics

## 2021-02-21 ENCOUNTER — Telehealth: Payer: Self-pay | Admitting: Internal Medicine

## 2021-02-21 MED ORDER — FEMRING 0.05 MG/24HR VA RING
VAGINAL_RING | VAGINAL | 2 refills | Status: DC
  Filled 2021-02-21: qty 1, 84d supply, fill #0
  Filled 2021-02-21 – 2021-02-22 (×3): qty 1, 90d supply, fill #0
  Filled 2021-06-10: qty 1, 90d supply, fill #1
  Filled 2021-09-11: qty 1, 90d supply, fill #2

## 2021-02-21 MED FILL — Atorvastatin Calcium Tab 20 MG (Base Equivalent): ORAL | 90 days supply | Qty: 90 | Fill #0 | Status: AC

## 2021-02-21 NOTE — Telephone Encounter (Signed)
Patient called to see if there was another way to get a image of her chest because she still got a tightness and she feels like its getting worse. On the last visit she stated that Dr. Debara Pickett discuss may going to another dr to have the stint remove from her back. Patient states that nothing hasnt gotten any better from the last visit. if the MRI is needed she will to the dr to have it remove.Please advise.

## 2021-02-21 NOTE — Telephone Encounter (Signed)
Thanks .[name] I'm not sure that the cMRI will answer all of her questions. Maybe something else is causing her chest tightness  Dr Lemmie Evens

## 2021-02-21 NOTE — Telephone Encounter (Signed)
Spoke with patient of Dr. Debara Pickett   She reports she talked with Dr. Rolena Infante' office and this doctor does not want to remove spinal stimulator but she plans to have this done with another provider at [facility](?). She said she may get a call about this next week  She reports worsening shortness of breath - especially noted when going up steps  Patient is tearful on the phone, very concerned about her heart and frustrated that she cannot have testing until the stimulator is removed. Reassured her that there is a plan for testing in place. Provided ED precautions. She voiced understanding.   Will updated MD as Juluis Rainier

## 2021-02-22 ENCOUNTER — Other Ambulatory Visit (HOSPITAL_COMMUNITY): Payer: Self-pay

## 2021-02-25 ENCOUNTER — Other Ambulatory Visit (HOSPITAL_COMMUNITY): Payer: Self-pay

## 2021-02-26 ENCOUNTER — Telehealth: Payer: Self-pay | Admitting: Physical Therapy

## 2021-02-26 NOTE — Telephone Encounter (Signed)
Dr. Debara Pickett, Lael Wetherbee is requesting to resume aquatic therapy - we will need new orders since she was hospitalized on 12-17-20.  If you feel she is safe to participate in aquatic therapy at this time would you please place an order in Epic to resume and we will get her scheduled. It appears from her chart that further testing is to be done- should we hold off on the aquatic therapy until that is completed?  Please advise-  Thank you,  Guido Sander, [place] [phone]

## 2021-03-13 ENCOUNTER — Other Ambulatory Visit: Payer: Self-pay | Admitting: Obstetrics

## 2021-03-13 DIAGNOSIS — Z1231 Encounter for screening mammogram for malignant neoplasm of breast: Secondary | ICD-10-CM

## 2021-03-18 ENCOUNTER — Telehealth: Payer: Self-pay | Admitting: Internal Medicine

## 2021-03-18 NOTE — Telephone Encounter (Signed)
Pt c/o Shortness Of Breath: STAT if SOB developed within the last 24 hours or pt is noticeably SOB on the phone  1. Are you currently SOB (can you hear that pt is SOB on the phone)? Not at this time  2. How long have you been experiencing SOB? About 2 months, but it is getting worse  3. Are you SOB when sitting or when up moving around? When she goes up steps and move around  4. Are you currently experiencing any other symptoms? hving some chest pains now

## 2021-03-18 NOTE — Telephone Encounter (Signed)
Spoke with patient - scheduled visit for 6/1 @ 10:45am with Dr. Debara Pickett

## 2021-03-18 NOTE — Telephone Encounter (Signed)
Spoke with patient of Dr. Debara Pickett who reports chest pain and shortness of breath. Patient reports symptoms were really bad last night, almost called 911. She elevated the head of her bed, drank some water, tried to stay calm. She does not feel well this morning. She reports residual chest pain. She had shortness of breath, worse last night. She said her labored breathing "scared her husband to death"  She sees a doctor about removing spinal stimulator mid-June.  She would like to know what suggestions MD has in the meantime  Educated patient on ED precautions and advised would follow up with her after MD reviews this note

## 2021-03-18 NOTE — Telephone Encounter (Signed)
APP or PCP - not sure how much is cardiac or not - seems like symptoms happening more frequently, though.  Dr Lemmie Evens

## 2021-03-18 NOTE — Telephone Encounter (Signed)
Pt c/o Shortness Of Breath: STAT if SOB developed within the last 24 hours or pt is noticeably SOB on the phone  1. Are you currently SOB (can you hear that pt is SOB on the phone)? Not right at this time   2. How long have you been experiencing SOB s about 2 months, but getting worse   3. Are you SOB when sitting or when up moving around? Moving around and going up steps   4. Are you currently experiencing any other symptoms?  Chest pain, right now, but not that bad

## 2021-03-27 ENCOUNTER — Other Ambulatory Visit (HOSPITAL_COMMUNITY): Payer: Self-pay

## 2021-03-27 ENCOUNTER — Encounter: Payer: Self-pay | Admitting: Internal Medicine

## 2021-03-27 ENCOUNTER — Encounter: Payer: PPO | Attending: Physical Medicine & Rehabilitation | Admitting: Physical Medicine & Rehabilitation

## 2021-03-27 ENCOUNTER — Ambulatory Visit: Payer: PPO | Admitting: Internal Medicine

## 2021-03-27 ENCOUNTER — Other Ambulatory Visit: Payer: Self-pay

## 2021-03-27 VITALS — BP 172/108 | HR 88 | Ht 65.0 in | Wt 169.0 lb

## 2021-03-27 DIAGNOSIS — H8112 Benign paroxysmal vertigo, left ear: Secondary | ICD-10-CM | POA: Insufficient documentation

## 2021-03-27 DIAGNOSIS — R079 Chest pain, unspecified: Secondary | ICD-10-CM | POA: Diagnosis not present

## 2021-03-27 DIAGNOSIS — I10 Essential (primary) hypertension: Secondary | ICD-10-CM

## 2021-03-27 DIAGNOSIS — I422 Other hypertrophic cardiomyopathy: Secondary | ICD-10-CM

## 2021-03-27 DIAGNOSIS — R0602 Shortness of breath: Secondary | ICD-10-CM

## 2021-03-27 MED ORDER — TELMISARTAN-HCTZ 40-12.5 MG PO TABS
1.0000 | ORAL_TABLET | Freq: Every day | ORAL | 3 refills | Status: DC
  Filled 2021-03-27: qty 90, 90d supply, fill #0
  Filled 2021-07-02: qty 90, 90d supply, fill #1

## 2021-03-27 NOTE — Patient Instructions (Addendum)
Medication Instructions:  START telmisartan-hctz 40-12.5mg  daily  *If you need a refill on your cardiac medications before your next appointment, please call your pharmacy*   Lab Work: BMET - complete in 2 weeks  If you have labs (blood work) drawn today and your tests are completely normal, you will receive your results only by: [name] MyChart Message (if you have MyChart) OR . A paper copy in the mail If you have any lab test that is abnormal or we need to change your treatment, we will call you to review the results.   Testing/Procedures: Coronary CT study at [facility] -- you will get a call to schedule the test once approved with insurance   Follow-Up: At [facility], you and your health needs are our priority.  As part of our continuing mission to provide you with exceptional heart care, we have created designated Provider Care Teams.  These Care Teams include your primary Cardiologist (physician) and Advanced Practice Providers (APPs -  Physician Assistants and Nurse Practitioners) who all work together to provide you with the care you need, when you need it.  We recommend signing up for the patient portal called "MyChart".  Sign up information is provided on this After Visit Summary.  MyChart is used to connect with patients for Virtual Visits (Telemedicine).  Patients are able to view lab/test results, encounter notes, upcoming appointments, etc.  Non-urgent messages can be sent to your provider as well.   To learn more about what you can do with MyChart, go to [email or web address].    Your next appointment:   2 month(s)  The format for your next appointment:   In Person  Provider:   You may see Pixie Casino, MD or one of the following Advanced Practice Providers on your designated Care Team:    Almyra Deforest, PA-C  Fabian Sharp, PA-C or   Roby Lofts, [place]    Other Instructions Your cardiac CT will be scheduled at one of the below locations:   [facility] [address] [place], [place] [address] [phone]  [place] [address] [place], [place] [address] [phone]  If scheduled at [facility], please arrive at the [facility] main entrance (entrance A) of [facility] 30 minutes prior to test start time. Proceed to the [facility] Radiology Department (first floor) to check-in and test prep.  If scheduled at [facility], please arrive 15 mins early for check-in and test prep.  Please follow these instructions carefully (unless otherwise directed):  Hold all erectile dysfunction medications at least 3 days (72 hrs) prior to test.  On the Night Before the Test: . Be sure to Drink plenty of water. . Do not consume any caffeinated/decaffeinated beverages or chocolate 12 hours prior to your test. . Do not take any antihistamines 12 hours prior to your test.  On the Day of the Test: . Drink plenty of water until 1 hour prior to the test. . Do not eat any food 4 hours prior to the test. . You may take your regular medications prior to the test.  . Take EXTRA TABLET OF CARVEDILOL 1 hour prior to test/morning of . HOLD Furosemide/Hydrochlorothiazide morning of the test. . FEMALES- please wear underwire-free bra if available  After the Test: . Drink plenty of water. . After receiving IV contrast, you may experience a mild flushed feeling. This is normal. . On occasion, you may  experience a mild rash up to 24 hours after the test. This is not dangerous. If this occurs, you can take Benadryl 25 mg and increase your fluid intake. . If you experience trouble breathing, this can be serious. If it is severe call 911 IMMEDIATELY. If it is mild, please call our office. . If you take any of these medications: Glipizide/Metformin, Avandament, Glucavance, please do not take 48 hours after completing test unless otherwise  instructed.   Once we have confirmed authorization from your insurance company, we will call you to set up a date and time for your test. Based on how quickly your insurance processes prior authorizations requests, please allow up to 4 weeks to be contacted for scheduling your Cardiac CT appointment. Be advised that routine Cardiac CT appointments could be scheduled as many as 8 weeks after your provider has ordered it.  For non-scheduling related questions, please contact the cardiac imaging nurse navigator should you have any questions/concerns: Marchia Bond, Cardiac Imaging Nurse Navigator Gordy Clement, Cardiac Imaging Nurse Navigator [facility] Direct Office Dial: [phone]   For scheduling needs, including cancellations and rescheduling, please call [place], [phone].

## 2021-03-29 NOTE — Progress Notes (Signed)
OFFICE NOTE  Chief Complaint:  Chest pain, dyspnea  Primary Care Physician: [name], [name], MD  HPI:  [name] is a 60 y.o. female with a past medial history significant for hypertension, dyslipidemia, lumbar back pain, anxiety and echo evidence of possible hypertrophic cardiomyopathy.  I had seen her in the past in the hospital but basically she has been followed by our APP's in the office until recently.  She had an echo in February which showed dynamic LVEF 65 to 70% with severe asymmetric left ventricular hypertrophy and a septal dimension of 1.9 cm.  I had recommended a cardiac MRI however this was not performed due to the fact that she has spinal stimulator.  Subsequently she had another echo 2 months later that I did not order but was interpreted as having normal septal thickness.  I personally reviewed the echo and disagree with that finding.  The septum is completely mis-measured.  It actually does measure about 2 cm.  This is consistent with prior findings.  There is family history concerning for hypertrophic cardiomyopathy.  We discussed that in more detail today.  She does have uncontrolled hypertension as well and blood pressure was quite high today 174/102.  She seems to be tolerating carvedilol which was started in the hospital and likely would benefit from more.  With regards to her spinal stimulator, her orthopedic spine surgeon recommended that she could either have the leads replaced with MRI compatible or perhaps have it removed.  She says it actually does not seem to have helped her very much and she is considering having it removed.  03/27/2021  [name] returns today for follow-up.  She continues to have concerns about chest discomfort.  We had discussed pursuing a possible cardiac MRI to evaluate for cardiomyopathy although I do not think these are causing her symptoms.  The issue is that she would have to have her spinal stimulator lead removed  and her orthospine surgeon did not want to do this.  She is sought out another opinion from and may have that performed at an outside hospital.  Blood pressure looks like it continues to be an issue and this may be actually causing a lot of her symptoms.  Of course she could have coronary disease that we have not picked up on as well.  PMHx:  Past Medical History:  Diagnosis Date  . Anxiety   . Back pain    related to spinal stenosis and disc problem, radiates down left buttocks to leg., weakness occ.  [name] Dyspnea   . Headache   . Hyperlipidemia   . Hypertension   . Lumbar post-laminectomy syndrome   . PONV (postoperative nausea and vomiting)   . Vaginal foreign object    "Uses Femring"    Past Surgical History:  Procedure Laterality Date  . ABDOMINAL HYSTERECTOMY    . BIOPSY  12/16/2020   Procedure: BIOPSY;  Surgeon: Ladene Artist, MD;  Location: [place];  Service: Endoscopy;;  . CARDIAC CATHETERIZATION N/A 04/18/2015   Procedure: Left Heart Cath and Coronary Angiography;  Surgeon: [name], MD;  Location: [place] [facility];  Service: Cardiovascular;  Laterality: N/A;  . COLONOSCOPY W/ BIOPSIES AND POLYPECTOMY    . ESOPHAGOGASTRODUODENOSCOPY (EGD) WITH PROPOFOL N/A 12/16/2020   Procedure: ESOPHAGOGASTRODUODENOSCOPY (EGD) WITH PROPOFOL;  Surgeon: Ladene Artist, MD;  Location: [facility] ENDOSCOPY;  Service: Endoscopy;  Laterality: N/A;  . FOOT SURGERY Bilateral    [place] "bunion,bone spur, tendon" (  1) -6'16, (1)-10'16  . IR EPIDUROGRAPHY  07/21/2018  . LUMBAR LAMINECTOMY/DECOMPRESSION MICRODISCECTOMY Bilateral 12/28/2015   Procedure: MICRO LUMBAR DECOMPRESSION L4 - L5 BILATERALLY;  Surgeon: [name], MD;  [facility];  Service: Orthopedics;  Laterality: Bilateral;  . LUMBAR LAMINECTOMY/DECOMPRESSION MICRODISCECTOMY Bilateral 03/04/2018   Procedure: Revision of Microlumbar Decompression Bilateral Lumbar Four-Five;  Surgeon: [name], MD;  Location: [place];   Service: Orthopedics;  Laterality: Bilateral;  90 mins  . SAVORY DILATION N/A 12/16/2020   Procedure: SAVORY DILATION;  Surgeon: Ladene Artist, MD;  Location: [facility] ENDOSCOPY;  Service: Endoscopy;  Laterality: N/A;  . SPINAL CORD STIMULATOR INSERTION N/A 09/28/2019   Procedure: THORACIC SPINAL CORD STIMULATOR INSERTION;  Surgeon: Melina Schools, MD;  Location: [place];  Service: Orthopedics;  Laterality: N/A;  2.5 hrs  . TUBAL LIGATION    . WISDOM TOOTH EXTRACTION    . WOUND EXPLORATION N/A 03/04/2018   Procedure: EXPLORATION OF LUMBAR DECOMPRESSION WOUND;  Surgeon: [name], MD;  Location: [place];  Service: Orthopedics;  Laterality: N/A;    FAMHx:  Family History  Problem Relation Age of Onset  . Heart attack Mother   . Lung cancer Father   . Cancer Father   . Pancreatic cancer Sister   . Breast cancer Sister 20  . Throat cancer Brother   . Multiple myeloma Sister   . Breast cancer Sister        diagnosed in her 26's  . Heart attack Sister   . Stomach cancer Cousin   . Colon cancer Neg Hx     SOCHx:   reports that she has never smoked. She has never used smokeless tobacco. She reports that she does not drink alcohol and does not use drugs.  ALLERGIES:  Allergies  Allergen Reactions  . Cephalosporins Anaphylaxis  . Penicillins Anaphylaxis and Hives    Did it involve swelling of the face/tongue/throat, SOB, or low BP? Yes Did it involve sudden or severe rash/hives, skin peeling, or any reaction on the inside of your mouth or nose? Yes Did you need to seek medical attention at a hospital or doctor's office? Yes When did it last happen?10+ years If all above answers are "NO", may proceed with cephalosporin use.    . Anesthetics, Amide Nausea And Vomiting    Does not know name of it. States they put it on record foot center.   . Betadine [Povidone Iodine] Other (See Comments)    "Skin Burn" caused scar on Left buttock  . Peach [Prunus Persica] Hives  . Claritin  [Loratadine]     Rash, pruritis   . Latex Hives, Itching and Rash    ROS: Pertinent items noted in HPI and remainder of comprehensive ROS otherwise negative.  HOME MEDS: Current Outpatient Medications on File Prior to Visit  Medication Sig Dispense Refill  . ALPRAZolam (XANAX) 0.25 MG tablet TAKE 1 TABLET BY MOUTH ONCE DAILY AS NEEDED FOR ANXIETY OR SLEEP 30 tablet 0  . atorvastatin (LIPITOR) 20 MG tablet TAKE 1 TABLET BY MOUTH ONCE DAILY 90 tablet 3  . baclofen (LIORESAL) 10 MG tablet TAKE 1 TABLET BY MOUTH THREE TIMES DAILY AS NEEDED 90 tablet 3  . carvedilol (COREG) 25 MG tablet Take 1 tablet (25 mg total) by mouth 2 (two) times daily with a meal. 180 tablet 3  . cholecalciferol (VITAMIN D3) 25 MCG (1000 UNIT) tablet Take 1,000 Units by mouth daily.    . diphenhydrAMINE (BENADRYL) 25 MG tablet Take 25 mg by  mouth daily as needed for allergies.    . DULoxetine (CYMBALTA) 30 MG capsule TAKE 1 CAPSULE BY MOUTH DAILY 30 capsule 2  . Estradiol Acetate (FEMRING) 0.05 MG/24HR RING USE ONE RING VAGINALLY EVERY 3 MONTHS AS DIRECTED 1 each 2  . FEMRING 0.05 MG/24HR RING SMARTSIG:1 Ring Vaginal Every 3 Months    . gabapentin (NEURONTIN) 400 MG capsule TAKE 1 CAPSULE BY MOUTH 3 TIMES DAILY 90 capsule 3  . meclizine (ANTIVERT) 25 MG tablet TAKE 1 TABLET BY MOUTH 2 TIMES DAILY AS NEEDED FOR DIZZINESS 20 tablet 0  . methocarbamol (ROBAXIN) 500 MG tablet TAKE 1 TABLET BY MOUTH AT BEDTIME AS NEEDED 90 tablet 0  . PFIZER-BIONT COVID-19 VAC-TRIS SUSP injection     . traMADol (ULTRAM) 50 MG tablet TAKE 1 TABLET BY MOUTH 3 TIMES DAILY AS NEEDED FOR PAIN 90 tablet 0   No current facility-administered medications on file prior to visit.    LABS/IMAGING: No results found for this or any previous visit (from the past 48 hour(s)). No results found.  LIPID PANEL:    Component Value Date/Time   CHOL 198 04/20/2020 0955   TRIG 62.0 04/20/2020 0955   HDL 61.70 04/20/2020 0955   CHOLHDL 3 04/20/2020 0955    VLDL 12.4 04/20/2020 0955   LDLCALC 123 (H) 04/20/2020 0955     WEIGHTS: Wt Readings from Last 3 Encounters:  03/27/21 169 lb (76.7 kg)  02/07/21 179 lb (81.2 kg)  01/01/21 179 lb (81.2 kg)    VITALS: BP (!) 172/108 (BP Location: Left Arm, Patient Position: Sitting, Cuff Size: Normal)   Pulse 88   Ht $R'5\' 5"'Ni$  (1.651 m)   Wt 169 lb (76.7 kg)   BMI 28.12 kg/m   EXAM: General appearance: alert and no distress Neck: no carotid bruit, no JVD and thyroid not enlarged, symmetric, no tenderness/mass/nodules Lungs: clear to auscultation bilaterally Heart: regular rate and rhythm, S1, S2 normal, no murmur, click, rub or gallop Abdomen: soft, non-tender; bowel sounds normal; no masses,  no organomegaly Extremities: extremities normal, atraumatic, no cyanosis or edema Pulses: 2+ and symmetric Skin: Skin color, texture, turgor normal. No rashes or lesions Neurologic: Grossly normal Psych: Pleasant   EKG: Normal sinus rhythm at 86, possible left atrial enlargement at 88- personally reviewed  ASSESSMENT: 1. Chest pain/dyspnea 2. Echocardiographic features of hypertrophic cardiomyopathy, IVSD 1.9 cm (11/2020) 3. No evidence for LVOT obstruction 4. Uncontrolled hypertension  PLAN: 1.   Ms. Battaglia continues to struggle with recurrent chest pain as well as dyspnea and uncontrolled hypertension.  I to get a CT coronary angiogram to rule out any obstructive coronary disease.  In addition we will start combination telmisartan/HCTZ 40/12.5 mg daily.  She will continue on her current dose of the carvedilol.  Hopefully this will additionally help with her blood pressure and symptoms.  Plan follow-up with me afterwards.  Point we could consider cardiac MRI to look at her increased LV wall thickness after her spinal stimulator leads are removed.  Pixie Casino, MD, [facility], [place] Director of the Advanced Lipid Disorders &  Cardiovascular Risk Reduction  Clinic Diplomate of the American Board of Clinical Lipidology Attending Cardiologist  Direct Dial: [phone]  Fax: [phone]  Website:  [email or web address]   Nadean Corwin Hans Rusher 03/29/2021, 1:40 PM

## 2021-04-01 ENCOUNTER — Telehealth: Payer: Self-pay | Admitting: Internal Medicine

## 2021-04-01 NOTE — Telephone Encounter (Signed)
Spoke with patient who reports she is claustrophobic and is requesting a PRN med for her CT test on 6/21. Advised MD is out of office this week but will send to him to review upon his return.

## 2021-04-01 NOTE — Telephone Encounter (Signed)
[name] is calling stating she is very claustrophobic and when she was called in regards to her CT they advised her to call in and ask that something be prescribed for her to take the before having her CT performed. Please advise.

## 2021-04-02 DIAGNOSIS — G894 Chronic pain syndrome: Secondary | ICD-10-CM | POA: Diagnosis not present

## 2021-04-04 ENCOUNTER — Telehealth: Payer: Self-pay | Admitting: *Deleted

## 2021-04-04 NOTE — Telephone Encounter (Signed)
Patient scheduled for coronary CTA 04/16/2021.  We will await imaging results.

## 2021-04-04 NOTE — Telephone Encounter (Signed)
   [facility] Pre-operative Risk Assessment    Patient Name: [name]  DOB: 1961/05/18  MRN: [id number]   HEARTCARE STAFF: - Please ensure there is not already an duplicate clearance open for this procedure. - Under Visit Info/Reason for Call, type in Other and utilize the format Clearance MM/DD/YY or Clearance TBD. Do not use dashes or single digits. - If request is for dental extraction, please clarify the # of teeth to be extracted. - If the patient is currently at the dentist's office, call Pre-Op APP to address. If the patient is not currently in the dentist office, please route to the Pre-Op pool  Request for surgical clearance:  What type of surgery is being performed? Removal of spinal cord stimulator   When is this surgery scheduled? 05/13/2021   What type of clearance is required (medical clearance vs. Pharmacy clearance to hold med vs. Both)? Medical   Are there any medications that need to be held prior to surgery and how long?None   Practice name and name of physician performing surgery? [facility], Dr Deetta Perla, MD  What is the office phone number? [phone]   7.   What is the office fax number?                  [phone]  8.   Anesthesia type (None, local, MAC, general) ? UnKnown   Barbaraann Barthel 04/04/2021, 1:12 PM  _________________________________________________________________   (provider comments below)

## 2021-04-08 ENCOUNTER — Other Ambulatory Visit (HOSPITAL_COMMUNITY): Payer: Self-pay

## 2021-04-08 DIAGNOSIS — R079 Chest pain, unspecified: Secondary | ICD-10-CM | POA: Diagnosis not present

## 2021-04-08 DIAGNOSIS — I422 Other hypertrophic cardiomyopathy: Secondary | ICD-10-CM | POA: Diagnosis not present

## 2021-04-08 DIAGNOSIS — I10 Essential (primary) hypertension: Secondary | ICD-10-CM | POA: Diagnosis not present

## 2021-04-08 DIAGNOSIS — R0602 Shortness of breath: Secondary | ICD-10-CM | POA: Diagnosis not present

## 2021-04-08 LAB — BASIC METABOLIC PANEL
BUN/Creatinine Ratio: 11 (ref 9–23)
BUN: 12 mg/dL (ref 6–24)
CO2: 28 mmol/L (ref 20–29)
Calcium: 9.4 mg/dL (ref 8.7–10.2)
Chloride: 101 mmol/L (ref 96–106)
Creatinine, Ser: 1.12 mg/dL — ABNORMAL HIGH (ref 0.57–1.00)
Glucose: 74 mg/dL (ref 65–99)
Potassium: 4.3 mmol/L (ref 3.5–5.2)
Sodium: 143 mmol/L (ref 134–144)
eGFR: 57 mL/min/{1.73_m2} — ABNORMAL LOW (ref >59)

## 2021-04-08 MED ORDER — LORAZEPAM 1 MG PO TABS
1.0000 mg | ORAL_TABLET | ORAL | 0 refills | Status: DC
Start: 2021-04-08 — End: 2021-05-03
  Filled 2021-04-08: qty 1, 1d supply, fill #0

## 2021-04-08 MED ORDER — LORAZEPAM 1 MG PO TABS: 1.0000 mg | ORAL_TABLET | ORAL | 0 refills | Status: DC | PRN

## 2021-04-08 NOTE — Telephone Encounter (Signed)
Message  Ok to give 1 mg po lorezepam on call for scan.    Dr Lemmie Evens

## 2021-04-08 NOTE — Telephone Encounter (Signed)
Med called to [facility] outpatient pharmacy Patient aware and advised to have driver the day of test

## 2021-04-12 ENCOUNTER — Telehealth (HOSPITAL_COMMUNITY): Payer: Self-pay | Admitting: Emergency Medicine

## 2021-04-12 ENCOUNTER — Other Ambulatory Visit: Payer: Self-pay | Admitting: Neurosurgery

## 2021-04-12 NOTE — Telephone Encounter (Signed)
Reaching out to patient to offer assistance regarding upcoming cardiac imaging study; pt verbalizes understanding of appt date/time, parking situation and where to check in, pre-test NPO status and medications ordered, and verified current allergies; name and call back number provided for further questions should they arise Marchia Bond RN Navigator Cardiac Imaging [facility] [phone] office [phone] cell  Pt taking an extra carvedilol, holding HCTZ 1mg  ativan 1 hr prior for claustro Clarise Cruz

## 2021-04-15 ENCOUNTER — Other Ambulatory Visit: Payer: Self-pay | Admitting: Neurosurgery

## 2021-04-16 ENCOUNTER — Other Ambulatory Visit: Payer: Self-pay

## 2021-04-16 ENCOUNTER — Ambulatory Visit (HOSPITAL_COMMUNITY)
Admission: RE | Admit: 2021-04-16 | Discharge: 2021-04-16 | Disposition: A | Discharge status: Home / Self Care | Payer: PPO | Source: Ambulatory Visit | Attending: Internal Medicine | Admitting: Internal Medicine

## 2021-04-16 DIAGNOSIS — I700 Atherosclerosis of aorta: Secondary | ICD-10-CM

## 2021-04-16 DIAGNOSIS — R0602 Shortness of breath: Secondary | ICD-10-CM | POA: Diagnosis not present

## 2021-04-16 DIAGNOSIS — R079 Chest pain, unspecified: Secondary | ICD-10-CM | POA: Diagnosis not present

## 2021-04-16 DIAGNOSIS — I10 Essential (primary) hypertension: Secondary | ICD-10-CM

## 2021-04-16 DIAGNOSIS — I422 Other hypertrophic cardiomyopathy: Secondary | ICD-10-CM

## 2021-04-16 HISTORY — DX: Atherosclerosis of aorta: I70.0

## 2021-04-16 MED ORDER — NITROGLYCERIN 0.4 MG SL SUBL
SUBLINGUAL_TABLET | SUBLINGUAL | Status: AC
  Filled 2021-04-16: qty 2

## 2021-04-16 MED ORDER — IOHEXOL 350 MG/ML SOLN
95.0000 mL | Freq: Once | INTRAVENOUS | Status: AC | PRN
  Administered 2021-04-16: 95 mL via INTRAVENOUS

## 2021-04-16 MED ORDER — NITROGLYCERIN 0.4 MG SL SUBL
0.8000 mg | SUBLINGUAL_TABLET | Freq: Once | SUBLINGUAL | Status: AC
  Administered 2021-04-16: 0.8 mg via SUBLINGUAL

## 2021-04-17 NOTE — Telephone Encounter (Signed)
   Name: [name]  DOB: Aug 30, 1961  MRN: [id number]   Primary Cardiologist: Pixie Casino, MD  Chart reviewed as part of pre-operative protocol coverage. Patient was contacted 04/17/2021 in reference to pre-operative risk assessment for pending surgery as outlined below.  [name] was last seen on 03/27/21 by Dr. Debara Pickett.  Since that day, [name] has done well. She had a CT coronary which showed a calcium score zero, no areas of calcification or blockages. Will defer cardiac MRI until after stimulator is removed.  Therefore, based on ACC/AHA guidelines, the patient would be at acceptable risk for the planned procedure without further cardiovascular testing.   The patient was advised that if she develops new symptoms prior to surgery to contact our office to arrange for a follow-up visit, and she verbalized understanding.  I will route this recommendation to the requesting party via Epic fax function and remove from pre-op pool. Please call with questions.  Tami Lin Farhan Jean, PA 04/17/2021, 3:45 PM

## 2021-05-01 ENCOUNTER — Encounter: Payer: Self-pay | Admitting: Physical Medicine & Rehabilitation

## 2021-05-01 ENCOUNTER — Other Ambulatory Visit: Payer: Self-pay

## 2021-05-01 ENCOUNTER — Encounter: Payer: PPO | Attending: Physical Medicine & Rehabilitation | Admitting: Physical Medicine & Rehabilitation

## 2021-05-01 ENCOUNTER — Other Ambulatory Visit (HOSPITAL_COMMUNITY): Payer: Self-pay

## 2021-05-01 VITALS — BP 130/83 | HR 71 | Temp 98.4°F | Ht 65.0 in | Wt 169.0 lb

## 2021-05-01 DIAGNOSIS — M5416 Radiculopathy, lumbar region: Secondary | ICD-10-CM

## 2021-05-01 DIAGNOSIS — G8918 Other acute postprocedural pain: Secondary | ICD-10-CM | POA: Insufficient documentation

## 2021-05-01 DIAGNOSIS — M5410 Radiculopathy, site unspecified: Secondary | ICD-10-CM | POA: Diagnosis not present

## 2021-05-01 MED ORDER — TRAMADOL HCL 50 MG PO TABS
ORAL_TABLET | ORAL | 0 refills | Status: DC
  Filled 2021-05-01: qty 21, 7d supply, fill #0

## 2021-05-01 NOTE — Patient Instructions (Signed)
PLEASE FEEL FREE TO CALL OUR OFFICE WITH ANY PROBLEMS OR QUESTIONS ([phone])      

## 2021-05-01 NOTE — Progress Notes (Signed)
Subjective:    Patient ID: [name], female    DOB: 11/19/1960, 60 y.o.   MRN: [id number]  HPI  [name] is here in follow up of her chronci low back pain and radiculopathy. She developed syncope and echo revealed septal hypertrophy. She needs a cardiac MRI which will require her stimulator to be removed. Dr. Adriana Simas is  taking it out later this month at [facility].  Her pain levels have been fairly stable. She paces herself especially in the heat. She uses her quad cane for balance. AFO is not providing enough support. The velcro is loosening and she feels that its increasingly difficult to advance her right leg as well.   She uses tramadol for breakthrough pain per Dr. Ethelene Hal. The stimulator does provide her some relief, but not overhwhelmingly so.    Pain Inventory Average Pain 5 Pain Right Now 5 My pain is burning and aching  In the last 24 hours, has pain interfered with the following? General activity 3 Relation with others 3 Enjoyment of life 3 What TIME of day is your pain at its worst? morning  and evening Sleep (in general) Fair  Pain is worse with: walking, bending, sitting, standing, and some activites Pain improves with: rest, heat/ice, therapy/exercise, pacing activities, and medication Relief from Meds: 7  Family History  Problem Relation Age of Onset   Heart attack Mother    Lung cancer Father    Cancer Father    Pancreatic cancer Sister    Breast cancer Sister 42   Throat cancer Brother    Multiple myeloma Sister    Breast cancer Sister        diagnosed in her 32's   Heart attack Sister    Stomach cancer Cousin    Colon cancer Neg Hx    Social History   Socioeconomic History   Marital status: Married    Spouse name: Not on file   Number of children: Not on file   Years of education: Not on file   Highest education level: Not on file  Occupational History   Not on file  Tobacco Use   Smoking status: Never   Smokeless tobacco: Never   Vaping Use   Vaping Use: Never used  Substance and Sexual Activity   Alcohol use: No   Drug use: No   Sexual activity: Yes    Partners: Male    Birth control/protection: Post-menopausal  Other Topics Concern   Not on file  Social History Narrative   Not on file   Social Determinants of Health   Financial Resource Strain: Not on file  Food Insecurity: Not on file  Transportation Needs: Not on file  Physical Activity: Not on file  Stress: Not on file  Social Connections: Not on file   Past Surgical History:  Procedure Laterality Date   ABDOMINAL HYSTERECTOMY     BIOPSY  12/16/2020   Procedure: BIOPSY;  Surgeon: Meryl Dare, MD;  Location: [facility] ENDOSCOPY;  Service: Endoscopy;;   CARDIAC CATHETERIZATION N/A 04/18/2015   Procedure: Left Heart Cath and Coronary Angiography;  Surgeon: Rinaldo Cloud, MD;  Location: [facility] INVASIVE CV LAB;  Service: Cardiovascular;  Laterality: N/A;   COLONOSCOPY W/ BIOPSIES AND POLYPECTOMY     ESOPHAGOGASTRODUODENOSCOPY (EGD) WITH PROPOFOL N/A 12/16/2020   Procedure: ESOPHAGOGASTRODUODENOSCOPY (EGD) WITH PROPOFOL;  Surgeon: Meryl Dare, MD;  Location: [facility] ENDOSCOPY;  Service: Endoscopy;  Laterality: N/A;   FOOT SURGERY Bilateral    [facility] "bunion,bone spur,  tendon" (1) -6'16, (1)-10'16   IR EPIDUROGRAPHY  07/21/2018   LUMBAR LAMINECTOMY/DECOMPRESSION MICRODISCECTOMY Bilateral 12/28/2015   Procedure: MICRO LUMBAR DECOMPRESSION L4 - L5 BILATERALLY;  Surgeon: [name], MD;  [facility];  Service: Orthopedics;  Laterality: Bilateral;   LUMBAR LAMINECTOMY/DECOMPRESSION MICRODISCECTOMY Bilateral 03/04/2018   Procedure: Revision of Microlumbar Decompression Bilateral Lumbar Four-Five;  Surgeon: [name], MD;  Location: [place];  Service: Orthopedics;  Laterality: Bilateral;  90 mins   SAVORY DILATION N/A 12/16/2020   Procedure: SAVORY DILATION;  Surgeon: Ladene Artist, MD;  Location: [facility] ENDOSCOPY;  Service: Endoscopy;  Laterality:  N/A;   SPINAL CORD STIMULATOR INSERTION N/A 09/28/2019   Procedure: THORACIC SPINAL CORD STIMULATOR INSERTION;  Surgeon: Melina Schools, MD;  Location: [place];  Service: Orthopedics;  Laterality: N/A;  2.5 hrs   TUBAL LIGATION     WISDOM TOOTH EXTRACTION     WOUND EXPLORATION N/A 03/04/2018   Procedure: EXPLORATION OF LUMBAR DECOMPRESSION WOUND;  Surgeon: [name], MD;  Location: [place];  Service: Orthopedics;  Laterality: N/A;   Past Surgical History:  Procedure Laterality Date   ABDOMINAL HYSTERECTOMY     BIOPSY  12/16/2020   Procedure: BIOPSY;  Surgeon: Ladene Artist, MD;  Location: [place];  Service: Endoscopy;;   CARDIAC CATHETERIZATION N/A 04/18/2015   Procedure: Left Heart Cath and Coronary Angiography;  Surgeon: [name], MD;  Location: [place] [facility];  Service: Cardiovascular;  Laterality: N/A;   COLONOSCOPY W/ BIOPSIES AND POLYPECTOMY     ESOPHAGOGASTRODUODENOSCOPY (EGD) WITH PROPOFOL N/A 12/16/2020   Procedure: ESOPHAGOGASTRODUODENOSCOPY (EGD) WITH PROPOFOL;  Surgeon: Ladene Artist, MD;  Location: [facility] ENDOSCOPY;  Service: Endoscopy;  Laterality: N/A;   FOOT SURGERY Bilateral    [place] "bunion,bone spur, tendon" (1) -6'16, (1)-10'16   IR EPIDUROGRAPHY  07/21/2018   LUMBAR LAMINECTOMY/DECOMPRESSION MICRODISCECTOMY Bilateral 12/28/2015   Procedure: MICRO LUMBAR DECOMPRESSION L4 - L5 BILATERALLY;  Surgeon: [name], MD;  [facility];  Service: Orthopedics;  Laterality: Bilateral;   LUMBAR LAMINECTOMY/DECOMPRESSION MICRODISCECTOMY Bilateral 03/04/2018   Procedure: Revision of Microlumbar Decompression Bilateral Lumbar Four-Five;  Surgeon: [name], MD;  Location: [place];  Service: Orthopedics;  Laterality: Bilateral;  90 mins   SAVORY DILATION N/A 12/16/2020   Procedure: SAVORY DILATION;  Surgeon: Ladene Artist, MD;  Location: [facility] ENDOSCOPY;  Service: Endoscopy;  Laterality: N/A;   SPINAL CORD STIMULATOR INSERTION N/A 09/28/2019   Procedure:  THORACIC SPINAL CORD STIMULATOR INSERTION;  Surgeon: Melina Schools, MD;  Location: [place];  Service: Orthopedics;  Laterality: N/A;  2.5 hrs   TUBAL LIGATION     WISDOM TOOTH EXTRACTION     WOUND EXPLORATION N/A 03/04/2018   Procedure: EXPLORATION OF LUMBAR DECOMPRESSION WOUND;  Surgeon: [name], MD;  Location: [place];  Service: Orthopedics;  Laterality: N/A;   Past Medical History:  Diagnosis Date   Anxiety    Back pain    related to spinal stenosis and disc problem, radiates down left buttocks to leg., weakness occ.   Dyspnea    Headache    Hyperlipidemia    Hypertension    Lumbar post-laminectomy syndrome    PONV (postoperative nausea and vomiting)    Vaginal foreign object    "Uses Femring"   BP 130/83   Pulse 71   Temp 98.4 F (36.9 C)   Ht $R'5\' 5"'ia$  (1.651 m)   Wt 169 lb (76.7 kg)   SpO2 98%   BMI 28.12 kg/m   Opioid Risk  Score:   Fall Risk Score:  `1  Depression screen PHQ 2/9  Depression screen [facility] 2/9 04/22/2020 08/17/2018 07/05/2018  Decreased Interest 2 0 0  Down, Depressed, Hopeless 0 0 0  PHQ - 2 Score 2 0 0  Altered sleeping 2 - -  Tired, decreased energy 2 - -  Change in appetite 1 - -  Feeling bad or failure about yourself  0 - -  Trouble concentrating 2 - -  Moving slowly or fidgety/restless 1 - -  Suicidal thoughts 0 - -  PHQ-9 Score 10 - -  Difficult doing work/chores Very difficult - -  Some recent data might be hidden    Review of Systems  Constitutional: Negative.   HENT: Negative.    Eyes: Negative.   Respiratory: Negative.    Cardiovascular: Negative.   Gastrointestinal: Negative.   Endocrine: Negative.   Genitourinary: Negative.   Musculoskeletal:  Positive for gait problem.  Skin: Negative.   Allergic/Immunologic: Negative.   Neurological:  Positive for weakness.  Psychiatric/Behavioral: Negative.    All other systems reviewed and are negative.     Objective:   Physical Exam  General: No acute distress HEENT: EOMI, oral  membranes moist Cards: reg rate  Chest: normal effort Abdomen: Soft, NT, ND Skin: dry, intact Extremities: no edema Psych: pleasant and appropriate Skin: intact Neuro: RLE 2/5 to 3/5... LLE 4/5. Sensory loss distally bilaterally.  Musculoskeletal: LB TTP  stimulator. AFO velcro is loose, knee hyperextends with AFO in stance           Assessment & Plan:  1. Post Lumbar Laminectomy / Decompression: Lumbar Radiculopathy:             -gabapentin $RemoveBefo'400mg'[id number]$  TID             -tramadol--I will assume presciption since she's leaving [facility].              -cymbalta $RemoveBe'30mg'VpSxnQwnP$  daily             -Spinal stimulator to come out d/t need for cardiac MRI--7/18 2. New onset left BPPV-referral to neuro-rehab for gait/vestibular rx----eventually aquatic therapy when monitoring is complete             -home vestibular exercises 3. Anxiety. PCP Following and prescribing Alprazolam. Education Provided. Continue to Monitor.              -resume cymbalta $RemoveBeforeDE'30mg'[id number]$  daily 4. Spasms: may continue robaxin 5. Orthotics:  -will send to [facility] for ?replacement brace.     Fifteen minutes of face to face patient care time were spent during this visit. All questions were encouraged and answered.  Follow up with me in 3 mos .

## 2021-05-02 ENCOUNTER — Other Ambulatory Visit: Payer: Self-pay | Admitting: Internal Medicine

## 2021-05-02 DIAGNOSIS — R911 Solitary pulmonary nodule: Secondary | ICD-10-CM

## 2021-05-03 ENCOUNTER — Other Ambulatory Visit (HOSPITAL_COMMUNITY): Payer: Self-pay

## 2021-05-03 ENCOUNTER — Other Ambulatory Visit: Payer: Self-pay

## 2021-05-03 ENCOUNTER — Ambulatory Visit: Payer: PPO | Admitting: Urgent Care

## 2021-05-03 ENCOUNTER — Encounter
Admission: RE | Admit: 2021-05-03 | Discharge: 2021-05-03 | Disposition: A | Discharge status: Home / Self Care | Payer: PPO | Source: Ambulatory Visit | Attending: Neurosurgery | Admitting: Neurosurgery

## 2021-05-03 ENCOUNTER — Ambulatory Visit (INDEPENDENT_AMBULATORY_CARE_PROVIDER_SITE_OTHER): Payer: PPO | Admitting: Family Medicine

## 2021-05-03 ENCOUNTER — Encounter: Payer: Self-pay | Admitting: Family Medicine

## 2021-05-03 VITALS — BP 130/70 | HR 85 | Temp 97.9°F | Resp 16 | Ht 65.0 in | Wt 169.0 lb

## 2021-05-03 DIAGNOSIS — I10 Essential (primary) hypertension: Secondary | ICD-10-CM

## 2021-05-03 DIAGNOSIS — F419 Anxiety disorder, unspecified: Secondary | ICD-10-CM | POA: Diagnosis not present

## 2021-05-03 DIAGNOSIS — F4321 Adjustment disorder with depressed mood: Secondary | ICD-10-CM

## 2021-05-03 DIAGNOSIS — R079 Chest pain, unspecified: Secondary | ICD-10-CM | POA: Diagnosis not present

## 2021-05-03 DIAGNOSIS — Z01812 Encounter for preprocedural laboratory examination: Secondary | ICD-10-CM | POA: Insufficient documentation

## 2021-05-03 LAB — SURGICAL PCR SCREEN
MRSA, PCR: NEGATIVE
Staphylococcus aureus: NEGATIVE

## 2021-05-03 LAB — BASIC METABOLIC PANEL
Anion gap: 6 (ref 5–15)
BUN: 11 mg/dL (ref 6–20)
CO2: 30 mmol/L (ref 22–32)
Calcium: 8.9 mg/dL (ref 8.9–10.3)
Chloride: 100 mmol/L (ref 98–111)
Creatinine, Ser: 0.98 mg/dL (ref 0.44–1.00)
GFR, Estimated: >60 mL/min (ref >60)
Glucose, Bld: 88 mg/dL (ref 70–99)
Potassium: 3.7 mmol/L (ref 3.5–5.1)
Sodium: 136 mmol/L (ref 135–145)

## 2021-05-03 LAB — TYPE AND SCREEN
ABO/RH(D): B POS
Antibody Screen: NEGATIVE

## 2021-05-03 LAB — CBC
HCT: 36.5 % (ref 36.0–46.0)
Hemoglobin: 12.3 g/dL (ref 12.0–15.0)
MCH: 29.9 pg (ref 26.0–34.0)
MCHC: 33.7 g/dL (ref 30.0–36.0)
MCV: 88.8 fL (ref 80.0–100.0)
Platelets: 269 10*3/uL (ref 150–400)
RBC: 4.11 MIL/uL (ref 3.87–5.11)
RDW: 13.3 % (ref 11.5–15.5)
WBC: 4.1 10*3/uL (ref 4.0–10.5)
nRBC: 0 % (ref 0.0–0.2)

## 2021-05-03 LAB — URINALYSIS, ROUTINE W REFLEX MICROSCOPIC
Bilirubin Urine: NEGATIVE
Glucose, UA: NEGATIVE mg/dL
Hgb urine dipstick: NEGATIVE
Ketones, ur: 5 mg/dL — AB
Leukocytes,Ua: NEGATIVE
Nitrite: NEGATIVE
Protein, ur: NEGATIVE mg/dL
Specific Gravity, Urine: 1.021 (ref 1.005–1.030)
pH: 7 (ref 5.0–8.0)

## 2021-05-03 MED ORDER — DULOXETINE HCL 60 MG PO CPEP
60.0000 mg | ORAL_CAPSULE | Freq: Every day | ORAL | 2 refills | Status: DC
  Filled 2021-05-03: qty 90, 90d supply, fill #0

## 2021-05-03 MED ORDER — ALPRAZOLAM 0.5 MG PO TABS
0.2500 mg | ORAL_TABLET | Freq: Every evening | ORAL | 1 refills | Status: DC | PRN
  Filled 2021-05-03: qty 30, 30d supply, fill #0
  Filled 2021-07-02: qty 30, 30d supply, fill #1

## 2021-05-03 NOTE — Patient Instructions (Addendum)
A few things to remember from today's visit:   Essential hypertension  Anxiety disorder, unspecified type  If you need refills please call your pharmacy. Do not use My Chart to request refills or for acute issues that need immediate attention.   Today Alprazolam resume, take 1/2-1 tab daily as needed, mainly at bedtime. Duloxetine increased from 30 mg to 60 mg. Continue monitoring blood pressure.  Please be sure medication list is accurate. If a new problem present, please set up appointment sooner than planned today.

## 2021-05-03 NOTE — Progress Notes (Signed)
HPI:  [name] is a 60 y.o. female, who is here today for 3 months follow up.   She was last seen on 02/18/21. No new problems since her last visit.  Mid chest tightness,intermittently, not radiated. It was worse when she learned about the death of her brother, a few days ago, it is better today. CP and hypertrophic cardiomyopathy, she follows with cardiologist, Dr Debara Pickett. Negative for severe/frequent headache,visual changes, SOB,palpitations, diaphoresis, new focal weakness,or edema.  Pending cardiac MRI but needs to have spinal stimulator removed, procedure planned for 05/13/21. BP has been "good" most of the time. BP at home 150's/?. She is on Telmisartan-HCTZ 40-12.5 mg daily and Carvedilol 25 mg bid.  Lab Results  Component Value Date   CREATININE 1.12 (H) 04/08/2021   BUN 12 04/08/2021   NA 143 04/08/2021   K 4.3 04/08/2021   CL 101 04/08/2021   CO2 28 04/08/2021   Anxiety: She has not been on Alprazolam 0.25 mg for a while. She is on Cymbalta 30 mg daily, which has also helped with back pain. She is having trouble falling asleep.  She is feeling depressed since her brother's death. He was found in his house death, apparently he has been death for a few days. He lived alone, police was contacted because her daughter could not reach him. He was 62 yo. No suicidal thoughts.  Review of Systems  Constitutional:  Positive for fatigue. Negative for activity change, appetite change and fever.  HENT:  Negative for mouth sores, nosebleeds and sore throat.   Eyes:  Negative for redness and visual disturbance.  Respiratory:  Negative for cough and wheezing.   Gastrointestinal:  Negative for abdominal pain, nausea and vomiting.       Negative for changes in bowel habits.  Genitourinary:  Negative for decreased urine volume and hematuria.  Musculoskeletal:  Positive for arthralgias and back pain. Negative for gait problem.  Neurological:  Negative for  syncope, facial asymmetry and weakness.  Psychiatric/Behavioral:  Positive for sleep disturbance. Negative for confusion and hallucinations. The patient is nervous/anxious.   Rest of ROS, see pertinent positives sand negatives in HPI  Current Outpatient Medications on File Prior to Visit  Medication Sig Dispense Refill   atorvastatin (LIPITOR) 20 MG tablet TAKE 1 TABLET BY MOUTH ONCE DAILY (Patient taking differently: Take 20 mg by mouth daily.) 90 tablet 3   baclofen (LIORESAL) 10 MG tablet TAKE 1 TABLET BY MOUTH THREE TIMES DAILY AS NEEDED (Patient taking differently: Take 10 mg by mouth daily as needed (Nerves).) 90 tablet 3   carvedilol (COREG) 25 MG tablet Take 1 tablet (25 mg total) by mouth 2 (two) times daily with a meal. 180 tablet 3   cholecalciferol (VITAMIN D3) 25 MCG (1000 UNIT) tablet Take 1,000 Units by mouth daily.     diphenhydrAMINE (BENADRYL) 25 MG tablet Take 25 mg by mouth daily as needed for itching.     Estradiol Acetate (FEMRING) 0.05 MG/24HR RING USE ONE RING VAGINALLY EVERY 3 MONTHS AS DIRECTED (Patient taking differently: every 3 (three) months.) 1 each 2   gabapentin (NEURONTIN) 400 MG capsule TAKE 1 CAPSULE BY MOUTH 3 TIMES DAILY (Patient taking differently: Take 400 mg by mouth 3 (three) times daily.) 90 capsule 3   meclizine (ANTIVERT) 25 MG tablet TAKE 1 TABLET BY MOUTH 2 TIMES DAILY AS NEEDED FOR DIZZINESS (Patient taking differently: Take 25 mg by mouth daily as needed for dizziness.) 20 tablet 0  methocarbamol (ROBAXIN) 500 MG tablet TAKE 1 TABLET BY MOUTH AT BEDTIME AS NEEDED (Patient taking differently: Take 500 mg by mouth at bedtime as needed for muscle spasms.) 90 tablet 0   PFIZER-BIONT COVID-19 VAC-TRIS SUSP injection      telmisartan-hydrochlorothiazide (MICARDIS HCT) 40-12.5 MG tablet Take 1 tablet by mouth daily. 90 tablet 3   traMADol (ULTRAM) 50 MG tablet TAKE 1 TABLET BY MOUTH 3 TIMES DAILY AS NEEDED FOR PAIN 90 tablet 0   No current  facility-administered medications on file prior to visit.   Past Medical History:  Diagnosis Date   Anxiety    Back pain    related to spinal stenosis and disc problem, radiates down left buttocks to leg., weakness occ.   Dyspnea    Headache    Hyperlipidemia    Hypertension    Lumbar post-laminectomy syndrome    PONV (postoperative nausea and vomiting)    Vaginal foreign object    "Uses Femring"   Allergies  Allergen Reactions   Cephalosporins Anaphylaxis   Penicillins Anaphylaxis and Hives    Did it involve swelling of the face/tongue/throat, SOB, or low BP? Yes Did it involve sudden or severe rash/hives, skin peeling, or any reaction on the inside of your mouth or nose? Yes Did you need to seek medical attention at a hospital or doctor's office? Yes When did it last happen?      10+ years If all above answers are "NO", may proceed with cephalosporin use.     Anesthetics, Amide Nausea And Vomiting    Does not know name of it. States they put it on record foot center.    Betadine [Povidone Iodine] Other (See Comments)    "Skin Burn" caused scar on Left buttock   Latex Hives, Itching and Rash   Peach [Prunus Persica] Hives   Claritin [Loratadine]     Rash, pruritis     Social History   Socioeconomic History   Marital status: Married    Spouse name: Not on file   Number of children: Not on file   Years of education: Not on file   Highest education level: Not on file  Occupational History   Not on file  Tobacco Use   Smoking status: Never   Smokeless tobacco: Never  Vaping Use   Vaping Use: Never used  Substance and Sexual Activity   Alcohol use: No   Drug use: No   Sexual activity: Yes    Partners: Male    Birth control/protection: Post-menopausal  Other Topics Concern   Not on file  Social History Narrative   Not on file   Social Determinants of Health   Financial Resource Strain: Not on file  Food Insecurity: Not on file  Transportation Needs: Not  on file  Physical Activity: Not on file  Stress: Not on file  Social Connections: Not on file   Vitals:   05/03/21 0828  BP: 130/70  Pulse: 85  Resp: 16  Temp: 97.9 F (36.6 C)  SpO2: 98%   Body mass index is 28.12 kg/m.  Physical Exam Vitals and nursing note reviewed.  Constitutional:      General: She is not in acute distress.    Appearance: She is well-developed.  HENT:     Head: Normocephalic and atraumatic.     Mouth/Throat:     Mouth: Mucous membranes are moist.     Pharynx: Oropharynx is clear.  Eyes:     Conjunctiva/sclera: Conjunctivae normal.  Cardiovascular:  Rate and Rhythm: Normal rate and regular rhythm.     Heart sounds: No murmur heard. Pulmonary:     Effort: Pulmonary effort is normal. No respiratory distress.     Breath sounds: Normal breath sounds.  Abdominal:     Palpations: Abdomen is soft.     Tenderness: There is no abdominal tenderness.  Skin:    General: Skin is warm.     Findings: No erythema or rash.  Neurological:     General: No focal deficit present.     Mental Status: She is alert and oriented to person, place, and time.     Cranial Nerves: No cranial nerve deficit.     Gait: Gait normal.     Comments: Right foot drop, gait assisted with a walker.  Psychiatric:        Mood and Affect: Mood is anxious. Affect is labile.     Comments: Well groomed, good eye contact.   ASSESSMENT AND PLAN:  [name] was seen today for 3 months follow-up.  Diagnoses and all orders for this visit:  Essential hypertension BP adequately controlled. Continue Carvedilol 25 mg bid and Telmisartan-HCTZ 40-12.5 mg daily. Continue monitoring BP and following low salt diet.  Anxiety disorder, unspecified type Worse after the death of her brother. Recommend resuming Alprazolam at 0.5 mg, she can take 1/2-1 tab at bedtime to help her sleep. Cymbalta increased from 30 mg to 60 mg daily. Some side effects discussed.  -      ALPRAZolam (XANAX) 0.5 MG tablet; Take 1/2 to 1 tablet (0.25-0.5 mg total) by mouth at bedtime as needed for anxiety. -     DULoxetine (CYMBALTA) 60 MG capsule; Take 1 capsule (60 mg total) by mouth daily.  Grief CBT may help. Alprazolam 0.5 mg to take 1/2-1 tab daily as needed.  Chest pain, unspecified type Improved. We discussed possible etiologies. Recommend avoiding exertional activities for now. Pending cardiac MRI. Instructed about warning signs. Following with cardiologist.  Return in about 5 months (around 10/03/2021) for cpe.   Hakop Humbarger G. [name], MD  [facility]. [place] office.

## 2021-05-03 NOTE — Patient Instructions (Signed)
Your procedure is scheduled on: 05/13/21 Report to [place]. To find out your arrival time please call [phone] between 1PM - 3PM on 05/10/21.  Remember: Instructions that are not followed completely may result in serious medical risk, up to and including death, or upon the discretion of your surgeon and anesthesiologist your surgery may need to be rescheduled.     _X__ 1. Do not eat food after midnight the night before your procedure.                 No gum chewing or hard candies. You may drink clear liquids up to 2 hours                 before you are scheduled to arrive for your surgery- DO not drink clear                 liquids within 2 hours of the start of your surgery.                 Clear Liquids include:  water, apple juice without pulp, clear carbohydrate                 drink such as Clearfast or Gatorade, Black Coffee or Tea (Do not add                 anything to coffee or tea). Diabetics water only  __X__2.  On the morning of surgery brush your teeth with toothpaste and water, you                 may rinse your mouth with mouthwash if you wish.  Do not swallow any              toothpaste of mouthwash.     _X__ 3.  No Alcohol for 24 hours before or after surgery.   _X__ 4.  Do Not Smoke or use e-cigarettes For 24 Hours Prior to Your Surgery.                 Do not use any chewable tobacco products for at least 6 hours prior to                 surgery.  ____  5.  Bring all medications with you on the day of surgery if instructed.   __X__  6.  Notify your doctor if there is any change in your medical condition      (cold, fever, infections).     Do not wear jewelry, make-up, hairpins, clips or nail polish. Do not wear lotions, powders, or perfumes.  Do not shave 48 hours prior to surgery. Men may shave face and neck. Do not bring valuables to the hospital.    [facility] is not responsible for any belongings or  valuables.  Contacts, dentures/partials or body piercings may not be worn into surgery. Bring a case for your contacts, glasses or hearing aids, a denture cup will be supplied. Leave your suitcase in the car. After surgery it may be brought to your room. For patients admitted to the hospital, discharge time is determined by your treatment team.   Patients discharged the day of surgery will not be allowed to drive home.   Please read over the following fact sheets that you were given:   MRSA Information, chg soap  __X__ Take these medicines the morning of surgery with A SIP OF WATER:  1. carvedilol (COREG) 25 MG tablet  2. DULoxetine (CYMBALTA) 60 MG capsule  3. gabapentin (NEURONTIN) 400 MG capsule  4. ALPRAZolam (XANAX)  tablet if needed  5. baclofen (LIORESAL) 10 MG tablet if needed  6.  ____ Fleet Enema (as directed)   __X__ Use CHG Soap/SAGE wipes as directed  ____ Use inhalers on the day of surgery  ____ Stop metformin/Janumet/Farxiga 2 days prior to surgery    ____ Take 1/2 of usual insulin dose the night before surgery. No insulin the morning          of surgery.   ____ Stop Blood Thinners Coumadin/Plavix/Xarelto/Pleta/Pradaxa/Eliquis/Effient/Aspirin  on   Or contact your Surgeon, Cardiologist or Medical Doctor regarding  ability to stop your blood thinners  __X__ Stop Anti-inflammatories 7 days before surgery such as Advil, Ibuprofen, Motrin,  BC or Goodies Powder, Naprosyn, Naproxen, Aleve, Aspirin    __X__ Stop all herbal supplements, fish oil or vitamin E until after surgery.    ____ Bring C-Pap to the hospital.

## 2021-05-05 LAB — URINE CULTURE: Culture: NO GROWTH

## 2021-05-07 ENCOUNTER — Encounter: Payer: Self-pay | Admitting: Neurosurgery

## 2021-05-07 NOTE — Progress Notes (Signed)
Perioperative Services  Pre-Admission/Anesthesia Testing Clinical Review  Date: 05/07/21  Patient Demographics:  Name: [name] DOB:   [birth year] MRN:   [id number]  Planned Surgical Procedure(s):    Case: [id number] Date/Time: 05/13/21 0700   Procedure: LUMBAR SPINAL CORD STIMULATOR REMOVAL   Anesthesia type: General   Pre-op diagnosis: g89.4 chronic pain syndrom   Location: [facility] OR ROOM 03 / [place] [facility] FOR ANESTHESIA GROUP   Surgeons: Deetta Perla, MD     NOTE: Available PAT nursing documentation and vital signs have been reviewed. Clinical nursing staff has updated patient's PMH/PSHx, current medication list, and drug allergies/intolerances to ensure comprehensive history available to assist in medical decision making as it pertains to the aforementioned surgical procedure and anticipated anesthetic course. Extensive review of available clinical information performed. [name] PMH and PSHx updated with any diagnoses/procedures that  may have been inadvertently omitted during her intake with the pre-admission testing department's nursing staff.  Clinical Discussion:  [name] is a 60 y.o. female who is submitted for pre-surgical anesthesia review and clearance prior to her undergoing the above procedure. Patient has never been a smoker. Pertinent PMH includes: LVH, angina, G1DD, aortic atherosclerosis, HTN, HLD, dyspnea, GERD (no daily Tx), chronic pain syndrome, postlaminectomy syndrome (lumbar), foot drop, anxiety (on BZO).  Patient is followed by cardiology Debara Pickett, MD). She was last seen in the cardiology clinic on 03/27/2021; notes reviewed.  At the time of her clinic visit, patient with complaints of chest discomfort.  Family history significant for hypertrophic cardiomyopathy and MI, thus patient is very concerned.  Patient denied any episodes of significant shortness of breath, PND, orthopnea, palpitations, significant peripheral edema, vertiginous  symptoms, or presyncope/syncope.  Patient has undergone the following cardiovascular testing.  Myocardial perfusion imaging study performed on 04/22/2015 demonstrated a normal left ventricular systolic function with an EF of 63%.  There was a small to moderate anterior apical perfusion defect noted on stress imaging.  Ischemia versus breast attenuation considered.  She underwent diagnostic left heart catheterization on 04/18/2015 revealing no evidence of significant angiographic coronary artery disease.  Left ventricular systolic function was hyperdynamic; LVEDP 18 mmHg.  TTE performed on 12/15/2020 revealed a normal left ventricular systolic function with an EF of 65-70%.  There was severe asymmetric left ventricular hypertrophy of the basal septal segment; IVSd calculated at 1.9 cm with no LVOT obstruction.  Doppler parameters consistent with impaired relaxation (G1DD).   Repeat TTE performed on 01/29/2021 revealed normal left ventricular systolic function with an EF of 60 to 65%.  There were no regional wall motion abnormalities.  Doppler parameters again consistent with impaired relaxation (G1DD).  Interpreting cardiologist did not make note of severe LVH with increased IVSd.  Primary cardiologist did not agree with interpretation, and following independent review of the study, MD had continued concerns regarding potential for hypertrophic cardiomyopathy citing that the IVSd was still 1.9-2.0 cm.  Cardiac MRI recommended, however patient had a spinal cord stimulator in place that was not MRI compatible.  Patient noted that device was not helping, thus she was pursuing removal.   Coronary CTA performed on 04/16/2021 demonstrated no evidence of CAD.  Coronary calcium score was 0.  There was normal coronary origin with right-sided dominance.  Aortic atherosclerosis noted (see full interpretation of cardiovascular testing below).  Patient with uncontrolled hypertension.  Blood pressure documented at  172/108 on beta-blocker monotherapy.  Patient was started on ARB/diuretic combination therapy.  Subsequently documented blood pressure readings on 05/01/2021 was 130/83. Patient  is on a statin for her HLD. Functional capacity, as defined by DASI, is documented as being >/= 4 METS.  No additional changes were made to patient's medication regimen.  Patient to proceed with coronary CTA.  Plans are to consider cardiac MRI once medical stimulator removed. Patient to follow-up with outpatient cardiology in 2 months or sooner if needed.  Patient is scheduled to have her spinal cord stimulator removed on 05/13/2021 with Dr. Deetta Perla, MD.  Given patient's past medical history significant for cardiovascular diagnoses, presurgical cardiac clearance was sought by the performing surgeon's office and PAT team. Per cardiology, "based on patient's past medical history and time since his last clinic visit, patient would be at an overall ACCEPTABLE risk for the planned procedure without further cardiovascular testing or intervention at this time".  This patient is not on any type of anticoagulation or antiplatelet therapies that would need to be held preoperatively.    Patient reports previous perioperative complications with anesthesia in the past. Patient has a PMH (+) for PONV.  Order placed by PAT for patient to receive a prophylactic preoperative dose of aprepitant. In review of the available records, it is noted that patient underwent a MAC anesthetic course at [facility] in 11/2020 without documented complications.   Vitals with BMI 05/03/2021 05/03/2021 05/01/2021  Height _0  _1  _2   Weight 169 lbs 169 lbs 169 lbs  BMI 28.12 06.30 16.01  Systolic 093 235 573  Diastolic 79 70 83  Pulse 72 85 71    Providers/Specialists:   NOTE: Primary physician provider listed below. Patient may have been seen by APP or partner within same practice.   PROVIDER ROLE / SPECIALTY LAST Edsel Petrin,  MD  Neurosurgery 04/02/2021   [name], [name], MD  Primary Care Provider 05/03/2021   Lyman Bishop, MD  Cardiology 03/27/2021   Allergies:  Cephalosporins; Penicillins; Anesthetics, amide; Betadine [povidone iodine]; Latex; Peach [prunus persica]; and Claritin [loratadine]  Current Home Medications:   No current facility-administered medications for this encounter.    atorvastatin (LIPITOR) 20 MG tablet   baclofen (LIORESAL) 10 MG tablet   carvedilol (COREG) 25 MG tablet   cholecalciferol (VITAMIN D3) 25 MCG (1000 UNIT) tablet   diphenhydrAMINE (BENADRYL) 25 MG tablet   Estradiol Acetate (FEMRING) 0.05 MG/24HR RING   gabapentin (NEURONTIN) 400 MG capsule   meclizine (ANTIVERT) 25 MG tablet   methocarbamol (ROBAXIN) 500 MG tablet   telmisartan-hydrochlorothiazide (MICARDIS HCT) 40-12.5 MG tablet   ALPRAZolam (XANAX) 0.5 MG tablet   DULoxetine (CYMBALTA) 60 MG capsule   PFIZER-BIONT COVID-19 VAC-TRIS SUSP injection   traMADol (ULTRAM) 50 MG tablet   History:   Past Medical History:  Diagnosis Date   Anxiety    Aortic atherosclerosis ([place]) 04/16/2021   Atypical angina (HCC)    Back pain    related to spinal stenosis and disc problem, radiates down left buttocks to leg., weakness occ.   Chronic pain syndrome    Dyspnea    GERD (gastroesophageal reflux disease)    Grade I diastolic dysfunction    Headache    Hyperlipidemia    Hypertension    Lumbar post-laminectomy syndrome    LVH (left ventricular hypertrophy) 12/15/2020   severe asymmetric LVH with IVSd 1.9 cm by TTE   PONV (postoperative nausea and vomiting)    Right foot drop    Vaginal foreign object    "Uses Femring"   Past Surgical History:  Procedure Laterality  Date   ABDOMINAL HYSTERECTOMY     BIOPSY  12/16/2020   Procedure: BIOPSY;  Surgeon: Ladene Artist, MD;  Location: [place];  Service: Endoscopy;;   CARDIAC CATHETERIZATION N/A 04/18/2015   Procedure: Left Heart Cath and Coronary Angiography;   Surgeon: [name], MD;  Location: [place] [facility];  Service: Cardiovascular;  Laterality: N/A;   COLONOSCOPY W/ BIOPSIES AND POLYPECTOMY     ESOPHAGOGASTRODUODENOSCOPY (EGD) WITH PROPOFOL N/A 12/16/2020   Procedure: ESOPHAGOGASTRODUODENOSCOPY (EGD) WITH PROPOFOL;  Surgeon: Ladene Artist, MD;  Location: [facility] ENDOSCOPY;  Service: Endoscopy;  Laterality: N/A;   FOOT SURGERY Bilateral    [place] "bunion,bone spur, tendon" (1) -6'16, (1)-10'16   IR EPIDUROGRAPHY  07/21/2018   LUMBAR LAMINECTOMY/DECOMPRESSION MICRODISCECTOMY Bilateral 12/28/2015   Procedure: MICRO LUMBAR DECOMPRESSION L4 - L5 BILATERALLY;  Surgeon: [name], MD;  Location: [facility];  Service: Orthopedics;  Laterality: Bilateral;   LUMBAR LAMINECTOMY/DECOMPRESSION MICRODISCECTOMY Bilateral 03/04/2018   Procedure: Revision of Microlumbar Decompression Bilateral Lumbar Four-Five;  Surgeon: [name], MD;  Location: [place];  Service: Orthopedics;  Laterality: Bilateral;  90 mins   SAVORY DILATION N/A 12/16/2020   Procedure: SAVORY DILATION;  Surgeon: Ladene Artist, MD;  Location: [facility] ENDOSCOPY;  Service: Endoscopy;  Laterality: N/A;   SPINAL CORD STIMULATOR INSERTION N/A 09/28/2019   Procedure: THORACIC SPINAL CORD STIMULATOR INSERTION;  Surgeon: Melina Schools, MD;  Location: [place];  Service: Orthopedics;  Laterality: N/A;  2.5 hrs   TUBAL LIGATION     WISDOM TOOTH EXTRACTION     WOUND EXPLORATION N/A 03/04/2018   Procedure: EXPLORATION OF LUMBAR DECOMPRESSION WOUND;  Surgeon: [name], MD;  Location: [place];  Service: Orthopedics;  Laterality: N/A;   Family History  Problem Relation Age of Onset   Heart attack Mother    Lung cancer Father    Cancer Father    Pancreatic cancer Sister    Breast cancer Sister 72   Throat cancer Brother    Multiple myeloma Sister    Breast cancer Sister        diagnosed in her 29's   Heart attack Sister    Stomach cancer Cousin    Colon cancer Neg Hx    Social History    Tobacco Use   Smoking status: Never   Smokeless tobacco: Never  Vaping Use   Vaping Use: Never used  Substance Use Topics   Alcohol use: No   Drug use: No    Pertinent Clinical Results:  LABS: Labs reviewed: Acceptable for surgery.  No visits with results within 3 Day(s) from this visit.  Latest known visit with results is:  Hospital Outpatient Visit on 05/03/2021  Component Date Value Ref Range Status   Sodium 05/03/2021 136  135 - 145 mmol/L Final   Potassium 05/03/2021 3.7  3.5 - 5.1 mmol/L Final   Chloride 05/03/2021 100  98 - 111 mmol/L Final   CO2 05/03/2021 30  22 - 32 mmol/L Final   Glucose, Bld 05/03/2021 88  70 - 99 mg/dL Final   Glucose reference range applies only to samples taken after fasting for at least 8 hours.   BUN 05/03/2021 11  6 - 20 mg/dL Final   Creatinine, Ser 05/03/2021 0.98  0.44 - 1.00 mg/dL Final   Calcium 05/03/2021 8.9  8.9 - 10.3 mg/dL Final   GFR, Estimated 05/03/2021 >60  >60 mL/min Final   Comment: (NOTE) Calculated using the CKD-EPI Creatinine Equation (2021)    Anion gap  05/03/2021 6  5 - 15 Final   Performed at [facility], [place], [place] [address]   WBC 05/03/2021 4.1  4.0 - 10.5 K/uL Final   RBC 05/03/2021 4.11  3.87 - 5.11 MIL/uL Final   Hemoglobin 05/03/2021 12.3  12.0 - 15.0 g/dL Final   HCT 05/03/2021 36.5  36.0 - 46.0 % Final   MCV 05/03/2021 88.8  80.0 - 100.0 fL Final   MCH 05/03/2021 29.9  26.0 - 34.0 pg Final   MCHC 05/03/2021 33.7  30.0 - 36.0 g/dL Final   RDW 05/03/2021 13.3  11.5 - 15.5 % Final   Platelets 05/03/2021 269  150 - 400 K/uL Final   nRBC 05/03/2021 0.0  0.0 - 0.2 % Final   Performed at [facility], [address] [place], [place] [address]   MRSA, PCR 05/03/2021 NEGATIVE  NEGATIVE Final   Staphylococcus aureus 05/03/2021 NEGATIVE  NEGATIVE Final   Comment: (NOTE) The Xpert SA Assay (FDA approved for NASAL specimens in patients 66 years of age and older), is one  component of a comprehensive surveillance program. It is not intended to diagnose infection nor to guide or monitor treatment. Performed at [facility], [address] [place], [place] [address]    ABO/RH(D) 05/03/2021 B POS   Final   Antibody Screen 05/03/2021 NEG   Final   Sample Expiration 05/03/2021 05/17/2021,2359   Final   Extend sample reason 05/03/2021    Final                   Value:NO TRANSFUSIONS OR PREGNANCY IN THE PAST 3 MONTHS Performed at [facility], [place], [place] [address]    Color, Urine 05/03/2021 YELLOW (A) YELLOW Final   APPearance 05/03/2021 HAZY (A) CLEAR Final   Specific Gravity, Urine 05/03/2021 1.021  1.005 - 1.030 Final   pH 05/03/2021 7.0  5.0 - 8.0 Final   Glucose, UA 05/03/2021 NEGATIVE  NEGATIVE mg/dL Final   Hgb urine dipstick 05/03/2021 NEGATIVE  NEGATIVE Final   Bilirubin Urine 05/03/2021 NEGATIVE  NEGATIVE Final   Ketones, ur 05/03/2021 5 (A) NEGATIVE mg/dL Final   Protein, ur 05/03/2021 NEGATIVE  NEGATIVE mg/dL Final   Nitrite 05/03/2021 NEGATIVE  NEGATIVE Final   Leukocytes,Ua 05/03/2021 NEGATIVE  NEGATIVE Final   WBC, UA 05/03/2021 0-5  0 - 5 WBC/hpf Final   Bacteria, UA 05/03/2021 MANY (A) NONE SEEN Final   Squamous Epithelial / LPF 05/03/2021 0-5  0 - 5 Final   Mucus 05/03/2021 PRESENT   Final   Performed at [facility], [address] [place], [place] [address]    ECG: Date: 03/27/2021 Time ECG obtained: 1058 AM Rate: 88 bpm Rhythm: normal sinus Axis (leads I and aVF): Normal Intervals: PR 184 ms. QRS 82 ms. QTc 469 ms. ST segment and T wave changes: No evidence of acute ST segment elevation or depression Comparison: Similar to previous tracing obtained on 02/07/2021   IMAGING / PROCEDURES: CORONARY CTA on 04/16/2021 No evidence of CAD; CADRADS = 0 Coronary calcium score of 0.  This was 0 percentile for age and sex matched control. Normal coronary origin with right  dominance Aortic atherosclerosis Recommendation: Consider noncardiac causes of chest pain.  TRANSTHORACIC ECHOCARDIOGRAM performed on 01/29/2021 Left ventricular ejection fraction, by estimation, is 60 to 65%. Left ventricular ejection fraction by 3D volume is 67 %. The left ventricle has normal function. The left ventricle has no regional wall  motion abnormalities. Left ventricular diastolic parameters are consistent with Grade I diastolic dysfunction (impaired relaxation).  Right ventricular systolic function is normal. The right ventricular size is normal. There is normal pulmonary artery systolic pressure.  The mitral valve is normal in structure. Mild mitral valve regurgitation. No evidence of mitral stenosis. The aortic valve is normal in structure. Aortic valve regurgitation is not visualized. No aortic stenosis is present.  The inferior vena cava is normal in size with greater than 50% respiratory variability, suggesting right atrial pressure of 3 mmHg.  LONG TERM CARDIAC EVENT MONITOR STUDY on 01/24/2021 Monitoring period was 12/22/2020 through 01/20/2021 Predominantly underlying sinus rhythm with an average heart rate of 85.8 bpm There were 0 critical, 0 serious, and 24 stable events that occurred all while patient is in sinus rhythm  TRANSTHORACIC ECHOCARDIOGRAM performed on 12/15/2020 Left ventricular ejection fraction, by estimation, is 65 to 70%. The left ventricle has normal function. The left ventricle has no regional wall motion abnormalities. There is severe asymmetric left ventricular hypertrophy of the basal-septal segment. The IVSd is 1.9 cm - no LVOT obstruction is noted at rest. Valsalva not performed. Findings suggest possible hypertrophic  cardiomyopathy. Left ventricular diastolic parameters are consistent with Grade I diastolic dysfunction (impaired relaxation).  Right ventricular systolic function is normal. The right ventricular size is normal. There is normal pulmonary  artery systolic pressure.  The aortic valve is tricuspid. Aortic valve regurgitation is not visualized.  The inferior vena cava is normal in size with greater than 50% respiratory variability, suggesting right atrial pressure of 3 mmHg. The mitral valve is grossly normal. Trivial mitral valve regurgitation.  Conclusion(s)/Recommendation(s): Findings consistent with possible hypertrophic cardiomyopathy. Consider cMRI to evaluate further.  LEFT HEART CATHETERIZATION AND CORONARY ANGIOGRAPHY performed on 04/18/2015 No significant coronary artery disease. Left ventricular systolic function hyperdynamic LVEDP elevated 18 mmHg  LEXISCAN performed on 04/17/2015 LVEF 63% Normal left ventricular wall motion Small to moderate anterior apical perfusion defect that is noted to be larger on stress imaging.  Difficult to exclude anterior apical ischemia, however a component of this is also likely secondary to breast attenuation Intermediate risk study  Impression and Plan:  [name] has been referred for pre-anesthesia review and clearance prior to her undergoing the planned anesthetic and procedural courses. Available labs, pertinent testing, and imaging results were personally reviewed by me. This patient has been appropriately cleared by cardiology with an overall ACCEPTABLE risk of significant perioperative cardiovascular complications.  Based on clinical review performed today (05/07/21), barring any significant acute changes in the patient's overall condition, it is anticipated that she will be able to proceed with the planned surgical intervention. Any acute changes in clinical condition may necessitate her procedure being postponed and/or cancelled. Patient will meet with anesthesia team (MD and/or CRNA) on the day of her procedure for preoperative evaluation/assessment. Questions regarding anesthetic course will be fielded at that time.   Pre-surgical instructions were reviewed with  the patient during her PAT appointment and questions were fielded by PAT clinical staff. Patient was advised that if any questions or concerns arise prior to her procedure then she should return a call to PAT and/or her surgeon's office to discuss.  Honor Loh, MSN, APRN, FNP-C, CEN [facility]  Peri-operative Services Nurse Practitioner Phone: [phone] Fax: [phone] 05/07/21 12:06 PM  NOTE: This note has been prepared using Dragon dictation software. Despite my best ability to proofread, there is always the potential that unintentional transcriptional errors may  still occur from this process.

## 2021-05-10 ENCOUNTER — Ambulatory Visit: Payer: PPO

## 2021-05-13 ENCOUNTER — Emergency Department: Payer: PPO

## 2021-05-13 ENCOUNTER — Observation Stay
Admission: RE | Admit: 2021-05-13 | Discharge: 2021-05-14 | Disposition: A | Discharge status: Home / Self Care | Payer: PPO | Attending: Emergency Medicine | Admitting: Emergency Medicine

## 2021-05-13 ENCOUNTER — Other Ambulatory Visit: Payer: Self-pay

## 2021-05-13 ENCOUNTER — Encounter
Admission: RE | Disposition: A | Discharge status: Home / Self Care | Payer: Self-pay | Source: Home / Self Care | Attending: Emergency Medicine

## 2021-05-13 ENCOUNTER — Encounter: Payer: Self-pay | Admitting: Neurosurgery

## 2021-05-13 DIAGNOSIS — R109 Unspecified abdominal pain: Secondary | ICD-10-CM | POA: Diagnosis not present

## 2021-05-13 DIAGNOSIS — R519 Headache, unspecified: Secondary | ICD-10-CM | POA: Diagnosis not present

## 2021-05-13 DIAGNOSIS — Z20822 Contact with and (suspected) exposure to covid-19: Secondary | ICD-10-CM | POA: Diagnosis not present

## 2021-05-13 DIAGNOSIS — R1030 Lower abdominal pain, unspecified: Secondary | ICD-10-CM | POA: Diagnosis present

## 2021-05-13 DIAGNOSIS — I10 Essential (primary) hypertension: Secondary | ICD-10-CM | POA: Diagnosis not present

## 2021-05-13 DIAGNOSIS — R0789 Other chest pain: Secondary | ICD-10-CM | POA: Diagnosis not present

## 2021-05-13 DIAGNOSIS — R531 Weakness: Secondary | ICD-10-CM | POA: Diagnosis not present

## 2021-05-13 DIAGNOSIS — I5032 Chronic diastolic (congestive) heart failure: Secondary | ICD-10-CM | POA: Diagnosis not present

## 2021-05-13 DIAGNOSIS — I110 Hypertensive heart disease with heart failure: Secondary | ICD-10-CM | POA: Diagnosis not present

## 2021-05-13 DIAGNOSIS — R55 Syncope and collapse: Principal | ICD-10-CM | POA: Diagnosis present

## 2021-05-13 DIAGNOSIS — E785 Hyperlipidemia, unspecified: Secondary | ICD-10-CM

## 2021-05-13 DIAGNOSIS — M549 Dorsalgia, unspecified: Secondary | ICD-10-CM | POA: Diagnosis not present

## 2021-05-13 DIAGNOSIS — Z91040 Latex allergy status: Secondary | ICD-10-CM | POA: Insufficient documentation

## 2021-05-13 DIAGNOSIS — R079 Chest pain, unspecified: Secondary | ICD-10-CM | POA: Diagnosis not present

## 2021-05-13 DIAGNOSIS — F418 Other specified anxiety disorders: Secondary | ICD-10-CM | POA: Diagnosis not present

## 2021-05-13 DIAGNOSIS — Z79899 Other long term (current) drug therapy: Secondary | ICD-10-CM | POA: Insufficient documentation

## 2021-05-13 DIAGNOSIS — G8929 Other chronic pain: Secondary | ICD-10-CM | POA: Diagnosis not present

## 2021-05-13 DIAGNOSIS — R4182 Altered mental status, unspecified: Secondary | ICD-10-CM | POA: Diagnosis not present

## 2021-05-13 DIAGNOSIS — J9811 Atelectasis: Secondary | ICD-10-CM | POA: Diagnosis not present

## 2021-05-13 HISTORY — DX: Syncope and collapse: R55

## 2021-05-13 HISTORY — DX: Chronic pain syndrome: G89.4

## 2021-05-13 HISTORY — DX: Other ill-defined heart diseases: I51.89

## 2021-05-13 HISTORY — DX: Chest pain, unspecified: R07.9

## 2021-05-13 HISTORY — DX: Other nonspecific abnormal finding of lung field: R91.8

## 2021-05-13 HISTORY — DX: Gastro-esophageal reflux disease without esophagitis: K21.9

## 2021-05-13 HISTORY — DX: Other forms of angina pectoris: I20.89

## 2021-05-13 HISTORY — DX: Foot drop, right foot: M21.371

## 2021-05-13 HISTORY — DX: Other forms of angina pectoris: I20.8

## 2021-05-13 LAB — URINALYSIS, COMPLETE (UACMP) WITH MICROSCOPIC
Bacteria, UA: NONE SEEN
Bilirubin Urine: NEGATIVE
Glucose, UA: NEGATIVE mg/dL
Hgb urine dipstick: NEGATIVE
Ketones, ur: NEGATIVE mg/dL
Leukocytes,Ua: NEGATIVE
Nitrite: NEGATIVE
Protein, ur: NEGATIVE mg/dL
Specific Gravity, Urine: 1 — ABNORMAL LOW (ref 1.005–1.030)
Squamous Epithelial / HPF: NONE SEEN (ref 0–5)
WBC, UA: NONE SEEN WBC/hpf (ref 0–5)
pH: 6 (ref 5.0–8.0)

## 2021-05-13 LAB — COMPREHENSIVE METABOLIC PANEL
ALT: 17 U/L (ref 0–44)
AST: 20 U/L (ref 15–41)
Albumin: 3.6 g/dL (ref 3.5–5.0)
Alkaline Phosphatase: 52 U/L (ref 38–126)
Anion gap: 8 (ref 5–15)
BUN: 15 mg/dL (ref 6–20)
CO2: 27 mmol/L (ref 22–32)
Calcium: 8.9 mg/dL (ref 8.9–10.3)
Chloride: 101 mmol/L (ref 98–111)
Creatinine, Ser: 0.99 mg/dL (ref 0.44–1.00)
GFR, Estimated: >60 mL/min (ref >60)
Glucose, Bld: 107 mg/dL — ABNORMAL HIGH (ref 70–99)
Potassium: 3.6 mmol/L (ref 3.5–5.1)
Sodium: 136 mmol/L (ref 135–145)
Total Bilirubin: 0.5 mg/dL (ref 0.3–1.2)
Total Protein: 6.9 g/dL (ref 6.5–8.1)

## 2021-05-13 LAB — T4, FREE: Free T4: 0.87 ng/dL (ref 0.61–1.12)

## 2021-05-13 LAB — CBC WITH DIFFERENTIAL/PLATELET
Abs Immature Granulocytes: 0.02 10*3/uL (ref 0.00–0.07)
Basophils Absolute: 0 10*3/uL (ref 0.0–0.1)
Basophils Relative: 1 %
Eosinophils Absolute: 0.1 10*3/uL (ref 0.0–0.5)
Eosinophils Relative: 1 %
HCT: 35.2 % — ABNORMAL LOW (ref 36.0–46.0)
Hemoglobin: 11.8 g/dL — ABNORMAL LOW (ref 12.0–15.0)
Immature Granulocytes: 1 %
Lymphocytes Relative: 43 %
Lymphs Abs: 1.5 10*3/uL (ref 0.7–4.0)
MCH: 29.8 pg (ref 26.0–34.0)
MCHC: 33.5 g/dL (ref 30.0–36.0)
MCV: 88.9 fL (ref 80.0–100.0)
Monocytes Absolute: 0.4 10*3/uL (ref 0.1–1.0)
Monocytes Relative: 12 %
Neutro Abs: 1.5 10*3/uL — ABNORMAL LOW (ref 1.7–7.7)
Neutrophils Relative %: 42 %
Platelets: 257 10*3/uL (ref 150–400)
RBC: 3.96 MIL/uL (ref 3.87–5.11)
RDW: 13.3 % (ref 11.5–15.5)
WBC: 3.5 10*3/uL — ABNORMAL LOW (ref 4.0–10.5)
nRBC: 0 % (ref 0.0–0.2)

## 2021-05-13 LAB — ABO/RH: ABO/RH(D): B POS

## 2021-05-13 LAB — TROPONIN I (HIGH SENSITIVITY)
Troponin I (High Sensitivity): 3 ng/L (ref <18)
Troponin I (High Sensitivity): 3 ng/L (ref <18)

## 2021-05-13 LAB — PROTIME-INR
INR: 1.1 (ref 0.8–1.2)
Prothrombin Time: 13.9 seconds (ref 11.4–15.2)

## 2021-05-13 LAB — URINE DRUG SCREEN, QUALITATIVE (ARMC ONLY)
Amphetamines, Ur Screen: NOT DETECTED
Barbiturates, Ur Screen: NOT DETECTED
Benzodiazepine, Ur Scrn: NOT DETECTED
Cannabinoid 50 Ng, Ur ~~LOC~~: POSITIVE — AB
Cocaine Metabolite,Ur ~~LOC~~: NOT DETECTED
MDMA (Ecstasy)Ur Screen: NOT DETECTED
Methadone Scn, Ur: NOT DETECTED
Opiate, Ur Screen: NOT DETECTED
Phencyclidine (PCP) Ur S: NOT DETECTED
Tricyclic, Ur Screen: POSITIVE — AB

## 2021-05-13 LAB — RESP PANEL BY RT-PCR (FLU A&B, COVID) ARPGX2
Influenza A by PCR: NEGATIVE
Influenza B by PCR: NEGATIVE
SARS Coronavirus 2 by RT PCR: NEGATIVE

## 2021-05-13 LAB — CBG MONITORING, ED: Glucose-Capillary: 114 mg/dL — ABNORMAL HIGH (ref 70–99)

## 2021-05-13 LAB — MAGNESIUM: Magnesium: 1.9 mg/dL (ref 1.7–2.4)

## 2021-05-13 LAB — APTT: aPTT: 36 seconds (ref 24–36)

## 2021-05-13 LAB — BRAIN NATRIURETIC PEPTIDE: B Natriuretic Peptide: 12.1 pg/mL (ref 0.0–100.0)

## 2021-05-13 LAB — LIPASE, BLOOD: Lipase: 44 U/L (ref 11–51)

## 2021-05-13 LAB — TSH: TSH: 3.361 u[IU]/mL (ref 0.350–4.500)

## 2021-05-13 SURGERY — LUMBAR SPINAL CORD STIMULATOR REMOVAL
Anesthesia: General

## 2021-05-13 MED ORDER — CHLORHEXIDINE GLUCONATE 0.12 % MT SOLN
OROMUCOSAL | Status: AC
  Filled 2021-05-13: qty 15

## 2021-05-13 MED ORDER — FAMOTIDINE 20 MG PO TABS: 20.0000 mg | ORAL_TABLET | Freq: Once | ORAL | Status: DC

## 2021-05-13 MED ORDER — METHOCARBAMOL 500 MG PO TABS
500.0000 mg | ORAL_TABLET | Freq: Every evening | ORAL | Status: DC | PRN
  Filled 2021-05-13: qty 1

## 2021-05-13 MED ORDER — HYDRALAZINE HCL 20 MG/ML IJ SOLN: 5.0000 mg | INTRAMUSCULAR | Status: DC | PRN

## 2021-05-13 MED ORDER — LIDOCAINE HCL (PF) 2 % IJ SOLN
INTRAMUSCULAR | Status: AC
  Filled 2021-05-13: qty 5

## 2021-05-13 MED ORDER — VANCOMYCIN HCL IN DEXTROSE 1-5 GM/200ML-% IV SOLN: 1000.0000 mg | Freq: Once | INTRAVENOUS | Status: DC

## 2021-05-13 MED ORDER — ONDANSETRON HCL 4 MG/2ML IJ SOLN: 4.0000 mg | Freq: Three times a day (TID) | INTRAMUSCULAR | Status: DC | PRN

## 2021-05-13 MED ORDER — HEPARIN SODIUM (PORCINE) 5000 UNIT/ML IJ SOLN
5000.0000 [IU] | Freq: Three times a day (TID) | INTRAMUSCULAR | Status: DC
  Administered 2021-05-13 – 2021-05-14 (×2): 5000 [IU] via SUBCUTANEOUS
  Filled 2021-05-13 (×2): qty 1

## 2021-05-13 MED ORDER — HYDROCHLOROTHIAZIDE 12.5 MG PO CAPS
12.5000 mg | ORAL_CAPSULE | Freq: Every day | ORAL | Status: DC
  Administered 2021-05-14: 12.5 mg via ORAL
  Filled 2021-05-13: qty 1

## 2021-05-13 MED ORDER — ONDANSETRON HCL 4 MG/2ML IJ SOLN
INTRAMUSCULAR | Status: AC
  Filled 2021-05-13: qty 2

## 2021-05-13 MED ORDER — DULOXETINE HCL 30 MG PO CPEP
60.0000 mg | ORAL_CAPSULE | Freq: Every day | ORAL | Status: DC
  Administered 2021-05-14: 60 mg via ORAL
  Filled 2021-05-13: qty 2

## 2021-05-13 MED ORDER — CARVEDILOL 25 MG PO TABS
25.0000 mg | ORAL_TABLET | Freq: Two times a day (BID) | ORAL | Status: DC
  Administered 2021-05-14: 25 mg via ORAL
  Filled 2021-05-13: qty 1

## 2021-05-13 MED ORDER — SODIUM CHLORIDE 0.9 % IV BOLUS
500.0000 mL | Freq: Once | INTRAVENOUS | Status: AC
  Administered 2021-05-13: 500 mL via INTRAVENOUS

## 2021-05-13 MED ORDER — ATORVASTATIN CALCIUM 20 MG PO TABS
20.0000 mg | ORAL_TABLET | Freq: Every day | ORAL | Status: DC
  Administered 2021-05-13: 20 mg via ORAL
  Filled 2021-05-13: qty 1

## 2021-05-13 MED ORDER — APREPITANT 40 MG PO CAPS: 40.0000 mg | ORAL_CAPSULE | Freq: Once | ORAL | Status: DC

## 2021-05-13 MED ORDER — THROMBIN 5000 UNITS EX SOLR
CUTANEOUS | Status: AC
  Filled 2021-05-13: qty 5000

## 2021-05-13 MED ORDER — LORAZEPAM 2 MG/ML IJ SOLN
1.0000 mg | Freq: Once | INTRAMUSCULAR | Status: AC
  Administered 2021-05-13: 1 mg via INTRAVENOUS

## 2021-05-13 MED ORDER — ALPRAZOLAM 0.25 MG PO TABS: 0.2500 mg | ORAL_TABLET | Freq: Every evening | ORAL | Status: DC | PRN

## 2021-05-13 MED ORDER — ONDANSETRON HCL 4 MG/2ML IJ SOLN
4.0000 mg | Freq: Once | INTRAMUSCULAR | Status: AC
  Filled 2021-05-13: qty 2

## 2021-05-13 MED ORDER — IOHEXOL 350 MG/ML SOLN
100.0000 mL | Freq: Once | INTRAVENOUS | Status: AC | PRN
  Administered 2021-05-13: 100 mL via INTRAVENOUS

## 2021-05-13 MED ORDER — GABAPENTIN 400 MG PO CAPS
400.0000 mg | ORAL_CAPSULE | Freq: Three times a day (TID) | ORAL | Status: DC
  Administered 2021-05-13 – 2021-05-14 (×2): 400 mg via ORAL
  Filled 2021-05-13 (×2): qty 1

## 2021-05-13 MED ORDER — TELMISARTAN-HCTZ 40-12.5 MG PO TABS: 1.0000 | ORAL_TABLET | Freq: Every day | ORAL | Status: DC

## 2021-05-13 MED ORDER — ACETAMINOPHEN 325 MG PO TABS
650.0000 mg | ORAL_TABLET | Freq: Four times a day (QID) | ORAL | Status: DC | PRN
  Administered 2021-05-14: 650 mg via ORAL
  Filled 2021-05-13: qty 2

## 2021-05-13 MED ORDER — VANCOMYCIN HCL 1000 MG IV SOLR
INTRAVENOUS | Status: AC
  Filled 2021-05-13: qty 1000

## 2021-05-13 MED ORDER — MECLIZINE HCL 25 MG PO TABS
25.0000 mg | ORAL_TABLET | Freq: Every day | ORAL | Status: DC | PRN
  Filled 2021-05-13: qty 1

## 2021-05-13 MED ORDER — ORAL CARE MOUTH RINSE: 15.0000 mL | Freq: Once | OROMUCOSAL | Status: DC

## 2021-05-13 MED ORDER — SODIUM CHLORIDE 0.9 % IV SOLN: INTRAVENOUS | Status: DC

## 2021-05-13 MED ORDER — FAMOTIDINE 20 MG PO TABS
ORAL_TABLET | ORAL | Status: AC
  Filled 2021-05-13: qty 1

## 2021-05-13 MED ORDER — LORAZEPAM 2 MG/ML IJ SOLN
INTRAMUSCULAR | Status: AC
  Filled 2021-05-13: qty 1

## 2021-05-13 MED ORDER — BUPIVACAINE-EPINEPHRINE (PF) 0.5% -1:200000 IJ SOLN
INTRAMUSCULAR | Status: AC
  Filled 2021-05-13: qty 30

## 2021-05-13 MED ORDER — VITAMIN D 25 MCG (1000 UNIT) PO TABS
1000.0000 [IU] | ORAL_TABLET | Freq: Every day | ORAL | Status: DC
  Administered 2021-05-14: 1000 [IU] via ORAL
  Filled 2021-05-13: qty 1

## 2021-05-13 MED ORDER — BACLOFEN 10 MG PO TABS
10.0000 mg | ORAL_TABLET | Freq: Every day | ORAL | Status: DC | PRN
  Filled 2021-05-13: qty 1

## 2021-05-13 MED ORDER — APREPITANT 40 MG PO CAPS
ORAL_CAPSULE | ORAL | Status: AC
  Filled 2021-05-13: qty 1

## 2021-05-13 MED ORDER — ONDANSETRON HCL 4 MG/2ML IJ SOLN
INTRAMUSCULAR | Status: AC
  Administered 2021-05-13: 4 mg via INTRAVENOUS
  Filled 2021-05-13: qty 2

## 2021-05-13 MED ORDER — LACTATED RINGERS IV SOLN: INTRAVENOUS | Status: DC

## 2021-05-13 MED ORDER — ASPIRIN EC 81 MG PO TBEC: 81.0000 mg | DELAYED_RELEASE_TABLET | Freq: Every day | ORAL | Status: DC

## 2021-05-13 MED ORDER — PROPOFOL 10 MG/ML IV BOLUS
INTRAVENOUS | Status: AC
  Filled 2021-05-13: qty 40

## 2021-05-13 MED ORDER — ROCURONIUM BROMIDE 10 MG/ML (PF) SYRINGE
PREFILLED_SYRINGE | INTRAVENOUS | Status: AC
  Filled 2021-05-13: qty 10

## 2021-05-13 MED ORDER — TRAMADOL HCL 50 MG PO TABS: 50.0000 mg | ORAL_TABLET | Freq: Four times a day (QID) | ORAL | Status: DC | PRN

## 2021-05-13 MED ORDER — TRAZODONE HCL 50 MG PO TABS
25.0000 mg | ORAL_TABLET | Freq: Every evening | ORAL | Status: DC | PRN
  Administered 2021-05-13: 25 mg via ORAL
  Filled 2021-05-13: qty 1

## 2021-05-13 MED ORDER — MIDAZOLAM HCL 2 MG/2ML IJ SOLN
INTRAMUSCULAR | Status: AC
  Filled 2021-05-13: qty 2

## 2021-05-13 MED ORDER — DEXAMETHASONE SODIUM PHOSPHATE 10 MG/ML IJ SOLN
INTRAMUSCULAR | Status: AC
  Filled 2021-05-13: qty 1

## 2021-05-13 MED ORDER — IRBESARTAN 150 MG PO TABS
150.0000 mg | ORAL_TABLET | Freq: Every day | ORAL | Status: DC
  Administered 2021-05-14: 150 mg via ORAL
  Filled 2021-05-13: qty 1

## 2021-05-13 MED ORDER — FENTANYL CITRATE (PF) 100 MCG/2ML IJ SOLN
INTRAMUSCULAR | Status: AC
  Filled 2021-05-13: qty 2

## 2021-05-13 MED ORDER — DIPHENHYDRAMINE HCL 25 MG PO CAPS: 25.0000 mg | ORAL_CAPSULE | Freq: Every day | ORAL | Status: DC | PRN

## 2021-05-13 MED ORDER — PHENYLEPHRINE HCL (PRESSORS) 10 MG/ML IV SOLN
INTRAVENOUS | Status: AC
  Filled 2021-05-13: qty 1

## 2021-05-13 MED ORDER — CHLORHEXIDINE GLUCONATE 0.12 % MT SOLN: 15.0000 mL | Freq: Once | OROMUCOSAL | Status: DC

## 2021-05-13 SURGICAL SUPPLY — 67 items
ADH SKN CLS APL DERMABOND .7 (GAUZE/BANDAGES/DRESSINGS) ×1
AGENT HMST MTR 8 SURGIFLO (HEMOSTASIS)
APL PRP STRL LF DISP 70% ISPRP (MISCELLANEOUS) ×1
APL SRG 60D 8 XTD TIP BNDBL (TIP)
BLADE BOVIE TIP EXT 4 (BLADE) ×3 IMPLANT
BUR NEURO DRILL SOFT 3.0X3.8M (BURR) ×3 IMPLANT
CANISTER SUCT 1200ML W/VALVE (MISCELLANEOUS) ×6 IMPLANT
CHLORAPREP W/TINT 26 (MISCELLANEOUS) ×3 IMPLANT
CNTNR SPEC 2.5X3XGRAD LEK (MISCELLANEOUS) ×1
CONT SPEC 4OZ STER OR WHT (MISCELLANEOUS) ×1
CONT SPEC 4OZ STRL OR WHT (MISCELLANEOUS) ×1
CONTAINER SPEC 2.5X3XGRAD LEK (MISCELLANEOUS) ×2 IMPLANT
COUNTER NEEDLE 20/40 LG (NEEDLE) ×3 IMPLANT
COVER LIGHT HANDLE STERIS (MISCELLANEOUS) ×6 IMPLANT
CUP MEDICINE 2OZ PLAST GRAD ST (MISCELLANEOUS) ×3 IMPLANT
DERMABOND ADVANCED (GAUZE/BANDAGES/DRESSINGS) ×1
DERMABOND ADVANCED .7 DNX12 (GAUZE/BANDAGES/DRESSINGS) ×2 IMPLANT
DRAPE C-ARM XRAY 36X54 (DRAPES) ×6 IMPLANT
DRAPE LAPAROTOMY 100X77 ABD (DRAPES) ×3 IMPLANT
DRAPE MICROSCOPE SPINE 48X150 (DRAPES) IMPLANT
DRAPE SURG 17X11 SM STRL (DRAPES) ×3 IMPLANT
DURASEAL APPLICATOR TIP (TIP) IMPLANT
DURASEAL SPINE SEALANT 3ML (MISCELLANEOUS) IMPLANT
ELECT CAUTERY BLADE TIP 2.5 (TIP) ×2
ELECT EZSTD 165MM 6.5IN (MISCELLANEOUS) ×2
ELECT REM PT RETURN 9FT ADLT (ELECTROSURGICAL) ×2
ELECTRODE CAUTERY BLDE TIP 2.5 (TIP) ×2 IMPLANT
ELECTRODE EZSTD 165MM 6.5IN (MISCELLANEOUS) ×2 IMPLANT
ELECTRODE REM PT RTRN 9FT ADLT (ELECTROSURGICAL) ×2 IMPLANT
GAUZE 4X4 16PLY ~~LOC~~+RFID DBL (SPONGE) ×3 IMPLANT
GAUZE SPONGE 4X4 12PLY STRL (GAUZE/BANDAGES/DRESSINGS) IMPLANT
GLOVE SRG 8 PF TXTR STRL LF DI (GLOVE) ×2 IMPLANT
GLOVE SURG SYN 7.0 (GLOVE) ×4 IMPLANT
GLOVE SURG SYN 7.0 PF PI (GLOVE) ×2 IMPLANT
GLOVE SURG SYN 8.0 (GLOVE) ×2 IMPLANT
GLOVE SURG SYN 8.0 PF PI (GLOVE) ×1 IMPLANT
GLOVE SURG UNDER POLY LF SZ7 (GLOVE) ×3 IMPLANT
GLOVE SURG UNDER POLY LF SZ8 (GLOVE) ×2
GOWN STRL REUS W/ TWL LRG LVL3 (GOWN DISPOSABLE) ×2 IMPLANT
GOWN STRL REUS W/ TWL XL LVL3 (GOWN DISPOSABLE) ×4 IMPLANT
GOWN STRL REUS W/TWL LRG LVL3 (GOWN DISPOSABLE) ×2
GOWN STRL REUS W/TWL XL LVL3 (GOWN DISPOSABLE) ×4
GRADUATE 1200CC STRL 31836 (MISCELLANEOUS) ×3 IMPLANT
KIT TURNOVER KIT A (KITS) ×3 IMPLANT
KIT WILSON FRAME (KITS) ×3 IMPLANT
MANIFOLD NEPTUNE II (INSTRUMENTS) ×3 IMPLANT
MARKER SKIN DUAL TIP RULER LAB (MISCELLANEOUS) ×3 IMPLANT
NDL SAFETY ECLIPSE 18X1.5 (NEEDLE) ×2 IMPLANT
NEEDLE HYPO 18GX1.5 SHARP (NEEDLE) ×2
NEEDLE HYPO 22GX1.5 SAFETY (NEEDLE) ×3 IMPLANT
NS IRRIG 1000ML POUR BTL (IV SOLUTION) ×3 IMPLANT
PACK LAMINECTOMY NEURO (CUSTOM PROCEDURE TRAY) ×3 IMPLANT
PAD ARMBOARD 7.5X6 YLW CONV (MISCELLANEOUS) ×3 IMPLANT
SPOGE SURGIFLO 8M (HEMOSTASIS)
SPONGE SURGIFLO 8M (HEMOSTASIS) IMPLANT
STAPLER SKIN PROX 35W (STAPLE) IMPLANT
SUT ETHILON 3-0 FS-10 30 BLK (SUTURE) ×4
SUT POLYSORB 2-0 5X18 GS-10 (SUTURE) ×6 IMPLANT
SUT VIC AB 0 CT1 18XCR BRD 8 (SUTURE) ×4 IMPLANT
SUT VIC AB 0 CT1 8-18 (SUTURE) ×4
SUTURE EHLN 3-0 FS-10 30 BLK (SUTURE) ×4 IMPLANT
SYR 10ML LL (SYRINGE) ×6 IMPLANT
SYR 20ML LL LF (SYRINGE) ×3 IMPLANT
SYR 30ML LL (SYRINGE) ×6 IMPLANT
SYR 3ML LL SCALE MARK (SYRINGE) ×3 IMPLANT
TOWEL OR 17X26 4PK STRL BLUE (TOWEL DISPOSABLE) ×6 IMPLANT
TUBING CONNECTING 10 (TUBING) ×3 IMPLANT

## 2021-05-13 NOTE — ED Notes (Signed)
Called CT at this time to take pt to scan.

## 2021-05-13 NOTE — ED Notes (Signed)
Second visitor at bedside (Son)

## 2021-05-13 NOTE — ED Notes (Signed)
This RN at bedside. Pt stating she is starting to feel nausea and feels more SOB. MD Jari Pigg made aware. No new orders at time.

## 2021-05-13 NOTE — Progress Notes (Signed)
Patient seen in PACU, she is having weakness, headache, and chest pain. Given these factors, she does need a medical evaluation and I do not think it is safe to proceed with an elective surgery. We have recommended referral to ED and she will be transported there for evaluation

## 2021-05-13 NOTE — Progress Notes (Signed)
PHARMACY -  BRIEF ANTIBIOTIC NOTE   Pharmacy has received consult(s) for Vancomycin from an OR provider.  The patient's profile has been reviewed for ht/wt/allergies/indication/available labs.    One time order placed for Vancomycin 1 gm per pt wt: 76.7 kg  Further antibiotics/pharmacy consults should be ordered by admitting physician if indicated.                       Thank you, Renda Rolls, PharmD, [facility] 05/13/2021 6:14 AM

## 2021-05-13 NOTE — H&P (Addendum)
History and Physical    [name] IXZ:[id number] DOB: 11/10/1960 DOA: 05/13/2021  Referring MD/NP/PA:   PCP: [place], [name], MD   Patient coming from:  The patient is coming from home.  At baseline, pt is independent for most of ADL.        Chief Complaint: syncope  HPI: [name] is a 60 y.o. female with medical history significant of possible hypertrophic cardiomyopathy, HTN, HLD, dCHF, chronic back pain, BPPV, depression with anxiety, right foot drop, who presents with syncope.   Pt has chronic back pain with spinal stimulator placement. She was found to have possible hypertrophic cardiomyopathy.  She is scheduled for removal of spinal stimulator in order to do cardiac MRI today, but patient she had 1 episode of syncope before the procedure.  The procedure is canceled by Dr. Adriana Simas of neurosurgery.  Patient states that she has chronic intermittent right arm tingling which has not changed today.  She has right foot drop which is chronic issue.  No new weakness or numbness in the extremities.  No facial droop or slurred speech.  She also reports chest tightness.  She had mild shortness breath earlier, which has resolved.  Currently no shortness of breath, cough, fever or chills.  Patient has nausea, no vomiting, diarrhea.  She complains of mild lower abdominal pain.  Denies symptoms of UTI.  She states she has minimal back pain currently. Pt has HA.  ED Course: pt was found to have troponin level 3 -->3, BNP 12.1, negative COVID PCR, electrolytes renal function okay, temperature normal, blood pressure 160/97, heart rate 73, RR 21, oxygen saturation 97% on room air.  Chest x-ray negative.  CT of the head is negative for acute intracranial abnormalities.  CTA of the chest is negative for PE.  CT of abdomen/pelvis negative for acute intra-abdominal issues.  Patient is placed on progressive bed of observation.  Dr. Mariah Milling of cardiology and Dr. Adriana Simas of neurosurgery are  consulted.  Review of Systems:   General: no fevers, chills, no body weight gain, has fatigue HEENT: no blurry vision, hearing changes or sore throat Respiratory: had dyspnea, no coughing, wheezing CV: has chest tightness, no palpitations GI: has nausea, vomiting, has lower abdominal pain, no diarrhea, constipation GU: no dysuria, burning on urination, increased urinary frequency, hematuria  Ext: no leg edema Neuro: no unilateral weakness, numbness, or tingling, no vision change or hearing loss. Has syncope. Has right foot drop. Has chronic right arm tingling. Skin: no rash, no skin tear. MSK: No muscle spasm, no deformity, no limitation of range of movement in spin Heme: No easy bruising.  Travel history: No recent long distant travel.  Allergy:  Allergies  Allergen Reactions   Cephalosporins Anaphylaxis   Penicillins Anaphylaxis and Hives    Did it involve swelling of the face/tongue/throat, SOB, or low BP? Yes Did it involve sudden or severe rash/hives, skin peeling, or any reaction on the inside of your mouth or nose? Yes Did you need to seek medical attention at a hospital or doctor's office? Yes When did it last happen?      10+ years If all above answers are "NO", may proceed with cephalosporin use.     Anesthetics, Amide Nausea And Vomiting    Does not know name of it. States they put it on record foot center.    Betadine [Povidone Iodine] Other (See Comments)    "Skin Burn" caused scar on Left buttock   Latex Hives, Itching and  Rash   Peach [Prunus Persica] Hives   Claritin [Loratadine]     Rash, pruritis     Past Medical History:  Diagnosis Date   Anxiety    Aortic atherosclerosis ([place]) 04/16/2021   Atypical angina (HCC)    Back pain    related to spinal stenosis and disc problem, radiates down left buttocks to leg., weakness occ.   Chest pain    a. 03/2015 Cath: nl cors; b. 03/2021 Cor CTA: Ca2+ = 0. Nl Cors.   Chronic pain syndrome    Dyspnea    GERD  (gastroesophageal reflux disease)    Grade I diastolic dysfunction    Headache    Hyperlipidemia    Hypertension    Lumbar post-laminectomy syndrome    LVH (left ventricular hypertrophy) 12/15/2020   severe asymmetric LVH with IVSd 1.9 cm by TTE   PONV (postoperative nausea and vomiting)    Pulmonary nodules    a. 03/2021 CT Chest: 109mm pulm nodules in bilat lower lobes. F/u 1 yr.   Right foot drop    Vaginal foreign object    "Uses Femring"    Past Surgical History:  Procedure Laterality Date   ABDOMINAL HYSTERECTOMY     BIOPSY  12/16/2020   Procedure: BIOPSY;  Surgeon: Ladene Artist, MD;  Location: [place];  Service: Endoscopy;;   CARDIAC CATHETERIZATION N/A 04/18/2015   Procedure: Left Heart Cath and Coronary Angiography;  Surgeon: [name], MD;  Location: [place] [facility];  Service: Cardiovascular;  Laterality: N/A;   COLONOSCOPY W/ BIOPSIES AND POLYPECTOMY     ESOPHAGOGASTRODUODENOSCOPY (EGD) WITH PROPOFOL N/A 12/16/2020   Procedure: ESOPHAGOGASTRODUODENOSCOPY (EGD) WITH PROPOFOL;  Surgeon: Ladene Artist, MD;  Location: [facility];  Service: Endoscopy;  Laterality: N/A;   FOOT SURGERY Bilateral    [place] "bunion,bone spur, tendon" (1) -6'16, (1)-10'16   IR EPIDUROGRAPHY  07/21/2018   LUMBAR LAMINECTOMY/DECOMPRESSION MICRODISCECTOMY Bilateral 12/28/2015   Procedure: MICRO LUMBAR DECOMPRESSION L4 - L5 BILATERALLY;  Surgeon: [name], MD;  Location: [facility];  Service: Orthopedics;  Laterality: Bilateral;   LUMBAR LAMINECTOMY/DECOMPRESSION MICRODISCECTOMY Bilateral 03/04/2018   Procedure: Revision of Microlumbar Decompression Bilateral Lumbar Four-Five;  Surgeon: [name], MD;  Location: [place];  Service: Orthopedics;  Laterality: Bilateral;  90 mins   SAVORY DILATION N/A 12/16/2020   Procedure: SAVORY DILATION;  Surgeon: Ladene Artist, MD;  Location: [facility] ENDOSCOPY;  Service: Endoscopy;  Laterality: N/A;   SPINAL CORD STIMULATOR INSERTION N/A  09/28/2019   Procedure: THORACIC SPINAL CORD STIMULATOR INSERTION;  Surgeon: Melina Schools, MD;  Location: [place];  Service: Orthopedics;  Laterality: N/A;  2.5 hrs   TUBAL LIGATION     WISDOM TOOTH EXTRACTION     WOUND EXPLORATION N/A 03/04/2018   Procedure: EXPLORATION OF LUMBAR DECOMPRESSION WOUND;  Surgeon: [name], MD;  Location: [place];  Service: Orthopedics;  Laterality: N/A;    Social History:  reports that she has never smoked. She has never used smokeless tobacco. She reports that she does not drink alcohol and does not use drugs.  Family History:  Family History  Problem Relation Age of Onset   Heart attack Mother    Lung cancer Father    Cancer Father    Pancreatic cancer Sister    Breast cancer Sister 41   Throat cancer Brother    Multiple myeloma Sister    Breast cancer Sister        diagnosed in her 19's   Heart attack  Sister    Stomach cancer Cousin    Colon cancer Neg Hx      Prior to Admission medications   Medication Sig Start Date End Date Taking? Authorizing Provider  atorvastatin (LIPITOR) 20 MG tablet TAKE 1 TABLET BY MOUTH ONCE DAILY Patient taking differently: Take 20 mg by mouth daily. 04/22/20 05/22/21 Yes [name], [name], MD  baclofen (LIORESAL) 10 MG tablet TAKE 1 TABLET BY MOUTH THREE TIMES DAILY AS NEEDED Patient taking differently: Take 10 mg by mouth daily as needed (Nerves). 01/17/21 01/17/22 Yes Suella Broad, MD  carvedilol (COREG) 25 MG tablet Take 1 tablet (25 mg total) by mouth 2 (two) times daily with a meal. 02/07/21 02/07/22 Yes Hilty, Nadean Corwin, MD  cholecalciferol (VITAMIN D3) 25 MCG (1000 UNIT) tablet Take 1,000 Units by mouth daily.   Yes [provider]  diphenhydrAMINE (BENADRYL) 25 MG tablet Take 25 mg by mouth daily as needed for itching.   Yes [provider]  Estradiol Acetate (FEMRING) 0.05 MG/24HR RING USE ONE RING VAGINALLY EVERY 3 MONTHS AS DIRECTED Patient taking differently: every 3 (three) months. 02/21/21   Yes   gabapentin (NEURONTIN) 400 MG capsule TAKE 1 CAPSULE BY MOUTH 3 TIMES DAILY Patient taking differently: Take 400 mg by mouth 3 (three) times daily. 02/08/21  Yes   meclizine (ANTIVERT) 25 MG tablet TAKE 1 TABLET BY MOUTH 2 TIMES DAILY AS NEEDED FOR DIZZINESS Patient taking differently: Take 25 mg by mouth daily as needed for dizziness. 12/20/20 12/20/21 Yes Hall, Carole N, DO  methocarbamol (ROBAXIN) 500 MG tablet TAKE 1 TABLET BY MOUTH AT BEDTIME AS NEEDED Patient taking differently: Take 500 mg by mouth at bedtime as needed for muscle spasms. 12/27/20 12/27/21 Yes Levy Pupa, PA-C  telmisartan-hydrochlorothiazide (MICARDIS HCT) 40-12.5 MG tablet Take 1 tablet by mouth daily. 03/27/21  Yes Hilty, Nadean Corwin, MD  ALPRAZolam Duanne Moron) 0.5 MG tablet Take 1/2 to 1 tablet (0.25-0.5 mg total) by mouth at bedtime as needed for anxiety. 05/03/21   [name], [name], MD  DULoxetine (CYMBALTA) 60 MG capsule Take 1 capsule (60 mg total) by mouth daily. 05/03/21   [name], [name], MD  PFIZER-BIONT COVID-19 VAC-TRIS SUSP injection  02/15/21   [provider]  traMADol (ULTRAM) 50 MG tablet TAKE 1 TABLET BY MOUTH 3 TIMES DAILY AS NEEDED FOR PAIN 05/01/21 10/28/21  Meredith Staggers, MD    Physical Exam: Vitals:   05/13/21 1048 05/13/21 1100 05/13/21 1130 05/13/21 1230  BP: 113/71 110/70 106/66 (!) 141/69  Pulse: (!) 56 (!) 56 (!) 57 73  Resp: $Remo'13 13 17 16  'vMJsG$ Temp:      TempSrc:      SpO2: 97% 97% 97% 99%  Weight:      Height:       General: Not in acute distress HEENT:       Eyes: PERRL, EOMI, no scleral icterus.       ENT: No discharge from the ears and nose, no pharynx injection, no tonsillar enlargement.        Neck: No JVD, no bruit, no mass felt. Heme: No neck lymph node enlargement. Cardiac: S1/S2, RRR, No murmurs, No gallops or rubs. Respiratory: No rales, wheezing, rhonchi or rubs. GI: Soft, nondistended, has mild tenderness in lower abdomen, no rebound pain, no organomegaly, BS present. GU:  No hematuria Ext: No pitting leg edema bilaterally. 1+DP/PT pulse bilaterally. Musculoskeletal: No joint deformities, No joint redness or warmth, no limitation of ROM in spin. Skin: No  rashes.  Neuro: Alert, oriented X3, cranial nerves II-XII grossly intact, moves all extremities normally. Has right foot drop.  Psych: Patient is not psychotic, no suicidal or hemocidal ideation.  Labs on Admission: I have personally reviewed following labs and imaging studies  CBC: Recent Labs  Lab 05/13/21 0642  WBC 3.5*  NEUTROABS 1.5*  HGB 11.8*  HCT 35.2*  MCV 88.9  PLT 009   Basic Metabolic Panel: Recent Labs  Lab 05/13/21 0642  NA 136  K 3.6  CL 101  CO2 27  GLUCOSE 107*  BUN 15  CREATININE 0.99  CALCIUM 8.9  MG 1.9   GFR: Estimated Creatinine Clearance: 62.7 mL/min (by C-G formula based on SCr of 0.99 mg/dL). Liver Function Tests: Recent Labs  Lab 05/13/21 0642  AST 20  ALT 17  ALKPHOS 52  BILITOT 0.5  PROT 6.9  ALBUMIN 3.6   No results for input(s): LIPASE, AMYLASE in the last 168 hours. No results for input(s): AMMONIA in the last 168 hours. Coagulation Profile: No results for input(s): INR, PROTIME in the last 168 hours. Cardiac Enzymes: No results for input(s): CKTOTAL, CKMB, CKMBINDEX, TROPONINI in the last 168 hours. BNP (last 3 results) No results for input(s): PROBNP in the last 8760 hours. HbA1C: No results for input(s): HGBA1C in the last 72 hours. CBG: Recent Labs  Lab 05/13/21 0707  GLUCAP 114*   Lipid Profile: No results for input(s): CHOL, HDL, LDLCALC, TRIG, CHOLHDL, LDLDIRECT in the last 72 hours. Thyroid Function Tests: Recent Labs    05/13/21 0642  TSH 3.361  FREET4 0.87   Anemia Panel: No results for input(s): VITAMINB12, FOLATE, FERRITIN, TIBC, IRON, RETICCTPCT in the last 72 hours. Urine analysis:    Component Value Date/Time   COLORURINE YELLOW (A) 05/03/2021 1246   APPEARANCEUR HAZY (A) 05/03/2021 1246   LABSPEC 1.021  05/03/2021 1246   PHURINE 7.0 05/03/2021 1246   GLUCOSEU NEGATIVE 05/03/2021 1246   HGBUR NEGATIVE 05/03/2021 1246   BILIRUBINUR NEGATIVE 05/03/2021 1246   KETONESUR 5 (A) 05/03/2021 1246   PROTEINUR NEGATIVE 05/03/2021 1246   UROBILINOGEN 0.2 04/24/2009 1600   NITRITE NEGATIVE 05/03/2021 1246   LEUKOCYTESUR NEGATIVE 05/03/2021 1246   Sepsis Labs: $RemoveBefo'@LABRCNTIP'lgVWZCOoIpW$ (procalcitonin:4,lacticidven:4) ) Recent Results (from the past 240 hour(s))  Resp Panel by RT-PCR (Flu A&B, Covid) Nasopharyngeal Swab     Status: None   Collection Time: 05/13/21  7:55 AM   Specimen: Nasopharyngeal Swab; Nasopharyngeal(NP) swabs in vial transport medium  Result Value Ref Range Status   SARS Coronavirus 2 by RT PCR NEGATIVE NEGATIVE Final    Comment: (NOTE) SARS-CoV-2 target nucleic acids are NOT DETECTED.  The SARS-CoV-2 RNA is generally detectable in upper respiratory specimens during the acute phase of infection. The lowest concentration of SARS-CoV-2 viral copies this assay can detect is 138 copies/mL. A negative result does not preclude SARS-Cov-2 infection and should not be used as the sole basis for treatment or other patient management decisions. A negative result may occur with  improper specimen collection/handling, submission of specimen other than nasopharyngeal swab, presence of viral mutation(s) within the areas targeted by this assay, and inadequate number of viral copies(<138 copies/mL). A negative result must be combined with clinical observations, patient history, and epidemiological information. The expected result is Negative.  Fact Sheet for Patients:  EntrepreneurPulse.com.au  Fact Sheet for Healthcare Providers:  IncredibleEmployment.be  This test is no t yet approved or cleared by the [place] FDA and  has been authorized for detection and/or diagnosis  of SARS-CoV-2 by FDA under an Emergency Use Authorization (EUA). This EUA will remain   in effect (meaning this test can be used) for the duration of the COVID-19 declaration under Section 564(b)(1) of the Act, 21 U.S.C.section 360bbb-3(b)(1), unless the authorization is terminated  or revoked sooner.       Influenza A by PCR NEGATIVE NEGATIVE Final   Influenza B by PCR NEGATIVE NEGATIVE Final    Comment: (NOTE) The Xpert Xpress SARS-CoV-2/FLU/RSV plus assay is intended as an aid in the diagnosis of influenza from Nasopharyngeal swab specimens and should not be used as a sole basis for treatment. Nasal washings and aspirates are unacceptable for Xpert Xpress SARS-CoV-2/FLU/RSV testing.  Fact Sheet for Patients: EntrepreneurPulse.com.au  Fact Sheet for Healthcare Providers: IncredibleEmployment.be  This test is not yet approved or cleared by the [place] FDA and has been authorized for detection and/or diagnosis of SARS-CoV-2 by FDA under an Emergency Use Authorization (EUA). This EUA will remain in effect (meaning this test can be used) for the duration of the COVID-19 declaration under Section 564(b)(1) of the Act, 21 U.S.C. section 360bbb-3(b)(1), unless the authorization is terminated or revoked.  Performed at [facility], [address] [place], [place] [address]      Radiological Exams on Admission: CT Head Wo Contrast  Result Date: 05/13/2021 CLINICAL DATA:  Altered mental status and headaches, initial encounter EXAM: CT HEAD WITHOUT CONTRAST TECHNIQUE: Contiguous axial images were obtained from the base of the skull through the vertex without intravenous contrast. COMPARISON:  12/14/2020 FINDINGS: Brain: No evidence of acute infarction, hemorrhage, hydrocephalus, extra-axial collection or mass lesion/mass effect. Vascular: No hyperdense vessel or unexpected calcification. Skull: Normal. Negative for fracture or focal lesion. Sinuses/Orbits: No acute finding. Other: None. IMPRESSION: No acute intracranial  abnormality noted. Electronically Signed   By: Inez Catalina M.D.   On: 05/13/2021 10:54   CT Angio Chest PE W and/or Wo Contrast  Result Date: 05/13/2021 CLINICAL DATA:  Weakness, headache, chest pain, abdominal distension EXAM: CT ANGIOGRAPHY CHEST CT ABDOMEN AND PELVIS WITH CONTRAST TECHNIQUE: Multidetector CT imaging of the chest was performed using the standard protocol during bolus administration of intravenous contrast. Multiplanar CT image reconstructions and MIPs were obtained to evaluate the vascular anatomy. Multidetector CT imaging of the abdomen and pelvis was performed using the standard protocol during bolus administration of intravenous contrast. CONTRAST:  132mL OMNIPAQUE IOHEXOL 350 MG/ML SOLN IV. No oral contrast. COMPARISON:  CT abdomen and pelvis 03/10/2002, CT angio chest 04/24/2009 FINDINGS: CTA CHEST FINDINGS Cardiovascular: Aorta normal caliber without aneurysm or dissection. Heart unremarkable. No pericardial effusion. Pulmonary arteries adequately opacified and patent. No evidence of pulmonary embolism. Mediastinum/Nodes: Esophagus unremarkable. Base of cervical region normal appearance. No thoracic adenopathy. Lungs/Pleura: Dependent atelectasis in the posterior lower lobes slightly greater on RIGHT. Lungs otherwise clear. No pulmonary infiltrate, pleural effusion, or pneumothorax. Musculoskeletal: Unremarkable Review of the MIP images confirms the above findings. CT ABDOMEN and PELVIS FINDINGS Hepatobiliary: Gallbladder and liver normal appearance Pancreas: Normal appearance Spleen: Normal appearance Adrenals/Urinary Tract: Adrenal glands, kidneys, ureters, and bladder normal appearance Stomach/Bowel: Normal appendix. Large and small bowel loops unremarkable. Stomach decompressed, with suboptimally assessed wall thickness Vascular/Lymphatic: Atherosclerotic calcifications aorta and iliac arteries without aneurysm. Vascular structures patent. No adenopathy. Reproductive: Pessary in  vagina. Uterus surgically absent. Nonvisualization of ovaries. Other: No free air or free fluid. No hernia or inflammatory process. Musculoskeletal: Intraspinal stimulator at caudal aspect of thoracic spine. Scattered Schmorl's nodes. No acute osseous findings. Review  of the MIP images confirms the above findings. IMPRESSION: No evidence of pulmonary embolism. Dependent atelectasis in the posterior lower lobes slightly greater on RIGHT. No acute intra-abdominal or intrapelvic abnormalities. Aortic Atherosclerosis (ICD10-I70.0). Electronically Signed   By: Lavonia Dana M.D.   On: 05/13/2021 11:08   CT ABDOMEN PELVIS W CONTRAST  Result Date: 05/13/2021 CLINICAL DATA:  Weakness, headache, chest pain, abdominal distension EXAM: CT ANGIOGRAPHY CHEST CT ABDOMEN AND PELVIS WITH CONTRAST TECHNIQUE: Multidetector CT imaging of the chest was performed using the standard protocol during bolus administration of intravenous contrast. Multiplanar CT image reconstructions and MIPs were obtained to evaluate the vascular anatomy. Multidetector CT imaging of the abdomen and pelvis was performed using the standard protocol during bolus administration of intravenous contrast. CONTRAST:  127mL OMNIPAQUE IOHEXOL 350 MG/ML SOLN IV. No oral contrast. COMPARISON:  CT abdomen and pelvis 03/10/2002, CT angio chest 04/24/2009 FINDINGS: CTA CHEST FINDINGS Cardiovascular: Aorta normal caliber without aneurysm or dissection. Heart unremarkable. No pericardial effusion. Pulmonary arteries adequately opacified and patent. No evidence of pulmonary embolism. Mediastinum/Nodes: Esophagus unremarkable. Base of cervical region normal appearance. No thoracic adenopathy. Lungs/Pleura: Dependent atelectasis in the posterior lower lobes slightly greater on RIGHT. Lungs otherwise clear. No pulmonary infiltrate, pleural effusion, or pneumothorax. Musculoskeletal: Unremarkable Review of the MIP images confirms the above findings. CT ABDOMEN and PELVIS  FINDINGS Hepatobiliary: Gallbladder and liver normal appearance Pancreas: Normal appearance Spleen: Normal appearance Adrenals/Urinary Tract: Adrenal glands, kidneys, ureters, and bladder normal appearance Stomach/Bowel: Normal appendix. Large and small bowel loops unremarkable. Stomach decompressed, with suboptimally assessed wall thickness Vascular/Lymphatic: Atherosclerotic calcifications aorta and iliac arteries without aneurysm. Vascular structures patent. No adenopathy. Reproductive: Pessary in vagina. Uterus surgically absent. Nonvisualization of ovaries. Other: No free air or free fluid. No hernia or inflammatory process. Musculoskeletal: Intraspinal stimulator at caudal aspect of thoracic spine. Scattered Schmorl's nodes. No acute osseous findings. Review of the MIP images confirms the above findings. IMPRESSION: No evidence of pulmonary embolism. Dependent atelectasis in the posterior lower lobes slightly greater on RIGHT. No acute intra-abdominal or intrapelvic abnormalities. Aortic Atherosclerosis (ICD10-I70.0). Electronically Signed   By: Lavonia Dana M.D.   On: 05/13/2021 11:08   DG Chest Portable 1 View  Result Date: 05/13/2021 CLINICAL DATA:  Chest tightness and pain. EXAM: PORTABLE CHEST 1 VIEW COMPARISON:  To 1822 FINDINGS: 0737 hours. The lungs are clear without focal pneumonia, edema, pneumothorax or pleural effusion. Cardiopericardial silhouette is at upper limits of normal for size. The visualized bony structures of the thorax show no acute abnormality. Thoracic spinal stimulator device again noted. Telemetry leads overlie the chest. IMPRESSION: Stable exam. No acute cardiopulmonary findings. Electronically Signed   By: Misty Stanley M.D.   On: 05/13/2021 08:59     EKG: I have personally reviewed.  Sinus rhythm, QTC 447, early R wave progression, no ischemic change.  Assessment/Plan Principal Problem:   Syncope Active Problems:   Essential hypertension   Hyperlipidemia    Depression with anxiety   Chronic diastolic CHF (congestive heart failure) (HCC)   Chronic back pain   Chest tightness   Lower abdominal pain   Syncope: Etiology is not clear.  CT head negative for acute intracranial abnormalities.  CT angiograms negative for PE.  She has chronic right foot drop and chronic intermittent right arm tingling, but patient does not have new neurologic deficit.  Since patient is suspected to have possible hypertrophic cardiomyopathy, which is a potential differential diagnosis. No seizure activity noted. Other DD includes vasovagal syncope, TIA,  ACS, drug abuse, orthostatic status. Dr.Gollan of card is consulted.  Patient had 2D echo on 12/15/2020 showed possible hypertrophic cardiomyopathy.  The repeated 2D echo on 01/29/2021 showed EF 60 to 65% with grade 1 diastolic dysfunction, did not show hypertrophic cardiomyopathy.  - Place on progressive for obs - Orthostatic vital signs  - Neuro checks  - IVF: 500 cc normal saline, then NS 75 cc/h  Chest tightness: trop negative x 2.  Chest heaviness has resolved. -Continue Lipitor, Coreg -will not give Aspirin now since pt may need to have her spinal stimulator removed  Essential hypertension -IV hydralazine as needed -Coreg, Micardis-HCTZ  Hyperlipidemia -Lipitor  Depression with anxiety -Continue home medications  Chronic diastolic CHF (congestive heart failure) ([facility]): 2D echo on 01/29/2021 showed EF of 60-85% with grade 1 diastolic dysfunction.  Patient does not have leg edema.  BNP 12.1.  CHF is compensated. -Watch volume status closely  Chronic back pain: s/p of spinal stimulator placement. Dr. Lacinda Axon of neurosurgery is consulted. Per Dr. Lacinda Axon,  he can get in touch with pt as an outpatient since this is completely elective procedure.  -Continue home baclofen, Neurontin, Robaxin, tramadol  Lower abdominal pain: Etiology is not clear.  CT abdomen/pelvis is negative. -Check lipase -Follow-up  urinalysis     DVT ppx: SQ Heparin     Code Status: Full code Family Communication: Yes, patient's husband at bed side Disposition Plan:  Anticipate discharge back to previous environment Consults called:   Dr. Rockey Situ of cardiology and Dr. Lacinda Axon of neurosurgery are consulted. Admission status and Level of care: Progressive Cardiac:    for obs    Status is: Observation  The patient remains OBS appropriate and will d/c before 2 midnights.  Dispo: The patient is from: Home              Anticipated d/c is to: Home              Patient currently is not medically stable to d/c.   Difficult to place patient No          Date of Service 05/13/2021    [facility]   If 7PM-7AM, please contact night-coverage [email or web address] 05/13/2021, 1:13 PM

## 2021-05-13 NOTE — ED Notes (Signed)
X-Ray at bedside.

## 2021-05-13 NOTE — ED Triage Notes (Signed)
Pt via SDS, pt was getting a spinal cord stimulator removed. Per nurses, pt was c/o chest tightness before the surgery, nurses rushed up here. Nurses state that pt had become unresponsive in the elevator. On arrival, to the unit pt is A&Ox4 but lethargic.

## 2021-05-13 NOTE — Consult Note (Addendum)
Cardiology Consult    Patient ID: [name] MRN: [id number], DOB/AGE: August 02, 1961   Admit date: 05/13/2021 Date of Consult: 05/13/2021  Primary Physician: [name], [name], MD Primary Cardiologist: Pixie Casino, MD Requesting Provider: Mora Bellman, MD  Patient Profile    [name] is a 60 y.o. female with a history of chest pain and normal coronary arteries, syncope, HTN, HL, LVH w/ ? Of HCM, diastolic dysfunction, chronic back pain s/p spinal stimulator. And GERD, who is being seen today for the evaluation of chest pain and presyncope at the request of Dr. Blaine Hamper.  Past Medical History   Past Medical History:  Diagnosis Date   Anxiety    Aortic atherosclerosis ([place]) 04/16/2021   Atypical angina ([facility])    Back pain    related to spinal stenosis and disc problem, radiates down left buttocks to leg., weakness occ.   Chest pain    a. 03/2015 Cath: nl cors; b. 03/2021 Cor CTA: Ca2+ = 0. Nl Cors.   Chronic pain syndrome    Dyspnea    GERD (gastroesophageal reflux disease)    Grade I diastolic dysfunction    Headache    Hyperlipidemia    Hypertension    Lumbar post-laminectomy syndrome    LVH (left ventricular hypertrophy) 12/15/2020   a. 11/2020 Echo: EF 65-70%, no rwma, sev asymm LVH with IVSd 1.9 cm. No LVOT obs @ rest. Gr1 DD. Triv MR.   PONV (postoperative nausea and vomiting)    Pulmonary nodules    a. 03/2021 CT Chest: 26m pulm nodules in bilat lower lobes. F/u 1 yr.   Right foot drop    Syncope    a. 11/2020 Zio: No significant arrhythmias.   Vaginal foreign object    "Uses Femring"    Past Surgical History:  Procedure Laterality Date   ABDOMINAL HYSTERECTOMY     BIOPSY  12/16/2020   Procedure: BIOPSY;  Surgeon: SLadene Artist MD;  [facility]  Service: Endoscopy;;   CARDIAC CATHETERIZATION N/A 04/18/2015   Procedure: Left Heart Cath and Coronary Angiography;  Surgeon: MCharolette Forward MD;  [facility];  Service:  Cardiovascular;  Laterality: N/A;   COLONOSCOPY W/ BIOPSIES AND POLYPECTOMY     ESOPHAGOGASTRODUODENOSCOPY (EGD) WITH PROPOFOL N/A 12/16/2020   Procedure: ESOPHAGOGASTRODUODENOSCOPY (EGD) WITH PROPOFOL;  Surgeon: SLadene Artist MD;  [facility];  Service: Endoscopy;  Laterality: N/A;   FOOT SURGERY Bilateral    TCowles"bunion,bone spur, tendon" (1) -6'16, (1)-10'16   IR EPIDUROGRAPHY  07/21/2018   LUMBAR LAMINECTOMY/DECOMPRESSION MICRODISCECTOMY Bilateral 12/28/2015   Procedure: MICRO LUMBAR DECOMPRESSION L4 - L5 BILATERALLY;  Surgeon: JSusa Day MD;  [facility]: [facility];  Service: Orthopedics;  Laterality: Bilateral;   LUMBAR LAMINECTOMY/DECOMPRESSION MICRODISCECTOMY Bilateral 03/04/2018   Procedure: Revision of Microlumbar Decompression Bilateral Lumbar Four-Five;  Surgeon: BSusa Day MD;  [facility]  Service: Orthopedics;  Laterality: Bilateral;  90 mins   SAVORY DILATION N/A 12/16/2020   Procedure: SAVORY DILATION;  Surgeon: SLadene Artist MD;  [facility];  Service: Endoscopy;  Laterality: N/A;   SPINAL CORD STIMULATOR INSERTION N/A 09/28/2019   Procedure: THORACIC SPINAL CORD STIMULATOR INSERTION;  Surgeon: BMelina Schools MD;  [facility]  Service: Orthopedics;  Laterality: N/A;  2.5 hrs   TUBAL LIGATION     WISDOM TOOTH EXTRACTION     WOUND EXPLORATION N/A 03/04/2018   Procedure: EXPLORATION OF LUMBAR DECOMPRESSION WOUND;  Surgeon: BSusa Day MD;  Location: [place];  Service: Orthopedics;  Laterality: N/A;     Allergies  Allergies  Allergen Reactions   Cephalosporins Anaphylaxis   Penicillins Anaphylaxis and Hives    Did it involve swelling of the face/tongue/throat, SOB, or low BP? Yes Did it involve sudden or severe rash/hives, skin peeling, or any reaction on the inside of your mouth or nose? Yes Did you need to seek medical attention at a hospital or doctor's office? Yes When did it last happen?      10+ years If all above  answers are "NO", may proceed with cephalosporin use.     Anesthetics, Amide Nausea And Vomiting    Does not know name of it. States they put it on record foot center.    Betadine [Povidone Iodine] Other (See Comments)    "Skin Burn" caused scar on Left buttock   Latex Hives, Itching and Rash   Peach [Prunus Persica] Hives   Claritin [Loratadine]     Rash, pruritis     History of Present Illness     60 y.o. female with a history of chest pain and normal coronary arteries, syncope, HTN, HL, LVH w/ ? Of HCM, diastolic dysfunction, chronic back pain s/p spinal stimulator.  She has a long history of chest pain with catheterization back in June 2016 showing normal coronary arteries.  In February 2022, she was admitted to [facility] following a syncopal episode.  Echocardiogram during admission showed an EF of 65 to 70% with severe asymmetric LVH.  Recommendation was made for cardiac MRI however could not be performed secondary to spinal stimulator.  She subsequently wore an event monitor in late February and March of this year, which did not show any significant arrhythmias.  She had a follow-up echocardiogram in April of this year which continued showed normal LV function and grade 1 diastolic dysfunction.  Though the echo report suggests that the septum is normal, on relook, it was felt to measure about 2 cm by Dr. Debara Pickett.  Patient was last seen in cardiology clinic on June 1, at which time she reported recurrent chest pain and dyspnea in the setting of uncontrolled hypertension.  She was placed on telmisartan/HCTZ 40/12.5 mg daily.  Coronary CT angiography was ordered and subsequently performed showing normal coronary arteries with a coronary calcium score of 0.  Patient has since followed up with neurosurgery with plan to remove spinal stimulator.  It is for this reason, she presented to [facility] this morning.  Since the funeral of her brother last Thursday, she has been experiencing  recurrent intermittent chest tightness and since the afternoon of July 17, she has also been experiencing lightheadedness.  Following arrival to [facility] today, she mentioned this to preoperative staff and her case was canceled.  She was taken to the emergency department and on route, per notes, patient became unresponsive in the elevator for short period of time prior to regaining consciousness.  Notes indicate that she was lethargic upon regaining consciousness.  In the ED, she has been hypertensive.  She is currently awake alert and oriented x3 and disappointed that her procedure has been canceled.  Lab work is unremarkable.  No events on telemetry in the emergency department.  She currently denies chest pain but continues to feel lightheaded.  Home Medications     Prior to Admission medications   Medication Sig Start Date End Date Taking? Authorizing Provider  atorvastatin (LIPITOR) 20 MG tablet TAKE 1 TABLET BY MOUTH ONCE DAILY Patient taking  differently: Take 20 mg by mouth daily. 04/22/20 05/22/21 Yes [name], [name], MD  baclofen (LIORESAL) 10 MG tablet TAKE 1 TABLET BY MOUTH THREE TIMES DAILY AS NEEDED Patient taking differently: Take 10 mg by mouth daily as needed (Nerves). 01/17/21 01/17/22 Yes Suella Broad, MD  carvedilol (COREG) 25 MG tablet Take 1 tablet (25 mg total) by mouth 2 (two) times daily with a meal. 02/07/21 02/07/22 Yes Hilty, Nadean Corwin, MD  cholecalciferol (VITAMIN D3) 25 MCG (1000 UNIT) tablet Take 1,000 Units by mouth daily.   Yes [provider]  diphenhydrAMINE (BENADRYL) 25 MG tablet Take 25 mg by mouth daily as needed for itching.   Yes [provider]  Estradiol Acetate (FEMRING) 0.05 MG/24HR RING USE ONE RING VAGINALLY EVERY 3 MONTHS AS DIRECTED Patient taking differently: every 3 (three) months. 02/21/21  Yes   gabapentin (NEURONTIN) 400 MG capsule TAKE 1 CAPSULE BY MOUTH 3 TIMES DAILY Patient taking differently: Take 400 mg by mouth 3 (three) times daily.  02/08/21  Yes   meclizine (ANTIVERT) 25 MG tablet TAKE 1 TABLET BY MOUTH 2 TIMES DAILY AS NEEDED FOR DIZZINESS Patient taking differently: Take 25 mg by mouth daily as needed for dizziness. 12/20/20 12/20/21 Yes Hall, Carole N, DO  methocarbamol (ROBAXIN) 500 MG tablet TAKE 1 TABLET BY MOUTH AT BEDTIME AS NEEDED Patient taking differently: Take 500 mg by mouth at bedtime as needed for muscle spasms. 12/27/20 12/27/21 Yes Levy Pupa, PA-C  telmisartan-hydrochlorothiazide (MICARDIS HCT) 40-12.5 MG tablet Take 1 tablet by mouth daily. 03/27/21  Yes Hilty, Nadean Corwin, MD  ALPRAZolam Duanne Moron) 0.5 MG tablet Take 1/2 to 1 tablet (0.25-0.5 mg total) by mouth at bedtime as needed for anxiety. 05/03/21   [name], [name], MD  DULoxetine (CYMBALTA) 60 MG capsule Take 1 capsule (60 mg total) by mouth daily. 05/03/21   [name], [name], MD  PFIZER-BIONT COVID-19 VAC-TRIS SUSP injection  02/15/21   [provider]  traMADol Veatrice Bourbon) 50 MG tablet TAKE 1 TABLET BY MOUTH 3 TIMES DAILY AS NEEDED FOR PAIN 05/01/21 10/28/21  Meredith Staggers, MD     Family History    Family History  Problem Relation Age of Onset   Heart attack Mother    Lung cancer Father    Cancer Father    Pancreatic cancer Sister    Breast cancer Sister 79   Throat cancer Brother    Multiple myeloma Sister    Breast cancer Sister        diagnosed in her 85's   Heart attack Sister    Stomach cancer Cousin    Colon cancer Neg Hx    She indicated that her mother is deceased. She indicated that her father is deceased. She indicated that both of her sisters are deceased. She indicated that all of her three brothers are deceased. She indicated that the status of her cousin is unknown. She indicated that the status of her neg hx is unknown.   Social History    Social History   Socioeconomic History   Marital status: Married    Spouse name: Not on file   Number of children: Not on file   Years of education: Not on file   Highest education  level: Not on file  Occupational History   Not on file  Tobacco Use   Smoking status: Never   Smokeless tobacco: Never  Vaping Use   Vaping Use: Never used  Substance and Sexual Activity   Alcohol use: No  Drug use: No   Sexual activity: Yes    Partners: Male    Birth control/protection: Post-menopausal  Other Topics Concern   Not on file  Social History Narrative   Not on file   Social Determinants of Health   Financial Resource Strain: Not on file  Food Insecurity: Not on file  Transportation Needs: Not on file  Physical Activity: Not on file  Stress: Not on file  Social Connections: Not on file  Intimate Partner Violence: Not on file     Review of Systems    General:  No chills, fever, night sweats or weight changes.  Cardiovascular:  +++ chest pain, +++ dyspnea on exertion, +++ presyncope, no edema, orthopnea, palpitations, paroxysmal nocturnal dyspnea. Dermatological: No rash, lesions/masses Respiratory: No cough, dyspnea Urologic: No hematuria, dysuria Abdominal:   No nausea, vomiting, diarrhea, bright red blood per rectum, melena, or hematemesis Neurologic:  No visual changes, wkns, changes in mental status. All other systems reviewed and are otherwise negative except as noted above.  Physical Exam    Blood pressure (!) 149/93, pulse 62, temperature 98.2 F (36.8 C), temperature source Oral, resp. rate 15, height 5' 5" (1.651 m), weight 76.7 kg, SpO2 98 %.  General: Pleasant, NAD Psych: Normal affect. Neuro: Alert and oriented X 3. Moves all extremities spontaneously. HEENT: Normal  Neck: Supple without bruits or JVD. Lungs:  Resp regular and unlabored, CTA. Heart: RRR no s3, s4, or murmurs. Abdomen: Soft, non-tender, non-distended, BS + x 4.  Extremities: No clubbing, cyanosis or edema. DP/PT2+, Radials 2+ and equal bilaterally.  Labs    Cardiac Enzymes Recent Labs  Lab 05/13/21 0642 05/13/21 0924  TROPONINIHS 3 3      Lab Results  Component  Value Date   WBC 3.5 (L) 05/13/2021   HGB 11.8 (L) 05/13/2021   HCT 35.2 (L) 05/13/2021   MCV 88.9 05/13/2021   PLT 257 05/13/2021    Recent Labs  Lab 05/13/21 0642  NA 136  K 3.6  CL 101  CO2 27  BUN 15  CREATININE 0.99  CALCIUM 8.9  PROT 6.9  BILITOT 0.5  ALKPHOS 52  ALT 17  AST 20  GLUCOSE 107*   Lab Results  Component Value Date   CHOL 198 04/20/2020   HDL 61.70 04/20/2020   LDLCALC 123 (H) 04/20/2020   TRIG 62.0 04/20/2020   Lab Results  Component Value Date   DDIMER 0.41 12/14/2020    Radiology Studies    CT Head Wo Contrast  Result Date: 05/13/2021 CLINICAL DATA:  Altered mental status and headaches, initial encounter EXAM: CT HEAD WITHOUT CONTRAST TECHNIQUE: Contiguous axial images were obtained from the base of the skull through the vertex without intravenous contrast. COMPARISON:  12/14/2020 FINDINGS: Brain: No evidence of acute infarction, hemorrhage, hydrocephalus, extra-axial collection or mass lesion/mass effect. Vascular: No hyperdense vessel or unexpected calcification. Skull: Normal. Negative for fracture or focal lesion. Sinuses/Orbits: No acute finding. Other: None. IMPRESSION: No acute intracranial abnormality noted. Electronically Signed   By: Inez Catalina M.D.   On: 05/13/2021 10:54   CT Angio Chest PE W and/or Wo Contrast  Result Date: 05/13/2021 CLINICAL DATA:  Weakness, headache, chest pain, abdominal distension EXAM: CT ANGIOGRAPHY CHEST CT ABDOMEN AND PELVIS WITH CONTRAST TECHNIQUE: Multidetector CT imaging of the chest was performed using the standard protocol during bolus administration of intravenous contrast. Multiplanar CT image reconstructions and MIPs were obtained to evaluate the vascular anatomy. Multidetector CT imaging of the abdomen and pelvis  was performed using the standard protocol during bolus administration of intravenous contrast. CONTRAST:  129m OMNIPAQUE IOHEXOL 350 MG/ML SOLN IV. No oral contrast. COMPARISON:  CT abdomen and  pelvis 03/10/2002, CT angio chest 04/24/2009 FINDINGS: CTA CHEST FINDINGS Cardiovascular: Aorta normal caliber without aneurysm or dissection. Heart unremarkable. No pericardial effusion. Pulmonary arteries adequately opacified and patent. No evidence of pulmonary embolism. Mediastinum/Nodes: Esophagus unremarkable. Base of cervical region normal appearance. No thoracic adenopathy. Lungs/Pleura: Dependent atelectasis in the posterior lower lobes slightly greater on RIGHT. Lungs otherwise clear. No pulmonary infiltrate, pleural effusion, or pneumothorax. Musculoskeletal: Unremarkable Review of the MIP images confirms the above findings. CT ABDOMEN and PELVIS FINDINGS Hepatobiliary: Gallbladder and liver normal appearance Pancreas: Normal appearance Spleen: Normal appearance Adrenals/Urinary Tract: Adrenal glands, kidneys, ureters, and bladder normal appearance Stomach/Bowel: Normal appendix. Large and small bowel loops unremarkable. Stomach decompressed, with suboptimally assessed wall thickness Vascular/Lymphatic: Atherosclerotic calcifications aorta and iliac arteries without aneurysm. Vascular structures patent. No adenopathy. Reproductive: Pessary in vagina. Uterus surgically absent. Nonvisualization of ovaries. Other: No free air or free fluid. No hernia or inflammatory process. Musculoskeletal: Intraspinal stimulator at caudal aspect of thoracic spine. Scattered Schmorl's nodes. No acute osseous findings. Review of the MIP images confirms the above findings. IMPRESSION: No evidence of pulmonary embolism. Dependent atelectasis in the posterior lower lobes slightly greater on RIGHT. No acute intra-abdominal or intrapelvic abnormalities. Aortic Atherosclerosis (ICD10-I70.0). Electronically Signed   By: MLavonia DanaM.D.   On: 05/13/2021 11:08   CT ABDOMEN PELVIS W CONTRAST  Result Date: 05/13/2021 CLINICAL DATA:  Weakness, headache, chest pain, abdominal distension EXAM: CT ANGIOGRAPHY CHEST CT ABDOMEN AND  PELVIS WITH CONTRAST TECHNIQUE: Multidetector CT imaging of the chest was performed using the standard protocol during bolus administration of intravenous contrast. Multiplanar CT image reconstructions and MIPs were obtained to evaluate the vascular anatomy. Multidetector CT imaging of the abdomen and pelvis was performed using the standard protocol during bolus administration of intravenous contrast. CONTRAST:  1087mOMNIPAQUE IOHEXOL 350 MG/ML SOLN IV. No oral contrast. COMPARISON:  CT abdomen and pelvis 03/10/2002, CT angio chest 04/24/2009 FINDINGS: CTA CHEST FINDINGS Cardiovascular: Aorta normal caliber without aneurysm or dissection. Heart unremarkable. No pericardial effusion. Pulmonary arteries adequately opacified and patent. No evidence of pulmonary embolism. Mediastinum/Nodes: Esophagus unremarkable. Base of cervical region normal appearance. No thoracic adenopathy. Lungs/Pleura: Dependent atelectasis in the posterior lower lobes slightly greater on RIGHT. Lungs otherwise clear. No pulmonary infiltrate, pleural effusion, or pneumothorax. Musculoskeletal: Unremarkable Review of the MIP images confirms the above findings. CT ABDOMEN and PELVIS FINDINGS Hepatobiliary: Gallbladder and liver normal appearance Pancreas: Normal appearance Spleen: Normal appearance Adrenals/Urinary Tract: Adrenal glands, kidneys, ureters, and bladder normal appearance Stomach/Bowel: Normal appendix. Large and small bowel loops unremarkable. Stomach decompressed, with suboptimally assessed wall thickness Vascular/Lymphatic: Atherosclerotic calcifications aorta and iliac arteries without aneurysm. Vascular structures patent. No adenopathy. Reproductive: Pessary in vagina. Uterus surgically absent. Nonvisualization of ovaries. Other: No free air or free fluid. No hernia or inflammatory process. Musculoskeletal: Intraspinal stimulator at caudal aspect of thoracic spine. Scattered Schmorl's nodes. No acute osseous findings. Review of  the MIP images confirms the above findings. IMPRESSION: No evidence of pulmonary embolism. Dependent atelectasis in the posterior lower lobes slightly greater on RIGHT. No acute intra-abdominal or intrapelvic abnormalities. Aortic Atherosclerosis (ICD10-I70.0). Electronically Signed   By: MaLavonia Dana.D.   On: 05/13/2021 11:08   DG Chest Portable 1 View  Result Date: 05/13/2021 CLINICAL DATA:  Chest tightness and pain. EXAM: PORTABLE CHEST 1 VIEW  COMPARISON:  To 1822 FINDINGS: 0737 hours. The lungs are clear without focal pneumonia, edema, pneumothorax or pleural effusion. Cardiopericardial silhouette is at upper limits of normal for size. The visualized bony structures of the thorax show no acute abnormality. Thoracic spinal stimulator device again noted. Telemetry leads overlie the chest. IMPRESSION: Stable exam. No acute cardiopulmonary findings. Electronically Signed   By: Misty Stanley M.D.   On: 05/13/2021 08:59    ECG & Cardiac Imaging    RSR, 74, 1st deg AVB.  No acute ST/T changes - personally reviewed.  Assessment & Plan    1.  Chest pain:  Pt w/ a long h/o atypical chest pain and nl cors on cath in 2016 and more recently, nl cors and Ca2+ score of zero on coronary CTA in June.  Despite these findings, she has continued to have intermittent chest pain @ rest, which is often constant.  She began having recurrent c/p on 7/17, assoc w/ malaise and lightheadedness, and this persisted today, when she presented for extraction of spinal stimulator, prompting ED eval.  ECG w/o acute ST/T changes.  HsTrops are nl.  She is currently c/p free.  Given prior w/u and atypical/non-cardiac chest pain, no further ischemic eval is warranted prior to planned procedure.  2.  Presyncope/Syncope/LVH:  Pt w/ syncope in Feb and finding of LVH w/ ? HCM.  Zio monitoring @ that time showed no significant arrhythmias.  Pt has been having constant lightheadedness and presyncope since yesterday afternoon.  En route to  ED, notes indicate that she briefly lost consciousness, though she was not on a monitor.  VS here, show that she's hypertensive.  No events on tele since arrival to the ED.  She reports poor PO intake over the weekend in the setting of mourning her brother.  ? Potential role of dehydration w/ background of HCM as a contributor/cause of presyncope/syncope.  Though BUN/Creat nl.  Encourage adequate hydration.  May need to reconsider use of ARB/diuretic combo.  Follow on tele while hospitalized.  Eventual plan for cMRI removal of spinal stimulator.  3.  Essential HTN:  Hypertensive in ED.  Cont home meds.  4.  Back pain/Preoperative cardiovascular evaluation:  See #1.  No further ischemic eval warranted.  Hydrate throughout perioperative period.  Signed, Murray Hodgkins, NP 05/13/2021, 5:24 PM  For questions or updates, please contact   Please consult [email or web address] for contact info under Cardiology/STEMI.

## 2021-05-13 NOTE — ED Provider Notes (Signed)
[facility] Emergency Department Provider Note  ____________________________________________   Event Date/Time   First MD Initiated Contact with Patient 05/13/21 [phone]     (approximate)  I have reviewed the triage vital signs and the nursing notes.   HISTORY  Chief Complaint Chest Pain    HPI [name] is a 60 y.o. female with HTN,Hypertrophic cardiomyopathy who comes in chest pain syncope. Pt was suppose to have her spinal stimulator removed to get cardiac MRI due to syncope episodes and recent diagnosis of hypertrophic cardiomyopathy.  Pt did not eat breakfast. Pt reports weakness/chest tightness, constant, mild nothing makes it better or worse.  Reports chronic R leg weakness/foot drop and suppose to have brace.  She reports some long term L arm tingling as well.  She reports that she is had multiple family members die secondary to their heart.  Most recently she reports her brother  In pre op pt had chest tightness, was being brought to ER and had witnessed syncope episode while in the stretcher.  According to the nurse her eyes closed and she was unresponsive and her head drooped off to the side and they were concerned that she did not have a pulse.  While being rolled through the emergency room patient had woken up and was awake.          Past Medical History:  Diagnosis Date   Anxiety    Aortic atherosclerosis ([place]) 04/16/2021   Atypical angina (HCC)    Back pain    related to spinal stenosis and disc problem, radiates down left buttocks to leg., weakness occ.   Chronic pain syndrome    Dyspnea    GERD (gastroesophageal reflux disease)    Grade I diastolic dysfunction    Headache    Hyperlipidemia    Hypertension    Lumbar post-laminectomy syndrome    LVH (left ventricular hypertrophy) 12/15/2020   severe asymmetric LVH with IVSd 1.9 cm by TTE   PONV (postoperative nausea and vomiting)    Right foot drop    Vaginal foreign  object    "Uses Femring"    Patient Active Problem List   Diagnosis Date Noted   Syncope and collapse 01/01/2021   Abnormal echocardiogram 01/01/2021   BPPV (benign paroxysmal positional vertigo), left 12/26/2020   Odynophagia    Dysphagia 12/15/2020   Postural dizziness with presyncope 12/14/2020   Lumbar post-laminectomy syndrome    Chronic pain 09/28/2019   Lumbar spinal stenosis    Radicular pain    Gait disorder    Difficulty in urination 07/13/2018   Back pain 07/12/2018   Hyperlipidemia 04/16/2018   GAD (generalized anxiety disorder) 04/16/2018   AKI (acute kidney injury) ([place])    Benign essential HTN    Major depression, recurrent ([place])    Lumbar radiculopathy 03/09/2018   Paraparesis ([place]) 03/09/2018   Essential hypertension    Chronic nonintractable headache    Leukocytosis    Acute blood loss anemia    Postoperative pain    Generalized weakness    Spinal stenosis at L4-L5 level 03/04/2018   Spinal stenosis of lumbar region 12/28/2015   Chest pain 04/16/2015    Past Surgical History:  Procedure Laterality Date   ABDOMINAL HYSTERECTOMY     BIOPSY  12/16/2020   Procedure: BIOPSY;  Surgeon: Ladene Artist, MD;  Location: [facility] ENDOSCOPY;  Service: Endoscopy;;   CARDIAC CATHETERIZATION N/A 04/18/2015   Procedure: Left Heart Cath and Coronary Angiography;  Surgeon: [name],  MD;  Location: [facility] CV LAB;  Service: Cardiovascular;  Laterality: N/A;   COLONOSCOPY W/ BIOPSIES AND POLYPECTOMY     ESOPHAGOGASTRODUODENOSCOPY (EGD) WITH PROPOFOL N/A 12/16/2020   Procedure: ESOPHAGOGASTRODUODENOSCOPY (EGD) WITH PROPOFOL;  Surgeon: Ladene Artist, MD;  Location: [facility] ENDOSCOPY;  Service: Endoscopy;  Laterality: N/A;   FOOT SURGERY Bilateral    [place] "bunion,bone spur, tendon" (1) -6'16, (1)-10'16   IR EPIDUROGRAPHY  07/21/2018   LUMBAR LAMINECTOMY/DECOMPRESSION MICRODISCECTOMY Bilateral 12/28/2015   Procedure: MICRO LUMBAR DECOMPRESSION L4 - L5  BILATERALLY;  Surgeon: [name], MD;  Location: [facility] ORS;  Service: Orthopedics;  Laterality: Bilateral;   LUMBAR LAMINECTOMY/DECOMPRESSION MICRODISCECTOMY Bilateral 03/04/2018   Procedure: Revision of Microlumbar Decompression Bilateral Lumbar Four-Five;  Surgeon: [name], MD;  Location: [place];  Service: Orthopedics;  Laterality: Bilateral;  90 mins   SAVORY DILATION N/A 12/16/2020   Procedure: SAVORY DILATION;  Surgeon: Ladene Artist, MD;  Location: [facility] ENDOSCOPY;  Service: Endoscopy;  Laterality: N/A;   SPINAL CORD STIMULATOR INSERTION N/A 09/28/2019   Procedure: THORACIC SPINAL CORD STIMULATOR INSERTION;  Surgeon: Melina Schools, MD;  Location: [place];  Service: Orthopedics;  Laterality: N/A;  2.5 hrs   TUBAL LIGATION     WISDOM TOOTH EXTRACTION     WOUND EXPLORATION N/A 03/04/2018   Procedure: EXPLORATION OF LUMBAR DECOMPRESSION WOUND;  Surgeon: [name], MD;  Location: [place];  Service: Orthopedics;  Laterality: N/A;    Prior to Admission medications   Medication Sig Start Date End Date Taking? Authorizing Provider  atorvastatin (LIPITOR) 20 MG tablet TAKE 1 TABLET BY MOUTH ONCE DAILY Patient taking differently: Take 20 mg by mouth daily. 04/22/20 05/22/21 Yes [name], [name], MD  baclofen (LIORESAL) 10 MG tablet TAKE 1 TABLET BY MOUTH THREE TIMES DAILY AS NEEDED Patient taking differently: Take 10 mg by mouth daily as needed (Nerves). 01/17/21 01/17/22 Yes Suella Broad, MD  carvedilol (COREG) 25 MG tablet Take 1 tablet (25 mg total) by mouth 2 (two) times daily with a meal. 02/07/21 02/07/22 Yes Hilty, Nadean Corwin, MD  cholecalciferol (VITAMIN D3) 25 MCG (1000 UNIT) tablet Take 1,000 Units by mouth daily.   Yes [provider]  diphenhydrAMINE (BENADRYL) 25 MG tablet Take 25 mg by mouth daily as needed for itching.   Yes [provider]  Estradiol Acetate (FEMRING) 0.05 MG/24HR RING USE ONE RING VAGINALLY EVERY 3 MONTHS AS DIRECTED Patient taking differently: every  3 (three) months. 02/21/21  Yes   gabapentin (NEURONTIN) 400 MG capsule TAKE 1 CAPSULE BY MOUTH 3 TIMES DAILY Patient taking differently: Take 400 mg by mouth 3 (three) times daily. 02/08/21  Yes   meclizine (ANTIVERT) 25 MG tablet TAKE 1 TABLET BY MOUTH 2 TIMES DAILY AS NEEDED FOR DIZZINESS Patient taking differently: Take 25 mg by mouth daily as needed for dizziness. 12/20/20 12/20/21 Yes Hall, Carole N, DO  methocarbamol (ROBAXIN) 500 MG tablet TAKE 1 TABLET BY MOUTH AT BEDTIME AS NEEDED Patient taking differently: Take 500 mg by mouth at bedtime as needed for muscle spasms. 12/27/20 12/27/21 Yes Levy Pupa, PA-C  telmisartan-hydrochlorothiazide (MICARDIS HCT) 40-12.5 MG tablet Take 1 tablet by mouth daily. 03/27/21  Yes Hilty, Nadean Corwin, MD  ALPRAZolam Duanne Moron) 0.5 MG tablet Take 1/2 to 1 tablet (0.25-0.5 mg total) by mouth at bedtime as needed for anxiety. 05/03/21   [name], [name], MD  DULoxetine (CYMBALTA) 60 MG capsule Take 1 capsule (60 mg total) by mouth daily. 05/03/21   [name], Betty  G, MD  PFIZER-BIONT COVID-19 VAC-TRIS SUSP injection  02/15/21   [provider]  traMADol (ULTRAM) 50 MG tablet TAKE 1 TABLET BY MOUTH 3 TIMES DAILY AS NEEDED FOR PAIN 05/01/21 10/28/21  Meredith Staggers, MD    Allergies Cephalosporins; Penicillins; Anesthetics, amide; Betadine [povidone iodine]; Latex; Peach [prunus persica]; and Claritin [loratadine]  Family History  Problem Relation Age of Onset   Heart attack Mother    Lung cancer Father    Cancer Father    Pancreatic cancer Sister    Breast cancer Sister 62   Throat cancer Brother    Multiple myeloma Sister    Breast cancer Sister        diagnosed in her 53's   Heart attack Sister    Stomach cancer Cousin    Colon cancer Neg Hx     Social History Social History   Tobacco Use   Smoking status: Never   Smokeless tobacco: Never  Vaping Use   Vaping Use: Never used  Substance Use Topics   Alcohol use: No   Drug use: No      Review  of Systems Constitutional: No fever/chills, weakness  Eyes: No visual changes. ENT: No sore throat. Cardiovascular: Positive chest pain Respiratory: Denies shortness of breath. Gastrointestinal: No abdominal pain.  No nausea, no vomiting.  No diarrhea.  No constipation. Genitourinary: Negative for dysuria. Musculoskeletal: Negative for back pain. Skin: Negative for rash. Neurological: Negative for headaches, focal weakness or numbness. All other ROS negative ____________________________________________   PHYSICAL EXAM:  VITAL SIGNS: ED Triage Vitals  Enc Vitals Group     BP 05/13/21 0728 (!) 160/97     Pulse Rate 05/13/21 0728 73     Resp 05/13/21 0728 (!) 21     Temp 05/13/21 0728 98.2 F (36.8 C)     Temp Source 05/13/21 0728 Oral     SpO2 05/13/21 0728 100 %     Weight 05/13/21 0725 169 lb (76.7 kg)     Height 05/13/21 0725 5' 5" (1.651 m)     Head Circumference --      Peak Flow --      Pain Score 05/13/21 0725 5     Pain Loc --      Pain Edu? --      Excl. in [place]? --     Constitutional: Alert and oriented. Well appearing and in no acute distress. Eyes: Conjunctivae are normal. EOMI. Head: Atraumatic. Nose: No congestion/rhinnorhea. Mouth/Throat: Mucous membranes are moist.   Neck: No stridor. Trachea Midline. FROM Cardiovascular: Normal rate, regular rhythm. Grossly normal heart sounds.  Good peripheral circulation. Respiratory: Normal respiratory effort.  No retractions. Lungs CTAB. Gastrointestinal: Soft and nontender. No distention. No abdominal bruits.  Musculoskeletal: L arm tingling (baseline) R leg weakness/foot drop (baseline per pt)  Neurologic:  Normal speech and language. No gross focal neurologic deficits are appreciated.  Skin:  Skin is warm, dry and intact. No rash noted. Psychiatric: Mood and affect are normal. Speech and behavior are normal. GU: Deferred   ____________________________________________   LABS (all labs ordered are listed, but  only abnormal results are displayed)  Labs Reviewed  CBC WITH DIFFERENTIAL/PLATELET - Abnormal; Notable for the following components:      Result Value   WBC 3.5 (*)    Hemoglobin 11.8 (*)    HCT 35.2 (*)    Neutro Abs 1.5 (*)    All other components within normal limits  COMPREHENSIVE METABOLIC PANEL - Abnormal; Notable  for the following components:   Glucose, Bld 107 (*)    All other components within normal limits  CBG MONITORING, ED - Abnormal; Notable for the following components:   Glucose-Capillary 114 (*)    All other components within normal limits  URINE CULTURE  RESP PANEL BY RT-PCR (FLU A&B, COVID) ARPGX2  MAGNESIUM  BRAIN NATRIURETIC PEPTIDE  TSH  T4, FREE  ABO/RH  TROPONIN I (HIGH SENSITIVITY)  TROPONIN I (HIGH SENSITIVITY)   ____________________________________________   ED ECG REPORT I, Vanessa [place], the attending physician, personally viewed and interpreted this ECG.  Normal sinus rate of 74, type 1 AV block, no st elevation, no twi.  ____________________________________________  RADIOLOGY  Official radiology report(s): CT Head Wo Contrast  Result Date: 05/13/2021 CLINICAL DATA:  Altered mental status and headaches, initial encounter EXAM: CT HEAD WITHOUT CONTRAST TECHNIQUE: Contiguous axial images were obtained from the base of the skull through the vertex without intravenous contrast. COMPARISON:  12/14/2020 FINDINGS: Brain: No evidence of acute infarction, hemorrhage, hydrocephalus, extra-axial collection or mass lesion/mass effect. Vascular: No hyperdense vessel or unexpected calcification. Skull: Normal. Negative for fracture or focal lesion. Sinuses/Orbits: No acute finding. Other: None. IMPRESSION: No acute intracranial abnormality noted. Electronically Signed   By: Inez Catalina M.D.   On: 05/13/2021 10:54   CT Angio Chest PE W and/or Wo Contrast  Result Date: 05/13/2021 CLINICAL DATA:  Weakness, headache, chest pain, abdominal distension EXAM: CT  ANGIOGRAPHY CHEST CT ABDOMEN AND PELVIS WITH CONTRAST TECHNIQUE: Multidetector CT imaging of the chest was performed using the standard protocol during bolus administration of intravenous contrast. Multiplanar CT image reconstructions and MIPs were obtained to evaluate the vascular anatomy. Multidetector CT imaging of the abdomen and pelvis was performed using the standard protocol during bolus administration of intravenous contrast. CONTRAST:  135m OMNIPAQUE IOHEXOL 350 MG/ML SOLN IV. No oral contrast. COMPARISON:  CT abdomen and pelvis 03/10/2002, CT angio chest 04/24/2009 FINDINGS: CTA CHEST FINDINGS Cardiovascular: Aorta normal caliber without aneurysm or dissection. Heart unremarkable. No pericardial effusion. Pulmonary arteries adequately opacified and patent. No evidence of pulmonary embolism. Mediastinum/Nodes: Esophagus unremarkable. Base of cervical region normal appearance. No thoracic adenopathy. Lungs/Pleura: Dependent atelectasis in the posterior lower lobes slightly greater on RIGHT. Lungs otherwise clear. No pulmonary infiltrate, pleural effusion, or pneumothorax. Musculoskeletal: Unremarkable Review of the MIP images confirms the above findings. CT ABDOMEN and PELVIS FINDINGS Hepatobiliary: Gallbladder and liver normal appearance Pancreas: Normal appearance Spleen: Normal appearance Adrenals/Urinary Tract: Adrenal glands, kidneys, ureters, and bladder normal appearance Stomach/Bowel: Normal appendix. Large and small bowel loops unremarkable. Stomach decompressed, with suboptimally assessed wall thickness Vascular/Lymphatic: Atherosclerotic calcifications aorta and iliac arteries without aneurysm. Vascular structures patent. No adenopathy. Reproductive: Pessary in vagina. Uterus surgically absent. Nonvisualization of ovaries. Other: No free air or free fluid. No hernia or inflammatory process. Musculoskeletal: Intraspinal stimulator at caudal aspect of thoracic spine. Scattered Schmorl's nodes. No  acute osseous findings. Review of the MIP images confirms the above findings. IMPRESSION: No evidence of pulmonary embolism. Dependent atelectasis in the posterior lower lobes slightly greater on RIGHT. No acute intra-abdominal or intrapelvic abnormalities. Aortic Atherosclerosis (ICD10-I70.0). Electronically Signed   By: MLavonia DanaM.D.   On: 05/13/2021 11:08   CT ABDOMEN PELVIS W CONTRAST  Result Date: 05/13/2021 CLINICAL DATA:  Weakness, headache, chest pain, abdominal distension EXAM: CT ANGIOGRAPHY CHEST CT ABDOMEN AND PELVIS WITH CONTRAST TECHNIQUE: Multidetector CT imaging of the chest was performed using the standard protocol during bolus administration of intravenous contrast. Multiplanar  CT image reconstructions and MIPs were obtained to evaluate the vascular anatomy. Multidetector CT imaging of the abdomen and pelvis was performed using the standard protocol during bolus administration of intravenous contrast. CONTRAST:  181m OMNIPAQUE IOHEXOL 350 MG/ML SOLN IV. No oral contrast. COMPARISON:  CT abdomen and pelvis 03/10/2002, CT angio chest 04/24/2009 FINDINGS: CTA CHEST FINDINGS Cardiovascular: Aorta normal caliber without aneurysm or dissection. Heart unremarkable. No pericardial effusion. Pulmonary arteries adequately opacified and patent. No evidence of pulmonary embolism. Mediastinum/Nodes: Esophagus unremarkable. Base of cervical region normal appearance. No thoracic adenopathy. Lungs/Pleura: Dependent atelectasis in the posterior lower lobes slightly greater on RIGHT. Lungs otherwise clear. No pulmonary infiltrate, pleural effusion, or pneumothorax. Musculoskeletal: Unremarkable Review of the MIP images confirms the above findings. CT ABDOMEN and PELVIS FINDINGS Hepatobiliary: Gallbladder and liver normal appearance Pancreas: Normal appearance Spleen: Normal appearance Adrenals/Urinary Tract: Adrenal glands, kidneys, ureters, and bladder normal appearance Stomach/Bowel: Normal appendix. Large  and small bowel loops unremarkable. Stomach decompressed, with suboptimally assessed wall thickness Vascular/Lymphatic: Atherosclerotic calcifications aorta and iliac arteries without aneurysm. Vascular structures patent. No adenopathy. Reproductive: Pessary in vagina. Uterus surgically absent. Nonvisualization of ovaries. Other: No free air or free fluid. No hernia or inflammatory process. Musculoskeletal: Intraspinal stimulator at caudal aspect of thoracic spine. Scattered Schmorl's nodes. No acute osseous findings. Review of the MIP images confirms the above findings. IMPRESSION: No evidence of pulmonary embolism. Dependent atelectasis in the posterior lower lobes slightly greater on RIGHT. No acute intra-abdominal or intrapelvic abnormalities. Aortic Atherosclerosis (ICD10-I70.0). Electronically Signed   By: MLavonia DanaM.D.   On: 05/13/2021 11:08   DG Chest Portable 1 View  Result Date: 05/13/2021 CLINICAL DATA:  Chest tightness and pain. EXAM: PORTABLE CHEST 1 VIEW COMPARISON:  To 1822 FINDINGS: 0737 hours. The lungs are clear without focal pneumonia, edema, pneumothorax or pleural effusion. Cardiopericardial silhouette is at upper limits of normal for size. The visualized bony structures of the thorax show no acute abnormality. Thoracic spinal stimulator device again noted. Telemetry leads overlie the chest. IMPRESSION: Stable exam. No acute cardiopulmonary findings. Electronically Signed   By: EMisty StanleyM.D.   On: 05/13/2021 08:59    ____________________________________________   PROCEDURES  Procedure(s) performed (including Critical Care):  .1-3 Lead EKG Interpretation  Date/Time: 05/13/2021 11:19 AM Performed by: FVanessa [place] MD Authorized by: FVanessa Linden MD     Interpretation: normal     ECG rate:  60s   ECG rate assessment: normal     Rhythm: sinus rhythm     Ectopy: none     Conduction: normal   Comments:     Alternates between sinus bradycardia and sinus  rate   ____________________________________________   INITIAL IMPRESSION / ASSESSMENT AND PLAN / ED COURSE   SNakita Santerrewas evaluated in Emergency Department on 05/13/2021 for the symptoms described in the history of present illness. She was evaluated in the context of the global COVID-19 pandemic, which necessitated consideration that the patient might be at risk for infection with the SARS-CoV-2 virus that causes COVID-19. Institutional protocols and algorithms that pertain to the evaluation of patients at risk for COVID-19 are in a state of rapid change based on information released by regulatory bodies including the CDC and federal and state organizations. These policies and algorithms were followed during the patient's care in the ED.    Most Likely DDx:  -ACS/Arrythmia given history of HCM    DDx that was also considered d/t potential to cause harm, but was  found less likely based on history and physical (as detailed above): -PNA (no fevers, cough but CXR to evaluate) -PNX (reassured with equal b/l breath sounds, CXR to evaluate) -Symptomatic anemia (will get H&H) -Pulmonary embolism as no sob at rest, not pleuritic in nature, no hypoxia -Aortic Dissection as no tearing pain and no radiation to the mid back, pulses equal -Pericarditis no rub on exam, EKG changes or hx to suggest dx -Tamponade (no notable SOB, tachycardic, hypotensive) -Esophageal rupture (no h/o diffuse vomitting/no crepitus)   Unable to get MRI brain/cards due to stimulator. Will medically clear, admit for syncope workup/stimulator removal.   Upon reevaluating patient updated on plan patient stated that she felt more nauseous and was having increased respiratory rate.  Vital signs are otherwise stable.  Her abdomen was tender and she is also complained of a headache.  Will get CT head to make sure no evidence intracranial hemorrhage, CT PE to make sure evidence of pulmonary embolism CT abdomen to make  sure no AAA arising he also could be causing her syncopal episode.  Patient reported severe claustrophobia therefore patient was given 1 mg of Ativan to help facilitate CT  CT imaging is negative.  We will discussed with the hospital team for admission for cardiac monitoring and to hopefully get clearance to have her stimulator removed given she will need further work-up with potential MRIs         ____________________________________________   FINAL CLINICAL IMPRESSION(S) / ED DIAGNOSES   Final diagnoses:  Syncope, unspecified syncope type     MEDICATIONS GIVEN DURING THIS VISIT:  Medications  sodium chloride 0.9 % bolus 500 mL (0 mLs Intravenous Stopped 05/13/21 0923)  ondansetron (ZOFRAN) injection 4 mg (4 mg Intravenous Given 05/13/21 0923)  iohexol (OMNIPAQUE) 350 MG/ML injection 100 mL (100 mLs Intravenous Contrast Given 05/13/21 1001)  LORazepam (ATIVAN) injection 1 mg (1 mg Intravenous Not Given 05/13/21 1054)     ED Discharge Orders     None        Note:  This document was prepared using Dragon voice recognition software and may include unintentional dictation errors.    Vanessa Gaines, MD 05/13/21 1120

## 2021-05-13 NOTE — OR Nursing (Signed)
Patient to room 14 sds; patient states "im not feeling well, I have chest pain, feeling weak, and I have the worst headache of my life".  Patient noted diaphoretic; slumped over in chair.  Patient says she needs further cardiac testing which will require removal of her spinal cord stimulator for which she has shown up for today.  Patient required assistance x 3 to get from the chair into stretcher; patient non verbal at times; responds to verbal stimuli.  EKG completed; lab work obtained; iv estrablished; 02 applied via [place]; Dr. Lacinda Axon at bedside; pt taken to ED; while transporting patient to ER patient was noted unresponsive for several seconds; patient to ED room 3 report given to MD.at bedside.

## 2021-05-13 NOTE — ED Notes (Signed)
Admitting Doc at bedside.

## 2021-05-14 ENCOUNTER — Other Ambulatory Visit: Payer: Self-pay | Admitting: Neurosurgery

## 2021-05-14 DIAGNOSIS — R55 Syncope and collapse: Secondary | ICD-10-CM | POA: Diagnosis not present

## 2021-05-14 DIAGNOSIS — I10 Essential (primary) hypertension: Secondary | ICD-10-CM | POA: Diagnosis not present

## 2021-05-14 LAB — CBC
HCT: 32 % — ABNORMAL LOW (ref 36.0–46.0)
Hemoglobin: 10.8 g/dL — ABNORMAL LOW (ref 12.0–15.0)
MCH: 30.1 pg (ref 26.0–34.0)
MCHC: 33.8 g/dL (ref 30.0–36.0)
MCV: 89.1 fL (ref 80.0–100.0)
Platelets: 241 10*3/uL (ref 150–400)
RBC: 3.59 MIL/uL — ABNORMAL LOW (ref 3.87–5.11)
RDW: 13.2 % (ref 11.5–15.5)
WBC: 4.1 10*3/uL (ref 4.0–10.5)
nRBC: 0 % (ref 0.0–0.2)

## 2021-05-14 MED ORDER — MECLIZINE HCL 25 MG PO TABS
25.0000 mg | ORAL_TABLET | Freq: Once | ORAL | Status: AC
  Administered 2021-05-14: 25 mg via ORAL
  Filled 2021-05-14: qty 1

## 2021-05-14 NOTE — Progress Notes (Signed)
Progress Note  Patient Name: [name] Date of Encounter: 05/14/2021  Primary Cardiologist: Pixie Casino, MD  Subjective   Constant, mild midsternal chest pain since admission.  Worse w/ palpation over sternum.  Pain moved under L breast and was assoc w/ L arm numbness overnight, but now just minimal central chest discomfort and tenderness.  Lightheadedness has resolved.  Eager to go home and reschedule extraction of spinal stimulator.  Inpatient Medications    Scheduled Meds:  atorvastatin  20 mg Oral QHS   carvedilol  25 mg Oral BID WC   cholecalciferol  1,000 Units Oral Daily   DULoxetine  60 mg Oral Daily   gabapentin  400 mg Oral TID   heparin  5,000 Units Subcutaneous Q8H   irbesartan  150 mg Oral Daily   And   hydrochlorothiazide  12.5 mg Oral Daily   Continuous Infusions:  sodium chloride 75 mL/hr at 05/14/21 0317   PRN Meds: acetaminophen, ALPRAZolam, baclofen, diphenhydrAMINE, hydrALAZINE, meclizine, methocarbamol, ondansetron (ZOFRAN) IV, traMADol, traZODone   Vital Signs    Vitals:   05/13/21 2200 05/13/21 2300 05/14/21 0343 05/14/21 0746  BP:   120/65 123/68  Pulse: 65 71 64 63  Resp: 15 16 17    Temp:   (!) 97.4 F (36.3 C) 97.8 F (36.6 C)  TempSrc:    Oral  SpO2: 97% 97% 100% 100%  Weight:      Height:        Intake/Output Summary (Last 24 hours) at 05/14/2021 0958 Last data filed at 05/14/2021 0923 Gross per 24 hour  Intake 1900.83 ml  Output --  Net 1900.83 ml   Filed Weights   05/13/21 0725  Weight: 76.7 kg    Physical Exam   GEN: Well nourished, well developed, in no acute distress.  HEENT: Grossly normal.  Neck: Supple, no JVD, carotid bruits, or masses. Cardiac: RRR, no murmurs, rubs, or gallops. No clubbing, cyanosis, edema.  Radials 2+, DP/PT 2+ and equal bilaterally.  Respiratory:  Respirations regular and unlabored, clear to auscultation bilaterally. GI: Soft, nontender, nondistended, BS + x 4. MS: no  deformity or atrophy. Skin: warm and dry, no rash. Neuro:  Strength and sensation are intact. Psych: AAOx3.  Normal affect.  Labs    Chemistry Recent Labs  Lab 05/13/21 0642  NA 136  K 3.6  CL 101  CO2 27  GLUCOSE 107*  BUN 15  CREATININE 0.99  CALCIUM 8.9  PROT 6.9  ALBUMIN 3.6  AST 20  ALT 17  ALKPHOS 52  BILITOT 0.5  GFRNONAA >60  ANIONGAP 8     Hematology Recent Labs  Lab 05/13/21 0642 05/14/21 0539  WBC 3.5* 4.1  RBC 3.96 3.59*  HGB 11.8* 10.8*  HCT 35.2* 32.0*  MCV 88.9 89.1  MCH 29.8 30.1  MCHC 33.5 33.8  RDW 13.3 13.2  PLT 257 241    Cardiac Enzymes  Recent Labs  Lab 05/13/21 0642 05/13/21 0924  TROPONINIHS 3 3      BNP Recent Labs  Lab 05/13/21 0642  BNP 12.1     Lipids  Lab Results  Component Value Date   CHOL 198 04/20/2020   HDL 61.70 04/20/2020   LDLCALC 123 (H) 04/20/2020   TRIG 62.0 04/20/2020   CHOLHDL 3 04/20/2020    HbA1c  Lab Results  Component Value Date   HGBA1C 5.5 03/06/2018    Radiology    CT Head Wo Contrast  Result Date: 05/13/2021 CLINICAL DATA:  Altered mental status and headaches, initial encounter EXAM: CT HEAD WITHOUT CONTRAST TECHNIQUE: Contiguous axial images were obtained from the base of the skull through the vertex without intravenous contrast. COMPARISON:  12/14/2020 FINDINGS: Brain: No evidence of acute infarction, hemorrhage, hydrocephalus, extra-axial collection or mass lesion/mass effect. Vascular: No hyperdense vessel or unexpected calcification. Skull: Normal. Negative for fracture or focal lesion. Sinuses/Orbits: No acute finding. Other: None. IMPRESSION: No acute intracranial abnormality noted. Electronically Signed   By: Inez Catalina M.D.   On: 05/13/2021 10:54   CT Angio Chest PE W and/or Wo Contrast  Result Date: 05/13/2021 CLINICAL DATA:  Weakness, headache, chest pain, abdominal distension EXAM: CT ANGIOGRAPHY CHEST CT ABDOMEN AND PELVIS WITH CONTRAST TECHNIQUE: Multidetector CT  imaging of the chest was performed using the standard protocol during bolus administration of intravenous contrast. Multiplanar CT image reconstructions and MIPs were obtained to evaluate the vascular anatomy. Multidetector CT imaging of the abdomen and pelvis was performed using the standard protocol during bolus administration of intravenous contrast. CONTRAST:  122mL OMNIPAQUE IOHEXOL 350 MG/ML SOLN IV. No oral contrast. COMPARISON:  CT abdomen and pelvis 03/10/2002, CT angio chest 04/24/2009 FINDINGS: CTA CHEST FINDINGS Cardiovascular: Aorta normal caliber without aneurysm or dissection. Heart unremarkable. No pericardial effusion. Pulmonary arteries adequately opacified and patent. No evidence of pulmonary embolism. Mediastinum/Nodes: Esophagus unremarkable. Base of cervical region normal appearance. No thoracic adenopathy. Lungs/Pleura: Dependent atelectasis in the posterior lower lobes slightly greater on RIGHT. Lungs otherwise clear. No pulmonary infiltrate, pleural effusion, or pneumothorax. Musculoskeletal: Unremarkable Review of the MIP images confirms the above findings. CT ABDOMEN and PELVIS FINDINGS Hepatobiliary: Gallbladder and liver normal appearance Pancreas: Normal appearance Spleen: Normal appearance Adrenals/Urinary Tract: Adrenal glands, kidneys, ureters, and bladder normal appearance Stomach/Bowel: Normal appendix. Large and small bowel loops unremarkable. Stomach decompressed, with suboptimally assessed wall thickness Vascular/Lymphatic: Atherosclerotic calcifications aorta and iliac arteries without aneurysm. Vascular structures patent. No adenopathy. Reproductive: Pessary in vagina. Uterus surgically absent. Nonvisualization of ovaries. Other: No free air or free fluid. No hernia or inflammatory process. Musculoskeletal: Intraspinal stimulator at caudal aspect of thoracic spine. Scattered Schmorl's nodes. No acute osseous findings. Review of the MIP images confirms the above findings.  IMPRESSION: No evidence of pulmonary embolism. Dependent atelectasis in the posterior lower lobes slightly greater on RIGHT. No acute intra-abdominal or intrapelvic abnormalities. Aortic Atherosclerosis (ICD10-I70.0). Electronically Signed   By: Lavonia Dana M.D.   On: 05/13/2021 11:08   CT ABDOMEN PELVIS W CONTRAST  Result Date: 05/13/2021 CLINICAL DATA:  Weakness, headache, chest pain, abdominal distension EXAM: CT ANGIOGRAPHY CHEST CT ABDOMEN AND PELVIS WITH CONTRAST TECHNIQUE: Multidetector CT imaging of the chest was performed using the standard protocol during bolus administration of intravenous contrast. Multiplanar CT image reconstructions and MIPs were obtained to evaluate the vascular anatomy. Multidetector CT imaging of the abdomen and pelvis was performed using the standard protocol during bolus administration of intravenous contrast. CONTRAST:  144mL OMNIPAQUE IOHEXOL 350 MG/ML SOLN IV. No oral contrast. COMPARISON:  CT abdomen and pelvis 03/10/2002, CT angio chest 04/24/2009 FINDINGS: CTA CHEST FINDINGS Cardiovascular: Aorta normal caliber without aneurysm or dissection. Heart unremarkable. No pericardial effusion. Pulmonary arteries adequately opacified and patent. No evidence of pulmonary embolism. Mediastinum/Nodes: Esophagus unremarkable. Base of cervical region normal appearance. No thoracic adenopathy. Lungs/Pleura: Dependent atelectasis in the posterior lower lobes slightly greater on RIGHT. Lungs otherwise clear. No pulmonary infiltrate, pleural effusion, or pneumothorax. Musculoskeletal: Unremarkable Review of the MIP images confirms the above findings. CT ABDOMEN and PELVIS FINDINGS Hepatobiliary:  Gallbladder and liver normal appearance Pancreas: Normal appearance Spleen: Normal appearance Adrenals/Urinary Tract: Adrenal glands, kidneys, ureters, and bladder normal appearance Stomach/Bowel: Normal appendix. Large and small bowel loops unremarkable. Stomach decompressed, with suboptimally  assessed wall thickness Vascular/Lymphatic: Atherosclerotic calcifications aorta and iliac arteries without aneurysm. Vascular structures patent. No adenopathy. Reproductive: Pessary in vagina. Uterus surgically absent. Nonvisualization of ovaries. Other: No free air or free fluid. No hernia or inflammatory process. Musculoskeletal: Intraspinal stimulator at caudal aspect of thoracic spine. Scattered Schmorl's nodes. No acute osseous findings. Review of the MIP images confirms the above findings. IMPRESSION: No evidence of pulmonary embolism. Dependent atelectasis in the posterior lower lobes slightly greater on RIGHT. No acute intra-abdominal or intrapelvic abnormalities. Aortic Atherosclerosis (ICD10-I70.0). Electronically Signed   By: Lavonia Dana M.D.   On: 05/13/2021 11:08   DG Chest Portable 1 View  Result Date: 05/13/2021 CLINICAL DATA:  Chest tightness and pain. EXAM: PORTABLE CHEST 1 VIEW COMPARISON:  To 1822 FINDINGS: 0737 hours. The lungs are clear without focal pneumonia, edema, pneumothorax or pleural effusion. Cardiopericardial silhouette is at upper limits of normal for size. The visualized bony structures of the thorax show no acute abnormality. Thoracic spinal stimulator device again noted. Telemetry leads overlie the chest. IMPRESSION: Stable exam. No acute cardiopulmonary findings. Electronically Signed   By: Misty Stanley M.D.   On: 05/13/2021 08:59    Telemetry    RSR, occasional mobitz I w/o prolonged pauses or higher grades of heart block - Personally Reviewed  Cardiac Studies     03/2015 Cath: nl cors   11/2020 Echo: EF 65-70%, no rwma, sev asymm LVH with IVSd 1.9 cm. No LVOT obs @ rest. Gr1 DD. Triv MR.   03/2021 Cor CTA: Ca2+ = 0. Nl Cors.  Patient Profile     60 y.o. female with a history of chest pain and normal coronary arteries, syncope, HTN, HL, LVH w/ ? Of HCM, diastolic dysfunction, chronic back pain s/p spinal stimulator and GERD, who was admitted 7/18 due to c/p and  presyncope.  Assessment & Plan    1.  Non-cardiac Chest Pain:  Pt w/ a long h/o atypical chest pain and nl cors on cath in 2016 and more recently, nl cors and Ca2+ score of zero on coronary CTA in June.  Despite these findings, she has continued to have intermittent chest pain @ rest, which is often constant.  She began having recurrent c/p on 7/17, assoc w/ malaise and lightheadedness.  This has persisted and prompted admission 7/18.  Pain is currently mild and substernal - reproducible w/ palpation.  HsTrops nl.  In light of non-cardiac nature of Ss w/ prev nl w/u, no further ischemic w/u warranted at this time.  2.  Presyncope/Syncope/LVH:   Pt w/ syncope in Feb and finding of LVH w/ ? HCM.  Zio monitoring @ that time showed no significant arrhythmias.  Was having constant lightheadedness and chest pain beginning 7/17 w/ ? Syncope en route to ED from preop on 7/18.  She has been hypertensive throughout ED stay/hospitalization.  She has had occasional Mobitz 1, though no sustained pauses, bradycardia, or other arrhythmias.  Eventual plan for cMRI.  3.  Essential HTN:  BP stable this AM.  4.  Back pain/preop cardiovascular exam:  See #1.  No further ischemic eval warranted.  Hydrate throughout perioperative period.  Signed, Murray Hodgkins, NP  05/14/2021, 9:58 AM    For questions or updates, please contact   Please consult [email or web address] for contact info  under Cardiology/STEMI.

## 2021-05-14 NOTE — Discharge Summary (Signed)
Physician Discharge Summary  Patient ID: [name] MRN: [id number] DOB/AGE: 27-May-1961 60 y.o.  Admit date: 05/13/2021 Discharge date: 05/14/2021  Admission Diagnoses:  Discharge Diagnoses:  Principal Problem:   Syncope Active Problems:   Essential hypertension   Hyperlipidemia   Depression with anxiety   Chronic diastolic CHF (congestive heart failure) (HCC)   Chronic back pain   Chest tightness   Lower abdominal pain   Discharged Condition: good  Hospital Course:  [name] is a 60 y.o. female with medical history significant of possible hypertrophic cardiomyopathy, HTN, HLD, dCHF, chronic back pain, BPPV, depression with anxiety, right foot drop, who presents with syncope.  Patient was scheduled to have spinal stimulator removed in order to perform a cardiac MRI.  Before the procedure, patient had an episode of syncope. Been seen by cardiology, telemetry did not show any arrhythmia.  Thought to be secondary to multifactorial including dehydration.  At this point, patient is medically stable to be discharged.  I discussed with Dr. Lacinda Axon, the procedure is rescheduled on Monday, he will call patient to reschedule.   Consults: cardiology  Significant Diagnostic Studies:   CT ANGIOGRAPHY CHEST   CT ABDOMEN AND PELVIS WITH CONTRAST   TECHNIQUE: Multidetector CT imaging of the chest was performed using the standard protocol during bolus administration of intravenous contrast. Multiplanar CT image reconstructions and MIPs were obtained to evaluate the vascular anatomy. Multidetector CT imaging of the abdomen and pelvis was performed using the standard protocol during bolus administration of intravenous contrast.   CONTRAST:  130mL OMNIPAQUE IOHEXOL 350 MG/ML SOLN IV. No oral contrast.   COMPARISON:  CT abdomen and pelvis 03/10/2002, CT angio chest 04/24/2009   FINDINGS: CTA CHEST FINDINGS   Cardiovascular: Aorta normal caliber without  aneurysm or dissection. Heart unremarkable. No pericardial effusion. Pulmonary arteries adequately opacified and patent. No evidence of pulmonary embolism.   Mediastinum/Nodes: Esophagus unremarkable. Base of cervical region normal appearance. No thoracic adenopathy.   Lungs/Pleura: Dependent atelectasis in the posterior lower lobes slightly greater on RIGHT. Lungs otherwise clear. No pulmonary infiltrate, pleural effusion, or pneumothorax.   Musculoskeletal: Unremarkable   Review of the MIP images confirms the above findings.   CT ABDOMEN and PELVIS FINDINGS   Hepatobiliary: Gallbladder and liver normal appearance   Pancreas: Normal appearance   Spleen: Normal appearance   Adrenals/Urinary Tract: Adrenal glands, kidneys, ureters, and bladder normal appearance   Stomach/Bowel: Normal appendix. Large and small bowel loops unremarkable. Stomach decompressed, with suboptimally assessed wall thickness   Vascular/Lymphatic: Atherosclerotic calcifications aorta and iliac arteries without aneurysm. Vascular structures patent. No adenopathy.   Reproductive: Pessary in vagina. Uterus surgically absent. Nonvisualization of ovaries.   Other: No free air or free fluid. No hernia or inflammatory process.   Musculoskeletal: Intraspinal stimulator at caudal aspect of thoracic spine. Scattered Schmorl's nodes. No acute osseous findings.   Review of the MIP images confirms the above findings.   IMPRESSION: No evidence of pulmonary embolism.   Dependent atelectasis in the posterior lower lobes slightly greater on RIGHT.   No acute intra-abdominal or intrapelvic abnormalities.   Aortic Atherosclerosis (ICD10-I70.0).     Electronically Signed   By: Lavonia Dana M.D.   On: 05/13/2021 11:08  Treatments: Clinical monitoring.  Discharge Exam: Blood pressure 123/68, pulse 63, temperature 97.8 F (36.6 C), temperature source Oral, resp. rate 17, height 5\' 5"  (1.651 m), weight 76.7  kg, SpO2 100 %. General appearance: alert and cooperative Resp: clear to auscultation bilaterally Cardio: regular  rate and rhythm, S1, S2 normal, no murmur, click, rub or gallop GI: soft, non-tender; bowel sounds normal; no masses,  no organomegaly Extremities: extremities normal, atraumatic, no cyanosis or edema  Disposition: Discharge disposition: 01-Home or Self Care       Discharge Instructions     Diet - low sodium heart healthy   Complete by: As directed    Increase activity slowly   Complete by: As directed       Allergies as of 05/14/2021       Reactions   Cephalosporins Anaphylaxis   Penicillins Anaphylaxis, Hives   Did it involve swelling of the face/tongue/throat, SOB, or low BP? Yes Did it involve sudden or severe rash/hives, skin peeling, or any reaction on the inside of your mouth or nose? Yes Did you need to seek medical attention at a hospital or doctor's office? Yes When did it last happen?      10+ years If all above answers are "NO", may proceed with cephalosporin use.   Anesthetics, Amide Nausea And Vomiting   Does not know name of it. States they put it on record foot center.    Betadine [povidone Iodine] Other (See Comments)   "Skin Burn" caused scar on Left buttock   Latex Hives, Itching, Rash   Peach [prunus Persica] Hives   Claritin [loratadine]    Rash, pruritis        Medication List     TAKE these medications    ALPRAZolam 0.5 MG tablet Commonly known as: XANAX Take 1/2 to 1 tablet (0.25-0.5 mg total) by mouth at bedtime as needed for anxiety.   atorvastatin 20 MG tablet Commonly known as: LIPITOR TAKE 1 TABLET BY MOUTH ONCE DAILY What changed: how much to take   baclofen 10 MG tablet Commonly known as: LIORESAL TAKE 1 TABLET BY MOUTH THREE TIMES DAILY AS NEEDED What changed:  how much to take when to take this reasons to take this   carvedilol 25 MG tablet Commonly known as: COREG Take 1 tablet (25 mg total) by mouth 2  (two) times daily with a meal.   cholecalciferol 25 MCG (1000 UNIT) tablet Commonly known as: VITAMIN D3 Take 1,000 Units by mouth daily.   diphenhydrAMINE 25 MG tablet Commonly known as: BENADRYL Take 25 mg by mouth daily as needed for itching.   DULoxetine 60 MG capsule Commonly known as: Cymbalta Take 1 capsule (60 mg total) by mouth daily.   Femring 0.05 MG/24HR Ring Generic drug: Estradiol Acetate USE ONE RING VAGINALLY EVERY 3 MONTHS AS DIRECTED What changed: when to take this   gabapentin 400 MG capsule Commonly known as: NEURONTIN TAKE 1 CAPSULE BY MOUTH 3 TIMES DAILY What changed:  how much to take how to take this when to take this   meclizine 25 MG tablet Commonly known as: ANTIVERT TAKE 1 TABLET BY MOUTH 2 TIMES DAILY AS NEEDED FOR DIZZINESS What changed:  how much to take how to take this when to take this reasons to take this   methocarbamol 500 MG tablet Commonly known as: ROBAXIN TAKE 1 TABLET BY MOUTH AT BEDTIME AS NEEDED What changed:  how much to take reasons to take this   Pfizer-BioNT COVID-19 Vac-TriS Susp injection Generic drug: COVID-19 mRNA Vac-TriS (Pfizer)   telmisartan-hydrochlorothiazide 40-12.5 MG tablet Commonly known as: Micardis HCT Take 1 tablet by mouth daily.   traMADol 50 MG tablet Commonly known as: ULTRAM TAKE 1 TABLET BY MOUTH 3 TIMES DAILY AS NEEDED FOR  PAIN        Follow-up Information     [name], [name], MD Follow up in 1 week(s).   Specialty: Family Medicine Contact information: [place] [address] [phone]         Pixie Casino, MD Follow up in 1 week(s).   Specialty: Cardiology Contact information: [address] [name] [address] [phone]         Pixie Casino, MD .   Specialty: Cardiology Contact information: [address] [name] [address] [phone]                 Signed: Sharen Hones 05/14/2021, 10:01 AM

## 2021-05-15 ENCOUNTER — Other Ambulatory Visit (HOSPITAL_COMMUNITY): Payer: Self-pay

## 2021-05-17 ENCOUNTER — Other Ambulatory Visit: Payer: Self-pay

## 2021-05-17 ENCOUNTER — Encounter: Payer: Self-pay | Admitting: Family Medicine

## 2021-05-17 ENCOUNTER — Ambulatory Visit (INDEPENDENT_AMBULATORY_CARE_PROVIDER_SITE_OTHER): Payer: PPO | Admitting: Family Medicine

## 2021-05-17 VITALS — BP 130/80 | HR 68 | Resp 16 | Ht 65.0 in

## 2021-05-17 DIAGNOSIS — I10 Essential (primary) hypertension: Secondary | ICD-10-CM | POA: Diagnosis not present

## 2021-05-17 DIAGNOSIS — R55 Syncope and collapse: Secondary | ICD-10-CM

## 2021-05-17 DIAGNOSIS — D649 Anemia, unspecified: Secondary | ICD-10-CM

## 2021-05-17 DIAGNOSIS — F419 Anxiety disorder, unspecified: Secondary | ICD-10-CM | POA: Diagnosis not present

## 2021-05-17 DIAGNOSIS — M21371 Foot drop, right foot: Secondary | ICD-10-CM | POA: Diagnosis not present

## 2021-05-17 NOTE — Progress Notes (Signed)
Established Patient Office Visit  Subjective:  Patient ID: [name], female    DOB: Apr 07, 1961  Age: 60 y.o. MRN: [id number]  CC:  Chief Complaint  Patient presents with   Hospitalization Follow-up   HPI [name] presents for hospital follow up.  She was admitted on 05/13/21 and discharged on 05/14/21. She was scheduled to have spinal stimulator removed, so she can have cardiac MRI. When she was getting prepared for procedure, she started feeling lightheaded and had episode of syncope. Procedure was cancelled and re-scheduled for 05/27/21. Cardiology consultation during hospitalization.  Work up otherwise negative except for mild anemia. Telemetry did not show arrhythmia. It was determined that episode of syncope was due to dehydration and stress reaction. She is not driving.  She is feeling better, "little dizzy" at times. Anxiety worse since the death of her brother. In general she feels like she is dealing well with stress. She feels like she needs some rest and try to relax. She is sleeping better since she started back on Alprazolam 0.5 mg at bedtime. She is also on Duloxetine 60 mg daily.  HTN on Carvedilol 25 mg bid and Telmisartan-HCTZ 40-12.5 mg daily.  Negative for severe/frequent headache,visual changes, CP,SOB,or palpitation. She is drinking fluids. She has not noted fever,urinary symptoms,or edema.  Chest CTA and abdominal CT:  No evidence of pulmonary embolism.  Dependent atelectasis in the posterior lower lobes slightly greater on RIGHT. No acute intra-abdominal or intrapelvic abnormalities. Aortic Atherosclerosis (ICD10-I70.0).  Past Medical History:  Diagnosis Date   Anxiety    Aortic atherosclerosis ([place]) 04/16/2021   Atypical angina (HCC)    Back pain    related to spinal stenosis and disc problem, radiates down left buttocks to leg., weakness occ.   Chest pain    a. 03/2015 Cath: nl cors; b. 03/2021 Cor CTA: Ca2+ = 0.  Nl Cors.   Chronic pain syndrome    Dyspnea    GERD (gastroesophageal reflux disease)    Grade I diastolic dysfunction    Headache    Hyperlipidemia    Hypertension    Lumbar post-laminectomy syndrome    LVH (left ventricular hypertrophy) 12/15/2020   a. 11/2020 Echo: EF 65-70%, no rwma, sev asymm LVH with IVSd 1.9 cm. No LVOT obs @ rest. Gr1 DD. Triv MR.   PONV (postoperative nausea and vomiting)    Pulmonary nodules    a. 03/2021 CT Chest: 22mm pulm nodules in bilat lower lobes. F/u 1 yr.   Right foot drop    Syncope    a. 11/2020 Zio: No significant arrhythmias.   Vaginal foreign object    "Uses Femring"    Past Surgical History:  Procedure Laterality Date   ABDOMINAL HYSTERECTOMY     BIOPSY  12/16/2020   Procedure: BIOPSY;  Surgeon: Ladene Artist, MD;  Location: [facility];  Service: Endoscopy;;   CARDIAC CATHETERIZATION N/A 04/18/2015   Procedure: Left Heart Cath and Coronary Angiography;  Surgeon: [name], MD;  Location: [place] [facility];  Service: Cardiovascular;  Laterality: N/A;   COLONOSCOPY W/ BIOPSIES AND POLYPECTOMY     ESOPHAGOGASTRODUODENOSCOPY (EGD) WITH PROPOFOL N/A 12/16/2020   Procedure: ESOPHAGOGASTRODUODENOSCOPY (EGD) WITH PROPOFOL;  Surgeon: Ladene Artist, MD;  [facility] ENDOSCOPY;  Service: Endoscopy;  Laterality: N/A;   FOOT SURGERY Bilateral    [place] "bunion,bone spur, tendon" (1) -6'16, (1)-10'16   IR EPIDUROGRAPHY  07/21/2018   LUMBAR LAMINECTOMY/DECOMPRESSION MICRODISCECTOMY Bilateral 12/28/2015   Procedure: MICRO LUMBAR  DECOMPRESSION L4 - L5 BILATERALLY;  Surgeon: Jene Every, MD;  Location: [facility];  Service: Orthopedics;  Laterality: Bilateral;   LUMBAR LAMINECTOMY/DECOMPRESSION MICRODISCECTOMY Bilateral 03/04/2018   Procedure: Revision of Microlumbar Decompression Bilateral Lumbar Four-Five;  Surgeon: Jene Every, MD;  Location: [facility];  Service: Orthopedics;  Laterality: Bilateral;  90 mins   SAVORY DILATION N/A 12/16/2020    Procedure: SAVORY DILATION;  Surgeon: Meryl Dare, MD;  Location: [facility] ENDOSCOPY;  Service: Endoscopy;  Laterality: N/A;   SPINAL CORD STIMULATOR INSERTION N/A 09/28/2019   Procedure: THORACIC SPINAL CORD STIMULATOR INSERTION;  Surgeon: Venita Lick, MD;  Location: [facility];  Service: Orthopedics;  Laterality: N/A;  2.5 hrs   TUBAL LIGATION     WISDOM TOOTH EXTRACTION     WOUND EXPLORATION N/A 03/04/2018   Procedure: EXPLORATION OF LUMBAR DECOMPRESSION WOUND;  Surgeon: Jene Every, MD;  Location: [facility];  Service: Orthopedics;  Laterality: N/A;    Family History  Problem Relation Age of Onset   Heart attack Mother    Lung cancer Father    Cancer Father    Pancreatic cancer Sister    Breast cancer Sister 76   Throat cancer Brother    Multiple myeloma Sister    Breast cancer Sister        diagnosed in her 34's   Heart attack Sister    Stomach cancer Cousin    Colon cancer Neg Hx     Social History   Socioeconomic History   Marital status: Married    Spouse name: Not on file   Number of children: Not on file   Years of education: Not on file   Highest education level: Not on file  Occupational History   Not on file  Tobacco Use   Smoking status: Never   Smokeless tobacco: Never  Vaping Use   Vaping Use: Never used  Substance and Sexual Activity   Alcohol use: No   Drug use: No   Sexual activity: Yes    Partners: Male    Birth control/protection: Post-menopausal  Other Topics Concern   Not on file  Social History Narrative   Not on file   Social Determinants of Health   Financial Resource Strain: Not on file  Food Insecurity: Not on file  Transportation Needs: Not on file  Physical Activity: Not on file  Stress: Not on file  Social Connections: Not on file  Intimate Partner Violence: Not on file   Outpatient Medications Prior to Visit  Medication Sig Dispense Refill   ALPRAZolam (XANAX) 0.5 MG tablet Take 1/2 to 1 tablet (0.25-0.5 mg total) by mouth at  bedtime as needed for anxiety. 30 tablet 1   atorvastatin (LIPITOR) 20 MG tablet TAKE 1 TABLET BY MOUTH ONCE DAILY 90 tablet 3   baclofen (LIORESAL) 10 MG tablet TAKE 1 TABLET BY MOUTH THREE TIMES DAILY AS NEEDED 90 tablet 3   carvedilol (COREG) 25 MG tablet Take 1 tablet (25 mg total) by mouth 2 (two) times daily with a meal. 180 tablet 3   cholecalciferol (VITAMIN D3) 25 MCG (1000 UNIT) tablet Take 1,000 Units by mouth daily.     diphenhydrAMINE (BENADRYL) 25 MG tablet Take 25 mg by mouth daily as needed for itching.     DULoxetine (CYMBALTA) 60 MG capsule Take 1 capsule (60 mg total) by mouth daily. 90 capsule 2   Estradiol Acetate (FEMRING) 0.05 MG/24HR RING USE ONE RING VAGINALLY EVERY 3 MONTHS AS DIRECTED 1 each 2  gabapentin (NEURONTIN) 400 MG capsule TAKE 1 CAPSULE BY MOUTH 3 TIMES DAILY 90 capsule 3   meclizine (ANTIVERT) 25 MG tablet TAKE 1 TABLET BY MOUTH 2 TIMES DAILY AS NEEDED FOR DIZZINESS 20 tablet 0   methocarbamol (ROBAXIN) 500 MG tablet TAKE 1 TABLET BY MOUTH AT BEDTIME AS NEEDED 90 tablet 0   PFIZER-BIONT COVID-19 VAC-TRIS SUSP injection      telmisartan-hydrochlorothiazide (MICARDIS HCT) 40-12.5 MG tablet Take 1 tablet by mouth daily. 90 tablet 3   traMADol (ULTRAM) 50 MG tablet TAKE 1 TABLET BY MOUTH 3 TIMES DAILY AS NEEDED FOR PAIN 90 tablet 0   No facility-administered medications prior to visit.    Allergies  Allergen Reactions   Cephalosporins Anaphylaxis   Penicillins Anaphylaxis and Hives    Did it involve swelling of the face/tongue/throat, SOB, or low BP? Yes Did it involve sudden or severe rash/hives, skin peeling, or any reaction on the inside of your mouth or nose? Yes Did you need to seek medical attention at a hospital or doctor's office? Yes When did it last happen?      10+ years If all above answers are "NO", may proceed with cephalosporin use.     Anesthetics, Amide Nausea And Vomiting    Does not know name of it. States they put it on record foot  center.    Betadine [Povidone Iodine] Other (See Comments)    "Skin Burn" caused scar on Left buttock   Latex Hives, Itching and Rash   Peach [Prunus Persica] Hives   Claritin [Loratadine]     Rash, pruritis    Review of Systems  Constitutional:  Positive for fatigue. Negative for appetite change.  HENT:  Negative for mouth sores, nosebleeds and sore throat.   Respiratory:  Negative for cough and wheezing.   Gastrointestinal:  Negative for abdominal pain, nausea and vomiting.       Negative for changes in bowel habits.  Genitourinary:  Negative for decreased urine volume, dysuria and hematuria.  Neurological:  Negative for facial asymmetry.    Objective:   BP 130/80   Pulse 68   Resp 16   Ht $R'5\' 5"'vU$  (1.651 m)   SpO2 99%   BMI 28.12 kg/m   Physical Exam Constitutional:      General: She is not in acute distress.    Appearance: She is well-developed.  HENT:     Head: Normocephalic and atraumatic.  Eyes:     Conjunctiva/sclera: Conjunctivae normal.     Pupils: Pupils are equal, round, and reactive to light.  Cardiovascular:     Rate and Rhythm: Normal rate and regular rhythm.     Heart sounds: No murmur heard. Pulmonary:     Effort: Pulmonary effort is normal.     Breath sounds: Normal breath sounds. No wheezing or rales.  Chest:     Chest wall: No tenderness.  Abdominal:     Palpations: Abdomen is soft.     Tenderness: There is no abdominal tenderness.  Neurological:     Mental Status: She is alert and oriented to person, place, and time.     Motor: Atrophy (RLE) present.     Coordination: Coordination normal.     Comments: Gait assisted by a walker, right foot drop.  Psychiatric:        Mood and Affect: Mood is anxious.   There are no preventive care reminders to display for this patient.  Lab Results  Component Value Date   TSH 3.361 05/13/2021  Lab Results  Component Value Date   WBC 4.1 05/14/2021   HGB 10.8 (L) 05/14/2021   HCT 32.0 (L) 05/14/2021    MCV 89.1 05/14/2021   PLT 241 05/14/2021   Lab Results  Component Value Date   NA 136 05/13/2021   K 3.6 05/13/2021   CO2 27 05/13/2021   GLUCOSE 107 (H) 05/13/2021   BUN 15 05/13/2021   CREATININE 0.99 05/13/2021   BILITOT 0.5 05/13/2021   ALKPHOS 52 05/13/2021   AST 20 05/13/2021   ALT 17 05/13/2021   PROT 6.9 05/13/2021   ALBUMIN 3.6 05/13/2021   CALCIUM 8.9 05/13/2021   ANIONGAP 8 05/13/2021   EGFR 57 (L) 04/08/2021   GFR 56.92 (L) 12/14/2020     Assessment & Plan:   Problem List Items Addressed This Visit       Cardiovascular and Mediastinum   Essential hypertension BP adequately controlled. Continue same dose of Carvedilol,Telmisartan,and HCTZ. Low salt diet. Monitor BP regularly.     Other   Syncope - Primary Adequate hydration. No driving for 6 months. Instructed about warning signs.   Other Visit Diagnoses     Anxiety disorder, unspecified type     Stable otherwise. Continue Alprazolam 0.5 mg at bedtimes prn and Duloxetine 60 mg daily. Instructed about warning signs.    Anemia, unspecified type     ?Dilutional.Will re-check next visit. Colonoscopy in 08/2017. Will plan on rechecking CBC next visit.    Follow-up: Return if symptoms worsen or fail to improve, for Keep next appt.    Monigue Spraggins [name], MD

## 2021-05-17 NOTE — Patient Instructions (Addendum)
A few things to remember from today's visit:  Syncope, unspecified syncope type  Essential hypertension  Anxiety disorder, unspecified type  Anemia, unspecified type  If you need refills please call your pharmacy. Do not use My Chart to request refills or for acute issues that need immediate attention.   No changes today. Adequate hydration and fall precautions. Good luck with surgery and I see you in 09/2021, before if needed.  Please be sure medication list is accurate. If a new problem present, please set up appointment sooner than planned today.

## 2021-05-21 ENCOUNTER — Inpatient Hospital Stay: Payer: PPO | Admitting: Family Medicine

## 2021-05-22 ENCOUNTER — Ambulatory Visit: Payer: PPO | Admitting: Physical Medicine & Rehabilitation

## 2021-05-26 NOTE — Progress Notes (Deleted)
Cardiology Office Note   Date:  05/26/2021   ID:  [name], [name] [birth year], MRN [id number]  PCP:  [name], [name], MD  Cardiologist:  Dr. Debara Pickett  No chief complaint on file.    History of Present Illness: [name] is a 60 y.o. female who presents for ongoing assessment and management of syncopal episode with history of hypertension, back pain status post spinal cord stimulator, history of back surgery in 2019 with resulting right lower extremity weakness as a complication.  Was seen during recent hospitalization after a brief episode of syncope after removal of spinal cord stimulator.  Echocardiogram was completed which revealed preserved ejection fraction, coronary CTA in June 2022 showed no evidence of CAD.  It was noted that she did have a prior left heart catheterization which was normal.  She is here for follow-up to evaluate her current symptoms and assessment of recurrent syncope.    Past Medical History:  Diagnosis Date   Anxiety    Aortic atherosclerosis ([place]) 04/16/2021   Atypical angina (HCC)    Back pain    related to spinal stenosis and disc problem, radiates down left buttocks to leg., weakness occ.   Chest pain    a. 03/2015 Cath: nl cors; b. 03/2021 Cor CTA: Ca2+ = 0. Nl Cors.   Chronic pain syndrome    Dyspnea    GERD (gastroesophageal reflux disease)    Grade I diastolic dysfunction    Headache    Hyperlipidemia    Hypertension    Lumbar post-laminectomy syndrome    LVH (left ventricular hypertrophy) 12/15/2020   a. 11/2020 Echo: EF 65-70%, no rwma, sev asymm LVH with IVSd 1.9 cm. No LVOT obs @ rest. Gr1 DD. Triv MR.   PONV (postoperative nausea and vomiting)    Pulmonary nodules    a. 03/2021 CT Chest: 44mm pulm nodules in bilat lower lobes. F/u 1 yr.   Right foot drop    Syncope    a. 11/2020 Zio: No significant arrhythmias.   Vaginal foreign object    "Uses Femring"    Past Surgical History:  Procedure Laterality Date    ABDOMINAL HYSTERECTOMY     BIOPSY  12/16/2020   Procedure: BIOPSY;  Surgeon: Ladene Artist, MD;  Location: [place];  Service: Endoscopy;;   CARDIAC CATHETERIZATION N/A 04/18/2015   Procedure: Left Heart Cath and Coronary Angiography;  Surgeon: [name], MD;  Location: [place] [facility];  Service: Cardiovascular;  Laterality: N/A;   COLONOSCOPY W/ BIOPSIES AND POLYPECTOMY     ESOPHAGOGASTRODUODENOSCOPY (EGD) WITH PROPOFOL N/A 12/16/2020   Procedure: ESOPHAGOGASTRODUODENOSCOPY (EGD) WITH PROPOFOL;  Surgeon: Ladene Artist, MD;  Location: [facility] ENDOSCOPY;  Service: Endoscopy;  Laterality: N/A;   FOOT SURGERY Bilateral    [place] "bunion,bone spur, tendon" (1) -6'16, (1)-10'16   IR EPIDUROGRAPHY  07/21/2018   LUMBAR LAMINECTOMY/DECOMPRESSION MICRODISCECTOMY Bilateral 12/28/2015   Procedure: MICRO LUMBAR DECOMPRESSION L4 - L5 BILATERALLY;  Surgeon: [name], MD;  Location: [facility];  Service: Orthopedics;  Laterality: Bilateral;   LUMBAR LAMINECTOMY/DECOMPRESSION MICRODISCECTOMY Bilateral 03/04/2018   Procedure: Revision of Microlumbar Decompression Bilateral Lumbar Four-Five;  Surgeon: [name], MD;  Location: [place];  Service: Orthopedics;  Laterality: Bilateral;  90 mins   SAVORY DILATION N/A 12/16/2020   Procedure: SAVORY DILATION;  Surgeon: Ladene Artist, MD;  Location: [facility] ENDOSCOPY;  Service: Endoscopy;  Laterality: N/A;   SPINAL CORD STIMULATOR INSERTION N/A 09/28/2019   Procedure: THORACIC SPINAL CORD STIMULATOR INSERTION;  Surgeon: Melina Schools, MD;  Location: [place];  Service: Orthopedics;  Laterality: N/A;  2.5 hrs   TUBAL LIGATION     WISDOM TOOTH EXTRACTION     WOUND EXPLORATION N/A 03/04/2018   Procedure: EXPLORATION OF LUMBAR DECOMPRESSION WOUND;  Surgeon: [name], MD;  Location: [place];  Service: Orthopedics;  Laterality: N/A;     Current Outpatient Medications  Medication Sig Dispense Refill   ALPRAZolam (XANAX) 0.5 MG tablet Take 1/2 to 1 tablet  (0.25-0.5 mg total) by mouth at bedtime as needed for anxiety. 30 tablet 1   atorvastatin (LIPITOR) 20 MG tablet TAKE 1 TABLET BY MOUTH ONCE DAILY 90 tablet 3   baclofen (LIORESAL) 10 MG tablet TAKE 1 TABLET BY MOUTH THREE TIMES DAILY AS NEEDED 90 tablet 3   carvedilol (COREG) 25 MG tablet Take 1 tablet (25 mg total) by mouth 2 (two) times daily with a meal. 180 tablet 3   cholecalciferol (VITAMIN D3) 25 MCG (1000 UNIT) tablet Take 1,000 Units by mouth daily.     diphenhydrAMINE (BENADRYL) 25 MG tablet Take 25 mg by mouth daily as needed for itching.     DULoxetine (CYMBALTA) 60 MG capsule Take 1 capsule (60 mg total) by mouth daily. 90 capsule 2   Estradiol Acetate (FEMRING) 0.05 MG/24HR RING USE ONE RING VAGINALLY EVERY 3 MONTHS AS DIRECTED 1 each 2   gabapentin (NEURONTIN) 400 MG capsule TAKE 1 CAPSULE BY MOUTH 3 TIMES DAILY 90 capsule 3   meclizine (ANTIVERT) 25 MG tablet TAKE 1 TABLET BY MOUTH 2 TIMES DAILY AS NEEDED FOR DIZZINESS 20 tablet 0   methocarbamol (ROBAXIN) 500 MG tablet TAKE 1 TABLET BY MOUTH AT BEDTIME AS NEEDED 90 tablet 0   PFIZER-BIONT COVID-19 VAC-TRIS SUSP injection      telmisartan-hydrochlorothiazide (MICARDIS HCT) 40-12.5 MG tablet Take 1 tablet by mouth daily. 90 tablet 3   traMADol (ULTRAM) 50 MG tablet TAKE 1 TABLET BY MOUTH 3 TIMES DAILY AS NEEDED FOR PAIN 90 tablet 0   No current facility-administered medications for this visit.    Allergies:   Cephalosporins; Penicillins; Anesthetics, amide; Betadine [povidone iodine]; Latex; Peach [prunus persica]; and Claritin [loratadine]    Social History:  The patient  reports that she has never smoked. She has never used smokeless tobacco. She reports that she does not drink alcohol and does not use drugs.   Family History:  The patient's family history includes Breast cancer in her sister; Breast cancer (age of onset: 39) in her sister; Cancer in her father; Heart attack in her mother and sister; Lung cancer in her  father; Multiple myeloma in her sister; Pancreatic cancer in her sister; Stomach cancer in her cousin; Throat cancer in her brother.    ROS: All other systems are reviewed and negative. Unless otherwise mentioned in H&P    PHYSICAL EXAM: VS:  There were no vitals taken for this visit. , BMI There is no height or weight on file to calculate BMI. GEN: Well nourished, well developed, in no acute distress HEENT: normal Neck: no JVD, carotid bruits, or masses Cardiac: ***RRR; no murmurs, rubs, or gallops,no edema  Respiratory:  Clear to auscultation bilaterally, normal work of breathing GI: soft, nontender, nondistended, + BS MS: no deformity or atrophy Skin: warm and dry, no rash Neuro:  Strength and sensation are intact Psych: euthymic mood, full affect   EKG:  EKG {ACTION; IS/IS [facility]:[id number]} ordered today. The ekg ordered today demonstrates ***   Recent Labs: 05/13/2021: ALT  17; B Natriuretic Peptide 12.1; BUN 15; Creatinine, Ser 0.99; Magnesium 1.9; Potassium 3.6; Sodium 136; TSH 3.361 05/14/2021: Hemoglobin 10.8; Platelets 241    Lipid Panel    Component Value Date/Time   CHOL 198 04/20/2020 0955   TRIG 62.0 04/20/2020 0955   HDL 61.70 04/20/2020 0955   CHOLHDL 3 04/20/2020 0955   VLDL 12.4 04/20/2020 0955   LDLCALC 123 (H) 04/20/2020 0955      Wt Readings from Last 3 Encounters:  05/13/21 169 lb (76.7 kg)  05/03/21 169 lb (76.7 kg)  05/03/21 169 lb (76.7 kg)      Other studies Reviewed: Echocardiogram 02/09/21 1. Left ventricular ejection fraction, by estimation, is 60 to 65%. Left  ventricular ejection fraction by 3D volume is 67 %. The left ventricle has  normal function. The left ventricle has no regional wall motion  abnormalities. Left ventricular diastolic   parameters are consistent with Grade I diastolic dysfunction (impaired  relaxation).   2. Right ventricular systolic function is normal. The right ventricular  size is normal. There is normal  pulmonary artery systolic pressure.   3. The mitral valve is normal in structure. Mild mitral valve  regurgitation. No evidence of mitral stenosis.   4. The aortic valve is normal in structure. Aortic valve regurgitation is  not visualized. No aortic stenosis is present.   5. The inferior vena cava is normal in size with greater than 50%  respiratory variability, suggesting right atrial pressure of 3 mmHg.   Left Heart Cath 04/18/2015 Diagnostic Dominance: Right  Left Anterior Descending  First Diagonal Branch  The vessel is small in size.  Second Diagonal Branch  The vessel is small in size.  Third Diagonal Branch  The vessel is small in size.  Left Circumflex  The vessel is small .  First Obtuse Marginal Branch  The vessel is small in size.   Intervention   No interventions have been documented.  ASSESSMENT AND PLAN:  1.  ***   Current medicines are reviewed at length with the patient today.  I have spent *** dedicated to the care of this patient on the date of this encounter to include pre-visit review of records, assessment, management and diagnostic testing,with shared decision making.  Labs/ tests ordered today include: *** Phill Myron. [place] Pugh, ANP, AACC   05/26/2021 11:59 AM    [place] [address] Office [phone] Fax [phone]  Notice: This dictation was prepared with Dragon dictation along with smaller phrase technology. Any transcriptional errors that result from this process are unintentional and may not be corrected upon review.

## 2021-05-27 ENCOUNTER — Other Ambulatory Visit: Payer: Self-pay

## 2021-05-27 ENCOUNTER — Ambulatory Visit: Payer: PPO | Admitting: Certified Registered"

## 2021-05-27 ENCOUNTER — Other Ambulatory Visit (HOSPITAL_COMMUNITY): Payer: Self-pay

## 2021-05-27 ENCOUNTER — Encounter
Admission: RE | Disposition: A | Discharge status: Home / Self Care | Payer: Self-pay | Source: Home / Self Care | Attending: Neurosurgery

## 2021-05-27 ENCOUNTER — Encounter: Payer: Self-pay | Admitting: Neurosurgery

## 2021-05-27 ENCOUNTER — Inpatient Hospital Stay
Admission: RE | Admit: 2021-05-27 | Discharge: 2021-05-31 | DRG: 030 | Disposition: A | Discharge status: Home / Self Care | Payer: PPO | Attending: Neurosurgery | Admitting: Neurosurgery

## 2021-05-27 ENCOUNTER — Ambulatory Visit: Payer: PPO

## 2021-05-27 DIAGNOSIS — G894 Chronic pain syndrome: Secondary | ICD-10-CM | POA: Diagnosis present

## 2021-05-27 DIAGNOSIS — Z743 Need for continuous supervision: Secondary | ICD-10-CM | POA: Diagnosis not present

## 2021-05-27 DIAGNOSIS — I959 Hypotension, unspecified: Secondary | ICD-10-CM | POA: Diagnosis not present

## 2021-05-27 DIAGNOSIS — Z883 Allergy status to other anti-infective agents status: Secondary | ICD-10-CM

## 2021-05-27 DIAGNOSIS — R609 Edema, unspecified: Secondary | ICD-10-CM

## 2021-05-27 DIAGNOSIS — Z881 Allergy status to other antibiotic agents status: Secondary | ICD-10-CM

## 2021-05-27 DIAGNOSIS — Z9682 Presence of neurostimulator: Secondary | ICD-10-CM | POA: Diagnosis not present

## 2021-05-27 DIAGNOSIS — F419 Anxiety disorder, unspecified: Secondary | ICD-10-CM | POA: Diagnosis present

## 2021-05-27 DIAGNOSIS — M21371 Foot drop, right foot: Secondary | ICD-10-CM | POA: Diagnosis present

## 2021-05-27 DIAGNOSIS — M48061 Spinal stenosis, lumbar region without neurogenic claudication: Secondary | ICD-10-CM | POA: Diagnosis present

## 2021-05-27 DIAGNOSIS — I10 Essential (primary) hypertension: Secondary | ICD-10-CM | POA: Diagnosis present

## 2021-05-27 DIAGNOSIS — Z4542 Encounter for adjustment and management of neuropacemaker (brain) (peripheral nerve) (spinal cord): Secondary | ICD-10-CM | POA: Diagnosis not present

## 2021-05-27 DIAGNOSIS — Z90710 Acquired absence of both cervix and uterus: Secondary | ICD-10-CM

## 2021-05-27 DIAGNOSIS — M7989 Other specified soft tissue disorders: Secondary | ICD-10-CM | POA: Diagnosis not present

## 2021-05-27 DIAGNOSIS — Z880 Allergy status to penicillin: Secondary | ICD-10-CM

## 2021-05-27 DIAGNOSIS — M545 Low back pain, unspecified: Secondary | ICD-10-CM | POA: Diagnosis not present

## 2021-05-27 DIAGNOSIS — E785 Hyperlipidemia, unspecified: Secondary | ICD-10-CM | POA: Diagnosis present

## 2021-05-27 DIAGNOSIS — Y831 Surgical operation with implant of artificial internal device as the cause of abnormal reaction of the patient, or of later complication, without mention of misadventure at the time of the procedure: Secondary | ICD-10-CM | POA: Diagnosis present

## 2021-05-27 DIAGNOSIS — R52 Pain, unspecified: Secondary | ICD-10-CM | POA: Diagnosis not present

## 2021-05-27 DIAGNOSIS — M47814 Spondylosis without myelopathy or radiculopathy, thoracic region: Secondary | ICD-10-CM | POA: Diagnosis not present

## 2021-05-27 DIAGNOSIS — Z888 Allergy status to other drugs, medicaments and biological substances status: Secondary | ICD-10-CM | POA: Diagnosis not present

## 2021-05-27 DIAGNOSIS — M549 Dorsalgia, unspecified: Secondary | ICD-10-CM | POA: Diagnosis not present

## 2021-05-27 DIAGNOSIS — Z79899 Other long term (current) drug therapy: Secondary | ICD-10-CM

## 2021-05-27 DIAGNOSIS — I700 Atherosclerosis of aorta: Secondary | ICD-10-CM | POA: Diagnosis present

## 2021-05-27 DIAGNOSIS — T85890A Other specified complication of nervous system prosthetic devices, implants and grafts, initial encounter: Principal | ICD-10-CM | POA: Diagnosis present

## 2021-05-27 DIAGNOSIS — Z793 Long term (current) use of hormonal contraceptives: Secondary | ICD-10-CM

## 2021-05-27 DIAGNOSIS — Z884 Allergy status to anesthetic agent status: Secondary | ICD-10-CM | POA: Diagnosis not present

## 2021-05-27 DIAGNOSIS — Z8249 Family history of ischemic heart disease and other diseases of the circulatory system: Secondary | ICD-10-CM | POA: Diagnosis not present

## 2021-05-27 DIAGNOSIS — Z91040 Latex allergy status: Secondary | ICD-10-CM

## 2021-05-27 DIAGNOSIS — M546 Pain in thoracic spine: Secondary | ICD-10-CM | POA: Diagnosis not present

## 2021-05-27 DIAGNOSIS — Z419 Encounter for procedure for purposes other than remedying health state, unspecified: Secondary | ICD-10-CM

## 2021-05-27 DIAGNOSIS — Z91018 Allergy to other foods: Secondary | ICD-10-CM

## 2021-05-27 DIAGNOSIS — Z975 Presence of (intrauterine) contraceptive device: Secondary | ICD-10-CM | POA: Diagnosis not present

## 2021-05-27 DIAGNOSIS — K219 Gastro-esophageal reflux disease without esophagitis: Secondary | ICD-10-CM | POA: Diagnosis present

## 2021-05-27 DIAGNOSIS — R0902 Hypoxemia: Secondary | ICD-10-CM | POA: Diagnosis not present

## 2021-05-27 HISTORY — PX: SPINAL CORD STIMULATOR REMOVAL: SHX5379

## 2021-05-27 LAB — URINE DRUG SCREEN, QUALITATIVE (ARMC ONLY)
Amphetamines, Ur Screen: NOT DETECTED
Barbiturates, Ur Screen: NOT DETECTED
Benzodiazepine, Ur Scrn: POSITIVE — AB
Cannabinoid 50 Ng, Ur ~~LOC~~: NOT DETECTED
Cocaine Metabolite,Ur ~~LOC~~: NOT DETECTED
MDMA (Ecstasy)Ur Screen: NOT DETECTED
Methadone Scn, Ur: NOT DETECTED
Opiate, Ur Screen: NOT DETECTED
Phencyclidine (PCP) Ur S: NOT DETECTED
Tricyclic, Ur Screen: NOT DETECTED

## 2021-05-27 SURGERY — LUMBAR SPINAL CORD STIMULATOR REMOVAL
Anesthesia: General

## 2021-05-27 MED ORDER — FAMOTIDINE 20 MG PO TABS: 20.0000 mg | ORAL_TABLET | Freq: Once | ORAL | Status: AC

## 2021-05-27 MED ORDER — MIDAZOLAM HCL 2 MG/2ML IJ SOLN
INTRAMUSCULAR | Status: DC | PRN
  Administered 2021-05-27 (×2): 1 mg via INTRAVENOUS

## 2021-05-27 MED ORDER — FENTANYL CITRATE (PF) 100 MCG/2ML IJ SOLN
INTRAMUSCULAR | Status: AC
  Administered 2021-05-27: 25 ug via INTRAVENOUS
  Filled 2021-05-27: qty 2

## 2021-05-27 MED ORDER — LACTATED RINGERS IV SOLN: INTRAVENOUS | Status: DC

## 2021-05-27 MED ORDER — SODIUM CHLORIDE 0.9% FLUSH
3.0000 mL | Freq: Two times a day (BID) | INTRAVENOUS | Status: DC
  Administered 2021-05-27 – 2021-05-31 (×6): 3 mL via INTRAVENOUS

## 2021-05-27 MED ORDER — PROPOFOL 10 MG/ML IV BOLUS
INTRAVENOUS | Status: DC | PRN
  Administered 2021-05-27: 100 mg via INTRAVENOUS

## 2021-05-27 MED ORDER — IRBESARTAN 150 MG PO TABS
150.0000 mg | ORAL_TABLET | Freq: Every day | ORAL | Status: DC
  Administered 2021-05-29 – 2021-05-31 (×3): 150 mg via ORAL
  Filled 2021-05-27 (×4): qty 1

## 2021-05-27 MED ORDER — VANCOMYCIN HCL IN DEXTROSE 1-5 GM/200ML-% IV SOLN
1000.0000 mg | Freq: Once | INTRAVENOUS | Status: AC
  Administered 2021-05-27: 1000 mg via INTRAVENOUS

## 2021-05-27 MED ORDER — BISACODYL 10 MG RE SUPP
10.0000 mg | Freq: Every day | RECTAL | Status: DC | PRN
  Administered 2021-05-31: 10 mg via RECTAL
  Filled 2021-05-27: qty 1

## 2021-05-27 MED ORDER — DEXAMETHASONE SODIUM PHOSPHATE 10 MG/ML IJ SOLN
INTRAMUSCULAR | Status: DC | PRN
  Administered 2021-05-27: 10 mg via INTRAVENOUS

## 2021-05-27 MED ORDER — LIDOCAINE HCL (CARDIAC) PF 100 MG/5ML IV SOSY
PREFILLED_SYRINGE | INTRAVENOUS | Status: DC | PRN
  Administered 2021-05-27: 20 mg via INTRAVENOUS

## 2021-05-27 MED ORDER — ALPRAZOLAM 0.25 MG PO TABS: 0.2500 mg | ORAL_TABLET | Freq: Every evening | ORAL | Status: DC | PRN

## 2021-05-27 MED ORDER — ORAL CARE MOUTH RINSE: 15.0000 mL | Freq: Once | OROMUCOSAL | Status: AC

## 2021-05-27 MED ORDER — DIPHENHYDRAMINE HCL 25 MG PO TABS
25.0000 mg | ORAL_TABLET | Freq: Every day | ORAL | Status: DC | PRN
  Administered 2021-05-28 – 2021-05-29 (×2): 25 mg via ORAL
  Filled 2021-05-27 (×5): qty 1

## 2021-05-27 MED ORDER — HYDROCHLOROTHIAZIDE 12.5 MG PO CAPS
12.5000 mg | ORAL_CAPSULE | Freq: Every day | ORAL | Status: DC
  Administered 2021-05-29 – 2021-05-31 (×3): 12.5 mg via ORAL
  Filled 2021-05-27 (×4): qty 1

## 2021-05-27 MED ORDER — CARVEDILOL 25 MG PO TABS
25.0000 mg | ORAL_TABLET | Freq: Two times a day (BID) | ORAL | Status: DC
  Administered 2021-05-27 – 2021-05-31 (×7): 25 mg via ORAL
  Filled 2021-05-27 (×8): qty 1

## 2021-05-27 MED ORDER — FENTANYL CITRATE (PF) 100 MCG/2ML IJ SOLN
25.0000 ug | INTRAMUSCULAR | Status: AC | PRN
  Administered 2021-05-27 (×6): 25 ug via INTRAVENOUS

## 2021-05-27 MED ORDER — DULOXETINE HCL 60 MG PO CPEP
60.0000 mg | ORAL_CAPSULE | Freq: Every day | ORAL | Status: DC
  Administered 2021-05-29 – 2021-05-31 (×3): 60 mg via ORAL
  Filled 2021-05-27 (×4): qty 1

## 2021-05-27 MED ORDER — CHLORHEXIDINE GLUCONATE 0.12 % MT SOLN
OROMUCOSAL | Status: AC
  Administered 2021-05-27: 15 mL via OROMUCOSAL
  Filled 2021-05-27: qty 15

## 2021-05-27 MED ORDER — FAMOTIDINE 20 MG PO TABS
ORAL_TABLET | ORAL | Status: AC
  Administered 2021-05-27: 20 mg via ORAL
  Filled 2021-05-27: qty 1

## 2021-05-27 MED ORDER — TRAMADOL HCL 50 MG PO TABS
50.0000 mg | ORAL_TABLET | Freq: Four times a day (QID) | ORAL | 0 refills | Status: AC | PRN
  Filled 2021-05-27: qty 25, 6d supply, fill #0

## 2021-05-27 MED ORDER — FENTANYL CITRATE (PF) 100 MCG/2ML IJ SOLN
INTRAMUSCULAR | Status: AC
  Filled 2021-05-27: qty 2

## 2021-05-27 MED ORDER — PHENOL 1.4 % MT LIQD
1.0000 | OROMUCOSAL | Status: DC | PRN
  Filled 2021-05-27: qty 177

## 2021-05-27 MED ORDER — ONDANSETRON HCL 4 MG/2ML IJ SOLN
INTRAMUSCULAR | Status: AC
  Administered 2021-05-27: 4 mg via INTRAVENOUS
  Filled 2021-05-27: qty 2

## 2021-05-27 MED ORDER — FLEET ENEMA 7-19 GM/118ML RE ENEM: 1.0000 | ENEMA | Freq: Once | RECTAL | Status: DC | PRN

## 2021-05-27 MED ORDER — CHLORHEXIDINE GLUCONATE 0.12 % MT SOLN: 15.0000 mL | Freq: Once | OROMUCOSAL | Status: AC

## 2021-05-27 MED ORDER — OXYCODONE HCL 5 MG PO TABS
10.0000 mg | ORAL_TABLET | ORAL | Status: DC | PRN
Start: 2021-05-27 — End: 2021-05-31
  Administered 2021-05-27 – 2021-05-31 (×18): 10 mg via ORAL
  Filled 2021-05-27 (×17): qty 2

## 2021-05-27 MED ORDER — APREPITANT 40 MG PO CAPS: 40.0000 mg | ORAL_CAPSULE | Freq: Once | ORAL | Status: AC

## 2021-05-27 MED ORDER — ACETAMINOPHEN 10 MG/ML IV SOLN
INTRAVENOUS | Status: DC | PRN
  Administered 2021-05-27: 1000 mg via INTRAVENOUS

## 2021-05-27 MED ORDER — BUPIVACAINE-EPINEPHRINE (PF) 0.5% -1:200000 IJ SOLN
INTRAMUSCULAR | Status: DC | PRN
  Administered 2021-05-27: 22 mL

## 2021-05-27 MED ORDER — SODIUM CHLORIDE 0.9 % IV SOLN: INTRAVENOUS | Status: DC

## 2021-05-27 MED ORDER — TELMISARTAN-HCTZ 40-12.5 MG PO TABS: 1.0000 | ORAL_TABLET | Freq: Every day | ORAL | Status: DC

## 2021-05-27 MED ORDER — MENTHOL 3 MG MT LOZG
1.0000 | LOZENGE | OROMUCOSAL | Status: DC | PRN
  Filled 2021-05-27: qty 9

## 2021-05-27 MED ORDER — MECLIZINE HCL 25 MG PO TABS
25.0000 mg | ORAL_TABLET | Freq: Two times a day (BID) | ORAL | Status: DC | PRN
  Filled 2021-05-27: qty 1

## 2021-05-27 MED ORDER — SUCCINYLCHOLINE CHLORIDE 200 MG/10ML IV SOSY
PREFILLED_SYRINGE | INTRAVENOUS | Status: DC | PRN
  Administered 2021-05-27: 100 mg via INTRAVENOUS

## 2021-05-27 MED ORDER — POLYETHYLENE GLYCOL 3350 17 G PO PACK
17.0000 g | PACK | Freq: Every day | ORAL | Status: DC | PRN
  Administered 2021-05-27 – 2021-05-30 (×4): 17 g via ORAL
  Filled 2021-05-27 (×5): qty 1

## 2021-05-27 MED ORDER — APREPITANT 40 MG PO CAPS
ORAL_CAPSULE | ORAL | Status: AC
  Administered 2021-05-27: 40 mg via ORAL
  Filled 2021-05-27: qty 1

## 2021-05-27 MED ORDER — FENTANYL CITRATE (PF) 100 MCG/2ML IJ SOLN
INTRAMUSCULAR | Status: DC | PRN
  Administered 2021-05-27 (×2): 50 ug via INTRAVENOUS

## 2021-05-27 MED ORDER — OXYCODONE HCL 5 MG PO TABS
5.0000 mg | ORAL_TABLET | ORAL | Status: DC | PRN
Start: 2021-05-27 — End: 2021-05-31
  Filled 2021-05-27 (×2): qty 1

## 2021-05-27 MED ORDER — SODIUM CHLORIDE 0.9% FLUSH: 3.0000 mL | INTRAVENOUS | Status: DC | PRN

## 2021-05-27 MED ORDER — ONDANSETRON HCL 4 MG/2ML IJ SOLN: 4.0000 mg | Freq: Once | INTRAMUSCULAR | Status: AC | PRN

## 2021-05-27 MED ORDER — MIDAZOLAM HCL 2 MG/2ML IJ SOLN
INTRAMUSCULAR | Status: AC
  Filled 2021-05-27: qty 2

## 2021-05-27 MED ORDER — ATORVASTATIN CALCIUM 20 MG PO TABS
20.0000 mg | ORAL_TABLET | Freq: Every day | ORAL | Status: DC
  Administered 2021-05-27 – 2021-05-30 (×4): 20 mg via ORAL
  Filled 2021-05-27 (×4): qty 1

## 2021-05-27 MED ORDER — SODIUM CHLORIDE 0.9 % IV SOLN: 250.0000 mL | INTRAVENOUS | Status: DC

## 2021-05-27 MED ORDER — ONDANSETRON HCL 4 MG PO TABS: 4.0000 mg | ORAL_TABLET | Freq: Four times a day (QID) | ORAL | Status: DC | PRN

## 2021-05-27 MED ORDER — VITAMIN D 25 MCG (1000 UNIT) PO TABS
1000.0000 [IU] | ORAL_TABLET | Freq: Every day | ORAL | Status: DC
  Administered 2021-05-29 – 2021-05-31 (×3): 1000 [IU] via ORAL
  Filled 2021-05-27 (×4): qty 1

## 2021-05-27 MED ORDER — 0.9 % SODIUM CHLORIDE (POUR BTL) OPTIME
TOPICAL | Status: DC | PRN
  Administered 2021-05-27: 1000 mL

## 2021-05-27 MED ORDER — BACLOFEN 10 MG PO TABS
5.0000 mg | ORAL_TABLET | Freq: Three times a day (TID) | ORAL | Status: DC | PRN
  Administered 2021-05-29 – 2021-05-30 (×2): 5 mg via ORAL
  Filled 2021-05-27 (×4): qty 0.5

## 2021-05-27 MED ORDER — VANCOMYCIN HCL IN DEXTROSE 1-5 GM/200ML-% IV SOLN
INTRAVENOUS | Status: AC
  Filled 2021-05-27: qty 200

## 2021-05-27 MED ORDER — IPRATROPIUM-ALBUTEROL 0.5-2.5 (3) MG/3ML IN SOLN
3.0000 mL | Freq: Once | RESPIRATORY_TRACT | Status: AC
  Administered 2021-05-27: 3 mL via RESPIRATORY_TRACT

## 2021-05-27 MED ORDER — GABAPENTIN 400 MG PO CAPS
400.0000 mg | ORAL_CAPSULE | Freq: Three times a day (TID) | ORAL | Status: DC
  Administered 2021-05-27 – 2021-05-31 (×12): 400 mg via ORAL
  Filled 2021-05-27 (×12): qty 1

## 2021-05-27 MED ORDER — SODIUM CHLORIDE 0.9 % IV SOLN
INTRAVENOUS | Status: DC | PRN
  Administered 2021-05-27: 25 ug/min via INTRAVENOUS

## 2021-05-27 MED ORDER — ONDANSETRON HCL 4 MG/2ML IJ SOLN: 4.0000 mg | Freq: Four times a day (QID) | INTRAMUSCULAR | Status: DC | PRN

## 2021-05-27 MED ORDER — ACETAMINOPHEN 10 MG/ML IV SOLN
INTRAVENOUS | Status: AC
  Filled 2021-05-27: qty 100

## 2021-05-27 MED ORDER — ACETAMINOPHEN 500 MG PO TABS
1000.0000 mg | ORAL_TABLET | Freq: Four times a day (QID) | ORAL | Status: AC
  Administered 2021-05-27 – 2021-05-28 (×2): 1000 mg via ORAL
  Filled 2021-05-27 (×3): qty 2

## 2021-05-27 MED ORDER — GLYCOPYRROLATE 0.2 MG/ML IJ SOLN
INTRAMUSCULAR | Status: DC | PRN
  Administered 2021-05-27: .2 mg via INTRAVENOUS

## 2021-05-27 MED ORDER — REMIFENTANIL HCL 1 MG IV SOLR
INTRAVENOUS | Status: AC
  Filled 2021-05-27: qty 1000

## 2021-05-27 MED ORDER — LACTATED RINGERS IV SOLN: INTRAVENOUS | Status: DC | PRN

## 2021-05-27 MED ORDER — PHENYLEPHRINE HCL (PRESSORS) 10 MG/ML IV SOLN
INTRAVENOUS | Status: AC
  Filled 2021-05-27: qty 1

## 2021-05-27 MED ORDER — VANCOMYCIN HCL 1000 MG IV SOLR
INTRAVENOUS | Status: DC | PRN
  Administered 2021-05-27: 1000 mg via TOPICAL

## 2021-05-27 MED ORDER — SENNA 8.6 MG PO TABS
1.0000 | ORAL_TABLET | Freq: Two times a day (BID) | ORAL | Status: DC
  Administered 2021-05-27 – 2021-05-30 (×6): 8.6 mg via ORAL
  Filled 2021-05-27 (×7): qty 1

## 2021-05-27 MED ORDER — REMIFENTANIL HCL 1 MG IV SOLR
INTRAVENOUS | Status: DC | PRN
  Administered 2021-05-27: .15 ug/kg/min via INTRAVENOUS

## 2021-05-27 MED ORDER — ONDANSETRON HCL 4 MG/2ML IJ SOLN
INTRAMUSCULAR | Status: DC | PRN
  Administered 2021-05-27 (×2): 4 mg via INTRAVENOUS

## 2021-05-27 MED ORDER — IPRATROPIUM-ALBUTEROL 0.5-2.5 (3) MG/3ML IN SOLN
RESPIRATORY_TRACT | Status: AC
  Filled 2021-05-27: qty 3

## 2021-05-27 SURGICAL SUPPLY — 77 items
ADH SKN CLS APL DERMABOND .7 (GAUZE/BANDAGES/DRESSINGS) ×1
AGENT HMST MTR 8 SURGIFLO (HEMOSTASIS)
APL PRP STRL LF DISP 70% ISPRP (MISCELLANEOUS) ×2
APL SRG 60D 8 XTD TIP BNDBL (TIP)
BLADE BOVIE TIP EXT 4 (BLADE) ×2 IMPLANT
BUR NEURO DRILL SOFT 3.0X3.8M (BURR) ×2 IMPLANT
CANISTER SUCT 1200ML W/VALVE (MISCELLANEOUS) ×2 IMPLANT
CHLORAPREP W/TINT 26 (MISCELLANEOUS) ×3 IMPLANT
CNTNR SPEC 2.5X3XGRAD LEK (MISCELLANEOUS) ×1
CONT SPEC 4OZ STER OR WHT (MISCELLANEOUS) ×1
CONT SPEC 4OZ STRL OR WHT (MISCELLANEOUS) ×1
CONTAINER SPEC 2.5X3XGRAD LEK (MISCELLANEOUS) ×1 IMPLANT
COUNTER NEEDLE 20/40 LG (NEEDLE) ×2 IMPLANT
COVER LIGHT HANDLE STERIS (MISCELLANEOUS) ×4 IMPLANT
CUP MEDICINE 2OZ PLAST GRAD ST (MISCELLANEOUS) ×2 IMPLANT
DERMABOND ADVANCED (GAUZE/BANDAGES/DRESSINGS) ×1
DERMABOND ADVANCED .7 DNX12 (GAUZE/BANDAGES/DRESSINGS) ×1 IMPLANT
DEVICE DISSECT PLASMABLAD 3.0S (MISCELLANEOUS) IMPLANT
DRAPE C-ARM XRAY 36X54 (DRAPES) ×4 IMPLANT
DRAPE INCISE 23X17 IOBAN STRL (DRAPES) ×1
DRAPE INCISE 23X17 STRL (DRAPES) IMPLANT
DRAPE INCISE IOBAN 23X17 STRL (DRAPES) ×1 IMPLANT
DRAPE LAPAROTOMY 100X77 ABD (DRAPES) ×3 IMPLANT
DRAPE MICROSCOPE SPINE 48X150 (DRAPES) IMPLANT
DRAPE SURG 17X11 SM STRL (DRAPES) ×3 IMPLANT
DRSG TEGADERM 4X4.75 (GAUZE/BANDAGES/DRESSINGS) ×1 IMPLANT
DRSG TEGADERM 6X8 (GAUZE/BANDAGES/DRESSINGS) ×1 IMPLANT
DRSG TELFA 4X3 1S NADH ST (GAUZE/BANDAGES/DRESSINGS) ×2 IMPLANT
DURASEAL APPLICATOR TIP (TIP) IMPLANT
DURASEAL SPINE SEALANT 3ML (MISCELLANEOUS) IMPLANT
ELECT CAUTERY BLADE TIP 2.5 (TIP) ×2
ELECT EZSTD 165MM 6.5IN (MISCELLANEOUS) ×2
ELECT REM PT RETURN 9FT ADLT (ELECTROSURGICAL) ×2
ELECTRODE CAUTERY BLDE TIP 2.5 (TIP) ×1 IMPLANT
ELECTRODE EZSTD 165MM 6.5IN (MISCELLANEOUS) ×1 IMPLANT
ELECTRODE REM PT RTRN 9FT ADLT (ELECTROSURGICAL) ×1 IMPLANT
GAUZE 4X4 16PLY ~~LOC~~+RFID DBL (SPONGE) ×5 IMPLANT
GAUZE SPONGE 4X4 12PLY STRL (GAUZE/BANDAGES/DRESSINGS) ×2 IMPLANT
GLOVE SRG 8 PF TXTR STRL LF DI (GLOVE) ×1 IMPLANT
GLOVE SURG SYN 7.0 (GLOVE) ×4 IMPLANT
GLOVE SURG SYN 7.0 PF PI (GLOVE) ×2 IMPLANT
GLOVE SURG SYN 8.0 (GLOVE) ×4 IMPLANT
GLOVE SURG SYN 8.0 PF PI (GLOVE) ×1 IMPLANT
GLOVE SURG UNDER POLY LF SZ7 (GLOVE) ×3 IMPLANT
GLOVE SURG UNDER POLY LF SZ8 (GLOVE) ×2
GOWN SRG LRG LVL 4 IMPRV REINF (GOWNS) IMPLANT
GOWN STRL REIN LRG LVL4 (GOWNS) ×4
GOWN STRL REUS W/ TWL LRG LVL3 (GOWN DISPOSABLE) ×1 IMPLANT
GOWN STRL REUS W/ TWL XL LVL3 (GOWN DISPOSABLE) ×2 IMPLANT
GOWN STRL REUS W/TWL LRG LVL3 (GOWN DISPOSABLE) ×4
GOWN STRL REUS W/TWL XL LVL3 (GOWN DISPOSABLE) ×4
GRADUATE 1200CC STRL 31836 (MISCELLANEOUS) ×2 IMPLANT
KIT TURNOVER KIT A (KITS) ×2 IMPLANT
KIT WILSON FRAME (KITS) ×2 IMPLANT
MANIFOLD NEPTUNE II (INSTRUMENTS) ×2 IMPLANT
MARKER SKIN DUAL TIP RULER LAB (MISCELLANEOUS) ×3 IMPLANT
NDL SAFETY ECLIPSE 18X1.5 (NEEDLE) ×1 IMPLANT
NEEDLE HYPO 18GX1.5 SHARP (NEEDLE) ×2
NEEDLE HYPO 22GX1.5 SAFETY (NEEDLE) ×2 IMPLANT
NS IRRIG 1000ML POUR BTL (IV SOLUTION) ×2 IMPLANT
PACK LAMINECTOMY NEURO (CUSTOM PROCEDURE TRAY) ×2 IMPLANT
PAD ARMBOARD 7.5X6 YLW CONV (MISCELLANEOUS) ×3 IMPLANT
PLASMABLADE 3.0S (MISCELLANEOUS) ×2
SPOGE SURGIFLO 8M (HEMOSTASIS)
SPONGE SURGIFLO 8M (HEMOSTASIS) IMPLANT
STAPLER SKIN PROX 35W (STAPLE) IMPLANT
SUT ETHILON 3-0 FS-10 30 BLK (SUTURE) ×6
SUT POLYSORB 2-0 5X18 GS-10 (SUTURE) ×6 IMPLANT
SUT VIC AB 0 CT1 18XCR BRD 8 (SUTURE) ×2 IMPLANT
SUT VIC AB 0 CT1 8-18 (SUTURE) ×4
SUTURE EHLN 3-0 FS-10 30 BLK (SUTURE) ×2 IMPLANT
SYR 10ML LL (SYRINGE) ×4 IMPLANT
SYR 20ML LL LF (SYRINGE) ×2 IMPLANT
SYR 30ML LL (SYRINGE) ×4 IMPLANT
SYR 3ML LL SCALE MARK (SYRINGE) ×2 IMPLANT
TOWEL OR 17X26 4PK STRL BLUE (TOWEL DISPOSABLE) ×6 IMPLANT
TUBING CONNECTING 10 (TUBING) ×3 IMPLANT

## 2021-05-27 NOTE — Interval H&P Note (Signed)
History and Physical Interval Note:  05/27/2021 9:11 AM  [name]  has presented today for surgery, with the diagnosis of g89.4 chronic pain syndrom.  The various methods of treatment have been discussed with the patient and family. After consideration of risks, benefits and other options for treatment, the patient has consented to  Procedure(s): LUMBAR SPINAL CORD STIMULATOR REMOVAL (N/A) as a surgical intervention.  The patient's history has been reviewed, patient examined, no change in status, stable for surgery.  I have reviewed the patient's chart and labs.  Questions were answered to the patient's satisfaction.     Deetta Perla

## 2021-05-27 NOTE — Discharge Instructions (Addendum)
NEUROSURGERY DISCHARGE INSTRUCTIONS  Admission diagnosis: g89.4 chronic pain syndrom  Operative procedure: Removal of thoracic spinal cord stimulator   What to do after you leave the hospital:  Recommended diet: cardiac diet. Increase protein intake to promote wound healing.  Recommended activity: activity as tolerated. No driving for 6 weeks.You should walk multiple times per day  Special Instructions  No straining, no heavy lifting > 10lbs x 4 weeks.  Keep incision area clean and dry. May shower in 2 days. No baths or pools for 6 weeks.  Please remove dressing tomorrow, no need to apply a bandage afterwards  You have sutures or staples that will be removed in clinic. Two flank incisions were closed with skin glue. This will come off on its own in 10-14 days.   Please take pain medications as directed. Take a stool softener if on pain medications   Please Report any of the following: Nausea or Vomiting, Temperature is greater than 101.27F (38.1C) degrees, Dizziness, Abdominal Pain, Difficulty Breathing or Shortness of Breath, Inability to Eat, drink Fluids, or Take medications, Bleeding, swelling, or drainage from surgical incision sites, New numbness or weakness, and Bowel or bladder dysfunction to the neurosurgeon on call at [phone]  Additional Follow up appointments Please follow up with Cooper Render in [facility] as scheduled in 2-3 weeks for suture removal and wound check.    Please see below for scheduled appointments:  Future Appointments  Date Time Provider [place]  05/31/2021  2:15 PM Lendon Colonel, NP [facility]  06/28/2021 12:10 PM [facility]G MM 2 GI-[facility]GMM [facility]  07/10/2021 10:15 AM Hilty, Nadean Corwin, MD [facility]  07/31/2021 10:00 AM Meredith Staggers, MD CPR-PRMA CPR  10/04/2021  8:30 AM [name], [name], MD [facility]     AMBULATORY SURGERY  DISCHARGE INSTRUCTIONS   The drugs that you were given will stay in your system until  tomorrow so for the next 24 hours you should not:  Drive an automobile Make any legal decisions Drink any alcoholic beverage   You may resume regular meals tomorrow.  Today it is better to start with liquids and gradually work up to solid foods.  You may eat anything you prefer, but it is better to start with liquids, then soup and crackers, and gradually work up to solid foods.   Please notify your doctor immediately if you have any unusual bleeding, trouble breathing, redness and pain at the surgery site, drainage, fever, or pain not relieved by medication.     Your post-operative visit with Dr.                                       is: Date:                        Time:    Please call to schedule your post-operative visit.  Additional Instructions:

## 2021-05-27 NOTE — Progress Notes (Signed)
Pt states she "doesn't feel right" Pt requesting to stay. Dr. Lacinda Axon notified. Orders received.

## 2021-05-27 NOTE — Op Note (Signed)
Operative Note   SURGERY DATE:  05/27/2021   PRE-OP DIAGNOSIS:  Chronic pain syndrome, failed spinal cord stimulator   POST-OP DIAGNOSIS: Post-Op Diagnosis Codes: Chronic pain syndrome   Procedure(s) with comments: Thoracic Laminectomy with removal of SCS Paddle  Removal of right flank pulse generator   SURGEON:    * Malen Gauze, MD      Cooper Render, PA Assistant   ANESTHESIA: General    OPERATIVE FINDINGS: Successful removal of thoracic spinal cord stimulator   Indication [name] was seen in clinic on 6/7 after having a spinal cord stimulator implant previously for chronic pain.  Since that time, she has not been able to get adequate control with this and she is in need of MRI for cardiac reasons.  The patient wished to proceed to explant of the stimulator to facilitate MRI. Risks including damage to spinal cord, weakness, hematoma, infection, failure of pain relief, post-operative pain, stroke, heart attack, pneumonia, and spinal cord injury were discussed.     Procedure The patient was brought to the operating room where vascular access was obtained and intubated by the anesthesia service.  Antibiotics were given.  The patient was turned prone onto a Wilson frame.  Prior incisions were marked. The patient was prepped and draped in a sterile fashion. A hard time out was performed. Local anesthetic was instilled into incisions.   The thoracic incision was opened sharply over the midline and continued through the fascia to the level of the wires and these were exposed going into the lamina above. The lamina above was exposed  Next, rongeurs were used to remove the scar tissue exposing the paddle.  Once the soft tissue was dissected with cautery, the leads and paddle were gently retracted in the caudal direction and the paddle was removed intact.  The leads were cut.  Hemostasis was achieved.   The right flank incision was opened sharply down to level of the battery and the  full with exposed.  The battery was removed and the wires cut here also. Next, the left flank incision was opened to expose the extension leads and connectors there. The wires were able to be removed there and then fluoroscopy confirmed no remaining hardware.  Hemostasis was achieved. Each incision was irrigated with saline. Then, each incision was closed with combination of 0, 2-0, and 3-0 vicryls. The skin was closed with 3-0 Nylon in the thoracic area and Dermabond on the flank incisions. Sterile dressings were applied. The patient was returned to supine position and extubated. The patient was seen to be moving all extremities symmetrically and was taken to PACU for recovery. The family was updated and all questions answered.   ESTIMATED BLOOD LOSS:   20 cc   SPECIMENS None   IMPLANT None     I performed the case in its entirety with assistance of PA, Ernestene Kiel, Kie[place]

## 2021-05-27 NOTE — Anesthesia Procedure Notes (Signed)
Procedure Name: Intubation Date/Time: 05/27/2021 9:53 AM Performed by: Kelton Pillar, CRNA Pre-anesthesia Checklist: Patient identified, Emergency Drugs available, Suction available and Patient being monitored Patient Re-evaluated:Patient Re-evaluated prior to induction Oxygen Delivery Method: Circle system utilized Preoxygenation: Pre-oxygenation with 100% oxygen Induction Type: IV induction Ventilation: Mask ventilation without difficulty Laryngoscope Size: McGraph and 3 Grade View: Grade I Tube type: Oral Tube size: 6.5 mm Number of attempts: 1 Airway Equipment and Method: Stylet and Oral airway Placement Confirmation: ETT inserted through vocal cords under direct vision, positive ETCO2, breath sounds checked- equal and bilateral and CO2 detector Secured at: 21 cm Tube secured with: Tape Dental Injury: Teeth and Oropharynx as per pre-operative assessment  Difficulty Due To: Difficult Airway- due to large tongue Future Recommendations: Recommend- induction with short-acting agent, and alternative techniques readily available

## 2021-05-27 NOTE — Transfer of Care (Signed)
Immediate Anesthesia Transfer of Care Note  Patient: [name]  Procedure(s) Performed: LUMBAR SPINAL CORD STIMULATOR REMOVAL  Patient Location: PACU  Anesthesia Type:General  Level of Consciousness: awake, drowsy and patient cooperative  Airway & Oxygen Therapy: Patient Spontanous Breathing and Patient connected to face mask oxygen  Post-op Assessment: Report given to RN and Post -op Vital signs reviewed and stable  Post vital signs: Reviewed and stable  Last Vitals:  Vitals Value Taken Time  BP 156/87 05/27/21 1200  Temp    Pulse 82 05/27/21 1201  Resp 14 05/27/21 1201  SpO2 100 % 05/27/21 1201  Vitals shown include unvalidated device data.  Last Pain:  Vitals:   05/27/21 0853  TempSrc: Oral  PainSc: 0-No pain         Complications: No notable events documented.

## 2021-05-27 NOTE — Anesthesia Preprocedure Evaluation (Addendum)
Anesthesia Evaluation  Patient identified by MRN, date of birth, ID band Patient awake    Reviewed: Allergy & Precautions, H&P , NPO status , Patient's Chart, lab work & pertinent test results, reviewed documented beta blocker date and time   History of Anesthesia Complications (+) PONV and history of anesthetic complications  Airway Mallampati: II  TM Distance: >3 FB Neck ROM: full    Dental  (+) Teeth Intact   Pulmonary neg pulmonary ROS, shortness of breath,    Pulmonary exam normal        Cardiovascular Exercise Tolerance: Poor hypertension, On Medications + angina +CHF  negative cardio ROS Normal cardiovascular exam Rhythm:regular Rate:Normal  preop clearance per cardiology and removal per their request in order to proceed with mri for ihss. ja   Neuro/Psych  Headaches, PSYCHIATRIC DISORDERS Anxiety Depression  Neuromuscular disease negative neurological ROS  negative psych ROS   GI/Hepatic negative GI ROS, Neg liver ROS, GERD  Medicated,  Endo/Other  negative endocrine ROS  Renal/GU Renal diseasenegative Renal ROS  negative genitourinary   Musculoskeletal   Abdominal   Peds  Hematology negative hematology ROS (+) Blood dyscrasia, anemia ,   Anesthesia Other Findings Past Medical History: No date: Anxiety 04/16/2021: Aortic atherosclerosis (HCC) No date: Atypical angina (HCC) No date: Back pain     Comment:  related to spinal stenosis and disc problem, radiates               down left buttocks to leg., weakness occ. No date: Chest pain     Comment:  a. 03/2015 Cath: nl cors; b. 03/2021 Cor CTA: Ca2+ = 0. Nl              Cors. No date: Chronic pain syndrome No date: Dyspnea No date: GERD (gastroesophageal reflux disease) No date: Grade I diastolic dysfunction No date: Headache No date: Hyperlipidemia No date: Hypertension No date: Lumbar post-laminectomy syndrome 12/15/2020: LVH (left ventricular  hypertrophy)     Comment:  a. 11/2020 Echo: EF 65-70%, no rwma, sev asymm LVH with               IVSd 1.9 cm. No LVOT obs @ rest. Gr1 DD. Triv MR. No date: PONV (postoperative nausea and vomiting) No date: Pulmonary nodules     Comment:  a. 03/2021 CT Chest: 20m pulm nodules in bilat lower               lobes. F/u 1 yr. No date: Right foot drop No date: Syncope     Comment:  a. 11/2020 Zio: No significant arrhythmias. No date: Vaginal foreign object     Comment:  "Uses Femring" Past Surgical History: No date: ABDOMINAL HYSTERECTOMY 12/16/2020: BIOPSY     Comment:  Procedure: BIOPSY;  Surgeon: SLadene Artist MD;                [place]  Service: Endoscopy;; 04/18/2015: CARDIAC CATHETERIZATION; N/A     Comment:  Procedure: Left Heart Cath and Coronary Angiography;                Surgeon: MCharolette Forward MD;  [place]               [place];  Service: Cardiovascular;  Laterality: N/A; No date: COLONOSCOPY W/ BIOPSIES AND POLYPECTOMY 12/16/2020: ESOPHAGOGASTRODUODENOSCOPY (EGD) WITH PROPOFOL; N/A     Comment:  Procedure: ESOPHAGOGASTRODUODENOSCOPY (EGD) WITH               PROPOFOL;  Surgeon: Ladene Artist, MD;  Location: [facility]               ENDOSCOPY;  Service: Endoscopy;  Laterality: N/A; No date: FOOT SURGERY; Bilateral     Comment:  [place] "bunion,bone spur, tendon" (1) -6'16,               (1)-10'16 07/21/2018: IR EPIDUROGRAPHY 12/28/2015: LUMBAR LAMINECTOMY/DECOMPRESSION MICRODISCECTOMY; Bilateral     Comment:  Procedure: MICRO LUMBAR DECOMPRESSION L4 - L5               BILATERALLY;  Surgeon: [name], MD;  [facility]: [facility]               ORS;  Service: Orthopedics;  Laterality: Bilateral; 03/04/2018: LUMBAR LAMINECTOMY/DECOMPRESSION MICRODISCECTOMY; Bilateral     Comment:  Procedure: Revision of Microlumbar Decompression               Bilateral Lumbar Four-Five;  Surgeon: [name], MD;              Location: [place];  Service: Orthopedics;   Laterality:               Bilateral;  90 mins 12/16/2020: SAVORY DILATION; N/A     Comment:  Procedure: SAVORY DILATION;  Surgeon: Ladene Artist,               MD;  Location: [facility] ENDOSCOPY;  Service: Endoscopy;                Laterality: N/A; 09/28/2019: SPINAL CORD STIMULATOR INSERTION; N/A     Comment:  Procedure: THORACIC SPINAL CORD STIMULATOR INSERTION;                Surgeon: Melina Schools, MD;  Location: [place];  Service:               Orthopedics;  Laterality: N/A;  2.5 hrs No date: TUBAL LIGATION No date: WISDOM TOOTH EXTRACTION 03/04/2018: WOUND EXPLORATION; N/A     Comment:  Procedure: EXPLORATION OF LUMBAR DECOMPRESSION WOUND;                Surgeon: [name], MD;  Location: [place];  Service:               Orthopedics;  Laterality: N/A; BMI    Body Mass Index: 28.12 kg/m     Reproductive/Obstetrics negative OB ROS                            Anesthesia Physical Anesthesia Plan  ASA: 3  Anesthesia Plan: General ETT   Post-op Pain Management:    Induction:   PONV Risk Score and Plan: 4 or greater  Airway Management Planned:   Additional Equipment:   Intra-op Plan:   Post-operative Plan:   Informed Consent: I have reviewed the patients History and Physical, chart, labs and discussed the procedure including the risks, benefits and alternatives for the proposed anesthesia with the patient or authorized representative who has indicated his/her understanding and acceptance.     Dental Advisory Given  Plan Discussed with: CRNA  Anesthesia Plan Comments: (Pt refuses mac for above.  She insists on got.  I have gone over the risks and benefits of the two options with her. ja)       Anesthesia Quick Evaluation

## 2021-05-27 NOTE — Progress Notes (Signed)
Pt states she feels as though she isn't breathing good and requesting oxygen. Pt O2 sats on room air are 100%. Pt lungs clear to auscultation. Dr. Andree Elk notified. Acknowledged. Orders received.

## 2021-05-27 NOTE — H&P (Signed)
[name] is an 60 y.o. female.   Chief Complaint: Chronic pain syndrome HPI: [name] is here for evaluation after having a previous thoracic spinal cord stimulator placed. She was more recently diagnosed with a cardiac abnormality which she needs an MRI for and currently her spinal cord stimulator is not MRI compatible. She states that she first underwent a surgery for decompression of lumbar spine and this resulted in weakness in the right foot. Given pain afterwards, she did undergo the spinal cord stimulator trial and eventually the permanent paddle placement. She states at this time, she does not get much relief from this and is actually turned the stimulator off. She has not noticed any increased pain since turning it off. She did have some numbness after one of her surgeries but she does feel like this is improving. At this point, she would like to proceed with removal and has had cardiac clearance.  Past Medical History:  Diagnosis Date   Anxiety    Aortic atherosclerosis ([place]) 04/16/2021   Atypical angina (HCC)    Back pain    related to spinal stenosis and disc problem, radiates down left buttocks to leg., weakness occ.   Chest pain    a. 03/2015 Cath: nl cors; b. 03/2021 Cor CTA: Ca2+ = 0. Nl Cors.   Chronic pain syndrome    Dyspnea    GERD (gastroesophageal reflux disease)    Grade I diastolic dysfunction    Headache    Hyperlipidemia    Hypertension    Lumbar post-laminectomy syndrome    LVH (left ventricular hypertrophy) 12/15/2020   a. 11/2020 Echo: EF 65-70%, no rwma, sev asymm LVH with IVSd 1.9 cm. No LVOT obs @ rest. Gr1 DD. Triv MR.   PONV (postoperative nausea and vomiting)    Pulmonary nodules    a. 03/2021 CT Chest: 83mm pulm nodules in bilat lower lobes. F/u 1 yr.   Right foot drop    Syncope    a. 11/2020 Zio: No significant arrhythmias.   Vaginal foreign object    "Uses Femring"    Past Surgical History:  Procedure Laterality Date    ABDOMINAL HYSTERECTOMY     BIOPSY  12/16/2020   Procedure: BIOPSY;  Surgeon: Ladene Artist, MD;  Location: [place];  Service: Endoscopy;;   CARDIAC CATHETERIZATION N/A 04/18/2015   Procedure: Left Heart Cath and Coronary Angiography;  Surgeon: [name], MD;  Location: [place] CV LAB;  Service: Cardiovascular;  Laterality: N/A;   COLONOSCOPY W/ BIOPSIES AND POLYPECTOMY     ESOPHAGOGASTRODUODENOSCOPY (EGD) WITH PROPOFOL N/A 12/16/2020   Procedure: ESOPHAGOGASTRODUODENOSCOPY (EGD) WITH PROPOFOL;  Surgeon: Ladene Artist, MD;  Location: [facility] ENDOSCOPY;  Service: Endoscopy;  Laterality: N/A;   FOOT SURGERY Bilateral    [place] "bunion,bone spur, tendon" (1) -6'16, (1)-10'16   IR EPIDUROGRAPHY  07/21/2018   LUMBAR LAMINECTOMY/DECOMPRESSION MICRODISCECTOMY Bilateral 12/28/2015   Procedure: MICRO LUMBAR DECOMPRESSION L4 - L5 BILATERALLY;  Surgeon: [name], MD;  Location: [facility];  Service: Orthopedics;  Laterality: Bilateral;   LUMBAR LAMINECTOMY/DECOMPRESSION MICRODISCECTOMY Bilateral 03/04/2018   Procedure: Revision of Microlumbar Decompression Bilateral Lumbar Four-Five;  Surgeon: [name], MD;  Location: [place];  Service: Orthopedics;  Laterality: Bilateral;  90 mins   SAVORY DILATION N/A 12/16/2020   Procedure: SAVORY DILATION;  Surgeon: Ladene Artist, MD;  Location: [facility] ENDOSCOPY;  Service: Endoscopy;  Laterality: N/A;   SPINAL CORD STIMULATOR INSERTION N/A 09/28/2019   Procedure: THORACIC SPINAL CORD STIMULATOR INSERTION;  Surgeon: Melina Schools, MD;  Location: [place];  Service: Orthopedics;  Laterality: N/A;  2.5 hrs   TUBAL LIGATION     WISDOM TOOTH EXTRACTION     WOUND EXPLORATION N/A 03/04/2018   Procedure: EXPLORATION OF LUMBAR DECOMPRESSION WOUND;  Surgeon: [name], MD;  Location: [place];  Service: Orthopedics;  Laterality: N/A;    Family History  Problem Relation Age of Onset   Heart attack Mother    Lung cancer Father    Cancer Father     Pancreatic cancer Sister    Breast cancer Sister 42   Throat cancer Brother    Multiple myeloma Sister    Breast cancer Sister        diagnosed in her 7's   Heart attack Sister    Stomach cancer Cousin    Colon cancer Neg Hx    Social History:  reports that she has never smoked. She has never used smokeless tobacco. She reports that she does not drink alcohol and does not use drugs.  Allergies:  Allergies  Allergen Reactions   Cephalosporins Anaphylaxis   Penicillins Anaphylaxis and Hives    Did it involve swelling of the face/tongue/throat, SOB, or low BP? Yes Did it involve sudden or severe rash/hives, skin peeling, or any reaction on the inside of your mouth or nose? Yes Did you need to seek medical attention at a hospital or doctor's office? Yes When did it last happen?      10+ years If all above answers are "NO", may proceed with cephalosporin use.     Anesthetics, Amide Nausea And Vomiting    Does not know name of it. States they put it on record [facility].    Betadine [Povidone Iodine] Other (See Comments)    "Skin Burn" caused scar on Left buttock   Latex Hives, Itching and Rash   [place] [Prunus Persica] Hives   Claritin [Loratadine]     Rash, pruritis     Medications Prior to Admission  Medication Sig Dispense Refill   ALPRAZolam (XANAX) 0.5 MG tablet Take 1/2 to 1 tablet (0.25-0.5 mg total) by mouth at bedtime as needed for anxiety. 30 tablet 1   atorvastatin (LIPITOR) 20 MG tablet TAKE 1 TABLET BY MOUTH ONCE DAILY 90 tablet 3   baclofen (LIORESAL) 10 MG tablet TAKE 1 TABLET BY MOUTH THREE TIMES DAILY AS NEEDED 90 tablet 3   carvedilol (COREG) 25 MG tablet Take 1 tablet (25 mg total) by mouth 2 (two) times daily with a meal. 180 tablet 3   cholecalciferol (VITAMIN D3) 25 MCG (1000 UNIT) tablet Take 1,000 Units by mouth daily.     DULoxetine (CYMBALTA) 60 MG capsule Take 1 capsule (60 mg total) by mouth daily. 90 capsule 2   Estradiol Acetate (FEMRING) 0.05  MG/24HR RING USE ONE RING VAGINALLY EVERY 3 MONTHS AS DIRECTED 1 each 2   gabapentin (NEURONTIN) 400 MG capsule TAKE 1 CAPSULE BY MOUTH 3 TIMES DAILY 90 capsule 3   telmisartan-hydrochlorothiazide (MICARDIS HCT) 40-12.5 MG tablet Take 1 tablet by mouth daily. 90 tablet 3   traMADol (ULTRAM) 50 MG tablet TAKE 1 TABLET BY MOUTH 3 TIMES DAILY AS NEEDED FOR PAIN 90 tablet 0   diphenhydrAMINE (BENADRYL) 25 MG tablet Take 25 mg by mouth daily as needed for itching.     meclizine (ANTIVERT) 25 MG tablet TAKE 1 TABLET BY MOUTH 2 TIMES DAILY AS NEEDED FOR DIZZINESS 20 tablet 0   methocarbamol (ROBAXIN) 500 MG tablet TAKE  1 TABLET BY MOUTH AT BEDTIME AS NEEDED 90 tablet 0   PFIZER-BIONT COVID-19 VAC-TRIS SUSP injection       Results for orders placed or performed during the hospital encounter of 05/27/21 (from the past 48 hour(s))  Urine Drug Screen, Qualitative ([place] only)     Status: Abnormal   Collection Time: 05/27/21  8:33 AM  Result Value Ref Range   Tricyclic, Ur Screen NONE DETECTED NONE DETECTED   Amphetamines, Ur Screen NONE DETECTED NONE DETECTED   MDMA (Ecstasy)Ur Screen NONE DETECTED NONE DETECTED   Cocaine Metabolite,Ur [place] NONE DETECTED NONE DETECTED   Opiate, Ur Screen NONE DETECTED NONE DETECTED   Phencyclidine (PCP) Ur S NONE DETECTED NONE DETECTED   Cannabinoid 50 Ng, Ur [place] NONE DETECTED NONE DETECTED   Barbiturates, Ur Screen NONE DETECTED NONE DETECTED   Benzodiazepine, Ur Scrn POSITIVE (A) NONE DETECTED   Methadone Scn, Ur NONE DETECTED NONE DETECTED    Comment: (NOTE) Tricyclics + metabolites, urine    Cutoff 1000 ng/mL Amphetamines + metabolites, urine  Cutoff 1000 ng/mL MDMA (Ecstasy), urine              Cutoff 500 ng/mL Cocaine Metabolite, urine          Cutoff 300 ng/mL Opiate + metabolites, urine        Cutoff 300 ng/mL Phencyclidine (PCP), urine         Cutoff 25 ng/mL Cannabinoid, urine                 Cutoff 50 ng/mL Barbiturates + metabolites, urine  Cutoff 200  ng/mL Benzodiazepine, urine              Cutoff 200 ng/mL Methadone, urine                   Cutoff 300 ng/mL  The urine drug screen provides only a preliminary, unconfirmed analytical test result and should not be used for non-medical purposes. Clinical consideration and professional judgment should be applied to any positive drug screen result due to possible interfering substances. A more specific alternate chemical method must be used in order to obtain a confirmed analytical result. Gas chromatography / mass spectrometry (GC/MS) is the preferred confirm atory method. Performed at [facility], [address] [place], [place] [address]    No results found.  Review of Systems General ROS: Negative Psychological ROS: Negative Ophthalmic ROS: Negative ENT ROS: Negative Hematological and Lymphatic ROS: Negative  Endocrine ROS: Negative Respiratory ROS: Negative Cardiovascular ROS: Negative Gastrointestinal ROS: Negative Genito-Urinary ROS: Negative Musculoskeletal ROS: Positive for back pain Neurological ROS: Positive for leg pain, numbness Dermatological ROS: Negative  Blood pressure 132/81, pulse 72, temperature (!) 96.9 F (36.1 C), temperature source Oral, resp. rate 16, height $RemoveBe'5\' 5"'[id number]$  (1.651 m), weight 76.7 kg, SpO2 98 %. Physical Exam  General appearance: Alert, cooperative, in no acute distress Head: Normocephalic, atraumatic Eyes: Normal, EOM intact Oropharynx: Wearing facemask CV: Regular rate and rhythm Pulm: Clear to auscultation Back: Well-healed thoracic and right buttocks incision Ext: No edema in LE bilaterally  Neurologic exam:  Mental status: alertness: alert, affect: normal Speech: fluent and clear Motor: She is 5/ 5 strength throughout the left lower extremity, in the right lower extremity she is limited which includes 4-5 strength in hip flexion, 5 out of 5 in leg extension, 0-5 in dorsiflexion Sensory: intact to light touch in bilateral  lower extremities Gait: Using walker Assessment/Plan Proceed with removal  Deetta Perla, MD 05/27/2021,  9:07 AM

## 2021-05-28 ENCOUNTER — Observation Stay: Payer: PPO

## 2021-05-28 ENCOUNTER — Encounter: Payer: Self-pay | Admitting: Neurosurgery

## 2021-05-28 DIAGNOSIS — M7989 Other specified soft tissue disorders: Secondary | ICD-10-CM | POA: Diagnosis not present

## 2021-05-28 DIAGNOSIS — M546 Pain in thoracic spine: Secondary | ICD-10-CM | POA: Diagnosis not present

## 2021-05-28 DIAGNOSIS — M545 Low back pain, unspecified: Secondary | ICD-10-CM | POA: Diagnosis not present

## 2021-05-28 DIAGNOSIS — M48061 Spinal stenosis, lumbar region without neurogenic claudication: Secondary | ICD-10-CM | POA: Diagnosis not present

## 2021-05-28 DIAGNOSIS — M47814 Spondylosis without myelopathy or radiculopathy, thoracic region: Secondary | ICD-10-CM | POA: Diagnosis not present

## 2021-05-28 LAB — BASIC METABOLIC PANEL
Anion gap: 7 (ref 5–15)
BUN: 13 mg/dL (ref 6–20)
CO2: 28 mmol/L (ref 22–32)
Calcium: 8.8 mg/dL — ABNORMAL LOW (ref 8.9–10.3)
Chloride: 102 mmol/L (ref 98–111)
Creatinine, Ser: 0.99 mg/dL (ref 0.44–1.00)
GFR, Estimated: >60 mL/min (ref >60)
Glucose, Bld: 125 mg/dL — ABNORMAL HIGH (ref 70–99)
Potassium: 3.9 mmol/L (ref 3.5–5.1)
Sodium: 137 mmol/L (ref 135–145)

## 2021-05-28 MED ORDER — DIAZEPAM 5 MG PO TABS
5.0000 mg | ORAL_TABLET | Freq: Three times a day (TID) | ORAL | Status: DC | PRN
  Administered 2021-05-28: 5 mg via ORAL
  Filled 2021-05-28: qty 1

## 2021-05-28 NOTE — Evaluation (Signed)
Physical Therapy Evaluation Patient Details Name: [name] MRN: [id number] DOB: 01/30/1961 Today's Date: 05/28/2021   History of Present Illness  [name] is a 69yoF who comes to [facility] on 05/27/21 for elective neurosurgery with Dr. Deetta Perla. Pt underwent removal of failed thoracic SCS 8/1. PMH: thoracic SCS, lumbar decompression c subsequent RLE motor impairment, HLD, HTN, CP, PONV, Rt foot drop, syncope (Feb 2022). Pt required postop admission due to pain control issues and new subjective weakness of lower extremity. MRI of Lx adn Tx spines performed on 05/28/21. At baseline pt is a limited community AMB with Rt AFO and 4WW, still drives, grocery shops, was working with aquatic therapy prior to surgery.  Clinical Impression  Pt admitted with above diagnosis. Pt currently with functional limitations due to the deficits listed below (see "PT Problem List"). Upon entry, pt in bed, awake and agreeable to participate. The pt is alert, pleasant, interactive, and able to provide info regarding prior level of function, both in tolerance and independence. Supervision for bed mobility, minGuard for rise to standing. Short AMB with very small steps is slow, steady, with heavy UE support, but pt has large buckling episode that results in near fall with author. Additional AMB attempt deferred at this time, as near-fall causes increased pain at surgical site. Patient's performance this date reveals decreased ability, independence, and tolerance in performing all basic mobility required for performance of activities of daily living. Pt requires additional DME, close physical assistance, and cues for safe participate in mobility. Pt will benefit from skilled PT intervention to increase independence and safety with basic mobility in preparation for discharge to the venue listed below. MD/PA made aware of severe AMB impairment and near fall, pt does not appear close to baseline, nor safe for return to  home at present.       Follow Up Recommendations Follow surgeon's recommendation for DC plan and follow-up therapies;SNF;Supervision for mobility/OOB    Equipment Recommendations       Recommendations for Other Services       Precautions / Restrictions Precautions Precautions: Fall Precaution Comments: Rt AFO at baseline Restrictions Weight Bearing Restrictions: No      Mobility  Bed Mobility Overal bed mobility: Modified Independent                  Transfers Overall transfer level: Needs assistance Equipment used: 4-wheeled walker Transfers: Sit to/from Stand Sit to Stand: Min guard         General transfer comment: well established technique, reported global weakness; no assist needed; pt quickly transitions to standing with hands on 4WW.  Ambulation/Gait Ambulation/Gait assistance: Min guard Gait Distance (Feet): 6 Feet Assistive device: 4-wheeled walker       General Gait Details: small steps at bed side, once around FOB, stride opens up and as left leg is at toe-off in extension, full knee buckling occurs, author provides totalA to catch pt suspended in air using posteiror bear hug. Pt denies any syncopal prodrome, endorses only leg buckling and loss of support, pt able to return to steady stance with 4WW, take small steps to FOB to sit.  Stairs            Wheelchair Mobility    Modified Rankin (Stroke Patients Only)       Balance Overall balance assessment: Needs assistance Sitting-balance support: No upper extremity supported;Feet supported Sitting balance-Leahy Scale: Normal     Standing balance support: Bilateral upper extremity supported;During functional activity Standing balance-Leahy Scale:  Poor Standing balance comment: steady when static, AMB struggles with leg stability                             Pertinent Vitals/Pain Pain Assessment: 0-10 Pain Score: 8  Pain Location: mid back from surgery, lying on MRI,  etc Pain Descriptors / Indicators: Aching Pain Intervention(s): Limited activity within patient's tolerance;Monitored during session;Premedicated before session;Repositioned    Home Living Family/patient expects to be discharged to:: Private residence Living Arrangements: Spouse/significant other Available Help at Discharge: Family (husband works a few days/week 2-10p) Type of Home: House Home Access: Stairs to enter Entrance Stairs-Rails: Right Entrance Stairs-Number of Steps: 4 Home Layout: One level Home Equipment: Environmental consultant - 2 wheels;Cane - single point;Bedside commode;Shower seat;Walker - 4 wheels;Adaptive equipment;Wheelchair - manual Additional Comments: uses AFO on RLE since 2019 lumbar surgery    Prior Function Level of Independence: Independent with assistive device(s);Needs assistance   Gait / Transfers Assistance Needed: mostly homebound due to COVID concerns, but is able to perform limited community AMB with AFO and walker (progressive RLE weakness with too much walking)  ADL's / Homemaking Assistance Needed: mod I, sits in shower for bathing.  Comments: took a recent hiatus from aquatic therapy for this procedure.     Hand Dominance   Dominant Hand: Right    Extremity/Trunk Assessment   Upper Extremity Assessment Upper Extremity Assessment: Overall WFL for tasks assessed;Generalized weakness    Lower Extremity Assessment Lower Extremity Assessment: LLE deficits/detail (sensation intact and equla grossly; did not assess plantar sensation due to chronic abnormality) LLE Deficits / Details: rigid Left ankle, able to SLR with max effort, 3+/5 hip ABDCT; trace ankle DF/PF, trace hallux extension, no detectable small toe extension       Communication      Cognition Arousal/Alertness: Awake/alert Behavior During Therapy: WFL for tasks assessed/performed Overall Cognitive Status: Within Functional Limits for tasks assessed                                         General Comments      Exercises     Assessment/Plan    PT Assessment Patient needs continued PT services  PT Problem List Decreased strength;Decreased activity tolerance;Decreased balance;Decreased mobility;Decreased coordination       PT Treatment Interventions Gait training;Stair training;Functional mobility training;Therapeutic activities;Neuromuscular re-education;Cognitive remediation;Patient/family education;Balance training    PT Goals (Current goals can be found in the Care Plan section)  Acute Rehab PT Goals Patient Stated Goal: regain baseline moblity independence PT Goal Formulation: With patient Time For Goal Achievement: 06/11/21 Potential to Achieve Goals: Fair    Frequency 7X/week   Barriers to discharge Inaccessible home environment entry steps    Co-evaluation               AM-PAC PT "6 Clicks" Mobility  Outcome Measure Help needed turning from your back to your side while in a flat bed without using bedrails?: None Help needed moving from lying on your back to sitting on the side of a flat bed without using bedrails?: None Help needed moving to and from a bed to a chair (including a wheelchair)?: A Little Help needed standing up from a chair using your arms (e.g., wheelchair or bedside chair)?: A Little Help needed to walk in hospital room?: Total Help needed climbing 3-5 steps with a railing? :  Total 6 Click Score: 16    End of Session Equipment Utilized During Treatment: Gait belt Activity Tolerance: Patient limited by fatigue;Patient limited by pain;Treatment limited secondary to medical complications (Comment) (acute back pain exacerbation after near fall) Patient left: in chair;with call bell/phone within reach Nurse Communication: Mobility status (neurosurgivcal team (regarding near fall)) PT Visit Diagnosis: Unsteadiness on feet (R26.81);Other abnormalities of gait and mobility (R26.89);Muscle weakness (generalized)  (M62.81);Difficulty in walking, not elsewhere classified (R26.2);Other symptoms and signs involving the nervous system (R29.898)    Time: [id number] PT Time Calculation (min) (ACUTE ONLY): 46 min   Charges:   PT Evaluation $PT Eval High Complexity: 1 High         4:04 PM, 05/28/21 Etta Grandchild, PT, DPT Physical Therapist - [facility]  [phone] ([place])   [place] C 05/28/2021, 4:00 PM

## 2021-05-28 NOTE — Progress Notes (Signed)
Progress Note  History: [name] is here after uncomplicated removal of SCS. Patient was admitted overnight due to uncontrolled pain and subjective lower extremity weakness.   Physical Exam: Vitals:   05/28/21 0000 05/28/21 0317  BP: 117/70 (!) 148/70  Pulse: 66 68  Resp: 16 17  Temp: (!) 97.4 F (36.3 C) (!) 97.5 F (36.4 C)  SpO2: 96% 97%    AA Ox3 CNI Strength:5/5 throughout BUE.  4- right HF, KE, DF 5/5 EHL and PF 3/5 throughout LLE except 0/5 DF and EHL. 4+/5 PF  Intact sensation to light touch BLE.  2+ patellar reflexes Incisions: C/D/I   Data:  Recent Labs  Lab 05/28/21 0715  NA 137  K 3.9  CL 102  CO2 28  BUN 13  CREATININE 0.99  GLUCOSE 125*  CALCIUM 8.8*   No results for input(s): AST, ALT, ALKPHOS in the last 168 hours.  Invalid input(s): TBILI   No results for input(s): WBC, HGB, HCT, PLT in the last 168 hours. No results for input(s): APTT, INR in the last 168 hours.       Other tests/results: MRI T and L spine pending  Assessment/Plan:  [name] is a 60 y.o female POD#1 from removal of SCS. She was admitted overnight secondary to pain.   - mobilize; PT/OT consulted  - pain control. PO only  - DVT prophylaxis: Continues SCDs. Will defer chemical DVT ppx until MRI completed.  - MRI T and L spine ordered due to new lower extremity weakness  - dispo planning   Cooper Render PA-C Department of Neurosurgery

## 2021-05-29 LAB — CREATININE, SERUM
Creatinine, Ser: 1.06 mg/dL — ABNORMAL HIGH (ref 0.44–1.00)
GFR, Estimated: >60 mL/min (ref >60)

## 2021-05-29 LAB — CBC
HCT: 33.7 % — ABNORMAL LOW (ref 36.0–46.0)
Hemoglobin: 11.1 g/dL — ABNORMAL LOW (ref 12.0–15.0)
MCH: 30.1 pg (ref 26.0–34.0)
MCHC: 32.9 g/dL (ref 30.0–36.0)
MCV: 91.3 fL (ref 80.0–100.0)
Platelets: 242 10*3/uL (ref 150–400)
RBC: 3.69 MIL/uL — ABNORMAL LOW (ref 3.87–5.11)
RDW: 13.3 % (ref 11.5–15.5)
WBC: 12.8 10*3/uL — ABNORMAL HIGH (ref 4.0–10.5)
nRBC: 0 % (ref 0.0–0.2)

## 2021-05-29 MED ORDER — DIPHENHYDRAMINE HCL 25 MG PO TABS
25.0000 mg | ORAL_TABLET | Freq: Three times a day (TID) | ORAL | Status: DC | PRN
  Filled 2021-05-29: qty 1

## 2021-05-29 MED ORDER — DIPHENHYDRAMINE HCL 25 MG PO CAPS
25.0000 mg | ORAL_CAPSULE | Freq: Three times a day (TID) | ORAL | Status: DC | PRN
  Administered 2021-05-30 – 2021-05-31 (×2): 25 mg via ORAL
  Filled 2021-05-29: qty 1

## 2021-05-29 MED ORDER — ENOXAPARIN SODIUM 40 MG/0.4ML IJ SOSY
40.0000 mg | PREFILLED_SYRINGE | INTRAMUSCULAR | Status: DC
  Administered 2021-05-29 – 2021-05-31 (×3): 40 mg via SUBCUTANEOUS
  Filled 2021-05-29 (×3): qty 0.4

## 2021-05-29 NOTE — Evaluation (Signed)
Occupational Therapy Evaluation Patient Details Name: Tabby Sirles MRN: [id number] DOB: 22-Dec-1960 Today's Date: 05/29/2021    History of Present Illness Blayze Kober is a 78yoF who comes to [facility] on 05/27/21 for elective neurosurgery with Dr. Deetta Perla. Pt underwent removal of failed thoracic SCS 8/1. PMH: thoracic SCS, lumbar decompression c subsequent RLE motor impairment, HLD, HTN, CP, PONV, Rt foot drop, syncope (Feb 2022). Pt required postop admission due to pain control issues and new subjective weakness of lower extremity. MRI of Lx adn Tx spines performed on 05/28/21. At baseline pt is a limited community AMB with Rt AFO and 4WW, still drives, grocery shops, was working with aquatic therapy prior to surgery.   Clinical Impression   Ms Rotella was seen for OT evaluation this date. Prior to hospital admission, pt was MOD I for mobility and ADLs using 4WW, sits for bathing/grooming. Pt lives with husband in home c 4 STE. Pt presents to acute OT demonstrating impaired ADL performance and functional mobility 2/2 decreased activity tolerance, functional coordination/strength/balance deficits, and decreased LB access. Pt currently requires MOD A doff R AFO, MAX A doff B shoes seated EOC. MIN A + 4WW functional reach standing EOC, assist for standing balance. Pt requires BUE to complete tooth brushing seated EOC. Pt would benefit from skilled OT to address noted impairments and functional limitations (see below for any additional details) in order to maximize safety and independence while minimizing falls risk and caregiver burden. Upon hospital discharge, recommend CIR to maximize pt safety and return to PLOF.     Follow Up Recommendations  Follow surgeon's recommendation for DC plan and follow-up therapies;CIR;Supervision - Intermittent    Equipment Recommendations  None recommended by OT    Recommendations for Other Services       Precautions / Restrictions  Precautions Precautions: Fall Precaution Comments: Rt AFO at baseline Restrictions Weight Bearing Restrictions: No      Mobility Bed Mobility Overal bed mobility: Modified Independent             General bed mobility comments: Pt received in chair left in chair    Transfers Overall transfer level: Needs assistance Equipment used: 4-wheeled walker Transfers: Sit to/from Stand Sit to Stand: Min assist         General transfer comment: no physical assist to stand slowly, MIN A for eccentric control    Balance Overall balance assessment: Needs assistance Sitting-balance support: No upper extremity supported;Feet supported Sitting balance-Leahy Scale: Good     Standing balance support: Single extremity supported;During functional activity Standing balance-Leahy Scale: Poor                             ADL either performed or assessed with clinical judgement   ADL Overall ADL's : Needs assistance/impaired                                       General ADL Comments: MOD A doff R AFO, MAX A doff B shoes seated EOC. MIN A + 4WW functional reach standing EOC, assist for standing balance. Pt requires BUE to complete tooth brushing seated EOC      Pertinent Vitals/Pain Pain Assessment: Faces Pain Score: 8  Faces Pain Scale: Hurts even more Pain Location: mid back at surgical site Pain Descriptors / Indicators: Operative site guarding Pain Intervention(s): Limited activity within patient's tolerance;Repositioned  Hand Dominance Right   Extremity/Trunk Assessment Upper Extremity Assessment Upper Extremity Assessment: RUE deficits/detail;LUE deficits/detail RUE Deficits / Details: 3+/5 grossly, decreased speed/coordinatino for palm to finger translation RUE Coordination: decreased fine motor LUE Deficits / Details: 3+/5 grossly, decreased speed/coordinatino for palm to finger translation LUE Sensation: history of peripheral  neuropathy LUE Coordination: decreased fine motor   Lower Extremity Assessment Lower Extremity Assessment: Defer to PT evaluation       Communication Communication Communication: No difficulties   Cognition Arousal/Alertness: Awake/alert Behavior During Therapy: WFL for tasks assessed/performed Overall Cognitive Status: Within Functional Limits for tasks assessed                                     General Comments  Standing: BP 115/66, MAP 79, HR 78    Exercises Exercises: Other exercises Other Exercises Other Exercises: Pt edcuated re: OT role, DME recs, d/c recs, falls prevention, ECS, HEP Other Exercises: LBD, sit<>stand, sitting/standing balance/tolerance, grooming   Shoulder Instructions      Home Living Family/patient expects to be discharged to:: Private residence Living Arrangements: Spouse/significant other Available Help at Discharge: Family Type of Home: House Home Access: Stairs to enter Technical brewer of Steps: 4 Entrance Stairs-Rails: Right Home Layout: One level     Bathroom Shower/Tub: Occupational psychologist: Handicapped height     Home Equipment: Environmental consultant - 2 wheels;Cane - single point;Bedside commode;Shower seat;Walker - 4 wheels;Adaptive equipment;Wheelchair - manual;Other (comment) (adjustable height bed) Adaptive Equipment: Reacher;Long-handled sponge;Sock aid;Long-handled shoe horn Additional Comments: uses AFO on RLE since 2019 lumbar surgery      Prior Functioning/Environment Level of Independence: Independent with assistive device(s);Needs assistance  Gait / Transfers Assistance Needed: mostly homebound due to COVID concerns, but is able to perform limited community AMB with AFO and walker (progressive RLE weakness with too much walking) ADL's / Homemaking Assistance Needed: mod I, sits in shower for bathing.            OT Problem List: Decreased strength;Decreased range of motion;Impaired balance  (sitting and/or standing);Decreased activity tolerance;Impaired UE functional use      OT Treatment/Interventions: Self-care/ADL training;Therapeutic exercise;Energy conservation;DME and/or AE instruction;Therapeutic activities;Patient/family education;Balance training    OT Goals(Current goals can be found in the care plan section) Acute Rehab OT Goals Patient Stated Goal: return to PLOF OT Goal Formulation: With patient Time For Goal Achievement: 06/12/21 Potential to Achieve Goals: Good ADL Goals Pt Will Perform Grooming: with modified independence;sitting Pt Will Perform Lower Body Dressing: with modified independence;sitting/lateral leans Pt Will Transfer to Toilet: with modified independence;ambulating;regular height toilet (c LRAD PRN)  OT Frequency: Min 3X/week    AM-PAC OT "6 Clicks" Daily Activity     Outcome Measure Help from another person eating meals?: A Little Help from another person taking care of personal grooming?: A Little Help from another person toileting, which includes using toliet, bedpan, or urinal?: A Little Help from another person bathing (including washing, rinsing, drying)?: A Lot Help from another person to put on and taking off regular upper body clothing?: A Little Help from another person to put on and taking off regular lower body clothing?: A Lot 6 Click Score: 16   End of Session Equipment Utilized During Treatment: Other (comment) [id number]) Nurse Communication: Mobility status  Activity Tolerance: Patient tolerated treatment well Patient left: in chair;with call bell/phone within reach  OT Visit Diagnosis: Other abnormalities of gait and  mobility (R26.89);Muscle weakness (generalized) (M62.81)                Time: [id number] OT Time Calculation (min): 20 min Charges:  OT General Charges $OT Visit: 1 Visit OT Evaluation $OT Eval Moderate Complexity: 1 Mod OT Treatments $Self Care/Home Management : 8-22 mins  Dessie Coma, M.S. OTR/L   05/29/21, 12:37 PM  ascom [phone]

## 2021-05-29 NOTE — Progress Notes (Signed)
Physical Therapy Treatment Patient Details Name: [name] MRN: [id number] DOB: Apr 21, 1961 Today's Date: 05/29/2021    History of Present Illness [name] is a 89yoF who comes to [facility] on 05/27/21 for elective neurosurgery with Dr. Deetta Perla. Pt underwent removal of failed thoracic SCS 8/1. PMH: thoracic SCS, lumbar decompression c subsequent RLE motor impairment, HLD, HTN, CP, PONV, Rt foot drop, syncope (Feb 2022). Pt required postop admission due to pain control issues and new subjective weakness of lower extremity. MRI of Lx adn Tx spines performed on 05/28/21. At baseline pt is a limited community AMB with Rt AFO and 4WW, still drives, grocery shops, was working with aquatic therapy prior to surgery.    PT Comments    Pt in bed upon entry agreeable to session. Pt guided throughout LLE exercises in bed to assess any progression in strength, pt remains very weak in general, requires heavy physical assist for ROM in supine. Ankle PF/DF remain trace only, only trace hallux extension is seen, no small toe movement seen (all unchanged since previous day). Pt still able to perform bed mobility and transfers safely at supervision to minGuard assist. Pt advanced AMB to 22f, but has consistent degradation of BLE motor control while AMB, eventually dragging each foot in swing phase, BLE fasciculation, and knee prebuckling bilat. AChief Strategy Officerutilized a chair follow today for safety given her full out buckling yesterday. Post AMB BP 121/78, 75bpm. Pt also c/o pulling sensation pain at lumbar spine area with increasing hip flexion angle during bed exercises. Pt left in recliner with OT at ENorthrop     Follow Up Recommendations  Follow surgeon's recommendation for DC plan and follow-up therapies;Supervision for mobility/OOB;CIR     Equipment Recommendations  None recommended by PT    Recommendations for Other Services       Precautions / Restrictions Precautions Precautions:  Fall Precaution Comments: Rt AFO at baseline Restrictions Weight Bearing Restrictions: No    Mobility  Bed Mobility Overal bed mobility: Modified Independent                  Transfers Overall transfer level: Needs assistance Equipment used: 4-wheeled walker Transfers: Sit to/from Stand Sit to Stand: Min guard         General transfer comment: well established technique, reported global weakness; no assist needed; pt quickly transitions to standing with hands on 4WW.  Ambulation/Gait Ambulation/Gait assistance: Min guard;Min assist (chair follow) Gait Distance (Feet): 35 Feet (241f then 3548fAssistive device: 4-wheeled walker Gait Pattern/deviations: Step-to pattern (foot clearnance is awful bilat despite Rt AFO, indicative of loss of Left gluteal contol in Rt swingphase; Pt also struggles with propulsion/advancement of LLE swingphase, made worse by isometric ankle DF at -10 degrees, but driven primarily by) Gait velocity: 0.70m59m General Gait Details: taken to hallway to focus on straight plan AMB without requisite turning or obstacles. Regular cues for shorter steps, as knee instability becomes more pronounced as step length lengthens. Pt has bilateral facilculation and legs and decomposition of BLE postural control ~25-35ft92frein she asks for emergent facilitation to return to sitting due to concerns of buckling to floor (which nearly happened on POD1).   Stairs             Wheelchair Mobility    Modified Rankin (Stroke Patients Only)       Balance  Cognition Arousal/Alertness: Awake/alert Behavior During Therapy: WFL for tasks assessed/performed Overall Cognitive Status: Within Functional Limits for tasks assessed                                        Exercises General Exercises - Lower Extremity Ankle Circles/Pumps: Limitations Ankle Circles/Pumps Limitations:  baseline foot drop right (stiff and rigid ankle contracture precludes ankle DF to 0 degrees); Left ankle DF/PF remain trace activation only, trace hallux extension, no palpable small toe activation. Short Arc QuadSinclair Ship;Left;10 reps;Supine;Limitations Short Arc Quad Limitations: minA required Heel Slides: AAROM;Left;15 reps;Supine;Limitations Heel Slides Limitations: maxA required, lumbar spine pulling pain increased with degrees of hip flexion Hip ABduction/ADduction: AAROM;Left;Limitations Hip Abduction/Adduction Limitations: min-modA required, leg very weak, slow velocity    General Comments        Pertinent Vitals/Pain Pain Assessment: 0-10 Pain Score: 8  Pain Descriptors / Indicators: Operative site guarding Pain Intervention(s): Limited activity within patient's tolerance;Premedicated before session;Repositioned    Home Living                      Prior Function            PT Goals (current goals can now be found in the care plan section) Acute Rehab PT Goals Patient Stated Goal: regain baseline moblity independence PT Goal Formulation: With patient Time For Goal Achievement: 06/11/21 Potential to Achieve Goals: Fair Progress towards PT goals: Progressing toward goals    Frequency    7X/week      PT Plan Discharge plan needs to be updated    Co-evaluation              AM-PAC PT "6 Clicks" Mobility   Outcome Measure  Help needed turning from your back to your side while in a flat bed without using bedrails?: A Little Help needed moving from lying on your back to sitting on the side of a flat bed without using bedrails?: A Little Help needed moving to and from a bed to a chair (including a wheelchair)?: A Little Help needed standing up from a chair using your arms (e.g., wheelchair or bedside chair)?: A Little Help needed to walk in hospital room?: A Lot Help needed climbing 3-5 steps with a railing? : Total 6 Click Score: 15    End of  Session Equipment Utilized During Treatment: Gait belt (chair follow, Rt AFO, pt's [id number]) Activity Tolerance: Patient limited by fatigue;Patient limited by pain;Treatment limited secondary to medical complications (Comment) Patient left: in chair;with nursing/sitter in room (with OT) Nurse Communication: Mobility status PT Visit Diagnosis: Unsteadiness on feet (R26.81);Other abnormalities of gait and mobility (R26.89);Muscle weakness (generalized) (M62.81);Difficulty in walking, not elsewhere classified (R26.2);Other symptoms and signs involving the nervous system (R29.898)     Time: [id number] PT Time Calculation (min) (ACUTE ONLY): 43 min  Charges:  $Gait Training: 23-37 mins $Therapeutic Exercise: 8-22 mins                    10:46 AM, 05/29/21 Etta Grandchild, PT, DPT Physical Therapist - [facility]  [phone] ([place])     [place] C 05/29/2021, 10:42 AM

## 2021-05-29 NOTE — Plan of Care (Signed)
No acute events during the night. VSS. PRN oxycodone administered overnight. Safety maintained. Bed low. Wheels locked. Belongings within reach.

## 2021-05-29 NOTE — Progress Notes (Signed)
Inpatient Rehabilitation Admissions Coordinator   I received a call from patient requesting admit to CIR at [facility] for her rehab. She sees Dr. Naaman Plummer as her physiatrist. I have ordered OT eval and notified acute team of pt's preference. Once I have those assessments today I will begin Auth with [employer].  Danne Baxter, RN, MSN Rehab Admissions Coordinator [phone] 05/29/2021 8:28 AM

## 2021-05-29 NOTE — Progress Notes (Signed)
Progress Note  History: China Gaut is POD#2 from uncomplicated removal of SCS. MRI L and T spine done yesterday without any explanation for her new left leg weakness. She reports continued back pain and concern for constipation as she has not had a BM since Sunday. She expresses desire to go to inpatient rehab at discharge.   Physical Exam: Vitals:   05/29/21 0354 05/29/21 0734  BP: 122/77 (!) 173/89  Pulse: 66 68  Resp:  16  Temp: 97.7 F (36.5 C) 98.2 F (36.8 C)  SpO2: 100% 100%    AA Ox3 sitting up in bed applying skin care  CNI Strength:5/5 throughout BUE.  4- right HF, KE, DF 4 EHL and PF 3/5 throughout LLE except 0/5 DF and EHL. 4-/5 PF  Intact sensation to light touch BLE.  Incisions: midline and right flank incision covered with ABD which is clean and dry. Left flank incision c/d/I with dermabond in place.   ABD: none tender without distention.   Data:  Recent Labs  Lab 05/28/21 0715  NA 137  K 3.9  CL 102  CO2 28  BUN 13  CREATININE 0.99  GLUCOSE 125*  CALCIUM 8.8*    No results for input(s): AST, ALT, ALKPHOS in the last 168 hours.  Invalid input(s): TBILI   No results for input(s): WBC, HGB, HCT, PLT in the last 168 hours. No results for input(s): APTT, INR in the last 168 hours.       Other tests/results: MRI T and L spine without compressive pathology  Assessment/Plan:  Etienne Joss is a 60 y.o female POD#2 from removal of SCS. She was admitted overnight secondary to pain.   - mobilize; PT consulted. Concern for patients mobility yesterday.  - pain control. PO only  - DVT prophylaxis: Continues SCDs.  - Benadryl PRN for itching  - continue bowel regimen  - dispo planning: will likely require discharge to inpatient rehab 2/2 to continued weakness pending PT evaluation. Otherwise medically stable for discharge.   Cooper Render PA-C Department of Neurosurgery

## 2021-05-29 NOTE — Anesthesia Postprocedure Evaluation (Signed)
Anesthesia Post Note  Patient: [name]  Procedure(s) Performed: LUMBAR SPINAL CORD STIMULATOR REMOVAL  Patient location during evaluation: PACU Anesthesia Type: General Level of consciousness: awake and alert Pain management: pain level controlled Vital Signs Assessment: post-procedure vital signs reviewed and stable Respiratory status: spontaneous breathing, nonlabored ventilation, respiratory function stable and patient connected to nasal cannula oxygen Cardiovascular status: blood pressure returned to baseline and stable Postop Assessment: no apparent nausea or vomiting Anesthetic complications: no   No notable events documented.   Last Vitals:  Vitals:   05/29/21 0734 05/29/21 1022  BP: (!) 173/89 121/78  Pulse: 68 75  Resp: 16   Temp: 36.8 C   SpO2: 100%     Last Pain:  Vitals:   05/29/21 0800  TempSrc:   PainSc: [place] Ennen

## 2021-05-29 NOTE — Progress Notes (Signed)
Inpatient Rehabilitation Admissions Coordinator   I will begin Auth with [employer] for a possible Cir admit per patient request.  Danne Baxter, RN, MSN Rehab Admissions Coordinator [phone] 05/29/2021 1:20 PM

## 2021-05-30 DIAGNOSIS — T85890A Other specified complication of nervous system prosthetic devices, implants and grafts, initial encounter: Secondary | ICD-10-CM | POA: Diagnosis present

## 2021-05-30 DIAGNOSIS — Z888 Allergy status to other drugs, medicaments and biological substances status: Secondary | ICD-10-CM | POA: Diagnosis not present

## 2021-05-30 DIAGNOSIS — Z793 Long term (current) use of hormonal contraceptives: Secondary | ICD-10-CM | POA: Diagnosis not present

## 2021-05-30 DIAGNOSIS — G894 Chronic pain syndrome: Secondary | ICD-10-CM | POA: Diagnosis present

## 2021-05-30 DIAGNOSIS — Z880 Allergy status to penicillin: Secondary | ICD-10-CM | POA: Diagnosis not present

## 2021-05-30 DIAGNOSIS — Z90710 Acquired absence of both cervix and uterus: Secondary | ICD-10-CM | POA: Diagnosis not present

## 2021-05-30 DIAGNOSIS — Z881 Allergy status to other antibiotic agents status: Secondary | ICD-10-CM | POA: Diagnosis not present

## 2021-05-30 DIAGNOSIS — E785 Hyperlipidemia, unspecified: Secondary | ICD-10-CM | POA: Diagnosis present

## 2021-05-30 DIAGNOSIS — Z8249 Family history of ischemic heart disease and other diseases of the circulatory system: Secondary | ICD-10-CM | POA: Diagnosis not present

## 2021-05-30 DIAGNOSIS — M48061 Spinal stenosis, lumbar region without neurogenic claudication: Secondary | ICD-10-CM | POA: Diagnosis present

## 2021-05-30 DIAGNOSIS — Z883 Allergy status to other anti-infective agents status: Secondary | ICD-10-CM | POA: Diagnosis not present

## 2021-05-30 DIAGNOSIS — I10 Essential (primary) hypertension: Secondary | ICD-10-CM | POA: Diagnosis present

## 2021-05-30 DIAGNOSIS — Z79899 Other long term (current) drug therapy: Secondary | ICD-10-CM | POA: Diagnosis not present

## 2021-05-30 DIAGNOSIS — Y831 Surgical operation with implant of artificial internal device as the cause of abnormal reaction of the patient, or of later complication, without mention of misadventure at the time of the procedure: Secondary | ICD-10-CM | POA: Diagnosis present

## 2021-05-30 DIAGNOSIS — Z975 Presence of (intrauterine) contraceptive device: Secondary | ICD-10-CM | POA: Diagnosis not present

## 2021-05-30 DIAGNOSIS — M21371 Foot drop, right foot: Secondary | ICD-10-CM | POA: Diagnosis present

## 2021-05-30 DIAGNOSIS — F419 Anxiety disorder, unspecified: Secondary | ICD-10-CM | POA: Diagnosis present

## 2021-05-30 DIAGNOSIS — Z91018 Allergy to other foods: Secondary | ICD-10-CM | POA: Diagnosis not present

## 2021-05-30 DIAGNOSIS — Z91040 Latex allergy status: Secondary | ICD-10-CM | POA: Diagnosis not present

## 2021-05-30 DIAGNOSIS — Z9682 Presence of neurostimulator: Secondary | ICD-10-CM | POA: Diagnosis not present

## 2021-05-30 DIAGNOSIS — I700 Atherosclerosis of aorta: Secondary | ICD-10-CM | POA: Diagnosis present

## 2021-05-30 DIAGNOSIS — Z884 Allergy status to anesthetic agent status: Secondary | ICD-10-CM | POA: Diagnosis not present

## 2021-05-30 DIAGNOSIS — K219 Gastro-esophageal reflux disease without esophagitis: Secondary | ICD-10-CM | POA: Diagnosis present

## 2021-05-30 NOTE — TOC Progression Note (Signed)
Transition of Care [facility]) - Progression Note    Patient Details  Name: Shellee Bisson MRN: [id number] Date of Birth: [birth year]  Transition of Care [facility]) CM/SW Contact  Su Hilt, RN Phone Number: 05/30/2021, 10:29 AM  Clinical Narrative:    Due to the patient's insurance denial for CIR, she would like to go to STR SNF in [place], [place] obtained, FL2 completed, [place] for [facility] sent, once offers are obtained will review with the patient for choice and then get ins approval        Expected Discharge Plan and Services           Expected Discharge Date: 05/27/21                                     Social Determinants of Health (SDOH) Interventions    Readmission Risk Interventions No flowsheet data found.

## 2021-05-30 NOTE — Plan of Care (Signed)
  Problem: Education: Goal: Knowledge of General Education information will improve Description: Including pain rating scale, medication(s)/side effects and non-pharmacologic comfort measures Outcome: Progressing   Problem: Health Behavior/Discharge Planning: Goal: Ability to manage health-related needs will improve Outcome: Progressing   Problem: Clinical Measurements: Goal: Ability to maintain clinical measurements within normal limits will improve Outcome: Progressing Goal: Will remain free from infection Outcome: Progressing Goal: Respiratory complications will improve Outcome: Progressing   Problem: Activity: Goal: Risk for activity intolerance will decrease Outcome: Progressing   Problem: Nutrition: Goal: Adequate nutrition will be maintained Outcome: Progressing   Problem: Coping: Goal: Level of anxiety will decrease Outcome: Progressing   Problem: Elimination: Goal: Will not experience complications related to bowel motility Outcome: Progressing Goal: Will not experience complications related to urinary retention Outcome: Progressing   Problem: Pain Managment: Goal: General experience of comfort will improve Outcome: Progressing   Problem: Safety: Goal: Ability to remain free from injury will improve Outcome: Progressing   Problem: Activity: Goal: Ability to avoid complications of mobility impairment will improve Outcome: Progressing Goal: Ability to tolerate increased activity will improve Outcome: Progressing Goal: Will remain free from falls Outcome: Progressing   Problem: Bowel/Gastric: Goal: Gastrointestinal status for postoperative course will improve Outcome: Progressing   Problem: Clinical Measurements: Goal: Ability to maintain clinical measurements within normal limits will improve Outcome: Progressing Goal: Postoperative complications will be avoided or minimized Outcome: Progressing

## 2021-05-30 NOTE — Progress Notes (Signed)
Progress Note  History: [name] is POD#3 from uncomplicated removal of SCS.   POD3: She is doing well.  She has continued discomfort as expected.  No new neurologic issues. As per discussion yesterday, we are awaiting insurance decision on rehab.  Physical Exam: Vitals:   05/30/21 0506 05/30/21 0724  BP: 104/64 (!) 158/81  Pulse: 71 63  Resp: 19 16  Temp: 98 F (36.7 C) 98.2 F (36.8 C)  SpO2: 94% 100%    AA Ox3 sitting up in bed CNI Strength:5/5 throughout BUE.  4- right HF, KE, DF 4 EHL and PF 3/5 throughout LLE except 1/5 DF and EHL. 4-/5 PF  Intact sensation to light touch BLE.  Incisions: midline and right flank incision covered with ABD which is clean and dry. Left flank incision c/d/I with dermabond in place.   ABD: none tender without distention.   Data:  Recent Labs  Lab 05/28/21 0715 05/29/21 0857  NA 137  --   K 3.9  --   CL 102  --   CO2 28  --   BUN 13  --   CREATININE 0.99 1.06*  GLUCOSE 125*  --   CALCIUM 8.8*  --    No results for input(s): AST, ALT, ALKPHOS in the last 168 hours.  Invalid input(s): TBILI   Recent Labs  Lab 05/29/21 0857  WBC 12.8*  HGB 11.1*  HCT 33.7*  PLT 242   No results for input(s): APTT, INR in the last 168 hours.       Other tests/results: MRI T and L spine without compressive pathology  Assessment/Plan:  [name] is a 60 y.o female POD#3 from removal of SCS.   - mobilize; PT consulted. Concern for patients mobility yesterday.  - pain control. PO only  - DVT prophylaxis: Continues SCDs.  - Benadryl PRN for itching  - continue bowel regimen  - dispo planning: awaiting insurance decision  Meade Maw MD Department of Neurosurgery

## 2021-05-30 NOTE — Progress Notes (Signed)
Occupational Therapy Treatment Patient Details Name: [name] MRN: [id number] DOB: 09-01-1961 Today's Date: 05/30/2021    History of present illness [name] is a 61yoF who comes to [facility] on 05/27/21 for elective neurosurgery with Dr. Deetta Perla. Pt underwent removal of failed thoracic SCS 8/1. PMH: thoracic SCS, lumbar decompression c subsequent RLE motor impairment, HLD, HTN, CP, PONV, Rt foot drop, syncope (Feb 2022). Pt required postop admission due to pain control issues and new subjective weakness of lower extremity. MRI of Lx adn Tx spines performed on 05/28/21. At baseline pt is a limited community AMB with Rt AFO and 4WW, still drives, grocery shops, was working with aquatic therapy prior to surgery.   OT comments  Pt is greeted in bedside chair, agreeable to OT tx session. Tx session targeting LB ADLs, dynamic standing balance, endurance in preparation for safe, independent ADL completion. Pt requires MOD A for LB dressing with increased time, reports use of AE  at home. Pt performs 3 STS with pt reporting BLE weakness after approx 1 minute. Rest breaks provided in between. Sitting BP: 108/74; standing BP: 96/77, return to sitting 115/73. Pt performs dynamic standing tasks while in standing with CGA.Pt is left as received, NAD, all needs met. OT will continue to follow while admitted.    Follow Up Recommendations  Follow surgeon's recommendation for DC plan and follow-up therapies;CIR;Supervision - Intermittent    Equipment Recommendations  None recommended by OT    Recommendations for Other Services      Precautions / Restrictions Precautions Precautions: Fall Precaution Comments: Rt AFO at baseline       Mobility Bed Mobility               General bed mobility comments: pt recieved and left in chair    Transfers Overall transfer level: Needs assistance Equipment used: 4-wheeled walker Transfers: Sit to/from Stand Sit to Stand: Supervision          General transfer comment: STS 3x with dynamic reaching tasks targeted. Assitance required for stand>sit due to pt reporting weakness in B legs    Balance Overall balance assessment: Needs assistance Sitting-balance support: No upper extremity supported;Feet supported Sitting balance-Leahy Scale: Good     Standing balance support: Single extremity supported;During functional activity Standing balance-Leahy Scale: Fair Standing balance comment: Fair dynamic standing balance with reaching tasks                           ADL either performed or assessed with clinical judgement   ADL Overall ADL's : Needs assistance/impaired                     Lower Body Dressing: Moderate assistance Lower Body Dressing Details (indicate cue type and reason): Don/doffing socks, R AFO, B shoes                                       Cognition Arousal/Alertness: Awake/alert Behavior During Therapy: WFL for tasks assessed/performed Overall Cognitive Status: Within Functional Limits for tasks assessed                                          Exercises Total Joint Exercises Long Arc Quad: AROM;Left;5 reps;Seated;Limitations Long [employer] Limitations: slow, aterixis appearing  pattern, unable to generate force to achieve TKE          General Comments sitting BP: 108/74; standing BP: 96/77, return to sitting 115/73    Pertinent Vitals/ Pain       Pain Assessment: 0-10 Pain Score: 7  Pain Location: mid back at surgical site reports "stiches" Pain Intervention(s): Limited activity within patient's tolerance;Monitored during session                                                          Frequency  Min 3X/week        Progress Toward Goals  OT Goals(current goals can now be found in the care plan section)  Progress towards OT goals: Progressing toward goals  Acute Rehab OT Goals Patient Stated Goal: get  stronger OT Goal Formulation: With patient Time For Goal Achievement: 06/13/21 Potential to Achieve Goals: Good  Plan Discharge plan remains appropriate                     AM-PAC OT "6 Clicks" Daily Activity     Outcome Measure   Help from another person eating meals?: None Help from another person taking care of personal grooming?: A Little Help from another person toileting, which includes using toliet, bedpan, or urinal?: A Little Help from another person bathing (including washing, rinsing, drying)?: A Little Help from another person to put on and taking off regular upper body clothing?: A Little Help from another person to put on and taking off regular lower body clothing?: A Little 6 Click Score: 19    End of Session Equipment Utilized During Treatment: Gait belt;Other (comment) (rollator)  OT Visit Diagnosis: Other abnormalities of gait and mobility (R26.89);Muscle weakness (generalized) (M62.81)   Activity Tolerance Patient tolerated treatment well   Patient Left in chair;with call bell/phone within reach   Nurse Communication          Time: 7169-6789 OT Time Calculation (min): 37 min  Charges: OT General Charges $OT Visit: 1 Visit OT Treatments $Therapeutic Activity: 23-37 mins  Shanon Payor, OTD OTR/L  05/30/21, 1:12 PM

## 2021-05-30 NOTE — Progress Notes (Signed)
Physical Therapy Treatment Patient Details Name: [name] MRN: [id number] DOB: 1961/09/03 Today's Date: 05/30/2021    History of Present Illness      PT Comments    Pt in chair upon arrival, agreeable to session. Pt still performing bed mobility and transfers at supervision level, more labored than baseline, but similar technique and level of independence. Transfers returning to sitting requires minA almost every time mostly because of decline in BLE motor control with standing >60 seconds. Pt AMB 2 trails today, distance limited by need to stop due to loss of leg control bilat, worsening balance,mild legs fasciculation. Trial of friction reducing modification to bilat forefoot of shoes to improve accuracy in step placement is successful, but does not improve overall tolerated distance. As pt presentation was suspicious for orthostatis yesterday and today, attempted serial vitals: noted BP drop from resting --> sitting --> standing, but pt unable to tolerate standing to 3 minutes for BP assessment due to loss of BLE control. Pt left up in chair at end of session, husband in room. RN made aware of concerns over orthostasis.  Orthostatic VS for the past 24 hrs:  BP- Sitting Pulse- Sitting BP- Standing at 0 minutes Pulse- Standing at 0 minutes  05/30/21 1015 98/60 74 (!) 84/50 70    *was 158/28mmHg at 7:20a vitals check; HCTZ received at 1032.     Follow Up Recommendations  Follow surgeon's recommendation for DC plan and follow-up therapies;Supervision for mobility/OOB;SNF     Equipment Recommendations  None recommended by PT    Recommendations for Other Services       Precautions / Restrictions Precautions Precautions: Fall Precaution Comments: Rt AFO at baseline Restrictions Weight Bearing Restrictions: No    Mobility  Bed Mobility               General bed mobility comments: Pt received in chair, left in chair    Transfers Overall transfer level:  Needs assistance Equipment used: 4-wheeled walker Transfers: Sit to/from Stand Sit to Stand: Supervision         General transfer comment: safe, well established technique; often needs help with return to sitting due to loss of leg strength and impaired balance  Ambulation/Gait Ambulation/Gait assistance: Min guard;Min assist Gait Distance (Feet): 25 Feet (27ft, then 43ft) Assistive device: 4-wheeled walker Gait Pattern/deviations: Step-to pattern     General Gait Details: cuesfor safe small steps; motor deficits as documented in previous note. 2nd AMB trial met with insideoutsock to forefoot of shoes to facilitate swing phase bilat by reducing friction. Progressive loss of tone and motor control in BLE as previously seen. Orthostatic as suspected. No other syncopal prodrome.   Stairs             Wheelchair Mobility    Modified Rankin (Stroke Patients Only)       Balance                                            Cognition Arousal/Alertness: Awake/alert Behavior During Therapy: WFL for tasks assessed/performed Overall Cognitive Status: Within Functional Limits for tasks assessed                                        Exercises Total Joint Exercises Long Arc Quad: AROM;Left;5 reps;Seated;Limitations Long [employer]  Quad Limitations: slow, aterixis appearing pattern, unable to generate force to achieve TKE    General Comments        Pertinent Vitals/Pain Pain Assessment: 0-10 Pain Score: 8  Pain Location: mid back at surgical site Pain Intervention(s): Limited activity within patient's tolerance;Monitored during session;Premedicated before session    Home Living                      Prior Function            PT Goals (current goals can now be found in the care plan section) Acute Rehab PT Goals Patient Stated Goal: return to PLOF PT Goal Formulation: With patient Time For Goal Achievement: 06/11/21 Potential to  Achieve Goals: Fair Progress towards PT goals: Not progressing toward goals - comment    Frequency    7X/week      PT Plan Discharge plan needs to be updated    Co-evaluation              AM-PAC PT "6 Clicks" Mobility   Outcome Measure  Help needed turning from your back to your side while in a flat bed without using bedrails?: A Little Help needed moving from lying on your back to sitting on the side of a flat bed without using bedrails?: A Little Help needed moving to and from a bed to a chair (including a wheelchair)?: A Little Help needed standing up from a chair using your arms (e.g., wheelchair or bedside chair)?: A Little Help needed to walk in hospital room?: A Lot Help needed climbing 3-5 steps with a railing? : Total 6 Click Score: 15    End of Session Equipment Utilized During Treatment: Gait belt (chair follow) Activity Tolerance: Patient limited by fatigue;Patient limited by pain;Treatment limited secondary to medical complications (Comment) Patient left: in chair;with family/visitor present;with call bell/phone within reach Nurse Communication: Mobility status PT Visit Diagnosis: Unsteadiness on feet (R26.81);Other abnormalities of gait and mobility (R26.89);Muscle weakness (generalized) (M62.81);Difficulty in walking, not elsewhere classified (R26.2);Other symptoms and signs involving the nervous system (R29.898)     Time: 1696-7893 PT Time Calculation (min) (ACUTE ONLY): 50 min  Charges:  $Gait Training: 38-52 mins                    11:07 AM, 05/30/21 Etta Grandchild, PT, DPT Physical Therapist - [facility]  [phone] ([place])     [place] 05/30/2021, 10:59 AM

## 2021-05-30 NOTE — Progress Notes (Signed)
Inpatient Rehabilitation Admissions Coordinator   Insurance has denied CIR approval for admit. I contacted patient by phone and she is aware and requests SNF in [place]. I discussed case with Dr. Naaman Plummer her physiatrist. I have alerted acute team and TOC. We will sign off at this time.  Danne Baxter, RN, MSN Rehab Admissions Coordinator [phone] 05/30/2021 10:07 AM

## 2021-05-30 NOTE — TOC Progression Note (Signed)
Transition of Care [facility]) - Progression Note    Patient Details  Name: [name] MRN: [id number] Date of Birth: [birth year]  [facility]) CM/SW [place], RN Phone Number: 05/30/2021, 1:47 PM  Clinical Narrative:    Reached out to [facility] asking them to review for a bed offer, left a VM and am awaiting a call back         Expected Discharge Plan and Services           Expected Discharge Date: 05/27/21                                     Social Determinants of Health (SDOH) Interventions    Readmission Risk Interventions No flowsheet data found.

## 2021-05-30 NOTE — TOC Progression Note (Addendum)
Transition of Care [facility]) - Progression Note    Patient Details  Name: [name] MRN: [id number] Date of Birth: 08-11-1961  Transition of Care [facility]) CM/SW Contact  Su Hilt, RN Phone Number: 05/30/2021, 4:17 PM  Clinical Narrative:     Reviewed bed offers with the patient, she would still ike a bed at [facility], I called [facility] place again and requested them to take a look for a bed offer, I spoke with Norvel Richards and the patient is fully covid vaccinated with 2 boosters, Norvel Richards will review the patient and call me with a bed offer if able to accept  [facility] called back and stated they do not have a bed available until Monday I notified the patient, she has reviewed the rating of the other bed offers and stated she will not be going to one she has decided that she will DC home, she wants to be set up with [facility],  She has a Cane, a rollator a rolling walker and a bedside commode at home, also has a back brace, she has help at home with her spouse, she has transportation. No additonal DME needed, I reached out to [facility] and they [facility]      Expected Discharge Plan and Services           Expected Discharge Date: 05/27/21                                     Social Determinants of Health (SDOH) Interventions    Readmission Risk Interventions No flowsheet data found.

## 2021-05-30 NOTE — NC FL2 (Signed)
[place] LEVEL OF CARE SCREENING TOOL     IDENTIFICATION  Patient Name: [name] Birthdate: 10/16/1961 Sex: female Admission Date (Current Location): 05/27/2021  [place] and [place] Number:  Engineering geologist and Address:  [facility], [address] [place], [place] [address]      Provider Number: [id number]  Attending Physician Name and Address:  Meade Maw, MD  Relative Name and Phone Number:  Mortimer Fries [phone]    Current Level of Care: Hospital Recommended Level of Care: [place] Prior Approval Number:    Date Approved/Denied:   PASRR Number: [id number]  Discharge Plan: SNF    Current Diagnoses: Patient Active Problem List   Diagnosis Date Noted   Uncontrolled pain 05/27/2021   Syncope 05/13/2021   Depression with anxiety 05/13/2021   Chronic diastolic CHF (congestive heart failure) ([place]) 05/13/2021   Chronic back pain 05/13/2021   Chest tightness 05/13/2021   Lower abdominal pain 05/13/2021   Syncope and collapse 01/01/2021   Abnormal echocardiogram 01/01/2021   BPPV (benign paroxysmal positional vertigo), left 12/26/2020   Odynophagia    Dysphagia 12/15/2020   Postural dizziness with presyncope 12/14/2020   Lumbar post-laminectomy syndrome    Chronic pain 09/28/2019   Lumbar spinal stenosis    Radicular pain    Gait disorder    Difficulty in urination 07/13/2018   Back pain 07/12/2018   Hyperlipidemia 04/16/2018   GAD (generalized anxiety disorder) 04/16/2018   AKI (acute kidney injury) ([place])    Benign essential HTN    Major depression, recurrent (HCC)    Lumbar radiculopathy 03/09/2018   Paraparesis ([place]) 03/09/2018   Essential hypertension    Chronic nonintractable headache    Leukocytosis    Acute blood loss anemia    Postoperative pain    Generalized weakness    Spinal stenosis at L4-L5 level 03/04/2018   Spinal stenosis of lumbar region 12/28/2015   Chest  pain 04/16/2015    Orientation RESPIRATION BLADDER Height & Weight     Self, Time, Situation  Normal Continent Weight: 76.7 kg Height:  '5\' 5"'$  (165.1 cm)  BEHAVIORAL SYMPTOMS/MOOD NEUROLOGICAL BOWEL NUTRITION STATUS      Continent Diet (Heart Healthy)  AMBULATORY STATUS COMMUNICATION OF NEEDS Skin   Extensive Assist Verbally Normal                       Personal Care Assistance Level of Assistance  Bathing, Dressing, Feeding Bathing Assistance: Limited assistance Feeding assistance: Independent Dressing Assistance: Limited assistance     Functional Limitations Info             SPECIAL CARE FACTORS FREQUENCY  PT (By licensed PT), OT (By licensed OT)     PT Frequency: 5 times per week OT Frequency: 3 times per week            Contractures Contractures Info: Not present    Additional Factors Info  Code Status, Allergies Code Status Info: FUll code Allergies Info: Cephalosporins, Penicillins, Anesthetics, Amide, Betadine Povidone Iodine, Latex, Peach Prunus Persica, Claritin           Current Medications (05/30/2021):  This is the current hospital active medication list Current Facility-Administered Medications  Medication Dose Route Frequency Provider Last Rate Last Admin   0.9 %  sodium chloride infusion  250 mL Intravenous Continuous Loleta Dicker, PA   Stopped at 05/28/21 1432   ALPRAZolam (XANAX) tablet 0.25-0.5 mg  0.25-0.5 mg Oral QHS  PRN Loleta Dicker, PA       atorvastatin (LIPITOR) tablet 20 mg  20 mg Oral QHS Loleta Dicker, [place]   20 mg at 05/29/21 2209   baclofen (LIORESAL) tablet 5 mg  5 mg Oral TID PRN Loleta Dicker, PA   5 mg at 05/29/21 [id number]   bisacodyl (DULCOLAX) suppository 10 mg  10 mg Rectal Daily PRN Loleta Dicker, PA       carvedilol (COREG) tablet 25 mg  25 mg Oral BID WC Loleta Dicker, [place]   25 mg at 05/30/21 [id number]   cholecalciferol (VITAMIN D3) tablet 1,000 Units  1,000 Units Oral Daily Loleta Dicker, PA    1,000 Units at 05/29/21 [id number]   diazepam (VALIUM) tablet 5 mg  5 mg Oral Q8H PRN Deetta Perla, MD   5 mg at 05/28/21 0919   diphenhydrAMINE (BENADRYL) capsule 25 mg  25 mg Oral Q8H PRN Deetta Perla, MD       DULoxetine (CYMBALTA) DR capsule 60 mg  60 mg Oral Daily Loleta Dicker, [place]   60 mg at 05/29/21 0954   enoxaparin (LOVENOX) injection 40 mg  40 mg Subcutaneous Q24H Loleta Dicker, [place]   40 mg at 05/29/21 [id number]   gabapentin (NEURONTIN) capsule 400 mg  400 mg Oral TID Loleta Dicker, PA   400 mg at 05/30/21 0755   irbesartan (AVAPRO) tablet 150 mg  150 mg Oral Daily Deetta Perla, MD   150 mg at 05/29/21 [id number]   And   hydrochlorothiazide (MICROZIDE) capsule 12.5 mg  12.5 mg Oral Daily Deetta Perla, MD   12.5 mg at 05/29/21 [id number]   meclizine (ANTIVERT) tablet 25 mg  25 mg Oral BID PRN Loleta Dicker, PA       menthol-cetylpyridinium (CEPACOL) lozenge 3 mg  1 lozenge Oral PRN Loleta Dicker, PA       Or   phenol (CHLORASEPTIC) mouth spray 1 spray  1 spray Mouth/Throat PRN Loleta Dicker, PA       ondansetron [facility]) tablet 4 mg  4 mg Oral Q6H PRN Loleta Dicker, PA       Or   ondansetron [facility]) injection 4 mg  4 mg Intravenous Q6H PRN Loleta Dicker, PA       oxyCODONE (Oxy IR/ROXICODONE) immediate release tablet 10 mg  10 mg Oral Q4H PRN Loleta Dicker, PA   10 mg at 05/30/21 [id number]   oxyCODONE (Oxy IR/ROXICODONE) immediate release tablet 5 mg  5 mg Oral Q4H PRN Loleta Dicker, PA       polyethylene glycol (MIRALAX / GLYCOLAX) packet 17 g  17 g Oral Daily PRN Loleta Dicker, PA   17 g at 05/30/21 0755   senna (SENOKOT) tablet 8.6 mg  1 tablet Oral BID Loleta Dicker, PA   8.6 mg at 05/29/21 2209   sodium chloride flush (NS) 0.9 % injection 3 mL  3 mL Intravenous Q12H Loleta Dicker, PA   3 mL at 05/29/21 2302   sodium chloride flush (NS) 0.9 % injection 3 mL  3 mL Intravenous PRN Loleta Dicker, PA       sodium phosphate (FLEET) 7-19 GM/118ML  enema 1 enema  1 enema Rectal Once PRN Loleta Dicker, PA         Discharge Medications: Please see discharge summary for a list of discharge medications.  Relevant Imaging Results:  Relevant Lab Results:   Additional  Information SS# [id number]  Su Hilt, RN

## 2021-05-31 ENCOUNTER — Ambulatory Visit: Payer: PPO | Admitting: Adult Health

## 2021-05-31 ENCOUNTER — Other Ambulatory Visit (HOSPITAL_COMMUNITY): Payer: Self-pay

## 2021-05-31 MED ORDER — SENNOSIDES-DOCUSATE SODIUM 8.6-50 MG PO TABS
1.0000 | ORAL_TABLET | Freq: Two times a day (BID) | ORAL | Status: DC
  Administered 2021-05-31: 1 via ORAL
  Filled 2021-05-31: qty 1

## 2021-05-31 MED ORDER — OXYCODONE HCL 5 MG PO TABS: 5.0000 mg | ORAL_TABLET | Freq: Four times a day (QID) | ORAL | Status: DC | PRN

## 2021-05-31 MED ORDER — OXYCODONE HCL 5 MG PO TABS
10.0000 mg | ORAL_TABLET | Freq: Four times a day (QID) | ORAL | Status: DC | PRN
  Administered 2021-05-31: 10 mg via ORAL
  Filled 2021-05-31: qty 2

## 2021-05-31 MED ORDER — SENNOSIDES-DOCUSATE SODIUM 8.6-50 MG PO TABS
1.0000 | ORAL_TABLET | Freq: Two times a day (BID) | ORAL | 0 refills | Status: DC
  Filled 2021-05-31: qty 60, 30d supply, fill #0

## 2021-05-31 MED ORDER — OXYCODONE-ACETAMINOPHEN 7.5-325 MG PO TABS
1.0000 | ORAL_TABLET | Freq: Four times a day (QID) | ORAL | 0 refills | Status: AC | PRN
  Filled 2021-05-31: qty 20, 5d supply, fill #0

## 2021-05-31 NOTE — TOC Progression Note (Signed)
Transition of Care [facility]) - Progression Note    Patient Details  Name: [name] MRN: [id number] Date of Birth: Mar 02, 1961  Transition of Care [facility]) CM/SW Contact  Su Hilt, RN Phone Number: 05/31/2021, 2:31 PM  Clinical Narrative:     Called EMS to trasport to home prior approved by HTA/THN Auth Number [id number] , they will come to get her there are 5 ahead of her      Expected Discharge Plan and Services           Expected Discharge Date: 05/31/21                                     Social Determinants of Health (SDOH) Interventions    Readmission Risk Interventions No flowsheet data found.

## 2021-05-31 NOTE — Plan of Care (Signed)
No acute events during the night. VSS. PRN roxicodone administered x 2 for pain. Pending placement.  Problem: Education: Goal: Knowledge of General Education information will improve Description: Including pain rating scale, medication(s)/side effects and non-pharmacologic comfort measures Outcome: Progressing   Problem: Health Behavior/Discharge Planning: Goal: Ability to manage health-related needs will improve Outcome: Progressing   Problem: Clinical Measurements: Goal: Ability to maintain clinical measurements within normal limits will improve Outcome: Progressing Goal: Will remain free from infection Outcome: Progressing Goal: Respiratory complications will improve Outcome: Progressing   Problem: Activity: Goal: Risk for activity intolerance will decrease Outcome: Progressing   Problem: Nutrition: Goal: Adequate nutrition will be maintained Outcome: Progressing   Problem: Coping: Goal: Level of anxiety will decrease Outcome: Progressing   Problem: Elimination: Goal: Will not experience complications related to bowel motility Outcome: Progressing Goal: Will not experience complications related to urinary retention Outcome: Progressing   Problem: Pain Managment: Goal: General experience of comfort will improve Outcome: Progressing   Problem: Safety: Goal: Ability to remain free from injury will improve Outcome: Progressing   Problem: Activity: Goal: Ability to avoid complications of mobility impairment will improve Outcome: Progressing Goal: Ability to tolerate increased activity will improve Outcome: Progressing Goal: Will remain free from falls Outcome: Progressing   Problem: Bowel/Gastric: Goal: Gastrointestinal status for postoperative course will improve Outcome: Progressing   Problem: Clinical Measurements: Goal: Ability to maintain clinical measurements within normal limits will improve Outcome: Progressing Goal: Postoperative complications will be  avoided or minimized Outcome: Progressing

## 2021-05-31 NOTE — Progress Notes (Signed)
Physical Therapy Treatment Patient Details Name: [name] MRN: [id number] DOB: 1960/10/29 Today's Date: 05/31/2021    History of Present Illness [name] is a 67yoF who comes [facility] on 05/27/21 for elective neurosurgery with Dr. Deetta Perla. Pt underwent removal of failed thoracic SCS 8/1. PMH: thoracic SCS, lumbar decompression c subsequent RLE motor impairment, HLD, HTN, CP, PONV, Rt foot drop, syncope (Feb 2022). Pt required postop admission due to pain control issues and new subjective weakness of lower extremity. MRI of Lx adn Tx spines performed on 05/28/21. At baseline pt is a limited community AMB with Rt AFO and 4WW, still drives, grocery shops, was working with aquatic therapy prior to surgery.    PT Comments    Pt in bed upon entry, agreeable to session. Started with full set of orthostatic vitals, pt again unable to tolerate safe standing for 3 minutes due to presyncope. Prodrome includes progressive weakness and imbalance in BLE, same as prior 3 days, but today with dizziness. Of note, prior admission [facility] in Feb, PT notes show consistent intolerance to upright activity marked by dizziness, vertigo. Pt then assisted to [facility]. Pt remarks with surprise that since admission pt has had continued difficulty with bladder emptying and increased bearing down for urination. Pt also still has not had a BM, RN plans on addressing later this morning. Pt educated on her orthostatic presentation last several days and similar intolerance to upright in February, educated on safe parameters for mobility in the context of ongoing orthostatic hypotension. Surgical PA and RN made aware. Pt has elected to return to home despite ongoing acute LLE weakness. Chief Strategy Officer discussed safe transition into house- given her persistent orthostasis and 5 stairs to enter, Pryor Curia has advocated for EMS transport back to home, pt is agreeable.    Follow Up Recommendations  Follow surgeon's recommendation for DC  plan and follow-up therapies;Supervision for mobility/OOB;SNF (Pt has elected to return to home as she is not agreeable to STR bed offers at present)     Equipment Recommendations  None recommended by PT    Recommendations for Other Services       Precautions / Restrictions Precautions Precautions: Fall Precaution Comments: Rt AFO at baseline Restrictions Weight Bearing Restrictions: No    Mobility  Bed Mobility Overal bed mobility: Needs Assistance Bed Mobility: Supine to Sit     Supine to sit: Supervision     General bed mobility comments: heavy effort, but no assist needed    Transfers Overall transfer level: Needs assistance Equipment used: 4-wheeled walker;None Transfers: Sit to/from [facility] Sit to Stand: Supervision Stand pivot transfers: Min guard       General transfer comment: unable to remain standing for 3 minute OH BP, progressive jello knees and dizziness today  Ambulation/Gait                 Stairs             Wheelchair Mobility    Modified Rankin (Stroke Patients Only)       Balance                                            Cognition Arousal/Alertness: Awake/alert Behavior During Therapy: WFL for tasks assessed/performed Overall Cognitive Status: Within Functional Limits for tasks assessed  Exercises      General Comments        Pertinent Vitals/Pain Pain Assessment: Faces Faces Pain Scale: Hurts little more Pain Location: mid back at surgical site reports "stiches" Pain Descriptors / Indicators: Operative site guarding Pain Intervention(s): Limited activity within patient's tolerance;Monitored during session;Premedicated before session;Repositioned    Home Living                      Prior Function            PT Goals (current goals can now be found in the care plan section) Acute Rehab PT Goals Patient  Stated Goal: get stronger PT Goal Formulation: With patient Time For Goal Achievement: 06/11/21 Potential to Achieve Goals: Fair Progress towards PT goals: Not progressing toward goals - comment    Frequency    7X/week      PT Plan Current plan remains appropriate    Co-evaluation              AM-PAC PT "6 Clicks" Mobility   Outcome Measure  Help needed turning from your back to your side while in a flat bed without using bedrails?: A Little Help needed moving from lying on your back to sitting on the side of a flat bed without using bedrails?: A Little Help needed moving to and from a bed to a chair (including a wheelchair)?: A Little Help needed standing up from a chair using your arms (e.g., wheelchair or bedside chair)?: A Lot Help needed to walk in hospital room?: A Lot Help needed climbing 3-5 steps with a railing? : Total 6 Click Score: 14    End of Session Equipment Utilized During Treatment: Gait belt Activity Tolerance: Treatment limited secondary to medical complications (Comment);Patient tolerated treatment well (orthostatic hypotension with presyncope) Patient left: in chair;with family/visitor present;with call bell/phone within reach Nurse Communication: Mobility status PT Visit Diagnosis: Unsteadiness on feet (R26.81);Other abnormalities of gait and mobility (R26.89);Muscle weakness (generalized) (M62.81);Difficulty in walking, not elsewhere classified (R26.2);Other symptoms and signs involving the nervous system (R29.898)     Time: [id number] PT Time Calculation (min) (ACUTE ONLY): 25 min  Charges:  $Therapeutic Activity: 8-22 mins $Self Care/Home Management: 8-22                    11:09 AM, 05/31/21 Etta Grandchild, PT, DPT Physical Therapist - [facility]  [phone] ([place])    [place] C 05/31/2021, 11:00 AM

## 2021-05-31 NOTE — Progress Notes (Signed)
Progress Note  History: [name] is POD#4 from uncomplicated removal of SCS.   POD4: She continues to improve. Despite continued discomfort as expected.  No new neurologic issues.   Physical Exam: Vitals:   05/31/21 0154 05/31/21 0425  BP: 111/66 119/65  Pulse: 67 78  Resp: 17 17  Temp: 97.8 F (36.6 C) 97.9 F (36.6 C)  SpO2:  100%    AA Ox3 sitting up in bed CNI Strength:5/5 throughout BUE.  4- right HF, KE, DF 4 EHL and PF 3/5 throughout LLE except 1/5 DF and EHL. 4-/5 PF  Intact sensation to light touch BLE.  Incisions: Removed dressings. Incisions c/d/I. Left flank incision c/d/I with dermabond in place.   ABD: none tender without distention.   Data:  Recent Labs  Lab 05/28/21 0715 05/29/21 0857  NA 137  --   K 3.9  --   CL 102  --   CO2 28  --   BUN 13  --   CREATININE 0.99 1.06*  GLUCOSE 125*  --   CALCIUM 8.8*  --     No results for input(s): AST, ALT, ALKPHOS in the last 168 hours.  Invalid input(s): TBILI   Recent Labs  Lab 05/29/21 0857  WBC 12.8*  HGB 11.1*  HCT 33.7*  PLT 242    No results for input(s): APTT, INR in the last 168 hours.       Other tests/results: MRI T and L spine without compressive pathology  Assessment/Plan:  [name] is a 60 y.o female POD#4 from removal of SCS.   - mobilize; PT consulted.  - pain control. PO only  - DVT prophylaxis: Continues SCDs.  - Benadryl PRN for itching  - continue bowel regimen  - dispo planning: home with home health services  Cooper Render  Department of Neurosurgery

## 2021-05-31 NOTE — Progress Notes (Signed)
VSS, IV cath removed site was c/d/I. Pt transport via [facility] due to orthostatic changes and buckling of the knees.. Scripts are escripted to pharmacy. Pt husband is going to pick up personal items and walker due to ems will not be able to carry them in back.

## 2021-05-31 NOTE — Discharge Summary (Signed)
Physician Discharge Summary  Patient ID: [name] MRN: [id number] DOB/AGE: 04-20-1961 60 y.o.  Admit date: 05/27/2021 Discharge date: 05/31/2021  Admission Diagnoses:  Discharge Diagnoses:  Active Problems:   Uncontrolled pain   Discharged Condition: good  Hospital Course: [name] is a 60 y.o who underwent an uncomplicated removal of SCS on 05/27/21. She experienced increased pain and generalized weakness post-operatively and was admitted for further treatment. MRIs were completed of her thoracic and lumbar spine which did not show any concerning pathology. She was see by therapy and found to be appropriate for discharge home with home health services.  During the initial portion of her hospital course was experienced some minor drainage from her midline and right flank incisions. These were covered with dressings and the drainage stopped. Her incisions were left open to air at discharge with plans to remove her midline incision sutures around POD#10-14.  Consults:  PT and OT  Significant Diagnostic Studies: radiology: MRI: Thoracic and lumbar spine MRI without concerning pathology    IMPRESSION: 1. Moderate degenerative thoracic spondylosis with multilevel disc disease and facet disease. This appears slightly progressive since the prior study from 2019. 2. Mild cord thinning and suspected areas of T2 cord signal abnormality likely reflecting myelomalacia, as detailed above. 3. Stable postoperative changes at L4-5 with wide decompressive laminectomy. No significant spinal stenosis. Stable diffuse bulging annulus with mild flattening of the ventral thecal sac and mild foraminal encroachment bilaterally. 4. Stable mild spinal and mild to moderate bilateral lateral recess stenosis at L3-4. 5. Stable mild spinal and bilateral lateral recess stenosis at L2-3.  Electronically Signed   By: Marijo Sanes M.D.   On: 05/28/2021 11:13   Treatments: IV hydration and  analgesia: IV and PO narcotics as needed  Discharge Exam: Blood pressure 119/65, pulse 78, temperature 97.9 F (36.6 C), resp. rate 17, height '5\' 5"'$  (1.651 m), weight 76.7 kg, SpO2 100 %. AA Ox3 sitting up in bed CNI Strength:5/5 throughout BUE. 4- right HF, KE, DF 4 EHL and PF 3/5 throughout LLE except 1/5 DF and EHL. 4-/5 PF Intact sensation to light touch BLE. Incisions: Removed dressings. Incisions c/d/I. Left flank incision c/d/I with dermabond in place.   ABD: none tender without distention.  Disposition: Discharge disposition: 01-Home or Self Care      Discharge Instructions     Diet - low sodium heart healthy   Complete by: As directed    Incentive spirometry RT   Complete by: As directed    Increase activity slowly   Complete by: As directed       Allergies as of 05/31/2021       Reactions   Cephalosporins Anaphylaxis   Penicillins Anaphylaxis, Hives   Did it involve swelling of the face/tongue/throat, SOB, or low BP? Yes Did it involve sudden or severe rash/hives, skin peeling, or any reaction on the inside of your mouth or nose? Yes Did you need to seek medical attention at a hospital or doctor's office? Yes When did it last happen?      10+ years If all above answers are "NO", may proceed with cephalosporin use.   Anesthetics, Amide Nausea And Vomiting   Does not know name of it. States they put it on record foot center.    Betadine [povidone Iodine] Other (See Comments)   "Skin Burn" caused scar on Left buttock   Latex Hives, Itching, Rash   Peach [prunus Persica] Hives   Claritin [loratadine]    Rash, pruritis  Medication List     TAKE these medications    ALPRAZolam 0.5 MG tablet Commonly known as: XANAX Take 1/2 to 1 tablet (0.25-0.5 mg total) by mouth at bedtime as needed for anxiety.   atorvastatin 20 MG tablet Commonly known as: LIPITOR TAKE 1 TABLET BY MOUTH ONCE DAILY   baclofen 10 MG tablet Commonly known as: LIORESAL TAKE  1 TABLET BY MOUTH THREE TIMES DAILY AS NEEDED   carvedilol 25 MG tablet Commonly known as: COREG Take 1 tablet (25 mg total) by mouth 2 (two) times daily with a meal.   cholecalciferol 25 MCG (1000 UNIT) tablet Commonly known as: VITAMIN D3 Take 1,000 Units by mouth daily.   diphenhydrAMINE 25 MG tablet Commonly known as: BENADRYL Take 25 mg by mouth daily as needed for itching.   DULoxetine 60 MG capsule Commonly known as: Cymbalta Take 1 capsule (60 mg total) by mouth daily.   Femring 0.05 MG/24HR Ring Generic drug: Estradiol Acetate USE ONE RING VAGINALLY EVERY 3 MONTHS AS DIRECTED   gabapentin 400 MG capsule Commonly known as: NEURONTIN TAKE 1 CAPSULE BY MOUTH 3 TIMES DAILY   meclizine 25 MG tablet Commonly known as: ANTIVERT TAKE 1 TABLET BY MOUTH 2 TIMES DAILY AS NEEDED FOR DIZZINESS   oxyCODONE-acetaminophen 7.5-325 MG tablet Commonly known as: Percocet Take 1 tablet by mouth every 6 (six) hours as needed for up to 5 days for severe pain.   Pfizer-BioNT COVID-19 Vac-TriS Susp injection Generic drug: COVID-19 mRNA Vac-TriS (Pfizer)   senna-docusate 8.6-50 MG tablet Commonly known as: Senokot-S Take 1 tablet by mouth 2 (two) times daily.   telmisartan-hydrochlorothiazide 40-12.5 MG tablet Commonly known as: Micardis HCT Take 1 tablet by mouth daily.   traMADol 50 MG tablet Commonly known as: Ultram Take 1 tablet (50 mg total) by mouth every 6 (six) hours as needed for up to 5 days. What changed:  how much to take how to take this when to take this reasons to take this        Follow-up Information     Loleta Dicker, PA Follow up on 06/11/2021.   Why: for suture removal. Please follow your previously scheduled appointment. Contact information: [name] [address] [phone]                 Signed: Loleta Dicker 05/31/2021, 7:22 AM

## 2021-05-31 NOTE — TOC Progression Note (Signed)
Transition of Care [facility]) - Progression Note    Patient Details  Name: [name] MRN: [id number] Date of Birth: 02/14/1961  Transition of [facility]) CM/SW Contact  Su Hilt, RN Phone Number: 05/31/2021, 10:55 AM  Clinical Narrative:     Reached out to HTA/THN and requested EMS approval for [facility] to transport home due to Orthostatic Hypotension and legs buckling when standing, she has 5 stairs to get up to get home, awaiting a response with approval        Expected Discharge Plan and Services           Expected Discharge Date: 05/31/21                                     Social Determinants of Health (SDOH) Interventions    Readmission Risk Interventions No flowsheet data found.

## 2021-06-03 DIAGNOSIS — G894 Chronic pain syndrome: Secondary | ICD-10-CM | POA: Diagnosis not present

## 2021-06-03 DIAGNOSIS — R918 Other nonspecific abnormal finding of lung field: Secondary | ICD-10-CM | POA: Diagnosis not present

## 2021-06-03 DIAGNOSIS — M47814 Spondylosis without myelopathy or radiculopathy, thoracic region: Secondary | ICD-10-CM | POA: Diagnosis not present

## 2021-06-03 DIAGNOSIS — Z9682 Presence of neurostimulator: Secondary | ICD-10-CM | POA: Diagnosis not present

## 2021-06-03 DIAGNOSIS — K219 Gastro-esophageal reflux disease without esophagitis: Secondary | ICD-10-CM | POA: Diagnosis not present

## 2021-06-03 DIAGNOSIS — M5134 Other intervertebral disc degeneration, thoracic region: Secondary | ICD-10-CM | POA: Diagnosis not present

## 2021-06-03 DIAGNOSIS — Z4789 Encounter for other orthopedic aftercare: Secondary | ICD-10-CM | POA: Diagnosis not present

## 2021-06-03 DIAGNOSIS — I4891 Unspecified atrial fibrillation: Secondary | ICD-10-CM | POA: Diagnosis not present

## 2021-06-03 DIAGNOSIS — M48061 Spinal stenosis, lumbar region without neurogenic claudication: Secondary | ICD-10-CM | POA: Diagnosis not present

## 2021-06-03 DIAGNOSIS — I209 Angina pectoris, unspecified: Secondary | ICD-10-CM | POA: Diagnosis not present

## 2021-06-03 DIAGNOSIS — I051 Rheumatic mitral insufficiency: Secondary | ICD-10-CM | POA: Diagnosis not present

## 2021-06-03 DIAGNOSIS — Z9181 History of falling: Secondary | ICD-10-CM | POA: Diagnosis not present

## 2021-06-03 DIAGNOSIS — I119 Hypertensive heart disease without heart failure: Secondary | ICD-10-CM | POA: Diagnosis not present

## 2021-06-03 DIAGNOSIS — F419 Anxiety disorder, unspecified: Secondary | ICD-10-CM | POA: Diagnosis not present

## 2021-06-03 DIAGNOSIS — E785 Hyperlipidemia, unspecified: Secondary | ICD-10-CM | POA: Diagnosis not present

## 2021-06-03 DIAGNOSIS — I700 Atherosclerosis of aorta: Secondary | ICD-10-CM | POA: Diagnosis not present

## 2021-06-03 DIAGNOSIS — M21371 Foot drop, right foot: Secondary | ICD-10-CM | POA: Diagnosis not present

## 2021-06-04 ENCOUNTER — Other Ambulatory Visit (HOSPITAL_COMMUNITY): Payer: Self-pay

## 2021-06-04 ENCOUNTER — Other Ambulatory Visit: Payer: Self-pay | Admitting: Family Medicine

## 2021-06-04 DIAGNOSIS — E785 Hyperlipidemia, unspecified: Secondary | ICD-10-CM

## 2021-06-04 MED ORDER — ATORVASTATIN CALCIUM 20 MG PO TABS
ORAL_TABLET | Freq: Every day | ORAL | 2 refills | Status: DC
  Filled 2021-06-04: qty 90, 90d supply, fill #0
  Filled 2021-08-26: qty 90, 90d supply, fill #1
  Filled 2021-11-16: qty 90, 90d supply, fill #2

## 2021-06-07 ENCOUNTER — Telehealth: Payer: Self-pay | Admitting: Family Medicine

## 2021-06-07 NOTE — Chronic Care Management (AMB) (Signed)
  Chronic Care Management   Note  06/07/2021 Name: [name] MRN: [id number] DOB: 03-Jun-1961  [name] is a 60 y.o. year old female who is a primary care patient of [name], Malka So, MD. I reached out to [name] by phone today in response to a referral sent by [name]'s PCP, [name], [name], MD.   [name] was given information about Chronic Care Management services today including:  CCM service includes personalized support from designated clinical staff supervised by her physician, including individualized plan of care and coordination with other care providers 24/7 contact phone numbers for assistance for urgent and routine care needs. Service will only be billed when office clinical staff spend 20 minutes or more in a month to coordinate care. Only one practitioner may furnish and bill the service in a calendar month. The patient may stop CCM services at any time (effective at the end of the month) by phone call to the office staff.   Patient agreed to services and verbal consent obtained.   Follow up plan:   Tatjana Secretary/administrator

## 2021-06-10 ENCOUNTER — Other Ambulatory Visit (HOSPITAL_COMMUNITY): Payer: Self-pay

## 2021-06-10 ENCOUNTER — Telehealth: Payer: Self-pay | Admitting: Pharmacist

## 2021-06-10 NOTE — Chronic Care Management (AMB) (Signed)
Chronic Care Management Pharmacy Assistant   Name: [name]  MRN: [id number] DOB: 05-May-1961   Reason for Encounter: Chart review for Initial appointment with Jeni Salles Clinical Pharmacist on 06-11-2021 at 3:30 in office.   Conditions to be addressed/monitored: CHF, HTN, HLD, and Depression   Recent office visits:  05-17-2021 [name], [name], MD- Patient presented for Syncope and other concerns. No medication changes.  05-03-2021 [name], [name], MD- Patient presented for hypertension and other concerns. Changed XANAX 0.5 MG tablet take 1/2-1 tablet at bedtime PRN, Duloxetine 60 mg Oral Daily. Stopped Lorazepam   02-18-2021 [name], [name], MD- Patient presented for hypertension and other concerns. No medication changes.  12-28-2020 [name], [name], MD- Patient presented for Carpal tunnel syndrome of left wrist and other concerns. No medication changes.  12-14-2020 [name], [name], MD- Patient presented for chest pain and other concerns. No medication changes.  Recent consult visits:  05-01-2021 Meredith Staggers, MD (Physical Med) - Patient presented for Postoperative pain and other concerns. No medication changes.   04-16-2021 Pixie Casino, MD (Cardiology) - Patient presented for CT Cardiac. No medication changes  04-02-2021 Malen Gauze, MD (Neurosurgery) - Patient presented for low back pain and other concerns. No medication changes.  03-27-2021 Malen Gauze, MD (Cardiology) - Patient presented for Chest pain, unspecified type and other concerns. Prescribed Telmisartan-HCTZ 40-12.5 mg  02-07-2021  Pixie Casino, MD (Cardiology) - Patient presented for Hypertrophic cardiomyopathy and other concerns.  Changed Carvedilol 25 MG Oral twice a day with meals. Stopped Estradiol Acetate 0.05 MG/24HR RING /Omeprazole  40 MG capsule and Polyethylene Glycol packet.  01-15-2021 Malen Gauze (Neurosurgery) - No visit details available.  01-15-2021 Melina Schools (Orthopedic Surgery) - Patient presented for X-rays. No medication changes.  01-14-2021 Domenic Moras, PT - Patient presented for benign paroxysmal positional vertigo and other concerns. No Medication changes  01-07-2021 Domenic Moras, PT - Patient presented for benign paroxysmal positional vertigo. No Medication changes  01-03-2021 Danella Sensing L and Edi - Patient presented for PHYSICAL THERAPY EVALUATION LOW COMPLEX  and other concerns. No medication changes.  01-01-2021 Erlene Quan, PA-C (Cardiology) - Patent presented for Syncope and collapse and other concerns. No medication changes.  12-26-2020 Meredith Staggers, MD (Phys Med) - Patient presented for benign paroxysmal positional vertigo of the left side. No medication changes.   12-21-2020 Suella Broad (PT) - Patient presented for opioid screening and other concerns. No medication changes.  12-16-2020 Lucio Edward T MD (Endoscopy) - Patient presented for biopsy esophagus to rule out EOE. No medication changes.   Hospital visits:  Medication Reconciliation was completed by comparing discharge summary, patient's EMR and Pharmacy list, and upon discussion with patient.  Presented to [facility] on 05-27-2021 due to uncontrolled pain. Patient was present for 4 days.  New?Medications Started at [facility] Discharge:?? -started  Oxycodone- acetaminophen 7.5-325 PRN for 5 days [place] 8.6-5.0 twice daily  Medication Changes at Hospital Discharge: -Changed  Tramadol 50 mg every 6 hours PRN for 5 days  Medications Discontinued at Hospital Discharge: -Stopped None   Medications that remain the same after Hospital Discharge:??  -All other medications will remain the same.     Medication Reconciliation was completed by comparing discharge summary, patient's EMR and Pharmacy list, and upon discussion with patient.    Presented to [facility] on 05-13-2021 due to syncope. Patient was  present for 31 hours.  New?Medications Started at  Hospital Discharge:?? -started  None  Medication Changes at Hospital Discharge: -Changed  Atorvastatin 20 MG take once daily Baclofen 10 MG take one tablet 3 times daily PRN Femring 0.05 MG/24HR use one ring vaginally every 3 months Gabapentin 400 MG  take 3 times daily  Meclizine 25 MG take one tablet two times daily PRN Methocarbamol 500 take one at bedtime PRN  Medications Discontinued at Hospital Discharge: -Stopped None   Medications that remain the same after Hospital Discharge:??  -All other medications will remain the same.     Presented to [facility] on 12-14-2020 due to postural dizziness with presyncope. Patient was present for 6 days discharged on 12-20-2020  New?Medications Started at [facility] Discharge:?? -started  Meclizine 25 MG tablet one tablet twice daily PRN  Medication Changes at Hospital Discharge: -Changed  Carvedilol 12.5 MG one tablet twice daily with a meal  Medications Discontinued at Hospital Discharge: -Stopped None   Medications that remain the same after Hospital Discharge:??  -All other medications will remain the same.    Medications: Outpatient Encounter Medications as of 06/10/2021  Medication Sig   ALPRAZolam (XANAX) 0.5 MG tablet Take 1/2 to 1 tablet (0.25-0.5 mg total) by mouth at bedtime as needed for anxiety.   atorvastatin (LIPITOR) 20 MG tablet TAKE 1 TABLET BY MOUTH ONCE DAILY   baclofen (LIORESAL) 10 MG tablet TAKE 1 TABLET BY MOUTH THREE TIMES DAILY AS NEEDED   carvedilol (COREG) 25 MG tablet Take 1 tablet (25 mg total) by mouth 2 (two) times daily with a meal.   cholecalciferol (VITAMIN D3) 25 MCG (1000 UNIT) tablet Take 1,000 Units by mouth daily.   diphenhydrAMINE (BENADRYL) 25 MG tablet Take 25 mg by mouth daily as needed for itching.   DULoxetine (CYMBALTA) 60 MG capsule Take 1 capsule (60 mg total) by mouth daily.   Estradiol Acetate (FEMRING) 0.05  MG/24HR RING USE ONE RING VAGINALLY EVERY 3 MONTHS AS DIRECTED   gabapentin (NEURONTIN) 400 MG capsule TAKE 1 CAPSULE BY MOUTH 3 TIMES DAILY   meclizine (ANTIVERT) 25 MG tablet TAKE 1 TABLET BY MOUTH 2 TIMES DAILY AS NEEDED FOR DIZZINESS   PFIZER-BIONT COVID-19 VAC-TRIS SUSP injection    senna-docusate (SENOKOT-S) 8.6-50 MG tablet Take 1 tablet by mouth 2 (two) times daily.   telmisartan-hydrochlorothiazide (MICARDIS HCT) 40-12.5 MG tablet Take 1 tablet by mouth daily.   No facility-administered encounter medications on file as of 06/10/2021.  Have you seen any other providers since your last visit? Patient reports other than her surgeon no  Any changes in your medications or health? Patient reports after surgery the only change was the oxycodone that she is not currently taking due to constipation, has been using tramadol.  Any side effects from any medications? Patient reports none.   Do you have an symptoms or problems not managed by your medications? Patient reports none.  Any concerns about your health right now? Patient reports heart disease is hereditary she has lost several family members due to it her mom and brother. She states it is occasionally on her mind does not think specifically what happened to them will happen to her but it is a concern.   Has your provider asked that you check blood pressure, blood sugar, or follow special diet at home? Patient reports yes she was asked to monitor her blood pressures but does not have a cuff. She reports she has PT and they check it for her at home. She reports she currently does  not have the best appetite has been eating small meals, but does get in some meat and greens and drinks a lot of smoothies.   Do you get any type of exercise on a regular basis? She is currently in PT  Can you think of a goal you would like to reach for your health? Patient reports none.   Do you have any problems getting your medications? Pt reports no she has her  med's delivered and her pharmacy benefit is great.   Is there anything that you would like to discuss during the appointment? Patient reports no   She is aware to bring medications and supplements to appointment, she noted that she spoke to someone regarding the need to do so since she is not getting around well at the moment, was told she did not have to bring as her list is accurate and current. She has confirmed date and type of appointment.  Fill History  : ALPRAZolam (XANAX) 0.5 MG tablet 05/03/2021 30   atorvastatin (LIPITOR) 20 MG tablet 06/04/2021 90   BACLOFEN 10 MG TABS 01/17/2021 30   carvedilol (COREG) 25 MG tablet 02/08/2021 90   DULoxetine (CYMBALTA) 60 MG capsule 05/03/2021 90   Estradiol Acetate (FEMRING) 0.05 MG/24HR RING 02/25/2021 90   gabapentin (NEURONTIN) 400 MG capsule 05/15/2021 30   MECLIZINE HCL 25 MG TABS 12/20/2020 10   telmisartan-hydrochlorothiazide (MICARDIS HCT) 40-12.5 MG tablet 03/27/2021 90   oxyCODONE-acetaminophen (PERCOCET) 7.5-325 MG tablet 05/31/2021 5   traMADol (ULTRAM) 50 MG tablet 05/27/2021 6   Care Gaps: AWV- MSG sent to Ramond Craver CMA to schedule. COVID Vaccines - Overdue Pneumococcal Vaccine - Overdue Zoster Vaccine - Overdue Flu Vaccine - Overdue  Star Rating Drugs: Telmisartan -HCTZ 40-12.5 mg - Last filled 03-27-2021 90 DS at [facility] Atorvastatin (Lipitor) 20 mg - Last filled 06-04-2021 90 DS at [place] Pharmacist Assistant [phone]

## 2021-06-11 ENCOUNTER — Ambulatory Visit (INDEPENDENT_AMBULATORY_CARE_PROVIDER_SITE_OTHER): Payer: PPO | Admitting: Pharmacist

## 2021-06-11 ENCOUNTER — Other Ambulatory Visit (HOSPITAL_COMMUNITY): Payer: Self-pay

## 2021-06-11 ENCOUNTER — Other Ambulatory Visit: Payer: Self-pay

## 2021-06-11 DIAGNOSIS — I10 Essential (primary) hypertension: Secondary | ICD-10-CM | POA: Diagnosis not present

## 2021-06-11 DIAGNOSIS — F419 Anxiety disorder, unspecified: Secondary | ICD-10-CM

## 2021-06-11 NOTE — Progress Notes (Signed)
Chronic Care Management Pharmacy Note  06/22/2021 Name:  [name] MRN:  [id number] DOB:  [birth year]  Summary: LDL not at goal < 100 Anxiety is not ideally controlled  Recommendations/Changes made from today's visit: -Recommended for patient to restart taking duloxetine daily as prescribed as she thought it was PRN -Recommend repeat lipid panel and consider increasing statin intensity if LDL is not < 100 -Recommended routine BP monitoring at home  Plan: Follow up BP assessment in 2 months   Subjective: [name] is an 60 y.o. year old female who is a primary patient of [name], Malka So, MD.  The CCM team was consulted for assistance with disease management and care coordination needs.    Engaged with patient face to face for initial visit in response to provider referral for pharmacy case management and/or care coordination services.   Consent to Services:  The patient was given the following information about Chronic Care Management services today, agreed to services, and gave verbal consent: 1. CCM service includes personalized support from designated clinical staff supervised by the primary care provider, including individualized plan of care and coordination with other care providers 2. 24/7 contact phone numbers for assistance for urgent and routine care needs. 3. Service will only be billed when office clinical staff spend 20 minutes or more in a month to coordinate care. 4. Only one practitioner may furnish and bill the service in a calendar month. 5.The patient may stop CCM services at any time (effective at the end of the month) by phone call to the office staff. 6. The patient will be responsible for cost sharing (co-pay) of up to 20% of the service fee (after annual deductible is met). Patient agreed to services and consent obtained.  Patient Care Team: [name], [name], MD as PCP - General (Family Medicine) Debara Pickett Nadean Corwin, MD as PCP - General  (Cardiology) Debara Pickett Nadean Corwin, MD as PCP - Cardiology (Cardiology) [name], MD as Consulting Physician (Orthopedic Surgery) Viona Gilmore, [facility] as Pharmacist (Pharmacist)  Recent office visits: 05-17-2021 [name], [name], MD- Patient presented for Syncope and other concerns. No medication changes.   05-03-2021 [name], [name], MD- Patient presented for hypertension and other concerns. Changed XANAX 0.5 MG tablet take 1/2-1 tablet at bedtime PRN, Duloxetine 60 mg Oral Daily. Stopped Lorazepam    02-18-2021 [name], [name], MD- Patient presented for hypertension and other concerns. No medication changes.   12-28-2020 [name], [name], MD- Patient presented for Carpal tunnel syndrome of left wrist and other concerns. No medication changes.   12-14-2020 [name], [name], MD- Patient presented for chest pain and other concerns. No medication changes.  Recent consult visits: 05-01-2021 Meredith Staggers, MD (Physical Med) - Patient presented for Postoperative pain and other concerns. No medication changes.    04-16-2021 Pixie Casino, MD (Cardiology) - Patient presented for CT Cardiac. No medication changes   04-02-2021 Malen Gauze, MD (Neurosurgery) - Patient presented for low back pain and other concerns. No medication changes.   03-27-2021 Malen Gauze, MD (Cardiology) - Patient presented for Chest pain, unspecified type and other concerns. Prescribed Telmisartan-HCTZ 40-12.5 mg   02-07-2021  Pixie Casino, MD (Cardiology) - Patient presented for Hypertrophic cardiomyopathy and other concerns.  Changed Carvedilol 25 MG Oral twice a day with meals. Stopped Estradiol Acetate 0.05 MG/24HR RING /Omeprazole  40 MG capsule and Polyethylene Glycol packet.   01-15-2021 Malen Gauze (Neurosurgery) - No visit details available.  01-15-2021 Melina Schools (Orthopedic Surgery) - Patient presented for X-rays. No medication changes.   01-14-2021 Domenic Moras, PT - Patient presented  for benign paroxysmal positional vertigo and other concerns. No Medication changes   01-07-2021 Domenic Moras, PT - Patient presented for benign paroxysmal positional vertigo. No Medication changes   01-03-2021 Danella Sensing L and Edi - Patient presented for PHYSICAL THERAPY EVALUATION LOW COMPLEX  and other concerns. No medication changes.   01-01-2021 Erlene Quan, PA-C (Cardiology) - Patent presented for Syncope and collapse and other concerns. No medication changes.   12-26-2020 Meredith Staggers, MD (Phys Med) - Patient presented for benign paroxysmal positional vertigo of the left side. No medication changes.    12-21-2020 Suella Broad (PT) - Patient presented for opioid screening and other concerns. No medication changes.   12-16-2020 Lucio Edward T MD (Endoscopy) - Patient presented for biopsy esophagus to rule out EOE. No medication changes.  Hospital visits: Medication Reconciliation was completed by comparing discharge summary, patient's EMR and Pharmacy list, and upon discussion with patient.   Presented to [facility] on 05-27-2021 due to uncontrolled pain. Patient was present for 4 days.   New?Medications Started at [facility] Discharge:?? -started  Oxycodone- acetaminophen 7.5-325 PRN for 5 days Senakot 8.6-5.0 twice daily   Medication Changes at Hospital Discharge: -Changed  Tramadol 50 mg every 6 hours PRN for 5 days   Medications Discontinued at Hospital Discharge: -Stopped None    Medications that remain the same after Hospital Discharge:??  -All other medications will remain the same.       Medication Reconciliation was completed by comparing discharge summary, patient's EMR and Pharmacy list, and upon discussion with patient.       Presented to [facility] on 05-13-2021 due to syncope. Patient was present for 31 hours.   New?Medications Started at [facility] Discharge:?? -started  None   Medication Changes at  Hospital Discharge: -Changed  Atorvastatin 20 MG take once daily Baclofen 10 MG take one tablet 3 times daily PRN Femring 0.05 MG/24HR use one ring vaginally every 3 months Gabapentin 400 MG  take 3 times daily  Meclizine 25 MG take one tablet two times daily PRN Methocarbamol 500 take one at bedtime PRN   Medications Discontinued at Hospital Discharge: -Stopped None    Medications that remain the same after Hospital Discharge:??  -All other medications will remain the same.       Presented to [facility] on 12-14-2020 due to postural dizziness with presyncope. Patient was present for 6 days discharged on 12-20-2020   New?Medications Started at [facility] Discharge:?? -started  Meclizine 25 MG tablet one tablet twice daily PRN   Medication Changes at Hospital Discharge: -Changed  Carvedilol 12.5 MG one tablet twice daily with a meal   Medications Discontinued at Hospital Discharge: -Stopped None    Medications that remain the same after Hospital Discharge:??  -All other medications will remain the same.     Objective:  Lab Results  Component Value Date   CREATININE 1.06 (H) 05/29/2021   BUN 13 05/28/2021   GFR 56.92 (L) 12/14/2020   GFRNONAA >60 05/29/2021   GFRAA >60 09/23/2019   NA 137 05/28/2021   K 3.9 05/28/2021   CALCIUM 8.8 (L) 05/28/2021   CO2 28 05/28/2021   GLUCOSE 125 (H) 05/28/2021    Lab Results  Component Value Date/Time   HGBA1C 5.5 03/06/2018 03:26 AM   HGBA1C  06/01/2007 02:05 PM  5.4 (NOTE)   The ADA recommends the following therapeutic goals for glycemic   control related to Hgb A1C measurement:   Goal of Therapy:   < 7.0% Hgb A1C   Action Suggested:  > 8.0% Hgb A1C   Ref:  Diabetes Care, 22, Suppl. 1, 1999   GFR 56.92 (L) 12/14/2020 10:10 AM   GFR 61.59 03/28/2020 03:20 PM    Last diabetic Eye exam: No results found for: HMDIABEYEEXA  Last diabetic Foot exam: No results found for: HMDIABFOOTEX   Lab Results  Component  Value Date   CHOL 198 04/20/2020   HDL 61.70 04/20/2020   LDLCALC 123 (H) 04/20/2020   TRIG 62.0 04/20/2020   CHOLHDL 3 04/20/2020    Hepatic Function Latest Ref Rng & Units 05/13/2021 04/20/2020 07/21/2018  Total Protein 6.5 - 8.1 g/dL 6.9 6.6 5.6(L)  Albumin 3.5 - 5.0 g/dL 3.6 4.1 2.8(L)  AST 15 - 41 U/L _0 ALT 0 - 44 U/L 17 19 47(H)  Alk Phosphatase 38 - 126 U/L 52 70 50  Total Bilirubin 0.3 - 1.2 mg/dL 0.5 0.4 0.3  Bilirubin, Direct 0.0 - 0.3 mg/dL - 0.1 -    Lab Results  Component Value Date/Time   TSH 3.361 05/13/2021 06:42 AM   TSH 3.111 12/14/2020 09:05 PM   TSH 1.80 04/01/2019 03:35 PM   TSH 5.749 Test methodology is 3rd generation TSH(H) 05/31/2007 10:30 PM   FREET4 0.87 05/13/2021 06:42 AM   FREET4 1.02 12/03/2017 11:00 AM    CBC Latest Ref Rng & Units 05/29/2021 05/14/2021 05/13/2021  WBC 4.0 - 10.5 K/uL 12.8(H) 4.1 3.5(L)  Hemoglobin 12.0 - 15.0 g/dL 11.1(L) 10.8(L) 11.8(L)  Hematocrit 36.0 - 46.0 % 33.7(L) 32.0(L) 35.2(L)  Platelets 150 - 400 K/uL 242 241 257    Lab Results  Component Value Date/Time   VD25OH 29.5 (L) 12/03/2017 11:00 AM    Clinical ASCVD: No  The 10-year ASCVD risk score Mikey Bussing DC Jr., et al., 2013) is: 6.2%   Values used to calculate the score:     Age: 47 years     Sex: Female     Is Non-Hispanic African American: Yes     Diabetic: No     Tobacco smoker: No     Systolic Blood Pressure: 962 mmHg     Is BP treated: Yes     HDL Cholesterol: 61.7 mg/dL     Total Cholesterol: 198 mg/dL    Depression screen [facility] 2/9 04/22/2020 08/17/2018  Decreased Interest 2 0  Down, Depressed, Hopeless 0 0  PHQ - 2 Score 2 0  Altered sleeping 2 -  Tired, decreased energy 2 -  Change in appetite 1 -  Feeling bad or failure about yourself  0 -  Trouble concentrating 2 -  Moving slowly or fidgety/restless 1 -  Suicidal thoughts 0 -  PHQ-9 Score 10 -  Difficult doing work/chores Very difficult -  Some recent data might be hidden      Social  History   Tobacco Use  Smoking Status Never  Smokeless Tobacco Never   BP Readings from Last 3 Encounters:  05/31/21 (!) 117/59  05/17/21 130/80  05/14/21 (!) 144/96   Pulse Readings from Last 3 Encounters:  05/31/21 68  05/17/21 68  05/14/21 67   Wt Readings from Last 3 Encounters:  05/27/21 169 lb (76.7 kg)  05/13/21 169 lb (76.7 kg)  05/03/21 169 lb (76.7 kg)   BMI Readings from Last 3  Encounters:  05/27/21 28.12 kg/m  05/17/21 28.12 kg/m  05/13/21 28.12 kg/m    Assessment/Interventions: Review of patient past medical history, allergies, medications, health status, including review of consultants reports, laboratory and other test data, was performed as part of comprehensive evaluation and provision of chronic care management services.   SDOH:  (Social Determinants of Health) assessments and interventions performed: Yes SDOH Interventions    Flowsheet Row Most Recent Value  SDOH Interventions   Financial Strain Interventions Intervention Not Indicated  Transportation Interventions Intervention Not Indicated      SDOH Screenings   Alcohol Screen: Not on file  Depression (PHQ2-9): Not on file  Financial Resource Strain: Low Risk    Difficulty of Paying Living Expenses: Not hard at all  Food Insecurity: Not on file  Housing: Not on file  Physical Activity: Not on file  Social Connections: Not on file  Stress: Not on file  Tobacco Use: Low Risk    Smoking Tobacco Use: Never   Smokeless Tobacco Use: Never  Transportation Needs: No Transportation Needs   Lack of Transportation (Medical): No   Lack of Transportation (Non-Medical): No   Patient was doing water aerobics one day a week at [facility] prior to surgery. 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 Her friend lives < 5 minutes away. She can drive right now but is trying not to do too much after surgery.  She currently has home therapy coming twice a week for 4 more  weeks. She is not sleeping very well right now due to the pain. She tries not to take too much of the oxycodone as it makes her feel more groggy in the morning.  She does not have her full appetite back and she is not having constipation. She does drink lots of water, about 3-4 16 ounce bottles a day and drinks hot tea at night.  CCM Care Plan  Allergies  Allergen Reactions   Cephalosporins Anaphylaxis   Penicillins Anaphylaxis and Hives    Did it involve swelling of the face/tongue/throat, SOB, or low BP? Yes Did it involve sudden or severe rash/hives, skin peeling, or any reaction on the inside of your mouth or nose? Yes Did you need to seek medical attention at a hospital or doctor's office? Yes When did it last happen?      10+ years If all above answers are "NO", may proceed with cephalosporin use.     Anesthetics, Amide Nausea And Vomiting    Does not know name of it. States they put it on record foot center.    Betadine [Povidone Iodine] Other (See Comments)    "Skin Burn" caused scar on Left buttock   Latex Hives, Itching and Rash   Peach [Prunus Persica] Hives   Claritin [Loratadine]     Rash, pruritis     Medications Reviewed Today     Reviewed by Viona Gilmore, [facility] (Pharmacist) on 06/11/21 at [place] List Status: <None>   Medication Order Taking? Sig Documenting Provider Last Dose Status Informant  ALPRAZolam (XANAX) 0.5 MG tablet [id number] Yes Take 1/2 to 1 tablet (0.25-0.5 mg total) by mouth at bedtime as needed for anxiety. [name], [name], MD Taking Active   atorvastatin (LIPITOR) 20 MG tablet [id number] Yes TAKE 1 TABLET BY MOUTH ONCE DAILY [name], [name], MD Taking Active   baclofen (LIORESAL) 10 MG tablet [id number] Yes TAKE 1 TABLET BY MOUTH THREE TIMES DAILY AS NEEDED Suella Broad, MD  Taking Active   carvedilol (COREG) 25 MG tablet [id number] Yes Take 1 tablet (25 mg total) by mouth 2 (two) times daily with a meal. Hilty, Nadean Corwin, MD Taking Active Self   cholecalciferol (VITAMIN D3) 25 MCG (1000 UNIT) tablet [id number] Yes Take 1,000 Units by mouth daily. [provider] Taking Active Self  diphenhydrAMINE (BENADRYL) 25 MG tablet [id number] Yes Take 25 mg by mouth daily as needed for itching. [provider] Taking Active Self  DULoxetine (CYMBALTA) 60 MG capsule [id number] No Take 1 capsule (60 mg total) by mouth daily.  Patient not taking: Reported on 06/11/2021   [name], [name], MD Not Taking Active   Estradiol Acetate [facility]) 0.05 MG/24HR RING [id number] Yes USE ONE RING VAGINALLY EVERY 3 MONTHS AS DIRECTED  Taking Active   gabapentin (NEURONTIN) 400 MG capsule [id number] Yes TAKE 1 CAPSULE BY MOUTH 3 TIMES DAILY  Taking Active   meclizine (ANTIVERT) 25 MG tablet [id number] Yes TAKE 1 TABLET BY MOUTH 2 TIMES DAILY AS NEEDED FOR DIZZINESS Kayleen Memos, DO Taking Active   oxyCODONE-acetaminophen (PERCOCET) 7.5-325 MG tablet [id number] Yes Take 1 tablet by mouth every 6 (six) hours as needed for severe pain. [provider] Taking Active   PFIZER-BIONT COVID-19 VAC-TRIS SUSP injection [id number]   [provider]  Active Self  senna-docusate (SENOKOT-S) 8.6-50 MG tablet [id number] Yes Take 1 tablet by mouth 2 (two) times daily.  Patient taking differently: Take 1 tablet by mouth 2 (two) times daily as needed.   Loleta Dicker, PA Taking Active   telmisartan-hydrochlorothiazide (MICARDIS HCT) 40-12.5 MG tablet [id number] Yes Take 1 tablet by mouth daily. Pixie Casino, MD Taking Active Self  traMADol Veatrice Bourbon) 50 MG tablet [id number] Yes Take 50 mg by mouth every 6 (six) hours as needed. [provider] Taking Active             Patient Active Problem List   Diagnosis Date Noted   Uncontrolled pain 05/27/2021   Syncope 05/13/2021   Depression with anxiety 05/13/2021   Chronic diastolic CHF (congestive heart failure) ([place]) 05/13/2021   Chronic back pain 05/13/2021   Chest tightness 05/13/2021    Lower abdominal pain 05/13/2021   Syncope and collapse 01/01/2021   Abnormal echocardiogram 01/01/2021   BPPV (benign paroxysmal positional vertigo), left 12/26/2020   Odynophagia    Dysphagia 12/15/2020   Postural dizziness with presyncope 12/14/2020   Lumbar post-laminectomy syndrome    Chronic pain 09/28/2019   Lumbar spinal stenosis    Radicular pain    Gait disorder    Difficulty in urination 07/13/2018   Back pain 07/12/2018   Hyperlipidemia 04/16/2018   GAD (generalized anxiety disorder) 04/16/2018   AKI (acute kidney injury) ([place])    Benign essential HTN    Major depression, recurrent ([place])    Lumbar radiculopathy 03/09/2018   Paraparesis ([place]) 03/09/2018   Essential hypertension    Chronic nonintractable headache    Leukocytosis    Acute blood loss anemia    Postoperative pain    Generalized weakness    Spinal stenosis at L4-L5 level 03/04/2018   Spinal stenosis of lumbar region 12/28/2015   Chest pain 04/16/2015    Immunization History  Administered Date(s) Administered   Influenza,inj,Quad PF,6+ Mos 08/03/2018, 08/09/2019, 08/20/2020   Influenza,inj,quad, With Preservative 07/27/2017   PFIZER(Purple Top)SARS-COV-2 Vaccination 12/14/2019, 01/05/2020, 07/24/2020, 02/15/2021   Tdap 06/09/2014    Conditions to be addressed/monitored:  Hypertension, Hyperlipidemia, Depression, Anxiety, and Pain  Care Plan :  CCM Pharmacy Care Plan  Updates made by Viona Gilmore, [place] since 06/22/2021 12:00 AM     Problem: Problem: Hypertension, Hyperlipidemia, Depression, Anxiety, and Pain      Long-Range Goal: Patient-Specific Goal   Start Date: 06/11/2021  Expected End Date: 06/11/2022  This Visit's Progress: On track  Priority: High  Note:   Current Barriers:  Unable to independently monitor therapeutic efficacy Unable to achieve control of cholesterol   Pharmacist Clinical Goal(s):  Patient will achieve adherence to monitoring guidelines and medication  adherence to achieve therapeutic efficacy achieve control of cholesterol as evidenced by next lipid panel  through collaboration with PharmD and provider.   Interventions: 1:1 collaboration with [name], [name], MD regarding development and update of comprehensive plan of care as evidenced by provider attestation and co-signature Inter-disciplinary care team collaboration (see longitudinal plan of care) Comprehensive medication review performed; medication list updated in electronic medical record  Hypertension (BP goal <130/80) -Uncontrolled -Current treatment: Carvedilol 25 mg 1 tablet twice daily Telmisartan-HCTZ 40-12.5 mg 1 tablet daily -Medications previously tried: none  -Current home readings: 130/80 -Current dietary habits: cut back on salt; uses more pepper and cinnamon; looks at package labels -Current exercise habits: not able to right now -Denies hypotensive/hypertensive symptoms -Educated on BP goals and benefits of medications for prevention of heart attack, stroke and kidney damage; Exercise goal of 150 minutes per week; Importance of home blood pressure monitoring; Proper BP monitoring technique; -Counseled to monitor BP at home 2-3 times a week, document, and provide log at future appointments -Counseled on diet and exercise extensively Recommended to continue current medication  Hyperlipidemia: (LDL goal < 100) -Uncontrolled -Current treatment: Atorvastatin 20 mg 1 tablet daily -Medications previously tried: none  -Current dietary patterns: used to eat out a lot; eating more salads, baked chicken and [place] but does have some fried fish or chicken; eating a mot of fruit and some vegetables -Current exercise habits: not able to -Educated on Cholesterol goals;  Benefits of statin for ASCVD risk reduction; Importance of limiting foods high in cholesterol; Exercise goal of 150 minutes per week; -Counseled on diet and exercise extensively Recommended repeat lipid  panel and increasing statin if LDL still above goal.  Depression/Anxiety (Goal: minimize symptoms) -Not ideally controlled -Current treatment: Alprazolam 0.5 mg 1/2-1 tablet daily as needed Duloxetine 60 mg 1 capsule daily -Medications previously tried/failed: clonazepam -PHQ9: 10 -GAD7: 12 -Educated on Benefits of medication for symptom control Benefits of cognitive-behavioral therapy with or without medication -Counseled on differences between duloxetine and alprazolam for anxiety. Recommended restarting taking duloxetine every day.  Pain (Goal: minimize pain) -Not ideally controlled -Current treatment  Tamadol 50 mg as needed Gabapentin 400 mg 1 capsule 3 times daily Baclofen 10 mg 1 tablet three times daily as needed -Medications previously tried: n/a  -Counseled on risk for sedation and separating these medications.  Health Maintenance -Vaccine gaps: COVID, shingrix, prevnar, influenza -Current therapy:  Femring every 3 months (plan to taper off the Femring)  Meclizine 25 mg as needed (not taken in a while - Vertigo) Benadryl 25 mg as needed Senokot-S 6.8-50 mg as needed -Educated on Cost vs benefit of each product must be carefully weighed by individual consumer -Patient is satisfied with current therapy and denies issues -Counseled on long term risks of estrogen therapy and patient reports the plan is to stop it soon.  Patient Goals/Self-Care Activities Patient will:  - take medications as prescribed check blood pressure at least weekly, document, and provide  at future appointments target a minimum of 150 minutes of moderate intensity exercise weekly  Follow Up Plan: Telephone follow up appointment with care management team member scheduled for: 6 months       Medication Assistance: None required.  Patient affirms current coverage meets needs.  Compliance/Adherence/Medication fill history: Care Gaps: Shingrix, influenza, COVID, prevnar  Star-Rating  Drugs: Telmisartan -HCTZ 40-12.5 mg - Last filled 03-27-2021 90 DS at [facility] Atorvastatin (Lipitor) 20 mg - Last filled 06-04-2021 90 DS at [facility]  Patient's preferred pharmacy is:  [facility] [name] [address] Phone: [phone] Fax: [phone]  Uses pill box? Yes - has an AM/PM medicine Pt endorses 99% compliance  We discussed: Current pharmacy is preferred with insurance plan and patient is satisfied with pharmacy services Patient decided to: Continue current medication management strategy  Care Plan and Follow Up Patient Decision:  Patient agrees to Care Plan and Follow-up.  Plan: Telephone follow up appointment with care management team member scheduled for:  6 months  Jeni Salles, PharmD, [place] Pharmacist [place] [facility] [place] [phone]

## 2021-06-12 ENCOUNTER — Ambulatory Visit: Payer: PPO | Admitting: Physical Medicine & Rehabilitation

## 2021-06-21 ENCOUNTER — Ambulatory Visit: Payer: PPO | Admitting: Family Medicine

## 2021-06-22 NOTE — Patient Instructions (Signed)
Hi [name],  It was great to get to meet you in person! Good luck with your recovery! Below is a summary of some of the topics we discussed.   Please reach out to me if you have any questions or need anything before our follow up!  Best, Maddie  Jeni Salles, PharmD, [facility]   Visit Information   Goals Addressed   None    Patient Care Plan: CCM Pharmacy Care Plan     Problem Identified: Problem: Hypertension, Hyperlipidemia, Depression, Anxiety, and Pain      Long-Range Goal: Patient-Specific Goal   Start Date: 06/11/2021  Expected End Date: 06/11/2022  This Visit's Progress: On track  Priority: High  Note:   Current Barriers:  Unable to independently monitor therapeutic efficacy Unable to achieve control of cholesterol   Pharmacist Clinical Goal(s):  Patient will achieve adherence to monitoring guidelines and medication adherence to achieve therapeutic efficacy achieve control of cholesterol as evidenced by next lipid panel  through collaboration with PharmD and provider.   Interventions: 1:1 collaboration with [name], [name], MD regarding development and update of comprehensive plan of care as evidenced by provider attestation and co-signature Inter-disciplinary care team collaboration (see longitudinal plan of care) Comprehensive medication review performed; medication list updated in electronic medical record  Hypertension (BP goal <130/80) -Uncontrolled -Current treatment: Carvedilol 25 mg 1 tablet twice daily Telmisartan-HCTZ 40-12.5 mg 1 tablet daily -Medications previously tried: none  -Current home readings: 130/80 -Current dietary habits: cut back on salt; uses more pepper and cinnamon; looks at package labels -Current exercise habits: not able to right now -Denies hypotensive/hypertensive symptoms -Educated on BP goals and benefits of medications for prevention of heart attack, stroke and  kidney damage; Exercise goal of 150 minutes per week; Importance of home blood pressure monitoring; Proper BP monitoring technique; -Counseled to monitor BP at home 2-3 times a week, document, and provide log at future appointments -Counseled on diet and exercise extensively Recommended to continue current medication  Hyperlipidemia: (LDL goal < 100) -Uncontrolled -Current treatment: Atorvastatin 20 mg 1 tablet daily -Medications previously tried: none  -Current dietary patterns: used to eat out a lot; eating more salads, baked chicken and [place] but does have some fried fish or chicken; eating a mot of fruit and some vegetables -Current exercise habits: not able to -Educated on Cholesterol goals;  Benefits of statin for ASCVD risk reduction; Importance of limiting foods high in cholesterol; Exercise goal of 150 minutes per week; -Counseled on diet and exercise extensively Recommended repeat lipid panel and increasing statin if LDL still above goal.  Depression/Anxiety (Goal: minimize symptoms) -Not ideally controlled -Current treatment: Alprazolam 0.5 mg 1/2-1 tablet daily as needed Duloxetine 60 mg 1 capsule daily -Medications previously tried/failed: clonazepam -PHQ9: 10 -GAD7: 12 -Educated on Benefits of medication for symptom control Benefits of cognitive-behavioral therapy with or without medication -Counseled on differences between duloxetine and alprazolam for anxiety. Recommended restarting taking duloxetine every day.  Pain (Goal: minimize pain) -Not ideally controlled -Current treatment  Tamadol 50 mg as needed Gabapentin 400 mg 1 capsule 3 times daily Baclofen 10 mg 1 tablet three times daily as needed -Medications previously tried: n/a  -Counseled on risk for sedation and separating these medications.  Health Maintenance -Vaccine gaps: COVID, shingrix, prevnar, influenza -Current therapy:  Femring every 3 months (plan to taper off the Femring)  Meclizine 25  mg as needed (not taken in a while - Vertigo) Benadryl 25 mg as  needed Senokot-S 6.8-50 mg as needed -Educated on Cost vs benefit of each product must be carefully weighed by individual consumer -Patient is satisfied with current therapy and denies issues -Counseled on long term risks of estrogen therapy and patient reports the plan is to stop it soon.  Patient Goals/Self-Care Activities Patient will:  - take medications as prescribed check blood pressure at least weekly, document, and provide at future appointments target a minimum of 150 minutes of moderate intensity exercise weekly  Follow Up Plan: Telephone follow up appointment with care management team member scheduled for: 6 months      [name] was given information about Chronic Care Management services today including:  CCM service includes personalized support from designated clinical staff supervised by her physician, including individualized plan of care and coordination with other care providers 24/7 contact phone numbers for assistance for urgent and routine care needs. Standard insurance, coinsurance, copays and deductibles apply for chronic care management only during months in which we provide at least 20 minutes of these services. Most insurances cover these services at 100%, however patients may be responsible for any copay, coinsurance and/or deductible if applicable. This service may help you avoid the need for more expensive face-to-face services. Only one practitioner may furnish and bill the service in a calendar month. The patient may stop CCM services at any time (effective at the end of the month) by phone call to the office staff.  Patient agreed to services and verbal consent obtained.   Patient verbalizes understanding of instructions provided today and agrees to view in [place].  Telephone follow up appointment with pharmacy team member scheduled for: 6 months  Viona Gilmore, [facility]

## 2021-06-28 ENCOUNTER — Ambulatory Visit
Admission: RE | Admit: 2021-06-28 | Discharge: 2021-06-28 | Disposition: A | Discharge status: Home / Self Care | Payer: PPO | Source: Ambulatory Visit | Attending: Obstetrics | Admitting: Obstetrics

## 2021-06-28 ENCOUNTER — Other Ambulatory Visit: Payer: Self-pay

## 2021-06-28 DIAGNOSIS — Z9181 History of falling: Secondary | ICD-10-CM | POA: Diagnosis not present

## 2021-06-28 DIAGNOSIS — G894 Chronic pain syndrome: Secondary | ICD-10-CM | POA: Diagnosis not present

## 2021-06-28 DIAGNOSIS — Z1231 Encounter for screening mammogram for malignant neoplasm of breast: Secondary | ICD-10-CM

## 2021-06-28 DIAGNOSIS — I700 Atherosclerosis of aorta: Secondary | ICD-10-CM | POA: Diagnosis not present

## 2021-06-28 DIAGNOSIS — M21371 Foot drop, right foot: Secondary | ICD-10-CM | POA: Diagnosis not present

## 2021-06-28 DIAGNOSIS — M48061 Spinal stenosis, lumbar region without neurogenic claudication: Secondary | ICD-10-CM | POA: Diagnosis not present

## 2021-06-28 DIAGNOSIS — I119 Hypertensive heart disease without heart failure: Secondary | ICD-10-CM | POA: Diagnosis not present

## 2021-06-28 DIAGNOSIS — Z4789 Encounter for other orthopedic aftercare: Secondary | ICD-10-CM | POA: Diagnosis not present

## 2021-06-28 DIAGNOSIS — K219 Gastro-esophageal reflux disease without esophagitis: Secondary | ICD-10-CM | POA: Diagnosis not present

## 2021-06-28 DIAGNOSIS — I4891 Unspecified atrial fibrillation: Secondary | ICD-10-CM | POA: Diagnosis not present

## 2021-06-28 DIAGNOSIS — R918 Other nonspecific abnormal finding of lung field: Secondary | ICD-10-CM | POA: Diagnosis not present

## 2021-06-28 DIAGNOSIS — M5134 Other intervertebral disc degeneration, thoracic region: Secondary | ICD-10-CM | POA: Diagnosis not present

## 2021-06-28 DIAGNOSIS — Z9682 Presence of neurostimulator: Secondary | ICD-10-CM | POA: Diagnosis not present

## 2021-06-28 DIAGNOSIS — E785 Hyperlipidemia, unspecified: Secondary | ICD-10-CM | POA: Diagnosis not present

## 2021-06-28 DIAGNOSIS — I051 Rheumatic mitral insufficiency: Secondary | ICD-10-CM | POA: Diagnosis not present

## 2021-06-28 DIAGNOSIS — M47814 Spondylosis without myelopathy or radiculopathy, thoracic region: Secondary | ICD-10-CM | POA: Diagnosis not present

## 2021-06-28 DIAGNOSIS — I209 Angina pectoris, unspecified: Secondary | ICD-10-CM | POA: Diagnosis not present

## 2021-06-28 DIAGNOSIS — F419 Anxiety disorder, unspecified: Secondary | ICD-10-CM | POA: Diagnosis not present

## 2021-07-02 ENCOUNTER — Other Ambulatory Visit (HOSPITAL_COMMUNITY): Payer: Self-pay

## 2021-07-04 ENCOUNTER — Ambulatory Visit (INDEPENDENT_AMBULATORY_CARE_PROVIDER_SITE_OTHER): Payer: PPO

## 2021-07-04 ENCOUNTER — Other Ambulatory Visit: Payer: Self-pay

## 2021-07-04 DIAGNOSIS — Z23 Encounter for immunization: Secondary | ICD-10-CM

## 2021-07-08 ENCOUNTER — Ambulatory Visit (HOSPITAL_BASED_OUTPATIENT_CLINIC_OR_DEPARTMENT_OTHER): Payer: PPO | Admitting: Family

## 2021-07-10 ENCOUNTER — Other Ambulatory Visit (HOSPITAL_COMMUNITY): Payer: Self-pay

## 2021-07-10 ENCOUNTER — Other Ambulatory Visit: Payer: Self-pay

## 2021-07-10 ENCOUNTER — Ambulatory Visit (HOSPITAL_BASED_OUTPATIENT_CLINIC_OR_DEPARTMENT_OTHER): Payer: PPO | Admitting: Internal Medicine

## 2021-07-10 ENCOUNTER — Encounter (HOSPITAL_BASED_OUTPATIENT_CLINIC_OR_DEPARTMENT_OTHER): Payer: Self-pay | Admitting: Internal Medicine

## 2021-07-10 VITALS — BP 145/83 | HR 79 | Ht 65.0 in | Wt 168.0 lb

## 2021-07-10 DIAGNOSIS — I422 Other hypertrophic cardiomyopathy: Secondary | ICD-10-CM

## 2021-07-10 DIAGNOSIS — Z01812 Encounter for preprocedural laboratory examination: Secondary | ICD-10-CM

## 2021-07-10 DIAGNOSIS — I10 Essential (primary) hypertension: Secondary | ICD-10-CM | POA: Diagnosis not present

## 2021-07-10 DIAGNOSIS — R079 Chest pain, unspecified: Secondary | ICD-10-CM | POA: Diagnosis not present

## 2021-07-10 MED ORDER — TELMISARTAN 40 MG PO TABS
40.0000 mg | ORAL_TABLET | Freq: Every day | ORAL | 3 refills | Status: DC
  Filled 2021-07-10: qty 90, 90d supply, fill #0

## 2021-07-10 MED ORDER — CARVEDILOL 25 MG PO TABS
50.0000 mg | ORAL_TABLET | Freq: Two times a day (BID) | ORAL | 3 refills | Status: DC
  Filled 2021-07-10: qty 360, 90d supply, fill #0
  Filled 2022-04-03: qty 360, 90d supply, fill #1

## 2021-07-10 NOTE — Progress Notes (Signed)
OFFICE NOTE  Chief Complaint:  Follow-up chest pain, dyspnea  Primary Care Physician: [place], [name], MD  HPI:  [name] is a 60 y.o. female with a past medial history significant for hypertension, dyslipidemia, lumbar back pain, anxiety and echo evidence of possible hypertrophic cardiomyopathy.  I had seen her in the past in the hospital but basically she has been followed by our APP's in the office until recently.  She had an echo in February which showed dynamic LVEF 65 to 70% with severe asymmetric left ventricular hypertrophy and a septal dimension of 1.9 cm.  I had recommended a cardiac MRI however this was not performed due to the fact that she has spinal stimulator.  Subsequently she had another echo 2 months later that I did not order but was interpreted as having normal septal thickness.  I personally reviewed the echo and disagree with that finding.  The septum is completely mis-measured.  It actually does measure about 2 cm.  This is consistent with prior findings.  There is family history concerning for hypertrophic cardiomyopathy.  We discussed that in more detail today.  She does have uncontrolled hypertension as well and blood pressure was quite high today 174/102.  She seems to be tolerating carvedilol which was started in the hospital and likely would benefit from more.  With regards to her spinal stimulator, her orthopedic spine surgeon recommended that she could either have the leads replaced with MRI compatible or perhaps have it removed.  She says it actually does not seem to have helped her very much and she is considering having it removed.  03/27/2021  [name] returns today for follow-up.  She continues to have concerns about chest discomfort.  We had discussed pursuing a possible cardiac MRI to evaluate for cardiomyopathy although I do not think these are causing her symptoms.  The issue is that she would have to have her spinal stimulator lead  removed and her orthospine surgeon did not want to do this.  She is sought out another opinion from and may have that performed at an outside hospital.  Blood pressure looks like it continues to be an issue and this may be actually causing a lot of her symptoms.  Of course she could have coronary disease that we have not picked up on as well.  07/10/2021  [name] returns today for follow-up.  Recently she had removal of a spinal stimulator.  She had some difficult recovery from this including a lot of back pain but it seems to be improving.  She was noted to have significant asymmetric septal hypertrophy measuring 1.9 cm by echo in February 2022.  There was no evidence of significant outflow gradient.  Subsequently she had a repeat echo in April.  The septum was measured to 0.65 cm which is surprisingly low and I then personally reviewed the images and noted that it has been missed measured.  The septum actually measures about 1.85 cm which is more consistent with her prior echo.  I suspect that she does have hypertrophic cardiomyopathy.  There is a family history of this and her brother who actually died with it.  He was advised to have septal myomectomy but declined the surgery.  She continues to have some chest discomfort although coronary CT angiography was performed in June 2022 which demonstrated no coronary calcium and no obstructive coronary disease.  We had discussed the definitive cardiac MRI and she said that she is now interested in  doing that since her spinal stimulator leads have been removed.  She did recently have successful MRI of her back.  PMHx:  Past Medical History:  Diagnosis Date   Anxiety    Aortic atherosclerosis ([place]) 04/16/2021   Atypical angina (HCC)    Back pain    related to spinal stenosis and disc problem, radiates down left buttocks to leg., weakness occ.   Chest pain    a. 03/2015 Cath: nl cors; b. 03/2021 Cor CTA: Ca2+ = 0. Nl Cors.   Chronic pain  syndrome    Dyspnea    GERD (gastroesophageal reflux disease)    Grade I diastolic dysfunction    Headache    Hyperlipidemia    Hypertension    Lumbar post-laminectomy syndrome    LVH (left ventricular hypertrophy) 12/15/2020   a. 11/2020 Echo: EF 65-70%, no rwma, sev asymm LVH with IVSd 1.9 cm. No LVOT obs @ rest. Gr1 DD. Triv MR.   PONV (postoperative nausea and vomiting)    Pulmonary nodules    a. 03/2021 CT Chest: 72mm pulm nodules in bilat lower lobes. F/u 1 yr.   Right foot drop    Syncope    a. 11/2020 Zio: No significant arrhythmias.   Vaginal foreign object    "Uses Femring"    Past Surgical History:  Procedure Laterality Date   ABDOMINAL HYSTERECTOMY     BIOPSY  12/16/2020   Procedure: BIOPSY;  Surgeon: Ladene Artist, MD;  Location: [facility];  Service: Endoscopy;;   CARDIAC CATHETERIZATION N/A 04/18/2015   Procedure: Left Heart Cath and Coronary Angiography;  Surgeon: [name], MD;  Location: [facility];  Service: Cardiovascular;  Laterality: N/A;   COLONOSCOPY W/ BIOPSIES AND POLYPECTOMY     ESOPHAGOGASTRODUODENOSCOPY (EGD) WITH PROPOFOL N/A 12/16/2020   Procedure: ESOPHAGOGASTRODUODENOSCOPY (EGD) WITH PROPOFOL;  Surgeon: Ladene Artist, MD;  Location: [facility] ENDOSCOPY;  Service: Endoscopy;  Laterality: N/A;   FOOT SURGERY Bilateral    [place] "bunion,bone spur, tendon" (1) -6'16, (1)-10'16   IR EPIDUROGRAPHY  07/21/2018   LUMBAR LAMINECTOMY/DECOMPRESSION MICRODISCECTOMY Bilateral 12/28/2015   Procedure: MICRO LUMBAR DECOMPRESSION L4 - L5 BILATERALLY;  Surgeon: [name], MD;  Location: [facility];  Service: Orthopedics;  Laterality: Bilateral;   LUMBAR LAMINECTOMY/DECOMPRESSION MICRODISCECTOMY Bilateral 03/04/2018   Procedure: Revision of Microlumbar Decompression Bilateral Lumbar Four-Five;  Surgeon: [name], MD;  Location: [facility];  Service: Orthopedics;  Laterality: Bilateral;  90 mins   SAVORY DILATION N/A 12/16/2020   Procedure: SAVORY  DILATION;  Surgeon: Ladene Artist, MD;  Location[facility];  Service: Endoscopy;  Laterality: N/A;   SPINAL CORD STIMULATOR INSERTION N/A 09/28/2019   Procedure: THORACIC SPINAL CORD STIMULATOR INSERTION;  Surgeon: Melina Schools, MD;  Location: [facility];  Service: Orthopedics;  Laterality: N/A;  2.5 hrs   SPINAL CORD STIMULATOR REMOVAL N/A 05/27/2021   Procedure: LUMBAR SPINAL CORD STIMULATOR REMOVAL;  Surgeon: Deetta Perla, MD;  Location: [facility] OR[facility];  Service: Neurosurgery;  Laterality: N/A;   TUBAL LIGATION     WISDOM TOOTH EXTRACTION     WOUND EXPLORATION N/A 03/04/2018   Procedure: EXPLORATION OF LUMBAR DECOMPRESSION WOUND;  Surgeon: [name], MD;  Location: [facility];  Service: Orthopedics;  Laterality: N/A;    FAMHx:  Family History  Problem Relation Age of Onset   Heart attack Mother    Lung cancer Father    Cancer Father    Pancreatic cancer Sister    Breast cancer Sister 83   Throat  cancer Brother    Multiple myeloma Sister    Breast cancer Sister        diagnosed in her 26's   Heart attack Sister    Stomach cancer Cousin    Colon cancer Neg Hx     SOCHx:   reports that she has never smoked. She has never used smokeless tobacco. She reports that she does not drink alcohol and does not use drugs.  ALLERGIES:  Allergies  Allergen Reactions   Cephalosporins Anaphylaxis   Penicillins Anaphylaxis and Hives    Did it involve swelling of the face/tongue/throat, SOB, or low BP? Yes Did it involve sudden or severe rash/hives, skin peeling, or any reaction on the inside of your mouth or nose? Yes Did you need to seek medical attention at a hospital or doctor's office? Yes When did it last happen?      10+ years If all above answers are "NO", may proceed with cephalosporin use.     Anesthetics, Amide Nausea And Vomiting    Does not know name of it. States they put it on record foot center.    Betadine [Povidone Iodine] Other (See Comments)    "Skin Burn" caused scar on  Left buttock   Latex Hives, Itching and Rash   Peach [Prunus Persica] Hives   Claritin [Loratadine]     Rash, pruritis     ROS: Pertinent items noted in HPI and remainder of comprehensive ROS otherwise negative.  HOME MEDS: Current Outpatient Medications on File Prior to Visit  Medication Sig Dispense Refill   ALPRAZolam (XANAX) 0.5 MG tablet Take 1/2 to 1 tablet (0.25-0.5 mg total) by mouth at bedtime as needed for anxiety. 30 tablet 1   atorvastatin (LIPITOR) 20 MG tablet TAKE 1 TABLET BY MOUTH ONCE DAILY 90 tablet 2   baclofen (LIORESAL) 10 MG tablet TAKE 1 TABLET BY MOUTH THREE TIMES DAILY AS NEEDED 90 tablet 3   carvedilol (COREG) 25 MG tablet Take 1 tablet (25 mg total) by mouth 2 (two) times daily with a meal. 180 tablet 3   cholecalciferol (VITAMIN D3) 25 MCG (1000 UNIT) tablet Take 1,000 Units by mouth daily.     diphenhydrAMINE (BENADRYL) 25 MG tablet Take 25 mg by mouth daily as needed for itching.     DULoxetine (CYMBALTA) 60 MG capsule Take 1 capsule (60 mg total) by mouth daily. 90 capsule 2   Estradiol Acetate (FEMRING) 0.05 MG/24HR RING USE ONE RING VAGINALLY EVERY 3 MONTHS AS DIRECTED 1 each 2   gabapentin (NEURONTIN) 400 MG capsule TAKE 1 CAPSULE BY MOUTH 3 TIMES DAILY 90 capsule 3   meclizine (ANTIVERT) 25 MG tablet TAKE 1 TABLET BY MOUTH 2 TIMES DAILY AS NEEDED FOR DIZZINESS 20 tablet 0   oxyCODONE-acetaminophen (PERCOCET) 7.5-325 MG tablet Take 1 tablet by mouth every 6 (six) hours as needed for severe pain.     senna-docusate (SENOKOT-S) 8.6-50 MG tablet Take 1 tablet by mouth 2 (two) times daily. (Patient taking differently: Take 1 tablet by mouth 2 (two) times daily as needed.) 60 tablet 0   telmisartan-hydrochlorothiazide (MICARDIS HCT) 40-12.5 MG tablet Take 1 tablet by mouth daily. 90 tablet 3   traMADol (ULTRAM) 50 MG tablet Take 50 mg by mouth every 6 (six) hours as needed.     No current facility-administered medications on file prior to visit.     LABS/IMAGING: No results found for this or any previous visit (from the past 48 hour(s)). No results found.  LIPID PANEL:  Component Value Date/Time   CHOL 198 04/20/2020 0955   TRIG 62.0 04/20/2020 0955   HDL 61.70 04/20/2020 0955   CHOLHDL 3 04/20/2020 0955   VLDL 12.4 04/20/2020 0955   LDLCALC 123 (H) 04/20/2020 0955     WEIGHTS: Wt Readings from Last 3 Encounters:  07/10/21 168 lb (76.2 kg)  05/27/21 169 lb (76.7 kg)  05/13/21 169 lb (76.7 kg)    VITALS: BP (!) 145/83   Pulse 79   Ht $R'5\' 5"'lG$  (1.651 m)   Wt 168 lb (76.2 kg)   SpO2 100%   BMI 27.96 kg/m   EXAM: Deferred  EKG: Deferred  ASSESSMENT: Chest pain/dyspnea Echocardiographic features of hypertrophic cardiomyopathy, IVSD 1.9 cm (11/2020) No evidence for LVOT obstruction Uncontrolled hypertension No coronary artery disease/coronary calcium by CT coronary angiography (03/2021) Family history of hypertrophic cardiomyopathy in her brother  PLAN: 51.   [name] continues to have some intermittent chest discomfort.  She had no evidence of coronary disease by CT.  The echo does show hypertrophic cardiomyopathy in my opinion although her repeat echo was missed measured.  Her septum appears to be about 1.8 to 1.9 cm and there was no evidence of clear LVOT obstruction.  There is family history of hypertrophic cardiomyopathy in her brother.  Would recommend we proceed with cardiac MRI at this point since she has had her spinal stimulator leads removed.  We will also discontinue the hydrochlorothiazide portion of her telmisartan promote increased preload and further increase her carvedilol from 25 to 50 mg twice daily for additional blood pressure lowering and heart rate control.  Plan follow-up with me in 3 months or sooner as necessary.  Pixie Casino, MD, [facility], [place] Director of the Advanced Lipid Disorders &  Cardiovascular Risk Reduction Clinic Diplomate  of the American Board of Clinical Lipidology Attending Cardiologist  Direct Dial: [phone]  Fax: [phone]  Website:  [email or web address] 07/10/2021, 10:29 AM

## 2021-07-10 NOTE — Patient Instructions (Addendum)
Medication Instructions:  STOP telmisartan/hctz START telmisartan 40 mg daily INCREASE carvedilol (Coreg) to 50 mg two times daily  *If you need a refill on your cardiac medications before your next appointment, please call your pharmacy*   Lab Work: BMET, CBC prior to MRI   If you have labs (blood work) drawn today and your tests are completely normal, you will receive your results only by: [place] (if you have MyChart) OR A paper copy in the mail If you have any lab test that is abnormal or we need to change your treatment, we will call you to review the results.   Testing/Procedures: Your physician has requested that you have a cardiac MRI. Cardiac MRI uses a computer to create images of your heart as its beating, producing both still and moving pictures of your heart and major blood vessels. For further information please visit [email or web address] Please follow the instruction sheet given to you today for more information.  Follow-Up: At [facility], you and your health needs are our priority.  As part of our continuing mission to provide you with exceptional heart care, we have created designated Provider Care Teams.  These Care Teams include your primary Cardiologist (physician) and Advanced Practice Providers (APPs -  Physician Assistants and Nurse Practitioners) who all work together to provide you with the care you need, when you need it.  We recommend signing up for the patient portal called "MyChart".  Sign up information is provided on this After Visit Summary.  MyChart is used to connect with patients for Virtual Visits (Telemedicine).  Patients are able to view lab/test results, encounter notes, upcoming appointments, etc.  Non-urgent messages can be sent to your provider as well.   To learn more about what you can do with MyChart, go to [email or web address].    Your next appointment:   3 month(s)  The format for your next appointment:   In  Person  Provider:   K. Mali Hilty, MD

## 2021-07-11 ENCOUNTER — Ambulatory Visit: Payer: PPO | Attending: Family Medicine | Admitting: Physical Therapy

## 2021-07-11 DIAGNOSIS — R29818 Other symptoms and signs involving the nervous system: Secondary | ICD-10-CM | POA: Diagnosis not present

## 2021-07-11 DIAGNOSIS — R2689 Other abnormalities of gait and mobility: Secondary | ICD-10-CM | POA: Insufficient documentation

## 2021-07-11 DIAGNOSIS — R2681 Unsteadiness on feet: Secondary | ICD-10-CM | POA: Insufficient documentation

## 2021-07-11 DIAGNOSIS — M6281 Muscle weakness (generalized): Secondary | ICD-10-CM | POA: Diagnosis not present

## 2021-07-12 ENCOUNTER — Encounter: Payer: Self-pay | Admitting: Physical Therapy

## 2021-07-12 NOTE — Therapy (Signed)
[facility] [address] [place], [name], [address] Phone: [phone]   Fax:  [phone]  Physical Therapy Evaluation  Patient Details  Name: [name] MRN: [id number] Date of Birth: [birth year] Referring Provider (PT): Loleta Dicker, [place]   Encounter Date: 07/11/2021   PT End of Session - 07/12/21 1617     Visit Number 1    Number of Visits 24    Date for PT Re-Evaluation 10/04/21    Authorization Type H[employer] medicare    Authorization Time Period 07-11-21 - 10-04-21    Progress Note Due on Visit 10    PT Start Time 1532    PT Stop Time 1620    PT Time Calculation (min) 48 min    Activity Tolerance Patient limited by pain;Patient limited by fatigue   limited by increased headache with sit to Rt sidelying   Behavior During Therapy [facility] for tasks assessed/performed             Past Medical History:  Diagnosis Date   Anxiety    Aortic atherosclerosis ([place]) 04/16/2021   Atypical angina (HCC)    Back pain    related to spinal stenosis and disc problem, radiates down left buttocks to leg., weakness occ.   Chest pain    a. 03/2015 Cath: nl cors; b. 03/2021 Cor CTA: Ca2+ = 0. Nl Cors.   Chronic pain syndrome    Dyspnea    GERD (gastroesophageal reflux disease)    Grade I diastolic dysfunction    Headache    Hyperlipidemia    Hypertension    Lumbar post-laminectomy syndrome    LVH (left ventricular hypertrophy) 12/15/2020   a. 11/2020 Echo: EF 65-70%, no rwma, sev asymm LVH with IVSd 1.9 cm. No LVOT obs @ rest. Gr1 DD. Triv MR.   PONV (postoperative nausea and vomiting)    Pulmonary nodules    a. 03/2021 CT Chest: 65m pulm nodules in bilat lower lobes. F/u 1 yr.   Right foot drop    Syncope    a. 11/2020 Zio: No significant arrhythmias.   Vaginal foreign object    "Uses Femring"    Past Surgical History:  Procedure Laterality Date   ABDOMINAL HYSTERECTOMY     BIOPSY  12/16/2020   Procedure:  BIOPSY;  Surgeon: SLadene Artist MD;  Location: [facility]  Service: Endoscopy;;   CARDIAC CATHETERIZATION N/A 04/18/2015   Procedure: Left Heart Cath and Coronary Angiography;  Surgeon: MCharolette Forward MD;  [facility];  Service: Cardiovascular;  Laterality: N/A;   COLONOSCOPY W/ BIOPSIES AND POLYPECTOMY     ESOPHAGOGASTRODUODENOSCOPY (EGD) WITH PROPOFOL N/A 12/16/2020   Procedure: ESOPHAGOGASTRODUODENOSCOPY (EGD) WITH PROPOFOL;  Surgeon: SLadene Artist MD;  [facility];  Service: Endoscopy;  Laterality: N/A;   FOOT SURGERY Bilateral    [place]"bunion,bone spur, tendon" (1) -6'16, (1)-10'16   IR EPIDUROGRAPHY  07/21/2018   LUMBAR LAMINECTOMY/DECOMPRESSION MICRODISCECTOMY Bilateral 12/28/2015   Procedure: MICRO LUMBAR DECOMPRESSION L4 - L5 BILATERALLY;  Surgeon: JSusa Day MD;  [facility];  Service: Orthopedics;  Laterality: Bilateral;   LUMBAR LAMINECTOMY/DECOMPRESSION MICRODISCECTOMY Bilateral 03/04/2018   Procedure: Revision of Microlumbar Decompression Bilateral Lumbar Four-Five;  Surgeon: BSusa Day MD;  [facility]  Service: Orthopedics;  Laterality: Bilateral;  90 mins   SAVORY DILATION N/A 12/16/2020   Procedure: SAVORY DILATION;  Surgeon: SLadene Artist MD;  [facility];  Service: Endoscopy;  Laterality: N/A;  SPINAL CORD STIMULATOR INSERTION N/A 09/28/2019   Procedure: THORACIC SPINAL CORD STIMULATOR INSERTION;  Surgeon: Melina Schools, MD;  Location: [facility];  Service: Orthopedics;  Laterality: N/A;  2.5 hrs   SPINAL CORD STIMULATOR REMOVAL N/A 05/27/2021   Procedure: LUMBAR SPINAL CORD STIMULATOR REMOVAL;  Surgeon: Deetta Perla, MD;  Location: [facility];  Service: Neurosurgery;  Laterality: N/A;   TUBAL LIGATION     WISDOM TOOTH EXTRACTION     WOUND EXPLORATION N/A 03/04/2018   Procedure: EXPLORATION OF LUMBAR DECOMPRESSION WOUND;  Surgeon: [name], MD;  Location: [facility];  Service: Orthopedics;  Laterality: N/A;     There were no vitals filed for this visit.    Subjective Assessment - 07/11/21 1531     Subjective Pt presents to PT eval s/p spinal cord stimulator removal on 05-27-21;  pt had onset of LLE weakness after surgery; pt states HH PT finished 2 weeks ago;  states Lt leg has gotten much stronger because she was unable to lift left leg at all after the surgery; pt is using rollator and wearin AFO on RLE which she did prior to sx on 05-27-21    Pertinent History Spinal cord stimulator insertion on 09-28-19; stimulator removal on 05-27-21 with resultant LLE weakness;  decompression and laminectomy on 12/28/15 at L4-L5  with redo decompression surgery on May 9,2019, post-op paraparesis with surgery to remove small hematoma; 07/12/18 fall at work (involved rolling chair) with hospital admission, transfer to [facility] with d/c home 08/03/18; HTN, Bil foot surgery; left ventricular hypertrophy, chronic pain syndrome, dyspnea, hypertrophic cardiomyopathy    Patient Stated Goals "To be able to move left leg like I was able to before I had the surgery on 05-27-21"    Currently in Pain? No/denies                [facility] PT Assessment - 07/12/21 0001       Assessment   Medical Diagnosis Post laminectomy syndrome:  LLE weakness s/p spinal cord stimulator removal on 05-27-21    Referring Provider (PT) Loleta Dicker, PA    Onset Date/Surgical Date 05/27/21    Hand Dominance Right    Next MD Visit Tuesday, 07-16-21    Prior Therapy pt has received land/aquatic therapy at this facility for leg weakness and gait abnormality from 10-15-18 - 01-11-19; 04-20-19 - 08-17-19;     01-12-20 - 01-14-21 for BPPV      Precautions   Precautions Fall      Restrictions   Weight Bearing Restrictions No      Balance Screen   Has the patient fallen in the past 6 months No    Has the patient had a decrease in activity level because of a fear of falling?  No    Is the patient reluctant to leave their home because of a fear of falling?  No       Home Environment   Living Environment Private residence    Home Access Stairs to enter    Entrance Stairs-Number of Steps 4      Observation/Other Assessments   Focus on Therapeutic Outcomes (FOTO)  N/A for pt's diagnosis      Strength   Overall Strength Deficits;Unable to assess;Due to pain    Overall Strength Comments pain in low back with resistance       Transfers   Transfers Sit to Stand    Five time sit to stand comments  32.72    Number of Reps Other  reps (comment)   3 reps due to c/o pain with transfer     Ambulation/Gait   Ambulation/Gait Yes    Ambulation/Gait Assistance 6: Modified independent (Device/Increase time)    Ambulation Distance (Feet) 75 Feet    Assistive device Rollator    Gait Pattern Step-through pattern;Decreased hip/knee flexion - right;Decreased dorsiflexion - right    Ambulation Surface Level;Indoor    Gait velocity 32.16=1.02 ft/sec with rollator    Gait Comments pt wearing Ottobock walk on AFO on RLE      Balance   Balance Assessed Yes      Static Standing Balance   Static Standing - Balance Support Bilateral upper extremity supported    Static Standing - Level of Assistance 6: Modified independent (Device/Increase time)    Static Standing - Comment/# of Minutes 2      Timed Up and Go Test   TUG Normal TUG    Normal TUG (seconds) 39.78   with rollator                       Objective measurements completed on examination: See above findings.                PT Education - 07/12/21 1616     Education Details eval results and POC - plan to add aquatic PT when MD orders    Person(s) Educated Patient    Methods Explanation    Comprehension Verbalized understanding              PT Short Term Goals - 07/12/21 1632       PT SHORT TERM GOAL #1   Title Pt will perform sit to stand transfers 5 reps with bil. UE support in </= 26 secs to demo improved LLE strength.    Baseline 3 reps - 32.72 secs with  bil. UE support    Time 4    Period Weeks    Status New    Target Date 08/09/21      PT SHORT TERM GOAL #2   Title Pt will improve TUG score from 39.78 secs to </= 34 secs with rollator to demo improved functional mobility.    Baseline 39.78 secs with rollator    Time 4    Period Weeks    Status New    Target Date 08/09/21      PT SHORT TERM GOAL #3   Title Pt will amb. 4" nonstop with use of rollator with SBA for increased endurance and to demo improved LE strength.    Time 4    Period Weeks    Status New    Target Date 08/09/21      PT SHORT TERM GOAL #4   Title Assess step negotiation with use of handrails.    Time 4    Period Weeks    Status New    Target Date 08/09/21      PT SHORT TERM GOAL #5   Title Pt will stand for 30 secs without UE support with SBA for increased independence with ADL's.    Time 4    Period Weeks    Status New    Target Date 08/09/21      Additional Short Term Goals   Additional Short Term Goals Yes      PT SHORT TERM GOAL #6   Title Independent in HEP for LLE strengthening and balance exercises.    Time 4    Period Weeks  Status New    Target Date 08/09/21               PT Long Term Goals - 07/12/21 1641       PT LONG TERM GOAL #1   Title Pt will amb. short distances, i.e. 61' with use of SPC with SBA.    Time 12    Period Weeks    Status New    Target Date 10/04/21      PT LONG TERM GOAL #2   Title Improve TUG score to </= 26 secs with use of rollator to demo improved functional mobility and reduced fall risk.    Baseline 39.78 secs with rollator    Time 12    Period Weeks    Status New    Target Date 10/04/21      PT LONG TERM GOAL #3   Title Pt will increase gait velocity from 1.02 ft/sec to >/= 1.8 ft/sec with rollator for increased gait efficiency.    Baseline 32.16 secs = 1.02 ft/sec with rollator on 07-11-21    Time 12    Period Weeks    Status New    Target Date 10/04/21      PT LONG TERM GOAL #4    Title Pt will stand for at least 10" with UE support prn for increased independence with ADL's.    Time 12    Period Weeks    Status New    Target Date 10/04/21      PT LONG TERM GOAL #5   Title Pt will be independent in HEP for strengthening & balance exercises.    Time 12    Period Weeks    Status New    Target Date 10/04/21                    Plan - 07/12/21 1620     Clinical Impression Statement Pt is a 60 yr old lady s/p spinal cord stimulator removal on 05-27-21 with resultant LLE weakness with inability to ambulate immediately after the surgery.  Pt was hospitalized 05-27-21 - 05-31-21 at [facility].  PMH includes lumbar laminectomy- decompression micro discectomy bilateral on 03-14-18.  Pt underwent spinal cord stimulator insertion on 09-28-19.  Pt had RLE weakness following laminectomy surgery in May 2019 but had not had LLE weakness until after the surgery on 05-27-21 to remove the spinal cord stimulator.  Pt has recently obtained a new AFO for her RLE (Ottobock walk on AFO).  Pt is using rollator for assistance with ambulation, as she was prior to surgery on 05-27-21.  Pt was previously able to amb. very short distances with use of SPC and had used [employer] crutches in the past, before progressing to use of SPC for household distances.  Pt will benefit from PT to address LLE weakness, gait and balance impairments.    Personal Factors and Comorbidities Past/Current Experience;Comorbidity 2;Time since onset of injury/illness/exacerbation    Comorbidities hypertrophic cardiomyopathy, left ventricular hypertrophy, post laminectomy syndrome, chronic pain syndrome, dyspnea, s/p spinal cord stimulator removal    Examination-Activity Limitations Stand;Locomotion Level;Bend;Squat;Stairs;Transfers;Carry;Reach Overhead    Examination-Participation Restrictions Cleaning;Community Activity;Meal Prep;Shop;Laundry;Church;Driving    Stability/Clinical Decision Making  Evolving/Moderate complexity    Clinical Decision Making Moderate    Clinical Presentation due to: paraparesis due to lumbar radiculopathy with new onset LLE weakness after sx for spinal cord stimulator removal on 05-27-21    Rehab Potential Good    PT Frequency 2x /  week    PT Duration 12 weeks    PT Treatment/Interventions ADLs/Self Care Home Management;Neuromuscular re-education;Balance training;Patient/family education;Aquatic Therapy;Scientist, product/process development;Therapeutic exercise;Stair training;Gait training;Orthotic Fit/Training    PT Next Visit Plan do 3" walk test; static standing for 2" or longer, issue HEP for LLE strengthening    Consulted and Agree with Plan of Care Patient             Patient will benefit from skilled therapeutic intervention in order to improve the following deficits and impairments:  Pain, Difficulty walking, Decreased strength, Decreased activity tolerance, Abnormal gait, Increased muscle spasms, Decreased endurance, Decreased balance, Decreased coordination, Decreased knowledge of use of DME, Decreased mobility, Decreased safety awareness, Impaired tone  Visit Diagnosis: Other abnormalities of gait and mobility - Plan: PT plan of care cert/re-cert  Unsteadiness on feet - Plan: PT plan of care cert/re-cert  Muscle weakness (generalized) - Plan: PT plan of care cert/re-cert  Other symptoms and signs involving the nervous system - Plan: PT plan of care cert/re-cert     Problem List Patient Active Problem List   Diagnosis Date Noted   Uncontrolled pain 05/27/2021   Syncope 05/13/2021   Depression with anxiety 05/13/2021   Chronic diastolic CHF (congestive heart failure) (HCC) 05/13/2021   Chronic back pain 05/13/2021   Chest tightness 05/13/2021   Lower abdominal pain 05/13/2021   Syncope and collapse 01/01/2021   Abnormal echocardiogram 01/01/2021   BPPV (benign paroxysmal positional vertigo), left 12/26/2020   Odynophagia     Dysphagia 12/15/2020   Postural dizziness with presyncope 12/14/2020   Lumbar post-laminectomy syndrome    Chronic pain 09/28/2019   Lumbar spinal stenosis    Radicular pain    Gait disorder    Difficulty in urination 07/13/2018   Back pain 07/12/2018   Hyperlipidemia 04/16/2018   GAD (generalized anxiety disorder) 04/16/2018   AKI (acute kidney injury) ([place])    Benign essential HTN    Major depression, recurrent (HCC)    Lumbar radiculopathy 03/09/2018   Paraparesis ([place]) 03/09/2018   Essential hypertension    Chronic nonintractable headache    Leukocytosis    Acute blood loss anemia    Postoperative pain    Generalized weakness    Spinal stenosis at L4-L5 level 03/04/2018   Spinal stenosis of lumbar region 12/28/2015   Chest pain 04/16/2015    Alda Lea, PT 07/12/2021, 4:51 PM  [facility] [address] [place], [name], [address] Phone: [phone]   Fax:  [phone]  Name: [name] MRN: [id number] Date of Birth: 1961/04/14

## 2021-07-15 ENCOUNTER — Encounter: Payer: Self-pay | Admitting: Physical Therapy

## 2021-07-15 ENCOUNTER — Other Ambulatory Visit (HOSPITAL_COMMUNITY): Payer: Self-pay

## 2021-07-15 ENCOUNTER — Ambulatory Visit: Payer: PPO | Admitting: Physical Therapy

## 2021-07-15 ENCOUNTER — Other Ambulatory Visit: Payer: Self-pay

## 2021-07-15 DIAGNOSIS — R2689 Other abnormalities of gait and mobility: Secondary | ICD-10-CM | POA: Diagnosis not present

## 2021-07-15 DIAGNOSIS — M6281 Muscle weakness (generalized): Secondary | ICD-10-CM

## 2021-07-15 MED FILL — Baclofen Tab 10 MG: ORAL | 30 days supply | Qty: 90 | Fill #0 | Status: AC

## 2021-07-16 NOTE — Therapy (Signed)
[facility] [address] [place], [name], [address] Phone: [phone]   Fax:  [phone]  Physical Therapy Treatment  Patient Details  Name: [name] MRN: [id number] Date of Birth: December 07, 1960 Referring Provider (PT): Loleta Dicker, [place]   Encounter Date: 07/15/2021   PT End of Session - 07/16/21 1910     Visit Number 2    Number of Visits 24    Date for PT Re-Evaluation 10/04/21    Authorization Type HTA medicare    Authorization Time Period 07-11-21 - 10-04-21    Progress Note Due on Visit 10    PT Start Time 1102    PT Stop Time 1146    PT Time Calculation (min) 44 min    Activity Tolerance Patient limited by pain;Patient limited by fatigue   limited by increased headache with sit to Rt sidelying   Behavior During Therapy [facility] for tasks assessed/performed             Past Medical History:  Diagnosis Date   Anxiety    Aortic atherosclerosis ([place]) 04/16/2021   Atypical angina (HCC)    Back pain    related to spinal stenosis and disc problem, radiates down left buttocks to leg., weakness occ.   Chest pain    a. 03/2015 Cath: nl cors; b. 03/2021 Cor CTA: Ca2+ = 0. Nl Cors.   Chronic pain syndrome    Dyspnea    GERD (gastroesophageal reflux disease)    Grade I diastolic dysfunction    Headache    Hyperlipidemia    Hypertension    Lumbar post-laminectomy syndrome    LVH (left ventricular hypertrophy) 12/15/2020   a. 11/2020 Echo: EF 65-70%, no rwma, sev asymm LVH with IVSd 1.9 cm. No LVOT obs @ rest. Gr1 DD. Triv MR.   PONV (postoperative nausea and vomiting)    Pulmonary nodules    a. 03/2021 CT Chest: 47mm pulm nodules in bilat lower lobes. F/u 1 yr.   Right foot drop    Syncope    a. 11/2020 Zio: No significant arrhythmias.   Vaginal foreign object    "Uses Femring"    Past Surgical History:  Procedure Laterality Date   ABDOMINAL HYSTERECTOMY     BIOPSY  12/16/2020   Procedure:  BIOPSY;  Surgeon: Ladene Artist, MD;  Location: [place];  Service: Endoscopy;;   CARDIAC CATHETERIZATION N/A 04/18/2015   Procedure: Left Heart Cath and Coronary Angiography;  Surgeon: [name], MD;  Location: [place] CV LAB;  Service: Cardiovascular;  Laterality: N/A;   COLONOSCOPY W/ BIOPSIES AND POLYPECTOMY     ESOPHAGOGASTRODUODENOSCOPY (EGD) WITH PROPOFOL N/A 12/16/2020   Procedure: ESOPHAGOGASTRODUODENOSCOPY (EGD) WITH PROPOFOL;  Surgeon: Ladene Artist, MD;  Location: [facility] ENDOSCOPY;  Service: Endoscopy;  Laterality: N/A;   FOOT SURGERY Bilateral    [place] "bunion,bone spur, tendon" (1) -6'16, (1)-10'16   IR EPIDUROGRAPHY  07/21/2018   LUMBAR LAMINECTOMY/DECOMPRESSION MICRODISCECTOMY Bilateral 12/28/2015   Procedure: MICRO LUMBAR DECOMPRESSION L4 - L5 BILATERALLY;  Surgeon: [name], MD;  Location: [facility];  Service: Orthopedics;  Laterality: Bilateral;   LUMBAR LAMINECTOMY/DECOMPRESSION MICRODISCECTOMY Bilateral 03/04/2018   Procedure: Revision of Microlumbar Decompression Bilateral Lumbar Four-Five;  Surgeon: [name], MD;  Location: [place];  Service: Orthopedics;  Laterality: Bilateral;  90 mins   SAVORY DILATION N/A 12/16/2020   Procedure: SAVORY DILATION;  Surgeon: Ladene Artist, MD;  Location: [facility] ENDOSCOPY;  Service: Endoscopy;  Laterality: N/A;  SPINAL CORD STIMULATOR INSERTION N/A 09/28/2019   Procedure: THORACIC SPINAL CORD STIMULATOR INSERTION;  Surgeon: Melina Schools, MD;  Location: [place];  Service: Orthopedics;  Laterality: N/A;  2.5 hrs   SPINAL CORD STIMULATOR REMOVAL N/A 05/27/2021   Procedure: LUMBAR SPINAL CORD STIMULATOR REMOVAL;  Surgeon: Deetta Perla, MD;  Location: [facility] OR[facility];  Service: Neurosurgery;  Laterality: N/A;   TUBAL LIGATION     WISDOM TOOTH EXTRACTION     WOUND EXPLORATION N/A 03/04/2018   Procedure: EXPLORATION OF LUMBAR DECOMPRESSION WOUND;  Surgeon: [name], MD;  Location: [place];  Service: Orthopedics;  Laterality: N/A;     There were no vitals filed for this visit.                      [place] Adult PT Treatment/Exercise - 07/16/21 0001       Ambulation/Gait   Ambulation/Gait Yes    Ambulation/Gait Assistance 4: Min guard    Ambulation Distance (Feet) 115 Feet    Assistive device Rollator    Gait Pattern Step-through pattern;Decreased hip/knee flexion - right;Decreased dorsiflexion - right    Ambulation Surface Level;Indoor    Gait Comments in 3" amb. test - 115'      Exercises   Exercises Knee/Hip      Knee/Hip Exercises: Standing   Other Standing Knee Exercises tap ups to 4" step with RLE for LLE closed chain strengthening 5 reps 2 sets;    Other Standing Knee Exercises stepping forward and back and then stepping laterally with RLE for increased LLE weight bearing - 5 reps each direction with RLE with bil. UE support on rollator      Knee/Hip Exercises: Seated   Long Arc Quad Strengthening;Left;2 sets;5 reps   2# 5 reps:  0# 5 reps;   Knee/Hip Flexion LLE 0# 5 reps    Other Seated Knee/Hip Exercises seated Lt hip abduction with knee flexed 10 reps: with knee extended 5 reps LLE    Hamstring Curl AROM;Left;1 set;10 reps                       PT Short Term Goals - 07/16/21 1917       PT SHORT TERM GOAL #1   Title Pt will perform sit to stand transfers 5 reps with bil. UE support in </= 26 secs to demo improved LLE strength.    Baseline 3 reps - 32.72 secs with bil. UE support    Time 4    Period Weeks    Status New    Target Date 08/09/21      PT SHORT TERM GOAL #2   Title Pt will improve TUG score from 39.78 secs to </= 34 secs with rollator to demo improved functional mobility.    Baseline 39.78 secs with rollator    Time 4    Period Weeks    Status New    Target Date 08/09/21      PT SHORT TERM GOAL #3   Title Pt will amb. 4" nonstop with use of rollator with SBA for increased endurance and to demo improved LE strength.    Time 4    Period Weeks     Status New    Target Date 08/09/21      PT SHORT TERM GOAL #4   Title Assess step negotiation with use of handrails.    Time 4    Period Weeks    Status New  Target Date 08/09/21      PT SHORT TERM GOAL #5   Title Pt will stand for 30 secs without UE support with SBA for increased independence with ADL's.    Time 4    Period Weeks    Status New    Target Date 08/09/21      PT SHORT TERM GOAL #6   Title Independent in HEP for LLE strengthening and balance exercises.    Time 4    Period Weeks    Status New    Target Date 08/09/21               PT Long Term Goals - 07/16/21 1918       PT LONG TERM GOAL #1   Title Pt will amb. short distances, i.e. 48' with use of SPC with SBA.    Time 12    Period Weeks    Status New      PT LONG TERM GOAL #2   Title Improve TUG score to </= 26 secs with use of rollator to demo improved functional mobility and reduced fall risk.    Baseline 39.78 secs with rollator    Time 12    Period Weeks    Status New      PT LONG TERM GOAL #3   Title Pt will increase gait velocity from 1.02 ft/sec to >/= 1.8 ft/sec with rollator for increased gait efficiency.    Baseline 32.16 secs = 1.02 ft/sec with rollator on 07-11-21    Time 12    Period Weeks    Status New      PT LONG TERM GOAL #4   Title Pt will stand for at least 10" with UE support prn for increased independence with ADL's.    Time 12    Period Weeks    Status New      PT LONG TERM GOAL #5   Title Pt will be independent in HEP for strengthening & balance exercises.    Time 12    Period Weeks    Status New                   Plan - 07/16/21 1910     Clinical Impression Statement Pt reporting increased pain in low back today due to having moved  incorrectly when she was quickly transferring from supine to sidelying to sitting to get out of bed quickly to get to restroom.  Pt fatigued after amb. approx. 15' with increased UE weight bearing needed for final 35'  amb distance in 3" time period, as pt reported she felt LLE was getting weaker at that distance due to fatigue.  Pt also reporting pain with active Lt hip flexion in seated position, with inability to lift Lt leg as high due to pain as she was able to do at previous session last week.  Cont with POC.    Personal Factors and Comorbidities Past/Current Experience;Comorbidity 2;Time since onset of injury/illness/exacerbation    Comorbidities hypertrophic cardiomyopathy, left ventricular hypertrophy, post laminectomy syndrome, chronic pain syndrome, dyspnea, s/p spinal cord stimulator removal    Examination-Activity Limitations Stand;Locomotion Level;Bend;Squat;Stairs;Transfers;Carry;Reach Overhead    Examination-Participation Restrictions Cleaning;Community Activity;Meal Prep;Shop;Laundry;Church;Driving    Stability/Clinical Decision Making Evolving/Moderate complexity    Rehab Potential Good    PT Frequency 2x / week    PT Duration 12 weeks    PT Treatment/Interventions ADLs/Self Care Home Management;Neuromuscular re-education;Balance training;Patient/family education;Aquatic Therapy;Scientist, product/process development;Therapeutic exercise;Stair training;Gait training;Orthotic Fit/Training  PT Next Visit Plan static standing for 2" or longer, issue HEP for LLE strengthening    Consulted and Agree with Plan of Care Patient             Patient will benefit from skilled therapeutic intervention in order to improve the following deficits and impairments:  Pain, Difficulty walking, Decreased strength, Decreased activity tolerance, Abnormal gait, Increased muscle spasms, Decreased endurance, Decreased balance, Decreased coordination, Decreased knowledge of use of DME, Decreased mobility, Decreased safety awareness, Impaired tone  Visit Diagnosis: Other abnormalities of gait and mobility  Muscle weakness (generalized)     Problem List Patient Active Problem List   Diagnosis Date Noted    Uncontrolled pain 05/27/2021   Syncope 05/13/2021   Depression with anxiety 05/13/2021   Chronic diastolic CHF (congestive heart failure) ([place]) 05/13/2021   Chronic back pain 05/13/2021   Chest tightness 05/13/2021   Lower abdominal pain 05/13/2021   Syncope and collapse 01/01/2021   Abnormal echocardiogram 01/01/2021   BPPV (benign paroxysmal positional vertigo), left 12/26/2020   Odynophagia    Dysphagia 12/15/2020   Postural dizziness with presyncope 12/14/2020   Lumbar post-laminectomy syndrome    Chronic pain 09/28/2019   Lumbar spinal stenosis    Radicular pain    Gait disorder    Difficulty in urination 07/13/2018   Back pain 07/12/2018   Hyperlipidemia 04/16/2018   GAD (generalized anxiety disorder) 04/16/2018   AKI (acute kidney injury) ([place])    Benign essential HTN    Major depression, recurrent ([place])    Lumbar radiculopathy 03/09/2018   Paraparesis ([place]) 03/09/2018   Essential hypertension    Chronic nonintractable headache    Leukocytosis    Acute blood loss anemia    Postoperative pain    Generalized weakness    Spinal stenosis at L4-L5 level 03/04/2018   Spinal stenosis of lumbar region 12/28/2015   Chest pain 04/16/2015    Elleen Coulibaly, [place], PT 07/16/2021, 7:19 PM  [facility] [address] [place], [name], [address] Phone: [phone]   Fax:  [phone]  Name: Otila Starn MRN: [id number] Date of Birth: Nov 09, 1960

## 2021-07-25 ENCOUNTER — Ambulatory Visit: Payer: PPO | Admitting: Physical Therapy

## 2021-07-29 ENCOUNTER — Other Ambulatory Visit: Payer: Self-pay

## 2021-07-29 ENCOUNTER — Telehealth: Payer: Self-pay | Admitting: Internal Medicine

## 2021-07-29 ENCOUNTER — Ambulatory Visit: Payer: PPO | Attending: Family Medicine | Admitting: Physical Therapy

## 2021-07-29 DIAGNOSIS — R2689 Other abnormalities of gait and mobility: Secondary | ICD-10-CM | POA: Diagnosis not present

## 2021-07-29 DIAGNOSIS — M6281 Muscle weakness (generalized): Secondary | ICD-10-CM | POA: Diagnosis not present

## 2021-07-29 DIAGNOSIS — R2681 Unsteadiness on feet: Secondary | ICD-10-CM | POA: Diagnosis not present

## 2021-07-29 NOTE — Telephone Encounter (Signed)
Spoke to patient she stated she was concerned she has not received a call to schedule MRI of heart.Stated she has been waiting 3 weeks.Advised I will send a message to pre cert dept.and to Dr.Hilty's RN.

## 2021-07-29 NOTE — Telephone Encounter (Signed)
Patient wanted to know about the insurance to see if they have approved of her having an MRI for her heart. Please call back to discuss. Says that it has been three weeks

## 2021-07-29 NOTE — Telephone Encounter (Signed)
Per staff message - no pre-cert required for cMRI

## 2021-07-30 ENCOUNTER — Encounter: Payer: Self-pay | Admitting: Physical Therapy

## 2021-07-30 NOTE — Therapy (Signed)
[facility] [address] [place], [name], [address] Phone: [phone]   Fax:  [phone]  Physical Therapy Treatment  Patient Details  Name: [name] MRN: [id number] Date of Birth: 04/25/1961 Referring Provider (PT): Loleta Dicker, [place]   Encounter Date: 07/29/2021   PT End of Session - 07/30/21 1939     Visit Number 3    Number of Visits 24    Date for PT Re-Evaluation 10/04/21    Authorization Type HTA medicare    Authorization Time Period 07-11-21 - 10-04-21    Progress Note Due on Visit 10    PT Start Time 1550    PT Stop Time 1635    PT Time Calculation (min) 45 min    Equipment Utilized During Treatment Other (comment)   pool noodle   Activity Tolerance Patient limited by pain;Patient limited by fatigue   limited by increased headache with sit to Rt sidelying   Behavior During Therapy [facility] for tasks assessed/performed             Past Medical History:  Diagnosis Date   Anxiety    Aortic atherosclerosis ([place]) 04/16/2021   Atypical angina (HCC)    Back pain    related to spinal stenosis and disc problem, radiates down left buttocks to leg., weakness occ.   Chest pain    a. 03/2015 Cath: nl cors; b. 03/2021 Cor CTA: Ca2+ = 0. Nl Cors.   Chronic pain syndrome    Dyspnea    GERD (gastroesophageal reflux disease)    Grade I diastolic dysfunction    Headache    Hyperlipidemia    Hypertension    Lumbar post-laminectomy syndrome    LVH (left ventricular hypertrophy) 12/15/2020   a. 11/2020 Echo: EF 65-70%, no rwma, sev asymm LVH with IVSd 1.9 cm. No LVOT obs @ rest. Gr1 DD. Triv MR.   PONV (postoperative nausea and vomiting)    Pulmonary nodules    a. 03/2021 CT Chest: 30mm pulm nodules in bilat lower lobes. F/u 1 yr.   Right foot drop    Syncope    a. 11/2020 Zio: No significant arrhythmias.   Vaginal foreign object    "Uses Femring"    Past Surgical History:  Procedure Laterality  Date   ABDOMINAL HYSTERECTOMY     BIOPSY  12/16/2020   Procedure: BIOPSY;  Surgeon: Ladene Artist, MD;  Location: [place];  Service: Endoscopy;;   CARDIAC CATHETERIZATION N/A 04/18/2015   Procedure: Left Heart Cath and Coronary Angiography;  Surgeon: [name], MD;  Location: [place] CV LAB;  Service: Cardiovascular;  Laterality: N/A;   COLONOSCOPY W/ BIOPSIES AND POLYPECTOMY     ESOPHAGOGASTRODUODENOSCOPY (EGD) WITH PROPOFOL N/A 12/16/2020   Procedure: ESOPHAGOGASTRODUODENOSCOPY (EGD) WITH PROPOFOL;  Surgeon: Ladene Artist, MD;  Location: [facility] ENDOSCOPY;  Service: Endoscopy;  Laterality: N/A;   FOOT SURGERY Bilateral    [place] "bunion,bone spur, tendon" (1) -6'16, (1)-10'16   IR EPIDUROGRAPHY  07/21/2018   LUMBAR LAMINECTOMY/DECOMPRESSION MICRODISCECTOMY Bilateral 12/28/2015   Procedure: MICRO LUMBAR DECOMPRESSION L4 - L5 BILATERALLY;  Surgeon: [name], MD;  Location: [facility];  Service: Orthopedics;  Laterality: Bilateral;   LUMBAR LAMINECTOMY/DECOMPRESSION MICRODISCECTOMY Bilateral 03/04/2018   Procedure: Revision of Microlumbar Decompression Bilateral Lumbar Four-Five;  Surgeon: [name], MD;  Location: [place];  Service: Orthopedics;  Laterality: Bilateral;  90 mins   SAVORY DILATION N/A 12/16/2020   Procedure: SAVORY DILATION;  Surgeon: Ladene Artist, MD;  Location: [facility] ENDOSCOPY;  Service: Endoscopy;  Laterality: N/A;   SPINAL CORD STIMULATOR INSERTION N/A 09/28/2019   Procedure: THORACIC SPINAL CORD STIMULATOR INSERTION;  Surgeon: Melina Schools, MD;  Location: [place];  Service: Orthopedics;  Laterality: N/A;  2.5 hrs   SPINAL CORD STIMULATOR REMOVAL N/A 05/27/2021   Procedure: LUMBAR SPINAL CORD STIMULATOR REMOVAL;  Surgeon: Deetta Perla, MD;  Location: [facility];  Service: Neurosurgery;  Laterality: N/A;   TUBAL LIGATION     WISDOM TOOTH EXTRACTION     WOUND EXPLORATION N/A 03/04/2018   Procedure: EXPLORATION OF LUMBAR DECOMPRESSION WOUND;  Surgeon: [name], MD;  Location: [place];  Service: Orthopedics;  Laterality: N/A;    There were no vitals filed for this visit.   Subjective Assessment - 07/30/21 1937     Subjective Pt presents for initial aquatic therapy session at [facility] during this OP admission    Pertinent History Spinal cord stimulator insertion on 09-28-19; stimulator removal on 05-27-21 with resultant LLE weakness;  decompression and laminectomy on 12/28/15 at L4-L5  with redo decompression surgery on May 9,2019, post-op paraparesis with surgery to remove small hematoma; 07/12/18 fall at work (involved rolling chair) with hospital admission, transfer to [facility] with d/c home 08/03/18; HTN, Bil foot surgery; left ventricular hypertrophy, chronic pain syndrome, dyspnea, hypertrophic cardiomyopathy    Patient Stated Goals "To be able to move left leg like I was able to before I had the surgery on 05-27-21"    Currently in Pain? Yes    Pain Score 3     Pain Location Back    Pain Orientation Lower;Mid    Pain Descriptors / Indicators Aching;Discomfort;Dull    Pain Type Surgical pain    Pain Onset More than a month ago    Pain Frequency Intermittent                       Aquatic therapy at [facility]  - pool temp 94 degrees   Patient seen for aquatic therapy today.  Treatment took place in water 3.5-4 feet deep depending upon activity.  Pt entered and  Exited the pool via chair lift; pt initially attempted to enter pool by step negotiation but had difficulty with descension due to Lt quad eccentric weakness so pt turned around after descending 1st 2 steps to exit pool - bil. Hand rails used.   Pt performed runner's stretch on each leg - 30 sec hold x 1 rep each leg  Pt performed amb. 18' x 8 reps across pool with bil. UE support on pool noodle  Performed side stepping 38m x 4 reps with bil. UE support on pool noodle  Pt performed squats 10 reps bil. LE's with UE support on pool wall  Pt performed marching in place with  UE support 10 reps 2 sets each leg  Pt performed standing hip flexion, abduction, and extension 10 reps each leg with UE support for LE strengthening;  Pt requires aquatic therapy for the unweighting provided by the buoyancy of the water and needs the viscosity for strengthening LE's and trunk musc.; buoyancy of water needed for spinal decompression for increased tolerance for movement with less pain experienced than with land exercises                                PT Short Term Goals - 07/30/21 1945       PT SHORT TERM GOAL #  1   Title Pt will perform sit to stand transfers 5 reps with bil. UE support in </= 26 secs to demo improved LLE strength.    Baseline 3 reps - 32.72 secs with bil. UE support    Time 4    Period Weeks    Status New    Target Date 08/09/21      PT SHORT TERM GOAL #2   Title Pt will improve TUG score from 39.78 secs to </= 34 secs with rollator to demo improved functional mobility.    Baseline 39.78 secs with rollator    Time 4    Period Weeks    Status New    Target Date 08/09/21      PT SHORT TERM GOAL #3   Title Pt will amb. 4" nonstop with use of rollator with SBA for increased endurance and to demo improved LE strength.    Time 4    Period Weeks    Status New    Target Date 08/09/21      PT SHORT TERM GOAL #4   Title Assess step negotiation with use of handrails.    Time 4    Period Weeks    Status New    Target Date 08/09/21      PT SHORT TERM GOAL #5   Title Pt will stand for 30 secs without UE support with SBA for increased independence with ADL's.    Time 4    Period Weeks    Status New    Target Date 08/09/21      PT SHORT TERM GOAL #6   Title Independent in HEP for LLE strengthening and balance exercises.    Time 4    Period Weeks    Status New    Target Date 08/09/21               PT Long Term Goals - 07/30/21 1946       PT LONG TERM GOAL #1   Title Pt will amb. short distances, i.e. 60'  with use of SPC with SBA.    Time 12    Period Weeks    Status New      PT LONG TERM GOAL #2   Title Improve TUG score to </= 26 secs with use of rollator to demo improved functional mobility and reduced fall risk.    Baseline 39.78 secs with rollator    Time 12    Period Weeks    Status New      PT LONG TERM GOAL #3   Title Pt will increase gait velocity from 1.02 ft/sec to >/= 1.8 ft/sec with rollator for increased gait efficiency.    Baseline 32.16 secs = 1.02 ft/sec with rollator on 07-11-21    Time 12    Period Weeks    Status New      PT LONG TERM GOAL #4   Title Pt will stand for at least 10" with UE support prn for increased independence with ADL's.    Time 12    Period Weeks    Status New      PT LONG TERM GOAL #5   Title Pt will be independent in HEP for strengthening & balance exercises.    Time 12    Period Weeks    Status New                   Plan - 07/30/21 1941     Clinical Impression Statement  Pt attempted to enter pool via step negotiation but unable to safely descend steps due to Lt eccentric quad weakness so pt turned around to exit pool after negotiation of first 2 steps and then used chair lift to enter and exit pool.  Pt able to amb. in 4' water depth with UE suppport on pool noodle without c/o pain in low back.  Pt did report fatigue in Lt leg at end of session.  Pt declined exercises in supine position and requested not to use aquatic cuffs in today's session. Cont with POC.    Personal Factors and Comorbidities Past/Current Experience;Comorbidity 2;Time since onset of injury/illness/exacerbation    Comorbidities hypertrophic cardiomyopathy, left ventricular hypertrophy, post laminectomy syndrome, chronic pain syndrome, dyspnea, s/p spinal cord stimulator removal    Examination-Activity Limitations Stand;Locomotion Level;Bend;Squat;Stairs;Transfers;Carry;Reach Overhead    Examination-Participation Restrictions Cleaning;Community Activity;Meal  Prep;Shop;Laundry;Church;Driving    Stability/Clinical Decision Making Evolving/Moderate complexity    Rehab Potential Good    PT Frequency 2x / week    PT Duration 12 weeks    PT Treatment/Interventions ADLs/Self Care Home Management;Neuromuscular re-education;Balance training;Patient/family education;Aquatic Therapy;Scientist, product/process development;Therapeutic exercise;Stair training;Gait training;Orthotic Fit/Training    PT Next Visit Plan static standing for 2" or longer, issue HEP for LLE strengthening    Consulted and Agree with Plan of Care Patient             Patient will benefit from skilled therapeutic intervention in order to improve the following deficits and impairments:  Pain, Difficulty walking, Decreased strength, Decreased activity tolerance, Abnormal gait, Increased muscle spasms, Decreased endurance, Decreased balance, Decreased coordination, Decreased knowledge of use of DME, Decreased mobility, Decreased safety awareness, Impaired tone  Visit Diagnosis: Other abnormalities of gait and mobility  Muscle weakness (generalized)  Unsteadiness on feet     Problem List Patient Active Problem List   Diagnosis Date Noted   Uncontrolled pain 05/27/2021   Syncope 05/13/2021   Depression with anxiety 05/13/2021   Chronic diastolic CHF (congestive heart failure) ([place]) 05/13/2021   Chronic back pain 05/13/2021   Chest tightness 05/13/2021   Lower abdominal pain 05/13/2021   Syncope and collapse 01/01/2021   Abnormal echocardiogram 01/01/2021   BPPV (benign paroxysmal positional vertigo), left 12/26/2020   Odynophagia    Dysphagia 12/15/2020   Postural dizziness with presyncope 12/14/2020   Lumbar post-laminectomy syndrome    Chronic pain 09/28/2019   Lumbar spinal stenosis    Radicular pain    Gait disorder    Difficulty in urination 07/13/2018   Back pain 07/12/2018   Hyperlipidemia 04/16/2018   GAD (generalized anxiety disorder) 04/16/2018   AKI  (acute kidney injury) ([place])    Benign essential HTN    Major depression, recurrent ([place])    Lumbar radiculopathy 03/09/2018   Paraparesis ([place]) 03/09/2018   Essential hypertension    Chronic nonintractable headache    Leukocytosis    Acute blood loss anemia    Postoperative pain    Generalized weakness    Spinal stenosis at L4-L5 level 03/04/2018   Spinal stenosis of lumbar region 12/28/2015   Chest pain 04/16/2015    Fallon Howerter, [place], [place], [place] 07/30/2021, 7:48 PM  [facility] [address] [place], [name], [address] Phone: [phone]   Fax:  [phone]  Name: [name] MRN: [id number] Date of Birth: 11-Dec-1960

## 2021-07-30 NOTE — Telephone Encounter (Signed)
Ok for 1 mg po lorazepam on call for the MRI.  Dr Debara Pickett

## 2021-07-30 NOTE — Telephone Encounter (Signed)
Pt is wanting to know how soon prior to MRI should she complete labs... please advise

## 2021-07-30 NOTE — Telephone Encounter (Signed)
Spoke with patient - she is scheduled for cMRI on 10/31. She was advised to have CBC, BMET done the week before her test.   She also reports she is very claustrophobic and would like Dr. Debara Pickett to call her in something to take before the test.

## 2021-07-31 ENCOUNTER — Encounter: Payer: Self-pay | Admitting: Physical Medicine & Rehabilitation

## 2021-07-31 ENCOUNTER — Encounter: Payer: PPO | Attending: Physical Medicine & Rehabilitation | Admitting: Physical Medicine & Rehabilitation

## 2021-07-31 ENCOUNTER — Other Ambulatory Visit: Payer: Self-pay

## 2021-07-31 VITALS — BP 125/81 | HR 82 | Temp 98.1°F | Ht 65.0 in | Wt 168.0 lb

## 2021-07-31 DIAGNOSIS — M5416 Radiculopathy, lumbar region: Secondary | ICD-10-CM | POA: Diagnosis not present

## 2021-07-31 NOTE — Progress Notes (Signed)
Subjective:    Patient ID: [name], female    DOB: 1961/02/13, 60 y.o.   MRN: [id number]  HPI  This is a follow-up office visit for Mrs. Beed in regards to her chronic back pain and radiculopathy with associated gait disorder.  She had her spinal cord stimulator removed in August.  There were some associated issues with postoperative pain.  She was hospitalized for a few days.  She was also hospitalized briefly for syncope which was later diagnosis dehydration.  She is scheduled for cardiac MRI later this month.   Her surgical sites are healing. She is still having pain from the former battery site. Her pain levels have been reasonable otherwise.  She has not noticed much of a change in the pain rating down either leg since the stimulator came out.  She is using tramadol generally for her pain.  Percocet makes her "feel bad".  She also takes Cymbalta and gabapentin.  Therapy is helping. She's back over at neurorehab again and began pool therapy Monday.  She received her new right AFO. She is struggling getting the t-strap on consistently. She has a low profile carbon fiber afo currently, ipsilon type brace.      Pain Inventory Average Pain 8 Pain Right Now 3 My pain is intermittent, burning, and aching  In the last 24 hours, has pain interfered with the following? General activity 8 Relation with others 10 Enjoyment of life 7 What TIME of day is your pain at its worst? morning , daytime, evening, and night Sleep (in general) Poor  Pain is worse with: walking, bending, standing, and some activites Pain improves with: rest, therapy/exercise, medication, and heat Relief from Meds: 9  Family History  Problem Relation Age of Onset   Heart attack Mother    Lung cancer Father    Cancer Father    Pancreatic cancer Sister    Breast cancer Sister 65   Throat cancer Brother    Multiple myeloma Sister    Breast cancer Sister        diagnosed in her 53's   Heart  attack Sister    Stomach cancer Cousin    Colon cancer Neg Hx    Social History   Socioeconomic History   Marital status: Married    Spouse name: Not on file   Number of children: Not on file   Years of education: Not on file   Highest education level: Not on file  Occupational History   Not on file  Tobacco Use   Smoking status: Never   Smokeless tobacco: Never  Vaping Use   Vaping Use: Never used  Substance and Sexual Activity   Alcohol use: No   Drug use: No   Sexual activity: Yes    Partners: Male    Birth control/protection: Post-menopausal  Other Topics Concern   Not on file  Social History Narrative   Not on file   Social Determinants of Health   Financial Resource Strain: Low Risk    Difficulty of Paying Living Expenses: Not hard at all  Food Insecurity: Not on file  Transportation Needs: No Transportation Needs   Lack of Transportation (Medical): No   Lack of Transportation (Non-Medical): No  Physical Activity: Not on file  Stress: Not on file  Social Connections: Not on file   Past Surgical History:  Procedure Laterality Date   ABDOMINAL HYSTERECTOMY     BIOPSY  12/16/2020   Procedure: BIOPSY;  Surgeon: Ladene Artist,  MD;  Location: [place];  Service: Endoscopy;;   CARDIAC CATHETERIZATION N/A 04/18/2015   Procedure: Left Heart Cath and Coronary Angiography;  Surgeon: [name], MD;  Location: [place] [facility];  Service: Cardiovascular;  Laterality: N/A;   COLONOSCOPY W/ BIOPSIES AND POLYPECTOMY     ESOPHAGOGASTRODUODENOSCOPY (EGD) WITH PROPOFOL N/A 12/16/2020   Procedure: ESOPHAGOGASTRODUODENOSCOPY (EGD) WITH PROPOFOL;  Surgeon: Ladene Artist, MD;  Location[facility];  Service: Endoscopy;  Laterality: N/A;   FOOT SURGERY Bilateral    [place] "bunion,bone spur, tendon" (1) -6'16, (1)-10'16   IR EPIDUROGRAPHY  07/21/2018   LUMBAR LAMINECTOMY/DECOMPRESSION MICRODISCECTOMY Bilateral 12/28/2015   Procedure: MICRO LUMBAR  DECOMPRESSION L4 - L5 BILATERALLY;  Surgeon: [name], MD;  Location: [facility];  Service: Orthopedics;  Laterality: Bilateral;   LUMBAR LAMINECTOMY/DECOMPRESSION MICRODISCECTOMY Bilateral 03/04/2018   Procedure: Revision of Microlumbar Decompression Bilateral Lumbar Four-Five;  Surgeon: [name], MD;  Location: [place];  Service: Orthopedics;  Laterality: Bilateral;  90 mins   SAVORY DILATION N/A 12/16/2020   Procedure: SAVORY DILATION;  Surgeon: Ladene Artist, MD;  Location: [facility] ENDOSCOPY;  Service: Endoscopy;  Laterality: N/A;   SPINAL CORD STIMULATOR INSERTION N/A 09/28/2019   Procedure: THORACIC SPINAL CORD STIMULATOR INSERTION;  Surgeon: Melina Schools, MD;  Location: [place];  Service: Orthopedics;  Laterality: N/A;  2.5 hrs   SPINAL CORD STIMULATOR REMOVAL N/A 05/27/2021   Procedure: LUMBAR SPINAL CORD STIMULATOR REMOVAL;  Surgeon: Deetta Perla, MD;  Location: [facility];  Service: Neurosurgery;  Laterality: N/A;   TUBAL LIGATION     WISDOM TOOTH EXTRACTION     WOUND EXPLORATION N/A 03/04/2018   Procedure: EXPLORATION OF LUMBAR DECOMPRESSION WOUND;  Surgeon: [name], MD;  Location: [place];  Service: Orthopedics;  Laterality: N/A;   Past Surgical History:  Procedure Laterality Date   ABDOMINAL HYSTERECTOMY     BIOPSY  12/16/2020   Procedure: BIOPSY;  Surgeon: Ladene Artist, MD;  Location: [place];  Service: Endoscopy;;   CARDIAC CATHETERIZATION N/A 04/18/2015   Procedure: Left Heart Cath and Coronary Angiography;  Surgeon: [name], MD;  Location: [place] [facility];  Service: Cardiovascular;  Laterality: N/A;   COLONOSCOPY W/ BIOPSIES AND POLYPECTOMY     ESOPHAGOGASTRODUODENOSCOPY (EGD) WITH PROPOFOL N/A 12/16/2020   Procedure: ESOPHAGOGASTRODUODENOSCOPY (EGD) WITH PROPOFOL;  Surgeon: Ladene Artist, MD;  Location: [facility] END[facility]COPY;  Service: Endoscopy;  Laterality: N/A;   FOOT SURGERY Bilateral    [place] "bunion,bone spur, tendon" (1) -6'16, (1)-10'16   IR  EPIDUROGRAPHY  07/21/2018   LUMBAR LAMINECTOMY/DECOMPRESSION MICRODISCECTOMY Bilateral 12/28/2015   Procedure: MICRO LUMBAR DECOMPRESSION L4 - L5 BILATERALLY;  Surgeon: [name], MD;  Location: [facility];  Service: Orthopedics;  Laterality: Bilateral;   LUMBAR LAMINECTOMY/DECOMPRESSION MICRODISCECTOMY Bilateral 03/04/2018   Procedure: Revision of Microlumbar Decompression Bilateral Lumbar Four-Five;  Surgeon: [name], MD;  Location: [place];  Service: Orthopedics;  Laterality: Bilateral;  90 mins   SAVORY DILATION N/A 12/16/2020   Procedure: SAVORY DILATION;  Surgeon: Ladene Artist, MD;  Location: [facility];  Service: Endoscopy;  Laterality: N/A;   SPINAL CORD STIMULATOR INSERTION N/A 09/28/2019   Procedure: THORACIC SPINAL CORD STIMULATOR INSERTION;  Surgeon: Melina Schools, MD;  Location: [place];  Service: Orthopedics;  Laterality: N/A;  2.5 hrs   SPINAL CORD STIMULATOR REMOVAL N/A 05/27/2021   Procedure: LUMBAR SPINAL CORD STIMULATOR REMOVAL;  Surgeon: Deetta Perla, MD;  Location: [facility];  Service: Neurosurgery;  Laterality: N/A;   TUBAL  LIGATION     WISDOM TOOTH EXTRACTION     WOUND EXPLORATION N/A 03/04/2018   Procedure: EXPLORATION OF LUMBAR DECOMPRESSION WOUND;  Surgeon: [name], MD;  Location: [place];  Service: Orthopedics;  Laterality: N/A;   Past Medical History:  Diagnosis Date   Anxiety    Aortic atherosclerosis ([place]) 04/16/2021   Atypical angina (HCC)    Back pain    related to spinal stenosis and disc problem, radiates down left buttocks to leg., weakness occ.   Chest pain    a. 03/2015 Cath: nl cors; b. 03/2021 Cor CTA: Ca2+ = 0. Nl Cors.   Chronic pain syndrome    Dyspnea    GERD (gastroesophageal reflux disease)    Grade I diastolic dysfunction    Headache    Hyperlipidemia    Hypertension    Lumbar post-laminectomy syndrome    LVH (left ventricular hypertrophy) 12/15/2020   a. 11/2020 Echo: EF 65-70%, no rwma, sev asymm LVH with IVSd 1.9 cm. No LVOT obs @  rest. Gr1 DD. Triv MR.   PONV (postoperative nausea and vomiting)    Pulmonary nodules    a. 03/2021 CT Chest: 30m pulm nodules in bilat lower lobes. F/u 1 yr.   Right foot drop    Syncope    a. 11/2020 Zio: No significant arrhythmias.   Vaginal foreign object    "Uses Femring"   BP 125/81   Pulse 82   Temp 98.1 F (36.7 C)   Ht _0  (1.651 m)   Wt 168 lb (76.2 kg)   SpO2 99%   BMI 27.96 kg/m   Opioid Risk Score:   Fall Risk Score:  `1  Depression screen PHQ 2/9  [facility] 04/22/2020 08/17/2018  Decreased Interest 2 0  Down, Depressed, Hopeless 0 0  PHQ - 2 Score 2 0  Altered sleeping 2 -  Tired, decreased energy 2 -  Change in appetite 1 -  Feeling bad or failure about yourself  0 -  Trouble concentrating 2 -  Moving slowly or fidgety/restless 1 -  Suicidal thoughts 0 -  PHQ-9 Score 10 -  Difficult doing work/chores Very difficult -  Some recent data might be hidden    Review of Systems  Musculoskeletal:  Positive for back pain and gait problem.       Spasms  Neurological:  Positive for tremors, weakness and numbness.  All other systems reviewed and are negative.     Objective:   Physical Exam  General: No acute distress HEENT: EOMI, oral membranes moist Cards: reg rate  Chest: normal effort Abdomen: Soft, NT, ND Skin: dry, intact Extremities: no edema Psych: pleasant and appropriate Skin: intact.  Surgical sites are well-healed Neuro: RLE 2/5 to 3/5... LLE 4/5. Sensory loss distally bilaterally.  Musculoskeletal: AFO is fitting appropriately.  She did not have her T-strap with her today.           Assessment & Plan:  1. Post Lumbar Laminectomy / Decompression: Lumbar Radiculopathy:             -gabapentin 4054mTID             -tramadol  for breakthrough pain. No RF needed yet today              -cymbalta 3017maily  -continue outpt therapies. Will re-certify as needed 2. New onset left BPPV-continue outpt therapies 3. Anxiety.  PCP Following and prescribing Alprazolam. Education Provided. Continue to Monitor.              -  resume cymbalta 71m daily 4. Spasms: may continue robaxin 5. Orthotics:             -advised pt to call [facility] about application of T-strap 6. Cardiac: MRI pending this month-.   Fifteen minutes of face to face patient care time were spent during this visit. All questions were encouraged and answered.  Follow up with me in 4 mos .

## 2021-07-31 NOTE — Patient Instructions (Signed)
PLEASE FEEL FREE TO CALL OUR OFFICE WITH ANY PROBLEMS OR QUESTIONS ([phone])      

## 2021-08-01 ENCOUNTER — Ambulatory Visit: Payer: PPO | Admitting: Physical Therapy

## 2021-08-01 ENCOUNTER — Encounter: Payer: Self-pay | Admitting: Physical Therapy

## 2021-08-01 DIAGNOSIS — R2689 Other abnormalities of gait and mobility: Secondary | ICD-10-CM

## 2021-08-01 DIAGNOSIS — M6281 Muscle weakness (generalized): Secondary | ICD-10-CM

## 2021-08-01 DIAGNOSIS — R2681 Unsteadiness on feet: Secondary | ICD-10-CM

## 2021-08-02 ENCOUNTER — Encounter: Payer: Self-pay | Admitting: Physical Therapy

## 2021-08-02 ENCOUNTER — Telehealth: Payer: Self-pay | Admitting: Pharmacist

## 2021-08-02 NOTE — Chronic Care Management (AMB) (Signed)
Chronic Care Management Pharmacy Assistant   Name: [name]  MRN: [id number] DOB: Feb 15, 1961   Reason for Encounter: Disease State / Hypertension Assessment Call    Conditions to be addressed/monitored: HTN  Recent office visits:  07-04-2021 [name], [place] - Patient presented for flu vaccine.  Recent consult visits:  08-01-2021 JIRCVE, LFYBOFB, PT (Rehab) - Patient presented for Other abnormalities of gait and mobility and other concerns. No medication changes.  07-31-2021 Cheryl Staggers, MD (Phys Med) - Patient presented for Lumbar radiculopathy and other concerns. No medication changes.  07-16-2021 [name] (Neurosurg) - Patient presented for follow up from surgery and other concerns. No medication changes.  07-15-2021 [name], PT (Rehab) - Patient presented for Other abnormalities of gait and mobility and other concerns. No medication changes.  07-10-2021 Cheryl Casino, MD (Cardiology) - Patient presented for Hypertrophic cardiomyopathy and other concerns. Prescribed Telmisartan Changed Carvedilol.  Hospital visits:  Medication Reconciliation was completed by comparing discharge summary, patient's EMR and Pharmacy list, and upon discussion with patient.   Presented to [facility] on 05-27-2021 due to uncontrolled pain. Patient was present for 4 days.   New?Medications Started at [facility] Discharge:?? -started  Oxycodone- acetaminophen 7.5-325 PRN for 5 days [place] 8.6-5.0 twice daily   Medication Changes at Hospital Discharge: -Changed  Tramadol 50 mg every 6 hours PRN for 5 days   Medications Discontinued at Hospital Discharge: -Stopped None    Medications that remain the same after Hospital Discharge:??  -All other medications will remain the same.       Medication Reconciliation was completed by comparing discharge summary, patient's EMR and Pharmacy list, and upon discussion with  patient.   Presented to [facility] on 05-13-2021 due to syncope. Patient was present for 31 hours.   New?Medications Started at [facility] Discharge:?? -started  None   Medication Changes at Hospital Discharge: -Changed  Atorvastatin 20 MG take once daily Baclofen 10 MG take one tablet 3 times daily PRN Femring 0.05 MG/24HR use one ring vaginally every 3 months Gabapentin 400 MG  take 3 times daily  Meclizine 25 MG take one tablet two times daily PRN Methocarbamol 500 take one at bedtime PRN   Medications Discontinued at Hospital Discharge: -Stopped None    Medications that remain the same after Hospital Discharge:??  -All other medications will remain the same.       Presented to [facility] on 12-14-2020 due to postural dizziness with presyncope. Patient was present for 6 days discharged on 12-20-2020   New?Medications Started at [facility] Discharge:?? -started  Meclizine 25 MG tablet one tablet twice daily PRN   Medication Changes at Hospital Discharge: -Changed  Carvedilol 12.5 MG one tablet twice daily with a meal   Medications Discontinued at Hospital Discharge: -Stopped None    Medications that remain the same after Hospital Discharge:??  -All other medications will remain the same.      Medications: Outpatient Encounter Medications as of 08/02/2021  Medication Sig   ALPRAZolam (XANAX) 0.5 MG tablet Take 1/2 to 1 tablet (0.25-0.5 mg total) by mouth at bedtime as needed for anxiety.   atorvastatin (LIPITOR) 20 MG tablet TAKE 1 TABLET BY MOUTH ONCE DAILY   baclofen (LIORESAL) 10 MG tablet TAKE 1 TABLET BY MOUTH THREE TIMES DAILY AS NEEDED   carvedilol (COREG) 25 MG tablet Take 2 tablets (50 mg total) by mouth 2 (two) times daily with a meal.   cholecalciferol (  VITAMIN D3) 25 MCG (1000 UNIT) tablet Take 1,000 Units by mouth daily.   diphenhydrAMINE (BENADRYL) 25 MG tablet Take 25 mg by mouth daily as needed for itching. (Patient  not taking: Reported on 07/31/2021)   DULoxetine (CYMBALTA) 60 MG capsule Take 1 capsule (60 mg total) by mouth daily. (Patient not taking: Reported on 07/31/2021)   Estradiol Acetate (FEMRING) 0.05 MG/24HR RING USE ONE RING VAGINALLY EVERY 3 MONTHS AS DIRECTED   gabapentin (NEURONTIN) 400 MG capsule TAKE 1 CAPSULE BY MOUTH 3 TIMES DAILY   meclizine (ANTIVERT) 25 MG tablet TAKE 1 TABLET BY MOUTH 2 TIMES DAILY AS NEEDED FOR DIZZINESS (Patient not taking: Reported on 07/31/2021)   oxyCODONE-acetaminophen (PERCOCET) 7.5-325 MG tablet Take 1 tablet by mouth every 6 (six) hours as needed for severe pain. (Patient not taking: Reported on 07/31/2021)   senna-docusate (SENOKOT-S) 8.6-50 MG tablet Take 1 tablet by mouth 2 (two) times daily. (Patient not taking: Reported on 07/31/2021)   telmisartan (MICARDIS) 40 MG tablet Take 1 tablet (40 mg total) by mouth daily.   traMADol (ULTRAM) 50 MG tablet Take 50 mg by mouth every 6 (six) hours as needed.   No facility-administered encounter medications on file as of 08/02/2021.  Reviewed chart prior to disease state call. Spoke with patient regarding BP  Recent Office Vitals: BP Readings from Last 3 Encounters:  07/31/21 125/81  07/10/21 (!) 145/83  05/31/21 (!) 117/59   Pulse Readings from Last 3 Encounters:  07/31/21 82  07/10/21 79  05/31/21 68    Wt Readings from Last 3 Encounters:  07/31/21 168 lb (76.2 kg)  07/10/21 168 lb (76.2 kg)  05/27/21 169 lb (76.7 kg)     Kidney Function Lab Results  Component Value Date/Time   CREATININE 1.06 (H) 05/29/2021 08:57 AM   CREATININE 0.99 05/28/2021 07:15 AM   CREATININE 0.99 04/01/2019 03:35 PM   GFR 56.92 (L) 12/14/2020 10:10 AM   GFRNONAA >60 05/29/2021 08:57 AM   GFRAA >60 09/23/2019 12:07 PM    BMP Latest Ref Rng & Units 05/29/2021 05/28/2021 05/13/2021  Glucose 70 - 99 mg/dL - 125(H) 107(H)  BUN 6 - 20 mg/dL - 13 15  Creatinine 0.44 - 1.00 mg/dL 1.06(H) 0.99 0.99  BUN/Creat Ratio 9 - 23 - - -   Sodium 135 - 145 mmol/L - 137 136  Potassium 3.5 - 5.1 mmol/L - 3.9 3.6  Chloride 98 - 111 mmol/L - 102 101  CO2 22 - 32 mmol/L - 28 27  Calcium 8.9 - 10.3 mg/dL - 8.8(L) 8.9    Current antihypertensive regimen:  Carvedilol 25 mg 2 tablet twice daily Telmisartan-HCTZ 40-12.5 mg 1 tablet daily How often are you checking your Blood Pressure? Patient reports she is checking infrequently Current home BP readings: 125/81 What recent interventions/DTPs have been made by any provider to improve Blood Pressure control since last CPP Visit: Patient reports she is now talking carvedilol differently she is doing 3 pills in the morning and 2 in the evening instead of one each time per Dr Debara Pickett. She reports she is to have a MRI on the 31 st and he wants her pressure better prior to that. Any recent hospitalizations or ED visits since last visit with CPP? None What diet changes have been made to improve Blood Pressure Control?  Patient reports she is still watching her sodium level and intake with her meals  Adherence Review: Is the patient currently on ACE/ARB medication? Yes Does the patient have >5 day  gap between last estimated fill dates? No    Care Gaps: AWV- Ramond Craver CMA aware to schedule. COVID Vaccines - Overdue Zoster Vaccine - Overdue CCM- 11-27-21 BP- 125/81  Star Rating Drugs: Telmisartan -HCTZ 40-12.5 mg - Last filled 07-02-2021 90 DS at [facility] Atorvastatin (Lipitor) 20 mg - Last filled 06-04-2021 90 DS at [name] Pharmacist Assistant [phone]

## 2021-08-02 NOTE — Therapy (Signed)
[facility] [address] [place], [name], [address] Phone: [phone]   Fax:  [phone]  Physical Therapy Treatment  Patient Details  Name: [name] MRN: [id number] Date of Birth: 1961/10/24 Referring Provider (PT): Loleta Dicker, [place]   Encounter Date: 08/01/2021   PT End of Session - 08/02/21 0951     Visit Number 4    Number of Visits 24    Date for PT Re-Evaluation 10/04/21    Authorization Type HTA medicare    Authorization Time Period 07-11-21 - 10-04-21    Progress Note Due on Visit 10    PT Start Time 1534    PT Stop Time 1621    PT Time Calculation (min) 47 min    Equipment Utilized During Treatment Gait belt   pool noodle   Activity Tolerance Patient limited by fatigue   limited by increased headache with sit to Rt sidelying   Behavior During Therapy [facility] for tasks assessed/performed             Past Medical History:  Diagnosis Date   Anxiety    Aortic atherosclerosis ([place]) 04/16/2021   Atypical angina ([place])    Back pain    related to spinal stenosis and disc problem, radiates down left buttocks to leg., weakness occ.   Chest pain    a. 03/2015 Cath: nl cors; b. 03/2021 Cor CTA: Ca2+ = 0. Nl Cors.   Chronic pain syndrome    Dyspnea    GERD (gastroesophageal reflux disease)    Grade I diastolic dysfunction    Headache    Hyperlipidemia    Hypertension    Lumbar post-laminectomy syndrome    LVH (left ventricular hypertrophy) 12/15/2020   a. 11/2020 Echo: EF 65-70%, no rwma, sev asymm LVH with IVSd 1.9 cm. No LVOT obs @ rest. Gr1 DD. Triv MR.   PONV (postoperative nausea and vomiting)    Pulmonary nodules    a. 03/2021 CT Chest: 65mm pulm nodules in bilat lower lobes. F/u 1 yr.   Right foot drop    Syncope    a. 11/2020 Zio: No significant arrhythmias.   Vaginal foreign object    "Uses Femring"    Past Surgical History:  Procedure Laterality Date   ABDOMINAL HYSTERECTOMY      BIOPSY  12/16/2020   Procedure: BIOPSY;  Surgeon: Ladene Artist, MD;  Location: [place];  Service: Endoscopy;;   CARDIAC CATHETERIZATION N/A 04/18/2015   Procedure: Left Heart Cath and Coronary Angiography;  Surgeon: [name], MD;  Location: [place] [facility];  Service: Cardiovascular;  Laterality: N/A;   COLONOSCOPY W/ BIOPSIES AND POLYPECTOMY     ESOPHAGOGASTRODUODENOSCOPY (EGD) WITH PROPOFOL N/A 12/16/2020   Procedure: ESOPHAGOGASTRODUODENOSCOPY (EGD) WITH PROPOFOL;  Surgeon: Ladene Artist, MD;  Location: [facility] ENDOSCOPY;  Service: Endoscopy;  Laterality: N/A;   FOOT SURGERY Bilateral    [place] "bunion,bone spur, tendon" (1) -6'16, (1)-10'16   IR EPIDUROGRAPHY  07/21/2018   LUMBAR LAMINECTOMY/DECOMPRESSION MICRODISCECTOMY Bilateral 12/28/2015   Procedure: MICRO LUMBAR DECOMPRESSION L4 - L5 BILATERALLY;  Surgeon: [name], MD;  [facility];  Service: Orthopedics;  Laterality: Bilateral;   LUMBAR LAMINECTOMY/DECOMPRESSION MICRODISCECTOMY Bilateral 03/04/2018   Procedure: Revision of Microlumbar Decompression Bilateral Lumbar Four-Five;  Surgeon: [name], MD;  Location: [place];  Service: Orthopedics;  Laterality: Bilateral;  90 mins   SAVORY DILATION N/A 12/16/2020   Procedure: SAVORY DILATION;  Surgeon: Ladene Artist, MD;  Location: [facility]  ENDOSCOPY;  Service: Endoscopy;  Laterality: N/A;   SPINAL CORD STIMULATOR INSERTION N/A 09/28/2019   Procedure: THORACIC SPINAL CORD STIMULATOR INSERTION;  Surgeon: Melina Schools, MD;  Location: [place];  Service: Orthopedics;  Laterality: N/A;  2.5 hrs   SPINAL CORD STIMULATOR REMOVAL N/A 05/27/2021   Procedure: LUMBAR SPINAL CORD STIMULATOR REMOVAL;  Surgeon: Deetta Perla, MD;  Location: [facility];  Service: Neurosurgery;  Laterality: N/A;   TUBAL LIGATION     WISDOM TOOTH EXTRACTION     WOUND EXPLORATION N/A 03/04/2018   Procedure: EXPLORATION OF LUMBAR DECOMPRESSION WOUND;  Surgeon: [name], MD;  Location: [place];   Service: Orthopedics;  Laterality: N/A;    There were no vitals filed for this visit.   Subjective Assessment - 08/01/21 1538     Subjective Pt states she had some pain in hips and low back after the 1st pool session on Monday this week  - rated it 6/10 intensity- but took some medication, lied down and it was relieved; saw Dr. Naaman Plummer on Wed.    Pertinent History Spinal cord stimulator insertion on 09-28-19; stimulator removal on 05-27-21 with resultant LLE weakness;  decompression and laminectomy on 12/28/15 at L4-L5  with redo decompression surgery on May 9,2019, post-op paraparesis with surgery to remove small hematoma; 07/12/18 fall at work (involved rolling chair) with hospital admission, transfer to [place] with d/c home 08/03/18; HTN, Bil foot surgery; left ventricular hypertrophy, chronic pain syndrome, dyspnea, hypertrophic cardiomyopathy    Patient Stated Goals "To be able to move left leg like I was able to before I had the surgery on 05-27-21"    Currently in Pain? No/denies    Pain Onset More than a month ago                               [facility] Adult PT Treatment/Exercise - 08/02/21 0001       Transfers   Transfers Sit to Stand;Stand to Sit    Sit to Stand 6: Modified independent (Device/Increase time)    Stand to Sit 6: Modified independent (Device/Increase time)    Number of Reps Other reps (comment)   2   Comments bil. UE support needed for mat to rollator      Exercises   Exercises Knee/Hip      Knee/Hip Exercises: Standing   Forward Step Up Left;5 reps;2 sets;Hand Hold: 2;Step Height: 4";Step Height: 6"   1 rep to 6" step; 10 reps total to 4" step   Step Down Left;1 set;5 reps;Hand Hold: 2;Step Height: 4"   performed inside // bars   Other Standing Knee Exercises pt stood at counter with bil. UE support - performed stepping forward/back and laterally 5 reps each direction with RLE for closed chain strengthening LLE - bil. UE support used      Knee/Hip  Exercises: Seated   Long Arc Quad Strengthening;Left;2 sets;1 set;10 reps;Weights   2#                      PT Short Term Goals - 08/02/21 0957       PT SHORT TERM GOAL #1   Title Pt will perform sit to stand transfers 5 reps with bil. UE support in </= 26 secs to demo improved LLE strength.    Baseline 3 reps - 32.72 secs with bil. UE support    Time 4    Period Weeks  Status New    Target Date 08/09/21      PT SHORT TERM GOAL #2   Title Pt will improve TUG score from 39.78 secs to </= 34 secs with rollator to demo improved functional mobility.    Baseline 39.78 secs with rollator    Time 4    Period Weeks    Status New    Target Date 08/09/21      PT SHORT TERM GOAL #3   Title Pt will amb. 4" nonstop with use of rollator with SBA for increased endurance and to demo improved LE strength.    Time 4    Period Weeks    Status New    Target Date 08/09/21      PT SHORT TERM GOAL #4   Title Assess step negotiation with use of handrails.    Time 4    Period Weeks    Status New    Target Date 08/09/21      PT SHORT TERM GOAL #5   Title Pt will stand for 30 secs without UE support with SBA for increased independence with ADL's.    Time 4    Period Weeks    Status New    Target Date 08/09/21      PT SHORT TERM GOAL #6   Title Independent in HEP for LLE strengthening and balance exercises.    Time 4    Period Weeks    Status New    Target Date 08/09/21               PT Long Term Goals - 08/02/21 0957       PT LONG TERM GOAL #1   Title Pt will amb. short distances, i.e. 27' with use of SPC with SBA.    Time 12    Period Weeks    Status New      PT LONG TERM GOAL #2   Title Improve TUG score to </= 26 secs with use of rollator to demo improved functional mobility and reduced fall risk.    Baseline 39.78 secs with rollator    Time 12    Period Weeks    Status New      PT LONG TERM GOAL #3   Title Pt will increase gait velocity from 1.02  ft/sec to >/= 1.8 ft/sec with rollator for increased gait efficiency.    Baseline 32.16 secs = 1.02 ft/sec with rollator on 07-11-21    Time 12    Period Weeks    Status New      PT LONG TERM GOAL #4   Title Pt will stand for at least 10" with UE support prn for increased independence with ADL's.    Time 12    Period Weeks    Status New      PT LONG TERM GOAL #5   Title Pt will be independent in HEP for strengthening & balance exercises.    Time 12    Period Weeks    Status New                   Plan - 08/02/21 7824     Clinical Impression Statement Pt had difficulty performing step up exercise onto 6" step with LLE due to weakness in Lt hip flexors and quads.  Pt also has decreased eccentric quad strength LLE with difficulty stepping down to floor with RLE first, resulting in need to ascend and descend steps with LLE.  Pt had  weakness in LLE with standing > 5" inside // bars performing exercises, indicative of decreased muscle endurance in Lt quads.  Pt instructed to perform LAQ with 2# weight for HEP for strengthening.  Cont with POC.    Personal Factors and Comorbidities Past/Current Experience;Comorbidity 2;Time since onset of injury/illness/exacerbation;Sex    Comorbidities hypertrophic cardiomyopathy, left ventricular hypertrophy, post laminectomy syndrome, chronic pain syndrome, dyspnea, s/p spinal cord stimulator removal    Examination-Activity Limitations Stand;Locomotion Level;Bend;Squat;Stairs;Transfers;Carry;Reach Overhead    Examination-Participation Restrictions Cleaning;Community Activity;Meal Prep;Shop;Laundry;Church;Driving    Stability/Clinical Decision Making Evolving/Moderate complexity    Rehab Potential Good    PT Frequency 2x / week    PT Duration 12 weeks    PT Treatment/Interventions ADLs/Self Care Home Management;Neuromuscular re-education;Balance training;Patient/family education;Aquatic Therapy;Scientist, product/process development;Therapeutic  exercise;Stair training;Gait training;Orthotic Fit/Training    PT Next Visit Plan issue HEP for LLE strengthening; cont LLE strengthening, SciFit    Consulted and Agree with Plan of Care Patient             Patient will benefit from skilled therapeutic intervention in order to improve the following deficits and impairments:  Pain, Difficulty walking, Decreased strength, Decreased activity tolerance, Abnormal gait, Increased muscle spasms, Decreased endurance, Decreased balance, Decreased coordination, Decreased knowledge of use of DME, Decreased mobility, Decreased safety awareness, Impaired tone  Visit Diagnosis: Other abnormalities of gait and mobility  Muscle weakness (generalized)  Unsteadiness on feet     Problem List Patient Active Problem List   Diagnosis Date Noted   Uncontrolled pain 05/27/2021   Syncope 05/13/2021   Depression with anxiety 05/13/2021   Chronic diastolic CHF (congestive heart failure) ([place]) 05/13/2021   Chronic back pain 05/13/2021   Chest tightness 05/13/2021   Lower abdominal pain 05/13/2021   Syncope and collapse 01/01/2021   Abnormal echocardiogram 01/01/2021   BPPV (benign paroxysmal positional vertigo), left 12/26/2020   Odynophagia    Dysphagia 12/15/2020   Postural dizziness with presyncope 12/14/2020   Lumbar post-laminectomy syndrome    Chronic pain 09/28/2019   Lumbar spinal stenosis    Radicular pain    Gait disorder    Difficulty in urination 07/13/2018   Back pain 07/12/2018   Hyperlipidemia 04/16/2018   GAD (generalized anxiety disorder) 04/16/2018   AKI (acute kidney injury) ([place])    Benign essential HTN    Major depression, recurrent ([place])    Lumbar radiculopathy 03/09/2018   Paraparesis ([place]) 03/09/2018   Essential hypertension    Chronic nonintractable headache    Leukocytosis    Acute blood loss anemia    Postoperative pain    Generalized weakness    Spinal stenosis at L4-L5 level 03/04/2018   Spinal stenosis of  lumbar region 12/28/2015   Chest pain 04/16/2015    Alda Lea, PT 08/02/2021, 10:07 AM  [facility] [address] [place], [name], [address] Phone: [phone]   Fax:  [phone]  Name: [name] MRN: [id number] Date of Birth: 1961-06-15

## 2021-08-05 ENCOUNTER — Ambulatory Visit: Payer: PPO | Admitting: Physical Therapy

## 2021-08-05 ENCOUNTER — Other Ambulatory Visit: Payer: Self-pay

## 2021-08-05 DIAGNOSIS — R2689 Other abnormalities of gait and mobility: Secondary | ICD-10-CM | POA: Diagnosis not present

## 2021-08-05 DIAGNOSIS — R2681 Unsteadiness on feet: Secondary | ICD-10-CM

## 2021-08-05 DIAGNOSIS — M6281 Muscle weakness (generalized): Secondary | ICD-10-CM

## 2021-08-06 ENCOUNTER — Encounter: Payer: Self-pay | Admitting: Physical Therapy

## 2021-08-06 DIAGNOSIS — L668 Other cicatricial alopecia: Secondary | ICD-10-CM | POA: Diagnosis not present

## 2021-08-06 NOTE — Therapy (Signed)
[facility] [address] [place], [name], [address] Phone: [phone]   Fax:  [phone]  Physical Therapy Treatment  Patient Details  Name: [name] MRN: [id number] Date of Birth: 09/13/1961 Referring Provider (PT): Loleta Dicker, [place]   Encounter Date: 08/05/2021   PT End of Session - 08/06/21 1909     Visit Number 5    Number of Visits 24    Date for PT Re-Evaluation 10/04/21    Authorization Type HTA medicare    Authorization Time Period 07-11-21 - 10-04-21    Progress Note Due on Visit 10    PT Start Time 1545    PT Stop Time 1643    PT Time Calculation (min) 58 min    Equipment Utilized During Treatment Other (comment)   pool noodle (large)   Activity Tolerance Patient limited by pain   limited by increased headache with sit to Rt sidelying   Behavior During Therapy [facility] for tasks assessed/performed             Past Medical History:  Diagnosis Date   Anxiety    Aortic atherosclerosis ([place]) 04/16/2021   Atypical angina ([place])    Back pain    related to spinal stenosis and disc problem, radiates down left buttocks to leg., weakness occ.   Chest pain    a. 03/2015 Cath: nl cors; b. 03/2021 Cor CTA: Ca2+ = 0. Nl Cors.   Chronic pain syndrome    Dyspnea    GERD (gastroesophageal reflux disease)    Grade I diastolic dysfunction    Headache    Hyperlipidemia    Hypertension    Lumbar post-laminectomy syndrome    LVH (left ventricular hypertrophy) 12/15/2020   a. 11/2020 Echo: EF 65-70%, no rwma, sev asymm LVH with IVSd 1.9 cm. No LVOT obs @ rest. Gr1 DD. Triv MR.   PONV (postoperative nausea and vomiting)    Pulmonary nodules    a. 03/2021 CT Chest: 69mm pulm nodules in bilat lower lobes. F/u 1 yr.   Right foot drop    Syncope    a. 11/2020 Zio: No significant arrhythmias.   Vaginal foreign object    "Uses Femring"    Past Surgical History:  Procedure Laterality Date   ABDOMINAL  HYSTERECTOMY     BIOPSY  12/16/2020   Procedure: BIOPSY;  Surgeon: Ladene Artist, MD;  Location: [place];  Service: Endoscopy;;   CARDIAC CATHETERIZATION N/A 04/18/2015   Procedure: Left Heart Cath and Coronary Angiography;  Surgeon: [name], MD;  Location: [place] [facility];  Service: Cardiovascular;  Laterality: N/A;   COLONOSCOPY W/ BIOPSIES AND POLYPECTOMY     ESOPHAGOGASTRODUODENOSCOPY (EGD) WITH PROPOFOL N/A 12/16/2020   Procedure: ESOPHAGOGASTRODUODENOSCOPY (EGD) WITH PROPOFOL;  Surgeon: Ladene Artist, MD;  Location: [facility] ENDOSCOPY;  Service: Endoscopy;  Laterality: N/A;   FOOT SURGERY Bilateral    [place] "bunion,bone spur, tendon" (1) -6'16, (1)-10'16   IR EPIDUROGRAPHY  07/21/2018   LUMBAR LAMINECTOMY/DECOMPRESSION MICRODISCECTOMY Bilateral 12/28/2015   Procedure: MICRO LUMBAR DECOMPRESSION L4 - L5 BILATERALLY;  Surgeon: [name], MD;  Location: [facility];  Service: Orthopedics;  Laterality: Bilateral;   LUMBAR LAMINECTOMY/DECOMPRESSION MICRODISCECTOMY Bilateral 03/04/2018   Procedure: Revision of Microlumbar Decompression Bilateral Lumbar Four-Five;  Surgeon: [name], MD;  Location: [place];  Service: Orthopedics;  Laterality: Bilateral;  90 mins   SAVORY DILATION N/A 12/16/2020   Procedure: SAVORY DILATION;  Surgeon: Ladene Artist, MD;  Location:  [facility] [facility]OSCOPY;  Service: Endoscopy;  Laterality: N/A;   SPINAL CORD STIMULATOR INSERTION N/A 09/28/2019   Procedure: THORACIC SPINAL CORD STIMULATOR INSERTION;  Surgeon: Melina Schools, MD;  Location: [place];  Service: Orthopedics;  Laterality: N/A;  2.5 hrs   SPINAL CORD STIMULATOR REMOVAL N/A 05/27/2021   Procedure: LUMBAR SPINAL CORD STIMULATOR REMOVAL;  Surgeon: Deetta Perla, MD;  Location: [facility];  Service: Neurosurgery;  Laterality: N/A;   TUBAL LIGATION     WISDOM TOOTH EXTRACTION     WOUND EXPLORATION N/A 03/04/2018   Procedure: EXPLORATION OF LUMBAR DECOMPRESSION WOUND;  Surgeon: [name], MD;   Location: [place];  Service: Orthopedics;  Laterality: N/A;    There were no vitals filed for this visit.   Subjective Assessment - 08/06/21 1907     Subjective Pt reports she felt ok after previous PT land session last week    Pertinent History Spinal cord stimulator insertion on 09-28-19; stimulator removal on 05-27-21 with resultant LLE weakness;  decompression and laminectomy on 12/28/15 at L4-L5  with redo decompression surgery on May 9,2019, post-op paraparesis with surgery to remove small hematoma; 07/12/18 fall at work (involved rolling chair) with hospital admission, transfer to [place] with d/c home 08/03/18; HTN, Bil foot surgery; left ventricular hypertrophy, chronic pain syndrome, dyspnea, hypertrophic cardiomyopathy    Patient Stated Goals "To be able to move left leg like I was able to before I had the surgery on 05-27-21"    Currently in Pain? No/denies    Pain Onset More than a month ago                 Aquatic therapy at [facility]  - pool temp 94 degrees   Patient seen for aquatic therapy today.  Treatment took place in water 3.5-4 feet deep depending upon activity.  Pt entered and  Exited the pool via step negotiation with use of bil. Hand rails with min assist  Pt performed runner's stretch on each leg - 30 sec hold x 1 rep each leg  Pt performed amb. 18' x approx. 10 reps across pool with bil. UE support on pool noodle  Performed side stepping 18'  x 4 reps with bil. UE support on pool noodle with min assist for stabilization by PT  Pt performed squats 10 reps bil. LE's with UE support on pool wall (small range)  Pt performed marching in place with UE support 10 reps 2 sets each leg  Pt requires aquatic therapy for the unweighting provided by the buoyancy of the water and needs the viscosity for strengthening LE's and trunk musc.; buoyancy of water needed for spinal decompression for increased tolerance for movement with less pain experienced than with land  exercises                              PT Short Term Goals - 08/06/21 1914       PT SHORT TERM GOAL #1   Title Pt will perform sit to stand transfers 5 reps with bil. UE support in </= 26 secs to demo improved LLE strength.    Baseline 3 reps - 32.72 secs with bil. UE support    Time 4    Period Weeks    Status New    Target Date 08/09/21      PT SHORT TERM GOAL #2   Title Pt will improve TUG score from 39.78 secs to </= 34 secs with rollator  to demo improved functional mobility.    Baseline 39.78 secs with rollator    Time 4    Period Weeks    Status New    Target Date 08/09/21      PT SHORT TERM GOAL #3   Title Pt will amb. 4" nonstop with use of rollator with SBA for increased endurance and to demo improved LE strength.    Time 4    Period Weeks    Status New    Target Date 08/09/21      PT SHORT TERM GOAL #4   Title Assess step negotiation with use of handrails.    Time 4    Period Weeks    Status New    Target Date 08/09/21      PT SHORT TERM GOAL #5   Title Pt will stand for 30 secs without UE support with SBA for increased independence with ADL's.    Time 4    Period Weeks    Status New    Target Date 08/09/21      PT SHORT TERM GOAL #6   Title Independent in HEP for LLE strengthening and balance exercises.    Time 4    Period Weeks    Status New    Target Date 08/09/21               PT Long Term Goals - 08/06/21 1914       PT LONG TERM GOAL #1   Title Pt will amb. short distances, i.e. 55' with use of SPC with SBA.    Time 12    Period Weeks    Status New      PT LONG TERM GOAL #2   Title Improve TUG score to </= 26 secs with use of rollator to demo improved functional mobility and reduced fall risk.    Baseline 39.78 secs with rollator    Time 12    Period Weeks    Status New      PT LONG TERM GOAL #3   Title Pt will increase gait velocity from 1.02 ft/sec to >/= 1.8 ft/sec with rollator for increased gait  efficiency.    Baseline 32.16 secs = 1.02 ft/sec with rollator on 07-11-21    Time 12    Period Weeks    Status New      PT LONG TERM GOAL #4   Title Pt will stand for at least 10" with UE support prn for increased independence with ADL's.    Time 12    Period Weeks    Status New      PT LONG TERM GOAL #5   Title Pt will be independent in HEP for strengthening & balance exercises.    Time 12    Period Weeks    Status New                   Plan - 08/06/21 1911     Clinical Impression Statement Pt reported onset of pain in Rt hip/low back region with marching in 4' water depth with no added resistance on either LE.  Pt was assisted with amb. to pool bench for seated rest break for non-weight bearing position which alleviated most of pain after approx. 5".  Pt able to resume water walking with use of large pool noodle for increased buoyancy for unweighting.  Cont with POC.    Personal Factors and Comorbidities Past/Current Experience;Comorbidity 2;Time since onset of injury/illness/exacerbation;Sex  Comorbidities hypertrophic cardiomyopathy, left ventricular hypertrophy, post laminectomy syndrome, chronic pain syndrome, dyspnea, s/p spinal cord stimulator removal    Examination-Activity Limitations Stand;Locomotion Level;Bend;Squat;Stairs;Transfers;Carry;Reach Overhead    Examination-Participation Restrictions Cleaning;Community Activity;Meal Prep;Shop;Laundry;Church;Driving    Stability/Clinical Decision Making Evolving/Moderate complexity    Rehab Potential Good    PT Frequency 2x / week    PT Duration 12 weeks    PT Treatment/Interventions ADLs/Self Care Home Management;Neuromuscular re-education;Balance training;Patient/family education;Aquatic Therapy;Scientist, product/process development;Therapeutic exercise;Stair training;Gait training;Orthotic Fit/Training    PT Next Visit Plan issue HEP for LLE strengthening; cont LLE strengthening, SciFit    Consulted and Agree with  Plan of Care Patient             Patient will benefit from skilled therapeutic intervention in order to improve the following deficits and impairments:  Pain, Difficulty walking, Decreased strength, Decreased activity tolerance, Abnormal gait, Increased muscle spasms, Decreased endurance, Decreased balance, Decreased coordination, Decreased knowledge of use of DME, Decreased mobility, Decreased safety awareness, Impaired tone  Visit Diagnosis: Other abnormalities of gait and mobility  Muscle weakness (generalized)  Unsteadiness on feet     Problem List Patient Active Problem List   Diagnosis Date Noted   Uncontrolled pain 05/27/2021   Syncope 05/13/2021   Depression with anxiety 05/13/2021   Chronic diastolic CHF (congestive heart failure) ([place]) 05/13/2021   Chronic back pain 05/13/2021   Chest tightness 05/13/2021   Lower abdominal pain 05/13/2021   Syncope and collapse 01/01/2021   Abnormal echocardiogram 01/01/2021   BPPV (benign paroxysmal positional vertigo), left 12/26/2020   Odynophagia    Dysphagia 12/15/2020   Postural dizziness with presyncope 12/14/2020   Lumbar post-laminectomy syndrome    Chronic pain 09/28/2019   Lumbar spinal stenosis    Radicular pain    Gait disorder    Difficulty in urination 07/13/2018   Back pain 07/12/2018   Hyperlipidemia 04/16/2018   GAD (generalized anxiety disorder) 04/16/2018   AKI (acute kidney injury) ([place])    Benign essential HTN    Major depression, recurrent ([place])    Lumbar radiculopathy 03/09/2018   Paraparesis ([place]) 03/09/2018   Essential hypertension    Chronic nonintractable headache    Leukocytosis    Acute blood loss anemia    Postoperative pain    Generalized weakness    Spinal stenosis at L4-L5 level 03/04/2018   Spinal stenosis of lumbar region 12/28/2015   Chest pain 04/16/2015    Damarkus Balis, [place], PT, [place] 08/06/2021, 7:15 PM  [facility] [address] [place], [name], [address] Phone: [phone]   Fax:  [phone]  Name: [name] MRN: [id number] Date of Birth: 1961-08-06

## 2021-08-07 ENCOUNTER — Other Ambulatory Visit (HOSPITAL_COMMUNITY): Payer: Self-pay

## 2021-08-07 MED ORDER — LORAZEPAM 1 MG PO TABS
1.0000 mg | ORAL_TABLET | ORAL | 0 refills | Status: DC
  Filled 2021-08-07: qty 1, 1d supply, fill #0

## 2021-08-07 MED ORDER — LORAZEPAM 1 MG PO TABS: ORAL_TABLET | ORAL | 0 refills | Status: DC

## 2021-08-07 NOTE — Telephone Encounter (Signed)
She can take it 30-60 mins before the procedure, but she will need someone to drive her and take her home.  Dr Lemmie Evens

## 2021-08-07 NOTE — Telephone Encounter (Signed)
Patient aware of med prescribed by MD, as noted below.   Called Rx to [facility]

## 2021-08-08 ENCOUNTER — Other Ambulatory Visit: Payer: Self-pay

## 2021-08-08 ENCOUNTER — Ambulatory Visit: Payer: PPO | Admitting: Physical Therapy

## 2021-08-08 DIAGNOSIS — R2681 Unsteadiness on feet: Secondary | ICD-10-CM

## 2021-08-08 DIAGNOSIS — R2689 Other abnormalities of gait and mobility: Secondary | ICD-10-CM | POA: Diagnosis not present

## 2021-08-08 DIAGNOSIS — M6281 Muscle weakness (generalized): Secondary | ICD-10-CM

## 2021-08-09 ENCOUNTER — Other Ambulatory Visit: Payer: Self-pay | Admitting: Internal Medicine

## 2021-08-09 ENCOUNTER — Encounter: Payer: Self-pay | Admitting: Physical Therapy

## 2021-08-09 DIAGNOSIS — R062 Wheezing: Secondary | ICD-10-CM | POA: Diagnosis not present

## 2021-08-09 DIAGNOSIS — I422 Other hypertrophic cardiomyopathy: Secondary | ICD-10-CM | POA: Diagnosis not present

## 2021-08-09 DIAGNOSIS — I10 Essential (primary) hypertension: Secondary | ICD-10-CM | POA: Diagnosis not present

## 2021-08-09 DIAGNOSIS — R079 Chest pain, unspecified: Secondary | ICD-10-CM | POA: Diagnosis not present

## 2021-08-09 NOTE — Therapy (Signed)
[facility] [address] [place], [name], [address] Phone: [phone]   Fax:  [phone]  Physical Therapy Treatment  Patient Details  Name: [name] MRN: [id number] Date of Birth: 1961-08-18 Referring Provider (PT): Loleta Dicker, [place]   Encounter Date: 08/08/2021   PT End of Session - 08/09/21 1152     Visit Number 6    Number of Visits 24    Date for PT Re-Evaluation 10/04/21    Authorization Type [employer] medicare    Authorization Time Period 07-11-21 - 10-04-21    Progress Note Due on Visit 10    PT Start Time 1532    PT Stop Time 1618    PT Time Calculation (min) 46 min    Equipment Utilized During Treatment Gait belt   pool noodle (large)   Activity Tolerance Patient limited by pain   limited by increased headache with sit to Rt sidelying   Behavior During Therapy [facility] for tasks assessed/performed             Past Medical History:  Diagnosis Date   Anxiety    Aortic atherosclerosis ([place]) 04/16/2021   Atypical angina ([place])    Back pain    related to spinal stenosis and disc problem, radiates down left buttocks to leg., weakness occ.   Chest pain    a. 03/2015 Cath: nl cors; b. 03/2021 Cor CTA: Ca2+ = 0. Nl Cors.   Chronic pain syndrome    Dyspnea    GERD (gastroesophageal reflux disease)    Grade I diastolic dysfunction    Headache    Hyperlipidemia    Hypertension    Lumbar post-laminectomy syndrome    LVH (left ventricular hypertrophy) 12/15/2020   a. 11/2020 Echo: EF 65-70%, no rwma, sev asymm LVH with IVSd 1.9 cm. No LVOT obs @ rest. Gr1 DD. Triv MR.   PONV (postoperative nausea and vomiting)    Pulmonary nodules    a. 03/2021 CT Chest: 36mm pulm nodules in bilat lower lobes. F/u 1 yr.   Right foot drop    Syncope    a. 11/2020 Zio: No significant arrhythmias.   Vaginal foreign object    "Uses Femring"    Past Surgical History:  Procedure Laterality Date   ABDOMINAL  HYSTERECTOMY     BIOPSY  12/16/2020   Procedure: BIOPSY;  Surgeon: Ladene Artist, MD;  Location: [place];  Service: Endoscopy;;   CARDIAC CATHETERIZATION N/A 04/18/2015   Procedure: Left Heart Cath and Coronary Angiography;  Surgeon: [name], MD;  Location: [facility];  Service: Cardiovascular;  Laterality: N/A;   COLONOSCOPY W/ BIOPSIES AND POLYPECTOMY     ESOPHAGOGASTRODUODENOSCOPY (EGD) WITH PROPOFOL N/A 12/16/2020   Procedure: ESOPHAGOGASTRODUODENOSCOPY (EGD) WITH PROPOFOL;  Surgeon: Ladene Artist, MD;  Location: [facility];  Service: Endoscopy;  Laterality: N/A;   FOOT SURGERY Bilateral    [place] "bunion,bone spur, tendon" (1) -6'16, (1)-10'16   IR EPIDUROGRAPHY  07/21/2018   LUMBAR LAMINECTOMY/DECOMPRESSION MICRODISCECTOMY Bilateral 12/28/2015   Procedure: MICRO LUMBAR DECOMPRESSION L4 - L5 BILATERALLY;  Surgeon: [name], MD;  Location: [facility];  Service: Orthopedics;  Laterality: Bilateral;   LUMBAR LAMINECTOMY/DECOMPRESSION MICRODISCECTOMY Bilateral 03/04/2018   Procedure: Revision of Microlumbar Decompression Bilateral Lumbar Four-Five;  Surgeon: [name], MD;  Location: [facility] [facility];  Service: Orthopedics;  Laterality: Bilateral;  90 mins   SAVORY DILATION N/A 12/16/2020   Procedure: SAVORY DILATION;  Surgeon: Ladene Artist, MD;  Location:  [place] [facility];  Service: Endoscopy;  Laterality: N/A;   SPINAL CORD STIMULATOR INSERTION N/A 09/28/2019   Procedure: THORACIC SPINAL CORD STIMULATOR INSERTION;  Surgeon: Melina Schools, MD;  Location: [facility];  Service: Orthopedics;  Laterality: N/A;  2.5 hrs   SPINAL CORD STIMULATOR REMOVAL N/A 05/27/2021   Procedure: LUMBAR SPINAL CORD STIMULATOR REMOVAL;  Surgeon: Deetta Perla, MD;  Location: [facility];  Service: Neurosurgery;  Laterality: N/A;   TUBAL LIGATION     WISDOM TOOTH EXTRACTION     WOUND EXPLORATION N/A 03/04/2018   Procedure: EXPLORATION OF LUMBAR DECOMPRESSION WOUND;  Surgeon: [name], MD;   Location: [name];  Service: Orthopedics;  Laterality: N/A;    There were no vitals filed for this visit.   Subjective Assessment - 08/08/21 1535     Subjective Pt reports she took some pain medication after the pool session on Monday and laid with heating pad on her back and states it felt much better the next day    Pertinent History Spinal cord stimulator insertion on 09-28-19; stimulator removal on 05-27-21 with resultant LLE weakness;  decompression and laminectomy on 12/28/15 at L4-L5  with redo decompression surgery on May 9,2019, post-op paraparesis with surgery to remove small hematoma; 07/12/18 fall at work (involved rolling chair) with hospital admission, transfer to [place] with d/c home 08/03/18; HTN, Bil foot surgery; left ventricular hypertrophy, chronic pain syndrome, dyspnea, hypertrophic cardiomyopathy    Patient Stated Goals "To be able to move left leg like I was able to before I had the surgery on 05-27-21"    Currently in Pain? No/denies    Pain Onset More than a month ago                               [facility] Adult PT Treatment/Exercise - 08/09/21 0001       Transfers   Transfers Sit to Stand;Stand to Sit    Sit to Stand 5: Supervision    Sit to Stand Details (indicate cue type and reason) bil. UE support used/needed    Five time sit to stand comments  28.28    Stand to Sit 5: Supervision      Ambulation/Gait   Ambulation/Gait Yes    Ambulation/Gait Assistance 5: Supervision    Ambulation/Gait Assistance Details pt amb. 3' 49 secs - was assessing goal set for 4" nonstop amb.    Assistive device Rollator    Gait Pattern Step-through pattern;Decreased hip/knee flexion - right;Decreased dorsiflexion - right    Ambulation Surface Indoor;Level    Stairs Yes    Stairs Assistance 4: Min guard    Stair Management Technique Step to pattern;Sideways;Forwards;Two rails   1st rep forward up/down; 2nd rep sideways with descension   Number of Stairs 8    Height of  Stairs 6      Standardized Balance Assessment   Standardized Balance Assessment Timed Up and Go Test      Timed Up and Go Test   TUG Normal TUG    Normal TUG (seconds) 28.12   with rollator     Knee/Hip Exercises: Seated   Long Arc Quad Strengthening;Left;2 sets;1 set;10 reps;Weights   2#   Knee/Hip Flexion LLE & RLE  2# 5 reps            Pt stood unsupported for 30 secs for STG assessment           PT Short Term Goals - 08/08/21  [place] #1   Title Pt will perform sit to stand transfers 5 reps with bil. UE support in </= 26 secs to demo improved LLE strength.    Baseline 3 reps - 32.72 secs with bil. UE support;   1st trial 1.00";  28.28 secs with bil. UE support on 2nd trial    Time 4    Period Weeks    Status On-going    Target Date 09/06/21      PT SHORT TERM GOAL #2   Title Pt will improve TUG score from 39.78 secs to </= 34 secs with rollator to demo improved functional mobility. UPGRADE:  score </= 24.5 secs with rollator    Baseline 39.78 secs with rollator;  28.12 secs with rollator    Time 4    Period Weeks    Status Revised    Target Date 09/06/21      PT SHORT TERM GOAL #3   Title Pt will amb. 4" nonstop with use of rollator with SBA for increased endurance and to demo improved LE strength.    Baseline pt amb. 3" 49 secs with rollator    Time 4    Period Weeks    Status On-going    Target Date 09/06/21      PT SHORT TERM GOAL #4   Title Assess step negotiation with use of handrails.    Time 4    Period Weeks    Status Achieved    Target Date 08/09/21      PT SHORT TERM GOAL #5   Title Pt will stand for 30 secs without UE support with SBA for increased independence with ADL's. UPGRADE - stand for 1.5" without UE support    Time 4    Period Weeks    Status Revised    Target Date 09/06/21      PT SHORT TERM GOAL #6   Title Independent in HEP for LLE strengthening and balance exercises.    Time 4    Period Weeks     Status Achieved    Target Date 08/09/21               PT Long Term Goals - 08/09/21 1206       PT LONG TERM GOAL #1   Title Pt will amb. short distances, i.e. 66' with use of SPC with SBA.    Time 12    Period Weeks    Status New      PT LONG TERM GOAL #2   Title Improve TUG score to </= 26 secs with use of rollator to demo improved functional mobility and reduced fall risk.    Baseline 39.78 secs with rollator    Time 12    Period Weeks    Status New      PT LONG TERM GOAL #3   Title Pt will increase gait velocity from 1.02 ft/sec to >/= 1.8 ft/sec with rollator for increased gait efficiency.    Baseline 32.16 secs = 1.02 ft/sec with rollator on 07-11-21    Time 12    Period Weeks    Status New      PT LONG TERM GOAL #4   Title Pt will stand for at least 10" with UE support prn for increased independence with ADL's.    Time 12    Period Weeks    Status New      PT LONG TERM GOAL #5  Title Pt will be independent in HEP for strengthening & balance exercises.    Time 12    Period Weeks    Status New                   Plan - 08/09/21 1202     Clinical Impression Statement Pt has met STG's #2, 4-6; STG #1 not met but improved as time for 5x sit to stand 28.28 secs, not 26 secs per stated goal.  STG #3 not met but closely approximated as pt amb. 3" 46 secs with rollator - goal set for 4" nonstop amb.  Pt reported back discomfort/pulling sensation with descending steps forward; pt descended steps sideways on 2nd rep and had no c/o back discomfort with this way of step negotiation.  Cont with POC.    Personal Factors and Comorbidities Past/Current Experience;Comorbidity 2;Time since onset of injury/illness/exacerbation;Sex    Comorbidities hypertrophic cardiomyopathy, left ventricular hypertrophy, post laminectomy syndrome, chronic pain syndrome, dyspnea, s/p spinal cord stimulator removal    Examination-Activity Limitations Stand;Locomotion  Level;Bend;Squat;Stairs;Transfers;Carry;Reach Overhead    Examination-Participation Restrictions Cleaning;Community Activity;Meal Prep;Shop;Laundry;Church;Driving    Stability/Clinical Decision Making Evolving/Moderate complexity    Rehab Potential Good    PT Frequency 2x / week    PT Duration 12 weeks    PT Treatment/Interventions ADLs/Self Care Home Management;Neuromuscular re-education;Balance training;Patient/family education;Aquatic Therapy;Scientist, product/process development;Therapeutic exercise;Stair training;Gait training;Orthotic Fit/Training    PT Next Visit Plan issue HEP for LLE strengthening; cont LLE strengthening, SciFit    Consulted and Agree with Plan of Care Patient             Patient will benefit from skilled therapeutic intervention in order to improve the following deficits and impairments:  Pain, Difficulty walking, Decreased strength, Decreased activity tolerance, Abnormal gait, Increased muscle spasms, Decreased endurance, Decreased balance, Decreased coordination, Decreased knowledge of use of DME, Decreased mobility, Decreased safety awareness, Impaired tone  Visit Diagnosis: Other abnormalities of gait and mobility  Muscle weakness (generalized)  Unsteadiness on feet     Problem List Patient Active Problem List   Diagnosis Date Noted   Uncontrolled pain 05/27/2021   Syncope 05/13/2021   Depression with anxiety 05/13/2021   Chronic diastolic CHF (congestive heart failure) ([place]) 05/13/2021   Chronic back pain 05/13/2021   Chest tightness 05/13/2021   Lower abdominal pain 05/13/2021   Syncope and collapse 01/01/2021   Abnormal echocardiogram 01/01/2021   BPPV (benign paroxysmal positional vertigo), left 12/26/2020   Odynophagia    Dysphagia 12/15/2020   Postural dizziness with presyncope 12/14/2020   Lumbar post-laminectomy syndrome    Chronic pain 09/28/2019   Lumbar spinal stenosis    Radicular pain    Gait disorder    Difficulty in  urination 07/13/2018   Back pain 07/12/2018   Hyperlipidemia 04/16/2018   GAD (generalized anxiety disorder) 04/16/2018   AKI (acute kidney injury) ([place])    Benign essential HTN    Major depression, recurrent ([place])    Lumbar radiculopathy 03/09/2018   Paraparesis ([place]) 03/09/2018   Essential hypertension    Chronic nonintractable headache    Leukocytosis    Acute blood loss anemia    Postoperative pain    Generalized weakness    Spinal stenosis at L4-L5 level 03/04/2018   Spinal stenosis of lumbar region 12/28/2015   Chest pain 04/16/2015    Alda Lea, PT 08/09/2021, 12:07 PM  [facility] [address] [place], [name], [address] Phone: [phone]   Fax:  [place]  Name: [name] MRN: [id number] Date of Birth: April 21, 1961

## 2021-08-10 LAB — BASIC METABOLIC PANEL
BUN/Creatinine Ratio: 10 — ABNORMAL LOW (ref 12–28)
BUN: 11 mg/dL (ref 8–27)
CO2: 24 mmol/L (ref 20–29)
Calcium: 9.3 mg/dL (ref 8.7–10.3)
Chloride: 104 mmol/L (ref 96–106)
Creatinine, Ser: 1.13 mg/dL — ABNORMAL HIGH (ref 0.57–1.00)
Glucose: 81 mg/dL (ref 70–99)
Potassium: 4.1 mmol/L (ref 3.5–5.2)
Sodium: 143 mmol/L (ref 134–144)
eGFR: 56 mL/min/{1.73_m2} — ABNORMAL LOW (ref >59)

## 2021-08-19 ENCOUNTER — Other Ambulatory Visit: Payer: Self-pay

## 2021-08-19 ENCOUNTER — Ambulatory Visit: Payer: PPO | Admitting: Physical Therapy

## 2021-08-19 DIAGNOSIS — R2689 Other abnormalities of gait and mobility: Secondary | ICD-10-CM

## 2021-08-19 DIAGNOSIS — R2681 Unsteadiness on feet: Secondary | ICD-10-CM

## 2021-08-19 DIAGNOSIS — M6281 Muscle weakness (generalized): Secondary | ICD-10-CM

## 2021-08-20 ENCOUNTER — Telehealth: Payer: Self-pay | Admitting: Internal Medicine

## 2021-08-20 NOTE — Telephone Encounter (Signed)
Called patient of Dr. Debara Pickett regarding BMET (pre-cMRI)  She reports she refused EMS transport to [facility] recently after she passed out(?)/collapsed.  She wanted to go to [facility] but they would not transport her there.  She does report her BP was elevated 190/80 She took her anxiety med when she got home  She was in [place] when this happened, as her nephew just passed away tragically while in the pumpkin patch with his kids (had hypertrophic CM) - she said he refused surgery   She has some tightness in the chest - but is under a lot of stress   Called patient with BMET results - she has been hydrating well

## 2021-08-20 NOTE — Therapy (Signed)
[facility] [address] [place], [name], [address] Phone: [phone]   Fax:  [phone]  Physical Therapy Treatment  Patient Details  Name: [name] MRN: [id number] Date of Birth: Mar 06, 1961 Referring Provider (PT): Loleta Dicker, [place]   Encounter Date: 08/19/2021   PT End of Session - 08/20/21 1945     Visit Number 7    Number of Visits 24    Date for PT Re-Evaluation 10/04/21    Authorization Type HTA medicare    Authorization Time Period 07-11-21 - 10-04-21    Progress Note Due on Visit 10    PT Start Time 1550    PT Stop Time 1635    PT Time Calculation (min) 45 min    Equipment Utilized During Treatment Other (comment)   pool noodle (large)   Activity Tolerance Patient tolerated treatment well   limited by increased headache with sit to Rt sidelying   Behavior During Therapy [facility] for tasks assessed/performed             Past Medical History:  Diagnosis Date   Anxiety    Aortic atherosclerosis ([place]) 04/16/2021   Atypical angina ([place])    Back pain    related to spinal stenosis and disc problem, radiates down left buttocks to leg., weakness occ.   Chest pain    a. 03/2015 Cath: nl cors; b. 03/2021 Cor CTA: Ca2+ = 0. Nl Cors.   Chronic pain syndrome    Dyspnea    GERD (gastroesophageal reflux disease)    Grade I diastolic dysfunction    Headache    Hyperlipidemia    Hypertension    Lumbar post-laminectomy syndrome    LVH (left ventricular hypertrophy) 12/15/2020   a. 11/2020 Echo: EF 65-70%, no rwma, sev asymm LVH with IVSd 1.9 cm. No LVOT obs @ rest. Gr1 DD. Triv MR.   PONV (postoperative nausea and vomiting)    Pulmonary nodules    a. 03/2021 CT Chest: 39mm pulm nodules in bilat lower lobes. F/u 1 yr.   Right foot drop    Syncope    a. 11/2020 Zio: No significant arrhythmias.   Vaginal foreign object    "Uses Femring"    Past Surgical History:  Procedure Laterality Date    ABDOMINAL HYSTERECTOMY     BIOPSY  12/16/2020   Procedure: BIOPSY;  Surgeon: Ladene Artist, MD;  Location: [facility];  Service: Endoscopy;;   CARDIAC CATHETERIZATION N/A 04/18/2015   Procedure: Left Heart Cath and Coronary Angiography;  Surgeon: [name], MD;  Location: [facility];  Service: Cardiovascular;  Laterality: N/A;   COLONOSCOPY W/ BIOPSIES AND POLYPECTOMY     ESOPHAGOGASTRODUODENOSCOPY (EGD) WITH PROPOFOL N/A 12/16/2020   Procedure: ESOPHAGOGASTRODUODENOSCOPY (EGD) WITH PROPOFOL;  Surgeon: Ladene Artist, MD;  Location: [facility];  Service: Endoscopy;  Laterality: N/A;   FOOT SURGERY Bilateral    [place] "bunion,bone spur, tendon" (1) -6'16, (1)-10'16   IR EPIDUROGRAPHY  07/21/2018   LUMBAR LAMINECTOMY/DECOMPRESSION MICRODISCECTOMY Bilateral 12/28/2015   Procedure: MICRO LUMBAR DECOMPRESSION L4 - L5 BILATERALLY;  Surgeon: [name], MD;  Location: [facility];  Service: Orthopedics;  Laterality: Bilateral;   LUMBAR LAMINECTOMY/DECOMPRESSION MICRODISCECTOMY Bilateral 03/04/2018   Procedure: Revision of Microlumbar Decompression Bilateral Lumbar Four-Five;  Surgeon: [name], MD;  Location: [facility];  Service: Orthopedics;  Laterality: Bilateral;  90 mins   SAVORY DILATION N/A 12/16/2020   Procedure: SAVORY DILATION;  Surgeon: Ladene Artist, MD;  Location:  [facility];  Service: Endoscopy;  Laterality: N/A;   SPINAL CORD STIMULATOR INSERTION N/A 09/28/2019   Procedure: THORACIC SPINAL CORD STIMULATOR INSERTION;  Surgeon: Melina Schools, MD;  Location: [facility];  Service: Orthopedics;  Laterality: N/A;  2.5 hrs   SPINAL CORD STIMULATOR REMOVAL N/A 05/27/2021   Procedure: LUMBAR SPINAL CORD STIMULATOR REMOVAL;  Surgeon: Deetta Perla, MD;  Location: [facility];  Service: Neurosurgery;  Laterality: N/A;   TUBAL LIGATION     WISDOM TOOTH EXTRACTION     WOUND EXPLORATION N/A 03/04/2018   Procedure: EXPLORATION OF LUMBAR DECOMPRESSION WOUND;  Surgeon: [name],  MD;  Location: [facility];  Service: Orthopedics;  Laterality: N/A;    There were no vitals filed for this visit.   Subjective Assessment - 08/20/21 1942     Subjective Pt reports no changes or problems - is waiting on MRI to be scheduled    Pertinent History Spinal cord stimulator insertion on 09-28-19; stimulator removal on 05-27-21 with resultant LLE weakness;  decompression and laminectomy on 12/28/15 at L4-L5  with redo decompression surgery on May 9,2019, post-op paraparesis with surgery to remove small hematoma; 07/12/18 fall at work (involved rolling chair) with hospital admission, transfer to [facility] with d/c home 08/03/18; HTN, Bil foot surgery; left ventricular hypertrophy, chronic pain syndrome, dyspnea, hypertrophic cardiomyopathy    Patient Stated Goals "To be able to move left leg like I was able to before I had the surgery on 05-27-21"    Pain Score 3     Pain Location Back    Pain Orientation Lower;Mid    Pain Descriptors / Indicators Aching;Discomfort    Pain Type Chronic pain    Pain Onset More than a month ago    Pain Frequency Intermittent                 Aquatic therapy at [facility]  - pool temp 94 degrees   Patient seen for aquatic therapy today.  Treatment took place in water 3.5-4 feet deep depending upon activity.  Pt entered and  Exited the pool via step negotiation with use of bil. Hand rails with min assist  Pt performed runner's stretch on each leg - 30 sec hold x 1 rep each leg  Pt performed amb. 18' x approx. 10 reps across pool with bil. UE support on pool noodle  Performed side stepping 18'  x 4 reps with bil. UE support on pool noodle with min assist for stabilization by PT  Pt performed squats 10 reps bil. LE's with UE support on pool wall (small range)  Pt performed standing hip AROM exercises for LLE - small range performed - hip flexion, extension and abduction 10 reps each  Pt requires aquatic therapy for the unweighting provided by the buoyancy of  the water and needs the viscosity for strengthening LE's and trunk musc.; buoyancy of water needed for spinal decompression for increased tolerance for movement with less pain experienced than with land exercises                                   PT Short Term Goals - 08/20/21 1946       PT SHORT TERM GOAL #1   Title Pt will perform sit to stand transfers 5 reps with bil. UE support in </= 26 secs to demo improved LLE strength.    Baseline 3 reps - 32.72 secs with bil. UE support;  1st trial 1.00";  28.28 secs with bil. UE support on 2nd trial    Time 4    Period Weeks    Status On-going    Target Date 09/06/21      PT SHORT TERM GOAL #2   Title Pt will improve TUG score from 39.78 secs to </= 34 secs with rollator to demo improved functional mobility. UPGRADE:  score </= 24.5 secs with rollator    Baseline 39.78 secs with rollator;  28.12 secs with rollator    Time 4    Period Weeks    Status Revised    Target Date 09/06/21      PT SHORT TERM GOAL #3   Title Pt will amb. 4" nonstop with use of rollator with SBA for increased endurance and to demo improved LE strength.    Baseline pt amb. 3" 49 secs with rollator    Time 4    Period Weeks    Status On-going    Target Date 09/06/21      PT SHORT TERM GOAL #4   Title Assess step negotiation with use of handrails.    Time 4    Period Weeks    Status Achieved    Target Date 08/09/21      PT SHORT TERM GOAL #5   Title Pt will stand for 30 secs without UE support with SBA for increased independence with ADL's. UPGRADE - stand for 1.5" without UE support    Time 4    Period Weeks    Status Revised    Target Date 09/06/21      PT SHORT TERM GOAL #6   Title Independent in HEP for LLE strengthening and balance exercises.    Time 4    Period Weeks    Status Achieved    Target Date 08/09/21               PT Long Term Goals - 08/20/21 1946       PT LONG TERM GOAL #1   Title Pt will amb.  short distances, i.e. 35' with use of SPC with SBA.    Time 12    Period Weeks    Status New      PT LONG TERM GOAL #2   Title Improve TUG score to </= 26 secs with use of rollator to demo improved functional mobility and reduced fall risk.    Baseline 39.78 secs with rollator    Time 12    Period Weeks    Status New      PT LONG TERM GOAL #3   Title Pt will increase gait velocity from 1.02 ft/sec to >/= 1.8 ft/sec with rollator for increased gait efficiency.    Baseline 32.16 secs = 1.02 ft/sec with rollator on 07-11-21    Time 12    Period Weeks    Status New      PT LONG TERM GOAL #4   Title Pt will stand for at least 10" with UE support prn for increased independence with ADL's.    Time 12    Period Weeks    Status New      PT LONG TERM GOAL #5   Title Pt will be independent in HEP for strengthening & balance exercises.    Time [place] - 08/20/21 1948  Clinical Impression Statement Aquatic therapy session focused on strengthening and stretching exercises for bil. LE's and water walking with use of noodle for increased floatation for unloading and spinal decompression for reduced pain with weight bearing.  Pt continues to decline use of aquatic cuffs for increased resistance with hip strengthening exercises, reporting increased back pain with increased strain with AROM of bil. LE's; therefore, continue to use viscosity only for resistance for strengthening.  Cont with POC.    Personal Factors and Comorbidities Past/Current Experience;Comorbidity 2;Time since onset of injury/illness/exacerbation;Sex    Comorbidities hypertrophic cardiomyopathy, left ventricular hypertrophy, post laminectomy syndrome, chronic pain syndrome, dyspnea, s/p spinal cord stimulator removal    Examination-Activity Limitations Stand;Locomotion Level;Bend;Squat;Stairs;Transfers;Carry;Reach Overhead    Examination-Participation Restrictions  Cleaning;Community Activity;Meal Prep;Shop;Laundry;Church;Driving    Stability/Clinical Decision Making Evolving/Moderate complexity    Rehab Potential Good    PT Frequency 2x / week    PT Duration 12 weeks    PT Treatment/Interventions ADLs/Self Care Home Management;Neuromuscular re-education;Balance training;Patient/family education;Aquatic Therapy;Scientist, product/process development;Therapeutic exercise;Stair training;Gait training;Orthotic Fit/Training    PT Next Visit Plan issue HEP for LLE strengthening; cont LLE strengthening, SciFit    Consulted and Agree with Plan of Care Patient             Patient will benefit from skilled therapeutic intervention in order to improve the following deficits and impairments:  Pain, Difficulty walking, Decreased strength, Decreased activity tolerance, Abnormal gait, Increased muscle spasms, Decreased endurance, Decreased balance, Decreased coordination, Decreased knowledge of use of DME, Decreased mobility, Decreased safety awareness, Impaired tone  Visit Diagnosis: Other abnormalities of gait and mobility  Muscle weakness (generalized)  Unsteadiness on feet     Problem List Patient Active Problem List   Diagnosis Date Noted   Uncontrolled pain 05/27/2021   Syncope 05/13/2021   Depression with anxiety 05/13/2021   Chronic diastolic CHF (congestive heart failure) ([place]) 05/13/2021   Chronic back pain 05/13/2021   Chest tightness 05/13/2021   Lower abdominal pain 05/13/2021   Syncope and collapse 01/01/2021   Abnormal echocardiogram 01/01/2021   BPPV (benign paroxysmal positional vertigo), left 12/26/2020   Odynophagia    Dysphagia 12/15/2020   Postural dizziness with presyncope 12/14/2020   Lumbar post-laminectomy syndrome    Chronic pain 09/28/2019   Lumbar spinal stenosis    Radicular pain    Gait disorder    Difficulty in urination 07/13/2018   Back pain 07/12/2018   Hyperlipidemia 04/16/2018   GAD (generalized anxiety  disorder) 04/16/2018   AKI (acute kidney injury) ([place])    Benign essential HTN    Major depression, recurrent ([place])    Lumbar radiculopathy 03/09/2018   Paraparesis ([place]) 03/09/2018   Essential hypertension    Chronic nonintractable headache    Leukocytosis    Acute blood loss anemia    Postoperative pain    Generalized weakness    Spinal stenosis at L4-L5 level 03/04/2018   Spinal stenosis of lumbar region 12/28/2015   Chest pain 04/16/2015    Linea Calles, [place], [place], [place] 08/20/2021, 7:52 PM  [facility] [address] [place], [name], [address] Phone: [phone]   Fax:  [phone]  Name: [name] MRN: [id number] Date of Birth: 05-03-1961

## 2021-08-21 ENCOUNTER — Telehealth (INDEPENDENT_AMBULATORY_CARE_PROVIDER_SITE_OTHER): Payer: PPO | Admitting: Family Medicine

## 2021-08-21 ENCOUNTER — Other Ambulatory Visit (HOSPITAL_COMMUNITY): Payer: Self-pay

## 2021-08-21 ENCOUNTER — Encounter: Payer: Self-pay | Admitting: Family Medicine

## 2021-08-21 VITALS — Ht 65.0 in

## 2021-08-21 DIAGNOSIS — I10 Essential (primary) hypertension: Secondary | ICD-10-CM

## 2021-08-21 DIAGNOSIS — F419 Anxiety disorder, unspecified: Secondary | ICD-10-CM | POA: Diagnosis not present

## 2021-08-21 MED ORDER — BUSPIRONE HCL 5 MG PO TABS
5.0000 mg | ORAL_TABLET | Freq: Two times a day (BID) | ORAL | 1 refills | Status: DC
Start: 2021-08-21 — End: 2021-10-04
  Filled 2021-08-21: qty 60, 30d supply, fill #0

## 2021-08-21 NOTE — Progress Notes (Signed)
Virtual Visit via Telephone Note I connected with [name] on 08/21/21 at 11:30 AM EDT by telephone and verified that I am speaking with the correct person using two identifiers.   I discussed the limitations, risks, security and privacy concerns of performing an evaluation and management service by telephone and the availability of in person appointments. I also discussed with the patient that there may be a patient responsible charge related to this service. The patient expressed understanding and agreed to proceed.  Location patient: home Location provider: work office Participants present for the call: patient, provider Patient did not have a visit in the prior 7 days to address this/these issue(s).  Chief Complaint  Patient presents with   Stress   Anxiety   History of Present Illness: [name] is a 60 y.o.female with hx of anxiety,HTN,HFpEF,and chronic back pain c/o worsening anxiety. Her nephew died 3 days ago from MI, he was 59. This has caused a great deal of stress. She had an episode of lightheadedness and "collapsed" with "little" chest tightness when she saw the body of her nephew in bed. EMS evaluated pt,BP was elevated at 190/80, ER evaluation was recommended, she declined.  BP yesterday 146/60. She is on Carvedilol 25 mg bid and Micardis 40 mg daily. Negative for unusual/severe headaches,SOB,orthopnea,PND,or edema.  According to pt,she called his cardiologist's office and was told BP elevation and symptoms were most likely caused by stress and anxiety.She was told to ask for medication she can take when she attends funeral service to help to "calm" her down [place] service will be this weekend. She is feeling better today.  Currently she is on alprazolam 0.5 mg tablet 1 tablet daily. Medication causes drowsiness, so she does not want to take it for her nephew's service, she would like something that does not make her drowsy. She takes  Duloxetine 60 mg daily , which also helps with chronic back pain. Takes Tramadol 50 mg, since back surgery pain has improved and she does not take it very often.  Observations/Objective: Patient sounds cheerful and well on the phone. I do not appreciate any SOB. Speech and thought processing are grossly intact.+ Anxious. Patient reported vitals:Ht 5\' 5"  (1.651 m)   BMI 27.96 kg/m   Assessment and Plan:  1. Essential hypertension Mildly elevated at home, it seems exacerbated by stress/anxiety. For now no changes in Carvedilol or Micardis dose. Continue monitoring BP regularly. Low salt diet to continue.  2. Anxiety disorder, unspecified type Problem is not well controlled. Explained that most meds use prn will cause drowsiness. She is already on Alprazolam, 1/2 tab may be better tolerated. Continue Duloxetine 60 mg daily. Buspar 5 mg bid started today. We discussed some side effects and the risk of interaction with some of her meds.  - busPIRone (BUSPAR) 5 MG tablet; Take 1 tablet (5 mg total) by mouth 2 (two) times daily.  Dispense: 60 tablet; Refill: 1  Follow Up Instructions:  Return if symptoms worsen or fail to improve, for Keep next f/u appt 10/04/21.  I did not refer this patient for an OV in the next 24 hours for this/these issue(s).  I discussed the assessment and treatment plan with the patient. The patient was provided an opportunity to ask questions and all were answered. The patient agreed with the plan and demonstrated an understanding of the instructions.   I provided 19 minutes of non-face-to-face time during this encounter.  Betty [name], MD

## 2021-08-22 ENCOUNTER — Ambulatory Visit: Payer: PPO | Admitting: Physical Therapy

## 2021-08-23 ENCOUNTER — Telehealth (HOSPITAL_COMMUNITY): Payer: Self-pay | Admitting: *Deleted

## 2021-08-23 NOTE — Telephone Encounter (Signed)
Reaching out to patient to offer assistance regarding upcoming cardiac imaging study; pt verbalizes understanding of appt date/time, parking situation and where to check in, and verified current allergies; name and call back number provided for further questions should they arise  Gordy Clement RN [facility] [phone] office [phone] cell  Patient states she will have a driver so she take her ativan for the cardiac MRI.

## 2021-08-26 ENCOUNTER — Ambulatory Visit (HOSPITAL_COMMUNITY)
Admission: RE | Admit: 2021-08-26 | Discharge: 2021-08-26 | Disposition: A | Discharge status: Home / Self Care | Payer: PPO | Source: Ambulatory Visit | Attending: Internal Medicine | Admitting: Internal Medicine

## 2021-08-26 ENCOUNTER — Other Ambulatory Visit (HOSPITAL_COMMUNITY): Payer: Self-pay

## 2021-08-26 ENCOUNTER — Other Ambulatory Visit: Payer: Self-pay

## 2021-08-26 DIAGNOSIS — I422 Other hypertrophic cardiomyopathy: Secondary | ICD-10-CM | POA: Diagnosis not present

## 2021-08-26 MED ORDER — GABAPENTIN 400 MG PO CAPS
ORAL_CAPSULE | ORAL | 3 refills | Status: DC
  Filled 2021-08-26: qty 90, 30d supply, fill #0
  Filled 2021-10-14: qty 90, 30d supply, fill #1
  Filled 2021-12-05: qty 90, 30d supply, fill #2
  Filled 2022-01-20: qty 90, 30d supply, fill #3

## 2021-08-26 MED ORDER — GADOBUTROL 1 MMOL/ML IV SOLN
8.0000 mL | Freq: Once | INTRAVENOUS | Status: AC | PRN
  Administered 2021-08-26: 8 mL via INTRAVENOUS

## 2021-08-28 ENCOUNTER — Telehealth: Payer: Self-pay | Admitting: Internal Medicine

## 2021-08-28 NOTE — Telephone Encounter (Signed)
The patient has been made aware that when Dr. Debara Pickett reads the results then we will reach back out to her to go over them.

## 2021-08-28 NOTE — Telephone Encounter (Signed)
would like to speak with a nurse in regards to MRI results... please advise

## 2021-08-29 ENCOUNTER — Ambulatory Visit: Payer: PPO | Admitting: Physical Therapy

## 2021-08-30 ENCOUNTER — Ambulatory Visit: Payer: PPO

## 2021-08-30 ENCOUNTER — Telehealth: Payer: Self-pay

## 2021-08-30 ENCOUNTER — Encounter (HOSPITAL_BASED_OUTPATIENT_CLINIC_OR_DEPARTMENT_OTHER): Payer: Self-pay

## 2021-08-30 NOTE — Telephone Encounter (Signed)
Please advise 

## 2021-08-30 NOTE — Telephone Encounter (Signed)
Called patient x4 goes to voice mail unable to leave a message Patient may reschdule for next available appointment    L.Vers.LPN

## 2021-09-02 ENCOUNTER — Other Ambulatory Visit: Payer: Self-pay

## 2021-09-02 ENCOUNTER — Ambulatory Visit: Payer: PPO | Attending: Family Medicine | Admitting: Physical Therapy

## 2021-09-02 DIAGNOSIS — R2689 Other abnormalities of gait and mobility: Secondary | ICD-10-CM | POA: Diagnosis not present

## 2021-09-02 DIAGNOSIS — R2681 Unsteadiness on feet: Secondary | ICD-10-CM | POA: Diagnosis not present

## 2021-09-02 DIAGNOSIS — M6281 Muscle weakness (generalized): Secondary | ICD-10-CM | POA: Insufficient documentation

## 2021-09-03 ENCOUNTER — Encounter: Payer: Self-pay | Admitting: Physical Therapy

## 2021-09-03 NOTE — Therapy (Signed)
[facility] [address] [place], [name], [address] Phone: [phone]   Fax:  [phone]  Physical Therapy Treatment  Patient Details  Name: [name] MRN: [id number] Date of Birth: April 15, 1961 Referring Provider (PT): Loleta Dicker, [place]   Encounter Date: 09/02/2021   PT End of Session - 09/03/21 2125     Visit Number 8    Number of Visits 24    Date for PT Re-Evaluation 10/04/21    Authorization Type H[employer] medicare    Authorization Time Period 07-11-21 - 10-04-21    Progress Note Due on Visit 10    PT Start Time 1550    PT Stop Time 1640    PT Time Calculation (min) 50 min    Equipment Utilized During Treatment Other (comment)   pool noodle (large)   Activity Tolerance Patient tolerated treatment well   limited by increased headache with sit to Rt sidelying   Behavior During Therapy [facility] for tasks assessed/performed             Past Medical History:  Diagnosis Date   Anxiety    Aortic atherosclerosis ([place]) 04/16/2021   Atypical angina ([place])    Back pain    related to spinal stenosis and disc problem, radiates down left buttocks to leg., weakness occ.   Chest pain    a. 03/2015 Cath: nl cors; b. 03/2021 Cor CTA: Ca2+ = 0. Nl Cors.   Chronic pain syndrome    Dyspnea    GERD (gastroesophageal reflux disease)    Grade I diastolic dysfunction    Headache    Hyperlipidemia    Hypertension    Lumbar post-laminectomy syndrome    LVH (left ventricular hypertrophy) 12/15/2020   a. 11/2020 Echo: EF 65-70%, no rwma, sev asymm LVH with IVSd 1.9 cm. No LVOT obs @ rest. Gr1 DD. Triv MR.   PONV (postoperative nausea and vomiting)    Pulmonary nodules    a. 03/2021 CT Chest: 25mm pulm nodules in bilat lower lobes. F/u 1 yr.   Right foot drop    Syncope    a. 11/2020 Zio: No significant arrhythmias.   Vaginal foreign object    "Uses Femring"    Past Surgical History:  Procedure Laterality Date    ABDOMINAL HYSTERECTOMY     BIOPSY  12/16/2020   Procedure: BIOPSY;  Surgeon: Ladene Artist, MD;  Location: [place];  Service: Endoscopy;;   CARDIAC CATHETERIZATION N/A 04/18/2015   Procedure: Left Heart Cath and Coronary Angiography;  Surgeon: [name], MD;  Location: [place] [facility];  Service: Cardiovascular;  Laterality: N/A;   COLONOSCOPY W/ BIOPSIES AND POLYPECTOMY     ESOPHAGOGASTRODUODENOSCOPY (EGD) WITH PROPOFOL N/A 12/16/2020   Procedure: ESOPHAGOGASTRODUODENOSCOPY (EGD) WITH PROPOFOL;  Surgeon: Ladene Artist, MD;  Location: [facility] ENDOSCOPY;  Service: Endoscopy;  Laterality: N/A;   FOOT SURGERY Bilateral    Eads "bunion,bone spur, tendon" (1) -6'16, (1)-10'16   IR EPIDUROGRAPHY  07/21/2018   LUMBAR LAMINECTOMY/DECOMPRESSION MICRODISCECTOMY Bilateral 12/28/2015   Procedure: MICRO LUMBAR DECOMPRESSION L4 - L5 BILATERALLY;  Surgeon: [name], MD;  Location: [facility];  Service: Orthopedics;  Laterality: Bilateral;   LUMBAR LAMINECTOMY/DECOMPRESSION MICRODISCECTOMY Bilateral 03/04/2018   Procedure: Revision of Microlumbar Decompression Bilateral Lumbar Four-Five;  Surgeon: [name], MD;  Location: [facility];  Service: Orthopedics;  Laterality: Bilateral;  90 mins   SAVORY DILATION N/A 12/16/2020   Procedure: SAVORY DILATION;  Surgeon: Ladene Artist, MD;  Location:  [place] ENDOSCOPY;  Service: Endoscopy;  Laterality: N/A;   SPINAL CORD STIMULATOR INSERTION N/A 09/28/2019   Procedure: THORACIC SPINAL CORD STIMULATOR INSERTION;  Surgeon: Melina Schools, MD;  Location: [facility];  Service: Orthopedics;  Laterality: N/A;  2.5 hrs   SPINAL CORD STIMULATOR REMOVAL N/A 05/27/2021   Procedure: LUMBAR SPINAL CORD STIMULATOR REMOVAL;  Surgeon: Deetta Perla, MD;  Location: [facility];  Service: Neurosurgery;  Laterality: N/A;   TUBAL LIGATION     WISDOM TOOTH EXTRACTION     WOUND EXPLORATION N/A 03/04/2018   Procedure: EXPLORATION OF LUMBAR DECOMPRESSION WOUND;  Surgeon: [name],  MD;  Location: [facility];  Service: Orthopedics;  Laterality: N/A;    There were no vitals filed for this visit.   Subjective Assessment - 09/03/21 2120     Subjective Pt reports MRI was done last Monday; reports no changes in mobility since previous PT aquatic session 2 weeks ago    Pertinent History Spinal cord stimulator insertion on 09-28-19; stimulator removal on 05-27-21 with resultant LLE weakness;  decompression and laminectomy on 12/28/15 at L4-L5  with redo decompression surgery on May 9,2019, post-op paraparesis with surgery to remove small hematoma; 07/12/18 fall at work (involved rolling chair) with hospital admission, transfer to [place] with d/c home 08/03/18; HTN, Bil foot surgery; left ventricular hypertrophy, chronic pain syndrome, dyspnea, hypertrophic cardiomyopathy    Patient Stated Goals "To be able to move left leg like I was able to before I had the surgery on 05-27-21"    Currently in Pain? No/denies    Pain Onset More than a month ago                    Aquatic therapy at [facility]  - pool temp 92 degrees   Patient seen for aquatic therapy today.  Treatment took place in water 3.5-4 feet deep depending upon activity.  Pt entered and  Exited the pool via step negotiation with use of bil. Hand rails with min assist  Pt performed amb. 18' x approx. 8 reps across pool with bil. UE support on pool noodle  Performed side stepping 18'  x 4 reps with bil. UE support on pool noodle with min assist for stabilization by PT  Pt performed squats 10 reps bil. LE's with UE support on pool wall (small range)  Pt performed standing marching - small range - 10 reps each with UE support on PT's forearms  Step ups onto aquatic step with LUE support on pool wall and with RUE PT's HHA  x 2 reps - stepping up/down from step x 2 reps  Pt requires aquatic therapy for the unweighting provided by the buoyancy of the water and needs the viscosity for strengthening LE's and trunk musc.;  buoyancy of water needed for spinal decompression for increased tolerance for movement with less pain experienced than with land exercises                             PT Short Term Goals - 09/03/21 2129       PT SHORT TERM GOAL #1   Title Pt will perform sit to stand transfers 5 reps with bil. UE support in </= 26 secs to demo improved LLE strength.    Baseline 3 reps - 32.72 secs with bil. UE support;   1st trial 1.00";  28.28 secs with bil. UE support on 2nd trial    Time 4  Period Weeks    Status On-going    Target Date 09/06/21      PT SHORT TERM GOAL #2   Title Pt will improve TUG score from 39.78 secs to </= 34 secs with rollator to demo improved functional mobility. UPGRADE:  score </= 24.5 secs with rollator    Baseline 39.78 secs with rollator;  28.12 secs with rollator    Time 4    Period Weeks    Status Revised    Target Date 09/06/21      PT SHORT TERM GOAL #3   Title Pt will amb. 4" nonstop with use of rollator with SBA for increased endurance and to demo improved LE strength.    Baseline pt amb. 3" 49 secs with rollator    Time 4    Period Weeks    Status On-going    Target Date 09/06/21      PT SHORT TERM GOAL #4   Title Assess step negotiation with use of handrails.    Time 4    Period Weeks    Status Achieved    Target Date 08/09/21      PT SHORT TERM GOAL #5   Title Pt will stand for 30 secs without UE support with SBA for increased independence with ADL's. UPGRADE - stand for 1.5" without UE support    Time 4    Period Weeks    Status Revised    Target Date 09/06/21      PT SHORT TERM GOAL #6   Title Independent in HEP for LLE strengthening and balance exercises.    Time 4    Period Weeks    Status Achieved    Target Date 08/09/21               PT Long Term Goals - 09/03/21 2129       PT LONG TERM GOAL #1   Title Pt will amb. short distances, i.e. 55' with use of SPC with SBA.    Time 12    Period Weeks     Status New      PT LONG TERM GOAL #2   Title Improve TUG score to </= 26 secs with use of rollator to demo improved functional mobility and reduced fall risk.    Baseline 39.78 secs with rollator    Time 12    Period Weeks    Status New      PT LONG TERM GOAL #3   Title Pt will increase gait velocity from 1.02 ft/sec to >/= 1.8 ft/sec with rollator for increased gait efficiency.    Baseline 32.16 secs = 1.02 ft/sec with rollator on 07-11-21    Time 12    Period Weeks    Status New      PT LONG TERM GOAL #4   Title Pt will stand for at least 10" with UE support prn for increased independence with ADL's.    Time 12    Period Weeks    Status New      PT LONG TERM GOAL #5   Title Pt will be independent in HEP for strengthening & balance exercises.    Time 12    Period Weeks    Status New                   Plan - 09/03/21 2126     Clinical Impression Statement Pt performed water walking in various directions for bil. LE strengthening and balance training.  Pt able to perform marching small range due to c/o back pain/strain with large hip flexion movements with marching.  Pt performed step ups onto aquatic step in 3.8' water depth with UE support on pool wall with min HHA x 2 reps (first time this exercise has been performed in pool). Cont with POC.    Personal Factors and Comorbidities Past/Current Experience;Comorbidity 2;Time since onset of injury/illness/exacerbation;Sex    Comorbidities hypertrophic cardiomyopathy, left ventricular hypertrophy, post laminectomy syndrome, chronic pain syndrome, dyspnea, s/p spinal cord stimulator removal    Examination-Activity Limitations Stand;Locomotion Level;Bend;Squat;Stairs;Transfers;Carry;Reach Overhead    Examination-Participation Restrictions Cleaning;Community Activity;Meal Prep;Shop;Laundry;Church;Driving    Stability/Clinical Decision Making Evolving/Moderate complexity    Rehab Potential Good    PT Frequency 2x / week    PT  Duration 12 weeks    PT Treatment/Interventions ADLs/Self Care Home Management;Neuromuscular re-education;Balance training;Patient/family education;Aquatic Therapy;Scientist, product/process development;Therapeutic exercise;Stair training;Gait training;Orthotic Fit/Training    PT Next Visit Plan issue HEP for LLE strengthening; cont LLE strengthening, SciFit    Consulted and Agree with Plan of Care Patient             Patient will benefit from skilled therapeutic intervention in order to improve the following deficits and impairments:  Pain, Difficulty walking, Decreased strength, Decreased activity tolerance, Abnormal gait, Increased muscle spasms, Decreased endurance, Decreased balance, Decreased coordination, Decreased knowledge of use of DME, Decreased mobility, Decreased safety awareness, Impaired tone  Visit Diagnosis: Other abnormalities of gait and mobility  Muscle weakness (generalized)  Unsteadiness on feet     Problem List Patient Active Problem List   Diagnosis Date Noted   Uncontrolled pain 05/27/2021   Syncope 05/13/2021   Depression with anxiety 05/13/2021   Chronic diastolic CHF (congestive heart failure) ([place]) 05/13/2021   Chronic back pain 05/13/2021   Chest tightness 05/13/2021   Lower abdominal pain 05/13/2021   Syncope and collapse 01/01/2021   Abnormal echocardiogram 01/01/2021   BPPV (benign paroxysmal positional vertigo), left 12/26/2020   Odynophagia    Dysphagia 12/15/2020   Postural dizziness with presyncope 12/14/2020   Lumbar post-laminectomy syndrome    Chronic pain 09/28/2019   Lumbar spinal stenosis    Radicular pain    Gait disorder    Difficulty in urination 07/13/2018   Back pain 07/12/2018   Hyperlipidemia 04/16/2018   GAD (generalized anxiety disorder) 04/16/2018   AKI (acute kidney injury) ([place])    Benign essential HTN    Major depression, recurrent ([place])    Lumbar radiculopathy 03/09/2018   Paraparesis ([place]) 03/09/2018    Essential hypertension    Chronic nonintractable headache    Leukocytosis    Acute blood loss anemia    Postoperative pain    Generalized weakness    Spinal stenosis at L4-L5 level 03/04/2018   Spinal stenosis of lumbar region 12/28/2015   Chest pain 04/16/2015    Alda Lea, PT 09/03/2021, 9:31 PM  [facility] [address] [place], [name], [address] Phone: [phone]   Fax:  [phone]  Name: [name] MRN: [id number] Date of Birth: May 24, 1961

## 2021-09-11 ENCOUNTER — Other Ambulatory Visit (HOSPITAL_COMMUNITY): Payer: Self-pay

## 2021-09-12 ENCOUNTER — Other Ambulatory Visit: Payer: Self-pay

## 2021-09-12 ENCOUNTER — Other Ambulatory Visit (HOSPITAL_COMMUNITY): Payer: Self-pay

## 2021-09-12 ENCOUNTER — Encounter: Payer: Self-pay | Admitting: Physical Therapy

## 2021-09-12 ENCOUNTER — Ambulatory Visit: Payer: PPO | Admitting: Physical Therapy

## 2021-09-12 ENCOUNTER — Ambulatory Visit (INDEPENDENT_AMBULATORY_CARE_PROVIDER_SITE_OTHER): Payer: PPO

## 2021-09-12 VITALS — BP 130/80 | Ht 65.0 in | Wt 168.0 lb

## 2021-09-12 DIAGNOSIS — R2689 Other abnormalities of gait and mobility: Secondary | ICD-10-CM | POA: Diagnosis not present

## 2021-09-12 DIAGNOSIS — Z0000 Encounter for general adult medical examination without abnormal findings: Secondary | ICD-10-CM | POA: Diagnosis not present

## 2021-09-12 DIAGNOSIS — R2681 Unsteadiness on feet: Secondary | ICD-10-CM

## 2021-09-12 DIAGNOSIS — M6281 Muscle weakness (generalized): Secondary | ICD-10-CM

## 2021-09-12 NOTE — Therapy (Signed)
[facility] [address] [place], [name], [address] Phone: [phone]   Fax:  [phone]  Physical Therapy Treatment  Patient Details  Name: [name] MRN: [id number] Date of Birth: Nov 28, 1960 Referring Provider (PT): Loleta Dicker, [place]   Encounter Date: 09/12/2021   PT End of Session - 09/12/21 2158     Visit Number 9    Number of Visits 24    Date for PT Re-Evaluation 10/04/21    Authorization Type [employer] medicare    Authorization Time Period 07-11-21 - 10-04-21    Progress Note Due on Visit 10    PT Start Time 1535    PT Stop Time 1617    PT Time Calculation (min) 42 min    Equipment Utilized During Treatment --   pool noodle (large)   Activity Tolerance Patient tolerated treatment well   limited by increased headache with sit to Rt sidelying   Behavior During Therapy [facility] for tasks assessed/performed             Past Medical History:  Diagnosis Date   Anxiety    Aortic atherosclerosis ([place]) 04/16/2021   Atypical angina ([place])    Back pain    related to spinal stenosis and disc problem, radiates down left buttocks to leg., weakness occ.   Chest pain    a. 03/2015 Cath: nl cors; b. 03/2021 Cor CTA: Ca2+ = 0. Nl Cors.   Chronic pain syndrome    Dyspnea    GERD (gastroesophageal reflux disease)    Grade I diastolic dysfunction    Headache    Hyperlipidemia    Hypertension    Lumbar post-laminectomy syndrome    LVH (left ventricular hypertrophy) 12/15/2020   a. 11/2020 Echo: EF 65-70%, no rwma, sev asymm LVH with IVSd 1.9 cm. No LVOT obs @ rest. Gr1 DD. Triv MR.   PONV (postoperative nausea and vomiting)    Pulmonary nodules    a. 03/2021 CT Chest: 33mm pulm nodules in bilat lower lobes. F/u 1 yr.   Right foot drop    Syncope    a. 11/2020 Zio: No significant arrhythmias.   Vaginal foreign object    "Uses Femring"    Past Surgical History:  Procedure Laterality Date   ABDOMINAL  HYSTERECTOMY     BIOPSY  12/16/2020   Procedure: BIOPSY;  Surgeon: Ladene Artist, MD;  Location: [place];  Service: Endoscopy;;   CARDIAC CATHETERIZATION N/A 04/18/2015   Procedure: Left Heart Cath and Coronary Angiography;  Surgeon: [name], MD;  Location: [facility];  Service: Cardiovascular;  Laterality: N/A;   COLONOSCOPY W/ BIOPSIES AND POLYPECTOMY     ESOPHAGOGASTRODUODENOSCOPY (EGD) WITH PROPOFOL N/A 12/16/2020   Procedure: ESOPHAGOGASTRODUODENOSCOPY (EGD) WITH PROPOFOL;  Surgeon: Ladene Artist, MD;  Location: [facility] ENDOSCOPY;  Service: Endoscopy;  Laterality: N/A;   FOOT SURGERY Bilateral    [place] "bunion,bone spur, tendon" (1) -6'16, (1)-10'16   IR EPIDUROGRAPHY  07/21/2018   LUMBAR LAMINECTOMY/DECOMPRESSION MICRODISCECTOMY Bilateral 12/28/2015   Procedure: MICRO LUMBAR DECOMPRESSION L4 - L5 BILATERALLY;  Surgeon: [name], MD;  Location: [facility];  Service: Orthopedics;  Laterality: Bilateral;   LUMBAR LAMINECTOMY/DECOMPRESSION MICRODISCECTOMY Bilateral 03/04/2018   Procedure: Revision of Microlumbar Decompression Bilateral Lumbar Four-Five;  Surgeon: [name], MD;  Location: [place];  Service: Orthopedics;  Laterality: Bilateral;  90 mins   SAVORY DILATION N/A 12/16/2020   Procedure: SAVORY DILATION;  Surgeon: Ladene Artist, MD;  Location[facility]  ENDOSCOPY;  Service: Endoscopy;  Laterality: N/A;   SPINAL CORD STIMULATOR INSERTION N/A 09/28/2019   Procedure: THORACIC SPINAL CORD STIMULATOR INSERTION;  Surgeon: Melina Schools, MD;  Location: [facility];  Service: Orthopedics;  Laterality: N/A;  2.5 hrs   SPINAL CORD STIMULATOR REMOVAL N/A 05/27/2021   Procedure: LUMBAR SPINAL CORD STIMULATOR REMOVAL;  Surgeon: Deetta Perla, MD;  Location: [facility];  Service: Neurosurgery;  Laterality: N/A;   TUBAL LIGATION     WISDOM TOOTH EXTRACTION     WOUND EXPLORATION N/A 03/04/2018   Procedure: EXPLORATION OF LUMBAR DECOMPRESSION WOUND;  Surgeon: [name], MD;   Location: [facility];  Service: Orthopedics;  Laterality: N/A;    There were no vitals filed for this visit.   Subjective Assessment - 09/12/21 1530     Subjective Pt reports she is doing OK - wants to only do pool therapy after today's session in clinic    Pertinent History Spinal cord stimulator insertion on 09-28-19; stimulator removal on 05-27-21 with resultant LLE weakness;  decompression and laminectomy on 12/28/15 at L4-L5  with redo decompression surgery on May 9,2019, post-op paraparesis with surgery to remove small hematoma; 07/12/18 fall at work (involved rolling chair) with hospital admission, transfer to [place] with d/c home 08/03/18; HTN, Bil foot surgery; left ventricular hypertrophy, chronic pain syndrome, dyspnea, hypertrophic cardiomyopathy    Patient Stated Goals "To be able to move left leg like I was able to before I had the surgery on 05-27-21"    Currently in Pain? Yes    Pain Score 3     Pain Location Back    Pain Orientation Lower;Mid    Pain Descriptors / Indicators Aching;Discomfort;Dull    Pain Type Chronic pain    Pain Onset More than a month ago    Pain Frequency Constant                               [facility] Adult PT Treatment/Exercise - 09/12/21 0001       Transfers   Transfers Sit to Stand;Stand to Sit    Sit to Stand 5: Supervision    Sit to Stand Details (indicate cue type and reason) bil. UE support used      Ambulation/Gait   Ambulation/Gait Yes    Ambulation/Gait Assistance 5: Supervision    Assistive device Rollator    Gait Pattern Step-through pattern;Decreased hip/knee flexion - right;Decreased dorsiflexion - right    Ambulation Surface Level;Indoor    Stairs Yes    Stairs Assistance 4: Min guard    Stair Management Technique Step to pattern;Sideways;Forwards;Two rails   1st rep forward up/down; 2nd rep sideways with descension   Number of Stairs 8    Height of Stairs 6      Exercises   Exercises Knee/Hip      Knee/Hip Exercises:  Stretches   Passive Hamstring Stretch Left;Other (comment)   10 reps with contract relax 2 sec hold     Knee/Hip Exercises: Aerobic   Recumbent Bike Scifit level 2.0 x 10" with bil UE's and LE's      Knee/Hip Exercises: Standing   Knee Flexion AROM;Left;1 set;10 reps   no weight   Forward Step Up Left;1 set;10 reps    Other Standing Knee Exercises pt stood at mat holding back of chair with bil. UE support - performed stepping forward/back and laterally 5 reps each direction with RLE for closed chain strengthening LLE - bil. UE  support used      Knee/Hip Exercises: Seated   Long Arc Quad Strengthening;Left;2 sets;1 set;10 reps;Weights   2#   Knee/Hip Flexion LLE - 10 reps - no weight                       PT Short Term Goals - 09/12/21 2159       PT SHORT TERM GOAL #1   Title Pt will perform sit to stand transfers 5 reps with bil. UE support in </= 26 secs to demo improved LLE strength.    Baseline 3 reps - 32.72 secs with bil. UE support;   1st trial 1.00";  28.28 secs with bil. UE support on 2nd trial    Time 4    Period Weeks    Status On-going    Target Date 09/06/21      PT SHORT TERM GOAL #2   Title Pt will improve TUG score from 39.78 secs to </= 34 secs with rollator to demo improved functional mobility. UPGRADE:  score </= 24.5 secs with rollator    Baseline 39.78 secs with rollator;  28.12 secs with rollator    Time 4    Period Weeks    Status Revised    Target Date 09/06/21      PT SHORT TERM GOAL #3   Title Pt will amb. 4" nonstop with use of rollator with SBA for increased endurance and to demo improved LE strength.    Baseline pt amb. 3" 49 secs with rollator    Time 4    Period Weeks    Status On-going    Target Date 09/06/21      PT SHORT TERM GOAL #4   Title Assess step negotiation with use of handrails.    Time 4    Period Weeks    Status Achieved    Target Date 08/09/21      PT SHORT TERM GOAL #5   Title Pt will stand for 30 secs  without UE support with SBA for increased independence with ADL's. UPGRADE - stand for 1.5" without UE support    Time 4    Period Weeks    Status Revised    Target Date 09/06/21      PT SHORT TERM GOAL #6   Title Independent in HEP for LLE strengthening and balance exercises.    Time 4    Period Weeks    Status Achieved    Target Date 08/09/21               PT Long Term Goals - 09/12/21 2159       PT LONG TERM GOAL #1   Title Pt will amb. short distances, i.e. 49' with use of SPC with SBA.    Time 12    Period Weeks    Status New      PT LONG TERM GOAL #2   Title Improve TUG score to </= 26 secs with use of rollator to demo improved functional mobility and reduced fall risk.    Baseline 39.78 secs with rollator    Time 12    Period Weeks    Status New      PT LONG TERM GOAL #3   Title Pt will increase gait velocity from 1.02 ft/sec to >/= 1.8 ft/sec with rollator for increased gait efficiency.    Baseline 32.16 secs = 1.02 ft/sec with rollator on 07-11-21    Time 12  Period Weeks    Status New      PT LONG TERM GOAL #4   Title Pt will stand for at least 10" with UE support prn for increased independence with ADL's.    Time 12    Period Weeks    Status New      PT LONG TERM GOAL #5   Title Pt will be independent in HEP for strengthening & balance exercises.    Time 12    Period Weeks    Status New                   Plan - 09/12/21 2200     Clinical Impression Statement Pt is progressing towards goals; able to perform PRE's with 2# weight for LAQ's; pt able to stand on LLE and tap 4" step with RLE with mod difficulty due to weakness in Rt hip flexors.  Pt reported LLE feeling fatigued after riding SciFit so gait training inside // bars was deferred.  Cont with POC.    Personal Factors and Comorbidities Past/Current Experience;Comorbidity 2;Time since onset of injury/illness/exacerbation;Sex    Comorbidities hypertrophic cardiomyopathy, left  ventricular hypertrophy, post laminectomy syndrome, chronic pain syndrome, dyspnea, s/p spinal cord stimulator removal    Examination-Activity Limitations Stand;Locomotion Level;Bend;Squat;Stairs;Transfers;Carry;Reach Overhead    Examination-Participation Restrictions Cleaning;Community Activity;Meal Prep;Shop;Laundry;Church;Driving    Stability/Clinical Decision Making Evolving/Moderate complexity    Rehab Potential Good    PT Frequency 2x / week    PT Duration 12 weeks    PT Treatment/Interventions ADLs/Self Care Home Management;Neuromuscular re-education;Balance training;Patient/family education;Aquatic Therapy;Scientist, product/process development;Therapeutic exercise;Stair training;Gait training;Orthotic Fit/Training    PT Next Visit Plan issue HEP for LLE strengthening; cont LLE strengthening, SciFit    Consulted and Agree with Plan of Care Patient             Patient will benefit from skilled therapeutic intervention in order to improve the following deficits and impairments:  Pain, Difficulty walking, Decreased strength, Decreased activity tolerance, Abnormal gait, Increased muscle spasms, Decreased endurance, Decreased balance, Decreased coordination, Decreased knowledge of use of DME, Decreased mobility, Decreased safety awareness, Impaired tone  Visit Diagnosis: Other abnormalities of gait and mobility  Muscle weakness (generalized)  Unsteadiness on feet     Problem List Patient Active Problem List   Diagnosis Date Noted   Uncontrolled pain 05/27/2021   Syncope 05/13/2021   Depression with anxiety 05/13/2021   Chronic diastolic CHF (congestive heart failure) ([place]) 05/13/2021   Chronic back pain 05/13/2021   Chest tightness 05/13/2021   Lower abdominal pain 05/13/2021   Syncope and collapse 01/01/2021   Abnormal echocardiogram 01/01/2021   BPPV (benign paroxysmal positional vertigo), left 12/26/2020   Odynophagia    Dysphagia 12/15/2020   Postural dizziness with  presyncope 12/14/2020   Lumbar post-laminectomy syndrome    Chronic pain 09/28/2019   Lumbar spinal stenosis    Radicular pain    Gait disorder    Difficulty in urination 07/13/2018   Back pain 07/12/2018   Hyperlipidemia 04/16/2018   GAD (generalized anxiety disorder) 04/16/2018   AKI (acute kidney injury) ([place])    Benign essential HTN    Major depression, recurrent ([facility])    Lumbar radiculopathy 03/09/2018   Paraparesis ([place]) 03/09/2018   Essential hypertension    Chronic nonintractable headache    Leukocytosis    Acute blood loss anemia    Postoperative pain    Generalized weakness    Spinal stenosis at L4-L5 level 03/04/2018   Spinal stenosis of lumbar  region 12/28/2015   Chest pain 04/16/2015    Alda Lea, PT 09/12/2021, 10:04 PM  [facility] [address] [place], [name], [address] Phone: [phone]   Fax:  [phone]  Name: Jesilyn Easom MRN: [id number] Date of Birth: August 29, 1961

## 2021-09-12 NOTE — Patient Instructions (Addendum)
[name] , Thank you for taking time to come for your Medicare Wellness Visit. I appreciate your ongoing commitment to your health goals. Please review the following plan we discussed and let me know if I can assist you in the future.   These are the goals we discussed:  Goals      Increase physical activity     Patient wants to increase ability to walk more.        This is a list of the screening recommended for you and due dates:  Health Maintenance  Topic Date Due   COVID-19 Vaccine (5 - Booster for Pfizer series) 04/12/2021   Zoster (Shingles) Vaccine (2 of 2) 10/22/2021   Colon Cancer Screening  09/04/2022   Mammogram  06/29/2023   Tetanus Vaccine  06/09/2024   Pneumococcal Vaccination  Completed   Flu Shot  Completed   Hepatitis C Screening: USPSTF Recommendation to screen - Ages 18-79 yo.  Completed   HIV Screening  Completed   HPV Vaccine  Aged Out   Pap Smear  Discontinued   Advanced directives: No  Conditions/risks identified: None  Next appointment: Follow up in one year for your annual wellness visit   Preventive Care 65 Years and Older, Female Preventive care refers to lifestyle choices and visits with your health care provider that can promote health and wellness. What does preventive care include? A yearly physical exam. This is also called an annual well check. Dental exams once or twice a year. Routine eye exams. Ask your health care provider how often you should have your eyes checked. Personal lifestyle choices, including: Daily care of your teeth and gums. Regular physical activity. Eating a healthy diet. Avoiding tobacco and drug use. Limiting alcohol use. Practicing safe sex. Taking low-dose aspirin every day. Taking vitamin and mineral supplements as recommended by your health care provider. What happens during an annual well check? The services and screenings done by your health care provider during your annual well check will depend on  your age, overall health, lifestyle risk factors, and family history of disease. Counseling  Your health care provider may ask you questions about your: Alcohol use. Tobacco use. Drug use. Emotional well-being. Home and relationship well-being. Sexual activity. Eating habits. History of falls. Memory and ability to understand (cognition). Work and work Statistician. Reproductive health. Screening  You may have the following tests or measurements: Height, weight, and BMI. Blood pressure. Lipid and cholesterol levels. These may be checked every 5 years, or more frequently if you are over 60 years old. Skin check. Lung cancer screening. You may have this screening every year starting at age 60 if you have a 30-pack-year history of smoking and currently smoke or have quit within the past 15 years. Fecal occult blood test (FOBT) of the stool. You may have this test every year starting at age 60. Flexible sigmoidoscopy or colonoscopy. You may have a sigmoidoscopy every 5 years or a colonoscopy every 10 years starting at age 60. Hepatitis C blood test. Hepatitis B blood test. Sexually transmitted disease (STD) testing. Diabetes screening. This is done by checking your blood sugar (glucose) after you have not eaten for a while (fasting). You may have this done every 1-3 years. Bone density scan. This is done to screen for osteoporosis. You may have this done starting at age 60. Mammogram. This may be done every 1-2 years. Talk to your health care provider about how often you should have regular mammograms. Talk with your health  care provider about your test results, treatment options, and if necessary, the need for more tests. Vaccines  Your health care provider may recommend certain vaccines, such as: Influenza vaccine. This is recommended every year. Tetanus, diphtheria, and acellular pertussis (Tdap, Td) vaccine. You may need a Td booster every 10 years. Zoster vaccine. You may need this  after age 60. Pneumococcal 13-valent conjugate (PCV13) vaccine. One dose is recommended after age 60. Pneumococcal polysaccharide (PPSV23) vaccine. One dose is recommended after age 60. Talk to your health care provider about which screenings and vaccines you need and how often you need them. This information is not intended to replace advice given to you by your health care provider. Make sure you discuss any questions you have with your health care provider. Document Released: 11/09/2015 Document Revised: 07/02/2016 Document Reviewed: 08/14/2015 Elsevier Interactive Patient Education  2017 [place] Prevention in the Home Falls can cause injuries. They can happen to people of all ages. There are many things you can do to make your home safe and to help prevent falls. What can I do on the outside of my home? Regularly fix the edges of walkways and driveways and fix any cracks. Remove anything that might make you trip as you walk through a door, such as a raised step or threshold. Trim any bushes or trees on the path to your home. Use bright outdoor lighting. Clear any walking paths of anything that might make someone trip, such as rocks or tools. Regularly check to see if handrails are loose or broken. Make sure that both sides of any steps have handrails. Any raised decks and porches should have guardrails on the edges. Have any leaves, snow, or ice cleared regularly. Use sand or salt on walking paths during winter. Clean up any spills in your garage right away. This includes oil or grease spills. What can I do in the bathroom? Use night lights. Install grab bars by the toilet and in the tub and shower. Do not use towel bars as grab bars. Use non-skid mats or decals in the tub or shower. If you need to sit down in the shower, use a plastic, non-slip stool. Keep the floor dry. Clean up any water that spills on the floor as soon as it happens. Remove soap buildup in the tub or  shower regularly. Attach bath mats securely with double-sided non-slip rug tape. Do not have throw rugs and other things on the floor that can make you trip. What can I do in the bedroom? Use night lights. Make sure that you have a light by your bed that is easy to reach. Do not use any sheets or blankets that are too big for your bed. They should not hang down onto the floor. Have a firm chair that has side arms. You can use this for support while you get dressed. Do not have throw rugs and other things on the floor that can make you trip. What can I do in the kitchen? Clean up any spills right away. Avoid walking on wet floors. Keep items that you use a lot in easy-to-reach places. If you need to reach something above you, use a strong step stool that has a grab bar. Keep electrical cords out of the way. Do not use floor polish or wax that makes floors slippery. If you must use wax, use non-skid floor wax. Do not have throw rugs and other things on the floor that can make you trip. What can  I do with my stairs? Do not leave any items on the stairs. Make sure that there are handrails on both sides of the stairs and use them. Fix handrails that are broken or loose. Make sure that handrails are as long as the stairways. Check any carpeting to make sure that it is firmly attached to the stairs. Fix any carpet that is loose or worn. Avoid having throw rugs at the top or bottom of the stairs. If you do have throw rugs, attach them to the floor with carpet tape. Make sure that you have a light switch at the top of the stairs and the bottom of the stairs. If you do not have them, ask someone to add them for you. What else can I do to help prevent falls? Wear shoes that: Do not have high heels. Have rubber bottoms. Are comfortable and fit you well. Are closed at the toe. Do not wear sandals. If you use a stepladder: Make sure that it is fully opened. Do not climb a closed stepladder. Make  sure that both sides of the stepladder are locked into place. Ask someone to hold it for you, if possible. Clearly mark and make sure that you can see: Any grab bars or handrails. First and last steps. Where the edge of each step is. Use tools that help you move around (mobility aids) if they are needed. These include: Canes. Walkers. Scooters. Crutches. Turn on the lights when you go into a dark area. Replace any light bulbs as soon as they burn out. Set up your furniture so you have a clear path. Avoid moving your furniture around. If any of your floors are uneven, fix them. If there are any pets around you, be aware of where they are. Review your medicines with your doctor. Some medicines can make you feel dizzy. This can increase your chance of falling. Ask your doctor what other things that you can do to help prevent falls. This information is not intended to replace advice given to you by your health care provider. Make sure you discuss any questions you have with your health care provider. Document Released: 08/09/2009 Document Revised: 03/20/2016 Document Reviewed: 11/17/2014 Elsevier Interactive Patient Education  2017 [place].  Opioid Pain Medicine Management Opioids are powerful medicines that are used to treat moderate to severe pain. When used for short periods of time, they can help you to: Sleep better. Do better in physical or occupational therapy. Feel better in the first few days after an injury. Recover from surgery. Opioids should be taken with the supervision of a trained health care provider. They should be taken for the shortest period of time possible. This is because opioids can be addictive, and the longer you take opioids, the greater your risk of addiction. This addiction can also be called opioid use disorder. What are the risks? Using opioid pain medicines for longer than 3 days increases your risk of side effects. Side effects  include: Constipation. Nausea and vomiting. Breathing difficulties (respiratory depression). Drowsiness. Confusion. Opioid use disorder. Itching. Taking opioid pain medicine for a long period of time can affect your ability to do daily tasks. It also puts you at risk for: Motor vehicle crashes. Depression. Suicide. Heart attack. Overdose, which can be life-threatening. What is a pain treatment plan? A pain treatment plan is an agreement between you and your health care provider. Pain is unique to each person, and treatments vary depending on your condition. To manage your pain, you and  your health care provider need to work together. To help you do this: Discuss the goals of your treatment, including how much pain you might expect to have and how you will manage the pain. Review the risks and benefits of taking opioid medicines. Remember that a good treatment plan uses more than one approach and minimizes the chance of side effects. Be honest about the amount of medicines you take and about any drug or alcohol use. Get pain medicine prescriptions from only one health care provider. Pain can be managed with many types of alternative treatments. Ask your health care provider to refer you to one or more specialists who can help you manage pain through: Physical or occupational therapy. Counseling (cognitive behavioral therapy). Good nutrition. Biofeedback. Massage. Meditation. Non-opioid medicine. Following a gentle exercise program. How to use opioid pain medicine Taking medicine Take your pain medicine exactly as told by your health care provider. Take it only when you need it. If your pain gets less severe, you may take less than your prescribed dose if your health care provider approves. If you are not having pain, do nottake pain medicine unless your health care provider tells you to take it. If your pain is severe, do nottry to treat it yourself by taking more pills than  instructed on your prescription. Contact your health care provider for help. Write down the times when you take your pain medicine. It is easy to become confused while on pain medicine. Writing the time can help you avoid overdose. Take other over-the-counter or prescription medicines only as told by your health care provider. Keeping yourself and others safe  While you are taking opioid pain medicine: Do not drive, use machinery, or power tools. Do not sign legal documents. Do not drink alcohol. Do not take sleeping pills. Do not supervise children by yourself. Do not do activities that require climbing or being in high places. Do not go to a lake, river, ocean, spa, or swimming pool. Do not share your pain medicine with anyone. Keep pain medicine in a locked cabinet or in a secure area where pets and children cannot reach it. Stopping your use of opioids If you have been taking opioid medicine for more than a few weeks, you may need to slowly decrease (taper) how much you take until you stop completely. Tapering your use of opioids can decrease your risk of symptoms of withdrawal, such as: Pain and cramping in the abdomen. Nausea. Sweating. Sleepiness. Restlessness. Uncontrollable shaking (tremors). Cravings for the medicine. Do not attempt to taper your use of opioids on your own. Talk with your health care provider about how to do this. Your health care provider may prescribe a step-down schedule based on how much medicine you are taking and how long you have been taking it. Getting rid of leftover pills Do not save any leftover pills. Get rid of leftover pills safely by: Taking the medicine to a prescription take-back program. This is usually offered by the county or law enforcement. Bringing them to a pharmacy that has a drug disposal container. Flushing them down the toilet. Check the label or package insert of your medicine to see whether this is safe to do. Throwing them out in  the trash. Check the label or package insert of your medicine to see whether this is safe to do. If it is safe to throw it out, remove the medicine from the original container, put it into a sealable bag or container, and mix it with  used coffee grounds, food scraps, dirt, or cat litter before putting it in the trash. Follow these instructions at home: Activity Do exercises as told by your health care provider. Avoid activities that make your pain worse. Return to your normal activities as told by your health care provider. Ask your health care provider what activities are safe for you. General instructions You may need to take these actions to prevent or treat constipation: Drink enough fluid to keep your urine pale yellow. Take over-the-counter or prescription medicines. Eat foods that are high in fiber, such as beans, whole grains, and fresh fruits and vegetables. Limit foods that are high in fat and processed sugars, such as fried or sweet foods. Keep all follow-up visits. This is important. Where to find support If you have been taking opioids for a long time, you may benefit from receiving support for quitting from a local support group or counselor. Ask your health care provider for a referral to these resources in your area. Where to find more information Centers for Disease Control and Prevention (CDC): http://www.wolf.info/ [place] Food and Drug Administration (FDA): [place] Get help right away if: You may have taken too much of an opioid (overdosed). Common symptoms of an overdose: Your breathing is slower or more shallow than normal. You have a very slow heartbeat (pulse). You have slurred speech. You have nausea and vomiting. Your pupils become very small. You have other potential symptoms: You are very confused. You faint or feel like you will faint. You have cold, clammy skin. You have blue lips or fingernails. You have thoughts of harming yourself or harming others. These  symptoms may represent a serious problem that is an emergency. Do not wait to see if the symptoms will go away. Get medical help right away. Call your local emergency services (911 in the [place] Do not drive yourself to the hospital.  If you ever feel like you may hurt yourself or others, or have thoughts about taking your own life, get help right away. Go to your nearest emergency department or: Call your local emergency services (911 in the [place] Call the [facility] 301-621-2199 in the [place] Call a suicide crisis helpline, such as the [place] at (682)303-0869 or 988 in the [place]. This is open 24 hours a day in the [place] Text the Crisis Text Line at 4235143865 (in the [place].). Summary Opioid medicines can help you manage moderate to severe pain for a short period of time. A pain treatment plan is an agreement between you and your health care provider. Discuss the goals of your treatment, including how much pain you might expect to have and how you will manage the pain. If you think that you or someone else may have taken too much of an opioid, get medical help right away. This information is not intended to replace advice given to you by your health care provider. Make sure you discuss any questions you have with your health care provider. Document Revised: 05/08/2021 Document Reviewed: 01/23/2021 Elsevier Patient Education  [place].

## 2021-09-12 NOTE — Progress Notes (Signed)
Subjective:   [name] is a 60 y.o. female who presents for Medicare Annual (Subsequent) preventive examination.  Review of Systems    No ROS Cardiac Risk Factors include: advanced age (>69men, >69 women);hypertension    Objective:    Today's Vitals   09/12/21 1116  BP: 130/80  Weight: 168 lb (76.2 kg)  Height: 5\' 5"  (1.651 m)   Body mass index is 27.96 kg/m.  Advanced Directives 09/12/2021 07/12/2021 05/28/2021 05/13/2021 05/13/2021 05/03/2021 01/04/2021  Does Patient Have a Medical Advance Directive? No No No No No No No  Would patient like information on creating a medical advance directive? No - Patient declined No - Patient declined No - Patient declined No - Patient declined - No - Patient declined No - Patient declined    Current Medications (verified) Outpatient Encounter Medications as of 09/12/2021  Medication Sig   ALPRAZolam (XANAX) 0.5 MG tablet Take 1/2 to 1 tablet (0.25-0.5 mg total) by mouth at bedtime as needed for anxiety.   atorvastatin (LIPITOR) 20 MG tablet TAKE 1 TABLET BY MOUTH ONCE DAILY   baclofen (LIORESAL) 10 MG tablet TAKE 1 TABLET BY MOUTH THREE TIMES DAILY AS NEEDED   busPIRone (BUSPAR) 5 MG tablet Take 1 tablet by mouth 2 (two) times daily.   carvedilol (COREG) 25 MG tablet Take 2 tablets (50 mg total) by mouth 2 (two) times daily with a meal.   cholecalciferol (VITAMIN D3) 25 MCG (1000 UNIT) tablet Take 1,000 Units by mouth daily.   diphenhydrAMINE (BENADRYL) 25 MG tablet Take 25 mg by mouth daily as needed for itching.   DULoxetine (CYMBALTA) 60 MG capsule Take 1 capsule (60 mg total) by mouth daily.   Estradiol Acetate (FEMRING) 0.05 MG/24HR RING USE ONE RING VAGINALLY EVERY 3 MONTHS AS DIRECTED   gabapentin (NEURONTIN) 400 MG capsule Take 1 capsule by mouth three times daily.   LORazepam (ATIVAN) 1 MG tablet Take 1 tablet by mouth 30-60 minutes prior to test.   LORazepam (ATIVAN) 1 MG tablet Take 1 tablet (1 mg total) by mouth 30-60  minutes before procedure   meclizine (ANTIVERT) 25 MG tablet TAKE 1 TABLET BY MOUTH 2 TIMES DAILY AS NEEDED FOR DIZZINESS   oxyCODONE-acetaminophen (PERCOCET) 7.5-325 MG tablet Take 1 tablet by mouth every 6 (six) hours as needed for severe pain.   senna-docusate (SENOKOT-S) 8.6-50 MG tablet Take 1 tablet by mouth 2 (two) times daily.   telmisartan (MICARDIS) 40 MG tablet Take 1 tablet (40 mg total) by mouth daily.   traMADol (ULTRAM) 50 MG tablet Take 50 mg by mouth every 6 (six) hours as needed.   No facility-administered encounter medications on file as of 09/12/2021.    Allergies (verified) Cephalosporins; Penicillins; Anesthetics, amide; Betadine [povidone iodine]; Latex; Peach [prunus persica]; and Claritin [loratadine]   History: Past Medical History:  Diagnosis Date   Anxiety    Aortic atherosclerosis (HCC) 04/16/2021   Atypical angina (HCC)    Back pain    related to spinal stenosis and disc problem, radiates down left buttocks to leg., weakness occ.   Chest pain    a. 03/2015 Cath: nl cors; b. 03/2021 Cor CTA: Ca2+ = 0. Nl Cors.   Chronic pain syndrome    Dyspnea    GERD (gastroesophageal reflux disease)    Grade I diastolic dysfunction    Headache    Hyperlipidemia    Hypertension    Lumbar post-laminectomy syndrome    LVH (left ventricular hypertrophy) 12/15/2020   a.  11/2020 Echo: EF 65-70%, no rwma, sev asymm LVH with IVSd 1.9 cm. No LVOT obs @ rest. Gr1 DD. Triv MR.   PONV (postoperative nausea and vomiting)    Pulmonary nodules    a. 03/2021 CT Chest: 46mm pulm nodules in bilat lower lobes. F/u 1 yr.   Right foot drop    Syncope    a. 11/2020 Zio: No significant arrhythmias.   Vaginal foreign object    "Uses Femring"   Past Surgical History:  Procedure Laterality Date   ABDOMINAL HYSTERECTOMY     BIOPSY  12/16/2020   Procedure: BIOPSY;  Surgeon: Ladene Artist, MD;  Location: [place];  Service: Endoscopy;;   CARDIAC CATHETERIZATION N/A 04/18/2015    Procedure: Left Heart Cath and Coronary Angiography;  Surgeon: [name], MD;  Location: [place] [facility];  Service: Cardiovascular;  Laterality: N/A;   COLONOSCOPY W/ BIOPSIES AND POLYPECTOMY     ESOPHAGOGASTRODUODENOSCOPY (EGD) WITH PROPOFOL N/A 12/16/2020   Procedure: ESOPHAGOGASTRODUODENOSCOPY (EGD) WITH PROPOFOL;  Surgeon: Ladene Artist, MD;  Location: [facility] ENDOSCOPY;  Service: Endoscopy;  Laterality: N/A;   FOOT SURGERY Bilateral    [place] "bunion,bone spur, tendon" (1) -6'16, (1)-10'16   IR EPIDUROGRAPHY  07/21/2018   LUMBAR LAMINECTOMY/DECOMPRESSION MICRODISCECTOMY Bilateral 12/28/2015   Procedure: MICRO LUMBAR DECOMPRESSION L4 - L5 BILATERALLY;  Surgeon: [name], MD;  Location: [facility];  Service: Orthopedics;  Laterality: Bilateral;   LUMBAR LAMINECTOMY/DECOMPRESSION MICRODISCECTOMY Bilateral 03/04/2018   Procedure: Revision of Microlumbar Decompression Bilateral Lumbar Four-Five;  Surgeon: [name], MD;  Location: [place];  Service: Orthopedics;  Laterality: Bilateral;  90 mins   SAVORY DILATION N/A 12/16/2020   Procedure: SAVORY DILATION;  Surgeon: Ladene Artist, MD;  Location: [facility]COPY;  Service: Endoscopy;  Laterality: N/A;   SPINAL CORD STIMULATOR INSERTION N/A 09/28/2019   Procedure: THORACIC SPINAL CORD STIMULATOR INSERTION;  Surgeon: Melina Schools, MD;  Location: [place];  Service: Orthopedics;  Laterality: N/A;  2.5 hrs   SPINAL CORD STIMULATOR REMOVAL N/A 05/27/2021   Procedure: LUMBAR SPINAL CORD STIMULATOR REMOVAL;  Surgeon: Deetta Perla, MD;  Location: [facility] ORS;  Service: Neurosurgery;  Laterality: N/A;   TUBAL LIGATION     WISDOM TOOTH EXTRACTION     WOUND EXPLORATION N/A 03/04/2018   Procedure: EXPLORATION OF LUMBAR DECOMPRESSION WOUND;  Surgeon: [name], MD;  Location: [place];  Service: Orthopedics;  Laterality: N/A;   Family History  Problem Relation Age of Onset   Heart attack Mother    Lung cancer Father    Cancer Father    Pancreatic  cancer Sister    Breast cancer Sister 13   Throat cancer Brother    Multiple myeloma Sister    Breast cancer Sister        diagnosed in her 59's   Heart attack Sister    Stomach cancer Cousin    Colon cancer Neg Hx    Social History   Socioeconomic History   Marital status: Married    Spouse name: Not on file   Number of children: Not on file   Years of education: Not on file   Highest education level: Not on file  Occupational History   Not on file  Tobacco Use   Smoking status: Never   Smokeless tobacco: Never  Vaping Use   Vaping Use: Never used  Substance and Sexual Activity   Alcohol use: No   Drug use: No   Sexual activity: Yes    Partners: Male  Birth control/protection: Post-menopausal  Other Topics Concern   Not on file  Social History Narrative   Not on file   Social Determinants of Health   Financial Resource Strain: Low Risk    Difficulty of Paying Living Expenses: Not hard at all  Food Insecurity: No Food Insecurity   Worried About Charity fundraiser in the Last Year: Never true   Arboriculturist in the Last Year: Never true  Transportation Needs: No Transportation Needs   Lack of Transportation (Medical): No   Lack of Transportation (Non-Medical): No  Physical Activity: Insufficiently Active   Days of Exercise per Week: 2 days   Minutes of Exercise per Session: 40 min  Stress: No Stress Concern Present   Feeling of Stress : Not at all  Social Connections: Socially Integrated   Frequency of Communication with Friends and Family: More than three times a week   Frequency of Social Gatherings with Friends and Family: Twice a week   Attends Religious Services: 1 to 4 times per year   Active Member of [employer] or Organizations: Yes   Attends Archivist Meetings: 1 to 4 times per year   Marital Status: Married   Clinical Intake:  Diabetic? No  Interpreter Needed?: NoActivities of Daily Living In your present state of health, do you have  any difficulty performing the following activities: 09/12/2021 05/28/2021  Hearing? N N  Vision? N N  Comment Wears reading glasses -  Difficulty concentrating or making decisions? N N  Walking or climbing stairs? N Y  Comment Wears a AFO Brace and uses a cane -  Dressing or bathing? N N  Doing errands, shopping? N Y  Conservation officer, nature and eating ? N -  Using the Toilet? N -  In the past six months, have you accidently leaked urine? N -  Do you have problems with loss of bowel control? N -  Managing your Medications? N -  Managing your Finances? N -  Housekeeping or managing your Housekeeping? N -  Some recent data might be hidden    Patient Care Team: [name], [name], MD as PCP - General (Family Medicine) Debara Pickett Nadean Corwin, MD as PCP - General (Cardiology) Debara Pickett Nadean Corwin, MD as PCP - Cardiology (Cardiology) [name], MD as Consulting Physician (Orthopedic Surgery) Viona Gilmore, [facility] as Pharmacist (Pharmacist)  Indicate any recent Medical Services you may have received from other than [facility] providers in the past year (date may be approximate).     Assessment:   This is a routine wellness examination for Blue.  Virtual Visit via Telephone Note  I connected with  [name] on 09/12/21 at 11:15 AM EST by telephone and verified that I am speaking with the correct person using two identifiers.  Location:  Patient: Home Provider: Office  Persons participating in the virtual visit: patient/Nurse Health Advisor   I discussed the limitations, risks, security and privacy concerns of performing an evaluation and management service by telephone and the availability of in person appointments. The patient expressed understanding and agreed to proceed.  Interactive audio and video telecommunications were attempted between this nurse and patient, however failed, due to patient having technical difficulties OR patient did not have access to video capability.  We  continued and completed visit with audio only.  Some vital signs may be absent or patient reported.   Criselda Peaches, LPN   Hearing/Vision screen Hearing Screening - Comments:: No difficulty hearing Vision Screening -  Comments:: Wears reading glasses. Not currently followed by a provider  Dietary issues and exercise activities discussed: Current Exercise Habits: Structured exercise class, Type of exercise: walking, Time (Minutes): 45, Frequency (Times/Week): 2, Weekly Exercise (Minutes/Week): 90, Intensity: Mild  DIET: Regular   Goals Addressed             This Visit's Progress    Increase physical activity       Patient wants to increase ability to walk more.       Depression Screen PHQ 2/9 Scores 09/12/2021 07/31/2021 04/22/2020 08/17/2018 07/05/2018 05/03/2018 04/01/2018  PHQ - 2 Score 0 0 2 0 0 0 2  PHQ- 9 Score - - 10 - - - 5    Fall Risk Fall Risk  09/12/2021 07/31/2021 05/01/2021 12/26/2020 08/15/2020  Falls in the past year? 1 0 0 1 0  Comment Patient had a fall in Dr Morrison Old office past year without injury - [place] at MD office Dr. [name] Feb 2022 - no injury. -  Number falls in past yr: 0 0 - 0 -  Comment - - - - -  Injury with Fall? 1 0 - 0 -  Risk for fall due to : - - - - -  Follow up - - - - -    FALL RISK PREVENTION PERTAINING TO THE HOME:  Any stairs in or around the home? Yes  If so, are there any without handrails? No  Home free of loose throw rugs in walkways, pet beds, electrical cords, etc? Yes  Adequate lighting in your home to reduce risk of falls? Yes   ASSISTIVE DEVICES UTILIZED TO PREVENT FALLS:  Life alert? No  Use of a cane, walker or w/c? Yes  Grab bars in the bathroom? No  Shower chair or bench in shower? Yes  Elevated toilet seat or a handicapped toilet? Yes   TIMED UP AND GO:  Was the test performed? No . Audio Visit  Cognitive Function:   6CIT Screen 09/12/2021  What Year? 0 points  What month? 0 points  What time? 0 points   Count back from 20 0 points  Months in reverse 0 points   Immunizations Immunization History  Administered Date(s) Administered   Influenza,inj,Quad PF,6+ Mos 08/03/2018, 08/09/2019, 08/20/2020, 07/04/2021   Influenza,inj,quad, With Preservative 07/27/2017   PFIZER(Purple Top)SARS-COV-2 Vaccination 12/14/2019, 01/05/2020, 07/24/2020, 02/15/2021   Td 10/28/2007   Tdap 10/27/2012, 06/09/2014   Zoster Recombinat (Shingrix) 08/27/2021    Flu Vaccine status: Up to date  Pneumococcal vaccine status: Due, Education has been provided regarding the importance of this vaccine. Advised may receive this vaccine at local pharmacy or Health Dept. Aware to provide a copy of the vaccination record if obtained from local pharmacy or Health Dept. Verbalized acceptance and understanding.  Covid-19 vaccine status: Completed vaccines  Screening Tests Health Maintenance  Topic Date Due   COVID-19 Vaccine (5 - Booster for Pfizer series) 04/12/2021   Zoster Vaccines- Shingrix (2 of 2) 10/22/2021   COLONOSCOPY (Pts 45-57yrs Insurance coverage will need to be confirmed)  09/04/2022   MAMMOGRAM  06/29/2023   TETANUS/TDAP  06/09/2024   Pneumococcal Vaccine 28-24 Years old  Completed   INFLUENZA VACCINE  Completed   Hepatitis C Screening  Completed   HIV Screening  Completed   HPV VACCINES  Aged Out   PAP SMEAR-Modifier  Discontinued    Health Maintenance  Health Maintenance Due  Topic Date Due   COVID-19 Vaccine (5 - Booster for [employer] series)  04/12/2021    Vision Screening: Recommended annual ophthalmology exams for early detection of glaucoma and other disorders of the eye. Is the patient up to date with their annual eye exam?  No  Who is the provider or what is the name of the office in which the patient attends annual eye exams? Patient Deferred If pt is not established with a provider, would they like to be referred to a provider to establish care? Yes .   Dental Screening: Recommended  annual dental exams for proper oral hygiene  Community Resource Referral / Chronic Care Management:   CRR required this visit?  No   CCM required this visit?  No     Plan:     I have personally reviewed and noted the following in the patient's chart:   Medical and social history Use of alcohol, tobacco or illicit drugs  Current medications and supplements including opioid prescriptions. Currently taking opioids. Followed by Dr Naaman Plummer Functional ability and status Nutritional status Physical activity Advanced directives List of other physicians Hospitalizations, surgeries, and ER visits in previous 12 months Vitals Screenings to include cognitive, depression, and falls Referrals and appointments  In addition, I have reviewed and discussed with patient certain preventive protocols, quality metrics, and best practice recommendations. A written personalized care plan for preventive services as well as general preventive health recommendations were provided to patient.     Criselda Peaches, LPN   [id number]

## 2021-09-13 DIAGNOSIS — M549 Dorsalgia, unspecified: Secondary | ICD-10-CM | POA: Diagnosis not present

## 2021-09-13 DIAGNOSIS — I10 Essential (primary) hypertension: Secondary | ICD-10-CM | POA: Diagnosis not present

## 2021-09-13 DIAGNOSIS — G8929 Other chronic pain: Secondary | ICD-10-CM | POA: Diagnosis not present

## 2021-09-13 DIAGNOSIS — Z6828 Body mass index (BMI) 28.0-28.9, adult: Secondary | ICD-10-CM | POA: Diagnosis not present

## 2021-09-13 DIAGNOSIS — Z9989 Dependence on other enabling machines and devices: Secondary | ICD-10-CM | POA: Diagnosis not present

## 2021-09-13 DIAGNOSIS — Z9181 History of falling: Secondary | ICD-10-CM | POA: Diagnosis not present

## 2021-09-16 ENCOUNTER — Telehealth: Payer: Self-pay | Admitting: Family Medicine

## 2021-09-16 ENCOUNTER — Ambulatory Visit: Payer: PPO | Admitting: Physical Therapy

## 2021-09-16 ENCOUNTER — Other Ambulatory Visit: Payer: Self-pay

## 2021-09-16 DIAGNOSIS — R2689 Other abnormalities of gait and mobility: Secondary | ICD-10-CM

## 2021-09-16 DIAGNOSIS — R2681 Unsteadiness on feet: Secondary | ICD-10-CM

## 2021-09-16 DIAGNOSIS — M6281 Muscle weakness (generalized): Secondary | ICD-10-CM

## 2021-09-16 NOTE — Telephone Encounter (Signed)
Jana Half NP with [facility] is calling to let md know she had 1st est visit with pt today . The referral from her insurance plan. This does not replace the md just additional resource for the patient

## 2021-09-17 ENCOUNTER — Encounter: Payer: Self-pay | Admitting: Physical Therapy

## 2021-09-17 DIAGNOSIS — L668 Other cicatricial alopecia: Secondary | ICD-10-CM | POA: Diagnosis not present

## 2021-09-17 NOTE — Therapy (Signed)
[facility] [address] [place], [place], [address] Phone: [phone]   Fax:  [phone]  Physical Therapy Treatment & 10th Visit Progress Note  Reporting Period:  07-11-21 - 09-16-21 See below for progress towards goals  Patient Details  Name: [name] MRN: [id number] Date of Birth: 03/03/1961 Referring Provider (PT): Susanne Borders, [place]   Encounter Date: 09/16/2021   PT End of Session - 09/17/21 2014     Visit Number 10    Number of Visits 24    Date for PT Re-Evaluation 10/04/21    Authorization Type [employer] medicare    Authorization Time Period 07-11-21 - 10-04-21    Progress Note Due on Visit 10    PT Start Time 1550    PT Stop Time 1635    PT Time Calculation (min) 45 min    Equipment Utilized During Treatment Other (comment)   pool noodle (large)   Activity Tolerance Patient tolerated treatment well   limited by increased headache with sit to Rt sidelying   Behavior During Therapy [facility] for tasks assessed/performed             Past Medical History:  Diagnosis Date   Anxiety    Aortic atherosclerosis (HCC) 04/16/2021   Atypical angina (HCC)    Back pain    related to spinal stenosis and disc problem, radiates down left buttocks to leg., weakness occ.   Chest pain    a. 03/2015 Cath: nl cors; b. 03/2021 Cor CTA: Ca2+ = 0. Nl Cors.   Chronic pain syndrome    Dyspnea    GERD (gastroesophageal reflux disease)    Grade I diastolic dysfunction    Headache    Hyperlipidemia    Hypertension    Lumbar post-laminectomy syndrome    LVH (left ventricular hypertrophy) 12/15/2020   a. 11/2020 Echo: EF 65-70%, no rwma, sev asymm LVH with IVSd 1.9 cm. No LVOT obs @ rest. Gr1 DD. Triv MR.   PONV (postoperative nausea and vomiting)    Pulmonary nodules    a. 03/2021 CT Chest: 62mm pulm nodules in bilat lower lobes. F/u 1 yr.   Right foot drop    Syncope    a. 11/2020 Zio: No significant arrhythmias.    Vaginal foreign object    "Uses Femring"    Past Surgical History:  Procedure Laterality Date   ABDOMINAL HYSTERECTOMY     BIOPSY  12/16/2020   Procedure: BIOPSY;  Surgeon: Meryl Dare, MD;  Location: [facility] ENDOSCOPY;  Service: Endoscopy;;   CARDIAC CATHETERIZATION N/A 04/18/2015   Procedure: Left Heart Cath and Coronary Angiography;  Surgeon: Rinaldo Cloud, MD;  Location: [facility] INVASIVE CV LAB;  Service: Cardiovascular;  Laterality: N/A;   COLONOSCOPY W/ BIOPSIES AND POLYPECTOMY     ESOPHAGOGASTRODUODENOSCOPY (EGD) WITH PROPOFOL N/A 12/16/2020   Procedure: ESOPHAGOGASTRODUODENOSCOPY (EGD) WITH PROPOFOL;  Surgeon: Meryl Dare, MD;  Location: [facility] ENDOSCOPY;  Service: Endoscopy;  Laterality: N/A;   FOOT SURGERY Bilateral    [facility] "bunion,bone spur, tendon" (1) -6'16, (1)-10'16   IR EPIDUROGRAPHY  07/21/2018   LUMBAR LAMINECTOMY/DECOMPRESSION MICRODISCECTOMY Bilateral 12/28/2015   Procedure: MICRO LUMBAR DECOMPRESSION L4 - L5 BILATERALLY;  Surgeon: Jene Every, MD;  Location: [facility] ORS;  Service: Orthopedics;  Laterality: Bilateral;   LUMBAR LAMINECTOMY/DECOMPRESSION MICRODISCECTOMY Bilateral 03/04/2018   Procedure: Revision of Microlumbar Decompression Bilateral Lumbar Four-Five;  Surgeon: Jene Every, MD;  Location: [facility] OR;  Service: Orthopedics;  Laterality: Bilateral;  90 mins  SAVORY DILATION N/A 12/16/2020   Procedure: SAVORY DILATION;  Surgeon: Ladene Artist, MD;  Location: [facility] ENDOSCOPY;  Service: Endoscopy;  Laterality: N/A;   SPINAL CORD STIMULATOR INSERTION N/A 09/28/2019   Procedure: THORACIC SPINAL CORD STIMULATOR INSERTION;  Surgeon: Melina Schools, MD;  Location: [place];  Service: Orthopedics;  Laterality: N/A;  2.5 hrs   SPINAL CORD STIMULATOR REMOVAL N/A 05/27/2021   Procedure: LUMBAR SPINAL CORD STIMULATOR REMOVAL;  Surgeon: Deetta Perla, MD;  Location: [facility] ORS;  Service: Neurosurgery;  Laterality: N/A;   TUBAL LIGATION     WISDOM TOOTH EXTRACTION     WOUND  EXPLORATION N/A 03/04/2018   Procedure: EXPLORATION OF LUMBAR DECOMPRESSION WOUND;  Surgeon: [name], MD;  Location: [place];  Service: Orthopedics;  Laterality: N/A;    There were no vitals filed for this visit.   Subjective Assessment - 09/17/21 2010     Subjective Pt presents for aquatic therapy - states she had a lot of pain over the weekend - called MD and spoke with nurse    Pertinent History Spinal cord stimulator insertion on 09-28-19; stimulator removal on 05-27-21 with resultant LLE weakness;  decompression and laminectomy on 12/28/15 at L4-L5  with redo decompression surgery on May 9,2019, post-op paraparesis with surgery to remove small hematoma; 07/12/18 fall at work (involved rolling chair) with hospital admission, transfer to [place] with d/c home 08/03/18; HTN, Bil foot surgery; left ventricular hypertrophy, chronic pain syndrome, dyspnea, hypertrophic cardiomyopathy    Patient Stated Goals "To be able to move left leg like I was able to before I had the surgery on 05-27-21"    Currently in Pain? Yes    Pain Score 2     Pain Location Back    Pain Orientation Lower;Mid    Pain Descriptors / Indicators Aching;Discomfort;Dull    Pain Type Chronic pain    Pain Onset More than a month ago    Pain Frequency Intermittent                Aquatic therapy at [facility]  - pool temp 94 degrees   Patient seen for aquatic therapy today.  Treatment took place in water 3.5-4 feet deep depending upon activity.  Pt entered and  Exited the pool via step negotiation with use of bil. Hand rails with min assist  Pt performed runner's stretch on each leg - 30 sec hold x 1 rep each leg  Pt performed amb. 18' x approx. 8 reps across pool with bil. UE support on pool noodle  Performed side stepping 18'  x 4 reps with bil. UE support on pool noodle with min assist for stabilization by PT  Pt performed squats 10 reps bil. LE's with UE support on pool wall (small range)  Pt performed step ups  with LLE onto aquatic step in 4'0 water depth with min HHA with LUE support on pool wall - 10 reps  Pt requires aquatic therapy for the unweighting provided by the buoyancy of the water and needs the viscosity for strengthening LE's and trunk musc.; buoyancy of water needed for spinal decompression for increased tolerance for movement with less pain experienced than with land exercises                              PT Short Term Goals - 09/17/21 2027       PT SHORT TERM GOAL #1   Title Pt will perform sit to  stand transfers 5 reps with bil. UE support in </= 26 secs to demo improved LLE strength.    Baseline 3 reps - 32.72 secs with bil. UE support;   1st trial 1.00";  28.28 secs with bil. UE support on 2nd trial    Time 4    Period Weeks    Status On-going    Target Date 09/06/21      PT SHORT TERM GOAL #2   Title Pt will improve TUG score from 39.78 secs to </= 34 secs with rollator to demo improved functional mobility. UPGRADE:  score </= 24.5 secs with rollator    Baseline 39.78 secs with rollator;  28.12 secs with rollator    Time 4    Period Weeks    Status Revised    Target Date 09/06/21      PT SHORT TERM GOAL #3   Title Pt will amb. 4" nonstop with use of rollator with SBA for increased endurance and to demo improved LE strength.    Baseline pt amb. 3" 49 secs with rollator    Time 4    Period Weeks    Status On-going    Target Date 09/06/21      PT SHORT TERM GOAL #4   Title Assess step negotiation with use of handrails.    Time 4    Period Weeks    Status Achieved    Target Date 08/09/21      PT SHORT TERM GOAL #5   Title Pt will stand for 30 secs without UE support with SBA for increased independence with ADL's. UPGRADE - stand for 1.5" without UE support    Time 4    Period Weeks    Status Revised    Target Date 09/06/21      PT SHORT TERM GOAL #6   Title Independent in HEP for LLE strengthening and balance exercises.    Time 4     Period Weeks    Status Achieved    Target Date 08/09/21               PT Long Term Goals - 09/17/21 2028       PT LONG TERM GOAL #1   Title Pt will amb. short distances, i.e. 32' with use of SPC with SBA.    Time 12    Period Weeks    Status New      PT LONG TERM GOAL #2   Title Improve TUG score to </= 26 secs with use of rollator to demo improved functional mobility and reduced fall risk.    Baseline 39.78 secs with rollator    Time 12    Period Weeks    Status New      PT LONG TERM GOAL #3   Title Pt will increase gait velocity from 1.02 ft/sec to >/= 1.8 ft/sec with rollator for increased gait efficiency.    Baseline 32.16 secs = 1.02 ft/sec with rollator on 07-11-21    Time 12    Period Weeks    Status New      PT LONG TERM GOAL #4   Title Pt will stand for at least 10" with UE support prn for increased independence with ADL's.    Time 12    Period Weeks    Status New      PT LONG TERM GOAL #5   Title Pt will be independent in HEP for strengthening & balance exercises.    Time 12  Period Weeks    Status New                   Plan - 09/17/21 2016     Clinical Impression Statement This 10th visit progress note covers dates 07-11-21 - 09-16-21.  Pt has met STG's #2, 4-6:  other STG's not fully met but improved from initial assessment.  Pt continues to amb. with use of rollator for community and household amb.  Pt able to negotiate steps to enter pool by descending sideways with use of 1 rail and ascends forward step by step with use of 2 rails. Cont with POC.    Personal Factors and Comorbidities Past/Current Experience;Comorbidity 2;Time since onset of injury/illness/exacerbation;Sex    Comorbidities hypertrophic cardiomyopathy, left ventricular hypertrophy, post laminectomy syndrome, chronic pain syndrome, dyspnea, s/p spinal cord stimulator removal    Examination-Activity Limitations Stand;Locomotion Level;Bend;Squat;Stairs;Transfers;Carry;Reach  Overhead    Examination-Participation Restrictions Cleaning;Community Activity;Meal Prep;Shop;Laundry;Church;Driving    Stability/Clinical Decision Making Evolving/Moderate complexity    Rehab Potential Good    PT Frequency 2x / week    PT Duration 12 weeks    PT Treatment/Interventions ADLs/Self Care Home Management;Neuromuscular re-education;Balance training;Patient/family education;Aquatic Therapy;Scientist, product/process development;Therapeutic exercise;Stair training;Gait training;Orthotic Fit/Training    PT Next Visit Plan issue HEP for LLE strengthening; cont LLE strengthening, SciFit    Consulted and Agree with Plan of Care Patient             Patient will benefit from skilled therapeutic intervention in order to improve the following deficits and impairments:  Pain, Difficulty walking, Decreased strength, Decreased activity tolerance, Abnormal gait, Increased muscle spasms, Decreased endurance, Decreased balance, Decreased coordination, Decreased knowledge of use of DME, Decreased mobility, Decreased safety awareness, Impaired tone  Visit Diagnosis: Other abnormalities of gait and mobility  Muscle weakness (generalized)  Unsteadiness on feet     Problem List Patient Active Problem List   Diagnosis Date Noted   Uncontrolled pain 05/27/2021   Syncope 05/13/2021   Depression with anxiety 05/13/2021   Chronic diastolic CHF (congestive heart failure) ([place]) 05/13/2021   Chronic back pain 05/13/2021   Chest tightness 05/13/2021   Lower abdominal pain 05/13/2021   Syncope and collapse 01/01/2021   Abnormal echocardiogram 01/01/2021   BPPV (benign paroxysmal positional vertigo), left 12/26/2020   Odynophagia    Dysphagia 12/15/2020   Postural dizziness with presyncope 12/14/2020   Lumbar post-laminectomy syndrome    Chronic pain 09/28/2019   Lumbar spinal stenosis    Radicular pain    Gait disorder    Difficulty in urination 07/13/2018   Back pain 07/12/2018    Hyperlipidemia 04/16/2018   GAD (generalized anxiety disorder) 04/16/2018   AKI (acute kidney injury) ([place])    Benign essential HTN    Major depression, recurrent ([place])    Lumbar radiculopathy 03/09/2018   Paraparesis ([place]) 03/09/2018   Essential hypertension    Chronic nonintractable headache    Leukocytosis    Acute blood loss anemia    Postoperative pain    Generalized weakness    Spinal stenosis at L4-L5 level 03/04/2018   Spinal stenosis of lumbar region 12/28/2015   Chest pain 04/16/2015    Jj Enyeart, [place], PT, ATRIC 09/17/2021, 8:29 PM  [facility] [address] [place], [name], [address] Phone: [phone]   Fax:  [phone]  Name: Tashawn Greff MRN: [id number] Date of Birth: 23-Sep-1961

## 2021-09-23 ENCOUNTER — Other Ambulatory Visit (HOSPITAL_COMMUNITY): Payer: Self-pay

## 2021-09-23 ENCOUNTER — Ambulatory Visit: Payer: Self-pay | Admitting: Physical Therapy

## 2021-09-23 ENCOUNTER — Other Ambulatory Visit: Payer: Self-pay | Admitting: Family Medicine

## 2021-09-23 MED ORDER — METHOCARBAMOL 500 MG PO TABS
ORAL_TABLET | ORAL | 0 refills | Status: DC
  Filled 2021-09-23: qty 90, 90d supply, fill #0

## 2021-09-30 ENCOUNTER — Ambulatory Visit: Payer: PPO | Attending: Family Medicine | Admitting: Physical Therapy

## 2021-09-30 ENCOUNTER — Other Ambulatory Visit: Payer: Self-pay

## 2021-09-30 DIAGNOSIS — R2689 Other abnormalities of gait and mobility: Secondary | ICD-10-CM | POA: Insufficient documentation

## 2021-09-30 DIAGNOSIS — R2681 Unsteadiness on feet: Secondary | ICD-10-CM | POA: Diagnosis not present

## 2021-09-30 DIAGNOSIS — M6281 Muscle weakness (generalized): Secondary | ICD-10-CM | POA: Insufficient documentation

## 2021-10-01 NOTE — Therapy (Signed)
[facility] [address] [place], [name], [address] Phone: [phone]   Fax:  [phone]  Physical Therapy Treatment  Patient Details  Name: [name] MRN: [id number] Date of Birth: 10-31-1960 Referring Provider (PT): Loleta Dicker, [place]   Encounter Date: 09/30/2021   PT End of Session - 10/01/21 2035     Visit Number 11    Number of Visits 24    Date for PT Re-Evaluation 10/04/21    Authorization Type HTA medicare    Authorization Time Period 07-11-21 - 10-04-21    Progress Note Due on Visit 10    PT Start Time 1540    PT Stop Time 1630    PT Time Calculation (min) 50 min    Equipment Utilized During Treatment Other (comment)   pool noodle (large)   Activity Tolerance Patient tolerated treatment well   limited by increased headache with sit to Rt sidelying   Behavior During Therapy [facility] for tasks assessed/performed             Past Medical History:  Diagnosis Date   Anxiety    Aortic atherosclerosis ([place]) 04/16/2021   Atypical angina ([place])    Back pain    related to spinal stenosis and disc problem, radiates down left buttocks to leg., weakness occ.   Chest pain    a. 03/2015 Cath: nl cors; b. 03/2021 Cor CTA: Ca2+ = 0. Nl Cors.   Chronic pain syndrome    Dyspnea    GERD (gastroesophageal reflux disease)    Grade I diastolic dysfunction    Headache    Hyperlipidemia    Hypertension    Lumbar post-laminectomy syndrome    LVH (left ventricular hypertrophy) 12/15/2020   a. 11/2020 Echo: EF 65-70%, no rwma, sev asymm LVH with IVSd 1.9 cm. No LVOT obs @ rest. Gr1 DD. Triv MR.   PONV (postoperative nausea and vomiting)    Pulmonary nodules    a. 03/2021 CT Chest: 64mm pulm nodules in bilat lower lobes. F/u 1 yr.   Right foot drop    Syncope    a. 11/2020 Zio: No significant arrhythmias.   Vaginal foreign object    "Uses Femring"    Past Surgical History:  Procedure Laterality Date    ABDOMINAL HYSTERECTOMY     BIOPSY  12/16/2020   Procedure: BIOPSY;  Surgeon: Ladene Artist, MD;  Location: [place];  Service: Endoscopy;;   CARDIAC CATHETERIZATION N/A 04/18/2015   Procedure: Left Heart Cath and Coronary Angiography;  Surgeon: [name], MD;  Location: [place] CV LAB;  Service: Cardiovascular;  Laterality: N/A;   COLONOSCOPY W/ BIOPSIES AND POLYPECTOMY     ESOPHAGOGASTRODUODENOSCOPY (EGD) WITH PROPOFOL N/A 12/16/2020   Procedure: ESOPHAGOGASTRODUODENOSCOPY (EGD) WITH PROPOFOL;  Surgeon: Ladene Artist, MD;  Location: [facility] ENDOSCOPY;  Service: Endoscopy;  Laterality: N/A;   FOOT SURGERY Bilateral    [place] "bunion,bone spur, tendon" (1) -6'16, (1)-10'16   IR EPIDUROGRAPHY  07/21/2018   LUMBAR LAMINECTOMY/DECOMPRESSION MICRODISCECTOMY Bilateral 12/28/2015   Procedure: MICRO LUMBAR DECOMPRESSION L4 - L5 BILATERALLY;  Surgeon: [name], MD;  Location: [facility];  Service: Orthopedics;  Laterality: Bilateral;   LUMBAR LAMINECTOMY/DECOMPRESSION MICRODISCECTOMY Bilateral 03/04/2018   Procedure: Revision of Microlumbar Decompression Bilateral Lumbar Four-Five;  Surgeon: [name], MD;  Location: [place];  Service: Orthopedics;  Laterality: Bilateral;  90 mins   SAVORY DILATION N/A 12/16/2020   Procedure: SAVORY DILATION;  Surgeon: Ladene Artist, MD;  Location:  [facility] ENDOSCOPY;  Service: Endoscopy;  Laterality: N/A;   SPINAL CORD STIMULATOR INSERTION N/A 09/28/2019   Procedure: THORACIC SPINAL CORD STIMULATOR INSERTION;  Surgeon: Melina Schools, MD;  Location: [place];  Service: Orthopedics;  Laterality: N/A;  2.5 hrs   SPINAL CORD STIMULATOR REMOVAL N/A 05/27/2021   Procedure: LUMBAR SPINAL CORD STIMULATOR REMOVAL;  Surgeon: Deetta Perla, MD;  Location: [facility] ORS;  Service: Neurosurgery;  Laterality: N/A;   TUBAL LIGATION     WISDOM TOOTH EXTRACTION     WOUND EXPLORATION N/A 03/04/2018   Procedure: EXPLORATION OF LUMBAR DECOMPRESSION WOUND;  Surgeon: [name],  MD;  Location: [place];  Service: Orthopedics;  Laterality: N/A;    There were no vitals filed for this visit.   Subjective Assessment - 10/01/21 2033     Subjective Pt reports she is feeling better today - was sick last Monday and had to cancel pool session due to having taken pain medication on empty stomach and it made her sick    Pertinent History Spinal cord stimulator insertion on 09-28-19; stimulator removal on 05-27-21 with resultant LLE weakness;  decompression and laminectomy on 12/28/15 at L4-L5  with redo decompression surgery on May 9,2019, post-op paraparesis with surgery to remove small hematoma; 07/12/18 fall at work (involved rolling chair) with hospital admission, transfer to [place] with d/c home 08/03/18; HTN, Bil foot surgery; left ventricular hypertrophy, chronic pain syndrome, dyspnea, hypertrophic cardiomyopathy    Patient Stated Goals "To be able to move left leg like I was able to before I had the surgery on 05-27-21"    Currently in Pain? Yes    Pain Score 2     Pain Location Back    Pain Orientation Lower;Mid    Pain Descriptors / Indicators Aching;Discomfort    Pain Type Chronic pain    Pain Onset More than a month ago    Pain Frequency Intermittent                Aquatic therapy at [facility]  - pool temp 92 degrees   Patient seen for aquatic therapy today.  Treatment took place in water 3.5-4 feet deep depending upon activity.  Pt entered and  Exited the pool via step negotiation with use of bil. Hand rails with CGA.  Pt performed amb. 18' x approx. 8 reps across pool with bil. UE support on pool noodle (large yellow noodle)  Performed side stepping 18'  x 4 reps with bil. UE support on pool noodle with min assist for stabilization by PT  Backwards amb. 18' x 1 rep with UE support on pool noodle with CGA to min assist for stabilization of noodle  Pt performed squats 10 reps bil. LE's with UE support on pool wall (small range)  Pt performed standing marching -  small range - 10 reps each with UE support on PT's forearms  Step ups onto aquatic step with LUE support on pool wall and with RUE PT's HHA  x 10 reps  Pt requires aquatic therapy for the unweighting provided by the buoyancy of the water and needs the viscosity for strengthening LE's and trunk musc.; buoyancy of water needed for spinal decompression for increased tolerance for movement with less pain experienced than with land exercises                               PT Short Term Goals - 10/01/21 2039  PT SHORT TERM GOAL #1   Title Pt will perform sit to stand transfers 5 reps with bil. UE support in </= 26 secs to demo improved LLE strength.    Baseline 3 reps - 32.72 secs with bil. UE support;   1st trial 1.00";  28.28 secs with bil. UE support on 2nd trial    Time 4    Period Weeks    Status On-going    Target Date 09/06/21      PT SHORT TERM GOAL #2   Title Pt will improve TUG score from 39.78 secs to </= 34 secs with rollator to demo improved functional mobility. UPGRADE:  score </= 24.5 secs with rollator    Baseline 39.78 secs with rollator;  28.12 secs with rollator    Time 4    Period Weeks    Status Revised    Target Date 09/06/21      PT SHORT TERM GOAL #3   Title Pt will amb. 4" nonstop with use of rollator with SBA for increased endurance and to demo improved LE strength.    Baseline pt amb. 3" 49 secs with rollator    Time 4    Period Weeks    Status On-going    Target Date 09/06/21      PT SHORT TERM GOAL #4   Title Assess step negotiation with use of handrails.    Time 4    Period Weeks    Status Achieved    Target Date 08/09/21      PT SHORT TERM GOAL #5   Title Pt will stand for 30 secs without UE support with SBA for increased independence with ADL's. UPGRADE - stand for 1.5" without UE support    Time 4    Period Weeks    Status Revised    Target Date 09/06/21      PT SHORT TERM GOAL #6   Title Independent in HEP for  LLE strengthening and balance exercises.    Time 4    Period Weeks    Status Achieved    Target Date 08/09/21               PT Long Term Goals - 10/01/21 2039       PT LONG TERM GOAL #1   Title Pt will amb. short distances, i.e. 80' with use of SPC with SBA.    Time 12    Period Weeks    Status New      PT LONG TERM GOAL #2   Title Improve TUG score to </= 26 secs with use of rollator to demo improved functional mobility and reduced fall risk.    Baseline 39.78 secs with rollator    Time 12    Period Weeks    Status New      PT LONG TERM GOAL #3   Title Pt will increase gait velocity from 1.02 ft/sec to >/= 1.8 ft/sec with rollator for increased gait efficiency.    Baseline 32.16 secs = 1.02 ft/sec with rollator on 07-11-21    Time 12    Period Weeks    Status New      PT LONG TERM GOAL #4   Title Pt will stand for at least 10" with UE support prn for increased independence with ADL's.    Time 12    Period Weeks    Status New      PT LONG TERM GOAL #5   Title Pt will be independent  in HEP for strengthening & balance exercises.    Time 12    Period Weeks    Status New                   Plan - 10/01/21 2036     Clinical Impression Statement Aquatic therapy session focused on strengthening and stretching exercises for bil. LE's and gait training in 4' water depth.  Pt continues to have difficulty descending steps forward (must turn sideways) due to decreased eccentric control Lt quads.  Cont with POC.    Personal Factors and Comorbidities Past/Current Experience;Comorbidity 2;Time since onset of injury/illness/exacerbation;Sex    Comorbidities hypertrophic cardiomyopathy, left ventricular hypertrophy, post laminectomy syndrome, chronic pain syndrome, dyspnea, s/p spinal cord stimulator removal    Examination-Activity Limitations Stand;Locomotion Level;Bend;Squat;Stairs;Transfers;Carry;Reach Overhead    Examination-Participation Restrictions  Cleaning;Community Activity;Meal Prep;Shop;Laundry;Church;Driving    Stability/Clinical Decision Making Evolving/Moderate complexity    Rehab Potential Good    PT Frequency 2x / week    PT Duration 12 weeks    PT Treatment/Interventions ADLs/Self Care Home Management;Neuromuscular re-education;Balance training;Patient/family education;Aquatic Therapy;Scientist, product/process development;Therapeutic exercise;Stair training;Gait training;Orthotic Fit/Training    PT Next Visit Plan issue HEP for LLE strengthening; cont LLE strengthening, SciFit    Consulted and Agree with Plan of Care Patient             Patient will benefit from skilled therapeutic intervention in order to improve the following deficits and impairments:  Pain, Difficulty walking, Decreased strength, Decreased activity tolerance, Abnormal gait, Increased muscle spasms, Decreased endurance, Decreased balance, Decreased coordination, Decreased knowledge of use of DME, Decreased mobility, Decreased safety awareness, Impaired tone  Visit Diagnosis: Other abnormalities of gait and mobility  Muscle weakness (generalized)  Unsteadiness on feet     Problem List Patient Active Problem List   Diagnosis Date Noted   Uncontrolled pain 05/27/2021   Syncope 05/13/2021   Depression with anxiety 05/13/2021   Chronic diastolic CHF (congestive heart failure) ([place]) 05/13/2021   Chronic back pain 05/13/2021   Chest tightness 05/13/2021   Lower abdominal pain 05/13/2021   Syncope and collapse 01/01/2021   Abnormal echocardiogram 01/01/2021   BPPV (benign paroxysmal positional vertigo), left 12/26/2020   Odynophagia    Dysphagia 12/15/2020   Postural dizziness with presyncope 12/14/2020   Lumbar post-laminectomy syndrome    Chronic pain 09/28/2019   Lumbar spinal stenosis    Radicular pain    Gait disorder    Difficulty in urination 07/13/2018   Back pain 07/12/2018   Hyperlipidemia 04/16/2018   GAD (generalized anxiety  disorder) 04/16/2018   AKI (acute kidney injury) ([place])    Benign essential HTN    Major depression, recurrent ([place])    Lumbar radiculopathy 03/09/2018   Paraparesis ([place]) 03/09/2018   Essential hypertension    Chronic nonintractable headache    Leukocytosis    Acute blood loss anemia    Postoperative pain    Generalized weakness    Spinal stenosis at L4-L5 level 03/04/2018   Spinal stenosis of lumbar region 12/28/2015   Chest pain 04/16/2015    [name], [name], PT, [place] 10/01/2021, 8:40 PM  [facility] [address] [place], [name], [address] Phone: [phone]   Fax:  [phone]  Name: [name] MRN: [id number] Date of Birth: [birth year]

## 2021-10-02 ENCOUNTER — Telehealth: Payer: Self-pay

## 2021-10-02 NOTE — Telephone Encounter (Signed)
--  Caller states she has an appointment Friday (follow up for previous issue). She is having shoulder pain and neck. She is not able to use her hand because of the pain, can't sleep. Started Sunday and getting worse. Has back pain meds. Elevation and heat.  10/02/2021 9:16:29 AM See PCP within [facility] [place], RN, Threasa Beards  Comments User: Erik Obey, RN Date/Time [name] Time): 10/02/2021 9:17:07 AM Really wants to wait until call see Mickel Baas on Friday.  User: Erik Obey, RN Date/Time [name] Time): 10/02/2021 9:17:39 AM Tramadol - [place]  Referrals GO TO FACILITY UNDECIDED

## 2021-10-03 NOTE — Progress Notes (Signed)
Chief Complaint  Patient presents with   Follow-up    Essential hypertension   Shoulder Pain    Left shoulder pain that started on Sunday, can barely lift anything, no known injury.    HPI: [name] is a 60 y.o. female, who is here today for chronic disease management.  Last seen on 08/21/21. Hypertension:  Medications: Telmisartan 40 mg daily and carvedilol 25 mg twice daily. BP readings at home: "Good." Side effects:None Since her last visit she had her cardiac MRI, did not meet criteria for hypertrophic cardiomyopathy. Following with cardiologist. Negative for unusual or severe headache, visual changes, exertional chest pain, dyspnea, new focal weakness, or edema.  Lab Results  Component Value Date   CREATININE 1.13 (H) 08/09/2021   BUN 11 08/09/2021   NA 143 08/09/2021   K 4.1 08/09/2021   CL 104 08/09/2021   CO2 24 08/09/2021   Anxiety: Currently she is on duloxetine 60 mg daily, BuSpar 5 mg twice daily (added last visit), and alprazolam 0.5 mg 1/2 tablet 1 tablet daily as needed. She feels like problem has improved. She has not noticed side effects from medications.  Today she is c/o severe left UE pain as described above (CC), she woke up with throbbing pain about 5 days ago. Pain is constant. Exacerbated by movement and alleviated by rest. Problem is getting worse. She has tramadol and Percocet at home, she is not taking either one because she does not like the way she feels when she takes medication (sleepy) and she is not sure if it helps with pain.  Reporting this as a new problem. Hx of lumbar radiculopathy and chronic pain, she follows with Dr [name] and has an appt on 10/10/21.  Initially left shoulder pain was radiated to left-sided neck and upper back but now it is going down her LUE to hand. Having numbness and tingling of UE, whole arm. She has not noted fever, chills, sore throat, joint edema/erythema, or a skin rash.  Review of  Systems  Constitutional:  Positive for fatigue. Negative for appetite change.  Respiratory:  Negative for cough and wheezing.   Gastrointestinal:  Negative for abdominal pain, nausea and vomiting.  Musculoskeletal:  Positive for arthralgias, back pain and gait problem.  Neurological:  Negative for syncope and facial asymmetry.  Psychiatric/Behavioral:  Positive for sleep disturbance. Negative for confusion. The patient is nervous/anxious.   Rest of ROS see pertinent positives and negatives in HPI.  Current Outpatient Medications on File Prior to Visit  Medication Sig Dispense Refill   ALPRAZolam (XANAX) 0.5 MG tablet Take 1/2 to 1 tablet (0.25-0.5 mg total) by mouth at bedtime as needed for anxiety. 30 tablet 1   atorvastatin (LIPITOR) 20 MG tablet TAKE 1 TABLET BY MOUTH ONCE DAILY 90 tablet 2   baclofen (LIORESAL) 10 MG tablet TAKE 1 TABLET BY MOUTH THREE TIMES DAILY AS NEEDED 90 tablet 3   carvedilol (COREG) 25 MG tablet Take 2 tablets (50 mg total) by mouth 2 (two) times daily with a meal. 360 tablet 3   cholecalciferol (VITAMIN D3) 25 MCG (1000 UNIT) tablet Take 1,000 Units by mouth daily.     diphenhydrAMINE (BENADRYL) 25 MG tablet Take 25 mg by mouth daily as needed for itching.     DULoxetine (CYMBALTA) 60 MG capsule Take 1 capsule (60 mg total) by mouth daily. 90 capsule 2   Estradiol Acetate (FEMRING) 0.05 MG/24HR RING USE ONE RING VAGINALLY EVERY 3 MONTHS AS DIRECTED 1  each 2   gabapentin (NEURONTIN) 400 MG capsule Take 1 capsule by mouth three times daily. 90 capsule 3   meclizine (ANTIVERT) 25 MG tablet TAKE 1 TABLET BY MOUTH 2 TIMES DAILY AS NEEDED FOR DIZZINESS 20 tablet 0   methocarbamol (ROBAXIN) 500 MG tablet TAKE 1 TABLET BY MOUTH AT BEDTIME AS NEEDED 90 tablet 0   senna-docusate (SENOKOT-S) 8.6-50 MG tablet Take 1 tablet by mouth 2 (two) times daily. 60 tablet 0   telmisartan (MICARDIS) 40 MG tablet Take 1 tablet (40 mg total) by mouth daily. 90 tablet 3   traMADol (ULTRAM)  50 MG tablet Take 50 mg by mouth every 6 (six) hours as needed.     oxyCODONE-acetaminophen (PERCOCET) 7.5-325 MG tablet Take 1 tablet by mouth every 6 (six) hours as needed for severe pain.     No current facility-administered medications on file prior to visit.    Past Medical History:  Diagnosis Date   Anxiety    Aortic atherosclerosis ([place]) 04/16/2021   Atypical angina ([facility])    Back pain    related to spinal stenosis and disc problem, radiates down left buttocks to leg., weakness occ.   Chest pain    a. 03/2015 Cath: nl cors; b. 03/2021 Cor CTA: Ca2+ = 0. Nl Cors.   Chronic pain syndrome    Dyspnea    GERD (gastroesophageal reflux disease)    Grade I diastolic dysfunction    Headache    Hyperlipidemia    Hypertension    Lumbar post-laminectomy syndrome    LVH (left ventricular hypertrophy) 12/15/2020   a. 11/2020 Echo: EF 65-70%, no rwma, sev asymm LVH with IVSd 1.9 cm. No LVOT obs @ rest. Gr1 DD. Triv MR.   PONV (postoperative nausea and vomiting)    Pulmonary nodules    a. 03/2021 CT Chest: 21mm pulm nodules in bilat lower lobes. F/u 1 yr.   Right foot drop    Syncope    a. 11/2020 Zio: No significant arrhythmias.   Vaginal foreign object    "Uses Femring"   Allergies  Allergen Reactions   Cephalosporins Anaphylaxis   Penicillins Anaphylaxis and Hives    Did it involve swelling of the face/tongue/throat, SOB, or low BP? Yes Did it involve sudden or severe rash/hives, skin peeling, or any reaction on the inside of your mouth or nose? Yes Did you need to seek medical attention at a hospital or doctor's office? Yes When did it last happen?      10+ years If all above answers are "NO", may proceed with cephalosporin use.     Anesthetics, Amide Nausea And Vomiting    Does not know name of it. States they put it on record foot center.    Betadine [Povidone Iodine] Other (See Comments)    "Skin Burn" caused scar on Left buttock   Latex Hives, Itching and Rash   Pe[place]  [Prunus Persica] Hives   Claritin [Loratadine]     Rash, pruritis     Social History   Socioeconomic History   Marital status: Married    Spouse name: Not on file   Number of children: Not on file   Years of education: Not on file   Highest education level: Not on file  Occupational History   Not on file  Tobacco Use   Smoking status: Never   Smokeless tobacco: Never  Vaping Use   Vaping Use: Never used  Substance and Sexual Activity   Alcohol use: No  Drug use: No   Sexual activity: Yes    Partners: Male    Birth control/protection: Post-menopausal  Other Topics Concern   Not on file  Social History Narrative   Not on file   Social Determinants of Health   Financial Resource Strain: Low Risk    Difficulty of Paying Living Expenses: Not hard at all  Food Insecurity: No Food Insecurity   Worried About Charity fundraiser in the Last Year: Never true   [place] in the Last Year: Never true  Transportation Needs: No Transportation Needs   Lack of Transportation (Medical): No   Lack of Transportation (Non-Medical): No  Physical Activity: Insufficiently Active   Days of Exercise per Week: 2 days   Minutes of Exercise per Session: 40 min  Stress: No Stress Concern Present   Feeling of Stress : Not at all  Social Connections: Socially Integrated   Frequency of Communication with Friends and Family: More than three times a week   Frequency of Social Gatherings with Friends and Family: Twice a week   Attends Religious Services: 1 to 4 times per year   Active Member of [employer] or Organizations: Yes   Attends Archivist Meetings: 1 to 4 times per year   Marital Status: Married   Vitals:   10/04/21 0834  BP: 128/80  Pulse: 78  Resp: 16  SpO2: 98%   Body mass index is 27.96 kg/m.  Physical Exam Vitals and nursing note reviewed.  Constitutional:      General: She is not in acute distress.    Appearance: She is well-developed.  HENT:     Head:  Normocephalic and atraumatic.     Mouth/Throat:     Mouth: Mucous membranes are moist.     Pharynx: Oropharynx is clear.  Eyes:     Conjunctiva/sclera: Conjunctivae normal.  Cardiovascular:     Rate and Rhythm: Normal rate and regular rhythm.     Heart sounds: No murmur heard. Pulmonary:     Effort: Pulmonary effort is normal. No respiratory distress.     Breath sounds: Normal breath sounds.  Abdominal:     Palpations: Abdomen is soft.     Tenderness: There is no abdominal tenderness.  Musculoskeletal:     Right shoulder: Tenderness (with light touch of lateral,anterior,and posterior area) present. No deformity, effusion or crepitus. Decreased range of motion (Marked, movement elicits pain.). Normal pulse.     Cervical back: Tenderness present. Pain with movement present. Decreased range of motion (Mildly, extension.).       Back:     Comments: I do not appreciate edema or erythema of left shoulder.  Lymphadenopathy:     Cervical: No cervical adenopathy.  Skin:    General: Skin is warm.     Findings: No erythema or rash.  Neurological:     Mental Status: She is alert and oriented to person, place, and time.     Cranial Nerves: No cranial nerve deficit.     Gait: Gait normal.     Comments: Gait assisted by a walker, mildly unstable.  Psychiatric:        Mood and Affect: Mood is anxious.   ASSESSMENT AND PLAN:  [name] was seen today for follow-up and shoulder pain.  Diagnoses and all orders for this visit: Orders Placed This Encounter  Procedures   DG Shoulder Left   DG Cervical Spine Complete   Acute pain of left shoulder Severe, she does not feel  like Tramadol or Percocet help. We discussed other options, she agrees with trying Celebrex, we discussed some side effects. She is keeping appt with chronic pain management/ortho, 10/10/21.  -     celecoxib (CELEBREX) 100 MG capsule; Take 1 capsule by mouth 2 times daily for 7 days.  Numbness and tingling of left upper  extremity ? Cervical radiculopathy. We discussed some side effects of Prednisone, recommend taking med with breakfast. Instructed about warning signs. May need cervical MRI.  -     predniSONE (DELTASONE) 20 MG tablet; Take 3 tabs by mouth for 3 days, 2 tabs by mouth for 3 days, 1 tabs by mouth for 3 days, and 1/2 tab by mouth for 3 days. Take tablets together with breakfast.  Neck pain, acute She would like X ray done. Celebrex 100 mg bid x 7 d. Follow with ortho.  -     celecoxib (CELEBREX) 100 MG capsule; Take 1 capsule by mouth 2 times daily for 7 days.  Essential hypertension BP is adequately controlled. Continue Micardis 40 mg daily and carvedilol 25 mg bid. Continue low salt diet and monitoring BP at home.  GAD (generalized anxiety disorder) Problem has improved since last visit. Continue Buspar 5 mg bid, Duloxetine 60 mg daily,and Alprazolam 0.5 mg 0.5-1 tab daily as needed.  Return in about 6 months (around 04/04/2022).  Ioma Chismar G. [name], MD  [facility]. [place] office.

## 2021-10-04 ENCOUNTER — Other Ambulatory Visit (HOSPITAL_COMMUNITY): Payer: Self-pay

## 2021-10-04 ENCOUNTER — Other Ambulatory Visit: Payer: Self-pay

## 2021-10-04 ENCOUNTER — Ambulatory Visit (INDEPENDENT_AMBULATORY_CARE_PROVIDER_SITE_OTHER): Payer: PPO | Admitting: Family Medicine

## 2021-10-04 ENCOUNTER — Encounter: Payer: Self-pay | Admitting: Family Medicine

## 2021-10-04 ENCOUNTER — Ambulatory Visit (INDEPENDENT_AMBULATORY_CARE_PROVIDER_SITE_OTHER): Payer: PPO

## 2021-10-04 VITALS — BP 128/80 | HR 78 | Resp 16 | Ht 65.0 in

## 2021-10-04 DIAGNOSIS — R202 Paresthesia of skin: Secondary | ICD-10-CM

## 2021-10-04 DIAGNOSIS — M542 Cervicalgia: Secondary | ICD-10-CM | POA: Diagnosis not present

## 2021-10-04 DIAGNOSIS — I10 Essential (primary) hypertension: Secondary | ICD-10-CM | POA: Diagnosis not present

## 2021-10-04 DIAGNOSIS — M47812 Spondylosis without myelopathy or radiculopathy, cervical region: Secondary | ICD-10-CM | POA: Diagnosis not present

## 2021-10-04 DIAGNOSIS — M25512 Pain in left shoulder: Secondary | ICD-10-CM

## 2021-10-04 DIAGNOSIS — F411 Generalized anxiety disorder: Secondary | ICD-10-CM

## 2021-10-04 DIAGNOSIS — M19012 Primary osteoarthritis, left shoulder: Secondary | ICD-10-CM | POA: Diagnosis not present

## 2021-10-04 DIAGNOSIS — R200 Anesthesia of skin: Secondary | ICD-10-CM | POA: Diagnosis not present

## 2021-10-04 MED ORDER — CELECOXIB 100 MG PO CAPS
100.0000 mg | ORAL_CAPSULE | Freq: Two times a day (BID) | ORAL | 0 refills | Status: AC
Start: 2021-10-04 — End: 2021-10-11
  Filled 2021-10-04: qty 14, 7d supply, fill #0

## 2021-10-04 MED ORDER — BUSPIRONE HCL 5 MG PO TABS
5.0000 mg | ORAL_TABLET | Freq: Two times a day (BID) | ORAL | 1 refills | Status: DC
  Filled 2021-10-04: qty 180, 90d supply, fill #0

## 2021-10-04 MED ORDER — PREDNISONE 20 MG PO TABS
ORAL_TABLET | ORAL | 0 refills | Status: AC
  Filled 2021-10-04: qty 20, 12d supply, fill #0

## 2021-10-04 NOTE — Assessment & Plan Note (Signed)
BP is adequately controlled. Continue Micardis 40 mg daily and carvedilol 25 mg bid. Continue low salt diet and monitoring BP at home.

## 2021-10-04 NOTE — Assessment & Plan Note (Signed)
Problem has improved since last visit. Continue Buspar 5 mg bid, Duloxetine 60 mg daily,and Alprazolam 0.5 mg 0.5-1 tab daily as needed.

## 2021-10-04 NOTE — Patient Instructions (Addendum)
A few things to remember from today's visit:   Acute pain of left shoulder - Plan: DG Shoulder Left, celecoxib (CELEBREX) 100 MG capsule  Essential hypertension  Numbness and tingling of left upper extremity - Plan: predniSONE (DELTASONE) 20 MG tablet  Neck pain, acute - Plan: DG Cervical Spine Complete, celecoxib (CELEBREX) 100 MG capsule  GAD (generalized anxiety disorder)  Anxiety disorder, unspecified type - Plan: busPIRone (BUSPAR) 5 MG tablet  If you need refills please call your pharmacy. Do not use My Chart to request refills or for acute issues that need immediate attention.   Prednisone recommended because left arm pain can be a pinched nerve.Take med with breakfast. Celebrex for 7 days, do not take at the same time that prednisone. Range of motion exercises. If suddenly worse, you need to seek immediate medical attention. Keep appt with Dr Nelva Bush' office.  Please be sure medication list is accurate. If a new problem present, please set up appointment sooner than planned today.

## 2021-10-10 DIAGNOSIS — M5412 Radiculopathy, cervical region: Secondary | ICD-10-CM | POA: Diagnosis not present

## 2021-10-10 DIAGNOSIS — M25512 Pain in left shoulder: Secondary | ICD-10-CM | POA: Diagnosis not present

## 2021-10-14 ENCOUNTER — Ambulatory Visit: Payer: PPO | Admitting: Physical Therapy

## 2021-10-14 ENCOUNTER — Other Ambulatory Visit (HOSPITAL_COMMUNITY): Payer: Self-pay

## 2021-10-14 ENCOUNTER — Other Ambulatory Visit: Payer: Self-pay

## 2021-10-14 DIAGNOSIS — R2689 Other abnormalities of gait and mobility: Secondary | ICD-10-CM | POA: Diagnosis not present

## 2021-10-14 DIAGNOSIS — M6281 Muscle weakness (generalized): Secondary | ICD-10-CM

## 2021-10-14 DIAGNOSIS — R2681 Unsteadiness on feet: Secondary | ICD-10-CM

## 2021-10-15 ENCOUNTER — Encounter: Payer: Self-pay | Admitting: Physical Therapy

## 2021-10-15 NOTE — Therapy (Signed)
[name] [address] [place], [name], [address] Phone: [phone]   Fax:  [phone]  Physical Therapy Treatment  Patient Details  Name: [name] MRN: [id number] Date of Birth: 1961-04-29 Referring Provider (PT): [name], [place]   Encounter Date: 10/14/2021   PT End of Session - 10/15/21 1946     Visit Number 12    Number of Visits 15    Date for PT Re-Evaluation 11/29/21   treatment to resume 11-04-21 due to holiday schedule   Authorization Type [employer] medicare    Authorization Time Period 07-11-21 - 10-04-21; 10-14-21 - 11-29-21    Progress Note Due on Visit 20    PT Start Time 1550    PT Stop Time 1645    PT Time Calculation (min) 55 min    Equipment Utilized During Treatment Other (comment)   pool noodle (small) and bar bells   Activity Tolerance Patient tolerated treatment well   limited by increased headache with sit to Rt sidelying   Behavior During Therapy [facility] for tasks assessed/performed             Past Medical History:  Diagnosis Date   Anxiety    Aortic atherosclerosis ([place]) 04/16/2021   Atypical angina ([place])    Back pain    related to spinal stenosis and disc problem, radiates down left buttocks to leg., weakness occ.   Chest pain    a. 03/2015 Cath: nl cors; b. 03/2021 Cor CTA: Ca2+ = 0. Nl Cors.   Chronic pain syndrome    Dyspnea    GERD (gastroesophageal reflux disease)    Grade I diastolic dysfunction    Headache    Hyperlipidemia    Hypertension    Lumbar post-laminectomy syndrome    LVH (left ventricular hypertrophy) 12/15/2020   a. 11/2020 Echo: EF 65-70%, no rwma, sev asymm LVH with IVSd 1.9 cm. No LVOT obs @ rest. Gr1 DD. Triv MR.   PONV (postoperative nausea and vomiting)    Pulmonary nodules    a. 03/2021 CT Chest: 40mm pulm nodules in bilat lower lobes. F/u 1 yr.   Right foot drop    Syncope    a. 11/2020 Zio: No significant arrhythmias.   Vaginal foreign  object    "Uses Femring"    Past Surgical History:  Procedure Laterality Date   ABDOMINAL HYSTERECTOMY     BIOPSY  12/16/2020   Procedure: BIOPSY;  Surgeon: Cheryl Artist, MD;  Location: [facility];  Service: Endoscopy;;   CARDIAC CATHETERIZATION N/A 04/18/2015   Procedure: Left Heart Cath and Coronary Angiography;  Surgeon: Cheryl Forward, MD;  Location: [facility] CV [facility];  Service: Cardiovascular;  Laterality: N/A;   COLONOSCOPY W/ BIOPSIES AND POLYPECTOMY     ESOPHAGOGASTRODUODENOSCOPY (EGD) WITH PROPOFOL N/A 12/16/2020   Procedure: ESOPHAGOGASTRODUODENOSCOPY (EGD) WITH PROPOFOL;  Surgeon: Cheryl Artist, MD;  Location: [facility] ENDOSCOPY;  Service: Endoscopy;  Laterality: N/A;   FOOT SURGERY Bilateral    [place] "bunion,bone spur, tendon" (1) -6'16, (1)-10'16   IR EPIDUROGRAPHY  07/21/2018   LUMBAR LAMINECTOMY/DECOMPRESSION MICRODISCECTOMY Bilateral 12/28/2015   Procedure: MICRO LUMBAR DECOMPRESSION L4 - L5 BILATERALLY;  Surgeon: Cheryl Day, MD;  Location: [facility];  Service: Orthopedics;  Laterality: Bilateral;   LUMBAR LAMINECTOMY/DECOMPRESSION MICRODISCECTOMY Bilateral 03/04/2018   Procedure: Revision of Microlumbar Decompression Bilateral Lumbar Four-Five;  Surgeon: Cheryl Day, MD;  Location: [facility];  Service: Orthopedics;  Laterality: Bilateral;  90 mins   SAVORY DILATION  N/A 12/16/2020   Procedure: SAVORY DILATION;  Surgeon: Cheryl Artist, MD;  Location: [facility] ENDOSCOPY;  Service: Endoscopy;  Laterality: N/A;   SPINAL CORD STIMULATOR INSERTION N/A 09/28/2019   Procedure: THORACIC SPINAL CORD STIMULATOR INSERTION;  Surgeon: Cheryl Schools, MD;  Location: [facility];  Service: Orthopedics;  Laterality: N/A;  2.5 hrs   SPINAL CORD STIMULATOR REMOVAL N/A 05/27/2021   Procedure: LUMBAR SPINAL CORD STIMULATOR REMOVAL;  Surgeon: Cheryl Perla, MD;  Location: [facility];  Service: Neurosurgery;  Laterality: N/A;   TUBAL LIGATION     WISDOM TOOTH EXTRACTION     WOUND EXPLORATION N/A 03/04/2018    Procedure: EXPLORATION OF LUMBAR DECOMPRESSION WOUND;  Surgeon: Cheryl Day, MD;  Location: [facility];  Service: Orthopedics;  Laterality: N/A;    There were no vitals filed for this visit.   Subjective Assessment - 10/15/21 1940     Subjective Pt reports she was having Lt shoulder pain last Monday which got worse during the week; pt saw PA last Thursday and was diagnosed with cervical radiculopathy - pt is going to have injection; pt was prescribed Prednisone which has helped some - states the pain is better than it was last Thursday    Pertinent History Spinal cord stimulator insertion on 09-28-19; stimulator removal on 05-27-21 with resultant LLE weakness;  decompression and laminectomy on 12/28/15 at L4-L5  with redo decompression surgery on May 9,2019, post-op paraparesis with surgery to remove small hematoma; 07/12/18 fall at work (involved rolling chair) with hospital admission, transfer to [facility] with d/c home 08/03/18; HTN, Bil foot surgery; left ventricular hypertrophy, chronic pain syndrome, dyspnea, hypertrophic cardiomyopathy    Patient Stated Goals "To be able to move left leg like I was able to before I had the surgery on 05-27-21"    Currently in Pain? Yes    Pain Score 7     Pain Location Shoulder    Pain Orientation Left    Pain Descriptors / Indicators Aching;Dull;Constant;Discomfort;Radiating    Pain Type Acute pain    Pain Onset In the past 7 days    Pain Frequency Constant                Aquatic therapy at [facility]  - pool temp 90 degrees   Patient seen for aquatic therapy today.  Treatment took place in water 3.5-4 feet deep depending upon activity.  Pt entered and  Exited the pool via step negotiation with use of bil. Hand rails with SBA  Pt performed amb. 18' x approx. 10 reps across pool with bil. UE support on pool noodle  Performed side stepping 18'  x 4 reps with bil. UE support on pool noodle with min assist for stabilization by PT; backwards amb. 18' x 1 rep  with CGA with UE support on noodle  Pt performed squats 10 reps bil. LE's with UE support on pool wall (small range)  Pt performed small range marching in place 10 reps each leg  Performed [name], back and side kicks 10 reps each leg with bil. UE support on pool edge  Pt performed buoyancy assisted Lt shoulder ROM exercises - flexion, abduction and extension 10 reps each to assist with reducing Lt shoulder and arm pain   Pt requires aquatic therapy for the unweighting provided by the buoyancy of the water and needs the viscosity for strengthening LE's and trunk musc.; buoyancy of water needed for spinal decompression for increased tolerance for movement with less pain experienced than with land exercises  PT Short Term Goals - 10/15/21 2005       PT SHORT TERM GOAL #1   Title Pt will perform sit to stand transfers 5 reps with bil. UE support in </= 26 secs to demo improved LLE strength.    Baseline 3 reps - 32.72 secs with bil. UE support;   1st trial 1.00";  28.28 secs with bil. UE support on 2nd trial    Time 4    Period Weeks    Status On-going    Target Date 09/06/21      PT SHORT TERM GOAL #2   Title Pt will improve TUG score from 39.78 secs to </= 34 secs with rollator to demo improved functional mobility. UPGRADE:  score </= 24.5 secs with rollator    Baseline 39.78 secs with rollator;  28.12 secs with rollator    Time 4    Period Weeks    Status Revised    Target Date 09/06/21      PT SHORT TERM GOAL #3   Title Pt will amb. 4" nonstop with use of rollator with SBA for increased endurance and to demo improved LE strength.    Baseline pt amb. 3" 49 secs with rollator    Time 4    Period Weeks    Status On-going    Target Date 09/06/21      PT SHORT TERM GOAL #4   Title Assess step negotiation with use of handrails.    Time 4    Period Weeks    Status Achieved    Target Date 08/09/21      PT SHORT TERM GOAL  #5   Title Pt will stand for 30 secs without UE support with SBA for increased independence with ADL's. UPGRADE - stand for 1.5" without UE support    Time 4    Period Weeks    Status Revised    Target Date 09/06/21      PT SHORT TERM GOAL #6   Title Independent in HEP for LLE strengthening and balance exercises.    Time 4    Period Weeks    Status Achieved    Target Date 08/09/21               PT Long Term Goals - 10/15/21 1957       PT LONG TERM GOAL #1   Title Pt will amb. short distances, i.e. 53' with use of SPC with SBA.    Baseline Pt continues to use rollator for assistance with ambulation to pool area due to distance of  200' from parking lot to pool area    Time 12    Period Weeks    Status Not Met      PT LONG TERM GOAL #2   Title Improve TUG score to </= 26 secs with use of rollator to demo improved functional mobility and reduced fall risk.    Baseline 39.78 secs with rollator; goal deferred as pt requested to do aquatic therapy only    Time 12    Period Weeks    Status Deferred      PT LONG TERM GOAL #3   Title Pt will increase gait velocity from 1.02 ft/sec to >/= 1.8 ft/sec with rollator for increased gait efficiency.    Baseline 32.16 secs = 1.02 ft/sec with rollator on 07-11-21; unable to assess as pt is receiving aquatic therapy only - 10-14-21    Time 12    Period Weeks  Status Deferred      PT LONG TERM GOAL #4   Title Pt will stand for at least 10" with UE support prn for increased independence with ADL's.    Baseline met 10-14-21    Time 12    Period Weeks    Status Achieved      PT LONG TERM GOAL #5   Title Pt will be independent in HEP for strengthening & balance exercises.    Baseline met 10-14-21    Time 12    Period Weeks    Status Achieved                   Plan - 10/15/21 1950     Clinical Impression Statement Pt has met LTG's #4 and 5;  goal #1 not met as pt continues to use rollator for assistance with  ambulation.  Aquatic therapy session focused on gait training in 4' water depth, bil. LE strengthening and Lt shoulder AROM with use of viscosity for resistance for strengthening.  Pt able to perform Lt shoulder flexion/extension and abduction with use of bouyancy assisted exercise for increased shoulder ROM.  Pt stated these exercises felt good without increasing Lt shoulder pain.  Cont with POC.    Personal Factors and Comorbidities Past/Current Experience;Comorbidity 2;Time since onset of injury/illness/exacerbation;Sex    Comorbidities hypertrophic cardiomyopathy, left ventricular hypertrophy, post laminectomy syndrome, chronic pain syndrome, dyspnea, s/p spinal cord stimulator removal    Examination-Activity Limitations Stand;Locomotion Level;Bend;Squat;Stairs;Transfers;Carry;Reach Overhead    Examination-Participation Restrictions Cleaning;Community Activity;Meal Prep;Shop;Laundry;Church;Driving    Stability/Clinical Decision Making Evolving/Moderate complexity    Rehab Potential Good    PT Frequency 1x / week    PT Duration 4 weeks   4 additional aquatic therapy sessions requested   PT Treatment/Interventions ADLs/Self Care Home Management;Neuromuscular re-education;Balance training;Patient/family education;Aquatic Therapy;Psychologist, educational;Therapeutic exercise;Stair training;Gait training;Orthotic Fit/Training    PT Next Visit Plan Cont with aquatic therapy for bil. LE strengthening    Consulted and Agree with Plan of Care Patient             Patient will benefit from skilled therapeutic intervention in order to improve the following deficits and impairments:  Pain, Difficulty walking, Decreased strength, Decreased activity tolerance, Abnormal gait, Increased muscle spasms, Decreased endurance, Decreased balance, Decreased coordination, Decreased knowledge of use of DME, Decreased mobility, Decreased safety awareness, Impaired tone  Visit Diagnosis: Other  abnormalities of gait and mobility - Plan: PT plan of care cert/re-cert  Muscle weakness (generalized) - Plan: PT plan of care cert/re-cert  Unsteadiness on feet - Plan: PT plan of care cert/re-cert     Problem List Patient Active Problem List   Diagnosis Date Noted   Uncontrolled pain 05/27/2021   Syncope 05/13/2021   Depression with anxiety 05/13/2021   Chronic diastolic CHF (congestive heart failure) (HCC) 05/13/2021   Chronic back pain 05/13/2021   Chest tightness 05/13/2021   Lower abdominal pain 05/13/2021   Syncope and collapse 01/01/2021   Abnormal echocardiogram 01/01/2021   BPPV (benign paroxysmal positional vertigo), left 12/26/2020   Odynophagia    Dysphagia 12/15/2020   Postural dizziness with presyncope 12/14/2020   Lumbar post-laminectomy syndrome    Chronic pain 09/28/2019   Lumbar spinal stenosis    Radicular pain    Gait disorder    Difficulty in urination 07/13/2018   Back pain 07/12/2018   Hyperlipidemia 04/16/2018   GAD (generalized anxiety disorder) 04/16/2018   AKI (acute kidney injury) (HCC)    Benign essential HTN  Major depression, recurrent ([place])    Lumbar radiculopathy 03/09/2018   Paraparesis ([place]) 03/09/2018   Essential hypertension    Chronic nonintractable headache    Leukocytosis    Acute blood loss anemia    Postoperative pain    Generalized weakness    Spinal stenosis at L4-L5 level 03/04/2018   Spinal stenosis of lumbar region 12/28/2015   Chest pain 04/16/2015    Alda Lea, PT 10/15/2021, 8:13 PM  [facility] [address] [place], [name], [address] Phone: [phone]   Fax:  [phone]  Name: [name] MRN: [id number] Date of Birth: 01/19/1961

## 2021-10-17 DIAGNOSIS — M5412 Radiculopathy, cervical region: Secondary | ICD-10-CM | POA: Diagnosis not present

## 2021-10-30 ENCOUNTER — Ambulatory Visit (HOSPITAL_BASED_OUTPATIENT_CLINIC_OR_DEPARTMENT_OTHER): Payer: PPO | Admitting: Internal Medicine

## 2021-10-30 ENCOUNTER — Encounter (HOSPITAL_BASED_OUTPATIENT_CLINIC_OR_DEPARTMENT_OTHER): Payer: Self-pay | Admitting: Internal Medicine

## 2021-10-30 ENCOUNTER — Other Ambulatory Visit: Payer: Self-pay

## 2021-10-30 VITALS — BP 138/76 | HR 84 | Ht 65.0 in | Wt 168.0 lb

## 2021-10-30 DIAGNOSIS — I10 Essential (primary) hypertension: Secondary | ICD-10-CM

## 2021-10-30 DIAGNOSIS — R0602 Shortness of breath: Secondary | ICD-10-CM | POA: Diagnosis not present

## 2021-10-30 NOTE — Progress Notes (Signed)
OFFICE NOTE  Chief Complaint:  Follow-up chest pain, dyspnea  Primary Care Physician: [name], [name], MD  HPI:  [name] is a 61 y.o. female with a past medial history significant for hypertension, dyslipidemia, lumbar back pain, anxiety and echo evidence of possible hypertrophic cardiomyopathy.  I had seen her in the past in the hospital but basically she has been followed by our APP's in the office until recently.  She had an echo in February which showed dynamic LVEF 65 to 70% with severe asymmetric left ventricular hypertrophy and a septal dimension of 1.9 cm.  I had recommended a cardiac MRI however this was not performed due to the fact that she has spinal stimulator.  Subsequently she had another echo 2 months later that I did not order but was interpreted as having normal septal thickness.  I personally reviewed the echo and disagree with that finding.  The septum is completely mis-measured.  It actually does measure about 2 cm.  This is consistent with prior findings.  There is family history concerning for hypertrophic cardiomyopathy.  We discussed that in more detail today.  She does have uncontrolled hypertension as well and blood pressure was quite high today 174/102.  She seems to be tolerating carvedilol which was started in the hospital and likely would benefit from more.  With regards to her spinal stimulator, her orthopedic spine surgeon recommended that she could either have the leads replaced with MRI compatible or perhaps have it removed.  She says it actually does not seem to have helped her very much and she is considering having it removed.  03/27/2021  [name] returns today for follow-up.  She continues to have concerns about chest discomfort.  We had discussed pursuing a possible cardiac MRI to evaluate for cardiomyopathy although I do not think these are causing her symptoms.  The issue is that she would have to have her spinal stimulator lead  removed and her orthospine surgeon did not want to do this.  She is sought out another opinion from and may have that performed at an outside hospital.  Blood pressure looks like it continues to be an issue and this may be actually causing a lot of her symptoms.  Of course she could have coronary disease that we have not picked up on as well.  07/10/2021  [name] returns today for follow-up.  Recently she had removal of a spinal stimulator.  She had some difficult recovery from this including a lot of back pain but it seems to be improving.  She was noted to have significant asymmetric septal hypertrophy measuring 1.9 cm by echo in February 2022.  There was no evidence of significant outflow gradient.  Subsequently she had a repeat echo in April.  The septum was measured to 0.65 cm which is surprisingly low and I then personally reviewed the images and noted that it has been missed measured.  The septum actually measures about 1.85 cm which is more consistent with her prior echo.  I suspect that she does have hypertrophic cardiomyopathy.  There is a family history of this and her brother who actually died with it.  He was advised to have septal myomectomy but declined the surgery.  She continues to have some chest discomfort although coronary CT angiography was performed in June 2022 which demonstrated no coronary calcium and no obstructive coronary disease.  We had discussed the definitive cardiac MRI and she said that she is now interested in  doing that since her spinal stimulator leads have been removed.  She did recently have successful MRI of her back.  10/30/2021  [name] returns today for follow-up.  She underwent cardiac MRI on October 31st 2022 which demonstrated mild asymmetric basal septal hypertrophy measuring only 10 mm.  There was an anomalous cord inserting at the basal septum which likely was measured in addition to the septum to provide a larger measurement on echo.   Overall it is not thought that there is evidence for any hypertrophic cardiomyopathy based on this MRI.  I think this is reassuring and the fact that she had no coronary disease as well by CT makes her dyspnea which is still somewhat persistent, unlikely to be cardiac in origin.  I suspect there may be some degree of deconditioning including issues with chronic pain and having to use a walker.  PMHx:  Past Medical History:  Diagnosis Date   Anxiety    Aortic atherosclerosis ([place]) 04/16/2021   Atypical angina ([facility])    Back pain    related to spinal stenosis and disc problem, radiates down left buttocks to leg., weakness occ.   Chest pain    a. 03/2015 Cath: nl cors; b. 03/2021 Cor CTA: Ca2+ = 0. Nl Cors.   Chronic pain syndrome    Dyspnea    GERD (gastroesophageal reflux disease)    Grade I diastolic dysfunction    Headache    Hyperlipidemia    Hypertension    Lumbar post-laminectomy syndrome    LVH (left ventricular hypertrophy) 12/15/2020   a. 11/2020 Echo: EF 65-70%, no rwma, sev asymm LVH with IVSd 1.9 cm. No LVOT obs @ rest. Gr1 DD. Triv MR.   PONV (postoperative nausea and vomiting)    Pulmonary nodules    a. 03/2021 CT Chest: 56m pulm nodules in bilat lower lobes. F/u 1 yr.   Right foot drop    Syncope    a. 11/2020 Zio: No significant arrhythmias.   Vaginal foreign object    "Uses Femring"    Past Surgical History:  Procedure Laterality Date   ABDOMINAL HYSTERECTOMY     BIOPSY  12/16/2020   Procedure: BIOPSY;  Surgeon: SLadene Artist MD;  [facility]  Service: Endoscopy;;   CARDIAC CATHETERIZATION N/A 04/18/2015   Procedure: Left Heart Cath and Coronary Angiography;  Surgeon: MCharolette Forward MD;  [facility];  Service: Cardiovascular;  Laterality: N/A;   COLONOSCOPY W/ BIOPSIES AND POLYPECTOMY     ESOPHAGOGASTRODUODENOSCOPY (EGD) WITH PROPOFOL N/A 12/16/2020   Procedure: ESOPHAGOGASTRODUODENOSCOPY (EGD) WITH PROPOFOL;  Surgeon: SLadene Artist  MD;  [facility];  Service: Endoscopy;  Laterality: N/A;   FOOT SURGERY Bilateral    [place] spur, tendon" (1) -6'16, (1)-10'16   IR EPIDUROGRAPHY  07/21/2018   LUMBAR LAMINECTOMY/DECOMPRESSION MICRODISCECTOMY Bilateral 12/28/2015   Procedure: MICRO LUMBAR DECOMPRESSION L4 - L5 BILATERALLY;  Surgeon: JSusa Day MD;  Location: [facility];  Service: Orthopedics;  Laterality: Bilateral;   LUMBAR LAMINECTOMY/DECOMPRESSION MICRODISCECTOMY Bilateral 03/04/2018   Procedure: Revision of Microlumbar Decompression Bilateral Lumbar Four-Five;  Surgeon: BSusa Day MD;  [place] [facility]  Service: Orthopedics;  Laterality: Bilateral;  90 mins   SAVORY DILATION N/A 12/16/2020   Procedure: SAVORY DILATION;  Surgeon: SLadene Artist MD;  [facility];  Service: Endoscopy;  Laterality: N/A;   SPINAL CORD STIMULATOR INSERTION N/A 09/28/2019   Procedure: THORACIC SPINAL CORD STIMULATOR INSERTION;  Surgeon: BMelina Schools MD;  [facility];  Service: Orthopedics;  Laterality: N/A;  2.5 hrs   SPINAL CORD STIMULATOR REMOVAL N/A 05/27/2021   Procedure: LUMBAR SPINAL CORD STIMULATOR REMOVAL;  Surgeon: Deetta Perla, MD;  [facility];  Service: Neurosurgery;  Laterality: N/A;   TUBAL LIGATION     WISDOM TOOTH EXTRACTION     WOUND EXPLORATION N/A 03/04/2018   Procedure: EXPLORATION OF LUMBAR DECOMPRESSION WOUND;  Surgeon: [name], MD;  Location: [place];  Service: Orthopedics;  Laterality: N/A;    FAMHx:  Family History  Problem Relation Age of Onset   Heart attack Mother    Lung cancer Father    Cancer Father    Pancreatic cancer Sister    Breast cancer Sister 68   Throat cancer Brother    Multiple myeloma Sister    Breast cancer Sister        diagnosed in her 66's   Heart attack Sister    Stomach cancer Cousin    Colon cancer Neg Hx     SOCHx:   reports that she has never smoked. She has never used smokeless tobacco. She reports that she does not  drink alcohol and does not use drugs.  ALLERGIES:  Allergies  Allergen Reactions   Cephalosporins Anaphylaxis   Penicillins Anaphylaxis and Hives    Did it involve swelling of the face/tongue/throat, SOB, or low BP? Yes Did it involve sudden or severe rash/hives, skin peeling, or any reaction on the inside of your mouth or nose? Yes Did you need to seek medical attention at a hospital or doctor's office? Yes When did it last happen?      10+ years If all above answers are NO, may proceed with cephalosporin use.     Anesthetics, Amide Nausea And Vomiting    Does not know name of it. States they put it on record foot center.    Betadine [Povidone Iodine] Other (See Comments)    "Skin Burn" caused scar on Left buttock   Latex Hives, Itching and Rash   Peach [Prunus Persica] Hives   Claritin [Loratadine]     Rash, pruritis     ROS: Pertinent items noted in HPI and remainder of comprehensive ROS otherwise negative.  HOME MEDS: Current Outpatient Medications on File Prior to Visit  Medication Sig Dispense Refill   ALPRAZolam (XANAX) 0.5 MG tablet Take 1/2 to 1 tablet (0.25-0.5 mg total) by mouth at bedtime as needed for anxiety. 30 tablet 1   atorvastatin (LIPITOR) 20 MG tablet TAKE 1 TABLET BY MOUTH ONCE DAILY 90 tablet 2   baclofen (LIORESAL) 10 MG tablet TAKE 1 TABLET BY MOUTH THREE TIMES DAILY AS NEEDED 90 tablet 3   busPIRone (BUSPAR) 5 MG tablet Take 1 tablet by mouth 2  times daily. 180 tablet 1   carvedilol (COREG) 25 MG tablet Take 2 tablets (50 mg total) by mouth 2 (two) times daily with a meal. 360 tablet 3   cholecalciferol (VITAMIN D3) 25 MCG (1000 UNIT) tablet Take 1,000 Units by mouth daily.     diphenhydrAMINE (BENADRYL) 25 MG tablet Take 25 mg by mouth daily as needed for itching.     DULoxetine (CYMBALTA) 60 MG capsule Take 1 capsule (60 mg total) by mouth daily. 90 capsule 2   Estradiol Acetate (FEMRING) 0.05 MG/24HR RING USE ONE RING VAGINALLY EVERY 3 MONTHS AS  DIRECTED 1 each 2   gabapentin (NEURONTIN) 400 MG capsule Take 1 capsule by mouth three times daily. 90 capsule 3   meclizine (  ANTIVERT) 25 MG tablet TAKE 1 TABLET BY MOUTH 2 TIMES DAILY AS NEEDED FOR DIZZINESS 20 tablet 0   methocarbamol (ROBAXIN) 500 MG tablet TAKE 1 TABLET BY MOUTH AT BEDTIME AS NEEDED 90 tablet 0   senna-docusate (SENOKOT-S) 8.6-50 MG tablet Take 1 tablet by mouth 2 (two) times daily. 60 tablet 0   telmisartan (MICARDIS) 40 MG tablet Take 1 tablet (40 mg total) by mouth daily. 90 tablet 3   traMADol (ULTRAM) 50 MG tablet Take 50 mg by mouth every 6 (six) hours as needed.     oxyCODONE-acetaminophen (PERCOCET) 7.5-325 MG tablet Take 1 tablet by mouth every 6 (six) hours as needed for severe pain. (Patient not taking: Reported on 10/30/2021)     No current facility-administered medications on file prior to visit.    LABS/IMAGING: No results found for this or any previous visit (from the past 48 hour(s)). No results found.  LIPID PANEL:    Component Value Date/Time   CHOL 198 04/20/2020 0955   TRIG 62.0 04/20/2020 0955   HDL 61.70 04/20/2020 0955   CHOLHDL 3 04/20/2020 0955   VLDL 12.4 04/20/2020 0955   LDLCALC 123 (H) 04/20/2020 0955     WEIGHTS: Wt Readings from Last 3 Encounters:  10/30/21 168 lb (76.2 kg)  09/12/21 168 lb (76.2 kg)  07/31/21 168 lb (76.2 kg)    VITALS: BP 138/76    Pulse 84    Ht _0  (1.651 m)    Wt 168 lb (76.2 kg)    SpO2 99%    BMI 27.96 kg/m   EXAM: Deferred  EKG: Normal sinus rhythm at 84-personally reviewed  ASSESSMENT: Chest pain/dyspnea-minimally improved Echocardiographic features of hypertrophic cardiomyopathy, IVSD 19 mm (11/2020), however cardiac MRI did not show HOCM and basal septum measured 10 mm with an anomalous cord (07/2021) No evidence for LVOT obstruction Hypertension No coronary artery disease/coronary calcium by CT coronary angiography (03/2021) Family history of hypertrophic cardiomyopathy in her  brother  PLAN: 2.   [name] had a reassuring cardiac MRI and I do not feel at this point that hypertrophic cardiomyopathy is an issue for her.  Its not likely that this is contributing to her shortness of breath nor is it coronary disease as her CT showed no evidence of coronary disease.  I suspect that she has deconditioning due to her multiple orthopedic issues and would benefit from continued exercise programs.  Blood pressure is better controlled.  Follow-up with me annually or sooner as necessary.  Pixie Casino, MD, [facility], [place] Director of the Advanced Lipid Disorders &  Cardiovascular Risk Reduction Clinic Diplomate of the American Board of Clinical Lipidology Attending Cardiologist  Direct Dial: [phone]   Fax: [phone]  Website:  [email or web address] [name] 10/30/2021, 11:17 AM

## 2021-10-30 NOTE — Patient Instructions (Signed)
Medication Instructions:  ?Your physician recommends that you continue on your current medications as directed. Please refer to the Current Medication list given to you today. ? ?*If you need a refill on your cardiac medications before your next appointment, please call your pharmacy* ? ? ?Follow-Up: ?At [facility], you and your health needs are our priority.  As part of our continuing mission to provide you with exceptional heart care, we have created designated Provider Care Teams.  These Care Teams include your primary Cardiologist (physician) and Advanced Practice Providers (APPs -  Physician Assistants and Nurse Practitioners) who all work together to provide you with the care you need, when you need it. ? ?We recommend signing up for the patient portal called "MyChart".  Sign up information is provided on this After Visit Summary.  MyChart is used to connect with patients for Virtual Visits (Telemedicine).  Patients are able to view lab/test results, encounter notes, upcoming appointments, etc.  Non-urgent messages can be sent to your provider as well.   ?To learn more about what you can do with MyChart, go to [email or web address].   ? ?Your next appointment:   ?12 month(s) ? ?The format for your next appointment:   ?In Person ? ?Provider:   ?Kenneth C Hilty, MD { ? ? ? ?

## 2021-10-31 DIAGNOSIS — L668 Other cicatricial alopecia: Secondary | ICD-10-CM | POA: Diagnosis not present

## 2021-11-06 ENCOUNTER — Other Ambulatory Visit (HOSPITAL_COMMUNITY): Payer: Self-pay

## 2021-11-06 DIAGNOSIS — M542 Cervicalgia: Secondary | ICD-10-CM | POA: Diagnosis not present

## 2021-11-06 DIAGNOSIS — F419 Anxiety disorder, unspecified: Secondary | ICD-10-CM | POA: Diagnosis not present

## 2021-11-06 DIAGNOSIS — M25512 Pain in left shoulder: Secondary | ICD-10-CM | POA: Diagnosis not present

## 2021-11-06 MED ORDER — DIAZEPAM 10 MG PO TABS
ORAL_TABLET | ORAL | 0 refills | Status: DC
  Filled 2021-11-06: qty 2, 1d supply, fill #0

## 2021-11-07 ENCOUNTER — Other Ambulatory Visit (HOSPITAL_COMMUNITY): Payer: Self-pay

## 2021-11-11 ENCOUNTER — Ambulatory Visit: Payer: PPO | Attending: Family Medicine | Admitting: Physical Therapy

## 2021-11-11 ENCOUNTER — Other Ambulatory Visit: Payer: Self-pay

## 2021-11-11 DIAGNOSIS — R2689 Other abnormalities of gait and mobility: Secondary | ICD-10-CM | POA: Insufficient documentation

## 2021-11-11 DIAGNOSIS — R2681 Unsteadiness on feet: Secondary | ICD-10-CM | POA: Insufficient documentation

## 2021-11-11 DIAGNOSIS — M6281 Muscle weakness (generalized): Secondary | ICD-10-CM | POA: Diagnosis not present

## 2021-11-12 ENCOUNTER — Encounter: Payer: Self-pay | Admitting: Physical Therapy

## 2021-11-12 NOTE — Therapy (Signed)
[facility] [address] [place], [name], [address] Phone: [phone]   Fax:  [phone]  Physical Therapy Treatment  Patient Details  Name: [name] MRN: [id number] Date of Birth: 12/18/1960 Referring Provider (PT): Loleta Dicker, [place]   Encounter Date: 11/11/2021   PT End of Session - 11/12/21 1948     Visit Number 13    Number of Visits 15    Date for PT Re-Evaluation 11/29/21   treatment to resume 11-04-21 due to holiday schedule   Authorization Type [employer] medicare    Authorization Time Period 07-11-21 - 10-04-21; 10-14-21 - 11-29-21    Progress Note Due on Visit 20    PT Start Time 1545    PT Stop Time 1645    PT Time Calculation (min) 60 min    Equipment Utilized During Treatment Other (comment)   pool noodle (small) and bar bells   Activity Tolerance Patient tolerated treatment well   limited by increased headache with sit to Rt sidelying   Behavior During Therapy [facility] for tasks assessed/performed             Past Medical History:  Diagnosis Date   Anxiety    Aortic atherosclerosis ([place]) 04/16/2021   Atypical angina ([place])    Back pain    related to spinal stenosis and disc problem, radiates down left buttocks to leg., weakness occ.   Chest pain    a. 03/2015 Cath: nl cors; b. 03/2021 Cor CTA: Ca2+ = 0. Nl Cors.   Chronic pain syndrome    Dyspnea    GERD (gastroesophageal reflux disease)    Grade I diastolic dysfunction    Headache    Hyperlipidemia    Hypertension    Lumbar post-laminectomy syndrome    LVH (left ventricular hypertrophy) 12/15/2020   a. 11/2020 Echo: EF 65-70%, no rwma, sev asymm LVH with IVSd 1.9 cm. No LVOT obs @ rest. Gr1 DD. Triv MR.   PONV (postoperative nausea and vomiting)    Pulmonary nodules    a. 03/2021 CT Chest: 71mm pulm nodules in bilat lower lobes. F/u 1 yr.   Right foot drop    Syncope    a. 11/2020 Zio: No significant arrhythmias.   Vaginal foreign  object    "Uses Femring"    Past Surgical History:  Procedure Laterality Date   ABDOMINAL HYSTERECTOMY     BIOPSY  12/16/2020   Procedure: BIOPSY;  Surgeon: Ladene Artist, MD;  Location: [facility];  Service: Endoscopy;;   CARDIAC CATHETERIZATION N/A 04/18/2015   Procedure: Left Heart Cath and Coronary Angiography;  Surgeon: [name], MD;  Location: [facility];  Service: Cardiovascular;  Laterality: N/A;   COLONOSCOPY W/ BIOPSIES AND POLYPECTOMY     ESOPHAGOGASTRODUODENOSCOPY (EGD) WITH PROPOFOL N/A 12/16/2020   Procedure: ESOPHAGOGASTRODUODENOSCOPY (EGD) WITH PROPOFOL;  Surgeon: Ladene Artist, MD;  Location: [facility] ENDOSCOPY;  Service: Endoscopy;  Laterality: N/A;   FOOT SURGERY Bilateral    [place] "bunion,bone spur, tendon" (1) -6'16, (1)-10'16   IR EPIDUROGRAPHY  07/21/2018   LUMBAR LAMINECTOMY/DECOMPRESSION MICRODISCECTOMY Bilateral 12/28/2015   Procedure: MICRO LUMBAR DECOMPRESSION L4 - L5 BILATERALLY;  Surgeon: [name], MD;  Location: [facility];  Service: Orthopedics;  Laterality: Bilateral;   LUMBAR LAMINECTOMY/DECOMPRESSION MICRODISCECTOMY Bilateral 03/04/2018   Procedure: Revision of Microlumbar Decompression Bilateral Lumbar Four-Five;  Surgeon: [name], MD;  Location: [facility];  Service: Orthopedics;  Laterality: Bilateral;  90 mins   SAVORY DILATION  N/A 12/16/2020   Procedure: SAVORY DILATION;  Surgeon: Ladene Artist, MD;  Location: [facility] ENDOSCOPY;  Service: Endoscopy;  Laterality: N/A;   SPINAL CORD STIMULATOR INSERTION N/A 09/28/2019   Procedure: THORACIC SPINAL CORD STIMULATOR INSERTION;  Surgeon: Melina Schools, MD;  Location: [facility];  Service: Orthopedics;  Laterality: N/A;  2.5 hrs   SPINAL CORD STIMULATOR REMOVAL N/A 05/27/2021   Procedure: LUMBAR SPINAL CORD STIMULATOR REMOVAL;  Surgeon: Deetta Perla, MD;  Location: [facility];  Service: Neurosurgery;  Laterality: N/A;   TUBAL LIGATION     WISDOM TOOTH EXTRACTION     WOUND EXPLORATION N/A 03/04/2018    Procedure: EXPLORATION OF LUMBAR DECOMPRESSION WOUND;  Surgeon: [name], MD;  Location: [facility];  Service: Orthopedics;  Laterality: N/A;    There were no vitals filed for this visit.   Subjective Assessment - 11/12/21 1945     Subjective Pt reports she continues to have LUE pain & tingling; states she had injection done on 10-17-21 but states it did not help at all.  States she is scheduled to have MRI done next Monday, the 23rd - wants to know what is causing the pain    Pertinent History Spinal cord stimulator insertion on 09-28-19; stimulator removal on 05-27-21 with resultant LLE weakness;  decompression and laminectomy on 12/28/15 at L4-L5  with redo decompression surgery on May 9,2019, post-op paraparesis with surgery to remove small hematoma; 07/12/18 fall at work (involved rolling chair) with hospital admission, transfer to [facility] with d/c home 08/03/18; HTN, Bil foot surgery; left ventricular hypertrophy, chronic pain syndrome, dyspnea, hypertrophic cardiomyopathy    Patient Stated Goals "To be able to move left leg like I was able to before I had the surgery on 05-27-21"    Currently in Pain? Yes    Pain Score 7     Pain Location Arm    Pain Orientation Left    Pain Descriptors / Indicators Aching;Tingling    Pain Type Neuropathic pain    Pain Onset In the past 7 days    Pain Frequency Constant                   Aquatic therapy at [facility]  - pool temp 94 degrees   Patient seen for aquatic therapy today.  Treatment took place in water 3.5-4 feet deep depending upon activity.  Pt entered and  Exited the pool via step negotiation with use of bil. Hand rails with SBA  Pt performed bil. Hamstring and heel cord stretching 30 secs x 1 rep each leg (runner's stretch)  Pt performed forward amb. 18' x approx. 8 reps across pool with bil. UE support on pool noodle  Performed side stepping 18'  x 4 reps with bil. UE support on pool noodle with min assist for stabilization by  PT; backwards amb. 18' x 2 reps with CGA with UE support on noodle  Pt performed squats 10 reps bil. LE's with UE support on pool wall (small range)  Pt performed small range marching in place 10 reps each leg  Performed forward, back and side kicks 10 reps each leg with bil. UE support on pool edge  Pt performed LUE AROM and strengthening exercises - used small blue noodle - shoulder protraction/retraction 10 reps: shoulder flexion/extension with fingers adducted for increased resistance 10 reps:  shoulder abduction 10 reps with fingers adducted for increased resistance   Modified push ups 10 reps - pushing against pool wall for closed chain scapular musc. strengthening  Pt performed buoyancy assisted Lt shoulder ROM exercises - flexion, abduction and extension 10 reps each to assist with reducing Lt shoulder and arm pain   Pt requires aquatic therapy for the unweighting provided by the buoyancy of the water and needs the viscosity for strengthening LE's and trunk musc.; buoyancy of water needed for spinal decompression for increased tolerance for movement with less pain experienced than with land exercises                                 PT Short Term Goals - 11/12/21 1949       PT SHORT TERM GOAL #1   Title Pt will perform sit to stand transfers 5 reps with bil. UE support in </= 26 secs to demo improved LLE strength.    Baseline 3 reps - 32.72 secs with bil. UE support;   1st trial 1.00";  28.28 secs with bil. UE support on 2nd trial    Time 4    Period Weeks    Status On-going    Target Date 09/06/21      PT SHORT TERM GOAL #2   Title Pt will improve TUG score from 39.78 secs to </= 34 secs with rollator to demo improved functional mobility. UPGRADE:  score </= 24.5 secs with rollator    Baseline 39.78 secs with rollator;  28.12 secs with rollator    Time 4    Period Weeks    Status Revised    Target Date 09/06/21      PT SHORT TERM GOAL #3    Title Pt will amb. 4" nonstop with use of rollator with SBA for increased endurance and to demo improved LE strength.    Baseline pt amb. 3" 49 secs with rollator    Time 4    Period Weeks    Status On-going    Target Date 09/06/21      PT SHORT TERM GOAL #4   Title Assess step negotiation with use of handrails.    Time 4    Period Weeks    Status Achieved    Target Date 08/09/21      PT SHORT TERM GOAL #5   Title Pt will stand for 30 secs without UE support with SBA for increased independence with ADL's. UPGRADE - stand for 1.5" without UE support    Time 4    Period Weeks    Status Revised    Target Date 09/06/21      PT SHORT TERM GOAL #6   Title Independent in HEP for LLE strengthening and balance exercises.    Time 4    Period Weeks    Status Achieved    Target Date 08/09/21               PT Long Term Goals - 11/12/21 1949       PT LONG TERM GOAL #1   Title Pt will amb. short distances, i.e. 18' with use of SPC with SBA.    Baseline Pt continues to use rollator for assistance with ambulation to pool area due to distance of  200' from parking lot to pool area    Time 12    Period Weeks    Status Not Met      PT LONG TERM GOAL #2   Title Improve TUG score to </= 26 secs with use of rollator to demo improved functional mobility and reduced  fall risk.    Baseline 39.78 secs with rollator; goal deferred as pt requested to do aquatic therapy only    Time 12    Period Weeks    Status Deferred      PT LONG TERM GOAL #3   Title Pt will increase gait velocity from 1.02 ft/sec to >/= 1.8 ft/sec with rollator for increased gait efficiency.    Baseline 32.16 secs = 1.02 ft/sec with rollator on 07-11-21; unable to assess as pt is receiving aquatic therapy only - 10-14-21    Time 12    Period Weeks    Status Deferred      PT LONG TERM GOAL #4   Title Pt will stand for at least 10" with UE support prn for increased independence with ADL's.    Baseline met 10-14-21     Time 12    Period Weeks    Status Achieved      PT LONG TERM GOAL #5   Title Pt will be independent in HEP for strengthening & balance exercises.    Baseline met 10-14-21    Time 12    Period Weeks    Status Achieved                   Plan - 11/12/21 1949     Clinical Impression Statement Aquatic therapy session focused on bil. LE strengthening, LUE AROM & strengthening and gait and balance training.  Pt continues to need UE support with water walking for assist with balance.  Pt reported buoyancy assisted exercises for LUE felt good and seemed to reduce pain and discomfort in LUE.  Cont with POC.    Personal Factors and Comorbidities Past/Current Experience;Comorbidity 2;Time since onset of injury/illness/exacerbation;Sex    Comorbidities hypertrophic cardiomyopathy, left ventricular hypertrophy, post laminectomy syndrome, chronic pain syndrome, dyspnea, s/p spinal cord stimulator removal    Examination-Activity Limitations Stand;Locomotion Level;Bend;Squat;Stairs;Transfers;Carry;Reach Overhead    Examination-Participation Restrictions Cleaning;Community Activity;Meal Prep;Shop;Laundry;Church;Driving    Stability/Clinical Decision Making Evolving/Moderate complexity    Rehab Potential Good    PT Frequency 1x / week    PT Duration 4 weeks   4 additional aquatic therapy sessions requested   PT Treatment/Interventions ADLs/Self Care Home Management;Neuromuscular re-education;Balance training;Patient/family education;Aquatic Therapy;Scientist, product/process development;Therapeutic exercise;Stair training;Gait training;Orthotic Fit/Training    PT Next Visit Plan Cont with aquatic therapy for bil. LE strengthening    Consulted and Agree with Plan of Care Patient             Patient will benefit from skilled therapeutic intervention in order to improve the following deficits and impairments:  Pain, Difficulty walking, Decreased strength, Decreased activity tolerance, Abnormal  gait, Increased muscle spasms, Decreased endurance, Decreased balance, Decreased coordination, Decreased knowledge of use of DME, Decreased mobility, Decreased safety awareness, Impaired tone  Visit Diagnosis: Other abnormalities of gait and mobility  Muscle weakness (generalized)  Unsteadiness on feet     Problem List Patient Active Problem List   Diagnosis Date Noted   Uncontrolled pain 05/27/2021   Syncope 05/13/2021   Depression with anxiety 05/13/2021   Chronic diastolic CHF (congestive heart failure) ([place]) 05/13/2021   Chronic back pain 05/13/2021   Chest tightness 05/13/2021   Lower abdominal pain 05/13/2021   Syncope and collapse 01/01/2021   Abnormal echocardiogram 01/01/2021   BPPV (benign paroxysmal positional vertigo), left 12/26/2020   Odynophagia    Dysphagia 12/15/2020   Postural dizziness with presyncope 12/14/2020   Lumbar post-laminectomy syndrome    Chronic pain 09/28/2019  Lumbar spinal stenosis    Radicular pain    Gait disorder    Difficulty in urination 07/13/2018   Back pain 07/12/2018   Hyperlipidemia 04/16/2018   GAD (generalized anxiety disorder) 04/16/2018   AKI (acute kidney injury) ([place])    Benign essential HTN    Major depression, recurrent ([place])    Lumbar radiculopathy 03/09/2018   Paraparesis ([place]) 03/09/2018   Essential hypertension    Chronic nonintractable headache    Leukocytosis    Acute blood loss anemia    Postoperative pain    Generalized weakness    Spinal stenosis at L4-L5 level 03/04/2018   Spinal stenosis of lumbar region 12/28/2015   Chest pain 04/16/2015    Alda Lea, PT 11/12/2021, 7:56 PM  [facility] [address] [place], [name], [address] Phone: [phone]   Fax:  [phone]  Name: Beyza Bellino MRN: [id number] Date of Birth: 1961-10-01

## 2021-11-13 DIAGNOSIS — I700 Atherosclerosis of aorta: Secondary | ICD-10-CM | POA: Diagnosis not present

## 2021-11-13 DIAGNOSIS — I10 Essential (primary) hypertension: Secondary | ICD-10-CM | POA: Diagnosis not present

## 2021-11-16 ENCOUNTER — Other Ambulatory Visit (HOSPITAL_COMMUNITY): Payer: Self-pay

## 2021-11-18 ENCOUNTER — Ambulatory Visit: Payer: PPO | Admitting: Physical Therapy

## 2021-11-18 DIAGNOSIS — M5412 Radiculopathy, cervical region: Secondary | ICD-10-CM | POA: Diagnosis not present

## 2021-11-18 DIAGNOSIS — M25512 Pain in left shoulder: Secondary | ICD-10-CM | POA: Diagnosis not present

## 2021-11-21 ENCOUNTER — Telehealth: Payer: Self-pay

## 2021-11-21 DIAGNOSIS — M5412 Radiculopathy, cervical region: Secondary | ICD-10-CM | POA: Diagnosis not present

## 2021-11-21 NOTE — Telephone Encounter (Signed)
Patient states she would like a referral for another Orthopedic because she don't want to go to the one she has previously. Having more pain and MRI performed.

## 2021-11-22 NOTE — Telephone Encounter (Signed)
Patient has an appt on 11/27/2021 with Dr. Naaman Plummer. I informed her the referral will be addressed at that time.

## 2021-11-25 ENCOUNTER — Other Ambulatory Visit: Payer: Self-pay

## 2021-11-25 ENCOUNTER — Ambulatory Visit: Payer: PPO | Admitting: Physical Therapy

## 2021-11-25 DIAGNOSIS — R2689 Other abnormalities of gait and mobility: Secondary | ICD-10-CM | POA: Diagnosis not present

## 2021-11-25 DIAGNOSIS — M6281 Muscle weakness (generalized): Secondary | ICD-10-CM

## 2021-11-25 DIAGNOSIS — R2681 Unsteadiness on feet: Secondary | ICD-10-CM

## 2021-11-26 ENCOUNTER — Encounter: Payer: Self-pay | Admitting: Physical Therapy

## 2021-11-26 NOTE — Therapy (Signed)
[facility] [address] [place], [name], [address] Phone: [phone]   Fax:  [phone]  Physical Therapy Treatment  Patient Details  Name: [name] MRN: [id number] Date of Birth: [birth year] Referring Provider (PT): Loleta Dicker, [place]   Encounter Date: 11/25/2021   PT End of Session - 11/26/21 1651     Visit Number 14    Number of Visits 15    Date for PT Re-Evaluation 11/29/21   treatment to resume 11-04-21 due to holiday schedule   Authorization Type [employer] medicare    Authorization Time Period 07-11-21 - 10-04-21; 10-14-21 - 11-29-21    Progress Note Due on Visit 20    PT Start Time 1545    PT Stop Time 1620    PT Time Calculation (min) 35 min    Equipment Utilized During Treatment --   pool noodle (small) and bar bells   Activity Tolerance Patient tolerated treatment well   limited by increased headache with sit to Rt sidelying   Behavior During Therapy [facility] for tasks assessed/performed             Past Medical History:  Diagnosis Date   Anxiety    Aortic atherosclerosis ([place]) 04/16/2021   Atypical angina ([place])    Back pain    related to spinal stenosis and disc problem, radiates down left buttocks to leg., weakness occ.   Chest pain    a. 03/2015 Cath: nl cors; b. 03/2021 Cor CTA: Ca2+ = 0. Nl Cors.   Chronic pain syndrome    Dyspnea    GERD (gastroesophageal reflux disease)    Grade I diastolic dysfunction    Headache    Hyperlipidemia    Hypertension    Lumbar post-laminectomy syndrome    LVH (left ventricular hypertrophy) 12/15/2020   a. 11/2020 Echo: EF 65-70%, no rwma, sev asymm LVH with IVSd 1.9 cm. No LVOT obs @ rest. Gr1 DD. Triv MR.   PONV (postoperative nausea and vomiting)    Pulmonary nodules    a. 03/2021 CT Chest: 65m pulm nodules in bilat lower lobes. F/u 1 yr.   Right foot drop    Syncope    a. 11/2020 Zio: No significant arrhythmias.   Vaginal foreign object     "Uses Femring"    Past Surgical History:  Procedure Laterality Date   ABDOMINAL HYSTERECTOMY     BIOPSY  12/16/2020   Procedure: BIOPSY;  Surgeon: SLadene Artist MD;  [facility]  Service: Endoscopy;;   CARDIAC CATHETERIZATION N/A 04/18/2015   Procedure: Left Heart Cath and Coronary Angiography;  Surgeon: MCharolette Forward MD;  [facility];  Service: Cardiovascular;  Laterality: N/A;   COLONOSCOPY W/ BIOPSIES AND POLYPECTOMY     ESOPHAGOGASTRODUODENOSCOPY (EGD) WITH PROPOFOL N/A 12/16/2020   Procedure: ESOPHAGOGASTRODUODENOSCOPY (EGD) WITH PROPOFOL;  Surgeon: SLadene Artist MD;  [facility];  Service: Endoscopy;  Laterality: N/A;   FOOT SURGERY Bilateral    [place]"bunion,bone spur, tendon" (1) -6'16, (1)-10'16   IR EPIDUROGRAPHY  07/21/2018   LUMBAR LAMINECTOMY/DECOMPRESSION MICRODISCECTOMY Bilateral 12/28/2015   Procedure: MICRO LUMBAR DECOMPRESSION L4 - L5 BILATERALLY;  Surgeon: JSusa Day MD;  Location: [facility];  Service: Orthopedics;  Laterality: Bilateral;   LUMBAR LAMINECTOMY/DECOMPRESSION MICRODISCECTOMY Bilateral 03/04/2018   Procedure: Revision of Microlumbar Decompression Bilateral Lumbar Four-Five;  Surgeon: BSusa Day MD;  [facility]  Service: Orthopedics;  Laterality: Bilateral;  90 mins   SAVORY DILATION N/A  12/16/2020   Procedure: SAVORY DILATION;  Surgeon: Ladene Artist, MD;  Location: [facility];  Service: Endoscopy;  Laterality: N/A;   SPINAL CORD STIMULATOR INSERTION N/A 09/28/2019   Procedure: THORACIC SPINAL CORD STIMULATOR INSERTION;  Surgeon: Melina Schools, MD;  Location: [facility];  Service: Orthopedics;  Laterality: N/A;  2.5 hrs   SPINAL CORD STIMULATOR REMOVAL N/A 05/27/2021   Procedure: LUMBAR SPINAL CORD STIMULATOR REMOVAL;  Surgeon: Deetta Perla, MD;  Location: [facility];  Service: Neurosurgery;  Laterality: N/A;   TUBAL LIGATION     WISDOM TOOTH EXTRACTION     WOUND EXPLORATION N/A 03/04/2018    Procedure: EXPLORATION OF LUMBAR DECOMPRESSION WOUND;  Surgeon: [name], MD;  Location: [facility];  Service: Orthopedics;  Laterality: N/A;    There were no vitals filed for this visit.   Subjective Assessment - 11/26/21 1649     Subjective Pt reports she had MRI last week - has bulging disc from C3- C7; states Dr. Naaman Plummer is going to refer her to surgeon - states she doesn't want to see MD with [facility] - and states she doesn't want to have surgery on her neck    Pertinent History Spinal cord stimulator insertion on 09-28-19; stimulator removal on 05-27-21 with resultant LLE weakness;  decompression and laminectomy on 12/28/15 at L4-L5  with redo decompression surgery on May 9,2019, post-op paraparesis with surgery to remove small hematoma; 07/12/18 fall at work (involved rolling chair) with hospital admission, transfer to [place] with d/c home 08/03/18; HTN, Bil foot surgery; left ventricular hypertrophy, chronic pain syndrome, dyspnea, hypertrophic cardiomyopathy    Patient Stated Goals "To be able to move left leg like I was able to before I had the surgery on 05-27-21"    Currently in Pain? Yes    Pain Score 6     Pain Location Arm    Pain Orientation Left    Pain Descriptors / Indicators Aching;Tingling;Pins and needles    Pain Type Neuropathic pain    Pain Onset In the past 7 days    Pain Frequency Constant                       Aquatic therapy at [facility]  - pool temp 84 degrees   Patient seen for aquatic therapy today.  Treatment took place in water 3.5-4 feet deep depending upon activity.  Pt entered and  Exited the pool via step negotiation with use of bil. Hand rails with SBA  Pt performed bil. Hamstring and heel cord stretching 30 secs x 1 rep each leg (runner's stretch)  Pt performed forward amb. 18' x approx. 4 reps across pool with bil. UE support on pool noodle  Performed side stepping 18'  x 4 reps with bil. UE support on pool noodle with min assist for  stabilization by PT; backwards amb. 18' x 2 reps with CGA with UE support on noodle  Pt performed LUE AROM and strengthening exercises - used small blue noodle - shoulder protraction/retraction 10 reps: shoulder flexion/extension with fingers adducted for increased resistance 10 reps:  horizontal shoulder abduction/adduction 10 reps with small bar bells  Pt performed buoyancy assisted Lt shoulder ROM exercises - flexion, abduction and extension 10 reps each to assist with reducing Lt shoulder and arm pain   Pt requires aquatic therapy for the unweighting provided by the buoyancy of the water and needs the viscosity for strengthening LE's and trunk musc.; buoyancy of water needed for spinal decompression for increased  tolerance for movement with less pain experienced than with land exercises                        PT Short Term Goals - 11/26/21 1656       PT SHORT TERM GOAL #1   Title Pt will perform sit to stand transfers 5 reps with bil. UE support in </= 26 secs to demo improved LLE strength.    Baseline 3 reps - 32.72 secs with bil. UE support;   1st trial 1.00";  28.28 secs with bil. UE support on 2nd trial    Time 4    Period Weeks    Status On-going    Target Date 09/06/21      PT SHORT TERM GOAL #2   Title Pt will improve TUG score from 39.78 secs to </= 34 secs with rollator to demo improved functional mobility. UPGRADE:  score </= 24.5 secs with rollator    Baseline 39.78 secs with rollator;  28.12 secs with rollator    Time 4    Period Weeks    Status Revised    Target Date 09/06/21      PT SHORT TERM GOAL #3   Title Pt will amb. 4" nonstop with use of rollator with SBA for increased endurance and to demo improved LE strength.    Baseline pt amb. 3" 49 secs with rollator    Time 4    Period Weeks    Status On-going    Target Date 09/06/21      PT SHORT TERM GOAL #4   Title Assess step negotiation with use of handrails.    Time 4    Period Weeks     Status Achieved    Target Date 08/09/21      PT SHORT TERM GOAL #5   Title Pt will stand for 30 secs without UE support with SBA for increased independence with ADL's. UPGRADE - stand for 1.5" without UE support    Time 4    Period Weeks    Status Revised    Target Date 09/06/21      PT SHORT TERM GOAL #6   Title Independent in HEP for LLE strengthening and balance exercises.    Time 4    Period Weeks    Status Achieved    Target Date 08/09/21               PT Long Term Goals - 11/26/21 1656       PT LONG TERM GOAL #1   Title Pt will amb. short distances, i.e. 64' with use of SPC with SBA.    Baseline Pt continues to use rollator for assistance with ambulation to pool area due to distance of  200' from parking lot to pool area    Time 12    Period Weeks    Status Not Met    Target Date 11/29/21      PT LONG TERM GOAL #2   Title Improve TUG score to </= 26 secs with use of rollator to demo improved functional mobility and reduced fall risk.    Baseline 39.78 secs with rollator; goal deferred as pt requested to do aquatic therapy only    Time 12    Period Weeks    Status Deferred      PT LONG TERM GOAL #3   Title Pt will increase gait velocity from 1.02 ft/sec to >/= 1.8 ft/sec with rollator for  increased gait efficiency.    Baseline 32.16 secs = 1.02 ft/sec with rollator on 07-11-21; unable to assess as pt is receiving aquatic therapy only - 10-14-21    Time 12    Period Weeks    Status Deferred      PT LONG TERM GOAL #4   Title Pt will stand for at least 10" with UE support prn for increased independence with ADL's.    Baseline met 10-14-21    Time 12    Period Weeks    Status Achieved      PT LONG TERM GOAL #5   Title Pt will be independent in HEP for strengthening & balance exercises. Revised; Independent in updated HEP to include aquatic exercises.    Baseline met 10-14-21    Time 12    Period Weeks    Status On-going    Target Date 11/29/21                    Plan - 11/26/21 1652     Clinical Impression Statement Pt able to perform water walking with use of noodle for UE support for balance.  Pt able to perform buoyancy resisted exercises for Lt shoulder and upper trap strengthening without c/o pain, stating the exercises felt good to her neck.  Pt able to use small bar bells partially submerged for resistance for Lt shoulder AROM and strengthening.  Cont with POC.    Personal Factors and Comorbidities Past/Current Experience;Comorbidity 2;Time since onset of injury/illness/exacerbation;Sex    Comorbidities hypertrophic cardiomyopathy, left ventricular hypertrophy, post laminectomy syndrome, chronic pain syndrome, dyspnea, s/p spinal cord stimulator removal    Examination-Activity Limitations Stand;Locomotion Level;Bend;Squat;Stairs;Transfers;Carry;Reach Overhead    Examination-Participation Restrictions Cleaning;Community Activity;Meal Prep;Shop;Laundry;Church;Driving    Stability/Clinical Decision Making Evolving/Moderate complexity    Rehab Potential Good    PT Frequency 1x / week    PT Duration 4 weeks   4 additional aquatic therapy sessions requested   PT Treatment/Interventions ADLs/Self Care Home Management;Neuromuscular re-education;Balance training;Patient/family education;Aquatic Therapy;Scientist, product/process development;Therapeutic exercise;Stair training;Gait training;Orthotic Fit/Training    PT Next Visit Plan Cont with aquatic therapy for bil. LE strengthening    Consulted and Agree with Plan of Care Patient             Patient will benefit from skilled therapeutic intervention in order to improve the following deficits and impairments:  Pain, Difficulty walking, Decreased strength, Decreased activity tolerance, Abnormal gait, Increased muscle spasms, Decreased endurance, Decreased balance, Decreased coordination, Decreased knowledge of use of DME, Decreased mobility, Decreased safety awareness, Impaired  tone  Visit Diagnosis: Other abnormalities of gait and mobility  Muscle weakness (generalized)  Unsteadiness on feet     Problem List Patient Active Problem List   Diagnosis Date Noted   Uncontrolled pain 05/27/2021   Syncope 05/13/2021   Depression with anxiety 05/13/2021   Chronic diastolic CHF (congestive heart failure) ([place]) 05/13/2021   Chronic back pain 05/13/2021   Chest tightness 05/13/2021   Lower abdominal pain 05/13/2021   Syncope and collapse 01/01/2021   Abnormal echocardiogram 01/01/2021   BPPV (benign paroxysmal positional vertigo), left 12/26/2020   Odynophagia    Dysphagia 12/15/2020   Postural dizziness with presyncope 12/14/2020   Lumbar post-laminectomy syndrome    Chronic pain 09/28/2019   Lumbar spinal stenosis    Radicular pain    Gait disorder    Difficulty in urination 07/13/2018   Back pain 07/12/2018   Hyperlipidemia 04/16/2018   GAD (generalized anxiety disorder) 04/16/2018  AKI (acute kidney injury) ([place])    Benign essential HTN    Major depression, recurrent ([place])    Lumbar radiculopathy 03/09/2018   Paraparesis ([place]) 03/09/2018   Essential hypertension    Chronic nonintractable headache    Leukocytosis    Acute blood loss anemia    Postoperative pain    Generalized weakness    Spinal stenosis at L4-L5 level 03/04/2018   Spinal stenosis of lumbar region 12/28/2015   Chest pain 04/16/2015    Cedar Ditullio, [place], PT 11/26/2021, 5:03 PM  [facility] [address] [place], [name], [address] Phone: [phone]   Fax:  [phone]  Name: [name] MRN: [id number] Date of Birth: 10-May-1961

## 2021-11-27 ENCOUNTER — Other Ambulatory Visit: Payer: Self-pay

## 2021-11-27 ENCOUNTER — Encounter: Payer: Self-pay | Admitting: Physical Medicine & Rehabilitation

## 2021-11-27 ENCOUNTER — Other Ambulatory Visit (HOSPITAL_COMMUNITY): Payer: Self-pay

## 2021-11-27 ENCOUNTER — Encounter: Payer: PPO | Attending: Physical Medicine & Rehabilitation | Admitting: Physical Medicine & Rehabilitation

## 2021-11-27 VITALS — BP 165/91 | HR 77 | Temp 98.5°F | Ht 65.0 in

## 2021-11-27 DIAGNOSIS — M5010 Cervical disc disorder with radiculopathy, unspecified cervical region: Secondary | ICD-10-CM | POA: Insufficient documentation

## 2021-11-27 DIAGNOSIS — G894 Chronic pain syndrome: Secondary | ICD-10-CM | POA: Diagnosis not present

## 2021-11-27 DIAGNOSIS — M961 Postlaminectomy syndrome, not elsewhere classified: Secondary | ICD-10-CM | POA: Insufficient documentation

## 2021-11-27 MED ORDER — PREDNISONE 20 MG PO TABS
20.0000 mg | ORAL_TABLET | ORAL | 0 refills | Status: DC
  Filled 2021-11-27: qty 26, 16d supply, fill #0

## 2021-11-27 MED ORDER — TRAMADOL HCL 50 MG PO TABS
50.0000 mg | ORAL_TABLET | Freq: Four times a day (QID) | ORAL | 2 refills | Status: DC | PRN
  Filled 2021-11-27: qty 30, 7d supply, fill #0

## 2021-11-27 NOTE — Patient Instructions (Signed)
PLEASE FEEL FREE TO CALL OUR OFFICE WITH ANY PROBLEMS OR QUESTIONS ([phone])      

## 2021-11-27 NOTE — Progress Notes (Signed)
Subjective:    Patient ID: [name], female    DOB: 05/01/1961, 61 y.o.   MRN: [id number]  HPI  [name] is here in follow up of her chronic pain and gait disorder. Just after thanksgiving she developed acute neck and left arm pain with associated numbness and weakness.  Her numbness extends all the way down to her hand.  She feels that her thumb index and middle finger have the most sensory loss.  She received some oral steroids which did not make a big difference.  She remains on gabapentin 300 mg 3 times daily per baseline.  She had cervical nerve block of some sort before christmas which provided no relief. She ultimately had a cervical MRI by [facility] on 11/18/21.   She brought a copy of her cervical MRI report.  Most significantly she has degenerative disc disease at C5-C6 with a focal disc protrusion to the left as well as significant osteophytes at this level which are contributing to foraminal stenosis and impingement upon the C6 nerve root.  Pain Inventory Average Pain 10 Pain Right Now 8 My pain is constant, burning, tingling, and aching  In the last 24 hours, has pain interfered with the following? General activity 7 Relation with others 6 Enjoyment of life 7 What TIME of day is your pain at its worst? morning , daytime, and night Sleep (in general) Fair  Pain is worse with: bending, sitting, standing, and some activites Pain improves with: medication and heat, therapy Relief from Meds: 8  Family History  Problem Relation Age of Onset   Heart attack Mother    Lung cancer Father    Cancer Father    Pancreatic cancer Sister    Breast cancer Sister 1   Throat cancer Brother    Multiple myeloma Sister    Breast cancer Sister        diagnosed in her 9's   Heart attack Sister    Stomach cancer Cousin    Colon cancer Neg Hx    Social History   Socioeconomic History   Marital status: Married    Spouse name: Not on file   Number of children: Not  on file   Years of education: Not on file   Highest education level: Not on file  Occupational History   Not on file  Tobacco Use   Smoking status: Never   Smokeless tobacco: Never  Vaping Use   Vaping Use: Never used  Substance and Sexual Activity   Alcohol use: No   Drug use: No   Sexual activity: Yes    Partners: Male    Birth control/protection: Post-menopausal  Other Topics Concern   Not on file  Social History Narrative   Not on file   Social Determinants of Health   Financial Resource Strain: Low Risk    Difficulty of Paying Living Expenses: Not hard at all  Food Insecurity: No Food Insecurity   Worried About Programme researcher, broadcasting/film/video in the Last Year: Never true   Ran Out of Food in the Last Year: Never true  Transportation Needs: No Transportation Needs   Lack of Transportation (Medical): No   Lack of Transportation (Non-Medical): No  Physical Activity: Insufficiently Active   Days of Exercise per Week: 2 days   Minutes of Exercise per Session: 40 min  Stress: No Stress Concern Present   Feeling of Stress : Not at all  Social Connections: Socially Integrated   Frequency of Communication with Friends  and Family: More than three times a week   Frequency of Social Gatherings with Friends and Family: Twice a week   Attends Religious Services: 1 to 4 times per year   Active Member of Clubs or Organizations: Yes   Attends Banker Meetings: 1 to 4 times per year   Marital Status: Married   Past Surgical History:  Procedure Laterality Date   ABDOMINAL HYSTERECTOMY     BIOPSY  12/16/2020   Procedure: BIOPSY;  Surgeon: Meryl Dare, MD;  Location: [facility] ENDOSCOPY;  Service: Endoscopy;;   CARDIAC CATHETERIZATION N/A 04/18/2015   Procedure: Left Heart Cath and Coronary Angiography;  Surgeon: Rinaldo Cloud, MD;  Location: [facility] INVASIVE CV LAB;  Service: Cardiovascular;  Laterality: N/A;   COLONOSCOPY W/ BIOPSIES AND POLYPECTOMY     ESOPHAGOGASTRODUODENOSCOPY (EGD)  WITH PROPOFOL N/A 12/16/2020   Procedure: ESOPHAGOGASTRODUODENOSCOPY (EGD) WITH PROPOFOL;  Surgeon: Meryl Dare, MD;  Location: [facility] ENDOSCOPY;  Service: Endoscopy;  Laterality: N/A;   FOOT SURGERY Bilateral    [facility] "bunion,bone spur, tendon" (1) -6'16, (1)-10'16   IR EPIDUROGRAPHY  07/21/2018   LUMBAR LAMINECTOMY/DECOMPRESSION MICRODISCECTOMY Bilateral 12/28/2015   Procedure: MICRO LUMBAR DECOMPRESSION L4 - L5 BILATERALLY;  Surgeon: Jene Every, MD;  Location: [facility] ORS;  Service: Orthopedics;  Laterality: Bilateral;   LUMBAR LAMINECTOMY/DECOMPRESSION MICRODISCECTOMY Bilateral 03/04/2018   Procedure: Revision of Microlumbar Decompression Bilateral Lumbar Four-Five;  Surgeon: Jene Every, MD;  Location: [facility] OR;  Service: Orthopedics;  Laterality: Bilateral;  90 mins   SAVORY DILATION N/A 12/16/2020   Procedure: SAVORY DILATION;  Surgeon: Meryl Dare, MD;  Location: [facility] ENDOSCOPY;  Service: Endoscopy;  Laterality: N/A;   SPINAL CORD STIMULATOR INSERTION N/A 09/28/2019   Procedure: THORACIC SPINAL CORD STIMULATOR INSERTION;  Surgeon: Venita Lick, MD;  Location: [facility] OR;  Service: Orthopedics;  Laterality: N/A;  2.5 hrs   SPINAL CORD STIMULATOR REMOVAL N/A 05/27/2021   Procedure: LUMBAR SPINAL CORD STIMULATOR REMOVAL;  Surgeon: Lucy Chris, MD;  Location: [facility] ORS;  Service: Neurosurgery;  Laterality: N/A;   TUBAL LIGATION     WISDOM TOOTH EXTRACTION     WOUND EXPLORATION N/A 03/04/2018   Procedure: EXPLORATION OF LUMBAR DECOMPRESSION WOUND;  Surgeon: Jene Every, MD;  Location: [facility] OR;  Service: Orthopedics;  Laterality: N/A;   Past Surgical History:  Procedure Laterality Date   ABDOMINAL HYSTERECTOMY     BIOPSY  12/16/2020   Procedure: BIOPSY;  Surgeon: Meryl Dare, MD;  Location: [facility] ENDOSCOPY;  Service: Endoscopy;;   CARDIAC CATHETERIZATION N/A 04/18/2015   Procedure: Left Heart Cath and Coronary Angiography;  Surgeon: Rinaldo Cloud, MD;  Location: [facility] INVASIVE CV LAB;   Service: Cardiovascular;  Laterality: N/A;   COLONOSCOPY W/ BIOPSIES AND POLYPECTOMY     ESOPHAGOGASTRODUODENOSCOPY (EGD) WITH PROPOFOL N/A 12/16/2020   Procedure: ESOPHAGOGASTRODUODENOSCOPY (EGD) WITH PROPOFOL;  Surgeon: Meryl Dare, MD;  Location: [facility] ENDOSCOPY;  Service: Endoscopy;  Laterality: N/A;   FOOT SURGERY Bilateral    [facility] "bunion,bone spur, tendon" (1) -6'16, (1)-10'16   IR EPIDUROGRAPHY  07/21/2018   LUMBAR LAMINECTOMY/DECOMPRESSION MICRODISCECTOMY Bilateral 12/28/2015   Procedure: MICRO LUMBAR DECOMPRESSION L4 - L5 BILATERALLY;  Surgeon: Jene Every, MD;  Location: [facility] ORS;  Service: Orthopedics;  Laterality: Bilateral;   LUMBAR LAMINECTOMY/DECOMPRESSION MICRODISCECTOMY Bilateral 03/04/2018   Procedure: Revision of Microlumbar Decompression Bilateral Lumbar Four-Five;  Surgeon: Jene Every, MD;  Location: [facility] OR;  Service: Orthopedics;  Laterality: Bilateral;  90 mins   SAVORY DILATION N/A 12/16/2020  Procedure: SAVORY DILATION;  Surgeon: Ladene Artist, MD;  Location: [facility] ENDOSCOPY;  Service: Endoscopy;  Laterality: N/A;   SPINAL CORD STIMULATOR INSERTION N/A 09/28/2019   Procedure: THORACIC SPINAL CORD STIMULATOR INSERTION;  Surgeon: Melina Schools, MD;  Location: [facility];  Service: Orthopedics;  Laterality: N/A;  2.5 hrs   SPINAL CORD STIMULATOR REMOVAL N/A 05/27/2021   Procedure: LUMBAR SPINAL CORD STIMULATOR REMOVAL;  Surgeon: Deetta Perla, MD;  Location: [facility] ORS;  Service: Neurosurgery;  Laterality: N/A;   TUBAL LIGATION     WISDOM TOOTH EXTRACTION     WOUND EXPLORATION N/A 03/04/2018   Procedure: EXPLORATION OF LUMBAR DECOMPRESSION WOUND;  Surgeon: [name], MD;  Location: [facility];  Service: Orthopedics;  Laterality: N/A;   Past Medical History:  Diagnosis Date   Anxiety    Aortic atherosclerosis ([place]) 04/16/2021   Atypical angina (H[facility])    Back pain    related to spinal stenosis and disc problem, radiates down left buttocks to leg., weakness occ.    Chest pain    a. 03/2015 Cath: nl cors; b. 03/2021 Cor CTA: Ca2+ = 0. Nl Cors.   Chronic pain syndrome    Dyspnea    GERD (gastroesophageal reflux disease)    Grade I diastolic dysfunction    Headache    Hyperlipidemia    Hypertension    Lumbar post-laminectomy syndrome    LVH (left ventricular hypertrophy) 12/15/2020   a. 11/2020 Echo: EF 65-70%, no rwma, sev asymm LVH with IVSd 1.9 cm. No LVOT obs @ rest. Gr1 DD. Triv MR.   PONV (postoperative nausea and vomiting)    Pulmonary nodules    a. 03/2021 CT Chest: 69mm pulm nodules in bilat lower lobes. F/u 1 yr.   Right foot drop    Syncope    a. 11/2020 Zio: No significant arrhythmias.   Vaginal foreign object    "Uses Femring"   Ht $R'5\' 5"'OC$  (1.651 m)    BMI 27.96 kg/m   Opioid Risk Score:   Fall Risk Score:  `1  Depression screen PHQ 2/9  Depression screen [facility] 2/9 10/04/2021 09/12/2021 07/31/2021 04/22/2020  Decreased Interest 0 0 0 2  Down, Depressed, Hopeless 0 0 0 0  PHQ - 2 Score 0 0 0 2  Altered sleeping 0 - - 2  Tired, decreased energy 0 - - 2  Change in appetite 0 - - 1  Feeling bad or failure about yourself  0 - - 0  Trouble concentrating 0 - - 2  Moving slowly or fidgety/restless 0 - - 1  Suicidal thoughts 0 - - 0  PHQ-9 Score 0 - - 10  Difficult doing work/chores - - - Very difficult  Some recent data might be hidden    Review of Systems  Musculoskeletal:  Positive for gait problem.       Left shoulder pain, left arm pain  Neurological:        Left finger tingles  All other systems reviewed and are negative.     Objective:   Physical Exam  General: No acute distress HEENT: EOMI, oral membranes moist Cards: reg rate  Chest: normal effort Abdomen: Soft, NT, ND Skin: dry, intact Extremities: no edema Psych: pleasant and appropriate Skin: intact.  Surgical sites are well-healed Neuro: RLE 2/5 to 3/5... LLE 4/5. Sensory loss distally bilaterally.  Upper extremities are 4+ to 5 out of 5 on the right and 3+ to 4  out of 5 on the left with some pain inhibition.  It is hard to localize focal muscle weakness as she was having pain with most resisted movements.  She did have subjectively decreased sensation over the median half of the left hand as well as the forearm more so than the ulnar side.  Reflexes are all 2+ on the left. Musculoskeletal: Spurling's test provoked symptoms in her left shoulder and arm.  She had pain with palpation over the left cervical paraspinals and left trap etc.  There is seem to be also pain with passive and active shoulder range of motion. Assessment & Plan:  1. Post Lumbar Laminectomy / Decompression: Lumbar Radiculopathy:             -gabapentin continue at $RemoveBef'300mg'XJXkmqaFzc$  tid             -tramadol  for breakthrough pain.  I refilled this today.            -cymbalta $RemoveBe'30mg'KlVnYEoQf$  daily             -Home exercise program, complete therapies as an outpatient  2. New onset left BPPV-continue outpt therapies 3. Anxiety. PCP Following and prescribing Alprazolam. Education Provided. Continue to Monitor.              -resume cymbalta $RemoveBeforeDE'30mg'MwlvFTemcYxtjRM$  daily 4. Spasms: may continue robaxin 5. Orthotics:             -advised pt to call [facility] about application of T-strap 6.  Cervical spondylosis with radiculopathy.  She has significant disease at the C5-C6 level with severe left foraminal stenosis and C6 nerve root impingement.  Her exam findings are generally consistent with the MRI.  -Made referral to neurosurgery for assessment.  She may be appropriate for a transforaminal block at C5-C6 on the left.  Findings are significant enough that I feel she should have a surgical opinion, however.  -Gave the patient a handwritten prednisone taper over 16 days time  -Cannot titrate gabapentin any further given her history and current GFR  -Tramadol for breakthrough pain  -Encouraged good posture, range of motion exercises as tolerated.  Could be a candidate for traction.   30 minutes of face to face patient care time were  spent during this visit. All questions were encouraged and answered.  Follow up with me in 4 mos .

## 2021-12-05 ENCOUNTER — Other Ambulatory Visit (HOSPITAL_COMMUNITY): Payer: Self-pay

## 2021-12-09 ENCOUNTER — Ambulatory Visit: Payer: PPO | Attending: Family Medicine | Admitting: Physical Therapy

## 2021-12-09 ENCOUNTER — Other Ambulatory Visit: Payer: Self-pay

## 2021-12-09 DIAGNOSIS — M79602 Pain in left arm: Secondary | ICD-10-CM | POA: Insufficient documentation

## 2021-12-09 DIAGNOSIS — R2681 Unsteadiness on feet: Secondary | ICD-10-CM | POA: Insufficient documentation

## 2021-12-09 DIAGNOSIS — R2689 Other abnormalities of gait and mobility: Secondary | ICD-10-CM | POA: Insufficient documentation

## 2021-12-09 DIAGNOSIS — M6281 Muscle weakness (generalized): Secondary | ICD-10-CM | POA: Insufficient documentation

## 2021-12-09 NOTE — Therapy (Signed)
[facility] [address] [place], [name], [address] Phone: [phone]   Fax:  [phone]  Patient Details  Name: [name] MRN: [id number] Date of Birth: 11-May-1961 Referring Provider:  [name], [name], MD  Encounter Date: 12/09/2021   Pt arrived to today's aquatic therapy session at [facility] - stated she realized when she got out of her car to come inside that she had forgotten her bag with her swimsuit in it.  Pt stated she lived too far away to go back home and get it.  Today's session cancelled;  arrived - no charge.    Alda Lea, PT 12/09/2021, 7:39 PM  [facility] [address] [place], [name], [address] Phone: [phone]   Fax:  [phone]

## 2021-12-10 ENCOUNTER — Other Ambulatory Visit (HOSPITAL_COMMUNITY): Payer: Self-pay

## 2021-12-10 MED ORDER — FEMRING 0.05 MG/24HR VA RING
VAGINAL_RING | VAGINAL | 0 refills | Status: DC
  Filled 2021-12-10: qty 1, 90d supply, fill #0

## 2021-12-11 ENCOUNTER — Other Ambulatory Visit (HOSPITAL_COMMUNITY): Payer: Self-pay

## 2021-12-12 DIAGNOSIS — M5412 Radiculopathy, cervical region: Secondary | ICD-10-CM | POA: Diagnosis not present

## 2021-12-13 ENCOUNTER — Other Ambulatory Visit (HOSPITAL_COMMUNITY): Payer: Self-pay

## 2021-12-13 ENCOUNTER — Telehealth: Payer: Self-pay | Admitting: Pharmacist

## 2021-12-13 ENCOUNTER — Telehealth: Payer: Self-pay

## 2021-12-13 NOTE — Telephone Encounter (Addendum)
° °  Pre-operative Risk Assessment    Patient Name: [name]  DOB: [birth year] MRN: [id number]      Request for Surgical Clearance    Procedure:   C4-5, C5-6 Anterior Cervical Fusion  Date of Surgery:  Clearance TBD                                 Surgeon:  Eustace Moore, MD Surgeon's Group or Practice Name:  [facility]  Phone number:  [phone] Fax number:  [phone]   Type of Clearance Requested:   - Medical    Type of Anesthesia:  General    Additional requests/questions:    SignedJacqulynn Cadet   12/13/2021, 3:59 PM

## 2021-12-13 NOTE — Chronic Care Management (AMB) (Signed)
° ° °  Chronic Care Management Pharmacy Assistant   Name: [name]  MRN: [id number] DOB: [birth year] 12/13/21 APPOINTMENT REMINDER   Patient was reminded to have all medications, supplements and any blood glucose and blood pressure readings available for review with Jeni Salles, Pharm. D, for telephone visit on 12/17/21 at 10:30.   Care Gaps: BP- 165/91 (11/27/21) Zoster Vaccine - Overdue COVID Booster - Overdue AWV- 11/22  Star Rating Drug: Telmisartan - HCTZ 40-12.5 mg - Last filled 07/10/21 90 DS at [facility] (Verified) Telmisartan 40 mg - Last filled 07/02/21 90 DS at [facility] (Verified) Atorvastatin (Lipitor) 20 mg - Last filled 11/16/21 90 DS at [facility]    Any gaps in medications fill history? See  above    Medications: Outpatient Encounter Medications as of 12/13/2021  Medication Sig Note   ALPRAZolam (XANAX) 0.5 MG tablet Take 1/2 to 1 tablet (0.25-0.5 mg total) by mouth at bedtime as needed for anxiety.    atorvastatin (LIPITOR) 20 MG tablet TAKE 1 TABLET BY MOUTH ONCE DAILY    baclofen (LIORESAL) 10 MG tablet TAKE 1 TABLET BY MOUTH THREE TIMES DAILY AS NEEDED    busPIRone (BUSPAR) 5 MG tablet Take 1 tablet by mouth 2  times daily.    carvedilol (COREG) 25 MG tablet Take 2 tablets (50 mg total) by mouth 2 (two) times daily with a meal.    cholecalciferol (VITAMIN D3) 25 MCG (1000 UNIT) tablet Take 1,000 Units by mouth daily.    diazepam (VALIUM) 10 MG tablet Take one tablet by mouth prior to MRI/procedure.  May repeat one time.    diphenhydrAMINE (BENADRYL) 25 MG tablet Take 25 mg by mouth daily as needed for itching.    DULoxetine (CYMBALTA) 60 MG capsule Take 1 capsule (60 mg total) by mouth daily.    Estradiol Acetate (FEMRING) 0.05 MG/24HR RING USE ONE RING VAGINALLY EVERY 3 MONTHS AS DIRECTED    gabapentin (NEURONTIN) 400 MG capsule Take 1 capsule by mouth three times daily.    LORazepam (ATIVAN) 1 MG tablet lorazepam 1 mg tablet    meclizine  (ANTIVERT) 25 MG tablet TAKE 1 TABLET BY MOUTH 2 TIMES DAILY AS NEEDED FOR DIZZINESS    methocarbamol (ROBAXIN) 500 MG tablet TAKE 1 TABLET BY MOUTH AT BEDTIME AS NEEDED    oxyCODONE-acetaminophen (PERCOCET) 7.5-325 MG tablet Take 1 tablet by mouth every 6 (six) hours as needed for severe pain. 11/27/2021: NOT HERE FOR COUNT. LAST TAKEN MAYBE 3 DAYS AGO.   predniSONE (DELTASONE) 20 MG tablet Take 1 tab 3 x daily for 4 days then 1 tab 2x daily for 4 days then 1 tab once daily for 4 days, then 1/2 tab daily for 4 days and off.    senna-docusate (SENOKOT-S) 8.6-50 MG tablet Take 1 tablet by mouth 2 (two) times daily.    telmisartan (MICARDIS) 40 MG tablet Take 1 tablet (40 mg total) by mouth daily.    telmisartan-hydrochlorothiazide (MICARDIS HCT) 40-12.5 MG tablet telmisartan 40 mg-hydrochlorothiazide 12.5 mg tablet    traMADol (ULTRAM) 50 MG tablet Take 1 tablet (50 mg total) by mouth every 6 (six) hours as needed.    No facility-administered encounter medications on file as of 12/13/2021.     [facility] Clinical Pharmacist Assistant [phone]

## 2021-12-16 ENCOUNTER — Other Ambulatory Visit (HOSPITAL_COMMUNITY): Payer: Self-pay

## 2021-12-16 ENCOUNTER — Ambulatory Visit: Payer: PPO | Admitting: Physical Therapy

## 2021-12-16 ENCOUNTER — Other Ambulatory Visit: Payer: Self-pay | Admitting: Neurological Surgery

## 2021-12-16 ENCOUNTER — Other Ambulatory Visit: Payer: Self-pay

## 2021-12-16 DIAGNOSIS — L668 Other cicatricial alopecia: Secondary | ICD-10-CM | POA: Diagnosis not present

## 2021-12-16 DIAGNOSIS — R2689 Other abnormalities of gait and mobility: Secondary | ICD-10-CM

## 2021-12-16 DIAGNOSIS — M79602 Pain in left arm: Secondary | ICD-10-CM

## 2021-12-16 DIAGNOSIS — R2681 Unsteadiness on feet: Secondary | ICD-10-CM | POA: Diagnosis not present

## 2021-12-16 DIAGNOSIS — M6281 Muscle weakness (generalized): Secondary | ICD-10-CM

## 2021-12-16 MED ORDER — BETAMETHASONE DIPROPIONATE 0.05 % EX LOTN
TOPICAL_LOTION | CUTANEOUS | 11 refills | Status: DC
  Filled 2021-12-16: qty 60, 30d supply, fill #0
  Filled 2022-04-15: qty 60, 30d supply, fill #1

## 2021-12-16 NOTE — Telephone Encounter (Signed)
° °  Name: [name]  DOB: Apr 14, 1961  MRN: [id number]   Primary Cardiologist: Pixie Casino, MD  Chart reviewed as part of pre-operative protocol coverage. Patient was contacted 12/16/2021 in reference to pre-operative risk assessment for pending surgery as outlined below.  Adela Lank Reyonna Haack was last seen on 14/23 by Dr. Debara Pickett.  Since that day, Sarye Kath has done well. CT coronary 07/2021 showed no coronary artery disease. She struggles with neck and arm pain. She can complete 4.0 METS without angina (water aerobics for 45 min).   Therefore, based on ACC/AHA guidelines, the patient would be at acceptable risk for the planned procedure without further cardiovascular testing.   The patient was advised that if she develops new symptoms prior to surgery to contact our office to arrange for a follow-up visit, and she verbalized understanding.  I will route this recommendation to the requesting party via Epic fax function and remove from pre-op pool. Please call with questions.  Tami Lin Tadeusz Stahl, PA 12/16/2021, 12:21 PM

## 2021-12-17 ENCOUNTER — Other Ambulatory Visit (HOSPITAL_COMMUNITY): Payer: Self-pay

## 2021-12-17 ENCOUNTER — Ambulatory Visit (INDEPENDENT_AMBULATORY_CARE_PROVIDER_SITE_OTHER): Payer: PPO | Admitting: Pharmacist

## 2021-12-17 ENCOUNTER — Encounter: Payer: Self-pay | Admitting: Physical Therapy

## 2021-12-17 DIAGNOSIS — I10 Essential (primary) hypertension: Secondary | ICD-10-CM

## 2021-12-17 DIAGNOSIS — F411 Generalized anxiety disorder: Secondary | ICD-10-CM

## 2021-12-17 NOTE — Progress Notes (Signed)
Chronic Care Management Pharmacy Note  12/24/2021 Name:  [name] MRN:  [id number] DOB:  10/20/1961  Summary: LDL not at goal < 100 Anxiety is not ideally controlled Carvedilol is above recommended dose  Recommendations/Changes made from today's visit: -Recommended for patient to restart taking buspirone daily as prescribed as she thought it was PRN -Recommend repeat lipid panel and consider increasing statin intensity if LDL is not < 100 -Recommended routine BP monitoring at home  Plan: Follow up BP and anxiety assessment in 1 month   Subjective: [name] is an 61 y.o. year old female who is a primary patient of [place], Timoteo Expose, MD.  The CCM team was consulted for assistance with disease management and care coordination needs.    Engaged with patient by telephone for follow up visit in response to provider referral for pharmacy case management and/or care coordination services.   Consent to Services:  The patient was given information about Chronic Care Management services, agreed to services, and gave verbal consent prior to initiation of services.  Please see initial visit note for detailed documentation.   Patient Care Team: [place], [name], MD as PCP - General (Family Medicine) Rennis Golden Lisette Abu, MD as PCP - General (Cardiology) Rennis Golden Lisette Abu, MD as PCP - Cardiology (Cardiology) Jene Every, MD as Consulting Physician (Orthopedic Surgery) Verner Chol, [facility] as Pharmacist (Pharmacist)  Recent office visits: 10/04/21 [place], [name], MD- Patient presented for acute shoulder pain. Prescribed celebrex twice daily x 7 days and prednisone taper.  09/12/21 Theresa Mulligan, LPN: Patient presented for AWV.  08/21/21 [place], [name], MD- Patient presented for HTN and anxiety follow up. Prescribed buspirone 5 mg BID.  Recent consult visits: 12/09/20 Kerry Fort, PT (outpatient rehab): Patient presented for PT.  11/27/21 Ranelle Oyster, MD (Physical Med) - Patient presented for Postoperative pain and other concerns. Prescribed prednisone taper. Referred to neurosurgery.   10/30/21 Hilty, Lisette Abu, MD (Cardiology) - Patient presented for chest pain and dyspnea. No medication changes.  10/17/21 Sheran Luz (PT) - Patient presented for opioid screening and other concerns. No medication changes. 08-01-2021 Kerry Fort, PT (Rehab) - Patient presented for Other abnormalities of gait and mobility and other concerns. No medication changes.   07-31-2021 Ranelle Oyster, MD (Phys Med) - Patient presented for Lumbar radiculopathy and other concerns. No medication changes.   07-16-2021 Nathaniel Man (Neurosurg) - Patient presented for follow up from surgery and other concerns. No medication changes.   07-15-2021 Kerry Fort, PT (Rehab) - Patient presented for Other abnormalities of gait and mobility and other concerns. No medication changes.  Hospital visits:   Objective:  Lab Results  Component Value Date   CREATININE 1.13 (H) 08/09/2021   BUN 11 08/09/2021   GFR 56.92 (L) 12/14/2020   GFRNONAA >60 05/29/2021   GFRAA >60 09/23/2019   NA 143 08/09/2021   K 4.1 08/09/2021   CALCIUM 9.3 08/09/2021   CO2 24 08/09/2021   GLUCOSE 81 08/09/2021    Lab Results  Component Value Date/Time   HGBA1C 5.5 03/06/2018 03:26 AM   HGBA1C  06/01/2007 02:05 PM    5.4 (NOTE)   The ADA recommends the following therapeutic goals for glycemic   control related to Hgb A1C measurement:   Goal of Therapy:   < 7.0% Hgb A1C   Action Suggested:  > 8.0% Hgb A1C   Ref:  Diabetes Care, 22, Suppl. 1, 1999   GFR 56.92 (L) 12/14/2020  10:10 AM   GFR 61.59 03/28/2020 03:20 PM    Last diabetic Eye exam: No results found for: HMDIABEYEEXA  Last diabetic Foot exam: No results found for: HMDIABFOOTEX   Lab Results  Component Value Date   CHOL 198 04/20/2020   HDL 61.70 04/20/2020   LDLCALC 123 (H) 04/20/2020   TRIG 62.0 04/20/2020    CHOLHDL 3 04/20/2020    Hepatic Function Latest Ref Rng & Units 05/13/2021 04/20/2020 07/21/2018  Total Protein 6.5 - 8.1 g/dL 6.9 6.6 5.6(L)  Albumin 3.5 - 5.0 g/dL 3.6 4.1 2.8(L)  AST 15 - 41 U/L $Remo'20 16 25  'hwyFu$ ALT 0 - 44 U/L 17 19 47(H)  Alk Phosphatase 38 - 126 U/L 52 70 50  Total Bilirubin 0.3 - 1.2 mg/dL 0.5 0.4 0.3  Bilirubin, Direct 0.0 - 0.3 mg/dL - 0.1 -    Lab Results  Component Value Date/Time   TSH 3.361 05/13/2021 06:42 AM   TSH 3.111 12/14/2020 09:05 PM   TSH 1.80 04/01/2019 03:35 PM   TSH 5.749 Test methodology is 3rd generation TSH(H) 05/31/2007 10:30 PM   FREET4 0.87 05/13/2021 06:42 AM   FREET4 1.02 12/03/2017 11:00 AM    CBC Latest Ref Rng & Units 05/29/2021 05/14/2021 05/13/2021  WBC 4.0 - 10.5 K/uL 12.8(H) 4.1 3.5(L)  Hemoglobin 12.0 - 15.0 g/dL 11.1(L) 10.8(L) 11.8(L)  Hematocrit 36.0 - 46.0 % 33.7(L) 32.0(L) 35.2(L)  Platelets 150 - 400 K/uL 242 241 257    Lab Results  Component Value Date/Time   VD25OH 29.5 (L) 12/03/2017 11:00 AM    Clinical ASCVD: No  The 10-year ASCVD risk score (Arnett DK, et al., 2019) is: 12.9%   Values used to calculate the score:     Age: 35 years     Sex: Female     Is Non-Hispanic African American: Yes     Diabetic: No     Tobacco smoker: No     Systolic Blood Pressure: 470 mmHg     Is BP treated: Yes     HDL Cholesterol: 61.7 mg/dL     Total Cholesterol: 198 mg/dL    Depression screen [facility] 2/9 11/27/2021 10/04/2021 09/12/2021  Decreased Interest 0 0 0  Down, Depressed, Hopeless 0 0 0  PHQ - 2 Score 0 0 0  Altered sleeping - 0 -  Tired, decreased energy - 0 -  Change in appetite - 0 -  Feeling bad or failure about yourself  - 0 -  Trouble concentrating - 0 -  Moving slowly or fidgety/restless - 0 -  Suicidal thoughts - 0 -  PHQ-9 Score - 0 -  Difficult doing work/chores - - -  Some recent data might be hidden      Social History   Tobacco Use  Smoking Status Never  Smokeless Tobacco Never   BP Readings from  Last 3 Encounters:  11/27/21 (!) 165/91  10/30/21 138/76  10/04/21 128/80   Pulse Readings from Last 3 Encounters:  11/27/21 77  10/30/21 84  10/04/21 78   Wt Readings from Last 3 Encounters:  10/30/21 168 lb (76.2 kg)  09/12/21 168 lb (76.2 kg)  07/31/21 168 lb (76.2 kg)   BMI Readings from Last 3 Encounters:  11/27/21 27.96 kg/m  10/30/21 27.96 kg/m  10/04/21 27.96 kg/m    Assessment/Interventions: Review of patient past medical history, allergies, medications, health status, including review of consultants reports, laboratory and other test data, was performed as part of comprehensive evaluation and provision of  chronic care management services.   SDOH:  (Social Determinants of Health) assessments and interventions performed: Yes   SDOH Screenings   Alcohol Screen: Not on file  Depression (PHQ2-9): Low Risk    PHQ-2 Score: 0  Financial Resource Strain: Low Risk    Difficulty of Paying Living Expenses: Not hard at all  Food Insecurity: No Food Insecurity   Worried About Charity fundraiser in the Last Year: Never true   Ran Out of Food in the Last Year: Never true  Housing: Low Risk    Last Housing Risk Score: 0  Physical Activity: Insufficiently Active   Days of Exercise per Week: 2 days   Minutes of Exercise per Session: 40 min  Social Connections: Engineer, building services of Communication with Friends and Family: More than three times a week   Frequency of Social Gatherings with Friends and Family: Twice a week   Attends Religious Services: 1 to 4 times per year   Active Member of [employer] or Organizations: Yes   Attends Archivist Meetings: 1 to 4 times per year   Marital Status: Married  Stress: No Stress Concern Present   Feeling of Stress : Not at all  Tobacco Use: Low Risk    Smoking Tobacco Use: Never   Smokeless Tobacco Use: Never   Passive Exposure: Not on file  Transportation Needs: No Transportation Needs   Lack of Transportation  (Medical): No   Lack of Transportation (Non-Medical): No   Patient was doing water aerobics one day a week at [facility] prior to surgery. 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 Her friend lives < 5 minutes away. She can drive right now but is trying not to do too much after surgery.  She currently has home therapy coming twice a week for 4 more weeks. She is not sleeping very well right now due to the pain. She tries not to take too much of the oxycodone as it makes her feel more groggy in the morning.  She does not have her full appetite back and she is not having constipation. She does drink lots of water, about 3-4 16 ounce bottles a day and drinks hot tea at night.  CCM Care Plan  Allergies  Allergen Reactions   Cephalosporins Anaphylaxis   Penicillins Anaphylaxis and Hives    Did it involve swelling of the face/tongue/throat, SOB, or low BP? Yes Did it involve sudden or severe rash/hives, skin peeling, or any reaction on the inside of your mouth or nose? Yes Did you need to seek medical attention at a hospital or doctor's office? Yes When did it last happen?      10+ years If all above answers are NO, may proceed with cephalosporin use.     Anesthetics, Amide Nausea And Vomiting    Does not know name of it. States they put it on record foot center.    Betadine [Povidone Iodine] Other (See Comments)    "Skin Burn" caused scar on Left buttock   Latex Hives, Itching and Rash   Peach [Prunus Persica] Hives   Claritin [Loratadine]     Rash, pruritis     Medications Reviewed Today     Reviewed by Alda Lea, PT (Physical Therapist) on 12/24/21 at 1233  Med List Status: <None>   Medication Order Taking? Sig Documenting Provider Last Dose Status Informant  ALPRAZolam (XANAX) 0.5 MG tablet [id number] No Take 1/2 to 1 tablet (  0.25-0.5 mg total) by mouth at bedtime as needed for anxiety. [name], [name], MD Taking Active    atorvastatin (LIPITOR) 20 MG tablet [id number] No TAKE 1 TABLET BY MOUTH ONCE DAILY [name], [name], MD Taking Active   baclofen (LIORESAL) 10 MG tablet [id number] No TAKE 1 TABLET BY MOUTH THREE TIMES DAILY AS NEEDED Suella Broad, MD Taking Active   betamethasone dipropionate 0.05 % lotion [id number]  Apply a small amount to affected area once a day   Active   busPIRone (BUSPAR) 5 MG tablet [id number] No Take 1 tablet by mouth 2  times daily.  Patient not taking: Reported on 12/17/2021   [name], [name], MD Not Taking Active   carvedilol (COREG) 25 MG tablet [id number] No Take 2 tablets (50 mg total) by mouth 2 (two) times daily with a meal. Hilty, Nadean Corwin, MD Taking Active   cholecalciferol (VITAMIN D3) 25 MCG (1000 UNIT) tablet [id number] No Take 1,000 Units by mouth daily. [provider] Taking Active Self  diphenhydrAMINE (BENADRYL) 25 MG tablet [id number] No Take 25 mg by mouth daily as needed for itching. [provider] Taking Active   DULoxetine (CYMBALTA) 60 MG capsule [id number] No Take 1 capsule (60 mg total) by mouth daily. [name], [name], MD Taking Active   Estradiol Acetate [facility]) 0.05 MG/24HR RING [id number]  USE ONE RING VAGINALLY EVERY 3 MONTHS AS DIRECTED   Active   gabapentin (NEURONTIN) 400 MG capsule [id number] No Take 1 capsule by mouth three times daily.  Taking Active   methocarbamol (ROBAXIN) 500 MG tablet [id number] No TAKE 1 TABLET BY MOUTH AT BEDTIME AS NEEDED  Taking Active   oxyCODONE-acetaminophen (PERCOCET) 7.5-325 MG tablet [id number] No Take 1 tablet by mouth every 6 (six) hours as needed for severe pain. [provider] Taking Active            Med Note Jerline Pain, ROBBIN M   Wed Nov 27, 2021  9:45 AM) NOT HERE FOR COUNT. LAST TAKEN MAYBE 3 DAYS AGO.  predniSONE (DELTASONE) 20 MG tablet [id number] No Take 1 tab 3 x daily for 4 days then 1 tab 2x daily for 4 days then 1 tab once daily for 4 days, then 1/2 tab daily for 4 days and off.  Patient  not taking: Reported on 12/17/2021   Meredith Staggers, MD Not Taking Active   senna-docusate (SENOKOT-S) 8.6-50 MG tablet [id number] No Take 1 tablet by mouth 2 (two) times daily. Loleta Dicker, PA Taking Active   telmisartan (MICARDIS) 40 MG tablet [id number] No Take 1 tablet (40 mg total) by mouth daily. Pixie Casino, MD Taking Active   traMADol Veatrice Bourbon) 50 MG tablet [id number]  Take 1 tablet (50 mg total) by mouth every 6 (six) hours as needed. Meredith Staggers, MD  Active             Patient Active Problem List   Diagnosis Date Noted   Cervical disc disorder with radiculopathy of cervical region 11/27/2021   Uncontrolled pain 05/27/2021   Syncope 05/13/2021   Depression with anxiety 05/13/2021   Chronic diastolic CHF (congestive heart failure) ([place]) 05/13/2021   Chronic back pain 05/13/2021   Chest tightness 05/13/2021   Lower abdominal pain 05/13/2021   Syncope and collapse 01/01/2021   Abnormal echocardiogram 01/01/2021   BPPV (benign paroxysmal positional vertigo), left 12/26/2020   Odynophagia    Dysphagia 12/15/2020   Postural dizziness with presyncope 12/14/2020   Lumbar post-laminectomy syndrome  Chronic pain 09/28/2019   Lumbar spinal stenosis    Radicular pain    Gait disorder    Difficulty in urination 07/13/2018   Back pain 07/12/2018   Hyperlipidemia 04/16/2018   GAD (generalized anxiety disorder) 04/16/2018   AKI (acute kidney injury) ([place])    Benign essential HTN    Major depression, recurrent ([place])    Lumbar radiculopathy 03/09/2018   Paraparesis ([place]) 03/09/2018   Essential hypertension    Chronic nonintractable headache    Leukocytosis    Acute blood loss anemia    Postoperative pain    Generalized weakness    Spinal stenosis at L4-L5 level 03/04/2018   Spinal stenosis of lumbar region 12/28/2015   Chest pain 04/16/2015    Immunization History  Administered Date(s) Administered   Influenza,inj,Quad PF,6+ Mos 08/03/2018, 08/09/2019,  08/20/2020, 07/04/2021   Influenza,inj,quad, With Preservative 07/27/2017   PFIZER(Purple Top)SARS-COV-2 Vaccination 12/14/2019, 01/05/2020, 07/24/2020, 02/15/2021   Td 10/28/2007   Tdap 10/27/2012, 06/09/2014   Zoster Recombinat (Shingrix) 08/27/2021   Patient has a bulging disc in her neck and has surgery planned for 3/8. Her husband won't be back in town until the 8th so she is trying to move the surgery to the 9th or later.  Her anxiety has been much worse lately and she is taking the alprazolam more consistently. Previously, she was using very sparingly. She thought the buspirone was also just for PRN use for anxiety and didn't realize she had to be taking this consistently to get the most benefit.  Conditions to be addressed/monitored:  Hypertension, Hyperlipidemia, Depression, Anxiety, and Pain  Conditions addressed this visit: Anxiety, hypertension, hyperlipidemia  Care Plan : CCM Pharmacy Care Plan  Updates made by Viona Gilmore, [place] since 12/24/2021 12:00 AM     Problem: Problem: Hypertension, Hyperlipidemia, Depression, Anxiety, and Pain      Long-Range Goal: Patient-Specific Goal   Start Date: 06/11/2021  Expected End Date: 06/11/2022  Recent Progress: On track  Priority: High  Note:   Current Barriers:  Unable to independently monitor therapeutic efficacy Unable to achieve control of cholesterol and blood pressure   Pharmacist Clinical Goal(s):  Patient will achieve adherence to monitoring guidelines and medication adherence to achieve therapeutic efficacy achieve control of cholesterol as evidenced by next lipid panel  through collaboration with PharmD and provider.   Interventions: 1:1 collaboration with [name], [name], MD regarding development and update of comprehensive plan of care as evidenced by provider attestation and co-signature Inter-disciplinary care team collaboration (see longitudinal plan of care) Comprehensive medication review performed;  medication list updated in electronic medical record  Hypertension (BP goal <130/80) -Uncontrolled -Current treatment: Carvedilol 25 mg 2 tablets twice daily - Appropriate, Query effective, Query Safe, Accessible Telmisartan 40 mg 1 tablet daily - Appropriate, Query effective, Safe, Accessible -Medications previously tried: telmisartan-HCTZ -Current home readings: 158/98, nurse is coming out once every 2 weeks; 158/90  -Current dietary habits: cut back on salt; uses more pepper and cinnamon; looks at package labels -Current exercise habits: not able to right now -Denies hypotensive/hypertensive symptoms -Educated on BP goals and benefits of medications for prevention of heart attack, stroke and kidney damage; Exercise goal of 150 minutes per week; Importance of home blood pressure monitoring; Proper BP monitoring technique; -Counseled to monitor BP at home 2-3 times a week, document, and provide log at future appointments -Counseled on diet and exercise extensively Recommended to continue current medication  Hyperlipidemia: (LDL goal < 100) -Uncontrolled -Current treatment: Atorvastatin 20 mg  1 tablet daily - Appropriate, Query effective, Safe, Accessible -Medications previously tried: none  -Current dietary patterns: used to eat out a lot; eating more salads, baked chicken and [place] but does have some fried fish or chicken; eating a mot of fruit and some vegetables -Current exercise habits: not able to -Educated on Cholesterol goals;  Benefits of statin for ASCVD risk reduction; Importance of limiting foods high in cholesterol; Exercise goal of 150 minutes per week; -Counseled on diet and exercise extensively Recommended repeat lipid panel and increasing statin if LDL still above goal.  Depression/Anxiety (Goal: minimize symptoms) -Not ideally controlled -Current treatment: Alprazolam 0.5 mg 1/2-1 tablet daily as needed - Appropriate, Effective, Safe, Accessible Duloxetine 60  mg 1 capsule daily - Appropriate, Query effective, Safe, Accessible Buspirone 5 mg 1 tablet twice daily - Appropriate, Query effective, Safe, Accessible -Medications previously tried/failed: clonazepam -PHQ9: 10 -GAD7: 12 -Educated on Benefits of medication for symptom control Benefits of cognitive-behavioral therapy with or without medication -Counseled on differences between duloxetine and alprazolam for anxiety. Recommended restarting taking buspirone every day as it does not work for as needed use.  Pain (Goal: minimize pain) -Not ideally controlled -Current treatment  Tamadol 50 mg as needed - Appropriate, Effective, Query Safe, Accessible Gabapentin 400 mg 1 capsule 3 times daily - Appropriate, Effective, Safe, Accessible Baclofen 10 mg 1 tablet three times daily as needed - Appropriate, Effective, Safe, Accessible Oxycodone-acetaminophen 7.5-325 mg 1 tablet as needed - Appropriate, Effective, Query Safe, Accessible -Medications previously tried: n/a  -Counseled on risk for sedation and separating these medications.  Health Maintenance -Vaccine gaps: COVID, shingrix, prevnar, influenza -Current therapy:  Femring every 3 months (plan to taper off the Femring)  Meclizine 25 mg as needed (not taken in a while - Vertigo) Benadryl 25 mg as needed Senokot-S 6.8-50 mg as needed -Educated on Cost vs benefit of each product must be carefully weighed by individual consumer -Patient is satisfied with current therapy and denies issues -Counseled on long term risks of estrogen therapy and patient reports the plan is to stop it soon.  Patient Goals/Self-Care Activities Patient will:  - take medications as prescribed check blood pressure at least weekly, document, and provide at future appointments target a minimum of 150 minutes of moderate intensity exercise weekly  Follow Up Plan: Telephone follow up appointment with care management team member scheduled for: 6 months       Medication Assistance: None required.  Patient affirms current coverage meets needs.  Compliance/Adherence/Medication fill history: Care Gaps: Shingrix, COVID BP- 165/91 (11/27/21)  Star-Rating Drugs: Telmisartan - HCTZ 40-12.5 mg - Last filled 07/10/21 90 DS at [facility] (Verified) - stopped Telmisartan 40 mg - Last filled 07/02/21 90 DS at [facility] (Verified) Atorvastatin (Lipitor) 20 mg - Last filled 11/16/21 90 DS at [facility]  Patient's preferred pharmacy is:  [facility] [name] [address] Phone: [phone] Fax: [phone]   Uses pill box? Yes - has an AM/PM medicine Pt endorses 99% compliance  We discussed: Current pharmacy is preferred with insurance plan and patient is satisfied with pharmacy services Patient decided to: Continue current medication management strategy  Care Plan and Follow Up Patient Decision:  Patient agrees to Care Plan and Follow-up.  Plan: Telephone follow up appointment with care management team member scheduled for:  6 months  Jeni Salles, PharmD, [place] Pharmacist [place] [facility] [place] [phone]

## 2021-12-17 NOTE — Therapy (Signed)
[facility] [address] [place], [name], [address] Phone: [phone]   Fax:  [phone]  Physical Therapy Treatment  Patient Details  Name: [name] MRN: [id number] Date of Birth: [birth year] Referring Provider (PT): Loleta Dicker, [place]   Encounter Date: 12/16/2021   PT End of Session - 12/17/21 2103     Visit Number 15    Number of Visits 15    Date for PT Re-Evaluation 12/30/21   treatment to resume 11-04-21 due to holiday schedule   Authorization Type [employer] medicare    Authorization Time Period 07-11-21 - 10-04-21; 10-14-21 - 11-29-21; 12-16-21 - 01-13-22    Progress Note Due on Visit 20    PT Start Time 1545    PT Stop Time 1630    PT Time Calculation (min) 45 min    Equipment Utilized During Treatment Other (comment)   pool noodle (small) and bar bells   Activity Tolerance Patient tolerated treatment well   limited by increased headache with sit to Rt sidelying   Behavior During Therapy [facility] for tasks assessed/performed             Past Medical History:  Diagnosis Date   Anxiety    Aortic atherosclerosis ([place]) 04/16/2021   Atypical angina ([place])    Back pain    related to spinal stenosis and disc problem, radiates down left buttocks to leg., weakness occ.   Chest pain    a. 03/2015 Cath: nl cors; b. 03/2021 Cor CTA: Ca2+ = 0. Nl Cors.   Chronic pain syndrome    Dyspnea    GERD (gastroesophageal reflux disease)    Grade I diastolic dysfunction    Headache    Hyperlipidemia    Hypertension    Lumbar post-laminectomy syndrome    LVH (left ventricular hypertrophy) 12/15/2020   a. 11/2020 Echo: EF 65-70%, no rwma, sev asymm LVH with IVSd 1.9 cm. No LVOT obs @ rest. Gr1 DD. Triv MR.   PONV (postoperative nausea and vomiting)    Pulmonary nodules    a. 03/2021 CT Chest: 50m pulm nodules in bilat lower lobes. F/u 1 yr.   Right foot drop    Syncope    a. 11/2020 Zio: No significant arrhythmias.    Vaginal foreign object    "Uses Femring"    Past Surgical History:  Procedure Laterality Date   ABDOMINAL HYSTERECTOMY     BIOPSY  12/16/2020   Procedure: BIOPSY;  Surgeon: SLadene Artist MD;  Location: [facility]  Service: Endoscopy;;   CARDIAC CATHETERIZATION N/A 04/18/2015   Procedure: Left Heart Cath and Coronary Angiography;  Surgeon: MCharolette Forward MD;  [facility];  Service: Cardiovascular;  Laterality: N/A;   COLONOSCOPY W/ BIOPSIES AND POLYPECTOMY     ESOPHAGOGASTRODUODENOSCOPY (EGD) WITH PROPOFOL N/A 12/16/2020   Procedure: ESOPHAGOGASTRODUODENOSCOPY (EGD) WITH PROPOFOL;  Surgeon: SLadene Artist MD;  [facility];  Service: Endoscopy;  Laterality: N/A;   FOOT SURGERY Bilateral    TM[place]"bunion,bone spur, tendon" (1) -6'16, (1)-10'16   IR EPIDUROGRAPHY  07/21/2018   LUMBAR LAMINECTOMY/DECOMPRESSION MICRODISCECTOMY Bilateral 12/28/2015   Procedure: MICRO LUMBAR DECOMPRESSION L4 - L5 BILATERALLY;  Surgeon: JSusa Day MD;  [facility];  Service: Orthopedics;  Laterality: Bilateral;   LUMBAR LAMINECTOMY/DECOMPRESSION MICRODISCECTOMY Bilateral 03/04/2018   Procedure: Revision of Microlumbar Decompression Bilateral Lumbar Four-Five;  Surgeon: BSusa Day MD;  [facility]  Service: Orthopedics;  Laterality: Bilateral;  90 mins  SAVORY DILATION N/A 12/16/2020   Procedure: SAVORY DILATION;  Surgeon: Ladene Artist, MD;  [facility];  Service: Endoscopy;  Laterality: N/A;   SPINAL CORD STIMULATOR INSERTION N/A 09/28/2019   Procedure: THORACIC SPINAL CORD STIMULATOR INSERTION;  Surgeon: Melina Schools, MD;  Location: [facility];  Service: Orthopedics;  Laterality: N/A;  2.5 hrs   SPINAL CORD STIMULATOR REMOVAL N/A 05/27/2021   Procedure: LUMBAR SPINAL CORD STIMULATOR REMOVAL;  Surgeon: Deetta Perla, MD;  Location: [facility];  Service: Neurosurgery;  Laterality: N/A;   TUBAL LIGATION     WISDOM TOOTH EXTRACTION     WOUND  EXPLORATION N/A 03/04/2018   Procedure: EXPLORATION OF LUMBAR DECOMPRESSION WOUND;  Surgeon: [name], MD;  Location: [facility];  Service: Orthopedics;  Laterality: N/A;    There were no vitals filed for this visit.   Subjective Assessment - 12/17/21 2102     Subjective Pt reports Lt arm pain is a burning pain - is waiting to hear when her surgery will be scheduled (C5-6 ACDF)    Pertinent History Spinal cord stimulator insertion on 09-28-19; stimulator removal on 05-27-21 with resultant LLE weakness;  decompression and laminectomy on 12/28/15 at L4-L5  with redo decompression surgery on May 9,2019, post-op paraparesis with surgery to remove small hematoma; 07/12/18 fall at work (involved rolling chair) with hospital admission, transfer to [place] with d/c home 08/03/18; HTN, Bil foot surgery; left ventricular hypertrophy, chronic pain syndrome, dyspnea, hypertrophic cardiomyopathy    Patient Stated Goals "To be able to move left leg like I was able to before I had the surgery on 05-27-21"    Currently in Pain? Yes    Pain Score 8     Pain Location Arm    Pain Orientation Left    Pain Descriptors / Indicators Burning    Pain Type Neuropathic pain    Pain Onset 1 to 4 weeks ago    Pain Frequency Constant               Aquatic therapy at [facility]  - pool temp 92 degrees   Patient seen for aquatic therapy today.  Treatment took place in water 3.5-4 feet deep depending upon activity.  Pt entered and  Exited the pool via step negotiation with use of bil. Hand rails with SBA  Pt performed bil. Hamstring and heel cord stretching 30 secs x 1 rep each leg (runner's stretch)  Pt performed forward amb. 18' x approx. 4 reps across pool with bil. UE support on pool noodle  Performed side stepping 18'  x 4 reps with bil. UE support on pool noodle with min assist for stabilization by PT; backwards amb. 18' x 2 reps with CGA with UE support on noodle  Pt performed LUE AROM and strengthening exercises -  used small bar bells - shoulder protraction/retraction 10 reps: shoulder flexion/extension with fingers adducted for increased resistance 10 reps:  horizontal shoulder abduction/adduction 10 reps with small bar bells; Lt shoulder abduction/adduction 10 reps with fingers adducted for increased resistance of water for strengthening  Small range modified push up against pool wall - 10 reps  Pt performed buoyancy assisted Lt shoulder ROM exercises - flexion, abduction and extension 10 reps each to assist with reducing Lt shoulder and arm pain   Pt requires aquatic therapy for the unweighting provided by the buoyancy of the water and needs the viscosity for strengthening LE's and trunk musc.; buoyancy of water needed for spinal decompression for increased tolerance for movement with  less pain experienced than with land exercises                                  PT Short Term Goals - 11/26/21 1656       PT SHORT TERM GOAL #1   Title Pt will perform sit to stand transfers 5 reps with bil. UE support in </= 26 secs to demo improved LLE strength.    Baseline 3 reps - 32.72 secs with bil. UE support;   1st trial 1.00";  28.28 secs with bil. UE support on 2nd trial    Time 4    Period Weeks    Status On-going    Target Date 09/06/21      PT SHORT TERM GOAL #2   Title Pt will improve TUG score from 39.78 secs to </= 34 secs with rollator to demo improved functional mobility. UPGRADE:  score </= 24.5 secs with rollator    Baseline 39.78 secs with rollator;  28.12 secs with rollator    Time 4    Period Weeks    Status Revised    Target Date 09/06/21      PT SHORT TERM GOAL #3   Title Pt will amb. 4" nonstop with use of rollator with SBA for increased endurance and to demo improved LE strength.    Baseline pt amb. 3" 49 secs with rollator    Time 4    Period Weeks    Status On-going    Target Date 09/06/21      PT SHORT TERM GOAL #4   Title Assess step negotiation  with use of handrails.    Time 4    Period Weeks    Status Achieved    Target Date 08/09/21      PT SHORT TERM GOAL #5   Title Pt will stand for 30 secs without UE support with SBA for increased independence with ADL's. UPGRADE - stand for 1.5" without UE support    Time 4    Period Weeks    Status Revised    Target Date 09/06/21      PT SHORT TERM GOAL #6   Title Independent in HEP for LLE strengthening and balance exercises.    Time 4    Period Weeks    Status Achieved    Target Date 08/09/21               PT Long Term Goals - 12/17/21 2105       PT LONG TERM GOAL #1   Title Pt will amb. short distances, i.e. 30' with use of SPC with SBA.    Baseline Pt continues to use rollator for assistance with ambulation to pool area due to distance of  200' from parking lot to pool area    Time 12    Period Weeks    Status Not Met    Target Date 11/29/21      PT LONG TERM GOAL #2   Title Improve TUG score to </= 26 secs with use of rollator to demo improved functional mobility and reduced fall risk.    Baseline 39.78 secs with rollator; goal deferred as pt requested to do aquatic therapy only    Time 12    Period Weeks    Status Deferred      PT LONG TERM GOAL #3   Title Pt will increase gait velocity from 1.02 ft/sec to >/=  1.8 ft/sec with rollator for increased gait efficiency.    Baseline 32.16 secs = 1.02 ft/sec with rollator on 07-11-21; unable to assess as pt is receiving aquatic therapy only - 10-14-21    Time 12    Period Weeks    Status Deferred      PT LONG TERM GOAL #4   Title Pt will stand for at least 10" with UE support prn for increased independence with ADL's.    Baseline met 10-14-21    Time 12    Period Weeks    Status Achieved      PT LONG TERM GOAL #5   Title Pt will be independent in HEP for strengthening & balance exercises. Revised; Independent in updated HEP to include aquatic exercises.    Baseline met 10-14-21    Time 12    Period Weeks     Status On-going    Target Date 12/30/21                   Plan - 12/17/21 2106     Clinical Impression Statement Pt has not met LTG's due to onset of LUE pain due to disc herniation C5-6, with ACDF surgery to be scheduled within next 2 weeks.  Pt continues to use rollator for assistance with ambulation - is unable to weight bear excessively on LUE due to pain.  Plan to renew for 3 sessions to assist with LUE pain and to maintain ROM prior to scheduled surgery in upcoming weeks. Cont with POC.    Personal Factors and Comorbidities Past/Current Experience;Comorbidity 2;Time since onset of injury/illness/exacerbation;Sex    Comorbidities hypertrophic cardiomyopathy, left ventricular hypertrophy, post laminectomy syndrome, chronic pain syndrome, dyspnea, s/p spinal cord stimulator removal    Examination-Activity Limitations Stand;Locomotion Level;Bend;Squat;Stairs;Transfers;Carry;Reach Overhead    Examination-Participation Restrictions Cleaning;Community Activity;Meal Prep;Shop;Laundry;Church;Driving    Stability/Clinical Decision Making Evolving/Moderate complexity    Rehab Potential Good    PT Frequency 1x / week    PT Duration 4 weeks   4 additional aquatic therapy sessions requested   PT Treatment/Interventions ADLs/Self Care Home Management;Neuromuscular re-education;Balance training;Patient/family education;Aquatic Therapy;Scientist, product/process development;Therapeutic exercise;Stair training;Gait training;Orthotic Fit/Training    PT Next Visit Plan Cont with aquatic therapy for bil. LE strengthening    Consulted and Agree with Plan of Care Patient             Patient will benefit from skilled therapeutic intervention in order to improve the following deficits and impairments:  Pain, Difficulty walking, Decreased strength, Decreased activity tolerance, Abnormal gait, Increased muscle spasms, Decreased endurance, Decreased balance, Decreased coordination, Decreased  knowledge of use of DME, Decreased mobility, Decreased safety awareness, Impaired tone  Visit Diagnosis: Other abnormalities of gait and mobility - Plan: PT plan of care cert/re-cert  Muscle weakness (generalized) - Plan: PT plan of care cert/re-cert  Unsteadiness on feet - Plan: PT plan of care cert/re-cert  Pain in left arm - Plan: PT plan of care cert/re-cert     Problem List Patient Active Problem List   Diagnosis Date Noted   Cervical disc disorder with radiculopathy of cervical region 11/27/2021   Uncontrolled pain 05/27/2021   Syncope 05/13/2021   Depression with anxiety 05/13/2021   Chronic diastolic CHF (congestive heart failure) ([place]) 05/13/2021   Chronic back pain 05/13/2021   Chest tightness 05/13/2021   Lower abdominal pain 05/13/2021   Syncope and collapse 01/01/2021   Abnormal echocardiogram 01/01/2021   BPPV (benign paroxysmal positional vertigo), left 12/26/2020   Odynophagia  Dysphagia 12/15/2020   Postural dizziness with presyncope 12/14/2020   Lumbar post-laminectomy syndrome    Chronic pain 09/28/2019   Lumbar spinal stenosis    Radicular pain    Gait disorder    Difficulty in urination 07/13/2018   Back pain 07/12/2018   Hyperlipidemia 04/16/2018   GAD (generalized anxiety disorder) 04/16/2018   AKI (acute kidney injury) ([place])    Benign essential HTN    Major depression, recurrent ([place])    Lumbar radiculopathy 03/09/2018   Paraparesis ([place]) 03/09/2018   Essential hypertension    Chronic nonintractable headache    Leukocytosis    Acute blood loss anemia    Postoperative pain    Generalized weakness    Spinal stenosis at L4-L5 level 03/04/2018   Spinal stenosis of lumbar region 12/28/2015   Chest pain 04/16/2015    Alda Lea, PT 12/17/2021, 9:16 PM  [facility] [address] [place], [name], [address] Phone: [phone]   Fax:  [phone]  Name: Karlissa Aron MRN: [id number] Date of Birth: 01-May-1961

## 2021-12-23 ENCOUNTER — Other Ambulatory Visit: Payer: Self-pay

## 2021-12-23 ENCOUNTER — Ambulatory Visit: Payer: PPO | Admitting: Physical Therapy

## 2021-12-23 DIAGNOSIS — R2689 Other abnormalities of gait and mobility: Secondary | ICD-10-CM | POA: Diagnosis not present

## 2021-12-23 DIAGNOSIS — M6281 Muscle weakness (generalized): Secondary | ICD-10-CM

## 2021-12-24 DIAGNOSIS — I10 Essential (primary) hypertension: Secondary | ICD-10-CM

## 2021-12-24 DIAGNOSIS — F411 Generalized anxiety disorder: Secondary | ICD-10-CM | POA: Diagnosis not present

## 2021-12-24 NOTE — Therapy (Signed)
[facility] [address] [place], [name], [address] Phone: [phone]   Fax:  [phone]  Physical Therapy Treatment & Discharge Summary  Patient Details  Name: [name] MRN: [id number] Date of Birth: 1960/11/01 Referring Provider (PT): Loleta Dicker, [place]   Encounter Date: 12/23/2021   PT End of Session - 12/24/21 1238     Visit Number 16    Number of Visits 16    Date for PT Re-Evaluation 12/30/21    Authorization Type H[employer] medicare    Authorization Time Period 07-11-21 - 10-04-21; 10-14-21 - 11-29-21; 12-16-21 - 01-13-22    Progress Note Due on Visit 20    PT Start Time 1545    PT Stop Time 1635    PT Time Calculation (min) 50 min    Equipment Utilized During Treatment Other (comment)   bar bells, pool noodle   Activity Tolerance Patient tolerated treatment well    Behavior During Therapy [facility] for tasks assessed/performed             Past Medical History:  Diagnosis Date   Anxiety    Aortic atherosclerosis ([place]) 04/16/2021   Atypical angina ([facility])    Back pain    related to spinal stenosis and disc problem, radiates down left buttocks to leg., weakness occ.   Chest pain    a. 03/2015 Cath: nl cors; b. 03/2021 Cor CTA: Ca2+ = 0. Nl Cors.   Chronic pain syndrome    Dyspnea    GERD (gastroesophageal reflux disease)    Grade I diastolic dysfunction    Headache    Hyperlipidemia    Hypertension    Lumbar post-laminectomy syndrome    LVH (left ventricular hypertrophy) 12/15/2020   a. 11/2020 Echo: EF 65-70%, no rwma, sev asymm LVH with IVSd 1.9 cm. No LVOT obs @ rest. Gr1 DD. Triv MR.   PONV (postoperative nausea and vomiting)    Pulmonary nodules    a. 03/2021 CT Chest: 22mm pulm nodules in bilat lower lobes. F/u 1 yr.   Right foot drop    Syncope    a. 11/2020 Zio: No significant arrhythmias.   Vaginal foreign object    "Uses Femring"    Past Surgical History:  Procedure Laterality Date    ABDOMINAL HYSTERECTOMY     BIOPSY  12/16/2020   Procedure: BIOPSY;  Surgeon: Ladene Artist, MD;  Location: [facility];  Service: Endoscopy;;   CARDIAC CATHETERIZATION N/A 04/18/2015   Procedure: Left Heart Cath and Coronary Angiography;  Surgeon: [name], MD;  Location: [facility];  Service: Cardiovascular;  Laterality: N/A;   COLONOSCOPY W/ BIOPSIES AND POLYPECTOMY     ESOPHAGOGASTRODUODENOSCOPY (EGD) WITH PROPOFOL N/A 12/16/2020   Procedure: ESOPHAGOGASTRODUODENOSCOPY (EGD) WITH PROPOFOL;  Surgeon: Ladene Artist, MD;  Location: [facility];  Service: Endoscopy;  Laterality: N/A;   FOOT SURGERY Bilateral    Arnot "bunion,bone spur, tendon" (1) -6'16, (1)-10'16   IR EPIDUROGRAPHY  07/21/2018   LUMBAR LAMINECTOMY/DECOMPRESSION MICRODISCECTOMY Bilateral 12/28/2015   Procedure: MICRO LUMBAR DECOMPRESSION L4 - L5 BILATERALLY;  Surgeon: [name], MD;  Location: [facility];  Service: Orthopedics;  Laterality: Bilateral;   LUMBAR LAMINECTOMY/DECOMPRESSION MICRODISCECTOMY Bilateral 03/04/2018   Procedure: Revision of Microlumbar Decompression Bilateral Lumbar Four-Five;  Surgeon: [name], MD;  Location: [facility];  Service: Orthopedics;  Laterality: Bilateral;  90 mins   SAVORY DILATION N/A 12/16/2020   Procedure: SAVORY DILATION;  Surgeon: Ladene Artist, MD;  Location:  [facility];  Service: Endoscopy;  Laterality: N/A;   SPINAL CORD STIMULATOR INSERTION N/A 09/28/2019   Procedure: THORACIC SPINAL CORD STIMULATOR INSERTION;  Surgeon: Venita Lick, MD;  Location: [facility];  Service: Orthopedics;  Laterality: N/A;  2.5 hrs   SPINAL CORD STIMULATOR REMOVAL N/A 05/27/2021   Procedure: LUMBAR SPINAL CORD STIMULATOR REMOVAL;  Surgeon: Lucy Chris, MD;  Location: [facility];  Service: Neurosurgery;  Laterality: N/A;   TUBAL LIGATION     WISDOM TOOTH EXTRACTION     WOUND EXPLORATION N/A 03/04/2018   Procedure: EXPLORATION OF LUMBAR DECOMPRESSION WOUND;  Surgeon: Jene Every,  MD;  Location: [facility];  Service: Orthopedics;  Laterality: N/A;    There were no vitals filed for this visit.   Subjective Assessment - 12/24/21 1233     Subjective Pt reports she is scheduled for surgery next Friday, 01-03-22; states today will be her last day for pool therapy until she gets a new order for aquatic PT    Pertinent History Spinal cord stimulator insertion on 09-28-19; stimulator removal on 05-27-21 with resultant LLE weakness;  decompression and laminectomy on 12/28/15 at L4-L5  with redo decompression surgery on May 9,2019, post-op paraparesis with surgery to remove small hematoma; 07/12/18 fall at work (involved rolling chair) with hospital admission, transfer to CIR with d/c home 08/03/18; HTN, Bil foot surgery; left ventricular hypertrophy, chronic pain syndrome, dyspnea, hypertrophic cardiomyopathy    Patient Stated Goals "To be able to move left leg like I was able to before I had the surgery on 05-27-21"    Currently in Pain? Yes    Pain Score 7     Pain Location Arm    Pain Orientation Left    Pain Descriptors / Indicators Burning    Pain Type Neuropathic pain    Pain Onset 1 to 4 weeks ago    Pain Frequency Constant                Aquatic therapy at [facility]  - pool temp 89 degrees   Patient seen for aquatic therapy today.  Treatment took place in water 3.5-4 feet deep depending upon activity.  Pt entered and  Exited the pool via step negotiation with use of bil. Hand rails with SBA  Pt performed bil. Hamstring and heel cord stretching 30 secs x 1 rep each leg (runner's stretch)  Pt performed forward amb. 18' x approx. 6 reps across pool with bil. UE support on pool noodle  Performed side stepping 18'  x 2 reps with bil. UE support on pool noodle with min assist for stabilization by PT; backwards amb. 18' x 2 reps with CGA with UE support on noodle  Pt performed LUE AROM and strengthening exercises - used small bar bells - shoulder protraction/retraction 10  reps: shoulder flexion/extension with fingers adducted for increased resistance 10 reps:  horizontal shoulder abduction/adduction 10 reps with small bar bells; Lt shoulder abduction/adduction 10 reps with fingers adducted for increased resistance of water for strengthening  Small range modified push up against pool wall - 10 reps  Pt performed buoyancy assisted Lt shoulder ROM exercises - flexion, abduction and extension 10 reps each to assist with reducing Lt shoulder and arm pain   Pt requires aquatic therapy for the unweighting provided by the buoyancy of the water and needs the viscosity for strengthening LE's and trunk musc.; buoyancy of water needed for spinal decompression for increased tolerance for movement with less pain experienced than with land exercises  PT Short Term Goals - 12/24/21 1240       PT SHORT TERM GOAL #1   Title Pt will perform sit to stand transfers 5 reps with bil. UE support in </= 26 secs to demo improved LLE strength.    Baseline 3 reps - 32.72 secs with bil. UE support;   1st trial 1.00";  28.28 secs with bil. UE support on 2nd trial    Time 4    Period Weeks    Status On-going    Target Date 09/06/21      PT SHORT TERM GOAL #2   Title Pt will improve TUG score from 39.78 secs to </= 34 secs with rollator to demo improved functional mobility. UPGRADE:  score </= 24.5 secs with rollator    Baseline 39.78 secs with rollator;  28.12 secs with rollator    Time 4    Period Weeks    Status Revised    Target Date 09/06/21      PT SHORT TERM GOAL #3   Title Pt will amb. 4" nonstop with use of rollator with SBA for increased endurance and to demo improved LE strength.    Baseline pt amb. 3" 49 secs with rollator    Time 4    Period Weeks    Status On-going    Target Date 09/06/21      PT SHORT TERM GOAL #4   Title Assess step negotiation with use of handrails.    Time 4    Period Weeks     Status Achieved    Target Date 08/09/21      PT SHORT TERM GOAL #5   Title Pt will stand for 30 secs without UE support with SBA for increased independence with ADL's. UPGRADE - stand for 1.5" without UE support    Time 4    Period Weeks    Status Revised    Target Date 09/06/21      PT SHORT TERM GOAL #6   Title Independent in HEP for LLE strengthening and balance exercises.    Time 4    Period Weeks    Status Achieved    Target Date 08/09/21               PT Long Term Goals - 12/24/21 1240       PT LONG TERM GOAL #1   Title Pt will amb. short distances, i.e. 11' with use of SPC with SBA.    Baseline Pt continues to use rollator for assistance with ambulation to pool area due to distance of  200' from parking lot to pool area    Time 12    Period Weeks    Status Not Met    Target Date 11/29/21      PT LONG TERM GOAL #2   Title Improve TUG score to </= 26 secs with use of rollator to demo improved functional mobility and reduced fall risk.    Baseline 39.78 secs with rollator; goal deferred as pt requested to do aquatic therapy only    Time 12    Period Weeks    Status Deferred      PT LONG TERM GOAL #3   Title Pt will increase gait velocity from 1.02 ft/sec to >/= 1.8 ft/sec with rollator for increased gait efficiency.    Baseline 32.16 secs = 1.02 ft/sec with rollator on 07-11-21; unable to assess as pt is receiving aquatic therapy only - 10-14-21    Time 12  Period Weeks    Status Deferred      PT LONG TERM GOAL #4   Title Pt will stand for at least 10" with UE support prn for increased independence with ADL's.    Baseline met 10-14-21    Time 12    Period Weeks    Status Achieved      PT LONG TERM GOAL #5   Title Pt will be independent in HEP for strengthening & balance exercises. Revised; Independent in updated HEP to include aquatic exercises.    Baseline met 10-14-21    Time 12    Period Weeks    Status Achieved    Target Date 12/30/21                    Plan - 12/24/21 1241     Clinical Impression Statement Pt has not fully met any LTG's as she continues to have LUE pain due to C5-6 disc herniation with ACDF surgery scheduled for Friday, 01-03-22.  Pt continues to use rollator for assistance with ambulation.  Pt is discharged due to change in medical status with ACDF sx scheduled on 01-03-22.    Personal Factors and Comorbidities Past/Current Experience;Comorbidity 2;Time since onset of injury/illness/exacerbation;Sex    Comorbidities hypertrophic cardiomyopathy, left ventricular hypertrophy, post laminectomy syndrome, chronic pain syndrome, dyspnea, s/p spinal cord stimulator removal    Examination-Activity Limitations Stand;Locomotion Level;Bend;Squat;Stairs;Transfers;Carry;Reach Overhead    Examination-Participation Restrictions Cleaning;Community Activity;Meal Prep;Shop;Laundry;Church;Driving    Stability/Clinical Decision Making Evolving/Moderate complexity    Rehab Potential Good    PT Frequency 1x / week    PT Duration 4 weeks   4 additional aquatic therapy sessions requested   PT Treatment/Interventions ADLs/Self Care Home Management;Neuromuscular re-education;Balance training;Patient/family education;Aquatic Therapy;Psychologist, educational;Therapeutic exercise;Stair training;Gait training;Orthotic Fit/Training    PT Next Visit Plan D/C on 12-23-21    Consulted and Agree with Plan of Care Patient             Patient will benefit from skilled therapeutic intervention in order to improve the following deficits and impairments:  Pain, Difficulty walking, Decreased strength, Decreased activity tolerance, Abnormal gait, Increased muscle spasms, Decreased endurance, Decreased balance, Decreased coordination, Decreased knowledge of use of DME, Decreased mobility, Decreased safety awareness, Impaired tone  Visit Diagnosis: Other abnormalities of gait and mobility  Muscle weakness  (generalized)     Problem List Patient Active Problem List   Diagnosis Date Noted   Cervical disc disorder with radiculopathy of cervical region 11/27/2021   Uncontrolled pain 05/27/2021   Syncope 05/13/2021   Depression with anxiety 05/13/2021   Chronic diastolic CHF (congestive heart failure) (HCC) 05/13/2021   Chronic back pain 05/13/2021   Chest tightness 05/13/2021   Lower abdominal pain 05/13/2021   Syncope and collapse 01/01/2021   Abnormal echocardiogram 01/01/2021   BPPV (benign paroxysmal positional vertigo), left 12/26/2020   Odynophagia    Dysphagia 12/15/2020   Postural dizziness with presyncope 12/14/2020   Lumbar post-laminectomy syndrome    Chronic pain 09/28/2019   Lumbar spinal stenosis    Radicular pain    Gait disorder    Difficulty in urination 07/13/2018   Back pain 07/12/2018   Hyperlipidemia 04/16/2018   GAD (generalized anxiety disorder) 04/16/2018   AKI (acute kidney injury) (HCC)    Benign essential HTN    Major depression, recurrent (HCC)    Lumbar radiculopathy 03/09/2018   Paraparesis (HCC) 03/09/2018   Essential hypertension    Chronic nonintractable headache    Leukocytosis  Acute blood loss anemia    Postoperative pain    Generalized weakness    Spinal stenosis at L4-L5 level 03/04/2018   Spinal stenosis of lumbar region 12/28/2015   Chest pain 04/16/2015    PHYSICAL THERAPY DISCHARGE SUMMARY  Visits from Start of Care: 16  Current functional level related to goals / functional outcomes: See above for progress towards goals - no LTG's fully met due to change in medical status   Remaining deficits: Pt continues to have LUE radicular pain due to C5-6 disc herniation - ACDF sx is scheduled on 01-03-22 Pt continues to amb. With rollator due to bil. LE weakness with RLE weaker than LLE   Education / Equipment: Pt has been instructed in HEP for LE strengthening and stretching exercises  Patient agrees to discharge. Patient goals  were not met. Patient is being discharged due to a change in medical status. (ACDF sx scheduled on Friday, 01-03-22).    Alda Lea, PT 12/24/2021, 12:56 PM  [facility] [address] [place], [name], [address] Phone: [phone]   Fax:  [phone]  Name: [name] MRN: [id number] Date of Birth: 1961-09-17

## 2021-12-24 NOTE — Patient Instructions (Signed)
Hi [name],  It was great to get to catch up with you again! Don't forget to take the buspirone as prescribed rather than as needed like we discussed as it doesn't help with anxiety in that way.  Please reach out to me if you have any questions or need anything before our follow up!  Best, Maddie  Jeni Salles, PharmD, [place] Pharmacist [facility]   Visit Information   Goals Addressed   None    Patient Care Plan: [facility] Pharmacy Care Plan     Problem Identified: Problem: Hypertension, Hyperlipidemia, Depression, Anxiety, and Pain      Long-Range Goal: Patient-Specific Goal   Start Date: 06/11/2021  Expected End Date: 06/11/2022  Recent Progress: On track  Priority: High  Note:   Current Barriers:  Unable to independently monitor therapeutic efficacy Unable to achieve control of cholesterol and blood pressure   Pharmacist Clinical Goal(s):  Patient will achieve adherence to monitoring guidelines and medication adherence to achieve therapeutic efficacy achieve control of cholesterol as evidenced by next lipid panel  through collaboration with PharmD and provider.   Interventions: 1:1 collaboration with [name], [name], MD regarding development and update of comprehensive plan of care as evidenced by provider attestation and co-signature Inter-disciplinary care team collaboration (see longitudinal plan of care) Comprehensive medication review performed; medication list updated in electronic medical record  Hypertension (BP goal <130/80) -Uncontrolled -Current treatment: Carvedilol 25 mg 2 tablets twice daily - Appropriate, Query effective, Query Safe, Accessible Telmisartan 40 mg 1 tablet daily - Appropriate, Query effective, Safe, Accessible -Medications previously tried: telmisartan-HCTZ -Current home readings: 158/98, nurse is coming out once every 2 weeks; 158/90  -Current dietary habits: cut back on salt; uses more pepper  and cinnamon; looks at package labels -Current exercise habits: not able to right now -Denies hypotensive/hypertensive symptoms -Educated on BP goals and benefits of medications for prevention of heart attack, stroke and kidney damage; Exercise goal of 150 minutes per week; Importance of home blood pressure monitoring; Proper BP monitoring technique; -Counseled to monitor BP at home 2-3 times a week, document, and provide log at future appointments -Counseled on diet and exercise extensively Recommended to continue current medication  Hyperlipidemia: (LDL goal < 100) -Uncontrolled -Current treatment: Atorvastatin 20 mg 1 tablet daily - Appropriate, Query effective, Safe, Accessible -Medications previously tried: none  -Current dietary patterns: used to eat out a lot; eating more salads, baked chicken and [place] but does have some fried fish or chicken; eating a mot of fruit and some vegetables -Current exercise habits: not able to -Educated on Cholesterol goals;  Benefits of statin for ASCVD risk reduction; Importance of limiting foods high in cholesterol; Exercise goal of 150 minutes per week; -Counseled on diet and exercise extensively Recommended repeat lipid panel and increasing statin if LDL still above goal.  Depression/Anxiety (Goal: minimize symptoms) -Not ideally controlled -Current treatment: Alprazolam 0.5 mg 1/2-1 tablet daily as needed - Appropriate, Effective, Safe, Accessible Duloxetine 60 mg 1 capsule daily - Appropriate, Query effective, Safe, Accessible Buspirone 5 mg 1 tablet twice daily - Appropriate, Query effective, Safe, Accessible -Medications previously tried/failed: clonazepam -PHQ9: 10 -GAD7: 12 -Educated on Benefits of medication for symptom control Benefits of cognitive-behavioral therapy with or without medication -Counseled on differences between duloxetine and alprazolam for anxiety. Recommended restarting taking buspirone every day as it does not  work for as needed use.  Pain (Goal: minimize pain) -Not ideally controlled -Current treatment  Tamadol 50 mg as needed -  Appropriate, Effective, Query Safe, Accessible Gabapentin 400 mg 1 capsule 3 times daily - Appropriate, Effective, Safe, Accessible Baclofen 10 mg 1 tablet three times daily as needed - Appropriate, Effective, Safe, Accessible Oxycodone-acetaminophen 7.5-325 mg 1 tablet as needed - Appropriate, Effective, Query Safe, Accessible -Medications previously tried: n/a  -Counseled on risk for sedation and separating these medications.  Health Maintenance -Vaccine gaps: COVID, shingrix, prevnar, influenza -Current therapy:  Femring every 3 months (plan to taper off the Femring)  Meclizine 25 mg as needed (not taken in a while - Vertigo) Benadryl 25 mg as needed Senokot-S 6.8-50 mg as needed -Educated on Cost vs benefit of each product must be carefully weighed by individual consumer -Patient is satisfied with current therapy and denies issues -Counseled on long term risks of estrogen therapy and patient reports the plan is to stop it soon.  Patient Goals/Self-Care Activities Patient will:  - take medications as prescribed check blood pressure at least weekly, document, and provide at future appointments target a minimum of 150 minutes of moderate intensity exercise weekly  Follow Up Plan: Telephone follow up appointment with care management team member scheduled for: 6 months       Patient verbalizes understanding of instructions and care plan provided today and agrees to view in [facility]. Active MyChart status confirmed with patient.   The pharmacy team will reach out to the patient again over the next 30 days.   Viona Gilmore, [facility]

## 2021-12-30 NOTE — Progress Notes (Signed)
Surgical Instructions ? ? ?Your procedure is scheduled on Friday 01/03/2022. ? ?Report to [facility] Main Entrance "A" at 05:30 A.M., then check in with the Admitting office. ? ?Call [phone] if you have problems or questions between now and the morning of surgery: ?  ?Remember: ?Do not eat or drink after midnight the night before your surgery ? ?  ?Take these medicines the morning of surgery with A SIP OF WATER:  ?Atorvastatin (Lipitor) ?Carvedilol (Coreg) ?Gabapentin (Neurontin) ? ? ?If needed you may take these medications the morning of surgery: ?Acetaminophen (Tylenol) ?Baclofen (Lioresal) ?Buspirone (Buspar) ?Diphenhydramine (Benadryl) ?Meclizine (Antivert) ?Oxycodone-acetaminophen (Percocet) for severe pain ?Tramadol (Ultram) ? ?As of today, STOP taking any Aspirin (unless otherwise instructed by your surgeon) or Aspirin-containing products; NSAIDS - Aleve, Naproxen, Ibuprofen, Motrin, Advil, Goody's, BC's, all herbal medications, fish oil, and all vitamins. ? ? ?After your pre-procedure COVID test ? ?You are not required to quarantine however you are required to wear a well-fitting mask when you are out and around people not in your household.  If your mask becomes wet or soiled, replace with a new one. ? ?Wash your hands often with soap and water for 20 seconds or clean your hands with an alcohol-based hand sanitizer that contains at least 60% alcohol. ? ?Do not share personal items. ? ?Notify your provider: ?if you are in close contact with someone who has COVID  ?or if you develop a fever of 100.4 or greater, sneezing, cough, sore throat, shortness of breath or body aches. ?         ?Do not wear jewelry or makeup ? ?Do not wear lotions, powders, perfumes, or deodorant. ? ?Do not shave 48 hours prior to surgery.   ? ?Do not wear nail polish, gel polish, artificial nails, or any other type of covering on natural nails including fingernails and toenails. ?If patients have artificial nails, gel coating,  etc. that need to be removed by a nail salon please have this removed prior to surgery or surgery may need to be canceled/delayed if the surgeon/ anesthesia feels like the patient is unable to be adequately monitored. ? ?Do not bring valuables to the hospital - [facility] is not responsible for any belongings or valuables. ? ?Do NOT Smoke (Tobacco/Vaping) or drink Alcohol 24 hours prior to your procedure ? ?If you use a CPAP at night, please bring your mask for your overnight stay. ?  ?Contacts, glasses, hearing aids, dentures or partials may not be worn into surgery, please bring cases for these belongings ?  ?For patients admitted to the hospital, discharge time will be determined by your treatment team. ?  ?Patients discharged the day of surgery will not be allowed to drive home, and someone needs to stay with them for 24 hours. ? ?NO VISITORS WILL BE ALLOWED IN PRE-OP WHERE PATIENTS ARE PREPPED FOR SURGERY.  ONLY 1 SUPPORT PERSON MAY BE PRESENT IN THE WAITING ROOM WHILE YOU ARE IN SURGERY.  IF YOU ARE TO BE ADMITTED, ONCE YOU ARE IN YOUR ROOM YOU WILL BE ALLOWED TWO (2) VISITORS. 1 (ONE) VISITOR MAY STAY OVERNIGHT BUT MUST ARRIVE TO THE ROOM BY 8pm.  Minor children may have two parents present. Special consideration for safety and communication needs will be reviewed on a case by case basis. ? ?Special instructions:   ? ?Oral Hygiene is also important to reduce your risk of infection.  Remember - BRUSH YOUR TEETH THE MORNING OF SURGERY WITH YOUR REGULAR TOOTHPASTE ? ? ?  [facility]- Preparing For Surgery ? ?Before surgery, you can play an important role. Because skin is not sterile, your skin needs to be as free of germs as possible. You can reduce the number of germs on your skin by washing with CHG (chlorahexidine gluconate) Soap before surgery.  CHG is an antiseptic cleaner which kills germs and bonds with the skin to continue killing germs even after washing.   ? ? ?Please do not use if you have an allergy  to CHG or antibacterial soaps. If your skin becomes reddened/irritated stop using the CHG.  ?Do not shave (including legs and underarms) for at least 48 hours prior to first CHG shower. It is OK to shave your face. ? ?Please follow these instructions carefully. ?  ? ? Shower the NIGHT BEFORE SURGERY and the MORNING OF SURGERY with CHG Soap.  ? ?If you chose to wash your hair, wash your hair first as usual with your normal shampoo. After you shampoo, rinse your hair and body thoroughly to remove the shampoo.   ? ?Then Wash Face and genitals (private parts) with your normal soap and rinse thoroughly to remove soap. ? ?Next use the CHG Soap as you would any other liquid soap. You can apply CHG directly to the skin and wash gently with a clean washcloth.  ? ?Apply the CHG Soap to your body ONLY FROM THE NECK DOWN.  Do not use on open wounds or open sores. Avoid contact with your eyes, ears, mouth and genitals (private parts). Wash Face and genitals (private parts)  with your normal soap.  ? ?Wash thoroughly, paying special attention to the area where your surgery will be performed. ? ?Thoroughly rinse your body with warm water from the neck down. ? ?DO NOT shower/wash with your normal soap after using and rinsing off the CHG Soap. ? ?Pat yourself dry with a CLEAN TOWEL. ? ?Wear CLEAN PAJAMAS to bed the night before surgery ? ?Place CLEAN SHEETS on your bed the night before your surgery ? ?DO NOT SLEEP WITH PETS. ? ? ?Day of Surgery: ? ?Take a shower with CHG soap. ?Wear Clean/Comfortable clothing the morning of surgery ?Do not apply any deodorants/lotions.   ?Remember to brush your teeth WITH YOUR REGULAR TOOTHPASTE. ?  ?Please read over the fact sheets that you were given.  ? ?

## 2021-12-31 ENCOUNTER — Encounter (HOSPITAL_COMMUNITY)
Admission: RE | Admit: 2021-12-31 | Discharge: 2021-12-31 | Disposition: A | Discharge status: Home / Self Care | Payer: PPO | Source: Ambulatory Visit | Attending: Neurological Surgery | Admitting: Neurological Surgery

## 2021-12-31 ENCOUNTER — Other Ambulatory Visit: Payer: Self-pay

## 2021-12-31 ENCOUNTER — Encounter (HOSPITAL_COMMUNITY): Payer: Self-pay

## 2021-12-31 VITALS — BP 151/84 | HR 73 | Temp 98.0°F | Resp 17 | Ht 65.0 in | Wt 177.2 lb

## 2021-12-31 DIAGNOSIS — Z01812 Encounter for preprocedural laboratory examination: Secondary | ICD-10-CM | POA: Insufficient documentation

## 2021-12-31 DIAGNOSIS — I10 Essential (primary) hypertension: Secondary | ICD-10-CM | POA: Insufficient documentation

## 2021-12-31 DIAGNOSIS — F411 Generalized anxiety disorder: Secondary | ICD-10-CM

## 2021-12-31 DIAGNOSIS — Z20822 Contact with and (suspected) exposure to covid-19: Secondary | ICD-10-CM | POA: Insufficient documentation

## 2021-12-31 DIAGNOSIS — R531 Weakness: Secondary | ICD-10-CM | POA: Insufficient documentation

## 2021-12-31 DIAGNOSIS — Z01818 Encounter for other preprocedural examination: Secondary | ICD-10-CM

## 2021-12-31 LAB — TYPE AND SCREEN
ABO/RH(D): B POS
Antibody Screen: NEGATIVE

## 2021-12-31 LAB — BASIC METABOLIC PANEL
Anion gap: 7 (ref 5–15)
BUN: 15 mg/dL (ref 6–20)
CO2: 25 mmol/L (ref 22–32)
Calcium: 9.2 mg/dL (ref 8.9–10.3)
Chloride: 107 mmol/L (ref 98–111)
Creatinine, Ser: 1.09 mg/dL — ABNORMAL HIGH (ref 0.44–1.00)
GFR, Estimated: 58 mL/min — ABNORMAL LOW (ref >60)
Glucose, Bld: 88 mg/dL (ref 70–99)
Potassium: 4.1 mmol/L (ref 3.5–5.1)
Sodium: 139 mmol/L (ref 135–145)

## 2021-12-31 LAB — CBC
HCT: 39.6 % (ref 36.0–46.0)
Hemoglobin: 13.1 g/dL (ref 12.0–15.0)
MCH: 30 pg (ref 26.0–34.0)
MCHC: 33.1 g/dL (ref 30.0–36.0)
MCV: 90.6 fL (ref 80.0–100.0)
Platelets: 256 10*3/uL (ref 150–400)
RBC: 4.37 MIL/uL (ref 3.87–5.11)
RDW: 13.3 % (ref 11.5–15.5)
WBC: 5.1 10*3/uL (ref 4.0–10.5)
nRBC: 0 % (ref 0.0–0.2)

## 2021-12-31 LAB — SURGICAL PCR SCREEN

## 2021-12-31 LAB — PROTIME-INR
INR: 1.1 (ref 0.8–1.2)
Prothrombin Time: 14.1 seconds (ref 11.4–15.2)

## 2021-12-31 LAB — SARS CORONAVIRUS 2 (TAT 6-24 HRS): SARS Coronavirus 2: NEGATIVE

## 2021-12-31 NOTE — Progress Notes (Deleted)
Spoke to [place] with Dr. Stevenson Clinch office regarding patient's allergy to Betadine.  Was ordered for her surgery on the 10th.  ?

## 2021-12-31 NOTE — Progress Notes (Signed)
PCP - Dr. Betty [name]  ?Cardiologist - Dr. Debara Pickett with [place] ? ?Chest x-ray - 7/18/222 ?EKG - 10/30/21 ?Stress Test - 04/17/15 ?ECHO - 01/29/21 ?Cardiac Cath -04/18/15  ? ?Sleep Study - No ? ?DM - Denies ? ?COVID TEST- 12/31/21 ? ? ?Anesthesia review: Yes cardiac history ? ?Patient denies shortness of breath, fever, cough and chest pain at PAT appointment ? ? ?All instructions explained to the patient, with a verbal understanding of the material. Patient agrees to go over the instructions while at home for a better understanding. Patient also instructed to wear a mask while in public after being tested for COVID-19. The opportunity to ask questions was provided. ? ? ?

## 2022-01-01 NOTE — Progress Notes (Signed)
Anesthesia Chart Review: ? ?Patient has recently undergone extensive cardiology evaluation for chest pain and SOB/DOE.  Evaluation has been essentially benign.  Coronary CT 07/2021 showed no coronary artery disease, recent cardiac MRI showed no evidence for HOCM.  Last seen by Dr. Debara Pickett 10/30/2021.  Per note, "[name] had a reassuring cardiac MRI and I do not feel at this point that hypertrophic cardiomyopathy is an issue for her.  Its not likely that this is contributing to her shortness of breath nor is it coronary disease as her CT showed no evidence of coronary disease.  I suspect that she has deconditioning due to her multiple orthopedic issues and would benefit from continued exercise programs.  Blood pressure is better controlled."  Annual follow-up is recommended.  Cardiac clearance per telephone encounter 12/16/2021. Chart reviewed as part of pre-operative protocol coverage. Patient was contacted 12/16/2021 in reference to pre-operative risk assessment for pending surgery as outlined below.  [name] was last seen on 14/23 by Dr. Debara Pickett.  Since that day, [name] has done well. CT coronary 07/2021 showed no coronary artery disease. She struggles with neck and arm pain. She can complete 4.0 METS without angina (water aerobics for 45 min). Therefore, based on ACC/AHA guidelines, the patient would be at acceptable risk for the planned procedure without further cardiovascular testing." ? ?Preop labs reviewed, unremarkable. ? ?EKG 10/30/2021: NSR.  Rate 84. ? ?Cardiac MR 08/26/2021: ?IMPRESSION: ?1. Mild asymmetric basal septal hypertrophy with otherwise normal ?left ventricular wall thickness. Maximal wall thickness 10 mm in the ?basal septum. 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 ?  ?2.  Normal biventricular chamber size and function. ?  ?3. No post-contrast delayed myocardial enhancement  or myocardial ?edema. ECV 25% (normal range). No evidence of scar, fibrosis, ?inflammation, infarction or infiltrative process. ?  ?Finding in combination are not consistent with hypertrophic ?cardiomyopathy. ? ?Coronary CT 04/16/2021: ?IMPRESSION: ?1. No evidence of CAD, CADRADS = 0. ?  ?2. Coronary calcium score of 0. This was 0 percentile for age and ?sex matched control. ?  ?3. Normal coronary origin with right dominance. ?  ?4. Aortic atherosclerosis ?  ?5. Consider non-cardiac causes of chest pain ?  ?TTE 01/29/2021: ? 1. Left ventricular ejection fraction, by estimation, is 60 to 65%. Left  ?ventricular ejection fraction by 3D volume is 67 %. The left ventricle has  ?normal function. The left ventricle has no regional wall motion  ?abnormalities. Left ventricular diastolic  ? parameters are consistent with Grade I diastolic dysfunction (impaired  ?relaxation).  ? 2. Right ventricular systolic function is normal. The right ventricular  ?size is normal. There is normal pulmonary artery systolic pressure.  ? 3. The mitral valve is normal in structure. Mild mitral valve  ?regurgitation. No evidence of mitral stenosis.  ? 4. The aortic valve is normal in structure. Aortic valve regurgitation is  ?not visualized. No aortic stenosis is present.  ? 5. The inferior vena cava is normal in size with greater than 50%  ?respiratory variability, suggesting right atrial pressure of 3 mmHg.  ? ?Event monitor 12/22/2020: ?Summary: ?The patient's monitoring period was 12/22/2020 - 01/20/2021. Baseline sample showed Sinus Rhythm with a heart rate of 85.8 bpm. There were 0 critical, 0 ?serious, and 24 stable events that occurred > all sinus rhythm.  ? ? ? ?Cheryl Caldwell, PA-C ?[facility] Short Stay Center/Anesthesiology ?Phone [phone] ?01/01/2022 1:46 PM ? ?

## 2022-01-01 NOTE — Anesthesia Preprocedure Evaluation (Addendum)
Anesthesia Evaluation  ?Patient identified by MRN, date of birth, ID band ?Patient awake ? ? ? ?Reviewed: ?Allergy & Precautions, NPO status , Patient's Chart, lab work & pertinent test results ? ?History of Anesthesia Complications ?(+) PONV and history of anesthetic complications ? ?Airway ?Mallampati: III ? ?TM Distance: >3 FB ?Neck ROM: Limited ? ?Mouth opening: Limited Mouth Opening ? Dental ?no notable dental hx. ? ?  ?Pulmonary ?shortness of breath and with exertion,  ?  ?Pulmonary exam normal ? ? ? ? ? ? ? Cardiovascular ?hypertension, Pt. on medications and Pt. on home beta blockers ?+CHF  ? ?Rhythm:Regular Rate:Normal ? ? ?  ?Neuro/Psych ? Headaches, Anxiety Depression   ? GI/Hepatic ?Neg liver ROS, GERD  ,  ?Endo/Other  ?negative endocrine ROS ? Renal/GU ?negative Renal ROS  ?negative genitourinary ?  ?Musculoskeletal ? ?(+) Arthritis , Cervical stenosis   ? Abdominal ?Normal abdominal exam  (+)   ?Peds ? Hematology ? ?(+) Blood dyscrasia, anemia ,   ?Anesthesia Other Findings ? ? Reproductive/Obstetrics ? ?  ? ? ? ? ? ? ? ? ? ? ? ? ? ?  ?  ? ? ? ? ? ? ?Anesthesia Physical ?Anesthesia Plan ? ?ASA: 3 ? ?Anesthesia Plan: General  ? ?Post-op Pain Management: Tylenol PO (pre-op)* and Celebrex PO (pre-op)*  ? ?Induction: Intravenous ? ?PONV Risk Score and Plan: 4 or greater and Ondansetron, Dexamethasone, Midazolam and Treatment may vary due to age or medical condition ? ?Airway Management Planned: Mask and Oral ETT ? ?Additional Equipment: None ? ?Intra-op Plan:  ? ?Post-operative Plan: Extubation in OR ? ?Informed Consent: I have reviewed the patients History and Physical, chart, labs and discussed the procedure including the risks, benefits and alternatives for the proposed anesthesia with the patient or authorized representative who has indicated his/her understanding and acceptance.  ? ? ? ?Dental advisory given ? ?Plan Discussed with: CRNA ? ?Anesthesia Plan Comments:  (Lab Results ?     Component                Value               Date                 ?     WBC                      5.1                 12/31/2021           ?     HGB                      13.1                12/31/2021           ?     HCT                      39.6                12/31/2021           ?     MCV                      90.6                12/31/2021           ?  PLT                      256                 12/31/2021           ?Lab Results ?     Component                Value               Date                 ?     NA                       139                 12/31/2021           ?     K                        4.1                 12/31/2021           ?     CO2                      25                  12/31/2021           ?     GLUCOSE                  88                  12/31/2021           ?     BUN                      15                  12/31/2021           ?     CREATININE               1.09 (H)            12/31/2021           ?     CALCIUM                  9.2                 12/31/2021           ?     EGFR                     56 (L)              08/09/2021           ?     GFRNONAA                 58 (L)              12/31/2021           ?PAT note by Karoline Caldwell, PA-C: ? ?Patient has recently undergone extensive cardiology evaluation for chest pain and SOB/DOE.  Evaluation has been  essentially benign.  Coronary CT 07/2021 showed no coronary artery disease, recent cardiac MRI showed no evidence for HOCM.  Last seen by Dr. Debara Pickett 10/30/2021.  Per note, "Ms. Dalton-Manternach had a reassuring cardiac MRI and I do not feel at this point that hypertrophic cardiomyopathy is an issue for her. ?Its not likely that this is contributing to her shortness of breath nor is it coronary disease as her CT showed no evidence of coronary disease. ?I suspect that she has deconditioning due to her multiple orthopedic issues and would benefit from continued exercise programs. ?Blood pressure is better controlled."   Annual follow-up is recommended.  Cardiac clearance per telephone encounter 12/16/2021. Chart reviewed as part of pre-operative protocol coverage. Patient was contacted?12/16/2021?in reference to pre-operative risk assessment for pending surgery as outlined below. ?Adela Lank Dalton Hopes?was last seen on 14/23?by Dr. Debara Pickett. ?Since that day, Salena Saner Madrigal?has done well.?CT coronary 07/2021 showed no coronary artery disease. She struggles with neck and arm pain. She can complete 4.0 METS without angina (water aerobics for 45 min).?Therefore, based on ACC/AHA guidelines, the patient would be at acceptable risk for the planned procedure without further cardiovascular testing." ? ?Preop labs reviewed, unremarkable. ? ?EKG 10/30/2021: NSR.  Rate 84. ? ?Cardiac MR 08/26/2021: ?IMPRESSION: ?1. Mild asymmetric basal septal hypertrophy with otherwise normal ?left ventricular wall thickness. Maximal wall thickness 10 mm in the ?basal septum. 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 ?? ?2. ?Normal biventricular chamber size and function. ?? ?3. No post-contrast delayed myocardial enhancement or myocardial ?edema. ECV 25% (normal range). No evidence of scar, fibrosis, ?inflammation, infarction or infiltrative process. ?? ?Finding in combination are not consistent with hypertrophic ?cardiomyopathy. ? ?Coronary CT 04/16/2021: ?IMPRESSION: ?1. No evidence of CAD, CADRADS = 0. ?? ?2. Coronary calcium score of 0. This was 0 percentile for age and ?sex matched control. ?? ?3. Normal coronary origin with right dominance. ?? ?4. Aortic atherosclerosis ?? ?5. Consider non-cardiac causes of chest pain ?? ?TTE 01/29/2021: ??1. Left ventricular ejection fraction, by estimation, is 60 to 65%. Left  ?ventricular ejection fraction by 3D volume is 67 %. The left ventricle has  ?normal function. The left ventricle has no regional wall motion  ?abnormalities.  Left ventricular diastolic  ??parameters are consistent with Grade I diastolic dysfunction (impaired  ?relaxation).  ??2. Right ventricular systolic function is normal. The right ventricular  ?size is normal. There is normal pulmonary artery systolic pressure.  ??3. The mitral valve is normal in structure. Mild mitral valve  ?regurgitation. No evidence of mitral stenosis.  ??4. The aortic valve is normal in structure. Aortic valve regurgitation is  ?not visualized. No aortic stenosis is present.  ??5. The inferior vena cava is normal in size with greater than 50%  ?respiratory variability, suggesting right atrial pressure of 3 mmHg.  ? ?Event monitor 12/22/2020: ?Summary: ?The patient's monitoring period was 12/22/2020 - 01/20/2021. Baseline sample showed Sinus Rhythm with a heart rate of 85.8 bpm. There were 0 critical, 0 ?serious, and 24 stable events that occurred > all sinus rhythm.  ? ?)  ? ? ? ? ?Anesthesia Quick Evaluation ? ?

## 2022-01-03 ENCOUNTER — Inpatient Hospital Stay (HOSPITAL_COMMUNITY)
Admission: AD | Admit: 2022-01-03 | Discharge: 2022-01-07 | DRG: 473 | Disposition: A | Discharge status: Home Health | Payer: PPO | Attending: Neurosurgery | Admitting: Neurosurgery

## 2022-01-03 ENCOUNTER — Other Ambulatory Visit: Payer: Self-pay

## 2022-01-03 ENCOUNTER — Encounter (HOSPITAL_COMMUNITY)
Admission: AD | Disposition: A | Discharge status: Home Health | Payer: Self-pay | Source: Home / Self Care | Attending: Neurological Surgery

## 2022-01-03 ENCOUNTER — Ambulatory Visit (HOSPITAL_COMMUNITY): Payer: PPO | Admitting: Anesthesiology

## 2022-01-03 ENCOUNTER — Ambulatory Visit (HOSPITAL_COMMUNITY): Payer: PPO | Admitting: Physician Assistant

## 2022-01-03 ENCOUNTER — Ambulatory Visit (HOSPITAL_COMMUNITY): Payer: PPO

## 2022-01-03 ENCOUNTER — Encounter (HOSPITAL_COMMUNITY): Payer: Self-pay | Admitting: Neurological Surgery

## 2022-01-03 DIAGNOSIS — M50121 Cervical disc disorder at C4-C5 level with radiculopathy: Secondary | ICD-10-CM | POA: Diagnosis present

## 2022-01-03 DIAGNOSIS — Z79899 Other long term (current) drug therapy: Secondary | ICD-10-CM

## 2022-01-03 DIAGNOSIS — Z419 Encounter for procedure for purposes other than remedying health state, unspecified: Secondary | ICD-10-CM | POA: Diagnosis not present

## 2022-01-03 DIAGNOSIS — I509 Heart failure, unspecified: Secondary | ICD-10-CM | POA: Diagnosis not present

## 2022-01-03 DIAGNOSIS — E785 Hyperlipidemia, unspecified: Secondary | ICD-10-CM | POA: Diagnosis present

## 2022-01-03 DIAGNOSIS — R1310 Dysphagia, unspecified: Secondary | ICD-10-CM | POA: Diagnosis not present

## 2022-01-03 DIAGNOSIS — Z9109 Other allergy status, other than to drugs and biological substances: Secondary | ICD-10-CM | POA: Diagnosis not present

## 2022-01-03 DIAGNOSIS — M4722 Other spondylosis with radiculopathy, cervical region: Secondary | ICD-10-CM | POA: Diagnosis present

## 2022-01-03 DIAGNOSIS — Z880 Allergy status to penicillin: Secondary | ICD-10-CM

## 2022-01-03 DIAGNOSIS — M50122 Cervical disc disorder at C5-C6 level with radiculopathy: Secondary | ICD-10-CM | POA: Diagnosis present

## 2022-01-03 DIAGNOSIS — I110 Hypertensive heart disease with heart failure: Secondary | ICD-10-CM

## 2022-01-03 DIAGNOSIS — Z9851 Tubal ligation status: Secondary | ICD-10-CM | POA: Diagnosis not present

## 2022-01-03 DIAGNOSIS — I700 Atherosclerosis of aorta: Secondary | ICD-10-CM | POA: Diagnosis present

## 2022-01-03 DIAGNOSIS — Z808 Family history of malignant neoplasm of other organs or systems: Secondary | ICD-10-CM | POA: Diagnosis not present

## 2022-01-03 DIAGNOSIS — S14109A Unspecified injury at unspecified level of cervical spinal cord, initial encounter: Secondary | ICD-10-CM

## 2022-01-03 DIAGNOSIS — Z807 Family history of other malignant neoplasms of lymphoid, hematopoietic and related tissues: Secondary | ICD-10-CM | POA: Diagnosis not present

## 2022-01-03 DIAGNOSIS — Z888 Allergy status to other drugs, medicaments and biological substances status: Secondary | ICD-10-CM | POA: Diagnosis not present

## 2022-01-03 DIAGNOSIS — Z801 Family history of malignant neoplasm of trachea, bronchus and lung: Secondary | ICD-10-CM

## 2022-01-03 DIAGNOSIS — K219 Gastro-esophageal reflux disease without esophagitis: Secondary | ICD-10-CM | POA: Diagnosis present

## 2022-01-03 DIAGNOSIS — Z803 Family history of malignant neoplasm of breast: Secondary | ICD-10-CM

## 2022-01-03 DIAGNOSIS — F419 Anxiety disorder, unspecified: Secondary | ICD-10-CM | POA: Diagnosis present

## 2022-01-03 DIAGNOSIS — I10 Essential (primary) hypertension: Secondary | ICD-10-CM | POA: Diagnosis present

## 2022-01-03 DIAGNOSIS — Z20822 Contact with and (suspected) exposure to covid-19: Secondary | ICD-10-CM | POA: Diagnosis present

## 2022-01-03 DIAGNOSIS — Z8249 Family history of ischemic heart disease and other diseases of the circulatory system: Secondary | ICD-10-CM | POA: Diagnosis not present

## 2022-01-03 DIAGNOSIS — D649 Anemia, unspecified: Secondary | ICD-10-CM | POA: Diagnosis not present

## 2022-01-03 DIAGNOSIS — Z981 Arthrodesis status: Principal | ICD-10-CM

## 2022-01-03 DIAGNOSIS — T8189XA Other complications of procedures, not elsewhere classified, initial encounter: Secondary | ICD-10-CM | POA: Diagnosis not present

## 2022-01-03 DIAGNOSIS — F418 Other specified anxiety disorders: Secondary | ICD-10-CM | POA: Diagnosis not present

## 2022-01-03 DIAGNOSIS — Z91040 Latex allergy status: Secondary | ICD-10-CM

## 2022-01-03 DIAGNOSIS — R519 Headache, unspecified: Secondary | ICD-10-CM | POA: Diagnosis present

## 2022-01-03 DIAGNOSIS — M4802 Spinal stenosis, cervical region: Secondary | ICD-10-CM | POA: Diagnosis present

## 2022-01-03 DIAGNOSIS — G8918 Other acute postprocedural pain: Secondary | ICD-10-CM | POA: Diagnosis not present

## 2022-01-03 DIAGNOSIS — Z800 Family history of malignant neoplasm of digestive organs: Secondary | ICD-10-CM | POA: Diagnosis not present

## 2022-01-03 DIAGNOSIS — Z01818 Encounter for other preprocedural examination: Secondary | ICD-10-CM

## 2022-01-03 DIAGNOSIS — G894 Chronic pain syndrome: Secondary | ICD-10-CM | POA: Diagnosis present

## 2022-01-03 HISTORY — PX: ANTERIOR CERVICAL DECOMP/DISCECTOMY FUSION: SHX1161

## 2022-01-03 LAB — SURGICAL PCR SCREEN
MRSA, PCR: NEGATIVE
Staphylococcus aureus: NEGATIVE

## 2022-01-03 SURGERY — ANTERIOR CERVICAL DECOMPRESSION/DISCECTOMY FUSION 2 LEVELS
Anesthesia: General | Site: Spine Cervical

## 2022-01-03 MED ORDER — METHOCARBAMOL 1000 MG/10ML IJ SOLN
500.0000 mg | Freq: Four times a day (QID) | INTRAVENOUS | Status: DC | PRN
  Filled 2022-01-03: qty 5

## 2022-01-03 MED ORDER — MIDAZOLAM HCL 2 MG/2ML IJ SOLN
INTRAMUSCULAR | Status: AC
  Filled 2022-01-03: qty 2

## 2022-01-03 MED ORDER — PROPOFOL 10 MG/ML IV BOLUS
INTRAVENOUS | Status: DC | PRN
  Administered 2022-01-03: 160 mg via INTRAVENOUS

## 2022-01-03 MED ORDER — POTASSIUM CHLORIDE IN NACL 20-0.9 MEQ/L-% IV SOLN
INTRAVENOUS | Status: DC
  Filled 2022-01-03 (×2): qty 1000

## 2022-01-03 MED ORDER — TRAMADOL HCL 50 MG PO TABS
50.0000 mg | ORAL_TABLET | Freq: Four times a day (QID) | ORAL | Status: DC | PRN
  Administered 2022-01-04 – 2022-01-06 (×6): 50 mg via ORAL
  Filled 2022-01-03 (×6): qty 1

## 2022-01-03 MED ORDER — THROMBIN 5000 UNITS EX SOLR
CUTANEOUS | Status: AC
  Filled 2022-01-03: qty 15000

## 2022-01-03 MED ORDER — MUPIROCIN 2 % EX OINT
1.0000 "application " | TOPICAL_OINTMENT | Freq: Once | CUTANEOUS | Status: AC
  Administered 2022-01-03: 1 via TOPICAL

## 2022-01-03 MED ORDER — METHOCARBAMOL 500 MG PO TABS
500.0000 mg | ORAL_TABLET | Freq: Four times a day (QID) | ORAL | Status: DC | PRN
  Administered 2022-01-03 – 2022-01-07 (×7): 500 mg via ORAL
  Filled 2022-01-03 (×7): qty 1

## 2022-01-03 MED ORDER — MUPIROCIN 2 % EX OINT
TOPICAL_OINTMENT | CUTANEOUS | Status: AC
  Filled 2022-01-03: qty 22

## 2022-01-03 MED ORDER — SODIUM CHLORIDE 0.9% FLUSH
3.0000 mL | Freq: Two times a day (BID) | INTRAVENOUS | Status: DC
  Administered 2022-01-03 – 2022-01-07 (×8): 3 mL via INTRAVENOUS

## 2022-01-03 MED ORDER — KETAMINE HCL 10 MG/ML IJ SOLN
INTRAMUSCULAR | Status: DC | PRN
  Administered 2022-01-03: 10 mg via INTRAVENOUS
  Administered 2022-01-03: 20 mg via INTRAVENOUS

## 2022-01-03 MED ORDER — GABAPENTIN 300 MG PO CAPS: 300.0000 mg | ORAL_CAPSULE | ORAL | Status: DC

## 2022-01-03 MED ORDER — CELECOXIB 200 MG PO CAPS
200.0000 mg | ORAL_CAPSULE | Freq: Once | ORAL | Status: AC
  Administered 2022-01-03: 200 mg via ORAL
  Filled 2022-01-03: qty 1

## 2022-01-03 MED ORDER — SODIUM CHLORIDE 0.9% FLUSH: 3.0000 mL | INTRAVENOUS | Status: DC | PRN

## 2022-01-03 MED ORDER — HEMOSTATIC AGENTS (NO CHARGE) OPTIME
TOPICAL | Status: DC | PRN
  Administered 2022-01-03: 1 via TOPICAL

## 2022-01-03 MED ORDER — OXYCODONE HCL 5 MG PO TABS
5.0000 mg | ORAL_TABLET | ORAL | Status: DC | PRN
  Administered 2022-01-03: 5 mg via ORAL
  Administered 2022-01-06 – 2022-01-07 (×3): 10 mg via ORAL
  Filled 2022-01-03 (×4): qty 2
  Filled 2022-01-03: qty 1
  Filled 2022-01-03: qty 2

## 2022-01-03 MED ORDER — DEXAMETHASONE 4 MG PO TABS
4.0000 mg | ORAL_TABLET | Freq: Four times a day (QID) | ORAL | Status: DC
  Administered 2022-01-03 – 2022-01-04 (×6): 4 mg via ORAL
  Filled 2022-01-03 (×7): qty 1

## 2022-01-03 MED ORDER — THROMBIN 5000 UNITS EX SOLR
OROMUCOSAL | Status: DC | PRN
  Administered 2022-01-03: 5 mL via TOPICAL

## 2022-01-03 MED ORDER — THROMBIN 5000 UNITS EX SOLR
CUTANEOUS | Status: DC | PRN
  Administered 2022-01-03 (×2): 5000 [IU] via TOPICAL

## 2022-01-03 MED ORDER — PHENOL 1.4 % MT LIQD: 1.0000 | OROMUCOSAL | Status: DC | PRN

## 2022-01-03 MED ORDER — BUPIVACAINE HCL (PF) 0.25 % IJ SOLN
INTRAMUSCULAR | Status: AC
  Filled 2022-01-03: qty 30

## 2022-01-03 MED ORDER — ACETAMINOPHEN 500 MG PO TABS
1000.0000 mg | ORAL_TABLET | Freq: Once | ORAL | Status: AC
  Administered 2022-01-03: 1000 mg via ORAL
  Filled 2022-01-03: qty 2

## 2022-01-03 MED ORDER — OXYCODONE HCL 5 MG PO TABS: 5.0000 mg | ORAL_TABLET | Freq: Once | ORAL | Status: DC | PRN

## 2022-01-03 MED ORDER — CHLORHEXIDINE GLUCONATE CLOTH 2 % EX PADS: 6.0000 | MEDICATED_PAD | Freq: Once | CUTANEOUS | Status: DC

## 2022-01-03 MED ORDER — ONDANSETRON HCL 4 MG PO TABS
4.0000 mg | ORAL_TABLET | Freq: Four times a day (QID) | ORAL | Status: DC | PRN
  Administered 2022-01-03: 4 mg via ORAL
  Filled 2022-01-03: qty 1

## 2022-01-03 MED ORDER — ONDANSETRON HCL 4 MG/2ML IJ SOLN: 4.0000 mg | Freq: Once | INTRAMUSCULAR | Status: AC

## 2022-01-03 MED ORDER — IRBESARTAN 75 MG PO TABS
75.0000 mg | ORAL_TABLET | Freq: Every day | ORAL | Status: DC
Start: 2022-01-04 — End: 2022-01-07
  Administered 2022-01-04 – 2022-01-07 (×4): 75 mg via ORAL
  Filled 2022-01-03 (×4): qty 1

## 2022-01-03 MED ORDER — ONDANSETRON HCL 4 MG/2ML IJ SOLN
INTRAMUSCULAR | Status: AC
  Administered 2022-01-03: 4 mg via INTRAVENOUS
  Filled 2022-01-03: qty 2

## 2022-01-03 MED ORDER — VANCOMYCIN HCL IN DEXTROSE 1-5 GM/200ML-% IV SOLN
1000.0000 mg | Freq: Once | INTRAVENOUS | Status: AC
  Administered 2022-01-03: 1000 mg via INTRAVENOUS
  Filled 2022-01-03: qty 200

## 2022-01-03 MED ORDER — HYDRALAZINE HCL 20 MG/ML IJ SOLN
10.0000 mg | Freq: Once | INTRAMUSCULAR | Status: AC
  Administered 2022-01-03: 10 mg via INTRAVENOUS

## 2022-01-03 MED ORDER — MIDAZOLAM HCL 2 MG/2ML IJ SOLN
1.0000 mg | Freq: Once | INTRAMUSCULAR | Status: AC
  Administered 2022-01-03: 1 mg via INTRAVENOUS

## 2022-01-03 MED ORDER — PHENYLEPHRINE HCL-NACL 20-0.9 MG/250ML-% IV SOLN
INTRAVENOUS | Status: DC | PRN
  Administered 2022-01-03: 50 ug/min via INTRAVENOUS

## 2022-01-03 MED ORDER — SODIUM CHLORIDE 0.9 % IV SOLN
250.0000 mL | INTRAVENOUS | Status: DC
  Administered 2022-01-03: 13:00:00 250 mL via INTRAVENOUS

## 2022-01-03 MED ORDER — CARVEDILOL 25 MG PO TABS
25.0000 mg | ORAL_TABLET | Freq: Two times a day (BID) | ORAL | Status: DC
Start: 2022-01-03 — End: 2022-01-07
  Administered 2022-01-03 – 2022-01-07 (×8): 25 mg via ORAL
  Filled 2022-01-03 (×8): qty 1

## 2022-01-03 MED ORDER — ONDANSETRON HCL 4 MG/2ML IJ SOLN
4.0000 mg | Freq: Four times a day (QID) | INTRAMUSCULAR | Status: DC | PRN
  Administered 2022-01-03: 4 mg via INTRAVENOUS
  Filled 2022-01-03: qty 2

## 2022-01-03 MED ORDER — SENNA 8.6 MG PO TABS
1.0000 | ORAL_TABLET | Freq: Two times a day (BID) | ORAL | Status: DC
  Administered 2022-01-03 – 2022-01-07 (×8): 8.6 mg via ORAL
  Filled 2022-01-03 (×8): qty 1

## 2022-01-03 MED ORDER — OXYCODONE HCL 5 MG/5ML PO SOLN: 5.0000 mg | Freq: Once | ORAL | Status: DC | PRN

## 2022-01-03 MED ORDER — MORPHINE SULFATE (PF) 2 MG/ML IV SOLN
2.0000 mg | INTRAVENOUS | Status: DC | PRN
  Administered 2022-01-03: 2 mg via INTRAVENOUS
  Filled 2022-01-03: qty 1

## 2022-01-03 MED ORDER — PHENYLEPHRINE 40 MCG/ML (10ML) SYRINGE FOR IV PUSH (FOR BLOOD PRESSURE SUPPORT)
PREFILLED_SYRINGE | INTRAVENOUS | Status: AC
  Filled 2022-01-03: qty 10

## 2022-01-03 MED ORDER — OXYCODONE HCL 5 MG PO TABS
10.0000 mg | ORAL_TABLET | ORAL | Status: DC | PRN
  Administered 2022-01-03: 10 mg via ORAL
  Filled 2022-01-03: qty 2

## 2022-01-03 MED ORDER — BUSPIRONE HCL 5 MG PO TABS
5.0000 mg | ORAL_TABLET | Freq: Two times a day (BID) | ORAL | Status: DC | PRN
  Administered 2022-01-04 – 2022-01-06 (×2): 5 mg via ORAL
  Filled 2022-01-03 (×3): qty 1

## 2022-01-03 MED ORDER — SUGAMMADEX SODIUM 200 MG/2ML IV SOLN
INTRAVENOUS | Status: DC | PRN
  Administered 2022-01-03: 200 mg via INTRAVENOUS

## 2022-01-03 MED ORDER — DEXAMETHASONE SODIUM PHOSPHATE 10 MG/ML IJ SOLN
INTRAMUSCULAR | Status: DC | PRN
  Administered 2022-01-03: 10 mg via INTRAVENOUS

## 2022-01-03 MED ORDER — VANCOMYCIN HCL IN DEXTROSE 1-5 GM/200ML-% IV SOLN
1000.0000 mg | INTRAVENOUS | Status: AC
  Administered 2022-01-03: 1000 mg via INTRAVENOUS
  Filled 2022-01-03: qty 200

## 2022-01-03 MED ORDER — FENTANYL CITRATE (PF) 250 MCG/5ML IJ SOLN
INTRAMUSCULAR | Status: AC
  Filled 2022-01-03: qty 5

## 2022-01-03 MED ORDER — ORAL CARE MOUTH RINSE: 15.0000 mL | Freq: Once | OROMUCOSAL | Status: AC

## 2022-01-03 MED ORDER — PROPOFOL 10 MG/ML IV BOLUS
INTRAVENOUS | Status: AC
  Filled 2022-01-03: qty 20

## 2022-01-03 MED ORDER — ACETAMINOPHEN 500 MG PO TABS
1000.0000 mg | ORAL_TABLET | Freq: Four times a day (QID) | ORAL | Status: AC
  Administered 2022-01-03 – 2022-01-04 (×3): 1000 mg via ORAL
  Filled 2022-01-03 (×3): qty 2

## 2022-01-03 MED ORDER — MIDAZOLAM HCL 2 MG/2ML IJ SOLN
INTRAMUSCULAR | Status: DC | PRN
  Administered 2022-01-03: 2 mg via INTRAVENOUS

## 2022-01-03 MED ORDER — 0.9 % SODIUM CHLORIDE (POUR BTL) OPTIME
TOPICAL | Status: DC | PRN
  Administered 2022-01-03: 1000 mL

## 2022-01-03 MED ORDER — LACTATED RINGERS IV SOLN: INTRAVENOUS | Status: DC

## 2022-01-03 MED ORDER — FENTANYL CITRATE (PF) 100 MCG/2ML IJ SOLN
25.0000 ug | INTRAMUSCULAR | Status: DC | PRN
  Administered 2022-01-03 (×3): 25 ug via INTRAVENOUS

## 2022-01-03 MED ORDER — KETAMINE HCL 50 MG/5ML IJ SOSY
PREFILLED_SYRINGE | INTRAMUSCULAR | Status: AC
  Filled 2022-01-03: qty 5

## 2022-01-03 MED ORDER — CHLORHEXIDINE GLUCONATE 0.12 % MT SOLN
15.0000 mL | Freq: Once | OROMUCOSAL | Status: AC
  Administered 2022-01-03: 15 mL via OROMUCOSAL
  Filled 2022-01-03: qty 15

## 2022-01-03 MED ORDER — FENTANYL CITRATE (PF) 100 MCG/2ML IJ SOLN
INTRAMUSCULAR | Status: AC
  Filled 2022-01-03: qty 2

## 2022-01-03 MED ORDER — AMISULPRIDE (ANTIEMETIC) 5 MG/2ML IV SOLN: 10.0000 mg | Freq: Once | INTRAVENOUS | Status: DC | PRN

## 2022-01-03 MED ORDER — ROCURONIUM BROMIDE 10 MG/ML (PF) SYRINGE
PREFILLED_SYRINGE | INTRAVENOUS | Status: DC | PRN
  Administered 2022-01-03: 30 mg via INTRAVENOUS
  Administered 2022-01-03: 70 mg via INTRAVENOUS
  Administered 2022-01-03: 10 mg via INTRAVENOUS

## 2022-01-03 MED ORDER — GABAPENTIN 400 MG PO CAPS
400.0000 mg | ORAL_CAPSULE | Freq: Three times a day (TID) | ORAL | Status: DC
Start: 2022-01-03 — End: 2022-01-07
  Administered 2022-01-03 – 2022-01-07 (×13): 400 mg via ORAL
  Filled 2022-01-03 (×13): qty 1

## 2022-01-03 MED ORDER — HYDRALAZINE HCL 20 MG/ML IJ SOLN
INTRAMUSCULAR | Status: AC
  Filled 2022-01-03: qty 1

## 2022-01-03 MED ORDER — FENTANYL CITRATE (PF) 250 MCG/5ML IJ SOLN
INTRAMUSCULAR | Status: DC | PRN
  Administered 2022-01-03 (×4): 50 ug via INTRAVENOUS

## 2022-01-03 MED ORDER — MENTHOL 3 MG MT LOZG
1.0000 | LOZENGE | OROMUCOSAL | Status: DC | PRN
  Administered 2022-01-04 – 2022-01-05 (×2): 3 mg via ORAL
  Filled 2022-01-03: qty 9

## 2022-01-03 MED ORDER — DEXAMETHASONE SODIUM PHOSPHATE 4 MG/ML IJ SOLN
4.0000 mg | Freq: Four times a day (QID) | INTRAMUSCULAR | Status: DC
  Administered 2022-01-03 – 2022-01-05 (×2): 4 mg via INTRAVENOUS
  Filled 2022-01-03 (×2): qty 1

## 2022-01-03 SURGICAL SUPPLY — 59 items
ADH SKN CLS APL DERMABOND .7 (GAUZE/BANDAGES/DRESSINGS) ×1
APL SKNCLS STERI-STRIP NONHPOA (GAUZE/BANDAGES/DRESSINGS)
BAG COUNTER SPONGE SURGICOUNT (BAG) ×3 IMPLANT
BAG SPNG CNTER NS LX DISP (BAG) ×1
BAND INSRT 18 STRL LF DISP RB (MISCELLANEOUS) ×2
BAND RUBBER #18 3X1/16 STRL (MISCELLANEOUS) ×6 IMPLANT
BASKET BONE COLLECTION (BASKET) ×1 IMPLANT
BENZOIN TINCTURE PRP APPL 2/3 (GAUZE/BANDAGES/DRESSINGS) ×2 IMPLANT
BIT DRILL 2.3X12 (BIT) ×1 IMPLANT
BUR CARBIDE MATCH 3.0 (BURR) ×3 IMPLANT
CANISTER SUCT 3000ML PPV (MISCELLANEOUS) ×3 IMPLANT
DERMABOND ADVANCED (GAUZE/BANDAGES/DRESSINGS) ×1
DERMABOND ADVANCED .7 DNX12 (GAUZE/BANDAGES/DRESSINGS) IMPLANT
DRAPE C-ARM 42X72 X-RAY (DRAPES) ×6 IMPLANT
DRAPE LAPAROTOMY 100X72 PEDS (DRAPES) ×3 IMPLANT
DRAPE MICROSCOPE LEICA (MISCELLANEOUS) ×3 IMPLANT
DRSG OPSITE POSTOP 4X6 (GAUZE/BANDAGES/DRESSINGS) ×1 IMPLANT
DURAPREP 6ML APPLICATOR 50/CS (WOUND CARE) ×2 IMPLANT
ELECT COATED BLADE 2.86 ST (ELECTRODE) ×3 IMPLANT
ELECT REM PT RETURN 9FT ADLT (ELECTROSURGICAL) ×2
ELECTRODE REM PT RTRN 9FT ADLT (ELECTROSURGICAL) ×2 IMPLANT
GAUZE 4X4 16PLY ~~LOC~~+RFID DBL (SPONGE) ×1 IMPLANT
GLOVE SURG ENC MOIS LTX SZ7 (GLOVE) IMPLANT
GLOVE SURG ENC MOIS LTX SZ8 (GLOVE) ×2 IMPLANT
GLOVE SURG POLYISO LF SZ7 (GLOVE) ×5 IMPLANT
GLOVE SURG POLYISO LF SZ8 (GLOVE) ×2 IMPLANT
GLOVE SURG UNDER POLY LF SZ7 (GLOVE) ×3 IMPLANT
GOWN STRL REUS W/ TWL LRG LVL3 (GOWN DISPOSABLE) IMPLANT
GOWN STRL REUS W/ TWL XL LVL3 (GOWN DISPOSABLE) ×2 IMPLANT
GOWN STRL REUS W/TWL 2XL LVL3 (GOWN DISPOSABLE) IMPLANT
GOWN STRL REUS W/TWL LRG LVL3 (GOWN DISPOSABLE) ×6
GOWN STRL REUS W/TWL XL LVL3 (GOWN DISPOSABLE) ×2
HEMOSTAT POWDER KIT SURGIFOAM (HEMOSTASIS) ×3 IMPLANT
KIT BASIN OR (CUSTOM PROCEDURE TRAY) ×3 IMPLANT
KIT TURNOVER KIT B (KITS) ×3 IMPLANT
NDL HYPO 25X1 1.5 SAFETY (NEEDLE) ×2 IMPLANT
NDL SPNL 20GX3.5 QUINCKE YW (NEEDLE) ×2 IMPLANT
NEEDLE HYPO 25X1 1.5 SAFETY (NEEDLE) ×2 IMPLANT
NEEDLE SPNL 20GX3.5 QUINCKE YW (NEEDLE) ×2 IMPLANT
NS IRRIG 1000ML POUR BTL (IV SOLUTION) ×3 IMPLANT
PACK LAMINECTOMY NEURO (CUSTOM PROCEDURE TRAY) ×3 IMPLANT
PAD ARMBOARD 7.5X6 YLW CONV (MISCELLANEOUS) ×9 IMPLANT
PIN DISTRACTION 14MM (PIN) ×6 IMPLANT
PLATE ACP 42X2 LVL NS LF SPN (Plate) IMPLANT
PLATE ACP INSIGNIA 42 2L (Plate) ×2 IMPLANT
PUTTY BONE 1CC (Putty) ×1 IMPLANT
SCREW VA SINGLE LEAD 4X14 (Screw) ×12 IMPLANT
SCREW VA SINGLE LEAD 4X14 ST (Screw) IMPLANT
SPACER IDENTITI 7X16X14 7D (Spacer) ×1 IMPLANT
SPACER IDENTITI 8X16X14 7D (Spacer) ×1 IMPLANT
SPONGE INTESTINAL PEANUT (DISPOSABLE) ×3 IMPLANT
SPONGE SURGIFOAM ABS GEL SZ50 (HEMOSTASIS) ×3 IMPLANT
SPONGE T-LAP 4X18 ~~LOC~~+RFID (SPONGE) ×2 IMPLANT
STRIP CLOSURE SKIN 1/2X4 (GAUZE/BANDAGES/DRESSINGS) ×3 IMPLANT
SUT VIC AB 3-0 SH 8-18 (SUTURE) ×6 IMPLANT
SUT VICRYL 4-0 PS2 18IN ABS (SUTURE) IMPLANT
TOWEL GREEN STERILE (TOWEL DISPOSABLE) ×3 IMPLANT
TOWEL GREEN STERILE FF (TOWEL DISPOSABLE) ×3 IMPLANT
WATER STERILE IRR 1000ML POUR (IV SOLUTION) ×3 IMPLANT

## 2022-01-03 NOTE — Op Note (Signed)
01/03/2022 ? ?9:52 AM ? ?PATIENT:  [name]  61 y.o. female ? ?PRE-OPERATIVE DIAGNOSIS: Cervical spondylosis with cervical disc herniation C4-5, cervical spondylosis with cervical disc herniation and severe left foraminal stenosis C5-6, left radiculopathy ? ?POST-OPERATIVE DIAGNOSIS:  same ? ?PROCEDURE:  1. Decompressive anterior cervical discectomy C4-5 C5-6, 2. Anterior cervical arthrodesis C4-5 C5-6 utilizing a PTI interbody cage packed with locally harvested morcellized autologous bone graft and DBM, 3. Anterior cervical plating C4-5 C5-6 utilizing a Alphatec plate ? ?SURGEON:  Sherley Bounds, MD ? ?ASSISTANTS: Glenford Peers FNP ? ?ANESTHESIA:   General ? ?EBL: Less than 25 ml ? ?Total I/O ?In: 700 [I.V.:700] ?Out: -  ? ?BLOOD ADMINISTERED: none ? ?DRAINS: none ? ?SPECIMEN:  none ? ?INDICATION FOR PROCEDURE: This patient presented with severe left arm pain. Imaging showed cervical spondylosis at C4-5 and C5-6 with severe left neuroforaminal stenosis at C5-6 compressing the left C6 nerve root. The patient tried conservative measures without relief. Pain was debilitating. Recommended ACDF with plating. Patient understood the risks, benefits, and alternatives and potential outcomes and wished to proceed. ? ?PROCEDURE DETAILS: ?Patient was brought to the operating room placed under general endotracheal anesthesia. Patient was placed in the supine position on the operating room table. The neck was prepped with Duraprep and draped in a sterile fashion.   Three cc of local anesthesia was injected and a transverse incision was made on the right side of the neck.  Dissection was carried down thru the subcutaneous tissue and the platysma was  elevated, opened, and undermined with Metzenbaum scissors.  Dissection was then carried out thru an avascular plane leaving the sternocleidomastoid carotid artery and jugular vein laterally and the trachea and esophagus medially with the assistance of my nurse  practitioner. The ventral aspect of the vertebral column was identified and a localizing x-ray was taken. The C4-5 and C5 level was identified and all in the room agreed with the level. The longus colli muscles were then elevated and the retractor was placed with the assistance of my nurse practitioner. The annulus was incised and the disc space entered. Discectomy was performed with micro-curettes and pituitary rongeurs. I then used the high-speed drill to drill the endplates down to the level of the posterior longitudinal ligament. The drill shavings were saved in a mucous trap for later arthrodesis. The operating microscope was draped and brought into the field provided additional magnification, illumination and visualization. Discectomy was continued posteriorly thru the disc space. Posterior longitudinal ligament was opened with a nerve hook, and then removed along with disc herniation and osteophytes, decompressing the spinal canal and thecal sac. We then continued to remove osteophytic overgrowth and disc material decompressing the neural foramina and exiting nerve roots bilaterally. The scope was angled up and down to help decompress and undercut the vertebral bodies. Once the decompression was completed we could pass a nerve hook circumferentially to assure adequate decompression in the midline and in the neural foramina. So by both visualization and palpation we felt we had an adequate decompression of the neural elements. We then measured the height of the intravertebral disc space and selected a 7 millimeter PTI interbody cage packed with autograft and DBM for C4-5 and an 8 mm graft at C5-6. It was then gently positioned in the intravertebral disc space(s) and countersunk. I then used a 42 mm Alphatec plate and placed variable angle screws into the vertebral bodies of each level and locked them into position. The wound was irrigated with bacitracin solution,  checked for hemostasis which was established  and confirmed. Once meticulous hemostasis was achieved, we then proceeded with closure with the assistance of my nurse practitioner. The platysma was closed with interrupted 3-0 undyed Vicryl suture, the subcuticular layer was closed with interrupted 3-0 undyed Vicryl suture. The skin edges were approximated with steristrips. The drapes were removed. A sterile dressing was applied. The patient was then awakened from general anesthesia and transferred to the recovery room in stable condition. At the end of the procedure all sponge, needle and instrument counts were correct. ? ? ?PLAN OF CARE: Admit for overnight observation ? ?PATIENT DISPOSITION:  PACU - hemodynamically stable. ?  ?Delay start of Pharmacological VTE agent (>24hrs) due to surgical blood loss or risk of bleeding:  yes ? ? ? ? ? ? ? ? ? ? ? ? ? ? ?

## 2022-01-03 NOTE — Transfer of Care (Signed)
Immediate Anesthesia Transfer of Care Note ? ?Patient: [name] ? ?Procedure(s) Performed: Anterior Cervical Decompression Fusion - Cervical four-Cervical five - Cervical five-Cervical six (Spine Cervical) ? ?Patient Location: PACU ? ?Anesthesia Type:General ? ?Level of Consciousness: awake, alert  and oriented ? ?Airway & Oxygen Therapy: Patient connected to face mask oxygen ? ?Post-op Assessment: Post -op Vital signs reviewed and stable ? ?Post vital signs: stable ? ?Last Vitals:  ?Vitals Value Taken Time  ?BP 168/100 01/03/22 1003  ?Temp    ?Pulse 72 01/03/22 1003  ?Resp 12 01/03/22 1003  ?SpO2 100 % 01/03/22 1003  ?Vitals shown include unvalidated device data. ? ?Last Pain:  ?Vitals:  ? 01/03/22 0611  ?TempSrc:   ?PainSc: 8   ?   ? ?Patients Stated Pain Goal: 2 (01/03/22 9311) ? ?Complications: No notable events documented. ?

## 2022-01-03 NOTE — Plan of Care (Signed)
  Problem: Safety: Goal: Ability to remain free from injury will improve Outcome: Progressing   

## 2022-01-03 NOTE — H&P (Signed)
Subjective:   Patient is a 61 y.o. female admitted for cervical radiculopathy. The patient first presented to me with complaints of neck pain, shooting pains in the arm(s), and numbness of the arm(s). Onset of symptoms was several months ago. The pain is described as sharp, stabbing, and throbbing and occurs all day. The pain is rated severe, and is located in the neck and radiates to the LUE. The symptoms have been progressive. Symptoms are exacerbated by extending head backwards, and are relieved by none.  Previous work up includes MRI of cervical spine, results: spinal stenosis.  Past Medical History:  Diagnosis Date   Anxiety    Aortic atherosclerosis (HCC) 04/16/2021   Atypical angina (HCC)    Back pain    related to spinal stenosis and disc problem, radiates down left buttocks to leg., weakness occ.   Chest pain    a. 03/2015 Cath: nl cors; b. 03/2021 Cor CTA: Ca2+ = 0. Nl Cors.   Chronic pain syndrome    Dyspnea    GERD (gastroesophageal reflux disease)    Grade I diastolic dysfunction    Headache    Hyperlipidemia    Hypertension    Lumbar post-laminectomy syndrome    LVH (left ventricular hypertrophy) 12/15/2020   a. 11/2020 Echo: EF 65-70%, no rwma, sev asymm LVH with IVSd 1.9 cm. No LVOT obs @ rest. Gr1 DD. Triv MR.   PONV (postoperative nausea and vomiting)    Pulmonary nodules    a. 03/2021 CT Chest: 3mm pulm nodules in bilat lower lobes. F/u 1 yr.   Right foot drop    Syncope    a. 11/2020 Zio: No significant arrhythmias.   Vaginal foreign object    "Uses Femring"    Past Surgical History:  Procedure Laterality Date   ABDOMINAL HYSTERECTOMY     BIOPSY  12/16/2020   Procedure: BIOPSY;  Surgeon: Meryl Dare, MD;  Location: [facility] ENDOSCOPY;  Service: Endoscopy;;   CARDIAC CATHETERIZATION N/A 04/18/2015   Procedure: Left Heart Cath and Coronary Angiography;  Surgeon: Rinaldo Cloud, MD;  Location: [facility] INVASIVE CV LAB;  Service: Cardiovascular;  Laterality: N/A;   COLONOSCOPY  W/ BIOPSIES AND POLYPECTOMY     ESOPHAGOGASTRODUODENOSCOPY (EGD) WITH PROPOFOL N/A 12/16/2020   Procedure: ESOPHAGOGASTRODUODENOSCOPY (EGD) WITH PROPOFOL;  Surgeon: Meryl Dare, MD;  Location: [facility] ENDOSCOPY;  Service: Endoscopy;  Laterality: N/A;   FOOT SURGERY Bilateral    [facility] "bunion,bone spur, tendon" (1) -6'16, (1)-10'16   IR EPIDUROGRAPHY  07/21/2018   LUMBAR LAMINECTOMY/DECOMPRESSION MICRODISCECTOMY Bilateral 12/28/2015   Procedure: MICRO LUMBAR DECOMPRESSION L4 - L5 BILATERALLY;  Surgeon: Jene Every, MD;  Location: [facility] ORS;  Service: Orthopedics;  Laterality: Bilateral;   LUMBAR LAMINECTOMY/DECOMPRESSION MICRODISCECTOMY Bilateral 03/04/2018   Procedure: Revision of Microlumbar Decompression Bilateral Lumbar Four-Five;  Surgeon: Jene Every, MD;  Location: [facility] OR;  Service: Orthopedics;  Laterality: Bilateral;  90 mins   SAVORY DILATION N/A 12/16/2020   Procedure: SAVORY DILATION;  Surgeon: Meryl Dare, MD;  Location: [facility] ENDOSCOPY;  Service: Endoscopy;  Laterality: N/A;   SPINAL CORD STIMULATOR INSERTION N/A 09/28/2019   Procedure: THORACIC SPINAL CORD STIMULATOR INSERTION;  Surgeon: Venita Lick, MD;  Location: [facility] OR;  Service: Orthopedics;  Laterality: N/A;  2.5 hrs   SPINAL CORD STIMULATOR REMOVAL N/A 05/27/2021   Procedure: LUMBAR SPINAL CORD STIMULATOR REMOVAL;  Surgeon: Lucy Chris, MD;  Location: [facility] ORS;  Service: Neurosurgery;  Laterality: N/A;   TUBAL LIGATION     WISDOM TOOTH EXTRACTION  WOUND EXPLORATION N/A 03/04/2018   Procedure: EXPLORATION OF LUMBAR DECOMPRESSION WOUND;  Surgeon: Jene Every, MD;  Location: [facility] OR;  Service: Orthopedics;  Laterality: N/A;    Allergies  Allergen Reactions   Cephalosporins Anaphylaxis   Penicillins Anaphylaxis and Hives    Did it involve swelling of the face/tongue/throat, SOB, or low BP? Yes Did it involve sudden or severe rash/hives, skin peeling, or any reaction on the inside of your mouth or nose? Yes Did you  need to seek medical attention at a hospital or doctor's office? Yes When did it last happen?      10+ years If all above answers are NO, may proceed with cephalosporin use.     Anesthetics, Amide Nausea And Vomiting    Does not know name of it. [facility]    Betadine [Povidone Iodine] Other (See Comments)    "Skin Burn" caused scar on Left buttock   Latex Hives, Itching and Rash   Peach [Prunus Persica] Hives   Claritin [Loratadine]     Rash, pruritis     Social History   Tobacco Use   Smoking status: Never   Smokeless tobacco: Never  Substance Use Topics   Alcohol use: No    Family History  Problem Relation Age of Onset   Heart attack Mother    Lung cancer Father    Cancer Father    Pancreatic cancer Sister    Breast cancer Sister 17   Throat cancer Brother    Multiple myeloma Sister    Breast cancer Sister        diagnosed in her 39's   Heart attack Sister    Stomach cancer Cousin    Colon cancer Neg Hx    Prior to Admission medications   Medication Sig Start Date End Date Taking? Authorizing Provider  acetaminophen (TYLENOL) 500 MG tablet Take 1,000 mg by mouth every 6 (six) hours as needed for headache.   Yes [provider]  ALPRAZolam Prudy Feeler) 0.5 MG tablet Take 1/2 to 1 tablet (0.25-0.5 mg total) by mouth at bedtime as needed for anxiety. Patient taking differently: Take 0.5 mg by mouth at bedtime as needed for anxiety. 05/03/21  Yes [place], [name], MD  atorvastatin (LIPITOR) 20 MG tablet TAKE 1 TABLET BY MOUTH ONCE DAILY 06/04/21 06/04/22 Yes [place], [name], MD  baclofen (LIORESAL) 10 MG tablet TAKE 1 TABLET BY MOUTH THREE TIMES DAILY AS NEEDED 01/17/21 01/17/22 Yes Sheran Luz, MD  betamethasone dipropionate 0.05 % lotion Apply a small amount to affected area once a day 12/16/21  Yes   carvedilol (COREG) 25 MG tablet Take 2 tablets (50 mg total) by mouth 2 (two) times daily with a meal. Patient taking differently: Take 25  mg by mouth 2 (two) times daily with a meal. 07/10/21 07/10/22 Yes Hilty, Lisette Abu, MD  cholecalciferol (VITAMIN D3) 25 MCG (1000 UNIT) tablet Take 1,000 Units by mouth daily.   Yes [provider]  diphenhydrAMINE (BENADRYL) 25 MG tablet Take 25 mg by mouth daily as needed for itching.   Yes [provider]  Estradiol Acetate (FEMRING) 0.05 MG/24HR RING USE ONE RING VAGINALLY EVERY 3 MONTHS AS DIRECTED 12/10/21  Yes   gabapentin (NEURONTIN) 400 MG capsule Take 1 capsule by mouth three times daily. 08/26/21  Yes   methocarbamol (ROBAXIN) 500 MG tablet TAKE 1 TABLET BY MOUTH AT BEDTIME AS NEEDED 09/23/21  Yes   neomycin-bacitracin-polymyxin (NEOSPORIN) ointment Apply 1 application  topically as needed for wound care.   Yes [provider]  oxyCODONE-acetaminophen (PERCOCET) 7.5-325 MG tablet Take 1 tablet by mouth daily as needed for severe pain.   Yes [provider]  telmisartan (MICARDIS) 40 MG tablet Take 1 tablet (40 mg total) by mouth daily. 07/10/21  Yes Hilty, Lisette Abu, MD  traMADol (ULTRAM) 50 MG tablet Take 1 tablet (50 mg total) by mouth every 6 (six) hours as needed. 11/27/21  Yes Ranelle Oyster, MD  busPIRone (BUSPAR) 5 MG tablet Take 1 tablet by mouth 2  times daily. Patient taking differently: Take 5 mg by mouth 2 (two) times daily as needed (anxiety). 10/04/21   [place], [name], MD  DULoxetine (CYMBALTA) 60 MG capsule Take 1 capsule (60 mg total) by mouth daily. Patient not taking: Reported on 12/25/2021 05/03/21   [place], [name], MD  meclizine (ANTIVERT) 25 MG tablet Take 25 mg by mouth 3 (three) times daily as needed for dizziness.    [provider]  predniSONE (DELTASONE) 20 MG tablet Take 1 tab 3 x daily for 4 days then 1 tab 2x daily for 4 days then 1 tab once daily for 4 days, then 1/2 tab daily for 4 days and off. Patient not taking: Reported on 12/17/2021 11/27/21   Ranelle Oyster, MD  senna-docusate (SENOKOT-S) 8.6-50 MG tablet Take 1  tablet by mouth 2 (two) times daily. Patient not taking: Reported on 12/25/2021 05/31/21   Susanne Borders, PA     Review of Systems  Positive ROS: neg  All other systems have been reviewed and were otherwise negative with the exception of those mentioned in the HPI and as above.  Objective: Vital signs in last 24 hours: Temp:  [97.5 F (36.4 C)] 97.5 F (36.4 C) (03/10 0553) Pulse Rate:  [78] 78 (03/10 0553) Resp:  [17] 17 (03/10 0553) BP: (149)/(75) 149/75 (03/10 0553) SpO2:  [96 %] 96 % (03/10 0553) Weight:  [76.7 kg] 76.7 kg (03/10 0553)  General Appearance: Alert, cooperative, no distress, appears stated age Head: Normocephalic, without obvious abnormality, atraumatic Eyes: PERRL, conjunctiva/corneas clear, EOM's intact      Neck: Supple, symmetrical, trachea midline, Back: Symmetric, no curvature, ROM normal, no CVA tenderness Lungs:  respirations unlabored Heart: Regular rate and rhythm Abdomen: Soft, non-tender Extremities: Extremities normal, atraumatic, no cyanosis or edema Pulses: 2+ and symmetric all extremities Skin: Skin color, texture, turgor normal, no rashes or lesions  NEUROLOGIC:  Mental status: Alert and oriented x4, no aphasia, good attention span, fund of knowledge and memory  Motor Exam - grossly normal Sensory Exam - grossly normal Reflexes: 1+ Coordination - grossly normal Gait - grossly normal Balance - grossly normal Cranial Nerves: I: smell Not tested  II: visual acuity  OS: nl    OD: nl  II: visual fields Full to confrontation  II: pupils Equal, round, reactive to light  III,VII: ptosis None  III,IV,VI: extraocular muscles  Full ROM  V: mastication Normal  V: facial light touch sensation  Normal  V,VII: corneal reflex  Present  VII: facial muscle function - upper  Normal  VII: facial muscle function - lower Normal  VIII: hearing Not tested  IX: soft palate elevation  Normal  IX,X: gag reflex Present  XI: trapezius strength  5/5   XI: sternocleidomastoid strength 5/5  XI: neck flexion strength  5/5  XII: tongue strength  Normal    Data Review Lab Results  Component Value Date   WBC  5.1 12/31/2021   HGB 13.1 12/31/2021   HCT 39.6 12/31/2021   MCV 90.6 12/31/2021   PLT 256 12/31/2021   Lab Results  Component Value Date   NA 139 12/31/2021   K 4.1 12/31/2021   CL 107 12/31/2021   CO2 25 12/31/2021   BUN 15 12/31/2021   CREATININE 1.09 (H) 12/31/2021   GLUCOSE 88 12/31/2021   Lab Results  Component Value Date   INR 1.1 12/31/2021    Assessment:   Cervical neck pain with herniated nucleus pulposus/ spondylosis/ stenosis at C4-5 c5-6. Estimated body mass index is 28.12 kg/m as calculated from the following:   Height as of this encounter: 5\' 5"  (1.651 m).   Weight as of this encounter: 76.7 kg.  Patient has failed conservative therapy. Planned surgery : ACDF C4-5 C5-6  Plan:   I explained the condition and procedure to the patient and answered any questions.  Patient wishes to proceed with procedure as planned. Understands risks/ benefits/ and expected or typical outcomes.  Tia Alert 01/03/2022 7:29 AM

## 2022-01-03 NOTE — Anesthesia Procedure Notes (Addendum)
Procedure Name: Intubation ?Date/Time: 01/03/2022 7:50 AM ?Performed by: Lavell Luster, CRNA ?Pre-anesthesia Checklist: Patient identified, Emergency Drugs available, Suction available, Patient being monitored and Timeout performed ?Patient Re-evaluated:Patient Re-evaluated prior to induction ?Oxygen Delivery Method: Circle system utilized ?Preoxygenation: Pre-oxygenation with 100% oxygen ?Induction Type: IV induction ?Ventilation: Mask ventilation without difficulty ?Laryngoscope Size: Glidescope and 3 ?Grade View: Grade I ?Tube type: Oral ?Tube size: 7.5 mm ?Number of attempts: 1 ?Airway Equipment and Method: Stylet ?Placement Confirmation: ETT inserted through vocal cords under direct vision, positive ETCO2 and breath sounds checked- equal and bilateral ?Secured at: 22 cm ?Tube secured with: Tape ?Dental Injury: Teeth and Oropharynx as per pre-operative assessment  ?Comments: Atraumatic induction and intubation with video laryngoscope.  Head and neck maintained in neutral position throughout.  Henderson Cloud, CRNA ? ? ? ? ?

## 2022-01-04 MED ORDER — DIPHENHYDRAMINE HCL 25 MG PO CAPS
25.0000 mg | ORAL_CAPSULE | Freq: Four times a day (QID) | ORAL | Status: DC | PRN
  Administered 2022-01-04 (×4): 25 mg via ORAL
  Administered 2022-01-05 – 2022-01-06 (×2): 50 mg via ORAL
  Filled 2022-01-04 (×2): qty 2
  Filled 2022-01-04 (×4): qty 1

## 2022-01-04 NOTE — Evaluation (Signed)
Occupational Therapy Evaluation Patient Details Name: [name] MRN: [id number] DOB: 1961-01-18 Today's Date: 01/04/2022   History of Present Illness Pt is a 61 y/o F s/p ACDF C4-5 adn C5-6. PMH includes anxiety, dyspnea, GERD, and back surgery   Clinical Impression   Pt requires assist at baseline for ADLs, states her spouse often helps her get set up for showering. Pt uses rollator at baseline. Pt lives with spouse, who will be available to assist at d/c, however she reports he is not returning home until tomorrow (3/12) afternoon. Pt currently set up - min A for ADLs, supervision for bed mobility and close min guard for transfers since pt reported falling recently. Pt demonstrates R knee instability despite use of AFO. Educated pt on precautions, compensatory strategies for grooming, dressing, and bathing. Pt verbalized understanding, also demonstrates good use of log rolling technique she learned from previous back surgery. Pt presenting with impairments listed below, will follow acutely. Recommend HHOT at d/c.     Recommendations for follow up therapy are one component of a multi-disciplinary discharge planning process, led by the attending physician.  Recommendations may be updated based on patient status, additional functional criteria and insurance authorization.   Follow Up Recommendations  Home health OT    Assistance Recommended at Discharge Intermittent Supervision/Assistance  Patient can return home with the following A little help with walking and/or transfers;A little help with bathing/dressing/bathroom;Assistance with cooking/housework    Functional Status Assessment  Patient has had a recent decline in their functional status and demonstrates the ability to make significant improvements in function in a reasonable and predictable amount of time.  Equipment Recommendations  None recommended by OT;Other (comment) (pt has all necessary DME)    Recommendations  for Other Services PT consult     Precautions / Restrictions Precautions Precautions: Cervical;Fall Precaution Comments: verbally reviewed precautions, pt able to verbalize 3/3 precautions Required Braces or Orthoses: Other Brace (no brace needed per MD, however pt now requesting soft collar, RN aware) Other Brace: AFO on RLE Restrictions Weight Bearing Restrictions: No      Mobility Bed Mobility Overal bed mobility: Needs Assistance Bed Mobility: Sidelying to Sit, Sit to Sidelying   Sidelying to sit: Supervision     Sit to sidelying: Min assist General bed mobility comments: assist to bring RLE back in bed    Transfers Overall transfer level: Needs assistance Equipment used: Rolling walker (2 wheels) Transfers: Sit to/from Stand Sit to Stand: Min guard           General transfer comment: close min guard sinec pt reporting prior fall      Balance Overall balance assessment: Needs assistance Sitting-balance support: Feet supported Sitting balance-Leahy Scale: Good     Standing balance support: During functional activity, Reliant on assistive device for balance Standing balance-Leahy Scale: Poor Standing balance comment: reliant on RW                           ADL either performed or assessed with clinical judgement   ADL Overall ADL's : Needs assistance/impaired Eating/Feeding: Set up;Sitting   Grooming: Set up;Sitting   Upper Body Bathing: Sitting;Minimal assistance Upper Body Bathing Details (indicate cue type and reason): to wash back Lower Body Bathing: Minimal assistance;Sitting/lateral leans   Upper Body Dressing : Set up;Sitting Upper Body Dressing Details (indicate cue type and reason): to don gown Lower Body Dressing: Moderate assistance;Sitting/lateral leans Lower Body Dressing Details (indicate cue type and  reason): to don shoe/AFO/sock Toilet Transfer: Min guard;Rolling walker (2 wheels)   Toileting- Clothing Manipulation and  Hygiene: Set up;Sitting/lateral lean Toileting - Clothing Manipulation Details (indicate cue type and reason): completes pericare     Functional mobility during ADLs: Min guard;Rolling walker (2 wheels)       Vision   Vision Assessment?: No apparent visual deficits     Perception     Praxis      Pertinent Vitals/Pain Pain Assessment Pain Assessment: Faces Pain Score: 3  Faces Pain Scale: Hurts little more Pain Location: neck Pain Descriptors / Indicators: Discomfort, Constant Pain Intervention(s): Limited activity within patient's tolerance, Monitored during session, Repositioned     Hand Dominance Right   Extremity/Trunk Assessment Upper Extremity Assessment Upper Extremity Assessment: Overall WFL for tasks assessed   Lower Extremity Assessment Lower Extremity Assessment: Defer to PT evaluation   Cervical / Trunk Assessment Cervical / Trunk Assessment: Neck Surgery   Communication Communication Communication: No difficulties   Cognition Arousal/Alertness: Awake/alert Behavior During Therapy: WFL for tasks assessed/performed Overall Cognitive Status: Within Functional Limits for tasks assessed                                 General Comments: A & O x4     General Comments  pt reporting dizziness after ambulating from bathroom, BP 161/85 once returning supine in bed, RN notified    Exercises     Shoulder Instructions      Home Living Family/patient expects to be discharged to:: Private residence Living Arrangements: Spouse/significant other Available Help at Discharge: Family Type of Home: House Home Access: Stairs to enter Technical brewer of Steps: 4 Entrance Stairs-Rails: Right Home Layout: One level     Bathroom Shower/Tub: Occupational psychologist: Handicapped height Bathroom Accessibility: Yes How Accessible: Accessible via walker Home Equipment: Conservation officer, nature (2 wheels);Rollator (4 wheels);BSC/3in1;Shower seat  (adaptive equipment -reacher)   Additional Comments: uses AFO on RLE since 2019 lumbar surgery, spouse will be off to assist all next week, family assist PRN      Prior Functioning/Environment Prior Level of Function : Needs assist             Mobility Comments: has been using rollator prior to surgery ADLs Comments: spouse provides set up A for ADLs.        OT Problem List: Decreased strength;Decreased range of motion;Decreased activity tolerance;Impaired balance (sitting and/or standing);Decreased knowledge of use of DME or AE;Decreased knowledge of precautions      OT Treatment/Interventions: Self-care/ADL training;Therapeutic exercise;DME and/or AE instruction;Therapeutic activities;Patient/family education;Balance training    OT Goals(Current goals can be found in the care plan section) Acute Rehab OT Goals Patient Stated Goal: none stated OT Goal Formulation: With patient Time For Goal Achievement: 01/18/22 Potential to Achieve Goals: Good ADL Goals Pt Will Perform Upper Body Dressing: with modified independence;sitting Pt Will Perform Lower Body Dressing: with min guard assist;sitting/lateral leans;sit to/from stand Pt Will Transfer to Toilet: with supervision;regular height toilet;ambulating Pt Will Perform Tub/Shower Transfer: Shower transfer;rolling walker;with supervision  OT Frequency: Min 2X/week    Co-evaluation              AM-PAC OT "6 Clicks" Daily Activity     Outcome Measure Help from another person eating meals?: None Help from another person taking care of personal grooming?: A Little Help from another person toileting, which includes using toliet, bedpan, or urinal?: A Little  Help from another person bathing (including washing, rinsing, drying)?: A Little Help from another person to put on and taking off regular upper body clothing?: A Little Help from another person to put on and taking off regular lower body clothing?: A Lot 6 Click Score:  18   End of Session Equipment Utilized During Treatment: Gait belt;Rolling walker (2 wheels);Other (comment) (RLE AFO) Nurse Communication: Mobility status;Other (comment) (notified of BP and request for PT eval)  Activity Tolerance: Patient tolerated treatment well Patient left: in bed;with call bell/phone within reach;with bed alarm set  OT Visit Diagnosis: Unsteadiness on feet (R26.81);Other abnormalities of gait and mobility (R26.89);Muscle weakness (generalized) (M62.81);History of falling (Z91.81);Pain                Time: 6644-0347 OT Time Calculation (min): 45 min Charges:  OT General Charges $OT Visit: 1 Visit OT Evaluation $OT Eval Moderate Complexity: 1 Mod OT Treatments $Self Care/Home Management : 23-37 mins  Lynnda Child, OTD, OTR/L Acute Rehab [phone]) [phone] - [place] 01/04/2022, 9:38 AM

## 2022-01-04 NOTE — Progress Notes (Signed)
Patient ID: [name], female   DOB: Jul 10, 1961, 61 y.o.   MRN: [id number] ?BP (!) 156/82 (BP Location: Left Arm)   Pulse 69   Temp 98.2 ?F (36.8 ?C) (Oral)   Resp 16   Ht '5\' 5"'$  (1.651 m)   Wt 80.7 kg   SpO2 99%   BMI 29.61 kg/m?  ?Alert and oriented x 4 ?Moving all extremities, slight weakness in the right lower extremity ?Will continue PT ?Hold on discharge ?Wound is clean, dry, no signs of infection ?

## 2022-01-04 NOTE — Progress Notes (Addendum)
Patient complains of persistent itching at incision site. Tegaderm covering honeycomb dressing beginning to lift at edges from patient rubbing. She endorses that it itched earlier prior to transfer but almost unbearable now. She asked if the dressing could come off and that it itched so bad that she wanted to take it off. She was advised not to do that. She agreed she would not, but it was that intense.Dressing was covered with sterile gauze after tegaderm was lifted from skin but not to expose dressing.Patient advised the dressing could not be removed On call provider paged for antihistamine. Order received. ?

## 2022-01-04 NOTE — Progress Notes (Signed)
Orthopedic Tech Progress Note ?Patient Details:  ?[name] ?1961/02/02 ?[id number] ? ?Ortho Devices ?Type of Ortho Device: Soft collar ?Ortho Device/Splint Location: Neck ?Ortho Device/Splint Interventions: Ordered ?  ?  ? ?Shambhavi Salley E Andre Swander ?01/04/2022, 9:39 AM ? ?

## 2022-01-04 NOTE — Evaluation (Signed)
Physical Therapy Evaluation ?Patient Details ?Name: [name] ?MRN: [id number] ?DOB: Oct 11, 1961 ?Today's Date: 01/04/2022 ? ?History of Present Illness ? Pt is a 61 y/o F s/p ACDF C4-5 adn C5-6. PMH includes anxiety, dyspnea, GERD, and back surgery  ?Clinical Impression ? Patient admitted following above procedure. Patient with hx of R foot drop and utilizes AFO for ambulation. Patient presents with R LE weakness, impaired balance, and decreased activity tolerance. Patient ambulated short distance before needing seated rest break due to R LE weakness and instability resulting in mild buckling. Reinforced cervical precautions and brace wear for comfort, patient verbalized understanding. Patient will benefit from skilled PT services during acute stay to address listed deficits. Recommend HHPT at discharge to maximize functional independence and safety in the home.  ?   ? ?Recommendations for follow up therapy are one component of a multi-disciplinary discharge planning process, led by the attending physician.  Recommendations may be updated based on patient status, additional functional criteria and insurance authorization. ? ?Follow Up Recommendations Home health PT ? ?  ?Assistance Recommended at Discharge Intermittent Supervision/Assistance  ?Patient can return home with the following ?   ? ?  ?Equipment Recommendations None recommended by PT  ?Recommendations for Other Services ?    ?  ?Functional Status Assessment Patient has had a recent decline in their functional status and demonstrates the ability to make significant improvements in function in a reasonable and predictable amount of time.  ? ?  ?Precautions / Restrictions Precautions ?Precautions: Cervical;Fall ?Precaution Comments: verbally reviewed precautions, pt able to verbalize 3/3 precautions ?Required Braces or Orthoses: Other Brace;Cervical Brace ?Cervical Brace: Soft collar;For comfort ?Other Brace: no brace needed per  order ?Restrictions ?Weight Bearing Restrictions: No  ? ?  ? ?Mobility ? Bed Mobility ?Overal bed mobility: Needs Assistance ?Bed Mobility: Rolling, Sidelying to Sit, Sit to Sidelying ?Rolling: Supervision ?Sidelying to sit: Supervision ?  ?  ?Sit to sidelying: Min assist ?General bed mobility comments: Assist to bring R LE back into bed ?  ? ?Transfers ?Overall transfer level: Needs assistance ?Equipment used: Rolling Terrionna Bridwell (2 wheels) ?Transfers: Sit to/from Stand ?Sit to Stand: Min guard ?  ?  ?  ?  ?  ?General transfer comment: min guard for safety ?  ? ?Ambulation/Gait ?Ambulation/Gait assistance: Min guard ?Gait Distance (Feet): 8 Feet (+8') ?Assistive device: Rolling Kamayah Pillay (2 wheels) ?Gait Pattern/deviations: Decreased dorsiflexion - right, Decreased stride length, Step-through pattern, Knees buckling ?Gait velocity: decreased ?  ?  ?General Gait Details: R knee instability noted with ambulation. Min guard for safety. Seated rest break after 8' ? ?Stairs ?  ?  ?  ?  ?  ? ?Wheelchair Mobility ?  ? ?Modified Rankin (Stroke Patients Only) ?  ? ?  ? ?Balance Overall balance assessment: Needs assistance ?Sitting-balance support: Feet supported ?Sitting balance-Leahy Scale: Good ?  ?  ?Standing balance support: Bilateral upper extremity supported, Reliant on assistive device for balance ?Standing balance-Leahy Scale: Poor ?Standing balance comment: reliant on RW ?  ?  ?  ?  ?  ?  ?  ?  ?  ?  ?  ?   ? ? ? ?Pertinent Vitals/Pain Pain Assessment ?Pain Assessment: Faces ?Faces Pain Scale: Hurts little more ?Pain Location: neck ?Pain Descriptors / Indicators: Discomfort, Constant ?Pain Intervention(s): Monitored during session  ? ? ?Home Living Family/patient expects to be discharged to:: Private residence ?Living Arrangements: Spouse/significant other ?Available Help at Discharge: Family ?Type of Home: House ?Home Access: Stairs to enter ?  Entrance Stairs-Rails: Right ?Entrance Stairs-Number of Steps: 4 ?  ?Home  Layout: One level ?Home Equipment: Conservation officer, nature (2 wheels);Rollator (4 wheels);BSC/3in1;Shower seat;Wheelchair - manual ?   ?  ?Prior Function Prior Level of Function : Needs assist ?  ?  ?  ?  ?  ?  ?Mobility Comments: has been using rollator prior to surgery ?ADLs Comments: spouse provides set up A for ADLs. ?  ? ? ?Hand Dominance  ? Dominant Hand: Right ? ?  ?Extremity/Trunk Assessment  ? Upper Extremity Assessment ?Upper Extremity Assessment: Defer to OT evaluation ?  ? ?Lower Extremity Assessment ?Lower Extremity Assessment: RLE deficits/detail ?RLE Deficits / Details: R foot drop since lumbar surgery in 2019. Has AFO ?  ? ?Cervical / Trunk Assessment ?Cervical / Trunk Assessment: Neck Surgery  ?Communication  ? Communication: No difficulties  ?Cognition Arousal/Alertness: Awake/alert ?Behavior During Therapy: [facility] for tasks assessed/performed ?Overall Cognitive Status: Within Functional Limits for tasks assessed ?  ?  ?  ?  ?  ?  ?  ?  ?  ?  ?  ?  ?  ?  ?  ?  ?General Comments: A & O x4 ?  ?  ? ?  ?General Comments General comments (skin integrity, edema, etc.): reporting dizziness during ambulation but subsides with standing rest break ? ?  ?Exercises    ? ?Assessment/Plan  ?  ?PT Assessment Patient needs continued PT services  ?PT Problem List Decreased strength;Decreased activity tolerance;Decreased balance;Decreased mobility;Decreased coordination;Decreased knowledge of precautions ? ?   ?  ?PT Treatment Interventions DME instruction;Gait training;Stair training;Functional mobility training;Therapeutic activities;Therapeutic exercise;Balance training;Patient/family education   ? ?PT Goals (Current goals can be found in the Care Plan section)  ?Acute Rehab PT Goals ?Patient Stated Goal: to get stronger ?PT Goal Formulation: With patient ?Time For Goal Achievement: 01/18/22 ?Potential to Achieve Goals: Good ? ?  ?Frequency Min 5X/week ?  ? ? ?Co-evaluation   ?  ?  ?  ?  ? ? ?  ?AM-PAC PT "6 Clicks" Mobility   ?Outcome Measure Help needed turning from your back to your side while in a flat bed without using bedrails?: A Little ?Help needed moving from lying on your back to sitting on the side of a flat bed without using bedrails?: A Little ?Help needed moving to and from a bed to a chair (including a wheelchair)?: A Little ?Help needed standing up from a chair using your arms (e.g., wheelchair or bedside chair)?: A Little ?Help needed to walk in hospital room?: A Little ?Help needed climbing 3-5 steps with a railing? : A Little ?6 Click Score: 18 ? ?  ?End of Session Equipment Utilized During Treatment: Gait belt;Cervical collar ?Activity Tolerance: Patient tolerated treatment well ?Patient left: in bed;with call bell/phone within reach ?Nurse Communication: Mobility status ?PT Visit Diagnosis: Unsteadiness on feet (R26.81);Muscle weakness (generalized) (M62.81);Other abnormalities of gait and mobility (R26.89) ?  ? ?Time: 1359-1440 ?PT Time Calculation (min) (ACUTE ONLY): 41 min ? ? ?Charges:   PT Evaluation ?$PT Eval Moderate Complexity: 1 Mod ?PT Treatments ?$Therapeutic Activity: 23-37 mins ?  ?   ? ? ?Jaionna Weisse A. Gilford Rile, PT, DPT ?Acute Rehabilitation Services ?Pager [phone] ?Office [phone] ? ? ?Darl Kuss A Reece Fehnel ?01/04/2022, 2:51 PM ? ?

## 2022-01-05 ENCOUNTER — Observation Stay (HOSPITAL_COMMUNITY): Payer: PPO | Admitting: Registered Nurse

## 2022-01-05 ENCOUNTER — Other Ambulatory Visit: Payer: Self-pay

## 2022-01-05 ENCOUNTER — Inpatient Hospital Stay (HOSPITAL_COMMUNITY)
Admission: AD | Disposition: A | Discharge status: Home Health | Payer: Self-pay | Source: Home / Self Care | Attending: Neurological Surgery

## 2022-01-05 DIAGNOSIS — Z8249 Family history of ischemic heart disease and other diseases of the circulatory system: Secondary | ICD-10-CM | POA: Diagnosis not present

## 2022-01-05 DIAGNOSIS — M4802 Spinal stenosis, cervical region: Secondary | ICD-10-CM | POA: Diagnosis not present

## 2022-01-05 DIAGNOSIS — M4722 Other spondylosis with radiculopathy, cervical region: Secondary | ICD-10-CM | POA: Diagnosis present

## 2022-01-05 DIAGNOSIS — Z91040 Latex allergy status: Secondary | ICD-10-CM | POA: Diagnosis not present

## 2022-01-05 DIAGNOSIS — M50121 Cervical disc disorder at C4-C5 level with radiculopathy: Secondary | ICD-10-CM | POA: Diagnosis present

## 2022-01-05 DIAGNOSIS — I10 Essential (primary) hypertension: Secondary | ICD-10-CM | POA: Diagnosis present

## 2022-01-05 DIAGNOSIS — E785 Hyperlipidemia, unspecified: Secondary | ICD-10-CM | POA: Diagnosis present

## 2022-01-05 DIAGNOSIS — Z20822 Contact with and (suspected) exposure to covid-19: Secondary | ICD-10-CM | POA: Diagnosis present

## 2022-01-05 DIAGNOSIS — M50122 Cervical disc disorder at C5-C6 level with radiculopathy: Secondary | ICD-10-CM | POA: Diagnosis present

## 2022-01-05 DIAGNOSIS — Z800 Family history of malignant neoplasm of digestive organs: Secondary | ICD-10-CM | POA: Diagnosis not present

## 2022-01-05 DIAGNOSIS — R519 Headache, unspecified: Secondary | ICD-10-CM | POA: Diagnosis present

## 2022-01-05 DIAGNOSIS — I700 Atherosclerosis of aorta: Secondary | ICD-10-CM | POA: Diagnosis present

## 2022-01-05 DIAGNOSIS — Z808 Family history of malignant neoplasm of other organs or systems: Secondary | ICD-10-CM | POA: Diagnosis not present

## 2022-01-05 DIAGNOSIS — Z9109 Other allergy status, other than to drugs and biological substances: Secondary | ICD-10-CM | POA: Diagnosis not present

## 2022-01-05 DIAGNOSIS — I110 Hypertensive heart disease with heart failure: Secondary | ICD-10-CM

## 2022-01-05 DIAGNOSIS — F419 Anxiety disorder, unspecified: Secondary | ICD-10-CM | POA: Diagnosis present

## 2022-01-05 DIAGNOSIS — R1310 Dysphagia, unspecified: Secondary | ICD-10-CM | POA: Diagnosis not present

## 2022-01-05 DIAGNOSIS — Z9851 Tubal ligation status: Secondary | ICD-10-CM | POA: Diagnosis not present

## 2022-01-05 DIAGNOSIS — G894 Chronic pain syndrome: Secondary | ICD-10-CM | POA: Diagnosis present

## 2022-01-05 DIAGNOSIS — D649 Anemia, unspecified: Secondary | ICD-10-CM | POA: Diagnosis not present

## 2022-01-05 DIAGNOSIS — Z803 Family history of malignant neoplasm of breast: Secondary | ICD-10-CM | POA: Diagnosis not present

## 2022-01-05 DIAGNOSIS — I509 Heart failure, unspecified: Secondary | ICD-10-CM

## 2022-01-05 DIAGNOSIS — Z880 Allergy status to penicillin: Secondary | ICD-10-CM | POA: Diagnosis not present

## 2022-01-05 DIAGNOSIS — Z801 Family history of malignant neoplasm of trachea, bronchus and lung: Secondary | ICD-10-CM | POA: Diagnosis not present

## 2022-01-05 DIAGNOSIS — Z807 Family history of other malignant neoplasms of lymphoid, hematopoietic and related tissues: Secondary | ICD-10-CM | POA: Diagnosis not present

## 2022-01-05 DIAGNOSIS — Z888 Allergy status to other drugs, medicaments and biological substances status: Secondary | ICD-10-CM | POA: Diagnosis not present

## 2022-01-05 DIAGNOSIS — K219 Gastro-esophageal reflux disease without esophagitis: Secondary | ICD-10-CM | POA: Diagnosis present

## 2022-01-05 HISTORY — PX: HEMATOMA EVACUATION: SHX5118

## 2022-01-05 SURGERY — EVACUATION HEMATOMA
Anesthesia: General | Site: Neck

## 2022-01-05 MED ORDER — 0.9 % SODIUM CHLORIDE (POUR BTL) OPTIME
TOPICAL | Status: DC | PRN
  Administered 2022-01-05 (×2): 1000 mL

## 2022-01-05 MED ORDER — MIDAZOLAM HCL 2 MG/2ML IJ SOLN
INTRAMUSCULAR | Status: AC
  Filled 2022-01-05: qty 2

## 2022-01-05 MED ORDER — PHENYLEPHRINE 40 MCG/ML (10ML) SYRINGE FOR IV PUSH (FOR BLOOD PRESSURE SUPPORT)
PREFILLED_SYRINGE | INTRAVENOUS | Status: DC | PRN
  Administered 2022-01-05 (×2): 40 ug via INTRAVENOUS

## 2022-01-05 MED ORDER — THROMBIN 20000 UNITS EX SOLR
CUTANEOUS | Status: AC
  Filled 2022-01-05: qty 20000

## 2022-01-05 MED ORDER — PHENOL 1.4 % MT LIQD: 1.0000 | OROMUCOSAL | Status: DC | PRN

## 2022-01-05 MED ORDER — VANCOMYCIN HCL 1250 MG/250ML IV SOLN
1250.0000 mg | INTRAVENOUS | Status: AC
Start: 2022-01-05 — End: 2022-01-05
  Administered 2022-01-05: 1250 mg via INTRAVENOUS
  Filled 2022-01-05: qty 250

## 2022-01-05 MED ORDER — ONDANSETRON HCL 4 MG/2ML IJ SOLN
INTRAMUSCULAR | Status: DC | PRN
  Administered 2022-01-05: 4 mg via INTRAVENOUS

## 2022-01-05 MED ORDER — PROPOFOL 10 MG/ML IV BOLUS
INTRAVENOUS | Status: DC | PRN
  Administered 2022-01-05: 120 mg via INTRAVENOUS

## 2022-01-05 MED ORDER — FENTANYL CITRATE (PF) 250 MCG/5ML IJ SOLN
INTRAMUSCULAR | Status: DC | PRN
  Administered 2022-01-05: 50 ug via INTRAVENOUS
  Administered 2022-01-05: 100 ug via INTRAVENOUS

## 2022-01-05 MED ORDER — ACETAMINOPHEN 650 MG RE SUPP: 650.0000 mg | RECTAL | Status: DC | PRN

## 2022-01-05 MED ORDER — SODIUM CHLORIDE 0.9% FLUSH: 3.0000 mL | INTRAVENOUS | Status: DC | PRN

## 2022-01-05 MED ORDER — HYDROMORPHONE HCL 1 MG/ML IJ SOLN
INTRAMUSCULAR | Status: AC
  Administered 2022-01-05: 0.5 mg via INTRAVENOUS
  Filled 2022-01-05: qty 1

## 2022-01-05 MED ORDER — SUGAMMADEX SODIUM 200 MG/2ML IV SOLN
INTRAVENOUS | Status: DC | PRN
  Administered 2022-01-05: 320 mg via INTRAVENOUS

## 2022-01-05 MED ORDER — HYDROMORPHONE HCL 1 MG/ML IJ SOLN
0.2500 mg | INTRAMUSCULAR | Status: DC | PRN
  Administered 2022-01-05: 0.5 mg via INTRAVENOUS

## 2022-01-05 MED ORDER — SUCCINYLCHOLINE CHLORIDE 200 MG/10ML IV SOSY
PREFILLED_SYRINGE | INTRAVENOUS | Status: DC | PRN
  Administered 2022-01-05: 120 mg via INTRAVENOUS

## 2022-01-05 MED ORDER — ACETAMINOPHEN 325 MG PO TABS
650.0000 mg | ORAL_TABLET | ORAL | Status: DC | PRN
  Administered 2022-01-07: 650 mg via ORAL
  Filled 2022-01-05: qty 2

## 2022-01-05 MED ORDER — PROPOFOL 10 MG/ML IV BOLUS
INTRAVENOUS | Status: AC
  Filled 2022-01-05: qty 20

## 2022-01-05 MED ORDER — MIDAZOLAM HCL 2 MG/2ML IJ SOLN
INTRAMUSCULAR | Status: DC | PRN
  Administered 2022-01-05: 2 mg via INTRAVENOUS

## 2022-01-05 MED ORDER — LACTATED RINGERS IV SOLN: INTRAVENOUS | Status: DC | PRN

## 2022-01-05 MED ORDER — ONDANSETRON HCL 4 MG/2ML IJ SOLN
INTRAMUSCULAR | Status: AC
  Filled 2022-01-05: qty 2

## 2022-01-05 MED ORDER — SODIUM CHLORIDE 0.9 % IV SOLN: 250.0000 mL | INTRAVENOUS | Status: DC

## 2022-01-05 MED ORDER — THROMBIN 5000 UNITS EX SOLR
CUTANEOUS | Status: AC
  Filled 2022-01-05: qty 5000

## 2022-01-05 MED ORDER — THROMBIN 20000 UNITS EX SOLR
CUTANEOUS | Status: DC | PRN
  Administered 2022-01-05: 20 mL via TOPICAL

## 2022-01-05 MED ORDER — LIDOCAINE 2% (20 MG/ML) 5 ML SYRINGE
INTRAMUSCULAR | Status: AC
  Filled 2022-01-05: qty 5

## 2022-01-05 MED ORDER — SODIUM CHLORIDE 0.9% FLUSH
3.0000 mL | Freq: Two times a day (BID) | INTRAVENOUS | Status: DC
  Administered 2022-01-05 – 2022-01-07 (×4): 3 mL via INTRAVENOUS

## 2022-01-05 MED ORDER — DEXAMETHASONE SODIUM PHOSPHATE 10 MG/ML IJ SOLN
10.0000 mg | Freq: Once | INTRAMUSCULAR | Status: AC
  Administered 2022-01-05: 10 mg via INTRAVENOUS
  Filled 2022-01-05: qty 1

## 2022-01-05 MED ORDER — ROCURONIUM BROMIDE 10 MG/ML (PF) SYRINGE
PREFILLED_SYRINGE | INTRAVENOUS | Status: AC
  Filled 2022-01-05: qty 10

## 2022-01-05 MED ORDER — ROCURONIUM BROMIDE 10 MG/ML (PF) SYRINGE
PREFILLED_SYRINGE | INTRAVENOUS | Status: DC | PRN
  Administered 2022-01-05: 40 mg via INTRAVENOUS

## 2022-01-05 MED ORDER — LIDOCAINE-EPINEPHRINE 0.5 %-1:200000 IJ SOLN
INTRAMUSCULAR | Status: AC
  Filled 2022-01-05: qty 1

## 2022-01-05 MED ORDER — SUCCINYLCHOLINE CHLORIDE 200 MG/10ML IV SOSY
PREFILLED_SYRINGE | INTRAVENOUS | Status: AC
  Filled 2022-01-05: qty 10

## 2022-01-05 MED ORDER — FENTANYL CITRATE (PF) 250 MCG/5ML IJ SOLN
INTRAMUSCULAR | Status: AC
  Filled 2022-01-05: qty 5

## 2022-01-05 MED ORDER — MENTHOL 3 MG MT LOZG: 1.0000 | LOZENGE | OROMUCOSAL | Status: DC | PRN

## 2022-01-05 SURGICAL SUPPLY — 72 items
ADH SKN CLS APL DERMABOND .7 (GAUZE/BANDAGES/DRESSINGS) ×1
APL PRP STRL LF ISPRP CHG 10.5 (MISCELLANEOUS) ×1
APL SKNCLS STERI-STRIP NONHPOA (GAUZE/BANDAGES/DRESSINGS)
APPLICATOR CHLORAPREP 10.5 ORG (MISCELLANEOUS) ×2 IMPLANT
BAG COUNTER SPONGE SURGICOUNT (BAG) ×3 IMPLANT
BAG SPNG CNTER NS LX DISP (BAG) ×1
BAG SURGICOUNT SPONGE COUNTING (BAG) ×1
BENZOIN TINCTURE PRP APPL 2/3 (GAUZE/BANDAGES/DRESSINGS) IMPLANT
BLADE CLIPPER SURG (BLADE) ×2 IMPLANT
BLADE ULTRA TIP 2M (BLADE) ×2 IMPLANT
BNDG GAUZE ELAST 4 BULKY (GAUZE/BANDAGES/DRESSINGS) IMPLANT
BUR ACORN 6.0 PRECISION (BURR) ×2 IMPLANT
BUR ACORN 6.0MM PRECISION (BURR)
BUR MATCHSTICK NEURO 3.0 LAGG (BURR) IMPLANT
BUR SPIRAL ROUTER 2.3 (BUR) IMPLANT
BUR SPIRAL ROUTER 2.3MM (BUR)
CANISTER SUCT 3000ML PPV (MISCELLANEOUS) ×4 IMPLANT
CARTRIDGE OIL MAESTRO DRILL (MISCELLANEOUS) ×2 IMPLANT
CLIP VESOCCLUDE MED 6/CT (CLIP) IMPLANT
DERMABOND ADVANCED (GAUZE/BANDAGES/DRESSINGS) ×2
DERMABOND ADVANCED .7 DNX12 (GAUZE/BANDAGES/DRESSINGS) IMPLANT
DIFFUSER DRILL AIR PNEUMATIC (MISCELLANEOUS) ×2 IMPLANT
DRAIN JACKSON PRATT 10MM FLAT (MISCELLANEOUS) ×2 IMPLANT
DRAPE NEUROLOGICAL W/INCISE (DRAPES) ×4 IMPLANT
DRAPE SURG 17X23 STRL (DRAPES) IMPLANT
DRAPE WARM FLUID 44X44 (DRAPES) ×2 IMPLANT
DURAPREP 6ML APPLICATOR 50/CS (WOUND CARE) ×2 IMPLANT
ELECT REM PT RETURN 9FT ADLT (ELECTROSURGICAL) ×3
ELECTRODE REM PT RTRN 9FT ADLT (ELECTROSURGICAL) ×2 IMPLANT
EVACUATOR 1/8 PVC DRAIN (DRAIN) IMPLANT
EVACUATOR SILICONE 100CC (DRAIN) ×2 IMPLANT
GAUZE 4X4 16PLY ~~LOC~~+RFID DBL (SPONGE) IMPLANT
GAUZE SPONGE 4X4 12PLY STRL (GAUZE/BANDAGES/DRESSINGS) ×2 IMPLANT
GAUZE SPONGE 4X4 12PLY STRL LF (GAUZE/BANDAGES/DRESSINGS) ×2 IMPLANT
GLOVE EXAM NITRILE XL STR (GLOVE) IMPLANT
GLOVE SURG LTX SZ6.5 (GLOVE) ×2 IMPLANT
GLOVE SURG POLYISO LF SZ6.5 (GLOVE) ×4 IMPLANT
GLOVE SURG UNDER POLY LF SZ7 (GLOVE) ×2 IMPLANT
GOWN STRL REUS W/ TWL LRG LVL3 (GOWN DISPOSABLE) ×4 IMPLANT
GOWN STRL REUS W/ TWL XL LVL3 (GOWN DISPOSABLE) IMPLANT
GOWN STRL REUS W/TWL 2XL LVL3 (GOWN DISPOSABLE) IMPLANT
GOWN STRL REUS W/TWL LRG LVL3 (GOWN DISPOSABLE) ×6
GOWN STRL REUS W/TWL XL LVL3 (GOWN DISPOSABLE)
HEMOSTAT SURGICEL 2X14 (HEMOSTASIS) IMPLANT
KIT BASIN OR (CUSTOM PROCEDURE TRAY) ×4 IMPLANT
KIT TURNOVER KIT B (KITS) ×4 IMPLANT
NDL HYPO 25X1 1.5 SAFETY (NEEDLE) ×2 IMPLANT
NEEDLE HYPO 25X1 1.5 SAFETY (NEEDLE) ×3 IMPLANT
NS IRRIG 1000ML POUR BTL (IV SOLUTION) ×10 IMPLANT
OIL CARTRIDGE MAESTRO DRILL (MISCELLANEOUS)
PACK CRANIOTOMY CUSTOM (CUSTOM PROCEDURE TRAY) ×2 IMPLANT
PACK LAMINECTOMY NEURO (CUSTOM PROCEDURE TRAY) ×2 IMPLANT
PATTIES SURGICAL .5 X.5 (GAUZE/BANDAGES/DRESSINGS) IMPLANT
PATTIES SURGICAL .5 X3 (DISPOSABLE) IMPLANT
PATTIES SURGICAL 1X1 (DISPOSABLE) IMPLANT
SPONGE NEURO XRAY DETECT 1X3 (DISPOSABLE) IMPLANT
SPONGE SURGIFOAM ABS GEL 100 (HEMOSTASIS) ×2 IMPLANT
STAPLER VISISTAT 35W (STAPLE) ×2 IMPLANT
SUT ETHILON 3 0 FSL (SUTURE) IMPLANT
SUT ETHILON 3 0 PS 1 (SUTURE) IMPLANT
SUT NURALON 4 0 TR CR/8 (SUTURE) ×6 IMPLANT
SUT STEEL 0 (SUTURE)
SUT STEEL 0 18XMFL TIE 17 (SUTURE) IMPLANT
SUT VIC AB 2-0 CT2 18 VCP726D (SUTURE) ×4 IMPLANT
SUT VIC AB 3-0 SH 8-18 (SUTURE) ×4 IMPLANT
TOWEL GREEN STERILE (TOWEL DISPOSABLE) ×4 IMPLANT
TOWEL GREEN STERILE FF (TOWEL DISPOSABLE) ×4 IMPLANT
TRAY FOLEY MTR SLVR 16FR STAT (SET/KITS/TRAYS/PACK) ×2 IMPLANT
TUBE CONNECTING 12'X1/4 (SUCTIONS)
TUBE CONNECTING 12X1/4 (SUCTIONS) ×2 IMPLANT
UNDERPAD 30X36 HEAVY ABSORB (UNDERPADS AND DIAPERS) ×2 IMPLANT
WATER STERILE IRR 1000ML POUR (IV SOLUTION) ×4 IMPLANT

## 2022-01-05 NOTE — Op Note (Signed)
01/05/2022 ? ?8:13 AM ? ?PATIENT:  [name]  61 y.o. female who complained of worsening difficulty breathing and swallowing status post an ACDF this past Friday.  ? ?PRE-OPERATIVE DIAGNOSIS:  CERVICAL HEMATOMA ? ?POST-OPERATIVE DIAGNOSIS:  Cervical Serous Fluid Collection ? ?PROCEDURE:  Procedure(s): ?Cervical Wound Exploration ? ?SURGEON: Surgeon(s): ?Ashok Pall, MD ? ?ASSISTANTS:none ? ?ANESTHESIA:   general ? ?EBL:  Total I/O ?In: 650 [I.V.:400; IV Piggyback:250] ?Out: 5 [Blood:5] ? ?BLOOD ADMINISTERED:none ? ?CELL SAVER GIVEN:NA ? ?COUNT:per nursing ? ?DRAINS: none  ? ?SPECIMEN:  Source of Specimen:  cervical wound ? ?DICTATION: [name] was taken to the operating room, intubated, and placed under a general anesthetic without difficulty. The intubation was easy according to the anesthesiologist. She was positioned supine on the operating table with a shoulder roll and a donut for her head. Her neck was prepped and draped in a sterile manner.  ?I opened the incision and cultured some pink serous fluid. I progressed through the soft tissue to the spine. There was no blood, no fluid collection deep, no nothing. I irrigated.  ?The plate, implants, and screws were all in good position. I closed at that time. I approximated the platysma, then the skin. I used dermabond for a sterile dressing. She was extubated and taken to recovery.  ? ?PLAN OF CARE: Admit to inpatient  ? ?PATIENT DISPOSITION:  PACU - hemodynamically stable. ?  ?Delay start of Pharmacological VTE agent (>24hrs) due to surgical blood loss or risk of bleeding:  no ? ?  ?

## 2022-01-05 NOTE — Progress Notes (Addendum)
Patient ID: [name], female   DOB: 1960-11-24, 61 y.o.   MRN: [id number] ?BP (!) 158/105 (BP Location: Left Arm)   Pulse 89   Temp 98.2 ?F (36.8 ?C) (Oral)   Resp (!) 30   Ht '5\' 5"'$  (1.651 m)   Wt 80.7 kg   SpO2 99%   BMI 29.61 kg/m?  ?Called this am about progressive difficulty breathing. At this time she is moving air and is able to speak. Pain with swallowing, reports difficulty moving air.  ?Will take to the OR for wound exploration ?Moving all extremities ?

## 2022-01-05 NOTE — Progress Notes (Addendum)
Patient having a hard time breathing and unable to swallow, 02 sats 96, RR 30s, Dr. Cyndy Freeze is on his way to bedside with plans to take the patient emergently to the OR, rapid response nurse called. ? ?Per pt request family was called and updated.  ? ?Pt transported to short stay and immediately received by the CRNA to take the patient to the OR.  ?

## 2022-01-05 NOTE — Anesthesia Preprocedure Evaluation (Addendum)
Anesthesia Evaluation  ?Patient identified by MRN, date of birth, ID band ?Patient awake ? ? ? ?Reviewed: ?Allergy & Precautions, H&P , NPO status , Patient's Chart, lab work & pertinent test results, reviewed documented beta blocker date and time  ? ?History of Anesthesia Complications ?(+) PONV and history of anesthetic complications ? ?Airway ?Mallampati: III ? ?TM Distance: >3 FB ?Neck ROM: Full ? ? ? Dental ?no notable dental hx. ?(+) Teeth Intact, Dental Advisory Given ?  ?Pulmonary ?neg pulmonary ROS,  ?  ?Pulmonary exam normal ?breath sounds clear to auscultation ? ? ? ? ? ? Cardiovascular ?hypertension, Pt. on medications and Pt. on home beta blockers ?+CHF  ? ?Rhythm:Regular Rate:Normal ? ? ?  ?Neuro/Psych ? Headaches, Anxiety Depression negative psych ROS  ? GI/Hepatic ?Neg liver ROS, GERD  Medicated,  ?Endo/Other  ?negative endocrine ROS ? Renal/GU ?negative Renal ROS  ?negative genitourinary ?  ?Musculoskeletal ? ? Abdominal ?  ?Peds ? Hematology ? ?(+) Blood dyscrasia, anemia ,   ?Anesthesia Other Findings ? ? Reproductive/Obstetrics ?negative OB ROS ? ?  ? ? ? ? ? ? ? ? ? ? ? ? ? ?  ?  ? ? ? ? ? ? ? ?Anesthesia Physical ?Anesthesia Plan ? ?ASA: 3 and emergent ? ?Anesthesia Plan: General  ? ?Post-op Pain Management:   ? ?Induction: Intravenous ? ?PONV Risk Score and Plan: 4 or greater and Ondansetron, Dexamethasone and Midazolam ? ?Airway Management Planned: Oral ETT and Video Laryngoscope Planned ? ?Additional Equipment:  ? ?Intra-op Plan:  ? ?Post-operative Plan: Extubation in OR ? ?Informed Consent: I have reviewed the patients History and Physical, chart, labs and discussed the procedure including the risks, benefits and alternatives for the proposed anesthesia with the patient or authorized representative who has indicated his/her understanding and acceptance.  ? ? ? ?Dental advisory given ? ?Plan Discussed with: CRNA ? ?Anesthesia Plan Comments:    ? ? ? ? ? ? ?Anesthesia Quick Evaluation ? ?

## 2022-01-05 NOTE — Anesthesia Postprocedure Evaluation (Signed)
Anesthesia Post Note ? ?Patient: [name] ? ?Procedure(s) Performed: Cervical Wound Exploration (Neck) ? ?  ? ?Patient location during evaluation: PACU ?Anesthesia Type: General ?Level of consciousness: awake ?Pain management: pain level controlled ?Respiratory status: spontaneous breathing ?Cardiovascular status: stable ?Postop Assessment: no apparent nausea or vomiting ?Anesthetic complications: no ? ? ?No notable events documented. ? ?Last Vitals:  ?Vitals:  ? 01/05/22 0757 01/05/22 0826  ?BP: (!) 192/88 (!) 175/91  ?Pulse: 85 72  ?Resp: 12 16  ?Temp: 36.5 ?C (!) 36.2 ?C  ?SpO2: 100% 96%  ?  ?Last Pain:  ?Vitals:  ? 01/05/22 0826  ?TempSrc:   ?PainSc: 4   ? ? ?  ?  ?  ?  ?  ?  ? ?[name] ? ? ? ? ?

## 2022-01-05 NOTE — Anesthesia Procedure Notes (Signed)
Procedure Name: Intubation ?Date/Time: 01/05/2022 6:57 AM ?Performed by: Wilburn Cornelia, CRNA ?Pre-anesthesia Checklist: Patient identified, Emergency Drugs available, Suction available, Patient being monitored and Timeout performed ?Patient Re-evaluated:Patient Re-evaluated prior to induction ?Oxygen Delivery Method: Circle system utilized ?Preoxygenation: Pre-oxygenation with 100% oxygen ?Induction Type: IV induction ?Ventilation: Mask ventilation without difficulty ?Laryngoscope Size: Mac and 3 ?Grade View: Grade I ?Tube type: Oral ?Tube size: 7.0 mm ?Number of attempts: 1 ?Airway Equipment and Method: Rigid stylet and Video-laryngoscopy ?Placement Confirmation: ETT inserted through vocal cords under direct vision, positive ETCO2, CO2 detector and breath sounds checked- equal and bilateral ?Secured at: 21 cm ?Tube secured with: Tape ?Dental Injury: Teeth and Oropharynx as per pre-operative assessment  ? ? ? ? ?

## 2022-01-05 NOTE — Progress Notes (Signed)
PT Cancellation Note ? ?Patient Details ?Name: [name] ?MRN: [id number] ?DOB: 1961/02/23 ? ? ?Cancelled Treatment:    Reason Eval/Treat Not Completed: Patient at procedure or test/unavailable (OR) ? ?Wyona Almas, PT, DPT ?Acute Rehabilitation Services ?Pager [phone] ?Office [phone] ? ? ? ?[name] ?01/05/2022, 7:14 AM ? ? ?

## 2022-01-05 NOTE — Progress Notes (Signed)
Increased difficulty in swallowing and increased edema and sensitivity to incision site, Dr. Christella Noa aware orders received for one time dose of '10mg'$  decadron IV. Will continue to monitor.   ?

## 2022-01-05 NOTE — Transfer of Care (Signed)
Immediate Anesthesia Transfer of Care Note ? ?Patient: [name] ? ?Procedure(s) Performed: EVACUATION HEMATOMA (Neck) ? ?Patient Location: PACU ? ?Anesthesia Type:General ? ?Level of Consciousness: awake and alert  ? ?Airway & Oxygen Therapy: Patient Spontanous Breathing and Patient connected to nasal cannula oxygen ? ?Post-op Assessment: Report given to RN, Post -op Vital signs reviewed and stable and Patient moving all extremities X 4 ? ?Post vital signs: Reviewed and stable ? ?Last Vitals:  ?Vitals Value Taken Time  ?BP 192/88 01/05/22 0757  ?Temp    ?Pulse 72 01/05/22 0757  ?Resp 12 01/05/22 0757  ?SpO2 100 % 01/05/22 0757  ?Vitals shown include unvalidated device data. ? ?Last Pain:  ?Vitals:  ? 01/05/22 0402  ?TempSrc: Oral  ?PainSc:   ?   ? ?Patients Stated Pain Goal: 3 (01/03/22 2040) ? ?Complications: No notable events documented. ?

## 2022-01-06 ENCOUNTER — Encounter (HOSPITAL_COMMUNITY): Payer: Self-pay | Admitting: Neurosurgery

## 2022-01-06 MED ORDER — POLYETHYLENE GLYCOL 3350 17 G PO PACK
17.0000 g | PACK | Freq: Every day | ORAL | Status: DC
  Administered 2022-01-06 – 2022-01-07 (×2): 17 g via ORAL
  Filled 2022-01-06 (×2): qty 1

## 2022-01-06 MED FILL — Thrombin (Recombinant) For Soln 20000 Unit: CUTANEOUS | Qty: 1 | Status: AC

## 2022-01-06 NOTE — Progress Notes (Signed)
Patient ID: [name], female   DOB: 12/24/1960, 61 y.o.   MRN: [id number] ?Alert and oriented x 4, speech is clear, fluent ?Voice is strong ?Moving all extremities well ?Wound is clean, dry, no signs of infection ?Eating soft foods ?Will discharge tomorrow ?

## 2022-01-06 NOTE — Progress Notes (Addendum)
Physical Therapy Treatment ?Patient Details ?Name: [name] ?MRN: [id number] ?DOB: [birth year] ?Today's Date: 01/06/2022 ? ? ?History of Present Illness Pt is a 61 y/o F s/p ACDF C4-5 adn C5-6. PMH includes anxiety, dyspnea, GERD, and back surgery ? ?  ?PT Comments  ? ? Pt required cuing with bed mobility to adhere to her precautions and assistance with her legs to get back up into bed. She was min assist with standing up EOB but later stood up from the recliner with UE on arms of chair with min guard. Pt required cuing for weight shift side to side with gait. During treatment session patient showed deficits in strength, endurance, activity tolerance. Recommending therapy services with Home Health physical therapy to address the previously stated deficits. Will continue to follow acutely to maximize functional mobility, independence, and safety. Plan to perform stair training and family education in the next session. ?  ?Recommendations for follow up therapy are one component of a multi-disciplinary discharge planning process, led by the attending physician.  Recommendations may be updated based on patient status, additional functional criteria and insurance authorization. ? ?Follow Up Recommendations ? Home health PT ?  ?  ?Assistance Recommended at Discharge Frequent or constant Supervision/Assistance  ?Patient can return home with the following A lot of help with walking and/or transfers;A little help with bathing/dressing/bathroom;Assistance with cooking/housework;Assist for transportation;Help with stairs or ramp for entrance ?  ?Equipment Recommendations ? None recommended by PT  ?  ?Recommendations for Other Services   ? ? ?  ?Precautions / Restrictions Precautions ?Precautions: Cervical;Fall ?Precaution Comments: verbally reviewed precautions with pt ?Cervical Brace: Soft collar;For comfort ?Restrictions ?Weight Bearing Restrictions: No  ?  ? ?Mobility ? Bed Mobility ?Overal bed mobility: Needs  Assistance ?Bed Mobility: Rolling, Sidelying to Sit, Sit to Sidelying ?Rolling: Supervision ?Sidelying to sit: Supervision ?  ?  ?Sit to sidelying: Min assist ?General bed mobility comments: Assist to bring R LE back into bed ?  ? ?Transfers ?Overall transfer level: Needs assistance ?Equipment used: Rolling walker (2 wheels) ?Transfers: Sit to/from Stand ?Sit to Stand: Min assist ?  ?  ?  ?  ?  ?General transfer comment: Pt was min assist to stand up from EOB but then min guard later standing up from the recliner. Utilizes UE for sit to stand transfer. ?  ? ?Ambulation/Gait ?Ambulation/Gait assistance: Mod assist ?Gait Distance (Feet): 25 Feet (x2) ?Assistive device: Rolling walker (2 wheels) ?Gait Pattern/deviations: Decreased dorsiflexion - right, Decreased stride length, Step-through pattern ?Gait velocity: decreased ?  ?  ?General Gait Details: Therapist cued patient to shift weight back and forth between legs and to push up through her walker with her arms to assist her legs. AFO was used on her right foot. As pt fatuigued, therapist provided mod assist hip stability during ambulation. pt reported that gait required a lot of focus. ? ? ?Stairs ?  ?  ?  ?  ?  ? ? ?Wheelchair Mobility ?  ? ?Modified Rankin (Stroke Patients Only) ?  ? ? ?  ?Balance Overall balance assessment: Needs assistance ?Sitting-balance support: Feet supported ?Sitting balance-Leahy Scale: Good ?Sitting balance - Comments: Pt was able to sit EOB independently ?  ?Standing balance support: Bilateral upper extremity supported, Reliant on assistive device for balance ?Standing balance-Leahy Scale: Poor ?Standing balance comment: reliant on RW. Required min guard for standing balance. ?  ?  ?  ?  ?  ?  ?  ?  ?  ?  ?  ?  ? ?  ?  Cognition Arousal/Alertness: Awake/alert ?Behavior During Therapy: [facility] for tasks assessed/performed ?Overall Cognitive Status: Within Functional Limits for tasks assessed ?  ?  ?  ?  ?  ?  ?  ?  ?  ?  ?  ?  ?  ?  ?  ?  ?  ?   ?  ? ?  ?Exercises   ? ?  ?General Comments General comments (skin integrity, edema, etc.): Pt reported that walking was less fatiguing during this session than the previous session. ?  ?  ? ?Pertinent Vitals/Pain Pain Assessment ?Pain Assessment: No/denies pain  ? ? ?Home Living   ?  ?  ?  ?  ?  ?  ?  ?  ?  ?   ?  ?Prior Function    ?  ?  ?   ? ?PT Goals (current goals can now be found in the care plan section)   ? ?  ?Frequency ? ? ? Min 5X/week ? ? ? ?  ?PT Plan Current plan remains appropriate  ? ? ?Co-evaluation   ?  ?  ?  ?  ? ?  ?AM-PAC PT "6 Clicks" Mobility   ?Outcome Measure ? Help needed turning from your back to your side while in a flat bed without using bedrails?: A Little ?Help needed moving from lying on your back to sitting on the side of a flat bed without using bedrails?: A Little ?Help needed moving to and from a bed to a chair (including a wheelchair)?: A Little ?Help needed standing up from a chair using your arms (e.g., wheelchair or bedside chair)?: A Little ?Help needed to walk in hospital room?: A Little ?Help needed climbing 3-5 steps with a railing? : Total ?6 Click Score: 16 ? ?  ?End of Session Equipment Utilized During Treatment: Gait belt;Cervical collar ?Activity Tolerance: Patient tolerated treatment well ?Patient left: in bed;with call bell/phone within reach;with bed alarm set ?  ?PT Visit Diagnosis: Unsteadiness on feet (R26.81);Muscle weakness (generalized) (M62.81);Other abnormalities of gait and mobility (R26.89) ?  ? ? ?Time: 3976-7341 ?PT Time Calculation (min) (ACUTE ONLY): 22 min ? ?Charges:  $Gait Training: 8-22 mins          ?          ? ?Quenton Fetter, SPT ? ? ? ?Quenton Fetter ?01/06/2022, 5:22 PM ? ?

## 2022-01-06 NOTE — Progress Notes (Signed)
Subjective: ?Patient reports doing well, mild neck pain, swallowing and breathing fine ? ?Objective: ?Vital signs in last 24 hours: ?Temp:  [97.7 ?F (36.5 ?C)-98.1 ?F (36.7 ?C)] 97.9 ?F (36.6 ?C) (03/13 3299) ?Pulse Rate:  [64-89] 66 (03/13 0731) ?Resp:  [13-20] 14 (03/13 0731) ?BP: (140-168)/(70-81) 168/81 (03/13 0350) ?SpO2:  [95 %-98 %] 98 % (03/13 0731) ? ?Intake/Output from previous day: ?03/12 0701 - 03/13 0700 ?In: 2524.7 [P.O.:480; I.V.:1794.7; IV Piggyback:250] ?Out: 555 [Urine:550; Blood:5] ?Intake/Output this shift: ?No intake/output data recorded. ? ?Neurologic: Grossly normal ? ?Lab Results: ?Lab Results  ?Component Value Date  ? WBC 5.1 12/31/2021  ? HGB 13.1 12/31/2021  ? HCT 39.6 12/31/2021  ? MCV 90.6 12/31/2021  ? PLT 256 12/31/2021  ? ?Lab Results  ?Component Value Date  ? INR 1.1 12/31/2021  ? ?BMET ?Lab Results  ?Component Value Date  ? NA 139 12/31/2021  ? K 4.1 12/31/2021  ? CL 107 12/31/2021  ? CO2 25 12/31/2021  ? GLUCOSE 88 12/31/2021  ? BUN 15 12/31/2021  ? CREATININE 1.09 (H) 12/31/2021  ? CALCIUM 9.2 12/31/2021  ? ? ?Studies/Results: ?No results found. ? ?Assessment/Plan: ?Postop day 3 acdf with reexploration of wound yesterday morning. We will plan on patient being discharged tomorrow morning as her husband works night shift and will not be home tonight. Continue therapies and pain management. Wound CDI.  ? ? LOS: 1 day  ? ? ?Ocie Cornfield Nitasha Jewel ?01/06/2022, 2:09 PM ? ? ?  ?

## 2022-01-07 ENCOUNTER — Other Ambulatory Visit (HOSPITAL_COMMUNITY): Payer: Self-pay

## 2022-01-07 MED ORDER — TRAMADOL HCL 50 MG PO TABS
50.0000 mg | ORAL_TABLET | Freq: Four times a day (QID) | ORAL | 2 refills | Status: DC | PRN
  Filled 2022-01-07: qty 30, 8d supply, fill #0

## 2022-01-07 MED ORDER — METHOCARBAMOL 500 MG PO TABS
500.0000 mg | ORAL_TABLET | Freq: Four times a day (QID) | ORAL | 0 refills | Status: DC
  Filled 2022-01-07: qty 45, 12d supply, fill #0

## 2022-01-07 NOTE — Progress Notes (Signed)
Occupational Therapy Treatment ?Patient Details ?Name: [name] ?MRN: [id number] ?DOB: 1961/09/18 ?Today's Date: 01/07/2022 ? ? ?History of present illness Pt is a 61 y/o F s/p ACDF C4-5 adn C5-6. Returned to OR on 3/12 for cervical wound exploration.PMH includes anxiety, dyspnea, GERD, and back surgery ?  ?OT comments ? Pt progressing well with ADLs this session, pt now has soft collar for comfort, needing frequent cuing to adhere to cervical precautions during session. Pt set up -mod A for ADLs, supervision for bed mobility with log rolling technique, and min guard for transfers with RW. Pt reporting dizziness needing standing rest break x2 during session when returning from bathroom, BP WNL. Pt presenting with impairments listed below, will follow acutely. Continue to recommend HHOT at d/c.  ? ?Recommendations for follow up therapy are one component of a multi-disciplinary discharge planning process, led by the attending physician.  Recommendations may be updated based on patient status, additional functional criteria and insurance authorization. ?   ?Follow Up Recommendations ? Home health OT  ?  ?Assistance Recommended at Discharge Intermittent Supervision/Assistance  ?Patient can return home with the following ? A little help with walking and/or transfers;A little help with bathing/dressing/bathroom;Assistance with cooking/housework ?  ?Equipment Recommendations ? None recommended by OT;Other (comment) (pt has all necessary DME)  ?  ?Recommendations for Other Services PT consult ? ?  ?Precautions / Restrictions Precautions ?Precautions: Cervical;Fall ?Precaution Comments: verbally reviewed precautions with pt ?Required Braces or Orthoses: Other Brace;Cervical Brace ?Cervical Brace: Soft collar;For comfort ?Restrictions ?Weight Bearing Restrictions: No  ? ? ?  ? ?Mobility Bed Mobility ?Overal bed mobility: Needs Assistance ?Bed Mobility: Rolling, Sidelying to Sit, Sit to Sidelying ?Rolling:  Supervision ?Sidelying to sit: Supervision ?  ?  ?  ?  ?  ? ?Transfers ?Overall transfer level: Needs assistance ?Equipment used: Rolling walker (2 wheels) ?Transfers: Sit to/from Stand ?Sit to Stand: Min guard ?  ?  ?  ?  ?  ?General transfer comment: cues for hand placement ?  ?  ?Balance Overall balance assessment: Needs assistance ?Sitting-balance support: Feet supported ?Sitting balance-Leahy Scale: Good ?Sitting balance - Comments: Pt was able to sit EOB independently ?  ?Standing balance support: Bilateral upper extremity supported, Reliant on assistive device for balance ?Standing balance-Leahy Scale: Fair ?Standing balance comment: stands to pull up pants with minimal external support ?  ?  ?  ?  ?  ?  ?  ?  ?  ?  ?  ?   ? ?ADL either performed or assessed with clinical judgement  ? ?ADL Overall ADL's : Needs assistance/impaired ?Eating/Feeding: Set up;Sitting ?  ?Grooming: Set up;Sitting ?Grooming Details (indicate cue type and reason): washes face, brushes teeth at bedside ?Upper Body Bathing: Sitting;Minimal assistance ?Upper Body Bathing Details (indicate cue type and reason): to wash back ?Lower Body Bathing: Minimal assistance;Sitting/lateral leans ?  ?Upper Body Dressing : Set up;Sitting ?Upper Body Dressing Details (indicate cue type and reason): to don bra and shirt ?Lower Body Dressing: Moderate assistance;Sitting/lateral leans ?Lower Body Dressing Details (indicate cue type and reason): uses modified figure 4 for pants ?Toilet Transfer: Min guard;Rolling walker (2 wheels) ?  ?Toileting- Clothing Manipulation and Hygiene: Set up;Sitting/lateral lean ?Toileting - Clothing Manipulation Details (indicate cue type and reason): completes pericare ?  ?  ?Functional mobility during ADLs: Min guard;Rolling walker (2 wheels) ?  ?  ? ?Extremity/Trunk Assessment Upper Extremity Assessment ?Upper Extremity Assessment: Overall WFL for tasks assessed ?  ?Lower Extremity Assessment ?Lower Extremity  Assessment:  Defer to PT evaluation ?  ?  ?  ? ?Vision   ?Vision Assessment?: No apparent visual deficits ?  ?Perception Perception ?Perception: Not tested ?  ?Praxis Praxis ?Praxis: Not tested ?  ? ?Cognition Arousal/Alertness: Awake/alert ?Behavior During Therapy: [facility] for tasks assessed/performed ?Overall Cognitive Status: Within Functional Limits for tasks assessed ?  ?  ?  ?  ?  ?  ?  ?  ?  ?  ?  ?  ?  ?  ?  ?  ?General Comments: A & O x4 ?  ?  ?   ?Exercises   ? ?  ?Shoulder Instructions   ? ? ?  ?General Comments dizziness with ambulation, BP stable  ? ? ?Pertinent Vitals/ Pain       Pain Assessment ?Pain Assessment: Faces ?Pain Score: 3  ?Faces Pain Scale: Hurts little more ?Pain Location: neck ?Pain Descriptors / Indicators: Discomfort, Constant ?Pain Intervention(s): Limited activity within patient's tolerance, Monitored during session, Repositioned ? ?Home Living   ?  ?  ?  ?  ?  ?  ?  ?  ?  ?  ?  ?  ?  ?  ?  ?  ?  ?  ? ?  ?Prior Functioning/Environment    ?  ?  ?  ?   ? ?Frequency ? Min 2X/week  ? ? ? ? ?  ?Progress Toward Goals ? ?OT Goals(current goals can now be found in the care plan section) ? Progress towards OT goals: Progressing toward goals ? ?Acute Rehab OT Goals ?Patient Stated Goal: none stated ?OT Goal Formulation: With patient ?Time For Goal Achievement: 01/18/22 ?Potential to Achieve Goals: Good ?ADL Goals ?Pt Will Perform Upper Body Dressing: with modified independence;sitting ?Pt Will Perform Lower Body Dressing: with min guard assist;sitting/lateral leans;sit to/from stand ?Pt Will Transfer to Toilet: with supervision;regular height toilet;ambulating ?Pt Will Perform Tub/Shower Transfer: Shower transfer;rolling walker;with supervision  ?Plan Discharge plan remains appropriate;Frequency remains appropriate   ? ?Co-evaluation ? ? ?   ?  ?  ?  ?  ? ?  ?AM-PAC OT "6 Clicks" Daily Activity     ?Outcome Measure ? ? Help from another person eating meals?: None ?Help from another person taking care of  personal grooming?: A Little ?Help from another person toileting, which includes using toliet, bedpan, or urinal?: A Little ?Help from another person bathing (including washing, rinsing, drying)?: A Little ?Help from another person to put on and taking off regular upper body clothing?: A Little ?Help from another person to put on and taking off regular lower body clothing?: A Lot ?6 Click Score: 18 ? ?  ?End of Session Equipment Utilized During Treatment: Gait belt;Rolling walker (2 wheels);Other (comment) (RLE AFO) ? ?OT Visit Diagnosis: Unsteadiness on feet (R26.81);Other abnormalities of gait and mobility (R26.89);Muscle weakness (generalized) (M62.81);History of falling (Z91.81);Pain ?  ?Activity Tolerance Patient tolerated treatment well ?  ?Patient Left in bed;with call bell/phone within reach;with bed alarm set;with nursing/sitter in room ?  ?Nurse Communication Mobility status ?  ? ?   ? ?Time: 4098-1191 ?OT Time Calculation (min): 35 min ? ?Charges: OT General Charges ?$OT Visit: 1 Visit ?OT Treatments ?$Self Care/Home Management : 23-37 mins ? ?Lynnda Child, OTD, OTR/L ?Acute Rehab ?[phone] ? ? ?Kaylyn Lim ?01/07/2022, 9:09 AM ?

## 2022-01-07 NOTE — Progress Notes (Signed)
Physical Therapy Treatment ?Patient Details ?Name: [name] ?MRN: [id number] ?DOB: Mar 31, 1961 ?Today's Date: 01/07/2022 ? ? ?History of Present Illness Pt is a 61 y/o F s/p ACDF C4-5 adn C5-6. Returned to OR on 3/12 for cervical wound exploration.PMH includes anxiety, dyspnea, GERD, and back surgery ? ?  ?PT Comments  ? ? Pt was able to stand with min guard assist incase she had any knee buckling, no knee buckling during session. Pt and husband were educated on how to get into the car while adhering to precautions, ascend/descend stairs, and fall risk prevention. Pt required railing and hand hold assistance on stairs. Plan to go home today and receive therapy services with home health. ?  ?Recommendations for follow up therapy are one component of a multi-disciplinary discharge planning process, led by the attending physician.  Recommendations may be updated based on patient status, additional functional criteria and insurance authorization. ? ?Follow Up Recommendations ? Home health PT ?  ?  ?Assistance Recommended at Discharge Frequent or constant Supervision/Assistance  ?Patient can return home with the following A lot of help with walking and/or transfers;A little help with bathing/dressing/bathroom;Assistance with cooking/housework;Assist for transportation;Help with stairs or ramp for entrance ?  ?Equipment Recommendations ? None recommended by PT  ?  ?Recommendations for Other Services   ? ? ?  ?Precautions / Restrictions Precautions ?Precautions: Cervical;Fall ?Required Braces or Orthoses: Other Brace;Cervical Brace ?Cervical Brace: Soft collar;For comfort ?Restrictions ?Weight Bearing Restrictions: No  ?  ? ?Mobility ? Bed Mobility ?  ?  ?  ?  ?  ?  ?  ?General bed mobility comments: Pt was sitting EOB upon start of the session. ?  ? ?Transfers ?Overall transfer level: Needs assistance ?Equipment used: Rolling walker (2 wheels) ?Transfers: Sit to/from Stand, Bed to chair/wheelchair/BSC ?Sit to  Stand: Min guard ?Stand pivot transfers: Independent ?  ?  ?  ?  ?General transfer comment: cues for hand placement and body position. Cues given to adhere to percautions. ?  ? ?Ambulation/Gait ?  ?  ?  ?  ?  ?  ?  ?  ? ? ?Stairs ?Stairs: Yes ?Stairs assistance: Min assist ?Stair Management: One rail Left, Step to pattern, Forwards ?Number of Stairs: 3 ?General stair comments: Pt required hand hold assist on the right side and cuing for foot placement. ? ? ?Wheelchair Mobility ?  ? ?Modified Rankin (Stroke Patients Only) ?  ? ? ?  ?Balance Overall balance assessment: Needs assistance ?Sitting-balance support: Feet supported ?Sitting balance-Leahy Scale: Good ?Sitting balance - Comments: Pt was able to sit EOB independently ?  ?Standing balance support: Bilateral upper extremity supported, Reliant on assistive device for balance, During functional activity ?Standing balance-Leahy Scale: Fair ?Standing balance comment: Pt requires UE support and min guard for safety during functionl activities. ?  ?  ?  ?  ?  ?  ?  ?  ?  ?  ?  ?  ? ?  ?Cognition Arousal/Alertness: Awake/alert ?Behavior During Therapy: [facility] for tasks assessed/performed ?Overall Cognitive Status: Within Functional Limits for tasks assessed ?  ?  ?  ?  ?  ?  ?  ?  ?  ?  ?  ?  ?  ?  ?  ?  ?  ?  ?  ? ?  ?Exercises   ? ?  ?General Comments General comments (skin integrity, edema, etc.): Pt and her husband were educated on how to minimize fall risk around the house, how to transfer  in and out of the car, and how to safely ascend/descend the stairs. ?  ?  ? ?Pertinent Vitals/Pain Pain Assessment ?Pain Assessment: Faces ?Faces Pain Scale: Hurts a little bit ?Pain Location: neck ?Pain Descriptors / Indicators: Discomfort, Constant  ? ? ?Home Living   ?  ?  ?  ?  ?  ?  ?  ?  ?  ?   ?  ?Prior Function    ?  ?  ?   ? ?PT Goals (current goals can now be found in the care plan section)   ? ?  ?Frequency ? ? ? Min 5X/week ? ? ? ?  ?PT Plan Current plan remains  appropriate  ? ? ?Co-evaluation   ?  ?  ?  ?  ? ?  ?AM-PAC PT "6 Clicks" Mobility   ?Outcome Measure ? Help needed turning from your back to your side while in a flat bed without using bedrails?: None ?Help needed moving from lying on your back to sitting on the side of a flat bed without using bedrails?: None ?Help needed moving to and from a bed to a chair (including a wheelchair)?: A Little ?Help needed standing up from a chair using your arms (e.g., wheelchair or bedside chair)?: A Little ?Help needed to walk in hospital room?: A Little ?Help needed climbing 3-5 steps with a railing? : A Little ?6 Click Score: 20 ? ?  ?End of Session Equipment Utilized During Treatment: Gait belt;Cervical collar ?Activity Tolerance: Patient tolerated treatment well ?Patient left: in bed;with call bell/phone within reach ?  ?PT Visit Diagnosis: Unsteadiness on feet (R26.81);Muscle weakness (generalized) (M62.81);Other abnormalities of gait and mobility (R26.89) ?  ? ? ?Time: 5885-0277 ?PT Time Calculation (min) (ACUTE ONLY): 29 min ? ?Charges:  $Therapeutic Activity: 8-22 mins ?$Self Care/Home Management: 8-22          ?          ? ?Quenton Fetter, SPT ? ? ? ?Quenton Fetter ?01/07/2022, 1:58 PM ? ?

## 2022-01-07 NOTE — Discharge Summary (Signed)
Physician Discharge Summary  ?Patient ID: ?[name] ?MRN: [id number] ?DOB/AGE: [birth year] 61 y.o. ? ?Admit date: 01/03/2022 ?Discharge date: 01/07/2022 ? ?Admission Diagnoses:  Cervical spondylosis with cervical disc herniation C4-5, cervical spondylosis with cervical disc herniation and severe left foraminal stenosis C5-6, left radiculopathy ?   ? ? ?Discharge Diagnoses: same ? ? ?Discharged Condition: good ? ?Hospital Course: The patient was admitted on 01/03/2022 and taken to the operating room where the patient underwent acdf C4-5, 5-6. The patient tolerated the procedure well and was taken to the recovery room and then to the floor in stable condition. She had difficulty swallowing and breathing on postop day 2 and was taken back to the OR for exploration of wound, no hematoma was found.There were no complications. The wound remained clean dry and intact. Pt had appropriate neck soreness. No complaints of arm pain or new N/T/W. The patient remained afebrile with stable vital signs, and tolerated a regular diet. The patient continued to increase activities, and pain was well controlled with oral pain medications.  ? ?Consults: None ? ?Significant Diagnostic Studies:  ?Results for orders placed or performed during the hospital encounter of 01/03/22  ?Surgical PCR Screen  ? Specimen: Nasal Mucosa; Nasal Swab  ?Result Value Ref Range  ? MRSA, PCR NEGATIVE NEGATIVE  ? Staphylococcus aureus NEGATIVE NEGATIVE  ?Aerobic/Anaerobic Culture w Gram Stain (surgical/deep wound)  ? Specimen: Wound  ?Result Value Ref Range  ? Specimen Description WOUND   ? Special Requests CERVICAL WD SPEC A   ? Gram Stain    ?  NO SQUAMOUS EPITHELIAL CELLS SEEN ?FEW WBC SEEN ?NO ORGANISMS SEEN ?  ? Culture    ?  NO GROWTH 2 DAYS ?Performed at [facility], [place] [address], [place], [place] [address] ?  ? Report Status PENDING   ? ? ?DG Cervical Spine 1 View ? ?Result Date: 01/03/2022 ?CLINICAL DATA:  Elective surgery Z41.9  (ICD-10-CM) EXAM: DG CERVICAL SPINE - 1 VIEW COMPARISON:  None. FINDINGS: Fluoro time: 14 seconds Reported radiation: 1.65 mGy Two C-arm fluoroscopic images were obtained intraoperatively and submitted for post operative interpretation. Anterior plate and screws spanning C4-C6 with intervening interbody spacers. Please see the performing provider's procedural report for further detail. IMPRESSION: Intraoperative fluoroscopy, detailed above. Electronically Signed   By: Margaretha Sheffield M.D.   On: 01/03/2022 09:47  ? ?DG C-Arm 1-60 Min-No Report ? ?Result Date: 01/03/2022 ?Fluoroscopy was utilized by the requesting physician.  No radiographic interpretation.  ? ?DG C-Arm 1-60 Min-No Report ? ?Result Date: 01/03/2022 ?Fluoroscopy was utilized by the requesting physician.  No radiographic interpretation.   ? ?Antibiotics:  ?Anti-infectives (From admission, onward)  ? ? Start     Dose/Rate Route Frequency Ordered Stop  ? 01/05/22 0715  vancomycin (VANCOREADY) IVPB 1250 mg/250 mL       ? 1,250 mg ?166.7 mL/hr over 90 Minutes Intravenous To Surgery 01/05/22 0705 01/05/22 0842  ? 01/03/22 1900  vancomycin (VANCOCIN) IVPB 1000 mg/200 mL premix       ? 1,000 mg ?200 mL/hr over 60 Minutes Intravenous  Once 01/03/22 1230 01/03/22 1927  ? 01/03/22 0600  vancomycin (VANCOCIN) IVPB 1000 mg/200 mL premix       ? 1,000 mg ?200 mL/hr over 60 Minutes Intravenous On call to O.R. 01/03/22 1275 01/03/22 0816  ? ?  ? ? ?Discharge Exam: ?Blood pressure 132/84, pulse 60, temperature 97.8 ?F (36.6 ?C), temperature source Oral, resp. rate 12, height '5\' 5"'$  (1.651 m), weight  80.7 kg, SpO2 98 %. ?Neurologic: Grossly normal ?Ambulating and voiding well incision cdi  ? ?Discharge Medications:   ?Allergies as of 01/07/2022   ? ?   Reactions  ? Cephalosporins Anaphylaxis  ? Penicillins Anaphylaxis, Hives  ? Did it involve swelling of the face/tongue/throat, SOB, or low BP? Yes ?Did it involve sudden or severe rash/hives, skin peeling, or any reaction  on the inside of your mouth or nose? Yes ?Did you need to seek medical attention at a hospital or doctor's office? Yes ?When did it last happen?      10+ years ?If all above answers are ?NO?, may proceed with cephalosporin use.  ? Anesthetics, Amide Nausea And Vomiting  ? Does not know name of it. States they put it on record foot center.   ? Betadine [povidone Iodine] Other (See Comments)  ? "Skin Burn" caused scar on Left buttock  ? Latex Hives, Itching, Rash  ? Peach [prunus Persica] Hives  ? Claritin [loratadine]   ? Rash, pruritis  ? ?  ? ?  ?Medication List  ?  ? ?STOP taking these medications   ? ?DULoxetine 60 MG capsule ?Commonly known as: Cymbalta ?  ?predniSONE 20 MG tablet ?Commonly known as: DELTASONE ?  ?senna-docusate 8.6-50 MG tablet ?Commonly known as: Senokot-S ?  ? ?  ? ?TAKE these medications   ? ?acetaminophen 500 MG tablet ?Commonly known as: TYLENOL ?Take 1,000 mg by mouth every 6 (six) hours as needed for headache. ?  ?ALPRAZolam 0.5 MG tablet ?Commonly known as: Duanne Moron ?Take 1/2 to 1 tablet (0.25-0.5 mg total) by mouth at bedtime as needed for anxiety. ?What changed: how much to take ?  ?atorvastatin 20 MG tablet ?Commonly known as: LIPITOR ?TAKE 1 TABLET BY MOUTH ONCE DAILY ?  ?baclofen 10 MG tablet ?Commonly known as: LIORESAL ?TAKE 1 TABLET BY MOUTH THREE TIMES DAILY AS NEEDED ?  ?betamethasone dipropionate 0.05 % lotion ?Apply a small amount to affected area once a day ?  ?busPIRone 5 MG tablet ?Commonly known as: BUSPAR ?Take 1 tablet by mouth 2  times daily. ?What changed:  ?when to take this ?reasons to take this ?  ?carvedilol 25 MG tablet ?Commonly known as: COREG ?Take 2 tablets (50 mg total) by mouth 2 (two) times daily with a meal. ?What changed: how much to take ?  ?cholecalciferol 25 MCG (1000 UNIT) tablet ?Commonly known as: VITAMIN D3 ?Take 1,000 Units by mouth daily. ?  ?diphenhydrAMINE 25 MG tablet ?Commonly known as: BENADRYL ?Take 25 mg by mouth daily as needed for  itching. ?  ?Femring 0.05 MG/24HR Ring ?Generic drug: Estradiol Acetate ?USE ONE RING VAGINALLY EVERY 3 MONTHS AS DIRECTED ?  ?gabapentin 400 MG capsule ?Commonly known as: NEURONTIN ?Take 1 capsule by mouth three times daily. ?  ?meclizine 25 MG tablet ?Commonly known as: ANTIVERT ?Take 25 mg by mouth 3 (three) times daily as needed for dizziness. ?  ?methocarbamol 500 MG tablet ?Commonly known as: ROBAXIN ?TAKE 1 TABLET BY MOUTH AT BEDTIME AS NEEDED ?What changed: Another medication with the same name was added. Make sure you understand how and when to take each. ?  ?methocarbamol 500 MG tablet ?Commonly known as: Robaxin ?Take 1 tablet (500 mg total) by mouth 4 (four) times daily. ?What changed: You were already taking a medication with the same name, and this prescription was added. Make sure you understand how and when to take each. ?  ?neomycin-bacitracin-polymyxin ointment ?Commonly known as: NEOSPORIN ?Apply  1 application topically as needed for wound care. ?  ?oxyCODONE-acetaminophen 7.5-325 MG tablet ?Commonly known as: PERCOCET ?Take 1 tablet by mouth daily as needed for severe pain. ?  ?telmisartan 40 MG tablet ?Commonly known as: MICARDIS ?Take 1 tablet (40 mg total) by mouth daily. ?  ?traMADol 50 MG tablet ?Commonly known as: ULTRAM ?Take 1 tablet (50 mg total) by mouth every 6 (six) hours as needed. ?  ? ?  ? ? ?Disposition: home  ? ?Final Dx: acdf C4-5, 5-6 ? ? ? ? ? ? ?Signed: ?Ocie Cornfield [name] ?01/07/2022, 8:21 AM ?  ?

## 2022-01-07 NOTE — TOC Initial Note (Signed)
Transition of Care (TOC) - Initial/Assessment Note  ? ? ?Patient Details  ?Name: [name] ?MRN: [id number] ?Date of Birth: 08/25/1961 ? ?Transition of Care (TOC) CM/SW Contact:    ?Angelita Ingles, RN ?Phone Number:[phone] ? ?01/07/2022, 8:44 AM ? ?Clinical Narrative:                 ?TOC consulted for patient with [facility] recommendations. CM at bedside to offer patient choice for home health. [facility] referral sent and accepted by [facility] with Alvis Lemmings. No other needs noted at this time. AVS has been updated.  ? ?  ?  ? ? ?Patient Goals and CMS Choice ?  ?  ?  ? ?Expected Discharge Plan and Services ?  ?  ?  ?  ?  ?                ?  ?  ?  ?  ?  ?  ?  ?  ?  ?  ? ?Prior Living Arrangements/Services ?  ?  ?  ?       ?  ?  ?  ?  ? ?Activities of Daily Living ?  ?ADL Screening (condition at time of admission) ?Is the patient deaf or have difficulty hearing?: No ?Does the patient have difficulty seeing, even when wearing glasses/contacts?: No ?Does the patient have difficulty concentrating, remembering, or making decisions?: No ?Does the patient have difficulty dressing or bathing?: No ?Does the patient have difficulty walking or climbing stairs?: Yes ? ?Permission Sought/Granted ?  ?  ?   ?   ?   ?   ? ?Emotional Assessment ?  ?  ?  ?  ?  ?  ? ?Admission diagnosis:  S/P cervical spinal fusion [Z98.1] ?Cervical spondylosis with radiculopathy [M47.22] ?Patient Active Problem List  ? Diagnosis Date Noted  ? Cervical spondylosis with radiculopathy 01/05/2022  ? S/P cervical spinal fusion 01/03/2022  ? Cervical disc disorder with radiculopathy of cervical region 11/27/2021  ? Uncontrolled pain 05/27/2021  ? Syncope 05/13/2021  ? Depression with anxiety 05/13/2021  ? Chronic diastolic CHF (congestive heart failure) ([place]) 05/13/2021  ? Chronic back pain 05/13/2021  ? Chest tightness 05/13/2021  ? Lower abdominal pain 05/13/2021  ? Syncope and collapse 01/01/2021  ? Abnormal echocardiogram 01/01/2021  ? BPPV (benign  paroxysmal positional vertigo), left 12/26/2020  ? Odynophagia   ? Dysphagia 12/15/2020  ? Postural dizziness with presyncope 12/14/2020  ? Lumbar post-laminectomy syndrome   ? Chronic pain 09/28/2019  ? Lumbar spinal stenosis   ? Radicular pain   ? Gait disorder   ? Difficulty in urination 07/13/2018  ? Back pain 07/12/2018  ? Hyperlipidemia 04/16/2018  ? GAD (generalized anxiety disorder) 04/16/2018  ? AKI (acute kidney injury) ([place])   ? Benign essential HTN   ? Major depression, recurrent ([place])   ? Lumbar radiculopathy 03/09/2018  ? Paraparesis ([place]) 03/09/2018  ? Essential hypertension   ? Chronic nonintractable headache   ? Leukocytosis   ? Acute blood loss anemia   ? Postoperative pain   ? Generalized weakness   ? Spinal stenosis at L4-L5 level 03/04/2018  ? Spinal stenosis of lumbar region 12/28/2015  ? Chest pain 04/16/2015  ? ?PCP:  [name], [name], MD ?Pharmacy:   ?[facility] ?[address] ?[name] [address] ?Phone: [phone] Fax: [phone] ? ? ? ? ?Social Determinants of Health (SDOH) Interventions ?  ? ?Readmission Risk Interventions ?No flowsheet data found. ? ? ?

## 2022-01-08 ENCOUNTER — Encounter (HOSPITAL_COMMUNITY): Payer: Self-pay | Admitting: Neurological Surgery

## 2022-01-08 DIAGNOSIS — Z981 Arthrodesis status: Secondary | ICD-10-CM | POA: Diagnosis not present

## 2022-01-08 DIAGNOSIS — Z4789 Encounter for other orthopedic aftercare: Secondary | ICD-10-CM | POA: Diagnosis not present

## 2022-01-08 NOTE — Anesthesia Postprocedure Evaluation (Signed)
Anesthesia Post Note ? ?Patient: [name] ? ?Procedure(s) Performed: Anterior Cervical Decompression Fusion - Cervical four-Cervical five - Cervical five-Cervical six (Spine Cervical) ? ?  ? ?Patient location during evaluation: PACU ?Anesthesia Type: General ?Level of consciousness: awake and alert ?Pain management: pain level controlled ?Vital Signs Assessment: post-procedure vital signs reviewed and stable ?Respiratory status: spontaneous breathing, nonlabored ventilation, respiratory function stable and patient connected to nasal cannula oxygen ?Cardiovascular status: blood pressure returned to baseline and stable ?Postop Assessment: no apparent nausea or vomiting ?Anesthetic complications: no ? ? ?No notable events documented. ? ?Last Vitals:  ?Vitals:  ? 01/07/22 0359 01/07/22 0400  ?BP:    ?Pulse:    ?Resp:  12  ?Temp: 36.6 ?C   ?SpO2:    ?  ?Last Pain:  ?Vitals:  ? 01/07/22 0451  ?TempSrc:   ?PainSc: Asleep  ? ? ?  ?  ?  ?  ?  ?  ? ?[place] ? ? ? ? ?

## 2022-01-10 LAB — AEROBIC/ANAEROBIC CULTURE W GRAM STAIN (SURGICAL/DEEP WOUND)
Culture: NO GROWTH
Gram Stain: NONE SEEN

## 2022-01-14 ENCOUNTER — Encounter (HOSPITAL_COMMUNITY): Payer: Self-pay | Admitting: Neurological Surgery

## 2022-01-17 ENCOUNTER — Telehealth: Payer: Self-pay | Admitting: Pharmacist

## 2022-01-17 ENCOUNTER — Other Ambulatory Visit (HOSPITAL_COMMUNITY): Payer: Self-pay

## 2022-01-17 NOTE — Chronic Care Management (AMB) (Signed)
? ? ?Chronic Care Management ?Pharmacy Assistant  ? ?Name: [name]  MRN: [id number] DOB: [birth year] ? ?Reason for Encounter: Disease State ?  ?Conditions to be addressed/monitored: ?HTN ? ?Recent office visits:  ?None ? ?Recent consult visits:  ?01/05/22 - Patient presented for Emergency Cervical Wound Exploration.  ? ?Hospital visits:  ?Medication Reconciliation was completed by comparing discharge summary, patient?s EMR and Pharmacy list, and upon discussion with patient. ? ?Patient presented to [facility] on 01/03/22 due to S/P Cervical spinal fusion. Patient was present for 4 days ? ?New?Medications Started at [facility] Discharge:?? ?-started  ? ?Medication Changes at Hospital Discharge: ?-Changed  ?methocarbamol (ROBAXIN ? ?Medications Discontinued at Hospital Discharge: ?-Stopped  ?DULoxetine 60 MG capsule (Cymbalta) ?predniSONE 20 MG tablet (DELTASONE) ?senna-docusate 8.6-50 MG tablet (Senokot-S) ? ?Medications that remain the same after Hospital Discharge:??  ?-All other medications will remain the same.   ? ?Medications: ?Outpatient Encounter Medications as of 01/17/2022  ?Medication Sig Note  ? acetaminophen (TYLENOL) 500 MG tablet Take 1,000 mg by mouth every 6 (six) hours as needed for headache.   ? ALPRAZolam (XANAX) 0.5 MG tablet Take 1/2 to 1 tablet (0.25-0.5 mg total) by mouth at bedtime as needed for anxiety. (Patient taking differently: Take 0.5 mg by mouth at bedtime as needed for anxiety.)   ? atorvastatin (LIPITOR) 20 MG tablet TAKE 1 TABLET BY MOUTH ONCE DAILY   ? baclofen (LIORESAL) 10 MG tablet TAKE 1 TABLET BY MOUTH THREE TIMES DAILY AS NEEDED   ? betamethasone dipropionate 0.05 % lotion Apply a small amount to affected area once a day   ? busPIRone (BUSPAR) 5 MG tablet Take 1 tablet by mouth 2  times daily. (Patient taking differently: Take 5 mg by mouth 2 (two) times daily as needed (anxiety).) 12/25/2021: When pt runs out of xanax she'll take this as needed  ?  carvedilol (COREG) 25 MG tablet Take 2 tablets (50 mg total) by mouth 2 (two) times daily with a meal. (Patient taking differently: Take 25 mg by mouth 2 (two) times daily with a meal.) 12/25/2021: Pt has been taking 25 mg bid, didn't realize it was prescribed 50 mg bid until after we discussed it and she looked at her prescription bottle. I instructed the patient to contact her dr before changing anything and if the dr does want her to increase her dose to 50 bid that she should either contact us back at [facility] or notify staff at her procedure of the updated instructions.   ? cholecalciferol (VITAMIN D3) 25 MCG (1000 UNIT) tablet Take 1,000 Units by mouth daily.   ? diphenhydrAMINE (BENADRYL) 25 MG tablet Take 25 mg by mouth daily as needed for itching.   ? Estradiol Acetate (FEMRING) 0.05 MG/24HR RING USE ONE RING VAGINALLY EVERY 3 MONTHS AS DIRECTED   ? gabapentin (NEURONTIN) 400 MG capsule Take 1 capsule by mouth three times daily.   ? meclizine (ANTIVERT) 25 MG tablet Take 25 mg by mouth 3 (three) times daily as needed for dizziness.   ? methocarbamol (ROBAXIN) 500 MG tablet TAKE 1 TABLET BY MOUTH AT BEDTIME AS NEEDED   ? methocarbamol (ROBAXIN) 500 MG tablet Take 1 tablet by mouth 4 (four) times daily.   ? neomycin-bacitracin-polymyxin (NEOSPORIN) ointment Apply 1 application topically as needed for wound care.   ? oxyCODONE-acetaminophen (PERCOCET) 7.5-325 MG tablet Take 1 tablet by mouth daily as needed for severe pain.   ? telmisartan (MICARDIS) 40 MG tablet Take 1 tablet (  40 mg total) by mouth daily.   ? traMADol (ULTRAM) 50 MG tablet Take 1 tablet by mouth every 6 (six) hours as needed.   ? ?No facility-administered encounter medications on file as of 01/17/2022.  ?Reviewed chart prior to disease state call. Spoke with patient regarding BP ? ?Recent Office Vitals: ?BP Readings from Last 3 Encounters:  ?01/07/22 132/84  ?12/31/21 (!) 151/84  ?11/27/21 (!) 165/91  ? ?Pulse Readings from Last 3 Encounters:   ?01/07/22 60  ?12/31/21 73  ?11/27/21 77  ?  ?Wt Readings from Last 3 Encounters:  ?01/03/22 177 lb 14.6 oz (80.7 kg)  ?12/31/21 177 lb 3.2 oz (80.4 kg)  ?10/30/21 168 lb (76.2 kg)  ?  ? ?Kidney Function ?Lab Results  ?Component Value Date/Time  ? CREATININE 1.09 (H) 12/31/2021 09:31 AM  ? CREATININE 1.13 (H) 08/09/2021 09:43 AM  ? CREATININE 0.99 04/01/2019 03:35 PM  ? GFR 56.92 (L) 12/14/2020 10:10 AM  ? GFRNONAA 58 (L) 12/31/2021 09:31 AM  ? GFRAA >60 09/23/2019 12:07 PM  ? ? ? ?  Latest Ref Rng & Units 12/31/2021  ?  9:31 AM 08/09/2021  ?  9:43 AM 05/29/2021  ?  8:57 AM  ?BMP  ?Glucose 70 - 99 mg/dL 88   81     ?BUN 6 - 20 mg/dL 15   11     ?Creatinine 0.44 - 1.00 mg/dL 1.09   1.13   1.06    ?BUN/Creat Ratio 12 - 28  10     ?Sodium 135 - 145 mmol/L 139   143     ?Potassium 3.5 - 5.1 mmol/L 4.1   4.1     ?Chloride 98 - 111 mmol/L 107   104     ?CO2 22 - 32 mmol/L 25   24     ?Calcium 8.9 - 10.3 mg/dL 9.2   9.3     ? ? ?Current antihypertensive regimen:  ?Carvedilol 25 mg 2 tablets twice daily - Appropriate, Query effective, Query Safe, Accessible ?Telmisartan 40 mg 1 tablet daily - Appropriate, Query effective, Safe, Accessible ?How often are you checking your Blood Pressure? weekly ?Current home BP readings: Patient reports since surgery she hasn't felt up to checking ?What recent interventions/DTPs have been made by any provider to improve Blood Pressure control since last CPP Visit: None ?Any recent hospitalizations or ED visits since last visit with CPP? Yes ?Patient reports she has a follow up with her surgeon on yesterday and her pressure was 175/82 she reports in the hospital it was higher and they couldn't keep it down, she reports she has not felt up to checking at home since returning, she reports she isn't sleeping well, isn't really in pain that she thinks she just can not get comfortable and thinks that's making things worse. She reports she has been prescribed tramadol for sleep and that helps her  some, advised I would check back on her in a few weeks to see how she is progressing and for her to call me if she needs anything prior, she was in agreement. ? ?Adherence Review: ?Is the patient currently on ACE/ARB medication? Yes ?Does the patient have >5 day gap between last estimated fill dates? No ? ? ? ? ?Care Gaps: ?Zoster Vaccine - Overdue ?COVID Booster - Overdue ?BP- 132/84 ( 01/07/22) ?AWV- 11/22 ?CCM- Need in june ? ?Star Rating Drugs: ? ?Telmisartan 40 mg - Last filled 07/10/21 90 DS at [facility] (Verified) ?Atorvastatin (Lipitor) 20 mg -  Last filled 11/16/21 90 DS at [facility] ? ? ? ?Ned Clines CMA ?Clinical Pharmacist Assistant ?[phone] ? ?

## 2022-01-20 ENCOUNTER — Other Ambulatory Visit (HOSPITAL_COMMUNITY): Payer: Self-pay

## 2022-01-20 ENCOUNTER — Other Ambulatory Visit: Payer: Self-pay | Admitting: Family Medicine

## 2022-01-20 DIAGNOSIS — I700 Atherosclerosis of aorta: Secondary | ICD-10-CM | POA: Diagnosis not present

## 2022-01-20 DIAGNOSIS — G894 Chronic pain syndrome: Secondary | ICD-10-CM | POA: Diagnosis not present

## 2022-01-20 DIAGNOSIS — E785 Hyperlipidemia, unspecified: Secondary | ICD-10-CM | POA: Diagnosis not present

## 2022-01-20 DIAGNOSIS — M50122 Cervical disc disorder at C5-C6 level with radiculopathy: Secondary | ICD-10-CM | POA: Diagnosis not present

## 2022-01-20 DIAGNOSIS — M4722 Other spondylosis with radiculopathy, cervical region: Secondary | ICD-10-CM | POA: Diagnosis not present

## 2022-01-20 DIAGNOSIS — Z981 Arthrodesis status: Secondary | ICD-10-CM | POA: Diagnosis not present

## 2022-01-20 DIAGNOSIS — K219 Gastro-esophageal reflux disease without esophagitis: Secondary | ICD-10-CM | POA: Diagnosis not present

## 2022-01-20 DIAGNOSIS — Z9181 History of falling: Secondary | ICD-10-CM | POA: Diagnosis not present

## 2022-01-20 DIAGNOSIS — I119 Hypertensive heart disease without heart failure: Secondary | ICD-10-CM | POA: Diagnosis not present

## 2022-01-20 DIAGNOSIS — R911 Solitary pulmonary nodule: Secondary | ICD-10-CM | POA: Diagnosis not present

## 2022-01-20 DIAGNOSIS — Z4789 Encounter for other orthopedic aftercare: Secondary | ICD-10-CM | POA: Diagnosis not present

## 2022-01-20 DIAGNOSIS — M21371 Foot drop, right foot: Secondary | ICD-10-CM | POA: Diagnosis not present

## 2022-01-20 DIAGNOSIS — M4802 Spinal stenosis, cervical region: Secondary | ICD-10-CM | POA: Diagnosis not present

## 2022-01-20 DIAGNOSIS — M50121 Cervical disc disorder at C4-C5 level with radiculopathy: Secondary | ICD-10-CM | POA: Diagnosis not present

## 2022-01-20 DIAGNOSIS — F419 Anxiety disorder, unspecified: Secondary | ICD-10-CM | POA: Diagnosis not present

## 2022-01-20 MED ORDER — BACLOFEN 10 MG PO TABS
ORAL_TABLET | Freq: Three times a day (TID) | ORAL | 3 refills | Status: DC | PRN
  Filled 2022-01-20: qty 90, 30d supply, fill #0
  Filled 2022-07-12: qty 90, 30d supply, fill #1
  Filled 2023-01-19: qty 90, 30d supply, fill #2

## 2022-01-27 DIAGNOSIS — Z981 Arthrodesis status: Secondary | ICD-10-CM | POA: Diagnosis not present

## 2022-01-27 DIAGNOSIS — M50121 Cervical disc disorder at C4-C5 level with radiculopathy: Secondary | ICD-10-CM | POA: Diagnosis not present

## 2022-01-27 DIAGNOSIS — I700 Atherosclerosis of aorta: Secondary | ICD-10-CM | POA: Diagnosis not present

## 2022-01-27 DIAGNOSIS — F419 Anxiety disorder, unspecified: Secondary | ICD-10-CM | POA: Diagnosis not present

## 2022-01-27 DIAGNOSIS — M4802 Spinal stenosis, cervical region: Secondary | ICD-10-CM | POA: Diagnosis not present

## 2022-01-27 DIAGNOSIS — R911 Solitary pulmonary nodule: Secondary | ICD-10-CM | POA: Diagnosis not present

## 2022-01-27 DIAGNOSIS — M21371 Foot drop, right foot: Secondary | ICD-10-CM | POA: Diagnosis not present

## 2022-01-27 DIAGNOSIS — G894 Chronic pain syndrome: Secondary | ICD-10-CM | POA: Diagnosis not present

## 2022-01-27 DIAGNOSIS — Z4789 Encounter for other orthopedic aftercare: Secondary | ICD-10-CM | POA: Diagnosis not present

## 2022-01-27 DIAGNOSIS — I119 Hypertensive heart disease without heart failure: Secondary | ICD-10-CM | POA: Diagnosis not present

## 2022-01-27 DIAGNOSIS — M4722 Other spondylosis with radiculopathy, cervical region: Secondary | ICD-10-CM | POA: Diagnosis not present

## 2022-01-27 DIAGNOSIS — M50122 Cervical disc disorder at C5-C6 level with radiculopathy: Secondary | ICD-10-CM | POA: Diagnosis not present

## 2022-01-27 DIAGNOSIS — Z9181 History of falling: Secondary | ICD-10-CM | POA: Diagnosis not present

## 2022-01-27 DIAGNOSIS — E785 Hyperlipidemia, unspecified: Secondary | ICD-10-CM | POA: Diagnosis not present

## 2022-01-27 DIAGNOSIS — K219 Gastro-esophageal reflux disease without esophagitis: Secondary | ICD-10-CM | POA: Diagnosis not present

## 2022-01-29 ENCOUNTER — Ambulatory Visit: Payer: PPO | Admitting: Physical Medicine & Rehabilitation

## 2022-02-03 ENCOUNTER — Other Ambulatory Visit (HOSPITAL_COMMUNITY): Payer: Self-pay

## 2022-02-03 ENCOUNTER — Other Ambulatory Visit: Payer: Self-pay | Admitting: Family Medicine

## 2022-02-03 DIAGNOSIS — F419 Anxiety disorder, unspecified: Secondary | ICD-10-CM

## 2022-02-03 MED ORDER — ALPRAZOLAM 0.5 MG PO TABS
0.2500 mg | ORAL_TABLET | Freq: Every evening | ORAL | 2 refills | Status: DC | PRN
  Filled 2022-02-03: qty 30, 30d supply, fill #0
  Filled 2022-04-28: qty 30, 30d supply, fill #1
  Filled 2022-06-20: qty 30, 30d supply, fill #2

## 2022-02-03 NOTE — Telephone Encounter (Signed)
Patient called to get refill on ALPRAZolam [name]) 0.5 MG tablet ? ?She has one pill left ? ? ?Please send to ?[name] Phone:  [phone]  ?Fax:  [phone]  ?  ? ? ? ? ?Please advise  ?

## 2022-02-03 NOTE — Telephone Encounter (Signed)
I am sending Rx for Alprazolam but noted that she has received Rx for Diazepam and Lorazepam. These are same type of medications that Alprazolam and even thought we do not have a written med contract, she should not be receiving Rx for this medications. If she needs to take medication for tests in the future she can take Alprazolam. ?Thanks, ?BJ ?

## 2022-02-07 ENCOUNTER — Telehealth: Payer: Self-pay | Admitting: Pharmacist

## 2022-02-07 NOTE — Chronic Care Management (AMB) (Signed)
? ? ?Chronic Care Management ?Pharmacy Assistant  ? ?Name: [name]  MRN: [id number] DOB: 09/17/1961 ? ?Reason for Encounter: Disease State ?  ?Conditions to be addressed/monitored: ?HTN ? ?Recent office visits:  ?None ? ?Recent consult visits:  ?None ? ?Hospital visits:  ?Medication Reconciliation was completed by comparing discharge summary, patient?s EMR and Pharmacy list, and upon discussion with patient. ?  ?Patient presented to [facility] on 01/03/22 due to S/P Cervical spinal fusion. Patient was present for 4 days ?  ?New?Medications Started at [facility] Discharge:?? ?-started  ?  ?Medication Changes at Hospital Discharge: ?-Changed  ?methocarbamol (ROBAXIN ?  ?Medications Discontinued at Hospital Discharge: ?-Stopped  ?DULoxetine 60 MG capsule (Cymbalta) ?predniSONE 20 MG tablet (DELTASONE) ?senna-docusate 8.6-50 MG tablet (Senokot-S) ?  ?Medications that remain the same after Hospital Discharge:??  ?-All other medications will remain the same.   ? ?Medications: ?Outpatient Encounter Medications as of 02/07/2022  ?Medication Sig Note  ? acetaminophen (TYLENOL) 500 MG tablet Take 1,000 mg by mouth every 6 (six) hours as needed for headache.   ? ALPRAZolam (XANAX) 0.5 MG tablet Take 1/2 to 1 tablet (0.25-0.5 mg total) by mouth at bedtime as needed for anxiety.   ? atorvastatin (LIPITOR) 20 MG tablet TAKE 1 TABLET BY MOUTH ONCE DAILY   ? baclofen (LIORESAL) 10 MG tablet TAKE 1 TABLET BY MOUTH THREE TIMES DAILY AS NEEDED   ? betamethasone dipropionate 0.05 % lotion Apply a small amount to affected area once a day   ? busPIRone (BUSPAR) 5 MG tablet Take 1 tablet by mouth 2  times daily. (Patient taking differently: Take 5 mg by mouth 2 (two) times daily as needed (anxiety).) 12/25/2021: When pt runs out of xanax she'll take this as needed  ? carvedilol (COREG) 25 MG tablet Take 2 tablets (50 mg total) by mouth 2 (two) times daily with a meal. (Patient taking differently: Take 25 mg by  mouth 2 (two) times daily with a meal.) 12/25/2021: Pt has been taking 25 mg bid, didn't realize it was prescribed 50 mg bid until after we discussed it and she looked at her prescription bottle. I instructed the patient to contact her dr before changing anything and if the dr does want her to increase her dose to 50 bid that she should either contact us back at [facility] or notify staff at her procedure of the updated instructions.   ? cholecalciferol (VITAMIN D3) 25 MCG (1000 UNIT) tablet Take 1,000 Units by mouth daily.   ? diphenhydrAMINE (BENADRYL) 25 MG tablet Take 25 mg by mouth daily as needed for itching.   ? Estradiol Acetate (FEMRING) 0.05 MG/24HR RING USE ONE RING VAGINALLY EVERY 3 MONTHS AS DIRECTED   ? gabapentin (NEURONTIN) 400 MG capsule Take 1 capsule by mouth three times daily.   ? meclizine (ANTIVERT) 25 MG tablet Take 25 mg by mouth 3 (three) times daily as needed for dizziness.   ? methocarbamol (ROBAXIN) 500 MG tablet TAKE 1 TABLET BY MOUTH AT BEDTIME AS NEEDED   ? methocarbamol (ROBAXIN) 500 MG tablet Take 1 tablet by mouth 4 (four) times daily.   ? neomycin-bacitracin-polymyxin (NEOSPORIN) ointment Apply 1 application topically as needed for wound care.   ? oxyCODONE-acetaminophen (PERCOCET) 7.5-325 MG tablet Take 1 tablet by mouth daily as needed for severe pain.   ? telmisartan (MICARDIS) 40 MG tablet Take 1 tablet (40 mg total) by mouth daily.   ? traMADol (ULTRAM) 50 MG tablet Take 1 tablet by  mouth every 6 (six) hours as needed.   ? ?No facility-administered encounter medications on file as of 02/07/2022.  ?Reviewed chart prior to disease state call. Spoke with patient regarding BP ? ?Recent Office Vitals: ?BP Readings from Last 3 Encounters:  ?01/07/22 132/84  ?12/31/21 (!) 151/84  ?11/27/21 (!) 165/91  ? ?Pulse Readings from Last 3 Encounters:  ?01/07/22 60  ?12/31/21 73  ?11/27/21 77  ?  ?Wt Readings from Last 3 Encounters:  ?01/03/22 177 lb 14.6 oz (80.7 kg)  ?12/31/21 177 lb 3.2 oz (80.4  kg)  ?10/30/21 168 lb (76.2 kg)  ?  ? ?Kidney Function ?Lab Results  ?Component Value Date/Time  ? CREATININE 1.09 (H) 12/31/2021 09:31 AM  ? CREATININE 1.13 (H) 08/09/2021 09:43 AM  ? CREATININE 0.99 04/01/2019 03:35 PM  ? GFR 56.92 (L) 12/14/2020 10:10 AM  ? GFRNONAA 58 (L) 12/31/2021 09:31 AM  ? GFRAA >60 09/23/2019 12:07 PM  ? ? ? ?  Latest Ref Rng & Units 12/31/2021  ?  9:31 AM 08/09/2021  ?  9:43 AM 05/29/2021  ?  8:57 AM  ?BMP  ?Glucose 70 - 99 mg/dL 88   81     ?BUN 6 - 20 mg/dL 15   11     ?Creatinine 0.44 - 1.00 mg/dL 1.09   1.13   1.06    ?BUN/Creat Ratio 12 - 28  10     ?Sodium 135 - 145 mmol/L 139   143     ?Potassium 3.5 - 5.1 mmol/L 4.1   4.1     ?Chloride 98 - 111 mmol/L 107   104     ?CO2 22 - 32 mmol/L 25   24     ?Calcium 8.9 - 10.3 mg/dL 9.2   9.3     ? ? ?Current antihypertensive regimen:  ?Carvedilol 25 mg 2 tablets twice daily - Appropriate, Query effective, Query Safe, Accessible ?Telmisartan 40 mg 1 tablet daily - Appropriate, Query effective, Safe, Accessible ?How often are you checking your Blood Pressure?  ?Patient reports she does not have a blood pressure cuff available to her at home to use. She reports she has been getting in home therapy two times a week and they check when they come this is greater than 1 hour after she takes her medications. She reports it has been running on the higher end at one point bottom number was 100 ?Current home BP readings: Most recent 152/90 ?What recent interventions/DTPs have been made by any provider to improve Blood Pressure control since last CPP Visit: Patient reports no changes. She thinks it may be high as she still has some discomfort and pain in her neck where she had surgery. ?Any recent hospitalizations or ED visits since last visit with CPP? Yes ?What diet changes have been made to improve Blood Pressure Control?  ?Patient reports she is not drinking as much water as she should be and has been adding more water intake during the day. She  reports since surgery her appetite has decreased a bit she has stopped eating salty snacks and crackers with salt ?What exercise is being done to improve your Blood Pressure Control?  ?Patient reports she is doing therapy in home now and expects she will continue when discharged to outpatient PT. ?Patient reports she will work on getting a machine to monitor her blood pressures more closely and begin to log them taking a reading at least 1 hour after taking her medications. She confirms compliance with her  blood pressure medications. She has an appointment with her PCP in June and reports if it remains high she will call the office to have it moved up. ? ?Adherence Review: ?Is the patient currently on ACE/ARB medication? Yes ?Does the patient have >5 day gap between last estimated fill dates? Yes ? ? ? ? ?Care Gaps: ?Zoster Vaccine - Overdue ?COVID Booster - Overdue ?BP- 132/84 ( 01/07/22) ?AWV- 11/22 ?CCM- 5/23 ?  ?Star Rating Drugs: ?Telmisartan 40 mg - Last filled 07/10/21 90 DS at [facility] (Verified) ?Atorvastatin (Lipitor) 20 mg - Last filled 11/16/21 90 DS at [place] ? ? ? ? ?Ned Clines CMA ?Clinical Pharmacist Assistant ?[phone] ? ?

## 2022-02-18 DIAGNOSIS — M5412 Radiculopathy, cervical region: Secondary | ICD-10-CM | POA: Diagnosis not present

## 2022-03-04 ENCOUNTER — Other Ambulatory Visit (HOSPITAL_COMMUNITY): Payer: Self-pay

## 2022-03-04 DIAGNOSIS — Z113 Encounter for screening for infections with a predominantly sexual mode of transmission: Secondary | ICD-10-CM | POA: Diagnosis not present

## 2022-03-04 DIAGNOSIS — Z01419 Encounter for gynecological examination (general) (routine) without abnormal findings: Secondary | ICD-10-CM | POA: Diagnosis not present

## 2022-03-04 DIAGNOSIS — Z01411 Encounter for gynecological examination (general) (routine) with abnormal findings: Secondary | ICD-10-CM | POA: Diagnosis not present

## 2022-03-04 DIAGNOSIS — N951 Menopausal and female climacteric states: Secondary | ICD-10-CM | POA: Diagnosis not present

## 2022-03-04 DIAGNOSIS — Z0142 Encounter for cervical smear to confirm findings of recent normal smear following initial abnormal smear: Secondary | ICD-10-CM | POA: Diagnosis not present

## 2022-03-04 DIAGNOSIS — Z6828 Body mass index (BMI) 28.0-28.9, adult: Secondary | ICD-10-CM | POA: Diagnosis not present

## 2022-03-04 DIAGNOSIS — Z124 Encounter for screening for malignant neoplasm of cervix: Secondary | ICD-10-CM | POA: Diagnosis not present

## 2022-03-04 MED ORDER — FEMRING 0.05 MG/24HR VA RING
VAGINAL_RING | VAGINAL | 4 refills | Status: DC
  Filled 2022-03-04: qty 1, 84d supply, fill #0
  Filled 2022-05-30: qty 1, 84d supply, fill #1
  Filled 2022-09-08: qty 1, 84d supply, fill #2
  Filled 2022-11-12: qty 1, 84d supply, fill #3
  Filled 2023-02-20: qty 1, 84d supply, fill #4

## 2022-03-05 ENCOUNTER — Other Ambulatory Visit (HOSPITAL_COMMUNITY): Payer: Self-pay

## 2022-03-13 ENCOUNTER — Telehealth: Payer: Self-pay | Admitting: Pharmacist

## 2022-03-13 ENCOUNTER — Other Ambulatory Visit (HOSPITAL_COMMUNITY): Payer: Self-pay

## 2022-03-13 ENCOUNTER — Other Ambulatory Visit: Payer: Self-pay | Admitting: Family Medicine

## 2022-03-13 MED ORDER — GABAPENTIN 400 MG PO CAPS
ORAL_CAPSULE | ORAL | 3 refills | Status: DC
  Filled 2022-03-13: qty 90, 30d supply, fill #0
  Filled 2022-04-28: qty 90, 30d supply, fill #1
  Filled 2022-05-30: qty 90, 30d supply, fill #2
  Filled 2022-07-26: qty 90, 30d supply, fill #3

## 2022-03-13 NOTE — Chronic Care Management (AMB) (Signed)
    Chronic Care Management Pharmacy Assistant   Name: Adelle Zachar  MRN: [id number] DOB: 1960-12-28  03/14/22 APPOINTMENT REMINDER   Patient was reminded to have all medications, supplements and any blood glucose and blood pressure readings available for review with Jeni Salles, Pharm. D, for telephone visit on 03/14/22 at 12.    Care Gaps: Zoster Vaccine - Overdue COVID Booster - Overdue BP- 132/84 ( 01/07/22) AWV- 11/22 CCM- 5/23  Star Rating Drug: Telmisartan 40 mg - Last filled 07/10/21 90 DS at [facility] (Verified) Atorvastatin (Lipitor) 20 mg - Last filled 11/16/21 90 DS at [facility]  Any gaps in medications fill history? yes  Medications: Outpatient Encounter Medications as of 03/13/2022  Medication Sig Note   acetaminophen (TYLENOL) 500 MG tablet Take 1,000 mg by mouth every 6 (six) hours as needed for headache.    ALPRAZolam (XANAX) 0.5 MG tablet Take 1/2 to 1 tablet (0.25-0.5 mg total) by mouth at bedtime as needed for anxiety.    atorvastatin (LIPITOR) 20 MG tablet TAKE 1 TABLET BY MOUTH ONCE DAILY    baclofen (LIORESAL) 10 MG tablet TAKE 1 TABLET BY MOUTH THREE TIMES DAILY AS NEEDED    betamethasone dipropionate 0.05 % lotion Apply a small amount to affected area once a day    busPIRone (BUSPAR) 5 MG tablet Take 1 tablet by mouth 2  times daily. (Patient taking differently: Take 5 mg by mouth 2 (two) times daily as needed (anxiety).) 12/25/2021: When pt runs out of xanax she'll take this as needed   carvedilol (COREG) 25 MG tablet Take 2 tablets (50 mg total) by mouth 2 (two) times daily with a meal. (Patient taking differently: Take 25 mg by mouth 2 (two) times daily with a meal.) 12/25/2021: Pt has been taking 25 mg bid, didn't realize it was prescribed 50 mg bid until after we discussed it and she looked at her prescription bottle. I instructed the patient to contact her dr before changing anything and if the dr does want her to increase her dose to 50 bid  that she should either contact us back at [facility] or notify staff at her procedure of the updated instructions.    cholecalciferol (VITAMIN D3) 25 MCG (1000 UNIT) tablet Take 1,000 Units by mouth daily.    diphenhydrAMINE (BENADRYL) 25 MG tablet Take 25 mg by mouth daily as needed for itching.    Estradiol Acetate (FEMRING) 0.05 MG/24HR RING USE ONE RING VAGINALLY EVERY 3 MONTHS AS DIRECTED    gabapentin (NEURONTIN) 400 MG capsule Take 1 capsule by mouth three times daily.    meclizine (ANTIVERT) 25 MG tablet Take 25 mg by mouth 3 (three) times daily as needed for dizziness.    methocarbamol (ROBAXIN) 500 MG tablet TAKE 1 TABLET BY MOUTH AT BEDTIME AS NEEDED    methocarbamol (ROBAXIN) 500 MG tablet Take 1 tablet by mouth 4 (four) times daily.    neomycin-bacitracin-polymyxin (NEOSPORIN) ointment Apply 1 application topically as needed for wound care.    oxyCODONE-acetaminophen (PERCOCET) 7.5-325 MG tablet Take 1 tablet by mouth daily as needed for severe pain.    telmisartan (MICARDIS) 40 MG tablet Take 1 tablet (40 mg total) by mouth daily.    traMADol (ULTRAM) 50 MG tablet Take 1 tablet by mouth every 6 (six) hours as needed.    No facility-administered encounter medications on file as of 03/13/2022.       [place] Clinical Pharmacist Assistant [phone]

## 2022-03-14 ENCOUNTER — Telehealth: Payer: Self-pay

## 2022-03-14 ENCOUNTER — Ambulatory Visit (INDEPENDENT_AMBULATORY_CARE_PROVIDER_SITE_OTHER): Payer: PPO | Admitting: Pharmacist

## 2022-03-14 ENCOUNTER — Telehealth: Payer: Self-pay | Admitting: Internal Medicine

## 2022-03-14 DIAGNOSIS — I10 Essential (primary) hypertension: Secondary | ICD-10-CM

## 2022-03-14 DIAGNOSIS — F411 Generalized anxiety disorder: Secondary | ICD-10-CM

## 2022-03-14 NOTE — Progress Notes (Signed)
Chronic Care Management Pharmacy Note  03/26/2022 Name:  [name] MRN:  [id number] DOB:  06-01-1961  Summary: LDL not at goal < 100 Anxiety is not ideally controlled Carvedilol is above recommended dose  Recommendations/Changes made from today's visit: -Recommended for patient to restart taking buspirone daily as prescribed -Recommend repeat lipid panel and consider increasing statin intensity if LDL is not < 100 -Recommended bringing BP cuff to next office visit to ensure accuracy -Recommended taking the buspirone at the same time as the carvedilol to see if this helps with side effects  Plan: Follow up BP and anxiety assessment in 2 months after PCP visit  Subjective: [name] is an 61 y.o. year old female who is a primary patient of [name], Malka So, MD.  The CCM team was consulted for assistance with disease management and care coordination needs.    Engaged with patient by telephone for follow up visit in response to provider referral for pharmacy case management and/or care coordination services.   Consent to Services:  The patient was given information about Chronic Care Management services, agreed to services, and gave verbal consent prior to initiation of services.  Please see initial visit note for detailed documentation.   Patient Care Team: [name], [name], MD as PCP - General (Family Medicine) Debara Pickett Nadean Corwin, MD as PCP - General (Cardiology) Debara Pickett Nadean Corwin, MD as PCP - Cardiology (Cardiology) [name], MD as Consulting Physician (Orthopedic Surgery) Viona Gilmore, [facility] as Pharmacist (Pharmacist)  Recent office visits: 10/04/21 [name], [name], MD- Patient presented for acute shoulder pain. Prescribed celebrex twice daily x 7 days and prednisone taper.  09/12/21 Rolene Arbour, LPN: Patient presented for AWV.  08/21/21 [name], [name], MD- Patient presented for HTN and anxiety follow up. Prescribed buspirone 5 mg  BID.  Recent consult visits: 01/05/22 - Patient presented for Emergency Cervical Wound Exploration.   12/09/20 Guido Sander, PT (outpatient rehab): Patient presented for PT.  11/27/21 Meredith Staggers, MD (Physical Med) - Patient presented for Postoperative pain and other concerns. Prescribed prednisone taper. Referred to neurosurgery.   10/30/21 Hilty, Nadean Corwin, MD (Cardiology) - Patient presented for chest pain and dyspnea. No medication changes.  10/17/21 Suella Broad (PT) - Patient presented for opioid screening and other concerns. No medication changes. 08-01-2021 Guido Sander, PT (Rehab) - Patient presented for Other abnormalities of gait and mobility and other concerns. No medication changes.   07-31-2021 Meredith Staggers, MD (Phys Med) - Patient presented for Lumbar radiculopathy and other concerns. No medication changes.  Hospital visits: Medication Reconciliation was completed by comparing discharge summary, patient's EMR and Pharmacy list, and upon discussion with patient.   Patient presented to [facility] on 01/03/22 due to S/P Cervical spinal fusion. Patient was present for 4 days   New?Medications Started at [facility] Discharge:?? -started    Medication Changes at Hospital Discharge: -Changed  methocarbamol (ROBAXIN   Medications Discontinued at Hospital Discharge: -Stopped  DULoxetine 60 MG capsule (Cymbalta) predniSONE 20 MG tablet (DELTASONE) senna-docusate 8.6-50 MG tablet (Senokot-S)   Medications that remain the same after Hospital Discharge:??  -All other medications will remain the same.  Objective:  Lab Results  Component Value Date   CREATININE 1.09 (H) 12/31/2021   BUN 15 12/31/2021   GFR 56.92 (L) 12/14/2020   GFRNONAA 58 (L) 12/31/2021   GFRAA >60 09/23/2019   NA 139 12/31/2021   K 4.1 12/31/2021   CALCIUM 9.2 12/31/2021   CO2  25 12/31/2021   GLUCOSE 88 12/31/2021    Lab Results  Component Value Date/Time   HGBA1C 5.5  03/06/2018 03:26 AM   HGBA1C  06/01/2007 02:05 PM    5.4 (NOTE)   The ADA recommends the following therapeutic goals for glycemic   control related to Hgb A1C measurement:   Goal of Therapy:   < 7.0% Hgb A1C   Action Suggested:  > 8.0% Hgb A1C   Ref:  Diabetes Care, 22, Suppl. 1, 1999   GFR 56.92 (L) 12/14/2020 10:10 AM   GFR 61.59 03/28/2020 03:20 PM    Last diabetic Eye exam: No results found for: HMDIABEYEEXA  Last diabetic Foot exam: No results found for: HMDIABFOOTEX   Lab Results  Component Value Date   CHOL 198 04/20/2020   HDL 61.70 04/20/2020   LDLCALC 123 (H) 04/20/2020   TRIG 62.0 04/20/2020   CHOLHDL 3 04/20/2020       Latest Ref Rng & Units 05/13/2021    6:42 AM 04/20/2020    9:55 AM 07/21/2018    5:33 AM  Hepatic Function  Total Protein 6.5 - 8.1 g/dL 6.9   6.6   5.6    Albumin 3.5 - 5.0 g/dL 3.6   4.1   2.8    AST 15 - 41 U/L $Remo'20   16   25    'YCqYm$ ALT 0 - 44 U/L 17   19   47    Alk Phosphatase 38 - 126 U/L 52   70   50    Total Bilirubin 0.3 - 1.2 mg/dL 0.5   0.4   0.3    Bilirubin, Direct 0.0 - 0.3 mg/dL  0.1       Lab Results  Component Value Date/Time   TSH 3.361 05/13/2021 06:42 AM   TSH 3.111 12/14/2020 09:05 PM   TSH 1.80 04/01/2019 03:35 PM   TSH 5.749 Test methodology is 3rd generation TSH(H) 05/31/2007 10:30 PM   FREET4 0.87 05/13/2021 06:42 AM   FREET4 1.02 12/03/2017 11:00 AM       Latest Ref Rng & Units 12/31/2021    9:31 AM 05/29/2021    8:57 AM 05/14/2021    5:39 AM  CBC  WBC 4.0 - 10.5 K/uL 5.1   12.8   4.1    Hemoglobin 12.0 - 15.0 g/dL 13.1   11.1   10.8    Hematocrit 36.0 - 46.0 % 39.6   33.7   32.0    Platelets 150 - 400 K/uL 256   242   241      Lab Results  Component Value Date/Time   VD25OH 29.5 (L) 12/03/2017 11:00 AM    Clinical ASCVD: No  The 10-year ASCVD risk score (Arnett DK, et al., 2019) is: 6.9%   Values used to calculate the score:     Age: 61 years     Sex: Female     Is Non-Hispanic African American: Yes      Diabetic: No     Tobacco smoker: No     Systolic Blood Pressure: 568 mmHg     Is BP treated: Yes     HDL Cholesterol: 61.7 mg/dL     Total Cholesterol: 198 mg/dL       11/27/2021    9:50 AM 10/04/2021    8:35 AM 09/12/2021   11:26 AM  Depression screen PHQ 2/9  Decreased Interest 0 0 0  Down, Depressed, Hopeless 0 0 0  PHQ - 2 Score  0 0 0  Altered sleeping  0   Tired, decreased energy  0   Change in appetite  0   Feeling bad or failure about yourself   0   Trouble concentrating  0   Moving slowly or fidgety/restless  0   Suicidal thoughts  0   PHQ-9 Score  0       Social History   Tobacco Use  Smoking Status Never  Smokeless Tobacco Never   BP Readings from Last 3 Encounters:  01/07/22 132/84  12/31/21 (!) 151/84  11/27/21 (!) 165/91   Pulse Readings from Last 3 Encounters:  01/07/22 60  12/31/21 73  11/27/21 77   Wt Readings from Last 3 Encounters:  01/03/22 177 lb 14.6 oz (80.7 kg)  12/31/21 177 lb 3.2 oz (80.4 kg)  10/30/21 168 lb (76.2 kg)   BMI Readings from Last 3 Encounters:  01/03/22 29.61 kg/m  12/31/21 29.49 kg/m  11/27/21 27.96 kg/m    Assessment/Interventions: Review of patient past medical history, allergies, medications, health status, including review of consultants reports, laboratory and other test data, was performed as part of comprehensive evaluation and provision of chronic care management services.   SDOH:  (Social Determinants of Health) assessments and interventions performed: Yes   SDOH Screenings   Alcohol Screen: Not on file  Depression (PHQ2-9): Low Risk    PHQ-2 Score: 0  Financial Resource Strain: Low Risk    Difficulty of Paying Living Expenses: Not hard at all  Food Insecurity: No Food Insecurity   Worried About Charity fundraiser in the Last Year: Never true   Ran Out of Food in the Last Year: Never true  Housing: Low Risk    Last Housing Risk Score: 0  Physical Activity: Insufficiently Active   Days of Exercise  per Week: 2 days   Minutes of Exercise per Session: 40 min  Social Connections: Engineer, building services of Communication with Friends and Family: More than three times a week   Frequency of Social Gatherings with Friends and Family: Twice a week   Attends Religious Services: 1 to 4 times per year   Active Member of [employer] or Organizations: Yes   Attends Archivist Meetings: 1 to 4 times per year   Marital Status: Married  Stress: No Stress Concern Present   Feeling of Stress : Not at all  Tobacco Use: Low Risk    Smoking Tobacco Use: Never   Smokeless Tobacco Use: Never   Passive Exposure: Not on file  Transportation Needs: No Transportation Needs   Lack of Transportation (Medical): No   Lack of Transportation (Non-Medical): No    CCM Care Plan  Allergies  Allergen Reactions   Cephalosporins Anaphylaxis   Penicillins Anaphylaxis and Hives    Did it involve swelling of the face/tongue/throat, SOB, or low BP? Yes Did it involve sudden or severe rash/hives, skin peeling, or any reaction on the inside of your mouth or nose? Yes Did you need to seek medical attention at a hospital or doctor's office? Yes When did it last happen?      10+ years If all above answers are "NO", may proceed with cephalosporin use.     Anesthetics, Amide Nausea And Vomiting    Does not know name of it. States they put it on record foot center.    Betadine [Povidone Iodine] Other (See Comments)    "Skin Burn" caused scar on Left buttock   Latex Hives, Itching and  Rash   Peach [Prunus Persica] Hives   Claritin [Loratadine]     Rash, pruritis     Medications Reviewed Today     Reviewed by Verner Chol, [facility] (Pharmacist) on 03/14/22 at 1231  Med List Status: <None>   Medication Order Taking? Sig Documenting Provider Last Dose Status Informant  acetaminophen (TYLENOL) 500 MG tablet [id number]  Take 1,000 mg by mouth every 6 (six) hours as needed for headache. [provider]  Active Self  ALPRAZolam Prudy Feeler) 0.5 MG tablet [id number]  Take 1/2 to 1 tablet (0.25-0.5 mg total) by mouth at bedtime as needed for anxiety. [place], [name], MD  Active   atorvastatin (LIPITOR) 20 MG tablet [id number]  TAKE 1 TABLET BY MOUTH ONCE DAILY [place], [name], MD  Active Self  baclofen (LIORESAL) 10 MG tablet [id number]  TAKE 1 TABLET BY MOUTH THREE TIMES DAILY AS NEEDED [place], [name], MD  Active   betamethasone dipropionate 0.05 % lotion [id number]  Apply a small amount to affected area once a day   Active Self  busPIRone (BUSPAR) 5 MG tablet [id number]  Take 1 tablet by mouth 2  times daily.  Patient taking differently: Take 5 mg by mouth 2 (two) times daily as needed (anxiety).   [place], [name], MD  Active Self           Med Note Reinaldo Raddle, Finleigh Cheong G   Fri Mar 14, 2022 12:25 PM)    carvedilol (COREG) 25 MG tablet [id number] Yes Take 2 tablets (50 mg total) by mouth 2 (two) times daily with a meal. Chrystie Nose, MD Taking Active Self           Med Note Reinaldo Raddle, Shelley Pooley G   Fri Mar 14, 2022 12:12 PM)    cholecalciferol (VITAMIN D3) 25 MCG (1000 UNIT) tablet [id number]  Take 1,000 Units by mouth daily. [provider]  Active Self  diphenhydrAMINE (BENADRYL) 25 MG tablet [id number]  Take 25 mg by mouth daily as needed for itching. [provider]  Active Self  Estradiol Acetate (FEMRING) 0.05 MG/24HR RING [id number]  USE ONE RING VAGINALLY EVERY 3 MONTHS AS DIRECTED   Active   gabapentin (NEURONTIN) 400 MG capsule [id number]  Take 1 capsule by mouth three times daily. Sheran Luz, MD  Active   meclizine (ANTIVERT) 25 MG tablet [id number]  Take 25 mg by mouth 3 (three) times daily as needed for dizziness. [provider]  Active Self     Discontinued 03/14/22 1231 (Completed Course) methocarbamol (ROBAXIN) 500 MG tablet [id number]  Take 1 tablet by mouth 4 (four) times daily. Meyran, Tiana Loft, NP  Active   neomycin-bacitracin-polymyxin (NEOSPORIN)  ointment [id number]  Apply 1 application topically as needed for wound care. [provider]  Active Self  oxyCODONE-acetaminophen (PERCOCET) 7.5-325 MG tablet [id number]  Take 1 tablet by mouth daily as needed for severe pain. [provider]  Active Self  telmisartan (MICARDIS) 40 MG tablet [id number] Yes Take 1 tablet (40 mg total) by mouth daily. Chrystie Nose, MD Taking Active Self  traMADol (ULTRAM) 50 MG tablet [id number]  Take 1 tablet by mouth every 6 (six) hours as needed. Sherryl Manges, NP  Active             Patient Active Problem List   Diagnosis Date Noted   Cervical spondylosis with radiculopathy 01/05/2022   S/P cervical spinal fusion 01/03/2022   Cervical disc disorder with radiculopathy of cervical region 11/27/2021  Uncontrolled pain 05/27/2021   Syncope 05/13/2021   Depression with anxiety 05/13/2021   Chronic diastolic CHF (congestive heart failure) ([place]) 05/13/2021   Chronic back pain 05/13/2021   Chest tightness 05/13/2021   Lower abdominal pain 05/13/2021   Syncope and collapse 01/01/2021   Abnormal echocardiogram 01/01/2021   BPPV (benign paroxysmal positional vertigo), left 12/26/2020   Odynophagia    Dysphagia 12/15/2020   Postural dizziness with presyncope 12/14/2020   Lumbar post-laminectomy syndrome    Chronic pain 09/28/2019   Lumbar spinal stenosis    Radicular pain    Gait disorder    Difficulty in urination 07/13/2018   Back pain 07/12/2018   Hyperlipidemia 04/16/2018   GAD (generalized anxiety disorder) 04/16/2018   AKI (acute kidney injury) ([place])    Benign essential HTN    Major depression, recurrent ([place])    Lumbar radiculopathy 03/09/2018   Paraparesis ([place]) 03/09/2018   Essential hypertension    Chronic nonintractable headache    Leukocytosis    Acute blood loss anemia    Postoperative pain    Generalized weakness    Spinal stenosis at L4-L5 level 03/04/2018   Spinal stenosis of lumbar region  12/28/2015   Chest pain 04/16/2015    Immunization History  Administered Date(s) Administered   Influenza,inj,Quad PF,6+ Mos 08/03/2018, 08/09/2019, 08/20/2020, 07/04/2021   Influenza,inj,quad, With Preservative 07/27/2017   Influenza-Unspecified 08/07/2021   PFIZER(Purple Top)SARS-COV-2 Vaccination 12/14/2019, 01/05/2020, 07/24/2020, 02/15/2021   Td 10/28/2007   Tdap 10/27/2012, 06/09/2014   Unspecified SARS-COV-2 Vaccination 08/28/2021   Zoster Recombinat (Shingrix) 08/27/2021, 03/12/2022   Patient has her second shingles shot this week and she felt terrible for 2 days after the shot. She was thinking about going to urgent care. Patient reports her neck is in a lot less pain and this can take up to 12 weeks to fully heal.   Patient does have fried chicken twice a week. She is trying hard to give this up.   Patient reports her anxiety was much worse after the surgery so she started taking the buspirone regularly and felt like it made her feel funny. Previously, she was using very sparingly.  Patient took it with the methocarbamol and that made her feeling. Recommended taking the buspirone at the same time as the carvedilol to see if this helps with side effects.  Conditions to be addressed/monitored:  Hypertension, Hyperlipidemia, Depression, Anxiety, and Pain  Conditions addressed this visit: Anxiety, hypertension, hyperlipidemia  Care Plan : CCM Pharmacy Care Plan  Updates made by Viona Gilmore, [facility] since 03/26/2022 12:00 AM     Problem: Problem: Hypertension, Hyperlipidemia, Depression, Anxiety, and Pain      Long-Range Goal: Patient-Specific Goal   Start Date: 06/11/2021  Expected End Date: 06/11/2022  Recent Progress: On track  Priority: High  Note:   Current Barriers:  Unable to independently monitor therapeutic efficacy Unable to achieve control of cholesterol and blood pressure   Pharmacist Clinical Goal(s):  Patient will achieve adherence to monitoring  guidelines and medication adherence to achieve therapeutic efficacy achieve control of cholesterol as evidenced by next lipid panel  through collaboration with PharmD and provider.   Interventions: 1:1 collaboration with [name], [name], MD regarding development and update of comprehensive plan of care as evidenced by provider attestation and co-signature Inter-disciplinary care team collaboration (see longitudinal plan of care) Comprehensive medication review performed; medication list updated in electronic medical record  Hypertension (BP goal <130/80) -Uncontrolled -Current treatment: Carvedilol 25 mg 2 tablets twice daily -  Appropriate, Query effective, Query Safe, Accessible Telmisartan 40 mg 1 tablet daily - Appropriate, Query effective, Safe, Accessible -Medications previously tried: telmisartan-HCTZ -Current home readings: 140-160/80-90s, 160/80s (wrist cuff) -Current dietary habits: cut back on salt; uses more pepper and cinnamon; looks at package labels -Current exercise habits: not able to right now -Denies hypotensive/hypertensive symptoms -Educated on BP goals and benefits of medications for prevention of heart attack, stroke and kidney damage; Exercise goal of 150 minutes per week; Importance of home blood pressure monitoring; Proper BP monitoring technique; -Counseled to monitor BP at home 2-3 times a week, document, and provide log at future appointments -Counseled on diet and exercise extensively Recommended to continue current medication  Hyperlipidemia: (LDL goal < 100) -Uncontrolled -Current treatment: Atorvastatin 20 mg 1 tablet daily - Appropriate, Query effective, Safe, Accessible -Medications previously tried: none  -Current dietary patterns: used to eat out a lot; eating more salads, baked chicken and [place] but does have some fried fish or chicken; eating a mot of fruit and some vegetables -Current exercise habits: not able to -Educated on Cholesterol goals;   Benefits of statin for ASCVD risk reduction; Importance of limiting foods high in cholesterol; Exercise goal of 150 minutes per week; -Counseled on diet and exercise extensively Recommended repeat lipid panel and increasing statin if LDL still above goal.  Depression/Anxiety (Goal: minimize symptoms) -Not ideally controlled -Current treatment: Alprazolam 0.5 mg 1/2-1 tablet daily as needed - Appropriate, Effective, Safe, Accessible Duloxetine 60 mg 1 capsule daily - Appropriate, Query effective, Safe, Accessible Buspirone 5 mg 1 tablet twice daily - Appropriate, Query effective, Safe, Accessible -Medications previously tried/failed: clonazepam -PHQ9: 10 -GAD7: 12 -Educated on Benefits of medication for symptom control Benefits of cognitive-behavioral therapy with or without medication -Counseled on differences between duloxetine and alprazolam for anxiety. Recommended restarting taking buspirone every day as it does not work for as needed use.  Pain (Goal: minimize pain) -Not ideally controlled -Current treatment  Tamadol 50 mg as needed - Appropriate, Effective, Query Safe, Accessible Gabapentin 400 mg 1 capsule 3 times daily - Appropriate, Effective, Safe, Accessible Baclofen 10 mg 1 tablet three times daily as needed - Appropriate, Effective, Safe, Accessible Oxycodone-acetaminophen 7.5-325 mg 1 tablet as needed - Appropriate, Effective, Query Safe, Accessible -Medications previously tried: n/a  -Counseled on risk for sedation and separating these medications.  Health Maintenance -Vaccine gaps: Prevnar -Current therapy:  Femring every 3 months (plan to taper off the Femring)  Meclizine 25 mg as needed (not taken in a while - Vertigo) Benadryl 25 mg as needed Senokot-S 6.8-50 mg as needed -Educated on Cost vs benefit of each product must be carefully weighed by individual consumer -Patient is satisfied with current therapy and denies issues -Counseled on long term risks of  estrogen therapy and patient reports the plan is to stop it soon.  Patient Goals/Self-Care Activities Patient will:  - take medications as prescribed check blood pressure at least weekly, document, and provide at future appointments target a minimum of 150 minutes of moderate intensity exercise weekly  Follow Up Plan: The care management team will reach out to the patient again over the next 60 days.        Medication Assistance: None required.  Patient affirms current coverage meets needs.  Compliance/Adherence/Medication fill history: Care Gaps: COVID booster BP- 132/84 ( 01/07/22)  Star-Rating Drugs: Telmisartan 40 mg - Last filled 07/10/21 90 DS at [facility] (Verified) Atorvastatin (Lipitor) 20 mg - Last filled 11/16/21 90 DS at [facility]  Patient's preferred pharmacy is:  [facility] [name] [address] Phone: [phone] Fax: [phone]   Uses pill box? Yes - has an AM/PM medicine Pt endorses 99% compliance  We discussed: Current pharmacy is preferred with insurance plan and patient is satisfied with pharmacy services Patient decided to: Continue current medication management strategy  Care Plan and Follow Up Patient Decision:  Patient agrees to Care Plan and Follow-up.  Plan: The care management team will reach out to the patient again over the next 60 days.  Jeni Salles, PharmD, [place] Pharmacist [facility] [place]

## 2022-03-14 NOTE — Telephone Encounter (Signed)
Spoke with pt, aware CT scan is for pulmonary nodules seen on cardiac CTA done 6/22.

## 2022-03-14 NOTE — Telephone Encounter (Signed)
-----   Message from Viona Gilmore, [facility] sent at 03/13/2022  3:33 PM EDT ----- Regarding: CCM referral Hi,  Could you please put in a CCM referral for Ms. Koenig?  Thanks! Maddie

## 2022-03-14 NOTE — Telephone Encounter (Signed)
Patient would like to know why she has been referred for CT with GI. Please advise.

## 2022-03-19 ENCOUNTER — Ambulatory Visit
Admission: RE | Admit: 2022-03-19 | Discharge: 2022-03-19 | Disposition: A | Discharge status: Home / Self Care | Payer: PPO | Source: Ambulatory Visit | Attending: Internal Medicine | Admitting: Internal Medicine

## 2022-03-19 ENCOUNTER — Other Ambulatory Visit: Payer: Self-pay | Admitting: Internal Medicine

## 2022-03-19 DIAGNOSIS — R079 Chest pain, unspecified: Secondary | ICD-10-CM | POA: Diagnosis not present

## 2022-03-19 DIAGNOSIS — R911 Solitary pulmonary nodule: Secondary | ICD-10-CM

## 2022-03-19 DIAGNOSIS — R059 Cough, unspecified: Secondary | ICD-10-CM

## 2022-03-26 DIAGNOSIS — F32A Depression, unspecified: Secondary | ICD-10-CM

## 2022-03-26 DIAGNOSIS — F419 Anxiety disorder, unspecified: Secondary | ICD-10-CM

## 2022-03-26 DIAGNOSIS — I10 Essential (primary) hypertension: Secondary | ICD-10-CM

## 2022-03-26 DIAGNOSIS — E785 Hyperlipidemia, unspecified: Secondary | ICD-10-CM

## 2022-03-26 NOTE — Patient Instructions (Signed)
Hi [name],  It was great to catch up with you again! Keep on checking your blood pressures and make sure to bring your log and your cuff to your appointment with Dr. [name].  Please reach out to me if you have any questions or need anything before our follow up!  Best, Maddie  Jeni Salles, PharmD, [place] Pharmacist [place] [facility] [place]   Visit Information   Goals Addressed   None    Patient Care Plan: CCM Pharmacy Care Plan     Problem Identified: Problem: Hypertension, Hyperlipidemia, Depression, Anxiety, and Pain      Long-Range Goal: Patient-Specific Goal   Start Date: 06/11/2021  Expected End Date: 06/11/2022  Recent Progress: On track  Priority: High  Note:   Current Barriers:  Unable to independently monitor therapeutic efficacy Unable to achieve control of cholesterol and blood pressure   Pharmacist Clinical Goal(s):  Patient will achieve adherence to monitoring guidelines and medication adherence to achieve therapeutic efficacy achieve control of cholesterol as evidenced by next lipid panel  through collaboration with PharmD and provider.   Interventions: 1:1 collaboration with [name], [name], MD regarding development and update of comprehensive plan of care as evidenced by provider attestation and co-signature Inter-disciplinary care team collaboration (see longitudinal plan of care) Comprehensive medication review performed; medication list updated in electronic medical record  Hypertension (BP goal <130/80) -Uncontrolled -Current treatment: Carvedilol 25 mg 2 tablets twice daily - Appropriate, Query effective, Query Safe, Accessible Telmisartan 40 mg 1 tablet daily - Appropriate, Query effective, Safe, Accessible -Medications previously tried: telmisartan-HCTZ -Current home readings: 140-160/80-90s, 160/80s (wrist cuff) -Current dietary habits: cut back on salt; uses more pepper and cinnamon; looks at package  labels -Current exercise habits: not able to right now -Denies hypotensive/hypertensive symptoms -Educated on BP goals and benefits of medications for prevention of heart attack, stroke and kidney damage; Exercise goal of 150 minutes per week; Importance of home blood pressure monitoring; Proper BP monitoring technique; -Counseled to monitor BP at home 2-3 times a week, document, and provide log at future appointments -Counseled on diet and exercise extensively Recommended to continue current medication  Hyperlipidemia: (LDL goal < 100) -Uncontrolled -Current treatment: Atorvastatin 20 mg 1 tablet daily - Appropriate, Query effective, Safe, Accessible -Medications previously tried: none  -Current dietary patterns: used to eat out a lot; eating more salads, baked chicken and [place] but does have some fried fish or chicken; eating a mot of fruit and some vegetables -Current exercise habits: not able to -Educated on Cholesterol goals;  Benefits of statin for ASCVD risk reduction; Importance of limiting foods high in cholesterol; Exercise goal of 150 minutes per week; -Counseled on diet and exercise extensively Recommended repeat lipid panel and increasing statin if LDL still above goal.  Depression/Anxiety (Goal: minimize symptoms) -Not ideally controlled -Current treatment: Alprazolam 0.5 mg 1/2-1 tablet daily as needed - Appropriate, Effective, Safe, Accessible Duloxetine 60 mg 1 capsule daily - Appropriate, Query effective, Safe, Accessible Buspirone 5 mg 1 tablet twice daily - Appropriate, Query effective, Safe, Accessible -Medications previously tried/failed: clonazepam -PHQ9: 10 -GAD7: 12 -Educated on Benefits of medication for symptom control Benefits of cognitive-behavioral therapy with or without medication -Counseled on differences between duloxetine and alprazolam for anxiety. Recommended restarting taking buspirone every day as it does not work for as needed use.  Pain  (Goal: minimize pain) -Not ideally controlled -Current treatment  Tamadol 50 mg as needed - Appropriate, Effective, Query Safe, Accessible Gabapentin 400 mg 1 capsule  3 times daily - Appropriate, Effective, Safe, Accessible Baclofen 10 mg 1 tablet three times daily as needed - Appropriate, Effective, Safe, Accessible Oxycodone-acetaminophen 7.5-325 mg 1 tablet as needed - Appropriate, Effective, Query Safe, Accessible -Medications previously tried: n/a  -Counseled on risk for sedation and separating these medications.  Health Maintenance -Vaccine gaps: Prevnar -Current therapy:  Femring every 3 months (plan to taper off the Femring)  Meclizine 25 mg as needed (not taken in a while - Vertigo) Benadryl 25 mg as needed Senokot-S 6.8-50 mg as needed -Educated on Cost vs benefit of each product must be carefully weighed by individual consumer -Patient is satisfied with current therapy and denies issues -Counseled on long term risks of estrogen therapy and patient reports the plan is to stop it soon.  Patient Goals/Self-Care Activities Patient will:  - take medications as prescribed check blood pressure at least weekly, document, and provide at future appointments target a minimum of 150 minutes of moderate intensity exercise weekly  Follow Up Plan: The care management team will reach out to the patient again over the next 60 days.         Patient verbalizes understanding of instructions and care plan provided today and agrees to view in [place]. Active MyChart status and patient understanding of how to access instructions and care plan via MyChart confirmed with patient.    The pharmacy team will reach out to the patient again over the next 60 days.   Viona Gilmore, [facility]

## 2022-03-27 DIAGNOSIS — L668 Other cicatricial alopecia: Secondary | ICD-10-CM | POA: Diagnosis not present

## 2022-04-02 ENCOUNTER — Encounter: Payer: PPO | Admitting: Physical Medicine & Rehabilitation

## 2022-04-03 ENCOUNTER — Other Ambulatory Visit (HOSPITAL_COMMUNITY): Payer: Self-pay

## 2022-04-03 ENCOUNTER — Other Ambulatory Visit: Payer: Self-pay | Admitting: Family Medicine

## 2022-04-03 DIAGNOSIS — E785 Hyperlipidemia, unspecified: Secondary | ICD-10-CM

## 2022-04-04 ENCOUNTER — Ambulatory Visit: Payer: PPO | Admitting: Family Medicine

## 2022-04-04 ENCOUNTER — Other Ambulatory Visit (HOSPITAL_COMMUNITY): Payer: Self-pay

## 2022-04-04 MED ORDER — ATORVASTATIN CALCIUM 20 MG PO TABS
ORAL_TABLET | Freq: Every day | ORAL | 2 refills | Status: DC
  Filled 2022-04-04: qty 90, 90d supply, fill #0
  Filled 2022-07-12: qty 90, 90d supply, fill #1
  Filled 2022-10-08: qty 90, 90d supply, fill #2

## 2022-04-04 NOTE — Progress Notes (Unsigned)
HPI: [name] is a 61 y.o. female, who is here today for her routine physical.  Last CPE: 08/2019.  Regular exercise: She has not done so since cervical spine surgery. She completed PT recently and waiting to be cleared to resume pool exercises. Following a healthful diet: She has decreased carbs intake, eating more vegetables and protein.  Chronic medical problems: Chronic pain, HTN, HLD, and anxiety among some. Since her last visit she has undergone anterior cervical decompression and cervical wound exploration on 01/03/22 and 01/05/22 respectively.  Immunization History  Administered Date(s) Administered   Influenza,inj,Quad PF,6+ Mos 08/03/2018, 08/09/2019, 08/20/2020, 07/04/2021   Influenza,inj,quad, With Preservative 07/27/2017   Influenza-Unspecified 08/07/2021   PFIZER(Purple Top)SARS-COV-2 Vaccination 12/14/2019, 01/05/2020, 07/24/2020, 02/15/2021   Td 10/28/2007   Tdap 10/27/2012, 06/09/2014   Unspecified SARS-COV-2 Vaccination 08/28/2021   Zoster Recombinat (Shingrix) 08/27/2021, 03/12/2022   Health Maintenance  Topic Date Due   COVID-19 Vaccine (6 - Booster for Pfizer series) 10/23/2021   INFLUENZA VACCINE  05/27/2022   COLONOSCOPY (Pts 45-10yrs Insurance coverage will need to be confirmed)  09/04/2022   MAMMOGRAM  06/29/2023   TETANUS/TDAP  06/09/2024   Hepatitis C Screening  Completed   HIV Screening  Completed   Zoster Vaccines- Shingrix  Completed   HPV VACCINES  Aged Out   PAP SMEAR-Modifier  Discontinued   S/P hysterectomy due to fibroids. Colonoscopy done in 08/2017. Pathology of polyp removed showed tubular adenoma. Surgical [P], ascending, polyp - TUBULAR ADENOMA (1 OF 1 FRAGMENTS) - NO HIGH GRADE DYSPLASIA OR MALIGNANCY IDENTIFIED.  She has no new concerns today. Hyperlipidemia on atorvastatin 20 mg daily. Lab Results  Component Value Date   CHOL 198 04/20/2020   HDL 61.70 04/20/2020   LDLCALC 123 (H) 04/20/2020   TRIG 62.0  04/20/2020   CHOLHDL 3 04/20/2020   Hypertension: Currently she is on carvedilol 50 mg twice daily and telmisartan 40 mg daily. Following with cardiologist. Lab Results  Component Value Date   CREATININE 1.09 (H) 12/31/2021   BUN 15 12/31/2021   NA 139 12/31/2021   K 4.1 12/31/2021   CL 107 12/31/2021   CO2 25 12/31/2021   Anxiety: Currently she is on BuSpar 5 mg twice daily and alprazolam 0.5 mg daily at bedtime. In general probably has been stable, aggravated recently due to husband's health problems.He was hospitalized for bilateral PE and was in the ICU for a few days.He is back home and recovering well.    04/07/2022    8:38 AM 11/27/2021    9:50 AM 10/04/2021    8:35 AM 09/12/2021   11:26 AM 07/31/2021   10:09 AM  Depression screen PHQ 2/9  Decreased Interest 0 0 0 0 0  Down, Depressed, Hopeless 1 0 0 0 0  PHQ - 2 Score 1 0 0 0 0  Altered sleeping 1  0    Tired, decreased energy 1  0    Change in appetite 1  0    Feeling bad or failure about yourself  0  0    Trouble concentrating 0  0    Moving slowly or fidgety/restless 0  0    Suicidal thoughts 0  0    PHQ-9 Score 4  0    Difficult doing work/chores Somewhat difficult       Review of Systems  Constitutional:  Negative for appetite change and fever.  HENT:  Negative for hearing loss, mouth sores, sore throat and trouble swallowing.   Eyes:  Negative for redness and visual disturbance.  Respiratory:  Negative for cough, shortness of breath and wheezing.   Cardiovascular:  Negative for chest pain and leg swelling.  Gastrointestinal:  Negative for abdominal pain, nausea and vomiting.       No changes in bowel habits.  Endocrine: Negative for cold intolerance, heat intolerance, polydipsia, polyphagia and polyuria.  Genitourinary:  Negative for decreased urine volume, dysuria, hematuria, vaginal bleeding and vaginal discharge.  Musculoskeletal:  Positive for arthralgias, back pain and gait problem.  Skin:  Negative for  color change and rash.  Allergic/Immunologic: Negative for environmental allergies.  Neurological:  Negative for syncope, weakness and headaches.  Hematological:  Negative for adenopathy. Does not bruise/bleed easily.  Psychiatric/Behavioral:  Negative for confusion. The patient is nervous/anxious.   All other systems reviewed and are negative.  Current Outpatient Medications on File Prior to Visit  Medication Sig Dispense Refill   acetaminophen (TYLENOL) 500 MG tablet Take 1,000 mg by mouth every 6 (six) hours as needed for headache.     ALPRAZolam (XANAX) 0.5 MG tablet Take 1/2 to 1 tablet (0.25-0.5 mg total) by mouth at bedtime as needed for anxiety. 30 tablet 2   atorvastatin (LIPITOR) 20 MG tablet TAKE 1 TABLET BY MOUTH ONCE DAILY 90 tablet 2   baclofen (LIORESAL) 10 MG tablet TAKE 1 TABLET BY MOUTH THREE TIMES DAILY AS NEEDED 90 tablet 3   betamethasone dipropionate 0.05 % lotion Apply a small amount to affected area once a day 60 mL 11   busPIRone (BUSPAR) 5 MG tablet Take 1 tablet by mouth 2  times daily. (Patient taking differently: Take 5 mg by mouth 2 (two) times daily as needed (anxiety).) 180 tablet 1   carvedilol (COREG) 25 MG tablet Take 2 tablets (50 mg total) by mouth 2 (two) times daily with a meal. 360 tablet 3   cholecalciferol (VITAMIN D3) 25 MCG (1000 UNIT) tablet Take 1,000 Units by mouth daily.     diphenhydrAMINE (BENADRYL) 25 MG tablet Take 25 mg by mouth daily as needed for itching.     Estradiol Acetate (FEMRING) 0.05 MG/24HR RING USE ONE RING VAGINALLY EVERY 3 MONTHS AS DIRECTED 1 each 4   gabapentin (NEURONTIN) 400 MG capsule Take 1 capsule by mouth three times daily. 90 capsule 3   meclizine (ANTIVERT) 25 MG tablet Take 25 mg by mouth 3 (three) times daily as needed for dizziness.     methocarbamol (ROBAXIN) 500 MG tablet Take 1 tablet by mouth 4 (four) times daily. 45 tablet 0   neomycin-bacitracin-polymyxin (NEOSPORIN) ointment Apply 1 application topically as  needed for wound care.     oxyCODONE-acetaminophen (PERCOCET) 7.5-325 MG tablet Take 1 tablet by mouth daily as needed for severe pain.     telmisartan (MICARDIS) 40 MG tablet Take 1 tablet (40 mg total) by mouth daily. 90 tablet 3   traMADol (ULTRAM) 50 MG tablet Take 1 tablet by mouth every 6 (six) hours as needed. 30 tablet 2   No current facility-administered medications on file prior to visit.   Past Medical History:  Diagnosis Date   Anxiety    Aortic atherosclerosis (HCC) 04/16/2021   Atypical angina (HCC)    Back pain    related to spinal stenosis and disc problem, radiates down left buttocks to leg., weakness occ.   Chest pain    a. 03/2015 Cath: nl cors; b. 03/2021 Cor CTA: Ca2+ = 0. Nl Cors.   Chronic pain syndrome    Dyspnea  GERD (gastroesophageal reflux disease)    Grade I diastolic dysfunction    Headache    Hyperlipidemia    Hypertension    Lumbar post-laminectomy syndrome    LVH (left ventricular hypertrophy) 12/15/2020   a. 11/2020 Echo: EF 65-70%, no rwma, sev asymm LVH with IVSd 1.9 cm. No LVOT obs @ rest. Gr1 DD. Triv MR.   PONV (postoperative nausea and vomiting)    Pulmonary nodules    a. 03/2021 CT Chest: 46mm pulm nodules in bilat lower lobes. F/u 1 yr.   Right foot drop    Syncope    a. 11/2020 Zio: No significant arrhythmias.   Vaginal foreign object    "Uses Femring"   Past Surgical History:  Procedure Laterality Date   ABDOMINAL HYSTERECTOMY     ANTERIOR CERVICAL DECOMP/DISCECTOMY FUSION N/A 01/03/2022   Procedure: Anterior Cervical Decompression Fusion - Cervical four-Cervical five - Cervical five-Cervical six;  Surgeon: Eustace Moore, MD;  Location: [place];  Service: Neurosurgery;  Laterality: N/A;   BIOPSY  12/16/2020   Procedure: BIOPSY;  Surgeon: Ladene Artist, MD;  Location: [place];  Service: Endoscopy;;   CARDIAC CATHETERIZATION N/A 04/18/2015   Procedure: Left Heart Cath and Coronary Angiography;  Surgeon: [name], MD;  Location:  [place] [facility];  Service: Cardiovascular;  Laterality: N/A;   COLONOSCOPY W/ BIOPSIES AND POLYPECTOMY     ESOPHAGOGASTRODUODENOSCOPY (EGD) WITH PROPOFOL N/A 12/16/2020   Procedure: ESOPHAGOGASTRODUODENOSCOPY (EGD) WITH PROPOFOL;  Surgeon: Ladene Artist, MD;  Location: [facility] ENDOSCOPY;  Service: Endoscopy;  Laterality: N/A;   FOOT SURGERY Bilateral    [place] "bunion,bone spur, tendon" (1) -6'16, (1)-10'16   HEMATOMA EVACUATION N/A 01/05/2022   Procedure: Cervical Wound Exploration;  Surgeon: Ashok Pall, MD;  Location: [place];  Service: Neurosurgery;  Laterality: N/A;   IR EPIDUROGRAPHY  07/21/2018   LUMBAR LAMINECTOMY/DECOMPRESSION MICRODISCECTOMY Bilateral 12/28/2015   Procedure: MICRO LUMBAR DECOMPRESSION L4 - L5 BILATERALLY;  Surgeon: [name], MD;  Location: [facility];  Service: Orthopedics;  Laterality: Bilateral;   LUMBAR LAMINECTOMY/DECOMPRESSION MICRODISCECTOMY Bilateral 03/04/2018   Procedure: Revision of Microlumbar Decompression Bilateral Lumbar Four-Five;  Surgeon: [name], MD;  Location: [place];  Service: Orthopedics;  Laterality: Bilateral;  90 mins   SAVORY DILATION N/A 12/16/2020   Procedure: SAVORY DILATION;  Surgeon: Ladene Artist, MD;  Location[facility];  Service: Endoscopy;  Laterality: N/A;   SPINAL CORD STIMULATOR INSERTION N/A 09/28/2019   Procedure: THORACIC SPINAL CORD STIMULATOR INSERTION;  Surgeon: Melina Schools, MD;  Location: [place];  Service: Orthopedics;  Laterality: N/A;  2.5 hrs   SPINAL CORD STIMULATOR REMOVAL N/A 05/27/2021   Procedure: LUMBAR SPINAL CORD STIMULATOR REMOVAL;  Surgeon: Deetta Perla, MD;  Location: [facility];  Service: Neurosurgery;  Laterality: N/A;   TUBAL LIGATION     WISDOM TOOTH EXTRACTION     WOUND EXPLORATION N/A 03/04/2018   Procedure: EXPLORATION OF LUMBAR DECOMPRESSION WOUND;  Surgeon: [name], MD;  Location: [place];  Service: Orthopedics;  Laterality: N/A;   Allergies  Allergen Reactions   Cephalosporins  Anaphylaxis   Penicillins Anaphylaxis and Hives    Did it involve swelling of the face/tongue/throat, SOB, or low BP? Yes Did it involve sudden or severe rash/hives, skin peeling, or any reaction on the inside of your mouth or nose? Yes Did you need to seek medical attention at a hospital or doctor's office? Yes When did it last happen?      10+ years If all  above answers are "NO", may proceed with cephalosporin use.     Anesthetics, Amide Nausea And Vomiting    Does not know name of it. States they put it on record foot center.    Betadine [Povidone Iodine] Other (See Comments)    "Skin Burn" caused scar on Left buttock   Latex Hives, Itching and Rash   Peach [Prunus Persica] Hives   Claritin [Loratadine]     Rash, pruritis    Family History  Problem Relation Age of Onset   Heart attack Mother    Lung cancer Father    Cancer Father    Pancreatic cancer Sister    Breast cancer Sister 31   Throat cancer Brother    Multiple myeloma Sister    Breast cancer Sister        diagnosed in her 67's   Heart attack Sister    Stomach cancer Cousin    Colon cancer Neg Hx    Social History   Socioeconomic History   Marital status: Married    Spouse name: Nelida Gores   Number of children: 1   Years of education: Not on file   Highest education level: Not on file  Occupational History   Not on file  Tobacco Use   Smoking status: Never   Smokeless tobacco: Never  Vaping Use   Vaping Use: Never used  Substance and Sexual Activity   Alcohol use: No   Drug use: No   Sexual activity: Yes    Partners: Male    Birth control/protection: Post-menopausal  Other Topics Concern   Not on file  Social History Narrative   Not on file   Social Determinants of Health   Financial Resource Strain: Low Risk  (09/12/2021)   Overall Financial Resource Strain (CARDIA)    Difficulty of Paying Living Expenses: Not hard at all  Food Insecurity: No Food Insecurity (09/12/2021)   Hunger Vital  Sign    Worried About Running Out of Food in the Last Year: Never true    Ran Out of Food in the Last Year: Never true  Transportation Needs: No Transportation Needs (09/12/2021)   PRAPARE - Hydrologist (Medical): No    Lack of Transportation (Non-Medical): No  Physical Activity: Insufficiently Active (09/12/2021)   Exercise Vital Sign    Days of Exercise per Week: 2 days    Minutes of Exercise per Session: 40 min  Stress: No Stress Concern Present (09/12/2021)   [place]    Feeling of Stress : Not at all  Social Connections: [place] (09/12/2021)   Social Connection and Isolation Panel [NHANES]    Frequency of Communication with Friends and Family: More than three times a week    Frequency of Social Gatherings with Friends and Family: Twice a week    Attends Religious Services: 1 to 4 times per year    Active Member of [employer] or Organizations: Yes    Attends Archivist Meetings: 1 to 4 times per year    Marital Status: Married   Vitals:   04/07/22 0835  BP: 130/86  Pulse: 75  Resp: 16  Temp: 98.2 F (36.8 C)  SpO2: 96%   Body mass index is 27.86 kg/m.  Wt Readings from Last 3 Encounters:  04/07/22 170 lb (77.1 kg)  01/03/22 177 lb 14.6 oz (80.7 kg)  12/31/21 177 lb 3.2 oz (80.4 kg)   Physical Exam Vitals  and nursing note reviewed.  Constitutional:      General: She is not in acute distress.    Appearance: She is well-developed.  HENT:     Head: Normocephalic and atraumatic.     Right Ear: Hearing, tympanic membrane, ear canal and external ear normal.     Left Ear: Hearing, tympanic membrane, ear canal and external ear normal.     Mouth/Throat:     Mouth: Mucous membranes are moist.     Pharynx: Oropharynx is clear. Uvula midline.  Eyes:     Extraocular Movements: Extraocular movements intact.     Conjunctiva/sclera: Conjunctivae normal.      Pupils: Pupils are equal, round, and reactive to light.  Neck:     Thyroid: No thyromegaly.     Trachea: No tracheal deviation.  Cardiovascular:     Rate and Rhythm: Normal rate and regular rhythm.     Pulses:          Dorsalis pedis pulses are 2+ on the right side and 2+ on the left side.     Heart sounds: No murmur heard. Pulmonary:     Effort: Pulmonary effort is normal. No respiratory distress.     Breath sounds: Normal breath sounds.  Abdominal:     Palpations: Abdomen is soft. There is no mass.     Tenderness: There is no abdominal tenderness.  Musculoskeletal:     Comments: No signs of synovitis appreciated.  Lymphadenopathy:     Cervical: No cervical adenopathy.     Upper Body:     Right upper body: No supraclavicular adenopathy.     Left upper body: No supraclavicular adenopathy.  Skin:    General: Skin is warm.     Findings: No erythema or rash.  Neurological:     General: No focal deficit present.     Mental Status: She is alert and oriented to person, place, and time.     Cranial Nerves: No cranial nerve deficit.     Deep Tendon Reflexes:     Reflex Scores:      Bicep reflexes are 2+ on the right side and 2+ on the left side.    Comments: Gait unstable, assisted by a cane. Right foot drop with hard brace.  Psychiatric:     Comments: Well groomed, good eye contact.   ASSESSMENT AND PLAN:  Ms. Adaley Kiene was here today annual physical examination.  Orders Placed This Encounter  Procedures   Lipid panel   Comprehensive metabolic panel   Ambulatory referral to Gastroenterology   Lab Results  Component Value Date   CREATININE 1.15 04/07/2022   BUN 10 04/07/2022   NA 141 04/07/2022   K 4.0 04/07/2022   CL 101 04/07/2022   CO2 32 04/07/2022   Lab Results  Component Value Date   ALT 14 04/07/2022   AST 18 04/07/2022   ALKPHOS 70 04/07/2022   BILITOT 0.4 04/07/2022   Lab Results  Component Value Date   CHOL 187 04/07/2022   HDL 61.50  04/07/2022   LDLCALC 116 (H) 04/07/2022   TRIG 48.0 04/07/2022   CHOLHDL 3 04/07/2022   Routine general medical examination at a health care facility We discussed the importance of regular physical activity and healthy diet for prevention of chronic illness and/or complications. Preventive guidelines reviewed. Vaccination up to date. S/P hysterectomy. She will arrange her mammogram. Due for colonoscopy. Ca++ and vit D supplementation to continue. Next CPE in a year.  The 10-year ASCVD  risk score (Arnett DK, et al., 2019) is: 6.3%   Values used to calculate the score:     Age: 59 years     Sex: Female     Is Non-Hispanic African American: Yes     Diabetic: No     Tobacco smoker: No     Systolic Blood Pressure: 300 mmHg     Is BP treated: Yes     HDL Cholesterol: 61.5 mg/dL     Total Cholesterol: 187 mg/dL  Colon cancer screening -     Ambulatory referral to Gastroenterology  Essential hypertension BP adequately controlled. No changes in current management. Eye exam is current.  Hyperlipidemia Continue Atorvastatin 20 mg daily and low fat diet. Further recommendations according to FLP result.  GAD (generalized anxiety disorder) Exacerbated by recent husband illness but back to baselines. Continue Buspar and Alprazolam same dose. F/U in 6 months, before if needed.  Major depression, recurrent ([place]) Problem seems to have improved. Continue Buspar 5 mg bid.  Return in 6 months (on 10/07/2022) for HTN and anxiety.  Sharonda Llamas G. [name], MD  [facility]. [place] office.

## 2022-04-07 ENCOUNTER — Encounter: Payer: Self-pay | Admitting: Family Medicine

## 2022-04-07 ENCOUNTER — Ambulatory Visit (INDEPENDENT_AMBULATORY_CARE_PROVIDER_SITE_OTHER): Payer: PPO | Admitting: Family Medicine

## 2022-04-07 VITALS — BP 130/86 | HR 75 | Temp 98.2°F | Resp 16 | Ht 65.5 in | Wt 170.0 lb

## 2022-04-07 DIAGNOSIS — F411 Generalized anxiety disorder: Secondary | ICD-10-CM | POA: Diagnosis not present

## 2022-04-07 DIAGNOSIS — F330 Major depressive disorder, recurrent, mild: Secondary | ICD-10-CM | POA: Diagnosis not present

## 2022-04-07 DIAGNOSIS — E785 Hyperlipidemia, unspecified: Secondary | ICD-10-CM

## 2022-04-07 DIAGNOSIS — I10 Essential (primary) hypertension: Secondary | ICD-10-CM

## 2022-04-07 DIAGNOSIS — Z1211 Encounter for screening for malignant neoplasm of colon: Secondary | ICD-10-CM

## 2022-04-07 DIAGNOSIS — Z0000 Encounter for general adult medical examination without abnormal findings: Secondary | ICD-10-CM | POA: Diagnosis not present

## 2022-04-07 LAB — LIPID PANEL
Cholesterol: 187 mg/dL (ref 0–200)
HDL: 61.5 mg/dL (ref >39.00)
LDL Cholesterol: 116 mg/dL — ABNORMAL HIGH (ref 0–99)
NonHDL: 125.88
Total CHOL/HDL Ratio: 3
Triglycerides: 48 mg/dL (ref 0.0–149.0)
VLDL: 9.6 mg/dL (ref 0.0–40.0)

## 2022-04-07 LAB — COMPREHENSIVE METABOLIC PANEL
ALT: 14 U/L (ref 0–35)
AST: 18 U/L (ref 0–37)
Albumin: 4.1 g/dL (ref 3.5–5.2)
Alkaline Phosphatase: 70 U/L (ref 39–117)
BUN: 10 mg/dL (ref 6–23)
CO2: 32 mEq/L (ref 19–32)
Calcium: 9.2 mg/dL (ref 8.4–10.5)
Chloride: 101 mEq/L (ref 96–112)
Creatinine, Ser: 1.15 mg/dL (ref 0.40–1.20)
GFR: 51.72 mL/min — ABNORMAL LOW (ref >60.00)
Glucose, Bld: 92 mg/dL (ref 70–99)
Potassium: 4 mEq/L (ref 3.5–5.1)
Sodium: 141 mEq/L (ref 135–145)
Total Bilirubin: 0.4 mg/dL (ref 0.2–1.2)
Total Protein: 7.2 g/dL (ref 6.0–8.3)

## 2022-04-07 NOTE — Assessment & Plan Note (Signed)
Exacerbated by recent husband illness but back to baselines. Continue Buspar and Alprazolam same dose. F/U in 6 months, before if needed.

## 2022-04-07 NOTE — Assessment & Plan Note (Signed)
BP adequately controlled. No changes in current management. Eye exam is current. 

## 2022-04-07 NOTE — Assessment & Plan Note (Addendum)
Continue Atorvastatin 20 mg daily and low fat diet. Further recommendations according to FLP result. 

## 2022-04-07 NOTE — Patient Instructions (Addendum)
A few things to remember from today's visit:  Routine general medical examination at a health care facility  Essential hypertension - Plan: Comprehensive metabolic panel  Hyperlipidemia, unspecified hyperlipidemia type - Plan: Lipid panel, Comprehensive metabolic panel  Colon cancer screening - Plan: Ambulatory referral to Gastroenterology  If you need refills please call your pharmacy. Do not use My Chart to request refills or for acute issues that need immediate attention.   Please be sure medication list is accurate. If a new problem present, please set up appointment sooner than planned today.  Health Maintenance, Female Adopting a healthy lifestyle and getting preventive care are important in promoting health and wellness. Ask your health care provider about: The right schedule for you to have regular tests and exams. Things you can do on your own to prevent diseases and keep yourself healthy. What should I know about diet, weight, and exercise? Eat a healthy diet  Eat a diet that includes plenty of vegetables, fruits, low-fat dairy products, and lean protein. Do not eat a lot of foods that are high in solid fats, added sugars, or sodium. Maintain a healthy weight Body mass index (BMI) is used to identify weight problems. It estimates body fat based on height and weight. Your health care provider can help determine your BMI and help you achieve or maintain a healthy weight. Get regular exercise Get regular exercise. This is one of the most important things you can do for your health. Most adults should: Exercise for at least 150 minutes each week. The exercise should increase your heart rate and make you sweat (moderate-intensity exercise). Do strengthening exercises at least twice a week. This is in addition to the moderate-intensity exercise. Spend less time sitting. Even light physical activity can be beneficial. Watch cholesterol and blood lipids Have your blood tested for  lipids and cholesterol at 61 years of age, then have this test every 5 years. Have your cholesterol levels checked more often if: Your lipid or cholesterol levels are high. You are older than 61 years of age. You are at high risk for heart disease. What should I know about cancer screening? Depending on your health history and family history, you may need to have cancer screening at various ages. This may include screening for: Breast cancer. Cervical cancer. Colorectal cancer. Skin cancer. Lung cancer. What should I know about heart disease, diabetes, and high blood pressure? Blood pressure and heart disease High blood pressure causes heart disease and increases the risk of stroke. This is more likely to develop in people who have high blood pressure readings or are overweight. Have your blood pressure checked: Every 3-5 years if you are 3-61 years of age. Every year if you are 43 years old or older. Diabetes Have regular diabetes screenings. This checks your fasting blood sugar level. Have the screening done: Once every three years after age 74 if you are at a normal weight and have a low risk for diabetes. More often and at a younger age if you are overweight or have a high risk for diabetes. What should I know about preventing infection? Hepatitis B If you have a higher risk for hepatitis B, you should be screened for this virus. Talk with your health care provider to find out if you are at risk for hepatitis B infection. Hepatitis C Testing is recommended for: Everyone born from [birth year] through 1965. Anyone with known risk factors for hepatitis C. Sexually transmitted infections (STIs) Get screened for STIs, including gonorrhea and  chlamydia, if: You are sexually active and are younger than 61 years of age. You are older than 61 years of age and your health care provider tells you that you are at risk for this type of infection. Your sexual activity has changed since you were last  screened, and you are at increased risk for chlamydia or gonorrhea. Ask your health care provider if you are at risk. Ask your health care provider about whether you are at high risk for HIV. Your health care provider may recommend a prescription medicine to help prevent HIV infection. If you choose to take medicine to prevent HIV, you should first get tested for HIV. You should then be tested every 3 months for as long as you are taking the medicine. Pregnancy If you are about to stop having your period (premenopausal) and you may become pregnant, seek counseling before you get pregnant. Take 400 to 800 micrograms (mcg) of folic acid every day if you become pregnant. Ask for birth control (contraception) if you want to prevent pregnancy. Osteoporosis and menopause Osteoporosis is a disease in which the bones lose minerals and strength with aging. This can result in bone fractures. If you are 86 years old or older, or if you are at risk for osteoporosis and fractures, ask your health care provider if you should: Be screened for bone loss. Take a calcium or vitamin D supplement to lower your risk of fractures. Be given hormone replacement therapy (HRT) to treat symptoms of menopause. Follow these instructions at home: Alcohol use Do not drink alcohol if: Your health care provider tells you not to drink. You are pregnant, may be pregnant, or are planning to become pregnant. If you drink alcohol: Limit how much you have to: 0-1 drink a day. Know how much alcohol is in your drink. In the [place] one drink equals one 12 oz bottle of beer (355 mL), one 5 oz glass of wine (148 mL), or one 1 oz glass of hard liquor (44 mL). Lifestyle Do not use any products that contain nicotine or tobacco. These products include cigarettes, chewing tobacco, and vaping devices, such as e-cigarettes. If you need help quitting, ask your health care provider. Do not use street drugs. Do not share needles. Ask your health  care provider for help if you need support or information about quitting drugs. General instructions Schedule regular health, dental, and eye exams. Stay current with your vaccines. Tell your health care provider if: You often feel depressed. You have ever been abused or do not feel safe at home. Summary Adopting a healthy lifestyle and getting preventive care are important in promoting health and wellness. Follow your health care provider's instructions about healthy diet, exercising, and getting tested or screened for diseases. Follow your health care provider's instructions on monitoring your cholesterol and blood pressure. This information is not intended to replace advice given to you by your health care provider. Make sure you discuss any questions you have with your health care provider. Document Revised: 03/04/2021 Document Reviewed: 03/04/2021 Elsevier Patient Education  [place].

## 2022-04-07 NOTE — Assessment & Plan Note (Signed)
Problem seems to have improved. Continue Buspar 5 mg bid.

## 2022-04-15 ENCOUNTER — Other Ambulatory Visit (HOSPITAL_COMMUNITY): Payer: Self-pay

## 2022-04-16 ENCOUNTER — Other Ambulatory Visit (HOSPITAL_COMMUNITY): Payer: Self-pay

## 2022-04-28 ENCOUNTER — Other Ambulatory Visit (HOSPITAL_COMMUNITY): Payer: Self-pay

## 2022-04-28 MED ORDER — METHOCARBAMOL 500 MG PO TABS
ORAL_TABLET | ORAL | 1 refills | Status: DC
  Filled 2022-04-28: qty 30, 10d supply, fill #0
  Filled 2022-07-12: qty 30, 10d supply, fill #1

## 2022-05-05 ENCOUNTER — Telehealth: Payer: Self-pay | Admitting: Internal Medicine

## 2022-05-05 NOTE — Telephone Encounter (Signed)
Pt c/o of Chest Pain: STAT if CP now or developed within 24 hours  1. Are you having CP right now? Yes.   2. Are you experiencing any other symptoms (ex. SOB, nausea, vomiting, sweating)? Nauseous. Pain started in the center of her center then it went to the left side under her breast and done her left arm. At night when she tries to lay down it bothers her more, when she coughs, and when she takes a deep breath. She has pain in the back of her neck area.   3. How long have you been experiencing CP? A week.   4. Is your CP continuous or coming and going? continuous  5. Have you taken Nitroglycerin? No  She recently lost her sister, she is very stress out and anxious. ?

## 2022-05-05 NOTE — Telephone Encounter (Signed)
Pixie Casino, MD  You 2 hours ago (2:15 PM)    Thanks .. I agree with your recommendations     Spoke with patient and advised her of the above. Patient states that she feels better and is going to wait to go. Advised patient against waiting. Patient aware and verbalized understanding.

## 2022-05-05 NOTE — Telephone Encounter (Signed)
Received stat call into triage from patient who states that she has been having chest pain on and off for over a week. Patient reports that she recently lost her sister and has been very emotional and stressed. Patient states that the pain is in the center of her chest and radiates down her left arm, and up the left side of her neck. Patient states that this pain is better with rest and worse with exertion. Patient states that she has tried taking anti anxiety meds to help with this and has had no relief. Advised patient she should report to the ER to be evaluated. Patient does not wish to go to the ER- she would like appointment instead. Advised patient that active chest pain and her symptoms should be treated in the ER. Scheduled patient to see Laurann Montana, NP on 7/14- patient states that she will also think about going to [facility] ER for evaluation.   Will forward to MD to make aware.

## 2022-05-08 LAB — GLUCOSE, POCT (MANUAL RESULT ENTRY): POC Glucose: 117 mg/dl — AB (ref 70–99)

## 2022-05-09 ENCOUNTER — Encounter (HOSPITAL_BASED_OUTPATIENT_CLINIC_OR_DEPARTMENT_OTHER): Payer: Self-pay | Admitting: Family

## 2022-05-09 ENCOUNTER — Ambulatory Visit (HOSPITAL_BASED_OUTPATIENT_CLINIC_OR_DEPARTMENT_OTHER): Payer: PPO | Admitting: Family

## 2022-05-09 ENCOUNTER — Other Ambulatory Visit (HOSPITAL_COMMUNITY): Payer: Self-pay

## 2022-05-09 VITALS — BP 182/100 | HR 79 | Ht 65.5 in | Wt 168.8 lb

## 2022-05-09 DIAGNOSIS — I517 Cardiomegaly: Secondary | ICD-10-CM

## 2022-05-09 DIAGNOSIS — R079 Chest pain, unspecified: Secondary | ICD-10-CM

## 2022-05-09 DIAGNOSIS — I10 Essential (primary) hypertension: Secondary | ICD-10-CM

## 2022-05-09 MED ORDER — TELMISARTAN 80 MG PO TABS
80.0000 mg | ORAL_TABLET | Freq: Every day | ORAL | 3 refills | Status: DC
  Filled 2022-05-09: qty 90, 90d supply, fill #0

## 2022-05-09 NOTE — Progress Notes (Signed)
Office Visit    Patient Name: [name] Date of Encounter: 05/09/2022  PCP:  [name], Betty [name], [name]   [place]  Cardiologist:  Cheryl Casino, [name]  Advanced Practice Provider:  No care team member to display Electrophysiologist:  None      Chief Complaint    [name] is a 61 y.o. female  presents today for chest pain   Past Medical History    Past Medical History:  Diagnosis Date   Anxiety    Aortic atherosclerosis ([place]) 04/16/2021   Atypical angina ([place])    Back pain    related to spinal stenosis and disc problem, radiates down left buttocks to leg., weakness occ.   Chest pain    a. 03/2015 Cath: nl cors; b. 03/2021 Cor CTA: Ca2+ = 0. Nl Cors.   Chronic pain syndrome    Dyspnea    GERD (gastroesophageal reflux disease)    Grade I diastolic dysfunction    Headache    Hyperlipidemia    Hypertension    Lumbar post-laminectomy syndrome    LVH (left ventricular hypertrophy) 12/15/2020   a. 11/2020 Echo: EF 65-70%, no rwma, sev asymm LVH with IVSd 1.9 cm. No LVOT obs @ rest. Gr1 DD. Triv MR.   PONV (postoperative nausea and vomiting)    Pulmonary nodules    a. 03/2021 CT Chest: 40m pulm nodules in bilat lower lobes. F/u 1 yr.   Right foot drop    Syncope    a. 11/2020 Zio: No significant arrhythmias.   Vaginal foreign object    "Uses Femring"   Past Surgical History:  Procedure Laterality Date   ABDOMINAL HYSTERECTOMY     ANTERIOR CERVICAL DECOMP/DISCECTOMY FUSION N/A 01/03/2022   Procedure: Anterior Cervical Decompression Fusion - Cervical four-Cervical five - Cervical five-Cervical six;  Surgeon: Cheryl Moore [name];  [place] [facility]  Service: Neurosurgery;  Laterality: N/A;   BIOPSY  12/16/2020   Procedure: BIOPSY;  Surgeon: Cheryl Artist [name];  [place] [facility]  Service: Endoscopy;;   CARDIAC CATHETERIZATION N/A 04/18/2015   Procedure: Left Heart Cath and Coronary Angiography;  Surgeon: Cheryl Forward [name];   [name];  Service: Cardiovascular;  Laterality: N/A;   COLONOSCOPY W/ BIOPSIES AND POLYPECTOMY     ESOPHAGOGASTRODUODENOSCOPY (EGD) WITH PROPOFOL N/A 12/16/2020   Procedure: ESOPHAGOGASTRODUODENOSCOPY (EGD) WITH PROPOFOL;  Surgeon: Cheryl Artist [name];  [facility];  Service: Endoscopy;  Laterality: N/A;   FOOT SURGERY Bilateral    TJackson"bunion,bone spur, tendon" (1) -6'16, (1)-10'16   HEMATOMA EVACUATION N/A 01/05/2022   Procedure: Cervical Wound Exploration;  Surgeon: Cheryl Pall [name];  [place]: [facility]  Service: Neurosurgery;  Laterality: N/A;   IR EPIDUROGRAPHY  07/21/2018   LUMBAR LAMINECTOMY/DECOMPRESSION MICRODISCECTOMY Bilateral 12/28/2015   Procedure: MICRO LUMBAR DECOMPRESSION L4 - L5 BILATERALLY;  Surgeon: Cheryl Day [name];  [facility];  Service: Orthopedics;  Laterality: Bilateral;   LUMBAR LAMINECTOMY/DECOMPRESSION MICRODISCECTOMY Bilateral 03/04/2018   Procedure: Revision of Microlumbar Decompression Bilateral Lumbar Four-Five;  Surgeon: BSusa Day [name];  [place]  Service: Orthopedics;  Laterality: Bilateral;  90 mins   SAVORY DILATION N/A 12/16/2020   Procedure: SAVORY DILATION;  Surgeon: Cheryl Artist [name];  [facility];  Service: Endoscopy;  Laterality: N/A;   SPINAL CORD STIMULATOR INSERTION N/A 09/28/2019   Procedure: THORACIC SPINAL CORD STIMULATOR INSERTION;  Surgeon: [name];  [place]  Service: Orthopedics;  Laterality: N/A;  2.5 hrs   SPINAL CORD STIMULATOR REMOVAL N/A 05/27/2021   Procedure: LUMBAR SPINAL CORD STIMULATOR REMOVAL;  Surgeon: [name];  Location: [facility];  Service: Neurosurgery;  Laterality: N/A;   TUBAL LIGATION     WISDOM TOOTH EXTRACTION     WOUND EXPLORATION N/A 03/04/2018   Procedure: EXPLORATION OF LUMBAR DECOMPRESSION WOUND;  Surgeon: [name], [name];  Location: [place];  Service: Orthopedics;  Laterality: N/A;   Allergies  Allergies  Allergen  Reactions   Cephalosporins Anaphylaxis   Penicillins Anaphylaxis and Hives    Did it involve swelling of the face/tongue/throat, SOB, or low BP? Yes Did it involve sudden or severe rash/hives, skin peeling, or any reaction on the inside of your mouth or nose? Yes Did you need to seek medical attention at a hospital or doctor's office? Yes When did it last happen?      10+ years If all above answers are "NO", may proceed with cephalosporin use.     Anesthetics, Amide Nausea And Vomiting    Does not know name of it. States they put it on record foot center.    Betadine [Povidone Iodine] Other (See Comments)    "Skin Burn" caused scar on Left buttock   Latex Hives, Itching and Rash   Peach [Prunus Persica] Hives   Claritin [Loratadine]     Rash, pruritis     History of Present Illness    [name] is a 61 y.o. female with a hx of HTN, HLD, lumbar back pain, anxiety last seen 10/30/21.  Prior cardiac cath 2016 with no coronary disease. 03/2021 cardiac CT With coronary calcium score of 0. 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  Presents today after contacting office noting chest pain. She lost her sister who was more like a mother recently after a massive stroke. Her sister had stroke and was transferred to her daughters house as she was total care with inability to speak, move. Chest pain occurred in setting of stress at rest or with activity. Recalls an episode immediately prior to the funeral starting. Has had difficulty sleeping. Has not had much of an appetite. Monitors BP at home. BP prior to sister passing away often 150s. Since her sister passed it has stayed high in the 170s-180s.  EKGs/Labs/Other Studies Reviewed:    The following studies were reviewed today:  EKG:  EKG is ordered today.  The ekg ordered today demonstrates NSR 79 bpm with no acute ST/T wave changes.   Recent Labs: 05/13/2021: B Natriuretic Peptide 12.1; Magnesium 1.9; TSH 3.361 12/31/2021: Hemoglobin 13.1; Platelets 256 04/07/2022: ALT 14; BUN 10; Creatinine, Ser 1.15; Potassium 4.0; Sodium 141  Recent Lipid Panel    Component Value Date/Time   CHOL 187 04/07/2022 0906   TRIG 48.0 04/07/2022 0906   HDL 61.50 04/07/2022 0906   CHOLHDL 3 04/07/2022 0906   VLDL 9.6 04/07/2022 0906   LDLCALC 116 (H) 04/07/2022 0906     Home Medications   Current Meds  Medication Sig   acetaminophen (TYLENOL) 500 MG tablet Take 1,000 mg by mouth every 6 (six) hours as needed for headache.   ALPRAZolam (XANAX) 0.5 MG tablet Take 1/2 to 1 tablet (0.25-0.5 mg total) by mouth at bedtime as needed for anxiety.  atorvastatin (LIPITOR) 20 MG tablet TAKE 1 TABLET BY MOUTH ONCE DAILY   baclofen (LIORESAL) 10 MG tablet TAKE 1 TABLET BY MOUTH THREE TIMES DAILY AS NEEDED   betamethasone dipropionate 0.05 % lotion Apply a small amount to affected area once a [name]   busPIRone (BUSPAR) 5 MG tablet Take 1 tablet by mouth 2  times daily. (Patient taking differently: Take 5 mg by mouth 2 (two) times daily as needed (anxiety).)   carvedilol (COREG) 25 MG tablet Take 2 tablets (50 mg total) by mouth 2 (two) times daily with a meal.   cholecalciferol (VITAMIN D3) 25 MCG (1000 UNIT) tablet Take 1,000 Units by mouth daily.   diphenhydrAMINE (BENADRYL) 25 MG tablet Take 25 mg by mouth daily as needed for itching.   Estradiol Acetate (FEMRING) 0.05 MG/24HR RING USE ONE RING VAGINALLY EVERY 3 MONTHS AS DIRECTED   gabapentin (NEURONTIN) 400 MG capsule Take 1 capsule by mouth three times daily.   meclizine (ANTIVERT) 25 MG tablet Take 25 mg by mouth 3 (three) times daily as needed for dizziness.   methocarbamol (ROBAXIN) 500 MG tablet Take 1 tablet by mouth 3 times every [name]  as needed for muscle spasms   neomycin-bacitracin-polymyxin (NEOSPORIN) ointment Apply 1 application topically as needed for wound care.   oxyCODONE-acetaminophen (PERCOCET) 7.5-325 MG tablet Take 1 tablet by mouth daily as needed for severe pain.   telmisartan (MICARDIS) 40 MG tablet Take 1 tablet (40 mg total) by mouth daily.   traMADol (ULTRAM) 50 MG tablet Take 1 tablet by mouth every 6 (six) hours as needed.   [DISCONTINUED] methocarbamol (ROBAXIN) 500 MG tablet Take 1 tablet by mouth 4 (four) times daily.     Review of Systems      All other systems reviewed and are otherwise negative except as noted above.  Physical Exam    VS:  BP (!) 182/100 (BP Location: Left Arm, Patient Position: Sitting, Cuff Size: Large)   Pulse 79   Ht 5' 5.5" (1.664 m)   Wt 168 lb 12.8 oz (76.6 kg)   BMI 27.66 kg/m  , BMI Body mass index is 27.66 kg/m.  Wt Readings from Last 3 Encounters:  05/09/22 168 lb 12.8 oz (76.6 kg)  04/07/22 170 lb (77.1 kg)  01/03/22 177 lb 14.6 oz (80.7 kg)     GEN: Well nourished, well developed, in no acute distress. HEENT: normal. Neck: Supple, no JVD, carotid bruits, or masses. Cardiac: RRR, no murmurs, rubs, or gallops. No clubbing, cyanosis, edema.  Radials/PT 2+ and equal bilaterally.  Respiratory:  Respirations regular and unlabored, clear to auscultation bilaterally. GI: Soft, nontender, nondistended. MS: No deformity or atrophy. Skin: Warm and dry, no rash. Neuro:  Strength and sensation are intact. Psych: Normal affect. Tearful.   Assessment & Plan    Chest pain in adult - 03/2021 coronary calcium score 0. Cardiac MRI with no evidence of HCM. Recent chest pain in setting of losing her sister. Episode immediately prior to funeral. Reassurance provided that EKG no acute changes, atypical for angina. No indication for ischemic evaluation.   Grief - Recently loss her sister who was more like a mother to her. Condolences offered. Good support of family and  church members. Offered referral to psychology which she prefers to think about.   HTN / LVH - BP not at goal. Increase Telmisartan to '80mg'$  QD. Continue Coreg '50mg'$  BID. BMP in 1 week. Consider Amlodipine at follow up if additional control needed. Likely higher in setting  of stress and grief. Consider secondary hypertension workup at follow up.   Disposition: Follow up in 3 month(s) with Cheryl Casino, [name] or APP.  Signed, [name], NP 05/09/2022, 3:18 PM [place]

## 2022-05-09 NOTE — Patient Instructions (Addendum)
Medication Instructions:  Your physician has recommended you make the following change in your medication:   CONTINUE Carvedilol '50mg'$  twice daily  INCREASE Telmisartan to '80mg'$  daily   *If you need a refill on your cardiac medications before your next appointment, please call your pharmacy*   Lab Work: Your physician recommends that you return for lab work in 1 week for BMP.  Please return for Lab work. You may come to the...   [facility] (3rd floor) [address], [place], [place] [address]  Open: 8am-Noon and 1pm-4:30pm  Please ring the doorbell on the small table when you exit the elevator and the Lab Tech will come get you  [place] [facility] [address] [place], [place], [place] [address] Open: 8am-1pm, then 2pm-4:30pm   [place]- Please see attached locations sheet stapled to your lab work with address and hours.    If you have labs (blood work) drawn today and your tests are completely normal, you will receive your results only by: [place] (if you have MyChart) OR A paper copy in the mail If you have any lab test that is abnormal or we need to change your treatment, we will call you to review the results.   Testing/Procedures: Your EKG today shows normal sinus rhythm with no acute ST/T wave changes.   Follow-Up: At [facility], you and your health needs are our priority.  As part of our continuing mission to provide you with exceptional heart care, we have created designated Provider Care Teams.  These Care Teams include your primary Cardiologist (physician) and Advanced Practice Providers (APPs -  Physician Assistants and Nurse Practitioners) who all work together to provide you with the care you need, when you need it.  We recommend signing up for the patient portal called "MyChart".  Sign up information is provided on this After Visit Summary.  MyChart is used to connect with patients for Virtual Visits  (Telemedicine).  Patients are able to view lab/test results, encounter notes, upcoming appointments, etc.  Non-urgent messages can be sent to your provider as well.   To learn more about what you can do with MyChart, go to [email or web address].    Your next appointment:   3 week(s)  The format for your next appointment:   In Person  Provider:   Laurann Montana, NP= or Dr. Debara Pickett   Other Instructions  Tips to Measure your Blood Pressure Correctly   Here's what you can do to ensure a correct reading:  Don't drink a caffeinated beverage or smoke during the 30 minutes before the test.  Sit quietly for five minutes before the test begins.  During the measurement, sit in a chair with your feet on the floor and your arm supported so your elbow is at about heart level.  The inflatable part of the cuff should completely cover at least 80% of your upper arm, and the cuff should be placed on bare skin, not over a shirt.  Don't talk during the measurement.   Blood pressure categories  Blood pressure category SYSTOLIC (upper number)  DIASTOLIC (lower number)  Normal Less than 120 mm Hg and Less than 80 mm Hg  Elevated 120-129 mm Hg and Less than 80 mm Hg  High blood pressure: Stage 1 hypertension 130-139 mm Hg or 80-89 mm Hg  High blood pressure: Stage 2 hypertension 140 mm Hg or higher or 90 mm Hg or higher  Hypertensive crisis (consult your doctor immediately) Higher than  180 mm Hg and/or Higher than 120 mm Hg  Source: American Heart Association and American Stroke Association. For more on getting your blood pressure under control, buy Controlling Your Blood Pressure, a Special Health Report from [facility].   Blood Pressure Log   Date   Time  Blood Pressure  Example: Nov 1 9 AM 124/78

## 2022-05-14 ENCOUNTER — Other Ambulatory Visit (HOSPITAL_COMMUNITY): Payer: Self-pay

## 2022-05-16 ENCOUNTER — Telehealth (HOSPITAL_BASED_OUTPATIENT_CLINIC_OR_DEPARTMENT_OTHER): Payer: Self-pay

## 2022-05-16 ENCOUNTER — Other Ambulatory Visit (HOSPITAL_COMMUNITY): Payer: Self-pay

## 2022-05-16 DIAGNOSIS — I10 Essential (primary) hypertension: Secondary | ICD-10-CM | POA: Diagnosis not present

## 2022-05-16 LAB — BASIC METABOLIC PANEL
BUN/Creatinine Ratio: 8 — ABNORMAL LOW (ref 12–28)
BUN: 9 mg/dL (ref 8–27)
CO2: 28 mmol/L (ref 20–29)
Calcium: 9.6 mg/dL (ref 8.7–10.3)
Chloride: 104 mmol/L (ref 96–106)
Creatinine, Ser: 1.12 mg/dL — ABNORMAL HIGH (ref 0.57–1.00)
Glucose: 95 mg/dL (ref 70–99)
Potassium: 4.7 mmol/L (ref 3.5–5.2)
Sodium: 142 mmol/L (ref 134–144)
eGFR: 56 mL/min/{1.73_m2} — ABNORMAL LOW (ref >59)

## 2022-05-16 MED ORDER — HYDRALAZINE HCL 25 MG PO TABS
ORAL_TABLET | ORAL | 6 refills | Status: DC
  Filled 2022-05-16: qty 30, 10d supply, fill #0
  Filled 2022-07-19: qty 30, 10d supply, fill #1

## 2022-05-16 NOTE — Telephone Encounter (Signed)
Anticipate blood pressure will improve with increased dose of telmisartan.  Continue telmisartan 80 mg daily.  Rx hydralazine 25 mg as needed for SBP greater than 180.  She may take every 8 hours as needed.  [name], NP

## 2022-05-16 NOTE — Telephone Encounter (Addendum)
Results called to patient who verbalizes understanding!   Patient states she just lost her sister and she has not been taking it well. Patient just started the new medication on Thursday. Recent blood pressures have been as high as systolic 929 and diastolic has not been under 100 in days    7/21 180/100  Routing to C. Gilford Rile, NP as requested       ----- Message from [name], NP sent at 05/16/2022  4:38 PM EDT ----- Kidney function stable from previous.  Normal electrolytes.  Continue current medications.  Please have her send recent blood pressure readings to MyChart.

## 2022-05-16 NOTE — Telephone Encounter (Signed)
RN returned call to patient and provided the following recommendations. Rx sent to pharmacy.        "Anticipate blood pressure will improve with increased dose of telmisartan.  Continue telmisartan 80 mg daily.   Rx hydralazine 25 mg as needed for SBP greater than 180.  She may take every 8 hours as needed.   [name], NP "

## 2022-05-16 NOTE — Addendum Note (Signed)
Addended by: Gerald Stabs on: 05/16/2022 05:04 PM   Modules accepted: Orders

## 2022-05-17 ENCOUNTER — Other Ambulatory Visit (HOSPITAL_COMMUNITY): Payer: Self-pay

## 2022-05-30 ENCOUNTER — Other Ambulatory Visit (HOSPITAL_COMMUNITY): Payer: Self-pay

## 2022-05-30 ENCOUNTER — Telehealth: Payer: Self-pay | Admitting: Pharmacist

## 2022-05-30 NOTE — Chronic Care Management (AMB) (Signed)
Chronic Care Management Pharmacy Assistant   Name: [name]  MRN: [id number] DOB: Jun 12, 1961  Reason for Encounter: Disease State   Conditions to be addressed/monitored: HTN  Recent office visits:  04/07/22 [name], [name], MD - Patient presented for Routine General medical exam at a health care facility and other concerns. No medication changes.  Recent consult visits:  05/09/22 [name], NP (Cardiology) - Patient presented for Essential Hypertension. Increased Telmisartan.  03/27/22 [name] (Dermatology) - Claims encounter for Other cicatricial alopecia. No other visit details available.  Hospital visits:  on 01/03/22 due to S/P Cervical spinal fusion. Patient was present for 4 days   New?Medications Started at [facility] Discharge:?? -started    Medication Changes at Hospital Discharge: -Changed  methocarbamol (ROBAXIN   Medications Discontinued at Hospital Discharge: -Stopped  DULoxetine 60 MG capsule (Cymbalta) predniSONE 20 MG tablet (DELTASONE) senna-docusate 8.6-50 MG tablet (Senokot-S)   Medications that remain the same after Hospital Discharge:??  -All other medications will remain the same.      Medications: Outpatient Encounter Medications as of 05/30/2022  Medication Sig   acetaminophen (TYLENOL) 500 MG tablet Take 1,000 mg by mouth every 6 (six) hours as needed for headache.   ALPRAZolam (XANAX) 0.5 MG tablet Take 1/2 to 1 tablet (0.25-0.5 mg total) by mouth at bedtime as needed for anxiety.   atorvastatin (LIPITOR) 20 MG tablet TAKE 1 TABLET BY MOUTH ONCE DAILY   baclofen (LIORESAL) 10 MG tablet TAKE 1 TABLET BY MOUTH THREE TIMES DAILY AS NEEDED   betamethasone dipropionate 0.05 % lotion Apply a small amount to affected area once a day   busPIRone (BUSPAR) 5 MG tablet Take 1 tablet by mouth 2  times daily. (Patient taking differently: Take 5 mg by mouth 2 (two) times daily as needed (anxiety).)   carvedilol (COREG) 25 MG  tablet Take 2 tablets (50 mg total) by mouth 2 (two) times daily with a meal.   cholecalciferol (VITAMIN D3) 25 MCG (1000 UNIT) tablet Take 1,000 Units by mouth daily.   diphenhydrAMINE (BENADRYL) 25 MG tablet Take 25 mg by mouth daily as needed for itching.   Estradiol Acetate (FEMRING) 0.05 MG/24HR RING USE ONE RING VAGINALLY EVERY 3 MONTHS AS DIRECTED   gabapentin (NEURONTIN) 400 MG capsule Take 1 capsule by mouth three times daily.   hydrALAZINE (APRESOLINE) 25 MG tablet Please take every 8 hours as needed for Systolic Blood pressure greater than 180   meclizine (ANTIVERT) 25 MG tablet Take 25 mg by mouth 3 (three) times daily as needed for dizziness.   methocarbamol (ROBAXIN) 500 MG tablet Take 1 tablet by mouth 3 times every day as needed for muscle spasms   neomycin-bacitracin-polymyxin (NEOSPORIN) ointment Apply 1 application topically as needed for wound care.   oxyCODONE-acetaminophen (PERCOCET) 7.5-325 MG tablet Take 1 tablet by mouth daily as needed for severe pain.   telmisartan (MICARDIS) 80 MG tablet Take 1 tablet (80 mg total) by mouth daily.   traMADol (ULTRAM) 50 MG tablet Take 1 tablet by mouth every 6 (six) hours as needed.   No facility-administered encounter medications on file as of 05/30/2022.   Reviewed chart prior to disease state call. Spoke with patient regarding BP  Recent Office Vitals: BP Readings from Last 3 Encounters:  05/09/22 (!) 182/100  05/08/22 (!) 155/97  04/07/22 130/86   Pulse Readings from Last 3 Encounters:  05/09/22 79  04/07/22 75  01/07/22 60    Wt Readings from Last  3 Encounters:  05/09/22 168 lb 12.8 oz (76.6 kg)  04/07/22 170 lb (77.1 kg)  01/03/22 177 lb 14.6 oz (80.7 kg)     Kidney Function Lab Results  Component Value Date/Time   CREATININE 1.12 (H) 05/16/2022 09:29 AM   CREATININE 1.15 04/07/2022 09:06 AM   CREATININE 0.99 04/01/2019 03:35 PM   GFR 51.72 (L) 04/07/2022 09:06 AM   GFRNONAA 58 (L) 12/31/2021 09:31 AM   GFRAA  >60 09/23/2019 12:07 PM       Latest Ref Rng & Units 05/16/2022    9:29 AM 04/07/2022    9:06 AM 12/31/2021    9:31 AM  BMP  Glucose 70 - 99 mg/dL 95  92  88   BUN 8 - 27 mg/dL '9  10  15   '$ Creatinine 0.57 - 1.00 mg/dL 1.12  1.15  1.09   BUN/Creat Ratio 12 - 28 8     Sodium 134 - 144 mmol/L 142  141  139   Potassium 3.5 - 5.2 mmol/L 4.7  4.0  4.1   Chloride 96 - 106 mmol/L 104  101  107   CO2 20 - 29 mmol/L 28  32  25   Calcium 8.7 - 10.3 mg/dL 9.6  9.2  9.2     Current antihypertensive regimen:  Carvedilol 25 mg 2 tablets twice daily - Appropriate, Query effective, Query Safe, Accessible Telmisartan 80 mg 1 tablet daily - Appropriate, Query effective, Safe, Accessible How often are you checking your Blood Pressure? daily Current home BP readings: 160/84 170/90 What recent interventions/DTPs have been made by any provider to improve Blood Pressure control since last CPP Visit: Telmisartan has been increased to 80 mg once daily Any recent hospitalizations or ED visits since last visit with CPP? No  Adherence Review: Is the patient currently on ACE/ARB medication? Yes Does the patient have >5 day gap between last estimated fill dates? No  Notes: Per Jeni Salles inquired how patient is doing with anxiety and if she is using the Buspirone twice daily as prescribed she reports: it has been helping her somewhat. She also reports that the telmisartan increase has helped bring her blood pressure down some but she is still dealing very heavily with grieving the loss of her sister and it is not easy for her. She reports she is to follow up with Cardiology on Tue.   Care Gaps: COVID Booster - Overdue Flu Vaccine - Overdue CCM- 10/23 BP- 182/100 05/09/22 AWV- 11/22 Lab Results  Component Value Date   HGBA1C 5.5 03/06/2018    Star Rating Drugs: Telmisartan 80 mg - Last filled 04/1721 90 DS at [facility]  Atorvastatin (Lipitor) 20 mg - Last filled 04/04/22 90 DS at [facility] Pharmacist Assistant [phone]

## 2022-06-02 ENCOUNTER — Other Ambulatory Visit (HOSPITAL_COMMUNITY): Payer: Self-pay

## 2022-06-03 ENCOUNTER — Encounter (HOSPITAL_BASED_OUTPATIENT_CLINIC_OR_DEPARTMENT_OTHER): Payer: Self-pay | Admitting: Family

## 2022-06-03 ENCOUNTER — Ambulatory Visit (HOSPITAL_BASED_OUTPATIENT_CLINIC_OR_DEPARTMENT_OTHER): Payer: PPO | Admitting: Family

## 2022-06-03 ENCOUNTER — Ambulatory Visit (INDEPENDENT_AMBULATORY_CARE_PROVIDER_SITE_OTHER): Payer: PPO

## 2022-06-03 VITALS — BP 164/98 | HR 79 | Ht 65.0 in | Wt 166.0 lb

## 2022-06-03 DIAGNOSIS — R002 Palpitations: Secondary | ICD-10-CM

## 2022-06-03 DIAGNOSIS — R42 Dizziness and giddiness: Secondary | ICD-10-CM

## 2022-06-03 DIAGNOSIS — I10 Essential (primary) hypertension: Secondary | ICD-10-CM

## 2022-06-03 DIAGNOSIS — I517 Cardiomegaly: Secondary | ICD-10-CM

## 2022-06-03 DIAGNOSIS — R55 Syncope and collapse: Secondary | ICD-10-CM | POA: Diagnosis not present

## 2022-06-03 NOTE — Progress Notes (Signed)
Office Visit    Patient Name: [name] Date of Encounter: 06/03/2022  PCP:  [name], [name], MD   [place]  Cardiologist:  Pixie Casino, MD  Advanced Practice Provider:  No care team member to display Electrophysiologist:  None      Chief Complaint    [name] is a 61 y.o. female  presents today for hypertension  Past Medical History    Past Medical History:  Diagnosis Date   Anxiety    Aortic atherosclerosis ([place]) 04/16/2021   Atypical angina ([place])    Back pain    related to spinal stenosis and disc problem, radiates down left buttocks to leg., weakness occ.   Chest pain    a. 03/2015 Cath: nl cors; b. 03/2021 Cor CTA: Ca2+ = 0. Nl Cors.   Chronic pain syndrome    Dyspnea    GERD (gastroesophageal reflux disease)    Grade I diastolic dysfunction    Headache    Hyperlipidemia    Hypertension    Lumbar post-laminectomy syndrome    LVH (left ventricular hypertrophy) 12/15/2020   a. 11/2020 Echo: EF 65-70%, no rwma, sev asymm LVH with IVSd 1.9 cm. No LVOT obs @ rest. Gr1 DD. Triv MR.   PONV (postoperative nausea and vomiting)    Pulmonary nodules    a. 03/2021 CT Chest: 90m pulm nodules in bilat lower lobes. F/u 1 yr.   Right foot drop    Syncope    a. 11/2020 Zio: No significant arrhythmias.   Vaginal foreign object    "Uses Femring"   Past Surgical History:  Procedure Laterality Date   ABDOMINAL HYSTERECTOMY     ANTERIOR CERVICAL DECOMP/DISCECTOMY FUSION N/A 01/03/2022   Procedure: Anterior Cervical Decompression Fusion - Cervical four-Cervical five - Cervical five-Cervical six;  Surgeon: JEustace Moore MD;  [facility]  Service: Neurosurgery;  Laterality: N/A;   BIOPSY  12/16/2020   Procedure: BIOPSY;  Surgeon: SLadene Artist MD;  [facility]  Service: Endoscopy;;   CARDIAC CATHETERIZATION N/A 04/18/2015   Procedure: Left Heart Cath and Coronary Angiography;  Surgeon: MCharolette Forward MD;   [facility];  Service: Cardiovascular;  Laterality: N/A;   COLONOSCOPY W/ BIOPSIES AND POLYPECTOMY     ESOPHAGOGASTRODUODENOSCOPY (EGD) WITH PROPOFOL N/A 12/16/2020   Procedure: ESOPHAGOGASTRODUODENOSCOPY (EGD) WITH PROPOFOL;  Surgeon: SLadene Artist MD;  [facility];  Service: Endoscopy;  Laterality: N/A;   FOOT SURGERY Bilateral    [place]"bunion,bone spur, tendon" (1) -6'16, (1)-10'16   HEMATOMA EVACUATION N/A 01/05/2022   Procedure: Cervical Wound Exploration;  Surgeon: CAshok Pall MD;  [place] [facility]  Service: Neurosurgery;  Laterality: N/A;   IR EPIDUROGRAPHY  07/21/2018   LUMBAR LAMINECTOMY/DECOMPRESSION MICRODISCECTOMY Bilateral 12/28/2015   Procedure: MICRO LUMBAR DECOMPRESSION L4 - L5 BILATERALLY;  Surgeon: JSusa Day MD;  [facility];  Service: Orthopedics;  Laterality: Bilateral;   LUMBAR LAMINECTOMY/DECOMPRESSION MICRODISCECTOMY Bilateral 03/04/2018   Procedure: Revision of Microlumbar Decompression Bilateral Lumbar Four-Five;  Surgeon: BSusa Day MD;  [place] [facility]  Service: Orthopedics;  Laterality: Bilateral;  90 mins   SAVORY DILATION N/A 12/16/2020   Procedure: SAVORY DILATION;  Surgeon: SLadene Artist MD;  [facility];  Service: Endoscopy;  Laterality: N/A;   SPINAL CORD STIMULATOR INSERTION N/A 09/28/2019   Procedure: THORACIC SPINAL CORD STIMULATOR INSERTION;  Surgeon: BMelina Schools MD;  [place] [facility]  Service: Orthopedics;  Laterality: N/A;  2.5 hrs   SPINAL CORD STIMULATOR REMOVAL N/A 05/27/2021   Procedure: LUMBAR SPINAL CORD STIMULATOR REMOVAL;  Surgeon: Deetta Perla, MD;  [facility];  Service: Neurosurgery;  Laterality: N/A;   TUBAL LIGATION     WISDOM TOOTH EXTRACTION     WOUND EXPLORATION N/A 03/04/2018   Procedure: EXPLORATION OF LUMBAR DECOMPRESSION WOUND;  Surgeon: [name], MD;  Location: [place];  Service: Orthopedics;  Laterality: N/A;   Allergies  Allergies  Allergen  Reactions   Cephalosporins Anaphylaxis   Penicillins Anaphylaxis and Hives    Did it involve swelling of the face/tongue/throat, SOB, or low BP? Yes Did it involve sudden or severe rash/hives, skin peeling, or any reaction on the inside of your mouth or nose? Yes Did you need to seek medical attention at a hospital or doctor's office? Yes When did it last happen?      10+ years If all above answers are "NO", may proceed with cephalosporin use.     Anesthetics, Amide Nausea And Vomiting    Does not know name of it. States they put it on record foot center.    Betadine [Povidone Iodine] Other (See Comments)    "Skin Burn" caused scar on Left buttock   Latex Hives, Itching and Rash   Peach [Prunus Persica] Hives   Claritin [Loratadine]     Rash, pruritis     History of Present Illness    [name] [name] is a 61 y.o. female with a hx of HTN, HLD, lumbar back pain, anxiety last seen 05/09/22  Prior cardiac cath 2016 with no coronary disease. 03/2021 cardiac CT With coronary calcium score of 0. 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  She was seen  05/09/22 for elevated BP and chest pain in setting of losing her sister unexpectedly due to stroke. BP at home 170s-180s and similar in office. Telmisartan dose increased. Chest pain was atypical for angina and thought to be related to grief, stress. She was also provided Hydralazine '25mg'$  PRN for SBP >180.   Presents today for follow up. Reports BP at home still often in the 170s. Tells me a few days after her last clinic visit had a good reading but since then it has been high.   Tells me she had a fall recently. Prior to fall "did not feel good" as if she might throw up. Walking to the  bathroom felt dizzy and then "next thing I knew I was on the floor". Her husband described her as being "discombobulated". She has been noticing blurry vision and has upcoming eye doctor visit. She is not sure if she lost consciousness. Describes lightheadedness which feels different than her previous vertigo.   She has had some episode of her heart racing, fluttering.   Tells me her grief is improving but still understandably difficult. This Saturday did still have a bad moment with her grief - describes as the worst time o fher life.   EKGs/Labs/Other Studies Reviewed:   The following studies were reviewed today:  EKG:  EKG is not ordered today.  The ekg independently reviewed from 05/09/22 revealed NSR 79 bpm with no acute ST/T wave changes.   Recent Labs: 12/31/2021: Hemoglobin 13.1; Platelets 256 04/07/2022: ALT 14 05/16/2022: BUN 9; Creatinine, Ser 1.12; Potassium 4.7; Sodium  142  Recent Lipid Panel    Component Value Date/Time   CHOL 187 04/07/2022 0906   TRIG 48.0 04/07/2022 0906   HDL 61.50 04/07/2022 0906   CHOLHDL 3 04/07/2022 0906   VLDL 9.6 04/07/2022 0906   LDLCALC 116 (H) 04/07/2022 0906     Home Medications   No outpatient medications have been marked as taking for the 06/03/22 encounter (Appointment) with [name], NP.     Review of Systems      All other systems reviewed and are otherwise negative except as noted above.  Physical Exam    VS:  There were no vitals taken for this visit. , BMI There is no height or weight on file to calculate BMI.  Wt Readings from Last 3 Encounters:  05/09/22 168 lb 12.8 oz (76.6 kg)  04/07/22 170 lb (77.1 kg)  01/03/22 177 lb 14.6 oz (80.7 kg)     GEN: Well nourished, well developed, in no acute distress. HEENT: normal. Neck: Supple, no JVD, carotid bruits, or masses. Cardiac: RRR, no murmurs, rubs, or gallops. No clubbing, cyanosis, edema.  Radials/PT 2+ and equal bilaterally.  Respiratory:  Respirations regular  and unlabored, clear to auscultation bilaterally. GI: Soft, nontender, nondistended. MS: No deformity or atrophy. Skin: Warm and dry, no rash. Neuro:  Strength and sensation are intact. Psych: Normal affect. Tearful.   Assessment & Plan    Chest pain in adult - 03/2021 coronary calcium score 0. Cardiac MRI with no evidence of HCM. Recent chest pain in setting of losing her sister. Episode immediately prior to funeral. Reassurance provided that EKG no acute changes, atypical for angina. No indication for ischemic evaluation.   Grief - Recently loss her sister who was more like a mother to her. Condolences offered. Good support of family and church members. Previously offered referral to psychology which she prefers to think about.   HTN / LVH - BP not at goal. Stop Telmisartan - in 2 days lab work including TSH, metanephrines/catecholamines, renin-aldosterone, BMP. Start Chlorthalidone '25mg'$  daily. Continue Coreg '50mg'$  BID. Likely higher in setting of stress and grief. Discussed to monitor BP at home at least 2 hours after medications and sitting for 5-10 minutes.   Palpitations / ? Syncope - Plan for 7 day ZIO, carotid duplex, and echo. Recent fall when she is not sure if she lost consciousness as well as palpitations.   Disposition: Follow up in 2 weeks with Pixie Casino, MD or APP.  Signed, [name], NP 06/03/2022, 9:53 AM [place]

## 2022-06-03 NOTE — Patient Instructions (Addendum)
Medication Instructions:  Your physician has recommended you make the following change in your medication:    STOP Telmisartan  START Chlorthalidone '25mg'$  daily  AS NEEDED: Hydralazine '25mg'$  every 8 hours for systolic (top number) blood pressures >180.   *If you need a refill on your cardiac medications before your next appointment, please call your pharmacy*   Lab Work: Your physician recommends that you return for lab work in 2-3 days in the morning, fasting: thyroid, metnephrines/catecholamines, renin-aldosterone, BMP  You may come to the...   [facility] (3rd floor) [address] [place], [place] [address]  Open: 8am-Noon and 1pm-4:30pm  Please ring the doorbell on the small table when you exit the elevator and the Lab Tech will come get you  [place] [facility] [address] [place], [place], [place] [address] Open: 8am-1pm, then 2pm-4:30pm   [place]- Please see attached locations sheet stapled to your lab work with address and hours.    If you have labs (blood work) drawn today and your tests are completely normal, you will receive your results only by: [place] (if you have MyChart) OR A paper copy in the mail If you have any lab test that is abnormal or we need to change your treatment, we will call you to review the results.   Testing/Procedures: Your physician has recommended that you wear a Zio monitor. Please wear this until the day of your Echocardiogram/ Carotid.   This monitor is a medical device that records the heart's electrical activity. Doctors most often use these monitors to diagnose arrhythmias. Arrhythmias are problems with the speed or rhythm of the heartbeat. The monitor is a small device applied to your chest. You can wear one while you do your normal daily activities. While wearing this monitor if you have any symptoms to push the button and record what you felt. Once you have worn this monitor for  the period of time provider prescribed (until the time of your echocardiogram), you will return the monitor device in the postage paid box. Once it is returned they will download the data collected and provide [place] with a report which the provider will then review and we will call you with those results. Important tips:  Avoid showering during the first 24 hours of wearing the monitor. Avoid excessive sweating to help maximize wear time. Do not submerge the device, no hot tubs, and no swimming pools. Keep any lotions or oils away from the patch. After 24 hours you may shower with the patch on. Take brief showers with your back facing the shower head.  Do not remove patch once it has been placed because that will interrupt data and decrease adhesive wear time. Push the button when you have any symptoms and write down what you were feeling. Once you have completed wearing your monitor, remove and place into box which has postage paid and place in your outgoing mailbox.  If for some reason you have misplaced your box then call our office and we can provide another box and/or mail it off for you.   Your physician has requested that you have an echocardiogram. Echocardiography is a painless test that uses sound waves to create images of your heart. It provides your doctor with information about the size and shape of your heart and how well your heart's chambers and valves are working. This procedure takes approximately one hour. There are no restrictions for this procedure.   Your physician has requested  that you have a carotid duplex. This test is an ultrasound of the carotid arteries in your neck. It looks at blood flow through these arteries that supply the brain with blood. Allow one hour for this exam. There are no restrictions or special instructions.    Follow-Up: At [facility], you and your health needs are our priority.  As part of our continuing mission to provide you with exceptional heart  care, we have created designated Provider Care Teams.  These Care Teams include your primary Cardiologist (physician) and Advanced Practice Providers (APPs -  Physician Assistants and Nurse Practitioners) who all work together to provide you with the care you need, when you need it.  We recommend signing up for the patient portal called "MyChart".  Sign up information is provided on this After Visit Summary.  MyChart is used to connect with patients for Virtual Visits (Telemedicine).  Patients are able to view lab/test results, encounter notes, upcoming appointments, etc.  Non-urgent messages can be sent to your provider as well.   To learn more about what you can do with MyChart, go to [email or web address].    Your next appointment:   2 week(s)  The format for your next appointment:   In Person  Provider:   K. Mali Hilty, MD or Laurann Montana, NP    Other Instructions  Tips to Measure your Blood Pressure Correctly Here's what you can do to ensure a correct reading:  Don't drink a caffeinated beverage or smoke during the 30 minutes before the test.  Sit quietly for five minutes before the test begins.  During the measurement, sit in a chair with your feet on the floor and your arm supported so your elbow is at about heart level.  The inflatable part of the cuff should completely cover at least 80% of your upper arm, and the cuff should be placed on bare skin, not over a shirt.  Don't talk during the measurement.  Blood pressure categories  Blood pressure category SYSTOLIC (upper number)  DIASTOLIC (lower number)  Normal Less than 120 mm Hg and Less than 80 mm Hg  Elevated 120-129 mm Hg and Less than 80 mm Hg  High blood pressure: Stage 1 hypertension 130-139 mm Hg or 80-89 mm Hg  High blood pressure: Stage 2 hypertension 140 mm Hg or higher or 90 mm Hg or higher  Hypertensive crisis (consult your doctor immediately) Higher than 180 mm Hg and/or Higher than 120 mm Hg  Source:  American Heart Association and American Stroke Association. For more on getting your blood pressure under control, buy Controlling Your Blood Pressure, a Special Health Report from [facility].   Blood Pressure Log   Date   Time  Blood Pressure  Example: Nov 1 9 AM 124/78

## 2022-06-06 DIAGNOSIS — I10 Essential (primary) hypertension: Secondary | ICD-10-CM | POA: Diagnosis not present

## 2022-06-06 DIAGNOSIS — I517 Cardiomegaly: Secondary | ICD-10-CM | POA: Diagnosis not present

## 2022-06-06 DIAGNOSIS — R42 Dizziness and giddiness: Secondary | ICD-10-CM | POA: Diagnosis not present

## 2022-06-09 ENCOUNTER — Encounter (HOSPITAL_BASED_OUTPATIENT_CLINIC_OR_DEPARTMENT_OTHER): Payer: Self-pay

## 2022-06-11 ENCOUNTER — Other Ambulatory Visit (HOSPITAL_COMMUNITY): Payer: Self-pay

## 2022-06-11 ENCOUNTER — Telehealth (HOSPITAL_BASED_OUTPATIENT_CLINIC_OR_DEPARTMENT_OTHER): Payer: Self-pay

## 2022-06-11 DIAGNOSIS — I10 Essential (primary) hypertension: Secondary | ICD-10-CM

## 2022-06-11 LAB — METANEPHRINES, PLASMA
Metanephrine, Free: 42.1 pg/mL (ref 0.0–88.0)
Normetanephrine, Free: 85.2 pg/mL (ref 0.0–244.0)

## 2022-06-11 LAB — ALDOSTERONE + RENIN ACTIVITY W/ RATIO
ALDOS/RENIN RATIO: >54.5 — ABNORMAL HIGH (ref 0.0–30.0)
ALDOSTERONE: 9.1 ng/dL (ref 0.0–30.0)
Renin: <0.167 ng/mL/hr — ABNORMAL LOW (ref 0.167–5.380)

## 2022-06-11 LAB — BASIC METABOLIC PANEL
BUN/Creatinine Ratio: 8 — ABNORMAL LOW (ref 12–28)
BUN: 8 mg/dL (ref 8–27)
CO2: 28 mmol/L (ref 20–29)
Calcium: 9.2 mg/dL (ref 8.7–10.3)
Chloride: 103 mmol/L (ref 96–106)
Creatinine, Ser: 1.05 mg/dL — ABNORMAL HIGH (ref 0.57–1.00)
Glucose: 92 mg/dL (ref 70–99)
Potassium: 4.3 mmol/L (ref 3.5–5.2)
Sodium: 142 mmol/L (ref 134–144)
eGFR: 61 mL/min/{1.73_m2} (ref >59)

## 2022-06-11 LAB — CATECHOLAMINES, FRACTIONATED, PLASMA
Dopamine: <30 pg/mL (ref 0–48)
Epinephrine: 41 pg/mL (ref 0–62)
Norepinephrine: 453 pg/mL (ref 0–874)

## 2022-06-11 LAB — THYROID PANEL WITH TSH
Free Thyroxine Index: 2.4 (ref 1.2–4.9)
T3 Uptake Ratio: 25 % (ref 24–39)
T4, Total: 9.4 ug/dL (ref 4.5–12.0)
TSH: 2.61 u[IU]/mL (ref 0.450–4.500)

## 2022-06-11 MED ORDER — SPIRONOLACTONE 25 MG PO TABS
25.0000 mg | ORAL_TABLET | Freq: Every day | ORAL | 3 refills | Status: DC
  Filled 2022-06-11: qty 90, 90d supply, fill #0

## 2022-06-11 NOTE — Telephone Encounter (Signed)
Returned call to patient to advise patient to stay on all BP meds and add on spiro in addition.

## 2022-06-11 NOTE — Telephone Encounter (Signed)
Patent wants to know if she needs to stay on her other BP medication as well as the Spironolactone.

## 2022-06-11 NOTE — Telephone Encounter (Addendum)
Called patient to relay the following results, patient verbalizes understanding and gave the following information, patient not taking Telmisartan. Patient endorses just not feeling like herself and is very concerned that she is going to have a stroke or a heart attack. Patient endorses having headaches in the morning. Reassured patient that we are working on her blood pressure. Advised patient that I would send the medicine in today and I would check on her in a week.   BP Log:   182/94 190/101 187/101 190/94 184/100 190/100  Routed to Cheryl Montana, [name] as update.    ----- Message from Cheryl Dubonnet, [name] sent at 06/11/2022 12:08 PM EDT ----- Elevated renin aldosterone ratio. Stable kidney function. Normal electrolytes. Normal catecholamines, metanephrines, thyroid.   Please ensure not taking Telmisartan - was discontinued at last OV but not removed from med list. If taking, will need to repeat renin-aldosterone lab.   Please ask her for recent BP readings. (Will need to call as she did not respond to MyChart)  Start Spironolactone '25mg'$  daily with BMP in 1-2 weeks as this will help lower blood pressure based on the elevated renin-aldosterone level.

## 2022-06-11 NOTE — Telephone Encounter (Signed)
Reminder set to follow up with patient next week!

## 2022-06-11 NOTE — Telephone Encounter (Signed)
Spironolactone '25mg'$  will help to lower blood pressure. Can we please call her Friday or Monday to check on blood pressure?  [name], NP

## 2022-06-16 ENCOUNTER — Telehealth (HOSPITAL_BASED_OUTPATIENT_CLINIC_OR_DEPARTMENT_OTHER): Payer: Self-pay | Admitting: Cardiovascular Disease

## 2022-06-16 ENCOUNTER — Encounter (HOSPITAL_BASED_OUTPATIENT_CLINIC_OR_DEPARTMENT_OTHER): Payer: Self-pay

## 2022-06-16 ENCOUNTER — Ambulatory Visit (HOSPITAL_BASED_OUTPATIENT_CLINIC_OR_DEPARTMENT_OTHER): Payer: PPO

## 2022-06-16 ENCOUNTER — Other Ambulatory Visit (INDEPENDENT_AMBULATORY_CARE_PROVIDER_SITE_OTHER): Payer: PPO | Admitting: *Deleted

## 2022-06-16 ENCOUNTER — Emergency Department (HOSPITAL_COMMUNITY)
Admission: EM | Admit: 2022-06-16 | Discharge: 2022-06-16 | Discharge status: Against Medical Advice | Payer: PPO | Attending: Emergency Medicine | Admitting: Emergency Medicine

## 2022-06-16 DIAGNOSIS — R197 Diarrhea, unspecified: Secondary | ICD-10-CM | POA: Insufficient documentation

## 2022-06-16 DIAGNOSIS — R55 Syncope and collapse: Secondary | ICD-10-CM | POA: Insufficient documentation

## 2022-06-16 DIAGNOSIS — R079 Chest pain, unspecified: Secondary | ICD-10-CM

## 2022-06-16 DIAGNOSIS — I10 Essential (primary) hypertension: Secondary | ICD-10-CM | POA: Diagnosis not present

## 2022-06-16 DIAGNOSIS — R0789 Other chest pain: Secondary | ICD-10-CM | POA: Diagnosis not present

## 2022-06-16 DIAGNOSIS — M79602 Pain in left arm: Secondary | ICD-10-CM | POA: Diagnosis not present

## 2022-06-16 DIAGNOSIS — Z5321 Procedure and treatment not carried out due to patient leaving prior to being seen by health care provider: Secondary | ICD-10-CM | POA: Diagnosis not present

## 2022-06-16 DIAGNOSIS — R112 Nausea with vomiting, unspecified: Secondary | ICD-10-CM | POA: Insufficient documentation

## 2022-06-16 DIAGNOSIS — G4489 Other headache syndrome: Secondary | ICD-10-CM | POA: Diagnosis not present

## 2022-06-16 NOTE — Telephone Encounter (Signed)
Patient presented to cardiology for echocardiogram this am.  Stated that she didn't feel well.  Had discomfort in L neck and chest.  She became unresponsive during the echo.  Code called.  Patient was unresponsive with eyes rolled back and L arm shaking.  She never lost a pulse.  BP 181/97.  HR 94. O2 100%.  Glucose 112.  She regained consciousness and reported feeling ill, HA, nausea.  EKG sinus rhythm 98 bpm.  Patient attempted to get up and briefly lost consciousness again.  EMS called and transferred to [facility].  Patient's husband was notified.    Bryahna Lesko C. Oval Linsey, MD, [facility] 06/16/2022 10:15 AM

## 2022-06-16 NOTE — Telephone Encounter (Signed)
Patient called back stating that Dr [name] out of the office and first appointment available 8/30.  Advised patient to call back that someone should be covering her while out of the office, needs evaluation ASAP  Explained again to patient ED would have been best evaluation and to go back if starts feeling bad

## 2022-06-16 NOTE — ED Triage Notes (Signed)
Patient was at cardiology office and had syncopal episode and has a hx of this.   She was there for routine check up. This am had n/v/d and left arm pain she has already been seen by cardiology team and everything was at baseline.

## 2022-06-16 NOTE — Telephone Encounter (Signed)
[name] called back again to advise appointment with PCP office tomorrow  [name] stated she felt she may be dehydrated secondary to having "worst diarrhea ever" in middle of the night  She is resting on couch currently Advised [name] if she starts to feel worse needs to go back to ED as Dr Oval Linsey and Dr Harrell Gave felt she needed to be seen ED since her seizure like activity today during visit to get Echo  [name] stated she would go to ED if worse

## 2022-06-16 NOTE — Telephone Encounter (Signed)
Patient called back to say that she left the hospital after she got her IV. She started to feel better, she stated that she sign the papers to leave without dr recommendation. Please advise

## 2022-06-16 NOTE — Telephone Encounter (Signed)
I got Mrs. Humphreys from waiting area and asked her how she was. She said not good , I don't feel good. She reported having pain around her neck area since midnight which was still ongoing. I asked her had she had any nausea or sweating. She said she felt nauseous and hot this morning but then that stopped. She did walk to the echo room but was very slow. She sat on the bed in the echo room and I turned to check something in the computer when I turned back she had slumped over on the bed. She was responsive. I said you need to go to the ER. She said can we do the test first. I said we could try. I went to go get the dynamap and ask a nurse to help me assess her. When we can got back to room less than two minutes she was laying on bed non responsive. Activated code measures. Dr.Ehrenberg and Dr. Harrell Gave with other staff came to the room and orchestrated her care until EMS arrived. Family was notified.    Leavy Cella, RDCS, RVT

## 2022-06-16 NOTE — ED Notes (Signed)
Patient stated she is feeling better and is not wanting to be seen

## 2022-06-16 NOTE — Telephone Encounter (Signed)
Discussed with Dr Harrell Gave who was in the room with patient until patient left via EMS  Spoke with patient who stated lot of people in ED and was told would be a very long wait Explained to patient staying at the ED would have best thing and recommended, if needed additional testing that could done quicker than outpatient.  Per Dr Harrell Gave patient needs to contact PCP to get seizure like activity worked up as soon as possible, once cleared by them can reschedule her testing  Advised to go back to ED if she started feeling poorly again  Patient stated she would call Dr Doug Sou office to follow up

## 2022-06-17 ENCOUNTER — Encounter: Payer: Self-pay | Admitting: Family Medicine

## 2022-06-17 ENCOUNTER — Ambulatory Visit (INDEPENDENT_AMBULATORY_CARE_PROVIDER_SITE_OTHER): Payer: PPO | Admitting: Family Medicine

## 2022-06-17 ENCOUNTER — Inpatient Hospital Stay: Payer: PPO | Admitting: Family Medicine

## 2022-06-17 ENCOUNTER — Encounter (HOSPITAL_BASED_OUTPATIENT_CLINIC_OR_DEPARTMENT_OTHER): Payer: Self-pay

## 2022-06-17 ENCOUNTER — Other Ambulatory Visit (HOSPITAL_COMMUNITY): Payer: Self-pay

## 2022-06-17 VITALS — BP 172/96 | HR 80 | Temp 98.2°F | Ht 65.0 in | Wt 162.2 lb

## 2022-06-17 DIAGNOSIS — I10 Essential (primary) hypertension: Secondary | ICD-10-CM | POA: Diagnosis not present

## 2022-06-17 DIAGNOSIS — R404 Transient alteration of awareness: Secondary | ICD-10-CM | POA: Diagnosis not present

## 2022-06-17 MED ORDER — AMLODIPINE BESYLATE 10 MG PO TABS
10.0000 mg | ORAL_TABLET | Freq: Every day | ORAL | 3 refills | Status: DC
  Filled 2022-06-17: qty 90, 90d supply, fill #0

## 2022-06-17 NOTE — Telephone Encounter (Signed)
Patient has a visit today with Dr. Elease Hashimoto.

## 2022-06-17 NOTE — Addendum Note (Signed)
Addended by: Gerald Stabs on: 06/17/2022 10:20 AM   Modules accepted: Orders

## 2022-06-17 NOTE — Telephone Encounter (Signed)
See telephone encounter.   [name], NP

## 2022-06-17 NOTE — Progress Notes (Signed)
Established Patient Office Visit  Subjective   Patient ID: [name], female    DOB: 1960-12-15  Age: 61 y.o. MRN: [id number]  Chief Complaint  Patient presents with   Hospitalization Follow-up    HPI   [name] is seen as a work in by me in absence of her primary who is on vacation.  She has history of hypertension, chronic diastolic heart failure, recurrent depression, lumbar spinal stenosis, and hyperlipidemia.  She had brief episode of unresponsiveness yesterday during echocardiogram through cardiology.  She stated that she "did not feel well".  She woke up around 4 AM Monday with presyncopal symptoms and some nausea.  She states her blood pressure was 190/100.  She went in for echo and reported a little bit of discomfort in her left neck.  She became unresponsive during the echo.  There was also report that her eyes rolled back and left arm was shaking.  Blood pressure 181/97 with heart rate of 94 and O2 sat 100% with glucose 112.  She regained consciousness within minutes and felt ill with some headache and nausea.  EKG showed sinus rhythm with rate around 98.  EMS was called and patient was transported to [facility].  She declined staying there for further evaluation.  It looks like troponins were ordered but she left before these were drawn.  She does have resistant hypertension and had recent lab work per cardiology with elevated aldosterone/renin ratio.  She started on Aldactone 25 mg last Thursday.  She also had amlodipine 10 mg called in earlier today which she has not yet started on.  She has no reported history of seizure.  She does relate about 2 weeks ago she fell after what sounds like brief loss of consciousness at home.  No head injury.   Patient states with episode above during echocardiogram that she felt very lethargic afterwards.  She was at a conference last weekend from Thursday to Sunday and states that she generally felt well during that time.  She does  recall Saturday night sensation of "fluttering "in her chest.  Denied any chest pain.  Past Medical History:  Diagnosis Date   Anxiety    Aortic atherosclerosis ([place]) 04/16/2021   Atypical angina ([facility])    Back pain    related to spinal stenosis and disc problem, radiates down left buttocks to leg., weakness occ.   Chest pain    a. 03/2015 Cath: nl cors; b. 03/2021 Cor CTA: Ca2+ = 0. Nl Cors.   Chronic pain syndrome    Dyspnea    GERD (gastroesophageal reflux disease)    Grade I diastolic dysfunction    Headache    Hyperlipidemia    Hypertension    Lumbar post-laminectomy syndrome    LVH (left ventricular hypertrophy) 12/15/2020   a. 11/2020 Echo: EF 65-70%, no rwma, sev asymm LVH with IVSd 1.9 cm. No LVOT obs @ rest. Gr1 DD. Triv MR.   PONV (postoperative nausea and vomiting)    Pulmonary nodules    a. 03/2021 CT Chest: 75mm pulm nodules in bilat lower lobes. F/u 1 yr.   Right foot drop    Syncope    a. 11/2020 Zio: No significant arrhythmias.   Vaginal foreign object    "Uses Femring"   Past Surgical History:  Procedure Laterality Date   ABDOMINAL HYSTERECTOMY     ANTERIOR CERVICAL DECOMP/DISCECTOMY FUSION N/A 01/03/2022   Procedure: Anterior Cervical Decompression Fusion - Cervical four-Cervical five - Cervical five-Cervical six;  Surgeon:  Eustace Moore, MD;  Location: [place];  Service: Neurosurgery;  Laterality: N/A;   BIOPSY  12/16/2020   Procedure: BIOPSY;  Surgeon: Ladene Artist, MD;  Location: [place];  Service: Endoscopy;;   CARDIAC CATHETERIZATION N/A 04/18/2015   Procedure: Left Heart Cath and Coronary Angiography;  Surgeon: [name], MD;  Location: [place] CV LAB;  Service: Cardiovascular;  Laterality: N/A;   COLONOSCOPY W/ BIOPSIES AND POLYPECTOMY     ESOPHAGOGASTRODUODENOSCOPY (EGD) WITH PROPOFOL N/A 12/16/2020   Procedure: ESOPHAGOGASTRODUODENOSCOPY (EGD) WITH PROPOFOL;  Surgeon: Ladene Artist, MD;  Location: [facility] ENDOSCOPY;  Service: Endoscopy;   Laterality: N/A;   FOOT SURGERY Bilateral    [place] "bunion,bone spur, tendon" (1) -6'16, (1)-10'16   HEMATOMA EVACUATION N/A 01/05/2022   Procedure: Cervical Wound Exploration;  Surgeon: Ashok Pall, MD;  Location: [place];  Service: Neurosurgery;  Laterality: N/A;   IR EPIDUROGRAPHY  07/21/2018   LUMBAR LAMINECTOMY/DECOMPRESSION MICRODISCECTOMY Bilateral 12/28/2015   Procedure: MICRO LUMBAR DECOMPRESSION L4 - L5 BILATERALLY;  Surgeon: [name], MD;  Location: [facility];  Service: Orthopedics;  Laterality: Bilateral;   LUMBAR LAMINECTOMY/DECOMPRESSION MICRODISCECTOMY Bilateral 03/04/2018   Procedure: Revision of Microlumbar Decompression Bilateral Lumbar Four-Five;  Surgeon: [name], MD;  Location: [place];  Service: Orthopedics;  Laterality: Bilateral;  90 mins   SAVORY DILATION N/A 12/16/2020   Procedure: SAVORY DILATION;  Surgeon: Ladene Artist, MD;  Location: [facility] ENDOSCOPY;  Service: Endoscopy;  Laterality: N/A;   SPINAL CORD STIMULATOR INSERTION N/A 09/28/2019   Procedure: THORACIC SPINAL CORD STIMULATOR INSERTION;  Surgeon: Melina Schools, MD;  Location: [place];  Service: Orthopedics;  Laterality: N/A;  2.5 hrs   SPINAL CORD STIMULATOR REMOVAL N/A 05/27/2021   Procedure: LUMBAR SPINAL CORD STIMULATOR REMOVAL;  Surgeon: Deetta Perla, MD;  Location: [facility];  Service: Neurosurgery;  Laterality: N/A;   TUBAL LIGATION     WISDOM TOOTH EXTRACTION     WOUND EXPLORATION N/A 03/04/2018   Procedure: EXPLORATION OF LUMBAR DECOMPRESSION WOUND;  Surgeon: [name], MD;  Location: [place];  Service: Orthopedics;  Laterality: N/A;    reports that she has never smoked. She has never used smokeless tobacco. She reports that she does not drink alcohol and does not use drugs. family history includes Breast cancer in her sister; Breast cancer (age of onset: 89) in her sister; Cancer in her father; Heart attack in her mother and sister; Lung cancer in her father; Multiple myeloma in her sister;  Pancreatic cancer in her sister; Stomach cancer in her cousin; Throat cancer in her brother. Allergies  Allergen Reactions   Cephalosporins Anaphylaxis   Penicillins Anaphylaxis and Hives    Did it involve swelling of the face/tongue/throat, SOB, or low BP? Yes Did it involve sudden or severe rash/hives, skin peeling, or any reaction on the inside of your mouth or nose? Yes Did you need to seek medical attention at a hospital or doctor's office? Yes When did it last happen?      10+ years If all above answers are "NO", may proceed with cephalosporin use.     Anesthetics, Amide Nausea And Vomiting    Does not know name of it. States they put it on record foot center.    Betadine [Povidone Iodine] Other (See Comments)    "Skin Burn" caused scar on Left buttock   Latex Hives, Itching and Rash   Peach [Prunus Persica] Hives   Claritin [Loratadine]     Rash, pruritis  Review of Systems  Constitutional:  Negative for chills, fever and weight loss.  Eyes:  Negative for blurred vision.  Respiratory:  Negative for cough, hemoptysis and shortness of breath.   Cardiovascular:  Negative for chest pain, orthopnea and leg swelling.  Gastrointestinal:  Negative for abdominal pain.  Genitourinary:  Negative for dysuria.  Neurological:        See HPI      Objective:     BP (!) 172/96 (BP Location: Left Arm, Cuff Size: Normal)   Pulse 80   Temp 98.2 F (36.8 C) (Oral)   Ht $R'5\' 5"'te$  (1.651 m)   Wt 162 lb 3.2 oz (73.6 kg)   SpO2 98%   BMI 26.99 kg/m  BP Readings from Last 3 Encounters:  06/17/22 (!) 172/96  06/03/22 (!) 164/98  05/09/22 (!) 182/100   Wt Readings from Last 3 Encounters:  06/17/22 162 lb 3.2 oz (73.6 kg)  06/03/22 166 lb (75.3 kg)  05/09/22 168 lb 12.8 oz (76.6 kg)      Physical Exam Vitals reviewed.  Constitutional:      General: She is not in acute distress.    Appearance: Normal appearance. She is not ill-appearing.  Cardiovascular:     Rate and Rhythm:  Normal rate and regular rhythm.     Heart sounds:     No gallop.  Pulmonary:     Effort: Pulmonary effort is normal.     Breath sounds: Normal breath sounds. No wheezing or rales.  Musculoskeletal:     Cervical back: Neck supple.  Neurological:     General: No focal deficit present.     Mental Status: She is alert and oriented to person, place, and time.     Cranial Nerves: No cranial nerve deficit.     Motor: No weakness.     Coordination: Coordination normal.  Psychiatric:        Mood and Affect: Mood normal.      No results found for any visits on 06/17/22.  Last CBC Lab Results  Component Value Date   WBC 5.1 12/31/2021   HGB 13.1 12/31/2021   HCT 39.6 12/31/2021   MCV 90.6 12/31/2021   MCH 30.0 12/31/2021   RDW 13.3 12/31/2021   PLT 256 25/02/3975   Last metabolic panel Lab Results  Component Value Date   GLUCOSE 92 06/06/2022   NA 142 06/06/2022   K 4.3 06/06/2022   CL 103 06/06/2022   CO2 28 06/06/2022   BUN 8 06/06/2022   CREATININE 1.05 (H) 06/06/2022   EGFR 61 06/06/2022   CALCIUM 9.2 06/06/2022   PHOS 2.7 06/01/2007   PROT 7.2 04/07/2022   ALBUMIN 4.1 04/07/2022   BILITOT 0.4 04/07/2022   ALKPHOS 70 04/07/2022   AST 18 04/07/2022   ALT 14 04/07/2022   ANIONGAP 7 12/31/2021      The 10-year ASCVD [name] score (Arnett DK, et al., 2019) is: 13.8%    Assessment & Plan:   61 year old female with history of resistant hypertension with recent episode of brief unconsciousness during echocardiogram.  She did recall having some nausea prior to her unconsciousness but no history of vasovagal syncope.  Concern from cardiology was possible seizure versus pseudoseizure.  -Recommend continue close follow-up with cardiology regarding echocardiogram and Zio patch monitoring along with management of her hypertension -Recommend setting up neurology referral to further evaluate her brief episode recently of unconsciousness with concern for possible seizure versus  pseudoseizure -Recommend she avoid driving until further evaluated  No follow-ups on file.    Carolann Littler, MD

## 2022-06-17 NOTE — Patient Instructions (Signed)
Try to avoid driving until further evaluated  I am setting up neurology evaluation.

## 2022-06-17 NOTE — Telephone Encounter (Signed)
RN returned call to patient, reviewed the following information and rx to pharmacy. Patient got a call from a nursing program through her insurance company that can send a nurse to her home during they day and night to prevent ED visits in the future. Patient unable to remember the name of the company while on the phone. Patient agreeable to amlodipine '10mg'$  daily. Patient has PCP appointment today at 2pm. Patient endorses that she does feel some better today, just has very little energy. Patient states she was contacted earlier and her tests were rescheduled for 9/7   Per Laurann Montana, NP order amlodipine '10mg'$  daily and patient not able to reschedule testing until seen by PCP for seizure like activity.

## 2022-06-17 NOTE — Telephone Encounter (Signed)
Documentation from Dr. Oval Linsey regarding the episode which was on MyChart encounter.  Patient presented to cardiology for echocardiogram this am.  Stated that she didn't feel well.  Had discomfort in L neck and chest.  She became unresponsive during the echo.  Code called.  Patient was unresponsive with eyes rolled back and L arm shaking.  She never lost a pulse.  BP 181/97.  HR 94. O2 100%.  Glucose 112.  She regained consciousness and reported feeling ill, HA, nausea.  EKG sinus rhythm 98 bpm.  Patient attempted to get up and briefly lost consciousness again.  EMS called and transferred to [facility].  Patient's husband was notified.     Tiffany C. Oval Linsey, MD, [facility] 06/16/2022 10:15 AM   _______________________________  She reported SBP still in 180s via MyChart and elevated in clinic. We have previously discussed that her grief, stress contribute to elevated BP as it has shown minimal change despite multiple medication changes. Please see previous clinic visit note for secondary hypertension workup. Will ask RN staff to add Amlodipine '10mg'$  daily.  _____________________________  She does need prompt evaluation with her primary care provider (and possibly neurology) for workup of possible seizure vs pseudoseizure. Will route to PCP office so they are aware.  [name], NP

## 2022-06-18 NOTE — Telephone Encounter (Signed)
Patient was calling back to after she saw her pcp. Wanted to talk to the nurse about. Please advise

## 2022-06-18 NOTE — Telephone Encounter (Signed)
Call back to patient who wanted to know if she still needed to keep her appt for Friday August 25th at 10:55. Confirmed appt. Pt is feeling better, resting as instructed by her PCP and not driving due to syncopal episodes. Pt states she is drinking plenty of fluids to stay hydrated. Georgana Curio MHA RN CCM

## 2022-06-20 ENCOUNTER — Encounter (HOSPITAL_BASED_OUTPATIENT_CLINIC_OR_DEPARTMENT_OTHER): Payer: Self-pay | Admitting: Family

## 2022-06-20 ENCOUNTER — Other Ambulatory Visit (HOSPITAL_COMMUNITY): Payer: Self-pay

## 2022-06-20 ENCOUNTER — Ambulatory Visit (HOSPITAL_BASED_OUTPATIENT_CLINIC_OR_DEPARTMENT_OTHER): Payer: PPO | Admitting: Family

## 2022-06-20 VITALS — BP 160/90 | HR 80 | Ht 65.0 in | Wt 169.4 lb

## 2022-06-20 DIAGNOSIS — R42 Dizziness and giddiness: Secondary | ICD-10-CM | POA: Diagnosis not present

## 2022-06-20 DIAGNOSIS — I517 Cardiomegaly: Secondary | ICD-10-CM

## 2022-06-20 DIAGNOSIS — E269 Hyperaldosteronism, unspecified: Secondary | ICD-10-CM | POA: Diagnosis not present

## 2022-06-20 DIAGNOSIS — I10 Essential (primary) hypertension: Secondary | ICD-10-CM

## 2022-06-20 DIAGNOSIS — R55 Syncope and collapse: Secondary | ICD-10-CM | POA: Diagnosis not present

## 2022-06-20 DIAGNOSIS — R079 Chest pain, unspecified: Secondary | ICD-10-CM | POA: Diagnosis not present

## 2022-06-20 MED ORDER — SODIUM CHLORIDE 1 G PO TABS
ORAL_TABLET | ORAL | 0 refills | Status: DC
  Filled 2022-06-20: qty 9, 3d supply, fill #0

## 2022-06-20 NOTE — Progress Notes (Signed)
Office Visit    Patient Name: [name] Date of Encounter: 06/20/2022  PCP:  [name], [name], MD   [place]  Cardiologist:  Pixie Casino, MD  Advanced Practice Provider:  No care team member to display Electrophysiologist:  None      Chief Complaint    [name] is a 61 y.o. female  presents today for hypertension  Past Medical History    Past Medical History:  Diagnosis Date   Anxiety    Aortic atherosclerosis ([place]) 04/16/2021   Atypical angina ([place])    Back pain    related to spinal stenosis and disc problem, radiates down left buttocks to leg., weakness occ.   Chest pain    a. 03/2015 Cath: nl cors; b. 03/2021 Cor CTA: Ca2+ = 0. Nl Cors.   Chronic pain syndrome    Dyspnea    GERD (gastroesophageal reflux disease)    Grade I diastolic dysfunction    Headache    Hyperlipidemia    Hypertension    Lumbar post-laminectomy syndrome    LVH (left ventricular hypertrophy) 12/15/2020   a. 11/2020 Echo: EF 65-70%, no rwma, sev asymm LVH with IVSd 1.9 cm. No LVOT obs @ rest. Gr1 DD. Triv MR.   PONV (postoperative nausea and vomiting)    Pulmonary nodules    a. 03/2021 CT Chest: 37m pulm nodules in bilat lower lobes. F/u 1 yr.   Right foot drop    Syncope    a. 11/2020 Zio: No significant arrhythmias.   Vaginal foreign object    "Uses Femring"   Past Surgical History:  Procedure Laterality Date   ABDOMINAL HYSTERECTOMY     ANTERIOR CERVICAL DECOMP/DISCECTOMY FUSION N/A 01/03/2022   Procedure: Anterior Cervical Decompression Fusion - Cervical four-Cervical five - Cervical five-Cervical six;  Surgeon: JEustace Moore MD;  [place] [facility]  Service: Neurosurgery;  Laterality: N/A;   BIOPSY  12/16/2020   Procedure: BIOPSY;  Surgeon: SLadene Artist MD;  [place] [facility]  Service: Endoscopy;;   CARDIAC CATHETERIZATION N/A 04/18/2015   Procedure: Left Heart Cath and Coronary Angiography;  Surgeon: MCharolette Forward  MD;  [facility];  Service: Cardiovascular;  Laterality: N/A;   COLONOSCOPY W/ BIOPSIES AND POLYPECTOMY     ESOPHAGOGASTRODUODENOSCOPY (EGD) WITH PROPOFOL N/A 12/16/2020   Procedure: ESOPHAGOGASTRODUODENOSCOPY (EGD) WITH PROPOFOL;  Surgeon: SLadene Artist MD;  [facility];  Service: Endoscopy;  Laterality: N/A;   FOOT SURGERY Bilateral    [place]"bunion,bone spur, tendon" (1) -6'16, (1)-10'16   HEMATOMA EVACUATION N/A 01/05/2022   Procedure: Cervical Wound Exploration;  Surgeon: CAshok Pall MD;  [place] [facility]  Service: Neurosurgery;  Laterality: N/A;   IR EPIDUROGRAPHY  07/21/2018   LUMBAR LAMINECTOMY/DECOMPRESSION MICRODISCECTOMY Bilateral 12/28/2015   Procedure: MICRO LUMBAR DECOMPRESSION L4 - L5 BILATERALLY;  Surgeon: JSusa Day MD;  [facility];  Service: Orthopedics;  Laterality: Bilateral;   LUMBAR LAMINECTOMY/DECOMPRESSION MICRODISCECTOMY Bilateral 03/04/2018   Procedure: Revision of Microlumbar Decompression Bilateral Lumbar Four-Five;  Surgeon: BSusa Day MD;  [facility] [facility]  Service: Orthopedics;  Laterality: Bilateral;  90 mins   SAVORY DILATION N/A 12/16/2020   Procedure: SAVORY DILATION;  Surgeon: SLadene Artist MD;  [facility];  Service: Endoscopy;  Laterality: N/A;   SPINAL CORD STIMULATOR INSERTION N/A 09/28/2019   Procedure: THORACIC SPINAL CORD STIMULATOR INSERTION;  Surgeon: BMelina Schools MD;  [place] [facility]  Service: Orthopedics;  Laterality: N/A;  2.5 hrs   SPINAL CORD STIMULATOR REMOVAL N/A 05/27/2021   Procedure: LUMBAR SPINAL CORD STIMULATOR REMOVAL;  Surgeon: Deetta Perla, MD;  [facility];  Service: Neurosurgery;  Laterality: N/A;   TUBAL LIGATION     WISDOM TOOTH EXTRACTION     WOUND EXPLORATION N/A 03/04/2018   Procedure: EXPLORATION OF LUMBAR DECOMPRESSION WOUND;  Surgeon: [name], MD;  Location: [place];  Service: Orthopedics;  Laterality: N/A;   Allergies  Allergies  Allergen  Reactions   Cephalosporins Anaphylaxis   Penicillins Anaphylaxis and Hives    Did it involve swelling of the face/tongue/throat, SOB, or low BP? Yes Did it involve sudden or severe rash/hives, skin peeling, or any reaction on the inside of your mouth or nose? Yes Did you need to seek medical attention at a hospital or doctor's office? Yes When did it last happen?      10+ years If all above answers are "NO", may proceed with cephalosporin use.     Anesthetics, Amide Nausea And Vomiting    Does not know name of it. States they put it on record foot center.    Betadine [Povidone Iodine] Other (See Comments)    "Skin Burn" caused scar on Left buttock   Latex Hives, Itching and Rash   Peach [Prunus Persica] Hives   Claritin [Loratadine]     Rash, pruritis     History of Present Illness    [name] is a 61 y.o. female with a hx of HTN, HLD, lumbar back pain, anxiety last seen 06/03/22  Prior cardiac cath 2016 with no coronary disease. 03/2021 cardiac CT With coronary calcium score of 0. 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  She was seen  05/09/22 for elevated BP and chest pain in setting of losing her sister unexpectedly due to stroke. Telmisartan dose increased. Chest pain was atypical for angina and thought to be related to grief, stress. She was also provided Hydralazine '25mg'$  PRN for SBP >180. At follow up 06/03/22 no real improvement in BP Telmisartan stopped to allow for renin-aldosterone level to be checked. Started on Chlorthalidone '25mg'$  QD. Coreg '50mg'$  BID continued. Due to reports of lightheadedness, falls, and questionable syncope ZIO, carotid duplex, echo ordered. Catecholamines, metanephrines unremarkable.  Aldosterone level normal but renin-aldosterone ratio elevated.  However, when she presented for her carotid duplex and echo she became unresponsive with seizure like activity. Transported via EMS to ED but left AMA prior to workup. Had follow up at her PCP office and referred to neurology.   Presents today for follow up. Still grieving loss of her sister, understandably. Shares with me she has been sleeping a lot. Last episode of lightheadedness a few days ago. No recurrent falls nor loss of syncope. Her blood pressure at home has been 160-170 still. Her diastolic blood pressure has been 80s-90s. She does have some left flank pain radiating toward her back. Just started this morning. Discussed that this is atypical for angina and she plans to use heat pack when she gets home. She feels she might have slept on it wrong.   EKGs/Labs/Other Studies Reviewed:   The following studies were reviewed today:  EKG:  EKG is not ordered today.  The ekg independently reviewed from 05/09/22 revealed NSR 79 bpm with no acute  ST/T wave changes.   Recent Labs: 12/31/2021: Hemoglobin 13.1; Platelets 256 04/07/2022: ALT 14 06/06/2022: BUN 8; Creatinine, Ser 1.05; Potassium 4.3; Sodium 142; TSH 2.610  Recent Lipid Panel    Component Value Date/Time   CHOL 187 04/07/2022 0906   TRIG 48.0 04/07/2022 0906   HDL 61.50 04/07/2022 0906   CHOLHDL 3 04/07/2022 0906   VLDL 9.6 04/07/2022 0906   LDLCALC 116 (H) 04/07/2022 0906     Home Medications   Current Meds  Medication Sig   acetaminophen (TYLENOL) 500 MG tablet Take 1,000 mg by mouth every 6 (six) hours as needed for headache.   ALPRAZolam (XANAX) 0.5 MG tablet Take 1/2 to 1 tablet (0.25-0.5 mg total) by mouth at bedtime as needed for anxiety.   amLODipine (NORVASC) 10 MG tablet Take 1 tablet by mouth daily.   atorvastatin (LIPITOR) 20 MG tablet TAKE 1 TABLET BY MOUTH ONCE DAILY   baclofen (LIORESAL) 10 MG tablet TAKE 1 TABLET BY MOUTH THREE TIMES DAILY AS  NEEDED   betamethasone dipropionate 0.05 % lotion Apply a small amount to affected area once a day   busPIRone (BUSPAR) 5 MG tablet Take 1 tablet by mouth 2  times daily. (Patient taking differently: Take 5 mg by mouth 2 (two) times daily as needed (anxiety).)   carvedilol (COREG) 25 MG tablet Take 2 tablets (50 mg total) by mouth 2 (two) times daily with a meal.   cholecalciferol (VITAMIN D3) 25 MCG (1000 UNIT) tablet Take 1,000 Units by mouth daily.   diphenhydrAMINE (BENADRYL) 25 MG tablet Take 25 mg by mouth daily as needed for itching.   Estradiol Acetate (FEMRING) 0.05 MG/24HR RING USE ONE RING VAGINALLY EVERY 3 MONTHS AS DIRECTED   gabapentin (NEURONTIN) 400 MG capsule Take 1 capsule by mouth three times daily.   hydrALAZINE (APRESOLINE) 25 MG tablet Please take every 8 hours as needed for Systolic Blood pressure greater than 180   meclizine (ANTIVERT) 25 MG tablet Take 25 mg by mouth 3 (three) times daily as needed for dizziness.   methocarbamol (ROBAXIN) 500 MG tablet Take 1 tablet by mouth 3 times every day as needed for muscle spasms   neomycin-bacitracin-polymyxin (NEOSPORIN) ointment Apply 1 application topically as needed for wound care.   oxyCODONE-acetaminophen (PERCOCET) 7.5-325 MG tablet Take 1 tablet by mouth daily as needed for severe pain.   spironolactone (ALDACTONE) 25 MG tablet Take 1 tablet (25 mg total) by mouth daily.   traMADol (ULTRAM) 50 MG tablet Take 1 tablet by mouth every 6 (six) hours as needed.     Review of Systems      All other systems reviewed and are otherwise negative except as noted above.  Physical Exam    VS:  BP (!) 162/103   Pulse 80   Ht '5\' 5"'$  (1.651 m)   Wt 169 lb 6.4 oz (76.8 kg)   BMI 28.19 kg/m  , BMI Body mass index is 28.19 kg/m.  Wt Readings from Last 3 Encounters:  06/20/22 169 lb 6.4 oz (76.8 kg)  06/17/22 162 lb 3.2 oz (73.6 kg)  06/03/22 166 lb (75.3 kg)   GEN: Well nourished, well developed, in no acute distress. HEENT:  normal. Neck: Supple, no JVD, carotid bruits, or masses. Cardiac: RRR, no murmurs, rubs, or gallops. No clubbing, cyanosis, edema.  Radials/PT 2+ and equal bilaterally.  Respiratory:  Respirations regular and unlabored, clear to auscultation bilaterally. GI: Soft, nontender, nondistended. MS: No deformity or atrophy. Skin: Warm and dry, no  rash. Neuro:  Strength and sensation are intact. Psych: Normal affect. Tearful.   Assessment & Plan    Chest pain in adult - 03/2021 coronary calcium score 0. Cardiac MRI with no evidence of HCM. Recent chest pain in setting of losing her sister. Episode immediately prior to funeral. Reassurance provided that EKG no acute changes, atypical for angina. No indication for ischemic evaluation.   Grief - Recently loss her sister who was more like a mother to her. Condolences offered. Good support of family and church members. Previously offered referral to psychology which she prefers to think about.   HTN / LVH - BP not at goal but improved. Renin-aldo level elevated, but aldosterone normal. Further evaluation for hyperaldosteronism: hold spironolactone, sodium chloride '1mg'$  TID x 3 days with BMP daily and 24 hr urine on day 3. Metanephrines/catecholamines, TSH unremarkable. Previously tested negative for OSA, no snoring. Continue Coreg '50mg'$  BID, Chlorthalidone '25mg'$  QD. Add Hydralazine '50mg'$  TID. Discussed to monitor BP at home at least 2 hours after medications and sitting for 5-10 minutes. Nurse visit in 2 weeks to reassess BP.  Palpitations / ? Syncope - Completed ZIO. Results processing. Ordered for carotid duplex, echo but had seizure like activity when she was in clinic for these last week. Favor proceeding with neurology evaluation as ordered by PCP. No recurrent near syncope per her report. We will reschedule her echo, carotids.   Disposition: Follow up in 6 weeks with Pixie Casino, MD or APP.  Signed, [name], NP 06/20/2022, 11:03 AM [place]

## 2022-06-20 NOTE — Patient Instructions (Addendum)
Medication Instructions:  Your physician has recommended you make the following change in your medication:   HOLD Spironolactone  CONTINUE Chlorthalidone '25mg'$  daily  CONTINUE Carvedilol '50mg'$  twice daily  START Hydralazine '50mg'$  three times per day  _____________________  To confirm hyperaldosteronism: HOLD Spironolactone Once you get the sodium chloride tablets, take sodium tablet three times per day for 3 days Have a BMP each of those days On the third day you will collect a 24 hour urine sample  *If you need a refill on your cardiac medications before your next appointment, please call your pharmacy*   Lab Work: Your physician recommends that you return for lab work.  Monday, Tuesday, Wednesday have a BMP done daily (This allows [place] to check your potassium levels while on the sodium tablets to assess for hyperaldosteronism).  Please collect a 24 hour urine on Wednesday (the third day of taking your sodium tablets) It is very important to keep this on ice!  If you have labs (blood work) drawn today and your tests are completely normal, you will receive your results only by: [place] (if you have MyChart) OR A paper copy in the mail If you have any lab test that is abnormal or we need to change your treatment, we will call you to review the results.   Testing/Procedures: Echocardioram and carotid duplex as scheduled on September 7th, once these are done you will have a nurse visit to recheck your blood pressure.    Follow-Up: At [facility], you and your health needs are our priority.  As part of our continuing mission to provide you with exceptional heart care, we have created designated Provider Care Teams.  These Care Teams include your primary Cardiologist (physician) and Advanced Practice Providers (APPs -  Physician Assistants and Nurse Practitioners) who all work together to provide you with the care you need, when you need it.  We recommend signing up for  the patient portal called "MyChart".  Sign up information is provided on this After Visit Summary.  MyChart is used to connect with patients for Virtual Visits (Telemedicine).  Patients are able to view lab/test results, encounter notes, upcoming appointments, etc.  Non-urgent messages can be sent to your provider as well.   To learn more about what you can do with MyChart, go to [email or web address].    Your next appointment:   Nurse Visit on September 7th after Echo/ Carotid     and 6 weeks with Dr. Debara Pickett or [name], NP    Other Instructions Tips to Measure your Blood Pressure Correctly  To determine whether you have hypertension, a medical professional will take a blood pressure reading. How you prepare for the test, the position of your arm, and other factors can change a blood pressure reading by 10% or more. That could be enough to hide high blood pressure, start you on a drug you don't really need, or lead your doctor to incorrectly adjust your medications.  National and international guidelines offer specific instructions for measuring blood pressure. If a doctor, nurse, or medical assistant isn't doing it right, don't hesitate to ask him or her to get with the guidelines.  Here's what you can do to ensure a correct reading:  Don't drink a caffeinated beverage or smoke during the 30 minutes before the test.  Sit quietly for five minutes before the test begins.  During the measurement, sit in a chair with your feet on the floor and your arm supported so your elbow  is at about heart level.  The inflatable part of the cuff should completely cover at least 80% of your upper arm, and the cuff should be placed on bare skin, not over a shirt.  Don't talk during the measurement.   Blood pressure categories  Blood pressure category SYSTOLIC (upper number)  DIASTOLIC (lower number)  Normal Less than 120 mm Hg and Less than 80 mm Hg  Elevated 120-129 mm Hg and Less than 80 mm Hg   High blood pressure: Stage 1 hypertension 130-139 mm Hg or 80-89 mm Hg  High blood pressure: Stage 2 hypertension 140 mm Hg or higher or 90 mm Hg or higher  Hypertensive crisis (consult your doctor immediately) Higher than 180 mm Hg and/or Higher than 120 mm Hg  Source: American Heart Association and American Stroke Association. For more on getting your blood pressure under control, buy Controlling Your Blood Pressure, a Special Health Report from [facility].   Blood Pressure Log   Date   Time  Blood Pressure  Example: Nov 1 9 AM 124/78

## 2022-06-23 ENCOUNTER — Telehealth: Payer: Self-pay | Admitting: Internal Medicine

## 2022-06-23 DIAGNOSIS — I10 Essential (primary) hypertension: Secondary | ICD-10-CM | POA: Diagnosis not present

## 2022-06-23 DIAGNOSIS — E269 Hyperaldosteronism, unspecified: Secondary | ICD-10-CM | POA: Diagnosis not present

## 2022-06-23 LAB — BASIC METABOLIC PANEL
BUN/Creatinine Ratio: 9 — ABNORMAL LOW (ref 12–28)
BUN: 10 mg/dL (ref 8–27)
CO2: 29 mmol/L (ref 20–29)
Calcium: 9.6 mg/dL (ref 8.7–10.3)
Chloride: 104 mmol/L (ref 96–106)
Creatinine, Ser: 1.14 mg/dL — ABNORMAL HIGH (ref 0.57–1.00)
Glucose: 86 mg/dL (ref 70–99)
Potassium: 4.6 mmol/L (ref 3.5–5.2)
Sodium: 143 mmol/L (ref 134–144)
eGFR: 55 mL/min/{1.73_m2} — ABNORMAL LOW (ref >59)

## 2022-06-23 NOTE — Telephone Encounter (Signed)
Please see AVS notes.  She takes sodium chloride TID x 3 days. Each of those 3 days she has a BMP. On day 3, she does her 24 hour urine.   [name], NP

## 2022-06-23 NOTE — Telephone Encounter (Signed)
Pt c/o medication issue:  1. Name of Medication: sodium chloride 1 g tablet  2. How are you currently taking this medication (dosage and times per day)?   3. Are you having a reaction (difficulty breathing--STAT)?   4. What is your medication issue?  Pt would like to know if she should wait until after labs to start this med or if she should go ahead and take and then head over to do labs. Please advise.

## 2022-06-23 NOTE — Telephone Encounter (Signed)
RN returned call to patient and provided the following information to the patient. Patient verbalizes understanding.    "Please see AVS notes.   She takes sodium chloride TID x 3 days. Each of those 3 days she has a BMP. On day 3, she does her 24 hour urine.    [name], NP "

## 2022-06-24 ENCOUNTER — Telehealth: Payer: Self-pay | Admitting: Family

## 2022-06-24 DIAGNOSIS — E269 Hyperaldosteronism, unspecified: Secondary | ICD-10-CM | POA: Diagnosis not present

## 2022-06-24 DIAGNOSIS — I10 Essential (primary) hypertension: Secondary | ICD-10-CM | POA: Diagnosis not present

## 2022-06-24 LAB — BASIC METABOLIC PANEL
BUN/Creatinine Ratio: 9 — ABNORMAL LOW (ref 12–28)
BUN: 10 mg/dL (ref 8–27)
CO2: 26 mmol/L (ref 20–29)
Calcium: 9.3 mg/dL (ref 8.7–10.3)
Chloride: 104 mmol/L (ref 96–106)
Creatinine, Ser: 1.17 mg/dL — ABNORMAL HIGH (ref 0.57–1.00)
Glucose: 96 mg/dL (ref 70–99)
Potassium: 4.5 mmol/L (ref 3.5–5.2)
Sodium: 143 mmol/L (ref 134–144)
eGFR: 53 mL/min/{1.73_m2} — ABNORMAL LOW (ref >59)

## 2022-06-24 NOTE — Telephone Encounter (Signed)
Pt would like a callback regarding lab orders. She has questions. Please advise

## 2022-06-24 NOTE — Telephone Encounter (Signed)
Patient went to get her second labs and the lab tech told her she would need separate orders for her BMP and 24 hr urine. RN ordered new labs, informed patient

## 2022-06-25 ENCOUNTER — Encounter: Payer: Self-pay | Admitting: Physical Medicine & Rehabilitation

## 2022-06-25 ENCOUNTER — Encounter: Payer: PPO | Attending: Physical Medicine & Rehabilitation | Admitting: Physical Medicine & Rehabilitation

## 2022-06-25 ENCOUNTER — Inpatient Hospital Stay: Payer: PPO | Admitting: Family Medicine

## 2022-06-25 VITALS — BP 125/77 | HR 84 | Ht 65.0 in | Wt 168.0 lb

## 2022-06-25 DIAGNOSIS — E269 Hyperaldosteronism, unspecified: Secondary | ICD-10-CM | POA: Diagnosis not present

## 2022-06-25 DIAGNOSIS — G8220 Paraplegia, unspecified: Secondary | ICD-10-CM | POA: Diagnosis not present

## 2022-06-25 DIAGNOSIS — M5416 Radiculopathy, lumbar region: Secondary | ICD-10-CM | POA: Diagnosis not present

## 2022-06-25 LAB — BASIC METABOLIC PANEL
BUN/Creatinine Ratio: 8 — ABNORMAL LOW (ref 12–28)
BUN: 9 mg/dL (ref 8–27)
CO2: 29 mmol/L (ref 20–29)
Calcium: 9.6 mg/dL (ref 8.7–10.3)
Chloride: 104 mmol/L (ref 96–106)
Creatinine, Ser: 1.08 mg/dL — ABNORMAL HIGH (ref 0.57–1.00)
Glucose: 86 mg/dL (ref 70–99)
Potassium: 5 mmol/L (ref 3.5–5.2)
Sodium: 142 mmol/L (ref 134–144)
eGFR: 59 mL/min/{1.73_m2} — ABNORMAL LOW (ref >59)

## 2022-06-25 NOTE — Progress Notes (Signed)
Subjective:    Patient ID: [name], female    DOB: [birth year], 61 y.o.   MRN: [id number]  HPI  [name] is here in follow up of her chronic gait and pain issues. She had a C4-5, C5-6 ACDF in March, and she's had good results with it experiencing improvement in her left arm pain and weakness. Her neck is still a little stiff. She has been experiencing some "black out" episodes recently, the first one was a month ago when she got up late at night to use the bathroom. She went down without trying to protect herself, and she really doesn't remember much about what happened. She also passed out while at cardiology appointment last week but recovered quickly after she received an IV. She hasn't experienced any other symptoms.   Her anxiety is better. She is now essentially only taking xanax. She has buspar prn.  .   Pain Inventory Average Pain 4 Pain Right Now 4 My pain is sharp and aching  In the last 24 hours, has pain interfered with the following? General activity 4 Relation with others 3 Enjoyment of life 3 What TIME of day is your pain at its worst? morning , daytime, and evening Sleep (in general) Fair  Pain is worse with: bending, sitting, standing, and some activites Pain improves with: rest, heat/ice, therapy/exercise, pacing activities, and medication Relief from Meds: 7  Family History  Problem Relation Age of Onset   Heart attack Mother    Lung cancer Father    Cancer Father    Pancreatic cancer Sister    Breast cancer Sister 58   Throat cancer Brother    Multiple myeloma Sister    Breast cancer Sister        diagnosed in her 42's   Heart attack Sister    Stomach cancer Cousin    Colon cancer Neg Hx    Social History   Socioeconomic History   Marital status: Married    Spouse name: Nelida Gores   Number of children: 1   Years of education: Not on file   Highest education level: Not on file  Occupational History   Not on file  Tobacco Use    Smoking status: Never   Smokeless tobacco: Never  Vaping Use   Vaping Use: Never used  Substance and Sexual Activity   Alcohol use: No   Drug use: No   Sexual activity: Yes    Partners: Male    Birth control/protection: Post-menopausal  Other Topics Concern   Not on file  Social History Narrative   Not on file   Social Determinants of Health   Financial Resource Strain: Low Risk  (09/12/2021)   Overall Financial Resource Strain (CARDIA)    Difficulty of Paying Living Expenses: Not hard at all  Food Insecurity: No Food Insecurity (09/12/2021)   Hunger Vital Sign    Worried About Running Out of Food in the Last Year: Never true    [place] in the Last Year: Never true  Transportation Needs: No Transportation Needs (09/12/2021)   PRAPARE - Hydrologist (Medical): No    Lack of Transportation (Non-Medical): No  Physical Activity: Insufficiently Active (09/12/2021)   Exercise Vital Sign    Days of Exercise per Week: 2 days    Minutes of Exercise per Session: 40 min  Stress: No Stress Concern Present (09/12/2021)   [place]  Feeling of Stress : Not at all  Social Connections: Socially Integrated (09/12/2021)   Social Connection and Isolation Panel [NHANES]    Frequency of Communication with Friends and Family: More than three times a week    Frequency of Social Gatherings with Friends and Family: Twice a week    Attends Religious Services: 1 to 4 times per year    Active Member of [employer] or Organizations: Yes    Attends Archivist Meetings: 1 to 4 times per year    Marital Status: Married   Past Surgical History:  Procedure Laterality Date   ABDOMINAL HYSTERECTOMY     ANTERIOR CERVICAL DECOMP/DISCECTOMY FUSION N/A 01/03/2022   Procedure: Anterior Cervical Decompression Fusion - Cervical four-Cervical five - Cervical five-Cervical six;  Surgeon: Eustace Moore, MD;   Location: [place];  Service: Neurosurgery;  Laterality: N/A;   BIOPSY  12/16/2020   Procedure: BIOPSY;  Surgeon: Ladene Artist, MD;  Location: [place];  Service: Endoscopy;;   CARDIAC CATHETERIZATION N/A 04/18/2015   Procedure: Left Heart Cath and Coronary Angiography;  Surgeon: [name], MD;  Location: [place] [facility];  Service: Cardiovascular;  Laterality: N/A;   COLONOSCOPY W/ BIOPSIES AND POLYPECTOMY     ESOPHAGOGASTRODUODENOSCOPY (EGD) WITH PROPOFOL N/A 12/16/2020   Procedure: ESOPHAGOGASTRODUODENOSCOPY (EGD) WITH PROPOFOL;  Surgeon: Ladene Artist, MD;  Location[facility] ENDOSCOPY;  Service: Endoscopy;  Laterality: N/A;   FOOT SURGERY Bilateral    [place] "bunion,bone spur, tendon" (1) -6'16, (1)-10'16   HEMATOMA EVACUATION N/A 01/05/2022   Procedure: Cervical Wound Exploration;  Surgeon: Ashok Pall, MD;  Location: [place];  Service: Neurosurgery;  Laterality: N/A;   IR EPIDUROGRAPHY  07/21/2018   LUMBAR LAMINECTOMY/DECOMPRESSION MICRODISCECTOMY Bilateral 12/28/2015   Procedure: MICRO LUMBAR DECOMPRESSION L4 - L5 BILATERALLY;  Surgeon: [name], MD;  Location: [facility];  Service: Orthopedics;  Laterality: Bilateral;   LUMBAR LAMINECTOMY/DECOMPRESSION MICRODISCECTOMY Bilateral 03/04/2018   Procedure: Revision of Microlumbar Decompression Bilateral Lumbar Four-Five;  Surgeon: [name], MD;  [facility];  Service: Orthopedics;  Laterality: Bilateral;  90 mins   SAVORY DILATION N/A 12/16/2020   Procedure: SAVORY DILATION;  Surgeon: Ladene Artist, MD;  Location: [facility] ENDOSCOPY;  Service: Endoscopy;  Laterality: N/A;   SPINAL CORD STIMULATOR INSERTION N/A 09/28/2019   Procedure: THORACIC SPINAL CORD STIMULATOR INSERTION;  Surgeon: Melina Schools, MD;  Location: [place];  Service: Orthopedics;  Laterality: N/A;  2.5 hrs   SPINAL CORD STIMULATOR REMOVAL N/A 05/27/2021   Procedure: LUMBAR SPINAL CORD STIMULATOR REMOVAL;  Surgeon: Deetta Perla, MD;  Location: [facility];   Service: Neurosurgery;  Laterality: N/A;   TUBAL LIGATION     WISDOM TOOTH EXTRACTION     WOUND EXPLORATION N/A 03/04/2018   Procedure: EXPLORATION OF LUMBAR DECOMPRESSION WOUND;  Surgeon: [name], MD;  Location: [place];  Service: Orthopedics;  Laterality: N/A;   Past Surgical History:  Procedure Laterality Date   ABDOMINAL HYSTERECTOMY     ANTERIOR CERVICAL DECOMP/DISCECTOMY FUSION N/A 01/03/2022   Procedure: Anterior Cervical Decompression Fusion - Cervical four-Cervical five - Cervical five-Cervical six;  Surgeon: Eustace Moore, MD;  Location: [place];  Service: Neurosurgery;  Laterality: N/A;   BIOPSY  12/16/2020   Procedure: BIOPSY;  Surgeon: Ladene Artist, MD;  Location: [place];  Service: Endoscopy;;   CARDIAC CATHETERIZATION N/A 04/18/2015   Procedure: Left Heart Cath and Coronary Angiography;  Surgeon: [name], MD;  Location: [place] [facility];  Service: Cardiovascular;  Laterality: N/A;   COLONOSCOPY W/ BIOPSIES AND POLYPECTOMY     ESOPHAGOGASTRODUODENOSCOPY (EGD) WITH PROPOFOL N/A 12/16/2020   Procedure: ESOPHAGOGASTRODUODENOSCOPY (EGD) WITH PROPOFOL;  Surgeon: Ladene Artist, MD;  Location: [facility] ENDOSCOPY;  Service: Endoscopy;  Laterality: N/A;   FOOT SURGERY Bilateral    [place] "bunion,bone spur, tendon" (1) -6'16, (1)-10'16   HEMATOMA EVACUATION N/A 01/05/2022   Procedure: Cervical Wound Exploration;  Surgeon: Ashok Pall, MD;  Location: [place];  Service: Neurosurgery;  Laterality: N/A;   IR EPIDUROGRAPHY  07/21/2018   LUMBAR LAMINECTOMY/DECOMPRESSION MICRODISCECTOMY Bilateral 12/28/2015   Procedure: MICRO LUMBAR DECOMPRESSION L4 - L5 BILATERALLY;  Surgeon: [name], MD;  Location: [facility];  Service: Orthopedics;  Laterality: Bilateral;   LUMBAR LAMINECTOMY/DECOMPRESSION MICRODISCECTOMY Bilateral 03/04/2018   Procedure: Revision of Microlumbar Decompression Bilateral Lumbar Four-Five;  Surgeon: [name], MD;  Location: [place];  Service:  Orthopedics;  Laterality: Bilateral;  90 mins   SAVORY DILATION N/A 12/16/2020   Procedure: SAVORY DILATION;  Surgeon: Ladene Artist, MD;  Location: [facility] ENDOSCOPY;  Service: Endoscopy;  Laterality: N/A;   SPINAL CORD STIMULATOR INSERTION N/A 09/28/2019   Procedure: THORACIC SPINAL CORD STIMULATOR INSERTION;  Surgeon: Melina Schools, MD;  Location: [place];  Service: Orthopedics;  Laterality: N/A;  2.5 hrs   SPINAL CORD STIMULATOR REMOVAL N/A 05/27/2021   Procedure: LUMBAR SPINAL CORD STIMULATOR REMOVAL;  Surgeon: Deetta Perla, MD;  Location: [facility];  Service: Neurosurgery;  Laterality: N/A;   TUBAL LIGATION     WISDOM TOOTH EXTRACTION     WOUND EXPLORATION N/A 03/04/2018   Procedure: EXPLORATION OF LUMBAR DECOMPRESSION WOUND;  Surgeon: [name], MD;  Location: [place];  Service: Orthopedics;  Laterality: N/A;   Past Medical History:  Diagnosis Date   Anxiety    Aortic atherosclerosis ([place]) 04/16/2021   Atypical angina (HCC)    Back pain    related to spinal stenosis and disc problem, radiates down left buttocks to leg., weakness occ.   Chest pain    a. 03/2015 Cath: nl cors; b. 03/2021 Cor CTA: Ca2+ = 0. Nl Cors.   Chronic pain syndrome    Dyspnea    GERD (gastroesophageal reflux disease)    Grade I diastolic dysfunction    Headache    Hyperlipidemia    Hypertension    Lumbar post-laminectomy syndrome    LVH (left ventricular hypertrophy) 12/15/2020   a. 11/2020 Echo: EF 65-70%, no rwma, sev asymm LVH with IVSd 1.9 cm. No LVOT obs @ rest. Gr1 DD. Triv MR.   PONV (postoperative nausea and vomiting)    Pulmonary nodules    a. 03/2021 CT Chest: 23m pulm nodules in bilat lower lobes. F/u 1 yr.   Right foot drop    Syncope    a. 11/2020 Zio: No significant arrhythmias.   Vaginal foreign object    "Uses Femring"   BP 125/77   Pulse 84   Ht 5' 5" (1.651 m)   Wt 168 lb (76.2 kg) Comment: per patient  SpO2 97%   BMI 27.96 kg/m   Opioid Risk Score:   Fall Risk Score:   `1  [facility]     04/07/2022    8:38 AM 11/27/2021    9:50 AM 10/04/2021    8:35 AM 09/12/2021   11:26 AM 07/31/2021   10:09 AM 04/22/2020    8:04 PM 08/17/2018   12:00 PM  Depression screen PHQ 2/9  Decreased Interest 0 0 0 0 0 2  0  Down, Depressed, Hopeless 1 0 0 0 0 0 0  PHQ - 2 Score 1 0 0 0 0 2 0  Altered sleeping 1  0   2   Tired, decreased energy 1  0   2   Change in appetite 1  0   1   Feeling bad or failure about yourself  0  0   0   Trouble concentrating 0  0   2   Moving slowly or fidgety/restless 0  0   1   Suicidal thoughts 0  0   0   PHQ-9 Score 4  0   10   Difficult doing work/chores Somewhat difficult     Very difficult      Review of Systems  Constitutional: Negative.   HENT: Negative.    Eyes: Negative.   Respiratory: Negative.    Cardiovascular: Negative.   Gastrointestinal: Negative.   Endocrine: Negative.   Genitourinary: Negative.   Musculoskeletal:  Positive for back pain and gait problem.  Skin: Negative.   Allergic/Immunologic: Negative.   Hematological: Negative.   Psychiatric/Behavioral:  The patient is nervous/anxious.       Objective:   Physical Exam  General: No acute distress HEENT: NCAT, EOMI, oral membranes moist Cards: reg rate  Chest: normal effort Abdomen: Soft, NT, ND Skin: dry, intact Extremities: no edema Psych: pleasant and appropriate  Skin: intact.  Surgical sites CDI Neuro: RLE 2/5 to 3/5... LLE 4/5. Sensory loss distally bilaterally.  Upper extremities are 4+ to 5 out of 5 on the right and  4 out of 5 on the left with some pain inhibition.  Improved left arm movement Musculoskeletal: neck rom sl reduced esp ext. Pain improved but still some tenderness with flexion,    Assessment & Plan:  1. Post Lumbar Laminectomy / Decompression: Lumbar Radiculopathy:             -gabapentin continue at 346m tid             -tramadol  for breakthrough pain.  she will check into PCP rxing. If not we can prescribe.                           2. New onset left BPPV-improved  -more recent incidents seem bp/volume related  -discussed being aware of safety, intake, etc  -acclimate before getting out of bed. needs to use common sense 3. Anxiety. PCP Following and prescribing Alprazolam.               -buspar, xanax prn per primary 4. Spasms: may continue robaxin 5. Orthotics:             -advised pt to call [employer] about application of T-strap 6.  Cervical spondylosis with radiculopath at C6.      -improved after C4-5, C5-6 ACDF!   30 minutes of face to face patient care time were spent during this visit. All questions were encouraged and answered.  Follow up with me in 6 mos .

## 2022-06-25 NOTE — Patient Instructions (Signed)
PLEASE FEEL FREE TO CALL OUR OFFICE WITH ANY PROBLEMS OR QUESTIONS ([phone])      

## 2022-06-26 DIAGNOSIS — E269 Hyperaldosteronism, unspecified: Secondary | ICD-10-CM | POA: Diagnosis not present

## 2022-06-27 ENCOUNTER — Other Ambulatory Visit (HOSPITAL_COMMUNITY): Payer: Self-pay

## 2022-06-27 DIAGNOSIS — R208 Other disturbances of skin sensation: Secondary | ICD-10-CM | POA: Diagnosis not present

## 2022-06-27 DIAGNOSIS — L298 Other pruritus: Secondary | ICD-10-CM | POA: Diagnosis not present

## 2022-06-27 LAB — SODIUM, URINE, 24 HOUR
Sodium, 24H Ur: 99 mmol/24 hr (ref 39–258)
Sodium, Ur: 93 mmol/L

## 2022-06-27 LAB — POTASSIUM, URINE, 24 HOUR
Potassium Urine: 30.1 mmol/L
Potassium, Urine: 32 mmol/24 hr (ref 14–95)

## 2022-06-27 MED ORDER — BETAMETHASONE DIPROPIONATE 0.05 % EX OINT
TOPICAL_OINTMENT | CUTANEOUS | 3 refills | Status: DC
  Filled 2022-06-27: qty 45, 30d supply, fill #0
  Filled 2022-07-12 – 2022-07-21 (×2): qty 45, 30d supply, fill #1
  Filled 2022-08-22: qty 45, 30d supply, fill #2
  Filled 2022-10-08: qty 45, 30d supply, fill #3

## 2022-07-01 LAB — CREATINE, 24-HOUR URINE

## 2022-07-03 ENCOUNTER — Ambulatory Visit (INDEPENDENT_AMBULATORY_CARE_PROVIDER_SITE_OTHER): Payer: PPO

## 2022-07-03 ENCOUNTER — Encounter (HOSPITAL_BASED_OUTPATIENT_CLINIC_OR_DEPARTMENT_OTHER): Payer: Self-pay

## 2022-07-03 VITALS — BP 131/81 | HR 86 | Ht 65.0 in

## 2022-07-03 DIAGNOSIS — I10 Essential (primary) hypertension: Secondary | ICD-10-CM

## 2022-07-03 DIAGNOSIS — R42 Dizziness and giddiness: Secondary | ICD-10-CM | POA: Diagnosis not present

## 2022-07-03 DIAGNOSIS — I517 Cardiomegaly: Secondary | ICD-10-CM | POA: Diagnosis not present

## 2022-07-03 NOTE — Progress Notes (Signed)
   Nurse Visit   Date of Encounter: 07/03/2022 ID: [name], DOB 1960-11-10, MRN [id number]  PCP:  [name], [name], [place] [facility] Cardiologist:  Pixie Casino, MD      Visit Details   VS:  BP 131/81   Pulse 86   Ht '5\' 5"'$  (1.651 m)   BMI 27.96 kg/m  , BMI Body mass index is 27.96 kg/m.  Wt Readings from Last 3 Encounters:  06/25/22 168 lb (76.2 kg)  06/20/22 169 lb 6.4 oz (76.8 kg)  06/17/22 162 lb 3.2 oz (73.6 kg)     Reason for visit: Blood Pressure Check In  Performed today: Vitals, Provider consulted:Caitlin Walker, NP , and Education Changes (medications, testing, etc.) : No changes made today Length of Visit: 10 minutes    Medications Adjustments/Labs and Tests Ordered: No orders of the defined types were placed in this encounter.  No orders of the defined types were placed in this encounter.    Patient presents today for a blood pressure check in. Overall blood pressure is trending downward in the 120-130/80s. At this time patient is tolerating medication well. We will not make any changes at this time. Patient is very appreciative of her visit today. We will see her back in October for her follow up visit.  Signed, Gerald Stabs, RN  07/03/2022 12:33 PM

## 2022-07-03 NOTE — Patient Instructions (Signed)
Medication Instructions:  Your Physician recommend you continue on your current medication as directed.    *If you need a refill on your cardiac medications before your next appointment, please call your pharmacy*  Follow-Up: At [facility], you and your health needs are our priority.  As part of our continuing mission to provide you with exceptional heart care, we have created designated Provider Care Teams.  These Care Teams include your primary Cardiologist (physician) and Advanced Practice Providers (APPs -  Physician Assistants and Nurse Practitioners) who all work together to provide you with the care you need, when you need it.  We recommend signing up for the patient portal called "MyChart".  Sign up information is provided on this After Visit Summary.  MyChart is used to connect with patients for Virtual Visits (Telemedicine).  Patients are able to view lab/test results, encounter notes, upcoming appointments, etc.  Non-urgent messages can be sent to your provider as well.   To learn more about what you can do with MyChart, go to [email or web address].    Your next appointment:   Follow up as scheduled with Laurann Montana, NP   Other Instructions Continue checking blood pressure 2-3 times per week 1-2 hours after medication. If the blood pressure for more than 3 days in a row goes back above 135/90 please call us!   Important Information About Sugar

## 2022-07-04 ENCOUNTER — Telehealth (HOSPITAL_BASED_OUTPATIENT_CLINIC_OR_DEPARTMENT_OTHER): Payer: Self-pay

## 2022-07-04 DIAGNOSIS — R42 Dizziness and giddiness: Secondary | ICD-10-CM

## 2022-07-04 LAB — ECHOCARDIOGRAM COMPLETE
Area-P 1/2: 3.99 cm2
Height: 65 in
S' Lateral: 1.82 cm

## 2022-07-04 NOTE — Telephone Encounter (Addendum)
Results called to patient who verbalizes understanding!  Patient was very appreciative of the call.      ----- Message from [name], NP sent at 07/04/2022 11:54 AM EDT ----- Echo with normal heart pumping function. Moderate thickening of the heart muscle. No significant valvular abnormalities. Overall a great result! Continue optimal BP control to prevent progression of thickened heart muscle.

## 2022-07-04 NOTE — Telephone Encounter (Addendum)
Results called to patient who verbalizes understanding! Ordered repeat testing!    ----- Message from [name], NP sent at 07/04/2022  8:05 AM EDT ----- Left carotid artery with 1-39% stenosis.  Continue atorvastatin to prevent progression.  No significant stenosis cervical symptoms.  Repeat carotid duplex in 1 year for monitoring.

## 2022-07-12 ENCOUNTER — Other Ambulatory Visit (HOSPITAL_COMMUNITY): Payer: Self-pay

## 2022-07-14 ENCOUNTER — Other Ambulatory Visit (HOSPITAL_COMMUNITY): Payer: Self-pay

## 2022-07-15 ENCOUNTER — Telehealth (HOSPITAL_BASED_OUTPATIENT_CLINIC_OR_DEPARTMENT_OTHER): Payer: Self-pay

## 2022-07-15 NOTE — Telephone Encounter (Addendum)
Results called to patient who verbalizes understanding!    ----- Message from [name], NP sent at 07/14/2022  1:09 PM EDT ----- Normal 24 hr sodium and potassium. Good result. No evidence of hyperaldosteronism. Continue current medications.

## 2022-07-16 ENCOUNTER — Ambulatory Visit: Payer: PPO | Admitting: Neurology

## 2022-07-19 ENCOUNTER — Other Ambulatory Visit (HOSPITAL_COMMUNITY): Payer: Self-pay

## 2022-07-21 ENCOUNTER — Ambulatory Visit (INDEPENDENT_AMBULATORY_CARE_PROVIDER_SITE_OTHER): Payer: PPO

## 2022-07-21 ENCOUNTER — Other Ambulatory Visit (HOSPITAL_COMMUNITY): Payer: Self-pay

## 2022-07-21 DIAGNOSIS — Z23 Encounter for immunization: Secondary | ICD-10-CM | POA: Diagnosis not present

## 2022-07-21 DIAGNOSIS — F330 Major depressive disorder, recurrent, mild: Secondary | ICD-10-CM | POA: Diagnosis not present

## 2022-07-21 DIAGNOSIS — I10 Essential (primary) hypertension: Secondary | ICD-10-CM | POA: Diagnosis not present

## 2022-07-22 ENCOUNTER — Other Ambulatory Visit (HOSPITAL_COMMUNITY): Payer: Self-pay

## 2022-07-22 DIAGNOSIS — Z6828 Body mass index (BMI) 28.0-28.9, adult: Secondary | ICD-10-CM | POA: Diagnosis not present

## 2022-07-22 DIAGNOSIS — M5412 Radiculopathy, cervical region: Secondary | ICD-10-CM | POA: Diagnosis not present

## 2022-07-26 ENCOUNTER — Other Ambulatory Visit (HOSPITAL_COMMUNITY): Payer: Self-pay

## 2022-07-29 ENCOUNTER — Other Ambulatory Visit: Payer: Self-pay | Admitting: Obstetrics

## 2022-07-29 DIAGNOSIS — Z1231 Encounter for screening mammogram for malignant neoplasm of breast: Secondary | ICD-10-CM

## 2022-08-01 ENCOUNTER — Encounter: Payer: Self-pay | Admitting: Gastroenterology

## 2022-08-01 ENCOUNTER — Ambulatory Visit (HOSPITAL_BASED_OUTPATIENT_CLINIC_OR_DEPARTMENT_OTHER): Payer: PPO | Admitting: Family

## 2022-08-04 ENCOUNTER — Ambulatory Visit: Payer: PPO | Admitting: Neurology

## 2022-08-04 ENCOUNTER — Encounter: Payer: Self-pay | Admitting: Neurology

## 2022-08-04 ENCOUNTER — Ambulatory Visit (INDEPENDENT_AMBULATORY_CARE_PROVIDER_SITE_OTHER): Payer: PPO | Admitting: Neurology

## 2022-08-04 VITALS — BP 130/81 | HR 65 | Ht 65.0 in | Wt 171.0 lb

## 2022-08-04 DIAGNOSIS — R55 Syncope and collapse: Secondary | ICD-10-CM

## 2022-08-04 NOTE — Patient Instructions (Signed)
Routine EEG  Will contact you to go over the results  Continue to follow up with PCP  Resume PT for the right foot drop  Return as needed

## 2022-08-04 NOTE — Progress Notes (Signed)
Chronic Care Management Pharmacy Note  08/05/2022 Name:  [name] MRN:  [id number] DOB:  [birth year]  Summary: LDL not at goal < 100 Pt reports she is needing Xanax more often Carvedilol is above recommended dose for BP  Recommendations/Changes made from today's visit: -Recommended for patient to restart taking buspirone twice daily as prescribed -Recommend increasing atorvastatin to 40 mg as LDL is not < 100 -Recommended decreasing dose of carvedilol given dosed beyond maximum recommended dose  Plan: Anxiety assessment in 3-4 weeks Follow up in 4 months  Subjective: [name] is an 61 y.o. year old female who is a primary patient of [name], Malka So, MD.  The CCM team was consulted for assistance with disease management and care coordination needs.    Engaged with patient by telephone for follow up visit in response to provider referral for pharmacy case management and/or care coordination services.   Consent to Services:  The patient was given information about Chronic Care Management services, agreed to services, and gave verbal consent prior to initiation of services.  Please see initial visit note for detailed documentation.   Patient Care Team: [name], [name], MD as PCP - General (Family Medicine) Debara Pickett Nadean Corwin, MD as PCP - Cardiology (Cardiology) [name], MD as Consulting Physician (Orthopedic Surgery) Viona Gilmore, [facility] as Pharmacist (Pharmacist)  Recent office visits: 06/17/22 Carolann Littler, MD: Patient presented for hospital follow up. Recommended follow up with cardiology and referred to neurology.  04/07/22 [name], [name], MD - Patient presented for Routine General medical exam at a health care facility and other concerns. No medication changes.  Recent consult visits: 08/04/22 Alric Ran, MD (neurology): Patient presented for initial visit for syncope.  Plan for EEG. Resume PT for right foot drop.  07/03/22 Elonda Husky, RN (cardiology): Patient presented for BP check in.    06/25/22 Alger Simons, MD (physical medicine & rehab): Patient presented for lumbar radiculopathy. Follow up in 4 months.  06/20/22 Laurann Montana, NP (cardiology): Patient presented for HTN follow up.  Stopped spironolactone and added hydralazine 50 mg TID. Follow up in 6 weeks.  06/03/22 Laurann Montana, NP (cardiology): Patient presented for HTN follow up. Stopped telmisartan. Prescribed chlorthalidone 25 mg daily. Plan for 7 day Zio and follow up in 2 weeks.  05/09/22 [name], NP (Cardiology) - Patient presented for Essential Hypertension. Increased Telmisartan.   03/27/22 [name] (Dermatology) - Claims encounter for Other cicatricial alopecia. No other visit details available.  Hospital visits: Medication Reconciliation was completed by comparing discharge summary, patient's EMR and Pharmacy list, and upon discussion with patient.   Patient presented to [facility] ED on 06/16/22 due to Loss of consciousness. Patient was present for 1 hour.   New?Medications Started at [facility] Discharge:?? -started  None   Medication Changes at Hospital Discharge: -Changed  None   Medications Discontinued at Hospital Discharge: -Stopped  None   Medications that remain the same after Hospital Discharge:??  -All other medications will remain the same.  Objective:  Lab Results  Component Value Date   CREATININE 1.08 (H) 06/25/2022   BUN 9 06/25/2022   GFR 51.72 (L) 04/07/2022   GFRNONAA 58 (L) 12/31/2021   GFRAA >60 09/23/2019   NA 142 06/25/2022   K 5.0 06/25/2022   CALCIUM 9.6 06/25/2022   CO2 29 06/25/2022   GLUCOSE 86 06/25/2022    Lab Results  Component Value Date/Time   HGBA1C 5.5 03/06/2018 03:26 AM  HGBA1C  06/01/2007 02:05 PM    5.4 (NOTE)   The ADA recommends the following therapeutic goals for glycemic   control related to Hgb A1C measurement:   Goal of Therapy:   < 7.0% Hgb  A1C   Action Suggested:  > 8.0% Hgb A1C   Ref:  Diabetes Care, 22, Suppl. 1, 1999   GFR 51.72 (L) 04/07/2022 09:06 AM   GFR 56.92 (L) 12/14/2020 10:10 AM    Last diabetic Eye exam: No results found for: "HMDIABEYEEXA"  Last diabetic Foot exam: No results found for: "HMDIABFOOTEX"   Lab Results  Component Value Date   CHOL 187 04/07/2022   HDL 61.50 04/07/2022   LDLCALC 116 (H) 04/07/2022   TRIG 48.0 04/07/2022   CHOLHDL 3 04/07/2022       Latest Ref Rng & Units 04/07/2022    9:06 AM 05/13/2021    6:42 AM 04/20/2020    9:55 AM  Hepatic Function  Total Protein 6.0 - 8.3 g/dL 7.2  6.9  6.6   Albumin 3.5 - 5.2 g/dL 4.1  3.6  4.1   AST 0 - 37 U/L $Remo'18  20  16   'Gfzsa$ ALT 0 - 35 U/L $Remo'14  17  19   'mPsNc$ Alk Phosphatase 39 - 117 U/L 70  52  70   Total Bilirubin 0.2 - 1.2 mg/dL 0.4  0.5  0.4   Bilirubin, Direct 0.0 - 0.3 mg/dL   0.1     Lab Results  Component Value Date/Time   TSH 3.361 05/13/2021 06:42 AM   TSH 3.111 12/14/2020 09:05 PM   TSH 1.80 04/01/2019 03:35 PM   TSH 5.749 Test methodology is 3rd generation TSH(H) 05/31/2007 10:30 PM   FREET4 0.87 05/13/2021 06:42 AM   FREET4 1.02 12/03/2017 11:00 AM       Latest Ref Rng & Units 12/31/2021    9:31 AM 05/29/2021    8:57 AM 05/14/2021    5:39 AM  CBC  WBC 4.0 - 10.5 K/uL 5.1  12.8  4.1   Hemoglobin 12.0 - 15.0 g/dL 13.1  11.1  10.8   Hematocrit 36.0 - 46.0 % 39.6  33.7  32.0   Platelets 150 - 400 K/uL 256  242  241     Lab Results  Component Value Date/Time   VD25OH 29.5 (L) 12/03/2017 11:00 AM    Clinical ASCVD: No  The 10-year ASCVD risk score (Arnett DK, et al., 2019) is: 6.3%   Values used to calculate the score:     Age: 14 years     Sex: Female     Is Non-Hispanic African American: Yes     Diabetic: No     Tobacco smoker: No     Systolic Blood Pressure: 623 mmHg     Is BP treated: Yes     HDL Cholesterol: 61.5 mg/dL     Total Cholesterol: 187 mg/dL       04/07/2022    8:38 AM 11/27/2021    9:50 AM 10/04/2021    8:35  AM  Depression screen PHQ 2/9  Decreased Interest 0 0 0  Down, Depressed, Hopeless 1 0 0  PHQ - 2 Score 1 0 0  Altered sleeping 1  0  Tired, decreased energy 1  0  Change in appetite 1  0  Feeling bad or failure about yourself  0  0  Trouble concentrating 0  0  Moving slowly or fidgety/restless 0  0  Suicidal thoughts 0  0  PHQ-9 Score 4  0  Difficult doing work/chores Somewhat difficult        Social History   Tobacco Use  Smoking Status Never  Smokeless Tobacco Never   BP Readings from Last 3 Encounters:  08/04/22 130/81  07/03/22 131/81  06/25/22 125/77   Pulse Readings from Last 3 Encounters:  08/04/22 65  07/03/22 86  06/25/22 84   Wt Readings from Last 3 Encounters:  08/04/22 171 lb (77.6 kg)  06/25/22 168 lb (76.2 kg)  06/20/22 169 lb 6.4 oz (76.8 kg)   BMI Readings from Last 3 Encounters:  08/04/22 28.46 kg/m  07/03/22 27.96 kg/m  06/25/22 27.96 kg/m    Assessment/Interventions: Review of patient past medical history, allergies, medications, health status, including review of consultants reports, laboratory and other test data, was performed as part of comprehensive evaluation and provision of chronic care management services.   SDOH:  (Social Determinants of Health) assessments and interventions performed: Yes  SDOH Interventions    Flowsheet Row Chronic Care Management from 08/05/2022 in [place] [facility] [place] Management from 06/11/2021 in [place] [facility] [place] from 04/20/2020 in [place] [facility] [place]  SDOH Interventions     Transportation Interventions Intervention Not Indicated Intervention Not Indicated --  Depression Interventions/Treatment  -- -- Medication  Financial Strain Interventions -- Intervention Not Indicated --       SDOH Screenings   Food Insecurity: No Food Insecurity (09/12/2021)  Housing: Low Risk  (09/12/2021)  Transportation Needs: No Transportation Needs  (08/05/2022)  Depression (PHQ2-9): Low Risk  (04/07/2022)  Financial Resource Strain: Low Risk  (09/12/2021)  Physical Activity: Insufficiently Active (09/12/2021)  Social Connections: Socially Integrated (09/12/2021)  Stress: No Stress Concern Present (09/12/2021)  Tobacco Use: Low Risk  (08/04/2022)    CCM Care Plan  Allergies  Allergen Reactions   Cephalosporins Anaphylaxis   Penicillins Anaphylaxis and Hives    Did it involve swelling of the face/tongue/throat, SOB, or low BP? Yes Did it involve sudden or severe rash/hives, skin peeling, or any reaction on the inside of your mouth or nose? Yes Did you need to seek medical attention at a hospital or doctor's office? Yes When did it last happen?      10+ years If all above answers are "NO", may proceed with cephalosporin use.     Anesthetics, Amide Nausea And Vomiting    Does not know name of it. States they put it on record foot center.    Betadine [Povidone Iodine] Other (See Comments)    "Skin Burn" caused scar on Left buttock   Latex Hives, Itching and Rash   Peach [Prunus Persica] Hives   Claritin [Loratadine]     Rash, pruritis     Medications Reviewed Today     Reviewed by Alric Ran, MD (Physician) on 08/04/22 at [place] List Status: <None>   Medication Order Taking? Sig Documenting Provider Last Dose Status Informant  acetaminophen (TYLENOL) 500 MG tablet [id number] No Take 1,000 mg by mouth every 6 (six) hours as needed for headache. [provider] Taking Active Self  ALPRAZolam Duanne Moron) 0.5 MG tablet [id number] No Take 1/2 to 1 tablet (0.25-0.5 mg total) by mouth at bedtime as needed for anxiety. [name], [name], MD Taking Active   amLODipine (NORVASC) 10 MG tablet [id number] No Take 1 tablet by mouth daily. [name], NP Taking Active   atorvastatin (LIPITOR) 20 MG tablet [id number] No TAKE 1 TABLET BY MOUTH ONCE DAILY [name],  Malka So, MD Taking Active   baclofen (LIORESAL) 10 MG tablet  [id number] No TAKE 1 TABLET BY MOUTH THREE TIMES DAILY AS NEEDED [name], [name], MD Taking Active   betamethasone dipropionate (DIPROLENE) 0.05 % ointment [id number] No Apply as directed to affected area twice a day. Taper use as able as directed.  Taking Active   betamethasone dipropionate 0.05 % lotion [id number] No Apply a small amount to affected area once a day  Taking Active Self  busPIRone (BUSPAR) 5 MG tablet [id number] No Take 1 tablet by mouth 2  times daily.  Patient taking differently: Take 5 mg by mouth 2 (two) times daily as needed (anxiety).   [name], [name], MD Taking Active Self           Med Note Kipp Brood, Sherri Mcarthy G   Fri Mar 14, 2022 12:25 PM)    carvedilol (COREG) 25 MG tablet [id number] No Take 2 tablets (50 mg total) by mouth 2 (two) times daily with a meal. Pixie Casino, MD Taking Expired 07/10/22 2359 Self           Med Note Kipp Brood, Soniya Ashraf G   Fri Mar 14, 2022 12:12 PM)    cholecalciferol (VITAMIN D3) 25 MCG (1000 UNIT) tablet [id number] No Take 1,000 Units by mouth daily. [provider] Taking Active Self  diphenhydrAMINE (BENADRYL) 25 MG tablet [id number] No Take 25 mg by mouth daily as needed for itching. [provider] Taking Active Self  Estradiol Acetate (FEMRING) 0.05 MG/24HR RING [id number] No USE ONE RING VAGINALLY EVERY 3 MONTHS AS DIRECTED  Taking Active   gabapentin (NEURONTIN) 400 MG capsule [id number] No Take 1 capsule by mouth three times daily. Suella Broad, MD Taking Active   hydrALAZINE (APRESOLINE) 25 MG tablet [id number] No Please take every 8 hours as needed for Systolic Blood pressure greater than 180 Walker, Martie Lee, NP Taking Active   meclizine (ANTIVERT) 25 MG tablet [id number] No Take 25 mg by mouth 3 (three) times daily as needed for dizziness. [provider] Taking Active Self  methocarbamol (ROBAXIN) 500 MG tablet [id number] No Take 1 tablet by mouth 3 times every day as needed for muscle spasms  Taking Active    neomycin-bacitracin-polymyxin (NEOSPORIN) ointment [id number] No Apply 1 application topically as needed for wound care. [provider] Taking Active Self  oxyCODONE-acetaminophen (PERCOCET) 7.5-325 MG tablet [id number] No Take 1 tablet by mouth daily as needed for severe pain. [provider] Taking Active Self           Med Note Casilda Carls   Wed Jun 25, 2022  9:38 AM) NOT FROM [facility]  [facility] 06/25/22  sodium chloride 1 g tablet [id number] No Take 1 tablet by mouth 3 times per day for 3 days. [name], NP Taking Active   spironolactone (ALDACTONE) 25 MG tablet [id number] No Take 1 tablet (25 mg total) by mouth daily. [name], NP Taking Active   traMADol (ULTRAM) 50 MG tablet [id number] No Take 1 tablet by mouth every 6 (six) hours as needed. Meyran, Ocie Cornfield, NP Taking Active            Med Note Frederich Chick Jun 25, 2022  9:38 AM) NOT FROM [facility]  [facility] 06/25/22            Patient Active Problem List   Diagnosis Date Noted   Cervical spondylosis with radiculopathy 01/05/2022   S/P cervical spinal fusion 01/03/2022   Cervical  disc disorder with radiculopathy of cervical region 11/27/2021   Uncontrolled pain 05/27/2021   Syncope 05/13/2021   Depression with anxiety 05/13/2021   Chronic diastolic CHF (congestive heart failure) ([place]) 05/13/2021   Chronic back pain 05/13/2021   Chest tightness 05/13/2021   Lower abdominal pain 05/13/2021   Syncope and collapse 01/01/2021   Abnormal echocardiogram 01/01/2021   BPPV (benign paroxysmal positional vertigo), left 12/26/2020   Odynophagia    Dysphagia 12/15/2020   Postural dizziness with presyncope 12/14/2020   Lumbar post-laminectomy syndrome    Chronic pain 09/28/2019   Lumbar spinal stenosis    Radicular pain    Gait disorder    Difficulty in urination 07/13/2018   Back pain 07/12/2018   Hyperlipidemia 04/16/2018   GAD (generalized anxiety disorder) 04/16/2018   AKI (acute  kidney injury) ([place])    Benign essential HTN    Major depression, recurrent ([place])    Lumbar radiculopathy 03/09/2018   Paraparesis ([place]) 03/09/2018   Essential hypertension    Chronic nonintractable headache    Leukocytosis    Acute blood loss anemia    Postoperative pain    Generalized weakness    Spinal stenosis at L4-L5 level 03/04/2018   Spinal stenosis of lumbar region 12/28/2015   Chest pain 04/16/2015    Immunization History  Administered Date(s) Administered   Influenza,inj,Quad PF,6+ Mos 08/03/2018, 08/09/2019, 08/20/2020, 07/04/2021, 07/21/2022   Influenza,inj,quad, With Preservative 07/27/2017   Influenza-Unspecified 08/07/2021   PFIZER(Purple Top)SARS-COV-2 Vaccination 12/14/2019, 01/05/2020, 07/24/2020, 02/15/2021   Pfizer Covid-19 Vaccine Bivalent Booster 4yrs & up 07/30/2022   Td 10/28/2007   Tdap 10/27/2012, 06/09/2014   Unspecified SARS-COV-2 Vaccination 08/28/2021   Zoster Recombinat (Shingrix) 08/27/2021, 03/12/2022   Patient reports she thinks her BP is doing a lot better lately and patient is only taking the hydralazine as needed now. Patient reports her ankles are still swelling without taking much of the hydralazine. Patient reports she has had some swelling which just started right before the nurse came. Patient is keeping her legs elevated as much as possible. Patient doesn't want to use the compression stockings. She wasn't sure if the swelling was coming from one of her medications.  Patient has had a lot of stress lately. Her brother doesn't have much longer to live and her sister passed away a few months ago.  Conditions to be addressed/monitored:  Hypertension, Hyperlipidemia, Depression, Anxiety, and Pain  Conditions addressed this visit: Anxiety, hypertension, hyperlipidemia  Care Plan : CCM Pharmacy Care Plan  Updates made by Viona Gilmore, [place] since 08/05/2022 12:00 AM     Problem: Problem: Hypertension, Hyperlipidemia, Depression,  Anxiety, and Pain      Long-Range Goal: Patient-Specific Goal   Start Date: 06/11/2021  Expected End Date: 06/11/2022  Recent Progress: On track  Priority: High  Note:   Current Barriers:  Unable to independently monitor therapeutic efficacy Unable to achieve control of cholesterol and blood pressure   Pharmacist Clinical Goal(s):  Patient will achieve adherence to monitoring guidelines and medication adherence to achieve therapeutic efficacy achieve control of cholesterol as evidenced by next lipid panel  through collaboration with PharmD and provider.   Interventions: 1:1 collaboration with [name], [name], MD regarding development and update of comprehensive plan of care as evidenced by provider attestation and co-signature Inter-disciplinary care team collaboration (see longitudinal plan of care) Comprehensive medication review performed; medication list updated in electronic medical record  Hypertension (BP goal <130/80) -Uncontrolled -Current treatment: Carvedilol 25 mg 2 tablets twice daily -  Appropriate, Query effective, Query Safe, Accessible Amlodipine 10 mg tablet daily - Appropriate, Query effective, Safe, Accessible Hydralazine 25 mg 1 tablet three times daily as needed - Appropriate, Effective, Safe, Accessible -Medications previously tried: telmisartan-HCTZ, telmisartan, spironolactone -Current home readings: 130-140/80-90s (wrist cuff) -Current dietary habits: cut back on salt; uses more pepper and cinnamon; looks at package labels -Current exercise habits: not able to right now -Denies hypotensive/hypertensive symptoms -Educated on BP goals and benefits of medications for prevention of heart attack, stroke and kidney damage; Exercise goal of 150 minutes per week; Importance of home blood pressure monitoring; Proper BP monitoring technique; -Counseled to monitor BP at home 2-3 times a week, document, and provide log at future appointments -Counseled on diet and  exercise extensively Recommended to continue current medication Recommended decreasing carvedilol dose given beyond maximum recommended dose.  Hyperlipidemia: (LDL goal < 100) -Uncontrolled -Current treatment: Atorvastatin 20 mg 1 tablet daily - Appropriate, Query effective, Safe, Accessible -Medications previously tried: none  -Current dietary patterns: used to eat out a lot; eating more salads, baked chicken and [place] but does have some fried fish or chicken; eating a mot of fruit and some vegetables -Current exercise habits: not able to -Educated on Cholesterol goals;  Benefits of statin for ASCVD risk reduction; Importance of limiting foods high in cholesterol; Exercise goal of 150 minutes per week; -Counseled on diet and exercise extensively Recommended repeat lipid panel and increasing statin if LDL still above goal.  Depression/Anxiety (Goal: minimize symptoms) -Not ideally controlled -Current treatment: Alprazolam 0.5 mg 1/2-1 tablet daily as needed - Appropriate, Effective, Safe, Accessible Buspirone 5 mg 1 tablet twice daily - Appropriate, Query effective, Safe, Accessible - not taking -Medications previously tried/failed: clonazepam, duloxetine -PHQ9: 10 -GAD7: 12 -Educated on Benefits of medication for symptom control Benefits of cognitive-behavioral therapy with or without medication -Counseled on differences between duloxetine and alprazolam for anxiety. Recommended restarting taking buspirone every day as it does not work for as needed use.  Pain (Goal: minimize pain) -Not ideally controlled -Current treatment  Tamadol 50 mg as needed - Appropriate, Effective, Query Safe, Accessible Gabapentin 400 mg 1 capsule 3 times daily - Appropriate, Effective, Safe, Accessible Baclofen 10 mg 1 tablet three times daily as needed - Appropriate, Effective, Safe, Accessible Oxycodone-acetaminophen 7.5-325 mg 1 tablet daily as needed - Appropriate, Effective, Query Safe,  Accessible -Medications previously tried: n/a  -Counseled on risk for sedation and separating these medications.  Health Maintenance -Vaccine gaps: Prevnar -Current therapy:  Femring every 3 months (plan to taper off the Femring)  Meclizine 25 mg as needed (not taken in a while - Vertigo) Benadryl 25 mg as needed Senokot-S 6.8-50 mg as needed -Educated on Cost vs benefit of each product must be carefully weighed by individual consumer -Patient is satisfied with current therapy and denies issues -Counseled on long term risks of estrogen therapy and patient reports the plan is to stop it soon.  Patient Goals/Self-Care Activities Patient will:  - take medications as prescribed check blood pressure at least weekly, document, and provide at future appointments target a minimum of 150 minutes of moderate intensity exercise weekly  Follow Up Plan: The care management team will reach out to the patient again over the next 60 days.         Medication Assistance: None required.  Patient affirms current coverage meets needs.  Compliance/Adherence/Medication fill history: Care Gaps: BP- 130/81 (08/04/22)  Star-Rating Drugs: Telmisartan 40 mg - Last filled 05/12/22 90 DS at [facility]  Atorvastatin (Lipitor) 20 mg - Last filled 07/14/22 90 DS at [place]  Patient's preferred pharmacy is:  [facility] [name] [address] Phone: [phone] Fax: [phone]   Uses pill box? Yes - has an AM/PM medicine Pt endorses 99% compliance  We discussed: Current pharmacy is preferred with insurance plan and patient is satisfied with pharmacy services Patient decided to: Continue current medication management strategy  Care Plan and Follow Up Patient Decision:  Patient agrees to Care Plan and Follow-up.  Plan: The care management team will reach out to the patient again over the next 30 days.  Jeni Salles, PharmD, [place]  Pharmacist [place] [facility] [place]

## 2022-08-04 NOTE — Progress Notes (Signed)
[facility]  PATIENT: [name] DOB: Jun 15, 1961  REQUESTING CLINICIAN: Eulas Post, MD HISTORY FROM: Patient  REASON FOR VISIT: Syncopal episode    HISTORICAL  CHIEF COMPLAINT:  Chief Complaint  Patient presents with   New Patient (Initial Visit)    Rm 12, alone NP/Internal referral for seizure vs pseudoseizure vs vasovagal    HISTORY OF PRESENT ILLNESS:  This is a 61 year old woman past medical history hypertension, hyperlipidemia, history of chronic back pain status post surgery, right foot drop who is presenting after a few syncopal episodes.  Patient reports that her first syncopal episode started last year, she woke up not feeling well, experience chest pain.  She presented to her primary care doctor and had a syncopal episode there.  She was admitted to the hospital found to have new LVH; seen by cardiology.  She has been struggling with her blood pressure but stated that lately with the combination of amlodipine, carvedilol, hydralazine and spironolactone her blood pressure is better controlled. On August 21 she again was sent to the ED due to an another syncopal episode.  Patient reports she woke up in the morning, not feeling well, the whole night she experienced severe diarrhea.  She went ahead to her cardiology appointment, she was scheduled to have echocardiogram and there, she started feeling lightheaded, not feeling well and when she was on the table, had another syncopal episode.  She reported hearing people's voice but she could not answer.  She was taken to the ED but after receiving IV fluids she was back to her normal self and left AMA. 2 weeks ago, she woke up to use the bathroom but fell in her bedroom. Husband heard the noise and went to assist her. She was able to answer questions appropriately. She feels she might had fallen due to her right foot drop and using a cane.   Handedness: Right handed   Onset: Last year    Seizure Type: Passing out, likely syncopal episode  Current frequency: A total of 3 total   Any injuries from seizures: Back pain   Seizure risk factors: Mother with seizure   Previous ASMs: None   Currenty ASMs: None   ASMs side effects: N/A   Brain Images: Normal Head CT 04/2021  Previous EEGs: None    OTHER MEDICAL CONDITIONS: Hypertension, history of back injury, right foot drop  REVIEW OF SYSTEMS: Full 14 system review of systems performed and negative with exception of: As noted in the HPI   ALLERGIES: Allergies  Allergen Reactions   Cephalosporins Anaphylaxis   Penicillins Anaphylaxis and Hives    Did it involve swelling of the face/tongue/throat, SOB, or low BP? Yes Did it involve sudden or severe rash/hives, skin peeling, or any reaction on the inside of your mouth or nose? Yes Did you need to seek medical attention at a hospital or doctor's office? Yes When did it last happen?      10+ years If all above answers are "NO", may proceed with cephalosporin use.     Anesthetics, Amide Nausea And Vomiting    Does not know name of it. States they put it on record foot center.    Betadine [Povidone Iodine] Other (See Comments)    "Skin Burn" caused scar on Left buttock   Latex Hives, Itching and Rash   Peach [Prunus Persica] Hives   Claritin [Loratadine]     Rash, pruritis     HOME MEDICATIONS: Outpatient Medications Prior to Visit  Medication Sig Dispense Refill   acetaminophen (TYLENOL) 500 MG tablet Take 1,000 mg by mouth every 6 (six) hours as needed for headache.     ALPRAZolam (XANAX) 0.5 MG tablet Take 1/2 to 1 tablet (0.25-0.5 mg total) by mouth at bedtime as needed for anxiety. 30 tablet 2   amLODipine (NORVASC) 10 MG tablet Take 1 tablet by mouth daily. 90 tablet 3   atorvastatin (LIPITOR) 20 MG tablet TAKE 1 TABLET BY MOUTH ONCE DAILY 90 tablet 2   baclofen (LIORESAL) 10 MG tablet TAKE 1 TABLET BY MOUTH THREE TIMES DAILY AS NEEDED 90 tablet 3    betamethasone dipropionate (DIPROLENE) 0.05 % ointment Apply as directed to affected area twice a day. Taper use as able as directed. 45 g 3   betamethasone dipropionate 0.05 % lotion Apply a small amount to affected area once a day 60 mL 11   busPIRone (BUSPAR) 5 MG tablet Take 1 tablet by mouth 2  times daily. (Patient taking differently: Take 5 mg by mouth 2 (two) times daily as needed (anxiety).) 180 tablet 1   carvedilol (COREG) 25 MG tablet Take 2 tablets (50 mg total) by mouth 2 (two) times daily with a meal. 360 tablet 3   cholecalciferol (VITAMIN D3) 25 MCG (1000 UNIT) tablet Take 1,000 Units by mouth daily.     diphenhydrAMINE (BENADRYL) 25 MG tablet Take 25 mg by mouth daily as needed for itching.     Estradiol Acetate (FEMRING) 0.05 MG/24HR RING USE ONE RING VAGINALLY EVERY 3 MONTHS AS DIRECTED 1 each 4   gabapentin (NEURONTIN) 400 MG capsule Take 1 capsule by mouth three times daily. 90 capsule 3   hydrALAZINE (APRESOLINE) 25 MG tablet Please take every 8 hours as needed for Systolic Blood pressure greater than 180 30 tablet 6   meclizine (ANTIVERT) 25 MG tablet Take 25 mg by mouth 3 (three) times daily as needed for dizziness.     methocarbamol (ROBAXIN) 500 MG tablet Take 1 tablet by mouth 3 times every day as needed for muscle spasms 30 tablet 1   neomycin-bacitracin-polymyxin (NEOSPORIN) ointment Apply 1 application topically as needed for wound care.     oxyCODONE-acetaminophen (PERCOCET) 7.5-325 MG tablet Take 1 tablet by mouth daily as needed for severe pain.     sodium chloride 1 g tablet Take 1 tablet by mouth 3 times per day for 3 days. 9 tablet 0   spironolactone (ALDACTONE) 25 MG tablet Take 1 tablet (25 mg total) by mouth daily. 90 tablet 3   traMADol (ULTRAM) 50 MG tablet Take 1 tablet by mouth every 6 (six) hours as needed. 30 tablet 2   No facility-administered medications prior to visit.    PAST MEDICAL HISTORY: Past Medical History:  Diagnosis Date   Anxiety     Aortic atherosclerosis ([place]) 04/16/2021   Atypical angina    Back pain    related to spinal stenosis and disc problem, radiates down left buttocks to leg., weakness occ.   Chest pain    a. 03/2015 Cath: nl cors; b. 03/2021 Cor CTA: Ca2+ = 0. Nl Cors.   Chronic pain syndrome    Dyspnea    GERD (gastroesophageal reflux disease)    Grade I diastolic dysfunction    Headache    Hyperlipidemia    Hypertension    Lumbar post-laminectomy syndrome    LVH (left ventricular hypertrophy) 12/15/2020   a. 11/2020 Echo: EF 65-70%, no rwma, sev asymm LVH with IVSd 1.9 cm. No LVOT  obs @ rest. Gr1 DD. Triv MR.   PONV (postoperative nausea and vomiting)    Pulmonary nodules    a. 03/2021 CT Chest: 58mm pulm nodules in bilat lower lobes. F/u 1 yr.   Right foot drop    Syncope    a. 11/2020 Zio: No significant arrhythmias.   Vaginal foreign object    "Uses Femring"    PAST SURGICAL HISTORY: Past Surgical History:  Procedure Laterality Date   ABDOMINAL HYSTERECTOMY     ANTERIOR CERVICAL DECOMP/DISCECTOMY FUSION N/A 01/03/2022   Procedure: Anterior Cervical Decompression Fusion - Cervical four-Cervical five - Cervical five-Cervical six;  Surgeon: Eustace Moore, MD;  Location: [place];  Service: Neurosurgery;  Laterality: N/A;   BIOPSY  12/16/2020   Procedure: BIOPSY;  Surgeon: Ladene Artist, MD;  Location: [place];  Service: Endoscopy;;   CARDIAC CATHETERIZATION N/A 04/18/2015   Procedure: Left Heart Cath and Coronary Angiography;  Surgeon: [name], MD;  Location: [place] [facility];  Service: Cardiovascular;  Laterality: N/A;   COLONOSCOPY W/ BIOPSIES AND POLYPECTOMY     ESOPHAGOGASTRODUODENOSCOPY (EGD) WITH PROPOFOL N/A 12/16/2020   Procedure: ESOPHAGOGASTRODUODENOSCOPY (EGD) WITH PROPOFOL;  Surgeon: Ladene Artist, MD;  Location[facility];  Service: Endoscopy;  Laterality: N/A;   FOOT SURGERY Bilateral    [place] "bunion,bone spur, tendon" (1) -6'16, (1)-10'16   HEMATOMA  EVACUATION N/A 01/05/2022   Procedure: Cervical Wound Exploration;  Surgeon: Ashok Pall, MD;  Location: [place];  Service: Neurosurgery;  Laterality: N/A;   IR EPIDUROGRAPHY  07/21/2018   LUMBAR LAMINECTOMY/DECOMPRESSION MICRODISCECTOMY Bilateral 12/28/2015   Procedure: MICRO LUMBAR DECOMPRESSION L4 - L5 BILATERALLY;  Surgeon: [name], MD;  Location: [facility];  Service: Orthopedics;  Laterality: Bilateral;   LUMBAR LAMINECTOMY/DECOMPRESSION MICRODISCECTOMY Bilateral 03/04/2018   Procedure: Revision of Microlumbar Decompression Bilateral Lumbar Four-Five;  Surgeon: [name], MD;  Location: [place];  Service: Orthopedics;  Laterality: Bilateral;  90 mins   SAVORY DILATION N/A 12/16/2020   Procedure: SAVORY DILATION;  Surgeon: Ladene Artist, MD;  Location: [facility]OSCOPY;  Service: Endoscopy;  Laterality: N/A;   SPINAL CORD STIMULATOR INSERTION N/A 09/28/2019   Procedure: THORACIC SPINAL CORD STIMULATOR INSERTION;  Surgeon: Melina Schools, MD;  Location: [place];  Service: Orthopedics;  Laterality: N/A;  2.5 hrs   SPINAL CORD STIMULATOR REMOVAL N/A 05/27/2021   Procedure: LUMBAR SPINAL CORD STIMULATOR REMOVAL;  Surgeon: Deetta Perla, MD;  Location: [facility];  Service: Neurosurgery;  Laterality: N/A;   TUBAL LIGATION     WISDOM TOOTH EXTRACTION     WOUND EXPLORATION N/A 03/04/2018   Procedure: EXPLORATION OF LUMBAR DECOMPRESSION WOUND;  Surgeon: [name], MD;  Location: [facility];  Service: Orthopedics;  Laterality: N/A;    FAMILY HISTORY: Family History  Problem Relation Age of Onset   Heart attack Mother    Lung cancer Father    Cancer Father    Pancreatic cancer Sister    Breast cancer Sister 8   Throat cancer Brother    Multiple myeloma Sister    Breast cancer Sister        diagnosed in her 69's   Heart attack Sister    Stomach cancer Cousin    Colon cancer Neg Hx     SOCIAL HISTORY: Social History   Socioeconomic History   Marital status: Married    Spouse name: Nelida Gores    Number of children: 1   Years of education: Not on file   Highest education level:  Not on file  Occupational History   Not on file  Tobacco Use   Smoking status: Never   Smokeless tobacco: Never  Vaping Use   Vaping Use: Never used  Substance and Sexual Activity   Alcohol use: No   Drug use: No   Sexual activity: Yes    Partners: Male    Birth control/protection: Post-menopausal  Other Topics Concern   Not on file  Social History Narrative   Not on file   Social Determinants of Health   Financial Resource Strain: Low Risk  (09/12/2021)   Overall Financial Resource Strain (CARDIA)    Difficulty of Paying Living Expenses: Not hard at all  Food Insecurity: No Food Insecurity (09/12/2021)   Hunger Vital Sign    Worried About Running Out of Food in the Last Year: Never true    Ran Out of Food in the Last Year: Never true  Transportation Needs: No Transportation Needs (09/12/2021)   PRAPARE - Hydrologist (Medical): No    Lack of Transportation (Non-Medical): No  Physical Activity: Insufficiently Active (09/12/2021)   Exercise Vital Sign    Days of Exercise per Week: 2 days    Minutes of Exercise per Session: 40 min  Stress: No Stress Concern Present (09/12/2021)   [place]    Feeling of Stress : Not at all  Social Connections: [place] (09/12/2021)   Social Connection and Isolation Panel [NHANES]    Frequency of Communication with Friends and Family: More than three times a week    Frequency of Social Gatherings with Friends and Family: Twice a week    Attends Religious Services: 1 to 4 times per year    Active Member of [employer] or Organizations: Yes    Attends Archivist Meetings: 1 to 4 times per year    Marital Status: Married  Human resources officer Violence: Not At Risk (09/12/2021)   Humiliation, Afraid, Rape, and Kick questionnaire    Fear of Current or  Ex-Partner: No    Emotionally Abused: No    Physically Abused: No    Sexually Abused: No    PHYSICAL EXAM  GENERAL EXAM/CONSTITUTIONAL: Vitals:  Vitals:   08/04/22 1042  BP: 130/81  Pulse: 65  Weight: 171 lb (77.6 kg)  Height: $Remove'5\' 5"'UMeMoWN$  (1.651 m)   Body mass index is 28.46 kg/m. Wt Readings from Last 3 Encounters:  08/04/22 171 lb (77.6 kg)  06/25/22 168 lb (76.2 kg)  06/20/22 169 lb 6.4 oz (76.8 kg)   Patient is in no distress; well developed, nourished and groomed; neck is supple. Emotional during interview   EYES: Pupils round and reactive to light, Visual fields full to confrontation, Extraocular movements intacts,  No results found.  MUSCULOSKELETAL: Gait, strength, tone, movements noted in Neurologic exam below  NEUROLOGIC: MENTAL STATUS:      No data to display         awake, alert, oriented to person, place and time recent and remote memory intact normal attention and concentration language fluent, comprehension intact, naming intact fund of knowledge appropriate  CRANIAL NERVE:  2nd, 3rd, 4th, 6th - pupils equal and reactive to light, visual fields full to confrontation, extraocular muscles intact, no nystagmus 5th - facial sensation symmetric 7th - facial strength symmetric 8th - hearing intact 9th - palate elevates symmetrically, uvula midline 11th - shoulder shrug symmetric 12th - tongue protrusion midline  MOTOR:  normal bulk and  tone, full strength in the BUE, BLE except for weakness in the right foot due to right foot drop. She is using a right AFO  SENSORY:  normal and symmetric to light touch  COORDINATION:  finger-nose-finger, fine finger movements normal  REFLEXES:  deep tendon reflexes present and symmetric  GAIT/STATION:  Uses a cane      DIAGNOSTIC DATA (LABS, IMAGING, TESTING) - I reviewed patient records, labs, notes, testing and imaging myself where available.  Lab Results  Component Value Date   WBC 5.1 12/31/2021    HGB 13.1 12/31/2021   HCT 39.6 12/31/2021   MCV 90.6 12/31/2021   PLT 256 12/31/2021      Component Value Date/Time   NA 142 06/25/2022 1103   K 5.0 06/25/2022 1103   CL 104 06/25/2022 1103   CO2 29 06/25/2022 1103   GLUCOSE 86 06/25/2022 1103   GLUCOSE 92 04/07/2022 0906   BUN 9 06/25/2022 1103   CREATININE 1.08 (H) 06/25/2022 1103   CREATININE 0.99 04/01/2019 1535   CALCIUM 9.6 06/25/2022 1103   PROT 7.2 04/07/2022 0906   ALBUMIN 4.1 04/07/2022 0906   AST 18 04/07/2022 0906   ALT 14 04/07/2022 0906   ALKPHOS 70 04/07/2022 0906   BILITOT 0.4 04/07/2022 0906   GFRNONAA 58 (L) 12/31/2021 0931   GFRAA >60 09/23/2019 1207   Lab Results  Component Value Date   CHOL 187 04/07/2022   HDL 61.50 04/07/2022   LDLCALC 116 (H) 04/07/2022   TRIG 48.0 04/07/2022   Lab Results  Component Value Date   HGBA1C 5.5 03/06/2018   Lab Results  Component Value Date   MCNOBSJG28 366 04/01/2019   Lab Results  Component Value Date   TSH 2.610 06/06/2022    Head CT 05/13/22 No acute intracranial abnormality noted.     ASSESSMENT AND PLAN  61 y.o. year old female  with history of hypertension, hyperlipidemia, chronic back pain, right foot drop who is presenting with few syncopal episodes.  Based on description these are less likely epileptic seizures, but will still obtain a routine EEG to rule it out. If normal, patient can continue to follow with PCP, and increase her hydration.   In terms of the right foot drop we also discussed about resuming PT.  She voices understanding.  Continue to follow PCP and return as needed    1. Syncope, unspecified syncope type     Patient Instructions  Routine EEG  Will contact you to go over the results  Continue to follow up with PCP  Resume PT for the right foot drop  Return as needed    Per [facility] statutes, patients with seizures are not allowed to drive until they have been seizure-free for six months.  Other recommendations  include using caution when using heavy equipment or power tools. Avoid working on ladders or at heights. Take showers instead of baths.  Do not swim alone.  Ensure the water temperature is not too high on the home water heater. Do not go swimming alone. Do not lock yourself in a room alone (i.e. bathroom). When caring for infants or small children, sit down when holding, feeding, or changing them to minimize risk of injury to the child in the event you have a seizure. Maintain good sleep hygiene. Avoid alcohol.  Also recommend adequate sleep, hydration, good diet and minimize stress.   During the Seizure  - First, ensure adequate ventilation and place patients on the floor on their left  side  Loosen clothing around the neck and ensure the airway is patent. If the patient is clenching the teeth, do not force the mouth open with any object as this can cause severe damage - Remove all items from the surrounding that can be hazardous. The patient may be oblivious to what's happening and may not even know what he or she is doing. If the patient is confused and wandering, either gently guide him/her away and block access to outside areas - Reassure the individual and be comforting - Call 911. In most cases, the seizure ends before EMS arrives. However, there are cases when seizures may last over 3 to 5 minutes. Or the individual may have developed breathing difficulties or severe injuries. If a pregnant patient or a person with diabetes develops a seizure, it is prudent to call an ambulance. - Finally, if the patient does not regain full consciousness, then call EMS. Most patients will remain confused for about 45 to 90 minutes after a seizure, so you must use judgment in calling for help. - Avoid restraints but make sure the patient is in a bed with padded side rails - Place the individual in a lateral position with the neck slightly flexed; this will help the saliva drain from the mouth and prevent the  tongue from falling backward - Remove all nearby furniture and other hazards from the area - Provide verbal assurance as the individual is regaining consciousness - Provide the patient with privacy if possible - Call for help and start treatment as ordered by the caregiver   After the Seizure (Postictal Stage)  After a seizure, most patients experience confusion, fatigue, muscle pain and/or a headache. Thus, one should permit the individual to sleep. For the next few days, reassurance is essential. Being calm and helping reorient the person is also of importance.  Most seizures are painless and end spontaneously. Seizures are not harmful to others but can lead to complications such as stress on the lungs, brain and the heart. Individuals with prior lung problems may develop labored breathing and respiratory distress.     Orders Placed This Encounter  Procedures   EEG adult    No orders of the defined types were placed in this encounter.   Return if symptoms worsen or fail to improve.  I have spent a total of 45 minutes dedicated to this patient today, preparing to see patient, performing a medically appropriate examination and evaluation, ordering tests and/or medications and procedures, and counseling and educating the patient/family/caregiver; independently interpreting result and communicating results to the family/patient/caregiver; and documenting clinical information in the electronic medical record.   Alric Ran, MD 08/04/2022, 11:46 AM  [facility] [address] [place],  [address] [phone]

## 2022-08-04 NOTE — Procedures (Signed)
    History:  61 year old woman with syncope  EEG classification:  Awake and asleep  Description of the recording: The background rhythms of this recording consists of a fairly well modulated medium amplitude background beta. As the record progresses, the patient initially is in the waking state, but appears to enter the early stage II sleep during the recording, with rudimentary sleep spindles and vertex sharp wave activity seen. During the wakeful state, photic stimulation is performed, and no abnormal responses were seen. Hyperventilation was also performed, no abnormal response seen. No epileptiform discharges seen during this recording. There was no focal slowing. EKG monitor shows no evidence of cardiac rhythm abnormalities with a heart rate of 60.  Abnormality: None   Impression: This is essentially a normal EEG recording in the waking and sleeping state. The beta activity is likely from medication side effect.  No evidence interictal epileptiform discharges were seen at any time during the recording.  A normal EEG does not exclude a diagnosis of epilepsy.    Alric Ran, MD [facility]

## 2022-08-05 ENCOUNTER — Ambulatory Visit (INDEPENDENT_AMBULATORY_CARE_PROVIDER_SITE_OTHER): Payer: PPO | Admitting: Pharmacist

## 2022-08-05 ENCOUNTER — Telehealth: Payer: PPO

## 2022-08-05 DIAGNOSIS — I10 Essential (primary) hypertension: Secondary | ICD-10-CM

## 2022-08-05 DIAGNOSIS — E785 Hyperlipidemia, unspecified: Secondary | ICD-10-CM

## 2022-08-05 DIAGNOSIS — F411 Generalized anxiety disorder: Secondary | ICD-10-CM

## 2022-08-05 NOTE — Patient Instructions (Signed)
Hi Devri,  I am glad you are doing better and so is your blood pressure! Please restart taking the buspirone consistently twice a day like we discussed as this is where you will get a benefit from it. It is not designed to be used as needed.  Please reach out to me if you have any questions or need anything before our follow up!  Best, Maddie  Jeni Salles, PharmD, [place] Pharmacist [facility]   Visit Information   Goals Addressed   None    Patient Care Plan: CCM Pharmacy Care Plan     Problem Identified: Problem: Hypertension, Hyperlipidemia, Depression, Anxiety, and Pain      Long-Range Goal: Patient-Specific Goal   Start Date: 06/11/2021  Expected End Date: 06/11/2022  Recent Progress: On track  Priority: High  Note:   Current Barriers:  Unable to independently monitor therapeutic efficacy Unable to achieve control of cholesterol and blood pressure   Pharmacist Clinical Goal(s):  Patient will achieve adherence to monitoring guidelines and medication adherence to achieve therapeutic efficacy achieve control of cholesterol as evidenced by next lipid panel  through collaboration with PharmD and provider.   Interventions: 1:1 collaboration with [name], [name], MD regarding development and update of comprehensive plan of care as evidenced by provider attestation and co-signature Inter-disciplinary care team collaboration (see longitudinal plan of care) Comprehensive medication review performed; medication list updated in electronic medical record  Hypertension (BP goal <130/80) -Uncontrolled -Current treatment: Carvedilol 25 mg 2 tablets twice daily - Appropriate, Query effective, Query Safe, Accessible Amlodipine 10 mg tablet daily - Appropriate, Query effective, Safe, Accessible Hydralazine 25 mg 1 tablet three times daily as needed - Appropriate, Effective, Safe, Accessible -Medications previously tried:  telmisartan-HCTZ, telmisartan, spironolactone -Current home readings: 130-140/80-90s (wrist cuff) -Current dietary habits: cut back on salt; uses more pepper and cinnamon; looks at package labels -Current exercise habits: not able to right now -Denies hypotensive/hypertensive symptoms -Educated on BP goals and benefits of medications for prevention of heart attack, stroke and kidney damage; Exercise goal of 150 minutes per week; Importance of home blood pressure monitoring; Proper BP monitoring technique; -Counseled to monitor BP at home 2-3 times a week, document, and provide log at future appointments -Counseled on diet and exercise extensively Recommended to continue current medication Recommended decreasing carvedilol dose given beyond maximum recommended dose.  Hyperlipidemia: (LDL goal < 100) -Uncontrolled -Current treatment: Atorvastatin 20 mg 1 tablet daily - Appropriate, Query effective, Safe, Accessible -Medications previously tried: none  -Current dietary patterns: used to eat out a lot; eating more salads, baked chicken and [place] but does have some fried fish or chicken; eating a mot of fruit and some vegetables -Current exercise habits: not able to -Educated on Cholesterol goals;  Benefits of statin for ASCVD risk reduction; Importance of limiting foods high in cholesterol; Exercise goal of 150 minutes per week; -Counseled on diet and exercise extensively Recommended repeat lipid panel and increasing statin if LDL still above goal.  Depression/Anxiety (Goal: minimize symptoms) -Not ideally controlled -Current treatment: Alprazolam 0.5 mg 1/2-1 tablet daily as needed - Appropriate, Effective, Safe, Accessible Buspirone 5 mg 1 tablet twice daily - Appropriate, Query effective, Safe, Accessible - not taking -Medications previously tried/failed: clonazepam, duloxetine -PHQ9: 10 -GAD7: 12 -Educated on Benefits of medication for symptom control Benefits of  cognitive-behavioral therapy with or without medication -Counseled on differences between duloxetine and alprazolam for anxiety. Recommended restarting taking buspirone every day as it does not work for  as needed use.  Pain (Goal: minimize pain) -Not ideally controlled -Current treatment  Tamadol 50 mg as needed - Appropriate, Effective, Query Safe, Accessible Gabapentin 400 mg 1 capsule 3 times daily - Appropriate, Effective, Safe, Accessible Baclofen 10 mg 1 tablet three times daily as needed - Appropriate, Effective, Safe, Accessible Oxycodone-acetaminophen 7.5-325 mg 1 tablet daily as needed - Appropriate, Effective, Query Safe, Accessible -Medications previously tried: n/a  -Counseled on risk for sedation and separating these medications.  Health Maintenance -Vaccine gaps: Prevnar -Current therapy:  Femring every 3 months (plan to taper off the Femring)  Meclizine 25 mg as needed (not taken in a while - Vertigo) Benadryl 25 mg as needed Senokot-S 6.8-50 mg as needed -Educated on Cost vs benefit of each product must be carefully weighed by individual consumer -Patient is satisfied with current therapy and denies issues -Counseled on long term risks of estrogen therapy and patient reports the plan is to stop it soon.  Patient Goals/Self-Care Activities Patient will:  - take medications as prescribed check blood pressure at least weekly, document, and provide at future appointments target a minimum of 150 minutes of moderate intensity exercise weekly  Follow Up Plan: The care management team will reach out to the patient again over the next 60 days.         Patient verbalizes understanding of instructions and care plan provided today and agrees to view in [place]. Active MyChart status and patient understanding of how to access instructions and care plan via MyChart confirmed with patient.    The pharmacy team will reach out to the patient again over the next 30 days.    Viona Gilmore, [facility]

## 2022-08-05 NOTE — Progress Notes (Unsigned)
Office Visit    Patient Name: [name] Date of Encounter: 08/06/2022  PCP:  [name], [name], MD   [place]  Cardiologist:  Pixie Casino, MD  Advanced Practice Provider:  No care team member to display Electrophysiologist:  None      Chief Complaint    [name] is a 61 y.o. female  presents today for hypertension  Past Medical History    Past Medical History:  Diagnosis Date   Anxiety    Aortic atherosclerosis ([place]) 04/16/2021   Atypical angina    Back pain    related to spinal stenosis and disc problem, radiates down left buttocks to leg., weakness occ.   Chest pain    a. 03/2015 Cath: nl cors; b. 03/2021 Cor CTA: Ca2+ = 0. Nl Cors.   Chronic pain syndrome    Dyspnea    GERD (gastroesophageal reflux disease)    Grade I diastolic dysfunction    Headache    Hyperlipidemia    Hypertension    Lumbar post-laminectomy syndrome    LVH (left ventricular hypertrophy) 12/15/2020   a. 11/2020 Echo: EF 65-70%, no rwma, sev asymm LVH with IVSd 1.9 cm. No LVOT obs @ rest. Gr1 DD. Triv MR.   PONV (postoperative nausea and vomiting)    Pulmonary nodules    a. 03/2021 CT Chest: 45m pulm nodules in bilat lower lobes. F/u 1 yr.   Right foot drop    Syncope    a. 11/2020 Zio: No significant arrhythmias.   Vaginal foreign object    "Uses Femring"   Past Surgical History:  Procedure Laterality Date   ABDOMINAL HYSTERECTOMY     ANTERIOR CERVICAL DECOMP/DISCECTOMY FUSION N/A 01/03/2022   Procedure: Anterior Cervical Decompression Fusion - Cervical four-Cervical five - Cervical five-Cervical six;  Surgeon: JEustace Moore MD;  [facility]  Service: Neurosurgery;  Laterality: N/A;   BIOPSY  12/16/2020   Procedure: BIOPSY;  Surgeon: SLadene Artist MD;  [facility]  Service: Endoscopy;;   CARDIAC CATHETERIZATION N/A 04/18/2015   Procedure: Left Heart Cath and Coronary Angiography;  Surgeon: MCharolette Forward MD;   [facility];  Service: Cardiovascular;  Laterality: N/A;   COLONOSCOPY W/ BIOPSIES AND POLYPECTOMY     ESOPHAGOGASTRODUODENOSCOPY (EGD) WITH PROPOFOL N/A 12/16/2020   Procedure: ESOPHAGOGASTRODUODENOSCOPY (EGD) WITH PROPOFOL;  Surgeon: SLadene Artist MD;  [facility];  Service: Endoscopy;  Laterality: N/A;   FOOT SURGERY Bilateral    TS[place]illo"bunion,bone spur, tendon" (1) -6'16, (1)-10'16   HEMATOMA EVACUATION N/A 01/05/2022   Procedure: Cervical Wound Exploration;  Surgeon: CAshok Pall MD;  [facility]  Service: Neurosurgery;  Laterality: N/A;   IR EPIDUROGRAPHY  07/21/2018   LUMBAR LAMINECTOMY/DECOMPRESSION MICRODISCECTOMY Bilateral 12/28/2015   Procedure: MICRO LUMBAR DECOMPRESSION L4 - L5 BILATERALLY;  Surgeon: JSusa Day MD;  [facility];  Service: Orthopedics;  Laterality: Bilateral;   LUMBAR LAMINECTOMY/DECOMPRESSION MICRODISCECTOMY Bilateral 03/04/2018   Procedure: Revision of Microlumbar Decompression Bilateral Lumbar Four-Five;  Surgeon: BSusa Day MD;  [facility]  Service: Orthopedics;  Laterality: Bilateral;  90 mins   SAVORY DILATION N/A 12/16/2020   Procedure: SAVORY DILATION;  Surgeon: SLadene Artist MD;  [facility];  Service: Endoscopy;  Laterality: N/A;   SPINAL CORD STIMULATOR INSERTION N/A 09/28/2019   Procedure: THORACIC SPINAL CORD STIMULATOR INSERTION;  Surgeon: BMelina Schools MD;  [facility]  Service: Orthopedics;  Laterality: N/A;  2.5 hrs   SPINAL CORD STIMULATOR REMOVAL N/A 05/27/2021   Procedure: LUMBAR SPINAL CORD STIMULATOR REMOVAL;  Surgeon: Deetta Perla, MD;  [facility];  Service: Neurosurgery;  Laterality: N/A;   TUBAL LIGATION     WISDOM TOOTH EXTRACTION     WOUND EXPLORATION N/A 03/04/2018   Procedure: EXPLORATION OF LUMBAR DECOMPRESSION WOUND;  Surgeon: [name], MD;  Location: [place];  Service: Orthopedics;  Laterality: N/A;   Allergies  Allergies  Allergen  Reactions   Cephalosporins Anaphylaxis   Penicillins Anaphylaxis and Hives    Did it involve swelling of the face/tongue/throat, SOB, or low BP? Yes Did it involve sudden or severe rash/hives, skin peeling, or any reaction on the inside of your mouth or nose? Yes Did you need to seek medical attention at a hospital or doctor's office? Yes When did it last happen?      10+ years If all above answers are "NO", may proceed with cephalosporin use.     Anesthetics, Amide Nausea And Vomiting    Does not know name of it. States they put it on record foot center.    Betadine [Povidone Iodine] Other (See Comments)    "Skin Burn" caused scar on Left buttock   Latex Hives, Itching and Rash   Peach [Prunus Persica] Hives   Claritin [Loratadine]     Rash, pruritis     History of Present Illness    [name] is a 62 y.o. female with a hx of HTN, HLD, carotid stenosis, lumbar back pain, anxiety last seen 06/20/22.  Prior cardiac cath 2016 with no coronary disease. 03/2021 cardiac CT with coronary calcium score of 0. 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  Clinic visit 05/09/22 for elevated BP and chest pain in setting of losing her sister unexpectedly due to stroke. Telmisartan increased. Chest pain was atypical for angina and thought to be related to grief, stress. She was provided Hydralazine '25mg'$  PRN for SBP >180. At follow up 06/03/22 no real improvement in BP Telmisartan stopped to allow for renin-aldosterone level to be checked. Started on Chlorthalidone '25mg'$  QD. Coreg '50mg'$  BID continued. Due to reports of lightheadedness, falls, and questionable syncope ZIO, carotid duplex, echo ordered. Catecholamines, metanephrines unremarkable.  Aldosterone level normal but renin-aldosterone ratio elevated.   She presented for her carotid duplex and echo 06/16/22 she became unresponsive with seizure like activity. Transported via EMS to ED but left AMA prior to workup. Had follow up at her PCP office and referred to neurology. Subsequent sodium loading with normal 24h urine sodium (creatinine, potassium unfortunately not collected due to alb error).   At visit 06/20/22 Spironolactone held for further evaluation of possible hyperaldosteronism. Chlorthalidone and Carvedilol continued, Hydralazine '50mg'$  TID initiated.   Monitor 06/16/2022 predominantly normal sinus rhythm, first-degree AV block, 1 run SVT 4 beats max rate 122 bpm.  Isolated nonconducted beat noticed (Mobitz 2).  Echo 07/03/2022 normal LVEF, moderate LVH, no significant valve abnormalities. Carotid duplex 07/03/22 left ICA 1 to 39% stenosis.  Saw neurology 08/04/22 with plan for EEG.  Presents today for follow up. Has been taking her Hydralazine '25mg'$  daily as she worried it was causing lower extremity edema. She did not start Chlorthalidone. She saw nurse from [place] this week who told her Amlodipine  might be causing some swelling. She has been elevating her legs. Does feel her sock are more tight and uncomfortable. Her BP at home has been fluctuating per her report.  Taking her Amlodipine in the morning, Carvedilol twice per day, Hydralazine once per day. No chest pain, dyspnea.   EKGs/Labs/Other Studies Reviewed:   The following studies were reviewed today:  ZIO 06/24/22 Patch Wear Time:  12 days and 9 hours (2023-08-08T11:21:57-0400 to 2023-08-20T20:47:57-0400)   Patient had a min HR of 32 bpm, max HR of 136 bpm, and avg HR of 79 bpm. Predominant underlying rhythm was Sinus Rhythm. First Degree AV Block was present. 1 run of Supraventricular Tachycardia occurred lasting 4 beats with a max rate of 122 bpm (avg 115  bpm). Second Degree AV Block-Mobitz II, single  non-conducted P-wave was present. Isolated SVEs were rare (<1.0%), SVE Couplets were rare (<1.0%), and no SVE Triplets were present. Isolated VEs were rare (<1.0%), VE Couplets were rare (<1.0%), and no VE Triplets were  present.   One brief run of SVT lasting 4 beats was noted, otherwise, no significant arrhythmias or ectopy.  One non-conducted beat was noted (Mobitz II).  Echo 07/03/22   1. Left ventricular ejection fraction, by estimation, is 60 to 65%. The  left ventricle has normal function. The left ventricle has no regional  wall motion abnormalities. There is moderate asymmetric left ventricular  hypertrophy of the basal-septal  segment. Left ventricular diastolic parameters were normal. The average  left ventricular global longitudinal strain is -20.2 %.   2. Right ventricular systolic function is normal. The right ventricular  size is normal. There is normal pulmonary artery systolic pressure. The  estimated right ventricular systolic pressure is 32.3 mmHg.   3. The mitral valve is normal in structure. Trivial mitral valve  regurgitation.   4. The aortic valve was not well visualized. Aortic valve regurgitation  is not visualized. No aortic stenosis is present.   5. The inferior vena cava is normal in size with greater than 50%  respiratory variability, suggesting right atrial pressure of 3 mmHg.   Carotid duplex 07/03/22 Summary:  Right Carotid: The extracranial vessels were near-normal with only minimal  wall  thickening or plaque.   Left Carotid: Velocities in the left ICA are consistent with a 1-39%  stenosis.   Vertebrals: Bilateral vertebral arteries demonstrate antegrade flow.  Subclavians: Normal flow hemodynamics were seen in bilateral subclavian  arteries.   EKG:  EKG is not ordered today.  The ekg independently reviewed from 05/09/22 revealed NSR 79 bpm with no acute ST/T wave changes.   Recent Labs: 12/31/2021: Hemoglobin 13.1; Platelets 256 04/07/2022: ALT  14 06/06/2022: TSH 2.610 06/25/2022: BUN 9; Creatinine, Ser 1.08; Potassium 5.0; Sodium 142  Recent Lipid Panel    Component Value Date/Time   CHOL 187 04/07/2022 0906   TRIG 48.0 04/07/2022 0906   HDL 61.50 04/07/2022 0906   CHOLHDL 3 04/07/2022 0906   VLDL 9.6 04/07/2022 0906   LDLCALC 116 (H) 04/07/2022 0906     Home Medications   Current Meds  Medication Sig   acetaminophen (TYLENOL) 500 MG tablet Take 1,000 mg by mouth every 6 (six) hours as needed for headache.   ALPRAZolam (XANAX) 0.5 MG tablet Take 1/2 to 1 tablet (0.25-0.5 mg total) by mouth at bedtime as needed for anxiety.   amLODipine (NORVASC) 10 MG tablet Take 1 tablet by mouth daily.   atorvastatin (LIPITOR) 20 MG tablet TAKE 1 TABLET BY MOUTH ONCE DAILY  baclofen (LIORESAL) 10 MG tablet TAKE 1 TABLET BY MOUTH THREE TIMES DAILY AS NEEDED   betamethasone dipropionate (DIPROLENE) 0.05 % ointment Apply as directed to affected area twice a day. Taper use as able as directed.   betamethasone dipropionate 0.05 % lotion Apply a small amount to affected area once a day   busPIRone (BUSPAR) 5 MG tablet Take 1 tablet by mouth 2  times daily. (Patient taking differently: Take 5 mg by mouth 2 (two) times daily as needed (anxiety).)   cholecalciferol (VITAMIN D3) 25 MCG (1000 UNIT) tablet Take 1,000 Units by mouth daily.   diphenhydrAMINE (BENADRYL) 25 MG tablet Take 25 mg by mouth daily as needed for itching.   Estradiol Acetate (FEMRING) 0.05 MG/24HR RING USE ONE RING VAGINALLY EVERY 3 MONTHS AS DIRECTED   gabapentin (NEURONTIN) 400 MG capsule Take 1 capsule by mouth three times daily.   hydrALAZINE (APRESOLINE) 25 MG tablet Please take every 8 hours as needed for Systolic Blood pressure greater than 180   meclizine (ANTIVERT) 25 MG tablet Take 25 mg by mouth 3 (three) times daily as needed for dizziness.   methocarbamol (ROBAXIN) 500 MG tablet Take 1 tablet by mouth 3 times every day as needed for muscle spasms    neomycin-bacitracin-polymyxin (NEOSPORIN) ointment Apply 1 application topically as needed for wound care.   oxyCODONE-acetaminophen (PERCOCET) 7.5-325 MG tablet Take 1 tablet by mouth daily as needed for severe pain.   sodium chloride 1 g tablet Take 1 tablet by mouth 3 times per day for 3 days.   spironolactone (ALDACTONE) 25 MG tablet Take 1 tablet (25 mg total) by mouth daily.   traMADol (ULTRAM) 50 MG tablet Take 1 tablet by mouth every 6 (six) hours as needed.     Review of Systems      All other systems reviewed and are otherwise negative except as noted above.  Physical Exam    VS:  BP 138/88   Pulse 72   Ht '5\' 5"'$  (1.651 m)   BMI 28.46 kg/m  , BMI Body mass index is 28.46 kg/m.  Wt Readings from Last 3 Encounters:  08/04/22 171 lb (77.6 kg)  06/25/22 168 lb (76.2 kg)  06/20/22 169 lb 6.4 oz (76.8 kg)   GEN: Well nourished, well developed, in no acute distress. HEENT: normal. Neck: Supple, no JVD, carotid bruits, or masses. Cardiac: RRR, no murmurs, rubs, or gallops. No clubbing, cyanosis, edema.  Radials/PT 2+ and equal bilaterally.  Respiratory:  Respirations regular and unlabored, clear to auscultation bilaterally. GI: Soft, nontender, nondistended. MS: No deformity or atrophy. Skin: Warm and dry, no rash. Neuro:  Strength and sensation are intact. Psych: Normal affect. Tearful.   Assessment & Plan    Chest pain in adult - 03/2021 coronary calcium score 0. Cardiac MRI with no evidence of HCM. No recurrent chest pain. No indication for ischemic evaluation.   HTN / LVH - BP not at goal but improved. Renin-aldo level elevated, but aldosterone normal. Subsequent salt loading normal 24h sodium (K, creatinine unfortunately not collected due to lab error). Metanephrines/catecholamines, TSH unremarkable. Previously tested negative for OSA, no snoring. Recommend take Hydralazine '25mg'$  BID as prescribed (presently taking once per day). Reduce Coreg to '25mg'$  BID to follow dosage  guidelines. Reduce Amlodipine to '5mg'$  due to LE edema on '10mg'$  dose. Continue Spironolactone. Discussed to monitor BP at home at least 2 hours after medications and sitting for 5-10 minutes. Check in via MyChart in 2 weeks.  Palpitations / Syncope /  Carotid artery stenosis -  ZIO no significant arrhythmia. Echo on significant valvular abnormality. Carotid duplex with only mild left ICA stenosis 1-39% with plans for repeat duplex in one year. No significant abnormalities to explain syncope. Continue to follow with neurology.  Disposition: Follow up in 4-6 months with Pixie Casino, MD or APP.  Signed, [name], NP 08/06/2022, 11:23 AM [place]

## 2022-08-06 ENCOUNTER — Ambulatory Visit (HOSPITAL_BASED_OUTPATIENT_CLINIC_OR_DEPARTMENT_OTHER): Payer: PPO | Admitting: Family

## 2022-08-06 ENCOUNTER — Other Ambulatory Visit (HOSPITAL_COMMUNITY): Payer: Self-pay

## 2022-08-06 ENCOUNTER — Encounter (HOSPITAL_BASED_OUTPATIENT_CLINIC_OR_DEPARTMENT_OTHER): Payer: Self-pay | Admitting: Family

## 2022-08-06 VITALS — BP 138/88 | HR 72 | Ht 65.0 in

## 2022-08-06 DIAGNOSIS — I6522 Occlusion and stenosis of left carotid artery: Secondary | ICD-10-CM

## 2022-08-06 DIAGNOSIS — I10 Essential (primary) hypertension: Secondary | ICD-10-CM

## 2022-08-06 DIAGNOSIS — I517 Cardiomegaly: Secondary | ICD-10-CM | POA: Diagnosis not present

## 2022-08-06 MED ORDER — AMLODIPINE BESYLATE 5 MG PO TABS
5.0000 mg | ORAL_TABLET | Freq: Every day | ORAL | 3 refills | Status: DC
  Filled 2022-08-06: qty 90, 90d supply, fill #0
  Filled 2022-11-08: qty 90, 90d supply, fill #1

## 2022-08-06 MED ORDER — CARVEDILOL 25 MG PO TABS
25.0000 mg | ORAL_TABLET | Freq: Two times a day (BID) | ORAL | 3 refills | Status: DC
  Filled 2022-08-06: qty 180, 90d supply, fill #0
  Filled 2022-11-08: qty 180, 90d supply, fill #1
  Filled 2023-02-20: qty 180, 90d supply, fill #2
  Filled 2023-05-16: qty 180, 90d supply, fill #3

## 2022-08-06 MED ORDER — HYDRALAZINE HCL 25 MG PO TABS
25.0000 mg | ORAL_TABLET | Freq: Two times a day (BID) | ORAL | 3 refills | Status: DC
  Filled 2022-08-06: qty 180, 90d supply, fill #0
  Filled 2022-11-08: qty 180, 90d supply, fill #1
  Filled 2023-02-21: qty 180, 90d supply, fill #2

## 2022-08-06 NOTE — Patient Instructions (Addendum)
Medication Instructions:   Your physician has recommended you make the following change in your medication:   REDUCE Amlodipine to '5mg'$  daily *may use up your '10mg'$  tablets by taking a half tablet daily *we have sent in the '5mg'$  tablet to your pharmacy  CHANGE Hydralazine to '25mg'$  one tablet twice daily   REDUCE Carvedilol to one tablet ('25mg'$ ) twice daily  *If you need a refill on your cardiac medications before your next appointment, please call your pharmacy*  Lab Work/Testing/Procedures: None ordered today.   Follow-Up: At [facility], you and your health needs are our priority.  As part of our continuing mission to provide you with exceptional heart care, we have created designated Provider Care Teams.  These Care Teams include your primary Cardiologist (physician) and Advanced Practice Providers (APPs -  Physician Assistants and Nurse Practitioners) who all work together to provide you with the care you need, when you need it.  We recommend signing up for the patient portal called "MyChart".  Sign up information is provided on this After Visit Summary.  MyChart is used to connect with patients for Virtual Visits (Telemedicine).  Patients are able to view lab/test results, encounter notes, upcoming appointments, etc.  Non-urgent messages can be sent to your provider as well.   To learn more about what you can do with MyChart, go to [email or web address].    Your next appointment:   4-6 month(s)  The format for your next appointment:   In Person  Provider:   Raliegh Ip Mali Hilty, MD or Laurann Montana, NP    Other Instructions  Heart Healthy Diet Recommendations: A low-salt diet is recommended. Meats should be grilled, baked, or boiled. Avoid fried foods. Focus on lean protein sources like fish or chicken with vegetables and fruits. The American Heart Association is a Microbiologist!  American Heart Association Diet and Lifeystyle Recommendations   Exercise  recommendations: The American Heart Association recommends 150 minutes of moderate intensity exercise weekly. Try 30 minutes of moderate intensity exercise 4-5 times per week. This could include walking, jogging, or swimming.  To prevent or reduce lower extremity swelling: Eat a low salt diet. Salt makes the body hold onto extra fluid which causes swelling. Sit with legs elevated. For example, in the recliner or on an [place].  Wear knee-high compression stockings during the daytime. Ones labeled 15-20 mmHg provide good compression.   Important Information About Sugar

## 2022-08-07 ENCOUNTER — Ambulatory Visit (HOSPITAL_BASED_OUTPATIENT_CLINIC_OR_DEPARTMENT_OTHER): Payer: PPO | Admitting: Family

## 2022-08-20 ENCOUNTER — Encounter (HOSPITAL_BASED_OUTPATIENT_CLINIC_OR_DEPARTMENT_OTHER): Payer: Self-pay

## 2022-08-22 ENCOUNTER — Other Ambulatory Visit (HOSPITAL_COMMUNITY): Payer: Self-pay

## 2022-08-22 ENCOUNTER — Other Ambulatory Visit: Payer: Self-pay | Admitting: Family Medicine

## 2022-08-22 DIAGNOSIS — F419 Anxiety disorder, unspecified: Secondary | ICD-10-CM

## 2022-08-25 ENCOUNTER — Other Ambulatory Visit (HOSPITAL_COMMUNITY): Payer: Self-pay

## 2022-08-25 ENCOUNTER — Telehealth: Payer: PPO

## 2022-08-25 NOTE — Telephone Encounter (Signed)
Last filled 06/20/22 Next OV 09/29/22

## 2022-08-26 ENCOUNTER — Other Ambulatory Visit (HOSPITAL_COMMUNITY): Payer: Self-pay

## 2022-08-26 DIAGNOSIS — E785 Hyperlipidemia, unspecified: Secondary | ICD-10-CM

## 2022-08-26 DIAGNOSIS — I10 Essential (primary) hypertension: Secondary | ICD-10-CM | POA: Diagnosis not present

## 2022-08-26 DIAGNOSIS — F32A Depression, unspecified: Secondary | ICD-10-CM | POA: Diagnosis not present

## 2022-08-26 MED ORDER — ALPRAZOLAM 0.5 MG PO TABS
0.2500 mg | ORAL_TABLET | Freq: Every evening | ORAL | 2 refills | Status: DC | PRN
  Filled 2022-08-26: qty 30, 30d supply, fill #0
  Filled 2022-11-25: qty 30, 30d supply, fill #1
  Filled 2023-01-23: qty 30, 30d supply, fill #2

## 2022-08-29 ENCOUNTER — Telehealth: Payer: Self-pay | Admitting: Pharmacist

## 2022-08-29 NOTE — Progress Notes (Signed)
Chronic Care Management Pharmacy Assistant   Name: [name]  MRN: [id number] DOB: 03-16-1961  Reason for Encounter: Disease State/ Anxiety per Jeni Salles  Recent office visits:  None  Recent consult visits:  08/06/22 [name] NP (Cardiology) - Patient presented for Essential hypertension and other concerns. Decreased Carvedilol. Changed Hydralazine.Stopped Sodium Chloride.  Hospital visits:  Medication Reconciliation was completed by comparing discharge summary, patient's EMR and Pharmacy list, and upon discussion with patient.  Patient presented to [facility] ED on 06/16/22 due to Loss of Consciousness. Patient was present for 1 hour.  Medications: Outpatient Encounter Medications as of 08/29/2022  Medication Sig Note   acetaminophen (TYLENOL) 500 MG tablet Take 1,000 mg by mouth every 6 (six) hours as needed for headache.    ALPRAZolam (XANAX) 0.5 MG tablet Take 0.5-1 tablets (0.25-0.5 mg total) by mouth at bedtime as needed for anxiety.    amLODipine (NORVASC) 5 MG tablet Take 1 tablet (5 mg total) by mouth daily.    atorvastatin (LIPITOR) 20 MG tablet TAKE 1 TABLET BY MOUTH ONCE DAILY    baclofen (LIORESAL) 10 MG tablet TAKE 1 TABLET BY MOUTH THREE TIMES DAILY AS NEEDED    betamethasone dipropionate (DIPROLENE) 0.05 % ointment Apply as directed to affected area twice a day. Taper use as able as directed.    betamethasone dipropionate 0.05 % lotion Apply a small amount to affected area once a day    busPIRone (BUSPAR) 5 MG tablet Take 1 tablet by mouth 2  times daily. (Patient taking differently: Take 5 mg by mouth 2 (two) times daily as needed (anxiety).)    carvedilol (COREG) 25 MG tablet Take 1 tablet (25 mg total) by mouth 2 (two) times daily with a meal.    cholecalciferol (VITAMIN D3) 25 MCG (1000 UNIT) tablet Take 1,000 Units by mouth daily.    diphenhydrAMINE (BENADRYL) 25 MG tablet Take 25 mg by mouth daily as needed for  itching.    Estradiol Acetate (FEMRING) 0.05 MG/24HR RING USE ONE RING VAGINALLY EVERY 3 MONTHS AS DIRECTED    gabapentin (NEURONTIN) 400 MG capsule Take 1 capsule by mouth three times daily.    hydrALAZINE (APRESOLINE) 25 MG tablet Take 1 tablet (25 mg total) by mouth in the morning and at bedtime.    meclizine (ANTIVERT) 25 MG tablet Take 25 mg by mouth 3 (three) times daily as needed for dizziness.    methocarbamol (ROBAXIN) 500 MG tablet Take 1 tablet by mouth 3 times every day as needed for muscle spasms    neomycin-bacitracin-polymyxin (NEOSPORIN) ointment Apply 1 application topically as needed for wound care.    oxyCODONE-acetaminophen (PERCOCET) 7.5-325 MG tablet Take 1 tablet by mouth daily as needed for severe pain. 06/25/2022: NOT FROM [facility]  [facility] 06/25/22   spironolactone (ALDACTONE) 25 MG tablet Take 1 tablet (25 mg total) by mouth daily.    traMADol (ULTRAM) 50 MG tablet Take 1 tablet by mouth every 6 (six) hours as needed. 06/25/2022: NOT FROM [facility]  [facility] 06/25/22   No facility-administered encounter medications on file as of 08/29/2022.  Notes: Per Jeni Salles call to patient to inquire if she has been talking her buspirone twice daily as prescribed, if it has been helping her and if she has needed to use her xanax less. Patient reports she has been using it twice daily and has noted that she has been using the xanax less. Patient further reports her pressures have also been coming  down as well as her swelling since seeing Cardiology. She states she has also had a fall but caught herself thinks her legs are giving out more frequently, she states she has a follow up with Dr Tessa Lerner on next month.  Care Gaps: AWV- Scheduled 11/23 last 11/22 Bp- 138/88 08/06/22  Star Rating Drugs: Atorvastatin (Lipitor) 20 mg - Last filled 07/14/22 90 DS at [facility] Pharmacist Assistant [phone]

## 2022-09-01 ENCOUNTER — Ambulatory Visit (AMBULATORY_SURGERY_CENTER): Payer: Self-pay

## 2022-09-01 ENCOUNTER — Other Ambulatory Visit (HOSPITAL_COMMUNITY): Payer: Self-pay

## 2022-09-01 VITALS — Ht 65.0 in | Wt 168.0 lb

## 2022-09-01 DIAGNOSIS — Z86010 Personal history of colonic polyps: Secondary | ICD-10-CM

## 2022-09-01 MED ORDER — PEG 3350-KCL-NA BICARB-NACL 420 G PO SOLR
4000.0000 mL | Freq: Once | ORAL | 0 refills | Status: AC
  Filled 2022-09-01: qty 4000, 1d supply, fill #0

## 2022-09-01 MED ORDER — ONDANSETRON HCL 4 MG PO TABS
4.0000 mg | ORAL_TABLET | ORAL | 0 refills | Status: DC
  Filled 2022-09-01: qty 2, 2d supply, fill #0

## 2022-09-01 NOTE — Progress Notes (Signed)
No egg or soy allergy known to patient  No issues known to pt with past sedation with any surgeries or procedures Patient denies ever being told they had issues or difficulty with intubation  No FH of Malignant Hyperthermia Pt is not on diet pills Pt is not on  home 02  Pt is not on blood thinners  Pt denies issues with constipation  No A fib or A flutter Have any cardiac testing pending--no Pt instructed to use Singlecare.com or GoodRx for a price reduction on prep  Patient's chart reviewed by John Nulty CNRA prior to previsit and patient appropriate for the [facility].  Previsit completed and red dot placed by patient's name on their procedure day (on provider's schedule).    

## 2022-09-02 ENCOUNTER — Ambulatory Visit: Payer: PPO

## 2022-09-08 ENCOUNTER — Other Ambulatory Visit (HOSPITAL_COMMUNITY): Payer: Self-pay

## 2022-09-08 ENCOUNTER — Other Ambulatory Visit: Payer: Self-pay | Admitting: Family Medicine

## 2022-09-09 ENCOUNTER — Other Ambulatory Visit (HOSPITAL_COMMUNITY): Payer: Self-pay

## 2022-09-10 ENCOUNTER — Other Ambulatory Visit (HOSPITAL_COMMUNITY): Payer: Self-pay

## 2022-09-11 ENCOUNTER — Telehealth: Payer: Self-pay

## 2022-09-11 ENCOUNTER — Other Ambulatory Visit (HOSPITAL_COMMUNITY): Payer: Self-pay

## 2022-09-11 NOTE — Telephone Encounter (Signed)
[name] is no longer a patient of Dr. Nelva Bush. She said you were going to take over the Gabapentin & Tramadol scripts. She is currently out of Gabapentin.   If granted please send it to the [facility]. Thank you.

## 2022-09-12 ENCOUNTER — Other Ambulatory Visit (HOSPITAL_COMMUNITY): Payer: Self-pay

## 2022-09-12 MED ORDER — GABAPENTIN 400 MG PO CAPS
ORAL_CAPSULE | ORAL | 6 refills | Status: DC
  Filled 2022-09-12: qty 90, 30d supply, fill #0
  Filled 2022-10-08: qty 90, 30d supply, fill #1

## 2022-09-12 NOTE — Addendum Note (Signed)
Addended by: Alger Simons T on: 09/12/2022 08:45 AM   Modules accepted: Orders

## 2022-09-12 NOTE — Telephone Encounter (Signed)
Rx written and sent to the pharmacy. Thanks!  

## 2022-09-16 ENCOUNTER — Other Ambulatory Visit (HOSPITAL_COMMUNITY): Payer: Self-pay

## 2022-09-16 MED ORDER — METHOCARBAMOL 500 MG PO TABS
500.0000 mg | ORAL_TABLET | Freq: Three times a day (TID) | ORAL | 1 refills | Status: DC | PRN
  Filled 2022-09-16: qty 30, 10d supply, fill #0
  Filled 2022-11-08: qty 30, 10d supply, fill #1

## 2022-09-26 ENCOUNTER — Encounter: Payer: Self-pay | Admitting: Gastroenterology

## 2022-09-26 NOTE — Progress Notes (Unsigned)
HPI: [name] is a 61 y.o. female, who is here today for chronic disease management.  Last seen on 04/07/22.  She reports a recent loss of two family members since 04/2022, which has caused significant emotional distress.  She reports experiencing chest tightness, which she has had intermittent for some time now but recently accompanied by pain radiating down her arm. She describes the tightness as occurring during periods of stress,after eating, crying, and even when sitting watching TV.  No associated SOB,palpitations, or heartburn.  She expresses concern about her symptoms and fears that they may be caused heart issues, as her mother had a heart attack and passed away.   She has not called her cardiologist, states that cardiac work up has been otherwise negative and she thinks it is caused by stress.  HTN on Hydralazine 25 mg tid,Spironolactone 25 mg daily, Carvedilol 25 mg bid,and Amlodipine 5 mg daily. Renal function has been mildly abnormal since 04/2022, e GFR 53-59 and Cr 1.08-1.17.  Negative for severe/frequent headache, visual changes, new focal weakness, or edema. HLD on Atorvastatin 20 mg daily.  Lab Results  Component Value Date   CHOL 187 04/07/2022   HDL 61.50 04/07/2022   LDLCALC 116 (H) 04/07/2022   TRIG 48.0 04/07/2022   CHOLHDL 3 04/07/2022   She is also concerned about RUE weakness, from shoulder to hand,which has affected her grip strength. + Numbness and tingling, both UE's. Negative for rash,edema,or erythema. He of cervical radiculopathy. States that she had problem with LUE and had cervical spine surgery.  She is on Gabapentin 400 mg tid. Chronic pain on Tramadol, Percocet ,and Methocarbamol. Follows with neurosurgeon.  Anxiety: She was taking Buspar 5 mg bid as needed but for the past few months she has taken it bid. She is also on Alprazolam 0.5 mg daily as needed. She is also on therapy with her pastor, who is a Press photographer.   She also mentions recent episodes of sleepwalking, which her husband has observed but she does not remember. Negative for new medications or witnessed sleep apnea. She attributes these episodes to the emotional stress she has been experiencing.    09/29/2022   11:37 AM 09/29/2022    9:01 AM 04/07/2022    8:38 AM 11/27/2021    9:50 AM 10/04/2021    8:35 AM  Depression screen PHQ 2/9  Decreased Interest 0 2 0 0 0  Down, Depressed, Hopeless 0 0 1 0 0  PHQ - 2 Score 0 2 1 0 0  Altered sleeping 0 1 1  0  Tired, decreased energy 0 0 1  0  Change in appetite 0 0 1  0  Feeling bad or failure about yourself  0 0 0  0  Trouble concentrating 0 0 0  0  Moving slowly or fidgety/restless 0 0 0  0  Suicidal thoughts 0 0 0  0  PHQ-9 Score 0 3 4  0  Difficult doing work/chores Not difficult at all Not difficult at all Somewhat difficult     Review of Systems  Constitutional:  Positive for fatigue. Negative for activity change, diaphoresis and fever.  HENT:  Negative for mouth sores, nosebleeds and trouble swallowing.   Eyes:  Negative for redness and visual disturbance.  Respiratory:  Negative for cough and wheezing.   Gastrointestinal:  Negative for abdominal pain, nausea and vomiting.       Negative for changes in bowel habits.  Endocrine: Negative for cold intolerance and heat intolerance.  Genitourinary:  Negative for decreased urine volume, dysuria and hematuria.  Neurological:  Negative for syncope and facial asymmetry.  Psychiatric/Behavioral:  Negative for confusion. The patient is nervous/anxious.   See other pertinent positives and negatives in HPI.  Current Outpatient Medications on File Prior to Visit  Medication Sig Dispense Refill   acetaminophen (TYLENOL) 500 MG tablet Take 1,000 mg by mouth every 6 (six) hours as needed for headache.     ALPRAZolam (XANAX) 0.5 MG tablet Take 0.5-1 tablets (0.25-0.5 mg total) by mouth at bedtime as needed for anxiety. 30 tablet 2   amLODipine  (NORVASC) 5 MG tablet Take 1 tablet (5 mg total) by mouth daily. 90 tablet 3   atorvastatin (LIPITOR) 20 MG tablet TAKE 1 TABLET BY MOUTH ONCE DAILY 90 tablet 2   baclofen (LIORESAL) 10 MG tablet TAKE 1 TABLET BY MOUTH THREE TIMES DAILY AS NEEDED 90 tablet 3   betamethasone dipropionate (DIPROLENE) 0.05 % ointment Apply as directed to affected area twice a day. Taper use as able as directed. 45 g 3   betamethasone dipropionate 0.05 % lotion Apply a small amount to affected area once a day 60 mL 11   busPIRone (BUSPAR) 5 MG tablet Take 1 tablet by mouth 2  times daily. (Patient taking differently: Take 5 mg by mouth 2 (two) times daily as needed (anxiety).) 180 tablet 1   carvedilol (COREG) 25 MG tablet Take 1 tablet (25 mg total) by mouth 2 (two) times daily with a meal. 180 tablet 3   cholecalciferol (VITAMIN D3) 25 MCG (1000 UNIT) tablet Take 1,000 Units by mouth daily.     diphenhydrAMINE (BENADRYL) 25 MG tablet Take 25 mg by mouth daily as needed for itching.     Estradiol Acetate (FEMRING) 0.05 MG/24HR RING USE ONE RING VAGINALLY EVERY 3 MONTHS AS DIRECTED 1 each 4   gabapentin (NEURONTIN) 400 MG capsule Take 1 capsule by mouth three times daily. 90 capsule 6   hydrALAZINE (APRESOLINE) 25 MG tablet Take 1 tablet (25 mg total) by mouth in the morning and at bedtime. 180 tablet 3   meclizine (ANTIVERT) 25 MG tablet Take 25 mg by mouth 3 (three) times daily as needed for dizziness.     methocarbamol (ROBAXIN) 500 MG tablet Take 1 tablet (500 mg total) by mouth 3 (three) times daily as needed for muscle spasms. 30 tablet 1   neomycin-bacitracin-polymyxin (NEOSPORIN) ointment Apply 1 application topically as needed for wound care.     ondansetron (ZOFRAN) 4 MG tablet Take 1 tablet (4 mg tablet) 30 - 60 minutes each prep dose 2 tablet 0   oxyCODONE-acetaminophen (PERCOCET) 7.5-325 MG tablet Take 1 tablet by mouth daily as needed for severe pain.     spironolactone (ALDACTONE) 25 MG tablet Take 1  tablet (25 mg total) by mouth daily. 90 tablet 3   traMADol (ULTRAM) 50 MG tablet Take 1 tablet by mouth every 6 (six) hours as needed. 30 tablet 2   No current facility-administered medications on file prior to visit.   Past Medical History:  Diagnosis Date   Anxiety    Aortic atherosclerosis ([place]) 04/16/2021   Atypical angina    Back pain    related to spinal stenosis and disc problem, radiates down left buttocks to leg., weakness occ.   Chest pain    a. 03/2015 Cath: nl cors; b. 03/2021 Cor CTA: Ca2+ = 0. Nl Cors.   Chronic pain syndrome    Dyspnea    GERD (gastroesophageal  reflux disease)    Grade I diastolic dysfunction    Headache    Hyperlipidemia    Hypertension    Lumbar post-laminectomy syndrome    LVH (left ventricular hypertrophy) 12/15/2020   a. 11/2020 Echo: EF 65-70%, no rwma, sev asymm LVH with IVSd 1.9 cm. No LVOT obs @ rest. Gr1 DD. Triv MR.   PONV (postoperative nausea and vomiting)    Pulmonary nodules    a. 03/2021 CT Chest: 68m pulm nodules in bilat lower lobes. F/u 1 yr.   Right foot drop    Syncope    a. 11/2020 Zio: No significant arrhythmias.   Vaginal foreign object    "Uses Femring"   Allergies  Allergen Reactions   Cephalosporins Anaphylaxis   Penicillins Anaphylaxis and Hives    Did it involve swelling of the face/tongue/throat, SOB, or low BP? Yes Did it involve sudden or severe rash/hives, skin peeling, or any reaction on the inside of your mouth or nose? Yes Did you need to seek medical attention at a hospital or doctor's office? Yes When did it last happen?      10+ years If all above answers are "NO", may proceed with cephalosporin use.     Anesthetics, Amide Nausea And Vomiting    Does not know name of it. States they put it on record foot center.    Betadine [Povidone Iodine] Other (See Comments)    "Skin Burn" caused scar on Left buttock   Latex Hives, Itching, Rash and Other (See Comments)   Peach [Prunus Persica] Hives   Claritin  [Loratadine]     Rash, pruritis     Social History   Socioeconomic History   Marital status: Married    Spouse name: MNelida Gores  Number of children: 1   Years of education: Not on file   Highest education level: Not on file  Occupational History   Not on file  Tobacco Use   Smoking status: Never   Smokeless tobacco: Never  Vaping Use   Vaping Use: Never used  Substance and Sexual Activity   Alcohol use: Never   Drug use: Never   Sexual activity: Yes    Partners: Male    Birth control/protection: Post-menopausal, Surgical  Other Topics Concern   Not on file  Social History Narrative   Not on file   Social Determinants of Health   Financial Resource Strain: Low Risk  (09/29/2022)   Overall Financial Resource Strain (CARDIA)    Difficulty of Paying Living Expenses: Not hard at all  Food Insecurity: No Food Insecurity (09/29/2022)   Hunger Vital Sign    Worried About Running Out of Food in the Last Year: Never true    RColwellin the Last Year: Never true  Transportation Needs: No Transportation Needs (09/29/2022)   PRAPARE - THydrologist(Medical): No    Lack of Transportation (Non-Medical): No  Physical Activity: Inactive (09/29/2022)   Exercise Vital Sign    Days of Exercise per Week: 0 days    Minutes of Exercise per Session: 0 min  Stress: No Stress Concern Present (09/29/2022)   [place]   Feeling of Stress : Not at all  Social Connections: [place](09/29/2022)   Social Connection and Isolation Panel [NHANES]    Frequency of Communication with Friends and Family: More than three times a week    Frequency of Social Gatherings with  Friends and Family: More than three times a week    Attends Religious Services: More than 4 times per year    Active Member of Clubs or Organizations: Yes    Attends Archivist Meetings: More than 4 times per  year    Marital Status: Married   Vitals:   09/29/22 0857  BP: 120/80  Pulse: 77  Resp: 16  SpO2: 98%  Body mass index is 27.96 kg/m.  Physical Exam Vitals and nursing note reviewed.  Constitutional:      General: She is not in acute distress.    Appearance: She is well-developed.  HENT:     Head: Normocephalic and atraumatic.     Mouth/Throat:     Mouth: Mucous membranes are moist.     Pharynx: Oropharynx is clear.  Eyes:     Conjunctiva/sclera: Conjunctivae normal.  Cardiovascular:     Rate and Rhythm: Normal rate and regular rhythm.     Pulses:          Posterior tibial pulses are 2+ on the right side and 2+ on the left side.     Heart sounds: No murmur heard. Pulmonary:     Effort: Pulmonary effort is normal. No respiratory distress.     Breath sounds: Normal breath sounds.  Chest:     Chest wall: Tenderness (Left parasternal with palpation and deep breathing.) present.  Abdominal:     Palpations: Abdomen is soft. There is no mass.     Tenderness: There is no abdominal tenderness.  Musculoskeletal:     Right lower leg: No edema.     Left lower leg: No edema.  Skin:    General: Skin is warm.     Findings: No erythema or rash.  Neurological:     General: No focal deficit present.     Mental Status: She is alert and oriented to person, place, and time.     Cranial Nerves: No cranial nerve deficit.     Gait: Gait normal.     Comments: Right foot drop, gait assisted with a cane.  Psychiatric:        Mood and Affect: Mood is anxious. Affect is tearful.   ASSESSMENT AND PLAN:  [name] was seen today for follow-up.  Diagnoses and all orders for this visit: Orders Placed This Encounter  Procedures   Microalbumin / creatinine urine ratio   Basic metabolic panel   Ambulatory referral to Psychiatry   Lab Results  Component Value Date   CREATININE 1.05 09/29/2022   BUN 10 09/29/2022   NA 139 09/29/2022   K 4.6 09/29/2022   CL 104 09/29/2022   CO2 31  09/29/2022   Lab Results  Component Value Date   MICROALBUR 1.8 09/29/2022   Sleep disorder, unspecified Reports that her husband witnessed episodes of walking around bedroom, she does not remember event and has not happened before. She is very concerned, suggest a sleep study to evaluate for sleep apnea or other sleep disorders but she prefers to hold on this.  Essential hypertension BP adequately controlled. Continue Hydralazine 25 mg tid,Spironolactone 25 mg daily, Carvedilol 25 mg bid,and Amlodipine 5 mg daily. Low salt diet to continue. Eye exam is current.  Hyperlipidemia LDL 116. Continue Atorvastatin 20 mg daily and low fat diet.   GAD (generalized anxiety disorder) Problem is not well controlled. Recommend establishing with psychiatrist. For now continue Buspirone 5 mg bid and Alprazolam 0.5 mg daily. Continue CBT.  Chest tightness Intermittent. ? Musculoskeletal.  Today tenderness with palpation of costochondral joints, left side. Coronary calcium score 0 in 03/2021. Cardiac monitor negative for significant arrhythmia. We discussed possible etiologies, anxiety can certainly be a contributing factor but she needs to arrange f/u appt if problem becomes more frequent or intense. Clearly instructed about warning signs.  Right upper extremity numbness We discussed possible etiologies.  ? Cervical radiculopathy. Cervical MRI done in 04/2019:Multilevel cervical spondylosis, most pronounced at the C5-6 level where there is moderate canal stenosis and mild bilateral foraminal stenosis. Instructed to arrange follow up appt with neurosurgeon.   Stage 3a chronic kidney disease (HCC) It has been stable sine 04/2022. We discussed Dx criteria for CKD. Continue adequate hydration, low salt diet,and avoidance of NSAID's. Continue Losartan 100 mg daily.  I spent a total of 52 minutes in both face to face and non face to face activities for this visit on the date of this encounter.  During this time history was obtained and documented, examination was performed, prior labs/imaging reviewed, and assessment/plan discussed.  Return in about 6 months (around 03/31/2023) for chronic problems.  [name] G. [name], MD  [facility]. [place] office.

## 2022-09-29 ENCOUNTER — Ambulatory Visit (INDEPENDENT_AMBULATORY_CARE_PROVIDER_SITE_OTHER): Payer: PPO | Admitting: Family Medicine

## 2022-09-29 ENCOUNTER — Ambulatory Visit: Payer: PPO

## 2022-09-29 ENCOUNTER — Ambulatory Visit (INDEPENDENT_AMBULATORY_CARE_PROVIDER_SITE_OTHER): Payer: PPO

## 2022-09-29 ENCOUNTER — Encounter: Payer: Self-pay | Admitting: Family Medicine

## 2022-09-29 VITALS — BP 120/80 | HR 77 | Resp 16 | Ht 65.0 in

## 2022-09-29 VITALS — Ht 65.0 in | Wt 171.0 lb

## 2022-09-29 DIAGNOSIS — I10 Essential (primary) hypertension: Secondary | ICD-10-CM

## 2022-09-29 DIAGNOSIS — R200 Anesthesia of skin: Secondary | ICD-10-CM | POA: Diagnosis not present

## 2022-09-29 DIAGNOSIS — F411 Generalized anxiety disorder: Secondary | ICD-10-CM

## 2022-09-29 DIAGNOSIS — N1831 Chronic kidney disease, stage 3a: Secondary | ICD-10-CM | POA: Insufficient documentation

## 2022-09-29 DIAGNOSIS — G479 Sleep disorder, unspecified: Secondary | ICD-10-CM | POA: Diagnosis not present

## 2022-09-29 DIAGNOSIS — R0789 Other chest pain: Secondary | ICD-10-CM

## 2022-09-29 DIAGNOSIS — E785 Hyperlipidemia, unspecified: Secondary | ICD-10-CM | POA: Diagnosis not present

## 2022-09-29 DIAGNOSIS — Z0000 Encounter for general adult medical examination without abnormal findings: Secondary | ICD-10-CM | POA: Diagnosis not present

## 2022-09-29 LAB — BASIC METABOLIC PANEL
BUN: 10 mg/dL (ref 6–23)
CO2: 31 mEq/L (ref 19–32)
Calcium: 9.1 mg/dL (ref 8.4–10.5)
Chloride: 104 mEq/L (ref 96–112)
Creatinine, Ser: 1.05 mg/dL (ref 0.40–1.20)
GFR: 57.49 mL/min — ABNORMAL LOW (ref >60.00)
Glucose, Bld: 89 mg/dL (ref 70–99)
Potassium: 4.6 mEq/L (ref 3.5–5.1)
Sodium: 139 mEq/L (ref 135–145)

## 2022-09-29 LAB — MICROALBUMIN / CREATININE URINE RATIO
Creatinine,U: 269.2 mg/dL
Microalb Creat Ratio: 0.7 mg/g (ref 0.0–30.0)
Microalb, Ur: 1.8 mg/dL (ref 0.0–1.9)

## 2022-09-29 NOTE — Assessment & Plan Note (Signed)
Problem is not well controlled. Recommend establishing with psychiatrist. For now continue Buspirone 5 mg bid and Alprazolam 0.5 mg daily. Continue CBT.

## 2022-09-29 NOTE — Assessment & Plan Note (Signed)
BP adequately controlled. Continue Hydralazine 25 mg tid,Spironolactone 25 mg daily, Carvedilol 25 mg bid,and Amlodipine 5 mg daily. Low salt diet to continue. Eye exam is current.

## 2022-09-29 NOTE — Patient Instructions (Addendum)
A few things to remember from today's visit:  Essential hypertension - Plan: Basic metabolic panel  Hyperlipidemia, unspecified hyperlipidemia type  Chest tightness  Right upper extremity numbness  GAD (generalized anxiety disorder) - Plan: Ambulatory referral to Psychiatry  Abnormal renal function test - Plan: Microalbumin / creatinine urine ratio  Right arm numbness and weakness sensation could be related to cervical spine stenosis, so please arrange appt with your neurosurgeon. No changes in your medications today. If you have another episode of chest pain radiated to left arm, tou need to seek immediate medical attention.   If you need refills for medications you take chronically, please call your pharmacy. Do not use My Chart to request refills or for acute issues that need immediate attention. If you send a my chart message, it may take a few days to be addressed, specially if I am not in the office.  Please be sure medication list is accurate. If a new problem present, please set up appointment sooner than planned today.

## 2022-09-29 NOTE — Progress Notes (Signed)
Subjective:   [name] is a 61 y.o. female who presents for Medicare Annual (Subsequent) preventive examination.  Review of Systems    Virtual Visit via Telephone Note  I connected with  [name] on 09/29/22 at 11:30 AM EST by telephone and verified that I am speaking with the correct person using two identifiers.  Location: Patient: Home Provider: Office Persons participating in the virtual visit: patient/Nurse Health Advisor   I discussed the limitations, risks, security and privacy concerns of performing an evaluation and management service by telephone and the availability of in person appointments. The patient expressed understanding and agreed to proceed.  Interactive audio and video telecommunications were attempted between this nurse and patient, however failed, due to patient having technical difficulties OR patient did not have access to video capability.  We continued and completed visit with audio only.  Some vital signs may be absent or patient reported.   Criselda Peaches, LPN  Cardiac Risk Factors include: advanced age (>51mn, >>40women);hypertension     Objective:    Today's Vitals   09/29/22 1130  Weight: 171 lb (77.6 kg)  Height: _0  (1.651 m)   Body mass index is 28.46 kg/m.     09/29/2022   11:41 AM 12/31/2021    9:08 AM 09/12/2021   11:43 AM 07/12/2021    4:17 PM 05/28/2021    5:00 PM 05/13/2021    6:06 PM 05/13/2021    7:26 AM  Advanced Directives  Does Patient Have a Medical Advance Directive? _1  No No  Would patient like information on creating a medical advance directive? No - Patient declined No - Patient declined No - Patient declined No - Patient declined No - Patient declined No - Patient declined     Current Medications (verified) Outpatient Encounter Medications as of 09/29/2022  Medication Sig   acetaminophen (TYLENOL) 500 MG tablet Take 1,000 mg by mouth every 6 (six) hours as needed for  headache.   ALPRAZolam (XANAX) 0.5 MG tablet Take 0.5-1 tablets (0.25-0.5 mg total) by mouth at bedtime as needed for anxiety.   amLODipine (NORVASC) 5 MG tablet Take 1 tablet (5 mg total) by mouth daily.   atorvastatin (LIPITOR) 20 MG tablet TAKE 1 TABLET BY MOUTH ONCE DAILY   baclofen (LIORESAL) 10 MG tablet TAKE 1 TABLET BY MOUTH THREE TIMES DAILY AS NEEDED   betamethasone dipropionate (DIPROLENE) 0.05 % ointment Apply as directed to affected area twice a day. Taper use as able as directed.   betamethasone dipropionate 0.05 % lotion Apply a small amount to affected area once a day   busPIRone (BUSPAR) 5 MG tablet Take 1 tablet by mouth 2  times daily. (Patient taking differently: Take 5 mg by mouth 2 (two) times daily as needed (anxiety).)   carvedilol (COREG) 25 MG tablet Take 1 tablet (25 mg total) by mouth 2 (two) times daily with a meal.   cholecalciferol (VITAMIN D3) 25 MCG (1000 UNIT) tablet Take 1,000 Units by mouth daily.   diphenhydrAMINE (BENADRYL) 25 MG tablet Take 25 mg by mouth daily as needed for itching.   Estradiol Acetate (FEMRING) 0.05 MG/24HR RING USE ONE RING VAGINALLY EVERY 3 MONTHS AS DIRECTED   gabapentin (NEURONTIN) 400 MG capsule Take 1 capsule by mouth three times daily.   hydrALAZINE (APRESOLINE) 25 MG tablet Take 1 tablet (25 mg total) by mouth in the morning and at bedtime.   meclizine (ANTIVERT) 25 MG tablet Take 25  mg by mouth 3 (three) times daily as needed for dizziness.   methocarbamol (ROBAXIN) 500 MG tablet Take 1 tablet (500 mg total) by mouth 3 (three) times daily as needed for muscle spasms.   neomycin-bacitracin-polymyxin (NEOSPORIN) ointment Apply 1 application topically as needed for wound care.   ondansetron (ZOFRAN) 4 MG tablet Take 1 tablet (4 mg tablet) 30 - 60 minutes each prep dose   oxyCODONE-acetaminophen (PERCOCET) 7.5-325 MG tablet Take 1 tablet by mouth daily as needed for severe pain.   spironolactone (ALDACTONE) 25 MG tablet Take 1 tablet  (25 mg total) by mouth daily.   traMADol (ULTRAM) 50 MG tablet Take 1 tablet by mouth every 6 (six) hours as needed.   No facility-administered encounter medications on file as of 09/29/2022.    Allergies (verified) Cephalosporins; Penicillins; Anesthetics, amide; Betadine [povidone iodine]; Latex; Peach [prunus persica]; and Claritin [loratadine]   History: Past Medical History:  Diagnosis Date   Anxiety    Aortic atherosclerosis ([place]) 04/16/2021   Atypical angina    Back pain    related to spinal stenosis and disc problem, radiates down left buttocks to leg., weakness occ.   Chest pain    a. 03/2015 Cath: nl cors; b. 03/2021 Cor CTA: Ca2+ = 0. Nl Cors.   Chronic pain syndrome    Dyspnea    GERD (gastroesophageal reflux disease)    Grade I diastolic dysfunction    Headache    Hyperlipidemia    Hypertension    Lumbar post-laminectomy syndrome    LVH (left ventricular hypertrophy) 12/15/2020   a. 11/2020 Echo: EF 65-70%, no rwma, sev asymm LVH with IVSd 1.9 cm. No LVOT obs @ rest. Gr1 DD. Triv MR.   PONV (postoperative nausea and vomiting)    Pulmonary nodules    a. 03/2021 CT Chest: 20m pulm nodules in bilat lower lobes. F/u 1 yr.   Right foot drop    Syncope    a. 11/2020 Zio: No significant arrhythmias.   Vaginal foreign object    "Uses Femring"   Past Surgical History:  Procedure Laterality Date   ABDOMINAL HYSTERECTOMY     ANTERIOR CERVICAL DECOMP/DISCECTOMY FUSION N/A 01/03/2022   Procedure: Anterior Cervical Decompression Fusion - Cervical four-Cervical five - Cervical five-Cervical six;  Surgeon: JEustace Moore MD;  [place]  Service: Neurosurgery;  Laterality: N/A;   BIOPSY  12/16/2020   Procedure: BIOPSY;  Surgeon: SLadene Artist MD;  [place] [facility] [facility]  Service: Endoscopy;;   CARDIAC CATHETERIZATION N/A 04/18/2015   Procedure: Left Heart Cath and Coronary Angiography;  Surgeon: MCharolette Forward MD;  [facility];  Service:  Cardiovascular;  Laterality: N/A;   COLONOSCOPY     COLONOSCOPY W/ BIOPSIES AND POLYPECTOMY  2018   ESOPHAGOGASTRODUODENOSCOPY (EGD) WITH PROPOFOL N/A 12/16/2020   Procedure: ESOPHAGOGASTRODUODENOSCOPY (EGD) WITH PROPOFOL;  Surgeon: SLadene Artist MD;  [place] [facility]  Service: Endoscopy;  Laterality: N/A;   FOOT SURGERY Bilateral    THaines"bunion,bone spur, tendon" (1) -6'16, (1)-10'16   HEMATOMA EVACUATION N/A 01/05/2022   Procedure: Cervical Wound Exploration;  Surgeon: CAshok Pall MD;  [place] [facility]  Service: Neurosurgery;  Laterality: N/A;   IR EPIDUROGRAPHY  07/21/2018   LUMBAR LAMINECTOMY/DECOMPRESSION MICRODISCECTOMY Bilateral 12/28/2015   Procedure: MICRO LUMBAR DECOMPRESSION L4 - L5 BILATERALLY;  Surgeon: JSusa Day MD;  Location: [facility];  Service: Orthopedics;  Laterality: Bilateral;   LUMBAR LAMINECTOMY/DECOMPRESSION MICRODISCECTOMY Bilateral 03/04/2018   Procedure: Revision of Microlumbar Decompression  Bilateral Lumbar Four-Five;  Surgeon: [name], MD;  Location: [place];  Service: Orthopedics;  Laterality: Bilateral;  90 mins   SAVORY DILATION N/A 12/16/2020   Procedure: SAVORY DILATION;  Surgeon: Ladene Artist, MD;  Location: [facility];  Service: Endoscopy;  Laterality: N/A;   SPINAL CORD STIMULATOR INSERTION N/A 09/28/2019   Procedure: THORACIC SPINAL CORD STIMULATOR INSERTION;  Surgeon: Melina Schools, MD;  Location: [place];  Service: Orthopedics;  Laterality: N/A;  2.5 hrs   SPINAL CORD STIMULATOR REMOVAL N/A 05/27/2021   Procedure: LUMBAR SPINAL CORD STIMULATOR REMOVAL;  Surgeon: Deetta Perla, MD;  Location: [facility];  Service: Neurosurgery;  Laterality: N/A;   TUBAL LIGATION     WISDOM TOOTH EXTRACTION     WOUND EXPLORATION N/A 03/04/2018   Procedure: EXPLORATION OF LUMBAR DECOMPRESSION WOUND;  Surgeon: [name], MD;  Location: [place];  Service: Orthopedics;  Laterality: N/A;   Family History  Problem Relation Age of Onset    Heart attack Mother    Lung cancer Father    Cancer Father    Pancreatic cancer Sister    Breast cancer Sister 53   Multiple myeloma Sister    Breast cancer Sister        diagnosed in her 9's   Heart attack Sister    Throat cancer Brother    Lung cancer Brother    Stomach cancer Cousin    Colon cancer Neg Hx    Colon polyps Neg Hx    Esophageal cancer Neg Hx    Rectal cancer Neg Hx    Social History   Socioeconomic History   Marital status: Married    Spouse name: Nelida Gores   Number of children: 1   Years of education: Not on file   Highest education level: Not on file  Occupational History   Not on file  Tobacco Use   Smoking status: Never   Smokeless tobacco: Never  Vaping Use   Vaping Use: Never used  Substance and Sexual Activity   Alcohol use: Never   Drug use: Never   Sexual activity: Yes    Partners: Male    Birth control/protection: Post-menopausal, Surgical  Other Topics Concern   Not on file  Social History Narrative   Not on file   Social Determinants of Health   Financial Resource Strain: Low Risk  (09/29/2022)   Overall Financial Resource Strain (CARDIA)    Difficulty of Paying Living Expenses: Not hard at all  Food Insecurity: No Food Insecurity (09/29/2022)   Hunger Vital Sign    Worried About Running Out of Food in the Last Year: Never true    Ran Out of Food in the Last Year: Never true  Transportation Needs: No Transportation Needs (09/29/2022)   PRAPARE - Hydrologist (Medical): No    Lack of Transportation (Non-Medical): No  Physical Activity: Inactive (09/29/2022)   Exercise Vital Sign    Days of Exercise per Week: 0 days    Minutes of Exercise per Session: 0 min  Stress: No Stress Concern Present (09/29/2022)   [place]    Feeling of Stress : Not at all  Social Connections: [place] (09/29/2022)   Social Connection and  Isolation Panel [NHANES]    Frequency of Communication with Friends and Family: More than three times a week    Frequency of Social Gatherings with Friends and Family: More than three times a week  Attends Religious Services: More than 4 times per year    Active Member of Clubs or Organizations: Yes    Attends Music therapist: More than 4 times per year    Marital Status: Married    Tobacco Counseling Counseling given: Not Answered   Clinical Intake:  Pre-visit preparation completed: No  Pain : No/denies pain     BMI - recorded: 28.46 Nutritional Risks: None Diabetes: No  How often do you need to have someone help you when you read instructions, pamphlets, or other written materials from your doctor or pharmacy?: 1 - Never  Diabetic?  No  Interpreter Needed?: No  Information entered by :: Rolene Arbour LPN   Activities of Daily Living    09/29/2022   11:39 AM 01/04/2022    3:48 AM  In your present state of health, do you have any difficulty performing the following activities:  Hearing? 0 0  Vision? 0 0  Difficulty concentrating or making decisions? 0 0  Walking or climbing stairs? 0 1  Dressing or bathing? 0 0  Doing errands, shopping? 0 1  Preparing Food and eating ? N   Using the Toilet? N   In the past six months, have you accidently leaked urine? N   Do you have problems with loss of bowel control? N   Managing your Medications? N   Managing your Finances? N   Housekeeping or managing your Housekeeping? N     Patient Care Team: [name], [name], MD as PCP - General (Family Medicine) Debara Pickett Nadean Corwin, MD as PCP - Cardiology (Cardiology) [name], MD as Consulting Physician (Orthopedic Surgery) Viona Gilmore, [facility] as Pharmacist (Pharmacist)  Indicate any recent Medical Services you may have received from other than [facility] providers in the past year (date may be approximate).     Assessment:   This is a routine wellness  examination for [place].  Hearing/Vision screen Hearing Screening - Comments:: Denies hearing difficulties   Vision Screening - Comments:: Wears reading glasses - up to date with routine eye exams with  Dr Gershon Crane  Dietary issues and exercise activities discussed: Exercise limited by: None identified   Goals Addressed               This Visit's Progress     Stay Healthy (pt-stated)         Depression Screen    09/29/2022   11:37 AM 09/29/2022    9:01 AM 04/07/2022    8:38 AM 11/27/2021    9:50 AM 10/04/2021    8:35 AM 09/12/2021   11:26 AM 07/31/2021   10:09 AM  PHQ 2/9 Scores  PHQ - 2 Score 0 2 1 0 0 0 0  PHQ- 9 Score 0 3 4  0      Fall Risk    09/29/2022   11:39 AM 09/29/2022    9:02 AM 06/25/2022    9:39 AM 06/17/2022    2:55 PM 04/07/2022    8:39 AM  Fall Risk   Falls in the past year? 1 0 _0 Number falls in past yr: 0 0 1 0 1  Injury with Fall? 0 0 0 1 0  Comment Bruising. No medical attention needed      Risk for fall due to : No Fall Risks Impaired balance/gait  No Fall Risks Impaired balance/gait  Follow up Falls prevention discussed Falls evaluation completed  Falls evaluation completed Falls evaluation completed    FALL RISK  PREVENTION PERTAINING TO THE HOME:  Any stairs in or around the home? Yes  If so, are there any without handrails? No  Home free of loose throw rugs in walkways, pet beds, electrical cords, etc? Yes  Adequate lighting in your home to reduce risk of falls? Yes   ASSISTIVE DEVICES UTILIZED TO PREVENT FALLS:  Life alert? No  Use of a cane, walker or w/c? Yes  Grab bars in the bathroom? Yes  Shower chair or bench in shower? Yes  Elevated toilet seat or a handicapped toilet? Yes   TIMED UP AND GO:  Was the test performed? No . Audio Visit    Cognitive Function:        09/29/2022   11:41 AM 09/12/2021   11:40 AM  6CIT Screen  What Year? 0 points 0 points  What month? 0 points 0 points  What time? 0 points 0 points   Count back from 20 0 points 0 points  Months in reverse 0 points 0 points  Repeat phrase 0 points   Total Score 0 points     Immunizations Immunization History  Administered Date(s) Administered   Influenza,inj,Quad PF,6+ Mos 08/03/2018, 08/09/2019, 08/20/2020, 07/04/2021, 07/21/2022   Influenza,inj,quad, With Preservative 07/27/2017   Influenza-Unspecified 08/07/2021   PFIZER(Purple Top)SARS-COV-2 Vaccination 12/14/2019, 01/05/2020, 07/24/2020, 02/15/2021   Pfizer Covid-19 Vaccine Bivalent Booster 76yr & up 07/30/2022   Td 10/28/2007   Tdap 10/27/2012, 06/09/2014   Unspecified SARS-COV-2 Vaccination 08/28/2021   Zoster Recombinat (Shingrix) 08/27/2021, 03/12/2022    TDAP status: Up to date  Flu Vaccine status: Up to date    Covid-19 vaccine status: Completed vaccines  Qualifies for Shingles Vaccine? Yes   Zostavax completed Yes   Shingrix Completed?: Yes  Screening Tests Health Maintenance  Topic Date Due   COVID-19 Vaccine (7 - 2023-24 season) 10/15/2022 (Originally 09/24/2022)   COLONOSCOPY (Pts 45-48yrInsurance coverage will need to be confirmed)  10/01/2023 (Originally 09/04/2022)   MAMMOGRAM  06/29/2023   Medicare Annual Wellness (AWV)  09/30/2023   DTaP/Tdap/Td (4 - Td or Tdap) 06/09/2024   INFLUENZA VACCINE  Completed   Hepatitis C Screening  Completed   HIV Screening  Completed   Zoster Vaccines- Shingrix  Completed   HPV VACCINES  Aged Out   PAP SMEAR-Modifier  Discontinued    Health Maintenance  There are no preventive care reminders to display for this patient.   Colorectal cancer screening: Referral to GI placed Scheduled for 09/30/22. Pt aware the office will call re: appt.  Mammogram status: Ordered Scheduled for 10/14/22. Pt provided with contact info and advised to call to schedule appt.     Lung Cancer Screening: (Low Dose CT Chest recommended if Age 61-80ears, 30 pack-year currently smoking OR have quit w/in 15years.) does not  qualify.     Additional Screening:  Hepatitis C Screening: does qualify; Completed 04/20/20  Vision Screening: Recommended annual ophthalmology exams for early detection of glaucoma and other disorders of the eye. Is the patient up to date with their annual eye exam?  Yes  Who is the provider or what is the name of the office in which the patient attends annual eye exams? Dr ShGershon Cranef pt is not established with a provider, would they like to be referred to a provider to establish care? No .   Dental Screening: Recommended annual dental exams for proper oral hygiene  Community Resource Referral / Chronic Care Management:  CRR required this visit?  No   CCM required  this visit?  No      Plan:     I have personally reviewed and noted the following in the patient's chart:   Medical and social history Use of alcohol, tobacco or illicit drugs  Current medications and supplements including opioid prescriptions. Patient is currently taking opioid prescriptions. Information provided to patient regarding non-opioid alternatives. Patient advised to discuss non-opioid treatment plan with their provider. Functional ability and status Nutritional status Physical activity Advanced directives List of other physicians Hospitalizations, surgeries, and ER visits in previous 12 months Vitals Screenings to include cognitive, depression, and falls Referrals and appointments  In addition, I have reviewed and discussed with patient certain preventive protocols, quality metrics, and best practice recommendations. A written personalized care plan for preventive services as well as general preventive health recommendations were provided to patient.     Criselda Peaches, LPN   29/01/7653   Nurse Notes: None

## 2022-09-29 NOTE — Assessment & Plan Note (Addendum)
Intermittent. ? Musculoskeletal. Today tenderness with palpation of costochondral joints, left side. Coronary calcium score 0 in 03/2021. Cardiac monitor negative for significant arrhythmia. We discussed possible etiologies, anxiety can certainly be a contributing factor but she needs to arrange f/u appt if problem becomes more frequent or intense. Clearly instructed about warning signs.

## 2022-09-29 NOTE — Assessment & Plan Note (Deleted)
Intermittent chest tightness. Coronary calcium score 0 in 03/2021. Cardiac monitor negative for significant arrhythmia. We discussed possible etiologies, anxiety can certainly be a contributing factor but she needs to arrange f/u appt if problem becomes more frequent or intense. Clearly instructed about warning signs.

## 2022-09-29 NOTE — Patient Instructions (Addendum)
[name] , Thank you for taking time to come for your Medicare Wellness Visit. I appreciate your ongoing commitment to your health goals. Please review the following plan we discussed and let me know if I can assist you in the future.   These are the goals we discussed:  Goals       Increase physical activity      Patient wants to increase ability to walk more.      Stay Healthy (pt-stated)        This is a list of the screening recommended for you and due dates:  Health Maintenance  Topic Date Due   COVID-19 Vaccine (7 - 2023-24 season) 10/15/2022*   Colon Cancer Screening  10/01/2023*   Mammogram  06/29/2023   Medicare Annual Wellness Visit  09/30/2023   DTaP/Tdap/Td vaccine (4 - Td or Tdap) 06/09/2024   Flu Shot  Completed   Hepatitis C Screening: USPSTF Recommendation to screen - Ages 18-79 yo.  Completed   HIV Screening  Completed   Zoster (Shingles) Vaccine  Completed   HPV Vaccine  Aged Out   Pap Smear  Discontinued  *Topic was postponed. The date shown is not the original due date.   Opioid Pain Medicine Management Opioids are powerful medicines that are used to treat moderate to severe pain. When used for short periods of time, they can help you to: Sleep better. Do better in physical or occupational therapy. Feel better in the first few days after an injury. Recover from surgery. Opioids should be taken with the supervision of a trained health care provider. They should be taken for the shortest period of time possible. This is because opioids can be addictive, and the longer you take opioids, the greater your risk of addiction. This addiction can also be called opioid use disorder. What are the risks? Using opioid pain medicines for longer than 3 days increases your risk of side effects. Side effects include: Constipation. Nausea and vomiting. Breathing difficulties (respiratory depression). Drowsiness. Confusion. Opioid use disorder. Itching. Taking  opioid pain medicine for a long period of time can affect your ability to do daily tasks. It also puts you at risk for: Motor vehicle crashes. Depression. Suicide. Heart attack. Overdose, which can be life-threatening. What is a pain treatment plan? A pain treatment plan is an agreement between you and your health care provider. Pain is unique to each person, and treatments vary depending on your condition. To manage your pain, you and your health care provider need to work together. To help you do this: Discuss the goals of your treatment, including how much pain you might expect to have and how you will manage the pain. Review the risks and benefits of taking opioid medicines. Remember that a good treatment plan uses more than one approach and minimizes the chance of side effects. Be honest about the amount of medicines you take and about any drug or alcohol use. Get pain medicine prescriptions from only one health care provider. Pain can be managed with many types of alternative treatments. Ask your health care provider to refer you to one or more specialists who can help you manage pain through: Physical or occupational therapy. Counseling (cognitive behavioral therapy). Good nutrition. Biofeedback. Massage. Meditation. Non-opioid medicine. Following a gentle exercise program. How to use opioid pain medicine Taking medicine Take your pain medicine exactly as told by your health care provider. Take it only when you need it. If your pain gets less severe, you may  take less than your prescribed dose if your health care provider approves. If you are not having pain, do nottake pain medicine unless your health care provider tells you to take it. If your pain is severe, do nottry to treat it yourself by taking more pills than instructed on your prescription. Contact your health care provider for help. Write down the times when you take your pain medicine. It is easy to become confused while  on pain medicine. Writing the time can help you avoid overdose. Take other over-the-counter or prescription medicines only as told by your health care provider. Keeping yourself and others safe  While you are taking opioid pain medicine: Do not drive, use machinery, or power tools. Do not sign legal documents. Do not drink alcohol. Do not take sleeping pills. Do not supervise children by yourself. Do not do activities that require climbing or being in high places. Do not go to a lake, river, ocean, spa, or swimming pool. Do not share your pain medicine with anyone. Keep pain medicine in a locked cabinet or in a secure area where pets and children cannot reach it. Stopping your use of opioids If you have been taking opioid medicine for more than a few weeks, you may need to slowly decrease (taper) how much you take until you stop completely. Tapering your use of opioids can decrease your risk of symptoms of withdrawal, such as: Pain and cramping in the abdomen. Nausea. Sweating. Sleepiness. Restlessness. Uncontrollable shaking (tremors). Cravings for the medicine. Do not attempt to taper your use of opioids on your own. Talk with your health care provider about how to do this. Your health care provider may prescribe a step-down schedule based on how much medicine you are taking and how long you have been taking it. Getting rid of leftover pills Do not save any leftover pills. Get rid of leftover pills safely by: Taking the medicine to a prescription take-back program. This is usually offered by the county or law enforcement. Bringing them to a pharmacy that has a drug disposal container. Flushing them down the toilet. Check the label or package insert of your medicine to see whether this is safe to do. Throwing them out in the trash. Check the label or package insert of your medicine to see whether this is safe to do. If it is safe to throw it out, remove the medicine from the original  container, put it into a sealable bag or container, and mix it with used coffee grounds, food scraps, dirt, or cat litter before putting it in the trash. Follow these instructions at home: Activity Do exercises as told by your health care provider. Avoid activities that make your pain worse. Return to your normal activities as told by your health care provider. Ask your health care provider what activities are safe for you. General instructions You may need to take these actions to prevent or treat constipation: Drink enough fluid to keep your urine pale yellow. Take over-the-counter or prescription medicines. Eat foods that are high in fiber, such as beans, whole grains, and fresh fruits and vegetables. Limit foods that are high in fat and processed sugars, such as fried or sweet foods. Keep all follow-up visits. This is important. Where to find support If you have been taking opioids for a long time, you may benefit from receiving support for quitting from a local support group or counselor. Ask your health care provider for a referral to these resources in your area. Where to  find more information Centers for Disease Control and Prevention (CDC): http://www.wolf.info/ [place] Food and Drug Administration (FDA): [place] Get help right away if: You may have taken too much of an opioid (overdosed). Common symptoms of an overdose: Your breathing is slower or more shallow than normal. You have a very slow heartbeat (pulse). You have slurred speech. You have nausea and vomiting. Your pupils become very small. You have other potential symptoms: You are very confused. You faint or feel like you will faint. You have cold, clammy skin. You have blue lips or fingernails. You have thoughts of harming yourself or harming others. These symptoms may represent a serious problem that is an emergency. Do not wait to see if the symptoms will go away. Get medical help right away. Call your local emergency  services (911 in the [place] Do not drive yourself to the hospital.  If you ever feel like you may hurt yourself or others, or have thoughts about taking your own life, get help right away. Go to your nearest emergency department or: Call your local emergency services (911 in the [place] Call the [facility] 667-711-1228 in the [place] Call a suicide crisis helpline, such as the [place] at 605-332-1783 or 988 in the [place]. This is open 24 hours a day in the [place] Text the Crisis Text Line at 364-855-4628 (in the [place].). Summary Opioid medicines can help you manage moderate to severe pain for a short period of time. A pain treatment plan is an agreement between you and your health care provider. Discuss the goals of your treatment, including how much pain you might expect to have and how you will manage the pain. If you think that you or someone else may have taken too much of an opioid, get medical help right away. This information is not intended to replace advice given to you by your health care provider. Make sure you discuss any questions you have with your health care provider. Document Revised: 05/08/2021 Document Reviewed: 01/23/2021 Elsevier Patient Education  [place] directives: Advance directive discussed with you today. Even though you declined this today, please call our office should you change your mind, and we can give you the proper paperwork for you to fill out.   Conditions/risks identified: None  Next appointment: Follow up in one year for your annual wellness visit.   Preventive Care 40-64 Years, Female Preventive care refers to lifestyle choices and visits with your health care provider that can promote health and wellness. What does preventive care include? A yearly physical exam. This is also called an annual well check. Dental exams once or twice a year. Routine eye exams. Ask your health care provider how  often you should have your eyes checked. Personal lifestyle choices, including: Daily care of your teeth and gums. Regular physical activity. Eating a healthy diet. Avoiding tobacco and drug use. Limiting alcohol use. Practicing safe sex. Taking low-dose aspirin daily starting at age 68. Taking vitamin and mineral supplements as recommended by your health care provider. What happens during an annual well check? The services and screenings done by your health care provider during your annual well check will depend on your age, overall health, lifestyle risk factors, and family history of disease. Counseling  Your health care provider may ask you questions about your: Alcohol use. Tobacco use. Drug use. Emotional well-being. Home and relationship well-being. Sexual activity. Eating habits. Work and work Statistician. Method of birth control. Menstrual cycle.  Pregnancy history. Screening  You may have the following tests or measurements: Height, weight, and BMI. Blood pressure. Lipid and cholesterol levels. These may be checked every 5 years, or more frequently if you are over 65 years old. Skin check. Lung cancer screening. You may have this screening every year starting at age 42 if you have a 30-pack-year history of smoking and currently smoke or have quit within the past 15 years. Fecal occult blood test (FOBT) of the stool. You may have this test every year starting at age 60. Flexible sigmoidoscopy or colonoscopy. You may have a sigmoidoscopy every 5 years or a colonoscopy every 10 years starting at age 2. Hepatitis C blood test. Hepatitis B blood test. Sexually transmitted disease (STD) testing. Diabetes screening. This is done by checking your blood sugar (glucose) after you have not eaten for a while (fasting). You may have this done every 1-3 years. Mammogram. This may be done every 1-2 years. Talk to your health care provider about when you should start having regular  mammograms. This may depend on whether you have a family history of breast cancer. BRCA-related cancer screening. This may be done if you have a family history of breast, ovarian, tubal, or peritoneal cancers. Pelvic exam and Pap test. This may be done every 3 years starting at age 71. Starting at age 12, this may be done every 5 years if you have a Pap test in combination with an HPV test. Bone density scan. This is done to screen for osteoporosis. You may have this scan if you are at high risk for osteoporosis. Discuss your test results, treatment options, and if necessary, the need for more tests with your health care provider. Vaccines  Your health care provider may recommend certain vaccines, such as: Influenza vaccine. This is recommended every year. Tetanus, diphtheria, and acellular pertussis (Tdap, Td) vaccine. You may need a Td booster every 10 years. Zoster vaccine. You may need this after age 57. Pneumococcal 13-valent conjugate (PCV13) vaccine. You may need this if you have certain conditions and were not previously vaccinated. Pneumococcal polysaccharide (PPSV23) vaccine. You may need one or two doses if you smoke cigarettes or if you have certain conditions. Talk to your health care provider about which screenings and vaccines you need and how often you need them. This information is not intended to replace advice given to you by your health care provider. Make sure you discuss any questions you have with your health care provider. Document Released: 11/09/2015 Document Revised: 07/02/2016 Document Reviewed: 08/14/2015 Elsevier Interactive Patient Education  2017 [place] Prevention in the Home Falls can cause injuries. They can happen to people of all ages. There are many things you can do to make your home safe and to help prevent falls. What can I do on the outside of my home? Regularly fix the edges of walkways and driveways and fix any cracks. Remove anything  that might make you trip as you walk through a door, such as a raised step or threshold. Trim any bushes or trees on the path to your home. Use bright outdoor lighting. Clear any walking paths of anything that might make someone trip, such as rocks or tools. Regularly check to see if handrails are loose or broken. Make sure that both sides of any steps have handrails. Any raised decks and porches should have guardrails on the edges. Have any leaves, snow, or ice cleared regularly. Use sand or salt on walking paths  during winter. Clean up any spills in your garage right away. This includes oil or grease spills. What can I do in the bathroom? Use night lights. Install grab bars by the toilet and in the tub and shower. Do not use towel bars as grab bars. Use non-skid mats or decals in the tub or shower. If you need to sit down in the shower, use a plastic, non-slip stool. Keep the floor dry. Clean up any water that spills on the floor as soon as it happens. Remove soap buildup in the tub or shower regularly. Attach bath mats securely with double-sided non-slip rug tape. Do not have throw rugs and other things on the floor that can make you trip. What can I do in the bedroom? Use night lights. Make sure that you have a light by your bed that is easy to reach. Do not use any sheets or blankets that are too big for your bed. They should not hang down onto the floor. Have a firm chair that has side arms. You can use this for support while you get dressed. Do not have throw rugs and other things on the floor that can make you trip. What can I do in the kitchen? Clean up any spills right away. Avoid walking on wet floors. Keep items that you use a lot in easy-to-reach places. If you need to reach something above you, use a strong step stool that has a grab bar. Keep electrical cords out of the way. Do not use floor polish or wax that makes floors slippery. If you must use wax, use non-skid floor  wax. Do not have throw rugs and other things on the floor that can make you trip. What can I do with my stairs? Do not leave any items on the stairs. Make sure that there are handrails on both sides of the stairs and use them. Fix handrails that are broken or loose. Make sure that handrails are as long as the stairways. Check any carpeting to make sure that it is firmly attached to the stairs. Fix any carpet that is loose or worn. Avoid having throw rugs at the top or bottom of the stairs. If you do have throw rugs, attach them to the floor with carpet tape. Make sure that you have a light switch at the top of the stairs and the bottom of the stairs. If you do not have them, ask someone to add them for you. What else can I do to help prevent falls? Wear shoes that: Do not have high heels. Have rubber bottoms. Are comfortable and fit you well. Are closed at the toe. Do not wear sandals. If you use a stepladder: Make sure that it is fully opened. Do not climb a closed stepladder. Make sure that both sides of the stepladder are locked into place. Ask someone to hold it for you, if possible. Clearly mark and make sure that you can see: Any grab bars or handrails. First and last steps. Where the edge of each step is. Use tools that help you move around (mobility aids) if they are needed. These include: Canes. Walkers. Scooters. Crutches. Turn on the lights when you go into a dark area. Replace any light bulbs as soon as they burn out. Set up your furniture so you have a clear path. Avoid moving your furniture around. If any of your floors are uneven, fix them. If there are any pets around you, be aware of where they are. Review your  medicines with your doctor. Some medicines can make you feel dizzy. This can increase your chance of falling. Ask your doctor what other things that you can do to help prevent falls. This information is not intended to replace advice given to you by your  health care provider. Make sure you discuss any questions you have with your health care provider. Document Released: 08/09/2009 Document Revised: 03/20/2016 Document Reviewed: 11/17/2014 Elsevier Interactive Patient Education  2017 [employer].

## 2022-09-29 NOTE — Assessment & Plan Note (Signed)
We discussed possible etiologies.  ? Cervical radiculopathy. Cervical MRI done in 04/2019:Multilevel cervical spondylosis, most pronounced at the C5-6 level where there is moderate canal stenosis and mild bilateral foraminal stenosis. Instructed to arrange follow up appt with neurosurgeon.

## 2022-09-29 NOTE — Assessment & Plan Note (Signed)
It has been stable sine 04/2022. We discussed Dx criteria for CKD. Continue adequate hydration, low salt diet,and avoidance of NSAID's. Continue Losartan 100 mg daily.

## 2022-09-29 NOTE — Assessment & Plan Note (Signed)
LDL 116. Continue Atorvastatin 20 mg daily and low fat diet.

## 2022-09-30 ENCOUNTER — Encounter: Payer: Self-pay | Admitting: Gastroenterology

## 2022-09-30 ENCOUNTER — Ambulatory Visit (AMBULATORY_SURGERY_CENTER): Payer: PPO | Admitting: Gastroenterology

## 2022-09-30 VITALS — BP 130/70 | HR 58 | Temp 97.9°F | Resp 10 | Ht 65.0 in | Wt 168.0 lb

## 2022-09-30 DIAGNOSIS — D123 Benign neoplasm of transverse colon: Secondary | ICD-10-CM

## 2022-09-30 DIAGNOSIS — D125 Benign neoplasm of sigmoid colon: Secondary | ICD-10-CM | POA: Diagnosis not present

## 2022-09-30 DIAGNOSIS — Z86010 Personal history of colonic polyps: Secondary | ICD-10-CM | POA: Diagnosis not present

## 2022-09-30 DIAGNOSIS — Z09 Encounter for follow-up examination after completed treatment for conditions other than malignant neoplasm: Secondary | ICD-10-CM | POA: Diagnosis not present

## 2022-09-30 MED ORDER — SODIUM CHLORIDE 0.9 % IV SOLN: 500.0000 mL | Freq: Once | INTRAVENOUS | Status: DC

## 2022-09-30 NOTE — Progress Notes (Signed)
History and Physical:  This patient presents for endoscopic testing for: Encounter Diagnosis  Name Primary?   Personal history of colonic polyps Yes    61 year old woman here for surveillance colonoscopy.  Diminutive tubular adenoma on last colonoscopy November 2018.  Diminutive tubular adenoma in 2012. Patient denies chronic abdominal pain, rectal bleeding, constipation or diarrhea.   Patient is otherwise without complaints or active issues today.   Past Medical History: Past Medical History:  Diagnosis Date   Anxiety    Aortic atherosclerosis ([place]) 04/16/2021   Atypical angina    Back pain    related to spinal stenosis and disc problem, radiates down left buttocks to leg., weakness occ.   Chest pain    a. 03/2015 Cath: nl cors; b. 03/2021 Cor CTA: Ca2+ = 0. Nl Cors.   Chronic pain syndrome    Dyspnea    GERD (gastroesophageal reflux disease)    Grade I diastolic dysfunction    Headache    Hyperlipidemia    Hypertension    Lumbar post-laminectomy syndrome    LVH (left ventricular hypertrophy) 12/15/2020   a. 11/2020 Echo: EF 65-70%, no rwma, sev asymm LVH with IVSd 1.9 cm. No LVOT obs @ rest. Gr1 DD. Triv MR.   PONV (postoperative nausea and vomiting)    Pulmonary nodules    a. 03/2021 CT Chest: 29m pulm nodules in bilat lower lobes. F/u 1 yr.   Right foot drop    Syncope    a. 11/2020 Zio: No significant arrhythmias.   Vaginal foreign object    "Uses Femring"     Past Surgical History: Past Surgical History:  Procedure Laterality Date   ABDOMINAL HYSTERECTOMY     ANTERIOR CERVICAL DECOMP/DISCECTOMY FUSION N/A 01/03/2022   Procedure: Anterior Cervical Decompression Fusion - Cervical four-Cervical five - Cervical five-Cervical six;  Surgeon: JEustace Moore MD;  [place] [facility]  Service: Neurosurgery;  Laterality: N/A;   BIOPSY  12/16/2020   Procedure: BIOPSY;  Surgeon: SLadene Artist MD;  [place] [facility]  Service: Endoscopy;;   CARDIAC CATHETERIZATION N/A  04/18/2015   Procedure: Left Heart Cath and Coronary Angiography;  Surgeon: MCharolette Forward MD;  [facility];  Service: Cardiovascular;  Laterality: N/A;   COLONOSCOPY     COLONOSCOPY W/ BIOPSIES AND POLYPECTOMY  2018   ESOPHAGOGASTRODUODENOSCOPY (EGD) WITH PROPOFOL N/A 12/16/2020   Procedure: ESOPHAGOGASTRODUODENOSCOPY (EGD) WITH PROPOFOL;  Surgeon: SLadene Artist MD;  [place] [facility]  Service: Endoscopy;  Laterality: N/A;   FOOT SURGERY Bilateral    TO[place]"bunion,bone spur, tendon" (1) -6'16, (1)-10'16   HEMATOMA EVACUATION N/A 01/05/2022   Procedure: Cervical Wound Exploration;  Surgeon: CAshok Pall MD;  [place] [facility]  Service: Neurosurgery;  Laterality: N/A;   IR EPIDUROGRAPHY  07/21/2018   LUMBAR LAMINECTOMY/DECOMPRESSION MICRODISCECTOMY Bilateral 12/28/2015   Procedure: MICRO LUMBAR DECOMPRESSION L4 - L5 BILATERALLY;  Surgeon: JSusa Day MD;  [facility];  Service: Orthopedics;  Laterality: Bilateral;   LUMBAR LAMINECTOMY/DECOMPRESSION MICRODISCECTOMY Bilateral 03/04/2018   Procedure: Revision of Microlumbar Decompression Bilateral Lumbar Four-Five;  Surgeon: BSusa Day MD;  [place] [facility]  Service: Orthopedics;  Laterality: Bilateral;  90 mins   SAVORY DILATION N/A 12/16/2020   Procedure: SAVORY DILATION;  Surgeon: SLadene Artist MD;  [facility];  Service: Endoscopy;  Laterality: N/A;   SPINAL CORD STIMULATOR INSERTION N/A 09/28/2019   Procedure: THORACIC SPINAL CORD STIMULATOR INSERTION;  Surgeon: BMelina Schools MD;  [place] [facility]  Service: Orthopedics;  Laterality: N/A;  2.5 hrs   SPINAL CORD STIMULATOR REMOVAL N/A 05/27/2021   Procedure: LUMBAR SPINAL CORD STIMULATOR REMOVAL;  Surgeon: Deetta Perla, MD;  Location: [facility];  Service: Neurosurgery;  Laterality: N/A;   TUBAL LIGATION     WISDOM TOOTH EXTRACTION     WOUND EXPLORATION N/A 03/04/2018   Procedure: EXPLORATION OF LUMBAR DECOMPRESSION WOUND;   Surgeon: [name], MD;  Location: [place];  Service: Orthopedics;  Laterality: N/A;    Allergies: Allergies  Allergen Reactions   Cephalosporins Anaphylaxis   Penicillins Anaphylaxis and Hives    Did it involve swelling of the face/tongue/throat, SOB, or low BP? Yes Did it involve sudden or severe rash/hives, skin peeling, or any reaction on the inside of your mouth or nose? Yes Did you need to seek medical attention at a hospital or doctor's office? Yes When did it last happen?      10+ years If all above answers are "NO", may proceed with cephalosporin use.     Anesthetics, Amide Nausea And Vomiting    Does not know name of it. States they put it on record foot center.    Betadine [Povidone Iodine] Other (See Comments)    "Skin Burn" caused scar on Left buttock   Latex Hives, Itching, Rash and Other (See Comments)   Peach [Prunus Persica] Hives   Claritin [Loratadine]     Rash, pruritis     Outpatient Meds: Current Outpatient Medications  Medication Sig Dispense Refill   ALPRAZolam (XANAX) 0.5 MG tablet Take 0.5-1 tablets (0.25-0.5 mg total) by mouth at bedtime as needed for anxiety. 30 tablet 2   amLODipine (NORVASC) 5 MG tablet Take 1 tablet (5 mg total) by mouth daily. 90 tablet 3   atorvastatin (LIPITOR) 20 MG tablet TAKE 1 TABLET BY MOUTH ONCE DAILY 90 tablet 2   baclofen (LIORESAL) 10 MG tablet TAKE 1 TABLET BY MOUTH THREE TIMES DAILY AS NEEDED 90 tablet 3   betamethasone dipropionate (DIPROLENE) 0.05 % ointment Apply as directed to affected area twice a day. Taper use as able as directed. 45 g 3   betamethasone dipropionate 0.05 % lotion Apply a small amount to affected area once a day 60 mL 11   carvedilol (COREG) 25 MG tablet Take 1 tablet (25 mg total) by mouth 2 (two) times daily with a meal. 180 tablet 3   cholecalciferol (VITAMIN D3) 25 MCG (1000 UNIT) tablet Take 1,000 Units by mouth daily.     diphenhydrAMINE (BENADRYL) 25 MG tablet Take 25 mg by mouth daily  as needed for itching.     gabapentin (NEURONTIN) 400 MG capsule Take 1 capsule by mouth three times daily. 90 capsule 6   hydrALAZINE (APRESOLINE) 25 MG tablet Take 1 tablet (25 mg total) by mouth in the morning and at bedtime. 180 tablet 3   methocarbamol (ROBAXIN) 500 MG tablet Take 1 tablet (500 mg total) by mouth 3 (three) times daily as needed for muscle spasms. 30 tablet 1   ondansetron (ZOFRAN) 4 MG tablet Take 1 tablet (4 mg tablet) 30 - 60 minutes each prep dose 2 tablet 0   acetaminophen (TYLENOL) 500 MG tablet Take 1,000 mg by mouth every 6 (six) hours as needed for headache.     busPIRone (BUSPAR) 5 MG tablet Take 1 tablet by mouth 2  times daily. (Patient taking differently: Take 5 mg by mouth 2 (two) times daily as needed (anxiety).) 180 tablet 1   Estradiol Acetate (FEMRING) 0.05 MG/24HR RING  USE ONE RING VAGINALLY EVERY 3 MONTHS AS DIRECTED 1 each 4   meclizine (ANTIVERT) 25 MG tablet Take 25 mg by mouth 3 (three) times daily as needed for dizziness.     neomycin-bacitracin-polymyxin (NEOSPORIN) ointment Apply 1 application topically as needed for wound care.     oxyCODONE-acetaminophen (PERCOCET) 7.5-325 MG tablet Take 1 tablet by mouth daily as needed for severe pain.     spironolactone (ALDACTONE) 25 MG tablet Take 1 tablet (25 mg total) by mouth daily. (Patient not taking: Reported on 09/30/2022) 90 tablet 3   traMADol (ULTRAM) 50 MG tablet Take 1 tablet by mouth every 6 (six) hours as needed. 30 tablet 2   Current Facility-Administered Medications  Medication Dose Route Frequency Provider Last Rate Last Admin   0.9 %  sodium chloride infusion  500 mL Intravenous Once Nelida Meuse III, MD          ___________________________________________________________________ Objective   Exam:  BP (!) 147/88   Pulse 68   Temp 97.9 F (36.6 C) (Temporal)   Ht '5\' 5"'$  (1.651 m)   Wt 168 lb (76.2 kg)   SpO2 99%   BMI 27.96 kg/m   CV: regular , S1/S2 Resp: clear to  auscultation bilaterally, normal RR and effort noted GI: soft, no tenderness, with active bowel sounds.   Assessment: Encounter Diagnosis  Name Primary?   Personal history of colonic polyps Yes     Plan: Colonoscopy  The benefits and risks of the planned procedure were described in detail with the patient or (when appropriate) their health care proxy.  Risks were outlined as including, but not limited to, bleeding, infection, perforation, adverse medication reaction leading to cardiac or pulmonary decompensation, pancreatitis (if ERCP).  The limitation of incomplete mucosal visualization was also discussed.  No guarantees or warranties were given.    The patient is appropriate for an endoscopic procedure in the ambulatory setting.   - Wilfrid Lund, MD

## 2022-09-30 NOTE — Progress Notes (Signed)
Pt's states no medical or surgical changes since previsit or office visit. 

## 2022-09-30 NOTE — Progress Notes (Signed)
PT taken to PACU. Monitors in place. VSS. Report given to RN. 

## 2022-09-30 NOTE — Patient Instructions (Addendum)
- Patient has a contact number available for emergencies. The signs and symptoms of potential delayed complications were discussed with the patient. Return to normal activities tomorrow. Written discharge instructions were provided to the patient. - Resume previous diet. - Continue present medications. - Await pathology results. - Repeat colonoscopy is recommended for surveillance. The colonoscopy date will be determined after pathology results from today's exam become available for review.  Handouts on polyps and diverticulosis given.  YOU HAD AN ENDOSCOPIC PROCEDURE TODAY AT [facility]:   Refer to the procedure report that was given to you for any specific questions about what was found during the examination.  If the procedure report does not answer your questions, please call your gastroenterologist to clarify.  If you requested that your care partner not be given the details of your procedure findings, then the procedure report has been included in a sealed envelope for you to review at your convenience later.  YOU SHOULD EXPECT: Some feelings of bloating in the abdomen. Passage of more gas than usual.  Walking can help get rid of the air that was put into your GI tract during the procedure and reduce the bloating. If you had a lower endoscopy (such as a colonoscopy or flexible sigmoidoscopy) you may notice spotting of blood in your stool or on the toilet paper. If you underwent a bowel prep for your procedure, you may not have a normal bowel movement for a few days.  Please Note:  You might notice some irritation and congestion in your nose or some drainage.  This is from the oxygen used during your procedure.  There is no need for concern and it should clear up in a day or so.  SYMPTOMS TO REPORT IMMEDIATELY:  Following lower endoscopy (colonoscopy or flexible sigmoidoscopy):  Excessive amounts of blood in the stool  Significant tenderness or worsening of abdominal  pains  Swelling of the abdomen that is new, acute  Fever of 100F or higher  For urgent or emergent issues, a gastroenterologist can be reached at any hour by calling [phone]. Do not use MyChart messaging for urgent concerns.    DIET:  We do recommend a small meal at first, but then you may proceed to your regular diet.  Drink plenty of fluids but you should avoid alcoholic beverages for 24 hours.  ACTIVITY:  You should plan to take it easy for the rest of today and you should NOT DRIVE or use heavy machinery until tomorrow (because of the sedation medicines used during the test).    FOLLOW UP: Our staff will call the number listed on your records the next business day following your procedure.  We will call around 7:15- 8:00 am to check on you and address any questions or concerns that you may have regarding the information given to you following your procedure. If we do not reach you, we will leave a message.     If any biopsies were taken you will be contacted by phone or by letter within the next 1-3 weeks.  Please call us at [phone] if you have not heard about the biopsies in 3 weeks.    SIGNATURES/CONFIDENTIALITY: You and/or your care partner have signed paperwork which will be entered into your electronic medical record.  These signatures attest to the fact that that the information above on your After Visit Summary has been reviewed and is understood.  Full responsibility of the confidentiality of this discharge information lies with you  and/or your care-partner. 

## 2022-09-30 NOTE — Progress Notes (Signed)
Called to room to assist during endoscopic procedure.  Patient ID and intended procedure confirmed with present staff. Received instructions for my participation in the procedure from the performing physician.  

## 2022-09-30 NOTE — Op Note (Signed)
[facility] Patient Name: [name] Procedure Date: 09/30/2022 8:19 AM MRN: [id number] Endoscopist: Hollister. Loletha Carrow , MD, [id number] Age: 61 Referring MD:  Date of Birth: 1960-12-31 Gender: Female Account #: [id number] Procedure:                Colonoscopy Indications:              Surveillance: Personal history of adenomatous                            polyps on last colonoscopy 5 years ago                           Diminutive TA Nov 2018; Dim TA 2012 Medicines:                Monitored Anesthesia Care Procedure:                Pre-Anesthesia Assessment:                           - Prior to the procedure, a History and Physical                            was performed, and patient medications and                            allergies were reviewed. The patient's tolerance of                            previous anesthesia was also reviewed. The risks                            and benefits of the procedure and the sedation                            options and risks were discussed with the patient.                            All questions were answered, and informed consent                            was obtained. Prior Anticoagulants: The patient has                            taken no anticoagulant or antiplatelet agents. ASA                            Grade Assessment: III - A patient with severe                            systemic disease. After reviewing the risks and                            benefits, the patient was deemed in satisfactory  condition to undergo the procedure.                           After obtaining informed consent, the colonoscope                            was passed under direct vision. Throughout the                            procedure, the patient's blood pressure, pulse, and                            oxygen saturations were monitored continuously. The                            Olympus Scope [id number] was  introduced through the                            anus and advanced to the the cecum, identified by                            appendiceal orifice and ileocecal valve. The                            colonoscopy was performed without difficulty. The                            patient tolerated the procedure well. The quality                            of the bowel preparation was excellent. The                            ileocecal valve, appendiceal orifice, and rectum                            were photographed. The bowel preparation used was                            SUPREP. Scope In: 8:34:42 AM Scope Out: 8:50:37 AM Scope Withdrawal Time: 0 hours 13 minutes 2 seconds  Total Procedure Duration: 0 hours 15 minutes 55 seconds  Findings:                 The perianal and digital rectal examinations were                            normal.                           Repeat examination of right colon under NBI                            performed.  Three sessile polyps were found in the sigmoid                            colon and transverse colon. The polyps were                            diminutive in size. These polyps were removed with                            a cold snare. Resection and retrieval were complete.                           A few diverticula were found in the left colon.                           An area of melanosis was found in the entire colon.                           The exam was otherwise without abnormality on                            direct and retroflexion views. Complications:            No immediate complications. Estimated Blood Loss:     Estimated blood loss was minimal. Impression:               - Three diminutive polyps in the sigmoid colon and                            in the transverse colon, removed with a cold snare.                            Resected and retrieved.                           - Diverticulosis in the left  colon.                           - Melanosis in the colon.                           - The examination was otherwise normal on direct                            and retroflexion views. Recommendation:           - Patient has a contact number available for                            emergencies. The signs and symptoms of potential                            delayed complications were discussed with the  patient. Return to normal activities tomorrow.                            Written discharge instructions were provided to the                            patient.                           - Resume previous diet.                           - Continue present medications.                           - Await pathology results.                           - Repeat colonoscopy is recommended for                            surveillance. The colonoscopy date will be                            determined after pathology results from today's                            exam become available for review. Hilton Saephan L. Loletha Carrow, MD 09/30/2022 8:56:37 AM This report has been signed electronically.

## 2022-10-01 ENCOUNTER — Telehealth: Payer: Self-pay | Admitting: *Deleted

## 2022-10-01 NOTE — Telephone Encounter (Signed)
  Follow up Call-     09/30/2022    7:42 AM  Call back number  Post procedure Call Back phone  # [phone]  Permission to leave phone message Yes     Patient questions:  Do you have a fever, pain , or abdominal swelling? No. Pain Score  0 *  Have you tolerated food without any problems? Yes.    Have you been able to return to your normal activities? Yes.    Do you have any questions about your discharge instructions: Diet   No. Medications  No. Follow up visit  No.  Do you have questions or concerns about your Care? No.  Actions: * If pain score is 4 or above: No action needed, pain <4.

## 2022-10-02 ENCOUNTER — Encounter: Payer: Self-pay | Admitting: Gastroenterology

## 2022-10-07 ENCOUNTER — Ambulatory Visit: Payer: PPO | Admitting: Family Medicine

## 2022-10-08 ENCOUNTER — Other Ambulatory Visit: Payer: Self-pay

## 2022-10-08 ENCOUNTER — Other Ambulatory Visit (HOSPITAL_COMMUNITY): Payer: Self-pay

## 2022-10-13 DIAGNOSIS — I2089 Other forms of angina pectoris: Secondary | ICD-10-CM | POA: Diagnosis not present

## 2022-10-14 ENCOUNTER — Ambulatory Visit
Admission: RE | Admit: 2022-10-14 | Discharge: 2022-10-14 | Disposition: A | Discharge status: Home / Self Care | Payer: PPO | Source: Ambulatory Visit | Attending: Obstetrics | Admitting: Obstetrics

## 2022-10-14 ENCOUNTER — Other Ambulatory Visit (HOSPITAL_COMMUNITY): Payer: Self-pay

## 2022-10-14 ENCOUNTER — Other Ambulatory Visit: Payer: Self-pay | Admitting: Obstetrics

## 2022-10-14 DIAGNOSIS — Z1231 Encounter for screening mammogram for malignant neoplasm of breast: Secondary | ICD-10-CM

## 2022-10-14 DIAGNOSIS — R208 Other disturbances of skin sensation: Secondary | ICD-10-CM | POA: Diagnosis not present

## 2022-10-14 DIAGNOSIS — N644 Mastodynia: Secondary | ICD-10-CM

## 2022-10-14 MED ORDER — AMITRIPTYLINE HCL 25 MG PO TABS
25.0000 mg | ORAL_TABLET | Freq: Every evening | ORAL | 2 refills | Status: DC
  Filled 2022-10-14: qty 30, 30d supply, fill #0
  Filled 2022-11-12: qty 30, 30d supply, fill #1
  Filled 2022-12-13: qty 30, 30d supply, fill #2

## 2022-10-15 ENCOUNTER — Encounter: Payer: PPO | Admitting: Physical Medicine & Rehabilitation

## 2022-10-15 ENCOUNTER — Other Ambulatory Visit: Payer: Self-pay

## 2022-10-17 ENCOUNTER — Other Ambulatory Visit (HOSPITAL_COMMUNITY): Payer: Self-pay

## 2022-11-08 ENCOUNTER — Other Ambulatory Visit (HOSPITAL_COMMUNITY): Payer: Self-pay

## 2022-11-11 ENCOUNTER — Other Ambulatory Visit (HOSPITAL_COMMUNITY): Payer: Self-pay

## 2022-11-11 DIAGNOSIS — Z6828 Body mass index (BMI) 28.0-28.9, adult: Secondary | ICD-10-CM | POA: Diagnosis not present

## 2022-11-11 DIAGNOSIS — M5412 Radiculopathy, cervical region: Secondary | ICD-10-CM | POA: Diagnosis not present

## 2022-11-11 MED ORDER — METHYLPREDNISOLONE 4 MG PO TBPK
ORAL_TABLET | ORAL | 0 refills | Status: DC
  Filled 2022-11-11: qty 21, 6d supply, fill #0

## 2022-11-12 ENCOUNTER — Other Ambulatory Visit (HOSPITAL_COMMUNITY): Payer: Self-pay

## 2022-11-13 ENCOUNTER — Other Ambulatory Visit: Payer: Self-pay

## 2022-11-14 ENCOUNTER — Other Ambulatory Visit (HOSPITAL_COMMUNITY): Payer: Self-pay

## 2022-11-14 ENCOUNTER — Other Ambulatory Visit: Payer: Self-pay | Admitting: Neurological Surgery

## 2022-11-14 DIAGNOSIS — M5412 Radiculopathy, cervical region: Secondary | ICD-10-CM

## 2022-11-17 ENCOUNTER — Other Ambulatory Visit (HOSPITAL_COMMUNITY): Payer: Self-pay

## 2022-11-17 MED ORDER — DIAZEPAM 5 MG PO TABS
5.0000 mg | ORAL_TABLET | ORAL | 0 refills | Status: DC
  Filled 2022-11-17: qty 1, 1d supply, fill #0

## 2022-11-18 ENCOUNTER — Other Ambulatory Visit (HOSPITAL_COMMUNITY): Payer: Self-pay

## 2022-11-24 DIAGNOSIS — H5203 Hypermetropia, bilateral: Secondary | ICD-10-CM | POA: Diagnosis not present

## 2022-11-24 DIAGNOSIS — H524 Presbyopia: Secondary | ICD-10-CM | POA: Diagnosis not present

## 2022-11-24 DIAGNOSIS — H25813 Combined forms of age-related cataract, bilateral: Secondary | ICD-10-CM | POA: Diagnosis not present

## 2022-11-25 ENCOUNTER — Other Ambulatory Visit (HOSPITAL_COMMUNITY): Payer: Self-pay

## 2022-11-26 ENCOUNTER — Other Ambulatory Visit: Payer: Self-pay

## 2022-12-01 ENCOUNTER — Ambulatory Visit
Admission: RE | Admit: 2022-12-01 | Discharge: 2022-12-01 | Disposition: A | Discharge status: Home / Self Care | Payer: PPO | Source: Ambulatory Visit | Attending: Neurological Surgery | Admitting: Neurological Surgery

## 2022-12-01 DIAGNOSIS — M5412 Radiculopathy, cervical region: Secondary | ICD-10-CM | POA: Diagnosis not present

## 2022-12-01 DIAGNOSIS — M4802 Spinal stenosis, cervical region: Secondary | ICD-10-CM | POA: Diagnosis not present

## 2022-12-10 ENCOUNTER — Encounter: Payer: PPO | Attending: Physical Medicine & Rehabilitation | Admitting: Physical Medicine & Rehabilitation

## 2022-12-10 ENCOUNTER — Encounter: Payer: Self-pay | Admitting: Physical Medicine & Rehabilitation

## 2022-12-10 ENCOUNTER — Ambulatory Visit
Admission: RE | Admit: 2022-12-10 | Discharge: 2022-12-10 | Disposition: A | Discharge status: Home / Self Care | Payer: PPO | Source: Ambulatory Visit | Attending: Physical Medicine & Rehabilitation | Admitting: Physical Medicine & Rehabilitation

## 2022-12-10 VITALS — BP 137/84 | HR 79 | Ht 65.0 in | Wt 169.0 lb

## 2022-12-10 DIAGNOSIS — M79641 Pain in right hand: Secondary | ICD-10-CM | POA: Diagnosis not present

## 2022-12-10 DIAGNOSIS — M5416 Radiculopathy, lumbar region: Secondary | ICD-10-CM | POA: Insufficient documentation

## 2022-12-10 DIAGNOSIS — M4722 Other spondylosis with radiculopathy, cervical region: Secondary | ICD-10-CM | POA: Diagnosis not present

## 2022-12-10 NOTE — Progress Notes (Signed)
Subjective:    Patient ID: [name], female    DOB: 11-24-1960, 62 y.o.   MRN: [id number]  HPI  [name] is here in follow up of her chronic gait issues. In January she developed tingling in her right hand. She went back to Dr. Ronnald Ramp as he performed her cervical fusion last March. He ordered a NCS/EMG of her right hand.  Apparently also she was given a week of oral prednisone.  She reports that the tingling is over the entirety of the hand front and back.  She has pain along the wrist and thumb especially.  She has a hard time gripping objects.  She does report that her sister has rheumatoid arthritis.     She developed depression this past Fall after a few deaths in her family. She feels better now but as a result her exercise decreased. She feels now that her right leg is weaker.  She had her AFO repaired by [employer] and that seems to be working fairly well for her now.   Pain Inventory Average Pain 6 Pain Right Now 6 My pain is constant, stabbing, tingling, and aching  LOCATION OF PAIN  hand,fingers, back   BOWEL Number of stools per week: 10-14 Oral laxative use Yes  Type of laxative OTC    BLADDER Normal     Mobility walk without assistance use a cane use a walker ability to climb steps?  yes do you drive?  yes use a wheelchair needs help with transfers Do you have any goals in this area?  yes  Function disabled: date disabled 2022 I need assistance with the following:  bathing Do you have any goals in this area?  yes  Neuro/Psych weakness numbness tremor trouble walking dizziness anxiety  Prior Studies Any changes since last visit?  yes CT/MRI By Dr. Sherley Bounds  Physicians involved in your care Any changes since last visit?  no   Family History  Problem Relation Age of Onset   Heart attack Mother    Lung cancer Father    Cancer Father    Pancreatic cancer Sister    Breast cancer Sister 16   Multiple myeloma Sister    Breast  cancer Sister        diagnosed in her 37's   Heart attack Sister    Throat cancer Brother    Lung cancer Brother    Stomach cancer Cousin    Colon cancer Neg Hx    Colon polyps Neg Hx    Esophageal cancer Neg Hx    Rectal cancer Neg Hx    Social History   Socioeconomic History   Marital status: Married    Spouse name: Nelida Gores   Number of children: 1   Years of education: Not on file   Highest education level: Not on file  Occupational History   Not on file  Tobacco Use   Smoking status: Never   Smokeless tobacco: Never  Vaping Use   Vaping Use: Never used  Substance and Sexual Activity   Alcohol use: Never   Drug use: Never   Sexual activity: Yes    Partners: Male    Birth control/protection: Post-menopausal, Surgical  Other Topics Concern   Not on file  Social History Narrative   Not on file   Social Determinants of Health   Financial Resource Strain: Low Risk  (09/29/2022)   Overall Financial Resource Strain (CARDIA)    Difficulty of Paying Living Expenses: Not hard at all  Food Insecurity: No Food Insecurity (09/29/2022)   Hunger Vital Sign    Worried About Running Out of Food in the Last Year: Never true    Ran Out of Food in the Last Year: Never true  Transportation Needs: No Transportation Needs (09/29/2022)   PRAPARE - Hydrologist (Medical): No    Lack of Transportation (Non-Medical): No  Physical Activity: Inactive (09/29/2022)   Exercise Vital Sign    Days of Exercise per Week: 0 days    Minutes of Exercise per Session: 0 min  Stress: No Stress Concern Present (09/29/2022)   [place]    Feeling of Stress : Not at all  Social Connections: [place] (09/29/2022)   Social Connection and Isolation Panel [NHANES]    Frequency of Communication with Friends and Family: More than three times a week    Frequency of Social Gatherings with Friends and  Family: More than three times a week    Attends Religious Services: More than 4 times per year    Active Member of [employer] or Organizations: Yes    Attends Music therapist: More than 4 times per year    Marital Status: Married   Past Surgical History:  Procedure Laterality Date   ABDOMINAL HYSTERECTOMY     ANTERIOR CERVICAL DECOMP/DISCECTOMY FUSION N/A 01/03/2022   Procedure: Anterior Cervical Decompression Fusion - Cervical four-Cervical five - Cervical five-Cervical six;  Surgeon: Eustace Moore, MD;  Location: [facility];  Service: Neurosurgery;  Laterality: N/A;   BIOPSY  12/16/2020   Procedure: BIOPSY;  Surgeon: Ladene Artist, MD;  Location: [facility];  Service: Endoscopy;;   CARDIAC CATHETERIZATION N/A 04/18/2015   Procedure: Left Heart Cath and Coronary Angiography;  Surgeon: [name], MD;  Location: [facility];  Service: Cardiovascular;  Laterality: N/A;   COLONOSCOPY     COLONOSCOPY W/ BIOPSIES AND POLYPECTOMY  2018   ESOPHAGOGASTRODUODENOSCOPY (EGD) WITH PROPOFOL N/A 12/16/2020   Procedure: ESOPHAGOGASTRODUODENOSCOPY (EGD) WITH PROPOFOL;  Surgeon: Ladene Artist, MD;  Location: [facility];  Service: Endoscopy;  Laterality: N/A;   FOOT SURGERY Bilateral    [place] "bunion,bone spur, tendon" (1) -6'16, (1)-10'16   HEMATOMA EVACUATION N/A 01/05/2022   Procedure: Cervical Wound Exploration;  Surgeon: Ashok Pall, MD;  Location: [facility];  Service: Neurosurgery;  Laterality: N/A;   IR EPIDUROGRAPHY  07/21/2018   LUMBAR LAMINECTOMY/DECOMPRESSION MICRODISCECTOMY Bilateral 12/28/2015   Procedure: MICRO LUMBAR DECOMPRESSION L4 - L5 BILATERALLY;  Surgeon: [name], MD;  Location: [facility];  Service: Orthopedics;  Laterality: Bilateral;   LUMBAR LAMINECTOMY/DECOMPRESSION MICRODISCECTOMY Bilateral 03/04/2018   Procedure: Revision of Microlumbar Decompression Bilateral Lumbar Four-Five;  Surgeon: [name], MD;  Location: [facility];  Service:  Orthopedics;  Laterality: Bilateral;  90 mins   SAVORY DILATION N/A 12/16/2020   Procedure: SAVORY DILATION;  Surgeon: Ladene Artist, MD;  Location: [facility];  Service: Endoscopy;  Laterality: N/A;   SPINAL CORD STIMULATOR INSERTION N/A 09/28/2019   Procedure: THORACIC SPINAL CORD STIMULATOR INSERTION;  Surgeon: Melina Schools, MD;  Location: [place];  Service: Orthopedics;  Laterality: N/A;  2.5 hrs   SPINAL CORD STIMULATOR REMOVAL N/A 05/27/2021   Procedure: LUMBAR SPINAL CORD STIMULATOR REMOVAL;  Surgeon: Deetta Perla, MD;  Location: [facility];  Service: Neurosurgery;  Laterality: N/A;   TUBAL LIGATION     WISDOM TOOTH EXTRACTION     WOUND EXPLORATION N/A 03/04/2018   Procedure:  EXPLORATION OF LUMBAR DECOMPRESSION WOUND;  Surgeon: [name], MD;  Location: [place];  Service: Orthopedics;  Laterality: N/A;   Past Medical History:  Diagnosis Date   Anxiety    Aortic atherosclerosis ([place]) 04/16/2021   Atypical angina    Back pain    related to spinal stenosis and disc problem, radiates down left buttocks to leg., weakness occ.   Chest pain    a. 03/2015 Cath: nl cors; b. 03/2021 Cor CTA: Ca2+ = 0. Nl Cors.   Chronic pain syndrome    Dyspnea    GERD (gastroesophageal reflux disease)    Grade I diastolic dysfunction    Headache    Hyperlipidemia    Hypertension    Lumbar post-laminectomy syndrome    LVH (left ventricular hypertrophy) 12/15/2020   a. 11/2020 Echo: EF 65-70%, no rwma, sev asymm LVH with IVSd 1.9 cm. No LVOT obs @ rest. Gr1 DD. Triv MR.   PONV (postoperative nausea and vomiting)    Pulmonary nodules    a. 03/2021 CT Chest: 52m pulm nodules in bilat lower lobes. F/u 1 yr.   Right foot drop    Syncope    a. 11/2020 [facility]: No significant arrhythmias.   Vaginal foreign object    "Uses Femring"   Ht 5' 5"$  (1.651 m)   Wt 169 lb (76.7 kg)   BMI 28.12 kg/m   Opioid Risk Score:   Fall Risk Score:  `1  Depression screen PHQ 2/9     12/10/2022    1:40 PM 09/29/2022    11:37 AM 09/29/2022    9:01 AM 04/07/2022    8:38 AM 11/27/2021    9:50 AM 10/04/2021    8:35 AM 09/12/2021   11:26 AM  Depression screen PHQ 2/9  Decreased Interest 0 0 2 0 0 0 0  Down, Depressed, Hopeless 0 0 0 1 0 0 0  PHQ - 2 Score 0 0 2 1 0 0 0  Altered sleeping  0 1 1  0   Tired, decreased energy  0 0 1  0   Change in appetite  0 0 1  0   Feeling bad or failure about yourself   0 0 0  0   Trouble concentrating  0 0 0  0   Moving slowly or fidgety/restless  0 0 0  0   Suicidal thoughts  0 0 0  0   PHQ-9 Score  0 3 4  0   Difficult doing work/chores  Not difficult at all Not difficult at all Somewhat difficult       Review of Systems  Musculoskeletal:  Positive for gait problem.  Neurological:  Positive for dizziness, tremors, weakness and numbness.       TINGLING, ANXIETY  All other systems reviewed and are negative.      Objective:   Physical Exam  General: No acute distress HEENT: NCAT, EOMI, oral membranes moist Cards: reg rate  Chest: normal effort Abdomen: Soft, NT, ND Skin: dry, intact Extremities: no edema Psych: pleasant and appropriate  Skin: intact.  Surgical sites CDI Neuro: RLE 2/5 to 3/5... LLE 4/5. Sensory loss distally bilaterally.  Upper extremities are 4+ to 5 out of 5 on the right and  4 out of 5 on the left with some pain inhibition.  Improved left arm movement.  There is subjective sensory loss over the right hand. Musculoskeletal: neck rom sl reduced esp ext. Pain improved but still some tenderness with flexion, Phalen's test  is negative.  Tinel's test is negative at the wrist.  She has pain with palpation over the radial wrist as well as the metacarpal phalangeal joint of digit 1.  She has difficulty gripping due to discomfort.  No joint deformities noted     Assessment & Plan:  1. Post Lumbar Laminectomy / Decompression: Lumbar Radiculopathy:             -gabapentin continue at 340m tid             -tramadol  for breakthrough severe pain.                2. left BPPV-improved             -discussed being aware of safety, intake, etc             -acclimate before getting out of bed.  -common sense 3. Anxiety. PCP Following and prescribing Alprazolam.               -buspar, xanax prn per primary 4. Spasms: may continue robaxin 5. Orthotics:             -continue AFO 6.  Cervical spondylosis with radiculopath at C6.                       -improved after C4-5, C5-6 ACDF 7. Right hand pain: She has sensory loss over the dorsal and volar aspects of the right hand which is not consistent with carpal tunnel syndrome.  She could be sleeping in an awkward position and potentially also stretching her ulnar nerve at night.  Based on exam I think her presentation is more consistent with arthritis.  There are no joint deformities to suggest rheumatoid arthritis however.  -EMG/NCS pending  -check wrist xrays  -voltaren gel   15 minutes of face to face patient care time were spent during this visit. All questions were encouraged and answered.  Follow up with me in 3 mos .

## 2022-12-10 NOTE — Patient Instructions (Addendum)
ALWAYS FEEL FREE TO CALL OUR OFFICE WITH ANY PROBLEMS OR QUESTIONS [phone])  **PLEASE NOTE** ALL MEDICATION REFILL REQUESTS (INCLUDING CONTROLLED SUBSTANCES) NEED TO BE MADE AT LEAST 7 DAYS PRIOR TO REFILL BEING DUE. ANY REFILL REQUESTS INSIDE THAT TIME FRAME MAY RESULT IN DELAYS IN RECEIVING YOUR PRESCRIPTION.                   VOLTAREN GEL (DICLOFENAC) APPLY IT 3 X DAILY TO YOUR RIGHT HAND.

## 2022-12-12 ENCOUNTER — Encounter (HOSPITAL_COMMUNITY): Payer: Self-pay

## 2022-12-12 ENCOUNTER — Other Ambulatory Visit: Payer: Self-pay

## 2022-12-12 ENCOUNTER — Other Ambulatory Visit (HOSPITAL_BASED_OUTPATIENT_CLINIC_OR_DEPARTMENT_OTHER): Payer: Self-pay

## 2022-12-12 ENCOUNTER — Telehealth: Payer: Self-pay | Admitting: Physical Medicine & Rehabilitation

## 2022-12-12 ENCOUNTER — Other Ambulatory Visit (HOSPITAL_COMMUNITY): Payer: Self-pay

## 2022-12-12 MED ORDER — GABAPENTIN 600 MG PO TABS
600.0000 mg | ORAL_TABLET | Freq: Three times a day (TID) | ORAL | 3 refills | Status: DC
  Filled 2022-12-12 – 2022-12-20 (×2): qty 90, 30d supply, fill #0
  Filled 2023-03-30: qty 90, 30d supply, fill #1
  Filled 2023-05-16 – 2023-06-30 (×2): qty 90, 30d supply, fill #2
  Filled 2023-07-28: qty 90, 30d supply, fill #3

## 2022-12-12 NOTE — Telephone Encounter (Signed)
Patient called in requesting to know if xray results has been received and reviewed. Patient states she is still experiencing hand pain

## 2022-12-12 NOTE — Addendum Note (Signed)
Addended by: Alger Simons T on: 12/12/2022 10:58 AM   Modules accepted: Orders

## 2022-12-12 NOTE — Telephone Encounter (Signed)
I spoke with [name]. Xrays have not been read yet, but I pulled them up and don't see major joint disease. Increased gabapentin to 645m tid with short titration over weekend. New rx sent to [facility]

## 2022-12-13 ENCOUNTER — Other Ambulatory Visit (HOSPITAL_COMMUNITY): Payer: Self-pay

## 2022-12-15 ENCOUNTER — Other Ambulatory Visit (HOSPITAL_COMMUNITY): Payer: Self-pay

## 2022-12-16 ENCOUNTER — Encounter (HOSPITAL_COMMUNITY): Payer: Self-pay

## 2022-12-16 ENCOUNTER — Other Ambulatory Visit: Payer: Self-pay

## 2022-12-16 ENCOUNTER — Other Ambulatory Visit (HOSPITAL_COMMUNITY): Payer: Self-pay

## 2022-12-17 ENCOUNTER — Other Ambulatory Visit: Payer: PPO

## 2022-12-20 ENCOUNTER — Other Ambulatory Visit (HOSPITAL_COMMUNITY): Payer: Self-pay

## 2022-12-22 DIAGNOSIS — G5601 Carpal tunnel syndrome, right upper limb: Secondary | ICD-10-CM | POA: Diagnosis not present

## 2022-12-23 ENCOUNTER — Ambulatory Visit (INDEPENDENT_AMBULATORY_CARE_PROVIDER_SITE_OTHER): Payer: PPO | Admitting: Internal Medicine

## 2022-12-23 ENCOUNTER — Other Ambulatory Visit: Payer: Self-pay

## 2022-12-23 ENCOUNTER — Encounter (HOSPITAL_BASED_OUTPATIENT_CLINIC_OR_DEPARTMENT_OTHER): Payer: Self-pay | Admitting: Internal Medicine

## 2022-12-23 VITALS — BP 140/84 | HR 85 | Ht 65.0 in | Wt 175.6 lb

## 2022-12-23 DIAGNOSIS — I517 Cardiomegaly: Secondary | ICD-10-CM | POA: Diagnosis not present

## 2022-12-23 DIAGNOSIS — E269 Hyperaldosteronism, unspecified: Secondary | ICD-10-CM

## 2022-12-23 DIAGNOSIS — I10 Essential (primary) hypertension: Secondary | ICD-10-CM

## 2022-12-23 MED ORDER — SPIRONOLACTONE 50 MG PO TABS
50.0000 mg | ORAL_TABLET | Freq: Every day | ORAL | 3 refills | Status: DC
  Filled 2022-12-23: qty 90, 90d supply, fill #0
  Filled 2023-05-16: qty 90, 90d supply, fill #1
  Filled 2023-09-05: qty 90, 90d supply, fill #2

## 2022-12-23 NOTE — Addendum Note (Signed)
Addended by: Vennie Homans on: 12/23/2022 11:01 AM   Modules accepted: Orders

## 2022-12-23 NOTE — Progress Notes (Signed)
OFFICE NOTE  Chief Complaint:  Follow-up chest pain, dyspnea  Primary Care Physician: [name], [name], MD  HPI:  [name] is a 62 y.o. female with a past medial history significant for hypertension, dyslipidemia, lumbar back pain, anxiety and echo evidence of possible hypertrophic cardiomyopathy.  I had seen her in the past in the hospital but basically she has been followed by our APP's in the office until recently.  She had an echo in February which showed dynamic LVEF 65 to 70% with severe asymmetric left ventricular hypertrophy and a septal dimension of 1.9 cm.  I had recommended a cardiac MRI however this was not performed due to the fact that she has spinal stimulator.  Subsequently she had another echo 2 months later that I did not order but was interpreted as having normal septal thickness.  I personally reviewed the echo and disagree with that finding.  The septum is completely mis-measured.  It actually does measure about 2 cm.  This is consistent with prior findings.  There is family history concerning for hypertrophic cardiomyopathy.  We discussed that in more detail today.  She does have uncontrolled hypertension as well and blood pressure was quite high today 174/102.  She seems to be tolerating carvedilol which was started in the hospital and likely would benefit from more.  With regards to her spinal stimulator, her orthopedic spine surgeon recommended that she could either have the leads replaced with MRI compatible or perhaps have it removed.  She says it actually does not seem to have helped her very much and she is considering having it removed.  03/27/2021  [name] returns today for follow-up.  She continues to have concerns about chest discomfort.  We had discussed pursuing a possible cardiac MRI to evaluate for cardiomyopathy although I do not think these are causing her symptoms.  The issue is that she would have to have her spinal stimulator lead  removed and her orthospine surgeon did not want to do this.  She is sought out another opinion from and may have that performed at an outside hospital.  Blood pressure looks like it continues to be an issue and this may be actually causing a lot of her symptoms.  Of course she could have coronary disease that we have not picked up on as well.  07/10/2021  [name] returns today for follow-up.  Recently she had removal of a spinal stimulator.  She had some difficult recovery from this including a lot of back pain but it seems to be improving.  She was noted to have significant asymmetric septal hypertrophy measuring 1.9 cm by echo in February 2022.  There was no evidence of significant outflow gradient.  Subsequently she had a repeat echo in April.  The septum was measured to 0.65 cm which is surprisingly low and I then personally reviewed the images and noted that it has been missed measured.  The septum actually measures about 1.85 cm which is more consistent with her prior echo.  I suspect that she does have hypertrophic cardiomyopathy.  There is a family history of this and her brother who actually died with it.  He was advised to have septal myomectomy but declined the surgery.  She continues to have some chest discomfort although coronary CT angiography was performed in June 2022 which demonstrated no coronary calcium and no obstructive coronary disease.  We had discussed the definitive cardiac MRI and she said that she is now interested in  doing that since her spinal stimulator leads have been removed.  She did recently have successful MRI of her back.  10/30/2021  [name] returns today for follow-up.  She underwent cardiac MRI on October 31st 2022 which demonstrated mild asymmetric basal septal hypertrophy measuring only 10 mm.  There was an anomalous cord inserting at the basal septum which likely was measured in addition to the septum to provide a larger measurement on echo.   Overall it is not thought that there is evidence for any hypertrophic cardiomyopathy based on this MRI.  I think this is reassuring and the fact that she had no coronary disease as well by CT makes her dyspnea which is still somewhat persistent, unlikely to be cardiac in origin.  I suspect there may be some degree of deconditioning including issues with chronic pain and having to use a walker.  12/23/2022  [name] is seen today in follow-up.  Recently she has been seen by Laurann Montana, NP for adjustment of her blood pressure medications.  She has had some issues with lower extremity swelling.  This necessitated some decrease in her amlodipine.  She was also on a very high dose of carvedilol 50 mg twice daily which was appropriately cut back to 25 mg twice daily.  Her blood pressure has accordingly gone up somewhat today 150/78.  She is overall done well although after having had recent surgeries including neck surgery with Dr. Ronnald Ramp.  She has been dealing with issues with carpal tunnel syndrome.  PMHx:  Past Medical History:  Diagnosis Date   Anxiety    Aortic atherosclerosis ([place]) 04/16/2021   Atypical angina    Back pain    related to spinal stenosis and disc problem, radiates down left buttocks to leg., weakness occ.   Chest pain    a. 03/2015 Cath: nl cors; b. 03/2021 Cor CTA: Ca2+ = 0. Nl Cors.   Chronic pain syndrome    Dyspnea    GERD (gastroesophageal reflux disease)    Grade I diastolic dysfunction    Headache    Hyperlipidemia    Hypertension    Lumbar post-laminectomy syndrome    LVH (left ventricular hypertrophy) 12/15/2020   a. 11/2020 Echo: EF 65-70%, no rwma, sev asymm LVH with IVSd 1.9 cm. No LVOT obs @ rest. Gr1 DD. Triv MR.   PONV (postoperative nausea and vomiting)    Pulmonary nodules    a. 03/2021 CT Chest: 60m pulm nodules in bilat lower lobes. F/u 1 yr.   Right foot drop    Syncope    a. 11/2020 Zio: No significant arrhythmias.   Vaginal foreign  object    "Uses Femring"    Past Surgical History:  Procedure Laterality Date   ABDOMINAL HYSTERECTOMY     ANTERIOR CERVICAL DECOMP/DISCECTOMY FUSION N/A 01/03/2022   Procedure: Anterior Cervical Decompression Fusion - Cervical four-Cervical five - Cervical five-Cervical six;  Surgeon: JEustace Moore MD;  [place] [facility]  Service: Neurosurgery;  Laterality: N/A;   BIOPSY  12/16/2020   Procedure: BIOPSY;  Surgeon: SLadene Artist MD;  [facility] [facility]  Service: Endoscopy;;   CARDIAC CATHETERIZATION N/A 04/18/2015   Procedure: Left Heart Cath and Coronary Angiography;  Surgeon: MCharolette Forward MD;  [facility];  Service: Cardiovascular;  Laterality: N/A;   COLONOSCOPY     COLONOSCOPY W/ BIOPSIES AND POLYPECTOMY  2018   ESOPHAGOGASTRODUODENOSCOPY (EGD) WITH PROPOFOL N/A 12/16/2020   Procedure: ESOPHAGOGASTRODUODENOSCOPY (EGD) WITH PROPOFOL;  Surgeon: SLucio Edward  T, MD;  Location: [facility];  Service: Endoscopy;  Laterality: N/A;   FOOT SURGERY Bilateral    [place] "bunion,bone spur, tendon" (1) -6'16, (1)-10'16   HEMATOMA EVACUATION N/A 01/05/2022   Procedure: Cervical Wound Exploration;  Surgeon: Ashok Pall, MD;  Location: [facility];  Service: Neurosurgery;  Laterality: N/A;   IR EPIDUROGRAPHY  07/21/2018   LUMBAR LAMINECTOMY/DECOMPRESSION MICRODISCECTOMY Bilateral 12/28/2015   Procedure: MICRO LUMBAR DECOMPRESSION L4 - L5 BILATERALLY;  Surgeon: [name], MD;  Location: [facility];  Service: Orthopedics;  Laterality: Bilateral;   LUMBAR LAMINECTOMY/DECOMPRESSION MICRODISCECTOMY Bilateral 03/04/2018   Procedure: Revision of Microlumbar Decompression Bilateral Lumbar Four-Five;  Surgeon: [name], MD;  Location: [facility];  Service: Orthopedics;  Laterality: Bilateral;  90 mins   SAVORY DILATION N/A 12/16/2020   Procedure: SAVORY DILATION;  Surgeon: Ladene Artist, MD;  Location: [facility];  Service: Endoscopy;  Laterality: N/A;   SPINAL CORD  STIMULATOR INSERTION N/A 09/28/2019   Procedure: THORACIC SPINAL CORD STIMULATOR INSERTION;  Surgeon: Melina Schools, MD;  Location: [place];  Service: Orthopedics;  Laterality: N/A;  2.5 hrs   SPINAL CORD STIMULATOR REMOVAL N/A 05/27/2021   Procedure: LUMBAR SPINAL CORD STIMULATOR REMOVAL;  Surgeon: Deetta Perla, MD;  Location: [facility];  Service: Neurosurgery;  Laterality: N/A;   TUBAL LIGATION     WISDOM TOOTH EXTRACTION     WOUND EXPLORATION N/A 03/04/2018   Procedure: EXPLORATION OF LUMBAR DECOMPRESSION WOUND;  Surgeon: [name], MD;  Location: [place];  Service: Orthopedics;  Laterality: N/A;    FAMHx:  Family History  Problem Relation Age of Onset   Heart attack Mother    Lung cancer Father    Cancer Father    Pancreatic cancer Sister    Breast cancer Sister 86   Multiple myeloma Sister    Breast cancer Sister        diagnosed in her 61's   Heart attack Sister    Throat cancer Brother    Lung cancer Brother    Stomach cancer Cousin    Colon cancer Neg Hx    Colon polyps Neg Hx    Esophageal cancer Neg Hx    Rectal cancer Neg Hx     SOCHx:   reports that she has never smoked. She has never used smokeless tobacco. She reports that she does not drink alcohol and does not use drugs.  ALLERGIES:  Allergies  Allergen Reactions   Cephalosporins Anaphylaxis   Penicillins Anaphylaxis and Hives    Did it involve swelling of the face/tongue/throat, SOB, or low BP? Yes  Did it involve sudden or severe rash/hives, skin peeling, or any reaction on the inside of your mouth or nose? Yes  Did you need to seek medical attention at a hospital or doctor's office? Yes  When did it last happen?      10+ years  If all above answers are "NO", may proceed with cephalosporin use.  Did it involve swelling of the face/tongue/throat, SOB, or low BP? Yes Did it involve sudden or severe rash/hives, skin peeling, or any reaction on the inside of your mouth or nose? Yes Did you need to seek  medical attention at a hospital or doctor's office? Yes When did it last happen?      10+ years If all above answers are "NO", may proceed with cephalosporin use.   Anesthetics, Amide Nausea And Vomiting    Does not know name of it. States they put it on record foot  center.    Betadine [Povidone Iodine] Other (See Comments)    "Skin Burn" caused scar on Left buttock   Latex Hives, Itching, Rash and Other (See Comments)   Peach [Prunus Persica] Hives   Claritin [Loratadine]     Rash, pruritis     ROS: Pertinent items noted in HPI and remainder of comprehensive ROS otherwise negative.  HOME MEDS: Current Outpatient Medications on File Prior to Visit  Medication Sig Dispense Refill   acetaminophen (TYLENOL) 500 MG tablet Take 1,000 mg by mouth every 6 (six) hours as needed for headache.     ALPRAZolam (XANAX) 0.5 MG tablet Take 0.5-1 tablets (0.25-0.5 mg total) by mouth at bedtime as needed for anxiety. 30 tablet 2   amitriptyline (ELAVIL) 25 MG tablet Take 1 tablet (25 mg total) by mouth every evening at 8pm. 30 tablet 2   amLODipine (NORVASC) 5 MG tablet Take 1 tablet by mouth daily.     atorvastatin (LIPITOR) 20 MG tablet Take 1 tablet by mouth daily.     baclofen (LIORESAL) 10 MG tablet TAKE 1 TABLET BY MOUTH THREE TIMES DAILY AS NEEDED 90 tablet 3   baclofen (LIORESAL) 10 MG tablet Take 1 tablet by mouth 3 (three) times daily as needed.     betamethasone dipropionate (DIPROLENE) 0.05 % ointment Apply as directed to affected area twice a day. Taper use as able as directed. 45 g 3   betamethasone dipropionate 0.05 % lotion Apply a small amount to affected area once a day 60 mL 11   busPIRone (BUSPAR) 5 MG tablet Take 1 tablet by mouth 2  times daily. (Patient taking differently: Take 5 mg by mouth 2 (two) times daily as needed (anxiety).) 180 tablet 1   carvedilol (COREG) 25 MG tablet Take 1 tablet (25 mg total) by mouth 2 (two) times daily with a meal. 180 tablet 3   cholecalciferol  (VITAMIN D3) 25 MCG (1000 UNIT) tablet Take 1,000 Units by mouth daily.     diazepam (VALIUM) 5 MG tablet Take 1 tablet (5 mg total) by mouth 30 minutes prior to MRI. 1 tablet 0   diphenhydrAMINE (BENADRYL) 25 MG tablet Take 25 mg by mouth daily as needed for itching.     Estradiol Acetate (FEMRING) 0.05 MG/24HR RING USE ONE RING VAGINALLY EVERY 3 MONTHS AS DIRECTED 1 each 4   gabapentin (NEURONTIN) 600 MG tablet Take 1 tablet (600 mg total) by mouth 3 (three) times daily. 90 tablet 3   hydrALAZINE (APRESOLINE) 25 MG tablet Take 1 tablet (25 mg total) by mouth in the morning and at bedtime. 180 tablet 3   meclizine (ANTIVERT) 25 MG tablet Take 25 mg by mouth 3 (three) times daily as needed for dizziness.     methocarbamol (ROBAXIN) 500 MG tablet Take 1 tablet (500 mg total) by mouth 3 (three) times daily as needed for muscle spasms. 30 tablet 1   methocarbamol (ROBAXIN) 500 MG tablet Take by mouth.     methylPREDNISolone (MEDROL DOSEPAK) 4 MG TBPK tablet Take by mouth as directed on package 21 tablet 0   neomycin-bacitracin-polymyxin (NEOSPORIN) ointment Apply 1 application topically as needed for wound care.     omeprazole (PRILOSEC) 40 MG capsule Take 1 capsule by mouth daily.     ondansetron (ZOFRAN) 4 MG tablet Take 1 tablet (4 mg tablet) 30 - 60 minutes each prep dose 2 tablet 0   oxyCODONE-acetaminophen (PERCOCET) 7.5-325 MG tablet Take 1 tablet by mouth daily as needed for severe  pain.     telmisartan (MICARDIS) 40 MG tablet      traMADol (ULTRAM) 50 MG tablet Take by mouth.     No current facility-administered medications on file prior to visit.    LABS/IMAGING: No results found for this or any previous visit (from the past 48 hour(s)). No results found.  LIPID PANEL:    Component Value Date/Time   CHOL 187 04/07/2022 0906   TRIG 48.0 04/07/2022 0906   HDL 61.50 04/07/2022 0906   CHOLHDL 3 04/07/2022 0906   VLDL 9.6 04/07/2022 0906   LDLCALC 116 (H) 04/07/2022 0906      WEIGHTS: Wt Readings from Last 3 Encounters:  12/23/22 175 lb 9.6 oz (79.7 kg)  12/10/22 169 lb (76.7 kg)  09/30/22 168 lb (76.2 kg)    VITALS: BP (!) 140/84   Pulse 85   Ht '5\' 5"'$  (1.651 m)   Wt 175 lb 9.6 oz (79.7 kg)   BMI 29.22 kg/m   EXAM: General appearance: alert and no distress Lungs: clear to auscultation bilaterally Heart: regular rate and rhythm, S1, S2 normal, no murmur, click, rub or gallop Extremities: extremities normal, atraumatic, no cyanosis or edema Neurologic: Grossly normal  EKG: Normal sinus rhythm at 85-personally reviewed  ASSESSMENT: Chest pain/dyspnea-no evidence of coronary artery disease by CT coronary angiography in 03/2021, minimal aortic atherosclerosis Echocardiographic features of hypertrophic cardiomyopathy, IVSD 19 mm (11/2020), however cardiac MRI did not show HOCM and basal septum measured 10 mm with an anomalous cord (07/2021) No evidence for LVOT obstruction Hypertension No coronary artery disease/coronary calcium by CT coronary angiography (03/2021) Family history of hypertrophic cardiomyopathy in her brother  PLAN: 98.   [name] has recently had some issues with swelling which necessitated decrease in her blood pressure medicines but accordingly her pressure is now somewhat higher.  Initially 150/78 today however I rechecked it and it 140/84.  Given some of her edema and ongoing elevation in blood pressure, would advise increasing her spironolactone from 25 to 50 mg daily.  Potassium recently in December was 4.6.  Will repeat her metabolic profile in 2 weeks.  Advised her to decrease supplemental potassium intake and foods.  Follow-up in 3 months for blood pressure reassessment with Urban Gibson and 6 months with me.  Pixie Casino, MD, [facility], [place] Director of the [facility] Lipid Disorders &  Cardiovascular Risk Reduction Clinic Diplomate of the American Board of Clinical  Lipidology Attending Cardiologist  Direct Dial: [phone]  Fax: [phone]  Website:  [email or web address] 12/23/2022, 10:26 AM

## 2022-12-23 NOTE — Patient Instructions (Addendum)
Medication Instructions:  INCREASE spironolactone to '50mg'$  daily  *If you need a refill on your cardiac medications before your next appointment, please call your pharmacy*   Lab Work: Non-Fasting BMET in 2 weeks  If you have labs (blood work) drawn today and your tests are completely normal, you will receive your results only by: [place] (if you have MyChart) OR A paper copy in the mail If you have any lab test that is abnormal or we need to change your treatment, we will call you to review the results.   Testing/Procedures: NONE   Follow-Up: At [facility], you and your health needs are our priority.  As part of our continuing mission to provide you with exceptional heart care, we have created designated Provider Care Teams.  These Care Teams include your primary Cardiologist (physician) and Advanced Practice Providers (APPs -  Physician Assistants and Nurse Practitioners) who all work together to provide you with the care you need, when you need it.  We recommend signing up for the patient portal called "MyChart".  Sign up information is provided on this After Visit Summary.  MyChart is used to connect with patients for Virtual Visits (Telemedicine).  Patients are able to view lab/test results, encounter notes, upcoming appointments, etc.  Non-urgent messages can be sent to your provider as well.   To learn more about what you can do with MyChart, go to [email or web address].    Your next appointment:    3 months with Laurann Montana NP  6 months with Dr. Debara Pickett

## 2022-12-24 ENCOUNTER — Ambulatory Visit: Payer: PPO

## 2022-12-24 ENCOUNTER — Ambulatory Visit
Admission: RE | Admit: 2022-12-24 | Discharge: 2022-12-24 | Disposition: A | Discharge status: Home / Self Care | Payer: PPO | Source: Ambulatory Visit | Attending: Obstetrics | Admitting: Obstetrics

## 2022-12-24 DIAGNOSIS — N644 Mastodynia: Secondary | ICD-10-CM | POA: Diagnosis not present

## 2023-01-05 ENCOUNTER — Other Ambulatory Visit (HOSPITAL_COMMUNITY): Payer: Self-pay

## 2023-01-05 ENCOUNTER — Other Ambulatory Visit: Payer: Self-pay

## 2023-01-05 MED ORDER — METHOCARBAMOL 500 MG PO TABS
500.0000 mg | ORAL_TABLET | Freq: Three times a day (TID) | ORAL | 1 refills | Status: DC | PRN
  Filled 2023-01-05: qty 30, 10d supply, fill #0
  Filled 2023-02-21: qty 30, 10d supply, fill #1

## 2023-01-06 DIAGNOSIS — G5601 Carpal tunnel syndrome, right upper limb: Secondary | ICD-10-CM | POA: Diagnosis not present

## 2023-01-06 DIAGNOSIS — Z6828 Body mass index (BMI) 28.0-28.9, adult: Secondary | ICD-10-CM | POA: Diagnosis not present

## 2023-01-06 DIAGNOSIS — I10 Essential (primary) hypertension: Secondary | ICD-10-CM | POA: Diagnosis not present

## 2023-01-06 LAB — BASIC METABOLIC PANEL
BUN/Creatinine Ratio: 12 (ref 12–28)
BUN: 13 mg/dL (ref 8–27)
CO2: 28 mmol/L (ref 20–29)
Calcium: 9.5 mg/dL (ref 8.7–10.3)
Chloride: 101 mmol/L (ref 96–106)
Creatinine, Ser: 1.12 mg/dL — ABNORMAL HIGH (ref 0.57–1.00)
Glucose: 76 mg/dL (ref 70–99)
Potassium: 4.4 mmol/L (ref 3.5–5.2)
Sodium: 139 mmol/L (ref 134–144)
eGFR: 56 mL/min/{1.73_m2} — ABNORMAL LOW (ref >59)

## 2023-01-19 ENCOUNTER — Other Ambulatory Visit (HOSPITAL_BASED_OUTPATIENT_CLINIC_OR_DEPARTMENT_OTHER): Payer: Self-pay | Admitting: Family

## 2023-01-19 ENCOUNTER — Other Ambulatory Visit (HOSPITAL_COMMUNITY): Payer: Self-pay

## 2023-01-19 ENCOUNTER — Other Ambulatory Visit: Payer: Self-pay | Admitting: Family Medicine

## 2023-01-19 ENCOUNTER — Other Ambulatory Visit: Payer: Self-pay

## 2023-01-19 DIAGNOSIS — I10 Essential (primary) hypertension: Secondary | ICD-10-CM

## 2023-01-19 DIAGNOSIS — E785 Hyperlipidemia, unspecified: Secondary | ICD-10-CM

## 2023-01-19 MED ORDER — ATORVASTATIN CALCIUM 20 MG PO TABS
20.0000 mg | ORAL_TABLET | Freq: Every day | ORAL | 2 refills | Status: DC
  Filled 2023-01-19: qty 90, 90d supply, fill #0
  Filled 2023-04-27: qty 90, 90d supply, fill #1
  Filled 2023-07-28: qty 90, 90d supply, fill #2

## 2023-01-20 ENCOUNTER — Other Ambulatory Visit: Payer: Self-pay

## 2023-01-20 ENCOUNTER — Other Ambulatory Visit (HOSPITAL_COMMUNITY): Payer: Self-pay

## 2023-01-20 MED ORDER — AMITRIPTYLINE HCL 50 MG PO TABS
50.0000 mg | ORAL_TABLET | Freq: Every evening | ORAL | 0 refills | Status: DC
  Filled 2023-01-20: qty 90, 90d supply, fill #0

## 2023-01-23 ENCOUNTER — Other Ambulatory Visit (HOSPITAL_COMMUNITY): Payer: Self-pay

## 2023-02-04 ENCOUNTER — Other Ambulatory Visit (HOSPITAL_COMMUNITY): Payer: Self-pay

## 2023-02-05 ENCOUNTER — Other Ambulatory Visit (HOSPITAL_COMMUNITY): Payer: Self-pay

## 2023-02-05 MED ORDER — AMITRIPTYLINE HCL 25 MG PO TABS
25.0000 mg | ORAL_TABLET | Freq: Every day | ORAL | 3 refills | Status: DC
  Filled 2023-02-05: qty 90, 90d supply, fill #0
  Filled 2023-05-11: qty 90, 90d supply, fill #1

## 2023-02-05 MED ORDER — BETAMETHASONE DIPROPIONATE 0.05 % EX LOTN
TOPICAL_LOTION | CUTANEOUS | 0 refills | Status: DC
  Filled 2023-02-05: qty 60, 30d supply, fill #0

## 2023-02-06 ENCOUNTER — Other Ambulatory Visit (HOSPITAL_COMMUNITY): Payer: Self-pay

## 2023-02-06 ENCOUNTER — Other Ambulatory Visit: Payer: Self-pay

## 2023-02-16 ENCOUNTER — Other Ambulatory Visit (HOSPITAL_COMMUNITY): Payer: Self-pay

## 2023-02-16 DIAGNOSIS — G5601 Carpal tunnel syndrome, right upper limb: Secondary | ICD-10-CM | POA: Diagnosis not present

## 2023-02-16 MED ORDER — HYDROCODONE-ACETAMINOPHEN 5-325 MG PO TABS
1.0000 | ORAL_TABLET | Freq: Four times a day (QID) | ORAL | 0 refills | Status: DC | PRN
  Filled 2023-02-16: qty 20, 5d supply, fill #0

## 2023-02-20 ENCOUNTER — Other Ambulatory Visit (HOSPITAL_COMMUNITY): Payer: Self-pay

## 2023-02-20 ENCOUNTER — Other Ambulatory Visit: Payer: Self-pay

## 2023-02-21 ENCOUNTER — Other Ambulatory Visit (HOSPITAL_COMMUNITY): Payer: Self-pay

## 2023-03-07 ENCOUNTER — Other Ambulatory Visit (HOSPITAL_COMMUNITY): Payer: Self-pay

## 2023-03-07 ENCOUNTER — Other Ambulatory Visit (HOSPITAL_BASED_OUTPATIENT_CLINIC_OR_DEPARTMENT_OTHER): Payer: Self-pay | Admitting: Family

## 2023-03-07 DIAGNOSIS — I10 Essential (primary) hypertension: Secondary | ICD-10-CM

## 2023-03-09 ENCOUNTER — Other Ambulatory Visit (HOSPITAL_COMMUNITY): Payer: Self-pay

## 2023-03-09 ENCOUNTER — Other Ambulatory Visit: Payer: Self-pay

## 2023-03-09 MED ORDER — AMLODIPINE BESYLATE 5 MG PO TABS
5.0000 mg | ORAL_TABLET | Freq: Every day | ORAL | 0 refills | Status: DC
  Filled 2023-03-09: qty 90, 90d supply, fill #0

## 2023-03-09 NOTE — Telephone Encounter (Signed)
Rx(s) sent to pharmacy electronically.  

## 2023-03-11 ENCOUNTER — Ambulatory Visit: Payer: PPO | Admitting: Physical Medicine & Rehabilitation

## 2023-03-16 DIAGNOSIS — N951 Menopausal and female climacteric states: Secondary | ICD-10-CM | POA: Diagnosis not present

## 2023-03-16 DIAGNOSIS — Z113 Encounter for screening for infections with a predominantly sexual mode of transmission: Secondary | ICD-10-CM | POA: Diagnosis not present

## 2023-03-16 DIAGNOSIS — B354 Tinea corporis: Secondary | ICD-10-CM | POA: Diagnosis not present

## 2023-03-16 DIAGNOSIS — Z01411 Encounter for gynecological examination (general) (routine) with abnormal findings: Secondary | ICD-10-CM | POA: Diagnosis not present

## 2023-03-16 DIAGNOSIS — Z6828 Body mass index (BMI) 28.0-28.9, adult: Secondary | ICD-10-CM | POA: Diagnosis not present

## 2023-03-16 DIAGNOSIS — Z124 Encounter for screening for malignant neoplasm of cervix: Secondary | ICD-10-CM | POA: Diagnosis not present

## 2023-03-16 DIAGNOSIS — Z0142 Encounter for cervical smear to confirm findings of recent normal smear following initial abnormal smear: Secondary | ICD-10-CM | POA: Diagnosis not present

## 2023-03-16 DIAGNOSIS — Z90711 Acquired absence of uterus with remaining cervical stump: Secondary | ICD-10-CM | POA: Diagnosis not present

## 2023-03-16 DIAGNOSIS — Z01419 Encounter for gynecological examination (general) (routine) without abnormal findings: Secondary | ICD-10-CM | POA: Diagnosis not present

## 2023-03-17 ENCOUNTER — Other Ambulatory Visit: Payer: Self-pay

## 2023-03-17 ENCOUNTER — Ambulatory Visit (INDEPENDENT_AMBULATORY_CARE_PROVIDER_SITE_OTHER): Payer: PPO | Admitting: Family

## 2023-03-17 ENCOUNTER — Encounter (HOSPITAL_BASED_OUTPATIENT_CLINIC_OR_DEPARTMENT_OTHER): Payer: Self-pay | Admitting: Family

## 2023-03-17 ENCOUNTER — Other Ambulatory Visit (HOSPITAL_COMMUNITY): Payer: Self-pay

## 2023-03-17 VITALS — BP 140/84 | HR 83 | Ht 65.0 in

## 2023-03-17 DIAGNOSIS — R002 Palpitations: Secondary | ICD-10-CM

## 2023-03-17 DIAGNOSIS — I10 Essential (primary) hypertension: Secondary | ICD-10-CM

## 2023-03-17 DIAGNOSIS — I6522 Occlusion and stenosis of left carotid artery: Secondary | ICD-10-CM | POA: Diagnosis not present

## 2023-03-17 DIAGNOSIS — I517 Cardiomegaly: Secondary | ICD-10-CM

## 2023-03-17 MED ORDER — HYDRALAZINE HCL 50 MG PO TABS
ORAL_TABLET | ORAL | 1 refills | Status: AC
Start: 2023-03-17 — End: ?
  Filled 2023-03-17: qty 90, 45d supply, fill #0
  Filled 2023-05-16: qty 90, 45d supply, fill #1

## 2023-03-17 MED ORDER — FEMRING 0.05 MG/24HR VA RING
1.0000 | VAGINAL_RING | VAGINAL | 4 refills | Status: DC
  Filled 2023-03-17: qty 1, 90d supply, fill #0
  Filled 2023-05-16: qty 1, 84d supply, fill #0
  Filled 2023-08-28 (×2): qty 1, 84d supply, fill #1
  Filled 2023-12-02: qty 1, 84d supply, fill #2
  Filled 2024-03-03: qty 1, 84d supply, fill #3

## 2023-03-17 MED ORDER — CLOTRIMAZOLE-BETAMETHASONE 1-0.05 % EX CREA
TOPICAL_CREAM | Freq: Two times a day (BID) | CUTANEOUS | 0 refills | Status: DC
  Filled 2023-03-17: qty 45, 14d supply, fill #0

## 2023-03-17 NOTE — Progress Notes (Signed)
Office Visit    Patient Name: [name] Date of Encounter: 03/17/2023  PCP:  [place], [name], MD   [facility]  Cardiologist:  Chrystie Nose, MD  Advanced Practice Provider:  No care team member to display Electrophysiologist:  None      Chief Complaint    [name] is a 62 y.o. female  presents today for hypertension  Past Medical History    Past Medical History:  Diagnosis Date   Anxiety    Aortic atherosclerosis (HCC) 04/16/2021   Atypical angina    Back pain    related to spinal stenosis and disc problem, radiates down left buttocks to leg., weakness occ.   Chest pain    a. 03/2015 Cath: nl cors; b. 03/2021 Cor CTA: Ca2+ = 0. Nl Cors.   Chronic pain syndrome    Dyspnea    GERD (gastroesophageal reflux disease)    Grade I diastolic dysfunction    Headache    Hyperlipidemia    Hypertension    Lumbar post-laminectomy syndrome    LVH (left ventricular hypertrophy) 12/15/2020   a. 11/2020 Echo: EF 65-70%, no rwma, sev asymm LVH with IVSd 1.9 cm. No LVOT obs @ rest. Gr1 DD. Triv MR.   PONV (postoperative nausea and vomiting)    Pulmonary nodules    a. 03/2021 CT Chest: 3mm pulm nodules in bilat lower lobes. F/u 1 yr.   Right foot drop    Syncope    a. 11/2020 Zio: No significant arrhythmias.   Vaginal foreign object    "Uses Femring"   Past Surgical History:  Procedure Laterality Date   ABDOMINAL HYSTERECTOMY     ANTERIOR CERVICAL DECOMP/DISCECTOMY FUSION N/A 01/03/2022   Procedure: Anterior Cervical Decompression Fusion - Cervical four-Cervical five - Cervical five-Cervical six;  Surgeon: Tia Alert, MD;  Location: [facility] OR;  Service: Neurosurgery;  Laterality: N/A;   BIOPSY  12/16/2020   Procedure: BIOPSY;  Surgeon: Meryl Dare, MD;  Location: [facility] ENDOSCOPY;  Service: Endoscopy;;   CARDIAC CATHETERIZATION N/A 04/18/2015   Procedure: Left Heart Cath and Coronary Angiography;  Surgeon: Rinaldo Cloud, MD;   Location: [facility] INVASIVE CV LAB;  Service: Cardiovascular;  Laterality: N/A;   COLONOSCOPY     COLONOSCOPY W/ BIOPSIES AND POLYPECTOMY  2018   ESOPHAGOGASTRODUODENOSCOPY (EGD) WITH PROPOFOL N/A 12/16/2020   Procedure: ESOPHAGOGASTRODUODENOSCOPY (EGD) WITH PROPOFOL;  Surgeon: Meryl Dare, MD;  Location: [facility] ENDOSCOPY;  Service: Endoscopy;  Laterality: N/A;   FOOT SURGERY Bilateral    [facility] "bunion,bone spur, tendon" (1) -6'16, (1)-10'16   HEMATOMA EVACUATION N/A 01/05/2022   Procedure: Cervical Wound Exploration;  Surgeon: Coletta Memos, MD;  Location: [facility] OR;  Service: Neurosurgery;  Laterality: N/A;   IR EPIDUROGRAPHY  07/21/2018   LUMBAR LAMINECTOMY/DECOMPRESSION MICRODISCECTOMY Bilateral 12/28/2015   Procedure: MICRO LUMBAR DECOMPRESSION L4 - L5 BILATERALLY;  Surgeon: Jene Every, MD;  Location: [facility] ORS;  Service: Orthopedics;  Laterality: Bilateral;   LUMBAR LAMINECTOMY/DECOMPRESSION MICRODISCECTOMY Bilateral 03/04/2018   Procedure: Revision of Microlumbar Decompression Bilateral Lumbar Four-Five;  Surgeon: Jene Every, MD;  Location: [facility] OR;  Service: Orthopedics;  Laterality: Bilateral;  90 mins   SAVORY DILATION N/A 12/16/2020   Procedure: SAVORY DILATION;  Surgeon: Meryl Dare, MD;  Location: [facility] ENDOSCOPY;  Service: Endoscopy;  Laterality: N/A;   SPINAL CORD STIMULATOR INSERTION N/A 09/28/2019   Procedure: THORACIC SPINAL CORD STIMULATOR INSERTION;  Surgeon: Venita Lick, MD;  Location: [facility] OR;  Service:  Orthopedics;  Laterality: N/A;  2.5 hrs   SPINAL CORD STIMULATOR REMOVAL N/A 05/27/2021   Procedure: LUMBAR SPINAL CORD STIMULATOR REMOVAL;  Surgeon: Lucy Chris, MD;  Location: [facility] ORS;  Service: Neurosurgery;  Laterality: N/A;   TUBAL LIGATION     WISDOM TOOTH EXTRACTION     WOUND EXPLORATION N/A 03/04/2018   Procedure: EXPLORATION OF LUMBAR DECOMPRESSION WOUND;  Surgeon: Jene Every, MD;  Location: [facility] OR;  Service: Orthopedics;  Laterality: N/A;    Allergies  Allergies  Allergen Reactions   Cephalosporins Anaphylaxis   Penicillins Anaphylaxis and Hives    Did it involve swelling of the face/tongue/throat, SOB, or low BP? Yes  Did it involve sudden or severe rash/hives, skin peeling, or any reaction on the inside of your mouth or nose? Yes  Did you need to seek medical attention at a hospital or doctor's office? Yes  When did it last happen?      10+ years  If all above answers are "NO", may proceed with cephalosporin use.  Did it involve swelling of the face/tongue/throat, SOB, or low BP? Yes Did it involve sudden or severe rash/hives, skin peeling, or any reaction on the inside of your mouth or nose? Yes Did you need to seek medical attention at a hospital or doctor's office? Yes When did it last happen?      10+ years If all above answers are "NO", may proceed with cephalosporin use.   Anesthetics, Amide Nausea And Vomiting    Does not know name of it. States they put it on [facility].    Betadine [Povidone Iodine] Other (See Comments)    "Skin Burn" caused scar on Left buttock   Latex Hives, Itching, Rash and Other (See Comments)   Peach [Prunus Persica] Hives   Claritin [Loratadine]     Rash, pruritis     History of Present Illness    [name] is a 62 y.o. female with a hx of HTN, HLD, carotid stenosis, lumbar back pain, anxiety last seen 12/23/22  Prior cardiac cath 2016 with no coronary disease. 03/2021 cardiac CT with coronary calcium score of 0. 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  Clinic visit 05/09/22 for elevated BP and chest pain in setting of losing her sister unexpectedly due to stroke. Telmisartan increased. Chest pain was  atypical for angina and thought to be related to grief, stress. She was provided Hydralazine 25mg  PRN for SBP >180. At follow up 06/03/22 no real improvement in BP Telmisartan stopped to allow for renin-aldosterone level to be checked. Started on Chlorthalidone 25mg  QD. Coreg 50mg  BID continued. Due to reports of lightheadedness, falls, and questionable syncope ZIO, carotid duplex, echo ordered. Catecholamines, metanephrines unremarkable. Aldosterone level normal but renin-aldosterone ratio elevated.   She presented for her carotid duplex and echo 06/16/22 she became unresponsive with seizure like activity. Transported via EMS to ED but left AMA prior to workup. Had follow up at her PCP office and referred to neurology. Subsequent sodium loading with normal 24h urine sodium (creatinine, potassium unfortunately not collected due to alb error).   At visit 06/20/22 Spironolactone held for further evaluation of possible hyperaldosteronism. Chlorthalidone and Carvedilol continued, Hydralazine 50mg  TID initiated.   Monitor 06/16/2022 predominantly normal sinus rhythm, first-degree AV block, 1 run SVT  4 beats max rate 122 bpm.  Isolated nonconducted beat noticed (Mobitz 2).  Echo 07/03/2022 normal LVEF, moderate LVH, no significant valve abnormalities. Carotid duplex 07/03/22 left ICA 1 to 39% stenosis.  Seen 08/02/2022 and due to lower extremity edema amlodipine reduced to 5 mg daily.  Carvedilol reduced to 25 mg twice daily-it was unclear where she was taking 50 mg twice daily.  Hydralazine 25 mg twice daily added for blood pressure control.  Last saw Dr. Rennis Golden 12/23/2022 with BP 150/78 with repeat 140/84.  Spironolactone increased from 25 to 50 mg daily.  Subsequent BMP stable.  Presents today for follow-up independently. Since last seen had a fall a month ago. No lightheadedness, dizziness. She fell backwards down her steps as she missed a step as was not using assistive device nor wearing her leg brace. Has had  persistent headaches since that time. Has taken Tylenol with minimal improvement. When she lays down and rests it improves.  Encouraged to discuss with PCP. She has had carpal tunnel surgery with improvement in pain symptoms. [facility]. BP at home high as 160-170 when checked 2-3x last week. Reports no recent LE edema.   EKGs/Labs/Other Studies Reviewed:   The following studies were reviewed today:  ZIO 06/24/22 Patch Wear Time:  12 days and 9 hours (2023-08-08T11:21:57-0400 to 2023-08-20T20:47:57-0400)   Patient had a min HR of 32 bpm, max HR of 136 bpm, and avg HR of 79 bpm. Predominant underlying rhythm was Sinus Rhythm. First Degree AV Block was present. 1 run of Supraventricular Tachycardia occurred lasting 4 beats with a max rate of 122 bpm (avg 115  bpm). Second Degree AV Block-Mobitz II, single non-conducted P-wave was present. Isolated SVEs were rare (<1.0%), SVE Couplets were rare (<1.0%), and no SVE Triplets were present. Isolated VEs were rare (<1.0%), VE Couplets were rare (<1.0%), and no VE Triplets were  present.   One brief run of SVT lasting 4 beats was noted, otherwise, no significant arrhythmias or ectopy.  One non-conducted beat was noted (Mobitz II).  Echo 07/03/22   1. Left ventricular ejection fraction, by estimation, is 60 to 65%. The  left ventricle has normal function. The left ventricle has no regional  wall motion abnormalities. There is moderate asymmetric left ventricular  hypertrophy of the basal-septal  segment. Left ventricular diastolic parameters were normal. The average  left ventricular global longitudinal strain is -20.2 %.   2. Right ventricular systolic function is normal. The right ventricular  size is normal. There is normal pulmonary artery systolic pressure. The  estimated right ventricular systolic pressure is 26.6 mmHg.   3. The mitral valve is normal in structure. Trivial mitral valve  regurgitation.   4. The aortic valve  was not well visualized. Aortic valve regurgitation  is not visualized. No aortic stenosis is present.   5. The inferior vena cava is normal in size with greater than 50%  respiratory variability, suggesting right atrial pressure of 3 mmHg.   Carotid duplex 07/03/22 Summary:  Right Carotid: The extracranial vessels were near-normal with only minimal  wall  thickening or plaque.   Left Carotid: Velocities in the left ICA are consistent with a 1-39%  stenosis.   Vertebrals: Bilateral vertebral arteries demonstrate antegrade flow.  Subclavians: Normal flow hemodynamics were seen in bilateral subclavian  arteries.   EKG:  EKG is not ordered today.   Recent Labs: 04/07/2022: ALT 14 06/06/2022: TSH 2.610 01/06/2023: BUN 13; Creatinine, Ser 1.12; Potassium 4.4; Sodium  139  Recent Lipid Panel    Component Value Date/Time   CHOL 187 04/07/2022 0906   TRIG 48.0 04/07/2022 0906   HDL 61.50 04/07/2022 0906   CHOLHDL 3 04/07/2022 0906   VLDL 9.6 04/07/2022 0906   LDLCALC 116 (H) 04/07/2022 0906     Home Medications   Current Meds  Medication Sig   acetaminophen (TYLENOL) 500 MG tablet Take 1,000 mg by mouth every 6 (six) hours as needed for headache.   ALPRAZolam (XANAX) 0.5 MG tablet Take 0.5-1 tablets (0.25-0.5 mg total) by mouth at bedtime as needed for anxiety.   amitriptyline (ELAVIL) 25 MG tablet Take 1 tablet (25 mg total) by mouth daily.   amLODipine (NORVASC) 5 MG tablet Take 1 tablet (5 mg total) by mouth daily.   atorvastatin (LIPITOR) 20 MG tablet Take 1 tablet (20 mg total) by mouth daily.   baclofen (LIORESAL) 10 MG tablet Take 1 tablet by mouth 3 (three) times daily as needed.   betamethasone dipropionate (DIPROLENE) 0.05 % ointment Apply as directed to affected area twice a day. Taper use as able as directed.   betamethasone dipropionate 0.05 % lotion Apply a small amount to affected area once a day   busPIRone (BUSPAR) 5 MG tablet Take 1 tablet by mouth 2  times daily.  (Patient taking differently: Take 5 mg by mouth 2 (two) times daily as needed (anxiety).)   carvedilol (COREG) 25 MG tablet Take 1 tablet (25 mg total) by mouth 2 (two) times daily with a meal.   cholecalciferol (VITAMIN D3) 25 MCG (1000 UNIT) tablet Take 1,000 Units by mouth daily.   diazepam (VALIUM) 5 MG tablet Take 1 tablet (5 mg total) by mouth 30 minutes prior to MRI.   diphenhydrAMINE (BENADRYL) 25 MG tablet Take 25 mg by mouth daily as needed for itching.   Estradiol Acetate (FEMRING) 0.05 MG/24HR RING USE ONE RING VAGINALLY EVERY 3 MONTHS AS DIRECTED   gabapentin (NEURONTIN) 600 MG tablet Take 1 tablet (600 mg total) by mouth 3 (three) times daily.   hydrALAZINE (APRESOLINE) 25 MG tablet Take 1 tablet (25 mg total) by mouth in the morning and at bedtime.   HYDROcodone-acetaminophen (NORCO/VICODIN) 5-325 MG tablet Take 1 tablet by mouth every 6 hours as needed for pain.   meclizine (ANTIVERT) 25 MG tablet Take 25 mg by mouth 3 (three) times daily as needed for dizziness.   methocarbamol (ROBAXIN) 500 MG tablet Take 1 tablet (500 mg total) by mouth 3 (three) times daily as needed for muscle spasms.   methylPREDNISolone (MEDROL DOSEPAK) 4 MG TBPK tablet Take by mouth as directed on package   neomycin-bacitracin-polymyxin (NEOSPORIN) ointment Apply 1 application topically as needed for wound care.   omeprazole (PRILOSEC) 40 MG capsule Take 1 capsule by mouth daily.   ondansetron (ZOFRAN) 4 MG tablet Take 1 tablet (4 mg tablet) 30 - 60 minutes each prep dose   oxyCODONE-acetaminophen (PERCOCET) 7.5-325 MG tablet Take 1 tablet by mouth daily as needed for severe pain.   spironolactone (ALDACTONE) 50 MG tablet Take 1 tablet (50 mg total) by mouth daily.   telmisartan (MICARDIS) 40 MG tablet    traMADol (ULTRAM) 50 MG tablet Take by mouth.     Review of Systems      All other systems reviewed and are otherwise negative except as noted above.  Physical Exam    VS:  BP (!) 140/84   Pulse  83   Ht 5\' 5"  (1.651 m)   BMI  29.22 kg/m  , BMI Body mass index is 29.22 kg/m.  Wt Readings from Last 3 Encounters:  12/23/22 175 lb 9.6 oz (79.7 kg)  12/10/22 169 lb (76.7 kg)  09/30/22 168 lb (76.2 kg)   GEN: Well nourished, well developed, in no acute distress. HEENT: normal. Neck: Supple, no JVD, carotid bruits, or masses. Cardiac: RRR, no murmurs, rubs, or gallops. No clubbing, cyanosis, edema.  Radials/PT 2+ and equal bilaterally.  Respiratory:  Respirations regular and unlabored, clear to auscultation bilaterally. GI: Soft, nontender, nondistended. MS: No deformity or atrophy. Skin: Warm and dry, no rash. Neuro:  Strength and sensation are intact. Psych: Normal affect. Tearful.   Assessment & Plan    Chest pain in adult - 03/2021 coronary calcium score 0. Cardiac MRI with no evidence of HCM. No recurrent chest pain. No indication for ischemic evaluation.   HTN / LVH - BP not at goal but improved. Renin-aldo level elevated, but aldosterone normal. Subsequent salt loading normal 24h sodium (K, creatinine unfortunately not collected due to lab error). Metanephrines/catecholamines, TSH unremarkable. Previously tested negative for OSA, no snoring. Increase hydralazine to 50 mg twice daily.  Continue amlodipine 5 mg daily, carvedilol 25 mg twice daily, spironolactone 50 mg daily, telmisartan 40 mg daily.  Previously did not tolerate higher doses of amlodipine due to edema.  Discussed to monitor BP at home at least 2 hours after medications and sitting for 5-10 minutes.  Palpitations / Syncope / Carotid artery stenosis -  ZIO 05/2022 no significant arrhythmia. Echo/23 normal LVEF and no significant valvular abnormality. Carotid duplex 06/2022 with only mild left ICA stenosis 1-39% with plans for repeat duplex in one year. No significant abnormalities to explain syncope. Continue to follow with neurology.  No recurrent syncope.  HLD - Continue Atorvastatin 20mg  QD. LDL goal <70.    Disposition: Follow up as scheduled in August with Chrystie Nose, MD   Signed, Alver Sorrow, NP 03/17/2023, 10:01 AM [facility]

## 2023-03-17 NOTE — Patient Instructions (Addendum)
Medication Instructions:  Your physician has recommended you make the following change in your medication:    INCREASE Hydralazine to 50mg  twice daily  *If you need a refill on your cardiac medications before your next appointment, please call your pharmacy*  Testing/Procedures: Please schedule for carotid duplex in September as previously ordered for monitoring of left carotid stenosis.   Follow-Up: At [facility], you and your health needs are our priority.  As part of our continuing mission to provide you with exceptional heart care, we have created designated Provider Care Teams.  These Care Teams include your primary Cardiologist (physician) and Advanced Practice Providers (APPs -  Physician Assistants and Nurse Practitioners) who all work together to provide you with the care you need, when you need it.  We recommend signing up for the patient portal called "MyChart".  Sign up information is provided on this After Visit Summary.  MyChart is used to connect with patients for Virtual Visits (Telemedicine).  Patients are able to view lab/test results, encounter notes, upcoming appointments, etc.  Non-urgent messages can be sent to your provider as well.   To learn more about what you can do with MyChart, go to [email or web address].    Your next appointment:   As scheduled with Dr. Rennis Golden in August  Other Instructions Tips to Measure your Blood Pressure Correctly  Here's what you can do to ensure a correct reading:  Don't drink a caffeinated beverage or smoke during the 30 minutes before the test.  Sit quietly for five minutes before the test begins.  During the measurement, sit in a chair with your feet on the floor and your arm supported so your elbow is at about heart level.  The inflatable part of the cuff should completely cover at least 80% of your upper arm, and the cuff should be placed on bare skin, not over a shirt.  Don't talk during the measurement.   Blood  pressure categories  Blood pressure category SYSTOLIC (upper number)  DIASTOLIC (lower number)  Normal Less than 120 mm Hg and Less than 80 mm Hg  Elevated 120-129 mm Hg and Less than 80 mm Hg  High blood pressure: Stage 1 hypertension 130-139 mm Hg or 80-89 mm Hg  High blood pressure: Stage 2 hypertension 140 mm Hg or higher or 90 mm Hg or higher  Hypertensive crisis (consult your doctor immediately) Higher than 180 mm Hg and/or Higher than 120 mm Hg  Source: American Heart Association and American Stroke Association. For more on getting your blood pressure under control, buy Controlling Your Blood Pressure, a Special Health Report from [facility].   Blood Pressure Log   Date   Time  Blood Pressure  Example: Nov 1 9 AM 124/78

## 2023-03-27 ENCOUNTER — Other Ambulatory Visit (HOSPITAL_COMMUNITY): Payer: Self-pay

## 2023-03-30 ENCOUNTER — Other Ambulatory Visit (HOSPITAL_COMMUNITY): Payer: Self-pay

## 2023-03-31 ENCOUNTER — Ambulatory Visit: Payer: PPO | Admitting: Family Medicine

## 2023-03-31 ENCOUNTER — Other Ambulatory Visit (HOSPITAL_COMMUNITY): Payer: Self-pay

## 2023-03-31 MED ORDER — METHYLPREDNISOLONE 4 MG PO TBPK
ORAL_TABLET | ORAL | 0 refills | Status: DC
  Filled 2023-03-31: qty 21, 6d supply, fill #0

## 2023-04-01 ENCOUNTER — Encounter: Payer: Self-pay | Admitting: Physical Medicine & Rehabilitation

## 2023-04-01 ENCOUNTER — Other Ambulatory Visit: Payer: Self-pay

## 2023-04-01 ENCOUNTER — Encounter: Payer: PPO | Attending: Physical Medicine & Rehabilitation | Admitting: Physical Medicine & Rehabilitation

## 2023-04-01 VITALS — BP 125/84 | HR 80 | Ht 65.0 in | Wt 175.0 lb

## 2023-04-01 DIAGNOSIS — F418 Other specified anxiety disorders: Secondary | ICD-10-CM | POA: Insufficient documentation

## 2023-04-01 DIAGNOSIS — M5416 Radiculopathy, lumbar region: Secondary | ICD-10-CM | POA: Insufficient documentation

## 2023-04-01 DIAGNOSIS — G894 Chronic pain syndrome: Secondary | ICD-10-CM | POA: Diagnosis not present

## 2023-04-01 DIAGNOSIS — M4722 Other spondylosis with radiculopathy, cervical region: Secondary | ICD-10-CM | POA: Insufficient documentation

## 2023-04-01 NOTE — Patient Instructions (Signed)
ALWAYS FEEL FREE TO CALL OUR OFFICE WITH ANY PROBLEMS OR QUESTIONS ([phone])  **PLEASE NOTE** ALL MEDICATION REFILL REQUESTS (INCLUDING CONTROLLED SUBSTANCES) NEED TO BE MADE AT LEAST 7 DAYS PRIOR TO REFILL BEING DUE. ANY REFILL REQUESTS INSIDE THAT TIME FRAME MAY RESULT IN DELAYS IN RECEIVING YOUR PRESCRIPTION.                    

## 2023-04-01 NOTE — Progress Notes (Addendum)
Subjective:    Patient ID: [name], female    DOB: 1961-10-08, 62 y.o.   MRN: [id number]  HPI [name] is back regarding her chronic pain. She had emg/ncs which was + for cts. She had release surgery by ortho and has done well since. Her motor and sensory function has improved. She had a fall prior to the surgery which prompter her to proceed.   She is working on a HEP for gait, balance, and strength. She last had therapy in the fall. She uses her R AFO but gait can be uneven. It is uncomfortable as well since her fall.   She uses tramadol rarely for pain.   Mood has been good .  Pain Inventory Average Pain 4 Pain Right Now 4 My pain is intermittent, burning, tingling, and aching  LOCATION OF PAIN  lower back  BOWEL Number of stools per week: 2 Oral laxative use No  Type of laxative n/a Enema or suppository use No  History of colostomy No  Incontinent No   BLADDER Normal In and out cath, frequency n/a Able to self cath  n/a Bladder incontinence No  Frequent urination No  Leakage with coughing No  Difficulty starting stream No  Incomplete bladder emptying No    Mobility use a cane use a walker ability to climb steps?  yes do you drive?  no use a wheelchair Do you have any goals in this area?  no  Function disabled: date disabled 2022  Neuro/Psych weakness trouble walking anxiety  Prior Studies N/a  Physicians involved in your care Follow-up   Family History  Problem Relation Age of Onset   Heart attack Mother    Lung cancer Father    Cancer Father    Pancreatic cancer Sister    Breast cancer Sister 101   Multiple myeloma Sister    Breast cancer Sister        diagnosed in her 4's   Heart attack Sister    Throat cancer Brother    Lung cancer Brother    Stomach cancer Cousin    Colon cancer Neg Hx    Colon polyps Neg Hx    Esophageal cancer Neg Hx    Rectal cancer Neg Hx    Social History   Socioeconomic History    Marital status: Married    Spouse name: Soil scientist   Number of children: 1   Years of education: Not on file   Highest education level: Not on file  Occupational History   Not on file  Tobacco Use   Smoking status: Never   Smokeless tobacco: Never  Vaping Use   Vaping Use: Never used  Substance and Sexual Activity   Alcohol use: Never   Drug use: Never   Sexual activity: Yes    Partners: Male    Birth control/protection: Post-menopausal, Surgical  Other Topics Concern   Not on file  Social History Narrative   Not on file   Social Determinants of Health   Financial Resource Strain: Low Risk  (09/29/2022)   Overall Financial Resource Strain (CARDIA)    Difficulty of Paying Living Expenses: Not hard at all  Food Insecurity: No Food Insecurity (09/29/2022)   Hunger Vital Sign    Worried About Running Out of Food in the Last Year: Never true    Ran Out of Food in the Last Year: Never true  Transportation Needs: No Transportation Needs (09/29/2022)   PRAPARE - Transportation    Lack of  Transportation (Medical): No    Lack of Transportation (Non-Medical): No  Physical Activity: Inactive (09/29/2022)   Exercise Vital Sign    Days of Exercise per Week: 0 days    Minutes of Exercise per Session: 0 min  Stress: No Stress Concern Present (09/29/2022)   [employer] of Occupational Health - Occupational Stress Questionnaire    Feeling of Stress : Not at all  Social Connections: Socially Integrated (09/29/2022)   Social Connection and Isolation Panel [NHANES]    Frequency of Communication with Friends and Family: More than three times a week    Frequency of Social Gatherings with Friends and Family: More than three times a week    Attends Religious Services: More than 4 times per year    Active Member of [employer] or Organizations: Yes    Attends Engineer, structural: More than 4 times per year    Marital Status: Married   Past Surgical History:  Procedure Laterality Date    ABDOMINAL HYSTERECTOMY     ANTERIOR CERVICAL DECOMP/DISCECTOMY FUSION N/A 01/03/2022   Procedure: Anterior Cervical Decompression Fusion - Cervical four-Cervical five - Cervical five-Cervical six;  Surgeon: Tia Alert, MD;  Location: [facility] OR;  Service: Neurosurgery;  Laterality: N/A;   BIOPSY  12/16/2020   Procedure: BIOPSY;  Surgeon: Meryl Dare, MD;  Location: [facility] ENDOSCOPY;  Service: Endoscopy;;   CARDIAC CATHETERIZATION N/A 04/18/2015   Procedure: Left Heart Cath and Coronary Angiography;  Surgeon: Rinaldo Cloud, MD;  Location: [facility] INVASIVE CV LAB;  Service: Cardiovascular;  Laterality: N/A;   COLONOSCOPY     COLONOSCOPY W/ BIOPSIES AND POLYPECTOMY  2018   ESOPHAGOGASTRODUODENOSCOPY (EGD) WITH PROPOFOL N/A 12/16/2020   Procedure: ESOPHAGOGASTRODUODENOSCOPY (EGD) WITH PROPOFOL;  Surgeon: Meryl Dare, MD;  Location: [facility] ENDOSCOPY;  Service: Endoscopy;  Laterality: N/A;   FOOT SURGERY Bilateral    [facility] "bunion,bone spur, tendon" (1) -6'16, (1)-10'16   HEMATOMA EVACUATION N/A 01/05/2022   Procedure: Cervical Wound Exploration;  Surgeon: Coletta Memos, MD;  Location: [facility] OR;  Service: Neurosurgery;  Laterality: N/A;   IR EPIDUROGRAPHY  07/21/2018   LUMBAR LAMINECTOMY/DECOMPRESSION MICRODISCECTOMY Bilateral 12/28/2015   Procedure: MICRO LUMBAR DECOMPRESSION L4 - L5 BILATERALLY;  Surgeon: Jene Every, MD;  Location: [facility] ORS;  Service: Orthopedics;  Laterality: Bilateral;   LUMBAR LAMINECTOMY/DECOMPRESSION MICRODISCECTOMY Bilateral 03/04/2018   Procedure: Revision of Microlumbar Decompression Bilateral Lumbar Four-Five;  Surgeon: Jene Every, MD;  Location: [facility] OR;  Service: Orthopedics;  Laterality: Bilateral;  90 mins   SAVORY DILATION N/A 12/16/2020   Procedure: SAVORY DILATION;  Surgeon: Meryl Dare, MD;  Location: [facility] ENDOSCOPY;  Service: Endoscopy;  Laterality: N/A;   SPINAL CORD STIMULATOR INSERTION N/A 09/28/2019   Procedure: THORACIC SPINAL CORD STIMULATOR  INSERTION;  Surgeon: Venita Lick, MD;  Location: [facility] OR;  Service: Orthopedics;  Laterality: N/A;  2.5 hrs   SPINAL CORD STIMULATOR REMOVAL N/A 05/27/2021   Procedure: LUMBAR SPINAL CORD STIMULATOR REMOVAL;  Surgeon: Lucy Chris, MD;  Location: [facility] ORS;  Service: Neurosurgery;  Laterality: N/A;   TUBAL LIGATION     WISDOM TOOTH EXTRACTION     WOUND EXPLORATION N/A 03/04/2018   Procedure: EXPLORATION OF LUMBAR DECOMPRESSION WOUND;  Surgeon: Jene Every, MD;  Location: [facility] OR;  Service: Orthopedics;  Laterality: N/A;   Past Medical History:  Diagnosis Date   Anxiety    Aortic atherosclerosis (HCC) 04/16/2021   Atypical angina    Back pain    related to spinal stenosis and disc  problem, radiates down left buttocks to leg., weakness occ.   Chest pain    a. 03/2015 Cath: nl cors; b. 03/2021 Cor CTA: Ca2+ = 0. Nl Cors.   Chronic pain syndrome    Dyspnea    GERD (gastroesophageal reflux disease)    Grade I diastolic dysfunction    Headache    Hyperlipidemia    Hypertension    Lumbar post-laminectomy syndrome    LVH (left ventricular hypertrophy) 12/15/2020   a. 11/2020 Echo: EF 65-70%, no rwma, sev asymm LVH with IVSd 1.9 cm. No LVOT obs @ rest. Gr1 DD. Triv MR.   PONV (postoperative nausea and vomiting)    Pulmonary nodules    a. 03/2021 CT Chest: 3mm pulm nodules in bilat lower lobes. F/u 1 yr.   Right foot drop    Syncope    a. 11/2020 Zio: No significant arrhythmias.   Vaginal foreign object    "Uses Femring"   BP 125/84   Pulse 80   Ht 5\' 5"  (1.651 m)   Wt 175 lb (79.4 kg) Comment: per last visit pt unable to stand  SpO2 97%   BMI 29.12 kg/m   Opioid Risk Score:   Fall Risk Score:  `1  Depression screen PHQ 2/9     12/10/2022    1:40 PM 09/29/2022   11:37 AM 09/29/2022    9:01 AM 04/07/2022    8:38 AM 11/27/2021    9:50 AM 10/04/2021    8:35 AM 09/12/2021   11:26 AM  Depression screen PHQ 2/9  Decreased Interest 0 0 2 0 0 0 0  Down, Depressed, Hopeless 0 0 0 1 0 0  0  PHQ - 2 Score 0 0 2 1 0 0 0  Altered sleeping  0 1 1  0   Tired, decreased energy  0 0 1  0   Change in appetite  0 0 1  0   Feeling bad or failure about yourself   0 0 0  0   Trouble concentrating  0 0 0  0   Moving slowly or fidgety/restless  0 0 0  0   Suicidal thoughts  0 0 0  0   PHQ-9 Score  0 3 4  0   Difficult doing work/chores  Not difficult at all Not difficult at all Somewhat difficult        Review of Systems  Constitutional:        High blood sugar  All other systems reviewed and are negative.      Objective:   Physical Exam General: No acute distress HEENT: NCAT, EOMI, oral membranes moist Cards: reg rate  Chest: normal effort Abdomen: Soft, NT, ND Skin: dry, intact Extremities: no edema Psych: pleasant and appropriate  Skin: intact.  Surgical sites CDI Neuro: RLE 3/5 to 3/5-prox -->trace distally... LLE 4/5 grossly. Sensory loss distally bilaterally.  Upper extremities are 4+ to 5 out of 5 on the right and  4 out of 5 on the left with some pain inhibition.  Improved left arm movement.  RIght hand with improved sensation.  Musculoskeletal: Low back generally tender to palpation.       Assessment & Plan:  1. Post Lumbar Laminectomy / Decompression: Lumbar Radiculopathy:             -gabapentin continue at 300mg  tid             -tramadol  for breakthrough severe pain.      -worse after  fall.    -[facility] improve gait and lower ext strength.  -have requested a [place] AFO due to increased RLE weakness. Her current AFO doesn't provide enough knee stability in stance. The [place] AFO will allow her to safely ambulate and stand at home and in the community       2. left BPPV-improved             -discussed being aware of safety, intake, etc             -acclimation 3. Anxiety. PCP Following and prescribing Alprazolam.               -buspar, xanax prn per primary 4. Spasms: may continue robaxin 5. Orthotics:              -continue AFO 6.  Cervical spondylosis with radiculopath at C6.                       -improved after C4-5, C5-6 ACDF 7. Right hand pain: + CTS,  -good results with release  15 minutes of face to face patient care time were spent during this visit. All questions were encouraged and answered.  Follow up with me in 4 mos .

## 2023-04-13 ENCOUNTER — Other Ambulatory Visit: Payer: Self-pay

## 2023-04-13 ENCOUNTER — Other Ambulatory Visit: Payer: Self-pay | Admitting: Family Medicine

## 2023-04-13 ENCOUNTER — Other Ambulatory Visit (HOSPITAL_COMMUNITY): Payer: Self-pay

## 2023-04-13 DIAGNOSIS — F419 Anxiety disorder, unspecified: Secondary | ICD-10-CM

## 2023-04-13 MED ORDER — METHOCARBAMOL 500 MG PO TABS
500.0000 mg | ORAL_TABLET | Freq: Three times a day (TID) | ORAL | 1 refills | Status: DC | PRN
  Filled 2023-04-13: qty 30, 10d supply, fill #0
  Filled 2023-06-10: qty 30, 10d supply, fill #1

## 2023-04-13 NOTE — Progress Notes (Signed)
HPI: [name] is a 62 y.o. female with PMHx significant for HTN,HLD,CKD III, chronic pain, MDD,and GAD here today for chronic disease management.  Last seen on 09/29/22 Since her last visit she has followed with physical medicine and rehab, cardiology, and ophthalmologist.  Today she reports of having fallen several times, which has worsened her carpal tunnel symptoms and back pain.   She has been experiencing intermittent episodes of dizziness and left parietal-occipital headaches with one recent episode of severe nausea. She describes the headache as a pressure headache, dull, and achy.  She has not identified exacerbating factors. Rest helps. Cervical and lumbar radiculopathy with right foot drop, follows with ortho and phys med and on chronic opioid treatment. She has had headaches in the past, July 2022 a head CT showed no acute intracranial abnormality.  She has been seeing Dr. Hermelinda Medicus and is scheduled to start PT next week.  Additionally, the patient reports a burning sensation in her chest area for the past month, which sometimes makes it difficult for her to swallow food.  She has not been taking any medication for this issue, took Omeprazole in the past.  Hyperlipidemia: Currently she is on atorvastatin 20 mg daily. Lab Results  Component Value Date   CHOL 187 04/07/2022   HDL 61.50 04/07/2022   LDLCALC 116 (H) 04/07/2022   TRIG 48.0 04/07/2022   CHOLHDL 3 04/07/2022   Hypertension: Currently on spironolactone 50 mg daily, carvedilol 25 mg twice daily, amlodipine 5 mg daily, and telmisartan 40 mg daily. Negative for visual changes, exertional chest pain, dyspnea, new focal weakness, or edema. CKD 3: Negative for gross hematuria, foam in urine, or decreased urine output.  Lab Results  Component Value Date   CREATININE 1.12 (H) 01/06/2023   BUN 13 01/06/2023   NA 139 01/06/2023   K 4.4 01/06/2023   CL 101 01/06/2023   CO2 28 01/06/2023   Anxiety  and depression: Currently she is on BuSpar 5 mg twice daily and alprazolam 0.5 mg 1/2 to 1 tablet daily as needed. Medication is helping.  She is currently on amitriptyline 25 mg daily, started by dermatologist to help with skin pruritus.  Review of Systems  Constitutional:  Positive for fatigue. Negative for chills and fever.  HENT:  Negative for mouth sores and sore throat.   Respiratory:  Negative for cough and wheezing.   Gastrointestinal:  Negative for abdominal pain and vomiting.  Genitourinary:  Negative for decreased urine volume, dysuria and hematuria.  Musculoskeletal:  Positive for arthralgias, back pain, gait problem and neck pain.  Skin:  Negative for rash.  Neurological:  Negative for syncope and facial asymmetry.  Psychiatric/Behavioral:  Negative for confusion. The patient is nervous/anxious.   See other pertinent positives and negatives in HPI.  Current Outpatient Medications on File Prior to Visit  Medication Sig Dispense Refill   acetaminophen (TYLENOL) 500 MG tablet Take 1,000 mg by mouth every 6 (six) hours as needed for headache.     amitriptyline (ELAVIL) 25 MG tablet Take 1 tablet (25 mg total) by mouth daily. 90 tablet 3   amLODipine (NORVASC) 5 MG tablet Take 1 tablet (5 mg total) by mouth daily. 90 tablet 0   atorvastatin (LIPITOR) 20 MG tablet Take 1 tablet (20 mg total) by mouth daily. 90 tablet 2   baclofen (LIORESAL) 10 MG tablet Take 1 tablet by mouth 3 (three) times daily as needed.     betamethasone dipropionate (DIPROLENE) 0.05 % ointment Apply as directed  to affected area twice a day. Taper use as able as directed. 45 g 3   betamethasone dipropionate 0.05 % lotion Apply a small amount to affected area once a day 60 mL 0   busPIRone (BUSPAR) 5 MG tablet Take 1 tablet by mouth 2  times daily. (Patient taking differently: Take 5 mg by mouth 2 (two) times daily as needed (anxiety).) 180 tablet 1   carvedilol (COREG) 25 MG tablet Take 1 tablet (25 mg total) by  mouth 2 (two) times daily with a meal. 180 tablet 3   cholecalciferol (VITAMIN D3) 25 MCG (1000 UNIT) tablet Take 1,000 Units by mouth daily.     clotrimazole-betamethasone (LOTRISONE) cream APPLY TO THE AFFECTED AND SURROUNDING AREAS OF SKIN BY TOPICAL ROUTE 2 TIMES PER DAY IN THE MORNING AND EVENING FOR 2 WEEKS 45 g 0   diphenhydrAMINE (BENADRYL) 25 MG tablet Take 25 mg by mouth daily as needed for itching.     Estradiol Acetate (FEMRING) 0.05 MG/24HR RING USE ONE RING VAGINALLY EVERY 3 MONTHS AS DIRECTED 1 each 4   gabapentin (NEURONTIN) 600 MG tablet Take 1 tablet (600 mg total) by mouth 3 (three) times daily. 90 tablet 3   hydrALAZINE (APRESOLINE) 50 MG tablet Take 1 tablet (50 mg total) by mouth in the morning AND 1 tablet (50 mg total) at bedtime. 90 tablet 1   HYDROcodone-acetaminophen (NORCO/VICODIN) 5-325 MG tablet Take 1 tablet by mouth every 6 hours as needed for pain. 20 tablet 0   meclizine (ANTIVERT) 25 MG tablet Take 25 mg by mouth 3 (three) times daily as needed for dizziness.     methocarbamol (ROBAXIN) 500 MG tablet Take 1 tablet (500 mg total) by mouth 3 (three) times daily as needed for muscle spasms. 30 tablet 1   methocarbamol (ROBAXIN) 500 MG tablet Take 1 tablet (500 mg total) by mouth 3 (three) times daily as needed for muscle spasms. 30 tablet 1   methylPREDNISolone (MEDROL DOSEPAK) 4 MG TBPK tablet Take by mouth as directed on package 21 tablet 0   methylPREDNISolone (MEDROL DOSEPAK) 4 MG TBPK tablet take as directed 21 tablet 0   neomycin-bacitracin-polymyxin (NEOSPORIN) ointment Apply 1 application topically as needed for wound care.     ondansetron (ZOFRAN) 4 MG tablet Take 1 tablet (4 mg tablet) 30 - 60 minutes each prep dose 2 tablet 0   oxyCODONE-acetaminophen (PERCOCET) 7.5-325 MG tablet Take 1 tablet by mouth daily as needed for severe pain.     spironolactone (ALDACTONE) 50 MG tablet Take 1 tablet (50 mg total) by mouth daily. 90 tablet 3   telmisartan (MICARDIS)  40 MG tablet      traMADol (ULTRAM) 50 MG tablet Take by mouth.     ALPRAZolam (XANAX) 0.5 MG tablet Take 0.5-1 tablets (0.25-0.5 mg total) by mouth at bedtime as needed for anxiety. 30 tablet 3   No current facility-administered medications on file prior to visit.    Past Medical History:  Diagnosis Date   Anxiety    Aortic atherosclerosis (HCC) 04/16/2021   Atypical angina    Back pain    related to spinal stenosis and disc problem, radiates down left buttocks to leg., weakness occ.   Chest pain    a. 03/2015 Cath: nl cors; b. 03/2021 Cor CTA: Ca2+ = 0. Nl Cors.   Chronic pain syndrome    Dyspnea    GERD (gastroesophageal reflux disease)    Grade I diastolic dysfunction    Headache  Hyperlipidemia    Hypertension    Lumbar post-laminectomy syndrome    LVH (left ventricular hypertrophy) 12/15/2020   a. 11/2020 Echo: EF 65-70%, no rwma, sev asymm LVH with IVSd 1.9 cm. No LVOT obs @ rest. Gr1 DD. Triv MR.   PONV (postoperative nausea and vomiting)    Pulmonary nodules    a. 03/2021 CT Chest: 3mm pulm nodules in bilat lower lobes. F/u 1 yr.   Right foot drop    Syncope    a. 11/2020 Zio: No significant arrhythmias.   Vaginal foreign object    "Uses Femring"   Allergies  Allergen Reactions   Cephalosporins Anaphylaxis   Penicillins Anaphylaxis and Hives    Did it involve swelling of the face/tongue/throat, SOB, or low BP? Yes  Did it involve sudden or severe rash/hives, skin peeling, or any reaction on the inside of your mouth or nose? Yes  Did you need to seek medical attention at a hospital or doctor's office? Yes  When did it last happen?      10+ years  If all above answers are "NO", may proceed with cephalosporin use.  Did it involve swelling of the face/tongue/throat, SOB, or low BP? Yes Did it involve sudden or severe rash/hives, skin peeling, or any reaction on the inside of your mouth or nose? Yes Did you need to seek medical attention at a hospital or doctor's  office? Yes When did it last happen?      10+ years If all above answers are "NO", may proceed with cephalosporin use.   Anesthetics, Amide Nausea And Vomiting    Does not know name of it. States [facility].    Betadine [Povidone Iodine] Other (See Comments)    "Skin Burn" caused scar on Left buttock   Latex Hives, Itching, Rash and Other (See Comments)   Peach [Prunus Persica] Hives   Claritin [Loratadine]     Rash, pruritis    Social History   Socioeconomic History   Marital status: Married    Spouse name: Cena Benton   Number of children: 1   Years of education: Not on file   Highest education level: Not on file  Occupational History   Not on file  Tobacco Use   Smoking status: Never   Smokeless tobacco: Never  Vaping Use   Vaping Use: Never used  Substance and Sexual Activity   Alcohol use: Never   Drug use: Never   Sexual activity: Yes    Partners: Male    Birth control/protection: Post-menopausal, Surgical  Other Topics Concern   Not on file  Social History Narrative   Not on file   Social Determinants of Health   Financial Resource Strain: Low Risk  (09/29/2022)   Overall Financial Resource Strain (CARDIA)    Difficulty of Paying Living Expenses: Not hard at all  Food Insecurity: No Food Insecurity (09/29/2022)   Hunger Vital Sign    Worried About Running Out of Food in the Last Year: Never true    Ran Out of Food in the Last Year: Never true  Transportation Needs: No Transportation Needs (09/29/2022)   PRAPARE - Administrator, Civil Service (Medical): No    Lack of Transportation (Non-Medical): No  Physical Activity: Inactive (09/29/2022)   Exercise Vital Sign    Days of Exercise per Week: 0 days    Minutes of Exercise per Session: 0 min  Stress: No Stress Concern Present (09/29/2022)   [place]  [employer] of Occupational Health - Occupational Stress Questionnaire    Feeling of Stress : Not at all  Social Connections:  Socially Integrated (09/29/2022)   Social Connection and Isolation Panel [NHANES]    Frequency of Communication with Friends and Family: More than three times a week    Frequency of Social Gatherings with Friends and Family: More than three times a week    Attends Religious Services: More than 4 times per year    Active Member of Clubs or Organizations: Yes    Attends Banker Meetings: More than 4 times per year    Marital Status: Married   Vitals:   04/14/23 0921  BP: 128/80  Pulse: 82  Resp: 16  Temp: 97.7 F (36.5 C)  SpO2: 99%   Body mass index is 29.12 kg/m.  Physical Exam Vitals and nursing note reviewed.  Constitutional:      General: She is not in acute distress.    Appearance: She is well-developed.  HENT:     Head: Normocephalic and atraumatic.     Comments: Tender scalp left-sided occipital and parietal area with palpation.    Mouth/Throat:     Mouth: Mucous membranes are moist.     Pharynx: Oropharynx is clear.  Eyes:     Conjunctiva/sclera: Conjunctivae normal.  Cardiovascular:     Rate and Rhythm: Normal rate and regular rhythm.     Heart sounds: No murmur heard. Pulmonary:     Effort: Pulmonary effort is normal. No respiratory distress.     Breath sounds: Normal breath sounds.  Abdominal:     Palpations: Abdomen is soft.     Tenderness: There is no abdominal tenderness.  Musculoskeletal:     Cervical back: Tenderness present. No bony tenderness. Pain with movement present. Decreased range of motion.  Skin:    General: Skin is warm.     Findings: No erythema or rash.  Neurological:     General: No focal deficit present.     Mental Status: She is alert and oriented to person, place, and time.     Cranial Nerves: No cranial nerve deficit.     Gait: Gait normal.     Comments: Right foot drop, gait assisted with a walker.  Psychiatric:        Mood and Affect: Affect normal. Mood is anxious.   ASSESSMENT AND PLAN:  Ms. [name] was seen  today for medical management of chronic issues.  Diagnoses and all orders for this visit:  Stage 3a chronic kidney disease [facility]) Assessment & Plan: Problem has been stable. Continue adequate hydration, low salt diet,and avoidance of NSAID's. Continue Telmisartan 40 mg daily.  Headache, unspecified headache type We discussed possible etiologies. ? Tension headache. She is on Amitriptyline 25 mg daily, started by dermatologist. I do not think imaging is needed at this time. PT targeting cervical muscles may help. Instructed about warning signs.  Hyperlipidemia, unspecified hyperlipidemia type Assessment & Plan: She is not fasting today. Continue Atorvastatin 20 mg daily and low fat diet. If she does not have FLP done during her next appt with cardiologist, we can do it here in the office next follow up.  Essential hypertension Assessment & Plan: BP adequately controlled. Continue Spironolactone 50 mg daily, Carvedilol 25 mg bid, Telmisartan 40 mg daily,and Amlodipine 5 mg daily. Low salt diet to continue. Eye exam is current.  GAD (generalized anxiety disorder) Assessment & Plan: Reports improvement. Continue Buspar 5 mg bid and Alprazolam 0.5 mg daily prn.  Gastroesophageal reflux disease, unspecified whether esophagitis present Assessment & Plan: Omeprazole 40 mg 30 min before breakfast started today. GERD precautions also recommended. If symptoms do not improved, we can try a different PPI but if persistent GI evaluation may be necessary.  Orders: -     Omeprazole; Take 1 capsule (40 mg total) by mouth daily.  Dispense: 90 capsule; Refill: 1  Frequent falls Assessment & Plan: Several risk factors, including co morbilities and medications. Fall precautions discussed. Planning in starting PT next week.  Return in about 6 months (around 10/14/2023) for chronic problems.  Jamarkis Branam G. [place], MD  [facility]. [place] office.

## 2023-04-13 NOTE — Telephone Encounter (Signed)
Okay for refill?  

## 2023-04-14 ENCOUNTER — Encounter: Payer: Self-pay | Admitting: Family Medicine

## 2023-04-14 ENCOUNTER — Other Ambulatory Visit: Payer: Self-pay

## 2023-04-14 ENCOUNTER — Ambulatory Visit (INDEPENDENT_AMBULATORY_CARE_PROVIDER_SITE_OTHER): Payer: PPO | Admitting: Family Medicine

## 2023-04-14 VITALS — BP 128/80 | HR 82 | Temp 97.7°F | Resp 16 | Ht 65.0 in

## 2023-04-14 DIAGNOSIS — K219 Gastro-esophageal reflux disease without esophagitis: Secondary | ICD-10-CM | POA: Diagnosis not present

## 2023-04-14 DIAGNOSIS — E785 Hyperlipidemia, unspecified: Secondary | ICD-10-CM

## 2023-04-14 DIAGNOSIS — F411 Generalized anxiety disorder: Secondary | ICD-10-CM

## 2023-04-14 DIAGNOSIS — N1831 Chronic kidney disease, stage 3a: Secondary | ICD-10-CM | POA: Diagnosis not present

## 2023-04-14 DIAGNOSIS — I10 Essential (primary) hypertension: Secondary | ICD-10-CM | POA: Diagnosis not present

## 2023-04-14 DIAGNOSIS — R296 Repeated falls: Secondary | ICD-10-CM | POA: Insufficient documentation

## 2023-04-14 DIAGNOSIS — R519 Headache, unspecified: Secondary | ICD-10-CM | POA: Diagnosis not present

## 2023-04-14 MED ORDER — OMEPRAZOLE 40 MG PO CPDR
40.0000 mg | DELAYED_RELEASE_CAPSULE | Freq: Every day | ORAL | 1 refills | Status: DC
Start: 2023-04-14 — End: 2023-09-05
  Filled 2023-04-14: qty 90, 90d supply, fill #0

## 2023-04-14 MED ORDER — ALPRAZOLAM 0.5 MG PO TABS
0.2500 mg | ORAL_TABLET | Freq: Every evening | ORAL | 3 refills | Status: DC | PRN
Start: 2023-04-14 — End: 2023-10-16
  Filled 2023-04-14: qty 30, 30d supply, fill #0
  Filled 2023-06-10: qty 30, 30d supply, fill #1
  Filled 2023-08-11 – 2023-08-12 (×2): qty 30, 30d supply, fill #2

## 2023-04-14 NOTE — Assessment & Plan Note (Signed)
Reports improvement. Continue Buspar 5 mg bid and Alprazolam 0.5 mg daily prn.

## 2023-04-14 NOTE — Patient Instructions (Addendum)
A few things to remember from today's visit:  Essential hypertension  Hyperlipidemia, unspecified hyperlipidemia type  GAD (generalized anxiety disorder)  Stage 3a chronic kidney disease (HCC)  Gastroesophageal reflux disease, unspecified whether esophagitis present - Plan: omeprazole (PRILOSEC) 40 MG capsule  Headache, unspecified headache type Omeprazole 30 min before breakfast for 6-8 weeks. Headache seems to be tension like headache. No changes today.  If you need refills for medications you take chronically, please call your pharmacy. Do not use My Chart to request refills or for acute issues that need immediate attention. If you send a my chart message, it may take a few days to be addressed, specially if I am not in the office.  Please be sure medication list is accurate. If a new problem present, please set up appointment sooner than planned today.

## 2023-04-14 NOTE — Assessment & Plan Note (Signed)
BP adequately controlled. Continue Spironolactone 50 mg daily, Carvedilol 25 mg bid, Telmisartan 40 mg daily,and Amlodipine 5 mg daily. Low salt diet to continue. Eye exam is current.

## 2023-04-14 NOTE — Assessment & Plan Note (Signed)
She is not fasting today. Continue Atorvastatin 20 mg daily and low fat diet. If she does not have FLP done during her next appt with cardiologist, we can do it here in the office next follow up.

## 2023-04-14 NOTE — Assessment & Plan Note (Addendum)
Problem has been stable. Continue adequate hydration, low salt diet,and avoidance of NSAID's. Continue Telmisartan 40 mg daily.

## 2023-04-15 ENCOUNTER — Other Ambulatory Visit (HOSPITAL_COMMUNITY): Payer: Self-pay

## 2023-04-15 DIAGNOSIS — L821 Other seborrheic keratosis: Secondary | ICD-10-CM | POA: Diagnosis not present

## 2023-04-15 DIAGNOSIS — L304 Erythema intertrigo: Secondary | ICD-10-CM | POA: Diagnosis not present

## 2023-04-15 DIAGNOSIS — R208 Other disturbances of skin sensation: Secondary | ICD-10-CM | POA: Diagnosis not present

## 2023-04-15 MED ORDER — BETAMETHASONE DIPROPIONATE 0.05 % EX CREA
TOPICAL_CREAM | CUTANEOUS | 3 refills | Status: DC
  Filled 2023-04-15: qty 45, 20d supply, fill #0
  Filled 2023-05-04: qty 45, 20d supply, fill #1
  Filled 2023-06-10: qty 45, 20d supply, fill #2
  Filled 2023-07-29: qty 45, 20d supply, fill #3

## 2023-04-16 ENCOUNTER — Other Ambulatory Visit: Payer: Self-pay

## 2023-04-18 NOTE — Assessment & Plan Note (Addendum)
Several risk factors, including co morbilities and medications. Fall precautions discussed. Planning in starting PT next week.

## 2023-04-18 NOTE — Assessment & Plan Note (Signed)
Omeprazole 40 mg 30 min before breakfast started today. GERD precautions also recommended. If symptoms do not improved, we can try a different PPI but if persistent GI evaluation may be necessary.

## 2023-04-21 ENCOUNTER — Ambulatory Visit: Payer: PPO | Attending: Physical Medicine & Rehabilitation | Admitting: Physical Therapy

## 2023-04-21 ENCOUNTER — Encounter: Payer: Self-pay | Admitting: Physical Therapy

## 2023-04-21 DIAGNOSIS — R2681 Unsteadiness on feet: Secondary | ICD-10-CM | POA: Diagnosis not present

## 2023-04-21 DIAGNOSIS — R2689 Other abnormalities of gait and mobility: Secondary | ICD-10-CM | POA: Insufficient documentation

## 2023-04-21 DIAGNOSIS — M5416 Radiculopathy, lumbar region: Secondary | ICD-10-CM | POA: Diagnosis not present

## 2023-04-21 DIAGNOSIS — M6281 Muscle weakness (generalized): Secondary | ICD-10-CM | POA: Diagnosis not present

## 2023-04-21 NOTE — Therapy (Signed)
OUTPATIENT PHYSICAL THERAPY NEURO EVALUATION   Patient Name: [name] MRN: [id number] DOB:07-10-1961, 62 y.o., female Today's Date: 04/22/2023   PCP: [place], [name]., MD REFERRING PROVIDER: Ranelle Oyster, MD  END OF SESSION:  PT End of Session - 04/22/23 1519     Visit Number 1    Number of Visits 17    Date for PT Re-Evaluation 06/19/23    Authorization Type [employer]    Authorization Time Period 04-21-23 - 06-27-23    Progress Note Due on Visit 10    PT Start Time 1018    PT Stop Time 1102    PT Time Calculation (min) 44 min    Equipment Utilized During Treatment Gait belt    Activity Tolerance Other (comment);Patient tolerated treatment well   limited by RLE weakness   Behavior During Therapy [facility] for tasks assessed/performed             Past Medical History:  Diagnosis Date   Anxiety    Aortic atherosclerosis (HCC) 04/16/2021   Atypical angina    Back pain    related to spinal stenosis and disc problem, radiates down left buttocks to leg., weakness occ.   Chest pain    a. 03/2015 Cath: nl cors; b. 03/2021 Cor CTA: Ca2+ = 0. Nl Cors.   Chronic pain syndrome    Dyspnea    GERD (gastroesophageal reflux disease)    Grade I diastolic dysfunction    Headache    Hyperlipidemia    Hypertension    Lumbar post-laminectomy syndrome    LVH (left ventricular hypertrophy) 12/15/2020   a. 11/2020 Echo: EF 65-70%, no rwma, sev asymm LVH with IVSd 1.9 cm. No LVOT obs @ rest. Gr1 DD. Triv MR.   PONV (postoperative nausea and vomiting)    Pulmonary nodules    a. 03/2021 CT Chest: 3mm pulm nodules in bilat lower lobes. F/u 1 yr.   Right foot drop    Syncope    a. 11/2020 Zio: No significant arrhythmias.   Vaginal foreign object    "Uses Femring"   Past Surgical History:  Procedure Laterality Date   ABDOMINAL HYSTERECTOMY     ANTERIOR CERVICAL DECOMP/DISCECTOMY FUSION N/A 01/03/2022   Procedure: Anterior Cervical Decompression Fusion - Cervical  four-Cervical five - Cervical five-Cervical six;  Surgeon: Tia Alert, MD;  Location: [facility] OR;  Service: Neurosurgery;  Laterality: N/A;   BIOPSY  12/16/2020   Procedure: BIOPSY;  Surgeon: Meryl Dare, MD;  Location[facility] ENDOSCOPY;  Service: Endoscopy;;   CARDIAC CATHETERIZATION N/A 04/18/2015   Procedure: Left Heart Cath and Coronary Angiography;  Surgeon: Rinaldo Cloud, MD;  Location: [facility] INVASIVE CV LAB;  Service: Cardiovascular;  Laterality: N/A;   COLONOSCOPY     COLONOSCOPY W/ BIOPSIES AND POLYPECTOMY  2018   ESOPHAGOGASTRODUODENOSCOPY (EGD) WITH PROPOFOL N/A 12/16/2020   Procedure: ESOPHAGOGASTRODUODENOSCOPY (EGD) WITH PROPOFOL;  Surgeon: Meryl Dare, MD;  Location: [facility] ENDOSCOPY;  Service: Endoscopy;  Laterality: N/A;   FOOT SURGERY Bilateral    [facility] "bunion,bone spur, tendon" (1) -6'16, (1)-10'16   HEMATOMA EVACUATION N/A 01/05/2022   Procedure: Cervical Wound Exploration;  Surgeon: Coletta Memos, MD;  Location: [facility] OR;  Service: Neurosurgery;  Laterality: N/A;   IR EPIDUROGRAPHY  07/21/2018   LUMBAR LAMINECTOMY/DECOMPRESSION MICRODISCECTOMY Bilateral 12/28/2015   Procedure: MICRO LUMBAR DECOMPRESSION L4 - L5 BILATERALLY;  Surgeon: Jene Every, MD;  Location: [facility] ORS;  Service: Orthopedics;  Laterality: Bilateral;   LUMBAR LAMINECTOMY/DECOMPRESSION MICRODISCECTOMY Bilateral 03/04/2018  Procedure: Revision of Microlumbar Decompression Bilateral Lumbar Four-Five;  Surgeon: Jene Every, MD;  Location: [facility] OR;  Service: Orthopedics;  Laterality: Bilateral;  90 mins   SAVORY DILATION N/A 12/16/2020   Procedure: SAVORY DILATION;  Surgeon: Meryl Dare, MD;  Location: [facility] ENDOSCOPY;  Service: Endoscopy;  Laterality: N/A;   SPINAL CORD STIMULATOR INSERTION N/A 09/28/2019   Procedure: THORACIC SPINAL CORD STIMULATOR INSERTION;  Surgeon: Venita Lick, MD;  Location: [facility] OR;  Service: Orthopedics;  Laterality: N/A;  2.5 hrs   SPINAL CORD STIMULATOR REMOVAL N/A  05/27/2021   Procedure: LUMBAR SPINAL CORD STIMULATOR REMOVAL;  Surgeon: Lucy Chris, MD;  Location: [facility] ORS;  Service: Neurosurgery;  Laterality: N/A;   TUBAL LIGATION     WISDOM TOOTH EXTRACTION     WOUND EXPLORATION N/A 03/04/2018   Procedure: EXPLORATION OF LUMBAR DECOMPRESSION WOUND;  Surgeon: Jene Every, MD;  Location: [facility] OR;  Service: Orthopedics;  Laterality: N/A;   Patient Active Problem List   Diagnosis Date Noted   Frequent falls 04/14/2023   Gastroesophageal reflux disease 04/14/2023   Right upper extremity numbness 09/29/2022   Stage 3a chronic kidney disease (HCC) 09/29/2022   Cervical spondylosis with radiculopathy 01/05/2022   S/P cervical spinal fusion 01/03/2022   Cervical disc disorder with radiculopathy of cervical region 11/27/2021   Uncontrolled pain 05/27/2021   Syncope 05/13/2021   Depression with anxiety 05/13/2021   Chronic diastolic CHF (congestive heart failure) (HCC) 05/13/2021   Chronic back pain 05/13/2021   Chest tightness 05/13/2021   Lower abdominal pain 05/13/2021   Syncope and collapse 01/01/2021   Abnormal echocardiogram 01/01/2021   BPPV (benign paroxysmal positional vertigo), left 12/26/2020   Odynophagia    Dysphagia 12/15/2020   Postural dizziness with presyncope 12/14/2020   Lumbar post-laminectomy syndrome    Chronic pain 09/28/2019   Lumbar spinal stenosis    Radicular pain    Gait disorder    Difficulty in urination 07/13/2018   Back pain 07/12/2018   Hyperlipidemia 04/16/2018   GAD (generalized anxiety disorder) 04/16/2018   AKI (acute kidney injury) (HCC)    Benign essential HTN    Major depression, recurrent (HCC)    Lumbar radiculopathy 03/09/2018   Paraparesis (HCC) 03/09/2018   Essential hypertension    Chronic nonintractable headache    Leukocytosis    Acute blood loss anemia    Postoperative pain    Generalized weakness    Spinal stenosis at L4-L5 level 03/04/2018   Spinal stenosis of lumbar region 12/28/2015    Chest pain 04/16/2015    ONSET DATE: Referral date 04-01-23  REFERRING DIAG: M54.16 (ICD-10-CM) - Lumbar radiculopathy  THERAPY DIAG:  Muscle weakness (generalized)  Other abnormalities of gait and mobility  Unsteadiness on feet  Rationale for Evaluation and Treatment: Rehabilitation  SUBJECTIVE:  SUBJECTIVE STATEMENT: Pt reports she started using Lofstrand crutch last week which has been much more helpful than the [facility]; pt states she doesn't wear the AFO in the house but is going to start doing so Pt accompanied by: self  PERTINENT HISTORY: Frequent falls:  Spinal cord stimulator insertion on 09-28-19; stimulator removal on 05-27-21 with resultant LLE weakness;  decompression and laminectomy on 12/28/15 at L4-L5  with redo decompression surgery on May 9,2019, post-op paraparesis with surgery to remove small hematoma; 07/12/18 fall at work (involved rolling chair) with hospital admission, transfer to CIR with d/c home 08/03/18; HTN, Bil foot surgery; left ventricular hypertrophy, chronic pain syndrome, dyspnea, hypertrophic cardiomyopathy; cervical fusion March 2023;  had carpal tunnel surgery RUE in April 2024:  LVH  PAIN:  Are you having pain?  Pt reports her back "bothers her"- is constant in occurrence; rates pain 4/10 intensity; worse with bending over; sleeping on heating pad helps  PRECAUTIONS: Fall  WEIGHT BEARING RESTRICTIONS: No  FALLS: Has patient fallen in last 6 months? Yes. Number of falls 3  LIVING ENVIRONMENT: Lives with: lives with their spouse Lives in: House/apartment Stairs: Yes: External: 5 steps; on right going up Has following equipment at home: Single point cane, Wheelchair (manual), and Lofstrand crutch  PLOF: Independent with basic ADLs, Independent with household mobility  with device, Independent with community mobility with device, and Independent with transfers  PATIENT GOALS: improve strength Rt leg  OBJECTIVE:   DIAGNOSTIC FINDINGS: IMPRESSION: 1. Interval anterior discectomy and fusion from C4 through C6 with improved patency of the spinal canal and only mild residual foraminal narrowing. 2. Stable mild spinal stenosis and mild to moderate left foraminal narrowing at C3-4. 3. Stable mild spinal stenosis and mild foraminal narrowing bilaterally at C6-7. Mild foraminal narrowing bilaterally at C7-T1. 4. No cord deformity or abnormal cord signal.  No acute findings. No recent MRI of lumbar region  COGNITION: Overall cognitive status: Within functional limits for tasks assessed   SENSATION: WFL  COORDINATION: Decreased RLE due to weakness and spasticity  POSTURE: No Significant postural limitations  LOWER EXTREMITY ROM:   LLE WFL's;  RLE WFL's except for ankle ROM which is minimal due to weakness; Pt has difficulty performing Rt knee extension due to Rt quad weakness and spasticity - turned body slightly to Lt to achieve more knee extension   LOWER EXTREMITY MMT:    MMT Right Eval Left Eval  Hip flexion 3+   Hip extension    Hip abduction    Hip adduction    Hip internal rotation    Hip external rotation    Knee flexion 3-   Knee extension 3-   Ankle dorsiflexion 2-   Ankle plantarflexion    Ankle inversion    Ankle eversion    (Blank rows = not tested)  BED MOBILITY:  Modified independent  TRANSFERS: Assistive device utilized:  Lofstrand crutch   Sit to stand: Modified independence Stand to sit: Modified independence   STAIRS:  Not tested due to decreased ability to amb. > 30' in eval due to RLE weakness   GAIT: Gait pattern: decreased step length- Right, decreased stance time- Right, decreased hip/knee flexion- Right, and decreased ankle dorsiflexion- Right Distance walked: 50' - from waiting room to 1st treatment  room; 25' during eval in attempt to assess gait velocity but pt fatigued stating "my right leg is going to give out" after amb. 25' and needed to sit down to avoid risk of falling Assistive device  utilized:  1 Lofstrand crutch Level of assistance: SBA and CGA with fatigue due to Rt knee instability Comments: Pt has SIGNIFICANT DECREASED Rt quad strength and muscle endurance  FUNCTIONAL TESTS:  5 times sit to stand: pt performed 3 reps sit to stand with bil. UE support Pt amb. From edge of mirror for gait velocity assessment to chair in balance corner in 33.59 secs at which time needed to sit due to RLE instability  PATIENT SURVEYS:  N/A - based on diagnosis of RLE weakness due to chronic lumbar radiculopathy  TODAY'S TREATMENT:                                                                                                                              DATE: eval only    PATIENT EDUCATION: Education details: Recommended use of RW for more stability but pt declines to use this device at this time; wants to continue to use Lofstrand crutch;  also pt would benefit from more supportive AFO (one with an anterior shelf) to provide more knee stability; pt was shown [employer] [place]er AFO but was not trialed in today's eval session due to time constraint - will trial AFO's in next session Person educated: Patient Education method: Explanation Education comprehension: verbalized understanding  HOME EXERCISE PROGRAM: To be issued  GOALS: Goals reviewed with patient? Yes  SHORT TERM GOALS: Target date: 05-22-23  Evaluate for more supportive AFO for RLE and request order as appropriate. Baseline: pt has [employer]ock posterior strut AFO Goal status: INITIAL  2.  Amb. 62' with RW with SBA without requiring seated rest period. Baseline:  Goal status: INITIAL  3.  Perform 5x sit to stand with bil. UE support with pt demonstrating equal weight bearing on each leg. Baseline: 3x with bil. UE support with  increased weight bearing on LLE Goal status: INITIAL  4.  Pt will participate in TUG assessment with use of RW. Baseline: too fatigued/weak at eval to complete this test Goal status: INITIAL  5.  Negotiate 4 steps with CGA with use of bil. Hand rails Baseline:  Goal status: INITIAL  6.  Independent in HEP for RLE strengthening.  Baseline:  Goal status: INITIAL    LONG TERM GOALS: Target date: 06-19-23  Amb. 88' with RW with SBA without requiring seated rest period. Baseline:  Goal status: INITIAL  2.  Improve TUG score by at least 4 secs with use of RW for assistance with ambulation Baseline: TBA Goal status: INITIAL  3.  Perform 5x sit to stand transfers with LUE support only to demo improved strength in RLE.  Baseline:  Goal status: INITIAL  4.  Modified independent step negotiation with use of bil. Hand rails using step by step sequence. Baseline:  Goal status: INITIAL  5.  Obtain new AFO for increased RLE stability to decrease fall risk with gait. Baseline:  Goal status: INITIAL  6.  Independent in updated HEP for RLE strengthening. Baseline:  Goal status: INITIAL  ASSESSMENT:  CLINICAL IMPRESSION: Patient is a 62 y.o. lady who was seen today for physical therapy evaluation and treatment for RLE weakness and gait impairment due to chronic lumbar radiculopathy.  Pt presents with significant Rt quad weakness and decr. Muscle endurance with spasticity.  Pt is currently using 1 Lofstrand crutch and wearing an [place]ock AFO with posterior strut.  Pt would benefit from use of RW for assistance with ambulation but declines use of this device at this time.  Pt will benefit from skilled PT to address RLE weakness, and gait and balance deficits.     OBJECTIVE IMPAIRMENTS: Abnormal gait, decreased balance, decreased coordination, decreased endurance, decreased strength, and impaired tone.   ACTIVITY LIMITATIONS: carrying, bending, squatting, stairs, transfers, and locomotion  level  PARTICIPATION LIMITATIONS: cleaning, laundry, shopping, and community activity  PERSONAL FACTORS: Behavior pattern, Past/current experiences, and Time since onset of injury/illness/exacerbation are also affecting patient's functional outcome.   REHAB POTENTIAL: Good  CLINICAL DECISION MAKING: Evolving/moderate complexity  EVALUATION COMPLEXITY: Moderate  PLAN:  PT FREQUENCY: 2x/week  PT DURATION: 8 weeks  PLANNED INTERVENTIONS: Therapeutic exercises, Therapeutic activity, Neuromuscular re-education, Balance training, Gait training, Patient/Family education, Self Care, Stair training, Orthotic/Fit training, DME instructions, and Aquatic Therapy  PLAN FOR NEXT SESSION: issue HEP for RLE strengthening; trial of Blue [place]er AFO or Toe off AFO   Rahma Meller, Donavan Burnet, PT 04/22/2023, 4:40 PM

## 2023-04-22 ENCOUNTER — Encounter: Payer: Self-pay | Admitting: Physical Therapy

## 2023-04-27 ENCOUNTER — Other Ambulatory Visit (HOSPITAL_COMMUNITY): Payer: Self-pay

## 2023-05-04 ENCOUNTER — Other Ambulatory Visit (HOSPITAL_COMMUNITY): Payer: Self-pay

## 2023-05-05 ENCOUNTER — Ambulatory Visit: Payer: PPO | Attending: Physical Medicine & Rehabilitation | Admitting: Physical Therapy

## 2023-05-05 DIAGNOSIS — M6281 Muscle weakness (generalized): Secondary | ICD-10-CM | POA: Insufficient documentation

## 2023-05-05 DIAGNOSIS — R2689 Other abnormalities of gait and mobility: Secondary | ICD-10-CM | POA: Diagnosis not present

## 2023-05-05 DIAGNOSIS — R2681 Unsteadiness on feet: Secondary | ICD-10-CM | POA: Insufficient documentation

## 2023-05-05 NOTE — Therapy (Signed)
OUTPATIENT PHYSICAL THERAPY NEURO TREATMENT NOTE   Patient Name: [name] MRN: [id number] DOB:1961/08/30, 62 y.o., female Today's Date: 05/06/2023   PCP: [place], [name]., MD REFERRING PROVIDER: Ranelle Oyster, MD  END OF SESSION:  PT End of Session - 05/06/23 0936     Visit Number 2    Number of Visits 17    Date for PT Re-Evaluation 06/19/23    Authorization Type [employer]    Authorization Time Period 04-21-23 - 06-27-23    Progress Note Due on Visit 10    PT Start Time 1450    PT Stop Time 1536    PT Time Calculation (min) 46 min    Equipment Utilized During Treatment Gait belt    Activity Tolerance Patient tolerated treatment well   limited by RLE weakness   Behavior During Therapy WFL for tasks assessed/performed              Past Medical History:  Diagnosis Date   Anxiety    Aortic atherosclerosis (HCC) 04/16/2021   Atypical angina    Back pain    related to spinal stenosis and disc problem, radiates down left buttocks to leg., weakness occ.   Chest pain    a. 03/2015 Cath: nl cors; b. 03/2021 Cor CTA: Ca2+ = 0. Nl Cors.   Chronic pain syndrome    Dyspnea    GERD (gastroesophageal reflux disease)    Grade I diastolic dysfunction    Headache    Hyperlipidemia    Hypertension    Lumbar post-laminectomy syndrome    LVH (left ventricular hypertrophy) 12/15/2020   a. 11/2020 Echo: EF 65-70%, no rwma, sev asymm LVH with IVSd 1.9 cm. No LVOT obs @ rest. Gr1 DD. Triv MR.   PONV (postoperative nausea and vomiting)    Pulmonary nodules    a. 03/2021 CT Chest: 3mm pulm nodules in bilat lower lobes. F/u 1 yr.   Right foot drop    Syncope    a. 11/2020 Zio: No significant arrhythmias.   Vaginal foreign object    "Uses Femring"   Past Surgical History:  Procedure Laterality Date   ABDOMINAL HYSTERECTOMY     ANTERIOR CERVICAL DECOMP/DISCECTOMY FUSION N/A 01/03/2022   Procedure: Anterior Cervical Decompression Fusion - Cervical four-Cervical  five - Cervical five-Cervical six;  Surgeon: Tia Alert, MD;  Location: [facility] OR;  Service: Neurosurgery;  Laterality: N/A;   BIOPSY  12/16/2020   Procedure: BIOPSY;  Surgeon: Meryl Dare, MD;  Location: [facility] ENDOSCOPY;  Service: Endoscopy;;   CARDIAC CATHETERIZATION N/A 04/18/2015   Procedure: Left Heart Cath and Coronary Angiography;  Surgeon: Rinaldo Cloud, MD;  Location: [facility] INVASIVE CV LAB;  Service: Cardiovascular;  Laterality: N/A;   COLONOSCOPY     COLONOSCOPY W/ BIOPSIES AND POLYPECTOMY  2018   ESOPHAGOGASTRODUODENOSCOPY (EGD) WITH PROPOFOL N/A 12/16/2020   Procedure: ESOPHAGOGASTRODUODENOSCOPY (EGD) WITH PROPOFOL;  Surgeon: Meryl Dare, MD;  Location: [facility] ENDOSCOPY;  Service: Endoscopy;  Laterality: N/A;   FOOT SURGERY Bilateral    [facility] "bunion,bone spur, tendon" (1) -6'16, (1)-10'16   HEMATOMA EVACUATION N/A 01/05/2022   Procedure: Cervical Wound Exploration;  Surgeon: Coletta Memos, MD;  Location: [facility] OR;  Service: Neurosurgery;  Laterality: N/A;   IR EPIDUROGRAPHY  07/21/2018   LUMBAR LAMINECTOMY/DECOMPRESSION MICRODISCECTOMY Bilateral 12/28/2015   Procedure: MICRO LUMBAR DECOMPRESSION L4 - L5 BILATERALLY;  Surgeon: Jene Every, MD;  Location: [facility] ORS;  Service: Orthopedics;  Laterality: Bilateral;   LUMBAR LAMINECTOMY/DECOMPRESSION MICRODISCECTOMY Bilateral  03/04/2018   Procedure: Revision of Microlumbar Decompression Bilateral Lumbar Four-Five;  Surgeon: Jene Every, MD;  Location: [facility] OR;  Service: Orthopedics;  Laterality: Bilateral;  90 mins   SAVORY DILATION N/A 12/16/2020   Procedure: SAVORY DILATION;  Surgeon: Meryl Dare, MD;  Location: [facility] ENDOSCOPY;  Service: Endoscopy;  Laterality: N/A;   SPINAL CORD STIMULATOR INSERTION N/A 09/28/2019   Procedure: THORACIC SPINAL CORD STIMULATOR INSERTION;  Surgeon: Venita Lick, MD;  Location: [facility] OR;  Service: Orthopedics;  Laterality: N/A;  2.5 hrs   SPINAL CORD STIMULATOR REMOVAL N/A 05/27/2021    Procedure: LUMBAR SPINAL CORD STIMULATOR REMOVAL;  Surgeon: Lucy Chris, MD;  Location: [facility] ORS;  Service: Neurosurgery;  Laterality: N/A;   TUBAL LIGATION     WISDOM TOOTH EXTRACTION     WOUND EXPLORATION N/A 03/04/2018   Procedure: EXPLORATION OF LUMBAR DECOMPRESSION WOUND;  Surgeon: Jene Every, MD;  Location: [facility] OR;  Service: Orthopedics;  Laterality: N/A;   Patient Active Problem List   Diagnosis Date Noted   Frequent falls 04/14/2023   Gastroesophageal reflux disease 04/14/2023   Right upper extremity numbness 09/29/2022   Stage 3a chronic kidney disease (HCC) 09/29/2022   Cervical spondylosis with radiculopathy 01/05/2022   S/P cervical spinal fusion 01/03/2022   Cervical disc disorder with radiculopathy of cervical region 11/27/2021   Uncontrolled pain 05/27/2021   Syncope 05/13/2021   Depression with anxiety 05/13/2021   Chronic diastolic CHF (congestive heart failure) (HCC) 05/13/2021   Chronic back pain 05/13/2021   Chest tightness 05/13/2021   Lower abdominal pain 05/13/2021   Syncope and collapse 01/01/2021   Abnormal echocardiogram 01/01/2021   BPPV (benign paroxysmal positional vertigo), left 12/26/2020   Odynophagia    Dysphagia 12/15/2020   Postural dizziness with presyncope 12/14/2020   Lumbar post-laminectomy syndrome    Chronic pain 09/28/2019   Lumbar spinal stenosis    Radicular pain    Gait disorder    Difficulty in urination 07/13/2018   Back pain 07/12/2018   Hyperlipidemia 04/16/2018   GAD (generalized anxiety disorder) 04/16/2018   AKI (acute kidney injury) (HCC)    Benign essential HTN    Major depression, recurrent (HCC)    Lumbar radiculopathy 03/09/2018   Paraparesis (HCC) 03/09/2018   Essential hypertension    Chronic nonintractable headache    Leukocytosis    Acute blood loss anemia    Postoperative pain    Generalized weakness    Spinal stenosis at L4-L5 level 03/04/2018   Spinal stenosis of lumbar region 12/28/2015   Chest pain  04/16/2015    ONSET DATE: Referral date 04-01-23  REFERRING DIAG: M54.16 (ICD-10-CM) - Lumbar radiculopathy  THERAPY DIAG:  Muscle weakness (generalized)  Other abnormalities of gait and mobility  Rationale for Evaluation and Treatment: Rehabilitation  SUBJECTIVE:  SUBJECTIVE STATEMENT: Pt using rollator at for assistance with ambulation at today's session (not using Lofstrand crutch as she was at eval session); pt reports she knew she needed the walker for safety.  Pt reports she had another fall on Saturday a week ago (June 29) - skinned Lt shin but did not get hurt other than that.  Pt reports she talked to her niece who is a Engineer, civil (consulting) and she agreed that she needs a different brace - currently has a Ottobock Walk On posterior strut Pt accompanied by: self  PERTINENT HISTORY: Frequent falls:  Spinal cord stimulator insertion on 09-28-19; stimulator removal on 05-27-21 with resultant LLE weakness;  decompression and laminectomy on 12/28/15 at L4-L5  with redo decompression surgery on May 9,2019, post-op paraparesis with surgery to remove small hematoma; 07/12/18 fall at work (involved rolling chair) with hospital admission, transfer to CIR with d/c home 08/03/18; HTN, Bil foot surgery; left ventricular hypertrophy, chronic pain syndrome, dyspnea, hypertrophic cardiomyopathy; cervical fusion March 2023;  had carpal tunnel surgery RUE in April 2024:  LVH  PAIN:  05-05-23 Are you having pain?  Pt reports her back "bothers her"- is constant in occurrence; rates pain 4-5/10 intensity; worse with bending over; sleeping on heating pad helps Intensity:  4-5/10  Location:  low back Description: soreness in low back Duration; varies depending on activity or movement being performed - worse with bending over or pulling leg up to  chest Aggravating factors -worse with bending over or pulling leg up to chest Alleviating factor - heating pad helps some; pain medication  PRECAUTIONS: Fall  WEIGHT BEARING RESTRICTIONS: No  FALLS: Has patient fallen in last 6 months? Yes. Number of falls 3  LIVING ENVIRONMENT: Lives with: lives with their spouse Lives in: House/apartment Stairs: Yes: External: 5 steps; on right going up Has following equipment at home: Single point cane, Wheelchair (manual), and Lofstrand crutch  PLOF: Independent with basic ADLs, Independent with household mobility with device, Independent with community mobility with device, and Independent with transfers  PATIENT GOALS: improve strength Rt leg  OBJECTIVE:   DIAGNOSTIC FINDINGS: IMPRESSION: 1. Interval anterior discectomy and fusion from C4 through C6 with improved patency of the spinal canal and only mild residual foraminal narrowing. 2. Stable mild spinal stenosis and mild to moderate left foraminal narrowing at C3-4. 3. Stable mild spinal stenosis and mild foraminal narrowing bilaterally at C6-7. Mild foraminal narrowing bilaterally at C7-T1. 4. No cord deformity or abnormal cord signal.  No acute findings. No recent MRI of lumbar region  LOWER EXTREMITY MMT:    MMT Right Eval Left Eval  Hip flexion 3+   Hip extension    Hip abduction    Hip adduction    Hip internal rotation    Hip external rotation    Knee flexion 3-   Knee extension 3-   Ankle dorsiflexion 2-   Ankle plantarflexion    Ankle inversion    Ankle eversion    (Blank rows = not tested)   Today's Treatment: 05-05-23:  Self Care: educated pt in recommended AFO with anterior cuff/shin guard to provide knee stability, whereas, the one she currently owns has a posterior strut (Ottobock Walk On) and does not provide knee stability with extension - therefore, knee flexes (buckles) in stance due to quad weakness and causes pt to fall; unable to trial Blue Rocker AFO  due to clinic not having a small Rt Blue Rocker AFO in stock - trialed toe off AFO and pt reported  that this brace was much better (felt more stable) than the one she currently has  GAIT: Gait pattern: decreased step length- Right, decreased stance time- Right, decreased hip/knee flexion- Right, and decreased ankle dorsiflexion- Right Distance walked: 26' - from waiting room to 1st treatment room; 97' with toe off AFO with rollator  Assistive device utilized: rollator  Level of assistance: SBA and CGA Comments: Pt has SIGNIFICANT DECREASED Rt quad strength and muscle endurance  THEREx: Pt instructed in HEP for RLE strengthening: Bridging x 5 reps (pt had discomfort with this exercise but was able to not lift hips as high and pain was reduced) Heel slide with min assist 5 reps SLR - small range due to difficulty - RLE - 5 reps In Lt sidelying position - Rt hip abduction with knee extended - 5 reps Hip abduction/adduction in supine (hooklying) position 10 reps RLE only    HEP - Medbridge  Access Code: [email or web address] URL: [email or web address] Date: 05/06/2023 Prepared by: Maebelle Munroe  Exercises - Beginner Bridge  - 1 x daily - 7 x weekly - 1 sets - 10 reps - 3 hold - Supine Heel Slide  - 1 x daily - 7 x weekly - 1 sets - 10 reps - Bent Knee Fallouts  - 1 x daily - 7 x weekly - 1 sets - 10 reps - Sidelying Hip Abduction  - 1 x daily - 7 x weekly - 1 sets - 10 reps - Seated Knee Flexion Extension AROM   - 1 x daily - 7 x weekly - 1 sets - 10 reps   PATIENT EDUCATION: Education details:  educated pt in recommended AFO (blue rocker) but unable to trial this orthotic as clinic does not have a small Rt blue rocker AFO; trialed toe off to assess benefit of more supportive AFO compared to the one she is currently using - one with posterior strut only; pt also instructed in HEP - Medbridge [id number] Person educated: Patient Education method: Explanation, demonstration, handouts   Education comprehension: verbalized understanding, demonstrated understanding  HOME EXERCISE PROGRAM: To be issued  GOALS: Goals reviewed with patient? Yes  SHORT TERM GOALS: Target date: 05-22-23  Evaluate for more supportive AFO for RLE and request order as appropriate. Baseline: pt has Ottobock posterior strut AFO Goal status: INITIAL  2.  Amb. 48' with RW with SBA without requiring seated rest period. Baseline:  Goal status: INITIAL  3.  Perform 5x sit to stand with bil. UE support with pt demonstrating equal weight bearing on each leg. Baseline: 3x with bil. UE support with increased weight bearing on LLE Goal status: INITIAL  4.  Pt will participate in TUG assessment with use of RW. Baseline: too fatigued/weak at eval to complete this test Goal status: INITIAL  5.  Negotiate 4 steps with CGA with use of bil. Hand rails Baseline:  Goal status: INITIAL  6.  Independent in HEP for RLE strengthening.  Baseline:  Goal status: INITIAL    LONG TERM GOALS: Target date: 06-19-23  Amb. 57' with RW with SBA without requiring seated rest period. Baseline:  Goal status: INITIAL  2.  Improve TUG score by at least 4 secs with use of RW for assistance with ambulation Baseline: TBA Goal status: INITIAL  3.  Perform 5x sit to stand transfers with LUE support only to demo improved strength in RLE.  Baseline:  Goal status: INITIAL  4.  Modified independent step negotiation with use of bil. Hand rails using step by  step sequence. Baseline:  Goal status: INITIAL  5.  Obtain new AFO for increased RLE stability to decrease fall risk with gait. Baseline:  Goal status: INITIAL  6.  Independent in updated HEP for RLE strengthening. Baseline:  Goal status: INITIAL  ASSESSMENT:  CLINICAL IMPRESSION: Today's PT session focused on trialing a different AFO to determine if one with anterior cuff would be more beneficial in providing knee stability for increased safety with  gait.  Recommended and preferred AFO for trial was the Right small Blue Rocker but this particular AFO is not in clinic's stock at this time so unable to trial this brace.  Trialed toe AFO on RLE and pt reported increased stability with ability to flex her Rt knee in swing phase of gait more with this brace than with the one she currently has.  Pt reported she liked this orthosis better than hers (an Ottobock Walk on with posterior strut) and feels more stable and secure with this one.  Will request order from Dr. Riley Kill and write LMN to justify need for different AFO, as [facility] was contacted to determine when pt received her current AFO and was told it was in July 2022.  Pt may not qualify for new one as it has not been 5 years.  Remainder of session focused on HEP instruction - pt had difficulty performing heel slides and bridging.  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  Will continue to monitor.  Cont with POC.    OBJECTIVE IMPAIRMENTS: Abnormal gait, decreased balance, decreased coordination, decreased endurance, decreased strength, and impaired tone.   ACTIVITY LIMITATIONS: carrying, bending, squatting, stairs, transfers, and locomotion level  PARTICIPATION LIMITATIONS: cleaning, laundry, shopping, and community activity  PERSONAL FACTORS: Behavior pattern, Past/current experiences, and Time since onset of injury/illness/exacerbation are also affecting patient's functional outcome.   REHAB POTENTIAL: Good  CLINICAL DECISION MAKING: Evolving/moderate complexity  EVALUATION COMPLEXITY: Moderate  PLAN:  PT FREQUENCY: 2x/week  PT DURATION: 8 weeks  PLANNED INTERVENTIONS: Therapeutic exercises, Therapeutic activity,  Neuromuscular re-education, Balance training, Gait training, Patient/Family education, Self Care, Stair training, Orthotic/Fit training, DME instructions, and Aquatic Therapy  PLAN FOR NEXT SESSION:  check HEP for any questions; perform TUG and gait velocity with toe off AFO and then with her AFO to compare scores to help justify need for different AFO   Baylei Siebels, Donavan Burnet, PT 05/06/2023, 11:15 AM

## 2023-05-06 ENCOUNTER — Telehealth: Payer: Self-pay | Admitting: Physical Therapy

## 2023-05-06 ENCOUNTER — Encounter: Payer: Self-pay | Admitting: Physical Therapy

## 2023-05-06 DIAGNOSIS — M6281 Muscle weakness (generalized): Secondary | ICD-10-CM

## 2023-05-06 NOTE — Telephone Encounter (Signed)
An order is in epic for a Blue Rocker AFO. Not sure why she's getting weaker though.   Thanks for your follow up!  Z

## 2023-05-06 NOTE — Addendum Note (Signed)
Addended by: Faith Rogue T on: 05/06/2023 04:41 PM   Modules accepted: Orders

## 2023-05-06 NOTE — Telephone Encounter (Signed)
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  Her Rt leg is weaker now than it was during previous OP admission (Sept. 2022 - March 2023).  She would benefit from a different AFO to provide Rt knee stability and support. I am thinking a Blue Rocker AFO, however, the clinic does not have a small Rt one that I can trial with her.  I did try a toe off AFO with her and it was beneficial so I am thinking Blue Rocker to provide even more stability as it appears that her Rt quad is buckling with her current AFO and causing her to fall.   She sustained another fall on Saturday, June 29.  She just obtained her current AFO in July 2022 so I'm not sure her insurance will authorize another one but I would like to try.  If you agree would you please place an order in Epic for a Rt AFO (Blue Rocker) and document in your notes the need for this different orthosis, and I will write a LMN in addition to my clinic notes and hopefully get insurance to approve it.  Thank you so much, Kerry Fort PT

## 2023-05-07 ENCOUNTER — Ambulatory Visit: Payer: PPO | Admitting: Physical Therapy

## 2023-05-07 ENCOUNTER — Encounter: Payer: Self-pay | Admitting: Physical Therapy

## 2023-05-07 DIAGNOSIS — M6281 Muscle weakness (generalized): Secondary | ICD-10-CM | POA: Diagnosis not present

## 2023-05-07 DIAGNOSIS — R2689 Other abnormalities of gait and mobility: Secondary | ICD-10-CM

## 2023-05-07 NOTE — Therapy (Signed)
OUTPATIENT PHYSICAL THERAPY NEURO TREATMENT NOTE   Patient Name: [name] MRN: [id number] DOB:[birth year], 62 y.o., female Today's Date: 05/07/2023   PCP: [place], [name]., MD REFERRING PROVIDER: Ranelle Oyster, MD  END OF SESSION:  PT End of Session - 05/07/23 1347     Visit Number 3    Number of Visits 17    Date for PT Re-Evaluation 06/19/23    Authorization Type [employer]    Authorization Time Period 04-21-23 - 06-27-23    Progress Note Due on Visit 10    PT Start Time 1101    PT Stop Time 1145    PT Time Calculation (min) 44 min    Equipment Utilized During Treatment Gait belt;Other (comment)   Rt toe off AFO   Activity Tolerance Patient tolerated treatment well   limited by RLE weakness   Behavior During Therapy [facility] for tasks assessed/performed               Past Medical History:  Diagnosis Date   Anxiety    Aortic atherosclerosis (HCC) 04/16/2021   Atypical angina    Back pain    related to spinal stenosis and disc problem, radiates down left buttocks to leg., weakness occ.   Chest pain    a. 03/2015 Cath: nl cors; b. 03/2021 Cor CTA: Ca2+ = 0. Nl Cors.   Chronic pain syndrome    Dyspnea    GERD (gastroesophageal reflux disease)    Grade I diastolic dysfunction    Headache    Hyperlipidemia    Hypertension    Lumbar post-laminectomy syndrome    LVH (left ventricular hypertrophy) 12/15/2020   a. 11/2020 Echo: EF 65-70%, no rwma, sev asymm LVH with IVSd 1.9 cm. No LVOT obs @ rest. Gr1 DD. Triv MR.   PONV (postoperative nausea and vomiting)    Pulmonary nodules    a. 03/2021 CT Chest: 3mm pulm nodules in bilat lower lobes. F/u 1 yr.   Right foot drop    Syncope    a. 11/2020 Zio: No significant arrhythmias.   Vaginal foreign object    "Uses Femring"   Past Surgical History:  Procedure Laterality Date   ABDOMINAL HYSTERECTOMY     ANTERIOR CERVICAL DECOMP/DISCECTOMY FUSION N/A 01/03/2022   Procedure: Anterior Cervical Decompression  Fusion - Cervical four-Cervical five - Cervical five-Cervical six;  Surgeon: Tia Alert, MD;  Location: [facility] OR;  Service: Neurosurgery;  Laterality: N/A;   BIOPSY  12/16/2020   Procedure: BIOPSY;  Surgeon: Meryl Dare, MD;  Location: [facility] ENDOSCOPY;  Service: Endoscopy;;   CARDIAC CATHETERIZATION N/A 04/18/2015   Procedure: Left Heart Cath and Coronary Angiography;  Surgeon: Rinaldo Cloud, MD;  Location: [facility] INVASIVE CV LAB;  Service: Cardiovascular;  Laterality: N/A;   COLONOSCOPY     COLONOSCOPY W/ BIOPSIES AND POLYPECTOMY  2018   ESOPHAGOGASTRODUODENOSCOPY (EGD) WITH PROPOFOL N/A 12/16/2020   Procedure: ESOPHAGOGASTRODUODENOSCOPY (EGD) WITH PROPOFOL;  Surgeon: Meryl Dare, MD;  Location: [facility] ENDOSCOPY;  Service: Endoscopy;  Laterality: N/A;   FOOT SURGERY Bilateral    [facility] "bunion,bone spur, tendon" (1) -6'16, (1)-10'16   HEMATOMA EVACUATION N/A 01/05/2022   Procedure: Cervical Wound Exploration;  Surgeon: Coletta Memos, MD;  Location: [facility] OR;  Service: Neurosurgery;  Laterality: N/A;   IR EPIDUROGRAPHY  07/21/2018   LUMBAR LAMINECTOMY/DECOMPRESSION MICRODISCECTOMY Bilateral 12/28/2015   Procedure: MICRO LUMBAR DECOMPRESSION L4 - L5 BILATERALLY;  Surgeon: Jene Every, MD;  Location: [facility] ORS;  Service: Orthopedics;  Laterality:  Bilateral;   LUMBAR LAMINECTOMY/DECOMPRESSION MICRODISCECTOMY Bilateral 03/04/2018   Procedure: Revision of Microlumbar Decompression Bilateral Lumbar Four-Five;  Surgeon: Jene Every, MD;  Location: [facility] OR;  Service: Orthopedics;  Laterality: Bilateral;  90 mins   SAVORY DILATION N/A 12/16/2020   Procedure: SAVORY DILATION;  Surgeon: Meryl Dare, MD;  Location: [facility] ENDOSCOPY;  Service: Endoscopy;  Laterality: N/A;   SPINAL CORD STIMULATOR INSERTION N/A 09/28/2019   Procedure: THORACIC SPINAL CORD STIMULATOR INSERTION;  Surgeon: Venita Lick, MD;  Location: [facility] OR;  Service: Orthopedics;  Laterality: N/A;  2.5 hrs   SPINAL CORD STIMULATOR  REMOVAL N/A 05/27/2021   Procedure: LUMBAR SPINAL CORD STIMULATOR REMOVAL;  Surgeon: Lucy Chris, MD;  Location: [facility] ORS;  Service: Neurosurgery;  Laterality: N/A;   TUBAL LIGATION     WISDOM TOOTH EXTRACTION     WOUND EXPLORATION N/A 03/04/2018   Procedure: EXPLORATION OF LUMBAR DECOMPRESSION WOUND;  Surgeon: Jene Every, MD;  Location: [facility] OR;  Service: Orthopedics;  Laterality: N/A;   Patient Active Problem List   Diagnosis Date Noted   Frequent falls 04/14/2023   Gastroesophageal reflux disease 04/14/2023   Right upper extremity numbness 09/29/2022   Stage 3a chronic kidney disease (HCC) 09/29/2022   Cervical spondylosis with radiculopathy 01/05/2022   S/P cervical spinal fusion 01/03/2022   Cervical disc disorder with radiculopathy of cervical region 11/27/2021   Uncontrolled pain 05/27/2021   Syncope 05/13/2021   Depression with anxiety 05/13/2021   Chronic diastolic CHF (congestive heart failure) (HCC) 05/13/2021   Chronic back pain 05/13/2021   Chest tightness 05/13/2021   Lower abdominal pain 05/13/2021   Syncope and collapse 01/01/2021   Abnormal echocardiogram 01/01/2021   BPPV (benign paroxysmal positional vertigo), left 12/26/2020   Odynophagia    Dysphagia 12/15/2020   Postural dizziness with presyncope 12/14/2020   Lumbar post-laminectomy syndrome    Chronic pain 09/28/2019   Lumbar spinal stenosis    Radicular pain    Gait disorder    Difficulty in urination 07/13/2018   Back pain 07/12/2018   Hyperlipidemia 04/16/2018   GAD (generalized anxiety disorder) 04/16/2018   AKI (acute kidney injury) (HCC)    Benign essential HTN    Major depression, recurrent (HCC)    Lumbar radiculopathy 03/09/2018   Paraparesis (HCC) 03/09/2018   Essential hypertension    Chronic nonintractable headache    Leukocytosis    Acute blood loss anemia    Postoperative pain    Generalized weakness    Spinal stenosis at L4-L5 level 03/04/2018   Spinal stenosis of lumbar  region 12/28/2015   Chest pain 04/16/2015    ONSET DATE: Referral date 04-01-23  REFERRING DIAG: M54.16 (ICD-10-CM) - Lumbar radiculopathy  THERAPY DIAG:  Muscle weakness (generalized)  Other abnormalities of gait and mobility  Rationale for Evaluation and Treatment: Rehabilitation  SUBJECTIVE:  SUBJECTIVE STATEMENT: Pt using rollator again today - reports she went with her husband yesterday to his doctor's appt and took her Loftstrand crutch and almost fell due to Rt leg giving out:  pt reports a gentleman was close by and assisted her, prevented her from falling; pt reports her back is a little sore because she did some exercises before coming to today's PT appt. - rates intensity 4/10.  Pt reports she is not doing very well today because she is upset/concerned about her husband's currrent medical issue  Pt accompanied by: self  PERTINENT HISTORY: Frequent falls:  Spinal cord stimulator insertion on 09-28-19; stimulator removal on 05-27-21 with resultant LLE weakness;  decompression and laminectomy on 12/28/15 at L4-L5  with redo decompression surgery on May 9,2019, post-op paraparesis with surgery to remove small hematoma; 07/12/18 fall at work (involved rolling chair) with hospital admission, transfer to CIR with d/c home 08/03/18; HTN, Bil foot surgery; left ventricular hypertrophy, chronic pain syndrome, dyspnea, hypertrophic cardiomyopathy; cervical fusion March 2023;  had carpal tunnel surgery RUE in April 2024:  LVH  PAIN:  05-07-23 Are you having pain?  Pt reports her back "bothers her"- is constant in occurrence; rates pain 4-5/10 intensity; worse with bending over; sleeping on heating pad helps Intensity:  4/10  Location:  low back Description: soreness in low back Duration; varies depending on activity  or movement being performed - worse with bending over or pulling leg up to chest Aggravating factors -worse with bending over or pulling leg up to chest Alleviating factor - heating pad helps some; pain medication  PRECAUTIONS: Fall  WEIGHT BEARING RESTRICTIONS: No  FALLS: Has patient fallen in last 6 months? Yes. Number of falls 3  LIVING ENVIRONMENT: Lives with: lives with their spouse Lives in: House/apartment Stairs: Yes: External: 5 steps; on right going up Has following equipment at home: Single point cane, Wheelchair (manual), and Lofstrand crutch  PLOF: Independent with basic ADLs, Independent with household mobility with device, Independent with community mobility with device, and Independent with transfers  PATIENT GOALS: improve strength Rt leg  OBJECTIVE:   DIAGNOSTIC FINDINGS: IMPRESSION: 1. Interval anterior discectomy and fusion from C4 through C6 with improved patency of the spinal canal and only mild residual foraminal narrowing. 2. Stable mild spinal stenosis and mild to moderate left foraminal narrowing at C3-4. 3. Stable mild spinal stenosis and mild foraminal narrowing bilaterally at C6-7. Mild foraminal narrowing bilaterally at C7-T1. 4. No cord deformity or abnormal cord signal.  No acute findings. No recent MRI of lumbar region  LOWER EXTREMITY MMT:    MMT Right Eval Left Eval  Hip flexion 3+   Hip extension    Hip abduction    Hip adduction    Hip internal rotation    Hip external rotation    Knee flexion 3-   Knee extension 3-   Ankle dorsiflexion 2-   Ankle plantarflexion    Ankle inversion    Ankle eversion    (Blank rows = not tested)   Today's Treatment: 05-07-23  Informed pt that Dr. Riley Kill responded to request for order for [facility] Rocker AFO - will write LMN in addition to his notes and PT clinical notes to justify need for new orthosis as it has only been 2 years since her current Ottobock AFO was obtained  In justification for  need for Blue Rocker - TUG and gait velocity were assessed with each brace for comparison  NEURORE-ED:       TUG  score:  1"22 secs with Ottobock walk on brace on RLE - with use of rollator    46 secs with Rt toe off AFO - with use of rollator  GAIT: Gait pattern: decreased step length- Right, decreased stance time- Right, decreased hip/knee flexion- Right, and decreased ankle dorsiflexion- Right Distance walked: approx. 83' from lobby to high low mat table - CGA for safety due to Rt knee instability;  changed AFO from her Ottobock Walk on to toe off AFO for TUG and gait velocity assessments Assistive device utilized: rollator  Level of assistance: SBA and CGA Comments: Pt has SIGNIFICANT DECREASED Rt quad strength and muscle endurance  Gait velocity: with Ottobock Walk on AFO - 1"18 secs with rollator = .42 ft/sec   With Toe off AFO 31.9 secs with rollator = 1.03 ft/sec  Walked with pt from // bars to car - approx. 200' with pt wearing toe off AFO - for increased stability RLE and for pt to trial AFO on uneven (paved surfaces) in parking lot; pt able to amb. To car without requiring seated rest period - CGA to SBA only  THEREX:   Pt performed tap ups to 4" step with LLE for RLE strengthening in closed chain position - 2 sets 5 reps - with UE support on rollator  Pt performed sit to stand transfer inside // bars with Lt foot on 2" step for increased RLE weight bearing and strengthening in closed chain position - performed  2 sets 5 reps Attempted sit to stand with Lt foot on 4" step initially from mat - pt unable to perform due to RLE weakness   2" step used - pt performed step ups with RLE 2 reps with lifting Rt foot onto 2" step and then keeping Rt foot on step for remaining 8 reps due to difficulty with hip flexion in standing - cues to flex Rt knee prior to descending step, back to floor  Progressed to using 4" step for RLE step ups inside // bars 1 set 5 reps - Rt foot remained on  step during entire set due to difficulty actively flexing hip and lifting foot onto step - cues to flex Rt knee for increased eccentric control with step descension - bil. UE support on // bars    HEP - Medbridge  Access Code: [email or web address] URL: [email or web address] Date: 05/06/2023 Prepared by: Maebelle Munroe  Exercises - Beginner Bridge  - 1 x daily - 7 x weekly - 1 sets - 10 reps - 3 hold - Supine Heel Slide  - 1 x daily - 7 x weekly - 1 sets - 10 reps - Bent Knee Fallouts  - 1 x daily - 7 x weekly - 1 sets - 10 reps - Sidelying Hip Abduction  - 1 x daily - 7 x weekly - 1 sets - 10 reps - Seated Knee Flexion Extension AROM   - 1 x daily - 7 x weekly - 1 sets - 10 reps   PATIENT EDUCATION: Education details:  educated pt in recommended AFO (blue rocker) but unable to trial this orthotic as clinic does not have a small Rt blue rocker AFO; trialed toe off to assess benefit of more supportive AFO compared to the one she is currently using - one with posterior strut only; pt also instructed in HEP - Medbridge [id number] Person educated: Patient Education method: Explanation, demonstration, handouts  Education comprehension: verbalized understanding, demonstrated understanding  HOME EXERCISE PROGRAM: To be issued  GOALS: Goals reviewed  with patient? Yes  SHORT TERM GOALS: Target date: 05-22-23  Evaluate for more supportive AFO for RLE and request order as appropriate. Baseline: pt has Ottobock posterior strut AFO Goal status: INITIAL  2.  Amb. 3' with RW with SBA without requiring seated rest period. Baseline:  Goal status: INITIAL  3.  Perform 5x sit to stand with bil. UE support with pt demonstrating equal weight bearing on each leg. Baseline: 3x with bil. UE support with increased weight bearing on LLE Goal status: INITIAL  4.  Pt will participate in TUG assessment with use of RW. Baseline: too fatigued/weak at eval to complete this test Goal status: INITIAL  5.   Negotiate 4 steps with CGA with use of bil. Hand rails Baseline:  Goal status: INITIAL  6.  Independent in HEP for RLE strengthening.  Baseline:  Goal status: INITIAL    LONG TERM GOALS: Target date: 06-19-23  Amb. 63' with RW with SBA without requiring seated rest period. Baseline:  Goal status: INITIAL  2.  Improve TUG score by at least 4 secs with use of RW for assistance with ambulation Baseline: TBA Goal status: INITIAL  3.  Perform 5x sit to stand transfers with LUE support only to demo improved strength in RLE.  Baseline:  Goal status: INITIAL  4.  Modified independent step negotiation with use of bil. Hand rails using step by step sequence. Baseline:  Goal status: INITIAL  5.  Obtain new AFO for increased RLE stability to decrease fall risk with gait. Baseline:  Goal status: INITIAL  6.  Independent in updated HEP for RLE strengthening. Baseline:  Goal status: INITIAL  ASSESSMENT:  CLINICAL IMPRESSION: PT session focused on comparison of gait assessments with pt wearing her Ottobock AFO (posterior strut without knee support) and with use of Toe Off AFO on RLE.  Blue Rocker AFO unable to be trialed due to not in clinic's stock of trial orthoses.  Pt's TUG score 1" 22 secs with her Ottobock AFO with rollator compared to 46 secs with use of Rt toe off AFO with use of rollator.  Pt's gait velocity significantly increased with use of Rt toe off AFO at 1.03 ft/sec with rollator compared to .42 ft/sec with use of toe off AFO with use of rollator.  Remainder of session focused on RLE closed chain strengthening. Pt able to flex hip to lift RLE onto 2" for 2 reps; did not attempt lifting Rt foot onto 4" step due to difficulty performing this movement onto 2" step.  Pt able to amb. To car at end of session with use of toe off AFO on RLE without requiring seated rest period.  Pt reported feeling much more stable and confident with less fear of falling with toe off AFO on RLE  compared to how she feels and ambulates with use of her Ottobock AFO, due to lack of knee stability provided with her orthosis.  Cont with POC.    OBJECTIVE IMPAIRMENTS: Abnormal gait, decreased balance, decreased coordination, decreased endurance, decreased strength, and impaired tone.   ACTIVITY LIMITATIONS: carrying, bending, squatting, stairs, transfers, and locomotion level  PARTICIPATION LIMITATIONS: cleaning, laundry, shopping, and community activity  PERSONAL FACTORS: Behavior pattern, Past/current experiences, and Time since onset of injury/illness/exacerbation are also affecting patient's functional outcome.   REHAB POTENTIAL: Good  CLINICAL DECISION MAKING: Evolving/moderate complexity  EVALUATION COMPLEXITY: Moderate  PLAN:  PT FREQUENCY: 2x/week  PT DURATION: 8 weeks  PLANNED INTERVENTIONS: Therapeutic exercises, Therapeutic activity, Neuromuscular re-education, Balance training,  Gait training, Patient/Family education, Self Care, Stair training, Orthotic/Fit training, DME instructions, and Aquatic Therapy  PLAN FOR NEXT SESSION:  check HEP for any questions; perform TUG and gait velocity with toe off AFO and then with her AFO to compare scores to help justify need for different AFO   Venisha Boehning, Donavan Burnet, PT 05/07/2023, 4:24 PM

## 2023-05-11 ENCOUNTER — Other Ambulatory Visit (HOSPITAL_COMMUNITY): Payer: Self-pay

## 2023-05-12 ENCOUNTER — Ambulatory Visit: Payer: PPO | Admitting: Physical Therapy

## 2023-05-12 DIAGNOSIS — M6281 Muscle weakness (generalized): Secondary | ICD-10-CM | POA: Diagnosis not present

## 2023-05-12 DIAGNOSIS — R2689 Other abnormalities of gait and mobility: Secondary | ICD-10-CM

## 2023-05-12 NOTE — Therapy (Unsigned)
OUTPATIENT PHYSICAL THERAPY NEURO TREATMENT NOTE   Patient Name: [name] MRN: [id number] DOB:09-21-1961, 62 y.o., female Today's Date: 05/13/2023   PCP: [place], [name]., MD REFERRING PROVIDER: Ranelle Oyster, MD  END OF SESSION:  PT End of Session - 05/13/23 1705     Visit Number 4    Number of Visits 17    Date for PT Re-Evaluation 06/19/23    Authorization Type [employer]    Authorization Time Period 04-21-23 - 06-27-23    Progress Note Due on Visit 10    PT Start Time 0936    PT Stop Time 1017    PT Time Calculation (min) 41 min    Equipment Utilized During Treatment Gait belt;Other (comment)   Rt toe off AFO   Activity Tolerance Patient tolerated treatment well   limited by RLE weakness   Behavior During Therapy [facility] for tasks assessed/performed                Past Medical History:  Diagnosis Date   Anxiety    Aortic atherosclerosis (HCC) 04/16/2021   Atypical angina    Back pain    related to spinal stenosis and disc problem, radiates down left buttocks to leg., weakness occ.   Chest pain    a. 03/2015 Cath: nl cors; b. 03/2021 Cor CTA: Ca2+ = 0. Nl Cors.   Chronic pain syndrome    Dyspnea    GERD (gastroesophageal reflux disease)    Grade I diastolic dysfunction    Headache    Hyperlipidemia    Hypertension    Lumbar post-laminectomy syndrome    LVH (left ventricular hypertrophy) 12/15/2020   a. 11/2020 Echo: EF 65-70%, no rwma, sev asymm LVH with IVSd 1.9 cm. No LVOT obs @ rest. Gr1 DD. Triv MR.   PONV (postoperative nausea and vomiting)    Pulmonary nodules    a. 03/2021 CT Chest: 3mm pulm nodules in bilat lower lobes. F/u 1 yr.   Right foot drop    Syncope    a. 11/2020 Zio: No significant arrhythmias.   Vaginal foreign object    "Uses Femring"   Past Surgical History:  Procedure Laterality Date   ABDOMINAL HYSTERECTOMY     ANTERIOR CERVICAL DECOMP/DISCECTOMY FUSION N/A 01/03/2022   Procedure: Anterior Cervical Decompression  Fusion - Cervical four-Cervical five - Cervical five-Cervical six;  Surgeon: Tia Alert, MD;  Location: [facility] OR;  Service: Neurosurgery;  Laterality: N/A;   BIOPSY  12/16/2020   Procedure: BIOPSY;  Surgeon: Meryl Dare, MD;  Location: [facility] ENDOSCOPY;  Service: Endoscopy;;   CARDIAC CATHETERIZATION N/A 04/18/2015   Procedure: Left Heart Cath and Coronary Angiography;  Surgeon: Rinaldo Cloud, MD;  Location: [facility] INVASIVE CV LAB;  Service: Cardiovascular;  Laterality: N/A;   COLONOSCOPY     COLONOSCOPY W/ BIOPSIES AND POLYPECTOMY  2018   ESOPHAGOGASTRODUODENOSCOPY (EGD) WITH PROPOFOL N/A 12/16/2020   Procedure: ESOPHAGOGASTRODUODENOSCOPY (EGD) WITH PROPOFOL;  Surgeon: Meryl Dare, MD;  Location: [facility] ENDOSCOPY;  Service: Endoscopy;  Laterality: N/A;   FOOT SURGERY Bilateral    [facility] "bunion,bone spur, tendon" (1) -6'16, (1)-10'16   HEMATOMA EVACUATION N/A 01/05/2022   Procedure: Cervical Wound Exploration;  Surgeon: Coletta Memos, MD;  Location: [facility] OR;  Service: Neurosurgery;  Laterality: N/A;   IR EPIDUROGRAPHY  07/21/2018   LUMBAR LAMINECTOMY/DECOMPRESSION MICRODISCECTOMY Bilateral 12/28/2015   Procedure: MICRO LUMBAR DECOMPRESSION L4 - L5 BILATERALLY;  Surgeon: Jene Every, MD;  Location: [facility] ORS;  Service: Orthopedics;  Laterality: Bilateral;   LUMBAR LAMINECTOMY/DECOMPRESSION MICRODISCECTOMY Bilateral 03/04/2018   Procedure: Revision of Microlumbar Decompression Bilateral Lumbar Four-Five;  Surgeon: Jene Every, MD;  Location: [facility] OR;  Service: Orthopedics;  Laterality: Bilateral;  90 mins   SAVORY DILATION N/A 12/16/2020   Procedure: SAVORY DILATION;  Surgeon: Meryl Dare, MD;  Location: [facility] ENDOSCOPY;  Service: Endoscopy;  Laterality: N/A;   SPINAL CORD STIMULATOR INSERTION N/A 09/28/2019   Procedure: THORACIC SPINAL CORD STIMULATOR INSERTION;  Surgeon: Venita Lick, MD;  Location: [facility] OR;  Service: Orthopedics;  Laterality: N/A;  2.5 hrs   SPINAL CORD STIMULATOR  REMOVAL N/A 05/27/2021   Procedure: LUMBAR SPINAL CORD STIMULATOR REMOVAL;  Surgeon: Lucy Chris, MD;  Location: [facility] ORS;  Service: Neurosurgery;  Laterality: N/A;   TUBAL LIGATION     WISDOM TOOTH EXTRACTION     WOUND EXPLORATION N/A 03/04/2018   Procedure: EXPLORATION OF LUMBAR DECOMPRESSION WOUND;  Surgeon: Jene Every, MD;  Location: [facility] OR;  Service: Orthopedics;  Laterality: N/A;   Patient Active Problem List   Diagnosis Date Noted   Frequent falls 04/14/2023   Gastroesophageal reflux disease 04/14/2023   Right upper extremity numbness 09/29/2022   Stage 3a chronic kidney disease (HCC) 09/29/2022   Cervical spondylosis with radiculopathy 01/05/2022   S/P cervical spinal fusion 01/03/2022   Cervical disc disorder with radiculopathy of cervical region 11/27/2021   Uncontrolled pain 05/27/2021   Syncope 05/13/2021   Depression with anxiety 05/13/2021   Chronic diastolic CHF (congestive heart failure) (HCC) 05/13/2021   Chronic back pain 05/13/2021   Chest tightness 05/13/2021   Lower abdominal pain 05/13/2021   Syncope and collapse 01/01/2021   Abnormal echocardiogram 01/01/2021   BPPV (benign paroxysmal positional vertigo), left 12/26/2020   Odynophagia    Dysphagia 12/15/2020   Postural dizziness with presyncope 12/14/2020   Lumbar post-laminectomy syndrome    Chronic pain 09/28/2019   Lumbar spinal stenosis    Radicular pain    Gait disorder    Difficulty in urination 07/13/2018   Back pain 07/12/2018   Hyperlipidemia 04/16/2018   GAD (generalized anxiety disorder) 04/16/2018   AKI (acute kidney injury) (HCC)    Benign essential HTN    Major depression, recurrent (HCC)    Lumbar radiculopathy 03/09/2018   Paraparesis (HCC) 03/09/2018   Essential hypertension    Chronic nonintractable headache    Leukocytosis    Acute blood loss anemia    Postoperative pain    Generalized weakness    Spinal stenosis at L4-L5 level 03/04/2018   Spinal stenosis of lumbar  region 12/28/2015   Chest pain 04/16/2015    ONSET DATE: Referral date 04-01-23  REFERRING DIAG: M54.16 (ICD-10-CM) - Lumbar radiculopathy  THERAPY DIAG:  Muscle weakness (generalized)  Other abnormalities of gait and mobility  Rationale for Evaluation and Treatment: Rehabilitation  SUBJECTIVE:  SUBJECTIVE STATEMENT: Pt reports no falls since previous PT session last week. States she went to church on Sunday - husband let her out at the door. Pt reports she was able to stand up during the service without a lot of difficulty and participate in praise activity Pt accompanied by: self  PERTINENT HISTORY: Frequent falls:  Spinal cord stimulator insertion on 09-28-19; stimulator removal on 05-27-21 with resultant LLE weakness;  decompression and laminectomy on 12/28/15 at L4-L5  with redo decompression surgery on May 9,2019, post-op paraparesis with surgery to remove small hematoma; 07/12/18 fall at work (involved rolling chair) with hospital admission, transfer to CIR with d/c home 08/03/18; HTN, Bil foot surgery; left ventricular hypertrophy, chronic pain syndrome, dyspnea, hypertrophic cardiomyopathy; cervical fusion March 2023;  had carpal tunnel surgery RUE in April 2024:  LVH  PAIN:  05-12-23 Are you having pain?  Pt reports her back "bothers her"- is constant in occurrence; rates pain 4-5/10 intensity; worse with bending over; sleeping on heating pad helps Intensity:  4/10  Location:  low back Description: soreness in low back Duration; varies depending on activity or movement being performed - worse with bending over or pulling leg up to chest Aggravating factors -worse with bending over or pulling leg up to chest Alleviating factor - heating pad helps some; pain medication  PRECAUTIONS: Fall  WEIGHT  BEARING RESTRICTIONS: No  FALLS: Has patient fallen in last 6 months? Yes. Number of falls 3  LIVING ENVIRONMENT: Lives with: lives with their spouse Lives in: House/apartment Stairs: Yes: External: 5 steps; on right going up Has following equipment at home: Single point cane, Wheelchair (manual), and Lofstrand crutch  PLOF: Independent with basic ADLs, Independent with household mobility with device, Independent with community mobility with device, and Independent with transfers  PATIENT GOALS: improve strength Rt leg  OBJECTIVE:   DIAGNOSTIC FINDINGS: IMPRESSION: 1. Interval anterior discectomy and fusion from C4 through C6 with improved patency of the spinal canal and only mild residual foraminal narrowing. 2. Stable mild spinal stenosis and mild to moderate left foraminal narrowing at C3-4. 3. Stable mild spinal stenosis and mild foraminal narrowing bilaterally at C6-7. Mild foraminal narrowing bilaterally at C7-T1. 4. No cord deformity or abnormal cord signal.  No acute findings. No recent MRI of lumbar region  LOWER EXTREMITY MMT:    MMT Right Eval Left Eval  Hip flexion 3+   Hip extension    Hip abduction    Hip adduction    Hip internal rotation    Hip external rotation    Knee flexion 3-   Knee extension 3-   Ankle dorsiflexion 2-   Ankle plantarflexion    Ankle inversion    Ankle eversion    (Blank rows = not tested)   Today's Treatment: 05-12-23    GAIT: Gait pattern: decreased step length- Right, decreased stance time- Right, decreased hip/knee flexion- Right, and decreased ankle dorsiflexion- Right Distance walked: approx. 71' from lobby to high low mat table - SBA:   changed AFO from her Ottobock Walk on to toe off AFO for gait training in clinic Assistive device utilized: rollator  Level of assistance: SBA and CGA Comments: Pt has SIGNIFICANT DECREASED Rt quad strength and muscle endurance; used Rt toe off AFO during PT session on 05-12-23    05-07-23: Gait velocity: with Ottobock Walk on AFO - 1"18 secs with rollator = .42 ft/sec   With Toe off AFO 31.9 secs with rollator = 1.03 ft/sec   THEREX:  Pt performed tap ups onto 1st step (6") with LLE 10 reps for RLE closed chain strengthening with isometric contraction  Progressed to tapping 2nd step with LLE 5 reps with bil. UE support on hand rails  Pt performed tap ups to 4" step with LLE for RLE strengthening in closed chain position - 4" step placed inside // bars - pt used bil. UE support on bars; kept Rt foot on step during entire exercise due to difficulty lifting Rt leg onto step due to hip and knee flexor weakness  Pt performed tap ups onto 2" blue foam with RLE 5 reps - 2 sets with bil. UE support   Pt performed squats bil. LE's inside // bars 7 reps with bil. UE support -  reported RLE fatigued after 7 reps so exercise was discontinued and pt had seated rest period  Leg press 25# RLE 10 reps  Seated Rt LAQ - no resistance - 10 reps with approx. 3 sec hold  HEP - Medbridge  Access Code: [email or web address] URL: [email or web address] Date: 05/06/2023 Prepared by: Maebelle Munroe  Exercises - Beginner Bridge  - 1 x daily - 7 x weekly - 1 sets - 10 reps - 3 hold - Supine Heel Slide  - 1 x daily - 7 x weekly - 1 sets - 10 reps - Bent Knee Fallouts  - 1 x daily - 7 x weekly - 1 sets - 10 reps - Sidelying Hip Abduction  - 1 x daily - 7 x weekly - 1 sets - 10 reps - Seated Knee Flexion Extension AROM   - 1 x daily - 7 x weekly - 1 sets - 10 reps   PATIENT EDUCATION: Education details:  educated pt in recommended AFO (blue rocker) but unable to trial this orthotic as clinic does not have a small Rt blue rocker AFO; trialed toe off to assess benefit of more supportive AFO compared to the one she is currently using - one with posterior strut only; pt also instructed in HEP - Medbridge [id number] Person educated: Patient Education method: Explanation, demonstration, handouts   Education comprehension: verbalized understanding, demonstrated understanding  HOME EXERCISE PROGRAM: To be issued  GOALS: Goals reviewed with patient? Yes  SHORT TERM GOALS: Target date: 05-22-23  Evaluate for more supportive AFO for RLE and request order as appropriate. Baseline: pt has Ottobock posterior strut AFO Goal status: INITIAL  2.  Amb. 53' with RW with SBA without requiring seated rest period. Baseline:  Goal status: INITIAL  3.  Perform 5x sit to stand with bil. UE support with pt demonstrating equal weight bearing on each leg. Baseline: 3x with bil. UE support with increased weight bearing on LLE Goal status: INITIAL  4.  Pt will participate in TUG assessment with use of RW. Baseline: too fatigued/weak at eval to complete this test Goal status: INITIAL  5.  Negotiate 4 steps with CGA with use of bil. Hand rails Baseline:  Goal status: INITIAL  6.  Independent in HEP for RLE strengthening.  Baseline:  Goal status: INITIAL    LONG TERM GOALS: Target date: 06-19-23  Amb. 26' with RW with SBA without requiring seated rest period. Baseline:  Goal status: INITIAL  2.  Improve TUG score by at least 4 secs with use of RW for assistance with ambulation Baseline: TBA Goal status: INITIAL  3.  Perform 5x sit to stand transfers with LUE support only to demo improved strength in RLE.  Baseline:  Goal status: INITIAL  4.  Modified independent step negotiation with use of bil. Hand rails using step by step sequence. Baseline:  Goal status: INITIAL  5.  Obtain new AFO for increased RLE stability to decrease fall risk with gait. Baseline:  Goal status: INITIAL  6.  Independent in updated HEP for RLE strengthening. Baseline:  Goal status: INITIAL  ASSESSMENT:  CLINICAL IMPRESSION: PT session focused on RLE strengthening, mainly in closed chain position in standing.  Pt able to perform 7 squats bil. LE's with bil. UE support on // bars prior to fatigue.   Able to actively flex Rt hip and knee lift Rt foot up onto 2" step in standing with bil. UE support.  Pt continues to report feeling much more stable with use of Rt toe off AFO with less fear of falling.  Pt also reports much less energy expenditure required with use of this orthosis compared to amount of energy needed with ambulation with her AFO (Ottobock Walk On).  LMN to be completed to justify a more supportive AFO, specifically Blue Rocker.   Cont with POC.    OBJECTIVE IMPAIRMENTS: Abnormal gait, decreased balance, decreased coordination, decreased endurance, decreased strength, and impaired tone.   ACTIVITY LIMITATIONS: carrying, bending, squatting, stairs, transfers, and locomotion level  PARTICIPATION LIMITATIONS: cleaning, laundry, shopping, and community activity  PERSONAL FACTORS: Behavior pattern, Past/current experiences, and Time since onset of injury/illness/exacerbation are also affecting patient's functional outcome.   REHAB POTENTIAL: Good  CLINICAL DECISION MAKING: Evolving/moderate complexity  EVALUATION COMPLEXITY: Moderate  PLAN:  PT FREQUENCY: 2x/week  PT DURATION: 8 weeks  PLANNED INTERVENTIONS: Therapeutic exercises, Therapeutic activity, Neuromuscular re-education, Balance training, Gait training, Patient/Family education, Self Care, Stair training, Orthotic/Fit training, DME instructions, and Aquatic Therapy  PLAN FOR NEXT SESSION:  check HEP for any questions; continue RLE strengthening   Kary Kos, PT 05/13/2023, 5:07 PM

## 2023-05-13 ENCOUNTER — Encounter: Payer: Self-pay | Admitting: Physical Therapy

## 2023-05-16 ENCOUNTER — Other Ambulatory Visit (HOSPITAL_COMMUNITY): Payer: Self-pay

## 2023-05-18 ENCOUNTER — Other Ambulatory Visit: Payer: Self-pay

## 2023-05-18 ENCOUNTER — Other Ambulatory Visit (HOSPITAL_COMMUNITY): Payer: Self-pay

## 2023-05-19 ENCOUNTER — Other Ambulatory Visit: Payer: Self-pay

## 2023-05-26 ENCOUNTER — Ambulatory Visit: Payer: PPO | Admitting: Physical Therapy

## 2023-05-26 DIAGNOSIS — R2681 Unsteadiness on feet: Secondary | ICD-10-CM

## 2023-05-26 DIAGNOSIS — R2689 Other abnormalities of gait and mobility: Secondary | ICD-10-CM

## 2023-05-26 DIAGNOSIS — M6281 Muscle weakness (generalized): Secondary | ICD-10-CM

## 2023-05-26 NOTE — Therapy (Unsigned)
OUTPATIENT PHYSICAL THERAPY NEURO TREATMENT NOTE   Patient Name: [name] MRN: [id number] DOB:[birth year], 62 y.o., female Today's Date: 05/27/2023   PCP: [place], [name]., MD REFERRING PROVIDER: Ranelle Oyster, MD  END OF SESSION:  PT End of Session - 05/27/23 0959     Visit Number 5    Number of Visits 17    Date for PT Re-Evaluation 06/19/23    Authorization Type [employer]    Authorization Time Period 04-21-23 - 06-27-23    Progress Note Due on Visit 10    PT Start Time 0933    PT Stop Time 1015    PT Time Calculation (min) 42 min    Equipment Utilized During Treatment Gait belt;Other (comment)   Rt toe off AFO   Activity Tolerance Patient tolerated treatment well   limited by RLE weakness   Behavior During Therapy [facility] for tasks assessed/performed                 Past Medical History:  Diagnosis Date   Anxiety    Aortic atherosclerosis (HCC) 04/16/2021   Atypical angina    Back pain    related to spinal stenosis and disc problem, radiates down left buttocks to leg., weakness occ.   Chest pain    a. 03/2015 Cath: nl cors; b. 03/2021 Cor CTA: Ca2+ = 0. Nl Cors.   Chronic pain syndrome    Dyspnea    GERD (gastroesophageal reflux disease)    Grade I diastolic dysfunction    Headache    Hyperlipidemia    Hypertension    Lumbar post-laminectomy syndrome    LVH (left ventricular hypertrophy) 12/15/2020   a. 11/2020 Echo: EF 65-70%, no rwma, sev asymm LVH with IVSd 1.9 cm. No LVOT obs @ rest. Gr1 DD. Triv MR.   PONV (postoperative nausea and vomiting)    Pulmonary nodules    a. 03/2021 CT Chest: 3mm pulm nodules in bilat lower lobes. F/u 1 yr.   Right foot drop    Syncope    a. 11/2020 Zio: No significant arrhythmias.   Vaginal foreign object    "Uses Femring"   Past Surgical History:  Procedure Laterality Date   ABDOMINAL HYSTERECTOMY     ANTERIOR CERVICAL DECOMP/DISCECTOMY FUSION N/A 01/03/2022   Procedure: Anterior Cervical  Decompression Fusion - Cervical four-Cervical five - Cervical five-Cervical six;  Surgeon: Tia Alert, MD;  Location: [facility] OR;  Service: Neurosurgery;  Laterality: N/A;   BIOPSY  12/16/2020   Procedure: BIOPSY;  Surgeon: Meryl Dare, MD;  Location: [facility] ENDOSCOPY;  Service: Endoscopy;;   CARDIAC CATHETERIZATION N/A 04/18/2015   Procedure: Left Heart Cath and Coronary Angiography;  Surgeon: Rinaldo Cloud, MD;  Location: [facility] INVASIVE CV LAB;  Service: Cardiovascular;  Laterality: N/A;   COLONOSCOPY     COLONOSCOPY W/ BIOPSIES AND POLYPECTOMY  2018   ESOPHAGOGASTRODUODENOSCOPY (EGD) WITH PROPOFOL N/A 12/16/2020   Procedure: ESOPHAGOGASTRODUODENOSCOPY (EGD) WITH PROPOFOL;  Surgeon: Meryl Dare, MD;  Location: [facility] ENDOSCOPY;  Service: Endoscopy;  Laterality: N/A;   FOOT SURGERY Bilateral    [facility] "bunion,bone spur, tendon" (1) -6'16, (1)-10'16   HEMATOMA EVACUATION N/A 01/05/2022   Procedure: Cervical Wound Exploration;  Surgeon: Coletta Memos, MD;  Location: [facility] OR;  Service: Neurosurgery;  Laterality: N/A;   IR EPIDUROGRAPHY  07/21/2018   LUMBAR LAMINECTOMY/DECOMPRESSION MICRODISCECTOMY Bilateral 12/28/2015   Procedure: MICRO LUMBAR DECOMPRESSION L4 - L5 BILATERALLY;  Surgeon: Jene Every, MD;  Location: [facility] ORS;  Service: Orthopedics;  Laterality: Bilateral;   LUMBAR LAMINECTOMY/DECOMPRESSION MICRODISCECTOMY Bilateral 03/04/2018   Procedure: Revision of Microlumbar Decompression Bilateral Lumbar Four-Five;  Surgeon: Jene Every, MD;  Location: [facility] OR;  Service: Orthopedics;  Laterality: Bilateral;  90 mins   SAVORY DILATION N/A 12/16/2020   Procedure: SAVORY DILATION;  Surgeon: Meryl Dare, MD;  Location: [facility] ENDOSCOPY;  Service: Endoscopy;  Laterality: N/A;   SPINAL CORD STIMULATOR INSERTION N/A 09/28/2019   Procedure: THORACIC SPINAL CORD STIMULATOR INSERTION;  Surgeon: Venita Lick, MD;  Location: [facility] OR;  Service: Orthopedics;  Laterality: N/A;  2.5 hrs   SPINAL  CORD STIMULATOR REMOVAL N/A 05/27/2021   Procedure: LUMBAR SPINAL CORD STIMULATOR REMOVAL;  Surgeon: Lucy Chris, MD;  Location: [facility] ORS;  Service: Neurosurgery;  Laterality: N/A;   TUBAL LIGATION     WISDOM TOOTH EXTRACTION     WOUND EXPLORATION N/A 03/04/2018   Procedure: EXPLORATION OF LUMBAR DECOMPRESSION WOUND;  Surgeon: Jene Every, MD;  Location: [facility] OR;  Service: Orthopedics;  Laterality: N/A;   Patient Active Problem List   Diagnosis Date Noted   Frequent falls 04/14/2023   Gastroesophageal reflux disease 04/14/2023   Right upper extremity numbness 09/29/2022   Stage 3a chronic kidney disease (HCC) 09/29/2022   Cervical spondylosis with radiculopathy 01/05/2022   S/P cervical spinal fusion 01/03/2022   Cervical disc disorder with radiculopathy of cervical region 11/27/2021   Uncontrolled pain 05/27/2021   Syncope 05/13/2021   Depression with anxiety 05/13/2021   Chronic diastolic CHF (congestive heart failure) (HCC) 05/13/2021   Chronic back pain 05/13/2021   Chest tightness 05/13/2021   Lower abdominal pain 05/13/2021   Syncope and collapse 01/01/2021   Abnormal echocardiogram 01/01/2021   BPPV (benign paroxysmal positional vertigo), left 12/26/2020   Odynophagia    Dysphagia 12/15/2020   Postural dizziness with presyncope 12/14/2020   Lumbar post-laminectomy syndrome    Chronic pain 09/28/2019   Lumbar spinal stenosis    Radicular pain    Gait disorder    Difficulty in urination 07/13/2018   Back pain 07/12/2018   Hyperlipidemia 04/16/2018   GAD (generalized anxiety disorder) 04/16/2018   AKI (acute kidney injury) (HCC)    Benign essential HTN    Major depression, recurrent (HCC)    Lumbar radiculopathy 03/09/2018   Paraparesis (HCC) 03/09/2018   Essential hypertension    Chronic nonintractable headache    Leukocytosis    Acute blood loss anemia    Postoperative pain    Generalized weakness    Spinal stenosis at L4-L5 level 03/04/2018   Spinal stenosis  of lumbar region 12/28/2015   Chest pain 04/16/2015    ONSET DATE: Referral date 04-01-23  REFERRING DIAG: M54.16 (ICD-10-CM) - Lumbar radiculopathy  THERAPY DIAG:  Muscle weakness (generalized)  Other abnormalities of gait and mobility  Unsteadiness on feet  Rationale for Evaluation and Treatment: Rehabilitation  SUBJECTIVE:  SUBJECTIVE STATEMENT: Pt had appt at [facility] last Friday - thinks the [facility] will be approved; has appt to go back to [place] on August 23rd Pt accompanied by: self  PERTINENT HISTORY: Frequent falls:  Spinal cord stimulator insertion on 09-28-19; stimulator removal on 05-27-21 with resultant LLE weakness;  decompression and laminectomy on 12/28/15 at L4-L5  with redo decompression surgery on May 9,2019, post-op paraparesis with surgery to remove small hematoma; 07/12/18 fall at work (involved rolling chair) with hospital admission, transfer to CIR with d/c home 08/03/18; HTN, Bil foot surgery; left ventricular hypertrophy, chronic pain syndrome, dyspnea, hypertrophic cardiomyopathy; cervical fusion March 2023;  had carpal tunnel surgery RUE in April 2024:  LVH  PAIN:  05-26-23 Are you having pain?  Pt reports her back "bothers her"- is constant in occurrence; rates pain 4-5/10 intensity; worse with bending over; sleeping on heating pad helps Intensity: 1/10  Location:  low back Description: soreness in low back Duration; varies depending on activity or movement being performed - worse with bending over or pulling leg up to chest Aggravating factors -worse with bending over or pulling leg up to chest Alleviating factor - heating pad helps some; pain medication  PRECAUTIONS: Fall  WEIGHT BEARING RESTRICTIONS: No  FALLS: Has patient fallen in last 6 months? Yes. Number of falls  3  LIVING ENVIRONMENT: Lives with: lives with their spouse Lives in: House/apartment Stairs: Yes: External: 5 steps; on right going up Has following equipment at home: Single point cane, Wheelchair (manual), and L[place]strand crutch  PLOF: Independent with basic ADLs, Independent with household mobility with device, Independent with community mobility with device, and Independent with transfers  PATIENT GOALS: improve strength Rt leg  OBJECTIVE:   DIAGNOSTIC FINDINGS: IMPRESSION: 1. Interval anterior discectomy and fusion from C4 through C6 with improved patency of the spinal canal and only mild residual foraminal narrowing. 2. Stable mild spinal stenosis and mild to moderate left foraminal narrowing at C3-4. 3. Stable mild spinal stenosis and mild foraminal narrowing bilaterally at C6-7. Mild foraminal narrowing bilaterally at C7-T1. 4. No cord deformity or abnormal cord signal.  No acute findings. No recent MRI of lumbar region  LOWER EXTREMITY MMT:    MMT Right Eval Left Eval  Hip flexion 3+   Hip extension    Hip abduction    Hip adduction    Hip internal rotation    Hip external rotation    Knee flexion 3-   Knee extension 3-   Ankle dorsiflexion 2-   Ankle plantarflexion    Ankle inversion    Ankle eversion    (Blank rows = not tested)   Today's Treatment: 05-26-23    GAIT: Gait pattern: decreased step length- Right, decreased stance time- Right, decreased hip/knee flexion- Right, and decreased ankle dorsiflexion- Right Distance walked: approx. 62' from lobby to high low mat table - SBA:   changed AFO from her [place]ock Walk on to toe off AFO for gait training in clinic;  115' with rollator with use of toe off AFO on RLE Assistive device utilized: rollator  Level of assistance: SBA and CGA Comments: Pt has SIGNIFICANT DECREASED Rt quad strength and muscle endurance; used Rt toe off AFO during PT session on 05-12-23  Step training:  pt negotiated 4 steps with use  of bil. Hand rails; ascended steps using step by step sequence and descended steps sideways with LLE leading in descension - bil. UE support on hand rails    05-07-23: Gait velocity: with [place]  Walk on AFO - 1"18 secs with rollator = .42 ft/sec   With Toe off AFO 31.9 secs with rollator = 1.03 ft/sec   THEREX:  Performed 5x sit to stand with bil. UE support from high/low mat table (for STG assessment) - with SBA - rollator in front for safety   Pt amb. From mat table to // bars - approx. 72' with rollator;  Performed step up exercise with RLE with 4" step; Rt foot remained on step for this exercise to avoid excessive fatigue; pt performed 6 reps and was resting in standing position when she suddenly lost balance posteriorly - required max to total assist to prevent fall - chair was obtained and pt rested in seated position  HEP - Medbridge  Access Code: [email or web address] URL: [email or web address] Date: 05/06/2023 Prepared by: Maebelle Munroe  Exercises - Beginner Bridge  - 1 x daily - 7 x weekly - 1 sets - 10 reps - 3 hold - Supine Heel Slide  - 1 x daily - 7 x weekly - 1 sets - 10 reps - Bent Knee Fallouts  - 1 x daily - 7 x weekly - 1 sets - 10 reps - Sidelying Hip Abduction  - 1 x daily - 7 x weekly - 1 sets - 10 reps - Seated Knee Flexion Extension AROM   - 1 x daily - 7 x weekly - 1 sets - 10 reps   PATIENT EDUCATION: Education details:  educated pt in recommended AFO (blue rocker) but unable to trial this orthotic as clinic does not have a small Rt blue rocker AFO; trialed toe off to assess benefit of more supportive AFO compared to the one she is currently using - one with posterior strut only; pt also instructed in HEP - Medbridge [email or web address] Person educated: Patient Education method: Explanation, demonstration, handouts  Education comprehension: verbalized understanding, demonstrated understanding  HOME EXERCISE PROGRAM: To be issued  GOALS: Goals reviewed with  patient? Yes  SHORT TERM GOALS: Target date: 05-22-23  Evaluate for more supportive AFO for RLE and request order as appropriate. Baseline: pt has Ottobock posterior strut AFO Goal status: Goal met 05-07-23  2.  Amb. 21' with RW with SBA without requiring seated rest period. Baseline:   46' with RW on 05-26-23 Goal status: Goal met 05-26-23  3.  Perform 5x sit to stand with bil. UE support with pt demonstrating equal weight bearing on each leg. Baseline: Goal status: Goal met - 05-26-23  4.  Pt will participate in TUG assessment with use of RW. Baseline: too fatigued/weak at eval to complete this test Goal status: Goal met  5.  Negotiate 4 steps with CGA with use of bil. Hand rails Baseline:  Goal status: Goal met 05-26-23  6.  Independent in HEP for RLE strengthening.  Baseline:  Goal status: Goal met 05-26-23    LONG TERM GOALS: Target date: 06-19-23  Amb. 230' with RW with SBA without requiring seated rest period. Baseline:  Goal status: INITIAL  2.  Improve TUG score by at least 4 secs with use of RW for assistance with ambulation Baseline: TBA Goal status: INITIAL  3.  Perform 5x sit to stand transfers with LUE support only to demo improved strength in RLE.  Baseline:  Goal status: INITIAL  4.  Modified independent step negotiation with use of bil. Hand rails using step by step sequence. Baseline:  Goal status: INITIAL  5.  Obtain new AFO for increased RLE stability to decrease fall risk with  gait. Baseline:  Goal status: INITIAL  6.  Independent in updated HEP for RLE strengthening. Baseline:  Goal status: INITIAL  ASSESSMENT:  CLINICAL IMPRESSION: Pt has met 6/6 STG's; pt performed 5x sit to stand transfers, 115' gait training with use of rollator and Rt toe off AFO, step negotiation (4 steps) and performed 6 reps of step up exercise with RLE onto 4" step inside // bars and was resting in standing position on top of the step when she suddenly lost balance  due to RLE giving way; pt required total assist to prevent falling.  Pt upset after this incident but was able to rest in seated position for approx. 5" and then switch out clinic's toe off AFO with her Ott[place]ock AFO and amb. With rollator to her car independently. Pt was informed that imaging recommended as RLE strength does not appear to be signficantly improving.  Pt continues to have significant decreased RLE muscle strength and muscle endurance.  Cont with POC.    OBJECTIVE IMPAIRMENTS: Abnormal gait, decreased balance, decreased coordination, decreased endurance, decreased strength, and impaired tone.   ACTIVITY LIMITATIONS: carrying, bending, squatting, stairs, transfers, and locomotion level  PARTICIPATION LIMITATIONS: cleaning, laundry, shopping, and community activity  PERSONAL FACTORS: Behavior pattern, Past/current experiences, and Time since onset of injury/illness/exacerbation are also affecting patient's functional outcome.   REHAB POTENTIAL: Good  CLINICAL DECISION MAKING: Evolving/moderate complexity  EVALUATION COMPLEXITY: Moderate  PLAN:  PT FREQUENCY: 2x/week  PT DURATION: 8 weeks  PLANNED INTERVENTIONS: Therapeutic exercises, Therapeutic activity, Neuromuscular re-education, Balance training, Gait training, Patient/Family education, Self Care, Stair training, Orthotic/Fit training, DME instructions, and Aquatic Therapy  PLAN FOR NEXT SESSION:  check HEP for any questions; continue RLE strengthening   Kary Kos, PT 05/27/2023, 10:02 AM

## 2023-05-27 ENCOUNTER — Encounter: Payer: Self-pay | Admitting: Physical Therapy

## 2023-05-28 ENCOUNTER — Ambulatory Visit: Payer: PPO | Attending: Physical Medicine & Rehabilitation | Admitting: Physical Therapy

## 2023-05-28 DIAGNOSIS — R2689 Other abnormalities of gait and mobility: Secondary | ICD-10-CM | POA: Diagnosis not present

## 2023-05-28 DIAGNOSIS — R2681 Unsteadiness on feet: Secondary | ICD-10-CM | POA: Diagnosis not present

## 2023-05-28 DIAGNOSIS — M6281 Muscle weakness (generalized): Secondary | ICD-10-CM | POA: Insufficient documentation

## 2023-05-28 NOTE — Therapy (Signed)
OUTPATIENT PHYSICAL THERAPY NEURO TREATMENT NOTE   Patient Name: [name] MRN: [id number] DOB:January 29, 1961, 62 y.o., female Today's Date: 05/29/2023   PCP: [place], [name]., MD REFERRING PROVIDER: Ranelle Oyster, MD  END OF SESSION:  PT End of Session - 05/29/23 1632     Visit Number 6    Number of Visits 17    Date for PT Re-Evaluation 06/19/23    Authorization Type [employer]    Authorization Time Period 04-21-23 - 06-27-23    Progress Note Due on Visit 10    PT Start Time 1020    PT Stop Time 1103    PT Time Calculation (min) 43 min    Equipment Utilized During Treatment Gait belt;Other (comment)   Rt toe off AFO   Activity Tolerance Patient tolerated treatment well   limited by RLE weakness   Behavior During Therapy [facility] for tasks assessed/performed                  Past Medical History:  Diagnosis Date   Anxiety    Aortic atherosclerosis (HCC) 04/16/2021   Atypical angina    Back pain    related to spinal stenosis and disc problem, radiates down left buttocks to leg., weakness occ.   Chest pain    a. 03/2015 Cath: nl cors; b. 03/2021 Cor CTA: Ca2+ = 0. Nl Cors.   Chronic pain syndrome    Dyspnea    GERD (gastroesophageal reflux disease)    Grade I diastolic dysfunction    Headache    Hyperlipidemia    Hypertension    Lumbar post-laminectomy syndrome    LVH (left ventricular hypertrophy) 12/15/2020   a. 11/2020 Echo: EF 65-70%, no rwma, sev asymm LVH with IVSd 1.9 cm. No LVOT obs @ rest. Gr1 DD. Triv MR.   PONV (postoperative nausea and vomiting)    Pulmonary nodules    a. 03/2021 CT Chest: 3mm pulm nodules in bilat lower lobes. F/u 1 yr.   Right foot drop    Syncope    a. 11/2020 Zio: No significant arrhythmias.   Vaginal foreign object    "Uses Femring"   Past Surgical History:  Procedure Laterality Date   ABDOMINAL HYSTERECTOMY     ANTERIOR CERVICAL DECOMP/DISCECTOMY FUSION N/A 01/03/2022   Procedure: Anterior Cervical  Decompression Fusion - Cervical four-Cervical five - Cervical five-Cervical six;  Surgeon: Tia Alert, MD;  Location: [facility] OR;  Service: Neurosurgery;  Laterality: N/A;   BIOPSY  12/16/2020   Procedure: BIOPSY;  Surgeon: Meryl Dare, MD;  Location: [facility] ENDOSCOPY;  Service: Endoscopy;;   CARDIAC CATHETERIZATION N/A 04/18/2015   Procedure: Left Heart Cath and Coronary Angiography;  Surgeon: Rinaldo Cloud, MD;  Location[facility] INVASIVE CV LAB;  Service: Cardiovascular;  Laterality: N/A;   COLONOSCOPY     COLONOSCOPY W/ BIOPSIES AND POLYPECTOMY  2018   ESOPHAGOGASTRODUODENOSCOPY (EGD) WITH PROPOFOL N/A 12/16/2020   Procedure: ESOPHAGOGASTRODUODENOSCOPY (EGD) WITH PROPOFOL;  Surgeon: Meryl Dare, MD;  Location: [facility] ENDOSCOPY;  Service: Endoscopy;  Laterality: N/A;   FOOT SURGERY Bilateral    [facility] "bunion,bone spur, tendon" (1) -6'16, (1)-10'16   HEMATOMA EVACUATION N/A 01/05/2022   Procedure: Cervical Wound Exploration;  Surgeon: Coletta Memos, MD;  Location: [facility] OR;  Service: Neurosurgery;  Laterality: N/A;   IR EPIDUROGRAPHY  07/21/2018   LUMBAR LAMINECTOMY/DECOMPRESSION MICRODISCECTOMY Bilateral 12/28/2015   Procedure: MICRO LUMBAR DECOMPRESSION L4 - L5 BILATERALLY;  Surgeon: Jene Every, MD;  Location: [facility] ORS;  Service:  Orthopedics;  Laterality: Bilateral;   LUMBAR LAMINECTOMY/DECOMPRESSION MICRODISCECTOMY Bilateral 03/04/2018   Procedure: Revision of Microlumbar Decompression Bilateral Lumbar Four-Five;  Surgeon: Jene Every, MD;  Location: [facility] OR;  Service: Orthopedics;  Laterality: Bilateral;  90 mins   SAVORY DILATION N/A 12/16/2020   Procedure: SAVORY DILATION;  Surgeon: Meryl Dare, MD;  Location: [facility] ENDOSCOPY;  Service: Endoscopy;  Laterality: N/A;   SPINAL CORD STIMULATOR INSERTION N/A 09/28/2019   Procedure: THORACIC SPINAL CORD STIMULATOR INSERTION;  Surgeon: Venita Lick, MD;  Location: [facility] OR;  Service: Orthopedics;  Laterality: N/A;  2.5 hrs   SPINAL  CORD STIMULATOR REMOVAL N/A 05/27/2021   Procedure: LUMBAR SPINAL CORD STIMULATOR REMOVAL;  Surgeon: Lucy Chris, MD;  Location: [facility] ORS;  Service: Neurosurgery;  Laterality: N/A;   TUBAL LIGATION     WISDOM TOOTH EXTRACTION     WOUND EXPLORATION N/A 03/04/2018   Procedure: EXPLORATION OF LUMBAR DECOMPRESSION WOUND;  Surgeon: Jene Every, MD;  Location: [facility] OR;  Service: Orthopedics;  Laterality: N/A;   Patient Active Problem List   Diagnosis Date Noted   Frequent falls 04/14/2023   Gastroesophageal reflux disease 04/14/2023   Right upper extremity numbness 09/29/2022   Stage 3a chronic kidney disease (HCC) 09/29/2022   Cervical spondylosis with radiculopathy 01/05/2022   S/P cervical spinal fusion 01/03/2022   Cervical disc disorder with radiculopathy of cervical region 11/27/2021   Uncontrolled pain 05/27/2021   Syncope 05/13/2021   Depression with anxiety 05/13/2021   Chronic diastolic CHF (congestive heart failure) (HCC) 05/13/2021   Chronic back pain 05/13/2021   Chest tightness 05/13/2021   Lower abdominal pain 05/13/2021   Syncope and collapse 01/01/2021   Abnormal echocardiogram 01/01/2021   BPPV (benign paroxysmal positional vertigo), left 12/26/2020   Odynophagia    Dysphagia 12/15/2020   Postural dizziness with presyncope 12/14/2020   Lumbar post-laminectomy syndrome    Chronic pain 09/28/2019   Lumbar spinal stenosis    Radicular pain    Gait disorder    Difficulty in urination 07/13/2018   Back pain 07/12/2018   Hyperlipidemia 04/16/2018   GAD (generalized anxiety disorder) 04/16/2018   AKI (acute kidney injury) (HCC)    Benign essential HTN    Major depression, recurrent (HCC)    Lumbar radiculopathy 03/09/2018   Paraparesis (HCC) 03/09/2018   Essential hypertension    Chronic nonintractable headache    Leukocytosis    Acute blood loss anemia    Postoperative pain    Generalized weakness    Spinal stenosis at L4-L5 level 03/04/2018   Spinal stenosis  of lumbar region 12/28/2015   Chest pain 04/16/2015    ONSET DATE: Referral date 04-01-23  REFERRING DIAG: M54.16 (ICD-10-CM) - Lumbar radiculopathy  THERAPY DIAG:  Muscle weakness (generalized)  Other abnormalities of gait and mobility  Unsteadiness on feet  Rationale for Evaluation and Treatment: Rehabilitation  SUBJECTIVE:  SUBJECTIVE STATEMENT: Pt reports she has been doing her HEP every day this week -states she is really trying to do everything she can to get her Rt leg strong Pt accompanied by: self  PERTINENT HISTORY: Frequent falls:  Spinal cord stimulator insertion on 09-28-19; stimulator removal on 05-27-21 with resultant LLE weakness;  decompression and laminectomy on 12/28/15 at L4-L5  with redo decompression surgery on May 9,2019, post-op paraparesis with surgery to remove small hematoma; 07/12/18 fall at work (involved rolling chair) with hospital admission, transfer to CIR with d/c home 08/03/18; HTN, Bil foot surgery; left ventricular hypertrophy, chronic pain syndrome, dyspnea, hypertrophic cardiomyopathy; cervical fusion March 2023;  had carpal tunnel surgery RUE in April 2024:  LVH  PAIN:  05-26-23 Are you having pain?  Pt reports her back "bothers her"- is constant in occurrence; rates pain 4-5/10 intensity; worse with bending over; sleeping on heating pad helps Intensity: 1/10  Location:  low back Description: soreness in low back Duration; varies depending on activity or movement being performed - worse with bending over or pulling leg up to chest Aggravating factors -worse with bending over or pulling leg up to chest Alleviating factor - heating pad helps some; pain medication  PRECAUTIONS: Fall  WEIGHT BEARING RESTRICTIONS: No  FALLS: Has patient fallen in last 6 months? Yes.  Number of falls 3  LIVING ENVIRONMENT: Lives with: lives with their spouse Lives in: House/apartment Stairs: Yes: External: 5 steps; on right going up Has following equipment at home: Single point cane, Wheelchair (manual), and Lofstrand crutch  PLOF: Independent with basic ADLs, Independent with household mobility with device, Independent with community mobility with device, and Independent with transfers  PATIENT GOALS: improve strength Rt leg  OBJECTIVE:   DIAGNOSTIC FINDINGS: IMPRESSION: 1. Interval anterior discectomy and fusion from C4 through C6 with improved patency of the spinal canal and only mild residual foraminal narrowing. 2. Stable mild spinal stenosis and mild to moderate left foraminal narrowing at C3-4. 3. Stable mild spinal stenosis and mild foraminal narrowing bilaterally at C6-7. Mild foraminal narrowing bilaterally at C7-T1. 4. No cord deformity or abnormal cord signal.  No acute findings. No recent MRI of lumbar region  LOWER EXTREMITY MMT:    MMT Right Eval Left Eval  Hip flexion 3+   Hip extension    Hip abduction    Hip adduction    Hip internal rotation    Hip external rotation    Knee flexion 3-   Knee extension 3-   Ankle dorsiflexion 2-   Ankle plantarflexion    Ankle inversion    Ankle eversion    (Blank rows = not tested)   Today's Treatment: 05-28-23    GAIT: Gait pattern: decreased step length- Right, decreased stance time- Right, decreased hip/knee flexion- Right, and decreased ankle dorsiflexion- Right Distance walked: approx. 7' from lobby to high low mat table - SBA:   changed AFO from her Ott[place]ock Walk on to toe off AFO for gait training in clinic;  115' x 1 rep with rollator with use of toe off AFO on RLE Assistive device utilized: rollator  Level of assistance: SBA and CGA Comments: Pt has SIGNIFICANT DECREASED Rt quad strength and muscle endurance; used Rt toe off AFO during PT session on 05-12-23  Step training:  pt  negotiated 4 steps with use of bil. Hand rails; ascended steps using step by step sequence and descended steps sideways with LLE leading in descension - bil. UE support on hand rails;  CGA  05-07-23: Gait velocity: with Ott[place]ock Walk on AFO - 1"18 secs with rollator = .42 ft/sec   With Toe off AFO 31.9 secs with rollator = 1.03 ft/sec  Today's Treatment:  05-28-23 THEREX:  Pt performed leg press exercise for bil. LE strengthening:  40# bil. LE's 15 reps:  RLE only 20# 10 reps  Pt performed closed chain Rt quad strengthening exercise inside // bars - step up exercise onto 4" step with bil. UE support; Rt foot remained on step for entire exercise; pt performed 7 reps, then needed seated rest break;  then able to perform 4 more reps RLE step up with Rt foot remaining on step during exercise to avoid excessive fatigue   HEP - Medbridge  Access Code: [email or web address] URL: [email or web address] Date: 05/06/2023 Prepared by: Maebelle Munroe  Exercises - Beginner Bridge  - 1 x daily - 7 x weekly - 1 sets - 10 reps - 3 hold - Supine Heel Slide  - 1 x daily - 7 x weekly - 1 sets - 10 reps - Bent Knee Fallouts  - 1 x daily - 7 x weekly - 1 sets - 10 reps - Sidelying Hip Abduction  - 1 x daily - 7 x weekly - 1 sets - 10 reps - Seated Knee Flexion Extension AROM   - 1 x daily - 7 x weekly - 1 sets - 10 reps   PATIENT EDUCATION: Education details:  educated pt in recommended AFO (blue rocker) but unable to trial this orthotic as clinic does not have a small Rt blue rocker AFO; trialed toe off to assess benefit of more supportive AFO compared to the one she is currently using - one with posterior strut only; pt also instructed in HEP - Medbridge [id number] Person educated: Patient Education method: Explanation, demonstration, handouts  Education comprehension: verbalized understanding, demonstrated understanding  HOME EXERCISE PROGRAM: To be issued  GOALS: Goals reviewed with patient?  Yes  SHORT TERM GOALS: Target date: 05-22-23  Evaluate for more supportive AFO for RLE and request order as appropriate. Baseline: pt has Ottobock posterior strut AFO Goal status: Goal met 05-07-23  2.  Amb. 32' with RW with SBA without requiring seated rest period. Baseline:   46' with RW on 05-26-23 Goal status: Goal met 05-26-23  3.  Perform 5x sit to stand with bil. UE support with pt demonstrating equal weight bearing on each leg. Baseline: Goal status: Goal met - 05-26-23  4.  Pt will participate in TUG assessment with use of RW. Baseline: too fatigued/weak at eval to complete this test Goal status: Goal met  5.  Negotiate 4 steps with CGA with use of bil. Hand rails Baseline:  Goal status: Goal met 05-26-23  6.  Independent in HEP for RLE strengthening.  Baseline:  Goal status: Goal met 05-26-23    LONG TERM GOALS: Target date: 06-19-23  Amb. 230' with RW with SBA without requiring seated rest period. Baseline:  Goal status: INITIAL  2.  Improve TUG score by at least 4 secs with use of RW for assistance with ambulation Baseline: TBA Goal status: INITIAL  3.  Perform 5x sit to stand transfers with LUE support only to demo improved strength in RLE.  Baseline:  Goal status: INITIAL  4.  Modified independent step negotiation with use of bil. Hand rails using step by step sequence. Baseline:  Goal status: INITIAL  5.  Obtain new AFO for increased RLE stability to decrease fall risk with gait. Baseline:  Goal  status: INITIAL  6.  Independent in updated HEP for RLE strengthening. Baseline:  Goal status: INITIAL  ASSESSMENT:  CLINICAL IMPRESSION: PT session focused on RLE strengthening in closed chain position including leg press and step up exercise.  Pt demonstrated increased RLE strength and muscle endurance with pt able to perform 7 reps of step up exercise and then 4 additional reps after short seated rest break.  Pt able to amb. 115' with rollator with use of  toe off AFO and also perform step negotiation with no near falls or LOB in today's session.   Cont with POC.    OBJECTIVE IMPAIRMENTS: Abnormal gait, decreased balance, decreased coordination, decreased endurance, decreased strength, and impaired tone.   ACTIVITY LIMITATIONS: carrying, bending, squatting, stairs, transfers, and locomotion level  PARTICIPATION LIMITATIONS: cleaning, laundry, shopping, and community activity  PERSONAL FACTORS: Behavior pattern, Past/current experiences, and Time since onset of injury/illness/exacerbation are also affecting patient's functional outcome.   REHAB POTENTIAL: Good  CLINICAL DECISION MAKING: Evolving/moderate complexity  EVALUATION COMPLEXITY: Moderate  PLAN:  PT FREQUENCY: 2x/week  PT DURATION: 8 weeks  PLANNED INTERVENTIONS: Therapeutic exercises, Therapeutic activity, Neuromuscular re-education, Balance training, Gait training, Patient/Family education, Self Care, Stair training, Orthotic/Fit training, DME instructions, and Aquatic Therapy  PLAN FOR NEXT SESSION:  check HEP for any questions; continue RLE strengthening   Kary Kos, PT 05/29/2023, 4:34 PM

## 2023-05-29 ENCOUNTER — Encounter: Payer: Self-pay | Admitting: Physical Therapy

## 2023-06-02 ENCOUNTER — Ambulatory Visit: Payer: PPO | Admitting: Physical Therapy

## 2023-06-02 DIAGNOSIS — M6281 Muscle weakness (generalized): Secondary | ICD-10-CM | POA: Diagnosis not present

## 2023-06-02 DIAGNOSIS — R2689 Other abnormalities of gait and mobility: Secondary | ICD-10-CM

## 2023-06-02 NOTE — Therapy (Unsigned)
OUTPATIENT PHYSICAL THERAPY NEURO TREATMENT NOTE   Patient Name: [name] MRN: [id number] DOB:[birth year], 62 y.o., female Today's Date: 06/03/2023   PCP: [place], [name]., MD REFERRING PROVIDER: Ranelle Oyster, MD  END OF SESSION:  PT End of Session - 06/03/23 1211     Visit Number 7    Number of Visits 17    Date for PT Re-Evaluation 06/19/23    Authorization Type [employer]    Authorization Time Period 04-21-23 - 06-27-23    Progress Note Due on Visit 10    PT Start Time 0932    PT Stop Time 1016    PT Time Calculation (min) 44 min    Equipment Utilized During Treatment Gait belt;Other (comment)   Rt toe off AFO   Activity Tolerance Patient tolerated treatment well   limited by RLE weakness   Behavior During Therapy [facility] for tasks assessed/performed                   Past Medical History:  Diagnosis Date   Anxiety    Aortic atherosclerosis (HCC) 04/16/2021   Atypical angina    Back pain    related to spinal stenosis and disc problem, radiates down left buttocks to leg., weakness occ.   Chest pain    a. 03/2015 Cath: nl cors; b. 03/2021 Cor CTA: Ca2+ = 0. Nl Cors.   Chronic pain syndrome    Dyspnea    GERD (gastroesophageal reflux disease)    Grade I diastolic dysfunction    Headache    Hyperlipidemia    Hypertension    Lumbar post-laminectomy syndrome    LVH (left ventricular hypertrophy) 12/15/2020   a. 11/2020 Echo: EF 65-70%, no rwma, sev asymm LVH with IVSd 1.9 cm. No LVOT obs @ rest. Gr1 DD. Triv MR.   PONV (postoperative nausea and vomiting)    Pulmonary nodules    a. 03/2021 CT Chest: 3mm pulm nodules in bilat lower lobes. F/u 1 yr.   Right foot drop    Syncope    a. 11/2020 Zio: No significant arrhythmias.   Vaginal foreign object    "Uses Femring"   Past Surgical History:  Procedure Laterality Date   ABDOMINAL HYSTERECTOMY     ANTERIOR CERVICAL DECOMP/DISCECTOMY FUSION N/A 01/03/2022   Procedure: Anterior Cervical  Decompression Fusion - Cervical four-Cervical five - Cervical five-Cervical six;  Surgeon: Tia Alert, MD;  Location: [facility] OR;  Service: Neurosurgery;  Laterality: N/A;   BIOPSY  12/16/2020   Procedure: BIOPSY;  Surgeon: Meryl Dare, MD;  Location: [facility] ENDOSCOPY;  Service: Endoscopy;;   CARDIAC CATHETERIZATION N/A 04/18/2015   Procedure: Left Heart Cath and Coronary Angiography;  Surgeon: Rinaldo Cloud, MD;  Location: [facility] INVASIVE CV LAB;  Service: Cardiovascular;  Laterality: N/A;   COLONOSCOPY     COLONOSCOPY W/ BIOPSIES AND POLYPECTOMY  2018   ESOPHAGOGASTRODUODENOSCOPY (EGD) WITH PROPOFOL N/A 12/16/2020   Procedure: ESOPHAGOGASTRODUODENOSCOPY (EGD) WITH PROPOFOL;  Surgeon: Meryl Dare, MD;  Location: [facility] ENDOSCOPY;  Service: Endoscopy;  Laterality: N/A;   FOOT SURGERY Bilateral    [facility] "bunion,bone spur, tendon" (1) -6'16, (1)-10'16   HEMATOMA EVACUATION N/A 01/05/2022   Procedure: Cervical Wound Exploration;  Surgeon: Coletta Memos, MD;  Location: [facility] OR;  Service: Neurosurgery;  Laterality: N/A;   IR EPIDUROGRAPHY  07/21/2018   LUMBAR LAMINECTOMY/DECOMPRESSION MICRODISCECTOMY Bilateral 12/28/2015   Procedure: MICRO LUMBAR DECOMPRESSION L4 - L5 BILATERALLY;  Surgeon: Jene Every, MD;  Location: [facility] ORS;  Service: Orthopedics;  Laterality: Bilateral;   LUMBAR LAMINECTOMY/DECOMPRESSION MICRODISCECTOMY Bilateral 03/04/2018   Procedure: Revision of Microlumbar Decompression Bilateral Lumbar Four-Five;  Surgeon: Jene Every, MD;  Location: [facility] OR;  Service: Orthopedics;  Laterality: Bilateral;  90 mins   SAVORY DILATION N/A 12/16/2020   Procedure: SAVORY DILATION;  Surgeon: Meryl Dare, MD;  Location: [facility] ENDOSCOPY;  Service: Endoscopy;  Laterality: N/A;   SPINAL CORD STIMULATOR INSERTION N/A 09/28/2019   Procedure: THORACIC SPINAL CORD STIMULATOR INSERTION;  Surgeon: Venita Lick, MD;  Location: [facility] OR;  Service: Orthopedics;  Laterality: N/A;  2.5 hrs   SPINAL  CORD STIMULATOR REMOVAL N/A 05/27/2021   Procedure: LUMBAR SPINAL CORD STIMULATOR REMOVAL;  Surgeon: Lucy Chris, MD;  Location: [facility] ORS;  Service: Neurosurgery;  Laterality: N/A;   TUBAL LIGATION     WISDOM TOOTH EXTRACTION     WOUND EXPLORATION N/A 03/04/2018   Procedure: EXPLORATION OF LUMBAR DECOMPRESSION WOUND;  Surgeon: Jene Every, MD;  Location: [facility] OR;  Service: Orthopedics;  Laterality: N/A;   Patient Active Problem List   Diagnosis Date Noted   Frequent falls 04/14/2023   Gastroesophageal reflux disease 04/14/2023   Right upper extremity numbness 09/29/2022   Stage 3a chronic kidney disease (HCC) 09/29/2022   Cervical spondylosis with radiculopathy 01/05/2022   S/P cervical spinal fusion 01/03/2022   Cervical disc disorder with radiculopathy of cervical region 11/27/2021   Uncontrolled pain 05/27/2021   Syncope 05/13/2021   Depression with anxiety 05/13/2021   Chronic diastolic CHF (congestive heart failure) (HCC) 05/13/2021   Chronic back pain 05/13/2021   Chest tightness 05/13/2021   Lower abdominal pain 05/13/2021   Syncope and collapse 01/01/2021   Abnormal echocardiogram 01/01/2021   BPPV (benign paroxysmal positional vertigo), left 12/26/2020   Odynophagia    Dysphagia 12/15/2020   Postural dizziness with presyncope 12/14/2020   Lumbar post-laminectomy syndrome    Chronic pain 09/28/2019   Lumbar spinal stenosis    Radicular pain    Gait disorder    Difficulty in urination 07/13/2018   Back pain 07/12/2018   Hyperlipidemia 04/16/2018   GAD (generalized anxiety disorder) 04/16/2018   AKI (acute kidney injury) (HCC)    Benign essential HTN    Major depression, recurrent (HCC)    Lumbar radiculopathy 03/09/2018   Paraparesis (HCC) 03/09/2018   Essential hypertension    Chronic nonintractable headache    Leukocytosis    Acute blood loss anemia    Postoperative pain    Generalized weakness    Spinal stenosis at L4-L5 level 03/04/2018   Spinal stenosis  of lumbar region 12/28/2015   Chest pain 04/16/2015    ONSET DATE: Referral date 04-01-23  REFERRING DIAG: M54.16 (ICD-10-CM) - Lumbar radiculopathy  THERAPY DIAG:  Muscle weakness (generalized)  Other abnormalities of gait and mobility  Rationale for Evaluation and Treatment: Rehabilitation  SUBJECTIVE:  SUBJECTIVE STATEMENT: Pt reports she continues to do her home exercise program; has not heard anything from [employer] so she assumes the [employer] AFO has been approved Pt accompanied by: self  PERTINENT HISTORY: Frequent falls:  Spinal cord stimulator insertion on 09-28-19; stimulator removal on 05-27-21 with resultant LLE weakness;  decompression and laminectomy on 12/28/15 at L4-L5  with redo decompression surgery on May 9,2019, post-op paraparesis with surgery to remove small hematoma; 07/12/18 fall at work (involved rolling chair) with hospital admission, transfer to CIR with d/c home 08/03/18; HTN, Bil foot surgery; left ventricular hypertrophy, chronic pain syndrome, dyspnea, hypertrophic cardiomyopathy; cervical fusion March 2023;  had carpal tunnel surgery RUE in April 2024:  LVH  PAIN:  06-02-23 Are you having pain?  Pt reports her back "bothers her"- is constant in occurrence; rates pain 4-5/10 intensity; worse with bending over; sleeping on heating pad helps Intensity: 1/10  Location:  low back Description: soreness in low back Duration; varies depending on activity or movement being performed - worse with bending over or pulling leg up to chest Aggravating factors -worse with bending over or pulling leg up to chest Alleviating factor - heating pad helps some; pain medication  PRECAUTIONS: Fall  WEIGHT BEARING RESTRICTIONS: No  FALLS: Has patient fallen in last 6 months? Yes. Number of falls  3  LIVING ENVIRONMENT: Lives with: lives with their spouse Lives in: House/apartment Stairs: Yes: External: 5 steps; on right going up Has following equipment at home: Single point cane, Wheelchair (manual), and L[employer]strand crutch  PLOF: Independent with basic ADLs, Independent with household mobility with device, Independent with community mobility with device, and Independent with transfers  PATIENT GOALS: improve strength Rt leg  OBJECTIVE:   DIAGNOSTIC FINDINGS: IMPRESSION: 1. Interval anterior discectomy and fusion from C4 through C6 with improved patency of the spinal canal and only mild residual foraminal narrowing. 2. Stable mild spinal stenosis and mild to moderate left foraminal narrowing at C3-4. 3. Stable mild spinal stenosis and mild foraminal narrowing bilaterally at C6-7. Mild foraminal narrowing bilaterally at C7-T1. 4. No cord deformity or abnormal cord signal.  No acute findings. No recent MRI of lumbar region  LOWER EXTREMITY MMT:    MMT Right Eval Left Eval  Hip flexion 3+   Hip extension    Hip abduction    Hip adduction    Hip internal rotation    Hip external rotation    Knee flexion 3-   Knee extension 3-   Ankle dorsiflexion 2-   Ankle plantarflexion    Ankle inversion    Ankle eversion    (Blank rows = not tested)   Today's Treatment: 06-02-23    GAIT: Gait pattern: decreased step length- Right, decreased stance time- Right, decreased hip/knee flexion- Right, and decreased ankle dorsiflexion- Right Distance walked: approx. 19' from lobby to high low mat table - SBA:   changed AFO from her [place]ock Walk on to toe off AFO for gait training in clinic;  115' x 1 rep with rollator with use of toe off AFO on RLE Assistive device utilized: rollator  Level of assistance: SBA and CGA Comments: Pt has SIGNIFICANT DECREASED Rt quad strength and muscle endurance; used Rt toe off AFO during PT session on 05-12-23     05-07-23: Gait velocity: with  Ott[place]ock Walk on AFO - 1"18 secs with rollator = .42 ft/sec   With Toe off AFO 31.9 secs with rollator = 1.03 ft/sec  Today's Treatment:  06-02-23  THEREX:  Pt performed leg press exercise for bil. LE strengthening:  40# bil. LE's 10 reps:  30# RLE only 5 reps: then decreased weight to 25# 10 reps RLE only  Pt performed closed chain Rt quad strengthening exercise inside // bars - step up exercise onto 4" step with bil. UE support; Rt foot remained on step for entire exercise; pt performed 7 reps, then needed seated rest break;  then able to perform 4 more reps RLE step up with Rt foot remaining on step during exercise to avoid excessive fatigue  Tap ups with LLE onto 4" step for RLE isometric and closed chain strengthening - 10 reps  Forward stepping LLE 5 reps with bil. UE support;  stepping laterally 5 reps; stepping backward 5 reps with LLE - with bil. UE support on // bars  Squats 5 reps with bil. UE support on // bars (chair behind her inside // bars)     HEP - Medbridge  Access Code: [email or web address] URL: [email or web address] Date: 05/06/2023 Prepared by: Maebelle Munroe  Exercises - Beginner Bridge  - 1 x daily - 7 x weekly - 1 sets - 10 reps - 3 hold - Supine Heel Slide  - 1 x daily - 7 x weekly - 1 sets - 10 reps - Bent Knee Fallouts  - 1 x daily - 7 x weekly - 1 sets - 10 reps - Sidelying Hip Abduction  - 1 x daily - 7 x weekly - 1 sets - 10 reps - Seated Knee Flexion Extension AROM   - 1 x daily - 7 x weekly - 1 sets - 10 reps   PATIENT EDUCATION: Education details:  educated pt in recommended AFO (blue rocker) but unable to trial this orthotic as clinic does not have a small Rt blue rocker AFO; trialed toe off to assess benefit of more supportive AFO compared to the one she is currently using - one with posterior strut only; pt also instructed in HEP - Medbridge [id number] Person educated: Patient Education method: Explanation, demonstration, handouts  Education  comprehension: verbalized understanding, demonstrated understanding  HOME EXERCISE PROGRAM: To be issued  GOALS: Goals reviewed with patient? Yes  SHORT TERM GOALS: Target date: 05-22-23  Evaluate for more supportive AFO for RLE and request order as appropriate. Baseline: pt has Ottobock posterior strut AFO Goal status: Goal met 05-07-23  2.  Amb. 68' with RW with SBA without requiring seated rest period. Baseline:   69' with RW on 05-26-23 Goal status: Goal met 05-26-23  3.  Perform 5x sit to stand with bil. UE support with pt demonstrating equal weight bearing on each leg. Baseline: Goal status: Goal met - 05-26-23  4.  Pt will participate in TUG assessment with use of RW. Baseline: too fatigued/weak at eval to complete this test Goal status: Goal met  5.  Negotiate 4 steps with CGA with use of bil. Hand rails Baseline:  Goal status: Goal met 05-26-23  6.  Independent in HEP for RLE strengthening.  Baseline:  Goal status: Goal met 05-26-23    LONG TERM GOALS: Target date: 06-19-23  Amb. 230' with RW with SBA without requiring seated rest period. Baseline:  Goal status: INITIAL  2.  Improve TUG score by at least 4 secs with use of RW for assistance with ambulation Baseline: TBA Goal status: INITIAL  3.  Perform 5x sit to stand transfers with LUE support only to demo improved strength in RLE.  Baseline:  Goal status: INITIAL  4.  Modified independent step negotiation with use of bil. Hand rails using step by step sequence. Baseline:  Goal status: INITIAL  5.  Obtain new AFO for increased RLE stability to decrease fall risk with gait. Baseline:  Goal status: INITIAL  6.  Independent in updated HEP for RLE strengthening. Baseline:  Goal status: INITIAL  ASSESSMENT:  CLINICAL IMPRESSION: PT session focused on RLE strengthening in closed chain position and also on gait training with use of toe off AFO on RLE.  Pt's reps with step up exercise onto 4" step remains  7 reps, then 4 additional reps after seated rest period (same # as performed in previous session last week).  Pt continues to have decreased muscle strength and decreased endurance in RLE.   Cont with POC.    OBJECTIVE IMPAIRMENTS: Abnormal gait, decreased balance, decreased coordination, decreased endurance, decreased strength, and impaired tone.   ACTIVITY LIMITATIONS: carrying, bending, squatting, stairs, transfers, and locomotion level  PARTICIPATION LIMITATIONS: cleaning, laundry, shopping, and community activity  PERSONAL FACTORS: Behavior pattern, Past/current experiences, and Time since onset of injury/illness/exacerbation are also affecting patient's functional outcome.   REHAB POTENTIAL: Good  CLINICAL DECISION MAKING: Evolving/moderate complexity  EVALUATION COMPLEXITY: Moderate  PLAN:  PT FREQUENCY: 2x/week  PT DURATION: 8 weeks  PLANNED INTERVENTIONS: Therapeutic exercises, Therapeutic activity, Neuromuscular re-education, Balance training, Gait training, Patient/Family education, Self Care, Stair training, Orthotic/Fit training, DME instructions, and Aquatic Therapy  PLAN FOR NEXT SESSION:  check HEP for any questions; continue RLE strengthening   Kary Kos, PT 06/03/2023, 12:13 PM

## 2023-06-03 ENCOUNTER — Encounter: Payer: Self-pay | Admitting: Physical Therapy

## 2023-06-09 ENCOUNTER — Ambulatory Visit: Payer: PPO | Admitting: Physical Therapy

## 2023-06-09 DIAGNOSIS — M6281 Muscle weakness (generalized): Secondary | ICD-10-CM | POA: Diagnosis not present

## 2023-06-09 DIAGNOSIS — R2689 Other abnormalities of gait and mobility: Secondary | ICD-10-CM

## 2023-06-09 NOTE — Therapy (Unsigned)
OUTPATIENT PHYSICAL THERAPY NEURO TREATMENT NOTE   Patient Name: [name] MRN: [id number] DOB:[birth year], 62 y.o., female Today's Date: 06/10/2023   PCP: [place], [name]., MD REFERRING PROVIDER: Ranelle Oyster, MD  END OF SESSION:  PT End of Session - 06/10/23 1855     Visit Number 8    Number of Visits 17    Date for PT Re-Evaluation 06/19/23    Authorization Type [employer]    Authorization Time Period 04-21-23 - 06-27-23    Progress Note Due on Visit 10    PT Start Time 0934    PT Stop Time 1017    PT Time Calculation (min) 43 min    Equipment Utilized During Treatment Gait belt;Other (comment)   Rt toe off AFO   Activity Tolerance Patient tolerated treatment well   limited by RLE weakness   Behavior During Therapy [facility] for tasks assessed/performed                    Past Medical History:  Diagnosis Date   Anxiety    Aortic atherosclerosis (HCC) 04/16/2021   Atypical angina    Back pain    related to spinal stenosis and disc problem, radiates down left buttocks to leg., weakness occ.   Chest pain    a. 03/2015 Cath: nl cors; b. 03/2021 Cor CTA: Ca2+ = 0. Nl Cors.   Chronic pain syndrome    Dyspnea    GERD (gastroesophageal reflux disease)    Grade I diastolic dysfunction    Headache    Hyperlipidemia    Hypertension    Lumbar post-laminectomy syndrome    LVH (left ventricular hypertrophy) 12/15/2020   a. 11/2020 Echo: EF 65-70%, no rwma, sev asymm LVH with IVSd 1.9 cm. No LVOT obs @ rest. Gr1 DD. Triv MR.   PONV (postoperative nausea and vomiting)    Pulmonary nodules    a. 03/2021 CT Chest: 3mm pulm nodules in bilat lower lobes. F/u 1 yr.   Right foot drop    Syncope    a. 11/2020 Zio: No significant arrhythmias.   Vaginal foreign object    "Uses Femring"   Past Surgical History:  Procedure Laterality Date   ABDOMINAL HYSTERECTOMY     ANTERIOR CERVICAL DECOMP/DISCECTOMY FUSION N/A 01/03/2022   Procedure: Anterior Cervical  Decompression Fusion - Cervical four-Cervical five - Cervical five-Cervical six;  Surgeon: Tia Alert, MD;  Location: [facility] OR;  Service: Neurosurgery;  Laterality: N/A;   BIOPSY  12/16/2020   Procedure: BIOPSY;  Surgeon: Meryl Dare, MD;  Location: [facility] ENDOSCOPY;  Service: Endoscopy;;   CARDIAC CATHETERIZATION N/A 04/18/2015   Procedure: Left Heart Cath and Coronary Angiography;  Surgeon: Rinaldo Cloud, MD;  Location: [facility] INVASIVE CV LAB;  Service: Cardiovascular;  Laterality: N/A;   COLONOSCOPY     COLONOSCOPY W/ BIOPSIES AND POLYPECTOMY  2018   ESOPHAGOGASTRODUODENOSCOPY (EGD) WITH PROPOFOL N/A 12/16/2020   Procedure: ESOPHAGOGASTRODUODENOSCOPY (EGD) WITH PROPOFOL;  Surgeon: Meryl Dare, MD;  Location: [facility] ENDOSCOPY;  Service: Endoscopy;  Laterality: N/A;   FOOT SURGERY Bilateral    [facility] "bunion,bone spur, tendon" (1) -6'16, (1)-10'16   HEMATOMA EVACUATION N/A 01/05/2022   Procedure: Cervical Wound Exploration;  Surgeon: Coletta Memos, MD;  Location: [facility] OR;  Service: Neurosurgery;  Laterality: N/A;   IR EPIDUROGRAPHY  07/21/2018   LUMBAR LAMINECTOMY/DECOMPRESSION MICRODISCECTOMY Bilateral 12/28/2015   Procedure: MICRO LUMBAR DECOMPRESSION L4 - L5 BILATERALLY;  Surgeon: Jene Every, MD;  Location: [facility] ORS;  Service: Orthopedics;  Laterality: Bilateral;   LUMBAR LAMINECTOMY/DECOMPRESSION MICRODISCECTOMY Bilateral 03/04/2018   Procedure: Revision of Microlumbar Decompression Bilateral Lumbar Four-Five;  Surgeon: Jene Every, MD;  Location: [facility] OR;  Service: Orthopedics;  Laterality: Bilateral;  90 mins   SAVORY DILATION N/A 12/16/2020   Procedure: SAVORY DILATION;  Surgeon: Meryl Dare, MD;  Location: [facility] ENDOSCOPY;  Service: Endoscopy;  Laterality: N/A;   SPINAL CORD STIMULATOR INSERTION N/A 09/28/2019   Procedure: THORACIC SPINAL CORD STIMULATOR INSERTION;  Surgeon: Venita Lick, MD;  Location: [facility] OR;  Service: Orthopedics;  Laterality: N/A;  2.5 hrs   SPINAL  CORD STIMULATOR REMOVAL N/A 05/27/2021   Procedure: LUMBAR SPINAL CORD STIMULATOR REMOVAL;  Surgeon: Lucy Chris, MD;  Location: [facility] ORS;  Service: Neurosurgery;  Laterality: N/A;   TUBAL LIGATION     WISDOM TOOTH EXTRACTION     WOUND EXPLORATION N/A 03/04/2018   Procedure: EXPLORATION OF LUMBAR DECOMPRESSION WOUND;  Surgeon: Jene Every, MD;  Location: [facility] OR;  Service: Orthopedics;  Laterality: N/A;   Patient Active Problem List   Diagnosis Date Noted   Frequent falls 04/14/2023   Gastroesophageal reflux disease 04/14/2023   Right upper extremity numbness 09/29/2022   Stage 3a chronic kidney disease (HCC) 09/29/2022   Cervical spondylosis with radiculopathy 01/05/2022   S/P cervical spinal fusion 01/03/2022   Cervical disc disorder with radiculopathy of cervical region 11/27/2021   Uncontrolled pain 05/27/2021   Syncope 05/13/2021   Depression with anxiety 05/13/2021   Chronic diastolic CHF (congestive heart failure) (HCC) 05/13/2021   Chronic back pain 05/13/2021   Chest tightness 05/13/2021   Lower abdominal pain 05/13/2021   Syncope and collapse 01/01/2021   Abnormal echocardiogram 01/01/2021   BPPV (benign paroxysmal positional vertigo), left 12/26/2020   Odynophagia    Dysphagia 12/15/2020   Postural dizziness with presyncope 12/14/2020   Lumbar post-laminectomy syndrome    Chronic pain 09/28/2019   Lumbar spinal stenosis    Radicular pain    Gait disorder    Difficulty in urination 07/13/2018   Back pain 07/12/2018   Hyperlipidemia 04/16/2018   GAD (generalized anxiety disorder) 04/16/2018   AKI (acute kidney injury) (HCC)    Benign essential HTN    Major depression, recurrent (HCC)    Lumbar radiculopathy 03/09/2018   Paraparesis (HCC) 03/09/2018   Essential hypertension    Chronic nonintractable headache    Leukocytosis    Acute blood loss anemia    Postoperative pain    Generalized weakness    Spinal stenosis at L4-L5 level 03/04/2018   Spinal stenosis  of lumbar region 12/28/2015   Chest pain 04/16/2015    ONSET DATE: Referral date 04-01-23  REFERRING DIAG: M54.16 (ICD-10-CM) - Lumbar radiculopathy  THERAPY DIAG:  Muscle weakness (generalized)  Other abnormalities of gait and mobility  Rationale for Evaluation and Treatment: Rehabilitation  SUBJECTIVE:  SUBJECTIVE STATEMENT: Pt reports she received a call from [facility] - they moved her appt to 8-26 for delivery of the Blue Rocker AFO Pt accompanied by: self  PERTINENT HISTORY: Frequent falls:  Spinal cord stimulator insertion on 09-28-19; stimulator removal on 05-27-21 with resultant LLE weakness;  decompression and laminectomy on 12/28/15 at L4-L5  with redo decompression surgery on May 9,2019, post-op paraparesis with surgery to remove small hematoma; 07/12/18 fall at work (involved rolling chair) with hospital admission, transfer to CIR with d/c home 08/03/18; HTN, Bil foot surgery; left ventricular hypertrophy, chronic pain syndrome, dyspnea, hypertrophic cardiomyopathy; cervical fusion March 2023;  had carpal tunnel surgery RUE in April 2024:  LVH  PAIN:  06-09-23 Are you having pain?  Pt reports her back "bothers her"- is constant in occurrence; rates pain 4-5/10 intensity; worse with bending over; sleeping on heating pad helps Intensity: 1/10  Location:  low back Description: soreness in low back Duration; varies depending on activity or movement being performed - worse with bending over or pulling leg up to chest Aggravating factors -worse with bending over or pulling leg up to chest Alleviating factor - heating pad helps some; pain medication  PRECAUTIONS: Fall  WEIGHT BEARING RESTRICTIONS: No  FALLS: Has patient fallen in last 6 months? Yes. Number of falls 3  LIVING ENVIRONMENT: Lives with:  lives with their spouse Lives in: House/apartment Stairs: Yes: External: 5 steps; on right going up Has following equipment at home: Single point cane, Wheelchair (manual), and Lofstrand crutch  PLOF: Independent with basic ADLs, Independent with household mobility with device, Independent with community mobility with device, and Independent with transfers  PATIENT GOALS: improve strength Rt leg  OBJECTIVE:   DIAGNOSTIC FINDINGS: IMPRESSION: 1. Interval anterior discectomy and fusion from C4 through C6 with improved patency of the spinal canal and only mild residual foraminal narrowing. 2. Stable mild spinal stenosis and mild to moderate left foraminal narrowing at C3-4. 3. Stable mild spinal stenosis and mild foraminal narrowing bilaterally at C6-7. Mild foraminal narrowing bilaterally at C7-T1. 4. No cord deformity or abnormal cord signal.  No acute findings. No recent MRI of lumbar region  LOWER EXTREMITY MMT:    MMT Right Eval Left Eval  Hip flexion 3+   Hip extension    Hip abduction    Hip adduction    Hip internal rotation    Hip external rotation    Knee flexion 3-   Knee extension 3-   Ankle dorsiflexion 2-   Ankle plantarflexion    Ankle inversion    Ankle eversion    (Blank rows = not tested)   Today's Treatment: 06-09-23    GAIT: Gait pattern: decreased step length- Right, decreased stance time- Right, decreased hip/knee flexion- Right, and decreased ankle dorsiflexion- Right Distance walked: approx. 20' from lobby to high low mat table - SBA:   changed AFO from her Ott[place]ock Walk on to toe off AFO for gait training in clinic;  115' x 1 rep with rollator with use of toe off AFO on RLE Assistive device utilized: rollator  Level of assistance: SBA and CGA Comments: Pt has SIGNIFICANT DECREASED Rt quad strength and muscle endurance; used Rt toe off AFO during PT session on 05-12-23     05-07-23: Gait velocity: with Ott[place]ock Walk on AFO - 1"18 secs with  rollator = .42 ft/sec   With Toe off AFO 31.9 secs with rollator = 1.03 ft/sec  Today's Treatment:  06-02-23  THEREX:   115' gait  5x sit to stand from mat  5 reps with Rt foot on 4" step with min assist  Step down exercise RLE from 2" step attempted on 2" step inside // bars but pt unable to perform due to quad weakness  Tap ups to 2" step with RLE - 10 reps inside  // bars with UE support   Leg press 40# 5 reps bil. LE's:  35# 10 reps ; 20# RLE 10 reps  Pt performed closed chain Rt quad strengthening exercise inside // bars - step up exercise onto 4" step with bil. UE support; Rt foot remained on step for entire exercise; pt performed 7 reps, then needed seated rest break;  then able to perform 5 more reps RLE step up with Rt foot remaining on step during exercise to avoid excessive fatigue  Tap ups with LLE onto 4" step for RLE isometric and closed chain strengthening - 10 reps     HEP - Medbridge  Access Code: [email or web address] URL: [email or web address] Date: 05/06/2023 Prepared by: Maebelle Munroe  Exercises - Beginner Bridge  - 1 x daily - 7 x weekly - 1 sets - 10 reps - 3 hold - Supine Heel Slide  - 1 x daily - 7 x weekly - 1 sets - 10 reps - Bent Knee Fallouts  - 1 x daily - 7 x weekly - 1 sets - 10 reps - Sidelying Hip Abduction  - 1 x daily - 7 x weekly - 1 sets - 10 reps - Seated Knee Flexion Extension AROM   - 1 x daily - 7 x weekly - 1 sets - 10 reps   PATIENT EDUCATION: Education details:  educated pt in recommended AFO (blue rocker) but unable to trial this orthotic as clinic does not have a small Rt blue rocker AFO; trialed toe off to assess benefit of more supportive AFO compared to the one she is currently using - one with posterior strut only; pt also instructed in HEP - Medbridge [id number] Person educated: Patient Education method: Explanation, demonstration, handouts  Education comprehension: verbalized understanding, demonstrated understanding  HOME  EXERCISE PROGRAM: To be issued  GOALS: Goals reviewed with patient? Yes  SHORT TERM GOALS: Target date: 05-22-23  Evaluate for more supportive AFO for RLE and request order as appropriate. Baseline: pt has Ottobock posterior strut AFO Goal status: Goal met 05-07-23  2.  Amb. 75' with RW with SBA without requiring seated rest period. Baseline:   69' with RW on 05-26-23 Goal status: Goal met 05-26-23  3.  Perform 5x sit to stand with bil. UE support with pt demonstrating equal weight bearing on each leg. Baseline: Goal status: Goal met - 05-26-23  4.  Pt will participate in TUG assessment with use of RW. Baseline: too fatigued/weak at eval to complete this test Goal status: Goal met  5.  Negotiate 4 steps with CGA with use of bil. Hand rails Baseline:  Goal status: Goal met 05-26-23  6.  Independent in HEP for RLE strengthening.  Baseline:  Goal status: Goal met 05-26-23    LONG TERM GOALS: Target date: 06-19-23  Amb. 230' with RW with SBA without requiring seated rest period. Baseline:  Goal status: INITIAL  2.  Improve TUG score by at least 4 secs with use of RW for assistance with ambulation Baseline: TBA Goal status: INITIAL  3.  Perform 5x sit to stand transfers with LUE support only to demo improved strength in RLE.  Baseline:  Goal status: INITIAL  4.  Modified independent step negotiation with use of bil. Hand rails using step by step sequence. Baseline:  Goal status: INITIAL  5.  Obtain new AFO for increased RLE stability to decrease fall risk with gait. Baseline:  Goal status: INITIAL  6.  Independent in updated HEP for RLE strengthening. Baseline:  Goal status: INITIAL  ASSESSMENT:  CLINICAL IMPRESSION: PT session focused on RLE strengthening in closed chain position and also on gait training with use of toe off AFO on RLE.  Pt's reps with step up exercise onto 4" step remains 7 reps, then 5 additional reps after seated rest period, 1 additional rep on  2nd set performed.  Pt unable to perform step down exercise due to eccentric quad weakness RLE.  Pt continues to have decreased muscle strength and decreased endurance in RLE.   Cont with POC.    OBJECTIVE IMPAIRMENTS: Abnormal gait, decreased balance, decreased coordination, decreased endurance, decreased strength, and impaired tone.   ACTIVITY LIMITATIONS: carrying, bending, squatting, stairs, transfers, and locomotion level  PARTICIPATION LIMITATIONS: cleaning, laundry, shopping, and community activity  PERSONAL FACTORS: Behavior pattern, Past/current experiences, and Time since onset of injury/illness/exacerbation are also affecting patient's functional outcome.   REHAB POTENTIAL: Good  CLINICAL DECISION MAKING: Evolving/moderate complexity  EVALUATION COMPLEXITY: Moderate  PLAN:  PT FREQUENCY: 2x/week  PT DURATION: 8 weeks  PLANNED INTERVENTIONS: Therapeutic exercises, Therapeutic activity, Neuromuscular re-education, Balance training, Gait training, Patient/Family education, Self Care, Stair training, Orthotic/Fit training, DME instructions, and Aquatic Therapy  PLAN FOR NEXT SESSION:  check HEP for any questions; continue RLE strengthening   Kary Kos, PT 06/10/2023, 6:57 PM

## 2023-06-10 ENCOUNTER — Encounter: Payer: Self-pay | Admitting: Physical Therapy

## 2023-06-10 ENCOUNTER — Other Ambulatory Visit (HOSPITAL_COMMUNITY): Payer: Self-pay

## 2023-06-10 ENCOUNTER — Other Ambulatory Visit (HOSPITAL_BASED_OUTPATIENT_CLINIC_OR_DEPARTMENT_OTHER): Payer: Self-pay | Admitting: Internal Medicine

## 2023-06-10 MED ORDER — AMLODIPINE BESYLATE 5 MG PO TABS
5.0000 mg | ORAL_TABLET | Freq: Every day | ORAL | 2 refills | Status: AC
  Filled 2023-06-10 – 2023-06-11 (×2): qty 90, 90d supply, fill #0
  Filled 2023-09-05: qty 90, 90d supply, fill #1
  Filled 2024-04-21: qty 90, 90d supply, fill #2

## 2023-06-11 ENCOUNTER — Ambulatory Visit: Payer: PPO | Admitting: Physical Therapy

## 2023-06-11 ENCOUNTER — Other Ambulatory Visit: Payer: Self-pay

## 2023-06-11 ENCOUNTER — Other Ambulatory Visit (HOSPITAL_COMMUNITY): Payer: Self-pay

## 2023-06-11 DIAGNOSIS — M6281 Muscle weakness (generalized): Secondary | ICD-10-CM | POA: Diagnosis not present

## 2023-06-11 DIAGNOSIS — R2689 Other abnormalities of gait and mobility: Secondary | ICD-10-CM

## 2023-06-11 NOTE — Therapy (Signed)
OUTPATIENT PHYSICAL THERAPY NEURO TREATMENT NOTE   Patient Name: [name] MRN: [id number] DOB:[birth year], 62 y.o., female Today's Date: 06/12/2023   PCP: [place], [name]., MD REFERRING PROVIDER: Ranelle Oyster, MD  END OF SESSION:  PT End of Session - 06/12/23 1600     Visit Number 9    Number of Visits 17    Date for PT Re-Evaluation 06/19/23    Authorization Type [employer]    Authorization Time Period 04-21-23 - 06-27-23    Progress Note Due on Visit 10    PT Start Time 0929    PT Stop Time 1017    PT Time Calculation (min) 48 min    Equipment Utilized During Treatment Gait belt;Other (comment)   Rt toe off AFO   Activity Tolerance Patient tolerated treatment well   limited by RLE weakness   Behavior During Therapy [facility] for tasks assessed/performed                     Past Medical History:  Diagnosis Date   Anxiety    Aortic atherosclerosis (HCC) 04/16/2021   Atypical angina    Back pain    related to spinal stenosis and disc problem, radiates down left buttocks to leg., weakness occ.   Chest pain    a. 03/2015 Cath: nl cors; b. 03/2021 Cor CTA: Ca2+ = 0. Nl Cors.   Chronic pain syndrome    Dyspnea    GERD (gastroesophageal reflux disease)    Grade I diastolic dysfunction    Headache    Hyperlipidemia    Hypertension    Lumbar post-laminectomy syndrome    LVH (left ventricular hypertrophy) 12/15/2020   a. 11/2020 Echo: EF 65-70%, no rwma, sev asymm LVH with IVSd 1.9 cm. No LVOT obs @ rest. Gr1 DD. Triv MR.   PONV (postoperative nausea and vomiting)    Pulmonary nodules    a. 03/2021 CT Chest: 3mm pulm nodules in bilat lower lobes. F/u 1 yr.   Right foot drop    Syncope    a. 11/2020 Zio: No significant arrhythmias.   Vaginal foreign object    "Uses Femring"   Past Surgical History:  Procedure Laterality Date   ABDOMINAL HYSTERECTOMY     ANTERIOR CERVICAL DECOMP/DISCECTOMY FUSION N/A 01/03/2022   Procedure: Anterior Cervical  Decompression Fusion - Cervical four-Cervical five - Cervical five-Cervical six;  Surgeon: Tia Alert, MD;  Location: [facility] OR;  Service: Neurosurgery;  Laterality: N/A;   BIOPSY  12/16/2020   Procedure: BIOPSY;  Surgeon: Meryl Dare, MD;  Location: [facility] ENDOSCOPY;  Service: Endoscopy;;   CARDIAC CATHETERIZATION N/A 04/18/2015   Procedure: Left Heart Cath and Coronary Angiography;  Surgeon: Rinaldo Cloud, MD;  Location: [facility] INVASIVE CV LAB;  Service: Cardiovascular;  Laterality: N/A;   COLONOSCOPY     COLONOSCOPY W/ BIOPSIES AND POLYPECTOMY  2018   ESOPHAGOGASTRODUODENOSCOPY (EGD) WITH PROPOFOL N/A 12/16/2020   Procedure: ESOPHAGOGASTRODUODENOSCOPY (EGD) WITH PROPOFOL;  Surgeon: Meryl Dare, MD;  Location: [facility] ENDOSCOPY;  Service: Endoscopy;  Laterality: N/A;   FOOT SURGERY Bilateral    [facility] "bunion,bone spur, tendon" (1) -6'16, (1)-10'16   HEMATOMA EVACUATION N/A 01/05/2022   Procedure: Cervical Wound Exploration;  Surgeon: Coletta Memos, MD;  Location: [facility] OR;  Service: Neurosurgery;  Laterality: N/A;   IR EPIDUROGRAPHY  07/21/2018   LUMBAR LAMINECTOMY/DECOMPRESSION MICRODISCECTOMY Bilateral 12/28/2015   Procedure: MICRO LUMBAR DECOMPRESSION L4 - L5 BILATERALLY;  Surgeon: Jene Every, MD;  Location: [facility]  ORS;  Service: Orthopedics;  Laterality: Bilateral;   LUMBAR LAMINECTOMY/DECOMPRESSION MICRODISCECTOMY Bilateral 03/04/2018   Procedure: Revision of Microlumbar Decompression Bilateral Lumbar Four-Five;  Surgeon: Jene Every, MD;  Location: [facility] OR;  Service: Orthopedics;  Laterality: Bilateral;  90 mins   SAVORY DILATION N/A 12/16/2020   Procedure: SAVORY DILATION;  Surgeon: Meryl Dare, MD;  Location: [facility] ENDOSCOPY;  Service: Endoscopy;  Laterality: N/A;   SPINAL CORD STIMULATOR INSERTION N/A 09/28/2019   Procedure: THORACIC SPINAL CORD STIMULATOR INSERTION;  Surgeon: Venita Lick, MD;  Location: [facility] OR;  Service: Orthopedics;  Laterality: N/A;  2.5 hrs   SPINAL  CORD STIMULATOR REMOVAL N/A 05/27/2021   Procedure: LUMBAR SPINAL CORD STIMULATOR REMOVAL;  Surgeon: Lucy Chris, MD;  Location: [facility] ORS;  Service: Neurosurgery;  Laterality: N/A;   TUBAL LIGATION     WISDOM TOOTH EXTRACTION     WOUND EXPLORATION N/A 03/04/2018   Procedure: EXPLORATION OF LUMBAR DECOMPRESSION WOUND;  Surgeon: Jene Every, MD;  Location: [facility] OR;  Service: Orthopedics;  Laterality: N/A;   Patient Active Problem List   Diagnosis Date Noted   Frequent falls 04/14/2023   Gastroesophageal reflux disease 04/14/2023   Right upper extremity numbness 09/29/2022   Stage 3a chronic kidney disease (HCC) 09/29/2022   Cervical spondylosis with radiculopathy 01/05/2022   S/P cervical spinal fusion 01/03/2022   Cervical disc disorder with radiculopathy of cervical region 11/27/2021   Uncontrolled pain 05/27/2021   Syncope 05/13/2021   Depression with anxiety 05/13/2021   Chronic diastolic CHF (congestive heart failure) (HCC) 05/13/2021   Chronic back pain 05/13/2021   Chest tightness 05/13/2021   Lower abdominal pain 05/13/2021   Syncope and collapse 01/01/2021   Abnormal echocardiogram 01/01/2021   BPPV (benign paroxysmal positional vertigo), left 12/26/2020   Odynophagia    Dysphagia 12/15/2020   Postural dizziness with presyncope 12/14/2020   Lumbar post-laminectomy syndrome    Chronic pain 09/28/2019   Lumbar spinal stenosis    Radicular pain    Gait disorder    Difficulty in urination 07/13/2018   Back pain 07/12/2018   Hyperlipidemia 04/16/2018   GAD (generalized anxiety disorder) 04/16/2018   AKI (acute kidney injury) (HCC)    Benign essential HTN    Major depression, recurrent (HCC)    Lumbar radiculopathy 03/09/2018   Paraparesis (HCC) 03/09/2018   Essential hypertension    Chronic nonintractable headache    Leukocytosis    Acute blood loss anemia    Postoperative pain    Generalized weakness    Spinal stenosis at L4-L5 level 03/04/2018   Spinal stenosis  of lumbar region 12/28/2015   Chest pain 04/16/2015    ONSET DATE: Referral date 04-01-23  REFERRING DIAG: M54.16 (ICD-10-CM) - Lumbar radiculopathy  THERAPY DIAG:  Muscle weakness (generalized)  Other abnormalities of gait and mobility  Rationale for Evaluation and Treatment: Rehabilitation  SUBJECTIVE:  SUBJECTIVE STATEMENT: Pt reports she is doing OK - didn't do exercises yesterday - ate some heavy food and then was tired; pt reports she feels her right leg is getting stronger Pt accompanied by: self  PERTINENT HISTORY: Frequent falls:  Spinal cord stimulator insertion on 09-28-19; stimulator removal on 05-27-21 with resultant LLE weakness;  decompression and laminectomy on 12/28/15 at L4-L5  with redo decompression surgery on May 9,2019, post-op paraparesis with surgery to remove small hematoma; 07/12/18 fall at work (involved rolling chair) with hospital admission, transfer to CIR with d/c home 08/03/18; HTN, Bil foot surgery; left ventricular hypertrophy, chronic pain syndrome, dyspnea, hypertrophic cardiomyopathy; cervical fusion March 2023;  had carpal tunnel surgery RUE in April 2024:  LVH  PAIN:  06-11-23 Are you having pain?  Pt reports her back "bothers her"- is constant in occurrence; rates pain 4-5/10 intensity; worse with bending over; sleeping on heating pad helps Intensity: 1/10  Location:  low back Description: soreness in low back Duration; varies depending on activity or movement being performed - worse with bending over or pulling leg up to chest Aggravating factors -worse with bending over or pulling leg up to chest Alleviating factor - heating pad helps some; pain medication  PRECAUTIONS: Fall  WEIGHT BEARING RESTRICTIONS: No  FALLS: Has patient fallen in last 6 months? Yes. Number of  falls 3  LIVING ENVIRONMENT: Lives with: lives with their spouse Lives in: House/apartment Stairs: Yes: External: 5 steps; on right going up Has following equipment at home: Single point cane, Wheelchair (manual), and L[place]strand crutch  PLOF: Independent with basic ADLs, Independent with household mobility with device, Independent with community mobility with device, and Independent with transfers  PATIENT GOALS: improve strength Rt leg  OBJECTIVE:   DIAGNOSTIC FINDINGS: IMPRESSION: 1. Interval anterior discectomy and fusion from C4 through C6 with improved patency of the spinal canal and only mild residual foraminal narrowing. 2. Stable mild spinal stenosis and mild to moderate left foraminal narrowing at C3-4. 3. Stable mild spinal stenosis and mild foraminal narrowing bilaterally at C6-7. Mild foraminal narrowing bilaterally at C7-T1. 4. No cord deformity or abnormal cord signal.  No acute findings. No recent MRI of lumbar region  LOWER EXTREMITY MMT:    MMT Right Eval Left Eval  Hip flexion 3+   Hip extension    Hip abduction    Hip adduction    Hip internal rotation    Hip external rotation    Knee flexion 3-   Knee extension 3-   Ankle dorsiflexion 2-   Ankle plantarflexion    Ankle inversion    Ankle eversion    (Blank rows = not tested)   Today's Treatment: 06-11-23    GAIT: Gait pattern: decreased step length- Right, decreased stance time- Right, decreased hip/knee flexion- Right, and decreased ankle dorsiflexion- Right Distance walked: approx. 47' from lobby to high low mat table - SBA:   changed AFO from her Ott[place]ock Walk on to toe off AFO for gait training in clinic;  135' x 1 rep with rollator with use of toe off AFO on RLE Assistive device utilized: rollator  Level of assistance: SBA and CGA Comments: Pt has SIGNIFICANT DECREASED Rt quad strength and muscle endurance; used Rt toe off AFO during PT session on 05-12-23     05-07-23: Gait velocity:  with Ott[place] Walk on AFO - 1"18 secs with rollator = .42 ft/sec   With Toe off AFO 31.9 secs with rollator = 1.03 ft/sec  Today's Treatment:  06-11-23  THEREX: Pt amb. 1 lap then an additional 20' to // bars without requiring seated rest period  5x sit to stand from mat  5 reps with Rt foot on 4" step with min assist  Tap ups to 2" step 10 reps with RLE with bil. UE support on // bars Tap ups to 4" step 5 reps with RLE with bil. UE support on // bars for Rt hip and knee flexor strengthening  Leg press 40# 2 sets 10 reps bil. LE's ; 20# RLE 10 reps  Pt performed closed chain Rt quad strengthening exercise inside // bars - step up exercise onto 4" step with bil. UE support; Rt foot remained on step for entire exercise; pt performed 10 reps, then needed seated rest break;  then able to perform 6 more reps RLE step up with Rt foot remaining on step during exercise to avoid excessive fatigue  Tap ups with LLE onto 4" step for RLE isometric and closed chain strengthening - 10 reps; 10 additional reps tapping straight up, across and back down to floor  LAQ's 10 reps no weight:  then additional  5 reps with 2# RLE - in seated position    HEP - Medbridge  Access Code: [email or web address] URL: [email or web address] Date: 05/06/2023 Prepared by: Maebelle Munroe  Exercises - Beginner Bridge  - 1 x daily - 7 x weekly - 1 sets - 10 reps - 3 hold - Supine Heel Slide  - 1 x daily - 7 x weekly - 1 sets - 10 reps - Bent Knee Fallouts  - 1 x daily - 7 x weekly - 1 sets - 10 reps - Sidelying Hip Abduction  - 1 x daily - 7 x weekly - 1 sets - 10 reps - Seated Knee Flexion Extension AROM   - 1 x daily - 7 x weekly - 1 sets - 10 reps   PATIENT EDUCATION: Education details:  educated pt in recommended AFO (blue rocker) but unable to trial this orthotic as clinic does not have a small Rt blue rocker AFO; trialed toe off to assess benefit of more supportive AFO compared to the one she is currently using  - one with posterior strut only; pt also instructed in HEP - Medbridge [id number] Person educated: Patient Education method: Explanation, demonstration, handouts  Education comprehension: verbalized understanding, demonstrated understanding  HOME EXERCISE PROGRAM: To be issued  GOALS: Goals reviewed with patient? Yes  SHORT TERM GOALS: Target date: 05-22-23  Evaluate for more supportive AFO for RLE and request order as appropriate. Baseline: pt has Ottobock posterior strut AFO Goal status: Goal met 05-07-23  2.  Amb. 47' with RW with SBA without requiring seated rest period. Baseline:   10' with RW on 05-26-23 Goal status: Goal met 05-26-23  3.  Perform 5x sit to stand with bil. UE support with pt demonstrating equal weight bearing on each leg. Baseline: Goal status: Goal met - 05-26-23  4.  Pt will participate in TUG assessment with use of RW. Baseline: too fatigued/weak at eval to complete this test Goal status: Goal met  5.  Negotiate 4 steps with CGA with use of bil. Hand rails Baseline:  Goal status: Goal met 05-26-23  6.  Independent in HEP for RLE strengthening.  Baseline:  Goal status: Goal met 05-26-23    LONG TERM GOALS: Target date: 06-19-23  Amb. 230' with RW with SBA without requiring seated rest period. Baseline:  Goal status: INITIAL  2.  Improve TUG  score by at least 4 secs with use of RW for assistance with ambulation Baseline: TBA Goal status: INITIAL  3.  Perform 5x sit to stand transfers with LUE support only to demo improved strength in RLE.  Baseline:  Goal status: INITIAL  4.  Modified independent step negotiation with use of bil. Hand rails using step by step sequence. Baseline:  Goal status: INITIAL  5.  Obtain new AFO for increased RLE stability to decrease fall risk with gait. Baseline:  Goal status: INITIAL  6.  Independent in updated HEP for RLE strengthening. Baseline:  Goal status: INITIAL  ASSESSMENT:  CLINICAL IMPRESSION: PT  session focused on RLE strengthening in closed chain position and also on gait training with use of Rt toe off AFO.  Pt amb. Further distance in today's session (135') with rollator and toe off AFO on RLE prior to requiring seated rest period.  Pt also able to perform tap ups with RLE onto 4" step with moderate difficulty.  Pt increased weight on leg press to 40# for 2 sets 10 reps.  Weight for RLE unilateral on leg press remains 20#.  Pt continues to have decreased muscle strength and decreased endurance in RLE.   Cont with POC.    OBJECTIVE IMPAIRMENTS: Abnormal gait, decreased balance, decreased coordination, decreased endurance, decreased strength, and impaired tone.   ACTIVITY LIMITATIONS: carrying, bending, squatting, stairs, transfers, and locomotion level  PARTICIPATION LIMITATIONS: cleaning, laundry, shopping, and community activity  PERSONAL FACTORS: Behavior pattern, Past/current experiences, and Time since onset of injury/illness/exacerbation are also affecting patient's functional outcome.   REHAB POTENTIAL: Good  CLINICAL DECISION MAKING: Evolving/moderate complexity  EVALUATION COMPLEXITY: Moderate  PLAN:  PT FREQUENCY: 2x/week  PT DURATION: 8 weeks  PLANNED INTERVENTIONS: Therapeutic exercises, Therapeutic activity, Neuromuscular re-education, Balance training, Gait training, Patient/Family education, Self Care, Stair training, Orthotic/Fit training, DME instructions, and Aquatic Therapy  PLAN FOR NEXT SESSION:  10th visit progress note due next session; check LTG's and renew; continue RLE strengthening   Kary Kos, PT 06/12/2023, 4:01 PM

## 2023-06-12 ENCOUNTER — Encounter: Payer: Self-pay | Admitting: Physical Therapy

## 2023-06-16 ENCOUNTER — Encounter: Payer: Self-pay | Admitting: Physical Therapy

## 2023-06-16 ENCOUNTER — Ambulatory Visit: Payer: PPO | Admitting: Physical Therapy

## 2023-06-16 DIAGNOSIS — M6281 Muscle weakness (generalized): Secondary | ICD-10-CM

## 2023-06-16 DIAGNOSIS — R2689 Other abnormalities of gait and mobility: Secondary | ICD-10-CM

## 2023-06-16 NOTE — Therapy (Signed)
OUTPATIENT PHYSICAL THERAPY NEURO TREATMENT NOTE/10th VISIT PROGRESS NOTE   Progress Note  Reporting Period 04-21-23 to 06-16-23  See note below for Objective Data and Assessment of Progress/Goals.  Thank you for the referral of this patient.    Patient Name: [name] MRN: [id number] DOB:10-26-1961, 62 y.o., female Today's Date: 06/16/2023   PCP: [place], [name]., MD REFERRING PROVIDER: Ranelle Oyster, MD  END OF SESSION:  PT End of Session - 06/16/23 1927     Visit Number 10    Number of Visits 17    Date for PT Re-Evaluation 06/19/23    Authorization Type [employer]    Authorization Time Period 04-21-23 - 06-27-23    Progress Note Due on Visit 10    PT Start Time 0935    PT Stop Time 1016    PT Time Calculation (min) 41 min    Equipment Utilized During Treatment Gait belt;Other (comment)   Rt toe off AFO   Activity Tolerance Patient tolerated treatment well   limited by RLE weakness   Behavior During Therapy [facility] for tasks assessed/performed                      Past Medical History:  Diagnosis Date   Anxiety    Aortic atherosclerosis (HCC) 04/16/2021   Atypical angina    Back pain    related to spinal stenosis and disc problem, radiates down left buttocks to leg., weakness occ.   Chest pain    a. 03/2015 Cath: nl cors; b. 03/2021 Cor CTA: Ca2+ = 0. Nl Cors.   Chronic pain syndrome    Dyspnea    GERD (gastroesophageal reflux disease)    Grade I diastolic dysfunction    Headache    Hyperlipidemia    Hypertension    Lumbar post-laminectomy syndrome    LVH (left ventricular hypertrophy) 12/15/2020   a. 11/2020 Echo: EF 65-70%, no rwma, sev asymm LVH with IVSd 1.9 cm. No LVOT obs @ rest. Gr1 DD. Triv MR.   PONV (postoperative nausea and vomiting)    Pulmonary nodules    a. 03/2021 CT Chest: 3mm pulm nodules in bilat lower lobes. F/u 1 yr.   Right foot drop    Syncope    a. 11/2020 Zio: No significant arrhythmias.   Vaginal foreign  object    "Uses Femring"   Past Surgical History:  Procedure Laterality Date   ABDOMINAL HYSTERECTOMY     ANTERIOR CERVICAL DECOMP/DISCECTOMY FUSION N/A 01/03/2022   Procedure: Anterior Cervical Decompression Fusion - Cervical four-Cervical five - Cervical five-Cervical six;  Surgeon: Tia Alert, MD;  Location: [facility] OR;  Service: Neurosurgery;  Laterality: N/A;   BIOPSY  12/16/2020   Procedure: BIOPSY;  Surgeon: Meryl Dare, MD;  Location: [facility] ENDOSCOPY;  Service: Endoscopy;;   CARDIAC CATHETERIZATION N/A 04/18/2015   Procedure: Left Heart Cath and Coronary Angiography;  Surgeon: Rinaldo Cloud, MD;  Location: [facility] INVASIVE CV LAB;  Service: Cardiovascular;  Laterality: N/A;   COLONOSCOPY     COLONOSCOPY W/ BIOPSIES AND POLYPECTOMY  2018   ESOPHAGOGASTRODUODENOSCOPY (EGD) WITH PROPOFOL N/A 12/16/2020   Procedure: ESOPHAGOGASTRODUODENOSCOPY (EGD) WITH PROPOFOL;  Surgeon: Meryl Dare, MD;  Location: [facility] ENDOSCOPY;  Service: Endoscopy;  Laterality: N/A;   FOOT SURGERY Bilateral    [facility] "bunion,bone spur, tendon" (1) -6'16, (1)-10'16   HEMATOMA EVACUATION N/A 01/05/2022   Procedure: Cervical Wound Exploration;  Surgeon: Coletta Memos, MD;  Location: [facility] OR;  Service:  Neurosurgery;  Laterality: N/A;   IR EPIDUROGRAPHY  07/21/2018   LUMBAR LAMINECTOMY/DECOMPRESSION MICRODISCECTOMY Bilateral 12/28/2015   Procedure: MICRO LUMBAR DECOMPRESSION L4 - L5 BILATERALLY;  Surgeon: Jene Every, MD;  Location: [facility] ORS;  Service: Orthopedics;  Laterality: Bilateral;   LUMBAR LAMINECTOMY/DECOMPRESSION MICRODISCECTOMY Bilateral 03/04/2018   Procedure: Revision of Microlumbar Decompression Bilateral Lumbar Four-Five;  Surgeon: Jene Every, MD;  Location: [facility] OR;  Service: Orthopedics;  Laterality: Bilateral;  90 mins   SAVORY DILATION N/A 12/16/2020   Procedure: SAVORY DILATION;  Surgeon: Meryl Dare, MD;  Location: [facility] ENDOSCOPY;  Service: Endoscopy;  Laterality: N/A;   SPINAL CORD  STIMULATOR INSERTION N/A 09/28/2019   Procedure: THORACIC SPINAL CORD STIMULATOR INSERTION;  Surgeon: Venita Lick, MD;  Location: [facility] OR;  Service: Orthopedics;  Laterality: N/A;  2.5 hrs   SPINAL CORD STIMULATOR REMOVAL N/A 05/27/2021   Procedure: LUMBAR SPINAL CORD STIMULATOR REMOVAL;  Surgeon: Lucy Chris, MD;  Location: [facility] ORS;  Service: Neurosurgery;  Laterality: N/A;   TUBAL LIGATION     WISDOM TOOTH EXTRACTION     WOUND EXPLORATION N/A 03/04/2018   Procedure: EXPLORATION OF LUMBAR DECOMPRESSION WOUND;  Surgeon: Jene Every, MD;  Location: [facility] OR;  Service: Orthopedics;  Laterality: N/A;   Patient Active Problem List   Diagnosis Date Noted   Frequent falls 04/14/2023   Gastroesophageal reflux disease 04/14/2023   Right upper extremity numbness 09/29/2022   Stage 3a chronic kidney disease (HCC) 09/29/2022   Cervical spondylosis with radiculopathy 01/05/2022   S/P cervical spinal fusion 01/03/2022   Cervical disc disorder with radiculopathy of cervical region 11/27/2021   Uncontrolled pain 05/27/2021   Syncope 05/13/2021   Depression with anxiety 05/13/2021   Chronic diastolic CHF (congestive heart failure) (HCC) 05/13/2021   Chronic back pain 05/13/2021   Chest tightness 05/13/2021   Lower abdominal pain 05/13/2021   Syncope and collapse 01/01/2021   Abnormal echocardiogram 01/01/2021   BPPV (benign paroxysmal positional vertigo), left 12/26/2020   Odynophagia    Dysphagia 12/15/2020   Postural dizziness with presyncope 12/14/2020   Lumbar post-laminectomy syndrome    Chronic pain 09/28/2019   Lumbar spinal stenosis    Radicular pain    Gait disorder    Difficulty in urination 07/13/2018   Back pain 07/12/2018   Hyperlipidemia 04/16/2018   GAD (generalized anxiety disorder) 04/16/2018   AKI (acute kidney injury) (HCC)    Benign essential HTN    Major depression, recurrent (HCC)    Lumbar radiculopathy 03/09/2018   Paraparesis (HCC) 03/09/2018   Essential  hypertension    Chronic nonintractable headache    Leukocytosis    Acute blood loss anemia    Postoperative pain    Generalized weakness    Spinal stenosis at L4-L5 level 03/04/2018   Spinal stenosis of lumbar region 12/28/2015   Chest pain 04/16/2015    ONSET DATE: Referral date 04-01-23  REFERRING DIAG: M54.16 (ICD-10-CM) - Lumbar radiculopathy  THERAPY DIAG:  Muscle weakness (generalized)  Other abnormalities of gait and mobility  Rationale for Evaluation and Treatment: Rehabilitation  SUBJECTIVE:  SUBJECTIVE STATEMENT: Pt reports she is tired today - didn't sleep well last night Pt accompanied by: self  PERTINENT HISTORY: Frequent falls:  Spinal cord stimulator insertion on 09-28-19; stimulator removal on 05-27-21 with resultant LLE weakness;  decompression and laminectomy on 12/28/15 at L4-L5  with redo decompression surgery on May 9,2019, post-op paraparesis with surgery to remove small hematoma; 07/12/18 fall at work (involved rolling chair) with hospital admission, transfer to CIR with d/c home 08/03/18; HTN, Bil foot surgery; left ventricular hypertrophy, chronic pain syndrome, dyspnea, hypertrophic cardiomyopathy; cervical fusion March 2023;  had carpal tunnel surgery RUE in April 2024:  LVH  PAIN:  06-16-23 Are you having pain?  Pt reports her back "bothers her"- is constant in occurrence; rates pain 4-5/10 intensity; worse with bending over; sleeping on heating pad helps Intensity: 1-2/10  Location:  low back Description: soreness in low back Duration; varies depending on activity or movement being performed - worse with bending over or pulling leg up to chest Aggravating factors -worse with bending over or pulling leg up to chest Alleviating factor - heating pad helps some; pain  medication  PRECAUTIONS: Fall  WEIGHT BEARING RESTRICTIONS: No  FALLS: Has patient fallen in last 6 months? Yes. Number of falls 3  LIVING ENVIRONMENT: Lives with: lives with their spouse Lives in: House/apartment Stairs: Yes: External: 5 steps; on right going up Has following equipment at home: Single point cane, Wheelchair (manual), and Lofstrand crutch  PLOF: Independent with basic ADLs, Independent with household mobility with device, Independent with community mobility with device, and Independent with transfers  PATIENT GOALS: improve strength Rt leg  OBJECTIVE:   DIAGNOSTIC FINDINGS: IMPRESSION: 1. Interval anterior discectomy and fusion from C4 through C6 with improved patency of the spinal canal and only mild residual foraminal narrowing. 2. Stable mild spinal stenosis and mild to moderate left foraminal narrowing at C3-4. 3. Stable mild spinal stenosis and mild foraminal narrowing bilaterally at C6-7. Mild foraminal narrowing bilaterally at C7-T1. 4. No cord deformity or abnormal cord signal.  No acute findings. No recent MRI of lumbar region  LOWER EXTREMITY MMT:    MMT Right Eval Left Eval  Hip flexion 3+   Hip extension    Hip abduction    Hip adduction    Hip internal rotation    Hip external rotation    Knee flexion 3-   Knee extension 3-   Ankle dorsiflexion 2-   Ankle plantarflexion    Ankle inversion    Ankle eversion    (Blank rows = not tested)   Today's Treatment: 06-16-23    GAIT: Gait pattern: decreased step length- Right, decreased stance time- Right, decreased hip/knee flexion- Right, and decreased ankle dorsiflexion- Right Distance walked: approx. 109' from lobby to high low mat table - SBA:   changed AFO from her Ottobock Walk on to toe off AFO for gait training in clinic;  180' x 1 rep, then 50' x 1 rep with rollator with use of toe off AFO on RLE Assistive device utilized: rollator  Level of assistance: SBA and CGA Comments: Pt  has SIGNIFICANT DECREASED Rt quad strength and muscle endurance; used Rt toe off AFO during PT session on 05-12-23     05-07-23: Gait velocity: with Ott[place]ock Walk on AFO - 1"18 secs with rollator = .42 ft/sec   With Toe off AFO 31.9 secs with rollator = 1.03 ft/sec    THEREX: 5x sit to stand from high/low mat table with RUE support with CGA  Leg press 40# 10 reps bil. LE's ;  30# bil. LE's 10 reps:  20# RLE only 10 reps   NeuroRe-ed: TUG score 35.97 secs with rollator with toe off AFO on RLE   HEP - Medbridge  Access Code: [email or web address] URL: [email or web address] Date: 05/06/2023 Prepared by: Maebelle Munroe  Exercises - Beginner Bridge  - 1 x daily - 7 x weekly - 1 sets - 10 reps - 3 hold - Supine Heel Slide  - 1 x daily - 7 x weekly - 1 sets - 10 reps - Bent Knee Fallouts  - 1 x daily - 7 x weekly - 1 sets - 10 reps - Sidelying Hip Abduction  - 1 x daily - 7 x weekly - 1 sets - 10 reps - Seated Knee Flexion Extension AROM   - 1 x daily - 7 x weekly - 1 sets - 10 reps   PATIENT EDUCATION: Education details:  educated pt in recommended AFO (blue rocker) but unable to trial this orthotic as clinic does not have a small Rt blue rocker AFO; trialed toe off to assess benefit of more supportive AFO compared to the one she is currently using - one with posterior strut only; pt also instructed in HEP - Medbridge [id number] Person educated: Patient Education method: Explanation, demonstration, handouts  Education comprehension: verbalized understanding, demonstrated understanding  HOME EXERCISE PROGRAM: To be issued  GOALS: Goals reviewed with patient? Yes  SHORT TERM GOALS: Target date: 05-22-23  Evaluate for more supportive AFO for RLE and request order as appropriate. Baseline: pt has Ottobock posterior strut AFO Goal status: Goal met 05-07-23  2.  Amb. 64' with RW with SBA without requiring seated rest period. Baseline:   28' with RW on 05-26-23 Goal status: Goal met  05-26-23  3.  Perform 5x sit to stand with bil. UE support with pt demonstrating equal weight bearing on each leg. Baseline: Goal status: Goal met - 05-26-23  4.  Pt will participate in TUG assessment with use of RW. Baseline: too fatigued/weak at eval to complete this test Goal status: Goal met  5.  Negotiate 4 steps with CGA with use of bil. Hand rails Baseline:  Goal status: Goal met 05-26-23  6.  Independent in HEP for RLE strengthening.  Baseline:  Goal status: Goal met 05-26-23    LONG TERM GOALS: Target date: 06-19-23  Amb. 230' with RW with SBA without requiring seated rest period. Baseline: 180', 50' Goal status: Partially met - 06-16-23  2.  Improve TUG score by at least 4 secs with use of RW for assistance with ambulation Baseline: TBA;  46 secs on 7-11;  35.97 secs on 06-16-23 Goal status: Goal met  3.  Perform 5x sit to stand transfers with LUE support only to demo improved strength in RLE.  Baseline:  Goal status: goal met - RUE used   4.  Modified independent step negotiation with use of bil. Hand rails using step by step sequence. Baseline:  Goal status: Not assessed due to RLE fatigue  5.  Obtain new AFO for increased RLE stability to decrease fall risk with gait. Baseline:  Goal status: In progress - delivery scheduled for 06-24-23  6.  Independent in updated HEP for RLE strengthening. Baseline:  Goal status: Goal met 06-16-23  ASSESSMENT:  CLINICAL IMPRESSION: This 10th visit progress note covers dates 04-21-23 - 06-16-23.  Pt has partially met LTG #1 as she ambulated 180' with rollator with use of toe off AFO on  RLE and then an additional 48' after seated rest period.  LTG's #2, 3 and 6 have been met:  LTG #4 not assessed due to RLE fatigue and weakness in today's session and LTG #5 is in progress as blue rocker AFO is scheduled for delivery on 06-24-23.  Pt continues to have RLE weakness and decreased muscle endurance, but is progressing with increasing  strength.  Cont with POC.    OBJECTIVE IMPAIRMENTS: Abnormal gait, decreased balance, decreased coordination, decreased endurance, decreased strength, and impaired tone.   ACTIVITY LIMITATIONS: carrying, bending, squatting, stairs, transfers, and locomotion level  PARTICIPATION LIMITATIONS: cleaning, laundry, shopping, and community activity  PERSONAL FACTORS: Behavior pattern, Past/current experiences, and Time since onset of injury/illness/exacerbation are also affecting patient's functional outcome.   REHAB POTENTIAL: Good  CLINICAL DECISION MAKING: Evolving/moderate complexity  EVALUATION COMPLEXITY: Moderate  PLAN:  PT FREQUENCY: 2x/week  PT DURATION: 8 weeks  PLANNED INTERVENTIONS: Therapeutic exercises, Therapeutic activity, Neuromuscular re-education, Balance training, Gait training, Patient/Family education, Self Care, Stair training, Orthotic/Fit training, DME instructions, and Aquatic Therapy  PLAN FOR NEXT SESSION:  complete renewal; continue RLE strengthening   Kary Kos, PT 06/16/2023, 7:30 PM

## 2023-06-23 ENCOUNTER — Ambulatory Visit (INDEPENDENT_AMBULATORY_CARE_PROVIDER_SITE_OTHER): Payer: PPO

## 2023-06-23 ENCOUNTER — Encounter (HOSPITAL_BASED_OUTPATIENT_CLINIC_OR_DEPARTMENT_OTHER): Payer: Self-pay | Admitting: Internal Medicine

## 2023-06-23 ENCOUNTER — Ambulatory Visit (HOSPITAL_BASED_OUTPATIENT_CLINIC_OR_DEPARTMENT_OTHER): Payer: PPO | Admitting: Internal Medicine

## 2023-06-23 VITALS — BP 129/85 | HR 84 | Ht 65.0 in | Wt 169.0 lb

## 2023-06-23 DIAGNOSIS — I517 Cardiomegaly: Secondary | ICD-10-CM | POA: Diagnosis not present

## 2023-06-23 DIAGNOSIS — I6522 Occlusion and stenosis of left carotid artery: Secondary | ICD-10-CM

## 2023-06-23 DIAGNOSIS — I10 Essential (primary) hypertension: Secondary | ICD-10-CM

## 2023-06-23 DIAGNOSIS — R55 Syncope and collapse: Secondary | ICD-10-CM

## 2023-06-23 DIAGNOSIS — I5032 Chronic diastolic (congestive) heart failure: Secondary | ICD-10-CM

## 2023-06-23 DIAGNOSIS — R42 Dizziness and giddiness: Secondary | ICD-10-CM

## 2023-06-23 NOTE — Patient Instructions (Signed)
Medication Instructions:  Continue current medications  *If you need a refill on your cardiac medications before your next appointment, please call your pharmacy*   Lab Work: None Ordered   Testing/Procedures: None Ordered   Follow-Up: At [facility], you and your health needs are our priority.  As part of our continuing mission to provide you with exceptional heart care, we have created designated Provider Care Teams.  These Care Teams include your primary Cardiologist (physician) and Advanced Practice Providers (APPs -  Physician Assistants and Nurse Practitioners) who all work together to provide you with the care you need, when you need it.  We recommend signing up for the patient portal called "MyChart".  Sign up information is provided on this After Visit Summary.  MyChart is used to connect with patients for Virtual Visits (Telemedicine).  Patients are able to view lab/test results, encounter notes, upcoming appointments, etc.  Non-urgent messages can be sent to your provider as well.   To learn more about what you can do with MyChart, go to [email or web address].    Your next appointment:   6 month(s)  Provider:   K. Italy Hilty, MD    Other Instructions

## 2023-06-23 NOTE — Progress Notes (Signed)
OFFICE NOTE  Chief Complaint:  Follow-up  Primary Care Physician: [name], Cheryl G, MD  HPI:  [name] is a 62 y.o. female with a past medial history significant for hypertension, dyslipidemia, lumbar back pain, anxiety and echo evidence of possible hypertrophic cardiomyopathy.  I had seen her in the past in the hospital but basically she has been followed by our APP's in the office until recently.  She had an echo in February which showed dynamic LVEF 65 to 70% with severe asymmetric left ventricular hypertrophy and a septal dimension of 1.9 cm.  I had recommended a cardiac MRI however this was not performed due to the fact that she has spinal stimulator.  Subsequently she had another echo 2 months later that I did not order but was interpreted as having normal septal thickness.  I personally reviewed the echo and disagree with that finding.  The septum is completely mis-measured.  It actually does measure about 2 cm.  This is consistent with prior findings.  There is family history concerning for hypertrophic cardiomyopathy.  We discussed that in more detail today.  She does have uncontrolled hypertension as well and blood pressure was quite high today 174/102.  She seems to be tolerating carvedilol which was started in the hospital and likely would benefit from more.  With regards to her spinal stimulator, her orthopedic spine surgeon recommended that she could either have the leads replaced with MRI compatible or perhaps have it removed.  She says it actually does not seem to have helped her very much and she is considering having it removed.  03/27/2021  [name] returns today for follow-up.  She continues to have concerns about chest discomfort.  We had discussed pursuing a possible cardiac MRI to evaluate for cardiomyopathy although I do not think these are causing her symptoms.  The issue is that she would have to have her spinal stimulator lead removed and her  orthospine surgeon did not want to do this.  She is sought out another opinion from and may have that performed at an outside hospital.  Blood pressure looks like it continues to be an issue and this may be actually causing a lot of her symptoms.  Of course she could have coronary disease that we have not picked up on as well.  07/10/2021  [name] returns today for follow-up.  Recently she had removal of a spinal stimulator.  She had some difficult recovery from this including a lot of back pain but it seems to be improving.  She was noted to have significant asymmetric septal hypertrophy measuring 1.9 cm by echo in February 2022.  There was no evidence of significant outflow gradient.  Subsequently she had a repeat echo in April.  The septum was measured to 0.65 cm which is surprisingly low and I then personally reviewed the images and noted that it has been missed measured.  The septum actually measures about 1.85 cm which is more consistent with her prior echo.  I suspect that she does have hypertrophic cardiomyopathy.  There is a family history of this and her brother who actually died with it.  He was advised to have septal myomectomy but declined the surgery.  She continues to have some chest discomfort although coronary CT angiography was performed in June 2022 which demonstrated no coronary calcium and no obstructive coronary disease.  We had discussed the definitive cardiac MRI and she said that she is now interested in doing that since  her spinal stimulator leads have been removed.  She did recently have successful MRI of her back.  10/30/2021  [name] returns today for follow-up.  She underwent cardiac MRI on October 31st 2022 which demonstrated mild asymmetric basal septal hypertrophy measuring only 10 mm.  There was an anomalous cord inserting at the basal septum which likely was measured in addition to the septum to provide a larger measurement on echo.  Overall it is  not thought that there is evidence for any hypertrophic cardiomyopathy based on this MRI.  I think this is reassuring and the fact that she had no coronary disease as well by CT makes her dyspnea which is still somewhat persistent, unlikely to be cardiac in origin.  I suspect there may be some degree of deconditioning including issues with chronic pain and having to use a walker.  12/23/2022  [name] is seen today in follow-up.  Recently she has been seen by Gillian Shields, NP for adjustment of her blood pressure medications.  She has had some issues with lower extremity swelling.  This necessitated some decrease in her amlodipine.  She was also on a very high dose of carvedilol 50 mg twice daily which was appropriately cut back to 25 mg twice daily.  Her blood pressure has accordingly gone up somewhat today 150/78.  She is overall done well although after having had recent surgeries including neck surgery with Dr. Yetta Barre.  She has been dealing with issues with carpal tunnel syndrome.  06/23/2023  [name] returns today for follow-up.  In the interim she saw Gillian Shields, NP who made further adjustments in her blood pressure meds.  Today her blood pressure appears to be fairly well-controlled.  She has had some falls and issues with sharp left-sided chest pain into her left breast.  She apparently had some skin changes and possibly an infection.  This was treated by her gynecologist.  I reminded her that she had no detectable coronary artery disease by coronary CT back in 2022.  She did have some mild asymmetric septal hypertrophy.  Her brother had hypertrophic cardiomyopathy and I suspect she might be at risk for that.  She will need follow-up imaging.  PMHx:  Past Medical History:  Diagnosis Date   Anxiety    Aortic atherosclerosis (HCC) 04/16/2021   Atypical angina    Back pain    related to spinal stenosis and disc problem, radiates down left buttocks to leg., weakness  occ.   Chest pain    a. 03/2015 Cath: nl cors; b. 03/2021 Cor CTA: Ca2+ = 0. Nl Cors.   Chronic pain syndrome    Dyspnea    GERD (gastroesophageal reflux disease)    Grade I diastolic dysfunction    Headache    Hyperlipidemia    Hypertension    Lumbar post-laminectomy syndrome    LVH (left ventricular hypertrophy) 12/15/2020   a. 11/2020 Echo: EF 65-70%, no rwma, sev asymm LVH with IVSd 1.9 cm. No LVOT obs @ rest. Gr1 DD. Triv MR.   PONV (postoperative nausea and vomiting)    Pulmonary nodules    a. 03/2021 CT Chest: 3mm pulm nodules in bilat lower lobes. F/u 1 yr.   Right foot drop    Syncope    a. 11/2020 Zio: No significant arrhythmias.   Vaginal foreign object    "Uses Femring"    Past Surgical History:  Procedure Laterality Date   ABDOMINAL HYSTERECTOMY     ANTERIOR CERVICAL DECOMP/DISCECTOMY FUSION N/A 01/03/2022  Procedure: Anterior Cervical Decompression Fusion - Cervical four-Cervical five - Cervical five-Cervical six;  Surgeon: Tia Alert, MD;  Location: [facility] OR;  Service: Neurosurgery;  Laterality: N/A;   BIOPSY  12/16/2020   Procedure: BIOPSY;  Surgeon: Meryl Dare, MD;  Location: [facility] ENDOSCOPY;  Service: Endoscopy;;   CARDIAC CATHETERIZATION N/A 04/18/2015   Procedure: Left Heart Cath and Coronary Angiography;  Surgeon: Rinaldo Cloud, MD;  Location: [facility] INVASIVE CV LAB;  Service: Cardiovascular;  Laterality: N/A;   COLONOSCOPY     COLONOSCOPY W/ BIOPSIES AND POLYPECTOMY  2018   ESOPHAGOGASTRODUODENOSCOPY (EGD) WITH PROPOFOL N/A 12/16/2020   Procedure: ESOPHAGOGASTRODUODENOSCOPY (EGD) WITH PROPOFOL;  Surgeon: Meryl Dare, MD;  Location: [facility] ENDOSCOPY;  Service: Endoscopy;  Laterality: N/A;   FOOT SURGERY Bilateral    [facility] "bunion,bone spur, tendon" (1) -6'16, (1)-10'16   HEMATOMA EVACUATION N/A 01/05/2022   Procedure: Cervical Wound Exploration;  Surgeon: Coletta Memos, MD;  Location: [facility] OR;  Service: Neurosurgery;  Laterality: N/A;   IR  EPIDUROGRAPHY  07/21/2018   LUMBAR LAMINECTOMY/DECOMPRESSION MICRODISCECTOMY Bilateral 12/28/2015   Procedure: MICRO LUMBAR DECOMPRESSION L4 - L5 BILATERALLY;  Surgeon: Jene Every, MD;  Location: [facility] ORS;  Service: Orthopedics;  Laterality: Bilateral;   LUMBAR LAMINECTOMY/DECOMPRESSION MICRODISCECTOMY Bilateral 03/04/2018   Procedure: Revision of Microlumbar Decompression Bilateral Lumbar Four-Five;  Surgeon: Jene Every, MD;  Location: [facility] OR;  Service: Orthopedics;  Laterality: Bilateral;  90 mins   SAVORY DILATION N/A 12/16/2020   Procedure: SAVORY DILATION;  Surgeon: Meryl Dare, MD;  Location: [facility] ENDOSCOPY;  Service: Endoscopy;  Laterality: N/A;   SPINAL CORD STIMULATOR INSERTION N/A 09/28/2019   Procedure: THORACIC SPINAL CORD STIMULATOR INSERTION;  Surgeon: Venita Lick, MD;  Location: [facility] OR;  Service: Orthopedics;  Laterality: N/A;  2.5 hrs   SPINAL CORD STIMULATOR REMOVAL N/A 05/27/2021   Procedure: LUMBAR SPINAL CORD STIMULATOR REMOVAL;  Surgeon: Lucy Chris, MD;  Location: [facility] ORS;  Service: Neurosurgery;  Laterality: N/A;   TUBAL LIGATION     WISDOM TOOTH EXTRACTION     WOUND EXPLORATION N/A 03/04/2018   Procedure: EXPLORATION OF LUMBAR DECOMPRESSION WOUND;  Surgeon: Jene Every, MD;  Location: [facility] OR;  Service: Orthopedics;  Laterality: N/A;    FAMHx:  Family History  Problem Relation Age of Onset   Heart attack Mother    Lung cancer Father    Cancer Father    Pancreatic cancer Sister    Breast cancer Sister 54   Multiple myeloma Sister    Breast cancer Sister        diagnosed in her 74's   Heart attack Sister    Throat cancer Brother    Lung cancer Brother    Stomach cancer Cousin    Colon cancer Neg Hx    Colon polyps Neg Hx    Esophageal cancer Neg Hx    Rectal cancer Neg Hx     SOCHx:   reports that she has never smoked. She has never used smokeless tobacco. She reports that she does not drink alcohol and does not use drugs.  ALLERGIES:   Allergies  Allergen Reactions   Cephalosporins Anaphylaxis   Penicillins Anaphylaxis and Hives    Did it involve swelling of the face/tongue/throat, SOB, or low BP? Yes  Did it involve sudden or severe rash/hives, skin peeling, or any reaction on the inside of your mouth or nose? Yes  Did you need to seek medical attention at a hospital or doctor's office? Yes  When did  it last happen?      10+ years  If all above answers are "NO", may proceed with cephalosporin use.  Did it involve swelling of the face/tongue/throat, SOB, or low BP? Yes Did it involve sudden or severe rash/hives, skin peeling, or any reaction on the inside of your mouth or nose? Yes Did you need to seek medical attention at a hospital or doctor's office? Yes When did it last happen?      10+ years If all above answers are "NO", may proceed with cephalosporin use.   Anesthetics, Amide Nausea And Vomiting    Does not know name of it. States they [facility] it on [facility].    Betadine [Povidone Iodine] Other (See Comments)    "Skin Burn" caused scar on Left buttock   Latex Hives, Itching, Rash and Other (See Comments)   Peach [Prunus Persica] Hives   Claritin [Loratadine]     Rash, pruritis     ROS: Pertinent items noted in HPI and remainder of comprehensive ROS otherwise negative.  HOME MEDS: Current Outpatient Medications on File Prior to Visit  Medication Sig Dispense Refill   acetaminophen (TYLENOL) 500 MG tablet Take 1,000 mg by mouth every 6 (six) hours as needed for headache.     ALPRAZolam (XANAX) 0.5 MG tablet Take 1/2 - 1 tablet by mouth at bedtime as needed for anxiety. 30 tablet 3   amitriptyline (ELAVIL) 25 MG tablet Take 1 tablet (25 mg total) by mouth daily. 90 tablet 3   amLODipine (NORVASC) 5 MG tablet Take 1 tablet (5 mg total) by mouth daily. 90 tablet 2   atorvastatin (LIPITOR) 20 MG tablet Take 1 tablet (20 mg total) by mouth daily. 90 tablet 2   baclofen (LIORESAL) 10 MG tablet Take 1  tablet by mouth 3 (three) times daily as needed.     betamethasone dipropionate (DIPROLENE) 0.05 % ointment Apply as directed to affected area twice a day. Taper use as able as directed. 45 g 3   betamethasone dipropionate 0.05 % cream Apply to itchy areas once daily as directed as needed. Avoid normal skin. 45 g 3   betamethasone dipropionate 0.05 % lotion Apply a small amount to affected area once a day 60 mL 0   busPIRone (BUSPAR) 5 MG tablet Take 1 tablet by mouth 2  times daily. (Patient taking differently: Take 5 mg by mouth 2 (two) times daily as needed (anxiety).) 180 tablet 1   carvedilol (COREG) 25 MG tablet Take 1 tablet (25 mg total) by mouth 2 (two) times daily with a meal. 180 tablet 3   cholecalciferol (VITAMIN D3) 25 MCG (1000 UNIT) tablet Take 1,000 Units by mouth daily.     clotrimazole-betamethasone (LOTRISONE) cream APPLY TO THE AFFECTED AND SURROUNDING AREAS OF SKIN BY TOPICAL ROUTE 2 TIMES PER DAY IN THE MORNING AND EVENING FOR 2 WEEKS 45 g 0   diphenhydrAMINE (BENADRYL) 25 MG tablet Take 25 mg by mouth daily as needed for itching.     Estradiol Acetate (FEMRING) 0.05 MG/24HR RING USE ONE RING VAGINALLY EVERY 3 MONTHS AS DIRECTED 1 each 4   gabapentin (NEURONTIN) 600 MG tablet Take 1 tablet (600 mg total) by mouth 3 (three) times daily. 90 tablet 3   hydrALAZINE (APRESOLINE) 50 MG tablet Take 1 tablet (50 mg total) by mouth in the morning AND 1 tablet (50 mg total) at bedtime. 90 tablet 1   HYDROcodone-acetaminophen (NORCO/VICODIN) 5-325 MG tablet Take 1 tablet by mouth every 6  hours as needed for pain. 20 tablet 0   meclizine (ANTIVERT) 25 MG tablet Take 25 mg by mouth 3 (three) times daily as needed for dizziness.     methocarbamol (ROBAXIN) 500 MG tablet Take 1 tablet (500 mg total) by mouth 3 (three) times daily as needed for muscle spasms. 30 tablet 1   methocarbamol (ROBAXIN) 500 MG tablet Take 1 tablet (500 mg total) by mouth 3 (three) times daily as needed for muscle  spasms. 30 tablet 1   methylPREDNISolone (MEDROL DOSEPAK) 4 MG TBPK tablet Take by mouth as directed on package 21 tablet 0   methylPREDNISolone (MEDROL DOSEPAK) 4 MG TBPK tablet take as directed 21 tablet 0   neomycin-bacitracin-polymyxin (NEOSPORIN) ointment Apply 1 application topically as needed for wound care.     omeprazole (PRILOSEC) 40 MG capsule Take 1 capsule (40 mg total) by mouth daily. 90 capsule 1   ondansetron (ZOFRAN) 4 MG tablet Take 1 tablet (4 mg tablet) 30 - 60 minutes each prep dose 2 tablet 0   oxyCODONE-acetaminophen (PERCOCET) 7.5-325 MG tablet Take 1 tablet by mouth daily as needed for severe pain.     spironolactone (ALDACTONE) 50 MG tablet Take 1 tablet (50 mg total) by mouth daily. 90 tablet 3   telmisartan (MICARDIS) 40 MG tablet      traMADol (ULTRAM) 50 MG tablet Take by mouth.     No current facility-administered medications on file prior to visit.    LABS/IMAGING: No results found. However, due to the size of the patient record, not all encounters were searched. Please check Results Review for a complete set of results. VAS US CAROTID  Result Date: 06/23/2023 Carotid Arterial Duplex Study Patient Name:  [name]  Date of Exam:   06/23/2023 Medical Rec #: [id number]                 Accession #:    [id number] Date of Birth: 1961/03/07                Patient Gender: F Patient Age:   14 years Exam Location:  [facility] Procedure:      VAS US CAROTID Referring Phys: Gillian Shields --------------------------------------------------------------------------------  Indications: Postural dizziness with presyncope. Patient reports dizziness when              sitting up in bed. Patient reports intermittent weakness on one              side of body. Patient denies any other cerebrobascular symptoms. Performing Technologist: Jeryl Columbia RDCS  Examination Guidelines: A complete evaluation includes B-mode imaging, spectral Doppler, color Doppler, and power Doppler  as needed of all accessible portions of each vessel. Bilateral testing is considered an integral part of a complete examination. Limited examinations for reoccurring indications may be performed as noted.  Right Carotid Findings: +----------+--------+--------+--------+------------------+--------+           PSV cm/sEDV cm/sStenosisPlaque DescriptionComments +----------+--------+--------+--------+------------------+--------+ CCA Prox  91      22                                         +----------+--------+--------+--------+------------------+--------+ CCA Mid   88      27                                         +----------+--------+--------+--------+------------------+--------+  CCA Distal79      32                                         +----------+--------+--------+--------+------------------+--------+ ICA Prox  32      11      1-39%   smooth                     +----------+--------+--------+--------+------------------+--------+ ICA Mid   52      24                                tortuous +----------+--------+--------+--------+------------------+--------+ ICA Distal48      20                                tortuous +----------+--------+--------+--------+------------------+--------+ ECA       86      19                                         +----------+--------+--------+--------+------------------+--------+ +----------+--------+-------+----------------+-------------------+           PSV cm/sEDV cmsDescribe        Arm Pressure (mmHG) +----------+--------+-------+----------------+-------------------+ Subclavian127     12     Multiphasic, WNL140                 +----------+--------+-------+----------------+-------------------+ +---------+--------+--+--------+--+---------+ VertebralPSV cm/s45EDV cm/s17Antegrade +---------+--------+--+--------+--+---------+  Left Carotid Findings:  +----------+--------+--------+--------+------------------+--------+           PSV cm/sEDV cm/sStenosisPlaque DescriptionComments +----------+--------+--------+--------+------------------+--------+ CCA Prox  101     24                                         +----------+--------+--------+--------+------------------+--------+ CCA Mid   99      35                                         +----------+--------+--------+--------+------------------+--------+ CCA Distal80      30                                         +----------+--------+--------+--------+------------------+--------+ ICA Prox  78      30      1-39%   heterogenous               +----------+--------+--------+--------+------------------+--------+ ICA Mid   94      38                                tortuous +----------+--------+--------+--------+------------------+--------+ ICA Distal91      45                                tortuous +----------+--------+--------+--------+------------------+--------+ ECA       64      15                                         +----------+--------+--------+--------+------------------+--------+ +----------+--------+--------+----------------+-------------------+  PSV cm/sEDV cm/sDescribe        Arm Pressure (mmHG) +----------+--------+--------+----------------+-------------------+ [id number]      7       Multiphasic, WNL140                 +----------+--------+--------+----------------+-------------------+ +---------+--------+--+--------+--+---------+ VertebralPSV cm/s56EDV cm/s22Antegrade +---------+--------+--+--------+--+---------+   Summary: Right Carotid: Velocities in the right ICA are consistent with a 1-39% stenosis. Left Carotid: Velocities in the left ICA are consistent with a 1-39% stenosis. Vertebrals:  Bilateral vertebral arteries demonstrate antegrade flow. Subclavians: Normal flow hemodynamics were seen in bilateral subclavian               arteries. *See table(s) above for measurements and observations.     Preliminary     LIPID PANEL:    Component Value Date/Time   CHOL 187 04/07/2022 0906   TRIG 48.0 04/07/2022 0906   HDL 61.50 04/07/2022 0906   CHOLHDL 3 04/07/2022 0906   VLDL 9.6 04/07/2022 0906   LDLCALC 116 (H) 04/07/2022 0906     WEIGHTS: Wt Readings from Last 3 Encounters:  06/23/23 169 lb (76.7 kg)  04/01/23 175 lb (79.4 kg)  12/23/22 175 lb 9.6 oz (79.7 kg)    VITALS: BP 129/85 (BP Location: Left Arm, Patient Position: Sitting, Cuff Size: Normal)   Pulse 84   Ht 5\' 5"  (1.651 m)   Wt 169 lb (76.7 kg)   SpO2 97%   BMI 28.12 kg/m   EXAM: General appearance: alert Neck: no carotid bruit, no JVD, and thyroid not enlarged, symmetric, no tenderness/mass/nodules Lungs: clear to auscultation bilaterally Heart: regular rate and rhythm Abdomen: soft, non-tender; bowel sounds normal; no masses,  no organomegaly Extremities: extremities normal, atraumatic, no cyanosis or edema Pulses: 2+ and symmetric Skin: Skin color, texture, turgor normal. No rashes or lesions Neurologic: Grossly normal Psych: Pleasant  EKG: EKG Interpretation Date/Time:  Tuesday June 23 2023 09:19:58 EDT Ventricular Rate:  83 PR Interval:  200 QRS Duration:  86 QT Interval:  380 QTC Calculation: 446 R Axis:   69  Text Interpretation: Normal sinus rhythm Normal ECG When compared with ECG of 13-May-2021 07:04, PREVIOUS ECG IS PRESENT Confirmed by Zoila Shutter [id number]) on 06/23/2023 10:49:30 AM    ASSESSMENT: Chest pain/dyspnea-no evidence of coronary artery disease by CT coronary angiography in 03/2021, minimal aortic atherosclerosis Echocardiographic features of hypertrophic cardiomyopathy, IVSD 19 mm (11/2020), however cardiac MRI did not show HOCM and basal septum measured 10 mm with an anomalous cord (07/2021) No evidence for LVOT obstruction Hypertension No coronary artery disease/coronary calcium by CT coronary  angiography (03/2021) Family history of hypertrophic cardiomyopathy in her brother  PLAN: 1.   [name] has better blood pressure control.  She underwent repeat carotid Dopplers today which showed no significant change based on a preliminary read.  She had no evidence of LVOT obstruction but did have some asymmetric septal hypertrophy by cardiac MRI.  This will need to be followed up on with echo probably next year.    Follow-up with me in 6 months.  Chrystie Nose, MD, [facility], FACP  [place]  [facility]  Medical Director of the [facility]  [facility] Diplomate of the American Board of Clinical Lipidology Attending Cardiologist  Direct Dial: [phone]  Fax: [phone]  Website:  [email or web address] 06/23/2023, 10:49 AM

## 2023-06-24 DIAGNOSIS — M21371 Foot drop, right foot: Secondary | ICD-10-CM | POA: Diagnosis not present

## 2023-06-25 ENCOUNTER — Ambulatory Visit: Payer: PPO | Admitting: Physical Therapy

## 2023-06-25 ENCOUNTER — Other Ambulatory Visit (HOSPITAL_COMMUNITY): Payer: Self-pay

## 2023-06-25 ENCOUNTER — Other Ambulatory Visit: Payer: Self-pay | Admitting: Family Medicine

## 2023-06-25 ENCOUNTER — Encounter: Payer: Self-pay | Admitting: Physical Therapy

## 2023-06-25 DIAGNOSIS — M6281 Muscle weakness (generalized): Secondary | ICD-10-CM

## 2023-06-25 DIAGNOSIS — R2689 Other abnormalities of gait and mobility: Secondary | ICD-10-CM

## 2023-06-25 NOTE — Therapy (Signed)
OUTPATIENT PHYSICAL THERAPY NEURO TREATMENT NOTE   Patient Name: [name] MRN: [id number] DOB:Aug 24, 1961, 62 y.o., female Today's Date: 06/26/2023   PCP: [place], [name]., MD REFERRING PROVIDER: Ranelle Oyster, MD  END OF SESSION:  PT End of Session - 06/25/23 0917     Visit Number 11    Number of Visits 17    Date for PT Re-Evaluation 06/19/23    Authorization Type [employer]    Authorization Time Period 04-21-23 - 06-27-23    Progress Note Due on Visit 10    PT Start Time 0925    PT Stop Time 1015    PT Time Calculation (min) 50 min    Equipment Utilized During Treatment Gait belt    Activity Tolerance Patient tolerated treatment well   limited by RLE weakness   Behavior During Therapy [facility] for tasks assessed/performed                      Past Medical History:  Diagnosis Date   Anxiety    Aortic atherosclerosis (HCC) 04/16/2021   Atypical angina    Back pain    related to spinal stenosis and disc problem, radiates down left buttocks to leg., weakness occ.   Chest pain    a. 03/2015 Cath: nl cors; b. 03/2021 Cor CTA: Ca2+ = 0. Nl Cors.   Chronic pain syndrome    Dyspnea    GERD (gastroesophageal reflux disease)    Grade I diastolic dysfunction    Headache    Hyperlipidemia    Hypertension    Lumbar post-laminectomy syndrome    LVH (left ventricular hypertrophy) 12/15/2020   a. 11/2020 Echo: EF 65-70%, no rwma, sev asymm LVH with IVSd 1.9 cm. No LVOT obs @ rest. Gr1 DD. Triv MR.   PONV (postoperative nausea and vomiting)    Pulmonary nodules    a. 03/2021 CT Chest: 3mm pulm nodules in bilat lower lobes. F/u 1 yr.   Right foot drop    Syncope    a. 11/2020 Zio: No significant arrhythmias.   Vaginal foreign object    "Uses Femring"   Past Surgical History:  Procedure Laterality Date   ABDOMINAL HYSTERECTOMY     ANTERIOR CERVICAL DECOMP/DISCECTOMY FUSION N/A 01/03/2022   Procedure: Anterior Cervical Decompression Fusion - Cervical  four-Cervical five - Cervical five-Cervical six;  Surgeon: Tia Alert, MD;  Location: [facility] OR;  Service: Neurosurgery;  Laterality: N/A;   BIOPSY  12/16/2020   Procedure: BIOPSY;  Surgeon: Meryl Dare, MD;  Location: [facility] ENDOSCOPY;  Service: Endoscopy;;   CARDIAC CATHETERIZATION N/A 04/18/2015   Procedure: Left Heart Cath and Coronary Angiography;  Surgeon: Rinaldo Cloud, MD;  Location: [facility] INVASIVE CV LAB;  Service: Cardiovascular;  Laterality: N/A;   COLONOSCOPY     COLONOSCOPY W/ BIOPSIES AND POLYPECTOMY  2018   ESOPHAGOGASTRODUODENOSCOPY (EGD) WITH PROPOFOL N/A 12/16/2020   Procedure: ESOPHAGOGASTRODUODENOSCOPY (EGD) WITH PROPOFOL;  Surgeon: Meryl Dare, MD;  Location: [name] ENDOSCOPY;  Service: Endoscopy;  Laterality: N/A;   FOOT SURGERY Bilateral    [facility] "bunion,bone spur, tendon" (1) -6'16, (1)-10'16   HEMATOMA EVACUATION N/A 01/05/2022   Procedure: Cervical Wound Exploration;  Surgeon: Coletta Memos, MD;  Location: [facility] OR;  Service: Neurosurgery;  Laterality: N/A;   IR EPIDUROGRAPHY  07/21/2018   LUMBAR LAMINECTOMY/DECOMPRESSION MICRODISCECTOMY Bilateral 12/28/2015   Procedure: MICRO LUMBAR DECOMPRESSION L4 - L5 BILATERALLY;  Surgeon: Jene Every, MD;  Location: [facility] ORS;  Service: Orthopedics;  Laterality: Bilateral;   LUMBAR LAMINECTOMY/DECOMPRESSION MICRODISCECTOMY Bilateral 03/04/2018   Procedure: Revision of Microlumbar Decompression Bilateral Lumbar Four-Five;  Surgeon: Jene Every, MD;  Location: [facility] OR;  Service: Orthopedics;  Laterality: Bilateral;  90 mins   SAVORY DILATION N/A 12/16/2020   Procedure: SAVORY DILATION;  Surgeon: Meryl Dare, MD;  Location: [facility] ENDOSCOPY;  Service: Endoscopy;  Laterality: N/A;   SPINAL CORD STIMULATOR INSERTION N/A 09/28/2019   Procedure: THORACIC SPINAL CORD STIMULATOR INSERTION;  Surgeon: Venita Lick, MD;  Location: [facility] OR;  Service: Orthopedics;  Laterality: N/A;  2.5 hrs   SPINAL CORD STIMULATOR REMOVAL N/A  05/27/2021   Procedure: LUMBAR SPINAL CORD STIMULATOR REMOVAL;  Surgeon: Lucy Chris, MD;  Location: [facility] ORS;  Service: Neurosurgery;  Laterality: N/A;   TUBAL LIGATION     WISDOM TOOTH EXTRACTION     WOUND EXPLORATION N/A 03/04/2018   Procedure: EXPLORATION OF LUMBAR DECOMPRESSION WOUND;  Surgeon: Jene Every, MD;  Location: [facility] OR;  Service: Orthopedics;  Laterality: N/A;   Patient Active Problem List   Diagnosis Date Noted   Frequent falls 04/14/2023   Gastroesophageal reflux disease 04/14/2023   Right upper extremity numbness 09/29/2022   Stage 3a chronic kidney disease (HCC) 09/29/2022   Cervical spondylosis with radiculopathy 01/05/2022   S/P cervical spinal fusion 01/03/2022   Cervical disc disorder with radiculopathy of cervical region 11/27/2021   Uncontrolled pain 05/27/2021   Syncope 05/13/2021   Depression with anxiety 05/13/2021   Chronic diastolic CHF (congestive heart failure) (HCC) 05/13/2021   Chronic back pain 05/13/2021   Chest tightness 05/13/2021   Lower abdominal pain 05/13/2021   Syncope and collapse 01/01/2021   Abnormal echocardiogram 01/01/2021   BPPV (benign paroxysmal positional vertigo), left 12/26/2020   Odynophagia    Dysphagia 12/15/2020   Postural dizziness with presyncope 12/14/2020   Lumbar post-laminectomy syndrome    Chronic pain 09/28/2019   Lumbar spinal stenosis    Radicular pain    Gait disorder    Difficulty in urination 07/13/2018   Back pain 07/12/2018   Hyperlipidemia 04/16/2018   GAD (generalized anxiety disorder) 04/16/2018   AKI (acute kidney injury) (HCC)    Benign essential HTN    Major depression, recurrent (HCC)    Lumbar radiculopathy 03/09/2018   Paraparesis (HCC) 03/09/2018   Essential hypertension    Chronic nonintractable headache    Leukocytosis    Acute blood loss anemia    Postoperative pain    Generalized weakness    Spinal stenosis at L4-L5 level 03/04/2018   Spinal stenosis of lumbar region 12/28/2015    Chest pain 04/16/2015    ONSET DATE: Referral date 04-01-23  REFERRING DIAG: M54.16 (ICD-10-CM) - Lumbar radiculopathy  THERAPY DIAG:  Other abnormalities of gait and mobility  Muscle weakness (generalized)  Rationale for Evaluation and Treatment: Rehabilitation  SUBJECTIVE:  SUBJECTIVE STATEMENT: Pt received her blue rocker AFO from [employer] yesterday - wore it most of the day yesterday after she got it Pt accompanied by: self  PERTINENT HISTORY: Frequent falls:  Spinal cord stimulator insertion on 09-28-19; stimulator removal on 05-27-21 with resultant LLE weakness;  decompression and laminectomy on 12/28/15 at L4-L5  with redo decompression surgery on May 9,2019, post-op paraparesis with surgery to remove small hematoma; 07/12/18 fall at work (involved rolling chair) with hospital admission, transfer to CIR with d/c home 08/03/18; HTN, Bil foot surgery; left ventricular hypertrophy, chronic pain syndrome, dyspnea, hypertrophic cardiomyopathy; cervical fusion March 2023;  had carpal tunnel surgery RUE in April 2024:  LVH  PAIN:  06-25-23 Are you having pain?  Pt reports her back "bothers her"- is constant in occurrence; rates pain 4-5/10 intensity; worse with bending over; sleeping on heating pad helps Intensity: 1/10  Location:  low back Description: soreness in low back Duration; varies depending on activity or movement being performed - worse with bending over or pulling leg up to chest Aggravating factors -worse with bending over or pulling leg up to chest Alleviating factor - heating pad helps some; pain medication  PRECAUTIONS: Fall  WEIGHT BEARING RESTRICTIONS: No  FALLS: Has patient fallen in last 6 months? Yes. Number of falls 3  LIVING ENVIRONMENT: Lives with: lives with their spouse Lives  in: House/apartment Stairs: Yes: External: 5 steps; on right going up Has following equipment at home: Single point cane, Wheelchair (manual), and [place]strand crutch  PLOF: Independent with basic ADLs, Independent with household mobility with device, Independent with community mobility with device, and Independent with transfers  PATIENT GOALS: improve strength Rt leg  OBJECTIVE:   DIAGNOSTIC FINDINGS: IMPRESSION: 1. Interval anterior discectomy and fusion from C4 through C6 with improved patency of the spinal canal and only mild residual foraminal narrowing. 2. Stable mild spinal stenosis and mild to moderate left foraminal narrowing at C3-4. 3. Stable mild spinal stenosis and mild foraminal narrowing bilaterally at C6-7. Mild foraminal narrowing bilaterally at C7-T1. 4. No cord deformity or abnormal cord signal.  No acute findings. No recent MRI of lumbar region  LOWER EXTREMITY MMT:    MMT Right Eval Left Eval  Hip flexion 3+   Hip extension    Hip abduction    Hip adduction    Hip internal rotation    Hip external rotation    Knee flexion 3-   Knee extension 3-   Ankle dorsiflexion 2-   Ankle plantarflexion    Ankle inversion    Ankle eversion    (Blank rows = not tested)   Today's Treatment: 06-25-23    GAIT: Gait pattern: decreased step length- Right, decreased stance time- Right, decreased hip/knee flexion- Right, and decreased ankle dorsiflexion- Right Distance walked: approx. 42' from lobby to high low mat table - SBA;  181', 40' with blue rocker AFO; gait training was performed after pt performed TUG test x 2 reps (total of 40' amb. Distance) Assistive device utilized: rollator  Level of assistance: SBA and CGA Comments: Strap of blue rocker AFO was corrected at start of session due to pt reporting discomfort on back of calf;  strapped was twisted with velcro part of strap touching pt's skin; strap was positioned correctly and pt reported no discomfort    Step training; pt negotiated steps with use of Rt hand rail for ascension - step by step sequence with LLE leading; step by step with use of Lt handrail with LLE leading  05-07-23: Gait velocity: with [place] Walk on AFO - 1"18 secs with rollator = .42 ft/sec   With Toe off AFO 31.9 secs with rollator = 1.03 ft/sec    THEREX:  Leg press 40# 10 reps x 2 sets bil. LE's ;  25# RLE only 10 reps  Pt performed closed chain Rt quad strengthening exercise inside // bars - step up exercise onto 4" step with bil. UE support; Rt foot remained on step for entire exercise; pt performed 6 reps, then needed seated rest break;  then performed 4 additional reps of step up exercise   NeuroRe-ed: TUG score 40.28 secs 1st trial of TUG with use of blue rocker AFO on RLE:  pt turned toward Rt side which increased time and also amb. Past 10' brown line so this score was inaccurate for normal TUG test;  TUG reassessed -2nd trial -  score 22.25 secs with use of rollator with pt turning toward Lt side during test which decreased turn time and improved score compared to 1st trial    HEP - Medbridge  Access Code: [email or web address] URL: [email or web address] Date: 05/06/2023 Prepared by: Maebelle Munroe  Exercises - Beginner Bridge  - 1 x daily - 7 x weekly - 1 sets - 10 reps - 3 hold - Supine Heel Slide  - 1 x daily - 7 x weekly - 1 sets - 10 reps - Bent Knee Fallouts  - 1 x daily - 7 x weekly - 1 sets - 10 reps - Sidelying Hip Abduction  - 1 x daily - 7 x weekly - 1 sets - 10 reps - Seated Knee Flexion Extension AROM   - 1 x daily - 7 x weekly - 1 sets - 10 reps   PATIENT EDUCATION: Education details:  educated pt in recommended AFO (blue rocker) but unable to trial this orthotic as clinic does not have a small Rt blue rocker AFO; trialed toe off to assess benefit of more supportive AFO compared to the one she is currently using - one with posterior strut only; pt also instructed in HEP - Medbridge  [id number] Person educated: Patient Education method: Explanation, demonstration, handouts  Education comprehension: verbalized understanding, demonstrated understanding  HOME EXERCISE PROGRAM: To be issued  GOALS: Goals reviewed with patient? Yes  SHORT TERM GOALS: Target date: 07-24-23   1.   Amb. 230' with RW with SBA without requiring seated rest period. Baseline:  Goal status: Ongoing  2.  Perform sit to stand from mat without UE support to demo improved LE strength. Baseline:    Goal status: NEW  3.  Modified independent step negotiation with use of bil. Hand rails using step by step sequence. Baseline: Goal status: Ongoing  4.   Improve TUG score from 22.25 secs to </= 19 secs with use of rollator.  Baseline:  22.25 secs with rollator & blue rocker AFO on 06-25-23 Goal status: NEW  5.   Stand for 1" without UE support to demo improved standing balance. Baseline:  Goal status: NEW    LONG TERM GOALS: Target date: 06-19-23  Amb. 32' with RW with SBA without requiring seated rest period. Baseline: 25' with RW Goal status: Partially met - 06-23-23  2.  Improve TUG score by at least 4 secs with use of RW for assistance with ambulation Baseline: TBA;  46 secs on 7-11;  35.97 secs on 06-16-23;  22.25 secs with rollator with blue rocker AFO Goal status: Goal met 06-25-23  3.  Perform 5x  sit to stand transfers with LUE support only to demo improved strength in RLE.  Baseline:  Goal status: Goal met 05-29-23 - RUE used   4.  Modified independent step negotiation with use of bil. Hand rails using step by step sequence. Baseline:  Goal status: 06-23-23 -- SBA to supervision needed for safety  5.  Obtain new AFO for increased RLE stability to decrease fall risk with gait. Baseline:  Goal status: In progress - delivery scheduled for 06-24-23; Goal met 06-25-23  6.  Independent in updated HEP for RLE strengthening. Baseline:  Goal status: Goal met 06-16-23   NEW LONG TERM  GOALS:  Target date;  08-21-23    1.  Amb. 300' with RW with blue rocker AFO on RLE with SBA without requiring seated rest period.    Baseline;  180', 40' with rollator    Status:  NEW  2.  Improve TUG score from 22.25 secs to </= 15 secs with use of rollator.  Baseline:  22.25 secs with rollator & blue rocker AFO on 06-25-23  Status:  NEW  3.  Pt will report ability to negotiate steps in her garage at home modified independently for safely entering & exiting home.  Baseline;  SBA to CGA  Status:  NEW  4.  Increase gait velocity to >/= 1.5 ft/sec with use of AFO & rollator for increased gait efficiency.  Baseline:  With Toe off AFO 31.9 secs with rollator = 1.03 ft/sec    Status:  NEW  5.  Independent in updated HEP for RLE strengthening.  Baseline:  Goal status: NEW  ASSESSMENT:  CLINICAL IMPRESSION: Pt received new Blue Rocker AFO for RLE yesterday (on 06-24-23).  Pt has met LTG's #2,3, 5 and 6:  LTG #1 partially met as pt able to amb. 181' with rollator with AFO prior to fatigue, requiring seated rest period.  Goal not fully met as set for distance of 230'.  LTG #4 not met but is ongoing as pt requires SBA for safety with step negotiation.  Pt is progressing well and will continue to benefit from PT to address RLE strength deficits and gait and balance deficits.  Orthotic training with new Blue Rocker AFO needs to be continued, as this orthotic training has just been initiated due to delivery of AFO on 06-24-23.  Cont with POC.    OBJECTIVE IMPAIRMENTS: Abnormal gait, decreased balance, decreased coordination, decreased endurance, decreased strength, and impaired tone.   ACTIVITY LIMITATIONS: carrying, bending, squatting, stairs, transfers, and locomotion level  PARTICIPATION LIMITATIONS: cleaning, laundry, shopping, and community activity  PERSONAL FACTORS: Behavior pattern, Past/current experiences, and Time since onset of injury/illness/exacerbation are also affecting patient's  functional outcome.   REHAB POTENTIAL: Good  CLINICAL DECISION MAKING: Evolving/moderate complexity  EVALUATION COMPLEXITY: Moderate  PLAN:  PT FREQUENCY: 2x/week  PT DURATION: 8 weeks  PLANNED INTERVENTIONS: Therapeutic exercises, Therapeutic activity, Neuromuscular re-education, Balance training, Gait training, Patient/Family education, Self Care, Stair training, Orthotic/Fit training, DME instructions, and Aquatic Therapy  PLAN FOR NEXT SESSION:  continue RLE strengthening   Kary Kos, PT 06/26/2023, 3:58 PM

## 2023-06-26 ENCOUNTER — Other Ambulatory Visit (HOSPITAL_COMMUNITY): Payer: Self-pay

## 2023-06-26 ENCOUNTER — Encounter: Payer: Self-pay | Admitting: Physical Therapy

## 2023-06-26 MED ORDER — BACLOFEN 10 MG PO TABS
10.0000 mg | ORAL_TABLET | Freq: Three times a day (TID) | ORAL | 3 refills | Status: DC | PRN
  Filled 2023-06-26: qty 90, 30d supply, fill #0

## 2023-06-30 ENCOUNTER — Other Ambulatory Visit (HOSPITAL_COMMUNITY): Payer: Self-pay

## 2023-06-30 ENCOUNTER — Ambulatory Visit: Payer: PPO | Attending: Physical Medicine & Rehabilitation | Admitting: Physical Therapy

## 2023-06-30 DIAGNOSIS — R2689 Other abnormalities of gait and mobility: Secondary | ICD-10-CM | POA: Insufficient documentation

## 2023-06-30 DIAGNOSIS — M6281 Muscle weakness (generalized): Secondary | ICD-10-CM | POA: Diagnosis not present

## 2023-06-30 DIAGNOSIS — R2681 Unsteadiness on feet: Secondary | ICD-10-CM | POA: Diagnosis not present

## 2023-06-30 NOTE — Therapy (Signed)
OUTPATIENT PHYSICAL THERAPY NEURO TREATMENT NOTE   Patient Name: [name] MRN: [id number] DOB:02/23/1961, 62 y.o., female Today's Date: 07/01/2023   PCP: [place], [name]., MD REFERRING PROVIDER: Ranelle Oyster, MD  END OF SESSION:  PT End of Session - 07/01/23 1413     Visit Number 12    Number of Visits 27    Date for PT Re-Evaluation 08/27/23    Authorization Type [employer]    Authorization Time Period 04-21-23 - 06-27-23;  06-25-23 - 08-27-23    Progress Note Due on Visit 10    PT Start Time 0935    PT Stop Time 1016    PT Time Calculation (min) 41 min    Equipment Utilized During Treatment Gait belt    Activity Tolerance Patient tolerated treatment well   limited by RLE weakness   Behavior During Therapy [facility] for tasks assessed/performed                       Past Medical History:  Diagnosis Date   Anxiety    Aortic atherosclerosis (HCC) 04/16/2021   Atypical angina    Back pain    related to spinal stenosis and disc problem, radiates down left buttocks to leg., weakness occ.   Chest pain    a. 03/2015 Cath: nl cors; b. 03/2021 Cor CTA: Ca2+ = 0. Nl Cors.   Chronic pain syndrome    Dyspnea    GERD (gastroesophageal reflux disease)    Grade I diastolic dysfunction    Headache    Hyperlipidemia    Hypertension    Lumbar post-laminectomy syndrome    LVH (left ventricular hypertrophy) 12/15/2020   a. 11/2020 Echo: EF 65-70%, no rwma, sev asymm LVH with IVSd 1.9 cm. No LVOT obs @ rest. Gr1 DD. Triv MR.   PONV (postoperative nausea and vomiting)    Pulmonary nodules    a. 03/2021 CT Chest: 3mm pulm nodules in bilat lower lobes. F/u 1 yr.   Right foot drop    Syncope    a. 11/2020 Zio: No significant arrhythmias.   Vaginal foreign object    "Uses Femring"   Past Surgical History:  Procedure Laterality Date   ABDOMINAL HYSTERECTOMY     ANTERIOR CERVICAL DECOMP/DISCECTOMY FUSION N/A 01/03/2022   Procedure: Anterior Cervical  Decompression Fusion - Cervical four-Cervical five - Cervical five-Cervical six;  Surgeon: Tia Alert, MD;  Location: [facility] OR;  Service: Neurosurgery;  Laterality: N/A;   BIOPSY  12/16/2020   Procedure: BIOPSY;  Surgeon: Meryl Dare, MD;  Location: [facility] ENDOSCOPY;  Service: Endoscopy;;   CARDIAC CATHETERIZATION N/A 04/18/2015   Procedure: Left Heart Cath and Coronary Angiography;  Surgeon: Rinaldo Cloud, MD;  Location: [facility] INVASIVE CV LAB;  Service: Cardiovascular;  Laterality: N/A;   COLONOSCOPY     COLONOSCOPY W/ BIOPSIES AND POLYPECTOMY  2018   ESOPHAGOGASTRODUODENOSCOPY (EGD) WITH PROPOFOL N/A 12/16/2020   Procedure: ESOPHAGOGASTRODUODENOSCOPY (EGD) WITH PROPOFOL;  Surgeon: Meryl Dare, MD;  Location: [facility] ENDOSCOPY;  Service: Endoscopy;  Laterality: N/A;   FOOT SURGERY Bilateral    [facility] "bunion,bone spur, tendon" (1) -6'16, (1)-10'16   HEMATOMA EVACUATION N/A 01/05/2022   Procedure: Cervical Wound Exploration;  Surgeon: Coletta Memos, MD;  Location: [facility] OR;  Service: Neurosurgery;  Laterality: N/A;   IR EPIDUROGRAPHY  07/21/2018   LUMBAR LAMINECTOMY/DECOMPRESSION MICRODISCECTOMY Bilateral 12/28/2015   Procedure: MICRO LUMBAR DECOMPRESSION L4 - L5 BILATERALLY;  Surgeon: Jene Every, MD;  Location: [facility]  ORS;  Service: Orthopedics;  Laterality: Bilateral;   LUMBAR LAMINECTOMY/DECOMPRESSION MICRODISCECTOMY Bilateral 03/04/2018   Procedure: Revision of Microlumbar Decompression Bilateral Lumbar Four-Five;  Surgeon: Jene Every, MD;  Location: [facility] OR;  Service: Orthopedics;  Laterality: Bilateral;  90 mins   SAVORY DILATION N/A 12/16/2020   Procedure: SAVORY DILATION;  Surgeon: Meryl Dare, MD;  Location: [facility] ENDOSCOPY;  Service: Endoscopy;  Laterality: N/A;   SPINAL CORD STIMULATOR INSERTION N/A 09/28/2019   Procedure: THORACIC SPINAL CORD STIMULATOR INSERTION;  Surgeon: Venita Lick, MD;  Location: [facility] OR;  Service: Orthopedics;  Laterality: N/A;  2.5 hrs   SPINAL  CORD STIMULATOR REMOVAL N/A 05/27/2021   Procedure: LUMBAR SPINAL CORD STIMULATOR REMOVAL;  Surgeon: Lucy Chris, MD;  Location: [facility] ORS;  Service: Neurosurgery;  Laterality: N/A;   TUBAL LIGATION     WISDOM TOOTH EXTRACTION     WOUND EXPLORATION N/A 03/04/2018   Procedure: EXPLORATION OF LUMBAR DECOMPRESSION WOUND;  Surgeon: Jene Every, MD;  Location: [facility] OR;  Service: Orthopedics;  Laterality: N/A;   Patient Active Problem List   Diagnosis Date Noted   Frequent falls 04/14/2023   Gastroesophageal reflux disease 04/14/2023   Right upper extremity numbness 09/29/2022   Stage 3a chronic kidney disease (HCC) 09/29/2022   Cervical spondylosis with radiculopathy 01/05/2022   S/P cervical spinal fusion 01/03/2022   Cervical disc disorder with radiculopathy of cervical region 11/27/2021   Uncontrolled pain 05/27/2021   Syncope 05/13/2021   Depression with anxiety 05/13/2021   Chronic diastolic CHF (congestive heart failure) (HCC) 05/13/2021   Chronic back pain 05/13/2021   Chest tightness 05/13/2021   Lower abdominal pain 05/13/2021   Syncope and collapse 01/01/2021   Abnormal echocardiogram 01/01/2021   BPPV (benign paroxysmal positional vertigo), left 12/26/2020   Odynophagia    Dysphagia 12/15/2020   Postural dizziness with presyncope 12/14/2020   Lumbar post-laminectomy syndrome    Chronic pain 09/28/2019   Lumbar spinal stenosis    Radicular pain    Gait disorder    Difficulty in urination 07/13/2018   Back pain 07/12/2018   Hyperlipidemia 04/16/2018   GAD (generalized anxiety disorder) 04/16/2018   AKI (acute kidney injury) (HCC)    Benign essential HTN    Major depression, recurrent (HCC)    Lumbar radiculopathy 03/09/2018   Paraparesis (HCC) 03/09/2018   Essential hypertension    Chronic nonintractable headache    Leukocytosis    Acute blood loss anemia    Postoperative pain    Generalized weakness    Spinal stenosis at L4-L5 level 03/04/2018   Spinal stenosis  of lumbar region 12/28/2015   Chest pain 04/16/2015    ONSET DATE: Referral date 04-01-23  REFERRING DIAG: M54.16 (ICD-10-CM) - Lumbar radiculopathy  THERAPY DIAG:  Other abnormalities of gait and mobility  Muscle weakness (generalized)  Rationale for Evaluation and Treatment: Rehabilitation  SUBJECTIVE:  SUBJECTIVE STATEMENT: Pt reports she is getting used to her blue rocker AFO; wore it to church on Sunday - walked in but sat near the back Pt accompanied by: self  PERTINENT HISTORY: Frequent falls:  Spinal cord stimulator insertion on 09-28-19; stimulator removal on 05-27-21 with resultant LLE weakness;  decompression and laminectomy on 12/28/15 at L4-L5  with redo decompression surgery on May 9,2019, post-op paraparesis with surgery to remove small hematoma; 07/12/18 fall at work (involved rolling chair) with hospital admission, transfer to CIR with d/c home 08/03/18; HTN, Bil foot surgery; left ventricular hypertrophy, chronic pain syndrome, dyspnea, hypertrophic cardiomyopathy; cervical fusion March 2023;  had carpal tunnel surgery RUE in April 2024:  LVH  PAIN:  06-30-23 Are you having pain?  Pt reports her back "bothers her"- is constant in occurrence; rates pain 4-5/10 intensity; worse with bending over; sleeping on heating pad helps Intensity: 1/10  Location:  low back Description: soreness in low back Duration; varies depending on activity or movement being performed - worse with bending over or pulling leg up to chest Aggravating factors -worse with bending over or pulling leg up to chest Alleviating factor - heating pad helps some; pain medication  PRECAUTIONS: Fall  WEIGHT BEARING RESTRICTIONS: No  FALLS: Has patient fallen in last 6 months? Yes. Number of falls 3  LIVING ENVIRONMENT: Lives  with: lives with their spouse Lives in: House/apartment Stairs: Yes: External: 5 steps; on right going up Has following equipment at home: Single point cane, Wheelchair (manual), and [place]strand crutch  PLOF: Independent with basic ADLs, Independent with household mobility with device, Independent with community mobility with device, and Independent with transfers  PATIENT GOALS: improve strength Rt leg  OBJECTIVE:   DIAGNOSTIC FINDINGS: IMPRESSION: 1. Interval anterior discectomy and fusion from C4 through C6 with improved patency of the spinal canal and only mild residual foraminal narrowing. 2. Stable mild spinal stenosis and mild to moderate left foraminal narrowing at C3-4. 3. Stable mild spinal stenosis and mild foraminal narrowing bilaterally at C6-7. Mild foraminal narrowing bilaterally at C7-T1. 4. No cord deformity or abnormal cord signal.  No acute findings. No recent MRI of lumbar region  LOWER EXTREMITY MMT:    MMT Right Eval Left Eval  Hip flexion 3+   Hip extension    Hip abduction    Hip adduction    Hip internal rotation    Hip external rotation    Knee flexion 3-   Knee extension 3-   Ankle dorsiflexion 2-   Ankle plantarflexion    Ankle inversion    Ankle eversion    (Blank rows = not tested)   Today's Treatment: 06-30-23    GAIT: Gait pattern: decreased step length- Right, decreased stance time- Right, decreased hip/knee flexion- Right, and decreased ankle dorsiflexion- Right Distance walked: approx. 51' from lobby to high low mat table - SBA;  230' x 1 rep with rollator; approx. 35' from leg press to // bars with CGA to SBA due to RLE fatigued  Assistive device utilized: rollator  Level of assistance: SBA and CGA Comments: Pt's RLE fatigued after amb. Approx. 230' - pt decreased speed of ambulation but did not require seated rest period until 2 laps completed (230')     05-07-23: Gait velocity: with [place] Walk on AFO - 1"18 secs with rollator  = .42 ft/sec   With Toe off AFO 31.9 secs with rollator = 1.03 ft/sec    THEREX:  Leg press 40# 10 reps x 2 sets  bil. LE's ;  25# RLE only 10 reps  Pt performed closed chain Rt quad strengthening exercise inside // bars - step up exercise onto 4" step with bil. UE support; Rt foot remained on step for entire exercise; pt performed 5 reps, then needed seated rest break;  then performed 10 additional reps of step up exercise; performed LAQ's RLE between the 2 sets (AROM 5 reps for stretching of Rt quad)  Tap ups LLE onto 4" step for RLE closed chain strengthening 10 reps - bil. UE support  Pt performed stepping forward/back with LLE for RLE strengthening 10 reps with bil. UE support  Step down exercise from 2" step for Rt quad eccentric strengthening 10 reps with bil. UE support    HEP - Medbridge  Access Code: [email or web address] URL: [email or web address] Date: 05/06/2023 Prepared by: Maebelle Munroe  Exercises - Beginner Bridge  - 1 x daily - 7 x weekly - 1 sets - 10 reps - 3 hold - Supine Heel Slide  - 1 x daily - 7 x weekly - 1 sets - 10 reps - Bent Knee Fallouts  - 1 x daily - 7 x weekly - 1 sets - 10 reps - Sidelying Hip Abduction  - 1 x daily - 7 x weekly - 1 sets - 10 reps - Seated Knee Flexion Extension AROM   - 1 x daily - 7 x weekly - 1 sets - 10 reps   PATIENT EDUCATION: Education details:  educated pt in recommended AFO (blue rocker) but unable to trial this orthotic as clinic does not have a small Rt blue rocker AFO; trialed toe off to assess benefit of more supportive AFO compared to the one she is currently using - one with posterior strut only; pt also instructed in HEP - Medbridge [id number] Person educated: Patient Education method: Explanation, demonstration, handouts  Education comprehension: verbalized understanding, demonstrated understanding  HOME EXERCISE PROGRAM: To be issued  GOALS: Goals reviewed with patient? Yes  SHORT TERM GOALS: Target date:  07-24-23   1.   Amb. 230' with RW with SBA without requiring seated rest period. Baseline:  Goal status: Ongoing  2.  Perform sit to stand from mat without UE support to demo improved LE strength. Baseline:    Goal status: NEW  3.  Modified independent step negotiation with use of bil. Hand rails using step by step sequence. Baseline: Goal status: Ongoing  4.   Improve TUG score from 22.25 secs to </= 19 secs with use of rollator.  Baseline:  22.25 secs with rollator & blue rocker AFO on 06-25-23 Goal status: NEW  5.   Stand for 1" without UE support to demo improved standing balance. Baseline:  Goal status: NEW    LONG TERM GOALS: Target date: 06-19-23  Amb. 76' with RW with SBA without requiring seated rest period. Baseline: 1' with RW Goal status: Partially met - 06-23-23  2.  Improve TUG score by at least 4 secs with use of RW for assistance with ambulation Baseline: TBA;  46 secs on 7-11;  35.97 secs on 06-16-23;  22.25 secs with rollator with blue rocker AFO Goal status: Goal met 06-25-23  3.  Perform 5x sit to stand transfers with LUE support only to demo improved strength in RLE.  Baseline:  Goal status: Goal met 05-29-23 - RUE used   4.  Modified independent step negotiation with use of bil. Hand rails using step by step sequence. Baseline:  Goal status: 06-23-23 -- SBA to  supervision needed for safety  5.  Obtain new AFO for increased RLE stability to decrease fall risk with gait. Baseline:  Goal status: In progress - delivery scheduled for 06-24-23; Goal met 06-25-23  6.  Independent in updated HEP for RLE strengthening. Baseline:  Goal status: Goal met 06-16-23   NEW LONG TERM GOALS:  Target date;  08-21-23    1.  Amb. 300' with RW with blue rocker AFO on RLE with SBA without requiring seated rest period.    Baseline;  180', 40' with rollator    Status:  NEW  2.  Improve TUG score from 22.25 secs to </= 15 secs with use of rollator.  Baseline:  22.25 secs  with rollator & blue rocker AFO on 06-25-23  Status:  NEW  3.  Pt will report ability to negotiate steps in her garage at home modified independently for safely entering & exiting home.  Baseline;  SBA to CGA  Status:  NEW  4.  Increase gait velocity to >/= 1.5 ft/sec with use of AFO & rollator for increased gait efficiency.  Baseline:  With Toe off AFO 31.9 secs with rollator = 1.03 ft/sec    Status:  NEW  5.  Independent in updated HEP for RLE strengthening.  Baseline:  Goal status: NEW  ASSESSMENT:  CLINICAL IMPRESSION: PT session focused on gait training with blue rocker AFO on RLE and on RLE strengthening.  Pt able to amb. 230' prior to needing seated rest period, with RLE becoming fatigued after amb. approx. 200'.  Pt able to perform step down exercise from 2" step for first time in today's session.  Pt reported moderate fatigue in RLE after performing leg press exercise.  Cont with POC.    OBJECTIVE IMPAIRMENTS: Abnormal gait, decreased balance, decreased coordination, decreased endurance, decreased strength, and impaired tone.   ACTIVITY LIMITATIONS: carrying, bending, squatting, stairs, transfers, and locomotion level  PARTICIPATION LIMITATIONS: cleaning, laundry, shopping, and community activity  PERSONAL FACTORS: Behavior pattern, Past/current experiences, and Time since onset of injury/illness/exacerbation are also affecting patient's functional outcome.   REHAB POTENTIAL: Good  CLINICAL DECISION MAKING: Evolving/moderate complexity  EVALUATION COMPLEXITY: Moderate  PLAN:  PT FREQUENCY: 2x/week  PT DURATION: 8 weeks  PLANNED INTERVENTIONS: Therapeutic exercises, Therapeutic activity, Neuromuscular re-education, Balance training, Gait training, Patient/Family education, Self Care, Stair training, Orthotic/Fit training, DME instructions, and Aquatic Therapy  PLAN FOR NEXT SESSION:  continue RLE strengthening   Kary Kos, PT 07/01/2023, 2:16  PM

## 2023-07-01 ENCOUNTER — Encounter: Payer: Self-pay | Admitting: Physical Therapy

## 2023-07-02 ENCOUNTER — Ambulatory Visit: Payer: PPO | Admitting: Physical Therapy

## 2023-07-07 ENCOUNTER — Ambulatory Visit: Payer: PPO | Admitting: Physical Therapy

## 2023-07-07 DIAGNOSIS — R2689 Other abnormalities of gait and mobility: Secondary | ICD-10-CM | POA: Diagnosis not present

## 2023-07-07 DIAGNOSIS — M6281 Muscle weakness (generalized): Secondary | ICD-10-CM

## 2023-07-07 NOTE — Therapy (Unsigned)
OUTPATIENT PHYSICAL THERAPY NEURO TREATMENT NOTE   Patient Name: [name] MRN: [id number] DOB:Dec 05, 1960, 62 y.o., female Today's Date: 07/08/2023   PCP: [place], [name]., MD REFERRING PROVIDER: Ranelle Oyster, MD  END OF SESSION:  PT End of Session - 07/08/23 1846     Visit Number 13    Number of Visits 27    Date for PT Re-Evaluation 08/27/23    Authorization Type [employer]    Authorization Time Period 04-21-23 - 06-27-23;  06-25-23 - 08-27-23    Progress Note Due on Visit 10    PT Start Time 0934    PT Stop Time 1020    PT Time Calculation (min) 46 min    Equipment Utilized During Treatment Gait belt    Activity Tolerance Patient tolerated treatment well   limited by RLE weakness   Behavior During Therapy [facility] for tasks assessed/performed                        Past Medical History:  Diagnosis Date   Anxiety    Aortic atherosclerosis (HCC) 04/16/2021   Atypical angina    Back pain    related to spinal stenosis and disc problem, radiates down left buttocks to leg., weakness occ.   Chest pain    a. 03/2015 Cath: nl cors; b. 03/2021 Cor CTA: Ca2+ = 0. Nl Cors.   Chronic pain syndrome    Dyspnea    GERD (gastroesophageal reflux disease)    Grade I diastolic dysfunction    Headache    Hyperlipidemia    Hypertension    Lumbar post-laminectomy syndrome    LVH (left ventricular hypertrophy) 12/15/2020   a. 11/2020 Echo: EF 65-70%, no rwma, sev asymm LVH with IVSd 1.9 cm. No LVOT obs @ rest. Gr1 DD. Triv MR.   PONV (postoperative nausea and vomiting)    Pulmonary nodules    a. 03/2021 CT Chest: 3mm pulm nodules in bilat lower lobes. F/u 1 yr.   Right foot drop    Syncope    a. 11/2020 Zio: No significant arrhythmias.   Vaginal foreign object    "Uses Femring"   Past Surgical History:  Procedure Laterality Date   ABDOMINAL HYSTERECTOMY     ANTERIOR CERVICAL DECOMP/DISCECTOMY FUSION N/A 01/03/2022   Procedure: Anterior Cervical  Decompression Fusion - Cervical four-Cervical five - Cervical five-Cervical six;  Surgeon: Tia Alert, MD;  Location: [facility] OR;  Service: Neurosurgery;  Laterality: N/A;   BIOPSY  12/16/2020   Procedure: BIOPSY;  Surgeon: Meryl Dare, MD;  Location: [facility] ENDOSCOPY;  Service: Endoscopy;;   CARDIAC CATHETERIZATION N/A 04/18/2015   Procedure: Left Heart Cath and Coronary Angiography;  Surgeon: Rinaldo Cloud, MD;  Location: [facility] INVASIVE CV LAB;  Service: Cardiovascular;  Laterality: N/A;   COLONOSCOPY     COLONOSCOPY W/ BIOPSIES AND POLYPECTOMY  2018   ESOPHAGOGASTRODUODENOSCOPY (EGD) WITH PROPOFOL N/A 12/16/2020   Procedure: ESOPHAGOGASTRODUODENOSCOPY (EGD) WITH PROPOFOL;  Surgeon: Meryl Dare, MD;  Location: [facility] ENDOSCOPY;  Service: Endoscopy;  Laterality: N/A;   FOOT SURGERY Bilateral    [facility] "bunion,bone spur, tendon" (1) -6'16, (1)-10'16   HEMATOMA EVACUATION N/A 01/05/2022   Procedure: Cervical Wound Exploration;  Surgeon: Coletta Memos, MD;  Location: [facility] OR;  Service: Neurosurgery;  Laterality: N/A;   IR EPIDUROGRAPHY  07/21/2018   LUMBAR LAMINECTOMY/DECOMPRESSION MICRODISCECTOMY Bilateral 12/28/2015   Procedure: MICRO LUMBAR DECOMPRESSION L4 - L5 BILATERALLY;  Surgeon: Jene Every, MD;  Location:  [facility] ORS;  Service: Orthopedics;  Laterality: Bilateral;   LUMBAR LAMINECTOMY/DECOMPRESSION MICRODISCECTOMY Bilateral 03/04/2018   Procedure: Revision of Microlumbar Decompression Bilateral Lumbar Four-Five;  Surgeon: Jene Every, MD;  Location: [facility] OR;  Service: Orthopedics;  Laterality: Bilateral;  90 mins   SAVORY DILATION N/A 12/16/2020   Procedure: SAVORY DILATION;  Surgeon: Meryl Dare, MD;  Location: [facility] ENDOSCOPY;  Service: Endoscopy;  Laterality: N/A;   SPINAL CORD STIMULATOR INSERTION N/A 09/28/2019   Procedure: THORACIC SPINAL CORD STIMULATOR INSERTION;  Surgeon: Venita Lick, MD;  Location: [facility] OR;  Service: Orthopedics;  Laterality: N/A;  2.5 hrs   SPINAL  CORD STIMULATOR REMOVAL N/A 05/27/2021   Procedure: LUMBAR SPINAL CORD STIMULATOR REMOVAL;  Surgeon: Lucy Chris, MD;  Location: [facility] ORS;  Service: Neurosurgery;  Laterality: N/A;   TUBAL LIGATION     WISDOM TOOTH EXTRACTION     WOUND EXPLORATION N/A 03/04/2018   Procedure: EXPLORATION OF LUMBAR DECOMPRESSION WOUND;  Surgeon: Jene Every, MD;  Location: [facility] OR;  Service: Orthopedics;  Laterality: N/A;   Patient Active Problem List   Diagnosis Date Noted   Frequent falls 04/14/2023   Gastroesophageal reflux disease 04/14/2023   Right upper extremity numbness 09/29/2022   Stage 3a chronic kidney disease (HCC) 09/29/2022   Cervical spondylosis with radiculopathy 01/05/2022   S/P cervical spinal fusion 01/03/2022   Cervical disc disorder with radiculopathy of cervical region 11/27/2021   Uncontrolled pain 05/27/2021   Syncope 05/13/2021   Depression with anxiety 05/13/2021   Chronic diastolic CHF (congestive heart failure) (HCC) 05/13/2021   Chronic back pain 05/13/2021   Chest tightness 05/13/2021   Lower abdominal pain 05/13/2021   Syncope and collapse 01/01/2021   Abnormal echocardiogram 01/01/2021   BPPV (benign paroxysmal positional vertigo), left 12/26/2020   Odynophagia    Dysphagia 12/15/2020   Postural dizziness with presyncope 12/14/2020   Lumbar post-laminectomy syndrome    Chronic pain 09/28/2019   Lumbar spinal stenosis    Radicular pain    Gait disorder    Difficulty in urination 07/13/2018   Back pain 07/12/2018   Hyperlipidemia 04/16/2018   GAD (generalized anxiety disorder) 04/16/2018   AKI (acute kidney injury) (HCC)    Benign essential HTN    Major depression, recurrent (HCC)    Lumbar radiculopathy 03/09/2018   Paraparesis (HCC) 03/09/2018   Essential hypertension    Chronic nonintractable headache    Leukocytosis    Acute blood loss anemia    Postoperative pain    Generalized weakness    Spinal stenosis at L4-L5 level 03/04/2018   Spinal stenosis  of lumbar region 12/28/2015   Chest pain 04/16/2015    ONSET DATE: Referral date 04-01-23  REFERRING DIAG: M54.16 (ICD-10-CM) - Lumbar radiculopathy  THERAPY DIAG:  Other abnormalities of gait and mobility  Muscle weakness (generalized)  Rationale for Evaluation and Treatment: Rehabilitation  SUBJECTIVE:  SUBJECTIVE STATEMENT: Pt [facility] brother - says "this brace was great" - says she did not have to stop and rest- "I just walked slowly"; pt states she is weak today because she was sick over the weekend Pt accompanied by: self  PERTINENT HISTORY: Frequent falls:  Spinal cord stimulator insertion on 09-28-19; stimulator removal on 05-27-21 with resultant LLE weakness;  decompression and laminectomy on 12/28/15 at L4-L5  with redo decompression surgery on May 9,2019, post-op paraparesis with surgery to remove small hematoma; 07/12/18 fall at work (involved rolling chair) with hospital admission, transfer to CIR with d/c home 08/03/18; HTN, Bil foot surgery; left ventricular hypertrophy, chronic pain syndrome, dyspnea, hypertrophic cardiomyopathy; cervical fusion March 2023;  had carpal tunnel surgery RUE in April 2024:  LVH  PAIN:  07-07-23 Are you having pain?  Pt reports her back "bothers her"- is constant in occurrence; rates pain 4-5/10 intensity; worse with bending over; sleeping on heating pad helps Intensity: 1/10  Location:  low back Description: soreness in low back Duration; varies depending on activity or movement being performed - worse with bending over or pulling leg up to chest Aggravating factors -worse with bending over or pulling leg up to chest Alleviating factor - heating pad helps some; pain medication  PRECAUTIONS:  Fall  WEIGHT BEARING RESTRICTIONS: No  FALLS: Has patient fallen in last 6 months? Yes. Number of falls 3  LIVING ENVIRONMENT: Lives with: lives with their spouse Lives in: House/apartment Stairs: Yes: External: 5 steps; on right going up Has following equipment at home: Single point cane, Wheelchair (manual), and [place]strand crutch  PLOF: Independent with basic ADLs, Independent with household mobility with device, Independent with community mobility with device, and Independent with transfers  PATIENT GOALS: improve strength Rt leg  OBJECTIVE:   DIAGNOSTIC FINDINGS: IMPRESSION: 1. Interval anterior discectomy and fusion from C4 through C6 with improved patency of the spinal canal and only mild residual foraminal narrowing. 2. Stable mild spinal stenosis and mild to moderate left foraminal narrowing at C3-4. 3. Stable mild spinal stenosis and mild foraminal narrowing bilaterally at C6-7. Mild foraminal narrowing bilaterally at C7-T1. 4. No cord deformity or abnormal cord signal.  No acute findings. No recent MRI of lumbar region  LOWER EXTREMITY MMT:    MMT Right Eval Left Eval  Hip flexion 3+   Hip extension    Hip abduction    Hip adduction    Hip internal rotation    Hip external rotation    Knee flexion 3-   Knee extension 3-   Ankle dorsiflexion 2-   Ankle plantarflexion    Ankle inversion    Ankle eversion    (Blank rows = not tested)   Today's Treatment: 07-07-23    GAIT: Gait pattern: decreased step length- Right, decreased stance time- Right, decreased hip/knee flexion- Right, and decreased ankle dorsiflexion- Right Distance walked: approx. 125' from lobby to mat table - SBA;  clinic distances during session Assistive device utilized: rollator  Level of assistance: SBA and CGA Comments: Pt's RLE fatigued after amb. Approx. 230' - pt decreased speed of ambulation but did not require seated rest period until 2 laps completed (230')     05-07-23: Gait  velocity: with [place] Walk on AFO - 1"18 secs with rollator = .42 ft/sec   With Toe off AFO 31.9 secs with rollator = 1.03 ft/sec    THEREX:   RLE strengthening exercises;  Step up exercise onto 6" step placed inside // bars; pt performed 5 reps (1st set) and 6 reps 2nd set with bil. UE support on // bars;  Rt foot remained on step for entire exercise during each set  Tap ups onto 6" step with LLE 5 reps for RLE closed chain strengthening with bil. UE support  Step down 10 reps from 2" step inside // bars with bil. UE support  Squats 5 reps; 7 reps with bil. UE support on // bars  Leg press 30# 10 reps bil. LE's ;  20# RLE only 20 reps       HEP - Medbridge  Access Code: [email or web address] URL: [email or web address] Date: 05/06/2023 Prepared by: Maebelle Munroe  Exercises - Beginner Bridge  - 1 x daily - 7 x weekly - 1 sets - 10 reps - 3 hold - Supine Heel Slide  - 1 x daily - 7 x weekly - 1 sets - 10 reps - Bent Knee Fallouts  - 1 x daily - 7 x weekly - 1 sets - 10 reps - Sidelying Hip Abduction  - 1 x daily - 7 x weekly - 1 sets - 10 reps - Seated Knee Flexion Extension AROM   - 1 x daily - 7 x weekly - 1 sets - 10 reps   PATIENT EDUCATION: Education details:  educated pt in recommended AFO (blue rocker) but unable to trial this orthotic as clinic does not have a small Rt blue rocker AFO; trialed toe off to assess benefit of more supportive AFO compared to the one she is currently using - one with posterior strut only; pt also instructed in HEP - Medbridge [id number] Person educated: Patient Education method: Explanation, demonstration, handouts  Education comprehension: verbalized understanding, demonstrated understanding  HOME EXERCISE PROGRAM: To be issued  GOALS: Goals reviewed with patient? Yes  SHORT TERM GOALS: Target date: 07-24-23   1.   Amb. 230' with RW with SBA without requiring seated rest period. Baseline:  Goal status: Ongoing  2.  Perform sit  to stand from mat without UE support to demo improved LE strength. Baseline:    Goal status: NEW  3.  Modified independent step negotiation with use of bil. Hand rails using step by step sequence. Baseline: Goal status: Ongoing  4.   Improve TUG score from 22.25 secs to </= 19 secs with use of rollator.  Baseline:  22.25 secs with rollator & blue rocker AFO on 06-25-23 Goal status: NEW  5.   Stand for 1" without UE support to demo improved standing balance. Baseline:  Goal status: NEW    LONG TERM GOALS: Target date: 06-19-23  Amb. 81' with RW with SBA without requiring seated rest period. Baseline: 31' with RW Goal status: Partially met - 06-23-23  2.  Improve TUG score by at least 4 secs with use of RW for assistance with ambulation Baseline: TBA;  46 secs on 7-11;  35.97 secs on 06-16-23;  22.25 secs with rollator with blue rocker AFO Goal status: Goal met 06-25-23  3.  Perform 5x sit to stand transfers with LUE support only to demo improved strength in RLE.  Baseline:  Goal status: Goal met 05-29-23 - RUE used   4.  Modified independent step negotiation with use of bil. Hand rails using step by step sequence. Baseline:  Goal status: 06-23-23 -- SBA to supervision needed for safety  5.  Obtain new AFO for increased RLE stability to decrease fall risk with gait. Baseline:  Goal status: In progress - delivery scheduled for 06-24-23; Goal met 06-25-23  6.  Independent in updated HEP for RLE strengthening. Baseline:  Goal status: Goal met 06-16-23   NEW LONG TERM GOALS:  Target date;  08-21-23    1.  Amb. 300' with RW with blue rocker AFO on RLE with SBA without requiring seated rest period.    Baseline;  180', 40' with rollator    Status:  NEW  2.  Improve TUG score from 22.25 secs to </= 15 secs with use of rollator.  Baseline:  22.25 secs with rollator & blue rocker AFO on 06-25-23  Status:  NEW  3.  Pt will report ability to negotiate steps in her garage at home  modified independently for safely entering & exiting home.  Baseline;  SBA to CGA  Status:  NEW  4.  Increase gait velocity to >/= 1.5 ft/sec with use of AFO & rollator for increased gait efficiency.  Baseline:  With Toe off AFO 31.9 secs with rollator = 1.03 ft/sec    Status:  NEW  5.  Independent in updated HEP for RLE strengthening.  Baseline:  Goal status: NEW  ASSESSMENT:  CLINICAL IMPRESSION: PT session focused on gait training with blue rocker AFO on RLE and on RLE strengthening.  Decreased muscle endurance with increased fatigue noted in today's session due to pt having been sick for past 2 days with inactivity reported.  Step height for RLE step ups for Rt quad strengthening was increased from 4" to 6" in today's session.  Cont with POC.    OBJECTIVE IMPAIRMENTS: Abnormal gait, decreased balance, decreased coordination, decreased endurance, decreased strength, and impaired tone.   ACTIVITY LIMITATIONS: carrying, bending, squatting, stairs, transfers, and locomotion level  PARTICIPATION LIMITATIONS: cleaning, laundry, shopping, and community activity  PERSONAL FACTORS: Behavior pattern, Past/current experiences, and Time since onset of injury/illness/exacerbation are also affecting patient's functional outcome.   REHAB POTENTIAL: Good  CLINICAL DECISION MAKING: Evolving/moderate complexity  EVALUATION COMPLEXITY: Moderate  PLAN:  PT FREQUENCY: 2x/week  PT DURATION: 8 weeks  PLANNED INTERVENTIONS: Therapeutic exercises, Therapeutic activity, Neuromuscular re-education, Balance training, Gait training, Patient/Family education, Self Care, Stair training, Orthotic/Fit training, DME instructions, and Aquatic Therapy  PLAN FOR NEXT SESSION:  continue RLE strengthening   Kary Kos, PT 07/08/2023, 6:47 PM

## 2023-07-08 ENCOUNTER — Encounter: Payer: Self-pay | Admitting: Physical Therapy

## 2023-07-09 ENCOUNTER — Ambulatory Visit: Payer: PPO | Admitting: Physical Therapy

## 2023-07-09 DIAGNOSIS — M6281 Muscle weakness (generalized): Secondary | ICD-10-CM

## 2023-07-09 DIAGNOSIS — R2689 Other abnormalities of gait and mobility: Secondary | ICD-10-CM

## 2023-07-09 NOTE — Therapy (Signed)
OUTPATIENT PHYSICAL THERAPY NEURO TREATMENT NOTE   Patient Name: [name] MRN: [id number] DOB:Jun 05, 1961, 62 y.o., female Today's Date: 07/10/2023   PCP: [place], [name]., MD REFERRING PROVIDER: Ranelle Oyster, MD  END OF SESSION:  PT End of Session - 07/10/23 1458     Visit Number 14    Number of Visits 27    Date for PT Re-Evaluation 08/27/23    Authorization Type [employer]    Authorization Time Period 04-21-23 - 06-27-23;  06-25-23 - 08-27-23    Progress Note Due on Visit 10    PT Start Time 1448    PT Stop Time 1530    PT Time Calculation (min) 42 min    Equipment Utilized During Treatment Gait belt    Activity Tolerance Patient tolerated treatment well   limited by RLE weakness   Behavior During Therapy WFL for tasks assessed/performed                         Past Medical History:  Diagnosis Date   Anxiety    Aortic atherosclerosis (HCC) 04/16/2021   Atypical angina    Back pain    related to spinal stenosis and disc problem, radiates down left buttocks to leg., weakness occ.   Chest pain    a. 03/2015 Cath: nl cors; b. 03/2021 Cor CTA: Ca2+ = 0. Nl Cors.   Chronic pain syndrome    Dyspnea    GERD (gastroesophageal reflux disease)    Grade I diastolic dysfunction    Headache    Hyperlipidemia    Hypertension    Lumbar post-laminectomy syndrome    LVH (left ventricular hypertrophy) 12/15/2020   a. 11/2020 Echo: EF 65-70%, no rwma, sev asymm LVH with IVSd 1.9 cm. No LVOT obs @ rest. Gr1 DD. Triv MR.   PONV (postoperative nausea and vomiting)    Pulmonary nodules    a. 03/2021 CT Chest: 3mm pulm nodules in bilat lower lobes. F/u 1 yr.   Right foot drop    Syncope    a. 11/2020 Zio: No significant arrhythmias.   Vaginal foreign object    "Uses Femring"   Past Surgical History:  Procedure Laterality Date   ABDOMINAL HYSTERECTOMY     ANTERIOR CERVICAL DECOMP/DISCECTOMY FUSION N/A 01/03/2022   Procedure: Anterior Cervical  Decompression Fusion - Cervical four-Cervical five - Cervical five-Cervical six;  Surgeon: Tia Alert, MD;  Location: [facility] OR;  Service: Neurosurgery;  Laterality: N/A;   BIOPSY  12/16/2020   Procedure: BIOPSY;  Surgeon: Meryl Dare, MD;  Location: [facility] ENDOSCOPY;  Service: Endoscopy;;   CARDIAC CATHETERIZATION N/A 04/18/2015   Procedure: Left Heart Cath and Coronary Angiography;  Surgeon: Rinaldo Cloud, MD;  Location[facility] INVASIVE CV LAB;  Service: Cardiovascular;  Laterality: N/A;   COLONOSCOPY     COLONOSCOPY W/ BIOPSIES AND POLYPECTOMY  2018   ESOPHAGOGASTRODUODENOSCOPY (EGD) WITH PROPOFOL N/A 12/16/2020   Procedure: ESOPHAGOGASTRODUODENOSCOPY (EGD) WITH PROPOFOL;  Surgeon: Meryl Dare, MD;  Location: [facility] ENDOSCOPY;  Service: Endoscopy;  Laterality: N/A;   FOOT SURGERY Bilateral    [facility] "bunion,bone spur, tendon" (1) -6'16, (1)-10'16   HEMATOMA EVACUATION N/A 01/05/2022   Procedure: Cervical Wound Exploration;  Surgeon: Coletta Memos, MD;  Location: [facility] OR;  Service: Neurosurgery;  Laterality: N/A;   IR EPIDUROGRAPHY  07/21/2018   LUMBAR LAMINECTOMY/DECOMPRESSION MICRODISCECTOMY Bilateral 12/28/2015   Procedure: MICRO LUMBAR DECOMPRESSION L4 - L5 BILATERALLY;  Surgeon: Jene Every, MD;  Location: [facility] ORS;  Service: Orthopedics;  Laterality: Bilateral;   LUMBAR LAMINECTOMY/DECOMPRESSION MICRODISCECTOMY Bilateral 03/04/2018   Procedure: Revision of Microlumbar Decompression Bilateral Lumbar Four-Five;  Surgeon: Jene Every, MD;  Location: [facility] OR;  Service: Orthopedics;  Laterality: Bilateral;  90 mins   SAVORY DILATION N/A 12/16/2020   Procedure: SAVORY DILATION;  Surgeon: Meryl Dare, MD;  Location: [facility] ENDOSCOPY;  Service: Endoscopy;  Laterality: N/A;   SPINAL CORD STIMULATOR INSERTION N/A 09/28/2019   Procedure: THORACIC SPINAL CORD STIMULATOR INSERTION;  Surgeon: Venita Lick, MD;  Location: [facility] OR;  Service: Orthopedics;  Laterality: N/A;  2.5 hrs   SPINAL  CORD STIMULATOR REMOVAL N/A 05/27/2021   Procedure: LUMBAR SPINAL CORD STIMULATOR REMOVAL;  Surgeon: Lucy Chris, MD;  Location: [facility] ORS;  Service: Neurosurgery;  Laterality: N/A;   TUBAL LIGATION     WISDOM TOOTH EXTRACTION     WOUND EXPLORATION N/A 03/04/2018   Procedure: EXPLORATION OF LUMBAR DECOMPRESSION WOUND;  Surgeon: Jene Every, MD;  Location: [facility] OR;  Service: Orthopedics;  Laterality: N/A;   Patient Active Problem List   Diagnosis Date Noted   Frequent falls 04/14/2023   Gastroesophageal reflux disease 04/14/2023   Right upper extremity numbness 09/29/2022   Stage 3a chronic kidney disease (HCC) 09/29/2022   Cervical spondylosis with radiculopathy 01/05/2022   S/P cervical spinal fusion 01/03/2022   Cervical disc disorder with radiculopathy of cervical region 11/27/2021   Uncontrolled pain 05/27/2021   Syncope 05/13/2021   Depression with anxiety 05/13/2021   Chronic diastolic CHF (congestive heart failure) (HCC) 05/13/2021   Chronic back pain 05/13/2021   Chest tightness 05/13/2021   Lower abdominal pain 05/13/2021   Syncope and collapse 01/01/2021   Abnormal echocardiogram 01/01/2021   BPPV (benign paroxysmal positional vertigo), left 12/26/2020   Odynophagia    Dysphagia 12/15/2020   Postural dizziness with presyncope 12/14/2020   Lumbar post-laminectomy syndrome    Chronic pain 09/28/2019   Lumbar spinal stenosis    Radicular pain    Gait disorder    Difficulty in urination 07/13/2018   Back pain 07/12/2018   Hyperlipidemia 04/16/2018   GAD (generalized anxiety disorder) 04/16/2018   AKI (acute kidney injury) (HCC)    Benign essential HTN    Major depression, recurrent (HCC)    Lumbar radiculopathy 03/09/2018   Paraparesis (HCC) 03/09/2018   Essential hypertension    Chronic nonintractable headache    Leukocytosis    Acute blood loss anemia    Postoperative pain    Generalized weakness    Spinal stenosis at L4-L5 level 03/04/2018   Spinal stenosis  of lumbar region 12/28/2015   Chest pain 04/16/2015    ONSET DATE: Referral date 04-01-23  REFERRING DIAG: M54.16 (ICD-10-CM) - Lumbar radiculopathy  THERAPY DIAG:  Other abnormalities of gait and mobility  Muscle weakness (generalized)  Rationale for Evaluation and Treatment: Rehabilitation  SUBJECTIVE:  SUBJECTIVE STATEMENT: Pt reports she is feeling stronger today - recovering from the illness she had last weekend Pt accompanied by: self  PERTINENT HISTORY: Frequent falls:  Spinal cord stimulator insertion on 09-28-19; stimulator removal on 05-27-21 with resultant LLE weakness;  decompression and laminectomy on 12/28/15 at L4-L5  with redo decompression surgery on May 9,2019, post-op paraparesis with surgery to remove small hematoma; 07/12/18 fall at work (involved rolling chair) with hospital admission, transfer to CIR with d/c home 08/03/18; HTN, Bil foot surgery; left ventricular hypertrophy, chronic pain syndrome, dyspnea, hypertrophic cardiomyopathy; cervical fusion March 2023;  had carpal tunnel surgery RUE in April 2024:  LVH  PAIN:  07-09-23 Are you having pain?  Pt reports her back "bothers her"- is constant in occurrence; rates pain 4-5/10 intensity; worse with bending over; sleeping on heating pad helps Intensity: 1/10  Location:  low back Description: soreness in low back Duration; varies depending on activity or movement being performed - worse with bending over or pulling leg up to chest Aggravating factors -worse with bending over or pulling leg up to chest Alleviating factor - heating pad helps some; pain medication  PRECAUTIONS: Fall  WEIGHT BEARING RESTRICTIONS: No  FALLS: Has patient fallen in last 6 months? Yes. Number of falls 3  LIVING ENVIRONMENT: Lives with: lives with their  spouse Lives in: House/apartment Stairs: Yes: External: 5 steps; on right going up Has following equipment at home: Single point cane, Wheelchair (manual), and [place]strand crutch  PLOF: Independent with basic ADLs, Independent with household mobility with device, Independent with community mobility with device, and Independent with transfers  PATIENT GOALS: improve strength Rt leg  OBJECTIVE:   DIAGNOSTIC FINDINGS: IMPRESSION: 1. Interval anterior discectomy and fusion from C4 through C6 with improved patency of the spinal canal and only mild residual foraminal narrowing. 2. Stable mild spinal stenosis and mild to moderate left foraminal narrowing at C3-4. 3. Stable mild spinal stenosis and mild foraminal narrowing bilaterally at C6-7. Mild foraminal narrowing bilaterally at C7-T1. 4. No cord deformity or abnormal cord signal.  No acute findings. No recent MRI of lumbar region  LOWER EXTREMITY MMT:    MMT Right Eval Left Eval  Hip flexion 3+   Hip extension    Hip abduction    Hip adduction    Hip internal rotation    Hip external rotation    Knee flexion 3-   Knee extension 3-   Ankle dorsiflexion 2-   Ankle plantarflexion    Ankle inversion    Ankle eversion    (Blank rows = not tested)   Today's Treatment: 07-09-23    GAIT: Gait pattern: decreased step length- Right, decreased stance time- Right, decreased hip/knee flexion- Right, and decreased ankle dorsiflexion- Right Distance walked: approx. 100' from lobby to mat table - SBA;  clinic distances during session Assistive device utilized: rollator  Level of assistance: SBA and CGA Comments: Pt's RLE fatigued after amb. Approx. 230' - pt decreased speed of ambulation but did not require seated rest period until 2 laps completed (230')     05-07-23: Gait velocity: with [place] Walk on AFO - 1"18 secs with rollator = .42 ft/sec   With Toe off AFO 31.9 secs with rollator = 1.03 ft/sec    THEREX:  RLE  strengthening exercises;  Step up exercise onto 6" step placed inside // bars; pt performed 10 reps (1st set) and 3 reps 2nd set with bil. UE support on // bars;  Rt foot remained on step  for entire exercise during each set  Tap ups onto 6" step with LLE 10 reps for RLE closed chain strengthening with bil. UE support  Step down 10 reps from 2" step inside // bars with bil. UE support:  2 reps from 4" step  Squats 8 reps with bil. UE support on // bars  Tap ups RLE onto 2" step with RUE support only (not bil. UE support) reps 2-7:  reps 8-10 with bil. UE support  Leg press 40# 10 reps bil. LE's ;  25# RLE only 10 reps:  30# RLE 5 reps: 25# 5 reps       HEP - Medbridge  Access Code: [email or web address] URL: [email or web address] Date: 05/06/2023 Prepared by: Maebelle Munroe  Exercises - Beginner Bridge  - 1 x daily - 7 x weekly - 1 sets - 10 reps - 3 hold - Supine Heel Slide  - 1 x daily - 7 x weekly - 1 sets - 10 reps - Bent Knee Fallouts  - 1 x daily - 7 x weekly - 1 sets - 10 reps - Sidelying Hip Abduction  - 1 x daily - 7 x weekly - 1 sets - 10 reps - Seated Knee Flexion Extension AROM   - 1 x daily - 7 x weekly - 1 sets - 10 reps   PATIENT EDUCATION: Education details:  educated pt in recommended AFO (blue rocker) but unable to trial this orthotic as clinic does not have a small Rt blue rocker AFO; trialed toe off to assess benefit of more supportive AFO compared to the one she is currently using - one with posterior strut only; pt also instructed in HEP - Medbridge [id number] Person educated: Patient Education method: Explanation, demonstration, handouts  Education comprehension: verbalized understanding, demonstrated understanding  HOME EXERCISE PROGRAM: To be issued  GOALS: Goals reviewed with patient? Yes  SHORT TERM GOALS: Target date: 07-24-23   1.   Amb. 230' with RW with SBA without requiring seated rest period. Baseline:  Goal status: Ongoing  2.  Perform sit  to stand from mat without UE support to demo improved LE strength. Baseline:    Goal status: NEW  3.  Modified independent step negotiation with use of bil. Hand rails using step by step sequence. Baseline: Goal status: Ongoing  4.   Improve TUG score from 22.25 secs to </= 19 secs with use of rollator.  Baseline:  22.25 secs with rollator & blue rocker AFO on 06-25-23 Goal status: NEW  5.   Stand for 1" without UE support to demo improved standing balance. Baseline:  Goal status: NEW    LONG TERM GOALS: Target date: 06-19-23  Amb. 21' with RW with SBA without requiring seated rest period. Baseline: 71' with RW Goal status: Partially met - 06-23-23  2.  Improve TUG score by at least 4 secs with use of RW for assistance with ambulation Baseline: TBA;  46 secs on 7-11;  35.97 secs on 06-16-23;  22.25 secs with rollator with blue rocker AFO Goal status: Goal met 06-25-23  3.  Perform 5x sit to stand transfers with LUE support only to demo improved strength in RLE.  Baseline:  Goal status: Goal met 05-29-23 - RUE used   4.  Modified independent step negotiation with use of bil. Hand rails using step by step sequence. Baseline:  Goal status: 06-23-23 -- SBA to supervision needed for safety  5.  Obtain new AFO for increased RLE stability to decrease fall risk with  gait. Baseline:  Goal status: In progress - delivery scheduled for 06-24-23; Goal met 06-25-23  6.  Independent in updated HEP for RLE strengthening. Baseline:  Goal status: Goal met 06-16-23   NEW LONG TERM GOALS:  Target date;  08-21-23    1.  Amb. 300' with RW with blue rocker AFO on RLE with SBA without requiring seated rest period.    Baseline;  180', 40' with rollator    Status:  NEW  2.  Improve TUG score from 22.25 secs to </= 15 secs with use of rollator.  Baseline:  22.25 secs with rollator & blue rocker AFO on 06-25-23  Status:  NEW  3.  Pt will report ability to negotiate steps in her garage at home  modified independently for safely entering & exiting home.  Baseline;  SBA to CGA  Status:  NEW  4.  Increase gait velocity to >/= 1.5 ft/sec with use of AFO & rollator for increased gait efficiency.  Baseline:  With Toe off AFO 31.9 secs with rollator = 1.03 ft/sec    Status:  NEW  5.  Independent in updated HEP for RLE strengthening.  Baseline:  Goal status: NEW  ASSESSMENT:  CLINICAL IMPRESSION: PT session focused on RLE closed chain strengthening with emphasis on Rt quad strengthening.  Pt performed 10 consecutive step ups onto 6" step for 1st time today and also increased strength with leg press exercise RLE only to 30# 5 reps for first time.  Pt continues to have decreased strength and decreased muscle endurance in RLE which limits ability to ambulate  prolonged distances.  Cont with POC.    OBJECTIVE IMPAIRMENTS: Abnormal gait, decreased balance, decreased coordination, decreased endurance, decreased strength, and impaired tone.   ACTIVITY LIMITATIONS: carrying, bending, squatting, stairs, transfers, and locomotion level  PARTICIPATION LIMITATIONS: cleaning, laundry, shopping, and community activity  PERSONAL FACTORS: Behavior pattern, Past/current experiences, and Time since onset of injury/illness/exacerbation are also affecting patient's functional outcome.   REHAB POTENTIAL: Good  CLINICAL DECISION MAKING: Evolving/moderate complexity  EVALUATION COMPLEXITY: Moderate  PLAN:  PT FREQUENCY: 2x/week  PT DURATION: 8 weeks  PLANNED INTERVENTIONS: Therapeutic exercises, Therapeutic activity, Neuromuscular re-education, Balance training, Gait training, Patient/Family education, Self Care, Stair training, Orthotic/Fit training, DME instructions, and Aquatic Therapy  PLAN FOR NEXT SESSION:  continue RLE strengthening   Kary Kos, PT 07/10/2023, 3:00 PM

## 2023-07-10 ENCOUNTER — Encounter: Payer: Self-pay | Admitting: Physical Therapy

## 2023-07-10 ENCOUNTER — Ambulatory Visit (INDEPENDENT_AMBULATORY_CARE_PROVIDER_SITE_OTHER): Payer: PPO

## 2023-07-10 DIAGNOSIS — Z23 Encounter for immunization: Secondary | ICD-10-CM | POA: Diagnosis not present

## 2023-07-14 ENCOUNTER — Ambulatory Visit: Payer: PPO | Admitting: Physical Therapy

## 2023-07-14 DIAGNOSIS — M6281 Muscle weakness (generalized): Secondary | ICD-10-CM

## 2023-07-14 DIAGNOSIS — R2689 Other abnormalities of gait and mobility: Secondary | ICD-10-CM | POA: Diagnosis not present

## 2023-07-14 NOTE — Therapy (Unsigned)
OUTPATIENT PHYSICAL THERAPY NEURO TREATMENT NOTE   Patient Name: [name] MRN: [id number] DOB:April 20, 1961, 62 y.o., female Today's Date: 07/15/2023   PCP: [place], [name]., MD REFERRING PROVIDER: Ranelle Oyster, MD  END OF SESSION:  PT End of Session - 07/15/23 1528     Visit Number 15    Number of Visits 27    Date for PT Re-Evaluation 08/27/23    Authorization Type [employer]    Authorization Time Period 04-21-23 - 06-27-23;  06-25-23 - 08-27-23    Progress Note Due on Visit 10    PT Start Time 0935    PT Stop Time 1017    PT Time Calculation (min) 42 min    Equipment Utilized During Treatment Gait belt    Activity Tolerance Patient tolerated treatment well   limited by RLE weakness   Behavior During Therapy [facility] for tasks assessed/performed                          Past Medical History:  Diagnosis Date   Anxiety    Aortic atherosclerosis (HCC) 04/16/2021   Atypical angina    Back pain    related to spinal stenosis and disc problem, radiates down left buttocks to leg., weakness occ.   Chest pain    a. 03/2015 Cath: nl cors; b. 03/2021 Cor CTA: Ca2+ = 0. Nl Cors.   Chronic pain syndrome    Dyspnea    GERD (gastroesophageal reflux disease)    Grade I diastolic dysfunction    Headache    Hyperlipidemia    Hypertension    Lumbar post-laminectomy syndrome    LVH (left ventricular hypertrophy) 12/15/2020   a. 11/2020 Echo: EF 65-70%, no rwma, sev asymm LVH with IVSd 1.9 cm. No LVOT obs @ rest. Gr1 DD. Triv MR.   PONV (postoperative nausea and vomiting)    Pulmonary nodules    a. 03/2021 CT Chest: 3mm pulm nodules in bilat lower lobes. F/u 1 yr.   Right foot drop    Syncope    a. 11/2020 Zio: No significant arrhythmias.   Vaginal foreign object    "Uses Femring"   Past Surgical History:  Procedure Laterality Date   ABDOMINAL HYSTERECTOMY     ANTERIOR CERVICAL DECOMP/DISCECTOMY FUSION N/A 01/03/2022   Procedure: Anterior Cervical  Decompression Fusion - Cervical four-Cervical five - Cervical five-Cervical six;  Surgeon: Tia Alert, MD;  Location: [facility] OR;  Service: Neurosurgery;  Laterality: N/A;   BIOPSY  12/16/2020   Procedure: BIOPSY;  Surgeon: Meryl Dare, MD;  Location: [facility] ENDOSCOPY;  Service: Endoscopy;;   CARDIAC CATHETERIZATION N/A 04/18/2015   Procedure: Left Heart Cath and Coronary Angiography;  Surgeon: Rinaldo Cloud, MD;  Location: [facility] INVASIVE CV LAB;  Service: Cardiovascular;  Laterality: N/A;   COLONOSCOPY     COLONOSCOPY W/ BIOPSIES AND POLYPECTOMY  2018   ESOPHAGOGASTRODUODENOSCOPY (EGD) WITH PROPOFOL N/A 12/16/2020   Procedure: ESOPHAGOGASTRODUODENOSCOPY (EGD) WITH PROPOFOL;  Surgeon: Meryl Dare, MD;  Location: [facility] ENDOSCOPY;  Service: Endoscopy;  Laterality: N/A;   FOOT SURGERY Bilateral    [facility] "bunion,bone spur, tendon" (1) -6'16, (1)-10'16   HEMATOMA EVACUATION N/A 01/05/2022   Procedure: Cervical Wound Exploration;  Surgeon: Coletta Memos, MD;  Location: [facility] OR;  Service: Neurosurgery;  Laterality: N/A;   IR EPIDUROGRAPHY  07/21/2018   LUMBAR LAMINECTOMY/DECOMPRESSION MICRODISCECTOMY Bilateral 12/28/2015   Procedure: MICRO LUMBAR DECOMPRESSION L4 - L5 BILATERALLY;  Surgeon: Jene Every, MD;  Location: [facility] ORS;  Service: Orthopedics;  Laterality: Bilateral;   LUMBAR LAMINECTOMY/DECOMPRESSION MICRODISCECTOMY Bilateral 03/04/2018   Procedure: Revision of Microlumbar Decompression Bilateral Lumbar Four-Five;  Surgeon: Jene Every, MD;  Location: [facility] OR;  Service: Orthopedics;  Laterality: Bilateral;  90 mins   SAVORY DILATION N/A 12/16/2020   Procedure: SAVORY DILATION;  Surgeon: Meryl Dare, MD;  Location: [facility] ENDOSCOPY;  Service: Endoscopy;  Laterality: N/A;   SPINAL CORD STIMULATOR INSERTION N/A 09/28/2019   Procedure: THORACIC SPINAL CORD STIMULATOR INSERTION;  Surgeon: Venita Lick, MD;  Location: [facility] OR;  Service: Orthopedics;  Laterality: N/A;  2.5 hrs   SPINAL  CORD STIMULATOR REMOVAL N/A 05/27/2021   Procedure: LUMBAR SPINAL CORD STIMULATOR REMOVAL;  Surgeon: Lucy Chris, MD;  Location: [facility] ORS;  Service: Neurosurgery;  Laterality: N/A;   TUBAL LIGATION     WISDOM TOOTH EXTRACTION     WOUND EXPLORATION N/A 03/04/2018   Procedure: EXPLORATION OF LUMBAR DECOMPRESSION WOUND;  Surgeon: Jene Every, MD;  Location: [facility] OR;  Service: Orthopedics;  Laterality: N/A;   Patient Active Problem List   Diagnosis Date Noted   Frequent falls 04/14/2023   Gastroesophageal reflux disease 04/14/2023   Right upper extremity numbness 09/29/2022   Stage 3a chronic kidney disease (HCC) 09/29/2022   Cervical spondylosis with radiculopathy 01/05/2022   S/P cervical spinal fusion 01/03/2022   Cervical disc disorder with radiculopathy of cervical region 11/27/2021   Uncontrolled pain 05/27/2021   Syncope 05/13/2021   Depression with anxiety 05/13/2021   Chronic diastolic CHF (congestive heart failure) (HCC) 05/13/2021   Chronic back pain 05/13/2021   Chest tightness 05/13/2021   Lower abdominal pain 05/13/2021   Syncope and collapse 01/01/2021   Abnormal echocardiogram 01/01/2021   BPPV (benign paroxysmal positional vertigo), left 12/26/2020   Odynophagia    Dysphagia 12/15/2020   Postural dizziness with presyncope 12/14/2020   Lumbar post-laminectomy syndrome    Chronic pain 09/28/2019   Lumbar spinal stenosis    Radicular pain    Gait disorder    Difficulty in urination 07/13/2018   Back pain 07/12/2018   Hyperlipidemia 04/16/2018   GAD (generalized anxiety disorder) 04/16/2018   AKI (acute kidney injury) (HCC)    Benign essential HTN    Major depression, recurrent (HCC)    Lumbar radiculopathy 03/09/2018   Paraparesis (HCC) 03/09/2018   Essential hypertension    Chronic nonintractable headache    Leukocytosis    Acute blood loss anemia    Postoperative pain    Generalized weakness    Spinal stenosis at L4-L5 level 03/04/2018   Spinal stenosis  of lumbar region 12/28/2015   Chest pain 04/16/2015    ONSET DATE: Referral date 04-01-23  REFERRING DIAG: M54.16 (ICD-10-CM) - Lumbar radiculopathy  THERAPY DIAG:  Other abnormalities of gait and mobility  Muscle weakness (generalized)  Rationale for Evaluation and Treatment: Rehabilitation  SUBJECTIVE:  SUBJECTIVE STATEMENT: Pt reports she is having more back pain today - reports she walked a long way in the [place] hospital on Sunday to see her brother, whose room was located at the end of a "long hallway"; pt reports she did not use her rollator - used 1 Loftstrand crutch only (with blue rocker AFO)   Pt accompanied by: self  PERTINENT HISTORY: Frequent falls:  Spinal cord stimulator insertion on 09-28-19; stimulator removal on 05-27-21 with resultant LLE weakness;  decompression and laminectomy on 12/28/15 at L4-L5  with redo decompression surgery on May 9,2019, post-op paraparesis with surgery to remove small hematoma; 07/12/18 fall at work (involved rolling chair) with hospital admission, transfer to CIR with d/c home 08/03/18; HTN, Bil foot surgery; left ventricular hypertrophy, chronic pain syndrome, dyspnea, hypertrophic cardiomyopathy; cervical fusion March 2023;  had carpal tunnel surgery RUE in April 2024:  LVH  PAIN:  07-14-23 Are you having pain?  Pt reports her back "bothers her"- is constant in occurrence; rates pain 4-5/10 intensity; worse with bending over; sleeping on heating pad helps Intensity: 4/10  Location:  low back Description: soreness in low back Duration; varies depending on activity or movement being performed - worse with bending over or pulling leg up to chest Aggravating factors -worse with bending over or pulling leg up to chest Alleviating factor - heating pad helps some; pain  medication  PRECAUTIONS: Fall  WEIGHT BEARING RESTRICTIONS: No  FALLS: Has patient fallen in last 6 months? Yes. Number of falls 3  LIVING ENVIRONMENT: Lives with: lives with their spouse Lives in: House/apartment Stairs: Yes: External: 5 steps; on right going up Has following equipment at home: Single point cane, Wheelchair (manual), and Lofstrand crutch  PLOF: Independent with basic ADLs, Independent with household mobility with device, Independent with community mobility with device, and Independent with transfers  PATIENT GOALS: improve strength Rt leg  OBJECTIVE:   DIAGNOSTIC FINDINGS: IMPRESSION: 1. Interval anterior discectomy and fusion from C4 through C6 with improved patency of the spinal canal and only mild residual foraminal narrowing. 2. Stable mild spinal stenosis and mild to moderate left foraminal narrowing at C3-4. 3. Stable mild spinal stenosis and mild foraminal narrowing bilaterally at C6-7. Mild foraminal narrowing bilaterally at C7-T1. 4. No cord deformity or abnormal cord signal.  No acute findings. No recent MRI of lumbar region  LOWER EXTREMITY MMT:    MMT Right Eval Left Eval  Hip flexion 3+   Hip extension    Hip abduction    Hip adduction    Hip internal rotation    Hip external rotation    Knee flexion 3-   Knee extension 3-   Ankle dorsiflexion 2-   Ankle plantarflexion    Ankle inversion    Ankle eversion    (Blank rows = not tested)   Today's Treatment: 07-14-23    GAIT: Gait pattern: decreased step length- Right, decreased stance time- Right, decreased hip/knee flexion- Right, and decreased ankle dorsiflexion- Right Distance walked: approx. 100' from lobby to mat table - SBA;  clinic distances during session Assistive device utilized: rollator  Level of assistance: SBA and CGA Comments: Pt's RLE fatigued after amb. Approx. 230' - pt decreased speed of ambulation but did not require seated rest period until 2 laps completed  (230')     05-07-23: Gait velocity: with [place] Walk on AFO - 1"18 secs with rollator = .42 ft/sec   With Toe off AFO 31.9 secs with rollator = 1.03 ft/sec  THEREX: RLE strengthening exercises:  Pt performed sit to stand exercise from mat table with Lt foot on 4" step for increased weight bearing and strengthening RLE - UE support used - 10 reps with rollator in front for assist with balance prn - CGA for safety;  1 rep only onto 6" - pt reported burning sensation in low back so this exercise was discontinued  Gait train 150' with rollator with SBA for RLE strengthening and endurance training  Step up exercise onto 4" step placed inside // bars; pt performed 10 reps 1 set only:   Rt foot remained on step for entire exercise during each set  Tap ups onto 4" step with LLE 10 reps for RLE closed chain strengthening with bil. UE support  Leg press 40# 10 reps bil. LE's:  25# 10 reps RLE only 2 sets 10 reps  Seated exercise performed after leg press at end of sesson - LAQ's x 10 reps; Lt hamstring stretching with leg on stool; added contract/relax for hip extension 10 reps 3-4 sec hold  Pt reported RLE felt better after performing AROM and contract/relax exercise   HEP - Medbridge  Access Code: [email or web address] URL: [email or web address] Date: 05/06/2023 Prepared by: Maebelle Munroe  Exercises - Beginner Bridge  - 1 x daily - 7 x weekly - 1 sets - 10 reps - 3 hold - Supine Heel Slide  - 1 x daily - 7 x weekly - 1 sets - 10 reps - Bent Knee Fallouts  - 1 x daily - 7 x weekly - 1 sets - 10 reps - Sidelying Hip Abduction  - 1 x daily - 7 x weekly - 1 sets - 10 reps - Seated Knee Flexion Extension AROM   - 1 x daily - 7 x weekly - 1 sets - 10 reps   PATIENT EDUCATION: Education details:  educated pt in recommended AFO (blue rocker) but unable to trial this orthotic as clinic does not have a small Rt blue rocker AFO; trialed toe off to assess benefit of more supportive AFO  compared to the one she is currently using - one with posterior strut only; pt also instructed in HEP - Medbridge [id number] Person educated: Patient Education method: Explanation, demonstration, handouts  Education comprehension: verbalized understanding, demonstrated understanding  HOME EXERCISE PROGRAM: To be issued  GOALS: Goals reviewed with patient? Yes  SHORT TERM GOALS: Target date: 07-24-23   1.   Amb. 230' with RW with SBA without requiring seated rest period. Baseline:  Goal status: Ongoing  2.  Perform sit to stand from mat without UE support to demo improved LE strength. Baseline:    Goal status: NEW  3.  Modified independent step negotiation with use of bil. Hand rails using step by step sequence. Baseline: Goal status: Ongoing  4.   Improve TUG score from 22.25 secs to </= 19 secs with use of rollator.  Baseline:  22.25 secs with rollator & blue rocker AFO on 06-25-23 Goal status: NEW  5.   Stand for 1" without UE support to demo improved standing balance. Baseline:  Goal status: NEW    LONG TERM GOALS: Target date: 07-20-23  Amb. 32' with RW with SBA without requiring seated rest period. Baseline: 43' with RW Goal status: Partially met - 06-23-23  2.  Improve TUG score by at least 4 secs with use of RW for assistance with ambulation Baseline: TBA;  46 secs on 7-11;  35.97 secs on 06-16-23;  22.25 secs with rollator  with blue rocker AFO Goal status: Goal met 06-25-23  3.  Perform 5x sit to stand transfers with LUE support only to demo improved strength in RLE.  Baseline:  Goal status: Goal met 05-29-23 - RUE used   4.  Modified independent step negotiation with use of bil. Hand rails using step by step sequence. Baseline:  Goal status: 06-23-23 -- SBA to supervision needed for safety  5.  Obtain new AFO for increased RLE stability to decrease fall risk with gait. Baseline:  Goal status: In progress - delivery scheduled for 06-24-23; Goal met 06-25-23  6.   Independent in updated HEP for RLE strengthening. Baseline:  Goal status: Goal met 06-16-23   NEW LONG TERM GOALS:  Target date;  08-21-23    1.  Amb. 300' with RW with blue rocker AFO on RLE with SBA without requiring seated rest period.    Baseline;  180', 40' with rollator    Status:  NEW  2.  Improve TUG score from 22.25 secs to </= 15 secs with use of rollator.  Baseline:  22.25 secs with rollator & blue rocker AFO on 06-25-23  Status:  NEW  3.  Pt will report ability to negotiate steps in her garage at home modified independently for safely entering & exiting home.  Baseline;  SBA to CGA  Status:  NEW  4.  Increase gait velocity to >/= 1.5 ft/sec with use of AFO & rollator for increased gait efficiency.  Baseline:  With Toe off AFO 31.9 secs with rollator = 1.03 ft/sec    Status:  NEW  5.  Independent in updated HEP for RLE strengthening.  Baseline:  Goal status: NEW  ASSESSMENT:  CLINICAL IMPRESSION: PT session focused on RLE strengthening in open and closed chain positions.  Pt reported increased back pain after amb. Long distance on Sunday with use of only 1 Lofstrand crutch and reported having a burning sensation in low back after performing step up exercise inside // bars onto 6" step (1 rep only performed).  Pt appears to have strained back muscles on Sunday due to asymmetry in UE support provided with use of 1 crutch.  Pt was informed that she needed to use rollator for increased UE support and stability and for increased symmetrical weight bearing.  Pt verbalized understanding and agreement.  Cont with POC.    OBJECTIVE IMPAIRMENTS: Abnormal gait, decreased balance, decreased coordination, decreased endurance, decreased strength, and impaired tone.   ACTIVITY LIMITATIONS: carrying, bending, squatting, stairs, transfers, and locomotion level  PARTICIPATION LIMITATIONS: cleaning, laundry, shopping, and community activity  PERSONAL FACTORS: Behavior pattern,  Past/current experiences, and Time since onset of injury/illness/exacerbation are also affecting patient's functional outcome.   REHAB POTENTIAL: Good  CLINICAL DECISION MAKING: Evolving/moderate complexity  EVALUATION COMPLEXITY: Moderate  PLAN:  PT FREQUENCY: 2x/week  PT DURATION: 8 weeks  PLANNED INTERVENTIONS: Therapeutic exercises, Therapeutic activity, Neuromuscular re-education, Balance training, Gait training, Patient/Family education, Self Care, Stair training, Orthotic/Fit training, DME instructions, and Aquatic Therapy  PLAN FOR NEXT SESSION:  continue RLE strengthening   Kary Kos, PT 07/15/2023, 3:30 PM

## 2023-07-15 ENCOUNTER — Encounter: Payer: Self-pay | Admitting: Physical Therapy

## 2023-07-16 ENCOUNTER — Encounter: Payer: Self-pay | Admitting: Physical Therapy

## 2023-07-16 ENCOUNTER — Ambulatory Visit: Payer: PPO | Admitting: Physical Therapy

## 2023-07-16 DIAGNOSIS — R2681 Unsteadiness on feet: Secondary | ICD-10-CM

## 2023-07-16 DIAGNOSIS — G5601 Carpal tunnel syndrome, right upper limb: Secondary | ICD-10-CM | POA: Diagnosis not present

## 2023-07-16 DIAGNOSIS — R2689 Other abnormalities of gait and mobility: Secondary | ICD-10-CM | POA: Diagnosis not present

## 2023-07-16 DIAGNOSIS — M6281 Muscle weakness (generalized): Secondary | ICD-10-CM

## 2023-07-16 DIAGNOSIS — M5416 Radiculopathy, lumbar region: Secondary | ICD-10-CM | POA: Diagnosis not present

## 2023-07-16 DIAGNOSIS — Z6828 Body mass index (BMI) 28.0-28.9, adult: Secondary | ICD-10-CM | POA: Diagnosis not present

## 2023-07-16 NOTE — Therapy (Signed)
OUTPATIENT PHYSICAL THERAPY NEURO TREATMENT NOTE   Patient Name: [name] MRN: [id number] DOB:[birth year], 62 y.o., female Today's Date: 07/16/2023   PCP: [place], [name]., MD REFERRING PROVIDER: Ranelle Oyster, MD  END OF SESSION:  PT End of Session - 07/16/23 1027     Visit Number 16    Number of Visits 27    Date for PT Re-Evaluation 08/27/23    Authorization Type [employer]    Authorization Time Period 04-21-23 - 06-27-23;  06-25-23 - 08-27-23    Progress Note Due on Visit 10    PT Start Time 0930    PT Stop Time 1016    PT Time Calculation (min) 46 min    Equipment Utilized During Treatment Gait belt;Other (comment)   Lofstrand crutches   Activity Tolerance Patient tolerated treatment well   limited by RLE weakness   Behavior During Therapy [facility] for tasks assessed/performed                           Past Medical History:  Diagnosis Date   Anxiety    Aortic atherosclerosis (HCC) 04/16/2021   Atypical angina    Back pain    related to spinal stenosis and disc problem, radiates down left buttocks to leg., weakness occ.   Chest pain    a. 03/2015 Cath: nl cors; b. 03/2021 Cor CTA: Ca2+ = 0. Nl Cors.   Chronic pain syndrome    Dyspnea    GERD (gastroesophageal reflux disease)    Grade I diastolic dysfunction    Headache    Hyperlipidemia    Hypertension    Lumbar post-laminectomy syndrome    LVH (left ventricular hypertrophy) 12/15/2020   a. 11/2020 Echo: EF 65-70%, no rwma, sev asymm LVH with IVSd 1.9 cm. No LVOT obs @ rest. Gr1 DD. Triv MR.   PONV (postoperative nausea and vomiting)    Pulmonary nodules    a. 03/2021 CT Chest: 3mm pulm nodules in bilat lower lobes. F/u 1 yr.   Right foot drop    Syncope    a. 11/2020 Zio: No significant arrhythmias.   Vaginal foreign object    "Uses Femring"   Past Surgical History:  Procedure Laterality Date   ABDOMINAL HYSTERECTOMY     ANTERIOR CERVICAL DECOMP/DISCECTOMY FUSION N/A  01/03/2022   Procedure: Anterior Cervical Decompression Fusion - Cervical four-Cervical five - Cervical five-Cervical six;  Surgeon: Tia Alert, MD;  Location: [facility] OR;  Service: Neurosurgery;  Laterality: N/A;   BIOPSY  12/16/2020   Procedure: BIOPSY;  Surgeon: Meryl Dare, MD;  Location: [facility] ENDOSCOPY;  Service: Endoscopy;;   CARDIAC CATHETERIZATION N/A 04/18/2015   Procedure: Left Heart Cath and Coronary Angiography;  Surgeon: Rinaldo Cloud, MD;  Location: [facility] INVASIVE CV LAB;  Service: Cardiovascular;  Laterality: N/A;   COLONOSCOPY     COLONOSCOPY W/ BIOPSIES AND POLYPECTOMY  2018   ESOPHAGOGASTRODUODENOSCOPY (EGD) WITH PROPOFOL N/A 12/16/2020   Procedure: ESOPHAGOGASTRODUODENOSCOPY (EGD) WITH PROPOFOL;  Surgeon: Meryl Dare, MD;  Location: [facility] ENDOSCOPY;  Service: Endoscopy;  Laterality: N/A;   FOOT SURGERY Bilateral    [facility] "bunion,bone spur, tendon" (1) -6'16, (1)-10'16   HEMATOMA EVACUATION N/A 01/05/2022   Procedure: Cervical Wound Exploration;  Surgeon: Coletta Memos, MD;  Location: [facility] OR;  Service: Neurosurgery;  Laterality: N/A;   IR EPIDUROGRAPHY  07/21/2018   LUMBAR LAMINECTOMY/DECOMPRESSION MICRODISCECTOMY Bilateral 12/28/2015   Procedure: MICRO LUMBAR DECOMPRESSION L4 - L5 BILATERALLY;  Surgeon: Jene Every, MD;  Location: [facility] ORS;  Service: Orthopedics;  Laterality: Bilateral;   LUMBAR LAMINECTOMY/DECOMPRESSION MICRODISCECTOMY Bilateral 03/04/2018   Procedure: Revision of Microlumbar Decompression Bilateral Lumbar Four-Five;  Surgeon: Jene Every, MD;  Location: [facility] OR;  Service: Orthopedics;  Laterality: Bilateral;  90 mins   SAVORY DILATION N/A 12/16/2020   Procedure: SAVORY DILATION;  Surgeon: Meryl Dare, MD;  Location: [facility] ENDOSCOPY;  Service: Endoscopy;  Laterality: N/A;   SPINAL CORD STIMULATOR INSERTION N/A 09/28/2019   Procedure: THORACIC SPINAL CORD STIMULATOR INSERTION;  Surgeon: Venita Lick, MD;  Location: [facility] OR;  Service:  Orthopedics;  Laterality: N/A;  2.5 hrs   SPINAL CORD STIMULATOR REMOVAL N/A 05/27/2021   Procedure: LUMBAR SPINAL CORD STIMULATOR REMOVAL;  Surgeon: Lucy Chris, MD;  Location: [facility] ORS;  Service: Neurosurgery;  Laterality: N/A;   TUBAL LIGATION     WISDOM TOOTH EXTRACTION     WOUND EXPLORATION N/A 03/04/2018   Procedure: EXPLORATION OF LUMBAR DECOMPRESSION WOUND;  Surgeon: Jene Every, MD;  Location: [facility] OR;  Service: Orthopedics;  Laterality: N/A;   Patient Active Problem List   Diagnosis Date Noted   Frequent falls 04/14/2023   Gastroesophageal reflux disease 04/14/2023   Right upper extremity numbness 09/29/2022   Stage 3a chronic kidney disease (HCC) 09/29/2022   Cervical spondylosis with radiculopathy 01/05/2022   S/P cervical spinal fusion 01/03/2022   Cervical disc disorder with radiculopathy of cervical region 11/27/2021   Uncontrolled pain 05/27/2021   Syncope 05/13/2021   Depression with anxiety 05/13/2021   Chronic diastolic CHF (congestive heart failure) (HCC) 05/13/2021   Chronic back pain 05/13/2021   Chest tightness 05/13/2021   Lower abdominal pain 05/13/2021   Syncope and collapse 01/01/2021   Abnormal echocardiogram 01/01/2021   BPPV (benign paroxysmal positional vertigo), left 12/26/2020   Odynophagia    Dysphagia 12/15/2020   Postural dizziness with presyncope 12/14/2020   Lumbar post-laminectomy syndrome    Chronic pain 09/28/2019   Lumbar spinal stenosis    Radicular pain    Gait disorder    Difficulty in urination 07/13/2018   Back pain 07/12/2018   Hyperlipidemia 04/16/2018   GAD (generalized anxiety disorder) 04/16/2018   AKI (acute kidney injury) (HCC)    Benign essential HTN    Major depression, recurrent (HCC)    Lumbar radiculopathy 03/09/2018   Paraparesis (HCC) 03/09/2018   Essential hypertension    Chronic nonintractable headache    Leukocytosis    Acute blood loss anemia    Postoperative pain    Generalized weakness    Spinal  stenosis at L4-L5 level 03/04/2018   Spinal stenosis of lumbar region 12/28/2015   Chest pain 04/16/2015    ONSET DATE: Referral date 04-01-23  REFERRING DIAG: M54.16 (ICD-10-CM) - Lumbar radiculopathy  THERAPY DIAG:  Other abnormalities of gait and mobility  Muscle weakness (generalized)  Unsteadiness on feet  Rationale for Evaluation and Treatment: Rehabilitation  SUBJECTIVE:  SUBJECTIVE STATEMENT: Pt reports she is feeling better today - went home on Tuesday and took Baclofen and went to sleep for a while.  Says back isn't hurting much today.  Pt asks if she would be able to use both Lofstrand crutches to ambulate into church for funeral on Saturday  Pt accompanied by: self  PERTINENT HISTORY: Frequent falls:  Spinal cord stimulator insertion on 09-28-19; stimulator removal on 05-27-21 with resultant LLE weakness;  decompression and laminectomy on 12/28/15 at L4-L5  with redo decompression surgery on May 9,2019, post-op paraparesis with surgery to remove small hematoma; 07/12/18 fall at work (involved rolling chair) with hospital admission, transfer to CIR with d/c home 08/03/18; HTN, Bil foot surgery; left ventricular hypertrophy, chronic pain syndrome, dyspnea, hypertrophic cardiomyopathy; cervical fusion March 2023;  had carpal tunnel surgery RUE in April 2024:  LVH  PAIN:  07-16-23 Are you having pain?  Pt reports her back "bothers her"- is constant in occurrence; rates pain 4-5/10 intensity; worse with bending over; sleeping on heating pad helps Intensity: 1/10  Location:  low back Description: soreness in low back Duration; varies depending on activity or movement being performed - worse with bending over or pulling leg up to chest Aggravating factors -worse with bending over or pulling leg up to  chest Alleviating factor - heating pad helps some; pain medication  PRECAUTIONS: Fall  WEIGHT BEARING RESTRICTIONS: No  FALLS: Has patient fallen in last 6 months? Yes. Number of falls 3  LIVING ENVIRONMENT: Lives with: lives with their spouse Lives in: House/apartment Stairs: Yes: External: 5 steps; on right going up Has following equipment at home: Single point cane, Wheelchair (manual), and Lofstrand crutch  PLOF: Independent with basic ADLs, Independent with household mobility with device, Independent with community mobility with device, and Independent with transfers  PATIENT GOALS: improve strength Rt leg  OBJECTIVE:   DIAGNOSTIC FINDINGS: IMPRESSION: 1. Interval anterior discectomy and fusion from C4 through C6 with improved patency of the spinal canal and only mild residual foraminal narrowing. 2. Stable mild spinal stenosis and mild to moderate left foraminal narrowing at C3-4. 3. Stable mild spinal stenosis and mild foraminal narrowing bilaterally at C6-7. Mild foraminal narrowing bilaterally at C7-T1. 4. No cord deformity or abnormal cord signal.  No acute findings. No recent MRI of lumbar region  LOWER EXTREMITY MMT:    MMT Right Eval Left Eval  Hip flexion 3+   Hip extension    Hip abduction    Hip adduction    Hip internal rotation    Hip external rotation    Knee flexion 3-   Knee extension 3-   Ankle dorsiflexion 2-   Ankle plantarflexion    Ankle inversion    Ankle eversion    (Blank rows = not tested)   Today's Treatment: 07-14-23    GAIT: Gait pattern: decreased step length- Right, decreased stance time- Right, decreased hip/knee flexion- Right, and decreased ankle dorsiflexion- Right Distance walked: approx. 75 from lobby to mat table - SBA;  clinic distances during session; 115' with rollator;  115' with Lofstrand crutches; approx. 44' from mat to // bars with crutches;  approx. 68' from // bars to leg press, approx. 30' from leg press to  mat table at end of session Assistive device utilized: rollator  Level of assistance: SBA and CGA Comments: Pt's RLE fatigued after amb. Approx. 230' - pt decreased speed of ambulation but did not require seated rest period until 2 laps completed (230')  05-07-23: Gait velocity: with [place] Walk on AFO - 1"18 secs with rollator = .42 ft/sec   With Toe off AFO 31.9 secs with rollator = 1.03 ft/sec    THEREX: RLE strengthening exercises:  Gait train 42' with rollator and 115' with  Lofstrand crutches - see above -  with CGA to SBA   Step up exercise onto 4" step placed inside // bars; pt performed 10 reps x 2 sets with seated rest break between sets;   Rt foot remained on step for entire exercise during each set  Tap ups with RLE onto 4" step for incr. Hip and knee flexor strengthening 10 reps x 1 set   Tap ups onto 4" step with LLE 10 reps for RLE closed chain strengthening with bil. UE support - 2 sets; gradual reduction in bil. UE support on // bars as able  Squats 10 reps with bil. UE support on // bars   Standing static inside // bars without UE support with SBA - 10.5 secs 1st trial and 17.69 secs 2nd trial without UE support   Leg press 40# 10 reps bil. LE's:  25# 10 reps RLE only 2 sets 10 reps  Seated exercise performed after leg press at end of sesson - LAQ's x 10 reps   Simulated sitting down on church pew with management of Lofstrand crutches - instructed pt to transfer Rt crutch to Lt hand and then hold onto pew for stability; for standing - advised pt to pull up using pew in front of her for increased safety; otherwise, push up from pew with both hands      Standing 10.5 and 17.69 secs without UE support     HEP - Medbridge  Access Code: [email or web address] URL: [email or web address] Date: 05/06/2023 Prepared by: Maebelle Munroe  Exercises - Beginner Bridge  - 1 x daily - 7 x weekly - 1 sets - 10 reps - 3 hold - Supine Heel Slide  - 1 x daily - 7 x  weekly - 1 sets - 10 reps - Bent Knee Fallouts  - 1 x daily - 7 x weekly - 1 sets - 10 reps - Sidelying Hip Abduction  - 1 x daily - 7 x weekly - 1 sets - 10 reps - Seated Knee Flexion Extension AROM   - 1 x daily - 7 x weekly - 1 sets - 10 reps   PATIENT EDUCATION: Education details:  educated pt in recommended AFO (blue rocker) but unable to trial this orthotic as clinic does not have a small Rt blue rocker AFO; trialed toe off to assess benefit of more supportive AFO compared to the one she is currently using - one with posterior strut only; pt also instructed in HEP - Medbridge [id number] Person educated: Patient Education method: Explanation, demonstration, handouts  Education comprehension: verbalized understanding, demonstrated understanding  HOME EXERCISE PROGRAM: To be issued  GOALS: Goals reviewed with patient? Yes  SHORT TERM GOALS: Target date: 07-24-23   1.   Amb. 230' with RW with SBA without requiring seated rest period. Baseline:  Goal status: Ongoing  2.  Perform sit to stand from mat without UE support to demo improved LE strength. Baseline:    Goal status: NEW  3.  Modified independent step negotiation with use of bil. Hand rails using step by step sequence. Baseline: Goal status: Ongoing  4.   Improve TUG score from 22.25 secs to </= 19 secs with use of rollator.  Baseline:  22.25 secs with  rollator & blue rocker AFO on 06-25-23 Goal status: NEW  5.   Stand for 1" without UE support to demo improved standing balance. Baseline:  Goal status: NEW    LONG TERM GOALS: Target date: 07-20-23  Amb. 73' with RW with SBA without requiring seated rest period. Baseline: 61' with RW Goal status: Partially met - 06-23-23  2.  Improve TUG score by at least 4 secs with use of RW for assistance with ambulation Baseline: TBA;  46 secs on 7-11;  35.97 secs on 06-16-23;  22.25 secs with rollator with blue rocker AFO Goal status: Goal met 06-25-23  3.  Perform 5x sit to  stand transfers with LUE support only to demo improved strength in RLE.  Baseline:  Goal status: Goal met 05-29-23 - RUE used   4.  Modified independent step negotiation with use of bil. Hand rails using step by step sequence. Baseline:  Goal status: 06-23-23 -- SBA to supervision needed for safety  5.  Obtain new AFO for increased RLE stability to decrease fall risk with gait. Baseline:  Goal status: In progress - delivery scheduled for 06-24-23; Goal met 06-25-23  6.  Independent in updated HEP for RLE strengthening. Baseline:  Goal status: Goal met 06-16-23   NEW LONG TERM GOALS:  Target date;  08-21-23    1.  Amb. 300' with RW with blue rocker AFO on RLE with SBA without requiring seated rest period.    Baseline;  180', 40' with rollator    Status:  NEW  2.  Improve TUG score from 22.25 secs to </= 15 secs with use of rollator.  Baseline:  22.25 secs with rollator & blue rocker AFO on 06-25-23  Status:  NEW  3.  Pt will report ability to negotiate steps in her garage at home modified independently for safely entering & exiting home.  Baseline;  SBA to CGA  Status:  NEW  4.  Increase gait velocity to >/= 1.5 ft/sec with use of AFO & rollator for increased gait efficiency.  Baseline:  With Toe off AFO 31.9 secs with rollator = 1.03 ft/sec    Status:  NEW  5.  Independent in updated HEP for RLE strengthening.  Baseline:  Goal status: NEW  ASSESSMENT:  CLINICAL IMPRESSION: PT session focused on RLE strengthening and gait training with use of Lofstrand crutches as pt requests to use these for assistance with ambulation into church for a funeral she is attending on Saturday.  Pt did well with use of crutches - amb. With both crutches forward and not with reciprocal gait pattern.  Step height for Rt step up exercise remained 4" in today's session per pt's request to avoid risk of straining low back musc. Due to need for ability to ambulate with crutches for attending funeral in 2  days.  Pt stood unsupported 10.5 secs 1st trial and 17 secs on 2nd trial without UE support on // bars.  Cont with POC.    OBJECTIVE IMPAIRMENTS: Abnormal gait, decreased balance, decreased coordination, decreased endurance, decreased strength, and impaired tone.   ACTIVITY LIMITATIONS: carrying, bending, squatting, stairs, transfers, and locomotion level  PARTICIPATION LIMITATIONS: cleaning, laundry, shopping, and community activity  PERSONAL FACTORS: Behavior pattern, Past/current experiences, and Time since onset of injury/illness/exacerbation are also affecting patient's functional outcome.   REHAB POTENTIAL: Good  CLINICAL DECISION MAKING: Evolving/moderate complexity  EVALUATION COMPLEXITY: Moderate  PLAN:  PT FREQUENCY: 2x/week  PT DURATION: 8 weeks  PLANNED INTERVENTIONS: Therapeutic exercises, Therapeutic activity,  Neuromuscular re-education, Balance training, Gait training, Patient/Family education, Self Care, Stair training, Orthotic/Fit training, DME instructions, and Aquatic Therapy  PLAN FOR NEXT SESSION:  how did amb. Go with crutches? continue RLE strengthening   Sriram Febles, Donavan Burnet, PT 07/16/2023, 10:31 AM

## 2023-07-21 ENCOUNTER — Ambulatory Visit: Payer: PPO | Admitting: Physical Therapy

## 2023-07-21 ENCOUNTER — Other Ambulatory Visit: Payer: Self-pay

## 2023-07-21 ENCOUNTER — Other Ambulatory Visit (HOSPITAL_COMMUNITY): Payer: Self-pay

## 2023-07-21 DIAGNOSIS — R2689 Other abnormalities of gait and mobility: Secondary | ICD-10-CM | POA: Diagnosis not present

## 2023-07-21 DIAGNOSIS — M6281 Muscle weakness (generalized): Secondary | ICD-10-CM

## 2023-07-21 MED ORDER — DIAZEPAM 5 MG PO TABS
5.0000 mg | ORAL_TABLET | ORAL | 0 refills | Status: DC
  Filled 2023-07-21: qty 1, 1d supply, fill #0

## 2023-07-21 NOTE — Therapy (Unsigned)
OUTPATIENT PHYSICAL THERAPY NEURO TREATMENT NOTE   Patient Name: [name] MRN: [id number] DOB:[birth year], 62 y.o., female Today's Date: 07/22/2023   PCP: [place], [name]., MD REFERRING PROVIDER: Ranelle Oyster, MD  END OF SESSION:  PT End of Session - 07/22/23 0943     Visit Number 17    Number of Visits 27    Date for PT Re-Evaluation 08/27/23    Authorization Type [employer]    Authorization Time Period 04-21-23 - 06-27-23;  06-25-23 - 08-27-23    Progress Note Due on Visit 10    PT Start Time 0935    PT Stop Time 1022    PT Time Calculation (min) 47 min    Equipment Utilized During Treatment Gait belt    Activity Tolerance Patient tolerated treatment well   limited by RLE weakness   Behavior During Therapy [facility] for tasks assessed/performed                            Past Medical History:  Diagnosis Date   Anxiety    Aortic atherosclerosis (HCC) 04/16/2021   Atypical angina    Back pain    related to spinal stenosis and disc problem, radiates down left buttocks to leg., weakness occ.   Chest pain    a. 03/2015 Cath: nl cors; b. 03/2021 Cor CTA: Ca2+ = 0. Nl Cors.   Chronic pain syndrome    Dyspnea    GERD (gastroesophageal reflux disease)    Grade I diastolic dysfunction    Headache    Hyperlipidemia    Hypertension    Lumbar post-laminectomy syndrome    LVH (left ventricular hypertrophy) 12/15/2020   a. 11/2020 Echo: EF 65-70%, no rwma, sev asymm LVH with IVSd 1.9 cm. No LVOT obs @ rest. Gr1 DD. Triv MR.   PONV (postoperative nausea and vomiting)    Pulmonary nodules    a. 03/2021 CT Chest: 3mm pulm nodules in bilat lower lobes. F/u 1 yr.   Right foot drop    Syncope    a. 11/2020 Zio: No significant arrhythmias.   Vaginal foreign object    "Uses Femring"   Past Surgical History:  Procedure Laterality Date   ABDOMINAL HYSTERECTOMY     ANTERIOR CERVICAL DECOMP/DISCECTOMY FUSION N/A 01/03/2022   Procedure: Anterior Cervical  Decompression Fusion - Cervical four-Cervical five - Cervical five-Cervical six;  Surgeon: Tia Alert, MD;  Location: [facility] OR;  Service: Neurosurgery;  Laterality: N/A;   BIOPSY  12/16/2020   Procedure: BIOPSY;  Surgeon: Meryl Dare, MD;  Location: [facility] ENDOSCOPY;  Service: Endoscopy;;   CARDIAC CATHETERIZATION N/A 04/18/2015   Procedure: Left Heart Cath and Coronary Angiography;  Surgeon: Rinaldo Cloud, MD;  Location: [facility] INVASIVE CV LAB;  Service: Cardiovascular;  Laterality: N/A;   COLONOSCOPY     COLONOSCOPY W/ BIOPSIES AND POLYPECTOMY  2018   ESOPHAGOGASTRODUODENOSCOPY (EGD) WITH PROPOFOL N/A 12/16/2020   Procedure: ESOPHAGOGASTRODUODENOSCOPY (EGD) WITH PROPOFOL;  Surgeon: Meryl Dare, MD;  Location: [facility] ENDOSCOPY;  Service: Endoscopy;  Laterality: N/A;   FOOT SURGERY Bilateral    [facility] "bunion,bone spur, tendon" (1) -6'16, (1)-10'16   HEMATOMA EVACUATION N/A 01/05/2022   Procedure: Cervical Wound Exploration;  Surgeon: Coletta Memos, MD;  Location: [facility] OR;  Service: Neurosurgery;  Laterality: N/A;   IR EPIDUROGRAPHY  07/21/2018   LUMBAR LAMINECTOMY/DECOMPRESSION MICRODISCECTOMY Bilateral 12/28/2015   Procedure: MICRO LUMBAR DECOMPRESSION L4 - L5 BILATERALLY;  Surgeon: Tinnie Gens  Shelle Iron, MD;  Location: [facility] ORS;  Service: Orthopedics;  Laterality: Bilateral;   LUMBAR LAMINECTOMY/DECOMPRESSION MICRODISCECTOMY Bilateral 03/04/2018   Procedure: Revision of Microlumbar Decompression Bilateral Lumbar Four-Five;  Surgeon: Jene Every, MD;  Location: [facility] OR;  Service: Orthopedics;  Laterality: Bilateral;  90 mins   SAVORY DILATION N/A 12/16/2020   Procedure: SAVORY DILATION;  Surgeon: Meryl Dare, MD;  Location: [facility] ENDOSCOPY;  Service: Endoscopy;  Laterality: N/A;   SPINAL CORD STIMULATOR INSERTION N/A 09/28/2019   Procedure: THORACIC SPINAL CORD STIMULATOR INSERTION;  Surgeon: Venita Lick, MD;  Location: [facility] OR;  Service: Orthopedics;  Laterality: N/A;  2.5 hrs   SPINAL  CORD STIMULATOR REMOVAL N/A 05/27/2021   Procedure: LUMBAR SPINAL CORD STIMULATOR REMOVAL;  Surgeon: Lucy Chris, MD;  Location: [facility] ORS;  Service: Neurosurgery;  Laterality: N/A;   TUBAL LIGATION     WISDOM TOOTH EXTRACTION     WOUND EXPLORATION N/A 03/04/2018   Procedure: EXPLORATION OF LUMBAR DECOMPRESSION WOUND;  Surgeon: Jene Every, MD;  Location: [facility] OR;  Service: Orthopedics;  Laterality: N/A;   Patient Active Problem List   Diagnosis Date Noted   Frequent falls 04/14/2023   Gastroesophageal reflux disease 04/14/2023   Right upper extremity numbness 09/29/2022   Stage 3a chronic kidney disease (HCC) 09/29/2022   Cervical spondylosis with radiculopathy 01/05/2022   S/P cervical spinal fusion 01/03/2022   Cervical disc disorder with radiculopathy of cervical region 11/27/2021   Uncontrolled pain 05/27/2021   Syncope 05/13/2021   Depression with anxiety 05/13/2021   Chronic diastolic CHF (congestive heart failure) (HCC) 05/13/2021   Chronic back pain 05/13/2021   Chest tightness 05/13/2021   Lower abdominal pain 05/13/2021   Syncope and collapse 01/01/2021   Abnormal echocardiogram 01/01/2021   BPPV (benign paroxysmal positional vertigo), left 12/26/2020   Odynophagia    Dysphagia 12/15/2020   Postural dizziness with presyncope 12/14/2020   Lumbar post-laminectomy syndrome    Chronic pain 09/28/2019   Lumbar spinal stenosis    Radicular pain    Gait disorder    Difficulty in urination 07/13/2018   Back pain 07/12/2018   Hyperlipidemia 04/16/2018   GAD (generalized anxiety disorder) 04/16/2018   AKI (acute kidney injury) (HCC)    Benign essential HTN    Major depression, recurrent (HCC)    Lumbar radiculopathy 03/09/2018   Paraparesis (HCC) 03/09/2018   Essential hypertension    Chronic nonintractable headache    Leukocytosis    Acute blood loss anemia    Postoperative pain    Generalized weakness    Spinal stenosis at L4-L5 level 03/04/2018   Spinal stenosis  of lumbar region 12/28/2015   Chest pain 04/16/2015    ONSET DATE: Referral date 04-01-23  REFERRING DIAG: M54.16 (ICD-10-CM) - Lumbar radiculopathy  THERAPY DIAG:  Other abnormalities of gait and mobility  Muscle weakness (generalized)  Rationale for Evaluation and Treatment: Rehabilitation  SUBJECTIVE:  SUBJECTIVE STATEMENT: Pt reports she used L[employer]strand crutches to walk into a funeral last Saturday- did ok but was extra cautious.  Pt reports increased discomfort in Rt thenar eminence today; has MRI on low back scheduled for next Thursday, Oct. 2; has MRI of Rt wrist scheduled in December but is on waiting list if cancellation occurs before then  Pt accompanied by: self  PERTINENT HISTORY: Frequent falls:  Spinal cord stimulator insertion on 09-28-19; stimulator removal on 05-27-21 with resultant LLE weakness;  decompression and laminectomy on 12/28/15 at L4-L5  with redo decompression surgery on May 9,2019, post-op paraparesis with surgery to remove small hematoma; 07/12/18 fall at work (involved rolling chair) with hospital admission, transfer to CIR with d/c home 08/03/18; HTN, Bil foot surgery; left ventricular hypertrophy, chronic pain syndrome, dyspnea, hypertrophic cardiomyopathy; cervical fusion March 2023;  had carpal tunnel surgery RUE in April 2024:  LVH  PAIN:  07-21-23 Are you having pain?  Pt reports her back "bothers her"- is constant in occurrence; rates pain 4-5/10 intensity; worse with bending over; sleeping on heating pad helps Intensity: 1/10 Location:  low back Description: soreness in low back Duration; varies depending on activity or movement being performed - worse with bending over or pulling leg up to chest Aggravating factors -worse with bending over or pulling leg up to  chest Alleviating factor - heating pad helps some; pain medication  PRECAUTIONS: Fall  WEIGHT BEARING RESTRICTIONS: No  FALLS: Has patient fallen in last 6 months? Yes. Number of falls 3  LIVING ENVIRONMENT: Lives with: lives with their spouse Lives in: House/apartment Stairs: Yes: External: 5 steps; on right going up Has following equipment at home: Single point cane, Wheelchair (manual), and Lofstrand crutch  PLOF: Independent with basic ADLs, Independent with household mobility with device, Independent with community mobility with device, and Independent with transfers  PATIENT GOALS: improve strength Rt leg  OBJECTIVE:   DIAGNOSTIC FINDINGS: IMPRESSION: 1. Interval anterior discectomy and fusion from C4 through C6 with improved patency of the spinal canal and only mild residual foraminal narrowing. 2. Stable mild spinal stenosis and mild to moderate left foraminal narrowing at C3-4. 3. Stable mild spinal stenosis and mild foraminal narrowing bilaterally at C6-7. Mild foraminal narrowing bilaterally at C7-T1. 4. No cord deformity or abnormal cord signal.  No acute findings. No recent MRI of lumbar region  LOWER EXTREMITY MMT:    MMT Right Eval Left Eval  Hip flexion 3+   Hip extension    Hip abduction    Hip adduction    Hip internal rotation    Hip external rotation    Knee flexion 3-   Knee extension 3-   Ankle dorsiflexion 2-   Ankle plantarflexion    Ankle inversion    Ankle eversion    (Blank rows = not tested)   Today's Treatment: 07-21-23    GAIT: Gait pattern: decreased step length- Right, decreased stance time- Right, decreased hip/knee flexion- Right, and decreased ankle dorsiflexion- Right Distance walked: 230' (2 laps) with rollator (for STG assessment); clinic distances during session between exercise locations Assistive device utilized: rollator  Level of assistance: SBA and CGA Comments: blue rocker AFO donned on RLE     05-07-23: Gait  velocity: with [place] Walk on AFO - 1"18 secs with rollator = .42 ft/sec   With Toe off AFO 31.9 secs with rollator = 1.03 ft/sec  Gait train 230' with rollator with SBA (2 laps) - for STG assessment  TUG score:  1st  trial - 32.78 secs with rollator: 2nd trial 22.62 secs with rollator  THEREX: RLE strengthening exercises:  Step up exercise onto 4" step placed inside // bars; pt performed 10 reps;   Rt foot remained on step for entire exercise during each set  Step down exercise RLE = 4" step initially used - pt performed 2 reps; changed to 2" step due to difficulty with 4" step - 10 reps with bil. UE support  Tap ups with RLE onto 4" step for incr. Hip and knee flexor strengthening 10 reps x 1 set   Tap ups onto 4" step with LLE 10 reps for RLE closed chain strengthening with bil. UE support on // bars - 1 set  Standing static by side of mat 1" without UE support on rollator but with close SBA for safety (for STG assessment)  Leg press 30# 10 reps x 2 sets with bil. LE's  Seated exercise performed after step down exercise - RLE LAQ's x 10 reps     HEP - Med[employer]  Access Code: [email or web address] URL: [email or web address] Date: 05/06/2023 Prepared by: Maebelle Munroe  Exercises - Beginner Bridge  - 1 x daily - 7 x weekly - 1 sets - 10 reps - 3 hold - Supine Heel Slide  - 1 x daily - 7 x weekly - 1 sets - 10 reps - Bent Knee Fallouts  - 1 x daily - 7 x weekly - 1 sets - 10 reps - Sidelying Hip Abduction  - 1 x daily - 7 x weekly - 1 sets - 10 reps - Seated Knee Flexion Extension AROM   - 1 x daily - 7 x weekly - 1 sets - 10 reps   PATIENT EDUCATION: Education details:  discussed STG's and progress achieved Person educated: Patient Education method: Explanation, demonstration, handouts  Education comprehension: verbalized understanding, demonstrated understanding  HOME EXERCISE PROGRAM: To be issued  GOALS: Goals reviewed with patient? Yes  SHORT TERM GOALS:  Target date: 07-24-23   1.   Amb. 230' with RW with SBA without requiring seated rest period. Baseline:  Goal status: Goal met 07-21-23  2.  Perform sit to stand from mat without UE support to demo improved LE strength. Baseline:    Goal status:  Not met - ongoing - 07-21-23  3.  Modified independent step negotiation with use of bil. Hand rails using step by step sequence. Baseline: Goal status: Goal met 07-21-23  4.   Improve TUG score from 22.25 secs to </= 19 secs with use of rollator.  Baseline:  22.25 secs with rollator & blue rocker AFO on 06-25-23;    07-21-23=  32.78, 22.62 secs with rollator Goal status: Not met - ongoing - 07-21-23  5.   Stand for 1" without UE support to demo improved standing balance. Baseline:  Goal status: Goal met 07-21-23    LONG TERM GOALS: Target date: 07-20-23  Amb. 230' with RW with SBA without requiring seated rest period. Baseline: 29' with RW Goal status: Partially met - 06-23-23  2.  Improve TUG score by at least 4 secs with use of RW for assistance with ambulation Baseline: TBA;  46 secs on 7-11;  35.97 secs on 06-16-23;  22.25 secs with rollator with blue rocker AFO Goal status: Goal met 06-25-23  3.  Perform 5x sit to stand transfers with LUE support only to demo improved strength in RLE.  Baseline:  Goal status: Goal met 05-29-23 - RUE used   4.  Modified  independent step negotiation with use of bil. Hand rails using step by step sequence. Baseline:  Goal status: 06-23-23 -- SBA to supervision needed for safety  5.  Obtain new AFO for increased RLE stability to decrease fall risk with gait. Baseline:  Goal status: In progress - delivery scheduled for 06-24-23; Goal met 06-25-23  6.  Independent in updated HEP for RLE strengthening. Baseline:  Goal status: Goal met 06-16-23   NEW LONG TERM GOALS:  Target date;  08-21-23    1.  Amb. 300' with RW with blue rocker AFO on RLE with SBA without requiring seated rest period.    Baseline;   180', 40' with rollator    Status:  NEW  2.  Improve TUG score from 22.25 secs to </= 15 secs with use of rollator.  Baseline:  22.25 secs with rollator & blue rocker AFO on 06-25-23  Status:  NEW  3.  Pt will report ability to negotiate steps in her garage at home modified independently for safely entering & exiting home.  Baseline;  SBA to CGA  Status:  NEW  4.  Increase gait velocity to >/= 1.5 ft/sec with use of AFO & rollator for increased gait efficiency.  Baseline:  With Toe off AFO 31.9 secs with rollator = 1.03 ft/sec    Status:  NEW  5.  Independent in updated HEP for RLE strengthening.  Baseline:  Goal status: NEW  ASSESSMENT:  CLINICAL IMPRESSION: Pt has met STG's # 1, 3 & 5:  STG #2 not met as pt requires at least 1 UE support for sit to stand transfer due to weakness in RLE.  STG #4 not met as TUG scores were 32.78 secs on 1st trial and 22.62 secs on 2nd trial.  Goal set for </= 19 secs.  Pt reporting increased discomfort in Rt hand, specifically in Rt thenar eminence region, possibly due to increased pressure in RUE with use of Lofstrand crutches when she attended a funeral on Saturday, Sept. 21st.  Pt reports she has MRI's of low back and Rt hand scheduled.  Cont with POC.    OBJECTIVE IMPAIRMENTS: Abnormal gait, decreased balance, decreased coordination, decreased endurance, decreased strength, and impaired tone.   ACTIVITY LIMITATIONS: carrying, bending, squatting, stairs, transfers, and locomotion level  PARTICIPATION LIMITATIONS: cleaning, laundry, shopping, and community activity  PERSONAL FACTORS: Behavior pattern, Past/current experiences, and Time since onset of injury/illness/exacerbation are also affecting patient's functional outcome.   REHAB POTENTIAL: Good  CLINICAL DECISION MAKING: Evolving/moderate complexity  EVALUATION COMPLEXITY: Moderate  PLAN:  PT FREQUENCY: 2x/week  PT DURATION: 8 weeks  PLANNED INTERVENTIONS: Therapeutic exercises,  Therapeutic activity, Neuromuscular re-education, Balance training, Gait training, Patient/Family education, Self Care, Stair training, Orthotic/Fit training, DME instructions, and Aquatic Therapy  PLAN FOR NEXT SESSION:  continue RLE strengthening   Kary Kos, PT 07/22/2023, 9:45 AM

## 2023-07-22 ENCOUNTER — Encounter: Payer: Self-pay | Admitting: Physical Therapy

## 2023-07-28 ENCOUNTER — Ambulatory Visit: Payer: PPO | Attending: Physical Medicine & Rehabilitation | Admitting: Physical Therapy

## 2023-07-28 ENCOUNTER — Encounter: Payer: Self-pay | Admitting: Physical Therapy

## 2023-07-28 ENCOUNTER — Other Ambulatory Visit (HOSPITAL_BASED_OUTPATIENT_CLINIC_OR_DEPARTMENT_OTHER): Payer: Self-pay | Admitting: Family

## 2023-07-28 ENCOUNTER — Other Ambulatory Visit (HOSPITAL_COMMUNITY): Payer: Self-pay

## 2023-07-28 DIAGNOSIS — R2689 Other abnormalities of gait and mobility: Secondary | ICD-10-CM | POA: Diagnosis not present

## 2023-07-28 DIAGNOSIS — M6281 Muscle weakness (generalized): Secondary | ICD-10-CM | POA: Insufficient documentation

## 2023-07-28 DIAGNOSIS — R2681 Unsteadiness on feet: Secondary | ICD-10-CM | POA: Insufficient documentation

## 2023-07-28 DIAGNOSIS — I10 Essential (primary) hypertension: Secondary | ICD-10-CM

## 2023-07-28 NOTE — Therapy (Signed)
OUTPATIENT PHYSICAL THERAPY NEURO TREATMENT NOTE   Patient Name: [name] MRN: [id number] DOB:[birth year], 62 y.o., female Today's Date: 07/28/2023   PCP: [place], [name]., MD REFERRING PROVIDER: Ranelle Oyster, MD  END OF SESSION:  PT End of Session - 07/28/23 2007     Visit Number 18    Number of Visits 27    Date for PT Re-Evaluation 08/27/23    Authorization Type [employer]    Authorization Time Period 04-21-23 - 06-27-23;  06-25-23 - 08-27-23    Progress Note Due on Visit 10    PT Start Time 0932    PT Stop Time 1020    PT Time Calculation (min) 48 min    Equipment Utilized During Treatment Gait belt    Activity Tolerance Patient tolerated treatment well   limited by RLE weakness   Behavior During Therapy [facility] for tasks assessed/performed                             Past Medical History:  Diagnosis Date   Anxiety    Aortic atherosclerosis (HCC) 04/16/2021   Atypical angina (HCC)    Back pain    related to spinal stenosis and disc problem, radiates down left buttocks to leg., weakness occ.   Chest pain    a. 03/2015 Cath: nl cors; b. 03/2021 Cor CTA: Ca2+ = 0. Nl Cors.   Chronic pain syndrome    Dyspnea    GERD (gastroesophageal reflux disease)    Grade I diastolic dysfunction    Headache    Hyperlipidemia    Hypertension    Lumbar post-laminectomy syndrome    LVH (left ventricular hypertrophy) 12/15/2020   a. 11/2020 Echo: EF 65-70%, no rwma, sev asymm LVH with IVSd 1.9 cm. No LVOT obs @ rest. Gr1 DD. Triv MR.   PONV (postoperative nausea and vomiting)    Pulmonary nodules    a. 03/2021 CT Chest: 3mm pulm nodules in bilat lower lobes. F/u 1 yr.   Right foot drop    Syncope    a. 11/2020 Zio: No significant arrhythmias.   Vaginal foreign object    "Uses Femring"   Past Surgical History:  Procedure Laterality Date   ABDOMINAL HYSTERECTOMY     ANTERIOR CERVICAL DECOMP/DISCECTOMY FUSION N/A 01/03/2022   Procedure: Anterior  Cervical Decompression Fusion - Cervical four-Cervical five - Cervical five-Cervical six;  Surgeon: Tia Alert, MD;  Location[facility] OR;  Service: Neurosurgery;  Laterality: N/A;   BIOPSY  12/16/2020   Procedure: BIOPSY;  Surgeon: Meryl Dare, MD;  Location: [facility] ENDOSCOPY;  Service: Endoscopy;;   CARDIAC CATHETERIZATION N/A 04/18/2015   Procedure: Left Heart Cath and Coronary Angiography;  Surgeon: Rinaldo Cloud, MD;  Location: [facility] INVASIVE CV LAB;  Service: Cardiovascular;  Laterality: N/A;   COLONOSCOPY     COLONOSCOPY W/ BIOPSIES AND POLYPECTOMY  2018   ESOPHAGOGASTRODUODENOSCOPY (EGD) WITH PROPOFOL N/A 12/16/2020   Procedure: ESOPHAGOGASTRODUODENOSCOPY (EGD) WITH PROPOFOL;  Surgeon: Meryl Dare, MD;  Location: [facility] ENDOSCOPY;  Service: Endoscopy;  Laterality: N/A;   FOOT SURGERY Bilateral    [facility] "bunion,bone spur, tendon" (1) -6'16, (1)-10'16   HEMATOMA EVACUATION N/A 01/05/2022   Procedure: Cervical Wound Exploration;  Surgeon: Coletta Memos, MD;  Location: [facility] OR;  Service: Neurosurgery;  Laterality: N/A;   IR EPIDUROGRAPHY  07/21/2018   LUMBAR LAMINECTOMY/DECOMPRESSION MICRODISCECTOMY Bilateral 12/28/2015   Procedure: MICRO LUMBAR DECOMPRESSION L4 - L5 BILATERALLY;  Surgeon: Jene Every, MD;  Location: [facility] ORS;  Service: Orthopedics;  Laterality: Bilateral;   LUMBAR LAMINECTOMY/DECOMPRESSION MICRODISCECTOMY Bilateral 03/04/2018   Procedure: Revision of Microlumbar Decompression Bilateral Lumbar Four-Five;  Surgeon: Jene Every, MD;  Location: [facility] OR;  Service: Orthopedics;  Laterality: Bilateral;  90 mins   SAVORY DILATION N/A 12/16/2020   Procedure: SAVORY DILATION;  Surgeon: Meryl Dare, MD;  Location: [facility] ENDOSCOPY;  Service: Endoscopy;  Laterality: N/A;   SPINAL CORD STIMULATOR INSERTION N/A 09/28/2019   Procedure: THORACIC SPINAL CORD STIMULATOR INSERTION;  Surgeon: Venita Lick, MD;  Location: [facility] OR;  Service: Orthopedics;  Laterality: N/A;  2.5 hrs    SPINAL CORD STIMULATOR REMOVAL N/A 05/27/2021   Procedure: LUMBAR SPINAL CORD STIMULATOR REMOVAL;  Surgeon: Lucy Chris, MD;  Location: [facility] ORS;  Service: Neurosurgery;  Laterality: N/A;   TUBAL LIGATION     WISDOM TOOTH EXTRACTION     WOUND EXPLORATION N/A 03/04/2018   Procedure: EXPLORATION OF LUMBAR DECOMPRESSION WOUND;  Surgeon: Jene Every, MD;  Location: [facility] OR;  Service: Orthopedics;  Laterality: N/A;   Patient Active Problem List   Diagnosis Date Noted   Frequent falls 04/14/2023   Gastroesophageal reflux disease 04/14/2023   Right upper extremity numbness 09/29/2022   Stage 3a chronic kidney disease (HCC) 09/29/2022   Cervical spondylosis with radiculopathy 01/05/2022   S/P cervical spinal fusion 01/03/2022   Cervical disc disorder with radiculopathy of cervical region 11/27/2021   Uncontrolled pain 05/27/2021   Syncope 05/13/2021   Depression with anxiety 05/13/2021   Chronic diastolic CHF (congestive heart failure) (HCC) 05/13/2021   Chronic back pain 05/13/2021   Chest tightness 05/13/2021   Lower abdominal pain 05/13/2021   Syncope and collapse 01/01/2021   Abnormal echocardiogram 01/01/2021   BPPV (benign paroxysmal positional vertigo), left 12/26/2020   Odynophagia    Dysphagia 12/15/2020   Postural dizziness with presyncope 12/14/2020   Lumbar post-laminectomy syndrome    Chronic pain 09/28/2019   Lumbar spinal stenosis    Radicular pain    Gait disorder    Difficulty in urination 07/13/2018   Back pain 07/12/2018   Hyperlipidemia 04/16/2018   GAD (generalized anxiety disorder) 04/16/2018   AKI (acute kidney injury) (HCC)    Benign essential HTN    Major depression, recurrent (HCC)    Lumbar radiculopathy 03/09/2018   Paraparesis (HCC) 03/09/2018   Essential hypertension    Chronic nonintractable headache    Leukocytosis    Acute blood loss anemia    Postoperative pain    Generalized weakness    Spinal stenosis at L4-L5 level 03/04/2018   Spinal  stenosis of lumbar region 12/28/2015   Chest pain 04/16/2015    ONSET DATE: Referral date 04-01-23  REFERRING DIAG: M54.16 (ICD-10-CM) - Lumbar radiculopathy  THERAPY DIAG:  Other abnormalities of gait and mobility  Muscle weakness (generalized)  Rationale for Evaluation and Treatment: Rehabilitation  SUBJECTIVE:  SUBJECTIVE STATEMENT: Pt reports her Rt hand is hurting more - feels it is getting worse; has MRI on back scheduled on Thursday this week - is going to ask about getting MRI on hand scheduled sooner than her Dec. Appt - pt states she doesn't know if she can wait until then. Is impacting her ability to weight bear through RUE and hold onto walker for long amb. distances  Pt accompanied by: self  PERTINENT HISTORY: Frequent falls:  Spinal cord stimulator insertion on 09-28-19; stimulator removal on 05-27-21 with resultant LLE weakness;  decompression and laminectomy on 12/28/15 at L4-L5  with redo decompression surgery on May 9,2019, post-op paraparesis with surgery to remove small hematoma; 07/12/18 fall at work (involved rolling chair) with hospital admission, transfer to CIR with d/c home 08/03/18; HTN, Bil foot surgery; left ventricular hypertrophy, chronic pain syndrome, dyspnea, hypertrophic cardiomyopathy; cervical fusion March 2023;  had carpal tunnel surgery RUE in April 2024:  LVH  PAIN:  07-28-23 Are you having pain?  Pt reports her back "bothers her"- is constant in occurrence; rates pain 4-5/10 intensity; worse with bending over; sleeping on heating pad helps Intensity: 1-2/10;  8-9/10 pain reported in Rt thenar eminence area of Rt hand Location:  low back Description: soreness in low back Duration; varies depending on activity or movement being performed - worse with bending over or pulling  leg up to chest Aggravating factors -worse with bending over or pulling leg up to chest Alleviating factor - heating pad helps some; pain medication  PRECAUTIONS: Fall  WEIGHT BEARING RESTRICTIONS: No  FALLS: Has patient fallen in last 6 months? Yes. Number of falls 3  LIVING ENVIRONMENT: Lives with: lives with their spouse Lives in: House/apartment Stairs: Yes: External: 5 steps; on right going up Has following equipment at home: Single point cane, Wheelchair (manual), and [place] crutch  PLOF: Independent with basic ADLs, Independent with household mobility with device, Independent with community mobility with device, and Independent with transfers  PATIENT GOALS: improve strength Rt leg  OBJECTIVE:   DIAGNOSTIC FINDINGS: IMPRESSION: 1. Interval anterior discectomy and fusion from C4 through C6 with improved patency of the spinal canal and only mild residual foraminal narrowing. 2. Stable mild spinal stenosis and mild to moderate left foraminal narrowing at C3-4. 3. Stable mild spinal stenosis and mild foraminal narrowing bilaterally at C6-7. Mild foraminal narrowing bilaterally at C7-T1. 4. No cord deformity or abnormal cord signal.  No acute findings. No recent MRI of lumbar region  LOWER EXTREMITY MMT:    MMT Right Eval Left Eval  Hip flexion 3+   Hip extension    Hip abduction    Hip adduction    Hip internal rotation    Hip external rotation    Knee flexion 3-   Knee extension 3-   Ankle dorsiflexion 2-   Ankle plantarflexion    Ankle inversion    Ankle eversion    (Blank rows = not tested)   Today's Treatment: 07-28-23    GAIT: Gait pattern: decreased step length- Right, decreased stance time- Right, decreased hip/knee flexion- Right, and decreased ankle dorsiflexion- Right Distance walked: 230' (2 laps) with rollator; clinic distances during session between exercise locations Assistive device utilized: rollator  Level of assistance: SBA and  CGA Comments: blue rocker AFO donned on RLE     05-07-23: Gait velocity: with [place] Walk on AFO - 1"18 secs with rollator = .42 ft/sec   With Toe off AFO 31.9 secs with rollator = 1.03 ft/sec  THEREX: RLE strengthening exercises:  Attempted to perform step up exercise from mat table with Lt foot on 4" step with RUE support but pt unable to do due to difficulty pushing up to stand with RUE due to pain  Step up exercise onto 4" step placed inside // bars; pt performed 8 reps;  then needed seated rest period;  7 reps performed in 2nd set; Rt foot remained on step for entire exercise during each set  Step down exercise RLE =  2" step used - 10 reps with bil. UE support   Tap ups onto 4" step with LLE 10 reps for RLE closed chain strengthening with bil. UE support on // bars; progressed to 6" step - tap ups with Lt foot onto step 10 reps for RLE closed chain strengthening - min tactile cue to keep Rt knee slightly flexed to prevent hyperextension  Leg press 30# 10 reps x 2 sets with bil. LE's: 25# RLE only 2 sets 10   RLE LAQ's x 10 reps after leg press exercise     HEP - Med[employer]  Access Code: [email or web address] URL: [email or web address] Date: 05/06/2023 Prepared by: Maebelle Munroe  Exercises - Beginner Bridge  - 1 x daily - 7 x weekly - 1 sets - 10 reps - 3 hold - Supine Heel Slide  - 1 x daily - 7 x weekly - 1 sets - 10 reps - Bent Knee Fallouts  - 1 x daily - 7 x weekly - 1 sets - 10 reps - Sidelying Hip Abduction  - 1 x daily - 7 x weekly - 1 sets - 10 reps - Seated Knee Flexion Extension AROM   - 1 x daily - 7 x weekly - 1 sets - 10 reps   PATIENT EDUCATION: Education details:  discussed STG's and progress achieved Person educated: Patient Education method: Explanation, demonstration, handouts  Education comprehension: verbalized understanding, demonstrated understanding  HOME EXERCISE PROGRAM: To be issued  GOALS: Goals reviewed with patient?  Yes  SHORT TERM GOALS: Target date: 07-24-23   1.   Amb. 230' with RW with SBA without requiring seated rest period. Baseline:  Goal status: Goal met 07-21-23  2.  Perform sit to stand from mat without UE support to demo improved LE strength. Baseline:    Goal status:  Not met - ongoing - 07-21-23  3.  Modified independent step negotiation with use of bil. Hand rails using step by step sequence. Baseline: Goal status: Goal met 07-21-23  4.   Improve TUG score from 22.25 secs to </= 19 secs with use of rollator.  Baseline:  22.25 secs with rollator & blue rocker AFO on 06-25-23;    07-21-23=  32.78, 22.62 secs with rollator Goal status: Not met - ongoing - 07-21-23  5.   Stand for 1" without UE support to demo improved standing balance. Baseline:  Goal status: Goal met 07-21-23    LONG TERM GOALS: Target date: 07-20-23  Amb. 230' with RW with SBA without requiring seated rest period. Baseline: 68' with RW Goal status: Partially met - 06-23-23  2.  Improve TUG score by at least 4 secs with use of RW for assistance with ambulation Baseline: TBA;  46 secs on 7-11;  35.97 secs on 06-16-23;  22.25 secs with rollator with blue rocker AFO Goal status: Goal met 06-25-23  3.  Perform 5x sit to stand transfers with LUE support only to demo improved strength in RLE.  Baseline:  Goal status: Goal met  05-29-23 - RUE used   4.  Modified independent step negotiation with use of bil. Hand rails using step by step sequence. Baseline:  Goal status: 06-23-23 -- SBA to supervision needed for safety  5.  Obtain new AFO for increased RLE stability to decrease fall risk with gait. Baseline:  Goal status: In progress - delivery scheduled for 06-24-23; Goal met 06-25-23  6.  Independent in updated HEP for RLE strengthening. Baseline:  Goal status: Goal met 06-16-23   NEW LONG TERM GOALS:  Target date;  08-21-23    1.  Amb. 300' with RW with blue rocker AFO on RLE with SBA without requiring seated rest  period.    Baseline;  180', 40' with rollator    Status:  NEW  2.  Improve TUG score from 22.25 secs to </= 15 secs with use of rollator.  Baseline:  22.25 secs with rollator & blue rocker AFO on 06-25-23  Status:  NEW  3.  Pt will report ability to negotiate steps in her garage at home modified independently for safely entering & exiting home.  Baseline;  SBA to CGA  Status:  NEW  4.  Increase gait velocity to >/= 1.5 ft/sec with use of AFO & rollator for increased gait efficiency.  Baseline:  With Toe off AFO 31.9 secs with rollator = 1.03 ft/sec    Status:  NEW  5.  Independent in updated HEP for RLE strengthening.  Baseline:  Goal status: NEW  ASSESSMENT:  CLINICAL IMPRESSION: PT session focused on RLE strengthening exercises in standing/closed chain position.  Pt reporting increased pain in Rt hand/thenar eminence which is now impacting pt's ability to weight bear through her RUE and subsequently resulting in more weight placed on RLE which is resulting in increased weakness and instability.  Pt needed seated rest period after performing 8 reps during 1st set of step up exercise onto 4" step and 7 reps in 2nd set - pt was unable to perform 10 reps consecutively in 1 set in today's session.  Cont with POC.    OBJECTIVE IMPAIRMENTS: Abnormal gait, decreased balance, decreased coordination, decreased endurance, decreased strength, and impaired tone.   ACTIVITY LIMITATIONS: carrying, bending, squatting, stairs, transfers, and locomotion level  PARTICIPATION LIMITATIONS: cleaning, laundry, shopping, and community activity  PERSONAL FACTORS: Behavior pattern, Past/current experiences, and Time since onset of injury/illness/exacerbation are also affecting patient's functional outcome.   REHAB POTENTIAL: Good  CLINICAL DECISION MAKING: Evolving/moderate complexity  EVALUATION COMPLEXITY: Moderate  PLAN:  PT FREQUENCY: 2x/week  PT DURATION: 8 weeks  PLANNED INTERVENTIONS:  Therapeutic exercises, Therapeutic activity, Neuromuscular re-education, Balance training, Gait training, Patient/Family education, Self Care, Stair training, Orthotic/Fit training, DME instructions, and Aquatic Therapy  PLAN FOR NEXT SESSION:  continue RLE strengthening   Kary Kos, PT 07/28/2023, 8:10 PM

## 2023-07-29 ENCOUNTER — Other Ambulatory Visit (HOSPITAL_COMMUNITY): Payer: Self-pay

## 2023-07-29 ENCOUNTER — Encounter: Payer: PPO | Attending: Physical Medicine & Rehabilitation | Admitting: Physical Medicine & Rehabilitation

## 2023-07-29 ENCOUNTER — Encounter: Payer: Self-pay | Admitting: Physical Medicine & Rehabilitation

## 2023-07-29 VITALS — BP 136/88 | HR 101 | Ht 65.0 in

## 2023-07-29 DIAGNOSIS — M5416 Radiculopathy, lumbar region: Secondary | ICD-10-CM | POA: Diagnosis not present

## 2023-07-29 DIAGNOSIS — M1811 Unilateral primary osteoarthritis of first carpometacarpal joint, right hand: Secondary | ICD-10-CM | POA: Diagnosis not present

## 2023-07-29 DIAGNOSIS — M21371 Foot drop, right foot: Secondary | ICD-10-CM | POA: Diagnosis not present

## 2023-07-29 DIAGNOSIS — M961 Postlaminectomy syndrome, not elsewhere classified: Secondary | ICD-10-CM | POA: Diagnosis not present

## 2023-07-29 DIAGNOSIS — M549 Dorsalgia, unspecified: Secondary | ICD-10-CM

## 2023-07-29 DIAGNOSIS — G8929 Other chronic pain: Secondary | ICD-10-CM | POA: Diagnosis not present

## 2023-07-29 MED ORDER — HYDRALAZINE HCL 50 MG PO TABS
ORAL_TABLET | ORAL | 2 refills | Status: DC
  Filled 2023-07-29: qty 90, 45d supply, fill #0
  Filled 2023-09-05: qty 90, 45d supply, fill #1

## 2023-07-29 NOTE — Patient Instructions (Signed)
ALWAYS FEEL FREE TO CALL OUR OFFICE WITH ANY PROBLEMS OR QUESTIONS [phone])  **PLEASE NOTE** ALL MEDICATION REFILL REQUESTS (INCLUDING CONTROLLED SUBSTANCES) NEED TO BE MADE AT LEAST 7 DAYS PRIOR TO REFILL BEING DUE. ANY REFILL REQUESTS INSIDE THAT TIME FRAME MAY RESULT IN DELAYS IN RECEIVING YOUR PRESCRIPTION.    VOLTAREN GEL (DICLOFENAC IS GENERIC) TO YOUR RIGHT THUMB AND WRIST 3-4 X DAILY  ICE AS NEEDED  WEAR A WRIST SPLINT WHILE YOUR'E ACTIVE DURING THE DAY  WE CAN CONSIDER INJECTING IF IT DOESN'T IMPROVE

## 2023-07-29 NOTE — Progress Notes (Signed)
Subjective:    Patient ID: [name], female    DOB: 1961-09-19, 62 y.o.   MRN: [id number]  HPI  [name] is here in follow-up of her chronic lower extremity weakness and gait disorder.  Last communication I had with her over the summer was in regards to her therapy and lower extremity weakness.  I ordered her a blue rocker AFO for support of her right lower extremity. The brace is working well for her. She feels that it's a little heavy. Her right knee still feels a little weak but it is improving and she has had better balance with gait.[name] Her back is still tender especially after a recent fall. NS ordered a new MRI.   She is also had continued pain in her right wrist along the thumb side.  She had carpal tunnel surgery earlier this year which went well.  However she still having the same pain.  We reviewed her x-rays from February which demonstrated mild to moderate osteoarthritis in her hand along the right first carpometacarpal joint.  Otherwise she is doing fairly well.  She did lose a godfather who was like a second father to her and that has been emotionally taxing for her.   Pain Inventory Average Pain 3 Pain Right Now 3 My pain is burning, tingling, and aching  LOCATION OF PAIN  Hand, Back  BOWEL Number of stools per week: 2   BLADDER Normal   Mobility use a cane use a walker do you drive?  yes Do you have any goals in this area?  yes  Function disabled: date disabled .  Neuro/Psych weakness tingling trouble walking spasms dizziness anxiety  Prior Studies Any changes since last visit?  no  Physicians involved in your care Any changes since last visit?  no   Family History  Problem Relation Age of Onset   Heart attack Mother    Lung cancer Father    Cancer Father    Pancreatic cancer Sister    Breast cancer Sister 53   Multiple myeloma Sister    Breast cancer Sister        diagnosed in her 20's   Heart attack Sister     Throat cancer Brother    Lung cancer Brother    Stomach cancer Cousin    Colon cancer Neg Hx    Colon polyps Neg Hx    Esophageal cancer Neg Hx    Rectal cancer Neg Hx    Social History   Socioeconomic History   Marital status: Married    Spouse name: Soil scientist   Number of children: 1   Years of education: Not on file   Highest education level: Not on file  Occupational History   Not on file  Tobacco Use   Smoking status: Never   Smokeless tobacco: Never  Vaping Use   Vaping status: Never Used  Substance and Sexual Activity   Alcohol use: Never   Drug use: Never   Sexual activity: Yes    Partners: Male    Birth control/protection: Post-menopausal, Surgical  Other Topics Concern   Not on file  Social History Narrative   Not on file   Social Determinants of Health   Financial Resource Strain: Low Risk  (09/29/2022)   Overall Financial Resource Strain (CARDIA)    Difficulty of Paying Living Expenses: Not hard at all  Food Insecurity: No Food Insecurity (09/29/2022)   Hunger Vital Sign    Worried About Running Out of  Food in the Last Year: Never true    Ran Out of Food in the Last Year: Never true  Transportation Needs: No Transportation Needs (09/29/2022)   PRAPARE - Administrator, Civil Service (Medical): No    Lack of Transportation (Non-Medical): No  Physical Activity: Inactive (09/29/2022)   Exercise Vital Sign    Days of Exercise per Week: 0 days    Minutes of Exercise per Session: 0 min  Stress: No Stress Concern Present (09/29/2022)   [employer] of Occupational Health - Occupational Stress Questionnaire    Feeling of Stress : Not at all  Social Connections: Socially Integrated (09/29/2022)   Social Connection and Isolation Panel [NHANES]    Frequency of Communication with Friends and Family: More than three times a week    Frequency of Social Gatherings with Friends and Family: More than three times a week    Attends Religious Services:  More than 4 times per year    Active Member of [employer] or Organizations: Yes    Attends Engineer, structural: More than 4 times per year    Marital Status: Married   Past Surgical History:  Procedure Laterality Date   ABDOMINAL HYSTERECTOMY     ANTERIOR CERVICAL DECOMP/DISCECTOMY FUSION N/A 01/03/2022   Procedure: Anterior Cervical Decompression Fusion - Cervical four-Cervical five - Cervical five-Cervical six;  Surgeon: Tia Alert, MD;  Location: [facility] OR;  Service: Neurosurgery;  Laterality: N/A;   BIOPSY  12/16/2020   Procedure: BIOPSY;  Surgeon: Meryl Dare, MD;  Location: [facility] ENDOSCOPY;  Service: Endoscopy;;   CARDIAC CATHETERIZATION N/A 04/18/2015   Procedure: Left Heart Cath and Coronary Angiography;  Surgeon: Rinaldo Cloud, MD;  Location: [facility] INVASIVE CV LAB;  Service: Cardiovascular;  Laterality: N/A;   COLONOSCOPY     COLONOSCOPY W/ BIOPSIES AND POLYPECTOMY  2018   ESOPHAGOGASTRODUODENOSCOPY (EGD) WITH PROPOFOL N/A 12/16/2020   Procedure: ESOPHAGOGASTRODUODENOSCOPY (EGD) WITH PROPOFOL;  Surgeon: Meryl Dare, MD;  Location: [facility] ENDOSCOPY;  Service: Endoscopy;  Laterality: N/A;   FOOT SURGERY Bilateral    [facility] "bunion,bone spur, tendon" (1) -6'16, (1)-10'16   HEMATOMA EVACUATION N/A 01/05/2022   Procedure: Cervical Wound Exploration;  Surgeon: Coletta Memos, MD;  Location: [facility] OR;  Service: Neurosurgery;  Laterality: N/A;   IR EPIDUROGRAPHY  07/21/2018   LUMBAR LAMINECTOMY/DECOMPRESSION MICRODISCECTOMY Bilateral 12/28/2015   Procedure: MICRO LUMBAR DECOMPRESSION L4 - L5 BILATERALLY;  Surgeon: Jene Every, MD;  Location: [facility] ORS;  Service: Orthopedics;  Laterality: Bilateral;   LUMBAR LAMINECTOMY/DECOMPRESSION MICRODISCECTOMY Bilateral 03/04/2018   Procedure: Revision of Microlumbar Decompression Bilateral Lumbar Four-Five;  Surgeon: Jene Every, MD;  Location: [facility] OR;  Service: Orthopedics;  Laterality: Bilateral;  90 mins   SAVORY DILATION N/A  12/16/2020   Procedure: SAVORY DILATION;  Surgeon: Meryl Dare, MD;  Location: [facility] ENDOSCOPY;  Service: Endoscopy;  Laterality: N/A;   SPINAL CORD STIMULATOR INSERTION N/A 09/28/2019   Procedure: THORACIC SPINAL CORD STIMULATOR INSERTION;  Surgeon: Venita Lick, MD;  Location: [facility] OR;  Service: Orthopedics;  Laterality: N/A;  2.5 hrs   SPINAL CORD STIMULATOR REMOVAL N/A 05/27/2021   Procedure: LUMBAR SPINAL CORD STIMULATOR REMOVAL;  Surgeon: Lucy Chris, MD;  Location: [facility] ORS;  Service: Neurosurgery;  Laterality: N/A;   TUBAL LIGATION     WISDOM TOOTH EXTRACTION     WOUND EXPLORATION N/A 03/04/2018   Procedure: EXPLORATION OF LUMBAR DECOMPRESSION WOUND;  Surgeon: Jene Every, MD;  Location: [facility] OR;  Service: Orthopedics;  Laterality:  N/A;   Past Medical History:  Diagnosis Date   Anxiety    Aortic atherosclerosis (HCC) 04/16/2021   Atypical angina (HCC)    Back pain    related to spinal stenosis and disc problem, radiates down left buttocks to leg., weakness occ.   Chest pain    a. 03/2015 Cath: nl cors; b. 03/2021 Cor CTA: Ca2+ = 0. Nl Cors.   Chronic pain syndrome    Dyspnea    GERD (gastroesophageal reflux disease)    Grade I diastolic dysfunction    Headache    Hyperlipidemia    Hypertension    Lumbar post-laminectomy syndrome    LVH (left ventricular hypertrophy) 12/15/2020   a. 11/2020 Echo: EF 65-70%, no rwma, sev asymm LVH with IVSd 1.9 cm. No LVOT obs @ rest. Gr1 DD. Triv MR.   PONV (postoperative nausea and vomiting)    Pulmonary nodules    a. 03/2021 CT Chest: 3mm pulm nodules in bilat lower lobes. F/u 1 yr.   Right foot drop    Syncope    a. 11/2020 Zio: No significant arrhythmias.   Vaginal foreign object    "Uses Femring"   Ht 5\' 5"  (1.651 m)   BMI 28.12 kg/m   Opioid Risk Score:   Fall Risk Score:  `1  Depression screen [facility] 2/9     07/29/2023    9:50 AM 04/14/2023   10:05 AM 04/01/2023    2:25 PM 12/10/2022    1:40 PM 09/29/2022   11:37 AM 09/29/2022     9:01 AM 04/07/2022    8:38 AM  Depression screen PHQ 2/9  Decreased Interest 0 0 0 0 0 2 0  Down, Depressed, Hopeless 0 0 0 0 0 0 1  PHQ - 2 Score 0 0 0 0 0 2 1  Altered sleeping  1   0 1 1  Tired, decreased energy  1   0 0 1  Change in appetite  0   0 0 1  Feeling bad or failure about yourself   0   0 0 0  Trouble concentrating  0   0 0 0  Moving slowly or fidgety/restless  0   0 0 0  Suicidal thoughts  0   0 0 0  PHQ-9 Score  2   0 3 4  Difficult doing work/chores  Somewhat difficult   Not difficult at all Not difficult at all Somewhat difficult      Review of Systems  All other systems reviewed and are negative.     Objective:   Physical Exam  General: No acute distress HEENT: NCAT, EOMI, oral membranes moist Cards: reg rate  Chest: normal effort Abdomen: Soft, NT, ND Skin: dry, intact Extremities: no edema Psych: pleasant and appropriate  Skin: intact.   Neuro: RLE 3/5 to 3+ to 4-/5-prox with knee extension -->remains trace distally.--has AFO in place.[name] LLE 4/5 grossly. Sensory loss distally bilaterally.  Upper extremities are 4+ to 5 out of 5 on the right and  4 out of 5 on the left with some pain inhibition.  Improved left arm movement.  RIght hand with improved sensation.  Improved clearance of the right leg during swing phase of gait.  There is still some mild knee instability noted during stance. Musculoskeletal: Low back generally tender to palpation.  Right hand is tender at the thenar eminence particularly at the right carpometacarpal joint.  Finkelstein's test is equivocal to positive.     Assessment & Plan:  1. Post Lumbar Laminectomy / Decompression: Lumbar Radiculopathy:             -gabapentin continue at 300mg  tid             -tramadol  for breakthrough severe pain.   This was not refilled today -blue rocker AFO helping. Right knee looks stronger.      -Follow-up MRI per neurosurgery 2. left BPPV-improved             -discussed being aware of safety,  intake, etc             -acclimation 3. Anxiety. PCP Following and prescribing Alprazolam.               -buspar, xanax prn per primary 4. Spasms: may continue robaxin 5. Orthotics:             -continue AFO as above 6.  Cervical spondylosis with radiculopath at C6.                       -improved after C4-5, C5-6 ACDF 7. Right hand pain: + CTS,  -good results with release.  Not sure she needs to follow-up nerve conduction test of the hand however.   -moderate 1st carpo metarcarpal OA is likely source of her ongoing pain   -voltaren gel   -splint during day as rest.   -can inject if doesn't improve   20  minutes of face to face patient care time were spent during this visit. All questions were encouraged and answered.  Follow up with me in 2 mos .

## 2023-07-30 ENCOUNTER — Ambulatory Visit: Payer: PPO | Admitting: Physical Therapy

## 2023-07-30 DIAGNOSIS — M5416 Radiculopathy, lumbar region: Secondary | ICD-10-CM | POA: Diagnosis not present

## 2023-08-01 ENCOUNTER — Other Ambulatory Visit (HOSPITAL_COMMUNITY): Payer: Self-pay

## 2023-08-01 ENCOUNTER — Other Ambulatory Visit: Payer: Self-pay

## 2023-08-04 ENCOUNTER — Ambulatory Visit: Payer: PPO | Admitting: Physical Therapy

## 2023-08-04 DIAGNOSIS — R2689 Other abnormalities of gait and mobility: Secondary | ICD-10-CM | POA: Diagnosis not present

## 2023-08-04 DIAGNOSIS — M6281 Muscle weakness (generalized): Secondary | ICD-10-CM

## 2023-08-04 DIAGNOSIS — R2681 Unsteadiness on feet: Secondary | ICD-10-CM

## 2023-08-04 NOTE — Therapy (Unsigned)
OUTPATIENT PHYSICAL THERAPY NEURO TREATMENT NOTE   Patient Name: [name] MRN: [id number] DOB:1961-10-06, 62 y.o., female Today's Date: 08/05/2023   PCP: [place], [name]., MD REFERRING PROVIDER: Ranelle Oyster, MD  END OF SESSION:  PT End of Session - 08/05/23 1733     Visit Number 19    Number of Visits 27    Date for PT Re-Evaluation 08/27/23    Authorization Type [employer]    Authorization Time Period 04-21-23 - 06-27-23;  06-25-23 - 08-27-23    Progress Note Due on Visit 10    PT Start Time 0934    PT Stop Time 1017    PT Time Calculation (min) 43 min    Equipment Utilized During Treatment Gait belt    Activity Tolerance Patient tolerated treatment well   limited by RLE weakness   Behavior During Therapy [facility] for tasks assessed/performed                              Past Medical History:  Diagnosis Date   Anxiety    Aortic atherosclerosis (HCC) 04/16/2021   Atypical angina (HCC)    Back pain    related to spinal stenosis and disc problem, radiates down left buttocks to leg., weakness occ.   Chest pain    a. 03/2015 Cath: nl cors; b. 03/2021 Cor CTA: Ca2+ = 0. Nl Cors.   Chronic pain syndrome    Dyspnea    GERD (gastroesophageal reflux disease)    Grade I diastolic dysfunction    Headache    Hyperlipidemia    Hypertension    Lumbar post-laminectomy syndrome    LVH (left ventricular hypertrophy) 12/15/2020   a. 11/2020 Echo: EF 65-70%, no rwma, sev asymm LVH with IVSd 1.9 cm. No LVOT obs @ rest. Gr1 DD. Triv MR.   PONV (postoperative nausea and vomiting)    Pulmonary nodules    a. 03/2021 CT Chest: 3mm pulm nodules in bilat lower lobes. F/u 1 yr.   Right foot drop    Syncope    a. 11/2020 Zio: No significant arrhythmias.   Vaginal foreign object    "Uses Femring"   Past Surgical History:  Procedure Laterality Date   ABDOMINAL HYSTERECTOMY     ANTERIOR CERVICAL DECOMP/DISCECTOMY FUSION N/A 01/03/2022   Procedure:  Anterior Cervical Decompression Fusion - Cervical four-Cervical five - Cervical five-Cervical six;  Surgeon: Tia Alert, MD;  Location: [facility] OR;  Service: Neurosurgery;  Laterality: N/A;   BIOPSY  12/16/2020   Procedure: BIOPSY;  Surgeon: Meryl Dare, MD;  Location: [facility] ENDOSCOPY;  Service: Endoscopy;;   CARDIAC CATHETERIZATION N/A 04/18/2015   Procedure: Left Heart Cath and Coronary Angiography;  Surgeon: Rinaldo Cloud, MD;  Location: [facility] INVASIVE CV LAB;  Service: Cardiovascular;  Laterality: N/A;   COLONOSCOPY     COLONOSCOPY W/ BIOPSIES AND POLYPECTOMY  2018   ESOPHAGOGASTRODUODENOSCOPY (EGD) WITH PROPOFOL N/A 12/16/2020   Procedure: ESOPHAGOGASTRODUODENOSCOPY (EGD) WITH PROPOFOL;  Surgeon: Meryl Dare, MD;  Location: [facility] ENDOSCOPY;  Service: Endoscopy;  Laterality: N/A;   FOOT SURGERY Bilateral    [facility] "bunion,bone spur, tendon" (1) -6'16, (1)-10'16   HEMATOMA EVACUATION N/A 01/05/2022   Procedure: Cervical Wound Exploration;  Surgeon: Coletta Memos, MD;  Location: [facility] OR;  Service: Neurosurgery;  Laterality: N/A;   IR EPIDUROGRAPHY  07/21/2018   LUMBAR LAMINECTOMY/DECOMPRESSION MICRODISCECTOMY Bilateral 12/28/2015   Procedure: MICRO LUMBAR DECOMPRESSION L4 - L5 BILATERALLY;  Surgeon: Jene Every, MD;  Location: [facility] ORS;  Service: Orthopedics;  Laterality: Bilateral;   LUMBAR LAMINECTOMY/DECOMPRESSION MICRODISCECTOMY Bilateral 03/04/2018   Procedure: Revision of Microlumbar Decompression Bilateral Lumbar Four-Five;  Surgeon: Jene Every, MD;  Location: [facility] OR;  Service: Orthopedics;  Laterality: Bilateral;  90 mins   SAVORY DILATION N/A 12/16/2020   Procedure: SAVORY DILATION;  Surgeon: Meryl Dare, MD;  Location: [facility] ENDOSCOPY;  Service: Endoscopy;  Laterality: N/A;   SPINAL CORD STIMULATOR INSERTION N/A 09/28/2019   Procedure: THORACIC SPINAL CORD STIMULATOR INSERTION;  Surgeon: Venita Lick, MD;  Location: [facility] OR;  Service: Orthopedics;  Laterality: N/A;  2.5  hrs   SPINAL CORD STIMULATOR REMOVAL N/A 05/27/2021   Procedure: LUMBAR SPINAL CORD STIMULATOR REMOVAL;  Surgeon: Lucy Chris, MD;  Location: [facility] ORS;  Service: Neurosurgery;  Laterality: N/A;   TUBAL LIGATION     WISDOM TOOTH EXTRACTION     WOUND EXPLORATION N/A 03/04/2018   Procedure: EXPLORATION OF LUMBAR DECOMPRESSION WOUND;  Surgeon: Jene Every, MD;  Location: [facility] OR;  Service: Orthopedics;  Laterality: N/A;   Patient Active Problem List   Diagnosis Date Noted   Localized primary osteoarthritis of carpometacarpal joint of right thumb 07/29/2023   Right foot drop 07/29/2023   Frequent falls 04/14/2023   Gastroesophageal reflux disease 04/14/2023   Right upper extremity numbness 09/29/2022   Stage 3a chronic kidney disease (HCC) 09/29/2022   Cervical spondylosis with radiculopathy 01/05/2022   S/P cervical spinal fusion 01/03/2022   Cervical disc disorder with radiculopathy of cervical region 11/27/2021   Uncontrolled pain 05/27/2021   Syncope 05/13/2021   Depression with anxiety 05/13/2021   Chronic diastolic CHF (congestive heart failure) (HCC) 05/13/2021   Chronic back pain 05/13/2021   Chest tightness 05/13/2021   Lower abdominal pain 05/13/2021   Syncope and collapse 01/01/2021   Abnormal echocardiogram 01/01/2021   BPPV (benign paroxysmal positional vertigo), left 12/26/2020   Odynophagia    Dysphagia 12/15/2020   Postural dizziness with presyncope 12/14/2020   Lumbar post-laminectomy syndrome    Chronic pain 09/28/2019   Lumbar spinal stenosis    Radicular pain    Gait disorder    Difficulty in urination 07/13/2018   Back pain 07/12/2018   Hyperlipidemia 04/16/2018   GAD (generalized anxiety disorder) 04/16/2018   AKI (acute kidney injury) (HCC)    Benign essential HTN    Major depression, recurrent (HCC)    Lumbar radiculopathy 03/09/2018   Paraparesis (HCC) 03/09/2018   Essential hypertension    Chronic nonintractable headache    Leukocytosis    Acute  blood loss anemia    Postoperative pain    Generalized weakness    Spinal stenosis at L4-L5 level 03/04/2018   Spinal stenosis of lumbar region 12/28/2015   Chest pain 04/16/2015    ONSET DATE: Referral date 04-01-23  REFERRING DIAG: M54.16 (ICD-10-CM) - Lumbar radiculopathy  THERAPY DIAG:  Other abnormalities of gait and mobility  Muscle weakness (generalized)  Unsteadiness on feet  Rationale for Evaluation and Treatment: Rehabilitation  SUBJECTIVE:  SUBJECTIVE STATEMENT: Pt reports she had MRI done on her back last Thursday, 07-30-23: says she will not get the results until she has the NCV done on her Rt wrist, and then will get both results at same time in 1 appt.; pt wearing wrist splint on Rt hand which she says really helps - is able to put pressure on Rt hand without as much pain  Pt accompanied by: self  PERTINENT HISTORY: Frequent falls:  Spinal cord stimulator insertion on 09-28-19; stimulator removal on 05-27-21 with resultant LLE weakness;  decompression and laminectomy on 12/28/15 at L4-L5  with redo decompression surgery on May 9,2019, post-op paraparesis with surgery to remove small hematoma; 07/12/18 fall at work (involved rolling chair) with hospital admission, transfer to CIR with d/c home 08/03/18; HTN, Bil foot surgery; left ventricular hypertrophy, chronic pain syndrome, dyspnea, hypertrophic cardiomyopathy; cervical fusion March 2023;  had carpal tunnel surgery RUE in April 2024:  LVH  PAIN:  08-04-23 Are you having pain?  Pt reports her back "bothers her"- is constant in occurrence; rates pain 4-5/10 intensity; worse with bending over; sleeping on heating pad helps Intensity: 1-2/10; 3-4/10 pain reported in Rt thenar eminence area of Rt hand Location:  low back Description: soreness in  low back Duration; varies depending on activity or movement being performed - worse with bending over or pulling leg up to chest Aggravating factors -worse with bending over or pulling leg up to chest Alleviating factor - heating pad helps some; pain medication  PRECAUTIONS: Fall  WEIGHT BEARING RESTRICTIONS: No  FALLS: Has patient fallen in last 6 months? Yes. Number of falls 3  LIVING ENVIRONMENT: Lives with: lives with their spouse Lives in: House/apartment Stairs: Yes: External: 5 steps; on right going up Has following equipment at home: Single point cane, Wheelchair (manual), and [place]strand crutch  PLOF: Independent with basic ADLs, Independent with household mobility with device, Independent with community mobility with device, and Independent with transfers  PATIENT GOALS: improve strength Rt leg  OBJECTIVE:   DIAGNOSTIC FINDINGS: IMPRESSION: 1. Interval anterior discectomy and fusion from C4 through C6 with improved patency of the spinal canal and only mild residual foraminal narrowing. 2. Stable mild spinal stenosis and mild to moderate left foraminal narrowing at C3-4. 3. Stable mild spinal stenosis and mild foraminal narrowing bilaterally at C6-7. Mild foraminal narrowing bilaterally at C7-T1. 4. No cord deformity or abnormal cord signal.  No acute findings. No recent MRI of lumbar region  LOWER EXTREMITY MMT:    MMT Right Eval Left Eval  Hip flexion 3+   Hip extension    Hip abduction    Hip adduction    Hip internal rotation    Hip external rotation    Knee flexion 3-   Knee extension 3-   Ankle dorsiflexion 2-   Ankle plantarflexion    Ankle inversion    Ankle eversion    (Blank rows = not tested)   Today's Treatment: 08-04-23    GAIT: Gait pattern: decreased step length- Right, decreased stance time- Right, decreased hip/knee flexion- Right, and decreased ankle dorsiflexion- Right Distance walked: 230' (2 laps) with rollator; clinic distances  during session between exercise locations Assistive device utilized: rollator  Level of assistance: SBA and CGA Comments: blue rocker AFO donned on RLE     05-07-23: Gait velocity: with [place] Walk on AFO - 1"18 secs with rollator = .42 ft/sec   With Toe off AFO 31.9 secs with rollator = 1.03 ft/sec  THEREX: RLE strengthening exercises:  Performed sit to stand transfer from mat table with Lt foot on 4" step  x 10 reps with RUE support on mat table  Step up exercise RLE onto 6" step placed inside // bars; pt performed 5 reps;  then needed seated rest period; changed to 4" step - pt performed 10 reps x 2 sets:  Rt foot remained on step for entire exercise during each set  RLE LAQ's with 2# weight 10 reps   Tap ups onto 4" step with LLE 10 reps for RLE closed chain strengthening with bil. UE support on // bars; progressed to 6" step - tap ups with Lt foot onto step 10 reps for RLE closed chain strengthening - min tactile cue to keep Rt knee slightly flexed to prevent hyperextension  Leg press 40# 5 reps with bil. LE's: 30# 10 reps x 2 sets - bil. LE's  RLE LAQ's x 10 reps after leg press exercise due to c/o cramping       HEP - Medbridge  Access Code: [email or web address] URL: [email or web address] Date: 05/06/2023 Prepared by: Maebelle Munroe  Exercises - Beginner Bridge  - 1 x daily - 7 x weekly - 1 sets - 10 reps - 3 hold - Supine Heel Slide  - 1 x daily - 7 x weekly - 1 sets - 10 reps - Bent Knee Fallouts  - 1 x daily - 7 x weekly - 1 sets - 10 reps - Sidelying Hip Abduction  - 1 x daily - 7 x weekly - 1 sets - 10 reps - Seated Knee Flexion Extension AROM   - 1 x daily - 7 x weekly - 1 sets - 10 reps   PATIENT EDUCATION: Education details:  discussed STG's and progress achieved Person educated: Patient Education method: Explanation, demonstration, handouts  Education comprehension: verbalized understanding, demonstrated understanding  HOME EXERCISE  PROGRAM: To be issued  GOALS: Goals reviewed with patient? Yes  SHORT TERM GOALS: Target date: 07-24-23   1.   Amb. 230' with RW with SBA without requiring seated rest period. Baseline:  Goal status: Goal met 07-21-23  2.  Perform sit to stand from mat without UE support to demo improved LE strength. Baseline:    Goal status:  Not met - ongoing - 07-21-23  3.  Modified independent step negotiation with use of bil. Hand rails using step by step sequence. Baseline: Goal status: Goal met 07-21-23  4.   Improve TUG score from 22.25 secs to </= 19 secs with use of rollator.  Baseline:  22.25 secs with rollator & blue rocker AFO on 06-25-23;    07-21-23=  32.78, 22.62 secs with rollator Goal status: Not met - ongoing - 07-21-23  5.   Stand for 1" without UE support to demo improved standing balance. Baseline:  Goal status: Goal met 07-21-23    LONG TERM GOALS: Target date: 07-20-23  Amb. 230' with RW with SBA without requiring seated rest period. Baseline: 56' with RW Goal status: Partially met - 06-23-23  2.  Improve TUG score by at least 4 secs with use of RW for assistance with ambulation Baseline: TBA;  46 secs on 7-11;  35.97 secs on 06-16-23;  22.25 secs with rollator with blue rocker AFO Goal status: Goal met 06-25-23  3.  Perform 5x sit to stand transfers with LUE support only to demo improved strength in RLE.  Baseline:  Goal status: Goal met 05-29-23 - RUE used   4.  Modified independent step negotiation with use of bil. Hand rails using step by step sequence. Baseline:  Goal status: 06-23-23 -- SBA to supervision needed for safety  5.  Obtain new AFO for increased RLE stability to decrease fall risk with gait. Baseline:  Goal status: In progress - delivery scheduled for 06-24-23; Goal met 06-25-23  6.  Independent in updated HEP for RLE strengthening. Baseline:  Goal status: Goal met 06-16-23   NEW LONG TERM GOALS:  Target date;  08-21-23    1.  Amb. 300' with RW with  blue rocker AFO on RLE with SBA without requiring seated rest period.    Baseline;  180', 40' with rollator    Status:  NEW  2.  Improve TUG score from 22.25 secs to </= 15 secs with use of rollator.  Baseline:  22.25 secs with rollator & blue rocker AFO on 06-25-23  Status:  NEW  3.  Pt will report ability to negotiate steps in her garage at home modified independently for safely entering & exiting home.  Baseline;  SBA to CGA  Status:  NEW  4.  Increase gait velocity to >/= 1.5 ft/sec with use of AFO & rollator for increased gait efficiency.  Baseline:  With Toe off AFO 31.9 secs with rollator = 1.03 ft/sec    Status:  NEW  5.  Independent in updated HEP for RLE strengthening.  Baseline:  Goal status: NEW  ASSESSMENT:  CLINICAL IMPRESSION: PT session focused on RLE strengthening exercises in standing/closed chain position.  Pt able to tolerate increased weight bearing on RUE due to use of wrist splint.  Pt unable to tolerate 3# weight for RLE LAQ exercise due to c/o discomfort in low back with this exercise, so 2# weight was used for this PRE.  Pt able to perform RLE step up exercise onto 6" step for 5 reps in today's session.  Cont with POC.    OBJECTIVE IMPAIRMENTS: Abnormal gait, decreased balance, decreased coordination, decreased endurance, decreased strength, and impaired tone.   ACTIVITY LIMITATIONS: carrying, bending, squatting, stairs, transfers, and locomotion level  PARTICIPATION LIMITATIONS: cleaning, laundry, shopping, and community activity  PERSONAL FACTORS: Behavior pattern, Past/current experiences, and Time since onset of injury/illness/exacerbation are also affecting patient's functional outcome.   REHAB POTENTIAL: Good  CLINICAL DECISION MAKING: Evolving/moderate complexity  EVALUATION COMPLEXITY: Moderate  PLAN:  PT FREQUENCY: 2x/week  PT DURATION: 8 weeks  PLANNED INTERVENTIONS: Therapeutic exercises, Therapeutic activity, Neuromuscular  re-education, Balance training, Gait training, Patient/Family education, Self Care, Stair training, Orthotic/Fit training, DME instructions, and Aquatic Therapy  PLAN FOR NEXT SESSION:  10th visit progress note due next session: continue RLE strengthening   Kary Kos, PT 08/05/2023, 6:52 PM

## 2023-08-05 ENCOUNTER — Encounter: Payer: Self-pay | Admitting: Physical Therapy

## 2023-08-06 ENCOUNTER — Encounter: Payer: Self-pay | Admitting: Physical Therapy

## 2023-08-06 ENCOUNTER — Ambulatory Visit: Payer: PPO | Admitting: Physical Therapy

## 2023-08-06 DIAGNOSIS — M6281 Muscle weakness (generalized): Secondary | ICD-10-CM

## 2023-08-06 DIAGNOSIS — R2689 Other abnormalities of gait and mobility: Secondary | ICD-10-CM | POA: Diagnosis not present

## 2023-08-06 NOTE — Therapy (Signed)
OUTPATIENT PHYSICAL THERAPY NEURO TREATMENT NOTE/20th Visit Progress Note   Progress Note Reporting Period 06-25-23 to 08-06-23  See note below for Objective Data and Assessment of Progress/Goals.  Thank you for the referral of this patient    Patient Name: [name] MRN: [id number] DOB:08/24/1961, 62 y.o., female Today's Date: 08/07/2023   PCP: [place], [name]., MD REFERRING PROVIDER: Ranelle Oyster, MD  END OF SESSION:  PT End of Session - 08/06/23 0927     Visit Number 20    Number of Visits 27    Date for PT Re-Evaluation 08/27/23    Authorization Type [employer]    Authorization Time Period 04-21-23 - 06-27-23;  06-25-23 - 08-27-23    Progress Note Due on Visit 10    PT Start Time 0929    PT Stop Time 1016    PT Time Calculation (min) 47 min    Equipment Utilized During Treatment Gait belt    Activity Tolerance Patient tolerated treatment well   limited by RLE weakness   Behavior During Therapy [facility] for tasks assessed/performed                              Past Medical History:  Diagnosis Date   Anxiety    Aortic atherosclerosis (HCC) 04/16/2021   Atypical angina (HCC)    Back pain    related to spinal stenosis and disc problem, radiates down left buttocks to leg., weakness occ.   Chest pain    a. 03/2015 Cath: nl cors; b. 03/2021 Cor CTA: Ca2+ = 0. Nl Cors.   Chronic pain syndrome    Dyspnea    GERD (gastroesophageal reflux disease)    Grade I diastolic dysfunction    Headache    Hyperlipidemia    Hypertension    Lumbar post-laminectomy syndrome    LVH (left ventricular hypertrophy) 12/15/2020   a. 11/2020 Echo: EF 65-70%, no rwma, sev asymm LVH with IVSd 1.9 cm. No LVOT obs @ rest. Gr1 DD. Triv MR.   PONV (postoperative nausea and vomiting)    Pulmonary nodules    a. 03/2021 CT Chest: 3mm pulm nodules in bilat lower lobes. F/u 1 yr.   Right foot drop    Syncope    a. 11/2020 Zio: No significant arrhythmias.   Vaginal  foreign object    "Uses Femring"   Past Surgical History:  Procedure Laterality Date   ABDOMINAL HYSTERECTOMY     ANTERIOR CERVICAL DECOMP/DISCECTOMY FUSION N/A 01/03/2022   Procedure: Anterior Cervical Decompression Fusion - Cervical four-Cervical five - Cervical five-Cervical six;  Surgeon: Tia Alert, MD;  Location: [facility] OR;  Service: Neurosurgery;  Laterality: N/A;   BIOPSY  12/16/2020   Procedure: BIOPSY;  Surgeon: Meryl Dare, MD;  Location: [facility] ENDOSCOPY;  Service: Endoscopy;;   CARDIAC CATHETERIZATION N/A 04/18/2015   Procedure: Left Heart Cath and Coronary Angiography;  Surgeon: Rinaldo Cloud, MD;  Location: [facility] INVASIVE CV LAB;  Service: Cardiovascular;  Laterality: N/A;   COLONOSCOPY     COLONOSCOPY W/ BIOPSIES AND POLYPECTOMY  2018   ESOPHAGOGASTRODUODENOSCOPY (EGD) WITH PROPOFOL N/A 12/16/2020   Procedure: ESOPHAGOGASTRODUODENOSCOPY (EGD) WITH PROPOFOL;  Surgeon: Meryl Dare, MD;  Location: [facility] ENDOSCOPY;  Service: Endoscopy;  Laterality: N/A;   FOOT SURGERY Bilateral    [facility] "bunion,bone spur, tendon" (1) -6'16, (1)-10'16   HEMATOMA EVACUATION N/A 01/05/2022   Procedure: Cervical Wound Exploration;  Surgeon: Coletta Memos, MD;  Location: [facility] OR;  Service: Neurosurgery;  Laterality: N/A;   IR EPIDUROGRAPHY  07/21/2018   LUMBAR LAMINECTOMY/DECOMPRESSION MICRODISCECTOMY Bilateral 12/28/2015   Procedure: MICRO LUMBAR DECOMPRESSION L4 - L5 BILATERALLY;  Surgeon: Jene Every, MD;  Location: [facility] ORS;  Service: Orthopedics;  Laterality: Bilateral;   LUMBAR LAMINECTOMY/DECOMPRESSION MICRODISCECTOMY Bilateral 03/04/2018   Procedure: Revision of Microlumbar Decompression Bilateral Lumbar Four-Five;  Surgeon: Jene Every, MD;  Location: [facility] OR;  Service: Orthopedics;  Laterality: Bilateral;  90 mins   SAVORY DILATION N/A 12/16/2020   Procedure: SAVORY DILATION;  Surgeon: Meryl Dare, MD;  Location: [facility] ENDOSCOPY;  Service: Endoscopy;  Laterality: N/A;   SPINAL  CORD STIMULATOR INSERTION N/A 09/28/2019   Procedure: THORACIC SPINAL CORD STIMULATOR INSERTION;  Surgeon: Venita Lick, MD;  Location: [facility] OR;  Service: Orthopedics;  Laterality: N/A;  2.5 hrs   SPINAL CORD STIMULATOR REMOVAL N/A 05/27/2021   Procedure: LUMBAR SPINAL CORD STIMULATOR REMOVAL;  Surgeon: Lucy Chris, MD;  Location: [facility] ORS;  Service: Neurosurgery;  Laterality: N/A;   TUBAL LIGATION     WISDOM TOOTH EXTRACTION     WOUND EXPLORATION N/A 03/04/2018   Procedure: EXPLORATION OF LUMBAR DECOMPRESSION WOUND;  Surgeon: Jene Every, MD;  Location: [facility] OR;  Service: Orthopedics;  Laterality: N/A;   Patient Active Problem List   Diagnosis Date Noted   Localized primary osteoarthritis of carpometacarpal joint of right thumb 07/29/2023   Right foot drop 07/29/2023   Frequent falls 04/14/2023   Gastroesophageal reflux disease 04/14/2023   Right upper extremity numbness 09/29/2022   Stage 3a chronic kidney disease (HCC) 09/29/2022   Cervical spondylosis with radiculopathy 01/05/2022   S/P cervical spinal fusion 01/03/2022   Cervical disc disorder with radiculopathy of cervical region 11/27/2021   Uncontrolled pain 05/27/2021   Syncope 05/13/2021   Depression with anxiety 05/13/2021   Chronic diastolic CHF (congestive heart failure) (HCC) 05/13/2021   Chronic back pain 05/13/2021   Chest tightness 05/13/2021   Lower abdominal pain 05/13/2021   Syncope and collapse 01/01/2021   Abnormal echocardiogram 01/01/2021   BPPV (benign paroxysmal positional vertigo), left 12/26/2020   Odynophagia    Dysphagia 12/15/2020   Postural dizziness with presyncope 12/14/2020   Lumbar post-laminectomy syndrome    Chronic pain 09/28/2019   Lumbar spinal stenosis    Radicular pain    Gait disorder    Difficulty in urination 07/13/2018   Back pain 07/12/2018   Hyperlipidemia 04/16/2018   GAD (generalized anxiety disorder) 04/16/2018   AKI (acute kidney injury) (HCC)    Benign essential HTN     Major depression, recurrent (HCC)    Lumbar radiculopathy 03/09/2018   Paraparesis (HCC) 03/09/2018   Essential hypertension    Chronic nonintractable headache    Leukocytosis    Acute blood loss anemia    Postoperative pain    Generalized weakness    Spinal stenosis at L4-L5 level 03/04/2018   Spinal stenosis of lumbar region 12/28/2015   Chest pain 04/16/2015    ONSET DATE: Referral date 04-01-23  REFERRING DIAG: M54.16 (ICD-10-CM) - Lumbar radiculopathy  THERAPY DIAG:  Other abnormalities of gait and mobility  Muscle weakness (generalized)  Rationale for Evaluation and Treatment: Rehabilitation  SUBJECTIVE:  SUBJECTIVE STATEMENT: Pt reports she is still having pain in her Rt hand but it has been better since she has been wearing the wrist splint  Pt accompanied by: self  PERTINENT HISTORY: Frequent falls:  Spinal cord stimulator insertion on 09-28-19; stimulator removal on 05-27-21 with resultant LLE weakness;  decompression and laminectomy on 12/28/15 at L4-L5  with redo decompression surgery on May 9,2019, post-op paraparesis with surgery to remove small hematoma; 07/12/18 fall at work (involved rolling chair) with hospital admission, transfer to CIR with d/c home 08/03/18; HTN, Bil foot surgery; left ventricular hypertrophy, chronic pain syndrome, dyspnea, hypertrophic cardiomyopathy; cervical fusion March 2023;  had carpal tunnel surgery RUE in April 2024:  LVH  PAIN:  08-06-23 Are you having pain?  Pt reports her back "bothers her"- is constant in occurrence; rates pain 4-5/10 intensity; worse with bending over; sleeping on heating pad helps Intensity: 1/10;  2/10 pain reported in Rt thenar eminence area of Rt hand Location:  low back Description: soreness in low back Duration; varies  depending on activity or movement being performed - worse with bending over or pulling leg up to chest Aggravating factors -worse with bending over or pulling leg up to chest Alleviating factor - heating pad helps some; pain medication  PRECAUTIONS: Fall  WEIGHT BEARING RESTRICTIONS: No  FALLS: Has patient fallen in last 6 months? Yes. Number of falls 3  LIVING ENVIRONMENT: Lives with: lives with their spouse Lives in: House/apartment Stairs: Yes: External: 5 steps; on right going up Has following equipment at home: Single point cane, Wheelchair (manual), and [place]strand crutch  PLOF: Independent with basic ADLs, Independent with household mobility with device, Independent with community mobility with device, and Independent with transfers  PATIENT GOALS: improve strength Rt leg  OBJECTIVE:   DIAGNOSTIC FINDINGS: IMPRESSION: 1. Interval anterior discectomy and fusion from C4 through C6 with improved patency of the spinal canal and only mild residual foraminal narrowing. 2. Stable mild spinal stenosis and mild to moderate left foraminal narrowing at C3-4. 3. Stable mild spinal stenosis and mild foraminal narrowing bilaterally at C6-7. Mild foraminal narrowing bilaterally at C7-T1. 4. No cord deformity or abnormal cord signal.  No acute findings. No recent MRI of lumbar region  LOWER EXTREMITY MMT:    MMT Right Eval Left Eval  Hip flexion 3+   Hip extension    Hip abduction    Hip adduction    Hip internal rotation    Hip external rotation    Knee flexion 3-   Knee extension 3-   Ankle dorsiflexion 2-   Ankle plantarflexion    Ankle inversion    Ankle eversion    (Blank rows = not tested)   Today's Treatment: 08-06-23    GAIT: Gait pattern: decreased step length- Right, decreased stance time- Right, decreased hip/knee flexion- Right, and decreased ankle dorsiflexion- Right Distance walked: 230' (2 laps) with rollator; clinic distances during session between  exercise locations Assistive device utilized: rollator  Level of assistance: SBA and CGA Comments: blue rocker AFO donned on RLE     05-07-23: Gait velocity: with [place] Walk on AFO - 1"18 secs with rollator = .42 ft/sec   With Toe off AFO 31.9 secs with rollator = 1.03 ft/sec      THEREX: RLE strengthening exercises:  RLE LAQ's with 2# weight 10 reps - seated in chair Rt Hip flexion 2# 10 reps - seated in chair  Step up exercise RLE onto 6" step placed inside // bars; pt  performed 6 reps;  then needed seated rest period; changed to 4" step - pt performed 10 reps x 2 sets:  Rt foot remained on step for entire exercise during each set  Step down from 2" step with LLE for Rt quad eccentric 10 reps - with bil. UE support   Tap ups onto 4" step with LLE 10 reps for RLE closed chain strengthening with bil. UE support on // bars; min tactile cue to keep Rt knee slightly flexed to prevent hyperextension  Inside // bars with bil. UE support: Stepping forward 10 reps with LLE - inside // bars with bil. UE's Stepping laterally LLE 6 REPS, 10 REPS 1/2 MARCHING LLE 5 REPS  Leg press 30#  bil. LE's: 10 reps x 2 sets  RLE LAQ's x 10 reps after leg press exercise for stretching      HEP - Medbridge  Access Code: [email or web address] URL: [email or web address] Date: 05/06/2023 Prepared by: Maebelle Munroe  Exercises - Beginner Bridge  - 1 x daily - 7 x weekly - 1 sets - 10 reps - 3 hold - Supine Heel Slide  - 1 x daily - 7 x weekly - 1 sets - 10 reps - Bent Knee Fallouts  - 1 x daily - 7 x weekly - 1 sets - 10 reps - Sidelying Hip Abduction  - 1 x daily - 7 x weekly - 1 sets - 10 reps - Seated Knee Flexion Extension AROM   - 1 x daily - 7 x weekly - 1 sets - 10 reps   PATIENT EDUCATION: Education details:  discussed STG's and progress achieved Person educated: Patient Education method: Explanation, demonstration, handouts  Education comprehension: verbalized understanding,  demonstrated understanding  HOME EXERCISE PROGRAM: To be issued  GOALS: Goals reviewed with patient? Yes  SHORT TERM GOALS: Target date: 07-24-23   1.   Amb. 230' with RW with SBA without requiring seated rest period. Baseline:  Goal status: Goal met 07-21-23  2.  Perform sit to stand from mat without UE support to demo improved LE strength. Baseline:    Goal status:  Not met - ongoing - 07-21-23  3.  Modified independent step negotiation with use of bil. Hand rails using step by step sequence. Baseline: Goal status: Goal met 07-21-23  4.   Improve TUG score from 22.25 secs to </= 19 secs with use of rollator.  Baseline:  22.25 secs with rollator & blue rocker AFO on 06-25-23;    07-21-23=  32.78, 22.62 secs with rollator Goal status: Not met - ongoing - 07-21-23  5.   Stand for 1" without UE support to demo improved standing balance. Baseline:  Goal status: Goal met 07-21-23    LONG TERM GOALS: Target date: 07-20-23  Amb. 230' with RW with SBA without requiring seated rest period. Baseline: 57' with RW Goal status: Partially met - 06-23-23  2.  Improve TUG score by at least 4 secs with use of RW for assistance with ambulation Baseline: TBA;  46 secs on 7-11;  35.97 secs on 06-16-23;  22.25 secs with rollator with blue rocker AFO Goal status: Goal met 06-25-23  3.  Perform 5x sit to stand transfers with LUE support only to demo improved strength in RLE.  Baseline:  Goal status: Goal met 05-29-23 - RUE used   4.  Modified independent step negotiation with use of bil. Hand rails using step by step sequence. Baseline:  Goal status: 06-23-23 -- SBA to supervision needed for safety  5.  Obtain new AFO for increased RLE stability to decrease fall risk with gait. Baseline:  Goal status: In progress - delivery scheduled for 06-24-23; Goal met 06-25-23  6.  Independent in updated HEP for RLE strengthening. Baseline:  Goal status: Goal met 06-16-23   NEW LONG TERM GOALS:  Target date;   08-21-23    1.  Amb. 300' with RW with blue rocker AFO on RLE with SBA without requiring seated rest period.    Baseline;  180', 40' with rollator    Status:  NEW  2.  Improve TUG score from 22.25 secs to </= 15 secs with use of rollator.  Baseline:  22.25 secs with rollator & blue rocker AFO on 06-25-23  Status:  NEW  3.  Pt will report ability to negotiate steps in her garage at home modified independently for safely entering & exiting home.  Baseline;  SBA to CGA  Status:  NEW  4.  Increase gait velocity to >/= 1.5 ft/sec with use of AFO & rollator for increased gait efficiency.  Baseline:  With Toe off AFO 31.9 secs with rollator = 1.03 ft/sec    Status:  NEW  5.  Independent in updated HEP for RLE strengthening.  Baseline:  Goal status: NEW  ASSESSMENT:  CLINICAL IMPRESSION: This 20th visit progress note covers dates 06-25-23 - 08-07-23.  Pt received her Blue Rocker AFO on 06-24-23.  Pt has met STG's #1, 3 and 5:  :  STG's #2 and 4 not met due to decreased gait velocity due to RLE weakness.  Pt's gait is much improved and safer with use of Blue Rocker AFO on RLE, however, pt continues to have Rt knee and ankle musc. Weakness with significantly decreased muscle endurance.  Pt continues to need rollator for assistance with ambulation with pt able to amb. Approx. 230' prior to fatigue with need for seated rest period.  RLE strength is increasing slowly.   Cont with POC.    OBJECTIVE IMPAIRMENTS: Abnormal gait, decreased balance, decreased coordination, decreased endurance, decreased strength, and impaired tone.   ACTIVITY LIMITATIONS: carrying, bending, squatting, stairs, transfers, and locomotion level  PARTICIPATION LIMITATIONS: cleaning, laundry, shopping, and community activity  PERSONAL FACTORS: Behavior pattern, Past/current experiences, and Time since onset of injury/illness/exacerbation are also affecting patient's functional outcome.   REHAB POTENTIAL: Good  CLINICAL  DECISION MAKING: Evolving/moderate complexity  EVALUATION COMPLEXITY: Moderate  PLAN:  PT FREQUENCY: 2x/week  PT DURATION: 8 weeks  PLANNED INTERVENTIONS: Therapeutic exercises, Therapeutic activity, Neuromuscular re-education, Balance training, Gait training, Patient/Family education, Self Care, Stair training, Orthotic/Fit training, DME instructions, and Aquatic Therapy  PLAN FOR NEXT SESSION:  continue RLE strengthening   Kary Kos, PT 08/07/2023, 3:01 PM

## 2023-08-07 ENCOUNTER — Other Ambulatory Visit (HOSPITAL_COMMUNITY): Payer: Self-pay

## 2023-08-07 ENCOUNTER — Ambulatory Visit: Payer: PPO | Admitting: Neurology

## 2023-08-07 ENCOUNTER — Encounter: Payer: Self-pay | Admitting: Physical Therapy

## 2023-08-07 ENCOUNTER — Other Ambulatory Visit: Payer: Self-pay

## 2023-08-07 ENCOUNTER — Encounter: Payer: Self-pay | Admitting: Neurology

## 2023-08-07 VITALS — BP 137/81 | HR 85 | Ht 65.0 in | Wt 170.0 lb

## 2023-08-07 DIAGNOSIS — G5603 Carpal tunnel syndrome, bilateral upper limbs: Secondary | ICD-10-CM | POA: Diagnosis not present

## 2023-08-07 MED ORDER — PREGABALIN 300 MG PO CAPS
300.0000 mg | ORAL_CAPSULE | Freq: Three times a day (TID) | ORAL | 5 refills | Status: DC
  Filled 2023-08-07: qty 90, 30d supply, fill #0

## 2023-08-07 MED ORDER — DULOXETINE HCL 30 MG PO CPEP
30.0000 mg | ORAL_CAPSULE | Freq: Every day | ORAL | 3 refills | Status: DC
  Filled 2023-08-07: qty 90, 90d supply, fill #0

## 2023-08-07 MED ORDER — PREGABALIN 300 MG PO CAPS
300.0000 mg | ORAL_CAPSULE | Freq: Two times a day (BID) | ORAL | 5 refills | Status: DC
  Filled 2023-08-07: qty 60, 30d supply, fill #0

## 2023-08-07 NOTE — Progress Notes (Signed)
[facility]  PATIENT: [name] DOB: [birth year]  REQUESTING CLINICIAN: Arman Bogus, MD HISTORY FROM: Patient  REASON FOR VISIT: Bilateral carpal tunnel    HISTORICAL  CHIEF COMPLAINT:  Chief Complaint  Patient presents with   New Patient (Initial Visit)    Rm 12. Patient alone. RUE painful, takes tramadol to sleep but doesn't really help the pain. Was diagnosed with Carpel tunnel at the beginning of the year.     HISTORY OF PRESENT ILLNESS:  This is a 61 year old woman with with multiple medical conditions including hypertension, history of back injury resulting in right foot drop, abnormal gait, right hand carpal tunnel who is presenting with right hand pain.  Patient reports being diagnosed with carpal tunnel and already had carpal tunnel release surgery on the right.  After her surgery, she did have pain relief but about a few months ago the pain has returned.  She reports pain involving the whole right hand.  There is also pain in the left hand.  She does use a wrist brace but with her rolling walker it seems like her pain is worsening.  She did follow with her hand surgeon who recommended obtaining an updated EMG nerve conduction study.  She is on gabapentin 600 mg 3 times daily but reports pain is not has not improved.  She has difficulty holding things with her right hand, writing, opening bottle.  Holding on the rolling walker also worsens her pain.    OTHER MEDICAL CONDITIONS: Hypertension, history of back injury, right foot drop, abnormal gait   REVIEW OF SYSTEMS: Full 14 system review of systems performed and negative with exception of: As noted in the HPI   ALLERGIES: Allergies  Allergen Reactions   Cephalosporins Anaphylaxis   Penicillins Anaphylaxis and Hives    Did it involve swelling of the face/tongue/throat, SOB, or low BP? Yes  Did it involve sudden or severe rash/hives, skin peeling, or any reaction on the inside of  your mouth or nose? Yes  Did you need to seek medical attention at a hospital or doctor's office? Yes  When did it last happen?      10+ years  If all above answers are "NO", may proceed with cephalosporin use.  Did it involve swelling of the face/tongue/throat, SOB, or low BP? Yes Did it involve sudden or severe rash/hives, skin peeling, or any reaction on the inside of your mouth or nose? Yes Did you need to seek medical attention at a hospital or doctor's office? Yes When did it last happen?      10+ years If all above answers are "NO", may proceed with cephalosporin use.   Anesthetics, Amide Nausea And Vomiting    Does not know name of it. States they put it on record [facility].    Betadine [Povidone Iodine] Other (See Comments)    "Skin Burn" caused scar on Left buttock   Latex Hives, Itching, Rash and Other (See Comments)   Peach [Prunus Persica] Hives   Claritin [Loratadine]     Rash, pruritis     HOME MEDICATIONS: Outpatient Medications Prior to Visit  Medication Sig Dispense Refill   acetaminophen (TYLENOL) 500 MG tablet Take 1,000 mg by mouth every 6 (six) hours as needed for headache.     ALPRAZolam (XANAX) 0.5 MG tablet Take 1/2 - 1 tablet by mouth at bedtime as needed for anxiety. 30 tablet 3   amLODipine (NORVASC) 5 MG tablet Take 1 tablet (5 mg total)  by mouth daily. 90 tablet 2   atorvastatin (LIPITOR) 20 MG tablet Take 1 tablet (20 mg total) by mouth daily. 90 tablet 2   baclofen (LIORESAL) 10 MG tablet Take 1 tablet by mouth 3 (three) times daily as needed. (Patient not taking: Reported on 08/07/2023)     busPIRone (BUSPAR) 5 MG tablet Take 1 tablet by mouth 2  times daily. (Patient taking differently: Take 5 mg by mouth 2 (two) times daily as needed (anxiety).) 180 tablet 1   carvedilol (COREG) 25 MG tablet Take 1 tablet (25 mg total) by mouth 2 (two) times daily with a meal. 180 tablet 3   cholecalciferol (VITAMIN D3) 25 MCG (1000 UNIT) tablet Take 1,000 Units by  mouth daily.     clotrimazole-betamethasone (LOTRISONE) cream APPLY TO THE AFFECTED AND SURROUNDING AREAS OF SKIN BY TOPICAL ROUTE 2 TIMES PER DAY IN THE MORNING AND EVENING FOR 2 WEEKS 45 g 0   diazepam (VALIUM) 5 MG tablet Take 1 tablet (5 mg total) by mouth  30 minutes prior to MRI as directed. 1 tablet 0   diphenhydrAMINE (BENADRYL) 25 MG tablet Take 25 mg by mouth daily as needed for itching.     Estradiol Acetate (FEMRING) 0.05 MG/24HR RING USE ONE RING VAGINALLY EVERY 3 MONTHS AS DIRECTED 1 each 4   hydrALAZINE (APRESOLINE) 50 MG tablet Take 1 tablet (50 mg total) by mouth in the morning AND 1 tablet (50 mg total) at bedtime. 90 tablet 2   meclizine (ANTIVERT) 25 MG tablet Take 25 mg by mouth 3 (three) times daily as needed for dizziness.     methocarbamol (ROBAXIN) 500 MG tablet Take 1 tablet (500 mg total) by mouth 3 (three) times daily as needed for muscle spasms. (Patient not taking: Reported on 08/07/2023) 30 tablet 1   omeprazole (PRILOSEC) 40 MG capsule Take 1 capsule (40 mg total) by mouth daily. 90 capsule 1   ondansetron (ZOFRAN) 4 MG tablet Take 1 tablet (4 mg tablet) 30 - 60 minutes each prep dose 2 tablet 0   spironolactone (ALDACTONE) 50 MG tablet Take 1 tablet (50 mg total) by mouth daily. 90 tablet 3   telmisartan (MICARDIS) 40 MG tablet      traMADol (ULTRAM) 50 MG tablet Take by mouth.     amitriptyline (ELAVIL) 25 MG tablet Take 1 tablet (25 mg total) by mouth daily. 90 tablet 3   baclofen (LIORESAL) 10 MG tablet Take 1 tablet (10 mg total) by mouth 3 (three) times daily as needed. (Patient not taking: Reported on 08/07/2023) 90 tablet 3   betamethasone dipropionate (DIPROLENE) 0.05 % ointment Apply as directed to affected area twice a day. Taper use as able as directed. 45 g 3   betamethasone dipropionate 0.05 % cream Apply to itchy areas once daily as directed as needed. Avoid normal skin. (Patient not taking: Reported on 08/07/2023) 45 g 3   betamethasone dipropionate  0.05 % lotion Apply a small amount to affected area once a day (Patient not taking: Reported on 08/07/2023) 60 mL 0   gabapentin (NEURONTIN) 600 MG tablet Take 1 tablet (600 mg total) by mouth 3 (three) times daily. 90 tablet 3   HYDROcodone-acetaminophen (NORCO/VICODIN) 5-325 MG tablet Take 1 tablet by mouth every 6 hours as needed for pain. (Patient not taking: Reported on 08/07/2023) 20 tablet 0   methocarbamol (ROBAXIN) 500 MG tablet Take 1 tablet (500 mg total) by mouth 3 (three) times daily as needed for muscle spasms. (Patient not  taking: Reported on 08/07/2023) 30 tablet 1   methylPREDNISolone (MEDROL DOSEPAK) 4 MG TBPK tablet Take by mouth as directed on package (Patient not taking: Reported on 08/07/2023) 21 tablet 0   methylPREDNISolone (MEDROL DOSEPAK) 4 MG TBPK tablet take as directed (Patient not taking: Reported on 08/07/2023) 21 tablet 0   neomycin-bacitracin-polymyxin (NEOSPORIN) ointment Apply 1 application topically as needed for wound care. (Patient not taking: Reported on 08/07/2023)     oxyCODONE-acetaminophen (PERCOCET) 7.5-325 MG tablet Take 1 tablet by mouth daily as needed for severe pain. (Patient not taking: Reported on 08/07/2023)     No facility-administered medications prior to visit.    PAST MEDICAL HISTORY: Past Medical History:  Diagnosis Date   Anxiety    Aortic atherosclerosis (HCC) 04/16/2021   Atypical angina (HCC)    Back pain    related to spinal stenosis and disc problem, radiates down left buttocks to leg., weakness occ.   Chest pain    a. 03/2015 Cath: nl cors; b. 03/2021 Cor CTA: Ca2+ = 0. Nl Cors.   Chronic pain syndrome    Dyspnea    GERD (gastroesophageal reflux disease)    Grade I diastolic dysfunction    Headache    Hyperlipidemia    Hypertension    Lumbar post-laminectomy syndrome    LVH (left ventricular hypertrophy) 12/15/2020   a. 11/2020 Echo: EF 65-70%, no rwma, sev asymm LVH with IVSd 1.9 cm. No LVOT obs @ rest. Gr1 DD. Triv MR.    PONV (postoperative nausea and vomiting)    Pulmonary nodules    a. 03/2021 CT Chest: 3mm pulm nodules in bilat lower lobes. F/u 1 yr.   Right foot drop    Syncope    a. 11/2020 Zio: No significant arrhythmias.   Vaginal foreign object    "Uses Femring"    PAST SURGICAL HISTORY: Past Surgical History:  Procedure Laterality Date   ABDOMINAL HYSTERECTOMY     ANTERIOR CERVICAL DECOMP/DISCECTOMY FUSION N/A 01/03/2022   Procedure: Anterior Cervical Decompression Fusion - Cervical four-Cervical five - Cervical five-Cervical six;  Surgeon: Tia Alert, MD;  Location: [facility] OR;  Service: Neurosurgery;  Laterality: N/A;   BIOPSY  12/16/2020   Procedure: BIOPSY;  Surgeon: Meryl Dare, MD;  Location: [facility] ENDOSCOPY;  Service: Endoscopy;;   CARDIAC CATHETERIZATION N/A 04/18/2015   Procedure: Left Heart Cath and Coronary Angiography;  Surgeon: Rinaldo Cloud, MD;  Location: [facility] INVASIVE CV LAB;  Service: Cardiovascular;  Laterality: N/A;   COLONOSCOPY     COLONOSCOPY W/ BIOPSIES AND POLYPECTOMY  2018   ESOPHAGOGASTRODUODENOSCOPY (EGD) WITH PROPOFOL N/A 12/16/2020   Procedure: ESOPHAGOGASTRODUODENOSCOPY (EGD) WITH PROPOFOL;  Surgeon: Meryl Dare, MD;  Location: [facility] ENDOSCOPY;  Service: Endoscopy;  Laterality: N/A;   FOOT SURGERY Bilateral    [facility] "bunion,bone spur, tendon" (1) -6'16, (1)-10'16   HEMATOMA EVACUATION N/A 01/05/2022   Procedure: Cervical Wound Exploration;  Surgeon: Coletta Memos, MD;  Location: [facility] OR;  Service: Neurosurgery;  Laterality: N/A;   IR EPIDUROGRAPHY  07/21/2018   LUMBAR LAMINECTOMY/DECOMPRESSION MICRODISCECTOMY Bilateral 12/28/2015   Procedure: MICRO LUMBAR DECOMPRESSION L4 - L5 BILATERALLY;  Surgeon: Jene Every, MD;  Location: [facility] ORS;  Service: Orthopedics;  Laterality: Bilateral;   LUMBAR LAMINECTOMY/DECOMPRESSION MICRODISCECTOMY Bilateral 03/04/2018   Procedure: Revision of Microlumbar Decompression Bilateral Lumbar Four-Five;  Surgeon: Jene Every, MD;  Location: [facility] OR;  Service: Orthopedics;  Laterality: Bilateral;  90 mins   SAVORY DILATION N/A 12/16/2020   Procedure: SAVORY DILATION;  Surgeon:  Meryl Dare, MD;  Location: [facility] ENDOSCOPY;  Service: Endoscopy;  Laterality: N/A;   SPINAL CORD STIMULATOR INSERTION N/A 09/28/2019   Procedure: THORACIC SPINAL CORD STIMULATOR INSERTION;  Surgeon: Venita Lick, MD;  Location: [facility] OR;  Service: Orthopedics;  Laterality: N/A;  2.5 hrs   SPINAL CORD STIMULATOR REMOVAL N/A 05/27/2021   Procedure: LUMBAR SPINAL CORD STIMULATOR REMOVAL;  Surgeon: Lucy Chris, MD;  Location: [facility] ORS;  Service: Neurosurgery;  Laterality: N/A;   TUBAL LIGATION     WISDOM TOOTH EXTRACTION     WOUND EXPLORATION N/A 03/04/2018   Procedure: EXPLORATION OF LUMBAR DECOMPRESSION WOUND;  Surgeon: Jene Every, MD;  Location: [facility] OR;  Service: Orthopedics;  Laterality: N/A;    FAMILY HISTORY: Family History  Problem Relation Age of Onset   Heart attack Mother    Lung cancer Father    Cancer Father    Pancreatic cancer Sister    Breast cancer Sister 59   Multiple myeloma Sister    Breast cancer Sister        diagnosed in her 52's   Heart attack Sister    Throat cancer Brother    Lung cancer Brother    Stomach cancer Cousin    Colon cancer Neg Hx    Colon polyps Neg Hx    Esophageal cancer Neg Hx    Rectal cancer Neg Hx     SOCIAL HISTORY: Social History   Socioeconomic History   Marital status: Married    Spouse name: Soil scientist   Number of children: 1   Years of education: Not on file   Highest education level: Not on file  Occupational History   Not on file  Tobacco Use   Smoking status: Never   Smokeless tobacco: Never  Vaping Use   Vaping status: Never Used  Substance and Sexual Activity   Alcohol use: Never   Drug use: Never   Sexual activity: Yes    Partners: Male    Birth control/protection: Post-menopausal, Surgical  Other Topics Concern   Not on file  Social History  Narrative   Not on file   Social Determinants of Health   Financial Resource Strain: Low Risk  (09/29/2022)   Overall Financial Resource Strain (CARDIA)    Difficulty of Paying Living Expenses: Not hard at all  Food Insecurity: No Food Insecurity (09/29/2022)   Hunger Vital Sign    Worried About Running Out of Food in the Last Year: Never true    Ran Out of Food in the Last Year: Never true  Transportation Needs: No Transportation Needs (09/29/2022)   PRAPARE - Administrator, Civil Service (Medical): No    Lack of Transportation (Non-Medical): No  Physical Activity: Inactive (09/29/2022)   Exercise Vital Sign    Days of Exercise per Week: 0 days    Minutes of Exercise per Session: 0 min  Stress: No Stress Concern Present (09/29/2022)   [employer] of Occupational Health - Occupational Stress Questionnaire    Feeling of Stress : Not at all  Social Connections: Socially Integrated (09/29/2022)   Social Connection and Isolation Panel [NHANES]    Frequency of Communication with Friends and Family: More than three times a week    Frequency of Social Gatherings with Friends and Family: More than three times a week    Attends Religious Services: More than 4 times per year    Active Member of [employer] or Organizations: Yes    Attends Banker Meetings:  More than 4 times per year    Marital Status: Married  Catering manager Violence: Not At Risk (09/29/2022)   Humiliation, Afraid, Rape, and Kick questionnaire    Fear of Current or Ex-Partner: No    Emotionally Abused: No    Physically Abused: No    Sexually Abused: No    PHYSICAL EXAM  GENERAL EXAM/CONSTITUTIONAL: Vitals:  Vitals:   08/07/23 0815  BP: 137/81  Pulse: 85  Weight: 170 lb (77.1 kg)  Height: 5\' 5"  (1.651 m)   Body mass index is 28.29 kg/m. Wt Readings from Last 3 Encounters:  08/07/23 170 lb (77.1 kg)  06/23/23 169 lb (76.7 kg)  04/01/23 175 lb (79.4 kg)   Patient is in no distress; well  developed, nourished and groomed; neck is supple  MUSCULOSKELETAL: Gait, strength, tone, movements noted in Neurologic exam below  NEUROLOGIC: MENTAL STATUS:      No data to display         awake, alert, oriented to person, place and time recent and remote memory intact normal attention and concentration language fluent, comprehension intact, naming intact fund of knowledge appropriate  CRANIAL NERVE:  2nd, 3rd, 4th, 6th - Visual fields full to confrontation, extraocular muscles intact, no nystagmus 5th - facial sensation symmetric 7th - facial strength symmetric 8th - hearing intact 9th - palate elevates symmetrically, uvula midline 11th - shoulder shrug symmetric 12th - tongue protrusion midline  MOTOR:  normal bulk and tone, full strength in the BUE, BLE. There is decrease strength (or limited by pain) in the right finger intrinsic 4/5 on the right. Left hand is full. There is positive tinel's sign on the right.  There is a right foot drop  SENSORY:  normal and symmetric to light touch  COORDINATION:  finger-nose-finger, fine finger movements normal  GAIT/STATION:  Walks with a rolling walker    DIAGNOSTIC DATA (LABS, IMAGING, TESTING) - I reviewed patient records, labs, notes, testing and imaging myself where available.  Lab Results  Component Value Date   WBC 5.1 12/31/2021   HGB 13.1 12/31/2021   HCT 39.6 12/31/2021   MCV 90.6 12/31/2021   PLT 256 12/31/2021      Component Value Date/Time   NA 139 01/06/2023 1120   K 4.4 01/06/2023 1120   CL 101 01/06/2023 1120   CO2 28 01/06/2023 1120   GLUCOSE 76 01/06/2023 1120   GLUCOSE 89 09/29/2022 0953   BUN 13 01/06/2023 1120   CREATININE 1.12 (H) 01/06/2023 1120   CREATININE 0.99 04/01/2019 1535   CALCIUM 9.5 01/06/2023 1120   PROT 7.2 04/07/2022 0906   ALBUMIN 4.1 04/07/2022 0906   AST 18 04/07/2022 0906   ALT 14 04/07/2022 0906   ALKPHOS 70 04/07/2022 0906   BILITOT 0.4 04/07/2022 0906   GFRNONAA  58 (L) 12/31/2021 0931   GFRAA >60 09/23/2019 1207   Lab Results  Component Value Date   CHOL 187 04/07/2022   HDL 61.50 04/07/2022   LDLCALC 116 (H) 04/07/2022   TRIG 48.0 04/07/2022   CHOLHDL 3 04/07/2022   Lab Results  Component Value Date   HGBA1C 5.5 03/06/2018   Lab Results  Component Value Date   VITAMINB12 621 04/01/2019   Lab Results  Component Value Date   TSH 2.610 06/06/2022      ASSESSMENT AND PLAN  62 y.o. year old female with history of back pain, right foot drop, hypertension, carpal tunnel syndrome who is presenting with worsening pain from her carpal tunnel  symptoms despite having a carpal tunnel release surgery on the right.  On exam she does have decreased strength in the right hand intrinsic muscles, there is also limitation due to pain.  Plan will be to obtain a updated nerve conduction study of the bilateral upper extremities.  Will contact her to go over the results but we will most likely send her back to Dr. Yetta Barre. For the pain management, she is already on gabapentin 600 mg 3 times daily, will switch her to pregabalin 300 mg twice daily and also add duloxetine.  Pregabalin 300 mg 3 times daily was not approved by insurance.  I will contact her to go over the results of the EMG otherwise advised her to return in 6 months or sooner if worse.   1. Bilateral carpal tunnel syndrome      Patient Instructions  Switch gabapentin 600 mg 3 times daily to pregabalin 300 mg twice daily Start duloxetine 30 mg daily Continue your other medications Continue to wear wrist braces bilaterally EMG nerve conduction study of the bilateral upper extremities Continue to follow with your doctors Return in 6 months or sooner if worse.  Orders Placed This Encounter  Procedures   NCV with EMG(electromyography)    Meds ordered this encounter  Medications   DULoxetine (CYMBALTA) 30 MG capsule    Sig: Take 1 capsule (30 mg total) by mouth daily.    Dispense:  90  capsule    Refill:  3   DISCONTD: pregabalin (LYRICA) 300 MG capsule    Sig: Take 1 capsule (300 mg total) by mouth in the morning, at noon, and at bedtime.    Dispense:  90 capsule    Refill:  5   pregabalin (LYRICA) 300 MG capsule    Sig: Take 1 capsule (300 mg total) by mouth 2 (two) times daily.    Dispense:  60 capsule    Refill:  5    Return in about 6 months (around 02/05/2024).    Windell Norfolk, MD 08/07/2023, 1:18 PM  [facility] [address] [place], [place] [address] [phone]

## 2023-08-07 NOTE — Patient Instructions (Addendum)
Switch gabapentin 600 mg 3 times daily to pregabalin 300 mg twice daily Start duloxetine 30 mg daily Continue your other medications Continue to wear wrist braces bilaterally EMG nerve conduction study of the bilateral upper extremities Continue to follow with your doctors Return in 6 months or sooner if worse.

## 2023-08-10 ENCOUNTER — Other Ambulatory Visit (HOSPITAL_COMMUNITY): Payer: Self-pay

## 2023-08-10 ENCOUNTER — Other Ambulatory Visit: Payer: Self-pay

## 2023-08-11 ENCOUNTER — Ambulatory Visit: Payer: PPO | Admitting: Physical Therapy

## 2023-08-11 ENCOUNTER — Other Ambulatory Visit (HOSPITAL_COMMUNITY): Payer: Self-pay

## 2023-08-11 ENCOUNTER — Encounter: Payer: Self-pay | Admitting: Physical Therapy

## 2023-08-11 DIAGNOSIS — M6281 Muscle weakness (generalized): Secondary | ICD-10-CM

## 2023-08-11 DIAGNOSIS — R2689 Other abnormalities of gait and mobility: Secondary | ICD-10-CM | POA: Diagnosis not present

## 2023-08-11 NOTE — Therapy (Unsigned)
OUTPATIENT PHYSICAL THERAPY NEURO TREATMENT NOTE/20th Visit Progress Note   Progress Note Reporting Period 06-25-23 to 08-06-23  See note below for Objective Data and Assessment of Progress/Goals.  Thank you for the referral of this patient    Patient Name: [name] MRN: [id number] DOB:Jul 04, 1961, 62 y.o., female Today's Date: 08/11/2023   PCP: [place], [name]., MD REFERRING PROVIDER: Ranelle Oyster, MD  END OF SESSION:                     Past Medical History:  Diagnosis Date   Anxiety    Aortic atherosclerosis (HCC) 04/16/2021   Atypical angina (HCC)    Back pain    related to spinal stenosis and disc problem, radiates down left buttocks to leg., weakness occ.   Chest pain    a. 03/2015 Cath: nl cors; b. 03/2021 Cor CTA: Ca2+ = 0. Nl Cors.   Chronic pain syndrome    Dyspnea    GERD (gastroesophageal reflux disease)    Grade I diastolic dysfunction    Headache    Hyperlipidemia    Hypertension    Lumbar post-laminectomy syndrome    LVH (left ventricular hypertrophy) 12/15/2020   a. 11/2020 Echo: EF 65-70%, no rwma, sev asymm LVH with IVSd 1.9 cm. No LVOT obs @ rest. Gr1 DD. Triv MR.   PONV (postoperative nausea and vomiting)    Pulmonary nodules    a. 03/2021 CT Chest: 3mm pulm nodules in bilat lower lobes. F/u 1 yr.   Right foot drop    Syncope    a. 11/2020 Zio: No significant arrhythmias.   Vaginal foreign object    "Uses Femring"   Past Surgical History:  Procedure Laterality Date   ABDOMINAL HYSTERECTOMY     ANTERIOR CERVICAL DECOMP/DISCECTOMY FUSION N/A 01/03/2022   Procedure: Anterior Cervical Decompression Fusion - Cervical four-Cervical five - Cervical five-Cervical six;  Surgeon: Tia Alert, MD;  Location: [facility] OR;  Service: Neurosurgery;  Laterality: N/A;   BIOPSY  12/16/2020   Procedure: BIOPSY;  Surgeon: Meryl Dare, MD;  Location: [facility] ENDOSCOPY;  Service: Endoscopy;;   CARDIAC CATHETERIZATION N/A 04/18/2015    Procedure: Left Heart Cath and Coronary Angiography;  Surgeon: Rinaldo Cloud, MD;  Location: [facility] INVASIVE CV LAB;  Service: Cardiovascular;  Laterality: N/A;   COLONOSCOPY     COLONOSCOPY W/ BIOPSIES AND POLYPECTOMY  2018   ESOPHAGOGASTRODUODENOSCOPY (EGD) WITH PROPOFOL N/A 12/16/2020   Procedure: ESOPHAGOGASTRODUODENOSCOPY (EGD) WITH PROPOFOL;  Surgeon: Meryl Dare, MD;  Location: [facility] ENDOSCOPY;  Service: Endoscopy;  Laterality: N/A;   FOOT SURGERY Bilateral    [facility] "bunion,bone spur, tendon" (1) -6'16, (1)-10'16   HEMATOMA EVACUATION N/A 01/05/2022   Procedure: Cervical Wound Exploration;  Surgeon: Coletta Memos, MD;  Location: [facility] OR;  Service: Neurosurgery;  Laterality: N/A;   IR EPIDUROGRAPHY  07/21/2018   LUMBAR LAMINECTOMY/DECOMPRESSION MICRODISCECTOMY Bilateral 12/28/2015   Procedure: MICRO LUMBAR DECOMPRESSION L4 - L5 BILATERALLY;  Surgeon: Jene Every, MD;  Location: [facility] ORS;  Service: Orthopedics;  Laterality: Bilateral;   LUMBAR LAMINECTOMY/DECOMPRESSION MICRODISCECTOMY Bilateral 03/04/2018   Procedure: Revision of Microlumbar Decompression Bilateral Lumbar Four-Five;  Surgeon: Jene Every, MD;  Location: [facility] OR;  Service: Orthopedics;  Laterality: Bilateral;  90 mins   SAVORY DILATION N/A 12/16/2020   Procedure: SAVORY DILATION;  Surgeon: Meryl Dare, MD;  Location: [facility] ENDOSCOPY;  Service: Endoscopy;  Laterality: N/A;   SPINAL CORD STIMULATOR INSERTION N/A 09/28/2019   Procedure: THORACIC SPINAL CORD  STIMULATOR INSERTION;  Surgeon: Venita Lick, MD;  Location: [facility] OR;  Service: Orthopedics;  Laterality: N/A;  2.5 hrs   SPINAL CORD STIMULATOR REMOVAL N/A 05/27/2021   Procedure: LUMBAR SPINAL CORD STIMULATOR REMOVAL;  Surgeon: Lucy Chris, MD;  Location: [facility] ORS;  Service: Neurosurgery;  Laterality: N/A;   TUBAL LIGATION     WISDOM TOOTH EXTRACTION     WOUND EXPLORATION N/A 03/04/2018   Procedure: EXPLORATION OF LUMBAR DECOMPRESSION WOUND;  Surgeon:  Jene Every, MD;  Location: [facility] OR;  Service: Orthopedics;  Laterality: N/A;   Patient Active Problem List   Diagnosis Date Noted   Localized primary osteoarthritis of carpometacarpal joint of right thumb 07/29/2023   Right foot drop 07/29/2023   Frequent falls 04/14/2023   Gastroesophageal reflux disease 04/14/2023   Right upper extremity numbness 09/29/2022   Stage 3a chronic kidney disease (HCC) 09/29/2022   Cervical spondylosis with radiculopathy 01/05/2022   S/P cervical spinal fusion 01/03/2022   Cervical disc disorder with radiculopathy of cervical region 11/27/2021   Uncontrolled pain 05/27/2021   Syncope 05/13/2021   Depression with anxiety 05/13/2021   Chronic diastolic CHF (congestive heart failure) (HCC) 05/13/2021   Chronic back pain 05/13/2021   Chest tightness 05/13/2021   Lower abdominal pain 05/13/2021   Syncope and collapse 01/01/2021   Abnormal echocardiogram 01/01/2021   BPPV (benign paroxysmal positional vertigo), left 12/26/2020   Odynophagia    Dysphagia 12/15/2020   Postural dizziness with presyncope 12/14/2020   Lumbar post-laminectomy syndrome    Chronic pain 09/28/2019   Lumbar spinal stenosis    Radicular pain    Gait disorder    Difficulty in urination 07/13/2018   Back pain 07/12/2018   Hyperlipidemia 04/16/2018   GAD (generalized anxiety disorder) 04/16/2018   AKI (acute kidney injury) (HCC)    Benign essential HTN    Major depression, recurrent (HCC)    Lumbar radiculopathy 03/09/2018   Paraparesis (HCC) 03/09/2018   Essential hypertension    Chronic nonintractable headache    Leukocytosis    Acute blood loss anemia    Postoperative pain    Generalized weakness    Spinal stenosis at L4-L5 level 03/04/2018   Spinal stenosis of lumbar region 12/28/2015   Chest pain 04/16/2015    ONSET DATE: Referral date 04-01-23  REFERRING DIAG: M54.16 (ICD-10-CM) - Lumbar radiculopathy  THERAPY DIAG:  No diagnosis found.  Rationale for  Evaluation and Treatment: Rehabilitation  SUBJECTIVE:  SUBJECTIVE STATEMENT: Pt reports she is still having pain in her Rt hand but it has been better since she has been wearing the wrist splint  Pt accompanied by: self  PERTINENT HISTORY: Frequent falls:  Spinal cord stimulator insertion on 09-28-19; stimulator removal on 05-27-21 with resultant LLE weakness;  decompression and laminectomy on 12/28/15 at L4-L5  with redo decompression surgery on May 9,2019, post-op paraparesis with surgery to remove small hematoma; 07/12/18 fall at work (involved rolling chair) with hospital admission, transfer to CIR with d/c home 08/03/18; HTN, Bil foot surgery; left ventricular hypertrophy, chronic pain syndrome, dyspnea, hypertrophic cardiomyopathy; cervical fusion March 2023;  had carpal tunnel surgery RUE in April 2024:  LVH  PAIN:  08-11-23 Are you having pain?  Pt reports her back "bothers her"- is constant in occurrence; rates pain 4-5/10 intensity; worse with bending over; sleeping on heating pad helps Intensity: 1/10;  6-7/10 pain reported in Rt thenar eminence area of Rt hand Location:  low back Description: soreness in low back Duration; varies depending on activity or movement being performed - worse with bending over or pulling leg up to chest Aggravating factors -worse with bending over or pulling leg up to chest Alleviating factor - heating pad helps some; pain medication  PRECAUTIONS: Fall  WEIGHT BEARING RESTRICTIONS: No  FALLS: Has patient fallen in last 6 months? Yes. Number of falls 3  LIVING ENVIRONMENT: Lives with: lives with their spouse Lives in: House/apartment Stairs: Yes: External: 5 steps; on right going up Has following equipment at home: Single point cane, Wheelchair (manual), and [place]  crutch  PLOF: Independent with basic ADLs, Independent with household mobility with device, Independent with community mobility with device, and Independent with transfers  PATIENT GOALS: improve strength Rt leg  OBJECTIVE:   DIAGNOSTIC FINDINGS: IMPRESSION: 1. Interval anterior discectomy and fusion from C4 through C6 with improved patency of the spinal canal and only mild residual foraminal narrowing. 2. Stable mild spinal stenosis and mild to moderate left foraminal narrowing at C3-4. 3. Stable mild spinal stenosis and mild foraminal narrowing bilaterally at C6-7. Mild foraminal narrowing bilaterally at C7-T1. 4. No cord deformity or abnormal cord signal.  No acute findings. No recent MRI of lumbar region  LOWER EXTREMITY MMT:    MMT Right Eval Left Eval  Hip flexion 3+   Hip extension    Hip abduction    Hip adduction    Hip internal rotation    Hip external rotation    Knee flexion 3-   Knee extension 3-   Ankle dorsiflexion 2-   Ankle plantarflexion    Ankle inversion    Ankle eversion    (Blank rows = not tested)   Today's Treatment: 08-06-23    GAIT: Gait pattern: decreased step length- Right, decreased stance time- Right, decreased hip/knee flexion- Right, and decreased ankle dorsiflexion- Right Distance walked: 230' (2 laps) with rollator; clinic distances during session between exercise locations Assistive device utilized: rollator  Level of assistance: SBA and CGA Comments: blue rocker AFO donned on RLE     05-07-23: Gait velocity: with [place] Walk on AFO - 1"18 secs with rollator = .42 ft/sec   With Toe off AFO 31.9 secs with rollator = 1.03 ft/sec      THEREX: RLE strengthening exercises:  RLE LAQ's with 2# weight 10 reps - seated in chair Rt Hip flexion 2# 10 reps - seated in chair  Step up exercise RLE onto 6" step placed inside // bars; pt performed  6 reps;  then needed seated rest period; changed to 4" step - pt performed 10 reps x 2  sets:  Rt foot remained on step for entire exercise during each set  Step down from 2" step with LLE for Rt quad eccentric 10 reps - with bil. UE support   Tap ups onto 4" step with LLE 10 reps for RLE closed chain strengthening with bil. UE support on // bars; min tactile cue to keep Rt knee slightly flexed to prevent hyperextension  Inside // bars with bil. UE support: Stepping forward 10 reps with LLE - inside // bars with bil. UE's Stepping laterally LLE 6 REPS, 10 REPS 1/2 MARCHING LLE 5 REPS  Leg press 30#  bil. LE's: 10 reps x 2 sets  RLE LAQ's x 10 reps after leg press exercise for stretching  --------------------------- Step ups onto 4" step RLE 10 reps; Rt foot remains on step for all reps Tap ups with LLE onto 4" step 10 reps 6 reps step down from 2"  5 reps lifting RLE onto 4" step Leg press - Bil. LE's -10 reps 30#;    35# 10 reps 2 sets;  RLE only 25# 10 reps x 2 sets LAQ's RLE 10 reps  HEP - Med[employer]  Access Code: [email or web address] URL: [email or web address] Date: 05/06/2023 Prepared by: Maebelle Munroe  Exercises - Beginner Bridge  - 1 x daily - 7 x weekly - 1 sets - 10 reps - 3 hold - Supine Heel Slide  - 1 x daily - 7 x weekly - 1 sets - 10 reps - Bent Knee Fallouts  - 1 x daily - 7 x weekly - 1 sets - 10 reps - Sidelying Hip Abduction  - 1 x daily - 7 x weekly - 1 sets - 10 reps - Seated Knee Flexion Extension AROM   - 1 x daily - 7 x weekly - 1 sets - 10 reps   PATIENT EDUCATION: Education details:  discussed STG's and progress achieved Person educated: Patient Education method: Explanation, demonstration, handouts  Education comprehension: verbalized understanding, demonstrated understanding  HOME EXERCISE PROGRAM: To be issued  GOALS: Goals reviewed with patient? Yes  SHORT TERM GOALS: Target date: 07-24-23   1.   Amb. 230' with RW with SBA without requiring seated rest period. Baseline:  Goal status: Goal met 07-21-23  2.  Perform sit to  stand from mat without UE support to demo improved LE strength. Baseline:    Goal status:  Not met - ongoing - 07-21-23  3.  Modified independent step negotiation with use of bil. Hand rails using step by step sequence. Baseline: Goal status: Goal met 07-21-23  4.   Improve TUG score from 22.25 secs to </= 19 secs with use of rollator.  Baseline:  22.25 secs with rollator & blue rocker AFO on 06-25-23;    07-21-23=  32.78, 22.62 secs with rollator Goal status: Not met - ongoing - 07-21-23  5.   Stand for 1" without UE support to demo improved standing balance. Baseline:  Goal status: Goal met 07-21-23    LONG TERM GOALS: Target date: 07-20-23  Amb. 230' with RW with SBA without requiring seated rest period. Baseline: 70' with RW Goal status: Partially met - 06-23-23  2.  Improve TUG score by at least 4 secs with use of RW for assistance with ambulation Baseline: TBA;  46 secs on 7-11;  35.97 secs on 06-16-23;  22.25 secs with rollator with blue rocker AFO  Goal status: Goal met 06-25-23  3.  Perform 5x sit to stand transfers with LUE support only to demo improved strength in RLE.  Baseline:  Goal status: Goal met 05-29-23 - RUE used   4.  Modified independent step negotiation with use of bil. Hand rails using step by step sequence. Baseline:  Goal status: 06-23-23 -- SBA to supervision needed for safety  5.  Obtain new AFO for increased RLE stability to decrease fall risk with gait. Baseline:  Goal status: In progress - delivery scheduled for 06-24-23; Goal met 06-25-23  6.  Independent in updated HEP for RLE strengthening. Baseline:  Goal status: Goal met 06-16-23   NEW LONG TERM GOALS:  Target date;  08-21-23    1.  Amb. 300' with RW with blue rocker AFO on RLE with SBA without requiring seated rest period.    Baseline;  180', 40' with rollator    Status:  NEW  2.  Improve TUG score from 22.25 secs to </= 15 secs with use of rollator.  Baseline:  22.25 secs with rollator & blue  rocker AFO on 06-25-23  Status:  NEW  3.  Pt will report ability to negotiate steps in her garage at home modified independently for safely entering & exiting home.  Baseline;  SBA to CGA  Status:  NEW  4.  Increase gait velocity to >/= 1.5 ft/sec with use of AFO & rollator for increased gait efficiency.  Baseline:  With Toe off AFO 31.9 secs with rollator = 1.03 ft/sec    Status:  NEW  5.  Independent in updated HEP for RLE strengthening.  Baseline:  Goal status: NEW  ASSESSMENT:  CLINICAL IMPRESSION: This 20th visit progress note covers dates 06-25-23 - 08-07-23.  Pt received her Blue Rocker AFO on 06-24-23.  Pt has met STG's #1, 3 and 5:  :  STG's #2 and 4 not met due to decreased gait velocity due to RLE weakness.  Pt's gait is much improved and safer with use of Blue Rocker AFO on RLE, however, pt continues to have Rt knee and ankle musc. Weakness with significantly decreased muscle endurance.  Pt continues to need rollator for assistance with ambulation with pt able to amb. Approx. 230' prior to fatigue with need for seated rest period.  RLE strength is increasing slowly.   Cont with POC.    OBJECTIVE IMPAIRMENTS: Abnormal gait, decreased balance, decreased coordination, decreased endurance, decreased strength, and impaired tone.   ACTIVITY LIMITATIONS: carrying, bending, squatting, stairs, transfers, and locomotion level  PARTICIPATION LIMITATIONS: cleaning, laundry, shopping, and community activity  PERSONAL FACTORS: Behavior pattern, Past/current experiences, and Time since onset of injury/illness/exacerbation are also affecting patient's functional outcome.   REHAB POTENTIAL: Good  CLINICAL DECISION MAKING: Evolving/moderate complexity  EVALUATION COMPLEXITY: Moderate  PLAN:  PT FREQUENCY: 2x/week  PT DURATION: 8 weeks  PLANNED INTERVENTIONS: Therapeutic exercises, Therapeutic activity, Neuromuscular re-education, Balance training, Gait training, Patient/Family  education, Self Care, Stair training, Orthotic/Fit training, DME instructions, and Aquatic Therapy  PLAN FOR NEXT SESSION:  continue RLE strengthening   Kary Kos, PT 08/11/2023, 9:36 AM

## 2023-08-12 ENCOUNTER — Other Ambulatory Visit: Payer: Self-pay

## 2023-08-12 ENCOUNTER — Encounter (INDEPENDENT_AMBULATORY_CARE_PROVIDER_SITE_OTHER): Payer: Self-pay | Admitting: Neurology

## 2023-08-12 ENCOUNTER — Other Ambulatory Visit (HOSPITAL_COMMUNITY): Payer: Self-pay

## 2023-08-12 ENCOUNTER — Ambulatory Visit (INDEPENDENT_AMBULATORY_CARE_PROVIDER_SITE_OTHER): Payer: PPO | Admitting: Neurology

## 2023-08-12 VITALS — BP 141/90 | HR 87 | Ht 65.0 in | Wt 170.0 lb

## 2023-08-12 DIAGNOSIS — M25541 Pain in joints of right hand: Secondary | ICD-10-CM | POA: Insufficient documentation

## 2023-08-12 DIAGNOSIS — G5603 Carpal tunnel syndrome, bilateral upper limbs: Secondary | ICD-10-CM

## 2023-08-12 DIAGNOSIS — Z0289 Encounter for other administrative examinations: Secondary | ICD-10-CM

## 2023-08-12 MED ORDER — METHOCARBAMOL 500 MG PO TABS
500.0000 mg | ORAL_TABLET | Freq: Three times a day (TID) | ORAL | 1 refills | Status: DC | PRN
  Filled 2023-08-12: qty 30, 10d supply, fill #0
  Filled 2023-09-30 (×2): qty 30, 10d supply, fill #1

## 2023-08-12 NOTE — Progress Notes (Signed)
Chief Complaint  Patient presents with   Procedure    Rm EMG/NCV 4.   ASSESSMENT AND PLAN  [name] is a 62 y.o. female   Right palm burning pain,  Electrodiagnostic study today showed mild bilateral carpal tunnel syndromes,  She has significant tenderness at right thumb metacarpal phalangeal joint, suggestive of musculoskeletal etiology, I have recommended warm compression, massage, as needed NSAIDs  DIAGNOSTIC DATA (LABS, IMAGING, TESTING) - I reviewed patient records, labs, notes, testing and imaging myself where available.   MEDICAL HISTORY:  [name] is a 62 year old right-handed female, referred by Dr. Teresa Coombs for electrodiagnostic study today for her complaints of right arm paresthesia.  History is obtained from the patient and review of electronic medical records. I personally reviewed pertinent available imaging films in PACS.   She had a history of cervical decompression surgery, lumbar decompression surgery with residual right foot drop, gait abnormality, wearing right ankle brace,  She underwent right carpal tunnel release surgery in spring 2024, which did help her right finger paresthesia, however postsurgically, she had a persistent right thenar area discomfort, burning sensation, could not participate well in her occupational therapy  Electrodiagnostic study today showed mild bilateral carpal tunnel syndromes, right slightly worse than left, she has significant tenderness at base of right thumb, right metacarpal jointPHYSICAL EXAM:   Vitals:   08/12/23 0728  BP: (!) 141/90  Pulse: 87  Weight: 170 lb (77.1 kg)  Height: 5\' 5"  (1.651 m)    Body mass index is 28.29 kg/m.  PHYSICAL EXAMNIATION:  Gen: NAD, conversant, well nourised, well groomed           MENTAL STATUS: Speech/cognition: Awake, alert, oriented to history taking and casual conversation CRANIAL NERVES: CN II: Visual fields are full to confrontation. Pupils are  round equal and briskly reactive to light. CN III, IV, VI: extraocular movement are normal. No ptosis. CN V: Facial sensation is intact to light touch CN VII: Face is symmetric with normal eye closure  CN VIII: Hearing is normal to causal conversation. CN IX, X: Phonation is normal. CN XI: Head turning and shoulder shrug are intact  MOTOR: Right ankle brace, no significant bilateral lower extremity proximal muscle weakness, upper extremity especially right hand examination is limited due to right palm discomfort, felt to there was no significant muscle weakness, significant tenderness at right metacarpal phalangeal joint  REFLEXES: Reflexes are 1 and symmetric at the biceps, triceps, knees, and ankles. Plantar responses are flexor.  SENSORY: Mildly decreased pinprick at bilateral fingerpads  COORDINATION: There is no trunk or limb dysmetria noted.  GAIT/STANCE: Push-up unsteady  REVIEW OF SYSTEMS:  Full 14 system review of systems performed and notable only for as above All other review of systems were negative.   ALLERGIES: Allergies  Allergen Reactions   Cephalosporins Anaphylaxis   Penicillins Anaphylaxis and Hives    Did it involve swelling of the face/tongue/throat, SOB, or low BP? Yes  Did it involve sudden or severe rash/hives, skin peeling, or any reaction on the inside of your mouth or nose? Yes  Did you need to seek medical attention at a hospital or doctor's office? Yes  When did it last happen?      10+ years  If all above answers are "NO", may proceed with cephalosporin use.  Did it involve swelling of the face/tongue/throat, SOB, or low BP? Yes Did it involve sudden or severe rash/hives, skin peeling, or any reaction on the inside of your mouth  or nose? Yes Did you need to seek medical attention at a hospital or doctor's office? Yes When did it last happen?      10+ years If all above answers are "NO", may proceed with cephalosporin use.   Anesthetics, Amide  Nausea And Vomiting    Does not know name of it. States they put it on record [facility].    Betadine [Povidone Iodine] Other (See Comments)    "Skin Burn" caused scar on Left buttock   Latex Hives, Itching, Rash and Other (See Comments)   Peach [Prunus Persica] Hives   Claritin [Loratadine]     Rash, pruritis     HOME MEDICATIONS: Current Outpatient Medications  Medication Sig Dispense Refill   acetaminophen (TYLENOL) 500 MG tablet Take 1,000 mg by mouth every 6 (six) hours as needed for headache.     ALPRAZolam (XANAX) 0.5 MG tablet Take 1/2 - 1 tablet by mouth at bedtime as needed for anxiety. 30 tablet 3   amLODipine (NORVASC) 5 MG tablet Take 1 tablet (5 mg total) by mouth daily. 90 tablet 2   atorvastatin (LIPITOR) 20 MG tablet Take 1 tablet (20 mg total) by mouth daily. 90 tablet 2   baclofen (LIORESAL) 10 MG tablet Take 1 tablet by mouth 3 (three) times daily as needed. (Patient not taking: Reported on 08/07/2023)     busPIRone (BUSPAR) 5 MG tablet Take 1 tablet by mouth 2  times daily. (Patient taking differently: Take 5 mg by mouth 2 (two) times daily as needed (anxiety).) 180 tablet 1   carvedilol (COREG) 25 MG tablet Take 1 tablet (25 mg total) by mouth 2 (two) times daily with a meal. 180 tablet 3   cholecalciferol (VITAMIN D3) 25 MCG (1000 UNIT) tablet Take 1,000 Units by mouth daily.     clotrimazole-betamethasone (LOTRISONE) cream APPLY TO THE AFFECTED AND SURROUNDING AREAS OF SKIN BY TOPICAL ROUTE 2 TIMES PER DAY IN THE MORNING AND EVENING FOR 2 WEEKS 45 g 0   diazepam (VALIUM) 5 MG tablet Take 1 tablet (5 mg total) by mouth  30 minutes prior to MRI as directed. 1 tablet 0   diphenhydrAMINE (BENADRYL) 25 MG tablet Take 25 mg by mouth daily as needed for itching.     DULoxetine (CYMBALTA) 30 MG capsule Take 1 capsule (30 mg total) by mouth daily. 90 capsule 3   Estradiol Acetate (FEMRING) 0.05 MG/24HR RING USE ONE RING VAGINALLY EVERY 3 MONTHS AS DIRECTED 1 each 4    hydrALAZINE (APRESOLINE) 50 MG tablet Take 1 tablet (50 mg total) by mouth in the morning AND 1 tablet (50 mg total) at bedtime. 90 tablet 2   meclizine (ANTIVERT) 25 MG tablet Take 25 mg by mouth 3 (three) times daily as needed for dizziness.     methocarbamol (ROBAXIN) 500 MG tablet Take 1 tablet (500 mg total) by mouth 3 (three) times daily as needed for muscle spasms. (Patient not taking: Reported on 08/07/2023) 30 tablet 1   omeprazole (PRILOSEC) 40 MG capsule Take 1 capsule (40 mg total) by mouth daily. 90 capsule 1   ondansetron (ZOFRAN) 4 MG tablet Take 1 tablet (4 mg tablet) 30 - 60 minutes each prep dose 2 tablet 0   pregabalin (LYRICA) 300 MG capsule Take 1 capsule (300 mg total) by mouth 2 (two) times daily. 60 capsule 5   spironolactone (ALDACTONE) 50 MG tablet Take 1 tablet (50 mg total) by mouth daily. 90 tablet 3   telmisartan (MICARDIS) 40 MG  tablet      traMADol (ULTRAM) 50 MG tablet Take by mouth.     No current facility-administered medications for this visit.    PAST MEDICAL HISTORY: Past Medical History:  Diagnosis Date   Anxiety    Aortic atherosclerosis (HCC) 04/16/2021   Atypical angina (HCC)    Back pain    related to spinal stenosis and disc problem, radiates down left buttocks to leg., weakness occ.   Chest pain    a. 03/2015 Cath: nl cors; b. 03/2021 Cor CTA: Ca2+ = 0. Nl Cors.   Chronic pain syndrome    Dyspnea    GERD (gastroesophageal reflux disease)    Grade I diastolic dysfunction    Headache    Hyperlipidemia    Hypertension    Lumbar post-laminectomy syndrome    LVH (left ventricular hypertrophy) 12/15/2020   a. 11/2020 Echo: EF 65-70%, no rwma, sev asymm LVH with IVSd 1.9 cm. No LVOT obs @ rest. Gr1 DD. Triv MR.   PONV (postoperative nausea and vomiting)    Pulmonary nodules    a. 03/2021 CT Chest: 3mm pulm nodules in bilat lower lobes. F/u 1 yr.   Right foot drop    Syncope    a. 11/2020 Zio: No significant arrhythmias.   Vaginal foreign object     "Uses Femring"    PAST SURGICAL HISTORY: Past Surgical History:  Procedure Laterality Date   ABDOMINAL HYSTERECTOMY     ANTERIOR CERVICAL DECOMP/DISCECTOMY FUSION N/A 01/03/2022   Procedure: Anterior Cervical Decompression Fusion - Cervical four-Cervical five - Cervical five-Cervical six;  Surgeon: Tia Alert, MD;  Location: [facility] OR;  Service: Neurosurgery;  Laterality: N/A;   BIOPSY  12/16/2020   Procedure: BIOPSY;  Surgeon: Meryl Dare, MD;  Location: [facility] ENDOSCOPY;  Service: Endoscopy;;   CARDIAC CATHETERIZATION N/A 04/18/2015   Procedure: Left Heart Cath and Coronary Angiography;  Surgeon: Rinaldo Cloud, MD;  Location: [facility] INVASIVE CV LAB;  Service: Cardiovascular;  Laterality: N/A;   COLONOSCOPY     COLONOSCOPY W/ BIOPSIES AND POLYPECTOMY  2018   ESOPHAGOGASTRODUODENOSCOPY (EGD) WITH PROPOFOL N/A 12/16/2020   Procedure: ESOPHAGOGASTRODUODENOSCOPY (EGD) WITH PROPOFOL;  Surgeon: Meryl Dare, MD;  Location: [facility] ENDOSCOPY;  Service: Endoscopy;  Laterality: N/A;   FOOT SURGERY Bilateral    [facility] "bunion,bone spur, tendon" (1) -6'16, (1)-10'16   HEMATOMA EVACUATION N/A 01/05/2022   Procedure: Cervical Wound Exploration;  Surgeon: Coletta Memos, MD;  Location: [facility] OR;  Service: Neurosurgery;  Laterality: N/A;   IR EPIDUROGRAPHY  07/21/2018   LUMBAR LAMINECTOMY/DECOMPRESSION MICRODISCECTOMY Bilateral 12/28/2015   Procedure: MICRO LUMBAR DECOMPRESSION L4 - L5 BILATERALLY;  Surgeon: Jene Every, MD;  Location: [facility] ORS;  Service: Orthopedics;  Laterality: Bilateral;   LUMBAR LAMINECTOMY/DECOMPRESSION MICRODISCECTOMY Bilateral 03/04/2018   Procedure: Revision of Microlumbar Decompression Bilateral Lumbar Four-Five;  Surgeon: Jene Every, MD;  Location: [facility] OR;  Service: Orthopedics;  Laterality: Bilateral;  90 mins   SAVORY DILATION N/A 12/16/2020   Procedure: SAVORY DILATION;  Surgeon: Meryl Dare, MD;  Location: [facility] ENDOSCOPY;  Service: Endoscopy;  Laterality: N/A;    SPINAL CORD STIMULATOR INSERTION N/A 09/28/2019   Procedure: THORACIC SPINAL CORD STIMULATOR INSERTION;  Surgeon: Venita Lick, MD;  Location: [facility] OR;  Service: Orthopedics;  Laterality: N/A;  2.5 hrs   SPINAL CORD STIMULATOR REMOVAL N/A 05/27/2021   Procedure: LUMBAR SPINAL CORD STIMULATOR REMOVAL;  Surgeon: Lucy Chris, MD;  Location: [facility] ORS;  Service: Neurosurgery;  Laterality: N/A;   TUBAL LIGATION  WISDOM TOOTH EXTRACTION     WOUND EXPLORATION N/A 03/04/2018   Procedure: EXPLORATION OF LUMBAR DECOMPRESSION WOUND;  Surgeon: Jene Every, MD;  Location: [facility] OR;  Service: Orthopedics;  Laterality: N/A;    FAMILY HISTORY: Family History  Problem Relation Age of Onset   Heart attack Mother    Lung cancer Father    Cancer Father    Pancreatic cancer Sister    Breast cancer Sister 67   Multiple myeloma Sister    Breast cancer Sister        diagnosed in her 60's   Heart attack Sister    Throat cancer Brother    Lung cancer Brother    Stomach cancer Cousin    Colon cancer Neg Hx    Colon polyps Neg Hx    Esophageal cancer Neg Hx    Rectal cancer Neg Hx     SOCIAL HISTORY: Social History   Socioeconomic History   Marital status: Married    Spouse name: Soil scientist   Number of children: 1   Years of education: Not on file   Highest education level: Not on file  Occupational History   Not on file  Tobacco Use   Smoking status: Never   Smokeless tobacco: Never  Vaping Use   Vaping status: Never Used  Substance and Sexual Activity   Alcohol use: Never   Drug use: Never   Sexual activity: Yes    Partners: Male    Birth control/protection: Post-menopausal, Surgical  Other Topics Concern   Not on file  Social History Narrative   Not on file   Social Determinants of Health   Financial Resource Strain: Low Risk  (09/29/2022)   Overall Financial Resource Strain (CARDIA)    Difficulty of Paying Living Expenses: Not hard at all  Food Insecurity: No Food  Insecurity (09/29/2022)   Hunger Vital Sign    Worried About Running Out of Food in the Last Year: Never true    Ran Out of Food in the Last Year: Never true  Transportation Needs: No Transportation Needs (09/29/2022)   PRAPARE - Administrator, Civil Service (Medical): No    Lack of Transportation (Non-Medical): No  Physical Activity: Inactive (09/29/2022)   Exercise Vital Sign    Days of Exercise per Week: 0 days    Minutes of Exercise per Session: 0 min  Stress: No Stress Concern Present (09/29/2022)   [employer] of Occupational Health - Occupational Stress Questionnaire    Feeling of Stress : Not at all  Social Connections: Socially Integrated (09/29/2022)   Social Connection and Isolation Panel [NHANES]    Frequency of Communication with Friends and Family: More than three times a week    Frequency of Social Gatherings with Friends and Family: More than three times a week    Attends Religious Services: More than 4 times per year    Active Member of [employer] or Organizations: Yes    Attends Banker Meetings: More than 4 times per year    Marital Status: Married  Catering manager Violence: Not At Risk (09/29/2022)   Humiliation, Afraid, Rape, and Kick questionnaire    Fear of Current or Ex-Partner: No    Emotionally Abused: No    Physically Abused: No    Sexually Abused: No      Levert Feinstein, M.D. Ph.D.  [facility] [address], [address] [place], [place] [address] Ph: [phone] Fax: [phone]  CC:  [place], [name],  MD [address] [place],  [place] [address]  [place], [name], MD

## 2023-08-12 NOTE — Procedures (Signed)
Full Name: [name] Gender: Female MRN #: [id number] Date of Birth: 11-21-1960    Visit Date: 08/12/2023 07:27 Age: 62 Years Examining Physician: Dr. Levert Feinstein Referring Physician: Dr. Windell Norfolk Height: 5 feet 5 inch History: 62 year old right-handed female, history of cervical decompression surgery, right carpal tunnel release surgery, complains of persistent right palm burning discomfort  Summary of the test: Nerve conduction study:  Bilateral radial sensory responses were robust.  Bilateral ulnar sensory responses showed normal snap amplitude, left side has mildly prolonged peak latency, that can be related to her cold limb temperature.  Bilateral median sensory responses showed moderately prolonged peak latency, left side has normal snap amplitude, right side has mildly decreased snap amplitude.  Bilateral median, ulnar motor responses were normal.  Right median motor response showed borderline prolonged distal latency.  Electromyography:  Selected needle examinations of right upper extremity muscles were normal.  She was very sensitive to needle prick pain, could not tolerate cervical paraspinal muscle examination.  Conclusion: This is a mild abnormal study.  There is evidence of mild bilateral median neuropathy across the wrist consistent with mild carpal tunnel syndromes, right slightly worse than left.    ------------------------------- Levert Feinstein M.D. PhD  [facility] [address] [place], [place] [address] Tel: [phone] Fax: [phone]  Verbal informed consent was obtained from the patient, patient was informed of potential risk of procedure, including bruising, bleeding, hematoma formation, infection, muscle weakness, muscle pain, numbness, among others.        MNC    Nerve / Sites Muscle Latency Ref. Amplitude Ref. Rel Amp Segments Distance Velocity Ref. Area    ms ms mV mV %  cm m/s m/s mVms  R Median  - APB     Wrist APB 4.4 <=4.4 9.5 >=4.0 100 Wrist - APB 7   31.5     Upper arm APB 8.5  8.4  87.7 Upper arm - Wrist 23 56 >=49 29.9  L Median - APB     Wrist APB 4.2 <=4.4 9.8 >=4.0 100 Wrist - APB 7   34.1     Upper arm APB 9.0  9.4  95.4 Upper arm - Wrist 25.6 53 >=49 32.2  R Ulnar - ADM     Wrist ADM 2.8 <=3.3 11.9 >=6.0 100 Wrist - ADM 7   44.6     B.Elbow ADM 4.9  9.1  76 B.Elbow - Wrist 13 64 >=49 35.4     A.Elbow ADM 8.1  9.7  108 A.Elbow - B.Elbow 16.6 52 >=49 41.0  L Ulnar - ADM     Wrist ADM 2.9 <=3.3 8.3 >=6.0 100 Wrist - ADM 7   31.6     B.Elbow ADM 4.9  7.9  94.4 B.Elbow - Wrist 12 60 >=49 30.7     A.Elbow ADM 8.1  7.3  93.3 A.Elbow - B.Elbow 16 51 >=49 29.2             SNC    Nerve / Sites Rec. Site Peak Lat Ref.  Amp Ref. Segments Distance Peak Diff Ref.    ms ms V V  cm ms ms  R Radial - Anatomical snuff box (Forearm)     Forearm Wrist 2.5 <=2.9 30 >=15 Forearm - Wrist 10    L Radial - Anatomical snuff box (Forearm)     Forearm Wrist 2.8 <=2.9 19 >=15 Forearm - Wrist 10    R Median, Ulnar -  Transcarpal comparison     Median Palm Wrist 3.0 <=2.2 31 >=35 Median Palm - Wrist 8       Ulnar Palm Wrist 2.3 <=2.2 6 >=12 Ulnar Palm - Wrist 8          Median Palm - Ulnar Palm  0.7 <=0.4  L Median, Ulnar - Transcarpal comparison     Median Palm Wrist 2.8 <=2.2 37 >=35 Median Palm - Wrist 8       Ulnar Palm Wrist 2.1 <=2.2 7 >=12 Ulnar Palm - Wrist 8          Median Palm - Ulnar Palm  0.7 <=0.4  R Median - Orthodromic (Dig II, Mid palm)     Dig II Wrist 4.2 <=3.4 7 >=10 Dig II - Wrist 13    L Median - Orthodromic (Dig II, Mid palm)     Dig II Wrist 4.1 <=3.4 11 >=10 Dig II - Wrist 13    R Ulnar - Orthodromic, (Dig V, Mid palm)     Dig V Wrist 3.1 <=3.1 7 >=5 Dig V - Wrist 11    L Ulnar - Orthodromic, (Dig V, Mid palm)     Dig V Wrist 3.8 <=3.1 8 >=5 Dig V - Wrist 66                       F  Wave    Nerve F Lat Ref.   ms ms  R Ulnar - ADM 28.8 <=32.0  L Ulnar - ADM  29.2 <=32.0         EMG Summary Table    Spontaneous MUAP Recruitment  Muscle IA Fib PSW Fasc Other Amp Dur. Poly Pattern  R. Abductor pollicis brevis Normal None None None _______ Normal Normal Normal Normal  R. First dorsal interosseous Normal None None None _______ Normal Normal Normal Normal  R. Pronator teres Normal None None None _______ Normal Normal Normal Normal  R. Biceps brachii Normal None None None _______ Normal Normal Normal Normal  R. Deltoid Normal None None None _______ Normal Normal Normal Normal  R. Triceps brachii Normal None None None _______ Normal Normal Normal Normal

## 2023-08-13 ENCOUNTER — Ambulatory Visit: Payer: PPO | Admitting: Physical Therapy

## 2023-08-13 ENCOUNTER — Other Ambulatory Visit (HOSPITAL_COMMUNITY): Payer: Self-pay

## 2023-08-13 DIAGNOSIS — M6281 Muscle weakness (generalized): Secondary | ICD-10-CM

## 2023-08-13 DIAGNOSIS — R2689 Other abnormalities of gait and mobility: Secondary | ICD-10-CM

## 2023-08-13 MED ORDER — AMITRIPTYLINE HCL 25 MG PO TABS
25.0000 mg | ORAL_TABLET | Freq: Every day | ORAL | 2 refills | Status: AC
  Filled 2023-08-13: qty 90, 90d supply, fill #0
  Filled 2024-05-09 (×2): qty 90, 90d supply, fill #1

## 2023-08-13 NOTE — Therapy (Signed)
OUTPATIENT PHYSICAL THERAPY NEURO TREATMENT NOTE       Patient Name: [name] MRN: [id number] DOB:Apr 20, 1961, 62 y.o., female Today's Date: 08/14/2023   PCP: [place], [name]., MD REFERRING PROVIDER: Ranelle Oyster, MD  END OF SESSION:  PT End of Session - 08/14/23 1437     Visit Number 22    Number of Visits 27    Date for PT Re-Evaluation 08/27/23    Authorization Type [employer]    Authorization Time Period 04-21-23 - 06-27-23;  06-25-23 - 08-27-23    Progress Note Due on Visit 10    PT Start Time 0931    PT Stop Time 1016    PT Time Calculation (min) 45 min    Equipment Utilized During Treatment Gait belt    Activity Tolerance Patient tolerated treatment well   limited by RLE weakness   Behavior During Therapy [facility] for tasks assessed/performed                                Past Medical History:  Diagnosis Date   Anxiety    Aortic atherosclerosis (HCC) 04/16/2021   Atypical angina (HCC)    Back pain    related to spinal stenosis and disc problem, radiates down left buttocks to leg., weakness occ.   Chest pain    a. 03/2015 Cath: nl cors; b. 03/2021 Cor CTA: Ca2+ = 0. Nl Cors.   Chronic pain syndrome    Dyspnea    GERD (gastroesophageal reflux disease)    Grade I diastolic dysfunction    Headache    Hyperlipidemia    Hypertension    Lumbar post-laminectomy syndrome    LVH (left ventricular hypertrophy) 12/15/2020   a. 11/2020 Echo: EF 65-70%, no rwma, sev asymm LVH with IVSd 1.9 cm. No LVOT obs @ rest. Gr1 DD. Triv MR.   PONV (postoperative nausea and vomiting)    Pulmonary nodules    a. 03/2021 CT Chest: 3mm pulm nodules in bilat lower lobes. F/u 1 yr.   Right foot drop    Syncope    a. 11/2020 Zio: No significant arrhythmias.   Vaginal foreign object    "Uses Femring"   Past Surgical History:  Procedure Laterality Date   ABDOMINAL HYSTERECTOMY     ANTERIOR CERVICAL DECOMP/DISCECTOMY FUSION N/A 01/03/2022    Procedure: Anterior Cervical Decompression Fusion - Cervical four-Cervical five - Cervical five-Cervical six;  Surgeon: Tia Alert, MD;  Location: [facility] OR;  Service: Neurosurgery;  Laterality: N/A;   BIOPSY  12/16/2020   Procedure: BIOPSY;  Surgeon: Meryl Dare, MD;  Location: [facility] ENDOSCOPY;  Service: Endoscopy;;   CARDIAC CATHETERIZATION N/A 04/18/2015   Procedure: Left Heart Cath and Coronary Angiography;  Surgeon: Rinaldo Cloud, MD;  Location: [facility] INVASIVE CV LAB;  Service: Cardiovascular;  Laterality: N/A;   COLONOSCOPY     COLONOSCOPY W/ BIOPSIES AND POLYPECTOMY  2018   ESOPHAGOGASTRODUODENOSCOPY (EGD) WITH PROPOFOL N/A 12/16/2020   Procedure: ESOPHAGOGASTRODUODENOSCOPY (EGD) WITH PROPOFOL;  Surgeon: Meryl Dare, MD;  Location: [facility] ENDOSCOPY;  Service: Endoscopy;  Laterality: N/A;   FOOT SURGERY Bilateral    [facility] "bunion,bone spur, tendon" (1) -6'16, (1)-10'16   HEMATOMA EVACUATION N/A 01/05/2022   Procedure: Cervical Wound Exploration;  Surgeon: Coletta Memos, MD;  Location: [facility] OR;  Service: Neurosurgery;  Laterality: N/A;   IR EPIDUROGRAPHY  07/21/2018   LUMBAR LAMINECTOMY/DECOMPRESSION MICRODISCECTOMY Bilateral 12/28/2015   Procedure: MICRO  LUMBAR DECOMPRESSION L4 - L5 BILATERALLY;  Surgeon: Jene Every, MD;  Location: [facility] ORS;  Service: Orthopedics;  Laterality: Bilateral;   LUMBAR LAMINECTOMY/DECOMPRESSION MICRODISCECTOMY Bilateral 03/04/2018   Procedure: Revision of Microlumbar Decompression Bilateral Lumbar Four-Five;  Surgeon: Jene Every, MD;  Location: [facility] OR;  Service: Orthopedics;  Laterality: Bilateral;  90 mins   SAVORY DILATION N/A 12/16/2020   Procedure: SAVORY DILATION;  Surgeon: Meryl Dare, MD;  Location: [facility] ENDOSCOPY;  Service: Endoscopy;  Laterality: N/A;   SPINAL CORD STIMULATOR INSERTION N/A 09/28/2019   Procedure: THORACIC SPINAL CORD STIMULATOR INSERTION;  Surgeon: Venita Lick, MD;  Location: [facility] OR;  Service: Orthopedics;   Laterality: N/A;  2.5 hrs   SPINAL CORD STIMULATOR REMOVAL N/A 05/27/2021   Procedure: LUMBAR SPINAL CORD STIMULATOR REMOVAL;  Surgeon: Lucy Chris, MD;  Location: [facility] ORS;  Service: Neurosurgery;  Laterality: N/A;   TUBAL LIGATION     WISDOM TOOTH EXTRACTION     WOUND EXPLORATION N/A 03/04/2018   Procedure: EXPLORATION OF LUMBAR DECOMPRESSION WOUND;  Surgeon: Jene Every, MD;  Location: [facility] OR;  Service: Orthopedics;  Laterality: N/A;   Patient Active Problem List   Diagnosis Date Noted   Pain in thumb joint with movement of right hand 08/12/2023   Localized primary osteoarthritis of carpometacarpal joint of right thumb 07/29/2023   Right foot drop 07/29/2023   Frequent falls 04/14/2023   Gastroesophageal reflux disease 04/14/2023   Right upper extremity numbness 09/29/2022   Stage 3a chronic kidney disease (HCC) 09/29/2022   Cervical spondylosis with radiculopathy 01/05/2022   S/P cervical spinal fusion 01/03/2022   Cervical disc disorder with radiculopathy of cervical region 11/27/2021   Uncontrolled pain 05/27/2021   Syncope 05/13/2021   Depression with anxiety 05/13/2021   Chronic diastolic CHF (congestive heart failure) (HCC) 05/13/2021   Chronic back pain 05/13/2021   Chest tightness 05/13/2021   Lower abdominal pain 05/13/2021   Syncope and collapse 01/01/2021   Abnormal echocardiogram 01/01/2021   BPPV (benign paroxysmal positional vertigo), left 12/26/2020   Odynophagia    Dysphagia 12/15/2020   Postural dizziness with presyncope 12/14/2020   Lumbar post-laminectomy syndrome    Chronic pain 09/28/2019   Lumbar spinal stenosis    Radicular pain    Gait disorder    Difficulty in urination 07/13/2018   Back pain 07/12/2018   Hyperlipidemia 04/16/2018   GAD (generalized anxiety disorder) 04/16/2018   AKI (acute kidney injury) (HCC)    Benign essential HTN    Major depression, recurrent (HCC)    Lumbar radiculopathy 03/09/2018   Paraparesis (HCC) 03/09/2018    Essential hypertension    Chronic nonintractable headache    Leukocytosis    Acute blood loss anemia    Postoperative pain    Generalized weakness    Spinal stenosis at L4-L5 level 03/04/2018   Spinal stenosis of lumbar region 12/28/2015   Chest pain 04/16/2015    ONSET DATE: Referral date 04-01-23  REFERRING DIAG: M54.16 (ICD-10-CM) - Lumbar radiculopathy  THERAPY DIAG:  Other abnormalities of gait and mobility  Muscle weakness (generalized)  Rationale for Evaluation and Treatment: Rehabilitation  SUBJECTIVE:  SUBJECTIVE STATEMENT: Pt reports she had the NCV and EMG tests yesterday (Wed.) - was told she had mild CTS in both hands; says she has used some heat on her hand as instructed and the burning sensation is not quite as bad today as it was on Tuesday Pt accompanied by: self  PERTINENT HISTORY: Frequent falls:  Spinal cord stimulator insertion on 09-28-19; stimulator removal on 05-27-21 with resultant LLE weakness;  decompression and laminectomy on 12/28/15 at L4-L5  with redo decompression surgery on May 9,2019, post-op paraparesis with surgery to remove small hematoma; 07/12/18 fall at work (involved rolling chair) with hospital admission, transfer to CIR with d/c home 08/03/18; HTN, Bil foot surgery; left ventricular hypertrophy, chronic pain syndrome, dyspnea, hypertrophic cardiomyopathy; cervical fusion March 2023;  had carpal tunnel surgery RUE in April 2024:  LVH  PAIN:  08-13-23 Are you having pain?  Pt reports her back "bothers her"- is constant in occurrence; rates pain 4-5/10 intensity; worse with bending over; sleeping on heating pad helps Intensity: 1/10;  4/10 pain reported in Rt thenar eminence area of Rt hand Location:  low back Description: soreness in low back Duration; varies  depending on activity or movement being performed - worse with bending over or pulling leg up to chest Aggravating factors -worse with bending over or pulling leg up to chest Alleviating factor - heating pad helps some; pain medication  PRECAUTIONS: Fall  WEIGHT BEARING RESTRICTIONS: No  FALLS: Has patient fallen in last 6 months? Yes. Number of falls 3  LIVING ENVIRONMENT: Lives with: lives with their spouse Lives in: House/apartment Stairs: Yes: External: 5 steps; on right going up Has following equipment at home: Single point cane, Wheelchair (manual), and [place]strand crutch  PLOF: Independent with basic ADLs, Independent with household mobility with device, Independent with community mobility with device, and Independent with transfers  PATIENT GOALS: improve strength Rt leg  OBJECTIVE:   DIAGNOSTIC FINDINGS: IMPRESSION: 1. Interval anterior discectomy and fusion from C4 through C6 with improved patency of the spinal canal and only mild residual foraminal narrowing. 2. Stable mild spinal stenosis and mild to moderate left foraminal narrowing at C3-4. 3. Stable mild spinal stenosis and mild foraminal narrowing bilaterally at C6-7. Mild foraminal narrowing bilaterally at C7-T1. 4. No cord deformity or abnormal cord signal.  No acute findings. No recent MRI of lumbar region  LOWER EXTREMITY MMT:    MMT Right Eval Left Eval  Hip flexion 3+   Hip extension    Hip abduction    Hip adduction    Hip internal rotation    Hip external rotation    Knee flexion 3-   Knee extension 3-   Ankle dorsiflexion 2-   Ankle plantarflexion    Ankle inversion    Ankle eversion    (Blank rows = not tested)   Today's Treatment: 08-13-23    GAIT: Gait pattern: decreased step length- Right, decreased stance time- Right, decreased hip/knee flexion- Right, and decreased ankle dorsiflexion- Right Distance walked: 15' (3 laps) with rollator; clinic distances during session between  exercise locations Assistive device utilized: rollator  Level of assistance: SBA and CGA Comments: blue rocker AFO donned on RLE  STEP TRAINING:  pt negotiated 4 steps with use of Lt hand rail with ascension, using step by step sequence; Rt hand rail used with descension, step by step sequence; pt performed 2 reps with SBA- cues initially for correct sequence    05-07-23: Gait velocity: with [place] Walk on AFO - 1"18  secs with rollator = .42 ft/sec   With Toe off AFO 31.9 secs with rollator = 1.03 ft/sec      THEREX: RLE strengthening exercises:  Step up exercise RLE onto 4" step placed inside // bars, 10 reps x 2 sets:  Rt foot remained on step for entire exercise during each set  Step down from 2" step with LLE for Rt quad eccentric 6 reps (stopped after 6 reps due to increased pain in Rt hand) - with bil. UE support   Tap ups onto 4" step with LLE 10 reps for RLE closed chain strengthening with bil. UE support on // bars; min tactile cue to keep Rt knee slightly flexed to prevent hyperextension  Step down exercise from 2" step 10 reps: then 5 reps on 2nd set - for Rt quad eccentric strengthening  Leg press 35#  bil. LE's, 10 reps x 3 sets  RLE LAQ's x 10 reps after leg press exercise for stretching ----------------------------       HEP - Medbridge  Access Code: [email or web address] URL: [email or web address] Date: 05/06/2023 Prepared by: Maebelle Munroe  Exercises - Beginner Bridge  - 1 x daily - 7 x weekly - 1 sets - 10 reps - 3 hold - Supine Heel Slide  - 1 x daily - 7 x weekly - 1 sets - 10 reps - Bent Knee Fallouts  - 1 x daily - 7 x weekly - 1 sets - 10 reps - Sidelying Hip Abduction  - 1 x daily - 7 x weekly - 1 sets - 10 reps - Seated Knee Flexion Extension AROM   - 1 x daily - 7 x weekly - 1 sets - 10 reps   PATIENT EDUCATION: Education details:  discussed STG's and progress achieved Person educated: Patient Education method: Explanation,  demonstration, handouts  Education comprehension: verbalized understanding, demonstrated understanding  HOME EXERCISE PROGRAM: To be issued  GOALS: Goals reviewed with patient? Yes  SHORT TERM GOALS: Target date: 07-24-23   1.   Amb. 230' with RW with SBA without requiring seated rest period. Baseline:  Goal status: Goal met 07-21-23  2.  Perform sit to stand from mat without UE support to demo improved LE strength. Baseline:    Goal status:  Not met - ongoing - 07-21-23  3.  Modified independent step negotiation with use of bil. Hand rails using step by step sequence. Baseline: Goal status: Goal met 07-21-23  4.   Improve TUG score from 22.25 secs to </= 19 secs with use of rollator.  Baseline:  22.25 secs with rollator & blue rocker AFO on 06-25-23;    07-21-23=  32.78, 22.62 secs with rollator Goal status: Not met - ongoing - 07-21-23  5.   Stand for 1" without UE support to demo improved standing balance. Baseline:  Goal status: Goal met 07-21-23    LONG TERM GOALS: Target date: 07-20-23  Amb. 230' with RW with SBA without requiring seated rest period. Baseline: 48' with RW Goal status: Partially met - 06-23-23  2.  Improve TUG score by at least 4 secs with use of RW for assistance with ambulation Baseline: TBA;  46 secs on 7-11;  35.97 secs on 06-16-23;  22.25 secs with rollator with blue rocker AFO Goal status: Goal met 06-25-23  3.  Perform 5x sit to stand transfers with LUE support only to demo improved strength in RLE.  Baseline:  Goal status: Goal met 05-29-23 - RUE used   4.  Modified independent  step negotiation with use of bil. Hand rails using step by step sequence. Baseline:  Goal status: 06-23-23 -- SBA to supervision needed for safety  5.  Obtain new AFO for increased RLE stability to decrease fall risk with gait. Baseline:  Goal status: In progress - delivery scheduled for 06-24-23; Goal met 06-25-23  6.  Independent in updated HEP for RLE  strengthening. Baseline:  Goal status: Goal met 06-16-23   NEW LONG TERM GOALS:  Target date;  08-21-23    1.  Amb. 300' with RW with blue rocker AFO on RLE with SBA without requiring seated rest period.    Baseline;  180', 40' with rollator    Status:  NEW  2.  Improve TUG score from 22.25 secs to </= 15 secs with use of rollator.  Baseline:  22.25 secs with rollator & blue rocker AFO on 06-25-23  Status:  NEW  3.  Pt will report ability to negotiate steps in her garage at home modified independently for safely entering & exiting home.  Baseline;  SBA to CGA  Status:  NEW  4.  Increase gait velocity to >/= 1.5 ft/sec with use of AFO & rollator for increased gait efficiency.  Baseline:  With Toe off AFO 31.9 secs with rollator = 1.03 ft/sec    Status:  NEW  5.  Independent in updated HEP for RLE strengthening.  Baseline:  Goal status: NEW  ASSESSMENT:  CLINICAL IMPRESSION: PT session focused on RLE strengthening in closed chain position.  Pt amb. Further distance today with use of RW (345'/3 laps consecutively) compared to 2 laps/230' which has been ambulation distance in previous sessions.  C/o Rt hand pain continues to limit pt's tolerance for weight bearing which subsequently impacts standing tolerance for closed chain exercise for RLE strengthening.  Cont with POC.    OBJECTIVE IMPAIRMENTS: Abnormal gait, decreased balance, decreased coordination, decreased endurance, decreased strength, and impaired tone.   ACTIVITY LIMITATIONS: carrying, bending, squatting, stairs, transfers, and locomotion level  PARTICIPATION LIMITATIONS: cleaning, laundry, shopping, and community activity  PERSONAL FACTORS: Behavior pattern, Past/current experiences, and Time since onset of injury/illness/exacerbation are also affecting patient's functional outcome.   REHAB POTENTIAL: Good  CLINICAL DECISION MAKING: Evolving/moderate complexity  EVALUATION COMPLEXITY: Moderate  PLAN:  PT  FREQUENCY: 2x/week  PT DURATION: 8 weeks  PLANNED INTERVENTIONS: Therapeutic exercises, Therapeutic activity, Neuromuscular re-education, Balance training, Gait training, Patient/Family education, Self Care, Stair training, Orthotic/Fit training, DME instructions, and Aquatic Therapy  PLAN FOR NEXT SESSION:  continue RLE strengthening   Kary Kos, PT 08/14/2023, 2:40 PM

## 2023-08-14 ENCOUNTER — Other Ambulatory Visit (HOSPITAL_COMMUNITY): Payer: Self-pay

## 2023-08-14 ENCOUNTER — Encounter: Payer: Self-pay | Admitting: Physical Therapy

## 2023-08-20 ENCOUNTER — Encounter: Payer: Self-pay | Admitting: Physical Therapy

## 2023-08-20 ENCOUNTER — Ambulatory Visit: Payer: PPO | Admitting: Physical Therapy

## 2023-08-20 DIAGNOSIS — R2689 Other abnormalities of gait and mobility: Secondary | ICD-10-CM | POA: Diagnosis not present

## 2023-08-20 DIAGNOSIS — R2681 Unsteadiness on feet: Secondary | ICD-10-CM

## 2023-08-20 DIAGNOSIS — M6281 Muscle weakness (generalized): Secondary | ICD-10-CM

## 2023-08-20 NOTE — Therapy (Signed)
OUTPATIENT PHYSICAL THERAPY NEURO TREATMENT NOTE       Patient Name: [name] MRN: [id number] DOB:October 28, 1960, 62 y.o., female Today's Date: 08/20/2023   PCP: [place], [name]., MD REFERRING PROVIDER: Ranelle Oyster, MD  END OF SESSION:  PT End of Session - 08/20/23 0926     Visit Number 23    Number of Visits 27    Date for PT Re-Evaluation 08/27/23    Authorization Type [employer]    Authorization Time Period 04-21-23 - 06-27-23;  06-25-23 - 08-27-23    Progress Note Due on Visit 10    PT Start Time 0928    Equipment Utilized During Treatment Gait belt    Activity Tolerance Patient tolerated treatment well   limited by RLE weakness   Behavior During Therapy [facility] for tasks assessed/performed                                Past Medical History:  Diagnosis Date   Anxiety    Aortic atherosclerosis (HCC) 04/16/2021   Atypical angina (HCC)    Back pain    related to spinal stenosis and disc problem, radiates down left buttocks to leg., weakness occ.   Chest pain    a. 03/2015 Cath: nl cors; b. 03/2021 Cor CTA: Ca2+ = 0. Nl Cors.   Chronic pain syndrome    Dyspnea    GERD (gastroesophageal reflux disease)    Grade I diastolic dysfunction    Headache    Hyperlipidemia    Hypertension    Lumbar post-laminectomy syndrome    LVH (left ventricular hypertrophy) 12/15/2020   a. 11/2020 Echo: EF 65-70%, no rwma, sev asymm LVH with IVSd 1.9 cm. No LVOT obs @ rest. Gr1 DD. Triv MR.   PONV (postoperative nausea and vomiting)    Pulmonary nodules    a. 03/2021 CT Chest: 3mm pulm nodules in bilat lower lobes. F/u 1 yr.   Right foot drop    Syncope    a. 11/2020 Zio: No significant arrhythmias.   Vaginal foreign object    "Uses Femring"   Past Surgical History:  Procedure Laterality Date   ABDOMINAL HYSTERECTOMY     ANTERIOR CERVICAL DECOMP/DISCECTOMY FUSION N/A 01/03/2022   Procedure: Anterior Cervical Decompression Fusion - Cervical  four-Cervical five - Cervical five-Cervical six;  Surgeon: Tia Alert, MD;  Location: [facility] OR;  Service: Neurosurgery;  Laterality: N/A;   BIOPSY  12/16/2020   Procedure: BIOPSY;  Surgeon: Meryl Dare, MD;  Location: [facility] ENDOSCOPY;  Service: Endoscopy;;   CARDIAC CATHETERIZATION N/A 04/18/2015   Procedure: Left Heart Cath and Coronary Angiography;  Surgeon: Rinaldo Cloud, MD;  Location: [facility] INVASIVE CV LAB;  Service: Cardiovascular;  Laterality: N/A;   COLONOSCOPY     COLONOSCOPY W/ BIOPSIES AND POLYPECTOMY  2018   ESOPHAGOGASTRODUODENOSCOPY (EGD) WITH PROPOFOL N/A 12/16/2020   Procedure: ESOPHAGOGASTRODUODENOSCOPY (EGD) WITH PROPOFOL;  Surgeon: Meryl Dare, MD;  Location: [facility] ENDOSCOPY;  Service: Endoscopy;  Laterality: N/A;   FOOT SURGERY Bilateral    [facility] "bunion,bone spur, tendon" (1) -6'16, (1)-10'16   HEMATOMA EVACUATION N/A 01/05/2022   Procedure: Cervical Wound Exploration;  Surgeon: Coletta Memos, MD;  Location: [facility] OR;  Service: Neurosurgery;  Laterality: N/A;   IR EPIDUROGRAPHY  07/21/2018   LUMBAR LAMINECTOMY/DECOMPRESSION MICRODISCECTOMY Bilateral 12/28/2015   Procedure: MICRO LUMBAR DECOMPRESSION L4 - L5 BILATERALLY;  Surgeon: Jene Every, MD;  Location: [facility] ORS;  Service: Orthopedics;  Laterality: Bilateral;   LUMBAR LAMINECTOMY/DECOMPRESSION MICRODISCECTOMY Bilateral 03/04/2018   Procedure: Revision of Microlumbar Decompression Bilateral Lumbar Four-Five;  Surgeon: Jene Every, MD;  Location: [facility] OR;  Service: Orthopedics;  Laterality: Bilateral;  90 mins   SAVORY DILATION N/A 12/16/2020   Procedure: SAVORY DILATION;  Surgeon: Meryl Dare, MD;  Location: [facility] ENDOSCOPY;  Service: Endoscopy;  Laterality: N/A;   SPINAL CORD STIMULATOR INSERTION N/A 09/28/2019   Procedure: THORACIC SPINAL CORD STIMULATOR INSERTION;  Surgeon: Venita Lick, MD;  Location: [facility] OR;  Service: Orthopedics;  Laterality: N/A;  2.5 hrs   SPINAL CORD STIMULATOR REMOVAL N/A  05/27/2021   Procedure: LUMBAR SPINAL CORD STIMULATOR REMOVAL;  Surgeon: Lucy Chris, MD;  Location: [facility] ORS;  Service: Neurosurgery;  Laterality: N/A;   TUBAL LIGATION     WISDOM TOOTH EXTRACTION     WOUND EXPLORATION N/A 03/04/2018   Procedure: EXPLORATION OF LUMBAR DECOMPRESSION WOUND;  Surgeon: Jene Every, MD;  Location: [facility] OR;  Service: Orthopedics;  Laterality: N/A;   Patient Active Problem List   Diagnosis Date Noted   Pain in thumb joint with movement of right hand 08/12/2023   Localized primary osteoarthritis of carpometacarpal joint of right thumb 07/29/2023   Right foot drop 07/29/2023   Frequent falls 04/14/2023   Gastroesophageal reflux disease 04/14/2023   Right upper extremity numbness 09/29/2022   Stage 3a chronic kidney disease (HCC) 09/29/2022   Cervical spondylosis with radiculopathy 01/05/2022   S/P cervical spinal fusion 01/03/2022   Cervical disc disorder with radiculopathy of cervical region 11/27/2021   Uncontrolled pain 05/27/2021   Syncope 05/13/2021   Depression with anxiety 05/13/2021   Chronic diastolic CHF (congestive heart failure) (HCC) 05/13/2021   Chronic back pain 05/13/2021   Chest tightness 05/13/2021   Lower abdominal pain 05/13/2021   Syncope and collapse 01/01/2021   Abnormal echocardiogram 01/01/2021   BPPV (benign paroxysmal positional vertigo), left 12/26/2020   Odynophagia    Dysphagia 12/15/2020   Postural dizziness with presyncope 12/14/2020   Lumbar post-laminectomy syndrome    Chronic pain 09/28/2019   Lumbar spinal stenosis    Radicular pain    Gait disorder    Difficulty in urination 07/13/2018   Back pain 07/12/2018   Hyperlipidemia 04/16/2018   GAD (generalized anxiety disorder) 04/16/2018   AKI (acute kidney injury) (HCC)    Benign essential HTN    Major depression, recurrent (HCC)    Lumbar radiculopathy 03/09/2018   Paraparesis (HCC) 03/09/2018   Essential hypertension    Chronic nonintractable headache     Leukocytosis    Acute blood loss anemia    Postoperative pain    Generalized weakness    Spinal stenosis at L4-L5 level 03/04/2018   Spinal stenosis of lumbar region 12/28/2015   Chest pain 04/16/2015    ONSET DATE: Referral date 04-01-23  REFERRING DIAG: M54.16 (ICD-10-CM) - Lumbar radiculopathy  THERAPY DIAG:  No diagnosis found.  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  States she has used the heat some which does help.  Has not gotten the hand warmers yet but plans to.  Has appt with Dr. Yetta Barre next week regarding the NCV test results  Pt accompanied by: self  PERTINENT HISTORY: Frequent falls:  Spinal cord stimulator insertion on 09-28-19; stimulator removal on 05-27-21 with resultant LLE weakness;  decompression and laminectomy on 12/28/15 at L4-L5  with redo decompression surgery on May 9,2019, post-op paraparesis with surgery to remove small hematoma; 07/12/18 fall at work (involved rolling chair) with hospital admission, transfer to CIR with d/c home 08/03/18; HTN, Bil foot surgery; left ventricular hypertrophy, chronic pain syndrome, dyspnea, hypertrophic cardiomyopathy; cervical fusion March 2023;  had carpal tunnel surgery RUE in April 2024:  LVH  PAIN:  08-20-23 Are you having pain? Intensity: 3/10 in low back:   8/10 pain reported in Rt thenar eminence area of Rt hand Location:  low back, Rt hand Description: soreness in low back; burning sensation in Rt hand Duration; varies depending on activity or movement being performed - worse with bending over or pulling leg up to  chest Aggravating factors -worse with bending over or pulling leg up to chest Alleviating factor - heating pad helps some; pain medication  PRECAUTIONS: Fall  WEIGHT BEARING RESTRICTIONS: No  FALLS: Has patient fallen in last 6 months? Yes. Number of falls 3  LIVING ENVIRONMENT: Lives with: lives with their spouse Lives in: House/apartment Stairs: Yes: External: 5 steps; on right going up Has following equipment at home: Single point cane, Wheelchair (manual), and [place]strand crutch  PLOF: Independent with basic ADLs, Independent with household mobility with device, Independent with community mobility with device, and Independent with transfers  PATIENT GOALS: improve strength Rt leg  OBJECTIVE:   DIAGNOSTIC FINDINGS: IMPRESSION: 1. Interval anterior discectomy and fusion from C4 through C6 with improved patency of the spinal canal and only mild residual foraminal narrowing. 2. Stable mild spinal stenosis and mild to moderate left foraminal narrowing at C3-4. 3. Stable mild spinal stenosis and mild foraminal narrowing bilaterally at C6-7. Mild foraminal narrowing bilaterally at C7-T1. 4. No cord deformity or abnormal cord signal.  No acute findings. No recent MRI of lumbar region  LOWER EXTREMITY MMT:    MMT Right Eval Left Eval  Hip flexion 3+   Hip extension    Hip abduction    Hip adduction    Hip internal rotation    Hip external rotation    Knee flexion 3-   Knee extension 3-   Ankle dorsiflexion 2-   Ankle plantarflexion    Ankle inversion    Ankle eversion    (Blank rows = not tested)   Today's Treatment: 08-20-23    GAIT: Gait pattern: decreased step length- Right, decreased stance time- Right, decreased hip/knee flexion- Right, and decreased ankle dorsiflexion- Right Distance walked: 325' (3 laps) with rollator; clinic distances during session between exercise locations Assistive device utilized: rollator  Level of assistance: SBA and CGA Comments:  blue rocker AFO donned on RLE      05-07-23: Gait velocity: with [place] Walk on AFO - 1"18 secs with rollator = .42 ft/sec   With Toe off AFO 31.9 secs with rollator = 1.03 ft/sec      THEREX: RLE strengthening exercises:  Step up  exercise RLE onto 4" step placed inside // bars, 10 reps x 2 sets:  Rt foot remained on step for entire exercise during each set  Step down from 2" step with LLE for Rt quad eccentric strengthening - 10 reps x 2 sets with bil. UE support   Tap ups onto 4" step with LLE 10 reps x 2 sets for RLE closed chain strengthening with bil. UE support on // bars; min tactile cue to keep Rt knee slightly flexed to prevent hyperextension  Leg press 35#  bil. LE's, 10 reps x 3 sets;  20# RLE only - 10 reps x 2 sets  RLE LAQ's x 10 reps after leg press exercise for stretching Rt quads    HEP - Med[employer]  Access Code: [email or web address] URL: [email or web address] Date: 05/06/2023 Prepared by: Maebelle Munroe  Exercises - Beginner Bridge  - 1 x daily - 7 x weekly - 1 sets - 10 reps - 3 hold - Supine Heel Slide  - 1 x daily - 7 x weekly - 1 sets - 10 reps - Bent Knee Fallouts  - 1 x daily - 7 x weekly - 1 sets - 10 reps - Sidelying Hip Abduction  - 1 x daily - 7 x weekly - 1 sets - 10 reps - Seated Knee Flexion Extension AROM   - 1 x daily - 7 x weekly - 1 sets - 10 reps   PATIENT EDUCATION: Education details:  discussed STG's and progress achieved Person educated: Patient Education method: Explanation, demonstration, handouts  Education comprehension: verbalized understanding, demonstrated understanding  HOME EXERCISE PROGRAM: To be issued  GOALS: Goals reviewed with patient? Yes  SHORT TERM GOALS: Target date: 07-24-23   1.   Amb. 230' with RW with SBA without requiring seated rest period. Baseline:  Goal status: Goal met 07-21-23  2.  Perform sit to stand from mat without UE support to demo improved LE strength. Baseline:    Goal status:  Not  met - ongoing - 07-21-23  3.  Modified independent step negotiation with use of bil. Hand rails using step by step sequence. Baseline: Goal status: Goal met 07-21-23  4.   Improve TUG score from 22.25 secs to </= 19 secs with use of rollator.  Baseline:  22.25 secs with rollator & blue rocker AFO on 06-25-23;    07-21-23=  32.78, 22.62 secs with rollator Goal status: Not met - ongoing - 07-21-23  5.   Stand for 1" without UE support to demo improved standing balance. Baseline:  Goal status: Goal met 07-21-23    LONG TERM GOALS: Target date: 07-20-23  Amb. 230' with RW with SBA without requiring seated rest period. Baseline: 90' with RW Goal status: Partially met - 06-23-23  2.  Improve TUG score by at least 4 secs with use of RW for assistance with ambulation Baseline: TBA;  46 secs on 7-11;  35.97 secs on 06-16-23;  22.25 secs with rollator with blue rocker AFO Goal status: Goal met 06-25-23  3.  Perform 5x sit to stand transfers with LUE support only to demo improved strength in RLE.  Baseline:  Goal status: Goal met 05-29-23 - RUE used   4.  Modified independent step negotiation with use of bil. Hand rails using step by step sequence. Baseline:  Goal status: 06-23-23 -- SBA to supervision needed for safety  5.  Obtain new AFO for increased RLE stability to decrease fall risk with gait. Baseline:  Goal status: In progress -  delivery scheduled for 06-24-23; Goal met 06-25-23  6.  Independent in updated HEP for RLE strengthening. Baseline:  Goal status: Goal met 06-16-23   NEW LONG TERM GOALS:  Target date;  08-21-23    1.  Amb. 300' with RW with blue rocker AFO on RLE with SBA without requiring seated rest period.    Baseline;  180', 40' with rollator    Status:  NEW  2.  Improve TUG score from 22.25 secs to </= 15 secs with use of rollator.  Baseline:  22.25 secs with rollator & blue rocker AFO on 06-25-23  Status:  NEW  3.  Pt will report ability to negotiate steps in her  garage at home modified independently for safely entering & exiting home.  Baseline;  SBA to CGA  Status:  NEW  4.  Increase gait velocity to >/= 1.5 ft/sec with use of AFO & rollator for increased gait efficiency.  Baseline:  With Toe off AFO 31.9 secs with rollator = 1.03 ft/sec    Status:  NEW  5.  Independent in updated HEP for RLE strengthening.  Baseline:  Goal status: NEW  ASSESSMENT:  CLINICAL IMPRESSION: PT session focused on RLE strengthening in closed chain position.  Pt amb. 325' with rollator with SBA.  Pt increased reps to 2 sets 10 reps for step ups onto 4" step with RLE and also step down exercise from 2" step for eccentric Rt quad strengthening.  Pt's c/o Rt hand pain continues to impact and limit weight bearing on RUE which subsequently increases weight on RLE and results in decreased weight bearing tolerance RLE due to muscle weakness and decreased muscle endurance.  Cont with POC.    OBJECTIVE IMPAIRMENTS: Abnormal gait, decreased balance, decreased coordination, decreased endurance, decreased strength, and impaired tone.   ACTIVITY LIMITATIONS: carrying, bending, squatting, stairs, transfers, and locomotion level  PARTICIPATION LIMITATIONS: cleaning, laundry, shopping, and community activity  PERSONAL FACTORS: Behavior pattern, Past/current experiences, and Time since onset of injury/illness/exacerbation are also affecting patient's functional outcome.   REHAB POTENTIAL: Good  CLINICAL DECISION MAKING: Evolving/moderate complexity  EVALUATION COMPLEXITY: Moderate  PLAN:  PT FREQUENCY: 2x/week  PT DURATION: 8 weeks  PLANNED INTERVENTIONS: Therapeutic exercises, Therapeutic activity, Neuromuscular re-education, Balance training, Gait training, Patient/Family education, Self Care, Stair training, Orthotic/Fit training, DME instructions, and Aquatic Therapy  PLAN FOR NEXT SESSION:  continue RLE strengthening   Kary Kos, PT 08/20/2023, 9:27  AM

## 2023-08-24 ENCOUNTER — Other Ambulatory Visit (HOSPITAL_BASED_OUTPATIENT_CLINIC_OR_DEPARTMENT_OTHER): Payer: Self-pay

## 2023-08-25 ENCOUNTER — Ambulatory Visit: Payer: PPO | Admitting: Physical Therapy

## 2023-08-25 DIAGNOSIS — R2689 Other abnormalities of gait and mobility: Secondary | ICD-10-CM

## 2023-08-25 DIAGNOSIS — R2681 Unsteadiness on feet: Secondary | ICD-10-CM

## 2023-08-25 DIAGNOSIS — M6281 Muscle weakness (generalized): Secondary | ICD-10-CM
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
## 2023-08-26 ENCOUNTER — Encounter: Payer: Self-pay | Admitting: Physical Therapy

## 2023-08-27 ENCOUNTER — Ambulatory Visit: Payer: Self-pay | Admitting: Physical Therapy

## 2023-08-27 DIAGNOSIS — M79641 Pain in right hand: Secondary | ICD-10-CM | POA: Diagnosis not present

## 2023-08-27 DIAGNOSIS — Z6828 Body mass index (BMI) 28.0-28.9, adult: Secondary | ICD-10-CM | POA: Diagnosis not present

## 2023-08-27 DIAGNOSIS — M4316 Spondylolisthesis, lumbar region: Secondary | ICD-10-CM | POA: Diagnosis not present

## 2023-08-27 DIAGNOSIS — M5416 Radiculopathy, lumbar region: Secondary | ICD-10-CM | POA: Diagnosis not present

## 2023-08-28 ENCOUNTER — Other Ambulatory Visit (HOSPITAL_COMMUNITY): Payer: Self-pay

## 2023-08-31 ENCOUNTER — Other Ambulatory Visit (HOSPITAL_COMMUNITY): Payer: Self-pay

## 2023-09-01 ENCOUNTER — Ambulatory Visit: Payer: PPO | Attending: Family Medicine | Admitting: Physical Therapy

## 2023-09-01 VITALS — BP 131/85 | HR 98

## 2023-09-01 DIAGNOSIS — R2689 Other abnormalities of gait and mobility: Secondary | ICD-10-CM | POA: Diagnosis not present

## 2023-09-01 DIAGNOSIS — R2681 Unsteadiness on feet: Secondary | ICD-10-CM | POA: Diagnosis not present

## 2023-09-01 DIAGNOSIS — M6281 Muscle weakness (generalized): Secondary | ICD-10-CM

## 2023-09-01 NOTE — Therapy (Unsigned)
OUTPATIENT PHYSICAL THERAPY NEURO TREATMENT NOTE/RE-CERT       Patient Name: [name] MRN: [id number] DOB:10-08-1961, 62 y.o., female Today's Date: 09/02/2023   PCP: [place], [name]., MD REFERRING PROVIDER: Ranelle Oyster, MD  END OF SESSION:  PT End of Session - 09/02/23 0816     Visit Number 25    Number of Visits 27    Date for PT Re-Evaluation 08/27/23    Authorization Type [employer]    Authorization Time Period 04-21-23 - 06-27-23;  06-25-23 - 08-27-23    Progress Note Due on Visit 30    PT Start Time 0932    PT Stop Time 1014    PT Time Calculation (min) 42 min    Equipment Utilized During Treatment Gait belt    Activity Tolerance Patient tolerated treatment well   limited by RLE weakness   Behavior During Therapy [facility] for tasks assessed/performed                                  Past Medical History:  Diagnosis Date   Anxiety    Aortic atherosclerosis (HCC) 04/16/2021   Atypical angina (HCC)    Back pain    related to spinal stenosis and disc problem, radiates down left buttocks to leg., weakness occ.   Chest pain    a. 03/2015 Cath: nl cors; b. 03/2021 Cor CTA: Ca2+ = 0. Nl Cors.   Chronic pain syndrome    Dyspnea    GERD (gastroesophageal reflux disease)    Grade I diastolic dysfunction    Headache    Hyperlipidemia    Hypertension    Lumbar post-laminectomy syndrome    LVH (left ventricular hypertrophy) 12/15/2020   a. 11/2020 Echo: EF 65-70%, no rwma, sev asymm LVH with IVSd 1.9 cm. No LVOT obs @ rest. Gr1 DD. Triv MR.   PONV (postoperative nausea and vomiting)    Pulmonary nodules    a. 03/2021 CT Chest: 3mm pulm nodules in bilat lower lobes. F/u 1 yr.   Right foot drop    Syncope    a. 11/2020 Zio: No significant arrhythmias.   Vaginal foreign object    "Uses Femring"   Past Surgical History:  Procedure Laterality Date   ABDOMINAL HYSTERECTOMY     ANTERIOR CERVICAL DECOMP/DISCECTOMY FUSION N/A  01/03/2022   Procedure: Anterior Cervical Decompression Fusion - Cervical four-Cervical five - Cervical five-Cervical six;  Surgeon: Tia Alert, MD;  Location: [facility] OR;  Service: Neurosurgery;  Laterality: N/A;   BIOPSY  12/16/2020   Procedure: BIOPSY;  Surgeon: Meryl Dare, MD;  Location: [facility] ENDOSCOPY;  Service: Endoscopy;;   CARDIAC CATHETERIZATION N/A 04/18/2015   Procedure: Left Heart Cath and Coronary Angiography;  Surgeon: Rinaldo Cloud, MD;  Location: [facility] INVASIVE CV LAB;  Service: Cardiovascular;  Laterality: N/A;   COLONOSCOPY     COLONOSCOPY W/ BIOPSIES AND POLYPECTOMY  2018   ESOPHAGOGASTRODUODENOSCOPY (EGD) WITH PROPOFOL N/A 12/16/2020   Procedure: ESOPHAGOGASTRODUODENOSCOPY (EGD) WITH PROPOFOL;  Surgeon: Meryl Dare, MD;  Location: [facility] ENDOSCOPY;  Service: Endoscopy;  Laterality: N/A;   FOOT SURGERY Bilateral    [facility] "bunion,bone spur, tendon" (1) -6'16, (1)-10'16   HEMATOMA EVACUATION N/A 01/05/2022   Procedure: Cervical Wound Exploration;  Surgeon: Coletta Memos, MD;  Location: [facility] OR;  Service: Neurosurgery;  Laterality: N/A;   IR EPIDUROGRAPHY  07/21/2018   LUMBAR LAMINECTOMY/DECOMPRESSION MICRODISCECTOMY Bilateral 12/28/2015  Procedure: MICRO LUMBAR DECOMPRESSION L4 - L5 BILATERALLY;  Surgeon: Jene Every, MD;  Location: [facility] ORS;  Service: Orthopedics;  Laterality: Bilateral;   LUMBAR LAMINECTOMY/DECOMPRESSION MICRODISCECTOMY Bilateral 03/04/2018   Procedure: Revision of Microlumbar Decompression Bilateral Lumbar Four-Five;  Surgeon: Jene Every, MD;  Location: [facility] OR;  Service: Orthopedics;  Laterality: Bilateral;  90 mins   SAVORY DILATION N/A 12/16/2020   Procedure: SAVORY DILATION;  Surgeon: Meryl Dare, MD;  Location: [facility] ENDOSCOPY;  Service: Endoscopy;  Laterality: N/A;   SPINAL CORD STIMULATOR INSERTION N/A 09/28/2019   Procedure: THORACIC SPINAL CORD STIMULATOR INSERTION;  Surgeon: Venita Lick, MD;  Location: [facility] OR;  Service:  Orthopedics;  Laterality: N/A;  2.5 hrs   SPINAL CORD STIMULATOR REMOVAL N/A 05/27/2021   Procedure: LUMBAR SPINAL CORD STIMULATOR REMOVAL;  Surgeon: Lucy Chris, MD;  Location: [facility] ORS;  Service: Neurosurgery;  Laterality: N/A;   TUBAL LIGATION     WISDOM TOOTH EXTRACTION     WOUND EXPLORATION N/A 03/04/2018   Procedure: EXPLORATION OF LUMBAR DECOMPRESSION WOUND;  Surgeon: Jene Every, MD;  Location: [facility] OR;  Service: Orthopedics;  Laterality: N/A;   Patient Active Problem List   Diagnosis Date Noted   Pain in thumb joint with movement of right hand 08/12/2023   Localized primary osteoarthritis of carpometacarpal joint of right thumb 07/29/2023   Right foot drop 07/29/2023   Frequent falls 04/14/2023   Gastroesophageal reflux disease 04/14/2023   Right upper extremity numbness 09/29/2022   Stage 3a chronic kidney disease (HCC) 09/29/2022   Cervical spondylosis with radiculopathy 01/05/2022   S/P cervical spinal fusion 01/03/2022   Cervical disc disorder with radiculopathy of cervical region 11/27/2021   Uncontrolled pain 05/27/2021   Syncope 05/13/2021   Depression with anxiety 05/13/2021   Chronic diastolic CHF (congestive heart failure) (HCC) 05/13/2021   Chronic back pain 05/13/2021   Chest tightness 05/13/2021   Lower abdominal pain 05/13/2021   Syncope and collapse 01/01/2021   Abnormal echocardiogram 01/01/2021   BPPV (benign paroxysmal positional vertigo), left 12/26/2020   Odynophagia    Dysphagia 12/15/2020   Postural dizziness with presyncope 12/14/2020   Lumbar post-laminectomy syndrome    Chronic pain 09/28/2019   Lumbar spinal stenosis    Radicular pain    Gait disorder    Difficulty in urination 07/13/2018   Back pain 07/12/2018   Hyperlipidemia 04/16/2018   GAD (generalized anxiety disorder) 04/16/2018   AKI (acute kidney injury) (HCC)    Benign essential HTN    Major depression, recurrent (HCC)    Lumbar radiculopathy 03/09/2018   Paraparesis (HCC)  03/09/2018   Essential hypertension    Chronic nonintractable headache    Leukocytosis    Acute blood loss anemia    Postoperative pain    Generalized weakness    Spinal stenosis at L4-L5 level 03/04/2018   Spinal stenosis of lumbar region 12/28/2015   Chest pain 04/16/2015    ONSET DATE: Referral date 04-01-23  REFERRING DIAG: M54.16 (ICD-10-CM) - Lumbar radiculopathy  THERAPY DIAG:  Other abnormalities of gait and mobility  Muscle weakness (generalized)  Unsteadiness on feet  Rationale for Evaluation and Treatment: Rehabilitation  SUBJECTIVE:  SUBJECTIVE STATEMENT: Pt reports she had a fall Sunday evening in her home - states her Rt leg gave way and she fell - hit Rt side of her head on the dresser but did not get hurt.  States she was getting ready to go to bed and took a few steps without having AFO donned on RLE.  Pt reports her Rt leg feels like it has been weaker recently - says she has not been doing her exercises because she has been focusing on taking care of her husband, who sustained a burn on his foot 2 weeks ago  Pt accompanied by: self  PERTINENT HISTORY: Frequent falls:  Spinal cord stimulator insertion on 09-28-19; stimulator removal on 05-27-21 with resultant LLE weakness;  decompression and laminectomy on 12/28/15 at L4-L5  with redo decompression surgery on May 9,2019, post-op paraparesis with surgery to remove small hematoma; 07/12/18 fall at work (involved rolling chair) with hospital admission, transfer to CIR with d/c home 08/03/18; HTN, Bil foot surgery; left ventricular hypertrophy, chronic pain syndrome, dyspnea, hypertrophic cardiomyopathy; cervical fusion March 2023;  had carpal tunnel surgery RUE in April 2024:  LVH  PAIN:  09-01-23 Are you having pain? Intensity: 7/10 in low  back:   4/10 pain reported in Rt thenar eminence area of Rt hand Location:  low back, Rt hand Description: soreness in low back; burning sensation in Rt hand Duration; varies depending on activity or movement being performed - worse with bending over or pulling leg up to chest Aggravating factors -worse with bending over or pulling leg up to chest Alleviating factor - heating pad helps some; pain medication  PRECAUTIONS: Fall  WEIGHT BEARING RESTRICTIONS: No  FALLS: Has patient fallen in last 6 months? Yes. Number of falls 3  LIVING ENVIRONMENT: Lives with: lives with their spouse Lives in: House/apartment Stairs: Yes: External: 5 steps; on right going up Has following equipment at home: Single point cane, Wheelchair (manual), and [employer] crutch  PLOF: Independent with basic ADLs, Independent with household mobility with device, Independent with community mobility with device, and Independent with transfers  PATIENT GOALS: improve strength Rt leg  OBJECTIVE:   DIAGNOSTIC FINDINGS: IMPRESSION: 1. Interval anterior discectomy and fusion from C4 through C6 with improved patency of the spinal canal and only mild residual foraminal narrowing. 2. Stable mild spinal stenosis and mild to moderate left foraminal narrowing at C3-4. 3. Stable mild spinal stenosis and mild foraminal narrowing bilaterally at C6-7. Mild foraminal narrowing bilaterally at C7-T1. 4. No cord deformity or abnormal cord signal.  No acute findings. No recent MRI of lumbar region  LOWER EXTREMITY MMT:    MMT Right Eval Left Eval  Hip flexion 3+   Hip extension    Hip abduction    Hip adduction    Hip internal rotation    Hip external rotation    Knee flexion 3-   Knee extension 3-   Ankle dorsiflexion 2-   Ankle plantarflexion    Ankle inversion    Ankle eversion    (Blank rows = not tested)   Today's Treatment: 09-01-23  Vitals:   09/01/23 0942  BP: 131/85  Pulse: 98     GAIT: Gait  pattern: decreased step length- Right, decreased stance time- Right, decreased hip/knee flexion- Right, and decreased ankle dorsiflexion- Right Distance walked: 86' with rollator; clinic distances during session between exercise locations Assistive device utilized: rollator  Level of assistance: SBA and CGA Comments: blue rocker AFO donned on RLE  05-07-23: Gait velocity: with [place] Walk on AFO - 1"18 secs with rollator = .42 ft/sec   With Toe off AFO 31.9 secs with rollator = 1.03 ft/sec      THEREX: RLE strengthening exercises:  TUG was assessed - pt amb. 10' with rollator with CGA, pt became unsteady with RLE weakness occurring during the turn and controlled fall was sustained. Pt was assisted to chair with max assist - no injury sustained  PT resumed  Pt performed LAQ's RLE without resistance 10 reps  Pt amb. Out of clinic to car at end of session modified independently  Self Care:   Discussed LTG's and status - pt requests to continue PT due to increased RLE weakness with plan for epidural injection in low back to be scheduled, injection in Rt hand to address pain due to CTS;  progress has been minimal in past few weeks due to these issues   HEP - Medbridge  Access Code: [email or web address] URL: [email or web address] Date: 05/06/2023 Prepared by: Maebelle Munroe  Exercises - Beginner Bridge  - 1 x daily - 7 x weekly - 1 sets - 10 reps - 3 hold - Supine Heel Slide  - 1 x daily - 7 x weekly - 1 sets - 10 reps - Bent Knee Fallouts  - 1 x daily - 7 x weekly - 1 sets - 10 reps - Sidelying Hip Abduction  - 1 x daily - 7 x weekly - 1 sets - 10 reps - Seated Knee Flexion Extension AROM   - 1 x daily - 7 x weekly - 1 sets - 10 reps   PATIENT EDUCATION: Education details:  discussed STG's and progress achieved Person educated: Patient Education method: Explanation, demonstration, handouts  Education comprehension: verbalized understanding, demonstrated  understanding  HOME EXERCISE PROGRAM: To be issued  GOALS: Goals reviewed with patient? Yes  SHORT TERM GOALS: Target date: 07-24-23   1.   Amb. 230' with RW with SBA without requiring seated rest period. Baseline:  Goal status: Goal met 07-21-23  2.  Perform sit to stand from mat without UE support to demo improved LE strength. Baseline:    Goal status:  Not met - ongoing - 07-21-23  3.  Modified independent step negotiation with use of bil. Hand rails using step by step sequence. Baseline: Goal status: Goal met 07-21-23  4.   Improve TUG score from 22.25 secs to </= 19 secs with use of rollator.  Baseline:  22.25 secs with rollator & blue rocker AFO on 06-25-23;    07-21-23=  32.78, 22.62 secs with rollator Goal status: Not met - ongoing - 07-21-23  5.   Stand for 1" without UE support to demo improved standing balance. Baseline:  Goal status: Goal met 07-21-23    LONG TERM GOALS: Target date: 07-20-23  Amb. 230' with RW with SBA without requiring seated rest period. Baseline: 64' with RW Goal status: Partially met - 06-23-23  2.  Improve TUG score by at least 4 secs with use of RW for assistance with ambulation Baseline: TBA;  46 secs on 7-11;  35.97 secs on 06-16-23;  22.25 secs with rollator with blue rocker AFO Goal status: Goal met 06-25-23  3.  Perform 5x sit to stand transfers with LUE support only to demo improved strength in RLE.  Baseline:  Goal status: Goal met 05-29-23 - RUE used   4.  Modified independent step negotiation with use of bil. Hand rails using step by step sequence. Baseline:  Goal status: 06-23-23 -- SBA to supervision needed for safety  5.  Obtain new AFO for increased RLE stability to decrease fall risk with gait. Baseline:  Goal status: In progress - delivery scheduled for 06-24-23; Goal met 06-25-23  6.  Independent in updated HEP for RLE strengthening. Baseline:  Goal status: Goal met 06-16-23   NEW LONG TERM GOALS:  Target date;  08-21-23     NEW TARGET DATE:  10-09-23     1.  Amb. 300' with RW with blue rocker AFO on RLE with SBA without requiring seated rest period.    Baseline;  180', 63' with rollator;  61' with rollator -- 09-01-23    Status:  In progress  2.  Improve TUG score from 22.25 secs to </= 15 secs with use of rollator.  REVISED:  score </= 19 secs  Baseline:  22.25 secs with rollator & blue rocker AFO on 06-25-23  Status:  In progress; REVISED  3.  Pt will report ability to negotiate steps in her garage at home modified independently for safely entering & exiting home.  Baseline; Goal met 09-01-23  Status:  MET  4.  Increase gait velocity to >/= 1.5 ft/sec with use of AFO & rollator for increased gait efficiency.  Baseline:  With Toe off AFO 31.9 secs with rollator = 1.03 ft/sec    Status:  In progress - Not tested on 09-01-23 due to increased RLE weakness  5.  Independent in updated HEP for RLE strengthening.  Baseline:  Goal status: Goal met 08-25-23   REVISED LTG's:  TARGET DATE 10-09-23  1.  Amb. 300' with RW with blue rocker AFO on RLE with SBA without requiring seated rest period.    Baseline;  180', 70' with rollator;  24' with rollator -- 09-01-23    Status:  In progress  2.  Improve TUG score from 22.25 secs to </= 15 secs with use of rollator.  REVISED:  score </= 19 secs  Baseline:  22.25 secs with rollator & blue rocker AFO on 06-25-23  Status:  In progress; REVISED  3.  Pt will report ability to negotiate steps in her garage at home modified independently for safely entering & exiting home.  Baseline; Goal met 09-01-23  Status:  In progress  4.  Increase gait velocity to >/= 1.5 ft/sec with use of AFO & rollator for increased gait efficiency.  Baseline:  With Toe off AFO 31.9 secs with rollator = 1.03 ft/sec    Status:  In progress - Not tested on 09-01-23 due to increased RLE weakness  5.  Independent in updated HEP for RLE strengthening.  Baseline:  Goal status: Goal met  08-25-23 ASSESSMENT:  CLINICAL IMPRESSION: PT session focused on assessment of LTG's:  pt has met LTG's #3 and 5 with LTG's #1, 2, and 4 ongoing as not met at this time.  Progress has been impacted by onset of Rt hand pain due to CTS with pt wearing hand splint on RUE;  pt reports decreased performance of HEP due to focus on assisting her husband who recently sustained a burn on his foot, resulting in mobility issues.  Pt requests to continue PT for another month to increase RLE strength and gait.  Cont with POC.    OBJECTIVE IMPAIRMENTS: Abnormal gait, decreased balance, decreased coordination, decreased endurance, decreased strength, and impaired tone.   ACTIVITY LIMITATIONS: carrying, bending, squatting, stairs, transfers, and locomotion level  PARTICIPATION LIMITATIONS: cleaning, laundry, shopping, and community activity  PERSONAL FACTORS: Behavior pattern, Past/current experiences, and Time since onset of injury/illness/exacerbation are also affecting patient's functional outcome.   REHAB POTENTIAL: Good  CLINICAL DECISION MAKING: Evolving/moderate complexity  EVALUATION COMPLEXITY: Moderate  PLAN:  PT FREQUENCY: 2x/week  PT DURATION: 4 weeks  PLANNED INTERVENTIONS: Therapeutic exercises, Therapeutic activity, Neuromuscular re-education, Balance training, Gait training, Patient/Family education, Self Care, Stair training, Orthotic/Fit training, DME instructions, and Aquatic Therapy  PLAN FOR NEXT SESSION:  continue RLE strengthening   Kary Kos, PT 09/02/2023, 8:19 AM

## 2023-09-02 ENCOUNTER — Encounter: Payer: Self-pay | Admitting: Physical Therapy

## 2023-09-05 ENCOUNTER — Other Ambulatory Visit: Payer: Self-pay | Admitting: Physical Medicine & Rehabilitation

## 2023-09-05 ENCOUNTER — Other Ambulatory Visit: Payer: Self-pay | Admitting: Family Medicine

## 2023-09-05 ENCOUNTER — Other Ambulatory Visit (HOSPITAL_COMMUNITY): Payer: Self-pay

## 2023-09-07 ENCOUNTER — Ambulatory Visit: Payer: PPO | Admitting: Physical Therapy

## 2023-09-07 NOTE — Telephone Encounter (Signed)
Last OV 03/2023 Discontinued on 10/11 by another provider.

## 2023-09-08 ENCOUNTER — Other Ambulatory Visit: Payer: Self-pay

## 2023-09-11 ENCOUNTER — Other Ambulatory Visit (HOSPITAL_COMMUNITY): Payer: Self-pay

## 2023-09-11 MED ORDER — GABAPENTIN 600 MG PO TABS
600.0000 mg | ORAL_TABLET | Freq: Three times a day (TID) | ORAL | 3 refills | Status: DC
  Filled 2023-09-11: qty 90, 30d supply, fill #0
  Filled 2024-04-14: qty 90, 30d supply, fill #1

## 2023-09-14 ENCOUNTER — Other Ambulatory Visit: Payer: Self-pay

## 2023-09-15 ENCOUNTER — Ambulatory Visit: Payer: PPO | Admitting: Physical Therapy

## 2023-09-15 DIAGNOSIS — R2689 Other abnormalities of gait and mobility: Secondary | ICD-10-CM | POA: Diagnosis not present

## 2023-09-15 DIAGNOSIS — M6281 Muscle weakness (generalized): Secondary | ICD-10-CM

## 2023-09-15 DIAGNOSIS — R2681 Unsteadiness on feet: Secondary | ICD-10-CM

## 2023-09-15 NOTE — Therapy (Unsigned)
OUTPATIENT PHYSICAL THERAPY NEURO TREATMENT NOTE/RE-CERT       Patient Name: [name] MRN: [id number] DOB:[birth year], 62 y.o., female Today's Date: 09/15/2023   PCP: [place], [name]., MD REFERRING PROVIDER: Ranelle Oyster, MD  END OF SESSION:                         Past Medical History:  Diagnosis Date   Anxiety    Aortic atherosclerosis (HCC) 04/16/2021   Atypical angina (HCC)    Back pain    related to spinal stenosis and disc problem, radiates down left buttocks to leg., weakness occ.   Chest pain    a. 03/2015 Cath: nl cors; b. 03/2021 Cor CTA: Ca2+ = 0. Nl Cors.   Chronic pain syndrome    Dyspnea    GERD (gastroesophageal reflux disease)    Grade I diastolic dysfunction    Headache    Hyperlipidemia    Hypertension    Lumbar post-laminectomy syndrome    LVH (left ventricular hypertrophy) 12/15/2020   a. 11/2020 Echo: EF 65-70%, no rwma, sev asymm LVH with IVSd 1.9 cm. No LVOT obs @ rest. Gr1 DD. Triv MR.   PONV (postoperative nausea and vomiting)    Pulmonary nodules    a. 03/2021 CT Chest: 3mm pulm nodules in bilat lower lobes. F/u 1 yr.   Right foot drop    Syncope    a. 11/2020 Zio: No significant arrhythmias.   Vaginal foreign object    "Uses Femring"   Past Surgical History:  Procedure Laterality Date   ABDOMINAL HYSTERECTOMY     ANTERIOR CERVICAL DECOMP/DISCECTOMY FUSION N/A 01/03/2022   Procedure: Anterior Cervical Decompression Fusion - Cervical four-Cervical five - Cervical five-Cervical six;  Surgeon: Tia Alert, MD;  Location: [facility] OR;  Service: Neurosurgery;  Laterality: N/A;   BIOPSY  12/16/2020   Procedure: BIOPSY;  Surgeon: Meryl Dare, MD;  Location: [facility] ENDOSCOPY;  Service: Endoscopy;;   CARDIAC CATHETERIZATION N/A 04/18/2015   Procedure: Left Heart Cath and Coronary Angiography;  Surgeon: Rinaldo Cloud, MD;  Location: [facility] INVASIVE CV LAB;  Service: Cardiovascular;  Laterality: N/A;   COLONOSCOPY      COLONOSCOPY W/ BIOPSIES AND POLYPECTOMY  2018   ESOPHAGOGASTRODUODENOSCOPY (EGD) WITH PROPOFOL N/A 12/16/2020   Procedure: ESOPHAGOGASTRODUODENOSCOPY (EGD) WITH PROPOFOL;  Surgeon: Meryl Dare, MD;  Location: [facility] ENDOSCOPY;  Service: Endoscopy;  Laterality: N/A;   FOOT SURGERY Bilateral    [facility] "bunion,bone spur, tendon" (1) -6'16, (1)-10'16   HEMATOMA EVACUATION N/A 01/05/2022   Procedure: Cervical Wound Exploration;  Surgeon: Coletta Memos, MD;  Location: [facility] OR;  Service: Neurosurgery;  Laterality: N/A;   IR EPIDUROGRAPHY  07/21/2018   LUMBAR LAMINECTOMY/DECOMPRESSION MICRODISCECTOMY Bilateral 12/28/2015   Procedure: MICRO LUMBAR DECOMPRESSION L4 - L5 BILATERALLY;  Surgeon: Jene Every, MD;  Location: [facility] ORS;  Service: Orthopedics;  Laterality: Bilateral;   LUMBAR LAMINECTOMY/DECOMPRESSION MICRODISCECTOMY Bilateral 03/04/2018   Procedure: Revision of Microlumbar Decompression Bilateral Lumbar Four-Five;  Surgeon: Jene Every, MD;  Location: [facility] OR;  Service: Orthopedics;  Laterality: Bilateral;  90 mins   SAVORY DILATION N/A 12/16/2020   Procedure: SAVORY DILATION;  Surgeon: Meryl Dare, MD;  Location: [facility] ENDOSCOPY;  Service: Endoscopy;  Laterality: N/A;   SPINAL CORD STIMULATOR INSERTION N/A 09/28/2019   Procedure: THORACIC SPINAL CORD STIMULATOR INSERTION;  Surgeon: Venita Lick, MD;  Location: [facility] OR;  Service: Orthopedics;  Laterality: N/A;  2.5 hrs   SPINAL CORD STIMULATOR  REMOVAL N/A 05/27/2021   Procedure: LUMBAR SPINAL CORD STIMULATOR REMOVAL;  Surgeon: Lucy Chris, MD;  Location: [facility] ORS;  Service: Neurosurgery;  Laterality: N/A;   TUBAL LIGATION     WISDOM TOOTH EXTRACTION     WOUND EXPLORATION N/A 03/04/2018   Procedure: EXPLORATION OF LUMBAR DECOMPRESSION WOUND;  Surgeon: Jene Every, MD;  Location: [facility] OR;  Service: Orthopedics;  Laterality: N/A;   Patient Active Problem List   Diagnosis Date Noted   Pain in thumb joint with movement of right  hand 08/12/2023   Localized primary osteoarthritis of carpometacarpal joint of right thumb 07/29/2023   Right foot drop 07/29/2023   Frequent falls 04/14/2023   Gastroesophageal reflux disease 04/14/2023   Right upper extremity numbness 09/29/2022   Stage 3a chronic kidney disease (HCC) 09/29/2022   Cervical spondylosis with radiculopathy 01/05/2022   S/P cervical spinal fusion 01/03/2022   Cervical disc disorder with radiculopathy of cervical region 11/27/2021   Uncontrolled pain 05/27/2021   Syncope 05/13/2021   Depression with anxiety 05/13/2021   Chronic diastolic CHF (congestive heart failure) (HCC) 05/13/2021   Chronic back pain 05/13/2021   Chest tightness 05/13/2021   Lower abdominal pain 05/13/2021   Syncope and collapse 01/01/2021   Abnormal echocardiogram 01/01/2021   BPPV (benign paroxysmal positional vertigo), left 12/26/2020   Odynophagia    Dysphagia 12/15/2020   Postural dizziness with presyncope 12/14/2020   Lumbar post-laminectomy syndrome    Chronic pain 09/28/2019   Lumbar spinal stenosis    Radicular pain    Gait disorder    Difficulty in urination 07/13/2018   Back pain 07/12/2018   Hyperlipidemia 04/16/2018   GAD (generalized anxiety disorder) 04/16/2018   AKI (acute kidney injury) (HCC)    Benign essential HTN    Major depression, recurrent (HCC)    Lumbar radiculopathy 03/09/2018   Paraparesis (HCC) 03/09/2018   Essential hypertension    Chronic nonintractable headache    Leukocytosis    Acute blood loss anemia    Postoperative pain    Generalized weakness    Spinal stenosis at L4-L5 level 03/04/2018   Spinal stenosis of lumbar region 12/28/2015   Chest pain 04/16/2015    ONSET DATE: Referral date 04-01-23  REFERRING DIAG: M54.16 (ICD-10-CM) - Lumbar radiculopathy  THERAPY DIAG:  No diagnosis found.  Rationale for Evaluation and Treatment: Rehabilitation  SUBJECTIVE:                                                                                                                                                                                              SUBJECTIVE STATEMENT: Pt reports she has been  doing exercises at home   Pt accompanied by: self  PERTINENT HISTORY: Frequent falls:  Spinal cord stimulator insertion on 09-28-19; stimulator removal on 05-27-21 with resultant LLE weakness;  decompression and laminectomy on 12/28/15 at L4-L5  with redo decompression surgery on May 9,2019, post-op paraparesis with surgery to remove small hematoma; 07/12/18 fall at work (involved rolling chair) with hospital admission, transfer to CIR with d/c home 08/03/18; HTN, Bil foot surgery; left ventricular hypertrophy, chronic pain syndrome, dyspnea, hypertrophic cardiomyopathy; cervical fusion March 2023;  had carpal tunnel surgery RUE in April 2024:  LVH  PAIN:  09-15-23 Are you having pain? Intensity: 6/10 in low back:   6/10 pain reported in Rt thenar eminence area of Rt hand Location:  low back, Rt hand Description: soreness in low back; burning sensation in Rt hand Duration; varies depending on activity or movement being performed - worse with bending over or pulling leg up to chest Aggravating factors -worse with bending over or pulling leg up to chest Alleviating factor - heating pad helps some; pain medication  PRECAUTIONS: Fall  WEIGHT BEARING RESTRICTIONS: No  FALLS: Has patient fallen in last 6 months? Yes. Number of falls 3  LIVING ENVIRONMENT: Lives with: lives with their spouse Lives in: House/apartment Stairs: Yes: External: 5 steps; on right going up Has following equipment at home: Single point cane, Wheelchair (manual), and [place]strand crutch  PLOF: Independent with basic ADLs, Independent with household mobility with device, Independent with community mobility with device, and Independent with transfers  PATIENT GOALS: improve strength Rt leg  OBJECTIVE:   DIAGNOSTIC FINDINGS: IMPRESSION: 1. Interval anterior discectomy  and fusion from C4 through C6 with improved patency of the spinal canal and only mild residual foraminal narrowing. 2. Stable mild spinal stenosis and mild to moderate left foraminal narrowing at C3-4. 3. Stable mild spinal stenosis and mild foraminal narrowing bilaterally at C6-7. Mild foraminal narrowing bilaterally at C7-T1. 4. No cord deformity or abnormal cord signal.  No acute findings. No recent MRI of lumbar region  LOWER EXTREMITY MMT:    MMT Right Eval Left Eval  Hip flexion 3+   Hip extension    Hip abduction    Hip adduction    Hip internal rotation    Hip external rotation    Knee flexion 3-   Knee extension 3-   Ankle dorsiflexion 2-   Ankle plantarflexion    Ankle inversion    Ankle eversion    (Blank rows = not tested)   Today's Treatment: 09-01-23  There were no vitals filed for this visit.    GAIT: Gait pattern: decreased step length- Right, decreased stance time- Right, decreased hip/knee flexion- Right, and decreased ankle dorsiflexion- Right Distance walked: 53' with rollator; clinic distances during session between exercise locations Assistive device utilized: rollator  Level of assistance: SBA and CGA Comments: blue rocker AFO donned on RLE      05-07-23: Gait velocity: with [place] Walk on AFO - 1"18 secs with rollator = .42 ft/sec   With Toe off AFO 31.9 secs with rollator = 1.03 ft/sec      THEREX: RLE strengthening exercises:  TUG was assessed - pt amb. 10' with rollator with CGA, pt became unsteady with RLE weakness occurring during the turn and controlled fall was sustained. Pt was assisted to chair with max assist - no injury sustained  PT resumed  Pt performed LAQ's RLE without resistance 10 reps  Pt amb. Out of clinic to car at end of  session modified independently  Self Care:   Discussed LTG's and status - pt requests to continue PT due to increased RLE weakness with plan for epidural injection in low back to be  scheduled, injection in Rt hand to address pain due to CTS;  progress has been minimal in past few weeks due to these issues ------------------------ Step ups onto 4" step - 2 sets 10 reps  Tap ups LLE 2 sets 10 reps Step down from 2" step 10 reps Squats - 10 reps  Stepping forward 5 reps;  abduction 10 reps ; stepping backwards 10 reps 1/2 march LLE 10 reps  Stepping over balance beam - 4 reps  HEP - Med[employer]  Access Code: [email or web address] URL: [email or web address] Date: 05/06/2023 Prepared by: Maebelle Munroe  Exercises - Beginner Bridge  - 1 x daily - 7 x weekly - 1 sets - 10 reps - 3 hold - Supine Heel Slide  - 1 x daily - 7 x weekly - 1 sets - 10 reps - Bent Knee Fallouts  - 1 x daily - 7 x weekly - 1 sets - 10 reps - Sidelying Hip Abduction  - 1 x daily - 7 x weekly - 1 sets - 10 reps - Seated Knee Flexion Extension AROM   - 1 x daily - 7 x weekly - 1 sets - 10 reps   PATIENT EDUCATION: Education details:  discussed STG's and progress achieved Person educated: Patient Education method: Explanation, demonstration, handouts  Education comprehension: verbalized understanding, demonstrated understanding  HOME EXERCISE PROGRAM: To be issued  GOALS: Goals reviewed with patient? Yes  SHORT TERM GOALS: Target date: 07-24-23   1.   Amb. 230' with RW with SBA without requiring seated rest period. Baseline:  Goal status: Goal met 07-21-23  2.  Perform sit to stand from mat without UE support to demo improved LE strength. Baseline:    Goal status:  Not met - ongoing - 07-21-23  3.  Modified independent step negotiation with use of bil. Hand rails using step by step sequence. Baseline: Goal status: Goal met 07-21-23  4.   Improve TUG score from 22.25 secs to </= 19 secs with use of rollator.  Baseline:  22.25 secs with rollator & blue rocker AFO on 06-25-23;    07-21-23=  32.78, 22.62 secs with rollator Goal status: Not met - ongoing - 07-21-23  5.   Stand for 1" without  UE support to demo improved standing balance. Baseline:  Goal status: Goal met 07-21-23    LONG TERM GOALS: Target date: 07-20-23  Amb. 230' with RW with SBA without requiring seated rest period. Baseline: 78' with RW Goal status: Partially met - 06-23-23  2.  Improve TUG score by at least 4 secs with use of RW for assistance with ambulation Baseline: TBA;  46 secs on 7-11;  35.97 secs on 06-16-23;  22.25 secs with rollator with blue rocker AFO Goal status: Goal met 06-25-23  3.  Perform 5x sit to stand transfers with LUE support only to demo improved strength in RLE.  Baseline:  Goal status: Goal met 05-29-23 - RUE used   4.  Modified independent step negotiation with use of bil. Hand rails using step by step sequence. Baseline:  Goal status: 06-23-23 -- SBA to supervision needed for safety  5.  Obtain new AFO for increased RLE stability to decrease fall risk with gait. Baseline:  Goal status: In progress - delivery scheduled for 06-24-23; Goal met 06-25-23  6.  Independent in updated HEP for RLE strengthening. Baseline:  Goal status: Goal met 06-16-23   NEW LONG TERM GOALS:  Target date;  08-21-23    NEW TARGET DATE:  10-09-23     1.  Amb. 300' with RW with blue rocker AFO on RLE with SBA without requiring seated rest period.    Baseline;  180', 33' with rollator;  31' with rollator -- 09-01-23    Status:  In progress  2.  Improve TUG score from 22.25 secs to </= 15 secs with use of rollator.  REVISED:  score </= 19 secs  Baseline:  22.25 secs with rollator & blue rocker AFO on 06-25-23  Status:  In progress; REVISED  3.  Pt will report ability to negotiate steps in her garage at home modified independently for safely entering & exiting home.  Baseline; Goal met 09-01-23  Status:  MET  4.  Increase gait velocity to >/= 1.5 ft/sec with use of AFO & rollator for increased gait efficiency.  Baseline:  With Toe off AFO 31.9 secs with rollator = 1.03 ft/sec    Status:  In progress -  Not tested on 09-01-23 due to increased RLE weakness  5.  Independent in updated HEP for RLE strengthening.  Baseline:  Goal status: Goal met 08-25-23   REVISED LTG's:  TARGET DATE 10-09-23  1.  Amb. 300' with RW with blue rocker AFO on RLE with SBA without requiring seated rest period.    Baseline;  180', 32' with rollator;  59' with rollator -- 09-01-23    Status:  In progress  2.  Improve TUG score from 22.25 secs to </= 15 secs with use of rollator.  REVISED:  score </= 19 secs  Baseline:  22.25 secs with rollator & blue rocker AFO on 06-25-23  Status:  In progress; REVISED  3.  Pt will report ability to negotiate steps in her garage at home modified independently for safely entering & exiting home.  Baseline; Goal met 09-01-23  Status:  In progress  4.  Increase gait velocity to >/= 1.5 ft/sec with use of AFO & rollator for increased gait efficiency.  Baseline:  With Toe off AFO 31.9 secs with rollator = 1.03 ft/sec    Status:  In progress - Not tested on 09-01-23 due to increased RLE weakness  5.  Independent in updated HEP for RLE strengthening.  Baseline:  Goal status: Goal met 08-25-23 ASSESSMENT:  CLINICAL IMPRESSION: PT session focused on assessment of LTG's:  pt has met LTG's #3 and 5 with LTG's #1, 2, and 4 ongoing as not met at this time.  Progress has been impacted by onset of Rt hand pain due to CTS with pt wearing hand splint on RUE;  pt reports decreased performance of HEP due to focus on assisting her husband who recently sustained a burn on his foot, resulting in mobility issues.  Pt requests to continue PT for another month to increase RLE strength and gait.  Cont with POC.    OBJECTIVE IMPAIRMENTS: Abnormal gait, decreased balance, decreased coordination, decreased endurance, decreased strength, and impaired tone.   ACTIVITY LIMITATIONS: carrying, bending, squatting, stairs, transfers, and locomotion level  PARTICIPATION LIMITATIONS: cleaning, laundry, shopping,  and community activity  PERSONAL FACTORS: Behavior pattern, Past/current experiences, and Time since onset of injury/illness/exacerbation are also affecting patient's functional outcome.   REHAB POTENTIAL: Good  CLINICAL DECISION MAKING: Evolving/moderate complexity  EVALUATION COMPLEXITY: Moderate  PLAN:  PT FREQUENCY: 2x/week  PT DURATION: 4  weeks  PLANNED INTERVENTIONS: Therapeutic exercises, Therapeutic activity, Neuromuscular re-education, Balance training, Gait training, Patient/Family education, Self Care, Stair training, Orthotic/Fit training, DME instructions, and Aquatic Therapy  PLAN FOR NEXT SESSION:  continue RLE strengthening   Adellyn Capek, Donavan Burnet, PT 09/15/2023, 10:22 AM

## 2023-09-16 ENCOUNTER — Encounter: Payer: Self-pay | Admitting: Physical Therapy

## 2023-09-17 ENCOUNTER — Ambulatory Visit: Payer: Self-pay | Admitting: Physical Therapy

## 2023-09-17 DIAGNOSIS — Z9889 Other specified postprocedural states: Secondary | ICD-10-CM | POA: Diagnosis not present

## 2023-09-17 DIAGNOSIS — M4316 Spondylolisthesis, lumbar region: Secondary | ICD-10-CM | POA: Diagnosis not present

## 2023-09-17 DIAGNOSIS — M5416 Radiculopathy, lumbar region: Secondary | ICD-10-CM | POA: Diagnosis not present

## 2023-09-17 DIAGNOSIS — G5601 Carpal tunnel syndrome, right upper limb: Secondary | ICD-10-CM | POA: Diagnosis not present

## 2023-09-22 ENCOUNTER — Other Ambulatory Visit (HOSPITAL_COMMUNITY): Payer: Self-pay

## 2023-09-22 ENCOUNTER — Encounter: Payer: Self-pay | Admitting: Physical Therapy

## 2023-09-22 ENCOUNTER — Ambulatory Visit: Payer: PPO | Admitting: Physical Therapy

## 2023-09-22 DIAGNOSIS — R2689 Other abnormalities of gait and mobility: Secondary | ICD-10-CM | POA: Diagnosis not present

## 2023-09-22 DIAGNOSIS — M6281 Muscle weakness (generalized): Secondary | ICD-10-CM

## 2023-09-22 NOTE — Therapy (Unsigned)
OUTPATIENT PHYSICAL THERAPY NEURO TREATMENT NOTE       Patient Name: [name] MRN: [id number] DOB:[birth year], 62 y.o., female Today's Date: 09/23/2023   PCP: [place], [name]., MD REFERRING PROVIDER: Ranelle Oyster, MD  END OF SESSION:  PT End of Session - 09/23/23 1612     Visit Number 27    Number of Visits 32    Date for PT Re-Evaluation 10/09/23    Authorization Type [employer]    Authorization Time Period 04-21-23 - 06-27-23;  06-25-23 - 08-27-23;  09-01-23 - 10-09-23    Progress Note Due on Visit 30    PT Start Time 0925    PT Stop Time 1015    PT Time Calculation (min) 50 min    Equipment Utilized During Treatment Gait belt    Activity Tolerance Patient tolerated treatment well   limited by RLE weakness   Behavior During Therapy [facility] for tasks assessed/performed                                    Past Medical History:  Diagnosis Date   Anxiety    Aortic atherosclerosis (HCC) 04/16/2021   Atypical angina (HCC)    Back pain    related to spinal stenosis and disc problem, radiates down left buttocks to leg., weakness occ.   Chest pain    a. 03/2015 Cath: nl cors; b. 03/2021 Cor CTA: Ca2+ = 0. Nl Cors.   Chronic pain syndrome    Dyspnea    GERD (gastroesophageal reflux disease)    Grade I diastolic dysfunction    Headache    Hyperlipidemia    Hypertension    Lumbar post-laminectomy syndrome    LVH (left ventricular hypertrophy) 12/15/2020   a. 11/2020 Echo: EF 65-70%, no rwma, sev asymm LVH with IVSd 1.9 cm. No LVOT obs @ rest. Gr1 DD. Triv MR.   PONV (postoperative nausea and vomiting)    Pulmonary nodules    a. 03/2021 CT Chest: 3mm pulm nodules in bilat lower lobes. F/u 1 yr.   Right foot drop    Syncope    a. 11/2020 Zio: No significant arrhythmias.   Vaginal foreign object    "Uses Femring"   Past Surgical History:  Procedure Laterality Date   ABDOMINAL HYSTERECTOMY     ANTERIOR CERVICAL DECOMP/DISCECTOMY  FUSION N/A 01/03/2022   Procedure: Anterior Cervical Decompression Fusion - Cervical four-Cervical five - Cervical five-Cervical six;  Surgeon: Tia Alert, MD;  Location: [facility] OR;  Service: Neurosurgery;  Laterality: N/A;   BIOPSY  12/16/2020   Procedure: BIOPSY;  Surgeon: Meryl Dare, MD;  Location: [facility] ENDOSCOPY;  Service: Endoscopy;;   CARDIAC CATHETERIZATION N/A 04/18/2015   Procedure: Left Heart Cath and Coronary Angiography;  Surgeon: Rinaldo Cloud, MD;  Location: [facility] INVASIVE CV LAB;  Service: Cardiovascular;  Laterality: N/A;   COLONOSCOPY     COLONOSCOPY W/ BIOPSIES AND POLYPECTOMY  2018   ESOPHAGOGASTRODUODENOSCOPY (EGD) WITH PROPOFOL N/A 12/16/2020   Procedure: ESOPHAGOGASTRODUODENOSCOPY (EGD) WITH PROPOFOL;  Surgeon: Meryl Dare, MD;  Location: [facility] ENDOSCOPY;  Service: Endoscopy;  Laterality: N/A;   FOOT SURGERY Bilateral    [facility] "bunion,bone spur, tendon" (1) -6'16, (1)-10'16   HEMATOMA EVACUATION N/A 01/05/2022   Procedure: Cervical Wound Exploration;  Surgeon: Coletta Memos, MD;  Location: [facility] OR;  Service: Neurosurgery;  Laterality: N/A;   IR EPIDUROGRAPHY  07/21/2018   LUMBAR  LAMINECTOMY/DECOMPRESSION MICRODISCECTOMY Bilateral 12/28/2015   Procedure: MICRO LUMBAR DECOMPRESSION L4 - L5 BILATERALLY;  Surgeon: Jene Every, MD;  Location: [facility] ORS;  Service: Orthopedics;  Laterality: Bilateral;   LUMBAR LAMINECTOMY/DECOMPRESSION MICRODISCECTOMY Bilateral 03/04/2018   Procedure: Revision of Microlumbar Decompression Bilateral Lumbar Four-Five;  Surgeon: Jene Every, MD;  Location: [facility] OR;  Service: Orthopedics;  Laterality: Bilateral;  90 mins   SAVORY DILATION N/A 12/16/2020   Procedure: SAVORY DILATION;  Surgeon: Meryl Dare, MD;  Location: [facility] ENDOSCOPY;  Service: Endoscopy;  Laterality: N/A;   SPINAL CORD STIMULATOR INSERTION N/A 09/28/2019   Procedure: THORACIC SPINAL CORD STIMULATOR INSERTION;  Surgeon: Venita Lick, MD;  Location: [facility] OR;  Service:  Orthopedics;  Laterality: N/A;  2.5 hrs   SPINAL CORD STIMULATOR REMOVAL N/A 05/27/2021   Procedure: LUMBAR SPINAL CORD STIMULATOR REMOVAL;  Surgeon: Lucy Chris, MD;  Location: [facility] ORS;  Service: Neurosurgery;  Laterality: N/A;   TUBAL LIGATION     WISDOM TOOTH EXTRACTION     WOUND EXPLORATION N/A 03/04/2018   Procedure: EXPLORATION OF LUMBAR DECOMPRESSION WOUND;  Surgeon: Jene Every, MD;  Location: [facility] OR;  Service: Orthopedics;  Laterality: N/A;   Patient Active Problem List   Diagnosis Date Noted   Pain in thumb joint with movement of right hand 08/12/2023   Localized primary osteoarthritis of carpometacarpal joint of right thumb 07/29/2023   Right foot drop 07/29/2023   Frequent falls 04/14/2023   Gastroesophageal reflux disease 04/14/2023   Right upper extremity numbness 09/29/2022   Stage 3a chronic kidney disease (HCC) 09/29/2022   Cervical spondylosis with radiculopathy 01/05/2022   S/P cervical spinal fusion 01/03/2022   Cervical disc disorder with radiculopathy of cervical region 11/27/2021   Uncontrolled pain 05/27/2021   Syncope 05/13/2021   Depression with anxiety 05/13/2021   Chronic diastolic CHF (congestive heart failure) (HCC) 05/13/2021   Chronic back pain 05/13/2021   Chest tightness 05/13/2021   Lower abdominal pain 05/13/2021   Syncope and collapse 01/01/2021   Abnormal echocardiogram 01/01/2021   BPPV (benign paroxysmal positional vertigo), left 12/26/2020   Odynophagia    Dysphagia 12/15/2020   Postural dizziness with presyncope 12/14/2020   Lumbar post-laminectomy syndrome    Chronic pain 09/28/2019   Lumbar spinal stenosis    Radicular pain    Gait disorder    Difficulty in urination 07/13/2018   Back pain 07/12/2018   Hyperlipidemia 04/16/2018   GAD (generalized anxiety disorder) 04/16/2018   AKI (acute kidney injury) (HCC)    Benign essential HTN    Major depression, recurrent (HCC)    Lumbar radiculopathy 03/09/2018   Paraparesis (HCC)  03/09/2018   Essential hypertension    Chronic nonintractable headache    Leukocytosis    Acute blood loss anemia    Postoperative pain    Generalized weakness    Spinal stenosis at L4-L5 level 03/04/2018   Spinal stenosis of lumbar region 12/28/2015   Chest pain 04/16/2015    ONSET DATE: Referral date 04-01-23  REFERRING DIAG: M54.16 (ICD-10-CM) - Lumbar radiculopathy  THERAPY DIAG:  Muscle weakness (generalized)  Other abnormalities of gait and mobility  Rationale for Evaluation and Treatment: Rehabilitation  SUBJECTIVE:  SUBJECTIVE STATEMENT: Pt reports the pain in her Rt hand returned on the 2nd day after the injection received last Thursday - has been sleeping with splint inconsistently.  Pt reports her back feels great - hardly any pain at this time.  Pt reports she didn't do exercises last week because she did a lot of walking running errands  Pt accompanied by: self  PERTINENT HISTORY: Frequent falls:  Spinal cord stimulator insertion on 09-28-19; stimulator removal on 05-27-21 with resultant LLE weakness;  decompression and laminectomy on 12/28/15 at L4-L5  with redo decompression surgery on May 9,2019, post-op paraparesis with surgery to remove small hematoma; 07/12/18 fall at work (involved rolling chair) with hospital admission, transfer to CIR with d/c home 08/03/18; HTN, Bil foot surgery; left ventricular hypertrophy, chronic pain syndrome, dyspnea, hypertrophic cardiomyopathy; cervical fusion March 2023;  had carpal tunnel surgery RUE in April 2024:  LVH  PAIN:  09-22-23 Are you having pain? Intensity: 1/10 in low back:   7/10 pain reported in Rt thenar eminence area of Rt hand Location:  low back, Rt hand Description: soreness in low back; burning sensation in Rt hand Duration; varies  depending on activity or movement being performed - worse with bending over or pulling leg up to chest Aggravating factors -worse with bending over or pulling leg up to chest Alleviating factor - heating pad helps some; pain medication  PRECAUTIONS: Fall  WEIGHT BEARING RESTRICTIONS: No  FALLS: Has patient fallen in last 6 months? Yes. Number of falls 3  LIVING ENVIRONMENT: Lives with: lives with their spouse Lives in: House/apartment Stairs: Yes: External: 5 steps; on right going up Has following equipment at home: Single point cane, Wheelchair (manual), and [place] crutch  PLOF: Independent with basic ADLs, Independent with household mobility with device, Independent with community mobility with device, and Independent with transfers  PATIENT GOALS: improve strength Rt leg  OBJECTIVE:   DIAGNOSTIC FINDINGS: IMPRESSION: 1. Interval anterior discectomy and fusion from C4 through C6 with improved patency of the spinal canal and only mild residual foraminal narrowing. 2. Stable mild spinal stenosis and mild to moderate left foraminal narrowing at C3-4. 3. Stable mild spinal stenosis and mild foraminal narrowing bilaterally at C6-7. Mild foraminal narrowing bilaterally at C7-T1. 4. No cord deformity or abnormal cord signal.  No acute findings. No recent MRI of lumbar region  LOWER EXTREMITY MMT:    MMT Right Eval Left Eval  Hip flexion 3+   Hip extension    Hip abduction    Hip adduction    Hip internal rotation    Hip external rotation    Knee flexion 3-   Knee extension 3-   Ankle dorsiflexion 2-   Ankle plantarflexion    Ankle inversion    Ankle eversion    (Blank rows = not tested)   Today's Treatment: 09-22-23  There were no vitals filed for this visit.    GAIT: Gait pattern: decreased step length- Right, decreased stance time- Right, decreased hip/knee flexion- Right, and decreased ankle dorsiflexion- Right Distance walked: clinic distances during  session between exercise locations Assistive device utilized: rollator  Level of assistance: SBA Comments: blue rocker AFO donned on RLE      05-07-23: Gait velocity: with [place] Walk on AFO - 1"18 secs with rollator = .42 ft/sec   With Toe off AFO 31.9 secs with rollator = 1.03 ft/sec      THEREX: RLE strengthening exercises:  Step up exercise onto 4" step placed inside // bars -  RLE step up with Rt foot remaining on step during entire exercise;  2 sets 10 reps with bil. UE support on // bars - pt wearing splint on Rt wrist  Tap up exercise with LLE to 4" step 10 reps x 2 sets for RLE isometric, closed chain strengthening  Step down exercise from 2" step placed inside // bars for Rt eccentric quad strengthening 10 reps x 2 sets  Pt performed LAQ's RLE with 2# 10 reps x 2 sets  Stepping forward 10 reps;  stepping laterally 10 reps; all with LLE for closed chain strengthening RLE and for improved RLE SLS  1/2 march LLE 5 reps for RLE strengthening  Leg press 30# bil. LE's 3 sets 10 reps      HEP - Medbridge  Access Code: [email or web address] URL: [email or web address] Date: 05/06/2023 Prepared by: Maebelle Munroe  Exercises - Beginner Bridge  - 1 x daily - 7 x weekly - 1 sets - 10 reps - 3 hold - Supine Heel Slide  - 1 x daily - 7 x weekly - 1 sets - 10 reps - Bent Knee Fallouts  - 1 x daily - 7 x weekly - 1 sets - 10 reps - Sidelying Hip Abduction  - 1 x daily - 7 x weekly - 1 sets - 10 reps - Seated Knee Flexion Extension AROM   - 1 x daily - 7 x weekly - 1 sets - 10 reps   PATIENT EDUCATION: Education details:  discussed STG's and progress achieved Person educated: Patient Education method: Explanation, demonstration, handouts  Education comprehension: verbalized understanding, demonstrated understanding  HOME EXERCISE PROGRAM: To be issued  GOALS: Goals reviewed with patient? Yes  SHORT TERM GOALS: Target date: 07-24-23   1.   Amb. 230' with RW with  SBA without requiring seated rest period. Baseline:  Goal status: Goal met 07-21-23  2.  Perform sit to stand from mat without UE support to demo improved LE strength. Baseline:    Goal status:  Not met - ongoing - 07-21-23  3.  Modified independent step negotiation with use of bil. Hand rails using step by step sequence. Baseline: Goal status: Goal met 07-21-23  4.   Improve TUG score from 22.25 secs to </= 19 secs with use of rollator.  Baseline:  22.25 secs with rollator & blue rocker AFO on 06-25-23;    07-21-23=  32.78, 22.62 secs with rollator Goal status: Not met - ongoing - 07-21-23  5.   Stand for 1" without UE support to demo improved standing balance. Baseline:  Goal status: Goal met 07-21-23    LONG TERM GOALS: Target date: 07-20-23  Amb. 230' with RW with SBA without requiring seated rest period. Baseline: 2' with RW Goal status: Partially met - 06-23-23  2.  Improve TUG score by at least 4 secs with use of RW for assistance with ambulation Baseline: TBA;  46 secs on 7-11;  35.97 secs on 06-16-23;  22.25 secs with rollator with blue rocker AFO Goal status: Goal met 06-25-23  3.  Perform 5x sit to stand transfers with LUE support only to demo improved strength in RLE.  Baseline:  Goal status: Goal met 05-29-23 - RUE used   4.  Modified independent step negotiation with use of bil. Hand rails using step by step sequence. Baseline:  Goal status: 06-23-23 -- SBA to supervision needed for safety  5.  Obtain new AFO for increased RLE stability to decrease fall risk with gait. Baseline:  Goal status: In progress - delivery scheduled for 06-24-23; Goal met 06-25-23  6.  Independent in updated HEP for RLE strengthening. Baseline:  Goal status: Goal met 06-16-23   NEW LONG TERM GOALS:  Target date;  08-21-23    NEW TARGET DATE:  10-09-23     1.  Amb. 300' with RW with blue rocker AFO on RLE with SBA without requiring seated rest period.    Baseline;  180', 46' with rollator;   60' with rollator -- 09-01-23    Status:  In progress  2.  Improve TUG score from 22.25 secs to </= 15 secs with use of rollator.  REVISED:  score </= 19 secs  Baseline:  22.25 secs with rollator & blue rocker AFO on 06-25-23  Status:  In progress; REVISED  3.  Pt will report ability to negotiate steps in her garage at home modified independently for safely entering & exiting home.  Baseline; Goal met 09-01-23  Status:  MET  4.  Increase gait velocity to >/= 1.5 ft/sec with use of AFO & rollator for increased gait efficiency.  Baseline:  With Toe off AFO 31.9 secs with rollator = 1.03 ft/sec    Status:  In progress - Not tested on 09-01-23 due to increased RLE weakness  5.  Independent in updated HEP for RLE strengthening.  Baseline:  Goal status: Goal met 08-25-23   REVISED LTG's:  TARGET DATE 10-09-23  1.  Amb. 300' with RW with blue rocker AFO on RLE with SBA without requiring seated rest period.    Baseline;  180', 15' with rollator;  49' with rollator -- 09-01-23    Status:  In progress  2.  Improve TUG score from 22.25 secs to </= 15 secs with use of rollator.  REVISED:  score </= 19 secs  Baseline:  22.25 secs with rollator & blue rocker AFO on 06-25-23  Status:  In progress; REVISED  3.  Pt will report ability to negotiate steps in her garage at home modified independently for safely entering & exiting home.  Baseline; Goal met 09-01-23  Status:  In progress  4.  Increase gait velocity to >/= 1.5 ft/sec with use of AFO & rollator for increased gait efficiency.  Baseline:  With Toe off AFO 31.9 secs with rollator = 1.03 ft/sec    Status:  In progress - Not tested on 09-01-23 due to increased RLE weakness  5.  Independent in updated HEP for RLE strengthening.  Baseline:  Goal status: Goal met 08-25-23 ASSESSMENT:  CLINICAL IMPRESSION: PT session focused on RLE strengthening primarily in closed chain position.  Pt continues to have decreased strength and decreased muscle  endurance RLE.  RLE tremors with fatigue with strengthening exercises but pt able to take short seated rest break and continue with exercises.  Weights on leg press remained 30# today.  Cont with POC.    OBJECTIVE IMPAIRMENTS: Abnormal gait, decreased balance, decreased coordination, decreased endurance, decreased strength, and impaired tone.   ACTIVITY LIMITATIONS: carrying, bending, squatting, stairs, transfers, and locomotion level  PARTICIPATION LIMITATIONS: cleaning, laundry, shopping, and community activity  PERSONAL FACTORS: Behavior pattern, Past/current experiences, and Time since onset of injury/illness/exacerbation are also affecting patient's functional outcome.   REHAB POTENTIAL: Good  CLINICAL DECISION MAKING: Evolving/moderate complexity  EVALUATION COMPLEXITY: Moderate  PLAN:  PT FREQUENCY: 2x/week  PT DURATION: 4 weeks  PLANNED INTERVENTIONS: Therapeutic exercises, Therapeutic activity, Neuromuscular re-education, Balance training, Gait training, Patient/Family education, Self Care, Stair training, Orthotic/Fit training, DME  instructions, and Aquatic Therapy  PLAN FOR NEXT SESSION:  continue RLE strengthening   Umaima Scholten, Donavan Burnet, PT 09/23/2023, 4:13 PM

## 2023-09-28 ENCOUNTER — Telehealth: Payer: Self-pay | Admitting: Family Medicine

## 2023-09-28 NOTE — Telephone Encounter (Signed)
Experiencing heartburn for the first time, wants to chat with someone about OTC meds

## 2023-09-28 NOTE — Telephone Encounter (Signed)
I called and spoke with patient. PCP recommends Prilosec 20 mg daily. Patient will give it a try and let us know if it does not improve.

## 2023-09-29 ENCOUNTER — Encounter: Payer: Self-pay | Admitting: Physical Therapy

## 2023-09-29 ENCOUNTER — Ambulatory Visit: Payer: PPO | Attending: Family Medicine | Admitting: Physical Therapy

## 2023-09-29 ENCOUNTER — Other Ambulatory Visit (HOSPITAL_COMMUNITY): Payer: Self-pay

## 2023-09-29 DIAGNOSIS — M6281 Muscle weakness (generalized): Secondary | ICD-10-CM | POA: Insufficient documentation

## 2023-09-29 DIAGNOSIS — R2689 Other abnormalities of gait and mobility: Secondary | ICD-10-CM | POA: Diagnosis not present

## 2023-09-29 DIAGNOSIS — M5416 Radiculopathy, lumbar region: Secondary | ICD-10-CM | POA: Diagnosis not present

## 2023-09-29 MED ORDER — METHYLPREDNISOLONE 4 MG PO TBPK
ORAL_TABLET | ORAL | 0 refills | Status: DC
  Filled 2023-09-29: qty 21, 6d supply, fill #0

## 2023-09-29 NOTE — Therapy (Signed)
OUTPATIENT PHYSICAL THERAPY NEURO TREATMENT NOTE       Patient Name: [name] MRN: [id number] DOB:1961/10/11, 62 y.o., female Today's Date: 09/29/2023   PCP: [place], [name]., MD REFERRING PROVIDER: Ranelle Oyster, MD  END OF SESSION:  PT End of Session - 09/29/23 1952     Visit Number 28    Number of Visits 32    Date for PT Re-Evaluation 10/09/23    Authorization Type [employer]    Authorization Time Period 04-21-23 - 06-27-23;  06-25-23 - 08-27-23;  09-01-23 - 10-09-23    Progress Note Due on Visit 30    PT Start Time 0930    PT Stop Time 1005    PT Time Calculation (min) 35 min    Equipment Utilized During Treatment Gait belt    Activity Tolerance Patient tolerated treatment well   limited by RLE weakness   Behavior During Therapy [facility] for tasks assessed/performed                                     Past Medical History:  Diagnosis Date   Anxiety    Aortic atherosclerosis (HCC) 04/16/2021   Atypical angina (HCC)    Back pain    related to spinal stenosis and disc problem, radiates down left buttocks to leg., weakness occ.   Chest pain    a. 03/2015 Cath: nl cors; b. 03/2021 Cor CTA: Ca2+ = 0. Nl Cors.   Chronic pain syndrome    Dyspnea    GERD (gastroesophageal reflux disease)    Grade I diastolic dysfunction    Headache    Hyperlipidemia    Hypertension    Lumbar post-laminectomy syndrome    LVH (left ventricular hypertrophy) 12/15/2020   a. 11/2020 Echo: EF 65-70%, no rwma, sev asymm LVH with IVSd 1.9 cm. No LVOT obs @ rest. Gr1 DD. Triv MR.   PONV (postoperative nausea and vomiting)    Pulmonary nodules    a. 03/2021 CT Chest: 3mm pulm nodules in bilat lower lobes. F/u 1 yr.   Right foot drop    Syncope    a. 11/2020 Zio: No significant arrhythmias.   Vaginal foreign object    "Uses Femring"   Past Surgical History:  Procedure Laterality Date   ABDOMINAL HYSTERECTOMY     ANTERIOR CERVICAL DECOMP/DISCECTOMY  FUSION N/A 01/03/2022   Procedure: Anterior Cervical Decompression Fusion - Cervical four-Cervical five - Cervical five-Cervical six;  Surgeon: Tia Alert, MD;  Location: [facility] OR;  Service: Neurosurgery;  Laterality: N/A;   BIOPSY  12/16/2020   Procedure: BIOPSY;  Surgeon: Meryl Dare, MD;  Location: [facility] ENDOSCOPY;  Service: Endoscopy;;   CARDIAC CATHETERIZATION N/A 04/18/2015   Procedure: Left Heart Cath and Coronary Angiography;  Surgeon: Rinaldo Cloud, MD;  Location: [facility] INVASIVE CV LAB;  Service: Cardiovascular;  Laterality: N/A;   COLONOSCOPY     COLONOSCOPY W/ BIOPSIES AND POLYPECTOMY  2018   ESOPHAGOGASTRODUODENOSCOPY (EGD) WITH PROPOFOL N/A 12/16/2020   Procedure: ESOPHAGOGASTRODUODENOSCOPY (EGD) WITH PROPOFOL;  Surgeon: Meryl Dare, MD;  Location: [facility] ENDOSCOPY;  Service: Endoscopy;  Laterality: N/A;   FOOT SURGERY Bilateral    [facility] "bunion,bone spur, tendon" (1) -6'16, (1)-10'16   HEMATOMA EVACUATION N/A 01/05/2022   Procedure: Cervical Wound Exploration;  Surgeon: Coletta Memos, MD;  Location: [facility] OR;  Service: Neurosurgery;  Laterality: N/A;   IR EPIDUROGRAPHY  07/21/2018  LUMBAR LAMINECTOMY/DECOMPRESSION MICRODISCECTOMY Bilateral 12/28/2015   Procedure: MICRO LUMBAR DECOMPRESSION L4 - L5 BILATERALLY;  Surgeon: Jene Every, MD;  Location: [facility] ORS;  Service: Orthopedics;  Laterality: Bilateral;   LUMBAR LAMINECTOMY/DECOMPRESSION MICRODISCECTOMY Bilateral 03/04/2018   Procedure: Revision of Microlumbar Decompression Bilateral Lumbar Four-Five;  Surgeon: Jene Every, MD;  Location: [facility] OR;  Service: Orthopedics;  Laterality: Bilateral;  90 mins   SAVORY DILATION N/A 12/16/2020   Procedure: SAVORY DILATION;  Surgeon: Meryl Dare, MD;  Location: [facility] ENDOSCOPY;  Service: Endoscopy;  Laterality: N/A;   SPINAL CORD STIMULATOR INSERTION N/A 09/28/2019   Procedure: THORACIC SPINAL CORD STIMULATOR INSERTION;  Surgeon: Venita Lick, MD;  Location: [facility] OR;  Service:  Orthopedics;  Laterality: N/A;  2.5 hrs   SPINAL CORD STIMULATOR REMOVAL N/A 05/27/2021   Procedure: LUMBAR SPINAL CORD STIMULATOR REMOVAL;  Surgeon: Lucy Chris, MD;  Location: [facility] ORS;  Service: Neurosurgery;  Laterality: N/A;   TUBAL LIGATION     WISDOM TOOTH EXTRACTION     WOUND EXPLORATION N/A 03/04/2018   Procedure: EXPLORATION OF LUMBAR DECOMPRESSION WOUND;  Surgeon: Jene Every, MD;  Location: [facility] OR;  Service: Orthopedics;  Laterality: N/A;   Patient Active Problem List   Diagnosis Date Noted   Pain in thumb joint with movement of right hand 08/12/2023   Localized primary osteoarthritis of carpometacarpal joint of right thumb 07/29/2023   Right foot drop 07/29/2023   Frequent falls 04/14/2023   Gastroesophageal reflux disease 04/14/2023   Right upper extremity numbness 09/29/2022   Stage 3a chronic kidney disease (HCC) 09/29/2022   Cervical spondylosis with radiculopathy 01/05/2022   S/P cervical spinal fusion 01/03/2022   Cervical disc disorder with radiculopathy of cervical region 11/27/2021   Uncontrolled pain 05/27/2021   Syncope 05/13/2021   Depression with anxiety 05/13/2021   Chronic diastolic CHF (congestive heart failure) (HCC) 05/13/2021   Chronic back pain 05/13/2021   Chest tightness 05/13/2021   Lower abdominal pain 05/13/2021   Syncope and collapse 01/01/2021   Abnormal echocardiogram 01/01/2021   BPPV (benign paroxysmal positional vertigo), left 12/26/2020   Odynophagia    Dysphagia 12/15/2020   Postural dizziness with presyncope 12/14/2020   Lumbar post-laminectomy syndrome    Chronic pain 09/28/2019   Lumbar spinal stenosis    Radicular pain    Gait disorder    Difficulty in urination 07/13/2018   Back pain 07/12/2018   Hyperlipidemia 04/16/2018   GAD (generalized anxiety disorder) 04/16/2018   AKI (acute kidney injury) (HCC)    Benign essential HTN    Major depression, recurrent (HCC)    Lumbar radiculopathy 03/09/2018   Paraparesis (HCC)  03/09/2018   Essential hypertension    Chronic nonintractable headache    Leukocytosis    Acute blood loss anemia    Postoperative pain    Generalized weakness    Spinal stenosis at L4-L5 level 03/04/2018   Spinal stenosis of lumbar region 12/28/2015   Chest pain 04/16/2015    ONSET DATE: Referral date 04-01-23  REFERRING DIAG: M54.16 (ICD-10-CM) - Lumbar radiculopathy  THERAPY DIAG:  Other abnormalities of gait and mobility  Muscle weakness (generalized)  Rationale for Evaluation and Treatment: Rehabilitation  SUBJECTIVE:  SUBJECTIVE STATEMENT:  Pt reports she had severe back pain last Wed. Night/early Thursday morning - says she did a lot getting ready for Thanksgiving and stayed up until about 4:00 am with her niece helping her get everything ready; pt reports the back pain was so severe on Thursday that she was nauseated and unable to eat. Pt states she  has appt with Dr. Yetta Barre this morning at 10:30 - needs to leave PT appt about 10" early.  Pt reports the back pain is better than it was last week but still hurts rather bad.  Pt accompanied by: self  PERTINENT HISTORY: Frequent falls:  Spinal cord stimulator insertion on 09-28-19; stimulator removal on 05-27-21 with resultant LLE weakness;  decompression and laminectomy on 12/28/15 at L4-L5  with redo decompression surgery on May 9,2019, post-op paraparesis with surgery to remove small hematoma; 07/12/18 fall at work (involved rolling chair) with hospital admission, transfer to CIR with d/c home 08/03/18; HTN, Bil foot surgery; left ventricular hypertrophy, chronic pain syndrome, dyspnea, hypertrophic cardiomyopathy; cervical fusion March 2023;  had carpal tunnel surgery RUE in April 2024:  LVH  PAIN:  09-29-23 Are you having pain? Intensity: 7-8/10 in  low back:   7/10 pain reported in Rt thenar eminence area of Rt hand Location:  low back, Rt hand Description: soreness in low back; burning sensation in Rt hand Duration; varies depending on activity or movement being performed - worse with bending over or pulling leg up to chest Aggravating factors - certain movements; unable to turn or rotate Alleviating factor - heating pad helps some; pain medication  PRECAUTIONS: Fall  WEIGHT BEARING RESTRICTIONS: No  FALLS: Has patient fallen in last 6 months? Yes. Number of falls 3  LIVING ENVIRONMENT: Lives with: lives with their spouse Lives in: House/apartment Stairs: Yes: External: 5 steps; on right going up Has following equipment at home: Single point cane, Wheelchair (manual), and [employer] crutch  PLOF: Independent with basic ADLs, Independent with household mobility with device, Independent with community mobility with device, and Independent with transfers  PATIENT GOALS: improve strength Rt leg  OBJECTIVE:   DIAGNOSTIC FINDINGS: IMPRESSION: 1. Interval anterior discectomy and fusion from C4 through C6 with improved patency of the spinal canal and only mild residual foraminal narrowing. 2. Stable mild spinal stenosis and mild to moderate left foraminal narrowing at C3-4. 3. Stable mild spinal stenosis and mild foraminal narrowing bilaterally at C6-7. Mild foraminal narrowing bilaterally at C7-T1. 4. No cord deformity or abnormal cord signal.  No acute findings. No recent MRI of lumbar region  LOWER EXTREMITY MMT:    MMT Right Eval Left Eval  Hip flexion 3+   Hip extension    Hip abduction    Hip adduction    Hip internal rotation    Hip external rotation    Knee flexion 3-   Knee extension 3-   Ankle dorsiflexion 2-   Ankle plantarflexion    Ankle inversion    Ankle eversion    (Blank rows = not tested)   Today's Treatment: 09-29-23  There were no vitals filed for this visit.    GAIT: Gait pattern:  decreased step length- Right, decreased stance time- Right, decreased hip/knee flexion- Right, and decreased ankle dorsiflexion- Right Distance walked: 230' (2 laps); 40' x 2 reps (to & from [employer] from 1st mat table) Assistive device utilized: rollator  Level of assistance: SBA Comments: blue rocker AFO donned on RLE      05-07-23: Gait velocity: with [employer]  Walk on AFO - 1"18 secs with rollator = .42 ft/sec   With Toe off AFO 31.9 secs with rollator = 1.03 ft/sec   Self care:  discussed status with increased back pain reported   THEREX:  SciFit level 2.0 x 6" with bil. Ue's and LE's - pt reported no back pain with this exercise  No standing exercises performed due to c/o increased back pain in today's session        HEP - Medbridge  Access Code: [email or web address] URL: [email or web address] Date: 05/06/2023 Prepared by: Maebelle Munroe  Exercises - Beginner Bridge  - 1 x daily - 7 x weekly - 1 sets - 10 reps - 3 hold - Supine Heel Slide  - 1 x daily - 7 x weekly - 1 sets - 10 reps - Bent Knee Fallouts  - 1 x daily - 7 x weekly - 1 sets - 10 reps - Sidelying Hip Abduction  - 1 x daily - 7 x weekly - 1 sets - 10 reps - Seated Knee Flexion Extension AROM   - 1 x daily - 7 x weekly - 1 sets - 10 reps   PATIENT EDUCATION: Education details:  discussed STG's and progress achieved Person educated: Patient Education method: Explanation, demonstration, handouts  Education comprehension: verbalized understanding, demonstrated understanding  HOME EXERCISE PROGRAM: To be issued  GOALS: Goals reviewed with patient? Yes  SHORT TERM GOALS: Target date: 07-24-23   1.   Amb. 230' with RW with SBA without requiring seated rest period. Baseline:  Goal status: Goal met 07-21-23  2.  Perform sit to stand from mat without UE support to demo improved LE strength. Baseline:    Goal status:  Not met - ongoing - 07-21-23  3.  Modified independent step negotiation with use of  bil. Hand rails using step by step sequence. Baseline: Goal status: Goal met 07-21-23  4.   Improve TUG score from 22.25 secs to </= 19 secs with use of rollator.  Baseline:  22.25 secs with rollator & blue rocker AFO on 06-25-23;    07-21-23=  32.78, 22.62 secs with rollator Goal status: Not met - ongoing - 07-21-23  5.   Stand for 1" without UE support to demo improved standing balance. Baseline:  Goal status: Goal met 07-21-23    LONG TERM GOALS: Target date: 07-20-23  Amb. 230' with RW with SBA without requiring seated rest period. Baseline: 80' with RW Goal status: Partially met - 06-23-23  2.  Improve TUG score by at least 4 secs with use of RW for assistance with ambulation Baseline: TBA;  46 secs on 7-11;  35.97 secs on 06-16-23;  22.25 secs with rollator with blue rocker AFO Goal status: Goal met 06-25-23  3.  Perform 5x sit to stand transfers with LUE support only to demo improved strength in RLE.  Baseline:  Goal status: Goal met 05-29-23 - RUE used   4.  Modified independent step negotiation with use of bil. Hand rails using step by step sequence. Baseline:  Goal status: 06-23-23 -- SBA to supervision needed for safety  5.  Obtain new AFO for increased RLE stability to decrease fall risk with gait. Baseline:  Goal status: In progress - delivery scheduled for 06-24-23; Goal met 06-25-23  6.  Independent in updated HEP for RLE strengthening. Baseline:  Goal status: Goal met 06-16-23   NEW LONG TERM GOALS:  Target date;  08-21-23    NEW TARGET DATE:  10-09-23  1.  Amb. 300' with RW with blue rocker AFO on RLE with SBA without requiring seated rest period.    Baseline;  180', 47' with rollator;  86' with rollator -- 09-01-23    Status:  In progress  2.  Improve TUG score from 22.25 secs to </= 15 secs with use of rollator.  REVISED:  score </= 19 secs  Baseline:  22.25 secs with rollator & blue rocker AFO on 06-25-23  Status:  In progress; REVISED  3.  Pt will report  ability to negotiate steps in her garage at home modified independently for safely entering & exiting home.  Baseline; Goal met 09-01-23  Status:  MET  4.  Increase gait velocity to >/= 1.5 ft/sec with use of AFO & rollator for increased gait efficiency.  Baseline:  With Toe off AFO 31.9 secs with rollator = 1.03 ft/sec    Status:  In progress - Not tested on 09-01-23 due to increased RLE weakness  5.  Independent in updated HEP for RLE strengthening.  Baseline:  Goal status: Goal met 08-25-23   REVISED LTG's:  TARGET DATE 10-09-23  1.  Amb. 300' with RW with blue rocker AFO on RLE with SBA without requiring seated rest period.    Baseline;  180', 77' with rollator;  36' with rollator -- 09-01-23    Status:  In progress  2.  Improve TUG score from 22.25 secs to </= 15 secs with use of rollator.  REVISED:  score </= 19 secs  Baseline:  22.25 secs with rollator & blue rocker AFO on 06-25-23  Status:  In progress; REVISED  3.  Pt will report ability to negotiate steps in her garage at home modified independently for safely entering & exiting home.  Baseline; Goal met 09-01-23  Status:  In progress  4.  Increase gait velocity to >/= 1.5 ft/sec with use of AFO & rollator for increased gait efficiency.  Baseline:  With Toe off AFO 31.9 secs with rollator = 1.03 ft/sec    Status:  In progress - Not tested on 09-01-23 due to increased RLE weakness  5.  Independent in updated HEP for RLE strengthening.  Baseline:  Goal status: Goal met 08-25-23 ASSESSMENT:  CLINICAL IMPRESSION: PT session focused gait training with use of rollator and use of SciFit for seated RLE strengthening.  Pt's c/o moderate low back pain (started Wed. Night and continued into Thanksgiving Day) limited standing tolerance in today's session, with no attempt of standing exercise in closed chain position in today's session.  Pt to see neurosurgeon, Dr. Yetta Barre, following PT appt today.  Cont with POC.    OBJECTIVE  IMPAIRMENTS: Abnormal gait, decreased balance, decreased coordination, decreased endurance, decreased strength, and impaired tone.   ACTIVITY LIMITATIONS: carrying, bending, squatting, stairs, transfers, and locomotion level  PARTICIPATION LIMITATIONS: cleaning, laundry, shopping, and community activity  PERSONAL FACTORS: Behavior pattern, Past/current experiences, and Time since onset of injury/illness/exacerbation are also affecting patient's functional outcome.   REHAB POTENTIAL: Good  CLINICAL DECISION MAKING: Evolving/moderate complexity  EVALUATION COMPLEXITY: Moderate  PLAN:  PT FREQUENCY: 2x/week  PT DURATION: 4 weeks  PLANNED INTERVENTIONS: Therapeutic exercises, Therapeutic activity, Neuromuscular re-education, Balance training, Gait training, Patient/Family education, Self Care, Stair training, Orthotic/Fit training, DME instructions, and Aquatic Therapy  PLAN FOR NEXT SESSION:  continue RLE strengthening   Kary Kos, PT 09/29/2023, 7:54 PM

## 2023-09-30 ENCOUNTER — Other Ambulatory Visit: Payer: Self-pay

## 2023-09-30 ENCOUNTER — Encounter: Payer: Self-pay | Admitting: Physical Medicine & Rehabilitation

## 2023-09-30 ENCOUNTER — Other Ambulatory Visit: Payer: Self-pay | Admitting: Family Medicine

## 2023-09-30 ENCOUNTER — Encounter: Payer: PPO | Attending: Physical Medicine & Rehabilitation | Admitting: Physical Medicine & Rehabilitation

## 2023-09-30 ENCOUNTER — Other Ambulatory Visit (HOSPITAL_COMMUNITY): Payer: Self-pay

## 2023-09-30 VITALS — BP 154/87 | HR 85 | Ht 65.0 in | Wt 169.0 lb

## 2023-09-30 DIAGNOSIS — M961 Postlaminectomy syndrome, not elsewhere classified: Secondary | ICD-10-CM | POA: Insufficient documentation

## 2023-09-30 DIAGNOSIS — M4722 Other spondylosis with radiculopathy, cervical region: Secondary | ICD-10-CM | POA: Insufficient documentation

## 2023-09-30 DIAGNOSIS — M5416 Radiculopathy, lumbar region: Secondary | ICD-10-CM | POA: Diagnosis not present

## 2023-09-30 MED ORDER — DULOXETINE HCL 30 MG PO CPEP
60.0000 mg | ORAL_CAPSULE | Freq: Every day | ORAL | 3 refills | Status: DC
  Filled 2023-09-30: qty 180, 90d supply, fill #0

## 2023-09-30 MED ORDER — TRAMADOL HCL 50 MG PO TABS
50.0000 mg | ORAL_TABLET | Freq: Four times a day (QID) | ORAL | 2 refills | Status: DC | PRN
  Filled 2023-09-30: qty 60, 15d supply, fill #0
  Filled 2023-10-05: qty 28, 7d supply, fill #0

## 2023-09-30 NOTE — Telephone Encounter (Signed)
Prescription Request  09/30/2023  LOV: 04/14/2023  What is the name of the medication or equipment? baclofen (LIORESAL) 10 MG tablet   Have you contacted your pharmacy to request a refill? Yes   Which pharmacy would you like this sent to?  [facility] [address] [place] [address] Phone: [phone] Fax: [phone]    Patient notified that their request is being sent to the clinical staff for review and that they should receive a response within 2 business days.   Please advise at Mobile [phone] (mobile)

## 2023-09-30 NOTE — Progress Notes (Signed)
Subjective:    Patient ID: [name], female    DOB: [birth year], 62 y.o.   MRN: [id number]  HPI  [name] is here in follow up of her chronic pain and gait deficits. She has had more pain in her low back and Dr. Yetta Barre' office performed what appears to have been an L4-L5 ESI? last month which has helped although now the pain is resurfacing. She will have another in January. They are also using oral steroids. Pain radiates from her low back into her right hip. She is going to PT at [facility] for therapeutic exercise, strengthening, gait, etc. She still wears an AFO for support.  Her new AFO seems to be getting her better support in general.  For pain currently she is taking tramadol every 6 hours as needed but usually only takes 1/day if that.  She is also on amitriptyline and gabapentin as well as Lyrica.  Cymbalta is 30 mg daily.  She feels that the pain and other stresses have been causing her blood pressure to go up.  She finds it hard to relax often.  [place]   Pain Inventory Average Pain 7 Pain Right Now 7 My pain is sharp, burning, tingling, and aching  In the last 24 hours, has pain interfered with the following? General activity 4 Relation with others 1 Enjoyment of life 4 What TIME of day is your pain at its worst? morning  and evening Sleep (in general) Fair  Pain is worse with: bending, standing, and some activites Pain improves with: rest, heat/ice, therapy/exercise, and medication Relief from Meds: 6  Family History  Problem Relation Age of Onset   Heart attack Mother    Lung cancer Father    Cancer Father    Pancreatic cancer Sister    Breast cancer Sister 27   Multiple myeloma Sister    Breast cancer Sister        diagnosed in her 80's   Heart attack Sister    Throat cancer Brother    Lung cancer Brother    Stomach cancer Cousin    Colon cancer Neg Hx    Colon polyps Neg Hx     Esophageal cancer Neg Hx    Rectal cancer Neg Hx    Social History   Socioeconomic History   Marital status: Married    Spouse name: Soil scientist   Number of children: 1   Years of education: Not on file   Highest education level: Not on file  Occupational History   Not on file  Tobacco Use   Smoking status: Never   Smokeless tobacco: Never  Vaping Use   Vaping status: Never Used  Substance and Sexual Activity   Alcohol use: Never   Drug use: Never   Sexual activity: Yes    Partners: Male    Birth control/protection: Post-menopausal, Surgical  Other Topics Concern   Not on file  Social History Narrative   Not on file   Social Determinants of Health   Financial Resource Strain: Low Risk  (09/29/2022)   Overall Financial Resource Strain (CARDIA)    Difficulty of Paying Living Expenses: Not hard at all  Food Insecurity: No Food Insecurity (09/29/2022)   Hunger Vital Sign    Worried About Running Out of Food in the Last Year: Never true    Ran Out of Food in the Last Year: Never true  Transportation Needs:  No Transportation Needs (09/29/2022)   PRAPARE - Administrator, Civil Service (Medical): No    Lack of Transportation (Non-Medical): No  Physical Activity: Inactive (09/29/2022)   Exercise Vital Sign    Days of Exercise per Week: 0 days    Minutes of Exercise per Session: 0 min  Stress: No Stress Concern Present (09/29/2022)   [employer] of Occupational Health - Occupational Stress Questionnaire    Feeling of Stress : Not at all  Social Connections: Socially Integrated (09/29/2022)   Social Connection and Isolation Panel [NHANES]    Frequency of Communication with Friends and Family: More than three times a week    Frequency of Social Gatherings with Friends and Family: More than three times a week    Attends Religious Services: More than 4 times per year    Active Member of [employer] or Organizations: Yes    Attends Engineer, structural: More  than 4 times per year    Marital Status: Married   Past Surgical History:  Procedure Laterality Date   ABDOMINAL HYSTERECTOMY     ANTERIOR CERVICAL DECOMP/DISCECTOMY FUSION N/A 01/03/2022   Procedure: Anterior Cervical Decompression Fusion - Cervical four-Cervical five - Cervical five-Cervical six;  Surgeon: Tia Alert, MD;  Location: [facility] OR;  Service: Neurosurgery;  Laterality: N/A;   BIOPSY  12/16/2020   Procedure: BIOPSY;  Surgeon: Meryl Dare, MD;  Location: [facility] ENDOSCOPY;  Service: Endoscopy;;   CARDIAC CATHETERIZATION N/A 04/18/2015   Procedure: Left Heart Cath and Coronary Angiography;  Surgeon: Rinaldo Cloud, MD;  Location: [facility] INVASIVE CV LAB;  Service: Cardiovascular;  Laterality: N/A;   COLONOSCOPY     COLONOSCOPY W/ BIOPSIES AND POLYPECTOMY  2018   ESOPHAGOGASTRODUODENOSCOPY (EGD) WITH PROPOFOL N/A 12/16/2020   Procedure: ESOPHAGOGASTRODUODENOSCOPY (EGD) WITH PROPOFOL;  Surgeon: Meryl Dare, MD;  Location: [facility] ENDOSCOPY;  Service: Endoscopy;  Laterality: N/A;   FOOT SURGERY Bilateral    [facility] "bunion,bone spur, tendon" (1) -6'16, (1)-10'16   HEMATOMA EVACUATION N/A 01/05/2022   Procedure: Cervical Wound Exploration;  Surgeon: Coletta Memos, MD;  Location: [facility] OR;  Service: Neurosurgery;  Laterality: N/A;   IR EPIDUROGRAPHY  07/21/2018   LUMBAR LAMINECTOMY/DECOMPRESSION MICRODISCECTOMY Bilateral 12/28/2015   Procedure: MICRO LUMBAR DECOMPRESSION L4 - L5 BILATERALLY;  Surgeon: Jene Every, MD;  Location: [facility] ORS;  Service: Orthopedics;  Laterality: Bilateral;   LUMBAR LAMINECTOMY/DECOMPRESSION MICRODISCECTOMY Bilateral 03/04/2018   Procedure: Revision of Microlumbar Decompression Bilateral Lumbar Four-Five;  Surgeon: Jene Every, MD;  Location: [facility] OR;  Service: Orthopedics;  Laterality: Bilateral;  90 mins   SAVORY DILATION N/A 12/16/2020   Procedure: SAVORY DILATION;  Surgeon: Meryl Dare, MD;  Location: [facility] ENDOSCOPY;  Service: Endoscopy;   Laterality: N/A;   SPINAL CORD STIMULATOR INSERTION N/A 09/28/2019   Procedure: THORACIC SPINAL CORD STIMULATOR INSERTION;  Surgeon: Venita Lick, MD;  Location: [facility] OR;  Service: Orthopedics;  Laterality: N/A;  2.5 hrs   SPINAL CORD STIMULATOR REMOVAL N/A 05/27/2021   Procedure: LUMBAR SPINAL CORD STIMULATOR REMOVAL;  Surgeon: Lucy Chris, MD;  Location: [facility] ORS;  Service: Neurosurgery;  Laterality: N/A;   TUBAL LIGATION     WISDOM TOOTH EXTRACTION     WOUND EXPLORATION N/A 03/04/2018   Procedure: EXPLORATION OF LUMBAR DECOMPRESSION WOUND;  Surgeon: Jene Every, MD;  Location: [facility] OR;  Service: Orthopedics;  Laterality: N/A;   Past Surgical History:  Procedure Laterality Date   ABDOMINAL HYSTERECTOMY     ANTERIOR CERVICAL DECOMP/DISCECTOMY FUSION N/A  01/03/2022   Procedure: Anterior Cervical Decompression Fusion - Cervical four-Cervical five - Cervical five-Cervical six;  Surgeon: Tia Alert, MD;  Location: [facility] OR;  Service: Neurosurgery;  Laterality: N/A;   BIOPSY  12/16/2020   Procedure: BIOPSY;  Surgeon: Meryl Dare, MD;  Location: [facility] ENDOSCOPY;  Service: Endoscopy;;   CARDIAC CATHETERIZATION N/A 04/18/2015   Procedure: Left Heart Cath and Coronary Angiography;  Surgeon: Rinaldo Cloud, MD;  Location: [facility] INVASIVE CV LAB;  Service: Cardiovascular;  Laterality: N/A;   COLONOSCOPY     COLONOSCOPY W/ BIOPSIES AND POLYPECTOMY  2018   ESOPHAGOGASTRODUODENOSCOPY (EGD) WITH PROPOFOL N/A 12/16/2020   Procedure: ESOPHAGOGASTRODUODENOSCOPY (EGD) WITH PROPOFOL;  Surgeon: Meryl Dare, MD;  Location: [facility] ENDOSCOPY;  Service: Endoscopy;  Laterality: N/A;   FOOT SURGERY Bilateral    [facility] "bunion,bone spur, tendon" (1) -6'16, (1)-10'16   HEMATOMA EVACUATION N/A 01/05/2022   Procedure: Cervical Wound Exploration;  Surgeon: Coletta Memos, MD;  Location: [facility] OR;  Service: Neurosurgery;  Laterality: N/A;   IR EPIDUROGRAPHY  07/21/2018   LUMBAR LAMINECTOMY/DECOMPRESSION  MICRODISCECTOMY Bilateral 12/28/2015   Procedure: MICRO LUMBAR DECOMPRESSION L4 - L5 BILATERALLY;  Surgeon: Jene Every, MD;  Location: [facility] ORS;  Service: Orthopedics;  Laterality: Bilateral;   LUMBAR LAMINECTOMY/DECOMPRESSION MICRODISCECTOMY Bilateral 03/04/2018   Procedure: Revision of Microlumbar Decompression Bilateral Lumbar Four-Five;  Surgeon: Jene Every, MD;  Location: [facility] OR;  Service: Orthopedics;  Laterality: Bilateral;  90 mins   SAVORY DILATION N/A 12/16/2020   Procedure: SAVORY DILATION;  Surgeon: Meryl Dare, MD;  Location: [facility] ENDOSCOPY;  Service: Endoscopy;  Laterality: N/A;   SPINAL CORD STIMULATOR INSERTION N/A 09/28/2019   Procedure: THORACIC SPINAL CORD STIMULATOR INSERTION;  Surgeon: Venita Lick, MD;  Location: [facility] OR;  Service: Orthopedics;  Laterality: N/A;  2.5 hrs   SPINAL CORD STIMULATOR REMOVAL N/A 05/27/2021   Procedure: LUMBAR SPINAL CORD STIMULATOR REMOVAL;  Surgeon: Lucy Chris, MD;  Location: [facility] ORS;  Service: Neurosurgery;  Laterality: N/A;   TUBAL LIGATION     WISDOM TOOTH EXTRACTION     WOUND EXPLORATION N/A 03/04/2018   Procedure: EXPLORATION OF LUMBAR DECOMPRESSION WOUND;  Surgeon: Jene Every, MD;  Location: [facility] OR;  Service: Orthopedics;  Laterality: N/A;   Past Medical History:  Diagnosis Date   Anxiety    Aortic atherosclerosis (HCC) 04/16/2021   Atypical angina (HCC)    Back pain    related to spinal stenosis and disc problem, radiates down left buttocks to leg., weakness occ.   Chest pain    a. 03/2015 Cath: nl cors; b. 03/2021 Cor CTA: Ca2+ = 0. Nl Cors.   Chronic pain syndrome    Dyspnea    GERD (gastroesophageal reflux disease)    Grade I diastolic dysfunction    Headache    Hyperlipidemia    Hypertension    Lumbar post-laminectomy syndrome    LVH (left ventricular hypertrophy) 12/15/2020   a. 11/2020 Echo: EF 65-70%, no rwma, sev asymm LVH with IVSd 1.9 cm. No LVOT obs @ rest. Gr1 DD. Triv MR.   PONV (postoperative nausea  and vomiting)    Pulmonary nodules    a. 03/2021 CT Chest: 3mm pulm nodules in bilat lower lobes. F/u 1 yr.   Right foot drop    Syncope    a. 11/2020 Zio: No significant arrhythmias.   Vaginal foreign object    "Uses Femring"   BP (!) 154/87   Pulse 85   Ht 5\' 5"  (1.651 m)  Wt 169 lb (76.7 kg)   SpO2 96%   BMI 28.12 kg/m   Opioid Risk Score:   Fall Risk Score:  `1  Depression screen [facility] 2/9     09/30/2023   11:18 AM 07/29/2023    9:50 AM 04/14/2023   10:05 AM 04/01/2023    2:25 PM 12/10/2022    1:40 PM 09/29/2022   11:37 AM 09/29/2022    9:01 AM  Depression screen PHQ 2/9  Decreased Interest 0 0 0 0 0 0 2  Down, Depressed, Hopeless 0 0 0 0 0 0 0  PHQ - 2 Score 0 0 0 0 0 0 2  Altered sleeping   1   0 1  Tired, decreased energy   1   0 0  Change in appetite   0   0 0  Feeling bad or failure about yourself    0   0 0  Trouble concentrating   0   0 0  Moving slowly or fidgety/restless   0   0 0  Suicidal thoughts   0   0 0  PHQ-9 Score   2   0 3  Difficult doing work/chores   Somewhat difficult   Not difficult at all Not difficult at all      Review of Systems  Musculoskeletal:  Positive for back pain and gait problem.  All other systems reviewed and are negative.     Objective:   Physical Exam General: No acute distress HEENT: NCAT, EOMI, oral membranes moist Cards: reg rate  Chest: normal effort Abdomen: Soft, NT, ND Skin: dry, intact Extremities: no edema Psych: pleasant and appropriate  Skin: intact.   Neuro: RLE remains 3/5 to 3+ to 4-/5-prox with knee extension --> blue rocker AFO in place over the right ankle. LLE 4/5 grossly.  Sensory loss distally in both legs per baseline upper extremities are 4+ to 5 out of 5 on the right and  4 out of 5 on the left with some pain inhibition.  Improved left arm movement.  RIght hand with improved sensation.  Improved clearance of the right leg during swing phase of gait.  Better knee control with gait today on the right  side.    Musculoskeletal: Low back generally tender to palpation and with forward flexion.  Right hand is tender at the thenar eminence particularly at the right carpometacarpal joint.      Assessment & Plan:  1. Post Lumbar Laminectomy / Decompression: Lumbar Radiculopathy:             -gabapentin continue at 300mg  tid.  She is also getting Ly[facility]             -tramadol  for breakthrough severe pain.   refilled #60 today  -Increase Cymbalta to 60 mg daily -blue rocker AFO helping.  Still needs to work on posture.      -NS is following her back -Continue with physical therapy.  Discussed the importance of posture and gait mechanics 2. left BPPV-improved             -discussed being aware of safety, intake, etc             -acclimation 3. Anxiety. PCP Following and prescribing Alprazolam.               -buspar, xanax prn per primary 4. Spasms: may continue robaxin 5. Orthotics:             -  continue AFO as above 6.  Cervical spondylosis with radiculopath at C6.                       -improved after C4-5, C5-6 ACDF 7. Right hand pain: + CTS,  -good results with release.  Not sure she needs to follow-up nerve conduction test of the hand however.              -moderate 1st carpo metarcarpal OA is likely source of her ongoing pain                         -voltaren gel                         -splint during day as rest.                 20  minutes of face to face patient care time were spent during this visit. All questions were encouraged and answered.  Follow up with me in 4 mos .

## 2023-09-30 NOTE — Patient Instructions (Signed)
ALWAYS FEEL FREE TO CALL OUR OFFICE WITH ANY PROBLEMS OR QUESTIONS [phone])  **PLEASE NOTE** ALL MEDICATION REFILL REQUESTS (INCLUDING CONTROLLED SUBSTANCES) NEED TO BE MADE AT LEAST 7 DAYS PRIOR TO REFILL BEING DUE. ANY REFILL REQUESTS INSIDE THAT TIME FRAME MAY RESULT IN DELAYS IN RECEIVING YOUR PRESCRIPTION.    TRY CHECKING OUT THE "[employer] APP" FOR MEDITATION/MINDFULNESS TECHNIQUES TO HELP WITH STRESS

## 2023-10-01 ENCOUNTER — Ambulatory Visit: Payer: PPO | Admitting: Physical Therapy

## 2023-10-01 ENCOUNTER — Other Ambulatory Visit (HOSPITAL_COMMUNITY): Payer: Self-pay

## 2023-10-01 DIAGNOSIS — M6281 Muscle weakness (generalized): Secondary | ICD-10-CM

## 2023-10-01 DIAGNOSIS — R2689 Other abnormalities of gait and mobility: Secondary | ICD-10-CM

## 2023-10-01 NOTE — Therapy (Signed)
OUTPATIENT PHYSICAL THERAPY NEURO TREATMENT NOTE       Patient Name: [name] MRN: [id number] DOB:November 20, 1960, 62 y.o., female Today's Date: 10/02/2023   PCP: [place], [name]., MD REFERRING PROVIDER: Ranelle Oyster, MD  END OF SESSION:  PT End of Session - 10/02/23 1710     Visit Number 29    Number of Visits 32    Date for PT Re-Evaluation 10/09/23    Authorization Type [employer]    Authorization Time Period 04-21-23 - 06-27-23;  06-25-23 - 08-27-23;  09-01-23 - 10-09-23    Progress Note Due on Visit 30    PT Start Time 0932    PT Stop Time 1016    PT Time Calculation (min) 44 min    Equipment Utilized During Treatment Gait belt    Activity Tolerance Patient limited by pain   limited by RLE weakness   Behavior During Therapy [facility] for tasks assessed/performed                                      Past Medical History:  Diagnosis Date   Anxiety    Aortic atherosclerosis (HCC) 04/16/2021   Atypical angina (HCC)    Back pain    related to spinal stenosis and disc problem, radiates down left buttocks to leg., weakness occ.   Chest pain    a. 03/2015 Cath: nl cors; b. 03/2021 Cor CTA: Ca2+ = 0. Nl Cors.   Chronic pain syndrome    Dyspnea    GERD (gastroesophageal reflux disease)    Grade I diastolic dysfunction    Headache    Hyperlipidemia    Hypertension    Lumbar post-laminectomy syndrome    LVH (left ventricular hypertrophy) 12/15/2020   a. 11/2020 Echo: EF 65-70%, no rwma, sev asymm LVH with IVSd 1.9 cm. No LVOT obs @ rest. Gr1 DD. Triv MR.   PONV (postoperative nausea and vomiting)    Pulmonary nodules    a. 03/2021 CT Chest: 3mm pulm nodules in bilat lower lobes. F/u 1 yr.   Right foot drop    Syncope    a. 11/2020 Zio: No significant arrhythmias.   Vaginal foreign object    "Uses Femring"   Past Surgical History:  Procedure Laterality Date   ABDOMINAL HYSTERECTOMY     ANTERIOR CERVICAL DECOMP/DISCECTOMY FUSION  N/A 01/03/2022   Procedure: Anterior Cervical Decompression Fusion - Cervical four-Cervical five - Cervical five-Cervical six;  Surgeon: Tia Alert, MD;  Location: [facility] OR;  Service: Neurosurgery;  Laterality: N/A;   BIOPSY  12/16/2020   Procedure: BIOPSY;  Surgeon: Meryl Dare, MD;  Location: [facility] ENDOSCOPY;  Service: Endoscopy;;   CARDIAC CATHETERIZATION N/A 04/18/2015   Procedure: Left Heart Cath and Coronary Angiography;  Surgeon: Rinaldo Cloud, MD;  Location: [facility] INVASIVE CV LAB;  Service: Cardiovascular;  Laterality: N/A;   COLONOSCOPY     COLONOSCOPY W/ BIOPSIES AND POLYPECTOMY  2018   ESOPHAGOGASTRODUODENOSCOPY (EGD) WITH PROPOFOL N/A 12/16/2020   Procedure: ESOPHAGOGASTRODUODENOSCOPY (EGD) WITH PROPOFOL;  Surgeon: Meryl Dare, MD;  Location: [facility] ENDOSCOPY;  Service: Endoscopy;  Laterality: N/A;   FOOT SURGERY Bilateral    [facility] "bunion,bone spur, tendon" (1) -6'16, (1)-10'16   HEMATOMA EVACUATION N/A 01/05/2022   Procedure: Cervical Wound Exploration;  Surgeon: Coletta Memos, MD;  Location: [facility] OR;  Service: Neurosurgery;  Laterality: N/A;   IR EPIDUROGRAPHY  07/21/2018  LUMBAR LAMINECTOMY/DECOMPRESSION MICRODISCECTOMY Bilateral 12/28/2015   Procedure: MICRO LUMBAR DECOMPRESSION L4 - L5 BILATERALLY;  Surgeon: Jene Every, MD;  Location: [facility] ORS;  Service: Orthopedics;  Laterality: Bilateral;   LUMBAR LAMINECTOMY/DECOMPRESSION MICRODISCECTOMY Bilateral 03/04/2018   Procedure: Revision of Microlumbar Decompression Bilateral Lumbar Four-Five;  Surgeon: Jene Every, MD;  Location: [facility] OR;  Service: Orthopedics;  Laterality: Bilateral;  90 mins   SAVORY DILATION N/A 12/16/2020   Procedure: SAVORY DILATION;  Surgeon: Meryl Dare, MD;  Location: [facility] ENDOSCOPY;  Service: Endoscopy;  Laterality: N/A;   SPINAL CORD STIMULATOR INSERTION N/A 09/28/2019   Procedure: THORACIC SPINAL CORD STIMULATOR INSERTION;  Surgeon: Venita Lick, MD;  Location: [facility] OR;  Service:  Orthopedics;  Laterality: N/A;  2.5 hrs   SPINAL CORD STIMULATOR REMOVAL N/A 05/27/2021   Procedure: LUMBAR SPINAL CORD STIMULATOR REMOVAL;  Surgeon: Lucy Chris, MD;  Location: [facility] ORS;  Service: Neurosurgery;  Laterality: N/A;   TUBAL LIGATION     WISDOM TOOTH EXTRACTION     WOUND EXPLORATION N/A 03/04/2018   Procedure: EXPLORATION OF LUMBAR DECOMPRESSION WOUND;  Surgeon: Jene Every, MD;  Location: [facility] OR;  Service: Orthopedics;  Laterality: N/A;   Patient Active Problem List   Diagnosis Date Noted   Pain in thumb joint with movement of right hand 08/12/2023   Localized primary osteoarthritis of carpometacarpal joint of right thumb 07/29/2023   Right foot drop 07/29/2023   Frequent falls 04/14/2023   Gastroesophageal reflux disease 04/14/2023   Right upper extremity numbness 09/29/2022   Stage 3a chronic kidney disease (HCC) 09/29/2022   Cervical spondylosis with radiculopathy 01/05/2022   S/P cervical spinal fusion 01/03/2022   Cervical disc disorder with radiculopathy of cervical region 11/27/2021   Uncontrolled pain 05/27/2021   Syncope 05/13/2021   Depression with anxiety 05/13/2021   Chronic diastolic CHF (congestive heart failure) (HCC) 05/13/2021   Chronic back pain 05/13/2021   Chest tightness 05/13/2021   Lower abdominal pain 05/13/2021   Syncope and collapse 01/01/2021   Abnormal echocardiogram 01/01/2021   BPPV (benign paroxysmal positional vertigo), left 12/26/2020   Odynophagia    Dysphagia 12/15/2020   Postural dizziness with presyncope 12/14/2020   Lumbar post-laminectomy syndrome    Chronic pain 09/28/2019   Lumbar spinal stenosis    Radicular pain    Gait disorder    Difficulty in urination 07/13/2018   Back pain 07/12/2018   Hyperlipidemia 04/16/2018   GAD (generalized anxiety disorder) 04/16/2018   AKI (acute kidney injury) (HCC)    Benign essential HTN    Major depression, recurrent (HCC)    Lumbar radiculopathy 03/09/2018   Paraparesis (HCC)  03/09/2018   Essential hypertension    Chronic nonintractable headache    Leukocytosis    Acute blood loss anemia    Postoperative pain    Generalized weakness    Spinal stenosis at L4-L5 level 03/04/2018   Spinal stenosis of lumbar region 12/28/2015   Chest pain 04/16/2015    ONSET DATE: Referral date 04-01-23  REFERRING DIAG: M54.16 (ICD-10-CM) - Lumbar radiculopathy  THERAPY DIAG:  Muscle weakness (generalized)  Other abnormalities of gait and mobility  Rationale for Evaluation and Treatment: Rehabilitation  SUBJECTIVE:  SUBJECTIVE STATEMENT:  Pt reports she saw Dr. Riley Kill yesterday - told her she needs to continue with PT to try to reduce her back pain.  Pt states she knows she can't do the leg press because she may feel in in her back.  Pt says she continues to have moderate to severe back pain - rates intensity 8/10 - but says she is not going to have surgery   Pt accompanied by: self  PERTINENT HISTORY: Frequent falls:  Spinal cord stimulator insertion on 09-28-19; stimulator removal on 05-27-21 with resultant LLE weakness;  decompression and laminectomy on 12/28/15 at L4-L5  with redo decompression surgery on May 9,2019, post-op paraparesis with surgery to remove small hematoma; 07/12/18 fall at work (involved rolling chair) with hospital admission, transfer to CIR with d/c home 08/03/18; HTN, Bil foot surgery; left ventricular hypertrophy, chronic pain syndrome, dyspnea, hypertrophic cardiomyopathy; cervical fusion March 2023;  had carpal tunnel surgery RUE in April 2024:  LVH  PAIN:  10-01-23 Are you having pain? Intensity: 8/10 in low back:   7/10 pain reported in Rt thenar eminence area of Rt hand Location:  low back, Rt hand Description: soreness in low back;  radiates down Rt lateral leg to  the knee:  burning sensation in Rt hand Duration; varies depending on activity or movement being performed - worse with bending over or pulling leg up to chest Aggravating factors - certain movements; unable to turn or rotate Alleviating factor - heating pad helps some; pain medication  PRECAUTIONS: Fall  WEIGHT BEARING RESTRICTIONS: No  FALLS: Has patient fallen in last 6 months? Yes. Number of falls 3  LIVING ENVIRONMENT: Lives with: lives with their spouse Lives in: House/apartment Stairs: Yes: External: 5 steps; on right going up Has following equipment at home: Single point cane, Wheelchair (manual), and L[place]strand crutch  PLOF: Independent with basic ADLs, Independent with household mobility with device, Independent with community mobility with device, and Independent with transfers  PATIENT GOALS: improve strength Rt leg  OBJECTIVE:   DIAGNOSTIC FINDINGS: IMPRESSION: 1. Interval anterior discectomy and fusion from C4 through C6 with improved patency of the spinal canal and only mild residual foraminal narrowing. 2. Stable mild spinal stenosis and mild to moderate left foraminal narrowing at C3-4. 3. Stable mild spinal stenosis and mild foraminal narrowing bilaterally at C6-7. Mild foraminal narrowing bilaterally at C7-T1. 4. No cord deformity or abnormal cord signal.  No acute findings. No recent MRI of lumbar region  LOWER EXTREMITY MMT:    MMT Right Eval Left Eval  Hip flexion 3+   Hip extension    Hip abduction    Hip adduction    Hip internal rotation    Hip external rotation    Knee flexion 3-   Knee extension 3-   Ankle dorsiflexion 2-   Ankle plantarflexion    Ankle inversion    Ankle eversion    (Blank rows = not tested)   Today's Treatment: 10-01-23  There were no vitals filed for this visit.    GAIT: Gait pattern: decreased step length- Right, decreased stance time- Right, decreased hip/knee flexion- Right, and decreased ankle dorsiflexion-  Right Distance walked: 40' x 2 reps (to & from Scifit from 1st mat table) Assistive device utilized: rollator  Level of assistance: SBA Comments: blue rocker AFO donned on RLE      05-07-23: Gait velocity: with [place] Walk on AFO - 1"18 secs with rollator = .42 ft/sec   With Toe off AFO 31.9 secs  with rollator = 1.03 ft/sec      THEREX:  Pelvic tilt - 3 sec hold x 10 reps Knee to chest - RLE and LLE 10 reps each leg Hip abduction in hooklying - bil. LE's 10 reps Knee to chest with min assist 10 reps each leg Seated hamstring stretch - RLE on floor Cross RLE over LLE - pt held for approx. 8 secs - reported some discomfort so this stretch was discontinued  SciFit level 2.0 x 6" with bil. Ue's and LE's - pt reported no back pain with this exercise  HEP - Medbridge  Access Code: [email or web address] URL: [email or web address] Date: 05/06/2023 Prepared by: Maebelle Munroe  Exercises - Beginner Bridge  - 1 x daily - 7 x weekly - 1 sets - 10 reps - 3 hold - Supine Heel Slide  - 1 x daily - 7 x weekly - 1 sets - 10 reps - Bent Knee Fallouts  - 1 x daily - 7 x weekly - 1 sets - 10 reps - Sidelying Hip Abduction  - 1 x daily - 7 x weekly - 1 sets - 10 reps - Seated Knee Flexion Extension AROM   - 1 x daily - 7 x weekly - 1 sets - 10 reps   PATIENT EDUCATION: Education details:  discussed STG's and progress achieved Person educated: Patient Education method: Explanation, demonstration, handouts  Education comprehension: verbalized understanding, demonstrated understanding  HOME EXERCISE PROGRAM: To be issued  GOALS: Goals reviewed with patient? Yes  SHORT TERM GOALS: Target date: 07-24-23   1.   Amb. 230' with RW with SBA without requiring seated rest period. Baseline:  Goal status: Goal met 07-21-23  2.  Perform sit to stand from mat without UE support to demo improved LE strength. Baseline:    Goal status:  Not met - ongoing - 07-21-23  3.  Modified independent step  negotiation with use of bil. Hand rails using step by step sequence. Baseline: Goal status: Goal met 07-21-23  4.   Improve TUG score from 22.25 secs to </= 19 secs with use of rollator.  Baseline:  22.25 secs with rollator & blue rocker AFO on 06-25-23;    07-21-23=  32.78, 22.62 secs with rollator Goal status: Not met - ongoing - 07-21-23  5.   Stand for 1" without UE support to demo improved standing balance. Baseline:  Goal status: Goal met 07-21-23    LONG TERM GOALS: Target date: 07-20-23  Amb. 230' with RW with SBA without requiring seated rest period. Baseline: 57' with RW Goal status: Partially met - 06-23-23  2.  Improve TUG score by at least 4 secs with use of RW for assistance with ambulation Baseline: TBA;  46 secs on 7-11;  35.97 secs on 06-16-23;  22.25 secs with rollator with blue rocker AFO Goal status: Goal met 06-25-23  3.  Perform 5x sit to stand transfers with LUE support only to demo improved strength in RLE.  Baseline:  Goal status: Goal met 05-29-23 - RUE used   4.  Modified independent step negotiation with use of bil. Hand rails using step by step sequence. Baseline:  Goal status: 06-23-23 -- SBA to supervision needed for safety  5.  Obtain new AFO for increased RLE stability to decrease fall risk with gait. Baseline:  Goal status: In progress - delivery scheduled for 06-24-23; Goal met 06-25-23  6.  Independent in updated HEP for RLE strengthening. Baseline:  Goal status: Goal met 06-16-23   NEW  LONG TERM GOALS:  Target date;  08-21-23    NEW TARGET DATE:  10-09-23     1.  Amb. 300' with RW with blue rocker AFO on RLE with SBA without requiring seated rest period.    Baseline;  180', 64' with rollator;  40' with rollator -- 09-01-23    Status:  In progress  2.  Improve TUG score from 22.25 secs to </= 15 secs with use of rollator.  REVISED:  score </= 19 secs  Baseline:  22.25 secs with rollator & blue rocker AFO on 06-25-23  Status:  In progress;  REVISED  3.  Pt will report ability to negotiate steps in her garage at home modified independently for safely entering & exiting home.  Baseline; Goal met 09-01-23  Status:  MET  4.  Increase gait velocity to >/= 1.5 ft/sec with use of AFO & rollator for increased gait efficiency.  Baseline:  With Toe off AFO 31.9 secs with rollator = 1.03 ft/sec    Status:  In progress - Not tested on 09-01-23 due to increased RLE weakness  5.  Independent in updated HEP for RLE strengthening.  Baseline:  Goal status: Goal met 08-25-23   REVISED LTG's:  TARGET DATE 10-09-23  1.  Amb. 300' with RW with blue rocker AFO on RLE with SBA without requiring seated rest period.    Baseline;  180', 37' with rollator;  46' with rollator -- 09-01-23    Status:  In progress  2.  Improve TUG score from 22.25 secs to </= 15 secs with use of rollator.  REVISED:  score </= 19 secs  Baseline:  22.25 secs with rollator & blue rocker AFO on 06-25-23  Status:  In progress; REVISED  3.  Pt will report ability to negotiate steps in her garage at home modified independently for safely entering & exiting home.  Baseline; Goal met 09-01-23  Status:  In progress  4.  Increase gait velocity to >/= 1.5 ft/sec with use of AFO & rollator for increased gait efficiency.  Baseline:  With Toe off AFO 31.9 secs with rollator = 1.03 ft/sec    Status:  In progress - Not tested on 09-01-23 due to increased RLE weakness  5.  Independent in updated HEP for RLE strengthening.  Baseline:  Goal status: Goal met 08-25-23 ASSESSMENT:  CLINICAL IMPRESSION: PT session focused on low back stretches and gentle AROM for bil. Hip flexors and abductors.  Pt able to perform recumbent bike exercise for 6" at end of session; pt reported minimal decrease in low back pain to 7/10 intensity at end of session.  Low back pain is significantly impacting pt's ability to tolerate/perform strengthening exercises for RLE.  Cont with POC.    OBJECTIVE  IMPAIRMENTS: Abnormal gait, decreased balance, decreased coordination, decreased endurance, decreased strength, and impaired tone.   ACTIVITY LIMITATIONS: carrying, bending, squatting, stairs, transfers, and locomotion level  PARTICIPATION LIMITATIONS: cleaning, laundry, shopping, and community activity  PERSONAL FACTORS: Behavior pattern, Past/current experiences, and Time since onset of injury/illness/exacerbation are also affecting patient's functional outcome.   REHAB POTENTIAL: Good  CLINICAL DECISION MAKING: Evolving/moderate complexity  EVALUATION COMPLEXITY: Moderate  PLAN:  PT FREQUENCY: 2x/week  PT DURATION: 4 weeks  PLANNED INTERVENTIONS: Therapeutic exercises, Therapeutic activity, Neuromuscular re-education, Balance training, Gait training, Patient/Family education, Self Care, Stair training, Orthotic/Fit training, DME instructions, and Aquatic Therapy  PLAN FOR NEXT SESSION:  continue RLE strengthening   Kary Kos, PT 10/02/2023, 5:27 PM

## 2023-10-02 ENCOUNTER — Other Ambulatory Visit (HOSPITAL_COMMUNITY): Payer: Self-pay

## 2023-10-02 ENCOUNTER — Ambulatory Visit: Payer: PPO

## 2023-10-02 ENCOUNTER — Encounter: Payer: Self-pay | Admitting: Physical Therapy

## 2023-10-02 VITALS — Ht 66.0 in | Wt 169.0 lb

## 2023-10-02 DIAGNOSIS — Z0000 Encounter for general adult medical examination without abnormal findings: Secondary | ICD-10-CM

## 2023-10-02 NOTE — Patient Instructions (Addendum)
[name] , Thank you for taking time to come for your Medicare Wellness Visit. I appreciate your ongoing commitment to your health goals. Please review the following plan we discussed and let me know if I can assist you in the future.   Referrals/Orders/Follow-Ups/Clinician Recommendations:   This is a list of the screening recommended for you and due dates:  Health Maintenance  Topic Date Due   Pap with HPV screening  Never done   COVID-19 Vaccine (7 - 2023-24 season) 06/28/2023   DTaP/Tdap/Td vaccine (4 - Td or Tdap) 06/09/2024   Medicare Annual Wellness Visit  10/01/2024   Mammogram  12/24/2024   Colon Cancer Screening  09/30/2029   Flu Shot  Completed   Hepatitis C Screening  Completed   HIV Screening  Completed   Zoster (Shingles) Vaccine  Completed   HPV Vaccine  Aged Out  Opioid Pain Medicine Management Opioids are powerful medicines that are used to treat moderate to severe pain. When used for short periods of time, they can help you to: Sleep better. Do better in physical or occupational therapy. Feel better in the first few days after an injury. Recover from surgery. Opioids should be taken with the supervision of a trained health care provider. They should be taken for the shortest period of time possible. This is because opioids can be addictive, and the longer you take opioids, the greater your risk of addiction. This addiction can also be called opioid use disorder. What are the risks? Using opioid pain medicines for longer than 3 days increases your risk of side effects. Side effects include: Constipation. Nausea and vomiting. Breathing difficulties (respiratory depression). Drowsiness. Confusion. Opioid use disorder. Itching. Taking opioid pain medicine for a long period of time can affect your ability to do daily tasks. It also puts you at risk for: Motor vehicle crashes. Depression. Suicide. Heart attack. Overdose, which can be life-threatening. What  is a pain treatment plan? A pain treatment plan is an agreement between you and your health care provider. Pain is unique to each person, and treatments vary depending on your condition. To manage your pain, you and your health care provider need to work together. To help you do this: Discuss the goals of your treatment, including how much pain you might expect to have and how you will manage the pain. Review the risks and benefits of taking opioid medicines. Remember that a good treatment plan uses more than one approach and minimizes the chance of side effects. Be honest about the amount of medicines you take and about any drug or alcohol use. Get pain medicine prescriptions from only one health care provider. Pain can be managed with many types of alternative treatments. Ask your health care provider to refer you to one or more specialists who can help you manage pain through: Physical or occupational therapy. Counseling (cognitive behavioral therapy). Good nutrition. Biofeedback. Massage. Meditation. Non-opioid medicine. Following a gentle exercise program. How to use opioid pain medicine Taking medicine Take your pain medicine exactly as told by your health care provider. Take it only when you need it. If your pain gets less severe, you may take less than your prescribed dose if your health care provider approves. If you are not having pain, do nottake pain medicine unless your health care provider tells you to take it. If your pain is severe, do nottry to treat it yourself by taking more pills than instructed on your prescription. Contact your health care provider for help. Write  down the times when you take your pain medicine. It is easy to become confused while on pain medicine. Writing the time can help you avoid overdose. Take other over-the-counter or prescription medicines only as told by your health care provider. Keeping yourself and others safe  While you are taking opioid  pain medicine: Do not drive, use machinery, or power tools. Do not sign legal documents. Do not drink alcohol. Do not take sleeping pills. Do not supervise children by yourself. Do not do activities that require climbing or being in high places. Do not go to a lake, river, ocean, spa, or swimming pool. Do not share your pain medicine with anyone. Keep pain medicine in a locked cabinet or in a secure area where pets and children cannot reach it. Stopping your use of opioids If you have been taking opioid medicine for more than a few weeks, you may need to slowly decrease (taper) how much you take until you stop completely. Tapering your use of opioids can decrease your risk of symptoms of withdrawal, such as: Pain and cramping in the abdomen. Nausea. Sweating. Sleepiness. Restlessness. Uncontrollable shaking (tremors). Cravings for the medicine. Do not attempt to taper your use of opioids on your own. Talk with your health care provider about how to do this. Your health care provider may prescribe a step-down schedule based on how much medicine you are taking and how long you have been taking it. Getting rid of leftover pills Do not save any leftover pills. Get rid of leftover pills safely by: Taking the medicine to a prescription take-back program. This is usually offered by the county or law enforcement. Bringing them to a pharmacy that has a drug disposal container. Flushing them down the toilet. Check the label or package insert of your medicine to see whether this is safe to do. Throwing them out in the trash. Check the label or package insert of your medicine to see whether this is safe to do. If it is safe to throw it out, remove the medicine from the original container, put it into a sealable bag or container, and mix it with used coffee grounds, food scraps, dirt, or cat litter before putting it in the trash. Follow these instructions at home: Activity Do exercises as told by  your health care provider. Avoid activities that make your pain worse. Return to your normal activities as told by your health care provider. Ask your health care provider what activities are safe for you. General instructions You may need to take these actions to prevent or treat constipation: Drink enough fluid to keep your urine pale yellow. Take over-the-counter or prescription medicines. Eat foods that are high in fiber, such as beans, whole grains, and fresh fruits and vegetables. Limit foods that are high in fat and processed sugars, such as fried or sweet foods. Keep all follow-up visits. This is important. Where to find support If you have been taking opioids for a long time, you may benefit from receiving support for quitting from a local support group or counselor. Ask your health care provider for a referral to these resources in your area. Where to find more information Centers for Disease Control and Prevention (CDC): FootballExhibition.com.br [place] Food and Drug Administration (FDA): PumpkinSearch.com.ee Get help right away if: You may have taken too much of an opioid (overdosed). Common symptoms of an overdose: Your breathing is slower or more shallow than normal. You have a very slow heartbeat (pulse). You have slurred speech. You have  nausea and vomiting. Your pupils become very small. You have other potential symptoms: You are very confused. You faint or feel like you will faint. You have cold, clammy skin. You have blue lips or fingernails. You have thoughts of harming yourself or harming others. These symptoms may represent a serious problem that is an emergency. Do not wait to see if the symptoms will go away. Get medical help right away. Call your local emergency services (911 in the [place] Do not drive yourself to the hospital.  If you ever feel like you may hurt yourself or others, or have thoughts about taking your own life, get help right away. Go to your nearest emergency department  or: Call your local emergency services (911 in the [place] Call the [facility] ((585)676-5567 in the [place] Call a suicide crisis helpline, such as the National Suicide Prevention Lifeline at (272)270-8382 or 988 in the [place] This is open 24 hours a day in the [place] Text the Crisis Text Line at 779-784-4209 (in the [place] Summary Opioid medicines can help you manage moderate to severe pain for a short period of time. A pain treatment plan is an agreement between you and your health care provider. Discuss the goals of your treatment, including how much pain you might expect to have and how you will manage the pain. If you think that you or someone else may have taken too much of an opioid, get medical help right away. This information is not intended to replace advice given to you by your health care provider. Make sure you discuss any questions you have with your health care provider. Document Revised: 05/08/2021 Document Reviewed: 01/23/2021 [employer] Patient Education  2024 [employer]   Advanced directives: (Declined) Advance directive discussed with you today. Even though you declined this today, please call our office should you change your mind, and we can give you the proper paperwork for you to fill out.  Next Medicare Annual Wellness Visit scheduled for next year: Yes

## 2023-10-02 NOTE — Progress Notes (Signed)
Subjective:   [name] is a 62 y.o. female who presents for Medicare Annual (Subsequent) preventive examination.  Visit Complete: Virtual I connected with  [name] on 10/02/23 by a audio enabled telemedicine application and verified that I am speaking with the correct person using two identifiers.  Patient Location: Home  Provider Location: Home Office  I discussed the limitations of evaluation and management by telemedicine. The patient expressed understanding and agreed to proceed.  Vital Signs: Because this visit was a virtual/telehealth visit, some criteria may be missing or patient reported. Any vitals not documented were not able to be obtained and vitals that have been documented are patient reported.   Cardiac Risk Factors include: advanced age (>73men, >54 women);hypertension     Objective:    Today's Vitals   10/02/23 1056 10/02/23 1057  Weight: 169 lb (76.7 kg)   Height: 5\' 6"  (1.676 m)   PainSc:  7    Body mass index is 27.28 kg/m.     10/02/2023   11:08 AM 04/22/2023    3:19 PM 09/29/2022   11:41 AM 12/31/2021    9:08 AM 09/12/2021   11:43 AM 07/12/2021    4:17 PM 05/28/2021    5:00 PM  Advanced Directives  Does Patient Have a Medical Advance Directive? No Yes No No No No No  Type of Advance Directive  Living will       Does patient want to make changes to medical advance directive?  No - Patient declined       Would patient like information on creating a medical advance directive? No - Patient declined  No - Patient declined No - Patient declined No - Patient declined No - Patient declined No - Patient declined    Current Medications (verified) Outpatient Encounter Medications as of 10/02/2023  Medication Sig   acetaminophen (TYLENOL) 500 MG tablet Take 1,000 mg by mouth every 6 (six) hours as needed for headache.   ALPRAZolam (XANAX) 0.5 MG tablet Take 1/2 - 1 tablet by mouth at bedtime as needed for anxiety.   amitriptyline  (ELAVIL) 25 MG tablet Take 1 tablet (25 mg total) by mouth daily.   amLODipine (NORVASC) 5 MG tablet Take 1 tablet (5 mg total) by mouth daily.   atorvastatin (LIPITOR) 20 MG tablet Take 1 tablet (20 mg total) by mouth daily.   baclofen (LIORESAL) 10 MG tablet Take 1 tablet by mouth 3 (three) times daily as needed.   busPIRone (BUSPAR) 5 MG tablet Take 1 tablet by mouth 2  times daily. (Patient taking differently: Take 5 mg by mouth 2 (two) times daily as needed (anxiety).)   carvedilol (COREG) 25 MG tablet Take 1 tablet (25 mg total) by mouth 2 (two) times daily with a meal.   cholecalciferol (VITAMIN D3) 25 MCG (1000 UNIT) tablet Take 1,000 Units by mouth daily.   clotrimazole-betamethasone (LOTRISONE) cream APPLY TO THE AFFECTED AND SURROUNDING AREAS OF SKIN BY TOPICAL ROUTE 2 TIMES PER DAY IN THE MORNING AND EVENING FOR 2 WEEKS   diazepam (VALIUM) 5 MG tablet Take 1 tablet (5 mg total) by mouth  30 minutes prior to MRI as directed.   diphenhydrAMINE (BENADRYL) 25 MG tablet Take 25 mg by mouth daily as needed for itching.   DULoxetine (CYMBALTA) 30 MG capsule Take 2 capsules (60 mg total) by mouth daily.   Estradiol Acetate (FEMRING) 0.05 MG/24HR RING USE ONE RING VAGINALLY EVERY 3 MONTHS AS DIRECTED   gabapentin (NEURONTIN) 600  MG tablet Take 1 tablet (600 mg total) by mouth 3 (three) times daily.   hydrALAZINE (APRESOLINE) 50 MG tablet Take 1 tablet (50 mg total) by mouth in the morning AND 1 tablet (50 mg total) at bedtime.   meclizine (ANTIVERT) 25 MG tablet Take 25 mg by mouth 3 (three) times daily as needed for dizziness.   methocarbamol (ROBAXIN) 500 MG tablet Take 1 tablet (500 mg total) by mouth 3 (three) times daily as needed for muscle spasm.   methylPREDNISolone (MEDROL DOSEPAK) 4 MG TBPK tablet Take as directed   ondansetron (ZOFRAN) 4 MG tablet Take 1 tablet (4 mg tablet) 30 - 60 minutes each prep dose   pregabalin (LYRICA) 300 MG capsule Take 1 capsule (300 mg total) by mouth 2  (two) times daily.   spironolactone (ALDACTONE) 50 MG tablet Take 1 tablet (50 mg total) by mouth daily.   telmisartan (MICARDIS) 40 MG tablet    traMADol (ULTRAM) 50 MG tablet Take 1 tablet (50 mg total) by mouth every 6 (six) hours as needed.   [DISCONTINUED] omeprazole (PRILOSEC) 40 MG capsule Take 1 capsule (40 mg total) by mouth daily.   No facility-administered encounter medications on file as of 10/02/2023.    Allergies (verified) Cephalosporins; Penicillins; Anesthetics, amide; Betadine [povidone iodine]; Latex; Peach [prunus persica]; and Claritin [loratadine]   History: Past Medical History:  Diagnosis Date   Anxiety    Aortic atherosclerosis (HCC) 04/16/2021   Atypical angina (HCC)    Back pain    related to spinal stenosis and disc problem, radiates down left buttocks to leg., weakness occ.   Chest pain    a. 03/2015 Cath: nl cors; b. 03/2021 Cor CTA: Ca2+ = 0. Nl Cors.   Chronic pain syndrome    Dyspnea    GERD (gastroesophageal reflux disease)    Grade I diastolic dysfunction    Headache    Hyperlipidemia    Hypertension    Lumbar post-laminectomy syndrome    LVH (left ventricular hypertrophy) 12/15/2020   a. 11/2020 Echo: EF 65-70%, no rwma, sev asymm LVH with IVSd 1.9 cm. No LVOT obs @ rest. Gr1 DD. Triv MR.   PONV (postoperative nausea and vomiting)    Pulmonary nodules    a. 03/2021 CT Chest: 3mm pulm nodules in bilat lower lobes. F/u 1 yr.   Right foot drop    Syncope    a. 11/2020 Zio: No significant arrhythmias.   Vaginal foreign object    "Uses Femring"   Past Surgical History:  Procedure Laterality Date   ABDOMINAL HYSTERECTOMY     ANTERIOR CERVICAL DECOMP/DISCECTOMY FUSION N/A 01/03/2022   Procedure: Anterior Cervical Decompression Fusion - Cervical four-Cervical five - Cervical five-Cervical six;  Surgeon: Tia Alert, MD;  Location: [facility] OR;  Service: Neurosurgery;  Laterality: N/A;   BIOPSY  12/16/2020   Procedure: BIOPSY;  Surgeon: Meryl Dare, MD;  Location: [facility] ENDOSCOPY;  Service: Endoscopy;;   CARDIAC CATHETERIZATION N/A 04/18/2015   Procedure: Left Heart Cath and Coronary Angiography;  Surgeon: Rinaldo Cloud, MD;  Location: [facility] INVASIVE CV LAB;  Service: Cardiovascular;  Laterality: N/A;   COLONOSCOPY     COLONOSCOPY W/ BIOPSIES AND POLYPECTOMY  2018   ESOPHAGOGASTRODUODENOSCOPY (EGD) WITH PROPOFOL N/A 12/16/2020   Procedure: ESOPHAGOGASTRODUODENOSCOPY (EGD) WITH PROPOFOL;  Surgeon: Meryl Dare, MD;  Location: [facility] ENDOSCOPY;  Service: Endoscopy;  Laterality: N/A;   FOOT SURGERY Bilateral    [facility] "bunion,bone spur, tendon" (1) -6'16, (1)-10'16  HEMATOMA EVACUATION N/A 01/05/2022   Procedure: Cervical Wound Exploration;  Surgeon: Coletta Memos, MD;  Location: [facility] OR;  Service: Neurosurgery;  Laterality: N/A;   IR EPIDUROGRAPHY  07/21/2018   LUMBAR LAMINECTOMY/DECOMPRESSION MICRODISCECTOMY Bilateral 12/28/2015   Procedure: MICRO LUMBAR DECOMPRESSION L4 - L5 BILATERALLY;  Surgeon: Jene Every, MD;  Location: [facility] ORS;  Service: Orthopedics;  Laterality: Bilateral;   LUMBAR LAMINECTOMY/DECOMPRESSION MICRODISCECTOMY Bilateral 03/04/2018   Procedure: Revision of Microlumbar Decompression Bilateral Lumbar Four-Five;  Surgeon: Jene Every, MD;  Location: [facility] OR;  Service: Orthopedics;  Laterality: Bilateral;  90 mins   SAVORY DILATION N/A 12/16/2020   Procedure: SAVORY DILATION;  Surgeon: Meryl Dare, MD;  Location: [facility] ENDOSCOPY;  Service: Endoscopy;  Laterality: N/A;   SPINAL CORD STIMULATOR INSERTION N/A 09/28/2019   Procedure: THORACIC SPINAL CORD STIMULATOR INSERTION;  Surgeon: Venita Lick, MD;  Location: [facility] OR;  Service: Orthopedics;  Laterality: N/A;  2.5 hrs   SPINAL CORD STIMULATOR REMOVAL N/A 05/27/2021   Procedure: LUMBAR SPINAL CORD STIMULATOR REMOVAL;  Surgeon: Lucy Chris, MD;  Location: [facility] ORS;  Service: Neurosurgery;  Laterality: N/A;   TUBAL LIGATION     WISDOM TOOTH EXTRACTION     WOUND  EXPLORATION N/A 03/04/2018   Procedure: EXPLORATION OF LUMBAR DECOMPRESSION WOUND;  Surgeon: Jene Every, MD;  Location: [facility] OR;  Service: Orthopedics;  Laterality: N/A;   Family History  Problem Relation Age of Onset   Heart attack Mother    Lung cancer Father    Cancer Father    Pancreatic cancer Sister    Breast cancer Sister 88   Multiple myeloma Sister    Breast cancer Sister        diagnosed in her 30's   Heart attack Sister    Throat cancer Brother    Lung cancer Brother    Stomach cancer Cousin    Colon cancer Neg Hx    Colon polyps Neg Hx    Esophageal cancer Neg Hx    Rectal cancer Neg Hx    Social History   Socioeconomic History   Marital status: Married    Spouse name: Soil scientist   Number of children: 1   Years of education: Not on file   Highest education level: Not on file  Occupational History   Not on file  Tobacco Use   Smoking status: Never   Smokeless tobacco: Never  Vaping Use   Vaping status: Never Used  Substance and Sexual Activity   Alcohol use: Never   Drug use: Never   Sexual activity: Yes    Partners: Male    Birth control/protection: Post-menopausal, Surgical  Other Topics Concern   Not on file  Social History Narrative   Not on file   Social Determinants of Health   Financial Resource Strain: Low Risk  (10/02/2023)   Overall Financial Resource Strain (CARDIA)    Difficulty of Paying Living Expenses: Not hard at all  Food Insecurity: No Food Insecurity (10/02/2023)   Hunger Vital Sign    Worried About Running Out of Food in the Last Year: Never true    Ran Out of Food in the Last Year: Never true  Transportation Needs: No Transportation Needs (10/02/2023)   PRAPARE - Administrator, Civil Service (Medical): No    Lack of Transportation (Non-Medical): No  Physical Activity: Insufficiently Active (10/02/2023)   Exercise Vital Sign    Days of Exercise per Week: 2 days    Minutes of Exercise per Session: 60  min   Stress: No Stress Concern Present (10/02/2023)   [employer] of Occupational Health - Occupational Stress Questionnaire    Feeling of Stress : Not at all  Social Connections: Socially Integrated (10/02/2023)   Social Connection and Isolation Panel [NHANES]    Frequency of Communication with Friends and Family: More than three times a week    Frequency of Social Gatherings with Friends and Family: More than three times a week    Attends Religious Services: More than 4 times per year    Active Member of [employer] or Organizations: Yes    Attends Engineer, structural: More than 4 times per year    Marital Status: Married    Tobacco Counseling Counseling given: Not Answered   Clinical Intake:  Pre-visit preparation completed: Yes  Pain : 0-10 Pain Score: 7  Pain Type: Chronic pain Pain Location: Back Pain Orientation: Lower Pain Radiating Towards: Rt hip area Pain Descriptors / Indicators: Constant Pain Onset: Other (comment) (Followed by medical attention) Pain Frequency: Constant Pain Relieving Factors: Rx Meds Effect of Pain on Daily Activities: Effects Daily Activies  Pain Relieving Factors: Rx Meds  BMI - recorded: 27.28 Nutritional Status: BMI 25 -29 Overweight Nutritional Risks: None Diabetes: No  How often do you need to have someone help you when you read instructions, pamphlets, or other written materials from your doctor or pharmacy?: 1 - Never  Interpreter Needed?: No  Information entered by :: Theresa Mulligan LPN   Activities of Daily Living    10/02/2023   11:07 AM  In your present state of health, do you have any difficulty performing the following activities:  Hearing? 0  Vision? 0  Difficulty concentrating or making decisions? 0  Walking or climbing stairs? 1  Comment Uses [employer] or bathing? 0  Doing errands, shopping? Heritage manager and eating ? N  Using the Toilet? N  In the past six months, have  you accidently leaked urine? N  Do you have problems with loss of bowel control? N  Managing your Medications? N  Managing your Finances? N  Housekeeping or managing your Housekeeping? N    Patient Care Team: [place], [name], MD as PCP - General (Family Medicine) Rennis Golden Lisette Abu, MD as PCP - Cardiology (Cardiology) Jene Every, MD as Consulting Physician (Orthopedic Surgery) Verner Chol, [facility] (Inactive) as Pharmacist (Pharmacist)  Indicate any recent Medical Services you may have received from other than [facility] providers in the past year (date may be approximate).     Assessment:   This is a routine wellness examination for [name].  Hearing/Vision screen Hearing Screening - Comments:: Denies hearing difficulties   Vision Screening - Comments:: Wears rx glasses - up to date with routine eye exams with  Dr Nile Riggs   Goals Addressed               This Visit's Progress     Increase physical activity (pt-stated)         Depression Screen    10/02/2023   11:05 AM 09/30/2023   11:18 AM 07/29/2023    9:50 AM 04/14/2023   10:05 AM 04/01/2023    2:25 PM 12/10/2022    1:40 PM 09/29/2022   11:37 AM  PHQ 2/9 Scores  PHQ - 2 Score 0 0 0 0 0 0 0  PHQ- 9 Score    2   0    Fall Risk    10/02/2023  11:08 AM 09/30/2023   11:18 AM 07/29/2023    9:50 AM 04/14/2023   10:07 AM 04/01/2023    2:25 PM  Fall Risk   Falls in the past year? 1 1 1 1  0  Number falls in past yr: 0 1 1 1  0  Injury with Fall? 0 0 0 1 0  Risk for fall due to : No Fall Risks Impaired balance/gait Impaired balance/gait History of fall(s);Impaired balance/gait   Follow up Falls prevention discussed   Falls evaluation completed     MEDICARE RISK AT HOME: Medicare Risk at Home Any stairs in or around the home?: Yes If so, are there any without handrails?: No Home free of loose throw rugs in walkways, pet beds, electrical cords, etc?: Yes Adequate lighting in your home to reduce risk of falls?: Yes Life  alert?: No Use of a cane, walker or w/c?: Yes Grab bars in the bathroom?: No Shower chair or bench in shower?: Yes Elevated toilet seat or a handicapped toilet?: Yes  TIMED UP AND GO:  Was the test performed?  No    Cognitive Function:        10/02/2023   11:09 AM 09/29/2022   11:41 AM 09/12/2021   11:40 AM  6CIT Screen  What Year? 0 points 0 points 0 points  What month? 0 points 0 points 0 points  What time? 0 points 0 points 0 points  Count back from 20 0 points 0 points 0 points  Months in reverse 0 points 0 points 0 points  Repeat phrase 0 points 0 points   Total Score 0 points 0 points     Immunizations Immunization History  Administered Date(s) Administered   Influenza, Seasonal, Injecte, Preservative Fre 07/10/2023   Influenza,inj,Quad PF,6+ Mos 08/03/2018, 08/09/2019, 08/20/2020, 07/04/2021, 07/21/2022   Influenza,inj,quad, With Preservative 07/27/2017   Influenza-Unspecified 08/07/2021   PFIZER(Purple Top)SARS-COV-2 Vaccination 12/14/2019, 01/05/2020, 07/24/2020, 02/15/2021   Pfizer Covid-19 Vaccine Bivalent Booster 41yrs & up 07/30/2022   Td 10/28/2007   Tdap 10/27/2012, 06/09/2014   Unspecified SARS-COV-2 Vaccination 08/28/2021   Zoster Recombinant(Shingrix) 08/27/2021, 03/12/2022    TDAP status: Up to date  Flu Vaccine status: Up to date    Covid-19 vaccine status: Declined, Education has been provided regarding the importance of this vaccine but patient still declined. Advised may receive this vaccine at local pharmacy or Health Dept.or vaccine clinic. Aware to provide a copy of the vaccination record if obtained from local pharmacy or Health Dept. Verbalized acceptance and understanding.  Qualifies for Shingles Vaccine? Yes   Zostavax completed Yes   Shingrix Completed?: Yes  Screening Tests Health Maintenance  Topic Date Due   Cervical Cancer Screening (HPV/Pap Cotest)  Never done   COVID-19 Vaccine (7 - 2023-24 season) 06/28/2023    DTaP/Tdap/Td (4 - Td or Tdap) 06/09/2024   Medicare Annual Wellness (AWV)  10/01/2024   MAMMOGRAM  12/24/2024   Colonoscopy  09/30/2029   INFLUENZA VACCINE  Completed   Hepatitis C Screening  Completed   HIV Screening  Completed   Zoster Vaccines- Shingrix  Completed   HPV VACCINES  Aged Out    Health Maintenance  Health Maintenance Due  Topic Date Due   Cervical Cancer Screening (HPV/Pap Cotest)  Never done   COVID-19 Vaccine (7 - 2023-24 season) 06/28/2023    Colorectal cancer screening: Type of screening: Colonoscopy. Completed 09/30/22. Repeat every 7 years  Mammogram status: Completed 12/24/22. Repeat every year      Additional Screening:  Hepatitis  C Screening: does qualify; Completed 04/20/20  Vision Screening: Recommended annual ophthalmology exams for early detection of glaucoma and other disorders of the eye. Is the patient up to date with their annual eye exam?  Yes  Who is the provider or what is the name of the office in which the patient attends annual eye exams? Dr Nile Riggs If pt is not established with a provider, would they like to be referred to a provider to establish care? No .   Dental Screening: Recommended annual dental exams for proper oral hygiene   Community Resource Referral / Chronic Care Management:  CRR required this visit?  No   CCM required this visit?  No     Plan:     I have personally reviewed and noted the following in the patient's chart:   Medical and social history Use of alcohol, tobacco or illicit drugs  Current medications and supplements including opioid prescriptions. Patient is currently taking Opioids Functional ability and status Nutritional status Physical activity Advanced directives List of other physicians Hospitalizations, surgeries, and ER visits in previous 12 months Vitals Screenings to include cognitive, depression, and falls Referrals and appointments  In addition, I have reviewed and discussed with  patient certain preventive protocols, quality metrics, and best practice recommendations. A written personalized care plan for preventive services as well as general preventive health recommendations were provided to patient.     Tillie Rung, LPN   12/01/8525   After Visit Summary: (MyChart) Due to this being a telephonic visit, the after visit summary with patients personalized plan was offered to patient via MyChart   Nurse Notes: None

## 2023-10-03 ENCOUNTER — Other Ambulatory Visit (HOSPITAL_COMMUNITY): Payer: Self-pay

## 2023-10-05 ENCOUNTER — Ambulatory Visit: Payer: PPO | Admitting: Physical Therapy

## 2023-10-05 ENCOUNTER — Other Ambulatory Visit (HOSPITAL_COMMUNITY): Payer: Self-pay

## 2023-10-05 DIAGNOSIS — R2689 Other abnormalities of gait and mobility: Secondary | ICD-10-CM | POA: Diagnosis not present

## 2023-10-05 DIAGNOSIS — M6281 Muscle weakness (generalized): Secondary | ICD-10-CM

## 2023-10-05 NOTE — Therapy (Unsigned)
OUTPATIENT PHYSICAL THERAPY NEURO TREATMENT NOTE/ PROGRESS NOTE     Progress Note   Reporting Period 08-12-23 to 10-05-23  See note below for Objective Data and Assessment of Progress/Goals.      Patient Name: [name] MRN: [id number] DOB:June 30, 1961, 62 y.o., female Today's Date: 10/06/2023   PCP: [place], [name]., MD REFERRING PROVIDER: Ranelle Oyster, MD  END OF SESSION:  PT End of Session - 10/06/23 1932     Visit Number 30    Number of Visits 38    Date for PT Re-Evaluation 11/13/23    Authorization Type [employer]    Authorization Time Period 04-21-23 - 06-27-23;  06-25-23 - 08-27-23;  09-01-23 - 10-09-23    Progress Note Due on Visit 30    PT Start Time 0933    PT Stop Time 1015    PT Time Calculation (min) 42 min    Equipment Utilized During Treatment Gait belt    Activity Tolerance Patient limited by pain   limited by RLE weakness   Behavior During Therapy [facility] for tasks assessed/performed                                       Past Medical History:  Diagnosis Date   Anxiety    Aortic atherosclerosis (HCC) 04/16/2021   Atypical angina (HCC)    Back pain    related to spinal stenosis and disc problem, radiates down left buttocks to leg., weakness occ.   Chest pain    a. 03/2015 Cath: nl cors; b. 03/2021 Cor CTA: Ca2+ = 0. Nl Cors.   Chronic pain syndrome    Dyspnea    GERD (gastroesophageal reflux disease)    Grade I diastolic dysfunction    Headache    Hyperlipidemia    Hypertension    Lumbar post-laminectomy syndrome    LVH (left ventricular hypertrophy) 12/15/2020   a. 11/2020 Echo: EF 65-70%, no rwma, sev asymm LVH with IVSd 1.9 cm. No LVOT obs @ rest. Gr1 DD. Triv MR.   PONV (postoperative nausea and vomiting)    Pulmonary nodules    a. 03/2021 CT Chest: 3mm pulm nodules in bilat lower lobes. F/u 1 yr.   Right foot drop    Syncope    a. 11/2020 Zio: No significant arrhythmias.   Vaginal foreign object     "Uses Femring"   Past Surgical History:  Procedure Laterality Date   ABDOMINAL HYSTERECTOMY     ANTERIOR CERVICAL DECOMP/DISCECTOMY FUSION N/A 01/03/2022   Procedure: Anterior Cervical Decompression Fusion - Cervical four-Cervical five - Cervical five-Cervical six;  Surgeon: Tia Alert, MD;  Location: [facility] OR;  Service: Neurosurgery;  Laterality: N/A;   BIOPSY  12/16/2020   Procedure: BIOPSY;  Surgeon: Meryl Dare, MD;  Location: [facility] ENDOSCOPY;  Service: Endoscopy;;   CARDIAC CATHETERIZATION N/A 04/18/2015   Procedure: Left Heart Cath and Coronary Angiography;  Surgeon: Rinaldo Cloud, MD;  Location: [facility] INVASIVE CV LAB;  Service: Cardiovascular;  Laterality: N/A;   COLONOSCOPY     COLONOSCOPY W/ BIOPSIES AND POLYPECTOMY  2018   ESOPHAGOGASTRODUODENOSCOPY (EGD) WITH PROPOFOL N/A 12/16/2020   Procedure: ESOPHAGOGASTRODUODENOSCOPY (EGD) WITH PROPOFOL;  Surgeon: Meryl Dare, MD;  Location: [facility] ENDOSCOPY;  Service: Endoscopy;  Laterality: N/A;   FOOT SURGERY Bilateral    [facility] "bunion,bone spur, tendon" (1) -6'16, (1)-10'16   HEMATOMA EVACUATION N/A 01/05/2022  Procedure: Cervical Wound Exploration;  Surgeon: Coletta Memos, MD;  Location: [facility] OR;  Service: Neurosurgery;  Laterality: N/A;   IR EPIDUROGRAPHY  07/21/2018   LUMBAR LAMINECTOMY/DECOMPRESSION MICRODISCECTOMY Bilateral 12/28/2015   Procedure: MICRO LUMBAR DECOMPRESSION L4 - L5 BILATERALLY;  Surgeon: Jene Every, MD;  Location: [facility] ORS;  Service: Orthopedics;  Laterality: Bilateral;   LUMBAR LAMINECTOMY/DECOMPRESSION MICRODISCECTOMY Bilateral 03/04/2018   Procedure: Revision of Microlumbar Decompression Bilateral Lumbar Four-Five;  Surgeon: Jene Every, MD;  Location: [facility] OR;  Service: Orthopedics;  Laterality: Bilateral;  90 mins   SAVORY DILATION N/A 12/16/2020   Procedure: SAVORY DILATION;  Surgeon: Meryl Dare, MD;  Location: [facility] ENDOSCOPY;  Service: Endoscopy;  Laterality: N/A;   SPINAL CORD  STIMULATOR INSERTION N/A 09/28/2019   Procedure: THORACIC SPINAL CORD STIMULATOR INSERTION;  Surgeon: Venita Lick, MD;  Location: [facility] OR;  Service: Orthopedics;  Laterality: N/A;  2.5 hrs   SPINAL CORD STIMULATOR REMOVAL N/A 05/27/2021   Procedure: LUMBAR SPINAL CORD STIMULATOR REMOVAL;  Surgeon: Lucy Chris, MD;  Location: [facility] ORS;  Service: Neurosurgery;  Laterality: N/A;   TUBAL LIGATION     WISDOM TOOTH EXTRACTION     WOUND EXPLORATION N/A 03/04/2018   Procedure: EXPLORATION OF LUMBAR DECOMPRESSION WOUND;  Surgeon: Jene Every, MD;  Location: [facility] OR;  Service: Orthopedics;  Laterality: N/A;   Patient Active Problem List   Diagnosis Date Noted   Pain in thumb joint with movement of right hand 08/12/2023   Localized primary osteoarthritis of carpometacarpal joint of right thumb 07/29/2023   Right foot drop 07/29/2023   Frequent falls 04/14/2023   Gastroesophageal reflux disease 04/14/2023   Right upper extremity numbness 09/29/2022   Stage 3a chronic kidney disease (HCC) 09/29/2022   Cervical spondylosis with radiculopathy 01/05/2022   S/P cervical spinal fusion 01/03/2022   Cervical disc disorder with radiculopathy of cervical region 11/27/2021   Uncontrolled pain 05/27/2021   Syncope 05/13/2021   Depression with anxiety 05/13/2021   Chronic diastolic CHF (congestive heart failure) (HCC) 05/13/2021   Chronic back pain 05/13/2021   Chest tightness 05/13/2021   Lower abdominal pain 05/13/2021   Syncope and collapse 01/01/2021   Abnormal echocardiogram 01/01/2021   BPPV (benign paroxysmal positional vertigo), left 12/26/2020   Odynophagia    Dysphagia 12/15/2020   Postural dizziness with presyncope 12/14/2020   Lumbar post-laminectomy syndrome    Chronic pain 09/28/2019   Lumbar spinal stenosis    Radicular pain    Gait disorder    Difficulty in urination 07/13/2018   Back pain 07/12/2018   Hyperlipidemia 04/16/2018   GAD (generalized anxiety disorder) 04/16/2018    AKI (acute kidney injury) (HCC)    Benign essential HTN    Major depression, recurrent (HCC)    Lumbar radiculopathy 03/09/2018   Paraparesis (HCC) 03/09/2018   Essential hypertension    Chronic nonintractable headache    Leukocytosis    Acute blood loss anemia    Postoperative pain    Generalized weakness    Spinal stenosis at L4-L5 level 03/04/2018   Spinal stenosis of lumbar region 12/28/2015   Chest pain 04/16/2015    ONSET DATE: Referral date 04-01-23  REFERRING DIAG: M54.16 (ICD-10-CM) - Lumbar radiculopathy  THERAPY DIAG:  Muscle weakness (generalized)  Other abnormalities of gait and mobility  Rationale for Evaluation and Treatment: Rehabilitation  SUBJECTIVE:  SUBJECTIVE STATEMENT:  Pt reports she continues to have significant back pain - lied down most of the day yesterday (Sunday) and kept hot pack on her back because it helped to relieve some of the pain  Pt accompanied by: self  PERTINENT HISTORY: Frequent falls:  Spinal cord stimulator insertion on 09-28-19; stimulator removal on 05-27-21 with resultant LLE weakness;  decompression and laminectomy on 12/28/15 at L4-L5  with redo decompression surgery on May 9,2019, post-op paraparesis with surgery to remove small hematoma; 07/12/18 fall at work (involved rolling chair) with hospital admission, transfer to CIR with d/c home 08/03/18; HTN, Bil foot surgery; left ventricular hypertrophy, chronic pain syndrome, dyspnea, hypertrophic cardiomyopathy; cervical fusion March 2023;  had carpal tunnel surgery RUE in April 2024:  LVH  PAIN:  10-05-23 Are you having pain? Intensity: 8/10 in low back:   "does not pay attention to the pain in my hand" Location:  low back, Rt hand Description: soreness in low back;  radiates down Rt lateral leg to the  knee:  burning sensation in Rt hand Duration; varies depending on activity or movement being performed - worse with bending over or pulling leg up to chest Aggravating factors - certain movements; unable to turn or rotate Alleviating factor - heating pad helps some; pain medication  PRECAUTIONS: Fall  WEIGHT BEARING RESTRICTIONS: No  FALLS: Has patient fallen in last 6 months? Yes. Number of falls 3  LIVING ENVIRONMENT: Lives with: lives with their spouse Lives in: House/apartment Stairs: Yes: External: 5 steps; on right going up Has following equipment at home: Single point cane, Wheelchair (manual), and [employer] crutch  PLOF: Independent with basic ADLs, Independent with household mobility with device, Independent with community mobility with device, and Independent with transfers  PATIENT GOALS: improve strength Rt leg  OBJECTIVE:   DIAGNOSTIC FINDINGS: IMPRESSION: 1. Interval anterior discectomy and fusion from C4 through C6 with improved patency of the spinal canal and only mild residual foraminal narrowing. 2. Stable mild spinal stenosis and mild to moderate left foraminal narrowing at C3-4. 3. Stable mild spinal stenosis and mild foraminal narrowing bilaterally at C6-7. Mild foraminal narrowing bilaterally at C7-T1. 4. No cord deformity or abnormal cord signal.  No acute findings. No recent MRI of lumbar region  LOWER EXTREMITY MMT:    MMT Right Eval Left Eval  Hip flexion 3+   Hip extension    Hip abduction    Hip adduction    Hip internal rotation    Hip external rotation    Knee flexion 3-   Knee extension 3-   Ankle dorsiflexion 2-   Ankle plantarflexion    Ankle inversion    Ankle eversion    (Blank rows = not tested)   Today's Treatment: 10-05-23  There were no vitals filed for this visit.    GAIT: Gait pattern: decreased step length- Right, decreased stance time- Right, decreased hip/knee flexion- Right, and decreased ankle dorsiflexion-  Right Distance walked: 40' x 2 reps (to & from [employer] from 1st mat table) Assistive device utilized: rollator  Level of assistance: SBA Comments: blue rocker AFO donned on RLE      05-07-23: Gait velocity: with [place] Walk on AFO - 1"18 secs with rollator = .42 ft/sec   With Toe off AFO 31.9 secs with rollator = 1.03 ft/sec      THEREX:  Pelvic tilt - 3 sec hold x 10 reps Knee to chest - RLE and LLE 10 reps each leg Hip abduction in hooklying -  bil. LE's 10 reps Knee to chest (heel slide) with min assist 10 reps each leg Seated hamstring stretch - RLE on floor Small range trunk rotation stretch to Rt side 10 sec hold x 1 rep; to Lt side 10 sec hold x 1 rep  SciFit level 2.0 x 6" with bil. Ue's and LE's - pt reported no back pain with this exercise  Gait velocity = 59.31 secs with use of rollator = .55 ft/sec with use of rollator  HEP - Medbridge  Access Code: [email or web address] URL: [email or web address] Date: 05/06/2023 Prepared by: Maebelle Munroe  Exercises - Beginner Bridge  - 1 x daily - 7 x weekly - 1 sets - 10 reps - 3 hold - Supine Heel Slide  - 1 x daily - 7 x weekly - 1 sets - 10 reps - Bent Knee Fallouts  - 1 x daily - 7 x weekly - 1 sets - 10 reps - Sidelying Hip Abduction  - 1 x daily - 7 x weekly - 1 sets - 10 reps - Seated Knee Flexion Extension AROM   - 1 x daily - 7 x weekly - 1 sets - 10 reps   PATIENT EDUCATION: Education details:  discussed STG's and progress achieved Person educated: Patient Education method: Explanation, demonstration, handouts  Education comprehension: verbalized understanding, demonstrated understanding  HOME EXERCISE PROGRAM: To be issued  GOALS: Goals reviewed with patient? Yes  SHORT TERM GOALS: Target date: 07-24-23   1.   Amb. 230' with RW with SBA without requiring seated rest period. Baseline:  Goal status: Goal met 07-21-23  2.  Perform sit to stand from mat without UE support to demo improved LE  strength. Baseline:    Goal status:  Not met - ongoing - 07-21-23  3.  Modified independent step negotiation with use of bil. Hand rails using step by step sequence. Baseline: Goal status: Goal met 07-21-23  4.   Improve TUG score from 22.25 secs to </= 19 secs with use of rollator.  Baseline:  22.25 secs with rollator & blue rocker AFO on 06-25-23;    07-21-23=  32.78, 22.62 secs with rollator Goal status: Not met - ongoing - 07-21-23  5.   Stand for 1" without UE support to demo improved standing balance. Baseline:  Goal status: Goal met 07-21-23    LONG TERM GOALS: Target date: 07-20-23  Amb. 230' with RW with SBA without requiring seated rest period. Baseline: 16' with RW Goal status: Partially met - 06-23-23  2.  Improve TUG score by at least 4 secs with use of RW for assistance with ambulation Baseline: TBA;  46 secs on 7-11;  35.97 secs on 06-16-23;  22.25 secs with rollator with blue rocker AFO Goal status: Goal met 06-25-23  3.  Perform 5x sit to stand transfers with LUE support only to demo improved strength in RLE.  Baseline:  Goal status: Goal met 05-29-23 - RUE used   4.  Modified independent step negotiation with use of bil. Hand rails using step by step sequence. Baseline:  Goal status: 06-23-23 -- SBA to supervision needed for safety  5.  Obtain new AFO for increased RLE stability to decrease fall risk with gait. Baseline:  Goal status: In progress - delivery scheduled for 06-24-23; Goal met 06-25-23  6.  Independent in updated HEP for RLE strengthening. Baseline:  Goal status: Goal met 06-16-23   NEW LONG TERM GOALS:  Target date;  08-21-23    NEW TARGET DATE:  10-09-23     1.  Amb. 300' with RW with blue rocker AFO on RLE with SBA without requiring seated rest period.    Baseline;  180', 58' with rollator;  46' with rollator -- 09-01-23    Status:  In progress  2.  Improve TUG score from 22.25 secs to </= 15 secs with use of rollator.  REVISED:  score </= 19  secs  Baseline:  22.25 secs with rollator & blue rocker AFO on 06-25-23  Status:  Deferred  3.  Pt will report ability to negotiate steps in her garage at home modified independently for safely entering & exiting home.  Baseline; Goal met 09-01-23  Status:  MET  4.  Increase gait velocity to >/= 1.5 ft/sec with use of AFO & rollator for increased gait efficiency.  Baseline:  With Toe off AFO 31.9 secs with rollator = 1.03 ft/sec    Status:  In progress - Not tested on 09-01-23 due to increased RLE weakness  5.  Independent in updated HEP for RLE strengthening.  Baseline:  Goal status: Goal met 08-25-23   REVISED LTG's:  TARGET DATE 10-09-23  1.  Amb. 300' with RW with blue rocker AFO on RLE with SBA without requiring seated rest period.    Baseline;  180', 59' with rollator;  15' with rollator -- 09-01-23    Status:  Deferred due to severity of back pain - 10-05-23  2.  Improve TUG score from 22.25 secs to </= 15 secs with use of rollator.  REVISED:  score </= 19 secs  Baseline:  22.25 secs with rollator & blue rocker AFO on 06-25-23  Status:  Deferred due to back pain - 10-05-23  3.  Pt will report ability to negotiate steps in her garage at home modified independently for safely entering & exiting home.  Baseline;   Status:  Goal met 09-01-23  4.  Increase gait velocity to >/= 1.5 ft/sec with use of AFO & rollator for increased gait efficiency.  Baseline:  With Toe off AFO 31.9 secs with rollator = 1.03 ft/sec :  10-05-23 - 59.31 secs with use of rollator = .55 ft/sec with use of rollator    Status: Goal not met 10-05-23  5.  Independent in updated HEP for RLE strengthening.  Baseline:  Goal status: Goal met 08-25-23 ASSESSMENT:  CLINICAL IMPRESSION: PT session focused assessment of LTG's for renewal.  Pt has had a decline in functional status due to exacerbation of back pain that started on 09-23-23.  LTG #1 deferred due to c/o back pain at 8/10 intensity.  LTG #2 deferred due to  c/o back pain in today's session and due to fall sustained on 09-01-23 due to RLE weakness/knee instability when this was previously assessed in clinic.  Gait velocity has decreased from 1.03 ft/sec to .55 ft/sec with use of rollator in today's session.  Pt reports surgery has been recommended by her neurosurgeon, but pt declines to have surgery at this time.  Cont with POC.    OBJECTIVE IMPAIRMENTS: Abnormal gait, decreased balance, decreased coordination, decreased endurance, decreased strength, and impaired tone.   ACTIVITY LIMITATIONS: carrying, bending, squatting, stairs, transfers, and locomotion level  PARTICIPATION LIMITATIONS: cleaning, laundry, shopping, and community activity  PERSONAL FACTORS: Behavior pattern, Past/current experiences, and Time since onset of injury/illness/exacerbation are also affecting patient's functional outcome.   REHAB POTENTIAL: Good  CLINICAL DECISION MAKING: Evolving/moderate complexity  EVALUATION COMPLEXITY: Moderate  PLAN:  PT FREQUENCY: 2x/week  PT DURATION:  4 weeks  PLANNED INTERVENTIONS: Therapeutic exercises, Therapeutic activity, Neuromuscular re-education, Balance training, Gait training, Patient/Family education, Self Care, Stair training, Orthotic/Fit training, DME instructions, and Aquatic Therapy  PLAN FOR NEXT SESSION:  continue RLE strengthening   Sherese Heyward, Donavan Burnet, PT 10/06/2023, 7:37 PM

## 2023-10-06 ENCOUNTER — Encounter: Payer: Self-pay | Admitting: Pharmacist

## 2023-10-06 ENCOUNTER — Other Ambulatory Visit (HOSPITAL_COMMUNITY): Payer: Self-pay

## 2023-10-06 ENCOUNTER — Other Ambulatory Visit: Payer: Self-pay

## 2023-10-06 ENCOUNTER — Encounter: Payer: Self-pay | Admitting: Physical Therapy

## 2023-10-06 MED ORDER — BETAMETHASONE DIPROPIONATE 0.05 % EX CREA
TOPICAL_CREAM | CUTANEOUS | 0 refills | Status: DC
  Filled 2023-10-06: qty 45, 28d supply, fill #0

## 2023-10-06 MED ORDER — HYDROCODONE-ACETAMINOPHEN 5-325 MG PO TABS
1.0000 | ORAL_TABLET | Freq: Four times a day (QID) | ORAL | 0 refills | Status: DC | PRN
  Filled 2023-10-06: qty 20, 5d supply, fill #0

## 2023-10-08 ENCOUNTER — Ambulatory Visit: Payer: PPO | Admitting: Physical Therapy

## 2023-10-08 DIAGNOSIS — R2689 Other abnormalities of gait and mobility: Secondary | ICD-10-CM

## 2023-10-08 DIAGNOSIS — M6281 Muscle weakness (generalized): Secondary | ICD-10-CM

## 2023-10-08 NOTE — Therapy (Signed)
OUTPATIENT PHYSICAL THERAPY NEURO TREATMENT NOTE      Patient Name: [name] MRN: [id number] DOB:28-Aug-1961, 62 y.o., female Today's Date: 10/09/2023   PCP: [place], [name]., MD REFERRING PROVIDER: Ranelle Oyster, MD  END OF SESSION:  PT End of Session - 10/09/23 1510     Visit Number 31    Number of Visits 38    Date for PT Re-Evaluation 11/13/23    Authorization Type [employer]    Authorization Time Period 04-21-23 - 06-27-23;  06-25-23 - 08-27-23;  09-01-23 - 10-09-23    Progress Note Due on Visit 40    PT Start Time 0932    PT Stop Time 1015    PT Time Calculation (min) 43 min    Equipment Utilized During Treatment Gait belt    Activity Tolerance Patient limited by pain   limited by RLE weakness   Behavior During Therapy [facility] for tasks assessed/performed                                        Past Medical History:  Diagnosis Date   Anxiety    Aortic atherosclerosis (HCC) 04/16/2021   Atypical angina (HCC)    Back pain    related to spinal stenosis and disc problem, radiates down left buttocks to leg., weakness occ.   Chest pain    a. 03/2015 Cath: nl cors; b. 03/2021 Cor CTA: Ca2+ = 0. Nl Cors.   Chronic pain syndrome    Dyspnea    GERD (gastroesophageal reflux disease)    Grade I diastolic dysfunction    Headache    Hyperlipidemia    Hypertension    Lumbar post-laminectomy syndrome    LVH (left ventricular hypertrophy) 12/15/2020   a. 11/2020 Echo: EF 65-70%, no rwma, sev asymm LVH with IVSd 1.9 cm. No LVOT obs @ rest. Gr1 DD. Triv MR.   PONV (postoperative nausea and vomiting)    Pulmonary nodules    a. 03/2021 CT Chest: 3mm pulm nodules in bilat lower lobes. F/u 1 yr.   Right foot drop    Syncope    a. 11/2020 Zio: No significant arrhythmias.   Vaginal foreign object    "Uses Femring"   Past Surgical History:  Procedure Laterality Date   ABDOMINAL HYSTERECTOMY     ANTERIOR CERVICAL DECOMP/DISCECTOMY  FUSION N/A 01/03/2022   Procedure: Anterior Cervical Decompression Fusion - Cervical four-Cervical five - Cervical five-Cervical six;  Surgeon: Tia Alert, MD;  Location: [facility] OR;  Service: Neurosurgery;  Laterality: N/A;   BIOPSY  12/16/2020   Procedure: BIOPSY;  Surgeon: Meryl Dare, MD;  Location: [facility] ENDOSCOPY;  Service: Endoscopy;;   CARDIAC CATHETERIZATION N/A 04/18/2015   Procedure: Left Heart Cath and Coronary Angiography;  Surgeon: Rinaldo Cloud, MD;  Location: [facility] INVASIVE CV LAB;  Service: Cardiovascular;  Laterality: N/A;   COLONOSCOPY     COLONOSCOPY W/ BIOPSIES AND POLYPECTOMY  2018   ESOPHAGOGASTRODUODENOSCOPY (EGD) WITH PROPOFOL N/A 12/16/2020   Procedure: ESOPHAGOGASTRODUODENOSCOPY (EGD) WITH PROPOFOL;  Surgeon: Meryl Dare, MD;  Location: [facility] ENDOSCOPY;  Service: Endoscopy;  Laterality: N/A;   FOOT SURGERY Bilateral    [facility] "bunion,bone spur, tendon" (1) -6'16, (1)-10'16   HEMATOMA EVACUATION N/A 01/05/2022   Procedure: Cervical Wound Exploration;  Surgeon: Coletta Memos, MD;  Location: [facility] OR;  Service: Neurosurgery;  Laterality: N/A;   IR EPIDUROGRAPHY  07/21/2018  LUMBAR LAMINECTOMY/DECOMPRESSION MICRODISCECTOMY Bilateral 12/28/2015   Procedure: MICRO LUMBAR DECOMPRESSION L4 - L5 BILATERALLY;  Surgeon: Jene Every, MD;  Location: [facility] ORS;  Service: Orthopedics;  Laterality: Bilateral;   LUMBAR LAMINECTOMY/DECOMPRESSION MICRODISCECTOMY Bilateral 03/04/2018   Procedure: Revision of Microlumbar Decompression Bilateral Lumbar Four-Five;  Surgeon: Jene Every, MD;  Location: [facility] OR;  Service: Orthopedics;  Laterality: Bilateral;  90 mins   SAVORY DILATION N/A 12/16/2020   Procedure: SAVORY DILATION;  Surgeon: Meryl Dare, MD;  Location: [facility] ENDOSCOPY;  Service: Endoscopy;  Laterality: N/A;   SPINAL CORD STIMULATOR INSERTION N/A 09/28/2019   Procedure: THORACIC SPINAL CORD STIMULATOR INSERTION;  Surgeon: Venita Lick, MD;  Location: [facility] OR;  Service:  Orthopedics;  Laterality: N/A;  2.5 hrs   SPINAL CORD STIMULATOR REMOVAL N/A 05/27/2021   Procedure: LUMBAR SPINAL CORD STIMULATOR REMOVAL;  Surgeon: Lucy Chris, MD;  Location: [facility] ORS;  Service: Neurosurgery;  Laterality: N/A;   TUBAL LIGATION     WISDOM TOOTH EXTRACTION     WOUND EXPLORATION N/A 03/04/2018   Procedure: EXPLORATION OF LUMBAR DECOMPRESSION WOUND;  Surgeon: Jene Every, MD;  Location: [facility] OR;  Service: Orthopedics;  Laterality: N/A;   Patient Active Problem List   Diagnosis Date Noted   Pain in thumb joint with movement of right hand 08/12/2023   Localized primary osteoarthritis of carpometacarpal joint of right thumb 07/29/2023   Right foot drop 07/29/2023   Frequent falls 04/14/2023   Gastroesophageal reflux disease 04/14/2023   Right upper extremity numbness 09/29/2022   Stage 3a chronic kidney disease (HCC) 09/29/2022   Cervical spondylosis with radiculopathy 01/05/2022   S/P cervical spinal fusion 01/03/2022   Cervical disc disorder with radiculopathy of cervical region 11/27/2021   Uncontrolled pain 05/27/2021   Syncope 05/13/2021   Depression with anxiety 05/13/2021   Chronic diastolic CHF (congestive heart failure) (HCC) 05/13/2021   Chronic back pain 05/13/2021   Chest tightness 05/13/2021   Lower abdominal pain 05/13/2021   Syncope and collapse 01/01/2021   Abnormal echocardiogram 01/01/2021   BPPV (benign paroxysmal positional vertigo), left 12/26/2020   Odynophagia    Dysphagia 12/15/2020   Postural dizziness with presyncope 12/14/2020   Lumbar post-laminectomy syndrome    Chronic pain 09/28/2019   Lumbar spinal stenosis    Radicular pain    Gait disorder    Difficulty in urination 07/13/2018   Back pain 07/12/2018   Hyperlipidemia 04/16/2018   GAD (generalized anxiety disorder) 04/16/2018   AKI (acute kidney injury) (HCC)    Benign essential HTN    Major depression, recurrent (HCC)    Lumbar radiculopathy 03/09/2018   Paraparesis (HCC)  03/09/2018   Essential hypertension    Chronic nonintractable headache    Leukocytosis    Acute blood loss anemia    Postoperative pain    Generalized weakness    Spinal stenosis at L4-L5 level 03/04/2018   Spinal stenosis of lumbar region 12/28/2015   Chest pain 04/16/2015    ONSET DATE: Referral date 04-01-23  REFERRING DIAG: M54.16 (ICD-10-CM) - Lumbar radiculopathy  THERAPY DIAG:  Muscle weakness (generalized)  Other abnormalities of gait and mobility  Rationale for Evaluation and Treatment: Rehabilitation  SUBJECTIVE:  SUBJECTIVE STATEMENT:  Pt reports BP has been better since Dr. [place] increased her BP medication; pt continues to c/o moderate to severe back pain - is now reconsidering possibility of having back surgery next year   Pt accompanied by: self  PERTINENT HISTORY: Frequent falls:  Spinal cord stimulator insertion on 09-28-19; stimulator removal on 05-27-21 with resultant LLE weakness;  decompression and laminectomy on 12/28/15 at L4-L5  with redo decompression surgery on May 9,2019, post-op paraparesis with surgery to remove small hematoma; 07/12/18 fall at work (involved rolling chair) with hospital admission, transfer to CIR with d/c home 08/03/18; HTN, Bil foot surgery; left ventricular hypertrophy, chronic pain syndrome, dyspnea, hypertrophic cardiomyopathy; cervical fusion March 2023;  had carpal tunnel surgery RUE in April 2024:  LVH  PAIN:  10-08-23 Are you having pain? Intensity: 7/10 in low back:   "does not pay attention to the pain in my hand" Location:  low back, Rt hand Description: soreness in low back;  radiates down Rt lateral leg to the knee:  burning sensation in Rt hand Duration; varies depending on activity or movement being performed - worse with bending over or  pulling leg up to chest Aggravating factors - certain movements; unable to turn or rotate Alleviating factor - heating pad helps some; pain medication  PRECAUTIONS: Fall  WEIGHT BEARING RESTRICTIONS: No  FALLS: Has patient fallen in last 6 months? Yes. Number of falls 3  LIVING ENVIRONMENT: Lives with: lives with their spouse Lives in: House/apartment Stairs: Yes: External: 5 steps; on right going up Has following equipment at home: Single point cane, Wheelchair (manual), and [place]strand crutch  PLOF: Independent with basic ADLs, Independent with household mobility with device, Independent with community mobility with device, and Independent with transfers  PATIENT GOALS: improve strength Rt leg  OBJECTIVE:   DIAGNOSTIC FINDINGS: IMPRESSION: 1. Interval anterior discectomy and fusion from C4 through C6 with improved patency of the spinal canal and only mild residual foraminal narrowing. 2. Stable mild spinal stenosis and mild to moderate left foraminal narrowing at C3-4. 3. Stable mild spinal stenosis and mild foraminal narrowing bilaterally at C6-7. Mild foraminal narrowing bilaterally at C7-T1. 4. No cord deformity or abnormal cord signal.  No acute findings. No recent MRI of lumbar region  LOWER EXTREMITY MMT:    MMT Right Eval Left Eval  Hip flexion 3+   Hip extension    Hip abduction    Hip adduction    Hip internal rotation    Hip external rotation    Knee flexion 3-   Knee extension 3-   Ankle dorsiflexion 2-   Ankle plantarflexion    Ankle inversion    Ankle eversion    (Blank rows = not tested)   Today's Treatment: 10-08-23  There were no vitals filed for this visit.    GAIT: Gait pattern: decreased step length- Right, decreased stance time- Right, decreased hip/knee flexion- Right, and decreased ankle dorsiflexion- Right Distance walked: 40' x 2 reps (to & from Scifit from 1st mat table) Assistive device utilized: rollator  Level of assistance:  SBA Comments: blue rocker AFO donned on RLE      05-07-23: Gait velocity: with [place] Walk on AFO - 1"18 secs with rollator = .42 ft/sec   With Toe off AFO 31.9 secs with rollator = 1.03 ft/sec  Gait velocity = 59.31 secs with use of rollator = .55 ft/sec with use of rollator    THEREX:  10-08-23  Glut isometrics  10 reps, 3 sec  hold Rt Hip flexion in hooklying 4 rpes - assisted Assisted RLE heel slides 10 reps Bil. Hip abdct. In hooklying 10 reps Small range trunk rotation - 10 reps - small range to each side with approx. 4 sec hold SAQ RLE 10 reps - 3-4 sec - no increased resistance added Pelvic tilt 5 sec hold x 10 reps  Manual distraction RLE - no change in back pain reported  SciFit level 2.5 x 10" with bil. Ue's and LE's - pt reported no back pain with this exercise    HEP - Medbridge  Access Code: [email or web address] URL: [email or web address] Date: 05/06/2023 Prepared by: Maebelle Munroe  Exercises - Beginner Bridge  - 1 x daily - 7 x weekly - 1 sets - 10 reps - 3 hold - Supine Heel Slide  - 1 x daily - 7 x weekly - 1 sets - 10 reps - Bent Knee Fallouts  - 1 x daily - 7 x weekly - 1 sets - 10 reps - Sidelying Hip Abduction  - 1 x daily - 7 x weekly - 1 sets - 10 reps - Seated Knee Flexion Extension AROM   - 1 x daily - 7 x weekly - 1 sets - 10 reps   PATIENT EDUCATION: Education details:  discussed STG's and progress achieved Person educated: Patient Education method: Explanation, demonstration, handouts  Education comprehension: verbalized understanding, demonstrated understanding  HOME EXERCISE PROGRAM: To be issued  GOALS: Goals reviewed with patient? Yes  SHORT TERM GOALS: Target date: 07-24-23   1.   Amb. 230' with RW with SBA without requiring seated rest period. Baseline:  Goal status: Goal met 07-21-23  2.  Perform sit to stand from mat without UE support to demo improved LE strength. Baseline:    Goal status:  Not met - ongoing -  07-21-23  3.  Modified independent step negotiation with use of bil. Hand rails using step by step sequence. Baseline: Goal status: Goal met 07-21-23  4.   Improve TUG score from 22.25 secs to </= 19 secs with use of rollator.  Baseline:  22.25 secs with rollator & blue rocker AFO on 06-25-23;    07-21-23=  32.78, 22.62 secs with rollator Goal status: Not met - ongoing - 07-21-23  5.   Stand for 1" without UE support to demo improved standing balance. Baseline:  Goal status: Goal met 07-21-23    LONG TERM GOALS: Target date: 07-20-23  Amb. 230' with RW with SBA without requiring seated rest period. Baseline: 5' with RW Goal status: Partially met - 06-23-23  2.  Improve TUG score by at least 4 secs with use of RW for assistance with ambulation Baseline: TBA;  46 secs on 7-11;  35.97 secs on 06-16-23;  22.25 secs with rollator with blue rocker AFO Goal status: Goal met 06-25-23  3.  Perform 5x sit to stand transfers with LUE support only to demo improved strength in RLE.  Baseline:  Goal status: Goal met 05-29-23 - RUE used   4.  Modified independent step negotiation with use of bil. Hand rails using step by step sequence. Baseline:  Goal status: 06-23-23 -- SBA to supervision needed for safety  5.  Obtain new AFO for increased RLE stability to decrease fall risk with gait. Baseline:  Goal status: In progress - delivery scheduled for 06-24-23; Goal met 06-25-23  6.  Independent in updated HEP for RLE strengthening. Baseline:  Goal status: Goal met 06-16-23   NEW LONG TERM GOALS:  Target date;  08-21-23    NEW TARGET DATE:  10-09-23     1.  Amb. 300' with RW with blue rocker AFO on RLE with SBA without requiring seated rest period.    Baseline;  180', 60' with rollator;  49' with rollator -- 09-01-23    Status:  In progress  2.  Improve TUG score from 22.25 secs to </= 15 secs with use of rollator.  REVISED:  score </= 19 secs  Baseline:  22.25 secs with rollator & blue rocker AFO on  06-25-23  Status:  Deferred  3.  Pt will report ability to negotiate steps in her garage at home modified independently for safely entering & exiting home.  Baseline; Goal met 09-01-23  Status:  MET  4.  Increase gait velocity to >/= 1.5 ft/sec with use of AFO & rollator for increased gait efficiency.  Baseline:  With Toe off AFO 31.9 secs with rollator = 1.03 ft/sec    Status:  In progress - Not tested on 09-01-23 due to increased RLE weakness  5.  Independent in updated HEP for RLE strengthening.  Baseline:  Goal status: Goal met 08-25-23   REVISED LTG's:  TARGET DATE 10-09-23  1.  Amb. 300' with RW with blue rocker AFO on RLE with SBA without requiring seated rest period.    Baseline;  180', 69' with rollator;  35' with rollator -- 09-01-23    Status:  Deferred due to severity of back pain - 10-05-23  2.  Improve TUG score from 22.25 secs to </= 15 secs with use of rollator.  REVISED:  score </= 19 secs  Baseline:  22.25 secs with rollator & blue rocker AFO on 06-25-23  Status:  Deferred due to back pain - 10-05-23  3.  Pt will report ability to negotiate steps in her garage at home modified independently for safely entering & exiting home.  Baseline;   Status:  Goal met 09-01-23  4.  Increase gait velocity to >/= 1.5 ft/sec with use of AFO & rollator for increased gait efficiency.  Baseline:  With Toe off AFO 31.9 secs with rollator = 1.03 ft/sec :  10-05-23 - 59.31 secs with use of rollator = .55 ft/sec with use of rollator    Status: Goal not met 10-05-23  5.  Independent in updated HEP for RLE strengthening.  Baseline:  Goal status: Goal met 08-25-23 ASSESSMENT:  CLINICAL IMPRESSION: PT session focused on gentle low back stretching & strengthening exercises.  Pt continues to report moderate to severe back pain at 7/10 intensity at start of today's session.  Pt reported slight decrease in back pain at end of session at 6/10 intensity rating.  Pt is unable to tolerate RLE  strengthening exercises in closed chain position due to severity of low back pain.  Cont with POC.    OBJECTIVE IMPAIRMENTS: Abnormal gait, decreased balance, decreased coordination, decreased endurance, decreased strength, and impaired tone.   ACTIVITY LIMITATIONS: carrying, bending, squatting, stairs, transfers, and locomotion level  PARTICIPATION LIMITATIONS: cleaning, laundry, shopping, and community activity  PERSONAL FACTORS: Behavior pattern, Past/current experiences, and Time since onset of injury/illness/exacerbation are also affecting patient's functional outcome.   REHAB POTENTIAL: Good  CLINICAL DECISION MAKING: Evolving/moderate complexity  EVALUATION COMPLEXITY: Moderate  PLAN:  PT FREQUENCY: 2x/week  PT DURATION: 4 weeks  PLANNED INTERVENTIONS: Therapeutic exercises, Therapeutic activity, Neuromuscular re-education, Balance training, Gait training, Patient/Family education, Self Care, Stair training, Orthotic/Fit training, DME instructions, and Aquatic Therapy  PLAN FOR  NEXT SESSION:  continue RLE strengthening   Fatime Biswell, Donavan Burnet, PT 10/09/2023, 3:12 PM

## 2023-10-09 ENCOUNTER — Encounter: Payer: Self-pay | Admitting: Physical Therapy

## 2023-10-09 ENCOUNTER — Institutional Professional Consult (permissible substitution): Payer: PPO | Admitting: Neurology

## 2023-10-12 ENCOUNTER — Other Ambulatory Visit (HOSPITAL_COMMUNITY): Payer: Self-pay

## 2023-10-12 ENCOUNTER — Telehealth: Payer: Self-pay | Admitting: Physical Medicine & Rehabilitation

## 2023-10-12 NOTE — Telephone Encounter (Signed)
Patient called in requesting medication refill on Baclofen , patient is using as needed and since back pain has returned would like medication refill if possible, patient uses [facility]

## 2023-10-13 ENCOUNTER — Ambulatory Visit: Payer: Self-pay | Admitting: Physical Therapy

## 2023-10-13 ENCOUNTER — Other Ambulatory Visit: Payer: Self-pay

## 2023-10-13 DIAGNOSIS — R2689 Other abnormalities of gait and mobility: Secondary | ICD-10-CM | POA: Diagnosis not present

## 2023-10-13 DIAGNOSIS — M6281 Muscle weakness (generalized): Secondary | ICD-10-CM

## 2023-10-13 MED ORDER — BACLOFEN 10 MG PO TABS
10.0000 mg | ORAL_TABLET | Freq: Three times a day (TID) | ORAL | 3 refills | Status: AC | PRN
  Filled 2023-10-13: qty 60, 20d supply, fill #0
  Filled 2023-12-30: qty 60, 20d supply, fill #1
  Filled 2024-04-01: qty 60, 20d supply, fill #2
  Filled 2024-07-18: qty 60, 20d supply, fill #3

## 2023-10-13 NOTE — Therapy (Unsigned)
OUTPATIENT PHYSICAL THERAPY NEURO TREATMENT NOTE      Patient Name: [name] MRN: [id number] DOB:1961/01/31, 62 y.o., female Today's Date: 10/14/2023   PCP: [place], [name]., MD REFERRING PROVIDER: Ranelle Oyster, MD  END OF SESSION:  PT End of Session - 10/14/23 1228     Visit Number 32    Number of Visits 38    Date for PT Re-Evaluation 11/13/23    Authorization Type [employer]    Authorization Time Period 04-21-23 - 06-27-23;  06-25-23 - 08-27-23;  09-01-23 - 10-27-23    Progress Note Due on Visit 40    PT Start Time 0847    PT Stop Time 0931    PT Time Calculation (min) 44 min    Equipment Utilized During Treatment --    Activity Tolerance Patient limited by pain   limited by RLE weakness   Behavior During Therapy [facility] for tasks assessed/performed                                         Past Medical History:  Diagnosis Date   Anxiety    Aortic atherosclerosis (HCC) 04/16/2021   Atypical angina (HCC)    Back pain    related to spinal stenosis and disc problem, radiates down left buttocks to leg., weakness occ.   Chest pain    a. 03/2015 Cath: nl cors; b. 03/2021 Cor CTA: Ca2+ = 0. Nl Cors.   Chronic pain syndrome    Dyspnea    GERD (gastroesophageal reflux disease)    Grade I diastolic dysfunction    Headache    Hyperlipidemia    Hypertension    Lumbar post-laminectomy syndrome    LVH (left ventricular hypertrophy) 12/15/2020   a. 11/2020 Echo: EF 65-70%, no rwma, sev asymm LVH with IVSd 1.9 cm. No LVOT obs @ rest. Gr1 DD. Triv MR.   PONV (postoperative nausea and vomiting)    Pulmonary nodules    a. 03/2021 CT Chest: 3mm pulm nodules in bilat lower lobes. F/u 1 yr.   Right foot drop    Syncope    a. 11/2020 Zio: No significant arrhythmias.   Vaginal foreign object    "Uses Femring"   Past Surgical History:  Procedure Laterality Date   ABDOMINAL HYSTERECTOMY     ANTERIOR CERVICAL DECOMP/DISCECTOMY FUSION N/A  01/03/2022   Procedure: Anterior Cervical Decompression Fusion - Cervical four-Cervical five - Cervical five-Cervical six;  Surgeon: Tia Alert, MD;  Location: [facility] OR;  Service: Neurosurgery;  Laterality: N/A;   BIOPSY  12/16/2020   Procedure: BIOPSY;  Surgeon: Meryl Dare, MD;  Location: [facility] ENDOSCOPY;  Service: Endoscopy;;   CARDIAC CATHETERIZATION N/A 04/18/2015   Procedure: Left Heart Cath and Coronary Angiography;  Surgeon: Rinaldo Cloud, MD;  Location: [facility] INVASIVE CV LAB;  Service: Cardiovascular;  Laterality: N/A;   COLONOSCOPY     COLONOSCOPY W/ BIOPSIES AND POLYPECTOMY  2018   ESOPHAGOGASTRODUODENOSCOPY (EGD) WITH PROPOFOL N/A 12/16/2020   Procedure: ESOPHAGOGASTRODUODENOSCOPY (EGD) WITH PROPOFOL;  Surgeon: Meryl Dare, MD;  Location: [facility] ENDOSCOPY;  Service: Endoscopy;  Laterality: N/A;   FOOT SURGERY Bilateral    [facility] "bunion,bone spur, tendon" (1) -6'16, (1)-10'16   HEMATOMA EVACUATION N/A 01/05/2022   Procedure: Cervical Wound Exploration;  Surgeon: Coletta Memos, MD;  Location: [facility] OR;  Service: Neurosurgery;  Laterality: N/A;   IR EPIDUROGRAPHY  07/21/2018  LUMBAR LAMINECTOMY/DECOMPRESSION MICRODISCECTOMY Bilateral 12/28/2015   Procedure: MICRO LUMBAR DECOMPRESSION L4 - L5 BILATERALLY;  Surgeon: Jene Every, MD;  Location: [facility] ORS;  Service: Orthopedics;  Laterality: Bilateral;   LUMBAR LAMINECTOMY/DECOMPRESSION MICRODISCECTOMY Bilateral 03/04/2018   Procedure: Revision of Microlumbar Decompression Bilateral Lumbar Four-Five;  Surgeon: Jene Every, MD;  Location: [facility] OR;  Service: Orthopedics;  Laterality: Bilateral;  90 mins   SAVORY DILATION N/A 12/16/2020   Procedure: SAVORY DILATION;  Surgeon: Meryl Dare, MD;  Location: [facility] ENDOSCOPY;  Service: Endoscopy;  Laterality: N/A;   SPINAL CORD STIMULATOR INSERTION N/A 09/28/2019   Procedure: THORACIC SPINAL CORD STIMULATOR INSERTION;  Surgeon: Venita Lick, MD;  Location: [facility] OR;  Service:  Orthopedics;  Laterality: N/A;  2.5 hrs   SPINAL CORD STIMULATOR REMOVAL N/A 05/27/2021   Procedure: LUMBAR SPINAL CORD STIMULATOR REMOVAL;  Surgeon: Lucy Chris, MD;  Location: [facility] ORS;  Service: Neurosurgery;  Laterality: N/A;   TUBAL LIGATION     WISDOM TOOTH EXTRACTION     WOUND EXPLORATION N/A 03/04/2018   Procedure: EXPLORATION OF LUMBAR DECOMPRESSION WOUND;  Surgeon: Jene Every, MD;  Location: [facility] OR;  Service: Orthopedics;  Laterality: N/A;   Patient Active Problem List   Diagnosis Date Noted   Pain in thumb joint with movement of right hand 08/12/2023   Localized primary osteoarthritis of carpometacarpal joint of right thumb 07/29/2023   Right foot drop 07/29/2023   Frequent falls 04/14/2023   Gastroesophageal reflux disease 04/14/2023   Right upper extremity numbness 09/29/2022   Stage 3a chronic kidney disease (HCC) 09/29/2022   Cervical spondylosis with radiculopathy 01/05/2022   S/P cervical spinal fusion 01/03/2022   Cervical disc disorder with radiculopathy of cervical region 11/27/2021   Uncontrolled pain 05/27/2021   Syncope 05/13/2021   Depression with anxiety 05/13/2021   Chronic diastolic CHF (congestive heart failure) (HCC) 05/13/2021   Chronic back pain 05/13/2021   Chest tightness 05/13/2021   Lower abdominal pain 05/13/2021   Syncope and collapse 01/01/2021   Abnormal echocardiogram 01/01/2021   BPPV (benign paroxysmal positional vertigo), left 12/26/2020   Odynophagia    Dysphagia 12/15/2020   Postural dizziness with presyncope 12/14/2020   Lumbar post-laminectomy syndrome    Chronic pain 09/28/2019   Lumbar spinal stenosis    Radicular pain    Gait disorder    Difficulty in urination 07/13/2018   Back pain 07/12/2018   Hyperlipidemia 04/16/2018   GAD (generalized anxiety disorder) 04/16/2018   AKI (acute kidney injury) (HCC)    Benign essential HTN    Major depression, recurrent (HCC)    Lumbar radiculopathy 03/09/2018   Paraparesis (HCC)  03/09/2018   Essential hypertension    Chronic nonintractable headache    Leukocytosis    Acute blood loss anemia    Postoperative pain    Generalized weakness    Spinal stenosis at L4-L5 level 03/04/2018   Spinal stenosis of lumbar region 12/28/2015   Chest pain 04/16/2015    ONSET DATE: Referral date 04-01-23  REFERRING DIAG: M54.16 (ICD-10-CM) - Lumbar radiculopathy  THERAPY DIAG:  Muscle weakness (generalized)  Other abnormalities of gait and mobility  Rationale for Evaluation and Treatment: Rehabilitation  SUBJECTIVE:  SUBJECTIVE STATEMENT:  Pt reports back pain is about the same - says she pretty much stays on the heating pad at home because it does provide relief of the back pain; has appt on Thursday this week with Dr. Yetta Barre and is considering having surgery  Pt accompanied by: self  PERTINENT HISTORY: Frequent falls:  Spinal cord stimulator insertion on 09-28-19; stimulator removal on 05-27-21 with resultant LLE weakness;  decompression and laminectomy on 12/28/15 at L4-L5  with redo decompression surgery on May 9,2019, post-op paraparesis with surgery to remove small hematoma; 07/12/18 fall at work (involved rolling chair) with hospital admission, transfer to CIR with d/c home 08/03/18; HTN, Bil foot surgery; left ventricular hypertrophy, chronic pain syndrome, dyspnea, hypertrophic cardiomyopathy; cervical fusion March 2023;  had carpal tunnel surgery RUE in April 2024:  LVH  PAIN:  10-13-23  Are you having pain? Intensity: 5-6/10 in low back:   "does not pay attention to the pain in my hand" Location:  low back, Rt hand Description: soreness in low back;  radiates down Rt lateral leg to the knee:  burning sensation in Rt hand Duration; varies depending on activity or movement being performed  - worse with bending over or pulling leg up to chest Aggravating factors - certain movements; unable to turn or rotate Alleviating factor - heating pad helps some; pain medication  PRECAUTIONS: Fall  WEIGHT BEARING RESTRICTIONS: No  FALLS: Has patient fallen in last 6 months? Yes. Number of falls 3  LIVING ENVIRONMENT: Lives with: lives with their spouse Lives in: House/apartment Stairs: Yes: External: 5 steps; on right going up Has following equipment at home: Single point cane, Wheelchair (manual), and L[place]strand crutch  PLOF: Independent with basic ADLs, Independent with household mobility with device, Independent with community mobility with device, and Independent with transfers  PATIENT GOALS: improve strength Rt leg  OBJECTIVE:   DIAGNOSTIC FINDINGS: IMPRESSION: 1. Interval anterior discectomy and fusion from C4 through C6 with improved patency of the spinal canal and only mild residual foraminal narrowing. 2. Stable mild spinal stenosis and mild to moderate left foraminal narrowing at C3-4. 3. Stable mild spinal stenosis and mild foraminal narrowing bilaterally at C6-7. Mild foraminal narrowing bilaterally at C7-T1. 4. No cord deformity or abnormal cord signal.  No acute findings. No recent MRI of lumbar region  LOWER EXTREMITY MMT:    MMT Right Eval Left Eval  Hip flexion 3+   Hip extension    Hip abduction    Hip adduction    Hip internal rotation    Hip external rotation    Knee flexion 3-   Knee extension 3-   Ankle dorsiflexion 2-   Ankle plantarflexion    Ankle inversion    Ankle eversion    (Blank rows = not tested)   Today's Treatment: 10-13-23  There were no vitals filed for this visit.    GAIT: Gait pattern: decreased step length- Right, decreased stance time- Right, decreased hip/knee flexion- Right, and decreased ankle dorsiflexion- Right Distance walked: 40' x 2 reps (to & from Scifit from 1st mat table) Assistive device utilized:  rollator  Level of assistance: SBA Comments: blue rocker AFO donned on RLE      05-07-23: Gait velocity: with [place] Walk on AFO - 1"18 secs with rollator = .42 ft/sec   With Toe off AFO 31.9 secs with rollator = 1.03 ft/sec  Gait velocity = 59.31 secs with use of rollator = .55 ft/sec with use of rollator    THEREX:  10-13-23  Pelvic tilt 5 sec hold x 10 reps Glut isometrics  10 reps, 5 sec hold Rt Hip flexion in hooklying 10 reps - assisted Assisted RLE heel slides 10 reps - small ROM Bil. Hip abdct. In hooklying 10 reps Small range trunk rotation - 10 reps - small range to each side with approx. 4 sec hold SAQ RLE 10 reps - 3-4 sec - no increased resistance added   Manual distraction RLE - no change in back pain reported  SciFit level 2.5 x 10" with bil. Ue's and LE's - pt reported no back pain with this exercise   HEP - Medbridge  Access Code: [email or web address] URL: [email or web address] Date: 05/06/2023 Prepared by: Maebelle Munroe  Exercises - Beginner Bridge  - 1 x daily - 7 x weekly - 1 sets - 10 reps - 3 hold - Supine Heel Slide  - 1 x daily - 7 x weekly - 1 sets - 10 reps - Bent Knee Fallouts  - 1 x daily - 7 x weekly - 1 sets - 10 reps - Sidelying Hip Abduction  - 1 x daily - 7 x weekly - 1 sets - 10 reps - Seated Knee Flexion Extension AROM   - 1 x daily - 7 x weekly - 1 sets - 10 reps   PATIENT EDUCATION: Education details:  cont with HEP Person educated: Patient Education method: Explanation, demonstration, handouts  Education comprehension: verbalized understanding, demonstrated understanding  HOME EXERCISE PROGRAM: To be issued  GOALS: Goals reviewed with patient? Yes  SHORT TERM GOALS: Target date: 07-24-23   1.   Amb. 230' with RW with SBA without requiring seated rest period. Baseline:  Goal status: Goal met 07-21-23  2.  Perform sit to stand from mat without UE support to demo improved LE strength. Baseline:    Goal status:  Not met  - ongoing - 07-21-23  3.  Modified independent step negotiation with use of bil. Hand rails using step by step sequence. Baseline: Goal status: Goal met 07-21-23  4.   Improve TUG score from 22.25 secs to </= 19 secs with use of rollator.  Baseline:  22.25 secs with rollator & blue rocker AFO on 06-25-23;    07-21-23=  32.78, 22.62 secs with rollator Goal status: Not met - ongoing - 07-21-23  5.   Stand for 1" without UE support to demo improved standing balance. Baseline:  Goal status: Goal met 07-21-23    LONG TERM GOALS: Target date: 07-20-23  Amb. 230' with RW with SBA without requiring seated rest period. Baseline: 27' with RW Goal status: Partially met - 06-23-23  2.  Improve TUG score by at least 4 secs with use of RW for assistance with ambulation Baseline: TBA;  46 secs on 7-11;  35.97 secs on 06-16-23;  22.25 secs with rollator with blue rocker AFO Goal status: Goal met 06-25-23  3.  Perform 5x sit to stand transfers with LUE support only to demo improved strength in RLE.  Baseline:  Goal status: Goal met 05-29-23 - RUE used   4.  Modified independent step negotiation with use of bil. Hand rails using step by step sequence. Baseline:  Goal status: 06-23-23 -- SBA to supervision needed for safety  5.  Obtain new AFO for increased RLE stability to decrease fall risk with gait. Baseline:  Goal status: In progress - delivery scheduled for 06-24-23; Goal met 06-25-23  6.  Independent in updated HEP for RLE strengthening. Baseline:  Goal status:  Goal met 06-16-23   NEW LONG TERM GOALS:  Target date;  08-21-23    NEW TARGET DATE:  10-09-23     1.  Amb. 300' with RW with blue rocker AFO on RLE with SBA without requiring seated rest period.    Baseline;  180', 50' with rollator;  46' with rollator -- 09-01-23    Status:  In progress  2.  Improve TUG score from 22.25 secs to </= 15 secs with use of rollator.  REVISED:  score </= 19 secs  Baseline:  22.25 secs with rollator & blue  rocker AFO on 06-25-23  Status:  Deferred  3.  Pt will report ability to negotiate steps in her garage at home modified independently for safely entering & exiting home.  Baseline; Goal met 09-01-23  Status:  MET  4.  Increase gait velocity to >/= 1.5 ft/sec with use of AFO & rollator for increased gait efficiency.  Baseline:  With Toe off AFO 31.9 secs with rollator = 1.03 ft/sec    Status:  In progress - Not tested on 09-01-23 due to increased RLE weakness  5.  Independent in updated HEP for RLE strengthening.  Baseline:  Goal status: Goal met 08-25-23   REVISED LTG's:  TARGET DATE 10-09-23  1.  Amb. 300' with RW with blue rocker AFO on RLE with SBA without requiring seated rest period.    Baseline;  180', 61' with rollator;  70' with rollator -- 09-01-23    Status:  Deferred due to severity of back pain - 10-05-23  2.  Improve TUG score from 22.25 secs to </= 15 secs with use of rollator.  REVISED:  score </= 19 secs  Baseline:  22.25 secs with rollator & blue rocker AFO on 06-25-23  Status:  Deferred due to back pain - 10-05-23  3.  Pt will report ability to negotiate steps in her garage at home modified independently for safely entering & exiting home.  Baseline;   Status:  Goal met 09-01-23  4.  Increase gait velocity to >/= 1.5 ft/sec with use of AFO & rollator for increased gait efficiency.  Baseline:  With Toe off AFO 31.9 secs with rollator = 1.03 ft/sec :  10-05-23 - 59.31 secs with use of rollator = .55 ft/sec with use of rollator    Status: Goal not met 10-05-23  5.  Independent in updated HEP for RLE strengthening.  Baseline:  Goal status: Goal met 08-25-23 ASSESSMENT:  CLINICAL IMPRESSION: PT session focused on gentle low back stretching & strengthening exercises.  Pt continues to report moderate back pain at 5-6/10 intensity at start of today's session.  Pt unable to tolerate closed chain strengthening exercises for RLE due to c/o back pain.  Pt reports she is now  considering having surgery - will discuss with neurosurgeon at appt on Thursday this week.  Cont with POC.    OBJECTIVE IMPAIRMENTS: Abnormal gait, decreased balance, decreased coordination, decreased endurance, decreased strength, and impaired tone.   ACTIVITY LIMITATIONS: carrying, bending, squatting, stairs, transfers, and locomotion level  PARTICIPATION LIMITATIONS: cleaning, laundry, shopping, and community activity  PERSONAL FACTORS: Behavior pattern, Past/current experiences, and Time since onset of injury/illness/exacerbation are also affecting patient's functional outcome.   REHAB POTENTIAL: Good  CLINICAL DECISION MAKING: Evolving/moderate complexity  EVALUATION COMPLEXITY: Moderate  PLAN:  PT FREQUENCY: 2x/week  PT DURATION: 4 weeks  PLANNED INTERVENTIONS: Therapeutic exercises, Therapeutic activity, Neuromuscular re-education, Balance training, Gait training, Patient/Family education, Self Care, Stair training, Orthotic/Fit training,  DME instructions, and Aquatic Therapy  PLAN FOR NEXT SESSION:  continue RLE strengthening   Mumin Denomme, Donavan Burnet, PT 10/14/2023, 12:31 PM

## 2023-10-13 NOTE — Telephone Encounter (Signed)
Rx written and sent to the pharmacy. Thanks!

## 2023-10-13 NOTE — Addendum Note (Signed)
Addended by: Faith Rogue T on: 10/13/2023 09:29 AM   Modules accepted: Orders

## 2023-10-14 ENCOUNTER — Ambulatory Visit (INDEPENDENT_AMBULATORY_CARE_PROVIDER_SITE_OTHER): Payer: PPO | Admitting: Family Medicine

## 2023-10-14 ENCOUNTER — Encounter: Payer: Self-pay | Admitting: Family Medicine

## 2023-10-14 VITALS — BP 128/80 | HR 91 | Resp 16 | Ht 66.0 in

## 2023-10-14 DIAGNOSIS — F330 Major depressive disorder, recurrent, mild: Secondary | ICD-10-CM | POA: Diagnosis not present

## 2023-10-14 DIAGNOSIS — N1831 Chronic kidney disease, stage 3a: Secondary | ICD-10-CM | POA: Diagnosis not present

## 2023-10-14 DIAGNOSIS — F411 Generalized anxiety disorder: Secondary | ICD-10-CM | POA: Diagnosis not present

## 2023-10-14 DIAGNOSIS — I10 Essential (primary) hypertension: Secondary | ICD-10-CM

## 2023-10-14 DIAGNOSIS — G8220 Paraplegia, unspecified: Secondary | ICD-10-CM | POA: Diagnosis not present

## 2023-10-14 DIAGNOSIS — E785 Hyperlipidemia, unspecified: Secondary | ICD-10-CM | POA: Diagnosis not present

## 2023-10-14 DIAGNOSIS — Z0000 Encounter for general adult medical examination without abnormal findings: Secondary | ICD-10-CM

## 2023-10-14 LAB — LIPID PANEL
Cholesterol: 199 mg/dL (ref 0–200)
HDL: 72.6 mg/dL (ref >39.00)
LDL Cholesterol: 117 mg/dL — ABNORMAL HIGH (ref 0–99)
NonHDL: 126.2
Total CHOL/HDL Ratio: 3
Triglycerides: 45 mg/dL (ref 0.0–149.0)
VLDL: 9 mg/dL (ref 0.0–40.0)

## 2023-10-14 LAB — VITAMIN D 25 HYDROXY (VIT D DEFICIENCY, FRACTURES): VITD: 22.06 ng/mL — ABNORMAL LOW (ref 30.00–100.00)

## 2023-10-14 LAB — COMPREHENSIVE METABOLIC PANEL
ALT: 20 U/L (ref 0–35)
AST: 14 U/L (ref 0–37)
Albumin: 3.8 g/dL (ref 3.5–5.2)
Alkaline Phosphatase: 52 U/L (ref 39–117)
BUN: 18 mg/dL (ref 6–23)
CO2: 29 meq/L (ref 19–32)
Calcium: 8.6 mg/dL (ref 8.4–10.5)
Chloride: 104 meq/L (ref 96–112)
Creatinine, Ser: 1.19 mg/dL (ref 0.40–1.20)
GFR: 49.12 mL/min — ABNORMAL LOW (ref >60.00)
Glucose, Bld: 80 mg/dL (ref 70–99)
Potassium: 4 meq/L (ref 3.5–5.1)
Sodium: 139 meq/L (ref 135–145)
Total Bilirubin: 0.4 mg/dL (ref 0.2–1.2)
Total Protein: 6.1 g/dL (ref 6.0–8.3)

## 2023-10-14 LAB — MICROALBUMIN / CREATININE URINE RATIO
Creatinine,U: 217.6 mg/dL
Microalb Creat Ratio: 0.4 mg/g (ref 0.0–30.0)
Microalb, Ur: 1 mg/dL (ref 0.0–1.9)

## 2023-10-14 NOTE — Assessment & Plan Note (Addendum)
BP adequately controlled.Occasionally elevated BP's, usually when she has severe pain. Continue Spironolactone 50 mg daily, Carvedilol 25 mg bid, Telmisartan 40 mg daily,and Amlodipine 5 mg daily. Low salt diet to continue. Eye exam is current. Continue monitoring BP regularly.

## 2023-10-14 NOTE — Patient Instructions (Addendum)
A few things to remember from today's visit:  Routine general medical examination at a health care facility  Essential hypertension - Plan: Comprehensive metabolic panel  Stage 3a chronic kidney disease (HCC) - Plan: Comprehensive metabolic panel, VITAMIN D 25 Hydroxy (Vit-D Deficiency, Fractures), Microalbumin / creatinine urine ratio  Hyperlipidemia, unspecified hyperlipidemia type - Plan: Comprehensive metabolic panel, Lipid panel  If you need refills for medications you take chronically, please call your pharmacy. Do not use My Chart to request refills or for acute issues that need immediate attention. If you send a my chart message, it may take a few days to be addressed, specially if I am not in the office.  Please be sure medication list is accurate. If a new problem present, please set up appointment sooner than planned today.  Health Maintenance, Female Adopting a healthy lifestyle and getting preventive care are important in promoting health and wellness. Ask your health care provider about: The right schedule for you to have regular tests and exams. Things you can do on your own to prevent diseases and keep yourself healthy. What should I know about diet, weight, and exercise? Eat a healthy diet  Eat a diet that includes plenty of vegetables, fruits, low-fat dairy products, and lean protein. Do not eat a lot of foods that are high in solid fats, added sugars, or sodium. Maintain a healthy weight Body mass index (BMI) is used to identify weight problems. It estimates body fat based on height and weight. Your health care provider can help determine your BMI and help you achieve or maintain a healthy weight. Get regular exercise Get regular exercise. This is one of the most important things you can do for your health. Most adults should: Exercise for at least 150 minutes each week. The exercise should increase your heart rate and make you sweat (moderate-intensity exercise). Do  strengthening exercises at least twice a week. This is in addition to the moderate-intensity exercise. Spend less time sitting. Even light physical activity can be beneficial. Watch cholesterol and blood lipids Have your blood tested for lipids and cholesterol at 62 years of age, then have this test every 5 years. Have your cholesterol levels checked more often if: Your lipid or cholesterol levels are high. You are older than 62 years of age. You are at high risk for heart disease. What should I know about cancer screening? Depending on your health history and family history, you may need to have cancer screening at various ages. This may include screening for: Breast cancer. Cervical cancer. Colorectal cancer. Skin cancer. Lung cancer. What should I know about heart disease, diabetes, and high blood pressure? Blood pressure and heart disease High blood pressure causes heart disease and increases the risk of stroke. This is more likely to develop in people who have high blood pressure readings or are overweight. Have your blood pressure checked: Every 3-5 years if you are 59-26 years of age. Every year if you are 69 years old or older. Diabetes Have regular diabetes screenings. This checks your fasting blood sugar level. Have the screening done: Once every three years after age 43 if you are at a normal weight and have a low risk for diabetes. More often and at a younger age if you are overweight or have a high risk for diabetes. What should I know about preventing infection? Hepatitis B If you have a higher risk for hepatitis B, you should be screened for this virus. Talk with your health care provider to  find out if you are at risk for hepatitis B infection. Hepatitis C Testing is recommended for: Everyone born from [birth year] through 1965. Anyone with known risk factors for hepatitis C. Sexually transmitted infections (STIs) Get screened for STIs, including gonorrhea and chlamydia,  if: You are sexually active and are younger than 62 years of age. You are older than 62 years of age and your health care provider tells you that you are at risk for this type of infection. Your sexual activity has changed since you were last screened, and you are at increased risk for chlamydia or gonorrhea. Ask your health care provider if you are at risk. Ask your health care provider about whether you are at high risk for HIV. Your health care provider may recommend a prescription medicine to help prevent HIV infection. If you choose to take medicine to prevent HIV, you should first get tested for HIV. You should then be tested every 3 months for as long as you are taking the medicine. Pregnancy If you are about to stop having your period (premenopausal) and you may become pregnant, seek counseling before you get pregnant. Take 400 to 800 micrograms (mcg) of folic acid every day if you become pregnant. Ask for birth control (contraception) if you want to prevent pregnancy. Osteoporosis and menopause Osteoporosis is a disease in which the bones lose minerals and strength with aging. This can result in bone fractures. If you are 46 years old or older, or if you are at risk for osteoporosis and fractures, ask your health care provider if you should: Be screened for bone loss. Take a calcium or vitamin D supplement to lower your risk of fractures. Be given hormone replacement therapy (HRT) to treat symptoms of menopause. Follow these instructions at home: Alcohol use Do not drink alcohol if: Your health care provider tells you not to drink. You are pregnant, may be pregnant, or are planning to become pregnant. If you drink alcohol: Limit how much you have to: 0-1 drink a day. Know how much alcohol is in your drink. In the [place] one drink equals one 12 oz bottle of beer (355 mL), one 5 oz glass of wine (148 mL), or one 1 oz glass of hard liquor (44 mL). Lifestyle Do not use any products that  contain nicotine or tobacco. These products include cigarettes, chewing tobacco, and vaping devices, such as e-cigarettes. If you need help quitting, ask your health care provider. Do not use street drugs. Do not share needles. Ask your health care provider for help if you need support or information about quitting drugs. General instructions Schedule regular health, dental, and eye exams. Stay current with your vaccines. Tell your health care provider if: You often feel depressed. You have ever been abused or do not feel safe at home. Summary Adopting a healthy lifestyle and getting preventive care are important in promoting health and wellness. Follow your health care provider's instructions about healthy diet, exercising, and getting tested or screened for diseases. Follow your health care provider's instructions on monitoring your cholesterol and blood pressure. This information is not intended to replace advice given to you by your health care provider. Make sure you discuss any questions you have with your health care provider. Document Revised: 03/04/2021 Document Reviewed: 03/04/2021 Elsevier Patient Education  2024 [employer].

## 2023-10-14 NOTE — Progress Notes (Signed)
HPI: [name] is a 62 y.o. female with a PMHx significant for HTN, HLD, CKD III, chronic pain, MDD, and GAD, who is here today for her routine physical.  Last CPE: 04/07/2022  Exercise: She goes to physical therapy twice per week.  Diet: She says she is eating healthy and cooking at home. She eats vegetables daily.  Sleep: She says she has been having difficulty sleeping due to her back.  Alcohol Use: none Smoking: never Vision: UTD on routine vision care.  Dental: UTD on routine dental care.   Immunization History  Administered Date(s) Administered   Influenza, Seasonal, Injecte, Preservative Fre 07/10/2023   Influenza,inj,Quad PF,6+ Mos 08/03/2018, 08/09/2019, 08/20/2020, 07/04/2021, 07/21/2022   Influenza,inj,quad, With Preservative 07/27/2017   Influenza-Unspecified 08/07/2021   PFIZER(Purple Top)SARS-COV-2 Vaccination 12/14/2019, 01/05/2020, 07/24/2020, 02/15/2021   Pfizer Covid-19 Vaccine Bivalent Booster 62yrs & up 07/30/2022   Td 10/28/2007   Tdap 10/27/2012, 06/09/2014   Unspecified SARS-COV-2 Vaccination 08/28/2021   Zoster Recombinant(Shingrix) 08/27/2021, 03/12/2022   Health Maintenance  Topic Date Due   Cervical Cancer Screening (HPV/Pap Cotest)  Never done   COVID-19 Vaccine (7 - 2024-25 season) 06/28/2023   DTaP/Tdap/Td (4 - Td or Tdap) 06/09/2024   Medicare Annual Wellness (AWV)  10/01/2024   MAMMOGRAM  12/24/2024   Colonoscopy  09/30/2029   INFLUENZA VACCINE  Completed   Hepatitis C Screening  Completed   HIV Screening  Completed   Zoster Vaccines- Shingrix  Completed   HPV VACCINES  Aged Out   Chronic medical problems:   Hypertension:  Medications: Currently on amlodipine 5 mg daily, carvedilol 25 mg bid, Telmisartan 40 mg, hydralazine 50 mg bid, and spironolactone 50 mg daily.  She follows with cardiology twice per year.  Negative for unusual or severe headache, visual changes, exertional chest pain, dyspnea,  focal weakness, or  edema.  Lab Results  Component Value Date   CREATININE 1.12 (H) 01/06/2023   BUN 13 01/06/2023   NA 139 01/06/2023   K 4.4 01/06/2023   CL 101 01/06/2023   CO2 28 01/06/2023   Hyperlipidemia: Currently on atorvastatin 20 mg daily.  Lab Results  Component Value Date   CHOL 187 04/07/2022   HDL 61.50 04/07/2022   LDLCALC 116 (H) 04/07/2022   TRIG 48.0 04/07/2022   CHOLHDL 3 04/07/2022   Depression/anxiety:  Currently on alprazolam 0.5 mg 0.5-1 tablet nightly prn, Buspar 5 mg prn, and duloxetine 60 mg daily for back pain.  She had felt like her anxiety was well controlled, but her back pain has worsened it.   Low back pain:  She had a recent epidural injection from Dr. Yetta Barre, which she said worked for 3 days, but then the pain returned. She has another injection scheduled for 11/01/2023.  She is taking methocarbamol 500 mg 3x daily, ***muscle relaxants, amitriptyline 25 mg daily and duloxetine 30 mg daily. She was also sent a prescription for hydrocodone-acetaminophen, but she is not taking it because she feels "spaced out" while on it.  Both Dr. Yetta Barre and her physical therapist have recommended, but she is hesitant, partly because of another back surgery done in 2019.  She also complains of a new pain in her left lower leg.  No longer having neck pain.  Concerns today:   Headache:  Patient complains of occasional left frontal headache.  She says it is similar to the headache she talked about in previous visits.  She says the headache is improved by laying down.  Denies vision  changes.   She also mentions she has had some recent falls.   Review of Systems  Current Outpatient Medications on File Prior to Visit  Medication Sig Dispense Refill   acetaminophen (TYLENOL) 500 MG tablet Take 1,000 mg by mouth every 6 (six) hours as needed for headache.     ALPRAZolam (XANAX) 0.5 MG tablet Take 1/2 - 1 tablet by mouth at bedtime as needed for anxiety. 30 tablet 3   amitriptyline  (ELAVIL) 25 MG tablet Take 1 tablet (25 mg total) by mouth daily. 90 tablet 2   amLODipine (NORVASC) 5 MG tablet Take 1 tablet (5 mg total) by mouth daily. 90 tablet 2   atorvastatin (LIPITOR) 20 MG tablet Take 1 tablet (20 mg total) by mouth daily. 90 tablet 2   baclofen (LIORESAL) 10 MG tablet Take 1 tablet (10 mg total) by mouth 3 (three) times daily as needed. 60 each 3   betamethasone dipropionate 0.05 % cream Apply as directed to skin once a day to itchy areas only avoiding normal skin as needed 45 g 0   busPIRone (BUSPAR) 5 MG tablet Take 1 tablet by mouth 2  times daily. (Patient taking differently: Take 5 mg by mouth 2 (two) times daily as needed (anxiety).) 180 tablet 1   carvedilol (COREG) 25 MG tablet Take 1 tablet (25 mg total) by mouth 2 (two) times daily with a meal. 180 tablet 3   cholecalciferol (VITAMIN D3) 25 MCG (1000 UNIT) tablet Take 1,000 Units by mouth daily.     clotrimazole-betamethasone (LOTRISONE) cream APPLY TO THE AFFECTED AND SURROUNDING AREAS OF SKIN BY TOPICAL ROUTE 2 TIMES PER DAY IN THE MORNING AND EVENING FOR 2 WEEKS 45 g 0   diazepam (VALIUM) 5 MG tablet Take 1 tablet (5 mg total) by mouth  30 minutes prior to MRI as directed. 1 tablet 0   diphenhydrAMINE (BENADRYL) 25 MG tablet Take 25 mg by mouth daily as needed for itching.     DULoxetine (CYMBALTA) 30 MG capsule Take 2 capsules (60 mg total) by mouth daily. 180 capsule 3   Estradiol Acetate (FEMRING) 0.05 MG/24HR RING USE ONE RING VAGINALLY EVERY 3 MONTHS AS DIRECTED 1 each 4   gabapentin (NEURONTIN) 600 MG tablet Take 1 tablet (600 mg total) by mouth 3 (three) times daily. 90 tablet 3   hydrALAZINE (APRESOLINE) 50 MG tablet Take 1 tablet (50 mg total) by mouth in the morning AND 1 tablet (50 mg total) at bedtime. 90 tablet 2   HYDROcodone-acetaminophen (NORCO/VICODIN) 5-325 MG tablet Take 1 tablet by mouth every 6 (six) hours as needed for pain 20 tablet 0   meclizine (ANTIVERT) 25 MG tablet Take 25 mg by  mouth 3 (three) times daily as needed for dizziness.     methocarbamol (ROBAXIN) 500 MG tablet Take 1 tablet (500 mg total) by mouth 3 (three) times daily as needed for muscle spasm. 30 tablet 1   methylPREDNISolone (MEDROL DOSEPAK) 4 MG TBPK tablet Take as directed 21 tablet 0   ondansetron (ZOFRAN) 4 MG tablet Take 1 tablet (4 mg tablet) 30 - 60 minutes each prep dose 2 tablet 0   pregabalin (LYRICA) 300 MG capsule Take 1 capsule (300 mg total) by mouth 2 (two) times daily. 60 capsule 5   spironolactone (ALDACTONE) 50 MG tablet Take 1 tablet (50 mg total) by mouth daily. 90 tablet 3   telmisartan (MICARDIS) 40 MG tablet      traMADol (ULTRAM) 50 MG tablet Take 1  tablet (50 mg total) by mouth every 6 (six) hours as needed. 60 tablet 2   [DISCONTINUED] omeprazole (PRILOSEC) 40 MG capsule Take 1 capsule (40 mg total) by mouth daily. 90 capsule 1   No current facility-administered medications on file prior to visit.    Past Medical History:  Diagnosis Date   Anxiety    Aortic atherosclerosis (HCC) 04/16/2021   Atypical angina (HCC)    Back pain    related to spinal stenosis and disc problem, radiates down left buttocks to leg., weakness occ.   Chest pain    a. 03/2015 Cath: nl cors; b. 03/2021 Cor CTA: Ca2+ = 0. Nl Cors.   Chronic pain syndrome    Dyspnea    GERD (gastroesophageal reflux disease)    Grade I diastolic dysfunction    Headache    Hyperlipidemia    Hypertension    Lumbar post-laminectomy syndrome    LVH (left ventricular hypertrophy) 12/15/2020   a. 11/2020 Echo: EF 65-70%, no rwma, sev asymm LVH with IVSd 1.9 cm. No LVOT obs @ rest. Gr1 DD. Triv MR.   PONV (postoperative nausea and vomiting)    Pulmonary nodules    a. 03/2021 CT Chest: 3mm pulm nodules in bilat lower lobes. F/u 1 yr.   Right foot drop    Syncope    a. 11/2020 Zio: No significant arrhythmias.   Vaginal foreign object    "Uses Femring"    Past Surgical History:  Procedure Laterality Date   ABDOMINAL  HYSTERECTOMY     ANTERIOR CERVICAL DECOMP/DISCECTOMY FUSION N/A 01/03/2022   Procedure: Anterior Cervical Decompression Fusion - Cervical four-Cervical five - Cervical five-Cervical six;  Surgeon: Tia Alert, MD;  Location: [facility] OR;  Service: Neurosurgery;  Laterality: N/A;   BIOPSY  12/16/2020   Procedure: BIOPSY;  Surgeon: Meryl Dare, MD;  Location: [facility] ENDOSCOPY;  Service: Endoscopy;;   CARDIAC CATHETERIZATION N/A 04/18/2015   Procedure: Left Heart Cath and Coronary Angiography;  Surgeon: Rinaldo Cloud, MD;  Location: [facility] INVASIVE CV LAB;  Service: Cardiovascular;  Laterality: N/A;   COLONOSCOPY     COLONOSCOPY W/ BIOPSIES AND POLYPECTOMY  2018   ESOPHAGOGASTRODUODENOSCOPY (EGD) WITH PROPOFOL N/A 12/16/2020   Procedure: ESOPHAGOGASTRODUODENOSCOPY (EGD) WITH PROPOFOL;  Surgeon: Meryl Dare, MD;  Location[facility] END[facility]COPY;  Service: Endoscopy;  Laterality: N/A;   FOOT SURGERY Bilateral    [facility] "bunion,bone spur, tendon" (1) -6'16, (1)-10'16   HEMATOMA EVACUATION N/A 01/05/2022   Procedure: Cervical Wound Exploration;  Surgeon: Coletta Memos, MD;  Location: [facility] OR;  Service: Neurosurgery;  Laterality: N/A;   IR EPIDUROGRAPHY  07/21/2018   LUMBAR LAMINECTOMY/DECOMPRESSION MICRODISCECTOMY Bilateral 12/28/2015   Procedure: MICRO LUMBAR DECOMPRESSION L4 - L5 BILATERALLY;  Surgeon: Jene Every, MD;  Location: [facility] ORS;  Service: Orthopedics;  Laterality: Bilateral;   LUMBAR LAMINECTOMY/DECOMPRESSION MICRODISCECTOMY Bilateral 03/04/2018   Procedure: Revision of Microlumbar Decompression Bilateral Lumbar Four-Five;  Surgeon: Jene Every, MD;  Location: [facility] OR;  Service: Orthopedics;  Laterality: Bilateral;  90 mins   SAVORY DILATION N/A 12/16/2020   Procedure: SAVORY DILATION;  Surgeon: Meryl Dare, MD;  Location: [facility] ENDOSCOPY;  Service: Endoscopy;  Laterality: N/A;   SPINAL CORD STIMULATOR INSERTION N/A 09/28/2019   Procedure: THORACIC SPINAL CORD STIMULATOR INSERTION;   Surgeon: Venita Lick, MD;  Location: [facility] OR;  Service: Orthopedics;  Laterality: N/A;  2.5 hrs   SPINAL CORD STIMULATOR REMOVAL N/A 05/27/2021   Procedure: LUMBAR SPINAL CORD STIMULATOR REMOVAL;  Surgeon: Lucy Chris, MD;  Location: [facility] ORS;  Service: Neurosurgery;  Laterality: N/A;   TUBAL LIGATION     WISDOM TOOTH EXTRACTION     WOUND EXPLORATION N/A 03/04/2018   Procedure: EXPLORATION OF LUMBAR DECOMPRESSION WOUND;  Surgeon: Jene Every, MD;  Location: [facility] OR;  Service: Orthopedics;  Laterality: N/A;    Allergies  Allergen Reactions   Cephalosporins Anaphylaxis   Penicillins Anaphylaxis and Hives    Did it involve swelling of the face/tongue/throat, SOB, or low BP? Yes  Did it involve sudden or severe rash/hives, skin peeling, or any reaction on the inside of your mouth or nose? Yes  Did you need to seek medical attention at a hospital or doctor's office? Yes  When did it last happen?      10+ years  If all above answers are "NO", may proceed with cephalosporin use.  Did it involve swelling of the face/tongue/throat, SOB, or low BP? Yes Did it involve sudden or severe rash/hives, skin peeling, or any reaction on the inside of your mouth or nose? Yes Did you need to seek medical attention at a hospital or doctor's office? Yes When did it last happen?      10+ years If all above answers are "NO", may proceed with cephalosporin use.   Anesthetics, Amide Nausea And Vomiting    Does not know name of it. States they [facility].    Betadine [Povidone Iodine] Other (See Comments)    "Skin Burn" caused scar on Left buttock   Latex Hives, Itching, Rash and Other (See Comments)   Peach [Prunus Persica] Hives   Claritin [Loratadine]     Rash, pruritis     Family History  Problem Relation Age of Onset   Heart attack Mother    Lung cancer Father    Cancer Father    Pancreatic cancer Sister    Breast cancer Sister 27   Multiple myeloma Sister    Breast cancer  Sister        diagnosed in her 33's   Heart attack Sister    Throat cancer Brother    Lung cancer Brother    Stomach cancer Cousin    Colon cancer Neg Hx    Colon polyps Neg Hx    Esophageal cancer Neg Hx    Rectal cancer Neg Hx     Social History   Socioeconomic History   Marital status: Married    Spouse name: Soil scientist   Number of children: 1   Years of education: Not on file   Highest education level: Not on file  Occupational History   Not on file  Tobacco Use   Smoking status: Never   Smokeless tobacco: Never  Vaping Use   Vaping status: Never Used  Substance and Sexual Activity   Alcohol use: Never   Drug use: Never   Sexual activity: Yes    Partners: Male    Birth control/protection: Post-menopausal, Surgical  Other Topics Concern   Not on file  Social History Narrative   Not on file   Social Drivers of Health   Financial Resource Strain: Low Risk  (10/02/2023)   Overall Financial Resource Strain (CARDIA)    Difficulty of Paying Living Expenses: Not hard at all  Food Insecurity: No Food Insecurity (10/02/2023)   Hunger Vital Sign    Worried About Running Out of Food in the Last Year: Never true    Ran Out of Food in the Last Year: Never true  Transportation  Needs: No Transportation Needs (10/02/2023)   PRAPARE - Administrator, Civil Service (Medical): No    Lack of Transportation (Non-Medical): No  Physical Activity: Insufficiently Active (10/02/2023)   Exercise Vital Sign    Days of Exercise per Week: 2 days    Minutes of Exercise per Session: 60 min  Stress: No Stress Concern Present (10/02/2023)   [employer] of Occupational Health - Occupational Stress Questionnaire    Feeling of Stress : Not at all  Social Connections: Socially Integrated (10/02/2023)   Social Connection and Isolation Panel [NHANES]    Frequency of Communication with Friends and Family: More than three times a week    Frequency of Social Gatherings with Friends  and Family: More than three times a week    Attends Religious Services: More than 4 times per year    Active Member of [employer] or Organizations: Yes    Attends Banker Meetings: More than 4 times per year    Marital Status: Married   Today's Vitals   10/14/23 0915  BP: 128/80  Pulse: 91  Resp: 16  SpO2: 98%  Height: 5\' 6"  (1.676 m)   Body mass index is 27.28 kg/m.   Wt Readings from Last 3 Encounters:  10/02/23 169 lb (76.7 kg)  09/30/23 169 lb (76.7 kg)  08/12/23 170 lb (77.1 kg)    Physical Exam Vitals and nursing note reviewed.  Constitutional:      General: She is not in acute distress.    Appearance: She is well-developed.  HENT:     Head: Normocephalic and atraumatic.     Right Ear: Hearing, tympanic membrane, ear canal and external ear normal.     Left Ear: Hearing, tympanic membrane, ear canal and external ear normal.     Ears:     Comments: ***    Mouth/Throat:     Mouth: Mucous membranes are moist.     Pharynx: Oropharynx is clear. Uvula midline.  Eyes:     Extraocular Movements: Extraocular movements intact.     Conjunctiva/sclera: Conjunctivae normal.     Pupils: Pupils are equal, round, and reactive to light.  Neck:     Thyroid: No thyroid mass or thyromegaly.     Trachea: No tracheal deviation.  Cardiovascular:     Rate and Rhythm: Normal rate and regular rhythm.     Pulses:          Dorsalis pedis pulses are 2+ on the right side and 2+ on the left side.       Posterior tibial pulses are 2+ on the right side and 2+ on the left side.     Heart sounds: No murmur heard. Pulmonary:     Effort: Pulmonary effort is normal. No respiratory distress.     Breath sounds: Normal breath sounds.  Abdominal:     Palpations: Abdomen is soft. There is no mass.     Tenderness: There is no abdominal tenderness.  Genitourinary:    Comments: Deferred to gyn. Musculoskeletal:     Comments: No major deformity or signs of synovitis appreciated.   Lymphadenopathy:     Cervical: No cervical adenopathy.     Upper Body:     Right upper body: No supraclavicular adenopathy.     Left upper body: No supraclavicular adenopathy.  Skin:    General: Skin is warm.     Findings: No erythema or rash.  Neurological:     General: No focal deficit present.  Mental Status: She is alert and oriented to person, place, and time.     Cranial Nerves: No cranial nerve deficit.     Coordination: Coordination normal.     Gait: Gait normal.     Deep Tendon Reflexes:     Reflex Scores:      Bicep reflexes are 2+ on the right side and 2+ on the left side.    Comments: Gait unstable, assisted by a cane. Right foot drop with hard brace.  Psychiatric:        Mood and Affect: Affect normal. Mood is anxious.     ASSESSMENT AND PLAN:  [name] was here today for her annual physical examination.  Routine general medical examination at a health care facility  Essential hypertension -     Comprehensive metabolic panel; Future  Stage 3a chronic kidney disease (HCC) -     Comprehensive metabolic panel; Future -     VITAMIN D 25 Hydroxy (Vit-D Deficiency, Fractures); Future -     Microalbumin / creatinine urine ratio; Future  Hyperlipidemia, unspecified hyperlipidemia type -     Comprehensive metabolic panel; Future -     Lipid panel; Future     There are no diagnoses linked to this encounter. Return in 6 months (on 04/13/2024) for chronic problems.   I, Suanne Marker, acting as a scribe for Lennyx Verdell [place], MD., have documented all relevant documentation on the behalf of Tovia Kisner [place], MD, as directed by  Xochil Shanker [place], MD while in the presence of Handy Mcloud [place], MD.   I, Suanne Marker, have reviewed all documentation for this visit. The documentation on 10/14/23 for the exam, diagnosis, procedures, and orders are all accurate and complete.  Dan Dissinger G. [place], MD  [facility] [place] office.

## 2023-10-15 DIAGNOSIS — Z6828 Body mass index (BMI) 28.0-28.9, adult: Secondary | ICD-10-CM | POA: Diagnosis not present

## 2023-10-15 DIAGNOSIS — Z9889 Other specified postprocedural states: Secondary | ICD-10-CM | POA: Diagnosis not present

## 2023-10-15 DIAGNOSIS — G5603 Carpal tunnel syndrome, bilateral upper limbs: Secondary | ICD-10-CM | POA: Diagnosis not present

## 2023-10-15 DIAGNOSIS — M65312 Trigger thumb, left thumb: Secondary | ICD-10-CM | POA: Diagnosis not present

## 2023-10-15 DIAGNOSIS — M5416 Radiculopathy, lumbar region: Secondary | ICD-10-CM | POA: Diagnosis not present

## 2023-10-15 NOTE — Assessment & Plan Note (Signed)
LDL 116 in 03/2022. Continue Atorvastatin 20 mg daily and low fat diet. Further recommendations according to lipid panel result.

## 2023-10-15 NOTE — Assessment & Plan Note (Signed)
Stable, aggravated by chronic health problems specially chronic pain. Currently on Duloxetine 60 mg daily.

## 2023-10-15 NOTE — Assessment & Plan Note (Signed)
She is not taking Buspar 5 mg daily but rather prn because problem improved. Recommend that if anxiety exacerbates again resume Buspar 5 mg bid. On Duloxetine 60 mg daily. Continue Alprazolam 0.5 mg 05-1 tab daily prn. F/U in 6 months.

## 2023-10-15 NOTE — Assessment & Plan Note (Signed)
Lumbar radiculopathy with right foot drop. Follows with ortho and pain management. Currently doing PT.

## 2023-10-15 NOTE — Assessment & Plan Note (Signed)
Problem has been stable. Continue adequate hydration, low salt diet,and avoidance of NSAID's. Currently on Telmisartan 40 mg daily.

## 2023-10-16 ENCOUNTER — Other Ambulatory Visit: Payer: Self-pay | Admitting: Family Medicine

## 2023-10-16 ENCOUNTER — Other Ambulatory Visit (HOSPITAL_COMMUNITY): Payer: Self-pay

## 2023-10-16 DIAGNOSIS — F419 Anxiety disorder, unspecified: Secondary | ICD-10-CM

## 2023-10-19 ENCOUNTER — Other Ambulatory Visit (HOSPITAL_COMMUNITY): Payer: Self-pay

## 2023-10-19 MED ORDER — ALPRAZOLAM 0.5 MG PO TABS
0.2500 mg | ORAL_TABLET | Freq: Every evening | ORAL | 2 refills | Status: DC | PRN
  Filled 2023-10-19: qty 30, 30d supply, fill #0
  Filled 2023-12-30: qty 30, 30d supply, fill #1
  Filled 2024-02-12: qty 30, 30d supply, fill #2

## 2023-10-26 ENCOUNTER — Other Ambulatory Visit (HOSPITAL_COMMUNITY): Payer: Self-pay

## 2023-10-27 ENCOUNTER — Ambulatory Visit: Payer: PPO | Admitting: Physical Therapy

## 2023-10-27 ENCOUNTER — Encounter: Payer: Self-pay | Admitting: Physical Therapy

## 2023-10-27 DIAGNOSIS — R2689 Other abnormalities of gait and mobility: Secondary | ICD-10-CM | POA: Diagnosis not present

## 2023-10-27 DIAGNOSIS — M6281 Muscle weakness (generalized): Secondary | ICD-10-CM

## 2023-10-27 NOTE — Therapy (Addendum)
 OUTPATIENT PHYSICAL THERAPY NEURO TREATMENT NOTE      Patient Name: [name] MRN: [id number] DOB:04/05/1961, 62 y.o., female Today's Date: 10/28/2023   PCP: Jordan, [name]., MD REFERRING PROVIDER: Babs Arthea DASEN, MD  END OF SESSION:  PT End of Session - 10/27/23 1839     Visit Number 33    Number of Visits 38    Date for PT Re-Evaluation 11/13/23    Authorization Type [employer]    Authorization Time Period 04-21-23 - 06-27-23;  06-25-23 - 08-27-23;  09-01-23 - 10-27-23    Progress Note Due on Visit 40    PT Start Time 0925    PT Stop Time 1015    PT Time Calculation (min) 50 min    Equipment Utilized During Treatment Gait belt    Activity Tolerance Patient limited by pain   limited by RLE weakness   Behavior During Therapy [facility] for tasks assessed/performed                                          Past Medical History:  Diagnosis Date   Anxiety    Aortic atherosclerosis (HCC) 04/16/2021   Atypical angina (HCC)    Back pain    related to spinal stenosis and disc problem, radiates down left buttocks to leg., weakness occ.   Chest pain    a. 03/2015 Cath: nl cors; b. 03/2021 Cor CTA: Ca2+ = 0. Nl Cors.   Chronic pain syndrome    Dyspnea    GERD (gastroesophageal reflux disease)    Grade I diastolic dysfunction    Headache    Hyperlipidemia    Hypertension    Lumbar post-laminectomy syndrome    LVH (left ventricular hypertrophy) 12/15/2020   a. 11/2020 Echo: EF 65-70%, no rwma, sev asymm LVH with IVSd 1.9 cm. No LVOT obs @ rest. Gr1 DD. Triv MR.   PONV (postoperative nausea and vomiting)    Pulmonary nodules    a. 03/2021 CT Chest: 3mm pulm nodules in bilat lower lobes. F/u 1 yr.   Right foot drop    Syncope    a. 11/2020 Zio: No significant arrhythmias.   Vaginal foreign object    Uses Femring    Past Surgical History:  Procedure Laterality Date   ABDOMINAL HYSTERECTOMY     ANTERIOR CERVICAL DECOMP/DISCECTOMY  FUSION N/A 01/03/2022   Procedure: Anterior Cervical Decompression Fusion - Cervical four-Cervical five - Cervical five-Cervical six;  Surgeon: Joshua Alm RAMAN, MD;  Location: [facility] OR;  Service: Neurosurgery;  Laterality: N/A;   BIOPSY  12/16/2020   Procedure: BIOPSY;  Surgeon: Aneita Gwendlyn DASEN, MD;  Location: [facility] ENDOSCOPY;  Service: Endoscopy;;   CARDIAC CATHETERIZATION N/A 04/18/2015   Procedure: Left Heart Cath and Coronary Angiography;  Surgeon: Rober Chroman, MD;  Location: [facility] INVASIVE CV LAB;  Service: Cardiovascular;  Laterality: N/A;   COLONOSCOPY     COLONOSCOPY W/ BIOPSIES AND POLYPECTOMY  2018   ESOPHAGOGASTRODUODENOSCOPY (EGD) WITH PROPOFOL  N/A 12/16/2020   Procedure: ESOPHAGOGASTRODUODENOSCOPY (EGD) WITH PROPOFOL ;  Surgeon: Aneita Gwendlyn DASEN, MD;  Location: [facility] ENDOSCOPY;  Service: Endoscopy;  Laterality: N/A;   FOOT SURGERY Bilateral    [facility] bunion,bone spur, tendon (1) -6'16, (1)-10'16   HEMATOMA EVACUATION N/A 01/05/2022   Procedure: Cervical Wound Exploration;  Surgeon: Gillie Duncans, MD;  Location: [facility] OR;  Service: Neurosurgery;  Laterality: N/A;   IR EPIDUROGRAPHY  07/21/2018   LUMBAR LAMINECTOMY/DECOMPRESSION MICRODISCECTOMY Bilateral 12/28/2015   Procedure: MICRO LUMBAR DECOMPRESSION L4 - L5 BILATERALLY;  Surgeon: Reyes Billing, MD;  Location: [facility] ORS;  Service: Orthopedics;  Laterality: Bilateral;   LUMBAR LAMINECTOMY/DECOMPRESSION MICRODISCECTOMY Bilateral 03/04/2018   Procedure: Revision of Microlumbar Decompression Bilateral Lumbar Four-Five;  Surgeon: Billing Reyes, MD;  Location: [facility] OR;  Service: Orthopedics;  Laterality: Bilateral;  90 mins   SAVORY DILATION N/A 12/16/2020   Procedure: SAVORY DILATION;  Surgeon: Aneita Gwendlyn DASEN, MD;  Location: [facility] ENDOSCOPY;  Service: Endoscopy;  Laterality: N/A;   SPINAL CORD STIMULATOR INSERTION N/A 09/28/2019   Procedure: THORACIC SPINAL CORD STIMULATOR INSERTION;  Surgeon: Burnetta Aures, MD;  Location: [facility] OR;  Service:  Orthopedics;  Laterality: N/A;  2.5 hrs   SPINAL CORD STIMULATOR REMOVAL N/A 05/27/2021   Procedure: LUMBAR SPINAL CORD STIMULATOR REMOVAL;  Surgeon: Bluford Standing, MD;  Location: [facility] ORS;  Service: Neurosurgery;  Laterality: N/A;   TUBAL LIGATION     WISDOM TOOTH EXTRACTION     WOUND EXPLORATION N/A 03/04/2018   Procedure: EXPLORATION OF LUMBAR DECOMPRESSION WOUND;  Surgeon: Billing Reyes, MD;  Location: [facility] OR;  Service: Orthopedics;  Laterality: N/A;   Patient Active Problem List   Diagnosis Date Noted   Pain in thumb joint with movement of right hand 08/12/2023   Localized primary osteoarthritis of carpometacarpal joint of right thumb 07/29/2023   Right foot drop 07/29/2023   Frequent falls 04/14/2023   Gastroesophageal reflux disease 04/14/2023   Right upper extremity numbness 09/29/2022   Stage 3a chronic kidney disease (HCC) 09/29/2022   Cervical spondylosis with radiculopathy 01/05/2022   S/P cervical spinal fusion 01/03/2022   Cervical disc disorder with radiculopathy of cervical region 11/27/2021   Uncontrolled pain 05/27/2021   Syncope 05/13/2021   Depression with anxiety 05/13/2021   Chronic diastolic CHF (congestive heart failure) (HCC) 05/13/2021   Chronic back pain 05/13/2021   Chest tightness 05/13/2021   Lower abdominal pain 05/13/2021   Syncope and collapse 01/01/2021   Abnormal echocardiogram 01/01/2021   BPPV (benign paroxysmal positional vertigo), left 12/26/2020   Odynophagia    Dysphagia 12/15/2020   Postural dizziness with presyncope 12/14/2020   Lumbar post-laminectomy syndrome    Chronic pain 09/28/2019   Lumbar spinal stenosis    Radicular pain    Gait disorder    Difficulty in urination 07/13/2018   Back pain 07/12/2018   Hyperlipidemia 04/16/2018   GAD (generalized anxiety disorder) 04/16/2018   AKI (acute kidney injury) (HCC)    Benign essential HTN    Major depression, recurrent (HCC)    Lumbar radiculopathy 03/09/2018   Paraparesis (HCC)  03/09/2018   Essential hypertension    Chronic nonintractable headache    Leukocytosis    Acute blood loss anemia    Postoperative pain    Generalized weakness    Spinal stenosis at L4-L5 level 03/04/2018   Spinal stenosis of lumbar region 12/28/2015   Chest pain 04/16/2015    ONSET DATE: Referral date 04-01-23  REFERRING DIAG: M54.16 (ICD-10-CM) - Lumbar radiculopathy  THERAPY DIAG:  Muscle weakness (generalized) - Plan: PT plan of care cert/re-cert  Other abnormalities of gait and mobility - Plan: PT plan of care cert/re-cert  Rationale for Evaluation and Treatment: Rehabilitation  SUBJECTIVE:  SUBJECTIVE STATEMENT:  Pt went to beach week of Christmas - says she had a lot of back pain - forgot to take her heating pad with her.  Has appt next Thursday, Jan. 9th, with neurosurgeon - is going to discuss having the back surgery because the pain is now affecting her quality of life  Pt accompanied by: self  PERTINENT HISTORY: Frequent falls:  Spinal cord stimulator insertion on 09-28-19; stimulator removal on 05-27-21 with resultant LLE weakness;  decompression and laminectomy on 12/28/15 at L4-L5  with redo decompression surgery on May 9,2019, post-op paraparesis with surgery to remove small hematoma; 07/12/18 fall at work (involved rolling chair) with hospital admission, transfer to CIR with d/c home 08/03/18; HTN, Bil foot surgery; left ventricular hypertrophy, chronic pain syndrome, dyspnea, hypertrophic cardiomyopathy; cervical fusion March 2023;  had carpal tunnel surgery RUE in April 2024:  LVH  PAIN:  10-27-23  Are you having pain? Intensity: 5/10 in low back:   does not pay attention to the pain in my hand Location:  low back, Rt hand Description: soreness in low back;  radiates down Rt lateral  leg to the knee:  burning sensation in Rt hand Duration; varies depending on activity or movement being performed - worse with bending over or pulling leg up to chest Aggravating factors - certain movements; unable to turn or rotate Alleviating factor - heating pad helps some; pain medication  PRECAUTIONS: Fall  WEIGHT BEARING RESTRICTIONS: No  FALLS: Has patient fallen in last 6 months? Yes. Number of falls 3  LIVING ENVIRONMENT: Lives with: lives with their spouse Lives in: House/apartment Stairs: Yes: External: 5 steps; on right going up Has following equipment at home: Single point cane, Wheelchair (manual), and [place] crutch  PLOF: Independent with basic ADLs, Independent with household mobility with device, Independent with community mobility with device, and Independent with transfers  PATIENT GOALS: improve strength Rt leg  OBJECTIVE:   DIAGNOSTIC FINDINGS: IMPRESSION: 1. Interval anterior discectomy and fusion from C4 through C6 with improved patency of the spinal canal and only mild residual foraminal narrowing. 2. Stable mild spinal stenosis and mild to moderate left foraminal narrowing at C3-4. 3. Stable mild spinal stenosis and mild foraminal narrowing bilaterally at C6-7. Mild foraminal narrowing bilaterally at C7-T1. 4. No cord deformity or abnormal cord signal.  No acute findings. No recent MRI of lumbar region  LOWER EXTREMITY MMT:    MMT Right Eval Left Eval  Hip flexion 3+   Hip extension    Hip abduction    Hip adduction    Hip internal rotation    Hip external rotation    Knee flexion 3-   Knee extension 3-   Ankle dorsiflexion 2-   Ankle plantarflexion    Ankle inversion    Ankle eversion    (Blank rows = not tested)   Today's Treatment: 10-27-23  There were no vitals filed for this visit.    GAIT: Gait pattern: decreased step length- Right, decreased stance time- Right, decreased hip/knee flexion- Right, and decreased ankle  dorsiflexion- Right Distance walked: 40' x 2 reps (to & from Scifit from 1st mat table) Assistive device utilized: rollator  Level of assistance: SBA Comments: blue rocker AFO donned on RLE      05-07-23: Gait velocity: with [place] Walk on AFO - 118 secs with rollator = .42 ft/sec   With Toe off AFO 31.9 secs with rollator = 1.03 ft/sec  Gait velocity = 32.62 secs = 1.01 ft/sec with RW  THEREX:  10-27-23  Pelvic tilt 5 sec hold x 10 reps Glut isometrics  10 reps, 5 sec hold Rt Hip flexion in hooklying 10 reps - assisted Assisted RLE heel slides 5 reps - small ROM Bil. Hip abdct. In hooklying 10 reps Small range trunk rotation - 10 reps - small range to each side with approx. 4 sec hold  SciFit level 2.5 x 10 with bil. Ue's and LE's - pt reported no back pain with this exercise  TUG score 30.78 secs with RW  Gait velocity = 32.62 secs = 1.01 ft/sec with RW   HEP - [employer]  Access Code: [email or web address] URL: [email or web address] Date: 05/06/2023 Prepared by: Rock Kussmaul  Exercises - Beginner Bridge  - 1 x daily - 7 x weekly - 1 sets - 10 reps - 3 hold - Supine Heel Slide  - 1 x daily - 7 x weekly - 1 sets - 10 reps - Bent Knee Fallouts  - 1 x daily - 7 x weekly - 1 sets - 10 reps - Sidelying Hip Abduction  - 1 x daily - 7 x weekly - 1 sets - 10 reps - Seated Knee Flexion Extension AROM   - 1 x daily - 7 x weekly - 1 sets - 10 reps   PATIENT EDUCATION: Education details:  cont with HEP Person educated: Patient Education method: Explanation, demonstration, handouts  Education comprehension: verbalized understanding, demonstrated understanding  HOME EXERCISE PROGRAM: To be issued  GOALS: Goals reviewed with patient? Yes          NEW LONG TERM GOALS:  Target date:  08-21-23    NEW TARGET DATE:  10-09-23     1.  Amb. 300' with RW with blue rocker AFO on RLE with SBA without requiring seated rest period.    Baseline;  180', 61' with  rollator;  1' with rollator -- 09-01-23    Status:  Not met 10-27-23 due to back pain  2.  Improve TUG score from 22.25 secs to </= 15 secs with use of rollator.  REVISED:  score </= 19 secs  Baseline:  22.25 secs with rollator & blue rocker AFO on 06-25-23; 30.78 secs with RW  Status:  Not met 10-27-23   3.  Pt will report ability to negotiate steps in her garage at home modified independently for safely entering & exiting home.  Baseline; Goal met 09-01-23  Status:  MET  4.  Increase gait velocity to >/= 1.5 ft/sec with use of AFO & rollator for increased gait efficiency.  Baseline:  With Toe off AFO 31.9 secs with rollator = 1.03 ft/sec    Status:  Not met = 1.01 ft/sec with RW with blue rocker AFO on RLE  5.  Independent in updated HEP for RLE strengthening.  Baseline:  Goal status: Goal met 08-25-23   REVISED LTG's:  TARGET DATE 10-09-23  1.  Amb. 300' with RW with blue rocker AFO on RLE with SBA without requiring seated rest period.    Baseline;  180', 71' with rollator;  84' with rollator -- 09-01-23    Status:  Deferred due to severity of back pain - 10-05-23:  Not met 10-27-23 due to back pain  2.  Improve TUG score from 22.25 secs to </= 15 secs with use of rollator.  REVISED:  score </= 19 secs  Baseline:  22.25 secs with rollator & blue rocker AFO on 06-25-23;  30.78 secs with RW  Status:  Not met 10-27-23  3.  Pt will report ability to negotiate steps in her garage at home modified independently for safely entering & exiting home.  Baseline;   Status:  Goal met 09-01-23  4.  Increase gait velocity to >/= 1.5 ft/sec with use of AFO & rollator for increased gait efficiency.  Baseline:  With Toe off AFO 31.9 secs with rollator = 1.03 ft/sec :  10-05-23 - 59.31 secs with use of rollator = .55 ft/sec with use of rollator;  1.01 ft/sec with RW with blue rocker AFO on RLE    Status: Goal not met 10-27-23  5.  Independent in updated HEP for RLE strengthening.  Baseline:  Goal  status: Goal met 08-25-23   NEW LTG's:  TARGET DATE 11-20-23  1.  Amb. 230' with RW with blue rocker AFO on RLE with SBA without requiring seated rest period.    Baseline;  180', 62' with rollator;  80' with rollator -- 09-01-23    Status:  Deferred due to severity of back pain - 10-05-23:  Not met 10-27-23 due to back pain - REVISED  2.  Improve TUG score from 30.78 secs to </= 26 secs with use of rollator.   Baseline:  22.25 secs with rollator & blue rocker AFO on 06-25-23;  30.78 secs with RW  Status:  Revised   3. Increase gait velocity to >/= 1.3 ft/sec with use of AFO & rollator for increased gait efficiency. Baseline:  With Toe off AFO 31.9 secs with rollator = 1.03 ft/sec :  10-05-23 - 59.31 secs with use of rollator = .55 ft/sec with use of rollator;  1.01 ft/sec with RW with blue rocker AFO on RLE   Status: REVISED  4.  Report decreased back pain to </= 4/10 for increased comfort with mobility and ADL's. Baseline: 5/10 on 10-27-23 Goal status: Goal met 08-25-23    ASSESSMENT:  CLINICAL IMPRESSION: PT session focused on assessment of LTG's for renewal as pt reports MD has recommended she continue with PT until back surgery is scheduled.  PT session also focused on gentle low back stretching & strengthening exercises without increased resistance due to c/o low back pain.   Pt continues to report moderate back pain at 5/10 intensity at start of today's session.  Pt has only met LTG #3 with no other goals met due to increased back pain which started in late November.  Pt reports she is planning on having back surgery due to severity of pain - has appt on 11-05-23 with neurosurgeon to discuss surgery.  Cont with POC.    OBJECTIVE IMPAIRMENTS: Abnormal gait, decreased balance, decreased coordination, decreased endurance, decreased strength, and impaired tone.   ACTIVITY LIMITATIONS: carrying, bending, squatting, stairs, transfers, and locomotion level  PARTICIPATION LIMITATIONS:  cleaning, laundry, shopping, and community activity  PERSONAL FACTORS: Behavior pattern, Past/current experiences, and Time since onset of injury/illness/exacerbation are also affecting patient's functional outcome.   REHAB POTENTIAL: Good  CLINICAL DECISION MAKING: Evolving/moderate complexity  EVALUATION COMPLEXITY: Moderate  PLAN:  PT FREQUENCY: 2x/week  PT DURATION: 4 weeks  PLANNED INTERVENTIONS: Therapeutic exercises, Therapeutic activity, Neuromuscular re-education, Balance training, Gait training, Patient/Family education, Self Care, Stair training, Orthotic/Fit training, DME instructions, and Aquatic Therapy  PLAN FOR NEXT SESSION:  continue RLE strengthening   [place]anna [place], PT 10/28/2023, 5:50 PM

## 2023-10-28 NOTE — Addendum Note (Signed)
 Addended by: Althea Charon on: 10/28/2023 05:50 PM   Modules accepted: Orders

## 2023-10-29 ENCOUNTER — Ambulatory Visit: Payer: PPO | Attending: Family Medicine | Admitting: Physical Therapy

## 2023-10-29 ENCOUNTER — Encounter: Payer: Self-pay | Admitting: Physical Therapy

## 2023-10-29 DIAGNOSIS — R2689 Other abnormalities of gait and mobility: Secondary | ICD-10-CM | POA: Diagnosis not present

## 2023-10-29 DIAGNOSIS — M6281 Muscle weakness (generalized): Secondary | ICD-10-CM | POA: Insufficient documentation

## 2023-10-29 NOTE — Therapy (Signed)
 OUTPATIENT PHYSICAL THERAPY NEURO TREATMENT NOTE      Patient Name: [name] MRN: [id number] DOB:[birth year], 63 y.o., female Today's Date: 10/29/2023   PCP: Jordan, [name]., MD REFERRING PROVIDER: Babs Arthea DASEN, MD  END OF SESSION:  PT End of Session - 10/29/23 1942     Visit Number 34    Number of Visits 38    Date for PT Re-Evaluation 11/13/23    Authorization Type [employer]    Authorization Time Period 04-21-23 - 06-27-23;  06-25-23 - 08-27-23;  09-01-23 - 10-27-23    Progress Note Due on Visit 40    PT Start Time 0934    PT Stop Time 1021    PT Time Calculation (min) 47 min    Equipment Utilized During Treatment Gait belt    Activity Tolerance Patient limited by pain   limited by RLE weakness   Behavior During Therapy [facility] for tasks assessed/performed                                           Past Medical History:  Diagnosis Date   Anxiety    Aortic atherosclerosis (HCC) 04/16/2021   Atypical angina (HCC)    Back pain    related to spinal stenosis and disc problem, radiates down left buttocks to leg., weakness occ.   Chest pain    a. 03/2015 Cath: nl cors; b. 03/2021 Cor CTA: Ca2+ = 0. Nl Cors.   Chronic pain syndrome    Dyspnea    GERD (gastroesophageal reflux disease)    Grade I diastolic dysfunction    Headache    Hyperlipidemia    Hypertension    Lumbar post-laminectomy syndrome    LVH (left ventricular hypertrophy) 12/15/2020   a. 11/2020 Echo: EF 65-70%, no rwma, sev asymm LVH with IVSd 1.9 cm. No LVOT obs @ rest. Gr1 DD. Triv MR.   PONV (postoperative nausea and vomiting)    Pulmonary nodules    a. 03/2021 CT Chest: 3mm pulm nodules in bilat lower lobes. F/u 1 yr.   Right foot drop    Syncope    a. 11/2020 Zio: No significant arrhythmias.   Vaginal foreign object    Uses Femring    Past Surgical History:  Procedure Laterality Date   ABDOMINAL HYSTERECTOMY     ANTERIOR CERVICAL DECOMP/DISCECTOMY  FUSION N/A 01/03/2022   Procedure: Anterior Cervical Decompression Fusion - Cervical four-Cervical five - Cervical five-Cervical six;  Surgeon: Joshua Alm RAMAN, MD;  Location: [facility] OR;  Service: Neurosurgery;  Laterality: N/A;   BIOPSY  12/16/2020   Procedure: BIOPSY;  Surgeon: Aneita Gwendlyn DASEN, MD;  Location: [facility] ENDOSCOPY;  Service: Endoscopy;;   CARDIAC CATHETERIZATION N/A 04/18/2015   Procedure: Left Heart Cath and Coronary Angiography;  Surgeon: Rober Chroman, MD;  Location: [facility] INVASIVE CV LAB;  Service: Cardiovascular;  Laterality: N/A;   COLONOSCOPY     COLONOSCOPY W/ BIOPSIES AND POLYPECTOMY  2018   ESOPHAGOGASTRODUODENOSCOPY (EGD) WITH PROPOFOL  N/A 12/16/2020   Procedure: ESOPHAGOGASTRODUODENOSCOPY (EGD) WITH PROPOFOL ;  Surgeon: Aneita Gwendlyn DASEN, MD;  Location: [facility] ENDOSCOPY;  Service: Endoscopy;  Laterality: N/A;   FOOT SURGERY Bilateral    [facility] bunion,bone spur, tendon (1) -6'16, (1)-10'16   HEMATOMA EVACUATION N/A 01/05/2022   Procedure: Cervical Wound Exploration;  Surgeon: Gillie Duncans, MD;  Location: [facility] OR;  Service: Neurosurgery;  Laterality: N/A;   IR  EPIDUROGRAPHY  07/21/2018   LUMBAR LAMINECTOMY/DECOMPRESSION MICRODISCECTOMY Bilateral 12/28/2015   Procedure: MICRO LUMBAR DECOMPRESSION L4 - L5 BILATERALLY;  Surgeon: Reyes Billing, MD;  Location: [facility] ORS;  Service: Orthopedics;  Laterality: Bilateral;   LUMBAR LAMINECTOMY/DECOMPRESSION MICRODISCECTOMY Bilateral 03/04/2018   Procedure: Revision of Microlumbar Decompression Bilateral Lumbar Four-Five;  Surgeon: Billing Reyes, MD;  Location: [facility] OR;  Service: Orthopedics;  Laterality: Bilateral;  90 mins   SAVORY DILATION N/A 12/16/2020   Procedure: SAVORY DILATION;  Surgeon: Aneita Gwendlyn DASEN, MD;  Location: [facility] ENDOSCOPY;  Service: Endoscopy;  Laterality: N/A;   SPINAL CORD STIMULATOR INSERTION N/A 09/28/2019   Procedure: THORACIC SPINAL CORD STIMULATOR INSERTION;  Surgeon: Burnetta Aures, MD;  Location: [facility] OR;  Service:  Orthopedics;  Laterality: N/A;  2.5 hrs   SPINAL CORD STIMULATOR REMOVAL N/A 05/27/2021   Procedure: LUMBAR SPINAL CORD STIMULATOR REMOVAL;  Surgeon: Bluford Standing, MD;  Location: [facility] ORS;  Service: Neurosurgery;  Laterality: N/A;   TUBAL LIGATION     WISDOM TOOTH EXTRACTION     WOUND EXPLORATION N/A 03/04/2018   Procedure: EXPLORATION OF LUMBAR DECOMPRESSION WOUND;  Surgeon: Billing Reyes, MD;  Location: [facility] OR;  Service: Orthopedics;  Laterality: N/A;   Patient Active Problem List   Diagnosis Date Noted   Pain in thumb joint with movement of right hand 08/12/2023   Localized primary osteoarthritis of carpometacarpal joint of right thumb 07/29/2023   Right foot drop 07/29/2023   Frequent falls 04/14/2023   Gastroesophageal reflux disease 04/14/2023   Right upper extremity numbness 09/29/2022   Stage 3a chronic kidney disease (HCC) 09/29/2022   Cervical spondylosis with radiculopathy 01/05/2022   S/P cervical spinal fusion 01/03/2022   Cervical disc disorder with radiculopathy of cervical region 11/27/2021   Uncontrolled pain 05/27/2021   Syncope 05/13/2021   Depression with anxiety 05/13/2021   Chronic diastolic CHF (congestive heart failure) (HCC) 05/13/2021   Chronic back pain 05/13/2021   Chest tightness 05/13/2021   Lower abdominal pain 05/13/2021   Syncope and collapse 01/01/2021   Abnormal echocardiogram 01/01/2021   BPPV (benign paroxysmal positional vertigo), left 12/26/2020   Odynophagia    Dysphagia 12/15/2020   Postural dizziness with presyncope 12/14/2020   Lumbar post-laminectomy syndrome    Chronic pain 09/28/2019   Lumbar spinal stenosis    Radicular pain    Gait disorder    Difficulty in urination 07/13/2018   Back pain 07/12/2018   Hyperlipidemia 04/16/2018   GAD (generalized anxiety disorder) 04/16/2018   AKI (acute kidney injury) (HCC)    Benign essential HTN    Major depression, recurrent (HCC)    Lumbar radiculopathy 03/09/2018   Paraparesis (HCC)  03/09/2018   Essential hypertension    Chronic nonintractable headache    Leukocytosis    Acute blood loss anemia    Postoperative pain    Generalized weakness    Spinal stenosis at L4-L5 level 03/04/2018   Spinal stenosis of lumbar region 12/28/2015   Chest pain 04/16/2015    ONSET DATE: Referral date 04-01-23  REFERRING DIAG: M54.16 (ICD-10-CM) - Lumbar radiculopathy  THERAPY DIAG:  Muscle weakness (generalized)  Other abnormalities of gait and mobility  Rationale for Evaluation and Treatment: Rehabilitation  SUBJECTIVE:  SUBJECTIVE STATEMENT:  Pt reports she was feeling good yesterday with no significant increase in back pain - was sitting on sofa without brace on - stood up and took a couple of steps without thinking and says her Rt leg gave way and she fell but caught herself on an object and did not get hurt.  Was able to break her fall and did not hit the floor.   Pt accompanied by: self  PERTINENT HISTORY: Frequent falls:  Spinal cord stimulator insertion on 09-28-19; stimulator removal on 05-27-21 with resultant LLE weakness;  decompression and laminectomy on 12/28/15 at L4-L5  with redo decompression surgery on May 9,2019, post-op paraparesis with surgery to remove small hematoma; 07/12/18 fall at work (involved rolling chair) with hospital admission, transfer to CIR with d/c home 08/03/18; HTN, Bil foot surgery; left ventricular hypertrophy, chronic pain syndrome, dyspnea, hypertrophic cardiomyopathy; cervical fusion March 2023;  had carpal tunnel surgery RUE in April 2024:  LVH  PAIN:  10-29-23  Are you having pain? Intensity: 5/10 in low back:   does not pay attention to the pain in my hand Location:  low back, Rt hand Description: soreness in low back;  radiates down Rt lateral leg to the  knee:  burning sensation in Rt hand Duration; varies depending on activity or movement being performed - worse with bending over or pulling leg up to chest Aggravating factors - certain movements; unable to turn or rotate Alleviating factor - heating pad helps some; pain medication  PRECAUTIONS: Fall  WEIGHT BEARING RESTRICTIONS: No  FALLS: Has patient fallen in last 6 months? Yes. Number of falls 3  LIVING ENVIRONMENT: Lives with: lives with their spouse Lives in: House/apartment Stairs: Yes: External: 5 steps; on right going up Has following equipment at home: Single point cane, Wheelchair (manual), and [place] crutch  PLOF: Independent with basic ADLs, Independent with household mobility with device, Independent with community mobility with device, and Independent with transfers  PATIENT GOALS: improve strength Rt leg  OBJECTIVE:   DIAGNOSTIC FINDINGS: IMPRESSION: 1. Interval anterior discectomy and fusion from C4 through C6 with improved patency of the spinal canal and only mild residual foraminal narrowing. 2. Stable mild spinal stenosis and mild to moderate left foraminal narrowing at C3-4. 3. Stable mild spinal stenosis and mild foraminal narrowing bilaterally at C6-7. Mild foraminal narrowing bilaterally at C7-T1. 4. No cord deformity or abnormal cord signal.  No acute findings. No recent MRI of lumbar region  LOWER EXTREMITY MMT:    MMT Right Eval Left Eval  Hip flexion 3+   Hip extension    Hip abduction    Hip adduction    Hip internal rotation    Hip external rotation    Knee flexion 3-   Knee extension 3-   Ankle dorsiflexion 2-   Ankle plantarflexion    Ankle inversion    Ankle eversion    (Blank rows = not tested)   Today's Treatment: 10-29-23  There were no vitals filed for this visit.    GAIT: Gait pattern: decreased step length- Right, decreased stance time- Right, decreased hip/knee flexion- Right, and decreased ankle dorsiflexion-  Right Distance walked: approx. 175'  Assistive device utilized: rollator  Level of assistance: SBA to CGA Comments: blue rocker AFO donned on RLE      05-07-23: Gait velocity: with [place] Walk on AFO - 118 secs with rollator = .42 ft/sec   With Toe off AFO 31.9 secs with rollator = 1.03 ft/sec  Gait velocity =  32.62 secs = 1.01 ft/sec with RW    THEREX:  10-27-23  Pelvic tilt 5 sec hold x 10 reps Glut isometrics  10 reps, 5 sec hold Rt Hip flexion in hooklying 10 reps - assisted Assisted RLE heel slides 5 reps - small ROM Bil. Hip abdct. In hooklying 10 reps Small range trunk rotation - 10 reps - small range to each side with approx. 4 sec hold  SciFit level 2.0 x 10 with bil. Ue's and LE's - pt reported no back pain with this exercise  GAIT: Gait pattern: decreased step length- Right, decreased stance time- Right, decreased hip/knee flexion- Right, and decreased ankle dorsiflexion- Right Distance walked: approx. 175' (1 1/2 laps around track) Assistive device utilized: rollator  Level of assistance: SBA to CGA Comments: blue rocker AFO donned on RLE   HEP - Medbridge  Access Code: [email or web address] URL: [email or web address] Date: 05/06/2023 Prepared by: Rock Kussmaul  Exercises - Beginner Bridge  - 1 x daily - 7 x weekly - 1 sets - 10 reps - 3 hold - Supine Heel Slide  - 1 x daily - 7 x weekly - 1 sets - 10 reps - Bent Knee Fallouts  - 1 x daily - 7 x weekly - 1 sets - 10 reps - Sidelying Hip Abduction  - 1 x daily - 7 x weekly - 1 sets - 10 reps - Seated Knee Flexion Extension AROM   - 1 x daily - 7 x weekly - 1 sets - 10 reps   PATIENT EDUCATION: Education details:  cont with HEP Person educated: Patient Education method: Explanation, demonstration, handouts  Education comprehension: verbalized understanding, demonstrated understanding  HOME EXERCISE PROGRAM: To be issued  GOALS: Goals reviewed with patient? Yes          NEW LONG TERM  GOALS:  Target date:  08-21-23    NEW TARGET DATE:  10-09-23     1.  Amb. 300' with RW with blue rocker AFO on RLE with SBA without requiring seated rest period.    Baseline;  180', 70' with rollator;  36' with rollator -- 09-01-23    Status:  Not met 10-27-23 due to back pain  2.  Improve TUG score from 22.25 secs to </= 15 secs with use of rollator.  REVISED:  score </= 19 secs  Baseline:  22.25 secs with rollator & blue rocker AFO on 06-25-23; 30.78 secs with RW  Status:  Not met 10-27-23   3.  Pt will report ability to negotiate steps in her garage at home modified independently for safely entering & exiting home.  Baseline; Goal met 09-01-23  Status:  MET  4.  Increase gait velocity to >/= 1.5 ft/sec with use of AFO & rollator for increased gait efficiency.  Baseline:  With Toe off AFO 31.9 secs with rollator = 1.03 ft/sec    Status:  Not met = 1.01 ft/sec with RW with blue rocker AFO on RLE  5.  Independent in updated HEP for RLE strengthening.  Baseline:  Goal status: Goal met 08-25-23   REVISED LTG's:  TARGET DATE 10-09-23  1.  Amb. 300' with RW with blue rocker AFO on RLE with SBA without requiring seated rest period.    Baseline;  180', 6' with rollator;  48' with rollator -- 09-01-23    Status:  Deferred due to severity of back pain - 10-05-23:  Not met 10-27-23 due to back pain  2.  Improve TUG score from 22.25 secs  to </= 15 secs with use of rollator.  REVISED:  score </= 19 secs  Baseline:  22.25 secs with rollator & blue rocker AFO on 06-25-23;  30.78 secs with RW  Status:  Not met 10-27-23  3.  Pt will report ability to negotiate steps in her garage at home modified independently for safely entering & exiting home.  Baseline;   Status:  Goal met 09-01-23  4.  Increase gait velocity to >/= 1.5 ft/sec with use of AFO & rollator for increased gait efficiency.  Baseline:  With Toe off AFO 31.9 secs with rollator = 1.03 ft/sec :  10-05-23 - 59.31 secs with use of rollator  = .55 ft/sec with use of rollator;  1.01 ft/sec with RW with blue rocker AFO on RLE    Status: Goal not met 10-27-23  5.  Independent in updated HEP for RLE strengthening.  Baseline:  Goal status: Goal met 08-25-23   NEW LTG's:  TARGET DATE 11-20-23  1.  Amb. 230' with RW with blue rocker AFO on RLE with SBA without requiring seated rest period.    Baseline;  180', 68' with rollator;  13' with rollator -- 09-01-23    Status:  Deferred due to severity of back pain - 10-05-23:  Not met 10-27-23 due to back pain - REVISED  2.  Improve TUG score from 30.78 secs to </= 26 secs with use of rollator.   Baseline:  22.25 secs with rollator & blue rocker AFO on 06-25-23;  30.78 secs with RW  Status:  Revised   3. Increase gait velocity to >/= 1.3 ft/sec with use of AFO & rollator for increased gait efficiency. Baseline:  With Toe off AFO 31.9 secs with rollator = 1.03 ft/sec :  10-05-23 - 59.31 secs with use of rollator = .55 ft/sec with use of rollator;  1.01 ft/sec with RW with blue rocker AFO on RLE   Status: REVISED  4.  Report decreased back pain to </= 4/10 for increased comfort with mobility and ADL's. Baseline: 5/10 on 10-27-23 Goal status: Goal met 08-25-23    ASSESSMENT:  CLINICAL IMPRESSION: PT session focused on gentle low back stretching and strengthening exercises and RLE strengthening.  Pt continues to need min assist with hip flexion in hooklying position to reduce back strain/pain.  Pt able to amb. 175' with rollator in today's session, prior to performing exercises.  Cont with POC.    OBJECTIVE IMPAIRMENTS: Abnormal gait, decreased balance, decreased coordination, decreased endurance, decreased strength, and impaired tone.   ACTIVITY LIMITATIONS: carrying, bending, squatting, stairs, transfers, and locomotion level  PARTICIPATION LIMITATIONS: cleaning, laundry, shopping, and community activity  PERSONAL FACTORS: Behavior pattern, Past/current experiences, and Time since  onset of injury/illness/exacerbation are also affecting patient's functional outcome.   REHAB POTENTIAL: Good  CLINICAL DECISION MAKING: Evolving/moderate complexity  EVALUATION COMPLEXITY: Moderate  PLAN:  PT FREQUENCY: 2x/week  PT DURATION: 4 weeks  PLANNED INTERVENTIONS: Therapeutic exercises, Therapeutic activity, Neuromuscular re-education, Balance training, Gait training, Patient/Family education, Self Care, Stair training, Orthotic/Fit training, DME instructions, and Aquatic Therapy  PLAN FOR NEXT SESSION:  continue RLE strengthening   [place]anna [place], PT 10/29/2023, 7:44 PM

## 2023-11-02 DIAGNOSIS — L304 Erythema intertrigo: Secondary | ICD-10-CM | POA: Diagnosis not present

## 2023-11-03 ENCOUNTER — Ambulatory Visit: Payer: PPO | Admitting: Physical Therapy

## 2023-11-03 ENCOUNTER — Encounter: Payer: Self-pay | Admitting: Physical Therapy

## 2023-11-03 ENCOUNTER — Other Ambulatory Visit: Payer: Self-pay

## 2023-11-03 ENCOUNTER — Other Ambulatory Visit (HOSPITAL_COMMUNITY): Payer: Self-pay

## 2023-11-03 DIAGNOSIS — M6281 Muscle weakness (generalized): Secondary | ICD-10-CM | POA: Diagnosis not present

## 2023-11-03 DIAGNOSIS — R2689 Other abnormalities of gait and mobility: Secondary | ICD-10-CM

## 2023-11-03 MED ORDER — FLUCONAZOLE 200 MG PO TABS
200.0000 mg | ORAL_TABLET | ORAL | 0 refills | Status: DC
  Filled 2023-11-03: qty 2, 14d supply, fill #0

## 2023-11-03 NOTE — Therapy (Signed)
 OUTPATIENT PHYSICAL THERAPY NEURO TREATMENT NOTE      Patient Name: [name] MRN: [id number] DOB:[birth year], 63 y.o., female Today's Date: 11/03/2023   PCP: Jordan, [name]., MD REFERRING PROVIDER: Babs Arthea DASEN, MD  END OF SESSION:  PT End of Session - 11/03/23 2008     Visit Number 35    Number of Visits 38    Date for PT Re-Evaluation 11/13/23    Authorization Type [employer]    Authorization Time Period 04-21-23 - 06-27-23;  06-25-23 - 08-27-23;  09-01-23 - 10-27-23    Progress Note Due on Visit 40    PT Start Time 0933    PT Stop Time 1016    PT Time Calculation (min) 43 min    Equipment Utilized During Treatment Gait belt    Activity Tolerance Patient limited by pain   limited by RLE weakness   Behavior During Therapy [facility] for tasks assessed/performed                                            Past Medical History:  Diagnosis Date   Anxiety    Aortic atherosclerosis (HCC) 04/16/2021   Atypical angina (HCC)    Back pain    related to spinal stenosis and disc problem, radiates down left buttocks to leg., weakness occ.   Chest pain    a. 03/2015 Cath: nl cors; b. 03/2021 Cor CTA: Ca2+ = 0. Nl Cors.   Chronic pain syndrome    Dyspnea    GERD (gastroesophageal reflux disease)    Grade I diastolic dysfunction    Headache    Hyperlipidemia    Hypertension    Lumbar post-laminectomy syndrome    LVH (left ventricular hypertrophy) 12/15/2020   a. 11/2020 Echo: EF 65-70%, no rwma, sev asymm LVH with IVSd 1.9 cm. No LVOT obs @ rest. Gr1 DD. Triv MR.   PONV (postoperative nausea and vomiting)    Pulmonary nodules    a. 03/2021 CT Chest: 3mm pulm nodules in bilat lower lobes. F/u 1 yr.   Right foot drop    Syncope    a. 11/2020 Zio: No significant arrhythmias.   Vaginal foreign object    Uses Femring    Past Surgical History:  Procedure Laterality Date   ABDOMINAL HYSTERECTOMY     ANTERIOR CERVICAL DECOMP/DISCECTOMY  FUSION N/A 01/03/2022   Procedure: Anterior Cervical Decompression Fusion - Cervical four-Cervical five - Cervical five-Cervical six;  Surgeon: Joshua Alm RAMAN, MD;  Location: [facility] OR;  Service: Neurosurgery;  Laterality: N/A;   BIOPSY  12/16/2020   Procedure: BIOPSY;  Surgeon: Aneita Gwendlyn DASEN, MD;  Location: [facility] ENDOSCOPY;  Service: Endoscopy;;   CARDIAC CATHETERIZATION N/A 04/18/2015   Procedure: Left Heart Cath and Coronary Angiography;  Surgeon: Rober Chroman, MD;  Location: [facility] INVASIVE CV LAB;  Service: Cardiovascular;  Laterality: N/A;   COLONOSCOPY     COLONOSCOPY W/ BIOPSIES AND POLYPECTOMY  2018   ESOPHAGOGASTRODUODENOSCOPY (EGD) WITH PROPOFOL  N/A 12/16/2020   Procedure: ESOPHAGOGASTRODUODENOSCOPY (EGD) WITH PROPOFOL ;  Surgeon: Aneita Gwendlyn DASEN, MD;  Location: [facility] ENDOSCOPY;  Service: Endoscopy;  Laterality: N/A;   FOOT SURGERY Bilateral    [facility] bunion,bone spur, tendon (1) -6'16, (1)-10'16   HEMATOMA EVACUATION N/A 01/05/2022   Procedure: Cervical Wound Exploration;  Surgeon: Gillie Duncans, MD;  Location: [facility] OR;  Service: Neurosurgery;  Laterality: N/A;  IR EPIDUROGRAPHY  07/21/2018   LUMBAR LAMINECTOMY/DECOMPRESSION MICRODISCECTOMY Bilateral 12/28/2015   Procedure: MICRO LUMBAR DECOMPRESSION L4 - L5 BILATERALLY;  Surgeon: Reyes Billing, MD;  Location: [facility] ORS;  Service: Orthopedics;  Laterality: Bilateral;   LUMBAR LAMINECTOMY/DECOMPRESSION MICRODISCECTOMY Bilateral 03/04/2018   Procedure: Revision of Microlumbar Decompression Bilateral Lumbar Four-Five;  Surgeon: Billing Reyes, MD;  Location: [facility] OR;  Service: Orthopedics;  Laterality: Bilateral;  90 mins   SAVORY DILATION N/A 12/16/2020   Procedure: SAVORY DILATION;  Surgeon: Aneita Gwendlyn DASEN, MD;  Location: [facility] ENDOSCOPY;  Service: Endoscopy;  Laterality: N/A;   SPINAL CORD STIMULATOR INSERTION N/A 09/28/2019   Procedure: THORACIC SPINAL CORD STIMULATOR INSERTION;  Surgeon: Burnetta Aures, MD;  Location: [facility] OR;  Service:  Orthopedics;  Laterality: N/A;  2.5 hrs   SPINAL CORD STIMULATOR REMOVAL N/A 05/27/2021   Procedure: LUMBAR SPINAL CORD STIMULATOR REMOVAL;  Surgeon: Bluford Standing, MD;  Location: [facility] ORS;  Service: Neurosurgery;  Laterality: N/A;   TUBAL LIGATION     WISDOM TOOTH EXTRACTION     WOUND EXPLORATION N/A 03/04/2018   Procedure: EXPLORATION OF LUMBAR DECOMPRESSION WOUND;  Surgeon: Billing Reyes, MD;  Location: [facility] OR;  Service: Orthopedics;  Laterality: N/A;   Patient Active Problem List   Diagnosis Date Noted   Pain in thumb joint with movement of right hand 08/12/2023   Localized primary osteoarthritis of carpometacarpal joint of right thumb 07/29/2023   Right foot drop 07/29/2023   Frequent falls 04/14/2023   Gastroesophageal reflux disease 04/14/2023   Right upper extremity numbness 09/29/2022   Stage 3a chronic kidney disease (HCC) 09/29/2022   Cervical spondylosis with radiculopathy 01/05/2022   S/P cervical spinal fusion 01/03/2022   Cervical disc disorder with radiculopathy of cervical region 11/27/2021   Uncontrolled pain 05/27/2021   Syncope 05/13/2021   Depression with anxiety 05/13/2021   Chronic diastolic CHF (congestive heart failure) (HCC) 05/13/2021   Chronic back pain 05/13/2021   Chest tightness 05/13/2021   Lower abdominal pain 05/13/2021   Syncope and collapse 01/01/2021   Abnormal echocardiogram 01/01/2021   BPPV (benign paroxysmal positional vertigo), left 12/26/2020   Odynophagia    Dysphagia 12/15/2020   Postural dizziness with presyncope 12/14/2020   Lumbar post-laminectomy syndrome    Chronic pain 09/28/2019   Lumbar spinal stenosis    Radicular pain    Gait disorder    Difficulty in urination 07/13/2018   Back pain 07/12/2018   Hyperlipidemia 04/16/2018   GAD (generalized anxiety disorder) 04/16/2018   AKI (acute kidney injury) (HCC)    Benign essential HTN    Major depression, recurrent (HCC)    Lumbar radiculopathy 03/09/2018   Paraparesis (HCC)  03/09/2018   Essential hypertension    Chronic nonintractable headache    Leukocytosis    Acute blood loss anemia    Postoperative pain    Generalized weakness    Spinal stenosis at L4-L5 level 03/04/2018   Spinal stenosis of lumbar region 12/28/2015   Chest pain 04/16/2015    ONSET DATE: Referral date 04-01-23  REFERRING DIAG: M54.16 (ICD-10-CM) - Lumbar radiculopathy  THERAPY DIAG:  Muscle weakness (generalized)  Other abnormalities of gait and mobility  Rationale for Evaluation and Treatment: Rehabilitation  SUBJECTIVE:  SUBJECTIVE STATEMENT:  Pt reports the cold weather is making her back pain worse.  Continues to use heating pad to help manage the pain  Pt accompanied by: self  PERTINENT HISTORY: Frequent falls:  Spinal cord stimulator insertion on 09-28-19; stimulator removal on 05-27-21 with resultant LLE weakness;  decompression and laminectomy on 12/28/15 at L4-L5  with redo decompression surgery on May 9,2019, post-op paraparesis with surgery to remove small hematoma; 07/12/18 fall at work (involved rolling chair) with hospital admission, transfer to CIR with d/c home 08/03/18; HTN, Bil foot surgery; left ventricular hypertrophy, chronic pain syndrome, dyspnea, hypertrophic cardiomyopathy; cervical fusion March 2023;  had carpal tunnel surgery RUE in April 2024:  LVH  PAIN:  11-03-23  Are you having pain? Intensity: 6-7/10 in low back:   does not pay attention to the pain in my hand Location:  low back, Rt hand Description: soreness in low back;  radiates down Rt lateral leg to the knee:  burning sensation in Rt hand Duration; varies depending on activity or movement being performed - worse with bending over or pulling leg up to chest Aggravating factors - certain movements; unable to turn or  rotate Alleviating factor - heating pad helps some; pain medication  PRECAUTIONS: Fall  WEIGHT BEARING RESTRICTIONS: No  FALLS: Has patient fallen in last 6 months? Yes. Number of falls 3  LIVING ENVIRONMENT: Lives with: lives with their spouse Lives in: House/apartment Stairs: Yes: External: 5 steps; on right going up Has following equipment at home: Single point cane, Wheelchair (manual), and [place]strand crutch  PLOF: Independent with basic ADLs, Independent with household mobility with device, Independent with community mobility with device, and Independent with transfers  PATIENT GOALS: improve strength Rt leg  OBJECTIVE:   DIAGNOSTIC FINDINGS: IMPRESSION: 1. Interval anterior discectomy and fusion from C4 through C6 with improved patency of the spinal canal and only mild residual foraminal narrowing. 2. Stable mild spinal stenosis and mild to moderate left foraminal narrowing at C3-4. 3. Stable mild spinal stenosis and mild foraminal narrowing bilaterally at C6-7. Mild foraminal narrowing bilaterally at C7-T1. 4. No cord deformity or abnormal cord signal.  No acute findings. No recent MRI of lumbar region  LOWER EXTREMITY MMT:    MMT Right Eval Left Eval  Hip flexion 3+   Hip extension    Hip abduction    Hip adduction    Hip internal rotation    Hip external rotation    Knee flexion 3-   Knee extension 3-   Ankle dorsiflexion 2-   Ankle plantarflexion    Ankle inversion    Ankle eversion    (Blank rows = not tested)   Today's Treatment: 11-03-23  There were no vitals filed for this visit.    GAIT: Gait pattern: decreased step length- Right, decreased stance time- Right, decreased hip/knee flexion- Right, and decreased ankle dorsiflexion- Right Distance walked: clinic distances Assistive device utilized: rollator  Level of assistance: SBA to CGA Comments: blue rocker AFO donned on RLE      05-07-23: Gait velocity: with [place] Walk on AFO - 118  secs with rollator = .42 ft/sec   With Toe off AFO 31.9 secs with rollator = 1.03 ft/sec  Gait velocity = 32.62 secs = 1.01 ft/sec with RW    THEREX:  11-03-23  Pelvic tilt 5 sec hold x 10 reps Glut isometrics  10 reps, 5 sec hold Rt Hip flexion in hooklying 10 reps - assisted Assisted RLE heel slides 5 reps -  small ROM Bil. Hip abdct. In hooklying 10 reps Small range trunk rotation - 10 reps - small range to each side with approx. 4 sec hold SAQ RLE 10 reps - no weight used LAQ RLE 10 reps - no weight used  SciFit level 2.0 x 10 with bil. Ue's and LE's - pt reported no back pain with this exercise  GAIT: Gait pattern: decreased step length- Right, decreased stance time- Right, decreased hip/knee flexion- Right, and decreased ankle dorsiflexion- Right Distance walked: approx. 175' (1 1/2 laps around track) Assistive device utilized: rollator  Level of assistance: SBA to CGA Comments: blue rocker AFO donned on RLE   HEP - Med[employer]  Access Code: [email or web address] URL: [email or web address] Date: 05/06/2023 Prepared by: Rock Kussmaul  Exercises - Beginner Bridge  - 1 x daily - 7 x weekly - 1 sets - 10 reps - 3 hold - Supine Heel Slide  - 1 x daily - 7 x weekly - 1 sets - 10 reps - Bent Knee Fallouts  - 1 x daily - 7 x weekly - 1 sets - 10 reps - Sidelying Hip Abduction  - 1 x daily - 7 x weekly - 1 sets - 10 reps - Seated Knee Flexion Extension AROM   - 1 x daily - 7 x weekly - 1 sets - 10 reps   PATIENT EDUCATION: Education details:  cont with HEP Person educated: Patient Education method: Explanation, demonstration, handouts  Education comprehension: verbalized understanding, demonstrated understanding  HOME EXERCISE PROGRAM: To be issued  GOALS: Goals reviewed with patient? Yes          NEW LONG TERM GOALS:  Target date:  08-21-23    NEW TARGET DATE:  10-09-23     1.  Amb. 300' with RW with blue rocker AFO on RLE with SBA without requiring seated  rest period.    Baseline;  180', 32' with rollator;  70' with rollator -- 09-01-23    Status:  Not met 10-27-23 due to back pain  2.  Improve TUG score from 22.25 secs to </= 15 secs with use of rollator.  REVISED:  score </= 19 secs  Baseline:  22.25 secs with rollator & blue rocker AFO on 06-25-23; 30.78 secs with RW  Status:  Not met 10-27-23   3.  Pt will report ability to negotiate steps in her garage at home modified independently for safely entering & exiting home.  Baseline; Goal met 09-01-23  Status:  MET  4.  Increase gait velocity to >/= 1.5 ft/sec with use of AFO & rollator for increased gait efficiency.  Baseline:  With Toe off AFO 31.9 secs with rollator = 1.03 ft/sec    Status:  Not met = 1.01 ft/sec with RW with blue rocker AFO on RLE  5.  Independent in updated HEP for RLE strengthening.  Baseline:  Goal status: Goal met 08-25-23   REVISED LTG's:  TARGET DATE 10-09-23  1.  Amb. 300' with RW with blue rocker AFO on RLE with SBA without requiring seated rest period.    Baseline;  180', 37' with rollator;  28' with rollator -- 09-01-23    Status:  Deferred due to severity of back pain - 10-05-23:  Not met 10-27-23 due to back pain  2.  Improve TUG score from 22.25 secs to </= 15 secs with use of rollator.  REVISED:  score </= 19 secs  Baseline:  22.25 secs with rollator & blue rocker AFO on 06-25-23;  30.78  secs with RW  Status:  Not met 10-27-23  3.  Pt will report ability to negotiate steps in her garage at home modified independently for safely entering & exiting home.  Baseline;   Status:  Goal met 09-01-23  4.  Increase gait velocity to >/= 1.5 ft/sec with use of AFO & rollator for increased gait efficiency.  Baseline:  With Toe off AFO 31.9 secs with rollator = 1.03 ft/sec :  10-05-23 - 59.31 secs with use of rollator = .55 ft/sec with use of rollator;  1.01 ft/sec with RW with blue rocker AFO on RLE    Status: Goal not met 10-27-23  5.  Independent in updated HEP  for RLE strengthening.  Baseline:  Goal status: Goal met 08-25-23   NEW LTG's:  TARGET DATE 11-20-23  1.  Amb. 230' with RW with blue rocker AFO on RLE with SBA without requiring seated rest period.    Baseline;  180', 72' with rollator;  78' with rollator -- 09-01-23    Status:  Deferred due to severity of back pain - 10-05-23:  Not met 10-27-23 due to back pain - REVISED  2.  Improve TUG score from 30.78 secs to </= 26 secs with use of rollator.   Baseline:  22.25 secs with rollator & blue rocker AFO on 06-25-23;  30.78 secs with RW  Status:  Revised   3. Increase gait velocity to >/= 1.3 ft/sec with use of AFO & rollator for increased gait efficiency. Baseline:  With Toe off AFO 31.9 secs with rollator = 1.03 ft/sec :  10-05-23 - 59.31 secs with use of rollator = .55 ft/sec with use of rollator;  1.01 ft/sec with RW with blue rocker AFO on RLE   Status: REVISED  4.  Report decreased back pain to </= 4/10 for increased comfort with mobility and ADL's. Baseline: 5/10 on 10-27-23 Goal status: Goal met 08-25-23    ASSESSMENT:  CLINICAL IMPRESSION: PT session focused on gentle low back stretching and strengthening exercises and RLE strengthening.  Pt able to tolerate and perform small range Rt hip flexion in hooklying position with min assist needed to flex hip to reduce strain on low back and also small range Lt trunk rotation for Rt lateral trunk stretching.  Cont with POC.    OBJECTIVE IMPAIRMENTS: Abnormal gait, decreased balance, decreased coordination, decreased endurance, decreased strength, and impaired tone.   ACTIVITY LIMITATIONS: carrying, bending, squatting, stairs, transfers, and locomotion level  PARTICIPATION LIMITATIONS: cleaning, laundry, shopping, and community activity  PERSONAL FACTORS: Behavior pattern, Past/current experiences, and Time since onset of injury/illness/exacerbation are also affecting patient's functional outcome.   REHAB POTENTIAL: Good  CLINICAL  DECISION MAKING: Evolving/moderate complexity  EVALUATION COMPLEXITY: Moderate  PLAN:  PT FREQUENCY: 2x/week  PT DURATION: 4 weeks  PLANNED INTERVENTIONS: Therapeutic exercises, Therapeutic activity, Neuromuscular re-education, Balance training, Gait training, Patient/Family education, Self Care, Stair training, Orthotic/Fit training, DME instructions, and Aquatic Therapy  PLAN FOR NEXT SESSION:  results of MD appt on 11-05-23?  Continue low back stretching   Luciano Cinquemani, [place], PT 11/03/2023, 8:10 PM

## 2023-11-05 ENCOUNTER — Ambulatory Visit: Payer: Self-pay | Admitting: Physical Therapy

## 2023-11-05 DIAGNOSIS — M4316 Spondylolisthesis, lumbar region: Secondary | ICD-10-CM | POA: Diagnosis not present

## 2023-11-09 ENCOUNTER — Telehealth: Payer: Self-pay | Admitting: Internal Medicine

## 2023-11-09 ENCOUNTER — Ambulatory Visit: Payer: PPO | Admitting: Physical Therapy

## 2023-11-09 VITALS — BP 174/95

## 2023-11-09 DIAGNOSIS — M6281 Muscle weakness (generalized): Secondary | ICD-10-CM | POA: Diagnosis not present

## 2023-11-09 DIAGNOSIS — R2689 Other abnormalities of gait and mobility: Secondary | ICD-10-CM

## 2023-11-09 NOTE — Telephone Encounter (Signed)
 Pt c/o BP issue: STAT if pt c/o blurred vision, one-sided weakness or slurred speech  1. What are your last 5 BP readings?  1/13: 174/95  2. Are you having any other symptoms (ex. Dizziness, headache, blurred vision, passed out)?  SOB the past few nights   3. What is your BP issue?   BP has been elevated.

## 2023-11-09 NOTE — Telephone Encounter (Signed)
 Called and spoke with patient. She states her blood pressures have started to be elevated. Today at PT appointment noted to be 174/95. Patient has had increased back pain and is nervouse as she has been informed that she urgently needs back surgery within the next couple of weeks. S/P fall at home. Her PCP informed her today that she will need pre operative clearance from her cardiologist. Appointment made with MARLA Mose NP for Thursday at 2:45 pm on the 16th.

## 2023-11-09 NOTE — Telephone Encounter (Signed)
 Patient returned RN's call regarding BP issue.  See previous note.

## 2023-11-09 NOTE — Telephone Encounter (Signed)
 Called and left VMM for call back to [facility] at [phone].

## 2023-11-10 ENCOUNTER — Encounter: Payer: Self-pay | Admitting: Physical Therapy

## 2023-11-10 ENCOUNTER — Other Ambulatory Visit (HOSPITAL_COMMUNITY): Payer: Self-pay

## 2023-11-10 ENCOUNTER — Other Ambulatory Visit: Payer: Self-pay | Admitting: Family Medicine

## 2023-11-10 DIAGNOSIS — E785 Hyperlipidemia, unspecified: Secondary | ICD-10-CM

## 2023-11-10 MED ORDER — ATORVASTATIN CALCIUM 20 MG PO TABS
20.0000 mg | ORAL_TABLET | Freq: Every day | ORAL | 2 refills | Status: DC
  Filled 2023-11-10: qty 90, 90d supply, fill #0
  Filled 2024-02-15: qty 90, 90d supply, fill #1
  Filled 2024-05-23: qty 90, 90d supply, fill #2

## 2023-11-10 NOTE — Therapy (Signed)
 OUTPATIENT PHYSICAL THERAPY NEURO TREATMENT NOTE      Patient Name: [name] MRN: [id number] DOB:Aug 27, 1961, 63 y.o., female Today's Date: 11/10/2023   PCP: Jordan, [name]., MD REFERRING PROVIDER: Babs Arthea DASEN, MD  END OF SESSION:  PT End of Session - 11/10/23 1155     Visit Number 36    Number of Visits 38    Date for PT Re-Evaluation 11/13/23    Authorization Type [employer]    Authorization Time Period 04-21-23 - 06-27-23;  06-25-23 - 08-27-23;  09-01-23 - 10-27-23    Progress Note Due on Visit 40    PT Start Time 0935    PT Stop Time 1015    PT Time Calculation (min) 40 min    Activity Tolerance Patient limited by pain   limited by RLE weakness   Behavior During Therapy [facility] for tasks assessed/performed                                            Past Medical History:  Diagnosis Date   Anxiety    Aortic atherosclerosis (HCC) 04/16/2021   Atypical angina (HCC)    Back pain    related to spinal stenosis and disc problem, radiates down left buttocks to leg., weakness occ.   Chest pain    a. 03/2015 Cath: nl cors; b. 03/2021 Cor CTA: Ca2+ = 0. Nl Cors.   Chronic pain syndrome    Dyspnea    GERD (gastroesophageal reflux disease)    Grade I diastolic dysfunction    Headache    Hyperlipidemia    Hypertension    Lumbar post-laminectomy syndrome    LVH (left ventricular hypertrophy) 12/15/2020   a. 11/2020 Echo: EF 65-70%, no rwma, sev asymm LVH with IVSd 1.9 cm. No LVOT obs @ rest. Gr1 DD. Triv MR.   PONV (postoperative nausea and vomiting)    Pulmonary nodules    a. 03/2021 CT Chest: 3mm pulm nodules in bilat lower lobes. F/u 1 yr.   Right foot drop    Syncope    a. 11/2020 Zio: No significant arrhythmias.   Vaginal foreign object    Uses Femring    Past Surgical History:  Procedure Laterality Date   ABDOMINAL HYSTERECTOMY     ANTERIOR CERVICAL DECOMP/DISCECTOMY FUSION N/A 01/03/2022   Procedure: Anterior  Cervical Decompression Fusion - Cervical four-Cervical five - Cervical five-Cervical six;  Surgeon: Joshua Alm RAMAN, MD;  Location: [facility] OR;  Service: Neurosurgery;  Laterality: N/A;   BIOPSY  12/16/2020   Procedure: BIOPSY;  Surgeon: Aneita Gwendlyn DASEN, MD;  Location: [facility] ENDOSCOPY;  Service: Endoscopy;;   CARDIAC CATHETERIZATION N/A 04/18/2015   Procedure: Left Heart Cath and Coronary Angiography;  Surgeon: Rober Chroman, MD;  Location: [facility] INVASIVE CV LAB;  Service: Cardiovascular;  Laterality: N/A;   COLONOSCOPY     COLONOSCOPY W/ BIOPSIES AND POLYPECTOMY  2018   ESOPHAGOGASTRODUODENOSCOPY (EGD) WITH PROPOFOL  N/A 12/16/2020   Procedure: ESOPHAGOGASTRODUODENOSCOPY (EGD) WITH PROPOFOL ;  Surgeon: Aneita Gwendlyn DASEN, MD;  Location: [facility] ENDOSCOPY;  Service: Endoscopy;  Laterality: N/A;   FOOT SURGERY Bilateral    [facility] bunion,bone spur, tendon (1) -6'16, (1)-10'16   HEMATOMA EVACUATION N/A 01/05/2022   Procedure: Cervical Wound Exploration;  Surgeon: Gillie Duncans, MD;  Location: [facility] OR;  Service: Neurosurgery;  Laterality: N/A;   IR EPIDUROGRAPHY  07/21/2018   LUMBAR LAMINECTOMY/DECOMPRESSION MICRODISCECTOMY  Bilateral 12/28/2015   Procedure: MICRO LUMBAR DECOMPRESSION L4 - L5 BILATERALLY;  Surgeon: Reyes Billing, MD;  Location: [facility] ORS;  Service: Orthopedics;  Laterality: Bilateral;   LUMBAR LAMINECTOMY/DECOMPRESSION MICRODISCECTOMY Bilateral 03/04/2018   Procedure: Revision of Microlumbar Decompression Bilateral Lumbar Four-Five;  Surgeon: Billing Reyes, MD;  Location: [facility] OR;  Service: Orthopedics;  Laterality: Bilateral;  90 mins   SAVORY DILATION N/A 12/16/2020   Procedure: SAVORY DILATION;  Surgeon: Aneita Gwendlyn DASEN, MD;  Location: [facility] ENDOSCOPY;  Service: Endoscopy;  Laterality: N/A;   SPINAL CORD STIMULATOR INSERTION N/A 09/28/2019   Procedure: THORACIC SPINAL CORD STIMULATOR INSERTION;  Surgeon: Burnetta Aures, MD;  Location: [facility] OR;  Service: Orthopedics;  Laterality: N/A;  2.5 hrs    SPINAL CORD STIMULATOR REMOVAL N/A 05/27/2021   Procedure: LUMBAR SPINAL CORD STIMULATOR REMOVAL;  Surgeon: Bluford Standing, MD;  Location: [facility] ORS;  Service: Neurosurgery;  Laterality: N/A;   TUBAL LIGATION     WISDOM TOOTH EXTRACTION     WOUND EXPLORATION N/A 03/04/2018   Procedure: EXPLORATION OF LUMBAR DECOMPRESSION WOUND;  Surgeon: Billing Reyes, MD;  Location: [facility] OR;  Service: Orthopedics;  Laterality: N/A;   Patient Active Problem List   Diagnosis Date Noted   Pain in thumb joint with movement of right hand 08/12/2023   Localized primary osteoarthritis of carpometacarpal joint of right thumb 07/29/2023   Right foot drop 07/29/2023   Frequent falls 04/14/2023   Gastroesophageal reflux disease 04/14/2023   Right upper extremity numbness 09/29/2022   Stage 3a chronic kidney disease (HCC) 09/29/2022   Cervical spondylosis with radiculopathy 01/05/2022   S/P cervical spinal fusion 01/03/2022   Cervical disc disorder with radiculopathy of cervical region 11/27/2021   Uncontrolled pain 05/27/2021   Syncope 05/13/2021   Depression with anxiety 05/13/2021   Chronic diastolic CHF (congestive heart failure) (HCC) 05/13/2021   Chronic back pain 05/13/2021   Chest tightness 05/13/2021   Lower abdominal pain 05/13/2021   Syncope and collapse 01/01/2021   Abnormal echocardiogram 01/01/2021   BPPV (benign paroxysmal positional vertigo), left 12/26/2020   Odynophagia    Dysphagia 12/15/2020   Postural dizziness with presyncope 12/14/2020   Lumbar post-laminectomy syndrome    Chronic pain 09/28/2019   Lumbar spinal stenosis    Radicular pain    Gait disorder    Difficulty in urination 07/13/2018   Back pain 07/12/2018   Hyperlipidemia 04/16/2018   GAD (generalized anxiety disorder) 04/16/2018   AKI (acute kidney injury) (HCC)    Benign essential HTN    Major depression, recurrent (HCC)    Lumbar radiculopathy 03/09/2018   Paraparesis (HCC) 03/09/2018   Essential hypertension     Chronic nonintractable headache    Leukocytosis    Acute blood loss anemia    Postoperative pain    Generalized weakness    Spinal stenosis at L4-L5 level 03/04/2018   Spinal stenosis of lumbar region 12/28/2015   Chest pain 04/16/2015    ONSET DATE: Referral date 04-01-23  REFERRING DIAG: M54.16 (ICD-10-CM) - Lumbar radiculopathy  THERAPY DIAG:  Muscle weakness (generalized)  Other abnormalities of gait and mobility  Rationale for Evaluation and Treatment: Rehabilitation  SUBJECTIVE:  SUBJECTIVE STATEMENT:  Pt reports she is having significant back pain today - says she took a Tramadol  yesterday but it didn't help much.  Says she can't wait to have the surgery so that she can get some relief  Pt accompanied by: self  PERTINENT HISTORY: Frequent falls:  Spinal cord stimulator insertion on 09-28-19; stimulator removal on 05-27-21 with resultant LLE weakness;  decompression and laminectomy on 12/28/15 at L4-L5  with redo decompression surgery on May 9,2019, post-op paraparesis with surgery to remove small hematoma; 07/12/18 fall at work (involved rolling chair) with hospital admission, transfer to CIR with d/c home 08/03/18; HTN, Bil foot surgery; left ventricular hypertrophy, chronic pain syndrome, dyspnea, hypertrophic cardiomyopathy; cervical fusion March 2023;  had carpal tunnel surgery RUE in April 2024:  LVH  PAIN:  11-09-23  Are you having pain? Intensity: 6-7/10 in low back:   does not pay attention to the pain in my hand Location:  low back, Rt hand Description: soreness in low back;  radiates down Rt lateral leg to the knee:  burning sensation in Rt hand Duration; varies depending on activity or movement being performed - worse with bending over or pulling leg up to chest Aggravating factors -  certain movements; unable to turn or rotate Alleviating factor - heating pad helps some; pain medication  PRECAUTIONS: Fall  WEIGHT BEARING RESTRICTIONS: No  FALLS: Has patient fallen in last 6 months? Yes. Number of falls 3  LIVING ENVIRONMENT: Lives with: lives with their spouse Lives in: House/apartment Stairs: Yes: External: 5 steps; on right going up Has following equipment at home: Single point cane, Wheelchair (manual), and [place]strand crutch  PLOF: Independent with basic ADLs, Independent with household mobility with device, Independent with community mobility with device, and Independent with transfers  PATIENT GOALS: improve strength Rt leg  OBJECTIVE:   DIAGNOSTIC FINDINGS: IMPRESSION: 1. Interval anterior discectomy and fusion from C4 through C6 with improved patency of the spinal canal and only mild residual foraminal narrowing. 2. Stable mild spinal stenosis and mild to moderate left foraminal narrowing at C3-4. 3. Stable mild spinal stenosis and mild foraminal narrowing bilaterally at C6-7. Mild foraminal narrowing bilaterally at C7-T1. 4. No cord deformity or abnormal cord signal.  No acute findings. No recent MRI of lumbar region  LOWER EXTREMITY MMT:    MMT Right Eval Left Eval  Hip flexion 3+   Hip extension    Hip abduction    Hip adduction    Hip internal rotation    Hip external rotation    Knee flexion 3-   Knee extension 3-   Ankle dorsiflexion 2-   Ankle plantarflexion    Ankle inversion    Ankle eversion    (Blank rows = not tested)   Today's Treatment: 11-09-23  Vitals:   11/09/23 0944  BP: (!) 174/95      GAIT: Gait pattern: decreased step length- Right, decreased stance time- Right, decreased hip/knee flexion- Right, and decreased ankle dorsiflexion- Right Distance walked: clinic distances Assistive device utilized: rollator  Level of assistance: SBA to CGA Comments: blue rocker AFO donned on RLE      05-07-23: Gait  velocity: with [place] Walk on AFO - 118 secs with rollator = .42 ft/sec   With Toe off AFO 31.9 secs with rollator = 1.03 ft/sec  Gait velocity = 32.62 secs = 1.01 ft/sec with RW    THEREX:  11-09-23  Pelvic tilt 5 sec hold x 10 reps  Pt had increased back pain  at start of session so requested to lie down for few minutes to allow pain to subside; pt then requested to perform SciFit only; amb. With rollator from mat to SciFit approx. 15' with SBA - slowly due to increased back pain  SciFit level 2.0 x 10 with bil. Ue's and LE's - pt reported no increased back pain during this exercise  GAIT: Gait pattern: decreased step length- Right, decreased stance time- Right, decreased hip/knee flexion- Right, and decreased ankle dorsiflexion- Right Distance walked: clinic distances Assistive device utilized: rollator  Level of assistance: SBA to CGA Comments: blue rocker AFO donned on RLE   HEP - Medbridge  Access Code: [email or web address] URL: [email or web address] Date: 05/06/2023 Prepared by: Rock Kussmaul  Exercises - Beginner Bridge  - 1 x daily - 7 x weekly - 1 sets - 10 reps - 3 hold - Supine Heel Slide  - 1 x daily - 7 x weekly - 1 sets - 10 reps - Bent Knee Fallouts  - 1 x daily - 7 x weekly - 1 sets - 10 reps - Sidelying Hip Abduction  - 1 x daily - 7 x weekly - 1 sets - 10 reps - Seated Knee Flexion Extension AROM   - 1 x daily - 7 x weekly - 1 sets - 10 reps   PATIENT EDUCATION: Education details:  cont with HEP Person educated: Patient Education method: Explanation, demonstration, handouts  Education comprehension: verbalized understanding, demonstrated understanding  HOME EXERCISE PROGRAM: To be issued  GOALS: Goals reviewed with patient? Yes          NEW LONG TERM GOALS:  Target date:  08-21-23    NEW TARGET DATE:  10-09-23     1.  Amb. 300' with RW with blue rocker AFO on RLE with SBA without requiring seated rest period.    Baseline;  180', 40'  with rollator;  98' with rollator -- 09-01-23    Status:  Not met 10-27-23 due to back pain  2.  Improve TUG score from 22.25 secs to </= 15 secs with use of rollator.  REVISED:  score </= 19 secs  Baseline:  22.25 secs with rollator & blue rocker AFO on 06-25-23; 30.78 secs with RW  Status:  Not met 10-27-23   3.  Pt will report ability to negotiate steps in her garage at home modified independently for safely entering & exiting home.  Baseline; Goal met 09-01-23  Status:  MET  4.  Increase gait velocity to >/= 1.5 ft/sec with use of AFO & rollator for increased gait efficiency.  Baseline:  With Toe off AFO 31.9 secs with rollator = 1.03 ft/sec    Status:  Not met = 1.01 ft/sec with RW with blue rocker AFO on RLE  5.  Independent in updated HEP for RLE strengthening.  Baseline:  Goal status: Goal met 08-25-23   REVISED LTG's:  TARGET DATE 10-09-23  1.  Amb. 300' with RW with blue rocker AFO on RLE with SBA without requiring seated rest period.    Baseline;  180', 35' with rollator;  32' with rollator -- 09-01-23    Status:  Deferred due to severity of back pain - 10-05-23:  Not met 10-27-23 due to back pain  2.  Improve TUG score from 22.25 secs to </= 15 secs with use of rollator.  REVISED:  score </= 19 secs  Baseline:  22.25 secs with rollator & blue rocker AFO on 06-25-23;  30.78 secs with RW  Status:  Not met 10-27-23  3.  Pt will report ability to negotiate steps in her garage at home modified independently for safely entering & exiting home.  Baseline;   Status:  Goal met 09-01-23  4.  Increase gait velocity to >/= 1.5 ft/sec with use of AFO & rollator for increased gait efficiency.  Baseline:  With Toe off AFO 31.9 secs with rollator = 1.03 ft/sec :  10-05-23 - 59.31 secs with use of rollator = .55 ft/sec with use of rollator;  1.01 ft/sec with RW with blue rocker AFO on RLE    Status: Goal not met 10-27-23  5.  Independent in updated HEP for RLE strengthening.  Baseline:   Goal status: Goal met 08-25-23   NEW LTG's:  TARGET DATE 11-20-23  1.  Amb. 230' with RW with blue rocker AFO on RLE with SBA without requiring seated rest period.    Baseline;  180', 84' with rollator;  8' with rollator -- 09-01-23    Status:  Deferred due to severity of back pain - 10-05-23:  Not met 10-27-23 due to back pain - REVISED  2.  Improve TUG score from 30.78 secs to </= 26 secs with use of rollator.   Baseline:  22.25 secs with rollator & blue rocker AFO on 06-25-23;  30.78 secs with RW  Status:  Revised   3. Increase gait velocity to >/= 1.3 ft/sec with use of AFO & rollator for increased gait efficiency. Baseline:  With Toe off AFO 31.9 secs with rollator = 1.03 ft/sec :  10-05-23 - 59.31 secs with use of rollator = .55 ft/sec with use of rollator;  1.01 ft/sec with RW with blue rocker AFO on RLE   Status: REVISED  4.  Report decreased back pain to </= 4/10 for increased comfort with mobility and ADL's. Baseline: 5/10 on 10-27-23 Goal status: Goal met 08-25-23    ASSESSMENT:  CLINICAL IMPRESSION: Pt reported significant increased back pain at start of today's session and during session.  Pt unable to tolerate back exercises due to increased pain - requested to perform SciFit exercise only.  Plan D/C next session as pt is planning on having back surgery in near future (within next 2 weeks).  Cont with POC.    OBJECTIVE IMPAIRMENTS: Abnormal gait, decreased balance, decreased coordination, decreased endurance, decreased strength, and impaired tone.   ACTIVITY LIMITATIONS: carrying, bending, squatting, stairs, transfers, and locomotion level  PARTICIPATION LIMITATIONS: cleaning, laundry, shopping, and community activity  PERSONAL FACTORS: Behavior pattern, Past/current experiences, and Time since onset of injury/illness/exacerbation are also affecting patient's functional outcome.   REHAB POTENTIAL: Good  CLINICAL DECISION MAKING: Evolving/moderate  complexity  EVALUATION COMPLEXITY: Moderate  PLAN:  PT FREQUENCY: 2x/week  PT DURATION: 4 weeks  PLANNED INTERVENTIONS: Therapeutic exercises, Therapeutic activity, Neuromuscular re-education, Balance training, Gait training, Patient/Family education, Self Care, Stair training, Orthotic/Fit training, DME instructions, and Aquatic Therapy  PLAN FOR NEXT SESSION:  Continue low back stretching   Shantel Helwig, [place], PT 11/10/2023, 11:58 AM

## 2023-11-11 NOTE — Progress Notes (Signed)
Cardiology Office Note    Date:  11/12/2023  ID:  [name], [name] August 11, 1961, MRN [id number] PCP:  [place], [name], MD  Cardiologist:  Chrystie Nose, MD  Electrophysiologist:  None   Chief Complaint: Hypertension   History of Present Illness: .    [name] is a 63 y.o. female with visit-pertinent history of hypertension, hyperlipidemia, carotid stenosis, lumbar back pain, anxiety.  In 2016 she had a cardiac catheterization with no coronary disease.  03/2021 showed a cardiac CT with coronary calcium score of 0.  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  She was seen in clinic on 05/09/2022 for elevated blood pressure and chest pain in setting of losing her sister unexpectedly due to a stroke.  Her telmisartan was increased.  Chest pain was felt to be atypical for angina thought to be related to grief and stress.  She was provided hydralazine 25 mg as needed for systolic blood pressure greater than 180.  At follow-up on 06/03/2022 she had no real improvements in blood pressure and her telmisartan was stopped to allow for renin and aldosterone level to be checked.  She was started on chlorthalidone 25 mg daily.  Coreg 50 mg twice daily continued.  Due to reports of lightheadedness, falls and questionable syncope ZIO, carotid duplex and echo were ordered.  Catecholamines, metanephrines unremarkable.  Aldosterone level was normal but renin aldosterone level was elevated.  On 06/16/2022 she presented for carotid duplex and became unresponsive with seizure-like activity.  She was transported to the ED but left AMA prior to workup.  She had follow-up with her PCP and was referred to nephrology.  Subsequent sodium loading  with normal 24-hour urine sodium.  Monitor in 05/2022 indicated predominantly sinus rhythm, first-degree AV block, 1 run of SVT 4 beats max rate of 122 bpm.  She had isolated nonconducted beat noticed.  Echo 07/03/2022 indicated normal LVEF, moderate LVH, no significant valve abnormalities.  Carotid duplex on 07/03/2022 indicated left ICA 1 to 39% stenosis.  She was again seen in office on 08/02/2022 and her amlodipine was reduced to 5 mg daily due to lower extremity edema. Hydralazine 25 mg twice daily was added for blood pressure control.   At visit with Dr. Rennis Golden on 12/23/22 her blood pressure was 150/78, on repeat was 140/84. Spironolactone was increased from 25 to 50 mg daily.   She was seen in clinic on 03/17/23.  She reported that she had had a fall the month prior, she had fallen backwards down steps as she had missed a step and was not using an assistive device nor wearing her leg brace.  Her hydralazine was increased to 50 mg twice daily.  She was last seen in clinic on 06/23/2023 by Dr. Rennis Golden, was noted that her blood pressure was better controlled.  She had underwent repeat carotid Dopplers that day that showed no significant change.  Today she presents regarding elevations in blood pressure.  She reports that she has been having severe back pain in recent weeks causing her blood pressure to increase.  This morning at PT was 160/94, she reports similar readings in the past week and a half.  She denies chest pain, shortness of breath, headaches, dizziness, presyncope or syncope.  She does report an intermittent  left lower extremity pain that typically occurs when she is laying down at night although she did note that it recently occurred while she was working with physical therapy.  Described as a more severe cramping pain, she is unsure if this is potentially related to her back pain.  She denies any wounds or discoloration in the leg or foot.  Patient reports that she is planning to undergo back  surgery in the next few weeks.  Labwork independently reviewed: 10/14/2023: Sodium 139, potassium 4.0, creatinine 1.19, AST 14, ALT 20  ROS: .   Today she denies chest pain, shortness of breath, lower extremity edema, fatigue, palpitations, melena, hematuria, hemoptysis, diaphoresis, weakness, presyncope, syncope, orthopnea, and PND.  All other systems are reviewed and otherwise negative.  Studies Reviewed: [name]    EKG:  EKG is ordered today, personally reviewed, demonstrating normal sinus rhythm at 83 bpm, no significant change from prior.  CV Studies:  Cardiac Studies & Procedures      ECHOCARDIOGRAM  ECHOCARDIOGRAM COMPLETE 07/03/2022  Narrative ECHOCARDIOGRAM REPORT    Patient Name:   [name] Date of Exam: 07/03/2022 Medical Rec #:  [id number]                Height:       65.0 in Accession #:    [id number]               Weight:       168.0 lb Date of Birth:  18-Feb-1961               BSA:          1.837 m Patient Age:    60 years                 BP:           131/81 mmHg Patient Gender: F                        HR:           72 bpm. Exam Location:  Outpatient  Procedure: 2D Echo, 3D Echo, Color Doppler, Cardiac Doppler and Strain Analysis  Indications:    Essential hypertension  History:        Patient has prior history of Echocardiogram examinations, most recent 01/29/2021. Signs/Symptoms:Chest Pain and Syncope; Risk Factors:Dyslipidemia, Hypertension and Non-Smoker.  Sonographer:    Jeryl Columbia RDCS Referring Phys: [id number] CAITLIN S WALKER  IMPRESSIONS   1. Left ventricular ejection fraction, by estimation, is 60 to 65%. The left ventricle has normal function. The left ventricle has no regional wall motion abnormalities. There is moderate asymmetric left ventricular hypertrophy of the basal-septal segment. Left ventricular diastolic parameters were normal. The average left ventricular global longitudinal strain is -20.2 %. 2. Right ventricular  systolic function is normal. The right ventricular size is normal. There is normal pulmonary artery systolic pressure. The estimated right ventricular systolic pressure is 26.6 mmHg. 3. The mitral valve is normal in structure. Trivial mitral valve regurgitation. 4. The aortic valve was not well visualized. Aortic valve regurgitation is not visualized. No aortic stenosis is present. 5. The inferior vena cava is normal in size with greater than 50% respiratory variability, suggesting right atrial pressure of 3 mmHg.  FINDINGS Left Ventricle: Left ventricular ejection fraction, by estimation, is 60 to 65%. The left ventricle has normal function. The left ventricle has no regional wall motion abnormalities. The average left ventricular global longitudinal strain is -  20.2 %. The left ventricular internal cavity size was normal in size. There is moderate asymmetric left ventricular hypertrophy of the basal-septal segment. Left ventricular diastolic parameters were normal.  Right Ventricle: The right ventricular size is normal. No increase in right ventricular wall thickness. Right ventricular systolic function is normal. There is normal pulmonary artery systolic pressure. The tricuspid regurgitant velocity is 2.43 m/s, and with an assumed right atrial pressure of 3 mmHg, the estimated right ventricular systolic pressure is 26.6 mmHg.  Left Atrium: Left atrial size was normal in size.  Right Atrium: Right atrial size was normal in size.  Pericardium: There is no evidence of pericardial effusion.  Mitral Valve: The mitral valve is normal in structure. Trivial mitral valve regurgitation.  Tricuspid Valve: The tricuspid valve is normal in structure. Tricuspid valve regurgitation is mild.  Aortic Valve: The aortic valve was not well visualized. Aortic valve regurgitation is not visualized. No aortic stenosis is present.  Pulmonic Valve: The pulmonic valve was not well visualized. Pulmonic valve  regurgitation is not visualized.  Aorta: The aortic root and ascending aorta are structurally normal, with no evidence of dilitation.  Venous: The inferior vena cava is normal in size with greater than 50% respiratory variability, suggesting right atrial pressure of 3 mmHg.  IAS/Shunts: The interatrial septum was not well visualized.   LEFT VENTRICLE PLAX 2D LVIDd:         4.16 cm   Diastology LVIDs:         1.82 cm   LV e' medial:    7.40 cm/s LV PW:         1.35 cm   LV E/e' medial:  9.8 LV IVS:        0.90 cm   LV e' lateral:   10.80 cm/s LVOT diam:     2.00 cm   LV E/e' lateral: 6.7 LV SV:         67 LV SV Index:   37        2D Longitudinal Strain LVOT Area:     3.14 cm  2D Strain GLS Avg:     -20.2 %  3D Volume EF: 3D EF:        61 % LV EDV:       106 ml LV ESV:       41 ml LV SV:        64 ml  RIGHT VENTRICLE RV Basal diam:  2.64 cm RV Mid diam:    2.46 cm RV S prime:     15.10 cm/s TAPSE (M-mode): 2.4 cm  LEFT ATRIUM             Index        RIGHT ATRIUM          Index LA diam:        2.90 cm 1.58 cm/m   RA Area:     9.59 cm LA Vol (A2C):   46.0 ml 25.04 ml/m  RA Volume:   19.40 ml 10.56 ml/m LA Vol (A4C):   44.0 ml 23.95 ml/m LA Biplane Vol: 49.0 ml 26.68 ml/m AORTIC VALVE LVOT Vmax:   103.00 cm/s LVOT Vmean:  66.600 cm/s LVOT VTI:    0.214 m  AORTA Ao Root diam: 2.70 cm Ao Asc diam:  3.00 cm  MITRAL VALVE               TRICUSPID VALVE MV Area (PHT): 3.99 cm    TR Peak grad:  23.6 mmHg MV Decel Time: 190 msec    TR Vmax:        243.00 cm/s MV E velocity: 72.30 cm/s MV A velocity: 85.90 cm/s  SHUNTS MV E/A ratio:  0.84        Systemic VTI:  0.21 m Systemic Diam: 2.00 cm  Epifanio Lesches MD Electronically signed by Epifanio Lesches MD Signature Date/Time: 07/04/2022/11:35:40 AM    Final   MONITORS  LONG TERM MONITOR (3-14 DAYS) 06/24/2022  Narrative Patch Wear Time:  12 days and 9 hours (2023-08-08T11:21:57-0400 to  2023-08-20T20:47:57-0400)  Patient had a min HR of 32 bpm, max HR of 136 bpm, and avg HR of 79 bpm. Predominant underlying rhythm was Sinus Rhythm. First Degree AV Block was present. 1 run of Supraventricular Tachycardia occurred lasting 4 beats with a max rate of 122 bpm (avg 115 bpm). Second Degree AV Block-Mobitz II, single non-conducted P-wave was present. Isolated SVEs were rare (<1.0%), SVE Couplets were rare (<1.0%), and no SVE Triplets were present. Isolated VEs were rare (<1.0%), VE Couplets were rare (<1.0%), and no VE Triplets were present.  One brief run of SVT lasting 4 beats was noted, otherwise, no significant arrhythmias or ectopy.  One non-conducted beat was noted (Mobitz II).  Chrystie Nose, MD, [facility], FACP [facility] Medical Director of the [facility]ovascular [facility] Diplomate of the American Board of Clinical Lipidology Attending Cardiologist Direct Dial: [phone]  Fax: [phone] Website:  [email or web address]  CT SCANS  CT CORONARY MORPH W/CTA COR W/SCORE 04/16/2021  Addendum 04/16/2021  5:46 PM ADDENDUM REPORT: 04/16/2021 17:43  HISTORY: 63 yo female with chest pain, nonspecific  EXAM: Cardiac/Coronary CTA  TECHNIQUE: The patient was scanned on a [employer].  PROTOCOL: A 100 kV prospective scan was triggered in the descending thoracic aorta at 111 HU's. Axial non-contrast 3 mm slices were carried out through the heart. The data set was analyzed on a dedicated work station and scored using the Agatson method. Gantry rotation speed was 250 msecs and collimation was .6 mm. Beta blockade and 0.8 mg of sl NTG was given. The 3D data set was reconstructed in 5% intervals of the 67-82 % of the R-R cycle. Diastolic phases were analyzed on a dedicated work station using MPR, MIP and VRT modes. The patient received 95mL OMNIPAQUE IOHEXOL 350 MG/ML SOLN of contrast.  FINDINGS: Quality: Good,  HR 72  Coronary calcium score: The patient's coronary artery calcium score is 0, which places the patient in the 0 percentile.  Coronary arteries: Normal coronary origins.  Right dominance.  Right Coronary Artery: Dominant.  Normal.  Left Main Coronary Artery: Normal. Bifurcates into the LAD and LCx with the LAD takeoff at a 90 degree angle  Left Anterior Descending Coronary Artery: Normal anterior vessel without disease, wraps around the apex.  Left Circumflex Artery: Tortuous vessel without disease.  Aorta: Normal size, 30 mm at the mid ascending aorta (level of the PA bifurcation) measured double oblique. Aortic atherosclerosis. No dissection.  Aortic Valve: Trileaflet.  No calcifications.  Other findings:  Normal pulmonary vein drainage into the left atrium.  Normal left atrial appendage without a thrombus.  Normal size of the pulmonary artery.  IMPRESSION: 1. No evidence of CAD, CADRADS = 0.  2. Coronary calcium score of 0. This was 0 percentile for age and sex matched control.  3. Normal coronary origin with right dominance.  4. Aortic atherosclerosis  5. Consider non-cardiac causes of  chest pain   Electronically Signed By: Chrystie Nose M.D. On: 04/16/2021 17:43  Narrative EXAM: OVER-READ INTERPRETATION  CT CHEST  The following report is an over-read performed by radiologist Dr. Trudie Reed of [facility] on 04/16/2021. This over-read does not include interpretation of cardiac or coronary anatomy or pathology. The coronary calcium score/coronary CTA interpretation by the cardiologist is attached.  COMPARISON:  None.  FINDINGS: Tiny 3 mm pulmonary nodules noted in the lower lobes of the lungs bilaterally (axial image 47 of series 11 in the right lower lobe and axial image 44 of series 11 in the left lower lobe). Within the visualized portions of the thorax there are no other larger more suspicious appearing pulmonary nodules or  masses, there is no acute consolidative airspace disease, no pleural effusions, no pneumothorax and no lymphadenopathy. Visualized portions of the upper abdomen are unremarkable. There are no aggressive appearing lytic or blastic lesions noted in the visualized portions of the skeleton. Spinal cord stimulator in the midthoracic region incidentally noted.  IMPRESSION: 1. Small 3 mm pulmonary nodules in the lower lobes of the lungs bilaterally, nonspecific, but statistically likely benign. No follow-up needed if patient is low-risk (and has no known or suspected primary neoplasm). Non-contrast chest CT can be considered in 12 months if patient is high-risk. This recommendation follows the consensus statement: Guidelines for Management of Incidental Pulmonary Nodules Detected on CT Images: From the Fleischner Society 2017; Radiology 2017; 284:228-243.  Electronically Signed: By: Trudie Reed M.D. On: 04/16/2021 12:13  CARDIAC MRI  MR CARDIAC MORPHOLOGY W WO CONTRAST 08/26/2021  Narrative CLINICAL DATA:  Hypertrophic cardiomyopathy suspected, further testing  COMPARISON: Echocardiogram 01/29/21, 2/19/222  EXAM: CARDIAC MRI  TECHNIQUE: The patient was scanned on a 1.5 Tesla GE magnet. A dedicated cardiac coil was used. Functional imaging was done using Fiesta sequences. 2,3, and 4 chamber views were done to assess for RWMA's. Modified Simpson's rule using a short axis stack was used to calculate an ejection fraction on a dedicated work [employer]. The patient received 8mL GADAVIST GADOBUTROL 1 MMOL/ML IV SOLN. After 10 minutes inversion recovery sequences were used to assess for infiltration and scar tissue.  This examination is tailored for evaluation cardiac anatomy and function and provides very limited assessment of noncardiac structures, which are accordingly not evaluated during interpretation. If there is clinical concern for  extracardiac pathology, further evaluation with CT imaging should be considered.  FINDINGS: LEFT VENTRICLE:  Normal left ventricular chamber size.  Mild asymmetric basal septal hypertrophy with otherwise normal left ventricular wall thickness. Maximal wall thickness 10 mm in the basal septum. 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  No LVOT obstruction (no flow dephasing) or systolic anterior motion of the mitral valve.  Normal left ventricular systolic function.  LVEF = 51%  There are no regional wall motion abnormalities.  No myocardial edema, T2 = 50 msec  Normal first pass perfusion.  There is no post contrast delayed myocardial enhancement.  Normal T1 myocardial nulling kinetics suggest against a diagnosis of cardiac amyloidosis.  ECV = 25%  RIGHT VENTRICLE:  Normal right ventricular chamber size.  Normal right ventricular wall thickness.  Normal right ventricular systolic function.  RVEF = 49%  There are no regional wall motion abnormalities.  No post contrast delayed myocardial enhancement.  ATRIA:  Normal left atrial size.  Normal right atrial size.  VALVES:  No significant valvular abnormalities.  Tricuspid aortic valve.  PERICARDIUM:  Normal pericardium.  Trace pericardial fluid.  OTHER: No significant extracardiac findings.  MEASUREMENTS: Left ventricle:  LV Female  LV EF: 51% (normal 56-78%)  Absolute volumes:  LV EDV: (normal 52-141 mL)  LV ESV: 57mL (Normal 13-51 mL)  LV SV: 58mL (Normal 33-97 mL)  CO: 3.8L/min (Normal 2.7-6.0 L/min)  Indexed volumes:  LV EDV: 22mL/sq-m (normal 41-81 mL/sq-m)  LV ESV: 57mL/sq-m (Normal 12-21 mL/sq-m)  LV SV: 80mL/sq-m (Normal 26-56 mL/sq-m)  CI: 2.05L/min/sq-m (Normal 1.8-3.8 L/min/sq-m)  Right ventricle:  RV female  RV EF: 49% (normal 47-80%)  Absolute volumes:  RV EDV: 118 mL  (Normal 58-154 mL)  RV ESV: 60 mL (Normal 12-68 mL)  RV SV: 58 mL (Normal 35-98 mL)  CO: 3.8 L/min (Normal 2.7-6 L/min)  Indexed volumes:  RV EDV: 63 ML/sq-m (Normal 48-87 mL/sq-m)  RV ESV: 32 mL/sq-m (Normal 11-28 mL/sq-m)  RV SV: 31 mL/sq-m (Normal 27-57 mL/sq-m)  CI: 2.04 L/min/sq-m (Normal 1.8-3.8 L/min/sq-m)  IMPRESSION: 1. Mild asymmetric basal septal hypertrophy with otherwise normal left ventricular wall thickness. Maximal wall thickness 10 mm in the basal septum. 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  2.  Normal biventricular chamber size and function.  3. No post-contrast delayed myocardial enhancement or myocardial edema. ECV 25% (normal range). No evidence of scar, fibrosis, inflammation, infarction or infiltrative process.  Finding in combination are not consistent with hypertrophic cardiomyopathy.   Electronically Signed By: Weston Brass M.D. On: 08/27/2021 17:29           Current Reported Medications:.    Current Meds  Medication Sig   acetaminophen (TYLENOL) 500 MG tablet Take 1,000 mg by mouth every 6 (six) hours as needed for headache.   ALPRAZolam (XANAX) 0.5 MG tablet Take 1/2 - 1 tablet by mouth at bedtime as needed for anxiety.   amitriptyline (ELAVIL) 25 MG tablet Take 1 tablet (25 mg total) by mouth daily.   amLODipine (NORVASC) 5 MG tablet Take 1 tablet (5 mg total) by mouth daily.   atorvastatin (LIPITOR) 20 MG tablet Take 1 tablet (20 mg total) by mouth daily.   baclofen (LIORESAL) 10 MG tablet Take 1 tablet (10 mg total) by mouth 3 (three) times daily as needed.   betamethasone dipropionate 0.05 % cream Apply as directed to skin once a day to itchy areas only avoiding normal skin as needed   busPIRone (BUSPAR) 5 MG tablet Take 1 tablet by mouth 2  times daily. (Patient taking differently: Take 5 mg by mouth 2 (two) times daily as needed (anxiety).)    cholecalciferol (VITAMIN D3) 25 MCG (1000 UNIT) tablet Take 1,000 Units by mouth daily.   clotrimazole-betamethasone (LOTRISONE) cream APPLY TO THE AFFECTED AND SURROUNDING AREAS OF SKIN BY TOPICAL ROUTE 2 TIMES PER DAY IN THE MORNING AND EVENING FOR 2 WEEKS   diphenhydrAMINE (BENADRYL) 25 MG tablet Take 25 mg by mouth daily as needed for itching.   DULoxetine (CYMBALTA) 30 MG capsule Take 2 capsules (60 mg total) by mouth daily.   Estradiol Acetate (FEMRING) 0.05 MG/24HR RING USE ONE RING VAGINALLY EVERY 3 MONTHS AS DIRECTED   fluconazole (DIFLUCAN) 200 MG tablet Take 1 tablet by mouth once a week   gabapentin (NEURONTIN) 600 MG tablet Take 1 tablet (600 mg total) by mouth 3 (three) times daily.   hydrALAZINE (APRESOLINE) 50 MG tablet Take 1 tablet (50 mg total) by mouth 3 (  three) times daily.   HYDROcodone-acetaminophen (NORCO/VICODIN) 5-325 MG tablet Take 1 tablet by mouth every 6 (six) hours as needed for pain   meclizine (ANTIVERT) 25 MG tablet Take 25 mg by mouth 3 (three) times daily as needed for dizziness.   methocarbamol (ROBAXIN) 500 MG tablet Take 1 tablet (500 mg total) by mouth 3 (three) times daily as needed for muscle spasm.   ondansetron (ZOFRAN) 4 MG tablet Take 1 tablet (4 mg tablet) 30 - 60 minutes each prep dose   pregabalin (LYRICA) 300 MG capsule Take 1 capsule (300 mg total) by mouth 2 (two) times daily.   spironolactone (ALDACTONE) 50 MG tablet Take 1 tablet (50 mg total) by mouth daily.   telmisartan (MICARDIS) 40 MG tablet    traMADol (ULTRAM) 50 MG tablet Take 1 tablet (50 mg total) by mouth every 6 (six) hours as needed.   [DISCONTINUED] hydrALAZINE (APRESOLINE) 50 MG tablet Take 1 tablet (50 mg total) by mouth in the morning AND 1 tablet (50 mg total) at bedtime.    Physical Exam:    VS:  BP (!) 158/92   Pulse 83   Ht 5\' 6"  (1.676 m)   Wt 169 lb (76.7 kg)   SpO2 98%   BMI 27.28 kg/m    Wt Readings from Last 3 Encounters:  11/12/23 169 lb (76.7 kg)  10/02/23  169 lb (76.7 kg)  09/30/23 169 lb (76.7 kg)    GEN: Well nourished, well developed in no acute distress NECK: No JVD; No carotid bruits CARDIAC: RRR, no murmurs, rubs, gallops RESPIRATORY:  Clear to auscultation without rales, wheezing or rhonchi  ABDOMEN: Soft, non-tender, non-distended EXTREMITIES:  No edema; No acute deformity   Asessement and Plan:.    HTN/LVH: Blood pressure today 154/98, on recheck was 158/92.  She denies headache, vision changes, dizziness or lightheadedness.  She notes that her pain has been severe lately and is likely causing increases in her blood pressure. Previous renin aldosterone level elevated but aldosterone itself was normal.  Subsequent salt loading with normal 24-hour sodium.  Metanephrines/catecholamines, TSH unremarkable.  Previous tested for OSA was negative. Patient previously did not tolerate higher dose of amlodipine due to edema.  Reviewed ED precautions. Continue amlodipine 5 mg daily, carvedilol 25 mg twice daily, spironolactone 50 mg daily, telmisartan 40 mg daily.  Increase hydralazine 50 mg to 3 times daily.  Encouraged patient to monitor blood pressure at home as when her pain improves she will likely need to return to hydralazine twice a day.   Palpitations/syncope/carotid artery stenosis: ZIO in 05/2022 with no significant arrhythmia.  Echo in 2023 with normal LVEF and no significant valvular abnormalities.  Carotid duplex 06/2022 with only mild left ICA stenosis, 1 to 39% repeat carotids on 06/23/2023 indicated bilateral 1 to 39% stenosis.  Patient denies any palpitations or recurrent syncope or presyncope.  HLD: On atorvastatin 20 mg daily. LDL goal less than 70.   Left lower extremity pain: Patient notes a severe cramping pain in her left calf and left shin that awakens her from sleep, she also notes 1 episode while attending physical therapy.  There is no edema, redness or pain with palpation today.  She has good capillary refill and strong pulses  at the foot. Check ABIs.   Preoperative cardiac exam: Patient reports that she is to undergo back surgery soon, clearance request not yet received. Ms. Greenbaum's perioperative risk of a major cardiac event is 0.4% according to the Revised Cardiac Risk Index (  RCRI).  Therefore, she is at low risk for perioperative complications.   Her functional capacity is good at 6.76 METs according to the [place] Activity Status Index (DASI).Recommendations: According to ACC/AHA guidelines, no further cardiovascular testing needed.  The patient may proceed to surgery at acceptable risk.      Disposition: F/u with Dr. Rennis Golden on 12/28/23 as scheduled.   Signed, Rip Harbour, NP

## 2023-11-12 ENCOUNTER — Other Ambulatory Visit: Payer: Self-pay | Admitting: Neurological Surgery

## 2023-11-12 ENCOUNTER — Encounter: Payer: Self-pay | Admitting: Physical Therapy

## 2023-11-12 ENCOUNTER — Ambulatory Visit: Payer: PPO | Attending: Cardiology | Admitting: Cardiology

## 2023-11-12 ENCOUNTER — Encounter: Payer: Self-pay | Admitting: Cardiology

## 2023-11-12 ENCOUNTER — Ambulatory Visit: Payer: PPO | Admitting: Physical Therapy

## 2023-11-12 ENCOUNTER — Other Ambulatory Visit (HOSPITAL_COMMUNITY): Payer: Self-pay

## 2023-11-12 VITALS — BP 160/94 | HR 80

## 2023-11-12 VITALS — BP 158/92 | HR 83 | Ht 66.0 in | Wt 169.0 lb

## 2023-11-12 DIAGNOSIS — I517 Cardiomegaly: Secondary | ICD-10-CM | POA: Diagnosis not present

## 2023-11-12 DIAGNOSIS — R002 Palpitations: Secondary | ICD-10-CM | POA: Diagnosis not present

## 2023-11-12 DIAGNOSIS — M6281 Muscle weakness (generalized): Secondary | ICD-10-CM | POA: Diagnosis not present

## 2023-11-12 DIAGNOSIS — I5032 Chronic diastolic (congestive) heart failure: Secondary | ICD-10-CM

## 2023-11-12 DIAGNOSIS — M79605 Pain in left leg: Secondary | ICD-10-CM

## 2023-11-12 DIAGNOSIS — Z01810 Encounter for preprocedural cardiovascular examination: Secondary | ICD-10-CM | POA: Diagnosis not present

## 2023-11-12 DIAGNOSIS — I10 Essential (primary) hypertension: Secondary | ICD-10-CM

## 2023-11-12 DIAGNOSIS — R2689 Other abnormalities of gait and mobility: Secondary | ICD-10-CM

## 2023-11-12 DIAGNOSIS — E269 Hyperaldosteronism, unspecified: Secondary | ICD-10-CM

## 2023-11-12 MED ORDER — HYDRALAZINE HCL 50 MG PO TABS
50.0000 mg | ORAL_TABLET | Freq: Three times a day (TID) | ORAL | 3 refills | Status: DC
  Filled 2023-11-12: qty 270, 90d supply, fill #0

## 2023-11-12 NOTE — Patient Instructions (Addendum)
Medication Instructions:  Increase Hydralazine to 50 mg three times a day *If you need a refill on your cardiac medications before your next appointment, please call your pharmacy*  Lab Work: No labs If you have labs (blood work) drawn today and your tests are completely normal, you will receive your results only by: MyChart Message (if you have MyChart) OR A paper copy in the mail If you have any lab test that is abnormal or we need to change your treatment, we will call you to review the results.  Testing/Procedures: Your physician has requested that you have an ankle brachial index (ABI). During this test an ultrasound and blood pressure cuff are used to evaluate the arteries that supply the arms and legs with blood. Allow thirty minutes for this exam. There are no restrictions or special instructions. This will take place at [address] [facility] [address].   Please note: We ask at that you not bring children with you during ultrasound (echo/ vascular) testing. Due to room size and safety concerns, children are not allowed in the ultrasound rooms during exams. Our front office staff cannot provide observation of children in our lobby area while testing is being conducted. An adult accompanying a patient to their appointment will only be allowed in the ultrasound room at the discretion of the ultrasound technician under special circumstances. We apologize for any inconvenience.  Follow-Up: At [facility], you and your health needs are our priority.  As part of our continuing mission to provide you with exceptional heart care, we have created designated Provider Care Teams.  These Care Teams include your primary Cardiologist (physician) and Advanced Practice Providers (APPs -  Physician Assistants and Nurse Practitioners) who all work together to provide you with the care you need, when you need it.  We recommend signing up for the patient portal called "MyChart".  Sign up information is  provided on this After Visit Summary.  MyChart is used to connect with patients for Virtual Visits (Telemedicine).  Patients are able to view lab/test results, encounter notes, upcoming appointments, etc.  Non-urgent messages can be sent to your provider as well.   To learn more about what you can do with MyChart, go to [email or web address].    Your next appointment:   March 3rd at 1:30 pm with Chrystie Nose, MD     Other Instructions

## 2023-11-12 NOTE — Therapy (Signed)
OUTPATIENT PHYSICAL THERAPY NEURO TREATMENT NOTE/DISCHARGE SUMMARY      Patient Name: [name] MRN: [id number] DOB:[birth year], 63 y.o., female Today's Date: 11/12/2023   PCP: [place], [name]., MD REFERRING PROVIDER: Ranelle Oyster, MD  END OF SESSION:  PT End of Session - 11/12/23 1917     Visit Number 37    Number of Visits 38    Date for PT Re-Evaluation 11/13/23    Authorization Type [employer]    Authorization Time Period 04-21-23 - 06-27-23;  06-25-23 - 08-27-23;  09-01-23 - 10-27-23;  10-27-23 - 12-25-23    Progress Note Due on Visit 40    PT Start Time 0928    PT Stop Time 1016    PT Time Calculation (min) 48 min    Equipment Utilized During Treatment Gait belt    Activity Tolerance Patient limited by pain   limited by RLE weakness   Behavior During Therapy [facility] for tasks assessed/performed                                             Past Medical History:  Diagnosis Date   Anxiety    Aortic atherosclerosis (HCC) 04/16/2021   Atypical angina (HCC)    Back pain    related to spinal stenosis and disc problem, radiates down left buttocks to leg., weakness occ.   Chest pain    a. 03/2015 Cath: nl cors; b. 03/2021 Cor CTA: Ca2+ = 0. Nl Cors.   Chronic pain syndrome    Dyspnea    GERD (gastroesophageal reflux disease)    Grade I diastolic dysfunction    Headache    Hyperlipidemia    Hypertension    Lumbar post-laminectomy syndrome    LVH (left ventricular hypertrophy) 12/15/2020   a. 11/2020 Echo: EF 65-70%, no rwma, sev asymm LVH with IVSd 1.9 cm. No LVOT obs @ rest. Gr1 DD. Triv MR.   PONV (postoperative nausea and vomiting)    Pulmonary nodules    a. 03/2021 CT Chest: 3mm pulm nodules in bilat lower lobes. F/u 1 yr.   Right foot drop    Syncope    a. 11/2020 Zio: No significant arrhythmias.   Vaginal foreign object    "Uses Femring"   Past Surgical History:  Procedure Laterality Date   ABDOMINAL  HYSTERECTOMY     ANTERIOR CERVICAL DECOMP/DISCECTOMY FUSION N/A 01/03/2022   Procedure: Anterior Cervical Decompression Fusion - Cervical four-Cervical five - Cervical five-Cervical six;  Surgeon: Tia Alert, MD;  Location: [facility] OR;  Service: Neurosurgery;  Laterality: N/A;   BIOPSY  12/16/2020   Procedure: BIOPSY;  Surgeon: Meryl Dare, MD;  Location: [facility] ENDOSCOPY;  Service: Endoscopy;;   CARDIAC CATHETERIZATION N/A 04/18/2015   Procedure: Left Heart Cath and Coronary Angiography;  Surgeon: Rinaldo Cloud, MD;  Location: [facility] INVASIVE CV LAB;  Service: Cardiovascular;  Laterality: N/A;   COLONOSCOPY     COLONOSCOPY W/ BIOPSIES AND POLYPECTOMY  2018   ESOPHAGOGASTRODUODENOSCOPY (EGD) WITH PROPOFOL N/A 12/16/2020   Procedure: ESOPHAGOGASTRODUODENOSCOPY (EGD) WITH PROPOFOL;  Surgeon: Meryl Dare, MD;  Location: [facility] ENDOSCOPY;  Service: Endoscopy;  Laterality: N/A;   FOOT SURGERY Bilateral    [facility] "bunion,bone spur, tendon" (1) -6'16, (1)-10'16   HEMATOMA EVACUATION N/A 01/05/2022   Procedure: Cervical Wound Exploration;  Surgeon: Coletta Memos, MD;  Location: [facility] OR;  Service:  Neurosurgery;  Laterality: N/A;   IR EPIDUROGRAPHY  07/21/2018   LUMBAR LAMINECTOMY/DECOMPRESSION MICRODISCECTOMY Bilateral 12/28/2015   Procedure: MICRO LUMBAR DECOMPRESSION L4 - L5 BILATERALLY;  Surgeon: Jene Every, MD;  Location: [facility] ORS;  Service: Orthopedics;  Laterality: Bilateral;   LUMBAR LAMINECTOMY/DECOMPRESSION MICRODISCECTOMY Bilateral 03/04/2018   Procedure: Revision of Microlumbar Decompression Bilateral Lumbar Four-Five;  Surgeon: Jene Every, MD;  Location: [facility] OR;  Service: Orthopedics;  Laterality: Bilateral;  90 mins   SAVORY DILATION N/A 12/16/2020   Procedure: SAVORY DILATION;  Surgeon: Meryl Dare, MD;  Location: [facility] ENDOSCOPY;  Service: Endoscopy;  Laterality: N/A;   SPINAL CORD STIMULATOR INSERTION N/A 09/28/2019   Procedure: THORACIC SPINAL CORD STIMULATOR INSERTION;   Surgeon: Venita Lick, MD;  Location: [facility] OR;  Service: Orthopedics;  Laterality: N/A;  2.5 hrs   SPINAL CORD STIMULATOR REMOVAL N/A 05/27/2021   Procedure: LUMBAR SPINAL CORD STIMULATOR REMOVAL;  Surgeon: Lucy Chris, MD;  Location: [facility] ORS;  Service: Neurosurgery;  Laterality: N/A;   TUBAL LIGATION     WISDOM TOOTH EXTRACTION     WOUND EXPLORATION N/A 03/04/2018   Procedure: EXPLORATION OF LUMBAR DECOMPRESSION WOUND;  Surgeon: Jene Every, MD;  Location: [facility] OR;  Service: Orthopedics;  Laterality: N/A;   Patient Active Problem List   Diagnosis Date Noted   Pain in thumb joint with movement of right hand 08/12/2023   Localized primary osteoarthritis of carpometacarpal joint of right thumb 07/29/2023   Right foot drop 07/29/2023   Frequent falls 04/14/2023   Gastroesophageal reflux disease 04/14/2023   Right upper extremity numbness 09/29/2022   Stage 3a chronic kidney disease (HCC) 09/29/2022   Cervical spondylosis with radiculopathy 01/05/2022   S/P cervical spinal fusion 01/03/2022   Cervical disc disorder with radiculopathy of cervical region 11/27/2021   Uncontrolled pain 05/27/2021   Syncope 05/13/2021   Depression with anxiety 05/13/2021   Chronic diastolic CHF (congestive heart failure) (HCC) 05/13/2021   Chronic back pain 05/13/2021   Chest tightness 05/13/2021   Lower abdominal pain 05/13/2021   Syncope and collapse 01/01/2021   Abnormal echocardiogram 01/01/2021   BPPV (benign paroxysmal positional vertigo), left 12/26/2020   Odynophagia    Dysphagia 12/15/2020   Postural dizziness with presyncope 12/14/2020   Lumbar post-laminectomy syndrome    Chronic pain 09/28/2019   Lumbar spinal stenosis    Radicular pain    Gait disorder    Difficulty in urination 07/13/2018   Back pain 07/12/2018   Hyperlipidemia 04/16/2018   GAD (generalized anxiety disorder) 04/16/2018   AKI (acute kidney injury) (HCC)    Benign essential HTN    Major depression, recurrent (HCC)     Lumbar radiculopathy 03/09/2018   Paraparesis (HCC) 03/09/2018   Essential hypertension    Chronic nonintractable headache    Leukocytosis    Acute blood loss anemia    Postoperative pain    Generalized weakness    Spinal stenosis at L4-L5 level 03/04/2018   Spinal stenosis of lumbar region 12/28/2015   Chest pain 04/16/2015    ONSET DATE: Referral date 04-01-23  REFERRING DIAG: M54.16 (ICD-10-CM) - Lumbar radiculopathy  THERAPY DIAG:  Muscle weakness (generalized)  Other abnormalities of gait and mobility  Rationale for Evaluation and Treatment: Rehabilitation  SUBJECTIVE:  SUBJECTIVE STATEMENT:  Pt reports she ended up taking Hydrocodone on Tuesday which did help to reduce the pain, but didn't take it last night because she knew she had to drive to PT this morning. Pt reports the burning sensation in her back is getting worse - is more frequent than it used to be.  Has appt with cardiology this afternoon to get clearance for back surgery  Pt accompanied by: self  PERTINENT HISTORY: Frequent falls:  Spinal cord stimulator insertion on 09-28-19; stimulator removal on 05-27-21 with resultant LLE weakness;  decompression and laminectomy on 12/28/15 at L4-L5  with redo decompression surgery on May 9,2019, post-op paraparesis with surgery to remove small hematoma; 07/12/18 fall at work (involved rolling chair) with hospital admission, transfer to CIR with d/c home 08/03/18; HTN, Bil foot surgery; left ventricular hypertrophy, chronic pain syndrome, dyspnea, hypertrophic cardiomyopathy; cervical fusion March 2023;  had carpal tunnel surgery RUE in April 2024:  LVH  PAIN:  11-12-23  Are you having pain? Intensity: 7/10 in low back:   "does not pay attention to the pain in my hand" Location:  low back, Rt  hand Description: soreness in low back;  radiates down Rt lateral leg to the knee:  burning sensation in Rt hand Duration; varies depending on activity or movement being performed - worse with bending over or pulling leg up to chest Aggravating factors - certain movements; unable to turn or rotate Alleviating factor - heating pad helps some; pain medication  PRECAUTIONS: Fall  WEIGHT BEARING RESTRICTIONS: No  FALLS: Has patient fallen in last 6 months? Yes. Number of falls 3  LIVING ENVIRONMENT: Lives with: lives with their spouse Lives in: House/apartment Stairs: Yes: External: 5 steps; on right going up Has following equipment at home: Single point cane, Wheelchair (manual), and L[place]strand crutch  PLOF: Independent with basic ADLs, Independent with household mobility with device, Independent with community mobility with device, and Independent with transfers  PATIENT GOALS: improve strength Rt leg  OBJECTIVE:   DIAGNOSTIC FINDINGS: IMPRESSION: 1. Interval anterior discectomy and fusion from C4 through C6 with improved patency of the spinal canal and only mild residual foraminal narrowing. 2. Stable mild spinal stenosis and mild to moderate left foraminal narrowing at C3-4. 3. Stable mild spinal stenosis and mild foraminal narrowing bilaterally at C6-7. Mild foraminal narrowing bilaterally at C7-T1. 4. No cord deformity or abnormal cord signal.  No acute findings. No recent MRI of lumbar region  LOWER EXTREMITY MMT:    MMT Right Eval Left Eval  Hip flexion 3+   Hip extension    Hip abduction    Hip adduction    Hip internal rotation    Hip external rotation    Knee flexion 3-   Knee extension 3-   Ankle dorsiflexion 2-   Ankle plantarflexion    Ankle inversion    Ankle eversion    (Blank rows = not tested)   Today's Treatment: 11-12-23  TherEx:  Pelvic tilt 5 sec hold x 10 reps Glut isometrics  10 reps, 5 sec hold Rt Hip flexion in hooklying 10 reps -  assisted - very small ROM to minimize pain Assisted RLE heel slides 10 reps - small ROM Bil. Hip abdct. In hooklying 10 reps Small range trunk rotation - 10 reps - small range to each side with approx. 3 sec hold SAQ RLE 10 reps - no weight used   SciFit level 2.0 x 10" with bil. Ue's and LE's - pt reported no back  pain with this exercise  GAIT: Gait pattern: decreased step length- Right, decreased stance time- Right, decreased hip/knee flexion- Right, and decreased ankle dorsiflexion- Right Distance walked: clinic distances Assistive device utilized: rollator  Level of assistance: SBA to CGA Comments: blue rocker AFO donned on RLE      05-07-23: Gait velocity: with [place] Walk on AFO - 1"18 secs with rollator = .42 ft/sec   With Toe off AFO 31.9 secs with rollator = 1.03 ft/sec  Gait velocity = 32.62 secs = 1.01 ft/sec with RW    HEP - Medbridge  Access Code: [email or web address] URL: [email or web address] Date: 05/06/2023 Prepared by: Maebelle Munroe  Exercises - Beginner Bridge  - 1 x daily - 7 x weekly - 1 sets - 10 reps - 3 hold - Supine Heel Slide  - 1 x daily - 7 x weekly - 1 sets - 10 reps - Bent Knee Fallouts  - 1 x daily - 7 x weekly - 1 sets - 10 reps - Sidelying Hip Abduction  - 1 x daily - 7 x weekly - 1 sets - 10 reps - Seated Knee Flexion Extension AROM   - 1 x daily - 7 x weekly - 1 sets - 10 reps   PATIENT EDUCATION: Education details:  cont with HEP Person educated: Patient Education method: Explanation, demonstration, handouts  Education comprehension: verbalized understanding, demonstrated understanding  HOME EXERCISE PROGRAM: To be issued  GOALS: Goals reviewed with patient? Yes          NEW LONG TERM GOALS:  Target date:  08-21-23    NEW TARGET DATE:  10-09-23     1.  Amb. 300' with RW with blue rocker AFO on RLE with SBA without requiring seated rest period.    Baseline;  180', 57' with rollator;  59' with rollator --  09-01-23    Status:  Not met 10-27-23 due to back pain  2.  Improve TUG score from 22.25 secs to </= 15 secs with use of rollator.  REVISED:  score </= 19 secs  Baseline:  22.25 secs with rollator & blue rocker AFO on 06-25-23; 30.78 secs with RW  Status:  Not met 10-27-23   3.  Pt will report ability to negotiate steps in her garage at home modified independently for safely entering & exiting home.  Baseline; Goal met 09-01-23  Status:  MET  4.  Increase gait velocity to >/= 1.5 ft/sec with use of AFO & rollator for increased gait efficiency.  Baseline:  With Toe off AFO 31.9 secs with rollator = 1.03 ft/sec    Status:  Not met = 1.01 ft/sec with RW with blue rocker AFO on RLE  5.  Independent in updated HEP for RLE strengthening.  Baseline:  Goal status: Goal met 08-25-23   REVISED LTG's:  TARGET DATE 10-09-23  1.  Amb. 300' with RW with blue rocker AFO on RLE with SBA without requiring seated rest period.    Baseline;  180', 64' with rollator;  12' with rollator -- 09-01-23    Status:  Deferred due to severity of back pain - 10-05-23:  Not met 10-27-23 due to back pain  2.  Improve TUG score from 22.25 secs to </= 15 secs with use of rollator.  REVISED:  score </= 19 secs  Baseline:  22.25 secs with rollator & blue rocker AFO on 06-25-23;  30.78 secs with RW  Status:  Not met 10-27-23  3.  Pt will report ability to negotiate  steps in her garage at home modified independently for safely entering & exiting home.  Baseline;   Status:  Goal met 09-01-23  4.  Increase gait velocity to >/= 1.5 ft/sec with use of AFO & rollator for increased gait efficiency.  Baseline:  With Toe off AFO 31.9 secs with rollator = 1.03 ft/sec :  10-05-23 - 59.31 secs with use of rollator = .55 ft/sec with use of rollator;  1.01 ft/sec with RW with blue rocker AFO on RLE    Status: Goal not met 10-27-23  5.  Independent in updated HEP for RLE strengthening.  Baseline:  Goal status: Goal met  08-25-23   NEW LTG's:  TARGET DATE 11-20-23  1.  Amb. 230' with RW with blue rocker AFO on RLE with SBA without requiring seated rest period.    Baseline;  180', 28' with rollator;  47' with rollator -- 09-01-23    Status:  Deferred due to severity of back pain - 10-05-23:  Not met 10-27-23 due to back pain - NOT MET 11-12-23  2.  Improve TUG score from 30.78 secs to </= 26 secs with use of rollator.   Baseline:  22.25 secs with rollator & blue rocker AFO on 06-25-23;  30.78 secs with RW  Status:  DEFERRED due to back pain - 11-12-23   3. Increase gait velocity to >/= 1.3 ft/sec with use of AFO & rollator for increased gait efficiency. Baseline:  With Toe off AFO 31.9 secs with rollator = 1.03 ft/sec :  10-05-23 - 59.31 secs with use of rollator = .55 ft/sec with use of rollator;  1.01 ft/sec with RW with blue rocker AFO on RLE   Status:  NOT MET - 11-12-23  4.  Report decreased back pain to </= 4/10 for increased comfort with mobility and ADL's. Baseline: 5/10 on 10-27-23 Goal status: NOT MET 11-12-23    ASSESSMENT:  CLINICAL IMPRESSION: Pt has not met any LTG's due to ongoing back pain with pt planning on having surgery within next 2 weeks.  Pt had made good progress with increasing RLE strength and gait, with use of Blue rocker AFO obtained in Sept. 2024.  Pt had regression in progress due to back injury with gradual progression in pain which started in Nov. 2024.  Pt continues to use rollator for assistance with ambulation and wears blue rocker AFO on RLE which provides increased knee stability in stance.  Pt is discharged due to change in medical status with urgent back surgery planned within next 2 weeks.   OBJECTIVE IMPAIRMENTS: Abnormal gait, decreased balance, decreased coordination, decreased endurance, decreased strength, and impaired tone.   ACTIVITY LIMITATIONS: carrying, bending, squatting, stairs, transfers, and locomotion level  PARTICIPATION LIMITATIONS: cleaning, laundry,  shopping, and community activity  PERSONAL FACTORS: Behavior pattern, Past/current experiences, and Time since onset of injury/illness/exacerbation are also affecting patient's functional outcome.   REHAB POTENTIAL: Good  CLINICAL DECISION MAKING: Evolving/moderate complexity  EVALUATION COMPLEXITY: Moderate  PLAN:  PT FREQUENCY: 2x/week  PT DURATION: 4 weeks  PLANNED INTERVENTIONS: Therapeutic exercises, Therapeutic activity, Neuromuscular re-education, Balance training, Gait training, Patient/Family education, Self Care, Stair training, Orthotic/Fit training, DME instructions, and Aquatic Therapy  PLAN FOR NEXT SESSION:  D/C 11-12-23  PHYSICAL THERAPY DISCHARGE SUMMARY  Visits from Start of Care: 37  Current functional level related to goals / functional outcomes: No LTG's met due to change in medical status with back surgery planned in near future.     Remaining deficits: C/o severe back  pain (surgery to be scheduled) Continued decreased independence and safety with amb. With pt using rollator for assistance with ambulation Continued RLE weakness - pt wearing blue rocker AFO on RLE  Cont. Decreased standing balance  Education / Equipment: Blue rocker AFO was obtained in Sept. 2024 from [employer]:  pt has been instructed in HEP for gentle low back stretching and RLE strengthening   Patient agrees to discharge. Patient goals were not met. Patient is being discharged due to a change in medical status.     Kary Kos, PT 11/12/2023, 7:21 PM

## 2023-11-13 ENCOUNTER — Telehealth: Payer: Self-pay | Admitting: *Deleted

## 2023-11-13 ENCOUNTER — Other Ambulatory Visit: Payer: Self-pay

## 2023-11-13 NOTE — Telephone Encounter (Signed)
   Patient Name: [name]  DOB: 1960/11/23 MRN: [id number]  Primary Cardiologist: Chrystie Nose, MD  Chart reviewed as part of pre-operative protocol coverage. Given past medical history and time since last visit, based on ACC/AHA guidelines, Carigan Basset is at acceptable risk for the planned procedure without further cardiovascular testing.   The patient was advised that if she develops new symptoms prior to surgery to contact our office to arrange for a follow-up visit, and she verbalized understanding.  I will route this recommendation to the requesting party via Epic fax function and remove from pre-op pool.  Please call with questions.  Napoleon Form, Leodis Rains, NP 11/13/2023, 10:37 AM

## 2023-11-13 NOTE — Telephone Encounter (Signed)
   Pre-operative Risk Assessment    Patient Name: Permelia Avena  DOB: [birth year] MRN: [id number]   Date of last office visit: 11/12/23 Reather Littler, NP Date of next office visit: 12/28/23 DR. HILTY   Request for Surgical Clearance    Procedure:  L4-5 LUMBAR FUSION  Date of Surgery:  Clearance 12/07/23                                Surgeon:  DR. Marikay Alar Surgeon's Group or Practice Name:  [facility] Phone number:  [phone] Fax number:  [phone] ATTN: [place]e X [id number]   Type of Clearance Requested:   - Medical ; NONE INDICATED TO BE HELD   Type of Anesthesia:  General    Additional requests/questions:    Elpidio Anis   11/13/2023, 10:29 AM

## 2023-11-20 ENCOUNTER — Other Ambulatory Visit (HOSPITAL_BASED_OUTPATIENT_CLINIC_OR_DEPARTMENT_OTHER): Payer: Self-pay

## 2023-11-30 NOTE — Pre-Procedure Instructions (Signed)
 Surgical Instructions   Your procedure is scheduled on December 07, 2023. Report to [facility] Main Entrance A at 8:30 A.M., then check in with the Admitting office. Any questions or running late day of surgery: call [phone]  Questions prior to your surgery date: call [phone], Monday-Friday, 8am-4pm. If you experience any cold or flu symptoms such as cough, fever, chills, shortness of breath, etc. between now and your scheduled surgery, please notify us  at the above number.     Remember:  Do not eat or drink after midnight the night before your surgery      Take these medicines the morning of surgery with A SIP OF WATER : amitriptyline  (ELAVIL )  amLODipine  (NORVASC )  atorvastatin  (LIPITOR)  carvedilol  (COREG )  gabapentin  (NEURONTIN )  hydrALAZINE  (APRESOLINE )   May take these medicines IF NEEDED: acetaminophen  (TYLENOL )  ALPRAZolam  (XANAX )  baclofen  (LIORESAL )  diphenhydrAMINE  (BENADRYL )  DULoxetine  (CYMBALTA )  methocarbamol  (ROBAXIN )  traMADol  (ULTRAM )  One week prior to surgery, STOP taking any Aspirin  (unless otherwise instructed by your surgeon) Aleve, Naproxen, Ibuprofen, Motrin, Advil, Goody's, BC's, all herbal medications, fish oil, and non-prescription vitamins.                     Do NOT Smoke (Tobacco/Vaping) for 24 hours prior to your procedure.  If you use a CPAP at night, you may bring your mask/headgear for your overnight stay.   You will be asked to remove any contacts, glasses, piercing's, hearing aid's, dentures/partials prior to surgery. Please bring cases for these items if needed.    Patients discharged the day of surgery will not be allowed to drive home, and someone needs to stay with them for 24 hours.  SURGICAL WAITING ROOM VISITATION Patients may have no more than 2 support people in the waiting area - these visitors may rotate.   Pre-op nurse will coordinate an appropriate time for 1 ADULT support person, who may not rotate, to  accompany patient in pre-op.  Children under the age of 46 must have an adult with them who is not the patient and must remain in the main waiting area with an adult.  If the patient needs to stay at the hospital during part of their recovery, the visitor guidelines for inpatient rooms apply.  Please refer to the [facility] website for the visitor guidelines for any additional information.   If you received a COVID test during your pre-op visit  it is requested that you wear a mask when out in public, stay away from anyone that may not be feeling well and notify your surgeon if you develop symptoms. If you have been in contact with anyone that has tested positive in the last 10 days please notify you surgeon.      Pre-operative 5 CHG Bathing Instructions   You can play a key role in reducing the risk of infection after surgery. Your skin needs to be as free of germs as possible. You can reduce the number of germs on your skin by washing with CHG (chlorhexidine  gluconate) soap before surgery. CHG is an antiseptic soap that kills germs and continues to kill germs even after washing.   DO NOT use if you have an allergy to chlorhexidine /CHG or antibacterial soaps. If your skin becomes reddened or irritated, stop using the CHG and notify one of our RNs at [phone].   Please shower with the CHG soap starting 4 days before surgery using the following schedule:     Please keep in mind  the following:  DO NOT shave, including legs and underarms, starting the day of your first shower.   You may shave your face at any point before/day of surgery.  Place clean sheets on your bed the day you start using CHG soap. Use a clean washcloth (not used since being washed) for each shower. DO NOT sleep with pets once you start using the CHG.   CHG Shower Instructions:  Wash your face and private area with normal soap. If you choose to wash your hair, wash first with your normal shampoo.  After you use  shampoo/soap, rinse your hair and body thoroughly to remove shampoo/soap residue.  Turn the water  OFF and apply about 3 tablespoons (45 ml) of CHG soap to a CLEAN washcloth.  Apply CHG soap ONLY FROM YOUR NECK DOWN TO YOUR TOES (washing for 3-5 minutes)  DO NOT use CHG soap on face, private areas, open wounds, or sores.  Pay special attention to the area where your surgery is being performed.  If you are having back surgery, having someone wash your back for you may be helpful. Wait 2 minutes after CHG soap is applied, then you may rinse off the CHG soap.  Pat dry with a clean towel  Put on clean clothes/pajamas   If you choose to wear lotion, please use ONLY the CHG-compatible lotions that are listed below.  Additional instructions for the day of surgery: DO NOT APPLY any lotions, deodorants, cologne, or perfumes.   Do not bring valuables to the hospital. [facility] is not responsible for any belongings/valuables. Do not wear nail polish, gel polish, artificial nails, or any other type of covering on natural nails (fingers and toes) Do not wear jewelry or makeup Put on clean/comfortable clothes.  Please brush your teeth.  Ask your nurse before applying any prescription medications to the skin.     CHG Compatible Lotions   Aveeno Moisturizing lotion  Cetaphil Moisturizing Cream  Cetaphil Moisturizing Lotion  Clairol Herbal Essence Moisturizing Lotion, Dry Skin  Clairol Herbal Essence Moisturizing Lotion, Extra Dry Skin  Clairol Herbal Essence Moisturizing Lotion, Normal Skin  Curel Age Defying Therapeutic Moisturizing Lotion with Alpha Hydroxy  Curel Extreme Care Body Lotion  Curel Soothing Hands Moisturizing Hand Lotion  Curel Therapeutic Moisturizing Cream, Fragrance-Free  Curel Therapeutic Moisturizing Lotion, Fragrance-Free  Curel Therapeutic Moisturizing Lotion, Original Formula  Eucerin Daily Replenishing Lotion  Eucerin Dry Skin Therapy Plus Alpha Hydroxy Crme  Eucerin  Dry Skin Therapy Plus Alpha Hydroxy Lotion  Eucerin Original Crme  Eucerin Original Lotion  Eucerin Plus Crme Eucerin Plus Lotion  Eucerin TriLipid Replenishing Lotion  Keri Anti-Bacterial Hand Lotion  Keri Deep Conditioning Original Lotion Dry Skin Formula Softly Scented  Keri Deep Conditioning Original Lotion, Fragrance Free Sensitive Skin Formula  Keri Lotion Fast Absorbing Fragrance Free Sensitive Skin Formula  Keri Lotion Fast Absorbing Softly Scented Dry Skin Formula  Keri Original Lotion  Keri Skin Renewal Lotion Keri Silky Smooth Lotion  Keri Silky Smooth Sensitive Skin Lotion  Nivea Body Creamy Conditioning Oil  Nivea Body Extra Enriched Lotion  Nivea Body Original Lotion  Nivea Body Sheer Moisturizing Lotion Nivea Crme  Nivea Skin Firming Lotion  NutraDerm 30 Skin Lotion  NutraDerm Skin Lotion  NutraDerm Therapeutic Skin Cream  NutraDerm Therapeutic Skin Lotion  ProShield Protective Hand Cream  Provon moisturizing lotion  Please read over the following fact sheets that you were given.

## 2023-12-01 ENCOUNTER — Encounter (HOSPITAL_COMMUNITY): Payer: Self-pay

## 2023-12-01 ENCOUNTER — Encounter (HOSPITAL_COMMUNITY)
Admission: RE | Admit: 2023-12-01 | Discharge: 2023-12-01 | Disposition: A | Discharge status: Home / Self Care | Payer: PPO | Source: Ambulatory Visit | Attending: Neurological Surgery | Admitting: Neurological Surgery

## 2023-12-01 ENCOUNTER — Other Ambulatory Visit: Payer: Self-pay

## 2023-12-01 VITALS — BP 127/99 | HR 98 | Temp 98.2°F | Resp 18 | Ht 66.0 in | Wt 169.0 lb

## 2023-12-01 DIAGNOSIS — K219 Gastro-esophageal reflux disease without esophagitis: Secondary | ICD-10-CM | POA: Diagnosis not present

## 2023-12-01 DIAGNOSIS — G8929 Other chronic pain: Secondary | ICD-10-CM | POA: Diagnosis not present

## 2023-12-01 DIAGNOSIS — E785 Hyperlipidemia, unspecified: Secondary | ICD-10-CM | POA: Insufficient documentation

## 2023-12-01 DIAGNOSIS — I6529 Occlusion and stenosis of unspecified carotid artery: Secondary | ICD-10-CM | POA: Insufficient documentation

## 2023-12-01 DIAGNOSIS — Z01818 Encounter for other preprocedural examination: Secondary | ICD-10-CM

## 2023-12-01 DIAGNOSIS — I10 Essential (primary) hypertension: Secondary | ICD-10-CM | POA: Insufficient documentation

## 2023-12-01 DIAGNOSIS — Z01812 Encounter for preprocedural laboratory examination: Secondary | ICD-10-CM | POA: Insufficient documentation

## 2023-12-01 DIAGNOSIS — R519 Headache, unspecified: Secondary | ICD-10-CM | POA: Insufficient documentation

## 2023-12-01 LAB — BASIC METABOLIC PANEL
Anion gap: 7 (ref 5–15)
BUN: 10 mg/dL (ref 8–23)
CO2: 29 mmol/L (ref 22–32)
Calcium: 9.3 mg/dL (ref 8.9–10.3)
Chloride: 103 mmol/L (ref 98–111)
Creatinine, Ser: 1.24 mg/dL — ABNORMAL HIGH (ref 0.44–1.00)
GFR, Estimated: 49 mL/min — ABNORMAL LOW (ref >60)
Glucose, Bld: 85 mg/dL (ref 70–99)
Potassium: 4 mmol/L (ref 3.5–5.1)
Sodium: 139 mmol/L (ref 135–145)

## 2023-12-01 LAB — CBC
HCT: 41.8 % (ref 36.0–46.0)
Hemoglobin: 13.4 g/dL (ref 12.0–15.0)
MCH: 30 pg (ref 26.0–34.0)
MCHC: 32.1 g/dL (ref 30.0–36.0)
MCV: 93.5 fL (ref 80.0–100.0)
Platelets: 303 10*3/uL (ref 150–400)
RBC: 4.47 MIL/uL (ref 3.87–5.11)
RDW: 13.4 % (ref 11.5–15.5)
WBC: 5.7 10*3/uL (ref 4.0–10.5)
nRBC: 0 % (ref 0.0–0.2)

## 2023-12-01 LAB — TYPE AND SCREEN
ABO/RH(D): B POS
Antibody Screen: NEGATIVE

## 2023-12-01 LAB — PROTIME-INR
INR: 1 (ref 0.8–1.2)
Prothrombin Time: 13.1 s (ref 11.4–15.2)

## 2023-12-01 LAB — SURGICAL PCR SCREEN
MRSA, PCR: NEGATIVE
Staphylococcus aureus: NEGATIVE

## 2023-12-01 NOTE — Progress Notes (Signed)
 PCP - Dr. Betty Jordan Cardiologist - Dr. Vinie Maxcy  PPM/ICD - denies   Chest x-ray - 05/13/21 EKG - 11/12/23 Stress Test - 04/18/15 ECHO - 07/03/22 Cardiac Cath - 04/18/15  Sleep Study - denies   DM- denies  Last dose of GLP1 agonist-  n/a   ASA/Blood Thinner Instructions: n/a   ERAS Protcol - no, NPO   COVID TEST- n/a   Anesthesia review: yes, cardiac hx (Pt states she has an appt with cardiology on 2/6 to do a test on her leg because she has been having burning in her leg)  Patient denies shortness of breath, fever, cough and chest pain at PAT appointment   All instructions explained to the patient, with a verbal understanding of the material. Patient agrees to go over the instructions while at home for a better understanding.  The opportunity to ask questions was provided.

## 2023-12-01 NOTE — Pre-Procedure Instructions (Signed)
 Surgical Instructions   Your procedure is scheduled on December 07, 2023. Report to [facility] Main Entrance A at 8:30 A.M., then check in with the Admitting office. Any questions or running late day of surgery: call [phone]  Questions prior to your surgery date: call [phone], Monday-Friday, 8am-4pm. If you experience any cold or flu symptoms such as cough, fever, chills, shortness of breath, etc. between now and your scheduled surgery, please notify us  at the above number.     Remember:  Do not eat or drink after midnight the night before your surgery      Take these medicines the morning of surgery with A SIP OF WATER : amLODipine  (NORVASC )  atorvastatin  (LIPITOR)  carvedilol  (COREG )  gabapentin  (NEURONTIN )  hydrALAZINE  (APRESOLINE )   May take these medicines IF NEEDED: acetaminophen  (TYLENOL )  ALPRAZolam  (XANAX )  baclofen  (LIORESAL )  diphenhydrAMINE  (BENADRYL )  DULoxetine  (CYMBALTA )  methocarbamol  (ROBAXIN )  traMADol  (ULTRAM )  One week prior to surgery, STOP taking any Aspirin  (unless otherwise instructed by your surgeon) Aleve, Naproxen, Ibuprofen, Motrin, Advil, Goody's, BC's, all herbal medications, fish oil, and non-prescription vitamins.                     Do NOT Smoke (Tobacco/Vaping) for 24 hours prior to your procedure.  If you use a CPAP at night, you may bring your mask/headgear for your overnight stay.   You will be asked to remove any contacts, glasses, piercing's, hearing aid's, dentures/partials prior to surgery. Please bring cases for these items if needed.    Patients discharged the day of surgery will not be allowed to drive home, and someone needs to stay with them for 24 hours.  SURGICAL WAITING ROOM VISITATION Patients may have no more than 2 support people in the waiting area - these visitors may rotate.   Pre-op nurse will coordinate an appropriate time for 1 ADULT support person, who may not rotate, to accompany patient in pre-op.   Children under the age of 68 must have an adult with them who is not the patient and must remain in the main waiting area with an adult.  If the patient needs to stay at the hospital during part of their recovery, the visitor guidelines for inpatient rooms apply.  Please refer to the [facility] website for the visitor guidelines for any additional information.   If you received a COVID test during your pre-op visit  it is requested that you wear a mask when out in public, stay away from anyone that may not be feeling well and notify your surgeon if you develop symptoms. If you have been in contact with anyone that has tested positive in the last 10 days please notify you surgeon.      Pre-operative 5 CHG Bathing Instructions   You can play a key role in reducing the risk of infection after surgery. Your skin needs to be as free of germs as possible. You can reduce the number of germs on your skin by washing with CHG (chlorhexidine  gluconate) soap before surgery. CHG is an antiseptic soap that kills germs and continues to kill germs even after washing.   DO NOT use if you have an allergy to chlorhexidine /CHG or antibacterial soaps. If your skin becomes reddened or irritated, stop using the CHG and notify one of our RNs at [phone].   Please shower with the CHG soap starting 4 days before surgery using the following schedule:     Please keep in mind the following:  DO NOT shave, including legs and underarms, starting the day of your first shower.   You may shave your face at any point before/day of surgery.  Place clean sheets on your bed the day you start using CHG soap. Use a clean washcloth (not used since being washed) for each shower. DO NOT sleep with pets once you start using the CHG.   CHG Shower Instructions:  Wash your face and private area with normal soap. If you choose to wash your hair, wash first with your normal shampoo.  After you use shampoo/soap, rinse your hair  and body thoroughly to remove shampoo/soap residue.  Turn the water  OFF and apply about 3 tablespoons (45 ml) of CHG soap to a CLEAN washcloth.  Apply CHG soap ONLY FROM YOUR NECK DOWN TO YOUR TOES (washing for 3-5 minutes)  DO NOT use CHG soap on face, private areas, open wounds, or sores.  Pay special attention to the area where your surgery is being performed.  If you are having back surgery, having someone wash your back for you may be helpful. Wait 2 minutes after CHG soap is applied, then you may rinse off the CHG soap.  Pat dry with a clean towel  Put on clean clothes/pajamas   If you choose to wear lotion, please use ONLY the CHG-compatible lotions that are listed below.  Additional instructions for the day of surgery: DO NOT APPLY any lotions, deodorants, cologne, or perfumes.   Do not bring valuables to the hospital. [facility] is not responsible for any belongings/valuables. Do not wear nail polish, gel polish, artificial nails, or any other type of covering on natural nails (fingers and toes) Do not wear jewelry or makeup Put on clean/comfortable clothes.  Please brush your teeth.  Ask your nurse before applying any prescription medications to the skin.     CHG Compatible Lotions   Aveeno Moisturizing lotion  Cetaphil Moisturizing Cream  Cetaphil Moisturizing Lotion  Clairol Herbal Essence Moisturizing Lotion, Dry Skin  Clairol Herbal Essence Moisturizing Lotion, Extra Dry Skin  Clairol Herbal Essence Moisturizing Lotion, Normal Skin  Curel Age Defying Therapeutic Moisturizing Lotion with Alpha Hydroxy  Curel Extreme Care Body Lotion  Curel Soothing Hands Moisturizing Hand Lotion  Curel Therapeutic Moisturizing Cream, Fragrance-Free  Curel Therapeutic Moisturizing Lotion, Fragrance-Free  Curel Therapeutic Moisturizing Lotion, Original Formula  Eucerin Daily Replenishing Lotion  Eucerin Dry Skin Therapy Plus Alpha Hydroxy Crme  Eucerin Dry Skin Therapy Plus Alpha  Hydroxy Lotion  Eucerin Original Crme  Eucerin Original Lotion  Eucerin Plus Crme Eucerin Plus Lotion  Eucerin TriLipid Replenishing Lotion  Keri Anti-Bacterial Hand Lotion  Keri Deep Conditioning Original Lotion Dry Skin Formula Softly Scented  Keri Deep Conditioning Original Lotion, Fragrance Free Sensitive Skin Formula  Keri Lotion Fast Absorbing Fragrance Free Sensitive Skin Formula  Keri Lotion Fast Absorbing Softly Scented Dry Skin Formula  Keri Original Lotion  Keri Skin Renewal Lotion Keri Silky Smooth Lotion  Keri Silky Smooth Sensitive Skin Lotion  Nivea Body Creamy Conditioning Oil  Nivea Body Extra Enriched Lotion  Nivea Body Original Lotion  Nivea Body Sheer Moisturizing Lotion Nivea Crme  Nivea Skin Firming Lotion  NutraDerm 30 Skin Lotion  NutraDerm Skin Lotion  NutraDerm Therapeutic Skin Cream  NutraDerm Therapeutic Skin Lotion  ProShield Protective Hand Cream  Provon moisturizing lotion  Please read over the following fact sheets that you were given.

## 2023-12-02 ENCOUNTER — Other Ambulatory Visit (HOSPITAL_COMMUNITY): Payer: Self-pay

## 2023-12-02 MED ORDER — BETAMETHASONE DIPROPIONATE 0.05 % EX CREA
TOPICAL_CREAM | CUTANEOUS | 0 refills | Status: DC
  Filled 2023-12-02: qty 45, 28d supply, fill #0

## 2023-12-02 NOTE — Progress Notes (Signed)
 Anesthesia Chart Review: Same day workup  63 year old female with pertinent history including resistant HTN, HLD, carotid stenosis. In 2016 she had a cardiac catheterization with no coronary disease.  03/2021 showed a cardiac CT with coronary calcium  score of 0.  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 On 06/16/2022 she presented for carotid duplex and became unresponsive with seizure-like activity.  She was transported to the ED but left AMA prior to workup.  She had follow-up with her PCP and was referred to neurology where she had a benign workup, normal EEG.  Subsequent sodium loading with normal 24-hour urine sodium.  Monitor in 05/2022 indicated predominantly sinus rhythm, first-degree AV block, 1 run of SVT 4 beats max rate of 122 bpm.  She had isolated nonconducted beat noticed.  Echo 07/03/2022 indicated normal LVEF, moderate LVH, no significant valve abnormalities.  Carotid duplex on 06/05/2023 indicated bilateral 1 to 39% stenosis.   She was last seen in cardiology follow-up by Katlyn West, NP on 11/12/2023.  Blood pressure elevated at that time, 158/92.  She was continued on amlodipine  5 mg daily, carvedilol  25 mg twice daily, spironolactone  50 mg daily, telmisartan  40 mg daily.  Her hydralazine  was increased to 50 mg 3 times daily.  Coming surgery was also discussed.  Per note, Patient reports that she is to undergo back surgery soon, clearance request not yet received. Ms. Nand's perioperative risk of a major cardiac event is 0.4% according to the Revised Cardiac Risk Index (RCRI).  Therefore, she is at low risk for perioperative complications.   Her functional capacity is good at 6.76 METs according to the Duke Activity Status Index  (DASI).Recommendations: According to ACC/AHA guidelines, no further cardiovascular testing needed.  The patient may proceed to surgery at acceptable risk.    Other pertinent history includes PONV, headaches, GERD, chronic pain.  Preop labs reviewed, creatinine mildly elevated 1.24, otherwise unremarkable.  EKG 06/23/2023: NSR.  Rate 83.  TTE 07/03/22:  1. Left ventricular ejection fraction, by estimation, is 60 to 65%. The  left ventricle has normal function. The left ventricle has no regional  wall motion abnormalities. There is moderate asymmetric left ventricular  hypertrophy of the basal-septal  segment. Left ventricular diastolic parameters were normal. The average  left ventricular global longitudinal strain is -20.2 %.   2. Right ventricular systolic function is normal. The right ventricular  size is normal. There is normal pulmonary artery systolic pressure. The  estimated right ventricular systolic pressure is 26.6 mmHg.   3. The mitral valve is normal in structure. Trivial mitral valve  regurgitation.   4. The aortic valve was not well visualized. Aortic valve regurgitation  is not visualized. No aortic stenosis is present.   5. The inferior vena cava is normal in size with greater than 50%  respiratory variability, suggesting right atrial pressure of 3 mmHg.   Event monitor 06/24/22: Patch Wear Time:  12 days and 9 hours (2023-08-08T11:21:57-0400 to 2023-08-20T20:47:57-0400)   Patient had a min HR of 32 bpm, max HR of 136 bpm, and avg HR of 79 bpm. Predominant underlying rhythm was Sinus Rhythm. First Degree AV Block was present. 1 run of Supraventricular Tachycardia occurred lasting 4 beats with a max rate of 122 bpm (avg 115  bpm). Second Degree AV Block-Mobitz II,  single non-conducted P-wave was present. Isolated SVEs were rare (<1.0%), SVE Couplets were rare (<1.0%), and no SVE Triplets were present. Isolated VEs were rare (<1.0%), VE Couplets were rare (<1.0%), and no VE  Triplets were  present.   One brief run of SVT lasting 4 beats was noted, otherwise, no significant arrhythmias or ectopy.  One non-conducted beat was noted (Mobitz II).  cMRI 08/26/21: IMPRESSION: 1. Mild asymmetric basal septal hypertrophy with otherwise normal left ventricular wall thickness. Maximal wall thickness 10 mm in the basal septum. 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   2.  Normal biventricular chamber size and function.   3. No post-contrast delayed myocardial enhancement or myocardial edema. ECV 25% (normal range). No evidence of scar, fibrosis, inflammation, infarction or infiltrative process.   Finding in combination are not consistent with hypertrophic cardiomyopathy.  Coronary CTA 04/16/21: IMPRESSION: 1. No evidence of CAD, CADRADS = 0.   2. Coronary calcium  score of 0. This was 0 percentile for age and sex matched control.   3. Normal coronary origin with right dominance.   4. Aortic atherosclerosis   5. Consider non-cardiac causes of chest pain     Lynwood Geofm RIGGERS [facility] Short Stay Center/Anesthesiology Phone [phone] 12/02/2023 2:06 PM

## 2023-12-02 NOTE — Anesthesia Preprocedure Evaluation (Addendum)
 Anesthesia Evaluation  Patient identified by MRN, date of birth, ID band Patient awake    Reviewed: Allergy & Precautions, NPO status , Patient's Chart, lab work & pertinent test results  History of Anesthesia Complications (+) PONV and history of anesthetic complications  Airway Mallampati: III  TM Distance: >3 FB Neck ROM: Limited    Dental   Pulmonary neg pulmonary ROS   breath sounds clear to auscultation       Cardiovascular hypertension, Pt. on medications and Pt. on home beta blockers + dysrhythmias  Rhythm:Regular Rate:Normal     Neuro/Psych  Headaches  Neuromuscular disease    GI/Hepatic Neg liver ROS,GERD  ,,  Endo/Other  negative endocrine ROS    Renal/GU CRFRenal disease     Musculoskeletal  (+) Arthritis ,    Abdominal   Peds  Hematology negative hematology ROS (+)   Anesthesia Other Findings   Reproductive/Obstetrics                             Anesthesia Physical Anesthesia Plan  ASA: 3  Anesthesia Plan: General   Post-op Pain Management: Tylenol  PO (pre-op)* and Gabapentin  PO (pre-op)*   Induction: Intravenous  PONV Risk Score and Plan: 4 or greater and Ondansetron , Dexamethasone , Midazolam  and Treatment may vary due to age or medical condition  Airway Management Planned: Oral ETT and Video Laryngoscope Planned  Additional Equipment: None  Intra-op Plan:   Post-operative Plan: Extubation in OR  Informed Consent: I have reviewed the patients History and Physical, chart, labs and discussed the procedure including the risks, benefits and alternatives for the proposed anesthesia with the patient or authorized representative who has indicated his/her understanding and acceptance.     Dental advisory given  Plan Discussed with: CRNA  Anesthesia Plan Comments: (PAT note by Lynwood Hope, PA-C: 63 year old female with pertinent history including resistant HTN, HLD,  carotid stenosis. In 2016 she had a cardiac catheterization with no coronary disease.  03/2021 showed a cardiac CT with coronary calcium  score of 0.  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 On 06/16/2022 she presented for carotid duplex and became unresponsive with seizure-like activity.  She was transported to the ED but left AMA prior to workup.  She had follow-up with her PCP and was referred to neurology where she had a benign workup, normal EEG.  Subsequent sodium loading with normal 24-hour urine sodium.  Monitor in 05/2022 indicated predominantly sinus rhythm, first-degree AV block, 1 run of SVT 4 beats max rate of 122 bpm.  She had isolated nonconducted beat noticed.  Echo 07/03/2022 indicated normal LVEF, moderate LVH, no significant valve abnormalities.  Carotid duplex on 06/05/2023 indicated bilateral 1 to 39% stenosis.   She was last seen in cardiology follow-up by Katlyn West, NP on 11/12/2023.  Blood pressure elevated at that time, 158/92.  She was continued on amlodipine  5 mg daily, carvedilol  25 mg twice daily, spironolactone  50 mg daily, telmisartan  40 mg daily.  Her hydralazine  was increased to 50 mg 3 times daily.  Coming surgery was also discussed.  Per note, Patient reports that she is to undergo back surgery soon, clearance request not yet received. Ms. Whitehurst perioperative risk of a major cardiac event is 0.4% according to the  Revised Cardiac Risk Index (RCRI).  Therefore, she is at low risk for perioperative complications.   Her functional capacity is good at 6.76 METs according to the Duke Activity Status Index (DASI).Recommendations: According to ACC/AHA guidelines, no further cardiovascular testing needed.  The patient may  proceed to surgery at acceptable risk.    Other pertinent history includes PONV, headaches, GERD, chronic pain.  Preop labs reviewed, creatinine mildly elevated 1.24, otherwise unremarkable.  EKG 06/23/2023: NSR.  Rate 83.  TTE 07/03/22: 1. Left ventricular ejection fraction, by estimation, is 60 to 65%. The  left ventricle has normal function. The left ventricle has no regional  wall motion abnormalities. There is moderate asymmetric left ventricular  hypertrophy of the basal-septal  segment. Left ventricular diastolic parameters were normal. The average  left ventricular global longitudinal strain is -20.2 %.  2. Right ventricular systolic function is normal. The right ventricular  size is normal. There is normal pulmonary artery systolic pressure. The  estimated right ventricular systolic pressure is 26.6 mmHg.  3. The mitral valve is normal in structure. Trivial mitral valve  regurgitation.  4. The aortic valve was not well visualized. Aortic valve regurgitation  is not visualized. No aortic stenosis is present.  5. The inferior vena cava is normal in size with greater than 50%  respiratory variability, suggesting right atrial pressure of 3 mmHg.   Event monitor 06/24/22: Patch Wear Time:  12 days and 9 hours (2023-08-08T11:21:57-0400 to 2023-08-20T20:47:57-0400)  Patient had a min HR of 32 bpm, max HR of 136 bpm, and avg HR of 79 bpm. Predominant underlying rhythm was Sinus Rhythm. First Degree AV Block was present. 1 run of Supraventricular Tachycardia occurred lasting 4 beats with a max rate of 122 bpm (avg 115  bpm). Second Degree AV Block-Mobitz II, single non-conducted P-wave was present. Isolated SVEs were rare (<1.0%), SVE Couplets were rare (<1.0%), and no SVE Triplets were present. Isolated VEs were rare (<1.0%), VE Couplets were rare (<1.0%), and no VE Triplets were  present.  One brief run of SVT lasting 4 beats was noted, otherwise, no significant arrhythmias or  ectopy.  One non-conducted beat was noted (Mobitz II).  cMRI 08/26/21: IMPRESSION: 1. Mild asymmetric basal septal hypertrophy with otherwise normal left ventricular wall thickness. Maximal wall thickness 10 mm in the basal septum. 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  2.  Normal biventricular chamber size and function.  3. No post-contrast delayed myocardial enhancement or myocardial edema. ECV 25% (normal range). No evidence of scar, fibrosis, inflammation, infarction or infiltrative process.  Finding in combination are not consistent with hypertrophic cardiomyopathy.  Coronary CTA 04/16/21: IMPRESSION: 1. No evidence of CAD, CADRADS = 0.  2. Coronary calcium  score of 0. This was 0 percentile for age and sex matched control.  3. Normal coronary origin with right dominance.  4. Aortic atherosclerosis  5. Consider non-cardiac causes of chest pain   )        Anesthesia Quick Evaluation

## 2023-12-03 ENCOUNTER — Ambulatory Visit (HOSPITAL_COMMUNITY)
Admission: RE | Admit: 2023-12-03 | Discharge: 2023-12-03 | Disposition: A | Discharge status: Home / Self Care | Payer: PPO | Source: Ambulatory Visit | Attending: Cardiovascular Disease | Admitting: Cardiovascular Disease

## 2023-12-03 ENCOUNTER — Other Ambulatory Visit (HOSPITAL_COMMUNITY): Payer: Self-pay

## 2023-12-03 DIAGNOSIS — M79605 Pain in left leg: Secondary | ICD-10-CM | POA: Diagnosis not present

## 2023-12-03 DIAGNOSIS — R002 Palpitations: Secondary | ICD-10-CM | POA: Diagnosis not present

## 2023-12-03 DIAGNOSIS — I517 Cardiomegaly: Secondary | ICD-10-CM | POA: Diagnosis not present

## 2023-12-03 DIAGNOSIS — E269 Hyperaldosteronism, unspecified: Secondary | ICD-10-CM | POA: Insufficient documentation

## 2023-12-03 DIAGNOSIS — I5032 Chronic diastolic (congestive) heart failure: Secondary | ICD-10-CM | POA: Insufficient documentation

## 2023-12-03 DIAGNOSIS — I10 Essential (primary) hypertension: Secondary | ICD-10-CM | POA: Insufficient documentation

## 2023-12-04 LAB — VAS US ABI WITH/WO TBI
Left ABI: 1.15
Right ABI: 1.15

## 2023-12-07 ENCOUNTER — Other Ambulatory Visit: Payer: Self-pay

## 2023-12-07 ENCOUNTER — Encounter (HOSPITAL_COMMUNITY)
Admission: RE | Disposition: A | Discharge status: Home Health | Payer: Self-pay | Source: Home / Self Care | Attending: Neurological Surgery

## 2023-12-07 ENCOUNTER — Encounter (HOSPITAL_COMMUNITY): Payer: Self-pay | Admitting: Neurological Surgery

## 2023-12-07 ENCOUNTER — Inpatient Hospital Stay (HOSPITAL_COMMUNITY)
Admission: RE | Admit: 2023-12-07 | Discharge: 2023-12-11 | DRG: 909 | Disposition: A | Discharge status: Home Health | Payer: PPO | Attending: Neurological Surgery | Admitting: Neurological Surgery

## 2023-12-07 ENCOUNTER — Ambulatory Visit (HOSPITAL_COMMUNITY): Payer: PPO

## 2023-12-07 ENCOUNTER — Ambulatory Visit (HOSPITAL_COMMUNITY): Payer: PPO | Admitting: Physician Assistant

## 2023-12-07 DIAGNOSIS — Z885 Allergy status to narcotic agent status: Secondary | ICD-10-CM

## 2023-12-07 DIAGNOSIS — M21371 Foot drop, right foot: Secondary | ICD-10-CM | POA: Diagnosis present

## 2023-12-07 DIAGNOSIS — I130 Hypertensive heart and chronic kidney disease with heart failure and stage 1 through stage 4 chronic kidney disease, or unspecified chronic kidney disease: Secondary | ICD-10-CM

## 2023-12-07 DIAGNOSIS — Z9689 Presence of other specified functional implants: Secondary | ICD-10-CM | POA: Diagnosis not present

## 2023-12-07 DIAGNOSIS — I5032 Chronic diastolic (congestive) heart failure: Secondary | ICD-10-CM | POA: Diagnosis not present

## 2023-12-07 DIAGNOSIS — Z888 Allergy status to other drugs, medicaments and biological substances status: Secondary | ICD-10-CM

## 2023-12-07 DIAGNOSIS — M9689 Other intraoperative and postprocedural complications and disorders of the musculoskeletal system: Principal | ICD-10-CM | POA: Diagnosis present

## 2023-12-07 DIAGNOSIS — I700 Atherosclerosis of aorta: Secondary | ICD-10-CM | POA: Diagnosis present

## 2023-12-07 DIAGNOSIS — E785 Hyperlipidemia, unspecified: Secondary | ICD-10-CM | POA: Diagnosis present

## 2023-12-07 DIAGNOSIS — G894 Chronic pain syndrome: Secondary | ICD-10-CM | POA: Diagnosis present

## 2023-12-07 DIAGNOSIS — K219 Gastro-esophageal reflux disease without esophagitis: Secondary | ICD-10-CM | POA: Diagnosis present

## 2023-12-07 DIAGNOSIS — I10 Essential (primary) hypertension: Secondary | ICD-10-CM | POA: Diagnosis present

## 2023-12-07 DIAGNOSIS — M5416 Radiculopathy, lumbar region: Secondary | ICD-10-CM | POA: Diagnosis not present

## 2023-12-07 DIAGNOSIS — M4316 Spondylolisthesis, lumbar region: Secondary | ICD-10-CM

## 2023-12-07 DIAGNOSIS — Z91040 Latex allergy status: Secondary | ICD-10-CM

## 2023-12-07 DIAGNOSIS — Z8249 Family history of ischemic heart disease and other diseases of the circulatory system: Secondary | ICD-10-CM

## 2023-12-07 DIAGNOSIS — Z880 Allergy status to penicillin: Secondary | ICD-10-CM

## 2023-12-07 DIAGNOSIS — M961 Postlaminectomy syndrome, not elsewhere classified: Secondary | ICD-10-CM | POA: Diagnosis not present

## 2023-12-07 DIAGNOSIS — Z981 Arthrodesis status: Principal | ICD-10-CM

## 2023-12-07 DIAGNOSIS — N1831 Chronic kidney disease, stage 3a: Secondary | ICD-10-CM | POA: Diagnosis not present

## 2023-12-07 DIAGNOSIS — Z79899 Other long term (current) drug therapy: Secondary | ICD-10-CM

## 2023-12-07 DIAGNOSIS — Y838 Other surgical procedures as the cause of abnormal reaction of the patient, or of later complication, without mention of misadventure at the time of the procedure: Secondary | ICD-10-CM | POA: Diagnosis present

## 2023-12-07 DIAGNOSIS — F419 Anxiety disorder, unspecified: Secondary | ICD-10-CM | POA: Diagnosis present

## 2023-12-07 HISTORY — PX: POSTERIOR FUSION PEDICLE SCREW PLACEMENT: SHX2186

## 2023-12-07 SURGERY — POSTERIOR LUMBAR FUSION 1 LEVEL
Anesthesia: General | Site: Back

## 2023-12-07 MED ORDER — BUPIVACAINE HCL (PF) 0.25 % IJ SOLN
INTRAMUSCULAR | Status: AC
  Filled 2023-12-07: qty 30

## 2023-12-07 MED ORDER — BACLOFEN 10 MG PO TABS
10.0000 mg | ORAL_TABLET | Freq: Three times a day (TID) | ORAL | Status: DC | PRN
  Administered 2023-12-07: 10 mg via ORAL
  Filled 2023-12-07: qty 1

## 2023-12-07 MED ORDER — SODIUM CHLORIDE 0.9% FLUSH: 3.0000 mL | INTRAVENOUS | Status: DC | PRN

## 2023-12-07 MED ORDER — ONDANSETRON HCL 4 MG/2ML IJ SOLN
INTRAMUSCULAR | Status: DC | PRN
  Administered 2023-12-07: 4 mg via INTRAVENOUS

## 2023-12-07 MED ORDER — ONDANSETRON HCL 4 MG/2ML IJ SOLN
INTRAMUSCULAR | Status: AC
  Filled 2023-12-07: qty 2

## 2023-12-07 MED ORDER — ACETAMINOPHEN 650 MG RE SUPP: 650.0000 mg | RECTAL | Status: DC | PRN

## 2023-12-07 MED ORDER — SODIUM CHLORIDE 0.9% FLUSH
3.0000 mL | Freq: Two times a day (BID) | INTRAVENOUS | Status: DC
  Administered 2023-12-08 (×2): 3 mL via INTRAVENOUS
  Administered 2023-12-09: 10 mL via INTRAVENOUS
  Administered 2023-12-10 – 2023-12-11 (×2): 3 mL via INTRAVENOUS
  Administered 2023-12-11: 10 mL via INTRAVENOUS

## 2023-12-07 MED ORDER — THROMBIN 5000 UNITS EX SOLR
CUTANEOUS | Status: AC
  Filled 2023-12-07: qty 5000

## 2023-12-07 MED ORDER — AMITRIPTYLINE HCL 25 MG PO TABS
25.0000 mg | ORAL_TABLET | Freq: Every day | ORAL | Status: DC
  Administered 2023-12-08 – 2023-12-11 (×4): 25 mg via ORAL
  Filled 2023-12-07 (×5): qty 1

## 2023-12-07 MED ORDER — BUPIVACAINE HCL (PF) 0.25 % IJ SOLN
INTRAMUSCULAR | Status: DC | PRN
  Administered 2023-12-07: 8 mL

## 2023-12-07 MED ORDER — MIDAZOLAM HCL 2 MG/2ML IJ SOLN
INTRAMUSCULAR | Status: DC | PRN
  Administered 2023-12-07: 2 mg via INTRAVENOUS

## 2023-12-07 MED ORDER — CHLORHEXIDINE GLUCONATE CLOTH 2 % EX PADS: 6.0000 | MEDICATED_PAD | Freq: Once | CUTANEOUS | Status: DC

## 2023-12-07 MED ORDER — ROCURONIUM BROMIDE 10 MG/ML (PF) SYRINGE
PREFILLED_SYRINGE | INTRAVENOUS | Status: DC | PRN
  Administered 2023-12-07: 100 mg via INTRAVENOUS

## 2023-12-07 MED ORDER — DEXAMETHASONE SODIUM PHOSPHATE 10 MG/ML IJ SOLN
INTRAMUSCULAR | Status: DC | PRN
  Administered 2023-12-07: 10 mg via INTRAVENOUS

## 2023-12-07 MED ORDER — ACETAMINOPHEN 500 MG PO TABS
1000.0000 mg | ORAL_TABLET | ORAL | Status: AC
  Administered 2023-12-07: 1000 mg via ORAL
  Filled 2023-12-07: qty 2

## 2023-12-07 MED ORDER — SUGAMMADEX SODIUM 200 MG/2ML IV SOLN
INTRAVENOUS | Status: DC | PRN
  Administered 2023-12-07: 100 mg via INTRAVENOUS
  Administered 2023-12-07: 300 mg via INTRAVENOUS

## 2023-12-07 MED ORDER — FENTANYL CITRATE (PF) 100 MCG/2ML IJ SOLN
INTRAMUSCULAR | Status: AC
Start: 2023-12-07 — End: 2023-12-08
  Filled 2023-12-07: qty 2

## 2023-12-07 MED ORDER — EPHEDRINE SULFATE-NACL 50-0.9 MG/10ML-% IV SOSY
PREFILLED_SYRINGE | INTRAVENOUS | Status: DC | PRN
  Administered 2023-12-07: 10 mg via INTRAVENOUS

## 2023-12-07 MED ORDER — DEXAMETHASONE SODIUM PHOSPHATE 10 MG/ML IJ SOLN
INTRAMUSCULAR | Status: AC
  Filled 2023-12-07: qty 1

## 2023-12-07 MED ORDER — AMISULPRIDE (ANTIEMETIC) 5 MG/2ML IV SOLN
10.0000 mg | Freq: Once | INTRAVENOUS | Status: AC | PRN
  Administered 2023-12-07: 10 mg via INTRAVENOUS

## 2023-12-07 MED ORDER — AMLODIPINE BESYLATE 5 MG PO TABS
5.0000 mg | ORAL_TABLET | Freq: Every day | ORAL | Status: DC
  Administered 2023-12-07 – 2023-12-11 (×5): 5 mg via ORAL
  Filled 2023-12-07 (×5): qty 1

## 2023-12-07 MED ORDER — MENTHOL 3 MG MT LOZG: 1.0000 | LOZENGE | OROMUCOSAL | Status: DC | PRN

## 2023-12-07 MED ORDER — LIDOCAINE 2% (20 MG/ML) 5 ML SYRINGE
INTRAMUSCULAR | Status: AC
  Filled 2023-12-07: qty 5

## 2023-12-07 MED ORDER — LIDOCAINE 2% (20 MG/ML) 5 ML SYRINGE
INTRAMUSCULAR | Status: DC | PRN
  Administered 2023-12-07: 40 mg via INTRAVENOUS

## 2023-12-07 MED ORDER — CHLORHEXIDINE GLUCONATE 0.12 % MT SOLN
15.0000 mL | Freq: Once | OROMUCOSAL | Status: AC
  Administered 2023-12-07: 15 mL via OROMUCOSAL
  Filled 2023-12-07: qty 15

## 2023-12-07 MED ORDER — VANCOMYCIN HCL IN DEXTROSE 1-5 GM/200ML-% IV SOLN
1000.0000 mg | Freq: Once | INTRAVENOUS | Status: AC
  Administered 2023-12-07: 1000 mg via INTRAVENOUS
  Filled 2023-12-07: qty 200

## 2023-12-07 MED ORDER — CARVEDILOL 12.5 MG PO TABS
50.0000 mg | ORAL_TABLET | Freq: Two times a day (BID) | ORAL | Status: DC
  Administered 2023-12-07 – 2023-12-11 (×7): 50 mg via ORAL
  Filled 2023-12-07 (×3): qty 4
  Filled 2023-12-07 (×3): qty 2
  Filled 2023-12-07 (×2): qty 4

## 2023-12-07 MED ORDER — PHENOL 1.4 % MT LIQD: 1.0000 | OROMUCOSAL | Status: DC | PRN

## 2023-12-07 MED ORDER — 0.9 % SODIUM CHLORIDE (POUR BTL) OPTIME
TOPICAL | Status: DC | PRN
  Administered 2023-12-07: 1000 mL

## 2023-12-07 MED ORDER — CELECOXIB 200 MG PO CAPS
200.0000 mg | ORAL_CAPSULE | Freq: Two times a day (BID) | ORAL | Status: DC
  Administered 2023-12-07 – 2023-12-11 (×9): 200 mg via ORAL
  Filled 2023-12-07 (×9): qty 1

## 2023-12-07 MED ORDER — TRAMADOL HCL 50 MG PO TABS
50.0000 mg | ORAL_TABLET | Freq: Four times a day (QID) | ORAL | Status: DC | PRN
  Administered 2023-12-07 (×2): 50 mg via ORAL
  Administered 2023-12-08 – 2023-12-11 (×9): 100 mg via ORAL
  Filled 2023-12-07 (×8): qty 2
  Filled 2023-12-07 (×2): qty 1
  Filled 2023-12-07: qty 2

## 2023-12-07 MED ORDER — ORAL CARE MOUTH RINSE: 15.0000 mL | Freq: Once | OROMUCOSAL | Status: AC

## 2023-12-07 MED ORDER — GABAPENTIN 300 MG PO CAPS
600.0000 mg | ORAL_CAPSULE | Freq: Three times a day (TID) | ORAL | Status: DC
  Administered 2023-12-07 – 2023-12-11 (×12): 600 mg via ORAL
  Filled 2023-12-07 (×12): qty 2

## 2023-12-07 MED ORDER — CARVEDILOL 12.5 MG PO TABS
ORAL_TABLET | ORAL | Status: AC
  Filled 2023-12-07: qty 4

## 2023-12-07 MED ORDER — ACETAMINOPHEN 325 MG PO TABS: 650.0000 mg | ORAL_TABLET | ORAL | Status: DC | PRN

## 2023-12-07 MED ORDER — HYDROMORPHONE HCL 1 MG/ML IJ SOLN
0.5000 mg | INTRAMUSCULAR | Status: DC | PRN
  Administered 2023-12-07 – 2023-12-11 (×8): 0.5 mg via INTRAVENOUS
  Filled 2023-12-07 (×9): qty 0.5

## 2023-12-07 MED ORDER — THROMBIN 20000 UNITS EX SOLR
CUTANEOUS | Status: AC
  Filled 2023-12-07: qty 20000

## 2023-12-07 MED ORDER — MIDAZOLAM HCL 2 MG/2ML IJ SOLN
INTRAMUSCULAR | Status: AC
  Filled 2023-12-07: qty 2

## 2023-12-07 MED ORDER — ONDANSETRON HCL 4 MG PO TABS: 4.0000 mg | ORAL_TABLET | Freq: Four times a day (QID) | ORAL | Status: DC | PRN

## 2023-12-07 MED ORDER — PROPOFOL 10 MG/ML IV BOLUS
INTRAVENOUS | Status: AC
  Filled 2023-12-07: qty 20

## 2023-12-07 MED ORDER — LACTATED RINGERS IV SOLN: INTRAVENOUS | Status: DC

## 2023-12-07 MED ORDER — FENTANYL CITRATE (PF) 250 MCG/5ML IJ SOLN
INTRAMUSCULAR | Status: AC
  Filled 2023-12-07: qty 5

## 2023-12-07 MED ORDER — SODIUM CHLORIDE 0.9 % IV SOLN
250.0000 mL | INTRAVENOUS | Status: AC
Start: 2023-12-07 — End: 2023-12-08
  Administered 2023-12-07: 250 mL via INTRAVENOUS

## 2023-12-07 MED ORDER — SPIRONOLACTONE 25 MG PO TABS
50.0000 mg | ORAL_TABLET | Freq: Every day | ORAL | Status: DC
  Administered 2023-12-07 – 2023-12-11 (×5): 50 mg via ORAL
  Filled 2023-12-07 (×5): qty 2

## 2023-12-07 MED ORDER — SODIUM CHLORIDE 0.9% FLUSH
3.0000 mL | Freq: Two times a day (BID) | INTRAVENOUS | Status: DC
  Administered 2023-12-08 – 2023-12-11 (×7): 3 mL via INTRAVENOUS

## 2023-12-07 MED ORDER — FENTANYL CITRATE (PF) 250 MCG/5ML IJ SOLN
INTRAMUSCULAR | Status: DC | PRN
  Administered 2023-12-07: 50 ug via INTRAVENOUS
  Administered 2023-12-07: 100 ug via INTRAVENOUS

## 2023-12-07 MED ORDER — FENTANYL CITRATE (PF) 100 MCG/2ML IJ SOLN
25.0000 ug | INTRAMUSCULAR | Status: DC | PRN
  Administered 2023-12-07 (×3): 25 ug via INTRAVENOUS

## 2023-12-07 MED ORDER — VANCOMYCIN HCL IN DEXTROSE 1-5 GM/200ML-% IV SOLN
1000.0000 mg | INTRAVENOUS | Status: AC
  Administered 2023-12-07: 1000 mg via INTRAVENOUS
  Filled 2023-12-07: qty 200

## 2023-12-07 MED ORDER — SENNA 8.6 MG PO TABS
1.0000 | ORAL_TABLET | Freq: Two times a day (BID) | ORAL | Status: DC
  Administered 2023-12-07 – 2023-12-11 (×9): 8.6 mg via ORAL
  Filled 2023-12-07 (×10): qty 1

## 2023-12-07 MED ORDER — HYDROCODONE-ACETAMINOPHEN 5-325 MG PO TABS: 1.0000 | ORAL_TABLET | ORAL | Status: DC | PRN

## 2023-12-07 MED ORDER — ROCURONIUM BROMIDE 10 MG/ML (PF) SYRINGE
PREFILLED_SYRINGE | INTRAVENOUS | Status: AC
  Filled 2023-12-07: qty 10

## 2023-12-07 MED ORDER — THROMBIN 20000 UNITS EX SOLR: CUTANEOUS | Status: DC | PRN

## 2023-12-07 MED ORDER — PROPOFOL 10 MG/ML IV BOLUS
INTRAVENOUS | Status: DC | PRN
  Administered 2023-12-07: 150 mg via INTRAVENOUS

## 2023-12-07 MED ORDER — PHENYLEPHRINE HCL-NACL 20-0.9 MG/250ML-% IV SOLN
INTRAVENOUS | Status: DC | PRN
  Administered 2023-12-07: 20 ug/min via INTRAVENOUS

## 2023-12-07 MED ORDER — GABAPENTIN 300 MG PO CAPS
300.0000 mg | ORAL_CAPSULE | ORAL | Status: AC
  Administered 2023-12-07: 300 mg via ORAL
  Filled 2023-12-07: qty 1

## 2023-12-07 MED ORDER — AMISULPRIDE (ANTIEMETIC) 5 MG/2ML IV SOLN
INTRAVENOUS | Status: AC
  Filled 2023-12-07: qty 4

## 2023-12-07 MED ORDER — ONDANSETRON HCL 4 MG/2ML IJ SOLN
4.0000 mg | Freq: Four times a day (QID) | INTRAMUSCULAR | Status: DC | PRN
  Administered 2023-12-07: 4 mg via INTRAVENOUS
  Filled 2023-12-07: qty 2

## 2023-12-07 MED ORDER — CARVEDILOL 12.5 MG PO TABS
50.0000 mg | ORAL_TABLET | Freq: Once | ORAL | Status: AC
  Administered 2023-12-07: 50 mg via ORAL

## 2023-12-07 MED ORDER — HYDRALAZINE HCL 50 MG PO TABS
50.0000 mg | ORAL_TABLET | Freq: Two times a day (BID) | ORAL | Status: DC
  Administered 2023-12-07 – 2023-12-11 (×7): 50 mg via ORAL
  Filled 2023-12-07 (×8): qty 1

## 2023-12-07 MED ORDER — PHENYLEPHRINE 80 MCG/ML (10ML) SYRINGE FOR IV PUSH (FOR BLOOD PRESSURE SUPPORT)
PREFILLED_SYRINGE | INTRAVENOUS | Status: DC | PRN
  Administered 2023-12-07 (×2): 80 ug via INTRAVENOUS

## 2023-12-07 MED ORDER — THROMBIN 5000 UNITS EX SOLR: OROMUCOSAL | Status: DC | PRN

## 2023-12-07 SURGICAL SUPPLY — 52 items
ALLOGRAFT BONE FIBER KORE 5 (Bone Implant) IMPLANT
BAG COUNTER SPONGE SURGICOUNT (BAG) ×2 IMPLANT
BASKET BONE COLLECTION (BASKET) ×2 IMPLANT
BENZOIN TINCTURE PRP APPL 2/3 (GAUZE/BANDAGES/DRESSINGS) ×2 IMPLANT
BLADE BONE MILL MEDIUM (MISCELLANEOUS) ×2 IMPLANT
BLADE CLIPPER SURG (BLADE) IMPLANT
BUR CARBIDE MATCH 3.0 (BURR) ×2 IMPLANT
CANISTER SUCT 3000ML PPV (MISCELLANEOUS) ×2 IMPLANT
CNTNR URN SCR LID CUP LEK RST (MISCELLANEOUS) ×2 IMPLANT
COVER BACK TABLE 60X90IN (DRAPES) ×2 IMPLANT
DERMABOND ADVANCED .7 DNX12 (GAUZE/BANDAGES/DRESSINGS) ×2 IMPLANT
DRAPE C-ARM 42X72 X-RAY (DRAPES) ×4 IMPLANT
DRAPE C-ARMOR (DRAPES) ×2 IMPLANT
DRAPE LAPAROTOMY 100X72X124 (DRAPES) ×2 IMPLANT
DRAPE SURG 17X23 STRL (DRAPES) ×2 IMPLANT
DRSG OPSITE POSTOP 4X6 (GAUZE/BANDAGES/DRESSINGS) IMPLANT
DURAPREP 26ML APPLICATOR (WOUND CARE) ×2 IMPLANT
ELECT REM PT RETURN 9FT ADLT (ELECTROSURGICAL) ×1 IMPLANT
ELECTRODE REM PT RTRN 9FT ADLT (ELECTROSURGICAL) ×2 IMPLANT
EVACUATOR 1/8 PVC DRAIN (DRAIN) ×2 IMPLANT
GAUZE 4X4 16PLY ~~LOC~~+RFID DBL (SPONGE) IMPLANT
GLOVE BIO SURGEON STRL SZ7 (GLOVE) IMPLANT
GLOVE BIO SURGEON STRL SZ8 (GLOVE) ×4 IMPLANT
GLOVE BIOGEL PI IND STRL 7.0 (GLOVE) IMPLANT
GOWN STRL REUS W/ TWL LRG LVL3 (GOWN DISPOSABLE) IMPLANT
GOWN STRL REUS W/ TWL XL LVL3 (GOWN DISPOSABLE) ×4 IMPLANT
GOWN STRL REUS W/TWL 2XL LVL3 (GOWN DISPOSABLE) IMPLANT
GRAFT BN 5X1XSPNE CVD POST DBM (Bone Implant) IMPLANT
HEMOSTAT POWDER KIT SURGIFOAM (HEMOSTASIS) ×2 IMPLANT
KIT BASIN OR (CUSTOM PROCEDURE TRAY) ×2 IMPLANT
KIT INFUSE SMALL (Orthopedic Implant) IMPLANT
KIT TURNOVER KIT B (KITS) ×2 IMPLANT
MILL BONE PREP (MISCELLANEOUS) ×2 IMPLANT
NDL HYPO 25X1 1.5 SAFETY (NEEDLE) ×2 IMPLANT
NEEDLE HYPO 25X1 1.5 SAFETY (NEEDLE) ×1 IMPLANT
NS IRRIG 1000ML POUR BTL (IV SOLUTION) ×2 IMPLANT
PACK LAMINECTOMY NEURO (CUSTOM PROCEDURE TRAY) ×2 IMPLANT
PAD ARMBOARD 7.5X6 YLW CONV (MISCELLANEOUS) ×6 IMPLANT
ROD LORD LIPPED TI 5.5X40 (Rod) IMPLANT
SCREW CANC SHANK MOD 6.5X45 (Screw) IMPLANT
SCREW POLYAXIAL TULIP (Screw) IMPLANT
SET SCREW SPNE (Screw) IMPLANT
SOLUTION IRRIG SURGIPHOR (IV SOLUTION) ×2 IMPLANT
SPONGE SURGIFOAM ABS GEL 100 (HEMOSTASIS) ×2 IMPLANT
SPONGE T-LAP 4X18 ~~LOC~~+RFID (SPONGE) IMPLANT
STRIP CLOSURE SKIN 1/2X4 (GAUZE/BANDAGES/DRESSINGS) ×2 IMPLANT
SUT VIC AB 0 CT1 18XCR BRD8 (SUTURE) ×2 IMPLANT
SUT VIC AB 2-0 CP2 18 (SUTURE) ×2 IMPLANT
SUT VIC AB 3-0 SH 8-18 (SUTURE) ×4 IMPLANT
TOWEL GREEN STERILE (TOWEL DISPOSABLE) ×2 IMPLANT
TOWEL GREEN STERILE FF (TOWEL DISPOSABLE) ×2 IMPLANT
WATER STERILE IRR 1000ML POUR (IV SOLUTION) ×2 IMPLANT

## 2023-12-07 NOTE — H&P (Signed)
 Subjective: Patient is a 63 y.o. female admitted for post laminectomy spondylolisthesis L4-5. Onset of symptoms was a few months ago, gradually worsening since that time.  The pain is rated severe, and is located at the across the lower back and radiates to legs. The pain is described as aching and occurs all day. The symptoms have been progressive. Symptoms are exacerbated by exercise, standing, and walking for more than a few minutes. MRI or CT showed spondylolisthesis   Past Medical History:  Diagnosis Date   Anxiety    Aortic atherosclerosis (HCC) 04/16/2021   Atypical angina (HCC)    Back pain    related to spinal stenosis and disc problem, radiates down left buttocks to leg., weakness occ.   Chest pain    a. 03/2015 Cath: nl cors; b. 03/2021 Cor CTA: Ca2+ = 0. Nl Cors.   Chronic pain syndrome    Dyspnea    GERD (gastroesophageal reflux disease)    Grade I diastolic dysfunction    Headache    Hyperlipidemia    Hypertension    Lumbar post-laminectomy syndrome    LVH (left ventricular hypertrophy) 12/15/2020   a. 11/2020 Echo: EF 65-70%, no rwma, sev asymm LVH with IVSd 1.9 cm. No LVOT obs @ rest. Gr1 DD. Triv MR.   PONV (postoperative nausea and vomiting)    Pulmonary nodules    a. 03/2021 CT Chest: 3mm pulm nodules in bilat lower lobes. F/u 1 yr.   Right foot drop    Syncope    a. 11/2020 Zio: No significant arrhythmias.   Vaginal foreign object    "Uses Femring "    Past Surgical History:  Procedure Laterality Date   ABDOMINAL HYSTERECTOMY     ANTERIOR CERVICAL DECOMP/DISCECTOMY FUSION N/A 01/03/2022   Procedure: Anterior Cervical Decompression Fusion - Cervical four-Cervical five - Cervical five-Cervical six;  Surgeon: Isadora Mar, MD;  Location: [facility] OR;  Service: Neurosurgery;  Laterality: N/A;   BIOPSY  12/16/2020   Procedure: BIOPSY;  Surgeon: Asencion Blacksmith, MD;  Location: [facility] ENDOSCOPY;  Service: Endoscopy;;   CARDIAC CATHETERIZATION N/A 04/18/2015   Procedure: Left  Heart Cath and Coronary Angiography;  Surgeon: Chapman Commodore, MD;  Location: [facility] INVASIVE CV LAB;  Service: Cardiovascular;  Laterality: N/A;   COLONOSCOPY     COLONOSCOPY W/ BIOPSIES AND POLYPECTOMY  2018   ESOPHAGOGASTRODUODENOSCOPY (EGD) WITH PROPOFOL  N/A 12/16/2020   Procedure: ESOPHAGOGASTRODUODENOSCOPY (EGD) WITH PROPOFOL ;  Surgeon: Asencion Blacksmith, MD;  Location: [facility] ENDOSCOPY;  Service: Endoscopy;  Laterality: N/A;   FOOT SURGERY Bilateral    [facility] "bunion,bone spur, tendon" (1) -6'16, (1)-10'16   HEMATOMA EVACUATION N/A 01/05/2022   Procedure: Cervical Wound Exploration;  Surgeon: Audie Bleacher, MD;  Location: [facility] OR;  Service: Neurosurgery;  Laterality: N/A;   IR EPIDUROGRAPHY  07/21/2018   LUMBAR LAMINECTOMY/DECOMPRESSION MICRODISCECTOMY Bilateral 12/28/2015   Procedure: MICRO LUMBAR DECOMPRESSION L4 - L5 BILATERALLY;  Surgeon: Orvan Blanch, MD;  Location: [facility] ORS;  Service: Orthopedics;  Laterality: Bilateral;   LUMBAR LAMINECTOMY/DECOMPRESSION MICRODISCECTOMY Bilateral 03/04/2018   Procedure: Revision of Microlumbar Decompression Bilateral Lumbar Four-Five;  Surgeon: Orvan Blanch, MD;  Location: [facility] OR;  Service: Orthopedics;  Laterality: Bilateral;  90 mins   SAVORY DILATION N/A 12/16/2020   Procedure: SAVORY DILATION;  Surgeon: Asencion Blacksmith, MD;  Location: [facility] ENDOSCOPY;  Service: Endoscopy;  Laterality: N/A;   SPINAL CORD STIMULATOR INSERTION N/A 09/28/2019   Procedure: THORACIC SPINAL CORD STIMULATOR INSERTION;  Surgeon: Mort Ards, MD;  Location: [facility]  OR;  Service: Orthopedics;  Laterality: N/A;  2.5 hrs   SPINAL CORD STIMULATOR REMOVAL N/A 05/27/2021   Procedure: LUMBAR SPINAL CORD STIMULATOR REMOVAL;  Surgeon: Berta Brittle, MD;  Location: [facility] ORS;  Service: Neurosurgery;  Laterality: N/A;   TUBAL LIGATION     WISDOM TOOTH EXTRACTION     WOUND EXPLORATION N/A 03/04/2018   Procedure: EXPLORATION OF LUMBAR DECOMPRESSION WOUND;  Surgeon: Orvan Blanch, MD;   Location: [facility] OR;  Service: Orthopedics;  Laterality: N/A;    Prior to Admission medications   Medication Sig Start Date End Date Taking? Authorizing Provider  acetaminophen  (TYLENOL ) 500 MG tablet Take 500 mg by mouth every 6 (six) hours as needed for headache.   Yes [provider]  ALPRAZolam  (XANAX ) 0.5 MG tablet Take 1/2 - 1 tablet by mouth at bedtime as needed for anxiety. 10/19/23  Yes [place], [name], MD  amitriptyline  (ELAVIL ) 25 MG tablet Take 1 tablet (25 mg total) by mouth daily. 08/13/23  Yes   amLODipine  (NORVASC ) 5 MG tablet Take 1 tablet (5 mg total) by mouth daily. 06/10/23 06/04/24 Yes Hilty, Aviva Lemmings, MD  atorvastatin  (LIPITOR) 20 MG tablet Take 1 tablet (20 mg total) by mouth daily. 11/10/23  Yes [place], [name], MD  baclofen  (LIORESAL ) 10 MG tablet Take 1 tablet (10 mg total) by mouth 3 (three) times daily as needed. 10/13/23  Yes Rawland Caddy, MD  carvedilol  (COREG ) 25 MG tablet Take 1 tablet (25 mg total) by mouth 2 (two) times daily with a meal. Patient taking differently: Take 50 mg by mouth 2 (two) times daily with a meal. 08/06/22 12/07/23 Yes Clearnce Curia, NP  cholecalciferol  (VITAMIN D3) 25 MCG (1000 UNIT) tablet Take 1,000 Units by mouth daily.   Yes [provider]  diphenhydrAMINE  (BENADRYL ) 25 MG tablet Take 25 mg by mouth daily as needed for itching.   Yes [provider]  DULoxetine  (CYMBALTA ) 30 MG capsule Take 2 capsules (60 mg total) by mouth daily. Patient taking differently: Take 30 mg by mouth daily as needed (pain). 09/30/23  Yes Rawland Caddy, MD  Estradiol  Acetate (FEMRING ) 0.05 MG/24HR RING USE ONE RING VAGINALLY EVERY 3 MONTHS AS DIRECTED 03/16/23  Yes   gabapentin  (NEURONTIN ) 600 MG tablet Take 1 tablet (600 mg total) by mouth 3 (three) times daily. 09/11/23  Yes Rawland Caddy, MD  hydrALAZINE  (APRESOLINE ) 50 MG tablet Take 1 tablet (50 mg total) by mouth 3 (three) times daily. Patient taking differently: Take 50  mg by mouth 2 (two) times daily. 11/12/23 02/11/24 Yes West, Katlyn D, NP  methocarbamol  (ROBAXIN ) 500 MG tablet Take 1 tablet (500 mg total) by mouth 3 (three) times daily as needed for muscle spasm. 08/12/23  Yes   spironolactone  (ALDACTONE ) 50 MG tablet Take 1 tablet (50 mg total) by mouth daily. 12/23/22 12/18/23 Yes Hilty, Aviva Lemmings, MD  terbinafine (LAMISIL) 1 % cream Apply 1 Application topically 2 (two) times daily as needed (rash).   Yes [provider]  traMADol  (ULTRAM ) 50 MG tablet Take 1 tablet (50 mg total) by mouth every 6 (six) hours as needed. 09/30/23  Yes Rawland Caddy, MD  betamethasone  dipropionate 0.05 % cream Apply as directed to skin once a day to itchy areas only avoiding normal skin as needed 12/02/23     fluconazole  (DIFLUCAN ) 200 MG tablet Take 1 tablet by mouth once a week Patient not taking: Reported on 11/30/2023 11/02/23     HYDROcodone -acetaminophen  (NORCO/VICODIN) 5-325  MG tablet Take 1 tablet by mouth every 6 (six) hours as needed for pain Patient not taking: Reported on 11/30/2023 10/06/23     ondansetron  (ZOFRAN ) 4 MG tablet Take 1 tablet (4 mg tablet) 30 - 60 minutes each prep dose Patient not taking: Reported on 11/30/2023 09/01/22   Albertina Hugger, MD  pregabalin  (LYRICA ) 300 MG capsule Take 1 capsule (300 mg total) by mouth 2 (two) times daily. Patient not taking: Reported on 11/30/2023 08/07/23 02/03/24  Camara, Amadou, MD  omeprazole  (PRILOSEC) 40 MG capsule Take 1 capsule (40 mg total) by mouth daily. 04/14/23 09/05/23  [place], [name], MD   Allergies  Allergen Reactions   Cephalosporins Anaphylaxis   Penicillins Anaphylaxis and Hives   Anesthetics, Amide Nausea And Vomiting    Does not know name of it. [facility].    Betadine [Povidone Iodine] Other (See Comments)    "Skin Burn" caused scar on Left buttock   Latex Hives, Itching, Rash and Other (See Comments)   Peach [Prunus Persica] Hives   Claritin [Loratadine]      pruritis    Hydrocodone      Altered mental state, feel spaced out     Social History   Tobacco Use   Smoking status: Never   Smokeless tobacco: Never  Substance Use Topics   Alcohol use: Never    Family History  Problem Relation Age of Onset   Heart attack Mother    Lung cancer Father    Cancer Father    Pancreatic cancer Sister    Breast cancer Sister 49   Multiple myeloma Sister    Breast cancer Sister        diagnosed in her 21's   Heart attack Sister    Throat cancer Brother    Lung cancer Brother    Stomach cancer Cousin    Colon cancer Neg Hx    Colon polyps Neg Hx    Esophageal cancer Neg Hx    Rectal cancer Neg Hx      Review of Systems  Positive ROS: same  All other systems have been reviewed and were otherwise negative with the exception of those mentioned in the HPI and as above.  Objective: Vital signs in last 24 hours: Temp:  [97.6 F (36.4 C)] 97.6 F (36.4 C) (02/10 0838) Pulse Rate:  [86] 86 (02/10 0838) Resp:  [18] 18 (02/10 0838) BP: (167)/(92) 167/92 (02/10 0838) SpO2:  [100 %] 100 % (02/10 0838) Weight:  [76.7 kg] 76.7 kg (02/10 0838)  General Appearance: Alert, cooperative, no distress, appears stated age Head: Normocephalic, without obvious abnormality, atraumatic Eyes: PERRL, conjunctiva/corneas clear, EOM's intact    Neck: Supple, symmetrical, trachea midline Back: Symmetric, no curvature, ROM normal, no CVA tenderness Lungs:  respirations unlabored Heart: Regular rate and rhythm Abdomen: Soft, non-tender Extremities: Extremities normal, atraumatic, no cyanosis or edema Pulses: 2+ and symmetric all extremities Skin: Skin color, texture, turgor normal, no rashes or lesions  NEUROLOGIC:   Mental status: Alert and oriented x4,  no aphasia, good attention span, fund of knowledge, and memory Motor Exam - grossly normal Sensory Exam - grossly normal Reflexes: 1+ Coordination - grossly normal Gait - grossly normal Balance -  grossly normal Cranial Nerves: I: smell Not tested  II: visual acuity  OS: nl    OD: nl  II: visual fields Full to confrontation  II: pupils Equal, round, reactive to light  III,VII: ptosis None  III,IV,VI:  extraocular muscles  Full ROM  V: mastication Normal  V: facial light touch sensation  Normal  V,VII: corneal reflex  Present  VII: facial muscle function - upper  Normal  VII: facial muscle function - lower Normal  VIII: hearing Not tested  IX: soft palate elevation  Normal  IX,X: gag reflex Present  XI: trapezius strength  5/5  XI: sternocleidomastoid strength 5/5  XI: neck flexion strength  5/5  XII: tongue strength  Normal    Data Review Lab Results  Component Value Date   WBC 5.7 12/01/2023   HGB 13.4 12/01/2023   HCT 41.8 12/01/2023   MCV 93.5 12/01/2023   PLT 303 12/01/2023   Lab Results  Component Value Date   NA 139 12/01/2023   K 4.0 12/01/2023   CL 103 12/01/2023   CO2 29 12/01/2023   BUN 10 12/01/2023   CREATININE 1.24 (H) 12/01/2023   GLUCOSE 85 12/01/2023   Lab Results  Component Value Date   INR 1.0 12/01/2023    Assessment/Plan:  Estimated body mass index is 27.28 kg/m as calculated from the following:   Height as of this encounter: 5\' 6"  (1.676 m).   Weight as of this encounter: 76.7 kg. Patient admitted for PLIF L4-5. Patient has failed a reasonable attempt at conservative therapy.  I explained the condition and procedure to the patient and answered any questions.  Patient wishes to proceed with procedure as planned. Understands risks/ benefits and typical outcomes of procedure.   Isadora Mar 12/07/2023 10:08 AM

## 2023-12-07 NOTE — Anesthesia Procedure Notes (Signed)
 Procedure Name: Intubation Date/Time: 12/07/2023 10:37 AM  Performed by: Luwanna Sam, CRNAPre-anesthesia Checklist: Patient identified, Emergency Drugs available, Suction available, Patient being monitored and Timeout performed Patient Re-evaluated:Patient Re-evaluated prior to induction Oxygen Delivery Method: Circle system utilized Preoxygenation: Pre-oxygenation with 100% oxygen Induction Type: IV induction Ventilation: Mask ventilation without difficulty Laryngoscope Size: Glidescope and 3 Grade View: Grade I Tube type: Oral Tube size: 7.0 mm Number of attempts: 1 Placement Confirmation: ETT inserted through vocal cords under direct vision, positive ETCO2 and breath sounds checked- equal and bilateral Secured at: 22 cm Tube secured with: Tape Dental Injury: Teeth and Oropharynx as per pre-operative assessment

## 2023-12-07 NOTE — Transfer of Care (Signed)
 Immediate Anesthesia Transfer of Care Note  Patient: [name]  Procedure(s) Performed: LUMBAR FOUR-FIVE POSTERIOR LATERAL FUSION (Back)  Patient Location: PACU  Anesthesia Type:General  Level of Consciousness: drowsy and patient cooperative  Airway & Oxygen Therapy: Patient Spontanous Breathing and Patient connected to face mask oxygen  Post-op Assessment: Report given to RN and Post -op Vital signs reviewed and stable  Post vital signs: Reviewed and stable  Last Vitals:  Vitals Value Taken Time  BP 139/82 12/07/23 1215  Temp 36.1 C 12/07/23 1213  Pulse 69 12/07/23 1223  Resp 7 12/07/23 1223  SpO2 100 % 12/07/23 1223  Vitals shown include unfiled device data.  Last Pain:  Vitals:   12/07/23 1213  TempSrc:   PainSc: 10-Worst pain ever      Patients Stated Pain Goal: 4 (12/07/23 0908)  Complications: No notable events documented.

## 2023-12-07 NOTE — Op Note (Signed)
 12/07/2023  12:00 PM  PATIENT:  [name]  63 y.o. female  PRE-OPERATIVE DIAGNOSIS: Postlaminectomy spondylolisthesis L4-5 with back pain and radicular pain  POST-OPERATIVE DIAGNOSIS:  same  PROCEDURE:    1. Posterior fixation L4-5 using ATEC cortical pedicle screws.  2. Intertransverse arthrodesis L4-5 using morcellized allograft.  SURGEON:  Waymond Hailey, MD  ASSISTANTS: Jackee Marus, FNP  ANESTHESIA:  General  EBL: 25 ml  Total I/O In: 1000 [I.V.:1000] Out: 25 [Blood:25]  BLOOD ADMINISTERED:none  DRAINS: none   INDICATION FOR PROCEDURE: This patient presented with back and right leg pain. Imaging revealed postlaminectomy spondylolisthesis L4-5. The patient tried a reasonable attempt at conservative medical measures without relief. I recommended decompression and instrumented fusion to address the stenosis as well as the segmental  instability.  Patient understood the risks, benefits, and alternatives and potential outcomes and wished to proceed.  PROCEDURE DETAILS:  The patient was brought to the operating room. After induction of generalized endotracheal anesthesia the patient was rolled into the prone position on chest rolls and all pressure points were padded. The patient's lumbar region was cleaned and then prepped with DuraPrep and draped in the usual sterile fashion. Anesthesia was injected and then a dorsal midline incision was made and carried down to the lumbosacral fascia. The fascia was opened and the paraspinous musculature was taken down in a subperiosteal fashion to expose L4-5. A self-retaining retractor was placed. Intraoperative fluoroscopy confirmed my level, and I started with placement of the L4 and L5 cortical pedicle screws. The pedicle screw entry zones were identified utilizing surface landmarks and  AP and lateral fluoroscopy. I scored the cortex with the high-speed drill and then used the hand drill to drill an upward and outward direction  into the pedicle. I then tapped line to line. I then placed a 6.5 x 45 mm cortical pedicle screw into the pedicles of L4 and L5 bilaterally.  My nurse practitioner assisted with the placement of the pedicle screws      We then decorticated the transverse processes and laid a mixture of morcellized allograft out over these to perform intertransverse arthrodesis at L4-5. We then placed lordotic rods into the multiaxial screw heads of the pedicle screws and locked these in position with the locking caps and anti-torque device. We then checked our construct with AP and lateral fluoroscopy. Irrigated with copious amounts of saline solution. we closed the muscle and the fascia with 0 Vicryl. Closed the subcutaneous tissues with 2-0 Vicryl and subcuticular tissues with 3-0 Vicryl. The skin was closed with benzoin and Steri-Strips. Dressing was then applied, the patient was awakened from general anesthesia and transported to the recovery room in stable condition. At the end of the procedure all sponge, needle and instrument counts were correct.   PLAN OF CARE: admit to inpatient  PATIENT DISPOSITION:  PACU - hemodynamically stable.   Delay start of Pharmacological VTE agent (>24hrs) due to surgical blood loss or risk of bleeding:  yes

## 2023-12-08 ENCOUNTER — Telehealth: Payer: Self-pay

## 2023-12-08 MED ORDER — ACETAMINOPHEN 500 MG PO TABS
1000.0000 mg | ORAL_TABLET | Freq: Four times a day (QID) | ORAL | Status: AC
  Administered 2023-12-08 – 2023-12-09 (×4): 1000 mg via ORAL
  Filled 2023-12-08 (×4): qty 2

## 2023-12-08 MED ORDER — METHOCARBAMOL 500 MG PO TABS
500.0000 mg | ORAL_TABLET | Freq: Four times a day (QID) | ORAL | Status: DC | PRN
  Administered 2023-12-08 – 2023-12-11 (×5): 500 mg via ORAL
  Filled 2023-12-08 (×5): qty 1

## 2023-12-08 NOTE — Evaluation (Signed)
Occupational Therapy Evaluation Patient Details Name: [name] MRN: [id number] DOB: [birth year] Today's Date: 12/08/2023   History of Present Illness   History of Present Illness: [name]. Lobb is a 63 yo female presenting with severe lower back pain that radiates to legs. MRI revealed postlaminectomy spondylolisthesis at L4-5. 12/07/2023  Posterior fixation L4-5 and Intertransverse arthrodesis L4-5. PMHX includes HLD, HTN, GERD, LVH, syncope, R foot drop     Clinical Impressions [name] was evaluated s/p the above admission list. She lives with her husband who works but assists with ADLs as needed at baseline, pt wears a R AFO and uses AD to mobilize. Upon evaluation the pt was limited by surgical pain, spinal precautions, unsteady gait, BLE weakness, R foot drop and poor activity tolerance. Overall she needed CGA for bed mobility, transfers and short mobility with RW. Due to the deficits listed below the pt also needs up to mod A for LB ADLs and set up A for UB ADLs in sitting. Pt will benefit from continued acute OT services and HHOT at discharge.        If plan is discharge home, recommend the following:   A little help with walking and/or transfers;A lot of help with bathing/dressing/bathroom;Assistance with cooking/housework;Assist for transportation;Help with stairs or ramp for entrance     Functional Status Assessment   Patient has had a recent decline in their functional status and demonstrates the ability to make significant improvements in function in a reasonable and predictable amount of time.     Equipment Recommendations   None recommended by OT      Precautions/Restrictions   Precautions Precautions: Fall;Back Precaution Booklet Issued: Yes (comment) Recall of Precautions/Restrictions: Intact Required Braces or Orthoses: Spinal Brace (R AFO) Spinal Brace: Lumbar corset Restrictions Weight Bearing Restrictions Per Provider Order: No      Mobility Bed Mobility Overal bed mobility: Needs Assistance Bed Mobility: Rolling, Sidelying to Sit Rolling: Contact guard assist Sidelying to sit: Contact guard assist            Transfers Overall transfer level: Needs assistance Equipment used: Rolling walker (2 wheels) Transfers: Sit to/from Stand Sit to Stand: Contact guard assist           General transfer comment: able to rise from bed in lowest position      Balance Overall balance assessment: Needs assistance Sitting-balance support: Feet supported Sitting balance-Leahy Scale: Good     Standing balance support: Bilateral upper extremity supported, Reliant on assistive device for balance, During functional activity Standing balance-Leahy Scale: Poor                             ADL either performed or assessed with clinical judgement   ADL Overall ADL's : Needs assistance/impaired Eating/Feeding: Independent   Grooming: Set up;Sitting   Upper Body Bathing: Set up;Sitting   Lower Body Bathing: Moderate assistance;Sit to/from stand   Upper Body Dressing : Set up;Sitting   Lower Body Dressing: Moderate assistance;Sit to/from stand   Toilet Transfer: Contact guard assist;Ambulation;Rolling walker (2 wheels) Toilet Transfer Details (indicate cue type and reason): short ambulation only, benefits from AFO donned Toileting- Clothing Manipulation and Hygiene: Moderate assistance;Sit to/from stand Toileting - Clothing Manipulation Details (indicate cue type and reason): needs assis for rear peri care     Functional mobility during ADLs: Contact guard assist;Rolling walker (2 wheels);Cueing for safety General ADL Comments: limited to short distance only with sitting rest breaks due  to BLE weakness     Vision Baseline Vision/History: 0 No visual deficits Vision Assessment?: No apparent visual deficits     Perception Perception: Not tested       Praxis Praxis: Not tested       Pertinent  Vitals/Pain Pain Assessment Pain Assessment: Faces Faces Pain Scale: Hurts little more Pain Location: back Pain Descriptors / Indicators: Burning, Discomfort Pain Intervention(s): Monitored during session, Limited activity within patient's tolerance     Extremity/Trunk Assessment Upper Extremity Assessment Upper Extremity Assessment: Overall WFL for tasks assessed   Lower Extremity Assessment Lower Extremity Assessment: Defer to PT evaluation   Cervical / Trunk Assessment Cervical / Trunk Assessment: Back Surgery   Communication Communication Communication: No apparent difficulties   Cognition Arousal: Alert Behavior During Therapy: WFL for tasks assessed/performed Cognition: No apparent impairments                               Following commands: Intact                  Home Living Family/patient expects to be discharged to:: Private residence Living Arrangements: Spouse/significant other Available Help at Discharge: Family;Available 24 hours/day Type of Home: House Home Access: Stairs to enter   Entrance Stairs-Rails: Right Home Layout: Two level;Able to live on main level with bedroom/bathroom     Bathroom Shower/Tub: Arts development officer Toilet: Handicapped height     Home Equipment: Agricultural consultant (2 wheels);Rollator (4 wheels);Cane - quad;Cane - single point;BSC/3in1;Shower seat;Wheelchair - manual   Additional Comments: pt's sister is coming in from [place] to stay with pt 24/7 for at least 2 weeks, pt's husband works      Prior Functioning/Environment Prior Level of Function : Needs assist             Mobility Comments: has been using AD (SPC, rollator or RW)) ADLs Comments: husband assists as needed for ADLs    OT Problem List: Decreased strength;Decreased range of motion;Decreased activity tolerance;Impaired balance (sitting and/or standing);Decreased safety awareness;Decreased knowledge of use of DME or AE;Decreased  knowledge of precautions;Pain   OT Treatment/Interventions: Self-care/ADL training;Therapeutic exercise;DME and/or AE instruction;Therapeutic activities;Balance training;Patient/family education      OT Goals(Current goals can be found in the care plan section)   Acute Rehab OT Goals Patient Stated Goal: home friday OT Goal Formulation: With patient Time For Goal Achievement: 12/22/23 Potential to Achieve Goals: Good ADL Goals Pt Will Perform Grooming: with contact guard assist;standing Pt Will Perform Lower Body Dressing: with min assist;sit to/from stand Pt Will Transfer to Toilet: with supervision;ambulating;regular height toilet Pt Will Perform Toileting - Clothing Manipulation and hygiene: with modified independence;sitting/lateral leans   OT Frequency:  Min 1X/week       AM-PAC OT "6 Clicks" Daily Activity     Outcome Measure Help from another person eating meals?: None Help from another person taking care of personal grooming?: A Little Help from another person toileting, which includes using toliet, bedpan, or urinal?: A Little Help from another person bathing (including washing, rinsing, drying)?: A Lot Help from another person to put on and taking off regular upper body clothing?: A Little Help from another person to put on and taking off regular lower body clothing?: A Lot 6 Click Score: 17   End of Session Equipment Utilized During Treatment: Rolling walker (2 wheels);Back brace Nurse Communication: Mobility status  Activity Tolerance: Patient tolerated treatment well Patient left: in  chair;with call bell/phone within reach  OT Visit Diagnosis: Unsteadiness on feet (R26.81);Other abnormalities of gait and mobility (R26.89);Muscle weakness (generalized) (M62.81);History of falling (Z91.81);Pain                Time: 3086-5784 OT Time Calculation (min): 23 min Charges:  OT General Charges $OT Visit: 1 Visit OT Evaluation $OT Eval Moderate Complexity: 1 Mod OT  Treatments $Therapeutic Activity: 8-22 mins  Derenda Mis, OTR/L Acute Rehabilitation Services Office [phone] Secure Chat Communication Preferred   Donia Pounds 12/08/2023, 9:33 AM

## 2023-12-08 NOTE — Anesthesia Postprocedure Evaluation (Signed)
Anesthesia Post Note  Patient: [name]  Procedure(s) Performed: LUMBAR FOUR-FIVE POSTERIOR LATERAL FUSION (Back)     Patient location during evaluation: PACU Anesthesia Type: General Level of consciousness: awake and alert Pain management: pain level controlled Vital Signs Assessment: post-procedure vital signs reviewed and stable Respiratory status: spontaneous breathing, nonlabored ventilation, respiratory function stable and patient connected to nasal cannula oxygen Cardiovascular status: blood pressure returned to baseline and stable Postop Assessment: no apparent nausea or vomiting Anesthetic complications: no   No notable events documented.  Last Vitals:  Vitals:   12/08/23 0328 12/08/23 0831  BP: (!) 147/84 (!) 159/77  Pulse: 72 77  Resp: 18 16  Temp: 36.7 C 36.5 C  SpO2: 99% 96%    Last Pain:  Vitals:   12/08/23 0831  TempSrc: Oral  PainSc:                  Kennieth Rad

## 2023-12-08 NOTE — Telephone Encounter (Signed)
Called patient advised of below they verbalized understanding.

## 2023-12-08 NOTE — Evaluation (Signed)
Physical Therapy Evaluation  Patient Details Name: [name] MRN: [id number] DOB: 15-Sep-1961 Today's Date: 12/08/2023  History of Present Illness  Pt is a 63 y/o female who presents s/p L4-5 PLIF on 12/07/2023. PMH significant for HTN, LVH, syncope, R foot drop.  Clinical Impression  Pt admitted with above diagnosis. At the time of PT eval, pt was able to demonstrate transfers and ambulation with gross CGA to min assist and RW for support. Pt was educated on precautions, brace application/wearing schedule, appropriate activity progression, and car transfer. Pt currently with functional limitations due to the deficits listed below (see PT Problem List). Pt will benefit from skilled PT to increase their independence and safety with mobility to allow discharge to the venue listed below.          If plan is discharge home, recommend the following: A little help with bathing/dressing/bathroom;Assistance with cooking/housework;Help with stairs or ramp for entrance;Assist for transportation;A little help with walking and/or transfers   Can travel by private vehicle        Equipment Recommendations None recommended by PT  Recommendations for Other Services       Functional Status Assessment Patient has had a recent decline in their functional status and demonstrates the ability to make significant improvements in function in a reasonable and predictable amount of time.     Precautions / Restrictions Precautions Precautions: Fall;Back Precaution Booklet Issued: Yes (comment) Recall of Precautions/Restrictions: Intact Precaution/Restrictions Comments: Reviewed handout and pt was cued for precautions during functional mobility. Required Braces or Orthoses: Spinal Brace (R AFO) Spinal Brace: Lumbar corset;Applied in sitting position Restrictions Weight Bearing Restrictions Per Provider Order: No      Mobility  Bed Mobility Overal bed mobility: Needs Assistance Bed  Mobility: Rolling, Sit to Sidelying Rolling: Contact guard assist       Sit to sidelying: Min assist General bed mobility comments: Assist required to elevate LE's up into bed at end of session.    Transfers Overall transfer level: Needs assistance Equipment used: Rolling walker (2 wheels) Transfers: Sit to/from Stand Sit to Stand: Contact guard assist           General transfer comment: VC's for wide BOS and hand placement on seated surface for safety. No assist to power up to full stand.    Ambulation/Gait Ambulation/Gait assistance: Contact guard assist Gait Distance (Feet): 150 Feet Assistive device: Rolling walker (2 wheels) Gait Pattern/deviations: Step-through pattern, Decreased stride length, Trunk flexed Gait velocity: Decreased Gait velocity interpretation: <1.31 ft/sec, indicative of household ambulator   General Gait Details: VC's for improved posture, closer walker proximity, and forward gaze. Chair follow for safety as pt reports knee buckle earlier.  Stairs            Wheelchair Mobility     Tilt Bed    Modified Rankin (Stroke Patients Only)       Balance Overall balance assessment: Needs assistance Sitting-balance support: Feet supported Sitting balance-Leahy Scale: Good     Standing balance support: Bilateral upper extremity supported, Reliant on assistive device for balance, During functional activity Standing balance-Leahy Scale: Poor                               Pertinent Vitals/Pain Pain Assessment Pain Assessment: Faces Faces Pain Scale: Hurts little more Pain Location: back Pain Descriptors / Indicators: Burning, Discomfort Pain Intervention(s): Limited activity within patient's tolerance, Monitored during session, Repositioned    Home  Living Family/patient expects to be discharged to:: Private residence Living Arrangements: Spouse/significant other Available Help at Discharge: Family;Available 24  hours/day Type of Home: House Home Access: Stairs to enter Entrance Stairs-Rails: Right Entrance Stairs-Number of Steps: 4   Home Layout: Two level;Able to live on main level with bedroom/bathroom Home Equipment: Rolling Walker (2 wheels);Rollator (4 wheels);Cane - quad;Cane - single point;BSC/3in1;Shower seat;Wheelchair - manual Additional Comments: pt's sister is coming in from [place] to stay with pt 24/7 for at least 2 weeks, pt's husband works    Prior Function Prior Level of Function : Needs assist             Mobility Comments: has been using AD (SPC, rollator or RW)) ADLs Comments: husband assists as needed for ADLs     Extremity/Trunk Assessment   Upper Extremity Assessment Upper Extremity Assessment: Defer to OT evaluation    Lower Extremity Assessment Lower Extremity Assessment: RLE deficits/detail RLE Deficits / Details: Baseline foot drop with AFO present in room    Cervical / Trunk Assessment Cervical / Trunk Assessment: Back Surgery  Communication   Communication Communication: No apparent difficulties    Cognition Arousal: Alert Behavior During Therapy: WFL for tasks assessed/performed                             Following commands: Intact       Cueing Cueing Techniques: Verbal cues, Gestural cues     General Comments General comments (skin integrity, edema, etc.): VSS on RA    Exercises     Assessment/Plan    PT Assessment Patient needs continued PT services  PT Problem List Decreased strength;Decreased activity tolerance;Decreased balance;Decreased mobility;Decreased knowledge of use of DME;Decreased safety awareness;Decreased knowledge of precautions;Pain       PT Treatment Interventions DME instruction;Gait training;Functional mobility training;Stair training;Therapeutic activities;Therapeutic exercise;Balance training;Patient/family education    PT Goals (Current goals can be found in the Care Plan section)  Acute Rehab PT  Goals Patient Stated Goal: Home tomorrow; be able to do aquatic therapy. PT Goal Formulation: With patient Time For Goal Achievement: 12/15/23 Potential to Achieve Goals: Good    Frequency Min 5X/week     Co-evaluation               AM-PAC PT "6 Clicks" Mobility  Outcome Measure Help needed turning from your back to your side while in a flat bed without using bedrails?: A Little Help needed moving from lying on your back to sitting on the side of a flat bed without using bedrails?: A Little Help needed moving to and from a bed to a chair (including a wheelchair)?: A Little Help needed standing up from a chair using your arms (e.g., wheelchair or bedside chair)?: A Little Help needed to walk in hospital room?: A Little Help needed climbing 3-5 steps with a railing? : A Little 6 Click Score: 18    End of Session Equipment Utilized During Treatment: Gait belt;Back brace Activity Tolerance: Patient tolerated treatment well Patient left: in bed;with call bell/phone within reach;with family/visitor present Nurse Communication: Mobility status PT Visit Diagnosis: Unsteadiness on feet (R26.81);Pain Pain - part of body:  (back)    Time: 1610-9604 PT Time Calculation (min) (ACUTE ONLY): 41 min   Charges:   PT Evaluation $PT Eval Moderate Complexity: 1 Mod PT Treatments $Gait Training: 23-37 mins PT General Charges $$ ACUTE PT VISIT: 1 Visit         Conni Slipper,  PT, DPT Acute Rehabilitation Services Secure Chat Preferred Office: [phone]   Marylynn Pearson 12/08/2023, 10:53 AM

## 2023-12-08 NOTE — Telephone Encounter (Signed)
-----   Message from [facility] sent at 12/07/2023 11:04 PM EST ----- Please let Mr. Walmer know that her ABIs show normal blood flow in her legs.  Good results!  Follow-up with Dr. Rennis Golden on 3/3 as planned.

## 2023-12-08 NOTE — Progress Notes (Signed)
Subjective: Patient reports significant back pain without leg pain or numbness tingling or weakness  Objective: Vital signs in last 24 hours: Temp:  [97 F (36.1 C)-98.8 F (37.1 C)] 98 F (36.7 C) (02/11 0328) Pulse Rate:  [63-92] 72 (02/11 0328) Resp:  [7-20] 18 (02/11 0328) BP: (124-167)/(67-92) 147/84 (02/11 0328) SpO2:  [93 %-100 %] 99 % (02/11 0328) Weight:  [76.7 kg] 76.7 kg (02/10 0838)  Intake/Output from previous day: 02/10 0701 - 02/11 0700 In: 1880 [P.O.:480; I.V.:1400] Out: 25 [Blood:25] Intake/Output this shift: No intake/output data recorded.  Neurologic: Grossly normal to in bed exam except for chronic foot drop  Lab Results: Lab Results  Component Value Date   WBC 5.7 12/01/2023   HGB 13.4 12/01/2023   HCT 41.8 12/01/2023   MCV 93.5 12/01/2023   PLT 303 12/01/2023   Lab Results  Component Value Date   INR 1.0 12/01/2023   BMET Lab Results  Component Value Date   NA 139 12/01/2023   K 4.0 12/01/2023   CL 103 12/01/2023   CO2 29 12/01/2023   GLUCOSE 85 12/01/2023   BUN 10 12/01/2023   CREATININE 1.24 (H) 12/01/2023   CALCIUM 9.3 12/01/2023    Studies/Results: DG Lumbar Spine 2-3 Views Result Date: 12/07/2023 CLINICAL DATA:  Elective surgery. EXAM: LUMBAR SPINE - 2-3 VIEW COMPARISON:  None Available. FINDINGS: Two fluoroscopic spot views of the lumbar spine submitted from the operating room. Pedicle screws at L4 and L5. Fluoroscopy time 35 seconds. Dose 20.91 mGy. IMPRESSION: Procedural fluoroscopy during lumbar surgery. Electronically Signed   By: Narda Rutherford M.D.   On: 12/07/2023 12:07   DG C-Arm 1-60 Min-No Report Result Date: 12/07/2023 Fluoroscopy was utilized by the requesting physician.  No radiographic interpretation.   DG C-Arm 1-60 Min-No Report Result Date: 12/07/2023 Fluoroscopy was utilized by the requesting physician.  No radiographic interpretation.    Assessment/Plan: Continue pain control.  Mobilize with therapy.  She  now tells me she cannot leave the hospital until Friday because she has no help at home.  Therefore, we will transfer from [facility]  Estimated body mass index is 27.28 kg/m as calculated from the following:   Height as of this encounter: 5\' 6"  (1.676 m).   Weight as of this encounter: 76.7 kg.    LOS: 0 days    Tia Alert 12/08/2023, 7:43 AM

## 2023-12-08 NOTE — Care Management Obs Status (Signed)
MEDICARE OBSERVATION STATUS NOTIFICATION   Patient Details  Name: [name] MRN: [id number] Date of Birth: 1961/07/05   Medicare Observation Status Notification Given:  Yes    Kermit Balo, RN 12/08/2023, 9:59 AM

## 2023-12-09 ENCOUNTER — Encounter (HOSPITAL_COMMUNITY): Payer: Self-pay | Admitting: Neurological Surgery

## 2023-12-09 DIAGNOSIS — M4316 Spondylolisthesis, lumbar region: Secondary | ICD-10-CM | POA: Diagnosis not present

## 2023-12-09 MED ORDER — PNEUMOCOCCAL 20-VAL CONJ VACC 0.5 ML IM SUSY
0.5000 mL | PREFILLED_SYRINGE | INTRAMUSCULAR | Status: DC
  Filled 2023-12-09: qty 0.5

## 2023-12-09 NOTE — Progress Notes (Signed)
Subjective: Patient reports back pain is improving, no leg pain  Objective: Vital signs in last 24 hours: Temp:  [97.5 F (36.4 C)-98 F (36.7 C)] 97.5 F (36.4 C) (02/12 0841) Pulse Rate:  [69-102] 71 (02/12 0841) Resp:  [16-18] 18 (02/12 0841) BP: (100-151)/(72-94) 117/72 (02/12 0841) SpO2:  [86 %-100 %] 86 % (02/12 0841)  Intake/Output from previous day: 02/11 0701 - 02/12 0700 In: 480 [P.O.:480] Out: -  Intake/Output this shift: No intake/output data recorded.  Neurologic: Grossly normal to in bed exam except old foot drop  Lab Results: Lab Results  Component Value Date   WBC 5.7 12/01/2023   HGB 13.4 12/01/2023   HCT 41.8 12/01/2023   MCV 93.5 12/01/2023   PLT 303 12/01/2023   Lab Results  Component Value Date   INR 1.0 12/01/2023   BMET Lab Results  Component Value Date   NA 139 12/01/2023   K 4.0 12/01/2023   CL 103 12/01/2023   CO2 29 12/01/2023   GLUCOSE 85 12/01/2023   BUN 10 12/01/2023   CREATININE 1.24 (H) 12/01/2023   CALCIUM 9.3 12/01/2023    Studies/Results: DG Lumbar Spine 2-3 Views Result Date: 12/07/2023 CLINICAL DATA:  Elective surgery. EXAM: LUMBAR SPINE - 2-3 VIEW COMPARISON:  None Available. FINDINGS: Two fluoroscopic spot views of the lumbar spine submitted from the operating room. Pedicle screws at L4 and L5. Fluoroscopy time 35 seconds. Dose 20.91 mGy. IMPRESSION: Procedural fluoroscopy during lumbar surgery. Electronically Signed   By: Narda Rutherford M.D.   On: 12/07/2023 12:07   DG C-Arm 1-60 Min-No Report Result Date: 12/07/2023 Fluoroscopy was utilized by the requesting physician.  No radiographic interpretation.   DG C-Arm 1-60 Min-No Report Result Date: 12/07/2023 Fluoroscopy was utilized by the requesting physician.  No radiographic interpretation.    Assessment/Plan: Continue pain control, still requiring prn IV pain meds PT/OT  Estimated body mass index is 27.28 kg/m as calculated from the following:   Height as of  this encounter: 5\' 6"  (1.676 m).   Weight as of this encounter: 76.7 kg.    LOS: 0 days    Tia Alert 12/09/2023, 11:41 AM

## 2023-12-09 NOTE — TOC Initial Note (Signed)
Transition of Care [facility]) - Initial/Assessment Note    Patient Details  Name: [name] MRN: [id number] Date of Birth: September 25, 1961  Transition of Care [facility]) CM/SW Contact:    Kermit Balo, RN Phone Number: 12/09/2023, 11:18 AM  Clinical Narrative:                  Pt is s/p surgery. Home health arranged with [facility]. Information on the AVS. [facility] will contact her for the first home visit. Pt has needed DME at home.  Per MD pt is medically ready to d/c home. She is staying as she doesn't feel she has the support at home she needs. Her sister is arriving Friday. She plans to d/c home then. TOC following.  Expected Discharge Plan: Home w Home Health Services Barriers to Discharge: Continued Medical Work up   Patient Goals and CMS Choice   CMS Medicare.gov Compare Post Acute Care list provided to:: Patient Choice offered to / list presented to : Patient      Expected Discharge Plan and Services   Discharge Planning Services: CM Consult Post Acute Care Choice: Home Health Living arrangements for the past 2 months: Single Family Home                           HH Arranged: PT, OT HH Agency: [facility] Date [facility] Agency Contacted: 12/09/23   Representative spoke with at [facility] Agency: Amy  Prior Living Arrangements/Services Living arrangements for the past 2 months: Single Family Home Lives with:: Spouse Patient language and need for interpreter reviewed:: Yes Do you feel safe going back to the place where you live?: Yes        Care giver support system in place?: Yes (comment) (Sister arriving Friday)   Criminal Activity/Legal Involvement Pertinent to Current Situation/Hospitalization: No - Comment as needed  Activities of Daily Living      Permission Sought/Granted                  Emotional Assessment Appearance:: Appears stated age Attitude/Demeanor/Rapport: Engaged Affect (typically observed): Accepting Orientation: : Oriented  to Self, Oriented to Place, Oriented to  Time, Oriented to Situation   Psych Involvement: No (comment)  Admission diagnosis:  S/P lumbar fusion [Z98.1] Patient Active Problem List   Diagnosis Date Noted   S/P lumbar fusion 12/07/2023   Pain in thumb joint with movement of right hand 08/12/2023   Localized primary osteoarthritis of carpometacarpal joint of right thumb 07/29/2023   Right foot drop 07/29/2023   Frequent falls 04/14/2023   Gastroesophageal reflux disease 04/14/2023   Right upper extremity numbness 09/29/2022   Stage 3a chronic kidney disease (HCC) 09/29/2022   Cervical spondylosis with radiculopathy 01/05/2022   S/P cervical spinal fusion 01/03/2022   Cervical disc disorder with radiculopathy of cervical region 11/27/2021   Uncontrolled pain 05/27/2021   Syncope 05/13/2021   Depression with anxiety 05/13/2021   Chronic diastolic CHF (congestive heart failure) (HCC) 05/13/2021   Chronic back pain 05/13/2021   Chest tightness 05/13/2021   Lower abdominal pain 05/13/2021   Syncope and collapse 01/01/2021   Abnormal echocardiogram 01/01/2021   BPPV (benign paroxysmal positional vertigo), left 12/26/2020   Odynophagia    Dysphagia 12/15/2020   Postural dizziness with presyncope 12/14/2020   Lumbar post-laminectomy syndrome    Chronic pain 09/28/2019   Lumbar spinal stenosis    Radicular pain    Gait disorder    Difficulty in  urination 07/13/2018   Back pain 07/12/2018   Hyperlipidemia 04/16/2018   GAD (generalized anxiety disorder) 04/16/2018   AKI (acute kidney injury) (HCC)    Benign essential HTN    Major depression, recurrent (HCC)    Lumbar radiculopathy 03/09/2018   Paraparesis (HCC) 03/09/2018   Essential hypertension    Chronic nonintractable headache    Leukocytosis    Acute blood loss anemia    Postoperative pain    Generalized weakness    Spinal stenosis at L4-L5 level 03/04/2018   Spinal stenosis of lumbar region 12/28/2015   Chest pain  04/16/2015   PCP:  [place], [name], MD Pharmacy:   [facility] [address] [place] [address] Phone: [phone] Fax: [phone]     Social Drivers of Health (SDOH) Social History: SDOH Screenings   Food Insecurity: No Food Insecurity (12/08/2023)  Housing: Unknown (12/08/2023)  Transportation Needs: No Transportation Needs (12/08/2023)  Utilities: Not At Risk (12/08/2023)  Alcohol Screen: Low Risk  (10/02/2023)  Depression (PHQ2-9): Low Risk  (10/14/2023)  Financial Resource Strain: Low Risk  (10/02/2023)  Physical Activity: Insufficiently Active (10/02/2023)  Social Connections: Socially Integrated (10/02/2023)  Stress: No Stress Concern Present (10/02/2023)  Tobacco Use: Low Risk  (12/07/2023)  Health Literacy: Adequate Health Literacy (10/02/2023)   SDOH Interventions: Food Insecurity Interventions: Intervention Not Indicated Housing Interventions: Intervention Not Indicated   Readmission Risk Interventions     No data to display

## 2023-12-09 NOTE — Progress Notes (Signed)
Patient arrived in the unit via wheelchair accompanied by Nursing Tech. Transferred to bed, patient complains of tenderness and pain of the surgical site.

## 2023-12-09 NOTE — Progress Notes (Signed)
Physical Therapy Treatment Patient Details Name: [name] MRN: [id number] DOB: 01/24/1961 Today's Date: 12/09/2023   History of Present Illness Pt is a 63 y/o female who presents s/p L4-5 PLIF on 12/07/2023. PMH significant for HTN, LVH, syncope, R foot drop.    PT Comments  Pt resting in bed on arrival and agreeable to session. Pt with slow progress this session secondary to fatigue and dizziness (BP stable with all positional changes) suspect due to IV medication prior to session. Pt demonstrating transfers and gait with grossly CGA to min A with RW for support, with gait distance limited this session and pt requiring seated rest after initial ~32'. Continued education on precautions, brace application/wearing schedule, appropriate activity progression, and car transfer, with pt verbalizing understanding.  Plan to initiate stair training in next session. Pt continues to benefit from skilled PT services to progress toward functional mobility goals.      If plan is discharge home, recommend the following: A little help with bathing/dressing/bathroom;Assistance with cooking/housework;Help with stairs or ramp for entrance;Assist for transportation;A little help with walking and/or transfers   Can travel by private vehicle        Equipment Recommendations  None recommended by PT    Recommendations for Other Services       Precautions / Restrictions Precautions Precautions: Fall;Back Recall of Precautions/Restrictions: Impaired (light cues to recall no lifting) Precaution/Restrictions Comments: Reviewed handout and pt was cued for precautions during functional mobility. Required Braces or Orthoses: Spinal Brace (R AFO) Spinal Brace: Lumbar corset;Applied in sitting position Restrictions Weight Bearing Restrictions Per Provider Order: No     Mobility  Bed Mobility Overal bed mobility: Needs Assistance Bed Mobility: Rolling, Sit to Sidelying, Sidelying to Sit Rolling:  Contact guard assist Sidelying to sit: Contact guard assist     Sit to sidelying: Min assist General bed mobility comments: Assist required to elevate LE's up into bed at end of session.    Transfers Overall transfer level: Needs assistance Equipment used: Rolling walker (2 wheels) Transfers: Sit to/from Stand Sit to Stand: Contact guard assist, Mod assist           General transfer comment: VC's for wide BOS and hand placement on seated surface for safety. no asssit from EOB, mod A from stanadard chair in hall without armrests    Ambulation/Gait Ambulation/Gait assistance: Contact guard assist Gait Distance (Feet): 32 Feet (+ 50') Assistive device: Rolling walker (2 wheels) Gait Pattern/deviations: Step-through pattern, Decreased stride length, Trunk flexed Gait velocity: Decreased     General Gait Details: VC's for improved posture, closer walker proximity, and forward gaze. Chair follow for safety as pt reports knee buckle earlier. x1 seated rest due to dizziness, BP checked and stable   Stairs Stairs:  (unable to progress to stair training this session due to dizziness and sleepiness from meds)           Wheelchair Mobility     Tilt Bed    Modified Rankin (Stroke Patients Only)       Balance Overall balance assessment: Needs assistance Sitting-balance support: Feet supported Sitting balance-Leahy Scale: Good     Standing balance support: Bilateral upper extremity supported, Reliant on assistive device for balance, During functional activity Standing balance-Leahy Scale: Poor Standing balance comment: reliant on UE support                            Communication Communication Communication: No apparent difficulties  Cognition  Arousal: Alert Behavior During Therapy: WFL for tasks assessed/performed   PT - Cognitive impairments: No apparent impairments                       PT - Cognition Comments: pt reporting feeling sleepy  from pain meds Following commands: Intact      Cueing Cueing Techniques: Verbal cues  Exercises      General Comments General comments (skin integrity, edema, etc.): VSS on RA      Pertinent Vitals/Pain Pain Assessment Pain Assessment: 0-10 Pain Score: 8  Pain Location: back Pain Descriptors / Indicators: Burning, Discomfort Pain Intervention(s): Premedicated before session, Monitored during session, Limited activity within patient's tolerance    Home Living                          Prior Function            PT Goals (current goals can now be found in the care plan section) Acute Rehab PT Goals Patient Stated Goal: Home tomorrow; be able to do aquatic therapy. PT Goal Formulation: With patient Time For Goal Achievement: 12/15/23 Progress towards PT goals: Progressing toward goals    Frequency    Min 5X/week      PT Plan      Co-evaluation              AM-PAC PT "6 Clicks" Mobility   Outcome Measure  Help needed turning from your back to your side while in a flat bed without using bedrails?: A Little Help needed moving from lying on your back to sitting on the side of a flat bed without using bedrails?: A Little Help needed moving to and from a bed to a chair (including a wheelchair)?: A Little Help needed standing up from a chair using your arms (e.g., wheelchair or bedside chair)?: A Little Help needed to walk in hospital room?: A Little Help needed climbing 3-5 steps with a railing? : A Lot 6 Click Score: 17    End of Session Equipment Utilized During Treatment: Gait belt;Back brace;Other (comment) (R AFO) Activity Tolerance: Patient tolerated treatment well;Patient limited by fatigue Patient left: in bed;with call bell/phone within reach Nurse Communication: Mobility status PT Visit Diagnosis: Unsteadiness on feet (R26.81);Pain Pain - part of body:  (back)     Time: 9562-1308 PT Time Calculation (min) (ACUTE ONLY): 24  min  Charges:    $Gait Training: 23-37 mins PT General Charges $$ ACUTE PT VISIT: 1 Visit                     [name] R. PTA Acute Rehabilitation Services Office: [phone]   Catalina Antigua 12/09/2023, 10:23 AM

## 2023-12-10 DIAGNOSIS — G894 Chronic pain syndrome: Secondary | ICD-10-CM | POA: Diagnosis not present

## 2023-12-10 DIAGNOSIS — M21371 Foot drop, right foot: Secondary | ICD-10-CM | POA: Diagnosis not present

## 2023-12-10 DIAGNOSIS — Z888 Allergy status to other drugs, medicaments and biological substances status: Secondary | ICD-10-CM | POA: Diagnosis not present

## 2023-12-10 DIAGNOSIS — Z880 Allergy status to penicillin: Secondary | ICD-10-CM | POA: Diagnosis not present

## 2023-12-10 DIAGNOSIS — M4316 Spondylolisthesis, lumbar region: Secondary | ICD-10-CM | POA: Diagnosis present

## 2023-12-10 DIAGNOSIS — M545 Low back pain, unspecified: Secondary | ICD-10-CM | POA: Diagnosis present

## 2023-12-10 DIAGNOSIS — Z91040 Latex allergy status: Secondary | ICD-10-CM | POA: Diagnosis not present

## 2023-12-10 DIAGNOSIS — Z79899 Other long term (current) drug therapy: Secondary | ICD-10-CM | POA: Diagnosis not present

## 2023-12-10 DIAGNOSIS — Y838 Other surgical procedures as the cause of abnormal reaction of the patient, or of later complication, without mention of misadventure at the time of the procedure: Secondary | ICD-10-CM | POA: Diagnosis present

## 2023-12-10 DIAGNOSIS — Z885 Allergy status to narcotic agent status: Secondary | ICD-10-CM | POA: Diagnosis not present

## 2023-12-10 DIAGNOSIS — M5416 Radiculopathy, lumbar region: Secondary | ICD-10-CM | POA: Diagnosis present

## 2023-12-10 DIAGNOSIS — I10 Essential (primary) hypertension: Secondary | ICD-10-CM | POA: Diagnosis not present

## 2023-12-10 DIAGNOSIS — M9689 Other intraoperative and postprocedural complications and disorders of the musculoskeletal system: Secondary | ICD-10-CM | POA: Diagnosis not present

## 2023-12-10 DIAGNOSIS — K219 Gastro-esophageal reflux disease without esophagitis: Secondary | ICD-10-CM | POA: Diagnosis not present

## 2023-12-10 DIAGNOSIS — I700 Atherosclerosis of aorta: Secondary | ICD-10-CM | POA: Diagnosis not present

## 2023-12-10 DIAGNOSIS — F419 Anxiety disorder, unspecified: Secondary | ICD-10-CM | POA: Diagnosis present

## 2023-12-10 DIAGNOSIS — E785 Hyperlipidemia, unspecified: Secondary | ICD-10-CM | POA: Diagnosis present

## 2023-12-10 DIAGNOSIS — Z8249 Family history of ischemic heart disease and other diseases of the circulatory system: Secondary | ICD-10-CM | POA: Diagnosis not present

## 2023-12-10 NOTE — Progress Notes (Signed)
Occupational Therapy Treatment Patient Details Name: [name] MRN: [id number] DOB: 07/27/1961 Today's Date: 12/10/2023   History of present illness Pt is a 63 y/o female who presents s/p L4-5 PLIF on 12/07/2023. PMH significant for HTN, LVH, syncope, R foot drop.   OT comments  Pt is making continued progress towards acute OT goals. Pt continues to be limited by deficits listed below. Pt required verbal encouragement to engage in therapy session on this date. Pt and family educated on the importance of pt mobilizing once every hr for healing purposes, reduction of pain, and to return to PLOF. Pt with great recall of back precautions and able to adhere functionally. Pt provided with TOTAL A to don shoes onto B feet and back brace seated EOB. Overall, pt completed functional mobility with up to MIN A. Pt performed functional ambulation throughout hallway using RW with CGA. Pt reported increased pain in lower back, radiating to buttocks with ambulation and required ~3 standing rest breaks to sustain activity tolerance and endurance. Pt provided with ice pack at the end of session for pain management. OT to continue following pt acutely to address functional needs with the follow-up recommendation of HHOT to maximize pt functional independence and to ensure safe discharge to home environment.       If plan is discharge home, recommend the following:  A little help with walking and/or transfers;A lot of help with bathing/dressing/bathroom;Assistance with cooking/housework;Assist for transportation;Help with stairs or ramp for entrance   Equipment Recommendations  None recommended by OT    Recommendations for Other Services      Precautions / Restrictions Precautions Precautions: Fall;Back Precaution Booklet Issued: Yes (comment) Recall of Precautions/Restrictions: Intact Required Braces or Orthoses: Spinal Brace Spinal Brace: Lumbar corset;Applied in sitting  position Restrictions Weight Bearing Restrictions Per Provider Order: No       Mobility Bed Mobility Overal bed mobility: Needs Assistance Bed Mobility: Rolling, Sit to Sidelying, Sidelying to Sit Rolling: Supervision Sidelying to sit: Contact guard assist     Sit to sidelying: Min assist General bed mobility comments: Assist required to elevate LE's up into bed at end of session.    Transfers Overall transfer level: Needs assistance Equipment used: Rolling walker (2 wheels) Transfers: Sit to/from Stand Sit to Stand: Min assist           General transfer comment: Required 2 attempts to stand from EOB.     Balance Overall balance assessment: Needs assistance Sitting-balance support: Bilateral upper extremity supported, Feet supported Sitting balance-Leahy Scale: Good     Standing balance support: Bilateral upper extremity supported, Reliant on assistive device for balance, During functional activity Standing balance-Leahy Scale: Poor Standing balance comment: Completed functional ambulation throughout hallway. Requires UE support to maintain balance                           ADL either performed or assessed with clinical judgement   ADL Overall ADL's : Needs assistance/impaired                     Lower Body Dressing: Total assistance Lower Body Dressing Details (indicate cue type and reason): Donning shoes onto B feet, AFO, and back brace             Functional mobility during ADLs: Minimal assistance;Rolling walker (2 wheels) General ADL Comments: Pt reported burning sensation in lower back radiating to buttocks when performing functional mobility. Pain intensified with increased activity.  Extremity/Trunk Assessment Upper Extremity Assessment Upper Extremity Assessment: Overall WFL for tasks assessed   Lower Extremity Assessment Lower Extremity Assessment: Defer to PT evaluation        Vision   Vision Assessment?: No apparent  visual deficits   Perception Perception Perception: Not tested   Praxis Praxis Praxis: Not tested   Communication Communication Communication: No apparent difficulties   Cognition Arousal: Alert Behavior During Therapy: WFL for tasks assessed/performed Cognition: No apparent impairments             OT - Cognition Comments: Requires verbal encouragement to engage in therapy session. Able to recall and functionally adhere to back precautions.                 Following commands: Intact        Cueing   Cueing Techniques: Verbal cues, Tactile cues  Exercises      Shoulder Instructions       General Comments VSS on RA    Pertinent Vitals/ Pain       Pain Assessment Pain Assessment: Faces Faces Pain Scale: Hurts even more Pain Location: back Pain Descriptors / Indicators: Burning, Discomfort, Grimacing, Radiating Pain Intervention(s): Monitored during session, Limited activity within patient's tolerance, Ice applied  Home Living                                          Prior Functioning/Environment              Frequency  Min 1X/week        Progress Toward Goals  OT Goals(current goals can now be found in the care plan section)  Progress towards OT goals: Progressing toward goals  Acute Rehab OT Goals Patient Stated Goal: to stop pain OT Goal Formulation: With patient Time For Goal Achievement: 12/22/23 Potential to Achieve Goals: Good ADL Goals Pt Will Perform Grooming: with contact guard assist;standing Pt Will Perform Lower Body Dressing: with min assist;sit to/from stand Pt Will Transfer to Toilet: with supervision;ambulating;regular height toilet Pt Will Perform Toileting - Clothing Manipulation and hygiene: with modified independence;sitting/lateral leans  Plan      Co-evaluation                 AM-PAC OT "6 Clicks" Daily Activity     Outcome Measure   Help from another person eating meals?: None Help  from another person taking care of personal grooming?: A Little Help from another person toileting, which includes using toliet, bedpan, or urinal?: A Little Help from another person bathing (including washing, rinsing, drying)?: A Lot Help from another person to put on and taking off regular upper body clothing?: A Little Help from another person to put on and taking off regular lower body clothing?: A Lot 6 Click Score: 17    End of Session Equipment Utilized During Treatment: Gait belt;Rolling walker (2 wheels);Back brace  OT Visit Diagnosis: Unsteadiness on feet (R26.81);Other abnormalities of gait and mobility (R26.89);Muscle weakness (generalized) (M62.81);History of falling (Z91.81);Pain Pain - part of body:  (lower back)   Activity Tolerance Patient limited by pain   Patient Left in bed;with call bell/phone within reach;with family/visitor present (Sister and brother-in-law present at bedside)   Nurse Communication Mobility status        Time: 9562-1308 OT Time Calculation (min): 17 min  Charges: OT General Charges $OT Visit: 1 Visit OT Treatments $Therapeutic Activity: 8-22  mins  Kevan Ny, Darliss Cheney 12/10/2023, 2:03 PM

## 2023-12-10 NOTE — Progress Notes (Signed)
Physical Therapy Treatment Patient Details Name: [name] MRN: [id number] DOB: [birth year] Today's Date: 12/10/2023   History of Present Illness Pt is a 63 y/o female who presents s/p L4-5 PLIF on 12/07/2023. PMH significant for HTN, LVH, syncope, R foot drop.    PT Comments  Pt resting in bed on arrival and agreeable to session with continued progress towards acute goals. Pt progressing gait tolerance and able to perform stair training this session with grossly CGA-min A for HHA on stair ascent. Pt demonstrating bed mobility and transfers with grossly CGA for safety. Pt with good recall for all precautions and demonstrating good adherence throughout session. Continued education on precautions,appropriate activity progression, and car transfer, with pt verbalizing understanding. Anticipate safe discharge, with assist level outlined below, once medically cleared, will continue to follow acutely.      If plan is discharge home, recommend the following: A little help with bathing/dressing/bathroom;Assistance with cooking/housework;Help with stairs or ramp for entrance;Assist for transportation;A little help with walking and/or transfers   Can travel by private vehicle        Equipment Recommendations  None recommended by PT    Recommendations for Other Services       Precautions / Restrictions Precautions Precautions: Fall;Back Recall of Precautions/Restrictions: Intact Required Braces or Orthoses: Spinal Brace (R AFO) Spinal Brace: Lumbar corset;Applied in sitting position Restrictions Weight Bearing Restrictions Per Provider Order: No     Mobility  Bed Mobility Overal bed mobility: Needs Assistance Bed Mobility: Rolling, Sit to Sidelying, Sidelying to Sit Rolling: Contact guard assist Sidelying to sit: Contact guard assist     Sit to sidelying: Min assist General bed mobility comments: Assist required to elevate LE's up into bed at end of session.     Transfers Overall transfer level: Needs assistance Equipment used: Rolling walker (2 wheels) Transfers: Sit to/from Stand Sit to Stand: Contact guard assist, Mod assist           General transfer comment: good recall for hand placement    Ambulation/Gait Ambulation/Gait assistance: Contact guard assist Gait Distance (Feet): 90 Feet Assistive device: Rolling walker (2 wheels) Gait Pattern/deviations: Step-through pattern, Decreased stride length, Trunk flexed Gait velocity: Decreased     General Gait Details: Chair follow for safety as pt reports knee buckle earlier. no knee buckle noted,   Stairs Stairs: Yes Stairs assistance: Contact guard assist, Min assist Stair Management: One rail Left, Step to pattern, Forwards Number of Stairs: 3 General stair comments: up/down sinlge step in room with bedrail on L to simulate home set-up,  light cues for sequencing on descent, HHA on R as pt reporting she uses SPC for stair negotiation at home   Wheelchair Mobility     Tilt Bed    Modified Rankin (Stroke Patients Only)       Balance Overall balance assessment: Needs assistance Sitting-balance support: Feet supported Sitting balance-Leahy Scale: Good     Standing balance support: Bilateral upper extremity supported, Reliant on assistive device for balance, During functional activity Standing balance-Leahy Scale: Poor Standing balance comment: reliant on UE support                            Communication Communication Communication: No apparent difficulties  Cognition Arousal: Alert Behavior During Therapy: WFL for tasks assessed/performed   PT - Cognitive impairments: No apparent impairments  Following commands: Intact      Cueing Cueing Techniques: Verbal cues, Tactile cues, Visual cues  Exercises      General Comments General comments (skin integrity, edema, etc.): VSS on RA, pt sister present and  supportive      Pertinent Vitals/Pain Pain Assessment Pain Assessment: Faces Faces Pain Scale: Hurts even more Pain Location: back Pain Descriptors / Indicators: Shooting, Spasm, Sore Pain Intervention(s): Premedicated before session, Monitored during session, Limited activity within patient's tolerance, Ice applied    Home Living                          Prior Function            PT Goals (current goals can now be found in the care plan section) Acute Rehab PT Goals Patient Stated Goal: Home tomorrow; be able to do aquatic therapy. PT Goal Formulation: With patient Time For Goal Achievement: 12/15/23 Progress towards PT goals: Progressing toward goals    Frequency    Min 5X/week      PT Plan      Co-evaluation              AM-PAC PT "6 Clicks" Mobility   Outcome Measure  Help needed turning from your back to your side while in a flat bed without using bedrails?: A Little Help needed moving from lying on your back to sitting on the side of a flat bed without using bedrails?: A Little Help needed moving to and from a bed to a chair (including a wheelchair)?: A Little Help needed standing up from a chair using your arms (e.g., wheelchair or bedside chair)?: A Little Help needed to walk in hospital room?: A Little Help needed climbing 3-5 steps with a railing? : A Little 6 Click Score: 18    End of Session Equipment Utilized During Treatment: Gait belt;Back brace;Other (comment) (R AFO) Activity Tolerance: Patient tolerated treatment well;Patient limited by fatigue Patient left: in bed;with call bell/phone within reach;with family/visitor present Nurse Communication: Mobility status PT Visit Diagnosis: Unsteadiness on feet (R26.81);Pain Pain - part of body:  (back)     Time: 0912-0930 PT Time Calculation (min) (ACUTE ONLY): 18 min  Charges:    $Gait Training: 8-22 mins PT General Charges $$ ACUTE PT VISIT: 1 Visit                      Monterio Bob R. PTA Acute Rehabilitation Services Office: [phone]   Catalina Antigua 12/10/2023, 9:48 AM

## 2023-12-10 NOTE — Progress Notes (Signed)
Subjective: Patient reports back is getting a little better. She thinks her O2 sats were maybe low? No SOB or chest pain  Objective: Vital signs in last 24 hours: Temp:  [97.5 F (36.4 C)-98.4 F (36.9 C)] 98.3 F (36.8 C) (02/13 0323) Pulse Rate:  [71-89] 89 (02/13 0323) Resp:  [17-18] 17 (02/13 0323) BP: (93-130)/(54-77) 112/77 (02/13 0323) SpO2:  [86 %-100 %] 95 % (02/13 0323)  Intake/Output from previous day: 02/12 0701 - 02/13 0700 In: 480 [P.O.:480] Out: 1 [Urine:1] Intake/Output this shift: No intake/output data recorded.  Neurologic: Grossly normal except pre-op R foot drop  Lab Results: Lab Results  Component Value Date   WBC 5.7 12/01/2023   HGB 13.4 12/01/2023   HCT 41.8 12/01/2023   MCV 93.5 12/01/2023   PLT 303 12/01/2023   Lab Results  Component Value Date   INR 1.0 12/01/2023   BMET Lab Results  Component Value Date   NA 139 12/01/2023   K 4.0 12/01/2023   CL 103 12/01/2023   CO2 29 12/01/2023   GLUCOSE 85 12/01/2023   BUN 10 12/01/2023   CREATININE 1.24 (H) 12/01/2023   CALCIUM 9.3 12/01/2023    Studies/Results: No results found.  Assessment/Plan: Continue PT/OT Continue pain control Home in am  Estimated body mass index is 27.28 kg/m as calculated from the following:   Height as of this encounter: 5\' 6"  (1.676 m).   Weight as of this encounter: 76.7 kg.    LOS: 0 days    Tia Alert 12/10/2023, 7:25 AM

## 2023-12-11 ENCOUNTER — Other Ambulatory Visit: Payer: Self-pay

## 2023-12-11 ENCOUNTER — Other Ambulatory Visit (HOSPITAL_COMMUNITY): Payer: Self-pay

## 2023-12-11 MED ORDER — POLYETHYLENE GLYCOL 3350 17 G PO PACK
17.0000 g | PACK | Freq: Every day | ORAL | Status: DC | PRN
Start: 2023-12-11 — End: 2023-12-11
  Administered 2023-12-11: 17 g via ORAL
  Filled 2023-12-11 (×2): qty 1

## 2023-12-11 MED ORDER — DIPHENHYDRAMINE HCL 25 MG PO CAPS
25.0000 mg | ORAL_CAPSULE | Freq: Four times a day (QID) | ORAL | Status: DC | PRN
  Administered 2023-12-11: 25 mg via ORAL
  Filled 2023-12-11: qty 1

## 2023-12-11 MED ORDER — TRAMADOL HCL 50 MG PO TABS
50.0000 mg | ORAL_TABLET | Freq: Four times a day (QID) | ORAL | 0 refills | Status: DC | PRN
Start: 2023-12-11 — End: 2023-12-26
  Filled 2023-12-11: qty 30, 4d supply, fill #0

## 2023-12-11 MED ORDER — METHOCARBAMOL 500 MG PO TABS
500.0000 mg | ORAL_TABLET | Freq: Three times a day (TID) | ORAL | 1 refills | Status: DC | PRN
  Filled 2023-12-11: qty 30, 10d supply, fill #0

## 2023-12-11 NOTE — TOC Transition Note (Signed)
Transition of Care [facility]) - Discharge Note   Patient Details  Name: [name] MRN: [id number] Date of Birth: 1961/06/14  Transition of [facility]) CM/SW Contact:  Kermit Balo, RN Phone Number: 12/11/2023, 3:07 PM   Clinical Narrative:     Pt is discharging home with home health through [facility] information on the AVS. [facility] will contact her for the first home visit. Pt has transportation home.   Final next level of care: Home w Home Health Services Barriers to Discharge: No Barriers Identified   Patient Goals and CMS Choice   CMS Medicare.gov Compare Post Acute Care list provided to:: Patient Choice offered to / list presented to : Patient      Discharge Placement                       Discharge Plan and Services Additional resources added to the After Visit Summary for     Discharge Planning Services: CM Consult Post Acute Care Choice: Home Health                    HH Arranged: PT, OT [facility] Agency: [facility] Date [facility] Agency Contacted: 12/09/23   Representative spoke with at [facility] Agency: Amy  Social Drivers of Health (SDOH) Interventions SDOH Screenings   Food Insecurity: No Food Insecurity (12/08/2023)  Housing: Low Risk  (12/09/2023)  Transportation Needs: No Transportation Needs (12/08/2023)  Utilities: Not At Risk (12/08/2023)  Alcohol Screen: Low Risk  (10/02/2023)  Depression (PHQ2-9): Low Risk  (10/14/2023)  Financial Resource Strain: Low Risk  (10/02/2023)  Physical Activity: Insufficiently Active (10/02/2023)  Social Connections: Socially Integrated (10/02/2023)  Stress: No Stress Concern Present (10/02/2023)  Tobacco Use: Low Risk  (12/07/2023)  Health Literacy: Adequate Health Literacy (10/02/2023)     Readmission Risk Interventions     No data to display

## 2023-12-11 NOTE — Discharge Summary (Signed)
Physician Discharge Summary  Patient ID: [name] MRN: [id number] DOB/AGE: 03/29/1961 63 y.o.  Admit date: 12/07/2023 Discharge date: 12/11/2023  Admission Diagnoses: Postlaminectomy spondylolisthesis L4-5 with back pain and radicular pain     Discharge Diagnoses: same   Discharged Condition: good  Hospital Course: The patient was admitted on 12/07/2023 and taken to the operating room where the patient underwent PLIF L4-5. The patient tolerated the procedure well and was taken to the recovery room and then to the floor in stable condition. The hospital course was routine. There were no complications. The wound remained clean dry and intact. Pt had appropriate back soreness. No complaints of leg pain or new N/T/W. The patient remained afebrile with stable vital signs, and tolerated a regular diet. The patient continued to increase activities, and pain was well controlled with oral pain medications.   Consults: None  Significant Diagnostic Studies:  Results for orders placed or performed during the hospital encounter of 12/03/23  VAS [place] ABI WITH/WO TBI   Collection Time: 12/03/23  9:39 AM  Result Value Ref Range   Right ABI 1.15    Left ABI 1.15    *Note: Due to a large number of results and/or encounters for the requested time period, some results have not been displayed. A complete set of results can be found in Results Review.    DG Lumbar Spine 2-3 Views Result Date: 12/07/2023 CLINICAL DATA:  Elective surgery. EXAM: LUMBAR SPINE - 2-3 VIEW COMPARISON:  None Available. FINDINGS: Two fluoroscopic spot views of the lumbar spine submitted from the operating room. Pedicle screws at L4 and L5. Fluoroscopy time 35 seconds. Dose 20.91 mGy. IMPRESSION: Procedural fluoroscopy during lumbar surgery. Electronically Signed   By: Narda Rutherford M.D.   On: 12/07/2023 12:07   DG C-Arm 1-60 Min-No Report Result Date: 12/07/2023 Fluoroscopy was utilized by the requesting physician.   No radiographic interpretation.   DG C-Arm 1-60 Min-No Report Result Date: 12/07/2023 Fluoroscopy was utilized by the requesting physician.  No radiographic interpretation.   VAS [place] ABI WITH/WO TBI Result Date: 12/04/2023  LOWER EXTREMITY DOPPLER STUDY Patient Name:  [name]  Date of Exam:   12/03/2023 Medical Rec #: [id number]                 Accession #:    [id number] Date of Birth: [birth year]                Patient Gender: F Patient Age:   63 years Exam Location:  [facility] Procedure:      VAS [place] ABI WITH/WO TBI Referring Phys: --------------------------------------------------------------------------------  Indications: Patient complains of bilateral calf nocturnal cramping, left >              right, over the last 3 months. She uses a cane for assistance due              to right drop foot since 2019. She is scheduled for lumbar spinal              fusion next week. High Risk Factors: Hypertension, hyperlipidemia, no history of smoking. Other Factors: History of CKD stage 3a.  Comparison Study: None Performing Technologist: Carlos [employer] RVT  Examination Guidelines: A complete evaluation includes at minimum, Doppler waveform signals and systolic blood pressure reading at the level of bilateral brachial, anterior tibial, and posterior tibial arteries, when vessel segments are accessible. Bilateral testing is considered an integral part of a complete examination. Photoelectric Plethysmograph (PPG) waveforms and  toe systolic pressure readings are included as required and additional duplex testing as needed. Limited examinations for reoccurring indications may be performed as noted.  ABI Findings: +---------+------------------+-----+---------+--------+ Right    Rt Pressure (mmHg)IndexWaveform Comment  +---------+------------------+-----+---------+--------+ Brachial 165                                      +---------+------------------+-----+---------+--------+ PTA      196                1.15 triphasic         +---------+------------------+-----+---------+--------+ DP       192               1.13 triphasic         +---------+------------------+-----+---------+--------+ Great Toe139               0.82 Normal            +---------+------------------+-----+---------+--------+ +---------+------------------+-----+---------+-------+ Left     Lt Pressure (mmHg)IndexWaveform Comment +---------+------------------+-----+---------+-------+ Brachial 170                                     +---------+------------------+-----+---------+-------+ PTA      196               1.15 triphasic        +---------+------------------+-----+---------+-------+ DP       171               1.01 triphasic        +---------+------------------+-----+---------+-------+ Great Toe144               0.85 Normal           +---------+------------------+-----+---------+-------+ +-------+-----------+-----------+------------+------------+ ABI/TBIToday's ABIToday's TBIPrevious ABIPrevious TBI +-------+-----------+-----------+------------+------------+ Right  1.15       .82                                 +-------+-----------+-----------+------------+------------+ Left   1.15       .85                                 +-------+-----------+-----------+------------+------------+   Summary: Right: Resting right ankle-brachial index is within normal range. The right toe-brachial index is normal. Left: Resting left ankle-brachial index is within normal range. The left toe-brachial index is normal. *See table(s) above for measurements and observations.  Electronically signed by Julien Nordmann MD on 12/04/2023 at 3:39:58 PM.    Final     Antibiotics:  Anti-infectives (From admission, onward)    Start     Dose/Rate Route Frequency Ordered Stop   12/07/23 1415  vancomycin (VANCOCIN) IVPB 1000 mg/200 mL premix        1,000 mg 200 mL/hr over 60 Minutes Intravenous  Once 12/07/23  1406 12/07/23 1633   12/07/23 0845  vancomycin (VANCOCIN) IVPB 1000 mg/200 mL premix        1,000 mg 200 mL/hr over 60 Minutes Intravenous On call to O.R. 12/07/23 0834 12/07/23 1014       Discharge Exam: Blood pressure (!) 94/52, pulse 79, temperature (!) 97.4 F (36.3 C), temperature source Oral, resp. rate 18, height 5\' 6"  (1.676 m), weight 76.7 kg, SpO2 100%. Neurologic: Grossly normal Ambulating and voiding  well incision cdi   Discharge Medications:   Allergies as of 12/11/2023       Reactions   Cephalosporins Anaphylaxis   Penicillins Anaphylaxis, Hives   Anesthetics, Amide Nausea And Vomiting   Does not know name of it. States they put it on record foot center.    Betadine [povidone Iodine] Other (See Comments)   "Skin Burn" caused scar on Left buttock   Latex Hives, Itching, Rash, Other (See Comments)   Peach [prunus Persica] Hives   Claritin [loratadine]    pruritis   Hydrocodone    Altered mental state, feel spaced out         Medication List     STOP taking these medications    HYDROcodone-acetaminophen 5-325 MG tablet Commonly known as: NORCO/VICODIN       TAKE these medications    acetaminophen 500 MG tablet Commonly known as: TYLENOL Take 500 mg by mouth every 6 (six) hours as needed for headache.   ALPRAZolam 0.5 MG tablet Commonly known as: XANAX Take 1/2 - 1 tablet by mouth at bedtime as needed for anxiety.   amitriptyline 25 MG tablet Commonly known as: ELAVIL Take 1 tablet (25 mg total) by mouth daily.   amLODipine 5 MG tablet Commonly known as: NORVASC Take 1 tablet (5 mg total) by mouth daily.   atorvastatin 20 MG tablet Commonly known as: LIPITOR Take 1 tablet (20 mg total) by mouth daily.   baclofen 10 MG tablet Commonly known as: LIORESAL Take 1 tablet (10 mg total) by mouth 3 (three) times daily as needed.   betamethasone dipropionate 0.05 % cream Apply as directed to skin once a day to itchy areas only avoiding normal  skin as needed   carvedilol 25 MG tablet Commonly known as: COREG Take 1 tablet (25 mg total) by mouth 2 (two) times daily with a meal. What changed: how much to take   cholecalciferol 25 MCG (1000 UNIT) tablet Commonly known as: VITAMIN D3 Take 1,000 Units by mouth daily.   diphenhydrAMINE 25 MG tablet Commonly known as: BENADRYL Take 25 mg by mouth daily as needed for itching.   DULoxetine 30 MG capsule Commonly known as: Cymbalta Take 2 capsules (60 mg total) by mouth daily. What changed:  how much to take when to take this reasons to take this   Femring 0.05 MG/24HR Ring Generic drug: Estradiol Acetate USE ONE RING VAGINALLY EVERY 3 MONTHS AS DIRECTED   fluconazole 200 MG tablet Commonly known as: DIFLUCAN Take 1 tablet by mouth once a week   gabapentin 600 MG tablet Commonly known as: NEURONTIN Take 1 tablet (600 mg total) by mouth 3 (three) times daily.   hydrALAZINE 50 MG tablet Commonly known as: APRESOLINE Take 1 tablet (50 mg total) by mouth 3 (three) times daily. What changed: when to take this   methocarbamol 500 MG tablet Commonly known as: ROBAXIN Take 1 tablet (500 mg total) by mouth 3 (three) times daily as needed for muscle spasm.   ondansetron 4 MG tablet Commonly known as: ZOFRAN Take 1 tablet (4 mg tablet) 30 - 60 minutes each prep dose   pregabalin 300 MG capsule Commonly known as: LYRICA Take 1 capsule (300 mg total) by mouth 2 (two) times daily.   spironolactone 50 MG tablet Commonly known as: ALDACTONE Take 1 tablet (50 mg total) by mouth daily.   terbinafine 1 % cream Commonly known as: LAMISIL Apply 1 Application topically 2 (two) times daily as needed (rash).  traMADol 50 MG tablet Commonly known as: ULTRAM Take 1-2 tablets (50-100 mg total) by mouth every 6 (six) hours as needed for severe pain (pain score 7-10) or moderate pain (pain score 4-6). What changed:  how much to take reasons to take this                Durable Medical Equipment  (From admission, onward)           Start     Ordered   12/07/23 1406  DME Walker rolling  Once       Question:  Patient needs a walker to treat with the following condition  Answer:  S/P lumbar fusion   12/07/23 1406   12/07/23 1406  DME 3 n 1  Once        12/07/23 1406            Disposition: home   Final Dx: PLIF L4-5  Discharge Instructions     Call MD for:  difficulty breathing, headache or visual disturbances   Complete by: As directed    Call MD for:  hives   Complete by: As directed    Call MD for:  persistant dizziness or light-headedness   Complete by: As directed    Call MD for:  persistant nausea and vomiting   Complete by: As directed    Call MD for:  redness, tenderness, or signs of infection (pain, swelling, redness, odor or green/yellow discharge around incision site)   Complete by: As directed    Call MD for:  severe uncontrolled pain   Complete by: As directed    Call MD for:  temperature >100.4   Complete by: As directed    Diet - low sodium heart healthy   Complete by: As directed    Driving Restrictions   Complete by: As directed    No driving for 2 weeks, no riding in the car for 1 week   Increase activity slowly   Complete by: As directed    Lifting restrictions   Complete by: As directed    No lifting more than 8 lbs        Follow-up Information     [facility] Follow up.   Why: [facility] will contact you for the first home visit. Contact information: [address] [place] [address] [phone]                  Signed: Tiana Loft [facility] 12/11/2023, 1:58 PM

## 2023-12-11 NOTE — Progress Notes (Signed)
Physical Therapy Treatment Patient Details Name: [name] MRN: [id number] DOB: [birth year] Today's Date: 12/11/2023   History of Present Illness Pt is a 63 y/o female who presents s/p L4-5 PLIF on 12/07/2023. PMH significant for HTN, LVH, syncope, R foot drop.    PT Comments  Pt sitting up in recliner, dressed and ready to GO. Agreeable to one final, treatment session. Provided reinforcement of back precautions, education on hourly movement during waking hours and car transfers. Pt is light min A for steadying with STS and contact guard for safety with ambulation in hallway with RW. Pt ready for discharge as soon as paperwork is completed. RN notified.     If plan is discharge home, recommend the following: A little help with bathing/dressing/bathroom;Assistance with cooking/housework;Help with stairs or ramp for entrance;Assist for transportation;A little help with walking and/or transfers   Can travel by private vehicle      Yes  Equipment Recommendations  None recommended by PT       Precautions / Restrictions Precautions Precautions: Fall;Back Recall of Precautions/Restrictions: Intact Precaution/Restrictions Comments: able to recall 3/3 precautions, needs minor verbal reinforcement to maintain Required Braces or Orthoses: Spinal Brace (R AFO) Spinal Brace: Lumbar corset;Applied in sitting position Restrictions Weight Bearing Restrictions Per Provider Order: No     Mobility  Bed Mobility               General bed mobility comments: sitting up in recliner on entry    Transfers Overall transfer level: Needs assistance Equipment used: Rolling walker (2 wheels) Transfers: Sit to/from Stand Sit to Stand: Min assist           General transfer comment: good power up, light min A for steadying in standing    Ambulation/Gait Ambulation/Gait assistance: Contact guard assist Gait Distance (Feet): 50 Feet Assistive device: Rolling walker (2  wheels) Gait Pattern/deviations: Step-through pattern, Decreased stride length, Trunk flexed Gait velocity: Decreased Gait velocity interpretation: <1.31 ft/sec, indicative of household ambulator   General Gait Details: PT donned R LE brace prior to ambulation and noted to have no knee buckling with gait today, slowed steady gait, pt reports increase in pain with distance       Balance Overall balance assessment: Needs assistance Sitting-balance support: Feet supported Sitting balance-Leahy Scale: Good     Standing balance support: Bilateral upper extremity supported, Reliant on assistive device for balance, During functional activity Standing balance-Leahy Scale: Poor Standing balance comment: reliant on UE support                            Communication Communication Communication: No apparent difficulties  Cognition Arousal: Alert Behavior During Therapy: WFL for tasks assessed/performed   PT - Cognitive impairments: No apparent impairments                         Following commands: Intact      Cueing Cueing Techniques: Verbal cues, Tactile cues, Visual cues     General Comments General comments (skin integrity, edema, etc.): VSS on RA, provided education on car transfer both patient and son verbalize understanding      Pertinent Vitals/Pain Pain Assessment Pain Assessment: 0-10 Pain Score: 8  Pain Location: back Pain Descriptors / Indicators: Shooting, Sore Pain Intervention(s): Monitored during session, Limited activity within patient's tolerance, Repositioned, Patient requesting pain meds-RN notified     PT Goals (current goals can now be found in the care  plan section) Acute Rehab PT Goals Patient Stated Goal: Home tomorrow; be able to do aquatic therapy. PT Goal Formulation: With patient Time For Goal Achievement: 12/15/23 Potential to Achieve Goals: Good Progress towards PT goals: Progressing toward goals    Frequency    Min  5X/week       AM-PAC PT "6 Clicks" Mobility   Outcome Measure  Help needed turning from your back to your side while in a flat bed without using bedrails?: A Little Help needed moving from lying on your back to sitting on the side of a flat bed without using bedrails?: A Little Help needed moving to and from a bed to a chair (including a wheelchair)?: A Little Help needed standing up from a chair using your arms (e.g., wheelchair or bedside chair)?: A Little Help needed to walk in hospital room?: A Little Help needed climbing 3-5 steps with a railing? : A Little 6 Click Score: 18    End of Session Equipment Utilized During Treatment: Gait belt;Back brace;Other (comment) (R AFO) Activity Tolerance: Patient tolerated treatment well;Patient limited by fatigue Patient left: in bed;with call bell/phone within reach;with family/visitor present Nurse Communication: Mobility status PT Visit Diagnosis: Unsteadiness on feet (R26.81);Pain Pain - part of body:  (back)     Time: 1308-6578 PT Time Calculation (min) (ACUTE ONLY): 20 min  Charges:    $Gait Training: 8-22 mins PT General Charges $$ ACUTE PT VISIT: 1 Visit                     Marshal Schrecengost B. Beverely Risen PT, DPT Acute Rehabilitation Services Please use secure chat or  Call Office [phone]    Elon Alas [facility] 12/11/2023, 10:17 AM

## 2023-12-12 DIAGNOSIS — Z4789 Encounter for other orthopedic aftercare: Secondary | ICD-10-CM | POA: Diagnosis not present

## 2023-12-12 DIAGNOSIS — I130 Hypertensive heart and chronic kidney disease with heart failure and stage 1 through stage 4 chronic kidney disease, or unspecified chronic kidney disease: Secondary | ICD-10-CM | POA: Diagnosis not present

## 2023-12-12 DIAGNOSIS — I700 Atherosclerosis of aorta: Secondary | ICD-10-CM | POA: Diagnosis not present

## 2023-12-12 DIAGNOSIS — N1831 Chronic kidney disease, stage 3a: Secondary | ICD-10-CM | POA: Diagnosis not present

## 2023-12-12 DIAGNOSIS — M48061 Spinal stenosis, lumbar region without neurogenic claudication: Secondary | ICD-10-CM | POA: Diagnosis not present

## 2023-12-12 DIAGNOSIS — F339 Major depressive disorder, recurrent, unspecified: Secondary | ICD-10-CM | POA: Diagnosis not present

## 2023-12-12 DIAGNOSIS — M4316 Spondylolisthesis, lumbar region: Secondary | ICD-10-CM | POA: Diagnosis not present

## 2023-12-12 DIAGNOSIS — I5032 Chronic diastolic (congestive) heart failure: Secondary | ICD-10-CM | POA: Diagnosis not present

## 2023-12-12 DIAGNOSIS — M4722 Other spondylosis with radiculopathy, cervical region: Secondary | ICD-10-CM | POA: Diagnosis not present

## 2023-12-12 DIAGNOSIS — F418 Other specified anxiety disorders: Secondary | ICD-10-CM | POA: Diagnosis not present

## 2023-12-14 ENCOUNTER — Telehealth: Payer: Self-pay

## 2023-12-14 NOTE — Transitions of Care (Post Inpatient/ED Visit) (Addendum)
12/14/2023  Name: [name] MRN: [id number] DOB: 1961/08/24  Today's TOC FU Call Status: Today's TOC FU Call Status:: Successful TOC FU Call Completed TOC FU Call Complete Date: 12/14/23 Patient's Name and Date of Birth confirmed.  Transition Care Management Follow-up Telephone Call Date of Discharge: 12/11/23 Type of Discharge: Inpatient Admission Primary Inpatient Discharge Diagnosis:: Postlaminectomy spondylolisthesis L4-5 with back pain and radicular pain How have you been since you were released from the hospital?: Same (severe pain to back) Any questions or concerns?: No  Items Reviewed: Did you receive and understand the discharge instructions provided?: Yes Medications obtained,verified, and reconciled?: Yes (Medications Reviewed) Any new allergies since your discharge?: No Dietary orders reviewed?: Yes Type of Diet Ordered:: low sodium heart healthy diet Do you have support at home?: Yes People in Home: sibling(s), spouse Name of Support/Comfort Primary Source: sister Clemetine Marker,  Medications Reviewed Today: Medications Reviewed Today     Reviewed by Earlie Server, RN (Registered Nurse) on 12/14/23 at 0932  Med List Status: <None>   Medication Order Taking? Sig Documenting Provider Last Dose Status Informant  acetaminophen (TYLENOL) 500 MG tablet [id number] Yes Take 500 mg by mouth every 6 (six) hours as needed for headache. [provider] Taking Active Self  ALPRAZolam Prudy Feeler) 0.5 MG tablet [id number] Yes Take 1/2 - 1 tablet by mouth at bedtime as needed for anxiety. [place], [name], MD Taking Active Self  amitriptyline (ELAVIL) 25 MG tablet [id number] Yes Take 1 tablet (25 mg total) by mouth daily.  Taking Active Self  amLODipine (NORVASC) 5 MG tablet [id number] Yes Take 1 tablet (5 mg total) by mouth daily. Chrystie Nose, MD Taking Active Self  atorvastatin (LIPITOR) 20 MG tablet [id number] Yes Take 1 tablet (20 mg total) by mouth daily.  [place], [name], MD Taking Active Self  baclofen (LIORESAL) 10 MG tablet [id number] Yes Take 1 tablet (10 mg total) by mouth 3 (three) times daily as needed. Ranelle Oyster, MD Taking Active Self           Med Note Sherrie Mustache, [place] A   Mon Nov 30, 2023  1:44 PM) Pt alternates takes baclofen and methocarbamol   betamethasone dipropionate 0.05 % cream [id number] Yes Apply as directed to skin once a day to itchy areas only avoiding normal skin as needed  Taking Active   carvedilol (COREG) 25 MG tablet [id number]  Take 1 tablet (25 mg total) by mouth 2 (two) times daily with a meal.  Patient taking differently: Take 50 mg by mouth 2 (two) times daily with a meal.   Alver Sorrow, NP  Expired 12/07/23 2359 Self           Med Note (ROSE, Reza Crymes U   Mon Dec 14, 2023  9:28 AM) Reviewed Cardiology last visit 1/25 and it was noted to take 25 mg twice a day. Patient informed.   cholecalciferol (VITAMIN D3) 25 MCG (1000 UNIT) tablet [id number] Yes Take 1,000 Units by mouth daily. [provider] Taking Active Self  diphenhydrAMINE (BENADRYL) 25 MG tablet [id number] Yes Take 25 mg by mouth daily as needed for itching. [provider] Taking Active Self  DULoxetine (CYMBALTA) 30 MG capsule [id number] Yes Take 2 capsules (60 mg total) by mouth daily.  Patient taking differently: Take 30 mg by mouth daily as needed (pain).   Ranelle Oyster, MD Taking Active Self           Med Note (ROSE, Vanilla [place] U  Mon Dec 14, 2023  9:30 AM) Patient is taking 30 mg once a day.   Estradiol Acetate (FEMRING) 0.05 MG/24HR RING [id number] Yes USE ONE RING VAGINALLY EVERY 3 MONTHS AS DIRECTED  Taking Active Self  fluconazole (DIFLUCAN) 200 MG tablet [id number] Yes Take 1 tablet by mouth once a week  Taking Active Self  gabapentin (NEURONTIN) 600 MG tablet [id number] Yes Take 1 tablet (600 mg total) by mouth 3 (three) times daily. Ranelle Oyster, MD Taking Active Self  hydrALAZINE (APRESOLINE) 50 MG tablet [id number]  Yes Take 1 tablet (50 mg total) by mouth 3 (three) times daily.  Patient taking differently: Take 50 mg by mouth 2 (two) times daily.   Reather Littler D, NP Taking Active Self           Med Note (ROSE, Ralene Cork Dec 14, 2023  9:31 AM) Reports taking 2 times per day  methocarbamol (ROBAXIN) 500 MG tablet [id number] No Take 1 tablet (500 mg total) by mouth 3 (three) times daily as needed for muscle spasm.  Patient not taking: Reported on 12/14/2023   Sherryl Manges, NP Not Taking Active     Discontinued 09/05/23 1325 (Patient Preference)            Med Note>> Lucila Maine, CPhT   09/05/2023  1:25 PM non longer taking    ondansetron (ZOFRAN) 4 MG tablet [id number] Yes Take 1 tablet (4 mg tablet) 30 - 60 minutes each prep dose Sherrilyn Rist, MD Taking Active Self  pregabalin (LYRICA) 300 MG capsule [id number] No Take 1 capsule (300 mg total) by mouth 2 (two) times daily.  Patient not taking: Reported on 11/30/2023   Windell Norfolk, MD Not Taking Active Self  spironolactone (ALDACTONE) 50 MG tablet [id number] Yes Take 1 tablet (50 mg total) by mouth daily. Chrystie Nose, MD Taking Active Self  terbinafine (LAMISIL) 1 % cream [id number] Yes Apply 1 Application topically 2 (two) times daily as needed (rash). [provider] Taking Active Self  traMADol (ULTRAM) 50 MG tablet [id number] Yes Take 1-2 tablets (50-100 mg total) by mouth every 6 (six) hours as needed for severe pain (pain score 7-10) or moderate pain (pain score 4-6). Meyran, Tiana Loft, NP Taking Active             Home Care and Equipment/Supplies: Were Home Health Services Ordered?: Yes Name of Home Health Agency:: [facility] health care system ( [facility]) Has Agency set up a time to come to your home?: Yes First Home Health Visit Date: 12/12/23 Any new equipment or medical supplies ordered?: No  Functional Questionnaire: Do you need assistance with bathing/showering or dressing?: Yes (husband) Do you need  assistance with meal preparation?: Yes (sister and husband) Do you need assistance with eating?: No Do you have difficulty maintaining continence: No Do you need assistance with getting out of bed/getting out of a chair/moving?: No (lift chair) Do you have difficulty managing or taking your medications?: No  Follow up appointments reviewed: PCP Follow-up appointment confirmed?: No MD Provider Line [phone] Given: No Specialist Hospital Follow-up appointment confirmed?: Yes Date of Specialist follow-up appointment?: 12/22/23 Follow-Up Specialty Provider:: Dr. Yetta Barre Do you need transportation to your follow-up appointment?: No Do you understand care options if your condition(s) worsen?: Yes-patient verbalized understanding  SDOH Interventions Today    Flowsheet Row Most Recent Value  SDOH Interventions   Food Insecurity Interventions Intervention Not Indicated  Housing Interventions Intervention Not Indicated  Transportation Interventions  Intervention Not Indicated  Utilities Interventions Intervention Not Indicated       Goals Addressed               This Visit's Progress     Patient will report no readmissions to the hospital in the next 30 days. (pt-stated)        Current Barriers:  Medication management Patient reports that she is having severe pain not relieved by Tramadol and tylenol Provider appointments No PCP follow up scheduled as of yet.  Home Health services Active with [facility] PT and OT Patient reports no BM since before surgery ( 8 days ago)  RNCM Clinical Goal(s):  Patient will work with the Care Management team over the next 30 days to address Transition of Care Barriers: Medication Management Provider appointments Home Health services take all medications exactly as prescribed and will call provider for medication related questions as evidenced by patient report attend all scheduled medical appointments: PCP and specialist as evidenced by review  of EMR Patient will have regular bowel movements  through collaboration with RN Care manager, provider, and care team.   Interventions: Evaluation of current treatment plan related to  self management and patient's adherence to plan as established by provider  Transitions of Care:  New goal. Doctor Visits  - discussed the importance of doctor visits Post discharge activity limitations prescribed by provider reviewed Post-op wound/incision care reviewed with patient/caregiver Reviewed Signs and symptoms of infection Reviewed OTC options for constipation, Miralax, Magnesium Citrate, Fleets enema Encouraged patient to schedule a PCP follow up Reviewed with patient the importance of call MD to report unrelieved pain.  Reviewed and offer 30 day TOC program and patient has accepted. Provided my contact information and encouraged patient to call me if needed.  Patient Goals/Self-Care Activities: Participate in Transition of Care Program/Attend [facility] scheduled calls Notify RN Care Manager of TOC call rescheduling needs Take all medications as prescribed Attend all scheduled provider appointments Call provider office for new concerns or questions  Call to make an appointment with PCP Monitor your bowel movements and try to get back on a schedule that is normal for you Call surgeon for severe pain Take Over the counter meds for constipation Move about as much as you can while sticking to her activity restrictions  Follow Up Plan:  Telephone follow up appointment with care management team member scheduled for:  12/22/2023 The patient has been provided with contact information for the care management team and has been advised to call with any health related questions or concerns.         Patient is doing well but concerns about lack of BM.  Last BM was 12/06/2023. Continues to have severe pain not relieved by tylenol and tramadol Enrolled in 30 day TOC program. This chart was attached to send  to PCP and Dr. Marikay Alar with Neurosurgery ( [facility])  Lonia Chimera, RN, BSN, CEN Population Health- Transition of Care Team.  [facility] [phone]

## 2023-12-14 NOTE — Patient Instructions (Signed)
Visit Information  Thank you for taking time to visit with me today. Please don't hesitate to contact me if I can be of assistance to you before our next scheduled telephone appointment.  Following are the goals we discussed today:   Goals Addressed               This Visit's Progress     Patient will report no readmissions to the hospital in the next 30 days. (pt-stated)        Current Barriers:  Medication management Patient reports that she is having severe pain not relieved by Tramadol and tylenol Provider appointments No PCP follow up scheduled as of yet.  Home Health services Active with [facility] PT and OT Patient reports no BM since before surgery ( 8 days ago)  RNCM Clinical Goal(s):  Patient will work with the Care Management team over the next 30 days to address Transition of Care Barriers: Medication Management Provider appointments Home Health services take all medications exactly as prescribed and will call provider for medication related questions as evidenced by patient report attend all scheduled medical appointments: PCP and specialist as evidenced by review of EMR Patient will have regular bowel movements  through collaboration with RN Care manager, provider, and care team.   Interventions: Evaluation of current treatment plan related to  self management and patient's adherence to plan as established by provider  Transitions of Care:  New goal. Doctor Visits  - discussed the importance of doctor visits Post discharge activity limitations prescribed by provider reviewed Post-op wound/incision care reviewed with patient/caregiver Reviewed Signs and symptoms of infection Reviewed OTC options for constipation, Miralax, Magnesium Citrate, Fleets enema Encouraged patient to schedule a PCP follow up Reviewed with patient the importance of call MD to report unrelieved pain.  Reviewed and offer 30 day TOC program and patient has accepted. Provided my contact  information and encouraged patient to call me if needed.  Patient Goals/Self-Care Activities: Participate in Transition of Care Program/Attend [facility] scheduled calls Notify RN Care Manager of TOC call rescheduling needs Take all medications as prescribed Attend all scheduled provider appointments Call provider office for new concerns or questions  Call to make an appointment with PCP Monitor your bowel movements and try to get back on a schedule that is normal for you Call surgeon for severe pain Take Over the counter meds for constipation Move about as much as you can while sticking to her activity restrictions  Follow Up Plan:  Telephone follow up appointment with care management team member scheduled for:  12/22/2023 The patient has been provided with contact information for the care management team and has been advised to call with any health related questions or concerns.           Our next appointment is by telephone on 12/22/2023  Please call the care guide team at [phone] if you need to cancel or reschedule your appointment.   If you are experiencing a Mental Health or Behavioral Health Crisis or need someone to talk to, please call the Suicide and Crisis Lifeline: 988 call the [place] National Suicide Prevention Lifeline: 509-578-1576 or TTY: 613-786-5954 TTY (205)158-9184) to talk to a trained counselor call 1-800-273-TALK (toll free, 24 hour hotline) call 911   Patient verbalizes understanding of instructions and care plan provided today and agrees to view in MyChart. Active MyChart status and patient understanding of how to access instructions and care plan via MyChart confirmed with patient.     Lonia Chimera,  RN, BSN, [employer] Population Health- Transition of Care Team.  [facility] [phone]

## 2023-12-22 ENCOUNTER — Telehealth: Payer: Self-pay

## 2023-12-22 ENCOUNTER — Other Ambulatory Visit: Payer: Self-pay

## 2023-12-22 ENCOUNTER — Other Ambulatory Visit (HOSPITAL_COMMUNITY): Payer: Self-pay

## 2023-12-22 MED ORDER — DOXYCYCLINE MONOHYDRATE 100 MG PO TABS
100.0000 mg | ORAL_TABLET | Freq: Two times a day (BID) | ORAL | 0 refills | Status: DC
  Filled 2023-12-22: qty 28, 14d supply, fill #0

## 2023-12-22 NOTE — Patient Outreach (Signed)
 Care Management  Transitions of Care Program Transitions of Care Post-discharge week 2   12/22/2023 Name: [name] MRN: [id number] DOB: [birth year]  Subjective: [name] is a 63 y.o. year old female who is a primary care patient of [place], Timoteo Expose, MD. The Care Management team Engaged with patient Engaged with patient by telephone to assess and address transitions of care needs.   Consent to Services:  Patient was given information about care management services, agreed to services, and gave verbal consent to participate.   Assessment: Patient reports that back incision was draining yesterday.  Patient states that MD is concerned about infection. Prescribed antibiotics today. Denies fever. Patient reports that she will go back for a recheck in 2 days.  Reports that she is in a lot of pain.  Patient reports that she was not prepared for the news of infection . States she does not want another surgery. Reports that bowels are moving now. Use Miralax.  Reports daily BM today. Patient currently at pharmacy getting prescription. Confirmed prescription is Doxycycline.          SDOH Interventions    Flowsheet Row Telephone from 12/14/2023 in [place] [facility]PART[facility] Admission (Discharged) from 12/07/2023 in [place] [facility] Clinical Support from 10/02/2023 in [facility] at [place] Clinical Support from 09/29/2022 in [facility] at [place] Chronic Care Management from 08/05/2022 in [facility] at [place] Chronic Care Management from 06/11/2021 in [facility] at [place]  SDOH Interventions        Food Insecurity Interventions Intervention Not Indicated Intervention Not Indicated Intervention Not Indicated Intervention Not Indicated -- --  Housing Interventions Intervention Not Indicated Intervention Not Indicated Intervention Not Indicated Intervention Not  Indicated -- --  Transportation Interventions Intervention Not Indicated -- Intervention Not Indicated Intervention Not Indicated Intervention Not Indicated Intervention Not Indicated  Utilities Interventions Intervention Not Indicated -- Intervention Not Indicated Intervention Not Indicated -- --  Alcohol Usage Interventions -- -- Intervention Not Indicated (Score <7) Intervention Not Indicated (Score <7) -- --  Financial Strain Interventions -- -- Intervention Not Indicated Intervention Not Indicated -- Intervention Not Indicated  Physical Activity Interventions -- -- Intervention Not Indicated Intervention Not Indicated -- --  Stress Interventions -- -- Intervention Not Indicated Intervention Not Indicated -- --  Social Connections Interventions -- -- Intervention Not Indicated Intervention Not Indicated -- --  Health Literacy Interventions -- -- Intervention Not Indicated -- -- --        Goals Addressed               This Visit's Progress     Patient will report no readmissions to the hospital in the next 30 days. (pt-stated)        Current Barriers:  Medication management Patient reports that she is having severe pain not relieved by Tramadol and tylenol. 12/22/2023   Reports that she continues to have severe pain. Saw MD today and ? Of infection of incision. Will start antibiotics today. 7/10 Provider appointments No PCP follow up scheduled as of yet. 12/22/2023  Will call today.  Home Health services Active with [facility] and OT  12/22/23  Home health has started. PT and OT Patient reports no BM since before surgery ( 8 days ago). 12/22/23  Reports bowels are moving well now.  States she is having a BM daily.   RNCM Clinical Goal(s):  Patient will work with the Care Management team  over the next 30 days to address Transition of Care Barriers: Medication Management Provider appointments Home Health services take all medications exactly as prescribed and will call provider for  medication related questions as evidenced by patient report attend all scheduled medical appointments: PCP and specialist as evidenced by review of EMR Patient will have regular bowel movements  through collaboration with RN Care manager, provider, and care team.   Interventions: Evaluation of current treatment plan related to  self management and patient's adherence to plan as established by provider  Transitions of Care:  Goal on track:  Yes. Doctor Visits  - discussed the importance of doctor visits Assessed for fever and pain Encouraged patient to schedule a PCP follow up Reviewed with patient the importance of call MD to report unrelieved pain. Reviewed importance of taking all antibiotics as prescribed and follow up scheduled in 2 days.  Provided a listening ear.  Patient Goals/Self-Care Activities: Participate in Transition of Care Program/Attend TOC scheduled calls Take all medications as prescribed Attend all scheduled provider appointments Call provider office for new concerns or questions  Call to make an appointment with PCP Call surgeon for severe pain   Follow Up Plan:  Telephone follow up appointment with care management team member scheduled for:  12/28/2023 The patient has been provided with contact information for the care management team and has been advised to call with any health related questions or concerns.         Today's Vitals   12/22/23 1530  PainSc: 7      Plan: Telephone follow up appointment with care management team member scheduled for: 12/28/2023  Lonia Chimera, RN, BSN, CEN Population Health- Transition of Care Team.  [facility] [phone]

## 2023-12-22 NOTE — Patient Outreach (Signed)
 Care Management  Transitions of Care Program Transitions of Care Post-discharge week 2  12/22/2023 Name: [name] MRN: [id number] DOB: [birth year]  Subjective: [name] is a 63 y.o. year old female who is a primary care patient of [place], Timoteo Expose, MD. The Care Management team was unable to reach the patient by phone to assess and address transitions of care needs.   Plan: Additional outreach attempts will be made to reach the patient enrolled in the [facility] Program (Post Inpatient/ED Visit).  Lonia Chimera, RN, BSN, CEN [employer]- Transition of Care Team.  [facility] [phone]

## 2023-12-22 NOTE — Patient Instructions (Signed)
 Visit Information  Thank you for taking time to visit with me today. Please don't hesitate to contact me if I can be of assistance to you before our next scheduled telephone appointment.    Following is a copy of your care plan:   Goals Addressed               This Visit's Progress     TOC Care Plan- Patient will report no readmissions to the hospital in the next 30 days. (pt-stated)        Current Barriers:  Medication management Patient reports that she is having severe pain not relieved by Tramadol and tylenol. 12/22/2023   Reports that she continues to have severe pain. Saw MD today and ? Of infection of incision. Will start antibiotics today. 7/10 Provider appointments No PCP follow up scheduled as of yet. 12/22/2023  Will call today.  Home Health services Active with [facility] PT and OT  12/22/23  Home health has started. PT and OT Patient reports no BM since before surgery ( 8 days ago). 12/22/23  Reports bowels are moving well now.  States she is having a BM daily.   RNCM Clinical Goal(s):  Patient will work with the Care Management team over the next 30 days to address Transition of Care Barriers: Medication Management Provider appointments Home Health services take all medications exactly as prescribed and will call provider for medication related questions as evidenced by patient report attend all scheduled medical appointments: PCP and specialist as evidenced by review of EMR Patient will have regular bowel movements  through collaboration with RN Care manager, provider, and care team.   Interventions: Evaluation of current treatment plan related to  self management and patient's adherence to plan as established by provider  Transitions of Care:  Goal on track:  Yes. Doctor Visits  - discussed the importance of doctor visits Assessed for fever and pain Encouraged patient to schedule a PCP follow up Reviewed with patient the importance of call MD to report unrelieved  pain. Reviewed importance of taking all antibiotics as prescribed and follow up scheduled in 2 days.  Provided a listening ear.  Patient Goals/Self-Care Activities: Participate in Transition of Care Program/Attend TOC scheduled calls Take all medications as prescribed Attend all scheduled provider appointments Call provider office for new concerns or questions  Call to make an appointment with PCP Call surgeon for severe pain   Follow Up Plan:  Telephone follow up appointment with care management team member scheduled for:  12/28/2023 The patient has been provided with contact information for the care management team and has been advised to call with any health related questions or concerns.             Lonia Chimera, RN, BSN, CEN [employer]- Transition of Care Team.  [facility] [phone]

## 2023-12-24 ENCOUNTER — Other Ambulatory Visit: Payer: Self-pay | Admitting: Neurological Surgery

## 2023-12-24 ENCOUNTER — Other Ambulatory Visit: Payer: Self-pay

## 2023-12-24 ENCOUNTER — Encounter (HOSPITAL_COMMUNITY): Payer: Self-pay | Admitting: Neurological Surgery

## 2023-12-24 NOTE — Progress Notes (Signed)
 PCP - Dr Betty [place] Cardiologist - Dr Zoila Shutter  Chest x-ray - 03/19/22 EKG - 11/12/23 Stress Test - 04/18/15 ECHO - 07/03/22 Cardiac Cath - 04/18/15  ICD Pacemaker/Loop - n/a  Sleep Study -  n/a  Diabetes - n/a  Aspirin and Blood Thinner Instructions:  n/a  ERAS - clear liquids til 12:45 PM DOS.  Anesthesia review: Yes, Fayrene Fearing, PA on 12/01/23, no changes since 12/01/23 per patient.  STOP now taking any Aspirin (unless otherwise instructed by your surgeon), Aleve, Naproxen, Ibuprofen, Motrin, Advil, Goody's, BC's, all herbal medications, fish oil, and all vitamins.   Coronavirus Screening Do you have any of the following symptoms:  Cough yes/no: No Fever (>100.55F)  yes/no: No Runny nose yes/no: No Sore throat yes/no: No Difficulty breathing/shortness of breath  yes/no: No  Have you traveled in the last 14 days and where? yes/no: No  Patient verbalized understanding of instructions that were given via phone.

## 2023-12-25 ENCOUNTER — Inpatient Hospital Stay (HOSPITAL_COMMUNITY)
Admission: RE | Admit: 2023-12-25 | Discharge: 2023-12-26 | DRG: 903 | Disposition: A | Discharge status: Home / Self Care | Payer: PPO | Attending: Neurological Surgery | Admitting: Neurological Surgery

## 2023-12-25 ENCOUNTER — Inpatient Hospital Stay (HOSPITAL_COMMUNITY): Payer: PPO | Admitting: Anesthesiology

## 2023-12-25 ENCOUNTER — Encounter (HOSPITAL_COMMUNITY): Payer: Self-pay | Admitting: Neurological Surgery

## 2023-12-25 ENCOUNTER — Other Ambulatory Visit: Payer: Self-pay

## 2023-12-25 ENCOUNTER — Encounter (HOSPITAL_COMMUNITY)
Admission: RE | Disposition: A | Discharge status: Home / Self Care | Payer: Self-pay | Source: Home / Self Care | Attending: Neurological Surgery

## 2023-12-25 DIAGNOSIS — Z91048 Other nonmedicinal substance allergy status: Secondary | ICD-10-CM | POA: Diagnosis not present

## 2023-12-25 DIAGNOSIS — Z885 Allergy status to narcotic agent status: Secondary | ICD-10-CM | POA: Diagnosis not present

## 2023-12-25 DIAGNOSIS — Z803 Family history of malignant neoplasm of breast: Secondary | ICD-10-CM

## 2023-12-25 DIAGNOSIS — Y831 Surgical operation with implant of artificial internal device as the cause of abnormal reaction of the patient, or of later complication, without mention of misadventure at the time of the procedure: Secondary | ICD-10-CM | POA: Diagnosis present

## 2023-12-25 DIAGNOSIS — T8131XA Disruption of external operation (surgical) wound, not elsewhere classified, initial encounter: Secondary | ICD-10-CM | POA: Diagnosis not present

## 2023-12-25 DIAGNOSIS — Z79899 Other long term (current) drug therapy: Secondary | ICD-10-CM | POA: Diagnosis not present

## 2023-12-25 DIAGNOSIS — Z884 Allergy status to anesthetic agent status: Secondary | ICD-10-CM | POA: Diagnosis not present

## 2023-12-25 DIAGNOSIS — K219 Gastro-esophageal reflux disease without esophagitis: Secondary | ICD-10-CM | POA: Diagnosis present

## 2023-12-25 DIAGNOSIS — Z8249 Family history of ischemic heart disease and other diseases of the circulatory system: Secondary | ICD-10-CM | POA: Diagnosis not present

## 2023-12-25 DIAGNOSIS — E785 Hyperlipidemia, unspecified: Secondary | ICD-10-CM | POA: Diagnosis not present

## 2023-12-25 DIAGNOSIS — Z880 Allergy status to penicillin: Secondary | ICD-10-CM

## 2023-12-25 DIAGNOSIS — Z888 Allergy status to other drugs, medicaments and biological substances status: Secondary | ICD-10-CM | POA: Diagnosis not present

## 2023-12-25 DIAGNOSIS — I10 Essential (primary) hypertension: Secondary | ICD-10-CM | POA: Diagnosis not present

## 2023-12-25 DIAGNOSIS — G894 Chronic pain syndrome: Secondary | ICD-10-CM | POA: Diagnosis present

## 2023-12-25 DIAGNOSIS — T148XXD Other injury of unspecified body region, subsequent encounter: Principal | ICD-10-CM | POA: Diagnosis present

## 2023-12-25 DIAGNOSIS — Z807 Family history of other malignant neoplasms of lymphoid, hematopoietic and related tissues: Secondary | ICD-10-CM | POA: Diagnosis not present

## 2023-12-25 DIAGNOSIS — L7682 Other postprocedural complications of skin and subcutaneous tissue: Secondary | ICD-10-CM | POA: Diagnosis not present

## 2023-12-25 DIAGNOSIS — Z801 Family history of malignant neoplasm of trachea, bronchus and lung: Secondary | ICD-10-CM | POA: Diagnosis not present

## 2023-12-25 DIAGNOSIS — N1831 Chronic kidney disease, stage 3a: Secondary | ICD-10-CM

## 2023-12-25 DIAGNOSIS — I700 Atherosclerosis of aorta: Secondary | ICD-10-CM | POA: Diagnosis present

## 2023-12-25 DIAGNOSIS — Z91040 Latex allergy status: Secondary | ICD-10-CM | POA: Diagnosis not present

## 2023-12-25 DIAGNOSIS — Z881 Allergy status to other antibiotic agents status: Secondary | ICD-10-CM

## 2023-12-25 DIAGNOSIS — I129 Hypertensive chronic kidney disease with stage 1 through stage 4 chronic kidney disease, or unspecified chronic kidney disease: Secondary | ICD-10-CM | POA: Diagnosis not present

## 2023-12-25 DIAGNOSIS — Z800 Family history of malignant neoplasm of digestive organs: Secondary | ICD-10-CM | POA: Diagnosis not present

## 2023-12-25 DIAGNOSIS — Z91018 Allergy to other foods: Secondary | ICD-10-CM

## 2023-12-25 DIAGNOSIS — F419 Anxiety disorder, unspecified: Secondary | ICD-10-CM | POA: Diagnosis not present

## 2023-12-25 DIAGNOSIS — F418 Other specified anxiety disorders: Secondary | ICD-10-CM | POA: Diagnosis not present

## 2023-12-25 DIAGNOSIS — M21371 Foot drop, right foot: Secondary | ICD-10-CM | POA: Diagnosis present

## 2023-12-25 DIAGNOSIS — Z808 Family history of malignant neoplasm of other organs or systems: Secondary | ICD-10-CM | POA: Diagnosis not present

## 2023-12-25 DIAGNOSIS — I5032 Chronic diastolic (congestive) heart failure: Secondary | ICD-10-CM | POA: Diagnosis not present

## 2023-12-25 DIAGNOSIS — S31000A Unspecified open wound of lower back and pelvis without penetration into retroperitoneum, initial encounter: Secondary | ICD-10-CM | POA: Diagnosis not present

## 2023-12-25 DIAGNOSIS — I130 Hypertensive heart and chronic kidney disease with heart failure and stage 1 through stage 4 chronic kidney disease, or unspecified chronic kidney disease: Secondary | ICD-10-CM

## 2023-12-25 HISTORY — PX: LUMBAR WOUND DEBRIDEMENT: SHX1988

## 2023-12-25 LAB — PROTIME-INR
INR: 1.2 (ref 0.8–1.2)
Prothrombin Time: 15 s (ref 11.4–15.2)

## 2023-12-25 SURGERY — LUMBAR WOUND DEBRIDEMENT
Anesthesia: General | Site: Back

## 2023-12-25 MED ORDER — SODIUM CHLORIDE 0.9% FLUSH: 3.0000 mL | Freq: Two times a day (BID) | INTRAVENOUS | Status: DC

## 2023-12-25 MED ORDER — CHLORHEXIDINE GLUCONATE CLOTH 2 % EX PADS: 6.0000 | MEDICATED_PAD | Freq: Once | CUTANEOUS | Status: DC

## 2023-12-25 MED ORDER — FENTANYL CITRATE (PF) 100 MCG/2ML IJ SOLN
25.0000 ug | INTRAMUSCULAR | Status: DC | PRN
  Administered 2023-12-25 (×3): 50 ug via INTRAVENOUS

## 2023-12-25 MED ORDER — ACETAMINOPHEN 325 MG PO TABS: 650.0000 mg | ORAL_TABLET | ORAL | Status: DC | PRN

## 2023-12-25 MED ORDER — ONDANSETRON HCL 4 MG PO TABS: 4.0000 mg | ORAL_TABLET | Freq: Four times a day (QID) | ORAL | Status: DC | PRN

## 2023-12-25 MED ORDER — VANCOMYCIN HCL IN DEXTROSE 1-5 GM/200ML-% IV SOLN
1000.0000 mg | INTRAVENOUS | Status: AC
  Administered 2023-12-25: 1000 mg via INTRAVENOUS
  Filled 2023-12-25: qty 200

## 2023-12-25 MED ORDER — MIDAZOLAM HCL 2 MG/2ML IJ SOLN
INTRAMUSCULAR | Status: DC | PRN
  Administered 2023-12-25: 2 mg via INTRAVENOUS

## 2023-12-25 MED ORDER — MIDAZOLAM HCL 2 MG/2ML IJ SOLN
INTRAMUSCULAR | Status: AC
  Filled 2023-12-25: qty 2

## 2023-12-25 MED ORDER — LABETALOL HCL 5 MG/ML IV SOLN
INTRAVENOUS | Status: DC | PRN
Start: 2023-12-25 — End: 2023-12-25
  Administered 2023-12-25: 10 mg via INTRAVENOUS

## 2023-12-25 MED ORDER — METHOCARBAMOL 500 MG PO TABS
500.0000 mg | ORAL_TABLET | Freq: Three times a day (TID) | ORAL | Status: DC | PRN
  Administered 2023-12-25 – 2023-12-26 (×2): 500 mg via ORAL
  Filled 2023-12-25 (×2): qty 1

## 2023-12-25 MED ORDER — DEXMEDETOMIDINE HCL IN NACL 80 MCG/20ML IV SOLN
INTRAVENOUS | Status: DC | PRN
  Administered 2023-12-25 (×2): 8 ug via INTRAVENOUS
  Administered 2023-12-25: 4 ug via INTRAVENOUS

## 2023-12-25 MED ORDER — BUPIVACAINE HCL (PF) 0.25 % IJ SOLN
INTRAMUSCULAR | Status: AC
  Filled 2023-12-25: qty 30

## 2023-12-25 MED ORDER — LIDOCAINE 2% (20 MG/ML) 5 ML SYRINGE
INTRAMUSCULAR | Status: DC | PRN
  Administered 2023-12-25: 60 mg via INTRAVENOUS

## 2023-12-25 MED ORDER — SUGAMMADEX SODIUM 200 MG/2ML IV SOLN
INTRAVENOUS | Status: AC
  Filled 2023-12-25: qty 2

## 2023-12-25 MED ORDER — FENTANYL CITRATE (PF) 250 MCG/5ML IJ SOLN
INTRAMUSCULAR | Status: AC
  Filled 2023-12-25: qty 5

## 2023-12-25 MED ORDER — FENTANYL CITRATE (PF) 100 MCG/2ML IJ SOLN
100.0000 ug | Freq: Once | INTRAMUSCULAR | Status: AC
  Administered 2023-12-25: 100 ug via INTRAVENOUS

## 2023-12-25 MED ORDER — ONDANSETRON HCL 4 MG/2ML IJ SOLN
INTRAMUSCULAR | Status: DC | PRN
  Administered 2023-12-25: 4 mg via INTRAVENOUS

## 2023-12-25 MED ORDER — LIDOCAINE 2% (20 MG/ML) 5 ML SYRINGE
INTRAMUSCULAR | Status: AC
  Filled 2023-12-25: qty 5

## 2023-12-25 MED ORDER — FENTANYL CITRATE (PF) 250 MCG/5ML IJ SOLN
INTRAMUSCULAR | Status: DC | PRN
  Administered 2023-12-25 (×3): 50 ug via INTRAVENOUS
  Administered 2023-12-25: 100 ug via INTRAVENOUS

## 2023-12-25 MED ORDER — SPIRONOLACTONE 25 MG PO TABS
50.0000 mg | ORAL_TABLET | Freq: Every day | ORAL | Status: DC
  Administered 2023-12-25: 50 mg via ORAL
  Filled 2023-12-25: qty 2

## 2023-12-25 MED ORDER — PHENOL 1.4 % MT LIQD: 1.0000 | OROMUCOSAL | Status: DC | PRN

## 2023-12-25 MED ORDER — ONDANSETRON HCL 4 MG/2ML IJ SOLN: 4.0000 mg | Freq: Once | INTRAMUSCULAR | Status: DC | PRN

## 2023-12-25 MED ORDER — ACETAMINOPHEN 650 MG RE SUPP: 650.0000 mg | RECTAL | Status: DC | PRN

## 2023-12-25 MED ORDER — GABAPENTIN 300 MG PO CAPS
600.0000 mg | ORAL_CAPSULE | Freq: Three times a day (TID) | ORAL | Status: DC
  Administered 2023-12-25: 600 mg via ORAL
  Filled 2023-12-25: qty 2

## 2023-12-25 MED ORDER — LACTATED RINGERS IV SOLN: INTRAVENOUS | Status: DC

## 2023-12-25 MED ORDER — OXYCODONE HCL 5 MG PO TABS: 5.0000 mg | ORAL_TABLET | Freq: Once | ORAL | Status: DC | PRN

## 2023-12-25 MED ORDER — CHLORHEXIDINE GLUCONATE 0.12 % MT SOLN
15.0000 mL | Freq: Once | OROMUCOSAL | Status: AC
  Administered 2023-12-25: 15 mL via OROMUCOSAL
  Filled 2023-12-25: qty 15

## 2023-12-25 MED ORDER — SODIUM CHLORIDE 0.9 % IV SOLN: 250.0000 mL | INTRAVENOUS | Status: DC

## 2023-12-25 MED ORDER — AMLODIPINE BESYLATE 5 MG PO TABS
5.0000 mg | ORAL_TABLET | Freq: Every day | ORAL | Status: DC
  Administered 2023-12-25: 5 mg via ORAL
  Filled 2023-12-25: qty 1

## 2023-12-25 MED ORDER — KETOROLAC TROMETHAMINE 30 MG/ML IJ SOLN: 30.0000 mg | Freq: Once | INTRAMUSCULAR | Status: AC

## 2023-12-25 MED ORDER — VANCOMYCIN HCL IN DEXTROSE 1-5 GM/200ML-% IV SOLN
1000.0000 mg | Freq: Once | INTRAVENOUS | Status: AC
  Administered 2023-12-25: 1000 mg via INTRAVENOUS
  Filled 2023-12-25: qty 200

## 2023-12-25 MED ORDER — SENNA 8.6 MG PO TABS
1.0000 | ORAL_TABLET | Freq: Two times a day (BID) | ORAL | Status: DC
  Filled 2023-12-25: qty 1

## 2023-12-25 MED ORDER — PROPOFOL 10 MG/ML IV BOLUS
INTRAVENOUS | Status: DC | PRN
  Administered 2023-12-25: 150 mg via INTRAVENOUS

## 2023-12-25 MED ORDER — FENTANYL CITRATE (PF) 100 MCG/2ML IJ SOLN
INTRAMUSCULAR | Status: AC
  Filled 2023-12-25: qty 2

## 2023-12-25 MED ORDER — ROCURONIUM BROMIDE 10 MG/ML (PF) SYRINGE
PREFILLED_SYRINGE | INTRAVENOUS | Status: DC | PRN
  Administered 2023-12-25: 60 mg via INTRAVENOUS

## 2023-12-25 MED ORDER — HYDRALAZINE HCL 50 MG PO TABS
50.0000 mg | ORAL_TABLET | Freq: Two times a day (BID) | ORAL | Status: DC
Start: 2023-12-25 — End: 2023-12-26
  Administered 2023-12-25: 50 mg via ORAL
  Filled 2023-12-25: qty 1

## 2023-12-25 MED ORDER — OXYCODONE HCL 5 MG/5ML PO SOLN: 5.0000 mg | Freq: Once | ORAL | Status: DC | PRN

## 2023-12-25 MED ORDER — ONDANSETRON HCL 4 MG/2ML IJ SOLN
4.0000 mg | Freq: Four times a day (QID) | INTRAMUSCULAR | Status: DC | PRN
  Administered 2023-12-26: 4 mg via INTRAVENOUS
  Filled 2023-12-25: qty 2

## 2023-12-25 MED ORDER — BUPIVACAINE HCL (PF) 0.25 % IJ SOLN
INTRAMUSCULAR | Status: DC | PRN
  Administered 2023-12-25: 20 mL

## 2023-12-25 MED ORDER — SODIUM CHLORIDE 0.9% FLUSH: 3.0000 mL | INTRAVENOUS | Status: DC | PRN

## 2023-12-25 MED ORDER — ROCURONIUM BROMIDE 10 MG/ML (PF) SYRINGE
PREFILLED_SYRINGE | INTRAVENOUS | Status: AC
  Filled 2023-12-25: qty 10

## 2023-12-25 MED ORDER — PROPOFOL 10 MG/ML IV BOLUS
INTRAVENOUS | Status: AC
  Filled 2023-12-25: qty 20

## 2023-12-25 MED ORDER — ACETAMINOPHEN 500 MG PO TABS
1000.0000 mg | ORAL_TABLET | Freq: Once | ORAL | Status: AC
  Administered 2023-12-25: 1000 mg via ORAL
  Filled 2023-12-25: qty 2

## 2023-12-25 MED ORDER — CARVEDILOL 25 MG PO TABS: 25.0000 mg | ORAL_TABLET | Freq: Two times a day (BID) | ORAL | Status: DC

## 2023-12-25 MED ORDER — MORPHINE SULFATE (PF) 2 MG/ML IV SOLN
2.0000 mg | INTRAVENOUS | Status: DC | PRN
  Administered 2023-12-25: 2 mg via INTRAVENOUS
  Filled 2023-12-25: qty 1

## 2023-12-25 MED ORDER — ORAL CARE MOUTH RINSE: 15.0000 mL | Freq: Once | OROMUCOSAL | Status: AC

## 2023-12-25 MED ORDER — DULOXETINE HCL 30 MG PO CPEP: 30.0000 mg | ORAL_CAPSULE | Freq: Every day | ORAL | Status: DC | PRN

## 2023-12-25 MED ORDER — MENTHOL 3 MG MT LOZG: 1.0000 | LOZENGE | OROMUCOSAL | Status: DC | PRN

## 2023-12-25 MED ORDER — SUGAMMADEX SODIUM 200 MG/2ML IV SOLN
INTRAVENOUS | Status: DC | PRN
  Administered 2023-12-25: 400 mg via INTRAVENOUS

## 2023-12-25 MED ORDER — DOXYCYCLINE HYCLATE 100 MG PO TABS
100.0000 mg | ORAL_TABLET | Freq: Two times a day (BID) | ORAL | Status: DC
Start: 2023-12-25 — End: 2023-12-26
  Administered 2023-12-25: 100 mg via ORAL
  Filled 2023-12-25: qty 1

## 2023-12-25 MED ORDER — AMITRIPTYLINE HCL 25 MG PO TABS
25.0000 mg | ORAL_TABLET | Freq: Every day | ORAL | Status: DC
  Administered 2023-12-25: 25 mg via ORAL
  Filled 2023-12-25 (×2): qty 1

## 2023-12-25 MED ORDER — VANCOMYCIN HCL 1000 MG IV SOLR
INTRAVENOUS | Status: DC | PRN
  Administered 2023-12-25: 1000 mg via INTRAVENOUS

## 2023-12-25 MED ORDER — TRAMADOL HCL 50 MG PO TABS
50.0000 mg | ORAL_TABLET | Freq: Four times a day (QID) | ORAL | Status: DC | PRN
  Administered 2023-12-25 – 2023-12-26 (×3): 100 mg via ORAL
  Filled 2023-12-25 (×3): qty 2

## 2023-12-25 MED ORDER — DEXAMETHASONE SODIUM PHOSPHATE 10 MG/ML IJ SOLN
INTRAMUSCULAR | Status: DC | PRN
  Administered 2023-12-25: 10 mg via INTRAVENOUS

## 2023-12-25 MED ORDER — KETOROLAC TROMETHAMINE 30 MG/ML IJ SOLN
INTRAMUSCULAR | Status: AC
Start: 2023-12-25 — End: 2023-12-25
  Administered 2023-12-25: 30 mg
  Filled 2023-12-25: qty 1

## 2023-12-25 SURGICAL SUPPLY — 41 items
BAG COUNTER SPONGE SURGICOUNT (BAG) ×2 IMPLANT
BENZOIN TINCTURE PRP APPL 2/3 (GAUZE/BANDAGES/DRESSINGS) ×2 IMPLANT
CANISTER SUCT 3000ML PPV (MISCELLANEOUS) ×2 IMPLANT
CLEANSER WND VASHE INSTL 34OZ (WOUND CARE) IMPLANT
DRAPE LAPAROTOMY 100X72X124 (DRAPES) ×2 IMPLANT
DRESSING MORCELLS FINE 1000 (Tissue) IMPLANT
DRESSING PEEL AND PLC PRVNA 13 (GAUZE/BANDAGES/DRESSINGS) IMPLANT
DRSG PEEL AND PLACE PREVENA 13 (GAUZE/BANDAGES/DRESSINGS) ×1 IMPLANT
DURAPREP 26ML APPLICATOR (WOUND CARE) IMPLANT
DURAPREP 6ML APPLICATOR 50/CS (WOUND CARE) IMPLANT
ELECT REM PT RETURN 9FT ADLT (ELECTROSURGICAL) ×1 IMPLANT
ELECTRODE REM PT RTRN 9FT ADLT (ELECTROSURGICAL) ×2 IMPLANT
GAUZE 4X4 16PLY ~~LOC~~+RFID DBL (SPONGE) IMPLANT
GLOVE BIO SURGEON STRL SZ7 (GLOVE) IMPLANT
GLOVE BIO SURGEON STRL SZ8 (GLOVE) ×2 IMPLANT
GLOVE BIOGEL PI IND STRL 7.0 (GLOVE) IMPLANT
GOWN STRL REUS W/ TWL LRG LVL3 (GOWN DISPOSABLE) IMPLANT
GOWN STRL REUS W/ TWL XL LVL3 (GOWN DISPOSABLE) IMPLANT
GOWN STRL REUS W/TWL 2XL LVL3 (GOWN DISPOSABLE) ×2 IMPLANT
KIT BASIN OR (CUSTOM PROCEDURE TRAY) ×2 IMPLANT
KIT DRSG PREVENA PLUS 7DAY 125 (MISCELLANEOUS) IMPLANT
KIT TURNOVER KIT B (KITS) ×2 IMPLANT
NDL HYPO 18GX1.5 BLUNT FILL (NEEDLE) IMPLANT
NDL HYPO 25X1 1.5 SAFETY (NEEDLE) ×2 IMPLANT
NDL SPNL 20GX3.5 QUINCKE YW (NEEDLE) IMPLANT
NEEDLE HYPO 18GX1.5 BLUNT FILL (NEEDLE) IMPLANT
NEEDLE HYPO 25X1 1.5 SAFETY (NEEDLE) ×1 IMPLANT
NEEDLE SPNL 20GX3.5 QUINCKE YW (NEEDLE) IMPLANT
NS IRRIG 1000ML POUR BTL (IV SOLUTION) ×2 IMPLANT
PACK LAMINECTOMY NEURO (CUSTOM PROCEDURE TRAY) ×2 IMPLANT
PAD ARMBOARD 7.5X6 YLW CONV (MISCELLANEOUS) ×6 IMPLANT
STRIP CLOSURE SKIN 1/2X4 (GAUZE/BANDAGES/DRESSINGS) ×2 IMPLANT
SUT VIC AB 0 CT1 18XCR BRD8 (SUTURE) ×2 IMPLANT
SUT VIC AB 2-0 CP2 18 (SUTURE) ×2 IMPLANT
SUT VIC AB 3-0 SH 8-18 (SUTURE) ×2 IMPLANT
SWAB COLLECTION DEVICE MRSA (MISCELLANEOUS) ×2 IMPLANT
SWAB CULTURE ESWAB REG 1ML (MISCELLANEOUS) ×2 IMPLANT
SYR 3ML LL SCALE MARK (SYRINGE) IMPLANT
TOWEL GREEN STERILE (TOWEL DISPOSABLE) ×2 IMPLANT
TOWEL GREEN STERILE FF (TOWEL DISPOSABLE) ×2 IMPLANT
WATER STERILE IRR 1000ML POUR (IV SOLUTION) ×2 IMPLANT

## 2023-12-25 NOTE — H&P (Signed)
 Subjective: Patient is a 63 y.o. female admitted for lumbar wound drainage after lumbar fusion surgery. Onset of symptoms was a few days ago, unchanged since that time.  The pain is rated moderate, and is located at the across the lower back. The pain is described as aching and occurs all day. The symptoms have been progressive. Symptoms are exacerbated by exercise.  Presented to the office and had drainage from the wound.  She was put on doxycycline.  We recommended irrigation and debridement of the wound with cultures.  Past Medical History:  Diagnosis Date   Anxiety    Aortic atherosclerosis (HCC) 04/16/2021   Atypical angina (HCC)    Back pain    related to spinal stenosis and disc problem, radiates down left buttocks to leg., weakness occ.   Chest pain    a. 03/2015 Cath: nl cors; b. 03/2021 Cor CTA: Ca2+ = 0. Nl Cors.   Chronic pain syndrome    Dyspnea    hx   GERD (gastroesophageal reflux disease)    Grade I diastolic dysfunction    Headache    Hyperlipidemia    Hypertension    Lumbar post-laminectomy syndrome    LVH (left ventricular hypertrophy) 12/15/2020   a. 11/2020 Echo: EF 65-70%, no rwma, sev asymm LVH with IVSd 1.9 cm. No LVOT obs @ rest. Gr1 DD. Triv MR.   PONV (postoperative nausea and vomiting)    Pulmonary nodules    a. 03/2021 CT Chest: 3mm pulm nodules in bilat lower lobes. F/u 1 yr.   Right foot drop    Syncope    a. 11/2020 Zio: No significant arrhythmias.   Vaginal foreign object    "Uses Femring"    Past Surgical History:  Procedure Laterality Date   ABDOMINAL HYSTERECTOMY     ANTERIOR CERVICAL DECOMP/DISCECTOMY FUSION N/A 01/03/2022   Procedure: Anterior Cervical Decompression Fusion - Cervical four-Cervical five - Cervical five-Cervical six;  Surgeon: Tia Alert, MD;  Location: [facility] OR;  Service: Neurosurgery;  Laterality: N/A;   BIOPSY  12/16/2020   Procedure: BIOPSY;  Surgeon: Meryl Dare, MD;  Location: [facility] ENDOSCOPY;  Service: Endoscopy;;    CARDIAC CATHETERIZATION N/A 04/18/2015   Procedure: Left Heart Cath and Coronary Angiography;  Surgeon: Rinaldo Cloud, MD;  Location: [facility] INVASIVE CV LAB;  Service: Cardiovascular;  Laterality: N/A;   COLONOSCOPY     COLONOSCOPY W/ BIOPSIES AND POLYPECTOMY  2018   ESOPHAGOGASTRODUODENOSCOPY (EGD) WITH PROPOFOL N/A 12/16/2020   Procedure: ESOPHAGOGASTRODUODENOSCOPY (EGD) WITH PROPOFOL;  Surgeon: Meryl Dare, MD;  Location: [facility] ENDOSCOPY;  Service: Endoscopy;  Laterality: N/A;   FOOT SURGERY Bilateral    [facility] "bunion,bone spur, tendon" (1) -6'16, (1)-10'16   HEMATOMA EVACUATION N/A 01/05/2022   Procedure: Cervical Wound Exploration;  Surgeon: Coletta Memos, MD;  Location: [facility] OR;  Service: Neurosurgery;  Laterality: N/A;   IR EPIDUROGRAPHY  07/21/2018   LUMBAR LAMINECTOMY/DECOMPRESSION MICRODISCECTOMY Bilateral 12/28/2015   Procedure: MICRO LUMBAR DECOMPRESSION L4 - L5 BILATERALLY;  Surgeon: Jene Every, MD;  Location: [facility] ORS;  Service: Orthopedics;  Laterality: Bilateral;   LUMBAR LAMINECTOMY/DECOMPRESSION MICRODISCECTOMY Bilateral 03/04/2018   Procedure: Revision of Microlumbar Decompression Bilateral Lumbar Four-Five;  Surgeon: Jene Every, MD;  Location: [facility] OR;  Service: Orthopedics;  Laterality: Bilateral;  90 mins   POSTERIOR FUSION PEDICLE SCREW PLACEMENT N/A 12/07/2023   Procedure: LUMBAR FOUR-FIVE POSTERIOR LATERAL FUSION;  Surgeon: Arman Bogus, MD;  Location: [facility] OR;  Service: Neurosurgery;  Laterality: N/A;   SAVORY DILATION  N/A 12/16/2020   Procedure: SAVORY DILATION;  Surgeon: Meryl Dare, MD;  Location: [facility] ENDOSCOPY;  Service: Endoscopy;  Laterality: N/A;   SPINAL CORD STIMULATOR INSERTION N/A 09/28/2019   Procedure: THORACIC SPINAL CORD STIMULATOR INSERTION;  Surgeon: Venita Lick, MD;  Location: [facility] OR;  Service: Orthopedics;  Laterality: N/A;  2.5 hrs   SPINAL CORD STIMULATOR REMOVAL N/A 05/27/2021   Procedure: LUMBAR SPINAL CORD STIMULATOR REMOVAL;   Surgeon: Lucy Chris, MD;  Location: [facility] ORS;  Service: Neurosurgery;  Laterality: N/A;   TUBAL LIGATION     WISDOM TOOTH EXTRACTION     WOUND EXPLORATION N/A 03/04/2018   Procedure: EXPLORATION OF LUMBAR DECOMPRESSION WOUND;  Surgeon: Jene Every, MD;  Location: [facility] OR;  Service: Orthopedics;  Laterality: N/A;    Prior to Admission medications   Medication Sig Start Date End Date Taking? Authorizing Provider  acetaminophen (TYLENOL) 500 MG tablet Take 500 mg by mouth every 6 (six) hours as needed for headache.   Yes [provider]  ALPRAZolam (XANAX) 0.5 MG tablet Take 1/2 - 1 tablet by mouth at bedtime as needed for anxiety. 10/19/23  Yes [place], [name], MD  amitriptyline (ELAVIL) 25 MG tablet Take 1 tablet (25 mg total) by mouth daily. 08/13/23  Yes   amLODipine (NORVASC) 5 MG tablet Take 1 tablet (5 mg total) by mouth daily. 06/10/23 06/04/24 Yes Hilty, Lisette Abu, MD  atorvastatin (LIPITOR) 20 MG tablet Take 1 tablet (20 mg total) by mouth daily. 11/10/23  Yes [place], [name], MD  baclofen (LIORESAL) 10 MG tablet Take 1 tablet (10 mg total) by mouth 3 (three) times daily as needed. 10/13/23  Yes Ranelle Oyster, MD  betamethasone dipropionate 0.05 % cream Apply as directed to skin once a day to itchy areas only avoiding normal skin as needed 12/02/23  Yes   carvedilol (COREG) 25 MG tablet Take 1 tablet (25 mg total) by mouth 2 (two) times daily with a meal. Patient taking differently: Take 50 mg by mouth 2 (two) times daily with a meal. 08/06/22 12/25/23 Yes Alver Sorrow, NP  cholecalciferol (VITAMIN D3) 25 MCG (1000 UNIT) tablet Take 1,000 Units by mouth daily.   Yes [provider]  diphenhydrAMINE (BENADRYL) 25 MG tablet Take 25 mg by mouth daily as needed for itching.   Yes [provider]  docusate sodium (COLACE) 100 MG capsule Take 100 mg by mouth 2 (two) times daily.   Yes [provider]  doxycycline (ADOXA) 100 MG tablet Take 1 tablet (100  mg total) by mouth every 12 (twelve) hours. 12/22/23  Yes   Estradiol Acetate (FEMRING) 0.05 MG/24HR RING USE ONE RING VAGINALLY EVERY 3 MONTHS AS DIRECTED 03/16/23  Yes   gabapentin (NEURONTIN) 600 MG tablet Take 1 tablet (600 mg total) by mouth 3 (three) times daily. 09/11/23  Yes Ranelle Oyster, MD  hydrALAZINE (APRESOLINE) 50 MG tablet Take 1 tablet (50 mg total) by mouth 3 (three) times daily. Patient taking differently: Take 50 mg by mouth 2 (two) times daily. 11/12/23 02/11/24 Yes West, Katlyn D, NP  methocarbamol (ROBAXIN) 500 MG tablet Take 1 tablet (500 mg total) by mouth 3 (three) times daily as needed for muscle spasm. 12/11/23  Yes Meyran, Tiana Loft, NP  pregabalin (LYRICA) 300 MG capsule Take 1 capsule (300 mg total) by mouth 2 (two) times daily. 08/07/23 02/03/24 Yes Windell Norfolk, MD  spironolactone (ALDACTONE) 50 MG tablet Take 1 tablet (50 mg total) by mouth daily. 12/23/22  12/25/23 Yes Hilty, Lisette Abu, MD  traMADol (ULTRAM) 50 MG tablet Take 1-2 tablets (50-100 mg total) by mouth every 6 (six) hours as needed for severe pain (pain score 7-10) or moderate pain (pain score 4-6). 12/11/23  Yes Meyran, Tiana Loft, NP  DULoxetine (CYMBALTA) 30 MG capsule Take 2 capsules (60 mg total) by mouth daily. Patient taking differently: Take 30 mg by mouth daily as needed (pain). 09/30/23   Ranelle Oyster, MD  fluconazole (DIFLUCAN) 200 MG tablet Take 1 tablet by mouth once a week Patient not taking: Reported on 12/22/2023 11/02/23     ondansetron (ZOFRAN) 4 MG tablet Take 1 tablet (4 mg tablet) 30 - 60 minutes each prep dose 09/01/22   Sherrilyn Rist, MD  terbinafine (LAMISIL) 1 % cream Apply 1 Application topically 2 (two) times daily as needed (rash).    [provider]  omeprazole (PRILOSEC) 40 MG capsule Take 1 capsule (40 mg total) by mouth daily. 04/14/23 09/05/23  [place], [name], MD   Allergies  Allergen Reactions   Cephalosporins Anaphylaxis   Penicillins Anaphylaxis  and Hives   Anesthetics, Amide Nausea And Vomiting    Does not know name of it. [facility].    Betadine [Povidone Iodine] Other (See Comments)    "Skin Burn" caused scar on Left buttock   Latex Hives, Itching, Rash and Other (See Comments)   Peach [Prunus Persica] Hives   Claritin [Loratadine]     pruritis    Hydrocodone     Altered mental state, feel spaced out     Social History   Tobacco Use   Smoking status: Never   Smokeless tobacco: Never  Substance Use Topics   Alcohol use: Never    Family History  Problem Relation Age of Onset   Heart attack Mother    Lung cancer Father    Cancer Father    Pancreatic cancer Sister    Breast cancer Sister 4   Multiple myeloma Sister    Breast cancer Sister        diagnosed in her 45's   Heart attack Sister    Throat cancer Brother    Lung cancer Brother    Stomach cancer Cousin    Colon cancer Neg Hx    Colon polyps Neg Hx    Esophageal cancer Neg Hx    Rectal cancer Neg Hx      Review of Systems  Positive ROS: neg  All other systems have been reviewed and were otherwise negative with the exception of those mentioned in the HPI and as above.  Objective: Vital signs in last 24 hours: Temp:  [98.6 F (37 C)] 98.6 F (37 C) (02/28 1328) Pulse Rate:  [96] 96 (02/28 1328) Resp:  [18] 18 (02/28 1328) BP: (132)/(74) 132/74 (02/28 1328) SpO2:  [99 %] 99 % (02/28 1328) Weight:  [76.7 kg] 76.7 kg (02/28 1328)  General Appearance: Alert, cooperative, no distress, appears stated age Head: Normocephalic, without obvious abnormality, atraumatic Eyes: PERRL, conjunctiva/corneas clear, EOM's intact    Neck: Supple, symmetrical, trachea midline Back: Symmetric, no curvature, ROM normal, no CVA tenderness Lungs:  respirations unlabored Heart: Regular rate and rhythm Abdomen: Soft, non-tender Extremities: Extremities normal, atraumatic, no cyanosis or edema Pulses: 2+ and symmetric all  extremities Skin: Skin color, texture, turgor normal, no rashes or lesions  NEUROLOGIC:   Mental status: Alert and oriented x4,  no aphasia, good attention span, fund of knowledge,  and memory Motor Exam - grossly normal except old foot drop Sensory Exam - grossly normal Reflexes: 1+ Coordination - grossly normal Gait - grossly normal Balance - grossly normal Cranial Nerves: I: smell Not tested  II: visual acuity  OS: nl    OD: nl  II: visual fields Full to confrontation  II: pupils Equal, round, reactive to light  III,VII: ptosis None  III,IV,VI: extraocular muscles  Full ROM  V: mastication Normal  V: facial light touch sensation  Normal  V,VII: corneal reflex  Present  VII: facial muscle function - upper  Normal  VII: facial muscle function - lower Normal  VIII: hearing Not tested  IX: soft palate elevation  Normal  IX,X: gag reflex Present  XI: trapezius strength  5/5  XI: sternocleidomastoid strength 5/5  XI: neck flexion strength  5/5  XII: tongue strength  Normal    Data Review Lab Results  Component Value Date   WBC 5.7 12/01/2023   HGB 13.4 12/01/2023   HCT 41.8 12/01/2023   MCV 93.5 12/01/2023   PLT 303 12/01/2023   Lab Results  Component Value Date   NA 139 12/01/2023   K 4.0 12/01/2023   CL 103 12/01/2023   CO2 29 12/01/2023   BUN 10 12/01/2023   CREATININE 1.24 (H) 12/01/2023   GLUCOSE 85 12/01/2023   Lab Results  Component Value Date   INR 1.0 12/01/2023    Assessment/Plan:  Estimated body mass index is 28.12 kg/m as calculated from the following:   Height as of this encounter: 5\' 5"  (1.651 m).   Weight as of this encounter: 76.7 kg. Patient admitted for irrigation debridement of lumbar wound. Patient has failed a reasonable attempt at conservative therapy.  I explained the condition and procedure to the patient and answered any questions.  Patient wishes to proceed with procedure as planned. Understands risks/ benefits and typical  outcomes of procedure.   Tia Alert 12/25/2023 3:59 PM

## 2023-12-25 NOTE — Op Note (Signed)
 12/25/2023  5:15 PM  PATIENT:  [name]  63 y.o. female  PRE-OPERATIVE DIAGNOSIS: Lumbar wound drainage  POST-OPERATIVE DIAGNOSIS:  same  PROCEDURE: Irrigation and debridement of lumbar wound measuring 5 inches, 2 inches deep.  This meant of 1 g of fine myriad morsels and placement of external negative pressure dressing  SURGEON:  Marikay Alar, MD  ASSISTANTS: Verlin Dike, FNP  ANESTHESIA:   General  EBL: 50 ml  Total I/O In: 750 [I.V.:500; IV Piggyback:250] Out: 50 [Blood:50]  BLOOD ADMINISTERED: none  DRAINS: none  SPECIMEN:  none  INDICATION FOR PROCEDURE: This patient presented with lumbar wound drainage.. The patient tried conservative measures without relief. Pain was debilitating. Recommended irrigation and debridement of lumbar wound with cultures and primary closure. Patient understood the risks, benefits, and alternatives and potential outcomes and wished to proceed.  PROCEDURE DETAILS: The patient was taken operating room and after induction of adequate generalized endotracheal anesthesia she was rolled into the prone position on the Wilson frame and all pressure points were padded.  Her lumbar region was cleaned and then prepped with Hibiclens and then draped in usual sterile fashion.  Her incision had minimal drainage, minimal erythema but some induration.  We ellipsed out the old incision and carried this down to the fascia.  Was no evidence of pus or infection.  The wound and the tissues were clean.  We did send intraoperative cultures.  We then washed out with Vashe solution.  We placed 1 g of fine myriad morsels into the wound to help with wound healing.  We then closed the subcutaneous tissues with 2-0 Vicryl in the subcuticular tissues with 3-0 Vicryl.  We placed a Prevena external negative pressure dressing.  She was then awakened from general anesthesia and transferred to the recovery room in stable condition.  At the end of the procedure all  sponge needle and instrument counts were correct.   PLAN OF CARE: Discharge to home after PACU  PATIENT DISPOSITION:  PACU - hemodynamically stable.   Delay start of Pharmacological VTE agent (>24hrs) due to surgical blood loss or risk of bleeding:  yes

## 2023-12-25 NOTE — Anesthesia Preprocedure Evaluation (Addendum)
 Anesthesia Evaluation  Patient identified by MRN, date of birth, ID band Patient awake    Reviewed: Allergy & Precautions, NPO status , Patient's Chart, lab work & pertinent test results, reviewed documented beta blocker date and time   History of Anesthesia Complications (+) PONV and history of anesthetic complications  Airway Mallampati: II  TM Distance: >3 FB Neck ROM: Full    Dental  (+) Dental Advisory Given, Teeth Intact   Pulmonary    Pulmonary exam normal        Cardiovascular hypertension, Pt. on medications and Pt. on home beta blockers Normal cardiovascular exam+ dysrhythmias    '23 TTE - EF 60 to 65%. There is moderate asymmetric left ventricular hypertrophy of the basal-septal segment. Trivial mitral valve regurgitation.     Neuro/Psych  Headaches PSYCHIATRIC DISORDERS Anxiety Depression     Neuromuscular disease    GI/Hepatic Neg liver ROS,GERD  Controlled,,  Endo/Other  negative endocrine ROS    Renal/GU CRFRenal disease     Musculoskeletal  (+) Arthritis ,    Abdominal   Peds  Hematology negative hematology ROS (+)   Anesthesia Other Findings Chronic pain Right foot drop   Reproductive/Obstetrics                              Anesthesia Physical Anesthesia Plan  ASA: 3  Anesthesia Plan: General   Post-op Pain Management: Tylenol PO (pre-op)*   Induction: Intravenous  PONV Risk Score and Plan: 4 or greater and Ondansetron, Dexamethasone, Midazolam and Treatment may vary due to age or medical condition  Airway Management Planned: Oral ETT and Video Laryngoscope Planned  Additional Equipment: None  Intra-op Plan:   Post-operative Plan: Extubation in OR  Informed Consent: I have reviewed the patients History and Physical, chart, labs and discussed the procedure including the risks, benefits and alternatives for the proposed anesthesia with the patient or  authorized representative who has indicated his/her understanding and acceptance.     Dental advisory given  Plan Discussed with: CRNA and Anesthesiologist  Anesthesia Plan Comments: (PAT note by Antionette Poles, PA-C for previous recent back surgery)         Anesthesia Quick Evaluation

## 2023-12-25 NOTE — Anesthesia Procedure Notes (Signed)
 Procedure Name: Intubation Date/Time: 12/25/2023 4:33 PM  Performed by: Venia Carbon, CRNAPre-anesthesia Checklist: Patient identified, Emergency Drugs available, Suction available, Patient being monitored and Timeout performed Patient Re-evaluated:Patient Re-evaluated prior to induction Oxygen Delivery Method: Circle system utilized Preoxygenation: Pre-oxygenation with 100% oxygen Induction Type: IV induction Ventilation: Mask ventilation without difficulty Laryngoscope Size: Glidescope and 3 Grade View: Grade I Tube size: 7.0 mm Number of attempts: 1 Airway Equipment and Method: Patient positioned with wedge pillow and Stylet Placement Confirmation: ETT inserted through vocal cords under direct vision, positive ETCO2, CO2 detector and breath sounds checked- equal and bilateral Secured at: 22 cm Tube secured with: Tape

## 2023-12-25 NOTE — Discharge Summary (Addendum)
 Physician Discharge Summary  Patient ID: [name] MRN: [id number] DOB/AGE: 02-11-1961 63 y.o.  Admit date: 12/25/2023 Discharge date: 12/26/2023  Admission Diagnoses: Lumbar wound drainage after surgery with poor healing    Discharge Diagnoses: Same   Discharged Condition: good  Hospital Course: The patient was admitted on 12/25/2023 and taken to the operating room where the patient underwent irrigation and debridement of lumbar wound with primary closure and placement of external wound VAC.[name] The patient tolerated the procedure well and was taken to the recovery room and then to the PACU in stable condition. The hospital course was routine. There were no complications. The wound remained clean dry and intact. Pt had appropriate back soreness. No complaints of leg pain or new N/T/W. The patient remained afebrile with stable vital signs, and tolerated a regular diet. The patient continued to increase activities, and pain was well controlled with oral pain medications.   Consults: None  Significant Diagnostic Studies:  Results for orders placed or performed during the hospital encounter of 12/25/23  Aerobic/Anaerobic Culture w Gram Stain (surgical/deep wound)   Collection Time: 12/25/23  4:56 PM   Specimen: Path Tissue  Result Value Ref Range   Specimen Description WOUND LOWER BACK    Special Requests NONE    Gram Stain      FEW WBC PRESENT,BOTH PMN AND MONONUCLEAR NO ORGANISMS SEEN Performed at [facility], [address] [place], [place] [address]    Culture PENDING    Report Status PENDING   Protime-INR   Collection Time: 12/25/23  8:12 PM  Result Value Ref Range   Prothrombin Time 15.0 11.4 - 15.2 seconds   INR 1.2 0.8 - 1.2   *Note: Due to a large number of results and/or encounters for the requested time period, some results have not been displayed. A complete set of results can be found in Results Review.    DG Lumbar Spine 2-3 Views Result Date:  12/07/2023 CLINICAL DATA:  Elective surgery. EXAM: LUMBAR SPINE - 2-3 VIEW COMPARISON:  None Available. FINDINGS: Two fluoroscopic spot views of the lumbar spine submitted from the operating room. Pedicle screws at L4 and L5. Fluoroscopy time 35 seconds. Dose 20.91 mGy. IMPRESSION: Procedural fluoroscopy during lumbar surgery. Electronically Signed   By: Narda Rutherford M.D.   On: 12/07/2023 12:07   DG C-Arm 1-60 Min-No Report Result Date: 12/07/2023 Fluoroscopy was utilized by the requesting physician.  No radiographic interpretation.   DG C-Arm 1-60 Min-No Report Result Date: 12/07/2023 Fluoroscopy was utilized by the requesting physician.  No radiographic interpretation.   VAS [place] ABI WITH/WO TBI Result Date: 12/04/2023  LOWER EXTREMITY DOPPLER STUDY Patient Name:  [name]  Date of Exam:   12/03/2023 Medical Rec #: [id number]                 Accession #:    [id number] Date of Birth: Oct 23, 1961                Patient Gender: F Patient Age:   61 years Exam Location:  [facility] Procedure:      VAS [place] ABI WITH/WO TBI Referring Phys: --------------------------------------------------------------------------------  Indications: Patient complains of bilateral calf nocturnal cramping, left >              right, over the last 3 months. She uses a cane for assistance due              to right drop foot since 2019. She is scheduled  for lumbar spinal              fusion next week. High Risk Factors: Hypertension, hyperlipidemia, no history of smoking. Other Factors: History of CKD stage 3a.  Comparison Study: None Performing Technologist: Carlos American RVT  Examination Guidelines: A complete evaluation includes at minimum, Doppler waveform signals and systolic blood pressure reading at the level of bilateral brachial, anterior tibial, and posterior tibial arteries, when vessel segments are accessible. Bilateral testing is considered an integral part of a complete examination. Photoelectric  Plethysmograph (PPG) waveforms and toe systolic pressure readings are included as required and additional duplex testing as needed. Limited examinations for reoccurring indications may be performed as noted.  ABI Findings: +---------+------------------+-----+---------+--------+ Right    Rt Pressure (mmHg)IndexWaveform Comment  +---------+------------------+-----+---------+--------+ Brachial 165                                      +---------+------------------+-----+---------+--------+ PTA      196               1.15 triphasic         +---------+------------------+-----+---------+--------+ DP       192               1.13 triphasic         +---------+------------------+-----+---------+--------+ Great Toe139               0.82 Normal            +---------+------------------+-----+---------+--------+ +---------+------------------+-----+---------+-------+ Left     Lt Pressure (mmHg)IndexWaveform Comment +---------+------------------+-----+---------+-------+ Brachial 170                                     +---------+------------------+-----+---------+-------+ PTA      196               1.15 triphasic        +---------+------------------+-----+---------+-------+ DP       171               1.01 triphasic        +---------+------------------+-----+---------+-------+ Great Toe144               0.85 Normal           +---------+------------------+-----+---------+-------+ +-------+-----------+-----------+------------+------------+ ABI/TBIToday's ABIToday's TBIPrevious ABIPrevious TBI +-------+-----------+-----------+------------+------------+ Right  1.15       .82                                 +-------+-----------+-----------+------------+------------+ Left   1.15       .85                                 +-------+-----------+-----------+------------+------------+   Summary: Right: Resting right ankle-brachial index is within normal range. The  right toe-brachial index is normal. Left: Resting left ankle-brachial index is within normal range. The left toe-brachial index is normal. *See table(s) above for measurements and observations.  Electronically signed by Julien Nordmann MD on 12/04/2023 at 3:39:58 PM.    Final     Antibiotics:  Anti-infectives (From admission, onward)    Start     Dose/Rate Route Frequency Ordered Stop   12/26/23 0600  vancomycin (VANCOCIN) IVPB 1000 mg/200 mL premix  1,000 mg 200 mL/hr over 60 Minutes Intravenous On call to O.R. 12/25/23 1309 12/25/23 1502   12/26/23 0000  vancomycin (VANCOCIN) IVPB 1000 mg/200 mL premix        1,000 mg 200 mL/hr over 60 Minutes Intravenous  Once 12/25/23 1855 12/26/23 0805   12/25/23 2200  doxycycline (VIBRA-TABS) tablet 100 mg        100 mg Oral Every 12 hours 12/25/23 1855         Discharge Exam: Blood pressure (!) 146/71, pulse (!) 116, temperature 97.7 F (36.5 C), temperature source Oral, resp. rate 20, height 5\' 5"  (1.651 m), weight 76.7 kg, SpO2 99%. Neurologic: Grossly normal except for old foot drop Wound VAC in place  Discharge Medications:   Allergies as of 12/26/2023       Reactions   Cephalosporins Anaphylaxis   Penicillins Anaphylaxis, Hives   Anesthetics, Amide Nausea And Vomiting   Does not know name of it. States they put it on record foot center.    Betadine [povidone Iodine] Other (See Comments)   "Skin Burn" caused scar on Left buttock   Latex Hives, Itching, Rash, Other (See Comments)   Peach [prunus Persica] Hives   Claritin [loratadine]    pruritis   Hydrocodone    Altered mental state, feel spaced out         Medication List     TAKE these medications    acetaminophen 500 MG tablet Commonly known as: TYLENOL Take 500 mg by mouth every 6 (six) hours as needed for headache.   ALPRAZolam 0.5 MG tablet Commonly known as: XANAX Take 1/2 - 1 tablet by mouth at bedtime as needed for anxiety.   amitriptyline 25 MG  tablet Commonly known as: ELAVIL Take 1 tablet (25 mg total) by mouth daily.   amLODipine 5 MG tablet Commonly known as: NORVASC Take 1 tablet (5 mg total) by mouth daily.   atorvastatin 20 MG tablet Commonly known as: LIPITOR Take 1 tablet (20 mg total) by mouth daily.   baclofen 10 MG tablet Commonly known as: LIORESAL Take 1 tablet (10 mg total) by mouth 3 (three) times daily as needed.   betamethasone dipropionate 0.05 % cream Apply as directed to skin once a day to itchy areas only avoiding normal skin as needed   carvedilol 25 MG tablet Commonly known as: COREG Take 1 tablet (25 mg total) by mouth 2 (two) times daily with a meal. What changed: how much to take   cholecalciferol 25 MCG (1000 UNIT) tablet Commonly known as: VITAMIN D3 Take 1,000 Units by mouth daily.   diphenhydrAMINE 25 MG tablet Commonly known as: BENADRYL Take 25 mg by mouth daily as needed for itching.   docusate sodium 100 MG capsule Commonly known as: COLACE Take 100 mg by mouth 2 (two) times daily.   doxycycline 100 MG tablet Commonly known as: ADOXA Take 1 tablet (100 mg total) by mouth every 12 (twelve) hours.   DULoxetine 30 MG capsule Commonly known as: Cymbalta Take 2 capsules (60 mg total) by mouth daily.   Femring 0.05 MG/24HR Ring Generic drug: Estradiol Acetate USE ONE RING VAGINALLY EVERY 3 MONTHS AS DIRECTED   fluconazole 200 MG tablet Commonly known as: DIFLUCAN Take 1 tablet by mouth once a week   gabapentin 600 MG tablet Commonly known as: NEURONTIN Take 1 tablet (600 mg total) by mouth 3 (three) times daily.   hydrALAZINE 50 MG tablet Commonly known as: APRESOLINE Take 1 tablet (50 mg  total) by mouth 3 (three) times daily. What changed: when to take this   methocarbamol 500 MG tablet Commonly known as: ROBAXIN Take 1 tablet (500 mg total) by mouth 3 (three) times daily as needed for muscle spasm.   ondansetron 4 MG tablet Commonly known as: ZOFRAN Take 1  tablet (4 mg tablet) 30 - 60 minutes each prep dose   pregabalin 300 MG capsule Commonly known as: LYRICA Take 1 capsule (300 mg total) by mouth 2 (two) times daily.   spironolactone 50 MG tablet Commonly known as: ALDACTONE Take 1 tablet (50 mg total) by mouth daily.   terbinafine 1 % cream Commonly known as: LAMISIL Apply 1 Application topically 2 (two) times daily as needed (rash).   traMADol 50 MG tablet Commonly known as: ULTRAM Take 1-2 tablets (50-100 mg total) by mouth every 6 (six) hours as needed for severe pain (pain score 7-10) or moderate pain (pain score 4-6).               Discharge Care Instructions  (From admission, onward)           Start     Ordered   12/26/23 0000  Discharge wound care:       Comments: Leave wound vac on until it dies on day 7, charge as needed for low battery   12/26/23 0814   2024/01/20 0000  Discharge wound care:       Comments: Charge the battery of the wound VAC as needed.  Remove it when it dies in about a week.   2024-01-20 1719            Disposition: Home   Final Dx: Lumbar wound revision with placement of external wound VAC  Discharge Instructions     Call MD for:  difficulty breathing, headache or visual disturbances   Complete by: As directed    Call MD for:  persistant nausea and vomiting   Complete by: As directed    Call MD for:  redness, tenderness, or signs of infection (pain, swelling, redness, odor or green/yellow discharge around incision site)   Complete by: As directed    Call MD for:  severe uncontrolled pain   Complete by: As directed    Call MD for:  temperature >100.4   Complete by: As directed    Diet - low sodium heart healthy   Complete by: As directed    Diet - low sodium heart healthy   Complete by: As directed    Discharge wound care:   Complete by: As directed    Charge the battery of the wound VAC as needed.  Remove it when it dies in about a week.   Discharge wound care:    Complete by: As directed    Leave wound vac on until it dies on day 7, charge as needed for low battery   Increase activity slowly   Complete by: As directed    Increase activity slowly   Complete by: As directed         Follow-up Information     Arman Bogus, MD. Schedule an appointment as soon as possible for a visit in 1 week(s).   Specialty: Neurosurgery Contact information: [address] [place] [address] [phone]                  Signed: Tiana Loft [facility] 12/26/2023, 8:15 AM

## 2023-12-25 NOTE — Transfer of Care (Signed)
 Immediate Anesthesia Transfer of Care Note  Patient: [name]  Procedure(s) Performed: Irrigation and debridement of lumbar wound (Back)  Patient Location: PACU  Anesthesia Type:General  Level of Consciousness: awake, alert , oriented, and patient cooperative  Airway & Oxygen Therapy: Patient Spontanous Breathing and Patient connected to nasal cannula oxygen  Post-op Assessment: Report given to RN, Post -op Vital signs reviewed and stable, Patient moving all extremities, Patient moving all extremities X 4, and Patient able to stick tongue midline  Post vital signs: Reviewed and stable  Last Vitals:  Vitals Value Taken Time  BP 163/80 12/25/23 1730  Temp    Pulse 84 12/25/23 1735  Resp 28 12/25/23 1735  SpO2 99 % 12/25/23 1735  Vitals shown include unfiled device data.  Last Pain:  Vitals:   12/25/23 1400  TempSrc:   PainSc: 5       Patients Stated Pain Goal: 3 (12/25/23 1352)  Complications: No notable events documented.

## 2023-12-26 ENCOUNTER — Other Ambulatory Visit (HOSPITAL_COMMUNITY): Payer: Self-pay

## 2023-12-26 MED ORDER — METHOCARBAMOL 500 MG PO TABS
500.0000 mg | ORAL_TABLET | Freq: Three times a day (TID) | ORAL | 1 refills | Status: DC | PRN
  Filled 2023-12-26: qty 30, 10d supply, fill #0

## 2023-12-26 MED ORDER — TRAMADOL HCL 50 MG PO TABS
50.0000 mg | ORAL_TABLET | Freq: Four times a day (QID) | ORAL | 0 refills | Status: DC | PRN
Start: 2023-12-26 — End: 2024-01-05
  Filled 2023-12-26: qty 30, 4d supply, fill #0

## 2023-12-26 NOTE — Anesthesia Postprocedure Evaluation (Signed)
 Anesthesia Post Note  Patient: [name]  Procedure(s) Performed: Irrigation and debridement of lumbar wound (Back)     Patient location during evaluation: PACU Anesthesia Type: General Level of consciousness: awake and alert Pain management: pain level controlled Vital Signs Assessment: post-procedure vital signs reviewed and stable Respiratory status: spontaneous breathing, nonlabored ventilation, respiratory function stable and patient connected to nasal cannula oxygen Cardiovascular status: blood pressure returned to baseline and stable Postop Assessment: no apparent nausea or vomiting Anesthetic complications: no   No notable events documented.  Last Vitals:  Vitals:   12/26/23 0806 12/26/23 0840  BP: (!) 146/71   Pulse: (!) 116 (!) 106  Resp: 20   Temp: 36.5 C   SpO2: 99% 97%    Last Pain:  Vitals:   12/26/23 0920  TempSrc:   PainSc: 5                  Kennieth Rad

## 2023-12-26 NOTE — Discharge Instructions (Signed)
Wound Care Keep incision covered and dry for two days.  Do not put any creams, lotions, or ointments on incision. Leave steri-strips on back.  They will fall off by themselves. Activity Walk each and every day, increasing distance each day. No lifting greater than 5 lbs.  Avoid excessive neck motion. No driving for 2 weeks; may ride as a passenger locally. If provided with back brace, wear when out of bed.  It is not necessary to wear brace in bed. Diet Resume your normal diet.  Return to Work Will be discussed at you follow up appointment. Call Your Doctor If Any of These Occur Redness, drainage, or swelling at the wound.  Temperature greater than 101 degrees. Severe pain not relieved by pain medication. Incision starts to come apart. Follow Up Appt Call today for appointment in 1-2 weeks ([phone]) or for problems.  If you have any hardware placed in your spine, you will need an x-ray before your appointment.

## 2023-12-26 NOTE — Progress Notes (Signed)
 Patient is discharged from room 3C10 at this time. Alert and in stable condition. IV d/c'd and Wound Vac in place. Instructions read to patient with understanding verbalized and all questions answered. Patient left unit via wheelchair with all belongings at side.

## 2023-12-28 ENCOUNTER — Encounter (HOSPITAL_COMMUNITY): Payer: Self-pay | Admitting: Neurological Surgery

## 2023-12-28 ENCOUNTER — Other Ambulatory Visit: Payer: Self-pay

## 2023-12-28 ENCOUNTER — Telehealth: Payer: Self-pay

## 2023-12-28 ENCOUNTER — Ambulatory Visit (HOSPITAL_BASED_OUTPATIENT_CLINIC_OR_DEPARTMENT_OTHER): Payer: PPO | Admitting: Internal Medicine

## 2023-12-28 NOTE — Transitions of Care (Post Inpatient/ED Visit) (Unsigned)
 12/28/2023  Name: [name] MRN: [id number] DOB: [birth year]  Today's TOC FU Call Status: Today's TOC FU Call Status:: Successful TOC FU Call Completed TOC FU Call Complete Date: 12/28/23 Patient's Name and Date of Birth confirmed.  Transition Care Management Follow-up Telephone Call Date of Discharge: 01/26/24 Type of Discharge: Inpatient Admission Primary Inpatient Discharge Diagnosis:: Lumbar wound revision with placement of external wound VAC How have you been since you were released from the hospital?: Same (reports being sore.  has a wound vac.) Any questions or concerns?: No  Items Reviewed: Did you receive and understand the discharge instructions provided?: Yes Medications obtained,verified, and reconciled?: Yes (Medications Reviewed) Any new allergies since your discharge?: No Type of Diet Ordered:: low sodium heart healthy diet Do you have support at home?: Yes People in Home: spouse  Medications Reviewed Today: Medications Reviewed Today     Reviewed by Earlie Server, RN (Registered Nurse) on 12/28/23 at 1021  Med List Status: <None>   Medication Order Taking? Sig Documenting Provider Last Dose Status Informant  acetaminophen (TYLENOL) 500 MG tablet [id number] Yes Take 500 mg by mouth every 6 (six) hours as needed for headache. [provider] Taking Active Self  ALPRAZolam Prudy Feeler) 0.5 MG tablet [id number] Yes Take 1/2 - 1 tablet by mouth at bedtime as needed for anxiety. [place], [name], MD Taking Active Self  amitriptyline (ELAVIL) 25 MG tablet [id number] Yes Take 1 tablet (25 mg total) by mouth daily.  Taking Active Self  amLODipine (NORVASC) 5 MG tablet [id number] Yes Take 1 tablet (5 mg total) by mouth daily. Chrystie Nose, MD Taking Active Self  atorvastatin (LIPITOR) 20 MG tablet [id number] Yes Take 1 tablet (20 mg total) by mouth daily. [place], [name], MD Taking Active Self  baclofen (LIORESAL) 10 MG tablet [id number] Yes Take 1 tablet (10  mg total) by mouth 3 (three) times daily as needed. Ranelle Oyster, MD Taking Active Self           Med Note Sherrie Mustache, [place] A   Mon Nov 30, 2023  1:44 PM) Pt alternates takes baclofen and methocarbamol   betamethasone dipropionate 0.05 % cream [id number] Yes Apply as directed to skin once a day to itchy areas only avoiding normal skin as needed  Taking Active   carvedilol (COREG) 25 MG tablet [id number]  Take 1 tablet (25 mg total) by mouth 2 (two) times daily with a meal.  Patient taking differently: Take 50 mg by mouth 2 (two) times daily with a meal.   Alver Sorrow, NP  Expired 12/25/23 2359 Self           Med Note (ROSE, Keisuke Hollabaugh U   Mon Dec 28, 2023 10:21 AM) Continues to take  cholecalciferol (VITAMIN D3) 25 MCG (1000 UNIT) tablet [id number] Yes Take 1,000 Units by mouth daily. [provider] Taking Active Self  diphenhydrAMINE (BENADRYL) 25 MG tablet [id number] Yes Take 25 mg by mouth daily as needed for itching. [provider] Taking Active Self  docusate sodium (COLACE) 100 MG capsule [id number] Yes Take 100 mg by mouth 2 (two) times daily. [provider] Taking Active   doxycycline (ADOXA) 100 MG tablet [id number] No Take 1 tablet (100 mg total) by mouth every 12 (twelve) hours.  Patient not taking: Reported on 12/28/2023    Not Taking Active   DULoxetine (CYMBALTA) 30 MG capsule [id number] Yes Take 2 capsules (60 mg total) by mouth daily.  Patient taking differently: Take 30 mg  by mouth daily as needed (pain).   Ranelle Oyster, MD Taking Active Self           Med Note (ROSE, Ralene Cork Dec 14, 2023  9:30 AM) Patient is taking 30 mg once a day.   Estradiol Acetate (FEMRING) 0.05 MG/24HR RING [id number] Yes USE ONE RING VAGINALLY EVERY 3 MONTHS AS DIRECTED  Taking Active Self  fluconazole (DIFLUCAN) 200 MG tablet [id number] No Take 1 tablet by mouth once a week  Patient not taking: Reported on 12/22/2023    Not Taking Active Self  gabapentin (NEURONTIN)  600 MG tablet [id number] Yes Take 1 tablet (600 mg total) by mouth 3 (three) times daily. Ranelle Oyster, MD Taking Active Self  hydrALAZINE (APRESOLINE) 50 MG tablet [id number] Yes Take 1 tablet (50 mg total) by mouth 3 (three) times daily.  Patient taking differently: Take 50 mg by mouth 2 (two) times daily.   Reather Littler D, NP Taking Active Self           Med Note (ROSE, Ralene Cork Dec 14, 2023  9:31 AM) Reports taking 2 times per day  methocarbamol (ROBAXIN) 500 MG tablet [id number] Yes Take 1 tablet (500 mg total) by mouth 3 (three) times daily as needed for muscle spasm. Sherryl Manges, NP Taking Active     Discontinued 09/05/23 1325 (Patient Preference)            Med Note>> Lucila Maine, CPhT   09/05/2023  1:25 PM non longer taking    ondansetron (ZOFRAN) 4 MG tablet [id number] Yes Take 1 tablet (4 mg tablet) 30 - 60 minutes each prep dose Sherrilyn Rist, MD Taking Active Self  pregabalin (LYRICA) 300 MG capsule [id number] Yes Take 1 capsule (300 mg total) by mouth 2 (two) times daily. Windell Norfolk, MD Taking Active Self  spironolactone (ALDACTONE) 50 MG tablet [id number]  Take 1 tablet (50 mg total) by mouth daily. Chrystie Nose, MD  Expired 12/25/23 2359 Self  terbinafine (LAMISIL) 1 % cream [id number] Yes Apply 1 Application topically 2 (two) times daily as needed (rash). [provider] Taking Active Self  traMADol (ULTRAM) 50 MG tablet [id number] Yes Take 1-2 tablets (50-100 mg total) by mouth every 6 (six) hours as needed for severe pain (pain score 7-10) or moderate pain (pain score 4-6). Meyran, Tiana Loft, NP Taking Active             Home Care and Equipment/Supplies: Were Home Health Services Ordered?: No Has Agency set up a time to come to your home?: No Any new equipment or medical supplies ordered?: No  Functional Questionnaire: Do you need assistance with bathing/showering or dressing?: Yes (husband) Do you need assistance with meal  preparation?: Yes (husband) Do you need assistance with eating?: No Do you have difficulty maintaining continence: No Do you need assistance with getting out of bed/getting out of a chair/moving?: No Do you have difficulty managing or taking your medications?: No  Follow up appointments reviewed: PCP Follow-up appointment confirmed?: Yes MD Provider Line [phone] Given: No Date of PCP follow-up appointment?: 12/29/23 Follow-up Provider: Dr. [place] [facility] Follow-up appointment confirmed?: Yes Date of Specialist follow-up appointment?: 12/31/23 Follow-Up Specialty Provider:: Dr. Yetta Barre Do you need transportation to your follow-up appointment?: No Do you understand care options if your condition(s) worsen?: Yes-patient verbalized understanding  SDOH Interventions Today    Flowsheet Row Most Recent Value  SDOH Interventions   Food Insecurity  Interventions Intervention Not Indicated  Housing Interventions Intervention Not Indicated  Transportation Interventions Intervention Not Indicated  Utilities Interventions Intervention Not Indicated     Readmitted on 12/25/2023 for Wound irrigation and placement of wound vac. No BM yet. Moving slowly.  Has follow up planned. Using a walker.  SIGNATURE***

## 2023-12-29 ENCOUNTER — Encounter: Payer: Self-pay | Admitting: Family Medicine

## 2023-12-29 ENCOUNTER — Ambulatory Visit: Admitting: Family Medicine

## 2023-12-29 VITALS — BP 128/80 | HR 95 | Resp 16 | Ht 65.0 in

## 2023-12-29 DIAGNOSIS — S31000A Unspecified open wound of lower back and pelvis without penetration into retroperitoneum, initial encounter: Secondary | ICD-10-CM

## 2023-12-29 DIAGNOSIS — I10 Essential (primary) hypertension: Secondary | ICD-10-CM

## 2023-12-29 DIAGNOSIS — N1831 Chronic kidney disease, stage 3a: Secondary | ICD-10-CM | POA: Diagnosis not present

## 2023-12-29 DIAGNOSIS — F411 Generalized anxiety disorder: Secondary | ICD-10-CM

## 2023-12-29 DIAGNOSIS — G894 Chronic pain syndrome: Secondary | ICD-10-CM

## 2023-12-29 LAB — CBC
HCT: 35.6 % — ABNORMAL LOW (ref 36.0–46.0)
Hemoglobin: 11.6 g/dL — ABNORMAL LOW (ref 12.0–15.0)
MCHC: 32.5 g/dL (ref 30.0–36.0)
MCV: 92.5 fl (ref 78.0–100.0)
Platelets: 424 10*3/uL — ABNORMAL HIGH (ref 150.0–400.0)
RBC: 3.85 Mil/uL — ABNORMAL LOW (ref 3.87–5.11)
RDW: 13.9 % (ref 11.5–15.5)
WBC: 6.1 10*3/uL (ref 4.0–10.5)

## 2023-12-29 LAB — BASIC METABOLIC PANEL
BUN: 9 mg/dL (ref 6–23)
CO2: 33 meq/L — ABNORMAL HIGH (ref 19–32)
Calcium: 9.6 mg/dL (ref 8.4–10.5)
Chloride: 102 meq/L (ref 96–112)
Creatinine, Ser: 1.11 mg/dL (ref 0.40–1.20)
GFR: 53.32 mL/min — ABNORMAL LOW (ref >60.00)
Glucose, Bld: 88 mg/dL (ref 70–99)
Potassium: 3.8 meq/L (ref 3.5–5.1)
Sodium: 141 meq/L (ref 135–145)

## 2023-12-29 NOTE — Assessment & Plan Note (Addendum)
 BP otherwise adequately controlled. Continue Spironolactone 50 mg daily, Carvedilol 25 mg bid, Hydralazine 50 mg tid, and Amlodipine 5 mg daily. Continue low salt diet.

## 2023-12-29 NOTE — Progress Notes (Signed)
 ACUTE VISIT Chief Complaint  Patient presents with   Hospitalization Follow-up   HPI: [name] is a 63 y.o. female with a PMHx significant for HTN, HLD, CKD III, chronic pain disorder, MDD, and GAD, who is here today for hospitalization follow up.  Patient had a spinal procedure with Dr. Marikay Alar on 12/07/2023, L4-L5 posterior lateral fusion. [name] was in the hospital from 2/10-2/14. Procedure:  1.) Posterior fixation. L4-5 using ATEC cortical pedicle screws.  2.) Intertransverse arthrodesis L4-5 using morcellized allograft.   [name] was re-admitted on 12/25/23 because poor healing, underwent irrigation and debridement of surgical lumbar wound with placement of external wound VAC. [name] was discharged home on 12/26/23. TOC call 12/28/23.  Currently, [name] states [name] is still having pain from the surgery, as well as some chills and decreased appetite.  Denies any fever, changes in bowel habits, or urinary problems.   States that the wound VAC battery died last night, [name] was supposed to have it on for 7 days. [name] called the hospital and according to pt, [name] was advised to remove it and throw it away. [name] says the wound is uncovered now, soreness worse since off from wound VAC. [name] has not noted drainage.  None of her medications were changed during her hospitalization.  [name] started on Doxycycline after first hospitalization, not taking any abx at this time.  On Tramadol for pain management.  [name] has not had home health services have been arranged at this time. [name] has not felt like home PT was useful. [name] is walking for a few minutes every hour or so.  HTN and CKD III: [name] is on Amlodipine 5 mg daily, Carvedilol 25 mg bid,Hydralazine 50 mg tid,and Spironolactone 50 mg daily. [name] has not noted gross hematuria, foam in urine,or decreased urine output. Negative for severe/frequent headache, visual changes, chest pain, dyspnea, palpitation, new focal weakness, or edema.  Lab  Results  Component Value Date   NA 139 12/01/2023   CL 103 12/01/2023   K 4.0 12/01/2023   CO2 29 12/01/2023   BUN 10 12/01/2023   CREATININE 1.24 (H) 12/01/2023   GFRNONAA 49 (L) 12/01/2023   CALCIUM 9.3 12/01/2023   PHOS 2.7 06/01/2007   ALBUMIN 3.8 10/14/2023   GLUCOSE 85 12/01/2023   Lab Results  Component Value Date   WBC 5.7 12/01/2023   HGB 13.4 12/01/2023   HCT 41.8 12/01/2023   MCV 93.5 12/01/2023   PLT 303 12/01/2023    Review of Systems  Constitutional:  Positive for activity change, appetite change and fatigue.  HENT:  Negative for mouth sores and sore throat.   Gastrointestinal:  Negative for abdominal pain, nausea and vomiting.  Genitourinary:  Negative for difficulty urinating and dysuria.  Musculoskeletal:  Positive for arthralgias, back pain and gait problem.  Skin:  Positive for wound. Negative for rash.  Neurological:  Negative for syncope and facial asymmetry.  Psychiatric/Behavioral:  Negative for confusion and hallucinations.   See other pertinent positives and negatives in HPI.  Current Outpatient Medications on File Prior to Visit  Medication Sig Dispense Refill   acetaminophen (TYLENOL) 500 MG tablet Take 500 mg by mouth every 6 (six) hours as needed for headache.     ALPRAZolam (XANAX) 0.5 MG tablet Take 1/2 - 1 tablet by mouth at bedtime as needed for anxiety. 30 tablet 2   amitriptyline (ELAVIL) 25 MG tablet Take 1 tablet (25 mg total) by mouth daily. 90 tablet 2   amLODipine (NORVASC)  5 MG tablet Take 1 tablet (5 mg total) by mouth daily. 90 tablet 2   atorvastatin (LIPITOR) 20 MG tablet Take 1 tablet (20 mg total) by mouth daily. 90 tablet 2   baclofen (LIORESAL) 10 MG tablet Take 1 tablet (10 mg total) by mouth 3 (three) times daily as needed. 60 each 3   betamethasone dipropionate 0.05 % cream Apply as directed to skin once a day to itchy areas only avoiding normal skin as needed 45 g 0   cholecalciferol (VITAMIN D3) 25 MCG (1000 UNIT) tablet  Take 1,000 Units by mouth daily.     diphenhydrAMINE (BENADRYL) 25 MG tablet Take 25 mg by mouth daily as needed for itching.     docusate sodium (COLACE) 100 MG capsule Take 100 mg by mouth 2 (two) times daily.     doxycycline (ADOXA) 100 MG tablet Take 1 tablet (100 mg total) by mouth every 12 (twelve) hours. 28 tablet 0   DULoxetine (CYMBALTA) 30 MG capsule Take 2 capsules (60 mg total) by mouth daily. (Patient taking differently: Take 30 mg by mouth daily as needed (pain).) 180 capsule 3   Estradiol Acetate (FEMRING) 0.05 MG/24HR RING USE ONE RING VAGINALLY EVERY 3 MONTHS AS DIRECTED 1 each 4   fluconazole (DIFLUCAN) 200 MG tablet Take 1 tablet by mouth once a week 2 tablet 0   gabapentin (NEURONTIN) 600 MG tablet Take 1 tablet (600 mg total) by mouth 3 (three) times daily. 90 tablet 3   hydrALAZINE (APRESOLINE) 50 MG tablet Take 1 tablet (50 mg total) by mouth 3 (three) times daily. (Patient taking differently: Take 50 mg by mouth 2 (two) times daily.) 270 tablet 3   methocarbamol (ROBAXIN) 500 MG tablet Take 1 tablet (500 mg total) by mouth 3 (three) times daily as needed for muscle spasm. 30 tablet 1   ondansetron (ZOFRAN) 4 MG tablet Take 1 tablet (4 mg tablet) 30 - 60 minutes each prep dose 2 tablet 0   pregabalin (LYRICA) 300 MG capsule Take 1 capsule (300 mg total) by mouth 2 (two) times daily. 60 capsule 5   terbinafine (LAMISIL) 1 % cream Apply 1 Application topically 2 (two) times daily as needed (rash).     traMADol (ULTRAM) 50 MG tablet Take 1-2 tablets (50-100 mg total) by mouth every 6 (six) hours as needed for severe pain (pain score 7-10) or moderate pain (pain score 4-6). 30 tablet 0   carvedilol (COREG) 25 MG tablet Take 1 tablet (25 mg total) by mouth 2 (two) times daily with a meal. (Patient taking differently: Take 50 mg by mouth 2 (two) times daily with a meal.) 180 tablet 3   spironolactone (ALDACTONE) 50 MG tablet Take 1 tablet (50 mg total) by mouth daily. 90 tablet 3    [DISCONTINUED] omeprazole (PRILOSEC) 40 MG capsule Take 1 capsule (40 mg total) by mouth daily. 90 capsule 1   No current facility-administered medications on file prior to visit.   Past Medical History:  Diagnosis Date   Anxiety    Aortic atherosclerosis (HCC) 04/16/2021   Atypical angina (HCC)    Back pain    related to spinal stenosis and disc problem, radiates down left buttocks to leg., weakness occ.   Chest pain    a. 03/2015 Cath: nl cors; b. 03/2021 Cor CTA: Ca2+ = 0. Nl Cors.   Chronic pain syndrome    Dyspnea    hx   GERD (gastroesophageal reflux disease)    Grade I diastolic  dysfunction    Headache    Hyperlipidemia    Hypertension    Lumbar post-laminectomy syndrome    LVH (left ventricular hypertrophy) 12/15/2020   a. 11/2020 Echo: EF 65-70%, no rwma, sev asymm LVH with IVSd 1.9 cm. No LVOT obs @ rest. Gr1 DD. Triv MR.   PONV (postoperative nausea and vomiting)    Pulmonary nodules    a. 03/2021 CT Chest: 3mm pulm nodules in bilat lower lobes. F/u 1 yr.   Right foot drop    Syncope    a. 11/2020 Zio: No significant arrhythmias.   Vaginal foreign object    "Uses Femring"   Allergies  Allergen Reactions   Cephalosporins Anaphylaxis   Penicillins Anaphylaxis and Hives   Anesthetics, Amide Nausea And Vomiting    Does not know name of it. States they put it on record foot center.    Betadine [Povidone Iodine] Other (See Comments)    "Skin Burn" caused scar on Left buttock   Latex Hives, Itching, Rash and Other (See Comments)   Peach [Prunus Persica] Hives   Claritin [Loratadine]     pruritis    Hydrocodone     Altered mental state, feel spaced out    Social History   Socioeconomic History   Marital status: Married    Spouse name: Cena Benton   Number of children: 1   Years of education: Not on file   Highest education level: Not on file  Occupational History   Not on file  Tobacco Use   Smoking status: Never   Smokeless tobacco: Never  Vaping Use    Vaping status: Never Used  Substance and Sexual Activity   Alcohol use: Never   Drug use: Never   Sexual activity: Yes    Partners: Male    Birth control/protection: Post-menopausal, Surgical    Comment: Hysterectomy  Other Topics Concern   Not on file  Social History Narrative   Not on file   Social Drivers of Health   Financial Resource Strain: Low Risk  (10/02/2023)   Overall Financial Resource Strain (CARDIA)    Difficulty of Paying Living Expenses: Not hard at all  Food Insecurity: No Food Insecurity (12/28/2023)   Hunger Vital Sign    Worried About Running Out of Food in the Last Year: Never true    Ran Out of Food in the Last Year: Never true  Transportation Needs: No Transportation Needs (12/28/2023)   PRAPARE - Administrator, Civil Service (Medical): No    Lack of Transportation (Non-Medical): No  Physical Activity: Insufficiently Active (10/02/2023)   Exercise Vital Sign    Days of Exercise per Week: 2 days    Minutes of Exercise per Session: 60 min  Stress: No Stress Concern Present (10/02/2023)   [employer] of Occupational Health - Occupational Stress Questionnaire    Feeling of Stress : Not at all  Social Connections: Socially Integrated (10/02/2023)   Social Connection and Isolation Panel [NHANES]    Frequency of Communication with Friends and Family: More than three times a week    Frequency of Social Gatherings with Friends and Family: More than three times a week    Attends Religious Services: More than 4 times per year    Active Member of [employer] or Organizations: Yes    Attends Banker Meetings: More than 4 times per year    Marital Status: Married   Vitals:   12/29/23 1028  BP: 128/80  Pulse: 95  Resp: 16  SpO2: 98%   Body mass index is 28.12 kg/m.  Physical Exam Vitals and nursing note reviewed.  Constitutional:      General: [name] is not in acute distress.    Appearance: [name] is well-developed.  HENT:     Head:  Normocephalic and atraumatic.  Eyes:     Conjunctiva/sclera: Conjunctivae normal.  Cardiovascular:     Rate and Rhythm: Normal rate and regular rhythm.     Heart sounds: No murmur heard.    Comments: PT pulses palpable. Pulmonary:     Effort: Pulmonary effort is normal. No respiratory distress.     Breath sounds: Normal breath sounds.  Abdominal:     Palpations: Abdomen is soft. There is no mass.     Tenderness: There is no abdominal tenderness.  Musculoskeletal:     Right lower leg: No edema.     Left lower leg: No edema.  Skin:    General: Skin is warm.     Findings: No erythema or rash.     Comments: Vertical midline surgical wound, edges 1 mm apart and serose drainage. Surrounded erythema with minimal erythema and no local heat. Very sensitive to light touch.  Neurological:     Mental Status: [name] is alert and oriented to person, place, and time.     Comments: Gait unstable, assisted by a cane. Right foot drop with hard brace. Rest no focal deficit.    Psychiatric:        Mood and Affect: Affect normal. Mood is anxious.    ASSESSMENT AND PLAN:  [name] was seen today for postoperative follow up.  Lab Results  Component Value Date   WBC 6.1 12/29/2023   HGB 11.6 (L) 12/29/2023   HCT 35.6 (L) 12/29/2023   MCV 92.5 12/29/2023   PLT 424.0 (H) 12/29/2023   Lab Results  Component Value Date   NA 141 12/29/2023   CL 102 12/29/2023   K 3.8 12/29/2023   CO2 33 (H) 12/29/2023   BUN 9 12/29/2023   CREATININE 1.11 12/29/2023   GFR 53.32 (L) 12/29/2023   CALCIUM 9.6 12/29/2023   PHOS 2.7 06/01/2007   ALBUMIN 3.8 10/14/2023   GLUCOSE 88 12/29/2023   Open wound of lumbar region S/P L4-L5 posterior fusion, complicated by poor healing. Underwent irrigation and debridement of lumbar wound with wound VAC placement on 12/25/23. Mild edema around surgical wound. While in the office [name] received called from Dr Yetta Barre' office and will be seen this afternoon for wound  check.  Stage 3a chronic kidney disease (HCC) Assessment & Plan: Last Cr 1.2 and e GFR 49 on 12/01/23. Continue adequate hydration, low salt diet,and avoidance of NSAID's.  Orders: -     Basic metabolic panel; Future -     CBC; Future  Essential hypertension Assessment & Plan: BP otherwise adequately controlled. Continue Spironolactone 50 mg daily, Carvedilol 25 mg bid, Hydralazine 50 mg tid, and Amlodipine 5 mg daily. Continue low salt diet.  Orders: -     Basic metabolic panel; Future -     CBC; Future  GAD (generalized anxiety disorder)  Problem has been exacerbated by recent health complications. Currently on Duloxetine 30 mg daily and Alprazolam 0.5 mg 1/2-1 tab at bedtime as needed.  Chronic pain syndrome  Follows with pain management. On Tramadol,Methocarbamol, Amitriptyline, and Gabapentin.  Return for keep next appointment.  I, Suanne Marker, acting as a scribe for Greco Gastelum [place], MD., have documented all relevant documentation on the  behalf of Generoso Cropper [place], MD, as directed by  [place], MD while in the presence of Kamoni Gentles [place], MD.   I, Ezekial Arns [place], MD, have reviewed all documentation for this visit. The documentation on 12/29/23 for the exam, diagnosis, procedures, and orders are all accurate and complete.  Galen Malkowski G. [place], MD  [facility]. [place] office.

## 2023-12-29 NOTE — Patient Instructions (Addendum)
 A few things to remember from today's visit:  Essential hypertension - Plan: Basic metabolic panel, CBC  Stage 3a chronic kidney disease (HCC) - Plan: Basic metabolic panel, CBC  Open wound of lumbar region No changes today. Keep appt with neurosurgeon's office today for wound check. Continue walking a few times during the day as tolerated.  If you need refills for medications you take chronically, please call your pharmacy. Do not use My Chart to request refills or for acute issues that need immediate attention. If you send a my chart message, it may take a few days to be addressed, specially if I am not in the office.  Please be sure medication list is accurate. If a new problem present, please set up appointment sooner than planned today.

## 2023-12-29 NOTE — Assessment & Plan Note (Signed)
 Last Cr 1.2 and e GFR 49 on 12/01/23. Continue adequate hydration, low salt diet,and avoidance of NSAID's.

## 2023-12-30 ENCOUNTER — Other Ambulatory Visit (HOSPITAL_COMMUNITY): Payer: Self-pay

## 2023-12-30 LAB — AEROBIC/ANAEROBIC CULTURE W GRAM STAIN (SURGICAL/DEEP WOUND): Culture: NO GROWTH

## 2023-12-31 ENCOUNTER — Other Ambulatory Visit: Payer: Self-pay

## 2024-01-01 ENCOUNTER — Encounter: Payer: Self-pay | Admitting: Internal Medicine

## 2024-01-01 ENCOUNTER — Ambulatory Visit: Attending: Internal Medicine | Admitting: Internal Medicine

## 2024-01-01 VITALS — BP 130/80 | HR 92 | Ht 65.0 in | Wt 169.0 lb

## 2024-01-01 DIAGNOSIS — R55 Syncope and collapse: Secondary | ICD-10-CM

## 2024-01-01 DIAGNOSIS — E269 Hyperaldosteronism, unspecified: Secondary | ICD-10-CM | POA: Diagnosis not present

## 2024-01-01 DIAGNOSIS — R42 Dizziness and giddiness: Secondary | ICD-10-CM | POA: Diagnosis not present

## 2024-01-01 DIAGNOSIS — I422 Other hypertrophic cardiomyopathy: Secondary | ICD-10-CM | POA: Diagnosis not present

## 2024-01-01 DIAGNOSIS — I10 Essential (primary) hypertension: Secondary | ICD-10-CM | POA: Diagnosis not present

## 2024-01-01 NOTE — Patient Instructions (Signed)
 Medication Instructions:  NO CHANGES  *If you need a refill on your cardiac medications before your next appointment, please call your pharmacy*  Testing/Procedures: Your physician has requested that you have an echocardiogram. Echocardiography is a painless test that uses sound waves to create images of your heart. It provides your doctor with information about the size and shape of your heart and how well your heart's chambers and valves are working. This procedure takes approximately one hour. There are no restrictions for this procedure. Please do NOT wear cologne, perfume, aftershave, or lotions (deodorant is allowed). Please arrive 15 minutes prior to your appointment time.  Please note: We ask at that you not bring children with you during ultrasound (echo/ vascular) testing. Due to room size and safety concerns, children are not allowed in the ultrasound rooms during exams. Our front office staff cannot provide observation of children in our lobby area while testing is being conducted. An adult accompanying a patient to their appointment will only be allowed in the ultrasound room at the discretion of the ultrasound technician under special circumstances. We apologize for any inconvenience.  Test location(s): [facility] - [address] 3rd Floor [facility] - [address]   Follow-Up: At [facility], you and your health needs are our priority.  As part of our continuing mission to provide you with exceptional heart care, we have created designated Provider Care Teams.  These Care Teams include your primary Cardiologist (physician) and Advanced Practice Providers (APPs -  Physician Assistants and Nurse Practitioners) who all work together to provide you with the care you need, when you need it.  We recommend signing up for the patient portal called "MyChart".  Sign up information is provided on this After Visit Summary.  MyChart is used to connect  with patients for Virtual Visits (Telemedicine).  Patients are able to view lab/test results, encounter notes, upcoming appointments, etc.  Non-urgent messages can be sent to your provider as well.   To learn more about what you can do with MyChart, go to [email or web address].    Your next appointment:    12 months with Dr. Rennis Golden  Other Instructions   1st Floor: - Lobby - Registration  - Pharmacy  - Lab - Cafe  2nd Floor: - PV Lab - Diagnostic Testing (echo, CT, nuclear med)  3rd Floor: - Vacant  4th Floor: - TCTS (cardiothoracic surgery) - AFib Clinic - Structural Heart Clinic - Vascular Surgery  - Vascular Ultrasound  5th Floor: - [facility] (general and EP) - Clinical Pharmacy for coumadin, hypertension, lipid, weight-loss medications, and med management appointments    Valet parking services will be available as well.

## 2024-01-01 NOTE — Progress Notes (Signed)
 OFFICE NOTE  Chief Complaint:  Follow-up  Primary Care Physician: [place], [name], MD  HPI:  [name] is a 63 y.o. female with a past medial history significant for hypertension, dyslipidemia, lumbar back pain, anxiety and echo evidence of possible hypertrophic cardiomyopathy.  I had seen her in the past in the hospital but basically she has been followed by our APP's in the office until recently.  She had an echo in February which showed dynamic LVEF 65 to 70% with severe asymmetric left ventricular hypertrophy and a septal dimension of 1.9 cm.  I had recommended a cardiac MRI however this was not performed due to the fact that she has spinal stimulator.  Subsequently she had another echo 2 months later that I did not order but was interpreted as having normal septal thickness.  I personally reviewed the echo and disagree with that finding.  The septum is completely mis-measured.  It actually does measure about 2 cm.  This is consistent with prior findings.  There is family history concerning for hypertrophic cardiomyopathy.  We discussed that in more detail today.  She does have uncontrolled hypertension as well and blood pressure was quite high today 174/102.  She seems to be tolerating carvedilol which was started in the hospital and likely would benefit from more.  With regards to her spinal stimulator, her orthopedic spine surgeon recommended that she could either have the leads replaced with MRI compatible or perhaps have it removed.  She says it actually does not seem to have helped her very much and she is considering having it removed.  03/27/2021  [name] returns today for follow-up.  She continues to have concerns about chest discomfort.  We had discussed pursuing a possible cardiac MRI to evaluate for cardiomyopathy although I do not think these are causing her symptoms.  The issue is that she would have to have her spinal stimulator lead removed and her  orthospine surgeon did not want to do this.  She is sought out another opinion from and may have that performed at an outside hospital.  Blood pressure looks like it continues to be an issue and this may be actually causing a lot of her symptoms.  Of course she could have coronary disease that we have not picked up on as well.  07/10/2021  [name] returns today for follow-up.  Recently she had removal of a spinal stimulator.  She had some difficult recovery from this including a lot of back pain but it seems to be improving.  She was noted to have significant asymmetric septal hypertrophy measuring 1.9 cm by echo in February 2022.  There was no evidence of significant outflow gradient.  Subsequently she had a repeat echo in April.  The septum was measured to 0.65 cm which is surprisingly low and I then personally reviewed the images and noted that it has been missed measured.  The septum actually measures about 1.85 cm which is more consistent with her prior echo.  I suspect that she does have hypertrophic cardiomyopathy.  There is a family history of this and her brother who actually died with it.  He was advised to have septal myomectomy but declined the surgery.  She continues to have some chest discomfort although coronary CT angiography was performed in June 2022 which demonstrated no coronary calcium and no obstructive coronary disease.  We had discussed the definitive cardiac MRI and she said that she is now interested in doing that since  her spinal stimulator leads have been removed.  She did recently have successful MRI of her back.  10/30/2021  [name] returns today for follow-up.  She underwent cardiac MRI on October 31st 2022 which demonstrated mild asymmetric basal septal hypertrophy measuring only 10 mm.  There was an anomalous cord inserting at the basal septum which likely was measured in addition to the septum to provide a larger measurement on echo.  Overall it is  not thought that there is evidence for any hypertrophic cardiomyopathy based on this MRI.  I think this is reassuring and the fact that she had no coronary disease as well by CT makes her dyspnea which is still somewhat persistent, unlikely to be cardiac in origin.  I suspect there may be some degree of deconditioning including issues with chronic pain and having to use a walker.  12/23/2022  [name] is seen today in follow-up.  Recently she has been seen by Gillian Shields, NP for adjustment of her blood pressure medications.  She has had some issues with lower extremity swelling.  This necessitated some decrease in her amlodipine.  She was also on a very high dose of carvedilol 50 mg twice daily which was appropriately cut back to 25 mg twice daily.  Her blood pressure has accordingly gone up somewhat today 150/78.  She is overall done well although after having had recent surgeries including neck surgery with Dr. Yetta Barre.  She has been dealing with issues with carpal tunnel syndrome.  06/23/2023  [name] returns today for follow-up.  In the interim she saw Gillian Shields, NP who made further adjustments in her blood pressure meds.  Today her blood pressure appears to be fairly well-controlled.  She has had some falls and issues with sharp left-sided chest pain into her left breast.  She apparently had some skin changes and possibly an infection.  This was treated by her gynecologist.  I reminded her that she had no detectable coronary artery disease by coronary CT back in 2022.  She did have some mild asymmetric septal hypertrophy.  Her brother had hypertrophic cardiomyopathy and I suspect she might be at risk for that.  She will need follow-up imaging.  01/01/2024  [name] is seen today in follow-up.  She has had recently some issues with her low back including a wound that was poorly healing requiring I believe 2 surgical procedures.  She has had a lot of pain  with that.  She denies any chest pain per se or worsening shortness of breath.  Her last echo was in 2023 to reassess for hypertrophic cardiomyopathy.  Surprisingly her cardiac MRI suggested relatively normal wall thickness.  Her last echo however again suggested mild to moderate asymmetric septal hypertrophy.  PMHx:  Past Medical History:  Diagnosis Date   Anxiety    Aortic atherosclerosis (HCC) 04/16/2021   Atypical angina (HCC)    Back pain    related to spinal stenosis and disc problem, radiates down left buttocks to leg., weakness occ.   Chest pain    a. 03/2015 Cath: nl cors; b. 03/2021 Cor CTA: Ca2+ = 0. Nl Cors.   Chronic pain syndrome    Dyspnea    hx   GERD (gastroesophageal reflux disease)    Grade I diastolic dysfunction    Headache    Hyperlipidemia    Hypertension    Lumbar post-laminectomy syndrome    LVH (left ventricular hypertrophy) 12/15/2020   a. 11/2020 Echo: EF 65-70%, no rwma, sev  asymm LVH with IVSd 1.9 cm. No LVOT obs @ rest. Gr1 DD. Triv MR.   PONV (postoperative nausea and vomiting)    Pulmonary nodules    a. 03/2021 CT Chest: 3mm pulm nodules in bilat lower lobes. F/u 1 yr.   Right foot drop    Syncope    a. 11/2020 Zio: No significant arrhythmias.   Vaginal foreign object    "Uses Femring"    Past Surgical History:  Procedure Laterality Date   ABDOMINAL HYSTERECTOMY     ANTERIOR CERVICAL DECOMP/DISCECTOMY FUSION N/A 01/03/2022   Procedure: Anterior Cervical Decompression Fusion - Cervical four-Cervical five - Cervical five-Cervical six;  Surgeon: Tia Alert, MD;  Location: [facility] OR;  Service: Neurosurgery;  Laterality: N/A;   BIOPSY  12/16/2020   Procedure: BIOPSY;  Surgeon: Meryl Dare, MD;  Location: [facility] ENDOSCOPY;  Service: Endoscopy;;   CARDIAC CATHETERIZATION N/A 04/18/2015   Procedure: Left Heart Cath and Coronary Angiography;  Surgeon: Rinaldo Cloud, MD;  Location: [facility] INVASIVE CV LAB;  Service: Cardiovascular;  Laterality: N/A;    COLONOSCOPY     COLONOSCOPY W/ BIOPSIES AND POLYPECTOMY  2018   ESOPHAGOGASTRODUODENOSCOPY (EGD) WITH PROPOFOL N/A 12/16/2020   Procedure: ESOPHAGOGASTRODUODENOSCOPY (EGD) WITH PROPOFOL;  Surgeon: Meryl Dare, MD;  Location: [facility] ENDOSCOPY;  Service: Endoscopy;  Laterality: N/A;   FOOT SURGERY Bilateral    [facility] "bunion,bone spur, tendon" (1) -6'16, (1)-10'16   HEMATOMA EVACUATION N/A 01/05/2022   Procedure: Cervical Wound Exploration;  Surgeon: Coletta Memos, MD;  Location: [facility] OR;  Service: Neurosurgery;  Laterality: N/A;   IR EPIDUROGRAPHY  07/21/2018   LUMBAR LAMINECTOMY/DECOMPRESSION MICRODISCECTOMY Bilateral 12/28/2015   Procedure: MICRO LUMBAR DECOMPRESSION L4 - L5 BILATERALLY;  Surgeon: Jene Every, MD;  Location: [facility] ORS;  Service: Orthopedics;  Laterality: Bilateral;   LUMBAR LAMINECTOMY/DECOMPRESSION MICRODISCECTOMY Bilateral 03/04/2018   Procedure: Revision of Microlumbar Decompression Bilateral Lumbar Four-Five;  Surgeon: Jene Every, MD;  Location: [facility] OR;  Service: Orthopedics;  Laterality: Bilateral;  90 mins   LUMBAR WOUND DEBRIDEMENT N/A 12/25/2023   Procedure: Irrigation and debridement of lumbar wound;  Surgeon: Arman Bogus, MD;  Location: [facility] OR;  Service: Neurosurgery;  Laterality: N/A;   POSTERIOR FUSION PEDICLE SCREW PLACEMENT N/A 12/07/2023   Procedure: LUMBAR FOUR-FIVE POSTERIOR LATERAL FUSION;  Surgeon: Arman Bogus, MD;  Location: [facility] OR;  Service: Neurosurgery;  Laterality: N/A;   SAVORY DILATION N/A 12/16/2020   Procedure: SAVORY DILATION;  Surgeon: Meryl Dare, MD;  Location: [facility] ENDOSCOPY;  Service: Endoscopy;  Laterality: N/A;   SPINAL CORD STIMULATOR INSERTION N/A 09/28/2019   Procedure: THORACIC SPINAL CORD STIMULATOR INSERTION;  Surgeon: Venita Lick, MD;  Location: [facility] OR;  Service: Orthopedics;  Laterality: N/A;  2.5 hrs   SPINAL CORD STIMULATOR REMOVAL N/A 05/27/2021   Procedure: LUMBAR SPINAL CORD STIMULATOR REMOVAL;   Surgeon: Lucy Chris, MD;  Location: [facility] ORS;  Service: Neurosurgery;  Laterality: N/A;   TUBAL LIGATION     WISDOM TOOTH EXTRACTION     WOUND EXPLORATION N/A 03/04/2018   Procedure: EXPLORATION OF LUMBAR DECOMPRESSION WOUND;  Surgeon: Jene Every, MD;  Location: [facility] OR;  Service: Orthopedics;  Laterality: N/A;    FAMHx:  Family History  Problem Relation Age of Onset   Heart attack Mother    Lung cancer Father    Cancer Father    Pancreatic cancer Sister    Breast cancer Sister 48   Multiple myeloma Sister    Breast cancer Sister  diagnosed in her 28's   Heart attack Sister    Throat cancer Brother    Lung cancer Brother    Stomach cancer Cousin    Colon cancer Neg Hx    Colon polyps Neg Hx    Esophageal cancer Neg Hx    Rectal cancer Neg Hx     SOCHx:   reports that she has never smoked. She has never used smokeless tobacco. She reports that she does not drink alcohol and does not use drugs.  ALLERGIES:  Allergies  Allergen Reactions   Cephalosporins Anaphylaxis   Penicillins Anaphylaxis and Hives   Anesthetics, Amide Nausea And Vomiting    Does not know name of it. States they [facility] it on [facility].    Betadine [Povidone Iodine] Other (See Comments)    "Skin Burn" caused scar on Left buttock   Latex Hives, Itching, Rash and Other (See Comments)   Peach [Prunus Persica] Hives   Claritin [Loratadine]     pruritis    Hydrocodone     Altered mental state, feel spaced out     ROS: Pertinent items noted in HPI and remainder of comprehensive ROS otherwise negative.  HOME MEDS: Current Outpatient Medications on File Prior to Visit  Medication Sig Dispense Refill   acetaminophen (TYLENOL) 500 MG tablet Take 500 mg by mouth every 6 (six) hours as needed for headache.     ALPRAZolam (XANAX) 0.5 MG tablet Take 1/2 - 1 tablet by mouth at bedtime as needed for anxiety. 30 tablet 2   amitriptyline (ELAVIL) 25 MG tablet Take 1 tablet (25 mg total) by mouth  daily. 90 tablet 2   amLODipine (NORVASC) 5 MG tablet Take 1 tablet (5 mg total) by mouth daily. 90 tablet 2   atorvastatin (LIPITOR) 20 MG tablet Take 1 tablet (20 mg total) by mouth daily. 90 tablet 2   baclofen (LIORESAL) 10 MG tablet Take 1 tablet (10 mg total) by mouth 3 (three) times daily as needed. 60 each 3   betamethasone dipropionate 0.05 % cream Apply as directed to skin once a day to itchy areas only avoiding normal skin as needed 45 g 0   cholecalciferol (VITAMIN D3) 25 MCG (1000 UNIT) tablet Take 1,000 Units by mouth daily.     diphenhydrAMINE (BENADRYL) 25 MG tablet Take 25 mg by mouth daily as needed for itching.     docusate sodium (COLACE) 100 MG capsule Take 100 mg by mouth 2 (two) times daily.     doxycycline (ADOXA) 100 MG tablet Take 1 tablet (100 mg total) by mouth every 12 (twelve) hours. 28 tablet 0   DULoxetine (CYMBALTA) 30 MG capsule Take 2 capsules (60 mg total) by mouth daily. (Patient taking differently: Take 30 mg by mouth daily as needed (pain).) 180 capsule 3   Estradiol Acetate (FEMRING) 0.05 MG/24HR RING USE ONE RING VAGINALLY EVERY 3 MONTHS AS DIRECTED 1 each 4   fluconazole (DIFLUCAN) 200 MG tablet Take 1 tablet by mouth once a week 2 tablet 0   gabapentin (NEURONTIN) 600 MG tablet Take 1 tablet (600 mg total) by mouth 3 (three) times daily. 90 tablet 3   hydrALAZINE (APRESOLINE) 50 MG tablet Take 1 tablet (50 mg total) by mouth 3 (three) times daily. (Patient taking differently: Take 50 mg by mouth 2 (two) times daily.) 270 tablet 3   methocarbamol (ROBAXIN) 500 MG tablet Take 1 tablet (500 mg total) by mouth 3 (three) times daily as needed for muscle spasm.  30 tablet 1   ondansetron (ZOFRAN) 4 MG tablet Take 1 tablet (4 mg tablet) 30 - 60 minutes each prep dose 2 tablet 0   pregabalin (LYRICA) 300 MG capsule Take 1 capsule (300 mg total) by mouth 2 (two) times daily. 60 capsule 5   terbinafine (LAMISIL) 1 % cream Apply 1 Application topically 2 (two) times  daily as needed (rash).     traMADol (ULTRAM) 50 MG tablet Take 1-2 tablets (50-100 mg total) by mouth every 6 (six) hours as needed for severe pain (pain score 7-10) or moderate pain (pain score 4-6). 30 tablet 0   carvedilol (COREG) 25 MG tablet Take 1 tablet (25 mg total) by mouth 2 (two) times daily with a meal. (Patient taking differently: Take 50 mg by mouth 2 (two) times daily with a meal.) 180 tablet 3   spironolactone (ALDACTONE) 50 MG tablet Take 1 tablet (50 mg total) by mouth daily. 90 tablet 3   [DISCONTINUED] omeprazole (PRILOSEC) 40 MG capsule Take 1 capsule (40 mg total) by mouth daily. 90 capsule 1   No current facility-administered medications on file prior to visit.    LABS/IMAGING: No results found. However, due to the size of the patient record, not all encounters were searched. Please check Results Review for a complete set of results. No results found.  LIPID PANEL:    Component Value Date/Time   CHOL 199 10/14/2023 1000   TRIG 45.0 10/14/2023 1000   HDL 72.60 10/14/2023 1000   CHOLHDL 3 10/14/2023 1000   VLDL 9.0 10/14/2023 1000   LDLCALC 117 (H) 10/14/2023 1000     WEIGHTS: Wt Readings from Last 3 Encounters:  01/01/24 169 lb (76.7 kg)  12/25/23 169 lb (76.7 kg)  12/07/23 169 lb (76.7 kg)    VITALS: BP 130/80 (BP Location: Left Arm, Patient Position: Sitting, Cuff Size: Normal)   Pulse 92   Ht 5\' 5"  (1.651 m)   Wt 169 lb (76.7 kg)   SpO2 98%   BMI 28.12 kg/m   EXAM: General appearance: alert Neck: no carotid bruit, no JVD, and thyroid not enlarged, symmetric, no tenderness/mass/nodules Lungs: clear to auscultation bilaterally Heart: regular rate and rhythm Abdomen: soft, non-tender; bowel sounds normal; no masses,  no organomegaly Extremities: extremities normal, atraumatic, no cyanosis or edema Pulses: 2+ and symmetric Skin: Skin color, texture, turgor normal. No rashes or lesions Neurologic: Grossly normal Psych:  Pleasant  EKG: Deferred  ASSESSMENT: Chest pain/dyspnea-no evidence of coronary artery disease by CT coronary angiography in 03/2021, minimal aortic atherosclerosis Echocardiographic features of hypertrophic cardiomyopathy, IVSD 19 mm (11/2020), however cardiac MRI did not show HOCM and basal septum measured 10 mm with an anomalous cord (07/2021) No evidence for LVOT obstruction Hypertension No coronary artery disease/coronary calcium by CT coronary angiography (03/2021) Family history of hypertrophic cardiomyopathy in her brother, nephew and mother  PLAN: 1.   Ms. [name] has good control of her blood pressure today.  She reported that she also had a nephew who has hypertrophic cardiomyopathy and that her brother and mother also apparently have it.  Will plan to obtain another echocardiogram.  I would likely also will refer to Dr. Timoteo Gaul in the HCM clinic.  She would probably benefit from genetic testing and further evaluation for HCM.  Chrystie Nose, MD, [facility], FACP  [place]  [facility]  Medical Director of the Advanced Lipid Disorders &  Cardiovascular Risk Reduction Clinic Diplomate of the American Board of Clinical Lipidology Attending Cardiologist  Direct Dial: [phone]  Fax: [phone]  Website:  [email or web address] 01/01/2024, 12:08 PM

## 2024-01-05 ENCOUNTER — Other Ambulatory Visit (HOSPITAL_COMMUNITY): Payer: Self-pay

## 2024-01-05 ENCOUNTER — Other Ambulatory Visit: Payer: Self-pay

## 2024-01-05 ENCOUNTER — Other Ambulatory Visit: Payer: Self-pay | Admitting: *Deleted

## 2024-01-05 DIAGNOSIS — M4316 Spondylolisthesis, lumbar region: Secondary | ICD-10-CM | POA: Diagnosis not present

## 2024-01-05 MED ORDER — TRAMADOL HCL 50 MG PO TABS
50.0000 mg | ORAL_TABLET | Freq: Every day | ORAL | 0 refills | Status: DC | PRN
Start: 2024-01-05 — End: 2024-02-03
  Filled 2024-01-05: qty 45, 22d supply, fill #0

## 2024-01-05 MED ORDER — DIAZEPAM 5 MG PO TABS
5.0000 mg | ORAL_TABLET | Freq: Once | ORAL | 0 refills | Status: DC
  Filled 2024-01-05: qty 1, 1d supply, fill #0

## 2024-01-05 NOTE — Patient Instructions (Addendum)
 Visit Information  Thank you for taking time to visit with me today. Please don't hesitate to contact me if I can be of assistance to you before our next scheduled telephone appointment.  Your next appointment is by telephone on Friday 01/15/2024 at 10:00 am with Nurse RN CM Marchelle Folks  Please call the care guide team at [phone] if you need to cancel or reschedule your appointment.   Following are the goals we discussed today:  Participate in Transition of Care Program/Attend [facility] scheduled calls Notify RN Care Manager of TOC call rescheduling needs Take all medications as prescribed Attend all scheduled provider appointments Call provider office for new concerns or questions   If you are experiencing a Mental Health or Behavioral Health Crisis or need someone to talk to, please  call the Suicide and Crisis Lifeline: 988 call the [place] National Suicide Prevention Lifeline: 3146967256 or TTY: 317 865 0856 TTY 310-197-0233) to talk to a trained counselor call 1-800-273-TALK (toll free, 24 hour hotline) go to [facility] [address] [place] [phone]) call the [facility] Crisis Line: [phone] call 911   Patient verbalizes understanding of instructions and care plan provided today and agrees to view in MyChart. Active MyChart status and patient understanding of how to access instructions and care plan via MyChart confirmed with patient.     Pls call/ message for questions,  Caryl Pina, RN, BSN, CCRN Alumnus RN Care Manager  Transitions of Care  [facility] [phone]: direct office

## 2024-01-05 NOTE — Patient Outreach (Addendum)
 Care Management  Transitions of Care Program Transitions of Care Post-discharge week 2/ day # 8   01/05/2024 Name: Breionna Punt MRN: [id number] DOB: Mar 22, 1961  Subjective: Gwendalyn Mcgonagle is a 63 y.o. year old female who is a primary care patient of [place], Timoteo Expose, MD. The Care Management team Engaged with patient Engaged with patient by telephone to assess and address transitions of care needs.   Consent to Services:  Patient was given information about care management services, agreed to services, and gave verbal consent to participate.  TOC 30-day program-- re-enrolled 12/28/23  Assessment: Brief call placed to patient, as scheduled: she immediately stated, "I called them yesterday and asked for this call to be re-scheduled.  I am at the surgeon's office now, they are checking me for ongoing pain.  I am okay, but they have scheduled me for an MRI next Wednesday and then to see the surgeon on Thursday.  Please just have Marchelle Folks call me next week on Friday"  Denies unresolved or new concerns and sounds to be in no distress throughout [facility] 30-day program outreach call today          SDOH Interventions    Flowsheet Row Telephone from 12/28/2023 in [place] POPULATION HEALTH DEPARTMENT Telephone from 12/14/2023 in [place] POPULATION HEALTH DEPARTMENT Admission (Discharged) from 12/07/2023 in [place] [facility] Clinical Support from 10/02/2023 in [facility] at [place] Clinical Support from 09/29/2022 in [facility] [place] [facility] at [place] Chronic Care Management from 08/05/2022 in [facility] [place] [facility] at [place]  SDOH Interventions        Food Insecurity Interventions Intervention Not Indicated Intervention Not Indicated Intervention Not Indicated Intervention Not Indicated Intervention Not Indicated --  Housing Interventions Intervention Not Indicated Intervention Not Indicated Intervention Not Indicated  Intervention Not Indicated Intervention Not Indicated --  Transportation Interventions Intervention Not Indicated Intervention Not Indicated -- Intervention Not Indicated Intervention Not Indicated Intervention Not Indicated  Utilities Interventions Intervention Not Indicated Intervention Not Indicated -- Intervention Not Indicated Intervention Not Indicated --  Alcohol Usage Interventions -- -- -- Intervention Not Indicated (Score <7) Intervention Not Indicated (Score <7) --  Financial Strain Interventions -- -- -- Intervention Not Indicated Intervention Not Indicated --  Physical Activity Interventions -- -- -- Intervention Not Indicated Intervention Not Indicated --  Stress Interventions -- -- -- Intervention Not Indicated Intervention Not Indicated --  Social Connections Interventions -- -- -- Intervention Not Indicated Intervention Not Indicated --  Health Literacy Interventions -- -- -- Intervention Not Indicated -- --        Goals Addressed               This Visit's Progress     TOC- Care plan.   Patient will report no readmissions in the next 30 days. (pt-stated)   On track     Current Barriers:  Equipment/DME new wound vac Incision and drainage of previous lumbar surgical site- Readmission. Constipation- 01/05/24: per patient report this is now resolved  RNCM Clinical Goal(s):  Patient will work with the Care Management team over the next 30 days to address Transition of Care Barriers: Equipment/DME Patient will report normal bowel movements after surgery attend all scheduled medical appointments: PCP and specialist as evidenced by review of EMR and patient report  through collaboration with RN Care manager, provider, and care team.   Interventions: Evaluation of current treatment plan related to  self management and patient's adherence to plan as established by  provider  Transitions of Care:  Goal on track:  Yes. 01/05/24  Brief call placed to patient, as scheduled: she  immediately stated, "I called them yesterday and asked for this call to be re-scheduled.  I am at the surgeon's office now, they are checking me for ongoing pain.  I am okay, but they have scheduled me for an MRI next Wednesday and then to see the surgeon on Thursday.  Please just have Marchelle Folks call me next week on Friday"  Briefly reviewed doctor Visits - PCP on 12/29/23; surgical provider 12/31/23; 01/01/24 cardiology provider for ECHOcardiogram:  verbalizes good understanding of same; denies questions/ concerns post-recent provider office visits Per review of PCP office visit notes on 12/29/23: wound vac malfunctioned on evening of 12/28/23- is no longer present Reviewed multiple upcoming provider office visits, as per patient report- above: confirmed patient is aware of all and has plans to attend as scheduled Confirmed patient no longer having issues around constipation- she reports this has "completely" resolved; she denies clinical concerns, reports remains in ongoing pain- declines pain assessment, as she is currently in surgeon's office awaiting x-rays Positive reinforcement provided for patient attending provider office visits and contacting surgical provider around her ongoing concerns with pain/ wound management   Patient Goals/Self-Care Activities: Participate in Transition of Care Program/Attend [facility] scheduled calls Notify RN Care Manager of [facility] call rescheduling needs Take all medications as prescribed Attend all scheduled provider appointments Call provider office for new concerns or questions   Follow Up Plan:  Telephone follow up appointment with care management team member scheduled for:  01/15/2024 with established RN CM Marchelle Folks The patient has been provided with contact information for the care management team and has been advised to call with any health related questions or concerns.         Plan: Telephone follow up appointment with care management team member scheduled for:     01/15/2024 with established RN CM Marchelle Folks  Total time spent from review to signing of note/ including any care coordination interventions: 27 minutes:  call/ documentation/ updating care plan/ wrap up  Pls call/ message for questions,  Caryl Pina, RN, BSN, CCRN Alumnus RN Care Manager  Transitions of Care  [facility] - [facility] [phone]: direct office

## 2024-01-12 DIAGNOSIS — M2548 Effusion, other site: Secondary | ICD-10-CM | POA: Diagnosis not present

## 2024-01-12 DIAGNOSIS — M4316 Spondylolisthesis, lumbar region: Secondary | ICD-10-CM | POA: Diagnosis not present

## 2024-01-12 DIAGNOSIS — M5126 Other intervertebral disc displacement, lumbar region: Secondary | ICD-10-CM | POA: Diagnosis not present

## 2024-01-12 DIAGNOSIS — M48061 Spinal stenosis, lumbar region without neurogenic claudication: Secondary | ICD-10-CM | POA: Diagnosis not present

## 2024-01-13 ENCOUNTER — Other Ambulatory Visit (HOSPITAL_BASED_OUTPATIENT_CLINIC_OR_DEPARTMENT_OTHER): Payer: Self-pay | Admitting: Internal Medicine

## 2024-01-13 ENCOUNTER — Other Ambulatory Visit (HOSPITAL_COMMUNITY): Payer: Self-pay

## 2024-01-14 ENCOUNTER — Other Ambulatory Visit (HOSPITAL_COMMUNITY): Payer: Self-pay

## 2024-01-14 MED ORDER — NUCYNTA 50 MG PO TABS
50.0000 mg | ORAL_TABLET | Freq: Four times a day (QID) | ORAL | 0 refills | Status: DC | PRN
Start: 2024-01-14 — End: 2024-05-04
  Filled 2024-01-14: qty 30, 8d supply, fill #0

## 2024-01-15 ENCOUNTER — Other Ambulatory Visit (HOSPITAL_COMMUNITY): Payer: Self-pay

## 2024-01-15 ENCOUNTER — Other Ambulatory Visit: Payer: Self-pay

## 2024-01-15 ENCOUNTER — Other Ambulatory Visit (HOSPITAL_BASED_OUTPATIENT_CLINIC_OR_DEPARTMENT_OTHER): Payer: Self-pay

## 2024-01-15 MED ORDER — SPIRONOLACTONE 50 MG PO TABS
50.0000 mg | ORAL_TABLET | Freq: Every day | ORAL | 3 refills | Status: DC
  Filled 2024-01-15 – 2024-01-19 (×3): qty 90, 90d supply, fill #0
  Filled 2024-04-08: qty 90, 90d supply, fill #1

## 2024-01-15 NOTE — Patient Outreach (Signed)
 Care Management  Transitions of Care Program Transitions of Care Post-discharge week 3   01/15/2024 Name: [name] MRN: [id number] DOB: Sep 09, 1961  Subjective: [name] is a 63 y.o. year old female who is a primary care patient of [place], Timoteo Expose, MD. The Care Management team Engaged with patient Engaged with patient by telephone to assess and address transitions of care needs.   Consent to Services:  Patient was given information about care management services, agreed to services, and gave verbal consent to participate.   Assessment: Reports that she saw surgeon yesterday and MRI reports read that site is healing but no infection. Patient reports surgeon told her incision looks good.  No drainage.  Reports bowels are moving well. Patient continues to have severe back pain.   Reports new prescription for Tapentadol. Pending authorization.           SDOH Interventions    Flowsheet Row Telephone from 12/28/2023 in [place] POPULATION HEALTH DEPARTMENT Telephone from 12/14/2023 in [place] POPULATION HEALTH DEPARTMENT Admission (Discharged) from 12/07/2023 in [place] [facility] Clinical Support from 10/02/2023 in [facility] at [place] Clinical Support from 09/29/2022 in [facility] at [place] Chronic Care Management from 08/05/2022 in [facility] [place] [facility] at [place]  SDOH Interventions        Food Insecurity Interventions Intervention Not Indicated Intervention Not Indicated Intervention Not Indicated Intervention Not Indicated Intervention Not Indicated --  Housing Interventions Intervention Not Indicated Intervention Not Indicated Intervention Not Indicated Intervention Not Indicated Intervention Not Indicated --  Transportation Interventions Intervention Not Indicated Intervention Not Indicated -- Intervention Not Indicated Intervention Not Indicated Intervention Not Indicated  Utilities  Interventions Intervention Not Indicated Intervention Not Indicated -- Intervention Not Indicated Intervention Not Indicated --  Alcohol Usage Interventions -- -- -- Intervention Not Indicated (Score <7) Intervention Not Indicated (Score <7) --  Financial Strain Interventions -- -- -- Intervention Not Indicated Intervention Not Indicated --  Physical Activity Interventions -- -- -- Intervention Not Indicated Intervention Not Indicated --  Stress Interventions -- -- -- Intervention Not Indicated Intervention Not Indicated --  Social Connections Interventions -- -- -- Intervention Not Indicated Intervention Not Indicated --  Health Literacy Interventions -- -- -- Intervention Not Indicated -- --        Goals Addressed               This Visit's Progress     TOC- Care plan.   Patient will report no readmissions in the next 30 days. (pt-stated)        Current Barriers:  Equipment/DME new wound vac.  01/15/2024 No more wound vac. Incision is healing per surgeon. No dressing needed at this time.  Incision and drainage of previous lumbar surgical site- Readmission. 01/15/2024  Back incision is healing. Constipation- 01/05/24: per patient report this is now resolved 01/15/2024  continues to report normal bowel movements. Continues to take colace. Severe pain ( 01/15/2024)  patient was prescribed Tapentadol  ( since she is allergic to oxycondone, hydrocodone- causes itching)  Patient was prescribed this medications on 01/14/2024 but was not able to get filled at pharmacy due to need for prior authorizations. Medical sales representative are working on this.   RNCM Clinical Goal(s):  Patient will work with the Care Management team over the next 30 days to address Transition of Care Barriers: Equipment/DME Patient will report normal bowel movements after surgery attend all scheduled medical appointments: PCP and specialist as  evidenced by review of EMR and patient report  through collaboration with  RN Care manager, provider, and care team.   Interventions: Evaluation of current treatment plan related to  self management and patient's adherence to plan as established by provider  Transitions of Care:  Goal on track:  Yes.   Reviewed pain control Reviewed all medications Reviewed importance of walking Reviewed fall prevention Provided a listening ear as patient is very frustrated with delay in healing and continuous pain. Reviewed importance of getting some sunshine. We discussed prayer and mediation to help Encouraged patient to eat more protein to aid in healing.  Encouraged patient to call me if she needs assistance with her new prescription. She is self managing her care without difficultly at this time. I reminded patient I am here to help her if she needs help.   Patient Goals/Self-Care Activities: Participate in Transition of Care Program/Attend [facility] scheduled calls Notify RN Care Manager of TOC call rescheduling needs Take all medications as prescribed Attend all scheduled provider appointments Call provider office for new concerns or questions  Follow up with community pharmacy related to new medications  Follow Up Plan:  Telephone follow up appointment with care management team member scheduled for:  01/20/2024  The patient has been provided with contact information for the care management team and has been advised to call with any health related questions or concerns.          Plan: Telephone follow up appointment with care management team member scheduled for: 01/20/2024  Lonia Chimera, RN, BSN, CEN Population Health- Transition of Care Team.  [facility] [phone]

## 2024-01-19 ENCOUNTER — Other Ambulatory Visit (HOSPITAL_COMMUNITY): Payer: Self-pay

## 2024-01-19 ENCOUNTER — Other Ambulatory Visit: Payer: Self-pay

## 2024-01-20 ENCOUNTER — Other Ambulatory Visit: Payer: Self-pay

## 2024-01-20 ENCOUNTER — Other Ambulatory Visit (HOSPITAL_COMMUNITY): Payer: Self-pay

## 2024-01-20 NOTE — Patient Instructions (Signed)
 Visit Information  Thank you for taking time to visit with me today. Please don't hesitate to contact me if I can be of assistance to you before our next scheduled telephone appointment.  Our next appointment is by telephone on 01/27/2024 at 10:00  Following is a copy of your care plan:   Goals Addressed               This Visit's Progress     TOC- Care plan.   Patient will report no readmissions in the next 30 days. (pt-stated)        Current Barriers:  Equipment/DME new wound vac.  01/15/2024 No more wound vac. Incision is healing per surgeon. No dressing needed at this time. 01/20/2024  no longer a barrier Incision and drainage of previous lumbar surgical site- Readmission. 01/15/2024  Back incision is healing.  01/20/2024  reports back is dry. Reports she feels like she is healing. Constipation- 01/05/24: per patient report this is now resolved 01/15/2024  continues to report normal bowel movements. Continues to take colace. 01/20/2024 no longer a barrier Severe pain ( 01/15/2024)  patient was prescribed Tapentadol  ( since she is allergic to oxycondone, hydrocodone- causes itching)  Patient was prescribed this medications on 01/14/2024 but was not able to get filled at pharmacy due to need for prior authorizations. Medical sales representative are working on this. 01/20/2024  reports that she still does not have her new pain medications. Pain 6/10 today. Continues to take tramadol for pain.   RNCM Clinical Goal(s):  Patient will work with the Care Management team over the next 30 days to address Transition of Care Barriers: Equipment/DME Patient will report normal bowel movements after surgery attend all scheduled medical appointments: PCP and specialist as evidenced by review of EMR and patient report  through collaboration with RN Care manager, provider, and care team.   Interventions: Evaluation of current treatment plan related to  self management and patient's adherence to plan  as established by provider  Transitions of Care:  Goal on track:  Yes.   Reviewed pain control- 01/20/2024 placed call to [facility]. Spoke with Michala. Confrimed that RX is ready and cost is 3.24.  Spoke with patient and she wants home delivery. Pharmacy informed.  Reviewed all medications.  01/20/2024  Encouraged patient to continue to take all her medications as prescribed.  Reviewed importance of walking Reviewed fall prevention 01/20/2024 no falls.  No going up any steps due to fear of falling.  We discussed prayer and mediation to help Encouraged patient to eat more protein to aid in healing.  We discussed importance of a healthy diet.   Patient Goals/Self-Care Activities: Participate in Transition of Care Program/Attend [facility] scheduled calls Notify RN Care Manager of TOC call rescheduling needs Take all medications as prescribed Attend all scheduled provider appointments Call provider office for new concerns or questions  Continue to be mobile Continue to be cautious of falls Take new pain medication when it arrives. Be cautious the first time you take it until you see how your body reacts.  Call RN case manager Marchelle Folks if needed  Follow Up Plan:  Telephone follow up appointment with care management team member scheduled for:  01/27/2024  The patient has been provided with contact information for the care management team and has been advised to call with any health related questions or concerns.          Patient verbalizes understanding of instructions and care plan provided  today and agrees to view in MyChart. Active MyChart status and patient understanding of how to access instructions and care plan via MyChart confirmed with patient.     Telephone follow up appointment with care management team member scheduled for: 01/27/2024  Please call the care guide team at [phone] if you need to cancel or reschedule your appointment.   Please call the Suicide and  Crisis Lifeline: 988 call the [place] National Suicide Prevention Lifeline: 989-242-9376 or TTY: (806)584-7634 TTY 865-549-8278) to talk to a trained counselor call 1-800-273-TALK (toll free, 24 hour hotline) call 911 if you are experiencing a Mental Health or Behavioral Health Crisis or need someone to talk to.  Lonia Chimera, RN, BSN, CEN [employer]- Transition of Care Team.  [facility] [phone]

## 2024-01-20 NOTE — Patient Outreach (Signed)
 Care Management  Transitions of Care Program Transitions of Care Post-discharge week 4   01/20/2024 Name: [name] MRN: [id number] DOB: 09/03/1961  Subjective: [name] is a 63 y.o. year old female who is a primary care patient of [name] Expose, MD. The Care Management team Engaged with patient Engaged with patient by telephone to assess and address transitions of care needs.   Consent to Services:  Patient was given information about care management services, agreed to services, and gave verbal consent to participate.   Assessment: Patient reports that she is doing well. Reports that she is still having significant back pain. Reports that she has not been able to get her new pain medications due to pending prior authorization.  Reports she feels like she is healing. Reports that her back incision site is dry.  She was putting neosporin on it.  Reports that she continues to have a decrease in appetite.  Does not know how much weight she has lost, Reports that she will start to eat and then not be hungry anymore.  No additional concerns today.           SDOH Interventions    Flowsheet Row Telephone from 12/28/2023 in [place] POPULATION HEALTH DEPARTMENT Telephone from 12/14/2023 in [place] POPULATION HEALTH DEPARTMENT Admission (Discharged) from 12/07/2023 in [place] Progressive Care Clinical Support from 10/02/2023 in [facility] HealthCare at [place] Clinical Support from 09/29/2022 in [facility] [place] [facility] at [place] Chronic Care Management from 08/05/2022 in [facility] [place]  SDOH Interventions        Food Insecurity Interventions Intervention Not Indicated Intervention Not Indicated Intervention Not Indicated Intervention Not Indicated Intervention Not Indicated --  Housing Interventions Intervention Not Indicated Intervention Not Indicated Intervention Not Indicated Intervention Not Indicated  Intervention Not Indicated --  Transportation Interventions Intervention Not Indicated Intervention Not Indicated -- Intervention Not Indicated Intervention Not Indicated Intervention Not Indicated  Utilities Interventions Intervention Not Indicated Intervention Not Indicated -- Intervention Not Indicated Intervention Not Indicated --  Alcohol Usage Interventions -- -- -- Intervention Not Indicated (Score <7) Intervention Not Indicated (Score <7) --  Financial Strain Interventions -- -- -- Intervention Not Indicated Intervention Not Indicated --  Physical Activity Interventions -- -- -- Intervention Not Indicated Intervention Not Indicated --  Stress Interventions -- -- -- Intervention Not Indicated Intervention Not Indicated --  Social Connections Interventions -- -- -- Intervention Not Indicated Intervention Not Indicated --  Health Literacy Interventions -- -- -- Intervention Not Indicated -- --        Goals Addressed               This Visit's Progress     TOC- Care plan.   Patient will report no readmissions in the next 30 days. (pt-stated)        Current Barriers:  Equipment/DME new wound vac.  01/15/2024 No more wound vac. Incision is healing per surgeon. No dressing needed at this time. 01/20/2024  no longer a barrier Incision and drainage of previous lumbar surgical site- Readmission. 01/15/2024  Back incision is healing.  01/20/2024  reports back is dry. Reports she feels like she is healing. Constipation- 01/05/24: per patient report this is now resolved 01/15/2024  continues to report normal bowel movements. Continues to take colace. 01/20/2024 no longer a barrier Severe pain ( 01/15/2024)  patient was prescribed Tapentadol  ( since she is allergic to oxycondone, hydrocodone- causes itching)  Patient was prescribed  this medications on 01/14/2024 but was not able to get filled at pharmacy due to need for prior authorizations. Medical sales representative are working on this.  01/20/2024  reports that she still does not have her new pain medications. Pain 6/10 today. Continues to take tramadol for pain.   RNCM Clinical Goal(s):  Patient will work with the Care Management team over the next 30 days to address Transition of Care Barriers: Equipment/DME Patient will report normal bowel movements after surgery attend all scheduled medical appointments: PCP and specialist as evidenced by review of EMR and patient report  through collaboration with RN Care manager, provider, and care team.   Interventions: Evaluation of current treatment plan related to  self management and patient's adherence to plan as established by provider  Transitions of Care:  Goal on track:  Yes.   Reviewed pain control- 01/20/2024 placed call to [facility]. Spoke with Michala. Confrimed that RX is ready and cost is 3.24.  Spoke with patient and she wants home delivery. Pharmacy informed.  Reviewed all medications.  01/20/2024  Encouraged patient to continue to take all her medications as prescribed.  Reviewed importance of walking Reviewed fall prevention 01/20/2024 no falls.  No going up any steps due to fear of falling.  We discussed prayer and mediation to help Encouraged patient to eat more protein to aid in healing.  We discussed importance of a healthy diet.   Patient Goals/Self-Care Activities: Participate in Transition of Care Program/Attend [facility] scheduled calls Notify RN Care Manager of TOC call rescheduling needs Take all medications as prescribed Attend all scheduled provider appointments Call provider office for new concerns or questions  Continue to be mobile Continue to be cautious of falls Take new pain medication when it arrives. Be cautious the first time you take it until you see how your body reacts.  Call RN case manager Marchelle Folks if needed  Follow Up Plan:  Telephone follow up appointment with care management team member scheduled for:  01/27/2024  The patient  has been provided with contact information for the care management team and has been advised to call with any health related questions or concerns.          Plan: Telephone follow up appointment with care management team member scheduled for: 01/27/2024  Lonia Chimera, RN, BSN, CEN Population Health- Transition of Care Team.  [facility] [phone]

## 2024-01-25 ENCOUNTER — Other Ambulatory Visit: Payer: Self-pay | Admitting: Obstetrics

## 2024-01-25 DIAGNOSIS — Z1231 Encounter for screening mammogram for malignant neoplasm of breast: Secondary | ICD-10-CM

## 2024-01-27 ENCOUNTER — Other Ambulatory Visit: Payer: Self-pay

## 2024-01-27 VITALS — Wt 162.0 lb

## 2024-01-27 DIAGNOSIS — M48061 Spinal stenosis, lumbar region without neurogenic claudication: Secondary | ICD-10-CM

## 2024-01-27 DIAGNOSIS — G8918 Other acute postprocedural pain: Secondary | ICD-10-CM

## 2024-01-27 NOTE — Addendum Note (Signed)
 Addended by: Earlie Server on: 01/27/2024 11:42 AM   Modules accepted: Orders

## 2024-01-27 NOTE — Patient Instructions (Signed)
 Visit Information  Thank you for taking time to visit with me today.     Following is a copy of your care plan:   Goals Addressed               This Visit's Progress     COMPLETED: TOC- Care plan.   Patient will report no readmissions in the next 30 days. (pt-stated)        Current Barriers:  Equipment/DME new wound vac.  01/15/2024 No more wound vac. Incision is healing per surgeon. No dressing needed at this time. 01/20/2024  no longer a barrier Incision and drainage of previous lumbar surgical site- Readmission. 01/15/2024  Back incision is healing.  01/20/2024  reports back is dry. Reports she feels like she is healing. Constipation- 01/05/24: per patient report this is now resolved 01/15/2024  continues to report normal bowel movements. Continues to take colace. 01/20/2024 no longer a barrier Severe pain ( 01/15/2024)  patient was prescribed Tapentadol  ( since she is allergic to oxycondone, hydrocodone- causes itching)  Patient was prescribed this medications on 01/14/2024 but was not able to get filled at pharmacy due to need for prior authorizations. Medical sales representative are working on this. 01/20/2024  reports that she still does not have her new pain medications. Pain 6/10 today. Continues to take tramadol for pain. 01/27/2024  Patient reports that she is taking her new medication for pain ( Nucynta) reports this is working well and managing her pain. Reports she has noticed a headache in the morning.   Continues to have a decrease in appetite with an additional 6 pound weight loss.   RNCM Clinical Goal(s):  Patient will work with the Care Management team over the next 30 days to address Transition of Care Barriers: Equipment/DME Patient will report normal bowel movements after surgery attend all scheduled medical appointments: PCP and specialist as evidenced by review of EMR and patient report  through collaboration with RN Care manager, provider, and care team.    Interventions: Evaluation of current treatment plan related to  self management and patient's adherence to plan as established by provider  Transitions of Care:  Goal Met.  Today's Vitals   01/27/24 1050  Weight: 162 lb (73.5 kg)  PainSc: 7     Reviewed pain control- Confirmed patient to taking her medications for pain.  Reviewed importance of walking, encouraged patient to be as active as her MD allows.  Patient has successfully completed 30 day TOC program.  I reviewed Complex Case Management and patient has consented.  Referral placed and patient is aware she will get a call in the next 2 weeks. Encouraged patient to call me if she has concerns prior to new care manager calling her and she voiced understanding.   Patient Goals/Self-Care Activities: Participate in Transition of Care Program/Attend [facility] scheduled calls Notify RN Care Manager of TOC call rescheduling needs Take all medications as prescribed Attend all scheduled provider appointments Call provider office for new concerns or questions  Continue to be mobile- walking and move about as much as possible.  Continue to be cautious of falls Call MD if you continue to have a decreased appetite. Consider Zofran prior to meals to see if that helps with nausea.   Follow Up Plan:  Complex care manager will call you within 2 weeks for follow up.  If you have any concerns call me Lonia Chimera-- [phone]         Patient verbalizes understanding of instructions  and care plan provided today and agrees to view in MyChart. Active MyChart status and patient understanding of how to access instructions and care plan via MyChart confirmed with patient.     Complex case manager will call you within 2 weeks.     Please call the Suicide and Crisis Lifeline: 988 call the [place] National Suicide Prevention Lifeline: (530)604-3032 or TTY: (848)245-3306 TTY 347-221-8756) to talk to a trained counselor call 1-800-273-TALK (toll free, 24  hour hotline) call 911 if you are experiencing a Mental Health or Behavioral Health Crisis or need someone to talk to.  Lonia Chimera, RN, BSN, CEN [employer]- Transition of Care Team.  [facility] [phone]

## 2024-01-27 NOTE — Patient Outreach (Signed)
 Care Management  Transitions of Care Program Transitions of Care Post-discharge week 5   01/27/2024 Name: [name] MRN: [id number] DOB: 1961-10-11  Subjective: [name] is a 63 y.o. year old female who is a primary care patient of [place], Timoteo Expose, MD. The Care Management team Engaged with patient Engaged with patient by telephone to assess and address transitions of care needs.   Consent to Services:  Patient was given information about care management services, agreed to services, and gave verbal consent to participate.   Assessment: Patient reports that she continues to have severe pain. Reports she is taking her new medication.  Reports that the medication make her sleepy.           SDOH Interventions    Flowsheet Row Telephone from 12/28/2023 in [place] [facility]PART[facility] Telephone from 12/14/2023 in [place] POP[facility] DEPARTMENT Admission (Discharged) from 12/07/2023 in [place] [facility] Clinical Support from 10/02/2023 in [facility] [place] Clinical Support from 09/29/2022 in [facility] [place] Chronic Care Management from 08/05/2022 in [facility] [place]  SDOH Interventions        Food Insecurity Interventions Intervention Not Indicated Intervention Not Indicated Intervention Not Indicated Intervention Not Indicated Intervention Not Indicated --  Housing Interventions Intervention Not Indicated Intervention Not Indicated Intervention Not Indicated Intervention Not Indicated Intervention Not Indicated --  Transportation Interventions Intervention Not Indicated Intervention Not Indicated -- Intervention Not Indicated Intervention Not Indicated Intervention Not Indicated  Utilities Interventions Intervention Not Indicated Intervention Not Indicated -- Intervention Not Indicated Intervention Not Indicated --  Alcohol Usage Interventions -- -- --  Intervention Not Indicated (Score <7) Intervention Not Indicated (Score <7) --  Financial Strain Interventions -- -- -- Intervention Not Indicated Intervention Not Indicated --  Physical Activity Interventions -- -- -- Intervention Not Indicated Intervention Not Indicated --  Stress Interventions -- -- -- Intervention Not Indicated Intervention Not Indicated --  Social Connections Interventions -- -- -- Intervention Not Indicated Intervention Not Indicated --  Health Literacy Interventions -- -- -- Intervention Not Indicated -- --        Goals Addressed               This Visit's Progress     COMPLETED: TOC- Care plan.   Patient will report no readmissions in the next 30 days. (pt-stated)        Current Barriers:  Equipment/DME new wound vac.  01/15/2024 No more wound vac. Incision is healing per surgeon. No dressing needed at this time. 01/20/2024  no longer a barrier Incision and drainage of previous lumbar surgical site- Readmission. 01/15/2024  Back incision is healing.  01/20/2024  reports back is dry. Reports she feels like she is healing. Constipation- 01/05/24: per patient report this is now resolved 01/15/2024  continues to report normal bowel movements. Continues to take colace. 01/20/2024 no longer a barrier Severe pain ( 01/15/2024)  patient was prescribed Tapentadol  ( since she is allergic to oxycondone, hydrocodone- causes itching)  Patient was prescribed this medications on 01/14/2024 but was not able to get filled at pharmacy due to need for prior authorizations. Medical sales representative are working on this. 01/20/2024  reports that she still does not have her new pain medications. Pain 6/10 today. Continues to take tramadol for pain. 01/27/2024  Patient reports that she is taking her new medication for pain ( Nucynta) reports this is working well and managing  her pain. Reports she has noticed a headache in the morning.   Continues to have a decrease in appetite with an  additional 6 pound weight loss.   RNCM Clinical Goal(s):  Patient will work with the Care Management team over the next 30 days to address Transition of Care Barriers: Equipment/DME Patient will report normal bowel movements after surgery attend all scheduled medical appointments: PCP and specialist as evidenced by review of EMR and patient report  through collaboration with RN Care manager, provider, and care team.   Interventions: Evaluation of current treatment plan related to  self management and patient's adherence to plan as established by provider  Transitions of Care:  Goal Met.  Today's Vitals   01/27/24 1050  Weight: 162 lb (73.5 kg)  PainSc: 7     Reviewed pain control- Confirmed patient to taking her medications for pain.  Reviewed importance of walking, encouraged patient to be as active as her MD allows.  Patient has successfully completed 30 day TOC program.  I reviewed Complex Case Management and patient has consented.  Referral placed and patient is aware she will get a call in the next 2 weeks. Encouraged patient to call me if she has concerns prior to new care manager calling her and she voiced understanding.   Patient Goals/Self-Care Activities: Participate in Transition of Care Program/Attend [facility] scheduled calls Notify RN Care Manager of TOC call rescheduling needs Take all medications as prescribed Attend all scheduled provider appointments Call provider office for new concerns or questions  Continue to be mobile- walking and move about as much as possible.  Continue to be cautious of falls Call MD if you continue to have a decreased appetite. Consider Zofran prior to meals to see if that helps with nausea.   Follow Up Plan:  Complex care manager will call you within 2 weeks for follow up.  If you have any concerns call me Lonia Chimera-- [phone]         Plan:   Goals Addressed               This Visit's Progress     COMPLETED: TOC- Care plan.    Patient will report no readmissions in the next 30 days. (pt-stated)        Current Barriers:  Equipment/DME new wound vac.  01/15/2024 No more wound vac. Incision is healing per surgeon. No dressing needed at this time. 01/20/2024  no longer a barrier Incision and drainage of previous lumbar surgical site- Readmission. 01/15/2024  Back incision is healing.  01/20/2024  reports back is dry. Reports she feels like she is healing. Constipation- 01/05/24: per patient report this is now resolved 01/15/2024  continues to report normal bowel movements. Continues to take colace. 01/20/2024 no longer a barrier Severe pain ( 01/15/2024)  patient was prescribed Tapentadol  ( since she is allergic to oxycondone, hydrocodone- causes itching)  Patient was prescribed this medications on 01/14/2024 but was not able to get filled at pharmacy due to need for prior authorizations. Medical sales representative are working on this. 01/20/2024  reports that she still does not have her new pain medications. Pain 6/10 today. Continues to take tramadol for pain. 01/27/2024  Patient reports that she is taking her new medication for pain ( Nucynta) reports this is working well and managing her pain. Reports she has noticed a headache in the morning.   Continues to have a decrease in appetite with an additional 6 pound weight loss.  RNCM Clinical Goal(s):  Patient will work with the Care Management team over the next 30 days to address Transition of Care Barriers: Equipment/DME Patient will report normal bowel movements after surgery attend all scheduled medical appointments: PCP and specialist as evidenced by review of EMR and patient report  through collaboration with RN Care manager, provider, and care team.   Interventions: Evaluation of current treatment plan related to  self management and patient's adherence to plan as established by provider  Transitions of Care:  Goal Met.  Today's Vitals   01/27/24 1050  Weight:  162 lb (73.5 kg)  PainSc: 7     Reviewed pain control- Confirmed patient to taking her medications for pain.  Reviewed importance of walking, encouraged patient to be as active as her MD allows.  Patient has successfully completed 30 day TOC program.  I reviewed Complex Case Management and patient has consented.  Referral placed and patient is aware she will get a call in the next 2 weeks. Encouraged patient to call me if she has concerns prior to new care manager calling her and she voiced understanding.   Patient Goals/Self-Care Activities: Participate in Transition of Care Program/Attend [facility] scheduled calls Notify RN Care Manager of TOC call rescheduling needs Take all medications as prescribed Attend all scheduled provider appointments Call provider office for new concerns or questions  Continue to be mobile- walking and move about as much as possible.  Continue to be cautious of falls Call MD if you continue to have a decreased appetite. Consider Zofran prior to meals to see if that helps with nausea.   Follow Up Plan:  Complex care manager will call you within 2 weeks for follow up.  If you have any concerns call me Lonia Chimera-- [phone]        Lonia Chimera, RN, BSN, CEN Population [facility]- Transition of Care Team.  [facility] [phone]

## 2024-01-28 ENCOUNTER — Telehealth: Payer: Self-pay | Admitting: *Deleted

## 2024-01-28 NOTE — Progress Notes (Signed)
 Complex Care Management Note Care Guide Note  01/28/2024 Name: [name] MRN: [id number] DOB: 19-Aug-1961   Complex Care Management Outreach Attempts: An unsuccessful telephone outreach was attempted today to offer the patient information about available complex care management services.  Follow Up Plan:  Additional outreach attempts will be made to offer the patient complex care management information and services.   Encounter Outcome:  No Answer  Burman Nieves, CMA, [place]  [facility], [facility] Guide Direct Dial: [phone]  Fax: [phone] Website: [email or web address]

## 2024-01-28 NOTE — Progress Notes (Signed)
 Scheduled with RN for 02/09/2024

## 2024-01-29 ENCOUNTER — Other Ambulatory Visit (HOSPITAL_COMMUNITY): Payer: Self-pay

## 2024-01-29 ENCOUNTER — Ambulatory Visit: Payer: Self-pay | Admitting: *Deleted

## 2024-01-29 ENCOUNTER — Encounter: Payer: Self-pay | Admitting: Family Medicine

## 2024-01-29 ENCOUNTER — Ambulatory Visit: Admitting: Family Medicine

## 2024-01-29 VITALS — BP 130/84 | HR 89 | Temp 98.5°F | Resp 16 | Ht 65.0 in

## 2024-01-29 DIAGNOSIS — R52 Pain, unspecified: Secondary | ICD-10-CM

## 2024-01-29 DIAGNOSIS — R059 Cough, unspecified: Secondary | ICD-10-CM | POA: Diagnosis not present

## 2024-01-29 DIAGNOSIS — H1013 Acute atopic conjunctivitis, bilateral: Secondary | ICD-10-CM | POA: Diagnosis not present

## 2024-01-29 DIAGNOSIS — J301 Allergic rhinitis due to pollen: Secondary | ICD-10-CM

## 2024-01-29 MED ORDER — LEVOCETIRIZINE DIHYDROCHLORIDE 5 MG PO TABS
5.0000 mg | ORAL_TABLET | Freq: Every evening | ORAL | 0 refills | Status: DC
  Filled 2024-01-29 (×3): qty 90, 90d supply, fill #0

## 2024-01-29 MED ORDER — MOMETASONE FUROATE 50 MCG/ACT NA SUSP
2.0000 | Freq: Every day | NASAL | 3 refills | Status: AC
  Filled 2024-01-29: qty 17, 30d supply, fill #0
  Filled 2024-01-29: qty 1, fill #0
  Filled 2024-01-29: qty 17, 30d supply, fill #0
  Filled 2024-02-17 – 2024-02-22 (×2): qty 17, 30d supply, fill #1
  Filled 2024-04-06: qty 17, 30d supply, fill #2
  Filled 2024-10-07: qty 17, 30d supply, fill #3

## 2024-01-29 MED ORDER — CROMOLYN SODIUM 4 % OP SOLN
1.0000 [drp] | Freq: Four times a day (QID) | OPHTHALMIC | 1 refills | Status: DC | PRN
  Filled 2024-01-29: qty 10, 25d supply, fill #0
  Filled 2024-01-29: qty 10, 13d supply, fill #0

## 2024-01-29 NOTE — Assessment & Plan Note (Signed)
 Problem is not well-controlled and causing a lot of frustration. We discussed prognosis and treatment options. She has tried cetirizine and Allegra, reporting allergy to loratadine. Recommend trying Xyzal 5 mg daily at bedtime. Flonase nasal spray has not helped, she has been taking it for about 4 to 5 weeks.  Stop Flonase and start Nasonex nasal spray daily at bedtime for 10 to 14 days then as needed. Nasal saline irrigations every 1-2 hours. If Xyzal does not help, we could change to Singulair 10 mg daily.

## 2024-01-29 NOTE — Assessment & Plan Note (Signed)
 Pain is not well controlled and recently aggravated by sneezing and coughing. Currently on Tramadol, Lyrica,and Tapentadol. Follows with pain management.

## 2024-01-29 NOTE — Telephone Encounter (Signed)
 Chief Complaint: requesting medication for allergy sx worsening since recent back surgery and causing worsening pain Symptoms: dry cough, sneezing hurts back , red eyes , itching eyes. Reports feeling "miserable". Has tried flonase, OTC antihistamines and nothing is helping sx . Chest pain with coughing at times.  Frequency: couple of weeks  Pertinent Negatives: Patient denies no chest pain with difficulty breathing no fever Disposition: [] ED /[] Urgent Care (no appt availability in office) / [x] Appointment(In office/virtual)/ []  [place] [place] [facility]/ [] Home Care/ [] Refused Recommended Disposition /[] [place] [facility]/ []  Follow-up with PCP Additional Notes:    No available appt until 02/01/24 with PCP. Scheduled appt. Patient requesting if PCP can make any recommendations to help with sx for the weekend prior to OV Monday . Please advise.       Copied from CRM [id number]. Topic: Clinical - Red Word Triage >> Jan 29, 2024 12:02 PM Elle L wrote: Red Word that prompted transfer to Nurse Triage: The patient states she has been having allergies. She has an itchy throat and red eyes. She just had two back surgeries and the coughing and sneezing is causing her a lot of pain. She has been taking over the counter medications but they are not helping. Reason for Disposition  [1] Continuous (nonstop) coughing interferes with work or school AND [2] no improvement using cough treatment per Care Advice  Answer Assessment - Initial Assessment Questions 1. ONSET: "When did the cough begin?"      Couple of weeks  2. SEVERITY: "How bad is the cough today?"      Getting worse 3. SPUTUM: "Describe the color of your sputum" (none, dry cough; clear, white, yellow, green)     None 4. HEMOPTYSIS: "Are you coughing up any blood?" If so ask: "How much?" (flecks, streaks, tablespoons, etc.)     nana 5. DIFFICULTY BREATHING: "Are you having difficulty breathing?" If Yes, ask: "How bad is it?" (e.g., mild,  moderate, severe)    - MILD: No SOB at rest, mild SOB with walking, speaks normally in sentences, can lie down, no retractions, pulse < 100.    - MODERATE: SOB at rest, SOB with minimal exertion and prefers to sit, cannot lie down flat, speaks in phrases, mild retractions, audible wheezing, pulse 100-120.    - SEVERE: Very SOB at rest, speaks in single words, struggling to breathe, sitting hunched forward, retractions, pulse > 120      No SOB 6. FEVER: "Do you have a fever?" If Yes, ask: "What is your temperature, how was it measured, and when did it start?"     Na  7. CARDIAC HISTORY: "Do you have any history of heart disease?" (e.g., heart attack, congestive heart failure)      See hx  8. LUNG HISTORY: "Do you have any history of lung disease?"  (e.g., pulmonary embolus, asthma, emphysema)     na 9. PE RISK FACTORS: "Do you have a history of blood clots?" (or: recent major surgery, recent prolonged travel, bedridden)     na 10. OTHER SYMPTOMS: "Do you have any other symptoms?" (e.g., runny nose, wheezing, chest pain)       Cough dry sneezing, red eyes, itching eyes . Pain in back from sneezing recent back surgery 11. PREGNANCY: "Is there any chance you are pregnant?" "When was your last menstrual period?"       na 12. TRAVEL: "Have you traveled out of the country in the last month?" (e.g., travel history, exposures)  na  Protocols used: Cough - Acute Non-Productive-A-AH

## 2024-01-29 NOTE — Telephone Encounter (Signed)
 Had a cancellation at 3:30, called and spoke with pt. Moved her appt to today.

## 2024-01-29 NOTE — Patient Instructions (Addendum)
 A few things to remember from today's visit:  Allergic conjunctivitis of both eyes - Plan: levocetirizine (XYZAL ALLERGY 24HR) 5 MG tablet, cromolyn (OPTICROM) 4 % ophthalmic solution  Seasonal allergic rhinitis due to pollen - Plan: levocetirizine (XYZAL ALLERGY 24HR) 5 MG tablet, mometasone (NASONEX) 50 MCG/ACT nasal spray  Cough, unspecified type  Nasal saline irrigations every 1-2 hours. Nasonex to replace Flonase, use it daily for 10-14 days then as needed. Xyzal daily at night. Cromolyn 3-4 times daily for eye itching.  If you need refills for medications you take chronically, please call your pharmacy. Do not use My Chart to request refills or for acute issues that need immediate attention. If you send a my chart message, it may take a few days to be addressed, specially if I am not in the office.  Please be sure medication list is accurate. If a new problem present, please set up appointment sooner than planned today.

## 2024-01-29 NOTE — Progress Notes (Signed)
 ACUTE VISIT Chief Complaint  Patient presents with   Allergies    Dry cough, sneezing   HPI: [name] is a 63 y.o. female with a PMHx significant for HTN, HLD, CKD III, chronic pain disorder, MDD, and GAD, who is here today complaining of allergy symptoms.   She complains of dry cough, itchy throat, and sneezing. Also endorses rhinorrhea, nasal congestion, postnasal drainage, pruritic eyes and nose. Symptoms cause sleep interference.   She also mentions her back pain worsens significantly when she sneezes or cough.  She says this problem has been worsening since her back surgery on 12/07/2023 and gradually getting worse.  Negative for fever,sore throat, wheezing, SOB, or stridor. No sick contact.  She has been using Flonase and Allegra, and they have not helped at all. She says she is allergic to Claritin.   Chronic pain on Tramadol 50 mg bid prn,Lyrica 300 mg bid, and Tapentadol 50 qid prn. She is in constant pain, recovering from lumbar surgery 12/25/23, complicated with slow healing.  Review of Systems  Constitutional:  Positive for activity change and fatigue. Negative for appetite change.  HENT:  Negative for mouth sores, nosebleeds and trouble swallowing.   Eyes:  Positive for discharge (epiphora). Negative for photophobia, redness and visual disturbance.  Genitourinary:  Negative for decreased urine volume, dysuria and hematuria.  Musculoskeletal:  Positive for arthralgias, back pain, gait problem and myalgias.  Skin:  Negative for rash.  Neurological:  Negative for syncope and facial asymmetry.  Psychiatric/Behavioral:  Positive for sleep disturbance. Negative for confusion. The patient is nervous/anxious.   See other pertinent positives and negatives in HPI.  Current Outpatient Medications on File Prior to Visit  Medication Sig Dispense Refill   acetaminophen (TYLENOL) 500 MG tablet Take 500 mg by mouth every 6 (six) hours as needed for headache.      ALPRAZolam (XANAX) 0.5 MG tablet Take 1/2 - 1 tablet by mouth at bedtime as needed for anxiety. 30 tablet 2   amitriptyline (ELAVIL) 25 MG tablet Take 1 tablet (25 mg total) by mouth daily. 90 tablet 2   amLODipine (NORVASC) 5 MG tablet Take 1 tablet (5 mg total) by mouth daily. 90 tablet 2   atorvastatin (LIPITOR) 20 MG tablet Take 1 tablet (20 mg total) by mouth daily. 90 tablet 2   baclofen (LIORESAL) 10 MG tablet Take 1 tablet (10 mg total) by mouth 3 (three) times daily as needed. 60 each 3   betamethasone dipropionate 0.05 % cream Apply as directed to skin once a day to itchy areas only avoiding normal skin as needed 45 g 0   cholecalciferol (VITAMIN D3) 25 MCG (1000 UNIT) tablet Take 1,000 Units by mouth daily.     diphenhydrAMINE (BENADRYL) 25 MG tablet Take 25 mg by mouth daily as needed for itching.     docusate sodium (COLACE) 100 MG capsule Take 100 mg by mouth 2 (two) times daily.     DULoxetine (CYMBALTA) 30 MG capsule Take 2 capsules (60 mg total) by mouth daily. (Patient taking differently: Take 30 mg by mouth daily as needed (pain).) 180 capsule 3   Estradiol Acetate (FEMRING) 0.05 MG/24HR RING USE ONE RING VAGINALLY EVERY 3 MONTHS AS DIRECTED 1 each 4   fluconazole (DIFLUCAN) 200 MG tablet Take 1 tablet by mouth once a week 2 tablet 0   gabapentin (NEURONTIN) 600 MG tablet Take 1 tablet (600 mg total) by mouth 3 (three) times daily. 90 tablet 3   hydrALAZINE (  APRESOLINE) 50 MG tablet Take 1 tablet (50 mg total) by mouth 3 (three) times daily. (Patient taking differently: Take 50 mg by mouth 2 (two) times daily.) 270 tablet 3   methocarbamol (ROBAXIN) 500 MG tablet Take 1 tablet (500 mg total) by mouth 3 (three) times daily as needed for muscle spasm. 30 tablet 1   ondansetron (ZOFRAN) 4 MG tablet Take 1 tablet (4 mg tablet) 30 - 60 minutes each prep dose 2 tablet 0   pregabalin (LYRICA) 300 MG capsule Take 1 capsule (300 mg total) by mouth 2 (two) times daily. 60 capsule 5    spironolactone (ALDACTONE) 50 MG tablet Take 1 tablet (50 mg total) by mouth daily. 90 tablet 3   tapentadol (NUCYNTA) 50 MG tablet Take 1 tablet (50 mg total) by mouth every 6 (six) to 8 (eight) hours as needed. 30 tablet 0   terbinafine (LAMISIL) 1 % cream Apply 1 Application topically 2 (two) times daily as needed (rash).     traMADol (ULTRAM) 50 MG tablet Take 1-2 tablets (50-100 mg total) by mouth daily as needed for pain. 45 tablet 0   carvedilol (COREG) 25 MG tablet Take 1 tablet (25 mg total) by mouth 2 (two) times daily with a meal. (Patient taking differently: Take 50 mg by mouth 2 (two) times daily with a meal.) 180 tablet 3   [DISCONTINUED] omeprazole (PRILOSEC) 40 MG capsule Take 1 capsule (40 mg total) by mouth daily. 90 capsule 1   No current facility-administered medications on file prior to visit.    Past Medical History:  Diagnosis Date   Anxiety    Aortic atherosclerosis (HCC) 04/16/2021   Atypical angina (HCC)    Back pain    related to spinal stenosis and disc problem, radiates down left buttocks to leg., weakness occ.   Chest pain    a. 03/2015 Cath: nl cors; b. 03/2021 Cor CTA: Ca2+ = 0. Nl Cors.   Chronic pain syndrome    Dyspnea    hx   GERD (gastroesophageal reflux disease)    Grade I diastolic dysfunction    Headache    Hyperlipidemia    Hypertension    Lumbar post-laminectomy syndrome    LVH (left ventricular hypertrophy) 12/15/2020   a. 11/2020 Echo: EF 65-70%, no rwma, sev asymm LVH with IVSd 1.9 cm. No LVOT obs @ rest. Gr1 DD. Triv MR.   PONV (postoperative nausea and vomiting)    Pulmonary nodules    a. 03/2021 CT Chest: 3mm pulm nodules in bilat lower lobes. F/u 1 yr.   Right foot drop    Syncope    a. 11/2020 Z[facility]: No significant arrhythmias.   Vaginal foreign object    "Uses Femring"   Allergies  Allergen Reactions   Cephalosporins Anaphylaxis   Penicillins Anaphylaxis and Hives   Anesthetics, Amide Nausea And Vomiting    Does not know name of  it. States they put it on record foot center.    Betadine [Povidone Iodine] Other (See Comments)    "Skin Burn" caused scar on Left buttock   Latex Hives, Itching, Rash and Other (See Comments)   Peach [Prunus Persica] Hives   Claritin [Loratadine]     pruritis    Hydrocodone     Altered mental state, feel spaced out     Social History   Socioeconomic History   Marital status: Married    Spouse name: Cena Benton   Number of children: 1   Years of education: Not on  file   Highest education level: Not on file  Occupational History   Not on file  Tobacco Use   Smoking status: Never   Smokeless tobacco: Never  Vaping Use   Vaping status: Never Used  Substance and Sexual Activity   Alcohol use: Never   Drug use: Never   Sexual activity: Yes    Partners: Male    Birth control/protection: Post-menopausal, Surgical    Comment: Hysterectomy  Other Topics Concern   Not on file  Social History Narrative   Not on file   Social Drivers of Health   Financial Resource Strain: Low Risk  (10/02/2023)   Overall Financial Resource Strain (CARDIA)    Difficulty of Paying Living Expenses: Not hard at all  Food Insecurity: No Food Insecurity (12/28/2023)   Hunger Vital Sign    Worried About Running Out of Food in the Last Year: Never true    Ran Out of Food in the Last Year: Never true  Transportation Needs: No Transportation Needs (12/28/2023)   PRAPARE - Administrator, Civil Service (Medical): No    Lack of Transportation (Non-Medical): No  Physical Activity: Insufficiently Active (10/02/2023)   Exercise Vital Sign    Days of Exercise per Week: 2 days    Minutes of Exercise per Session: 60 min  Stress: No Stress Concern Present (10/02/2023)   [employer] of Occupational Health - Occupational Stress Questionnaire    Feeling of Stress : Not at all  Social Connections: Socially Integrated (10/02/2023)   Social Connection and Isolation Panel [NHANES]    Frequency of  Communication with Friends and Family: More than three times a week    Frequency of Social Gatherings with Friends and Family: More than three times a week    Attends Religious Services: More than 4 times per year    Active Member of [employer] or Organizations: Yes    Attends Banker Meetings: More than 4 times per year    Marital Status: Married   Vitals:   01/29/24 1523  BP: 130/84  Pulse: 89  Resp: 16  Temp: 98.5 F (36.9 C)  SpO2: 98%   Body mass index is 26.96 kg/m.  Physical Exam Vitals and nursing note reviewed.  Constitutional:      General: She is not in acute distress.    Appearance: She is well-developed. She is not ill-appearing.  HENT:     Head: Normocephalic and atraumatic.     Right Ear: Tympanic membrane, ear canal and external ear normal.     Left Ear: Tympanic membrane, ear canal and external ear normal.     Nose: Rhinorrhea present.     Right Turbinates: Enlarged.     Left Turbinates: Enlarged.     Mouth/Throat:     Mouth: Mucous membranes are moist.     Pharynx: Oropharynx is clear. Uvula midline.  Eyes:     Conjunctiva/sclera: Conjunctivae normal.     Right eye: Right conjunctiva is not injected. No exudate.    Left eye: Left conjunctiva is not injected. No exudate. Cardiovascular:     Rate and Rhythm: Normal rate and regular rhythm.     Heart sounds: No murmur heard. Pulmonary:     Effort: Pulmonary effort is normal. No respiratory distress.     Breath sounds: Normal breath sounds. No stridor.  Lymphadenopathy:     Cervical: No cervical adenopathy.  Skin:    General: Skin is warm.     Findings: No erythema  or rash.  Neurological:     Mental Status: She is alert and oriented to person, place, and time.     Comments: Unstable , antalgic assisted by a cane. Right foot drop with hard brace.   Psychiatric:        Mood and Affect: Mood is anxious. Affect is labile.   ASSESSMENT AND PLAN:  Ms. Dreier was seen today for allergy  symptoms.   Allergic conjunctivitis of both eyes Topical treatment with cromolyn sodium eyedrops up to 4 times per day. Xyzal 5 mg daily started today.  -     Levocetirizine Dihydrochloride; Take 1 tablet (5 mg total) by mouth every evening.  Dispense: 90 tablet; Refill: 0 -     Cromolyn Sodium; Place 1-2 drops into both eyes 4 (four) times daily as needed.  Dispense: 10 mL; Refill: 1  Seasonal allergic rhinitis due to pollen Assessment & Plan: Problem is not well-controlled and causing a lot of frustration. We discussed prognosis and treatment options. She has tried cetirizine and Allegra, reporting allergy to loratadine. Recommend trying Xyzal 5 mg daily at bedtime. Flonase nasal spray has not helped, she has been taking it for about 4 to 5 weeks.  Stop Flonase and start Nasonex nasal spray daily at bedtime for 10 to 14 days then as needed. Nasal saline irrigations every 1-2 hours. If Xyzal does not help, we could change to Singulair 10 mg daily.  Orders: -     Levocetirizine Dihydrochloride; Take 1 tablet (5 mg total) by mouth every evening.  Dispense: 90 tablet; Refill: 0 -     Mometasone Furoate; Place 2 sprays into the nose daily.  Dispense: 1 each; Refill: 3  Cough, unspecified type Nonproductive. History and examination do not suggest a serious process. Some of her chronic comorbidities could be continued factors, she has history of GERD and allergies. We will hold on imaging for now but if it does not resolve, we will consider.  Uncontrolled pain Assessment & Plan: Pain is not well controlled and recently aggravated by sneezing and coughing. Currently on Tramadol, Lyrica,and Tapentadol. Follows with pain management.  Return if symptoms worsen or fail to improve, for keep next appointment.  I, Rolla Etienne Wierda, acting as a scribe for Marquelle Musgrave [place], MD., have documented all relevant documentation on the behalf of Zorana Brockwell [place], MD, as directed by  Hayleen Clinkscales [place], MD while in  the presence of Challen Spainhour [place], MD.   I, Catie Chiao [place], MD, have reviewed all documentation for this visit. The documentation on 01/29/24 for the exam, diagnosis, procedures, and orders are all accurate and complete.  Daleigh Pollinger G. [place], MD  [facility]. [place] office.

## 2024-02-01 ENCOUNTER — Ambulatory Visit: Admitting: Family Medicine

## 2024-02-01 ENCOUNTER — Ambulatory Visit
Admission: RE | Admit: 2024-02-01 | Discharge: 2024-02-01 | Disposition: A | Discharge status: Home / Self Care | Source: Ambulatory Visit | Attending: Obstetrics | Admitting: Obstetrics

## 2024-02-01 DIAGNOSIS — Z1231 Encounter for screening mammogram for malignant neoplasm of breast: Secondary | ICD-10-CM | POA: Diagnosis not present

## 2024-02-03 ENCOUNTER — Encounter: Payer: PPO | Attending: Physical Medicine & Rehabilitation | Admitting: Physical Medicine & Rehabilitation

## 2024-02-03 ENCOUNTER — Encounter: Payer: Self-pay | Admitting: Physical Medicine & Rehabilitation

## 2024-02-03 VITALS — BP 116/76 | HR 83 | Ht 65.0 in | Wt 162.0 lb

## 2024-02-03 DIAGNOSIS — R269 Unspecified abnormalities of gait and mobility: Secondary | ICD-10-CM

## 2024-02-03 DIAGNOSIS — M961 Postlaminectomy syndrome, not elsewhere classified: Secondary | ICD-10-CM | POA: Diagnosis not present

## 2024-02-03 DIAGNOSIS — M5416 Radiculopathy, lumbar region: Secondary | ICD-10-CM

## 2024-02-03 NOTE — Patient Instructions (Signed)
 ALWAYS FEEL FREE TO CALL OUR OFFICE WITH ANY PROBLEMS OR QUESTIONS [phone])  **PLEASE NOTE** ALL MEDICATION REFILL REQUESTS (INCLUDING CONTROLLED SUBSTANCES) NEED TO BE MADE AT LEAST 7 DAYS PRIOR TO REFILL BEING DUE. ANY REFILL REQUESTS INSIDE THAT TIME FRAME MAY RESULT IN DELAYS IN RECEIVING YOUR PRESCRIPTION.

## 2024-02-03 NOTE — Progress Notes (Signed)
 Subjective:    Patient ID: [name], female    DOB: Jan 16, 1961, 63 y.o.   MRN: [id number]  HPI  Ms. Gu is here in follow-up of her chronic back pain and gait disorder.  Since I last saw her she had a PLIF at L4-L5 in February and then was back in the hospital due to some drainage from the wound which required an I&D and VAC placement. She is now feeling better and the back has healed. She was placed on nucynta by NS which has helped a lot with her pain as well. She alternates baclofen and robaxin for spasms. She's using cymbalta prn.   She feels that she's weaker since all of this with more difficulty advancing her right leg and more significant foot drop. Her AFO is fitting for her.      Pain Inventory Average Pain 6 Pain Right Now 7 My pain is burning and tingling  In the last 24 hours, has pain interfered with the following? General activity 4 Relation with others 3 Enjoyment of life 3 What TIME of day is your pain at its worst? daytime and evening Sleep (in general) Fair  Pain is worse with: bending Pain improves with: pacing activities and medication Relief from Meds: 8  Family History  Problem Relation Age of Onset   Heart attack Mother    Lung cancer Father    Cancer Father    Pancreatic cancer Sister    Breast cancer Sister 45   Multiple myeloma Sister    Breast cancer Sister        diagnosed in her 55's   Heart attack Sister    Throat cancer Brother    Lung cancer Brother    Stomach cancer Cousin    Colon cancer Neg Hx    Colon polyps Neg Hx    Esophageal cancer Neg Hx    Rectal cancer Neg Hx    Social History   Socioeconomic History   Marital status: Married    Spouse name: Soil scientist   Number of children: 1   Years of education: Not on file   Highest education level: Not on file  Occupational History   Not on file  Tobacco Use   Smoking status: Never   Smokeless tobacco: Never  Vaping Use   Vaping status: Never Used   Substance and Sexual Activity   Alcohol use: Never   Drug use: Never   Sexual activity: Yes    Partners: Male    Birth control/protection: Post-menopausal, Surgical    Comment: Hysterectomy  Other Topics Concern   Not on file  Social History Narrative   Not on file   Social Drivers of Health   Financial Resource Strain: Low Risk  (10/02/2023)   Overall Financial Resource Strain (CARDIA)    Difficulty of Paying Living Expenses: Not hard at all  Food Insecurity: No Food Insecurity (12/28/2023)   Hunger Vital Sign    Worried About Running Out of Food in the Last Year: Never true    Ran Out of Food in the Last Year: Never true  Transportation Needs: No Transportation Needs (12/28/2023)   PRAPARE - Administrator, Civil Service (Medical): No    Lack of Transportation (Non-Medical): No  Physical Activity: Insufficiently Active (10/02/2023)   Exercise Vital Sign    Days of Exercise per Week: 2 days    Minutes of Exercise per Session: 60 min  Stress: No Stress Concern Present (10/02/2023)  [employer] of Occupational Health - Occupational Stress Questionnaire    Feeling of Stress : Not at all  Social Connections: Socially Integrated (10/02/2023)   Social Connection and Isolation Panel [NHANES]    Frequency of Communication with Friends and Family: More than three times a week    Frequency of Social Gatherings with Friends and Family: More than three times a week    Attends Religious Services: More than 4 times per year    Active Member of [employer] or Organizations: Yes    Attends Engineer, structural: More than 4 times per year    Marital Status: Married   Past Surgical History:  Procedure Laterality Date   ABDOMINAL HYSTERECTOMY     ANTERIOR CERVICAL DECOMP/DISCECTOMY FUSION N/A 01/03/2022   Procedure: Anterior Cervical Decompression Fusion - Cervical four-Cervical five - Cervical five-Cervical six;  Surgeon: Tia Alert, MD;  Location: [facility] OR;  Service:  Neurosurgery;  Laterality: N/A;   BIOPSY  12/16/2020   Procedure: BIOPSY;  Surgeon: Meryl Dare, MD;  Location: [facility] ENDOSCOPY;  Service: Endoscopy;;   CARDIAC CATHETERIZATION N/A 04/18/2015   Procedure: Left Heart Cath and Coronary Angiography;  Surgeon: Rinaldo Cloud, MD;  Location: [facility] INVASIVE CV LAB;  Service: Cardiovascular;  Laterality: N/A;   COLONOSCOPY     COLONOSCOPY W/ BIOPSIES AND POLYPECTOMY  2018   ESOPHAGOGASTRODUODENOSCOPY (EGD) WITH PROPOFOL N/A 12/16/2020   Procedure: ESOPHAGOGASTRODUODENOSCOPY (EGD) WITH PROPOFOL;  Surgeon: Meryl Dare, MD;  Location: [facility] ENDOSCOPY;  Service: Endoscopy;  Laterality: N/A;   FOOT SURGERY Bilateral    [facility] "bunion,bone spur, tendon" (1) -6'16, (1)-10'16   HEMATOMA EVACUATION N/A 01/05/2022   Procedure: Cervical Wound Exploration;  Surgeon: Coletta Memos, MD;  Location: [facility] OR;  Service: Neurosurgery;  Laterality: N/A;   IR EPIDUROGRAPHY  07/21/2018   LUMBAR LAMINECTOMY/DECOMPRESSION MICRODISCECTOMY Bilateral 12/28/2015   Procedure: MICRO LUMBAR DECOMPRESSION L4 - L5 BILATERALLY;  Surgeon: Jene Every, MD;  Location: [facility] ORS;  Service: Orthopedics;  Laterality: Bilateral;   LUMBAR LAMINECTOMY/DECOMPRESSION MICRODISCECTOMY Bilateral 03/04/2018   Procedure: Revision of Microlumbar Decompression Bilateral Lumbar Four-Five;  Surgeon: Jene Every, MD;  Location: [facility] OR;  Service: Orthopedics;  Laterality: Bilateral;  90 mins   LUMBAR WOUND DEBRIDEMENT N/A 12/25/2023   Procedure: Irrigation and debridement of lumbar wound;  Surgeon: Arman Bogus, MD;  Location: [facility] OR;  Service: Neurosurgery;  Laterality: N/A;   POSTERIOR FUSION PEDICLE SCREW PLACEMENT N/A 12/07/2023   Procedure: LUMBAR FOUR-FIVE POSTERIOR LATERAL FUSION;  Surgeon: Arman Bogus, MD;  Location: [facility] OR;  Service: Neurosurgery;  Laterality: N/A;   SAVORY DILATION N/A 12/16/2020   Procedure: SAVORY DILATION;  Surgeon: Meryl Dare, MD;  Location: [facility]  ENDOSCOPY;  Service: Endoscopy;  Laterality: N/A;   SPINAL CORD STIMULATOR INSERTION N/A 09/28/2019   Procedure: THORACIC SPINAL CORD STIMULATOR INSERTION;  Surgeon: Venita Lick, MD;  Location: [facility] OR;  Service: Orthopedics;  Laterality: N/A;  2.5 hrs   SPINAL CORD STIMULATOR REMOVAL N/A 05/27/2021   Procedure: LUMBAR SPINAL CORD STIMULATOR REMOVAL;  Surgeon: Lucy Chris, MD;  Location: [facility] ORS;  Service: Neurosurgery;  Laterality: N/A;   TUBAL LIGATION     WISDOM TOOTH EXTRACTION     WOUND EXPLORATION N/A 03/04/2018   Procedure: EXPLORATION OF LUMBAR DECOMPRESSION WOUND;  Surgeon: Jene Every, MD;  [facility] OR;  Service: Orthopedics;  Laterality: N/A;   Past Surgical History:  Procedure Laterality Date   ABDOMINAL HYSTERECTOMY     ANTERIOR CERVICAL DECOMP/DISCECTOMY FUSION  N/A 01/03/2022   Procedure: Anterior Cervical Decompression Fusion - Cervical four-Cervical five - Cervical five-Cervical six;  Surgeon: Tia Alert, MD;  Location: [facility] OR;  Service: Neurosurgery;  Laterality: N/A;   BIOPSY  12/16/2020   Procedure: BIOPSY;  Surgeon: Meryl Dare, MD;  Location: [facility] ENDOSCOPY;  Service: Endoscopy;;   CARDIAC CATHETERIZATION N/A 04/18/2015   Procedure: Left Heart Cath and Coronary Angiography;  Surgeon: Rinaldo Cloud, MD;  Location: [facility] INVASIVE CV LAB;  Service: Cardiovascular;  Laterality: N/A;   COLONOSCOPY     COLONOSCOPY W/ BIOPSIES AND POLYPECTOMY  2018   ESOPHAGOGASTRODUODENOSCOPY (EGD) WITH PROPOFOL N/A 12/16/2020   Procedure: ESOPHAGOGASTRODUODENOSCOPY (EGD) WITH PROPOFOL;  Surgeon: Meryl Dare, MD;  Location: [facility] ENDOSCOPY;  Service: Endoscopy;  Laterality: N/A;   FOOT SURGERY Bilateral    [facility] "bunion,bone spur, tendon" (1) -6'16, (1)-10'16   HEMATOMA EVACUATION N/A 01/05/2022   Procedure: Cervical Wound Exploration;  Surgeon: Coletta Memos, MD;  Location: [facility] OR;  Service: Neurosurgery;  Laterality: N/A;   IR EPIDUROGRAPHY  07/21/2018   LUMBAR  LAMINECTOMY/DECOMPRESSION MICRODISCECTOMY Bilateral 12/28/2015   Procedure: MICRO LUMBAR DECOMPRESSION L4 - L5 BILATERALLY;  Surgeon: Jene Every, MD;  Location: [facility] ORS;  Service: Orthopedics;  Laterality: Bilateral;   LUMBAR LAMINECTOMY/DECOMPRESSION MICRODISCECTOMY Bilateral 03/04/2018   Procedure: Revision of Microlumbar Decompression Bilateral Lumbar Four-Five;  Surgeon: Jene Every, MD;  [facility] OR;  Service: Orthopedics;  Laterality: Bilateral;  90 mins   LUMBAR WOUND DEBRIDEMENT N/A 12/25/2023   Procedure: Irrigation and debridement of lumbar wound;  Surgeon: Arman Bogus, MD;  Location: [facility] OR;  Service: Neurosurgery;  Laterality: N/A;   POSTERIOR FUSION PEDICLE SCREW PLACEMENT N/A 12/07/2023   Procedure: LUMBAR FOUR-FIVE POSTERIOR LATERAL FUSION;  Surgeon: Arman Bogus, MD;  Location: [facility] OR;  Service: Neurosurgery;  Laterality: N/A;   SAVORY DILATION N/A 12/16/2020   Procedure: SAVORY DILATION;  Surgeon: Meryl Dare, MD;  Location: [facility] ENDOSCOPY;  Service: Endoscopy;  Laterality: N/A;   SPINAL CORD STIMULATOR INSERTION N/A 09/28/2019   Procedure: THORACIC SPINAL CORD STIMULATOR INSERTION;  Surgeon: Venita Lick, MD;  [facility] OR;  Service: Orthopedics;  Laterality: N/A;  2.5 hrs   SPINAL CORD STIMULATOR REMOVAL N/A 05/27/2021   Procedure: LUMBAR SPINAL CORD STIMULATOR REMOVAL;  Surgeon: Lucy Chris, MD;  Location: [facility] ORS;  Service: Neurosurgery;  Laterality: N/A;   TUBAL LIGATION     WISDOM TOOTH EXTRACTION     WOUND EXPLORATION N/A 03/04/2018   Procedure: EXPLORATION OF LUMBAR DECOMPRESSION WOUND;  Surgeon: Jene Every, MD;  [facility] OR;  Service: Orthopedics;  Laterality: N/A;   Past Medical History:  Diagnosis Date   Anxiety    Aortic atherosclerosis (HCC) 04/16/2021   Atypical angina (HCC)    Back pain    related to spinal stenosis and disc problem, radiates down left buttocks to leg., weakness occ.   Chest pain    a. 03/2015 Cath: nl  cors; b. 03/2021 Cor CTA: Ca2+ = 0. Nl Cors.   Chronic pain syndrome    Dyspnea    hx   GERD (gastroesophageal reflux disease)    Grade I diastolic dysfunction    Headache    Hyperlipidemia    Hypertension    Lumbar post-laminectomy syndrome    LVH (left ventricular hypertrophy) 12/15/2020   a. 11/2020 Echo: EF 65-70%, no rwma, sev asymm LVH with IVSd 1.9 cm. No LVOT obs @ rest. Gr1 DD. Triv MR.   PONV (postoperative nausea and  vomiting)    Pulmonary nodules    a. 03/2021 CT Chest: 3mm pulm nodules in bilat lower lobes. F/u 1 yr.   Right foot drop    Syncope    a. 11/2020 Zio: No significant arrhythmias.   Vaginal foreign object    "Uses Femring"   BP 116/76   Pulse 83   Ht 5\' 5"  (1.651 m)   Wt 162 lb (73.5 kg) Comment: Per patient - weight declined today  SpO2 98%   BMI 26.96 kg/m   Opioid Risk Score:   Fall Risk Score:  `1  Depression screen [facility] 2/9     02/03/2024   10:12 AM 10/14/2023    9:19 AM 10/02/2023   11:05 AM 09/30/2023   11:18 AM 07/29/2023    9:50 AM 04/14/2023   10:05 AM 04/01/2023    2:25 PM  Depression screen PHQ 2/9  Decreased Interest 0 0 0 0 0 0 0  Down, Depressed, Hopeless 0 0 0 0 0 0 0  PHQ - 2 Score 0 0 0 0 0 0 0  Altered sleeping  0    1   Tired, decreased energy  0    1   Change in appetite  0    0   Feeling bad or failure about yourself   0    0   Trouble concentrating  0    0   Moving slowly or fidgety/restless  0    0   Suicidal thoughts  0    0   PHQ-9 Score  0    2   Difficult doing work/chores  Not difficult at all    Somewhat difficult     Review of Systems  Musculoskeletal:  Positive for back pain and gait problem.  All other systems reviewed and are negative.      Objective:   Physical Exam General: No acute distress HEENT: NCAT, EOMI, oral membranes moist Cards: reg rate  Chest: normal effort Abdomen: Soft, NT, ND Skin: dry, intact Extremities: no edema Psych: pleasant and appropriate  Skin: intact.   Neuro: RLE remains  3-/5   with knee extension, tr-1 at ankle with DF/PF --> blue rocker AFO in place over the right ankle--fitting well . LLE 4/5 grossly.  Ongoing sensory loss distally in both legs per baseline upper extremities are 4+ to 5 out of 5 on the right and  4 out of 5 on the left with some pain inhibition.  Struggles more with left leg swing phase of gait.      Musculoskeletal: LB tender to palp. Op site CDI     Assessment & Plan:  1. Post Lumbar Laminectomy / Decompression: Lumbar Radiculopathy:             -gabapentin continue at 300mg  tid.  She is also getting Lyrica from [place] [facility]             -nucynta per NS             -use Cymbalta  60 mg daily or not at all. She will resume -blue rocker AFO helping.  Still needs to work on posture.      -referral to outpt  for gait training and RLE strengthening, neuro rehab [address] 2. left BPPV-improved             -discussed being aware of safety, intake, etc             -acclimation 3. Anxiety. PCP Following and prescribing  Alprazolam.               -buspar, xanax prn per primary 4. Spasms: may continue robaxin 5. Orthotics:             -continue AFO as above. Blue rocker is fitting well 6.  Cervical spondylosis with radiculopath at C6.                       -improved after C4-5, C5-6 ACDF 7. Right hand pain: + CTS,  -good results with release.                 -moderate 1st carpo metarcarpal OA     Twenty minutes of face to face patient care time were spent during this visit. All questions were encouraged and answered.  Follow up with me in 3 mos .

## 2024-02-03 NOTE — Progress Notes (Deleted)
 Subjective:    Patient ID: [name], female    DOB: 05-10-1961, 63 y.o.   MRN: [id number]  HPI   Pain Inventory Average Pain {NUMBERS; 0-10:5044} Pain Right Now {NUMBERS; 0-10:5044} My pain is {PAIN DESCRIPTION:21022940}  LOCATION OF PAIN  ***  BOWEL Number of stools per week: *** Oral laxative use {YES/NO:21197} Type of laxative *** Enema or suppository use {YES/NO:21197} History of colostomy {YES/NO:21197} Incontinent {YES/NO:21197}  BLADDER {bladder options:24190} In and out cath, frequency *** Able to self cath {YES/NO:21197} Bladder incontinence {YES/NO:21197} Frequent urination {YES/NO:21197} Leakage with coughing {YES/NO:21197} Difficulty starting stream {YES/NO:21197} Incomplete bladder emptying {YES/NO:21197}   Mobility {MOBILITY ZHY:86578469}  Function {FUNCTION:21022946}  Neuro/Psych {NEURO/PSYCH:21022948}  Prior Studies {CPRM PRIOR STUDIES:21022953}  Physicians involved in your care {CPRM PHYSICIANS INVOLVED IN YOUR CARE:21022954}   Family History  Problem Relation Age of Onset   Heart attack Mother    Lung cancer Father    Cancer Father    Pancreatic cancer Sister    Breast cancer Sister 64   Multiple myeloma Sister    Breast cancer Sister        diagnosed in her 77's   Heart attack Sister    Throat cancer Brother    Lung cancer Brother    Stomach cancer Cousin    Colon cancer Neg Hx    Colon polyps Neg Hx    Esophageal cancer Neg Hx    Rectal cancer Neg Hx    Social History   Socioeconomic History   Marital status: Married    Spouse name: Cena Benton   Number of children: 1   Years of education: Not on file   Highest education level: Not on file  Occupational History   Not on file  Tobacco Use   Smoking status: Never   Smokeless tobacco: Never  Vaping Use   Vaping status: Never Used  Substance and Sexual Activity   Alcohol use: Never   Drug use: Never   Sexual activity: Yes    Partners: Male     Birth control/protection: Post-menopausal, Surgical    Comment: Hysterectomy  Other Topics Concern   Not on file  Social History Narrative   Not on file   Social Drivers of Health   Financial Resource Strain: Low Risk  (10/02/2023)   Overall Financial Resource Strain (CARDIA)    Difficulty of Paying Living Expenses: Not hard at all  Food Insecurity: No Food Insecurity (12/28/2023)   Hunger Vital Sign    Worried About Running Out of Food in the Last Year: Never true    Ran Out of Food in the Last Year: Never true  Transportation Needs: No Transportation Needs (12/28/2023)   PRAPARE - Administrator, Civil Service (Medical): No    Lack of Transportation (Non-Medical): No  Physical Activity: Insufficiently Active (10/02/2023)   Exercise Vital Sign    Days of Exercise per Week: 2 days    Minutes of Exercise per Session: 60 min  Stress: No Stress Concern Present (10/02/2023)   [employer] of Occupational Health - Occupational Stress Questionnaire    Feeling of Stress : Not at all  Social Connections: Socially Integrated (10/02/2023)   Social Connection and Isolation Panel [NHANES]    Frequency of Communication with Friends and Family: More than three times a week    Frequency of Social Gatherings with Friends and Family: More than three times a week    Attends Religious Services: More than 4 times per year  Active Member of Clubs or Organizations: Yes    Attends Banker Meetings: More than 4 times per year    Marital Status: Married   Past Surgical History:  Procedure Laterality Date   ABDOMINAL HYSTERECTOMY     ANTERIOR CERVICAL DECOMP/DISCECTOMY FUSION N/A 01/03/2022   Procedure: Anterior Cervical Decompression Fusion - Cervical four-Cervical five - Cervical five-Cervical six;  Surgeon: Tia Alert, MD;  Location: [facility] OR;  Service: Neurosurgery;  Laterality: N/A;   BIOPSY  12/16/2020   Procedure: BIOPSY;  Surgeon: Meryl Dare, MD;  Location: [facility]  ENDOSCOPY;  Service: Endoscopy;;   CARDIAC CATHETERIZATION N/A 04/18/2015   Procedure: Left Heart Cath and Coronary Angiography;  Surgeon: Rinaldo Cloud, MD;  Location: [facility] INVASIVE CV LAB;  Service: Cardiovascular;  Laterality: N/A;   COLONOSCOPY     COLONOSCOPY W/ BIOPSIES AND POLYPECTOMY  2018   ESOPHAGOGASTRODUODENOSCOPY (EGD) WITH PROPOFOL N/A 12/16/2020   Procedure: ESOPHAGOGASTRODUODENOSCOPY (EGD) WITH PROPOFOL;  Surgeon: Meryl Dare, MD;  Location: [facility] ENDOSCOPY;  Service: Endoscopy;  Laterality: N/A;   FOOT SURGERY Bilateral    [facility] "bunion,bone spur, tendon" (1) -6'16, (1)-10'16   HEMATOMA EVACUATION N/A 01/05/2022   Procedure: Cervical Wound Exploration;  Surgeon: Coletta Memos, MD;  Location: [facility] OR;  Service: Neurosurgery;  Laterality: N/A;   IR EPIDUROGRAPHY  07/21/2018   LUMBAR LAMINECTOMY/DECOMPRESSION MICRODISCECTOMY Bilateral 12/28/2015   Procedure: MICRO LUMBAR DECOMPRESSION L4 - L5 BILATERALLY;  Surgeon: Jene Every, MD;  Location: [facility] ORS;  Service: Orthopedics;  Laterality: Bilateral;   LUMBAR LAMINECTOMY/DECOMPRESSION MICRODISCECTOMY Bilateral 03/04/2018   Procedure: Revision of Microlumbar Decompression Bilateral Lumbar Four-Five;  Surgeon: Jene Every, MD;  Location: [facility] OR;  Service: Orthopedics;  Laterality: Bilateral;  90 mins   LUMBAR WOUND DEBRIDEMENT N/A 12/25/2023   Procedure: Irrigation and debridement of lumbar wound;  Surgeon: Arman Bogus, MD;  Location: [facility] OR;  Service: Neurosurgery;  Laterality: N/A;   POSTERIOR FUSION PEDICLE SCREW PLACEMENT N/A 12/07/2023   Procedure: LUMBAR FOUR-FIVE POSTERIOR LATERAL FUSION;  Surgeon: Arman Bogus, MD;  Location: [facility] OR;  Service: Neurosurgery;  Laterality: N/A;   SAVORY DILATION N/A 12/16/2020   Procedure: SAVORY DILATION;  Surgeon: Meryl Dare, MD;  Location: [facility] ENDOSCOPY;  Service: Endoscopy;  Laterality: N/A;   SPINAL CORD STIMULATOR INSERTION N/A 09/28/2019   Procedure: THORACIC  SPINAL CORD STIMULATOR INSERTION;  Surgeon: Venita Lick, MD;  Location: [facility] OR;  Service: Orthopedics;  Laterality: N/A;  2.5 hrs   SPINAL CORD STIMULATOR REMOVAL N/A 05/27/2021   Procedure: LUMBAR SPINAL CORD STIMULATOR REMOVAL;  Surgeon: Lucy Chris, MD;  Location: [facility] ORS;  Service: Neurosurgery;  Laterality: N/A;   TUBAL LIGATION     WISDOM TOOTH EXTRACTION     WOUND EXPLORATION N/A 03/04/2018   Procedure: EXPLORATION OF LUMBAR DECOMPRESSION WOUND;  Surgeon: Jene Every, MD;  Location: [facility] OR;  Service: Orthopedics;  Laterality: N/A;   Past Medical History:  Diagnosis Date   Anxiety    Aortic atherosclerosis (HCC) 04/16/2021   Atypical angina (HCC)    Back pain    related to spinal stenosis and disc problem, radiates down left buttocks to leg., weakness occ.   Chest pain    a. 03/2015 Cath: nl cors; b. 03/2021 Cor CTA: Ca2+ = 0. Nl Cors.   Chronic pain syndrome    Dyspnea    hx   GERD (gastroesophageal reflux disease)    Grade I diastolic dysfunction    Headache    Hyperlipidemia  Hypertension    Lumbar post-laminectomy syndrome    LVH (left ventricular hypertrophy) 12/15/2020   a. 11/2020 Echo: EF 65-70%, no rwma, sev asymm LVH with IVSd 1.9 cm. No LVOT obs @ rest. Gr1 DD. Triv MR.   PONV (postoperative nausea and vomiting)    Pulmonary nodules    a. 03/2021 CT Chest: 3mm pulm nodules in bilat lower lobes. F/u 1 yr.   Right foot drop    Syncope    a. 11/2020 Zio: No significant arrhythmias.   Vaginal foreign object    "Uses Femring"   There were no vitals taken for this visit.  Opioid Risk Score:   Fall Risk Score:  `1  Depression screen [facility] 2/9     10/14/2023    9:19 AM 10/02/2023   11:05 AM 09/30/2023   11:18 AM 07/29/2023    9:50 AM 04/14/2023   10:05 AM 04/01/2023    2:25 PM 12/10/2022    1:40 PM  Depression screen PHQ 2/9  Decreased Interest 0 0 0 0 0 0 0  Down, Depressed, Hopeless 0 0 0 0 0 0 0  PHQ - 2 Score 0 0 0 0 0 0 0  Altered sleeping 0    1     Tired, decreased energy 0    1    Change in appetite 0    0    Feeling bad or failure about yourself  0    0    Trouble concentrating 0    0    Moving slowly or fidgety/restless 0    0    Suicidal thoughts 0    0    PHQ-9 Score 0    2    Difficult doing work/chores Not difficult at all    Somewhat difficult      Review of Systems     Objective:   Physical Exam        Assessment & Plan:

## 2024-02-09 ENCOUNTER — Other Ambulatory Visit

## 2024-02-10 ENCOUNTER — Ambulatory Visit (HOSPITAL_COMMUNITY): Attending: Cardiology

## 2024-02-10 DIAGNOSIS — I422 Other hypertrophic cardiomyopathy: Secondary | ICD-10-CM | POA: Insufficient documentation

## 2024-02-10 LAB — ECHOCARDIOGRAM COMPLETE
Area-P 1/2: 4.01 cm2
S' Lateral: 2.3 cm

## 2024-02-11 ENCOUNTER — Other Ambulatory Visit: Payer: Self-pay

## 2024-02-11 ENCOUNTER — Encounter: Payer: Self-pay | Admitting: Cardiovascular Disease

## 2024-02-11 NOTE — Patient Instructions (Signed)
 Visit Information  Thank you for taking time to visit with me today. Please don't hesitate to contact me if I can be of assistance to you before our next scheduled appointment.  Our next appointment is by telephone on Friday, May 2nd at 9:00am.  Please call the care guide team at [phone] if you need to cancel or reschedule your appointment.   Following is a copy of your care plan:   Goals Addressed             This Visit's Progress    VBCI RN Care Plan       Problems:  Chronic Disease Management support and education needs related to Back Pain  Goal: Over the next 6 months the Patient will demonstrate Improved adherence to prescribed treatment plan for back pain as evidenced by attending outpatient PT as ordered, rate back pain of 5 or less.   Interventions:   Pain Interventions: Pain assessment performed Medications reviewed Reviewed provider established plan for pain management Discussed importance of adherence to all scheduled medical appointments Counseled on the importance of reporting any/all new or changed pain symptoms or management strategies to pain management provider Discussed use of relaxation techniques and/or diversional activities to assist with pain reduction (distraction, imagery, relaxation, massage, acupressure, TENS, heat, and cold application Reviewed with patient prescribed pharmacological and nonpharmacological pain relief strategies Screening for signs and symptoms of depression related to chronic disease state  Assessed social determinant of health barriers  Patient Self-Care Activities:  Attend all scheduled provider appointments Call pharmacy for medication refills 3-7 days in advance of running out of medications Call provider office for new concerns or questions  Take medications as prescribed    Plan:  Telephone follow up appointment with care management team member scheduled for:  Friday, May 2 at 9:00am.          VBCI RN Care Plan        Problems:  No Advanced Directives in place  Goal: Over the next 6 months the Patient will collaborate with the care management team towards completion of advanced directives   as evidenced by Advanced Directives document completed and copy sent to PCP.    Interventions:   Health Maintenance Interventions: Patient interviewed about adult health maintenance status including  Advanced Directives     Patient Self-Care Activities:  Complete Advanced Directives form and provide PCP with copy.   Plan:  The patient has been provided with contact information for the care management team and has been advised to call with any health related questions or concerns.           VBCI RN Care Plan       Problems:  Chronic Disease Management support and education needs related to Frequent Falls   Goal: Over the next 1 year the Patient will demonstrate ongoing self health care management ability to avoid injury as evidenced by    no falls.   Interventions:   Falls Interventions: Provided written and verbal education re: potential causes of falls and Fall prevention strategies Reviewed medications and discussed potential side effects of medications such as dizziness and frequent urination Assessed for signs and symptoms of orthostatic hypotension  Patient Self-Care Activities:  Take extra care when getting out of bed, sit on the bed for a few minutes to allow blood pressure to stabilize before ambulating. Perform same caution when going from sitting to standing.  Use hand rails when possible, be aware of surroundings when walking.   Plan:  The patient has been provided with contact information for the care management team and has been advised to call with any health related questions or concerns.              Please call the Suicide and Crisis Lifeline: 988 if you are experiencing a Mental Health or Behavioral Health Crisis or need someone to talk to.  Patient verbalizes  understanding of instructions and care plan provided today and agrees to view in MyChart. Active MyChart status and patient understanding of how to access instructions and care plan via MyChart confirmed with patient.     [name] BSN, CCM [place]  [facility] Population Health RN Care Manager Direct Dial: [phone]  Fax: [phone]

## 2024-02-11 NOTE — Patient Outreach (Signed)
 Complex Care Management   Visit Note  02/11/2024  Name:  [name] MRN: [id number] DOB: 07/15/1961  Situation: Referral received for Complex Care Management related to  Back Pain, Falls   I obtained verbal consent from Patient.  Visit completed with patient  on the phone  Background:   Past Medical History:  Diagnosis Date   Anxiety    Aortic atherosclerosis (HCC) 04/16/2021   Atypical angina (HCC)    Back pain    related to spinal stenosis and disc problem, radiates down left buttocks to leg., weakness occ.   Chest pain    a. 03/2015 Cath: nl cors; b. 03/2021 Cor CTA: Ca2+ = 0. Nl Cors.   Chronic pain syndrome    Dyspnea    hx   GERD (gastroesophageal reflux disease)    Grade I diastolic dysfunction    Headache    Hyperlipidemia    Hypertension    Lumbar post-laminectomy syndrome    LVH (left ventricular hypertrophy) 12/15/2020   a. 11/2020 Echo: EF 65-70%, no rwma, sev asymm LVH with IVSd 1.9 cm. No LVOT obs @ rest. Gr1 DD. Triv MR.   PONV (postoperative nausea and vomiting)    Pulmonary nodules    a. 03/2021 CT Chest: 3mm pulm nodules in bilat lower lobes. F/u 1 yr.   Right foot drop    Syncope    a. 11/2020 Zio: No significant arrhythmias.   Vaginal foreign object    "Uses Femring"    Assessment: Patient Reported Symptoms:  Cognitive Cognitive Status: Normal speech and language skills, Alert and oriented to person, place, and time, Insightful and able to interpret abstract concepts      Neurological   Neurological Management Strategies:  (discussed possible medication side effects, taking care when going from lying to sitting to standing, letting her blood pressure stabilize before standing.)  HEENT HEENT Symptoms Reported: No symptoms reported      Cardiovascular Cardiovascular Symptoms Reported: No symptoms reported Does patient have uncontrolled Hypertension?: No    Respiratory Respiratory Symptoms Reported: No symptoms reported    Endocrine  Patient reports the following symptoms related to hypoglycemia or hyperglycemia : No symptoms reported Is patient diabetic?: No    Gastrointestinal Gastrointestinal Symptoms Reported: No symptoms reported (Takes Colace as needed to prevent constipation due to being on opioid as needed.)      Genitourinary      Integumentary Integumentary Symptoms Reported: No symptoms reported    Musculoskeletal Musculoskelatal Symptoms Reviewed: Unsteady gait (She will be attending outpatient PT starting 02/16/24 with [facility].  Has a hard brace to right foot, uses walker when unsteady) Musculoskeletal Conditions: Mobility limited, Unsteady gait Musculoskeletal Management Strategies: Activity, Medical device, Routine screening Musculoskeletal Self-Management Outcome: 4 (good) Falls in the past year?: Yes (last fall was December 2024 with injury to back) Number of falls in past year: 2 or more Was there an injury with Fall?: Yes Fall Risk Category Calculator: 3 Patient Fall Risk Level: High Fall Risk Patient at Risk for Falls Due to: History of fall(s), Impaired balance/gait Fall risk Follow up: Education provided  Psychosocial Psychosocial Symptoms Reported: No symptoms reported     Quality of Family Relationships: supportive, helpful Do you feel physically threatened by others?: No      02/11/2024    9:57 AM  Depression screen PHQ 2/9  Decreased Interest 0  Down, Depressed, Hopeless 0  PHQ - 2 Score 0    There were no vitals filed for this visit.  Medications Reviewed Today     Reviewed by Isadore Marble, RN (Registered Nurse) on 02/11/24 at [phone]  Med List Status: <None>   Medication Order Taking? Sig Documenting Provider Last Dose Status Informant  acetaminophen (TYLENOL) 500 MG tablet [id number] Yes Take 500 mg by mouth every 6 (six) hours as needed for headache. [provider] Taking Active Self  ALPRAZolam (XANAX) 0.5 MG tablet [id number] Yes Take 1/2 - 1 tablet by  mouth at bedtime as needed for anxiety. [place], [name], MD Taking Active Self  amitriptyline (ELAVIL) 25 MG tablet [id number] Yes Take 1 tablet (25 mg total) by mouth daily.  Taking Active Self  amLODipine (NORVASC) 5 MG tablet [id number] Yes Take 1 tablet (5 mg total) by mouth daily. Hazle Lites, MD Taking Active Self  atorvastatin (LIPITOR) 20 MG tablet [id number] Yes Take 1 tablet (20 mg total) by mouth daily. [place], [name], MD Taking Active Self  baclofen (LIORESAL) 10 MG tablet [id number] Yes Take 1 tablet (10 mg total) by mouth 3 (three) times daily as needed. Rawland Caddy, MD Taking Active Self           Med Note Shann Darnel, [place] A   Mon Nov 30, 2023  1:44 PM) Pt alternates takes baclofen and methocarbamol   betamethasone dipropionate 0.05 % cream [id number] Yes Apply as directed to skin once a day to itchy areas only avoiding normal skin as needed  Taking Active   carvedilol (COREG) 25 MG tablet [id number]  Take 1 tablet (25 mg total) by mouth 2 (two) times daily with a meal. Clearnce Curia, NP  Expired 12/25/23 2359 Self           Med Note (ROSE, AMANDA U   Fri Jan 15, 2024 10:28 AM) Continues to take  cholecalciferol (VITAMIN D3) 25 MCG (1000 UNIT) tablet [id number] Yes Take 1,000 Units by mouth daily. [provider] Taking Active Self  cromolyn (OPTICROM) 4 % ophthalmic solution [id number] Yes Place 1-2 drops into both eyes 4 (four) times daily as needed. [place], [name], MD Taking Active   diphenhydrAMINE (BENADRYL) 25 MG tablet [id number] Yes Take 25 mg by mouth daily as needed for itching. [provider] Taking Active Self  docusate sodium (COLACE) 100 MG capsule [id number] Yes Take 100 mg by mouth 2 (two) times daily. [provider] Taking Active   DULoxetine (CYMBALTA) 30 MG capsule [id number] No Take 2 capsules (60 mg total) by mouth daily.  Patient not taking: Reported on 02/11/2024   Rawland Caddy, MD Not Taking Active Self           Med Note  (ROSE, AMANDA U   Mon Dec 14, 2023  9:30 AM) Patient is taking 30 mg once a day.   Estradiol Acetate (FEMRING) 0.05 MG/24HR RING [id number] Yes USE ONE RING VAGINALLY EVERY 3 MONTHS AS DIRECTED  Taking Active Self  fluconazole (DIFLUCAN) 200 MG tablet [id number] Yes Take 1 tablet by mouth once a week  Taking Active Self  gabapentin (NEURONTIN) 600 MG tablet [id number] Yes Take 1 tablet (600 mg total) by mouth 3 (three) times daily. Rawland Caddy, MD Taking Active Self  hydrALAZINE (APRESOLINE) 50 MG tablet [id number] Yes Take 1 tablet (50 mg total) by mouth 3 (three) times daily.  Patient taking differently: Take 50 mg by mouth 2 (two) times daily.   West, Katlyn D, NP Taking Active Self           Med Note (ROSE, AMANDA  U   Mon Dec 14, 2023  9:31 AM) Reports taking 2 times per day  levocetirizine (XYZAL ALLERGY 24HR) 5 MG tablet [id number] Yes Take 1 tablet (5 mg total) by mouth every evening. [place], [name], MD Taking Active   methocarbamol (ROBAXIN) 500 MG tablet [id number] Yes Take 1 tablet (500 mg total) by mouth 3 (three) times daily as needed for muscle spasm. Meyran, Kenard Paul, NP Taking Active   mometasone (NASONEX) 50 MCG/ACT nasal spray [id number] Yes Place 2 sprays into the nose daily. [place], [name], MD Taking Active     Discontinued 09/05/23 1325 (Patient Preference)            Med Note>> Hollice Luo, CPhT   09/05/2023  1:25 PM non longer taking    ondansetron (ZOFRAN) 4 MG tablet [id number] Yes Take 1 tablet (4 mg tablet) 30 - 60 minutes each prep dose Danis, Henry L III, MD Taking Active Self           Med Note (ROSE, AMANDA U   Wed Jan 27, 2024 11:11 AM) Old RX.  No refills.  pregabalin (LYRICA) 300 MG capsule [id number]  Take 1 capsule (300 mg total) by mouth 2 (two) times daily. Cassandra Cleveland, MD  Expired 02/03/24 2359 Self  spironolactone (ALDACTONE) 50 MG tablet [id number]  Take 1 tablet (50 mg total) by mouth daily. Hazle Lites, MD  Active   tapentadol (NUCYNTA) 50  MG tablet [id number]  Take 1 tablet (50 mg total) by mouth every 6 (six) to 8 (eight) hours as needed.  Patient not taking: Reported on 02/03/2024     Active            Med Note (ROSE, AMANDA U   Fri Jan 15, 2024 10:20 AM) Patient reports medication needs a authorization.  terbinafine (LAMISIL) 1 % cream [id number]  Apply 1 Application topically 2 (two) times daily as needed (rash). [provider]  Active Self            Recommendation:   Specialty provider follow-up with Cardiology regarding Echocardiogram performed 02/10/24.  Attend Outpatient PT starting 02/16/24 as ordered by Dr. Rachel Budds on 02/03/24  Follow Up Plan:   Telephone follow up appointment date/time:  Friday, May 2 at 9:00am.   Jurline Olmsted BSN, CCM [place]  [facility] Population Health RN Care Manager Direct Dial: [phone]  Fax: [phone]

## 2024-02-12 ENCOUNTER — Other Ambulatory Visit (HOSPITAL_COMMUNITY): Payer: Self-pay

## 2024-02-12 ENCOUNTER — Other Ambulatory Visit: Payer: Self-pay

## 2024-02-15 ENCOUNTER — Other Ambulatory Visit (HOSPITAL_COMMUNITY): Payer: Self-pay

## 2024-02-16 ENCOUNTER — Ambulatory Visit: Attending: Family Medicine | Admitting: Physical Therapy

## 2024-02-16 ENCOUNTER — Encounter: Payer: Self-pay | Admitting: Physical Therapy

## 2024-02-16 DIAGNOSIS — R269 Unspecified abnormalities of gait and mobility: Secondary | ICD-10-CM | POA: Insufficient documentation

## 2024-02-16 DIAGNOSIS — M6281 Muscle weakness (generalized): Secondary | ICD-10-CM | POA: Diagnosis not present

## 2024-02-16 DIAGNOSIS — R2681 Unsteadiness on feet: Secondary | ICD-10-CM

## 2024-02-16 DIAGNOSIS — M5416 Radiculopathy, lumbar region: Secondary | ICD-10-CM | POA: Diagnosis not present

## 2024-02-16 DIAGNOSIS — R2689 Other abnormalities of gait and mobility: Secondary | ICD-10-CM

## 2024-02-16 DIAGNOSIS — M961 Postlaminectomy syndrome, not elsewhere classified: Secondary | ICD-10-CM | POA: Diagnosis not present

## 2024-02-16 NOTE — Therapy (Signed)
 OUTPATIENT PHYSICAL THERAPY NEURO EVALUATION   Patient Name: [name] MRN: [id number] DOB:September 27, 1961, 63 y.o., female Today's Date: 02/17/2024   PCP: [place], [name]., MD REFERRING PROVIDER: Rawland Caddy, MD  END OF SESSION:  PT End of Session - 02/17/24 1532     Visit Number 1    Number of Visits 17    Date for PT Re-Evaluation 04/15/24    Authorization Type [employer]    Authorization Time Period 02-16-24 -04-25-24    Progress Note Due on Visit 10    PT Start Time 1102    PT Stop Time 1146    PT Time Calculation (min) 44 min    Equipment Utilized During Treatment Gait belt    Activity Tolerance Patient limited by pain    Behavior During Therapy [facility] for tasks assessed/performed              Past Medical History:  Diagnosis Date   Anxiety    Aortic atherosclerosis (HCC) 04/16/2021   Atypical angina (HCC)    Back pain    related to spinal stenosis and disc problem, radiates down left buttocks to leg., weakness occ.   Chest pain    a. 03/2015 Cath: nl cors; b. 03/2021 Cor CTA: Ca2+ = 0. Nl Cors.   Chronic pain syndrome    Dyspnea    hx   GERD (gastroesophageal reflux disease)    Grade I diastolic dysfunction    Headache    Hyperlipidemia    Hypertension    Lumbar post-laminectomy syndrome    LVH (left ventricular hypertrophy) 12/15/2020   a. 11/2020 Echo: EF 65-70%, no rwma, sev asymm LVH with IVSd 1.9 cm. No LVOT obs @ rest. Gr1 DD. Triv MR.   PONV (postoperative nausea and vomiting)    Pulmonary nodules    a. 03/2021 CT Chest: 3mm pulm nodules in bilat lower lobes. F/u 1 yr.   Right foot drop    Syncope    a. 11/2020 Zio: No significant arrhythmias.   Vaginal foreign object    "Uses Femring "   Past Surgical History:  Procedure Laterality Date   ABDOMINAL HYSTERECTOMY     ANTERIOR CERVICAL DECOMP/DISCECTOMY FUSION N/A 01/03/2022   Procedure: Anterior Cervical Decompression Fusion - Cervical four-Cervical five - Cervical five-Cervical  six;  Surgeon: Isadora Mar, MD;  Location: [facility] OR;  Service: Neurosurgery;  Laterality: N/A;   BIOPSY  12/16/2020   Procedure: BIOPSY;  Surgeon: Asencion Blacksmith, MD;  Location: [facility] ENDOSCOPY;  Service: Endoscopy;;   CARDIAC CATHETERIZATION N/A 04/18/2015   Procedure: Left Heart Cath and Coronary Angiography;  Surgeon: Chapman Commodore, MD;  Location: [facility] INVASIVE CV LAB;  Service: Cardiovascular;  Laterality: N/A;   COLONOSCOPY     COLONOSCOPY W/ BIOPSIES AND POLYPECTOMY  2018   ESOPHAGOGASTRODUODENOSCOPY (EGD) WITH PROPOFOL  N/A 12/16/2020   Procedure: ESOPHAGOGASTRODUODENOSCOPY (EGD) WITH PROPOFOL ;  Surgeon: Asencion Blacksmith, MD;  Location: [facility] ENDOSCOPY;  Service: Endoscopy;  Laterality: N/A;   FOOT SURGERY Bilateral    [facility] "bunion,bone spur, tendon" (1) -6'16, (1)-10'16   HEMATOMA EVACUATION N/A 01/05/2022   Procedure: Cervical Wound Exploration;  Surgeon: Audie Bleacher, MD;  Location: [facility] OR;  Service: Neurosurgery;  Laterality: N/A;   IR EPIDUROGRAPHY  07/21/2018   LUMBAR LAMINECTOMY/DECOMPRESSION MICRODISCECTOMY Bilateral 12/28/2015   Procedure: MICRO LUMBAR DECOMPRESSION L4 - L5 BILATERALLY;  Surgeon: Orvan Blanch, MD;  Location: [facility] ORS;  Service: Orthopedics;  Laterality: Bilateral;   LUMBAR LAMINECTOMY/DECOMPRESSION MICRODISCECTOMY Bilateral 03/04/2018  Procedure: Revision of Microlumbar Decompression Bilateral Lumbar Four-Five;  Surgeon: Orvan Blanch, MD;  Location: [facility] OR;  Service: Orthopedics;  Laterality: Bilateral;  90 mins   LUMBAR WOUND DEBRIDEMENT N/A 12/25/2023   Procedure: Irrigation and debridement of lumbar wound;  Surgeon: Joaquin Mulberry, MD;  Location: [facility] OR;  Service: Neurosurgery;  Laterality: N/A;   POSTERIOR FUSION PEDICLE SCREW PLACEMENT N/A 12/07/2023   Procedure: LUMBAR FOUR-FIVE POSTERIOR LATERAL FUSION;  Surgeon: Joaquin Mulberry, MD;  Location: [facility] OR;  Service: Neurosurgery;  Laterality: N/A;   SAVORY DILATION N/A 12/16/2020   Procedure:  SAVORY DILATION;  Surgeon: Asencion Blacksmith, MD;  Location: [facility] ENDOSCOPY;  Service: Endoscopy;  Laterality: N/A;   SPINAL CORD STIMULATOR INSERTION N/A 09/28/2019   Procedure: THORACIC SPINAL CORD STIMULATOR INSERTION;  Surgeon: Mort Ards, MD;  Location: [facility] OR;  Service: Orthopedics;  Laterality: N/A;  2.5 hrs   SPINAL CORD STIMULATOR REMOVAL N/A 05/27/2021   Procedure: LUMBAR SPINAL CORD STIMULATOR REMOVAL;  Surgeon: Berta Brittle, MD;  Location: [facility] ORS;  Service: Neurosurgery;  Laterality: N/A;   TUBAL LIGATION     WISDOM TOOTH EXTRACTION     WOUND EXPLORATION N/A 03/04/2018   Procedure: EXPLORATION OF LUMBAR DECOMPRESSION WOUND;  Surgeon: Orvan Blanch, MD;  Location: [facility] OR;  Service: Orthopedics;  Laterality: N/A;   Patient Active Problem List   Diagnosis Date Noted   Seasonal allergic rhinitis due to pollen 01/29/2024   Delayed wound healing 12/25/2023   S/P lumbar fusion 12/07/2023   Pain in thumb joint with movement of right hand 08/12/2023   Localized primary osteoarthritis of carpometacarpal joint of right thumb 07/29/2023   Right foot drop 07/29/2023   Frequent falls 04/14/2023   Gastroesophageal reflux disease 04/14/2023   Right upper extremity numbness 09/29/2022   Stage 3a chronic kidney disease (HCC) 09/29/2022   Cervical spondylosis with radiculopathy 01/05/2022   S/P cervical spinal fusion 01/03/2022   Cervical disc disorder with radiculopathy of cervical region 11/27/2021   Uncontrolled pain 05/27/2021   Syncope 05/13/2021   Depression with anxiety 05/13/2021   Chronic diastolic CHF (congestive heart failure) (HCC) 05/13/2021   Chronic back pain 05/13/2021   Chest tightness 05/13/2021   Lower abdominal pain 05/13/2021   Syncope and collapse 01/01/2021   Abnormal echocardiogram 01/01/2021   BPPV (benign paroxysmal positional vertigo), left 12/26/2020   Odynophagia    Dysphagia 12/15/2020   Postural dizziness with presyncope 12/14/2020   Lumbar  post-laminectomy syndrome    Chronic pain 09/28/2019   Lumbar spinal stenosis    Radicular pain    Gait disorder    Difficulty in urination 07/13/2018   Back pain 07/12/2018   Hyperlipidemia 04/16/2018   GAD (generalized anxiety disorder) 04/16/2018   AKI (acute kidney injury) (HCC)    Benign essential HTN    Major depression, recurrent (HCC)    Lumbar radiculopathy 03/09/2018   Paraparesis (HCC) 03/09/2018   Essential hypertension    Chronic nonintractable headache    Leukocytosis    Acute blood loss anemia    Postoperative pain    Generalized weakness    Spinal stenosis at L4-L5 level 03/04/2018   Spinal stenosis of lumbar region 12/28/2015   Chest pain 04/16/2015    ONSET DATE: Most recent Surgery date 12-07-23:  Referral date 02-03-24  REFERRING DIAG:  M54.16 (ICD-10-CM) - Lumbar radiculopathy M96.1 (ICD-10-CM) - Lumbar post-laminectomy syndrome R26.9 (ICD-10-CM) - Gait disorder  THERAPY DIAG:  Muscle weakness (generalized)  Other abnormalities of gait and mobility  Unsteadiness on feet  Rationale for Evaluation and Treatment: Rehabilitation  SUBJECTIVE:                                                                                                                                                                                             SUBJECTIVE STATEMENT: Pt presents to PT eval using rollator and wearing Blue Rocker AFO on RLE:  underwent PLIF L4-5 on 12-06-22; underwent debridement of lumbar wound due to infection at incision site on 12-25-23; was discharged home on 12-26-23 Pt accompanied by: self  PERTINENT HISTORY: The patient was admitted on 12/07/2023 &  underwent PLIF L4-5. Hospitalized 12-07-23 - 12-11-23:  The patient was admitted on 12/25/2023 and taken to the operating room where the patient underwent irrigation and debridement of lumbar wound with primary closure and placement of external wound VAC- hospitalized 12-25-23 -  12-26-23  Frequent falls:  Spinal cord  stimulator insertion on 09-28-19; stimulator removal on 05-27-21 with resultant LLE weakness;  decompression and laminectomy on 12/28/15 at L4-L5  with redo decompression surgery on May 9,2019, post-op paraparesis with surgery to remove small hematoma; 07/12/18 fall at work (involved rolling chair) with hospital admission, transfer to CIR with d/c home 08/03/18; HTN, Bil foot surgery; left ventricular hypertrophy, chronic pain syndrome, dyspnea, hypertrophic cardiomyopathy; cervical fusion March 2023;  had carpal tunnel surgery RUE in April 2024:  LVH  PAIN:  Are you having pain? Pt rates pain 7/10 in lumbar region - greater pain in lower lumbar region near sacral area Alleviating Factor:  placing pillow behind back when sitting Aggravating Factors: certain movements    PRECAUTIONS: Fall; No bending/lifting or twisting; pt states she has a reacher  WEIGHT BEARING RESTRICTIONS: No  FALLS: Has patient fallen in last 6 months? Yes. Number of falls 3  LIVING ENVIRONMENT: Lives with: lives with their spouse Lives in: House/apartment Stairs: Yes: External: 5 steps; on right going up Has following equipment at home: Single point cane, Wheelchair (manual), and L[place]strand crutch  PLOF: Independent with basic ADLs, Independent with household mobility with device, Independent with community mobility with device, and Independent with transfers  PATIENT GOALS: improve walking and strength Rt leg  OBJECTIVE:   DIAGNOSTIC FINDINGS: IMPRESSION: 1. Interval anterior discectomy and fusion from C4 through C6 with improved patency of the spinal canal and only mild residual foraminal narrowing. 2. Stable mild spinal stenosis and mild to moderate left foraminal narrowing at C3-4. 3. Stable mild spinal stenosis and mild foraminal narrowing bilaterally at C6-7. Mild foraminal narrowing bilaterally at C7-T1. 4. No cord deformity or abnormal cord signal.  No acute findings. No recent MRI  of lumbar  region  COGNITION: Overall cognitive status: Within functional limits for tasks assessed   SENSATION: WFL  COORDINATION: Decreased RLE due to weakness and spasticity  POSTURE: No Significant postural limitations  LOWER EXTREMITY ROM:   LLE WFL's;  RLE WFL's except for ankle ROM which is minimal due to weakness; Pt has difficulty performing Rt knee extension due to Rt quad weakness and spasticity - turned body slightly to Lt to achieve more knee extension   LOWER EXTREMITY MMT:    MMT Right Eval Left Eval  Hip flexion 3- 3+   Hip extension    Hip abduction    Hip adduction    Hip internal rotation    Hip external rotation    Knee flexion 3+   Knee extension 4- 4  Ankle dorsiflexion 2-   Ankle plantarflexion    Ankle inversion    Ankle eversion    (Blank rows = not tested)  BED MOBILITY:  Modified independent  TRANSFERS: Assistive device utilized:  Rollator   Sit to stand: Modified independence Stand to sit: Modified independence   STAIRS:  Not tested due to c/o back pain   GAIT: Gait pattern: decreased step length- Right, decreased stance time- Right, decreased hip/knee flexion- Right, and decreased ankle dorsiflexion- Right Distance walked: 50' Assistive device utilized:  with rollator Level of assistance: SBA and CGA Comments: slow gait speed due to c/o back pain and RLE weakness  FUNCTIONAL TESTS:  Sit to stand score - 2 reps - with bil. UE support from chair- 29.91 secs             TUG score: - 49.19 secs with rollator  Gait velocity:  37.38 secs = .88 ft/sec with rollator  PATIENT SURVEYS:  N/A - based on diagnosis of RLE weakness due to chronic lumbar radiculopathy  TODAY'S TREATMENT: 02-16-24                                                                                                                             DATE: Reviewed previous HEP and instructed pt in appropriate exercises at this time Modified bridge exercise to gluteal isometric  contraction LAQ RLE and SAQ RLE Heel slide   PATIENT EDUCATION: Education details:  reviewed previous HEPill  Person educated: Patient Education method: Explanation - handout declined as pt stated she has it from previous OP admission Education comprehension: verbalized understanding  HOME EXERCISE PROGRAM:   GOALS: Goals reviewed with patient? Yes  SHORT TERM GOALS: Target date:  03-15-24   Pt will increase gait velocity to >/= 1.2 ft/sec with use of rollator for incr. Gait efficiency. Baseline: 37.38 secs = .88 ft/sec with rollator Goal status: INITIAL  2.  Amb. 115' with RW with SBA without requiring seated rest period. Baseline:  Goal status: INITIAL  3.  Perform 5x sit to stand with bil. UE support with pt demonstrating equal weight bearing on each leg. Baseline: 2x with bil. UE support -  due to c/o back pain Goal status: INITIAL  4.  Improved TUG score to </= 35 secs with use of rollator for improved mobility and reduced fall risk. Baseline: 49.19 secs with rollator Goal status: INITIAL  5.  Negotiate 4 steps with CGA with use of bil. Hand rails Baseline:  Goal status: INITIAL  6.  Independent in HEP for RLE strengthening.  Baseline:  Goal status: INITIAL    LONG TERM GOALS: Target date: 04-15-24  Amb. 230' with RW with SBA without requiring seated rest period. Baseline:  Goal status: INITIAL  2.  Improve TUG score to </= 28 secs  with use of RW for improved mobility and reduced fall risk. Baseline: 49.19 secs with rollator Goal status: INITIAL  3.  Perform 5x sit to stand transfers with LUE support only to demo improved strength in RLE.  Baseline:  Goal status: INITIAL  4.  Modified independent step negotiation with use of bil. Hand rails using step by step sequence. Baseline:  Goal status: INITIAL  5.  Increase gait velocity to >/= 1.6 ft/sec with rollator for increased gait efficiency with reduced fall risk.          Baseline: 37.38 secs = .88  ft/sec with rollator          Goal status:  INITIAL   6.  Independent in updated HEP for RLE strengthening. Baseline:  Goal status: INITIAL   7.  Report reduced low back pain at rest to </= 3/10 intensity.            Baseline:  7/10            Goal status:  INITIAL  ASSESSMENT:  CLINICAL IMPRESSION: Patient is a 63 y.o. lady who was seen today for physical therapy evaluation and treatment for RLE weakness and gait impairment due to chronic lumbar radiculopathy with pt s/p PLIF L4-5 on 12-07-23.  Pt presents with RLE weakness, decreased standing balance and standing tolerance with c/o low back pain.  Pt is currently using a rollator for assistance with ambulation and is wearing a Blue rocker AFO on RLE.   Pt will benefit from skilled PT to address RLE weakness, low back pain, and gait and balance deficits.     OBJECTIVE IMPAIRMENTS: Abnormal gait, decreased balance, decreased coordination, decreased endurance, decreased strength, and impaired tone.   ACTIVITY LIMITATIONS: carrying, bending, squatting, stairs, transfers, and locomotion level  PARTICIPATION LIMITATIONS: cleaning, laundry, shopping, and community activity  PERSONAL FACTORS: Behavior pattern, Past/current experiences, and Time since onset of injury/illness/exacerbation are also affecting patient's functional outcome.   REHAB POTENTIAL: Good  CLINICAL DECISION MAKING: Evolving/moderate complexity  EVALUATION COMPLEXITY: Moderate  PLAN:  PT FREQUENCY: 2x/week  PT DURATION: 8 weeks + eval   PLANNED INTERVENTIONS: Therapeutic exercises, Therapeutic activity, Neuromuscular re-education, Balance training, Gait training, Patient/Family education, Self Care, Stair training, Orthotic/Fit training, DME instructions, and Aquatic Therapy  PLAN FOR NEXT SESSION: review HEP for RLE strengthening (issued in previous OP admission and reviewed on 02-16-24 during initial eval)   Neelie Welshans, Celeste Cola, PT 02/17/2024, 3:34  PM

## 2024-02-17 ENCOUNTER — Other Ambulatory Visit (HOSPITAL_COMMUNITY): Payer: Self-pay

## 2024-02-23 DIAGNOSIS — Z6828 Body mass index (BMI) 28.0-28.9, adult: Secondary | ICD-10-CM | POA: Diagnosis not present

## 2024-02-23 DIAGNOSIS — M5416 Radiculopathy, lumbar region: Secondary | ICD-10-CM | POA: Diagnosis not present

## 2024-02-23 DIAGNOSIS — M4316 Spondylolisthesis, lumbar region: Secondary | ICD-10-CM | POA: Diagnosis not present

## 2024-02-25 ENCOUNTER — Ambulatory Visit: Attending: Family Medicine | Admitting: Physical Therapy

## 2024-02-25 DIAGNOSIS — M6281 Muscle weakness (generalized): Secondary | ICD-10-CM | POA: Insufficient documentation

## 2024-02-25 DIAGNOSIS — R2689 Other abnormalities of gait and mobility: Secondary | ICD-10-CM | POA: Insufficient documentation

## 2024-02-25 NOTE — Therapy (Signed)
 OUTPATIENT PHYSICAL THERAPY NEURO TREATMENT NOTE   Patient Name: [name] MRN: [id number] DOB:[birth year], 63 y.o., female Today's Date: 02/26/2024   PCP: [place], [name]., MD REFERRING PROVIDER: Rawland Caddy, MD  END OF SESSION:  PT End of Session - 02/26/24 7829     Visit Number 2    Number of Visits 17    Date for PT Re-Evaluation 04/15/24    Authorization Type [employer]    Authorization Time Period 02-16-24 -04-25-24    Progress Note Due on Visit 10    PT Start Time 1448    PT Stop Time 1534    PT Time Calculation (min) 46 min    Equipment Utilized During Treatment Gait belt    Activity Tolerance Patient limited by pain    Behavior During Therapy [facility] for tasks assessed/performed               Past Medical History:  Diagnosis Date   Anxiety    Aortic atherosclerosis (HCC) 04/16/2021   Atypical angina (HCC)    Back pain    related to spinal stenosis and disc problem, radiates down left buttocks to leg., weakness occ.   Chest pain    a. 03/2015 Cath: nl cors; b. 03/2021 Cor CTA: Ca2+ = 0. Nl Cors.   Chronic pain syndrome    Dyspnea    hx   GERD (gastroesophageal reflux disease)    Grade I diastolic dysfunction    Headache    Hyperlipidemia    Hypertension    Lumbar post-laminectomy syndrome    LVH (left ventricular hypertrophy) 12/15/2020   a. 11/2020 Echo: EF 65-70%, no rwma, sev asymm LVH with IVSd 1.9 cm. No LVOT obs @ rest. Gr1 DD. Triv MR.   PONV (postoperative nausea and vomiting)    Pulmonary nodules    a. 03/2021 CT Chest: 3mm pulm nodules in bilat lower lobes. F/u 1 yr.   Right foot drop    Syncope    a. 11/2020 Zio: No significant arrhythmias.   Vaginal foreign object    "Uses Femring "   Past Surgical History:  Procedure Laterality Date   ABDOMINAL HYSTERECTOMY     ANTERIOR CERVICAL DECOMP/DISCECTOMY FUSION N/A 01/03/2022   Procedure: Anterior Cervical Decompression Fusion - Cervical four-Cervical five - Cervical  five-Cervical six;  Surgeon: Isadora Mar, MD;  Location: [facility] OR;  Service: Neurosurgery;  Laterality: N/A;   BIOPSY  12/16/2020   Procedure: BIOPSY;  Surgeon: Asencion Blacksmith, MD;  Location: [facility] ENDOSCOPY;  Service: Endoscopy;;   CARDIAC CATHETERIZATION N/A 04/18/2015   Procedure: Left Heart Cath and Coronary Angiography;  Surgeon: Chapman Commodore, MD;  Location: [facility] INVASIVE CV LAB;  Service: Cardiovascular;  Laterality: N/A;   COLONOSCOPY     COLONOSCOPY W/ BIOPSIES AND POLYPECTOMY  2018   ESOPHAGOGASTRODUODENOSCOPY (EGD) WITH PROPOFOL  N/A 12/16/2020   Procedure: ESOPHAGOGASTRODUODENOSCOPY (EGD) WITH PROPOFOL ;  Surgeon: Asencion Blacksmith, MD;  Location: [facility] ENDOSCOPY;  Service: Endoscopy;  Laterality: N/A;   FOOT SURGERY Bilateral    [facility] "bunion,bone spur, tendon" (1) -6'16, (1)-10'16   HEMATOMA EVACUATION N/A 01/05/2022   Procedure: Cervical Wound Exploration;  Surgeon: Audie Bleacher, MD;  Location: [facility] OR;  Service: Neurosurgery;  Laterality: N/A;   IR EPIDUROGRAPHY  07/21/2018   LUMBAR LAMINECTOMY/DECOMPRESSION MICRODISCECTOMY Bilateral 12/28/2015   Procedure: MICRO LUMBAR DECOMPRESSION L4 - L5 BILATERALLY;  Surgeon: Orvan Blanch, MD;  Location: [facility] ORS;  Service: Orthopedics;  Laterality: Bilateral;   LUMBAR LAMINECTOMY/DECOMPRESSION MICRODISCECTOMY Bilateral 03/04/2018  Procedure: Revision of Microlumbar Decompression Bilateral Lumbar Four-Five;  Surgeon: Orvan Blanch, MD;  Location: [facility] OR;  Service: Orthopedics;  Laterality: Bilateral;  90 mins   LUMBAR WOUND DEBRIDEMENT N/A 12/25/2023   Procedure: Irrigation and debridement of lumbar wound;  Surgeon: Joaquin Mulberry, MD;  Location: [facility] OR;  Service: Neurosurgery;  Laterality: N/A;   POSTERIOR FUSION PEDICLE SCREW PLACEMENT N/A 12/07/2023   Procedure: LUMBAR FOUR-FIVE POSTERIOR LATERAL FUSION;  Surgeon: Joaquin Mulberry, MD;  Location: [facility] OR;  Service: Neurosurgery;  Laterality: N/A;   SAVORY DILATION N/A 12/16/2020    Procedure: SAVORY DILATION;  Surgeon: Asencion Blacksmith, MD;  Location: [facility] ENDOSCOPY;  Service: Endoscopy;  Laterality: N/A;   SPINAL CORD STIMULATOR INSERTION N/A 09/28/2019   Procedure: THORACIC SPINAL CORD STIMULATOR INSERTION;  Surgeon: Mort Ards, MD;  Location: [facility] OR;  Service: Orthopedics;  Laterality: N/A;  2.5 hrs   SPINAL CORD STIMULATOR REMOVAL N/A 05/27/2021   Procedure: LUMBAR SPINAL CORD STIMULATOR REMOVAL;  Surgeon: Berta Brittle, MD;  Location: [facility] ORS;  Service: Neurosurgery;  Laterality: N/A;   TUBAL LIGATION     WISDOM TOOTH EXTRACTION     WOUND EXPLORATION N/A 03/04/2018   Procedure: EXPLORATION OF LUMBAR DECOMPRESSION WOUND;  Surgeon: Orvan Blanch, MD;  Location: [facility] OR;  Service: Orthopedics;  Laterality: N/A;   Patient Active Problem List   Diagnosis Date Noted   Seasonal allergic rhinitis due to pollen 01/29/2024   Delayed wound healing 12/25/2023   S/P lumbar fusion 12/07/2023   Pain in thumb joint with movement of right hand 08/12/2023   Localized primary osteoarthritis of carpometacarpal joint of right thumb 07/29/2023   Right foot drop 07/29/2023   Frequent falls 04/14/2023   Gastroesophageal reflux disease 04/14/2023   Right upper extremity numbness 09/29/2022   Stage 3a chronic kidney disease (HCC) 09/29/2022   Cervical spondylosis with radiculopathy 01/05/2022   S/P cervical spinal fusion 01/03/2022   Cervical disc disorder with radiculopathy of cervical region 11/27/2021   Uncontrolled pain 05/27/2021   Syncope 05/13/2021   Depression with anxiety 05/13/2021   Chronic diastolic CHF (congestive heart failure) (HCC) 05/13/2021   Chronic back pain 05/13/2021   Chest tightness 05/13/2021   Lower abdominal pain 05/13/2021   Syncope and collapse 01/01/2021   Abnormal echocardiogram 01/01/2021   BPPV (benign paroxysmal positional vertigo), left 12/26/2020   Odynophagia    Dysphagia 12/15/2020   Postural dizziness with presyncope 12/14/2020   Lumbar  post-laminectomy syndrome    Chronic pain 09/28/2019   Lumbar spinal stenosis    Radicular pain    Gait disorder    Difficulty in urination 07/13/2018   Back pain 07/12/2018   Hyperlipidemia 04/16/2018   GAD (generalized anxiety disorder) 04/16/2018   AKI (acute kidney injury) (HCC)    Benign essential HTN    Major depression, recurrent (HCC)    Lumbar radiculopathy 03/09/2018   Paraparesis (HCC) 03/09/2018   Essential hypertension    Chronic nonintractable headache    Leukocytosis    Acute blood loss anemia    Postoperative pain    Generalized weakness    Spinal stenosis at L4-L5 level 03/04/2018   Spinal stenosis of lumbar region 12/28/2015   Chest pain 04/16/2015    ONSET DATE: Most recent Surgery date 12-07-23:  Referral date 02-03-24  REFERRING DIAG:  M54.16 (ICD-10-CM) - Lumbar radiculopathy M96.1 (ICD-10-CM) - Lumbar post-laminectomy syndrome R26.9 (ICD-10-CM) - Gait disorder  THERAPY DIAG:  Muscle weakness (generalized)  Other abnormalities of gait and mobility  Rationale for Evaluation and Treatment: Rehabilitation  SUBJECTIVE:                                                                                                                                                                                             SUBJECTIVE STATEMENT: Pt reports she is having some burning sensation at this time - saw Dr. Rochelle Chu on Tuesday and he said everything was healing well.  Has not done any exercises at home since initial eval last week because she just hasn't felt like it. Pt accompanied by: self  PERTINENT HISTORY: The patient was admitted on 12/07/2023 &  underwent PLIF L4-5. Hospitalized 12-07-23 - 12-11-23:  The patient was admitted on 12/25/2023 and taken to the operating room where the patient underwent irrigation and debridement of lumbar wound with primary closure and placement of external wound VAC- hospitalized 12-25-23 -  12-26-23  Frequent falls:  Spinal cord stimulator  insertion on 09-28-19; stimulator removal on 05-27-21 with resultant LLE weakness;  decompression and laminectomy on 12/28/15 at L4-L5  with redo decompression surgery on May 9,2019, post-op paraparesis with surgery to remove small hematoma; 07/12/18 fall at work (involved rolling chair) with hospital admission, transfer to CIR with d/c home 08/03/18; HTN, Bil foot surgery; left ventricular hypertrophy, chronic pain syndrome, dyspnea, hypertrophic cardiomyopathy; cervical fusion March 2023;  had carpal tunnel surgery RUE in April 2024:  LVH  PAIN:  Are you having pain? Pt rates pain 6.5 - 7/10 in lumbar region - greater pain in lower lumbar region near sacral area Alleviating Factor:  placing pillow behind back when sitting Aggravating Factors: certain movements    PRECAUTIONS: Fall; No bending/lifting or twisting; pt states she has a reacher  WEIGHT BEARING RESTRICTIONS: No  FALLS: Has patient fallen in last 6 months? Yes. Number of falls 3  LIVING ENVIRONMENT: Lives with: lives with their spouse Lives in: House/apartment Stairs: Yes: External: 5 steps; on right going up Has following equipment at home: Single point cane, Wheelchair (manual), and L[place]strand crutch  PLOF: Independent with basic ADLs, Independent with household mobility with device, Independent with community mobility with device, and Independent with transfers  PATIENT GOALS: improve walking and strength Rt leg  OBJECTIVE:   DIAGNOSTIC FINDINGS: IMPRESSION: 1. Interval anterior discectomy and fusion from C4 through C6 with improved patency of the spinal canal and only mild residual foraminal narrowing. 2. Stable mild spinal stenosis and mild to moderate left foraminal narrowing at C3-4. 3. Stable mild spinal stenosis and mild foraminal narrowing bilaterally at C6-7. Mild foraminal narrowing bilaterally at C7-T1. 4. No cord deformity or abnormal cord signal.  No acute  findings. No recent MRI of lumbar  region  COGNITION: Overall cognitive status: Within functional limits for tasks assessed   SENSATION: WFL  COORDINATION: Decreased RLE due to weakness and spasticity  POSTURE: No Significant postural limitations  LOWER EXTREMITY ROM:   LLE WFL's;  RLE WFL's except for ankle ROM which is minimal due to weakness; Pt has difficulty performing Rt knee extension due to Rt quad weakness and spasticity - turned body slightly to Lt to achieve more knee extension   LOWER EXTREMITY MMT:    MMT Right Eval Left Eval  Hip flexion 3- 3+   Hip extension    Hip abduction    Hip adduction    Hip internal rotation    Hip external rotation    Knee flexion 3+   Knee extension 4- 4  Ankle dorsiflexion 2-   Ankle plantarflexion    Ankle inversion    Ankle eversion    (Blank rows = not tested)  BED MOBILITY:  Modified independent  TRANSFERS: Assistive device utilized:  Rollator   Sit to stand: Modified independence Stand to sit: Modified independence   STAIRS:  Not tested due to c/o back pain   GAIT: Gait pattern: decreased step length- Right, decreased stance time- Right, decreased hip/knee flexion- Right, and decreased ankle dorsiflexion- Right Distance walked: 50' Assistive device utilized:  with rollator Level of assistance: SBA and CGA Comments: slow gait speed due to c/o back pain and RLE weakness  FUNCTIONAL TESTS:  Sit to stand score - 2 reps - with bil. UE support from chair- 29.91 secs             TUG score: - 49.19 secs with rollator  Gait velocity:  37.38 secs = .88 ft/sec with rollator  PATIENT SURVEYS:  N/A - based on diagnosis of RLE weakness due to chronic lumbar radiculopathy  TODAY'S TREATMENT: 02-25-24                                                                                                                           TherEx: Pelvic tilt - 10 reps with 3 sec hold Rt assisted heel slide for assisted hip and knee flexion (small range) 10 reps Rt hip  flexion in hooklying position 7 reps Rt hip abdct. In hooklying position 10 reps RLE SAQ 10 reps (no weight) RLE LAQ - seated in chair 10 reps (no resistance) Passive Rt hip flexion/extension (small range) - pt in supine - 10 reps  Pt instructed in correct technique (log rolling) for transferring from supine to sitting -rolled onto Rt side and pushed up to sitting position with CGA  SciFit level 2.0 with Ue's and LE's at end of session - 5"  TherAct: Standing closed chain exercises inside // bars - 2" step used - pt performed tap ups with LLE 10 reps for RLE weight bearing  Step ups with RLE onto 2" step - bil. UE support on // bars - 10 reps  PATIENT EDUCATION: Education details:  reviewed previous HEPill  Person educated: Patient Education method: Explanation - handout declined as pt stated she has it from previous OP admission Education comprehension: verbalized understanding  HOME EXERCISE PROGRAM:   GOALS: Goals reviewed with patient? Yes  SHORT TERM GOALS: Target date:  03-15-24   Pt will increase gait velocity to >/= 1.2 ft/sec with use of rollator for incr. Gait efficiency. Baseline: 37.38 secs = .88 ft/sec with rollator Goal status: INITIAL  2.  Amb. 115' with RW with SBA without requiring seated rest period. Baseline:  Goal status: INITIAL  3.  Perform 5x sit to stand with bil. UE support with pt demonstrating equal weight bearing on each leg. Baseline: 2x with bil. UE support - due to c/o back pain Goal status: INITIAL  4.  Improved TUG score to </= 35 secs with use of rollator for improved mobility and reduced fall risk. Baseline: 49.19 secs with rollator Goal status: INITIAL  5.  Negotiate 4 steps with CGA with use of bil. Hand rails Baseline:  Goal status: INITIAL  6.  Independent in HEP for RLE strengthening.  Baseline:  Goal status: INITIAL    LONG TERM GOALS: Target date: 04-15-24  Amb. 230' with RW with SBA without requiring seated rest  period. Baseline:  Goal status: INITIAL  2.  Improve TUG score to </= 28 secs  with use of RW for improved mobility and reduced fall risk. Baseline: 49.19 secs with rollator Goal status: INITIAL  3.  Perform 5x sit to stand transfers with LUE support only to demo improved strength in RLE.  Baseline:  Goal status: INITIAL  4.  Modified independent step negotiation with use of bil. Hand rails using step by step sequence. Baseline:  Goal status: INITIAL  5.  Increase gait velocity to >/= 1.6 ft/sec with rollator for increased gait efficiency with reduced fall risk.          Baseline: 37.38 secs = .88 ft/sec with rollator          Goal status:  INITIAL   6.  Independent in updated HEP for RLE strengthening. Baseline:  Goal status: INITIAL   7.  Report reduced low back pain at rest to </= 3/10 intensity.            Baseline:  7/10            Goal status:  INITIAL  ASSESSMENT:  CLINICAL IMPRESSION: PT session focused on RLE strengthening and gentle lumbar strengthening.  Pt continues to report pain in low back and reported a burning sensation at start of session, with intensity rated 6.5 - 7/10.  Gravity only used for strengthening exercises due to moderate c/o pain.  Pt able to perform step up exercise RLE for quad strengthening with need for bil. UE support on // bars.  Emphasized need for compliance with HEP in order for gains to be achieved with increasing RLE strength.  Cont with POC.    OBJECTIVE IMPAIRMENTS: Abnormal gait, decreased balance, decreased coordination, decreased endurance, decreased strength, and impaired tone.   ACTIVITY LIMITATIONS: carrying, bending, squatting, stairs, transfers, and locomotion level  PARTICIPATION LIMITATIONS: cleaning, laundry, shopping, and community activity  PERSONAL FACTORS: Behavior pattern, Past/current experiences, and Time since onset of injury/illness/exacerbation are also affecting patient's functional outcome.   REHAB POTENTIAL:  Good  CLINICAL DECISION MAKING: Evolving/moderate complexity  EVALUATION COMPLEXITY: Moderate  PLAN:  PT FREQUENCY: 2x/week  PT DURATION: 8 weeks + eval   PLANNED INTERVENTIONS:  Therapeutic exercises, Therapeutic activity, Neuromuscular re-education, Balance training, Gait training, Patient/Family education, Self Care, Stair training, Orthotic/Fit training, DME instructions, and Aquatic Therapy  PLAN FOR NEXT SESSION: cont RLE strengthening   Amantha Sklar, Celeste Cola, PT 02/26/2024, 6:28 PM

## 2024-02-26 ENCOUNTER — Other Ambulatory Visit: Payer: Self-pay

## 2024-02-26 ENCOUNTER — Encounter: Payer: Self-pay | Admitting: Physical Therapy

## 2024-02-26 NOTE — Patient Outreach (Signed)
 Complex Care Management   Visit Note  02/26/2024  Name:  [name] MRN: [id number] DOB: 29-Jul-1961  Situation: Referral received for Complex Care Management related to  Acute/Chronic Back Pain related to Spinal Stenosis & Disc problems, and Fall prevention  I obtained verbal consent from Patient.  Visit completed with Patient & RNCM  on the phone  Background:   Past Medical History:  Diagnosis Date   Anxiety    Aortic atherosclerosis (HCC) 04/16/2021   Atypical angina (HCC)    Back pain    related to spinal stenosis and disc problem, radiates down left buttocks to leg., weakness occ.   Chest pain    a. 03/2015 Cath: nl cors; b. 03/2021 Cor CTA: Ca2+ = 0. Nl Cors.   Chronic pain syndrome    Dyspnea    hx   GERD (gastroesophageal reflux disease)    Grade I diastolic dysfunction    Headache    Hyperlipidemia    Hypertension    Lumbar post-laminectomy syndrome    LVH (left ventricular hypertrophy) 12/15/2020   a. 11/2020 Echo: EF 65-70%, no rwma, sev asymm LVH with IVSd 1.9 cm. No LVOT obs @ rest. Gr1 DD. Triv MR.   PONV (postoperative nausea and vomiting)    Pulmonary nodules    a. 03/2021 CT Chest: 3mm pulm nodules in bilat lower lobes. F/u 1 yr.   Right foot drop    Syncope    a. 11/2020 Zio: No significant arrhythmias.   Vaginal foreign object    "Uses Femring "    Assessment: Patient Reported Symptoms:  Cognitive Cognitive Status: Alert and oriented to person, place, and time, Insightful and able to interpret abstract concepts, Normal speech and language skills   Health Maintenance Behaviors: Exercise  Neurological Neurological Review of Symptoms: No symptoms reported    HEENT HEENT Symptoms Reported: Other: Other HEENT Symptoms/Conditions: seasonal allergic rhinitis symptoms due to increased pollen count - itchy eyes, sneezing/coughing HEENT Management Strategies: Medication therapy, Coping strategies, Adequate rest HEENT Self-Management Outcome: 4  (good)    Cardiovascular Cardiovascular Symptoms Reported: Chest pain or discomfort Does patient have uncontrolled Hypertension?: Yes (BP has been higher than usual, last night BP 160/80) Is patient checking Blood Pressure at home?: Yes Patient's Recent BP reading at home: 02/25/24 before sleep = 160/80 Cardiovascular Conditions: Chest pain, Cardiomyopathy, Hypertension, Heart failure (Patient calling her cardiologist to report her chest pain (achiness that increases when she lies down) immediately after this televisit w/ RNCM) Cardiovascular Management Strategies: Medication therapy, Adequate rest, Coping strategies Cardiovascular Self-Management Outcome: 3 (uncertain) Cardiovascular Comment: calling Cardiologist to discuss chest pain and increased BP immediately after RNCM call  Respiratory Respiratory Symptoms Reported: No symptoms reported    Endocrine Patient reports the following symptoms related to hypoglycemia or hyperglycemia : No symptoms reported Is patient diabetic?: No    Gastrointestinal Gastrointestinal Symptoms Reported: No symptoms reported      Genitourinary Genitourinary Symptoms Reported: No symptoms reported    Integumentary Integumentary Symptoms Reported: No symptoms reported    Musculoskeletal Musculoskelatal Symptoms Reviewed: Unsteady gait Musculoskeletal Conditions: Mobility limited, Unsteady gait, Back pain, Other Other Musculoskeletal Conditions: Patient's back pain is related to spinal stenosis and disc problems and radiates down left buttock to left leg with occasional weakness. ALso has Right foot drop and wears a hard brace on her Right foot. Utilizes cane for balance, antalgic gait and also has walker to utilize but prefers cane. Musculoskeletal Management Strategies: Medical device, Routine screening, Exercise, Adequate rest, Coping strategies,  Medication therapy, Activity Musculoskeletal Self-Management Outcome: 4 (good) Musculoskeletal Comment: Sees PT  "[name]" for outpatient physical therapy twice weekly Falls in the past year?: Yes Number of falls in past year: 2 or more Was there an injury with Fall?: Yes Fall Risk Category Calculator: 3 Patient Fall Risk Level: High Fall Risk Patient at Risk for Falls Due to: Impaired mobility, Impaired balance/gait, History of fall(s), Impaired vision, Medication side effect, Orthopedic patient Fall risk Follow up: Education provided, Falls prevention discussed, Falls evaluation completed, Follow up appointment  Psychosocial Psychosocial Symptoms Reported: No symptoms reported Additional Psychological Details: has hx of MDD and GAD, takes Cymbalta  30mg  daily Behavioral Health Conditions: Depression, Anxiety Behavioral Management Strategies: Medication therapy, Adequate rest, Coping strategies, Support system Behavioral Health Self-Management Outcome: 4 (good) Major Change/Loss/Stressor/Fears (CP): Medical condition, self Techniques to Cope with Loss/Stress/Change: Medication, Diversional activities (has supportive husband) Quality of Family Relationships: helpful, involved, supportive Do you feel physically threatened by others?: No      02/11/2024    9:57 AM  Depression screen PHQ 2/9  Decreased Interest 0  Down, Depressed, Hopeless 0  PHQ - 2 Score 0    Vitals:   02/26/24 0934  BP: (!) 160/80    Medications Reviewed Today     Reviewed by Randye Buttner, RN (Registered Nurse) on 02/26/24 at 1009  Med List Status: <None>   Medication Order Taking? Sig Documenting Provider Last Dose Status Informant  acetaminophen  (TYLENOL ) 500 MG tablet [id number] Yes Take 500 mg by mouth every 6 (six) hours as needed for headache. [provider] Taking Active Self  ALPRAZolam  (XANAX ) 0.5 MG tablet [id number] Yes Take 1/2 - 1 tablet by mouth at bedtime as needed for anxiety. [place], [name], MD Taking Active Self  amitriptyline  (ELAVIL ) 25 MG tablet [id number] Yes Take 1 tablet (25 mg total)  by mouth daily.  Taking Active Self  amLODipine  (NORVASC ) 5 MG tablet [id number] Yes Take 1 tablet (5 mg total) by mouth daily. Hazle Lites, MD Taking Active Self  atorvastatin  (LIPITOR) 20 MG tablet [id number] Yes Take 1 tablet (20 mg total) by mouth daily. [place], [name], MD Taking Active Self  baclofen  (LIORESAL ) 10 MG tablet [id number] Yes Take 1 tablet (10 mg total) by mouth 3 (three) times daily as needed. Rawland Caddy, MD Taking Active Self           Med Note Shann Darnel, [place] A   Mon Nov 30, 2023  1:44 PM) Pt alternates takes baclofen  and methocarbamol    betamethasone  dipropionate 0.05 % cream [id number] Yes Apply as directed to skin once a day to itchy areas only avoiding normal skin as needed  Taking Active   carvedilol  (COREG ) 25 MG tablet [id number]  Take 1 tablet (25 mg total) by mouth 2 (two) times daily with a meal. Clearnce Curia, NP  Expired 12/25/23 2359 Self           Med Note (ROSE, AMANDA U   Fri Jan 15, 2024 10:28 AM) Continues to take  cholecalciferol  (VITAMIN D3) 25 MCG (1000 UNIT) tablet [id number] Yes Take 1,000 Units by mouth daily. [provider] Taking Active Self  cromolyn  (OPTICROM ) 4 % ophthalmic solution [id number] Yes Place 1-2 drops into both eyes 4 (four) times daily as needed. [place], [name], MD Taking Active   diphenhydrAMINE  (BENADRYL ) 25 MG tablet [id number] Yes Take 25 mg by mouth daily as needed for itching. [provider] Taking Active Self  docusate sodium  (COLACE) 100 MG  capsule [id number] Yes Take 100 mg by mouth 2 (two) times daily. [provider] Taking Active   DULoxetine  (CYMBALTA ) 30 MG capsule [id number] Yes Take 2 capsules (60 mg total) by mouth daily. Rawland Caddy, MD Taking Active Self           Med Note (ROSE, Minna Amass Dec 14, 2023  9:30 AM) Patient is taking 30 mg once a day.   Estradiol  Acetate (FEMRING ) 0.05 MG/24HR RING [id number] Yes USE ONE RING VAGINALLY EVERY 3 MONTHS AS DIRECTED  Taking Active  Self  fluconazole  (DIFLUCAN ) 200 MG tablet [id number] Yes Take 1 tablet by mouth once a week  Taking Active Self  gabapentin  (NEURONTIN ) 600 MG tablet [id number] Yes Take 1 tablet (600 mg total) by mouth 3 (three) times daily. Rawland Caddy, MD Taking Active Self  hydrALAZINE  (APRESOLINE ) 50 MG tablet [id number]  Take 1 tablet (50 mg total) by mouth 3 (three) times daily.  Patient taking differently: Take 50 mg by mouth 2 (two) times daily.   [place], Katlyn D, NP  Expired 02/11/24 2359 Self           Med Note (ROSE, AMANDA U   Mon Dec 14, 2023  9:31 AM) Reports taking 2 times per day  levocetirizine (XYZAL  ALLERGY 24HR) 5 MG tablet [id number] No Take 1 tablet (5 mg total) by mouth every evening.  Patient not taking: Reported on 02/26/2024   [place], [name], MD Not Taking Active            Med Note Saverio Curling, Exilda Wilhite A   Fri Feb 26, 2024  9:13 AM) Had allergic reaction, itched all over body  methocarbamol  (ROBAXIN ) 500 MG tablet [id number] Yes Take 1 tablet (500 mg total) by mouth 3 (three) times daily as needed for muscle spasm. Meyran, Kimberly Hannah, NP Taking Active   mometasone  (NASONEX ) 50 MCG/ACT nasal spray [id number] Yes Place 2 sprays into the nose daily. [place], [name], MD Taking Active     Discontinued 09/05/23 1325 (Patient Preference)            Med Note>> Hollice Luo, CPhT   09/05/2023  1:25 PM non longer taking    ondansetron  (ZOFRAN ) 4 MG tablet [id number]  Take 1 tablet (4 mg tablet) 30 - 60 minutes each prep dose Danis, Henry L III, MD  Active Self           Med Note (ROSE, AMANDA U   Wed Jan 27, 2024 11:11 AM) Old RX.  No refills.  pregabalin  (LYRICA ) 300 MG capsule [id number]  Take 1 capsule (300 mg total) by mouth 2 (two) times daily. Cassandra Cleveland, MD  Expired 02/03/24 2359 Self  spironolactone  (ALDACTONE ) 50 MG tablet [id number] Yes Take 1 tablet (50 mg total) by mouth daily. Hazle Lites, MD Taking Active   tapentadol  (NUCYNTA ) 50 MG tablet [id number] Yes Take 1 tablet (50  mg total) by mouth every 6 (six) to 8 (eight) hours as needed.  Taking Active            Med Note Saverio Curling, Arthor Gorter A   Fri Feb 26, 2024  9:10 AM) Did obtain prior auth  terbinafine (LAMISIL) 1 % cream [id number] Yes Apply 1 Application topically 2 (two) times daily as needed (rash). [provider] Taking Active Self            Recommendation:  Following this CCM phone call, IMMEDIATE follow up phone call to be made by patient to her  Cardiologist regarding increased BP (160/80 on 02/25/24 pm) and complaints of chest pain, worse with lying down   PCP Follow-up scheduled for 6/18 /25  Follow Up Plan:   Telephone follow up appointment date/time:  03/11/24 @ 9:30 am  Eliz Nigg A. Saverio Curling RN, BA, [facility], CRRN [place]  [facility] Population Health RN Care Manager Direct Dial: [phone]  Fax: [phone]

## 2024-02-26 NOTE — Patient Instructions (Signed)
 Visit Information  Dear [name],  Thank you for taking time to visit with me today. Please don't hesitate to contact me if I can be of assistance to you before our next scheduled telephone appointment.   I am relieved to know that you are calling your Cardiologist immediately following our call regarding your complaint of chest pain which worsens when lying down, and to report your recent Blood Pressures that are higher than normal.  Our next appointment is by telephone on 03/11/2024 at 9:30 am.  Warm regards,  Bartholomew Light  Following is a copy of your care plan:   Goals Addressed             This Visit's Progress    VBCI RN Care Plan   No change    Problems:  Chronic Disease Management support and education needs related to Back Pain  Goal: Over the next 6 months the Patient will demonstrate Improved adherence to prescribed treatment plan for back pain as evidenced by attending outpatient PT as ordered, rate back pain of 5 or less. 02/26/24 televisit w/ RNCM, pain remains higher than desired, at 7 out of pain scale 0-10.  Interventions:   Pain Interventions: Pain assessment performed Medications reviewed Reviewed provider established plan for pain management Discussed importance of adherence to all scheduled medical appointments Counseled on the importance of reporting any/all new or changed pain symptoms or management strategies to pain management provider Discussed use of relaxation techniques and/or diversional activities to assist with pain reduction (distraction, imagery, relaxation, massage, acupressure, TENS, heat, and cold application Reviewed with patient prescribed pharmacological and nonpharmacological pain relief strategies Screening for signs and symptoms of depression related to chronic disease state  Assessed social determinant of health barriers  Patient Self-Care Activities:  Attend all scheduled provider appointments Call pharmacy for medication refills  3-7 days in advance of running out of medications Call provider office for new concerns or questions  Take medications as prescribed    Plan:  Telephone follow up appointment with care management team member scheduled for:  Friday, May 16 at 9:30am.          VBCI RN Care Plan   Improving    Problems:  Chronic Disease Management support and education needs related to Frequent Falls   Goal: Over the next 1 year the Patient will demonstrate ongoing self health care management ability to avoid injury as evidenced by    no falls.   Interventions:   Falls Interventions: Provided written and verbal education re: potential causes of falls and Fall prevention strategies Reviewed medications and discussed potential side effects of medications such as dizziness and frequent urination Assessed for signs and symptoms of orthostatic hypotension  Patient Self-Care Activities:  Take extra care when getting out of bed, sit on the bed for a few minutes to allow blood pressure to stabilize before ambulating. Perform same caution when going from sitting to standing.  Use hand rails when possible, be aware of surroundings when walking.   Plan:  The patient has been provided with contact information for the care management team and has been advised to call with any health related questions or concerns.  Next follow up appointment with RNCM is 03/24/24 @ 9:30 am Next follow up appointment with PCP is 04/13/24             Patient verbalizes understanding of instructions and care plan provided today and agrees to view in MyChart. Active MyChart status and patient understanding of how to  access instructions and care plan via MyChart confirmed with patient.     The patient has been provided with contact information for the care management team and has been advised to call with any health related questions or concerns.   Please call the care guide team at [phone] if you need to cancel or  reschedule your appointment.   Please call 1-800-273-TALK (toll free, 24 hour hotline) if you are experiencing a Mental Health or Behavioral Health Crisis or need someone to talk to.  [name] A. Cheryl Curling RN, BA, [facility], CRRN [place]  [facility] Population Health RN Care Manager Direct Dial: [phone]  Fax: [phone]

## 2024-03-01 ENCOUNTER — Ambulatory Visit: Admitting: Physical Therapy

## 2024-03-01 ENCOUNTER — Encounter: Payer: Self-pay | Admitting: Physical Therapy

## 2024-03-01 DIAGNOSIS — R2689 Other abnormalities of gait and mobility: Secondary | ICD-10-CM

## 2024-03-01 DIAGNOSIS — M6281 Muscle weakness (generalized): Secondary | ICD-10-CM | POA: Diagnosis not present

## 2024-03-01 NOTE — Therapy (Unsigned)
 OUTPATIENT PHYSICAL THERAPY NEURO TREATMENT NOTE   Patient Name: [name] MRN: [id number] DOB:04-28-1961, 63 y.o., female Today's Date: 03/02/2024   PCP: [place], [name]., MD REFERRING PROVIDER: Rawland Caddy, MD  END OF SESSION:  PT End of Session - 03/02/24 1048     Visit Number 3    Number of Visits 17    Date for PT Re-Evaluation 04/15/24    Authorization Type [employer]    Authorization Time Period 02-16-24 -04-25-24    Progress Note Due on Visit 10    PT Start Time 0846    PT Stop Time 0932    PT Time Calculation (min) 46 min    Equipment Utilized During Treatment Gait belt    Activity Tolerance Patient limited by pain    Behavior During Therapy [facility] for tasks assessed/performed                Past Medical History:  Diagnosis Date   Anxiety    Aortic atherosclerosis (HCC) 04/16/2021   Atypical angina (HCC)    Back pain    related to spinal stenosis and disc problem, radiates down left buttocks to leg., weakness occ.   Chest pain    a. 03/2015 Cath: nl cors; b. 03/2021 Cor CTA: Ca2+ = 0. Nl Cors.   Chronic pain syndrome    Dyspnea    hx   GERD (gastroesophageal reflux disease)    Grade I diastolic dysfunction    Headache    Hyperlipidemia    Hypertension    Lumbar post-laminectomy syndrome    LVH (left ventricular hypertrophy) 12/15/2020   a. 11/2020 Echo: EF 65-70%, no rwma, sev asymm LVH with IVSd 1.9 cm. No LVOT obs @ rest. Gr1 DD. Triv MR.   PONV (postoperative nausea and vomiting)    Pulmonary nodules    a. 03/2021 CT Chest: 3mm pulm nodules in bilat lower lobes. F/u 1 yr.   Right foot drop    Syncope    a. 11/2020 Zio: No significant arrhythmias.   Vaginal foreign object    "Uses Femring "   Past Surgical History:  Procedure Laterality Date   ABDOMINAL HYSTERECTOMY     ANTERIOR CERVICAL DECOMP/DISCECTOMY FUSION N/A 01/03/2022   Procedure: Anterior Cervical Decompression Fusion - Cervical four-Cervical five - Cervical  five-Cervical six;  Surgeon: Isadora Mar, MD;  Location: [facility] OR;  Service: Neurosurgery;  Laterality: N/A;   BIOPSY  12/16/2020   Procedure: BIOPSY;  Surgeon: Asencion Blacksmith, MD;  Location: [facility] ENDOSCOPY;  Service: Endoscopy;;   CARDIAC CATHETERIZATION N/A 04/18/2015   Procedure: Left Heart Cath and Coronary Angiography;  Surgeon: Chapman Commodore, MD;  Location: [facility] INVASIVE CV LAB;  Service: Cardiovascular;  Laterality: N/A;   COLONOSCOPY     COLONOSCOPY W/ BIOPSIES AND POLYPECTOMY  2018   ESOPHAGOGASTRODUODENOSCOPY (EGD) WITH PROPOFOL  N/A 12/16/2020   Procedure: ESOPHAGOGASTRODUODENOSCOPY (EGD) WITH PROPOFOL ;  Surgeon: Asencion Blacksmith, MD;  Location: [facility] ENDOSCOPY;  Service: Endoscopy;  Laterality: N/A;   FOOT SURGERY Bilateral    [facility] "bunion,bone spur, tendon" (1) -6'16, (1)-10'16   HEMATOMA EVACUATION N/A 01/05/2022   Procedure: Cervical Wound Exploration;  Surgeon: Audie Bleacher, MD;  Location: [facility] OR;  Service: Neurosurgery;  Laterality: N/A;   IR EPIDUROGRAPHY  07/21/2018   LUMBAR LAMINECTOMY/DECOMPRESSION MICRODISCECTOMY Bilateral 12/28/2015   Procedure: MICRO LUMBAR DECOMPRESSION L4 - L5 BILATERALLY;  Surgeon: Orvan Blanch, MD;  Location: [facility] ORS;  Service: Orthopedics;  Laterality: Bilateral;   LUMBAR LAMINECTOMY/DECOMPRESSION MICRODISCECTOMY Bilateral  03/04/2018   Procedure: Revision of Microlumbar Decompression Bilateral Lumbar Four-Five;  Surgeon: Orvan Blanch, MD;  Location: [facility] OR;  Service: Orthopedics;  Laterality: Bilateral;  90 mins   LUMBAR WOUND DEBRIDEMENT N/A 12/25/2023   Procedure: Irrigation and debridement of lumbar wound;  Surgeon: Joaquin Mulberry, MD;  Location: [facility] OR;  Service: Neurosurgery;  Laterality: N/A;   POSTERIOR FUSION PEDICLE SCREW PLACEMENT N/A 12/07/2023   Procedure: LUMBAR FOUR-FIVE POSTERIOR LATERAL FUSION;  Surgeon: Joaquin Mulberry, MD;  Location: [facility] OR;  Service: Neurosurgery;  Laterality: N/A;   SAVORY DILATION N/A 12/16/2020    Procedure: SAVORY DILATION;  Surgeon: Asencion Blacksmith, MD;  Location: [facility] ENDOSCOPY;  Service: Endoscopy;  Laterality: N/A;   SPINAL CORD STIMULATOR INSERTION N/A 09/28/2019   Procedure: THORACIC SPINAL CORD STIMULATOR INSERTION;  Surgeon: Mort Ards, MD;  Location: [facility] OR;  Service: Orthopedics;  Laterality: N/A;  2.5 hrs   SPINAL CORD STIMULATOR REMOVAL N/A 05/27/2021   Procedure: LUMBAR SPINAL CORD STIMULATOR REMOVAL;  Surgeon: Berta Brittle, MD;  Location: [facility] ORS;  Service: Neurosurgery;  Laterality: N/A;   TUBAL LIGATION     WISDOM TOOTH EXTRACTION     WOUND EXPLORATION N/A 03/04/2018   Procedure: EXPLORATION OF LUMBAR DECOMPRESSION WOUND;  Surgeon: Orvan Blanch, MD;  Location: [facility] OR;  Service: Orthopedics;  Laterality: N/A;   Patient Active Problem List   Diagnosis Date Noted   Seasonal allergic rhinitis due to pollen 01/29/2024   Delayed wound healing 12/25/2023   S/P lumbar fusion 12/07/2023   Pain in thumb joint with movement of right hand 08/12/2023   Localized primary osteoarthritis of carpometacarpal joint of right thumb 07/29/2023   Right foot drop 07/29/2023   Frequent falls 04/14/2023   Gastroesophageal reflux disease 04/14/2023   Right upper extremity numbness 09/29/2022   Stage 3a chronic kidney disease (HCC) 09/29/2022   Cervical spondylosis with radiculopathy 01/05/2022   S/P cervical spinal fusion 01/03/2022   Cervical disc disorder with radiculopathy of cervical region 11/27/2021   Uncontrolled pain 05/27/2021   Syncope 05/13/2021   Depression with anxiety 05/13/2021   Chronic diastolic CHF (congestive heart failure) (HCC) 05/13/2021   Chronic back pain 05/13/2021   Chest tightness 05/13/2021   Lower abdominal pain 05/13/2021   Syncope and collapse 01/01/2021   Abnormal echocardiogram 01/01/2021   BPPV (benign paroxysmal positional vertigo), left 12/26/2020   Odynophagia    Dysphagia 12/15/2020   Postural dizziness with presyncope 12/14/2020   Lumbar  post-laminectomy syndrome    Chronic pain 09/28/2019   Lumbar spinal stenosis    Radicular pain    Gait disorder    Difficulty in urination 07/13/2018   Back pain 07/12/2018   Hyperlipidemia 04/16/2018   GAD (generalized anxiety disorder) 04/16/2018   AKI (acute kidney injury) (HCC)    Benign essential HTN    Major depression, recurrent (HCC)    Lumbar radiculopathy 03/09/2018   Paraparesis (HCC) 03/09/2018   Essential hypertension    Chronic nonintractable headache    Leukocytosis    Acute blood loss anemia    Postoperative pain    Generalized weakness    Spinal stenosis at L4-L5 level 03/04/2018   Spinal stenosis of lumbar region 12/28/2015   Chest pain 04/16/2015    ONSET DATE: Most recent Surgery date 12-07-23:  Referral date 02-03-24  REFERRING DIAG:  M54.16 (ICD-10-CM) - Lumbar radiculopathy M96.1 (ICD-10-CM) - Lumbar post-laminectomy syndrome R26.9 (ICD-10-CM) - Gait disorder  THERAPY DIAG:  Muscle weakness (generalized)  Other abnormalities of gait  and mobility  Rationale for Evaluation and Treatment: Rehabilitation  SUBJECTIVE:                                                                                                                                                                                             SUBJECTIVE STATEMENT: Pt reports she has done exercises 2 times since PT session last Thursday; pt reports she cleaned her bathroom Thursday evening after PT appt and then vacuumed her steps on Friday morning and then had a significant amount of back pain.  BP was elevated due to the pain on Friday - had a call from RN from [facility] and informed her of elevated BP; pt was instructed to contact her cardiologist.  Pt reports she was in bed most of day on Sunday due to the increased pain.  Pt reports she now realizes she should not have done those housekeeping activities due to stressing and straining her low back - states she will not do them again  until her back has healed.  Pt accompanied by: self  PERTINENT HISTORY: The patient was admitted on 12/07/2023 &  underwent PLIF L4-5. Hospitalized 12-07-23 - 12-11-23:  The patient was admitted on 12/25/2023 and taken to the operating room where the patient underwent irrigation and debridement of lumbar wound with primary closure and placement of external wound VAC- hospitalized 12-25-23 -  12-26-23  Frequent falls:  Spinal cord stimulator insertion on 09-28-19; stimulator removal on 05-27-21 with resultant LLE weakness;  decompression and laminectomy on 12/28/15 at L4-L5  with redo decompression surgery on May 9,2019, post-op paraparesis with surgery to remove small hematoma; 07/12/18 fall at work (involved rolling chair) with hospital admission, transfer to CIR with d/c home 08/03/18; HTN, Bil foot surgery; left ventricular hypertrophy, chronic pain syndrome, dyspnea, hypertrophic cardiomyopathy; cervical fusion March 2023;  had carpal tunnel surgery RUE in April 2024:  LVH  PAIN:  Are you having pain? Pt rates pain 6/10 in lumbar region - greater pain in lower lumbar region near sacral area Alleviating Factor:  placing pillow behind back when sitting Aggravating Factors: certain movements    PRECAUTIONS: Fall; No bending/lifting or twisting; pt states she has a reacher  WEIGHT BEARING RESTRICTIONS: No  FALLS: Has patient fallen in last 6 months? Yes. Number of falls 3  LIVING ENVIRONMENT: Lives with: lives with their spouse Lives in: House/apartment Stairs: Yes: External: 5 steps; on right going up Has following equipment at home: Single point cane, Wheelchair (manual), and [employer]strand crutch  PLOF: Independent with basic ADLs, Independent with household mobility with device, Independent with community mobility with device, and Independent  with transfers  PATIENT GOALS: improve walking and strength Rt leg  OBJECTIVE:   DIAGNOSTIC FINDINGS: IMPRESSION: 1. Interval anterior discectomy and fusion  from C4 through C6 with improved patency of the spinal canal and only mild residual foraminal narrowing. 2. Stable mild spinal stenosis and mild to moderate left foraminal narrowing at C3-4. 3. Stable mild spinal stenosis and mild foraminal narrowing bilaterally at C6-7. Mild foraminal narrowing bilaterally at C7-T1. 4. No cord deformity or abnormal cord signal.  No acute findings. No recent MRI of lumbar region  COGNITION: Overall cognitive status: Within functional limits for tasks assessed   SENSATION: WFL  COORDINATION: Decreased RLE due to weakness and spasticity  POSTURE: No Significant postural limitations  LOWER EXTREMITY ROM:   LLE WFL's;  RLE WFL's except for ankle ROM which is minimal due to weakness; Pt has difficulty performing Rt knee extension due to Rt quad weakness and spasticity - turned body slightly to Lt to achieve more knee extension   LOWER EXTREMITY MMT:    MMT Right Eval Left Eval  Hip flexion 3- 3+   Hip extension    Hip abduction    Hip adduction    Hip internal rotation    Hip external rotation    Knee flexion 3+   Knee extension 4- 4  Ankle dorsiflexion 2-   Ankle plantarflexion    Ankle inversion    Ankle eversion    (Blank rows = not tested)  BED MOBILITY:  Modified independent  TRANSFERS: Assistive device utilized:  Rollator   Sit to stand: Modified independence Stand to sit: Modified independence   STAIRS:  Not tested due to c/o back pain   GAIT: Gait pattern: decreased step length- Right, decreased stance time- Right, decreased hip/knee flexion- Right, and decreased ankle dorsiflexion- Right Distance walked: 50' Assistive device utilized:  with rollator Level of assistance: SBA and CGA Comments: slow gait speed due to c/o back pain and RLE weakness  FUNCTIONAL TESTS:  Sit to stand score - 2 reps - with bil. UE support from chair- 29.91 secs             TUG score: - 49.19 secs with rollator  Gait velocity:  37.38 secs =  .88 ft/sec with rollator  PATIENT SURVEYS:  N/A - based on diagnosis of RLE weakness due to chronic lumbar radiculopathy  TODAY'S TREATMENT: 03-01-24                                                                                                                           TherEx: AROM RLE for strengthening:  Seated in chair with pillow behind back -  LAQ 10 reps RLE - no increased resistance used Pelvic tilt x 5 reps with 5 sec hold Small bridge -  5 reps Rt Hip flexion with knee flexed in hooklying position 10 reps with assist Pelvic tilt with LE extension/flexion assisted 10 reps - flexed to approx. 75 degrees active assisted SAQ 10 reps  RLE no increased resistance used - leg on blue bolster Rt assisted heel slide for assisted hip and knee flexion (small range) 10 reps Rt hip abdct. In hooklying position 10 reps  SciFit level 2.0 with Ue's and LE's at end of session - 5"  Standing closed chain exercises standing at bar at mirror - 2" step used - pt performed tap ups with LLE 10 reps for RLE weight bearing  Step ups with RLE onto 2" step -  LUE support on bar and RUE support on rollator with CGA also  - 10 reps   PATIENT EDUCATION: Education details:  reviewed previous HEPill  Person educated: Patient Education method: Explanation - handout declined as pt stated she has it from previous OP admission Education comprehension: verbalized understanding  HOME EXERCISE PROGRAM:   GOALS: Goals reviewed with patient? Yes  SHORT TERM GOALS: Target date:  03-15-24   Pt will increase gait velocity to >/= 1.2 ft/sec with use of rollator for incr. Gait efficiency. Baseline: 37.38 secs = .88 ft/sec with rollator Goal status: INITIAL  2.  Amb. 115' with RW with SBA without requiring seated rest period. Baseline:  Goal status: INITIAL  3.  Perform 5x sit to stand with bil. UE support with pt demonstrating equal weight bearing on each leg. Baseline: 2x with bil. UE support - due to c/o  back pain Goal status: INITIAL  4.  Improved TUG score to </= 35 secs with use of rollator for improved mobility and reduced fall risk. Baseline: 49.19 secs with rollator Goal status: INITIAL  5.  Negotiate 4 steps with CGA with use of bil. Hand rails Baseline:  Goal status: INITIAL  6.  Independent in HEP for RLE strengthening.  Baseline:  Goal status: INITIAL    LONG TERM GOALS: Target date: 04-15-24  Amb. 230' with RW with SBA without requiring seated rest period. Baseline:  Goal status: INITIAL  2.  Improve TUG score to </= 28 secs  with use of RW for improved mobility and reduced fall risk. Baseline: 49.19 secs with rollator Goal status: INITIAL  3.  Perform 5x sit to stand transfers with LUE support only to demo improved strength in RLE.  Baseline:  Goal status: INITIAL  4.  Modified independent step negotiation with use of bil. Hand rails using step by step sequence. Baseline:  Goal status: INITIAL  5.  Increase gait velocity to >/= 1.6 ft/sec with rollator for increased gait efficiency with reduced fall risk.          Baseline: 37.38 secs = .88 ft/sec with rollator          Goal status:  INITIAL   6.  Independent in updated HEP for RLE strengthening. Baseline:  Goal status: INITIAL   7.  Report reduced low back pain at rest to </= 3/10 intensity.            Baseline:  7/10            Goal status:  INITIAL  ASSESSMENT:  CLINICAL IMPRESSION: PT session focused on RLE strengthening exercises for Rt quads and low back strengthening.  Pt limited by c/o pain in low back.  Reported feeling some strain with LAQ's RLE performed in seated position on reps 8-10 (no resistance used).  Pt continues to use rollator for assistance with amb. And blue rocker AFO on RLE.  Unable to tolerate increased resistance (other than gravity) with PRE's due to c/o low back pain.  Cont with POC.  OBJECTIVE IMPAIRMENTS: Abnormal gait, decreased balance, decreased coordination, decreased  endurance, decreased strength, and impaired tone.   ACTIVITY LIMITATIONS: carrying, bending, squatting, stairs, transfers, and locomotion level  PARTICIPATION LIMITATIONS: cleaning, laundry, shopping, and community activity  PERSONAL FACTORS: Behavior pattern, Past/current experiences, and Time since onset of injury/illness/exacerbation are also affecting patient's functional outcome.   REHAB POTENTIAL: Good  CLINICAL DECISION MAKING: Evolving/moderate complexity  EVALUATION COMPLEXITY: Moderate  PLAN:  PT FREQUENCY: 2x/week  PT DURATION: 8 weeks + eval   PLANNED INTERVENTIONS: Therapeutic exercises, Therapeutic activity, Neuromuscular re-education, Balance training, Gait training, Patient/Family education, Self Care, Stair training, Orthotic/Fit training, DME instructions, and Aquatic Therapy  PLAN FOR NEXT SESSION: cont RLE strengthening; try 1-2# weight with PRE's   Markesia Crilly, Celeste Cola, PT 03/02/2024, 10:49 AM

## 2024-03-02 ENCOUNTER — Encounter: Payer: Self-pay | Admitting: Physical Therapy

## 2024-03-03 ENCOUNTER — Other Ambulatory Visit (HOSPITAL_BASED_OUTPATIENT_CLINIC_OR_DEPARTMENT_OTHER): Payer: Self-pay

## 2024-03-03 ENCOUNTER — Other Ambulatory Visit (HOSPITAL_COMMUNITY): Payer: Self-pay

## 2024-03-03 DIAGNOSIS — M65312 Trigger thumb, left thumb: Secondary | ICD-10-CM | POA: Diagnosis not present

## 2024-03-03 DIAGNOSIS — G5603 Carpal tunnel syndrome, bilateral upper limbs: Secondary | ICD-10-CM | POA: Diagnosis not present

## 2024-03-04 ENCOUNTER — Other Ambulatory Visit (HOSPITAL_COMMUNITY): Payer: Self-pay

## 2024-03-05 ENCOUNTER — Other Ambulatory Visit (HOSPITAL_COMMUNITY): Payer: Self-pay

## 2024-03-07 ENCOUNTER — Ambulatory Visit: Admitting: Physical Therapy

## 2024-03-07 ENCOUNTER — Encounter: Payer: Self-pay | Admitting: Physical Therapy

## 2024-03-07 DIAGNOSIS — M6281 Muscle weakness (generalized): Secondary | ICD-10-CM | POA: Diagnosis not present

## 2024-03-07 DIAGNOSIS — R2689 Other abnormalities of gait and mobility: Secondary | ICD-10-CM

## 2024-03-07 NOTE — Therapy (Signed)
 OUTPATIENT PHYSICAL THERAPY NEURO TREATMENT NOTE   Patient Name: [name] MRN: [id number] DOB:Mar 20, 1961, 63 y.o., female Today's Date: 03/07/2024   PCP: [place], [name]., MD REFERRING PROVIDER: Rawland Caddy, MD  END OF SESSION:  PT End of Session - 03/07/24 2008     Visit Number 4    Number of Visits 17    Date for PT Re-Evaluation 04/15/24    Authorization Type [employer]    Authorization Time Period 02-16-24 -04-25-24    Progress Note Due on Visit 10    PT Start Time 0933    PT Stop Time 1017    PT Time Calculation (min) 44 min    Equipment Utilized During Treatment Gait belt    Activity Tolerance Other (comment)   limited by RLE weakness   Behavior During Therapy [facility] for tasks assessed/performed                 Past Medical History:  Diagnosis Date   Anxiety    Aortic atherosclerosis (HCC) 04/16/2021   Atypical angina (HCC)    Back pain    related to spinal stenosis and disc problem, radiates down left buttocks to leg., weakness occ.   Chest pain    a. 03/2015 Cath: nl cors; b. 03/2021 Cor CTA: Ca2+ = 0. Nl Cors.   Chronic pain syndrome    Dyspnea    hx   GERD (gastroesophageal reflux disease)    Grade I diastolic dysfunction    Headache    Hyperlipidemia    Hypertension    Lumbar post-laminectomy syndrome    LVH (left ventricular hypertrophy) 12/15/2020   a. 11/2020 Echo: EF 65-70%, no rwma, sev asymm LVH with IVSd 1.9 cm. No LVOT obs @ rest. Gr1 DD. Triv MR.   PONV (postoperative nausea and vomiting)    Pulmonary nodules    a. 03/2021 CT Chest: 3mm pulm nodules in bilat lower lobes. F/u 1 yr.   Right foot drop    Syncope    a. 11/2020 Zio: No significant arrhythmias.   Vaginal foreign object    "Uses Femring "   Past Surgical History:  Procedure Laterality Date   ABDOMINAL HYSTERECTOMY     ANTERIOR CERVICAL DECOMP/DISCECTOMY FUSION N/A 01/03/2022   Procedure: Anterior Cervical Decompression Fusion - Cervical four-Cervical  five - Cervical five-Cervical six;  Surgeon: Isadora Mar, MD;  Location: [facility] OR;  Service: Neurosurgery;  Laterality: N/A;   BIOPSY  12/16/2020   Procedure: BIOPSY;  Surgeon: Asencion Blacksmith, MD;  Location: [facility] ENDOSCOPY;  Service: Endoscopy;;   CARDIAC CATHETERIZATION N/A 04/18/2015   Procedure: Left Heart Cath and Coronary Angiography;  Surgeon: Chapman Commodore, MD;  Location: [facility] INVASIVE CV LAB;  Service: Cardiovascular;  Laterality: N/A;   COLONOSCOPY     COLONOSCOPY W/ BIOPSIES AND POLYPECTOMY  2018   ESOPHAGOGASTRODUODENOSCOPY (EGD) WITH PROPOFOL  N/A 12/16/2020   Procedure: ESOPHAGOGASTRODUODENOSCOPY (EGD) WITH PROPOFOL ;  Surgeon: Asencion Blacksmith, MD;  Location: [facility] ENDOSCOPY;  Service: Endoscopy;  Laterality: N/A;   FOOT SURGERY Bilateral    [facility] "bunion,bone spur, tendon" (1) -6'16, (1)-10'16   HEMATOMA EVACUATION N/A 01/05/2022   Procedure: Cervical Wound Exploration;  Surgeon: Audie Bleacher, MD;  Location: [facility] OR;  Service: Neurosurgery;  Laterality: N/A;   IR EPIDUROGRAPHY  07/21/2018   LUMBAR LAMINECTOMY/DECOMPRESSION MICRODISCECTOMY Bilateral 12/28/2015   Procedure: MICRO LUMBAR DECOMPRESSION L4 - L5 BILATERALLY;  Surgeon: Orvan Blanch, MD;  Location: [facility] ORS;  Service: Orthopedics;  Laterality: Bilateral;  LUMBAR LAMINECTOMY/DECOMPRESSION MICRODISCECTOMY Bilateral 03/04/2018   Procedure: Revision of Microlumbar Decompression Bilateral Lumbar Four-Five;  Surgeon: Orvan Blanch, MD;  Location: [facility] OR;  Service: Orthopedics;  Laterality: Bilateral;  90 mins   LUMBAR WOUND DEBRIDEMENT N/A 12/25/2023   Procedure: Irrigation and debridement of lumbar wound;  Surgeon: Joaquin Mulberry, MD;  Location: [facility] OR;  Service: Neurosurgery;  Laterality: N/A;   POSTERIOR FUSION PEDICLE SCREW PLACEMENT N/A 12/07/2023   Procedure: LUMBAR FOUR-FIVE POSTERIOR LATERAL FUSION;  Surgeon: Joaquin Mulberry, MD;  Location: [facility] OR;  Service: Neurosurgery;  Laterality: N/A;   SAVORY DILATION N/A  12/16/2020   Procedure: SAVORY DILATION;  Surgeon: Asencion Blacksmith, MD;  Location: [facility] ENDOSCOPY;  Service: Endoscopy;  Laterality: N/A;   SPINAL CORD STIMULATOR INSERTION N/A 09/28/2019   Procedure: THORACIC SPINAL CORD STIMULATOR INSERTION;  Surgeon: Mort Ards, MD;  Location: [facility] OR;  Service: Orthopedics;  Laterality: N/A;  2.5 hrs   SPINAL CORD STIMULATOR REMOVAL N/A 05/27/2021   Procedure: LUMBAR SPINAL CORD STIMULATOR REMOVAL;  Surgeon: Berta Brittle, MD;  Location: [facility] ORS;  Service: Neurosurgery;  Laterality: N/A;   TUBAL LIGATION     WISDOM TOOTH EXTRACTION     WOUND EXPLORATION N/A 03/04/2018   Procedure: EXPLORATION OF LUMBAR DECOMPRESSION WOUND;  Surgeon: Orvan Blanch, MD;  Location: [facility] OR;  Service: Orthopedics;  Laterality: N/A;   Patient Active Problem List   Diagnosis Date Noted   Seasonal allergic rhinitis due to pollen 01/29/2024   Delayed wound healing 12/25/2023   S/P lumbar fusion 12/07/2023   Pain in thumb joint with movement of right hand 08/12/2023   Localized primary osteoarthritis of carpometacarpal joint of right thumb 07/29/2023   Right foot drop 07/29/2023   Frequent falls 04/14/2023   Gastroesophageal reflux disease 04/14/2023   Right upper extremity numbness 09/29/2022   Stage 3a chronic kidney disease (HCC) 09/29/2022   Cervical spondylosis with radiculopathy 01/05/2022   S/P cervical spinal fusion 01/03/2022   Cervical disc disorder with radiculopathy of cervical region 11/27/2021   Uncontrolled pain 05/27/2021   Syncope 05/13/2021   Depression with anxiety 05/13/2021   Chronic diastolic CHF (congestive heart failure) (HCC) 05/13/2021   Chronic back pain 05/13/2021   Chest tightness 05/13/2021   Lower abdominal pain 05/13/2021   Syncope and collapse 01/01/2021   Abnormal echocardiogram 01/01/2021   BPPV (benign paroxysmal positional vertigo), left 12/26/2020   Odynophagia    Dysphagia 12/15/2020   Postural dizziness with presyncope  12/14/2020   Lumbar post-laminectomy syndrome    Chronic pain 09/28/2019   Lumbar spinal stenosis    Radicular pain    Gait disorder    Difficulty in urination 07/13/2018   Back pain 07/12/2018   Hyperlipidemia 04/16/2018   GAD (generalized anxiety disorder) 04/16/2018   AKI (acute kidney injury) (HCC)    Benign essential HTN    Major depression, recurrent (HCC)    Lumbar radiculopathy 03/09/2018   Paraparesis (HCC) 03/09/2018   Essential hypertension    Chronic nonintractable headache    Leukocytosis    Acute blood loss anemia    Postoperative pain    Generalized weakness    Spinal stenosis at L4-L5 level 03/04/2018   Spinal stenosis of lumbar region 12/28/2015   Chest pain 04/16/2015    ONSET DATE: Most recent Surgery date 12-07-23:  Referral date 02-03-24  REFERRING DIAG:  M54.16 (ICD-10-CM) - Lumbar radiculopathy M96.1 (ICD-10-CM) - Lumbar post-laminectomy syndrome R26.9 (ICD-10-CM) - Gait disorder  THERAPY DIAG:  Muscle weakness (generalized)  Other abnormalities of gait and mobility  Rationale for Evaluation and Treatment: Rehabilitation  SUBJECTIVE:                                                                                                                                                                                             SUBJECTIVE STATEMENT: Pt reports she went to church yesterday but has trouble tolerating sitting for prolonged time in the pews because it causes increased back pain - states she refuses to take a lumbar pillow to church Pt reports her Rt knee buckled with her 2 different times on Saturday - says her son helped her the first time when it happened and then the 2nd time she grabbed the bed post to keep from falling - had the blue rocker AFO on Rt leg both times which did provide some Rt knee stability and prevented her from falling Pt accompanied by: self  PERTINENT HISTORY: The patient was admitted on 12/07/2023 &  underwent PLIF L4-5.  Hospitalized 12-07-23 - 12-11-23:  The patient was admitted on 12/25/2023 and taken to the operating room where the patient underwent irrigation and debridement of lumbar wound with primary closure and placement of external wound VAC- hospitalized 12-25-23 -  12-26-23  Frequent falls:  Spinal cord stimulator insertion on 09-28-19; stimulator removal on 05-27-21 with resultant LLE weakness;  decompression and laminectomy on 12/28/15 at L4-L5  with redo decompression surgery on May 9,2019, post-op paraparesis with surgery to remove small hematoma; 07/12/18 fall at work (involved rolling chair) with hospital admission, transfer to CIR with d/c home 08/03/18; HTN, Bil foot surgery; left ventricular hypertrophy, chronic pain syndrome, dyspnea, hypertrophic cardiomyopathy; cervical fusion March 2023;  had carpal tunnel surgery RUE in April 2024:  LVH  PAIN:  Are you having pain? Pt rates pain 5/10 in lumbar region - greater pain in lower lumbar region near sacral area Alleviating Factor:  placing pillow behind back when sitting Aggravating Factors: certain movements    PRECAUTIONS: Fall; No bending/lifting or twisting; pt states she has a reacher  WEIGHT BEARING RESTRICTIONS: No  FALLS: Has patient fallen in last 6 months? Yes. Number of falls 3  LIVING ENVIRONMENT: Lives with: lives with their spouse Lives in: House/apartment Stairs: Yes: External: 5 steps; on right going up Has following equipment at home: Single point cane, Wheelchair (manual), and L[place]strand crutch  PLOF: Independent with basic ADLs, Independent with household mobility with device, Independent with community mobility with device, and Independent with transfers  PATIENT GOALS: improve walking and strength Rt leg  OBJECTIVE:   DIAGNOSTIC FINDINGS: IMPRESSION: 1. Interval anterior discectomy and fusion from C4  through C6 with improved patency of the spinal canal and only mild residual foraminal narrowing. 2. Stable mild spinal  stenosis and mild to moderate left foraminal narrowing at C3-4. 3. Stable mild spinal stenosis and mild foraminal narrowing bilaterally at C6-7. Mild foraminal narrowing bilaterally at C7-T1. 4. No cord deformity or abnormal cord signal.  No acute findings. No recent MRI of lumbar region  COGNITION: Overall cognitive status: Within functional limits for tasks assessed   SENSATION: WFL  COORDINATION: Decreased RLE due to weakness and spasticity  POSTURE: No Significant postural limitations  LOWER EXTREMITY ROM:   LLE WFL's;  RLE WFL's except for ankle ROM which is minimal due to weakness; Pt has difficulty performing Rt knee extension due to Rt quad weakness and spasticity - turned body slightly to Lt to achieve more knee extension   LOWER EXTREMITY MMT:    MMT Right Eval Left Eval  Hip flexion 3- 3+   Hip extension    Hip abduction    Hip adduction    Hip internal rotation    Hip external rotation    Knee flexion 3+   Knee extension 4- 4  Ankle dorsiflexion 2-   Ankle plantarflexion    Ankle inversion    Ankle eversion    (Blank rows = not tested)  BED MOBILITY:  Modified independent  TRANSFERS: Assistive device utilized: Rollator  Sit to stand: Modified independence Stand to sit: Modified independence   STAIRS:  Not tested due to c/o back pain   GAIT: Gait pattern: decreased step length- Right, decreased stance time- Right, decreased hip/knee flexion- Right, and decreased ankle dorsiflexion- Right Distance walked: 50' Assistive device utilized: with rollator Level of assistance: SBA and CGA Comments: slow gait speed due to c/o back pain and RLE weakness  FUNCTIONAL TESTS:  Sit to stand score - 2 reps - with bil. UE support from chair- 29.91 secs             TUG score: - 49.19 secs with rollator  Gait velocity:  37.38 secs = .88 ft/sec with rollator  PATIENT SURVEYS:  N/A - based on diagnosis of RLE weakness due to chronic lumbar  radiculopathy  TODAY'S TREATMENT: 03-07-24                                                                                                                           TherEx: AROM RLE for strengthening: RLE LAQ - seated in chair with pillow behind back - 10 reps - no resistance used - performed at start of session  2# weight used for RLE LAQ - 5 reps due to c/o fatigue and muscle weakness   SciFit level 2.0 with Ue's and LE's at end of session - 5"   TherAct: Tap ups LLE to 2" step 10 reps for RLE weight bearing & closed chain isometric strengthening; pt performed 2 sets - 1st set with bil. UE support on // bars: 2nd set - with  RUE  support only on // bar- 10 reps Step ups RLE to 2" step; 2nd set 10 reps with bil. UE support 4" step used inside // bars  - tap ups LLE 10 reps; 3 reps step ups RLE to 4" step  Stepping forward/back, laterally 5 reps with LLE for increased RLE weight bearing:  1/2 marching LLE 10 reps   Gait; pt using rollator and wearing Blue rocker AFO on RLE - pt modified independent with use of rollator Gait trained 2 laps inside // bars - forwards/backwards with bil. UE support on // bars   PATIENT EDUCATION: Education details:  reviewed previous HEPill  Person educated: Patient Education method: Explanation - handout declined as pt stated she has it from previous OP admission Education comprehension: verbalized understanding  HOME EXERCISE PROGRAM:   GOALS: Goals reviewed with patient? Yes  SHORT TERM GOALS: Target date:  03-15-24   Pt will increase gait velocity to >/= 1.2 ft/sec with use of rollator for incr. Gait efficiency. Baseline: 37.38 secs = .88 ft/sec with rollator Goal status: INITIAL  2.  Amb. 115' with RW with SBA without requiring seated rest period. Baseline:  Goal status: INITIAL  3.  Perform 5x sit to stand with bil. UE support with pt demonstrating equal weight bearing on each leg. Baseline: 2x with bil. UE support - due to c/o back  pain Goal status: INITIAL  4.  Improved TUG score to </= 35 secs with use of rollator for improved mobility and reduced fall risk. Baseline: 49.19 secs with rollator Goal status: INITIAL  5.  Negotiate 4 steps with CGA with use of bil. Hand rails Baseline:  Goal status: INITIAL  6.  Independent in HEP for RLE strengthening.  Baseline:  Goal status: INITIAL    LONG TERM GOALS: Target date: 04-15-24  Amb. 230' with RW with SBA without requiring seated rest period. Baseline:  Goal status: INITIAL  2.  Improve TUG score to </= 28 secs  with use of RW for improved mobility and reduced fall risk. Baseline: 49.19 secs with rollator Goal status: INITIAL  3.  Perform 5x sit to stand transfers with LUE support only to demo improved strength in RLE.  Baseline:  Goal status: INITIAL  4.  Modified independent step negotiation with use of bil. Hand rails using step by step sequence. Baseline:  Goal status: INITIAL  5.  Increase gait velocity to >/= 1.6 ft/sec with rollator for increased gait efficiency with reduced fall risk.          Baseline: 37.38 secs = .88 ft/sec with rollator          Goal status:  INITIAL   6.  Independent in updated HEP for RLE strengthening. Baseline:  Goal status: INITIAL   7.  Report reduced low back pain at rest to </= 3/10 intensity.            Baseline:  7/10            Goal status:  INITIAL  ASSESSMENT:  CLINICAL IMPRESSION: PT session focused on RLE strengthening in closed chain position.  Pt has decreased muscle endurance of Rt quads with fatigue reported after performing 3 reps of step up exercise onto 4" step with RLE.  Pt able to perform 5 reps only RLE LAQ with use of 2# weight.  Pt reports fatigue in RLE with increased weight bearing and feeling that Rt knee will buckle, indicative of decreased muscle strength and decreased endurance.  Cont with POC.  OBJECTIVE IMPAIRMENTS: Abnormal gait, decreased balance, decreased coordination,  decreased endurance, decreased strength, and impaired tone.   ACTIVITY LIMITATIONS: carrying, bending, squatting, stairs, transfers, and locomotion level  PARTICIPATION LIMITATIONS: cleaning, laundry, shopping, and community activity  PERSONAL FACTORS: Behavior pattern, Past/current experiences, and Time since onset of injury/illness/exacerbation are also affecting patient's functional outcome.   REHAB POTENTIAL: Good  CLINICAL DECISION MAKING: Evolving/moderate complexity  EVALUATION COMPLEXITY: Moderate  PLAN:  PT FREQUENCY: 2x/week  PT DURATION: 8 weeks + eval   PLANNED INTERVENTIONS: Therapeutic exercises, Therapeutic activity, Neuromuscular re-education, Balance training, Gait training, Patient/Family education, Self Care, Stair training, Orthotic/Fit training, DME instructions, and Aquatic Therapy  PLAN FOR NEXT SESSION: cont RLE strengthening; try 1-2# weight with PRE's   Kesia Dalto, Celeste Cola, PT 03/07/2024, 8:13 PM

## 2024-03-10 ENCOUNTER — Ambulatory Visit: Admitting: Physical Therapy

## 2024-03-10 DIAGNOSIS — R2689 Other abnormalities of gait and mobility: Secondary | ICD-10-CM

## 2024-03-10 DIAGNOSIS — M6281 Muscle weakness (generalized): Secondary | ICD-10-CM | POA: Diagnosis not present

## 2024-03-10 NOTE — Therapy (Unsigned)
 OUTPATIENT PHYSICAL THERAPY NEURO TREATMENT NOTE   Patient Name: [name] MRN: [id number] DOB:1961/07/25, 63 y.o., female Today's Date: 03/11/2024   PCP: [place], [name]., MD REFERRING PROVIDER: Rawland Caddy, MD  END OF SESSION:  PT End of Session - 03/11/24 1524     Visit Number 5    Number of Visits 17    Date for PT Re-Evaluation 04/15/24    Authorization Type [employer]    Authorization Time Period 02-16-24 -04-25-24    Progress Note Due on Visit 10    PT Start Time 0845    PT Stop Time 0932    PT Time Calculation (min) 47 min    Equipment Utilized During Treatment Gait belt    Activity Tolerance Other (comment);Patient tolerated treatment well   limited by RLE weakness   Behavior During Therapy [facility] for tasks assessed/performed                  Past Medical History:  Diagnosis Date   Anxiety    Aortic atherosclerosis (HCC) 04/16/2021   Atypical angina (HCC)    Back pain    related to spinal stenosis and disc problem, radiates down left buttocks to leg., weakness occ.   Chest pain    a. 03/2015 Cath: nl cors; b. 03/2021 Cor CTA: Ca2+ = 0. Nl Cors.   Chronic pain syndrome    Dyspnea    hx   GERD (gastroesophageal reflux disease)    Grade I diastolic dysfunction    Headache    Hyperlipidemia    Hypertension    Lumbar post-laminectomy syndrome    LVH (left ventricular hypertrophy) 12/15/2020   a. 11/2020 Echo: EF 65-70%, no rwma, sev asymm LVH with IVSd 1.9 cm. No LVOT obs @ rest. Gr1 DD. Triv MR.   PONV (postoperative nausea and vomiting)    Pulmonary nodules    a. 03/2021 CT Chest: 3mm pulm nodules in bilat lower lobes. F/u 1 yr.   Right foot drop    Syncope    a. 11/2020 Zio: No significant arrhythmias.   Vaginal foreign object    "Uses Femring "   Past Surgical History:  Procedure Laterality Date   ABDOMINAL HYSTERECTOMY     ANTERIOR CERVICAL DECOMP/DISCECTOMY FUSION N/A 01/03/2022   Procedure: Anterior Cervical Decompression  Fusion - Cervical four-Cervical five - Cervical five-Cervical six;  Surgeon: Isadora Mar, MD;  Location: [facility] OR;  Service: Neurosurgery;  Laterality: N/A;   BIOPSY  12/16/2020   Procedure: BIOPSY;  Surgeon: Asencion Blacksmith, MD;  Location: [facility] ENDOSCOPY;  Service: Endoscopy;;   CARDIAC CATHETERIZATION N/A 04/18/2015   Procedure: Left Heart Cath and Coronary Angiography;  Surgeon: Chapman Commodore, MD;  Location: [facility] INVASIVE CV LAB;  Service: Cardiovascular;  Laterality: N/A;   COLONOSCOPY     COLONOSCOPY W/ BIOPSIES AND POLYPECTOMY  2018   ESOPHAGOGASTRODUODENOSCOPY (EGD) WITH PROPOFOL  N/A 12/16/2020   Procedure: ESOPHAGOGASTRODUODENOSCOPY (EGD) WITH PROPOFOL ;  Surgeon: Asencion Blacksmith, MD;  Location: [facility] ENDOSCOPY;  Service: Endoscopy;  Laterality: N/A;   FOOT SURGERY Bilateral    [facility] "bunion,bone spur, tendon" (1) -6'16, (1)-10'16   HEMATOMA EVACUATION N/A 01/05/2022   Procedure: Cervical Wound Exploration;  Surgeon: Audie Bleacher, MD;  Location: [facility] OR;  Service: Neurosurgery;  Laterality: N/A;   IR EPIDUROGRAPHY  07/21/2018   LUMBAR LAMINECTOMY/DECOMPRESSION MICRODISCECTOMY Bilateral 12/28/2015   Procedure: MICRO LUMBAR DECOMPRESSION L4 - L5 BILATERALLY;  Surgeon: Orvan Blanch, MD;  Location: [facility] ORS;  Service: Orthopedics;  Laterality: Bilateral;   LUMBAR LAMINECTOMY/DECOMPRESSION MICRODISCECTOMY Bilateral 03/04/2018   Procedure: Revision of Microlumbar Decompression Bilateral Lumbar Four-Five;  Surgeon: Orvan Blanch, MD;  Location: [facility] OR;  Service: Orthopedics;  Laterality: Bilateral;  90 mins   LUMBAR WOUND DEBRIDEMENT N/A 12/25/2023   Procedure: Irrigation and debridement of lumbar wound;  Surgeon: Joaquin Mulberry, MD;  Location: [facility] OR;  Service: Neurosurgery;  Laterality: N/A;   POSTERIOR FUSION PEDICLE SCREW PLACEMENT N/A 12/07/2023   Procedure: LUMBAR FOUR-FIVE POSTERIOR LATERAL FUSION;  Surgeon: Joaquin Mulberry, MD;  Location: [facility] OR;  Service: Neurosurgery;   Laterality: N/A;   SAVORY DILATION N/A 12/16/2020   Procedure: SAVORY DILATION;  Surgeon: Asencion Blacksmith, MD;  Location: [facility] ENDOSCOPY;  Service: Endoscopy;  Laterality: N/A;   SPINAL CORD STIMULATOR INSERTION N/A 09/28/2019   Procedure: THORACIC SPINAL CORD STIMULATOR INSERTION;  Surgeon: Mort Ards, MD;  Location: [facility] OR;  Service: Orthopedics;  Laterality: N/A;  2.5 hrs   SPINAL CORD STIMULATOR REMOVAL N/A 05/27/2021   Procedure: LUMBAR SPINAL CORD STIMULATOR REMOVAL;  Surgeon: Berta Brittle, MD;  Location: [facility] ORS;  Service: Neurosurgery;  Laterality: N/A;   TUBAL LIGATION     WISDOM TOOTH EXTRACTION     WOUND EXPLORATION N/A 03/04/2018   Procedure: EXPLORATION OF LUMBAR DECOMPRESSION WOUND;  Surgeon: Orvan Blanch, MD;  Location: [facility] OR;  Service: Orthopedics;  Laterality: N/A;   Patient Active Problem List   Diagnosis Date Noted   Seasonal allergic rhinitis due to pollen 01/29/2024   Delayed wound healing 12/25/2023   S/P lumbar fusion 12/07/2023   Pain in thumb joint with movement of right hand 08/12/2023   Localized primary osteoarthritis of carpometacarpal joint of right thumb 07/29/2023   Right foot drop 07/29/2023   Frequent falls 04/14/2023   Gastroesophageal reflux disease 04/14/2023   Right upper extremity numbness 09/29/2022   Stage 3a chronic kidney disease (HCC) 09/29/2022   Cervical spondylosis with radiculopathy 01/05/2022   S/P cervical spinal fusion 01/03/2022   Cervical disc disorder with radiculopathy of cervical region 11/27/2021   Uncontrolled pain 05/27/2021   Syncope 05/13/2021   Depression with anxiety 05/13/2021   Chronic diastolic CHF (congestive heart failure) (HCC) 05/13/2021   Chronic back pain 05/13/2021   Chest tightness 05/13/2021   Lower abdominal pain 05/13/2021   Syncope and collapse 01/01/2021   Abnormal echocardiogram 01/01/2021   BPPV (benign paroxysmal positional vertigo), left 12/26/2020   Odynophagia    Dysphagia 12/15/2020    Postural dizziness with presyncope 12/14/2020   Lumbar post-laminectomy syndrome    Chronic pain 09/28/2019   Lumbar spinal stenosis    Radicular pain    Gait disorder    Difficulty in urination 07/13/2018   Back pain 07/12/2018   Hyperlipidemia 04/16/2018   GAD (generalized anxiety disorder) 04/16/2018   AKI (acute kidney injury) (HCC)    Benign essential HTN    Major depression, recurrent (HCC)    Lumbar radiculopathy 03/09/2018   Paraparesis (HCC) 03/09/2018   Essential hypertension    Chronic nonintractable headache    Leukocytosis    Acute blood loss anemia    Postoperative pain    Generalized weakness    Spinal stenosis at L4-L5 level 03/04/2018   Spinal stenosis of lumbar region 12/28/2015   Chest pain 04/16/2015    ONSET DATE: Most recent Surgery date 12-07-23:  Referral date 02-03-24  REFERRING DIAG:  M54.16 (ICD-10-CM) - Lumbar radiculopathy M96.1 (ICD-10-CM) - Lumbar post-laminectomy syndrome R26.9 (ICD-10-CM) - Gait disorder  THERAPY DIAG:  Muscle weakness (generalized)  Other abnormalities of gait and mobility  Rationale for Evaluation and Treatment: Rehabilitation  SUBJECTIVE:                                                                                                                                                                                             SUBJECTIVE STATEMENT: Pt reports she is doing OK - took a pain pill last night so she could sleep well; is not having a lot of pain this morning and thinks it is because she took the medication last night Pt accompanied by: self  PERTINENT HISTORY: The patient was admitted on 12/07/2023 &  underwent PLIF L4-5. Hospitalized 12-07-23 - 12-11-23:  The patient was admitted on 12/25/2023 and taken to the operating room where the patient underwent irrigation and debridement of lumbar wound with primary closure and placement of external wound VAC- hospitalized 12-25-23 -  12-26-23  Frequent falls:  Spinal cord  stimulator insertion on 09-28-19; stimulator removal on 05-27-21 with resultant LLE weakness;  decompression and laminectomy on 12/28/15 at L4-L5  with redo decompression surgery on May 9,2019, post-op paraparesis with surgery to remove small hematoma; 07/12/18 fall at work (involved rolling chair) with hospital admission, transfer to CIR with d/c home 08/03/18; HTN, Bil foot surgery; left ventricular hypertrophy, chronic pain syndrome, dyspnea, hypertrophic cardiomyopathy; cervical fusion March 2023;  had carpal tunnel surgery RUE in April 2024:  LVH  PAIN:  Are you having pain? Pt rates pain 4-5/10 in lumbar region - greater pain in lower lumbar region near sacral area Alleviating Factor:  placing pillow behind back when sitting Aggravating Factors: certain movements    PRECAUTIONS: Fall; No bending/lifting or twisting; pt states she has a reacher  WEIGHT BEARING RESTRICTIONS: No  FALLS: Has patient fallen in last 6 months? Yes. Number of falls 3  LIVING ENVIRONMENT: Lives with: lives with their spouse Lives in: House/apartment Stairs: Yes: External: 5 steps; on right going up Has following equipment at home: Single point cane, Wheelchair (manual), and Lofstrand crutch  PLOF: Independent with basic ADLs, Independent with household mobility with device, Independent with community mobility with device, and Independent with transfers  PATIENT GOALS: improve walking and strength Rt leg  OBJECTIVE:   DIAGNOSTIC FINDINGS: IMPRESSION: 1. Interval anterior discectomy and fusion from C4 through C6 with improved patency of the spinal canal and only mild residual foraminal narrowing. 2. Stable mild spinal stenosis and mild to moderate left foraminal narrowing at C3-4. 3. Stable mild spinal stenosis and mild foraminal narrowing bilaterally at C6-7. Mild foraminal narrowing bilaterally at C7-T1. 4. No cord deformity or abnormal cord signal.  No acute findings. No recent MRI of lumbar  region  COGNITION: Overall cognitive status: Within functional limits for tasks assessed   SENSATION: WFL  COORDINATION: Decreased RLE due to weakness and spasticity  POSTURE: No Significant postural limitations  LOWER EXTREMITY ROM:   LLE WFL's;  RLE WFL's except for ankle ROM which is minimal due to weakness; Pt has difficulty performing Rt knee extension due to Rt quad weakness and spasticity - turned body slightly to Lt to achieve more knee extension   LOWER EXTREMITY MMT:    MMT Right Eval Left Eval  Hip flexion 3- 3+   Hip extension    Hip abduction    Hip adduction    Hip internal rotation    Hip external rotation    Knee flexion 3+   Knee extension 4- 4  Ankle dorsiflexion 2-   Ankle plantarflexion    Ankle inversion    Ankle eversion    (Blank rows = not tested)  BED MOBILITY:  Modified independent  TRANSFERS: Assistive device utilized: Rollator  Sit to stand: Modified independence Stand to sit: Modified independence   STAIRS:  Not tested due to c/o back pain   GAIT: Gait pattern: decreased step length- Right, decreased stance time- Right, decreased hip/knee flexion- Right, and decreased ankle dorsiflexion- Right Distance walked: 50' Assistive device utilized: with rollator Level of assistance: SBA and CGA Comments: slow gait speed due to c/o back pain and RLE weakness  FUNCTIONAL TESTS:  Sit to stand score - 2 reps - with bil. UE support from chair- 29.91 secs             TUG score: - 49.19 secs with rollator  Gait velocity:  37.38 secs = .88 ft/sec with rollator  PATIENT SURVEYS:  N/A - based on diagnosis of RLE weakness due to chronic lumbar radiculopathy  TODAY'S TREATMENT: 03-10-24                                                                                                                           TherEx: RLE LAQ -  2# weight 10 reps - seated in chair RLE LAQ's with red theraband 6 reps - 3-5 sec hold  Scifit level 2.5 - bil. UE and  LE's x 5"    TherAct: Step ups RLE - onto 4" step 10 reps inside // bars  Tap ups with LLE onto 4" step 10 reps for RLE weight bearing - cues to keep Rt knee slightly flexed Tap ups with RLE to 4" step - 5 reps x 1 set;  2nd set 5 reps - for Rt hip and hamstring strengthening  Step ups RLE onto 4" step 10 reps  2" step 10 reps LLE - RLE on floor with knee flexed bil. UE support;  10 reps with LUE support only  Step down from 2" step for Rt quad eccentric strengthening 10 reps  5 squats - small range - performed inside // bars with bil.  UE support on bars  Stepping forward/back, laterally 5 reps with LLE for increased RLE weight bearing:  1/2 marching LLE 10 reps  Rt knee control exercise - in bilateral stance - 10 reps Rt knee flexion/extension; LLE in forward stance 10 reps RLE knee flexion/extension (no increased resistance)    Gait; pt using rollator and wearing Blue rocker AFO on RLE - pt modified independent with use of rollator         PATIENT EDUCATION: Education details:  reviewed previous HEPill  Person educated: Patient Education method: Explanation - handout declined as pt stated she has it from previous OP admission Education comprehension: verbalized understanding  HOME EXERCISE PROGRAM:   GOALS: Goals reviewed with patient? Yes  SHORT TERM GOALS: Target date:  03-15-24   Pt will increase gait velocity to >/= 1.2 ft/sec with use of rollator for incr. Gait efficiency. Baseline: 37.38 secs = .88 ft/sec with rollator Goal status: INITIAL  2.  Amb. 115' with RW with SBA without requiring seated rest period. Baseline:  Goal status: INITIAL  3.  Perform 5x sit to stand with bil. UE support with pt demonstrating equal weight bearing on each leg. Baseline: 2x with bil. UE support - due to c/o back pain Goal status: INITIAL  4.  Improved TUG score to </= 35 secs with use of rollator for improved mobility and reduced fall risk. Baseline: 49.19 secs with  rollator Goal status: INITIAL  5.  Negotiate 4 steps with CGA with use of bil. Hand rails Baseline:  Goal status: INITIAL  6.  Independent in HEP for RLE strengthening.  Baseline:  Goal status: INITIAL    LONG TERM GOALS: Target date: 04-15-24  Amb. 230' with RW with SBA without requiring seated rest period. Baseline:  Goal status: INITIAL  2.  Improve TUG score to </= 28 secs  with use of RW for improved mobility and reduced fall risk. Baseline: 49.19 secs with rollator Goal status: INITIAL  3.  Perform 5x sit to stand transfers with LUE support only to demo improved strength in RLE.  Baseline:  Goal status: INITIAL  4.  Modified independent step negotiation with use of bil. Hand rails using step by step sequence. Baseline:  Goal status: INITIAL  5.  Increase gait velocity to >/= 1.6 ft/sec with rollator for increased gait efficiency with reduced fall risk.          Baseline: 37.38 secs = .88 ft/sec with rollator          Goal status:  INITIAL   6.  Independent in updated HEP for RLE strengthening. Baseline:  Goal status: INITIAL   7.  Report reduced low back pain at rest to </= 3/10 intensity.            Baseline:  7/10            Goal status:  INITIAL  ASSESSMENT:  CLINICAL IMPRESSION: PT session focused on RLE strengthening in both open and closed chain positions.  Pt able to use 2# weight and also red theraband for Rt LAQ exercise.  Progressed from 2" step to 4" step for RLE step up exercise for quad strengthening.  Pt performed step down exercise from 2" step - did not attempt this exercise with 4" step due to Rt quad weakness and decreased muscle endurance of Rt quads.  Pt able to perform step up exercise for approx. 7 reps onto 4" step and then had weakness with tremors in Rt quad for reps  8-10 with increased reliance on bil. Ue's needed to complete 10 reps.  Cont with POC.    OBJECTIVE IMPAIRMENTS: Abnormal gait, decreased balance, decreased coordination,  decreased endurance, decreased strength, and impaired tone.   ACTIVITY LIMITATIONS: carrying, bending, squatting, stairs, transfers, and locomotion level  PARTICIPATION LIMITATIONS: cleaning, laundry, shopping, and community activity  PERSONAL FACTORS: Behavior pattern, Past/current experiences, and Time since onset of injury/illness/exacerbation are also affecting patient's functional outcome.   REHAB POTENTIAL: Good  CLINICAL DECISION MAKING: Evolving/moderate complexity  EVALUATION COMPLEXITY: Moderate  PLAN:  PT FREQUENCY: 2x/week  PT DURATION: 8 weeks + eval   PLANNED INTERVENTIONS: Therapeutic exercises, Therapeutic activity, Neuromuscular re-education, Balance training, Gait training, Patient/Family education, Self Care, Stair training, Orthotic/Fit training, DME instructions, and Aquatic Therapy  PLAN FOR NEXT SESSION: start checking STG's:  begin step training: cont RLE strengthening;    Kalenna Millett, Celeste Cola, PT 03/11/2024, 3:26 PM

## 2024-03-11 ENCOUNTER — Ambulatory Visit: Payer: PPO | Admitting: Internal Medicine

## 2024-03-11 ENCOUNTER — Other Ambulatory Visit: Payer: Self-pay

## 2024-03-11 ENCOUNTER — Encounter: Payer: Self-pay | Admitting: Physical Therapy

## 2024-03-11 NOTE — Patient Instructions (Signed)
 Visit Information  Thank you for taking time to visit with me today. Please don't hesitate to contact me if I can be of assistance to you before our next scheduled telephone appointment.  Our next appointment is by telephone on 04/14/24 at 930am  Following is a copy of your care plan:   Goals Addressed             This Visit's Progress    VBCI RN Care Plan       Problems:  Chronic Disease Management support and education needs related to Back Pain  Goal: Over the next 6 months the Patient will demonstrate Improved adherence to prescribed treatment plan for back pain as evidenced by attending outpatient PT as ordered, rate back pain of 5 or less. 02/26/24 televisit w/ RNCM, pain remains higher than desired, at 7 out of pain scale 0-10. On 5/16 televisit w/ RNCM, pain level was much improved at and at desired level of <5, rating pain at 4 out of pain scale 0-10.  Interventions:   Pain Interventions: Pain assessment performed Medications reviewed Reviewed provider established plan for pain management Discussed importance of adherence to all scheduled medical appointments Counseled on the importance of reporting any/all new or changed pain symptoms or management strategies to pain management provider Discussed use of relaxation techniques and/or diversional activities to assist with pain reduction (distraction, imagery, relaxation, massage, acupressure, TENS, heat, and cold application Reviewed with patient prescribed pharmacological and nonpharmacological pain relief strategies Screening for signs and symptoms of depression related to chronic disease state  Assessed social determinant of health barriers  Patient Self-Care Activities:  Attend all scheduled provider appointments Call pharmacy for medication refills 3-7 days in advance of running out of medications Call provider office for new concerns or questions  Take medications as prescribed    Plan:  Telephone follow up  appointment with care management team member scheduled for:  Thursday, June 19 at 9:30am.          VBCI RN Care Plan       Problems:  Chronic Disease Management support and education needs related to Frequent Falls   Goal: Over the next 1 year the Patient will demonstrate ongoing self health care management ability to avoid injury as evidenced by    no falls.   Interventions:   Falls Interventions: Provided written and verbal education re: potential causes of falls and Fall prevention strategies Reviewed medications and discussed potential side effects of medications such as dizziness and frequent urination Assessed for signs and symptoms of orthostatic hypotension  Patient Self-Care Activities:  Take extra care when getting out of bed, sit on the bed for a few minutes to allow blood pressure to stabilize before ambulating. Perform same caution when going from sitting to standing.  Use hand rails when possible, be aware of surroundings when walking.   Plan:  The patient has been provided with contact information for the care management team and has been advised to call with any health related questions or concerns.  Next follow up appointment with RNCM is 04/14/24 @ 9:30 am Next follow up appointment with PCP is 04/13/24             Patient verbalizes understanding of instructions and care plan provided today and agrees to view in MyChart. Active MyChart status and patient understanding of how to access instructions and care plan via MyChart confirmed with patient.     The patient has been provided with contact information for the care  management team and has been advised to call with any health related questions or concerns.   Please call the care guide team at [phone] if you need to cancel or reschedule your appointment.   Please call 1-800-273-TALK (toll free, 24 hour hotline) if you are experiencing a Mental Health or Behavioral Health Crisis or need someone to talk  to.  Delayna Sparlin A. Saverio Curling RN, BA, [facility], CRRN [place]  [facility] Population Health RN Care Manager Direct Dial: [phone]  Fax: [phone]

## 2024-03-11 NOTE — Patient Outreach (Signed)
 Complex Care Management   Visit Note  03/11/2024  Name:  [name] MRN: [id number] DOB: Oct 31, 1960  Situation: Referral received for Complex Care Management related to Lumbar post-laminectomy syndrome, pain management, and high risk for falls. I obtained verbal consent from Patient.  Visit completed with Patient and RNCM  on the phone  Background:   Past Medical History:  Diagnosis Date   Anxiety    Aortic atherosclerosis (HCC) 04/16/2021   Atypical angina (HCC)    Back pain    related to spinal stenosis and disc problem, radiates down left buttocks to leg., weakness occ.   Chest pain    a. 03/2015 Cath: nl cors; b. 03/2021 Cor CTA: Ca2+ = 0. Nl Cors.   Chronic pain syndrome    Dyspnea    hx   GERD (gastroesophageal reflux disease)    Grade I diastolic dysfunction    Headache    Hyperlipidemia    Hypertension    Lumbar post-laminectomy syndrome    LVH (left ventricular hypertrophy) 12/15/2020   a. 11/2020 Echo: EF 65-70%, no rwma, sev asymm LVH with IVSd 1.9 cm. No LVOT obs @ rest. Gr1 DD. Triv MR.   PONV (postoperative nausea and vomiting)    Pulmonary nodules    a. 03/2021 CT Chest: 3mm pulm nodules in bilat lower lobes. F/u 1 yr.   Right foot drop    Syncope    a. 11/2020 Zio: No significant arrhythmias.   Vaginal foreign object    "Uses Femring "    Assessment: Patient Reported Symptoms:  Cognitive Cognitive Status: Alert and oriented to person, place, and time, Insightful and able to interpret abstract concepts, Normal speech and language skills   Health Maintenance Behaviors: Exercise, Social activities, Sleep adequate, Annual physical exam Health Facilitated by: Pain control, Prayer/meditation, Rest  Neurological Neurological Review of Symptoms: No symptoms reported    HEENT HEENT Symptoms Reported: Other: Other HEENT Symptoms/Conditions: seasonal allergic rhinitis symptoms "worse it's ever been this season" HEENT Management Strategies: Medication  therapy, Coping strategies, Adequate rest HEENT Self-Management Outcome: 4 (good)    Cardiovascular Cardiovascular Symptoms Reported: No symptoms reported Does patient have uncontrolled Hypertension?: No Is patient checking Blood Pressure at home?: Yes Patient's Recent BP reading at home: patient's BP is back down to normals 130's /70s-80's, followed up with cardiologist re chest pain she had experienced 2 weeks ago, MD believed it was stress related Cardiovascular Conditions: Hypertension, Cardiomyopathy, Heart failure Cardiovascular Management Strategies: Medication therapy, Adequate rest, Coping strategies Cardiovascular Self-Management Outcome: 4 (good) Cardiovascular Comment: back to baseline  Respiratory Respiratory Symptoms Reported: No symptoms reported Respiratory Conditions: Seasonal allergies Respiratory Self-Management Outcome: 4 (good) Respiratory Comment: taking Xyzal  for seasonal allergies  Endocrine Patient reports the following symptoms related to hypoglycemia or hyperglycemia : No symptoms reported Is patient diabetic?: No    Gastrointestinal Gastrointestinal Symptoms Reported: No symptoms reported      Genitourinary Genitourinary Symptoms Reported: No symptoms reported    Integumentary Integumentary Symptoms Reported: No symptoms reported    Musculoskeletal Musculoskelatal Symptoms Reviewed: Unsteady gait Musculoskeletal Conditions: Mobility limited, Unsteady gait, Back pain Other Musculoskeletal Conditions: Lumbar post-laminectomy syndrome, right foot drop. Sees Physical Therapist 2xs/week for post-surgical therapy. Wears a hard brace on her right foot for the foot drop and utilizes a cane for balance, antalgic gait, and also utilizes walker but prefers cane. Musculoskeletal Management Strategies: Medical device, Routine screening, Medication therapy, Coping strategies, Exercise, Activity Musculoskeletal Self-Management Outcome: 4 (good) Musculoskeletal Comment: Sees  PT "Ottie Blonder" for outpatient  PT twice weekly Falls in the past year?: Yes Number of falls in past year: 2 or more Was there an injury with Fall?: Yes Fall Risk Category Calculator: 3 Patient Fall Risk Level: High Fall Risk Patient at Risk for Falls Due to: History of fall(s), Impaired balance/gait, Impaired mobility, Medication side effect, Orthopedic patient Fall risk Follow up: Falls prevention discussed, Education provided, Falls evaluation completed  Psychosocial Psychosocial Symptoms Reported: No symptoms reported Additional Psychological Details: has hx of MDD and GAD, takes Cymbalta  30 mg daily Behavioral Health Conditions: Depression, Anxiety Behavioral Management Strategies: Medication therapy, Adequate rest, Coping strategies, Support system Behavioral Health Self-Management Outcome: 4 (good) Major Change/Loss/Stressor/Fears (CP): Medical condition, self Techniques to Cope with Loss/Stress/Change: Diversional activities, Exercise, Medication Quality of Family Relationships: involved, helpful, supportive Do you feel physically threatened by others?: No      02/11/2024    9:57 AM  Depression screen PHQ 2/9  Decreased Interest 0  Down, Depressed, Hopeless 0  PHQ - 2 Score 0    Vitals:   03/11/24 0953  BP: 130/80  Pulse: 80    Medications Reviewed Today     Reviewed by Randye Buttner, RN (Registered Nurse) on 03/11/24 at [phone]  Med List Status: <None>   Medication Order Taking? Sig Documenting Provider Last Dose Status Informant  acetaminophen  (TYLENOL ) 500 MG tablet [id number] Yes Take 500 mg by mouth every 6 (six) hours as needed for headache. [provider] Taking Active Self  ALPRAZolam  (XANAX ) 0.5 MG tablet [id number] Yes Take 1/2 - 1 tablet by mouth at bedtime as needed for anxiety. [place], [name], MD Taking Active Self  amitriptyline  (ELAVIL ) 25 MG tablet [id number] Yes Take 1 tablet (25 mg total) by mouth daily.  Taking Active Self  amLODipine  (NORVASC )  5 MG tablet [id number] Yes Take 1 tablet (5 mg total) by mouth daily. Hazle Lites, MD Taking Active Self  atorvastatin  (LIPITOR) 20 MG tablet [id number] Yes Take 1 tablet (20 mg total) by mouth daily. [place], [name], MD Taking Active Self  baclofen  (LIORESAL ) 10 MG tablet [id number] Yes Take 1 tablet (10 mg total) by mouth 3 (three) times daily as needed. Rawland Caddy, MD Taking Active Self           Med Note Shann Darnel, [place] A   Mon Nov 30, 2023  1:44 PM) Pt alternates takes baclofen  and methocarbamol    betamethasone  dipropionate 0.05 % cream [id number] Yes Apply as directed to skin once a day to itchy areas only avoiding normal skin as needed  Taking Active   carvedilol  (COREG ) 25 MG tablet [id number]  Take 1 tablet (25 mg total) by mouth 2 (two) times daily with a meal. Clearnce Curia, NP  Expired 12/25/23 2359 Self           Med Note (ROSE, AMANDA U   Fri Jan 15, 2024 10:28 AM) Continues to take  cholecalciferol  (VITAMIN D3) 25 MCG (1000 UNIT) tablet [id number] Yes Take 1,000 Units by mouth daily. [provider] Taking Active Self  cromolyn  (OPTICROM ) 4 % ophthalmic solution [id number] Yes Place 1-2 drops into both eyes 4 (four) times daily as needed. [place], [name], MD Taking Active   diphenhydrAMINE  (BENADRYL ) 25 MG tablet [id number] Yes Take 25 mg by mouth daily as needed for itching. [provider] Taking Active Self  docusate sodium  (COLACE) 100 MG capsule [id number] Yes Take 100 mg by mouth 2 (two) times daily. [provider] Taking Active   DULoxetine  (CYMBALTA )  30 MG capsule [id number] Yes Take 2 capsules (60 mg total) by mouth daily. Rawland Caddy, MD Taking Active Self           Med Note (ROSE, Minna Amass Dec 14, 2023  9:30 AM) Patient is taking 30 mg once a day.   Estradiol  Acetate (FEMRING ) 0.05 MG/24HR RING [id number] Yes USE ONE RING VAGINALLY EVERY 3 MONTHS AS DIRECTED  Taking Active Self  fluconazole  (DIFLUCAN ) 200 MG tablet [id number] Yes  Take 1 tablet by mouth once a week  Taking Active Self  gabapentin  (NEURONTIN ) 600 MG tablet [id number] Yes Take 1 tablet (600 mg total) by mouth 3 (three) times daily. Rawland Caddy, MD Taking Active Self  hydrALAZINE  (APRESOLINE ) 50 MG tablet [id number]  Take 1 tablet (50 mg total) by mouth 3 (three) times daily.  Patient taking differently: Take 50 mg by mouth 2 (two) times daily.   [place], Katlyn D, NP  Expired 02/11/24 2359 Self           Med Note (ROSE, AMANDA U   Mon Dec 14, 2023  9:31 AM) Reports taking 2 times per day  levocetirizine (XYZAL  ALLERGY 24HR) 5 MG tablet [id number] Yes Take 1 tablet (5 mg total) by mouth every evening. [place], [name], MD Taking Active            Med Note Saverio Curling, Merve Hotard A   Fri Feb 26, 2024  9:13 AM) Had allergic reaction, itched all over body  methocarbamol  (ROBAXIN ) 500 MG tablet [id number] Yes Take 1 tablet (500 mg total) by mouth 3 (three) times daily as needed for muscle spasm. Meyran, Kimberly Hannah, NP Taking Active   mometasone  (NASONEX ) 50 MCG/ACT nasal spray [id number] Yes Place 2 sprays into the nose daily. [place], [name], MD Taking Active     Discontinued 09/05/23 1325 (Patient Preference)            Med Note>> Hollice Luo, CPhT   09/05/2023  1:25 PM non longer taking    ondansetron  (ZOFRAN ) 4 MG tablet [id number]  Take 1 tablet (4 mg tablet) 30 - 60 minutes each prep dose Albertina Hugger, MD  Active Self           Med Note (ROSE, AMANDA U   Wed Jan 27, 2024 11:11 AM) Old RX.  No refills.  pregabalin  (LYRICA ) 300 MG capsule [id number]  Take 1 capsule (300 mg total) by mouth 2 (two) times daily. Cassandra Cleveland, MD  Expired 02/03/24 2359 Self  spironolactone  (ALDACTONE ) 50 MG tablet [id number] Yes Take 1 tablet (50 mg total) by mouth daily. Hazle Lites, MD Taking Active   tapentadol  (NUCYNTA ) 50 MG tablet [id number] Yes Take 1 tablet (50 mg total) by mouth every 6 (six) to 8 (eight) hours as needed.  Taking Active            Med Note  Saverio Curling, Glennys Schorsch A   Fri Feb 26, 2024  9:10 AM) Did obtain prior auth  terbinafine (LAMISIL) 1 % cream [id number] Yes Apply 1 Application topically 2 (two) times daily as needed (rash). [provider] Taking Active Self            Recommendation:   PCP Follow-up 04/13/24 with Dr. Betty [place]  Follow Up Plan:   Telephone follow up appointment date/time:  04/14/24 @ 9:30am  Bartholomew Light A. Saverio Curling RN, BA, [facility], CRRN [place]  [facility] Population Health RN Care Manager Direct Dial: [phone]  Fax: [phone]

## 2024-03-17 ENCOUNTER — Ambulatory Visit: Admitting: Physical Therapy

## 2024-03-17 DIAGNOSIS — R2689 Other abnormalities of gait and mobility: Secondary | ICD-10-CM

## 2024-03-17 DIAGNOSIS — M6281 Muscle weakness (generalized): Secondary | ICD-10-CM | POA: Diagnosis not present

## 2024-03-17 NOTE — Therapy (Signed)
 OUTPATIENT PHYSICAL THERAPY NEURO TREATMENT NOTE   Patient Name: [name] MRN: [id number] DOB:[birth year], 63 y.o., female Today's Date: 03/18/2024   PCP: [place], [name]., MD REFERRING PROVIDER: Rawland Caddy, MD  END OF SESSION:  PT End of Session - 03/18/24 1753     Visit Number 6    Number of Visits 17    Date for PT Re-Evaluation 04/15/24    Authorization Type [employer]    Authorization Time Period 02-16-24 -04-25-24    Progress Note Due on Visit 10    PT Start Time 1016    PT Stop Time 1100    PT Time Calculation (min) 44 min    Equipment Utilized During Treatment Gait belt    Activity Tolerance Other (comment);Patient tolerated treatment well   limited by c/o back pain   Behavior During Therapy [facility] for tasks assessed/performed                   Past Medical History:  Diagnosis Date   Anxiety    Aortic atherosclerosis (HCC) 04/16/2021   Atypical angina (HCC)    Back pain    related to spinal stenosis and disc problem, radiates down left buttocks to leg., weakness occ.   Chest pain    a. 03/2015 Cath: nl cors; b. 03/2021 Cor CTA: Ca2+ = 0. Nl Cors.   Chronic pain syndrome    Dyspnea    hx   GERD (gastroesophageal reflux disease)    Grade I diastolic dysfunction    Headache    Hyperlipidemia    Hypertension    Lumbar post-laminectomy syndrome    LVH (left ventricular hypertrophy) 12/15/2020   a. 11/2020 Echo: EF 65-70%, no rwma, sev asymm LVH with IVSd 1.9 cm. No LVOT obs @ rest. Gr1 DD. Triv MR.   PONV (postoperative nausea and vomiting)    Pulmonary nodules    a. 03/2021 CT Chest: 3mm pulm nodules in bilat lower lobes. F/u 1 yr.   Right foot drop    Syncope    a. 11/2020 Zio: No significant arrhythmias.   Vaginal foreign object    "Uses Femring "   Past Surgical History:  Procedure Laterality Date   ABDOMINAL HYSTERECTOMY     ANTERIOR CERVICAL DECOMP/DISCECTOMY FUSION N/A 01/03/2022   Procedure: Anterior Cervical  Decompression Fusion - Cervical four-Cervical five - Cervical five-Cervical six;  Surgeon: Isadora Mar, MD;  Location: [facility] OR;  Service: Neurosurgery;  Laterality: N/A;   BIOPSY  12/16/2020   Procedure: BIOPSY;  Surgeon: Asencion Blacksmith, MD;  Location: [facility] ENDOSCOPY;  Service: Endoscopy;;   CARDIAC CATHETERIZATION N/A 04/18/2015   Procedure: Left Heart Cath and Coronary Angiography;  Surgeon: Chapman Commodore, MD;  Location: [facility] INVASIVE CV LAB;  Service: Cardiovascular;  Laterality: N/A;   COLONOSCOPY     COLONOSCOPY W/ BIOPSIES AND POLYPECTOMY  2018   ESOPHAGOGASTRODUODENOSCOPY (EGD) WITH PROPOFOL  N/A 12/16/2020   Procedure: ESOPHAGOGASTRODUODENOSCOPY (EGD) WITH PROPOFOL ;  Surgeon: Asencion Blacksmith, MD;  Location: [facility] ENDOSCOPY;  Service: Endoscopy;  Laterality: N/A;   FOOT SURGERY Bilateral    [facility] "bunion,bone spur, tendon" (1) -6'16, (1)-10'16   HEMATOMA EVACUATION N/A 01/05/2022   Procedure: Cervical Wound Exploration;  Surgeon: Audie Bleacher, MD;  Location: [facility] OR;  Service: Neurosurgery;  Laterality: N/A;   IR EPIDUROGRAPHY  07/21/2018   LUMBAR LAMINECTOMY/DECOMPRESSION MICRODISCECTOMY Bilateral 12/28/2015   Procedure: MICRO LUMBAR DECOMPRESSION L4 - L5 BILATERALLY;  Surgeon: Orvan Blanch, MD;  Location: [facility] ORS;  Service:  Orthopedics;  Laterality: Bilateral;   LUMBAR LAMINECTOMY/DECOMPRESSION MICRODISCECTOMY Bilateral 03/04/2018   Procedure: Revision of Microlumbar Decompression Bilateral Lumbar Four-Five;  Surgeon: Orvan Blanch, MD;  Location: [facility] OR;  Service: Orthopedics;  Laterality: Bilateral;  90 mins   LUMBAR WOUND DEBRIDEMENT N/A 12/25/2023   Procedure: Irrigation and debridement of lumbar wound;  Surgeon: Joaquin Mulberry, MD;  Location: [facility] OR;  Service: Neurosurgery;  Laterality: N/A;   POSTERIOR FUSION PEDICLE SCREW PLACEMENT N/A 12/07/2023   Procedure: LUMBAR FOUR-FIVE POSTERIOR LATERAL FUSION;  Surgeon: Joaquin Mulberry, MD;  Location: [facility] OR;  Service:  Neurosurgery;  Laterality: N/A;   SAVORY DILATION N/A 12/16/2020   Procedure: SAVORY DILATION;  Surgeon: Asencion Blacksmith, MD;  Location: [facility] ENDOSCOPY;  Service: Endoscopy;  Laterality: N/A;   SPINAL CORD STIMULATOR INSERTION N/A 09/28/2019   Procedure: THORACIC SPINAL CORD STIMULATOR INSERTION;  Surgeon: Mort Ards, MD;  Location: [facility] OR;  Service: Orthopedics;  Laterality: N/A;  2.5 hrs   SPINAL CORD STIMULATOR REMOVAL N/A 05/27/2021   Procedure: LUMBAR SPINAL CORD STIMULATOR REMOVAL;  Surgeon: Berta Brittle, MD;  Location: [facility] ORS;  Service: Neurosurgery;  Laterality: N/A;   TUBAL LIGATION     WISDOM TOOTH EXTRACTION     WOUND EXPLORATION N/A 03/04/2018   Procedure: EXPLORATION OF LUMBAR DECOMPRESSION WOUND;  Surgeon: Orvan Blanch, MD;  Location: [facility] OR;  Service: Orthopedics;  Laterality: N/A;   Patient Active Problem List   Diagnosis Date Noted   Seasonal allergic rhinitis due to pollen 01/29/2024   Delayed wound healing 12/25/2023   S/P lumbar fusion 12/07/2023   Pain in thumb joint with movement of right hand 08/12/2023   Localized primary osteoarthritis of carpometacarpal joint of right thumb 07/29/2023   Right foot drop 07/29/2023   Frequent falls 04/14/2023   Gastroesophageal reflux disease 04/14/2023   Right upper extremity numbness 09/29/2022   Stage 3a chronic kidney disease (HCC) 09/29/2022   Cervical spondylosis with radiculopathy 01/05/2022   S/P cervical spinal fusion 01/03/2022   Cervical disc disorder with radiculopathy of cervical region 11/27/2021   Uncontrolled pain 05/27/2021   Syncope 05/13/2021   Depression with anxiety 05/13/2021   Chronic diastolic CHF (congestive heart failure) (HCC) 05/13/2021   Chronic back pain 05/13/2021   Chest tightness 05/13/2021   Lower abdominal pain 05/13/2021   Syncope and collapse 01/01/2021   Abnormal echocardiogram 01/01/2021   BPPV (benign paroxysmal positional vertigo), left 12/26/2020   Odynophagia    Dysphagia  12/15/2020   Postural dizziness with presyncope 12/14/2020   Lumbar post-laminectomy syndrome    Chronic pain 09/28/2019   Lumbar spinal stenosis    Radicular pain    Gait disorder    Difficulty in urination 07/13/2018   Back pain 07/12/2018   Hyperlipidemia 04/16/2018   GAD (generalized anxiety disorder) 04/16/2018   AKI (acute kidney injury) (HCC)    Benign essential HTN    Major depression, recurrent (HCC)    Lumbar radiculopathy 03/09/2018   Paraparesis (HCC) 03/09/2018   Essential hypertension    Chronic nonintractable headache    Leukocytosis    Acute blood loss anemia    Postoperative pain    Generalized weakness    Spinal stenosis at L4-L5 level 03/04/2018   Spinal stenosis of lumbar region 12/28/2015   Chest pain 04/16/2015    ONSET DATE: Most recent Surgery date 12-07-23:  Referral date 02-03-24  REFERRING DIAG:  M54.16 (ICD-10-CM) - Lumbar radiculopathy M96.1 (ICD-10-CM) - Lumbar post-laminectomy syndrome R26.9 (ICD-10-CM) - Gait disorder  THERAPY  DIAG:  Muscle weakness (generalized)  Other abnormalities of gait and mobility  Rationale for Evaluation and Treatment: Rehabilitation  SUBJECTIVE:                                                                                                                                                                                             SUBJECTIVE STATEMENT: Pt reports she did a squat at her kitchen counter (without thinking about what she was doing)- did not have her blue rocker AFO on her RLE- went too low with the squat and was unable to return to standing so she lowered herself to the ground; was able to sit there a few minutes and then pull herself up to standing to get up off the floor.  Pt reports having increased back discomfort (soreness) today due to this strain with this activity  Pt accompanied by: self  PERTINENT HISTORY: The patient was admitted on 12/07/2023 &  underwent PLIF L4-5. Hospitalized 12-07-23 -  12-11-23:  The patient was admitted on 12/25/2023 and taken to the operating room where the patient underwent irrigation and debridement of lumbar wound with primary closure and placement of external wound VAC- hospitalized 12-25-23 -  12-26-23  Frequent falls:  Spinal cord stimulator insertion on 09-28-19; stimulator removal on 05-27-21 with resultant LLE weakness;  decompression and laminectomy on 12/28/15 at L4-L5  with redo decompression surgery on May 9,2019, post-op paraparesis with surgery to remove small hematoma; 07/12/18 fall at work (involved rolling chair) with hospital admission, transfer to CIR with d/c home 08/03/18; HTN, Bil foot surgery; left ventricular hypertrophy, chronic pain syndrome, dyspnea, hypertrophic cardiomyopathy; cervical fusion March 2023;  had carpal tunnel surgery RUE in April 2024:  LVH  PAIN:  Are you having pain? Pt rates pain 5/10 in lumbar region - greater pain in lower lumbar region near sacral area Alleviating Factor:  placing pillow behind back when sitting Aggravating Factors: certain movements    PRECAUTIONS: Fall; No bending/lifting or twisting; pt states she has a reacher  WEIGHT BEARING RESTRICTIONS: No  FALLS: Has patient fallen in last 6 months? Yes. Number of falls 3  LIVING ENVIRONMENT: Lives with: lives with their spouse Lives in: House/apartment Stairs: Yes: External: 5 steps; on right going up Has following equipment at home: Single point cane, Wheelchair (manual), and L[place]strand crutch  PLOF: Independent with basic ADLs, Independent with household mobility with device, Independent with community mobility with device, and Independent with transfers  PATIENT GOALS: improve walking and strength Rt leg  OBJECTIVE:   DIAGNOSTIC FINDINGS: IMPRESSION: 1. Interval anterior discectomy and fusion from C4 through C6 with improved patency of  the spinal canal and only mild residual foraminal narrowing. 2. Stable mild spinal stenosis and mild to moderate  left foraminal narrowing at C3-4. 3. Stable mild spinal stenosis and mild foraminal narrowing bilaterally at C6-7. Mild foraminal narrowing bilaterally at C7-T1. 4. No cord deformity or abnormal cord signal.  No acute findings. No recent MRI of lumbar region  COGNITION: Overall cognitive status: Within functional limits for tasks assessed   SENSATION: WFL  COORDINATION: Decreased RLE due to weakness and spasticity  POSTURE: No Significant postural limitations  LOWER EXTREMITY ROM:   LLE WFL's;  RLE WFL's except for ankle ROM which is minimal due to weakness; Pt has difficulty performing Rt knee extension due to Rt quad weakness and spasticity - turned body slightly to Lt to achieve more knee extension   LOWER EXTREMITY MMT:    MMT Right Eval Left Eval  Hip flexion 3- 3+   Hip extension    Hip abduction    Hip adduction    Hip internal rotation    Hip external rotation    Knee flexion 3+   Knee extension 4- 4  Ankle dorsiflexion 2-   Ankle plantarflexion    Ankle inversion    Ankle eversion    (Blank rows = not tested)  BED MOBILITY:  Modified independent  TRANSFERS: Assistive device utilized: Rollator  Sit to stand: Modified independence Stand to sit: Modified independence   STAIRS:  Not tested due to c/o back pain   GAIT: Gait pattern: decreased step length- Right, decreased stance time- Right, decreased hip/knee flexion- Right, and decreased ankle dorsiflexion- Right Distance walked: 50' Assistive device utilized: with rollator Level of assistance: SBA and CGA Comments: slow gait speed due to c/o back pain and RLE weakness  FUNCTIONAL TESTS:  Sit to stand score - 2 reps - with bil. UE support from chair- 29.91 secs             TUG score: - 49.19 secs with rollator  Gait velocity:  37.38 secs = .88 ft/sec with rollator  PATIENT SURVEYS:  N/A - based on diagnosis of RLE weakness due to chronic lumbar radiculopathy  TODAY'S TREATMENT: 03-17-24                                                                                                                            TherEx: 5x sit to stand with bil. UE support from chair (for STG assessment)  Scifit level 2.5 - bil. UE and LE's x 5"    TherAct: Step ups RLE - onto 2" step 10 reps inside // bars  Tap ups with LLE onto 2" step 10 reps for RLE weight bearing - cues to keep Rt knee slightly flexed Tap ups with RLE to 4" step - 5 reps x 1 set;  2nd set 5 reps - for Rt hip and hamstring strengthening  Step ups RLE onto 2" step 10 reps  2" step 10 reps LLE - RLE on  floor with knee flexed bil. UE support;  10 reps with LUE support only  Step down from 2" step for Rt quad eccentric strengthening 10 reps    Stepping forward/back 10 reps LLE with cues to keep Rt knee slightly flexed:  1/2 marching LLE 10 reps for increased RLE weight bearing  TUG score:  34.10 secs with rollator    Gait: pt using rollator and wearing Blue rocker AFO on RLE - pt modified independent with use of rollator  Pt gait trained 115' x 1 rep with rollator with SBA (for STG assessment)  Gait velocity:   50.62 secs with rollator = .02 ft/sec with rollator (due to c/o back pain)           PATIENT EDUCATION: Education details:  reviewed previous HEPill  Person educated: Patient Education method: Explanation - handout declined as pt stated she has it from previous OP admission Education comprehension: verbalized understanding  HOME EXERCISE PROGRAM:   GOALS: Goals reviewed with patient? Yes  SHORT TERM GOALS: Target date:  03-15-24   Pt will increase gait velocity to >/= 1.2 ft/sec with use of rollator for incr. Gait efficiency. Baseline: 37.38 secs = .88 ft/sec with rollator   50.62 secs with rollator = .02 ft/sec with rollator (due to c/o back pain) Goal status: Not met 03-17-24  2.  Amb. 115' with RW with SBA without requiring seated rest period. Baseline:  Goal status: Goal met 03-17-24  3.   Perform 5x sit to stand with bil. UE support with pt demonstrating equal weight bearing on each leg. Baseline: 2x with bil. UE support - due to c/o back pain Goal status: Goal met 03-17-24  4.  Improved TUG score to </= 35 secs with use of rollator for improved mobility and reduced fall risk. Baseline: 49.19 secs with rollator;  34.10 secs with rollator - 03-17-24 Goal status: Goal met 03-17-24  5.  Negotiate 4 steps with CGA with use of bil. Hand rails Baseline:  Goal status: Not tested on 03-17-24 per pt's request due to increased back pain/soreness  6.  Independent in HEP for RLE strengthening.  Baseline:  Goal status: Goal met 03-17-24    LONG TERM GOALS: Target date: 04-15-24  Amb. 230' with RW with SBA without requiring seated rest period. Baseline:  Goal status: INITIAL  2.  Improve TUG score to </= 28 secs  with use of RW for improved mobility and reduced fall risk. Baseline: 49.19 secs with rollator Goal status: INITIAL  3.  Perform 5x sit to stand transfers with LUE support only to demo improved strength in RLE.  Baseline:  Goal status: INITIAL  4.  Modified independent step negotiation with use of bil. Hand rails using step by step sequence. Baseline:  Goal status: INITIAL  5.  Increase gait velocity to >/= 1.6 ft/sec with rollator for increased gait efficiency with reduced fall risk.          Baseline: 37.38 secs = .88 ft/sec with rollator          Goal status:  INITIAL   6.  Independent in updated HEP for RLE strengthening. Baseline:  Goal status: INITIAL   7.  Report reduced low back pain at rest to </= 3/10 intensity.            Baseline:  7/10            Goal status:  INITIAL  ASSESSMENT:  CLINICAL IMPRESSION: PT session focused on STG and RLE strengthening  in closed chain position.  Pt has met STG's #2,3,4 and 6:  STG #1 not met due to significant decrease in gait velocity in today's session due to increased back discomfort due to strain with squat  exercise performed at home 2 days ago.  STG #5 (step negotiation) not tested due to pt's request due to c/o increased back pain due to strain due to squat exercise as reported earlier.  Decreased step height from 4" to 2" in today's session due to c/o increased back soreness.  Cont with POC.    OBJECTIVE IMPAIRMENTS: Abnormal gait, decreased balance, decreased coordination, decreased endurance, decreased strength, and impaired tone.   ACTIVITY LIMITATIONS: carrying, bending, squatting, stairs, transfers, and locomotion level  PARTICIPATION LIMITATIONS: cleaning, laundry, shopping, and community activity  PERSONAL FACTORS: Behavior pattern, Past/current experiences, and Time since onset of injury/illness/exacerbation are also affecting patient's functional outcome.   REHAB POTENTIAL: Good  CLINICAL DECISION MAKING: Evolving/moderate complexity  EVALUATION COMPLEXITY: Moderate  PLAN:  PT FREQUENCY: 2x/week  PT DURATION: 8 weeks + eval   PLANNED INTERVENTIONS: Therapeutic exercises, Therapeutic activity, Neuromuscular re-education, Balance training, Gait training, Patient/Family education, Self Care, Stair training, Orthotic/Fit training, DME instructions, and Aquatic Therapy  PLAN FOR NEXT SESSION: begin step training: cont RLE strengthening;    Dorsie Gaunt, PT 03/18/2024, 5:57 PM

## 2024-03-18 ENCOUNTER — Encounter: Payer: Self-pay | Admitting: Physical Therapy

## 2024-03-22 ENCOUNTER — Ambulatory Visit: Admitting: Physical Therapy

## 2024-03-22 DIAGNOSIS — Z79890 Hormone replacement therapy (postmenopausal): Secondary | ICD-10-CM | POA: Diagnosis not present

## 2024-03-22 DIAGNOSIS — N951 Menopausal and female climacteric states: Secondary | ICD-10-CM | POA: Diagnosis not present

## 2024-03-22 DIAGNOSIS — Z01411 Encounter for gynecological examination (general) (routine) with abnormal findings: Secondary | ICD-10-CM | POA: Diagnosis not present

## 2024-03-22 DIAGNOSIS — R2689 Other abnormalities of gait and mobility: Secondary | ICD-10-CM

## 2024-03-22 DIAGNOSIS — M6281 Muscle weakness (generalized): Secondary | ICD-10-CM

## 2024-03-22 NOTE — Therapy (Unsigned)
 OUTPATIENT PHYSICAL THERAPY NEURO TREATMENT NOTE   Patient Name: [name] MRN: [id number] DOB:May 27, 1961, 63 y.o., female Today's Date: 03/23/2024   PCP: [place], [name]., MD REFERRING PROVIDER: Rawland Caddy, MD  END OF SESSION:  PT End of Session - 03/23/24 1901     Visit Number 7    Number of Visits 17    Date for PT Re-Evaluation 04/15/24    Authorization Type [employer]    Authorization Time Period 02-16-24 -04-25-24    Progress Note Due on Visit 10    PT Start Time 1017    PT Stop Time 1103    PT Time Calculation (min) 46 min    Equipment Utilized During Treatment Gait belt    Activity Tolerance Other (comment);Patient tolerated treatment well   limited by c/o back pain   Behavior During Therapy [facility] for tasks assessed/performed                    Past Medical History:  Diagnosis Date   Anxiety    Aortic atherosclerosis (HCC) 04/16/2021   Atypical angina (HCC)    Back pain    related to spinal stenosis and disc problem, radiates down left buttocks to leg., weakness occ.   Chest pain    a. 03/2015 Cath: nl cors; b. 03/2021 Cor CTA: Ca2+ = 0. Nl Cors.   Chronic pain syndrome    Dyspnea    hx   GERD (gastroesophageal reflux disease)    Grade I diastolic dysfunction    Headache    Hyperlipidemia    Hypertension    Lumbar post-laminectomy syndrome    LVH (left ventricular hypertrophy) 12/15/2020   a. 11/2020 Echo: EF 65-70%, no rwma, sev asymm LVH with IVSd 1.9 cm. No LVOT obs @ rest. Gr1 DD. Triv MR.   PONV (postoperative nausea and vomiting)    Pulmonary nodules    a. 03/2021 CT Chest: 3mm pulm nodules in bilat lower lobes. F/u 1 yr.   Right foot drop    Syncope    a. 11/2020 Zio: No significant arrhythmias.   Vaginal foreign object    "Uses Femring "   Past Surgical History:  Procedure Laterality Date   ABDOMINAL HYSTERECTOMY     ANTERIOR CERVICAL DECOMP/DISCECTOMY FUSION N/A 01/03/2022   Procedure: Anterior Cervical  Decompression Fusion - Cervical four-Cervical five - Cervical five-Cervical six;  Surgeon: Isadora Mar, MD;  Location: [facility] OR;  Service: Neurosurgery;  Laterality: N/A;   BIOPSY  12/16/2020   Procedure: BIOPSY;  Surgeon: Asencion Blacksmith, MD;  Location: [facility] ENDOSCOPY;  Service: Endoscopy;;   CARDIAC CATHETERIZATION N/A 04/18/2015   Procedure: Left Heart Cath and Coronary Angiography;  Surgeon: Chapman Commodore, MD;  Location: [facility] INVASIVE CV LAB;  Service: Cardiovascular;  Laterality: N/A;   COLONOSCOPY     COLONOSCOPY W/ BIOPSIES AND POLYPECTOMY  2018   ESOPHAGOGASTRODUODENOSCOPY (EGD) WITH PROPOFOL  N/A 12/16/2020   Procedure: ESOPHAGOGASTRODUODENOSCOPY (EGD) WITH PROPOFOL ;  Surgeon: Asencion Blacksmith, MD;  Location: [facility] ENDOSCOPY;  Service: Endoscopy;  Laterality: N/A;   FOOT SURGERY Bilateral    [facility] "bunion,bone spur, tendon" (1) -6'16, (1)-10'16   HEMATOMA EVACUATION N/A 01/05/2022   Procedure: Cervical Wound Exploration;  Surgeon: Audie Bleacher, MD;  Location: [facility] OR;  Service: Neurosurgery;  Laterality: N/A;   IR EPIDUROGRAPHY  07/21/2018   LUMBAR LAMINECTOMY/DECOMPRESSION MICRODISCECTOMY Bilateral 12/28/2015   Procedure: MICRO LUMBAR DECOMPRESSION L4 - L5 BILATERALLY;  Surgeon: Orvan Blanch, MD;  Location: [facility] ORS;  Service: Orthopedics;  Laterality: Bilateral;   LUMBAR LAMINECTOMY/DECOMPRESSION MICRODISCECTOMY Bilateral 03/04/2018   Procedure: Revision of Microlumbar Decompression Bilateral Lumbar Four-Five;  Surgeon: Orvan Blanch, MD;  Location: [facility] OR;  Service: Orthopedics;  Laterality: Bilateral;  90 mins   LUMBAR WOUND DEBRIDEMENT N/A 12/25/2023   Procedure: Irrigation and debridement of lumbar wound;  Surgeon: Joaquin Mulberry, MD;  Location: [facility] OR;  Service: Neurosurgery;  Laterality: N/A;   POSTERIOR FUSION PEDICLE SCREW PLACEMENT N/A 12/07/2023   Procedure: LUMBAR FOUR-FIVE POSTERIOR LATERAL FUSION;  Surgeon: Joaquin Mulberry, MD;  Location: [facility] OR;  Service:  Neurosurgery;  Laterality: N/A;   SAVORY DILATION N/A 12/16/2020   Procedure: SAVORY DILATION;  Surgeon: Asencion Blacksmith, MD;  Location: [facility] ENDOSCOPY;  Service: Endoscopy;  Laterality: N/A;   SPINAL CORD STIMULATOR INSERTION N/A 09/28/2019   Procedure: THORACIC SPINAL CORD STIMULATOR INSERTION;  Surgeon: Mort Ards, MD;  Location: [facility] OR;  Service: Orthopedics;  Laterality: N/A;  2.5 hrs   SPINAL CORD STIMULATOR REMOVAL N/A 05/27/2021   Procedure: LUMBAR SPINAL CORD STIMULATOR REMOVAL;  Surgeon: Berta Brittle, MD;  Location: [facility] ORS;  Service: Neurosurgery;  Laterality: N/A;   TUBAL LIGATION     WISDOM TOOTH EXTRACTION     WOUND EXPLORATION N/A 03/04/2018   Procedure: EXPLORATION OF LUMBAR DECOMPRESSION WOUND;  Surgeon: Orvan Blanch, MD;  Location: [facility] OR;  Service: Orthopedics;  Laterality: N/A;   Patient Active Problem List   Diagnosis Date Noted   Seasonal allergic rhinitis due to pollen 01/29/2024   Delayed wound healing 12/25/2023   S/P lumbar fusion 12/07/2023   Pain in thumb joint with movement of right hand 08/12/2023   Localized primary osteoarthritis of carpometacarpal joint of right thumb 07/29/2023   Right foot drop 07/29/2023   Frequent falls 04/14/2023   Gastroesophageal reflux disease 04/14/2023   Right upper extremity numbness 09/29/2022   Stage 3a chronic kidney disease (HCC) 09/29/2022   Cervical spondylosis with radiculopathy 01/05/2022   S/P cervical spinal fusion 01/03/2022   Cervical disc disorder with radiculopathy of cervical region 11/27/2021   Uncontrolled pain 05/27/2021   Syncope 05/13/2021   Depression with anxiety 05/13/2021   Chronic diastolic CHF (congestive heart failure) (HCC) 05/13/2021   Chronic back pain 05/13/2021   Chest tightness 05/13/2021   Lower abdominal pain 05/13/2021   Syncope and collapse 01/01/2021   Abnormal echocardiogram 01/01/2021   BPPV (benign paroxysmal positional vertigo), left 12/26/2020   Odynophagia    Dysphagia  12/15/2020   Postural dizziness with presyncope 12/14/2020   Lumbar post-laminectomy syndrome    Chronic pain 09/28/2019   Lumbar spinal stenosis    Radicular pain    Gait disorder    Difficulty in urination 07/13/2018   Back pain 07/12/2018   Hyperlipidemia 04/16/2018   GAD (generalized anxiety disorder) 04/16/2018   AKI (acute kidney injury) (HCC)    Benign essential HTN    Major depression, recurrent (HCC)    Lumbar radiculopathy 03/09/2018   Paraparesis (HCC) 03/09/2018   Essential hypertension    Chronic nonintractable headache    Leukocytosis    Acute blood loss anemia    Postoperative pain    Generalized weakness    Spinal stenosis at L4-L5 level 03/04/2018   Spinal stenosis of lumbar region 12/28/2015   Chest pain 04/16/2015    ONSET DATE: Most recent Surgery date 12-07-23:  Referral date 02-03-24  REFERRING DIAG:  M54.16 (ICD-10-CM) - Lumbar radiculopathy M96.1 (ICD-10-CM) - Lumbar post-laminectomy syndrome R26.9 (ICD-10-CM) - Gait disorder  THERAPY DIAG:  Muscle weakness (generalized)  Other abnormalities of gait and mobility  Rationale for Evaluation and Treatment: Rehabilitation  SUBJECTIVE:                                                                                                                                                                                             SUBJECTIVE STATEMENT: Pt reports she didn't do much on Sunday but laid around with ice on her back; pt reports she didn't feel real well so stayed in bed a lot of the day with ice on her back.  Called Dr. Rochelle Chu' nurse last Friday and informed her of the continued burning pain in her back - nurse reported that was part of the healing process Pt accompanied by: self  PERTINENT HISTORY: The patient was admitted on 12/07/2023 &  underwent PLIF L4-5. Hospitalized 12-07-23 - 12-11-23:  The patient was admitted on 12/25/2023 and taken to the operating room where the patient underwent irrigation and  debridement of lumbar wound with primary closure and placement of external wound VAC- hospitalized 12-25-23 -  12-26-23  Frequent falls:  Spinal cord stimulator insertion on 09-28-19; stimulator removal on 05-27-21 with resultant LLE weakness;  decompression and laminectomy on 12/28/15 at L4-L5  with redo decompression surgery on May 9,2019, post-op paraparesis with surgery to remove small hematoma; 07/12/18 fall at work (involved rolling chair) with hospital admission, transfer to CIR with d/c home 08/03/18; HTN, Bil foot surgery; left ventricular hypertrophy, chronic pain syndrome, dyspnea, hypertrophic cardiomyopathy; cervical fusion March 2023;  had carpal tunnel surgery RUE in April 2024:  LVH  PAIN:  Are you having pain? Pt rates pain 5/10 in lumbar region - greater pain in lower lumbar region near sacral area Alleviating Factor:  placing pillow behind back when sitting Aggravating Factors: certain movements    PRECAUTIONS: Fall; No bending/lifting or twisting; pt states she has a reacher  WEIGHT BEARING RESTRICTIONS: No  FALLS: Has patient fallen in last 6 months? Yes. Number of falls 3  LIVING ENVIRONMENT: Lives with: lives with their spouse Lives in: House/apartment Stairs: Yes: External: 5 steps; on right going up Has following equipment at home: Single point cane, Wheelchair (manual), and Lofstrand crutch  PLOF: Independent with basic ADLs, Independent with household mobility with device, Independent with community mobility with device, and Independent with transfers  PATIENT GOALS: improve walking and strength Rt leg  OBJECTIVE:   DIAGNOSTIC FINDINGS: IMPRESSION: 1. Interval anterior discectomy and fusion from C4 through C6 with improved patency of the spinal canal and only mild residual foraminal narrowing. 2. Stable mild spinal stenosis and mild to moderate left  foraminal narrowing at C3-4. 3. Stable mild spinal stenosis and mild foraminal narrowing bilaterally at C6-7. Mild  foraminal narrowing bilaterally at C7-T1. 4. No cord deformity or abnormal cord signal.  No acute findings. No recent MRI of lumbar region  COGNITION: Overall cognitive status: Within functional limits for tasks assessed   SENSATION: WFL  COORDINATION: Decreased RLE due to weakness and spasticity  POSTURE: No Significant postural limitations  LOWER EXTREMITY ROM:   LLE WFL's;  RLE WFL's except for ankle ROM which is minimal due to weakness; Pt has difficulty performing Rt knee extension due to Rt quad weakness and spasticity - turned body slightly to Lt to achieve more knee extension   LOWER EXTREMITY MMT:    MMT Right Eval Left Eval  Hip flexion 3- 3+   Hip extension    Hip abduction    Hip adduction    Hip internal rotation    Hip external rotation    Knee flexion 3+   Knee extension 4- 4  Ankle dorsiflexion 2-   Ankle plantarflexion    Ankle inversion    Ankle eversion    (Blank rows = not tested)  BED MOBILITY:  Modified independent  TRANSFERS: Assistive device utilized: Rollator  Sit to stand: Modified independence Stand to sit: Modified independence   STAIRS:  Not tested due to c/o back pain   GAIT: Gait pattern: decreased step length- Right, decreased stance time- Right, decreased hip/knee flexion- Right, and decreased ankle dorsiflexion- Right Distance walked: 50' Assistive device utilized: with rollator Level of assistance: SBA and CGA Comments: slow gait speed due to c/o back pain and RLE weakness  FUNCTIONAL TESTS:  Sit to stand score - 2 reps - with bil. UE support from chair- 29.91 secs             TUG score: - 49.19 secs with rollator  Gait velocity:  37.38 secs = .88 ft/sec with rollator  PATIENT SURVEYS:  N/A - based on diagnosis of RLE weakness due to chronic lumbar radiculopathy  TODAY'S TREATMENT: 03-22-24                                                                                                                            TherEx:  In seated position in chair - pt performed  RLE LAQ's with 2# weight - 10 reps  RLE step ups onto 4" step - 8 reps (due to fatigue with RLE weakness); then additional 5 reps after short seated rest period  LLE tap ups to 4" step 15 reps with bil. UE support - cues to keep Rt knee slightly flexed for increased knee control  Tap ups RLE onto 4" step 5 reps - stopped after 5 reps due to RLE weakness - pt needed seated rest period  Tap ups RLE onto 2" step 10 reps  Step down RLE 2" 10 reps with bil. UE support on // bars  Stepping forward/back with LLE 11 reps, ;  stepping out/in with LLE 10 reps - with bil. UE support on // bars with cues to keep Rt knee slightly flexed       Scifit level 2.5 - bil. UE and LE's x 5"; pt requested ice pack to back due to c/o burning sensation after completion of above exercises performed inside // bars     Gait: pt using rollator and wearing Blue rocker AFO on RLE - pt modified independent with use of rollator  Pt gait trained 115' x 1 rep with rollator with SBA (for STG assessment)  Pt performed step negotiation - 4 steps x 2 reps - using Lt rail with ascension and descension - with SBA     Gait velocity:   50.62 secs with rollator = .02 ft/sec with rollator (due to c/o back pain)           PATIENT EDUCATION: Education details:  reviewed previous HEP Person educated: Patient Education method: Explanation - handout declined as pt stated she has it from previous OP admission Education comprehension: verbalized understanding  HOME EXERCISE PROGRAM:   GOALS: Goals reviewed with patient? Yes  SHORT TERM GOALS: Target date:  03-15-24   Pt will increase gait velocity to >/= 1.2 ft/sec with use of rollator for incr. Gait efficiency. Baseline: 37.38 secs = .88 ft/sec with rollator   50.62 secs with rollator = .02 ft/sec with rollator (due to c/o back pain) Goal status: Not met 03-17-24  2.  Amb. 115' with RW with SBA without  requiring seated rest period. Baseline:  Goal status: Goal met 03-17-24  3.  Perform 5x sit to stand with bil. UE support with pt demonstrating equal weight bearing on each leg. Baseline: 2x with bil. UE support - due to c/o back pain Goal status: Goal met 03-17-24  4.  Improved TUG score to </= 35 secs with use of rollator for improved mobility and reduced fall risk. Baseline: 49.19 secs with rollator;  34.10 secs with rollator - 03-17-24 Goal status: Goal met 03-17-24  5.  Negotiate 4 steps with CGA with use of bil. Hand rails Baseline:  Goal status: Not tested on 03-17-24 per pt's request due to increased back pain/soreness  6.  Independent in HEP for RLE strengthening.  Baseline:  Goal status: Goal met 03-17-24    LONG TERM GOALS: Target date: 04-15-24  Amb. 230' with RW with SBA without requiring seated rest period. Baseline:  Goal status: INITIAL  2.  Improve TUG score to </= 28 secs  with use of RW for improved mobility and reduced fall risk. Baseline: 49.19 secs with rollator Goal status: INITIAL  3.  Perform 5x sit to stand transfers with LUE support only to demo improved strength in RLE.  Baseline:  Goal status: INITIAL  4.  Modified independent step negotiation with use of bil. Hand rails using step by step sequence. Baseline:  Goal status: INITIAL  5.  Increase gait velocity to >/= 1.6 ft/sec with rollator for increased gait efficiency with reduced fall risk.          Baseline: 37.38 secs = .88 ft/sec with rollator          Goal status:  INITIAL   6.  Independent in updated HEP for RLE strengthening. Baseline:  Goal status: INITIAL   7.  Report reduced low back pain at rest to </= 3/10 intensity.            Baseline:  7/10  Goal status:  INITIAL  ASSESSMENT:  CLINICAL IMPRESSION: PT session focused on RLE strengthening in open and closed chain positions and step negotiation for 1st time in this episode of care.  Pt ascends and descends steps with  use of Lt hand rail only and with turning sideways for both ascension and descension.  Pt able to perform step ups and tap ups with 4" step in today's session, increased step height from 2" step in previous session.  Pt continues to have decreased Rt quad muscle endurance and requires short seated rest breaks due to muscle fatigue.  Pt reported burning sensation in low back at end of closed chain exercises but this was relieved with use of ice pack on low back during SciFit exercise.  Cont with POC.    OBJECTIVE IMPAIRMENTS: Abnormal gait, decreased balance, decreased coordination, decreased endurance, decreased strength, and impaired tone.   ACTIVITY LIMITATIONS: carrying, bending, squatting, stairs, transfers, and locomotion level  PARTICIPATION LIMITATIONS: cleaning, laundry, shopping, and community activity  PERSONAL FACTORS: Behavior pattern, Past/current experiences, and Time since onset of injury/illness/exacerbation are also affecting patient's functional outcome.   REHAB POTENTIAL: Good  CLINICAL DECISION MAKING: Evolving/moderate complexity  EVALUATION COMPLEXITY: Moderate  PLAN:  PT FREQUENCY: 2x/week  PT DURATION: 8 weeks + eval   PLANNED INTERVENTIONS: Therapeutic exercises, Therapeutic activity, Neuromuscular re-education, Balance training, Gait training, Patient/Family education, Self Care, Stair training, Orthotic/Fit training, DME instructions, and Aquatic Therapy  PLAN FOR NEXT SESSION:   cont RLE strengthening; standing exercises 1st - then do LAQ's   Dorsie Gaunt, PT 03/23/2024, 7:05 PM

## 2024-03-23 ENCOUNTER — Encounter: Payer: Self-pay | Admitting: Physical Therapy

## 2024-03-23 ENCOUNTER — Other Ambulatory Visit (HOSPITAL_COMMUNITY): Payer: Self-pay

## 2024-03-23 MED ORDER — FEMRING 0.05 MG/24HR VA RING
VAGINAL_RING | VAGINAL | 4 refills | Status: AC
  Filled 2024-03-23: qty 1, 90d supply, fill #0
  Filled 2024-07-05: qty 1, 84d supply, fill #0
  Filled 2024-07-05: qty 1, 90d supply, fill #0
  Filled 2024-09-30: qty 1, 84d supply, fill #1

## 2024-03-24 ENCOUNTER — Other Ambulatory Visit: Payer: Self-pay | Admitting: Family Medicine

## 2024-03-24 ENCOUNTER — Ambulatory Visit: Payer: Self-pay | Admitting: Physical Therapy

## 2024-03-24 ENCOUNTER — Other Ambulatory Visit (HOSPITAL_COMMUNITY): Payer: Self-pay

## 2024-03-24 DIAGNOSIS — F419 Anxiety disorder, unspecified: Secondary | ICD-10-CM

## 2024-03-24 DIAGNOSIS — M6281 Muscle weakness (generalized): Secondary | ICD-10-CM | POA: Diagnosis not present

## 2024-03-24 DIAGNOSIS — R2689 Other abnormalities of gait and mobility: Secondary | ICD-10-CM

## 2024-03-24 NOTE — Therapy (Unsigned)
 OUTPATIENT PHYSICAL THERAPY NEURO TREATMENT NOTE   Patient Name: [name] MRN: [id number] DOB:August 28, 1961, 63 y.o., female Today's Date: 03/25/2024   PCP: [place], [name]., MD REFERRING PROVIDER: Rawland Caddy, MD  END OF SESSION:  PT End of Session - 03/25/24 1306     Visit Number 8    Number of Visits 17    Date for PT Re-Evaluation 04/15/24    Authorization Type [employer]    Authorization Time Period 02-16-24 -04-25-24    Progress Note Due on Visit 10    PT Start Time 0955    PT Stop Time 1036    PT Time Calculation (min) 41 min    Equipment Utilized During Treatment Gait belt    Activity Tolerance Other (comment);Patient tolerated treatment well   limited by c/o back pain   Behavior During Therapy [facility] for tasks assessed/performed                     Past Medical History:  Diagnosis Date   Anxiety    Aortic atherosclerosis (HCC) 04/16/2021   Atypical angina (HCC)    Back pain    related to spinal stenosis and disc problem, radiates down left buttocks to leg., weakness occ.   Chest pain    a. 03/2015 Cath: nl cors; b. 03/2021 Cor CTA: Ca2+ = 0. Nl Cors.   Chronic pain syndrome    Dyspnea    hx   GERD (gastroesophageal reflux disease)    Grade I diastolic dysfunction    Headache    Hyperlipidemia    Hypertension    Lumbar post-laminectomy syndrome    LVH (left ventricular hypertrophy) 12/15/2020   a. 11/2020 Echo: EF 65-70%, no rwma, sev asymm LVH with IVSd 1.9 cm. No LVOT obs @ rest. Gr1 DD. Triv MR.   PONV (postoperative nausea and vomiting)    Pulmonary nodules    a. 03/2021 CT Chest: 3mm pulm nodules in bilat lower lobes. F/u 1 yr.   Right foot drop    Syncope    a. 11/2020 Zio: No significant arrhythmias.   Vaginal foreign object    "Uses Femring "   Past Surgical History:  Procedure Laterality Date   ABDOMINAL HYSTERECTOMY     ANTERIOR CERVICAL DECOMP/DISCECTOMY FUSION N/A 01/03/2022   Procedure: Anterior Cervical  Decompression Fusion - Cervical four-Cervical five - Cervical five-Cervical six;  Surgeon: Isadora Mar, MD;  Location: [facility] OR;  Service: Neurosurgery;  Laterality: N/A;   BIOPSY  12/16/2020   Procedure: BIOPSY;  Surgeon: Asencion Blacksmith, MD;  Location: [facility] ENDOSCOPY;  Service: Endoscopy;;   CARDIAC CATHETERIZATION N/A 04/18/2015   Procedure: Left Heart Cath and Coronary Angiography;  Surgeon: Chapman Commodore, MD;  Location: [facility] INVASIVE CV LAB;  Service: Cardiovascular;  Laterality: N/A;   COLONOSCOPY     COLONOSCOPY W/ BIOPSIES AND POLYPECTOMY  2018   ESOPHAGOGASTRODUODENOSCOPY (EGD) WITH PROPOFOL  N/A 12/16/2020   Procedure: ESOPHAGOGASTRODUODENOSCOPY (EGD) WITH PROPOFOL ;  Surgeon: Asencion Blacksmith, MD;  Location: [facility] ENDOSCOPY;  Service: Endoscopy;  Laterality: N/A;   FOOT SURGERY Bilateral    [facility] "bunion,bone spur, tendon" (1) -6'16, (1)-10'16   HEMATOMA EVACUATION N/A 01/05/2022   Procedure: Cervical Wound Exploration;  Surgeon: Audie Bleacher, MD;  Location: [facility] OR;  Service: Neurosurgery;  Laterality: N/A;   IR EPIDUROGRAPHY  07/21/2018   LUMBAR LAMINECTOMY/DECOMPRESSION MICRODISCECTOMY Bilateral 12/28/2015   Procedure: MICRO LUMBAR DECOMPRESSION L4 - L5 BILATERALLY;  Surgeon: Orvan Blanch, MD;  Location: [facility] ORS;  Service: Orthopedics;  Laterality: Bilateral;   LUMBAR LAMINECTOMY/DECOMPRESSION MICRODISCECTOMY Bilateral 03/04/2018   Procedure: Revision of Microlumbar Decompression Bilateral Lumbar Four-Five;  Surgeon: Orvan Blanch, MD;  Location: [facility] OR;  Service: Orthopedics;  Laterality: Bilateral;  90 mins   LUMBAR WOUND DEBRIDEMENT N/A 12/25/2023   Procedure: Irrigation and debridement of lumbar wound;  Surgeon: Joaquin Mulberry, MD;  Location: [facility] OR;  Service: Neurosurgery;  Laterality: N/A;   POSTERIOR FUSION PEDICLE SCREW PLACEMENT N/A 12/07/2023   Procedure: LUMBAR FOUR-FIVE POSTERIOR LATERAL FUSION;  Surgeon: Joaquin Mulberry, MD;  Location: [facility] OR;  Service:  Neurosurgery;  Laterality: N/A;   SAVORY DILATION N/A 12/16/2020   Procedure: SAVORY DILATION;  Surgeon: Asencion Blacksmith, MD;  Location: [facility] ENDOSCOPY;  Service: Endoscopy;  Laterality: N/A;   SPINAL CORD STIMULATOR INSERTION N/A 09/28/2019   Procedure: THORACIC SPINAL CORD STIMULATOR INSERTION;  Surgeon: Mort Ards, MD;  Location: [facility] OR;  Service: Orthopedics;  Laterality: N/A;  2.5 hrs   SPINAL CORD STIMULATOR REMOVAL N/A 05/27/2021   Procedure: LUMBAR SPINAL CORD STIMULATOR REMOVAL;  Surgeon: Berta Brittle, MD;  Location: [facility] ORS;  Service: Neurosurgery;  Laterality: N/A;   TUBAL LIGATION     WISDOM TOOTH EXTRACTION     WOUND EXPLORATION N/A 03/04/2018   Procedure: EXPLORATION OF LUMBAR DECOMPRESSION WOUND;  Surgeon: Orvan Blanch, MD;  Location: [facility] OR;  Service: Orthopedics;  Laterality: N/A;   Patient Active Problem List   Diagnosis Date Noted   Seasonal allergic rhinitis due to pollen 01/29/2024   Delayed wound healing 12/25/2023   S/P lumbar fusion 12/07/2023   Pain in thumb joint with movement of right hand 08/12/2023   Localized primary osteoarthritis of carpometacarpal joint of right thumb 07/29/2023   Right foot drop 07/29/2023   Frequent falls 04/14/2023   Gastroesophageal reflux disease 04/14/2023   Right upper extremity numbness 09/29/2022   Stage 3a chronic kidney disease (HCC) 09/29/2022   Cervical spondylosis with radiculopathy 01/05/2022   S/P cervical spinal fusion 01/03/2022   Cervical disc disorder with radiculopathy of cervical region 11/27/2021   Uncontrolled pain 05/27/2021   Syncope 05/13/2021   Depression with anxiety 05/13/2021   Chronic diastolic CHF (congestive heart failure) (HCC) 05/13/2021   Chronic back pain 05/13/2021   Chest tightness 05/13/2021   Lower abdominal pain 05/13/2021   Syncope and collapse 01/01/2021   Abnormal echocardiogram 01/01/2021   BPPV (benign paroxysmal positional vertigo), left 12/26/2020   Odynophagia    Dysphagia  12/15/2020   Postural dizziness with presyncope 12/14/2020   Lumbar post-laminectomy syndrome    Chronic pain 09/28/2019   Lumbar spinal stenosis    Radicular pain    Gait disorder    Difficulty in urination 07/13/2018   Back pain 07/12/2018   Hyperlipidemia 04/16/2018   GAD (generalized anxiety disorder) 04/16/2018   AKI (acute kidney injury) (HCC)    Benign essential HTN    Major depression, recurrent (HCC)    Lumbar radiculopathy 03/09/2018   Paraparesis (HCC) 03/09/2018   Essential hypertension    Chronic nonintractable headache    Leukocytosis    Acute blood loss anemia    Postoperative pain    Generalized weakness    Spinal stenosis at L4-L5 level 03/04/2018   Spinal stenosis of lumbar region 12/28/2015   Chest pain 04/16/2015    ONSET DATE: Most recent Surgery date 12-07-23:  Referral date 02-03-24  REFERRING DIAG:  M54.16 (ICD-10-CM) - Lumbar radiculopathy M96.1 (ICD-10-CM) - Lumbar post-laminectomy syndrome R26.9 (ICD-10-CM) - Gait disorder  THERAPY DIAG:  Muscle weakness (generalized)  Other abnormalities of gait and mobility  Rationale for Evaluation and Treatment: Rehabilitation  SUBJECTIVE:                                                                                                                                                                                             SUBJECTIVE STATEMENT: Pt reports she still has some burning in her low back - is worse at night - feels it more when she lies on her Rt side. Doesn't really feel the burning in sitting.  Pt states the pain "isn't too bad today" (however, pain rating remains 5/10)   Pt accompanied by: self  PERTINENT HISTORY: The patient was admitted on 12/07/2023 &  underwent PLIF L4-5. Hospitalized 12-07-23 - 12-11-23:  The patient was admitted on 12/25/2023 and taken to the operating room where the patient underwent irrigation and debridement of lumbar wound with primary closure and placement of external wound  VAC- hospitalized 12-25-23 -  12-26-23  Frequent falls:  Spinal cord stimulator insertion on 09-28-19; stimulator removal on 05-27-21 with resultant LLE weakness;  decompression and laminectomy on 12/28/15 at L4-L5  with redo decompression surgery on May 9,2019, post-op paraparesis with surgery to remove small hematoma; 07/12/18 fall at work (involved rolling chair) with hospital admission, transfer to CIR with d/c home 08/03/18; HTN, Bil foot surgery; left ventricular hypertrophy, chronic pain syndrome, dyspnea, hypertrophic cardiomyopathy; cervical fusion March 2023;  had carpal tunnel surgery RUE in April 2024:  LVH  PAIN:  Are you having pain? Pt rates pain 5/10 in lumbar region - greater pain in lower lumbar region near sacral area Alleviating Factor:  placing pillow behind back when sitting Aggravating Factors: certain movements    PRECAUTIONS: Fall; No bending/lifting or twisting; pt states she has a reacher  WEIGHT BEARING RESTRICTIONS: No  FALLS: Has patient fallen in last 6 months? Yes. Number of falls 3  LIVING ENVIRONMENT: Lives with: lives with their spouse Lives in: House/apartment Stairs: Yes: External: 5 steps; on right going up Has following equipment at home: Single point cane, Wheelchair (manual), and L[place]strand crutch  PLOF: Independent with basic ADLs, Independent with household mobility with device, Independent with community mobility with device, and Independent with transfers  PATIENT GOALS: improve walking and strength Rt leg  OBJECTIVE:   DIAGNOSTIC FINDINGS: IMPRESSION: 1. Interval anterior discectomy and fusion from C4 through C6 with improved patency of the spinal canal and only mild residual foraminal narrowing. 2. Stable mild spinal stenosis and mild to moderate left foraminal narrowing at C3-4. 3. Stable mild spinal stenosis and mild foraminal narrowing bilaterally at  C6-7. Mild foraminal narrowing bilaterally at C7-T1. 4. No cord deformity or abnormal cord  signal.  No acute findings. No recent MRI of lumbar region  COGNITION: Overall cognitive status: Within functional limits for tasks assessed   SENSATION: WFL  COORDINATION: Decreased RLE due to weakness and spasticity  POSTURE: No Significant postural limitations  LOWER EXTREMITY ROM:   LLE WFL's;  RLE WFL's except for ankle ROM which is minimal due to weakness; Pt has difficulty performing Rt knee extension due to Rt quad weakness and spasticity - turned body slightly to Lt to achieve more knee extension   LOWER EXTREMITY MMT:    MMT Right Eval Left Eval  Hip flexion 3- 3+   Hip extension    Hip abduction    Hip adduction    Hip internal rotation    Hip external rotation    Knee flexion 3+   Knee extension 4- 4  Ankle dorsiflexion 2-   Ankle plantarflexion    Ankle inversion    Ankle eversion    (Blank rows = not tested)  BED MOBILITY:  Modified independent  TRANSFERS: Assistive device utilized: Rollator  Sit to stand: Modified independence Stand to sit: Modified independence   STAIRS:  Not tested due to c/o back pain   GAIT: Gait pattern: decreased step length- Right, decreased stance time- Right, decreased hip/knee flexion- Right, and decreased ankle dorsiflexion- Right Distance walked: 50' Assistive device utilized: with rollator Level of assistance: SBA and CGA Comments: slow gait speed due to c/o back pain and RLE weakness  FUNCTIONAL TESTS:  Sit to stand score - 2 reps - with bil. UE support from chair- 29.91 secs             TUG score: - 49.19 secs with rollator  Gait velocity:  37.38 secs = .88 ft/sec with rollator  PATIENT SURVEYS:  N/A - based on diagnosis of RLE weakness due to chronic lumbar radiculopathy   TODAY'S TREATMENT: 03-24-24                                                                                                                           TherAct:  Pt performed step negotiation - 4 steps x 2 reps - using Lt rail with  ascension and descension - with SBA (for functional strengthening)  RLE step ups onto 4" step - 14 reps  LLE tap ups to 4" step 15 reps with bil. UE support - cues to keep Rt knee slightly flexed for increased knee control  Tap ups RLE onto 4" step 5 reps - stopped after 5 reps due to RLE weakness - pt needed seated rest period - changed to 2" step for next exercise--  Tap ups RLE onto 2" step 15 reps  LLE tap ups to 2" step - 15 reps for RLE weight bearing - cues to keep Rt knee slightly flexed  Step down RLE from 2" step-  6 reps with bil. UE support on  bar at mirror with RW on Rt side for RUE support  TherEx:       Scifit level 2.5 - bil. UE and LE's x 10";  ice pack applied to low back due to c/o burning sensation after completion of above exercises performed at bar at mirrored wall     Gait:  Pt using rollator and wearing Blue rocker AFO on RLE - pt modified independent with use of rollator  Pt performed step negotiation - 4 steps x 2 reps - using Lt rail with ascension and descension - with SBA     Gait velocity:   50.62 secs with rollator = .02 ft/sec with rollator (due to c/o back pain)           PATIENT EDUCATION: Education details:  reviewed previous HEP Person educated: Patient Education method: Explanation - handout declined as pt stated she has it from previous OP admission Education comprehension: verbalized understanding  HOME EXERCISE PROGRAM:   GOALS: Goals reviewed with patient? Yes  SHORT TERM GOALS: Target date:  03-15-24   Pt will increase gait velocity to >/= 1.2 ft/sec with use of rollator for incr. Gait efficiency. Baseline: 37.38 secs = .88 ft/sec with rollator   50.62 secs with rollator = .02 ft/sec with rollator (due to c/o back pain) Goal status: Not met 03-17-24  2.  Amb. 115' with RW with SBA without requiring seated rest period. Baseline:  Goal status: Goal met 03-17-24  3.  Perform 5x sit to stand with bil. UE support with pt  demonstrating equal weight bearing on each leg. Baseline: 2x with bil. UE support - due to c/o back pain Goal status: Goal met 03-17-24  4.  Improved TUG score to </= 35 secs with use of rollator for improved mobility and reduced fall risk. Baseline: 49.19 secs with rollator;  34.10 secs with rollator - 03-17-24 Goal status: Goal met 03-17-24  5.  Negotiate 4 steps with CGA with use of bil. Hand rails Baseline:  Goal status: Not tested on 03-17-24 per pt's request due to increased back pain/soreness  6.  Independent in HEP for RLE strengthening.  Baseline:  Goal status: Goal met 03-17-24    LONG TERM GOALS: Target date: 04-15-24  Amb. 230' with RW with SBA without requiring seated rest period. Baseline:  Goal status: INITIAL  2.  Improve TUG score to </= 28 secs  with use of RW for improved mobility and reduced fall risk. Baseline: 49.19 secs with rollator Goal status: INITIAL  3.  Perform 5x sit to stand transfers with LUE support only to demo improved strength in RLE.  Baseline:  Goal status: INITIAL  4.  Modified independent step negotiation with use of bil. Hand rails using step by step sequence. Baseline:  Goal status: INITIAL  5.  Increase gait velocity to >/= 1.6 ft/sec with rollator for increased gait efficiency with reduced fall risk.          Baseline: 37.38 secs = .88 ft/sec with rollator          Goal status:  INITIAL   6.  Independent in updated HEP for RLE strengthening. Baseline:  Goal status: INITIAL   7.  Report reduced low back pain at rest to </= 3/10 intensity.            Baseline:  7/10            Goal status:  INITIAL  ASSESSMENT:  CLINICAL IMPRESSION: PT session focused on RLE strengthening in  closed chain position.  Exercises performed at bar at mirrored wall with RUE support on rollator, LUE support on bar (due to // bars being occupied).  Pt performed step ups 14 reps onto 4" step with RLE for quad strengthening and then reported increased  burning sensation in low back musculature, appearing to be due to increased pushing through LUE for assist with this step up exercise.  Seated rest period taken with some subsiding of pain reported.  Pt continues to have decreased strength and muscle endurance of Rt quads.  Cont with POC.    OBJECTIVE IMPAIRMENTS: Abnormal gait, decreased balance, decreased coordination, decreased endurance, decreased strength, and impaired tone.   ACTIVITY LIMITATIONS: carrying, bending, squatting, stairs, transfers, and locomotion level  PARTICIPATION LIMITATIONS: cleaning, laundry, shopping, and community activity  PERSONAL FACTORS: Behavior pattern, Past/current experiences, and Time since onset of injury/illness/exacerbation are also affecting patient's functional outcome.   REHAB POTENTIAL: Good  CLINICAL DECISION MAKING: Evolving/moderate complexity  EVALUATION COMPLEXITY: Moderate  PLAN:  PT FREQUENCY: 2x/week  PT DURATION: 8 weeks + eval   PLANNED INTERVENTIONS: Therapeutic exercises, Therapeutic activity, Neuromuscular re-education, Balance training, Gait training, Patient/Family education, Self Care, Stair training, Orthotic/Fit training, DME instructions, and Aquatic Therapy  PLAN FOR NEXT SESSION:   cont RLE strengthening; standing exercises 1st - then do LAQ's   Lidwina Kaner Suzanne, PT 03/25/2024, 1:22 PM

## 2024-03-25 ENCOUNTER — Other Ambulatory Visit: Payer: Self-pay

## 2024-03-25 ENCOUNTER — Encounter: Payer: Self-pay | Admitting: Physical Therapy

## 2024-03-25 ENCOUNTER — Other Ambulatory Visit (HOSPITAL_COMMUNITY): Payer: Self-pay

## 2024-03-25 MED ORDER — ALPRAZOLAM 0.5 MG PO TABS
0.2500 mg | ORAL_TABLET | Freq: Every evening | ORAL | 3 refills | Status: DC | PRN
  Filled 2024-03-25: qty 30, 30d supply, fill #0
  Filled 2024-05-27: qty 30, 30d supply, fill #1
  Filled 2024-07-11: qty 30, 30d supply, fill #2
  Filled 2024-08-30: qty 30, 30d supply, fill #3

## 2024-03-25 MED ORDER — BETAMETHASONE DIPROPIONATE 0.05 % EX CREA
TOPICAL_CREAM | CUTANEOUS | 0 refills | Status: DC
  Filled 2024-03-25: qty 45, 30d supply, fill #0

## 2024-03-26 ENCOUNTER — Other Ambulatory Visit (HOSPITAL_BASED_OUTPATIENT_CLINIC_OR_DEPARTMENT_OTHER): Payer: Self-pay

## 2024-03-29 ENCOUNTER — Ambulatory Visit: Attending: Family Medicine | Admitting: Physical Therapy

## 2024-03-29 DIAGNOSIS — R2681 Unsteadiness on feet: Secondary | ICD-10-CM | POA: Insufficient documentation

## 2024-03-29 DIAGNOSIS — M6281 Muscle weakness (generalized): Secondary | ICD-10-CM | POA: Diagnosis not present

## 2024-03-29 DIAGNOSIS — R2689 Other abnormalities of gait and mobility: Secondary | ICD-10-CM | POA: Diagnosis not present

## 2024-03-29 NOTE — Therapy (Unsigned)
 OUTPATIENT PHYSICAL THERAPY NEURO TREATMENT NOTE   Patient Name: [name] MRN: [id number] DOB:07/01/1961, 63 y.o., female Today's Date: 04/01/2024   PCP: [place], [name]., MD REFERRING PROVIDER: Rawland Caddy, MD  END OF SESSION:  PT End of Session - 04/01/24 1435     Visit Number 9    Number of Visits 17    Date for PT Re-Evaluation 04/15/24    Authorization Type [employer]    Authorization Time Period 02-16-24 -04-25-24    Progress Note Due on Visit 10    PT Start Time 1016    PT Stop Time 1100    PT Time Calculation (min) 44 min    Equipment Utilized During Treatment Gait belt    Activity Tolerance Patient tolerated treatment well    Behavior During Therapy WFL for tasks assessed/performed                      Past Medical History:  Diagnosis Date   Anxiety    Aortic atherosclerosis (HCC) 04/16/2021   Atypical angina (HCC)    Back pain    related to spinal stenosis and disc problem, radiates down left buttocks to leg., weakness occ.   Chest pain    a. 03/2015 Cath: nl cors; b. 03/2021 Cor CTA: Ca2+ = 0. Nl Cors.   Chronic pain syndrome    Dyspnea    hx   GERD (gastroesophageal reflux disease)    Grade I diastolic dysfunction    Headache    Hyperlipidemia    Hypertension    Lumbar post-laminectomy syndrome    LVH (left ventricular hypertrophy) 12/15/2020   a. 11/2020 Echo: EF 65-70%, no rwma, sev asymm LVH with IVSd 1.9 cm. No LVOT obs @ rest. Gr1 DD. Triv MR.   PONV (postoperative nausea and vomiting)    Pulmonary nodules    a. 03/2021 CT Chest: 3mm pulm nodules in bilat lower lobes. F/u 1 yr.   Right foot drop    Syncope    a. 11/2020 Zio: No significant arrhythmias.   Vaginal foreign object    "Uses Femring "   Past Surgical History:  Procedure Laterality Date   ABDOMINAL HYSTERECTOMY     ANTERIOR CERVICAL DECOMP/DISCECTOMY FUSION N/A 01/03/2022   Procedure: Anterior Cervical Decompression Fusion - Cervical four-Cervical  five - Cervical five-Cervical six;  Surgeon: Isadora Mar, MD;  Location: [facility] OR;  Service: Neurosurgery;  Laterality: N/A;   BIOPSY  12/16/2020   Procedure: BIOPSY;  Surgeon: Asencion Blacksmith, MD;  Location: [facility] ENDOSCOPY;  Service: Endoscopy;;   CARDIAC CATHETERIZATION N/A 04/18/2015   Procedure: Left Heart Cath and Coronary Angiography;  Surgeon: Chapman Commodore, MD;  Location: [facility] INVASIVE CV LAB;  Service: Cardiovascular;  Laterality: N/A;   COLONOSCOPY     COLONOSCOPY W/ BIOPSIES AND POLYPECTOMY  2018   ESOPHAGOGASTRODUODENOSCOPY (EGD) WITH PROPOFOL  N/A 12/16/2020   Procedure: ESOPHAGOGASTRODUODENOSCOPY (EGD) WITH PROPOFOL ;  Surgeon: Asencion Blacksmith, MD;  Location: [facility] ENDOSCOPY;  Service: Endoscopy;  Laterality: N/A;   FOOT SURGERY Bilateral    [facility] "bunion,bone spur, tendon" (1) -6'16, (1)-10'16   HEMATOMA EVACUATION N/A 01/05/2022   Procedure: Cervical Wound Exploration;  Surgeon: Audie Bleacher, MD;  Location: [facility] OR;  Service: Neurosurgery;  Laterality: N/A;   IR EPIDUROGRAPHY  07/21/2018   LUMBAR LAMINECTOMY/DECOMPRESSION MICRODISCECTOMY Bilateral 12/28/2015   Procedure: MICRO LUMBAR DECOMPRESSION L4 - L5 BILATERALLY;  Surgeon: Orvan Blanch, MD;  Location: [facility] ORS;  Service: Orthopedics;  Laterality: Bilateral;  LUMBAR LAMINECTOMY/DECOMPRESSION MICRODISCECTOMY Bilateral 03/04/2018   Procedure: Revision of Microlumbar Decompression Bilateral Lumbar Four-Five;  Surgeon: Orvan Blanch, MD;  Location: [facility] OR;  Service: Orthopedics;  Laterality: Bilateral;  90 mins   LUMBAR WOUND DEBRIDEMENT N/A 12/25/2023   Procedure: Irrigation and debridement of lumbar wound;  Surgeon: Joaquin Mulberry, MD;  Location: [facility] OR;  Service: Neurosurgery;  Laterality: N/A;   POSTERIOR FUSION PEDICLE SCREW PLACEMENT N/A 12/07/2023   Procedure: LUMBAR FOUR-FIVE POSTERIOR LATERAL FUSION;  Surgeon: Joaquin Mulberry, MD;  Location: [facility] OR;  Service: Neurosurgery;  Laterality: N/A;   SAVORY DILATION N/A  12/16/2020   Procedure: SAVORY DILATION;  Surgeon: Asencion Blacksmith, MD;  Location: [facility] ENDOSCOPY;  Service: Endoscopy;  Laterality: N/A;   SPINAL CORD STIMULATOR INSERTION N/A 09/28/2019   Procedure: THORACIC SPINAL CORD STIMULATOR INSERTION;  Surgeon: Mort Ards, MD;  Location: [facility] OR;  Service: Orthopedics;  Laterality: N/A;  2.5 hrs   SPINAL CORD STIMULATOR REMOVAL N/A 05/27/2021   Procedure: LUMBAR SPINAL CORD STIMULATOR REMOVAL;  Surgeon: Berta Brittle, MD;  Location: [facility] ORS;  Service: Neurosurgery;  Laterality: N/A;   TUBAL LIGATION     WISDOM TOOTH EXTRACTION     WOUND EXPLORATION N/A 03/04/2018   Procedure: EXPLORATION OF LUMBAR DECOMPRESSION WOUND;  Surgeon: Orvan Blanch, MD;  Location: [facility] OR;  Service: Orthopedics;  Laterality: N/A;   Patient Active Problem List   Diagnosis Date Noted   Seasonal allergic rhinitis due to pollen 01/29/2024   Delayed wound healing 12/25/2023   S/P lumbar fusion 12/07/2023   Pain in thumb joint with movement of right hand 08/12/2023   Localized primary osteoarthritis of carpometacarpal joint of right thumb 07/29/2023   Right foot drop 07/29/2023   Frequent falls 04/14/2023   Gastroesophageal reflux disease 04/14/2023   Right upper extremity numbness 09/29/2022   Stage 3a chronic kidney disease (HCC) 09/29/2022   Cervical spondylosis with radiculopathy 01/05/2022   S/P cervical spinal fusion 01/03/2022   Cervical disc disorder with radiculopathy of cervical region 11/27/2021   Uncontrolled pain 05/27/2021   Syncope 05/13/2021   Depression with anxiety 05/13/2021   Chronic diastolic CHF (congestive heart failure) (HCC) 05/13/2021   Chronic back pain 05/13/2021   Chest tightness 05/13/2021   Lower abdominal pain 05/13/2021   Syncope and collapse 01/01/2021   Abnormal echocardiogram 01/01/2021   BPPV (benign paroxysmal positional vertigo), left 12/26/2020   Odynophagia    Dysphagia 12/15/2020   Postural dizziness with presyncope  12/14/2020   Lumbar post-laminectomy syndrome    Chronic pain 09/28/2019   Lumbar spinal stenosis    Radicular pain    Gait disorder    Difficulty in urination 07/13/2018   Back pain 07/12/2018   Hyperlipidemia 04/16/2018   GAD (generalized anxiety disorder) 04/16/2018   AKI (acute kidney injury) (HCC)    Benign essential HTN    Major depression, recurrent (HCC)    Lumbar radiculopathy 03/09/2018   Paraparesis (HCC) 03/09/2018   Essential hypertension    Chronic nonintractable headache    Leukocytosis    Acute blood loss anemia    Postoperative pain    Generalized weakness    Spinal stenosis at L4-L5 level 03/04/2018   Spinal stenosis of lumbar region 12/28/2015   Chest pain 04/16/2015    ONSET DATE: Most recent Surgery date 12-07-23:  Referral date 02-03-24  REFERRING DIAG:  M54.16 (ICD-10-CM) - Lumbar radiculopathy M96.1 (ICD-10-CM) - Lumbar post-laminectomy syndrome R26.9 (ICD-10-CM) - Gait disorder  THERAPY DIAG:  Muscle weakness (generalized)  Other abnormalities of gait and mobility  Rationale for Evaluation and Treatment: Rehabilitation  SUBJECTIVE:                                                                                                                                                                                             SUBJECTIVE STATEMENT: Pt reports she is doing OK - stayed in bed a lot on Sunday because she did something that she should not have (will not report exactly what she did) and states it strained her back a little - is feeling better today Pt accompanied by: self  PERTINENT HISTORY: The patient was admitted on 12/07/2023 &  underwent PLIF L4-5. Hospitalized 12-07-23 - 12-11-23:  The patient was admitted on 12/25/2023 and taken to the operating room where the patient underwent irrigation and debridement of lumbar wound with primary closure and placement of external wound VAC- hospitalized 12-25-23 -  12-26-23  Frequent falls:  Spinal cord  stimulator insertion on 09-28-19; stimulator removal on 05-27-21 with resultant LLE weakness;  decompression and laminectomy on 12/28/15 at L4-L5  with redo decompression surgery on May 9,2019, post-op paraparesis with surgery to remove small hematoma; 07/12/18 fall at work (involved rolling chair) with hospital admission, transfer to CIR with d/c home 08/03/18; HTN, Bil foot surgery; left ventricular hypertrophy, chronic pain syndrome, dyspnea, hypertrophic cardiomyopathy; cervical fusion March 2023;  had carpal tunnel surgery RUE in April 2024:  LVH  PAIN:  Are you having pain? Pt rates pain 5/10 in lumbar region - greater pain in lower lumbar region near sacral area Alleviating Factor:  placing pillow behind back when sitting Aggravating Factors: certain movements    PRECAUTIONS: Fall; No bending/lifting or twisting; pt states she has a reacher  WEIGHT BEARING RESTRICTIONS: No  FALLS: Has patient fallen in last 6 months? Yes. Number of falls 3  LIVING ENVIRONMENT: Lives with: lives with their spouse Lives in: House/apartment Stairs: Yes: External: 5 steps; on right going up Has following equipment at home: Single point cane, Wheelchair (manual), and Lofstrand crutch  PLOF: Independent with basic ADLs, Independent with household mobility with device, Independent with community mobility with device, and Independent with transfers  PATIENT GOALS: improve walking and strength Rt leg  OBJECTIVE:   DIAGNOSTIC FINDINGS: IMPRESSION: 1. Interval anterior discectomy and fusion from C4 through C6 with improved patency of the spinal canal and only mild residual foraminal narrowing. 2. Stable mild spinal stenosis and mild to moderate left foraminal narrowing at C3-4. 3. Stable mild spinal stenosis and mild foraminal narrowing bilaterally at C6-7. Mild foraminal narrowing bilaterally at C7-T1. 4. No cord deformity or abnormal cord  signal.  No acute findings. No recent MRI of lumbar  region  COGNITION: Overall cognitive status: Within functional limits for tasks assessed   SENSATION: WFL  COORDINATION: Decreased RLE due to weakness and spasticity  POSTURE: No Significant postural limitations  LOWER EXTREMITY ROM:   LLE WFL's;  RLE WFL's except for ankle ROM which is minimal due to weakness; Pt has difficulty performing Rt knee extension due to Rt quad weakness and spasticity - turned body slightly to Lt to achieve more knee extension   LOWER EXTREMITY MMT:    MMT Right Eval Left Eval  Hip flexion 3- 3+   Hip extension    Hip abduction    Hip adduction    Hip internal rotation    Hip external rotation    Knee flexion 3+   Knee extension 4- 4  Ankle dorsiflexion 2-   Ankle plantarflexion    Ankle inversion    Ankle eversion    (Blank rows = not tested)  BED MOBILITY:  Modified independent  TRANSFERS: Assistive device utilized: Rollator  Sit to stand: Modified independence Stand to sit: Modified independence   STAIRS:  Not tested due to c/o back pain   GAIT: Gait pattern: decreased step length- Right, decreased stance time- Right, decreased hip/knee flexion- Right, and decreased ankle dorsiflexion- Right Distance walked: 50' Assistive device utilized: with rollator Level of assistance: SBA and CGA Comments: slow gait speed due to c/o back pain and RLE weakness  FUNCTIONAL TESTS:  Sit to stand score - 2 reps - with bil. UE support from chair- 29.91 secs             TUG score: - 49.19 secs with rollator  Gait velocity:  37.38 secs = .88 ft/sec with rollator  PATIENT SURVEYS:  N/A - based on diagnosis of RLE weakness due to chronic lumbar radiculopathy   TODAY'S TREATMENT:  03-29-24                                                                                                                           TherAct:  Pt performed step negotiation - 4 steps x 3 reps - using Lt rail with ascension and descension - with SBA (for functional  strengthening)  RLE step ups onto 4" step - 10 reps; 2 sets  LLE tap ups to 4" step 15 reps with bil. UE support - cues to keep Rt knee slightly flexed for increased knee control  Tap ups RLE onto 4" step 6 reps - stopped after 6 reps due to RLE weakness - pt needed seated rest period - changed to 2" step for next exercise--  Tap ups RLE onto 3" step 10 reps with bil. UE support; 5 reps LUE support only on // bar  Step down RLE from 2" step-  15 reps with bil. UE support on // bars  1/2 march - lifting LLE - 2 finger support only on bil. Hands 5 reps; 5 reps bil.  UE support  Stepping forward, backward and sideways with LLE - 10 reps each direction -- keeping Rt knee slightly flexed   TherEx:       Scifit level 2.5 - bil. UE and LE's x 10";  ice pack applied to low back due to c/o burning sensation after completion of above exercises performed at bar at mirrored wall  Pt performed step negotiation - 4 steps x 3 reps - using Lt rail with ascension and descension - with SBA (for functional strengthening)    Gait:  Pt using rollator and wearing Blue rocker AFO on RLE - pt modified independent with use of rollator      Gait velocity:   50.62 secs with rollator = .02 ft/sec with rollator (due to c/o back pain)           PATIENT EDUCATION: Education details:  reviewed previous HEP Person educated: Patient Education method: Explanation - handout declined as pt stated she has it from previous OP admission Education comprehension: verbalized understanding  HOME EXERCISE PROGRAM:   GOALS: Goals reviewed with patient? Yes  SHORT TERM GOALS: Target date:  03-15-24   Pt will increase gait velocity to >/= 1.2 ft/sec with use of rollator for incr. Gait efficiency. Baseline: 37.38 secs = .88 ft/sec with rollator   50.62 secs with rollator = .02 ft/sec with rollator (due to c/o back pain) Goal status: Not met 03-17-24  2.  Amb. 115' with RW with SBA without requiring seated  rest period. Baseline:  Goal status: Goal met 03-17-24  3.  Perform 5x sit to stand with bil. UE support with pt demonstrating equal weight bearing on each leg. Baseline: 2x with bil. UE support - due to c/o back pain Goal status: Goal met 03-17-24  4.  Improved TUG score to </= 35 secs with use of rollator for improved mobility and reduced fall risk. Baseline: 49.19 secs with rollator;  34.10 secs with rollator - 03-17-24 Goal status: Goal met 03-17-24  5.  Negotiate 4 steps with CGA with use of bil. Hand rails Baseline:  Goal status: Not tested on 03-17-24 per pt's request due to increased back pain/soreness  6.  Independent in HEP for RLE strengthening.  Baseline:  Goal status: Goal met 03-17-24    LONG TERM GOALS: Target date: 04-15-24  Amb. 230' with RW with SBA without requiring seated rest period. Baseline:  Goal status: INITIAL  2.  Improve TUG score to </= 28 secs  with use of RW for improved mobility and reduced fall risk. Baseline: 49.19 secs with rollator Goal status: INITIAL  3.  Perform 5x sit to stand transfers with LUE support only to demo improved strength in RLE.  Baseline:  Goal status: INITIAL  4.  Modified independent step negotiation with use of bil. Hand rails using step by step sequence. Baseline:  Goal status: INITIAL  5.  Increase gait velocity to >/= 1.6 ft/sec with rollator for increased gait efficiency with reduced fall risk.          Baseline: 37.38 secs = .88 ft/sec with rollator          Goal status:  INITIAL   6.  Independent in updated HEP for RLE strengthening. Baseline:  Goal status: INITIAL   7.  Report reduced low back pain at rest to </= 3/10 intensity.            Baseline:  7/10            Goal status:  INITIAL  ASSESSMENT:  CLINICAL IMPRESSION: PT session focused on RLE strengthening in closed chain position.  Pt able to increase reps of step negotiation to 3 consecutive reps in today's session.  Pt continues to fatigue with  step up exercise with RLE due to Rt quad weakness & decreased muscle endurance.  Cont with POC.    OBJECTIVE IMPAIRMENTS: Abnormal gait, decreased balance, decreased coordination, decreased endurance, decreased strength, and impaired tone.   ACTIVITY LIMITATIONS: carrying, bending, squatting, stairs, transfers, and locomotion level  PARTICIPATION LIMITATIONS: cleaning, laundry, shopping, and community activity  PERSONAL FACTORS: Behavior pattern, Past/current experiences, and Time since onset of injury/illness/exacerbation are also affecting patient's functional outcome.   REHAB POTENTIAL: Good  CLINICAL DECISION MAKING: Evolving/moderate complexity  EVALUATION COMPLEXITY: Moderate  PLAN:  PT FREQUENCY: 2x/week  PT DURATION: 8 weeks + eval   PLANNED INTERVENTIONS: Therapeutic exercises, Therapeutic activity, Neuromuscular re-education, Balance training, Gait training, Patient/Family education, Self Care, Stair training, Orthotic/Fit training, DME instructions, and Aquatic Therapy  PLAN FOR NEXT SESSION:   cont RLE strengthening; Rt quad strengthening   Murrell Elizondo, Celeste Cola, PT 04/01/2024, 2:38 PM

## 2024-03-31 ENCOUNTER — Ambulatory Visit: Admitting: Physical Therapy

## 2024-03-31 DIAGNOSIS — M6281 Muscle weakness (generalized): Secondary | ICD-10-CM | POA: Diagnosis not present

## 2024-03-31 DIAGNOSIS — R2689 Other abnormalities of gait and mobility: Secondary | ICD-10-CM

## 2024-03-31 NOTE — Therapy (Unsigned)
 OUTPATIENT PHYSICAL THERAPY NEURO TREATMENT NOTE/10th VISIT PROGRESS NOTE     Progress Note  Reporting Period 02-16-24 to 03-31-24  See note below for Objective Data and Assessment of Progress/Goals.  Thank you for the referral of this patient.     Patient Name: [name] MRN: [id number] DOB:06-18-1961, 63 y.o., female Today's Date: 04/01/2024   PCP: [place], [name]., MD REFERRING PROVIDER: Rawland Caddy, MD  END OF SESSION:  PT End of Session - 04/01/24 1657     Visit Number 10    Number of Visits 17    Date for PT Re-Evaluation 04/15/24    Authorization Type [employer]    Authorization Time Period 02-16-24 -04-25-24    Progress Note Due on Visit 10    PT Start Time 1014    PT Stop Time 1101    PT Time Calculation (min) 47 min    Equipment Utilized During Treatment Gait belt    Activity Tolerance Patient tolerated treatment well    Behavior During Therapy [facility] for tasks assessed/performed                      Past Medical History:  Diagnosis Date   Anxiety    Aortic atherosclerosis (HCC) 04/16/2021   Atypical angina (HCC)    Back pain    related to spinal stenosis and disc problem, radiates down left buttocks to leg., weakness occ.   Chest pain    a. 03/2015 Cath: nl cors; b. 03/2021 Cor CTA: Ca2+ = 0. Nl Cors.   Chronic pain syndrome    Dyspnea    hx   GERD (gastroesophageal reflux disease)    Grade I diastolic dysfunction    Headache    Hyperlipidemia    Hypertension    Lumbar post-laminectomy syndrome    LVH (left ventricular hypertrophy) 12/15/2020   a. 11/2020 Echo: EF 65-70%, no rwma, sev asymm LVH with IVSd 1.9 cm. No LVOT obs @ rest. Gr1 DD. Triv MR.   PONV (postoperative nausea and vomiting)    Pulmonary nodules    a. 03/2021 CT Chest: 3mm pulm nodules in bilat lower lobes. F/u 1 yr.   Right foot drop    Syncope    a. 11/2020 Zio: No significant arrhythmias.   Vaginal foreign object    "Uses Femring "   Past Surgical  History:  Procedure Laterality Date   ABDOMINAL HYSTERECTOMY     ANTERIOR CERVICAL DECOMP/DISCECTOMY FUSION N/A 01/03/2022   Procedure: Anterior Cervical Decompression Fusion - Cervical four-Cervical five - Cervical five-Cervical six;  Surgeon: Isadora Mar, MD;  Location: [facility] OR;  Service: Neurosurgery;  Laterality: N/A;   BIOPSY  12/16/2020   Procedure: BIOPSY;  Surgeon: Asencion Blacksmith, MD;  Location: [facility] ENDOSCOPY;  Service: Endoscopy;;   CARDIAC CATHETERIZATION N/A 04/18/2015   Procedure: Left Heart Cath and Coronary Angiography;  Surgeon: Chapman Commodore, MD;  Location: [facility] INVASIVE CV LAB;  Service: Cardiovascular;  Laterality: N/A;   COLONOSCOPY     COLONOSCOPY W/ BIOPSIES AND POLYPECTOMY  2018   ESOPHAGOGASTRODUODENOSCOPY (EGD) WITH PROPOFOL  N/A 12/16/2020   Procedure: ESOPHAGOGASTRODUODENOSCOPY (EGD) WITH PROPOFOL ;  Surgeon: Asencion Blacksmith, MD;  Location: [facility] ENDOSCOPY;  Service: Endoscopy;  Laterality: N/A;   FOOT SURGERY Bilateral    [facility] "bunion,bone spur, tendon" (1) -6'16, (1)-10'16   HEMATOMA EVACUATION N/A 01/05/2022   Procedure: Cervical Wound Exploration;  Surgeon: Audie Bleacher, MD;  Location: [facility] OR;  Service: Neurosurgery;  Laterality: N/A;  IR EPIDUROGRAPHY  07/21/2018   LUMBAR LAMINECTOMY/DECOMPRESSION MICRODISCECTOMY Bilateral 12/28/2015   Procedure: MICRO LUMBAR DECOMPRESSION L4 - L5 BILATERALLY;  Surgeon: Orvan Blanch, MD;  Location: [facility] ORS;  Service: Orthopedics;  Laterality: Bilateral;   LUMBAR LAMINECTOMY/DECOMPRESSION MICRODISCECTOMY Bilateral 03/04/2018   Procedure: Revision of Microlumbar Decompression Bilateral Lumbar Four-Five;  Surgeon: Orvan Blanch, MD;  Location: [facility] OR;  Service: Orthopedics;  Laterality: Bilateral;  90 mins   LUMBAR WOUND DEBRIDEMENT N/A 12/25/2023   Procedure: Irrigation and debridement of lumbar wound;  Surgeon: Joaquin Mulberry, MD;  Location: [facility] OR;  Service: Neurosurgery;  Laterality: N/A;   POSTERIOR FUSION PEDICLE  SCREW PLACEMENT N/A 12/07/2023   Procedure: LUMBAR FOUR-FIVE POSTERIOR LATERAL FUSION;  Surgeon: Joaquin Mulberry, MD;  Location: [facility] OR;  Service: Neurosurgery;  Laterality: N/A;   SAVORY DILATION N/A 12/16/2020   Procedure: SAVORY DILATION;  Surgeon: Asencion Blacksmith, MD;  Location: [facility] ENDOSCOPY;  Service: Endoscopy;  Laterality: N/A;   SPINAL CORD STIMULATOR INSERTION N/A 09/28/2019   Procedure: THORACIC SPINAL CORD STIMULATOR INSERTION;  Surgeon: Mort Ards, MD;  Location: [facility] OR;  Service: Orthopedics;  Laterality: N/A;  2.5 hrs   SPINAL CORD STIMULATOR REMOVAL N/A 05/27/2021   Procedure: LUMBAR SPINAL CORD STIMULATOR REMOVAL;  Surgeon: Berta Brittle, MD;  Location: [facility] ORS;  Service: Neurosurgery;  Laterality: N/A;   TUBAL LIGATION     WISDOM TOOTH EXTRACTION     WOUND EXPLORATION N/A 03/04/2018   Procedure: EXPLORATION OF LUMBAR DECOMPRESSION WOUND;  Surgeon: Orvan Blanch, MD;  Location: [facility] OR;  Service: Orthopedics;  Laterality: N/A;   Patient Active Problem List   Diagnosis Date Noted   Seasonal allergic rhinitis due to pollen 01/29/2024   Delayed wound healing 12/25/2023   S/P lumbar fusion 12/07/2023   Pain in thumb joint with movement of right hand 08/12/2023   Localized primary osteoarthritis of carpometacarpal joint of right thumb 07/29/2023   Right foot drop 07/29/2023   Frequent falls 04/14/2023   Gastroesophageal reflux disease 04/14/2023   Right upper extremity numbness 09/29/2022   Stage 3a chronic kidney disease (HCC) 09/29/2022   Cervical spondylosis with radiculopathy 01/05/2022   S/P cervical spinal fusion 01/03/2022   Cervical disc disorder with radiculopathy of cervical region 11/27/2021   Uncontrolled pain 05/27/2021   Syncope 05/13/2021   Depression with anxiety 05/13/2021   Chronic diastolic CHF (congestive heart failure) (HCC) 05/13/2021   Chronic back pain 05/13/2021   Chest tightness 05/13/2021   Lower abdominal pain 05/13/2021   Syncope and  collapse 01/01/2021   Abnormal echocardiogram 01/01/2021   BPPV (benign paroxysmal positional vertigo), left 12/26/2020   Odynophagia    Dysphagia 12/15/2020   Postural dizziness with presyncope 12/14/2020   Lumbar post-laminectomy syndrome    Chronic pain 09/28/2019   Lumbar spinal stenosis    Radicular pain    Gait disorder    Difficulty in urination 07/13/2018   Back pain 07/12/2018   Hyperlipidemia 04/16/2018   GAD (generalized anxiety disorder) 04/16/2018   AKI (acute kidney injury) (HCC)    Benign essential HTN    Major depression, recurrent (HCC)    Lumbar radiculopathy 03/09/2018   Paraparesis (HCC) 03/09/2018   Essential hypertension    Chronic nonintractable headache    Leukocytosis    Acute blood loss anemia    Postoperative pain    Generalized weakness    Spinal stenosis at L4-L5 level 03/04/2018   Spinal stenosis of lumbar region 12/28/2015   Chest pain 04/16/2015    ONSET  DATE: Most recent Surgery date 12-07-23:  Referral date 02-03-24  REFERRING DIAG:  M54.16 (ICD-10-CM) - Lumbar radiculopathy M96.1 (ICD-10-CM) - Lumbar post-laminectomy syndrome R26.9 (ICD-10-CM) - Gait disorder  THERAPY DIAG:  Other abnormalities of gait and mobility  Muscle weakness (generalized)  Rationale for Evaluation and Treatment: Rehabilitation  SUBJECTIVE:                                                                                                                                                                                             SUBJECTIVE STATEMENT: Pt reports she is doing fine - was tired after session on Tuesday but went home and rested. Denies having the burning sensation today. Pain 5/10 "more soreness than true pain" Pt accompanied by: self  PERTINENT HISTORY: The patient was admitted on 12/07/2023 &  underwent PLIF L4-5. Hospitalized 12-07-23 - 12-11-23:  The patient was admitted on 12/25/2023 and taken to the operating room where the patient underwent irrigation  and debridement of lumbar wound with primary closure and placement of external wound VAC- hospitalized 12-25-23 -  12-26-23  Frequent falls:  Spinal cord stimulator insertion on 09-28-19; stimulator removal on 05-27-21 with resultant LLE weakness;  decompression and laminectomy on 12/28/15 at L4-L5  with redo decompression surgery on May 9,2019, post-op paraparesis with surgery to remove small hematoma; 07/12/18 fall at work (involved rolling chair) with hospital admission, transfer to CIR with d/c home 08/03/18; HTN, Bil foot surgery; left ventricular hypertrophy, chronic pain syndrome, dyspnea, hypertrophic cardiomyopathy; cervical fusion March 2023;  had carpal tunnel surgery RUE in April 2024:  LVH  PAIN:  Are you having pain? Pt rates pain 5/10 in lumbar region - greater pain in lower lumbar region near sacral area Alleviating Factor:  placing pillow behind back when sitting Aggravating Factors: certain movements    PRECAUTIONS: Fall; No bending/lifting or twisting; pt states she has a reacher  WEIGHT BEARING RESTRICTIONS: No  FALLS: Has patient fallen in last 6 months? Yes. Number of falls 3  LIVING ENVIRONMENT: Lives with: lives with their spouse Lives in: House/apartment Stairs: Yes: External: 5 steps; on right going up Has following equipment at home: Single point cane, Wheelchair (manual), and L[place]strand crutch  PLOF: Independent with basic ADLs, Independent with household mobility with device, Independent with community mobility with device, and Independent with transfers  PATIENT GOALS: improve walking and strength Rt leg  OBJECTIVE:   DIAGNOSTIC FINDINGS: IMPRESSION: 1. Interval anterior discectomy and fusion from C4 through C6 with improved patency of the spinal canal and only mild residual foraminal narrowing. 2. Stable mild spinal stenosis and mild to moderate left foraminal narrowing at  C3-4. 3. Stable mild spinal stenosis and mild foraminal narrowing bilaterally at C6-7.  Mild foraminal narrowing bilaterally at C7-T1. 4. No cord deformity or abnormal cord signal.  No acute findings. No recent MRI of lumbar region  COGNITION: Overall cognitive status: Within functional limits for tasks assessed   SENSATION: WFL  COORDINATION: Decreased RLE due to weakness and spasticity  POSTURE: No Significant postural limitations  LOWER EXTREMITY ROM:   LLE WFL's;  RLE WFL's except for ankle ROM which is minimal due to weakness; Pt has difficulty performing Rt knee extension due to Rt quad weakness and spasticity - turned body slightly to Lt to achieve more knee extension   LOWER EXTREMITY MMT:    MMT Right Eval Left Eval  Hip flexion 3- 3+   Hip extension    Hip abduction    Hip adduction    Hip internal rotation    Hip external rotation    Knee flexion 3+   Knee extension 4- 4  Ankle dorsiflexion 2-   Ankle plantarflexion    Ankle inversion    Ankle eversion    (Blank rows = not tested)  BED MOBILITY:  Modified independent  TRANSFERS: Assistive device utilized: Rollator  Sit to stand: Modified independence Stand to sit: Modified independence   STAIRS:  Not tested due to c/o back pain   GAIT: Gait pattern: decreased step length- Right, decreased stance time- Right, decreased hip/knee flexion- Right, and decreased ankle dorsiflexion- Right Distance walked: 50' Assistive device utilized: with rollator Level of assistance: SBA and CGA Comments: slow gait speed due to c/o back pain and RLE weakness  FUNCTIONAL TESTS:  Sit to stand score - 2 reps - with bil. UE support from chair- 29.91 secs             TUG score: - 49.19 secs with rollator  Gait velocity:  37.38 secs = .88 ft/sec with rollator  PATIENT SURVEYS:  N/A - based on diagnosis of RLE weakness due to chronic lumbar radiculopathy   TODAY'S TREATMENT: 03-31-24                                                                                                                            TherAct:  Pt performed step negotiation - 4 steps x 3 reps - using Lt rail with ascension and descension - with SBA (for functional strengthening) Tap ups RLE onto 4" step 10 reps x 2 sets  RLE step ups onto 4" step - 10 reps, then 8 reps on 2nd set due to RLE fatigue and tremoring due to weakness  LLE tap ups to 4" step 15 reps with bil. UE support , then 5 reps on 2nd set - cues to keep Rt knee slightly flexed for increased knee control  Step down RLE from 2" step-  15 reps with bil. UE support on // bars  Tap ups RLE onto 3" step 10 reps with bil. UE support; 5 reps LUE  support only on // bar  Squats x 10 reps with bil. UE support on // bars  1/2 march - lifting LLE - 10 reps with bil. UE support on // bars     TherEx:       Scifit level 2.5 - bil. UE and LE's x 10";  ice pack applied to low back due to c/o burning sensation after completion of above exercises performed at bar at mirrored wall   Pt using rollator and wearing Blue rocker AFO on RLE - pt modified independent with use of rollator              PATIENT EDUCATION: Education details:  reviewed previous HEP Person educated: Patient Education method: Explanation - handout declined as pt stated she has it from previous OP admission Education comprehension: verbalized understanding  HOME EXERCISE PROGRAM:   GOALS: Goals reviewed with patient? Yes  SHORT TERM GOALS: Target date:  03-15-24   Pt will increase gait velocity to >/= 1.2 ft/sec with use of rollator for incr. Gait efficiency. Baseline: 37.38 secs = .88 ft/sec with rollator   50.62 secs with rollator = .02 ft/sec with rollator (due to c/o back pain) Goal status: Not met 03-17-24  2.  Amb. 115' with RW with SBA without requiring seated rest period. Baseline:  Goal status: Goal met 03-17-24  3.  Perform 5x sit to stand with bil. UE support with pt demonstrating equal weight bearing on each leg. Baseline: 2x with bil. UE support - due to  c/o back pain Goal status: Goal met 03-17-24  4.  Improved TUG score to </= 35 secs with use of rollator for improved mobility and reduced fall risk. Baseline: 49.19 secs with rollator;  34.10 secs with rollator - 03-17-24 Goal status: Goal met 03-17-24  5.  Negotiate 4 steps with CGA with use of bil. Hand rails Baseline:  Goal status: Not tested on 03-17-24 per pt's request due to increased back pain/soreness  6.  Independent in HEP for RLE strengthening.  Baseline:  Goal status: Goal met 03-17-24    LONG TERM GOALS: Target date: 04-15-24  Amb. 230' with RW with SBA without requiring seated rest period. Baseline:  Goal status: INITIAL  2.  Improve TUG score to </= 28 secs  with use of RW for improved mobility and reduced fall risk. Baseline: 49.19 secs with rollator Goal status: INITIAL  3.  Perform 5x sit to stand transfers with LUE support only to demo improved strength in RLE.  Baseline:  Goal status: INITIAL  4.  Modified independent step negotiation with use of bil. Hand rails using step by step sequence. Baseline:  Goal status: INITIAL  5.  Increase gait velocity to >/= 1.6 ft/sec with rollator for increased gait efficiency with reduced fall risk.          Baseline: 37.38 secs = .88 ft/sec with rollator          Goal status:  INITIAL   6.  Independent in updated HEP for RLE strengthening. Baseline:  Goal status: INITIAL   7.  Report reduced low back pain at rest to </= 3/10 intensity.            Baseline:  7/10            Goal status:  INITIAL  ASSESSMENT:  CLINICAL IMPRESSION: This 10th visit progress note covers dates 02-16-24 - 03-31-24.  Pt has met STG's #2-4 & #6 as assessed on 03-17-24:  STG #1 and #5  have been met as of today's date as pt is having less back pain at current time than she was experiencing on 03-17-24.  Pt continues to amb. With use of rollator modified independently.  Pt continues to have decreased strength and decreased muscle endurance of Rt  quads and Rt hip and knee flexors.  Pt rates back pain (soreness) at intensity 5/10.  Cont with POC.    OBJECTIVE IMPAIRMENTS: Abnormal gait, decreased balance, decreased coordination, decreased endurance, decreased strength, and impaired tone.   ACTIVITY LIMITATIONS: carrying, bending, squatting, stairs, transfers, and locomotion level  PARTICIPATION LIMITATIONS: cleaning, laundry, shopping, and community activity  PERSONAL FACTORS: Behavior pattern, Past/current experiences, and Time since onset of injury/illness/exacerbation are also affecting patient's functional outcome.   REHAB POTENTIAL: Good  CLINICAL DECISION MAKING: Evolving/moderate complexity  EVALUATION COMPLEXITY: Moderate  PLAN:  PT FREQUENCY: 2x/week  PT DURATION: 8 weeks + eval   PLANNED INTERVENTIONS: Therapeutic exercises, Therapeutic activity, Neuromuscular re-education, Balance training, Gait training, Patient/Family education, Self Care, Stair training, Orthotic/Fit training, DME instructions, and Aquatic Therapy  PLAN FOR NEXT SESSION:   cont RLE strengthening; standing exercises 1st - then do LAQ's   Dorsie Gaunt, PT 04/01/2024, 4:59 PM

## 2024-04-01 ENCOUNTER — Encounter: Payer: Self-pay | Admitting: Physical Therapy

## 2024-04-01 ENCOUNTER — Other Ambulatory Visit (HOSPITAL_COMMUNITY): Payer: Self-pay

## 2024-04-06 ENCOUNTER — Other Ambulatory Visit (HOSPITAL_COMMUNITY): Payer: Self-pay

## 2024-04-07 ENCOUNTER — Ambulatory Visit: Admitting: Physical Therapy

## 2024-04-07 DIAGNOSIS — M6281 Muscle weakness (generalized): Secondary | ICD-10-CM

## 2024-04-07 DIAGNOSIS — R2689 Other abnormalities of gait and mobility: Secondary | ICD-10-CM

## 2024-04-07 NOTE — Therapy (Signed)
 OUTPATIENT PHYSICAL THERAPY NEURO TREATMENT NOTE     Patient Name: [name] MRN: [id number] DOB:[birth year], 63 y.o., female Today's Date: 04/08/2024   PCP: [place], [name]., MD REFERRING PROVIDER: Rawland Caddy, MD  END OF SESSION:  PT End of Session - 04/08/24 1633     Visit Number 11    Number of Visits 17    Date for PT Re-Evaluation 04/15/24    Authorization Type [employer]    Authorization Time Period 02-16-24 -04-25-24    Progress Note Due on Visit 10    PT Start Time 1018    PT Stop Time 1106    PT Time Calculation (min) 48 min    Equipment Utilized During Treatment Gait belt    Activity Tolerance Patient tolerated treatment well    Behavior During Therapy WFL for tasks assessed/performed                    Past Medical History:  Diagnosis Date   Anxiety    Aortic atherosclerosis (HCC) 04/16/2021   Atypical angina (HCC)    Back pain    related to spinal stenosis and disc problem, radiates down left buttocks to leg., weakness occ.   Chest pain    a. 03/2015 Cath: nl cors; b. 03/2021 Cor CTA: Ca2+ = 0. Nl Cors.   Chronic pain syndrome    Dyspnea    hx   GERD (gastroesophageal reflux disease)    Grade I diastolic dysfunction    Headache    Hyperlipidemia    Hypertension    Lumbar post-laminectomy syndrome    LVH (left ventricular hypertrophy) 12/15/2020   a. 11/2020 Echo: EF 65-70%, no rwma, sev asymm LVH with IVSd 1.9 cm. No LVOT obs @ rest. Gr1 DD. Triv MR.   PONV (postoperative nausea and vomiting)    Pulmonary nodules    a. 03/2021 CT Chest: 3mm pulm nodules in bilat lower lobes. F/u 1 yr.   Right foot drop    Syncope    a. 11/2020 Zio: No significant arrhythmias.   Vaginal foreign object    Uses Femring    Past Surgical History:  Procedure Laterality Date   ABDOMINAL HYSTERECTOMY     ANTERIOR CERVICAL DECOMP/DISCECTOMY FUSION N/A 01/03/2022   Procedure: Anterior Cervical Decompression Fusion - Cervical four-Cervical  five - Cervical five-Cervical six;  Surgeon: Isadora Mar, MD;  Location: [facility] OR;  Service: Neurosurgery;  Laterality: N/A;   BIOPSY  12/16/2020   Procedure: BIOPSY;  Surgeon: Asencion Blacksmith, MD;  Location: [facility] ENDOSCOPY;  Service: Endoscopy;;   CARDIAC CATHETERIZATION N/A 04/18/2015   Procedure: Left Heart Cath and Coronary Angiography;  Surgeon: Chapman Commodore, MD;  Location: [facility] INVASIVE CV LAB;  Service: Cardiovascular;  Laterality: N/A;   COLONOSCOPY     COLONOSCOPY W/ BIOPSIES AND POLYPECTOMY  2018   ESOPHAGOGASTRODUODENOSCOPY (EGD) WITH PROPOFOL  N/A 12/16/2020   Procedure: ESOPHAGOGASTRODUODENOSCOPY (EGD) WITH PROPOFOL ;  Surgeon: Asencion Blacksmith, MD;  Location: [facility] ENDOSCOPY;  Service: Endoscopy;  Laterality: N/A;   FOOT SURGERY Bilateral    [facility] bunion,bone spur, tendon (1) -6'16, (1)-10'16   HEMATOMA EVACUATION N/A 01/05/2022   Procedure: Cervical Wound Exploration;  Surgeon: Audie Bleacher, MD;  Location: [facility] OR;  Service: Neurosurgery;  Laterality: N/A;   IR EPIDUROGRAPHY  07/21/2018   LUMBAR LAMINECTOMY/DECOMPRESSION MICRODISCECTOMY Bilateral 12/28/2015   Procedure: MICRO LUMBAR DECOMPRESSION L4 - L5 BILATERALLY;  Surgeon: Orvan Blanch, MD;  Location: [facility] ORS;  Service: Orthopedics;  Laterality: Bilateral;  LUMBAR LAMINECTOMY/DECOMPRESSION MICRODISCECTOMY Bilateral 03/04/2018   Procedure: Revision of Microlumbar Decompression Bilateral Lumbar Four-Five;  Surgeon: Orvan Blanch, MD;  Location: [facility] OR;  Service: Orthopedics;  Laterality: Bilateral;  90 mins   LUMBAR WOUND DEBRIDEMENT N/A 12/25/2023   Procedure: Irrigation and debridement of lumbar wound;  Surgeon: Joaquin Mulberry, MD;  Location: [facility] OR;  Service: Neurosurgery;  Laterality: N/A;   POSTERIOR FUSION PEDICLE SCREW PLACEMENT N/A 12/07/2023   Procedure: LUMBAR FOUR-FIVE POSTERIOR LATERAL FUSION;  Surgeon: Joaquin Mulberry, MD;  Location: [facility] OR;  Service: Neurosurgery;  Laterality: N/A;   SAVORY DILATION N/A  12/16/2020   Procedure: SAVORY DILATION;  Surgeon: Asencion Blacksmith, MD;  Location: [facility] ENDOSCOPY;  Service: Endoscopy;  Laterality: N/A;   SPINAL CORD STIMULATOR INSERTION N/A 09/28/2019   Procedure: THORACIC SPINAL CORD STIMULATOR INSERTION;  Surgeon: Mort Ards, MD;  Location: [facility] OR;  Service: Orthopedics;  Laterality: N/A;  2.5 hrs   SPINAL CORD STIMULATOR REMOVAL N/A 05/27/2021   Procedure: LUMBAR SPINAL CORD STIMULATOR REMOVAL;  Surgeon: Berta Brittle, MD;  Location: [facility] ORS;  Service: Neurosurgery;  Laterality: N/A;   TUBAL LIGATION     WISDOM TOOTH EXTRACTION     WOUND EXPLORATION N/A 03/04/2018   Procedure: EXPLORATION OF LUMBAR DECOMPRESSION WOUND;  Surgeon: Orvan Blanch, MD;  Location: [facility] OR;  Service: Orthopedics;  Laterality: N/A;   Patient Active Problem List   Diagnosis Date Noted   Seasonal allergic rhinitis due to pollen 01/29/2024   Delayed wound healing 12/25/2023   S/P lumbar fusion 12/07/2023   Pain in thumb joint with movement of right hand 08/12/2023   Localized primary osteoarthritis of carpometacarpal joint of right thumb 07/29/2023   Right foot drop 07/29/2023   Frequent falls 04/14/2023   Gastroesophageal reflux disease 04/14/2023   Right upper extremity numbness 09/29/2022   Stage 3a chronic kidney disease (HCC) 09/29/2022   Cervical spondylosis with radiculopathy 01/05/2022   S/P cervical spinal fusion 01/03/2022   Cervical disc disorder with radiculopathy of cervical region 11/27/2021   Uncontrolled pain 05/27/2021   Syncope 05/13/2021   Depression with anxiety 05/13/2021   Chronic diastolic CHF (congestive heart failure) (HCC) 05/13/2021   Chronic back pain 05/13/2021   Chest tightness 05/13/2021   Lower abdominal pain 05/13/2021   Syncope and collapse 01/01/2021   Abnormal echocardiogram 01/01/2021   BPPV (benign paroxysmal positional vertigo), left 12/26/2020   Odynophagia    Dysphagia 12/15/2020   Postural dizziness with presyncope  12/14/2020   Lumbar post-laminectomy syndrome    Chronic pain 09/28/2019   Lumbar spinal stenosis    Radicular pain    Gait disorder    Difficulty in urination 07/13/2018   Back pain 07/12/2018   Hyperlipidemia 04/16/2018   GAD (generalized anxiety disorder) 04/16/2018   AKI (acute kidney injury) (HCC)    Benign essential HTN    Major depression, recurrent (HCC)    Lumbar radiculopathy 03/09/2018   Paraparesis (HCC) 03/09/2018   Essential hypertension    Chronic nonintractable headache    Leukocytosis    Acute blood loss anemia    Postoperative pain    Generalized weakness    Spinal stenosis at L4-L5 level 03/04/2018   Spinal stenosis of lumbar region 12/28/2015   Chest pain 04/16/2015    ONSET DATE: Most recent Surgery date 12-07-23:  Referral date 02-03-24  REFERRING DIAG:  M54.16 (ICD-10-CM) - Lumbar radiculopathy M96.1 (ICD-10-CM) - Lumbar post-laminectomy syndrome R26.9 (ICD-10-CM) - Gait disorder  THERAPY DIAG:  Other abnormalities of gait  and mobility  Muscle weakness (generalized)  Rationale for Evaluation and Treatment: Rehabilitation  SUBJECTIVE:                                                                                                                                                                                             SUBJECTIVE STATEMENT: Pt reports her Rt leg gave way a little at home but she did not fall - felt her Rt leg was getting weak so she just sat down on the bed. Says she did have the brace on both times when it got weak and became shaky Pt accompanied by: self  PERTINENT HISTORY: The patient was admitted on 12/07/2023 &  underwent PLIF L4-5. Hospitalized 12-07-23 - 12-11-23:  The patient was admitted on 12/25/2023 and taken to the operating room where the patient underwent irrigation and debridement of lumbar wound with primary closure and placement of external wound VAC- hospitalized 12-25-23 -  12-26-23  Frequent falls:  Spinal cord  stimulator insertion on 09-28-19; stimulator removal on 05-27-21 with resultant LLE weakness;  decompression and laminectomy on 12/28/15 at L4-L5  with redo decompression surgery on May 9,2019, post-op paraparesis with surgery to remove small hematoma; 07/12/18 fall at work (involved rolling chair) with hospital admission, transfer to CIR with d/c home 08/03/18; HTN, Bil foot surgery; left ventricular hypertrophy, chronic pain syndrome, dyspnea, hypertrophic cardiomyopathy; cervical fusion March 2023;  had carpal tunnel surgery RUE in April 2024:  LVH  PAIN:  Are you having pain? Pt rates pain 5/10 in lumbar region - greater pain in lower lumbar region near sacral area Alleviating Factor:  placing pillow behind back when sitting Aggravating Factors: certain movements    PRECAUTIONS: Fall; No bending/lifting or twisting; pt states she has a reacher  WEIGHT BEARING RESTRICTIONS: No  FALLS: Has patient fallen in last 6 months? Yes. Number of falls 3  LIVING ENVIRONMENT: Lives with: lives with their spouse Lives in: House/apartment Stairs: Yes: External: 5 steps; on right going up Has following equipment at home: Single point cane, Wheelchair (manual), and L[place]strand crutch  PLOF: Independent with basic ADLs, Independent with household mobility with device, Independent with community mobility with device, and Independent with transfers  PATIENT GOALS: improve walking and strength Rt leg  OBJECTIVE:   DIAGNOSTIC FINDINGS: IMPRESSION: 1. Interval anterior discectomy and fusion from C4 through C6 with improved patency of the spinal canal and only mild residual foraminal narrowing. 2. Stable mild spinal stenosis and mild to moderate left foraminal narrowing at C3-4. 3. Stable mild spinal stenosis and mild foraminal narrowing bilaterally at C6-7. Mild foraminal narrowing bilaterally at C7-T1. 4. No  cord deformity or abnormal cord signal.  No acute findings. No recent MRI of lumbar  region  COGNITION: Overall cognitive status: Within functional limits for tasks assessed   SENSATION: WFL  COORDINATION: Decreased RLE due to weakness and spasticity  POSTURE: No Significant postural limitations  LOWER EXTREMITY ROM:   LLE WFL's;  RLE WFL's except for ankle ROM which is minimal due to weakness; Pt has difficulty performing Rt knee extension due to Rt quad weakness and spasticity - turned body slightly to Lt to achieve more knee extension   LOWER EXTREMITY MMT:    MMT Right Eval Left Eval  Hip flexion 3- 3+   Hip extension    Hip abduction    Hip adduction    Hip internal rotation    Hip external rotation    Knee flexion 3+   Knee extension 4- 4  Ankle dorsiflexion 2-   Ankle plantarflexion    Ankle inversion    Ankle eversion    (Blank rows = not tested)  BED MOBILITY:  Modified independent  TRANSFERS: Assistive device utilized: Rollator  Sit to stand: Modified independence Stand to sit: Modified independence   STAIRS:  Not tested due to c/o back pain   GAIT: Gait pattern: decreased step length- Right, decreased stance time- Right, decreased hip/knee flexion- Right, and decreased ankle dorsiflexion- Right Distance walked: 50' Assistive device utilized: with rollator Level of assistance: SBA and CGA Comments: slow gait speed due to c/o back pain and RLE weakness  FUNCTIONAL TESTS:  Sit to stand score - 2 reps - with bil. UE support from chair- 29.91 secs             TUG score: - 49.19 secs with rollator  Gait velocity:  37.38 secs = .88 ft/sec with rollator  PATIENT SURVEYS:  N/A - based on diagnosis of RLE weakness due to chronic lumbar radiculopathy   TODAY'S TREATMENT: 04-07-24                                                                                                                           TherAct:  Pt performed step negotiation - 4 steps x 3 reps - using Lt rail with ascension and descension - with SBA (for functional  strengthening)  Tap ups RLE onto 4 step 10 reps x 2 sets  RLE step ups onto 4 step - 10 reps x 1 set; 10 reps; 5 reps   LLE tap ups to 4 step 10 reps x 3 sets:  cues to keep Rt knee slightly flexed for increased knee control  Step down RLE from 2 step-  15 reps with bil. UE support on // bars  Tap ups RLE onto 3 step 10 reps with bil. UE support; 5 reps LUE support only on // bar  Squats x 10 reps with bil. UE support on // bars; squats with Lt foot on 2 step 10 reps with bil. UE support - reps 8-10 difficult due  to RLE fatigue  1/2 march - lifting LLE - 10 reps with bil. UE support on // bars     TherEx:       Scifit level 2.5 - bil. UE and LE's x 10;  ice pack applied to low back due to c/o burning sensation after completion of above exercises performed at bar at mirrored wall   Pt using rollator and wearing Blue rocker AFO on RLE - pt modified independent with use of rollator              PATIENT EDUCATION: Education details:  reviewed previous HEP Person educated: Patient Education method: Explanation - handout declined as pt stated she has it from previous OP admission Education comprehension: verbalized understanding  HOME EXERCISE PROGRAM:   GOALS: Goals reviewed with patient? Yes  SHORT TERM GOALS: Target date:  03-15-24   Pt will increase gait velocity to >/= 1.2 ft/sec with use of rollator for incr. Gait efficiency. Baseline: 37.38 secs = .88 ft/sec with rollator   50.62 secs with rollator = .02 ft/sec with rollator (due to c/o back pain) Goal status: Not met 03-17-24  2.  Amb. 115' with RW with SBA without requiring seated rest period. Baseline:  Goal status: Goal met 03-17-24  3.  Perform 5x sit to stand with bil. UE support with pt demonstrating equal weight bearing on each leg. Baseline: 2x with bil. UE support - due to c/o back pain Goal status: Goal met 03-17-24  4.  Improved TUG score to </= 35 secs with use of rollator for improved  mobility and reduced fall risk. Baseline: 49.19 secs with rollator;  34.10 secs with rollator - 03-17-24 Goal status: Goal met 03-17-24  5.  Negotiate 4 steps with CGA with use of bil. Hand rails Baseline:  Goal status: Not tested on 03-17-24 per pt's request due to increased back pain/soreness  6.  Independent in HEP for RLE strengthening.  Baseline:  Goal status: Goal met 03-17-24    LONG TERM GOALS: Target date: 04-15-24  Amb. 230' with RW with SBA without requiring seated rest period. Baseline:  Goal status: INITIAL  2.  Improve TUG score to </= 28 secs  with use of RW for improved mobility and reduced fall risk. Baseline: 49.19 secs with rollator Goal status: INITIAL  3.  Perform 5x sit to stand transfers with LUE support only to demo improved strength in RLE.  Baseline:  Goal status: INITIAL  4.  Modified independent step negotiation with use of bil. Hand rails using step by step sequence. Baseline:  Goal status: INITIAL  5.  Increase gait velocity to >/= 1.6 ft/sec with rollator for increased gait efficiency with reduced fall risk.          Baseline: 37.38 secs = .88 ft/sec with rollator          Goal status:  INITIAL   6.  Independent in updated HEP for RLE strengthening. Baseline:  Goal status: INITIAL   7.  Report reduced low back pain at rest to </= 3/10 intensity.            Baseline:  7/10            Goal status:  INITIAL  ASSESSMENT:  CLINICAL IMPRESSION: PT session focused on RLE strengthening in closed chain position; attempted to increase # of reps with step up exercise and with tap up exercise to 4 step to increase muscle strength and endurance of Rt quads and hip flexors.  Pt able to perform tap up exercise with RLE to 4 step for 3 sets of 10 reps - most reps of this exercise performed in PT to current date.  Cont with POC.    OBJECTIVE IMPAIRMENTS: Abnormal gait, decreased balance, decreased coordination, decreased endurance, decreased strength, and  impaired tone.   ACTIVITY LIMITATIONS: carrying, bending, squatting, stairs, transfers, and locomotion level  PARTICIPATION LIMITATIONS: cleaning, laundry, shopping, and community activity  PERSONAL FACTORS: Behavior pattern, Past/current experiences, and Time since onset of injury/illness/exacerbation are also affecting patient's functional outcome.   REHAB POTENTIAL: Good  CLINICAL DECISION MAKING: Evolving/moderate complexity  EVALUATION COMPLEXITY: Moderate  PLAN:  PT FREQUENCY: 2x/week  PT DURATION: 8 weeks + eval   PLANNED INTERVENTIONS: Therapeutic exercises, Therapeutic activity, Neuromuscular re-education, Balance training, Gait training, Patient/Family education, Self Care, Stair training, Orthotic/Fit training, DME instructions, and Aquatic Therapy  PLAN FOR NEXT SESSION:    Update HEP for Rt quad strengthening -- cont RLE strengthening; standing exercises 1st - then do LAQ's   Dorsie Gaunt, PT 04/08/2024, 4:36 PM

## 2024-04-08 ENCOUNTER — Other Ambulatory Visit (HOSPITAL_COMMUNITY): Payer: Self-pay

## 2024-04-08 ENCOUNTER — Encounter: Payer: Self-pay | Admitting: Physical Therapy

## 2024-04-08 ENCOUNTER — Other Ambulatory Visit (HOSPITAL_BASED_OUTPATIENT_CLINIC_OR_DEPARTMENT_OTHER): Payer: Self-pay | Admitting: Family

## 2024-04-08 DIAGNOSIS — I10 Essential (primary) hypertension: Secondary | ICD-10-CM

## 2024-04-08 MED ORDER — CARVEDILOL 25 MG PO TABS
25.0000 mg | ORAL_TABLET | Freq: Two times a day (BID) | ORAL | 3 refills | Status: AC
  Filled 2024-04-08: qty 180, 90d supply, fill #0
  Filled 2024-09-30: qty 180, 90d supply, fill #1

## 2024-04-11 ENCOUNTER — Other Ambulatory Visit: Payer: Self-pay

## 2024-04-11 ENCOUNTER — Other Ambulatory Visit (HOSPITAL_COMMUNITY): Payer: Self-pay

## 2024-04-13 ENCOUNTER — Encounter: Payer: Self-pay | Admitting: Family Medicine

## 2024-04-13 ENCOUNTER — Ambulatory Visit (INDEPENDENT_AMBULATORY_CARE_PROVIDER_SITE_OTHER): Payer: PPO | Admitting: Family Medicine

## 2024-04-13 VITALS — BP 136/80 | HR 85 | Resp 16 | Ht 65.0 in

## 2024-04-13 DIAGNOSIS — R0789 Other chest pain: Secondary | ICD-10-CM | POA: Diagnosis not present

## 2024-04-13 DIAGNOSIS — N1831 Chronic kidney disease, stage 3a: Secondary | ICD-10-CM | POA: Diagnosis not present

## 2024-04-13 DIAGNOSIS — F411 Generalized anxiety disorder: Secondary | ICD-10-CM | POA: Diagnosis not present

## 2024-04-13 DIAGNOSIS — I10 Essential (primary) hypertension: Secondary | ICD-10-CM

## 2024-04-13 DIAGNOSIS — E559 Vitamin D deficiency, unspecified: Secondary | ICD-10-CM | POA: Insufficient documentation

## 2024-04-13 NOTE — Assessment & Plan Note (Addendum)
 Problem is not well controlled, aggravated by chronic health problems. Recommend taking Alprazolam  0.5 1/2 tab at bedtime and another 1/2 if needed. Increase Duloxetine  from 30 mg to 60 mg daily. Instructed to let me know if problem improved with these changes in about 6 weeks.

## 2024-04-13 NOTE — Assessment & Plan Note (Signed)
 Stable otherwise, last Cr 1.1 and eGFR 53. Continue adequate hydration, low salt diet,and avoidance of NSAID's.

## 2024-04-13 NOTE — Assessment & Plan Note (Signed)
 BP otherwise adequately controlled. Continue Spironolactone 50 mg daily, Carvedilol 25 mg bid, Hydralazine 50 mg tid, and Amlodipine 5 mg daily. Continue low salt diet.

## 2024-04-13 NOTE — Assessment & Plan Note (Signed)
 This chest pain she is reporting today is more suggestive of musculoskeletal pain, elicited with left shoulder ROM and with palpation. Still instructed about warning signs. F/U with her cardiologist was recommended if new symptoms present or it gets worse.

## 2024-04-13 NOTE — Patient Instructions (Addendum)
 A few things to remember from today's visit:  Essential hypertension  Stage 3a chronic kidney disease (HCC)  GAD (generalized anxiety disorder)  Other chest pain  Increase Duloxetine  from 30 mg to 60 mg, 2 caps daily and let me known in 6 weeks if you feel like anxiety is better. Chest pain seems to be muscular, call your cardiologist if worse. Xanax  1/2 tab daily at bedtime.  If you need refills for medications you take chronically, please call your pharmacy. Do not use My Chart to request refills or for acute issues that need immediate attention. If you send a my chart message, it may take a few days to be addressed, specially if I am not in the office.  Please be sure medication list is accurate. If a new problem present, please set up appointment sooner than planned today.

## 2024-04-13 NOTE — Progress Notes (Signed)
 HPI: [name] is a 63 y.o. female  with a PMHx significant for HTN, HLD, CKD III, chronic pain disorder, MDD, and GAD, who is here today for chronic disease management.  Last seen on 01/29/2024  Anxiety:  Currently on Alprazolam  0.5 mg 0.5-1 tablet as needed at bedtime. Also on Duloxetine  30 mg daily for anxiety and chronic pain.  She says she is anxious about her breathing while sleeping, especially given her FMHx of heart problems.She is afraid she would stop breathing while she is asleep.  Also having some body aches and chills at night.  She is not seeing a psychotherapist at this time, but says she often talks with a family member who is one. She has had a psychotherapist in the past but did not feel it was very beneficial.   Chest pain:  She complains today of some left-sided upper chest pain radiating into her left shoulder. The pain is constant.  She has also had some associated neck and left upper back pain, and some episodes of dizziness. No associated SOB, diaphoresis,or palpitations Last saw cardiology on 01/01/2024.   Chronic pain:  Her back pain has improved. Still following with pain management on chronic opioids. Not having any problems with constipation.  She was referred to PT by Dr. Rachel Budds.  She is wearing her brace for drop foot in her right foot.   Hypertension:  Medications: Currently on amlodipine  5 mg, carvedilol  25 mg twice daily,, hydralazine  50 mg daily, and spironolactone  50 mg daily.  Side effects: none Negative for worsening headache, visual changes, exertional chest pain, dyspnea,  focal weakness, or edema. CKD: Has not noted gross hematuria, foam in urine,or decreased urine output.  Lab Results  Component Value Date   NA 141 12/29/2023   CL 102 12/29/2023   K 3.8 12/29/2023   CO2 33 (H) 12/29/2023   BUN 9 12/29/2023   CREATININE 1.11 12/29/2023   GFR 53.32 (L) 12/29/2023   CALCIUM  9.6 12/29/2023   PHOS 2.7 06/01/2007   ALBUMIN  3.8 10/14/2023   GLUCOSE 88 12/29/2023   Hyperlipidemia: Currently on atorvastatin  20 mg daily.  Side effects from medication: none Lab Results  Component Value Date   CHOL 199 10/14/2023   HDL 72.60 10/14/2023   LDLCALC 117 (H) 10/14/2023   TRIG 45.0 10/14/2023   CHOLHDL 3 10/14/2023   Vitamin D  deficiency:  She has been taking vitamin D  supplementation, increased by 2000 units last visit. Lab Results  Component Value Date   VD25OH 22.06 (L) 10/14/2023   Review of Systems  Constitutional:  Positive for fatigue. Negative for activity change, appetite change and fever.  HENT:  Negative for sore throat and trouble swallowing.   Respiratory:  Negative for cough and wheezing.   Gastrointestinal:  Negative for abdominal pain, nausea and vomiting.  Genitourinary:  Negative for dysuria.  Musculoskeletal:  Positive for arthralgias, back pain, gait problem and myalgias.  Skin:  Negative for color change and rash.  Neurological:  Negative for syncope and facial asymmetry.  Psychiatric/Behavioral:  Positive for sleep disturbance. Negative for confusion and hallucinations. The patient is nervous/anxious.   See other pertinent positives and negatives in HPI.  Current Outpatient Medications on File Prior to Visit  Medication Sig Dispense Refill   acetaminophen  (TYLENOL ) 500 MG tablet Take 500 mg by mouth every 6 (six) hours as needed for headache.     ALPRAZolam  (XANAX ) 0.5 MG tablet Take 1/2 - 1 tablet by mouth at bedtime as needed  for anxiety. 30 tablet 3   amitriptyline  (ELAVIL ) 25 MG tablet Take 1 tablet (25 mg total) by mouth daily. 90 tablet 2   amLODipine  (NORVASC ) 5 MG tablet Take 1 tablet (5 mg total) by mouth daily. 90 tablet 2   atorvastatin  (LIPITOR) 20 MG tablet Take 1 tablet (20 mg total) by mouth daily. 90 tablet 2   baclofen  (LIORESAL ) 10 MG tablet Take 1 tablet (10 mg total) by mouth 3 (three) times daily as needed. 60 each 3   betamethasone  dipropionate 0.05 % cream Apply  as directed to skin once a day to itchy areas only avoiding normal skin as needed 45 g 0   carvedilol  (COREG ) 25 MG tablet Take 1 tablet (25 mg total) by mouth 2 (two) times daily with a meal. 180 tablet 3   cholecalciferol  (VITAMIN D3) 25 MCG (1000 UNIT) tablet Take 1,000 Units by mouth daily.     cromolyn  (OPTICROM ) 4 % ophthalmic solution Place 1-2 drops into both eyes 4 (four) times daily as needed. 10 mL 1   diphenhydrAMINE  (BENADRYL ) 25 MG tablet Take 25 mg by mouth daily as needed for itching.     docusate sodium  (COLACE) 100 MG capsule Take 100 mg by mouth 2 (two) times daily.     DULoxetine  (CYMBALTA ) 30 MG capsule Take 2 capsules (60 mg total) by mouth daily. 180 capsule 3   Estradiol  Acetate (FEMRING ) 0.05 MG/24HR RING USE ONE RING VAGINALLY EVERY 3 MONTHS AS DIRECTED 1 each 4   fluconazole  (DIFLUCAN ) 200 MG tablet Take 1 tablet by mouth once a week 2 tablet 0   gabapentin  (NEURONTIN ) 600 MG tablet Take 1 tablet (600 mg total) by mouth 3 (three) times daily. 90 tablet 3   hydrALAZINE  (APRESOLINE ) 50 MG tablet Take 1 tablet (50 mg total) by mouth 3 (three) times daily. (Patient taking differently: Take 50 mg by mouth 2 (two) times daily.) 270 tablet 3   levocetirizine (XYZAL  ALLERGY 24HR) 5 MG tablet Take 1 tablet (5 mg total) by mouth every evening. 90 tablet 0   methocarbamol  (ROBAXIN ) 500 MG tablet Take 1 tablet (500 mg total) by mouth 3 (three) times daily as needed for muscle spasm. 30 tablet 1   mometasone  (NASONEX ) 50 MCG/ACT nasal spray Place 2 sprays into the nose daily. 17 g 3   ondansetron  (ZOFRAN ) 4 MG tablet Take 1 tablet (4 mg tablet) 30 - 60 minutes each prep dose 2 tablet 0   pregabalin  (LYRICA ) 300 MG capsule Take 1 capsule (300 mg total) by mouth 2 (two) times daily. 60 capsule 5   spironolactone  (ALDACTONE ) 50 MG tablet Take 1 tablet (50 mg total) by mouth daily. 90 tablet 3   tapentadol  (NUCYNTA ) 50 MG tablet Take 1 tablet (50 mg total) by mouth every 6 (six) to 8  (eight) hours as needed. 30 tablet 0   terbinafine (LAMISIL) 1 % cream Apply 1 Application topically 2 (two) times daily as needed (rash).     [DISCONTINUED] omeprazole  (PRILOSEC) 40 MG capsule Take 1 capsule (40 mg total) by mouth daily. 90 capsule 1   No current facility-administered medications on file prior to visit.    Past Medical History:  Diagnosis Date   Anxiety    Aortic atherosclerosis (HCC) 04/16/2021   Atypical angina (HCC)    Back pain    related to spinal stenosis and disc problem, radiates down left buttocks to leg., weakness occ.   Chest pain    a. 03/2015 Cath: nl cors;  b. 03/2021 Cor CTA: Ca2+ = 0. Nl Cors.   Chronic pain syndrome    Dyspnea    hx   GERD (gastroesophageal reflux disease)    Grade I diastolic dysfunction    Headache    Hyperlipidemia    Hypertension    Lumbar post-laminectomy syndrome    LVH (left ventricular hypertrophy) 12/15/2020   a. 11/2020 Echo: EF 65-70%, no rwma, sev asymm LVH with IVSd 1.9 cm. No LVOT obs @ rest. Gr1 DD. Triv MR.   PONV (postoperative nausea and vomiting)    Pulmonary nodules    a. 03/2021 CT Chest: 3mm pulm nodules in bilat lower lobes. F/u 1 yr.   Right foot drop    Syncope    a. 11/2020 Zio: No significant arrhythmias.   Vaginal foreign object    Uses Femring    Allergies  Allergen Reactions   Cephalosporins Anaphylaxis   Penicillins Anaphylaxis and Hives   Anesthetics, Amide Nausea And Vomiting    Does not know name of it. States they put it on record foot center.    Betadine [Povidone Iodine] Other (See Comments)    Skin Burn caused scar on Left buttock   Latex Hives, Itching, Rash and Other (See Comments)   Peach [Prunus Persica] Hives   Xyzal  [Levocetirizine] Itching    Patient took Xyzal  for the first time to see if med would help her seasonal rhinitis d/t increased pollen - patient stated that she experienced intense itching over her entire body   Claritin [Loratadine]     pruritis    Hydrocodone       Altered mental state, feel spaced out     Social History   Socioeconomic History   Marital status: Married    Spouse name: Arlene Lacy   Number of children: 1   Years of education: Not on file   Highest education level: Not on file  Occupational History   Not on file  Tobacco Use   Smoking status: Never   Smokeless tobacco: Never  Vaping Use   Vaping status: Never Used  Substance and Sexual Activity   Alcohol use: Never   Drug use: Never   Sexual activity: Yes    Partners: Male    Birth control/protection: Post-menopausal, Surgical    Comment: Hysterectomy  Other Topics Concern   Not on file  Social History Narrative   Not on file   Social Drivers of Health   Financial Resource Strain: Low Risk  (10/02/2023)   Overall Financial Resource Strain (CARDIA)    Difficulty of Paying Living Expenses: Not hard at all  Food Insecurity: No Food Insecurity (03/11/2024)   Hunger Vital Sign    Worried About Running Out of Food in the Last Year: Never true    Ran Out of Food in the Last Year: Never true  Transportation Needs: No Transportation Needs (03/11/2024)   PRAPARE - Administrator, Civil Service (Medical): No    Lack of Transportation (Non-Medical): No  Physical Activity: Insufficiently Active (03/11/2024)   Exercise Vital Sign    Days of Exercise per Week: 2 days    Minutes of Exercise per Session: 30 min  Stress: No Stress Concern Present (10/02/2023)   [employer] of Occupational Health - Occupational Stress Questionnaire    Feeling of Stress : Not at all  Social Connections: Socially Integrated (03/11/2024)   Social Connection and Isolation Panel    Frequency of Communication with Friends and Family: More than three times a week  Frequency of Social Gatherings with Friends and Family: More than three times a week    Attends Religious Services: More than 4 times per year    Active Member of Clubs or Organizations: Yes    Attends Tax inspector Meetings: More than 4 times per year    Marital Status: Married    Vitals:   04/13/24 0923  BP: 136/80  Pulse: 85  Resp: 16  SpO2: 97%   Body mass index is 26.96 kg/m.  Physical Exam Vitals and nursing note reviewed.  Constitutional:      General: She is not in acute distress.    Appearance: She is well-developed.  HENT:     Head: Normocephalic and atraumatic.     Mouth/Throat:     Mouth: Mucous membranes are moist.     Pharynx: Uvula midline.   Eyes:     Conjunctiva/sclera: Conjunctivae normal.    Cardiovascular:     Rate and Rhythm: Normal rate and regular rhythm.     Pulses:          Posterior tibial pulses are 2+ on the right side and 2+ on the left side.     Heart sounds: No murmur heard. Pulmonary:     Effort: Pulmonary effort is normal. No respiratory distress.     Breath sounds: Normal breath sounds.  Chest:     Chest wall: Tenderness present.     Comments: Tenderness upon palpation of left infraclavicular and costochondral area. Abdominal:     Palpations: Abdomen is soft. There is no mass.     Tenderness: There is no abdominal tenderness.   Musculoskeletal:     Left shoulder: Tenderness present. Decreased range of motion.     Cervical back: Tenderness present.     Thoracic back: Tenderness present.       Back:     Right lower leg: No edema.     Left lower leg: No edema.   Skin:    General: Skin is warm.     Findings: No erythema or rash.   Neurological:     General: No focal deficit present.     Mental Status: She is alert and oriented to person, place, and time.     Comments: Unstable , antalgic assisted by a cane. Right foot drop with hard brace.    Psychiatric:        Mood and Affect: Affect normal. Mood is anxious.    ASSESSMENT AND PLAN:  Ms. Hoyos was seen today for chronic disease management.   Other chest pain Assessment & Plan: This chest pain she is reporting today is more suggestive of musculoskeletal  pain, elicited with left shoulder ROM and with palpation. Still instructed about warning signs. F/U with her cardiologist was recommended if new symptoms present or it gets worse.   Stage 3a chronic kidney disease (HCC) Assessment & Plan: Stable otherwise, last Cr 1.1 and eGFR 53. Continue adequate hydration, low salt diet,and avoidance of NSAID's.   Essential hypertension Assessment & Plan: BP otherwise adequately controlled. Continue Spironolactone  50 mg daily, Carvedilol  25 mg bid, Hydralazine  50 mg tid, and Amlodipine  5 mg daily. Continue low salt diet.   GAD (generalized anxiety disorder) Assessment & Plan: Problem is not well controlled, aggravated by chronic health problems. Recommend taking Alprazolam  0.5 1/2 tab at bedtime and another 1/2 if needed. Increase Duloxetine  from 30 mg to 60 mg daily. Instructed to let me know if problem improved with these changes in about 6 weeks.  Vitamin D  deficiency, unspecified Assessment & Plan: Continue current dose of vit D supplementation. Will plan on checking next visit.   Return in about 6 months (around 10/13/2024).  I, [name], acting as a scribe for Cheryl Thackston [place], MD., have documented all relevant documentation on the behalf of Cheryl Catalfamo [place], MD, as directed by  Cheryl Crotteau [place], MD while in the presence of Cheryl Arman [place], MD.   I, Cheryl Quade [place], MD, have reviewed all documentation for this visit. The documentation on 04/14/24 for the exam, diagnosis, procedures, and orders are all accurate and complete.  Cheryl Largent G. [place], MD  [facility]. [place] office.

## 2024-04-14 ENCOUNTER — Other Ambulatory Visit: Payer: Self-pay

## 2024-04-14 ENCOUNTER — Other Ambulatory Visit (HOSPITAL_COMMUNITY): Payer: Self-pay

## 2024-04-14 DIAGNOSIS — G5603 Carpal tunnel syndrome, bilateral upper limbs: Secondary | ICD-10-CM | POA: Diagnosis not present

## 2024-04-14 DIAGNOSIS — M65312 Trigger thumb, left thumb: Secondary | ICD-10-CM | POA: Diagnosis not present

## 2024-04-14 NOTE — Assessment & Plan Note (Signed)
 Continue current dose of vit D supplementation. Will plan on checking next visit.

## 2024-04-15 NOTE — Patient Instructions (Signed)
 Visit Information  Thank you for taking time to visit with me today. Please don't hesitate to contact me if I can be of assistance to you before our next scheduled telephone appointment.  Our next appointment is by telephone on 05/02/24 at 9am  Following is a copy of your care plan:   Goals Addressed             This Visit's Progress    VBCI RN Care Plan       Problems:  Chronic Disease Management support and education needs related to Frequent Falls   Goal: Over the next 6 months the Patient will demonstrate ongoing self health care management ability to avoid injury as evidenced by    no falls.   Interventions:   Falls Interventions: Provided written and verbal education re: potential causes of falls and Fall prevention strategies Reviewed medications and discussed potential side effects of medications such as dizziness and frequent urination Assessed for signs and symptoms of orthostatic hypotension  Patient Self-Care Activities:  Take extra care when getting out of bed, sit on the bed for a few minutes to allow blood pressure to stabilize before ambulating. Perform same caution when going from sitting to standing.  Use hand rails when possible, be aware of surroundings when walking.   Plan:  The patient has been provided with contact information for the care management team and has been advised to call with any health related questions or concerns.  Follow up appointment with RNCM changed to 05/02/24 @ 9:00 am              Patient verbalizes understanding of instructions and care plan provided today and agrees to view in MyChart. Active MyChart status and patient understanding of how to access instructions and care plan via MyChart confirmed with patient.     The patient has been provided with contact information for the care management team and has been advised to call with any health related questions or concerns.   Please call the care guide team at [phone] if  you need to cancel or reschedule your appointment.   Please call 1-800-273-TALK (toll free, 24 hour hotline) if you are experiencing a Mental Health or Behavioral Health Crisis or need someone to talk to.  Dontrel Smethers A. Saverio Curling RN, BA, [facility], CRRN [place]  [facility] Population Health RN Care Manager Direct Dial: [phone]  Fax: [phone]

## 2024-04-15 NOTE — Patient Outreach (Signed)
 04/14/2024  Patient [name] informed me our scheduled visit today had to be cut short due to another appointment she had coming up. Patient agreed to speak to me further on 05/02/24. Did report she has had no falls since we last spoke and that her blood pressure has been 120s-130's/60s-70s. Care Plan updated.  Konni Kesinger A. Saverio Curling RN, BA, [facility], CRRN [place]  [facility] Population Health RN Care Manager Direct Dial: [phone]  Fax: [phone]

## 2024-04-18 ENCOUNTER — Other Ambulatory Visit (HOSPITAL_COMMUNITY): Payer: Self-pay

## 2024-04-18 ENCOUNTER — Other Ambulatory Visit: Payer: Self-pay

## 2024-04-18 ENCOUNTER — Other Ambulatory Visit: Payer: Self-pay | Admitting: Physical Medicine & Rehabilitation

## 2024-04-18 MED ORDER — METHOCARBAMOL 500 MG PO TABS
500.0000 mg | ORAL_TABLET | Freq: Three times a day (TID) | ORAL | 6 refills | Status: AC | PRN
  Filled 2024-04-18 (×2): qty 30, 10d supply, fill #0
  Filled 2024-05-30: qty 30, 10d supply, fill #1

## 2024-04-19 ENCOUNTER — Ambulatory Visit: Admitting: Physical Therapy

## 2024-04-19 DIAGNOSIS — M6281 Muscle weakness (generalized): Secondary | ICD-10-CM

## 2024-04-19 DIAGNOSIS — R2681 Unsteadiness on feet: Secondary | ICD-10-CM

## 2024-04-19 DIAGNOSIS — R2689 Other abnormalities of gait and mobility: Secondary | ICD-10-CM

## 2024-04-19 NOTE — Therapy (Unsigned)
 OUTPATIENT PHYSICAL THERAPY NEURO TREATMENT NOTE/RE-CERT     Patient Name: [name] MRN: [id number] DOB:Oct 08, 1961, 63 y.o., female Today's Date: 04/20/2024   PCP: [place], [name]., MD REFERRING PROVIDER: Babs Arthea DASEN, MD  END OF SESSION:  PT End of Session - 04/20/24 1705     Visit Number 12    Number of Visits 17    Date for PT Re-Evaluation 04/15/24    Authorization Type [employer]    Authorization Time Period 02-16-24 -04-25-24    Progress Note Due on Visit 10    PT Start Time 1017    PT Stop Time 1101    PT Time Calculation (min) 44 min    Equipment Utilized During Treatment Gait belt    Activity Tolerance Patient tolerated treatment well    Behavior During Therapy WFL for tasks assessed/performed                     Past Medical History:  Diagnosis Date   Anxiety    Aortic atherosclerosis (HCC) 04/16/2021   Atypical angina (HCC)    Back pain    related to spinal stenosis and disc problem, radiates down left buttocks to leg., weakness occ.   Chest pain    a. 03/2015 Cath: nl cors; b. 03/2021 Cor CTA: Ca2+ = 0. Nl Cors.   Chronic pain syndrome    Dyspnea    hx   GERD (gastroesophageal reflux disease)    Grade I diastolic dysfunction    Headache    Hyperlipidemia    Hypertension    Lumbar post-laminectomy syndrome    LVH (left ventricular hypertrophy) 12/15/2020   a. 11/2020 Echo: EF 65-70%, no rwma, sev asymm LVH with IVSd 1.9 cm. No LVOT obs @ rest. Gr1 DD. Triv MR.   PONV (postoperative nausea and vomiting)    Pulmonary nodules    a. 03/2021 CT Chest: 3mm pulm nodules in bilat lower lobes. F/u 1 yr.   Right foot drop    Syncope    a. 11/2020 Zio: No significant arrhythmias.   Vaginal foreign object    Uses Femring    Past Surgical History:  Procedure Laterality Date   ABDOMINAL HYSTERECTOMY     ANTERIOR CERVICAL DECOMP/DISCECTOMY FUSION N/A 01/03/2022   Procedure: Anterior Cervical Decompression Fusion - Cervical  four-Cervical five - Cervical five-Cervical six;  Surgeon: Joshua Alm RAMAN, MD;  Location: [facility] OR;  Service: Neurosurgery;  Laterality: N/A;   BIOPSY  12/16/2020   Procedure: BIOPSY;  Surgeon: Aneita Gwendlyn DASEN, MD;  Location: [facility] ENDOSCOPY;  Service: Endoscopy;;   CARDIAC CATHETERIZATION N/A 04/18/2015   Procedure: Left Heart Cath and Coronary Angiography;  Surgeon: Rober Chroman, MD;  Location: [facility] INVASIVE CV LAB;  Service: Cardiovascular;  Laterality: N/A;   COLONOSCOPY     COLONOSCOPY W/ BIOPSIES AND POLYPECTOMY  2018   ESOPHAGOGASTRODUODENOSCOPY (EGD) WITH PROPOFOL  N/A 12/16/2020   Procedure: ESOPHAGOGASTRODUODENOSCOPY (EGD) WITH PROPOFOL ;  Surgeon: Aneita Gwendlyn DASEN, MD;  Location: [facility] ENDOSCOPY;  Service: Endoscopy;  Laterality: N/A;   FOOT SURGERY Bilateral    [facility] bunion,bone spur, tendon (1) -6'16, (1)-10'16   HEMATOMA EVACUATION N/A 01/05/2022   Procedure: Cervical Wound Exploration;  Surgeon: Gillie Duncans, MD;  Location: [facility] OR;  Service: Neurosurgery;  Laterality: N/A;   IR EPIDUROGRAPHY  07/21/2018   LUMBAR LAMINECTOMY/DECOMPRESSION MICRODISCECTOMY Bilateral 12/28/2015   Procedure: MICRO LUMBAR DECOMPRESSION L4 - L5 BILATERALLY;  Surgeon: Reyes Billing, MD;  Location: [facility] ORS;  Service: Orthopedics;  Laterality:  Bilateral;   LUMBAR LAMINECTOMY/DECOMPRESSION MICRODISCECTOMY Bilateral 03/04/2018   Procedure: Revision of Microlumbar Decompression Bilateral Lumbar Four-Five;  Surgeon: Duwayne Purchase, MD;  Location: [facility] OR;  Service: Orthopedics;  Laterality: Bilateral;  90 mins   LUMBAR WOUND DEBRIDEMENT N/A 12/25/2023   Procedure: Irrigation and debridement of lumbar wound;  Surgeon: Joshua Alm Hamilton, MD;  Location: [facility] OR;  Service: Neurosurgery;  Laterality: N/A;   POSTERIOR FUSION PEDICLE SCREW PLACEMENT N/A 12/07/2023   Procedure: LUMBAR FOUR-FIVE POSTERIOR LATERAL FUSION;  Surgeon: Joshua Alm Hamilton, MD;  Location: [facility] OR;  Service: Neurosurgery;  Laterality: N/A;   SAVORY  DILATION N/A 12/16/2020   Procedure: SAVORY DILATION;  Surgeon: Aneita Gwendlyn DASEN, MD;  Location: [facility] ENDOSCOPY;  Service: Endoscopy;  Laterality: N/A;   SPINAL CORD STIMULATOR INSERTION N/A 09/28/2019   Procedure: THORACIC SPINAL CORD STIMULATOR INSERTION;  Surgeon: Burnetta Aures, MD;  Location: [facility] OR;  Service: Orthopedics;  Laterality: N/A;  2.5 hrs   SPINAL CORD STIMULATOR REMOVAL N/A 05/27/2021   Procedure: LUMBAR SPINAL CORD STIMULATOR REMOVAL;  Surgeon: Bluford Standing, MD;  Location: [facility] ORS;  Service: Neurosurgery;  Laterality: N/A;   TUBAL LIGATION     WISDOM TOOTH EXTRACTION     WOUND EXPLORATION N/A 03/04/2018   Procedure: EXPLORATION OF LUMBAR DECOMPRESSION WOUND;  Surgeon: Duwayne Purchase, MD;  Location: [facility] OR;  Service: Orthopedics;  Laterality: N/A;   Patient Active Problem List   Diagnosis Date Noted   Vitamin D  deficiency, unspecified 04/13/2024   Seasonal allergic rhinitis due to pollen 01/29/2024   Delayed wound healing 12/25/2023   S/P lumbar fusion 12/07/2023   Pain in thumb joint with movement of right hand 08/12/2023   Localized primary osteoarthritis of carpometacarpal joint of right thumb 07/29/2023   Right foot drop 07/29/2023   Frequent falls 04/14/2023   Gastroesophageal reflux disease 04/14/2023   Right upper extremity numbness 09/29/2022   Stage 3a chronic kidney disease (HCC) 09/29/2022   Cervical spondylosis with radiculopathy 01/05/2022   S/P cervical spinal fusion 01/03/2022   Cervical disc disorder with radiculopathy of cervical region 11/27/2021   Uncontrolled pain 05/27/2021   Syncope 05/13/2021   Depression with anxiety 05/13/2021   Chronic diastolic CHF (congestive heart failure) (HCC) 05/13/2021   Chronic back pain 05/13/2021   Chest tightness 05/13/2021   Lower abdominal pain 05/13/2021   Syncope and collapse 01/01/2021   Abnormal echocardiogram 01/01/2021   BPPV (benign paroxysmal positional vertigo), left 12/26/2020   Odynophagia     Dysphagia 12/15/2020   Postural dizziness with presyncope 12/14/2020   Lumbar post-laminectomy syndrome    Chronic pain 09/28/2019   Lumbar spinal stenosis    Radicular pain    Gait disorder    Difficulty in urination 07/13/2018   Back pain 07/12/2018   Hyperlipidemia 04/16/2018   GAD (generalized anxiety disorder) 04/16/2018   AKI (acute kidney injury) (HCC)    Benign essential HTN    Major depression, recurrent (HCC)    Lumbar radiculopathy 03/09/2018   Paraparesis (HCC) 03/09/2018   Essential hypertension    Chronic nonintractable headache    Leukocytosis    Acute blood loss anemia    Postoperative pain    Generalized weakness    Spinal stenosis at L4-L5 level 03/04/2018   Spinal stenosis of lumbar region 12/28/2015   Chest pain 04/16/2015    ONSET DATE: Most recent Surgery date 12-07-23:  Referral date 02-03-24  REFERRING DIAG:  M54.16 (ICD-10-CM) - Lumbar radiculopathy M96.1 (ICD-10-CM) - Lumbar post-laminectomy syndrome R26.9 (ICD-10-CM) -  Gait disorder  THERAPY DIAG:  Other abnormalities of gait and mobility  Muscle weakness (generalized)  Unsteadiness on feet  Rationale for Evaluation and Treatment: Rehabilitation  SUBJECTIVE:                                                                                                                                                                                             SUBJECTIVE STATEMENT: Pt reports she has done the exercises some in past week - has been doing LAQ's with red band in sitting;  pt reports she continues to have the burning sensation in her low back - has appt on Thursday this week with Dr. Joshua and is going to ask him about why this is still occurring Pt accompanied by: self  PERTINENT HISTORY: The patient was admitted on 12/07/2023 &  underwent PLIF L4-5. Hospitalized 12-07-23 - 12-11-23:  The patient was admitted on 12/25/2023 and taken to the operating room where the patient underwent irrigation and  debridement of lumbar wound with primary closure and placement of external wound VAC- hospitalized 12-25-23 -  12-26-23  Frequent falls:  Spinal cord stimulator insertion on 09-28-19; stimulator removal on 05-27-21 with resultant LLE weakness;  decompression and laminectomy on 12/28/15 at L4-L5  with redo decompression surgery on May 9,2019, post-op paraparesis with surgery to remove small hematoma; 07/12/18 fall at work (involved rolling chair) with hospital admission, transfer to CIR with d/c home 08/03/18; HTN, Bil foot surgery; left ventricular hypertrophy, chronic pain syndrome, dyspnea, hypertrophic cardiomyopathy; cervical fusion March 2023;  had carpal tunnel surgery RUE in April 2024:  LVH  PAIN:  Are you having pain? Pt rates pain 5/10 in lumbar region - greater pain in lower lumbar region near sacral area- same burning sensation Alleviating Factor:  placing pillow behind back when sitting Aggravating Factors: certain movements    PRECAUTIONS: Fall; No bending/lifting or twisting; pt states she has a reacher  WEIGHT BEARING RESTRICTIONS: No  FALLS: Has patient fallen in last 6 months? Yes. Number of falls 3  LIVING ENVIRONMENT: Lives with: lives with their spouse Lives in: House/apartment Stairs: Yes: External: 5 steps; on right going up Has following equipment at home: Single point cane, Wheelchair (manual), and L[place]strand crutch  PLOF: Independent with basic ADLs, Independent with household mobility with device, Independent with community mobility with device, and Independent with transfers  PATIENT GOALS: improve walking and strength Rt leg  OBJECTIVE:   DIAGNOSTIC FINDINGS: IMPRESSION: 1. Interval anterior discectomy and fusion from C4 through C6 with improved patency of the spinal canal and only mild residual foraminal narrowing. 2. Stable mild spinal stenosis and mild to  moderate left foraminal narrowing at C3-4. 3. Stable mild spinal stenosis and mild foraminal  narrowing bilaterally at C6-7. Mild foraminal narrowing bilaterally at C7-T1. 4. No cord deformity or abnormal cord signal.  No acute findings. No recent MRI of lumbar region  COGNITION: Overall cognitive status: Within functional limits for tasks assessed   SENSATION: WFL  COORDINATION: Decreased RLE due to weakness and spasticity  POSTURE: No Significant postural limitations  LOWER EXTREMITY ROM:   LLE WFL's;  RLE WFL's except for ankle ROM which is minimal due to weakness; Pt has difficulty performing Rt knee extension due to Rt quad weakness and spasticity - turned body slightly to Lt to achieve more knee extension   LOWER EXTREMITY MMT:    MMT Right Eval Left Eval  Hip flexion 3- 3+   Hip extension    Hip abduction    Hip adduction    Hip internal rotation    Hip external rotation    Knee flexion 3+   Knee extension 4- 4  Ankle dorsiflexion 2-   Ankle plantarflexion    Ankle inversion    Ankle eversion    (Blank rows = not tested)  BED MOBILITY:  Modified independent  TRANSFERS: Assistive device utilized: Rollator  Sit to stand: Modified independence Stand to sit: Modified independence   STAIRS:  Not tested due to c/o back pain   GAIT: Gait pattern: decreased step length- Right, decreased stance time- Right, decreased hip/knee flexion- Right, and decreased ankle dorsiflexion- Right Distance walked: 50' Assistive device utilized: with rollator Level of assistance: SBA and CGA Comments: slow gait speed due to c/o back pain and RLE weakness  FUNCTIONAL TESTS:  Sit to stand score - 2 reps - with bil. UE support from chair- 29.91 secs             TUG score: - 49.19 secs with rollator  Gait velocity:  37.38 secs = .88 ft/sec with rollator  PATIENT SURVEYS:  N/A - based on diagnosis of RLE weakness due to chronic lumbar radiculopathy   TODAY'S TREATMENT: 04-19-24                                                                                                                            Gait: Pt using rollator and wearing Blue rocker AFO on RLE - pt modified independent with use of rollator  Pt gait trained 134' x 1 rep;  87' x 1 rep - seated rest period needed due to c/o RLE fatigue/onset of instability  Gait velocity:  33.44 secs with rollator = .98 ft/sec with rollator Discussed step negotiation as pt performs at home - pt states she is able to negotiate steps in her garage at home with use of 1 rail - turned sideways as she has done previously in clinic - pt reports no difficulty with negotiating steps at home - uses step by step sequence for ascension and descension  TherAct:  Sit to stand from standard  chair:  pt performed 4 reps in 36.16 secs with bil. UE support - needed to stop after 4th rep due to c/o burning sensation in low back  TUG score 28.5 secs with rollator  Tap ups LLE onto 4 step 10 reps - instructed pt to perform this exercise at home for RLE isometric and closed chain strengthening   TherEx:  LAQ's RLE 5 reps no weight - pt demonstrated as performing at home as part of HEP       Scifit level 2.5 - bil. UE and LE's x 10;  ice pack applied to low back due to c/o burning sensation after completion of above exercises performed at bar at mirrored wall                 PATIENT EDUCATION: Education details:  reviewed previous HEP Person educated: Patient Education method: Explanation - handout declined as pt stated she has it from previous OP admission Education comprehension: verbalized understanding  HOME EXERCISE PROGRAM:   GOALS: Goals reviewed with patient? Yes  LONG TERM GOALS: Target date: 04-15-24  Amb. 230' with RW with SBA without requiring seated rest period. Baseline: 134' x 1, 87' x 1 rep Goal status: Not met 04-19-24  2.  Improve TUG score to </= 28 secs  with use of RW for improved mobility and reduced fall risk. Baseline: 49.19 secs with rollator;  28.5 secs with rollator Goal  status: Partially met 04-19-24  3.  Perform 5x sit to stand transfers with LUE support only to demo improved strength in RLE.  Baseline: 36.16 secs - 4x with bil. UE support from chair - had to stop due to burning sensation in low back Goal status: Not met 04-19-24  4.  Modified independent step negotiation with use of bil. Hand rails using step by step sequence. Baseline:  Goal status: Goal met 04-19-24  5.  Increase gait velocity to >/= 1.6 ft/sec with rollator for increased gait efficiency with reduced fall risk.          Baseline: 37.38 secs = .88 ft/sec with rollator;  33.44 secs = .98 ft/sec with rollator          Goal status:  Not met 04-19-24   6.  Independent in updated HEP for RLE strengthening. Baseline:  Goal status:  Goal met 04-19-24   7.  Report reduced low back pain at rest to </= 3/10 intensity.            Baseline:  7/10;    04-20-24 -- 5/10            Goal status:  Not met - 04-19-24   UPDATED STG'S:  TARGET DATE 05-13-24  1.  Amb. 230' with RW with SBA without requiring seated rest period.             Baseline: 134' x 1, 87' x 1 rep on 04-19-24     Goal status:  Ongoing   2.  Improve TUG score to </= 26 secs  with use of RW for improved mobility and reduced fall risk. Baseline: 49.19 secs with rollator;  28.5 secs with rollator Goal status: Revised  3.  Perform 5x sit to stand from chair with bil. UE support in </= 30 secs to demonstrate improved mobility.     Baseline:  36.16 secs - 4 reps with bil. UE support from chair    Goal status:  NEW    4.  Pt will report reduced pain in low back  to </= 4/10 intensity for increased ease with ADL's and mobility.    Baseline:  5/10 on 04-19-24    Goal status:  Revised  UPDATED LONG TERM GOALS: Target date: 06-10-24   Amb. 350' with RW with SBA without requiring seated rest period. Baseline: 134' x 1, 87' x 1 rep - 04-19-24 Goal status: NEW  2.  Improve TUG score to </= 22 secs  with use of RW for improved mobility and  reduced fall risk. Baseline: 49.19 secs with rollator;  28.5 secs with rollator - 04-19-24 Goal status: NEW  3.  Perform 5x sit to stand transfers with LUE support only to demo improved strength in RLE IN </= 25 secs to demo improved functional mobility. Baseline: 36.16 secs - 4x with bil. UE support from chair - had to stop due to burning sensation in low back Goal status: NEW  4.  Increase gait velocity to >/= 1.6 ft/sec with rollator for increased gait efficiency with reduced fall risk.          Baseline: 37.38 secs = .88 ft/sec with rollator;  33.44 secs = .98 ft/sec with rollator          Goal status:  ONGOING   6.  Independent in updated HEP for RLE strengthening. Baseline:  Goal status:  ONGOING   7.  Report reduced low back pain at rest to </= 3/10 intensity.            Baseline:  7/10;    04-20-24 -- 5/10            Goal status:  ONGOING   ASSESSMENT:  CLINICAL IMPRESSION PT session focused on assessment of LTG's for renewal for recertification with insurance.  Pt is progressing slowly with improving RLE strength and muscle endurance and functional mobility.  Pt continues to require use of rollator for assistance with ambulation.  Pt has met LTG's #4 and 6:  LTG #1 not met as pt able to amb. 134' prior to need for seated rest period due to c/o RLE fatigue (goal 230') with use of rollator.  LTG #2 very closely approximated as TUG score = 28.5 secs with rollator (goal </= 28 secs) with rollator.  LTG #3 not met as pt able to perform sit to stand 4 reps only with bil. UE support from chair - had to stop due to c/o burning sensation in low back.  LTG #5 not met as gait speed = .98 ft/sec with rollator (ony slightly increased from .88 ft/sec with rollator at initial eval in April 2025.  LTG #7 not met as pt reports back pain intensity 5/10 in today's session (pain rated 7/10 at eval, LTG set at 3/10).  Pt's c/o burning sensation in low back impacts mobility and RLE continues to have  significant weakness and decreased muscle endurance.  Renewal completed for 8 additional weeks.  Pt agrees with and requests renewal.  Cont with POC.   OBJECTIVE IMPAIRMENTS: Abnormal gait, decreased balance, decreased coordination, decreased endurance, decreased strength, and impaired tone.   ACTIVITY LIMITATIONS: carrying, bending, squatting, stairs, transfers, and locomotion level  PARTICIPATION LIMITATIONS: cleaning, laundry, shopping, and community activity  PERSONAL FACTORS: Behavior pattern, Past/current experiences, and Time since onset of injury/illness/exacerbation are also affecting patient's functional outcome.   REHAB POTENTIAL: Good  CLINICAL DECISION MAKING: Evolving/moderate complexity  EVALUATION COMPLEXITY: Moderate  PLAN:  PT FREQUENCY: 2x/week  PT DURATION: 8 weeks  PLANNED INTERVENTIONS: Therapeutic exercises, Therapeutic activity, Neuromuscular re-education, Balance training, Gait training,  Patient/Family education, Self Care, Stair training, Orthotic/Fit training, DME instructions, and Aquatic Therapy  PLAN FOR NEXT SESSION:  renewal completed 04-20-24  Update HEP for Rt quad strengthening -- cont RLE strengthening; standing exercises 1st - then do LAQ's   [place], PT 04/20/2024, 5:08 PM

## 2024-04-20 ENCOUNTER — Encounter: Payer: Self-pay | Admitting: Physical Therapy

## 2024-04-21 ENCOUNTER — Other Ambulatory Visit (HOSPITAL_COMMUNITY): Payer: Self-pay

## 2024-04-21 ENCOUNTER — Ambulatory Visit: Payer: Self-pay | Admitting: Physical Therapy

## 2024-04-21 ENCOUNTER — Ambulatory Visit: Admitting: Physical Therapy

## 2024-04-21 DIAGNOSIS — M5416 Radiculopathy, lumbar region: Secondary | ICD-10-CM | POA: Diagnosis not present

## 2024-04-21 DIAGNOSIS — M4316 Spondylolisthesis, lumbar region: Secondary | ICD-10-CM | POA: Diagnosis not present

## 2024-04-26 ENCOUNTER — Ambulatory Visit: Attending: Family Medicine | Admitting: Physical Therapy

## 2024-04-26 VITALS — BP 115/78 | HR 90

## 2024-04-26 DIAGNOSIS — R2689 Other abnormalities of gait and mobility: Secondary | ICD-10-CM | POA: Diagnosis not present

## 2024-04-26 DIAGNOSIS — M6281 Muscle weakness (generalized): Secondary | ICD-10-CM | POA: Insufficient documentation

## 2024-04-26 NOTE — Therapy (Unsigned)
 OUTPATIENT PHYSICAL THERAPY NEURO TREATMENT NOTE     Patient Name: Claire Dolores MRN: [id number] DOB:[birth year], 63 y.o., female Today's Date: 04/27/2024   PCP: [place], [name]., MD REFERRING PROVIDER: Babs Arthea DASEN, MD  END OF SESSION:  PT End of Session - 04/27/24 1732     Visit Number 13    Number of Visits 17    Date for PT Re-Evaluation 04/15/24    Authorization Type [employer]    Authorization Time Period 02-16-24 -04-25-24    Progress Note Due on Visit 10    PT Start Time 1016    PT Stop Time 1100    PT Time Calculation (min) 44 min    Equipment Utilized During Treatment Gait belt    Activity Tolerance Patient tolerated treatment well    Behavior During Therapy [facility] for tasks assessed/performed                      Past Medical History:  Diagnosis Date   Anxiety    Aortic atherosclerosis (HCC) 04/16/2021   Atypical angina (HCC)    Back pain    related to spinal stenosis and disc problem, radiates down left buttocks to leg., weakness occ.   Chest pain    a. 03/2015 Cath: nl cors; b. 03/2021 Cor CTA: Ca2+ = 0. Nl Cors.   Chronic pain syndrome    Dyspnea    hx   GERD (gastroesophageal reflux disease)    Grade I diastolic dysfunction    Headache    Hyperlipidemia    Hypertension    Lumbar post-laminectomy syndrome    LVH (left ventricular hypertrophy) 12/15/2020   a. 11/2020 Echo: EF 65-70%, no rwma, sev asymm LVH with IVSd 1.9 cm. No LVOT obs @ rest. Gr1 DD. Triv MR.   PONV (postoperative nausea and vomiting)    Pulmonary nodules    a. 03/2021 CT Chest: 3mm pulm nodules in bilat lower lobes. F/u 1 yr.   Right foot drop    Syncope    a. 11/2020 Zio: No significant arrhythmias.   Vaginal foreign object    Uses Femring    Past Surgical History:  Procedure Laterality Date   ABDOMINAL HYSTERECTOMY     ANTERIOR CERVICAL DECOMP/DISCECTOMY FUSION N/A 01/03/2022   Procedure: Anterior Cervical Decompression Fusion - Cervical  four-Cervical five - Cervical five-Cervical six;  Surgeon: Joshua Alm RAMAN, MD;  Location: [facility] OR;  Service: Neurosurgery;  Laterality: N/A;   BIOPSY  12/16/2020   Procedure: BIOPSY;  Surgeon: Aneita Gwendlyn DASEN, MD;  Location: [facility] ENDOSCOPY;  Service: Endoscopy;;   CARDIAC CATHETERIZATION N/A 04/18/2015   Procedure: Left Heart Cath and Coronary Angiography;  Surgeon: Rober Chroman, MD;  Location: [facility] INVASIVE CV LAB;  Service: Cardiovascular;  Laterality: N/A;   COLONOSCOPY     COLONOSCOPY W/ BIOPSIES AND POLYPECTOMY  2018   ESOPHAGOGASTRODUODENOSCOPY (EGD) WITH PROPOFOL  N/A 12/16/2020   Procedure: ESOPHAGOGASTRODUODENOSCOPY (EGD) WITH PROPOFOL ;  Surgeon: Aneita Gwendlyn DASEN, MD;  Location: [facility] ENDOSCOPY;  Service: Endoscopy;  Laterality: N/A;   FOOT SURGERY Bilateral    [facility] bunion,bone spur, tendon (1) -6'16, (1)-10'16   HEMATOMA EVACUATION N/A 01/05/2022   Procedure: Cervical Wound Exploration;  Surgeon: Gillie Duncans, MD;  Location: [facility] OR;  Service: Neurosurgery;  Laterality: N/A;   IR EPIDUROGRAPHY  07/21/2018   LUMBAR LAMINECTOMY/DECOMPRESSION MICRODISCECTOMY Bilateral 12/28/2015   Procedure: MICRO LUMBAR DECOMPRESSION L4 - L5 BILATERALLY;  Surgeon: Reyes Billing, MD;  Location: [facility] ORS;  Service: Orthopedics;  Laterality: Bilateral;   LUMBAR LAMINECTOMY/DECOMPRESSION MICRODISCECTOMY Bilateral 03/04/2018   Procedure: Revision of Microlumbar Decompression Bilateral Lumbar Four-Five;  Surgeon: Duwayne Purchase, MD;  Location: [facility] OR;  Service: Orthopedics;  Laterality: Bilateral;  90 mins   LUMBAR WOUND DEBRIDEMENT N/A 12/25/2023   Procedure: Irrigation and debridement of lumbar wound;  Surgeon: Joshua Alm Hamilton, MD;  Location: [facility] OR;  Service: Neurosurgery;  Laterality: N/A;   POSTERIOR FUSION PEDICLE SCREW PLACEMENT N/A 12/07/2023   Procedure: LUMBAR FOUR-FIVE POSTERIOR LATERAL FUSION;  Surgeon: Joshua Alm Hamilton, MD;  Location: [place] OR;  Service: Neurosurgery;  Laterality: N/A;   SAVORY  DILATION N/A 12/16/2020   Procedure: SAVORY DILATION;  Surgeon: Aneita Gwendlyn DASEN, MD;  Location: [facility] ENDOSCOPY;  Service: Endoscopy;  Laterality: N/A;   SPINAL CORD STIMULATOR INSERTION N/A 09/28/2019   Procedure: THORACIC SPINAL CORD STIMULATOR INSERTION;  Surgeon: Burnetta Aures, MD;  Location: [facility] OR;  Service: Orthopedics;  Laterality: N/A;  2.5 hrs   SPINAL CORD STIMULATOR REMOVAL N/A 05/27/2021   Procedure: LUMBAR SPINAL CORD STIMULATOR REMOVAL;  Surgeon: Bluford Standing, MD;  Location: [facility] ORS;  Service: Neurosurgery;  Laterality: N/A;   TUBAL LIGATION     WISDOM TOOTH EXTRACTION     WOUND EXPLORATION N/A 03/04/2018   Procedure: EXPLORATION OF LUMBAR DECOMPRESSION WOUND;  Surgeon: Duwayne Purchase, MD;  Location: [facility] OR;  Service: Orthopedics;  Laterality: N/A;   Patient Active Problem List   Diagnosis Date Noted   Vitamin D  deficiency, unspecified 04/13/2024   Seasonal allergic rhinitis due to pollen 01/29/2024   Delayed wound healing 12/25/2023   S/P lumbar fusion 12/07/2023   Pain in thumb joint with movement of right hand 08/12/2023   Localized primary osteoarthritis of carpometacarpal joint of right thumb 07/29/2023   Right foot drop 07/29/2023   Frequent falls 04/14/2023   Gastroesophageal reflux disease 04/14/2023   Right upper extremity numbness 09/29/2022   Stage 3a chronic kidney disease (HCC) 09/29/2022   Cervical spondylosis with radiculopathy 01/05/2022   S/P cervical spinal fusion 01/03/2022   Cervical disc disorder with radiculopathy of cervical region 11/27/2021   Uncontrolled pain 05/27/2021   Syncope 05/13/2021   Depression with anxiety 05/13/2021   Chronic diastolic CHF (congestive heart failure) (HCC) 05/13/2021   Chronic back pain 05/13/2021   Chest tightness 05/13/2021   Lower abdominal pain 05/13/2021   Syncope and collapse 01/01/2021   Abnormal echocardiogram 01/01/2021   BPPV (benign paroxysmal positional vertigo), left 12/26/2020   Odynophagia     Dysphagia 12/15/2020   Postural dizziness with presyncope 12/14/2020   Lumbar post-laminectomy syndrome    Chronic pain 09/28/2019   Lumbar spinal stenosis    Radicular pain    Gait disorder    Difficulty in urination 07/13/2018   Back pain 07/12/2018   Hyperlipidemia 04/16/2018   GAD (generalized anxiety disorder) 04/16/2018   AKI (acute kidney injury) (HCC)    Benign essential HTN    Major depression, recurrent (HCC)    Lumbar radiculopathy 03/09/2018   Paraparesis (HCC) 03/09/2018   Essential hypertension    Chronic nonintractable headache    Leukocytosis    Acute blood loss anemia    Postoperative pain    Generalized weakness    Spinal stenosis at L4-L5 level 03/04/2018   Spinal stenosis of lumbar region 12/28/2015   Chest pain 04/16/2015    ONSET DATE: Most recent Surgery date 12-07-23:  Referral date 02-03-24  REFERRING DIAG:  M54.16 (ICD-10-CM) - Lumbar radiculopathy M96.1 (ICD-10-CM) - Lumbar post-laminectomy syndrome R26.9 (ICD-10-CM) -  Gait disorder  THERAPY DIAG:  Other abnormalities of gait and mobility  Muscle weakness (generalized)  Rationale for Evaluation and Treatment: Rehabilitation  SUBJECTIVE:                                                                                                                                                                                             SUBJECTIVE STATEMENT: Pt reports she saw Dr. Joshua last Thursday - he said the burning sensation was part of the healing process; was told to call the office if the burning increased and becomes intolerable  Pt accompanied by: self  PERTINENT HISTORY: The patient was admitted on 12/07/2023 &  underwent PLIF L4-5. Hospitalized 12-07-23 - 12-11-23:  The patient was admitted on 12/25/2023 and taken to the operating room where the patient underwent irrigation and debridement of lumbar wound with primary closure and placement of external wound VAC- hospitalized 12-25-23 -  12-26-23  Frequent  falls:  Spinal cord stimulator insertion on 09-28-19; stimulator removal on 05-27-21 with resultant LLE weakness;  decompression and laminectomy on 12/28/15 at L4-L5  with redo decompression surgery on May 9,2019, post-op paraparesis with surgery to remove small hematoma; 07/12/18 fall at work (involved rolling chair) with hospital admission, transfer to CIR with d/c home 08/03/18; HTN, Bil foot surgery; left ventricular hypertrophy, chronic pain syndrome, dyspnea, hypertrophic cardiomyopathy; cervical fusion March 2023;  had carpal tunnel surgery RUE in April 2024:  LVH  PAIN:  Are you having pain? Pt rates pain 5/10 in lumbar region - greater pain in lower lumbar region near sacral area- same burning sensation Alleviating Factor:  placing pillow behind back when sitting Aggravating Factors: certain movements    PRECAUTIONS: Fall; No bending/lifting or twisting; pt states she has a reacher  WEIGHT BEARING RESTRICTIONS: No  FALLS: Has patient fallen in last 6 months? Yes. Number of falls 3  LIVING ENVIRONMENT: Lives with: lives with their spouse Lives in: House/apartment Stairs: Yes: External: 5 steps; on right going up Has following equipment at home: Single point cane, Wheelchair (manual), and L[place]strand crutch  PLOF: Independent with basic ADLs, Independent with household mobility with device, Independent with community mobility with device, and Independent with transfers  PATIENT GOALS: improve walking and strength Rt leg  OBJECTIVE:   DIAGNOSTIC FINDINGS: IMPRESSION: 1. Interval anterior discectomy and fusion from C4 through C6 with improved patency of the spinal canal and only mild residual foraminal narrowing. 2. Stable mild spinal stenosis and mild to moderate left foraminal narrowing at C3-4. 3. Stable mild spinal stenosis and mild foraminal narrowing bilaterally at C6-7. Mild foraminal narrowing bilaterally at C7-T1. 4. No cord  deformity or abnormal cord signal.  No acute  findings. No recent MRI of lumbar region  COGNITION: Overall cognitive status: Within functional limits for tasks assessed   SENSATION: WFL  COORDINATION: Decreased RLE due to weakness and spasticity  POSTURE: No Significant postural limitations  LOWER EXTREMITY ROM:   LLE WFL's;  RLE WFL's except for ankle ROM which is minimal due to weakness; Pt has difficulty performing Rt knee extension due to Rt quad weakness and spasticity - turned body slightly to Lt to achieve more knee extension   LOWER EXTREMITY MMT:    MMT Right Eval Left Eval  Hip flexion 3- 3+   Hip extension    Hip abduction    Hip adduction    Hip internal rotation    Hip external rotation    Knee flexion 3+   Knee extension 4- 4  Ankle dorsiflexion 2-   Ankle plantarflexion    Ankle inversion    Ankle eversion    (Blank rows = not tested)  BED MOBILITY:  Modified independent  TRANSFERS: Assistive device utilized: Rollator  Sit to stand: Modified independence Stand to sit: Modified independence   STAIRS:  Not tested due to c/o back pain   GAIT: Gait pattern: decreased step length- Right, decreased stance time- Right, decreased hip/knee flexion- Right, and decreased ankle dorsiflexion- Right Distance walked: 50' Assistive device utilized: with rollator Level of assistance: SBA and CGA Comments: slow gait speed due to c/o back pain and RLE weakness  FUNCTIONAL TESTS:  Sit to stand score - 2 reps - with bil. UE support from chair- 29.91 secs             TUG score: - 49.19 secs with rollator  Gait velocity:  37.38 secs = .88 ft/sec with rollator  PATIENT SURVEYS:  N/A - based on diagnosis of RLE weakness due to chronic lumbar radiculopathy   TODAY'S TREATMENT: 04-26-24                                                                                                                           Gait: Pt using rollator and wearing Blue rocker AFO on RLE - pt modified independent with use of  rollator  Pt negotiated steps (4 steps x 3 reps) to simulate flight of stairs using Lt handrail - turned sideways - SBA to supervision for safety   TherAct:  Tap ups LLE onto 4 step 15 reps - instructed pt to perform this exercise at home for RLE isometric and closed chain strengthening  Tap ups to 6 step with LLE 15 reps with bil. UE support on // bars  Step ups to 4 step RLE 10 reps;  15 reps 2nd step  Stepping strategy 10 reps each - 3 directions- stepping forward/back, out/in and back/forward with LLE for increased RLE isometric strengthening and cues to keep Rt knee slightly flexed Step down exercise with RLE from 4 step 5 reps for increased Rt quad eccentric strengthening  TherEx:  LAQ's RLE 10 reps - with 2# weight - approx. 4-5 sec hold       Scifit level 2.5 - bil. UE and LE's x 10;  ice pack applied to low back due to c/o burning sensation  UPDATED HEP: Access Code: [email or web address] URL: [email or web address] Date: 04/27/2024 Prepared by: Rock Kussmaul  Exercises - Seated Knee Extension with Resistance  - 1 x daily - 7 x weekly - 3 sets - 10 reps - TAP UPS - with Left leg   - 1 x daily - 7 x weekly - 3 sets - 10 reps - Standing March with Counter Support  - 1 x daily - 7 x weekly - 3 sets - 10 reps - Forward Step Up with Counter Support  - 1 x daily - 7 x weekly - 3 sets - 10 reps - Alternating Step Forward with Support  - 1 x daily - 7 x weekly - 3 sets - 10 reps   PATIENT EDUCATION: Education details:  Medbridge  Person educated: Patient Education method: Explanation - handout declined as pt stated she has it from previous OP admission Education comprehension: verbalized understanding  HOME EXERCISE PROGRAM:   GOALS: Goals reviewed with patient? Yes  LONG TERM GOALS: Target date: 04-15-24  Amb. 230' with RW with SBA without requiring seated rest period. Baseline: 134' x 1, 87' x 1 rep Goal status: Not met 04-19-24  2.  Improve TUG score to </=  28 secs  with use of RW for improved mobility and reduced fall risk. Baseline: 49.19 secs with rollator;  28.5 secs with rollator Goal status: Partially met 04-19-24  3.  Perform 5x sit to stand transfers with LUE support only to demo improved strength in RLE.  Baseline: 36.16 secs - 4x with bil. UE support from chair - had to stop due to burning sensation in low back Goal status: Not met 04-19-24  4.  Modified independent step negotiation with use of bil. Hand rails using step by step sequence. Baseline:  Goal status: Goal met 04-19-24  5.  Increase gait velocity to >/= 1.6 ft/sec with rollator for increased gait efficiency with reduced fall risk.          Baseline: 37.38 secs = .88 ft/sec with rollator;  33.44 secs = .98 ft/sec with rollator          Goal status:  Not met 04-19-24   6.  Independent in updated HEP for RLE strengthening. Baseline:  Goal status:  Goal met 04-19-24   7.  Report reduced low back pain at rest to </= 3/10 intensity.            Baseline:  7/10;    04-20-24 -- 5/10            Goal status:  Not met - 04-19-24   UPDATED STG'S:  TARGET DATE 05-13-24  1.  Amb. 230' with RW with SBA without requiring seated rest period.             Baseline: 134' x 1, 87' x 1 rep on 04-19-24     Goal status:  Ongoing   2.  Improve TUG score to </= 26 secs  with use of RW for improved mobility and reduced fall risk. Baseline: 49.19 secs with rollator;  28.5 secs with rollator Goal status: Revised  3.  Perform 5x sit to stand from chair with bil. UE support in </= 30 secs to demonstrate improved mobility.     Baseline:  36.16 secs - 4 reps with bil. UE support from chair    Goal status:  NEW    4.  Pt will report reduced pain in low back to </= 4/10 intensity for increased ease with ADL's and mobility.    Baseline:  5/10 on 04-19-24    Goal status:  Revised  UPDATED LONG TERM GOALS: Target date: 06-10-24   Amb. 350' with RW with SBA without requiring seated rest  period. Baseline: 134' x 1, 87' x 1 rep - 04-19-24 Goal status: NEW  2.  Improve TUG score to </= 22 secs  with use of RW for improved mobility and reduced fall risk. Baseline: 49.19 secs with rollator;  28.5 secs with rollator - 04-19-24 Goal status: NEW  3.  Perform 5x sit to stand transfers with LUE support only to demo improved strength in RLE IN </= 25 secs to demo improved functional mobility. Baseline: 36.16 secs - 4x with bil. UE support from chair - had to stop due to burning sensation in low back Goal status: NEW  4.  Increase gait velocity to >/= 1.6 ft/sec with rollator for increased gait efficiency with reduced fall risk.          Baseline: 37.38 secs = .88 ft/sec with rollator;  33.44 secs = .98 ft/sec with rollator          Goal status:  ONGOING   6.  Independent in updated HEP for RLE strengthening. Baseline:  Goal status:  ONGOING   7.  Report reduced low back pain at rest to </= 3/10 intensity.            Baseline:  7/10;    04-20-24 -- 5/10            Goal status:  ONGOING   ASSESSMENT:  CLINICAL IMPRESSION PT session focused on RLE strengthening in closed chain position.  Pt continues to have decreased muscle endurance in Rt quads but able to take short seated rest period and resume exercises.  Pt did progress with step down exercise for Rt quad eccentric strengthening from 2 to 4 step but fatigued after performing 5 reps with Rt knee instability noted.  Pt reports she continues to have burning sensation in low back area (MD attributes this to healing process); pt reports she feels the burning sensation more with trunk flexion than she does with standing more upright (trunk extension).  Cont with POC.   OBJECTIVE IMPAIRMENTS: Abnormal gait, decreased balance, decreased coordination, decreased endurance, decreased strength, and impaired tone.   ACTIVITY LIMITATIONS: carrying, bending, squatting, stairs, transfers, and locomotion level  PARTICIPATION LIMITATIONS:  cleaning, laundry, shopping, and community activity  PERSONAL FACTORS: Behavior pattern, Past/current experiences, and Time since onset of injury/illness/exacerbation are also affecting patient's functional outcome.   REHAB POTENTIAL: Good  CLINICAL DECISION MAKING: Evolving/moderate complexity  EVALUATION COMPLEXITY: Moderate  PLAN:  PT FREQUENCY: 2x/week  PT DURATION: 8 weeks  PLANNED INTERVENTIONS: Therapeutic exercises, Therapeutic activity, Neuromuscular re-education, Balance training, Gait training, Patient/Family education, Self Care, Stair training, Orthotic/Fit training, DME instructions, and Aquatic Therapy  PLAN FOR NEXT SESSION:   Check updated HEP for any ?'s -- cont RLE strengthening; try step up exercise with 6 step; standing exercises 1st - then do LAQ's   [place], PT 04/27/2024, 5:35 PM

## 2024-04-27 ENCOUNTER — Encounter: Payer: Self-pay | Admitting: Physical Therapy

## 2024-04-28 ENCOUNTER — Telehealth: Payer: Self-pay | Admitting: Internal Medicine

## 2024-04-28 ENCOUNTER — Ambulatory Visit: Admitting: Physical Therapy

## 2024-04-28 DIAGNOSIS — R2689 Other abnormalities of gait and mobility: Secondary | ICD-10-CM | POA: Diagnosis not present

## 2024-04-28 DIAGNOSIS — M6281 Muscle weakness (generalized): Secondary | ICD-10-CM

## 2024-04-28 NOTE — Telephone Encounter (Signed)
 Pt c/o of Chest Pain: STAT if active (IN THIS MOMENT) CP, including tightness, pressure, jaw pain, shoulder/upper arm/back pain, SOB, nausea, and vomiting.  1. Are you having CP right now (tightness, pressure, or discomfort)? Yes and she emphasized that it gets more severe when laying down   2. Are you experiencing any other symptoms (ex. SOB, nausea, vomiting, sweating)? She was really hot like a hot flash, but not sweating   3. How long have you been experiencing CP? 3-4 weeks   4. Is your CP continuous or coming and going? Coming and going   5. Have you taken Nitroglycerin ? No   6. If CP returns before callback, please consider calling 911. ?

## 2024-04-28 NOTE — Telephone Encounter (Signed)
 Pt made aware of Dr. Mona recommendation

## 2024-04-28 NOTE — Therapy (Signed)
 OUTPATIENT PHYSICAL THERAPY NEURO TREATMENT NOTE     Patient Name: [name] MRN: [id number] DOB:[birth year], 63 y.o., female Today's Date: 04/29/2024   PCP: [place], [name]., MD REFERRING PROVIDER: Babs Arthea DASEN, MD  END OF SESSION:  PT End of Session - 04/29/24 1652     Visit Number 14    Number of Visits 28    Date for PT Re-Evaluation 06/10/24    Authorization Type [employer] Medicare    Authorization Time Period 02-16-24 -04-25-24:  04-19-24 - 06-26-24    Progress Note Due on Visit 20    PT Start Time 1020    PT Stop Time 1102    PT Time Calculation (min) 42 min    Equipment Utilized During Treatment Gait belt    Activity Tolerance Patient tolerated treatment well    Behavior During Therapy [facility] for tasks assessed/performed                       Past Medical History:  Diagnosis Date   Anxiety    Aortic atherosclerosis (HCC) 04/16/2021   Atypical angina (HCC)    Back pain    related to spinal stenosis and disc problem, radiates down left buttocks to leg., weakness occ.   Chest pain    a. 03/2015 Cath: nl cors; b. 03/2021 Cor CTA: Ca2+ = 0. Nl Cors.   Chronic pain syndrome    Dyspnea    hx   GERD (gastroesophageal reflux disease)    Grade I diastolic dysfunction    Headache    Hyperlipidemia    Hypertension    Lumbar post-laminectomy syndrome    LVH (left ventricular hypertrophy) 12/15/2020   a. 11/2020 Echo: EF 65-70%, no rwma, sev asymm LVH with IVSd 1.9 cm. No LVOT obs @ rest. Gr1 DD. Triv MR.   PONV (postoperative nausea and vomiting)    Pulmonary nodules    a. 03/2021 CT Chest: 3mm pulm nodules in bilat lower lobes. F/u 1 yr.   Right foot drop    Syncope    a. 11/2020 Zio: No significant arrhythmias.   Vaginal foreign object    Uses Femring    Past Surgical History:  Procedure Laterality Date   ABDOMINAL HYSTERECTOMY     ANTERIOR CERVICAL DECOMP/DISCECTOMY FUSION N/A 01/03/2022   Procedure: Anterior Cervical Decompression Fusion  - Cervical four-Cervical five - Cervical five-Cervical six;  Surgeon: Joshua Alm RAMAN, MD;  Location: [facility] OR;  Service: Neurosurgery;  Laterality: N/A;   BIOPSY  12/16/2020   Procedure: BIOPSY;  Surgeon: Aneita Gwendlyn DASEN, MD;  Location: [facility] ENDOSCOPY;  Service: Endoscopy;;   CARDIAC CATHETERIZATION N/A 04/18/2015   Procedure: Left Heart Cath and Coronary Angiography;  Surgeon: Rober Chroman, MD;  Location: [facility] INVASIVE CV LAB;  Service: Cardiovascular;  Laterality: N/A;   COLONOSCOPY     COLONOSCOPY W/ BIOPSIES AND POLYPECTOMY  2018   ESOPHAGOGASTRODUODENOSCOPY (EGD) WITH PROPOFOL  N/A 12/16/2020   Procedure: ESOPHAGOGASTRODUODENOSCOPY (EGD) WITH PROPOFOL ;  Surgeon: Aneita Gwendlyn DASEN, MD;  Location: [facility] ENDOSCOPY;  Service: Endoscopy;  Laterality: N/A;   FOOT SURGERY Bilateral    [facility] bunion,bone spur, tendon (1) -6'16, (1)-10'16   HEMATOMA EVACUATION N/A 01/05/2022   Procedure: Cervical Wound Exploration;  Surgeon: Gillie Duncans, MD;  Location: [facility] OR;  Service: Neurosurgery;  Laterality: N/A;   IR EPIDUROGRAPHY  07/21/2018   LUMBAR LAMINECTOMY/DECOMPRESSION MICRODISCECTOMY Bilateral 12/28/2015   Procedure: MICRO LUMBAR DECOMPRESSION L4 - L5 BILATERALLY;  Surgeon: Reyes Billing, MD;  Location: [facility]  ORS;  Service: Orthopedics;  Laterality: Bilateral;   LUMBAR LAMINECTOMY/DECOMPRESSION MICRODISCECTOMY Bilateral 03/04/2018   Procedure: Revision of Microlumbar Decompression Bilateral Lumbar Four-Five;  Surgeon: Duwayne Purchase, MD;  Location: [facility] OR;  Service: Orthopedics;  Laterality: Bilateral;  90 mins   LUMBAR WOUND DEBRIDEMENT N/A 12/25/2023   Procedure: Irrigation and debridement of lumbar wound;  Surgeon: Joshua Alm Hamilton, MD;  Location: [facility] OR;  Service: Neurosurgery;  Laterality: N/A;   POSTERIOR FUSION PEDICLE SCREW PLACEMENT N/A 12/07/2023   Procedure: LUMBAR FOUR-FIVE POSTERIOR LATERAL FUSION;  Surgeon: Joshua Alm [facility], MD;  Location: [facility] OR;  Service: Neurosurgery;  Laterality:  N/A;   SAVORY DILATION N/A 12/16/2020   Procedure: SAVORY DILATION;  Surgeon: Aneita Gwendlyn DASEN, MD;  Location: [facility] ENDOSCOPY;  Service: Endoscopy;  Laterality: N/A;   SPINAL CORD STIMULATOR INSERTION N/A 09/28/2019   Procedure: THORACIC SPINAL CORD STIMULATOR INSERTION;  Surgeon: Burnetta Aures, MD;  Location: [facility] OR;  Service: Orthopedics;  Laterality: N/A;  2.5 hrs   SPINAL CORD STIMULATOR REMOVAL N/A 05/27/2021   Procedure: LUMBAR SPINAL CORD STIMULATOR REMOVAL;  Surgeon: Bluford Standing, MD;  Location: [facility] ORS;  Service: Neurosurgery;  Laterality: N/A;   TUBAL LIGATION     WISDOM TOOTH EXTRACTION     WOUND EXPLORATION N/A 03/04/2018   Procedure: EXPLORATION OF LUMBAR DECOMPRESSION WOUND;  Surgeon: Duwayne Purchase, MD;  Location: [facility] OR;  Service: Orthopedics;  Laterality: N/A;   Patient Active Problem List   Diagnosis Date Noted   Vitamin D  deficiency, unspecified 04/13/2024   Seasonal allergic rhinitis due to pollen 01/29/2024   Delayed wound healing 12/25/2023   S/P lumbar fusion 12/07/2023   Pain in thumb joint with movement of right hand 08/12/2023   Localized primary osteoarthritis of carpometacarpal joint of right thumb 07/29/2023   Right foot drop 07/29/2023   Frequent falls 04/14/2023   Gastroesophageal reflux disease 04/14/2023   Right upper extremity numbness 09/29/2022   Stage 3a chronic kidney disease (HCC) 09/29/2022   Cervical spondylosis with radiculopathy 01/05/2022   S/P cervical spinal fusion 01/03/2022   Cervical disc disorder with radiculopathy of cervical region 11/27/2021   Uncontrolled pain 05/27/2021   Syncope 05/13/2021   Depression with anxiety 05/13/2021   Chronic diastolic CHF (congestive heart failure) (HCC) 05/13/2021   Chronic back pain 05/13/2021   Chest tightness 05/13/2021   Lower abdominal pain 05/13/2021   Syncope and collapse 01/01/2021   Abnormal echocardiogram 01/01/2021   BPPV (benign paroxysmal positional vertigo), left 12/26/2020    Odynophagia    Dysphagia 12/15/2020   Postural dizziness with presyncope 12/14/2020   Lumbar post-laminectomy syndrome    Chronic pain 09/28/2019   Lumbar spinal stenosis    Radicular pain    Gait disorder    Difficulty in urination 07/13/2018   Back pain 07/12/2018   Hyperlipidemia 04/16/2018   GAD (generalized anxiety disorder) 04/16/2018   AKI (acute kidney injury) (HCC)    Benign essential HTN    Major depression, recurrent (HCC)    Lumbar radiculopathy 03/09/2018   Paraparesis (HCC) 03/09/2018   Essential hypertension    Chronic nonintractable headache    Leukocytosis    Acute blood loss anemia    Postoperative pain    Generalized weakness    Spinal stenosis at L4-L5 level 03/04/2018   Spinal stenosis of lumbar region 12/28/2015   Chest pain 04/16/2015    ONSET DATE: Most recent Surgery date 12-07-23:  Referral date 02-03-24  REFERRING DIAG:  M54.16 (ICD-10-CM) - Lumbar radiculopathy M96.1 (ICD-10-CM) -  Lumbar post-laminectomy syndrome R26.9 (ICD-10-CM) - Gait disorder  THERAPY DIAG:  Muscle weakness (generalized)  Other abnormalities of gait and mobility  Rationale for Evaluation and Treatment: Rehabilitation  SUBJECTIVE:                                                                                                                                                                                             SUBJECTIVE STATEMENT: Pt reports she has not done exercises at home since Tuesday - 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 Hospitalized 12-07-23 - 12-11-23:  The patient was admitted on 12/25/2023 and taken to the operating room where the patient underwent irrigation and debridement of lumbar wound with primary closure and placement of external wound VAC- hospitalized 12-25-23 -  12-26-23  Frequent falls:  Spinal cord stimulator  insertion on 09-28-19; stimulator removal on 05-27-21 with resultant LLE weakness;  decompression and laminectomy on 12/28/15 at L4-L5  with redo decompression surgery on May 9,2019, post-op paraparesis with surgery to remove small hematoma; 07/12/18 fall at work (involved rolling chair) with hospital admission, transfer to CIR with d/c home 08/03/18; HTN, Bil foot surgery; left ventricular hypertrophy, chronic pain syndrome, dyspnea, hypertrophic cardiomyopathy; cervical fusion March 2023;  had carpal tunnel surgery RUE in April 2024:  LVH  PAIN:  Are you having pain? Pt rates pain 5/10 in lumbar region - greater pain in lower lumbar region near sacral area- same burning sensation Alleviating Factor:  placing pillow behind back when sitting Aggravating Factors: certain movements    PRECAUTIONS: Fall; No bending/lifting or twisting; pt states she has a reacher  WEIGHT BEARING RESTRICTIONS: No  FALLS: Has patient fallen in last 6 months? Yes. Number of falls 3  LIVING ENVIRONMENT: Lives with: lives with their spouse Lives in: House/apartment Stairs: Yes: External: 5 steps; on right going up Has following equipment at home: Single point cane, Wheelchair (manual), and L[place]strand crutch  PLOF: Independent with basic ADLs, Independent with household mobility with device, Independent with community mobility with device, and Independent with transfers  PATIENT GOALS: improve walking and strength Rt leg  OBJECTIVE:   DIAGNOSTIC FINDINGS: IMPRESSION: 1. Interval anterior discectomy and fusion from C4 through C6 with improved patency of the spinal canal and only mild residual foraminal narrowing. 2. Stable mild spinal stenosis and mild to moderate left foraminal narrowing at C3-4. 3. Stable mild spinal stenosis and mild foraminal narrowing bilaterally at C6-7. Mild foraminal narrowing bilaterally at C7-T1. 4. No cord deformity  or abnormal cord signal.  No acute findings. No recent MRI of lumbar  region  COGNITION: Overall cognitive status: Within functional limits for tasks assessed   SENSATION: WFL  COORDINATION: Decreased RLE due to weakness and spasticity  POSTURE: No Significant postural limitations  LOWER EXTREMITY ROM:   LLE WFL's;  RLE WFL's except for ankle ROM which is minimal due to weakness; Pt has difficulty performing Rt knee extension due to Rt quad weakness and spasticity - turned body slightly to Lt to achieve more knee extension   LOWER EXTREMITY MMT:    MMT Right Eval Left Eval  Hip flexion 3- 3+   Hip extension    Hip abduction    Hip adduction    Hip internal rotation    Hip external rotation    Knee flexion 3+   Knee extension 4- 4  Ankle dorsiflexion 2-   Ankle plantarflexion    Ankle inversion    Ankle eversion    (Blank rows = not tested)  BED MOBILITY:  Modified independent  TRANSFERS: Assistive device utilized: Rollator  Sit to stand: Modified independence Stand to sit: Modified independence   STAIRS:  Not tested due to c/o back pain   GAIT: Gait pattern: decreased step length- Right, decreased stance time- Right, decreased hip/knee flexion- Right, and decreased ankle dorsiflexion- Right Distance walked: 50' Assistive device utilized: with rollator Level of assistance: SBA and CGA Comments: slow gait speed due to c/o back pain and RLE weakness  FUNCTIONAL TESTS:  Sit to stand score - 2 reps - with bil. UE support from chair- 29.91 secs             TUG score: - 49.19 secs with rollator  Gait velocity:  37.38 secs = .88 ft/sec with rollator  PATIENT SURVEYS:  N/A - based on diagnosis of RLE weakness due to chronic lumbar radiculopathy   TODAY'S TREATMENT: 04-28-24                                                                                                                           Gait: Pt using rollator and wearing Blue rocker AFO on RLE - pt modified independent with use of rollator  Pt negotiated 4 steps x 1 rep  using Lt handrail - turned sideways - supervision    TherAct: Step ups to 6 step - 8 reps:  1st 6 reps good- incr. UE support on bars for reps 7 & 8  Step ups to 4 step RLE 10 reps;  Tap ups with RLE to 4 step - 10 reps - for Rt hip flexor strengthening Step downs from 4step - 6 reps:  5 reps; for Rt quad eccentric strengthening   Tap ups to 4 step LLE 10 reps, 5 reps for Rt isometric strengthening  Step down exercise with RLE from 4 step 5 reps for increased Rt quad eccentric strengthening  TherEx:  Scifit level 2.5 - bil. UE and LE's x 10;  ice pack applied to low back due to c/o burning sensation       UPDATED HEP: Access Code: [email or web address] URL: [email or web address] Date: 04/27/2024 Prepared by: Rock Kussmaul  Exercises - Seated Knee Extension with Resistance  - 1 x daily - 7 x weekly - 3 sets - 10 reps - TAP UPS - with Left leg   - 1 x daily - 7 x weekly - 3 sets - 10 reps - Standing March with Counter Support  - 1 x daily - 7 x weekly - 3 sets - 10 reps - Forward Step Up with Counter Support  - 1 x daily - 7 x weekly - 3 sets - 10 reps - Alternating Step Forward with Support  - 1 x daily - 7 x weekly - 3 sets - 10 reps   PATIENT EDUCATION: Education details:  Medbridge  Person educated: Patient Education method: Explanation - handout declined as pt stated she has it from previous OP admission Education comprehension: verbalized understanding  HOME EXERCISE PROGRAM:   GOALS: Goals reviewed with patient? Yes  LONG TERM GOALS: Target date: 04-15-24  Amb. 230' with RW with SBA without requiring seated rest period. Baseline: 134' x 1, 87' x 1 rep Goal status: Not met 04-19-24  2.  Improve TUG score to </= 28 secs  with use of RW for improved mobility and reduced fall risk. Baseline: 49.19 secs with rollator;  28.5 secs with rollator Goal status: Partially met 04-19-24  3.  Perform 5x sit to stand transfers with LUE support only to demo  improved strength in RLE.  Baseline: 36.16 secs - 4x with bil. UE support from chair - had to stop due to burning sensation in low back Goal status: Not met 04-19-24  4.  Modified independent step negotiation with use of bil. Hand rails using step by step sequence. Baseline:  Goal status: Goal met 04-19-24  5.  Increase gait velocity to >/= 1.6 ft/sec with rollator for increased gait efficiency with reduced fall risk.          Baseline: 37.38 secs = .88 ft/sec with rollator;  33.44 secs = .98 ft/sec with rollator          Goal status:  Not met 04-19-24   6.  Independent in updated HEP for RLE strengthening. Baseline:  Goal status:  Goal met 04-19-24   7.  Report reduced low back pain at rest to </= 3/10 intensity.            Baseline:  7/10;    04-20-24 -- 5/10            Goal status:  Not met - 04-19-24   UPDATED STG'S:  TARGET DATE 05-13-24  1.  Amb. 230' with RW with SBA without requiring seated rest period.             Baseline: 134' x 1, 87' x 1 rep on 04-19-24     Goal status:  Ongoing   2.  Improve TUG score to </= 26 secs  with use of RW for improved mobility and reduced fall risk. Baseline: 49.19 secs with rollator;  28.5 secs with rollator Goal status: Revised  3.  Perform 5x sit to stand from chair with bil. UE support in </= 30 secs to demonstrate improved mobility.     Baseline:  36.16 secs - 4 reps with bil. UE support from chair    Goal status:  NEW    4.  Pt will report  reduced pain in low back to </= 4/10 intensity for increased ease with ADL's and mobility.    Baseline:  5/10 on 04-19-24    Goal status:  Revised  UPDATED LONG TERM GOALS: Target date: 06-10-24   Amb. 350' with RW with SBA without requiring seated rest period. Baseline: 134' x 1, 87' x 1 rep - 04-19-24 Goal status: NEW  2.  Improve TUG score to </= 22 secs  with use of RW for improved mobility and reduced fall risk. Baseline: 49.19 secs with rollator;  28.5 secs with rollator - 04-19-24 Goal status:  NEW  3.  Perform 5x sit to stand transfers with LUE support only to demo improved strength in RLE IN </= 25 secs to demo improved functional mobility. Baseline: 36.16 secs - 4x with bil. UE support from chair - had to stop due to burning sensation in low back Goal status: NEW  4.  Increase gait velocity to >/= 1.6 ft/sec with rollator for increased gait efficiency with reduced fall risk.          Baseline: 37.38 secs = .88 ft/sec with rollator;  33.44 secs = .98 ft/sec with rollator          Goal status:  ONGOING   6.  Independent in updated HEP for RLE strengthening. Baseline:  Goal status:  ONGOING   7.  Report reduced low back pain at rest to </= 3/10 intensity.            Baseline:  7/10;    04-20-24 -- 5/10            Goal status:  ONGOING   ASSESSMENT:  CLINICAL IMPRESSION PT session focused on RLE strengthening in closed chain position.  Pt continues to have decreased muscle endurance in Rt quads.  Increased step height from 4 to 6 for step up exercise with pt performing 8 reps for Rt quad strengthening.  Pt's RLE became weak after 8 reps due to decreased muscle endurance with pt requiring seated rest period to prevent falling.  Pt reported increased burning in back after performing above exercises in standing position.  Ice pack alleviated discomfort and burning sensation in low back.  Cont with POC.   OBJECTIVE IMPAIRMENTS: Abnormal gait, decreased balance, decreased coordination, decreased endurance, decreased strength, and impaired tone.   ACTIVITY LIMITATIONS: carrying, bending, squatting, stairs, transfers, and locomotion level  PARTICIPATION LIMITATIONS: cleaning, laundry, shopping, and community activity  PERSONAL FACTORS: Behavior pattern, Past/current experiences, and Time since onset of injury/illness/exacerbation are also affecting patient's functional outcome.   REHAB POTENTIAL: Good  CLINICAL DECISION MAKING: Evolving/moderate complexity  EVALUATION  COMPLEXITY: Moderate  PLAN:  PT FREQUENCY: 2x/week  PT DURATION: 8 weeks  PLANNED INTERVENTIONS: Therapeutic exercises, Therapeutic activity, Neuromuscular re-education, Balance training, Gait training, Patient/Family education, Self Care, Stair training, Orthotic/Fit training, DME instructions, and Aquatic Therapy  PLAN FOR NEXT SESSION:   cont RLE strengthening; try step up exercise with 6 step; standing exercises 1st - then do LAQ's   [place]anna [place], PT 04/29/2024, 4:54 PM

## 2024-04-28 NOTE — Telephone Encounter (Signed)
 Called received from operator from pt reporting active chest pain.  Pt reports it started 2 to 3 weeks ago.  It is worse with laying down.  Last night she reports being in so much pain she became very hot and had to get up and sit on the sofa. Sitting up does help improve the discomfort.  She reports she didn't feel like she could talk the pain was so bad.  The pain at times goes down her left arm.  She denies any n/v or SOB with back pain/arm pain.  She does reports recent non productive cough - like her throat is dry.  Coughing a lot per her report.  Reports pain is more intense with movement of her arms and with a deep breath.  She has been going to PT for muscle strengthen after 2 back surgeries in February of this year. No current pain.  No HX of CAD, does have HX hypertrophic cardiomyopathy.  Pt does not have SL Ntg to try at home.  Advised I will forward this information to Dr Mona for review.  Also advised she can try using ice alternating with heat to the area of discomfort and to take Tylenol  as ordered prn.  She is aware to call 911 and report to closest ED for further evaluation if s/s return or worsen.

## 2024-04-29 ENCOUNTER — Encounter: Payer: Self-pay | Admitting: Physical Therapy

## 2024-05-02 ENCOUNTER — Other Ambulatory Visit: Payer: Self-pay

## 2024-05-02 NOTE — Patient Instructions (Signed)
 Visit Information  Thank you for taking time to visit with me today. Please don't hesitate to contact me if I can be of assistance to you before our next scheduled telephone appointment.  Our next appointment is by telephone on 05/17/24 at 9am  Following is a copy of your care plan:   Goals Addressed             This Visit's Progress    VBCI RN Care Plan       Problems:  Chronic Disease Management support and education needs related to Back Pain  Goal: Over the next 5 months the Patient will demonstrate Improved adherence to prescribed treatment plan for back pain as evidenced by attending outpatient PT as ordered, rate back pain of 5 or less. 02/26/24 televisit w/ RNCM, pain remains higher than desired, at 7 out of pain scale 0-10. On 5/16 televisit w/ RNCM, pain level was much improved at and at desired level of <5, rating pain at 4 out of pain scale 0-10. On 7/7 televisit w/ RNCM, pain level rated at 5-6, has follow up with Dr. Babs planned for 7/9 to further address post surgical back pain.  Interventions:   Pain Interventions: Pain assessment performed Medications reviewed Reviewed provider established plan for pain management Discussed importance of adherence to all scheduled medical appointments Counseled on the importance of reporting any/all new or changed pain symptoms or management strategies to pain management provider Discussed use of relaxation techniques and/or diversional activities to assist with pain reduction (distraction, imagery, relaxation, massage, acupressure, TENS, heat, and cold application Reviewed with patient prescribed pharmacological and nonpharmacological pain relief strategies Screening for signs and symptoms of depression related to chronic disease state  Assessed social determinant of health barriers  Patient Self-Care Activities:  Attend all scheduled provider appointments Call pharmacy for medication refills 3-7 days in advance of running out of  medications Call provider office for new concerns or questions  Take medications as prescribed    Plan:  Telephone follow up appointment with care management team member scheduled for:  Tuesday, July 22 at 9:00am.          VBCI RN Care Plan       Problems:  Chronic Disease Management support and education needs related to Frequent Falls   Goal: Over the next 5 months the Patient will demonstrate ongoing self health care management ability to avoid injury as evidenced by    no falls.   Interventions:   Falls Interventions: Provided written and verbal education re: potential causes of falls and Fall prevention strategies Reviewed medications and discussed potential side effects of medications such as dizziness and frequent urination Assessed for signs and symptoms of orthostatic hypotension  Patient Self-Care Activities:  Take extra care when getting out of bed, sit on the bed for a few minutes to allow blood pressure to stabilize before ambulating. Perform same caution when going from sitting to standing.  Use hand rails when possible, be aware of surroundings when walking.   Plan:  The patient has been provided with contact information for the care management team and has been advised to call with any health related questions or concerns.  Follow up appointment with RNCM changed to 05/17/24 @ 9:00 am              Patient verbalizes understanding of instructions and care plan provided today and agrees to view in MyChart. Active MyChart status and patient understanding of how to access instructions and care plan via  MyChart confirmed with patient.     The patient has been provided with contact information for the care management team and has been advised to call with any health related questions or concerns.   Please call the care guide team at [phone] if you need to cancel or reschedule your appointment.   Please call 1-800-273-TALK (toll free, 24 hour hotline) if  you are experiencing a Mental Health or Behavioral Health Crisis or need someone to talk to.   Amarri Satterly A. Gordy RN, BA, [facility], CRRN [place]  [facility] Population Health RN Care Manager Direct Dial: [phone]  Fax: [phone]

## 2024-05-02 NOTE — Patient Outreach (Signed)
 Complex Care Management   Visit Note  05/02/2024  Name:  [name] MRN: [id number] DOB: [birth year]  Situation: Referral received for Complex Care Management related to Chronic Back Pain, Lumbar Post-Laminectomy Syndrome, and High Risk for Falls d/t history of falls. I obtained verbal consent from Patient.  Visit completed with patient   on the phone  Background:   Past Medical History:  Diagnosis Date   Anxiety    Aortic atherosclerosis (HCC) 04/16/2021   Atypical angina (HCC)    Back pain    related to spinal stenosis and disc problem, radiates down left buttocks to leg., weakness occ.   Chest pain    a. 03/2015 Cath: nl cors; b. 03/2021 Cor CTA: Ca2+ = 0. Nl Cors.   Chronic pain syndrome    Dyspnea    hx   GERD (gastroesophageal reflux disease)    Grade I diastolic dysfunction    Headache    Hyperlipidemia    Hypertension    Lumbar post-laminectomy syndrome    LVH (left ventricular hypertrophy) 12/15/2020   a. 11/2020 Echo: EF 65-70%, no rwma, sev asymm LVH with IVSd 1.9 cm. No LVOT obs @ rest. Gr1 DD. Triv MR.   PONV (postoperative nausea and vomiting)    Pulmonary nodules    a. 03/2021 CT Chest: 3mm pulm nodules in bilat lower lobes. F/u 1 yr.   Right foot drop    Syncope    a. 11/2020 Zio: No significant arrhythmias.   Vaginal foreign object    Uses Femring     Assessment: Patient Reported Symptoms:  Cognitive Cognitive Status: Alert and oriented to person, place, and time, Normal speech and language skills, Insightful and able to interpret abstract concepts Cognitive/Intellectual Conditions Management [RPT]: None reported or documented in medical history or problem list   Health Maintenance Behaviors: Exercise, Social activities, Sleep adequate, Annual physical exam Healing Pattern: Slow (acknowledges that she has been told that it will take some time for her back to heal from surgery) Health Facilitated by: Pain control, Prayer/meditation, Rest,  Healthy diet  Neurological Neurological Review of Symptoms: No symptoms reported    HEENT HEENT Symptoms Reported: No symptoms reported      Cardiovascular Cardiovascular Symptoms Reported: Chest pain or discomfort (had a documented episode of CP 04/28/24 & called Cardiology, spoke to Triage RN who referred patient to nearest ED for follow up-pt stated pain abated & she decided not to go to the [facility] ED (too busy)-encouraged pt to consider [facility] in future) Does patient have uncontrolled Hypertension?: No Is patient checking Blood Pressure at home?: Yes Patient's Recent BP reading at home: 130's/70s-80s Cardiovascular Management Strategies: Medication therapy, Adequate rest, Coping strategies Weight: 162 lb (73.5 kg) (states she has lost a little weight due to eating less) Cardiovascular Self-Management Outcome: 3 (uncertain) Cardiovascular Comment: back to baseline after Chest Pain episode on 04/28/24  Respiratory Respiratory Symptoms Reported: No symptoms reported    Endocrine Endocrine Symptoms Reported: No symptoms reported Is patient diabetic?: No    Gastrointestinal Gastrointestinal Symptoms Reported: No symptoms reported      Genitourinary Genitourinary Symptoms Reported: No symptoms reported    Integumentary Integumentary Symptoms Reported: No symptoms reported    Musculoskeletal Musculoskelatal Symptoms Reviewed: Unsteady gait, Back pain, Muscle pain Additional Musculoskeletal Details: essentially no change in symptoms from last engagement, slowly recovering from lumbar post-laminectomy syndrome Musculoskeletal Management Strategies: Medical device, Medication therapy, Coping strategies, Exercise, Activity, Adequate rest, Routine screening Musculoskeletal Self-Management Outcome: 4 (good) Musculoskeletal Comment: continues  to see Physical Therapist Elvie for Outpatient PT twice weekly Falls in the past year?: Yes Number of falls in past year: 2 or more Was there an  injury with Fall?: Yes Fall Risk Category Calculator: 3 Patient Fall Risk Level: High Fall Risk Patient at Risk for Falls Due to: History of fall(s), Impaired balance/gait, Impaired mobility, Medication side effect, Orthopedic patient Fall risk Follow up: Falls evaluation completed, Education provided, Falls prevention discussed, Follow up appointment  Psychosocial Psychosocial Symptoms Reported: No symptoms reported   Major Change/Loss/Stressor/Fears (CP): Medical condition, self Quality of Family Relationships: helpful, involved, supportive Do you feel physically threatened by others?: No      02/11/2024    9:57 AM  Depression screen PHQ 2/9  Decreased Interest 0  Down, Depressed, Hopeless 0  PHQ - 2 Score 0    There were no vitals filed for this visit.  Medications Reviewed Today     Reviewed by Gordy Channing LABOR, RN (Registered Nurse) on 05/02/24 at [phone]  Med List Status: <None>   Medication Order Taking? Sig Documenting Provider Last Dose Status Informant  acetaminophen  (TYLENOL ) 500 MG tablet [id number] Yes Take 500 mg by mouth every 6 (six) hours as needed for headache. [provider]  Active Self  ALPRAZolam  (XANAX ) 0.5 MG tablet [id number] Yes Take 1/2 - 1 tablet by mouth at bedtime as needed for anxiety. [place], [name], MD  Active   amitriptyline  (ELAVIL ) 25 MG tablet [id number] Yes Take 1 tablet (25 mg total) by mouth daily.   Active Self  amLODipine  (NORVASC ) 5 MG tablet [id number] Yes Take 1 tablet (5 mg total) by mouth daily. Mona Vinie BROCKS, MD  Active Self  atorvastatin  (LIPITOR) 20 MG tablet [id number] Yes Take 1 tablet (20 mg total) by mouth daily. [place], [name], MD  Active Self  baclofen  (LIORESAL ) 10 MG tablet [id number] Yes Take 1 tablet (10 mg total) by mouth 3 (three) times daily as needed. Babs Arthea DASEN, MD  Active Self           Med Note JERALYN, [place] A   Mon Nov 30, 2023  1:44 PM) Pt alternates takes baclofen  and methocarbamol    betamethasone   dipropionate 0.05 % cream [id number] Yes Apply as directed to skin once a day to itchy areas only avoiding normal skin as needed   Active   carvedilol  (COREG ) 25 MG tablet [id number] Yes Take 1 tablet (25 mg total) by mouth 2 (two) times daily with a meal. Vannie Reche RAMAN, NP  Active   cholecalciferol  (VITAMIN D3) 25 MCG (1000 UNIT) tablet [id number] Yes Take 1,000 Units by mouth daily. [provider]  Active Self  cromolyn  (OPTICROM ) 4 % ophthalmic solution [id number] Yes Place 1-2 drops into both eyes 4 (four) times daily as needed. [place], [name], MD  Active   diphenhydrAMINE  (BENADRYL ) 25 MG tablet [id number] Yes Take 25 mg by mouth daily as needed for itching. [provider]  Active Self  docusate sodium  (COLACE) 100 MG capsule [id number] Yes Take 100 mg by mouth 2 (two) times daily. [provider]  Active   DULoxetine  (CYMBALTA ) 30 MG capsule [id number] Yes Take 2 capsules (60 mg total) by mouth daily. Babs Arthea DASEN, MD  Active Self           Med Note (ROSE, ALAN RAYMOND Kitchens Dec 14, 2023  9:30 AM) Patient is taking 30 mg once a day.   Estradiol  Acetate (FEMRING ) 0.05 MG/24HR RING [id number] Yes USE ONE  RING VAGINALLY EVERY 3 MONTHS AS DIRECTED   Active   fluconazole  (DIFLUCAN ) 200 MG tablet [id number] Yes Take 1 tablet by mouth once a week   Active Self  gabapentin  (NEURONTIN ) 600 MG tablet [id number] Yes Take 1 tablet (600 mg total) by mouth 3 (three) times daily. Babs Arthea DASEN, MD  Active Self  hydrALAZINE  (APRESOLINE ) 50 MG tablet [id number]  Take 1 tablet (50 mg total) by mouth 3 (three) times daily.  Patient taking differently: Take 50 mg by mouth 2 (two) times daily.   West, Katlyn D, NP  Expired 04/13/24 2359 Self           Med Note (ROSE, AMANDA U   Mon Dec 14, 2023  9:31 AM) Reports taking 2 times per day    Discontinued 04/21/24 1712 (Allergic reaction)            Med Note>> Gordy Channing LABOR, RN   02/26/2024  9:13 AM Itching, Hives, Breathing Problems     methocarbamol  (ROBAXIN ) 500 MG tablet [id number] Yes Take 1 tablet (500 mg total) by mouth 3 (three) times daily as needed for muscle spasm. Babs Arthea DASEN, MD  Active   mometasone  (NASONEX ) 50 MCG/ACT nasal spray [id number] Yes Place 2 sprays into the nose daily. [place], [name], MD  Active     Discontinued 09/05/23 1325 (Patient Preference)            Med Note>> Sheela Duwaine CROME, CPhT   09/05/2023  1:25 PM non longer taking    ondansetron  (ZOFRAN ) 4 MG tablet [id number] Yes Take 1 tablet (4 mg tablet) 30 - 60 minutes each prep dose Legrand Victory CROME DOUGLAS, MD  Active Self           Med Note (ROSE, AMANDA U   Wed Jan 27, 2024 11:11 AM) Old RX.  No refills.  pregabalin  (LYRICA ) 300 MG capsule [id number]  Take 1 capsule (300 mg total) by mouth 2 (two) times daily. Gregg Lek, MD  Expired 04/13/24 2359 Self  spironolactone  (ALDACTONE ) 50 MG tablet [id number] Yes Take 1 tablet (50 mg total) by mouth daily. Mona Vinie BROCKS, MD  Active   tapentadol  (NUCYNTA ) 50 MG tablet [id number] Yes Take 1 tablet (50 mg total) by mouth every 6 (six) to 8 (eight) hours as needed.   Active            Med Note CHANETTA, Harding Thomure A   Fri Feb 26, 2024  9:10 AM) Did obtain prior auth  terbinafine (LAMISIL) 1 % cream [id number] Yes Apply 1 Application topically 2 (two) times daily as needed (rash). [provider]  Active Self            Recommendation:   Specialty provider follow-up Dr. Babs on 05/04/24  Follow Up Plan:   Telephone follow up appointment date/time:  05/17/24 @ 9am with RN Care Manager Channing CROME Channing A. Gordy RN, BA, [facility], CRRN [facility] RN Care Manager Direct Dial: [phone]  Fax: [phone]

## 2024-05-03 ENCOUNTER — Ambulatory Visit: Admitting: Physical Therapy

## 2024-05-04 ENCOUNTER — Other Ambulatory Visit: Payer: Self-pay

## 2024-05-04 ENCOUNTER — Other Ambulatory Visit (HOSPITAL_COMMUNITY): Payer: Self-pay

## 2024-05-04 ENCOUNTER — Encounter: Attending: Physical Medicine & Rehabilitation | Admitting: Physical Medicine & Rehabilitation

## 2024-05-04 ENCOUNTER — Encounter: Payer: Self-pay | Admitting: Physical Medicine & Rehabilitation

## 2024-05-04 VITALS — BP 131/88 | HR 90 | Ht 65.0 in

## 2024-05-04 DIAGNOSIS — M5416 Radiculopathy, lumbar region: Secondary | ICD-10-CM | POA: Insufficient documentation

## 2024-05-04 DIAGNOSIS — M961 Postlaminectomy syndrome, not elsewhere classified: Secondary | ICD-10-CM | POA: Diagnosis not present

## 2024-05-04 DIAGNOSIS — M21371 Foot drop, right foot: Secondary | ICD-10-CM | POA: Insufficient documentation

## 2024-05-04 DIAGNOSIS — M4722 Other spondylosis with radiculopathy, cervical region: Secondary | ICD-10-CM | POA: Insufficient documentation

## 2024-05-04 MED ORDER — TAPENTADOL HCL 50 MG PO TABS
50.0000 mg | ORAL_TABLET | Freq: Four times a day (QID) | ORAL | 0 refills | Status: DC | PRN
  Filled 2024-05-04: qty 60, 15d supply, fill #0

## 2024-05-04 NOTE — Patient Instructions (Addendum)
 ALWAYS FEEL FREE TO CALL OUR OFFICE WITH ANY PROBLEMS OR QUESTIONS [phone])  **PLEASE NOTE** ALL MEDICATION REFILL REQUESTS (INCLUDING CONTROLLED SUBSTANCES) NEED TO BE MADE AT LEAST 7 DAYS PRIOR TO REFILL BEING DUE. ANY REFILL REQUESTS INSIDE THAT TIME FRAME MAY RESULT IN DELAYS IN RECEIVING YOUR PRESCRIPTION.    CTS WRIST SPLINT TALK TO SUZANNE ABOUT SOME WAYS TO AVOID EXCESSIVE WRIST EXTENSION AND FLEXION WHILE USING YOUR WALKER    USE NUCYNTA  FOR PAIN. TAKE BEFORE THERAPIES OR ACTIVITY STOP TRAMADOL  .  STAY ACTIVE!!

## 2024-05-04 NOTE — Progress Notes (Signed)
 Subjective:    Patient ID: [name], female    DOB: 01-15-1961, 64 y.o.   MRN: [id number]  HPI  [name] is here in follow up of  her chronic back pain and gait disorder. She is frustrated about ongoing pain in her back in LE's. Outpatient therapy is ongoing addressing gait and LE strength , using parallel bars, stairs, bike, etc.   She also has had recurrence of her right CTS symptoms. She was referred to ortho hand apparently and injections have helped somewhat.   For pain she is on elavil , gabapentin , lyrica  and cymbalta  as well as baclofen , robaxin . Dr. Joshua wrote nucynta  which she uses at most once per day. Pain is in her low back and legs. It's worse with activity and in the morning when she first gets up.   She is still uisng a rolling walker for balance and right AFO for support of her right ankle.   Pain Inventory Average Pain 5 Pain Right Now 5 My pain is burning and tingling  In the last 24 hours, has pain interfered with the following? General activity 5 Relation with others 5 Enjoyment of life 5 What TIME of day is your pain at its worst? daytime and night Sleep (in general) Fair  Pain is worse with: sitting, standing, some activites, and   Pain improves with: heat/ice and therapy/exercise Relief from Meds: 7  Family History  Problem Relation Age of Onset   Heart attack Mother    Lung cancer Father    Cancer Father    Pancreatic cancer Sister    Breast cancer Sister 76   Multiple myeloma Sister    Breast cancer Sister        diagnosed in her 73's   Heart attack Sister    Throat cancer Brother    Lung cancer Brother    Stomach cancer Cousin    Colon cancer Neg Hx    Colon polyps Neg Hx    Esophageal cancer Neg Hx    Rectal cancer Neg Hx    Social History   Socioeconomic History   Marital status: Married    Spouse name: Soil scientist   Number of children: 1   Years of education: Not on file   Highest education level: Not on  file  Occupational History   Not on file  Tobacco Use   Smoking status: Never   Smokeless tobacco: Never  Vaping Use   Vaping status: Never Used  Substance and Sexual Activity   Alcohol use: Never   Drug use: Never   Sexual activity: Yes    Partners: Male    Birth control/protection: Post-menopausal, Surgical    Comment: Hysterectomy  Other Topics Concern   Not on file  Social History Narrative   Not on file   Social Drivers of Health   Financial Resource Strain: Low Risk  (05/02/2024)   Overall Financial Resource Strain (CARDIA)    Difficulty of Paying Living Expenses: Not very hard  Food Insecurity: No Food Insecurity (05/02/2024)   Hunger Vital Sign    Worried About Running Out of Food in the Last Year: Never true    Ran Out of Food in the Last Year: Never true  Transportation Needs: No Transportation Needs (05/02/2024)   PRAPARE - Administrator, Civil Service (Medical): No    Lack of Transportation (Non-Medical): No  Physical Activity: Insufficiently Active (05/02/2024)   Exercise Vital Sign    Days of Exercise per  Week: 2 days    Minutes of Exercise per Session: 30 min  Stress: No Stress Concern Present (10/02/2023)   [employer] of Occupational Health - Occupational Stress Questionnaire    Feeling of Stress : Not at all  Social Connections: Socially Integrated (05/02/2024)   Social Connection and Isolation Panel    Frequency of Communication with Friends and Family: More than three times a week    Frequency of Social Gatherings with Friends and Family: More than three times a week    Attends Religious Services: More than 4 times per year    Active Member of [employer] or Organizations: Yes    Attends Engineer, structural: More than 4 times per year    Marital Status: Married   Past Surgical History:  Procedure Laterality Date   ABDOMINAL HYSTERECTOMY     ANTERIOR CERVICAL DECOMP/DISCECTOMY FUSION N/A 01/03/2022   Procedure: Anterior Cervical  Decompression Fusion - Cervical four-Cervical five - Cervical five-Cervical six;  Surgeon: Joshua Alm RAMAN, MD;  Location: [facility] OR;  Service: Neurosurgery;  Laterality: N/A;   BIOPSY  12/16/2020   Procedure: BIOPSY;  Surgeon: Aneita Gwendlyn DASEN, MD;  Location: [facility] ENDOSCOPY;  Service: Endoscopy;;   CARDIAC CATHETERIZATION N/A 04/18/2015   Procedure: Left Heart Cath and Coronary Angiography;  Surgeon: Rober Chroman, MD;  Location: [facility] INVASIVE CV LAB;  Service: Cardiovascular;  Laterality: N/A;   COLONOSCOPY     COLONOSCOPY W/ BIOPSIES AND POLYPECTOMY  2018   ESOPHAGOGASTRODUODENOSCOPY (EGD) WITH PROPOFOL  N/A 12/16/2020   Procedure: ESOPHAGOGASTRODUODENOSCOPY (EGD) WITH PROPOFOL ;  Surgeon: Aneita Gwendlyn DASEN, MD;  Location: [facility] ENDOSCOPY;  Service: Endoscopy;  Laterality: N/A;   FOOT SURGERY Bilateral    [facility] bunion,bone spur, tendon (1) -6'16, (1)-10'16   HEMATOMA EVACUATION N/A 01/05/2022   Procedure: Cervical Wound Exploration;  Surgeon: Gillie Duncans, MD;  Location: [facility] OR;  Service: Neurosurgery;  Laterality: N/A;   IR EPIDUROGRAPHY  07/21/2018   LUMBAR LAMINECTOMY/DECOMPRESSION MICRODISCECTOMY Bilateral 12/28/2015   Procedure: MICRO LUMBAR DECOMPRESSION L4 - L5 BILATERALLY;  Surgeon: Reyes Billing, MD;  Location: [facility] ORS;  Service: Orthopedics;  Laterality: Bilateral;   LUMBAR LAMINECTOMY/DECOMPRESSION MICRODISCECTOMY Bilateral 03/04/2018   Procedure: Revision of Microlumbar Decompression Bilateral Lumbar Four-Five;  Surgeon: Billing Reyes, MD;  Location: [facility] OR;  Service: Orthopedics;  Laterality: Bilateral;  90 mins   LUMBAR WOUND DEBRIDEMENT N/A 12/25/2023   Procedure: Irrigation and debridement of lumbar wound;  Surgeon: Joshua Alm Hamilton, MD;  Location: [facility] OR;  Service: Neurosurgery;  Laterality: N/A;   POSTERIOR FUSION PEDICLE SCREW PLACEMENT N/A 12/07/2023   Procedure: LUMBAR FOUR-FIVE POSTERIOR LATERAL FUSION;  Surgeon: Joshua Alm Hamilton, MD;  Location: [facility] OR;  Service:  Neurosurgery;  Laterality: N/A;   SAVORY DILATION N/A 12/16/2020   Procedure: SAVORY DILATION;  Surgeon: Aneita Gwendlyn DASEN, MD;  Location: [facility] ENDOSCOPY;  Service: Endoscopy;  Laterality: N/A;   SPINAL CORD STIMULATOR INSERTION N/A 09/28/2019   Procedure: THORACIC SPINAL CORD STIMULATOR INSERTION;  Surgeon: Burnetta Aures, MD;  Location: [facility] OR;  Service: Orthopedics;  Laterality: N/A;  2.5 hrs   SPINAL CORD STIMULATOR REMOVAL N/A 05/27/2021   Procedure: LUMBAR SPINAL CORD STIMULATOR REMOVAL;  Surgeon: Bluford Standing, MD;  Location: [facility] ORS;  Service: Neurosurgery;  Laterality: N/A;   TUBAL LIGATION     WISDOM TOOTH EXTRACTION     WOUND EXPLORATION N/A 03/04/2018   Procedure: EXPLORATION OF LUMBAR DECOMPRESSION WOUND;  Surgeon: Billing Reyes, MD;  Location: [facility] OR;  Service: Orthopedics;  Laterality: N/A;  Past Surgical History:  Procedure Laterality Date   ABDOMINAL HYSTERECTOMY     ANTERIOR CERVICAL DECOMP/DISCECTOMY FUSION N/A 01/03/2022   Procedure: Anterior Cervical Decompression Fusion - Cervical four-Cervical five - Cervical five-Cervical six;  Surgeon: Joshua Alm RAMAN, MD;  Location: [facility] OR;  Service: Neurosurgery;  Laterality: N/A;   BIOPSY  12/16/2020   Procedure: BIOPSY;  Surgeon: Aneita Gwendlyn DASEN, MD;  Location: [facility] ENDOSCOPY;  Service: Endoscopy;;   CARDIAC CATHETERIZATION N/A 04/18/2015   Procedure: Left Heart Cath and Coronary Angiography;  Surgeon: Rober Chroman, MD;  Location: [facility] INVASIVE CV LAB;  Service: Cardiovascular;  Laterality: N/A;   COLONOSCOPY     COLONOSCOPY W/ BIOPSIES AND POLYPECTOMY  2018   ESOPHAGOGASTRODUODENOSCOPY (EGD) WITH PROPOFOL  N/A 12/16/2020   Procedure: ESOPHAGOGASTRODUODENOSCOPY (EGD) WITH PROPOFOL ;  Surgeon: Aneita Gwendlyn DASEN, MD;  Location: [facility] ENDOSCOPY;  Service: Endoscopy;  Laterality: N/A;   FOOT SURGERY Bilateral    [facility] bunion,bone spur, tendon (1) -6'16, (1)-10'16   HEMATOMA EVACUATION N/A 01/05/2022   Procedure: Cervical Wound  Exploration;  Surgeon: Gillie Duncans, MD;  Location[facility] OR;  Service: Neurosurgery;  Laterality: N/A;   IR EPIDUROGRAPHY  07/21/2018   LUMBAR LAMINECTOMY/DECOMPRESSION MICRODISCECTOMY Bilateral 12/28/2015   Procedure: MICRO LUMBAR DECOMPRESSION L4 - L5 BILATERALLY;  Surgeon: Reyes Billing, MD;  Location: [facility] ORS;  Service: Orthopedics;  Laterality: Bilateral;   LUMBAR LAMINECTOMY/DECOMPRESSION MICRODISCECTOMY Bilateral 03/04/2018   Procedure: Revision of Microlumbar Decompression Bilateral Lumbar Four-Five;  Surgeon: Billing Reyes, MD;  Location: [facility] OR;  Service: Orthopedics;  Laterality: Bilateral;  90 mins   LUMBAR WOUND DEBRIDEMENT N/A 12/25/2023   Procedure: Irrigation and debridement of lumbar wound;  Surgeon: Joshua Alm Hamilton, MD;  Location: [facility] OR;  Service: Neurosurgery;  Laterality: N/A;   POSTERIOR FUSION PEDICLE SCREW PLACEMENT N/A 12/07/2023   Procedure: LUMBAR FOUR-FIVE POSTERIOR LATERAL FUSION;  Surgeon: Joshua Alm Hamilton, MD;  Location: [facility] OR;  Service: Neurosurgery;  Laterality: N/A;   SAVORY DILATION N/A 12/16/2020   Procedure: SAVORY DILATION;  Surgeon: Aneita Gwendlyn DASEN, MD;  Location: [facility] ENDOSCOPY;  Service: Endoscopy;  Laterality: N/A;   SPINAL CORD STIMULATOR INSERTION N/A 09/28/2019   Procedure: THORACIC SPINAL CORD STIMULATOR INSERTION;  Surgeon: Burnetta Aures, MD;  Location: [facility] OR;  Service: Orthopedics;  Laterality: N/A;  2.5 hrs   SPINAL CORD STIMULATOR REMOVAL N/A 05/27/2021   Procedure: LUMBAR SPINAL CORD STIMULATOR REMOVAL;  Surgeon: Bluford Standing, MD;  Location: [facility] ORS;  Service: Neurosurgery;  Laterality: N/A;   TUBAL LIGATION     WISDOM TOOTH EXTRACTION     WOUND EXPLORATION N/A 03/04/2018   Procedure: EXPLORATION OF LUMBAR DECOMPRESSION WOUND;  Surgeon: Billing Reyes, MD;  Location: [facility] OR;  Service: Orthopedics;  Laterality: N/A;   Past Medical History:  Diagnosis Date   Anxiety    Aortic atherosclerosis (HCC) 04/16/2021   Atypical angina (HCC)    Back  pain    related to spinal stenosis and disc problem, radiates down left buttocks to leg., weakness occ.   Chest pain    a. 03/2015 Cath: nl cors; b. 03/2021 Cor CTA: Ca2+ = 0. Nl Cors.   Chronic pain syndrome    Dyspnea    hx   GERD (gastroesophageal reflux disease)    Grade I diastolic dysfunction    Headache    Hyperlipidemia    Hypertension    Lumbar post-laminectomy syndrome    LVH (left ventricular hypertrophy) 12/15/2020   a. 11/2020 Echo: EF 65-70%, no rwma, sev asymm LVH  with IVSd 1.9 cm. No LVOT obs @ rest. Gr1 DD. Triv MR.   PONV (postoperative nausea and vomiting)    Pulmonary nodules    a. 03/2021 CT Chest: 3mm pulm nodules in bilat lower lobes. F/u 1 yr.   Right foot drop    Syncope    a. 11/2020 Zio: No significant arrhythmias.   Vaginal foreign object    Uses Femring    BP 131/88 (BP Location: Left Arm, Patient Position: Sitting, Cuff Size: Normal)   Pulse 90   Ht 5' 5 (1.651 m)   SpO2 99%   BMI 26.96 kg/m   Opioid Risk Score:   Fall Risk Score:  `1  Depression screen [facility] 2/9     05/04/2024   10:59 AM 02/11/2024    9:57 AM 02/03/2024   10:12 AM 10/14/2023    9:19 AM 10/02/2023   11:05 AM 09/30/2023   11:18 AM 07/29/2023    9:50 AM  Depression screen PHQ 2/9  Decreased Interest 0 0 0 0 0 0 0  Down, Depressed, Hopeless 0 0 0 0 0 0 0  PHQ - 2 Score 0 0 0 0 0 0 0  Altered sleeping    0     Tired, decreased energy    0     Change in appetite    0     Feeling bad or failure about yourself     0     Trouble concentrating    0     Moving slowly or fidgety/restless    0     Suicidal thoughts    0     PHQ-9 Score    0     Difficult doing work/chores    Not difficult at all         Review of Systems  Musculoskeletal:  Positive for back pain and myalgias.       Lower back radiating down both legs, right carpal tunnel pain, left rigger finger pain  All other systems reviewed and are negative.      Objective:   Physical Exam General: No acute  distress HEENT: NCAT, EOMI, oral membranes moist Cards: reg rate  Chest: normal effort Abdomen: Soft, NT, ND Skin: dry, intact Extremities: no edema Psych: pleasant and appropriate  Skin: intact.   Neuro: RLE is 3-/5 KE, tr-1 at ankle with DF/PF --> blue rocker AFO fitting appropriately. SABRA LLE 4/5 grossly.  PERSISTENT  sensory loss distally in both legs per baseline upper extremities are 4+ to 5 out of 5 on the right and  4 out of 5 on the left with some pain inhibition.  Struggles more with left leg swing phase of gait.      Musculoskeletal: LB tender to palp.    Assessment & Plan:  1. Post Lumbar Laminectomy / Decompression: Lumbar Radiculopathy:             -gabapentin  continue at 300mg  tid and Lyrica  from [place] [facility]             -nucynta --will assume rx--Q6 prn #60, 0 RF--use before therapies and activities.  Told her that increasing her activity is paramount for improvement of pain and mobility   -CSA at next visit  -do not resume tramadol              - Cymbalta   60 mg daily or not at all. She will resume -blue rocker AFO  for support of RLE during stance and gait -ongoing outpt PT for gait  training and RLE strengthening, neuro rehab [address] 2. left BPPV-improved             -discussed being aware of safety, intake, etc             -acclimation 3. Anxiety. PCP Following and prescribing Alprazolam .               -buspar , xanax  prn per primary 4. Spasms: may continue robaxin  or baclofen , prefer baclofen  5. Orthotics:             -continue AFO as above. Blue rocker is fitting well 6.  Cervical spondylosis with radiculopath at C6.                       -improved after C4-5, C5-6 ACDF 7. Right hand pain: + CTS,  -recurrent sx -discussed adaptive techniques for right wrist.   Start using right splint               -moderate 1st carpo metarcarpal OA       Twenty minutes of face to face patient care time were spent during this visit. All questions were encouraged and  answered.  Follow up with me in 3 mos .

## 2024-05-09 ENCOUNTER — Other Ambulatory Visit (HOSPITAL_COMMUNITY): Payer: Self-pay

## 2024-05-12 ENCOUNTER — Ambulatory Visit: Payer: Self-pay | Admitting: Physical Therapy

## 2024-05-12 DIAGNOSIS — M6281 Muscle weakness (generalized): Secondary | ICD-10-CM

## 2024-05-12 DIAGNOSIS — R2689 Other abnormalities of gait and mobility: Secondary | ICD-10-CM

## 2024-05-12 NOTE — Therapy (Unsigned)
 OUTPATIENT PHYSICAL THERAPY NEURO TREATMENT NOTE     Patient Name: [name] MRN: [id number] DOB:[birth year], 63 y.o., female Today's Date: 05/13/2024   PCP: [place], [name]., MD REFERRING PROVIDER: Babs Arthea DASEN, MD  END OF SESSION:  PT End of Session - 05/13/24 1707     Visit Number 15    Number of Visits 28    Date for PT Re-Evaluation 06/10/24    Authorization Type [employer]    Authorization Time Period 02-16-24 -04-25-24:  04-19-24 - 06-26-24    Progress Note Due on Visit 20    PT Start Time 0931    PT Stop Time 1016    PT Time Calculation (min) 45 min    Equipment Utilized During Treatment Gait belt    Activity Tolerance Patient tolerated treatment well    Behavior During Therapy [facility] for tasks assessed/performed                        Past Medical History:  Diagnosis Date   Anxiety    Aortic atherosclerosis (HCC) 04/16/2021   Atypical angina (HCC)    Back pain    related to spinal stenosis and disc problem, radiates down left buttocks to leg., weakness occ.   Chest pain    a. 03/2015 Cath: nl cors; b. 03/2021 Cor CTA: Ca2+ = 0. Nl Cors.   Chronic pain syndrome    Dyspnea    hx   GERD (gastroesophageal reflux disease)    Grade I diastolic dysfunction    Headache    Hyperlipidemia    Hypertension    Lumbar post-laminectomy syndrome    LVH (left ventricular hypertrophy) 12/15/2020   a. 11/2020 Echo: EF 65-70%, no rwma, sev asymm LVH with IVSd 1.9 cm. No LVOT obs @ rest. Gr1 DD. Triv MR.   PONV (postoperative nausea and vomiting)    Pulmonary nodules    a. 03/2021 CT Chest: 3mm pulm nodules in bilat lower lobes. F/u 1 yr.   Right foot drop    Syncope    a. 11/2020 Zio: No significant arrhythmias.   Vaginal foreign object    Uses Femring    Past Surgical History:  Procedure Laterality Date   ABDOMINAL HYSTERECTOMY     ANTERIOR CERVICAL DECOMP/DISCECTOMY FUSION N/A 01/03/2022   Procedure: Anterior Cervical Decompression  Fusion - Cervical four-Cervical five - Cervical five-Cervical six;  Surgeon: Joshua Alm RAMAN, MD;  Location: [facility] OR;  Service: Neurosurgery;  Laterality: N/A;   BIOPSY  12/16/2020   Procedure: BIOPSY;  Surgeon: Aneita Gwendlyn DASEN, MD;  Location: [facility] ENDOSCOPY;  Service: Endoscopy;;   CARDIAC CATHETERIZATION N/A 04/18/2015   Procedure: Left Heart Cath and Coronary Angiography;  Surgeon: Rober Chroman, MD;  Location: [facility] INVASIVE CV LAB;  Service: Cardiovascular;  Laterality: N/A;   COLONOSCOPY     COLONOSCOPY W/ BIOPSIES AND POLYPECTOMY  2018   ESOPHAGOGASTRODUODENOSCOPY (EGD) WITH PROPOFOL  N/A 12/16/2020   Procedure: ESOPHAGOGASTRODUODENOSCOPY (EGD) WITH PROPOFOL ;  Surgeon: Aneita Gwendlyn DASEN, MD;  Location: [facility] ENDOSCOPY;  Service: Endoscopy;  Laterality: N/A;   FOOT SURGERY Bilateral    [facility] bunion,bone spur, tendon (1) -6'16, (1)-10'16   HEMATOMA EVACUATION N/A 01/05/2022   Procedure: Cervical Wound Exploration;  Surgeon: Gillie Duncans, MD;  Location: [facility] OR;  Service: Neurosurgery;  Laterality: N/A;   IR EPIDUROGRAPHY  07/21/2018   LUMBAR LAMINECTOMY/DECOMPRESSION MICRODISCECTOMY Bilateral 12/28/2015   Procedure: MICRO LUMBAR DECOMPRESSION L4 - L5 BILATERALLY;  Surgeon: Reyes Billing, MD;  Location:  [facility] ORS;  Service: Orthopedics;  Laterality: Bilateral;   LUMBAR LAMINECTOMY/DECOMPRESSION MICRODISCECTOMY Bilateral 03/04/2018   Procedure: Revision of Microlumbar Decompression Bilateral Lumbar Four-Five;  Surgeon: Duwayne Purchase, MD;  Location: [facility] OR;  Service: Orthopedics;  Laterality: Bilateral;  90 mins   LUMBAR WOUND DEBRIDEMENT N/A 12/25/2023   Procedure: Irrigation and debridement of lumbar wound;  Surgeon: Joshua Alm Hamilton, MD;  Location: [facility] OR;  Service: Neurosurgery;  Laterality: N/A;   POSTERIOR FUSION PEDICLE SCREW PLACEMENT N/A 12/07/2023   Procedure: LUMBAR FOUR-FIVE POSTERIOR LATERAL FUSION;  Surgeon: Joshua Alm Hamilton, MD;  Location: [facility] OR;  Service: Neurosurgery;   Laterality: N/A;   SAVORY DILATION N/A 12/16/2020   Procedure: SAVORY DILATION;  Surgeon: Aneita Gwendlyn DASEN, MD;  Location: [facility] ENDOSCOPY;  Service: Endoscopy;  Laterality: N/A;   SPINAL CORD STIMULATOR INSERTION N/A 09/28/2019   Procedure: THORACIC SPINAL CORD STIMULATOR INSERTION;  Surgeon: Burnetta Aures, MD;  Location: [facility] OR;  Service: Orthopedics;  Laterality: N/A;  2.5 hrs   SPINAL CORD STIMULATOR REMOVAL N/A 05/27/2021   Procedure: LUMBAR SPINAL CORD STIMULATOR REMOVAL;  Surgeon: Bluford Standing, MD;  Location: [facility] ORS;  Service: Neurosurgery;  Laterality: N/A;   TUBAL LIGATION     WISDOM TOOTH EXTRACTION     WOUND EXPLORATION N/A 03/04/2018   Procedure: EXPLORATION OF LUMBAR DECOMPRESSION WOUND;  Surgeon: Duwayne Purchase, MD;  Location: [facility] OR;  Service: Orthopedics;  Laterality: N/A;   Patient Active Problem List   Diagnosis Date Noted   Vitamin D  deficiency, unspecified 04/13/2024   Seasonal allergic rhinitis due to pollen 01/29/2024   Delayed wound healing 12/25/2023   S/P lumbar fusion 12/07/2023   Pain in thumb joint with movement of right hand 08/12/2023   Localized primary osteoarthritis of carpometacarpal joint of right thumb 07/29/2023   Right foot drop 07/29/2023   Frequent falls 04/14/2023   Gastroesophageal reflux disease 04/14/2023   Right upper extremity numbness 09/29/2022   Stage 3a chronic kidney disease (HCC) 09/29/2022   Cervical spondylosis with radiculopathy 01/05/2022   S/P cervical spinal fusion 01/03/2022   Cervical disc disorder with radiculopathy of cervical region 11/27/2021   Uncontrolled pain 05/27/2021   Syncope 05/13/2021   Depression with anxiety 05/13/2021   Chronic diastolic CHF (congestive heart failure) (HCC) 05/13/2021   Chronic back pain 05/13/2021   Chest tightness 05/13/2021   Lower abdominal pain 05/13/2021   Syncope and collapse 01/01/2021   Abnormal echocardiogram 01/01/2021   BPPV (benign paroxysmal positional vertigo), left  12/26/2020   Odynophagia    Dysphagia 12/15/2020   Postural dizziness with presyncope 12/14/2020   Lumbar post-laminectomy syndrome    Chronic pain 09/28/2019   Lumbar spinal stenosis    Radicular pain    Gait disorder    Difficulty in urination 07/13/2018   Back pain 07/12/2018   Hyperlipidemia 04/16/2018   GAD (generalized anxiety disorder) 04/16/2018   AKI (acute kidney injury) (HCC)    Benign essential HTN    Major depression, recurrent (HCC)    Lumbar radiculopathy 03/09/2018   Paraparesis (HCC) 03/09/2018   Essential hypertension    Chronic nonintractable headache    Leukocytosis    Acute blood loss anemia    Postoperative pain    Generalized weakness    Spinal stenosis at L4-L5 level 03/04/2018   Spinal stenosis of lumbar region 12/28/2015   Chest pain 04/16/2015    ONSET DATE: Most recent Surgery date 12-07-23:  Referral date 02-03-24  REFERRING DIAG:  M54.16 (ICD-10-CM) - Lumbar radiculopathy M96.1 (ICD-10-CM) -  Lumbar post-laminectomy syndrome R26.9 (ICD-10-CM) - Gait disorder  THERAPY DIAG:  Muscle weakness (generalized)  Other abnormalities of gait and mobility  Rationale for Evaluation and Treatment: Rehabilitation  SUBJECTIVE:                                                                                                                                                                                             SUBJECTIVE STATEMENT: Pt reports she has seen Dr. Babs since she was here last on 04-28-24 - she has recurrence of CTS on Rt hand - saw Dr. Germaine on Monday this week (hand specialist referred by Dr. Joshua) - is thinking she is going to go ahead and have the surgery; surgery scheduled for 06-15-24 Pt accompanied by: self  PERTINENT HISTORY: The patient was admitted on 12/07/2023 &  underwent PLIF L4-5. Hospitalized 12-07-23 - 12-11-23:  The patient was admitted on 12/25/2023 and taken to the operating room where the patient underwent irrigation and  debridement of lumbar wound with primary closure and placement of external wound VAC- hospitalized 12-25-23 -  12-26-23  Frequent falls:  Spinal cord stimulator insertion on 09-28-19; stimulator removal on 05-27-21 with resultant LLE weakness;  decompression and laminectomy on 12/28/15 at L4-L5  with redo decompression surgery on May 9,2019, post-op paraparesis with surgery to remove small hematoma; 07/12/18 fall at work (involved rolling chair) with hospital admission, transfer to CIR with d/c home 08/03/18; HTN, Bil foot surgery; left ventricular hypertrophy, chronic pain syndrome, dyspnea, hypertrophic cardiomyopathy; cervical fusion March 2023;  had carpal tunnel surgery RUE in April 2024:  LVH  PAIN:  Are you having pain? Pt rates pain 4/10 in lumbar region - greater pain in lower lumbar region near sacral area- same burning sensation Alleviating Factor:  placing pillow behind back when sitting Aggravating Factors: certain movements    PRECAUTIONS: Fall; No bending/lifting or twisting; pt states she has a reacher  WEIGHT BEARING RESTRICTIONS: No  FALLS: Has patient fallen in last 6 months? Yes. Number of falls 3  LIVING ENVIRONMENT: Lives with: lives with their spouse Lives in: House/apartment Stairs: Yes: External: 5 steps; on right going up Has following equipment at home: Single point cane, Wheelchair (manual), and L[place]strand crutch  PLOF: Independent with basic ADLs, Independent with household mobility with device, Independent with community mobility with device, and Independent with transfers  PATIENT GOALS: improve walking and strength Rt leg  OBJECTIVE:   DIAGNOSTIC FINDINGS: IMPRESSION: 1. Interval anterior discectomy and fusion from C4 through C6 with improved patency of the spinal canal and only mild residual foraminal narrowing. 2. Stable mild spinal stenosis and mild to moderate  left foraminal narrowing at C3-4. 3. Stable mild spinal stenosis and mild foraminal  narrowing bilaterally at C6-7. Mild foraminal narrowing bilaterally at C7-T1. 4. No cord deformity or abnormal cord signal.  No acute findings. No recent MRI of lumbar region  COGNITION: Overall cognitive status: Within functional limits for tasks assessed   SENSATION: WFL  COORDINATION: Decreased RLE due to weakness and spasticity  POSTURE: No Significant postural limitations  LOWER EXTREMITY ROM:   LLE WFL's;  RLE WFL's except for ankle ROM which is minimal due to weakness; Pt has difficulty performing Rt knee extension due to Rt quad weakness and spasticity - turned body slightly to Lt to achieve more knee extension   LOWER EXTREMITY MMT:    MMT Right Eval Left Eval  Hip flexion 3- 3+   Hip extension    Hip abduction    Hip adduction    Hip internal rotation    Hip external rotation    Knee flexion 3+   Knee extension 4- 4  Ankle dorsiflexion 2-   Ankle plantarflexion    Ankle inversion    Ankle eversion    (Blank rows = not tested)  BED MOBILITY:  Modified independent  TRANSFERS: Assistive device utilized: Rollator  Sit to stand: Modified independence Stand to sit: Modified independence   STAIRS:  Not tested due to c/o back pain   GAIT: Gait pattern: decreased step length- Right, decreased stance time- Right, decreased hip/knee flexion- Right, and decreased ankle dorsiflexion- Right Distance walked: 50' Assistive device utilized: with rollator Level of assistance: SBA and CGA Comments: slow gait speed due to c/o back pain and RLE weakness  FUNCTIONAL TESTS:  Sit to stand score - 2 reps - with bil. UE support from chair- 29.91 secs             TUG score: - 49.19 secs with rollator  Gait velocity:  37.38 secs = .88 ft/sec with rollator  PATIENT SURVEYS:  N/A - based on diagnosis of RLE weakness due to chronic lumbar radiculopathy   TODAY'S TREATMENT: 05-12-24                                                                                                                            Gait: Pt using rollator and wearing Blue rocker AFO on RLE - pt modified independent with use of rollator    TherAct: Step ups to 6 step - RLE 10x  Tap ups to 6 step with LLE - 15x  Tap ups with RLE to 4 step - 10 reps - for Rt hip flexor strengthening  Step ups to 4 step RLE 10 reps;  Step down exercise with RLE from 4 step 5 reps for increased Rt quad eccentric strengthening  Stepping forward, backward, and laterally with LLE 15 reps each direction - with cues to keep Rt knee slightly flexed - for Rt isometric strengthening  TherEx:  Scifit level 2.5 - bil. UE and LE's x 10;  ice pack applied to low back due to c/o burning sensation       UPDATED HEP: Access Code: [email or web address] URL: [email or web address] Date: 04/27/2024 Prepared by: Rock Kussmaul  Exercises - Seated Knee Extension with Resistance  - 1 x daily - 7 x weekly - 3 sets - 10 reps - TAP UPS - with Left leg   - 1 x daily - 7 x weekly - 3 sets - 10 reps - Standing March with Counter Support  - 1 x daily - 7 x weekly - 3 sets - 10 reps - Forward Step Up with Counter Support  - 1 x daily - 7 x weekly - 3 sets - 10 reps - Alternating Step Forward with Support  - 1 x daily - 7 x weekly - 3 sets - 10 reps   PATIENT EDUCATION: Education details:  Medbridge  Person educated: Patient Education method: Explanation - handout declined as pt stated she has it from previous OP admission Education comprehension: verbalized understanding  HOME EXERCISE PROGRAM:   GOALS: Goals reviewed with patient? Yes  LONG TERM GOALS: Target date: 04-15-24  Amb. 230' with RW with SBA without requiring seated rest period. Baseline: 134' x 1, 87' x 1 rep Goal status: Not met 04-19-24  2.  Improve TUG score to </= 28 secs  with use of RW for improved mobility and reduced fall risk. Baseline: 49.19 secs with rollator;  28.5 secs with rollator Goal status: Partially met 04-19-24  3.   Perform 5x sit to stand transfers with LUE support only to demo improved strength in RLE.  Baseline: 36.16 secs - 4x with bil. UE support from chair - had to stop due to burning sensation in low back Goal status: Not met 04-19-24  4.  Modified independent step negotiation with use of bil. Hand rails using step by step sequence. Baseline:  Goal status: Goal met 04-19-24  5.  Increase gait velocity to >/= 1.6 ft/sec with rollator for increased gait efficiency with reduced fall risk.          Baseline: 37.38 secs = .88 ft/sec with rollator;  33.44 secs = .98 ft/sec with rollator          Goal status:  Not met 04-19-24   6.  Independent in updated HEP for RLE strengthening. Baseline:  Goal status:  Goal met 04-19-24   7.  Report reduced low back pain at rest to </= 3/10 intensity.            Baseline:  7/10;    04-20-24 -- 5/10            Goal status:  Not met - 04-19-24   UPDATED STG'S:  TARGET DATE 05-13-24  1.  Amb. 230' with RW with SBA without requiring seated rest period.             Baseline: 134' x 1, 87' x 1 rep on 04-19-24     Goal status:  Ongoing; NT in today's session due to c/o Rt hand pain due to recurrence of CTS   2.  Improve TUG score to </= 26 secs  with use of RW for improved mobility and reduced fall risk. Baseline: 49.19 secs with rollator;  28.5 secs with rollator Goal status:  In progress  3.  Perform 5x sit to stand from chair with bil. UE support in </= 30 secs to demonstrate improved mobility.     Baseline:  36.16 secs - 4 reps with bil. UE support  from chair    Goal status:  In progress - not tested in today's session due to c/o Rt hand pain due to recurrence of CTS     4.  Pt will report reduced pain in low back to </= 4/10 intensity for increased ease with ADL's and mobility.    Baseline:  5/10 on 04-19-24    Goal status:  Goal met 05-12-24  UPDATED LONG TERM GOALS: Target date: 06-10-24   Amb. 350' with RW with SBA without requiring seated rest  period. Baseline: 134' x 1, 87' x 1 rep - 04-19-24 Goal status: NEW  2.  Improve TUG score to </= 22 secs  with use of RW for improved mobility and reduced fall risk. Baseline: 49.19 secs with rollator;  28.5 secs with rollator - 04-19-24 Goal status: NEW  3.  Perform 5x sit to stand transfers with LUE support only to demo improved strength in RLE IN </= 25 secs to demo improved functional mobility. Baseline: 36.16 secs - 4x with bil. UE support from chair - had to stop due to burning sensation in low back Goal status: NEW  4.  Increase gait velocity to >/= 1.6 ft/sec with rollator for increased gait efficiency with reduced fall risk.          Baseline: 37.38 secs = .88 ft/sec with rollator;  33.44 secs = .98 ft/sec with rollator          Goal status:  ONGOING   6.  Independent in updated HEP for RLE strengthening. Baseline:  Goal status:  ONGOING   7.  Report reduced low back pain at rest to </= 3/10 intensity.            Baseline:  7/10;    04-20-24 -- 5/10            Goal status:  ONGOING   ASSESSMENT:  CLINICAL IMPRESSION PT session focused on RLE strengthening in standing position and assessment of STG's.  Pt reports increased Rt wrist and hand pain due to recurrence of CTS; pt is scheduled for surgery for CTS on 06-15-24. Pt has met STG #4;  STG's #1-3 are ongoing as ambulation with rollator and sit to stand transfers are impacted by Rt wrist and hand pain due to CTS.  Pt reports surgery is scheduled on 06-15-24.  Cont with POC.   OBJECTIVE IMPAIRMENTS: Abnormal gait, decreased balance, decreased coordination, decreased endurance, decreased strength, and impaired tone.   ACTIVITY LIMITATIONS: carrying, bending, squatting, stairs, transfers, and locomotion level  PARTICIPATION LIMITATIONS: cleaning, laundry, shopping, and community activity  PERSONAL FACTORS: Behavior pattern, Past/current experiences, and Time since onset of injury/illness/exacerbation are also affecting  patient's functional outcome.   REHAB POTENTIAL: Good  CLINICAL DECISION MAKING: Evolving/moderate complexity  EVALUATION COMPLEXITY: Moderate  PLAN:  PT FREQUENCY: 2x/week  PT DURATION: 8 weeks  PLANNED INTERVENTIONS: Therapeutic exercises, Therapeutic activity, Neuromuscular re-education, Balance training, Gait training, Patient/Family education, Self Care, Stair training, Orthotic/Fit training, DME instructions, and Aquatic Therapy  PLAN FOR NEXT SESSION:   cont RLE strengthening; try step up exercise with 6 step; standing exercises    Lucresha Dismuke, [place], PT 05/13/2024, 5:31 PM

## 2024-05-13 ENCOUNTER — Encounter: Payer: Self-pay | Admitting: Physical Therapy

## 2024-05-17 ENCOUNTER — Other Ambulatory Visit: Payer: Self-pay

## 2024-05-17 NOTE — Patient Instructions (Signed)
 Visit Information  Thank you for taking time to visit with me today. Please don't hesitate to contact me if I can be of assistance to you before our next scheduled telephone appointment.  Our next appointment is by telephone on 8/19 at 9:45 AM  Following is a copy of your care plan:   Goals Addressed             This Visit's Progress    VBC[facility] RN Care Plan       Problems:  Chronic Disease Management support and education needs related to Back Pain  Goal: Over the next 4 months the Patient will demonstrate Improved adherence to prescribed treatment plan for back pain as evidenced by attending outpatient PT as ordered, rate back pain of 5 or less. 02/26/24 televisit w/ RNCM, pain remains higher than desired, at 7 out of pain scale 0-10. On 5/16 televisit w/ RNCM, pain level was much improved at and at desired level of <5, rating pain at 4 out of pain scale 0-10. On 7/7 televisit w/ RNCM, pain level rated at 5-6, has follow up with Dr. Babs planned for 7/9 to further address post surgical back pain. During 7/21 RNCM appt, patient stated post-op pain has improved to desired level of <5, patient also reported that her Orthopedic Surgeon stated that she had extensive & complex back surgery and it was not unusual for full healing to take 8-10 months. Patient continues her twice weekly PT sessions with Physical Therapist Elvie Ipoijb who patient feels has helped tremendously with her post op progress.  Interventions:   Pain Interventions: Pain assessment performed Medications reviewed Reviewed provider established plan for pain management Discussed importance of adherence to all scheduled medical appointments Counseled on the importance of reporting any/all new or changed pain symptoms or management strategies to pain management provider Discussed use of relaxation techniques and/or diversional activities to assist with pain reduction (distraction, imagery, relaxation, massage, acupressure,  TENS, heat, and cold application Reviewed with patient prescribed pharmacological and nonpharmacological pain relief strategies Screening for signs and symptoms of depression related to chronic disease state  Assessed social determinant of health barriers  Patient Self-Care Activities:  Attend all scheduled provider appointments Call pharmacy for medication refills 3-7 days in advance of running out of medications Call provider office for new concerns or questions  Take medications as prescribed    Plan:  Telephone follow up appointment with care management team member scheduled for:  Tuesday, August 19 at 9:45am.             Patient verbalizes understanding of instructions and care plan provided today and agrees to view in MyChart. Active MyChart status and patient understanding of how to access instructions and care plan via MyChart confirmed with patient.     The patient has been provided with contact information for the care management team and has been advised to call with any health related questions or concerns.   Please call the care guide team at [phone] if you need to cancel or reschedule your appointment.   Please call 1-800-273-TALK (toll free, 24 hour hotline) if you are experiencing a Mental Health or Behavioral Health Crisis or need someone to talk to.  Malaka Ruffner A. Gordy RN, BA, [employer], CRRN [place]  [facility] Population Health RN Care Manager Direct Dial: [phone]  Fax: [phone]

## 2024-05-17 NOTE — Patient Outreach (Signed)
 Complex Care Management   Visit Note  05/17/2024  Name:  [name] MRN: [id number] DOB: 07-26-1961  Situation: Referral received for Complex Care Management related to Chronic Back Pain and Lumbar Post-Laminectomy Syndrome. I obtained verbal consent from Patient.  Visit completed with patient  on the phone  Background:   Past Medical History:  Diagnosis Date   Anxiety    Aortic atherosclerosis (HCC) 04/16/2021   Atypical angina (HCC)    Back pain    related to spinal stenosis and disc problem, radiates down left buttocks to leg., weakness occ.   Chest pain    a. 03/2015 Cath: nl cors; b. 03/2021 Cor CTA: Ca2+ = 0. Nl Cors.   Chronic pain syndrome    Dyspnea    hx   GERD (gastroesophageal reflux disease)    Grade I diastolic dysfunction    Headache    Hyperlipidemia    Hypertension    Lumbar post-laminectomy syndrome    LVH (left ventricular hypertrophy) 12/15/2020   a. 11/2020 Echo: EF 65-70%, no rwma, sev asymm LVH with IVSd 1.9 cm. No LVOT obs @ rest. Gr1 DD. Triv MR.   PONV (postoperative nausea and vomiting)    Pulmonary nodules    a. 03/2021 CT Chest: 3mm pulm nodules in bilat lower lobes. F/u 1 yr.   Right foot drop    Syncope    a. 11/2020 Zio: No significant arrhythmias.   Vaginal foreign object    Uses Femring     Assessment: Patient Reported Symptoms:  Cognitive Cognitive Status: Alert and oriented to person, place, and time, Normal speech and language skills, Insightful and able to interpret abstract concepts Cognitive/Intellectual Conditions Management [RPT]: None reported or documented in medical history or problem list   Health Maintenance Behaviors: Exercise, Social activities, Sleep adequate, Spiritual practice(s), Annual physical exam, Healthy diet Healing Pattern: Slow Health Facilitated by: Pain control, Prayer/meditation, Rest, Healthy diet  Neurological Neurological Review of Symptoms: No symptoms reported    HEENT HEENT Symptoms  Reported: No symptoms reported      Cardiovascular Cardiovascular Symptoms Reported: No symptoms reported Does patient have uncontrolled Hypertension?: No Is patient checking Blood Pressure at home?: Yes Patient's Recent BP reading at home: 120's - 130's/70's - 80's Cardiovascular Management Strategies: Adequate rest, Coping strategies, Medication therapy Weight: 162 lb (73.5 kg) Cardiovascular Self-Management Outcome: 4 (good) Cardiovascular Comment: Chest Pain episode on 04/28/24 was followed up on by Cardiology - was attributed to musculoskeletal pain  Respiratory Respiratory Symptoms Reported: No symptoms reported    Endocrine Endocrine Symptoms Reported: No symptoms reported Is patient diabetic?: No    Gastrointestinal Gastrointestinal Symptoms Reported: No symptoms reported      Genitourinary Genitourinary Symptoms Reported: No symptoms reported    Integumentary Integumentary Symptoms Reported: No symptoms reported    Musculoskeletal Musculoskelatal Symptoms Reviewed: Back pain, Muscle pain, Unsteady gait Additional Musculoskeletal Details: essentially no change in symptoms from last engagement, slowly recovering from major back surgery, pain easing, usually <5 Musculoskeletal Management Strategies: Medical device, Medication therapy, Coping strategies, Adequate rest, Exercise, Routine screening, Activity Musculoskeletal Self-Management Outcome: 4 (good) Musculoskeletal Comment: continues to see Physical Therapist Elvie Kussmaul for Outpatient PT twice weekly Falls in the past year?: No    Psychosocial Psychosocial Symptoms Reported: No symptoms reported Additional Psychological Details: has hx of MDD and GAD, takes Cymbalta  30mg  daily Behavioral Management Strategies: Medication therapy, Adequate rest, Coping strategies, Support group Behavioral Health Self-Management Outcome: 4 (good) Major Change/Loss/Stressor/Fears (CP): Medical condition, self Techniques to [employer]  with  Loss/Stress/Change: Diversional activities, Exercise, Spiritual practice(s), Medication Quality of Family Relationships: helpful, involved, supportive Do you feel physically threatened by others?: No      05/04/2024   10:59 AM  Depression screen PHQ 2/9  Decreased Interest 0  Down, Depressed, Hopeless 0  PHQ - 2 Score 0    There were no vitals filed for this visit.  Medications Reviewed Today     Reviewed by Gordy Channing LABOR, RN (Registered Nurse) on 05/17/24 at [phone]  Med List Status: <None>   Medication Order Taking? Sig Documenting Provider Last Dose Status Informant  acetaminophen  (TYLENOL ) 500 MG tablet [id number] Yes Take 500 mg by mouth every 6 (six) hours as needed for headache. [provider]  Active Self  ALPRAZolam  (XANAX ) 0.5 MG tablet [id number] Yes Take 1/2 - 1 tablet by mouth at bedtime as needed for anxiety. [place], [name], MD  Active   amitriptyline  (ELAVIL ) 25 MG tablet [id number] Yes Take 1 tablet (25 mg total) by mouth daily.   Active Self  amLODipine  (NORVASC ) 5 MG tablet [id number] Yes Take 1 tablet (5 mg total) by mouth daily. Mona Vinie BROCKS, MD  Active Self  atorvastatin  (LIPITOR) 20 MG tablet [id number] Yes Take 1 tablet (20 mg total) by mouth daily. [place], [name], MD  Active Self  baclofen  (LIORESAL ) 10 MG tablet [id number] Yes Take 1 tablet (10 mg total) by mouth 3 (three) times daily as needed. Babs Arthea DASEN, MD  Active Self           Med Note JERALYN, [place] A   Mon Nov 30, 2023  1:44 PM) Pt alternates takes baclofen  and methocarbamol    betamethasone  dipropionate 0.05 % cream [id number] Yes Apply as directed to skin once a day to itchy areas only avoiding normal skin as needed   Active   carvedilol  (COREG ) 25 MG tablet [id number] Yes Take 1 tablet (25 mg total) by mouth 2 (two) times daily with a meal. Vannie Reche RAMAN, NP  Active   cholecalciferol  (VITAMIN D3) 25 MCG (1000 UNIT) tablet [id number] Yes Take 1,000 Units by mouth daily. [provider]  Active Self  cromolyn  (OPTICROM ) 4 % ophthalmic solution [id number] Yes Place 1-2 drops into both eyes 4 (four) times daily as needed. [place], [name], MD  Active   diphenhydrAMINE  (BENADRYL ) 25 MG tablet [id number] Yes Take 25 mg by mouth daily as needed for itching. [provider]  Active Self  docusate sodium  (COLACE) 100 MG capsule [id number] Yes Take 100 mg by mouth 2 (two) times daily. [provider]  Active   DULoxetine  (CYMBALTA ) 30 MG capsule [id number] Yes Take 2 capsules (60 mg total) by mouth daily. Babs Arthea DASEN, MD  Active Self           Med Note (ROSE, ALAN RAYMOND Kitchens Dec 14, 2023  9:30 AM) Patient is taking 30 mg once a day.   Estradiol  Acetate (FEMRING ) 0.05 MG/24HR RING [id number] Yes USE ONE RING VAGINALLY EVERY 3 MONTHS AS DIRECTED   Active   fluconazole  (DIFLUCAN ) 200 MG tablet [id number] Yes Take 1 tablet by mouth once a week   Active Self  gabapentin  (NEURONTIN ) 600 MG tablet [id number] Yes Take 1 tablet (600 mg total) by mouth 3 (three) times daily. Babs Arthea DASEN, MD  Active Self  hydrALAZINE  (APRESOLINE ) 50 MG tablet [id number]  Take 1 tablet (50 mg total) by mouth 3 (three) times daily.  Patient taking differently: Take 50 mg by mouth  2 (two) times daily.   West, Katlyn D, NP  Expired 05/04/24 2359 Self           Med Note (ROSE, AMANDA U   Mon Dec 14, 2023  9:31 AM) Reports taking 2 times per day    Discontinued 04/21/24 1712 (Allergic reaction)            Med Note>> Gordy Channing LABOR, RN   02/26/2024  9:13 AM Itching, Hives, Breathing Problems    methocarbamol  (ROBAXIN ) 500 MG tablet [id number] Yes Take 1 tablet (500 mg total) by mouth 3 (three) times daily as needed for muscle spasm. Babs Arthea DASEN, MD  Active   mometasone  (NASONEX ) 50 MCG/ACT nasal spray [id number] Yes Place 2 sprays into the nose daily. [place], [name], MD  Active     Discontinued 09/05/23 1325 (Patient Preference)            Med Note>> Sheela Duwaine CROME, CPhT    09/05/2023  1:25 PM non longer taking    ondansetron  (ZOFRAN ) 4 MG tablet [id number] Yes Take 1 tablet (4 mg tablet) 30 - 60 minutes each prep dose Danis, Henry L III, MD  Active Self           Med Note (ROSE, AMANDA U   Wed Jan 27, 2024 11:11 AM) Old RX.  No refills.  pregabalin  (LYRICA ) 300 MG capsule [id number]  Take 1 capsule (300 mg total) by mouth 2 (two) times daily. Gregg Lek, MD  Expired 05/04/24 2359 Self  spironolactone  (ALDACTONE ) 50 MG tablet [id number] Yes Take 1 tablet (50 mg total) by mouth daily. Mona Vinie BROCKS, MD  Active   tapentadol  (NUCYNTA ) 50 MG tablet [id number] Yes Take 1 tablet (50 mg total) by mouth every 6 (six) to 8 (eight) hours as needed. Babs Arthea DASEN, MD  Active   terbinafine (LAMISIL) 1 % cream [id number] Yes Apply 1 Application topically 2 (two) times daily as needed (rash). [provider]  Active Self            Recommendation:   Continue Current Plan of Care  Follow Up Plan:   Telephone follow up appointment date/time:  06/14/24 at 9:45 am with [facility] A. Gordy RN, BA, [facility], CRRN [place]  [facility] RN Care Manager Direct Dial: [phone]  Fax: [phone]

## 2024-05-23 ENCOUNTER — Ambulatory Visit: Payer: Self-pay | Admitting: Physical Therapy

## 2024-05-23 ENCOUNTER — Encounter: Payer: Self-pay | Admitting: Physical Therapy

## 2024-05-23 ENCOUNTER — Other Ambulatory Visit (HOSPITAL_COMMUNITY): Payer: Self-pay

## 2024-05-23 DIAGNOSIS — M6281 Muscle weakness (generalized): Secondary | ICD-10-CM

## 2024-05-23 DIAGNOSIS — R2689 Other abnormalities of gait and mobility: Secondary | ICD-10-CM | POA: Diagnosis not present

## 2024-05-23 NOTE — Therapy (Signed)
 OUTPATIENT PHYSICAL THERAPY NEURO TREATMENT NOTE     Patient Name: [name] MRN: [id number] DOB:1961-07-06, 63 y.o., female Today's Date: 05/23/2024   PCP: [place], [name]., MD REFERRING PROVIDER: Babs Arthea DASEN, MD  END OF SESSION:  PT End of Session - 05/23/24 1944     Visit Number 16    Number of Visits 28    Date for PT Re-Evaluation 06/10/24    Authorization Type [employer]    Authorization Time Period 02-16-24 -04-25-24:  04-19-24 - 06-26-24    Progress Note Due on Visit 20    PT Start Time 1020    PT Stop Time 1103    PT Time Calculation (min) 43 min    Equipment Utilized During Treatment --    Activity Tolerance Patient tolerated treatment well    Behavior During Therapy [facility] for tasks assessed/performed                         Past Medical History:  Diagnosis Date   Anxiety    Aortic atherosclerosis (HCC) 04/16/2021   Atypical angina (HCC)    Back pain    related to spinal stenosis and disc problem, radiates down left buttocks to leg., weakness occ.   Chest pain    a. 03/2015 Cath: nl cors; b. 03/2021 Cor CTA: Ca2+ = 0. Nl Cors.   Chronic pain syndrome    Dyspnea    hx   GERD (gastroesophageal reflux disease)    Grade I diastolic dysfunction    Headache    Hyperlipidemia    Hypertension    Lumbar post-laminectomy syndrome    LVH (left ventricular hypertrophy) 12/15/2020   a. 11/2020 Echo: EF 65-70%, no rwma, sev asymm LVH with IVSd 1.9 cm. No LVOT obs @ rest. Gr1 DD. Triv MR.   PONV (postoperative nausea and vomiting)    Pulmonary nodules    a. 03/2021 CT Chest: 3mm pulm nodules in bilat lower lobes. F/u 1 yr.   Right foot drop    Syncope    a. 11/2020 Zio: No significant arrhythmias.   Vaginal foreign object    Uses Femring    Past Surgical History:  Procedure Laterality Date   ABDOMINAL HYSTERECTOMY     ANTERIOR CERVICAL DECOMP/DISCECTOMY FUSION N/A 01/03/2022   Procedure: Anterior Cervical Decompression Fusion -  Cervical four-Cervical five - Cervical five-Cervical six;  Surgeon: Joshua Alm RAMAN, MD;  Location: [facility] OR;  Service: Neurosurgery;  Laterality: N/A;   BIOPSY  12/16/2020   Procedure: BIOPSY;  Surgeon: Aneita Gwendlyn DASEN, MD;  Location: [facility] ENDOSCOPY;  Service: Endoscopy;;   CARDIAC CATHETERIZATION N/A 04/18/2015   Procedure: Left Heart Cath and Coronary Angiography;  Surgeon: Rober Chroman, MD;  Location: [facility] INVASIVE CV LAB;  Service: Cardiovascular;  Laterality: N/A;   COLONOSCOPY     COLONOSCOPY W/ BIOPSIES AND POLYPECTOMY  2018   ESOPHAGOGASTRODUODENOSCOPY (EGD) WITH PROPOFOL  N/A 12/16/2020   Procedure: ESOPHAGOGASTRODUODENOSCOPY (EGD) WITH PROPOFOL ;  Surgeon: Aneita Gwendlyn DASEN, MD;  Location: [facility] ENDOSCOPY;  Service: Endoscopy;  Laterality: N/A;   FOOT SURGERY Bilateral    [facility] bunion,bone spur, tendon (1) -6'16, (1)-10'16   HEMATOMA EVACUATION N/A 01/05/2022   Procedure: Cervical Wound Exploration;  Surgeon: Gillie Duncans, MD;  Location: [facility] OR;  Service: Neurosurgery;  Laterality: N/A;   IR EPIDUROGRAPHY  07/21/2018   LUMBAR LAMINECTOMY/DECOMPRESSION MICRODISCECTOMY Bilateral 12/28/2015   Procedure: MICRO LUMBAR DECOMPRESSION L4 - L5 BILATERALLY;  Surgeon: Reyes Billing, MD;  Location:  [facility] ORS;  Service: Orthopedics;  Laterality: Bilateral;   LUMBAR LAMINECTOMY/DECOMPRESSION MICRODISCECTOMY Bilateral 03/04/2018   Procedure: Revision of Microlumbar Decompression Bilateral Lumbar Four-Five;  Surgeon: Duwayne Purchase, MD;  Location: [facility] OR;  Service: Orthopedics;  Laterality: Bilateral;  90 mins   LUMBAR WOUND DEBRIDEMENT N/A 12/25/2023   Procedure: Irrigation and debridement of lumbar wound;  Surgeon: Joshua Alm Hamilton, MD;  Location: [facility] OR;  Service: Neurosurgery;  Laterality: N/A;   POSTERIOR FUSION PEDICLE SCREW PLACEMENT N/A 12/07/2023   Procedure: LUMBAR FOUR-FIVE POSTERIOR LATERAL FUSION;  Surgeon: Joshua Alm Hamilton, MD;  Location: [facility] OR;  Service: Neurosurgery;  Laterality: N/A;    SAVORY DILATION N/A 12/16/2020   Procedure: SAVORY DILATION;  Surgeon: Aneita Gwendlyn DASEN, MD;  Location: [facility] ENDOSCOPY;  Service: Endoscopy;  Laterality: N/A;   SPINAL CORD STIMULATOR INSERTION N/A 09/28/2019   Procedure: THORACIC SPINAL CORD STIMULATOR INSERTION;  Surgeon: Burnetta Aures, MD;  Location: [facility] OR;  Service: Orthopedics;  Laterality: N/A;  2.5 hrs   SPINAL CORD STIMULATOR REMOVAL N/A 05/27/2021   Procedure: LUMBAR SPINAL CORD STIMULATOR REMOVAL;  Surgeon: Bluford Standing, MD;  Location: [facility] ORS;  Service: Neurosurgery;  Laterality: N/A;   TUBAL LIGATION     WISDOM TOOTH EXTRACTION     WOUND EXPLORATION N/A 03/04/2018   Procedure: EXPLORATION OF LUMBAR DECOMPRESSION WOUND;  Surgeon: Duwayne Purchase, MD;  Location: [facility] OR;  Service: Orthopedics;  Laterality: N/A;   Patient Active Problem List   Diagnosis Date Noted   Vitamin D  deficiency, unspecified 04/13/2024   Seasonal allergic rhinitis due to pollen 01/29/2024   Delayed wound healing 12/25/2023   S/P lumbar fusion 12/07/2023   Pain in thumb joint with movement of right hand 08/12/2023   Localized primary osteoarthritis of carpometacarpal joint of right thumb 07/29/2023   Right foot drop 07/29/2023   Frequent falls 04/14/2023   Gastroesophageal reflux disease 04/14/2023   Right upper extremity numbness 09/29/2022   Stage 3a chronic kidney disease (HCC) 09/29/2022   Cervical spondylosis with radiculopathy 01/05/2022   S/P cervical spinal fusion 01/03/2022   Cervical disc disorder with radiculopathy of cervical region 11/27/2021   Uncontrolled pain 05/27/2021   Syncope 05/13/2021   Depression with anxiety 05/13/2021   Chronic diastolic CHF (congestive heart failure) (HCC) 05/13/2021   Chronic back pain 05/13/2021   Chest tightness 05/13/2021   Lower abdominal pain 05/13/2021   Syncope and collapse 01/01/2021   Abnormal echocardiogram 01/01/2021   BPPV (benign paroxysmal positional vertigo), left 12/26/2020   Odynophagia     Dysphagia 12/15/2020   Postural dizziness with presyncope 12/14/2020   Lumbar post-laminectomy syndrome    Chronic pain 09/28/2019   Lumbar spinal stenosis    Radicular pain    Gait disorder    Difficulty in urination 07/13/2018   Back pain 07/12/2018   Hyperlipidemia 04/16/2018   GAD (generalized anxiety disorder) 04/16/2018   AKI (acute kidney injury) (HCC)    Benign essential HTN    Major depression, recurrent (HCC)    Lumbar radiculopathy 03/09/2018   Paraparesis (HCC) 03/09/2018   Essential hypertension    Chronic nonintractable headache    Leukocytosis    Acute blood loss anemia    Postoperative pain    Generalized weakness    Spinal stenosis at L4-L5 level 03/04/2018   Spinal stenosis of lumbar region 12/28/2015   Chest pain 04/16/2015    ONSET DATE: Most recent Surgery date 12-07-23:  Referral date 02-03-24  REFERRING DIAG:  M54.16 (ICD-10-CM) - Lumbar radiculopathy M96.1 (ICD-10-CM) -  Lumbar post-laminectomy syndrome R26.9 (ICD-10-CM) - Gait disorder  THERAPY DIAG:  Muscle weakness (generalized)  Rationale for Evaluation and Treatment: Rehabilitation  SUBJECTIVE:                                                                                                                                                                                             SUBJECTIVE STATEMENT: Pt reports increased back pain with burning sensation; says she is going to call Dr. Joshua and maybe get an appt - doesn't think she should be having this much pain 5 months after surgery  Pt accompanied by: self  PERTINENT HISTORY: The patient was admitted on 12/07/2023 &  underwent PLIF L4-5. Hospitalized 12-07-23 - 12-11-23:  The patient was admitted on 12/25/2023 and taken to the operating room where the patient underwent irrigation and debridement of lumbar wound with primary closure and placement of external wound VAC- hospitalized 12-25-23 -  12-26-23  Frequent falls:  Spinal cord stimulator  insertion on 09-28-19; stimulator removal on 05-27-21 with resultant LLE weakness;  decompression and laminectomy on 12/28/15 at L4-L5  with redo decompression surgery on May 9,2019, post-op paraparesis with surgery to remove small hematoma; 07/12/18 fall at work (involved rolling chair) with hospital admission, transfer to CIR with d/c home 08/03/18; HTN, Bil foot surgery; left ventricular hypertrophy, chronic pain syndrome, dyspnea, hypertrophic cardiomyopathy; cervical fusion March 2023;  had carpal tunnel surgery RUE in April 2024:  LVH  PAIN:  Are you having pain? Pt rates pain 6-7/10 in lumbar region - greater pain in lower lumbar region near sacral area- same burning sensation Alleviating Factor:  placing pillow behind back when sitting Aggravating Factors: certain movements    PRECAUTIONS: Fall; No bending/lifting or twisting; pt states she has a reacher  WEIGHT BEARING RESTRICTIONS: No  FALLS: Has patient fallen in last 6 months? Yes. Number of falls 3  LIVING ENVIRONMENT: Lives with: lives with their spouse Lives in: House/apartment Stairs: Yes: External: 5 steps; on right going up Has following equipment at home: Single point cane, Wheelchair (manual), and L[place]strand crutch  PLOF: Independent with basic ADLs, Independent with household mobility with device, Independent with community mobility with device, and Independent with transfers  PATIENT GOALS: improve walking and strength Rt leg  OBJECTIVE:   DIAGNOSTIC FINDINGS: IMPRESSION: 1. Interval anterior discectomy and fusion from C4 through C6 with improved patency of the spinal canal and only mild residual foraminal narrowing. 2. Stable mild spinal stenosis and mild to moderate left foraminal narrowing at C3-4. 3. Stable mild spinal stenosis and mild foraminal narrowing bilaterally at C6-7. Mild foraminal narrowing bilaterally at C7-T1. 4. No  cord deformity or abnormal cord signal.  No acute findings. No recent MRI of lumbar  region  COGNITION: Overall cognitive status: Within functional limits for tasks assessed   SENSATION: WFL  COORDINATION: Decreased RLE due to weakness and spasticity  POSTURE: No Significant postural limitations  LOWER EXTREMITY ROM:   LLE WFL's;  RLE WFL's except for ankle ROM which is minimal due to weakness; Pt has difficulty performing Rt knee extension due to Rt quad weakness and spasticity - turned body slightly to Lt to achieve more knee extension   LOWER EXTREMITY MMT:    MMT Right Eval Left Eval  Hip flexion 3- 3+   Hip extension    Hip abduction    Hip adduction    Hip internal rotation    Hip external rotation    Knee flexion 3+   Knee extension 4- 4  Ankle dorsiflexion 2-   Ankle plantarflexion    Ankle inversion    Ankle eversion    (Blank rows = not tested)  BED MOBILITY:  Modified independent  TRANSFERS: Assistive device utilized: Rollator  Sit to stand: Modified independence Stand to sit: Modified independence   STAIRS:  Not tested due to c/o back pain   GAIT: Gait pattern: decreased step length- Right, decreased stance time- Right, decreased hip/knee flexion- Right, and decreased ankle dorsiflexion- Right Distance walked: 50' Assistive device utilized: with rollator Level of assistance: SBA and CGA Comments: slow gait speed due to c/o back pain and RLE weakness  FUNCTIONAL TESTS:  Sit to stand score - 2 reps - with bil. UE support from chair- 29.91 secs             TUG score: - 49.19 secs with rollator  Gait velocity:  37.38 secs = .88 ft/sec with rollator  PATIENT SURVEYS:  N/A - based on diagnosis of RLE weakness due to chronic lumbar radiculopathy   TODAY'S TREATMENT: 05-23-24                                                                                                                           Gait: Pt using rollator and wearing Blue rocker AFO on RLE - pt modified independent with use of rollator    TherEx:  Pt performed  following exercises in supine position due to c/o increased low back pain in today's session:  Pelvic tilt 10 reps Pelvic tilt with hip flexion with knee flexed in hooklying position 10 reps each leg RLE - heel slide - with pelvic tilt prior to flexing RLE in supine Trunk rotation - 3 reps to each side - 10 sec hold Small range bridge 5 reps only due to c/o back discomfort  Pt rolled supine to Rt sidelying to prone; performed prone on elbows - small range push ups with approx. 5 sec hold, 5 reps to increase thoracic extension Cues to roll to Lt sidelying for transferring to sitting position using log roll technique  Scifit level 2.5 - bil.  UE and LE's x 10;  ice pack applied to low back due to c/o burning sensation  Updated HEP to include low back stretching and strengthening due to c/o incr. Pain  Medbridge:   Access Code: [email or web address] URL: [email or web address] Date: 05/23/2024 Prepared by: Rock Kussmaul  Exercises - Supine Posterior Pelvic Tilt  - 1 x daily - 7 x weekly - 1 sets - 10 reps - 5 sec hold - Supine Heel Slides  - 1 x daily - 7 x weekly - 1 sets - 10 reps - Lifting leg up/down in this position  - 1 x daily - 7 x weekly - 1 sets - 10 reps - Supine Lower Trunk Rotation  - 1 x daily - 7 x weekly - 1 sets - 5 reps - Supine Bridge with Pelvic Floor Contraction  - 1 x daily - 7 x weekly - 1 sets - 5 reps - 3 sec hold   UPDATED HEP: Access Code: [email or web address] URL: [email or web address] Date: 04/27/2024 Prepared by: Rock Kussmaul  Exercises - Seated Knee Extension with Resistance  - 1 x daily - 7 x weekly - 3 sets - 10 reps - TAP UPS - with Left leg   - 1 x daily - 7 x weekly - 3 sets - 10 reps - Standing March with Counter Support  - 1 x daily - 7 x weekly - 3 sets - 10 reps - Forward Step Up with Counter Support  - 1 x daily - 7 x weekly - 3 sets - 10 reps - Alternating Step Forward with Support  - 1 x daily - 7 x weekly - 3 sets - 10  reps   PATIENT EDUCATION: Education details:  Medbridge  Person educated: Patient Education method: Explanation - handout declined as pt stated she has it from previous OP admission Education comprehension: verbalized understanding  HOME EXERCISE PROGRAM:   GOALS: Goals reviewed with patient? Yes  LONG TERM GOALS: Target date: 04-15-24  Amb. 230' with RW with SBA without requiring seated rest period. Baseline: 134' x 1, 87' x 1 rep Goal status: Not met 04-19-24  2.  Improve TUG score to </= 28 secs  with use of RW for improved mobility and reduced fall risk. Baseline: 49.19 secs with rollator;  28.5 secs with rollator Goal status: Partially met 04-19-24  3.  Perform 5x sit to stand transfers with LUE support only to demo improved strength in RLE.  Baseline: 36.16 secs - 4x with bil. UE support from chair - had to stop due to burning sensation in low back Goal status: Not met 04-19-24  4.  Modified independent step negotiation with use of bil. Hand rails using step by step sequence. Baseline:  Goal status: Goal met 04-19-24  5.  Increase gait velocity to >/= 1.6 ft/sec with rollator for increased gait efficiency with reduced fall risk.          Baseline: 37.38 secs = .88 ft/sec with rollator;  33.44 secs = .98 ft/sec with rollator          Goal status:  Not met 04-19-24   6.  Independent in updated HEP for RLE strengthening. Baseline:  Goal status:  Goal met 04-19-24   7.  Report reduced low back pain at rest to </= 3/10 intensity.            Baseline:  7/10;    04-20-24 -- 5/10            Goal status:  Not  met - 04-19-24   UPDATED STG'S:  TARGET DATE 05-13-24  1.  Amb. 230' with RW with SBA without requiring seated rest period.             Baseline: 134' x 1, 87' x 1 rep on 04-19-24     Goal status:  Ongoing; NT in today's session due to c/o Rt hand pain due to recurrence of CTS   2.  Improve TUG score to </= 26 secs  with use of RW for improved mobility and reduced fall  risk. Baseline: 49.19 secs with rollator;  28.5 secs with rollator Goal status:  In progress  3.  Perform 5x sit to stand from chair with bil. UE support in </= 30 secs to demonstrate improved mobility.     Baseline:  36.16 secs - 4 reps with bil. UE support from chair    Goal status:  In progress - not tested in today's session due to c/o Rt hand pain due to recurrence of CTS     4.  Pt will report reduced pain in low back to </= 4/10 intensity for increased ease with ADL's and mobility.    Baseline:  5/10 on 04-19-24    Goal status:  Goal met 05-12-24  UPDATED LONG TERM GOALS: Target date: 06-10-24   Amb. 350' with RW with SBA without requiring seated rest period. Baseline: 134' x 1, 87' x 1 rep - 04-19-24 Goal status: NEW  2.  Improve TUG score to </= 22 secs  with use of RW for improved mobility and reduced fall risk. Baseline: 49.19 secs with rollator;  28.5 secs with rollator - 04-19-24 Goal status: NEW  3.  Perform 5x sit to stand transfers with LUE support only to demo improved strength in RLE IN </= 25 secs to demo improved functional mobility. Baseline: 36.16 secs - 4x with bil. UE support from chair - had to stop due to burning sensation in low back Goal status: NEW  4.  Increase gait velocity to >/= 1.6 ft/sec with rollator for increased gait efficiency with reduced fall risk.          Baseline: 37.38 secs = .88 ft/sec with rollator;  33.44 secs = .98 ft/sec with rollator          Goal status:  ONGOING   6.  Independent in updated HEP for RLE strengthening. Baseline:  Goal status:  ONGOING   7.  Report reduced low back pain at rest to </= 3/10 intensity.            Baseline:  7/10;    04-20-24 -- 5/10            Goal status:  ONGOING   ASSESSMENT:  CLINICAL IMPRESSION PT session focused on gentle low back strengthening exercises due to c/o increased low back pain at start of today's session.  Pt reported pain intensity 6-7/10 at start of session and reported intensity  5/10 at end of session.  Pt tolerated these exercises well, including lying prone propped on forearms.  HEP was modified to include these low back stabilization exercises.  Cont with POC.   OBJECTIVE IMPAIRMENTS: Abnormal gait, decreased balance, decreased coordination, decreased endurance, decreased strength, and impaired tone.   ACTIVITY LIMITATIONS: carrying, bending, squatting, stairs, transfers, and locomotion level  PARTICIPATION LIMITATIONS: cleaning, laundry, shopping, and community activity  PERSONAL FACTORS: Behavior pattern, Past/current experiences, and Time since onset of injury/illness/exacerbation are also affecting patient's functional outcome.   REHAB POTENTIAL: Good  CLINICAL DECISION MAKING: Evolving/moderate complexity  EVALUATION COMPLEXITY: Moderate  PLAN:  PT FREQUENCY: 2x/week  PT DURATION: 8 weeks  PLANNED INTERVENTIONS: Therapeutic exercises, Therapeutic activity, Neuromuscular re-education, Balance training, Gait training, Patient/Family education, Self Care, Stair training, Orthotic/Fit training, DME instructions, and Aquatic Therapy  PLAN FOR NEXT SESSION:  how did new HEP go? Continue with mat exercises; ball exercises    Bisma Klett, [place], PT 05/23/2024, 7:47 PM

## 2024-05-26 ENCOUNTER — Ambulatory Visit: Payer: Self-pay | Admitting: Physical Therapy

## 2024-05-26 DIAGNOSIS — R2689 Other abnormalities of gait and mobility: Secondary | ICD-10-CM | POA: Diagnosis not present

## 2024-05-26 DIAGNOSIS — M6281 Muscle weakness (generalized): Secondary | ICD-10-CM

## 2024-05-26 NOTE — Therapy (Unsigned)
 OUTPATIENT PHYSICAL THERAPY NEURO TREATMENT NOTE     Patient Name: [name] MRN: [id number] DOB:[birth year], 63 y.o., female Today's Date: 05/27/2024   PCP: [place], [name]., MD REFERRING PROVIDER: Babs Arthea DASEN, MD  END OF SESSION:  PT End of Session - 05/27/24 1450     Visit Number 17    Number of Visits 28    Date for PT Re-Evaluation 06/10/24    Authorization Type [employer]    Authorization Time Period 02-16-24 -04-25-24:  04-19-24 - 06-26-24    Progress Note Due on Visit 20    PT Start Time 0933    PT Stop Time 1016    PT Time Calculation (min) 43 min    Activity Tolerance Patient tolerated treatment well    Behavior During Therapy [facility] for tasks assessed/performed                          Past Medical History:  Diagnosis Date   Anxiety    Aortic atherosclerosis (HCC) 04/16/2021   Atypical angina (HCC)    Back pain    related to spinal stenosis and disc problem, radiates down left buttocks to leg., weakness occ.   Chest pain    a. 03/2015 Cath: nl cors; b. 03/2021 Cor CTA: Ca2+ = 0. Nl Cors.   Chronic pain syndrome    Dyspnea    hx   GERD (gastroesophageal reflux disease)    Grade I diastolic dysfunction    Headache    Hyperlipidemia    Hypertension    Lumbar post-laminectomy syndrome    LVH (left ventricular hypertrophy) 12/15/2020   a. 11/2020 Echo: EF 65-70%, no rwma, sev asymm LVH with IVSd 1.9 cm. No LVOT obs @ rest. Gr1 DD. Triv MR.   PONV (postoperative nausea and vomiting)    Pulmonary nodules    a. 03/2021 CT Chest: 3mm pulm nodules in bilat lower lobes. F/u 1 yr.   Right foot drop    Syncope    a. 11/2020 Zio: No significant arrhythmias.   Vaginal foreign object    Uses Femring    Past Surgical History:  Procedure Laterality Date   ABDOMINAL HYSTERECTOMY     ANTERIOR CERVICAL DECOMP/DISCECTOMY FUSION N/A 01/03/2022   Procedure: Anterior Cervical Decompression Fusion - Cervical four-Cervical five - Cervical  five-Cervical six;  Surgeon: Joshua Alm RAMAN, MD;  Location: [facility] OR;  Service: Neurosurgery;  Laterality: N/A;   BIOPSY  12/16/2020   Procedure: BIOPSY;  Surgeon: Aneita Gwendlyn DASEN, MD;  Location: [facility] ENDOSCOPY;  Service: Endoscopy;;   CARDIAC CATHETERIZATION N/A 04/18/2015   Procedure: Left Heart Cath and Coronary Angiography;  Surgeon: Rober Chroman, MD;  Location: [facility] INVASIVE CV LAB;  Service: Cardiovascular;  Laterality: N/A;   COLONOSCOPY     COLONOSCOPY W/ BIOPSIES AND POLYPECTOMY  2018   ESOPHAGOGASTRODUODENOSCOPY (EGD) WITH PROPOFOL  N/A 12/16/2020   Procedure: ESOPHAGOGASTRODUODENOSCOPY (EGD) WITH PROPOFOL ;  Surgeon: Aneita Gwendlyn DASEN, MD;  Location: [facility] ENDOSCOPY;  Service: Endoscopy;  Laterality: N/A;   FOOT SURGERY Bilateral    [facility] bunion,bone spur, tendon (1) -6'16, (1)-10'16   HEMATOMA EVACUATION N/A 01/05/2022   Procedure: Cervical Wound Exploration;  Surgeon: Gillie Duncans, MD;  Location: [facility] OR;  Service: Neurosurgery;  Laterality: N/A;   IR EPIDUROGRAPHY  07/21/2018   LUMBAR LAMINECTOMY/DECOMPRESSION MICRODISCECTOMY Bilateral 12/28/2015   Procedure: MICRO LUMBAR DECOMPRESSION L4 - L5 BILATERALLY;  Surgeon: Reyes Billing, MD;  Location: [facility] ORS;  Service: Orthopedics;  Laterality:  Bilateral;   LUMBAR LAMINECTOMY/DECOMPRESSION MICRODISCECTOMY Bilateral 03/04/2018   Procedure: Revision of Microlumbar Decompression Bilateral Lumbar Four-Five;  Surgeon: Duwayne Purchase, MD;  Location: [facility] OR;  Service: Orthopedics;  Laterality: Bilateral;  90 mins   LUMBAR WOUND DEBRIDEMENT N/A 12/25/2023   Procedure: Irrigation and debridement of lumbar wound;  Surgeon: Joshua Alm Hamilton, MD;  Location: [facility] OR;  Service: Neurosurgery;  Laterality: N/A;   POSTERIOR FUSION PEDICLE SCREW PLACEMENT N/A 12/07/2023   Procedure: LUMBAR FOUR-FIVE POSTERIOR LATERAL FUSION;  Surgeon: Joshua Alm Hamilton, MD;  Location: [facility] OR;  Service: Neurosurgery;  Laterality: N/A;   SAVORY DILATION N/A 12/16/2020    Procedure: SAVORY DILATION;  Surgeon: Aneita Gwendlyn DASEN, MD;  Location: [facility] ENDOSCOPY;  Service: Endoscopy;  Laterality: N/A;   SPINAL CORD STIMULATOR INSERTION N/A 09/28/2019   Procedure: THORACIC SPINAL CORD STIMULATOR INSERTION;  Surgeon: Burnetta Aures, MD;  Location: [facility] OR;  Service: Orthopedics;  Laterality: N/A;  2.5 hrs   SPINAL CORD STIMULATOR REMOVAL N/A 05/27/2021   Procedure: LUMBAR SPINAL CORD STIMULATOR REMOVAL;  Surgeon: Bluford Standing, MD;  Location: [facility] ORS;  Service: Neurosurgery;  Laterality: N/A;   TUBAL LIGATION     WISDOM TOOTH EXTRACTION     WOUND EXPLORATION N/A 03/04/2018   Procedure: EXPLORATION OF LUMBAR DECOMPRESSION WOUND;  Surgeon: Duwayne Purchase, MD;  Location: [facility] OR;  Service: Orthopedics;  Laterality: N/A;   Patient Active Problem List   Diagnosis Date Noted   Vitamin D  deficiency, unspecified 04/13/2024   Seasonal allergic rhinitis due to pollen 01/29/2024   Delayed wound healing 12/25/2023   S/P lumbar fusion 12/07/2023   Pain in thumb joint with movement of right hand 08/12/2023   Localized primary osteoarthritis of carpometacarpal joint of right thumb 07/29/2023   Right foot drop 07/29/2023   Frequent falls 04/14/2023   Gastroesophageal reflux disease 04/14/2023   Right upper extremity numbness 09/29/2022   Stage 3a chronic kidney disease (HCC) 09/29/2022   Cervical spondylosis with radiculopathy 01/05/2022   S/P cervical spinal fusion 01/03/2022   Cervical disc disorder with radiculopathy of cervical region 11/27/2021   Uncontrolled pain 05/27/2021   Syncope 05/13/2021   Depression with anxiety 05/13/2021   Chronic diastolic CHF (congestive heart failure) (HCC) 05/13/2021   Chronic back pain 05/13/2021   Chest tightness 05/13/2021   Lower abdominal pain 05/13/2021   Syncope and collapse 01/01/2021   Abnormal echocardiogram 01/01/2021   BPPV (benign paroxysmal positional vertigo), left 12/26/2020   Odynophagia    Dysphagia 12/15/2020   Postural  dizziness with presyncope 12/14/2020   Lumbar post-laminectomy syndrome    Chronic pain 09/28/2019   Lumbar spinal stenosis    Radicular pain    Gait disorder    Difficulty in urination 07/13/2018   Back pain 07/12/2018   Hyperlipidemia 04/16/2018   GAD (generalized anxiety disorder) 04/16/2018   AKI (acute kidney injury) (HCC)    Benign essential HTN    Major depression, recurrent (HCC)    Lumbar radiculopathy 03/09/2018   Paraparesis (HCC) 03/09/2018   Essential hypertension    Chronic nonintractable headache    Leukocytosis    Acute blood loss anemia    Postoperative pain    Generalized weakness    Spinal stenosis at L4-L5 level 03/04/2018   Spinal stenosis of lumbar region 12/28/2015   Chest pain 04/16/2015    ONSET DATE: Most recent Surgery date 12-07-23:  Referral date 02-03-24  REFERRING DIAG:  M54.16 (ICD-10-CM) - Lumbar radiculopathy M96.1 (ICD-10-CM) - Lumbar post-laminectomy syndrome R26.9 (ICD-10-CM) -  Gait disorder  THERAPY DIAG:  Muscle weakness (generalized)  Other abnormalities of gait and mobility  Rationale for Evaluation and Treatment: Rehabilitation  SUBJECTIVE:                                                                                                                                                                                             SUBJECTIVE STATEMENT: Pt reports exercises done on Monday were very beneficial and provided pain relief; pt states she hasn't done any of the exercises at home this week because she received some bad news regarding her sister's health and it made her depressed - states she just couldn't get motivated to do the exercises, but states she will try to do them this weekend Pt accompanied by: self  PERTINENT HISTORY: The patient was admitted on 12/07/2023 &  underwent PLIF L4-5. Hospitalized 12-07-23 - 12-11-23:  The patient was admitted on 12/25/2023 and taken to the operating room where the patient underwent irrigation and  debridement of lumbar wound with primary closure and placement of external wound VAC- hospitalized 12-25-23 -  12-26-23  Frequent falls:  Spinal cord stimulator insertion on 09-28-19; stimulator removal on 05-27-21 with resultant LLE weakness;  decompression and laminectomy on 12/28/15 at L4-L5  with redo decompression surgery on May 9,2019, post-op paraparesis with surgery to remove small hematoma; 07/12/18 fall at work (involved rolling chair) with hospital admission, transfer to CIR with d/c home 08/03/18; HTN, Bil foot surgery; left ventricular hypertrophy, chronic pain syndrome, dyspnea, hypertrophic cardiomyopathy; cervical fusion March 2023;  had carpal tunnel surgery RUE in April 2024:  LVH  PAIN:  Are you having pain? Pt rates pain 5/10 in lumbar region - greater pain in lower lumbar region near sacral area- same burning sensation Alleviating Factor:  placing pillow behind back when sitting Aggravating Factors: certain movements    PRECAUTIONS: Fall; No bending/lifting or twisting; pt states she has a reacher  WEIGHT BEARING RESTRICTIONS: No  FALLS: Has patient fallen in last 6 months? Yes. Number of falls 3  LIVING ENVIRONMENT: Lives with: lives with their spouse Lives in: House/apartment Stairs: Yes: External: 5 steps; on right going up Has following equipment at home: Single point cane, Wheelchair (manual), and L[place]strand crutch  PLOF: Independent with basic ADLs, Independent with household mobility with device, Independent with community mobility with device, and Independent with transfers  PATIENT GOALS: improve walking and strength Rt leg  OBJECTIVE:   DIAGNOSTIC FINDINGS: IMPRESSION: 1. Interval anterior discectomy and fusion from C4 through C6 with improved patency of the spinal canal and only mild residual foraminal narrowing. 2. Stable mild spinal stenosis and mild  to moderate left foraminal narrowing at C3-4. 3. Stable mild spinal stenosis and mild foraminal  narrowing bilaterally at C6-7. Mild foraminal narrowing bilaterally at C7-T1. 4. No cord deformity or abnormal cord signal.  No acute findings. No recent MRI of lumbar region  COGNITION: Overall cognitive status: Within functional limits for tasks assessed   SENSATION: WFL  COORDINATION: Decreased RLE due to weakness and spasticity  POSTURE: No Significant postural limitations  LOWER EXTREMITY ROM:   LLE WFL's;  RLE WFL's except for ankle ROM which is minimal due to weakness; Pt has difficulty performing Rt knee extension due to Rt quad weakness and spasticity - turned body slightly to Lt to achieve more knee extension   LOWER EXTREMITY MMT:    MMT Right Eval Left Eval  Hip flexion 3- 3+   Hip extension    Hip abduction    Hip adduction    Hip internal rotation    Hip external rotation    Knee flexion 3+   Knee extension 4- 4  Ankle dorsiflexion 2-   Ankle plantarflexion    Ankle inversion    Ankle eversion    (Blank rows = not tested)  BED MOBILITY:  Modified independent  TRANSFERS: Assistive device utilized: Rollator  Sit to stand: Modified independence Stand to sit: Modified independence   STAIRS:  Not tested due to c/o back pain   GAIT: Gait pattern: decreased step length- Right, decreased stance time- Right, decreased hip/knee flexion- Right, and decreased ankle dorsiflexion- Right Distance walked: 50' Assistive device utilized: with rollator Level of assistance: SBA and CGA Comments: slow gait speed due to c/o back pain and RLE weakness  FUNCTIONAL TESTS:  Sit to stand score - 2 reps - with bil. UE support from chair- 29.91 secs             TUG score: - 49.19 secs with rollator  Gait velocity:  37.38 secs = .88 ft/sec with rollator  PATIENT SURVEYS:  N/A - based on diagnosis of RLE weakness due to chronic lumbar radiculopathy   TODAY'S TREATMENT: 05-26-24                                                                                                                            Gait: Pt using rollator and wearing Blue rocker AFO on RLE - pt modified independent with use of rollator    TherEx:  Pt performed following exercises in supine position to reduce back pain and to increase strength in low back musc. and ROM of lumbar spine:   Pelvic tilt 10 reps Pelvic tilt with hip flexion with knee flexed in hooklying position 10 reps each leg RLE - heel slide - with pelvic tilt prior to flexing RLE in supine-10 reps Trunk rotation to Lt side 10 times - hold 3 secs Bridging - small range -10 reps Smallrange push ups onto forearms 10 reps Cat/cow in quadruped - 5 reps - pt reported increased pain with this  exercise and requested ice pack; pt lied in hooklying position with bolster placed under knees - ice pack applied to low back; pt performed pelvic tilts 10 reps in supine, while lying on ice pack   Pt rolled supine to Rt sidelying to prone; performed prone on elbows - small range push ups with approx. 5 sec hold, 5 reps to increase thoracic extension Cues to roll to Lt sidelying for transferring to sitting position using log roll technique  Scifit level 2.5 - bil. UE and LE's x 10;  ice pack applied to low back for pain management   Medbridge:   Access Code: [email or web address] URL: [email or web address] Date: 05/23/2024 Prepared by: Rock Kussmaul  Exercises - Supine Posterior Pelvic Tilt  - 1 x daily - 7 x weekly - 1 sets - 10 reps - 5 sec hold - Supine Heel Slides  - 1 x daily - 7 x weekly - 1 sets - 10 reps - Lifting leg up/down in this position  - 1 x daily - 7 x weekly - 1 sets - 10 reps - Supine Lower Trunk Rotation  - 1 x daily - 7 x weekly - 1 sets - 5 reps - Supine Bridge with Pelvic Floor Contraction  - 1 x daily - 7 x weekly - 1 sets - 5 reps - 3 sec hold   UPDATED HEP: Access Code: [email or web address] URL: [email or web address] Date: 04/27/2024 Prepared by: Rock Kussmaul  Exercises - Seated Knee Extension  with Resistance  - 1 x daily - 7 x weekly - 3 sets - 10 reps - TAP UPS - with Left leg   - 1 x daily - 7 x weekly - 3 sets - 10 reps - Standing March with Counter Support  - 1 x daily - 7 x weekly - 3 sets - 10 reps - Forward Step Up with Counter Support  - 1 x daily - 7 x weekly - 3 sets - 10 reps - Alternating Step Forward with Support  - 1 x daily - 7 x weekly - 3 sets - 10 reps   PATIENT EDUCATION: Education details:  Medbridge  Person educated: Patient Education method: Explanation - handout declined as pt stated she has it from previous OP admission Education comprehension: verbalized understanding  HOME EXERCISE PROGRAM:   GOALS: Goals reviewed with patient? Yes  LONG TERM GOALS: Target date: 04-15-24  Amb. 230' with RW with SBA without requiring seated rest period. Baseline: 134' x 1, 87' x 1 rep Goal status: Not met 04-19-24  2.  Improve TUG score to </= 28 secs  with use of RW for improved mobility and reduced fall risk. Baseline: 49.19 secs with rollator;  28.5 secs with rollator Goal status: Partially met 04-19-24  3.  Perform 5x sit to stand transfers with LUE support only to demo improved strength in RLE.  Baseline: 36.16 secs - 4x with bil. UE support from chair - had to stop due to burning sensation in low back Goal status: Not met 04-19-24  4.  Modified independent step negotiation with use of bil. Hand rails using step by step sequence. Baseline:  Goal status: Goal met 04-19-24  5.  Increase gait velocity to >/= 1.6 ft/sec with rollator for increased gait efficiency with reduced fall risk.          Baseline: 37.38 secs = .88 ft/sec with rollator;  33.44 secs = .98 ft/sec with rollator          Goal status:  Not met  04-19-24   6.  Independent in updated HEP for RLE strengthening. Baseline:  Goal status:  Goal met 04-19-24   7.  Report reduced low back pain at rest to </= 3/10 intensity.            Baseline:  7/10;    04-20-24 -- 5/10            Goal status:   Not met - 04-19-24   UPDATED STG'S:  TARGET DATE 05-13-24  1.  Amb. 230' with RW with SBA without requiring seated rest period.             Baseline: 134' x 1, 87' x 1 rep on 04-19-24     Goal status:  Ongoing; NT in today's session due to c/o Rt hand pain due to recurrence of CTS   2.  Improve TUG score to </= 26 secs  with use of RW for improved mobility and reduced fall risk. Baseline: 49.19 secs with rollator;  28.5 secs with rollator Goal status:  In progress  3.  Perform 5x sit to stand from chair with bil. UE support in </= 30 secs to demonstrate improved mobility.     Baseline:  36.16 secs - 4 reps with bil. UE support from chair    Goal status:  In progress - not tested in today's session due to c/o Rt hand pain due to recurrence of CTS     4.  Pt will report reduced pain in low back to </= 4/10 intensity for increased ease with ADL's and mobility.    Baseline:  5/10 on 04-19-24    Goal status:  Goal met 05-12-24  UPDATED LONG TERM GOALS: Target date: 06-10-24   Amb. 350' with RW with SBA without requiring seated rest period. Baseline: 134' x 1, 87' x 1 rep - 04-19-24 Goal status: NEW  2.  Improve TUG score to </= 22 secs  with use of RW for improved mobility and reduced fall risk. Baseline: 49.19 secs with rollator;  28.5 secs with rollator - 04-19-24 Goal status: NEW  3.  Perform 5x sit to stand transfers with LUE support only to demo improved strength in RLE IN </= 25 secs to demo improved functional mobility. Baseline: 36.16 secs - 4x with bil. UE support from chair - had to stop due to burning sensation in low back Goal status: NEW  4.  Increase gait velocity to >/= 1.6 ft/sec with rollator for increased gait efficiency with reduced fall risk.          Baseline: 37.38 secs = .88 ft/sec with rollator;  33.44 secs = .98 ft/sec with rollator          Goal status:  ONGOING   6.  Independent in updated HEP for RLE strengthening. Baseline:  Goal status:  ONGOING   7.   Report reduced low back pain at rest to </= 3/10 intensity.            Baseline:  7/10;    04-20-24 -- 5/10            Goal status:  ONGOING   ASSESSMENT:  CLINICAL IMPRESSION: PT session focused on gentle low back strengthening and stretching exercises.  Pt reported reduced pain at start of today's session with pain intensity rating of 5/10.  Pt reported pain with gentle cat/cow exercise in quadruped position so this exercise was discontinued after approx. 5 reps and ice pack applied with pt performing pelvic tilt exercise, while lying  on ice pack.  Pt reported significant decrease in pain with this combination of pelvic tilt exercise and ice pack.  Pt requested to perform SciFit exercise at end of session as has been performed in previous sessions.  Pt reported no increased pain at end of session, after SciFit exercise.  Cont with POC.   OBJECTIVE IMPAIRMENTS: Abnormal gait, decreased balance, decreased coordination, decreased endurance, decreased strength, and impaired tone.  ACTIVITY LIMITATIONS: carrying, bending, squatting, stairs, transfers, and locomotion level  PARTICIPATION LIMITATIONS: cleaning, laundry, shopping, and community activity  PERSONAL FACTORS: Behavior pattern, Past/current experiences, and Time since onset of injury/illness/exacerbation are also affecting patient's functional outcome.   REHAB POTENTIAL: Good  CLINICAL DECISION MAKING: Evolving/moderate complexity  EVALUATION COMPLEXITY: Moderate  PLAN:  PT FREQUENCY: 2x/week  PT DURATION: 8 weeks  PLANNED INTERVENTIONS: Therapeutic exercises, Therapeutic activity, Neuromuscular re-education, Balance training, Gait training, Patient/Family education, Self Care, Stair training, Orthotic/Fit training, DME instructions, and Aquatic Therapy  PLAN FOR NEXT SESSION:  how did new HEP go? Continue with mat exercises; ball exercises    Errin [place], [place], PT 05/27/2024, 4:59 PM

## 2024-05-27 ENCOUNTER — Other Ambulatory Visit (HOSPITAL_COMMUNITY): Payer: Self-pay

## 2024-05-27 ENCOUNTER — Other Ambulatory Visit: Payer: Self-pay

## 2024-05-27 ENCOUNTER — Encounter: Payer: Self-pay | Admitting: Physical Therapy

## 2024-05-30 ENCOUNTER — Other Ambulatory Visit (HOSPITAL_COMMUNITY): Payer: Self-pay

## 2024-05-31 ENCOUNTER — Other Ambulatory Visit (HOSPITAL_COMMUNITY): Payer: Self-pay

## 2024-05-31 ENCOUNTER — Ambulatory Visit: Payer: Self-pay | Attending: Family Medicine | Admitting: Physical Therapy

## 2024-05-31 DIAGNOSIS — M5459 Other low back pain: Secondary | ICD-10-CM | POA: Diagnosis not present

## 2024-05-31 DIAGNOSIS — M6281 Muscle weakness (generalized): Secondary | ICD-10-CM | POA: Insufficient documentation

## 2024-05-31 MED ORDER — BETAMETHASONE DIPROPIONATE 0.05 % EX CREA
1.0000 | TOPICAL_CREAM | Freq: Every day | CUTANEOUS | 0 refills | Status: DC
  Filled 2024-05-31 (×2): qty 45, 30d supply, fill #0

## 2024-05-31 NOTE — Therapy (Unsigned)
 OUTPATIENT PHYSICAL THERAPY NEURO TREATMENT NOTE     Patient Name: [name] MRN: [id number] DOB:[birth year], 63 y.o., female Today's Date: 06/01/2024   PCP: [place], [name]., MD REFERRING PROVIDER: Babs Arthea DASEN, MD  END OF SESSION:  PT End of Session - 06/01/24 1845     Visit Number 18    Number of Visits 28    Date for PT Re-Evaluation 06/10/24    Authorization Type [employer]    Authorization Time Period 02-16-24 -04-25-24:  04-19-24 - 06-26-24    Progress Note Due on Visit 20    PT Start Time 1102    PT Stop Time 1148    PT Time Calculation (min) 46 min    Activity Tolerance Patient tolerated treatment well    Behavior During Therapy [facility] for tasks assessed/performed                           Past Medical History:  Diagnosis Date   Anxiety    Aortic atherosclerosis (HCC) 04/16/2021   Atypical angina (HCC)    Back pain    related to spinal stenosis and disc problem, radiates down left buttocks to leg., weakness occ.   Chest pain    a. 03/2015 Cath: nl cors; b. 03/2021 Cor CTA: Ca2+ = 0. Nl Cors.   Chronic pain syndrome    Dyspnea    hx   GERD (gastroesophageal reflux disease)    Grade I diastolic dysfunction    Headache    Hyperlipidemia    Hypertension    Lumbar post-laminectomy syndrome    LVH (left ventricular hypertrophy) 12/15/2020   a. 11/2020 Echo: EF 65-70%, no rwma, sev asymm LVH with IVSd 1.9 cm. No LVOT obs @ rest. Gr1 DD. Triv MR.   PONV (postoperative nausea and vomiting)    Pulmonary nodules    a. 03/2021 CT Chest: 3mm pulm nodules in bilat lower lobes. F/u 1 yr.   Right foot drop    Syncope    a. 11/2020 Zio: No significant arrhythmias.   Vaginal foreign object    Uses Femring    Past Surgical History:  Procedure Laterality Date   ABDOMINAL HYSTERECTOMY     ANTERIOR CERVICAL DECOMP/DISCECTOMY FUSION N/A 01/03/2022   Procedure: Anterior Cervical Decompression Fusion - Cervical four-Cervical five - Cervical  five-Cervical six;  Surgeon: Joshua Alm RAMAN, MD;  Location: [facility] OR;  Service: Neurosurgery;  Laterality: N/A;   BIOPSY  12/16/2020   Procedure: BIOPSY;  Surgeon: Aneita Gwendlyn DASEN, MD;  Location: [facility] ENDOSCOPY;  Service: Endoscopy;;   CARDIAC CATHETERIZATION N/A 04/18/2015   Procedure: Left Heart Cath and Coronary Angiography;  Surgeon: Rober Chroman, MD;  Location: [facility] INVASIVE CV LAB;  Service: Cardiovascular;  Laterality: N/A;   COLONOSCOPY     COLONOSCOPY W/ BIOPSIES AND POLYPECTOMY  2018   ESOPHAGOGASTRODUODENOSCOPY (EGD) WITH PROPOFOL  N/A 12/16/2020   Procedure: ESOPHAGOGASTRODUODENOSCOPY (EGD) WITH PROPOFOL ;  Surgeon: Aneita Gwendlyn DASEN, MD;  Location: [facility] ENDOSCOPY;  Service: Endoscopy;  Laterality: N/A;   FOOT SURGERY Bilateral    [facility] bunion,bone spur, tendon (1) -6'16, (1)-10'16   HEMATOMA EVACUATION N/A 01/05/2022   Procedure: Cervical Wound Exploration;  Surgeon: Gillie Duncans, MD;  Location: [facility] OR;  Service: Neurosurgery;  Laterality: N/A;   IR EPIDUROGRAPHY  07/21/2018   LUMBAR LAMINECTOMY/DECOMPRESSION MICRODISCECTOMY Bilateral 12/28/2015   Procedure: MICRO LUMBAR DECOMPRESSION L4 - L5 BILATERALLY;  Surgeon: Reyes Billing, MD;  Location: [facility] ORS;  Service: Orthopedics;  Laterality: Bilateral;   LUMBAR LAMINECTOMY/DECOMPRESSION MICRODISCECTOMY Bilateral 03/04/2018   Procedure: Revision of Microlumbar Decompression Bilateral Lumbar Four-Five;  Surgeon: Duwayne Purchase, MD;  Location: [facility] OR;  Service: Orthopedics;  Laterality: Bilateral;  90 mins   LUMBAR WOUND DEBRIDEMENT N/A 12/25/2023   Procedure: Irrigation and debridement of lumbar wound;  Surgeon: Joshua Alm Hamilton, MD;  Location: [facility] OR;  Service: Neurosurgery;  Laterality: N/A;   POSTERIOR FUSION PEDICLE SCREW PLACEMENT N/A 12/07/2023   Procedure: LUMBAR FOUR-FIVE POSTERIOR LATERAL FUSION;  Surgeon: Joshua Alm Hamilton, MD;  Location: [facility] OR;  Service: Neurosurgery;  Laterality: N/A;   SAVORY DILATION N/A 12/16/2020    Procedure: SAVORY DILATION;  Surgeon: Aneita Gwendlyn DASEN, MD;  Location: [facility] ENDOSCOPY;  Service: Endoscopy;  Laterality: N/A;   SPINAL CORD STIMULATOR INSERTION N/A 09/28/2019   Procedure: THORACIC SPINAL CORD STIMULATOR INSERTION;  Surgeon: Burnetta Aures, MD;  Location: [facility] OR;  Service: Orthopedics;  Laterality: N/A;  2.5 hrs   SPINAL CORD STIMULATOR REMOVAL N/A 05/27/2021   Procedure: LUMBAR SPINAL CORD STIMULATOR REMOVAL;  Surgeon: Bluford Standing, MD;  Location: [facility] ORS;  Service: Neurosurgery;  Laterality: N/A;   TUBAL LIGATION     WISDOM TOOTH EXTRACTION     WOUND EXPLORATION N/A 03/04/2018   Procedure: EXPLORATION OF LUMBAR DECOMPRESSION WOUND;  Surgeon: Duwayne Purchase, MD;  Location: [facility] OR;  Service: Orthopedics;  Laterality: N/A;   Patient Active Problem List   Diagnosis Date Noted   Vitamin D  deficiency, unspecified 04/13/2024   Seasonal allergic rhinitis due to pollen 01/29/2024   Delayed wound healing 12/25/2023   S/P lumbar fusion 12/07/2023   Pain in thumb joint with movement of right hand 08/12/2023   Localized primary osteoarthritis of carpometacarpal joint of right thumb 07/29/2023   Right foot drop 07/29/2023   Frequent falls 04/14/2023   Gastroesophageal reflux disease 04/14/2023   Right upper extremity numbness 09/29/2022   Stage 3a chronic kidney disease (HCC) 09/29/2022   Cervical spondylosis with radiculopathy 01/05/2022   S/P cervical spinal fusion 01/03/2022   Cervical disc disorder with radiculopathy of cervical region 11/27/2021   Uncontrolled pain 05/27/2021   Syncope 05/13/2021   Depression with anxiety 05/13/2021   Chronic diastolic CHF (congestive heart failure) (HCC) 05/13/2021   Chronic back pain 05/13/2021   Chest tightness 05/13/2021   Lower abdominal pain 05/13/2021   Syncope and collapse 01/01/2021   Abnormal echocardiogram 01/01/2021   BPPV (benign paroxysmal positional vertigo), left 12/26/2020   Odynophagia    Dysphagia 12/15/2020   Postural  dizziness with presyncope 12/14/2020   Lumbar post-laminectomy syndrome    Chronic pain 09/28/2019   Lumbar spinal stenosis    Radicular pain    Gait disorder    Difficulty in urination 07/13/2018   Back pain 07/12/2018   Hyperlipidemia 04/16/2018   GAD (generalized anxiety disorder) 04/16/2018   AKI (acute kidney injury) (HCC)    Benign essential HTN    Major depression, recurrent (HCC)    Lumbar radiculopathy 03/09/2018   Paraparesis (HCC) 03/09/2018   Essential hypertension    Chronic nonintractable headache    Leukocytosis    Acute blood loss anemia    Postoperative pain    Generalized weakness    Spinal stenosis at L4-L5 level 03/04/2018   Spinal stenosis of lumbar region 12/28/2015   Chest pain 04/16/2015    ONSET DATE: Most recent Surgery date 12-07-23:  Referral date 02-03-24  REFERRING DIAG:  M54.16 (ICD-10-CM) - Lumbar radiculopathy M96.1 (ICD-10-CM) - Lumbar post-laminectomy syndrome R26.9 (ICD-10-CM) -  Gait disorder  THERAPY DIAG:  Muscle weakness (generalized)  Other low back pain  Rationale for Evaluation and Treatment: Rehabilitation  SUBJECTIVE:                                                                                                                                                                                             SUBJECTIVE STATEMENT: Pt reports her back feels really tight today - thinks the weather (rainy) is making her hurt more today.  Wants to work on stretching her low back again Pt accompanied by: self  PERTINENT HISTORY: The patient was admitted on 12/07/2023 &  underwent PLIF L4-5. Hospitalized 12-07-23 - 12-11-23:  The patient was admitted on 12/25/2023 and taken to the operating room where the patient underwent irrigation and debridement of lumbar wound with primary closure and placement of external wound VAC- hospitalized 12-25-23 -  12-26-23  Frequent falls:  Spinal cord stimulator insertion on 09-28-19; stimulator removal on 05-27-21 with  resultant LLE weakness;  decompression and laminectomy on 12/28/15 at L4-L5  with redo decompression surgery on May 9,2019, post-op paraparesis with surgery to remove small hematoma; 07/12/18 fall at work (involved rolling chair) with hospital admission, transfer to CIR with d/c home 08/03/18; HTN, Bil foot surgery; left ventricular hypertrophy, chronic pain syndrome, dyspnea, hypertrophic cardiomyopathy; cervical fusion March 2023;  had carpal tunnel surgery RUE in April 2024:  LVH  PAIN:  Are you having pain? Pt rates pain 5/10 in lumbar region - greater pain in lower lumbar region near sacral area- same burning sensation Alleviating Factor:  placing pillow behind back when sitting Aggravating Factors: certain movements    PRECAUTIONS: Fall; No bending/lifting or twisting; pt states she has a reacher  WEIGHT BEARING RESTRICTIONS: No  FALLS: Has patient fallen in last 6 months? Yes. Number of falls 3  LIVING ENVIRONMENT: Lives with: lives with their spouse Lives in: House/apartment Stairs: Yes: External: 5 steps; on right going up Has following equipment at home: Single point cane, Wheelchair (manual), and [place]strand crutch  PLOF: Independent with basic ADLs, Independent with household mobility with device, Independent with community mobility with device, and Independent with transfers  PATIENT GOALS: improve walking and strength Rt leg  OBJECTIVE:   DIAGNOSTIC FINDINGS: IMPRESSION: 1. Interval anterior discectomy and fusion from C4 through C6 with improved patency of the spinal canal and only mild residual foraminal narrowing. 2. Stable mild spinal stenosis and mild to moderate left foraminal narrowing at C3-4. 3. Stable mild spinal stenosis and mild foraminal narrowing bilaterally at C6-7. Mild foraminal narrowing bilaterally at C7-T1. 4. No cord deformity or abnormal cord signal.  No  acute findings. No recent MRI of lumbar region  COGNITION: Overall cognitive status: Within  functional limits for tasks assessed   SENSATION: WFL  COORDINATION: Decreased RLE due to weakness and spasticity  POSTURE: No Significant postural limitations  LOWER EXTREMITY ROM:   LLE WFL's;  RLE WFL's except for ankle ROM which is minimal due to weakness; Pt has difficulty performing Rt knee extension due to Rt quad weakness and spasticity - turned body slightly to Lt to achieve more knee extension   LOWER EXTREMITY MMT:    MMT Right Eval Left Eval  Hip flexion 3- 3+   Hip extension    Hip abduction    Hip adduction    Hip internal rotation    Hip external rotation    Knee flexion 3+   Knee extension 4- 4  Ankle dorsiflexion 2-   Ankle plantarflexion    Ankle inversion    Ankle eversion    (Blank rows = not tested)  BED MOBILITY:  Modified independent  TRANSFERS: Assistive device utilized: Rollator  Sit to stand: Modified independence Stand to sit: Modified independence   STAIRS:  Not tested due to c/o back pain   GAIT: Gait pattern: decreased step length- Right, decreased stance time- Right, decreased hip/knee flexion- Right, and decreased ankle dorsiflexion- Right Distance walked: 50' Assistive device utilized: with rollator Level of assistance: SBA and CGA Comments: slow gait speed due to c/o back pain and RLE weakness  FUNCTIONAL TESTS:  Sit to stand score - 2 reps - with bil. UE support from chair- 29.91 secs             TUG score: - 49.19 secs with rollator  Gait velocity:  37.38 secs = .88 ft/sec with rollator  PATIENT SURVEYS:  N/A - based on diagnosis of RLE weakness due to chronic lumbar radiculopathy   TODAY'S TREATMENT: 05-31-24                                                                                                                           Gait: Pt using rollator and wearing Blue rocker AFO on RLE - pt modified independent with use of rollator  Pt amb. Clinic distances with rollator with supervision  TherEx:  Pt performed  following exercises in supine position to reduce back pain and to increase strength in low back musc. and ROM of lumbar spine:   Pelvic tilt 10 reps 5 sec hold Pelvic tilt with heel slide - 5 reps RLE:  LLE 10 reps Bridging small range 7 reps - stopped after 7 reps due to c/o back pain RLE - heel slide - with pelvic tilt prior to flexing RLE in supine-10 reps LE's placed on red physioball - pt performed hip and knee flexion toward chest for low back stretching - small range due to c/o tightness in low back - 10 reps Trunk rotation to Lt side 10 times - hold 3 secs Pelvic tilt with hip flexion in hooklying -  RLE 5 reps x 2 sets;  LLE 5 reps Seated position on side of mat - performed low back stretching by rolling red physioball forward with 5 sec hold x 5 reps  Seated pelvic rocking for increased pelvic mobility and low back stretching  Scifit level 2.5 - bil. UE and LE's x 10;  ice pack applied to low back for pain management =========================    Medbridge:   Access Code: [email or web address] URL: [email or web address] Date: 05/23/2024 Prepared by: Rock Kussmaul  Exercises - Supine Posterior Pelvic Tilt  - 1 x daily - 7 x weekly - 1 sets - 10 reps - 5 sec hold - Supine Heel Slides  - 1 x daily - 7 x weekly - 1 sets - 10 reps - Lifting leg up/down in this position  - 1 x daily - 7 x weekly - 1 sets - 10 reps - Supine Lower Trunk Rotation  - 1 x daily - 7 x weekly - 1 sets - 5 reps - Supine Bridge with Pelvic Floor Contraction  - 1 x daily - 7 x weekly - 1 sets - 5 reps - 3 sec hold   UPDATED HEP: Access Code: [email or web address] URL: [email or web address] Date: 04/27/2024 Prepared by: Rock Kussmaul  Exercises - Seated Knee Extension with Resistance  - 1 x daily - 7 x weekly - 3 sets - 10 reps - TAP UPS - with Left leg   - 1 x daily - 7 x weekly - 3 sets - 10 reps - Standing March with Counter Support  - 1 x daily - 7 x weekly - 3 sets - 10 reps - Forward Step Up  with Counter Support  - 1 x daily - 7 x weekly - 3 sets - 10 reps - Alternating Step Forward with Support  - 1 x daily - 7 x weekly - 3 sets - 10 reps   PATIENT EDUCATION: Education details:  Medbridge  Person educated: Patient Education method: Explanation - handout declined as pt stated she has it from previous OP admission Education comprehension: verbalized understanding  HOME EXERCISE PROGRAM:   GOALS: Goals reviewed with patient? Yes  LONG TERM GOALS: Target date: 04-15-24  Amb. 230' with RW with SBA without requiring seated rest period. Baseline: 134' x 1, 87' x 1 rep Goal status: Not met 04-19-24  2.  Improve TUG score to </= 28 secs  with use of RW for improved mobility and reduced fall risk. Baseline: 49.19 secs with rollator;  28.5 secs with rollator Goal status: Partially met 04-19-24  3.  Perform 5x sit to stand transfers with LUE support only to demo improved strength in RLE.  Baseline: 36.16 secs - 4x with bil. UE support from chair - had to stop due to burning sensation in low back Goal status: Not met 04-19-24  4.  Modified independent step negotiation with use of bil. Hand rails using step by step sequence. Baseline:  Goal status: Goal met 04-19-24  5.  Increase gait velocity to >/= 1.6 ft/sec with rollator for increased gait efficiency with reduced fall risk.          Baseline: 37.38 secs = .88 ft/sec with rollator;  33.44 secs = .98 ft/sec with rollator          Goal status:  Not met 04-19-24   6.  Independent in updated HEP for RLE strengthening. Baseline:  Goal status:  Goal met 04-19-24   7.  Report reduced low back pain at rest to </=  3/10 intensity.            Baseline:  7/10;    04-20-24 -- 5/10            Goal status:  Not met - 04-19-24   UPDATED STG'S:  TARGET DATE 05-13-24  1.  Amb. 230' with RW with SBA without requiring seated rest period.             Baseline: 134' x 1, 87' x 1 rep on 04-19-24     Goal status:  Ongoing; NT in today's session  due to c/o Rt hand pain due to recurrence of CTS   2.  Improve TUG score to </= 26 secs  with use of RW for improved mobility and reduced fall risk. Baseline: 49.19 secs with rollator;  28.5 secs with rollator Goal status:  In progress  3.  Perform 5x sit to stand from chair with bil. UE support in </= 30 secs to demonstrate improved mobility.     Baseline:  36.16 secs - 4 reps with bil. UE support from chair    Goal status:  In progress - not tested in today's session due to c/o Rt hand pain due to recurrence of CTS     4.  Pt will report reduced pain in low back to </= 4/10 intensity for increased ease with ADL's and mobility.    Baseline:  5/10 on 04-19-24    Goal status:  Goal met 05-12-24  UPDATED LONG TERM GOALS: Target date: 06-10-24   Amb. 350' with RW with SBA without requiring seated rest period. Baseline: 134' x 1, 87' x 1 rep - 04-19-24 Goal status: NEW  2.  Improve TUG score to </= 22 secs  with use of RW for improved mobility and reduced fall risk. Baseline: 49.19 secs with rollator;  28.5 secs with rollator - 04-19-24 Goal status: NEW  3.  Perform 5x sit to stand transfers with LUE support only to demo improved strength in RLE IN </= 25 secs to demo improved functional mobility. Baseline: 36.16 secs - 4x with bil. UE support from chair - had to stop due to burning sensation in low back Goal status: NEW  4.  Increase gait velocity to >/= 1.6 ft/sec with rollator for increased gait efficiency with reduced fall risk.          Baseline: 37.38 secs = .88 ft/sec with rollator;  33.44 secs = .98 ft/sec with rollator          Goal status:  ONGOING   6.  Independent in updated HEP for RLE strengthening. Baseline:  Goal status:  ONGOING   7.  Report reduced low back pain at rest to </= 3/10 intensity.            Baseline:  7/10;    04-20-24 -- 5/10            Goal status:  ONGOING   ASSESSMENT:  CLINICAL IMPRESSION: PT session focused on gentle low back strengthening and  stretching exercises.  Pt reported increased tightness in low back with pain intensity rating 5/10, which she attributed increased pain to today's rainy weather.  Pt reported moderate stretch with pain relief with seated trunk flexion with rolling physioball forward with cues to keep back straight and also relief reported with pelvic rocking.   Cont with POC.   OBJECTIVE IMPAIRMENTS: Abnormal gait, decreased balance, decreased coordination, decreased endurance, decreased strength, and impaired tone.  ACTIVITY LIMITATIONS: carrying, bending, squatting, stairs, transfers,  and locomotion level  PARTICIPATION LIMITATIONS: cleaning, laundry, shopping, and community activity  PERSONAL FACTORS: Behavior pattern, Past/current experiences, and Time since onset of injury/illness/exacerbation are also affecting patient's functional outcome.   REHAB POTENTIAL: Good  CLINICAL DECISION MAKING: Evolving/moderate complexity  EVALUATION COMPLEXITY: Moderate  PLAN:  PT FREQUENCY: 2x/week  PT DURATION: 8 weeks  PLANNED INTERVENTIONS: Therapeutic exercises, Therapeutic activity, Neuromuscular re-education, Balance training, Gait training, Patient/Family education, Self Care, Stair training, Orthotic/Fit training, DME instructions, and Aquatic Therapy  PLAN FOR NEXT SESSION:  Continue with low back stretching; ball exercises    Kareema Keitt, [place], PT 06/01/2024, 6:49 PM

## 2024-06-01 ENCOUNTER — Encounter: Payer: Self-pay | Admitting: Physical Therapy

## 2024-06-02 ENCOUNTER — Ambulatory Visit: Payer: Self-pay | Admitting: Physical Therapy

## 2024-06-03 ENCOUNTER — Ambulatory Visit: Admitting: Adult Health

## 2024-06-03 ENCOUNTER — Encounter: Payer: Self-pay | Admitting: Adult Health

## 2024-06-03 ENCOUNTER — Ambulatory Visit

## 2024-06-03 ENCOUNTER — Other Ambulatory Visit (HOSPITAL_COMMUNITY): Payer: Self-pay

## 2024-06-03 VITALS — BP 140/90 | HR 59 | Temp 98.1°F | Ht 65.0 in

## 2024-06-03 DIAGNOSIS — I10 Essential (primary) hypertension: Secondary | ICD-10-CM | POA: Diagnosis not present

## 2024-06-03 DIAGNOSIS — J988 Other specified respiratory disorders: Secondary | ICD-10-CM

## 2024-06-03 DIAGNOSIS — R030 Elevated blood-pressure reading, without diagnosis of hypertension: Secondary | ICD-10-CM | POA: Diagnosis not present

## 2024-06-03 DIAGNOSIS — Z981 Arthrodesis status: Secondary | ICD-10-CM | POA: Diagnosis not present

## 2024-06-03 MED ORDER — PREDNISONE 20 MG PO TABS
20.0000 mg | ORAL_TABLET | Freq: Every day | ORAL | 0 refills | Status: DC
  Filled 2024-06-03 (×2): qty 7, 7d supply, fill #0

## 2024-06-03 MED ORDER — HYDROCODONE BIT-HOMATROP MBR 5-1.5 MG/5ML PO SOLN
5.0000 mL | Freq: Three times a day (TID) | ORAL | 0 refills | Status: DC | PRN
  Filled 2024-06-03 (×2): qty 120, 8d supply, fill #0

## 2024-06-03 NOTE — Progress Notes (Addendum)
 Subjective:    Patient ID: [name], female    DOB: [birth year], 63 y.o.   MRN: [id number]  HPI 63 year old female who  has a past medical history of Anxiety, Aortic atherosclerosis (HCC) (04/16/2021), Atypical angina (HCC), Back pain, Chest pain, Chronic pain syndrome, Dyspnea, GERD (gastroesophageal reflux disease), Grade I diastolic dysfunction, Headache, Hyperlipidemia, Hypertension, Lumbar post-laminectomy syndrome, LVH (left ventricular hypertrophy) (12/15/2020), PONV (postoperative nausea and vomiting), Pulmonary nodules, Right foot drop, Syncope, and Vaginal foreign object.  She presents to the office today for an acute visit.  She reports I feel miserable. She reports that for the last 7 days. Symptoms include that of a dry cough , chills ( resolved) , headaches, mild shortness of breath.  She has not had any fevers or noticed any wheezing  She has been using nyquil  and tylenol  without relief.   Review of Systems See HPI   Past Medical History:  Diagnosis Date   Anxiety    Aortic atherosclerosis (HCC) 04/16/2021   Atypical angina (HCC)    Back pain    related to spinal stenosis and disc problem, radiates down left buttocks to leg., weakness occ.   Chest pain    a. 03/2015 Cath: nl cors; b. 03/2021 Cor CTA: Ca2+ = 0. Nl Cors.   Chronic pain syndrome    Dyspnea    hx   GERD (gastroesophageal reflux disease)    Grade I diastolic dysfunction    Headache    Hyperlipidemia    Hypertension    Lumbar post-laminectomy syndrome    LVH (left ventricular hypertrophy) 12/15/2020   a. 11/2020 Echo: EF 65-70%, no rwma, sev asymm LVH with IVSd 1.9 cm. No LVOT obs @ rest. Gr1 DD. Triv MR.   PONV (postoperative nausea and vomiting)    Pulmonary nodules    a. 03/2021 CT Chest: 3mm pulm nodules in bilat lower lobes. F/u 1 yr.   Right foot drop    Syncope    a. 11/2020 Zio: No significant arrhythmias.   Vaginal foreign object    Uses Femring     Social History    Socioeconomic History   Marital status: Married    Spouse name: Rayne Sprang   Number of children: 1   Years of education: Not on file   Highest education level: Not on file  Occupational History   Not on file  Tobacco Use   Smoking status: Never   Smokeless tobacco: Never  Vaping Use   Vaping status: Never Used  Substance and Sexual Activity   Alcohol use: Never   Drug use: Never   Sexual activity: Yes    Partners: Male    Birth control/protection: Post-menopausal, Surgical    Comment: Hysterectomy  Other Topics Concern   Not on file  Social History Narrative   Not on file   Social Drivers of Health   Financial Resource Strain: Low Risk  (05/02/2024)   Overall Financial Resource Strain (CARDIA)    Difficulty of Paying Living Expenses: Not very hard  Food Insecurity: No Food Insecurity (05/02/2024)   Hunger Vital Sign    Worried About Running Out of Food in the Last Year: Never true    Ran Out of Food in the Last Year: Never true  Transportation Needs: No Transportation Needs (05/02/2024)   PRAPARE - Administrator, Civil Service (Medical): No    Lack of Transportation (Non-Medical): No  Physical Activity: Insufficiently Active (05/02/2024)   Exercise Vital Sign  Days of Exercise per Week: 2 days    Minutes of Exercise per Session: 30 min  Stress: No Stress Concern Present (10/02/2023)   [employer] of Occupational Health - Occupational Stress Questionnaire    Feeling of Stress : Not at all  Social Connections: Socially Integrated (05/02/2024)   Social Connection and Isolation Panel    Frequency of Communication with Friends and Family: More than three times a week    Frequency of Social Gatherings with Friends and Family: More than three times a week    Attends Religious Services: More than 4 times per year    Active Member of [employer] or Organizations: Yes    Attends Banker Meetings: More than 4 times per year    Marital Status: Married   Catering manager Violence: Not At Risk (05/02/2024)   Humiliation, Afraid, Rape, and Kick questionnaire    Fear of Current or Ex-Partner: No    Emotionally Abused: No    Physically Abused: No    Sexually Abused: No    Past Surgical History:  Procedure Laterality Date   ABDOMINAL HYSTERECTOMY     ANTERIOR CERVICAL DECOMP/DISCECTOMY FUSION N/A 01/03/2022   Procedure: Anterior Cervical Decompression Fusion - Cervical four-Cervical five - Cervical five-Cervical six;  Surgeon: Joshua Alm RAMAN, MD;  Location: [facility] OR;  Service: Neurosurgery;  Laterality: N/A;   BIOPSY  12/16/2020   Procedure: BIOPSY;  Surgeon: Aneita Gwendlyn DASEN, MD;  Location: [facility] ENDOSCOPY;  Service: Endoscopy;;   CARDIAC CATHETERIZATION N/A 04/18/2015   Procedure: Left Heart Cath and Coronary Angiography;  Surgeon: Rober Chroman, MD;  Location: [facility] INVASIVE CV LAB;  Service: Cardiovascular;  Laterality: N/A;   COLONOSCOPY     COLONOSCOPY W/ BIOPSIES AND POLYPECTOMY  2018   ESOPHAGOGASTRODUODENOSCOPY (EGD) WITH PROPOFOL  N/A 12/16/2020   Procedure: ESOPHAGOGASTRODUODENOSCOPY (EGD) WITH PROPOFOL ;  Surgeon: Aneita Gwendlyn DASEN, MD;  Location: [facility] ENDOSCOPY;  Service: Endoscopy;  Laterality: N/A;   FOOT SURGERY Bilateral    [facility] bunion,bone spur, tendon (1) -6'16, (1)-10'16   HEMATOMA EVACUATION N/A 01/05/2022   Procedure: Cervical Wound Exploration;  Surgeon: Gillie Duncans, MD;  Location: [facility] OR;  Service: Neurosurgery;  Laterality: N/A;   IR EPIDUROGRAPHY  07/21/2018   LUMBAR LAMINECTOMY/DECOMPRESSION MICRODISCECTOMY Bilateral 12/28/2015   Procedure: MICRO LUMBAR DECOMPRESSION L4 - L5 BILATERALLY;  Surgeon: Reyes Billing, MD;  Location: [facility] ORS;  Service: Orthopedics;  Laterality: Bilateral;   LUMBAR LAMINECTOMY/DECOMPRESSION MICRODISCECTOMY Bilateral 03/04/2018   Procedure: Revision of Microlumbar Decompression Bilateral Lumbar Four-Five;  Surgeon: Billing Reyes, MD;  Location: [facility] OR;  Service: Orthopedics;  Laterality:  Bilateral;  90 mins   LUMBAR WOUND DEBRIDEMENT N/A 12/25/2023   Procedure: Irrigation and debridement of lumbar wound;  Surgeon: Joshua Alm Hamilton, MD;  Location: [facility] OR;  Service: Neurosurgery;  Laterality: N/A;   POSTERIOR FUSION PEDICLE SCREW PLACEMENT N/A 12/07/2023   Procedure: LUMBAR FOUR-FIVE POSTERIOR LATERAL FUSION;  Surgeon: Joshua Alm Hamilton, MD;  Location: [facility] OR;  Service: Neurosurgery;  Laterality: N/A;   SAVORY DILATION N/A 12/16/2020   Procedure: SAVORY DILATION;  Surgeon: Aneita Gwendlyn DASEN, MD;  Location: [facility] ENDOSCOPY;  Service: Endoscopy;  Laterality: N/A;   SPINAL CORD STIMULATOR INSERTION N/A 09/28/2019   Procedure: THORACIC SPINAL CORD STIMULATOR INSERTION;  Surgeon: Burnetta Aures, MD;  Location: [facility] OR;  Service: Orthopedics;  Laterality: N/A;  2.5 hrs   SPINAL CORD STIMULATOR REMOVAL N/A 05/27/2021   Procedure: LUMBAR SPINAL CORD STIMULATOR REMOVAL;  Surgeon: Bluford Standing, MD;  Location: [facility] ORS;  Service: Neurosurgery;  Laterality: N/A;   TUBAL LIGATION     WISDOM TOOTH EXTRACTION     WOUND EXPLORATION N/A 03/04/2018   Procedure: EXPLORATION OF LUMBAR DECOMPRESSION WOUND;  Surgeon: Duwayne Purchase, MD;  Location: [facility] OR;  Service: Orthopedics;  Laterality: N/A;    Family History  Problem Relation Age of Onset   Heart attack Mother    Lung cancer Father    Cancer Father    Pancreatic cancer Sister    Breast cancer Sister 34   Multiple myeloma Sister    Breast cancer Sister        diagnosed in her 32's   Heart attack Sister    Throat cancer Brother    Lung cancer Brother    Stomach cancer Cousin    Colon cancer Neg Hx    Colon polyps Neg Hx    Esophageal cancer Neg Hx    Rectal cancer Neg Hx     Allergies  Allergen Reactions   Cephalosporins Anaphylaxis   Penicillins Anaphylaxis and Hives   Anesthetics, Amide Nausea And Vomiting    Does not know name of it. States [facility].    Betadine [Povidone Iodine] Other (See Comments)     Skin Burn caused scar on Left buttock   Latex Hives, Itching, Rash and Other (See Comments)   Peach [Prunus Persica] Hives   Xyzal  [Levocetirizine] Itching    Patient took Xyzal  for the first time to see if med would help her seasonal rhinitis d/t increased pollen - patient stated that she experienced intense itching over her entire body   Claritin [Loratadine]     pruritis    Hydrocodone      Altered mental state, feel spaced out     Current Outpatient Medications on File Prior to Visit  Medication Sig Dispense Refill   acetaminophen  (TYLENOL ) 500 MG tablet Take 500 mg by mouth every 6 (six) hours as needed for headache.     ALPRAZolam  (XANAX ) 0.5 MG tablet Take 1/2 - 1 tablet by mouth at bedtime as needed for anxiety. 30 tablet 3   amitriptyline  (ELAVIL ) 25 MG tablet Take 1 tablet (25 mg total) by mouth daily. 90 tablet 2   amLODipine  (NORVASC ) 5 MG tablet Take 1 tablet (5 mg total) by mouth daily. 90 tablet 2   atorvastatin  (LIPITOR) 20 MG tablet Take 1 tablet (20 mg total) by mouth daily. 90 tablet 2   baclofen  (LIORESAL ) 10 MG tablet Take 1 tablet (10 mg total) by mouth 3 (three) times daily as needed. 60 each 3   betamethasone  dipropionate 0.05 % cream Apply as directed to skin once a day to itchy areas only, avoiding normal skin, as needed. 45 g 0   carvedilol  (COREG ) 25 MG tablet Take 1 tablet (25 mg total) by mouth 2 (two) times daily with a meal. 180 tablet 3   cholecalciferol  (VITAMIN D3) 25 MCG (1000 UNIT) tablet Take 1,000 Units by mouth daily.     cromolyn  (OPTICROM ) 4 % ophthalmic solution Place 1-2 drops into both eyes 4 (four) times daily as needed. 10 mL 1   diphenhydrAMINE  (BENADRYL ) 25 MG tablet Take 25 mg by mouth daily as needed for itching.     docusate sodium  (COLACE) 100 MG capsule Take 100 mg by mouth 2 (two) times daily.     DULoxetine  (CYMBALTA ) 30 MG capsule Take 2 capsules (60 mg total) by mouth daily. 180 capsule 3   Estradiol  Acetate (FEMRING ) 0.05 MG/24HR  RING USE ONE RING VAGINALLY EVERY 3 MONTHS AS DIRECTED 1 each 4   fluconazole  (DIFLUCAN ) 200 MG tablet Take 1 tablet by mouth once a week 2 tablet 0   gabapentin  (NEURONTIN ) 600 MG tablet Take 1 tablet (600 mg total) by mouth 3 (three) times daily. 90 tablet 3   hydrALAZINE  (APRESOLINE ) 50 MG tablet Take 1 tablet (50 mg total) by mouth 3 (three) times daily. (Patient taking differently: Take 50 mg by mouth 2 (two) times daily.) 270 tablet 3   methocarbamol  (ROBAXIN ) 500 MG tablet Take 1 tablet (500 mg total) by mouth 3 (three) times daily as needed for muscle spasm. 30 tablet 6   mometasone  (NASONEX ) 50 MCG/ACT nasal spray Place 2 sprays into the nose daily. 17 g 3   ondansetron  (ZOFRAN ) 4 MG tablet Take 1 tablet (4 mg tablet) 30 - 60 minutes each prep dose 2 tablet 0   pregabalin  (LYRICA ) 300 MG capsule Take 1 capsule (300 mg total) by mouth 2 (two) times daily. 60 capsule 5   spironolactone  (ALDACTONE ) 50 MG tablet Take 1 tablet (50 mg total) by mouth daily. 90 tablet 3   tapentadol  (NUCYNTA ) 50 MG tablet Take 1 tablet (50 mg total) by mouth every 6 (six) to 8 (eight) hours as needed. 60 tablet 0   terbinafine (LAMISIL) 1 % cream Apply 1 Application topically 2 (two) times daily as needed (rash).     [DISCONTINUED] levocetirizine (XYZAL  ALLERGY 24HR) 5 MG tablet Take 1 tablet (5 mg total) by mouth every evening. 90 tablet 0   [DISCONTINUED] omeprazole  (PRILOSEC) 40 MG capsule Take 1 capsule (40 mg total) by mouth daily. 90 capsule 1   No current facility-administered medications on file prior to visit.    BP (!) 140/90   Pulse (!) 59   Temp 98.1 F (36.7 C) (Oral)   Ht 5' 5 (1.651 m)   SpO2 95%   BMI 26.96 kg/m       Objective:   Physical Exam Vitals and nursing note reviewed.  Constitutional:      Appearance: Normal appearance.  HENT:     Ears:     Comments: Dry cough   Cardiovascular:     Rate and Rhythm: Normal rate and regular rhythm.     Pulses: Normal pulses.      Heart sounds: Normal heart sounds.  Pulmonary:     Effort: Pulmonary effort is normal.     Breath sounds: Normal breath sounds. No wheezing or rhonchi.  Musculoskeletal:        General: Normal range of motion.  Skin:    General: Skin is warm and dry.  Neurological:     General: No focal deficit present.     Mental Status: She is alert and oriented to person, place, and time.  Psychiatric:        Mood and Affect: Mood normal.        Behavior: Behavior normal.        Thought Content: Thought content normal.        Judgment: Judgment normal.       Assessment & Plan:  1. Respiratory infection (Primary) - Likely viral but will get chest xray to r/o PNA.  - She has used Hycodan without issues in the past.  - DG Chest 2 View; Future - predniSONE  (DELTASONE ) 20 MG tablet; Take 1 tablet (20 mg total) by mouth daily with breakfast.  Dispense: 7 tablet; Refill: 0 - HYDROcodone  bit-homatropine (HYCODAN) 5-1.5 MG/5ML syrup; Take  5 mLs by mouth every 8 (eight) hours as needed for cough.  Dispense: 120 mL; Refill: 0  2. Essential hypertension - elevated in the office today. Likely from OTC medications.  - Monitor and follow up with PCP if still elevated.     Henley Boettner, NP

## 2024-06-07 ENCOUNTER — Ambulatory Visit: Payer: Self-pay | Admitting: Adult Health

## 2024-06-07 ENCOUNTER — Ambulatory Visit: Payer: Self-pay | Admitting: Physical Therapy

## 2024-06-07 DIAGNOSIS — M6281 Muscle weakness (generalized): Secondary | ICD-10-CM

## 2024-06-07 DIAGNOSIS — M5459 Other low back pain: Secondary | ICD-10-CM

## 2024-06-07 NOTE — Therapy (Signed)
 OUTPATIENT PHYSICAL THERAPY NEURO TREATMENT NOTE     Patient Name: [name] MRN: [id number] DOB:13-Dec-1960, 63 y.o., female Today's Date: 06/08/2024   PCP: [place], [name]., MD REFERRING PROVIDER: Babs Arthea DASEN, MD  END OF SESSION:  PT End of Session - 06/08/24 1517     Visit Number 19    Number of Visits 28    Date for PT Re-Evaluation 06/10/24    Authorization Type [employer]    Authorization Time Period 02-16-24 -04-25-24:  04-19-24 - 06-26-24    Progress Note Due on Visit 20    PT Start Time 1020    PT Stop Time 1104    PT Time Calculation (min) 44 min    Activity Tolerance Patient tolerated treatment well    Behavior During Therapy [facility] for tasks assessed/performed                            Past Medical History:  Diagnosis Date   Anxiety    Aortic atherosclerosis (HCC) 04/16/2021   Atypical angina (HCC)    Back pain    related to spinal stenosis and disc problem, radiates down left buttocks to leg., weakness occ.   Chest pain    a. 03/2015 Cath: nl cors; b. 03/2021 Cor CTA: Ca2+ = 0. Nl Cors.   Chronic pain syndrome    Dyspnea    hx   GERD (gastroesophageal reflux disease)    Grade I diastolic dysfunction    Headache    Hyperlipidemia    Hypertension    Lumbar post-laminectomy syndrome    LVH (left ventricular hypertrophy) 12/15/2020   a. 11/2020 Echo: EF 65-70%, no rwma, sev asymm LVH with IVSd 1.9 cm. No LVOT obs @ rest. Gr1 DD. Triv MR.   PONV (postoperative nausea and vomiting)    Pulmonary nodules    a. 03/2021 CT Chest: 3mm pulm nodules in bilat lower lobes. F/u 1 yr.   Right foot drop    Syncope    a. 11/2020 Zio: No significant arrhythmias.   Vaginal foreign object    Uses Femring    Past Surgical History:  Procedure Laterality Date   ABDOMINAL HYSTERECTOMY     ANTERIOR CERVICAL DECOMP/DISCECTOMY FUSION N/A 01/03/2022   Procedure: Anterior Cervical Decompression Fusion - Cervical four-Cervical five -  Cervical five-Cervical six;  Surgeon: Joshua Alm RAMAN, MD;  Location: [facility] OR;  Service: Neurosurgery;  Laterality: N/A;   BIOPSY  12/16/2020   Procedure: BIOPSY;  Surgeon: Aneita Gwendlyn DASEN, MD;  Location: [facility] ENDOSCOPY;  Service: Endoscopy;;   CARDIAC CATHETERIZATION N/A 04/18/2015   Procedure: Left Heart Cath and Coronary Angiography;  Surgeon: Rober Chroman, MD;  Location: [facility] INVASIVE CV LAB;  Service: Cardiovascular;  Laterality: N/A;   COLONOSCOPY     COLONOSCOPY W/ BIOPSIES AND POLYPECTOMY  2018   ESOPHAGOGASTRODUODENOSCOPY (EGD) WITH PROPOFOL  N/A 12/16/2020   Procedure: ESOPHAGOGASTRODUODENOSCOPY (EGD) WITH PROPOFOL ;  Surgeon: Aneita Gwendlyn DASEN, MD;  Location: [facility] ENDOSCOPY;  Service: Endoscopy;  Laterality: N/A;   FOOT SURGERY Bilateral    [facility] bunion,bone spur, tendon (1) -6'16, (1)-10'16   HEMATOMA EVACUATION N/A 01/05/2022   Procedure: Cervical Wound Exploration;  Surgeon: Gillie Duncans, MD;  Location: [facility] OR;  Service: Neurosurgery;  Laterality: N/A;   IR EPIDUROGRAPHY  07/21/2018   LUMBAR LAMINECTOMY/DECOMPRESSION MICRODISCECTOMY Bilateral 12/28/2015   Procedure: MICRO LUMBAR DECOMPRESSION L4 - L5 BILATERALLY;  Surgeon: Reyes Billing, MD;  Location: [facility] ORS;  Service: Orthopedics;  Laterality: Bilateral;   LUMBAR LAMINECTOMY/DECOMPRESSION MICRODISCECTOMY Bilateral 03/04/2018   Procedure: Revision of Microlumbar Decompression Bilateral Lumbar Four-Five;  Surgeon: Duwayne Purchase, MD;  Location: [facility] OR;  Service: Orthopedics;  Laterality: Bilateral;  90 mins   LUMBAR WOUND DEBRIDEMENT N/A 12/25/2023   Procedure: Irrigation and debridement of lumbar wound;  Surgeon: Joshua Alm Hamilton, MD;  Location: [facility] OR;  Service: Neurosurgery;  Laterality: N/A;   POSTERIOR FUSION PEDICLE SCREW PLACEMENT N/A 12/07/2023   Procedure: LUMBAR FOUR-FIVE POSTERIOR LATERAL FUSION;  Surgeon: Joshua Alm Hamilton, MD;  Location: [facility] OR;  Service: Neurosurgery;  Laterality: N/A;   SAVORY DILATION N/A  12/16/2020   Procedure: SAVORY DILATION;  Surgeon: Aneita Gwendlyn DASEN, MD;  Location: [facility] ENDOSCOPY;  Service: Endoscopy;  Laterality: N/A;   SPINAL CORD STIMULATOR INSERTION N/A 09/28/2019   Procedure: THORACIC SPINAL CORD STIMULATOR INSERTION;  Surgeon: Burnetta Aures, MD;  Location: [facility] OR;  Service: Orthopedics;  Laterality: N/A;  2.5 hrs   SPINAL CORD STIMULATOR REMOVAL N/A 05/27/2021   Procedure: LUMBAR SPINAL CORD STIMULATOR REMOVAL;  Surgeon: Bluford Standing, MD;  Location: [facility] ORS;  Service: Neurosurgery;  Laterality: N/A;   TUBAL LIGATION     WISDOM TOOTH EXTRACTION     WOUND EXPLORATION N/A 03/04/2018   Procedure: EXPLORATION OF LUMBAR DECOMPRESSION WOUND;  Surgeon: Duwayne Purchase, MD;  Location: [facility] OR;  Service: Orthopedics;  Laterality: N/A;   Patient Active Problem List   Diagnosis Date Noted   Vitamin D  deficiency, unspecified 04/13/2024   Seasonal allergic rhinitis due to pollen 01/29/2024   Delayed wound healing 12/25/2023   S/P lumbar fusion 12/07/2023   Pain in thumb joint with movement of right hand 08/12/2023   Localized primary osteoarthritis of carpometacarpal joint of right thumb 07/29/2023   Right foot drop 07/29/2023   Frequent falls 04/14/2023   Gastroesophageal reflux disease 04/14/2023   Right upper extremity numbness 09/29/2022   Stage 3a chronic kidney disease (HCC) 09/29/2022   Cervical spondylosis with radiculopathy 01/05/2022   S/P cervical spinal fusion 01/03/2022   Cervical disc disorder with radiculopathy of cervical region 11/27/2021   Uncontrolled pain 05/27/2021   Syncope 05/13/2021   Depression with anxiety 05/13/2021   Chronic diastolic CHF (congestive heart failure) (HCC) 05/13/2021   Chronic back pain 05/13/2021   Chest tightness 05/13/2021   Lower abdominal pain 05/13/2021   Syncope and collapse 01/01/2021   Abnormal echocardiogram 01/01/2021   BPPV (benign paroxysmal positional vertigo), left 12/26/2020   Odynophagia    Dysphagia  12/15/2020   Postural dizziness with presyncope 12/14/2020   Lumbar post-laminectomy syndrome    Chronic pain 09/28/2019   Lumbar spinal stenosis    Radicular pain    Gait disorder    Difficulty in urination 07/13/2018   Back pain 07/12/2018   Hyperlipidemia 04/16/2018   GAD (generalized anxiety disorder) 04/16/2018   AKI (acute kidney injury) (HCC)    Benign essential HTN    Major depression, recurrent (HCC)    Lumbar radiculopathy 03/09/2018   Paraparesis (HCC) 03/09/2018   Essential hypertension    Chronic nonintractable headache    Leukocytosis    Acute blood loss anemia    Postoperative pain    Generalized weakness    Spinal stenosis at L4-L5 level 03/04/2018   Spinal stenosis of lumbar region 12/28/2015   Chest pain 04/16/2015    ONSET DATE: Most recent Surgery date 12-07-23:  Referral date 02-03-24  REFERRING DIAG:  M54.16 (ICD-10-CM) - Lumbar radiculopathy M96.1 (ICD-10-CM) - Lumbar post-laminectomy syndrome R26.9 (ICD-10-CM) -  Gait disorder  THERAPY DIAG:  Muscle weakness (generalized)  Other low back pain  Rationale for Evaluation and Treatment: Rehabilitation  SUBJECTIVE:                                                                                                                                                                                             SUBJECTIVE STATEMENT: Pt reports back is sore from coughing so much - had bronchitis last week, reason for cancelling PT appt last Thursday; says she hasn't felt like doing any exercises/stretches due to illness - says her back is really tight and sore today. Pt accompanied by: self  PERTINENT HISTORY: The patient was admitted on 12/07/2023 &  underwent PLIF L4-5. Hospitalized 12-07-23 - 12-11-23:  The patient was admitted on 12/25/2023 and taken to the operating room where the patient underwent irrigation and debridement of lumbar wound with primary closure and placement of external wound VAC- hospitalized 12-25-23 -   12-26-23  Frequent falls:  Spinal cord stimulator insertion on 09-28-19; stimulator removal on 05-27-21 with resultant LLE weakness;  decompression and laminectomy on 12/28/15 at L4-L5  with redo decompression surgery on May 9,2019, post-op paraparesis with surgery to remove small hematoma; 07/12/18 fall at work (involved rolling chair) with hospital admission, transfer to CIR with d/c home 08/03/18; HTN, Bil foot surgery; left ventricular hypertrophy, chronic pain syndrome, dyspnea, hypertrophic cardiomyopathy; cervical fusion March 2023;  had carpal tunnel surgery RUE in April 2024:  LVH  PAIN:  Are you having pain? Pt rates pain 6/10 in lumbar region - greater pain in lower lumbar region near sacral area- same burning sensation Alleviating Factor:  placing pillow behind back when sitting Aggravating Factors: certain movements    PRECAUTIONS: Fall; No bending/lifting or twisting; pt states she has a reacher  WEIGHT BEARING RESTRICTIONS: No  FALLS: Has patient fallen in last 6 months? Yes. Number of falls 3  LIVING ENVIRONMENT: Lives with: lives with their spouse Lives in: House/apartment Stairs: Yes: External: 5 steps; on right going up Has following equipment at home: Single point cane, Wheelchair (manual), and [place] crutch  PLOF: Independent with basic ADLs, Independent with household mobility with device, Independent with community mobility with device, and Independent with transfers  PATIENT GOALS: improve walking and strength Rt leg  OBJECTIVE:   DIAGNOSTIC FINDINGS: IMPRESSION: 1. Interval anterior discectomy and fusion from C4 through C6 with improved patency of the spinal canal and only mild residual foraminal narrowing. 2. Stable mild spinal stenosis and mild to moderate left foraminal narrowing at C3-4. 3. Stable mild spinal stenosis and mild foraminal narrowing bilaterally at C6-7. Mild foraminal narrowing  bilaterally at C7-T1. 4. No cord deformity or abnormal cord  signal.  No acute findings. No recent MRI of lumbar region  COGNITION: Overall cognitive status: Within functional limits for tasks assessed   SENSATION: WFL  COORDINATION: Decreased RLE due to weakness and spasticity  POSTURE: No Significant postural limitations  LOWER EXTREMITY ROM:   LLE WFL's;  RLE WFL's except for ankle ROM which is minimal due to weakness; Pt has difficulty performing Rt knee extension due to Rt quad weakness and spasticity - turned body slightly to Lt to achieve more knee extension   LOWER EXTREMITY MMT:    MMT Right Eval Left Eval  Hip flexion 3- 3+   Hip extension    Hip abduction    Hip adduction    Hip internal rotation    Hip external rotation    Knee flexion 3+   Knee extension 4- 4  Ankle dorsiflexion 2-   Ankle plantarflexion    Ankle inversion    Ankle eversion    (Blank rows = not tested)  BED MOBILITY:  Modified independent  TRANSFERS: Assistive device utilized: Rollator  Sit to stand: Modified independence Stand to sit: Modified independence   STAIRS:  Not tested due to c/o back pain   GAIT: Gait pattern: decreased step length- Right, decreased stance time- Right, decreased hip/knee flexion- Right, and decreased ankle dorsiflexion- Right Distance walked: 50' Assistive device utilized: with rollator Level of assistance: SBA and CGA Comments: slow gait speed due to c/o back pain and RLE weakness  FUNCTIONAL TESTS:  Sit to stand score - 2 reps - with bil. UE support from chair- 29.91 secs             TUG score: - 49.19 secs with rollator  Gait velocity:  37.38 secs = .88 ft/sec with rollator  PATIENT SURVEYS:  N/A - based on diagnosis of RLE weakness due to chronic lumbar radiculopathy   TODAY'S TREATMENT: 06-07-24                                                                                                                           Gait: Pt using rollator and wearing Blue rocker AFO on RLE - pt modified independent  with use of rollator  Pt amb. Clinic distances with rollator with supervision  TherEx:  Pt performed following exercises in supine position to reduce back pain and to increase strength in low back musc. and ROM of lumbar spine:   Pelvic tilt 10 reps 5 sec hold Pelvic tilt with heel slide - 10 reps RLE:  LLE 10 reps Bridging small range 10 reps   LE's placed on red physioball - pt performed hip and knee flexion toward chest for low back stretching - small range due to c/o tightness in low back - 10 reps Trunk rotation to Lt side 10 times - hold 3 secs Pelvic tilt with hip flexion in hooklying - RLE 5 reps;  LLE 10 reps Seated position  on side of mat - performed low back stretching by rolling red physioball forward with 5 sec hold x 10 reps  Seated pelvic rocking for increased pelvic mobility and low back stretching - 10 reps  Scifit level 2.5 - bil. UE and LE's x 10;  ice pack applied to low back for pain management =========================    Medbridge:   Access Code: [email or web address] URL: [email or web address] Date: 05/23/2024 Prepared by: Rock Kussmaul  Exercises - Supine Posterior Pelvic Tilt  - 1 x daily - 7 x weekly - 1 sets - 10 reps - 5 sec hold - Supine Heel Slides  - 1 x daily - 7 x weekly - 1 sets - 10 reps - Lifting leg up/down in this position  - 1 x daily - 7 x weekly - 1 sets - 10 reps - Supine Lower Trunk Rotation  - 1 x daily - 7 x weekly - 1 sets - 5 reps - Supine Bridge with Pelvic Floor Contraction  - 1 x daily - 7 x weekly - 1 sets - 5 reps - 3 sec hold   UPDATED HEP: Access Code: [email or web address] URL: [email or web address] Date: 04/27/2024 Prepared by: Rock Kussmaul  Exercises - Seated Knee Extension with Resistance  - 1 x daily - 7 x weekly - 3 sets - 10 reps - TAP UPS - with Left leg   - 1 x daily - 7 x weekly - 3 sets - 10 reps - Standing March with Counter Support  - 1 x daily - 7 x weekly - 3 sets - 10 reps - Forward Step Up with  Counter Support  - 1 x daily - 7 x weekly - 3 sets - 10 reps - Alternating Step Forward with Support  - 1 x daily - 7 x weekly - 3 sets - 10 reps   PATIENT EDUCATION: Education details:  Medbridge  Person educated: Patient Education method: Explanation - handout declined as pt stated she has it from previous OP admission Education comprehension: verbalized understanding  HOME EXERCISE PROGRAM:   GOALS: Goals reviewed with patient? Yes  LONG TERM GOALS: Target date: 04-15-24  Amb. 230' with RW with SBA without requiring seated rest period. Baseline: 134' x 1, 87' x 1 rep Goal status: Not met 04-19-24  2.  Improve TUG score to </= 28 secs  with use of RW for improved mobility and reduced fall risk. Baseline: 49.19 secs with rollator;  28.5 secs with rollator Goal status: Partially met 04-19-24  3.  Perform 5x sit to stand transfers with LUE support only to demo improved strength in RLE.  Baseline: 36.16 secs - 4x with bil. UE support from chair - had to stop due to burning sensation in low back Goal status: Not met 04-19-24  4.  Modified independent step negotiation with use of bil. Hand rails using step by step sequence. Baseline:  Goal status: Goal met 04-19-24  5.  Increase gait velocity to >/= 1.6 ft/sec with rollator for increased gait efficiency with reduced fall risk.          Baseline: 37.38 secs = .88 ft/sec with rollator;  33.44 secs = .98 ft/sec with rollator          Goal status:  Not met 04-19-24   6.  Independent in updated HEP for RLE strengthening. Baseline:  Goal status:  Goal met 04-19-24   7.  Report reduced low back pain at rest to </= 3/10 intensity.  Baseline:  7/10;    04-20-24 -- 5/10            Goal status:  Not met - 04-19-24   UPDATED STG'S:  TARGET DATE 05-13-24  1.  Amb. 230' with RW with SBA without requiring seated rest period.             Baseline: 134' x 1, 87' x 1 rep on 04-19-24     Goal status:  Ongoing; NT in today's session due to  c/o Rt hand pain due to recurrence of CTS   2.  Improve TUG score to </= 26 secs  with use of RW for improved mobility and reduced fall risk. Baseline: 49.19 secs with rollator;  28.5 secs with rollator Goal status:  In progress  3.  Perform 5x sit to stand from chair with bil. UE support in </= 30 secs to demonstrate improved mobility.     Baseline:  36.16 secs - 4 reps with bil. UE support from chair    Goal status:  In progress - not tested in today's session due to c/o Rt hand pain due to recurrence of CTS     4.  Pt will report reduced pain in low back to </= 4/10 intensity for increased ease with ADL's and mobility.    Baseline:  5/10 on 04-19-24    Goal status:  Goal met 05-12-24  UPDATED LONG TERM GOALS: Target date: 06-10-24   Amb. 350' with RW with SBA without requiring seated rest period. Baseline: 134' x 1, 87' x 1 rep - 04-19-24 Goal status: NEW  2.  Improve TUG score to </= 22 secs  with use of RW for improved mobility and reduced fall risk. Baseline: 49.19 secs with rollator;  28.5 secs with rollator - 04-19-24 Goal status: NEW  3.  Perform 5x sit to stand transfers with LUE support only to demo improved strength in RLE IN </= 25 secs to demo improved functional mobility. Baseline: 36.16 secs - 4x with bil. UE support from chair - had to stop due to burning sensation in low back Goal status: NEW  4.  Increase gait velocity to >/= 1.6 ft/sec with rollator for increased gait efficiency with reduced fall risk.          Baseline: 37.38 secs = .88 ft/sec with rollator;  33.44 secs = .98 ft/sec with rollator          Goal status:  ONGOING   6.  Independent in updated HEP for RLE strengthening. Baseline:  Goal status:  ONGOING   7.  Report reduced low back pain at rest to </= 3/10 intensity.            Baseline:  7/10;    04-20-24 -- 5/10            Goal status:  ONGOING   ASSESSMENT:  CLINICAL IMPRESSION: PT session focused on gentle low back strengthening and  stretching exercises.  Pt reported back pain 6/10 at start of session and 5/10 at end of session.  Pt reported increased tightness in low back musc. Due to not having PT since last Tuesday due to having bronchitis and not doing HEP at home due to not feeling well.  Pt is making slow but steady progress with decreasing low back pain/tightness.  Cont with POC.   OBJECTIVE IMPAIRMENTS: Abnormal gait, decreased balance, decreased coordination, decreased endurance, decreased strength, and impaired tone.  ACTIVITY LIMITATIONS: carrying, bending, squatting, stairs, transfers, and locomotion level  PARTICIPATION LIMITATIONS: cleaning, laundry, shopping, and community activity  PERSONAL FACTORS: Behavior pattern, Past/current experiences, and Time since onset of injury/illness/exacerbation are also affecting patient's functional outcome.   REHAB POTENTIAL: Good  CLINICAL DECISION MAKING: Evolving/moderate complexity  EVALUATION COMPLEXITY: Moderate  PLAN:  PT FREQUENCY: 2x/week  PT DURATION: 8 weeks  PLANNED INTERVENTIONS: Therapeutic exercises, Therapeutic activity, Neuromuscular re-education, Balance training, Gait training, Patient/Family education, Self Care, Stair training, Orthotic/Fit training, DME instructions, and Aquatic Therapy  PLAN FOR NEXT SESSION:  10th visit progress note due - check LTG's.   Continue with low back stretching; ball exercises    Kaile Bixler, [place], PT 06/08/2024, 3:18 PM

## 2024-06-07 NOTE — Telephone Encounter (Signed)
**Note De-identified  [place] Obfuscation** Please advise 

## 2024-06-08 ENCOUNTER — Encounter: Payer: Self-pay | Admitting: Physical Therapy

## 2024-06-09 ENCOUNTER — Ambulatory Visit: Payer: Self-pay | Admitting: Physical Therapy

## 2024-06-09 DIAGNOSIS — M6281 Muscle weakness (generalized): Secondary | ICD-10-CM | POA: Diagnosis not present

## 2024-06-09 DIAGNOSIS — M5459 Other low back pain: Secondary | ICD-10-CM

## 2024-06-09 NOTE — Therapy (Signed)
 OUTPATIENT PHYSICAL THERAPY NEURO TREATMENT NOTE/20TH VISIT PROGRESS NOTE   Progress Note  Reporting Period 04-07-24 to 06-09-24  See note below for Objective Data and Assessment of Progress/Goals.  Thank you for the referral of this patient.     Patient Name: [name] MRN: [id number] DOB:02-12-1961, 63 y.o., female Today's Date: 06/10/2024   PCP: [place], [name]., MD REFERRING PROVIDER: Babs Arthea DASEN, MD  END OF SESSION:  PT End of Session - 06/10/24 1054     Visit Number 20    Number of Visits 28    Date for PT Re-Evaluation 06/10/24    Authorization Type [employer]    Authorization Time Period 02-16-24 -04-25-24:  04-19-24 - 06-26-24    Progress Note Due on Visit 20    PT Start Time 1024    PT Stop Time 1105    PT Time Calculation (min) 41 min    Activity Tolerance Patient tolerated treatment well    Behavior During Therapy [facility] for tasks assessed/performed                             Past Medical History:  Diagnosis Date   Anxiety    Aortic atherosclerosis (HCC) 04/16/2021   Atypical angina (HCC)    Back pain    related to spinal stenosis and disc problem, radiates down left buttocks to leg., weakness occ.   Chest pain    a. 03/2015 Cath: nl cors; b. 03/2021 Cor CTA: Ca2+ = 0. Nl Cors.   Chronic pain syndrome    Dyspnea    hx   GERD (gastroesophageal reflux disease)    Grade I diastolic dysfunction    Headache    Hyperlipidemia    Hypertension    Lumbar post-laminectomy syndrome    LVH (left ventricular hypertrophy) 12/15/2020   a. 11/2020 Echo: EF 65-70%, no rwma, sev asymm LVH with IVSd 1.9 cm. No LVOT obs @ rest. Gr1 DD. Triv MR.   PONV (postoperative nausea and vomiting)    Pulmonary nodules    a. 03/2021 CT Chest: 3mm pulm nodules in bilat lower lobes. F/u 1 yr.   Right foot drop    Syncope    a. 11/2020 Zio: No significant arrhythmias.   Vaginal foreign object    Uses Femring    Past Surgical History:   Procedure Laterality Date   ABDOMINAL HYSTERECTOMY     ANTERIOR CERVICAL DECOMP/DISCECTOMY FUSION N/A 01/03/2022   Procedure: Anterior Cervical Decompression Fusion - Cervical four-Cervical five - Cervical five-Cervical six;  Surgeon: Joshua Alm RAMAN, MD;  Location: [facility] OR;  Service: Neurosurgery;  Laterality: N/A;   BIOPSY  12/16/2020   Procedure: BIOPSY;  Surgeon: Aneita Gwendlyn DASEN, MD;  Location: [facility] ENDOSCOPY;  Service: Endoscopy;;   CARDIAC CATHETERIZATION N/A 04/18/2015   Procedure: Left Heart Cath and Coronary Angiography;  Surgeon: Rober Chroman, MD;  Location: [facility] INVASIVE CV LAB;  Service: Cardiovascular;  Laterality: N/A;   COLONOSCOPY     COLONOSCOPY W/ BIOPSIES AND POLYPECTOMY  2018   ESOPHAGOGASTRODUODENOSCOPY (EGD) WITH PROPOFOL  N/A 12/16/2020   Procedure: ESOPHAGOGASTRODUODENOSCOPY (EGD) WITH PROPOFOL ;  Surgeon: Aneita Gwendlyn DASEN, MD;  Location: [facility] ENDOSCOPY;  Service: Endoscopy;  Laterality: N/A;   FOOT SURGERY Bilateral    [facility] bunion,bone spur, tendon (1) -6'16, (1)-10'16   HEMATOMA EVACUATION N/A 01/05/2022   Procedure: Cervical Wound Exploration;  Surgeon: Gillie Duncans, MD;  Location: [facility] OR;  Service: Neurosurgery;  Laterality: N/A;  IR EPIDUROGRAPHY  07/21/2018   LUMBAR LAMINECTOMY/DECOMPRESSION MICRODISCECTOMY Bilateral 12/28/2015   Procedure: MICRO LUMBAR DECOMPRESSION L4 - L5 BILATERALLY;  Surgeon: Reyes Billing, MD;  Location: [facility] ORS;  Service: Orthopedics;  Laterality: Bilateral;   LUMBAR LAMINECTOMY/DECOMPRESSION MICRODISCECTOMY Bilateral 03/04/2018   Procedure: Revision of Microlumbar Decompression Bilateral Lumbar Four-Five;  Surgeon: Billing Reyes, MD;  Location: [facility] OR;  Service: Orthopedics;  Laterality: Bilateral;  90 mins   LUMBAR WOUND DEBRIDEMENT N/A 12/25/2023   Procedure: Irrigation and debridement of lumbar wound;  Surgeon: Joshua Alm Hamilton, MD;  Location[facility] OR;  Service: Neurosurgery;  Laterality: N/A;   POSTERIOR FUSION PEDICLE SCREW  PLACEMENT N/A 12/07/2023   Procedure: LUMBAR FOUR-FIVE POSTERIOR LATERAL FUSION;  Surgeon: Joshua Alm Hamilton, MD;  Location: [facility] OR;  Service: Neurosurgery;  Laterality: N/A;   SAVORY DILATION N/A 12/16/2020   Procedure: SAVORY DILATION;  Surgeon: Aneita Gwendlyn DASEN, MD;  Location: [facility] ENDOSCOPY;  Service: Endoscopy;  Laterality: N/A;   SPINAL CORD STIMULATOR INSERTION N/A 09/28/2019   Procedure: THORACIC SPINAL CORD STIMULATOR INSERTION;  Surgeon: Burnetta Aures, MD;  Location: [facility] OR;  Service: Orthopedics;  Laterality: N/A;  2.5 hrs   SPINAL CORD STIMULATOR REMOVAL N/A 05/27/2021   Procedure: LUMBAR SPINAL CORD STIMULATOR REMOVAL;  Surgeon: Bluford Standing, MD;  Location: [facility] ORS;  Service: Neurosurgery;  Laterality: N/A;   TUBAL LIGATION     WISDOM TOOTH EXTRACTION     WOUND EXPLORATION N/A 03/04/2018   Procedure: EXPLORATION OF LUMBAR DECOMPRESSION WOUND;  Surgeon: Billing Reyes, MD;  Location: [facility] OR;  Service: Orthopedics;  Laterality: N/A;   Patient Active Problem List   Diagnosis Date Noted   Vitamin D  deficiency, unspecified 04/13/2024   Seasonal allergic rhinitis due to pollen 01/29/2024   Delayed wound healing 12/25/2023   S/P lumbar fusion 12/07/2023   Pain in thumb joint with movement of right hand 08/12/2023   Localized primary osteoarthritis of carpometacarpal joint of right thumb 07/29/2023   Right foot drop 07/29/2023   Frequent falls 04/14/2023   Gastroesophageal reflux disease 04/14/2023   Right upper extremity numbness 09/29/2022   Stage 3a chronic kidney disease (HCC) 09/29/2022   Cervical spondylosis with radiculopathy 01/05/2022   S/P cervical spinal fusion 01/03/2022   Cervical disc disorder with radiculopathy of cervical region 11/27/2021   Uncontrolled pain 05/27/2021   Syncope 05/13/2021   Depression with anxiety 05/13/2021   Chronic diastolic CHF (congestive heart failure) (HCC) 05/13/2021   Chronic back pain 05/13/2021   Chest tightness 05/13/2021   Lower  abdominal pain 05/13/2021   Syncope and collapse 01/01/2021   Abnormal echocardiogram 01/01/2021   BPPV (benign paroxysmal positional vertigo), left 12/26/2020   Odynophagia    Dysphagia 12/15/2020   Postural dizziness with presyncope 12/14/2020   Lumbar post-laminectomy syndrome    Chronic pain 09/28/2019   Lumbar spinal stenosis    Radicular pain    Gait disorder    Difficulty in urination 07/13/2018   Back pain 07/12/2018   Hyperlipidemia 04/16/2018   GAD (generalized anxiety disorder) 04/16/2018   AKI (acute kidney injury) (HCC)    Benign essential HTN    Major depression, recurrent (HCC)    Lumbar radiculopathy 03/09/2018   Paraparesis (HCC) 03/09/2018   Essential hypertension    Chronic nonintractable headache    Leukocytosis    Acute blood loss anemia    Postoperative pain    Generalized weakness    Spinal stenosis at L4-L5 level 03/04/2018   Spinal stenosis of lumbar region 12/28/2015  Chest pain 04/16/2015    ONSET DATE: Most recent Surgery date 12-07-23:  Referral date 02-03-24  REFERRING DIAG:  M54.16 (ICD-10-CM) - Lumbar radiculopathy M96.1 (ICD-10-CM) - Lumbar post-laminectomy syndrome R26.9 (ICD-10-CM) - Gait disorder  THERAPY DIAG:  Muscle weakness (generalized)  Other low back pain  Rationale for Evaluation and Treatment: Rehabilitation  SUBJECTIVE:                                                                                                                                                                                             SUBJECTIVE STATEMENT: Pt reports she is feeling better - almost fully recovered from bronchitis - back is feeling better, rates pain 5/10; still has some burning sensation which she says she is going to talk with Dr. Joshua about this continued discomfort Pt accompanied by: self  PERTINENT HISTORY: The patient was admitted on 12/07/2023 &  underwent PLIF L4-5. Hospitalized 12-07-23 - 12-11-23:  The patient was admitted on  12/25/2023 and taken to the operating room where the patient underwent irrigation and debridement of lumbar wound with primary closure and placement of external wound VAC- hospitalized 12-25-23 -  12-26-23  Frequent falls:  Spinal cord stimulator insertion on 09-28-19; stimulator removal on 05-27-21 with resultant LLE weakness;  decompression and laminectomy on 12/28/15 at L4-L5  with redo decompression surgery on May 9,2019, post-op paraparesis with surgery to remove small hematoma; 07/12/18 fall at work (involved rolling chair) with hospital admission, transfer to CIR with d/c home 08/03/18; HTN, Bil foot surgery; left ventricular hypertrophy, chronic pain syndrome, dyspnea, hypertrophic cardiomyopathy; cervical fusion March 2023;  had carpal tunnel surgery RUE in April 2024:  LVH  PAIN:  Are you having pain? Pt rates pain 5/10 in lumbar region - greater pain in lower lumbar region near sacral area- same burning sensation Alleviating Factor:  placing pillow behind back when sitting Aggravating Factors: certain movements    PRECAUTIONS: Fall; No bending/lifting or twisting; pt states she has a reacher  WEIGHT BEARING RESTRICTIONS: No  FALLS: Has patient fallen in last 6 months? Yes. Number of falls 3  LIVING ENVIRONMENT: Lives with: lives with their spouse Lives in: House/apartment Stairs: Yes: External: 5 steps; on right going up Has following equipment at home: Single point cane, Wheelchair (manual), and L[place]strand crutch  PLOF: Independent with basic ADLs, Independent with household mobility with device, Independent with community mobility with device, and Independent with transfers  PATIENT GOALS: improve walking and strength Rt leg  OBJECTIVE:   DIAGNOSTIC FINDINGS: IMPRESSION: 1. Interval anterior discectomy and fusion from C4 through C6 with improved patency of the spinal canal and only  mild residual foraminal narrowing. 2. Stable mild spinal stenosis and mild to moderate left  foraminal narrowing at C3-4. 3. Stable mild spinal stenosis and mild foraminal narrowing bilaterally at C6-7. Mild foraminal narrowing bilaterally at C7-T1. 4. No cord deformity or abnormal cord signal.  No acute findings. No recent MRI of lumbar region  COGNITION: Overall cognitive status: Within functional limits for tasks assessed   SENSATION: WFL  COORDINATION: Decreased RLE due to weakness and spasticity  POSTURE: No Significant postural limitations  LOWER EXTREMITY ROM:   LLE WFL's;  RLE WFL's except for ankle ROM which is minimal due to weakness; Pt has difficulty performing Rt knee extension due to Rt quad weakness and spasticity - turned body slightly to Lt to achieve more knee extension   LOWER EXTREMITY MMT:    MMT Right Eval Left Eval  Hip flexion 3- 3+   Hip extension    Hip abduction    Hip adduction    Hip internal rotation    Hip external rotation    Knee flexion 3+   Knee extension 4- 4  Ankle dorsiflexion 2-   Ankle plantarflexion    Ankle inversion    Ankle eversion    (Blank rows = not tested)  BED MOBILITY:  Modified independent  TRANSFERS: Assistive device utilized: Rollator  Sit to stand: Modified independence Stand to sit: Modified independence   STAIRS:  Not tested due to c/o back pain   GAIT: Gait pattern: decreased step length- Right, decreased stance time- Right, decreased hip/knee flexion- Right, and decreased ankle dorsiflexion- Right Distance walked: 50' Assistive device utilized: with rollator Level of assistance: SBA and CGA Comments: slow gait speed due to c/o back pain and RLE weakness  FUNCTIONAL TESTS:  Sit to stand score - 2 reps - with bil. UE support from chair- 29.91 secs             TUG score: - 49.19 secs with rollator  Gait velocity:  37.38 secs = .88 ft/sec with rollator  PATIENT SURVEYS:  N/A - based on diagnosis of RLE weakness due to chronic lumbar radiculopathy   TODAY'S TREATMENT: 06-09-24                                                                                                                            Gait: Pt using rollator and wearing Blue rocker AFO on RLE - pt modified independent with use of rollator  Pt amb. Clinic distances with rollator with supervision  TherEx: Seated position on side of mat - performed low back stretching by rolling red physioball forward with 5 sec hold x 10 reps  Pt performed following exercises in supine position to reduce back pain and to increase strength in low back musc. and ROM of lumbar spine:   Pelvic tilt 10 reps 5 sec hold Pelvic tilt with heel slide - 10 reps RLE:  LLE 10 reps Bridging small range 10 reps   LE's placed on  red physioball - pt performed hip and knee flexion toward chest for low back stretching - 10 reps Trunk rotation to Lt side 9 times - hold 3 secs; 10x  Pelvic tilt with hip flexion in hooklying - RLE 5 reps x 2 sets;  LLE 10 rep Seated pelvic rocking for increased pelvic mobility and low back stretching - 10 reps  Scifit level 2.5 - bil. UE and LE's x 10;  ice pack applied to low back for pain management =========================    Medbridge:   Access Code: [email or web address] URL: [email or web address] Date: 05/23/2024 Prepared by: Rock Kussmaul  Exercises - Supine Posterior Pelvic Tilt  - 1 x daily - 7 x weekly - 1 sets - 10 reps - 5 sec hold - Supine Heel Slides  - 1 x daily - 7 x weekly - 1 sets - 10 reps - Lifting leg up/down in this position  - 1 x daily - 7 x weekly - 1 sets - 10 reps - Supine Lower Trunk Rotation  - 1 x daily - 7 x weekly - 1 sets - 5 reps - Supine Bridge with Pelvic Floor Contraction  - 1 x daily - 7 x weekly - 1 sets - 5 reps - 3 sec hold   UPDATED HEP: Access Code: [email or web address] URL: [email or web address] Date: 04/27/2024 Prepared by: Rock Kussmaul  Exercises - Seated Knee Extension with Resistance  - 1 x daily - 7 x weekly - 3 sets - 10 reps - TAP UPS - with  Left leg   - 1 x daily - 7 x weekly - 3 sets - 10 reps - Standing March with Counter Support  - 1 x daily - 7 x weekly - 3 sets - 10 reps - Forward Step Up with Counter Support  - 1 x daily - 7 x weekly - 3 sets - 10 reps - Alternating Step Forward with Support  - 1 x daily - 7 x weekly - 3 sets - 10 reps   PATIENT EDUCATION: Education details:  Medbridge  Person educated: Patient Education method: Explanation - handout declined as pt stated she has it from previous OP admission Education comprehension: verbalized understanding  HOME EXERCISE PROGRAM:   GOALS: Goals reviewed with patient? Yes  LONG TERM GOALS: Target date: 04-15-24  Amb. 230' with RW with SBA without requiring seated rest period. Baseline: 134' x 1, 87' x 1 rep Goal status: Not met 04-19-24  2.  Improve TUG score to </= 28 secs  with use of RW for improved mobility and reduced fall risk. Baseline: 49.19 secs with rollator;  28.5 secs with rollator Goal status: Partially met 04-19-24  3.  Perform 5x sit to stand transfers with LUE support only to demo improved strength in RLE.  Baseline: 36.16 secs - 4x with bil. UE support from chair - had to stop due to burning sensation in low back Goal status: Not met 04-19-24  4.  Modified independent step negotiation with use of bil. Hand rails using step by step sequence. Baseline:  Goal status: Goal met 04-19-24  5.  Increase gait velocity to >/= 1.6 ft/sec with rollator for increased gait efficiency with reduced fall risk.          Baseline: 37.38 secs = .88 ft/sec with rollator;  33.44 secs = .98 ft/sec with rollator          Goal status:  Not met 04-19-24   6.  Independent in updated HEP for RLE strengthening. Baseline:  Goal  status:  Goal met 04-19-24   7.  Report reduced low back pain at rest to </= 3/10 intensity.            Baseline:  7/10;    04-20-24 -- 5/10            Goal status:  Not met - 04-19-24   UPDATED STG'S:  TARGET DATE 05-13-24  1.  Amb. 230' with  RW with SBA without requiring seated rest period.             Baseline: 134' x 1, 87' x 1 rep on 04-19-24     Goal status:  Ongoing; NT in today's session due to c/o Rt hand pain due to recurrence of CTS   2.  Improve TUG score to </= 26 secs  with use of RW for improved mobility and reduced fall risk. Baseline: 49.19 secs with rollator;  28.5 secs with rollator Goal status:  In progress  3.  Perform 5x sit to stand from chair with bil. UE support in </= 30 secs to demonstrate improved mobility.     Baseline:  36.16 secs - 4 reps with bil. UE support from chair    Goal status:  In progress - not tested in today's session due to c/o Rt hand pain due to recurrence of CTS     4.  Pt will report reduced pain in low back to </= 4/10 intensity for increased ease with ADL's and mobility.    Baseline:  5/10 on 04-19-24    Goal status:  Goal met 05-12-24  UPDATED LONG TERM GOALS: Target date: 06-10-24   Amb. 350' with RW with SBA without requiring seated rest period. Baseline: 134' x 1, 87' x 1 rep - 04-19-24 Goal status: Not met 06-09-24  2.  Improve TUG score to </= 22 secs  with use of RW for improved mobility and reduced fall risk. Baseline: 49.19 secs with rollator;  28.5 secs with rollator - 04-19-24 Goal status:  Ongoing  3.  Perform 5x sit to stand transfers with LUE support only to demo improved strength in RLE IN </= 25 secs to demo improved functional mobility. Baseline: 36.16 secs - 4x with bil. UE support from chair - had to stop due to burning sensation in low back Goal status:  Ongoing - requested not to attempt on 06-09-24 due to not wanting to risk increasing pain/discomfort in low back  4.  Increase gait velocity to >/= 1.6 ft/sec with rollator for increased gait efficiency with reduced fall risk.          Baseline: 37.38 secs = .88 ft/sec with rollator;  33.44 secs = .98 ft/sec with rollator          Goal status:  ONGOING   6.  Independent in updated HEP for RLE  strengthening. Baseline:  Goal status:  Goal met 06-09-24   7.  Report reduced low back pain at rest to </= 3/10 intensity.            Baseline:  7/10;    04-20-24 -- 5/10;    5/10 = 06-09-24            Goal status: Not met 06-09-24   ASSESSMENT:   CLINICAL IMPRESSION: This 10th visit progress note covers dates 04-07-24 - 06-09-24.  Pt has only met LTG #6 as pt is independent in HEP for low back stretching and strengthening exercises.  Pt continues to report pain with some burning sensation in low  back region with pain intensity rating 5/10 (lowest rating reported during this episode of care).  LTG #7 not met as goal set at pain intensity </= 3/10.  5x sit to stand not assessed per pt's request due to not wanting to aggravate low back - requested to assess at next session.  LTG's #1-4 are ongoing as not achieved at current time.  Pt is plateauing in maximizing functional status at this time.  2 additional visits scheduled in 2 weeks as pt is scheduled to undergo CTS on 06-15-24.  Cont with POC.   OBJECTIVE IMPAIRMENTS: Abnormal gait, decreased balance, decreased coordination, decreased endurance, decreased strength, and impaired tone.  ACTIVITY LIMITATIONS: carrying, bending, squatting, stairs, transfers, and locomotion level  PARTICIPATION LIMITATIONS: cleaning, laundry, shopping, and community activity  PERSONAL FACTORS: Behavior pattern, Past/current experiences, and Time since onset of injury/illness/exacerbation are also affecting patient's functional outcome.   REHAB POTENTIAL: Good  CLINICAL DECISION MAKING: Evolving/moderate complexity  EVALUATION COMPLEXITY: Moderate  PLAN:  PT FREQUENCY: 2x/week  PT DURATION: 8 weeks  PLANNED INTERVENTIONS: Therapeutic exercises, Therapeutic activity, Neuromuscular re-education, Balance training, Gait training, Patient/Family education, Self Care, Stair training, Orthotic/Fit training, DME instructions, and Aquatic Therapy  PLAN FOR NEXT  SESSION:  Continue with low back stretching; ball exercises    Terryl Niziolek, [place], PT 06/10/2024, 10:57 AM

## 2024-06-10 ENCOUNTER — Encounter: Payer: Self-pay | Admitting: Physical Therapy

## 2024-06-14 ENCOUNTER — Other Ambulatory Visit: Payer: Self-pay

## 2024-06-14 DIAGNOSIS — D485 Neoplasm of uncertain behavior of skin: Secondary | ICD-10-CM | POA: Diagnosis not present

## 2024-06-14 NOTE — Patient Instructions (Signed)
 Visit Information  Thank you for taking time to visit with me today. Please don't hesitate to contact me if I can be of assistance to you before our next scheduled telephone appointment.  Our next appointment is by telephone on 06/30/2024 at 10:00 am  Following is a copy of your care plan:   Goals Addressed             This Visit's Progress    VBCI RN Care Plan       Problems:  Chronic Disease Management support and education needs related to Back Pain  Goal: Over the next 4 months the Patient will demonstrate Improved adherence to prescribed treatment plan for back pain as evidenced by attending outpatient PT as ordered, rate back pain of 5 or less. 02/26/24 televisit w/ RNCM, pain remains higher than desired, at 7 out of pain scale 0-10. On 5/16 televisit w/ RNCM, pain level was much improved at and at desired level of <5, rating pain at 4 out of pain scale 0-10. On 7/7 televisit w/ RNCM, pain level rated at 5-6, has follow up with Dr. Babs planned for 7/9 to further address post surgical back pain. During 7/21 RNCM appt, patient stated post-op pain has improved to desired level of <5, patient also reported that her Orthopedic Surgeon stated that she had extensive & complex back surgery and it was not unusual for full healing to take 8-10 months. Patient continues her twice weekly PT sessions with Physical Therapist Elvie Ipoijb who patient feels has helped tremendously with her post op progress.8/19/ RNCM appt - patient is presently recovering from a severe URI for which she sought tx from her PCP (neg Covid, neg Flu) and is foregoing PT treatment while she rests and recovers.  Interventions:   Pain Interventions: Pain assessment performed Medications reviewed Reviewed provider established plan for pain management Discussed importance of adherence to all scheduled medical appointments Counseled on the importance of reporting any/all new or changed pain symptoms or management strategies  to pain management provider Discussed use of relaxation techniques and/or diversional activities to assist with pain reduction (distraction, imagery, relaxation, massage, acupressure, TENS, heat, and cold application Reviewed with patient prescribed pharmacological and nonpharmacological pain relief strategies Screening for signs and symptoms of depression related to chronic disease state  Assessed social determinant of health barriers  Patient Self-Care Activities:  Attend all scheduled provider appointments Call pharmacy for medication refills 3-7 days in advance of running out of medications Call provider office for new concerns or questions  Take medications as prescribed    Plan:  Telephone follow up appointment with care management team member scheduled for:  September 4 at 10:00am.          VBCI RN Care Plan       Problems:  Chronic Disease Management support and education needs related to Frequent Falls   Goal: Over the next 4 months the Patient will demonstrate ongoing self health care management ability to avoid injury as evidenced by    no falls.   Interventions:   Falls Interventions: Provided written and verbal education re: potential causes of falls and Fall prevention strategies Reviewed medications and discussed potential side effects of medications such as dizziness and frequent urination Assessed for signs and symptoms of orthostatic hypotension  Patient Self-Care Activities:  Take extra care when getting out of bed, sit on the bed for a few minutes to allow blood pressure to stabilize before ambulating. Perform same caution when going from sitting to  standing.  Use hand rails when possible, be aware of surroundings when walking.   Plan:  The patient has been provided with contact information for the care management team and has been advised to call with any health related questions or concerns.  Follow up appointment with RNCM changed to 06/30/24 @ 10:00  am              Patient verbalizes understanding of instructions and care plan provided today and agrees to view in MyChart. Active MyChart status and patient understanding of how to access instructions and care plan via MyChart confirmed with patient.     The patient has been provided with contact information for the care management team and has been advised to call with any health related questions or concerns.   Please call the care guide team at [phone] if you need to cancel or reschedule your appointment.   Please call 1-800-273-TALK (toll free, 24 hour hotline) if you are experiencing a Mental Health or Behavioral Health Crisis or need someone to talk to.   Codie Krogh A. Gordy RN, BA, [facility], CRRN [place]  [facility] Population [facility] RN Care Manager Direct Dial: [phone]  Fax: [phone]

## 2024-06-14 NOTE — Patient Outreach (Signed)
 Complex Care Management   Visit Note  06/14/2024  Name:  [name] MRN: [id number] DOB: 28-Aug-1961  Situation: Referral received for Complex Care Management related to Lumbar post-laminectomy syndrome, Chronic Pain, HX Depression/GAD I obtained verbal consent from Patient.  Visit completed with Patient  on the phone  Background:   Past Medical History:  Diagnosis Date   Anxiety    Aortic atherosclerosis (HCC) 04/16/2021   Atypical angina (HCC)    Back pain    related to spinal stenosis and disc problem, radiates down left buttocks to leg., weakness occ.   Chest pain    a. 03/2015 Cath: nl cors; b. 03/2021 Cor CTA: Ca2+ = 0. Nl Cors.   Chronic pain syndrome    Dyspnea    hx   GERD (gastroesophageal reflux disease)    Grade I diastolic dysfunction    Headache    Hyperlipidemia    Hypertension    Lumbar post-laminectomy syndrome    LVH (left ventricular hypertrophy) 12/15/2020   a. 11/2020 Echo: EF 65-70%, no rwma, sev asymm LVH with IVSd 1.9 cm. No LVOT obs @ rest. Gr1 DD. Triv MR.   PONV (postoperative nausea and vomiting)    Pulmonary nodules    a. 03/2021 CT Chest: 3mm pulm nodules in bilat lower lobes. F/u 1 yr.   Right foot drop    Syncope    a. 11/2020 Zio: No significant arrhythmias.   Vaginal foreign object    Uses Femring     Assessment: Patient Reported Symptoms:  Cognitive Cognitive Status: Alert and oriented to person, place, and time, Normal speech and language skills, Insightful and able to interpret abstract concepts Cognitive/Intellectual Conditions Management [RPT]: None reported or documented in medical history or problem list   Health Maintenance Behaviors: Exercise, Social activities, Sleep adequate, Spiritual practice(s), Annual physical exam, Healthy diet Healing Pattern: Slow Health Facilitated by: Pain control, Healthy diet, Prayer/meditation, Rest  Neurological Neurological Review of Symptoms: No symptoms reported    HEENT HEENT  Symptoms Reported: No symptoms reported      Cardiovascular Cardiovascular Symptoms Reported: No symptoms reported    Respiratory Respiratory Symptoms Reported: Productive cough, Other: Other Respiratory Symptoms: 8/19/ RNCM appt - patient is presently recovering from a severe URI for which she sought tx from her PCP (neg Covid, neg Flu) and is foregoing PT treatment while she rests and recovers. States coughing really aggravates her back pain from recent surgery. Encouraged splinting with a pillow when coughing. Was also given an RX for Hycodan cough syrup to help with cough/pain. Respiratory Management Strategies: Adequate rest, Coping strategies, Medication therapy Respiratory Self-Management Outcome: 4 (good)  Endocrine Endocrine Symptoms Reported: No symptoms reported Is patient diabetic?: No    Gastrointestinal Gastrointestinal Symptoms Reported: No symptoms reported      Genitourinary Genitourinary Symptoms Reported: No symptoms reported    Integumentary Integumentary Symptoms Reported: No symptoms reported    Musculoskeletal Musculoskelatal Symptoms Reviewed: Back pain, Muscle pain, Unsteady gait Additional Musculoskeletal Details: essentially no change in symptoms from last engagement other than coughing from current URI is aggravating her back pain, was given RX for Hycodan cough syrup to be used as needed - also encouraged splinting with a pillow when coughing. Musculoskeletal Management Strategies: Medical device, Medication therapy, Coping strategies, Adequate rest, Exercise, Routine screening, Activity Musculoskeletal Self-Management Outcome: 4 (good) Falls in the past year?: No    Psychosocial Psychosocial Symptoms Reported: No symptoms reported Behavioral Management Strategies: Medication therapy, Adequate rest, Coping strategies, Support system Behavioral Health  Self-Management Outcome: 4 (good) Major Change/Loss/Stressor/Fears (CP): Medical condition, self Techniques to  Cope with Loss/Stress/Change: Diversional activities, Exercise, Spiritual practice(s), Medication Quality of Family Relationships: supportive, helpful, involved Do you feel physically threatened by others?: No    06/14/2024    PHQ2-9 Depression Screening   Little interest or pleasure in doing things Not at all  Feeling down, depressed, or hopeless Not at all  PHQ-2 - Total Score 0  Trouble falling or staying asleep, or sleeping too much    Feeling tired or having little energy    Poor appetite or overeating     Feeling bad about yourself - or that you are a failure or have let yourself or your family down    Trouble concentrating on things, such as reading the newspaper or watching television    Moving or speaking so slowly that other people could have noticed.  Or the opposite - being so fidgety or restless that you have been moving around a lot more than usual    Thoughts that you would be better off dead, or hurting yourself in some way    PHQ2-9 Total Score    If you checked off any problems, how difficult have these problems made it for you to do your work, take care of things at home, or get along with other people    Depression Interventions/Treatment      There were no vitals filed for this visit.  Medications Reviewed Today     Reviewed by Gordy Channing LABOR, RN (Registered Nurse) on 06/14/24 at 1112  Med List Status: <None>   Medication Order Taking? Sig Documenting Provider Last Dose Status Informant  acetaminophen  (TYLENOL ) 500 MG tablet [id number] Yes Take 500 mg by mouth every 6 (six) hours as needed for headache. [provider]  Active Self  ALPRAZolam  (XANAX ) 0.5 MG tablet [id number] Yes Take 1/2 - 1 tablet by mouth at bedtime as needed for anxiety. [place], [name], MD  Active   amitriptyline  (ELAVIL ) 25 MG tablet [id number] Yes Take 1 tablet (25 mg total) by mouth daily.   Active Self  amLODipine  (NORVASC ) 5 MG tablet [id number] Yes Take 1 tablet (5 mg total) by  mouth daily. Mona Vinie BROCKS, MD  Active Self  atorvastatin  (LIPITOR) 20 MG tablet [id number] Yes Take 1 tablet (20 mg total) by mouth daily. [place], [name], MD  Active Self  baclofen  (LIORESAL ) 10 MG tablet [id number] Yes Take 1 tablet (10 mg total) by mouth 3 (three) times daily as needed. Babs Arthea DASEN, MD  Active Self           Med Note JERALYN, FLORIDA A   Mon Nov 30, 2023  1:44 PM) Pt alternates takes baclofen  and methocarbamol    betamethasone  dipropionate 0.05 % cream [id number] Yes Apply as directed to skin once a day to itchy areas only, avoiding normal skin, as needed.   Active   carvedilol  (COREG ) 25 MG tablet [id number] Yes Take 1 tablet (25 mg total) by mouth 2 (two) times daily with a meal. Vannie Reche RAMAN, NP  Active   cholecalciferol  (VITAMIN D3) 25 MCG (1000 UNIT) tablet [id number] Yes Take 1,000 Units by mouth daily. [provider]  Active Self  cromolyn  (OPTICROM ) 4 % ophthalmic solution [id number] Yes Place 1-2 drops into both eyes 4 (four) times daily as needed. [place], [name], MD  Active   diphenhydrAMINE  (BENADRYL ) 25 MG tablet [id number] Yes Take 25 mg by mouth daily as needed for itching. [provider]  Active Self  docusate sodium  (COLACE) 100 MG capsule [id number] Yes Take 100 mg by mouth 2 (two) times daily. [provider]  Active   DULoxetine  (CYMBALTA ) 30 MG capsule [id number] Yes Take 2 capsules (60 mg total) by mouth daily. Babs Arthea DASEN, MD  Active Self           Med Note (ROSE, ALAN RAYMOND Kitchens Dec 14, 2023  9:30 AM) Patient is taking 30 mg once a day.   Estradiol  Acetate (FEMRING ) 0.05 MG/24HR RING [id number] Yes USE ONE RING VAGINALLY EVERY 3 MONTHS AS DIRECTED   Active   fluconazole  (DIFLUCAN ) 200 MG tablet [id number]  Take 1 tablet by mouth once a week  Patient not taking: Reported on 06/14/2024     Active Self  gabapentin  (NEURONTIN ) 600 MG tablet [id number] Yes Take 1 tablet (600 mg total) by mouth 3 (three) times daily. Babs Arthea DASEN, MD  Active Self  hydrALAZINE  (APRESOLINE ) 50 MG tablet [id number]  Take 1 tablet (50 mg total) by mouth 3 (three) times daily.  Patient taking differently: Take 50 mg by mouth 2 (two) times daily.   West, Katlyn D, NP  Expired 05/04/24 2359 Self           Med Note (ROSE, AMANDA U   Mon Dec 14, 2023  9:31 AM) Reports taking 2 times per day  HYDROcodone  bit-homatropine (HYCODAN) 5-1.5 MG/5ML syrup [id number] Yes Take 5 mLs by mouth every 8 (eight) hours as needed for cough. Merna Huxley, NP  Active     Discontinued 04/21/24 1712 (Allergic reaction)            Med Note>> Gordy Channing LABOR, RN   02/26/2024  9:13 AM Itching, Hives, Breathing Problems    methocarbamol  (ROBAXIN ) 500 MG tablet [id number] Yes Take 1 tablet (500 mg total) by mouth 3 (three) times daily as needed for muscle spasm. Babs Arthea DASEN, MD  Active   mometasone  (NASONEX ) 50 MCG/ACT nasal spray [id number] Yes Place 2 sprays into the nose daily. [place], [name], MD  Active     Discontinued 09/05/23 1325 (Patient Preference)            Med Note>> Sheela Duwaine CROME, CPhT   09/05/2023  1:25 PM non longer taking    ondansetron  (ZOFRAN ) 4 MG tablet [id number] Yes Take 1 tablet (4 mg tablet) 30 - 60 minutes each prep dose Legrand Victory CROME DOUGLAS, MD  Active Self           Med Note (ROSE, AMANDA U   Wed Jan 27, 2024 11:11 AM) Old RX.  No refills.  predniSONE  (DELTASONE ) 20 MG tablet [id number]  Take 1 tablet (20 mg total) by mouth daily with breakfast.  Patient not taking: Reported on 06/14/2024   Nafziger, Cory, NP  Active   pregabalin  (LYRICA ) 300 MG capsule [id number]  Take 1 capsule (300 mg total) by mouth 2 (two) times daily.  Patient not taking: Reported on 06/14/2024   Camara, Amadou, MD  Expired 05/04/24 2359 Self  spironolactone  (ALDACTONE ) 50 MG tablet [id number] Yes Take 1 tablet (50 mg total) by mouth daily. Mona Vinie BROCKS, MD  Active   tapentadol  (NUCYNTA ) 50 MG tablet [id number] Yes Take 1 tablet (50 mg total) by mouth  every 6 (six) to 8 (eight) hours as needed. Babs Arthea DASEN, MD  Active   terbinafine (LAMISIL) 1 % cream [id number] Yes Apply 1 Application topically 2 (two) times daily as needed (rash). [provider]  Active Self            Recommendation:   Continue Current Plan of Care  Follow Up Plan:   Telephone follow up appointment date/time:  06/30/24 at 10:00 with RN Care Manager Channing LITTIE Channing A. Gordy RN, BA, [facility], CRRN [place]  [facility] Population Health RN Care Manager Direct Dial: [phone]  Fax: [phone]

## 2024-06-16 ENCOUNTER — Telehealth: Payer: Self-pay | Admitting: Adult Health

## 2024-06-16 NOTE — Telephone Encounter (Signed)
 Copied from CRM [id number]. Topic: Clinical - Medication Refill >> Jun 16, 2024 10:42 AM Laymon HERO wrote: Medication: HYDROcodone  bit-homatropine (HYCODAN) 5-1.5 MG/5ML syrup   Has the patient contacted their pharmacy? Yes (Agent: If no, request that the patient contact the pharmacy for the refill. If patient does not wish to contact the pharmacy document the reason why and proceed with request.) (Agent: If yes, when and what did the pharmacy advise?)  This is the patient's preferred pharmacy:  [facility] [facility] [address] [place] [address] Phone: [phone] Fax: [phone]   Is this the correct pharmacy for this prescription? Yes If no, delete pharmacy and type the correct one.   Has the prescription been filled recently? Yes  Is the patient out of the medication? Yes  Has the patient been seen for an appointment in the last year OR does the patient have an upcoming appointment? Yes  Can we respond through MyChart? Yes  Agent: Please be advised that Rx refills may take up to 3 business days. We ask that you follow-up with your pharmacy.

## 2024-06-17 ENCOUNTER — Encounter (HOSPITAL_BASED_OUTPATIENT_CLINIC_OR_DEPARTMENT_OTHER): Payer: Self-pay | Admitting: *Deleted

## 2024-06-17 ENCOUNTER — Emergency Department (HOSPITAL_BASED_OUTPATIENT_CLINIC_OR_DEPARTMENT_OTHER)

## 2024-06-17 ENCOUNTER — Observation Stay (HOSPITAL_BASED_OUTPATIENT_CLINIC_OR_DEPARTMENT_OTHER)
Admission: EM | Admit: 2024-06-17 | Discharge: 2024-06-19 | Disposition: A | Discharge status: Home / Self Care | Attending: Internal Medicine | Admitting: Internal Medicine

## 2024-06-17 ENCOUNTER — Other Ambulatory Visit: Payer: Self-pay

## 2024-06-17 DIAGNOSIS — I82409 Acute embolism and thrombosis of unspecified deep veins of unspecified lower extremity: Secondary | ICD-10-CM | POA: Diagnosis not present

## 2024-06-17 DIAGNOSIS — M4186 Other forms of scoliosis, lumbar region: Secondary | ICD-10-CM | POA: Diagnosis not present

## 2024-06-17 DIAGNOSIS — R531 Weakness: Secondary | ICD-10-CM

## 2024-06-17 DIAGNOSIS — E860 Dehydration: Secondary | ICD-10-CM | POA: Diagnosis not present

## 2024-06-17 DIAGNOSIS — R55 Syncope and collapse: Secondary | ICD-10-CM | POA: Diagnosis not present

## 2024-06-17 DIAGNOSIS — I10 Essential (primary) hypertension: Secondary | ICD-10-CM | POA: Diagnosis not present

## 2024-06-17 DIAGNOSIS — E876 Hypokalemia: Secondary | ICD-10-CM | POA: Diagnosis not present

## 2024-06-17 DIAGNOSIS — Z91040 Latex allergy status: Secondary | ICD-10-CM | POA: Diagnosis not present

## 2024-06-17 DIAGNOSIS — M48061 Spinal stenosis, lumbar region without neurogenic claudication: Secondary | ICD-10-CM | POA: Diagnosis present

## 2024-06-17 DIAGNOSIS — I700 Atherosclerosis of aorta: Secondary | ICD-10-CM | POA: Diagnosis not present

## 2024-06-17 DIAGNOSIS — U071 COVID-19: Principal | ICD-10-CM | POA: Insufficient documentation

## 2024-06-17 DIAGNOSIS — E871 Hypo-osmolality and hyponatremia: Secondary | ICD-10-CM | POA: Insufficient documentation

## 2024-06-17 DIAGNOSIS — M4722 Other spondylosis with radiculopathy, cervical region: Secondary | ICD-10-CM

## 2024-06-17 DIAGNOSIS — M5126 Other intervertebral disc displacement, lumbar region: Secondary | ICD-10-CM | POA: Diagnosis not present

## 2024-06-17 DIAGNOSIS — M21371 Foot drop, right foot: Secondary | ICD-10-CM | POA: Diagnosis not present

## 2024-06-17 DIAGNOSIS — Z79899 Other long term (current) drug therapy: Secondary | ICD-10-CM | POA: Insufficient documentation

## 2024-06-17 DIAGNOSIS — Z981 Arthrodesis status: Secondary | ICD-10-CM | POA: Diagnosis not present

## 2024-06-17 DIAGNOSIS — F32A Depression, unspecified: Secondary | ICD-10-CM | POA: Insufficient documentation

## 2024-06-17 DIAGNOSIS — M6281 Muscle weakness (generalized): Secondary | ICD-10-CM | POA: Insufficient documentation

## 2024-06-17 DIAGNOSIS — M5440 Lumbago with sciatica, unspecified side: Secondary | ICD-10-CM

## 2024-06-17 DIAGNOSIS — R519 Headache, unspecified: Secondary | ICD-10-CM | POA: Diagnosis not present

## 2024-06-17 DIAGNOSIS — R059 Cough, unspecified: Secondary | ICD-10-CM | POA: Diagnosis not present

## 2024-06-17 DIAGNOSIS — J988 Other specified respiratory disorders: Secondary | ICD-10-CM

## 2024-06-17 DIAGNOSIS — W1839XA Other fall on same level, initial encounter: Secondary | ICD-10-CM | POA: Diagnosis not present

## 2024-06-17 DIAGNOSIS — Z1152 Encounter for screening for COVID-19: Secondary | ICD-10-CM | POA: Diagnosis not present

## 2024-06-17 DIAGNOSIS — E785 Hyperlipidemia, unspecified: Secondary | ICD-10-CM | POA: Diagnosis present

## 2024-06-17 DIAGNOSIS — N179 Acute kidney failure, unspecified: Secondary | ICD-10-CM | POA: Diagnosis not present

## 2024-06-17 DIAGNOSIS — M5416 Radiculopathy, lumbar region: Secondary | ICD-10-CM

## 2024-06-17 DIAGNOSIS — M961 Postlaminectomy syndrome, not elsewhere classified: Secondary | ICD-10-CM

## 2024-06-17 LAB — BASIC METABOLIC PANEL WITH GFR
Anion gap: 14 (ref 5–15)
BUN: 11 mg/dL (ref 8–23)
CO2: 22 mmol/L (ref 22–32)
Calcium: 9.5 mg/dL (ref 8.9–10.3)
Chloride: 101 mmol/L (ref 98–111)
Creatinine, Ser: 1.42 mg/dL — ABNORMAL HIGH (ref 0.44–1.00)
GFR, Estimated: 42 mL/min — ABNORMAL LOW (ref >60)
Glucose, Bld: 112 mg/dL — ABNORMAL HIGH (ref 70–99)
Potassium: 3.9 mmol/L (ref 3.5–5.1)
Sodium: 137 mmol/L (ref 135–145)

## 2024-06-17 LAB — CBC WITH DIFFERENTIAL/PLATELET
Abs Immature Granulocytes: 0.05 K/uL (ref 0.00–0.07)
Basophils Absolute: 0.1 K/uL (ref 0.0–0.1)
Basophils Relative: 1 %
Eosinophils Absolute: 0.3 K/uL (ref 0.0–0.5)
Eosinophils Relative: 3 %
HCT: 38.8 % (ref 36.0–46.0)
Hemoglobin: 12.8 g/dL (ref 12.0–15.0)
Immature Granulocytes: 1 %
Lymphocytes Relative: 11 %
Lymphs Abs: 1 K/uL (ref 0.7–4.0)
MCH: 29.8 pg (ref 26.0–34.0)
MCHC: 33 g/dL (ref 30.0–36.0)
MCV: 90.2 fL (ref 80.0–100.0)
Monocytes Absolute: 1.2 K/uL — ABNORMAL HIGH (ref 0.1–1.0)
Monocytes Relative: 13 %
Neutro Abs: 6.2 K/uL (ref 1.7–7.7)
Neutrophils Relative %: 71 %
Platelets: 241 K/uL (ref 150–400)
RBC: 4.3 MIL/uL (ref 3.87–5.11)
RDW: 13.6 % (ref 11.5–15.5)
WBC: 8.7 K/uL (ref 4.0–10.5)
nRBC: 0 % (ref 0.0–0.2)

## 2024-06-17 LAB — RESP PANEL BY RT-PCR (RSV, FLU A&B, COVID)  RVPGX2
Influenza A by PCR: NEGATIVE
Influenza B by PCR: NEGATIVE
Resp Syncytial Virus by PCR: NEGATIVE
SARS Coronavirus 2 by RT PCR: POSITIVE — AB

## 2024-06-17 LAB — URINALYSIS, ROUTINE W REFLEX MICROSCOPIC
Bilirubin Urine: NEGATIVE
Glucose, UA: NEGATIVE mg/dL
Hgb urine dipstick: NEGATIVE
Ketones, ur: NEGATIVE mg/dL
Leukocytes,Ua: NEGATIVE
Nitrite: NEGATIVE
Protein, ur: NEGATIVE mg/dL
Specific Gravity, Urine: 1.009 (ref 1.005–1.030)
pH: 6 (ref 5.0–8.0)

## 2024-06-17 LAB — TROPONIN T, HIGH SENSITIVITY
Troponin T High Sensitivity: <15 ng/L (ref 0–19)
Troponin T High Sensitivity: <15 ng/L (ref 0–19)

## 2024-06-17 MED ORDER — BENZONATATE 100 MG PO CAPS
100.0000 mg | ORAL_CAPSULE | Freq: Three times a day (TID) | ORAL | Status: DC
  Administered 2024-06-17 – 2024-06-19 (×5): 100 mg via ORAL
  Filled 2024-06-17 (×5): qty 1

## 2024-06-17 MED ORDER — TAPENTADOL HCL 50 MG PO TABS
50.0000 mg | ORAL_TABLET | Freq: Four times a day (QID) | ORAL | Status: DC | PRN
  Administered 2024-06-18 – 2024-06-19 (×2): 50 mg via ORAL
  Filled 2024-06-17 (×2): qty 1

## 2024-06-17 MED ORDER — FENTANYL CITRATE PF 50 MCG/ML IJ SOSY
50.0000 ug | PREFILLED_SYRINGE | Freq: Once | INTRAMUSCULAR | Status: AC
  Administered 2024-06-17: 50 ug via INTRAVENOUS
  Filled 2024-06-17: qty 1

## 2024-06-17 MED ORDER — ACETAMINOPHEN 325 MG PO TABS
650.0000 mg | ORAL_TABLET | Freq: Once | ORAL | Status: AC
  Administered 2024-06-17: 650 mg via ORAL
  Filled 2024-06-17: qty 2

## 2024-06-17 MED ORDER — SODIUM CHLORIDE 0.9% FLUSH
3.0000 mL | Freq: Two times a day (BID) | INTRAVENOUS | Status: DC
  Administered 2024-06-17 – 2024-06-19 (×4): 3 mL via INTRAVENOUS

## 2024-06-17 MED ORDER — HYDROCOD POLI-CHLORPHE POLI ER 10-8 MG/5ML PO SUER
5.0000 mL | Freq: Two times a day (BID) | ORAL | Status: DC | PRN
  Administered 2024-06-18 – 2024-06-19 (×3): 5 mL via ORAL
  Filled 2024-06-17 (×3): qty 5

## 2024-06-17 MED ORDER — ONDANSETRON HCL 4 MG/2ML IJ SOLN
4.0000 mg | Freq: Four times a day (QID) | INTRAMUSCULAR | Status: DC | PRN
  Administered 2024-06-17: 4 mg via INTRAVENOUS
  Filled 2024-06-17: qty 2

## 2024-06-17 MED ORDER — AMLODIPINE BESYLATE 5 MG PO TABS: 5.0000 mg | ORAL_TABLET | Freq: Every day | ORAL | Status: DC

## 2024-06-17 MED ORDER — SODIUM CHLORIDE 0.9 % IV BOLUS
1000.0000 mL | Freq: Once | INTRAVENOUS | Status: AC
  Administered 2024-06-17: 1000 mL via INTRAVENOUS

## 2024-06-17 MED ORDER — LACTATED RINGERS IV BOLUS
500.0000 mL | Freq: Once | INTRAVENOUS | Status: AC
  Administered 2024-06-17: 500 mL via INTRAVENOUS

## 2024-06-17 MED ORDER — ATORVASTATIN CALCIUM 10 MG PO TABS
20.0000 mg | ORAL_TABLET | Freq: Every day | ORAL | Status: DC
  Administered 2024-06-18 – 2024-06-19 (×2): 20 mg via ORAL
  Filled 2024-06-17 (×2): qty 2

## 2024-06-17 MED ORDER — ONDANSETRON HCL 4 MG/2ML IJ SOLN
4.0000 mg | Freq: Once | INTRAMUSCULAR | Status: AC
  Administered 2024-06-17: 4 mg via INTRAVENOUS
  Filled 2024-06-17: qty 2

## 2024-06-17 MED ORDER — DULOXETINE HCL 30 MG PO CPEP
30.0000 mg | ORAL_CAPSULE | Freq: Every day | ORAL | Status: DC
  Administered 2024-06-18 – 2024-06-19 (×2): 30 mg via ORAL
  Filled 2024-06-17 (×2): qty 1

## 2024-06-17 MED ORDER — GUAIFENESIN 100 MG/5ML PO LIQD
5.0000 mL | Freq: Once | ORAL | Status: AC
  Administered 2024-06-17: 5 mL via ORAL
  Filled 2024-06-17: qty 10

## 2024-06-17 MED ORDER — ACETAMINOPHEN 325 MG PO TABS
650.0000 mg | ORAL_TABLET | Freq: Four times a day (QID) | ORAL | Status: DC
  Administered 2024-06-17 – 2024-06-19 (×6): 650 mg via ORAL
  Filled 2024-06-17 (×7): qty 2

## 2024-06-17 MED ORDER — GUAIFENESIN ER 600 MG PO TB12
600.0000 mg | ORAL_TABLET | Freq: Two times a day (BID) | ORAL | Status: DC
  Administered 2024-06-17 – 2024-06-18 (×2): 600 mg via ORAL
  Filled 2024-06-17 (×2): qty 1

## 2024-06-17 MED ORDER — AMITRIPTYLINE HCL 50 MG PO TABS
25.0000 mg | ORAL_TABLET | Freq: Every day | ORAL | Status: DC
  Administered 2024-06-18 – 2024-06-19 (×2): 25 mg via ORAL
  Filled 2024-06-17 (×2): qty 1

## 2024-06-17 MED ORDER — ENOXAPARIN SODIUM 40 MG/0.4ML IJ SOSY
40.0000 mg | PREFILLED_SYRINGE | INTRAMUSCULAR | Status: DC
  Administered 2024-06-17 – 2024-06-18 (×2): 40 mg via SUBCUTANEOUS
  Filled 2024-06-17 (×2): qty 0.4

## 2024-06-17 MED ORDER — METHOCARBAMOL 500 MG PO TABS
500.0000 mg | ORAL_TABLET | Freq: Three times a day (TID) | ORAL | Status: DC | PRN
  Administered 2024-06-18: 500 mg via ORAL
  Filled 2024-06-17: qty 1

## 2024-06-17 MED ORDER — GABAPENTIN 600 MG PO TABS: 600.0000 mg | ORAL_TABLET | Freq: Three times a day (TID) | ORAL | Status: DC

## 2024-06-17 MED ORDER — CARVEDILOL 25 MG PO TABS
25.0000 mg | ORAL_TABLET | Freq: Two times a day (BID) | ORAL | Status: DC
  Administered 2024-06-18 – 2024-06-19 (×3): 25 mg via ORAL
  Filled 2024-06-17 (×3): qty 1

## 2024-06-17 MED ORDER — GABAPENTIN 300 MG PO CAPS
300.0000 mg | ORAL_CAPSULE | Freq: Three times a day (TID) | ORAL | Status: DC
  Administered 2024-06-18 – 2024-06-19 (×4): 300 mg via ORAL
  Filled 2024-06-17 (×4): qty 1

## 2024-06-17 MED ORDER — HYDRALAZINE HCL 50 MG PO TABS
50.0000 mg | ORAL_TABLET | Freq: Two times a day (BID) | ORAL | Status: DC
  Administered 2024-06-17 – 2024-06-18 (×2): 50 mg via ORAL
  Filled 2024-06-17 (×2): qty 1

## 2024-06-17 MED ORDER — ALPRAZOLAM 0.25 MG PO TABS
0.2500 mg | ORAL_TABLET | Freq: Every evening | ORAL | Status: DC | PRN
  Administered 2024-06-17: 0.5 mg via ORAL
  Filled 2024-06-17: qty 2

## 2024-06-17 NOTE — ED Notes (Addendum)
 Pt assisted to [facility] with nurse tech x 2 assist. Tolerated well. Bed linen changed.

## 2024-06-17 NOTE — ED Triage Notes (Signed)
 Pt states that she has had syncopal episodes for the past couple weeks. Saw her primary care MD for a cough a few weeks ago and was started on prednisone  and cough medication states she had an xray at that time. Pt still with a cough. Denies a recent fever. States she got up at 2am to use the bathroom. States she passed out States this past Wednesday she also had a syncopal episode. C/o of chest pain with cough.

## 2024-06-17 NOTE — H&P (Signed)
 History and Physical    Patient: [name] FMW:[id number] DOB: October 23, 1961 DOA: 06/17/2024 DOS: the patient was seen and examined on 06/17/2024 PCP: [place], [name], MD  Patient coming from: Home by way of [facility]   Chief Complaint:  Chief Complaint  Patient presents with   Near Syncope   HPI: [name] is a 63 y.o. female with a history of HTN, HLD, DDD s/p lumbar fusion earlier this year, and right foot drop who presented to the ED with weakness and fainting spells in the setting of several days of URI symptoms. She reports a couple days of coughing that worsened, improved with outpatient OTC medications, associated with chest pain that is absent otherwise. No dyspnea except while coughing. She took a course of prednisone  prescribed by a PA at her PCP's office, ended course early this week, didn't feel much better.   Starting at 1:00am she woke up feeling like she got run over by a train and was weak. Went to the bathroom to have BM and also vomited while on the toilet, woke up on the floor. Got husband who activated EMS.  No abd pain, orthopnea, PND. Poor appetite and nausea of late, feels dehydrated. Work up revealed +covid PCR with no pneumonia on CXR. AKI with SCr 1.42, though otherwise work up reassuring. She was afebrile without hypoxemia. When standing to get ambulatory pulse oximetry she felt suddenly too weak to hold her weight up, fell with her bottom on the floor, slowed by RN who attempted to catch her. Subsequent lumbar CT showed no acute findings.   Review of Systems: As mentioned in the history of present illness. All other systems reviewed and are negative. Past Medical History:  Diagnosis Date   Anxiety    Aortic atherosclerosis (HCC) 04/16/2021   Atypical angina (HCC)    Back pain    related to spinal stenosis and disc problem, radiates down left buttocks to leg., weakness occ.   Chest pain    a. 03/2015 Cath: nl cors; b. 03/2021 Cor CTA:  Ca2+ = 0. Nl Cors.   Chronic pain syndrome    Dyspnea    hx   GERD (gastroesophageal reflux disease)    Grade I diastolic dysfunction    Headache    Hyperlipidemia    Hypertension    Lumbar post-laminectomy syndrome    LVH (left ventricular hypertrophy) 12/15/2020   a. 11/2020 Echo: EF 65-70%, no rwma, sev asymm LVH with IVSd 1.9 cm. No LVOT obs @ rest. Gr1 DD. Triv MR.   PONV (postoperative nausea and vomiting)    Pulmonary nodules    a. 03/2021 CT Chest: 3mm pulm nodules in bilat lower lobes. F/u 1 yr.   Right foot drop    Syncope    a. 11/2020 Zio: No significant arrhythmias.   Vaginal foreign object    Uses Femring    Past Surgical History:  Procedure Laterality Date   ABDOMINAL HYSTERECTOMY     ANTERIOR CERVICAL DECOMP/DISCECTOMY FUSION N/A 01/03/2022   Procedure: Anterior Cervical Decompression Fusion - Cervical four-Cervical five - Cervical five-Cervical six;  Surgeon: Joshua Alm RAMAN, MD;  Location: [facility] OR;  Service: Neurosurgery;  Laterality: N/A;   BIOPSY  12/16/2020   Procedure: BIOPSY;  Surgeon: Aneita Gwendlyn DASEN, MD;  Location: [facility] ENDOSCOPY;  Service: Endoscopy;;   CARDIAC CATHETERIZATION N/A 04/18/2015   Procedure: Left Heart Cath and Coronary Angiography;  Surgeon: Rober Chroman, MD;  Location: [facility] INVASIVE CV LAB;  Service: Cardiovascular;  Laterality: N/A;  COLONOSCOPY     COLONOSCOPY W/ BIOPSIES AND POLYPECTOMY  2018   ESOPHAGOGASTRODUODENOSCOPY (EGD) WITH PROPOFOL  N/A 12/16/2020   Procedure: ESOPHAGOGASTRODUODENOSCOPY (EGD) WITH PROPOFOL ;  Surgeon: Aneita Gwendlyn DASEN, MD;  Location: [facility] ENDOSCOPY;  Service: Endoscopy;  Laterality: N/A;   FOOT SURGERY Bilateral    [facility] bunion,bone spur, tendon (1) -6'16, (1)-10'16   HEMATOMA EVACUATION N/A 01/05/2022   Procedure: Cervical Wound Exploration;  Surgeon: Gillie Duncans, MD;  Location: [facility] OR;  Service: Neurosurgery;  Laterality: N/A;   IR EPIDUROGRAPHY  07/21/2018   LUMBAR LAMINECTOMY/DECOMPRESSION  MICRODISCECTOMY Bilateral 12/28/2015   Procedure: MICRO LUMBAR DECOMPRESSION L4 - L5 BILATERALLY;  Surgeon: Reyes Billing, MD;  Location: [facility] ORS;  Service: Orthopedics;  Laterality: Bilateral;   LUMBAR LAMINECTOMY/DECOMPRESSION MICRODISCECTOMY Bilateral 03/04/2018   Procedure: Revision of Microlumbar Decompression Bilateral Lumbar Four-Five;  Surgeon: Billing Reyes, MD;  Location: [facility] OR;  Service: Orthopedics;  Laterality: Bilateral;  90 mins   LUMBAR WOUND DEBRIDEMENT N/A 12/25/2023   Procedure: Irrigation and debridement of lumbar wound;  Surgeon: Joshua Alm Hamilton, MD;  Location: [facility] OR;  Service: Neurosurgery;  Laterality: N/A;   POSTERIOR FUSION PEDICLE SCREW PLACEMENT N/A 12/07/2023   Procedure: LUMBAR FOUR-FIVE POSTERIOR LATERAL FUSION;  Surgeon: Joshua Alm Hamilton, MD;  Location: [facility] OR;  Service: Neurosurgery;  Laterality: N/A;   SAVORY DILATION N/A 12/16/2020   Procedure: SAVORY DILATION;  Surgeon: Aneita Gwendlyn DASEN, MD;  Location: [facility] ENDOSCOPY;  Service: Endoscopy;  Laterality: N/A;   SPINAL CORD STIMULATOR INSERTION N/A 09/28/2019   Procedure: THORACIC SPINAL CORD STIMULATOR INSERTION;  Surgeon: Burnetta Aures, MD;  Location: [facility] OR;  Service: Orthopedics;  Laterality: N/A;  2.5 hrs   SPINAL CORD STIMULATOR REMOVAL N/A 05/27/2021   Procedure: LUMBAR SPINAL CORD STIMULATOR REMOVAL;  Surgeon: Bluford Standing, MD;  Location: [facility] ORS;  Service: Neurosurgery;  Laterality: N/A;   TUBAL LIGATION     WISDOM TOOTH EXTRACTION     WOUND EXPLORATION N/A 03/04/2018   Procedure: EXPLORATION OF LUMBAR DECOMPRESSION WOUND;  Surgeon: Billing Reyes, MD;  Location: [facility] OR;  Service: Orthopedics;  Laterality: N/A;   Social History:  reports that she has never smoked. She has never used smokeless tobacco. She reports that she does not drink alcohol and does not use drugs.  Allergies  Allergen Reactions   Cephalosporins Anaphylaxis   Penicillins Anaphylaxis and Hives   Anesthetics, Amide Nausea And Vomiting     Does not know name of it. [facility].    Betadine [Povidone Iodine] Other (See Comments)    Skin Burn caused scar on Left buttock   Latex Hives, Itching, Rash and Other (See Comments)   Peach [Prunus Persica] Hives   Xyzal  [Levocetirizine] Itching    Patient took Xyzal  for the first time to see if med would help her seasonal rhinitis d/t increased pollen - patient stated that she experienced intense itching over her entire body   Claritin [Loratadine]     pruritis    Hydrocodone      Altered mental state, feel spaced out     Family History  Problem Relation Age of Onset   Heart attack Mother    Lung cancer Father    Cancer Father    Pancreatic cancer Sister    Breast cancer Sister 55   Multiple myeloma Sister    Breast cancer Sister        diagnosed in her 65's   Heart attack Sister    Throat cancer  Brother    Lung cancer Brother    Stomach cancer Cousin    Colon cancer Neg Hx    Colon polyps Neg Hx    Esophageal cancer Neg Hx    Rectal cancer Neg Hx     Prior to Admission medications   Medication Sig Start Date End Date Taking? Authorizing Provider  acetaminophen  (TYLENOL ) 500 MG tablet Take 500 mg by mouth every 6 (six) hours as needed for headache.    [provider]  ALPRAZolam  (XANAX ) 0.5 MG tablet Take 1/2 - 1 tablet by mouth at bedtime as needed for anxiety. 03/25/24   [place], [name], MD  amitriptyline  (ELAVIL ) 25 MG tablet Take 1 tablet (25 mg total) by mouth daily. 08/13/23     amLODipine  (NORVASC ) 5 MG tablet Take 1 tablet (5 mg total) by mouth daily. 06/10/23 07/21/24  Mona Vinie BROCKS, MD  atorvastatin  (LIPITOR) 20 MG tablet Take 1 tablet (20 mg total) by mouth daily. 11/10/23   [place], [name], MD  baclofen  (LIORESAL ) 10 MG tablet Take 1 tablet (10 mg total) by mouth 3 (three) times daily as needed. 10/13/23   Babs Arthea DASEN, MD  betamethasone  dipropionate 0.05 % cream Apply as directed to skin once a day to itchy  areas only, avoiding normal skin, as needed. 05/31/24     carvedilol  (COREG ) 25 MG tablet Take 1 tablet (25 mg total) by mouth 2 (two) times daily with a meal. 04/08/24 04/08/25  Vannie Reche RAMAN, NP  cholecalciferol  (VITAMIN D3) 25 MCG (1000 UNIT) tablet Take 1,000 Units by mouth daily.    [provider]  cromolyn  (OPTICROM ) 4 % ophthalmic solution Place 1-2 drops into both eyes 4 (four) times daily as needed. 01/29/24   [place], [name], MD  diphenhydrAMINE  (BENADRYL ) 25 MG tablet Take 25 mg by mouth daily as needed for itching.    [provider]  docusate sodium  (COLACE) 100 MG capsule Take 100 mg by mouth 2 (two) times daily.    [provider]  DULoxetine  (CYMBALTA ) 30 MG capsule Take 2 capsules (60 mg total) by mouth daily. 09/30/23   Babs Arthea DASEN, MD  Estradiol  Acetate (FEMRING ) 0.05 MG/24HR RING USE ONE RING VAGINALLY EVERY 3 MONTHS AS DIRECTED 03/22/24     fluconazole  (DIFLUCAN ) 200 MG tablet Take 1 tablet by mouth once a week Patient not taking: Reported on 06/14/2024 11/02/23     gabapentin  (NEURONTIN ) 600 MG tablet Take 1 tablet (600 mg total) by mouth 3 (three) times daily. 09/11/23   Babs Arthea DASEN, MD  hydrALAZINE  (APRESOLINE ) 50 MG tablet Take 1 tablet (50 mg total) by mouth 3 (three) times daily. Patient taking differently: Take 50 mg by mouth 2 (two) times daily. 11/12/23 05/04/24  West, Katlyn D, NP  HYDROcodone  bit-homatropine (HYCODAN) 5-1.5 MG/5ML syrup Take 5 mLs by mouth every 8 (eight) hours as needed for cough. 06/03/24   Nafziger, Darleene, NP  methocarbamol  (ROBAXIN ) 500 MG tablet Take 1 tablet (500 mg total) by mouth 3 (three) times daily as needed for muscle spasm. 04/18/24   Babs Arthea DASEN, MD  mometasone  (NASONEX ) 50 MCG/ACT nasal spray Place 2 sprays into the nose daily. 01/29/24   [place], [name], MD  ondansetron  (ZOFRAN ) 4 MG tablet Take 1 tablet (4 mg tablet) 30 - 60 minutes each prep dose 09/01/22   Legrand Victory LITTIE DOUGLAS, MD  predniSONE  (DELTASONE )  20 MG tablet Take 1 tablet (20 mg total) by mouth daily with breakfast. Patient not  taking: Reported on 06/14/2024 06/03/24   Merna Huxley, NP  pregabalin  (LYRICA ) 300 MG capsule Take 1 capsule (300 mg total) by mouth 2 (two) times daily. Patient not taking: Reported on 06/14/2024 08/07/23 05/04/24  Camara, Amadou, MD  spironolactone  (ALDACTONE ) 50 MG tablet Take 1 tablet (50 mg total) by mouth daily. 01/15/24 01/09/25  Mona Vinie BROCKS, MD  tapentadol  (NUCYNTA ) 50 MG tablet Take 1 tablet (50 mg total) by mouth every 6 (six) to 8 (eight) hours as needed. 05/04/24   Swartz, Zachary T, MD  terbinafine (LAMISIL) 1 % cream Apply 1 Application topically 2 (two) times daily as needed (rash).    [provider]  levocetirizine (XYZAL  ALLERGY 24HR) 5 MG tablet Take 1 tablet (5 mg total) by mouth every evening. 01/29/24 04/21/24  [place], [name], MD  omeprazole  (PRILOSEC) 40 MG capsule Take 1 capsule (40 mg total) by mouth daily. 04/14/23 09/05/23  [place], [name], MD    Physical Exam: Vitals:   06/17/24 1415 06/17/24 1530 06/17/24 1615 06/17/24 1623  BP: (!) 116/90 119/62 118/72   Pulse: 94 98 99   Resp:  18    Temp:    99.1 F (37.3 C)  TempSrc:    Oral  SpO2: 99% 98% 100%   Weight:      Height:      Gen: Tired but nontoxic female in no distress Pulm: Clear, nonlabored  CV: RRR, no MRG GI: Soft, NT, ND, +BS  Neuro: Alert and oriented. Right foot drop stable. No new focal deficits. Ext: Warm, no deformities. Skin: No rashes, lesions or ulcers on visualized skin   Data Reviewed: SCr 1.42 CBC normal, Troponin undetectable x2. Glucose 112. +SARS-CoV-2 PCR.  UA dipstick negative.  CT head: No acute intracranial findings. +Sinusitis CT lumbar spine: No acute osseous findings, L4-L5 decompression and fusion with no adverse hardware features. And lumbar spine degeneration elsewhere appears stable from march MRI, with multifactorial lumbar spinal stenosis at L2-L3 and L3-L4. Port CXR: No  infiltrate ECG: NSR  Assessment and Plan: Covid-19 infection:  - Supportive care with antitussive , mucolytic, tylenol  scheduled, VTE ppx.  - No indication for steroids at this time.  - Really no role for antiviral Tx - Airborne/contact isolation  Weakness: Acute due to viral illness on chronic due to spinal disease, chronic right foot drop.  - PT/OT in AM  AKI, dehydration: Cr 1.42 from prior of 1.1.  - Given some IVF in ED. Will give 500cc again, allow a diet supported with antiemetics and recheck BMP in AM.  - Plan to hold spironolactone  for now  Syncope: Loss of consciousness while vomiting and having BM. ECG NSR, last echo showed preserved LVEF and mild basal-septal hypertrophy with normal diastolic parameters. No significant valvulopathy.  - Monitor telemetry.  - Fall precautions - Orthostatics in AM  HTN:  - Continue home norvasc , coreg , hydralazine   DDD:  - Continue nucynta  if available per PM&R outpatient recommendations. Also confirmed on PDMP review.  - Given acute fall, despite negative acute changes on CT, will give muscle relaxer prn.     Advance Care Planning: Full code  Consults: None  Family Communication: None  Severity of Illness: The appropriate patient status for this patient is OBSERVATION. Observation status is judged to be reasonable and necessary in order to provide the required intensity of service to ensure the patient's safety. The patient's presenting symptoms, physical exam findings, and initial radiographic and laboratory data in the context of their medical condition is  felt to place them at decreased risk for further clinical deterioration. Furthermore, it is anticipated that the patient will be medically stable for discharge from the hospital within 2 midnights of admission.   Author: Bernardino KATHEE Come, MD 06/17/2024 8:46 PM  For on call review [email or web address].

## 2024-06-17 NOTE — Plan of Care (Signed)
 Patient is a 63 year old female with history of degenerative disc disease, hypertension, hyperlipidemia who presented from home to emergency department at [facility] with complaint of upper respiratory symptoms, cough, 3 episodes of syncope at home.  Syncopal episode happened while she was in the bathroom for bowel movements.  She felt lightheaded. EKG did not show any signs of arrhythmia or ischemic changes.  Labs showed creatinine of 1.4.  COVID screen test, to be positive.  Patient looked dehydrated.  CXR negative  for pneumonia.  CT head, lumbar spine did not show an acute findings but showed chronic degenerative changes, chronic spinal stenosis. Patient was too weak to be discharged from ED.  She was requested to be placed in observation for further evaluation of syncopal episode likely secondary to dehydration, needing IV fluid.

## 2024-06-17 NOTE — ED Provider Notes (Signed)
 [place] EMERGENCY DEPARTMENT AT [facility] Provider Note   CSN: [id number] Arrival date & time: 06/17/24  9468     Patient presents with: Near Syncope   [name] is a 63 y.o. female.   HPI     This is a 63 year old female who presents with cough and passing out.  Patient reports that she started out with upper respiratory symptoms last week.  She developed cough.  She had an outpatient x-ray that ruled out pneumonia.  She was started on prednisone  and was given something for her cough.  She states that twice last week and once tonight she had episodes of syncope.  These episodes were preceded by bowel movements.  She states that she did feel lightheaded.  Tonight she states that she fell to the floor and her bowel movement was all over the floor.  She did not particularly strain.  This is never happened to her before.  She feels like she has stayed hydrated.  She states occasionally she feels lightheaded upon standing.  Reports chest pain but only with cough.  Prior to Admission medications   Medication Sig Start Date End Date Taking? Authorizing Provider  acetaminophen  (TYLENOL ) 500 MG tablet Take 500 mg by mouth every 6 (six) hours as needed for headache.    [provider]  ALPRAZolam  (XANAX ) 0.5 MG tablet Take 1/2 - 1 tablet by mouth at bedtime as needed for anxiety. 03/25/24   [place], [name], MD  amitriptyline  (ELAVIL ) 25 MG tablet Take 1 tablet (25 mg total) by mouth daily. 08/13/23     amLODipine  (NORVASC ) 5 MG tablet Take 1 tablet (5 mg total) by mouth daily. 06/10/23 07/21/24  Mona Vinie BROCKS, MD  atorvastatin  (LIPITOR) 20 MG tablet Take 1 tablet (20 mg total) by mouth daily. 11/10/23   [place], [name], MD  baclofen  (LIORESAL ) 10 MG tablet Take 1 tablet (10 mg total) by mouth 3 (three) times daily as needed. 10/13/23   Babs Arthea DASEN, MD  betamethasone  dipropionate 0.05 % cream Apply as directed to skin once a day to itchy areas only,  avoiding normal skin, as needed. 05/31/24     carvedilol  (COREG ) 25 MG tablet Take 1 tablet (25 mg total) by mouth 2 (two) times daily with a meal. 04/08/24 04/08/25  Vannie Reche RAMAN, NP  cholecalciferol  (VITAMIN D3) 25 MCG (1000 UNIT) tablet Take 1,000 Units by mouth daily.    [provider]  cromolyn  (OPTICROM ) 4 % ophthalmic solution Place 1-2 drops into both eyes 4 (four) times daily as needed. 01/29/24   [place], [name], MD  diphenhydrAMINE  (BENADRYL ) 25 MG tablet Take 25 mg by mouth daily as needed for itching.    [provider]  docusate sodium  (COLACE) 100 MG capsule Take 100 mg by mouth 2 (two) times daily.    [provider]  DULoxetine  (CYMBALTA ) 30 MG capsule Take 2 capsules (60 mg total) by mouth daily. 09/30/23   Babs Arthea DASEN, MD  Estradiol  Acetate (FEMRING ) 0.05 MG/24HR RING USE ONE RING VAGINALLY EVERY 3 MONTHS AS DIRECTED 03/22/24     fluconazole  (DIFLUCAN ) 200 MG tablet Take 1 tablet by mouth once a week Patient not taking: Reported on 06/14/2024 11/02/23     gabapentin  (NEURONTIN ) 600 MG tablet Take 1 tablet (600 mg total) by mouth 3 (three) times daily. 09/11/23   Babs Arthea DASEN, MD  hydrALAZINE  (APRESOLINE ) 50 MG tablet Take 1 tablet (50 mg total) by mouth 3 (three) times daily.  Patient taking differently: Take 50 mg by mouth 2 (two) times daily. 11/12/23 05/04/24  West, Katlyn D, NP  HYDROcodone  bit-homatropine (HYCODAN) 5-1.5 MG/5ML syrup Take 5 mLs by mouth every 8 (eight) hours as needed for cough. 06/03/24   Nafziger, Darleene, NP  methocarbamol  (ROBAXIN ) 500 MG tablet Take 1 tablet (500 mg total) by mouth 3 (three) times daily as needed for muscle spasm. 04/18/24   Babs Arthea DASEN, MD  mometasone  (NASONEX ) 50 MCG/ACT nasal spray Place 2 sprays into the nose daily. 01/29/24   [place], [name], MD  ondansetron  (ZOFRAN ) 4 MG tablet Take 1 tablet (4 mg tablet) 30 - 60 minutes each prep dose 09/01/22   Legrand Victory LITTIE DOUGLAS, MD  predniSONE  (DELTASONE ) 20 MG tablet  Take 1 tablet (20 mg total) by mouth daily with breakfast. Patient not taking: Reported on 06/14/2024 06/03/24   Nafziger, Cory, NP  pregabalin  (LYRICA ) 300 MG capsule Take 1 capsule (300 mg total) by mouth 2 (two) times daily. Patient not taking: Reported on 06/14/2024 08/07/23 05/04/24  Camara, Amadou, MD  spironolactone  (ALDACTONE ) 50 MG tablet Take 1 tablet (50 mg total) by mouth daily. 01/15/24 01/09/25  Mona Vinie BROCKS, MD  tapentadol  (NUCYNTA ) 50 MG tablet Take 1 tablet (50 mg total) by mouth every 6 (six) to 8 (eight) hours as needed. 05/04/24   Swartz, Zachary T, MD  terbinafine (LAMISIL) 1 % cream Apply 1 Application topically 2 (two) times daily as needed (rash).    [provider]  levocetirizine (XYZAL  ALLERGY 24HR) 5 MG tablet Take 1 tablet (5 mg total) by mouth every evening. 01/29/24 04/21/24  [place], [name], MD  omeprazole  (PRILOSEC) 40 MG capsule Take 1 capsule (40 mg total) by mouth daily. 04/14/23 09/05/23  [place], [name], MD    Allergies: Cephalosporins; Penicillins; Anesthetics, amide; Betadine [povidone iodine]; Latex; Peach [prunus persica]; Xyzal  [levocetirizine]; Claritin [loratadine]; and Hydrocodone     Review of Systems  Constitutional:  Negative for fever.  Respiratory:  Positive for cough. Negative for shortness of breath.   Cardiovascular:  Positive for chest pain.  Neurological:  Positive for syncope and light-headedness.  All other systems reviewed and are negative.   Updated Vital Signs BP (!) 150/87 (BP Location: Right Arm)   Pulse 93   Temp 98.5 F (36.9 C)   Resp 20   Ht 1.651 m (5' 5)   Wt 74.4 kg   SpO2 100%   BMI 27.29 kg/m   Physical Exam Vitals and nursing note reviewed.  Constitutional:      Appearance: She is well-developed. She is obese. She is not ill-appearing.  HENT:     Head: Normocephalic and atraumatic.  Eyes:     Pupils: Pupils are equal, round, and reactive to light.  Cardiovascular:     Rate and Rhythm: Normal rate and  regular rhythm.     Heart sounds: Normal heart sounds.  Pulmonary:     Effort: Pulmonary effort is normal. No respiratory distress.     Breath sounds: No wheezing.  Abdominal:     General: Bowel sounds are normal.     Palpations: Abdomen is soft.     Tenderness: There is no abdominal tenderness. There is no guarding or rebound.  Musculoskeletal:     Cervical back: Neck supple.  Skin:    General: Skin is warm and dry.  Neurological:     Mental Status: She is alert and oriented to person, place, and time.  Psychiatric:  Mood and Affect: Mood normal.     (all labs ordered are listed, but only abnormal results are displayed) Labs Reviewed  CBC WITH DIFFERENTIAL/PLATELET - Abnormal; Notable for the following components:      Result Value   Monocytes Absolute 1.2 (*)    All other components within normal limits  BASIC METABOLIC PANEL WITH GFR - Abnormal; Notable for the following components:   Glucose, Bld 112 (*)    Creatinine, Ser 1.42 (*)    GFR, Estimated 42 (*)    All other components within normal limits  RESP PANEL BY RT-PCR (RSV, FLU A&B, COVID)  RVPGX2  TROPONIN T, HIGH SENSITIVITY    EKG: None  Radiology: No results found.   Procedures   Medications Ordered in the ED  acetaminophen  (TYLENOL ) tablet 650 mg (has no administration in time range)  sodium chloride  0.9 % bolus 1,000 mL (1,000 mLs Intravenous New Bag/Given 06/17/24 [id number])                                    Medical Decision Making Amount and/or Complexity of Data Reviewed Labs: ordered. Radiology: ordered.  Risk OTC drugs.   This patient presents to the ED for concern of syncope, this involves an extensive number of treatment options, and is a complaint that carries with it a high risk of complications and morbidity.  I considered the following differential and admission for this acute, potentially life threatening condition.  The differential diagnosis includes vasovagal syncope,  arrhythmia, dehydration, acute illness such as viral illness or pneumonia  MDM:    This is a 63 year old female who presents with cough and syncope.  Third episode of syncope tonight.  It is following bowel movements each time.  She does have a prodrome.  This would make arrhythmia less likely and it seems vasovagal in nature.  However, EKG was obtained.  No evidence of acute arrhythmia.  Basic lab work obtained.  Mild AKI which may suggest some mild dehydration.  Patient was given fluids.  Chest x-ray and CT head pending.  (Labs, imaging, consults)  Labs: I Ordered, and personally interpreted labs.  The pertinent results include: CBC, BMP, troponin, urinalysis  Imaging Studies ordered: I ordered imaging studies including chest x-ray, CT head I independently visualized and interpreted imaging. I agree with the radiologist interpretation  Additional history obtained from chart review.  External records from outside source obtained and reviewed including prior evaluations  Cardiac Monitoring: The patient was maintained on a cardiac monitor.  If on the cardiac monitor, I personally viewed and interpreted the cardiac monitored which showed an underlying rhythm of: Sinus  Reevaluation: After the interventions noted above, I reevaluated the patient and found that they have :stayed the same  Social Determinants of Health:  lives independently  Disposition: Pending, signed out to oncoming provider  Co morbidities that complicate the patient evaluation  Past Medical History:  Diagnosis Date   Anxiety    Aortic atherosclerosis (HCC) 04/16/2021   Atypical angina (HCC)    Back pain    related to spinal stenosis and disc problem, radiates down left buttocks to leg., weakness occ.   Chest pain    a. 03/2015 Cath: nl cors; b. 03/2021 Cor CTA: Ca2+ = 0. Nl Cors.   Chronic pain syndrome    Dyspnea    hx   GERD (gastroesophageal reflux disease)    Grade I diastolic dysfunction  Headache     Hyperlipidemia    Hypertension    Lumbar post-laminectomy syndrome    LVH (left ventricular hypertrophy) 12/15/2020   a. 11/2020 Echo: EF 65-70%, no rwma, sev asymm LVH with IVSd 1.9 cm. No LVOT obs @ rest. Gr1 DD. Triv MR.   PONV (postoperative nausea and vomiting)    Pulmonary nodules    a. 03/2021 CT Chest: 3mm pulm nodules in bilat lower lobes. F/u 1 yr.   Right foot drop    Syncope    a. 11/2020 Zio: No significant arrhythmias.   Vaginal foreign object    Uses Femring      Medicines Meds ordered this encounter  Medications   sodium chloride  0.9 % bolus 1,000 mL   acetaminophen  (TYLENOL ) tablet 650 mg    I have reviewed the patients home medicines and have made adjustments as needed  Problem List / ED Course: Problem List Items Addressed This Visit   None            Final diagnoses:  None    ED Discharge Orders     None          Bari Charmaine FALCON, MD 06/17/24 [phone]

## 2024-06-17 NOTE — ED Provider Notes (Signed)
  Physical Exam  BP (!) 165/72   Pulse 91   Temp (!) 97.5 F (36.4 C) (Oral)   Resp 19   Ht 5' 5 (1.651 m)   Wt 74.4 kg   SpO2 94%   BMI 27.29 kg/m   Physical Exam  Procedures  Procedures  ED Course / MDM    Medical Decision Making Amount and/or Complexity of Data Reviewed Labs: ordered. Radiology: ordered.  Risk OTC drugs. Prescription drug management. Decision regarding hospitalization.   Received in signout.  Syncope.  Positive COVID test.  Creatinine mildly increased from baseline up to 1.4.  Troponin normal.  However COVID is think the most likely cause of her send B.  Fluid being given.  Patient states that she was feeling too bad to be discharged home.  States she thinks she needs to come to the hospital for couple days.  Discussed with patient about how we will need admission criteria such as ambulatory pulse ox or dizziness.  Attempted to walk patient and she slid off the bed and onto her back.  States her back hurts where she has had a previous fusion.  States she was dizzy.  Will get CT scan to evaluate will give pain medicines and discussed with hospitalist for admission after CT scan done.  CT scan done reassuring.        Patsey Lot, MD 06/17/24 1213

## 2024-06-17 NOTE — ED Notes (Signed)
 Pt experienced assisted fall to floor from bed when RT Robin attempted to ambulate pt with pulse ox per EDP order. Pt examined by EDP Pickering at bedside post fall.  Pt aox4, denies head trauma. Pt c/o back pain, endorses back pain baseline d/t spinal fusion. Pt denies neck pain. Skin assessment complete, wnl no skin abnormalities noted.  Pt assisted back into bed with assistance of 6 staff members with use of sheet for lifting. Ice applied to pt back per pt request.

## 2024-06-18 ENCOUNTER — Other Ambulatory Visit: Payer: Self-pay

## 2024-06-18 DIAGNOSIS — U071 COVID-19: Secondary | ICD-10-CM | POA: Diagnosis not present

## 2024-06-18 DIAGNOSIS — E871 Hypo-osmolality and hyponatremia: Secondary | ICD-10-CM | POA: Diagnosis not present

## 2024-06-18 DIAGNOSIS — N179 Acute kidney failure, unspecified: Secondary | ICD-10-CM | POA: Diagnosis not present

## 2024-06-18 DIAGNOSIS — R55 Syncope and collapse: Secondary | ICD-10-CM

## 2024-06-18 DIAGNOSIS — F32A Depression, unspecified: Secondary | ICD-10-CM | POA: Diagnosis not present

## 2024-06-18 DIAGNOSIS — Z91040 Latex allergy status: Secondary | ICD-10-CM | POA: Diagnosis not present

## 2024-06-18 DIAGNOSIS — R531 Weakness: Secondary | ICD-10-CM | POA: Diagnosis not present

## 2024-06-18 DIAGNOSIS — Z79899 Other long term (current) drug therapy: Secondary | ICD-10-CM | POA: Diagnosis not present

## 2024-06-18 DIAGNOSIS — I10 Essential (primary) hypertension: Secondary | ICD-10-CM | POA: Diagnosis not present

## 2024-06-18 DIAGNOSIS — I82409 Acute embolism and thrombosis of unspecified deep veins of unspecified lower extremity: Secondary | ICD-10-CM | POA: Diagnosis not present

## 2024-06-18 DIAGNOSIS — E860 Dehydration: Secondary | ICD-10-CM | POA: Diagnosis not present

## 2024-06-18 DIAGNOSIS — Z1152 Encounter for screening for COVID-19: Secondary | ICD-10-CM | POA: Diagnosis not present

## 2024-06-18 DIAGNOSIS — E876 Hypokalemia: Secondary | ICD-10-CM | POA: Diagnosis not present

## 2024-06-18 LAB — BASIC METABOLIC PANEL WITH GFR
Anion gap: 3 — ABNORMAL LOW (ref 5–15)
BUN: 7 mg/dL — ABNORMAL LOW (ref 8–23)
CO2: 25 mmol/L (ref 22–32)
Calcium: 8.1 mg/dL — ABNORMAL LOW (ref 8.9–10.3)
Chloride: 106 mmol/L (ref 98–111)
Creatinine, Ser: 1.22 mg/dL — ABNORMAL HIGH (ref 0.44–1.00)
GFR, Estimated: 50 mL/min — ABNORMAL LOW (ref >60)
Glucose, Bld: 131 mg/dL — ABNORMAL HIGH (ref 70–99)
Potassium: 3.4 mmol/L — ABNORMAL LOW (ref 3.5–5.1)
Sodium: 134 mmol/L — ABNORMAL LOW (ref 135–145)

## 2024-06-18 LAB — HIV ANTIBODY (ROUTINE TESTING W REFLEX): HIV Screen 4th Generation wRfx: NONREACTIVE

## 2024-06-18 MED ORDER — KETOROLAC TROMETHAMINE 15 MG/ML IJ SOLN
15.0000 mg | Freq: Once | INTRAMUSCULAR | Status: AC
  Administered 2024-06-18: 15 mg via INTRAVENOUS
  Filled 2024-06-18: qty 1

## 2024-06-18 MED ORDER — METHOCARBAMOL 500 MG PO TABS
500.0000 mg | ORAL_TABLET | Freq: Four times a day (QID) | ORAL | Status: DC | PRN
  Administered 2024-06-19: 500 mg via ORAL
  Filled 2024-06-18 (×2): qty 1

## 2024-06-18 MED ORDER — POTASSIUM CHLORIDE CRYS ER 20 MEQ PO TBCR
40.0000 meq | EXTENDED_RELEASE_TABLET | Freq: Once | ORAL | Status: AC
  Administered 2024-06-18: 40 meq via ORAL
  Filled 2024-06-18: qty 2

## 2024-06-18 MED ORDER — SODIUM CHLORIDE 0.9 % IV SOLN: INTRAVENOUS | Status: DC

## 2024-06-18 MED ORDER — GUAIFENESIN ER 600 MG PO TB12
1200.0000 mg | ORAL_TABLET | Freq: Two times a day (BID) | ORAL | Status: DC
  Administered 2024-06-18 – 2024-06-19 (×2): 1200 mg via ORAL
  Filled 2024-06-18 (×2): qty 2

## 2024-06-18 MED ORDER — KETOROLAC TROMETHAMINE 15 MG/ML IJ SOLN: 15.0000 mg | Freq: Four times a day (QID) | INTRAMUSCULAR | Status: DC | PRN

## 2024-06-18 MED ORDER — METHYLPREDNISOLONE SODIUM SUCC 40 MG IJ SOLR
40.0000 mg | Freq: Every day | INTRAMUSCULAR | Status: DC
  Administered 2024-06-18 – 2024-06-19 (×2): 40 mg via INTRAVENOUS
  Filled 2024-06-18 (×2): qty 1

## 2024-06-18 NOTE — Evaluation (Signed)
 Occupational Therapy Evaluation Patient Details Name: [name] MRN: [id number] DOB: 06/04/1961 Today's Date: 06/18/2024   History of Present Illness   [name] is a 63 y.o. female with a history of HTN, HLD, DDD s/p lumbar fusion earlier this year, and right foot drop who presented to the ED with weakness and fainting spells in the setting of several days of URI symptoms. Pt found to be positive for COVID PCR. Chest x-ray negative for pna. While pt was at [facility] she has an assisted fall with staff when attempting to ambulate. X-ray negative for any acute injury, pt's hardware intact from fusion.  PMHX includes HLD, HTN, GERD, LVH, syncope, R foot drop     Clinical Impressions Pt reports PTA, she was independent with ADL/IADL and functional mobility. Pt currently limited to sitting EOB secondary to c/o dizziness, instability sitting EOB and pt request to lay down. Pt reporting she does not feel safe enough to attempt to stand at this time. Pt noted to have slight tremor in RUE with prolonged sitting EOB. BP below. Pt required minA for bed mobility and safety/stability sitting EOB. Will continue to follow acutely and progress appropriately.   Supine: 153/2mmHg HR 82bpm SpO2 100% RA  Sitting EOB: 143/36mmHg HR 84bpm SpO2 100%  RA     If plan is discharge home, recommend the following:   A little help with walking and/or transfers;A little help with bathing/dressing/bathroom;Assistance with feeding;Assist for transportation     Functional Status Assessment   Patient has had a recent decline in their functional status and demonstrates the ability to make significant improvements in function in a reasonable and predictable amount of time.     Equipment Recommendations   Other (comment) (tbd)     Recommendations for Other Services         Precautions/Restrictions   Precautions Precautions: Fall Restrictions Weight Bearing  Restrictions Per Provider Order: No     Mobility Bed Mobility Overal bed mobility: Needs Assistance Bed Mobility: Rolling, Sit to Sidelying, Sidelying to Sit Rolling: Independent Sidelying to sit: Min assist     Sit to sidelying: Min assist General bed mobility comments: assist for trunk to progression to sitting, assist for BLE with return to supine, vc for reminder to log roll to protect her back    Transfers                   General transfer comment: unable to safely assess      Balance Overall balance assessment: Needs assistance Sitting-balance support: Single extremity supported, Feet supported Sitting balance-Leahy Scale: Fair Sitting balance - Comments: fair balance, but reports of dizziness and therapist provided minA for safety                                   ADL either performed or assessed with clinical judgement   ADL Overall ADL's : Needs assistance/impaired Eating/Feeding: Set up;Sitting   Grooming: Set up;Sitting   Upper Body Bathing: Set up;Sitting   Lower Body Bathing: Minimal assistance;Sitting/lateral leans   Upper Body Dressing : Set up;Sitting   Lower Body Dressing: Minimal assistance;Sitting/lateral leans Lower Body Dressing Details (indicate cue type and reason): assist to access feet               General ADL Comments: pt limited to sitting EOB secondary to c/o dizziness and requesting to lay back down. pt reports she does not  feel safe to attempt to stand     Vision         Perception         Praxis         Pertinent Vitals/Pain Pain Assessment Pain Assessment: Faces Faces Pain Scale: Hurts little more Pain Location: back, RN aware Pain Descriptors / Indicators: Sore Pain Intervention(s): Limited activity within patient's tolerance, Monitored during session     Extremity/Trunk Assessment Upper Extremity Assessment Upper Extremity Assessment: Overall WFL for tasks assessed   Lower Extremity  Assessment Lower Extremity Assessment: Defer to PT evaluation   Cervical / Trunk Assessment Cervical / Trunk Assessment: Normal   Communication Communication Communication: No apparent difficulties   Cognition Arousal: Alert Behavior During Therapy: WFL for tasks assessed/performed Cognition: No apparent impairments                               Following commands: Intact       Cueing  General Comments          Exercises     Shoulder Instructions      Home Living Family/patient expects to be discharged to:: Private residence Living Arrangements: Spouse/significant other Available Help at Discharge: Family;Available PRN/intermittently Type of Home: House Home Access: Stairs to enter [employer] of Steps: 4 Entrance Stairs-Rails: Right Home Layout: Two level;Able to live on main level with bedroom/bathroom     Bathroom Shower/Tub: Producer, television/film/video: Handicapped height Bathroom Accessibility: Yes How Accessible: Accessible via walker Home Equipment: Rolling Walker (2 wheels);Rollator (4 wheels);Cane - quad;Cane - single point;BSC/3in1;Shower seat;Wheelchair - manual          Prior Functioning/Environment Prior Level of Function : Independent/Modified Independent             Mobility Comments: no AD ADLs Comments: independent wtih ADL    OT Problem List: Decreased activity tolerance;Impaired balance (sitting and/or standing);Cardiopulmonary status limiting activity;Pain   OT Treatment/Interventions: Self-care/ADL training;Energy conservation;DME and/or AE instruction;Therapeutic activities;Patient/family education;Balance training      OT Goals(Current goals can be found in the care plan section)   Acute Rehab OT Goals Patient Stated Goal: to get better and go home OT Goal Formulation: With patient Time For Goal Achievement: 07/02/24 Potential to Achieve Goals: Good ADL Goals Pt Will Perform Grooming: with  modified independence;standing Pt Will Perform Lower Body Dressing: with modified independence;sit to/from stand Pt Will Transfer to Toilet: with modified independence;ambulating   OT Frequency:  Min 2X/week    Co-evaluation              AM-PAC OT 6 Clicks Daily Activity     Outcome Measure Help from another person eating meals?: None Help from another person taking care of personal grooming?: A Little Help from another person toileting, which includes using toliet, bedpan, or urinal?: A Lot Help from another person bathing (including washing, rinsing, drying)?: A Lot Help from another person to put on and taking off regular upper body clothing?: A Little Help from another person to put on and taking off regular lower body clothing?: A Lot 6 Click Score: 16   End of Session Nurse Communication: Mobility status  Activity Tolerance: Other (comment) (Pt limited by c/o dizziness and requesting to lay back down) Patient left: in bed;with call bell/phone within reach;with bed alarm set  OT Visit Diagnosis: Other abnormalities of gait and mobility (R26.89);Pain Pain - Right/Left:  (back)  Time: 8966-8889 OT Time Calculation (min): 37 min Charges:  OT General Charges $OT Visit: 1 Visit OT Evaluation $OT Eval Moderate Complexity: 1 Mod OT Treatments $Self Care/Home Management : 8-22 mins  Verneita OTR/L Acute Rehabilitation Services Office: [phone]   Verneita [name] 06/18/2024, 12:34 PM

## 2024-06-18 NOTE — Progress Notes (Signed)
 PROGRESS NOTE    Kali Deadwyler  FMW:[id number] DOB: 1961-07-07 DOA: 06/17/2024 PCP: [place], [name], MD   Brief Narrative:  63 y.o. female with a history of HTN, HLD, DDD s/p lumbar fusion earlier this year, and right foot drop presented with cough, weakness and fainting spells.  She took a course of prednisone  prescribed by her PCP's office which she has already completed but did not feel much better.  On presentation, she was found to be positive for COVID PCR.  Chest x-ray showed no pneumonia.  Creatinine was 1.42.  She was afebrile without hypoxemia.  While trying to ambulate, she fell with her bottom on the floor, slowed by RN who attempted to catch her.  Subsequently, lumbar CT showed no acute findings.  Assessment & Plan:   COVID-19 infection -Continues to feel poorly.  No signs of pneumonia on chest x-ray but patient has continued dry cough.  Continue as needed Tussionex. - Will start Solu-Medrol  40 mg daily. - Continue isolation.  Continue supportive care  Hypokalemia--replace.  Repeat a.m. labs  Hyponatremia - Mild.  Monitor.  Encourage oral intake  Fall zeb - Due to viral illness along with chronic spinal disease and chronic right foot drop.  PT/OT eval  AKI Dehydration - Creatinine 1.42 on presentation from prior of 1.1.  Improving to 1.22.  Treated with IV fluids.  Oral intake still poor.  Will resume IV fluids  Syncope -Most likely vasovagal.  Happened while vomiting and having bowel movement.  No syncope since admission.  Telemetry unremarkable.  Last echo showed preserved LVEF and mild basal septal hypertrophy with normal diastolic parameters. -Monitor orthostatics -Fall precautions  Hypertension - Blood pressure intermittently on the lower side.  Continue Coreg .  Hold Norvasc  and hydralazine   DVT - Continue Nucynta .  Outpatient follow-up with PCP and/or pain management.  Continue as needed muscle relaxant  Depression -Continue amitriptyline   and duloxetine    DVT prophylaxis: Lovenox  Code Status: Full Family Communication: Husband at bedside Disposition Plan: Status is: Observation The patient will require care spanning > 2 midnights and should be moved to inpatient because: Of severity of illness    Consultants: None  Procedures: None  Antimicrobials: None   Subjective: Patient seen and examined at bedside.  Continues to feel weak and does not feel ready to go home.  Continues to have dry cough.  Complains of lower back pain.  No fever reported.  Objective: Vitals:   06/18/24 0003 06/18/24 0159 06/18/24 0451 06/18/24 0800  BP: 120/81 105/79 138/74 (!) 127/93  Pulse: 77 86 79 75  Resp:   20   Temp: 97.9 F (36.6 C) 97.9 F (36.6 C) 97.7 F (36.5 C) 98 F (36.7 C)  TempSrc: Oral Oral Oral Oral  SpO2: 100%  100% 100%  Weight:      Height:        Intake/Output Summary (Last 24 hours) at 06/18/2024 0954 Last data filed at 06/18/2024 0500 Gross per 24 hour  Intake 720 ml  Output --  Net 720 ml   Filed Weights   06/17/24 0550  Weight: 74.4 kg    Examination:  General exam: Appears calm and comfortable  Respiratory system: Bilateral decreased breath sounds at bases with scattered crackles Cardiovascular system: S1 & S2 heard, Rate controlled Gastrointestinal system: Abdomen is nondistended, soft and nontender. Normal bowel sounds heard. Extremities: No cyanosis, clubbing, edema  Central nervous system: Alert and oriented. No focal neurological deficits. Moving extremities Skin: No rashes, lesions or ulcers  Psychiatry: Judgement and insight appear normal. Mood & affect appropriate.     Data Reviewed: I have personally reviewed following labs and imaging studies  CBC: Recent Labs  Lab 06/17/24 0614  WBC 8.7  NEUTROABS 6.2  HGB 12.8  HCT 38.8  MCV 90.2  PLT 241   Basic Metabolic Panel: Recent Labs  Lab 06/17/24 0614 06/18/24 0557  NA 137 134*  K 3.9 3.4*  CL 101 106  CO2 22 25   GLUCOSE 112* 131*  BUN 11 7*  CREATININE 1.42* 1.22*  CALCIUM  9.5 8.1*   GFR: Estimated Creatinine Clearance: 48.3 mL/min (A) (by C-G formula based on SCr of 1.22 mg/dL (H)). Liver Function Tests: No results for input(s): AST, ALT, ALKPHOS, BILITOT, PROT, ALBUMIN in the last 168 hours. No results for input(s): LIPASE, AMYLASE in the last 168 hours. No results for input(s): AMMONIA in the last 168 hours. Coagulation Profile: No results for input(s): INR, PROTIME in the last 168 hours. Cardiac Enzymes: No results for input(s): CKTOTAL, CKMB, CKMBINDEX, TROPONINI in the last 168 hours. BNP (last 3 results) No results for input(s): PROBNP in the last 8760 hours. HbA1C: No results for input(s): HGBA1C in the last 72 hours. CBG: No results for input(s): GLUCAP in the last 168 hours. Lipid Profile: No results for input(s): CHOL, HDL, LDLCALC, TRIG, CHOLHDL, LDLDIRECT in the last 72 hours. Thyroid  Function Tests: No results for input(s): TSH, T4TOTAL, FREET4, T3FREE, THYROIDAB in the last 72 hours. Anemia Panel: No results for input(s): VITAMINB12, FOLATE, FERRITIN, TIBC, IRON, RETICCTPCT in the last 72 hours. Sepsis Labs: No results for input(s): PROCALCITON, LATICACIDVEN in the last 168 hours.  Recent Results (from the past 240 hours)  Resp panel by RT-PCR (RSV, Flu A&B, Covid) Anterior Nasal Swab     Status: Abnormal   Collection Time: 06/17/24  6:14 AM   Specimen: Anterior Nasal Swab  Result Value Ref Range Status   SARS Coronavirus 2 by RT PCR POSITIVE (A) NEGATIVE Final    Comment: (NOTE) SARS-CoV-2 target nucleic acids are DETECTED.  The SARS-CoV-2 RNA is generally detectable in upper respiratory specimens during the acute phase of infection. Positive results are indicative of the presence of the identified virus, but do not rule out bacterial infection or co-infection with other pathogens  not detected by the test. Clinical correlation with patient history and other diagnostic information is necessary to determine patient infection status. The expected result is Negative.  Fact Sheet for Patients: BloggerCourse.com  Fact Sheet for Healthcare Providers: SeriousBroker.it  This test is not yet approved or cleared by the [place]  FDA and  has been authorized for detection and/or diagnosis of SARS-CoV-2 by FDA under an Emergency Use Authorization (EUA).  This EUA will remain in effect (meaning this test can be used) for the duration of  the COVID-19 declaration under Section 564(b)(1) of the A ct, 21 U.S.C. section 360bbb-3(b)(1), unless the authorization is terminated or revoked sooner.     Influenza A by PCR NEGATIVE NEGATIVE Final   Influenza B by PCR NEGATIVE NEGATIVE Final    Comment: (NOTE) The Xpert Xpress SARS-CoV-2/FLU/RSV plus assay is intended as an aid in the diagnosis of influenza from Nasopharyngeal swab specimens and should not be used as a sole basis for treatment. Nasal washings and aspirates are unacceptable for Xpert Xpress SARS-CoV-2/FLU/RSV testing.  Fact Sheet for Patients: BloggerCourse.com  Fact Sheet for Healthcare Providers: SeriousBroker.it  This test is not yet approved or cleared by the [place]  FDA and  has been authorized for detection and/or diagnosis of SARS-CoV-2 by FDA under an Emergency Use Authorization (EUA). This EUA will remain in effect (meaning this test can be used) for the duration of the COVID-19 declaration under Section 564(b)(1) of the Act, 21 U.S.C. section 360bbb-3(b)(1), unless the authorization is terminated or revoked.     Resp Syncytial Virus by PCR NEGATIVE NEGATIVE Final    Comment: (NOTE) Fact Sheet for Patients: BloggerCourse.com  Fact Sheet for Healthcare  Providers: SeriousBroker.it  This test is not yet approved or cleared by the [place]  FDA and has been authorized for detection and/or diagnosis of SARS-CoV-2 by FDA under an Emergency Use Authorization (EUA). This EUA will remain in effect (meaning this test can be used) for the duration of the COVID-19 declaration under Section 564(b)(1) of the Act, 21 U.S.C. section 360bbb-3(b)(1), unless the authorization is terminated or revoked.  Performed at [facility], [address], [place], [place] [address]          Radiology Studies: CT Lumbar Spine Wo Contrast Result Date: 06/17/2024 CLINICAL DATA:  62 year old female with cough, syncope, headache, weakness. EXAM: CT LUMBAR SPINE WITHOUT CONTRAST TECHNIQUE: Multidetector CT imaging of the lumbar spine was performed without intravenous contrast administration. Multiplanar CT image reconstructions were also generated. RADIATION DOSE REDUCTION: This exam was performed according to the departmental dose-optimization program which includes automated exposure control, adjustment of the mA and/or kV according to patient size and/or use of iterative reconstruction technique. COMPARISON:  Portable chest x-ray today. Lumbar spine CT 07/12/2018 and lumbar MRI 01/12/2024. FINDINGS: Segmentation: Normal, the same numbering system used on MRI this year. Alignment: Mild dextroconvex lumbar scoliosis appears increased since 2019. Stable vertebral height and alignment since March, mild degenerative retrolisthesis of L2 on L3 (sagittal image 30). Vertebrae: Mild motion artifact. Chronic postoperative changes at L4 and L5 are detailed below. Stable vertebral height. Chronic L2 superior endplate Schmorl's node. No acute osseous abnormality identified. Intact visible sacrum and SI joints. Paraspinal and other soft tissues: Aortoiliac calcified atherosclerosis. Normal caliber abdominal aorta. Otherwise negative  visible noncontrast abdominal viscera. Postoperative changes to the lower lumbar paraspinal soft tissues with no adverse features identified. Disc levels: By CT visible lower thoracic through L2-L3 spine degeneration and multifactorial spinal stenosis appears stable from the March MRI, moderate at L2-L3, mild-to-moderate at L3-L4. L4-L5 pedicle screws and decompression. No hardware loosening. Posterior bone graft material. No convincing solid arthrodesis at this time. L5-S1 level appears stable and negative. IMPRESSION: 1. No acute osseous abnormality in the Lumbar Spine. 2. L4-L5 decompression and fusion with no adverse hardware features. And lumbar spine degeneration elsewhere appears stable from march MRI, with multifactorial lumbar spinal stenosis at L2-L3 and L3-L4. 3.  Aortic Atherosclerosis (ICD10-I70.0). Electronically Signed   By: VEAR Hurst M.D.   On: 06/17/2024 11:43   DG Chest Portable 1 View Result Date: 06/17/2024 CLINICAL DATA:  63 year old female with cough, syncope, headache, weakness. EXAM: PORTABLE CHEST 1 VIEW COMPARISON:  Chest radiographs 06/03/2024 and earlier. FINDINGS: Portable AP view at 0644 hours. Lung volumes and mediastinal contours are stable and within normal limits. Visualized tracheal air column is within normal limits. Allowing for portable technique the lungs are clear. No pneumothorax or pleural effusion. Negative visible bowel gas and osseous structures. IMPRESSION: Negative portable chest. Electronically Signed   By: VEAR Hurst M.D.   On: 06/17/2024 07:12   CT Head Wo Contrast Result Date: 06/17/2024 CLINICAL DATA:  63 year old female with cough, syncope, headache, weakness. EXAM: CT  HEAD WITHOUT CONTRAST TECHNIQUE: Contiguous axial images were obtained from the base of the skull through the vertex without intravenous contrast. RADIATION DOSE REDUCTION: This exam was performed according to the departmental dose-optimization program which includes automated exposure control,  adjustment of the mA and/or kV according to patient size and/or use of iterative reconstruction technique. COMPARISON:  Head CT 05/13/2021. FINDINGS: Brain: Stable and normal cerebral volume for age. No midline shift, ventriculomegaly, mass effect, evidence of mass lesion, intracranial hemorrhage or evidence of cortically based acute infarction. Gray-white matter differentiation is within normal limits throughout the brain. Vascular: No suspicious intracranial vascular hyperdensity. Mild Calcified atherosclerosis at the skull base. Skull: Intact.  No acute osseous abnormality identified. Sinuses/Orbits: Mild bilateral paranasal sinus mucosal thickening appears new or increased. Tympanic cavities and mastoids remain well aerated. Other: Visualized orbits and scalp soft tissues are within normal limits. IMPRESSION: 1. Stable and normal for age noncontrast CT appearance of the brain. 2. Minimal to mild new bilateral paranasal sinus inflammation. Electronically Signed   By: VEAR Hurst M.D.   On: 06/17/2024 07:11        Scheduled Meds:  acetaminophen   650 mg Oral Q6H   amitriptyline   25 mg Oral Daily   atorvastatin   20 mg Oral Daily   benzonatate   100 mg Oral TID   carvedilol   25 mg Oral BID WC   DULoxetine   30 mg Oral Daily   enoxaparin  (LOVENOX ) injection  40 mg Subcutaneous Q24H   gabapentin   300 mg Oral TID   guaiFENesin   600 mg Oral BID   hydrALAZINE   50 mg Oral BID   potassium chloride   40 mEq Oral Once   sodium chloride  flush  3 mL Intravenous Q12H   Continuous Infusions:        Sophie Mao, MD [facility] 06/18/2024, 9:54 AM

## 2024-06-18 NOTE — Plan of Care (Signed)
  Problem: Education: Goal: Knowledge of risk factors and measures for prevention of condition will improve Outcome: Progressing   Problem: Coping: Goal: Psychosocial and spiritual needs will be supported Outcome: Progressing   Problem: Respiratory: Goal: Will maintain a patent airway Outcome: Progressing Goal: Complications related to the disease process, condition or treatment will be avoided or minimized Outcome: Progressing   Problem: Education: Goal: Knowledge of General Education information will improve Description: Including pain rating scale, medication(s)/side effects and non-pharmacologic comfort measures Outcome: Progressing   Problem: Health Behavior/Discharge Planning: Goal: Ability to manage health-related needs will improve Outcome: Progressing   Problem: Clinical Measurements: Goal: Ability to maintain clinical measurements within normal limits will improve Outcome: Progressing Goal: Will remain free from infection Outcome: Progressing Goal: Diagnostic test results will improve Outcome: Progressing Goal: Respiratory complications will improve Outcome: Progressing Goal: Cardiovascular complication will be avoided Outcome: Progressing   Problem: Activity: Goal: Risk for activity intolerance will decrease Outcome: Progressing   Problem: Nutrition: Goal: Adequate nutrition will be maintained Outcome: Progressing   Problem: Coping: Goal: Level of anxiety will decrease Outcome: Progressing   Problem: Elimination: Goal: Will not experience complications related to bowel motility Outcome: Progressing Goal: Will not experience complications related to urinary retention Outcome: Progressing   Problem: Pain Managment: Goal: General experience of comfort will improve and/or be controlled Outcome: Progressing   Problem: Safety: Goal: Ability to remain free from injury will improve Outcome: Progressing   Problem: Skin Integrity: Goal: Risk for impaired  skin integrity will decrease Outcome: Progressing

## 2024-06-18 NOTE — Progress Notes (Signed)
Unit staff able to establish PIV

## 2024-06-18 NOTE — Evaluation (Addendum)
 Physical Therapy Evaluation Patient Details Name: [name] MRN: [id number] DOB: 02-18-1961 Today's Date: 06/18/2024  History of Present Illness  [name] is a 63 y.o. female admitted from [facility] with symptom consistent with COVID, weakness and feeling of dizziness and feeling faint. PMHx:  HTN, HLD, DDD s/p lumbar fusion earlier this year, and right foot drop. While pt was at [facility] she has an assisted fall with staff when attempting to ambulate. X-ray negative for any acute injury, pt's hardware intact from fusion.  Clinical Impression  Pt admitted with/for dizziness and weakness stemming from COVID verified on admission.  Pt continues to feel poorly needing CGA to min assist for mobility.  Pt currently limited functionally due to the problems listed below.  (see problems list.)  Pt will benefit from PT to maximize function and safety to be able to get home safely with available assist.         If plan is discharge home, recommend the following: A little help with walking and/or transfers;A little help with bathing/dressing/bathroom;A lot of help with bathing/dressing/bathroom   Can travel by private vehicle        Equipment Recommendations None recommended by PT (TBD)  Recommendations for Other Services       Functional Status Assessment Patient has had a recent decline in their functional status and demonstrates the ability to make significant improvements in function in a reasonable and predictable amount of time.     Precautions / Restrictions Precautions Precautions: Fall Restrictions Weight Bearing Restrictions Per Provider Order: No      Mobility  Bed Mobility Overal bed mobility: Needs Assistance   Rolling: Independent Sidelying to sit: Min assist     Sit to sidelying: Min assist General bed mobility comments: sitting EOB on arrival trying to eat    Transfers Overall transfer level: Needs assistance   Transfers: Sit  to/from Stand, Bed to chair/wheelchair/BSC Sit to Stand: Contact guard assist Stand pivot transfers: Contact guard assist         General transfer comment: pt used UE's appropriately for safety during transfers    Ambulation/Gait Ambulation/Gait assistance: Contact guard assist, Min assist Gait Distance (Feet): 10 Feet Assistive device: Rolling walker (2 wheels) Gait Pattern/deviations: Step-through pattern   Gait velocity interpretation: <1.31 ft/sec, indicative of household ambulator   General Gait Details: R supported LE with poor heel toe pattern, wide BOS  Stairs            Wheelchair Mobility     Tilt Bed    Modified Rankin (Stroke Patients Only)       Balance Overall balance assessment: Needs assistance Sitting-balance support: Single extremity supported, Feet supported Sitting balance-Leahy Scale: Fair Sitting balance - Comments: fair balance, but reports of dizziness and therapist provided minA for safety     Standing balance-Leahy Scale: Fair Standing balance comment: CG only, but could pivot without holding to a stationary surface.                             Pertinent Vitals/Pain Pain Assessment Pain Assessment: Faces Faces Pain Scale: Hurts little more Pain Location: back Pain Descriptors / Indicators: Sore (RN aware) Pain Intervention(s): Monitored during session, Repositioned    Home Living Family/patient expects to be discharged to:: Private residence Living Arrangements: Spouse/significant other Available Help at Discharge: Family;Available PRN/intermittently Type of Home: House Home Access: Stairs to enter Entrance Stairs-Rails: Right Entrance Stairs-Number of Steps: 4  Home Layout: Two level;Able to live on main level with bedroom/bathroom Home Equipment: Rolling Walker (2 wheels);Rollator (4 wheels);Cane - quad;Cane - single point;BSC/3in1;Shower seat;Wheelchair - manual      Prior Function Prior Level of Function :  Independent/Modified Independent             Mobility Comments: no AD ADLs Comments: independent wtih ADL     Extremity/Trunk Assessment   Upper Extremity Assessment Upper Extremity Assessment: Overall WFL for tasks assessed    Lower Extremity Assessment Lower Extremity Assessment: Overall WFL for tasks assessed;Generalized weakness    Cervical / Trunk Assessment Cervical / Trunk Assessment: Normal  Communication   Communication Communication: No apparent difficulties    Cognition Arousal: Alert, Lethargic Behavior During Therapy: WFL for tasks assessed/performed                             Following commands: Intact       Cueing Cueing Techniques: Verbal cues, Gestural cues     General Comments General comments (skin integrity, edema, etc.): VSS, Sats at 100% on RA    Exercises     Assessment/Plan    PT Assessment Patient needs continued PT services  PT Problem List Decreased strength;Decreased activity tolerance;Decreased balance;Decreased mobility;Decreased knowledge of use of DME;Decreased knowledge of precautions       PT Treatment Interventions Gait training;Stair training;Functional mobility training;Therapeutic activities;Balance training;Patient/family education    PT Goals (Current goals can be found in the Care Plan section)  Acute Rehab PT Goals Patient Stated Goal: Indepedent and free of COVID Time For Goal Achievement: 07/02/24 Potential to Achieve Goals: Good    Frequency Min 2X/week     Co-evaluation               AM-PAC PT 6 Clicks Mobility  Outcome Measure Help needed turning from your back to your side while in a flat bed without using bedrails?: None Help needed moving from lying on your back to sitting on the side of a flat bed without using bedrails?: None Help needed moving to and from a bed to a chair (including a wheelchair)?: A Little Help needed standing up from a chair using your arms (e.g.,  wheelchair or bedside chair)?: A Little Help needed to walk in hospital room?: A Little Help needed climbing 3-5 steps with a railing? : A Little 6 Click Score: 20    End of Session   Activity Tolerance: Patient tolerated treatment well Patient left: in bed;with call bell/phone within reach;with bed alarm set Nurse Communication: Mobility status PT Visit Diagnosis: Other abnormalities of gait and mobility (R26.89);Unsteadiness on feet (R26.81);Muscle weakness (generalized) (M62.81)    Time: 8851-8779 PT Time Calculation (min) (ACUTE ONLY): 32 min   Charges:   PT Evaluation $PT Eval Moderate Complexity: 1 Mod PT Treatments $Gait Training: 8-22 mins PT General Charges $$ ACUTE PT VISIT: 1 Visit         06/18/2024  India HERO., PT Acute Rehabilitation Services [phone]  (office)  Vinie GAILS Hitomi Slape 06/18/2024, 2:22 PM

## 2024-06-18 NOTE — Care Management Obs Status (Signed)
 MEDICARE OBSERVATION STATUS NOTIFICATION   Patient Details  Name: [name] MRN: [id number] Date of Birth: 10-24-1961   Medicare Observation Status Notification Given:  Yes    Jermery Caratachea G., RN 06/18/2024, 9:34 AM

## 2024-06-19 DIAGNOSIS — U071 COVID-19: Secondary | ICD-10-CM | POA: Diagnosis not present

## 2024-06-19 DIAGNOSIS — R55 Syncope and collapse: Secondary | ICD-10-CM | POA: Diagnosis not present

## 2024-06-19 LAB — COMPREHENSIVE METABOLIC PANEL WITH GFR
ALT: 19 U/L (ref 0–44)
AST: 20 U/L (ref 15–41)
Albumin: 2.6 g/dL — ABNORMAL LOW (ref 3.5–5.0)
Alkaline Phosphatase: 52 U/L (ref 38–126)
Anion gap: 6 (ref 5–15)
BUN: 12 mg/dL (ref 8–23)
CO2: 22 mmol/L (ref 22–32)
Calcium: 8.6 mg/dL — ABNORMAL LOW (ref 8.9–10.3)
Chloride: 108 mmol/L (ref 98–111)
Creatinine, Ser: 1.27 mg/dL — ABNORMAL HIGH (ref 0.44–1.00)
GFR, Estimated: 48 mL/min — ABNORMAL LOW (ref >60)
Glucose, Bld: 134 mg/dL — ABNORMAL HIGH (ref 70–99)
Potassium: 5 mmol/L (ref 3.5–5.1)
Sodium: 136 mmol/L (ref 135–145)
Total Bilirubin: 0.4 mg/dL (ref 0.0–1.2)
Total Protein: 6 g/dL — ABNORMAL LOW (ref 6.5–8.1)

## 2024-06-19 LAB — C-REACTIVE PROTEIN: CRP: 6.5 mg/dL — ABNORMAL HIGH (ref <1.0)

## 2024-06-19 LAB — MAGNESIUM: Magnesium: 1.8 mg/dL (ref 1.7–2.4)

## 2024-06-19 MED ORDER — PREDNISONE 20 MG PO TABS
40.0000 mg | ORAL_TABLET | Freq: Every day | ORAL | 0 refills | Status: AC
  Filled 2024-06-19: qty 14, 7d supply, fill #0

## 2024-06-19 MED ORDER — DULOXETINE HCL 30 MG PO CPEP: 30.0000 mg | ORAL_CAPSULE | Freq: Every day | ORAL | Status: AC

## 2024-06-19 MED ORDER — GUAIFENESIN ER 600 MG PO TB12
1200.0000 mg | ORAL_TABLET | Freq: Two times a day (BID) | ORAL | 0 refills | Status: DC | PRN
  Filled 2024-06-19: qty 20, 5d supply, fill #0

## 2024-06-19 MED ORDER — MECLIZINE HCL 25 MG PO TABS: 25.0000 mg | ORAL_TABLET | Freq: Three times a day (TID) | ORAL | Status: DC | PRN

## 2024-06-19 MED ORDER — HYDROCOD POLI-CHLORPHE POLI ER 10-8 MG/5ML PO SUER
5.0000 mL | Freq: Two times a day (BID) | ORAL | 0 refills | Status: DC | PRN
  Filled 2024-06-19: qty 70, 7d supply, fill #0

## 2024-06-19 MED ORDER — MECLIZINE HCL 25 MG PO TABS
25.0000 mg | ORAL_TABLET | Freq: Three times a day (TID) | ORAL | 0 refills | Status: AC | PRN
  Filled 2024-06-19: qty 30, 10d supply, fill #0

## 2024-06-19 MED ORDER — BENZONATATE 100 MG PO CAPS
100.0000 mg | ORAL_CAPSULE | Freq: Three times a day (TID) | ORAL | 0 refills | Status: DC
  Filled 2024-06-19: qty 20, 7d supply, fill #0

## 2024-06-19 MED ORDER — GABAPENTIN 600 MG PO TABS: 300.0000 mg | ORAL_TABLET | Freq: Three times a day (TID) | ORAL | Status: AC

## 2024-06-19 MED ORDER — HYDRALAZINE HCL 50 MG PO TABS: 50.0000 mg | ORAL_TABLET | Freq: Two times a day (BID) | ORAL | Status: DC

## 2024-06-19 NOTE — Plan of Care (Signed)
   Problem: Education: Goal: Knowledge of risk factors and measures for prevention of condition will improve Outcome: Progressing   Problem: Coping: Goal: Psychosocial and spiritual needs will be supported Outcome: Progressing   Problem: Respiratory: Goal: Will maintain a patent airway Outcome: Progressing

## 2024-06-19 NOTE — Discharge Summary (Signed)
 Physician Discharge Summary  [name] FMW:[id number] DOB: [birth year] DOA: 06/17/2024  PCP: [place], [name], MD  Admit date: 06/17/2024 Discharge date: 06/19/2024  Admitted From: Home Disposition: Home  Recommendations for Outpatient Follow-up:  Follow up with PCP in 1 week with repeat CBC/BMP Continue and complete isolation at home Follow up in ED if symptoms worsen or new appear   Home Health: PT/OT recommended home health PT/OT: Patient refused it.  Wants to follow-up with outpatient PT. Equipment/Devices: None  Discharge Condition: Stable CODE STATUS: Full Diet recommendation: Heart healthy  Brief/Interim Summary: 63 y.o. female with a history of HTN, HLD, DDD s/p lumbar fusion earlier this year, and right foot drop presented with cough, weakness and fainting spells.  She took a course of prednisone  prescribed by her PCP's office which she has already completed but did not feel much better.  On presentation, she was found to be positive for COVID PCR.  Chest x-ray showed no pneumonia.  Creatinine was 1.42.  She was afebrile without hypoxemia.  While trying to ambulate, she fell with her bottom on the floor, slowed by RN who attempted to catch her.  Subsequently, lumbar CT showed no acute findings.  She was treated with IV fluids and supportive care.  Subsequently, her condition has improved.  She still feels weak but feels okay to go home today.  She will be discharged home today with outpatient follow-up with PCP.  Discharge Diagnoses:   COVID-19 infection -No signs of pneumonia on chest x-ray but patient has continued dry cough.  Continue as needed Tussionex. - Currently on IV Solu-Medrol  40 mg daily. - She was treated with IV fluids and supportive care.  Subsequently, her condition has improved.  She still feels weak but feels okay to go home today.  She will be discharged home today with outpatient follow-up with PCP.  Continue prednisone  40 daily for 7 days on  discharge.   Hypokalemia--resolved   Hyponatremia - Resolved  Fall zeb - Due to viral illness along with chronic spinal disease and chronic right foot drop. PT/OT recommended home health PT/OT: Patient refused it.  Wants to follow-up with outpatient PT.   AKI Dehydration - Creatinine 1.42 on presentation from prior of 1.1.  Improving to 1.27.  Treated with IV fluids.  Encourage oral intake.  Appetite still poor.  Syncope -Most likely vasovagal.  Happened while vomiting and having bowel movement.  No syncope since admission.  Telemetry unremarkable.  Last echo showed preserved LVEF and mild basal septal hypertrophy with normal diastolic parameters. - Patient has intermittent dizziness.  Can use meclizine  as needed.    Hypertension - Blood pressure intermittently on the higher side now.  Continue Coreg .  Resume Norvasc  and hydralazine  on discharge.  DVT - Continue Nucynta .  Outpatient follow-up with PCP and/or pain management.  Continue as needed muscle relaxant   Depression -Continue amitriptyline  and duloxetine    Discharge Instructions   Allergies as of 06/19/2024       Reactions   Cephalosporins Anaphylaxis   Penicillins Anaphylaxis, Hives   Anesthetics, Amide Nausea And Vomiting   Does not know name of it. States they put it on record foot [facility].    Betadine [povidone Iodine] Other (See Comments)   Skin Burn caused scar on Left buttock   Latex Hives, Itching, Rash, Other (See Comments)   Peach [prunus Persica] Hives   Xyzal  [levocetirizine] Itching   Patient took Xyzal  for the first time to see if med would help her seasonal  rhinitis d/t increased pollen - patient stated that she experienced intense itching over her entire body   Claritin [loratadine]    pruritis   Hydrocodone     Altered mental state, feel spaced out         Medication List     STOP taking these medications    fluconazole  200 MG tablet Commonly known as: DIFLUCAN    HYDROcodone   bit-homatropine 5-1.5 MG/5ML syrup Commonly known as: HYCODAN   pregabalin  300 MG capsule Commonly known as: LYRICA    spironolactone  50 MG tablet Commonly known as: ALDACTONE        TAKE these medications    acetaminophen  500 MG tablet Commonly known as: TYLENOL  Take 500 mg by mouth every 6 (six) hours as needed for headache.   ALPRAZolam  0.5 MG tablet Commonly known as: XANAX  Take 1/2 - 1 tablet by mouth at bedtime as needed for anxiety.   amitriptyline  25 MG tablet Commonly known as: ELAVIL  Take 1 tablet (25 mg total) by mouth daily.   amLODipine  5 MG tablet Commonly known as: NORVASC  Take 1 tablet (5 mg total) by mouth daily.   atorvastatin  20 MG tablet Commonly known as: LIPITOR Take 1 tablet (20 mg total) by mouth daily.   baclofen  10 MG tablet Commonly known as: LIORESAL  Take 1 tablet (10 mg total) by mouth 3 (three) times daily as needed.   benzonatate  100 MG capsule Commonly known as: TESSALON  Take 1 capsule (100 mg total) by mouth 3 (three) times daily.   betamethasone  dipropionate 0.05 % cream Apply as directed to skin once a day to itchy areas only, avoiding normal skin, as needed.   carvedilol  25 MG tablet Commonly known as: COREG  Take 1 tablet (25 mg total) by mouth 2 (two) times daily with a meal.   chlorpheniramine-HYDROcodone  10-8 MG/5ML Commonly known as: TUSSIONEX Take 5 mLs by mouth every 12 (twelve) hours as needed for cough.   cholecalciferol  25 MCG (1000 UNIT) tablet Commonly known as: VITAMIN D3 Take 1,000 Units by mouth daily.   diphenhydrAMINE  25 MG tablet Commonly known as: BENADRYL  Take 25 mg by mouth daily as needed for itching.   docusate sodium  100 MG capsule Commonly known as: COLACE Take 100 mg by mouth 2 (two) times daily.   DULoxetine  30 MG capsule Commonly known as: Cymbalta  Take 1 capsule (30 mg total) by mouth daily.   Femring  0.05 MG/24HR Ring Generic drug: Estradiol  Acetate USE ONE RING VAGINALLY EVERY 3  MONTHS AS DIRECTED   gabapentin  600 MG tablet Commonly known as: NEURONTIN  Take 0.5 tablets (300 mg total) by mouth 3 (three) times daily.   guaiFENesin  600 MG 12 hr tablet Commonly known as: MUCINEX  Take 2 tablets (1,200 mg total) by mouth 2 (two) times daily as needed.   hydrALAZINE  50 MG tablet Commonly known as: APRESOLINE  Take 1 tablet (50 mg total) by mouth 2 (two) times daily.   meclizine  25 MG tablet Commonly known as: ANTIVERT  Take 1 tablet (25 mg total) by mouth 3 (three) times daily as needed for dizziness.   methocarbamol  500 MG tablet Commonly known as: ROBAXIN  Take 1 tablet (500 mg total) by mouth 3 (three) times daily as needed for muscle spasm.   mometasone  50 MCG/ACT nasal spray Commonly known as: Nasonex  Place 2 sprays into the nose daily.   Nucynta  50 MG tablet Generic drug: tapentadol  Take 1 tablet (50 mg total) by mouth every 6 (six) to 8 (eight) hours as needed.   predniSONE  20 MG tablet Commonly known  as: DELTASONE  Take 2 tablets (40 mg total) by mouth daily with breakfast for 7 days. Start taking on: June 20, 2024 What changed: how much to take         Allergies  Allergen Reactions   Cephalosporins Anaphylaxis   Penicillins Anaphylaxis and Hives   Anesthetics, Amide Nausea And Vomiting    Does not know name of it. States they put it on record foot [facility].    Betadine [Povidone Iodine] Other (See Comments)    Skin Burn caused scar on Left buttock   Latex Hives, Itching, Rash and Other (See Comments)   Peach [Prunus Persica] Hives   Xyzal  [Levocetirizine] Itching    Patient took Xyzal  for the first time to see if med would help her seasonal rhinitis d/t increased pollen - patient stated that she experienced intense itching over her entire body   Claritin [Loratadine]     pruritis    Hydrocodone      Altered mental state, feel spaced out     Consultations: None   Procedures/Studies: CT Lumbar Spine Wo Contrast Result Date:  06/17/2024 CLINICAL DATA:  63 year old female with cough, syncope, headache, weakness. EXAM: CT LUMBAR SPINE WITHOUT CONTRAST TECHNIQUE: Multidetector CT imaging of the lumbar spine was performed without intravenous contrast administration. Multiplanar CT image reconstructions were also generated. RADIATION DOSE REDUCTION: This exam was performed according to the departmental dose-optimization program which includes automated exposure control, adjustment of the mA and/or kV according to patient size and/or use of iterative reconstruction technique. COMPARISON:  Portable chest x-ray today. Lumbar spine CT 07/12/2018 and lumbar MRI 01/12/2024. FINDINGS: Segmentation: Normal, the same numbering system used on MRI this year. Alignment: Mild dextroconvex lumbar scoliosis appears increased since 2019. Stable vertebral height and alignment since March, mild degenerative retrolisthesis of L2 on L3 (sagittal image 30). Vertebrae: Mild motion artifact. Chronic postoperative changes at L4 and L5 are detailed below. Stable vertebral height. Chronic L2 superior endplate Schmorl's node. No acute osseous abnormality identified. Intact visible sacrum and SI joints. Paraspinal and other soft tissues: Aortoiliac calcified atherosclerosis. Normal caliber abdominal aorta. Otherwise negative visible noncontrast abdominal viscera. Postoperative changes to the lower lumbar paraspinal soft tissues with no adverse features identified. Disc levels: By CT visible lower thoracic through L2-L3 spine degeneration and multifactorial spinal stenosis appears stable from the March MRI, moderate at L2-L3, mild-to-moderate at L3-L4. L4-L5 pedicle screws and decompression. No hardware loosening. Posterior bone graft material. No convincing solid arthrodesis at this time. L5-S1 level appears stable and negative. IMPRESSION: 1. No acute osseous abnormality in the Lumbar Spine. 2. L4-L5 decompression and fusion with no adverse hardware features. And  lumbar spine degeneration elsewhere appears stable from march MRI, with multifactorial lumbar spinal stenosis at L2-L3 and L3-L4. 3.  Aortic Atherosclerosis (ICD10-I70.0). Electronically Signed   By: VEAR Hurst M.D.   On: 06/17/2024 11:43   DG Chest Portable 1 View Result Date: 06/17/2024 CLINICAL DATA:  63 year old female with cough, syncope, headache, weakness. EXAM: PORTABLE CHEST 1 VIEW COMPARISON:  Chest radiographs 06/03/2024 and earlier. FINDINGS: Portable AP view at 0644 hours. Lung volumes and mediastinal contours are stable and within normal limits. Visualized tracheal air column is within normal limits. Allowing for portable technique the lungs are clear. No pneumothorax or pleural effusion. Negative visible bowel gas and osseous structures. IMPRESSION: Negative portable chest. Electronically Signed   By: VEAR Hurst M.D.   On: 06/17/2024 07:12   CT Head Wo Contrast Result Date: 06/17/2024 CLINICAL DATA:  63 year old female with  cough, syncope, headache, weakness. EXAM: CT HEAD WITHOUT CONTRAST TECHNIQUE: Contiguous axial images were obtained from the base of the skull through the vertex without intravenous contrast. RADIATION DOSE REDUCTION: This exam was performed according to the departmental dose-optimization program which includes automated exposure control, adjustment of the mA and/or kV according to patient size and/or use of iterative reconstruction technique. COMPARISON:  Head CT 05/13/2021. FINDINGS: Brain: Stable and normal cerebral volume for age. No midline shift, ventriculomegaly, mass effect, evidence of mass lesion, intracranial hemorrhage or evidence of cortically based acute infarction. Gray-white matter differentiation is within normal limits throughout the brain. Vascular: No suspicious intracranial vascular hyperdensity. Mild Calcified atherosclerosis at the skull base. Skull: Intact.  No acute osseous abnormality identified. Sinuses/Orbits: Mild bilateral paranasal sinus mucosal  thickening appears new or increased. Tympanic cavities and mastoids remain well aerated. Other: Visualized orbits and scalp soft tissues are within normal limits. IMPRESSION: 1. Stable and normal for age noncontrast CT appearance of the brain. 2. Minimal to mild new bilateral paranasal sinus inflammation. Electronically Signed   By: VEAR Hurst M.D.   On: 06/17/2024 07:11   DG Chest 2 View Result Date: 06/05/2024 CLINICAL DATA:  7 days of non productive cough and shortness of breath EXAM: CHEST - 2 VIEW COMPARISON:  May 13, 2021 FINDINGS: No focal airspace consolidation, pleural effusion, or pneumothorax. No cardiomegaly. No acute fracture or destructive lesion. Interval removal of the spinal stimulator leads. Multilevel thoracic osteophytosis. Cervical fusion hardware. IMPRESSION: No acute cardiopulmonary abnormality. Electronically Signed   By: Rogelia Myers M.D.   On: 06/05/2024 20:49      Subjective: Patient seen and examined at bedside.  Still feels weak with some cough and intermittent dizziness.  Feels okay to go home today.  Discharge Exam: Vitals:   06/19/24 0417 06/19/24 0729  BP: 136/80 (!) 162/104  Pulse: 82 88  Resp: 16 17  Temp: 98 F (36.7 C) 97.7 F (36.5 C)  SpO2: 97% 99%    General: Pt is alert, awake, not in acute distress.  On room air Cardiovascular: rate controlled, S1/S2 + Respiratory: bilateral decreased breath sounds at bases with scattered crackles Abdominal: Soft, NT, ND, bowel sounds + Extremities: no edema, no cyanosis    The results of significant diagnostics from this hospitalization (including imaging, microbiology, ancillary and laboratory) are listed below for reference.     Microbiology: Recent Results (from the past 240 hours)  Resp panel by RT-PCR (RSV, Flu A&B, Covid) Anterior Nasal Swab     Status: Abnormal   Collection Time: 06/17/24  6:14 AM   Specimen: Anterior Nasal Swab  Result Value Ref Range Status   SARS Coronavirus 2 by RT PCR  POSITIVE (A) NEGATIVE Final    Comment: (NOTE) SARS-CoV-2 target nucleic acids are DETECTED.  The SARS-CoV-2 RNA is generally detectable in upper respiratory specimens during the acute phase of infection. Positive results are indicative of the presence of the identified virus, but do not rule out bacterial infection or co-infection with other pathogens not detected by the test. Clinical correlation with patient history and other diagnostic information is necessary to determine patient infection status. The expected result is Negative.  Fact Sheet for Patients: BloggerCourse.com  Fact Sheet for Healthcare Providers: SeriousBroker.it  This test is not yet approved or cleared by the [place]  FDA and  has been authorized for detection and/or diagnosis of SARS-CoV-2 by FDA under an Emergency Use Authorization (EUA).  This EUA will remain in effect (meaning this test can  be used) for the duration of  the COVID-19 declaration under Section 564(b)(1) of the A ct, 21 U.S.C. section 360bbb-3(b)(1), unless the authorization is terminated or revoked sooner.     Influenza A by PCR NEGATIVE NEGATIVE Final   Influenza B by PCR NEGATIVE NEGATIVE Final    Comment: (NOTE) The Xpert Xpress SARS-CoV-2/FLU/RSV plus assay is intended as an aid in the diagnosis of influenza from Nasopharyngeal swab specimens and should not be used as a sole basis for treatment. Nasal washings and aspirates are unacceptable for Xpert Xpress SARS-CoV-2/FLU/RSV testing.  Fact Sheet for Patients: BloggerCourse.com  Fact Sheet for Healthcare Providers: SeriousBroker.it  This test is not yet approved or cleared by the [place]  FDA and has been authorized for detection and/or diagnosis of SARS-CoV-2 by FDA under an Emergency Use Authorization (EUA). This EUA will remain in effect (meaning this test can be used)  for the duration of the COVID-19 declaration under Section 564(b)(1) of the Act, 21 U.S.C. section 360bbb-3(b)(1), unless the authorization is terminated or revoked.     Resp Syncytial Virus by PCR NEGATIVE NEGATIVE Final    Comment: (NOTE) Fact Sheet for Patients: BloggerCourse.com  Fact Sheet for Healthcare Providers: SeriousBroker.it  This test is not yet approved or cleared by the [place]  FDA and has been authorized for detection and/or diagnosis of SARS-CoV-2 by FDA under an Emergency Use Authorization (EUA). This EUA will remain in effect (meaning this test can be used) for the duration of the COVID-19 declaration under Section 564(b)(1) of the Act, 21 U.S.C. section 360bbb-3(b)(1), unless the authorization is terminated or revoked.  Performed at [facility], [address] [place], [place] [address]      Labs: BNP (last 3 results) No results for input(s): BNP in the last 8760 hours. Basic Metabolic Panel: Recent Labs  Lab 06/17/24 0614 06/18/24 0557 06/19/24 0331  NA 137 134* 136  K 3.9 3.4* 5.0  CL 101 106 108  CO2 22 25 22   GLUCOSE 112* 131* 134*  BUN 11 7* 12  CREATININE 1.42* 1.22* 1.27*  CALCIUM  9.5 8.1* 8.6*  MG  --   --  1.8   Liver Function Tests: Recent Labs  Lab 06/19/24 0331  AST 20  ALT 19  ALKPHOS 52  BILITOT 0.4  PROT 6.0*  ALBUMIN 2.6*   No results for input(s): LIPASE, AMYLASE in the last 168 hours. No results for input(s): AMMONIA in the last 168 hours. CBC: Recent Labs  Lab 06/17/24 0614  WBC 8.7  NEUTROABS 6.2  HGB 12.8  HCT 38.8  MCV 90.2  PLT 241   Cardiac Enzymes: No results for input(s): CKTOTAL, CKMB, CKMBINDEX, TROPONINI in the last 168 hours. BNP: Invalid input(s): POCBNP CBG: No results for input(s): GLUCAP in the last 168 hours. D-Dimer No results for input(s): DDIMER in the last 72 hours. Hgb A1c No  results for input(s): HGBA1C in the last 72 hours. Lipid Profile No results for input(s): CHOL, HDL, LDLCALC, TRIG, CHOLHDL, LDLDIRECT in the last 72 hours. Thyroid  function studies No results for input(s): TSH, T4TOTAL, T3FREE, THYROIDAB in the last 72 hours.  Invalid input(s): FREET3 Anemia work up No results for input(s): VITAMINB12, FOLATE, FERRITIN, TIBC, IRON, RETICCTPCT in the last 72 hours. Urinalysis    Component Value Date/Time   COLORURINE COLORLESS (A) 06/17/2024 0942   APPEARANCEUR CLEAR 06/17/2024 0942   LABSPEC 1.009 06/17/2024 0942   PHURINE 6.0 06/17/2024 0942   GLUCOSEU NEGATIVE 06/17/2024 0942   HGBUR NEGATIVE  06/17/2024 0942   BILIRUBINUR NEGATIVE 06/17/2024 0942   KETONESUR NEGATIVE 06/17/2024 0942   PROTEINUR NEGATIVE 06/17/2024 0942   UROBILINOGEN 0.2 04/24/2009 1600   NITRITE NEGATIVE 06/17/2024 0942   LEUKOCYTESUR NEGATIVE 06/17/2024 0942   Sepsis Labs Recent Labs  Lab 06/17/24 0614  WBC 8.7   Microbiology Recent Results (from the past 240 hours)  Resp panel by RT-PCR (RSV, Flu A&B, Covid) Anterior Nasal Swab     Status: Abnormal   Collection Time: 06/17/24  6:14 AM   Specimen: Anterior Nasal Swab  Result Value Ref Range Status   SARS Coronavirus 2 by RT PCR POSITIVE (A) NEGATIVE Final    Comment: (NOTE) SARS-CoV-2 target nucleic acids are DETECTED.  The SARS-CoV-2 RNA is generally detectable in upper respiratory specimens during the acute phase of infection. Positive results are indicative of the presence of the identified virus, but do not rule out bacterial infection or co-infection with other pathogens not detected by the test. Clinical correlation with patient history and other diagnostic information is necessary to determine patient infection status. The expected result is Negative.  Fact Sheet for Patients: BloggerCourse.com  Fact Sheet for Healthcare  Providers: SeriousBroker.it  This test is not yet approved or cleared by the [place]  FDA and  has been authorized for detection and/or diagnosis of SARS-CoV-2 by FDA under an Emergency Use Authorization (EUA).  This EUA will remain in effect (meaning this test can be used) for the duration of  the COVID-19 declaration under Section 564(b)(1) of the A ct, 21 U.S.C. section 360bbb-3(b)(1), unless the authorization is terminated or revoked sooner.     Influenza A by PCR NEGATIVE NEGATIVE Final   Influenza B by PCR NEGATIVE NEGATIVE Final    Comment: (NOTE) The Xpert Xpress SARS-CoV-2/FLU/RSV plus assay is intended as an aid in the diagnosis of influenza from Nasopharyngeal swab specimens and should not be used as a sole basis for treatment. Nasal washings and aspirates are unacceptable for Xpert Xpress SARS-CoV-2/FLU/RSV testing.  Fact Sheet for Patients: BloggerCourse.com  Fact Sheet for Healthcare Providers: SeriousBroker.it  This test is not yet approved or cleared by the [place]  FDA and has been authorized for detection and/or diagnosis of SARS-CoV-2 by FDA under an Emergency Use Authorization (EUA). This EUA will remain in effect (meaning this test can be used) for the duration of the COVID-19 declaration under Section 564(b)(1) of the Act, 21 U.S.C. section 360bbb-3(b)(1), unless the authorization is terminated or revoked.     Resp Syncytial Virus by PCR NEGATIVE NEGATIVE Final    Comment: (NOTE) Fact Sheet for Patients: BloggerCourse.com  Fact Sheet for Healthcare Providers: SeriousBroker.it  This test is not yet approved or cleared by the [place]  FDA and has been authorized for detection and/or diagnosis of SARS-CoV-2 by FDA under an Emergency Use Authorization (EUA). This EUA will remain in effect (meaning this test can be  used) for the duration of the COVID-19 declaration under Section 564(b)(1) of the Act, 21 U.S.C. section 360bbb-3(b)(1), unless the authorization is terminated or revoked.  Performed at [facility], [address] [place], [place] [address]      Time coordinating discharge: 35 minutes  SIGNED:   Sophie Mao, MD  [facility] 06/19/2024, 10:19 AM

## 2024-06-19 NOTE — TOC CM/SW Note (Addendum)
 Referral received to assist pt with [facility] PT/OT. Pt plans to return home with the support of her family. Pt declined HH. She reports that prior to this admission she was going to outpt neuro rehab and she prefers outpt rehab. She reports that her husband can take her to the outpt therapy. She reports that her husband will transport her at time of DC. She denies any DC needs. Notified Dr. Cheryle of pt's preference for outpt rehab. Referral sent to neuro rehab.

## 2024-06-20 ENCOUNTER — Other Ambulatory Visit (HOSPITAL_COMMUNITY): Payer: Self-pay

## 2024-06-20 ENCOUNTER — Other Ambulatory Visit: Payer: Self-pay

## 2024-06-21 ENCOUNTER — Other Ambulatory Visit (HOSPITAL_COMMUNITY): Payer: Self-pay

## 2024-06-21 ENCOUNTER — Ambulatory Visit: Payer: Self-pay | Admitting: Physical Therapy

## 2024-06-21 ENCOUNTER — Encounter: Payer: Self-pay | Admitting: Family Medicine

## 2024-06-21 ENCOUNTER — Telehealth: Admitting: Family Medicine

## 2024-06-21 VITALS — Ht 65.0 in

## 2024-06-21 DIAGNOSIS — Z79899 Other long term (current) drug therapy: Secondary | ICD-10-CM

## 2024-06-21 DIAGNOSIS — U071 COVID-19: Secondary | ICD-10-CM | POA: Diagnosis not present

## 2024-06-21 DIAGNOSIS — R059 Cough, unspecified: Secondary | ICD-10-CM | POA: Diagnosis not present

## 2024-06-21 MED ORDER — HYDROCODONE BIT-HOMATROP MBR 5-1.5 MG/5ML PO SOLN
5.0000 mL | Freq: Two times a day (BID) | ORAL | 0 refills | Status: AC | PRN
  Filled 2024-06-21: qty 100, 10d supply, fill #0

## 2024-06-21 NOTE — Progress Notes (Signed)
 Virtual Visit via Video Note I connected with [name] on 06/21/2024 by a video enabled telemedicine application and verified that I am speaking with the correct person using two identifiers. Location patient: home Location provider:work office Persons participating in the virtual visit: patient, provider, and scribe.   I discussed the limitations of evaluation and management by telemedicine and the availability of in person appointments. The patient expressed understanding and agreed to proceed.  Chief Complaint  Patient presents with   Medication Refill   HPI: [name] is a 63 y.o. female  with PMHx significant for HTN, diastolic dysfunction, GERD,who is seen via virtual visit for a medication refill.  She was recently hospitalized from 8/22-8/24/25 for Covid-19.  She endorses a productive cough, but is unable to bring sputum up. He reports being prescribed a cough syrup at the time of hospital discharge but high copay.  She was unable to obtain the cough syrup from her pharmacy due to the increase price.  She was seen here in the office on 06/03/24 for cough and prescribed Hycodan, which she tolerated well and more affordable.  She states the pharmacy informed her this price difference was due to the way the prescription was ordered.  She has not had fever, chills, CP,SOB,abdominal pain,N/V, changes in bowel habits , or urinary symptoms.  Chronic pain on Nucynta  50 mg qid, she is not longer on Tramadol .  ROS: See pertinent positives and negatives per HPI.  Past Medical History:  Diagnosis Date   Anxiety    Aortic atherosclerosis (HCC) 04/16/2021   Atypical angina (HCC)    Back pain    related to spinal stenosis and disc problem, radiates down left buttocks to leg., weakness occ.   Chest pain    a. 03/2015 Cath: nl cors; b. 03/2021 Cor CTA: Ca2+ = 0. Nl Cors.   Chronic pain syndrome    Dyspnea    hx   GERD (gastroesophageal reflux disease)     Grade I diastolic dysfunction    Headache    Hyperlipidemia    Hypertension    Lumbar post-laminectomy syndrome    LVH (left ventricular hypertrophy) 12/15/2020   a. 11/2020 Echo: EF 65-70%, no rwma, sev asymm LVH with IVSd 1.9 cm. No LVOT obs @ rest. Gr1 DD. Triv MR.   PONV (postoperative nausea and vomiting)    Pulmonary nodules    a. 03/2021 CT Chest: 3mm pulm nodules in bilat lower lobes. F/u 1 yr.   Right foot drop    Syncope    a. 11/2020 Zio: No significant arrhythmias.   Vaginal foreign object    Uses Femring     Past Surgical History:  Procedure Laterality Date   ABDOMINAL HYSTERECTOMY     ANTERIOR CERVICAL DECOMP/DISCECTOMY FUSION N/A 01/03/2022   Procedure: Anterior Cervical Decompression Fusion - Cervical four-Cervical five - Cervical five-Cervical six;  Surgeon: Joshua Alm RAMAN, MD;  Location: [facility] OR;  Service: Neurosurgery;  Laterality: N/A;   BIOPSY  12/16/2020   Procedure: BIOPSY;  Surgeon: Aneita Gwendlyn DASEN, MD;  Location: [facility] ENDOSCOPY;  Service: Endoscopy;;   CARDIAC CATHETERIZATION N/A 04/18/2015   Procedure: Left Heart Cath and Coronary Angiography;  Surgeon: Rober Chroman, MD;  Location: [facility] INVASIVE CV LAB;  Service: Cardiovascular;  Laterality: N/A;   COLONOSCOPY     COLONOSCOPY W/ BIOPSIES AND POLYPECTOMY  2018   ESOPHAGOGASTRODUODENOSCOPY (EGD) WITH PROPOFOL  N/A 12/16/2020   Procedure: ESOPHAGOGASTRODUODENOSCOPY (EGD) WITH PROPOFOL ;  Surgeon: Aneita Gwendlyn DASEN, MD;  Location: [facility] ENDOSCOPY;  Service: Endoscopy;  Laterality: N/A;   FOOT SURGERY Bilateral    [facility] bunion,bone spur, tendon (1) -6'16, (1)-10'16   HEMATOMA EVACUATION N/A 01/05/2022   Procedure: Cervical Wound Exploration;  Surgeon: Gillie Duncans, MD;  Location: [facility] OR;  Service: Neurosurgery;  Laterality: N/A;   IR EPIDUROGRAPHY  07/21/2018   LUMBAR LAMINECTOMY/DECOMPRESSION MICRODISCECTOMY Bilateral 12/28/2015   Procedure: MICRO LUMBAR DECOMPRESSION L4 - L5 BILATERALLY;  Surgeon: Reyes Billing, MD;  Location: [facility] ORS;  Service: Orthopedics;  Laterality: Bilateral;   LUMBAR LAMINECTOMY/DECOMPRESSION MICRODISCECTOMY Bilateral 03/04/2018   Procedure: Revision of Microlumbar Decompression Bilateral Lumbar Four-Five;  Surgeon: Billing Reyes, MD;  Location: [facility] OR;  Service: Orthopedics;  Laterality: Bilateral;  90 mins   LUMBAR WOUND DEBRIDEMENT N/A 12/25/2023   Procedure: Irrigation and debridement of lumbar wound;  Surgeon: Joshua Alm Hamilton, MD;  Location: [facility] OR;  Service: Neurosurgery;  Laterality: N/A;   POSTERIOR FUSION PEDICLE SCREW PLACEMENT N/A 12/07/2023   Procedure: LUMBAR FOUR-FIVE POSTERIOR LATERAL FUSION;  Surgeon: Joshua Alm Hamilton, MD;  Location: [facility] OR;  Service: Neurosurgery;  Laterality: N/A;   SAVORY DILATION N/A 12/16/2020   Procedure: SAVORY DILATION;  Surgeon: Aneita Gwendlyn DASEN, MD;  Location: [facility] ENDOSCOPY;  Service: Endoscopy;  Laterality: N/A;   SPINAL CORD STIMULATOR INSERTION N/A 09/28/2019   Procedure: THORACIC SPINAL CORD STIMULATOR INSERTION;  Surgeon: Burnetta Aures, MD;  Location: [facility] OR;  Service: Orthopedics;  Laterality: N/A;  2.5 hrs   SPINAL CORD STIMULATOR REMOVAL N/A 05/27/2021   Procedure: LUMBAR SPINAL CORD STIMULATOR REMOVAL;  Surgeon: Bluford Standing, MD;  Location: [facility] ORS;  Service: Neurosurgery;  Laterality: N/A;   TUBAL LIGATION     WISDOM TOOTH EXTRACTION     WOUND EXPLORATION N/A 03/04/2018   Procedure: EXPLORATION OF LUMBAR DECOMPRESSION WOUND;  Surgeon: Billing Reyes, MD;  Location: [facility] OR;  Service: Orthopedics;  Laterality: N/A;    Family History  Problem Relation Age of Onset   Heart attack Mother    Lung cancer Father    Cancer Father    Pancreatic cancer Sister    Breast cancer Sister 26   Multiple myeloma Sister    Breast cancer Sister        diagnosed in her 74's   Heart attack Sister    Throat cancer Brother    Lung cancer Brother    Stomach cancer Cousin    Colon cancer Neg Hx    Colon polyps Neg Hx    Esophageal  cancer Neg Hx    Rectal cancer Neg Hx     Social History   Socioeconomic History   Marital status: Married    Spouse name: Rayne Burnetta   Number of children: 1   Years of education: Not on file   Highest education level: Not on file  Occupational History   Not on file  Tobacco Use   Smoking status: Never   Smokeless tobacco: Never  Vaping Use   Vaping status: Never Used  Substance and Sexual Activity   Alcohol use: Never   Drug use: Never   Sexual activity: Yes    Partners: Male    Birth control/protection: Post-menopausal, Surgical    Comment: Hysterectomy  Other Topics Concern   Not on file  Social History Narrative   Not on file   Social Drivers of Health   Financial Resource Strain: Low Risk  (05/02/2024)   Overall Financial Resource Strain (CARDIA)    Difficulty of Paying Living Expenses: Not very hard  Food  Insecurity: No Food Insecurity (06/17/2024)   Hunger Vital Sign    Worried About Running Out of Food in the Last Year: Never true    Ran Out of Food in the Last Year: Never true  Transportation Needs: No Transportation Needs (06/17/2024)   PRAPARE - Administrator, Civil Service (Medical): No    Lack of Transportation (Non-Medical): No  Physical Activity: Insufficiently Active (05/02/2024)   Exercise Vital Sign    Days of Exercise per Week: 2 days    Minutes of Exercise per Session: 30 min  Stress: No Stress Concern Present (10/02/2023)   [employer] of Occupational Health - Occupational Stress Questionnaire    Feeling of Stress : Not at all  Social Connections: Socially Integrated (06/14/2024)   Social Connection and Isolation Panel    Frequency of Communication with Friends and Family: More than three times a week    Frequency of Social Gatherings with Friends and Family: More than three times a week    Attends Religious Services: More than 4 times per year    Active Member of [employer] or Organizations: Yes    Attends Hospital doctor: More than 4 times per year    Marital Status: Married  Catering manager Violence: Not At Risk (06/17/2024)   Humiliation, Afraid, Rape, and Kick questionnaire    Fear of Current or Ex-Partner: No    Emotionally Abused: No    Physically Abused: No    Sexually Abused: No     Current Outpatient Medications:    acetaminophen  (TYLENOL ) 500 MG tablet, Take 500 mg by mouth every 6 (six) hours as needed for headache., Disp: , Rfl:    ALPRAZolam  (XANAX ) 0.5 MG tablet, Take 1/2 - 1 tablet by mouth at bedtime as needed for anxiety., Disp: 30 tablet, Rfl: 3   amitriptyline  (ELAVIL ) 25 MG tablet, Take 1 tablet (25 mg total) by mouth daily., Disp: 90 tablet, Rfl: 2   amLODipine  (NORVASC ) 5 MG tablet, Take 1 tablet (5 mg total) by mouth daily., Disp: 90 tablet, Rfl: 2   atorvastatin  (LIPITOR) 20 MG tablet, Take 1 tablet (20 mg total) by mouth daily., Disp: 90 tablet, Rfl: 2   baclofen  (LIORESAL ) 10 MG tablet, Take 1 tablet (10 mg total) by mouth 3 (three) times daily as needed., Disp: 60 each, Rfl: 3   benzonatate  (TESSALON ) 100 MG capsule, Take 1 capsule (100 mg total) by mouth 3 (three) times daily., Disp: 20 capsule, Rfl: 0   betamethasone  dipropionate 0.05 % cream, Apply as directed to skin once a day to itchy areas only, avoiding normal skin, as needed., Disp: 45 g, Rfl: 0   carvedilol  (COREG ) 25 MG tablet, Take 1 tablet (25 mg total) by mouth 2 (two) times daily with a meal., Disp: 180 tablet, Rfl: 3   cholecalciferol  (VITAMIN D3) 25 MCG (1000 UNIT) tablet, Take 1,000 Units by mouth daily., Disp: , Rfl:    diphenhydrAMINE  (BENADRYL ) 25 MG tablet, Take 25 mg by mouth daily as needed for itching., Disp: , Rfl:    docusate sodium  (COLACE) 100 MG capsule, Take 100 mg by mouth 2 (two) times daily., Disp: , Rfl:    DULoxetine  (CYMBALTA ) 30 MG capsule, Take 1 capsule (30 mg total) by mouth daily., Disp: , Rfl:    Estradiol  Acetate (FEMRING ) 0.05 MG/24HR RING, USE ONE RING VAGINALLY EVERY 3 MONTHS  AS DIRECTED, Disp: 1 each, Rfl: 4   gabapentin  (NEURONTIN ) 600 MG tablet, Take 0.5 tablets (300 mg  total) by mouth 3 (three) times daily., Disp: , Rfl:    guaiFENesin  (MUCINEX ) 600 MG 12 hr tablet, Take 2 tablets (1,200 mg total) by mouth 2 (two) times daily as needed., Disp: 30 tablet, Rfl: 0   hydrALAZINE  (APRESOLINE ) 50 MG tablet, Take 1 tablet (50 mg total) by mouth 2 (two) times daily., Disp: , Rfl:    HYDROcodone  bit-homatropine (HYCODAN) 5-1.5 MG/5ML syrup, Take 5 mLs by mouth every 12 (twelve) hours as needed for up to 10 days., Disp: 100 mL, Rfl: 0   meclizine  (ANTIVERT ) 25 MG tablet, Take 1 tablet (25 mg total) by mouth 3 (three) times daily as needed for dizziness., Disp: 30 tablet, Rfl: 0   methocarbamol  (ROBAXIN ) 500 MG tablet, Take 1 tablet (500 mg total) by mouth 3 (three) times daily as needed for muscle spasm., Disp: 30 tablet, Rfl: 6   mometasone  (NASONEX ) 50 MCG/ACT nasal spray, Place 2 sprays into the nose daily., Disp: 17 g, Rfl: 3   predniSONE  (DELTASONE ) 20 MG tablet, Take 2 tablets (40 mg total) by mouth daily with breakfast for 7 days., Disp: 14 tablet, Rfl: 0   tapentadol  (NUCYNTA ) 50 MG tablet, Take 1 tablet (50 mg total) by mouth every 6 (six) to 8 (eight) hours as needed., Disp: 60 tablet, Rfl: 0  EXAM:  VITALS per patient if applicable:Ht 5' 5 (1.651 m)   BMI 27.29 kg/m   GENERAL: alert, oriented, appears well and in no acute distress  HEENT: atraumatic, conjunctiva clear, no obvious abnormalities on inspection of external nose and ears  NECK: normal movements of the head and neck  LUNGS: on inspection no signs of respiratory distress, breathing rate appears normal, no obvious gross SOB, gasping or wheezing. No cough during visit.  CV: no obvious cyanosis  MS: moves all visible extremities without noticeable abnormality  PSYCH/NEURO: pleasant and cooperative, no obvious depression or anxiety, speech and thought processing grossly intact  ASSESSMENT AND  PLAN:  Discussed the following assessment and plan:  Cough, unspecified type Explained that cough and congestion can last a few more days and even weeks after acute symptoms have resolved. She tolerated Hycodan before, no side effects. Recommend Hycodan 5 ml bid, mainly at bedtime to help with cough. We discussed some side effects.  -     HYDROcodone  Bit-Homatrop MBr; Take 5 mLs by mouth every 12 (twelve) hours as needed for up to 10 days.  Dispense: 100 mL; Refill: 0  COVID-19 virus infection CXR 06/17/24 with no signs of pneumonia. Completed Prednisone  treatment. Continue monitoring for fever. Instructed about warning signs.  High risk medication use She is on Nucynta  and Alprazolam . Today Hycodan added for short time for cough. We discussed some side effects and the risk of med interaction.  We discussed possible serious and likely etiologies, options for evaluation and workup, limitations of telemedicine visit vs in person visit, treatment, treatment risks and precautions. The patient was advised to call back or seek an in-person evaluation if the symptoms worsen or if the condition fails to improve as anticipated. I discussed the assessment and treatment plan with the patient. The patient was provided an opportunity to ask questions and all were answered. The patient agreed with the plan and demonstrated an understanding of the instructions.  Return if symptoms worsen or fail to improve.  I, Vernell Forest, acting as a scribe for Sophea Rackham [place], MD., have documented all relevant documentation on the behalf of Kaegan Stigler [place], MD, as directed by   while in the  presence of Chayanne Filippi [place], MD.  I, Clerance Umland [place], MD, have reviewed all documentation for this visit. The documentation on 06/21/24 for the exam, diagnosis, procedures, and orders are all accurate and complete.  Aaditya Letizia G. [place], MD

## 2024-06-21 NOTE — Telephone Encounter (Signed)
 Spoke to pt and she stated that she has been hospitalized due to illness. Pt claimed she became SOB. Pt was rushed to the ED dx with Covid. Pt stated that the cough is bad and she has been trying to do take OTC with no relief. Pt requesting hycodan.

## 2024-06-21 NOTE — Telephone Encounter (Signed)
Will send to PCP for advise.

## 2024-06-21 NOTE — Telephone Encounter (Signed)
 Spoke with pt, VV scheduled for today.

## 2024-06-23 ENCOUNTER — Other Ambulatory Visit: Payer: Self-pay

## 2024-06-23 ENCOUNTER — Ambulatory Visit: Payer: Self-pay | Admitting: Physical Therapy

## 2024-06-23 ENCOUNTER — Other Ambulatory Visit (HOSPITAL_COMMUNITY): Payer: Self-pay

## 2024-06-23 MED ORDER — METHYLPREDNISOLONE 4 MG PO TBPK
ORAL_TABLET | ORAL | 0 refills | Status: DC
  Filled 2024-06-23: qty 21, 6d supply, fill #0

## 2024-06-23 MED ORDER — METHOCARBAMOL 500 MG PO TABS
500.0000 mg | ORAL_TABLET | Freq: Three times a day (TID) | ORAL | 1 refills | Status: AC | PRN
  Filled 2024-06-23: qty 30, 10d supply, fill #0
  Filled 2024-10-10 (×2): qty 30, 10d supply, fill #1

## 2024-06-28 ENCOUNTER — Ambulatory Visit: Payer: Self-pay | Attending: Family Medicine | Admitting: Physical Therapy

## 2024-06-28 ENCOUNTER — Other Ambulatory Visit (HOSPITAL_COMMUNITY): Payer: Self-pay

## 2024-06-28 ENCOUNTER — Ambulatory Visit: Payer: Self-pay | Admitting: Physical Therapy

## 2024-06-28 DIAGNOSIS — M6281 Muscle weakness (generalized): Secondary | ICD-10-CM | POA: Diagnosis not present

## 2024-06-28 DIAGNOSIS — R2689 Other abnormalities of gait and mobility: Secondary | ICD-10-CM | POA: Diagnosis not present

## 2024-06-28 DIAGNOSIS — R2681 Unsteadiness on feet: Secondary | ICD-10-CM | POA: Insufficient documentation

## 2024-06-28 DIAGNOSIS — M5459 Other low back pain: Secondary | ICD-10-CM | POA: Insufficient documentation

## 2024-06-28 NOTE — Therapy (Unsigned)
 OUTPATIENT PHYSICAL THERAPY NEURO TREATMENT NOTE/RE-CERT        Patient Name: [name] MRN: [id number] DOB:[birth year], 63 y.o., female Today's Date: 06/29/2024   PCP: [place], [name]., MD REFERRING PROVIDER: Babs Arthea DASEN, MD  END OF SESSION:  PT End of Session - 06/29/24 0945     Visit Number 21    Number of Visits 36    Date for PT Re-Evaluation 08/19/24    Authorization Type [employer] Medicare    Authorization Time Period 02-16-24 -04-25-24:  04-19-24 - 06-26-24;  06-28-24 - 08-26-24    Progress Note Due on Visit 30    PT Start Time 1020    PT Stop Time 1106    PT Time Calculation (min) 46 min    Activity Tolerance Patient limited by pain    Behavior During Therapy [facility] for tasks assessed/performed                              Past Medical History:  Diagnosis Date   Anxiety    Aortic atherosclerosis (HCC) 04/16/2021   Atypical angina (HCC)    Back pain    related to spinal stenosis and disc problem, radiates down left buttocks to leg., weakness occ.   Chest pain    a. 03/2015 Cath: nl cors; b. 03/2021 Cor CTA: Ca2+ = 0. Nl Cors.   Chronic pain syndrome    Dyspnea    hx   GERD (gastroesophageal reflux disease)    Grade I diastolic dysfunction    Headache    Hyperlipidemia    Hypertension    Lumbar post-laminectomy syndrome    LVH (left ventricular hypertrophy) 12/15/2020   a. 11/2020 Echo: EF 65-70%, no rwma, sev asymm LVH with IVSd 1.9 cm. No LVOT obs @ rest. Gr1 DD. Triv MR.   PONV (postoperative nausea and vomiting)    Pulmonary nodules    a. 03/2021 CT Chest: 3mm pulm nodules in bilat lower lobes. F/u 1 yr.   Right foot drop    Syncope    a. 11/2020 Zio: No significant arrhythmias.   Vaginal foreign object    Uses Femring    Past Surgical History:  Procedure Laterality Date   ABDOMINAL HYSTERECTOMY     ANTERIOR CERVICAL DECOMP/DISCECTOMY FUSION N/A 01/03/2022   Procedure: Anterior Cervical Decompression Fusion - Cervical  four-Cervical five - Cervical five-Cervical six;  Surgeon: Joshua Alm RAMAN, MD;  Location: [facility] OR;  Service: Neurosurgery;  Laterality: N/A;   BIOPSY  12/16/2020   Procedure: BIOPSY;  Surgeon: Aneita Gwendlyn DASEN, MD;  Location: [facility] ENDOSCOPY;  Service: Endoscopy;;   CARDIAC CATHETERIZATION N/A 04/18/2015   Procedure: Left Heart Cath and Coronary Angiography;  Surgeon: Rober Chroman, MD;  Location: [facility] INVASIVE CV LAB;  Service: Cardiovascular;  Laterality: N/A;   COLONOSCOPY     COLONOSCOPY W/ BIOPSIES AND POLYPECTOMY  2018   ESOPHAGOGASTRODUODENOSCOPY (EGD) WITH PROPOFOL  N/A 12/16/2020   Procedure: ESOPHAGOGASTRODUODENOSCOPY (EGD) WITH PROPOFOL ;  Surgeon: Aneita Gwendlyn DASEN, MD;  Location: [facility] ENDOSCOPY;  Service: Endoscopy;  Laterality: N/A;   FOOT SURGERY Bilateral    [facility] bunion,bone spur, tendon (1) -6'16, (1)-10'16   HEMATOMA EVACUATION N/A 01/05/2022   Procedure: Cervical Wound Exploration;  Surgeon: Gillie Duncans, MD;  Location: [facility] OR;  Service: Neurosurgery;  Laterality: N/A;   IR EPIDUROGRAPHY  07/21/2018   LUMBAR LAMINECTOMY/DECOMPRESSION MICRODISCECTOMY Bilateral 12/28/2015   Procedure: MICRO LUMBAR DECOMPRESSION L4 - L5 BILATERALLY;  Surgeon: Reyes  Duwayne, MD;  Location: [facility] ORS;  Service: Orthopedics;  Laterality: Bilateral;   LUMBAR LAMINECTOMY/DECOMPRESSION MICRODISCECTOMY Bilateral 03/04/2018   Procedure: Revision of Microlumbar Decompression Bilateral Lumbar Four-Five;  Surgeon: Duwayne Purchase, MD;  Location: [facility] OR;  Service: Orthopedics;  Laterality: Bilateral;  90 mins   LUMBAR WOUND DEBRIDEMENT N/A 12/25/2023   Procedure: Irrigation and debridement of lumbar wound;  Surgeon: Joshua Alm Hamilton, MD;  Location: [facility] OR;  Service: Neurosurgery;  Laterality: N/A;   POSTERIOR FUSION PEDICLE SCREW PLACEMENT N/A 12/07/2023   Procedure: LUMBAR FOUR-FIVE POSTERIOR LATERAL FUSION;  Surgeon: Joshua Alm Hamilton, MD;  Location: [facility] OR;  Service: Neurosurgery;  Laterality: N/A;   SAVORY  DILATION N/A 12/16/2020   Procedure: SAVORY DILATION;  Surgeon: Aneita Gwendlyn DASEN, MD;  Location: [facility] ENDOSCOPY;  Service: Endoscopy;  Laterality: N/A;   SPINAL CORD STIMULATOR INSERTION N/A 09/28/2019   Procedure: THORACIC SPINAL CORD STIMULATOR INSERTION;  Surgeon: Burnetta Aures, MD;  Location: [facility] OR;  Service: Orthopedics;  Laterality: N/A;  2.5 hrs   SPINAL CORD STIMULATOR REMOVAL N/A 05/27/2021   Procedure: LUMBAR SPINAL CORD STIMULATOR REMOVAL;  Surgeon: Bluford Standing, MD;  Location: [facility] ORS;  Service: Neurosurgery;  Laterality: N/A;   TUBAL LIGATION     WISDOM TOOTH EXTRACTION     WOUND EXPLORATION N/A 03/04/2018   Procedure: EXPLORATION OF LUMBAR DECOMPRESSION WOUND;  Surgeon: Duwayne Purchase, MD;  Location: [facility] OR;  Service: Orthopedics;  Laterality: N/A;   Patient Active Problem List   Diagnosis Date Noted   Vitamin D  deficiency, unspecified 04/13/2024   Seasonal allergic rhinitis due to pollen 01/29/2024   Delayed wound healing 12/25/2023   S/P lumbar fusion 12/07/2023   Pain in thumb joint with movement of right hand 08/12/2023   Localized primary osteoarthritis of carpometacarpal joint of right thumb 07/29/2023   Right foot drop 07/29/2023   Frequent falls 04/14/2023   Gastroesophageal reflux disease 04/14/2023   Right upper extremity numbness 09/29/2022   Stage 3a chronic kidney disease (HCC) 09/29/2022   Cervical spondylosis with radiculopathy 01/05/2022   S/P cervical spinal fusion 01/03/2022   Cervical disc disorder with radiculopathy of cervical region 11/27/2021   Uncontrolled pain 05/27/2021   Syncope 05/13/2021   Depression with anxiety 05/13/2021   Chronic diastolic CHF (congestive heart failure) (HCC) 05/13/2021   Chronic back pain 05/13/2021   Chest tightness 05/13/2021   Lower abdominal pain 05/13/2021   Syncope and collapse 01/01/2021   Abnormal echocardiogram 01/01/2021   BPPV (benign paroxysmal positional vertigo), left 12/26/2020   Odynophagia     Dysphagia 12/15/2020   Postural dizziness with presyncope 12/14/2020   Lumbar post-laminectomy syndrome    Chronic pain 09/28/2019   Lumbar spinal stenosis    Radicular pain    Gait disorder    Difficulty in urination 07/13/2018   Back pain 07/12/2018   Hyperlipidemia 04/16/2018   GAD (generalized anxiety disorder) 04/16/2018   AKI (acute kidney injury) (HCC)    Benign essential HTN    Major depression, recurrent (HCC)    Lumbar radiculopathy 03/09/2018   Paraparesis (HCC) 03/09/2018   Essential hypertension    Chronic nonintractable headache    Leukocytosis    Acute blood loss anemia    Postoperative pain    Generalized weakness    Spinal stenosis at L4-L5 level 03/04/2018   Spinal stenosis of lumbar region 12/28/2015   Chest pain 04/16/2015    ONSET DATE: Most recent Surgery date 12-07-23:  Referral date 02-03-24  REFERRING DIAG:  M54.16 (ICD-10-CM) -  Lumbar radiculopathy M96.1 (ICD-10-CM) - Lumbar post-laminectomy syndrome R26.9 (ICD-10-CM) - Gait disorder  THERAPY DIAG:  Other low back pain  Other abnormalities of gait and mobility  Muscle weakness (generalized)  Unsteadiness on feet  Rationale for Evaluation and Treatment: Rehabilitation  SUBJECTIVE:                                                                                                                                                                                             SUBJECTIVE STATEMENT: Pt returns to PT -  previous PT session attended on 06-09-24.  Pt was hospitalized at [facility] with Covid from 8-22 06-19-24.  Pt sustained a fall while standing with nurse present - nurse unable to catch pt to prevent the fall per pt report.  Pt reports her RLE was weak and she did not haver her AFO on - pt states her Rt leg gave way in standing and she fell and hit the floor.  X-rays were completed to assess hardware in her low back - pt reports x-rays revealed no loosening or breakage of hardware.  Pt reports she has not  moved very much since discharge home on 06-19-24.  Has been using ice a lot on her low back.  Pt reports she is having combination of pain and soreness in low back at this time. Pt accompanied by: self  PERTINENT HISTORY: The patient was admitted on 12/07/2023 &  underwent PLIF L4-5. Hospitalized 12-07-23 - 12-11-23:  The patient was admitted on 12/25/2023 and taken to the operating room where the patient underwent irrigation and debridement of lumbar wound with primary closure and placement of external wound VAC- hospitalized 12-25-23 -  12-26-23  Frequent falls:  Spinal cord stimulator insertion on 09-28-19; stimulator removal on 05-27-21 with resultant LLE weakness;  decompression and laminectomy on 12/28/15 at L4-L5  with redo decompression surgery on May 9,2019, post-op paraparesis with surgery to remove small hematoma; 07/12/18 fall at work (involved rolling chair) with hospital admission, transfer to CIR with d/c home 08/03/18; HTN, Bil foot surgery; left ventricular hypertrophy, chronic pain syndrome, dyspnea, hypertrophic cardiomyopathy; cervical fusion March 2023;  had carpal tunnel surgery RUE in April 2024:  LVH  PAIN:  Are you having pain? Pt rates pain 8/10 in lumbar region - greater pain in lower lumbar region near sacral area- same burning sensation Alleviating Factor:  placing pillow behind back when sitting Aggravating Factors: certain movements    PRECAUTIONS: Fall; No bending/lifting or twisting; pt states she has a reacher  WEIGHT BEARING RESTRICTIONS: No  FALLS: Has patient fallen in last 6 months? Yes. Number of falls 3  LIVING  ENVIRONMENT: Lives with: lives with their spouse Lives in: House/apartment Stairs: Yes: External: 5 steps; on right going up Has following equipment at home: Single point cane, Wheelchair (manual), and [place]strand crutch  PLOF: Independent with basic ADLs, Independent with household mobility with device, Independent with community mobility with device, and  Independent with transfers  PATIENT GOALS: improve walking and strength Rt leg  OBJECTIVE:   DIAGNOSTIC FINDINGS: IMPRESSION: 1. Interval anterior discectomy and fusion from C4 through C6 with improved patency of the spinal canal and only mild residual foraminal narrowing. 2. Stable mild spinal stenosis and mild to moderate left foraminal narrowing at C3-4. 3. Stable mild spinal stenosis and mild foraminal narrowing bilaterally at C6-7. Mild foraminal narrowing bilaterally at C7-T1. 4. No cord deformity or abnormal cord signal.  No acute findings. No recent MRI of lumbar region  COGNITION: Overall cognitive status: Within functional limits for tasks assessed   SENSATION: WFL  COORDINATION: Decreased RLE due to weakness and spasticity  POSTURE: No Significant postural limitations  LOWER EXTREMITY ROM:   LLE WFL's;  RLE WFL's except for ankle ROM which is minimal due to weakness; Pt has difficulty performing Rt knee extension due to Rt quad weakness and spasticity - turned body slightly to Lt to achieve more knee extension   LOWER EXTREMITY MMT:    MMT Right Eval Left Eval  Hip flexion 3- 3+   Hip extension    Hip abduction    Hip adduction    Hip internal rotation    Hip external rotation    Knee flexion 3+   Knee extension 4- 4  Ankle dorsiflexion 2-   Ankle plantarflexion    Ankle inversion    Ankle eversion    (Blank rows = not tested)  BED MOBILITY:  Modified independent  TRANSFERS: Assistive device utilized: Rollator  Sit to stand: Modified independence Stand to sit: Modified independence   STAIRS:  Not tested due to c/o back pain   GAIT: Gait pattern: decreased step length- Right, decreased stance time- Right, decreased hip/knee flexion- Right, and decreased ankle dorsiflexion- Right Distance walked: 50' Assistive device utilized: with rollator Level of assistance: SBA and CGA Comments: slow gait speed due to c/o back pain and RLE  weakness  FUNCTIONAL TESTS:  Sit to stand score - 2 reps - with bil. UE support from chair- 29.91 secs             TUG score: - 49.19 secs with rollator  Gait velocity:  37.38 secs = .88 ft/sec with rollator  PATIENT SURVEYS:  N/A - based on diagnosis of RLE weakness due to chronic lumbar radiculopathy   TODAY'S TREATMENT: 06-28-24                                                                                                                           Gait: Pt using rollator and wearing Blue rocker AFO on RLE - pt modified independent with use of rollator; ambulating very  slowly due to c/o low back pain  Pt amb. Clinic distances with rollator with supervision  TUG score = 43.47 secs with rollator Gait velocity = 36.22, 38.09 secs with rollator = .91 ft/sec with rollator   Self Care:  discussed change in status since previous PT appt on 06-09-24 due to hospitalization with Covid (8-22 - 06-19-24) and due to fall sustained in hospital resulting in significant increase in low back pain and soreness;  pt reports she has cancelled the CTS scheduled in Sept. At this time due to increased back pain. Pt states they wanted to order [facility] PT for her but she declined and requested to return to OP PT at this facility as she was receiving prior to hospitalization.  Pt requesting renewal for continuation of PT services.  TherEx: 5x sit to stand with bil. UE support from chair = 42.97 secs   Pt performed following exercises in supine position to reduce back pain and to increase strength in low back musc. and ROM of lumbar spine:   Pelvic tilt 10 reps 5 sec hold Pelvic tilt with heel slide - 10 reps RLE:  LLE 10 reps Small range trunk rotation to Lt and Rt sides 5 times each side - approx. 2-3 sec hold Pelvic tilt with hip flexion in hooklying - 5 reps each leg  Pt reported increased back pain after these exercises - pt sat in chair -Ice pack applied to low back for pain management x  10     Medbridge:   Access Code: [email or web address] URL: [email or web address] Date: 05/23/2024 Prepared by: Rock Kussmaul  Exercises - Supine Posterior Pelvic Tilt  - 1 x daily - 7 x weekly - 1 sets - 10 reps - 5 sec hold - Supine Heel Slides  - 1 x daily - 7 x weekly - 1 sets - 10 reps - Lifting leg up/down in this position  - 1 x daily - 7 x weekly - 1 sets - 10 reps - Supine Lower Trunk Rotation  - 1 x daily - 7 x weekly - 1 sets - 5 reps - Supine Bridge with Pelvic Floor Contraction  - 1 x daily - 7 x weekly - 1 sets - 5 reps - 3 sec hold   UPDATED HEP: Access Code: [email or web address] URL: [email or web address] Date: 04/27/2024 Prepared by: Rock Kussmaul  Exercises - Seated Knee Extension with Resistance  - 1 x daily - 7 x weekly - 3 sets - 10 reps - TAP UPS - with Left leg   - 1 x daily - 7 x weekly - 3 sets - 10 reps - Standing March with Counter Support  - 1 x daily - 7 x weekly - 3 sets - 10 reps - Forward Step Up with Counter Support  - 1 x daily - 7 x weekly - 3 sets - 10 reps - Alternating Step Forward with Support  - 1 x daily - 7 x weekly - 3 sets - 10 reps   PATIENT EDUCATION: Education details:  Medbridge  Person educated: Patient Education method: Explanation - handout declined as pt stated she has it from previous OP admission Education comprehension: verbalized understanding  HOME EXERCISE PROGRAM:   GOALS: Goals reviewed with patient? Yes    UPDATED LONG TERM GOALS: Target date: 06-10-24   Amb. 350' with RW with SBA without requiring seated rest period. Baseline: 134' x 1, 87' x 1 rep - 04-19-24;  measure lobby Goal status: Not assessed on 06-28-24 due to c/o increased  low back pain  2.  Improve TUG score to </= 22 secs  with use of RW for improved mobility and reduced fall risk. Baseline: 49.19 secs with rollator;  28.5 secs with rollator - 04-19-24;  43.47 secs with rollator - 06-28-24 Goal status:  Ongoing  3.  Perform 5x sit to stand  transfers with LUE support only to demo improved strength in RLE IN </= 25 secs to demo improved functional mobility. Baseline: 36.16 secs - 4x with bil. UE support from chair - had to stop due to burning sensation in low back;  42.97 secs 06-28-24 Goal status:  Ongoing  4.  Increase gait velocity to >/= 1.6 ft/sec with rollator for increased gait efficiency with reduced fall risk.          Baseline: 37.38 secs = .88 ft/sec with rollator;  33.44 secs = .98 ft/sec with rollator;  36.22, 38.09 secs          Goal status:  ONGOING   6.  Independent in updated HEP for RLE strengthening. Baseline:  Goal status:  Goal met 06-09-24   7.  Report reduced low back pain at rest to </= 3/10 intensity.            Baseline:  7/10;    04-20-24 -- 5/10;    5/10 = 06-09-24;  06-28-24 = 8/10 intensity            Goal status: Not met 06-09-24; not met 06-28-24   UPDATED SHORT TERM GOALS:  TARGET DATE 07-22-24:  1.  Amb. 230' with RW with SBA without requiring seated rest period.             Baseline: 134' x 1, 87' x 1 rep on 04-19-24;  approx. 100' from lobby to mat table in gym with rollator on 06-28-24     Goal status:  Ongoing  2.  Improve TUG score to </= 30 secs  with use of RW for improved mobility and reduced fall risk. Baseline: 49.19 secs with rollator;  28.5 secs with rollator; 43.47 secs on 06-28-24 Goal status:  Revised  3.  Perform 5x sit to stand from chair with bil. UE support in </= 30 secs to demonstrate improved mobility.     Baseline:  36.16 secs - 4 reps with bil. UE support from chair:  42.97 secs 06-28-24    Goal status:  Ongoing     4.  Pt will report reduced pain in low back to </= 5/10 intensity for increased ease with ADL's and mobility.    Baseline:  5/10 on 04-19-24:  8/10 on 06-28-24    Goal status:  Revised   UPDATED LONG TERM GOALS: Target date: 08-19-24   Amb. 350' with RW with SBA without requiring seated rest period. Baseline: 134' x 1, 87' x 1 rep - 04-19-24;  measure lobby Goal  status: Not assessed on 06-28-24 due to c/o increased low back pain; Ongoing goal  2.  Improve TUG score to </= 25 secs  with use of RW for improved mobility and reduced fall risk. Baseline: 49.19 secs with rollator;  28.5 secs with rollator - 04-19-24;  43.47 secs with rollator - 06-28-24 Goal status:  Revised   3.  Perform 5x sit to stand transfers with LUE support only to demo improved strength in RLE IN </= 25 secs to demo improved functional mobility. Baseline: 36.16 secs - 4x with bil. UE support from chair - had to stop due to burning sensation in  low back;  42.97 secs 06-28-24 Goal status:  Ongoing  4.  Increase gait velocity to >/= 1.6 ft/sec with rollator for increased gait efficiency with reduced fall risk.          Baseline: 37.38 secs = .88 ft/sec with rollator;  33.44 secs = .98 ft/sec with rollator; 06-28-24 =  36.22, 38.09 secs          Goal status:  ONGOING   6.  Independent in revised/updated HEP for RLE strengthening. Baseline:  Goal status: Revised   7.  Report reduced low back pain at rest to </= 3/10 intensity.            Baseline:  7/10;    04-20-24 -- 5/10;    5/10 = 06-09-24;  06-28-24 = 8/10 intensity            Goal status: Ongoing   ASSESSMENT:   CLINICAL IMPRESSION: Today's PT session focused on assessment of LTG's for recertification for insurance.  Pt has had a setback in progress and also in her functional status due to hospitalization from 8-22 - 06-19-24 due to Covid, and also due to sustaining a fall (during standing) during her hospitalization.  Pt reports increased low back pain and soreness at intensity 8/10 at start of today's session.  Pt has not met any of the initial LTG's due to this fall resulting in increased low back pain/decreased mobility.  Pt's TUG score has decreased from 28.5 secs on 04-19-24 to 43.47 secs with rollator in today's session.  Ambulation distance is limited to approx. 100' with rollator due to c/o low back pain.  Pt will benefit from  additional skilled PT to address low back strengthening, RLE weakness, gait and balance deficits.  Cont with POC.   OBJECTIVE IMPAIRMENTS: Abnormal gait, decreased balance, decreased coordination, decreased endurance, decreased strength, and impaired tone.  ACTIVITY LIMITATIONS: carrying, bending, squatting, stairs, transfers, and locomotion level  PARTICIPATION LIMITATIONS: cleaning, laundry, shopping, and community activity  PERSONAL FACTORS: Behavior pattern, Past/current experiences, and Time since onset of injury/illness/exacerbation are also affecting patient's functional outcome.   REHAB POTENTIAL: Good  CLINICAL DECISION MAKING: Evolving/moderate complexity  EVALUATION COMPLEXITY: Moderate  PLAN:  PT FREQUENCY: 2x/week  PT DURATION: 8 weeks  PLANNED INTERVENTIONS: Therapeutic exercises, Therapeutic activity, Neuromuscular re-education, Balance training, Gait training, Patient/Family education, Self Care, Stair training, Orthotic/Fit training, DME instructions, and Aquatic Therapy  PLAN FOR NEXT SESSION:  Continue with low back stretching; ball exercises    Tylesha Gibeault, [place], PT 06/29/2024, 6:38 PM

## 2024-06-29 ENCOUNTER — Encounter: Payer: Self-pay | Admitting: Physical Therapy

## 2024-06-30 ENCOUNTER — Other Ambulatory Visit: Payer: Self-pay

## 2024-06-30 ENCOUNTER — Ambulatory Visit: Payer: Self-pay | Admitting: Physical Therapy

## 2024-06-30 DIAGNOSIS — M5459 Other low back pain: Secondary | ICD-10-CM

## 2024-06-30 DIAGNOSIS — M6281 Muscle weakness (generalized): Secondary | ICD-10-CM

## 2024-06-30 NOTE — Therapy (Signed)
 OUTPATIENT PHYSICAL THERAPY NEURO TREATMENT NOTE        Patient Name: [name] MRN: [id number] DOB:06-21-1961, 63 y.o., female Today's Date: 06/30/2024   PCP: [place], [name]., MD REFERRING PROVIDER: Babs Arthea DASEN, MD  END OF SESSION:  PT End of Session - 07/03/24 1846     Visit Number 22    Number of Visits 36    Date for PT Re-Evaluation 08/19/24    Authorization Type [employer]    Authorization Time Period 02-16-24 -04-25-24:  04-19-24 - 06-26-24;  06-28-24 - 08-26-24    Progress Note Due on Visit 30    PT Start Time 1404    PT Stop Time 1446    PT Time Calculation (min) 42 min    Activity Tolerance Patient limited by pain    Behavior During Therapy [facility] for tasks assessed/performed                               Past Medical History:  Diagnosis Date   Anxiety    Aortic atherosclerosis (HCC) 04/16/2021   Atypical angina (HCC)    Back pain    related to spinal stenosis and disc problem, radiates down left buttocks to leg., weakness occ.   Chest pain    a. 03/2015 Cath: nl cors; b. 03/2021 Cor CTA: Ca2+ = 0. Nl Cors.   Chronic pain syndrome    Dyspnea    hx   GERD (gastroesophageal reflux disease)    Grade I diastolic dysfunction    Headache    Hyperlipidemia    Hypertension    Lumbar post-laminectomy syndrome    LVH (left ventricular hypertrophy) 12/15/2020   a. 11/2020 Echo: EF 65-70%, no rwma, sev asymm LVH with IVSd 1.9 cm. No LVOT obs @ rest. Gr1 DD. Triv MR.   PONV (postoperative nausea and vomiting)    Pulmonary nodules    a. 03/2021 CT Chest: 3mm pulm nodules in bilat lower lobes. F/u 1 yr.   Right foot drop    Syncope    a. 11/2020 Zio: No significant arrhythmias.   Vaginal foreign object    Uses Femring    Past Surgical History:  Procedure Laterality Date   ABDOMINAL HYSTERECTOMY     ANTERIOR CERVICAL DECOMP/DISCECTOMY FUSION N/A 01/03/2022   Procedure: Anterior Cervical Decompression Fusion - Cervical  four-Cervical five - Cervical five-Cervical six;  Surgeon: Joshua Alm RAMAN, MD;  Location: [facility] OR;  Service: Neurosurgery;  Laterality: N/A;   BIOPSY  12/16/2020   Procedure: BIOPSY;  Surgeon: Aneita Gwendlyn DASEN, MD;  Location: [facility] ENDOSCOPY;  Service: Endoscopy;;   CARDIAC CATHETERIZATION N/A 04/18/2015   Procedure: Left Heart Cath and Coronary Angiography;  Surgeon: Rober Chroman, MD;  Location: [facility] INVASIVE CV LAB;  Service: Cardiovascular;  Laterality: N/A;   COLONOSCOPY     COLONOSCOPY W/ BIOPSIES AND POLYPECTOMY  2018   ESOPHAGOGASTRODUODENOSCOPY (EGD) WITH PROPOFOL  N/A 12/16/2020   Procedure: ESOPHAGOGASTRODUODENOSCOPY (EGD) WITH PROPOFOL ;  Surgeon: Aneita Gwendlyn DASEN, MD;  Location: [facility] ENDOSCOPY;  Service: Endoscopy;  Laterality: N/A;   FOOT SURGERY Bilateral    [facility] bunion,bone spur, tendon (1) -6'16, (1)-10'16   HEMATOMA EVACUATION N/A 01/05/2022   Procedure: Cervical Wound Exploration;  Surgeon: Gillie Duncans, MD;  Location: [facility] OR;  Service: Neurosurgery;  Laterality: N/A;   IR EPIDUROGRAPHY  07/21/2018   LUMBAR LAMINECTOMY/DECOMPRESSION MICRODISCECTOMY Bilateral 12/28/2015   Procedure: MICRO LUMBAR DECOMPRESSION L4 - L5 BILATERALLY;  Surgeon:  Reyes Billing, MD;  Location: [facility] ORS;  Service: Orthopedics;  Laterality: Bilateral;   LUMBAR LAMINECTOMY/DECOMPRESSION MICRODISCECTOMY Bilateral 03/04/2018   Procedure: Revision of Microlumbar Decompression Bilateral Lumbar Four-Five;  Surgeon: Billing Reyes, MD;  Location: [facility] OR;  Service: Orthopedics;  Laterality: Bilateral;  90 mins   LUMBAR WOUND DEBRIDEMENT N/A 12/25/2023   Procedure: Irrigation and debridement of lumbar wound;  Surgeon: Joshua Alm Hamilton, MD;  Location: [facility] OR;  Service: Neurosurgery;  Laterality: N/A;   POSTERIOR FUSION PEDICLE SCREW PLACEMENT N/A 12/07/2023   Procedure: LUMBAR FOUR-FIVE POSTERIOR LATERAL FUSION;  Surgeon: Joshua Alm Hamilton, MD;  Location: [facility] OR;  Service: Neurosurgery;  Laterality: N/A;   SAVORY  DILATION N/A 12/16/2020   Procedure: SAVORY DILATION;  Surgeon: Aneita Gwendlyn DASEN, MD;  Location: [facility] ENDOSCOPY;  Service: Endoscopy;  Laterality: N/A;   SPINAL CORD STIMULATOR INSERTION N/A 09/28/2019   Procedure: THORACIC SPINAL CORD STIMULATOR INSERTION;  Surgeon: Burnetta Aures, MD;  Location: [facility] OR;  Service: Orthopedics;  Laterality: N/A;  2.5 hrs   SPINAL CORD STIMULATOR REMOVAL N/A 05/27/2021   Procedure: LUMBAR SPINAL CORD STIMULATOR REMOVAL;  Surgeon: Bluford Standing, MD;  Location: [facility] ORS;  Service: Neurosurgery;  Laterality: N/A;   TUBAL LIGATION     WISDOM TOOTH EXTRACTION     WOUND EXPLORATION N/A 03/04/2018   Procedure: EXPLORATION OF LUMBAR DECOMPRESSION WOUND;  Surgeon: Billing Reyes, MD;  Location: [facility] OR;  Service: Orthopedics;  Laterality: N/A;   Patient Active Problem List   Diagnosis Date Noted   Vitamin D  deficiency, unspecified 04/13/2024   Seasonal allergic rhinitis due to pollen 01/29/2024   Delayed wound healing 12/25/2023   S/P lumbar fusion 12/07/2023   Pain in thumb joint with movement of right hand 08/12/2023   Localized primary osteoarthritis of carpometacarpal joint of right thumb 07/29/2023   Right foot drop 07/29/2023   Frequent falls 04/14/2023   Gastroesophageal reflux disease 04/14/2023   Right upper extremity numbness 09/29/2022   Stage 3a chronic kidney disease (HCC) 09/29/2022   Cervical spondylosis with radiculopathy 01/05/2022   S/P cervical spinal fusion 01/03/2022   Cervical disc disorder with radiculopathy of cervical region 11/27/2021   Uncontrolled pain 05/27/2021   Syncope 05/13/2021   Depression with anxiety 05/13/2021   Chronic diastolic CHF (congestive heart failure) (HCC) 05/13/2021   Chronic back pain 05/13/2021   Chest tightness 05/13/2021   Lower abdominal pain 05/13/2021   Syncope and collapse 01/01/2021   Abnormal echocardiogram 01/01/2021   BPPV (benign paroxysmal positional vertigo), left 12/26/2020   Odynophagia     Dysphagia 12/15/2020   Postural dizziness with presyncope 12/14/2020   Lumbar post-laminectomy syndrome    Chronic pain 09/28/2019   Lumbar spinal stenosis    Radicular pain    Gait disorder    Difficulty in urination 07/13/2018   Back pain 07/12/2018   Hyperlipidemia 04/16/2018   GAD (generalized anxiety disorder) 04/16/2018   AKI (acute kidney injury) (HCC)    Benign essential HTN    Major depression, recurrent (HCC)    Lumbar radiculopathy 03/09/2018   Paraparesis (HCC) 03/09/2018   Essential hypertension    Chronic nonintractable headache    Leukocytosis    Acute blood loss anemia    Postoperative pain    Generalized weakness    Spinal stenosis at L4-L5 level 03/04/2018   Spinal stenosis of lumbar region 12/28/2015   Chest pain 04/16/2015    ONSET DATE: Most recent Surgery date 12-07-23:  Referral date 02-03-24  REFERRING DIAG:  M54.16 (ICD-10-CM) -  Lumbar radiculopathy M96.1 (ICD-10-CM) - Lumbar post-laminectomy syndrome R26.9 (ICD-10-CM) - Gait disorder  THERAPY DIAG:  Other low back pain  Muscle weakness (generalized)  Rationale for Evaluation and Treatment: Rehabilitation  SUBJECTIVE:                                                                                                                                                                                             SUBJECTIVE STATEMENT: Pt reports she is not having as much pain & soreness today that she had at previous PT session on Tuesday this week; continues to use ice at home for pain reduction  Pt accompanied by: self  PERTINENT HISTORY: The patient was admitted on 12/07/2023 &  underwent PLIF L4-5. Hospitalized 12-07-23 - 12-11-23:  The patient was admitted on 12/25/2023 and taken to the operating room where the patient underwent irrigation and debridement of lumbar wound with primary closure and placement of external wound VAC- hospitalized 12-25-23 -  12-26-23  Frequent falls:  Spinal cord stimulator insertion  on 09-28-19; stimulator removal on 05-27-21 with resultant LLE weakness;  decompression and laminectomy on 12/28/15 at L4-L5  with redo decompression surgery on May 9,2019, post-op paraparesis with surgery to remove small hematoma; 07/12/18 fall at work (involved rolling chair) with hospital admission, transfer to CIR with d/c home 08/03/18; HTN, Bil foot surgery; left ventricular hypertrophy, chronic pain syndrome, dyspnea, hypertrophic cardiomyopathy; cervical fusion March 2023;  had carpal tunnel surgery RUE in April 2024:  LVH  PAIN:  Are you having pain? Pt rates pain 6-7/10 in lumbar region - greater pain in lower lumbar region near sacral area- same burning sensation Alleviating Factor:  placing pillow behind back when sitting Aggravating Factors: certain movements    PRECAUTIONS: Fall; No bending/lifting or twisting; pt states she has a reacher  WEIGHT BEARING RESTRICTIONS: No  FALLS: Has patient fallen in last 6 months? Yes. Number of falls 3  LIVING ENVIRONMENT: Lives with: lives with their spouse Lives in: House/apartment Stairs: Yes: External: 5 steps; on right going up Has following equipment at home: Single point cane, Wheelchair (manual), and L[place]strand crutch  PLOF: Independent with basic ADLs, Independent with household mobility with device, Independent with community mobility with device, and Independent with transfers  PATIENT GOALS: improve walking and strength Rt leg  OBJECTIVE:   DIAGNOSTIC FINDINGS: IMPRESSION: 1. Interval anterior discectomy and fusion from C4 through C6 with improved patency of the spinal canal and only mild residual foraminal narrowing. 2. Stable mild spinal stenosis and mild to moderate left foraminal narrowing at C3-4. 3. Stable mild spinal stenosis and mild foraminal narrowing bilaterally at C6-7. Mild  foraminal narrowing bilaterally at C7-T1. 4. No cord deformity or abnormal cord signal.  No acute findings. No recent MRI of lumbar  region  COGNITION: Overall cognitive status: Within functional limits for tasks assessed   SENSATION: WFL  COORDINATION: Decreased RLE due to weakness and spasticity  POSTURE: No Significant postural limitations  LOWER EXTREMITY ROM:   LLE WFL's;  RLE WFL's except for ankle ROM which is minimal due to weakness; Pt has difficulty performing Rt knee extension due to Rt quad weakness and spasticity - turned body slightly to Lt to achieve more knee extension   LOWER EXTREMITY MMT:    MMT Right Eval Left Eval  Hip flexion 3- 3+   Hip extension    Hip abduction    Hip adduction    Hip internal rotation    Hip external rotation    Knee flexion 3+   Knee extension 4- 4  Ankle dorsiflexion 2-   Ankle plantarflexion    Ankle inversion    Ankle eversion    (Blank rows = not tested)  BED MOBILITY:  Modified independent  TRANSFERS: Assistive device utilized: Rollator  Sit to stand: Modified independence Stand to sit: Modified independence   STAIRS:  Not tested due to c/o back pain   GAIT: Gait pattern: decreased step length- Right, decreased stance time- Right, decreased hip/knee flexion- Right, and decreased ankle dorsiflexion- Right Distance walked: 50' Assistive device utilized: with rollator Level of assistance: SBA and CGA Comments: slow gait speed due to c/o back pain and RLE weakness  FUNCTIONAL TESTS:  Sit to stand score - 2 reps - with bil. UE support from chair- 29.91 secs             TUG score: - 49.19 secs with rollator  Gait velocity:  37.38 secs = .88 ft/sec with rollator  PATIENT SURVEYS:  N/A - based on diagnosis of RLE weakness due to chronic lumbar radiculopathy   TODAY'S TREATMENT: 06-30-24                                                                                                                           Gait: Pt using rollator and wearing Blue rocker AFO on RLE - pt modified independent with use of rollator; ambulating very slowly due to  c/o low back pain  Pt amb. Clinic distances with rollator with supervision   TherEx:  Pt performed following exercises in supine position to reduce back pain and to increase strength in low back musc. and ROM of lumbar spine:   Pelvic tilt 10 reps 5 sec hold Pelvic tilt with heel slide - 10 reps RLE:  LLE 10 reps Small range trunk rotation to Lt and Rt sides 10 times each side - approx. 2-3 sec hold Pelvic tilt with hip flexion in hooklying - 5 reps each leg - RLE and LLE Glut squeezes 10 reps - 3 sec hold  Pt declined performing SciFit exercise due to c/o low back pain  after performing above exercises - pt sat in chair -Ice pack applied to low back for pain management x 10     Medbridge:   Access Code: [email or web address] URL: [email or web address] Date: 05/23/2024 Prepared by: Rock Kussmaul  Exercises - Supine Posterior Pelvic Tilt  - 1 x daily - 7 x weekly - 1 sets - 10 reps - 5 sec hold - Supine Heel Slides  - 1 x daily - 7 x weekly - 1 sets - 10 reps - Lifting leg up/down in this position  - 1 x daily - 7 x weekly - 1 sets - 10 reps - Supine Lower Trunk Rotation  - 1 x daily - 7 x weekly - 1 sets - 5 reps - Supine Bridge with Pelvic Floor Contraction  - 1 x daily - 7 x weekly - 1 sets - 5 reps - 3 sec hold   UPDATED HEP: Access Code: [email or web address] URL: [email or web address] Date: 04/27/2024 Prepared by: Rock Kussmaul  Exercises - Seated Knee Extension with Resistance  - 1 x daily - 7 x weekly - 3 sets - 10 reps - TAP UPS - with Left leg   - 1 x daily - 7 x weekly - 3 sets - 10 reps - Standing March with Counter Support  - 1 x daily - 7 x weekly - 3 sets - 10 reps - Forward Step Up with Counter Support  - 1 x daily - 7 x weekly - 3 sets - 10 reps - Alternating Step Forward with Support  - 1 x daily - 7 x weekly - 3 sets - 10 reps   PATIENT EDUCATION: Education details:  Medbridge  Person educated: Patient Education method: Explanation - handout  declined as pt stated she has it from previous OP admission Education comprehension: verbalized understanding  HOME EXERCISE PROGRAM:   GOALS: Goals reviewed with patient? Yes    UPDATED LONG TERM GOALS: Target date: 06-10-24   Amb. 350' with RW with SBA without requiring seated rest period. Baseline: 134' x 1, 87' x 1 rep - 04-19-24;  measure lobby Goal status: Not assessed on 06-28-24 due to c/o increased low back pain  2.  Improve TUG score to </= 22 secs  with use of RW for improved mobility and reduced fall risk. Baseline: 49.19 secs with rollator;  28.5 secs with rollator - 04-19-24;  43.47 secs with rollator - 06-28-24 Goal status:  Ongoing  3.  Perform 5x sit to stand transfers with LUE support only to demo improved strength in RLE IN </= 25 secs to demo improved functional mobility. Baseline: 36.16 secs - 4x with bil. UE support from chair - had to stop due to burning sensation in low back;  42.97 secs 06-28-24 Goal status:  Ongoing  4.  Increase gait velocity to >/= 1.6 ft/sec with rollator for increased gait efficiency with reduced fall risk.          Baseline: 37.38 secs = .88 ft/sec with rollator;  33.44 secs = .98 ft/sec with rollator;  36.22, 38.09 secs          Goal status:  ONGOING   6.  Independent in updated HEP for RLE strengthening. Baseline:  Goal status:  Goal met 06-09-24   7.  Report reduced low back pain at rest to </= 3/10 intensity.            Baseline:  7/10;    04-20-24 -- 5/10;    5/10 = 06-09-24;  06-28-24 = 8/10 intensity  Goal status: Not met 06-09-24; not met 06-28-24   UPDATED SHORT TERM GOALS:  TARGET DATE 07-22-24:  1.  Amb. 230' with RW with SBA without requiring seated rest period.             Baseline: 134' x 1, 87' x 1 rep on 04-19-24;  approx. 100' from lobby to mat table in gym with rollator on 06-28-24     Goal status:  Ongoing  2.  Improve TUG score to </= 30 secs  with use of RW for improved mobility and reduced fall risk. Baseline:  49.19 secs with rollator;  28.5 secs with rollator; 43.47 secs on 06-28-24 Goal status:  Revised  3.  Perform 5x sit to stand from chair with bil. UE support in </= 30 secs to demonstrate improved mobility.     Baseline:  36.16 secs - 4 reps with bil. UE support from chair:  42.97 secs 06-28-24    Goal status:  Ongoing     4.  Pt will report reduced pain in low back to </= 5/10 intensity for increased ease with ADL's and mobility.    Baseline:  5/10 on 04-19-24:  8/10 on 06-28-24    Goal status:  Revised   UPDATED LONG TERM GOALS: Target date: 08-19-24   Amb. 350' with RW with SBA without requiring seated rest period. Baseline: 134' x 1, 87' x 1 rep - 04-19-24;  measure lobby Goal status: Not assessed on 06-28-24 due to c/o increased low back pain; Ongoing goal  2.  Improve TUG score to </= 25 secs  with use of RW for improved mobility and reduced fall risk. Baseline: 49.19 secs with rollator;  28.5 secs with rollator - 04-19-24;  43.47 secs with rollator - 06-28-24 Goal status:  Revised   3.  Perform 5x sit to stand transfers with LUE support only to demo improved strength in RLE IN </= 25 secs to demo improved functional mobility. Baseline: 36.16 secs - 4x with bil. UE support from chair - had to stop due to burning sensation in low back;  42.97 secs 06-28-24 Goal status:  Ongoing  4.  Increase gait velocity to >/= 1.6 ft/sec with rollator for increased gait efficiency with reduced fall risk.          Baseline: 37.38 secs = .88 ft/sec with rollator;  33.44 secs = .98 ft/sec with rollator; 06-28-24 =  36.22, 38.09 secs          Goal status:  ONGOING   6.  Independent in revised/updated HEP for RLE strengthening. Baseline:  Goal status: Revised   7.  Report reduced low back pain at rest to </= 3/10 intensity.            Baseline:  7/10;    04-20-24 -- 5/10;    5/10 = 06-09-24;  06-28-24 = 8/10 intensity            Goal status: Ongoing   ASSESSMENT:   CLINICAL IMPRESSION: Today's PT session  focused on exercises for low back strengthening and stabilization.  Pt reported slightly decreased back pain at 6-7/10 intensity compared to 8/10 pain intensity reported on 06-30-24.  Pt's gait speed with use of rollator noted to be slightly faster and less antalgic gait pattern noted at beginning of session compared to that exhibited in previous session on 06-30-24. Cont with POC.   OBJECTIVE IMPAIRMENTS: Abnormal gait, decreased balance, decreased coordination, decreased endurance, decreased strength, and impaired tone.  ACTIVITY LIMITATIONS: carrying, bending,  squatting, stairs, transfers, and locomotion level  PARTICIPATION LIMITATIONS: cleaning, laundry, shopping, and community activity  PERSONAL FACTORS: Behavior pattern, Past/current experiences, and Time since onset of injury/illness/exacerbation are also affecting patient's functional outcome.   REHAB POTENTIAL: Good  CLINICAL DECISION MAKING: Evolving/moderate complexity  EVALUATION COMPLEXITY: Moderate  PLAN:  PT FREQUENCY: 2x/week  PT DURATION: 8 weeks  PLANNED INTERVENTIONS: Therapeutic exercises, Therapeutic activity, Neuromuscular re-education, Balance training, Gait training, Patient/Family education, Self Care, Stair training, Orthotic/Fit training, DME instructions, and Aquatic Therapy  PLAN FOR NEXT SESSION:  Continue with low back stretching; ball exercises    Irfan Veal, [place], PT 07/03/2024, 6:48 PM

## 2024-07-01 ENCOUNTER — Other Ambulatory Visit (HOSPITAL_COMMUNITY): Payer: Self-pay

## 2024-07-03 ENCOUNTER — Encounter: Payer: Self-pay | Admitting: Physical Therapy

## 2024-07-03 NOTE — Patient Outreach (Signed)
 Complex Care Management   Visit Note  06/30/2024  Name:  [name] MRN: [id number] DOB: 08/12/1961  Situation: Referral received for Complex Care Management related to Chronic back Pain r/t Spinal Stenosis ans Disc problems and Fall Prevention I obtained verbal consent from Patient.  Visit completed with Patient  on the phone  Background:   Past Medical History:  Diagnosis Date   Anxiety    Aortic atherosclerosis (HCC) 04/16/2021   Atypical angina (HCC)    Back pain    related to spinal stenosis and disc problem, radiates down left buttocks to leg., weakness occ.   Chest pain    a. 03/2015 Cath: nl cors; b. 03/2021 Cor CTA: Ca2+ = 0. Nl Cors.   Chronic pain syndrome    Dyspnea    hx   GERD (gastroesophageal reflux disease)    Grade I diastolic dysfunction    Headache    Hyperlipidemia    Hypertension    Lumbar post-laminectomy syndrome    LVH (left ventricular hypertrophy) 12/15/2020   a. 11/2020 Echo: EF 65-70%, no rwma, sev asymm LVH with IVSd 1.9 cm. No LVOT obs @ rest. Gr1 DD. Triv MR.   PONV (postoperative nausea and vomiting)    Pulmonary nodules    a. 03/2021 CT Chest: 3mm pulm nodules in bilat lower lobes. F/u 1 yr.   Right foot drop    Syncope    a. 11/2020 Zio: No significant arrhythmias.   Vaginal foreign object    Uses Femring     Assessment: Patient Reported Symptoms:  Cognitive Cognitive Status: No symptoms reported      Neurological Neurological Review of Symptoms: No symptoms reported    HEENT HEENT Symptoms Reported: No symptoms reported      Cardiovascular Cardiovascular Symptoms Reported: No symptoms reported    Respiratory Respiratory Symptoms Reported: Dry cough Other Respiratory Symptoms: Was hospitalized from 06/17/24 - 06/19/24 for Covid pneumonia Additional Respiratory Details: Home recovering from Covid Respiratory Management Strategies: Adequate rest, Medication therapy, Coping strategies Respiratory Self-Management Outcome:  4 (good)  Endocrine Endocrine Symptoms Reported: No symptoms reported Is patient diabetic?: No    Gastrointestinal Gastrointestinal Symptoms Reported: No symptoms reported      Genitourinary Genitourinary Symptoms Reported: No symptoms reported    Integumentary Integumentary Symptoms Reported: No symptoms reported    Musculoskeletal Musculoskelatal Symptoms Reviewed: Back pain, Muscle pain, Unsteady gait Musculoskeletal Management Strategies: Medical device, Medication therapy, Coping strategies, Adequate rest, Exercise, Routine screening, Activity Musculoskeletal Self-Management Outcome: 4 (good) Musculoskeletal Comment: still seeing PT Elvie Kussmaul for Outpatient PT 2Xs/week Falls in the past year?: No    Psychosocial Psychosocial Symptoms Reported: No symptoms reported          07/03/2024    PHQ2-9 Depression Screening   Little interest or pleasure in doing things    Feeling down, depressed, or hopeless    PHQ-2 - Total Score    Trouble falling or staying asleep, or sleeping too much    Feeling tired or having little energy    Poor appetite or overeating     Feeling bad about yourself - or that you are a failure or have let yourself or your family down    Trouble concentrating on things, such as reading the newspaper or watching television    Moving or speaking so slowly that other people could have noticed.  Or the opposite - being so fidgety or restless that you have been moving around a lot more than usual    Thoughts  that you would be better off dead, or hurting yourself in some way    PHQ2-9 Total Score    If you checked off any problems, how difficult have these problems made it for you to do your work, take care of things at home, or get along with other people    Depression Interventions/Treatment      There were no vitals filed for this visit.  Medications Reviewed Today     Reviewed by Gordy Channing LABOR, RN (Registered Nurse) on 06/30/24 at 1022  Med List  Status: <None>   Medication Order Taking? Sig Documenting Provider Last Dose Status Informant  acetaminophen  (TYLENOL ) 500 MG tablet [id number] Yes Take 500 mg by mouth every 6 (six) hours as needed for headache. [provider]  Active Self, Pharmacy Records  ALPRAZolam  (XANAX ) 0.5 MG tablet [id number] Yes Take 1/2 - 1 tablet by mouth at bedtime as needed for anxiety. [place], [name], MD  Active Self, Pharmacy Records  amitriptyline  (ELAVIL ) 25 MG tablet [id number] Yes Take 1 tablet (25 mg total) by mouth daily.   Active Self, Pharmacy Records  amLODipine  (NORVASC ) 5 MG tablet [id number] Yes Take 1 tablet (5 mg total) by mouth daily. Mona Vinie BROCKS, MD  Active Self, Pharmacy Records  atorvastatin  (LIPITOR) 20 MG tablet [id number] Yes Take 1 tablet (20 mg total) by mouth daily. [place], [name], MD  Active Self, Pharmacy Records  baclofen  (LIORESAL ) 10 MG tablet [id number] Yes Take 1 tablet (10 mg total) by mouth 3 (three) times daily as needed. Babs Arthea DASEN, MD  Active Self, Pharmacy Records           Med Note JERALYN, [place]OR[place] A   Mon Nov 30, 2023  1:44 PM) Pt alternates takes baclofen  and methocarbamol    benzonatate  (TESSALON ) 100 MG capsule [id number] Yes Take 1 capsule (100 mg total) by mouth 3 (three) times daily. Cheryle Page, MD  Active   betamethasone  dipropionate 0.05 % cream [id number] Yes Apply as directed to skin once a day to itchy areas only, avoiding normal skin, as needed.   Active Self, Pharmacy Records  carvedilol  (COREG ) 25 MG tablet [id number] Yes Take 1 tablet (25 mg total) by mouth 2 (two) times daily with a meal. Walker, Caitlin S, NP  Active Self, Pharmacy Records  cholecalciferol  (VITAMIN D3) 25 MCG (1000 UNIT) tablet [id number] Yes Take 1,000 Units by mouth daily. [provider]  Active Self, Pharmacy Records  diphenhydrAMINE  (BENADRYL ) 25 MG tablet [id number] Yes Take 25 mg by mouth daily as needed for itching. [provider]  Active Self, Pharmacy  Records  docusate sodium  (COLACE) 100 MG capsule [id number] Yes Take 100 mg by mouth 2 (two) times daily. [provider]  Active Self, Pharmacy Records  DULoxetine  (CYMBALTA ) 30 MG capsule [id number] Yes Take 1 capsule (30 mg total) by mouth daily. Cheryle Page, MD  Active   Estradiol  Acetate (FEMRING ) 0.05 MG/24HR RING [id number] Yes USE ONE RING VAGINALLY EVERY 3 MONTHS AS DIRECTED   Active Self, Pharmacy Records  gabapentin  (NEURONTIN ) 600 MG tablet [id number] Yes Take 0.5 tablets (300 mg total) by mouth 3 (three) times daily. Cheryle Page, MD  Active   guaiFENesin  (MUCINEX ) 600 MG 12 hr tablet [id number] Yes Take 2 tablets (1,200 mg total) by mouth 2 (two) times daily as needed. Cheryle Page, MD  Active   hydrALAZINE  (APRESOLINE ) 50 MG tablet [id number] Yes Take 1 tablet (50 mg total) by mouth 2 (two) times daily. Cheryle Page, MD  Active  HYDROcodone  bit-homatropine (HYCODAN) 5-1.5 MG/5ML syrup [id number] Yes Take 5 mLs by mouth every 12 (twelve) hours as needed for up to 10 days. [place], [name], MD  Active     Discontinued 04/21/24 1712 (Allergic reaction)            Med Note>> Gordy Channing LABOR, RN   02/26/2024  9:13 AM Itching, Hives, Breathing Problems    meclizine  (ANTIVERT ) 25 MG tablet [id number] Yes Take 1 tablet (25 mg total) by mouth 3 (three) times daily as needed for dizziness. Cheryle Page, MD  Active   methocarbamol  (ROBAXIN ) 500 MG tablet [id number] Yes Take 1 tablet (500 mg total) by mouth 3 (three) times daily as needed for muscle spasm. Babs Arthea DASEN, MD  Active Self, Pharmacy Records  methocarbamol  (ROBAXIN ) 500 MG tablet [id number] Yes Take 1 tablet (500 mg total) by mouth 3 (three) times daily as needed for muscle spasms.   Active   methylPREDNISolone  (MEDROL  DOSEPAK) 4 MG TBPK tablet [id number] Yes Take as directed per package instructions.   Active   mometasone  (NASONEX ) 50 MCG/ACT nasal spray [id number] Yes Place 2 sprays into the nose daily. [place], Betty  G, MD  Active Self, Pharmacy Records    Discontinued 09/05/23 1325 (Patient Preference)            Med Note>> Sheela Duwaine CROME, CPhT   09/05/2023  1:25 PM non longer taking    tapentadol  (NUCYNTA ) 50 MG tablet [id number] Yes Take 1 tablet (50 mg total) by mouth every 6 (six) to 8 (eight) hours as needed. Babs Arthea DASEN, MD  Active Self, Pharmacy Records            Recommendation:   Continue Current Plan of Care  Follow Up Plan:   Telephone follow up appointment date/time:  07/28/2024 at 9:30 am  Teri Diltz A. Gordy RN, BA, [facility], CRRN [place]  [facility] Population Health RN Care Manager Direct Dial: [phone]  Fax: [phone]

## 2024-07-03 NOTE — Patient Instructions (Signed)
 Visit Information  Thank you for taking time to visit with me today. Please don't hesitate to contact me if I can be of assistance to you before our next scheduled telephone appointment.  Our next appointment is by telephone on 07/28/2024 at 9:30 am.  Following is a copy of your care plan:   Goals Addressed             This Visit's Progress    VBCI RN Care Plan       Problems:  Chronic Disease Management support and education needs related to Back Pain  Goal: Over the next 3 months the Patient will demonstrate Improved adherence to prescribed treatment plan for back pain as evidenced by attending outpatient PT as ordered, rate back pain of 5 or less. 02/26/24 televisit w/ RNCM, pain remains higher than desired, at 7 out of pain scale 0-10. On 5/16 televisit w/ RNCM, pain level was much improved at and at desired level of <5, rating pain at 4 out of pain scale 0-10. On 7/7 televisit w/ RNCM, pain level rated at 5-6, has follow up with Dr. Babs planned for 7/9 to further address post surgical back pain. During 7/21 RNCM appt, patient stated post-op pain has improved to desired level of <5, patient also reported that her Orthopedic Surgeon stated that she had extensive & complex back surgery and it was not unusual for full healing to take 8-10 months. Patient continues her twice weekly PT sessions with Physical Therapist Elvie Ipoijb who patient feels has helped tremendously with her post op progress.8/19/ RNCM appt - patient is presently recovering from a severe URI for which she sought tx from her PCP (neg Covid, neg Flu) and is foregoing PT treatment while she rests and recovers.  Interventions:   Pain Interventions: Pain assessment performed Medications reviewed Reviewed provider established plan for pain management Discussed importance of adherence to all scheduled medical appointments Counseled on the importance of reporting any/all new or changed pain symptoms or management  strategies to pain management provider Discussed use of relaxation techniques and/or diversional activities to assist with pain reduction (distraction, imagery, relaxation, massage, acupressure, TENS, heat, and cold application Reviewed with patient prescribed pharmacological and nonpharmacological pain relief strategies Screening for signs and symptoms of depression related to chronic disease state  Assessed social determinant of health barriers  Patient Self-Care Activities:  Attend all scheduled provider appointments Call pharmacy for medication refills 3-7 days in advance of running out of medications Call provider office for new concerns or questions  Take medications as prescribed    Plan:  Telephone follow up appointment with care management team member scheduled for:  October 2 at 9:30am.          VBCI RN Care Plan       Problems:  Chronic Disease Management support and education needs related to Frequent Falls   Goal: Over the next 3 months the Patient will demonstrate ongoing self health care management ability to avoid injury as evidenced by    no falls.   Interventions:   Falls Interventions: Provided written and verbal education re: potential causes of falls and Fall prevention strategies Reviewed medications and discussed potential side effects of medications such as dizziness and frequent urination Assessed for signs and symptoms of orthostatic hypotension  Patient Self-Care Activities:  Take extra care when getting out of bed, sit on the bed for a few minutes to allow blood pressure to stabilize before ambulating. Perform same caution when going from sitting to  standing.  Use hand rails when possible, be aware of surroundings when walking.   Plan:  The patient has been provided with contact information for the care management team and has been advised to call with any health related questions or concerns.  Follow up appointment with RNCM changed to 07/28/24 @ 9:30  am              Patient verbalizes understanding of instructions and care plan provided today and agrees to view in MyChart. Active MyChart status and patient understanding of how to access instructions and care plan via MyChart confirmed with patient.     The patient has been provided with contact information for the care management team and has been advised to call with any health related questions or concerns.   Please call the care guide team at [phone] if you need to cancel or reschedule your appointment.   Please call 1-800-273-TALK (toll free, 24 hour hotline) if you are experiencing a Mental Health or Behavioral Health Crisis or need someone to talk to.  Bellamie Turney A. Gordy RN, BA, [facility], CRRN [place]  [facility] Population Health RN Care Manager Direct Dial: [phone]  Fax: [phone]

## 2024-07-04 ENCOUNTER — Telehealth: Payer: Self-pay | Admitting: *Deleted

## 2024-07-04 NOTE — Telephone Encounter (Signed)
 Copied from CRM #[id number]. Topic: Clinical - Medical Advice >> Jul 04, 2024  9:38 AM Sophia H wrote: Reason for CRM: Patient had covid a few weeks ago, testing negative now but wants to know if it is normal to feel very fatigued and just not well after getting it. Please advise, ty. # [phone]

## 2024-07-05 ENCOUNTER — Other Ambulatory Visit (HOSPITAL_COMMUNITY): Payer: Self-pay

## 2024-07-05 ENCOUNTER — Ambulatory Visit: Payer: Self-pay | Admitting: Physical Therapy

## 2024-07-06 ENCOUNTER — Ambulatory Visit: Payer: Self-pay

## 2024-07-06 NOTE — Telephone Encounter (Signed)
 Pt was triage by a nurse and message was sent to PCP. Please see other encounter.

## 2024-07-06 NOTE — Telephone Encounter (Signed)
 Attempted to reach pt. Left a voicemail to call us back.

## 2024-07-06 NOTE — Telephone Encounter (Signed)
 Patient returned call, per CAL original caller not available. Patient  routed to Nurse Triage. Per KMS extreme fatigue is considered a Red Word and triage is recommended.

## 2024-07-06 NOTE — Telephone Encounter (Signed)
 Patient refused same day, wanted to PCP only. Scheduled for soonest available on 07/08/24.  FYI Only or Action Required?: Action required by provider: update on patient condition.  Patient was last seen in primary care on 06/21/2024 by [place], [name], MD.  Called Nurse Triage reporting Fatigue.  Symptoms began several weeks ago.  Interventions attempted: Rest, hydration, or home remedies.  Symptoms are: gradually worsening.  Triage Disposition: See Physician Within 24 Hours  Patient/caregiver understands and will follow disposition?: No, refuses disposition  Reason for Disposition  [1] MODERATE weakness (e.g., interferes with work, school, normal activities) AND [2] persists > 3 days  Answer Assessment - Initial Assessment Questions Pt tested positive for covid 06/17/24 in the ED. Has been experiencing fatigue since. Has been taking methocarbamol  three times daily. States she is experiencing a nocturnal cough. Patient denies higher acuity questions. ED precautions reviewed, pt verbalized understanding.   1. DESCRIPTION: Describe how you are feeling.     Fatigued  2. SEVERITY: How bad is it?  Can you stand and walk?     Moderate  3. ONSET: When did these symptoms begin? (e.g., hours, days, weeks, months)     06/10/24  4. CAUSE: What do you think is causing the weakness or fatigue? (e.g., not drinking enough fluids, medical problem, trouble sleeping)     Covid  5. NEW MEDICINES:  Have you started on any new medicines recently? (e.g., opioid pain medicines, benzodiazepines, muscle relaxants, antidepressants, antihistamines, neuroleptics, beta blockers)     Methocarbamol   Protocols used: Weakness (Generalized) and Fatigue-A-AH Copied from CRM #[id number]. Topic: Clinical - Red Word Triage >> Jul 06, 2024 11:25 AM Suzen RAMAN wrote: Red Word that prompted transfer to Nurse Triage: extreme fatigue post COVID

## 2024-07-06 NOTE — Telephone Encounter (Signed)
 Has appt 07/08/24. [name]

## 2024-07-07 ENCOUNTER — Other Ambulatory Visit: Payer: Self-pay | Admitting: Family Medicine

## 2024-07-07 ENCOUNTER — Other Ambulatory Visit (HOSPITAL_COMMUNITY): Payer: Self-pay

## 2024-07-08 ENCOUNTER — Encounter: Payer: Self-pay | Admitting: Family Medicine

## 2024-07-08 ENCOUNTER — Other Ambulatory Visit: Payer: Self-pay

## 2024-07-08 ENCOUNTER — Ambulatory Visit: Admitting: Family Medicine

## 2024-07-08 ENCOUNTER — Telehealth: Payer: Self-pay

## 2024-07-08 ENCOUNTER — Other Ambulatory Visit (HOSPITAL_COMMUNITY): Payer: Self-pay

## 2024-07-08 VITALS — BP 130/86 | HR 87 | Temp 98.0°F | Resp 16 | Ht 65.0 in | Wt 163.0 lb

## 2024-07-08 DIAGNOSIS — R5383 Other fatigue: Secondary | ICD-10-CM | POA: Diagnosis not present

## 2024-07-08 DIAGNOSIS — R519 Headache, unspecified: Secondary | ICD-10-CM

## 2024-07-08 DIAGNOSIS — R052 Subacute cough: Secondary | ICD-10-CM

## 2024-07-08 MED ORDER — HYDROCODONE BIT-HOMATROP MBR 5-1.5 MG/5ML PO SOLN
5.0000 mL | Freq: Every evening | ORAL | 0 refills | Status: AC | PRN
  Filled 2024-07-08: qty 50, 10d supply, fill #0

## 2024-07-08 MED ORDER — BENZONATATE 100 MG PO CAPS
100.0000 mg | ORAL_CAPSULE | Freq: Three times a day (TID) | ORAL | 0 refills | Status: DC
  Filled 2024-07-08 (×2): qty 20, 7d supply, fill #0

## 2024-07-08 NOTE — Progress Notes (Unsigned)
 ACUTE VISIT Chief Complaint  Patient presents with   Fatigue    Pt reports she is feeling a bit better after calling in about her sx. She reports she was in hospital for covid and after body feels weak.  Sx going on for 3-4 wks.    Headache    Pt c/o headache around forehead- throbbing. Was really bad when at hospital. Going on 3-4wk. Taking tylenol . Helps.    Cough    Pt c/o ongoing cough. Worsen when talk a lot, at night. Taking benzonatate .     Discussed the use of AI scribe software for clinical note transcription with the patient, who gave verbal consent to proceed.  History of Present Illness Tanganyika Bowlds is a 63 year old female who presents with persistent symptoms following a recent COVID-19 infection.  She experiences significant fatigue, loss of appetite, and a persistent cough following a recent COVID-19 infection. The cough is particularly severe when she talks, leading to uncontrollable episodes. She is taking benzonatate  for the cough, which helps her rest, but her husband avoids sleeping in the same room due to her coughing. She also reports a global headache and experiences nausea but no vomiting.  She describes feeling weak and dizzy, especially upon standing, which she attributes to her ongoing recovery from a spinal fusion surgery. She mentions a fall in the hospital that exacerbated her back pain. She is currently in therapy and is taking muscle relaxants prescribed by her surgeon to help with back pain and muscle relaxation.  She underwent a head CT following her fall, which did not reveal any masses. She is concerned about her ongoing symptoms, including lightheadedness and dizziness, and is cautious about standing due to her history of falls. She is also dealing with nasal congestion, which she describes as not severe.  She is mindful of her hydration due to previous dehydration and expresses concern about her slow recovery and the impact of COVID-19 on  her overall health, including her appetite and energy levels.   Review of Systems  Constitutional:  Positive for activity change, appetite change and fatigue. Negative for chills and fever.  HENT:  Negative for sore throat.   Genitourinary:  Negative for decreased urine volume, dysuria and hematuria.  Musculoskeletal:  Positive for arthralgias and gait problem.  Skin:  Negative for rash.  Neurological:  Negative for syncope and facial asymmetry.  Psychiatric/Behavioral:  Positive for sleep disturbance. Negative for confusion. The patient is nervous/anxious.    See other pertinent positives and negatives in HPI.  Current Outpatient Medications on File Prior to Visit  Medication Sig Dispense Refill   acetaminophen  (TYLENOL ) 500 MG tablet Take 500 mg by mouth every 6 (six) hours as needed for headache.     ALPRAZolam  (XANAX ) 0.5 MG tablet Take 1/2 - 1 tablet by mouth at bedtime as needed for anxiety. 30 tablet 3   amitriptyline  (ELAVIL ) 25 MG tablet Take 1 tablet (25 mg total) by mouth daily. 90 tablet 2   amLODipine  (NORVASC ) 5 MG tablet Take 1 tablet (5 mg total) by mouth daily. 90 tablet 2   atorvastatin  (LIPITOR) 20 MG tablet Take 1 tablet (20 mg total) by mouth daily. 90 tablet 2   baclofen  (LIORESAL ) 10 MG tablet Take 1 tablet (10 mg total) by mouth 3 (three) times daily as needed. 60 each 3   carvedilol  (COREG ) 25 MG tablet Take 1 tablet (25 mg total) by mouth 2 (two) times daily with a meal. 180 tablet  3   cholecalciferol  (VITAMIN D3) 25 MCG (1000 UNIT) tablet Take 1,000 Units by mouth daily.     diphenhydrAMINE  (BENADRYL ) 25 MG tablet Take 25 mg by mouth daily as needed for itching.     DULoxetine  (CYMBALTA ) 30 MG capsule Take 1 capsule (30 mg total) by mouth daily.     Estradiol  Acetate (FEMRING ) 0.05 MG/24HR RING USE ONE RING VAGINALLY EVERY 3 MONTHS AS DIRECTED 1 each 4   gabapentin  (NEURONTIN ) 600 MG tablet Take 0.5 tablets (300 mg total) by mouth 3 (three) times daily.      hydrALAZINE  (APRESOLINE ) 50 MG tablet Take 1 tablet (50 mg total) by mouth 2 (two) times daily.     meclizine  (ANTIVERT ) 25 MG tablet Take 1 tablet (25 mg total) by mouth 3 (three) times daily as needed for dizziness. 30 tablet 0   methocarbamol  (ROBAXIN ) 500 MG tablet Take 1 tablet (500 mg total) by mouth 3 (three) times daily as needed for muscle spasm. 30 tablet 6   methocarbamol  (ROBAXIN ) 500 MG tablet Take 1 tablet (500 mg total) by mouth 3 (three) times daily as needed for muscle spasms. 30 tablet 1   tapentadol  (NUCYNTA ) 50 MG tablet Take 1 tablet (50 mg total) by mouth every 6 (six) to 8 (eight) hours as needed. 60 tablet 0   betamethasone  dipropionate 0.05 % cream Apply as directed to skin once a day to itchy areas only, avoiding normal skin, as needed. 45 g 0   docusate sodium  (COLACE) 100 MG capsule Take 100 mg by mouth 2 (two) times daily. (Patient not taking: Reported on 07/08/2024)     guaiFENesin  (MUCINEX ) 600 MG 12 hr tablet Take 2 tablets (1,200 mg total) by mouth 2 (two) times daily as needed. (Patient not taking: Reported on 07/08/2024) 30 tablet 0   methylPREDNISolone  (MEDROL  DOSEPAK) 4 MG TBPK tablet Take as directed per package instructions. 21 tablet 0   mometasone  (NASONEX ) 50 MCG/ACT nasal spray Place 2 sprays into the nose daily. (Patient not taking: Reported on 07/08/2024) 17 g 3   [DISCONTINUED] levocetirizine (XYZAL  ALLERGY 24HR) 5 MG tablet Take 1 tablet (5 mg total) by mouth every evening. 90 tablet 0   [DISCONTINUED] omeprazole  (PRILOSEC) 40 MG capsule Take 1 capsule (40 mg total) by mouth daily. 90 capsule 1   No current facility-administered medications on file prior to visit.    Past Medical History:  Diagnosis Date   Anxiety    Aortic atherosclerosis (HCC) 04/16/2021   Atypical angina (HCC)    Back pain    related to spinal stenosis and disc problem, radiates down left buttocks to leg., weakness occ.   Chest pain    a. 03/2015 Cath: nl cors; b. 03/2021 Cor CTA:  Ca2+ = 0. Nl Cors.   Chronic pain syndrome    Dyspnea    hx   GERD (gastroesophageal reflux disease)    Grade I diastolic dysfunction    Headache    Hyperlipidemia    Hypertension    Lumbar post-laminectomy syndrome    LVH (left ventricular hypertrophy) 12/15/2020   a. 11/2020 Echo: EF 65-70%, no rwma, sev asymm LVH with IVSd 1.9 cm. No LVOT obs @ rest. Gr1 DD. Triv MR.   PONV (postoperative nausea and vomiting)    Pulmonary nodules    a. 03/2021 CT Chest: 3mm pulm nodules in bilat lower lobes. F/u 1 yr.   Right foot drop    Syncope    a. 11/2020 [facility]: No significant arrhythmias.  Vaginal foreign object    Uses Femring    Allergies  Allergen Reactions   Cephalosporins Anaphylaxis   Penicillins Anaphylaxis and Hives   Anesthetics, Amide Nausea And Vomiting    Does not know name of it. States they put it on record foot center.    Betadine [Povidone Iodine] Other (See Comments)    Skin Burn caused scar on Left buttock   Latex Hives, Itching, Rash and Other (See Comments)   Peach [Prunus Persica] Hives   Xyzal  [Levocetirizine] Itching    Patient took Xyzal  for the first time to see if med would help her seasonal rhinitis d/t increased pollen - patient stated that she experienced intense itching over her entire body   Claritin [Loratadine]     pruritis    Hydrocodone      Altered mental state, feel spaced out     Social History   Socioeconomic History   Marital status: Married    Spouse name: Rayne Sprang   Number of children: 1   Years of education: Not on file   Highest education level: Not on file  Occupational History   Not on file  Tobacco Use   Smoking status: Never   Smokeless tobacco: Never  Vaping Use   Vaping status: Never Used  Substance and Sexual Activity   Alcohol use: Never   Drug use: Never   Sexual activity: Yes    Partners: Male    Birth control/protection: Post-menopausal, Surgical    Comment: Hysterectomy  Other Topics Concern   Not on file   Social History Narrative   Not on file   Social Drivers of Health   Financial Resource Strain: Low Risk  (05/02/2024)   Overall Financial Resource Strain (CARDIA)    Difficulty of Paying Living Expenses: Not very hard  Food Insecurity: No Food Insecurity (06/17/2024)   Hunger Vital Sign    Worried About Running Out of Food in the Last Year: Never true    Ran Out of Food in the Last Year: Never true  Transportation Needs: No Transportation Needs (06/17/2024)   PRAPARE - Transportation    Lack of Transportation (Medical): No    Lack of Transportation (Non-Medical): No  Physical Activity: Insufficiently Active (05/02/2024)   Exercise Vital Sign    Days of Exercise per Week: 2 days    Minutes of Exercise per Session: 30 min  Stress: No Stress Concern Present (10/02/2023)   [employer] of Occupational Health - Occupational Stress Questionnaire    Feeling of Stress : Not at all  Social Connections: Socially Integrated (06/14/2024)   Social Connection and Isolation Panel    Frequency of Communication with Friends and Family: More than three times a week    Frequency of Social Gatherings with Friends and Family: More than three times a week    Attends Religious Services: More than 4 times per year    Active Member of [employer] or Organizations: Yes    Attends Banker Meetings: More than 4 times per year    Marital Status: Married    Vitals:   07/08/24 1158  BP: 130/86  Pulse: 87  Resp: 16  Temp: 98 F (36.7 C)  SpO2: 98%   Body mass index is 27.12 kg/m.  Physical Exam Vitals and nursing note reviewed.  Constitutional:      General: She is not in acute distress.    Appearance: She is well-developed. She is not ill-appearing.  HENT:     Head: Normocephalic and atraumatic.  Eyes:     Conjunctiva/sclera: Conjunctivae normal.  Cardiovascular:     Rate and Rhythm: Normal rate and regular rhythm.     Heart sounds: No murmur heard. Pulmonary:     Effort: Pulmonary  effort is normal. No respiratory distress.     Breath sounds: Normal breath sounds. No stridor.  Musculoskeletal:     Cervical back: No edema or erythema. No muscular tenderness.  Lymphadenopathy:     Cervical: No cervical adenopathy.  Skin:    General: Skin is warm.     Findings: No erythema or rash.  Neurological:     General: No focal deficit present.     Mental Status: She is alert and oriented to person, place, and time.     Comments: Unstable , antalgic assisted by a cane. Right foot drop with hard brace  Psychiatric:        Mood and Affect: Affect normal. Mood is anxious.   ASSESSMENT AND PLAN:  Fatigue, unspecified type  Headache, unspecified headache type  Subacute cough -     Benzonatate ; Take 1 capsule (100 mg total) by mouth 3 (three) times daily.  Dispense: 20 capsule; Refill: 0 -     HYDROcodone  Bit-Homatrop MBr; Take 5 mLs by mouth at bedtime as needed for up to 10 days.  Dispense: 50 mL; Refill: 0  Later she called requesting cough suryp. We discussed here in the office the risk of interaction with her Nucynta , which was also discussed last visit when Hycodan was prescribed. Hycodan Rx sent to take at bedtime as needed.***  Return if symptoms worsen or fail to improve, for keep next appointment.  Abraham Entwistle G. [place], MD  [facility]. [place] office.

## 2024-07-08 NOTE — Telephone Encounter (Signed)
 Sorry for the confusion, I explained that the cough syrup can interact with the pain medication she is currently taking. I thought she meant that Benzonatate  was helping. I can send another course of Hycodan to take at bedtime for 10 days.  Thanks, BJ

## 2024-07-08 NOTE — Telephone Encounter (Signed)
 Copied from CRM #[id number]. Topic: Clinical - Prescription Issue >> Jul 08, 2024  1:30 PM Macario HERO wrote: Reason for CRM: Patient called said when she came into the office today she told Dr. [place] she wanted cough syrup for the cough she has and cannot sleep. She said when she got to the pharmacy they said her provider sent pills instead. - Patient is requesting a call back.

## 2024-07-08 NOTE — Patient Instructions (Addendum)
 A few things to remember from today's visit:  Headache, unspecified headache type  Subacute cough Monitor for new symptoms. It may take some times to go to your baseline. Continue cough medication.  If you need refills for medications you take chronically, please call your pharmacy. Do not use My Chart to request refills or for acute issues that need immediate attention. If you send a my chart message, it may take a few days to be addressed, specially if I am not in the office.  Please be sure medication list is accurate. If a new problem present, please set up appointment sooner than planned today.

## 2024-07-09 ENCOUNTER — Other Ambulatory Visit (HOSPITAL_COMMUNITY): Payer: Self-pay

## 2024-07-11 ENCOUNTER — Other Ambulatory Visit (HOSPITAL_COMMUNITY): Payer: Self-pay

## 2024-07-11 ENCOUNTER — Other Ambulatory Visit: Payer: Self-pay | Admitting: Physical Medicine & Rehabilitation

## 2024-07-11 ENCOUNTER — Other Ambulatory Visit (HOSPITAL_BASED_OUTPATIENT_CLINIC_OR_DEPARTMENT_OTHER): Payer: Self-pay | Admitting: Internal Medicine

## 2024-07-11 NOTE — Telephone Encounter (Signed)
 Follow up with pt. Pt reports she did pick up her Rx and taking the Rx. Pt reports she is still having uncontrollable cough, headache- that is pounding on fore head. Taking Tylenol  2-3x a day for headache. Pt continues she doesn't want to continue taking tylenol  like that. She said the headache is probably from her cough.   Pt brought up she has cough spell when she talk for period of time and laying flat on bed. Pt reports she is eating and drinking. But still feel her energy is zapped. She continued Im just miserable. Had to hang out phone with sister due to cough spell.  No energy to entertain her grandkid when they come over. Pt became emotional when she was sharing and was frustrated.   Pt updates that she had chest pain last night and while on phone with this cma. Pt also reports she was sweating this morning. Pt shared her husband advise to go to ER. Pt declined. Pt denied sob, blurred vision. Inform pt because she has sx chest pain, headache, sweating, advise her to go to ER. Pt declined and stated she doesn't want to go waiting in the ER. Would rather rest and try to sleep.  Advise pt to go to nearest ER or call 911 if her sx persisting, worse with SOB, blurred, nausea.   Provider is aware.

## 2024-07-12 ENCOUNTER — Other Ambulatory Visit (HOSPITAL_COMMUNITY): Payer: Self-pay

## 2024-07-12 ENCOUNTER — Ambulatory Visit: Payer: Self-pay | Admitting: Physical Therapy

## 2024-07-12 ENCOUNTER — Other Ambulatory Visit: Payer: Self-pay

## 2024-07-12 DIAGNOSIS — M5459 Other low back pain: Secondary | ICD-10-CM | POA: Diagnosis not present

## 2024-07-12 DIAGNOSIS — M6281 Muscle weakness (generalized): Secondary | ICD-10-CM

## 2024-07-12 NOTE — Therapy (Unsigned)
 OUTPATIENT PHYSICAL THERAPY NEURO TREATMENT NOTE        Patient Name: [name] MRN: [id number] DOB:1961/07/22, 63 y.o., female Today's Date: 06/30/2024   PCP: [place], [name]., MD REFERRING PROVIDER: Babs Arthea DASEN, MD  END OF SESSION:                         Past Medical History:  Diagnosis Date   Anxiety    Aortic atherosclerosis (HCC) 04/16/2021   Atypical angina (HCC)    Back pain    related to spinal stenosis and disc problem, radiates down left buttocks to leg., weakness occ.   Chest pain    a. 03/2015 Cath: nl cors; b. 03/2021 Cor CTA: Ca2+ = 0. Nl Cors.   Chronic pain syndrome    Dyspnea    hx   GERD (gastroesophageal reflux disease)    Grade I diastolic dysfunction    Headache    Hyperlipidemia    Hypertension    Lumbar post-laminectomy syndrome    LVH (left ventricular hypertrophy) 12/15/2020   a. 11/2020 Echo: EF 65-70%, no rwma, sev asymm LVH with IVSd 1.9 cm. No LVOT obs @ rest. Gr1 DD. Triv MR.   PONV (postoperative nausea and vomiting)    Pulmonary nodules    a. 03/2021 CT Chest: 3mm pulm nodules in bilat lower lobes. F/u 1 yr.   Right foot drop    Syncope    a. 11/2020 Zio: No significant arrhythmias.   Vaginal foreign object    Uses Femring    Past Surgical History:  Procedure Laterality Date   ABDOMINAL HYSTERECTOMY     ANTERIOR CERVICAL DECOMP/DISCECTOMY FUSION N/A 01/03/2022   Procedure: Anterior Cervical Decompression Fusion - Cervical four-Cervical five - Cervical five-Cervical six;  Surgeon: Joshua Alm RAMAN, MD;  Location: [facility] OR;  Service: Neurosurgery;  Laterality: N/A;   BIOPSY  12/16/2020   Procedure: BIOPSY;  Surgeon: Aneita Gwendlyn DASEN, MD;  Location: [facility] ENDOSCOPY;  Service: Endoscopy;;   CARDIAC CATHETERIZATION N/A 04/18/2015   Procedure: Left Heart Cath and Coronary Angiography;  Surgeon: Rober Chroman, MD;  Location: [facility] INVASIVE CV LAB;  Service: Cardiovascular;  Laterality: N/A;   COLONOSCOPY      COLONOSCOPY W/ BIOPSIES AND POLYPECTOMY  2018   ESOPHAGOGASTRODUODENOSCOPY (EGD) WITH PROPOFOL  N/A 12/16/2020   Procedure: ESOPHAGOGASTRODUODENOSCOPY (EGD) WITH PROPOFOL ;  Surgeon: Aneita Gwendlyn DASEN, MD;  Location: [facility] ENDOSCOPY;  Service: Endoscopy;  Laterality: N/A;   FOOT SURGERY Bilateral    [facility] bunion,bone spur, tendon (1) -6'16, (1)-10'16   HEMATOMA EVACUATION N/A 01/05/2022   Procedure: Cervical Wound Exploration;  Surgeon: Gillie Duncans, MD;  Location: [facility] OR;  Service: Neurosurgery;  Laterality: N/A;   IR EPIDUROGRAPHY  07/21/2018   LUMBAR LAMINECTOMY/DECOMPRESSION MICRODISCECTOMY Bilateral 12/28/2015   Procedure: MICRO LUMBAR DECOMPRESSION L4 - L5 BILATERALLY;  Surgeon: Reyes Billing, MD;  Location: [facility] ORS;  Service: Orthopedics;  Laterality: Bilateral;   LUMBAR LAMINECTOMY/DECOMPRESSION MICRODISCECTOMY Bilateral 03/04/2018   Procedure: Revision of Microlumbar Decompression Bilateral Lumbar Four-Five;  Surgeon: Billing Reyes, MD;  Location: [facility] OR;  Service: Orthopedics;  Laterality: Bilateral;  90 mins   LUMBAR WOUND DEBRIDEMENT N/A 12/25/2023   Procedure: Irrigation and debridement of lumbar wound;  Surgeon: Joshua Alm Hamilton, MD;  Location: [facility] OR;  Service: Neurosurgery;  Laterality: N/A;   POSTERIOR FUSION PEDICLE SCREW PLACEMENT N/A 12/07/2023   Procedure: LUMBAR FOUR-FIVE POSTERIOR LATERAL FUSION;  Surgeon: Joshua Alm Hamilton, MD;  Location: [facility] OR;  Service: Neurosurgery;  Laterality: N/A;   SAVORY DILATION N/A 12/16/2020   Procedure: SAVORY DILATION;  Surgeon: Aneita Gwendlyn DASEN, MD;  Location: [facility] ENDOSCOPY;  Service: Endoscopy;  Laterality: N/A;   SPINAL CORD STIMULATOR INSERTION N/A 09/28/2019   Procedure: THORACIC SPINAL CORD STIMULATOR INSERTION;  Surgeon: Burnetta Aures, MD;  Location: [facility] OR;  Service: Orthopedics;  Laterality: N/A;  2.5 hrs   SPINAL CORD STIMULATOR REMOVAL N/A 05/27/2021   Procedure: LUMBAR SPINAL CORD STIMULATOR REMOVAL;  Surgeon: Bluford Standing, MD;  Location: [facility] ORS;  Service: Neurosurgery;  Laterality: N/A;   TUBAL LIGATION     WISDOM TOOTH EXTRACTION     WOUND EXPLORATION N/A 03/04/2018   Procedure: EXPLORATION OF LUMBAR DECOMPRESSION WOUND;  Surgeon: Duwayne Purchase, MD;  Location: [facility] OR;  Service: Orthopedics;  Laterality: N/A;   Patient Active Problem List   Diagnosis Date Noted   Vitamin D  deficiency, unspecified 04/13/2024   Seasonal allergic rhinitis due to pollen 01/29/2024   Delayed wound healing 12/25/2023   S/P lumbar fusion 12/07/2023   Pain in thumb joint with movement of right hand 08/12/2023   Localized primary osteoarthritis of carpometacarpal joint of right thumb 07/29/2023   Right foot drop 07/29/2023   Frequent falls 04/14/2023   Gastroesophageal reflux disease 04/14/2023   Right upper extremity numbness 09/29/2022   Stage 3a chronic kidney disease (HCC) 09/29/2022   Cervical spondylosis with radiculopathy 01/05/2022   S/P cervical spinal fusion 01/03/2022   Cervical disc disorder with radiculopathy of cervical region 11/27/2021   Uncontrolled pain 05/27/2021   Syncope 05/13/2021   Depression with anxiety 05/13/2021   Chronic diastolic CHF (congestive heart failure) (HCC) 05/13/2021   Chronic back pain 05/13/2021   Chest tightness 05/13/2021   Lower abdominal pain 05/13/2021   Syncope and collapse 01/01/2021   Abnormal echocardiogram 01/01/2021   BPPV (benign paroxysmal positional vertigo), left 12/26/2020   Odynophagia    Dysphagia 12/15/2020   Postural dizziness with presyncope 12/14/2020   Lumbar post-laminectomy syndrome    Chronic pain 09/28/2019   Lumbar spinal stenosis    Radicular pain    Gait disorder    Difficulty in urination 07/13/2018   Back pain 07/12/2018   Hyperlipidemia 04/16/2018   GAD (generalized anxiety disorder) 04/16/2018   AKI (acute kidney injury) (HCC)    Benign essential HTN    Major depression, recurrent (HCC)    Lumbar radiculopathy 03/09/2018    Paraparesis (HCC) 03/09/2018   Essential hypertension    Chronic nonintractable headache    Leukocytosis    Acute blood loss anemia    Postoperative pain    Generalized weakness    Spinal stenosis at L4-L5 level 03/04/2018   Spinal stenosis of lumbar region 12/28/2015   Chest pain 04/16/2015    ONSET DATE: Most recent Surgery date 12-07-23:  Referral date 02-03-24  REFERRING DIAG:  M54.16 (ICD-10-CM) - Lumbar radiculopathy M96.1 (ICD-10-CM) - Lumbar post-laminectomy syndrome R26.9 (ICD-10-CM) - Gait disorder  THERAPY DIAG:  No diagnosis found.  Rationale for Evaluation and Treatment: Rehabilitation  SUBJECTIVE:  SUBJECTIVE STATEMENT: Pt reports she is not having as much pain & soreness today that she had at previous PT session on Tuesday this week; continues to use ice at home for pain reduction  Pt accompanied by: self  PERTINENT HISTORY: The patient was admitted on 12/07/2023 &  underwent PLIF L4-5. Hospitalized 12-07-23 - 12-11-23:  The patient was admitted on 12/25/2023 and taken to the operating room where the patient underwent irrigation and debridement of lumbar wound with primary closure and placement of external wound VAC- hospitalized 12-25-23 -  12-26-23  Frequent falls:  Spinal cord stimulator insertion on 09-28-19; stimulator removal on 05-27-21 with resultant LLE weakness;  decompression and laminectomy on 12/28/15 at L4-L5  with redo decompression surgery on May 9,2019, post-op paraparesis with surgery to remove small hematoma; 07/12/18 fall at work (involved rolling chair) with hospital admission, transfer to CIR with d/c home 08/03/18; HTN, Bil foot surgery; left ventricular hypertrophy, chronic pain syndrome, dyspnea, hypertrophic cardiomyopathy; cervical fusion March 2023;  had carpal tunnel surgery  RUE in April 2024:  LVH  PAIN:  Are you having pain? Pt rates pain 6/10 in lumbar region - greater pain in lower lumbar region near sacral area- same burning sensation Alleviating Factor:  placing pillow behind back when sitting Aggravating Factors: certain movements    PRECAUTIONS: Fall; No bending/lifting or twisting; pt states she has a reacher  WEIGHT BEARING RESTRICTIONS: No  FALLS: Has patient fallen in last 6 months? Yes. Number of falls 3  LIVING ENVIRONMENT: Lives with: lives with their spouse Lives in: House/apartment Stairs: Yes: External: 5 steps; on right going up Has following equipment at home: Single point cane, Wheelchair (manual), and [place]strand crutch  PLOF: Independent with basic ADLs, Independent with household mobility with device, Independent with community mobility with device, and Independent with transfers  PATIENT GOALS: improve walking and strength Rt leg  OBJECTIVE:   DIAGNOSTIC FINDINGS: IMPRESSION: 1. Interval anterior discectomy and fusion from C4 through C6 with improved patency of the spinal canal and only mild residual foraminal narrowing. 2. Stable mild spinal stenosis and mild to moderate left foraminal narrowing at C3-4. 3. Stable mild spinal stenosis and mild foraminal narrowing bilaterally at C6-7. Mild foraminal narrowing bilaterally at C7-T1. 4. No cord deformity or abnormal cord signal.  No acute findings. No recent MRI of lumbar region  COGNITION: Overall cognitive status: Within functional limits for tasks assessed   SENSATION: WFL  COORDINATION: Decreased RLE due to weakness and spasticity  POSTURE: No Significant postural limitations  LOWER EXTREMITY ROM:   LLE WFL's;  RLE WFL's except for ankle ROM which is minimal due to weakness; Pt has difficulty performing Rt knee extension due to Rt quad weakness and spasticity - turned body slightly to Lt to achieve more knee extension   LOWER EXTREMITY MMT:    MMT Right Eval  Left Eval  Hip flexion 3- 3+   Hip extension    Hip abduction    Hip adduction    Hip internal rotation    Hip external rotation    Knee flexion 3+   Knee extension 4- 4  Ankle dorsiflexion 2-   Ankle plantarflexion    Ankle inversion    Ankle eversion    (Blank rows = not tested)  BED MOBILITY:  Modified independent  TRANSFERS: Assistive device utilized: Rollator  Sit to stand: Modified independence Stand to sit: Modified independence   STAIRS:  Not tested due to c/o back pain   GAIT: Gait pattern: decreased  step length- Right, decreased stance time- Right, decreased hip/knee flexion- Right, and decreased ankle dorsiflexion- Right Distance walked: 50' Assistive device utilized: with rollator Level of assistance: SBA and CGA Comments: slow gait speed due to c/o back pain and RLE weakness  FUNCTIONAL TESTS:  Sit to stand score - 2 reps - with bil. UE support from chair- 29.91 secs             TUG score: - 49.19 secs with rollator  Gait velocity:  37.38 secs = .88 ft/sec with rollator  PATIENT SURVEYS:  N/A - based on diagnosis of RLE weakness due to chronic lumbar radiculopathy   TODAY'S TREATMENT: 07-12-24                                                                                                                           Gait: Pt using rollator and wearing Blue rocker AFO on RLE - pt modified independent with use of rollator; ambulating very slowly due to c/o low back pain  Pt amb. Clinic distances with rollator with supervision   TherEx:  Pt performed following exercises in supine position to reduce back pain and to increase strength in low back musc. and ROM of lumbar spine:   Pelvic tilt 10 reps 5 sec hold Pelvic tilt with heel slide - 10 reps RLE:  LLE 10 reps Small range trunk rotation to Lt and Rt sides 10 times each side - approx. 2-3 sec hold Pelvic tilt with hip flexion in hooklying - 5 reps each leg - RLE and LLE Glut squeezes 10 reps - 3 sec  hold  Pt declined performing SciFit exercise due to c/o low back pain after performing above exercises - pt sat in chair -Ice pack applied to low back for pain management x 10 ===================  Pelvic tilt 10 reps 5 sec hold Pelvic tilt with RLE heel slide 10 reps:  LLE 10 reps Pelvic tilt with hip flexion in hooklying - 10 reps each leg - RLE and LLE Glut squeezes 10 reps - 3 sec hold Small range trunk rotation to Lt and Rt sides 10 times each side - approx. 2-3 sec hold  Medbridge:   Access Code: [email or web address] URL: [email or web address] Date: 05/23/2024 Prepared by: Rock Kussmaul  Exercises - Supine Posterior Pelvic Tilt  - 1 x daily - 7 x weekly - 1 sets - 10 reps - 5 sec hold - Supine Heel Slides  - 1 x daily - 7 x weekly - 1 sets - 10 reps - Lifting leg up/down in this position  - 1 x daily - 7 x weekly - 1 sets - 10 reps - Supine Lower Trunk Rotation  - 1 x daily - 7 x weekly - 1 sets - 5 reps - Supine Bridge with Pelvic Floor Contraction  - 1 x daily - 7 x weekly - 1 sets - 5 reps - 3 sec hold   UPDATED HEP: Access Code: [email or web address] URL: [email or web address] Date: 04/27/2024  Prepared by: Rock Kussmaul  Exercises - Seated Knee Extension with Resistance  - 1 x daily - 7 x weekly - 3 sets - 10 reps - TAP UPS - with Left leg   - 1 x daily - 7 x weekly - 3 sets - 10 reps - Standing March with Counter Support  - 1 x daily - 7 x weekly - 3 sets - 10 reps - Forward Step Up with Counter Support  - 1 x daily - 7 x weekly - 3 sets - 10 reps - Alternating Step Forward with Support  - 1 x daily - 7 x weekly - 3 sets - 10 reps   PATIENT EDUCATION: Education details:  Medbridge  Person educated: Patient Education method: Explanation - handout declined as pt stated she has it from previous OP admission Education comprehension: verbalized understanding  HOME EXERCISE PROGRAM:   GOALS: Goals reviewed with patient? Yes    UPDATED LONG TERM GOALS: Target  date: 06-10-24   Amb. 350' with RW with SBA without requiring seated rest period. Baseline: 134' x 1, 87' x 1 rep - 04-19-24;  measure lobby Goal status: Not assessed on 06-28-24 due to c/o increased low back pain  2.  Improve TUG score to </= 22 secs  with use of RW for improved mobility and reduced fall risk. Baseline: 49.19 secs with rollator;  28.5 secs with rollator - 04-19-24;  43.47 secs with rollator - 06-28-24 Goal status:  Ongoing  3.  Perform 5x sit to stand transfers with LUE support only to demo improved strength in RLE IN </= 25 secs to demo improved functional mobility. Baseline: 36.16 secs - 4x with bil. UE support from chair - had to stop due to burning sensation in low back;  42.97 secs 06-28-24 Goal status:  Ongoing  4.  Increase gait velocity to >/= 1.6 ft/sec with rollator for increased gait efficiency with reduced fall risk.          Baseline: 37.38 secs = .88 ft/sec with rollator;  33.44 secs = .98 ft/sec with rollator;  36.22, 38.09 secs          Goal status:  ONGOING   6.  Independent in updated HEP for RLE strengthening. Baseline:  Goal status:  Goal met 06-09-24   7.  Report reduced low back pain at rest to </= 3/10 intensity.            Baseline:  7/10;    04-20-24 -- 5/10;    5/10 = 06-09-24;  06-28-24 = 8/10 intensity            Goal status: Not met 06-09-24; not met 06-28-24   UPDATED SHORT TERM GOALS:  TARGET DATE 07-22-24:  1.  Amb. 230' with RW with SBA without requiring seated rest period.             Baseline: 134' x 1, 87' x 1 rep on 04-19-24;  approx. 100' from lobby to mat table in gym with rollator on 06-28-24     Goal status:  Ongoing  2.  Improve TUG score to </= 30 secs  with use of RW for improved mobility and reduced fall risk. Baseline: 49.19 secs with rollator;  28.5 secs with rollator; 43.47 secs on 06-28-24 Goal status:  Revised  3.  Perform 5x sit to stand from chair with bil. UE support in </= 30 secs to demonstrate improved mobility.     Baseline:   36.16 secs - 4 reps with  bil. UE support from chair:  42.97 secs 06-28-24    Goal status:  Ongoing     4.  Pt will report reduced pain in low back to </= 5/10 intensity for increased ease with ADL's and mobility.    Baseline:  5/10 on 04-19-24:  8/10 on 06-28-24    Goal status:  Revised   UPDATED LONG TERM GOALS: Target date: 08-19-24   Amb. 350' with RW with SBA without requiring seated rest period. Baseline: 134' x 1, 87' x 1 rep - 04-19-24;  measure lobby Goal status: Not assessed on 06-28-24 due to c/o increased low back pain; Ongoing goal  2.  Improve TUG score to </= 25 secs  with use of RW for improved mobility and reduced fall risk. Baseline: 49.19 secs with rollator;  28.5 secs with rollator - 04-19-24;  43.47 secs with rollator - 06-28-24 Goal status:  Revised   3.  Perform 5x sit to stand transfers with LUE support only to demo improved strength in RLE IN </= 25 secs to demo improved functional mobility. Baseline: 36.16 secs - 4x with bil. UE support from chair - had to stop due to burning sensation in low back;  42.97 secs 06-28-24 Goal status:  Ongoing  4.  Increase gait velocity to >/= 1.6 ft/sec with rollator for increased gait efficiency with reduced fall risk.          Baseline: 37.38 secs = .88 ft/sec with rollator;  33.44 secs = .98 ft/sec with rollator; 06-28-24 =  36.22, 38.09 secs          Goal status:  ONGOING   6.  Independent in revised/updated HEP for RLE strengthening. Baseline:  Goal status: Revised   7.  Report reduced low back pain at rest to </= 3/10 intensity.            Baseline:  7/10;    04-20-24 -- 5/10;    5/10 = 06-09-24;  06-28-24 = 8/10 intensity            Goal status: Ongoing   ASSESSMENT:   CLINICAL IMPRESSION: Today's PT session focused on exercises for low back strengthening and stabilization.  Pt reported slightly decreased back pain at 6-7/10 intensity compared to 8/10 pain intensity reported on 06-30-24.  Pt's gait speed with use of rollator noted to  be slightly faster and less antalgic gait pattern noted at beginning of session compared to that exhibited in previous session on 06-30-24. Cont with POC.   OBJECTIVE IMPAIRMENTS: Abnormal gait, decreased balance, decreased coordination, decreased endurance, decreased strength, and impaired tone.  ACTIVITY LIMITATIONS: carrying, bending, squatting, stairs, transfers, and locomotion level  PARTICIPATION LIMITATIONS: cleaning, laundry, shopping, and community activity  PERSONAL FACTORS: Behavior pattern, Past/current experiences, and Time since onset of injury/illness/exacerbation are also affecting patient's functional outcome.   REHAB POTENTIAL: Good  CLINICAL DECISION MAKING: Evolving/moderate complexity  EVALUATION COMPLEXITY: Moderate  PLAN:  PT FREQUENCY: 2x/week  PT DURATION: 8 weeks  PLANNED INTERVENTIONS: Therapeutic exercises, Therapeutic activity, Neuromuscular re-education, Balance training, Gait training, Patient/Family education, Self Care, Stair training, Orthotic/Fit training, DME instructions, and Aquatic Therapy  PLAN FOR NEXT SESSION:  Continue with low back stretching; ball exercises    Mariadelosang Wynns, [place], PT 07/12/2024, 11:09 AM

## 2024-07-12 NOTE — Telephone Encounter (Signed)
 Symptoms she is reporting can certainly be related to her recent COVID 19 infection and hopefully over time they resolve. If headache is severe and she noted new focal deficit she really needs to go to the ER also if CP with new SOB. As I explained during recent visit, residual symptoms after COVID 19 infection could last weeks and even months. Hycodan recommended at bedtime, just as needed and no longer than 10 days.She is on chronic opioid treatment for pain. She also has benzonatate  if needed. Thanks, BJ

## 2024-07-13 ENCOUNTER — Other Ambulatory Visit: Payer: Self-pay

## 2024-07-13 ENCOUNTER — Other Ambulatory Visit (HOSPITAL_COMMUNITY): Payer: Self-pay

## 2024-07-13 ENCOUNTER — Encounter: Payer: Self-pay | Admitting: Physical Therapy

## 2024-07-13 MED ORDER — GABAPENTIN 600 MG PO TABS
600.0000 mg | ORAL_TABLET | Freq: Three times a day (TID) | ORAL | 3 refills | Status: AC
  Filled 2024-07-13: qty 90, 30d supply, fill #0
  Filled 2024-09-30: qty 90, 30d supply, fill #1

## 2024-07-13 NOTE — Telephone Encounter (Signed)
 My chart message read.

## 2024-07-14 ENCOUNTER — Other Ambulatory Visit (HOSPITAL_COMMUNITY): Payer: Self-pay

## 2024-07-14 ENCOUNTER — Ambulatory Visit: Payer: Self-pay | Admitting: Physical Therapy

## 2024-07-14 DIAGNOSIS — M5459 Other low back pain: Secondary | ICD-10-CM | POA: Diagnosis not present

## 2024-07-14 DIAGNOSIS — M6281 Muscle weakness (generalized): Secondary | ICD-10-CM

## 2024-07-14 NOTE — Therapy (Unsigned)
 OUTPATIENT PHYSICAL THERAPY NEURO TREATMENT NOTE        Patient Name: [name] MRN: [id number] DOB:Sep 09, 1961, 63 y.o., female Today's Date: 06/30/2024   PCP: [place], [name]., MD REFERRING PROVIDER: Babs Arthea DASEN, MD  END OF SESSION:                          Past Medical History:  Diagnosis Date   Anxiety    Aortic atherosclerosis (HCC) 04/16/2021   Atypical angina (HCC)    Back pain    related to spinal stenosis and disc problem, radiates down left buttocks to leg., weakness occ.   Chest pain    a. 03/2015 Cath: nl cors; b. 03/2021 Cor CTA: Ca2+ = 0. Nl Cors.   Chronic pain syndrome    Dyspnea    hx   GERD (gastroesophageal reflux disease)    Grade I diastolic dysfunction    Headache    Hyperlipidemia    Hypertension    Lumbar post-laminectomy syndrome    LVH (left ventricular hypertrophy) 12/15/2020   a. 11/2020 Echo: EF 65-70%, no rwma, sev asymm LVH with IVSd 1.9 cm. No LVOT obs @ rest. Gr1 DD. Triv MR.   PONV (postoperative nausea and vomiting)    Pulmonary nodules    a. 03/2021 CT Chest: 3mm pulm nodules in bilat lower lobes. F/u 1 yr.   Right foot drop    Syncope    a. 11/2020 Zio: No significant arrhythmias.   Vaginal foreign object    Uses Femring    Past Surgical History:  Procedure Laterality Date   ABDOMINAL HYSTERECTOMY     ANTERIOR CERVICAL DECOMP/DISCECTOMY FUSION N/A 01/03/2022   Procedure: Anterior Cervical Decompression Fusion - Cervical four-Cervical five - Cervical five-Cervical six;  Surgeon: Joshua Alm RAMAN, MD;  Location: [facility] OR;  Service: Neurosurgery;  Laterality: N/A;   BIOPSY  12/16/2020   Procedure: BIOPSY;  Surgeon: Aneita Gwendlyn DASEN, MD;  Location: [facility] ENDOSCOPY;  Service: Endoscopy;;   CARDIAC CATHETERIZATION N/A 04/18/2015   Procedure: Left Heart Cath and Coronary Angiography;  Surgeon: Rober Chroman, MD;  Location: [facility] INVASIVE CV LAB;  Service: Cardiovascular;  Laterality: N/A;   COLONOSCOPY      COLONOSCOPY W/ BIOPSIES AND POLYPECTOMY  2018   ESOPHAGOGASTRODUODENOSCOPY (EGD) WITH PROPOFOL  N/A 12/16/2020   Procedure: ESOPHAGOGASTRODUODENOSCOPY (EGD) WITH PROPOFOL ;  Surgeon: Aneita Gwendlyn DASEN, MD;  Location: [facility] ENDOSCOPY;  Service: Endoscopy;  Laterality: N/A;   FOOT SURGERY Bilateral    [facility] bunion,bone spur, tendon (1) -6'16, (1)-10'16   HEMATOMA EVACUATION N/A 01/05/2022   Procedure: Cervical Wound Exploration;  Surgeon: Gillie Duncans, MD;  Location: [facility] OR;  Service: Neurosurgery;  Laterality: N/A;   IR EPIDUROGRAPHY  07/21/2018   LUMBAR LAMINECTOMY/DECOMPRESSION MICRODISCECTOMY Bilateral 12/28/2015   Procedure: MICRO LUMBAR DECOMPRESSION L4 - L5 BILATERALLY;  Surgeon: Reyes Billing, MD;  Location: [facility] ORS;  Service: Orthopedics;  Laterality: Bilateral;   LUMBAR LAMINECTOMY/DECOMPRESSION MICRODISCECTOMY Bilateral 03/04/2018   Procedure: Revision of Microlumbar Decompression Bilateral Lumbar Four-Five;  Surgeon: Billing Reyes, MD;  Location: [facility] OR;  Service: Orthopedics;  Laterality: Bilateral;  90 mins   LUMBAR WOUND DEBRIDEMENT N/A 12/25/2023   Procedure: Irrigation and debridement of lumbar wound;  Surgeon: Joshua Alm Hamilton, MD;  Location: [facility] OR;  Service: Neurosurgery;  Laterality: N/A;   POSTERIOR FUSION PEDICLE SCREW PLACEMENT N/A 12/07/2023   Procedure: LUMBAR FOUR-FIVE POSTERIOR LATERAL FUSION;  Surgeon: Joshua Alm Hamilton, MD;  Location: [facility] OR;  Service:  Neurosurgery;  Laterality: N/A;   SAVORY DILATION N/A 12/16/2020   Procedure: SAVORY DILATION;  Surgeon: Aneita Gwendlyn DASEN, MD;  Location: [facility] ENDOSCOPY;  Service: Endoscopy;  Laterality: N/A;   SPINAL CORD STIMULATOR INSERTION N/A 09/28/2019   Procedure: THORACIC SPINAL CORD STIMULATOR INSERTION;  Surgeon: Burnetta Aures, MD;  Location: [facility] OR;  Service: Orthopedics;  Laterality: N/A;  2.5 hrs   SPINAL CORD STIMULATOR REMOVAL N/A 05/27/2021   Procedure: LUMBAR SPINAL CORD STIMULATOR REMOVAL;  Surgeon: Bluford Standing, MD;  Location: [facility] ORS;  Service: Neurosurgery;  Laterality: N/A;   TUBAL LIGATION     WISDOM TOOTH EXTRACTION     WOUND EXPLORATION N/A 03/04/2018   Procedure: EXPLORATION OF LUMBAR DECOMPRESSION WOUND;  Surgeon: Duwayne Purchase, MD;  Location: [facility] OR;  Service: Orthopedics;  Laterality: N/A;   Patient Active Problem List   Diagnosis Date Noted   Vitamin D  deficiency, unspecified 04/13/2024   Seasonal allergic rhinitis due to pollen 01/29/2024   Delayed wound healing 12/25/2023   S/P lumbar fusion 12/07/2023   Pain in thumb joint with movement of right hand 08/12/2023   Localized primary osteoarthritis of carpometacarpal joint of right thumb 07/29/2023   Right foot drop 07/29/2023   Frequent falls 04/14/2023   Gastroesophageal reflux disease 04/14/2023   Right upper extremity numbness 09/29/2022   Stage 3a chronic kidney disease (HCC) 09/29/2022   Cervical spondylosis with radiculopathy 01/05/2022   S/P cervical spinal fusion 01/03/2022   Cervical disc disorder with radiculopathy of cervical region 11/27/2021   Uncontrolled pain 05/27/2021   Syncope 05/13/2021   Depression with anxiety 05/13/2021   Chronic diastolic CHF (congestive heart failure) (HCC) 05/13/2021   Chronic back pain 05/13/2021   Chest tightness 05/13/2021   Lower abdominal pain 05/13/2021   Syncope and collapse 01/01/2021   Abnormal echocardiogram 01/01/2021   BPPV (benign paroxysmal positional vertigo), left 12/26/2020   Odynophagia    Dysphagia 12/15/2020   Postural dizziness with presyncope 12/14/2020   Lumbar post-laminectomy syndrome    Chronic pain 09/28/2019   Lumbar spinal stenosis    Radicular pain    Gait disorder    Difficulty in urination 07/13/2018   Back pain 07/12/2018   Hyperlipidemia 04/16/2018   GAD (generalized anxiety disorder) 04/16/2018   AKI (acute kidney injury) (HCC)    Benign essential HTN    Major depression, recurrent (HCC)    Lumbar radiculopathy 03/09/2018    Paraparesis (HCC) 03/09/2018   Essential hypertension    Chronic nonintractable headache    Leukocytosis    Acute blood loss anemia    Postoperative pain    Generalized weakness    Spinal stenosis at L4-L5 level 03/04/2018   Spinal stenosis of lumbar region 12/28/2015   Chest pain 04/16/2015    ONSET DATE: Most recent Surgery date 12-07-23:  Referral date 02-03-24  REFERRING DIAG:  M54.16 (ICD-10-CM) - Lumbar radiculopathy M96.1 (ICD-10-CM) - Lumbar post-laminectomy syndrome R26.9 (ICD-10-CM) - Gait disorder  THERAPY DIAG:  No diagnosis found.  Rationale for Evaluation and Treatment: Rehabilitation  SUBJECTIVE:  SUBJECTIVE STATEMENT: Pt reports she did OK after PT on Tuesday this week; reports pain was a little less at end of session on Tuesday, said it was about a 4/10 intensity Pt accompanied by: self  PERTINENT HISTORY: The patient was admitted on 12/07/2023 &  underwent PLIF L4-5. Hospitalized 12-07-23 - 12-11-23:  The patient was admitted on 12/25/2023 and taken to the operating room where the patient underwent irrigation and debridement of lumbar wound with primary closure and placement of external wound VAC- hospitalized 12-25-23 -  12-26-23  Frequent falls:  Spinal cord stimulator insertion on 09-28-19; stimulator removal on 05-27-21 with resultant LLE weakness;  decompression and laminectomy on 12/28/15 at L4-L5  with redo decompression surgery on May 9,2019, post-op paraparesis with surgery to remove small hematoma; 07/12/18 fall at work (involved rolling chair) with hospital admission, transfer to CIR with d/c home 08/03/18; HTN, Bil foot surgery; left ventricular hypertrophy, chronic pain syndrome, dyspnea, hypertrophic cardiomyopathy; cervical fusion March 2023;  had carpal tunnel surgery RUE in April 2024:   LVH  PAIN:  Are you having pain? Pt rates pain 6/10 in lumbar region - greater pain in lower lumbar region near sacral area- same burning sensation Alleviating Factor:  placing pillow behind back when sitting Aggravating Factors: certain movements    PRECAUTIONS: Fall; No bending/lifting or twisting; pt states she has a reacher  WEIGHT BEARING RESTRICTIONS: No  FALLS: Has patient fallen in last 6 months? Yes. Number of falls 3  LIVING ENVIRONMENT: Lives with: lives with their spouse Lives in: House/apartment Stairs: Yes: External: 5 steps; on right going up Has following equipment at home: Single point cane, Wheelchair (manual), and L[place]strand crutch  PLOF: Independent with basic ADLs, Independent with household mobility with device, Independent with community mobility with device, and Independent with transfers  PATIENT GOALS: improve walking and strength Rt leg  OBJECTIVE:   DIAGNOSTIC FINDINGS: IMPRESSION: 1. Interval anterior discectomy and fusion from C4 through C6 with improved patency of the spinal canal and only mild residual foraminal narrowing. 2. Stable mild spinal stenosis and mild to moderate left foraminal narrowing at C3-4. 3. Stable mild spinal stenosis and mild foraminal narrowing bilaterally at C6-7. Mild foraminal narrowing bilaterally at C7-T1. 4. No cord deformity or abnormal cord signal.  No acute findings. No recent MRI of lumbar region  COGNITION: Overall cognitive status: Within functional limits for tasks assessed   SENSATION: WFL  COORDINATION: Decreased RLE due to weakness and spasticity  POSTURE: No Significant postural limitations  LOWER EXTREMITY ROM:   LLE WFL's;  RLE WFL's except for ankle ROM which is minimal due to weakness; Pt has difficulty performing Rt knee extension due to Rt quad weakness and spasticity - turned body slightly to Lt to achieve more knee extension   LOWER EXTREMITY MMT:    MMT Right Eval Left Eval  Hip  flexion 3- 3+   Hip extension    Hip abduction    Hip adduction    Hip internal rotation    Hip external rotation    Knee flexion 3+   Knee extension 4- 4  Ankle dorsiflexion 2-   Ankle plantarflexion    Ankle inversion    Ankle eversion    (Blank rows = not tested)  BED MOBILITY:  Modified independent  TRANSFERS: Assistive device utilized: Rollator  Sit to stand: Modified independence Stand to sit: Modified independence   STAIRS:  Not tested due to c/o back pain   GAIT: Gait pattern: decreased step length- Right,  decreased stance time- Right, decreased hip/knee flexion- Right, and decreased ankle dorsiflexion- Right Distance walked: 81' Assistive device utilized: with rollator Level of assistance: SBA and CGA Comments: slow gait speed due to c/o back pain and RLE weakness  FUNCTIONAL TESTS:  Sit to stand score - 2 reps - with bil. UE support from chair- 29.91 secs             TUG score: - 49.19 secs with rollator  Gait velocity:  37.38 secs = .88 ft/sec with rollator  PATIENT SURVEYS:  N/A - based on diagnosis of RLE weakness due to chronic lumbar radiculopathy   TODAY'S TREATMENT: 07-14-24                                                                                                                           Gait: Pt using rollator and wearing Blue rocker AFO on RLE - pt modified independent with use of rollator; ambulating very slowly due to c/o low back pain  Pt amb. Clinic distances with rollator with supervision   TherEx:  Pt performed following exercises in supine position to reduce back pain and to increase strength in low back musc. and ROM of lumbar spine:  Pelvic tilt 10 reps 5 sec hold Pelvic tilt with RLE heel slide 10 reps:  LLE 10 reps Pelvic tilt with hip flexion in hooklying - 10 reps each leg - RLE and LLE Glut squeezes 10 reps - 3 sec hold Small range trunk rotation to Lt and Rt sides 10 times each side - approx. 2-3 sec hold SciFit level  2.5 x 10 with bil. UE's & LE's ==========================  Pelvic tilt 10 reps 5 sec hold Pelvic tilt with RLE heel slide 10 reps: LLE 10 reps Pelvic tilt with hip flexion in hooklying - 10 reps each leg - RLE and LLE Glut squeezes 10 reps - 3 sec hold Small range trunk rotation to Lt and Rt sides 7 times each side - approx. 2-3 sec hold SAQ RLE 10 reps - 3 sec hold SciFit level   Medbridge:   Access Code: [email or web address] URL: [email or web address] Date: 05/23/2024 Prepared by: Rock Kussmaul  Exercises - Supine Posterior Pelvic Tilt  - 1 x daily - 7 x weekly - 1 sets - 10 reps - 5 sec hold - Supine Heel Slides  - 1 x daily - 7 x weekly - 1 sets - 10 reps - Lifting leg up/down in this position  - 1 x daily - 7 x weekly - 1 sets - 10 reps - Supine Lower Trunk Rotation  - 1 x daily - 7 x weekly - 1 sets - 5 reps - Supine Bridge with Pelvic Floor Contraction  - 1 x daily - 7 x weekly - 1 sets - 5 reps - 3 sec hold   UPDATED HEP: Access Code: [email or web address] URL: [email or web address] Date: 04/27/2024 Prepared by: Rock Kussmaul  Exercises - Seated Knee Extension with Resistance  - 1 x daily -  7 x weekly - 3 sets - 10 reps - TAP UPS - with Left leg   - 1 x daily - 7 x weekly - 3 sets - 10 reps - Standing March with Counter Support  - 1 x daily - 7 x weekly - 3 sets - 10 reps - Forward Step Up with Counter Support  - 1 x daily - 7 x weekly - 3 sets - 10 reps - Alternating Step Forward with Support  - 1 x daily - 7 x weekly - 3 sets - 10 reps   PATIENT EDUCATION: Education details:  Medbridge  Person educated: Patient Education method: Explanation - handout declined as pt stated she has it from previous OP admission Education comprehension: verbalized understanding  HOME EXERCISE PROGRAM:   GOALS: Goals reviewed with patient? Yes    UPDATED LONG TERM GOALS: Target date: 06-10-24   Amb. 350' with RW with SBA without requiring seated rest period. Baseline:  134' x 1, 87' x 1 rep - 04-19-24;  measure lobby Goal status: Not assessed on 06-28-24 due to c/o increased low back pain  2.  Improve TUG score to </= 22 secs  with use of RW for improved mobility and reduced fall risk. Baseline: 49.19 secs with rollator;  28.5 secs with rollator - 04-19-24;  43.47 secs with rollator - 06-28-24 Goal status:  Ongoing  3.  Perform 5x sit to stand transfers with LUE support only to demo improved strength in RLE IN </= 25 secs to demo improved functional mobility. Baseline: 36.16 secs - 4x with bil. UE support from chair - had to stop due to burning sensation in low back;  42.97 secs 06-28-24 Goal status:  Ongoing  4.  Increase gait velocity to >/= 1.6 ft/sec with rollator for increased gait efficiency with reduced fall risk.          Baseline: 37.38 secs = .88 ft/sec with rollator;  33.44 secs = .98 ft/sec with rollator;  36.22, 38.09 secs          Goal status:  ONGOING   6.  Independent in updated HEP for RLE strengthening. Baseline:  Goal status:  Goal met 06-09-24   7.  Report reduced low back pain at rest to </= 3/10 intensity.            Baseline:  7/10;    04-20-24 -- 5/10;    5/10 = 06-09-24;  06-28-24 = 8/10 intensity            Goal status: Not met 06-09-24; not met 06-28-24   UPDATED SHORT TERM GOALS:  TARGET DATE 07-22-24:  1.  Amb. 230' with RW with SBA without requiring seated rest period.             Baseline: 134' x 1, 87' x 1 rep on 04-19-24;  approx. 100' from lobby to mat table in gym with rollator on 06-28-24     Goal status:  Ongoing  2.  Improve TUG score to </= 30 secs  with use of RW for improved mobility and reduced fall risk. Baseline: 49.19 secs with rollator;  28.5 secs with rollator; 43.47 secs on 06-28-24 Goal status:  Revised  3.  Perform 5x sit to stand from chair with bil. UE support in </= 30 secs to demonstrate improved mobility.     Baseline:  36.16 secs - 4 reps with bil. UE support from chair:  42.97 secs 06-28-24    Goal status:   Ongoing  4.  Pt will report reduced pain in low back to </= 5/10 intensity for increased ease with ADL's and mobility.    Baseline:  5/10 on 04-19-24:  8/10 on 06-28-24    Goal status:  Revised   UPDATED LONG TERM GOALS: Target date: 08-19-24   Amb. 350' with RW with SBA without requiring seated rest period. Baseline: 134' x 1, 87' x 1 rep - 04-19-24;  measure lobby Goal status: Not assessed on 06-28-24 due to c/o increased low back pain; Ongoing goal  2.  Improve TUG score to </= 25 secs  with use of RW for improved mobility and reduced fall risk. Baseline: 49.19 secs with rollator;  28.5 secs with rollator - 04-19-24;  43.47 secs with rollator - 06-28-24 Goal status:  Revised   3.  Perform 5x sit to stand transfers with LUE support only to demo improved strength in RLE IN </= 25 secs to demo improved functional mobility. Baseline: 36.16 secs - 4x with bil. UE support from chair - had to stop due to burning sensation in low back;  42.97 secs 06-28-24 Goal status:  Ongoing  4.  Increase gait velocity to >/= 1.6 ft/sec with rollator for increased gait efficiency with reduced fall risk.          Baseline: 37.38 secs = .88 ft/sec with rollator;  33.44 secs = .98 ft/sec with rollator; 06-28-24 =  36.22, 38.09 secs          Goal status:  ONGOING   6.  Independent in revised/updated HEP for RLE strengthening. Baseline:  Goal status: Revised   7.  Report reduced low back pain at rest to </= 3/10 intensity.            Baseline:  7/10;    04-20-24 -- 5/10;    5/10 = 06-09-24;  06-28-24 = 8/10 intensity            Goal status: Ongoing   ASSESSMENT:   CLINICAL IMPRESSION: Pt returns to PT since previous session on 06-30-24 due to having a setback due to Covid.  Pt reports slightly less back pain in today's session (rating 6/10 intensity) compared to that in previous session on 06-30-24 (7-8/10).  Pt continues to c/o light-headedness with transfer supine to sitting after performing exercises in supine  position, but dizziness subsides after approx. 20-30 secs.  Pt able to perform SciFit in today's session, whereas, this exercise was not performed in previous PT session due to c/o back pain.  Pt is progressing slowly but steadily; progress has been impacted by contraction of Covid in late Aug. and fall sustained in hospital resulting in increased low back pain.  Cont with POC.   OBJECTIVE IMPAIRMENTS: Abnormal gait, decreased balance, decreased coordination, decreased endurance, decreased strength, and impaired tone.  ACTIVITY LIMITATIONS: carrying, bending, squatting, stairs, transfers, and locomotion level  PARTICIPATION LIMITATIONS: cleaning, laundry, shopping, and community activity  PERSONAL FACTORS: Behavior pattern, Past/current experiences, and Time since onset of injury/illness/exacerbation are also affecting patient's functional outcome.   REHAB POTENTIAL: Good  CLINICAL DECISION MAKING: Evolving/moderate complexity  EVALUATION COMPLEXITY: Moderate  PLAN:  PT FREQUENCY: 2x/week  PT DURATION: 8 weeks  PLANNED INTERVENTIONS: Therapeutic exercises, Therapeutic activity, Neuromuscular re-education, Balance training, Gait training, Patient/Family education, Self Care, Stair training, Orthotic/Fit training, DME instructions, and Aquatic Therapy  PLAN FOR NEXT SESSION:  Continue with low back stretching; ball exercises    Kalem Rockwell, [place], PT 07/14/2024, 11:55 AM

## 2024-07-15 ENCOUNTER — Encounter: Payer: Self-pay | Admitting: Physical Therapy

## 2024-07-18 ENCOUNTER — Other Ambulatory Visit (HOSPITAL_COMMUNITY): Payer: Self-pay

## 2024-07-19 ENCOUNTER — Ambulatory Visit: Payer: Self-pay | Admitting: Physical Therapy

## 2024-07-19 ENCOUNTER — Encounter: Payer: Self-pay | Admitting: Physical Therapy

## 2024-07-19 DIAGNOSIS — M5459 Other low back pain: Secondary | ICD-10-CM

## 2024-07-19 DIAGNOSIS — M6281 Muscle weakness (generalized): Secondary | ICD-10-CM

## 2024-07-19 NOTE — Therapy (Signed)
 OUTPATIENT PHYSICAL THERAPY NEURO TREATMENT NOTE        Patient Name: [name] MRN: [id number] DOB:1960/11/21, 63 y.o., female Today's Date: 06/30/2024   PCP: [place], [name]., MD REFERRING PROVIDER: Babs Arthea DASEN, MD  END OF SESSION:  PT End of Session - 07/19/24 2109     Visit Number 23    Number of Visits 36    Date for Recertification  08/19/24    Authorization Type [employer]    Authorization Time Period 02-16-24 -04-25-24:  04-19-24 - 06-26-24;  06-28-24 - 08-26-24    Progress Note Due on Visit 30    PT Start Time 1017    PT Stop Time 1100    PT Time Calculation (min) 43 min    Activity Tolerance Patient limited by pain    Behavior During Therapy [facility] for tasks assessed/performed                                  Past Medical History:  Diagnosis Date   Anxiety    Aortic atherosclerosis 04/16/2021   Atypical angina    Back pain    related to spinal stenosis and disc problem, radiates down left buttocks to leg., weakness occ.   Chest pain    a. 03/2015 Cath: nl cors; b. 03/2021 Cor CTA: Ca2+ = 0. Nl Cors.   Chronic pain syndrome    Dyspnea    hx   GERD (gastroesophageal reflux disease)    Grade I diastolic dysfunction    Headache    Hyperlipidemia    Hypertension    Lumbar post-laminectomy syndrome    LVH (left ventricular hypertrophy) 12/15/2020   a. 11/2020 Echo: EF 65-70%, no rwma, sev asymm LVH with IVSd 1.9 cm. No LVOT obs @ rest. Gr1 DD. Triv MR.   PONV (postoperative nausea and vomiting)    Pulmonary nodules    a. 03/2021 CT Chest: 3mm pulm nodules in bilat lower lobes. F/u 1 yr.   Right foot drop    Syncope    a. 11/2020 Zio: No significant arrhythmias.   Vaginal foreign object    Uses Femring    Past Surgical History:  Procedure Laterality Date   ABDOMINAL HYSTERECTOMY     ANTERIOR CERVICAL DECOMP/DISCECTOMY FUSION N/A 01/03/2022   Procedure: Anterior Cervical Decompression Fusion - Cervical  four-Cervical five - Cervical five-Cervical six;  Surgeon: Joshua Alm RAMAN, MD;  Location: [facility] OR;  Service: Neurosurgery;  Laterality: N/A;   BIOPSY  12/16/2020   Procedure: BIOPSY;  Surgeon: Aneita Gwendlyn DASEN, MD;  Location: [facility] ENDOSCOPY;  Service: Endoscopy;;   CARDIAC CATHETERIZATION N/A 04/18/2015   Procedure: Left Heart Cath and Coronary Angiography;  Surgeon: Rober Chroman, MD;  Location: [facility] INVASIVE CV LAB;  Service: Cardiovascular;  Laterality: N/A;   COLONOSCOPY     COLONOSCOPY W/ BIOPSIES AND POLYPECTOMY  2018   ESOPHAGOGASTRODUODENOSCOPY (EGD) WITH PROPOFOL  N/A 12/16/2020   Procedure: ESOPHAGOGASTRODUODENOSCOPY (EGD) WITH PROPOFOL ;  Surgeon: Aneita Gwendlyn DASEN, MD;  Location: [facility] ENDOSCOPY;  Service: Endoscopy;  Laterality: N/A;   FOOT SURGERY Bilateral    [facility] bunion,bone spur, tendon (1) -6'16, (1)-10'16   HEMATOMA EVACUATION N/A 01/05/2022   Procedure: Cervical Wound Exploration;  Surgeon: Gillie Duncans, MD;  Location: [facility] OR;  Service: Neurosurgery;  Laterality: N/A;   IR EPIDUROGRAPHY  07/21/2018   LUMBAR LAMINECTOMY/DECOMPRESSION MICRODISCECTOMY Bilateral 12/28/2015   Procedure: MICRO LUMBAR DECOMPRESSION L4 - L5 BILATERALLY;  Surgeon: Reyes Billing, MD;  Location: [facility] ORS;  Service: Orthopedics;  Laterality: Bilateral;   LUMBAR LAMINECTOMY/DECOMPRESSION MICRODISCECTOMY Bilateral 03/04/2018   Procedure: Revision of Microlumbar Decompression Bilateral Lumbar Four-Five;  Surgeon: Billing Reyes, MD;  Location: [facility] OR;  Service: Orthopedics;  Laterality: Bilateral;  90 mins   LUMBAR WOUND DEBRIDEMENT N/A 12/25/2023   Procedure: Irrigation and debridement of lumbar wound;  Surgeon: Joshua Alm Hamilton, MD;  Location: [facility] OR;  Service: Neurosurgery;  Laterality: N/A;   POSTERIOR FUSION PEDICLE SCREW PLACEMENT N/A 12/07/2023   Procedure: LUMBAR FOUR-FIVE POSTERIOR LATERAL FUSION;  Surgeon: Joshua Alm Hamilton, MD;  Location: [facility] OR;  Service: Neurosurgery;  Laterality: N/A;   SAVORY  DILATION N/A 12/16/2020   Procedure: SAVORY DILATION;  Surgeon: Aneita Gwendlyn DASEN, MD;  Location: [facility] ENDOSCOPY;  Service: Endoscopy;  Laterality: N/A;   SPINAL CORD STIMULATOR INSERTION N/A 09/28/2019   Procedure: THORACIC SPINAL CORD STIMULATOR INSERTION;  Surgeon: Burnetta Aures, MD;  Location: [facility] OR;  Service: Orthopedics;  Laterality: N/A;  2.5 hrs   SPINAL CORD STIMULATOR REMOVAL N/A 05/27/2021   Procedure: LUMBAR SPINAL CORD STIMULATOR REMOVAL;  Surgeon: Bluford Standing, MD;  Location: [facility] ORS;  Service: Neurosurgery;  Laterality: N/A;   TUBAL LIGATION     WISDOM TOOTH EXTRACTION     WOUND EXPLORATION N/A 03/04/2018   Procedure: EXPLORATION OF LUMBAR DECOMPRESSION WOUND;  Surgeon: Billing Reyes, MD;  Location: [facility] OR;  Service: Orthopedics;  Laterality: N/A;   Patient Active Problem List   Diagnosis Date Noted   Vitamin D  deficiency, unspecified 04/13/2024   Seasonal allergic rhinitis due to pollen 01/29/2024   Delayed wound healing 12/25/2023   S/P lumbar fusion 12/07/2023   Pain in thumb joint with movement of right hand 08/12/2023   Localized primary osteoarthritis of carpometacarpal joint of right thumb 07/29/2023   Right foot drop 07/29/2023   Frequent falls 04/14/2023   Gastroesophageal reflux disease 04/14/2023   Right upper extremity numbness 09/29/2022   Stage 3a chronic kidney disease (HCC) 09/29/2022   Cervical spondylosis with radiculopathy 01/05/2022   S/P cervical spinal fusion 01/03/2022   Cervical disc disorder with radiculopathy of cervical region 11/27/2021   Uncontrolled pain 05/27/2021   Syncope 05/13/2021   Depression with anxiety 05/13/2021   Chronic diastolic CHF (congestive heart failure) (HCC) 05/13/2021   Chronic back pain 05/13/2021   Chest tightness 05/13/2021   Lower abdominal pain 05/13/2021   Syncope and collapse 01/01/2021   Abnormal echocardiogram 01/01/2021   BPPV (benign paroxysmal positional vertigo), left 12/26/2020   Odynophagia     Dysphagia 12/15/2020   Postural dizziness with presyncope 12/14/2020   Lumbar post-laminectomy syndrome    Chronic pain 09/28/2019   Lumbar spinal stenosis    Radicular pain    Gait disorder    Difficulty in urination 07/13/2018   Back pain 07/12/2018   Hyperlipidemia 04/16/2018   GAD (generalized anxiety disorder) 04/16/2018   AKI (acute kidney injury)    Benign essential HTN    Major depression, recurrent    Lumbar radiculopathy 03/09/2018   Paraparesis (HCC) 03/09/2018   Essential hypertension    Chronic nonintractable headache    Leukocytosis    Acute blood loss anemia    Postoperative pain    Generalized weakness    Spinal stenosis at L4-L5 level 03/04/2018   Spinal stenosis of lumbar region 12/28/2015   Chest pain 04/16/2015    ONSET DATE: Most recent Surgery date 12-07-23:  Referral date 02-03-24  REFERRING DIAG:  M54.16 (ICD-10-CM) -  Lumbar radiculopathy M96.1 (ICD-10-CM) - Lumbar post-laminectomy syndrome R26.9 (ICD-10-CM) - Gait disorder  THERAPY DIAG:  Other low back pain  Muscle weakness (generalized)  Rationale for Evaluation and Treatment: Rehabilitation  SUBJECTIVE:                                                                                                                                                                                             SUBJECTIVE STATEMENT: Pt reports she fell getting out of bed yesterday -did not have brace on Rt leg as she was getting out of bed. Pt reports she hit her head also, thinks she hit it on side of bed - has had a HA but took 2 Tylenol  this morning so it has now subsided. Pt reports she laid on floor for a little while, as she was home alone - was able to roll to sidelying and use a chair to pull herself up off the floor - says she then got back in bed.  Pt reporting increased back pain today.  Pt accompanied by: self  PERTINENT HISTORY: The patient was admitted on 12/07/2023 &  underwent PLIF L4-5. Hospitalized  12-07-23 - 12-11-23:  The patient was admitted on 12/25/2023 and taken to the operating room where the patient underwent irrigation and debridement of lumbar wound with primary closure and placement of external wound VAC- hospitalized 12-25-23 -  12-26-23  Frequent falls:  Spinal cord stimulator insertion on 09-28-19; stimulator removal on 05-27-21 with resultant LLE weakness;  decompression and laminectomy on 12/28/15 at L4-L5  with redo decompression surgery on May 9,2019, post-op paraparesis with surgery to remove small hematoma; 07/12/18 fall at work (involved rolling chair) with hospital admission, transfer to CIR with d/c home 08/03/18; HTN, Bil foot surgery; left ventricular hypertrophy, chronic pain syndrome, dyspnea, hypertrophic cardiomyopathy; cervical fusion March 2023;  had carpal tunnel surgery RUE in April 2024:  LVH  PAIN:  Are you having pain? Pt rates pain 8/10 in lumbar region - greater pain in lower lumbar region near sacral area- same burning sensation Alleviating Factor:  placing pillow behind back when sitting Aggravating Factors: certain movements    PRECAUTIONS: Fall; No bending/lifting or twisting; pt states she has a reacher  WEIGHT BEARING RESTRICTIONS: No  FALLS: Has patient fallen in last 6 months? Yes. Number of falls 3  LIVING ENVIRONMENT: Lives with: lives with their spouse Lives in: House/apartment Stairs: Yes: External: 5 steps; on right going up Has following equipment at home: Single point cane, Wheelchair (manual), and L[place]strand crutch  PLOF: Independent with basic ADLs, Independent with household mobility with device, Independent with community mobility with device,  and Independent with transfers  PATIENT GOALS: improve walking and strength Rt leg  OBJECTIVE:   DIAGNOSTIC FINDINGS: IMPRESSION: 1. Interval anterior discectomy and fusion from C4 through C6 with improved patency of the spinal canal and only mild residual foraminal narrowing. 2. Stable mild  spinal stenosis and mild to moderate left foraminal narrowing at C3-4. 3. Stable mild spinal stenosis and mild foraminal narrowing bilaterally at C6-7. Mild foraminal narrowing bilaterally at C7-T1. 4. No cord deformity or abnormal cord signal.  No acute findings. No recent MRI of lumbar region  COGNITION: Overall cognitive status: Within functional limits for tasks assessed   SENSATION: WFL  COORDINATION: Decreased RLE due to weakness and spasticity  POSTURE: No Significant postural limitations  LOWER EXTREMITY ROM:   LLE WFL's;  RLE WFL's except for ankle ROM which is minimal due to weakness; Pt has difficulty performing Rt knee extension due to Rt quad weakness and spasticity - turned body slightly to Lt to achieve more knee extension   LOWER EXTREMITY MMT:    MMT Right Eval Left Eval  Hip flexion 3- 3+   Hip extension    Hip abduction    Hip adduction    Hip internal rotation    Hip external rotation    Knee flexion 3+   Knee extension 4- 4  Ankle dorsiflexion 2-   Ankle plantarflexion    Ankle inversion    Ankle eversion    (Blank rows = not tested)  BED MOBILITY:  Modified independent  TRANSFERS: Assistive device utilized: Rollator  Sit to stand: Modified independence Stand to sit: Modified independence   STAIRS:  Not tested due to c/o back pain   GAIT: Gait pattern: decreased step length- Right, decreased stance time- Right, decreased hip/knee flexion- Right, and decreased ankle dorsiflexion- Right Distance walked: 50' Assistive device utilized: with rollator Level of assistance: SBA and CGA Comments: slow gait speed due to c/o back pain and RLE weakness  FUNCTIONAL TESTS:  Sit to stand score - 2 reps - with bil. UE support from chair- 29.91 secs             TUG score: - 49.19 secs with rollator  Gait velocity:  37.38 secs = .88 ft/sec with rollator  PATIENT SURVEYS:  N/A - based on diagnosis of RLE weakness due to chronic lumbar  radiculopathy   TODAY'S TREATMENT: 07-19-24                                                                                                                           Gait: Pt using rollator and wearing Blue rocker AFO on RLE - pt modified independent with use of rollator; ambulating very slowly due to c/o low back pain  Pt amb. Clinic distances with rollator with supervision   TherEx:  Pt performed following exercises in supine position to reduce back pain and to increase strength in low back musc. and ROM of lumbar spine  Pelvic tilt 10 reps 5  sec Pelvic tilt with RLE heel slide with extension 10 reps -ice pack applied  Hip flexion with assistance 5 reps  Pain decreased to 6/10 SAQ's 10 reps - 3-4 sec hold; RLE 10 reps; bil. LE's 10 reps  Glut squeezes - 10 rep 3 sec  Sit to sidelying with +2 min assist due to c/o severe back pain - pain rating 10/10; pt transferred to chair for back support - ice pack applied - pt sat for approx. 5 - pain decreased and pt requested to do SciFit exercise  SciFit - level 2.0 x 5 with bil. Ue's and LE's - ice pack placed behind back during this exercise for pain relief    Medbridge:   Access Code: [email or web address] URL: [email or web address] Date: 05/23/2024 Prepared by: Rock Kussmaul  Exercises - Supine Posterior Pelvic Tilt  - 1 x daily - 7 x weekly - 1 sets - 10 reps - 5 sec hold - Supine Heel Slides  - 1 x daily - 7 x weekly - 1 sets - 10 reps - Lifting leg up/down in this position  - 1 x daily - 7 x weekly - 1 sets - 10 reps - Supine Lower Trunk Rotation  - 1 x daily - 7 x weekly - 1 sets - 5 reps - Supine Bridge with Pelvic Floor Contraction  - 1 x daily - 7 x weekly - 1 sets - 5 reps - 3 sec hold   UPDATED HEP: Access Code: [email or web address] URL: [email or web address] Date: 04/27/2024 Prepared by: Rock Kussmaul  Exercises - Seated Knee Extension with Resistance  - 1 x daily - 7 x weekly - 3 sets - 10 reps - TAP UPS -  with Left leg   - 1 x daily - 7 x weekly - 3 sets - 10 reps - Standing March with Counter Support  - 1 x daily - 7 x weekly - 3 sets - 10 reps - Forward Step Up with Counter Support  - 1 x daily - 7 x weekly - 3 sets - 10 reps - Alternating Step Forward with Support  - 1 x daily - 7 x weekly - 3 sets - 10 reps   PATIENT EDUCATION: Education details:  Medbridge  Person educated: Patient Education method: Explanation - handout declined as pt stated she has it from previous OP admission Education comprehension: verbalized understanding  HOME EXERCISE PROGRAM:   GOALS: Goals reviewed with patient? Yes    UPDATED LONG TERM GOALS: Target date: 06-10-24   Amb. 350' with RW with SBA without requiring seated rest period. Baseline: 134' x 1, 87' x 1 rep - 04-19-24;  measure lobby Goal status: Not assessed on 06-28-24 due to c/o increased low back pain  2.  Improve TUG score to </= 22 secs  with use of RW for improved mobility and reduced fall risk. Baseline: 49.19 secs with rollator;  28.5 secs with rollator - 04-19-24;  43.47 secs with rollator - 06-28-24 Goal status:  Ongoing  3.  Perform 5x sit to stand transfers with LUE support only to demo improved strength in RLE IN </= 25 secs to demo improved functional mobility. Baseline: 36.16 secs - 4x with bil. UE support from chair - had to stop due to burning sensation in low back;  42.97 secs 06-28-24 Goal status:  Ongoing  4.  Increase gait velocity to >/= 1.6 ft/sec with rollator for increased gait efficiency with reduced fall risk.          Baseline: 37.38  secs = .88 ft/sec with rollator;  33.44 secs = .98 ft/sec with rollator;  36.22, 38.09 secs          Goal status:  ONGOING   6.  Independent in updated HEP for RLE strengthening. Baseline:  Goal status:  Goal met 06-09-24   7.  Report reduced low back pain at rest to </= 3/10 intensity.            Baseline:  7/10;    04-20-24 -- 5/10;    5/10 = 06-09-24;  06-28-24 = 8/10 intensity             Goal status: Not met 06-09-24; not met 06-28-24   UPDATED SHORT TERM GOALS:  TARGET DATE 07-22-24:  1.  Amb. 230' with RW with SBA without requiring seated rest period.             Baseline: 134' x 1, 87' x 1 rep on 04-19-24;  approx. 100' from lobby to mat table in gym with rollator on 06-28-24     Goal status:  Ongoing  2.  Improve TUG score to </= 30 secs  with use of RW for improved mobility and reduced fall risk. Baseline: 49.19 secs with rollator;  28.5 secs with rollator; 43.47 secs on 06-28-24 Goal status:  Revised  3.  Perform 5x sit to stand from chair with bil. UE support in </= 30 secs to demonstrate improved mobility.     Baseline:  36.16 secs - 4 reps with bil. UE support from chair:  42.97 secs 06-28-24    Goal status:  Ongoing     4.  Pt will report reduced pain in low back to </= 5/10 intensity for increased ease with ADL's and mobility.    Baseline:  5/10 on 04-19-24:  8/10 on 06-28-24    Goal status:  Revised   UPDATED LONG TERM GOALS: Target date: 08-19-24   Amb. 350' with RW with SBA without requiring seated rest period. Baseline: 134' x 1, 87' x 1 rep - 04-19-24;  measure lobby Goal status: Not assessed on 06-28-24 due to c/o increased low back pain; Ongoing goal  2.  Improve TUG score to </= 25 secs  with use of RW for improved mobility and reduced fall risk. Baseline: 49.19 secs with rollator;  28.5 secs with rollator - 04-19-24;  43.47 secs with rollator - 06-28-24 Goal status:  Revised   3.  Perform 5x sit to stand transfers with LUE support only to demo improved strength in RLE IN </= 25 secs to demo improved functional mobility. Baseline: 36.16 secs - 4x with bil. UE support from chair - had to stop due to burning sensation in low back;  42.97 secs 06-28-24 Goal status:  Ongoing  4.  Increase gait velocity to >/= 1.6 ft/sec with rollator for increased gait efficiency with reduced fall risk.          Baseline: 37.38 secs = .88 ft/sec with rollator;  33.44 secs = .98 ft/sec  with rollator; 06-28-24 =  36.22, 38.09 secs          Goal status:  ONGOING   6.  Independent in revised/updated HEP for RLE strengthening. Baseline:  Goal status: Revised   7.  Report reduced low back pain at rest to </= 3/10 intensity.            Baseline:  7/10;    04-20-24 -- 5/10;    5/10 = 06-09-24;  06-28-24 = 8/10 intensity  Goal status: Ongoing   ASSESSMENT:   CLINICAL IMPRESSION: PT session focused on exercises for low back strengthening and pain reduction.  Pt reported increased pain in today's session due to fall sustained at home in bedroom yesterday morning when she got out of bed and took a step without AFO on RLE.  Pt reports Rt knee buckled and she fell down.  Pt rated pain 8/10 at start of session.  Pt reported severe pain with transferring Lt sidelying to sitting position, with +2 mod assist required due to c/o back pain.  Ice pack applied and pt reported pain reduced with sitting in chair with ice pack on low back.  Today's session limited by c/o increased low back pain.  Cont with POC.   OBJECTIVE IMPAIRMENTS: Abnormal gait, decreased balance, decreased coordination, decreased endurance, decreased strength, and impaired tone.  ACTIVITY LIMITATIONS: carrying, bending, squatting, stairs, transfers, and locomotion level  PARTICIPATION LIMITATIONS: cleaning, laundry, shopping, and community activity  PERSONAL FACTORS: Behavior pattern, Past/current experiences, and Time since onset of injury/illness/exacerbation are also affecting patient's functional outcome.   REHAB POTENTIAL: Good  CLINICAL DECISION MAKING: Evolving/moderate complexity  EVALUATION COMPLEXITY: Moderate  PLAN:  PT FREQUENCY: 2x/week  PT DURATION: 8 weeks  PLANNED INTERVENTIONS: Therapeutic exercises, Therapeutic activity, Neuromuscular re-education, Balance training, Gait training, Patient/Family education, Self Care, Stair training, Orthotic/Fit training, DME instructions, and Aquatic  Therapy  PLAN FOR NEXT SESSION:  How did appt with Dr. Joshua go? Continue with low back stretching; ball exercises; pelvic rocking if tolerated    Brigitte Soderberg, [place], PT 07/19/2024, 9:11 PM

## 2024-07-26 ENCOUNTER — Encounter: Payer: Self-pay | Admitting: Physical Therapy

## 2024-07-26 ENCOUNTER — Ambulatory Visit: Payer: Self-pay | Admitting: Physical Therapy

## 2024-07-26 DIAGNOSIS — M6281 Muscle weakness (generalized): Secondary | ICD-10-CM

## 2024-07-26 DIAGNOSIS — M5459 Other low back pain: Secondary | ICD-10-CM

## 2024-07-26 NOTE — Therapy (Signed)
 OUTPATIENT PHYSICAL THERAPY NEURO TREATMENT NOTE        Patient Name: [name] MRN: [id number] DOB:1961-05-31, 63 y.o., female Today's Date: 06/30/2024   PCP: [place], [name]., MD REFERRING PROVIDER: Babs Arthea DASEN, MD  END OF SESSION:  PT End of Session - 07/26/24 1018     Visit Number 24    Number of Visits 36    Date for Recertification  08/19/24    Authorization Type [employer]    Authorization Time Period 02-16-24 -04-25-24:  04-19-24 - 06-26-24;  06-28-24 - 08-26-24    Progress Note Due on Visit 30    PT Start Time 1016    PT Stop Time 1102    PT Time Calculation (min) 46 min    Activity Tolerance Patient tolerated treatment well;Patient limited by pain    Behavior During Therapy [facility] for tasks assessed/performed                                  Past Medical History:  Diagnosis Date   Anxiety    Aortic atherosclerosis 04/16/2021   Atypical angina    Back pain    related to spinal stenosis and disc problem, radiates down left buttocks to leg., weakness occ.   Chest pain    a. 03/2015 Cath: nl cors; b. 03/2021 Cor CTA: Ca2+ = 0. Nl Cors.   Chronic pain syndrome    Dyspnea    hx   GERD (gastroesophageal reflux disease)    Grade I diastolic dysfunction    Headache    Hyperlipidemia    Hypertension    Lumbar post-laminectomy syndrome    LVH (left ventricular hypertrophy) 12/15/2020   a. 11/2020 Echo: EF 65-70%, no rwma, sev asymm LVH with IVSd 1.9 cm. No LVOT obs @ rest. Gr1 DD. Triv MR.   PONV (postoperative nausea and vomiting)    Pulmonary nodules    a. 03/2021 CT Chest: 3mm pulm nodules in bilat lower lobes. F/u 1 yr.   Right foot drop    Syncope    a. 11/2020 Zio: No significant arrhythmias.   Vaginal foreign object    Uses Femring    Past Surgical History:  Procedure Laterality Date   ABDOMINAL HYSTERECTOMY     ANTERIOR CERVICAL DECOMP/DISCECTOMY FUSION N/A 01/03/2022   Procedure: Anterior Cervical  Decompression Fusion - Cervical four-Cervical five - Cervical five-Cervical six;  Surgeon: Joshua Alm RAMAN, MD;  Location: [facility] OR;  Service: Neurosurgery;  Laterality: N/A;   BIOPSY  12/16/2020   Procedure: BIOPSY;  Surgeon: Aneita Gwendlyn DASEN, MD;  Location: [facility] ENDOSCOPY;  Service: Endoscopy;;   CARDIAC CATHETERIZATION N/A 04/18/2015   Procedure: Left Heart Cath and Coronary Angiography;  Surgeon: Rober Chroman, MD;  Location: [facility] INVASIVE CV LAB;  Service: Cardiovascular;  Laterality: N/A;   COLONOSCOPY     COLONOSCOPY W/ BIOPSIES AND POLYPECTOMY  2018   ESOPHAGOGASTRODUODENOSCOPY (EGD) WITH PROPOFOL  N/A 12/16/2020   Procedure: ESOPHAGOGASTRODUODENOSCOPY (EGD) WITH PROPOFOL ;  Surgeon: Aneita Gwendlyn DASEN, MD;  Location: [facility] ENDOSCOPY;  Service: Endoscopy;  Laterality: N/A;   FOOT SURGERY Bilateral    [facility] bunion,bone spur, tendon (1) -6'16, (1)-10'16   HEMATOMA EVACUATION N/A 01/05/2022   Procedure: Cervical Wound Exploration;  Surgeon: Gillie Duncans, MD;  Location: [facility] OR;  Service: Neurosurgery;  Laterality: N/A;   IR EPIDUROGRAPHY  07/21/2018   LUMBAR LAMINECTOMY/DECOMPRESSION MICRODISCECTOMY Bilateral 12/28/2015   Procedure: MICRO LUMBAR DECOMPRESSION L4 -  L5 BILATERALLY;  Surgeon: Reyes Billing, MD;  Location: [facility] ORS;  Service: Orthopedics;  Laterality: Bilateral;   LUMBAR LAMINECTOMY/DECOMPRESSION MICRODISCECTOMY Bilateral 03/04/2018   Procedure: Revision of Microlumbar Decompression Bilateral Lumbar Four-Five;  Surgeon: Billing Reyes, MD;  Location: [facility] OR;  Service: Orthopedics;  Laterality: Bilateral;  90 mins   LUMBAR WOUND DEBRIDEMENT N/A 12/25/2023   Procedure: Irrigation and debridement of lumbar wound;  Surgeon: Joshua Alm Hamilton, MD;  Location: [facility] OR;  Service: Neurosurgery;  Laterality: N/A;   POSTERIOR FUSION PEDICLE SCREW PLACEMENT N/A 12/07/2023   Procedure: LUMBAR FOUR-FIVE POSTERIOR LATERAL FUSION;  Surgeon: Joshua Alm Hamilton, MD;  Location: [facility] OR;  Service:  Neurosurgery;  Laterality: N/A;   SAVORY DILATION N/A 12/16/2020   Procedure: SAVORY DILATION;  Surgeon: Aneita Gwendlyn DASEN, MD;  Location: [facility] ENDOSCOPY;  Service: Endoscopy;  Laterality: N/A;   SPINAL CORD STIMULATOR INSERTION N/A 09/28/2019   Procedure: THORACIC SPINAL CORD STIMULATOR INSERTION;  Surgeon: Burnetta Aures, MD;  Location: [facility] OR;  Service: Orthopedics;  Laterality: N/A;  2.5 hrs   SPINAL CORD STIMULATOR REMOVAL N/A 05/27/2021   Procedure: LUMBAR SPINAL CORD STIMULATOR REMOVAL;  Surgeon: Bluford Standing, MD;  Location: [facility] ORS;  Service: Neurosurgery;  Laterality: N/A;   TUBAL LIGATION     WISDOM TOOTH EXTRACTION     WOUND EXPLORATION N/A 03/04/2018   Procedure: EXPLORATION OF LUMBAR DECOMPRESSION WOUND;  Surgeon: Billing Reyes, MD;  Location: [facility] OR;  Service: Orthopedics;  Laterality: N/A;   Patient Active Problem List   Diagnosis Date Noted   Vitamin D  deficiency, unspecified 04/13/2024   Seasonal allergic rhinitis due to pollen 01/29/2024   Delayed wound healing 12/25/2023   S/P lumbar fusion 12/07/2023   Pain in thumb joint with movement of right hand 08/12/2023   Localized primary osteoarthritis of carpometacarpal joint of right thumb 07/29/2023   Right foot drop 07/29/2023   Frequent falls 04/14/2023   Gastroesophageal reflux disease 04/14/2023   Right upper extremity numbness 09/29/2022   Stage 3a chronic kidney disease (HCC) 09/29/2022   Cervical spondylosis with radiculopathy 01/05/2022   S/P cervical spinal fusion 01/03/2022   Cervical disc disorder with radiculopathy of cervical region 11/27/2021   Uncontrolled pain 05/27/2021   Syncope 05/13/2021   Depression with anxiety 05/13/2021   Chronic diastolic CHF (congestive heart failure) (HCC) 05/13/2021   Chronic back pain 05/13/2021   Chest tightness 05/13/2021   Lower abdominal pain 05/13/2021   Syncope and collapse 01/01/2021   Abnormal echocardiogram 01/01/2021   BPPV (benign paroxysmal positional vertigo),  left 12/26/2020   Odynophagia    Dysphagia 12/15/2020   Postural dizziness with presyncope 12/14/2020   Lumbar post-laminectomy syndrome    Chronic pain 09/28/2019   Lumbar spinal stenosis    Radicular pain    Gait disorder    Difficulty in urination 07/13/2018   Back pain 07/12/2018   Hyperlipidemia 04/16/2018   GAD (generalized anxiety disorder) 04/16/2018   AKI (acute kidney injury)    Benign essential HTN    Major depression, recurrent    Lumbar radiculopathy 03/09/2018   Paraparesis (HCC) 03/09/2018   Essential hypertension    Chronic nonintractable headache    Leukocytosis    Acute blood loss anemia    Postoperative pain    Generalized weakness    Spinal stenosis at L4-L5 level 03/04/2018   Spinal stenosis of lumbar region 12/28/2015   Chest pain 04/16/2015    ONSET DATE: Most recent Surgery date 12-07-23:  Referral date 02-03-24  REFERRING DIAG:  M54.16 (ICD-10-CM) - Lumbar radiculopathy M96.1 (ICD-10-CM) - Lumbar post-laminectomy syndrome R26.9 (ICD-10-CM) - Gait disorder  THERAPY DIAG:  Other low back pain  Muscle weakness (generalized)  Rationale for Evaluation and Treatment: Rehabilitation  SUBJECTIVE:                                                                                                                                                                                             SUBJECTIVE STATEMENT: Pt reports she is still having HA's - having trouble recovering from Covid - cancelled appt with Dr. Joshua last Thurs. due to not feeling well;  appt has been rescheduled Pt accompanied by: self  PERTINENT HISTORY: The patient was admitted on 12/07/2023 &  underwent PLIF L4-5. Hospitalized 12-07-23 - 12-11-23:  The patient was admitted on 12/25/2023 and taken to the operating room where the patient underwent irrigation and debridement of lumbar wound with primary closure and placement of external wound VAC- hospitalized 12-25-23 -  12-26-23  Frequent falls:   Spinal cord stimulator insertion on 09-28-19; stimulator removal on 05-27-21 with resultant LLE weakness;  decompression and laminectomy on 12/28/15 at L4-L5  with redo decompression surgery on May 9,2019, post-op paraparesis with surgery to remove small hematoma; 07/12/18 fall at work (involved rolling chair) with hospital admission, transfer to CIR with d/c home 08/03/18; HTN, Bil foot surgery; left ventricular hypertrophy, chronic pain syndrome, dyspnea, hypertrophic cardiomyopathy; cervical fusion March 2023;  had carpal tunnel surgery RUE in April 2024:  LVH  PAIN:  Are you having pain? Pt rates pain 6-7/10 in lumbar region - greater pain in lower lumbar region near sacral area- same burning sensation Alleviating Factor:  placing pillow behind back when sitting Aggravating Factors: certain movements    PRECAUTIONS: Fall; No bending/lifting or twisting; pt states she has a reacher  WEIGHT BEARING RESTRICTIONS: No  FALLS: Has patient fallen in last 6 months? Yes. Number of falls 3  LIVING ENVIRONMENT: Lives with: lives with their spouse Lives in: House/apartment Stairs: Yes: External: 5 steps; on right going up Has following equipment at home: Single point cane, Wheelchair (manual), and L[place]strand crutch  PLOF: Independent with basic ADLs, Independent with household mobility with device, Independent with community mobility with device, and Independent with transfers  PATIENT GOALS: improve walking and strength Rt leg  OBJECTIVE:   DIAGNOSTIC FINDINGS: IMPRESSION: 1. Interval anterior discectomy and fusion from C4 through C6 with improved patency of the spinal canal and only mild residual foraminal narrowing. 2. Stable mild spinal stenosis and mild to moderate left foraminal narrowing at C3-4. 3. Stable mild spinal stenosis and mild foraminal narrowing bilaterally at C6-7.  Mild foraminal narrowing bilaterally at C7-T1. 4. No cord deformity or abnormal cord signal.  No acute findings. No  recent MRI of lumbar region  COGNITION: Overall cognitive status: Within functional limits for tasks assessed   SENSATION: WFL  COORDINATION: Decreased RLE due to weakness and spasticity  POSTURE: No Significant postural limitations  LOWER EXTREMITY ROM:   LLE WFL's;  RLE WFL's except for ankle ROM which is minimal due to weakness; Pt has difficulty performing Rt knee extension due to Rt quad weakness and spasticity - turned body slightly to Lt to achieve more knee extension   LOWER EXTREMITY MMT:    MMT Right Eval Left Eval  Hip flexion 3- 3+   Hip extension    Hip abduction    Hip adduction    Hip internal rotation    Hip external rotation    Knee flexion 3+   Knee extension 4- 4  Ankle dorsiflexion 2-   Ankle plantarflexion    Ankle inversion    Ankle eversion    (Blank rows = not tested)  BED MOBILITY:  Modified independent  TRANSFERS: Assistive device utilized: Rollator  Sit to stand: Modified independence Stand to sit: Modified independence   STAIRS:  Not tested due to c/o back pain   GAIT: Gait pattern: decreased step length- Right, decreased stance time- Right, decreased hip/knee flexion- Right, and decreased ankle dorsiflexion- Right Distance walked: 50' Assistive device utilized: with rollator Level of assistance: SBA and CGA Comments: slow gait speed due to c/o back pain and RLE weakness  FUNCTIONAL TESTS:  Sit to stand score - 2 reps - with bil. UE support from chair- 29.91 secs             TUG score: - 49.19 secs with rollator  Gait velocity:  37.38 secs = .88 ft/sec with rollator  PATIENT SURVEYS:  N/A - based on diagnosis of RLE weakness due to chronic lumbar radiculopathy   TODAY'S TREATMENT: 07-26-24                                                                                                                           Gait: Pt using rollator and wearing Blue rocker AFO on RLE - pt modified independent with use of rollator; ambulating  very slowly due to c/o low back pain  Pt amb. Clinic distances with rollator with supervision   TherEx:  Pt performed following exercises in supine position to reduce back pain and to increase strength in low back musc. and ROM of lumbar spine  Pelvic tilt 10 reps 5 sec Pelvic tilt with RLE heel slide with extension 10 reps;  LLE hip flexion/extension in hooklying position -  10 reps with pelvic tilt In hooklying position - Rt hip flexion 5 reps x 2 sets:  LLE 10 reps - pelvic tilt prior to flexing hip Glut squeezes 10 reps - 3 sec hold SAQ's 10 reps 3 sec hold - bil. LE's on bolster  SciFit -  level 2.0 x 5 with bil. Ue's and LE's - ice pack placed behind back during this exercise for pain relief  Medbridge:   Access Code: [email or web address] URL: [email or web address] Date: 05/23/2024 Prepared by: Rock Kussmaul  Exercises - Supine Posterior Pelvic Tilt  - 1 x daily - 7 x weekly - 1 sets - 10 reps - 5 sec hold - Supine Heel Slides  - 1 x daily - 7 x weekly - 1 sets - 10 reps - Lifting leg up/down in this position  - 1 x daily - 7 x weekly - 1 sets - 10 reps - Supine Lower Trunk Rotation  - 1 x daily - 7 x weekly - 1 sets - 5 reps - Supine Bridge with Pelvic Floor Contraction  - 1 x daily - 7 x weekly - 1 sets - 5 reps - 3 sec hold   UPDATED HEP: Access Code: [email or web address] URL: [email or web address] Date: 04/27/2024 Prepared by: Rock Kussmaul  Exercises - Seated Knee Extension with Resistance  - 1 x daily - 7 x weekly - 3 sets - 10 reps - TAP UPS - with Left leg   - 1 x daily - 7 x weekly - 3 sets - 10 reps - Standing March with Counter Support  - 1 x daily - 7 x weekly - 3 sets - 10 reps - Forward Step Up with Counter Support  - 1 x daily - 7 x weekly - 3 sets - 10 reps - Alternating Step Forward with Support  - 1 x daily - 7 x weekly - 3 sets - 10 reps   PATIENT EDUCATION: Education details:  Medbridge  Person educated: Patient Education method: Explanation  - handout declined as pt stated she has it from previous OP admission Education comprehension: verbalized understanding  HOME EXERCISE PROGRAM:   GOALS: Goals reviewed with patient? Yes    UPDATED LONG TERM GOALS: Target date: 06-10-24   Amb. 350' with RW with SBA without requiring seated rest period. Baseline: 134' x 1, 87' x 1 rep - 04-19-24;  measure lobby Goal status: Not assessed on 06-28-24 due to c/o increased low back pain  2.  Improve TUG score to </= 22 secs  with use of RW for improved mobility and reduced fall risk. Baseline: 49.19 secs with rollator;  28.5 secs with rollator - 04-19-24;  43.47 secs with rollator - 06-28-24 Goal status:  Ongoing  3.  Perform 5x sit to stand transfers with LUE support only to demo improved strength in RLE IN </= 25 secs to demo improved functional mobility. Baseline: 36.16 secs - 4x with bil. UE support from chair - had to stop due to burning sensation in low back;  42.97 secs 06-28-24 Goal status:  Ongoing  4.  Increase gait velocity to >/= 1.6 ft/sec with rollator for increased gait efficiency with reduced fall risk.          Baseline: 37.38 secs = .88 ft/sec with rollator;  33.44 secs = .98 ft/sec with rollator;  36.22, 38.09 secs          Goal status:  ONGOING   6.  Independent in updated HEP for RLE strengthening. Baseline:  Goal status:  Goal met 06-09-24   7.  Report reduced low back pain at rest to </= 3/10 intensity.            Baseline:  7/10;    04-20-24 -- 5/10;    5/10 = 06-09-24;  06-28-24 = 8/10 intensity  Goal status: Not met 06-09-24; not met 06-28-24   UPDATED SHORT TERM GOALS:  TARGET DATE 07-22-24:  1.  Amb. 230' with RW with SBA without requiring seated rest period.             Baseline: 134' x 1, 87' x 1 rep on 04-19-24;  approx. 100' from lobby to mat table in gym with rollator on 06-28-24     Goal status:  Ongoing  2.  Improve TUG score to </= 30 secs  with use of RW for improved mobility and reduced fall  risk. Baseline: 49.19 secs with rollator;  28.5 secs with rollator; 43.47 secs on 06-28-24 Goal status:  Revised  3.  Perform 5x sit to stand from chair with bil. UE support in </= 30 secs to demonstrate improved mobility.     Baseline:  36.16 secs - 4 reps with bil. UE support from chair:  42.97 secs 06-28-24    Goal status:  Ongoing     4.  Pt will report reduced pain in low back to </= 5/10 intensity for increased ease with ADL's and mobility.    Baseline:  5/10 on 04-19-24:  8/10 on 06-28-24    Goal status:  Revised   UPDATED LONG TERM GOALS: Target date: 08-19-24   Amb. 350' with RW with SBA without requiring seated rest period. Baseline: 134' x 1, 87' x 1 rep - 04-19-24;  measure lobby Goal status: Not assessed on 06-28-24 due to c/o increased low back pain; Ongoing goal  2.  Improve TUG score to </= 25 secs  with use of RW for improved mobility and reduced fall risk. Baseline: 49.19 secs with rollator;  28.5 secs with rollator - 04-19-24;  43.47 secs with rollator - 06-28-24 Goal status:  Revised   3.  Perform 5x sit to stand transfers with LUE support only to demo improved strength in RLE IN </= 25 secs to demo improved functional mobility. Baseline: 36.16 secs - 4x with bil. UE support from chair - had to stop due to burning sensation in low back;  42.97 secs 06-28-24 Goal status:  Ongoing  4.  Increase gait velocity to >/= 1.6 ft/sec with rollator for increased gait efficiency with reduced fall risk.          Baseline: 37.38 secs = .88 ft/sec with rollator;  33.44 secs = .98 ft/sec with rollator; 06-28-24 =  36.22, 38.09 secs          Goal status:  ONGOING   6.  Independent in revised/updated HEP for RLE strengthening. Baseline:  Goal status: Revised   7.  Report reduced low back pain at rest to </= 3/10 intensity.            Baseline:  7/10;    04-20-24 -- 5/10;    5/10 = 06-09-24;  06-28-24 = 8/10 intensity            Goal status: Ongoing   ASSESSMENT:   CLINICAL IMPRESSION: PT  session focused on exercises for lumbar stabilization and low back strengthening.  Pt rated pain 6-7/10 at start of session and 5-6/10 at end of session.  Pt continues to report not feeling well due to residual effects of Covid (contracted end of August).  Pt is progressing slowly towards goals as pt continues to have low back pain and RLE weakness.  Cont with POC.   OBJECTIVE IMPAIRMENTS: Abnormal gait, decreased balance, decreased coordination, decreased endurance, decreased strength, and impaired tone.  ACTIVITY LIMITATIONS:  carrying, bending, squatting, stairs, transfers, and locomotion level  PARTICIPATION LIMITATIONS: cleaning, laundry, shopping, and community activity  PERSONAL FACTORS: Behavior pattern, Past/current experiences, and Time since onset of injury/illness/exacerbation are also affecting patient's functional outcome.   REHAB POTENTIAL: Good  CLINICAL DECISION MAKING: Evolving/moderate complexity  EVALUATION COMPLEXITY: Moderate  PLAN:  PT FREQUENCY: 2x/week  PT DURATION: 8 weeks  PLANNED INTERVENTIONS: Therapeutic exercises, Therapeutic activity, Neuromuscular re-education, Balance training, Gait training, Patient/Family education, Self Care, Stair training, Orthotic/Fit training, DME instructions, and Aquatic Therapy  PLAN FOR NEXT SESSION:  check STG's; Continue with low back stretching; ball exercises; pelvic rocking if tolerated    Giannamarie Paulus, [place], PT 07/26/2024, 8:56 PM

## 2024-07-27 ENCOUNTER — Ambulatory Visit: Admitting: Physical Medicine & Rehabilitation

## 2024-07-28 ENCOUNTER — Other Ambulatory Visit: Payer: Self-pay

## 2024-07-28 ENCOUNTER — Ambulatory Visit: Payer: Self-pay | Admitting: Physical Therapy

## 2024-07-28 ENCOUNTER — Other Ambulatory Visit (HOSPITAL_COMMUNITY): Payer: Self-pay

## 2024-07-28 NOTE — Patient Outreach (Signed)
 Complex Care Management   Visit Note  07/28/2024  Name:  [name] MRN: [id number] DOB: 08/08/1961  Situation: Referral received for Complex Care Management related to Acute/Chronic Back Pain r/t Spinal Stenosis and Disc problems and Fall Prevention I obtained verbal consent from Patient.  Visit completed with Patient  on the phone  Background:   Past Medical History:  Diagnosis Date   Anxiety    Aortic atherosclerosis 04/16/2021   Atypical angina    Back pain    related to spinal stenosis and disc problem, radiates down left buttocks to leg., weakness occ.   Chest pain    a. 03/2015 Cath: nl cors; b. 03/2021 Cor CTA: Ca2+ = 0. Nl Cors.   Chronic pain syndrome    Dyspnea    hx   GERD (gastroesophageal reflux disease)    Grade I diastolic dysfunction    Headache    Hyperlipidemia    Hypertension    Lumbar post-laminectomy syndrome    LVH (left ventricular hypertrophy) 12/15/2020   a. 11/2020 Echo: EF 65-70%, no rwma, sev asymm LVH with IVSd 1.9 cm. No LVOT obs @ rest. Gr1 DD. Triv MR.   PONV (postoperative nausea and vomiting)    Pulmonary nodules    a. 03/2021 CT Chest: 3mm pulm nodules in bilat lower lobes. F/u 1 yr.   Right foot drop    Syncope    a. 11/2020 Zio: No significant arrhythmias.   Vaginal foreign object    Uses Femring     Assessment: Patient Reported Symptoms:  Cognitive Cognitive Status: No symptoms reported      Neurological Neurological Review of Symptoms: No symptoms reported    HEENT HEENT Symptoms Reported: No symptoms reported      Cardiovascular Cardiovascular Symptoms Reported: No symptoms reported    Respiratory Respiratory Symptoms Reported: Dry cough Other Respiratory Symptoms: as of today, 07/28/24, still recovering slowly from severe bout of Covid in August 2025 - still experiencing fatigue Respiratory Management Strategies: Adequate rest, Medication therapy, Coping strategies Respiratory Self-Management Outcome: 4 (good)   Endocrine Endocrine Symptoms Reported: No symptoms reported    Gastrointestinal Gastrointestinal Symptoms Reported: No symptoms reported      Genitourinary Genitourinary Symptoms Reported: No symptoms reported    Integumentary Integumentary Symptoms Reported: No symptoms reported    Musculoskeletal Musculoskelatal Symptoms Reviewed: Back pain, Muscle pain, Unsteady gait Additional Musculoskeletal Details: Continues to see PT therapist Elvie as outpatient (same therapist since 2019) Musculoskeletal Management Strategies: Medical device, Medication therapy, Coping strategies, Adequate rest, Exercise, Routine screening Musculoskeletal Self-Management Outcome: 4 (good)      Psychosocial Psychosocial Symptoms Reported: No symptoms reported     Quality of Family Relationships: supportive, involved, helpful Do you feel physically threatened by others?: No    07/28/2024    PHQ2-9 Depression Screening   Little interest or pleasure in doing things Not at all  Feeling down, depressed, or hopeless Not at all  PHQ-2 - Total Score 0  Trouble falling or staying asleep, or sleeping too much    Feeling tired or having little energy    Poor appetite or overeating     Feeling bad about yourself - or that you are a failure or have let yourself or your family down    Trouble concentrating on things, such as reading the newspaper or watching television    Moving or speaking so slowly that other people could have noticed.  Or the opposite - being so fidgety or restless that you have been moving  around a lot more than usual    Thoughts that you would be better off dead, or hurting yourself in some way    PHQ2-9 Total Score    If you checked off any problems, how difficult have these problems made it for you to do your work, take care of things at home, or get along with other people    Depression Interventions/Treatment      There were no vitals filed for this visit.  Medications Reviewed Today      Reviewed by Gordy Channing LABOR, RN (Registered Nurse) on 07/28/24 at 1038  Med List Status: <None>   Medication Order Taking? Sig Documenting Provider Last Dose Status Informant  acetaminophen  (TYLENOL ) 500 MG tablet [id number] Yes Take 500 mg by mouth every 6 (six) hours as needed for headache. [provider]  Active Self, Pharmacy Records  ALPRAZolam  (XANAX ) 0.5 MG tablet [id number] Yes Take 1/2 - 1 tablet by mouth at bedtime as needed for anxiety. [place], [name], MD  Active Self, Pharmacy Records  amitriptyline  (ELAVIL ) 25 MG tablet [id number] Yes Take 1 tablet (25 mg total) by mouth daily.   Active Self, Pharmacy Records  amLODipine  (NORVASC ) 5 MG tablet [id number]  Take 1 tablet (5 mg total) by mouth daily. Mona Vinie BROCKS, MD  Expired 07/21/24 2359 Self, Pharmacy Records  atorvastatin  (LIPITOR) 20 MG tablet [id number] Yes Take 1 tablet (20 mg total) by mouth daily. [place], [name], MD  Active Self, Pharmacy Records  baclofen  (LIORESAL ) 10 MG tablet [id number] Yes Take 1 tablet (10 mg total) by mouth 3 (three) times daily as needed. Babs Arthea DASEN, MD  Active Self, Pharmacy Records           Med Note JERALYN, [place] A   Mon Nov 30, 2023  1:44 PM) Pt alternates takes baclofen  and methocarbamol    benzonatate  (TESSALON ) 100 MG capsule [id number] Yes Take 1 capsule (100 mg total) by mouth 3 (three) times daily. [place], [name], MD  Active   betamethasone  dipropionate 0.05 % cream [id number] Yes Apply as directed to skin once a day to itchy areas only, avoiding normal skin, as needed.   Active Self, Pharmacy Records  carvedilol  (COREG ) 25 MG tablet [id number] Yes Take 1 tablet (25 mg total) by mouth 2 (two) times daily with a meal. Vannie Reche RAMAN, NP  Active Self, Pharmacy Records  cholecalciferol  (VITAMIN D3) 25 MCG (1000 UNIT) tablet [id number] Yes Take 1,000 Units by mouth daily. [provider]  Active Self, Pharmacy Records  diphenhydrAMINE  (BENADRYL ) 25 MG tablet [id number]  Yes Take 25 mg by mouth daily as needed for itching. [provider]  Active Self, Pharmacy Records  docusate sodium  (COLACE) 100 MG capsule [id number]  Take 100 mg by mouth 2 (two) times daily.  Patient not taking: Reported on 07/28/2024   [provider]  Active Self, Pharmacy Records  DULoxetine  (CYMBALTA ) 30 MG capsule [id number] Yes Take 1 capsule (30 mg total) by mouth daily. Cheryle Page, MD  Active   Estradiol  Acetate (FEMRING ) 0.05 MG/24HR RING [id number] Yes USE ONE RING VAGINALLY EVERY 3 MONTHS AS DIRECTED   Active Self, Pharmacy Records  gabapentin  (NEURONTIN ) 600 MG tablet [id number] Yes Take 0.5 tablets (300 mg total) by mouth 3 (three) times daily. Cheryle Page, MD  Active   gabapentin  (NEURONTIN ) 600 MG tablet [id number] Yes Take 1 tablet (600 mg total) by mouth 3 (three) times daily. Babs Arthea DASEN, MD  Active   guaiFENesin  (MUCINEX ) 600 MG 12  hr tablet [id number]  Take 2 tablets (1,200 mg total) by mouth 2 (two) times daily as needed.  Patient not taking: Reported on 07/28/2024   Cheryle Page, MD  Active   hydrALAZINE  (APRESOLINE ) 50 MG tablet [id number] Yes Take 1 tablet (50 mg total) by mouth 2 (two) times daily. Cheryle Page, MD  Active     Discontinued 04/21/24 1712 (Allergic reaction)            Med Note>> Gordy Channing LABOR, RN   02/26/2024  9:13 AM Itching, Hives, Breathing Problems    meclizine  (ANTIVERT ) 25 MG tablet [id number] Yes Take 1 tablet (25 mg total) by mouth 3 (three) times daily as needed for dizziness. Cheryle Page, MD  Active   methocarbamol  (ROBAXIN ) 500 MG tablet [id number] Yes Take 1 tablet (500 mg total) by mouth 3 (three) times daily as needed for muscle spasm. Babs Arthea DASEN, MD  Active Self, Pharmacy Records  methocarbamol  (ROBAXIN ) 500 MG tablet [id number] Yes Take 1 tablet (500 mg total) by mouth 3 (three) times daily as needed for muscle spasms.   Active   methylPREDNISolone  (MEDROL  DOSEPAK) 4 MG TBPK tablet [id number]  Take as  directed per package instructions.   Active   mometasone  (NASONEX ) 50 MCG/ACT nasal spray [id number]  Place 2 sprays into the nose daily.  Patient not taking: Reported on 07/28/2024   [place], [name], MD  Active Self, Pharmacy Records    Discontinued 09/05/23 1325 (Patient Preference)            Med Note>> Sheela Duwaine CROME, CPhT   09/05/2023  1:25 PM non longer taking    tapentadol  (NUCYNTA ) 50 MG tablet [id number] Yes Take 1 tablet (50 mg total) by mouth every 6 (six) to 8 (eight) hours as needed. Babs Arthea DASEN, MD  Active Self, Pharmacy Records            Recommendation:   Continue Current Plan of Care  Follow Up Plan:   Telephone follow up appointment date/time:  08/25/2024 at 10 am with RN Care Manager Channing Channing A. Gordy RN, BA, [facility], CRRN [place]  [facility] Population Health RN Care Manager Direct Dial: [phone]  Fax: [phone]

## 2024-07-28 NOTE — Patient Instructions (Signed)
 Visit Information  Thank you for taking time to visit with me today. Please don't hesitate to contact me if I can be of assistance to you before our next scheduled telephone appointment.  Our next appointment is by telephone on 08/25/24 at 10 am  Following is a copy of your care plan:   Goals Addressed             This Visit's Progress    [facility] RN Care Plan       Problems:  Chronic Disease Management support and education needs related to Back Pain  Goal: Over the next 3 months the Patient will demonstrate Improved adherence to prescribed treatment plan for back pain as evidenced by attending outpatient PT as ordered, rate back pain of 5 or less. 02/26/24 televisit w/ RNCM, pain remains higher than desired, at 7 out of pain scale 0-10. On 5/16 televisit w/ RNCM, pain level was much improved at and at desired level of <5, rating pain at 4 out of pain scale 0-10. On 7/7 televisit w/ RNCM, pain level rated at 5-6, has follow up with Dr. Babs planned for 7/9 to further address post surgical back pain. During 7/21 RNCM appt, patient stated post-op pain has improved to desired level of <5, patient also reported that her Orthopedic Surgeon stated that she had extensive & complex back surgery and it was not unusual for full healing to take 8-10 months. Patient continues her twice weekly PT sessions with Physical Therapist Elvie Ipoijb who patient feels has helped tremendously with her post op progress.8/19/ RNCM appt - patient is presently recovering from a severe URI for which she sought tx from her PCP (neg Covid, neg Flu) and is foregoing PT treatment while she rests and recovers. 07/28/24 RNCM FU - Patient states she is doing okay but is still recovering slowly from recent COVID infection  Interventions:   Pain Interventions: Pain assessment performed Medications reviewed Reviewed provider established plan for pain management Discussed importance of adherence to all scheduled medical  appointments Counseled on the importance of reporting any/all new or changed pain symptoms or management strategies to pain management provider Discussed use of relaxation techniques and/or diversional activities to assist with pain reduction (distraction, imagery, relaxation, massage, acupressure, TENS, heat, and cold application Reviewed with patient prescribed pharmacological and nonpharmacological pain relief strategies Screening for signs and symptoms of depression related to chronic disease state  Assessed social determinant of health barriers  Patient Self-Care Activities:  Attend all scheduled provider appointments Call pharmacy for medication refills 3-7 days in advance of running out of medications Call provider office for new concerns or questions  Take medications as prescribed    Plan:  Telephone follow up appointment with care management team member scheduled for:  October 30 at 10:00am.             Patient verbalizes understanding of instructions and care plan provided today and agrees to view in MyChart. Active MyChart status and patient understanding of how to access instructions and care plan via MyChart confirmed with patient.     The patient has been provided with contact information for the care management team and has been advised to call with any health related questions or concerns.   Please call the care guide team at [phone] if you need to cancel or reschedule your appointment.   Please call 1-800-273-TALK (toll free, 24 hour hotline) if you are experiencing a Mental Health or Behavioral Health Crisis or need someone to talk to.  [name] A. Gordy RN, BA, [facility], CRRN [place]  [facility] Population Health RN Care Manager Direct Dial: [phone]  Fax: [phone]

## 2024-07-29 ENCOUNTER — Other Ambulatory Visit (HOSPITAL_COMMUNITY): Payer: Self-pay

## 2024-07-29 ENCOUNTER — Other Ambulatory Visit: Payer: Self-pay

## 2024-07-29 MED ORDER — BETAMETHASONE DIPROPIONATE 0.05 % EX CREA
TOPICAL_CREAM | CUTANEOUS | 1 refills | Status: DC
  Filled 2024-07-29: qty 45, 30d supply, fill #0
  Filled 2024-09-06: qty 45, 30d supply, fill #1

## 2024-08-02 ENCOUNTER — Ambulatory Visit: Payer: Self-pay | Attending: Family Medicine | Admitting: Physical Therapy

## 2024-08-02 DIAGNOSIS — R2689 Other abnormalities of gait and mobility: Secondary | ICD-10-CM | POA: Diagnosis not present

## 2024-08-02 DIAGNOSIS — M6281 Muscle weakness (generalized): Secondary | ICD-10-CM | POA: Insufficient documentation

## 2024-08-02 DIAGNOSIS — M5459 Other low back pain: Secondary | ICD-10-CM | POA: Insufficient documentation

## 2024-08-02 NOTE — Therapy (Unsigned)
 OUTPATIENT PHYSICAL THERAPY NEURO TREATMENT NOTE        Patient Name: [name] MRN: [id number] DOB:Nov 07, 1960, 63 y.o., female Today's Date: 06/30/2024   PCP: [place], [name]., MD REFERRING PROVIDER: Babs Arthea DASEN, MD  END OF SESSION:                            Past Medical History:  Diagnosis Date   Anxiety    Aortic atherosclerosis 04/16/2021   Atypical angina    Back pain    related to spinal stenosis and disc problem, radiates down left buttocks to leg., weakness occ.   Chest pain    a. 03/2015 Cath: nl cors; b. 03/2021 Cor CTA: Ca2+ = 0. Nl Cors.   Chronic pain syndrome    Dyspnea    hx   GERD (gastroesophageal reflux disease)    Grade I diastolic dysfunction    Headache    Hyperlipidemia    Hypertension    Lumbar post-laminectomy syndrome    LVH (left ventricular hypertrophy) 12/15/2020   a. 11/2020 Echo: EF 65-70%, no rwma, sev asymm LVH with IVSd 1.9 cm. No LVOT obs @ rest. Gr1 DD. Triv MR.   PONV (postoperative nausea and vomiting)    Pulmonary nodules    a. 03/2021 CT Chest: 3mm pulm nodules in bilat lower lobes. F/u 1 yr.   Right foot drop    Syncope    a. 11/2020 Zio: No significant arrhythmias.   Vaginal foreign object    Uses Femring    Past Surgical History:  Procedure Laterality Date   ABDOMINAL HYSTERECTOMY     ANTERIOR CERVICAL DECOMP/DISCECTOMY FUSION N/A 01/03/2022   Procedure: Anterior Cervical Decompression Fusion - Cervical four-Cervical five - Cervical five-Cervical six;  Surgeon: Joshua Alm RAMAN, MD;  Location: [facility] OR;  Service: Neurosurgery;  Laterality: N/A;   BIOPSY  12/16/2020   Procedure: BIOPSY;  Surgeon: Aneita Gwendlyn DASEN, MD;  Location: [facility] ENDOSCOPY;  Service: Endoscopy;;   CARDIAC CATHETERIZATION N/A 04/18/2015   Procedure: Left Heart Cath and Coronary Angiography;  Surgeon: Rober Chroman, MD;  Location: [facility] INVASIVE CV LAB;  Service: Cardiovascular;  Laterality: N/A;   COLONOSCOPY      COLONOSCOPY W/ BIOPSIES AND POLYPECTOMY  2018   ESOPHAGOGASTRODUODENOSCOPY (EGD) WITH PROPOFOL  N/A 12/16/2020   Procedure: ESOPHAGOGASTRODUODENOSCOPY (EGD) WITH PROPOFOL ;  Surgeon: Aneita Gwendlyn DASEN, MD;  Location: [facility] ENDOSCOPY;  Service: Endoscopy;  Laterality: N/A;   FOOT SURGERY Bilateral    [facility] bunion,bone spur, tendon (1) -6'16, (1)-10'16   HEMATOMA EVACUATION N/A 01/05/2022   Procedure: Cervical Wound Exploration;  Surgeon: Gillie Duncans, MD;  Location: [facility] OR;  Service: Neurosurgery;  Laterality: N/A;   IR EPIDUROGRAPHY  07/21/2018   LUMBAR LAMINECTOMY/DECOMPRESSION MICRODISCECTOMY Bilateral 12/28/2015   Procedure: MICRO LUMBAR DECOMPRESSION L4 - L5 BILATERALLY;  Surgeon: Reyes Billing, MD;  Location: [facility] ORS;  Service: Orthopedics;  Laterality: Bilateral;   LUMBAR LAMINECTOMY/DECOMPRESSION MICRODISCECTOMY Bilateral 03/04/2018   Procedure: Revision of Microlumbar Decompression Bilateral Lumbar Four-Five;  Surgeon: Billing Reyes, MD;  Location: [facility] OR;  Service: Orthopedics;  Laterality: Bilateral;  90 mins   LUMBAR WOUND DEBRIDEMENT N/A 12/25/2023   Procedure: Irrigation and debridement of lumbar wound;  Surgeon: Joshua Alm Hamilton, MD;  Location: [facility] OR;  Service: Neurosurgery;  Laterality: N/A;   POSTERIOR FUSION PEDICLE SCREW PLACEMENT N/A 12/07/2023   Procedure: LUMBAR FOUR-FIVE POSTERIOR LATERAL FUSION;  Surgeon: Joshua Alm Hamilton, MD;  Location: [facility] OR;  Service:  Neurosurgery;  Laterality: N/A;   SAVORY DILATION N/A 12/16/2020   Procedure: SAVORY DILATION;  Surgeon: Aneita Gwendlyn DASEN, MD;  Location: [facility] ENDOSCOPY;  Service: Endoscopy;  Laterality: N/A;   SPINAL CORD STIMULATOR INSERTION N/A 09/28/2019   Procedure: THORACIC SPINAL CORD STIMULATOR INSERTION;  Surgeon: Burnetta Aures, MD;  Location: [facility] OR;  Service: Orthopedics;  Laterality: N/A;  2.5 hrs   SPINAL CORD STIMULATOR REMOVAL N/A 05/27/2021   Procedure: LUMBAR SPINAL CORD STIMULATOR REMOVAL;  Surgeon: Bluford Standing,  MD;  Location: [facility] ORS;  Service: Neurosurgery;  Laterality: N/A;   TUBAL LIGATION     WISDOM TOOTH EXTRACTION     WOUND EXPLORATION N/A 03/04/2018   Procedure: EXPLORATION OF LUMBAR DECOMPRESSION WOUND;  Surgeon: Duwayne Purchase, MD;  Location: [facility] OR;  Service: Orthopedics;  Laterality: N/A;   Patient Active Problem List   Diagnosis Date Noted   Vitamin D  deficiency, unspecified 04/13/2024   Seasonal allergic rhinitis due to pollen 01/29/2024   Delayed wound healing 12/25/2023   S/P lumbar fusion 12/07/2023   Pain in thumb joint with movement of right hand 08/12/2023   Localized primary osteoarthritis of carpometacarpal joint of right thumb 07/29/2023   Right foot drop 07/29/2023   Frequent falls 04/14/2023   Gastroesophageal reflux disease 04/14/2023   Right upper extremity numbness 09/29/2022   Stage 3a chronic kidney disease (HCC) 09/29/2022   Cervical spondylosis with radiculopathy 01/05/2022   S/P cervical spinal fusion 01/03/2022   Cervical disc disorder with radiculopathy of cervical region 11/27/2021   Uncontrolled pain 05/27/2021   Syncope 05/13/2021   Depression with anxiety 05/13/2021   Chronic diastolic CHF (congestive heart failure) (HCC) 05/13/2021   Chronic back pain 05/13/2021   Chest tightness 05/13/2021   Lower abdominal pain 05/13/2021   Syncope and collapse 01/01/2021   Abnormal echocardiogram 01/01/2021   BPPV (benign paroxysmal positional vertigo), left 12/26/2020   Odynophagia    Dysphagia 12/15/2020   Postural dizziness with presyncope 12/14/2020   Lumbar post-laminectomy syndrome    Chronic pain 09/28/2019   Lumbar spinal stenosis    Radicular pain    Gait disorder    Difficulty in urination 07/13/2018   Back pain 07/12/2018   Hyperlipidemia 04/16/2018   GAD (generalized anxiety disorder) 04/16/2018   AKI (acute kidney injury)    Benign essential HTN    Major depression, recurrent    Lumbar radiculopathy 03/09/2018   Paraparesis (HCC)  03/09/2018   Essential hypertension    Chronic nonintractable headache    Leukocytosis    Acute blood loss anemia    Postoperative pain    Generalized weakness    Spinal stenosis at L4-L5 level 03/04/2018   Spinal stenosis of lumbar region 12/28/2015   Chest pain 04/16/2015    ONSET DATE: Most recent Surgery date 12-07-23:  Referral date 02-03-24  REFERRING DIAG:  M54.16 (ICD-10-CM) - Lumbar radiculopathy M96.1 (ICD-10-CM) - Lumbar post-laminectomy syndrome R26.9 (ICD-10-CM) - Gait disorder  THERAPY DIAG:  No diagnosis found.  Rationale for Evaluation and Treatment: Rehabilitation  SUBJECTIVE:  SUBJECTIVE STATEMENT: Pt reports she is still having HA's - having trouble recovering from Covid - cancelled appt with Dr. Joshua last Thurs. due to not feeling well;  appt has been rescheduled Pt accompanied by: self  PERTINENT HISTORY: The patient was admitted on 12/07/2023 &  underwent PLIF L4-5. Hospitalized 12-07-23 - 12-11-23:  The patient was admitted on 12/25/2023 and taken to the operating room where the patient underwent irrigation and debridement of lumbar wound with primary closure and placement of external wound VAC- hospitalized 12-25-23 -  12-26-23  Frequent falls:  Spinal cord stimulator insertion on 09-28-19; stimulator removal on 05-27-21 with resultant LLE weakness;  decompression and laminectomy on 12/28/15 at L4-L5  with redo decompression surgery on May 9,2019, post-op paraparesis with surgery to remove small hematoma; 07/12/18 fall at work (involved rolling chair) with hospital admission, transfer to CIR with d/c home 08/03/18; HTN, Bil foot surgery; left ventricular hypertrophy, chronic pain syndrome, dyspnea, hypertrophic cardiomyopathy; cervical fusion March 2023;  had carpal tunnel surgery RUE in April  2024:  LVH  PAIN:  Are you having pain? Pt rates pain 6-7/10 in lumbar region - greater pain in lower lumbar region near sacral area- same burning sensation Alleviating Factor:  placing pillow behind back when sitting Aggravating Factors: certain movements    PRECAUTIONS: Fall; No bending/lifting or twisting; pt states she has a reacher  WEIGHT BEARING RESTRICTIONS: No  FALLS: Has patient fallen in last 6 months? Yes. Number of falls 3  LIVING ENVIRONMENT: Lives with: lives with their spouse Lives in: House/apartment Stairs: Yes: External: 5 steps; on right going up Has following equipment at home: Single point cane, Wheelchair (manual), and L[place]strand crutch  PLOF: Independent with basic ADLs, Independent with household mobility with device, Independent with community mobility with device, and Independent with transfers  PATIENT GOALS: improve walking and strength Rt leg  OBJECTIVE:   DIAGNOSTIC FINDINGS: IMPRESSION: 1. Interval anterior discectomy and fusion from C4 through C6 with improved patency of the spinal canal and only mild residual foraminal narrowing. 2. Stable mild spinal stenosis and mild to moderate left foraminal narrowing at C3-4. 3. Stable mild spinal stenosis and mild foraminal narrowing bilaterally at C6-7. Mild foraminal narrowing bilaterally at C7-T1. 4. No cord deformity or abnormal cord signal.  No acute findings. No recent MRI of lumbar region  COGNITION: Overall cognitive status: Within functional limits for tasks assessed   SENSATION: WFL  COORDINATION: Decreased RLE due to weakness and spasticity  POSTURE: No Significant postural limitations  LOWER EXTREMITY ROM:   LLE WFL's;  RLE WFL's except for ankle ROM which is minimal due to weakness; Pt has difficulty performing Rt knee extension due to Rt quad weakness and spasticity - turned body slightly to Lt to achieve more knee extension   LOWER EXTREMITY MMT:    MMT Right Eval Left Eval   Hip flexion 3- 3+   Hip extension    Hip abduction    Hip adduction    Hip internal rotation    Hip external rotation    Knee flexion 3+   Knee extension 4- 4  Ankle dorsiflexion 2-   Ankle plantarflexion    Ankle inversion    Ankle eversion    (Blank rows = not tested)  BED MOBILITY:  Modified independent  TRANSFERS: Assistive device utilized: Rollator  Sit to stand: Modified independence Stand to sit: Modified independence   STAIRS:  Not tested due to c/o back pain   GAIT: Gait pattern: decreased step length-  Right, decreased stance time- Right, decreased hip/knee flexion- Right, and decreased ankle dorsiflexion- Right Distance walked: 68' Assistive device utilized: with rollator Level of assistance: SBA and CGA Comments: slow gait speed due to c/o back pain and RLE weakness  FUNCTIONAL TESTS:  Sit to stand score - 2 reps - with bil. UE support from chair- 29.91 secs             TUG score: - 49.19 secs with rollator  Gait velocity:  37.38 secs = .88 ft/sec with rollator  PATIENT SURVEYS:  N/A - based on diagnosis of RLE weakness due to chronic lumbar radiculopathy   TODAY'S TREATMENT: 07-26-24                                                                                                                           Gait: Pt using rollator and wearing Blue rocker AFO on RLE - pt modified independent with use of rollator; ambulating very slowly due to c/o low back pain  Pt amb. Clinic distances with rollator with supervision   TherEx:  Pt performed following exercises in supine position to reduce back pain and to increase strength in low back musc. and ROM of lumbar spine  Pelvic tilt 10 reps 5 sec Pelvic tilt with RLE heel slide with extension 10 reps;  LLE hip flexion/extension in hooklying position -  10 reps with pelvic tilt In hooklying position - Rt hip flexion 5 reps x 2 sets:  LLE 10 reps - pelvic tilt prior to flexing hip Glut squeezes 10 reps - 3 sec  hold SAQ's 10 reps 3 sec hold - bil. LE's on bolster  SciFit - level 2.0 x 5 with bil. Ue's and LE's - ice pack placed behind back during this exercise for pain relief ==================== TUG score:   Pelvic tilt 10 reps 5 sec Pelvic tilt with RLE heel slide with extension 10 reps- no weight;  LLE 3# 10 reps with pelvic tilt Hip abdct. 7 reps 3# LLE; 3 reps without Glut squeezes 10 reps - 3-4 sec hold SAQ's 10 reps 5 sec hold - bil. LE's on bolster In hooklying position - Rt hip flexion 5 reps x 2 sets:  LLE 5 reps x 2 sets - pelvic tilt prior to flexing hip  Ice pack to low back in chair   Medbridge:   Access Code: [email or web address] URL: [email or web address] Date: 05/23/2024 Prepared by: Rock Kussmaul  Exercises - Supine Posterior Pelvic Tilt  - 1 x daily - 7 x weekly - 1 sets - 10 reps - 5 sec hold - Supine Heel Slides  - 1 x daily - 7 x weekly - 1 sets - 10 reps - Lifting leg up/down in this position  - 1 x daily - 7 x weekly - 1 sets - 10 reps - Supine Lower Trunk Rotation  - 1 x daily - 7 x weekly - 1 sets - 5 reps - Supine Bridge with Pelvic Floor Contraction  -  1 x daily - 7 x weekly - 1 sets - 5 reps - 3 sec hold =============================    UPDATED HEP: Access Code: [email or web address] URL: [email or web address] Date: 04/27/2024 Prepared by: Rock Kussmaul  Exercises - Seated Knee Extension with Resistance  - 1 x daily - 7 x weekly - 3 sets - 10 reps - TAP UPS - with Left leg   - 1 x daily - 7 x weekly - 3 sets - 10 reps - Standing March with Counter Support  - 1 x daily - 7 x weekly - 3 sets - 10 reps - Forward Step Up with Counter Support  - 1 x daily - 7 x weekly - 3 sets - 10 reps - Alternating Step Forward with Support  - 1 x daily - 7 x weekly - 3 sets - 10 reps   PATIENT EDUCATION: Education details:  Medbridge  Person educated: Patient Education method: Explanation - handout declined as pt stated she has it from previous OP  admission Education comprehension: verbalized understanding  HOME EXERCISE PROGRAM:   GOALS: Goals reviewed with patient? Yes    UPDATED LONG TERM GOALS: Target date: 06-10-24   Amb. 350' with RW with SBA without requiring seated rest period. Baseline: 134' x 1, 87' x 1 rep - 04-19-24;  measure lobby Goal status: Not assessed on 06-28-24 due to c/o increased low back pain  2.  Improve TUG score to </= 22 secs  with use of RW for improved mobility and reduced fall risk. Baseline: 49.19 secs with rollator;  28.5 secs with rollator - 04-19-24;  43.47 secs with rollator - 06-28-24 Goal status:  Ongoing  3.  Perform 5x sit to stand transfers with LUE support only to demo improved strength in RLE IN </= 25 secs to demo improved functional mobility. Baseline: 36.16 secs - 4x with bil. UE support from chair - had to stop due to burning sensation in low back;  42.97 secs 06-28-24 Goal status:  Ongoing  4.  Increase gait velocity to >/= 1.6 ft/sec with rollator for increased gait efficiency with reduced fall risk.          Baseline: 37.38 secs = .88 ft/sec with rollator;  33.44 secs = .98 ft/sec with rollator;  36.22, 38.09 secs          Goal status:  ONGOING   6.  Independent in updated HEP for RLE strengthening. Baseline:  Goal status:  Goal met 06-09-24   7.  Report reduced low back pain at rest to </= 3/10 intensity.            Baseline:  7/10;    04-20-24 -- 5/10;    5/10 = 06-09-24;  06-28-24 = 8/10 intensity            Goal status: Not met 06-09-24; not met 06-28-24   UPDATED SHORT TERM GOALS:  TARGET DATE 07-22-24:  1.  Amb. 230' with RW with SBA without requiring seated rest period.             Baseline: 134' x 1, 87' x 1 rep on 04-19-24;  approx. 100' from lobby to mat table in gym with rollator on 06-28-24     Goal status:  Ongoing;  NT  2.  Improve TUG score to </= 30 secs  with use of RW for improved mobility and reduced fall risk. Baseline: 49.19 secs with rollator;  28.5 secs with  rollator; 43.47 secs on 06-28-24;  36.63 secs Goal status:  Revised  3.  Perform 5x sit to stand from chair with bil. UE support in </= 30 secs to demonstrate improved mobility.     Baseline:  36.16 secs - 4 reps with bil. UE support from chair:  42.97 secs 06-28-24    Goal status:  Ongoing;  NT    4.  Pt will report reduced pain in low back to </= 5/10 intensity for increased ease with ADL's and mobility.    Baseline:  5/10 on 04-19-24:  8/10 on 06-28-24;  6-7/10 on 08-02-24    Goal status:  Revised   UPDATED LONG TERM GOALS: Target date: 08-19-24   Amb. 350' with RW with SBA without requiring seated rest period. Baseline: 134' x 1, 87' x 1 rep - 04-19-24;  measure lobby Goal status: Not assessed on 06-28-24 due to c/o increased low back pain; Ongoing goal  2.  Improve TUG score to </= 25 secs  with use of RW for improved mobility and reduced fall risk. Baseline: 49.19 secs with rollator;  28.5 secs with rollator - 04-19-24;  43.47 secs with rollator - 06-28-24 Goal status:  Revised   3.  Perform 5x sit to stand transfers with LUE support only to demo improved strength in RLE IN </= 25 secs to demo improved functional mobility. Baseline: 36.16 secs - 4x with bil. UE support from chair - had to stop due to burning sensation in low back;  42.97 secs 06-28-24 Goal status:  Ongoing  4.  Increase gait velocity to >/= 1.6 ft/sec with rollator for increased gait efficiency with reduced fall risk.          Baseline: 37.38 secs = .88 ft/sec with rollator;  33.44 secs = .98 ft/sec with rollator; 06-28-24 =  36.22, 38.09 secs          Goal status:  ONGOING   6.  Independent in revised/updated HEP for RLE strengthening. Baseline:  Goal status: Revised   7.  Report reduced low back pain at rest to </= 3/10 intensity.            Baseline:  7/10;    04-20-24 -- 5/10;    5/10 = 06-09-24;  06-28-24 = 8/10 intensity            Goal status: Ongoing   ASSESSMENT:   CLINICAL IMPRESSION: PT session focused on  exercises for lumbar stabilization and low back strengthening.  Pt rated pain 6-7/10 at start of session and 5-6/10 at end of session.  Pt continues to report not feeling well due to residual effects of Covid (contracted end of August).  Pt is progressing slowly towards goals as pt continues to have low back pain and RLE weakness.  Cont with POC.   OBJECTIVE IMPAIRMENTS: Abnormal gait, decreased balance, decreased coordination, decreased endurance, decreased strength, and impaired tone.  ACTIVITY LIMITATIONS: carrying, bending, squatting, stairs, transfers, and locomotion level  PARTICIPATION LIMITATIONS: cleaning, laundry, shopping, and community activity  PERSONAL FACTORS: Behavior pattern, Past/current experiences, and Time since onset of injury/illness/exacerbation are also affecting patient's functional outcome.   REHAB POTENTIAL: Good  CLINICAL DECISION MAKING: Evolving/moderate complexity  EVALUATION COMPLEXITY: Moderate  PLAN:  PT FREQUENCY: 2x/week  PT DURATION: 8 weeks  PLANNED INTERVENTIONS: Therapeutic exercises, Therapeutic activity, Neuromuscular re-education, Balance training, Gait training, Patient/Family education, Self Care, Stair training, Orthotic/Fit training, DME instructions, and Aquatic Therapy  PLAN FOR NEXT SESSION:  check STG's; Continue with low back stretching; ball exercises; pelvic rocking if tolerated    Aarika Moon,  [place], PT 08/02/2024, 9:36 AM

## 2024-08-03 ENCOUNTER — Encounter: Payer: Self-pay | Admitting: Physical Therapy

## 2024-08-08 DIAGNOSIS — L2989 Other pruritus: Secondary | ICD-10-CM | POA: Diagnosis not present

## 2024-08-09 ENCOUNTER — Other Ambulatory Visit (HOSPITAL_COMMUNITY): Payer: Self-pay

## 2024-08-09 ENCOUNTER — Ambulatory Visit: Payer: Self-pay | Admitting: Physical Therapy

## 2024-08-09 ENCOUNTER — Ambulatory Visit (INDEPENDENT_AMBULATORY_CARE_PROVIDER_SITE_OTHER): Admitting: Family Medicine

## 2024-08-09 ENCOUNTER — Other Ambulatory Visit: Payer: Self-pay | Admitting: Family Medicine

## 2024-08-09 ENCOUNTER — Other Ambulatory Visit: Payer: Self-pay

## 2024-08-09 ENCOUNTER — Encounter: Payer: Self-pay | Admitting: Family Medicine

## 2024-08-09 VITALS — BP 124/80 | HR 84 | Temp 97.9°F | Resp 16 | Ht 65.0 in | Wt 162.0 lb

## 2024-08-09 DIAGNOSIS — K219 Gastro-esophageal reflux disease without esophagitis: Secondary | ICD-10-CM

## 2024-08-09 DIAGNOSIS — M5459 Other low back pain: Secondary | ICD-10-CM

## 2024-08-09 DIAGNOSIS — R519 Headache, unspecified: Secondary | ICD-10-CM | POA: Diagnosis not present

## 2024-08-09 DIAGNOSIS — R053 Chronic cough: Secondary | ICD-10-CM | POA: Diagnosis not present

## 2024-08-09 DIAGNOSIS — E785 Hyperlipidemia, unspecified: Secondary | ICD-10-CM

## 2024-08-09 DIAGNOSIS — M6281 Muscle weakness (generalized): Secondary | ICD-10-CM

## 2024-08-09 MED ORDER — HYDROCODONE BIT-HOMATROP MBR 5-1.5 MG/5ML PO SOLN
5.0000 mL | Freq: Two times a day (BID) | ORAL | 0 refills | Status: AC | PRN
  Filled 2024-08-09: qty 80, 8d supply, fill #0

## 2024-08-09 MED ORDER — DOXYCYCLINE HYCLATE 100 MG PO TABS
100.0000 mg | ORAL_TABLET | Freq: Two times a day (BID) | ORAL | 0 refills | Status: AC
  Filled 2024-08-09: qty 14, 7d supply, fill #0

## 2024-08-09 MED ORDER — ATORVASTATIN CALCIUM 20 MG PO TABS
20.0000 mg | ORAL_TABLET | Freq: Every day | ORAL | 2 refills | Status: AC
  Filled 2024-08-09: qty 90, 90d supply, fill #0
  Filled 2024-11-07: qty 90, 90d supply, fill #1

## 2024-08-09 NOTE — Patient Instructions (Addendum)
 A few things to remember from today's visit:  Cough, persistent - Plan: Ambulatory referral to Pulmonology Take cough syrup at bedtime, 5 ml. Pulmonology appt will be arranged. Doxycycline  2 times daily for 7 days to treat for possible infectious process. Monitor for new symptoms.  If you need refills for medications you take chronically, please call your pharmacy. Do not use My Chart to request refills or for acute issues that need immediate attention. If you send a my chart message, it may take a few days to be addressed, specially if I am not in the office.  Please be sure medication list is accurate. If a new problem present, please set up appointment sooner than planned today.

## 2024-08-09 NOTE — Therapy (Unsigned)
 OUTPATIENT PHYSICAL THERAPY NEURO TREATMENT NOTE        Patient Name: [name] MRN: [id number] DOB:10-Sep-1961, 63 y.o., female Today's Date: 06/30/2024   PCP: [place], [name]., MD REFERRING PROVIDER: Babs Arthea DASEN, MD  END OF SESSION:                             Past Medical History:  Diagnosis Date   Anxiety    Aortic atherosclerosis 04/16/2021   Atypical angina    Back pain    related to spinal stenosis and disc problem, radiates down left buttocks to leg., weakness occ.   Chest pain    a. 03/2015 Cath: nl cors; b. 03/2021 Cor CTA: Ca2+ = 0. Nl Cors.   Chronic pain syndrome    Dyspnea    hx   GERD (gastroesophageal reflux disease)    Grade I diastolic dysfunction    Headache    Hyperlipidemia    Hypertension    Lumbar post-laminectomy syndrome    LVH (left ventricular hypertrophy) 12/15/2020   a. 11/2020 Echo: EF 65-70%, no rwma, sev asymm LVH with IVSd 1.9 cm. No LVOT obs @ rest. Gr1 DD. Triv MR.   PONV (postoperative nausea and vomiting)    Pulmonary nodules    a. 03/2021 CT Chest: 3mm pulm nodules in bilat lower lobes. F/u 1 yr.   Right foot drop    Syncope    a. 11/2020 Zio: No significant arrhythmias.   Vaginal foreign object    Uses Femring    Past Surgical History:  Procedure Laterality Date   ABDOMINAL HYSTERECTOMY     ANTERIOR CERVICAL DECOMP/DISCECTOMY FUSION N/A 01/03/2022   Procedure: Anterior Cervical Decompression Fusion - Cervical four-Cervical five - Cervical five-Cervical six;  Surgeon: Joshua Alm RAMAN, MD;  Location: [facility] OR;  Service: Neurosurgery;  Laterality: N/A;   BIOPSY  12/16/2020   Procedure: BIOPSY;  Surgeon: Aneita Gwendlyn DASEN, MD;  Location: [facility] ENDOSCOPY;  Service: Endoscopy;;   CARDIAC CATHETERIZATION N/A 04/18/2015   Procedure: Left Heart Cath and Coronary Angiography;  Surgeon: Rober Chroman, MD;  Location: [facility] INVASIVE CV LAB;  Service: Cardiovascular;  Laterality: N/A;   COLONOSCOPY      COLONOSCOPY W/ BIOPSIES AND POLYPECTOMY  2018   ESOPHAGOGASTRODUODENOSCOPY (EGD) WITH PROPOFOL  N/A 12/16/2020   Procedure: ESOPHAGOGASTRODUODENOSCOPY (EGD) WITH PROPOFOL ;  Surgeon: Aneita Gwendlyn DASEN, MD;  Location: [facility] ENDOSCOPY;  Service: Endoscopy;  Laterality: N/A;   FOOT SURGERY Bilateral    [facility] bunion,bone spur, tendon (1) -6'16, (1)-10'16   HEMATOMA EVACUATION N/A 01/05/2022   Procedure: Cervical Wound Exploration;  Surgeon: Gillie Duncans, MD;  Location: [facility] OR;  Service: Neurosurgery;  Laterality: N/A;   IR EPIDUROGRAPHY  07/21/2018   LUMBAR LAMINECTOMY/DECOMPRESSION MICRODISCECTOMY Bilateral 12/28/2015   Procedure: MICRO LUMBAR DECOMPRESSION L4 - L5 BILATERALLY;  Surgeon: Reyes Billing, MD;  Location: [facility] ORS;  Service: Orthopedics;  Laterality: Bilateral;   LUMBAR LAMINECTOMY/DECOMPRESSION MICRODISCECTOMY Bilateral 03/04/2018   Procedure: Revision of Microlumbar Decompression Bilateral Lumbar Four-Five;  Surgeon: Billing Reyes, MD;  Location: [facility] OR;  Service: Orthopedics;  Laterality: Bilateral;  90 mins   LUMBAR WOUND DEBRIDEMENT N/A 12/25/2023   Procedure: Irrigation and debridement of lumbar wound;  Surgeon: Joshua Alm Hamilton, MD;  Location: [facility] OR;  Service: Neurosurgery;  Laterality: N/A;   POSTERIOR FUSION PEDICLE SCREW PLACEMENT N/A 12/07/2023   Procedure: LUMBAR FOUR-FIVE POSTERIOR LATERAL FUSION;  Surgeon: Joshua Alm Hamilton, MD;  Location: [facility] OR;  Service: Neurosurgery;  Laterality: N/A;   SAVORY DILATION N/A 12/16/2020   Procedure: SAVORY DILATION;  Surgeon: Aneita Gwendlyn DASEN, MD;  Location: [facility] ENDOSCOPY;  Service: Endoscopy;  Laterality: N/A;   SPINAL CORD STIMULATOR INSERTION N/A 09/28/2019   Procedure: THORACIC SPINAL CORD STIMULATOR INSERTION;  Surgeon: Burnetta Aures, MD;  Location: [facility] OR;  Service: Orthopedics;  Laterality: N/A;  2.5 hrs   SPINAL CORD STIMULATOR REMOVAL N/A 05/27/2021   Procedure: LUMBAR SPINAL CORD STIMULATOR REMOVAL;  Surgeon: Bluford Standing,  MD;  Location: [facility] ORS;  Service: Neurosurgery;  Laterality: N/A;   TUBAL LIGATION     WISDOM TOOTH EXTRACTION     WOUND EXPLORATION N/A 03/04/2018   Procedure: EXPLORATION OF LUMBAR DECOMPRESSION WOUND;  Surgeon: Duwayne Purchase, MD;  Location: [facility] OR;  Service: Orthopedics;  Laterality: N/A;   Patient Active Problem List   Diagnosis Date Noted   Vitamin D  deficiency, unspecified 04/13/2024   Seasonal allergic rhinitis due to pollen 01/29/2024   Delayed wound healing 12/25/2023   S/P lumbar fusion 12/07/2023   Pain in thumb joint with movement of right hand 08/12/2023   Localized primary osteoarthritis of carpometacarpal joint of right thumb 07/29/2023   Right foot drop 07/29/2023   Frequent falls 04/14/2023   Gastroesophageal reflux disease 04/14/2023   Right upper extremity numbness 09/29/2022   Stage 3a chronic kidney disease (HCC) 09/29/2022   Cervical spondylosis with radiculopathy 01/05/2022   S/P cervical spinal fusion 01/03/2022   Cervical disc disorder with radiculopathy of cervical region 11/27/2021   Uncontrolled pain 05/27/2021   Syncope 05/13/2021   Depression with anxiety 05/13/2021   Chronic diastolic CHF (congestive heart failure) (HCC) 05/13/2021   Chronic back pain 05/13/2021   Chest tightness 05/13/2021   Lower abdominal pain 05/13/2021   Syncope and collapse 01/01/2021   Abnormal echocardiogram 01/01/2021   BPPV (benign paroxysmal positional vertigo), left 12/26/2020   Odynophagia    Dysphagia 12/15/2020   Postural dizziness with presyncope 12/14/2020   Lumbar post-laminectomy syndrome    Chronic pain 09/28/2019   Lumbar spinal stenosis    Radicular pain    Gait disorder    Difficulty in urination 07/13/2018   Back pain 07/12/2018   Hyperlipidemia 04/16/2018   GAD (generalized anxiety disorder) 04/16/2018   AKI (acute kidney injury)    Benign essential HTN    Major depression, recurrent    Lumbar radiculopathy 03/09/2018   Paraparesis (HCC)  03/09/2018   Essential hypertension    Chronic nonintractable headache    Leukocytosis    Acute blood loss anemia    Postoperative pain    Generalized weakness    Spinal stenosis at L4-L5 level 03/04/2018   Spinal stenosis of lumbar region 12/28/2015   Chest pain 04/16/2015    ONSET DATE: Most recent Surgery date 12-07-23:  Referral date 02-03-24  REFERRING DIAG:  M54.16 (ICD-10-CM) - Lumbar radiculopathy M96.1 (ICD-10-CM) - Lumbar post-laminectomy syndrome R26.9 (ICD-10-CM) - Gait disorder  THERAPY DIAG:  No diagnosis found.  Rationale for Evaluation and Treatment: Rehabilitation  SUBJECTIVE:  SUBJECTIVE STATEMENT: Pt reports she is doing OK - is still having a lot of soreness in her back; says the fall really set her back  Pt accompanied by: self  PERTINENT HISTORY: The patient was admitted on 12/07/2023 &  underwent PLIF L4-5. Hospitalized 12-07-23 - 12-11-23:  The patient was admitted on 12/25/2023 and taken to the operating room where the patient underwent irrigation and debridement of lumbar wound with primary closure and placement of external wound VAC- hospitalized 12-25-23 -  12-26-23  Frequent falls:  Spinal cord stimulator insertion on 09-28-19; stimulator removal on 05-27-21 with resultant LLE weakness;  decompression and laminectomy on 12/28/15 at L4-L5  with redo decompression surgery on May 9,2019, post-op paraparesis with surgery to remove small hematoma; 07/12/18 fall at work (involved rolling chair) with hospital admission, transfer to CIR with d/c home 08/03/18; HTN, Bil foot surgery; left ventricular hypertrophy, chronic pain syndrome, dyspnea, hypertrophic cardiomyopathy; cervical fusion March 2023;  had carpal tunnel surgery RUE in April 2024:  LVH  PAIN:  Are you having pain? Pt rates pain 5/10  in lumbar region - greater pain in lower lumbar region near sacral area- same burning sensation Alleviating Factor:  placing pillow behind back when sitting Aggravating Factors: certain movements  Rt hand pain - Rt thenar eminence and lateral distal forearm; rates pain severe intensity - must modify technique with sit to stand transfer due to inability to push up with RUE   PRECAUTIONS: Fall; No bending/lifting or twisting; pt states she has a reacher  WEIGHT BEARING RESTRICTIONS: No  FALLS: Has patient fallen in last 6 months? Yes. Number of falls 3  LIVING ENVIRONMENT: Lives with: lives with their spouse Lives in: House/apartment Stairs: Yes: External: 5 steps; on right going up Has following equipment at home: Single point cane, Wheelchair (manual), and L[place]strand crutch  PLOF: Independent with basic ADLs, Independent with household mobility with device, Independent with community mobility with device, and Independent with transfers  PATIENT GOALS: improve walking and strength Rt leg  OBJECTIVE:   DIAGNOSTIC FINDINGS: IMPRESSION: 1. Interval anterior discectomy and fusion from C4 through C6 with improved patency of the spinal canal and only mild residual foraminal narrowing. 2. Stable mild spinal stenosis and mild to moderate left foraminal narrowing at C3-4. 3. Stable mild spinal stenosis and mild foraminal narrowing bilaterally at C6-7. Mild foraminal narrowing bilaterally at C7-T1. 4. No cord deformity or abnormal cord signal.  No acute findings. No recent MRI of lumbar region  COGNITION: Overall cognitive status: Within functional limits for tasks assessed   SENSATION: WFL  COORDINATION: Decreased RLE due to weakness and spasticity  POSTURE: No Significant postural limitations  LOWER EXTREMITY ROM:   LLE WFL's;  RLE WFL's except for ankle ROM which is minimal due to weakness; Pt has difficulty performing Rt knee extension due to Rt quad weakness and spasticity -  turned body slightly to Lt to achieve more knee extension   LOWER EXTREMITY MMT:    MMT Right Eval Left Eval  Hip flexion 3- 3+   Hip extension    Hip abduction    Hip adduction    Hip internal rotation    Hip external rotation    Knee flexion 3+   Knee extension 4- 4  Ankle dorsiflexion 2-   Ankle plantarflexion    Ankle inversion    Ankle eversion    (Blank rows = not tested)  BED MOBILITY:  Modified independent  TRANSFERS: Assistive device utilized: Rollator  Sit to stand:  Modified independence Stand to sit: Modified independence   STAIRS:  Not tested due to c/o back pain   GAIT: Gait pattern: decreased step length- Right, decreased stance time- Right, decreased hip/knee flexion- Right, and decreased ankle dorsiflexion- Right Distance walked: 50' Assistive device utilized: with rollator Level of assistance: SBA and CGA Comments: slow gait speed due to c/o back pain and RLE weakness  FUNCTIONAL TESTS:  Sit to stand score - 2 reps - with bil. UE support from chair- 29.91 secs             TUG score: - 49.19 secs with rollator  Gait velocity:  37.38 secs = .88 ft/sec with rollator  PATIENT SURVEYS:  N/A - based on diagnosis of RLE weakness due to chronic lumbar radiculopathy   TODAY'S TREATMENT: 08-02-24                                                                                                                           Gait: Pt using rollator and wearing Blue rocker AFO on RLE - pt modified independent with use of rollator; ambulating very slowly due to c/o low back pain  Pt amb. Clinic distances with rollator with supervision   TherEx:  Pt performed following exercises in supine position to reduce back pain and to increase strength in low back musc. and ROM of lumbar spine  Pelvic tilt 10 reps 5 sec Pelvic tilt with RLE heel slide with extension 10 reps- no weight;  LLE 3# 10 reps with pelvic tilt Hip abdct. 8 reps 3# LLE; 2 reps without weight   Glut squeezes 10 reps - 3-4 sec hold SAQ's 5 reps 5 sec hold with 3# weight on LLE:  - bil. LE's on bolster; 5 reps without weight In hooklying position - Rt hip flexion 5 reps x 1 set:  LLE 5 reps x 2 sets - pelvic tilt prior to flexing hip  Ice pack to low back with pt seated in chair - at end of session  TherAct: TUG score:   36.63 secs with rollator Discussed STG's ; pt rates LBP 6-7/10 intensity at start of session   Medbridge:   Access Code: [email or web address] URL: [email or web address] Date: 05/23/2024 Prepared by: Rock Kussmaul  Exercises - Supine Posterior Pelvic Tilt  - 1 x daily - 7 x weekly - 1 sets - 10 reps - 5 sec hold - Supine Heel Slides  - 1 x daily - 7 x weekly - 1 sets - 10 reps - Lifting leg up/down in this position  - 1 x daily - 7 x weekly - 1 sets - 10 reps - Supine Lower Trunk Rotation  - 1 x daily - 7 x weekly - 1 sets - 5 reps - Supine Bridge with Pelvic Floor Contraction  - 1 x daily - 7 x weekly - 1 sets - 5 reps - 3 sec hold =============================    UPDATED HEP: Access Code: [email or web address] URL: [email or web address] Date: 04/27/2024 Prepared  by: Rock Kussmaul  Exercises - Seated Knee Extension with Resistance  - 1 x daily - 7 x weekly - 3 sets - 10 reps - TAP UPS - with Left leg   - 1 x daily - 7 x weekly - 3 sets - 10 reps - Standing March with Counter Support  - 1 x daily - 7 x weekly - 3 sets - 10 reps - Forward Step Up with Counter Support  - 1 x daily - 7 x weekly - 3 sets - 10 reps - Alternating Step Forward with Support  - 1 x daily - 7 x weekly - 3 sets - 10 reps   PATIENT EDUCATION: Education details:  Medbridge  Person educated: Patient Education method: Explanation - handout declined as pt stated she has it from previous OP admission Education comprehension: verbalized understanding  HOME EXERCISE PROGRAM:   GOALS: Goals reviewed with patient? Yes    UPDATED LONG TERM GOALS: Target date: 06-10-24    Amb. 350' with RW with SBA without requiring seated rest period. Baseline: 134' x 1, 87' x 1 rep - 04-19-24;  measure lobby Goal status: Not assessed on 06-28-24 due to c/o increased low back pain  2.  Improve TUG score to </= 22 secs  with use of RW for improved mobility and reduced fall risk. Baseline: 49.19 secs with rollator;  28.5 secs with rollator - 04-19-24;  43.47 secs with rollator - 06-28-24 Goal status:  Ongoing  3.  Perform 5x sit to stand transfers with LUE support only to demo improved strength in RLE IN </= 25 secs to demo improved functional mobility. Baseline: 36.16 secs - 4x with bil. UE support from chair - had to stop due to burning sensation in low back;  42.97 secs 06-28-24 Goal status:  Ongoing  4.  Increase gait velocity to >/= 1.6 ft/sec with rollator for increased gait efficiency with reduced fall risk.          Baseline: 37.38 secs = .88 ft/sec with rollator;  33.44 secs = .98 ft/sec with rollator;  36.22, 38.09 secs          Goal status:  ONGOING   6.  Independent in updated HEP for RLE strengthening. Baseline:  Goal status:  Goal met 06-09-24   7.  Report reduced low back pain at rest to </= 3/10 intensity.            Baseline:  7/10;    04-20-24 -- 5/10;    5/10 = 06-09-24;  06-28-24 = 8/10 intensity            Goal status: Not met 06-09-24; not met 06-28-24   UPDATED SHORT TERM GOALS:  TARGET DATE 07-22-24:  1.  Amb. 230' with RW with SBA without requiring seated rest period.             Baseline: 134' x 1, 87' x 1 rep on 04-19-24;  approx. 100' from lobby to mat table in gym with rollator on 06-28-24     Goal status:  Ongoing;  NT 08-02-24 per pt report due to low back pain  2.  Improve TUG score to </= 30 secs  with use of RW for improved mobility and reduced fall risk. Baseline: 49.19 secs with rollator;  28.5 secs with rollator; 43.47 secs on 06-28-24;  36.63 secs on 08-02-24 Goal status:  Not met 08-02-24  3.  Perform 5x sit to stand from chair with bil. UE support in  </= 30  secs to demonstrate improved mobility.     Baseline:  36.16 secs - 4 reps with bil. UE support from chair:  42.97 secs 06-28-24    Goal status:  Ongoing;  NT on 08-02-24 due to pt's request    4.  Pt will report reduced pain in low back to </= 5/10 intensity for increased ease with ADL's and mobility.    Baseline:  5/10 on 04-19-24:  8/10 on 06-28-24;  6-7/10 on 08-02-24    Goal status:  Not met 08-02-24   UPDATED LONG TERM GOALS: Target date: 08-19-24   Amb. 350' with RW with SBA without requiring seated rest period. Baseline: 134' x 1, 87' x 1 rep - 04-19-24;  measure lobby Goal status: Not assessed on 06-28-24 due to c/o increased low back pain; Ongoing goal  2.  Improve TUG score to </= 25 secs  with use of RW for improved mobility and reduced fall risk. Baseline: 49.19 secs with rollator;  28.5 secs with rollator - 04-19-24;  43.47 secs with rollator - 06-28-24 Goal status:  Revised   3.  Perform 5x sit to stand transfers with LUE support only to demo improved strength in RLE IN </= 25 secs to demo improved functional mobility. Baseline: 36.16 secs - 4x with bil. UE support from chair - had to stop due to burning sensation in low back;  42.97 secs 06-28-24 Goal status:  Ongoing  4.  Increase gait velocity to >/= 1.6 ft/sec with rollator for increased gait efficiency with reduced fall risk.          Baseline: 37.38 secs = .88 ft/sec with rollator;  33.44 secs = .98 ft/sec with rollator; 06-28-24 =  36.22, 38.09 secs          Goal status:  ONGOING   6.  Independent in revised/updated HEP for RLE strengthening. Baseline:  Goal status: Revised   7.  Report reduced low back pain at rest to </= 3/10 intensity.            Baseline:  7/10;    04-20-24 -- 5/10;    5/10 = 06-09-24;  06-28-24 = 8/10 intensity            Goal status: Ongoing   ASSESSMENT:   CLINICAL IMPRESSION: Pt has not met any of updated STG's:  STG #1 and 3 not tested per pt's request due to increased back pain.  STG's #2 & 4  not met as TUG score = 36.63 secs with rollator and c/o LBP 6-7/10 intensity.  Pt's progress has been impacted by fall sustained during hospitalization for Covid.  Today's PT session focused on exercises for lumbar stabilization and low back strengthening.  3# weight used on LLE for increased challenge with lumbar stabilization exercises.  Pt reported increased discomfort on Rt lateral low back area with LE's on red physioball for AAROM hip flexion/extension with knee flexion; this exercise was discontinued due to c/o discomfort.  Cont with POC.   OBJECTIVE IMPAIRMENTS: Abnormal gait, decreased balance, decreased coordination, decreased endurance, decreased strength, and impaired tone.  ACTIVITY LIMITATIONS: carrying, bending, squatting, stairs, transfers, and locomotion level  PARTICIPATION LIMITATIONS: cleaning, laundry, shopping, and community activity  PERSONAL FACTORS: Behavior pattern, Past/current experiences, and Time since onset of injury/illness/exacerbation are also affecting patient's functional outcome.   REHAB POTENTIAL: Good  CLINICAL DECISION MAKING: Evolving/moderate complexity  EVALUATION COMPLEXITY: Moderate  PLAN:  PT FREQUENCY: 2x/week  PT DURATION: 8 weeks  PLANNED INTERVENTIONS: Therapeutic exercises, Therapeutic activity, Neuromuscular  re-education, Balance training, Gait training, Patient/Family education, Self Care, Stair training, Orthotic/Fit training, DME instructions, and Aquatic Therapy  PLAN FOR NEXT SESSION:  Continue with low back stretching; ball exercises; pelvic rocking if tolerated    Taevin Mcferran, [place], PT 08/09/2024, 10:25 AM

## 2024-08-09 NOTE — Assessment & Plan Note (Signed)
 Explained that this could be contributing to persistent cough, she is not longer on Omeprazole , did not help. She is not having heartburn, so she is not interested in trying a different PPI. Continue GERD precautions.

## 2024-08-09 NOTE — Progress Notes (Signed)
 ACUTE VISIT Chief Complaint  Patient presents with   Cough   Headache    Discussed the use of AI scribe software for clinical note transcription with the patient, who gave verbal consent to proceed.  History of Present Illness [name] is a 63 year old female  with PMHx significant for HTN, diastolic dysfunction,chronic pain, GERD, and anxiety who presents with cough and headache. She was seen on 07/08/24 for fatigue,headache,and cough after Dx'ed with COVID 19 infection in 05/2024.   She has been experiencing a persistent non-productive cough for the past couple of weeks, often triggered by talking or physical activity. She has a history of a similar cough post-COVID, which resolved, not sure for how long; but has since recurred.   Over-the-counter medications like Mucinex  and Delsym have not provided relief. She previously used benzonatate , did not help, and Hycodan syrup, which offered some relief, but she is not currently using these medications. No wheezing or difficulty breathing is reported, but she describes a tickling sensation in her throat.  She reports a frontal headache attributed to the persistent coughing, present for a couple of weeks. The headache is described as a pressure-like sensation, sometimes reaching a severity of 7 out of 10, particularly when the cough is severe. She experiences occasional blurry vision that last a few seconds with the headache. Tylenol  provides temporary relief, but the headache returns with coughing.  She denies hx of asthma or acid reflux and has tested negative for COVID recently.  She has a history of using an inhaler in the past, not sure about name,which did not provide cough relief.   Chronic pain and anxiety: She is on Alprazolam  0.5 mg 1/2-1 tab daily as needed. She is not currently taking any pain medication, was on Nucynta , which was previously prescribed as needed.   She sometimes uses intranasal steroid spray for  rhinorrhea, Nasonex .   CXR on 06/03/2024 negative for acute cardiopulmonary abnormality. Portable chest x-ray on 06/17/2024: Negative. She had a chest CT in 02/2022 due to cough and CP: Negative.  Head CT on 06/17/2024: Stable and normal for age noncontrast CT appearance of the brain.  Minimal to mild new bilateral paranasal sinus inflammation.  Review of Systems  Constitutional:  Positive for fatigue. Negative for activity change, appetite change, chills and fever.  HENT:  Positive for rhinorrhea. Negative for sore throat, trouble swallowing and voice change.   Respiratory:  Negative for stridor.   Cardiovascular:  Negative for chest pain.  Gastrointestinal:  Negative for abdominal pain, nausea and vomiting.  Skin:  Negative for rash.  Neurological:  Negative for syncope and facial asymmetry.  Psychiatric/Behavioral:  Negative for confusion. The patient is nervous/anxious.   See other pertinent positives and negatives in HPI.  Current Outpatient Medications on File Prior to Visit  Medication Sig Dispense Refill   acetaminophen  (TYLENOL ) 500 MG tablet Take 500 mg by mouth every 6 (six) hours as needed for headache.     ALPRAZolam  (XANAX ) 0.5 MG tablet Take 1/2 - 1 tablet by mouth at bedtime as needed for anxiety. 30 tablet 3   amitriptyline  (ELAVIL ) 25 MG tablet Take 1 tablet (25 mg total) by mouth daily. 90 tablet 2   amLODipine  (NORVASC ) 5 MG tablet Take 1 tablet (5 mg total) by mouth daily. 90 tablet 2   baclofen  (LIORESAL ) 10 MG tablet Take 1 tablet (10 mg total) by mouth 3 (three) times daily as needed. 60 each 3   betamethasone  dipropionate 0.05 %  cream Apply as directed to itchy areas of skin on back only, avoiding normal skin, as needed. 45 g 1   carvedilol  (COREG ) 25 MG tablet Take 1 tablet (25 mg total) by mouth 2 (two) times daily with a meal. 180 tablet 3   cholecalciferol  (VITAMIN D3) 25 MCG (1000 UNIT) tablet Take 1,000 Units by mouth daily.     diphenhydrAMINE  (BENADRYL ) 25 MG  tablet Take 25 mg by mouth daily as needed for itching.     DULoxetine  (CYMBALTA ) 30 MG capsule Take 1 capsule (30 mg total) by mouth daily.     Estradiol  Acetate (FEMRING ) 0.05 MG/24HR RING USE ONE RING VAGINALLY EVERY 3 MONTHS AS DIRECTED 1 each 4   gabapentin  (NEURONTIN ) 600 MG tablet Take 0.5 tablets (300 mg total) by mouth 3 (three) times daily.     gabapentin  (NEURONTIN ) 600 MG tablet Take 1 tablet (600 mg total) by mouth 3 (three) times daily. 90 tablet 3   hydrALAZINE  (APRESOLINE ) 50 MG tablet Take 1 tablet (50 mg total) by mouth 2 (two) times daily.     meclizine  (ANTIVERT ) 25 MG tablet Take 1 tablet (25 mg total) by mouth 3 (three) times daily as needed for dizziness. 30 tablet 0   methocarbamol  (ROBAXIN ) 500 MG tablet Take 1 tablet (500 mg total) by mouth 3 (three) times daily as needed for muscle spasm. 30 tablet 6   methocarbamol  (ROBAXIN ) 500 MG tablet Take 1 tablet (500 mg total) by mouth 3 (three) times daily as needed for muscle spasms. 30 tablet 1   docusate sodium  (COLACE) 100 MG capsule Take 100 mg by mouth 2 (two) times daily. (Patient not taking: Reported on 08/09/2024)     guaiFENesin  (MUCINEX ) 600 MG 12 hr tablet Take 2 tablets (1,200 mg total) by mouth 2 (two) times daily as needed. (Patient not taking: Reported on 08/09/2024) 30 tablet 0   mometasone  (NASONEX ) 50 MCG/ACT nasal spray Place 2 sprays into the nose daily. (Patient not taking: Reported on 08/09/2024) 17 g 3   [DISCONTINUED] levocetirizine (XYZAL  ALLERGY 24HR) 5 MG tablet Take 1 tablet (5 mg total) by mouth every evening. 90 tablet 0   [DISCONTINUED] omeprazole  (PRILOSEC) 40 MG capsule Take 1 capsule (40 mg total) by mouth daily. 90 capsule 1   No current facility-administered medications on file prior to visit.   Past Medical History:  Diagnosis Date   Anxiety    Aortic atherosclerosis 04/16/2021   Atypical angina    Back pain    related to spinal stenosis and disc problem, radiates down left buttocks to  leg., weakness occ.   Chest pain    a. 03/2015 Cath: nl cors; b. 03/2021 Cor CTA: Ca2+ = 0. Nl Cors.   Chronic pain syndrome    Dyspnea    hx   GERD (gastroesophageal reflux disease)    Grade I diastolic dysfunction    Headache    Hyperlipidemia    Hypertension    Lumbar post-laminectomy syndrome    LVH (left ventricular hypertrophy) 12/15/2020   a. 11/2020 Echo: EF 65-70%, no rwma, sev asymm LVH with IVSd 1.9 cm. No LVOT obs @ rest. Gr1 DD. Triv MR.   PONV (postoperative nausea and vomiting)    Pulmonary nodules    a. 03/2021 CT Chest: 3mm pulm nodules in bilat lower lobes. F/u 1 yr.   Right foot drop    Syncope    a. 11/2020 Zio: No significant arrhythmias.   Vaginal foreign object    Uses Femring   Allergies  Allergen Reactions   Cephalosporins Anaphylaxis   Penicillins Anaphylaxis and Hives   Anesthetics, Amide Nausea And Vomiting    Does not know name of it. States they put it on record foot center.    Betadine [Povidone Iodine] Other (See Comments)    Skin Burn caused scar on Left buttock   Latex Hives, Itching, Rash and Other (See Comments)   Peach [Prunus Persica] Hives   Xyzal  [Levocetirizine] Itching    Patient took Xyzal  for the first time to see if med would help her seasonal rhinitis d/t increased pollen - patient stated that she experienced intense itching over her entire body   Claritin [Loratadine]     pruritis     Social History   Socioeconomic History   Marital status: Married    Spouse name: Rayne Sprang   Number of children: 1   Years of education: Not on file   Highest education level: Not on file  Occupational History   Not on file  Tobacco Use   Smoking status: Never   Smokeless tobacco: Never  Vaping Use   Vaping status: Never Used  Substance and Sexual Activity   Alcohol use: Never   Drug use: Never   Sexual activity: Yes    Partners: Male    Birth control/protection: Post-menopausal, Surgical    Comment: Hysterectomy  Other Topics  Concern   Not on file  Social History Narrative   Not on file   Social Drivers of Health   Financial Resource Strain: Low Risk  (05/02/2024)   Overall Financial Resource Strain (CARDIA)    Difficulty of Paying Living Expenses: Not very hard  Food Insecurity: No Food Insecurity (06/17/2024)   Hunger Vital Sign    Worried About Running Out of Food in the Last Year: Never true    Ran Out of Food in the Last Year: Never true  Transportation Needs: No Transportation Needs (06/17/2024)   PRAPARE - Transportation    Lack of Transportation (Medical): No    Lack of Transportation (Non-Medical): No  Physical Activity: Insufficiently Active (05/02/2024)   Exercise Vital Sign    Days of Exercise per Week: 2 days    Minutes of Exercise per Session: 30 min  Stress: No Stress Concern Present (10/02/2023)   [employer] of Occupational Health - Occupational Stress Questionnaire    Feeling of Stress : Not at all  Social Connections: Socially Integrated (06/14/2024)   Social Connection and Isolation Panel    Frequency of Communication with Friends and Family: More than three times a week    Frequency of Social Gatherings with Friends and Family: More than three times a week    Attends Religious Services: More than 4 times per year    Active Member of [employer] or Organizations: Yes    Attends Banker Meetings: More than 4 times per year    Marital Status: Married    Vitals:   08/09/24 1139  BP: 124/80  Pulse: 84  Resp: 16  Temp: 97.9 F (36.6 C)  SpO2: 99%   Body mass index is 26.96 kg/m.  Physical Exam Vitals and nursing note reviewed.  Constitutional:      General: She is not in acute distress.    Appearance: She is well-developed. She is not ill-appearing.  HENT:     Head: Normocephalic and atraumatic.     Nose: No congestion or rhinorrhea.     Right Sinus: Frontal sinus tenderness present. No maxillary sinus tenderness.  Left Sinus: Frontal sinus tenderness present.  No maxillary sinus tenderness.     Mouth/Throat:     Mouth: Mucous membranes are moist.  Eyes:     Conjunctiva/sclera: Conjunctivae normal.  Cardiovascular:     Rate and Rhythm: Normal rate and regular rhythm.     Heart sounds: No murmur heard. Pulmonary:     Effort: Pulmonary effort is normal. No respiratory distress.     Breath sounds: Normal breath sounds.  Lymphadenopathy:     Cervical: No cervical adenopathy.  Skin:    General: Skin is warm.     Findings: No erythema or rash.  Neurological:     Mental Status: She is alert and oriented to person, place, and time.     Comments: Unstable , antalgic assisted by a cane.  Psychiatric:        Mood and Affect: Affect normal. Mood is anxious.    ASSESSMENT AND PLAN:  Ms. Vessels was seen today for persistent cough.  Cough, persistent Problem has been intermittent for sometime. We discussed possible etiologies, including allergies, asthma/COPD,post viral,and GERD among some. She is not on ACEI's. Lung auscultation negative, so I do not think we need to repeat imaging. Benzonatate  did not help. She would like another refill of Hycodan, which she has had on 06/03/24 (120 ml),06/21/24 (100 ml),and 07/08/24 (50 ml). We discussed some side effects. She is not longer on Nucynta . Pulmonology referral paced.  -     Pulmonary referral -     HYDROcodone  Bit-Homatrop MBr; Take 5 mLs by mouth every 12 (twelve) hours as needed for up to 10 days.  Dispense: 80 mL; Refill: 0  Frontal headache Attributed to persistent cough. Head CT on 06/17/24 showed minimal to mild new bilateral paranasal sinus inflammation, which could be contributing to headache. Agrees with trying a course of abx. Instructed about warning signs.  -     Doxycycline  Hyclate; Take 1 tablet (100 mg total) by mouth 2 (two) times daily for 7 days.  Dispense: 14 tablet; Refill: 0  Gastroesophageal reflux disease, unspecified whether esophagitis present Assessment &  Plan: Explained that this could be contributing to persistent cough, she is not longer on Omeprazole , did not help. She is not having heartburn, so she is not interested in trying a different PPI. Continue GERD precautions.  Return if symptoms worsen or fail to improve, for keep next appointment.  Alanys Godino G. [place], MD  [facility]. [place] office.

## 2024-08-10 ENCOUNTER — Encounter: Payer: Self-pay | Admitting: Physical Therapy

## 2024-08-10 ENCOUNTER — Other Ambulatory Visit: Payer: Self-pay

## 2024-08-10 DIAGNOSIS — M5416 Radiculopathy, lumbar region: Secondary | ICD-10-CM | POA: Diagnosis not present

## 2024-08-10 DIAGNOSIS — M4316 Spondylolisthesis, lumbar region: Secondary | ICD-10-CM | POA: Diagnosis not present

## 2024-08-11 ENCOUNTER — Ambulatory Visit: Payer: Self-pay | Admitting: Physical Therapy

## 2024-08-11 DIAGNOSIS — M5459 Other low back pain: Secondary | ICD-10-CM | POA: Diagnosis not present

## 2024-08-11 DIAGNOSIS — M6281 Muscle weakness (generalized): Secondary | ICD-10-CM

## 2024-08-11 NOTE — Therapy (Signed)
 OUTPATIENT PHYSICAL THERAPY NEURO TREATMENT NOTE        Patient Name: [name] MRN: [id number] DOB:1961-08-19, 63 y.o., female Today's Date: 06/30/2024   PCP: [place], [name]., MD REFERRING PROVIDER: Babs Arthea DASEN, MD  END OF SESSION:  PT End of Session - 08/12/24 1915     Visit Number 27    Number of Visits 36    Date for Recertification  08/19/24    Authorization Type [employer]    Authorization Time Period 02-16-24 -04-25-24:  04-19-24 - 06-26-24;  06-28-24 - 08-26-24    Progress Note Due on Visit 30    PT Start Time 0932    PT Stop Time 1021    PT Time Calculation (min) 49 min    Activity Tolerance Patient tolerated treatment well;Patient limited by pain    Behavior During Therapy [facility] for tasks assessed/performed                                     Past Medical History:  Diagnosis Date   Anxiety    Aortic atherosclerosis 04/16/2021   Atypical angina    Back pain    related to spinal stenosis and disc problem, radiates down left buttocks to leg., weakness occ.   Chest pain    a. 03/2015 Cath: nl cors; b. 03/2021 Cor CTA: Ca2+ = 0. Nl Cors.   Chronic pain syndrome    Dyspnea    hx   GERD (gastroesophageal reflux disease)    Grade I diastolic dysfunction    Headache    Hyperlipidemia    Hypertension    Lumbar post-laminectomy syndrome    LVH (left ventricular hypertrophy) 12/15/2020   a. 11/2020 Echo: EF 65-70%, no rwma, sev asymm LVH with IVSd 1.9 cm. No LVOT obs @ rest. Gr1 DD. Triv MR.   PONV (postoperative nausea and vomiting)    Pulmonary nodules    a. 03/2021 CT Chest: 3mm pulm nodules in bilat lower lobes. F/u 1 yr.   Right foot drop    Syncope    a. 11/2020 Zio: No significant arrhythmias.   Vaginal foreign object    Uses Femring    Past Surgical History:  Procedure Laterality Date   ABDOMINAL HYSTERECTOMY     ANTERIOR CERVICAL DECOMP/DISCECTOMY FUSION N/A 01/03/2022   Procedure: Anterior Cervical  Decompression Fusion - Cervical four-Cervical five - Cervical five-Cervical six;  Surgeon: Joshua Alm RAMAN, MD;  Location: [facility] OR;  Service: Neurosurgery;  Laterality: N/A;   BIOPSY  12/16/2020   Procedure: BIOPSY;  Surgeon: Aneita Gwendlyn DASEN, MD;  Location: [facility] ENDOSCOPY;  Service: Endoscopy;;   CARDIAC CATHETERIZATION N/A 04/18/2015   Procedure: Left Heart Cath and Coronary Angiography;  Surgeon: Rober Chroman, MD;  Location: [facility] INVASIVE CV LAB;  Service: Cardiovascular;  Laterality: N/A;   COLONOSCOPY     COLONOSCOPY W/ BIOPSIES AND POLYPECTOMY  2018   ESOPHAGOGASTRODUODENOSCOPY (EGD) WITH PROPOFOL  N/A 12/16/2020   Procedure: ESOPHAGOGASTRODUODENOSCOPY (EGD) WITH PROPOFOL ;  Surgeon: Aneita Gwendlyn DASEN, MD;  Location: [facility] ENDOSCOPY;  Service: Endoscopy;  Laterality: N/A;   FOOT SURGERY Bilateral    [facility] bunion,bone spur, tendon (1) -6'16, (1)-10'16   HEMATOMA EVACUATION N/A 01/05/2022   Procedure: Cervical Wound Exploration;  Surgeon: Gillie Duncans, MD;  Location: [facility] OR;  Service: Neurosurgery;  Laterality: N/A;   IR EPIDUROGRAPHY  07/21/2018   LUMBAR LAMINECTOMY/DECOMPRESSION MICRODISCECTOMY Bilateral 12/28/2015   Procedure: MICRO LUMBAR  DECOMPRESSION L4 - L5 BILATERALLY;  Surgeon: Reyes Billing, MD;  Location: [facility] ORS;  Service: Orthopedics;  Laterality: Bilateral;   LUMBAR LAMINECTOMY/DECOMPRESSION MICRODISCECTOMY Bilateral 03/04/2018   Procedure: Revision of Microlumbar Decompression Bilateral Lumbar Four-Five;  Surgeon: Billing Reyes, MD;  Location: [facility] OR;  Service: Orthopedics;  Laterality: Bilateral;  90 mins   LUMBAR WOUND DEBRIDEMENT N/A 12/25/2023   Procedure: Irrigation and debridement of lumbar wound;  Surgeon: Joshua Alm Hamilton, MD;  Location: [facility] OR;  Service: Neurosurgery;  Laterality: N/A;   POSTERIOR FUSION PEDICLE SCREW PLACEMENT N/A 12/07/2023   Procedure: LUMBAR FOUR-FIVE POSTERIOR LATERAL FUSION;  Surgeon: Joshua Alm Hamilton, MD;  Location: [facility] OR;  Service:  Neurosurgery;  Laterality: N/A;   SAVORY DILATION N/A 12/16/2020   Procedure: SAVORY DILATION;  Surgeon: Aneita Gwendlyn DASEN, MD;  Location: [facility] ENDOSCOPY;  Service: Endoscopy;  Laterality: N/A;   SPINAL CORD STIMULATOR INSERTION N/A 09/28/2019   Procedure: THORACIC SPINAL CORD STIMULATOR INSERTION;  Surgeon: Burnetta Aures, MD;  Location: [facility] OR;  Service: Orthopedics;  Laterality: N/A;  2.5 hrs   SPINAL CORD STIMULATOR REMOVAL N/A 05/27/2021   Procedure: LUMBAR SPINAL CORD STIMULATOR REMOVAL;  Surgeon: Bluford Standing, MD;  Location: [facility] ORS;  Service: Neurosurgery;  Laterality: N/A;   TUBAL LIGATION     WISDOM TOOTH EXTRACTION     WOUND EXPLORATION N/A 03/04/2018   Procedure: EXPLORATION OF LUMBAR DECOMPRESSION WOUND;  Surgeon: Billing Reyes, MD;  Location: [facility] OR;  Service: Orthopedics;  Laterality: N/A;   Patient Active Problem List   Diagnosis Date Noted   Vitamin D  deficiency, unspecified 04/13/2024   Seasonal allergic rhinitis due to pollen 01/29/2024   Delayed wound healing 12/25/2023   S/P lumbar fusion 12/07/2023   Pain in thumb joint with movement of right hand 08/12/2023   Localized primary osteoarthritis of carpometacarpal joint of right thumb 07/29/2023   Right foot drop 07/29/2023   Frequent falls 04/14/2023   Gastroesophageal reflux disease 04/14/2023   Right upper extremity numbness 09/29/2022   Stage 3a chronic kidney disease (HCC) 09/29/2022   Cervical spondylosis with radiculopathy 01/05/2022   S/P cervical spinal fusion 01/03/2022   Cervical disc disorder with radiculopathy of cervical region 11/27/2021   Uncontrolled pain 05/27/2021   Syncope 05/13/2021   Depression with anxiety 05/13/2021   Chronic diastolic CHF (congestive heart failure) (HCC) 05/13/2021   Chronic back pain 05/13/2021   Chest tightness 05/13/2021   Lower abdominal pain 05/13/2021   Syncope and collapse 01/01/2021   Abnormal echocardiogram 01/01/2021   BPPV (benign paroxysmal positional vertigo),  left 12/26/2020   Odynophagia    Dysphagia 12/15/2020   Postural dizziness with presyncope 12/14/2020   Lumbar post-laminectomy syndrome    Chronic pain 09/28/2019   Lumbar spinal stenosis    Radicular pain    Gait disorder    Difficulty in urination 07/13/2018   Back pain 07/12/2018   Hyperlipidemia 04/16/2018   GAD (generalized anxiety disorder) 04/16/2018   AKI (acute kidney injury)    Benign essential HTN    Major depression, recurrent    Lumbar radiculopathy 03/09/2018   Paraparesis (HCC) 03/09/2018   Essential hypertension    Chronic nonintractable headache    Leukocytosis    Acute blood loss anemia    Postoperative pain    Generalized weakness    Spinal stenosis at L4-L5 level 03/04/2018   Spinal stenosis of lumbar region 12/28/2015   Chest pain 04/16/2015    ONSET DATE: Most recent Surgery date 12-07-23:  Referral date 02-03-24  REFERRING DIAG:  M54.16 (ICD-10-CM) - Lumbar radiculopathy M96.1 (ICD-10-CM) - Lumbar post-laminectomy syndrome R26.9 (ICD-10-CM) - Gait disorder  THERAPY DIAG:  Other low back pain  Muscle weakness (generalized)  Rationale for Evaluation and Treatment: Rehabilitation  SUBJECTIVE:                                                                                                                                                                                             SUBJECTIVE STATEMENT: Pt reports Dr. Joshua has ordered dry needling for her back; says she is referred to [facility] for this treatment.  Pt wearing wrist splint on RUE - says it helps a lot with her Rt wrist pain.  Has appt with hand surgeon on Thurs., 08-18-24 Pt accompanied by: self  PERTINENT HISTORY: The patient was admitted on 12/07/2023 &  underwent PLIF L4-5. Hospitalized 12-07-23 - 12-11-23:  The patient was admitted on 12/25/2023 and taken to the operating room where the patient underwent irrigation and debridement of lumbar wound with primary closure and  placement of external wound VAC- hospitalized 12-25-23 -  12-26-23  Frequent falls:  Spinal cord stimulator insertion on 09-28-19; stimulator removal on 05-27-21 with resultant LLE weakness;  decompression and laminectomy on 12/28/15 at L4-L5  with redo decompression surgery on May 9,2019, post-op paraparesis with surgery to remove small hematoma; 07/12/18 fall at work (involved rolling chair) with hospital admission, transfer to CIR with d/c home 08/03/18; HTN, Bil foot surgery; left ventricular hypertrophy, chronic pain syndrome, dyspnea, hypertrophic cardiomyopathy; cervical fusion March 2023;  had carpal tunnel surgery RUE in April 2024:  LVH  PAIN:  Are you having pain? Pt rates pain 7-8/10 in lumbar region - greater pain in lower lumbar region near sacral area- same burning sensation Alleviating Factor:  placing pillow behind back when sitting Aggravating Factors: certain movements  08-09-24:  Rt hand pain - Rt thenar eminence and lateral distal forearm; rates pain severe intensity - must modify technique with sit to stand transfer due to inability to push up with RUE   PRECAUTIONS: Fall; No bending/lifting or twisting; pt states she has a reacher  WEIGHT BEARING RESTRICTIONS: No  FALLS: Has patient fallen in last 6 months? Yes. Number of falls 3  LIVING ENVIRONMENT: Lives with: lives with their spouse Lives in: House/apartment Stairs: Yes: External: 5 steps; on right going up Has following equipment at home: Single point cane, Wheelchair (manual), and L[place]strand crutch  PLOF: Independent with basic ADLs, Independent with household mobility with device, Independent with community mobility with device, and Independent with transfers  PATIENT GOALS: improve walking and strength Rt leg  OBJECTIVE:   DIAGNOSTIC FINDINGS: IMPRESSION: 1. Interval anterior discectomy and fusion from C4 through C6 with improved patency of the spinal canal and only mild residual foraminal narrowing. 2. Stable  mild spinal stenosis and mild to moderate left foraminal narrowing at C3-4. 3. Stable mild spinal stenosis and mild foraminal narrowing bilaterally at C6-7. Mild foraminal narrowing bilaterally at C7-T1. 4. No cord deformity or abnormal cord signal.  No acute findings. No recent MRI of lumbar region  COGNITION: Overall cognitive status: Within functional limits for tasks assessed   SENSATION: WFL  COORDINATION: Decreased RLE due to weakness and spasticity  POSTURE: No Significant postural limitations  LOWER EXTREMITY ROM:   LLE WFL's;  RLE WFL's except for ankle ROM which is minimal due to weakness; Pt has difficulty performing Rt knee extension due to Rt quad weakness and spasticity - turned body slightly to Lt to achieve more knee extension   LOWER EXTREMITY MMT:    MMT Right Eval Left Eval  Hip flexion 3- 3+   Hip extension    Hip abduction    Hip adduction    Hip internal rotation    Hip external rotation    Knee flexion 3+   Knee extension 4- 4  Ankle dorsiflexion 2-   Ankle plantarflexion    Ankle inversion    Ankle eversion    (Blank rows = not tested)  BED MOBILITY:  Modified independent  TRANSFERS: Assistive device utilized: Rollator  Sit to stand: Modified independence Stand to sit: Modified independence   STAIRS:  Not tested due to c/o back pain   GAIT: Gait pattern: decreased step length- Right, decreased stance time- Right, decreased hip/knee flexion- Right, and decreased ankle dorsiflexion- Right Distance walked: 50' Assistive device utilized: with rollator Level of assistance: SBA and CGA Comments: slow gait speed due to c/o back pain and RLE weakness  FUNCTIONAL TESTS:  Sit to stand score - 2 reps - with bil. UE support from chair- 29.91 secs             TUG score: - 49.19 secs with rollator  Gait velocity:  37.38 secs = .88 ft/sec with rollator  PATIENT SURVEYS:  N/A - based on diagnosis of RLE weakness due to chronic lumbar  radiculopathy   TODAY'S TREATMENT: 08-11-24                                                                                                                           Gait: Pt using rollator and wearing Blue rocker AFO on RLE - pt modified independent with use of rollator; ambulating very slowly due to c/o low back pain  Pt amb. Clinic distances with rollator with supervision   TherEx:  Pt performed following exercises in supine position to reduce back pain and to increase strength in low back musc. and ROM of lumbar spine  Pelvic tilt 10 reps 5 sec Pelvic tilt with RLE heel slide with extension 10 reps- no weight;  LLE 10 reps - no weight Hip abdct. In hooklying - bil. LE's 10 reps; no weight Glut squeezes 10 reps - 5 sec hold  In hooklying position - LLE 10 reps pelvic tilt prior to flexing hip: Rt hip flexion 5 reps x 1 set SAQ's bil. LE's 10 reps - no weight used SciFit level 2.5 with bil. UE's and LE's x 10 - Ice pack applied to low back region during this exercise   Medbridge:   Access Code: [email or web address] URL: [email or web address] Date: 05/23/2024 Prepared by: Rock Kussmaul  Exercises - Supine Posterior Pelvic Tilt  - 1 x daily - 7 x weekly - 1 sets - 10 reps - 5 sec hold - Supine Heel Slides  - 1 x daily - 7 x weekly - 1 sets - 10 reps - Lifting leg up/down in this position  - 1 x daily - 7 x weekly - 1 sets - 10 reps - Supine Lower Trunk Rotation  - 1 x daily - 7 x weekly - 1 sets - 5 reps - Supine Bridge with Pelvic Floor Contraction  - 1 x daily - 7 x weekly - 1 sets - 5 reps - 3 sec hold =============================    UPDATED HEP: Access Code: [email or web address] URL: [email or web address] Date: 04/27/2024 Prepared by: Rock Kussmaul  Exercises - Seated Knee Extension with Resistance  - 1 x daily - 7 x weekly - 3 sets - 10 reps - TAP UPS - with Left leg   - 1 x daily - 7 x weekly - 3 sets - 10 reps - Standing March with Counter Support  - 1 x  daily - 7 x weekly - 3 sets - 10 reps - Forward Step Up with Counter Support  - 1 x daily - 7 x weekly - 3 sets - 10 reps - Alternating Step Forward with Support  - 1 x daily - 7 x weekly - 3 sets - 10 reps   PATIENT EDUCATION: Education details:  Medbridge  Person educated: Patient Education method: Explanation - handout declined as pt stated she has it from previous OP admission Education comprehension: verbalized understanding  HOME EXERCISE PROGRAM:   GOALS: Goals reviewed with patient? Yes    UPDATED LONG TERM GOALS: Target date: 06-10-24   Amb. 350' with RW with SBA without requiring seated rest period. Baseline: 134' x 1, 87' x 1 rep - 04-19-24;  measure lobby Goal status: Not assessed on 06-28-24 due to c/o increased low back pain  2.  Improve TUG score to </= 22 secs  with use of RW for improved mobility and reduced fall risk. Baseline: 49.19 secs with rollator;  28.5 secs with rollator - 04-19-24;  43.47 secs with rollator - 06-28-24 Goal status:  Ongoing  3.  Perform 5x sit to stand transfers with LUE support only to demo improved strength in RLE IN </= 25 secs to demo improved functional mobility. Baseline: 36.16 secs - 4x with bil. UE support from chair - had to stop due to burning sensation in low back;  42.97 secs 06-28-24 Goal status:  Ongoing  4.  Increase gait velocity to >/= 1.6 ft/sec with rollator for increased gait efficiency with reduced fall risk.          Baseline: 37.38 secs = .88 ft/sec with rollator;  33.44 secs = .98 ft/sec with rollator;  36.22, 38.09 secs          Goal status:  ONGOING   6.  Independent in updated HEP  for RLE strengthening. Baseline:  Goal status:  Goal met 06-09-24   7.  Report reduced low back pain at rest to </= 3/10 intensity.            Baseline:  7/10;    04-20-24 -- 5/10;    5/10 = 06-09-24;  06-28-24 = 8/10 intensity            Goal status: Not met 06-09-24; not met 06-28-24   UPDATED SHORT TERM GOALS:  TARGET DATE 07-22-24:  1.   Amb. 230' with RW with SBA without requiring seated rest period.             Baseline: 134' x 1, 87' x 1 rep on 04-19-24;  approx. 100' from lobby to mat table in gym with rollator on 06-28-24     Goal status:  Ongoing;  NT 08-02-24 per pt report due to low back pain  2.  Improve TUG score to </= 30 secs  with use of RW for improved mobility and reduced fall risk. Baseline: 49.19 secs with rollator;  28.5 secs with rollator; 43.47 secs on 06-28-24;  36.63 secs on 08-02-24 Goal status:  Not met 08-02-24  3.  Perform 5x sit to stand from chair with bil. UE support in </= 30 secs to demonstrate improved mobility.     Baseline:  36.16 secs - 4 reps with bil. UE support from chair:  42.97 secs 06-28-24    Goal status:  Ongoing;  NT on 08-02-24 due to pt's request    4.  Pt will report reduced pain in low back to </= 5/10 intensity for increased ease with ADL's and mobility.    Baseline:  5/10 on 04-19-24:  8/10 on 06-28-24;  6-7/10 on 08-02-24    Goal status:  Not met 08-02-24   UPDATED LONG TERM GOALS: Target date: 08-19-24   Amb. 350' with RW with SBA without requiring seated rest period. Baseline: 134' x 1, 87' x 1 rep - 04-19-24;  measure lobby Goal status: Not assessed on 06-28-24 due to c/o increased low back pain; Ongoing goal  2.  Improve TUG score to </= 25 secs  with use of RW for improved mobility and reduced fall risk. Baseline: 49.19 secs with rollator;  28.5 secs with rollator - 04-19-24;  43.47 secs with rollator - 06-28-24 Goal status:  Revised   3.  Perform 5x sit to stand transfers with LUE support only to demo improved strength in RLE IN </= 25 secs to demo improved functional mobility. Baseline: 36.16 secs - 4x with bil. UE support from chair - had to stop due to burning sensation in low back;  42.97 secs 06-28-24 Goal status:  Ongoing  4.  Increase gait velocity to >/= 1.6 ft/sec with rollator for increased gait efficiency with reduced fall risk.          Baseline: 37.38 secs = .88 ft/sec  with rollator;  33.44 secs = .98 ft/sec with rollator; 06-28-24 =  36.22, 38.09 secs          Goal status:  ONGOING   6.  Independent in revised/updated HEP for RLE strengthening. Baseline:  Goal status: Revised   7.  Report reduced low back pain at rest to </= 3/10 intensity.            Baseline:  7/10;    04-20-24 -- 5/10;    5/10 = 06-09-24;  06-28-24 = 8/10 intensity  Goal status: Ongoing   ASSESSMENT:   CLINICAL IMPRESSION: PT session focused on gentle low back strengthening and core stabilization exercises.  Pt reports increased low back pain today, rating pain 7-8/10 at start of session.  No weights used for any exercises to minimize risk of increasing pain.  Pt's low back pain impacting functional mobility.  Cont with POC.   OBJECTIVE IMPAIRMENTS: Abnormal gait, decreased balance, decreased coordination, decreased endurance, decreased strength, and impaired tone.  ACTIVITY LIMITATIONS: carrying, bending, squatting, stairs, transfers, and locomotion level  PARTICIPATION LIMITATIONS: cleaning, laundry, shopping, and community activity  PERSONAL FACTORS: Behavior pattern, Past/current experiences, and Time since onset of injury/illness/exacerbation are also affecting patient's functional outcome.   REHAB POTENTIAL: Good  CLINICAL DECISION MAKING: Evolving/moderate complexity  EVALUATION COMPLEXITY: Moderate  PLAN:  PT FREQUENCY: 2x/week  PT DURATION: 8 weeks  PLANNED INTERVENTIONS: Therapeutic exercises, Therapeutic activity, Neuromuscular re-education, Balance training, Gait training, Patient/Family education, Self Care, Stair training, Orthotic/Fit training, DME instructions, and Aquatic Therapy  PLAN FOR NEXT SESSION:  D/C next session:  Continue with low back stretching; ball exercises; pelvic rocking if tolerated    Atarah Cadogan, [place], PT 08/12/2024, 7:18 PM

## 2024-08-12 ENCOUNTER — Encounter: Payer: Self-pay | Admitting: Physical Therapy

## 2024-08-15 ENCOUNTER — Ambulatory Visit: Payer: Self-pay | Admitting: Physical Therapy

## 2024-08-15 DIAGNOSIS — M6281 Muscle weakness (generalized): Secondary | ICD-10-CM

## 2024-08-15 DIAGNOSIS — M5459 Other low back pain: Secondary | ICD-10-CM

## 2024-08-15 DIAGNOSIS — R2689 Other abnormalities of gait and mobility: Secondary | ICD-10-CM

## 2024-08-15 NOTE — Therapy (Unsigned)
 OUTPATIENT PHYSICAL THERAPY NEURO TREATMENT NOTE/DISCHARGE SUMMARY        Patient Name: Donnamae Muilenburg MRN: [id number] DOB:[birth year], 63 y.o., female Today's Date: 06/30/2024   PCP: [place], [name]., MD REFERRING PROVIDER: Babs Arthea DASEN, MD  END OF SESSION:  PT End of Session - 08/16/24 0751     Visit Number 28    Number of Visits 36    Date for Recertification  08/19/24    Authorization Type [employer]    Authorization Time Period 02-16-24 -04-25-24:  04-19-24 - 06-26-24;  06-28-24 - 08-26-24    Progress Note Due on Visit 30    PT Start Time 1017    PT Stop Time 1101    PT Time Calculation (min) 44 min    Activity Tolerance Patient limited by pain    Behavior During Therapy [facility] for tasks assessed/performed                                      Past Medical History:  Diagnosis Date   Anxiety    Aortic atherosclerosis 04/16/2021   Atypical angina    Back pain    related to spinal stenosis and disc problem, radiates down left buttocks to leg., weakness occ.   Chest pain    a. 03/2015 Cath: nl cors; b. 03/2021 Cor CTA: Ca2+ = 0. Nl Cors.   Chronic pain syndrome    Dyspnea    hx   GERD (gastroesophageal reflux disease)    Grade I diastolic dysfunction    Headache    Hyperlipidemia    Hypertension    Lumbar post-laminectomy syndrome    LVH (left ventricular hypertrophy) 12/15/2020   a. 11/2020 Echo: EF 65-70%, no rwma, sev asymm LVH with IVSd 1.9 cm. No LVOT obs @ rest. Gr1 DD. Triv MR.   PONV (postoperative nausea and vomiting)    Pulmonary nodules    a. 03/2021 CT Chest: 3mm pulm nodules in bilat lower lobes. F/u 1 yr.   Right foot drop    Syncope    a. 11/2020 Zio: No significant arrhythmias.   Vaginal foreign object    Uses Femring    Past Surgical History:  Procedure Laterality Date   ABDOMINAL HYSTERECTOMY     ANTERIOR CERVICAL DECOMP/DISCECTOMY FUSION N/A 01/03/2022   Procedure: Anterior Cervical Decompression  Fusion - Cervical four-Cervical five - Cervical five-Cervical six;  Surgeon: Joshua Alm RAMAN, MD;  Location: [facility] OR;  Service: Neurosurgery;  Laterality: N/A;   BIOPSY  12/16/2020   Procedure: BIOPSY;  Surgeon: Aneita Gwendlyn DASEN, MD;  Location: [facility] ENDOSCOPY;  Service: Endoscopy;;   CARDIAC CATHETERIZATION N/A 04/18/2015   Procedure: Left Heart Cath and Coronary Angiography;  Surgeon: Rober Chroman, MD;  Location: [facility] INVASIVE CV LAB;  Service: Cardiovascular;  Laterality: N/A;   COLONOSCOPY     COLONOSCOPY W/ BIOPSIES AND POLYPECTOMY  2018   ESOPHAGOGASTRODUODENOSCOPY (EGD) WITH PROPOFOL  N/A 12/16/2020   Procedure: ESOPHAGOGASTRODUODENOSCOPY (EGD) WITH PROPOFOL ;  Surgeon: Aneita Gwendlyn DASEN, MD;  Location: [facility] ENDOSCOPY;  Service: Endoscopy;  Laterality: N/A;   FOOT SURGERY Bilateral    [facility] bunion,bone spur, tendon (1) -6'16, (1)-10'16   HEMATOMA EVACUATION N/A 01/05/2022   Procedure: Cervical Wound Exploration;  Surgeon: Gillie Duncans, MD;  Location: [facility] OR;  Service: Neurosurgery;  Laterality: N/A;   IR EPIDUROGRAPHY  07/21/2018   LUMBAR LAMINECTOMY/DECOMPRESSION MICRODISCECTOMY Bilateral 12/28/2015   Procedure: MICRO LUMBAR DECOMPRESSION  L4 - L5 BILATERALLY;  Surgeon: Reyes Billing, MD;  Location: [facility] ORS;  Service: Orthopedics;  Laterality: Bilateral;   LUMBAR LAMINECTOMY/DECOMPRESSION MICRODISCECTOMY Bilateral 03/04/2018   Procedure: Revision of Microlumbar Decompression Bilateral Lumbar Four-Five;  Surgeon: Billing Reyes, MD;  Location: [facility] OR;  Service: Orthopedics;  Laterality: Bilateral;  90 mins   LUMBAR WOUND DEBRIDEMENT N/A 12/25/2023   Procedure: Irrigation and debridement of lumbar wound;  Surgeon: Joshua Alm Hamilton, MD;  Location: [facility] OR;  Service: Neurosurgery;  Laterality: N/A;   POSTERIOR FUSION PEDICLE SCREW PLACEMENT N/A 12/07/2023   Procedure: LUMBAR FOUR-FIVE POSTERIOR LATERAL FUSION;  Surgeon: Joshua Alm Hamilton, MD;  Location: [facility] OR;  Service: Neurosurgery;   Laterality: N/A;   SAVORY DILATION N/A 12/16/2020   Procedure: SAVORY DILATION;  Surgeon: Aneita Gwendlyn DASEN, MD;  Location: [facility] ENDOSCOPY;  Service: Endoscopy;  Laterality: N/A;   SPINAL CORD STIMULATOR INSERTION N/A 09/28/2019   Procedure: THORACIC SPINAL CORD STIMULATOR INSERTION;  Surgeon: Burnetta Aures, MD;  Location: [facility] OR;  Service: Orthopedics;  Laterality: N/A;  2.5 hrs   SPINAL CORD STIMULATOR REMOVAL N/A 05/27/2021   Procedure: LUMBAR SPINAL CORD STIMULATOR REMOVAL;  Surgeon: Bluford Standing, MD;  Location: [facility] ORS;  Service: Neurosurgery;  Laterality: N/A;   TUBAL LIGATION     WISDOM TOOTH EXTRACTION     WOUND EXPLORATION N/A 03/04/2018   Procedure: EXPLORATION OF LUMBAR DECOMPRESSION WOUND;  Surgeon: Billing Reyes, MD;  Location: [facility] OR;  Service: Orthopedics;  Laterality: N/A;   Patient Active Problem List   Diagnosis Date Noted   Vitamin D  deficiency, unspecified 04/13/2024   Seasonal allergic rhinitis due to pollen 01/29/2024   Delayed wound healing 12/25/2023   S/P lumbar fusion 12/07/2023   Pain in thumb joint with movement of right hand 08/12/2023   Localized primary osteoarthritis of carpometacarpal joint of right thumb 07/29/2023   Right foot drop 07/29/2023   Frequent falls 04/14/2023   Gastroesophageal reflux disease 04/14/2023   Right upper extremity numbness 09/29/2022   Stage 3a chronic kidney disease (HCC) 09/29/2022   Cervical spondylosis with radiculopathy 01/05/2022   S/P cervical spinal fusion 01/03/2022   Cervical disc disorder with radiculopathy of cervical region 11/27/2021   Uncontrolled pain 05/27/2021   Syncope 05/13/2021   Depression with anxiety 05/13/2021   Chronic diastolic CHF (congestive heart failure) (HCC) 05/13/2021   Chronic back pain 05/13/2021   Chest tightness 05/13/2021   Lower abdominal pain 05/13/2021   Syncope and collapse 01/01/2021   Abnormal echocardiogram 01/01/2021   BPPV (benign paroxysmal positional vertigo), left  12/26/2020   Odynophagia    Dysphagia 12/15/2020   Postural dizziness with presyncope 12/14/2020   Lumbar post-laminectomy syndrome    Chronic pain 09/28/2019   Lumbar spinal stenosis    Radicular pain    Gait disorder    Difficulty in urination 07/13/2018   Back pain 07/12/2018   Hyperlipidemia 04/16/2018   GAD (generalized anxiety disorder) 04/16/2018   AKI (acute kidney injury)    Benign essential HTN    Major depression, recurrent    Lumbar radiculopathy 03/09/2018   Paraparesis (HCC) 03/09/2018   Essential hypertension    Chronic nonintractable headache    Leukocytosis    Acute blood loss anemia    Postoperative pain    Generalized weakness    Spinal stenosis at L4-L5 level 03/04/2018   Spinal stenosis of lumbar region 12/28/2015   Chest pain 04/16/2015    ONSET DATE: Most recent Surgery date 12-07-23:  Referral date 02-03-24  REFERRING  DIAG:  M54.16 (ICD-10-CM) - Lumbar radiculopathy M96.1 (ICD-10-CM) - Lumbar post-laminectomy syndrome R26.9 (ICD-10-CM) - Gait disorder  THERAPY DIAG:  Other low back pain  Muscle weakness (generalized)  Other abnormalities of gait and mobility  Rationale for Evaluation and Treatment: Rehabilitation  SUBJECTIVE:                                                                                                                                                                                             SUBJECTIVE STATEMENT: Pt reports she was offered an appt at [facility] for dry needling as ordered by Dr. Joshua but states there would be an additional $50 charge for this treatment and pt reports financially she feels she can't afford that at this time as it would likely require several visits and not just a one time charge.  Pt was informed that charge at this facility would be $44 for this treatment modality.  Pt states her back is hurting worse today - attributes it to the cold weather.  Pt forgot to wear her wrist splint  for her RUE today.  Has surgery scheduled on 08-29-24 for Rt wrist for the CTS. Discharge today due to end of POC with surgery for RUE scheduled and also due to Dr. Joshua referring pt to another facility for dry needling for back pain management.   Pt accompanied by: self  PERTINENT HISTORY: The patient was admitted on 12/07/2023 &  underwent PLIF L4-5. Hospitalized 12-07-23 - 12-11-23:  The patient was admitted on 12/25/2023 and taken to the operating room where the patient underwent irrigation and debridement of lumbar wound with primary closure and placement of external wound VAC- hospitalized 12-25-23 -  12-26-23  Frequent falls:  Spinal cord stimulator insertion on 09-28-19; stimulator removal on 05-27-21 with resultant LLE weakness;  decompression and laminectomy on 12/28/15 at L4-L5  with redo decompression surgery on May 9,2019, post-op paraparesis with surgery to remove small hematoma; 07/12/18 fall at work (involved rolling chair) with hospital admission, transfer to CIR with d/c home 08/03/18; HTN, Bil foot surgery; left ventricular hypertrophy, chronic pain syndrome, dyspnea, hypertrophic cardiomyopathy; cervical fusion March 2023;  had carpal tunnel surgery RUE in April 2024:  LVH  PAIN:  Are you having pain? Pt rates pain 7/10 in lumbar region - greater pain in lower lumbar region near sacral area- same burning sensation Alleviating Factor:  placing pillow behind back when sitting Aggravating Factors: certain movements  08-15-24:  Rt hand pain - Rt thenar eminence and lateral distal forearm; rates pain severe intensity - must modify technique with sit to stand transfer due to inability to  push up with RUE; pt forgot to wear splint   PRECAUTIONS: Fall; No bending/lifting or twisting; pt states she has a reacher  WEIGHT BEARING RESTRICTIONS: No  FALLS: Has patient fallen in last 6 months? Yes. Number of falls 3  LIVING ENVIRONMENT: Lives with: lives with their spouse Lives in:  House/apartment Stairs: Yes: External: 5 steps; on right going up Has following equipment at home: Single point cane, Wheelchair (manual), and L[place]strand crutch  PLOF: Independent with basic ADLs, Independent with household mobility with device, Independent with community mobility with device, and Independent with transfers  PATIENT GOALS: improve walking and strength Rt leg  OBJECTIVE:   DIAGNOSTIC FINDINGS: IMPRESSION: 1. Interval anterior discectomy and fusion from C4 through C6 with improved patency of the spinal canal and only mild residual foraminal narrowing. 2. Stable mild spinal stenosis and mild to moderate left foraminal narrowing at C3-4. 3. Stable mild spinal stenosis and mild foraminal narrowing bilaterally at C6-7. Mild foraminal narrowing bilaterally at C7-T1. 4. No cord deformity or abnormal cord signal.  No acute findings. No recent MRI of lumbar region  COGNITION: Overall cognitive status: Within functional limits for tasks assessed   SENSATION: WFL  COORDINATION: Decreased RLE due to weakness and spasticity  POSTURE: No Significant postural limitations  LOWER EXTREMITY ROM:   LLE WFL's;  RLE WFL's except for ankle ROM which is minimal due to weakness; Pt has difficulty performing Rt knee extension due to Rt quad weakness and spasticity - turned body slightly to Lt to achieve more knee extension   LOWER EXTREMITY MMT:    MMT Right Eval Left Eval  Hip flexion 3- 3+   Hip extension    Hip abduction    Hip adduction    Hip internal rotation    Hip external rotation    Knee flexion 3+   Knee extension 4- 4  Ankle dorsiflexion 2-   Ankle plantarflexion    Ankle inversion    Ankle eversion    (Blank rows = not tested)  BED MOBILITY:  Modified independent  TRANSFERS: Assistive device utilized: Rollator  Sit to stand: Modified independence Stand to sit: Modified independence   STAIRS:  Not tested due to c/o back pain   GAIT: Gait pattern:  decreased step length- Right, decreased stance time- Right, decreased hip/knee flexion- Right, and decreased ankle dorsiflexion- Right Distance walked: 50' Assistive device utilized: with rollator Level of assistance: SBA and CGA Comments: slow gait speed due to c/o back pain and RLE weakness  FUNCTIONAL TESTS:  Sit to stand score - 2 reps - with bil. UE support from chair- 29.91 secs             TUG score: - 49.19 secs with rollator  Gait velocity:  37.38 secs = .88 ft/sec with rollator  PATIENT SURVEYS:  N/A - based on diagnosis of RLE weakness due to chronic lumbar radiculopathy   TODAY'S TREATMENT: 08-15-24  Gait: Pt using rollator and wearing Blue rocker AFO on RLE - pt modified independent with use of rollator; ambulating very slowly due to c/o low back pain  Pt amb. Clinic distances with rollator with supervision  Self Care: Discussed LTG's and current status; pt forgot to wear her wrist splint for her RUE today so unable to tolerate performing 5x sit to stand transfers and other activities for LTG assessment; pt also c/o increased low back pain (attributes partially due to the cold weather this morning);    TherEx:  Pt performed following exercises in supine position to reduce back pain and to increase strength in low back musc. and ROM of lumbar spine  Pelvic tilt 10 reps 5 sec Pelvic tilt with RLE heel slide with extension 10 reps- no weight;  LLE 10 reps - no weight Hip abdct. In hooklying - bil. LE's 10 reps; no weight Glut squeezes 10 reps - 5 sec hold  In hooklying position - LLE 10 reps pelvic tilt prior to flexing hip: Rt hip flexion 2 reps x 1 set only due to c/o back pain with this exercise SAQ's bil. LE's 10 reps - no weight used  Medbridge:   Access Code: [email or web address] URL: [email or web address] Date: 05/23/2024 Prepared by:  Rock Kussmaul  Exercises - Supine Posterior Pelvic Tilt  - 1 x daily - 7 x weekly - 1 sets - 10 reps - 5 sec hold - Supine Heel Slides  - 1 x daily - 7 x weekly - 1 sets - 10 reps - Lifting leg up/down in this position  - 1 x daily - 7 x weekly - 1 sets - 10 reps - Supine Lower Trunk Rotation  - 1 x daily - 7 x weekly - 1 sets - 5 reps - Supine Bridge with Pelvic Floor Contraction  - 1 x daily - 7 x weekly - 1 sets - 5 reps - 3 sec hold =============================    UPDATED HEP: Access Code: [email or web address] URL: [email or web address] Date: 04/27/2024 Prepared by: Rock Kussmaul  Exercises - Seated Knee Extension with Resistance  - 1 x daily - 7 x weekly - 3 sets - 10 reps - TAP UPS - with Left leg   - 1 x daily - 7 x weekly - 3 sets - 10 reps - Standing March with Counter Support  - 1 x daily - 7 x weekly - 3 sets - 10 reps - Forward Step Up with Counter Support  - 1 x daily - 7 x weekly - 3 sets - 10 reps - Alternating Step Forward with Support  - 1 x daily - 7 x weekly - 3 sets - 10 reps   PATIENT EDUCATION: Education details:  Medbridge  Person educated: Patient Education method: Explanation - handout declined as pt stated she has it from previous OP admission Education comprehension: verbalized understanding  HOME EXERCISE PROGRAM:   GOALS: Goals reviewed with patient? Yes    UPDATED LONG TERM GOALS: Target date: 06-10-24   Amb. 350' with RW with SBA without requiring seated rest period. Baseline: 134' x 1, 87' x 1 rep - 04-19-24;  measure lobby Goal status: Not assessed on 06-28-24 due to c/o increased low back pain  2.  Improve TUG score to </= 22 secs  with use of RW for improved mobility and reduced fall risk. Baseline: 49.19 secs with rollator;  28.5 secs with rollator - 04-19-24;  43.47 secs with rollator - 06-28-24 Goal status:  Ongoing  3.  Perform  5x sit to stand transfers with LUE support only to demo improved strength in RLE IN </= 25 secs to demo  improved functional mobility. Baseline: 36.16 secs - 4x with bil. UE support from chair - had to stop due to burning sensation in low back;  42.97 secs 06-28-24 Goal status:  Ongoing  4.  Increase gait velocity to >/= 1.6 ft/sec with rollator for increased gait efficiency with reduced fall risk.          Baseline: 37.38 secs = .88 ft/sec with rollator;  33.44 secs = .98 ft/sec with rollator;  36.22, 38.09 secs          Goal status:  ONGOING   6.  Independent in updated HEP for RLE strengthening. Baseline:  Goal status:  Goal met 06-09-24   7.  Report reduced low back pain at rest to </= 3/10 intensity.            Baseline:  7/10;    04-20-24 -- 5/10;    5/10 = 06-09-24;  06-28-24 = 8/10 intensity            Goal status: Not met 06-09-24; not met 06-28-24   UPDATED SHORT TERM GOALS:  TARGET DATE 07-22-24:  1.  Amb. 230' with RW with SBA without requiring seated rest period.             Baseline: 134' x 1, 87' x 1 rep on 04-19-24;  approx. 100' from lobby to mat table in gym with rollator on 06-28-24     Goal status:  Ongoing;  NT 08-02-24 per pt report due to low back pain  2.  Improve TUG score to </= 30 secs  with use of RW for improved mobility and reduced fall risk. Baseline: 49.19 secs with rollator;  28.5 secs with rollator; 43.47 secs on 06-28-24;  36.63 secs on 08-02-24 Goal status:  Not met 08-02-24  3.  Perform 5x sit to stand from chair with bil. UE support in </= 30 secs to demonstrate improved mobility.     Baseline:  36.16 secs - 4 reps with bil. UE support from chair:  42.97 secs 06-28-24    Goal status:  Ongoing;  NT on 08-02-24 due to pt's request    4.  Pt will report reduced pain in low back to </= 5/10 intensity for increased ease with ADL's and mobility.    Baseline:  5/10 on 04-19-24:  8/10 on 06-28-24;  6-7/10 on 08-02-24    Goal status:  Not met 08-02-24   UPDATED LONG TERM GOALS: Target date: 08-19-24   Amb. 350' with RW with SBA without requiring seated rest period. Baseline:  134' x 1, 87' x 1 rep - 04-19-24;  Goal status: Not assessed on 06-28-24 due to c/o increased low back pain:  08-15-24 Goal deferred due to c/o increased low back pain and pt not having Rt wrist splint today   2.  Improve TUG score to </= 25 secs  with use of RW for improved mobility and reduced fall risk. Baseline: 49.19 secs with rollator;  28.5 secs with rollator - 04-19-24;  43.47 secs with rollator - 06-28-24 Goal status:  Deferred - pt does not have Rt wrist splint - score would be impacted  3.  Perform 5x sit to stand transfers with LUE support only to demo improved strength in RLE IN </= 25 secs to demo improved functional mobility. Baseline: 36.16 secs - 4x with bil. UE support from chair - had  to stop due to burning sensation in low back;  42.97 secs 06-28-24 Goal status:  Deferred - score would be impacted by Rt wrist pain  4.  Increase gait velocity to >/= 1.6 ft/sec with rollator for increased gait efficiency with reduced fall risk.          Baseline: 37.38 secs = .88 ft/sec with rollator;  33.44 secs = .98 ft/sec with rollator; 06-28-24 =  36.22, 38.09 secs          Goal status:  Deferred   6.  Independent in revised/updated HEP for RLE strengthening. Baseline:  Goal status: Met 08-15-24   7.  Report reduced low back pain at rest to </= 3/10 intensity.            Baseline:  7/10;    04-20-24 -- 5/10;    5/10 = 06-09-24;  06-28-24 = 8/10 intensity; 08-15-24 = 7/10             Goal status: Not met 08-15-24    ASSESSMENT:   CLINICAL IMPRESSION: Pt has not met any LTG's due to exacerbation of CTS of RUE - pt is scheduled for surgery on 08-29-24; pt forgot to wear her wrist splint to today's session, therefore, unable to accurately assess 5x sit to stand and TUG as these scores would be significantly impacted by Rt wrist pain.  Pt also reporting increased low back pain at 7/10 intensity in today's session.  Pt is discharged due to end of this certification period and due to lack of progress due  to increased low back pain experienced since she sustained a fall during her hospitalization (06-18-24) for Covid.  Low back pain intensity since this incident as remained in range 6-8/10.  Pt reports Dr. Joshua referred her to [facility] for dry needling, however, pt reports she does not feel she can afford the additional cost of this treatment modality at this time.  Pt is discharged due to end of cert period with change in status and also due to plateau in maximizing functional status at this time.  OBJECTIVE IMPAIRMENTS: Abnormal gait, decreased balance, decreased coordination, decreased endurance, decreased strength, and impaired tone.  ACTIVITY LIMITATIONS: carrying, bending, squatting, stairs, transfers, and locomotion level  PARTICIPATION LIMITATIONS: cleaning, laundry, shopping, and community activity  PERSONAL FACTORS: Behavior pattern, Past/current experiences, and Time since onset of injury/illness/exacerbation are also affecting patient's functional outcome.   REHAB POTENTIAL: Good  CLINICAL DECISION MAKING: Evolving/moderate complexity  EVALUATION COMPLEXITY: Moderate  PLAN:  PT FREQUENCY: 2x/week  PT DURATION: 8 weeks  PLANNED INTERVENTIONS: Therapeutic exercises, Therapeutic activity, Neuromuscular re-education, Balance training, Gait training, Patient/Family education, Self Care, Stair training, Orthotic/Fit training, DME instructions, and Aquatic Therapy  PLAN FOR NEXT SESSION:  D/C 08-15-24   PHYSICAL THERAPY DISCHARGE SUMMARY  Visits from Start of Care: 28   Current functional level related to goals / functional outcomes: See above for progress (lack of progress towards LTG's) due to increased low back pain experienced since fall sustained in hospital on 06-18-24.   Remaining deficits: Continued dependency with gait with pt using rollator Continued low back pain with intensity rating 7/10 in today's session New onset of RUE pain due to CTS  -  surgery is scheduled for 08-29-24   Education / Equipment: Pt has been instructed in HEP for gentle low back stretching and strengthening   Patient agrees to discharge. Patient goals were not met. Patient is being discharged due to a change in medical status.  Pt  is scheduled for   Pt has not met any LTG's due to exacerbation of CTS of RUE - pt is scheduled for surgery on 08-29-24; pt forgot to wear her wrist splint to today's session, therefore, unable to accurately assess 5x sit to stand and TUG as these scores would be significantly impacted by Rt wrist pain.  Pt also reporting increased low back pain at 7/10 intensity in today's session.  Pt is discharged due to end of this certification period and due to lack of progress due to increased low back pain experienced since she sustained a fall during her hospitalization (06-18-24) for Covid.  Low back pain intensity since this incident as remained in range 6-8/10.  Pt reports Dr. Joshua referred her to [facility] for dry needling, however, pt reports she does not feel she can afford the additional cost of this treatment modality at this time.  Pt is discharged due to end of cert period with change in status and also due to plateau in maximizing functional status at this time due to c/o moderate ongoing low back pain which impacts functional mobility.   Roxanna [place], PT 08/16/2024, 7:53 AM

## 2024-08-16 ENCOUNTER — Encounter: Payer: Self-pay | Admitting: Physical Therapy

## 2024-08-17 ENCOUNTER — Encounter: Payer: Self-pay | Admitting: Physical Medicine & Rehabilitation

## 2024-08-17 ENCOUNTER — Other Ambulatory Visit (HOSPITAL_COMMUNITY): Payer: Self-pay

## 2024-08-17 ENCOUNTER — Encounter: Attending: Physical Medicine & Rehabilitation | Admitting: Physical Medicine & Rehabilitation

## 2024-08-17 VITALS — BP 140/94 | HR 97 | Ht 65.0 in | Wt 163.0 lb

## 2024-08-17 DIAGNOSIS — M961 Postlaminectomy syndrome, not elsewhere classified: Secondary | ICD-10-CM | POA: Diagnosis not present

## 2024-08-17 DIAGNOSIS — M5416 Radiculopathy, lumbar region: Secondary | ICD-10-CM | POA: Insufficient documentation

## 2024-08-17 MED ORDER — TIZANIDINE HCL 2 MG PO TABS
2.0000 mg | ORAL_TABLET | Freq: Three times a day (TID) | ORAL | 3 refills | Status: AC
  Filled 2024-08-17: qty 90, 30d supply, fill #0

## 2024-08-17 MED ORDER — TAPENTADOL HCL 50 MG PO TABS: 50.0000 mg | ORAL_TABLET | Freq: Four times a day (QID) | ORAL | Status: AC | PRN

## 2024-08-17 NOTE — Progress Notes (Signed)
 Subjective:    Patient ID: [name], female    DOB: 07-27-1961, 63 y.o.   MRN: [id number]  HPI  [name] is here in follow-up of her chronic back pain and associated gait disorder and other issues. She was in the hospital in August with COVID. Apparently she fell while in the hospital.   [name] is back in therapy for her gait and back. She is at [facility] for therapies to address her back pain .  She still having a lot of pain at the surgical site as well as some of the other nearby areas in her back.  She has some radiation of the pain into the right buttocks.  Her muscle relaxants helped to a certain extent.  Nucynta  seems to help as well although it was held due to her starting on a cough medicine with codeine.  She remains on gabapentin  as prescribed in addition to duloxetine .  She uses Robaxin  and baclofen  as needed for muscle spasms.   Pain Inventory Average Pain 6 Pain Right Now 6 My pain is sharp, burning, tingling, and aching  In the last 24 hours, has pain interfered with the following? General activity 4 Relation with others 3 Enjoyment of life 3 What TIME of day is your pain at its worst? morning , evening, and night Sleep (in general) Fair  Pain is worse with: bending, sitting, standing, and some activites Pain improves with: heat/ice, therapy/exercise, and medication Relief from Meds: 7  Family History  Problem Relation Age of Onset   Heart attack Mother    Lung cancer Father    Cancer Father    Pancreatic cancer Sister    Breast cancer Sister 36   Multiple myeloma Sister    Breast cancer Sister        diagnosed in her 1's   Heart attack Sister    Throat cancer Brother    Lung cancer Brother    Stomach cancer Cousin    Colon cancer Neg Hx    Colon polyps Neg Hx    Esophageal cancer Neg Hx    Rectal cancer Neg Hx    Social History   Socioeconomic History   Marital status: Married    Spouse name: Soil scientist   Number of children: 1    Years of education: Not on file   Highest education level: Not on file  Occupational History   Not on file  Tobacco Use   Smoking status: Never   Smokeless tobacco: Never  Vaping Use   Vaping status: Never Used  Substance and Sexual Activity   Alcohol use: Never   Drug use: Never   Sexual activity: Yes    Partners: Male    Birth control/protection: Post-menopausal, Surgical    Comment: Hysterectomy  Other Topics Concern   Not on file  Social History Narrative   Not on file   Social Drivers of Health   Financial Resource Strain: Low Risk  (05/02/2024)   Overall Financial Resource Strain (CARDIA)    Difficulty of Paying Living Expenses: Not very hard  Food Insecurity: No Food Insecurity (06/17/2024)   Hunger Vital Sign    Worried About Running Out of Food in the Last Year: Never true    Ran Out of Food in the Last Year: Never true  Transportation Needs: No Transportation Needs (06/17/2024)   PRAPARE - Transportation    Lack of Transportation (Medical): No    Lack of Transportation (Non-Medical): No  Physical Activity: Insufficiently Active (  05/02/2024)   Exercise Vital Sign    Days of Exercise per Week: 2 days    Minutes of Exercise per Session: 30 min  Stress: No Stress Concern Present (10/02/2023)   [employer] of Occupational Health - Occupational Stress Questionnaire    Feeling of Stress : Not at all  Social Connections: Socially Integrated (06/14/2024)   Social Connection and Isolation Panel    Frequency of Communication with Friends and Family: More than three times a week    Frequency of Social Gatherings with Friends and Family: More than three times a week    Attends Religious Services: More than 4 times per year    Active Member of [employer] or Organizations: Yes    Attends Engineer, structural: More than 4 times per year    Marital Status: Married   Past Surgical History:  Procedure Laterality Date   ABDOMINAL HYSTERECTOMY     ANTERIOR CERVICAL  DECOMP/DISCECTOMY FUSION N/A 01/03/2022   Procedure: Anterior Cervical Decompression Fusion - Cervical four-Cervical five - Cervical five-Cervical six;  Surgeon: Joshua Alm RAMAN, MD;  Location: [facility] OR;  Service: Neurosurgery;  Laterality: N/A;   BIOPSY  12/16/2020   Procedure: BIOPSY;  Surgeon: Aneita Gwendlyn DASEN, MD;  Location: [facility] ENDOSCOPY;  Service: Endoscopy;;   CARDIAC CATHETERIZATION N/A 04/18/2015   Procedure: Left Heart Cath and Coronary Angiography;  Surgeon: Rober Chroman, MD;  Location: [facility] INVASIVE CV LAB;  Service: Cardiovascular;  Laterality: N/A;   COLONOSCOPY     COLONOSCOPY W/ BIOPSIES AND POLYPECTOMY  2018   ESOPHAGOGASTRODUODENOSCOPY (EGD) WITH PROPOFOL  N/A 12/16/2020   Procedure: ESOPHAGOGASTRODUODENOSCOPY (EGD) WITH PROPOFOL ;  Surgeon: Aneita Gwendlyn DASEN, MD;  Location: [facility] ENDOSCOPY;  Service: Endoscopy;  Laterality: N/A;   FOOT SURGERY Bilateral    [facility] bunion,bone spur, tendon (1) -6'16, (1)-10'16   HEMATOMA EVACUATION N/A 01/05/2022   Procedure: Cervical Wound Exploration;  Surgeon: Gillie Duncans, MD;  Location: [facility] OR;  Service: Neurosurgery;  Laterality: N/A;   IR EPIDUROGRAPHY  07/21/2018   LUMBAR LAMINECTOMY/DECOMPRESSION MICRODISCECTOMY Bilateral 12/28/2015   Procedure: MICRO LUMBAR DECOMPRESSION L4 - L5 BILATERALLY;  Surgeon: Reyes Billing, MD;  Location: [facility] ORS;  Service: Orthopedics;  Laterality: Bilateral;   LUMBAR LAMINECTOMY/DECOMPRESSION MICRODISCECTOMY Bilateral 03/04/2018   Procedure: Revision of Microlumbar Decompression Bilateral Lumbar Four-Five;  Surgeon: Billing Reyes, MD;  Location: [facility] OR;  Service: Orthopedics;  Laterality: Bilateral;  90 mins   LUMBAR WOUND DEBRIDEMENT N/A 12/25/2023   Procedure: Irrigation and debridement of lumbar wound;  Surgeon: Joshua Alm Hamilton, MD;  Location: [facility] OR;  Service: Neurosurgery;  Laterality: N/A;   POSTERIOR FUSION PEDICLE SCREW PLACEMENT N/A 12/07/2023   Procedure: LUMBAR FOUR-FIVE POSTERIOR LATERAL FUSION;   Surgeon: Joshua Alm Hamilton, MD;  Location: [facility] OR;  Service: Neurosurgery;  Laterality: N/A;   SAVORY DILATION N/A 12/16/2020   Procedure: SAVORY DILATION;  Surgeon: Aneita Gwendlyn DASEN, MD;  Location: [facility] ENDOSCOPY;  Service: Endoscopy;  Laterality: N/A;   SPINAL CORD STIMULATOR INSERTION N/A 09/28/2019   Procedure: THORACIC SPINAL CORD STIMULATOR INSERTION;  Surgeon: Burnetta Aures, MD;  Location: [facility] OR;  Service: Orthopedics;  Laterality: N/A;  2.5 hrs   SPINAL CORD STIMULATOR REMOVAL N/A 05/27/2021   Procedure: LUMBAR SPINAL CORD STIMULATOR REMOVAL;  Surgeon: Bluford Standing, MD;  Location: [facility] ORS;  Service: Neurosurgery;  Laterality: N/A;   TUBAL LIGATION     WISDOM TOOTH EXTRACTION     WOUND EXPLORATION N/A 03/04/2018   Procedure: EXPLORATION OF LUMBAR DECOMPRESSION WOUND;  Surgeon:  Duwayne Purchase, MD;  Location: [facility] OR;  Service: Orthopedics;  Laterality: N/A;   Past Surgical History:  Procedure Laterality Date   ABDOMINAL HYSTERECTOMY     ANTERIOR CERVICAL DECOMP/DISCECTOMY FUSION N/A 01/03/2022   Procedure: Anterior Cervical Decompression Fusion - Cervical four-Cervical five - Cervical five-Cervical six;  Surgeon: Joshua Alm RAMAN, MD;  Location: [facility] OR;  Service: Neurosurgery;  Laterality: N/A;   BIOPSY  12/16/2020   Procedure: BIOPSY;  Surgeon: Aneita Gwendlyn DASEN, MD;  Location: [facility] ENDOSCOPY;  Service: Endoscopy;;   CARDIAC CATHETERIZATION N/A 04/18/2015   Procedure: Left Heart Cath and Coronary Angiography;  Surgeon: Rober Chroman, MD;  Location: [facility] INVASIVE CV LAB;  Service: Cardiovascular;  Laterality: N/A;   COLONOSCOPY     COLONOSCOPY W/ BIOPSIES AND POLYPECTOMY  2018   ESOPHAGOGASTRODUODENOSCOPY (EGD) WITH PROPOFOL  N/A 12/16/2020   Procedure: ESOPHAGOGASTRODUODENOSCOPY (EGD) WITH PROPOFOL ;  Surgeon: Aneita Gwendlyn DASEN, MD;  Location: [facility] ENDOSCOPY;  Service: Endoscopy;  Laterality: N/A;   FOOT SURGERY Bilateral    [facility] bunion,bone spur, tendon (1) -6'16, (1)-10'16    HEMATOMA EVACUATION N/A 01/05/2022   Procedure: Cervical Wound Exploration;  Surgeon: Gillie Duncans, MD;  Location: [facility] OR;  Service: Neurosurgery;  Laterality: N/A;   IR EPIDUROGRAPHY  07/21/2018   LUMBAR LAMINECTOMY/DECOMPRESSION MICRODISCECTOMY Bilateral 12/28/2015   Procedure: MICRO LUMBAR DECOMPRESSION L4 - L5 BILATERALLY;  Surgeon: Purchase Duwayne, MD;  Location: [facility] ORS;  Service: Orthopedics;  Laterality: Bilateral;   LUMBAR LAMINECTOMY/DECOMPRESSION MICRODISCECTOMY Bilateral 03/04/2018   Procedure: Revision of Microlumbar Decompression Bilateral Lumbar Four-Five;  Surgeon: Duwayne Purchase, MD;  Location: [facility] OR;  Service: Orthopedics;  Laterality: Bilateral;  90 mins   LUMBAR WOUND DEBRIDEMENT N/A 12/25/2023   Procedure: Irrigation and debridement of lumbar wound;  Surgeon: Joshua Alm Hamilton, MD;  Location: [facility] OR;  Service: Neurosurgery;  Laterality: N/A;   POSTERIOR FUSION PEDICLE SCREW PLACEMENT N/A 12/07/2023   Procedure: LUMBAR FOUR-FIVE POSTERIOR LATERAL FUSION;  Surgeon: Joshua Alm Hamilton, MD;  Location: [facility] OR;  Service: Neurosurgery;  Laterality: N/A;   SAVORY DILATION N/A 12/16/2020   Procedure: SAVORY DILATION;  Surgeon: Aneita Gwendlyn DASEN, MD;  Location: [facility] ENDOSCOPY;  Service: Endoscopy;  Laterality: N/A;   SPINAL CORD STIMULATOR INSERTION N/A 09/28/2019   Procedure: THORACIC SPINAL CORD STIMULATOR INSERTION;  Surgeon: Burnetta Aures, MD;  Location: [facility] OR;  Service: Orthopedics;  Laterality: N/A;  2.5 hrs   SPINAL CORD STIMULATOR REMOVAL N/A 05/27/2021   Procedure: LUMBAR SPINAL CORD STIMULATOR REMOVAL;  Surgeon: Bluford Standing, MD;  Location: [facility] ORS;  Service: Neurosurgery;  Laterality: N/A;   TUBAL LIGATION     WISDOM TOOTH EXTRACTION     WOUND EXPLORATION N/A 03/04/2018   Procedure: EXPLORATION OF LUMBAR DECOMPRESSION WOUND;  Surgeon: Duwayne Purchase, MD;  Location: [facility] OR;  Service: Orthopedics;  Laterality: N/A;   Past Medical History:  Diagnosis Date   Anxiety    Aortic  atherosclerosis 04/16/2021   Atypical angina    Back pain    related to spinal stenosis and disc problem, radiates down left buttocks to leg., weakness occ.   Chest pain    a. 03/2015 Cath: nl cors; b. 03/2021 Cor CTA: Ca2+ = 0. Nl Cors.   Chronic pain syndrome    Dyspnea    hx   GERD (gastroesophageal reflux disease)    Grade I diastolic dysfunction    Headache    Hyperlipidemia    Hypertension    Lumbar post-laminectomy syndrome    LVH (left ventricular hypertrophy)  12/15/2020   a. 11/2020 Echo: EF 65-70%, no rwma, sev asymm LVH with IVSd 1.9 cm. No LVOT obs @ rest. Gr1 DD. Triv MR.   PONV (postoperative nausea and vomiting)    Pulmonary nodules    a. 03/2021 CT Chest: 3mm pulm nodules in bilat lower lobes. F/u 1 yr.   Right foot drop    Syncope    a. 11/2020 Zio: No significant arrhythmias.   Vaginal foreign object    Uses Femring    BP (!) 140/94   Pulse 97   Ht 5' 5 (1.651 m)   Wt 163 lb (73.9 kg)   SpO2 98%   BMI 27.12 kg/m   Opioid Risk Score:   Fall Risk Score:  `1  Depression screen [facility] 2/9     08/09/2024   11:46 AM 07/28/2024   10:48 AM 06/14/2024   11:30 AM 05/04/2024   10:59 AM 02/11/2024    9:57 AM 02/03/2024   10:12 AM 10/14/2023    9:19 AM  Depression screen PHQ 2/9  Decreased Interest  0 0 0 0 0 0  Down, Depressed, Hopeless 0 0 0 0 0 0 0  PHQ - 2 Score 0 0 0 0 0 0 0  Altered sleeping 0      0  Tired, decreased energy 1      0  Change in appetite 2      0  Feeling bad or failure about yourself  2      0  Trouble concentrating 0      0  Moving slowly or fidgety/restless 0      0  Suicidal thoughts 0      0  PHQ-9 Score 5      0  Difficult doing work/chores Not difficult at all      Not difficult at all    Review of Systems  Musculoskeletal:  Positive for back pain and gait problem.  Neurological:  Positive for weakness.  All other systems reviewed and are negative.      Objective:   Physical Exam  General: No acute distress HEENT: NCAT,  EOMI, oral membranes moist Cards: reg rate  Chest: normal effort Abdomen: Soft, NT, ND Skin: dry, intact Extremities: no edema Psych: pleasant and appropriate  Skin: intact.   Neuro: RLE is 3-/5 KE, tr-1 at ankle with DF/PF --> blue rocker AFO in place . LLE 4/5 grossly.  PERSISTENT  sensory loss distally in both legs per baseline upper extremities are 4+ to 5 out of 5 on the right and  4 out of 5 on the left with some pain inhibition.  Struggles more with left leg swing phase of gait.      Musculoskeletal: LB tender to palp at surgical site and above. Muscles appear taut. Fair posture.    Assessment & Plan:  1. Post Lumbar Laminectomy / Decompression: Lumbar Radiculopathy:             -gabapentin  continue at 300mg  tid and Lyrica  from [facility]             -nucynta --will assume rx--Q6 prn #60, 0 RF--may use with codeine if the codeine is prn.  Told her that increasing her activity is paramount for improvement of pain and mobility              -CSA at next visit             -do not resume tramadol              -  Cymbalta   30 mg daily.  -blue rocker AFO  for support of RLE during stance and gait -  outpt PT  RLE strengthening,  LB pain, rom, strengthening  -if therapy struggles with trigger points we can look at doing TPI's here in bathroom.  2. left BPPV-improved             -discussed being aware of safety, intake, etc             -acclimation 3. Anxiety. PCP Following and prescribing Alprazolam .               -buspar , xanax  prn per primary 4. Spasms: may continue robaxin  or baclofen , prefer baclofen  for now   -tizanidine 2mg  tid  -consider TPI's as above 5. Orthotics:             -continue AFO as above. Blue rocker is fitting well 6.  Cervical spondylosis with radiculopath at C6.                       -improved after C4-5, C5-6 ACDF 7. Right hand pain: + CTS,  -recurrent sx -discussed adaptive techniques for right wrist.   Start using right splint               -moderate  1st carpo metarcarpal OA    Twenty minutes of face to face patient care time were spent during this visit. All questions were encouraged and answered.  Follow up with me in 3 mos .

## 2024-08-17 NOTE — Patient Instructions (Signed)
 ALWAYS FEEL FREE TO CALL OUR OFFICE WITH ANY PROBLEMS OR QUESTIONS [phone])  **PLEASE NOTE** ALL MEDICATION REFILL REQUESTS (INCLUDING CONTROLLED SUBSTANCES) NEED TO BE MADE AT LEAST 7 DAYS PRIOR TO REFILL BEING DUE. ANY REFILL REQUESTS INSIDE THAT TIME FRAME MAY RESULT IN DELAYS IN RECEIVING YOUR PRESCRIPTION.     TIZANIDINE (MUSCLE RELAXANT)  TAKE 1 PILL AT BEDTIME FOR 2 DAYS, THEN TAKE 1 PILL TWICE DAILY FOR 3 DAY TAKE 1 PILL THREE X DAILY THERAFTER

## 2024-08-18 ENCOUNTER — Ambulatory Visit: Payer: Self-pay | Admitting: Physical Therapy

## 2024-08-25 ENCOUNTER — Other Ambulatory Visit: Payer: Self-pay

## 2024-08-29 ENCOUNTER — Other Ambulatory Visit (HOSPITAL_COMMUNITY): Payer: Self-pay

## 2024-08-29 ENCOUNTER — Other Ambulatory Visit: Payer: Self-pay

## 2024-08-29 DIAGNOSIS — I10 Essential (primary) hypertension: Secondary | ICD-10-CM | POA: Diagnosis not present

## 2024-08-29 DIAGNOSIS — G5601 Carpal tunnel syndrome, right upper limb: Secondary | ICD-10-CM | POA: Diagnosis not present

## 2024-08-29 MED ORDER — OXYCODONE HCL 5 MG PO TABS
5.0000 mg | ORAL_TABLET | ORAL | 0 refills | Status: DC | PRN
  Filled 2024-08-29: qty 10, 2d supply, fill #0

## 2024-08-30 ENCOUNTER — Other Ambulatory Visit (HOSPITAL_COMMUNITY): Payer: Self-pay

## 2024-08-31 ENCOUNTER — Ambulatory Visit

## 2024-09-05 ENCOUNTER — Other Ambulatory Visit: Payer: Self-pay

## 2024-09-06 ENCOUNTER — Other Ambulatory Visit (HOSPITAL_COMMUNITY): Payer: Self-pay

## 2024-09-06 NOTE — Patient Instructions (Signed)
 Visit Information  Thank you for taking time to visit with me today. Please don't hesitate to contact me if I can be of assistance to you before our next scheduled telephone appointment.  Our next appointment is by telephone on 10/03/24 at 11 am  Following is a copy of your care plan:   Goals Addressed             This Visit's Progress    VBCI RN Care Plan       Problems:  Chronic Disease Management support and education needs related to Back Pain  Goal: Over the next 3 months the Patient will demonstrate Improved adherence to prescribed treatment plan for back pain as evidenced by attending outpatient PT as ordered, rate back pain of 5 or less. 02/26/24 televisit w/ RNCM, pain remains higher than desired, at 7 out of pain scale 0-10. On 5/16 televisit w/ RNCM, pain level was much improved at and at desired level of <5, rating pain at 4 out of pain scale 0-10. On 7/7 televisit w/ RNCM, pain level rated at 5-6, has follow up with Dr. Babs planned for 7/9 to further address post surgical back pain. During 7/21 RNCM appt, patient stated post-op pain has improved to desired level of <5, patient also reported that her Orthopedic Surgeon stated that she had extensive & complex back surgery and it was not unusual for full healing to take 8-10 months. Patient continues her twice weekly PT sessions with Physical Therapist Elvie Ipoijb who patient feels has helped tremendously with her post op progress.8/19/ RNCM appt - patient is presently recovering from a severe URI for which she sought tx from her PCP (neg Covid, neg Flu) and is foregoing PT treatment while she rests and recovers. 07/28/24 RNCM FU - Patient states she is doing okay but is still recovering slowly from recent COVID infection  Interventions:   Pain Interventions: Pain assessment performed Medications reviewed Reviewed provider established plan for pain management Discussed importance of adherence to all scheduled medical  appointments Counseled on the importance of reporting any/all new or changed pain symptoms or management strategies to pain management provider Discussed use of relaxation techniques and/or diversional activities to assist with pain reduction (distraction, imagery, relaxation, massage, acupressure, TENS, heat, and cold application Reviewed with patient prescribed pharmacological and nonpharmacological pain relief strategies Screening for signs and symptoms of depression related to chronic disease state  Assessed social determinant of health barriers  Patient Self-Care Activities:  Attend all scheduled provider appointments Call pharmacy for medication refills 3-7 days in advance of running out of medications Call provider office for new concerns or questions  Take medications as prescribed    Plan:  Telephone follow up appointment with care management team member scheduled for:  10/03/2024 at 11:00am.             Care plan and visit instructions communicated with the patient verbally today. Patient agrees to receive a copy in MyChart. Active MyChart status and patient understanding of how to access instructions and care plan via MyChart confirmed with patient.     The patient has been provided with contact information for the care management team and has been advised to call with any health related questions or concerns.   Please call the care guide team at [phone] if you need to cancel or reschedule your appointment.   Please call 1-800-273-TALK (toll free, 24 hour hotline) if you are experiencing a Mental Health or Behavioral Health Crisis or need someone to  talk to.  Sterling Ucci A. Gordy RN, BA, [facility], CRRN [place]  [facility] Population [facility] RN Care Manager Direct Dial: [phone]  Fax: [phone]

## 2024-09-07 NOTE — Patient Outreach (Signed)
 Complex Care Management   Visit Note  09/05/2024  Name:  [name] MRN: [id number] DOB: [birth year]  Situation: Referral received for Complex Care Management related to Acute/Chronic Back Pain r/t Spinal Stenosis and Disc problems, Recent 08/29/24 Surgery to release Right Carpal Tunnel, and Fapp Prevention?High Risk for Falls. I obtained verbal consent from Patient.  Visit completed with Patient  on the phone  Background:   Past Medical History:  Diagnosis Date   Anxiety    Aortic atherosclerosis 04/16/2021   Atypical angina    Back pain    related to spinal stenosis and disc problem, radiates down left buttocks to leg., weakness occ.   Chest pain    a. 03/2015 Cath: nl cors; b. 03/2021 Cor CTA: Ca2+ = 0. Nl Cors.   Chronic pain syndrome    Dyspnea    hx   GERD (gastroesophageal reflux disease)    Grade I diastolic dysfunction    Headache    Hyperlipidemia    Hypertension    Lumbar post-laminectomy syndrome    LVH (left ventricular hypertrophy) 12/15/2020   a. 11/2020 Echo: EF 65-70%, no rwma, sev asymm LVH with IVSd 1.9 cm. No LVOT obs @ rest. Gr1 DD. Triv MR.   PONV (postoperative nausea and vomiting)    Pulmonary nodules    a. 03/2021 CT Chest: 3mm pulm nodules in bilat lower lobes. F/u 1 yr.   Right foot drop    Syncope    a. 11/2020 Zio: No significant arrhythmias.   Vaginal foreign object    Uses Femring     Assessment: Patient Reported Symptoms:  Cognitive Cognitive Status: No symptoms reported Cognitive/Intellectual Conditions Management [RPT]: None reported or documented in medical history or problem list   Health Maintenance Behaviors: Exercise, Social activities, Spiritual practice(s), Healthy diet, Stress management, Sleep adequate, Annual physical exam Healing Pattern: Slow Health Facilitated by: Pain control, Healthy diet, Prayer/meditation, Rest  Neurological Neurological Review of Symptoms: No symptoms reported    HEENT HEENT Symptoms  Reported: No symptoms reported      Cardiovascular Cardiovascular Symptoms Reported: No symptoms reported Does patient have uncontrolled Hypertension?: No Is patient checking Blood Pressure at home?: Yes Cardiovascular Management Strategies: Adequate rest, Coping strategies, Medication therapy, Routine screening, Diet modification Cardiovascular Self-Management Outcome: 4 (good)  Respiratory Respiratory Symptoms Reported: Dry cough Respiratory Management Strategies: Adequate rest, Coping strategies, Medication therapy Respiratory Self-Management Outcome: 4 (good)  Endocrine Endocrine Symptoms Reported: No symptoms reported Is patient diabetic?: No Endocrine Self-Management Outcome: 4 (good)  Gastrointestinal        Genitourinary      Integumentary Integumentary Symptoms Reported: No symptoms reported    Musculoskeletal Musculoskelatal Symptoms Reviewed: Back pain, Unsteady gait, Muscle pain, Other Other Musculoskeletal Symptoms: s/p surgery on 11/3 - right carpal tunnel syndrome release (2nd surgery on R wrist for same) Musculoskeletal Management Strategies: Medical device, Medication therapy, Coping strategies, Adequate rest, Exercise, Routine screening Musculoskeletal Self-Management Outcome: 4 (good)   Patient at Risk for Falls Due to: History of fall(s), Impaired balance/gait, Impaired mobility, Medication side effect, Orthopedic patient Fall risk Follow up: Falls prevention discussed, Education provided, Falls evaluation completed  Psychosocial Psychosocial Symptoms Reported: No symptoms reported Behavioral Health Self-Management Outcome: 4 (good) Major Change/Loss/Stressor/Fears (CP): Medical condition, self Techniques to Cope with Loss/Stress/Change: Diversional activities, Exercise, Spiritual practice(s), Meditation Quality of Family Relationships: supportive, involved, helpful Do you feel physically threatened by others?: No    09/07/2024    PHQ2-9 Depression Screening    Little interest or pleasure  in doing things Not at all  Feeling down, depressed, or hopeless Not at all  PHQ-2 - Total Score 0  Trouble falling or staying asleep, or sleeping too much    Feeling tired or having little energy    Poor appetite or overeating     Feeling bad about yourself - or that you are a failure or have let yourself or your family down    Trouble concentrating on things, such as reading the newspaper or watching television    Moving or speaking so slowly that other people could have noticed.  Or the opposite - being so fidgety or restless that you have been moving around a lot more than usual    Thoughts that you would be better off dead, or hurting yourself in some way    PHQ2-9 Total Score    If you checked off any problems, how difficult have these problems made it for you to do your work, take care of things at home, or get along with other people    Depression Interventions/Treatment      There were no vitals filed for this visit. Pain Scale: 0-10 Pain Score: 5  Pain Type: Acute pain Pain Location: Wrist Pain Orientation: Right Pain Descriptors / Indicators: Aching, Operative site guarding Pain Onset: Other (Comment) (ACUTE Post op pain s/p RIght carpal tunnel repair (2nd attempt on this RIGHT WRIST) Patients Stated Pain Goal: 1  Medications Reviewed Today   Medications were not reviewed in this encounter     Recommendation:   Continue Current Plan of Care  Follow Up Plan:   Telephone follow up appointment date/time:  10/03/24 at 11 am with RN Care Manager Channing LITTIE Channing A. Gordy RN, BA, [facility], CRRN [place]  [facility] RN Care Manager Direct Dial: [phone]  Fax: [phone]

## 2024-09-09 ENCOUNTER — Other Ambulatory Visit: Payer: Self-pay

## 2024-09-09 ENCOUNTER — Ambulatory Visit: Attending: Family Medicine | Admitting: Physical Therapy

## 2024-09-09 DIAGNOSIS — R2681 Unsteadiness on feet: Secondary | ICD-10-CM | POA: Insufficient documentation

## 2024-09-09 DIAGNOSIS — M5459 Other low back pain: Secondary | ICD-10-CM | POA: Diagnosis not present

## 2024-09-09 DIAGNOSIS — M6281 Muscle weakness (generalized): Secondary | ICD-10-CM | POA: Insufficient documentation

## 2024-09-09 DIAGNOSIS — R2689 Other abnormalities of gait and mobility: Secondary | ICD-10-CM | POA: Diagnosis not present

## 2024-09-09 NOTE — Therapy (Signed)
 OUTPATIENT PHYSICAL THERAPY THORACOLUMBAR EVALUATION   Patient Name: [name] MRN: [id number] DOB:11-27-1960, 63 y.o., female Today's Date: 09/09/2024  END OF SESSION:  PT End of Session - 09/09/24 0925     Visit Number 1    Date for Recertification  11/04/24    PT Start Time 0930    PT Stop Time 1010    PT Time Calculation ([name]) 40 [name]          Past Medical History:  Diagnosis Date   Anxiety    Aortic atherosclerosis 04/16/2021   Atypical angina    Back pain    related to spinal stenosis and disc problem, radiates down left buttocks to leg., weakness occ.   Chest pain    a. 03/2015 Cath: nl cors; b. 03/2021 Cor CTA: Ca2+ = 0. Nl Cors.   Chronic pain syndrome    Dyspnea    hx   GERD (gastroesophageal reflux disease)    Grade I diastolic dysfunction    Headache    Hyperlipidemia    Hypertension    Lumbar post-laminectomy syndrome    LVH (left ventricular hypertrophy) 12/15/2020   a. 11/2020 Echo: EF 65-70%, no rwma, sev asymm LVH with IVSd 1.9 cm. No LVOT obs @ rest. Gr1 DD. Triv MR.   PONV (postoperative nausea and vomiting)    Pulmonary nodules    a. 03/2021 CT Chest: 3mm pulm nodules in bilat lower lobes. F/u 1 yr.   Right foot drop    Syncope    a. 11/2020 Zio: No significant arrhythmias.   Vaginal foreign object    Uses Femring    Past Surgical History:  Procedure Laterality Date   ABDOMINAL HYSTERECTOMY     ANTERIOR CERVICAL DECOMP/DISCECTOMY FUSION N/A 01/03/2022   Procedure: Anterior Cervical Decompression Fusion - Cervical four-Cervical five - Cervical five-Cervical six;  Surgeon: Joshua Alm RAMAN, MD;  Location: [facility] OR;  Service: Neurosurgery;  Laterality: N/A;   BIOPSY  12/16/2020   Procedure: BIOPSY;  Surgeon: Aneita Gwendlyn DASEN, MD;  Location: [facility] ENDOSCOPY;  Service: Endoscopy;;   CARDIAC CATHETERIZATION N/A 04/18/2015   Procedure: Left Heart Cath and Coronary Angiography;  Surgeon: Rober Chroman, MD;  Location: [facility] INVASIVE CV LAB;   Service: Cardiovascular;  Laterality: N/A;   COLONOSCOPY     COLONOSCOPY W/ BIOPSIES AND POLYPECTOMY  2018   ESOPHAGOGASTRODUODENOSCOPY (EGD) WITH PROPOFOL  N/A 12/16/2020   Procedure: ESOPHAGOGASTRODUODENOSCOPY (EGD) WITH PROPOFOL ;  Surgeon: Aneita Gwendlyn DASEN, MD;  Location: [facility] ENDOSCOPY;  Service: Endoscopy;  Laterality: N/A;   FOOT SURGERY Bilateral    [facility] bunion,bone spur, tendon (1) -6'16, (1)-10'16   HEMATOMA EVACUATION N/A 01/05/2022   Procedure: Cervical Wound Exploration;  Surgeon: Gillie Duncans, MD;  Location: [facility] OR;  Service: Neurosurgery;  Laterality: N/A;   IR EPIDUROGRAPHY  07/21/2018   LUMBAR LAMINECTOMY/DECOMPRESSION MICRODISCECTOMY Bilateral 12/28/2015   Procedure: MICRO LUMBAR DECOMPRESSION L4 - L5 BILATERALLY;  Surgeon: Reyes Billing, MD;  Location: [facility] ORS;  Service: Orthopedics;  Laterality: Bilateral;   LUMBAR LAMINECTOMY/DECOMPRESSION MICRODISCECTOMY Bilateral 03/04/2018   Procedure: Revision of Microlumbar Decompression Bilateral Lumbar Four-Five;  Surgeon: Billing Reyes, MD;  Location: [facility] OR;  Service: Orthopedics;  Laterality: Bilateral;  90 mins   LUMBAR WOUND DEBRIDEMENT N/A 12/25/2023   Procedure: Irrigation and debridement of lumbar wound;  Surgeon: Joshua Alm Hamilton, MD;  Location[facility] OR;  Service: Neurosurgery;  Laterality: N/A;   POSTERIOR FUSION PEDICLE SCREW PLACEMENT N/A 12/07/2023   Procedure: LUMBAR FOUR-FIVE POSTERIOR LATERAL FUSION;  Surgeon: Joshua,  Alm Hamilton, MD;  Location: [facility] OR;  Service: Neurosurgery;  Laterality: N/A;   SAVORY DILATION N/A 12/16/2020   Procedure: SAVORY DILATION;  Surgeon: Aneita Gwendlyn DASEN, MD;  Location: [facility] ENDOSCOPY;  Service: Endoscopy;  Laterality: N/A;   SPINAL CORD STIMULATOR INSERTION N/A 09/28/2019   Procedure: THORACIC SPINAL CORD STIMULATOR INSERTION;  Surgeon: Burnetta Aures, MD;  Location: [facility] OR;  Service: Orthopedics;  Laterality: N/A;  2.5 hrs   SPINAL CORD STIMULATOR REMOVAL N/A 05/27/2021   Procedure:  LUMBAR SPINAL CORD STIMULATOR REMOVAL;  Surgeon: Bluford Standing, MD;  Location: [facility] ORS;  Service: Neurosurgery;  Laterality: N/A;   TUBAL LIGATION     WISDOM TOOTH EXTRACTION     WOUND EXPLORATION N/A 03/04/2018   Procedure: EXPLORATION OF LUMBAR DECOMPRESSION WOUND;  Surgeon: Duwayne Purchase, MD;  Location: [facility] OR;  Service: Orthopedics;  Laterality: N/A;   Patient Active Problem List   Diagnosis Date Noted   Vitamin D  deficiency, unspecified 04/13/2024   Seasonal allergic rhinitis due to pollen 01/29/2024   Delayed wound healing 12/25/2023   S/P lumbar fusion 12/07/2023   Pain in thumb joint with movement of right hand 08/12/2023   Localized primary osteoarthritis of carpometacarpal joint of right thumb 07/29/2023   Right foot drop 07/29/2023   Frequent falls 04/14/2023   Gastroesophageal reflux disease 04/14/2023   Right upper extremity numbness 09/29/2022   Stage 3a chronic kidney disease (HCC) 09/29/2022   Cervical spondylosis with radiculopathy 01/05/2022   S/P cervical spinal fusion 01/03/2022   Cervical disc disorder with radiculopathy of cervical region 11/27/2021   Uncontrolled pain 05/27/2021   Syncope 05/13/2021   Depression with anxiety 05/13/2021   Chronic diastolic CHF (congestive heart failure) (HCC) 05/13/2021   Chronic back pain 05/13/2021   Chest tightness 05/13/2021   Lower abdominal pain 05/13/2021   Syncope and collapse 01/01/2021   Abnormal echocardiogram 01/01/2021   BPPV (benign paroxysmal positional vertigo), left 12/26/2020   Odynophagia    Dysphagia 12/15/2020   Postural dizziness with presyncope 12/14/2020   Lumbar post-laminectomy syndrome    Chronic pain 09/28/2019   Lumbar spinal stenosis    Radicular pain    Gait disorder    Difficulty in urination 07/13/2018   Back pain 07/12/2018   Hyperlipidemia 04/16/2018   GAD (generalized anxiety disorder) 04/16/2018   AKI (acute kidney injury)    Benign essential HTN    Major depression, recurrent     Lumbar radiculopathy 03/09/2018   Paraparesis (HCC) 03/09/2018   Essential hypertension    Chronic nonintractable headache    Leukocytosis    Acute blood loss anemia    Postoperative pain    Generalized weakness    Spinal stenosis at L4-L5 level 03/04/2018   Spinal stenosis of lumbar region 12/28/2015   Chest pain 04/16/2015    PCP: Jordan, [name], MD  REFERRING PROVIDER: Joshua Alm Hamilton, MD  REFERRING DIAG: M43.16 (ICD-10-CM) - Spondylolisthesis, lumbar region  Rationale for Evaluation and Treatment: Rehabilitation  THERAPY DIAG:  Other low back pain  Muscle weakness (generalized)  Other abnormalities of gait and mobility  Unsteadiness on feet  ONSET DATE: Spinal fusion in Feb, 06/17/24 hospital admission for COVID-19  SUBJECTIVE:  SUBJECTIVE STATEMENT: Reports she was doing well in PT with Elvie at neuro s/p spinal fusion. And then pt states she was hospitalized with COVID in August. Nursing staff wanted to get her O2 with activity but she fell. Was still recovering from spine fusion at the time. Back became inflamed and had more pain. Was recommended to do dry needling but pt states she is unable to afford the out of pocket payment. D/C-ed from PT from neuro clinic at the time and had R carpal tunnel surgery. Dr recommended for her to try the ortho clinic instead. Lost all of her strength after her fall in the hospital. Normally uses forearm crutches in the house. Keeps walker close by. Was recommended to use ice vs any pain patches. Has been doing her HEP in bed from when she saw Elvie.   PERTINENT HISTORY:  R drop foot (has AFO), Feb 2025 spinal fusion (L4-5) and then I&D, R CTS  PAIN:  Are you having pain? Yes: NPRS scale: 6-7 currently, at best 4, at worst 9 Pain location: Low  back and radiates to sides and down to sacrum Pain description: Burning Aggravating factors: bending, lifting, twisting; standing long periods Relieving factors: Exercises, ice  PRECAUTIONS: Fall, Back  RED FLAGS: None   WEIGHT BEARING RESTRICTIONS: No  FALLS:  Has patient fallen in last 6 months? Yes. Number of falls 7  LIVING ENVIRONMENT: Lives with: lives with their spouse Lives in: House/apartment Stairs: No Has following equipment at home: Environmental Consultant - 4 wheeled and Crutches  OCCUPATION: Retired  PLOF: Independent  PATIENT GOALS: Improve strength/pain before her beach trip in December  NEXT MD VISIT: Dr. Joshua in December  OBJECTIVE:  Note: Objective measures were completed at Evaluation unless otherwise noted.  DIAGNOSTIC FINDINGS:  Lumbar CT 06/17/24 IMPRESSION: 1. No acute osseous abnormality in the Lumbar Spine. 2. L4-L5 decompression and fusion with no adverse hardware features. And lumbar spine degeneration elsewhere appears stable from march MRI, with multifactorial lumbar spinal stenosis at L2-L3 and L3-L4. 3.  Aortic Atherosclerosis (ICD10-I70.0).  PATIENT SURVEYS:  Modified Oswestry:  MODIFIED OSWESTRY DISABILITY SCALE  Date: 09/09/24 Score  Pain intensity 4 =  Pain medication provides me with little relief from pain.  2. Personal care (washing, dressing, etc.) 1 =  I can take care of myself normally, but it increases my pain.  3. Lifting 3 = Pain prevents me from lifting heavy weights, but I can manage light to medium weights if they are conveniently positioned  4. Walking 4 = I can only walk with crutches or a cane.  5. Sitting 2 =  Pain prevents me from sitting more than 1 hour.  6. Standing 2 =  Pain prevents me from standing more than 1 hour  7. Sleeping 1 = I can sleep well only by using pain medication.  8. Social Life 2 = Pain prevents me from participating in more energetic activities (eg. sports, dancing).  9. Traveling 2 =  My pain restricts my  travel over 2 hours.  10. Employment/ Homemaking 3 = Pain prevents me from doing anything but light duties.  Total 24/50   Interpretation of scores: Score Category Description  0-20% Minimal Disability The patient can cope with most living activities. Usually no treatment is indicated apart from advice on lifting, sitting and exercise  21-40% Moderate Disability The patient experiences more pain and difficulty with sitting, lifting and standing. Travel and social life are more difficult and they may be disabled from work.  Personal care, sexual activity and sleeping are not grossly affected, and the patient can usually be managed by conservative means  41-60% Severe Disability Pain remains the main problem in this group, but activities of daily living are affected. These patients require a detailed investigation  61-80% Crippled Back pain impinges on all aspects of the patient's life. Positive intervention is required  81-100% Bed-bound These patients are either bed-bound or exaggerating their symptoms  Bluford FORBES Zoe DELENA Karon DELENA, et al. Surgery versus conservative management of stable thoracolumbar fracture: the PRESTO feasibility RCT. [place] ([place]): [employer]; 2021 Nov. [facility] Technology Assessment, No. 25.62.) Appendix 3, Oswestry Disability Index category descriptors. Available from: Findjewelers.cz  Minimally Clinically Important Difference (MCID) = 12.8%  COGNITION: Overall cognitive status: Within functional limits for tasks assessed     SENSATION: WFL  MUSCLE LENGTH: Hamstrings: Right ~45 deg; Left ~45 deg pulls in back  POSTURE: No Significant postural limitations  PALPATION: Highly tender to gentle palpation bilat lumbar paraspinals, QL, upper glutes, R>L glute max  LUMBAR ROM: Did not assess  AROM eval  Flexion   Extension   Right lateral flexion   Left lateral flexion   Right rotation   Left rotation    (Blank rows =  not tested)  LOWER EXTREMITY ROM: limited R>L hip ER, IR and abd in supine. Knee ROM assessment limited due to pt's R AFO  LOWER EXTREMITY MMT:     Active  Right eval Left eval  Hip flexion 3- 3+  Hip extension 3- 3-  Hip abduction 3 3+  Hip adduction    Hip internal rotation    Hip external rotation    Knee flexion 3+ 3+  Knee extension 3- p! In back 3  Ankle dorsiflexion    Ankle plantarflexion    Ankle inversion    Ankle eversion     (Blank rows = not tested)   LUMBAR SPECIAL TESTS:  Straight leg raise test: Positive and FABER test: Positive  FUNCTIONAL TESTS:  5 times sit to stand: TBA  GAIT: Distance walked: Into clinic Assistive device utilized: Rollator Level of assistance: SBA Comments: R foot in AFO, R LE slightly externally rotated, reciprocal pattern  TREATMENT DATE:  09/09/24                                                                                                                              Modalities: E-stim IFC lumbar paraspinals with ice under back x 10 [name], intensity to pt tolerance   PATIENT EDUCATION:  Education details: Exam findings, POC, TENs Person educated: Patient Education method: Explanation, Demonstration, and Handouts Education comprehension: verbalized understanding, returned demonstration, and needs further education  HOME EXERCISE PROGRAM: To be initiated  ASSESSMENT:  CLINICAL IMPRESSION: Patient is a 63 y.o. F who was seen today for physical therapy evaluation and treatment for low back pain. PMH is significant for L4-5 spinal fusion in Feb 2025, chronic R foot drop (with  AFO), recent carpal tunnel surgery, COVID with recent hospitalization, and history of multiple falls. Assessment is significant for gross bilat LE weakness, increased lumbar paraspinal and glute spasm, reduced hip and hamstring flexibility affecting tolerance to prolonged unsupported sitting and standing tasks. Pain limits/inhibits pt's movements. Has  deferred dry needling due to financial cost. States Dr. Joshua would like her to be seen here at the ortho clinic to see if we can help with this flare up. Pt will benefit from PT to address these issues and improve home/community safety.   OBJECTIVE IMPAIRMENTS: Abnormal gait, decreased activity tolerance, decreased balance, decreased coordination, decreased endurance, decreased mobility, difficulty walking, decreased ROM, decreased strength, increased edema, increased fascial restrictions, increased muscle spasms, impaired flexibility, improper body mechanics, postural dysfunction, and pain.   ACTIVITY LIMITATIONS: carrying, lifting, bending, sitting, standing, squatting, stairs, transfers, bed mobility, hygiene/grooming, and locomotion level  PARTICIPATION LIMITATIONS: meal prep, cleaning, laundry, driving, shopping, and community activity  PERSONAL FACTORS: Age, Fitness, Past/current experiences, and Time since onset of injury/illness/exacerbation are also affecting patient's functional outcome.   REHAB POTENTIAL: Good  CLINICAL DECISION MAKING: Unstable/unpredictable  EVALUATION COMPLEXITY: High   GOALS: Goals reviewed with patient? Yes  SHORT TERM GOALS: Target date: 10/07/2024   Pt will be ind with initial HEP Baseline: Goal status: INITIAL  2.  Pt will be able to perform 5x STS in </=30 sec Baseline: From prior PT note 42.97 sec 06/28/24 Goal status: INITIAL   LONG TERM GOALS: Target date: 11/04/2024   Pt will be ind with management and progression of HEP Baseline:  Goal status: INITIAL  2.  Pt will report improved pain by >/=50% Baseline:  Goal status: INITIAL  3.  Pt will have improved TUG to </=30 sec with rollator Baseline: 36.63 sec 08/02/24 Goal status: INITIAL  4.  Pt will have improved 5x STS to </=25 sec to demo increased functional LE Baseline:  Goal status: INITIAL  5.  Pt will have improved modified Oswestry to </=12/50 to demo MCID Baseline:  24.8 Goal status: INITIAL    PLAN:  PT FREQUENCY: 2x/week  PT DURATION: 8 weeks  PLANNED INTERVENTIONS: 97164- PT Re-evaluation, 97110-Therapeutic exercises, 97530- Therapeutic activity, 97112- Neuromuscular re-education, 97535- Self Care, 02859- Manual therapy, [id number]- Gait training, [phone]- Aquatic Therapy, [phone]- Electrical stimulation (unattended), [id number]- Ultrasound, [id number]- Ionotophoresis 4mg /ml Dexamethasone , 79439 (1-2 muscles), 20561 (3+ muscles)- Dry Needling, Patient/Family education, Balance training, Taping, Joint mobilization, Cryotherapy, and Moist heat.  PLAN FOR NEXT SESSION: Initiate core strengthening and general LE conditioning/mobility. Decrease back spasm/pain with manual/modalities as indicated.    [name], PT, DPT 09/09/2024, 9:34 AM

## 2024-09-15 ENCOUNTER — Ambulatory Visit (INDEPENDENT_AMBULATORY_CARE_PROVIDER_SITE_OTHER)

## 2024-09-15 ENCOUNTER — Other Ambulatory Visit: Payer: Self-pay

## 2024-09-15 ENCOUNTER — Other Ambulatory Visit (HOSPITAL_COMMUNITY): Payer: Self-pay

## 2024-09-15 VITALS — BP 131/79 | HR 81 | Temp 97.5°F | Ht 65.0 in | Wt 163.0 lb

## 2024-09-15 DIAGNOSIS — R053 Chronic cough: Secondary | ICD-10-CM

## 2024-09-15 DIAGNOSIS — Z981 Arthrodesis status: Secondary | ICD-10-CM | POA: Diagnosis not present

## 2024-09-15 DIAGNOSIS — Z9109 Other allergy status, other than to drugs and biological substances: Secondary | ICD-10-CM | POA: Diagnosis not present

## 2024-09-15 DIAGNOSIS — R0602 Shortness of breath: Secondary | ICD-10-CM | POA: Diagnosis not present

## 2024-09-15 DIAGNOSIS — F4320 Adjustment disorder, unspecified: Secondary | ICD-10-CM

## 2024-09-15 LAB — CBC WITH DIFFERENTIAL/PLATELET
Basophils Absolute: 0 K/uL (ref 0.0–0.1)
Basophils Relative: 1 % (ref 0.0–3.0)
Eosinophils Absolute: 0 K/uL (ref 0.0–0.7)
Eosinophils Relative: 1.3 % (ref 0.0–5.0)
HCT: 38.2 % (ref 36.0–46.0)
Hemoglobin: 12.5 g/dL (ref 12.0–15.0)
Lymphocytes Relative: 29.7 % (ref 12.0–46.0)
Lymphs Abs: 1.1 K/uL (ref 0.7–4.0)
MCHC: 32.7 g/dL (ref 30.0–36.0)
MCV: 90.4 fl (ref 78.0–100.0)
Monocytes Absolute: 0.3 K/uL (ref 0.1–1.0)
Monocytes Relative: 9.5 % (ref 3.0–12.0)
Neutro Abs: 2.1 K/uL (ref 1.4–7.7)
Neutrophils Relative %: 58.5 % (ref 43.0–77.0)
Platelets: 284 K/uL (ref 150.0–400.0)
RBC: 4.23 Mil/uL (ref 3.87–5.11)
RDW: 13.5 % (ref 11.5–15.5)
WBC: 3.6 K/uL — ABNORMAL LOW (ref 4.0–10.5)

## 2024-09-15 LAB — BRAIN NATRIURETIC PEPTIDE: Pro B Natriuretic peptide (BNP): 8 pg/mL (ref 0.0–100.0)

## 2024-09-15 MED ORDER — GUAIFENESIN ER 600 MG PO TB12
1200.0000 mg | ORAL_TABLET | Freq: Two times a day (BID) | ORAL | 0 refills | Status: DC | PRN
  Filled 2024-09-15: qty 30, 8d supply, fill #0

## 2024-09-15 MED ORDER — ALBUTEROL SULFATE HFA 108 (90 BASE) MCG/ACT IN AERS
2.0000 | INHALATION_SPRAY | Freq: Four times a day (QID) | RESPIRATORY_TRACT | 6 refills | Status: AC | PRN
  Filled 2024-09-15: qty 6.7, 25d supply, fill #0

## 2024-09-15 NOTE — Patient Instructions (Addendum)
 It was a pleasure to see you today. Please have your labs drawn in our clinic today. Chest xray today Your pulmonary function test will be scheduled at check out Take albuterol  as needed and before bedtime

## 2024-09-15 NOTE — Progress Notes (Signed)
 New Patient Pulmonology Office Visit   Subjective:  Patient ID: [name], female    DOB: 04-30-1961  MRN: [id number]  Referred by: Jordan, [name], MD  CC:  Chief Complaint  Patient presents with   Consult    History of Covid    Cough    Dry cough. Started 2 months ago,   Shortness of Breath    Due to the cough    HPI [name] is a 63 y.o. female who is here to establish for chronic cough  Discussed the use of AI scribe software for clinical note transcription with the patient, who gave verbal consent to proceed.  History of Present Illness [name] is a 63 year old female who presents with a persistent cough and shortness of breath. She was referred by Dr. Jordan for evaluation of her persistent cough and shortness of breath.  She has been experiencing a persistent cough and shortness of breath for the past few months following a COVID-19 infection. Initially, she thought she had the flu, but after experiencing difficulty breathing one night, her husband took her to the emergency room where she was diagnosed with COVID-19 and admitted to [facility]. Of note, her xray from 05/2024 when she was admitted for covid is clear. Since then, the cough has persisted, and she experiences shortness of breath, particularly when climbing stairs or lying flat. The cough can become uncontrollable, leading to headaches.  She was prescribed cough medicine, which helped with the cough but caused undesirable side effects. She has a history of using albuterol  inhalers but is unsure if she currently has one at home. She also uses Flonase nasal spray and eye drops for allergies, which she finds more effective than over-the-counter options.  Her past medical history includes high blood pressure, which runs in her family, and a spine fusion surgery earlier this year. She has experienced multiple falls and wears a brace and uses a cane due to drop foot.  She is on disability and lives with her husband.  The patient reports a family history of lung cancer, with her father, brother, and sister having died from the disease. Her son has asthma and type 2 diabetes, which he inherited from his father's side of the family. She has never smoked and denies any personal history of asthma or acid reflux disease.  She reports a decreased appetite since her COVID-19 infection and experiences coughing when eating solid foods, though she denies any known swallowing difficulties. She reports environmental allergies.  She is grieving the death of her breast friend, who reportedly had cough and was well until few days prior to her passing at [facility]. She is concerned that all her work up for cough had been normal until then.    ROS Review of symptoms negative except mentioned above   Allergies: Cephalosporins; Penicillins; Anesthetics, amide; Betadine [povidone iodine]; Latex; Peach [prunus persica]; Xyzal  [levocetirizine]; and Claritin [loratadine]  Current Outpatient Medications:    acetaminophen  (TYLENOL ) 500 MG tablet, Take 500 mg by mouth every 6 (six) hours as needed for headache., Disp: , Rfl:    albuterol  (VENTOLIN  HFA) 108 (90 Base) MCG/ACT inhaler, Inhale 2 puffs into the lungs every 6 (six) hours as needed for wheezing or shortness of breath., Disp: 8 g, Rfl: 6   ALPRAZolam  (XANAX ) 0.5 MG tablet, Take 1/2 - 1 tablet by mouth at bedtime as needed for anxiety., Disp: 30 tablet, Rfl: 3   amitriptyline  (ELAVIL ) 25 MG  tablet, Take 1 tablet (25 mg total) by mouth daily., Disp: 90 tablet, Rfl: 2   amLODipine  (NORVASC ) 5 MG tablet, Take 1 tablet (5 mg total) by mouth daily., Disp: 90 tablet, Rfl: 2   atorvastatin  (LIPITOR) 20 MG tablet, Take 1 tablet (20 mg total) by mouth daily., Disp: 90 tablet, Rfl: 2   baclofen  (LIORESAL ) 10 MG tablet, Take 1 tablet (10 mg total) by mouth 3 (three) times daily as needed., Disp: 60 each, Rfl: 3   betamethasone   dipropionate 0.05 % cream, Apply as directed to itchy areas of skin on back only, avoiding normal skin, as needed., Disp: 45 g, Rfl: 1   carvedilol  (COREG ) 25 MG tablet, Take 1 tablet (25 mg total) by mouth 2 (two) times daily with a meal., Disp: 180 tablet, Rfl: 3   cholecalciferol  (VITAMIN D3) 25 MCG (1000 UNIT) tablet, Take 1,000 Units by mouth daily., Disp: , Rfl:    diphenhydrAMINE  (BENADRYL ) 25 MG tablet, Take 25 mg by mouth daily as needed for itching., Disp: , Rfl:    DULoxetine  (CYMBALTA ) 30 MG capsule, Take 1 capsule (30 mg total) by mouth daily., Disp: , Rfl:    Estradiol  Acetate (FEMRING ) 0.05 MG/24HR RING, USE ONE RING VAGINALLY EVERY 3 MONTHS AS DIRECTED, Disp: 1 each, Rfl: 4   gabapentin  (NEURONTIN ) 600 MG tablet, Take 0.5 tablets (300 mg total) by mouth 3 (three) times daily., Disp: , Rfl:    hydrALAZINE  (APRESOLINE ) 50 MG tablet, Take 1 tablet (50 mg total) by mouth 2 (two) times daily., Disp: , Rfl:    meclizine  (ANTIVERT ) 25 MG tablet, Take 1 tablet (25 mg total) by mouth 3 (three) times daily as needed for dizziness., Disp: 30 tablet, Rfl: 0   methocarbamol  (ROBAXIN ) 500 MG tablet, Take 1 tablet (500 mg total) by mouth 3 (three) times daily as needed for muscle spasms., Disp: 30 tablet, Rfl: 1   mometasone  (NASONEX ) 50 MCG/ACT nasal spray, Place 2 sprays into the nose daily., Disp: 17 g, Rfl: 3   tapentadol  (NUCYNTA ) 50 MG tablet, Take 1 tablet (50 mg total) by mouth every 6 (six) hours as needed., Disp: , Rfl:    tiZANidine  (ZANAFLEX ) 2 MG tablet, Take 1 tablet (2 mg total) by mouth 3 (three) times daily., Disp: 90 tablet, Rfl: 3   docusate sodium  (COLACE) 100 MG capsule, Take 100 mg by mouth 2 (two) times daily. (Patient not taking: Reported on 09/15/2024), Disp: , Rfl:    gabapentin  (NEURONTIN ) 600 MG tablet, Take 1 tablet (600 mg total) by mouth 3 (three) times daily. (Patient not taking: Reported on 09/15/2024), Disp: 90 tablet, Rfl: 3   guaiFENesin  (MUCINEX ) 600 MG 12 hr  tablet, Take 2 tablets (1,200 mg total) by mouth 2 (two) times daily as needed., Disp: 30 tablet, Rfl: 0   methocarbamol  (ROBAXIN ) 500 MG tablet, Take 1 tablet (500 mg total) by mouth 3 (three) times daily as needed for muscle spasm. (Patient not taking: Reported on 09/15/2024), Disp: 30 tablet, Rfl: 6 Past Medical History:  Diagnosis Date   Anxiety    Aortic atherosclerosis 04/16/2021   Atypical angina    Back pain    related to spinal stenosis and disc problem, radiates down left buttocks to leg., weakness occ.   Chest pain    a. 03/2015 Cath: nl cors; b. 03/2021 Cor CTA: Ca2+ = 0. Nl Cors.   Chronic pain syndrome    Dyspnea    hx   GERD (gastroesophageal reflux disease)  Grade I diastolic dysfunction    Headache    Hyperlipidemia    Hypertension    Lumbar post-laminectomy syndrome    LVH (left ventricular hypertrophy) 12/15/2020   a. 11/2020 Echo: EF 65-70%, no rwma, sev asymm LVH with IVSd 1.9 cm. No LVOT obs @ rest. Gr1 DD. Triv MR.   PONV (postoperative nausea and vomiting)    Pulmonary nodules    a. 03/2021 CT Chest: 3mm pulm nodules in bilat lower lobes. F/u 1 yr.   Right foot drop    Syncope    a. 11/2020 Zio: No significant arrhythmias.   Vaginal foreign object    Uses Femring    Past Surgical History:  Procedure Laterality Date   ABDOMINAL HYSTERECTOMY     ANTERIOR CERVICAL DECOMP/DISCECTOMY FUSION N/A 01/03/2022   Procedure: Anterior Cervical Decompression Fusion - Cervical four-Cervical five - Cervical five-Cervical six;  Surgeon: Joshua Alm RAMAN, MD;  [facility] OR;  Service: Neurosurgery;  Laterality: N/A;   BIOPSY  12/16/2020   Procedure: BIOPSY;  Surgeon: Aneita Gwendlyn DASEN, MD;  Location: [facility] ENDOSCOPY;  Service: Endoscopy;;   CARDIAC CATHETERIZATION N/A 04/18/2015   Procedure: Left Heart Cath and Coronary Angiography;  Surgeon: Rober Chroman, MD;  Location: [facility] INVASIVE CV LAB;  Service: Cardiovascular;  Laterality: N/A;   COLONOSCOPY     COLONOSCOPY W/ BIOPSIES  AND POLYPECTOMY  2018   ESOPHAGOGASTRODUODENOSCOPY (EGD) WITH PROPOFOL  N/A 12/16/2020   Procedure: ESOPHAGOGASTRODUODENOSCOPY (EGD) WITH PROPOFOL ;  Surgeon: Aneita Gwendlyn DASEN, MD;  Location[facility] ENDOSCOPY;  Service: Endoscopy;  Laterality: N/A;   FOOT SURGERY Bilateral    [facility] bunion,bone spur, tendon (1) -6'16, (1)-10'16   HEMATOMA EVACUATION N/A 01/05/2022   Procedure: Cervical Wound Exploration;  Surgeon: Gillie Duncans, MD;  Location: [facility] OR;  Service: Neurosurgery;  Laterality: N/A;   IR EPIDUROGRAPHY  07/21/2018   LUMBAR LAMINECTOMY/DECOMPRESSION MICRODISCECTOMY Bilateral 12/28/2015   Procedure: MICRO LUMBAR DECOMPRESSION L4 - L5 BILATERALLY;  Surgeon: Reyes Billing, MD;  Location: [facility] ORS;  Service: Orthopedics;  Laterality: Bilateral;   LUMBAR LAMINECTOMY/DECOMPRESSION MICRODISCECTOMY Bilateral 03/04/2018   Procedure: Revision of Microlumbar Decompression Bilateral Lumbar Four-Five;  Surgeon: Billing Reyes, MD;  Location: [facility] OR;  Service: Orthopedics;  Laterality: Bilateral;  90 mins   LUMBAR WOUND DEBRIDEMENT N/A 12/25/2023   Procedure: Irrigation and debridement of lumbar wound;  Surgeon: Joshua Alm Hamilton, MD;  Location: [facility] OR;  Service: Neurosurgery;  Laterality: N/A;   POSTERIOR FUSION PEDICLE SCREW PLACEMENT N/A 12/07/2023   Procedure: LUMBAR FOUR-FIVE POSTERIOR LATERAL FUSION;  Surgeon: Joshua Alm Hamilton, MD;  Location: [facility] OR;  Service: Neurosurgery;  Laterality: N/A;   SAVORY DILATION N/A 12/16/2020   Procedure: SAVORY DILATION;  Surgeon: Aneita Gwendlyn DASEN, MD;  Location: [facility] ENDOSCOPY;  Service: Endoscopy;  Laterality: N/A;   SPINAL CORD STIMULATOR INSERTION N/A 09/28/2019   Procedure: THORACIC SPINAL CORD STIMULATOR INSERTION;  Surgeon: Burnetta Aures, MD;  Location: [facility] OR;  Service: Orthopedics;  Laterality: N/A;  2.5 hrs   SPINAL CORD STIMULATOR REMOVAL N/A 05/27/2021   Procedure: LUMBAR SPINAL CORD STIMULATOR REMOVAL;  Surgeon: Bluford Standing, MD;  Location: [facility] ORS;   Service: Neurosurgery;  Laterality: N/A;   TUBAL LIGATION     WISDOM TOOTH EXTRACTION     WOUND EXPLORATION N/A 03/04/2018   Procedure: EXPLORATION OF LUMBAR DECOMPRESSION WOUND;  Surgeon: Billing Reyes, MD;  Location: [facility] OR;  Service: Orthopedics;  Laterality: N/A;   Family History  Problem Relation Age of Onset   Heart attack Mother  Lung cancer Father    Cancer Father    Pancreatic cancer Sister    Breast cancer Sister 20   Multiple myeloma Sister    Breast cancer Sister        diagnosed in her 57's   Heart attack Sister    Throat cancer Brother    Lung cancer Brother    Stomach cancer Cousin    Colon cancer Neg Hx    Colon polyps Neg Hx    Esophageal cancer Neg Hx    Rectal cancer Neg Hx    Social History   Socioeconomic History   Marital status: Married    Spouse name: Rayne Sprang   Number of children: 1   Years of education: Not on file   Highest education level: Not on file  Occupational History   Not on file  Tobacco Use   Smoking status: Never   Smokeless tobacco: Never  Vaping Use   Vaping status: Never Used  Substance and Sexual Activity   Alcohol use: Never   Drug use: Never   Sexual activity: Yes    Partners: Male    Birth control/protection: Post-menopausal, Surgical    Comment: Hysterectomy  Other Topics Concern   Not on file  Social History Narrative   Not on file   Social Drivers of Health   Financial Resource Strain: Low Risk  (05/02/2024)   Overall Financial Resource Strain (CARDIA)    Difficulty of Paying Living Expenses: Not very hard  Food Insecurity: No Food Insecurity (06/17/2024)   Hunger Vital Sign    Worried About Running Out of Food in the Last Year: Never true    Ran Out of Food in the Last Year: Never true  Transportation Needs: No Transportation Needs (06/17/2024)   PRAPARE - Transportation    Lack of Transportation (Medical): No    Lack of Transportation (Non-Medical): No  Physical Activity: Insufficiently Active  (05/02/2024)   Exercise Vital Sign    Days of Exercise per Week: 2 days    Minutes of Exercise per Session: 30 min  Stress: No Stress Concern Present (10/02/2023)   [employer] of Occupational Health - Occupational Stress Questionnaire    Feeling of Stress : Not at all  Social Connections: Socially Integrated (06/14/2024)   Social Connection and Isolation Panel    Frequency of Communication with Friends and Family: More than three times a week    Frequency of Social Gatherings with Friends and Family: More than three times a week    Attends Religious Services: More than 4 times per year    Active Member of [employer] or Organizations: Yes    Attends Engineer, Structural: More than 4 times per year    Marital Status: Married  Catering Manager Violence: Not At Risk (06/17/2024)   Humiliation, Afraid, Rape, and Kick questionnaire    Fear of Current or Ex-Partner: No    Emotionally Abused: No    Physically Abused: No    Sexually Abused: No         Objective:  BP 131/79   Pulse 81   Temp (!) 97.5 F (36.4 C) (Oral)   Ht 5' 5 (1.651 m)   Wt 163 lb (73.9 kg)   SpO2 98%   BMI 27.12 kg/m    Physical Exam Constitutional:      General: She is not in acute distress.    Appearance: Normal appearance.  HENT:     Mouth/Throat:     Mouth: Mucous membranes  are moist.  Cardiovascular:     Rate and Rhythm: Normal rate.  Pulmonary:     Effort: No respiratory distress.     Breath sounds: No wheezing or rales.  Musculoskeletal:     Right lower leg: No edema.     Left lower leg: No edema.  Skin:    General: Skin is warm.  Neurological:     Mental Status: She is alert and oriented to person, place, and time.  Psychiatric:        Mood and Affect: Mood normal.     Diagnostic Review:    Pft     No data to display               Results LABS Creatinine: Elevated (05/2024)  RADIOLOGY Chest X-ray: No pneumonia (05/2024)  05/2024 Eosinophils 3%, absolute 300      Assessment & Plan:   Assessment & Plan Chronic cough Pr reports post viral cough since Covid in Aug 2025. Normal Xray at that time Cough worse when she lays down I explained to the patient in detail that chronic cough could be a manifestation of chronic sinusitis with post nasal drip, cough variant asthma, Nonasthmatic eosinophilic bronchitis, gastroesophageal reflux, laryngopharyngeal reflux, parenchymal lung disease like fibrosis, pulmonary edema from congestive heart failure, habitual cough or lung cancer.  Will send the following work up No CHF like features on exam. Offered PPI but pt wants to hold off on it She agrees to use as needed albuterol . Advised her to use it before activities that generally lead to cough or shortness of breath  Orders:   CBC with Differential   IgE; Future   DG Chest 2 View; Future   B Nat Peptide; Future   guaiFENesin  (MUCINEX ) 600 MG 12 hr tablet; Take 2 tablets (1,200 mg total) by mouth 2 (two) times daily as needed.   albuterol  (VENTOLIN  HFA) 108 (90 Base) MCG/ACT inhaler; Inhale 2 puffs into the lungs every 6 (six) hours as needed for wheezing or shortness of breath.   Pulmonary function test; Future  SOB (shortness of breath) Likely related to cough Will r/o obstructive airways disease Weight likely contributing as well Orders:   CBC with Differential   IgE; Future   DG Chest 2 View; Future   B Nat Peptide; Future  Environmental allergies Pt reports environmental allergies and takes as needed nasal steroids spray Her son has asthma which she believed he got from his father's side Orders:   CBC with Differential   IgE; Future   B Nat Peptide; Future  Grief reaction She is grieving the death of her breast friend, who reportedly had cough and was well until few days prior to her passing at [facility]. She is concerned that all her work up for cough had been normal until then. Counseling/moral support provided       Thank you for the  opportunity to take part in the care of [place]   Return in about 4 weeks (around 10/13/2024).   I spent 60 minutes in the care of Collene Massimino today including reviewing labs (as mentioned in results), reviewing studies (as mentioned in results), face to face time discussing treatment options (discussed treatment of asthma, reactive airways, GERD, sinusitis), reviewing records from hospital admission in 05/2024 (referral notes), counseling, and documenting in the encounter.    Victoriano Campion Pleas, MD [facility] Office: [phone]

## 2024-09-16 LAB — IGE: IgE (Immunoglobulin E), Serum: 114 kU/L (ref <=114)

## 2024-09-20 ENCOUNTER — Ambulatory Visit

## 2024-09-20 DIAGNOSIS — R2681 Unsteadiness on feet: Secondary | ICD-10-CM

## 2024-09-20 DIAGNOSIS — R2689 Other abnormalities of gait and mobility: Secondary | ICD-10-CM

## 2024-09-20 DIAGNOSIS — M5459 Other low back pain: Secondary | ICD-10-CM | POA: Diagnosis not present

## 2024-09-20 DIAGNOSIS — M6281 Muscle weakness (generalized): Secondary | ICD-10-CM

## 2024-09-20 NOTE — Therapy (Signed)
 OUTPATIENT PHYSICAL THERAPY THORACOLUMBAR EVALUATION   Patient Name: [name] MRN: [id number] DOB:1961/06/03, 63 y.o., female Today's Date: 09/20/2024  END OF SESSION:    Past Medical History:  Diagnosis Date   Anxiety    Aortic atherosclerosis 04/16/2021   Atypical angina    Back pain    related to spinal stenosis and disc problem, radiates down left buttocks to leg., weakness occ.   Chest pain    a. 03/2015 Cath: nl cors; b. 03/2021 Cor CTA: Ca2+ = 0. Nl Cors.   Chronic pain syndrome    Dyspnea    hx   GERD (gastroesophageal reflux disease)    Grade I diastolic dysfunction    Headache    Hyperlipidemia    Hypertension    Lumbar post-laminectomy syndrome    LVH (left ventricular hypertrophy) 12/15/2020   a. 11/2020 Echo: EF 65-70%, no rwma, sev asymm LVH with IVSd 1.9 cm. No LVOT obs @ rest. Gr1 DD. Triv MR.   PONV (postoperative nausea and vomiting)    Pulmonary nodules    a. 03/2021 CT Chest: 3mm pulm nodules in bilat lower lobes. F/u 1 yr.   Right foot drop    Syncope    a. 11/2020 Zio: No significant arrhythmias.   Vaginal foreign object    Uses Femring    Past Surgical History:  Procedure Laterality Date   ABDOMINAL HYSTERECTOMY     ANTERIOR CERVICAL DECOMP/DISCECTOMY FUSION N/A 01/03/2022   Procedure: Anterior Cervical Decompression Fusion - Cervical four-Cervical five - Cervical five-Cervical six;  Surgeon: Joshua Alm RAMAN, MD;  Location: [facility] OR;  Service: Neurosurgery;  Laterality: N/A;   BIOPSY  12/16/2020   Procedure: BIOPSY;  Surgeon: Aneita Gwendlyn DASEN, MD;  Location: [facility] ENDOSCOPY;  Service: Endoscopy;;   CARDIAC CATHETERIZATION N/A 04/18/2015   Procedure: Left Heart Cath and Coronary Angiography;  Surgeon: Rober Chroman, MD;  Location: [facility] INVASIVE CV LAB;  Service: Cardiovascular;  Laterality: N/A;   COLONOSCOPY     COLONOSCOPY W/ BIOPSIES AND POLYPECTOMY  2018   ESOPHAGOGASTRODUODENOSCOPY (EGD) WITH PROPOFOL  N/A 12/16/2020   Procedure:  ESOPHAGOGASTRODUODENOSCOPY (EGD) WITH PROPOFOL ;  Surgeon: Aneita Gwendlyn DASEN, MD;  Location: [facility] ENDOSCOPY;  Service: Endoscopy;  Laterality: N/A;   FOOT SURGERY Bilateral    [facility] bunion,bone spur, tendon (1) -6'16, (1)-10'16   HEMATOMA EVACUATION N/A 01/05/2022   Procedure: Cervical Wound Exploration;  Surgeon: Gillie Duncans, MD;  Location: [facility] OR;  Service: Neurosurgery;  Laterality: N/A;   IR EPIDUROGRAPHY  07/21/2018   LUMBAR LAMINECTOMY/DECOMPRESSION MICRODISCECTOMY Bilateral 12/28/2015   Procedure: MICRO LUMBAR DECOMPRESSION L4 - L5 BILATERALLY;  Surgeon: Reyes Billing, MD;  Location: [facility] ORS;  Service: Orthopedics;  Laterality: Bilateral;   LUMBAR LAMINECTOMY/DECOMPRESSION MICRODISCECTOMY Bilateral 03/04/2018   Procedure: Revision of Microlumbar Decompression Bilateral Lumbar Four-Five;  Surgeon: Billing Reyes, MD;  Location: [facility] OR;  Service: Orthopedics;  Laterality: Bilateral;  90 mins   LUMBAR WOUND DEBRIDEMENT N/A 12/25/2023   Procedure: Irrigation and debridement of lumbar wound;  Surgeon: Joshua Alm Hamilton, MD;  Location: [facility] OR;  Service: Neurosurgery;  Laterality: N/A;   POSTERIOR FUSION PEDICLE SCREW PLACEMENT N/A 12/07/2023   Procedure: LUMBAR FOUR-FIVE POSTERIOR LATERAL FUSION;  Surgeon: Joshua Alm Hamilton, MD;  Location: [facility] OR;  Service: Neurosurgery;  Laterality: N/A;   SAVORY DILATION N/A 12/16/2020   Procedure: SAVORY DILATION;  Surgeon: Aneita Gwendlyn DASEN, MD;  Location: [facility] ENDOSCOPY;  Service: Endoscopy;  Laterality: N/A;   SPINAL CORD STIMULATOR INSERTION N/A 09/28/2019   Procedure: THORACIC  SPINAL CORD STIMULATOR INSERTION;  Surgeon: Burnetta Aures, MD;  Location: [facility] OR;  Service: Orthopedics;  Laterality: N/A;  2.5 hrs   SPINAL CORD STIMULATOR REMOVAL N/A 05/27/2021   Procedure: LUMBAR SPINAL CORD STIMULATOR REMOVAL;  Surgeon: Bluford Standing, MD;  Location: [facility] ORS;  Service: Neurosurgery;  Laterality: N/A;   TUBAL LIGATION     WISDOM TOOTH EXTRACTION     WOUND  EXPLORATION N/A 03/04/2018   Procedure: EXPLORATION OF LUMBAR DECOMPRESSION WOUND;  Surgeon: Duwayne Purchase, MD;  Location: [facility] OR;  Service: Orthopedics;  Laterality: N/A;   Patient Active Problem List   Diagnosis Date Noted   Vitamin D  deficiency, unspecified 04/13/2024   Seasonal allergic rhinitis due to pollen 01/29/2024   Delayed wound healing 12/25/2023   S/P lumbar fusion 12/07/2023   Pain in thumb joint with movement of right hand 08/12/2023   Localized primary osteoarthritis of carpometacarpal joint of right thumb 07/29/2023   Right foot drop 07/29/2023   Frequent falls 04/14/2023   Gastroesophageal reflux disease 04/14/2023   Right upper extremity numbness 09/29/2022   Stage 3a chronic kidney disease (HCC) 09/29/2022   Cervical spondylosis with radiculopathy 01/05/2022   S/P cervical spinal fusion 01/03/2022   Cervical disc disorder with radiculopathy of cervical region 11/27/2021   Uncontrolled pain 05/27/2021   Syncope 05/13/2021   Depression with anxiety 05/13/2021   Chronic diastolic CHF (congestive heart failure) (HCC) 05/13/2021   Chronic back pain 05/13/2021   Chest tightness 05/13/2021   Lower abdominal pain 05/13/2021   Syncope and collapse 01/01/2021   Abnormal echocardiogram 01/01/2021   BPPV (benign paroxysmal positional vertigo), left 12/26/2020   Odynophagia    Dysphagia 12/15/2020   Postural dizziness with presyncope 12/14/2020   Lumbar post-laminectomy syndrome    Chronic pain 09/28/2019   Lumbar spinal stenosis    Radicular pain    Gait disorder    Difficulty in urination 07/13/2018   Back pain 07/12/2018   Hyperlipidemia 04/16/2018   GAD (generalized anxiety disorder) 04/16/2018   AKI (acute kidney injury)    Benign essential HTN    Major depression, recurrent    Lumbar radiculopathy 03/09/2018   Paraparesis (HCC) 03/09/2018   Essential hypertension    Chronic nonintractable headache    Leukocytosis    Acute blood loss anemia     Postoperative pain    Generalized weakness    Spinal stenosis at L4-L5 level 03/04/2018   Spinal stenosis of lumbar region 12/28/2015   Chest pain 04/16/2015    PCP: Jordan, [name], MD  REFERRING PROVIDER: Joshua Alm Hamilton, MD  REFERRING DIAG: M43.16 (ICD-10-CM) - Spondylolisthesis, lumbar region  Rationale for Evaluation and Treatment: Rehabilitation  THERAPY DIAG:  No diagnosis found.  ONSET DATE: Spinal fusion in Feb, 06/17/24 hospital admission for COVID-19  SUBJECTIVE:  SUBJECTIVE STATEMENT: Patient reports to ***    Reports she was doing well in PT with Elvie at neuro s/p spinal fusion. And then pt states she was hospitalized with COVID in August. Nursing staff wanted to get her O2 with activity but she fell. Was still recovering from spine fusion at the time. Back became inflamed and had more pain. Was recommended to do dry needling but pt states she is unable to afford the out of pocket payment. D/C-ed from PT from neuro clinic at the time and had R carpal tunnel surgery. Dr recommended for her to try the ortho clinic instead. Lost all of her strength after her fall in the hospital. Normally uses forearm crutches in the house. Keeps walker close by. Was recommended to use ice vs any pain patches. Has been doing her HEP in bed from when she saw Elvie.   PERTINENT HISTORY:  R drop foot (has AFO), Feb 2025 spinal fusion (L4-5) and then I&D, R CTS  PAIN:  Are you having pain? Yes: NPRS scale: 6-7 currently, at best 4, at worst 9 Pain location: Low back and radiates to sides and down to sacrum Pain description: Burning Aggravating factors: bending, lifting, twisting; standing long periods Relieving factors: Exercises, ice  PRECAUTIONS: Fall, Back  RED FLAGS: None   WEIGHT BEARING  RESTRICTIONS: No  FALLS:  Has patient fallen in last 6 months? Yes. Number of falls 7  LIVING ENVIRONMENT: Lives with: lives with their spouse Lives in: House/apartment Stairs: No Has following equipment at home: Environmental Consultant - 4 wheeled and Crutches  OCCUPATION: Retired  PLOF: Independent  PATIENT GOALS: Improve strength/pain before her beach trip in December  NEXT MD VISIT: Dr. Joshua in December  OBJECTIVE:  Note: Objective measures were completed at Evaluation unless otherwise noted.  DIAGNOSTIC FINDINGS:  Lumbar CT 06/17/24 IMPRESSION: 1. No acute osseous abnormality in the Lumbar Spine. 2. L4-L5 decompression and fusion with no adverse hardware features. And lumbar spine degeneration elsewhere appears stable from march MRI, with multifactorial lumbar spinal stenosis at L2-L3 and L3-L4. 3.  Aortic Atherosclerosis (ICD10-I70.0).  PATIENT SURVEYS:  Modified Oswestry:  MODIFIED OSWESTRY DISABILITY SCALE  Date: 09/09/24 Score  Pain intensity 4 =  Pain medication provides me with little relief from pain.  2. Personal care (washing, dressing, etc.) 1 =  I can take care of myself normally, but it increases my pain.  3. Lifting 3 = Pain prevents me from lifting heavy weights, but I can manage light to medium weights if they are conveniently positioned  4. Walking 4 = I can only walk with crutches or a cane.  5. Sitting 2 =  Pain prevents me from sitting more than 1 hour.  6. Standing 2 =  Pain prevents me from standing more than 1 hour  7. Sleeping 1 = I can sleep well only by using pain medication.  8. Social Life 2 = Pain prevents me from participating in more energetic activities (eg. sports, dancing).  9. Traveling 2 =  My pain restricts my travel over 2 hours.  10. Employment/ Homemaking 3 = Pain prevents me from doing anything but light duties.  Total 24/50   Interpretation of scores: Score Category Description  0-20% Minimal Disability The patient can cope with most living  activities. Usually no treatment is indicated apart from advice on lifting, sitting and exercise  21-40% Moderate Disability The patient experiences more pain and difficulty with sitting, lifting and standing. Travel and social life are more difficult  and they may be disabled from work. Personal care, sexual activity and sleeping are not grossly affected, and the patient can usually be managed by conservative means  41-60% Severe Disability Pain remains the main problem in this group, but activities of daily living are affected. These patients require a detailed investigation  61-80% Crippled Back pain impinges on all aspects of the patient's life. Positive intervention is required  81-100% Bed-bound These patients are either bed-bound or exaggerating their symptoms  Bluford FORBES Zoe DELENA Karon DELENA, et al. Surgery versus conservative management of stable thoracolumbar fracture: the PRESTO feasibility RCT. [place] ([place]): [employer]; 2021 Nov. [facility] Technology Assessment, No. 25.62.) Appendix 3, Oswestry Disability Index category descriptors. Available from: Findjewelers.cz  Minimally Clinically Important Difference (MCID) = 12.8%  COGNITION: Overall cognitive status: Within functional limits for tasks assessed     SENSATION: WFL  MUSCLE LENGTH: Hamstrings: Right ~45 deg; Left ~45 deg pulls in back  POSTURE: No Significant postural limitations  PALPATION: Highly tender to gentle palpation bilat lumbar paraspinals, QL, upper glutes, R>L glute max  LUMBAR ROM: Did not assess  AROM eval  Flexion   Extension   Right lateral flexion   Left lateral flexion   Right rotation   Left rotation    (Blank rows = not tested)  LOWER EXTREMITY ROM: limited R>L hip ER, IR and abd in supine. Knee ROM assessment limited due to pt's R AFO  LOWER EXTREMITY MMT:     Active  Right eval Left eval  Hip flexion 3- 3+  Hip extension 3- 3-  Hip abduction 3 3+   Hip adduction    Hip internal rotation    Hip external rotation    Knee flexion 3+ 3+  Knee extension 3- p! In back 3  Ankle dorsiflexion    Ankle plantarflexion    Ankle inversion    Ankle eversion     (Blank rows = not tested)   LUMBAR SPECIAL TESTS:  Straight leg raise test: Positive and FABER test: Positive  FUNCTIONAL TESTS:  5 times sit to stand: TBA  GAIT: Distance walked: Into clinic Assistive device utilized: Rollator Level of assistance: SBA Comments: R foot in AFO, R LE slightly externally rotated, reciprocal pattern  TREATMENT DATE:   Initiate core strengthening and general LE conditioning/mobility. Decrease back spasm/pain with manual/modalities as indicated.   [facility] Adult PT Treatment:                                                DATE: 09/20/2024  Therapeutic Exercise: ***  Manual Therapy: ***  Neuromuscular re-ed: Patient education concurrent with modality application   Therapeutic Activity: ***  Modalities: E-stim IFC lumbar paraspinals with ice under back x 10 min, intensity to pt tolerance Self Care: ***   09/09/24  Modalities: E-stim IFC lumbar paraspinals with ice under back x 10 min, intensity to pt tolerance   PATIENT EDUCATION:  Education details: Exam findings, POC, TENs Person educated: Patient Education method: Explanation, Demonstration, and Handouts Education comprehension: verbalized understanding, returned demonstration, and needs further education  HOME EXERCISE PROGRAM: To be initiated  ASSESSMENT:  CLINICAL IMPRESSION: 09/20/2024 ***   Patient is a 63 y.o. F who was seen today for physical therapy evaluation and treatment for low back pain. PMH is significant for L4-5 spinal fusion in Feb 2025, chronic R foot drop (with AFO), recent carpal tunnel surgery, COVID with recent hospitalization, and  history of multiple falls. Assessment is significant for gross bilat LE weakness, increased lumbar paraspinal and glute spasm, reduced hip and hamstring flexibility affecting tolerance to prolonged unsupported sitting and standing tasks. Pain limits/inhibits pt's movements. Has deferred dry needling due to financial cost. States Dr. Joshua would like her to be seen here at the ortho clinic to see if we can help with this flare up. Pt will benefit from PT to address these issues and improve home/community safety.   OBJECTIVE IMPAIRMENTS: Abnormal gait, decreased activity tolerance, decreased balance, decreased coordination, decreased endurance, decreased mobility, difficulty walking, decreased ROM, decreased strength, increased edema, increased fascial restrictions, increased muscle spasms, impaired flexibility, improper body mechanics, postural dysfunction, and pain.   ACTIVITY LIMITATIONS: carrying, lifting, bending, sitting, standing, squatting, stairs, transfers, bed mobility, hygiene/grooming, and locomotion level  PARTICIPATION LIMITATIONS: meal prep, cleaning, laundry, driving, shopping, and community activity  PERSONAL FACTORS: Age, Fitness, Past/current experiences, and Time since onset of injury/illness/exacerbation are also affecting patient's functional outcome.   REHAB POTENTIAL: Good  CLINICAL DECISION MAKING: Unstable/unpredictable  EVALUATION COMPLEXITY: High   GOALS: Goals reviewed with patient? Yes  SHORT TERM GOALS: Target date: 10/07/2024   Pt will be ind with initial HEP Baseline: Goal status: INITIAL  2.  Pt will be able to perform 5x STS in </=30 sec Baseline: From prior PT note 42.97 sec 06/28/24 Goal status: INITIAL   LONG TERM GOALS: Target date: 11/04/2024   Pt will be ind with management and progression of HEP Baseline:  Goal status: INITIAL  2.  Pt will report improved pain by >/=50% Baseline:  Goal status: INITIAL  3.  Pt will have improved TUG to  </=30 sec with rollator Baseline: 36.63 sec 08/02/24 Goal status: INITIAL  4.  Pt will have improved 5x STS to </=25 sec to demo increased functional LE Baseline:  Goal status: INITIAL  5.  Pt will have improved modified Oswestry to </=12/50 to demo MCID Baseline: 24.8 Goal status: INITIAL    PLAN:  PT FREQUENCY: 2x/week  PT DURATION: 8 weeks  PLANNED INTERVENTIONS: [id number]- PT Re-evaluation, 97110-Therapeutic exercises, 97530- Therapeutic activity, 97112- Neuromuscular re-education, 97535- Self Care, [id number]- Manual therapy, [id number]- Gait training, [id number]- Aquatic Therapy, [phone]- Electrical stimulation (unattended), [id number]- Ultrasound, [id number]- Ionotophoresis 4mg /ml Dexamethasone , 79439 (1-2 muscles), [id number] (3+ muscles)- Dry Needling, Patient/Family education, Balance training, Taping, Joint mobilization, Cryotherapy, and Moist heat.  PLAN FOR NEXT SESSION: Initiate core strengthening and general LE conditioning/mobility. Decrease back spasm/pain with manual/modalities as indicated.    Marko Joshua, PT, DPT 09/20/2024, 9:58 AM

## 2024-09-27 ENCOUNTER — Ambulatory Visit: Attending: Family Medicine

## 2024-09-27 DIAGNOSIS — M5459 Other low back pain: Secondary | ICD-10-CM | POA: Insufficient documentation

## 2024-09-27 DIAGNOSIS — M6281 Muscle weakness (generalized): Secondary | ICD-10-CM | POA: Insufficient documentation

## 2024-09-27 DIAGNOSIS — R2689 Other abnormalities of gait and mobility: Secondary | ICD-10-CM | POA: Insufficient documentation

## 2024-09-27 DIAGNOSIS — R2681 Unsteadiness on feet: Secondary | ICD-10-CM | POA: Insufficient documentation

## 2024-09-27 NOTE — Therapy (Signed)
 OUTPATIENT PHYSICAL THERAPY NOTE   Patient Name: [name] MRN: [id number] DOB:04-30-1961, 63 y.o., female Today's Date: 09/27/2024  END OF SESSION:  PT End of Session - 09/27/24 1139     Visit Number 3    Number of Visits 36    Date for Recertification  11/04/24    Authorization Type [employer]    Authorization Time Period no auth reqd    Progress Note Due on Visit 30    PT Start Time 1135    PT Stop Time 1213    PT Time Calculation (min) 38 min    Activity Tolerance Patient limited by pain    Behavior During Therapy [facility] for tasks assessed/performed             Past Medical History:  Diagnosis Date   Anxiety    Aortic atherosclerosis 04/16/2021   Atypical angina    Back pain    related to spinal stenosis and disc problem, radiates down left buttocks to leg., weakness occ.   Chest pain    a. 03/2015 Cath: nl cors; b. 03/2021 Cor CTA: Ca2+ = 0. Nl Cors.   Chronic pain syndrome    Dyspnea    hx   GERD (gastroesophageal reflux disease)    Grade I diastolic dysfunction    Headache    Hyperlipidemia    Hypertension    Lumbar post-laminectomy syndrome    LVH (left ventricular hypertrophy) 12/15/2020   a. 11/2020 Echo: EF 65-70%, no rwma, sev asymm LVH with IVSd 1.9 cm. No LVOT obs @ rest. Gr1 DD. Triv MR.   PONV (postoperative nausea and vomiting)    Pulmonary nodules    a. 03/2021 CT Chest: 3mm pulm nodules in bilat lower lobes. F/u 1 yr.   Right foot drop    Syncope    a. 11/2020 Zio: No significant arrhythmias.   Vaginal foreign object    Uses Femring    Past Surgical History:  Procedure Laterality Date   ABDOMINAL HYSTERECTOMY     ANTERIOR CERVICAL DECOMP/DISCECTOMY FUSION N/A 01/03/2022   Procedure: Anterior Cervical Decompression Fusion - Cervical four-Cervical five - Cervical five-Cervical six;  Surgeon: Joshua Alm RAMAN, MD;  Location: [facility] OR;  Service: Neurosurgery;  Laterality: N/A;   BIOPSY  12/16/2020   Procedure: BIOPSY;  Surgeon:  Aneita Gwendlyn DASEN, MD;  Location: [facility] ENDOSCOPY;  Service: Endoscopy;;   CARDIAC CATHETERIZATION N/A 04/18/2015   Procedure: Left Heart Cath and Coronary Angiography;  Surgeon: Rober Chroman, MD;  Location: [facility] INVASIVE CV LAB;  Service: Cardiovascular;  Laterality: N/A;   COLONOSCOPY     COLONOSCOPY W/ BIOPSIES AND POLYPECTOMY  2018   ESOPHAGOGASTRODUODENOSCOPY (EGD) WITH PROPOFOL  N/A 12/16/2020   Procedure: ESOPHAGOGASTRODUODENOSCOPY (EGD) WITH PROPOFOL ;  Surgeon: Aneita Gwendlyn DASEN, MD;  Location: [facility] ENDOSCOPY;  Service: Endoscopy;  Laterality: N/A;   FOOT SURGERY Bilateral    [facility] bunion,bone spur, tendon (1) -6'16, (1)-10'16   HEMATOMA EVACUATION N/A 01/05/2022   Procedure: Cervical Wound Exploration;  Surgeon: Gillie Duncans, MD;  Location: [facility] OR;  Service: Neurosurgery;  Laterality: N/A;   IR EPIDUROGRAPHY  07/21/2018   LUMBAR LAMINECTOMY/DECOMPRESSION MICRODISCECTOMY Bilateral 12/28/2015   Procedure: MICRO LUMBAR DECOMPRESSION L4 - L5 BILATERALLY;  Surgeon: Reyes Billing, MD;  Location: [facility] ORS;  Service: Orthopedics;  Laterality: Bilateral;   LUMBAR LAMINECTOMY/DECOMPRESSION MICRODISCECTOMY Bilateral 03/04/2018   Procedure: Revision of Microlumbar Decompression Bilateral Lumbar Four-Five;  Surgeon: Billing Reyes, MD;  Location: [facility] OR;  Service: Orthopedics;  Laterality: Bilateral;  90  mins   LUMBAR WOUND DEBRIDEMENT N/A 12/25/2023   Procedure: Irrigation and debridement of lumbar wound;  Surgeon: Joshua Alm Hamilton, MD;  Location: [facility] OR;  Service: Neurosurgery;  Laterality: N/A;   POSTERIOR FUSION PEDICLE SCREW PLACEMENT N/A 12/07/2023   Procedure: LUMBAR FOUR-FIVE POSTERIOR LATERAL FUSION;  Surgeon: Joshua Alm Hamilton, MD;  Location: [facility] OR;  Service: Neurosurgery;  Laterality: N/A;   SAVORY DILATION N/A 12/16/2020   Procedure: SAVORY DILATION;  Surgeon: Aneita Gwendlyn DASEN, MD;  Location: [facility] ENDOSCOPY;  Service: Endoscopy;  Laterality: N/A;   SPINAL CORD STIMULATOR INSERTION N/A  09/28/2019   Procedure: THORACIC SPINAL CORD STIMULATOR INSERTION;  Surgeon: Burnetta Aures, MD;  Location: [facility] OR;  Service: Orthopedics;  Laterality: N/A;  2.5 hrs   SPINAL CORD STIMULATOR REMOVAL N/A 05/27/2021   Procedure: LUMBAR SPINAL CORD STIMULATOR REMOVAL;  Surgeon: Bluford Standing, MD;  Location: [facility] ORS;  Service: Neurosurgery;  Laterality: N/A;   TUBAL LIGATION     WISDOM TOOTH EXTRACTION     WOUND EXPLORATION N/A 03/04/2018   Procedure: EXPLORATION OF LUMBAR DECOMPRESSION WOUND;  Surgeon: Duwayne Purchase, MD;  Location: [facility] OR;  Service: Orthopedics;  Laterality: N/A;   Patient Active Problem List   Diagnosis Date Noted   Vitamin D  deficiency, unspecified 04/13/2024   Seasonal allergic rhinitis due to pollen 01/29/2024   Delayed wound healing 12/25/2023   S/P lumbar fusion 12/07/2023   Pain in thumb joint with movement of right hand 08/12/2023   Localized primary osteoarthritis of carpometacarpal joint of right thumb 07/29/2023   Right foot drop 07/29/2023   Frequent falls 04/14/2023   Gastroesophageal reflux disease 04/14/2023   Right upper extremity numbness 09/29/2022   Stage 3a chronic kidney disease (HCC) 09/29/2022   Cervical spondylosis with radiculopathy 01/05/2022   S/P cervical spinal fusion 01/03/2022   Cervical disc disorder with radiculopathy of cervical region 11/27/2021   Uncontrolled pain 05/27/2021   Syncope 05/13/2021   Depression with anxiety 05/13/2021   Chronic diastolic CHF (congestive heart failure) (HCC) 05/13/2021   Chronic back pain 05/13/2021   Chest tightness 05/13/2021   Lower abdominal pain 05/13/2021   Syncope and collapse 01/01/2021   Abnormal echocardiogram 01/01/2021   BPPV (benign paroxysmal positional vertigo), left 12/26/2020   Odynophagia    Dysphagia 12/15/2020   Postural dizziness with presyncope 12/14/2020   Lumbar post-laminectomy syndrome    Chronic pain 09/28/2019   Lumbar spinal stenosis    Radicular pain    Gait disorder     Difficulty in urination 07/13/2018   Back pain 07/12/2018   Hyperlipidemia 04/16/2018   GAD (generalized anxiety disorder) 04/16/2018   AKI (acute kidney injury)    Benign essential HTN    Major depression, recurrent    Lumbar radiculopathy 03/09/2018   Paraparesis (HCC) 03/09/2018   Essential hypertension    Chronic nonintractable headache    Leukocytosis    Acute blood loss anemia    Postoperative pain    Generalized weakness    Spinal stenosis at L4-L5 level 03/04/2018   Spinal stenosis of lumbar region 12/28/2015   Chest pain 04/16/2015    PCP: Jordan, [name], MD  REFERRING PROVIDER: Joshua Alm Hamilton, MD  REFERRING DIAG: M43.16 (ICD-10-CM) - Spondylolisthesis, lumbar region  Rationale for Evaluation and Treatment: Rehabilitation  THERAPY DIAG:  Other low back pain  Muscle weakness (generalized)  Other abnormalities of gait and mobility  Unsteadiness on feet  ONSET DATE: Spinal fusion in Feb, 06/17/24 hospital admission for COVID-19  SUBJECTIVE:  SUBJECTIVE STATEMENT: 09/27/2024 Patient reports that she is having a lot of pain. She spoke with Elvie from her previous PT, who encouraged her to continue with her HEP and cold pack. She was unable to find her home TENS unit. She states that she has been hurting to the point of tears over the past week and is considering a call to MD for injection scheduling.   EVAL: Reports she was doing well in PT with Elvie at neuro s/p spinal fusion. And then pt states she was hospitalized with COVID in August. Nursing staff wanted to get her O2 with activity but she fell. Was still recovering from spine fusion at the time. Back became inflamed and had more pain. Was recommended to do dry needling but pt states she is unable to afford the out of  pocket payment. D/C-ed from PT from neuro clinic at the time and had R carpal tunnel surgery. Dr recommended for her to try the ortho clinic instead. Lost all of her strength after her fall in the hospital. Normally uses forearm crutches in the house. Keeps walker close by. Was recommended to use ice vs any pain patches. Has been doing her HEP in bed from when she saw Elvie.   PERTINENT HISTORY:  R drop foot (has AFO), Feb 2025 spinal fusion (L4-5) and then I&D, R CTS  PAIN:  Are you having pain? Yes: NPRS scale: 6-7 currently, at best 4, at worst 9 Pain location: Low back and radiates to sides and down to sacrum Pain description: Burning Aggravating factors: bending, lifting, twisting; standing long periods Relieving factors: Exercises, ice  PRECAUTIONS: Fall, Back  RED FLAGS: None   WEIGHT BEARING RESTRICTIONS: No  FALLS:  Has patient fallen in last 6 months? Yes. Number of falls 7  LIVING ENVIRONMENT: Lives with: lives with their spouse Lives in: House/apartment Stairs: No Has following equipment at home: Environmental Consultant - 4 wheeled and Crutches  OCCUPATION: Retired  PLOF: Independent  PATIENT GOALS: Improve strength/pain before her beach trip in December  NEXT MD VISIT: Dr. Joshua in December  OBJECTIVE:  Note: Objective measures were completed at Evaluation unless otherwise noted.  DIAGNOSTIC FINDINGS:  Lumbar CT 06/17/24 IMPRESSION: 1. No acute osseous abnormality in the Lumbar Spine. 2. L4-L5 decompression and fusion with no adverse hardware features. And lumbar spine degeneration elsewhere appears stable from march MRI, with multifactorial lumbar spinal stenosis at L2-L3 and L3-L4. 3.  Aortic Atherosclerosis (ICD10-I70.0).  PATIENT SURVEYS:  Modified Oswestry:  MODIFIED OSWESTRY DISABILITY SCALE  Date: 09/09/24 Score  Pain intensity 4 =  Pain medication provides me with little relief from pain.  2. Personal care (washing, dressing, etc.) 1 =  I can take care of  myself normally, but it increases my pain.  3. Lifting 3 = Pain prevents me from lifting heavy weights, but I can manage light to medium weights if they are conveniently positioned  4. Walking 4 = I can only walk with crutches or a cane.  5. Sitting 2 =  Pain prevents me from sitting more than 1 hour.  6. Standing 2 =  Pain prevents me from standing more than 1 hour  7. Sleeping 1 = I can sleep well only by using pain medication.  8. Social Life 2 = Pain prevents me from participating in more energetic activities (eg. sports, dancing).  9. Traveling 2 =  My pain restricts my travel over 2 hours.  10. Employment/ Homemaking 3 = Pain prevents me from doing anything but  light duties.  Total 24/50   Interpretation of scores: Score Category Description  0-20% Minimal Disability The patient can cope with most living activities. Usually no treatment is indicated apart from advice on lifting, sitting and exercise  21-40% Moderate Disability The patient experiences more pain and difficulty with sitting, lifting and standing. Travel and social life are more difficult and they may be disabled from work. Personal care, sexual activity and sleeping are not grossly affected, and the patient can usually be managed by conservative means  41-60% Severe Disability Pain remains the main problem in this group, but activities of daily living are affected. These patients require a detailed investigation  61-80% Crippled Back pain impinges on all aspects of the patient's life. Positive intervention is required  81-100% Bed-bound These patients are either bed-bound or exaggerating their symptoms  Bluford FORBES Zoe DELENA Karon DELENA, et al. Surgery versus conservative management of stable thoracolumbar fracture: the PRESTO feasibility RCT. [place] ([place]): [employer]; 2021 Nov. [facility] Technology Assessment, No. 25.62.) Appendix 3, Oswestry Disability Index category descriptors. Available from:  Findjewelers.cz  Minimally Clinically Important Difference (MCID) = 12.8%  COGNITION: Overall cognitive status: Within functional limits for tasks assessed     SENSATION: WFL  MUSCLE LENGTH: Hamstrings: Right ~45 deg; Left ~45 deg pulls in back  POSTURE: No Significant postural limitations  PALPATION: Highly tender to gentle palpation bilat lumbar paraspinals, QL, upper glutes, R>L glute max  LUMBAR ROM: Did not assess  AROM eval  Flexion   Extension   Right lateral flexion   Left lateral flexion   Right rotation   Left rotation    (Blank rows = not tested)  LOWER EXTREMITY ROM: limited R>L hip ER, IR and abd in supine. Knee ROM assessment limited due to pt's R AFO  LOWER EXTREMITY MMT:     Active  Right eval Left eval  Hip flexion 3- 3+  Hip extension 3- 3-  Hip abduction 3 3+  Hip adduction    Hip internal rotation    Hip external rotation    Knee flexion 3+ 3+  Knee extension 3- p! In back 3  Ankle dorsiflexion    Ankle plantarflexion    Ankle inversion    Ankle eversion     (Blank rows = not tested)   LUMBAR SPECIAL TESTS:  Straight leg raise test: Positive and FABER test: Positive  FUNCTIONAL TESTS:  5 times sit to stand: TBA  GAIT: Distance walked: Into clinic Assistive device utilized: Rollator Level of assistance: SBA Comments: R foot in AFO, R LE slightly externally rotated, reciprocal pattern  TREATMENT DATE:    [facility] Adult PT Treatment:                                                DATE: 09/27/2024  Therapeutic Exercise:  Seated pelvic tilt x 10 reps Seated pelvic tilt on dynadisc, 2 x 15 reps   Self-Care:  Patient education concurrent with e-stim application  Follow up regarding lifestyle factors and self-management techniques to address chroniciity and severity of pain symptoms. Including monitoring nutrition, hydration and stress management Reinforced goal of OP PT and gradual reintroduction of  active interventions at future visits  Discussion regarding normal psychological impact of chronic and severe pain; encouraged patient to acknowledge the emotions and recognize that normalcy of this experience/impact   Modalities: E-stim  IFC lumbar paraspinals with ice under back x 15 min, intensity to pt tolerance 9.3v   [facility] Adult PT Treatment:                                                DATE: 09/20/2024  Self-Care:  Patient education concurrent with e-stim application  Extended discussion regarding lifestyle factors and self-management techniques to address chroniciity and severity of pain symptoms. Including monitoring nutrition, hydration and stress management Encourage patient to increase water  intake by 1 cup/daily until next visit, but she's unwilling to commit to this Encouraged patient to add diaphragmatic breathing exercises 1x/day. She is very hesitant to commit to this, stating she doesn't have time. Further encouraged her to incorporate while doing something else, like watching tv.  Reinforced goal of OP PT and indicated use of active interventions at future visits   Modalities: E-stim IFC lumbar paraspinals with ice under back x 10 min, intensity to pt tolerance    09/09/24                                                                                                                              Modalities: E-stim IFC lumbar paraspinals with ice under back x 10 min, intensity to pt tolerance   PATIENT EDUCATION:  Education details: Exam findings, POC, TENs Person educated: Patient Education method: Explanation, Demonstration, and Handouts Education comprehension: verbalized understanding, returned demonstration, and needs further education  HOME EXERCISE PROGRAM: Access Code: [email or web address] URL: [email or web address] Date: 09/20/2024 Prepared by: Marko Molt  Exercises - Seated Diaphragmatic Breathing  - 2 x daily - 7 x weekly - 5 reps  Patient  Education - Ice  ASSESSMENT:  CLINICAL IMPRESSION: 09/27/2024 [place] was able to tolerate introduction of lumbopelvic exercises today following TENS unit. She has been working on her water  intake since last visit and demonstrates overall improved motivation. We will continue to progress volume of active interventions at following sessions.   Patient is a 63 y.o. F who was seen today for physical therapy evaluation and treatment for low back pain. PMH is significant for L4-5 spinal fusion in Feb 2025, chronic R foot drop (with AFO), recent carpal tunnel surgery, COVID with recent hospitalization, and history of multiple falls. Assessment is significant for gross bilat LE weakness, increased lumbar paraspinal and glute spasm, reduced hip and hamstring flexibility affecting tolerance to prolonged unsupported sitting and standing tasks. Pain limits/inhibits pt's movements. Has deferred dry needling due to financial cost. States Dr. Molt would like her to be seen here at the ortho clinic to see if we can help with this flare up. Pt will benefit from PT to address these issues and improve home/community safety.   OBJECTIVE IMPAIRMENTS: Abnormal gait, decreased activity tolerance, decreased balance, decreased coordination, decreased endurance, decreased mobility, difficulty walking, decreased ROM, decreased strength,  increased edema, increased fascial restrictions, increased muscle spasms, impaired flexibility, improper body mechanics, postural dysfunction, and pain.   ACTIVITY LIMITATIONS: carrying, lifting, bending, sitting, standing, squatting, stairs, transfers, bed mobility, hygiene/grooming, and locomotion level  PARTICIPATION LIMITATIONS: meal prep, cleaning, laundry, driving, shopping, and community activity  PERSONAL FACTORS: Age, Fitness, Past/current experiences, and Time since onset of injury/illness/exacerbation are also affecting patient's functional outcome.   REHAB POTENTIAL:  Good  CLINICAL DECISION MAKING: Unstable/unpredictable  EVALUATION COMPLEXITY: High   GOALS: Goals reviewed with patient? Yes  SHORT TERM GOALS: Target date: 10/07/2024   Pt will be ind with initial HEP Baseline: Goal status: INITIAL  2.  Pt will be able to perform 5x STS in </=30 sec Baseline: From prior PT note 42.97 sec 06/28/24 Goal status: INITIAL   LONG TERM GOALS: Target date: 11/04/2024   Pt will be ind with management and progression of HEP Baseline:  Goal status: INITIAL  2.  Pt will report improved pain by >/=50% Baseline:  Goal status: INITIAL  3.  Pt will have improved TUG to </=30 sec with rollator Baseline: 36.63 sec 08/02/24 Goal status: INITIAL  4.  Pt will have improved 5x STS to </=25 sec to demo increased functional LE Baseline:  Goal status: INITIAL  5.  Pt will have improved modified Oswestry to </=12/50 to demo MCID Baseline: 24.8 Goal status: INITIAL    PLAN:  PT FREQUENCY: 2x/week  PT DURATION: 8 weeks  PLANNED INTERVENTIONS: [id number]- PT Re-evaluation, 97110-Therapeutic exercises, 97530- Therapeutic activity, 97112- Neuromuscular re-education, 97535- Self Care, [id number]- Manual therapy, [phone]- Gait training, [phone]- Aquatic Therapy, [phone]- Electrical stimulation (unattended), [id number]- Ultrasound, [id number]- Ionotophoresis 4mg /ml Dexamethasone , 79439 (1-2 muscles), [id number] (3+ muscles)- Dry Needling, Patient/Family education, Balance training, Taping, Joint mobilization, Cryotherapy, and Moist heat.  PLAN FOR NEXT SESSION: Initiate core strengthening and general LE conditioning/mobility. Decrease back spasm/pain with manual/modalities as indicated.    Marko Molt, PT, DPT  09/27/2024 2:49 PM

## 2024-09-29 ENCOUNTER — Ambulatory Visit

## 2024-09-29 DIAGNOSIS — M6281 Muscle weakness (generalized): Secondary | ICD-10-CM

## 2024-09-29 DIAGNOSIS — R2689 Other abnormalities of gait and mobility: Secondary | ICD-10-CM

## 2024-09-29 DIAGNOSIS — M5459 Other low back pain: Secondary | ICD-10-CM

## 2024-09-29 NOTE — Therapy (Signed)
 OUTPATIENT PHYSICAL THERAPY NOTE   Patient Name: [name] MRN: [id number] DOB:1961-06-29, 63 y.o., female Today's Date: 09/29/2024  END OF SESSION:  PT End of Session - 09/29/24 1146     Visit Number 4    Number of Visits 36    Date for Recertification  11/04/24    Authorization Type [employer] Medicare    Authorization Time Period no auth reqd    Progress Note Due on Visit 30    PT Start Time 1133    PT Stop Time 1215    PT Time Calculation (min) 42 min    Activity Tolerance Patient limited by pain    Behavior During Therapy [facility] for tasks assessed/performed              Past Medical History:  Diagnosis Date   Anxiety    Aortic atherosclerosis 04/16/2021   Atypical angina    Back pain    related to spinal stenosis and disc problem, radiates down left buttocks to leg., weakness occ.   Chest pain    a. 03/2015 Cath: nl cors; b. 03/2021 Cor CTA: Ca2+ = 0. Nl Cors.   Chronic pain syndrome    Dyspnea    hx   GERD (gastroesophageal reflux disease)    Grade I diastolic dysfunction    Headache    Hyperlipidemia    Hypertension    Lumbar post-laminectomy syndrome    LVH (left ventricular hypertrophy) 12/15/2020   a. 11/2020 Echo: EF 65-70%, no rwma, sev asymm LVH with IVSd 1.9 cm. No LVOT obs @ rest. Gr1 DD. Triv MR.   PONV (postoperative nausea and vomiting)    Pulmonary nodules    a. 03/2021 CT Chest: 3mm pulm nodules in bilat lower lobes. F/u 1 yr.   Right foot drop    Syncope    a. 11/2020 Zio: No significant arrhythmias.   Vaginal foreign object    Uses Femring    Past Surgical History:  Procedure Laterality Date   ABDOMINAL HYSTERECTOMY     ANTERIOR CERVICAL DECOMP/DISCECTOMY FUSION N/A 01/03/2022   Procedure: Anterior Cervical Decompression Fusion - Cervical four-Cervical five - Cervical five-Cervical six;  Surgeon: Joshua Alm RAMAN, MD;  Location: [facility] OR;  Service: Neurosurgery;  Laterality: N/A;   BIOPSY  12/16/2020   Procedure: BIOPSY;  Surgeon:  Aneita Gwendlyn DASEN, MD;  Location: [facility] ENDOSCOPY;  Service: Endoscopy;;   CARDIAC CATHETERIZATION N/A 04/18/2015   Procedure: Left Heart Cath and Coronary Angiography;  Surgeon: Rober Chroman, MD;  Location: [facility] INVASIVE CV LAB;  Service: Cardiovascular;  Laterality: N/A;   COLONOSCOPY     COLONOSCOPY W/ BIOPSIES AND POLYPECTOMY  2018   ESOPHAGOGASTRODUODENOSCOPY (EGD) WITH PROPOFOL  N/A 12/16/2020   Procedure: ESOPHAGOGASTRODUODENOSCOPY (EGD) WITH PROPOFOL ;  Surgeon: Aneita Gwendlyn DASEN, MD;  Location: [facility] ENDOSCOPY;  Service: Endoscopy;  Laterality: N/A;   FOOT SURGERY Bilateral    [facility] bunion,bone spur, tendon (1) -6'16, (1)-10'16   HEMATOMA EVACUATION N/A 01/05/2022   Procedure: Cervical Wound Exploration;  Surgeon: Gillie Duncans, MD;  Location: [facility] OR;  Service: Neurosurgery;  Laterality: N/A;   IR EPIDUROGRAPHY  07/21/2018   LUMBAR LAMINECTOMY/DECOMPRESSION MICRODISCECTOMY Bilateral 12/28/2015   Procedure: MICRO LUMBAR DECOMPRESSION L4 - L5 BILATERALLY;  Surgeon: Reyes Billing, MD;  Location: [facility] ORS;  Service: Orthopedics;  Laterality: Bilateral;   LUMBAR LAMINECTOMY/DECOMPRESSION MICRODISCECTOMY Bilateral 03/04/2018   Procedure: Revision of Microlumbar Decompression Bilateral Lumbar Four-Five;  Surgeon: Billing Reyes, MD;  Location: [facility] OR;  Service: Orthopedics;  Laterality: Bilateral;  90 mins   LUMBAR WOUND DEBRIDEMENT N/A 12/25/2023   Procedure: Irrigation and debridement of lumbar wound;  Surgeon: Joshua Alm Hamilton, MD;  Location: [facility] OR;  Service: Neurosurgery;  Laterality: N/A;   POSTERIOR FUSION PEDICLE SCREW PLACEMENT N/A 12/07/2023   Procedure: LUMBAR FOUR-FIVE POSTERIOR LATERAL FUSION;  Surgeon: Joshua Alm Hamilton, MD;  Location: [facility] OR;  Service: Neurosurgery;  Laterality: N/A;   SAVORY DILATION N/A 12/16/2020   Procedure: SAVORY DILATION;  Surgeon: Aneita Gwendlyn DASEN, MD;  Location: [facility] ENDOSCOPY;  Service: Endoscopy;  Laterality: N/A;   SPINAL CORD STIMULATOR INSERTION N/A  09/28/2019   Procedure: THORACIC SPINAL CORD STIMULATOR INSERTION;  Surgeon: Burnetta Aures, MD;  Location: [facility] OR;  Service: Orthopedics;  Laterality: N/A;  2.5 hrs   SPINAL CORD STIMULATOR REMOVAL N/A 05/27/2021   Procedure: LUMBAR SPINAL CORD STIMULATOR REMOVAL;  Surgeon: Bluford Standing, MD;  Location: [facility] ORS;  Service: Neurosurgery;  Laterality: N/A;   TUBAL LIGATION     WISDOM TOOTH EXTRACTION     WOUND EXPLORATION N/A 03/04/2018   Procedure: EXPLORATION OF LUMBAR DECOMPRESSION WOUND;  Surgeon: Duwayne Purchase, MD;  Location: [facility] OR;  Service: Orthopedics;  Laterality: N/A;   Patient Active Problem List   Diagnosis Date Noted   Vitamin D  deficiency, unspecified 04/13/2024   Seasonal allergic rhinitis due to pollen 01/29/2024   Delayed wound healing 12/25/2023   S/P lumbar fusion 12/07/2023   Pain in thumb joint with movement of right hand 08/12/2023   Localized primary osteoarthritis of carpometacarpal joint of right thumb 07/29/2023   Right foot drop 07/29/2023   Frequent falls 04/14/2023   Gastroesophageal reflux disease 04/14/2023   Right upper extremity numbness 09/29/2022   Stage 3a chronic kidney disease (HCC) 09/29/2022   Cervical spondylosis with radiculopathy 01/05/2022   S/P cervical spinal fusion 01/03/2022   Cervical disc disorder with radiculopathy of cervical region 11/27/2021   Uncontrolled pain 05/27/2021   Syncope 05/13/2021   Depression with anxiety 05/13/2021   Chronic diastolic CHF (congestive heart failure) (HCC) 05/13/2021   Chronic back pain 05/13/2021   Chest tightness 05/13/2021   Lower abdominal pain 05/13/2021   Syncope and collapse 01/01/2021   Abnormal echocardiogram 01/01/2021   BPPV (benign paroxysmal positional vertigo), left 12/26/2020   Odynophagia    Dysphagia 12/15/2020   Postural dizziness with presyncope 12/14/2020   Lumbar post-laminectomy syndrome    Chronic pain 09/28/2019   Lumbar spinal stenosis    Radicular pain    Gait disorder     Difficulty in urination 07/13/2018   Back pain 07/12/2018   Hyperlipidemia 04/16/2018   GAD (generalized anxiety disorder) 04/16/2018   AKI (acute kidney injury)    Benign essential HTN    Major depression, recurrent    Lumbar radiculopathy 03/09/2018   Paraparesis (HCC) 03/09/2018   Essential hypertension    Chronic nonintractable headache    Leukocytosis    Acute blood loss anemia    Postoperative pain    Generalized weakness    Spinal stenosis at L4-L5 level 03/04/2018   Spinal stenosis of lumbar region 12/28/2015   Chest pain 04/16/2015    PCP: Jordan, [name], MD  REFERRING PROVIDER: Joshua Alm Hamilton, MD  REFERRING DIAG: M43.16 (ICD-10-CM) - Spondylolisthesis, lumbar region  Rationale for Evaluation and Treatment: Rehabilitation  THERAPY DIAG:  Other low back pain  Muscle weakness (generalized)  Other abnormalities of gait and mobility  ONSET DATE: Spinal fusion in Feb, 06/17/24 hospital admission for COVID-19  SUBJECTIVE:  SUBJECTIVE STATEMENT: 09/29/2024 Patient reports that she has 7/10 pain today. Her pain was bad yesterday after bending/kneeling down and feeling like she couldn't move from that position. She is unsure how exactly she got up, but seems that she was able to use the bed to press back up to standing position.    EVAL: Reports she was doing well in PT with Elvie at neuro s/p spinal fusion. And then pt states she was hospitalized with COVID in August. Nursing staff wanted to get her O2 with activity but she fell. Was still recovering from spine fusion at the time. Back became inflamed and had more pain. Was recommended to do dry needling but pt states she is unable to afford the out of pocket payment. D/C-ed from PT from neuro clinic at the time and had R carpal  tunnel surgery. Dr recommended for her to try the ortho clinic instead. Lost all of her strength after her fall in the hospital. Normally uses forearm crutches in the house. Keeps walker close by. Was recommended to use ice vs any pain patches. Has been doing her HEP in bed from when she saw Elvie.   PERTINENT HISTORY:  R drop foot (has AFO), Feb 2025 spinal fusion (L4-5) and then I&D, R CTS  PAIN:  Are you having pain? Yes: NPRS scale: 6-7 currently, at best 4, at worst 9 Pain location: Low back and radiates to sides and down to sacrum Pain description: Burning Aggravating factors: bending, lifting, twisting; standing long periods Relieving factors: Exercises, ice  PRECAUTIONS: Fall, Back  RED FLAGS: None   WEIGHT BEARING RESTRICTIONS: No  FALLS:  Has patient fallen in last 6 months? Yes. Number of falls 7  LIVING ENVIRONMENT: Lives with: lives with their spouse Lives in: House/apartment Stairs: No Has following equipment at home: Environmental Consultant - 4 wheeled and Crutches  OCCUPATION: Retired  PLOF: Independent  PATIENT GOALS: Improve strength/pain before her beach trip in December  NEXT MD VISIT: Dr. Joshua in December  OBJECTIVE:  Note: Objective measures were completed at Evaluation unless otherwise noted.  DIAGNOSTIC FINDINGS:  Lumbar CT 06/17/24 IMPRESSION: 1. No acute osseous abnormality in the Lumbar Spine. 2. L4-L5 decompression and fusion with no adverse hardware features. And lumbar spine degeneration elsewhere appears stable from march MRI, with multifactorial lumbar spinal stenosis at L2-L3 and L3-L4. 3.  Aortic Atherosclerosis (ICD10-I70.0).  PATIENT SURVEYS:  Modified Oswestry:  MODIFIED OSWESTRY DISABILITY SCALE  Date: 09/09/24 Score  Pain intensity 4 =  Pain medication provides me with little relief from pain.  2. Personal care (washing, dressing, etc.) 1 =  I can take care of myself normally, but it increases my pain.  3. Lifting 3 = Pain prevents me from  lifting heavy weights, but I can manage light to medium weights if they are conveniently positioned  4. Walking 4 = I can only walk with crutches or a cane.  5. Sitting 2 =  Pain prevents me from sitting more than 1 hour.  6. Standing 2 =  Pain prevents me from standing more than 1 hour  7. Sleeping 1 = I can sleep well only by using pain medication.  8. Social Life 2 = Pain prevents me from participating in more energetic activities (eg. sports, dancing).  9. Traveling 2 =  My pain restricts my travel over 2 hours.  10. Employment/ Homemaking 3 = Pain prevents me from doing anything but light duties.  Total 24/50   Interpretation of scores: Score Category Description  0-20% Minimal Disability The patient can cope with most living activities. Usually no treatment is indicated apart from advice on lifting, sitting and exercise  21-40% Moderate Disability The patient experiences more pain and difficulty with sitting, lifting and standing. Travel and social life are more difficult and they may be disabled from work. Personal care, sexual activity and sleeping are not grossly affected, and the patient can usually be managed by conservative means  41-60% Severe Disability Pain remains the main problem in this group, but activities of daily living are affected. These patients require a detailed investigation  61-80% Crippled Back pain impinges on all aspects of the patient's life. Positive intervention is required  81-100% Bed-bound These patients are either bed-bound or exaggerating their symptoms  Bluford FORBES Zoe DELENA Karon DELENA, et al. Surgery versus conservative management of stable thoracolumbar fracture: the PRESTO feasibility RCT. [place] ([place]): [employer]; 2021 Nov. [facility] [employer] Assessment, No. 25.62.) Appendix 3, Oswestry Disability Index category descriptors. Available from: Findjewelers.cz  Minimally Clinically Important Difference (MCID) =  12.8%  COGNITION: Overall cognitive status: Within functional limits for tasks assessed     SENSATION: WFL  MUSCLE LENGTH: Hamstrings: Right ~45 deg; Left ~45 deg pulls in back  POSTURE: No Significant postural limitations  PALPATION: Highly tender to gentle palpation bilat lumbar paraspinals, QL, upper glutes, R>L glute max  LUMBAR ROM: Did not assess  AROM eval  Flexion   Extension   Right lateral flexion   Left lateral flexion   Right rotation   Left rotation    (Blank rows = not tested)  LOWER EXTREMITY ROM: limited R>L hip ER, IR and abd in supine. Knee ROM assessment limited due to pt's R AFO  LOWER EXTREMITY MMT:     Active  Right eval Left eval  Hip flexion 3- 3+  Hip extension 3- 3-  Hip abduction 3 3+  Hip adduction    Hip internal rotation    Hip external rotation    Knee flexion 3+ 3+  Knee extension 3- p! In back 3  Ankle dorsiflexion    Ankle plantarflexion    Ankle inversion    Ankle eversion     (Blank rows = not tested)   LUMBAR SPECIAL TESTS:  Straight leg raise test: Positive and FABER test: Positive  FUNCTIONAL TESTS:  5 times sit to stand: TBA  GAIT: Distance walked: Into clinic Assistive device utilized: Rollator Level of assistance: SBA Comments: R foot in AFO, R LE slightly externally rotated, reciprocal pattern  TREATMENT DATE:   [facility] Adult PT Treatment:                                                DATE: 09/29/2024  Neuromuscular Re-education  Seated pelvic tilt on dynadisc, 3 x 15 reps  Consider at next visit Seated marches on dynadisc, 2 x 10 reps   Self-care: Patient education, reinforcing safe use of ice pack application at home  Reinforcing fall risk management, and keeping phone close for emergencies, especially when home alone  Modalities: E-stim IFC lumbar paraspinals with ice under back x 20 min, intensity to pt tolerance 12.5v  [facility] Adult PT Treatment:  DATE:  09/27/2024  Therapeutic Exercise:  Seated pelvic tilt x 10 reps Seated pelvic tilt on dynadisc, 2 x 15 reps   Self-Care:  Patient education concurrent with e-stim application  Follow up regarding lifestyle factors and self-management techniques to address chroniciity and severity of pain symptoms. Including monitoring nutrition, hydration and stress management Reinforced goal of OP PT and gradual reintroduction of active interventions at future visits  Discussion regarding normal psychological impact of chronic and severe pain; encouraged patient to acknowledge the emotions and recognize that normalcy of this experience/impact   Modalities: E-stim IFC lumbar paraspinals with ice under back x 15 min, intensity to pt tolerance 9.3v     PATIENT EDUCATION:  Education details: Exam findings, POC, TENs Person educated: Patient Education method: Explanation, Demonstration, and Handouts Education comprehension: verbalized understanding, returned demonstration, and needs further education  HOME EXERCISE PROGRAM: Access Code: [email or web address] URL: [email or web address] Date: 09/20/2024 Prepared by: Marko Molt  Exercises - Seated Diaphragmatic Breathing  - 2 x daily - 7 x weekly - 5 reps  Patient Education - Ice  ASSESSMENT:  CLINICAL IMPRESSION: 09/29/2024 Tomorrow was able to tolerate lumbopelvic exercises today following TENS unit. She reports decreased pain severity following modalities and exercises today. We will continue to progress volume of active interventions at following sessions.   Patient is a 63 y.o. F who was seen today for physical therapy evaluation and treatment for low back pain. PMH is significant for L4-5 spinal fusion in Feb 2025, chronic R foot drop (with AFO), recent carpal tunnel surgery, COVID with recent hospitalization, and history of multiple falls. Assessment is significant for gross bilat LE weakness, increased lumbar paraspinal and glute spasm,  reduced hip and hamstring flexibility affecting tolerance to prolonged unsupported sitting and standing tasks. Pain limits/inhibits pt's movements. Has deferred dry needling due to financial cost. States Dr. Molt would like her to be seen here at the ortho clinic to see if we can help with this flare up. Pt will benefit from PT to address these issues and improve home/community safety.   OBJECTIVE IMPAIRMENTS: Abnormal gait, decreased activity tolerance, decreased balance, decreased coordination, decreased endurance, decreased mobility, difficulty walking, decreased ROM, decreased strength, increased edema, increased fascial restrictions, increased muscle spasms, impaired flexibility, improper body mechanics, postural dysfunction, and pain.   ACTIVITY LIMITATIONS: carrying, lifting, bending, sitting, standing, squatting, stairs, transfers, bed mobility, hygiene/grooming, and locomotion level  PARTICIPATION LIMITATIONS: meal prep, cleaning, laundry, driving, shopping, and community activity  PERSONAL FACTORS: Age, Fitness, Past/current experiences, and Time since onset of injury/illness/exacerbation are also affecting patient's functional outcome.   REHAB POTENTIAL: Good  CLINICAL DECISION MAKING: Unstable/unpredictable  EVALUATION COMPLEXITY: High   GOALS: Goals reviewed with patient? Yes  SHORT TERM GOALS: Target date: 10/07/2024   Pt will be ind with initial HEP Baseline: Goal status: INITIAL  2.  Pt will be able to perform 5x STS in </=30 sec Baseline: From prior PT note 42.97 sec 06/28/24 Goal status: INITIAL   LONG TERM GOALS: Target date: 11/04/2024   Pt will be ind with management and progression of HEP Baseline:  Goal status: INITIAL  2.  Pt will report improved pain by >/=50% Baseline:  Goal status: INITIAL  3.  Pt will have improved TUG to </=30 sec with rollator Baseline: 36.63 sec 08/02/24 Goal status: INITIAL  4.  Pt will have improved 5x STS to </=25 sec to  demo increased functional LE Baseline:  Goal status: INITIAL  5.  Pt will have improved modified Oswestry to </=  12/50 to demo MCID Baseline: 24.8 Goal status: INITIAL    PLAN:  PT FREQUENCY: 2x/week  PT DURATION: 8 weeks  PLANNED INTERVENTIONS: [id number]- PT Re-evaluation, 97110-Therapeutic exercises, 97530- Therapeutic activity, 97112- Neuromuscular re-education, 97535- Self Care, [id number]- Manual therapy, [phone]- Gait training, [phone]- Aquatic Therapy, [phone]- Electrical stimulation (unattended), [id number]- Ultrasound, [phone]- Ionotophoresis 4mg /ml Dexamethasone , 79439 (1-2 muscles), [id number] (3+ muscles)- Dry Needling, Patient/Family education, Balance training, Taping, Joint mobilization, Cryotherapy, and Moist heat.  PLAN FOR NEXT SESSION: Initiate core strengthening and general LE conditioning/mobility. Decrease back spasm/pain with manual/modalities as indicated.    Marko Molt, PT, DPT  09/29/2024 12:33 PM

## 2024-09-30 ENCOUNTER — Other Ambulatory Visit: Payer: Self-pay | Admitting: Family Medicine

## 2024-09-30 ENCOUNTER — Other Ambulatory Visit (HOSPITAL_COMMUNITY): Payer: Self-pay

## 2024-09-30 ENCOUNTER — Other Ambulatory Visit: Payer: Self-pay

## 2024-09-30 ENCOUNTER — Other Ambulatory Visit: Payer: Self-pay | Admitting: Cardiology

## 2024-09-30 DIAGNOSIS — F419 Anxiety disorder, unspecified: Secondary | ICD-10-CM

## 2024-09-30 MED ORDER — ALPRAZOLAM 0.5 MG PO TABS
0.2500 mg | ORAL_TABLET | Freq: Every evening | ORAL | 3 refills | Status: AC | PRN
  Filled 2024-09-30: qty 30, 30d supply, fill #0
  Filled 2024-11-15: qty 30, 30d supply, fill #1

## 2024-10-03 ENCOUNTER — Other Ambulatory Visit (HOSPITAL_COMMUNITY): Payer: Self-pay

## 2024-10-03 ENCOUNTER — Other Ambulatory Visit

## 2024-10-04 ENCOUNTER — Ambulatory Visit

## 2024-10-04 ENCOUNTER — Telehealth: Payer: Self-pay | Admitting: Physical Medicine & Rehabilitation

## 2024-10-04 DIAGNOSIS — M5459 Other low back pain: Secondary | ICD-10-CM | POA: Diagnosis not present

## 2024-10-04 DIAGNOSIS — R2689 Other abnormalities of gait and mobility: Secondary | ICD-10-CM

## 2024-10-04 DIAGNOSIS — M6281 Muscle weakness (generalized): Secondary | ICD-10-CM

## 2024-10-04 NOTE — Therapy (Signed)
 OUTPATIENT PHYSICAL THERAPY NOTE   Patient Name:  [name] MRN: [id number] DOB:[birth year], 63 y.o., female Today's Date: 10/04/2024  END OF SESSION:  PT End of Session - 10/04/24 0945     Visit Number 5    Number of Visits 36    Date for Recertification  11/04/24    Authorization Type [employer]    Authorization Time Period no auth reqd    PT Start Time 0955    PT Stop Time 1035    PT Time Calculation (min) 40 min    Activity Tolerance Patient limited by pain    Behavior During Therapy [facility] for tasks assessed/performed               Past Medical History:  Diagnosis Date   Anxiety    Aortic atherosclerosis 04/16/2021   Atypical angina    Back pain    related to spinal stenosis and disc problem, radiates down left buttocks to leg., weakness occ.   Chest pain    a. 03/2015 Cath: nl cors; b. 03/2021 Cor CTA: Ca2+ = 0. Nl Cors.   Chronic pain syndrome    Dyspnea    hx   GERD (gastroesophageal reflux disease)    Grade I diastolic dysfunction    Headache    Hyperlipidemia    Hypertension    Lumbar post-laminectomy syndrome    LVH (left ventricular hypertrophy) 12/15/2020   a. 11/2020 Echo: EF 65-70%, no rwma, sev asymm LVH with IVSd 1.9 cm. No LVOT obs @ rest. Gr1 DD. Triv MR.   PONV (postoperative nausea and vomiting)    Pulmonary nodules    a. 03/2021 CT Chest: 3mm pulm nodules in bilat lower lobes. F/u 1 yr.   Right foot drop    Syncope    a. 11/2020 Zio: No significant arrhythmias.   Vaginal foreign object    Uses Femring    Past Surgical History:  Procedure Laterality Date   ABDOMINAL HYSTERECTOMY     ANTERIOR CERVICAL DECOMP/DISCECTOMY FUSION N/A 01/03/2022   Procedure: Anterior Cervical Decompression Fusion - Cervical four-Cervical five - Cervical five-Cervical six;  Surgeon: Joshua Alm RAMAN, MD;  Location: [facility] OR;  Service: Neurosurgery;  Laterality: N/A;   BIOPSY  12/16/2020   Procedure: BIOPSY;  Surgeon: Aneita Gwendlyn DASEN, MD;  Location:  [facility] ENDOSCOPY;  Service: Endoscopy;;   CARDIAC CATHETERIZATION N/A 04/18/2015   Procedure: Left Heart Cath and Coronary Angiography;  Surgeon: Rober Chroman, MD;  Location: [facility] INVASIVE CV LAB;  Service: Cardiovascular;  Laterality: N/A;   COLONOSCOPY     COLONOSCOPY W/ BIOPSIES AND POLYPECTOMY  2018   ESOPHAGOGASTRODUODENOSCOPY (EGD) WITH PROPOFOL  N/A 12/16/2020   Procedure: ESOPHAGOGASTRODUODENOSCOPY (EGD) WITH PROPOFOL ;  Surgeon: Aneita Gwendlyn DASEN, MD;  Location: [facility] ENDOSCOPY;  Service: Endoscopy;  Laterality: N/A;   FOOT SURGERY Bilateral    [facility] bunion,bone spur, tendon (1) -6'16, (1)-10'16   HEMATOMA EVACUATION N/A 01/05/2022   Procedure: Cervical Wound Exploration;  Surgeon: Gillie Duncans, MD;  Location: [facility] OR;  Service: Neurosurgery;  Laterality: N/A;   IR EPIDUROGRAPHY  07/21/2018   LUMBAR LAMINECTOMY/DECOMPRESSION MICRODISCECTOMY Bilateral 12/28/2015   Procedure: MICRO LUMBAR DECOMPRESSION L4 - L5 BILATERALLY;  Surgeon: Reyes Billing, MD;  Location: [facility] ORS;  Service: Orthopedics;  Laterality: Bilateral;   LUMBAR LAMINECTOMY/DECOMPRESSION MICRODISCECTOMY Bilateral 03/04/2018   Procedure: Revision of Microlumbar Decompression Bilateral Lumbar Four-Five;  Surgeon: Billing Reyes, MD;  Location: [facility] OR;  Service: Orthopedics;  Laterality: Bilateral;  90 mins   LUMBAR WOUND DEBRIDEMENT N/A  12/25/2023   Procedure: Irrigation and debridement of lumbar wound;  Surgeon: Joshua Alm Hamilton, MD;  Location: [facility] OR;  Service: Neurosurgery;  Laterality: N/A;   POSTERIOR FUSION PEDICLE SCREW PLACEMENT N/A 12/07/2023   Procedure: LUMBAR FOUR-FIVE POSTERIOR LATERAL FUSION;  Surgeon: Joshua Alm Hamilton, MD;  Location: [facility] OR;  Service: Neurosurgery;  Laterality: N/A;   SAVORY DILATION N/A 12/16/2020   Procedure: SAVORY DILATION;  Surgeon: Aneita Gwendlyn DASEN, MD;  Location: [facility] ENDOSCOPY;  Service: Endoscopy;  Laterality: N/A;   SPINAL CORD STIMULATOR INSERTION N/A 09/28/2019   Procedure: THORACIC  SPINAL CORD STIMULATOR INSERTION;  Surgeon: Burnetta Aures, MD;  Location: [facility] OR;  Service: Orthopedics;  Laterality: N/A;  2.5 hrs   SPINAL CORD STIMULATOR REMOVAL N/A 05/27/2021   Procedure: LUMBAR SPINAL CORD STIMULATOR REMOVAL;  Surgeon: Bluford Standing, MD;  Location: [facility] ORS;  Service: Neurosurgery;  Laterality: N/A;   TUBAL LIGATION     WISDOM TOOTH EXTRACTION     WOUND EXPLORATION N/A 03/04/2018   Procedure: EXPLORATION OF LUMBAR DECOMPRESSION WOUND;  Surgeon: Duwayne Purchase, MD;  Location: [facility] OR;  Service: Orthopedics;  Laterality: N/A;   Patient Active Problem List   Diagnosis Date Noted   Vitamin D  deficiency, unspecified 04/13/2024   Seasonal allergic rhinitis due to pollen 01/29/2024   Delayed wound healing 12/25/2023   S/P lumbar fusion 12/07/2023   Pain in thumb joint with movement of right hand 08/12/2023   Localized primary osteoarthritis of carpometacarpal joint of right thumb 07/29/2023   Right foot drop 07/29/2023   Frequent falls 04/14/2023   Gastroesophageal reflux disease 04/14/2023   Right upper extremity numbness 09/29/2022   Stage 3a chronic kidney disease (HCC) 09/29/2022   Cervical spondylosis with radiculopathy 01/05/2022   S/P cervical spinal fusion 01/03/2022   Cervical disc disorder with radiculopathy of cervical region 11/27/2021   Uncontrolled pain 05/27/2021   Syncope 05/13/2021   Depression with anxiety 05/13/2021   Chronic diastolic CHF (congestive heart failure) (HCC) 05/13/2021   Chronic back pain 05/13/2021   Chest tightness 05/13/2021   Lower abdominal pain 05/13/2021   Syncope and collapse 01/01/2021   Abnormal echocardiogram 01/01/2021   BPPV (benign paroxysmal positional vertigo), left 12/26/2020   Odynophagia    Dysphagia 12/15/2020   Postural dizziness with presyncope 12/14/2020   Lumbar post-laminectomy syndrome    Chronic pain 09/28/2019   Lumbar spinal stenosis    Radicular pain    Gait disorder    Difficulty in urination  07/13/2018   Back pain 07/12/2018   Hyperlipidemia 04/16/2018   GAD (generalized anxiety disorder) 04/16/2018   AKI (acute kidney injury)    Benign essential HTN    Major depression, recurrent    Lumbar radiculopathy 03/09/2018   Paraparesis (HCC) 03/09/2018   Essential hypertension    Chronic nonintractable headache    Leukocytosis    Acute blood loss anemia    Postoperative pain    Generalized weakness    Spinal stenosis at L4-L5 level 03/04/2018   Spinal stenosis of lumbar region 12/28/2015   Chest pain 04/16/2015    PCP: Jordan, [name], MD  REFERRING PROVIDER: Joshua Alm Hamilton, MD  REFERRING DIAG: M43.16 (ICD-10-CM) - Spondylolisthesis, lumbar region  Rationale for Evaluation and Treatment: Rehabilitation  THERAPY DIAG:  Other low back pain  Muscle weakness (generalized)  Other abnormalities of gait and mobility  ONSET DATE: Spinal fusion in Feb, 06/17/24 hospital admission for COVID-19  SUBJECTIVE:  SUBJECTIVE STATEMENT: 10/04/2024 Patient reports she is in more pain due to the cold weather. She notes the PT is helpful. Pt agrees to d/c next session.  EVAL: Reports she was doing well in PT with Elvie at neuro s/p spinal fusion. And then pt states she was hospitalized with COVID in August. Nursing staff wanted to get her O2 with activity but she fell. Was still recovering from spine fusion at the time. Back became inflamed and had more pain. Was recommended to do dry needling but pt states she is unable to afford the out of pocket payment. D/C-ed from PT from neuro clinic at the time and had R carpal tunnel surgery. Dr recommended for her to try the ortho clinic instead. Lost all of her strength after her fall in the hospital. Normally uses forearm crutches in the house. Keeps walker  close by. Was recommended to use ice vs any pain patches. Has been doing her HEP in bed from when she saw Elvie.   PERTINENT HISTORY:  R drop foot (has AFO), Feb 2025 spinal fusion (L4-5) and then I&D, R CTS  PAIN:  Are you having pain? Yes: NPRS scale: 6-7 currently, at best 4, at worst 9 Pain location: Low back and radiates to sides and down to sacrum Pain description: Burning Aggravating factors: bending, lifting, twisting; standing long periods Relieving factors: Exercises, ice  PRECAUTIONS: Fall, Back  RED FLAGS: None   WEIGHT BEARING RESTRICTIONS: No  FALLS:  Has patient fallen in last 6 months? Yes. Number of falls 7  LIVING ENVIRONMENT: Lives with: lives with their spouse Lives in: House/apartment Stairs: No Has following equipment at home: Environmental Consultant - 4 wheeled and Crutches  OCCUPATION: Retired  PLOF: Independent  PATIENT GOALS: Improve strength/pain before her beach trip in December  NEXT MD VISIT: Dr. Joshua in December  OBJECTIVE:  Note: Objective measures were completed at Evaluation unless otherwise noted.  DIAGNOSTIC FINDINGS:  Lumbar CT 06/17/24 IMPRESSION: 1. No acute osseous abnormality in the Lumbar Spine. 2. L4-L5 decompression and fusion with no adverse hardware features. And lumbar spine degeneration elsewhere appears stable from march MRI, with multifactorial lumbar spinal stenosis at L2-L3 and L3-L4. 3.  Aortic Atherosclerosis (ICD10-I70.0).  PATIENT SURVEYS:  Modified Oswestry:  MODIFIED OSWESTRY DISABILITY SCALE  Date: 09/09/24 Score  Pain intensity 4 =  Pain medication provides me with little relief from pain.  2. Personal care (washing, dressing, etc.) 1 =  I can take care of myself normally, but it increases my pain.  3. Lifting 3 = Pain prevents me from lifting heavy weights, but I can manage light to medium weights if they are conveniently positioned  4. Walking 4 = I can only walk with crutches or a cane.  5. Sitting 2 =  Pain  prevents me from sitting more than 1 hour.  6. Standing 2 =  Pain prevents me from standing more than 1 hour  7. Sleeping 1 = I can sleep well only by using pain medication.  8. Social Life 2 = Pain prevents me from participating in more energetic activities (eg. sports, dancing).  9. Traveling 2 =  My pain restricts my travel over 2 hours.  10. Employment/ Homemaking 3 = Pain prevents me from doing anything but light duties.  Total 24/50   Interpretation of scores: Score Category Description  0-20% Minimal Disability The patient can cope with most living activities. Usually no treatment is indicated apart from advice on lifting, sitting and exercise  21-40% Moderate  Disability The patient experiences more pain and difficulty with sitting, lifting and standing. Travel and social life are more difficult and they may be disabled from work. Personal care, sexual activity and sleeping are not grossly affected, and the patient can usually be managed by conservative means  41-60% Severe Disability Pain remains the main problem in this group, but activities of daily living are affected. These patients require a detailed investigation  61-80% Crippled Back pain impinges on all aspects of the patient's life. Positive intervention is required  81-100% Bed-bound These patients are either bed-bound or exaggerating their symptoms  Bluford FORBES Zoe DELENA Karon DELENA, et al. Surgery versus conservative management of stable thoracolumbar fracture: the PRESTO feasibility RCT. [place] ([place]): [employer]; 2021 Nov. [facility] Technology Assessment, No. 25.62.) Appendix 3, Oswestry Disability Index category descriptors. Available from: Findjewelers.cz  Minimally Clinically Important Difference (MCID) = 12.8%  COGNITION: Overall cognitive status: Within functional limits for tasks assessed     SENSATION: WFL  MUSCLE LENGTH: Hamstrings: Right ~45 deg; Left ~45 deg pulls in  back  POSTURE: No Significant postural limitations  PALPATION: Highly tender to gentle palpation bilat lumbar paraspinals, QL, upper glutes, R>L glute max  LUMBAR ROM: Did not assess  AROM eval  Flexion   Extension   Right lateral flexion   Left lateral flexion   Right rotation   Left rotation    (Blank rows = not tested)  LOWER EXTREMITY ROM: limited R>L hip ER, IR and abd in supine. Knee ROM assessment limited due to pt's R AFO  LOWER EXTREMITY MMT:     Active  Right eval Left eval  Hip flexion 3- 3+  Hip extension 3- 3-  Hip abduction 3 3+  Hip adduction    Hip internal rotation    Hip external rotation    Knee flexion 3+ 3+  Knee extension 3- p! In back 3  Ankle dorsiflexion    Ankle plantarflexion    Ankle inversion    Ankle eversion     (Blank rows = not tested)   LUMBAR SPECIAL TESTS:  Straight leg raise test: Positive and FABER test: Positive  FUNCTIONAL TESTS:  5 times sit to stand: TBA  GAIT: Distance walked: Into clinic Assistive device utilized: Rollator Level of assistance: SBA Comments: R foot in AFO, R LE slightly externally rotated, reciprocal pattern  TREATMENT DATE:   [facility] Adult PT Treatment:                                                DATE: 09/29/2024  Neuromuscular Re-education  Seated pelvic tilt on dynadisc, 3 x 15 reps  Seated marches on dynadisc, 2 x 10 reps   Modalities: E-stim IFC lumbar paraspinals with ice under back x 12 min, intensity to pt tolerance 7.5v  [facility] Adult PT Treatment:                                                DATE: 09/27/2024  Therapeutic Exercise:  Seated pelvic tilt x 10 reps Seated pelvic tilt on dynadisc, 2 x 15 reps   Self-Care:  Patient education concurrent with e-stim application  Follow up regarding lifestyle factors and self-management techniques to address chroniciity and severity  of pain symptoms. Including monitoring nutrition, hydration and stress management Reinforced goal of OP PT  and gradual reintroduction of active interventions at future visits  Discussion regarding normal psychological impact of chronic and severe pain; encouraged patient to acknowledge the emotions and recognize that normalcy of this experience/impact   Modalities: E-stim IFC lumbar paraspinals with ice under back x 15 min, intensity to pt tolerance 9.3v     PATIENT EDUCATION:  Education details: Exam findings, POC, TENs Person educated: Patient Education method: Explanation, Demonstration, and Handouts Education comprehension: verbalized understanding, returned demonstration, and needs further education  HOME EXERCISE PROGRAM: Access Code: [email or web address] URL: [email or web address] Date: 09/20/2024 Prepared by: Marko Molt  Exercises - Seated Diaphragmatic Breathing  - 2 x daily - 7 x weekly - 5 reps  Patient Education - Ice  ASSESSMENT:  CLINICAL IMPRESSION: 10/04/2024 [name] tolerable to exercises today with decrease in pain by end of session. She continues to see benefit from [facility]. Pt agrees to d/c next session to return to referring provider.   Patient is a 63 y.o. F who was seen today for physical therapy evaluation and treatment for low back pain. PMH is significant for L4-5 spinal fusion in Feb 2025, chronic R foot drop (with AFO), recent carpal tunnel surgery, COVID with recent hospitalization, and history of multiple falls. Assessment is significant for gross bilat LE weakness, increased lumbar paraspinal and glute spasm, reduced hip and hamstring flexibility affecting tolerance to prolonged unsupported sitting and standing tasks. Pain limits/inhibits pt's movements. Has deferred dry needling due to financial cost. States Dr. Molt would like her to be seen here at the ortho clinic to see if we can help with this flare up. Pt will benefit from PT to address these issues and improve home/community safety.   OBJECTIVE IMPAIRMENTS: Abnormal gait, decreased activity  tolerance, decreased balance, decreased coordination, decreased endurance, decreased mobility, difficulty walking, decreased ROM, decreased strength, increased edema, increased fascial restrictions, increased muscle spasms, impaired flexibility, improper body mechanics, postural dysfunction, and pain.   ACTIVITY LIMITATIONS: carrying, lifting, bending, sitting, standing, squatting, stairs, transfers, bed mobility, hygiene/grooming, and locomotion level  PARTICIPATION LIMITATIONS: meal prep, cleaning, laundry, driving, shopping, and community activity  PERSONAL FACTORS: Age, Fitness, Past/current experiences, and Time since onset of injury/illness/exacerbation are also affecting patient's functional outcome.   REHAB POTENTIAL: Good  CLINICAL DECISION MAKING: Unstable/unpredictable  EVALUATION COMPLEXITY: High   GOALS: Goals reviewed with patient? Yes  SHORT TERM GOALS: Target date: 10/07/2024   Pt will be ind with initial HEP Baseline: Goal status: INITIAL  2.  Pt will be able to perform 5x STS in </=30 sec Baseline: From prior PT note 42.97 sec 06/28/24 Goal status: INITIAL   LONG TERM GOALS: Target date: 11/04/2024   Pt will be ind with management and progression of HEP Baseline:  Goal status: INITIAL  2.  Pt will report improved pain by >/=50% Baseline:  Goal status: INITIAL  3.  Pt will have improved TUG to </=30 sec with rollator Baseline: 36.63 sec 08/02/24 Goal status: INITIAL  4.  Pt will have improved 5x STS to </=25 sec to demo increased functional LE Baseline:  Goal status: INITIAL  5.  Pt will have improved modified Oswestry to </=12/50 to demo MCID Baseline: 24.8 Goal status: INITIAL    PLAN:  PT FREQUENCY: 2x/week  PT DURATION: 8 weeks  PLANNED INTERVENTIONS: 97164- PT Re-evaluation, 97110-Therapeutic exercises, 97530- Therapeutic activity, [id number]- Neuromuscular re-education, 97535- Self Care, [id number]- Manual therapy, [id number]- Gait training, [phone]-  Aquatic  Therapy, G0283- Electrical stimulation (unattended), [id number]- Ultrasound, 02966- Ionotophoresis 4mg /ml Dexamethasone , 79439 (1-2 muscles), 20561 (3+ muscles)- Dry Needling, Patient/Family education, Balance training, Taping, Joint mobilization, Cryotherapy, and Moist heat.  PLAN FOR NEXT SESSION: Initiate core strengthening and general LE conditioning/mobility. Decrease back spasm/pain with manual/modalities as indicated.    Marijo Berber PT, DPT  10/04/2024 1:11 PM

## 2024-10-04 NOTE — Telephone Encounter (Signed)
 P called and stated she is doing dry needling at physical therapy. She said she spoke with you last time about if it's not helping her she should call the office and you could do something else or a back injection. She is wanting to know what she needs to do? She has a upcoming follow up with you 1/21 should I change this to a back injection?

## 2024-10-05 ENCOUNTER — Other Ambulatory Visit: Payer: Self-pay

## 2024-10-05 ENCOUNTER — Telehealth: Payer: Self-pay | Admitting: Physical Medicine & Rehabilitation

## 2024-10-05 ENCOUNTER — Other Ambulatory Visit (HOSPITAL_COMMUNITY): Payer: Self-pay

## 2024-10-05 MED ORDER — HYDRALAZINE HCL 50 MG PO TABS
50.0000 mg | ORAL_TABLET | Freq: Two times a day (BID) | ORAL | 0 refills | Status: AC
  Filled 2024-10-05: qty 180, 90d supply, fill #0

## 2024-10-05 NOTE — Telephone Encounter (Signed)
 Pt called and wanted to know if you received the note from physical therapy, and she didn't hear back from you. She ask if you can give her a call

## 2024-10-06 ENCOUNTER — Ambulatory Visit

## 2024-10-06 ENCOUNTER — Other Ambulatory Visit (HOSPITAL_COMMUNITY): Payer: Self-pay

## 2024-10-06 DIAGNOSIS — M6281 Muscle weakness (generalized): Secondary | ICD-10-CM

## 2024-10-06 DIAGNOSIS — R2681 Unsteadiness on feet: Secondary | ICD-10-CM

## 2024-10-06 DIAGNOSIS — M5459 Other low back pain: Secondary | ICD-10-CM

## 2024-10-06 DIAGNOSIS — R2689 Other abnormalities of gait and mobility: Secondary | ICD-10-CM

## 2024-10-06 NOTE — Therapy (Signed)
 OUTPATIENT PHYSICAL THERAPY NOTE DISCHARGE SUMMARY   Patient Name: [name] MRN: [id number] DOB:1960-10-30, 63 y.o., female Today's Date: 10/06/2024  PHYSICAL THERAPY DISCHARGE SUMMARY  Visits from Start of Care: 6   Current functional level related to goals / functional outcomes: See objective findings/assessment  Remaining deficits: See objective findings/assessment    Education / Equipment: See today's treatment/assessment      Patient agrees to discharge. Patient goals were not met. Patient is being discharged due to the patient's request. She plans to return to MD for f/u and possible injection.     END OF SESSION:  PT End of Session - 10/06/24 1043     Visit Number 6    Number of Visits 36    Date for Recertification  11/04/24    Authorization Type [employer]    Authorization Time Period no auth reqd    Progress Note Due on Visit 30    PT Start Time 1045    PT Stop Time 1123    PT Time Calculation (min) 38 min    Activity Tolerance Patient limited by pain    Behavior During Therapy [facility] for tasks assessed/performed                Past Medical History:  Diagnosis Date   Anxiety    Aortic atherosclerosis 04/16/2021   Atypical angina    Back pain    related to spinal stenosis and disc problem, radiates down left buttocks to leg., weakness occ.   Chest pain    a. 03/2015 Cath: nl cors; b. 03/2021 Cor CTA: Ca2+ = 0. Nl Cors.   Chronic pain syndrome    Dyspnea    hx   GERD (gastroesophageal reflux disease)    Grade I diastolic dysfunction    Headache    Hyperlipidemia    Hypertension    Lumbar post-laminectomy syndrome    LVH (left ventricular hypertrophy) 12/15/2020   a. 11/2020 Echo: EF 65-70%, no rwma, sev asymm LVH with IVSd 1.9 cm. No LVOT obs @ rest. Gr1 DD. Triv MR.   PONV (postoperative nausea and vomiting)    Pulmonary nodules    a. 03/2021 CT Chest: 3mm pulm nodules in bilat lower lobes. F/u 1 yr.   Right foot drop     Syncope    a. 11/2020 Zio: No significant arrhythmias.   Vaginal foreign object    Uses Femring    Past Surgical History:  Procedure Laterality Date   ABDOMINAL HYSTERECTOMY     ANTERIOR CERVICAL DECOMP/DISCECTOMY FUSION N/A 01/03/2022   Procedure: Anterior Cervical Decompression Fusion - Cervical four-Cervical five - Cervical five-Cervical six;  Surgeon: Joshua Alm RAMAN, MD;  Location: [facility] OR;  Service: Neurosurgery;  Laterality: N/A;   BIOPSY  12/16/2020   Procedure: BIOPSY;  Surgeon: Aneita Gwendlyn DASEN, MD;  Location: [facility] ENDOSCOPY;  Service: Endoscopy;;   CARDIAC CATHETERIZATION N/A 04/18/2015   Procedure: Left Heart Cath and Coronary Angiography;  Surgeon: Rober Chroman, MD;  Location: [facility] INVASIVE CV LAB;  Service: Cardiovascular;  Laterality: N/A;   COLONOSCOPY     COLONOSCOPY W/ BIOPSIES AND POLYPECTOMY  2018   ESOPHAGOGASTRODUODENOSCOPY (EGD) WITH PROPOFOL  N/A 12/16/2020   Procedure: ESOPHAGOGASTRODUODENOSCOPY (EGD) WITH PROPOFOL ;  Surgeon: Aneita Gwendlyn DASEN, MD;  Location: [facility] ENDOSCOPY;  Service: Endoscopy;  Laterality: N/A;   FOOT SURGERY Bilateral    [facility] bunion,bone spur, tendon (1) -6'16, (1)-10'16   HEMATOMA EVACUATION N/A 01/05/2022   Procedure: Cervical Wound Exploration;  Surgeon: Gillie Duncans,  MD;  Location: [facility] OR;  Service: Neurosurgery;  Laterality: N/A;   IR EPIDUROGRAPHY  07/21/2018   LUMBAR LAMINECTOMY/DECOMPRESSION MICRODISCECTOMY Bilateral 12/28/2015   Procedure: MICRO LUMBAR DECOMPRESSION L4 - L5 BILATERALLY;  Surgeon: Reyes Billing, MD;  Location: [facility] ORS;  Service: Orthopedics;  Laterality: Bilateral;   LUMBAR LAMINECTOMY/DECOMPRESSION MICRODISCECTOMY Bilateral 03/04/2018   Procedure: Revision of Microlumbar Decompression Bilateral Lumbar Four-Five;  Surgeon: Billing Reyes, MD;  Location: [facility] OR;  Service: Orthopedics;  Laterality: Bilateral;  90 mins   LUMBAR WOUND DEBRIDEMENT N/A 12/25/2023   Procedure: Irrigation and debridement of lumbar wound;   Surgeon: Joshua Alm Hamilton, MD;  Location: [facility] OR;  Service: Neurosurgery;  Laterality: N/A;   POSTERIOR FUSION PEDICLE SCREW PLACEMENT N/A 12/07/2023   Procedure: LUMBAR FOUR-FIVE POSTERIOR LATERAL FUSION;  Surgeon: Joshua Alm Hamilton, MD;  Location: [facility] OR;  Service: Neurosurgery;  Laterality: N/A;   SAVORY DILATION N/A 12/16/2020   Procedure: SAVORY DILATION;  Surgeon: Aneita Gwendlyn DASEN, MD;  Location: [facility] ENDOSCOPY;  Service: Endoscopy;  Laterality: N/A;   SPINAL CORD STIMULATOR INSERTION N/A 09/28/2019   Procedure: THORACIC SPINAL CORD STIMULATOR INSERTION;  Surgeon: Burnetta Aures, MD;  Location: [facility] OR;  Service: Orthopedics;  Laterality: N/A;  2.5 hrs   SPINAL CORD STIMULATOR REMOVAL N/A 05/27/2021   Procedure: LUMBAR SPINAL CORD STIMULATOR REMOVAL;  Surgeon: Bluford Standing, MD;  Location: [facility] ORS;  Service: Neurosurgery;  Laterality: N/A;   TUBAL LIGATION     WISDOM TOOTH EXTRACTION     WOUND EXPLORATION N/A 03/04/2018   Procedure: EXPLORATION OF LUMBAR DECOMPRESSION WOUND;  Surgeon: Billing Reyes, MD;  Location: [facility] OR;  Service: Orthopedics;  Laterality: N/A;   Patient Active Problem List   Diagnosis Date Noted   Vitamin D  deficiency, unspecified 04/13/2024   Seasonal allergic rhinitis due to pollen 01/29/2024   Delayed wound healing 12/25/2023   S/P lumbar fusion 12/07/2023   Pain in thumb joint with movement of right hand 08/12/2023   Localized primary osteoarthritis of carpometacarpal joint of right thumb 07/29/2023   Right foot drop 07/29/2023   Frequent falls 04/14/2023   Gastroesophageal reflux disease 04/14/2023   Right upper extremity numbness 09/29/2022   Stage 3a chronic kidney disease (HCC) 09/29/2022   Cervical spondylosis with radiculopathy 01/05/2022   S/P cervical spinal fusion 01/03/2022   Cervical disc disorder with radiculopathy of cervical region 11/27/2021   Uncontrolled pain 05/27/2021   Syncope 05/13/2021   Depression with anxiety 05/13/2021   Chronic  diastolic CHF (congestive heart failure) (HCC) 05/13/2021   Chronic back pain 05/13/2021   Chest tightness 05/13/2021   Lower abdominal pain 05/13/2021   Syncope and collapse 01/01/2021   Abnormal echocardiogram 01/01/2021   BPPV (benign paroxysmal positional vertigo), left 12/26/2020   Odynophagia    Dysphagia 12/15/2020   Postural dizziness with presyncope 12/14/2020   Lumbar post-laminectomy syndrome    Chronic pain 09/28/2019   Lumbar spinal stenosis    Radicular pain    Gait disorder    Difficulty in urination 07/13/2018   Back pain 07/12/2018   Hyperlipidemia 04/16/2018   GAD (generalized anxiety disorder) 04/16/2018   AKI (acute kidney injury)    Benign essential HTN    Major depression, recurrent    Lumbar radiculopathy 03/09/2018   Paraparesis (HCC) 03/09/2018   Essential hypertension    Chronic nonintractable headache    Leukocytosis    Acute blood loss anemia    Postoperative pain    Generalized weakness    Spinal stenosis at L4-L5 level  03/04/2018   Spinal stenosis of lumbar region 12/28/2015   Chest pain 04/16/2015    PCP: Jordan, [name], MD  REFERRING PROVIDER: Joshua Alm Hamilton, MD  REFERRING DIAG: [phone] (ICD-10-CM) - Spondylolisthesis, lumbar region  Rationale for Evaluation and Treatment: Rehabilitation  THERAPY DIAG:  Other low back pain  Muscle weakness (generalized)  Other abnormalities of gait and mobility  Unsteadiness on feet  ONSET DATE: Spinal fusion in Feb, 06/17/24 hospital admission for COVID-19  SUBJECTIVE:                                                                                                                                                                                           SUBJECTIVE STATEMENT: 10/06/2024 Patient reports she is in more pain due to the cold weather. She plans to get her trigger pint injections. She would like to be d/c today. She states that she is starting to have more trembling on her R LE with  standing. She plans to f/u with MD about this.   EVAL: Reports she was doing well in PT with Elvie at neuro s/p spinal fusion. And then pt states she was hospitalized with COVID in August. Nursing staff wanted to get her O2 with activity but she fell. Was still recovering from spine fusion at the time. Back became inflamed and had more pain. Was recommended to do dry needling but pt states she is unable to afford the out of pocket payment. D/C-ed from PT from neuro clinic at the time and had R carpal tunnel surgery. Dr recommended for her to try the ortho clinic instead. Lost all of her strength after her fall in the hospital. Normally uses forearm crutches in the house. Keeps walker close by. Was recommended to use ice vs any pain patches. Has been doing her HEP in bed from when she saw Elvie.   PERTINENT HISTORY:  R drop foot (has AFO), Feb 2025 spinal fusion (L4-5) and then I&D, R CTS  PAIN:  Are you having pain? Yes: NPRS scale: 6-7 currently, at best 4, at worst 9 Pain location: Low back and radiates to sides and down to sacrum Pain description: Burning Aggravating factors: bending, lifting, twisting; standing long periods Relieving factors: Exercises, ice  PRECAUTIONS: Fall, Back  RED FLAGS: None   WEIGHT BEARING RESTRICTIONS: No  FALLS:  Has patient fallen in last 6 months? Yes. Number of falls 7  LIVING ENVIRONMENT: Lives with: lives with their spouse Lives in: House/apartment Stairs: No Has following equipment at home: Environmental Consultant - 4 wheeled and Crutches  OCCUPATION: Retired  PLOF: Independent  PATIENT GOALS: Improve strength/pain before her beach trip  in December  NEXT MD VISIT: Dr. Joshua in December  OBJECTIVE:  Note: Objective measures were completed at Evaluation unless otherwise noted.  DIAGNOSTIC FINDINGS:  Lumbar CT 06/17/24 IMPRESSION: 1. No acute osseous abnormality in the Lumbar Spine. 2. L4-L5 decompression and fusion with no adverse hardware  features. And lumbar spine degeneration elsewhere appears stable from march MRI, with multifactorial lumbar spinal stenosis at L2-L3 and L3-L4. 3.  Aortic Atherosclerosis (ICD10-I70.0).  PATIENT SURVEYS:  Modified Oswestry:  MODIFIED OSWESTRY DISABILITY SCALE  Date: 09/09/24 Score  Pain intensity 4 =  Pain medication provides me with little relief from pain.  2. Personal care (washing, dressing, etc.) 1 =  I can take care of myself normally, but it increases my pain.  3. Lifting 3 = Pain prevents me from lifting heavy weights, but I can manage light to medium weights if they are conveniently positioned  4. Walking 4 = I can only walk with crutches or a cane.  5. Sitting 2 =  Pain prevents me from sitting more than 1 hour.  6. Standing 2 =  Pain prevents me from standing more than 1 hour  7. Sleeping 1 = I can sleep well only by using pain medication.  8. Social Life 2 = Pain prevents me from participating in more energetic activities (eg. sports, dancing).  9. Traveling 2 =  My pain restricts my travel over 2 hours.  10. Employment/ Homemaking 3 = Pain prevents me from doing anything but light duties.  Total 24/50   Interpretation of scores: Score Category Description  0-20% Minimal Disability The patient can cope with most living activities. Usually no treatment is indicated apart from advice on lifting, sitting and exercise  21-40% Moderate Disability The patient experiences more pain and difficulty with sitting, lifting and standing. Travel and social life are more difficult and they may be disabled from work. Personal care, sexual activity and sleeping are not grossly affected, and the patient can usually be managed by conservative means  41-60% Severe Disability Pain remains the main problem in this group, but activities of daily living are affected. These patients require a detailed investigation  61-80% Crippled Back pain impinges on all aspects of the patients life. Positive  intervention is required  81-100% Bed-bound These patients are either bed-bound or exaggerating their symptoms  Bluford FORBES Zoe DELENA Karon DELENA, et al. Surgery versus conservative management of stable thoracolumbar fracture: the PRESTO feasibility RCT. [place] ([place]): [employer]; 2021 Nov. [facility] Technology Assessment, No. 25.62.) Appendix 3, Oswestry Disability Index category descriptors. Available from: Findjewelers.cz  Minimally Clinically Important Difference (MCID) = 12.8%  COGNITION: Overall cognitive status: Within functional limits for tasks assessed     SENSATION: WFL  MUSCLE LENGTH: Hamstrings: Right ~45 deg; Left ~45 deg pulls in back  POSTURE: No Significant postural limitations  PALPATION: Highly tender to gentle palpation bilat lumbar paraspinals, QL, upper glutes, R>L glute max  LUMBAR ROM: Did not assess  AROM eval  Flexion   Extension   Right lateral flexion   Left lateral flexion   Right rotation   Left rotation    (Blank rows = not tested)  LOWER EXTREMITY ROM: limited R>L hip ER, IR and abd in supine. Knee ROM assessment limited due to pt's R AFO  LOWER EXTREMITY MMT:     Active  Right eval Left eval  Hip flexion 3- 3+  Hip extension 3- 3-  Hip abduction 3 3+  Hip adduction    Hip internal  rotation    Hip external rotation    Knee flexion 3+ 3+  Knee extension 3- p! In back 3  Ankle dorsiflexion    Ankle plantarflexion    Ankle inversion    Ankle eversion     (Blank rows = not tested)   LUMBAR SPECIAL TESTS:  Straight leg raise test: Positive and FABER test: Positive  FUNCTIONAL TESTS:  5 times sit to stand: TBA  GAIT: Distance walked: Into clinic Assistive device utilized: Rollator Level of assistance: SBA Comments: R foot in AFO, R LE slightly externally rotated, reciprocal pattern  TREATMENT DATE:    [facility] Adult PT Treatment:                                                DATE:  10/06/2024  Neuromuscular Re-education  Seated pelvic tilt on dynadisc, 3 x 15 reps  Seated marches on dynadisc, 2 x 10 reps   Modalities: E-stim IFC lumbar paraspinals with ice under back x 15 min, intensity to pt tolerance 15v Patient education on possible benefit of home TENS unit    PATIENT EDUCATION:  Education details: Exam findings, POC, TENs Person educated: Patient Education method: Explanation, Demonstration, and Handouts Education comprehension: verbalized understanding, returned demonstration, and needs further education  HOME EXERCISE PROGRAM: Access Code: [email or web address] URL: [email or web address] Date: 09/20/2024 Prepared by: Marko Molt  Exercises - Seated Diaphragmatic Breathing  - 2 x daily - 7 x weekly - 5 reps  Patient Education - Ice  ASSESSMENT:  CLINICAL IMPRESSION: 10/06/2024 Sherel has attended 5 visits since initial evaluation. Patient was sent to our clinic for possible TPDN services, however patient is unable to afford this OOP. She plans to f/u with MD to request possible injection that was discussed with her as another option. She has not MET established rehab goals. Patient is aware and familiar with HEP and plans to continue with this at this time. Patient will be discharged from skilled PT at this time.    Patient is a 63 y.o. F who was seen today for physical therapy evaluation and treatment for low back pain. PMH is significant for L4-5 spinal fusion in Feb 2025, chronic R foot drop (with AFO), recent carpal tunnel surgery, COVID with recent hospitalization, and history of multiple falls. Assessment is significant for gross bilat LE weakness, increased lumbar paraspinal and glute spasm, reduced hip and hamstring flexibility affecting tolerance to prolonged unsupported sitting and standing tasks. Pain limits/inhibits pt's movements. Has deferred dry needling due to financial cost. States Dr. Molt would like her to be seen here at the  ortho clinic to see if we can help with this flare up. Pt will benefit from PT to address these issues and improve home/community safety.   OBJECTIVE IMPAIRMENTS: Abnormal gait, decreased activity tolerance, decreased balance, decreased coordination, decreased endurance, decreased mobility, difficulty walking, decreased ROM, decreased strength, increased edema, increased fascial restrictions, increased muscle spasms, impaired flexibility, improper body mechanics, postural dysfunction, and pain.   ACTIVITY LIMITATIONS: carrying, lifting, bending, sitting, standing, squatting, stairs, transfers, bed mobility, hygiene/grooming, and locomotion level  PARTICIPATION LIMITATIONS: meal prep, cleaning, laundry, driving, shopping, and community activity  PERSONAL FACTORS: Age, Fitness, Past/current experiences, and Time since onset of injury/illness/exacerbation are also affecting patient's functional outcome.   REHAB POTENTIAL: Good  CLINICAL DECISION MAKING: Unstable/unpredictable  EVALUATION COMPLEXITY: High   GOALS: Goals reviewed  with patient? Yes  SHORT TERM GOALS: Target date: 10/07/2024   Pt will be ind with initial HEP Baseline: Goal status:partially met  2.  Pt will be able to perform 5x STS in </=30 sec Baseline: From prior PT note 42.97 sec 06/28/24 Goal status: not met >40sec   LONG TERM GOALS: Target date: 11/04/2024   Pt will be ind with management and progression of HEP Baseline:  Goal status:partially met  2.  Pt will report improved pain by >/=50% Baseline:  Goal status: not met; I think that it's because of the cold weather.   3.  Pt will have improved TUG to </=30 sec with rollator Baseline: 36.63 sec 08/02/24 10/06/24: 39 Goal status: not met  4.  Pt will have improved 5x STS to </=25 sec to demo increased functional LE Baseline:  Goal status: not met >40sec  5.  Pt will have improved modified Oswestry to </=12/50 to demo MCID Baseline: 24.8 10/06/24:  28/50 Goal status: not met     PLAN:  PT FREQUENCY: 2x/week  PT DURATION: 8 weeks  PLANNED INTERVENTIONS: 97164- PT Re-evaluation, 97110-Therapeutic exercises, 97530- Therapeutic activity, 97112- Neuromuscular re-education, 97535- Self Care, [id number]- Manual therapy, [phone]- Gait training, [phone]- Aquatic Therapy, [phone]- Electrical stimulation (unattended), [id number]- Ultrasound, [id number]- Ionotophoresis 4mg /ml Dexamethasone , 79439 (1-2 muscles), [id number] (3+ muscles)- Dry Needling, Patient/Family education, Balance training, Taping, Joint mobilization, Cryotherapy, and Moist heat.    Marko Molt, PT, DPT  10/09/2024 11:18 PM

## 2024-10-07 ENCOUNTER — Ambulatory Visit: Payer: PPO

## 2024-10-07 ENCOUNTER — Other Ambulatory Visit (HOSPITAL_COMMUNITY): Payer: Self-pay

## 2024-10-07 VITALS — BP 138/64 | HR 93 | Temp 98.1°F | Ht 65.0 in | Wt 163.0 lb

## 2024-10-07 DIAGNOSIS — Z0000 Encounter for general adult medical examination without abnormal findings: Secondary | ICD-10-CM

## 2024-10-07 NOTE — Patient Instructions (Addendum)
 [name],  Thank you for taking the time for your Medicare Wellness Visit. I appreciate your continued commitment to your health goals. Please review the care plan we discussed, and feel free to reach out if I can assist you further.  Please note that Annual Wellness Visits do not include a physical exam. Some assessments may be limited, especially if the visit was conducted virtually. If needed, we may recommend an in-person follow-up with your provider.  Ongoing Care Seeing your primary care provider every 3 to 6 months helps us  monitor your health and provide consistent, personalized care.   Referrals If a referral was made during today's visit and you haven't received any updates within two weeks, please contact the referred provider directly to check on the status.  Recommended Screenings:  Health Maintenance  Topic Date Due   Pneumococcal Vaccine for age over 29 (1 of 2 - PCV) Never done   Flu Shot  05/27/2024   DTaP/Tdap/Td vaccine (4 - Td or Tdap) 06/09/2024   COVID-19 Vaccine (7 - 2025-26 season) 06/27/2024   Pap with HPV screening  10/14/2024*   Medicare Annual Wellness Visit  10/07/2025   Breast Cancer Screening  01/31/2026   Colon Cancer Screening  09/30/2029   Hepatitis C Screening  Completed   HIV Screening  Completed   Zoster (Shingles) Vaccine  Completed   Hepatitis B Vaccine  Aged Out   HPV Vaccine  Aged Out   Meningitis B Vaccine  Aged Out  *Topic was postponed. The date shown is not the original due date.   Opioid Pain Medicine Management Opioids are powerful medicines that are used to treat moderate to severe pain. When used for short periods of time, they can help you to: Sleep better. Do better in physical or occupational therapy. Feel better in the first few days after an injury. Recover from surgery. Opioids should be taken with the supervision of a trained health care provider. They should be taken for the shortest period of time possible. This is  because opioids can be addictive, and the longer you take opioids, the greater your risk of addiction. This addiction can also be called opioid use disorder. What are the risks? Using opioid pain medicines for longer than 3 days increases your risk of side effects. Side effects include: Constipation. Nausea and vomiting. Breathing difficulties (respiratory depression). Drowsiness. Confusion. Opioid use disorder. Itching. Taking opioid pain medicine for a long period of time can affect your ability to do daily tasks. It also puts you at risk for: Motor vehicle crashes. Depression. Suicide. Heart attack. Overdose, which can be life-threatening. What is a pain treatment plan? A pain treatment plan is an agreement between you and your health care provider. Pain is unique to each person, and treatments vary depending on your condition. To manage your pain, you and your health care provider need to work together. To help you do this: Discuss the goals of your treatment, including how much pain you might expect to have and how you will manage the pain. Review the risks and benefits of taking opioid medicines. Remember that a good treatment plan uses more than one approach and minimizes the chance of side effects. Be honest about the amount of medicines you take and about any drug or alcohol use. Get pain medicine prescriptions from only one health care provider. Pain can be managed with many types of alternative treatments. Ask your health care provider to refer you to one or more specialists who can help  you manage pain through: Physical or occupational therapy. Counseling (cognitive behavioral therapy). Good nutrition. Biofeedback. Massage. Meditation. Non-opioid medicine. Following a gentle exercise program. How to use opioid pain medicine Taking medicine Take your pain medicine exactly as told by your health care provider. Take it only when you need it. If your pain gets less severe,  you may take less than your prescribed dose if your health care provider approves. If you are not having pain, do nottake pain medicine unless your health care provider tells you to take it. If your pain is severe, do nottry to treat it yourself by taking more pills than instructed on your prescription. Contact your health care provider for help. Write down the times when you take your pain medicine. It is easy to become confused while on pain medicine. Writing the time can help you avoid overdose. Take other over-the-counter or prescription medicines only as told by your health care provider. Keeping yourself and others safe  While you are taking opioid pain medicine: Do not drive, use machinery, or power tools. Do not sign legal documents. Do not drink alcohol. Do not take sleeping pills. Do not supervise children by yourself. Do not do activities that require climbing or being in high places. Do not go to a lake, river, ocean, spa, or swimming pool. Do not share your pain medicine with anyone. Keep pain medicine in a locked cabinet or in a secure area where pets and children cannot reach it. Stopping your use of opioids If you have been taking opioid medicine for more than a few weeks, you may need to slowly decrease (taper) how much you take until you stop completely. Tapering your use of opioids can decrease your risk of symptoms of withdrawal, such as: Pain and cramping in the abdomen. Nausea. Sweating. Sleepiness. Restlessness. Uncontrollable shaking (tremors). Cravings for the medicine. Do not attempt to taper your use of opioids on your own. Talk with your health care provider about how to do this. Your health care provider may prescribe a step-down schedule based on how much medicine you are taking and how long you have been taking it. Getting rid of leftover pills Do not save any leftover pills. Get rid of leftover pills safely by: Taking the medicine to a prescription  take-back program. This is usually offered by the county or law enforcement. Bringing them to a pharmacy that has a drug disposal container. Flushing them down the toilet. Check the label or package insert of your medicine to see whether this is safe to do. Throwing them out in the trash. Check the label or package insert of your medicine to see whether this is safe to do. If it is safe to throw it out, remove the medicine from the original container, put it into a sealable bag or container, and mix it with used coffee grounds, food scraps, dirt, or cat litter before putting it in the trash. Follow these instructions at home: Activity Do exercises as told by your health care provider. Avoid activities that make your pain worse. Return to your normal activities as told by your health care provider. Ask your health care provider what activities are safe for you. General instructions You may need to take these actions to prevent or treat constipation: Drink enough fluid to keep your urine pale yellow. Take over-the-counter or prescription medicines. Eat foods that are high in fiber, such as beans, whole grains, and fresh fruits and vegetables. Limit foods that are high in fat and processed  sugars, such as fried or sweet foods. Keep all follow-up visits. This is important. Where to find support If you have been taking opioids for a long time, you may benefit from receiving support for quitting from a local support group or counselor. Ask your health care provider for a referral to these resources in your area. Where to find more information Centers for Disease Control and Prevention (CDC): footballexhibition.com.br [place] Food and Drug Administration (FDA): pumpkinsearch.com.ee Get help right away if: You may have taken too much of an opioid (overdosed). Common symptoms of an overdose: Your breathing is slower or more shallow than normal. You have a very slow heartbeat (pulse). You have slurred speech. You have nausea  and vomiting. Your pupils become very small. You have other potential symptoms: You are very confused. You faint or feel like you will faint. You have cold, clammy skin. You have blue lips or fingernails. You have thoughts of harming yourself or harming others. These symptoms may represent a serious problem that is an emergency. Do not wait to see if the symptoms will go away. Get medical help right away. Call your local emergency services (911 in the [place] Do not drive yourself to the hospital.  If you ever feel like you may hurt yourself or others, or have thoughts about taking your own life, get help right away. Go to your nearest emergency department or: Call your local emergency services (911 in the [place] Call the [facility] (262-274-5296 in the [place] Call a suicide crisis helpline, such as the National Suicide Prevention Lifeline at 417 579 2910 or 988 in the [place] This is open 24 hours a day in the [place] If youre a Veteran: Call 988 and press 1. This is open 24 hours a day. Text the [employer] at 413-322-6366. Summary Opioid medicines can help you manage moderate to severe pain for a short period of time. A pain treatment plan is an agreement between you and your health care provider. Discuss the goals of your treatment, including how much pain you might expect to have and how you will manage the pain. If you think that you or someone else may have taken too much of an opioid, get medical help right away. This information is not intended to replace advice given to you by your health care provider. Make sure you discuss any questions you have with your health care provider. Document Revised: 07/20/2023 Document Reviewed: 01/23/2021 [employer] Patient Education  2024 [employer]    10/07/2024   11:30 AM  Advanced Directives  Does Patient Have a Medical Advance Directive? No  Would patient like information on creating a medical advance directive? No -  Patient declined    Vision: Annual vision screenings are recommended for early detection of glaucoma, cataracts, and diabetic retinopathy. These exams can also reveal signs of chronic conditions such as diabetes and high blood pressure.  Dental: Annual dental screenings help detect early signs of oral cancer, gum disease, and other conditions linked to overall health, including heart disease and diabetes.  Please see the attached documents for additional preventive care recommendations.

## 2024-10-07 NOTE — Progress Notes (Signed)
 Chief Complaint  Patient presents with   Medicare Wellness     Subjective:   [name] is a 63 y.o. female who presents for a Medicare Annual Wellness Visit.  Visit info / Clinical Intake: Medicare Wellness Visit Type:: Subsequent Annual Wellness Visit Persons participating in visit and providing information:: patient Medicare Wellness Visit Mode:: In-person (required for WTM) Interpreter Needed?: No Pre-visit prep was completed: no AWV questionnaire completed by patient prior to visit?: no Living arrangements:: lives with spouse/significant other Patient's Overall Health Status Rating: good Typical amount of pain: some Does pain affect daily life?: (!) yes (Followed by medical attention) Are you currently prescribed opioids?: (!) yes  Dietary Habits and Nutritional Risks How many meals a day?: (!) 1 Eats fruit and vegetables daily?: yes Most meals are obtained by: preparing own meals In the last 2 weeks, have you had any of the following?: none Diabetic:: no  Functional Status Activities of Daily Living (to include ambulation/medication): Independent Ambulation: Independent with device- listed below Home Assistive Devices/Equipment: Cane; Eyeglasses; Walker (specify Type); Wheelchair; Brace (specify type) Medication Administration: Independent Home Management (perform basic housework or laundry): Independent Manage your own finances?: yes Primary transportation is: driving; family / friends Concerns about vision?: no *vision screening is required for WTM* Concerns about hearing?: no  Fall Screening Falls in the past year?: 1 Number of falls in past year: 0 Was there an injury with Fall?: 1 Fall Risk Category Calculator: 3 Patient Fall Risk Level: High fall risk  Fall Risk Patient at Risk for Falls Due to: Mental status change Fall risk Follow up: Falls evaluation completed  Home and Transportation Safety: All rugs have non-skid backing?: N/A, no  rugs All stairs or steps have railings?: yes Grab bars in the bathtub or shower?: yes Have non-skid surface in bathtub or shower?: yes Good home lighting?: yes Regular seat belt use?: yes Hospital stays in the last year:: (!) yes How many hospital stays:: 1 Reason: COVID  Cognitive Assessment Difficulty concentrating, remembering, or making decisions? : no Will 6CIT or Mini Cog be Completed: yes What year is it?: 0 points What month is it?: 0 points Give patient an address phrase to remember (5 components): [address] [place]  About what time is it?: 0 points Count backwards from 20 to 1: 0 points Say the months of the year in reverse: 0 points Repeat the address phrase from earlier: 0 points 6 CIT Score: 0 points  Advance Directives (For Healthcare) Does Patient Have a Medical Advance Directive?: No Does patient want to make changes to medical advance directive?: No - Patient declined Copy of Living Will in Chart?: No - copy requested Would patient like information on creating a medical advance directive?: No - Patient declined  Reviewed/Updated  Reviewed/Updated: Reviewed All (Medical, Surgical, Family, Medications, Allergies, Care Teams, Patient Goals)    Allergies (verified) Cephalosporins; Penicillins; Anesthetics, amide; Betadine [povidone iodine]; Latex; Peach [prunus persica]; Xyzal  [levocetirizine]; and Claritin [loratadine]   Current Medications (verified) Outpatient Encounter Medications as of 10/07/2024  Medication Sig   acetaminophen  (TYLENOL ) 500 MG tablet Take 500 mg by mouth every 6 (six) hours as needed for headache.   albuterol  (VENTOLIN  HFA) 108 (90 Base) MCG/ACT inhaler Inhale 2 puffs into the lungs every 6 (six) hours as needed for wheezing or shortness of breath.   ALPRAZolam  (XANAX ) 0.5 MG tablet Take 0.5-1 tablets (0.25-0.5 mg total) by mouth at bedtime as needed for anxiety.   amitriptyline  (ELAVIL ) 25 MG tablet  Take 1 tablet (25 mg total)  by mouth daily.   amLODipine  (NORVASC ) 5 MG tablet Take 1 tablet (5 mg total) by mouth daily.   atorvastatin  (LIPITOR) 20 MG tablet Take 1 tablet (20 mg total) by mouth daily.   baclofen  (LIORESAL ) 10 MG tablet Take 1 tablet (10 mg total) by mouth 3 (three) times daily as needed.   betamethasone  dipropionate 0.05 % cream Apply as directed to itchy areas of skin on back only, avoiding normal skin, as needed.   carvedilol  (COREG ) 25 MG tablet Take 1 tablet (25 mg total) by mouth 2 (two) times daily with a meal.   cholecalciferol  (VITAMIN D3) 25 MCG (1000 UNIT) tablet Take 1,000 Units by mouth daily.   diphenhydrAMINE  (BENADRYL ) 25 MG tablet Take 25 mg by mouth daily as needed for itching.   docusate sodium  (COLACE) 100 MG capsule Take 100 mg by mouth 2 (two) times daily. (Patient not taking: Reported on 09/15/2024)   DULoxetine  (CYMBALTA ) 30 MG capsule Take 1 capsule (30 mg total) by mouth daily.   Estradiol  Acetate (FEMRING ) 0.05 MG/24HR RING USE ONE RING VAGINALLY EVERY 3 MONTHS AS DIRECTED   gabapentin  (NEURONTIN ) 600 MG tablet Take 0.5 tablets (300 mg total) by mouth 3 (three) times daily.   gabapentin  (NEURONTIN ) 600 MG tablet Take 1 tablet (600 mg total) by mouth 3 (three) times daily. (Patient not taking: Reported on 09/15/2024)   guaiFENesin  (MUCINEX ) 600 MG 12 hr tablet Take 2 tablets (1,200 mg total) by mouth 2 (two) times daily as needed.   hydrALAZINE  (APRESOLINE ) 50 MG tablet Take 1 tablet (50 mg total) by mouth 2 (two) times daily.   meclizine  (ANTIVERT ) 25 MG tablet Take 1 tablet (25 mg total) by mouth 3 (three) times daily as needed for dizziness.   methocarbamol  (ROBAXIN ) 500 MG tablet Take 1 tablet (500 mg total) by mouth 3 (three) times daily as needed for muscle spasm. (Patient not taking: Reported on 09/15/2024)   methocarbamol  (ROBAXIN ) 500 MG tablet Take 1 tablet (500 mg total) by mouth 3 (three) times daily as needed for muscle spasms.   mometasone  (NASONEX ) 50 MCG/ACT nasal  spray Place 2 sprays into the nose daily.   tapentadol  (NUCYNTA ) 50 MG tablet Take 1 tablet (50 mg total) by mouth every 6 (six) hours as needed.   tiZANidine  (ZANAFLEX ) 2 MG tablet Take 1 tablet (2 mg total) by mouth 3 (three) times daily.   [DISCONTINUED] levocetirizine (XYZAL  ALLERGY 24HR) 5 MG tablet Take 1 tablet (5 mg total) by mouth every evening.   [DISCONTINUED] omeprazole  (PRILOSEC) 40 MG capsule Take 1 capsule (40 mg total) by mouth daily.   No facility-administered encounter medications on file as of 10/07/2024.    History: Past Medical History:  Diagnosis Date   Anxiety    Aortic atherosclerosis 04/16/2021   Atypical angina    Back pain    related to spinal stenosis and disc problem, radiates down left buttocks to leg., weakness occ.   Chest pain    a. 03/2015 Cath: nl cors; b. 03/2021 Cor CTA: Ca2+ = 0. Nl Cors.   Chronic pain syndrome    Dyspnea    hx   GERD (gastroesophageal reflux disease)    Grade I diastolic dysfunction    Headache    Hyperlipidemia    Hypertension    Lumbar post-laminectomy syndrome    LVH (left ventricular hypertrophy) 12/15/2020   a. 11/2020 Echo: EF 65-70%, no rwma, sev asymm LVH with IVSd 1.9 cm. No  LVOT obs @ rest. Gr1 DD. Triv MR.   PONV (postoperative nausea and vomiting)    Pulmonary nodules    a. 03/2021 CT Chest: 3mm pulm nodules in bilat lower lobes. F/u 1 yr.   Right foot drop    Syncope    a. 11/2020 Zio: No significant arrhythmias.   Vaginal foreign object    Uses Femring    Past Surgical History:  Procedure Laterality Date   ABDOMINAL HYSTERECTOMY     ANTERIOR CERVICAL DECOMP/DISCECTOMY FUSION N/A 01/03/2022   Procedure: Anterior Cervical Decompression Fusion - Cervical four-Cervical five - Cervical five-Cervical six;  Surgeon: Joshua Alm RAMAN, MD;  Location[facility] OR;  Service: Neurosurgery;  Laterality: N/A;   BIOPSY  12/16/2020   Procedure: BIOPSY;  Surgeon: Aneita Gwendlyn DASEN, MD;  Location: [facility] ENDOSCOPY;  Service: Endoscopy;;    CARDIAC CATHETERIZATION N/A 04/18/2015   Procedure: Left Heart Cath and Coronary Angiography;  Surgeon: Rober Chroman, MD;  Location: [facility] INVASIVE CV LAB;  Service: Cardiovascular;  Laterality: N/A;   COLONOSCOPY     COLONOSCOPY W/ BIOPSIES AND POLYPECTOMY  2018   ESOPHAGOGASTRODUODENOSCOPY (EGD) WITH PROPOFOL  N/A 12/16/2020   Procedure: ESOPHAGOGASTRODUODENOSCOPY (EGD) WITH PROPOFOL ;  Surgeon: Aneita Gwendlyn DASEN, MD;  Location: [facility] ENDOSCOPY;  Service: Endoscopy;  Laterality: N/A;   FOOT SURGERY Bilateral    [facility] bunion,bone spur, tendon (1) -6'16, (1)-10'16   HEMATOMA EVACUATION N/A 01/05/2022   Procedure: Cervical Wound Exploration;  Surgeon: Gillie Duncans, MD;  Location: [facility] OR;  Service: Neurosurgery;  Laterality: N/A;   IR EPIDUROGRAPHY  07/21/2018   LUMBAR LAMINECTOMY/DECOMPRESSION MICRODISCECTOMY Bilateral 12/28/2015   Procedure: MICRO LUMBAR DECOMPRESSION L4 - L5 BILATERALLY;  Surgeon: Reyes Billing, MD;  Location: [facility] ORS;  Service: Orthopedics;  Laterality: Bilateral;   LUMBAR LAMINECTOMY/DECOMPRESSION MICRODISCECTOMY Bilateral 03/04/2018   Procedure: Revision of Microlumbar Decompression Bilateral Lumbar Four-Five;  Surgeon: Billing Reyes, MD;  Location: [facility] OR;  Service: Orthopedics;  Laterality: Bilateral;  90 mins   LUMBAR WOUND DEBRIDEMENT N/A 12/25/2023   Procedure: Irrigation and debridement of lumbar wound;  Surgeon: Joshua Alm Hamilton, MD;  Location: [facility] OR;  Service: Neurosurgery;  Laterality: N/A;   POSTERIOR FUSION PEDICLE SCREW PLACEMENT N/A 12/07/2023   Procedure: LUMBAR FOUR-FIVE POSTERIOR LATERAL FUSION;  Surgeon: Joshua Alm Hamilton, MD;  Location: [facility] OR;  Service: Neurosurgery;  Laterality: N/A;   SAVORY DILATION N/A 12/16/2020   Procedure: SAVORY DILATION;  Surgeon: Aneita Gwendlyn DASEN, MD;  Location[facility] ENDOSCOPY;  Service: Endoscopy;  Laterality: N/A;   SPINAL CORD STIMULATOR INSERTION N/A 09/28/2019   Procedure: THORACIC SPINAL CORD STIMULATOR INSERTION;   Surgeon: Burnetta Aures, MD;  Location: [facility] OR;  Service: Orthopedics;  Laterality: N/A;  2.5 hrs   SPINAL CORD STIMULATOR REMOVAL N/A 05/27/2021   Procedure: LUMBAR SPINAL CORD STIMULATOR REMOVAL;  Surgeon: Bluford Standing, MD;  Location: [facility] ORS;  Service: Neurosurgery;  Laterality: N/A;   TUBAL LIGATION     WISDOM TOOTH EXTRACTION     WOUND EXPLORATION N/A 03/04/2018   Procedure: EXPLORATION OF LUMBAR DECOMPRESSION WOUND;  Surgeon: Billing Reyes, MD;  Location: [facility] OR;  Service: Orthopedics;  Laterality: N/A;   Family History  Problem Relation Age of Onset   Heart attack Mother    Lung cancer Father    Cancer Father    Pancreatic cancer Sister    Breast cancer Sister 80   Multiple myeloma Sister    Breast cancer Sister        diagnosed in her 84's   Heart  attack Sister    Throat cancer Brother    Lung cancer Brother    Stomach cancer Cousin    Colon cancer Neg Hx    Colon polyps Neg Hx    Esophageal cancer Neg Hx    Rectal cancer Neg Hx    Social History   Occupational History   Not on file  Tobacco Use   Smoking status: Never   Smokeless tobacco: Never  Vaping Use   Vaping status: Never Used  Substance and Sexual Activity   Alcohol use: Never   Drug use: Never   Sexual activity: Yes    Partners: Male    Birth control/protection: Post-menopausal, Surgical    Comment: Hysterectomy   Tobacco Counseling Counseling given: No  SDOH Screenings   Food Insecurity: No Food Insecurity (10/07/2024)  Housing: Unknown (10/07/2024)  Transportation Needs: No Transportation Needs (10/07/2024)  Utilities: Not At Risk (10/07/2024)  Alcohol Screen: Low Risk (10/02/2023)  Depression (PHQ2-9): Low Risk (10/07/2024)  Recent Concern: Depression (PHQ2-9) - Medium Risk (08/09/2024)  Financial Resource Strain: Low Risk (05/02/2024)  Physical Activity: Insufficiently Active (10/07/2024)  Social Connections: Socially Integrated (10/07/2024)  Stress: No Stress Concern Present (10/07/2024)   Tobacco Use: Low Risk (10/07/2024)  Health Literacy: Adequate Health Literacy (10/07/2024)   See flowsheets for full screening details  Depression Screen PHQ 2 & 9 Depression Scale- Over the past 2 weeks, how often have you been bothered by any of the following problems? Little interest or pleasure in doing things: 0 Feeling down, depressed, or hopeless (PHQ Adolescent also includes...irritable): 0 PHQ-2 Total Score: 0 Trouble falling or staying asleep, or sleeping too much: 0 Feeling tired or having little energy: 0 Poor appetite or overeating (PHQ Adolescent also includes...weight loss): 0 Feeling bad about yourself - or that you are a failure or have let yourself or your family down: 0 Trouble concentrating on things, such as reading the newspaper or watching television (PHQ Adolescent also includes...like school work): 0 Moving or speaking so slowly that other people could have noticed. Or the opposite - being so fidgety or restless that you have been moving around a lot more than usual: 0 Thoughts that you would be better off dead, or of hurting yourself in some way: 0 PHQ-9 Total Score: 0 If you checked off any problems, how difficult have these problems made it for you to do your work, take care of things at home, or get along with other people?: Not difficult at all     Goals Addressed               This Visit's Progress     Increase physical activity (pt-stated)        Walk better             Objective:    Today's Vitals   10/07/24 1120  BP: 138/64  Pulse: 93  Temp: 98.1 F (36.7 C)  TempSrc: Oral  SpO2: 98%  Weight: 163 lb (73.9 kg)  Height: 5' 5 (1.651 m)   Body mass index is 27.12 kg/m.  Hearing/Vision screen Hearing Screening - Comments:: Denies hearing difficulties   Vision Screening - Comments:: Wears rx glasses - up to date with routine eye exams with  [facility] Immunizations and Health Maintenance Health Maintenance  Topic Date Due    Pneumococcal Vaccine: 50+ Years (1 of 2 - PCV) Never done   Influenza Vaccine  05/27/2024   DTaP/Tdap/Td (4 - Td or Tdap) 06/09/2024   COVID-19  Vaccine (7 - 2025-26 season) 06/27/2024   Cervical Cancer Screening (HPV/Pap Cotest)  10/14/2024 (Originally 08/08/1991)   Medicare Annual Wellness (AWV)  10/07/2025   Mammogram  01/31/2026   Colonoscopy  09/30/2029   Hepatitis C Screening  Completed   HIV Screening  Completed   Zoster Vaccines- Shingrix  Completed   Hepatitis B Vaccines 19-59 Average Risk  Aged Out   HPV VACCINES  Aged Out   Meningococcal B Vaccine  Aged Out        Assessment/Plan:  This is a routine wellness examination for [name].  Patient Care Team: Jordan, [name], MD as PCP - General (Family Medicine) Mona Vinie BROCKS, MD as PCP - Cardiology (Cardiology) Duwayne Purchase, MD as Consulting Physician (Orthopedic Surgery) Liane Sharyne MATSU, [facility] (Inactive) as Pharmacist (Pharmacist) Gordy Channing LABOR, RN as [facility] Care Management  I have personally reviewed and noted the following in the patients chart:   Medical and social history Use of alcohol, tobacco or illicit drugs  Current medications and supplements including opioid prescriptions. Functional ability and status Nutritional status Physical activity Advanced directives List of other physicians Hospitalizations, surgeries, and ER visits in previous 12 months Vitals Screenings to include cognitive, depression, and falls Referrals and appointments  No orders of the defined types were placed in this encounter.  In addition, I have reviewed and discussed with patient certain preventive protocols, quality metrics, and best practice recommendations. A written personalized care plan for preventive services as well as general preventive health recommendations were provided to patient.   Rojelio LELON Blush, LPN   [id number]   Return in 53 weeks (on 10/13/2025).  After Visit Summary: (In Person-Declined) Patient  declined AVS at this time.  Nurse Notes: No voiced or noted concerns at this time

## 2024-10-10 ENCOUNTER — Other Ambulatory Visit: Payer: Self-pay

## 2024-10-10 ENCOUNTER — Other Ambulatory Visit (HOSPITAL_COMMUNITY): Payer: Self-pay

## 2024-10-10 MED ORDER — BETAMETHASONE DIPROPIONATE 0.05 % EX CREA
TOPICAL_CREAM | CUTANEOUS | 0 refills | Status: AC
  Filled 2024-10-10: qty 45, 60d supply, fill #0
  Filled 2024-10-10: qty 45, 30d supply, fill #0

## 2024-10-14 ENCOUNTER — Ambulatory Visit: Admitting: Family Medicine

## 2024-10-14 ENCOUNTER — Encounter: Payer: Self-pay | Admitting: Family Medicine

## 2024-10-14 ENCOUNTER — Encounter

## 2024-10-14 ENCOUNTER — Ambulatory Visit: Admitting: Primary Care

## 2024-10-14 VITALS — BP 114/78 | HR 81 | Temp 97.2°F | Resp 16 | Ht 65.0 in | Wt 163.0 lb

## 2024-10-14 DIAGNOSIS — N1831 Chronic kidney disease, stage 3a: Secondary | ICD-10-CM | POA: Diagnosis not present

## 2024-10-14 DIAGNOSIS — F411 Generalized anxiety disorder: Secondary | ICD-10-CM

## 2024-10-14 DIAGNOSIS — I10 Essential (primary) hypertension: Secondary | ICD-10-CM | POA: Diagnosis not present

## 2024-10-14 DIAGNOSIS — G894 Chronic pain syndrome: Secondary | ICD-10-CM

## 2024-10-14 DIAGNOSIS — F419 Anxiety disorder, unspecified: Secondary | ICD-10-CM

## 2024-10-14 DIAGNOSIS — E559 Vitamin D deficiency, unspecified: Secondary | ICD-10-CM | POA: Diagnosis not present

## 2024-10-14 LAB — BASIC METABOLIC PANEL WITH GFR
BUN: 13 mg/dL (ref 6–23)
CO2: 29 meq/L (ref 19–32)
Calcium: 9.2 mg/dL (ref 8.4–10.5)
Chloride: 104 meq/L (ref 96–112)
Creatinine, Ser: 1.1 mg/dL (ref 0.40–1.20)
GFR: 53.6 mL/min — ABNORMAL LOW
Glucose, Bld: 88 mg/dL (ref 70–99)
Potassium: 3.9 meq/L (ref 3.5–5.1)
Sodium: 140 meq/L (ref 135–145)

## 2024-10-14 LAB — VITAMIN D 25 HYDROXY (VIT D DEFICIENCY, FRACTURES): VITD: 24.95 ng/mL — ABNORMAL LOW (ref 30.00–100.00)

## 2024-10-14 NOTE — Patient Instructions (Addendum)
 A few things to remember from today's visit:  Anxiety disorder, unspecified type  Stage 3a chronic kidney disease (HCC) - Plan: Basic metabolic panel with GFR, VITAMIN D  25 Hydroxy (Vit-D Deficiency, Fractures)  Essential hypertension  Vitamin D  deficiency, unspecified  No changes today. If you decide to try Buspar , let me know. Consider taking prescribed pain med more consistently.  If you need refills for medications you take chronically, please call your pharmacy. Do not use My Chart to request refills or for acute issues that need immediate attention. If you send a my chart message, it may take a few days to be addressed, specially if I am not in the office.  Please be sure medication list is accurate. If a new problem present, please set up appointment sooner than planned today.

## 2024-10-14 NOTE — Progress Notes (Unsigned)
 "  HPI: [name] is a 63 y.o. female, who is here today for chronic disease management.  Last seen on *** Lab Results  Component Value Date   NA 136 06/19/2024   CL 108 06/19/2024   K 5.0 06/19/2024   CO2 22 06/19/2024   BUN 12 06/19/2024   CREATININE 1.27 (H) 06/19/2024   GFRNONAA 48 (L) 06/19/2024   CALCIUM  8.6 (L) 06/19/2024   PHOS 2.7 06/01/2007   ALBUMIN 2.6 (L) 06/19/2024   GLUCOSE 134 (H) 06/19/2024   No results found for: LABMICR, MICROALBUR    *** Review of Systems See other pertinent positives and negatives in HPI.  Medications Ordered Prior to Encounter[1]  Past Medical History:  Diagnosis Date   Anxiety    Aortic atherosclerosis 04/16/2021   Atypical angina    Back pain    related to spinal stenosis and disc problem, radiates down left buttocks to leg., weakness occ.   Chest pain    a. 03/2015 Cath: nl cors; b. 03/2021 Cor CTA: Ca2+ = 0. Nl Cors.   Chronic pain syndrome    Dyspnea    hx   GERD (gastroesophageal reflux disease)    Grade I diastolic dysfunction    Headache    Hyperlipidemia    Hypertension    Lumbar post-laminectomy syndrome    LVH (left ventricular hypertrophy) 12/15/2020   a. 11/2020 Echo: EF 65-70%, no rwma, sev asymm LVH with IVSd 1.9 cm. No LVOT obs @ rest. Gr1 DD. Triv MR.   PONV (postoperative nausea and vomiting)    Pulmonary nodules    a. 03/2021 CT Chest: 3mm pulm nodules in bilat lower lobes. F/u 1 yr.   Right foot drop    Syncope    a. 11/2020 Zio: No significant arrhythmias.   Vaginal foreign object    Uses Femring    Allergies[2]  Social History   Socioeconomic History   Marital status: Married    Spouse name: Rayne Sprang   Number of children: 1   Years of education: Not on file   Highest education level: Not on file  Occupational History   Not on file  Tobacco Use   Smoking status: Never   Smokeless tobacco: Never  Vaping Use   Vaping status: Never Used  Substance and Sexual  Activity   Alcohol use: Never   Drug use: Never   Sexual activity: Yes    Partners: Male    Birth control/protection: Post-menopausal, Surgical    Comment: Hysterectomy  Other Topics Concern   Not on file  Social History Narrative   Not on file   Social Drivers of Health   Tobacco Use: Low Risk (10/14/2024)   Patient History    Smoking Tobacco Use: Never    Smokeless Tobacco Use: Never    Passive Exposure: Not on file  Financial Resource Strain: Low Risk (05/02/2024)   Overall Financial Resource Strain (CARDIA)    Difficulty of Paying Living Expenses: Not very hard  Food Insecurity: No Food Insecurity (10/07/2024)   Epic    Worried About Radiation Protection Practitioner of Food in the Last Year: Never true    Ran Out of Food in the Last Year: Never true  Transportation Needs: No Transportation Needs (10/07/2024)   Epic    Lack of Transportation (Medical): No    Lack of Transportation (Non-Medical): No  Physical Activity: Insufficiently Active (10/07/2024)   Exercise Vital Sign    Days of Exercise per Week: 2 days    Minutes  of Exercise per Session: 30 min  Stress: No Stress Concern Present (10/07/2024)   [employer] of Occupational Health - Occupational Stress Questionnaire    Feeling of Stress: Not at all  Social Connections: Socially Integrated (10/07/2024)   Social Connection and Isolation Panel    Frequency of Communication with Friends and Family: More than three times a week    Frequency of Social Gatherings with Friends and Family: More than three times a week    Attends Religious Services: More than 4 times per year    Active Member of Clubs or Organizations: Yes    Attends Banker Meetings: More than 4 times per year    Marital Status: Married  Depression (PHQ2-9): Low Risk (10/07/2024)   Depression (PHQ2-9)    PHQ-2 Score: 0  Recent Concern: Depression (PHQ2-9) - Medium Risk (08/09/2024)   Depression (PHQ2-9)    PHQ-2 Score: 5  Alcohol Screen: Low Risk  (10/02/2023)   Alcohol Screen    Last Alcohol Screening Score (AUDIT): 0  Housing: Unknown (10/07/2024)   Epic    Unable to Pay for Housing in the Last Year: No    Number of Times Moved in the Last Year: Not on file    Homeless in the Last Year: No  Utilities: Not At Risk (10/07/2024)   Epic    Threatened with loss of utilities: No  Health Literacy: Adequate Health Literacy (10/07/2024)   B1300 Health Literacy    Frequency of need for help with medical instructions: Never    Vitals:   10/14/24 1034  BP: 114/78  Pulse: 81  Resp: 16  Temp: (!) 97.2 F (36.2 C)  SpO2: 95%   Body mass index is 27.12 kg/m.  Physical Exam Vitals and nursing note reviewed.  Constitutional:      General: She is not in acute distress.    Appearance: She is well-developed.  HENT:     Head: Normocephalic and atraumatic.     Mouth/Throat:     Mouth: Mucous membranes are moist.     Pharynx: Oropharynx is clear.  Eyes:     Conjunctiva/sclera: Conjunctivae normal.  Cardiovascular:     Rate and Rhythm: Normal rate and regular rhythm.     Heart sounds: No murmur heard. Pulmonary:     Effort: Pulmonary effort is normal. No respiratory distress.     Breath sounds: Normal breath sounds.  Abdominal:     Palpations: Abdomen is soft.     Tenderness: There is no abdominal tenderness.  Musculoskeletal:     Cervical back: No bony tenderness.  Skin:    General: Skin is warm.     Findings: No erythema or rash.  Neurological:     General: No focal deficit present.     Mental Status: She is alert and oriented to person, place, and time.     Cranial Nerves: No cranial nerve deficit.     Gait: Gait normal.     Comments: Right foot drop, gait assisted with a walker.  Psychiatric:        Mood and Affect: Affect normal. Mood is anxious.     ASSESSMENT AND PLAN:  [name] was seen today for medical management of chronic issues.  Diagnoses and all orders for this visit:  Anxiety disorder, unspecified  type  Stage 3a chronic kidney disease (HCC)    No orders of the defined types were placed in this encounter.   No problem-specific Assessment & Plan notes found for this encounter.  No follow-ups on file.  Yulisa Chirico G. Kyndell Zeiser, MD  [facility]. [place] office.                [1]  Current Outpatient Medications on File Prior to Visit  Medication Sig Dispense Refill   acetaminophen  (TYLENOL ) 500 MG tablet Take 500 mg by mouth every 6 (six) hours as needed for headache.     albuterol  (VENTOLIN  HFA) 108 (90 Base) MCG/ACT inhaler Inhale 2 puffs into the lungs every 6 (six) hours as needed for wheezing or shortness of breath. 6.7 g 6   ALPRAZolam  (XANAX ) 0.5 MG tablet Take 0.5-1 tablets (0.25-0.5 mg total) by mouth at bedtime as needed for anxiety. 30 tablet 3   amitriptyline  (ELAVIL ) 25 MG tablet Take 1 tablet (25 mg total) by mouth daily. 90 tablet 2   amLODipine  (NORVASC ) 5 MG tablet Take 1 tablet (5 mg total) by mouth daily. 90 tablet 2   atorvastatin  (LIPITOR) 20 MG tablet Take 1 tablet (20 mg total) by mouth daily. 90 tablet 2   baclofen  (LIORESAL ) 10 MG tablet Take 1 tablet (10 mg total) by mouth 3 (three) times daily as needed. 60 each 3   betamethasone  dipropionate 0.05 % cream Apply as directed to itchy areas of skin on back only, avoiding normal skin, as needed. 45 g 0   carvedilol  (COREG ) 25 MG tablet Take 1 tablet (25 mg total) by mouth 2 (two) times daily with a meal. 180 tablet 3   cholecalciferol  (VITAMIN D3) 25 MCG (1000 UNIT) tablet Take 1,000 Units by mouth daily.     diphenhydrAMINE  (BENADRYL ) 25 MG tablet Take 25 mg by mouth daily as needed for itching.     DULoxetine  (CYMBALTA ) 30 MG capsule Take 1 capsule (30 mg total) by mouth daily.     Estradiol  Acetate (FEMRING ) 0.05 MG/24HR RING USE ONE RING VAGINALLY EVERY 3 MONTHS AS DIRECTED 1 each 4   gabapentin  (NEURONTIN ) 600 MG tablet Take 0.5 tablets (300 mg total) by mouth 3 (three) times daily.      guaiFENesin  (MUCINEX ) 600 MG 12 hr tablet Take 2 tablets (1,200 mg total) by mouth 2 (two) times daily as needed. 30 tablet 0   hydrALAZINE  (APRESOLINE ) 50 MG tablet Take 1 tablet (50 mg total) by mouth 2 (two) times daily. 180 tablet 0   meclizine  (ANTIVERT ) 25 MG tablet Take 1 tablet (25 mg total) by mouth 3 (three) times daily as needed for dizziness. 30 tablet 0   methocarbamol  (ROBAXIN ) 500 MG tablet Take 1 tablet (500 mg total) by mouth 3 (three) times daily as needed for muscle spasms. 30 tablet 1   mometasone  (NASONEX ) 50 MCG/ACT nasal spray Place 2 sprays into the nose daily. 17 g 3   tapentadol  (NUCYNTA ) 50 MG tablet Take 1 tablet (50 mg total) by mouth every 6 (six) hours as needed.     tiZANidine  (ZANAFLEX ) 2 MG tablet Take 1 tablet (2 mg total) by mouth 3 (three) times daily. 90 tablet 3   docusate sodium  (COLACE) 100 MG capsule Take 100 mg by mouth 2 (two) times daily. (Patient not taking: Reported on 10/14/2024)     gabapentin  (NEURONTIN ) 600 MG tablet Take 1 tablet (600 mg total) by mouth 3 (three) times daily. (Patient not taking: Reported on 10/14/2024) 90 tablet 3   methocarbamol  (ROBAXIN ) 500 MG tablet Take 1 tablet (500 mg total) by mouth 3 (three) times daily as needed for muscle spasm. (Patient not taking: Reported on 10/14/2024) 30 tablet  6   [DISCONTINUED] levocetirizine (XYZAL  ALLERGY 24HR) 5 MG tablet Take 1 tablet (5 mg total) by mouth every evening. 90 tablet 0   [DISCONTINUED] omeprazole  (PRILOSEC) 40 MG capsule Take 1 capsule (40 mg total) by mouth daily. 90 capsule 1   No current facility-administered medications on file prior to visit.  [2]  Allergies Allergen Reactions   Cephalosporins Anaphylaxis   Penicillins Anaphylaxis and Hives   Anesthetics, Amide Nausea And Vomiting    Does not know name of it. States they put it on record foot center.    Betadine [Povidone Iodine] Other (See Comments)    Skin Burn caused scar on Left buttock   Latex Hives,  Itching, Rash and Other (See Comments)   Peach [Prunus Persica] Hives   Xyzal  [Levocetirizine] Itching    Patient took Xyzal  for the first time to see if med would help her seasonal rhinitis d/t increased pollen - patient stated that she experienced intense itching over her entire body   Claritin [Loratadine]     pruritis    "

## 2024-10-15 ENCOUNTER — Ambulatory Visit: Payer: Self-pay | Admitting: Family Medicine

## 2024-10-15 NOTE — Assessment & Plan Note (Signed)
 Problem is well controlled. Continue Carvedilol  25 mg bid, Hydralazine  50 mg bid, and Amlodipine  5 mg daily as well as low salt diet. Continue monitoring BP at home.

## 2024-10-15 NOTE — Assessment & Plan Note (Signed)
 Problem has been stable. Last Cr 1.2 and e GFR 48 in 05/2024. Continue adequate hydration, low salt diet,and avoidance of NSAID's.

## 2024-10-15 NOTE — Assessment & Plan Note (Signed)
Continue current dose of vit D supplementation. Further recommendations according to 25 OH vit D result. 

## 2024-10-15 NOTE — Assessment & Plan Note (Signed)
 Exacerbated by chronic health problems, specially pain. She does not want to add medications at this time. She is on Duloxetine  30 mg bid for chronic pain. If needed Buspar  can be added. Continue Alprazolam  0.5 mg daily as needed.

## 2024-10-24 ENCOUNTER — Other Ambulatory Visit

## 2024-10-24 ENCOUNTER — Encounter: Payer: Self-pay | Admitting: *Deleted

## 2024-10-24 ENCOUNTER — Other Ambulatory Visit: Payer: Self-pay | Admitting: *Deleted

## 2024-11-02 ENCOUNTER — Ambulatory Visit

## 2024-11-02 DIAGNOSIS — Z23 Encounter for immunization: Secondary | ICD-10-CM

## 2024-11-03 ENCOUNTER — Ambulatory Visit: Payer: Self-pay

## 2024-11-03 NOTE — Telephone Encounter (Signed)
 Called and spoke with patient, patient doesn't want to drive because she is not feeling wel, Dr. Jordan doesn't;t have any appt for Friday and suggested patient go to UC or ED, patient said she would not see any other provider, patient was advised if symptoms continue to get worse she needs to go to [facility] or ED

## 2024-11-03 NOTE — Telephone Encounter (Signed)
 FYI Only or Action Required?: FYI only for provider: UC advised, refuses and would prefer to speak with PCP.  Patient was last seen in primary care on 10/14/2024 by Jordan, [name], MD.  Called Nurse Triage reporting Generalized Body Aches, Cough, and Fever.  Symptoms began yesterday.  Interventions attempted: Rest, hydration, or home remedies.  Symptoms are: gradually worsening.  Triage Disposition: See HCP Within 4 Hours (Or PCP Triage)  Patient/caregiver understands and will follow disposition?: No, wishes to speak with PCP  Reason for Disposition  MILD difficulty breathing (e.g., minimal/no SOB at rest, SOB with walking, pulse < 100)  Answer Assessment - Initial Assessment Questions Patient states that she was in office yesterday for flu shot. She does report feeling some symptoms yesterday morning prior to receiving flu shot, but yesterday afternoon symptoms  worsened. She is experiencing body aches, sore throat, fever, and cough. She states she had some mild SOB last night, but denies any current SOB. States she feels worse today. UC advised due to no available appts today, refuses. Patient would prefer to speak with her doctor.  1. SYMPTOMS: What is your main symptom or concern? (e.g., cough, fever, shortness of breath, muscle aches)     Cough, body aches  2. ONSET: When did the symptoms start?      Yesterday morning  3. COUGH: Do you have a cough? If Yes, ask: How bad is the cough?       Yes  4. FEVER: Do you have a fever? If Yes, ask: What is your temperature, how was it measured, and when did it start?     Yes, 100.8  5. BREATHING DIFFICULTY: Are you having any difficulty breathing? (e.g., normal; shortness of breath, wheezing, unable to speak)      Mild SOB  last night  6. BETTER-SAME-WORSE: Are you getting better, staying the same or getting worse compared to yesterday?  If getting worse, ask, In what way?     Worse  7. OTHER SYMPTOMS: Do you  have any other symptoms?  (e.g., chills, fatigue, headache, loss of smell or taste, muscle pain, sore throat)     Fatigue, headache, sore throat  8. INFLUENZA EXPOSURE: Was there any known exposure to influenza (flu) before the symptoms began?      No  9. INFLUENZA SUSPECTED: Why do you think you have influenza? (e.g., positive flu self-test at home, symptoms after exposure).     NA  10. INFLUENZA VACCINE: Have you had the flu vaccine? If Yes, ask: When did you last get it?       Yes, yesterday 1/7  11. HIGH RISK FOR COMPLICATIONS: Do you have any chronic medical problems? (e.g., asthma, heart or lung disease, obesity, weak immune system)       CHF  12. PREGNANCY: Is there any chance you are pregnant? When was your last menstrual period?       NA  13. O2 SATURATION MONITOR:  Do you use an oxygen saturation monitor (pulse oximeter) at home? If Yes, ask What is your reading (oxygen level) today? What is your usual oxygen saturation reading? (e.g., 95%)       No  Protocols used: Influenza (Flu) Suspected-A-AH  Copied from CRM [id number]. Topic: Clinical - Red Word Triage >> Nov 03, 2024  9:36 AM Jeoffrey H wrote: [facility] that prompted transfer to Nurse Triage: MRN [id number] patient complaining of aching all over, throat is closing in, coughing up mucous- not sure the color and fever this  morning is at 100.8 she stated she wanted to be seen today

## 2024-11-07 ENCOUNTER — Other Ambulatory Visit (HOSPITAL_COMMUNITY): Payer: Self-pay

## 2024-11-10 ENCOUNTER — Other Ambulatory Visit (HOSPITAL_COMMUNITY): Payer: Self-pay

## 2024-11-11 ENCOUNTER — Encounter

## 2024-11-11 ENCOUNTER — Ambulatory Visit: Admitting: Primary Care

## 2024-11-14 ENCOUNTER — Other Ambulatory Visit (HOSPITAL_COMMUNITY): Payer: Self-pay

## 2024-11-14 ENCOUNTER — Other Ambulatory Visit: Payer: Self-pay

## 2024-11-14 MED ORDER — BETAMETHASONE DIPROPIONATE 0.05 % EX CREA
1.0000 | TOPICAL_CREAM | Freq: Every day | CUTANEOUS | 11 refills | Status: AC | PRN
  Filled 2024-11-14: qty 45, 90d supply, fill #0

## 2024-11-15 ENCOUNTER — Other Ambulatory Visit (HOSPITAL_COMMUNITY): Payer: Self-pay

## 2024-11-16 ENCOUNTER — Other Ambulatory Visit (HOSPITAL_COMMUNITY): Payer: Self-pay

## 2024-11-16 ENCOUNTER — Encounter: Attending: Physical Medicine & Rehabilitation | Admitting: Physical Medicine & Rehabilitation

## 2024-11-16 ENCOUNTER — Encounter: Payer: Self-pay | Admitting: Physical Medicine & Rehabilitation

## 2024-11-16 VITALS — BP 114/77 | HR 91 | Ht 65.0 in | Wt 163.0 lb

## 2024-11-16 DIAGNOSIS — M7918 Myalgia, other site: Secondary | ICD-10-CM | POA: Diagnosis not present

## 2024-11-16 DIAGNOSIS — M961 Postlaminectomy syndrome, not elsewhere classified: Secondary | ICD-10-CM | POA: Diagnosis present

## 2024-11-16 MED ORDER — LIDOCAINE HCL 1 % IJ SOLN
2.0000 mL | Freq: Once | INTRAMUSCULAR | Status: AC
  Administered 2024-11-16: 2 mL

## 2024-11-16 NOTE — Progress Notes (Signed)
 Procedure note  Dx: Myofascial pain  Trigger Point Injections of back   After informed consent and preparation of the skin with isopropyl alcohol, I injected the bilateral lumbar paraspinals (L2-L4) with a total of 6cc of 1% lidocaine , 6 locations. The patient tolerated well, and no complications were experienced. Post-injection instructions were provided.   Will make a referral to PT to address gait and lumbar posture, rom

## 2024-11-16 NOTE — Patient Instructions (Signed)
 ALWAYS FEEL FREE TO CALL OUR OFFICE WITH ANY PROBLEMS OR QUESTIONS [phone])  **PLEASE NOTE** ALL MEDICATION REFILL REQUESTS (INCLUDING CONTROLLED SUBSTANCES) NEED TO BE MADE AT LEAST 7 DAYS PRIOR TO REFILL BEING DUE. ANY REFILL REQUESTS INSIDE THAT TIME FRAME MAY RESULT IN DELAYS IN RECEIVING YOUR PRESCRIPTION.

## 2024-11-23 ENCOUNTER — Telehealth: Payer: Self-pay | Admitting: *Deleted

## 2024-11-24 ENCOUNTER — Ambulatory Visit: Admitting: *Deleted

## 2024-11-24 ENCOUNTER — Ambulatory Visit

## 2024-11-24 ENCOUNTER — Other Ambulatory Visit (HOSPITAL_COMMUNITY): Payer: Self-pay

## 2024-11-24 ENCOUNTER — Other Ambulatory Visit: Payer: Self-pay

## 2024-11-24 VITALS — BP 122/74 | HR 81 | Temp 97.4°F | Ht 65.0 in | Wt 161.0 lb

## 2024-11-24 DIAGNOSIS — R053 Chronic cough: Secondary | ICD-10-CM | POA: Diagnosis not present

## 2024-11-24 DIAGNOSIS — J309 Allergic rhinitis, unspecified: Secondary | ICD-10-CM | POA: Diagnosis not present

## 2024-11-24 MED ORDER — GUAIFENESIN ER 600 MG PO TB12
1200.0000 mg | ORAL_TABLET | Freq: Two times a day (BID) | ORAL | 1 refills | Status: AC | PRN
  Filled 2024-11-24 (×2): qty 30, 8d supply, fill #0

## 2024-11-24 NOTE — Progress Notes (Signed)
 Patient unable to perform test. She could not blow out with force for required time.

## 2024-11-24 NOTE — Progress Notes (Signed)
 "  New Patient Pulmonology Office Visit   Subjective:  Patient ID: [name], female    DOB: 12-Feb-1961  MRN: [id number]  Referred by: Jordan, [name], MD  CC:  Chief Complaint  Patient presents with   Medical Management of Chronic Issues    Chronic cough- pft f/u  Pt states she sometimes has a coughing spell but it will go away. Water  helps    Referred to us  for chronic cough and shortness of breath ongoing since covid infection in Aug, 2025. She couldn't complete PFT as was unable to blow with force the required amount of time She is on disability and lives with her husband. Initial visit here in 08/2024 when she was grieving the loss of her close friend   [name] is a 64 y.o. female who is here for follow up.  Discussed the use of AI scribe software for clinical note transcription with the patient, who gave verbal consent to proceed.  History of Present Illness [name] is a 64 year old female who presents with a persistent cough.  She experiences intermittent coughing spells, with a notable episode occurring in the lobby prior to the appointment. The cough is sometimes severe, particularly at night, but is not as bad as during her last visit.  She has undergone a series of diagnostic tests, including a breathing test and an x-ray. She attempted the breathing test to assess for asthma, a condition her son has, but found the test challenging and was unable to complete it  She uses allergy medications, including Flonase or Zyrtec, to manage her symptoms. These medications can cause dryness, particularly in her nose, which was notably bad last week. She has also used a medication previously prescribed, believed to be Mucinex , and found it effective in reducing her cough and improving her sleep. Initially, she took it twice a day and then reduced to once a day as her symptoms improved.  No history of smoking.    ROS Review of  symptoms negative except mentioned above  Allergies: Cephalosporins; Penicillins; Anesthetics, amide; Betadine [povidone iodine]; Latex; Peach [prunus persica]; Xyzal  [levocetirizine]; and Claritin [loratadine] Current Medications[1] Past Medical History:  Diagnosis Date   Anxiety    Aortic atherosclerosis 04/16/2021   Atypical angina    Back pain    related to spinal stenosis and disc problem, radiates down left buttocks to leg., weakness occ.   Chest pain    a. 03/2015 Cath: nl cors; b. 03/2021 Cor CTA: Ca2+ = 0. Nl Cors.   Chronic pain syndrome    Dyspnea    hx   GERD (gastroesophageal reflux disease)    Grade I diastolic dysfunction    Headache    Hyperlipidemia    Hypertension    Lumbar post-laminectomy syndrome    LVH (left ventricular hypertrophy) 12/15/2020   a. 11/2020 Echo: EF 65-70%, no rwma, sev asymm LVH with IVSd 1.9 cm. No LVOT obs @ rest. Gr1 DD. Triv MR.   PONV (postoperative nausea and vomiting)    Pulmonary nodules    a. 03/2021 CT Chest: 3mm pulm nodules in bilat lower lobes. F/u 1 yr.   Right foot drop    Syncope    a. 11/2020 Zio: No significant arrhythmias.   Vaginal foreign object    Uses Femring    Past Surgical History:  Procedure Laterality Date   ABDOMINAL HYSTERECTOMY     ANTERIOR CERVICAL DECOMP/DISCECTOMY FUSION N/A 01/03/2022   Procedure: Anterior Cervical Decompression Fusion -  Cervical four-Cervical five - Cervical five-Cervical six;  Surgeon: Joshua Alm RAMAN, MD;  Location: [facility] OR;  Service: Neurosurgery;  Laterality: N/A;   BIOPSY  12/16/2020   Procedure: BIOPSY;  Surgeon: Aneita Gwendlyn DASEN, MD;  Location: [facility] ENDOSCOPY;  Service: Endoscopy;;   CARDIAC CATHETERIZATION N/A 04/18/2015   Procedure: Left Heart Cath and Coronary Angiography;  Surgeon: Rober Chroman, MD;  Location: [facility] INVASIVE CV LAB;  Service: Cardiovascular;  Laterality: N/A;   COLONOSCOPY     COLONOSCOPY W/ BIOPSIES AND POLYPECTOMY  2018   ESOPHAGOGASTRODUODENOSCOPY (EGD) WITH  PROPOFOL  N/A 12/16/2020   Procedure: ESOPHAGOGASTRODUODENOSCOPY (EGD) WITH PROPOFOL ;  Surgeon: Aneita Gwendlyn DASEN, MD;  Location: [facility] ENDOSCOPY;  Service: Endoscopy;  Laterality: N/A;   FOOT SURGERY Bilateral    [facility] bunion,bone spur, tendon (1) -6'16, (1)-10'16   HEMATOMA EVACUATION N/A 01/05/2022   Procedure: Cervical Wound Exploration;  Surgeon: Gillie Duncans, MD;  Location: [facility] OR;  Service: Neurosurgery;  Laterality: N/A;   IR EPIDUROGRAPHY  07/21/2018   LUMBAR LAMINECTOMY/DECOMPRESSION MICRODISCECTOMY Bilateral 12/28/2015   Procedure: MICRO LUMBAR DECOMPRESSION L4 - L5 BILATERALLY;  Surgeon: Reyes Billing, MD;  Location: [facility] ORS;  Service: Orthopedics;  Laterality: Bilateral;   LUMBAR LAMINECTOMY/DECOMPRESSION MICRODISCECTOMY Bilateral 03/04/2018   Procedure: Revision of Microlumbar Decompression Bilateral Lumbar Four-Five;  Surgeon: Billing Reyes, MD;  Location: [facility] OR;  Service: Orthopedics;  Laterality: Bilateral;  90 mins   LUMBAR WOUND DEBRIDEMENT N/A 12/25/2023   Procedure: Irrigation and debridement of lumbar wound;  Surgeon: Joshua Alm Hamilton, MD;  Location: [facility] OR;  Service: Neurosurgery;  Laterality: N/A;   POSTERIOR FUSION PEDICLE SCREW PLACEMENT N/A 12/07/2023   Procedure: LUMBAR FOUR-FIVE POSTERIOR LATERAL FUSION;  Surgeon: Joshua Alm Hamilton, MD;  Location: [facility] OR;  Service: Neurosurgery;  Laterality: N/A;   SAVORY DILATION N/A 12/16/2020   Procedure: SAVORY DILATION;  Surgeon: Aneita Gwendlyn DASEN, MD;  Location: [facility] ENDOSCOPY;  Service: Endoscopy;  Laterality: N/A;   SPINAL CORD STIMULATOR INSERTION N/A 09/28/2019   Procedure: THORACIC SPINAL CORD STIMULATOR INSERTION;  Surgeon: Burnetta Aures, MD;  Location: [facility] OR;  Service: Orthopedics;  Laterality: N/A;  2.5 hrs   SPINAL CORD STIMULATOR REMOVAL N/A 05/27/2021   Procedure: LUMBAR SPINAL CORD STIMULATOR REMOVAL;  Surgeon: Bluford Standing, MD;  Location: [facility] ORS;  Service: Neurosurgery;  Laterality: N/A;   TUBAL LIGATION      WISDOM TOOTH EXTRACTION     WOUND EXPLORATION N/A 03/04/2018   Procedure: EXPLORATION OF LUMBAR DECOMPRESSION WOUND;  Surgeon: Billing Reyes, MD;  Location: [facility] OR;  Service: Orthopedics;  Laterality: N/A;   Family History  Problem Relation Age of Onset   Heart attack Mother    Lung cancer Father    Cancer Father    Pancreatic cancer Sister    Breast cancer Sister 57   Multiple myeloma Sister    Breast cancer Sister        diagnosed in her 52's   Heart attack Sister    Throat cancer Brother    Lung cancer Brother    Stomach cancer Cousin    Colon cancer Neg Hx    Colon polyps Neg Hx    Esophageal cancer Neg Hx    Rectal cancer Neg Hx    Social History   Socioeconomic History   Marital status: Married    Spouse name: Rayne Burnetta   Number of children: 1   Years of education: Not on file   Highest education level: Not on file  Occupational History  Not on file  Tobacco Use   Smoking status: Never   Smokeless tobacco: Never  Vaping Use   Vaping status: Never Used  Substance and Sexual Activity   Alcohol use: Never   Drug use: Never   Sexual activity: Yes    Partners: Male    Birth control/protection: Post-menopausal, Surgical    Comment: Hysterectomy  Other Topics Concern   Not on file  Social History Narrative   Not on file   Social Drivers of Health   Tobacco Use: Low Risk (11/24/2024)   Patient History    Smoking Tobacco Use: Never    Smokeless Tobacco Use: Never    Passive Exposure: Not on file  Financial Resource Strain: Low Risk (05/02/2024)   Overall Financial Resource Strain (CARDIA)    Difficulty of Paying Living Expenses: Not very hard  Food Insecurity: No Food Insecurity (10/07/2024)   Epic    Worried About Radiation Protection Practitioner of Food in the Last Year: Never true    Ran Out of Food in the Last Year: Never true  Transportation Needs: No Transportation Needs (10/07/2024)   Epic    Lack of Transportation (Medical): No    Lack of Transportation  (Non-Medical): No  Physical Activity: Insufficiently Active (10/07/2024)   Exercise Vital Sign    Days of Exercise per Week: 2 days    Minutes of Exercise per Session: 30 min  Stress: No Stress Concern Present (10/07/2024)   [employer] of Occupational Health - Occupational Stress Questionnaire    Feeling of Stress: Not at all  Social Connections: Socially Integrated (10/07/2024)   Social Connection and Isolation Panel    Frequency of Communication with Friends and Family: More than three times a week    Frequency of Social Gatherings with Friends and Family: More than three times a week    Attends Religious Services: More than 4 times per year    Active Member of Clubs or Organizations: Yes    Attends Banker Meetings: More than 4 times per year    Marital Status: Married  Catering Manager Violence: Not At Risk (10/07/2024)   Epic    Fear of Current or Ex-Partner: No    Emotionally Abused: No    Physically Abused: No    Sexually Abused: No  Depression (PHQ2-9): Low Risk (10/07/2024)   Depression (PHQ2-9)    PHQ-2 Score: 0  Recent Concern: Depression (PHQ2-9) - Medium Risk (08/09/2024)   Depression (PHQ2-9)    PHQ-2 Score: 5  Alcohol Screen: Low Risk (10/02/2023)   Alcohol Screen    Last Alcohol Screening Score (AUDIT): 0  Housing: Unknown (10/07/2024)   Epic    Unable to Pay for Housing in the Last Year: No    Number of Times Moved in the Last Year: Not on file    Homeless in the Last Year: No  Utilities: Not At Risk (10/07/2024)   Epic    Threatened with loss of utilities: No  Health Literacy: Adequate Health Literacy (10/07/2024)   B1300 Health Literacy    Frequency of need for help with medical instructions: Never         Objective:  BP 122/74   Pulse 81   Temp (!) 97.4 F (36.3 C)   Ht 5' 5 (1.651 m) Comment: pt stated  Wt 161 lb (73 kg) Comment: pt stated  SpO2 98% Comment: ra  BMI 26.79 kg/m    Physical Exam Constitutional:       General: She is not  in acute distress.    Appearance: Normal appearance.  HENT:     Mouth/Throat:     Mouth: Mucous membranes are moist.  Cardiovascular:     Rate and Rhythm: Normal rate.  Pulmonary:     Effort: No respiratory distress.     Breath sounds: No wheezing or rales.  Musculoskeletal:     Right lower leg: No edema.     Left lower leg: No edema.  Skin:    General: Skin is warm.  Neurological:     Mental Status: She is alert and oriented to person, place, and time.  Psychiatric:        Mood and Affect: Mood normal.     Diagnostic Review:    Pft     No data to display               RADIOLOGY Chest X-ray: No pneumonia (05/2024)  05/2024 Eosinophils 3%, absolute 300   Chest xray 08/2024 within normal limits    Echo 01/2024 1. Left ventricular ejection fraction, by estimation, is 60 to 65%. The  left ventricle has normal function. The left ventricle has no regional  wall motion abnormalities. There is moderate asymmetric left ventricular  hypertrophy of the basal-septal  segment. Left ventricular diastolic parameters were normal. The average  left ventricular global longitudinal strain is -19.3 %. The global  longitudinal strain is normal.   2. Right ventricular systolic function is normal. The right ventricular  size is normal. There is normal pulmonary artery systolic pressure.   3. The mitral valve is normal in structure. Trivial mitral valve  regurgitation. No evidence of mitral stenosis.   4. The aortic valve is tricuspid. Aortic valve regurgitation is not  visualized. No aortic stenosis is present.   5. The inferior vena cava is normal in size with greater than 50%  respiratory variability, suggesting right atrial pressure of 3 mmHg.   Results       Assessment & Plan:   Assessment & Plan Chronic cough Improving now Still has minimal baseline symptoms Reviewed labs, chest ray and PFT today No eosinophilia or elevated igE Cxray within  normal limits Couldn't complete PFT- discussed empiric inhaler, however symptoms burden minimal at this time, so will hold off Use mucinex  as needed- refill provided  Orders:   guaiFENesin  (MUCINEX ) 600 MG 12 hr tablet; Take 2 tablets (1,200 mg total) by mouth 2 (two) times daily as needed.  Allergic sinusitis Continue OTC antihistaminics  Use humidifier at home during dry conditions       Thank you for the opportunity to take part in the care of [name]   Return in about 1 year (around 11/24/2025).   Deuce Paternoster Pleas, MD [facility] Office: [phone]     [1]  Current Outpatient Medications:    acetaminophen  (TYLENOL ) 500 MG tablet, Take 500 mg by mouth every 6 (six) hours as needed for headache., Disp: , Rfl:    albuterol  (VENTOLIN  HFA) 108 (90 Base) MCG/ACT inhaler, Inhale 2 puffs into the lungs every 6 (six) hours as needed for wheezing or shortness of breath., Disp: 6.7 g, Rfl: 6   ALPRAZolam  (XANAX ) 0.5 MG tablet, Take 0.5-1 tablets (0.25-0.5 mg total) by mouth at bedtime as needed for anxiety., Disp: 30 tablet, Rfl: 3   amitriptyline  (ELAVIL ) 25 MG tablet, Take 1 tablet (25 mg total) by mouth daily., Disp: 90 tablet, Rfl: 2   amLODipine  (NORVASC ) 5 MG tablet, Take 1 tablet (5 mg  total) by mouth daily., Disp: 90 tablet, Rfl: 2   atorvastatin  (LIPITOR) 20 MG tablet, Take 1 tablet (20 mg total) by mouth daily., Disp: 90 tablet, Rfl: 2   baclofen  (LIORESAL ) 10 MG tablet, Take 1 tablet (10 mg total) by mouth 3 (three) times daily as needed., Disp: 60 each, Rfl: 3   betamethasone  dipropionate 0.05 % cream, Apply as directed to itchy areas of skin on back only, avoiding normal skin, as needed., Disp: 45 g, Rfl: 0   betamethasone  dipropionate 0.05 % cream, Apply as directed to itchy areas of skin on back only, avoiding normal skin, as needed., Disp: 45 g, Rfl: 11   carvedilol  (COREG ) 25 MG tablet, Take 1 tablet (25 mg total) by mouth 2 (two) times  daily with a meal., Disp: 180 tablet, Rfl: 3   cholecalciferol  (VITAMIN D3) 25 MCG (1000 UNIT) tablet, Take 1,000 Units by mouth daily., Disp: , Rfl:    diphenhydrAMINE  (BENADRYL ) 25 MG tablet, Take 25 mg by mouth daily as needed for itching., Disp: , Rfl:    DULoxetine  (CYMBALTA ) 30 MG capsule, Take 1 capsule (30 mg total) by mouth daily., Disp: , Rfl:    Estradiol  Acetate (FEMRING ) 0.05 MG/24HR RING, USE ONE RING VAGINALLY EVERY 3 MONTHS AS DIRECTED, Disp: 1 each, Rfl: 4   gabapentin  (NEURONTIN ) 600 MG tablet, Take 0.5 tablets (300 mg total) by mouth 3 (three) times daily., Disp: , Rfl:    gabapentin  (NEURONTIN ) 600 MG tablet, Take 1 tablet (600 mg total) by mouth 3 (three) times daily., Disp: 90 tablet, Rfl: 3   hydrALAZINE  (APRESOLINE ) 50 MG tablet, Take 1 tablet (50 mg total) by mouth 2 (two) times daily., Disp: 180 tablet, Rfl: 0   meclizine  (ANTIVERT ) 25 MG tablet, Take 1 tablet (25 mg total) by mouth 3 (three) times daily as needed for dizziness., Disp: 30 tablet, Rfl: 0   methocarbamol  (ROBAXIN ) 500 MG tablet, Take 1 tablet (500 mg total) by mouth 3 (three) times daily as needed for muscle spasm., Disp: 30 tablet, Rfl: 6   methocarbamol  (ROBAXIN ) 500 MG tablet, Take 1 tablet (500 mg total) by mouth 3 (three) times daily as needed for muscle spasms., Disp: 30 tablet, Rfl: 1   mometasone  (NASONEX ) 50 MCG/ACT nasal spray, Place 2 sprays into the nose daily., Disp: 17 g, Rfl: 3   tapentadol  (NUCYNTA ) 50 MG tablet, Take 1 tablet (50 mg total) by mouth every 6 (six) hours as needed., Disp: , Rfl:    tiZANidine  (ZANAFLEX ) 2 MG tablet, Take 1 tablet (2 mg total) by mouth 3 (three) times daily., Disp: 90 tablet, Rfl: 3   docusate sodium  (COLACE) 100 MG capsule, Take 100 mg by mouth 2 (two) times daily. (Patient not taking: Reported on 11/24/2024), Disp: , Rfl:    guaiFENesin  (MUCINEX ) 600 MG 12 hr tablet, Take 2 tablets (1,200 mg total) by mouth 2 (two) times daily as needed., Disp: 30 tablet, Rfl:  1  "

## 2024-11-24 NOTE — Patient Instructions (Signed)
" °  VISIT SUMMARY: During your visit, we discussed your persistent cough and environmental allergies. We reviewed your recent diagnostic tests and current medications.  YOUR PLAN: CHRONIC COUGH: You have an intermittent cough that is sometimes severe at night. Previous tests were normal, and the cough may be related to allergies or airway irritation. -Continue to monitor your symptoms. -we can Consider empiric treatment for asthma if symptoms worsen. -Refilled Mucinex  for 30 days with one refill. Take it up to twice a day as needed.  ALLERGIES: You experience nasal dryness and irritation, likely due to allergies and use of antihistaminics  -Continue using Flonase and allergy pills as needed. -Consider using a humidifier to maintain indoor humidity.    Contains text generated by [employer].   "

## 2024-11-24 NOTE — Patient Instructions (Signed)
 Patient  unable to perform test.

## 2024-11-29 ENCOUNTER — Ambulatory Visit: Attending: Family Medicine | Admitting: Physical Therapy

## 2024-11-29 DIAGNOSIS — R2689 Other abnormalities of gait and mobility: Secondary | ICD-10-CM

## 2024-11-29 DIAGNOSIS — R2681 Unsteadiness on feet: Secondary | ICD-10-CM

## 2024-11-29 DIAGNOSIS — M6281 Muscle weakness (generalized): Secondary | ICD-10-CM

## 2024-11-29 DIAGNOSIS — M5459 Other low back pain: Secondary | ICD-10-CM

## 2024-11-30 ENCOUNTER — Encounter: Payer: Self-pay | Admitting: Physical Therapy

## 2024-12-01 ENCOUNTER — Ambulatory Visit: Payer: Self-pay | Admitting: Physical Therapy

## 2024-12-01 DIAGNOSIS — M6281 Muscle weakness (generalized): Secondary | ICD-10-CM

## 2024-12-01 DIAGNOSIS — M5459 Other low back pain: Secondary | ICD-10-CM

## 2024-12-01 NOTE — Therapy (Unsigned)
 " OUTPATIENT PHYSICAL THERAPY NEURO TREATMENT NOTE   Patient Name: [name] MRN: [id number] DOB:17-Feb-1961, 64 y.o., female Today's Date: 12/02/2024   PCP: Jordan, [name]., MD REFERRING PROVIDER: Babs Arthea DASEN, MD  END OF SESSION:  PT End of Session - 12/02/24 1515     Visit Number 2    Number of Visits 17    Date for Recertification  01/27/25    Authorization Type [employer]    Authorization Time Period no auth. required    Progress Note Due on Visit 10    PT Start Time 1146    PT Stop Time 1230    PT Time Calculation (min) 44 min    Activity Tolerance Patient tolerated treatment well    Behavior During Therapy WFL for tasks assessed/performed           Past Medical History:  Diagnosis Date   Anxiety    Aortic atherosclerosis 04/16/2021   Atypical angina    Back pain    related to spinal stenosis and disc problem, radiates down left buttocks to leg., weakness occ.   Chest pain    a. 03/2015 Cath: nl cors; b. 03/2021 Cor CTA: Ca2+ = 0. Nl Cors.   Chronic pain syndrome    Dyspnea    hx   GERD (gastroesophageal reflux disease)    Grade I diastolic dysfunction    Headache    Hyperlipidemia    Hypertension    Lumbar post-laminectomy syndrome    LVH (left ventricular hypertrophy) 12/15/2020   a. 11/2020 Echo: EF 65-70%, no rwma, sev asymm LVH with IVSd 1.9 cm. No LVOT obs @ rest. Gr1 DD. Triv MR.   PONV (postoperative nausea and vomiting)    Pulmonary nodules    a. 03/2021 CT Chest: 3mm pulm nodules in bilat lower lobes. F/u 1 yr.   Right foot drop    Syncope    a. 11/2020 Zio: No significant arrhythmias.   Vaginal foreign object    Uses Femring    Past Surgical History:  Procedure Laterality Date   ABDOMINAL HYSTERECTOMY     ANTERIOR CERVICAL DECOMP/DISCECTOMY FUSION N/A 01/03/2022   Procedure: Anterior Cervical Decompression Fusion - Cervical four-Cervical five - Cervical five-Cervical six;  Surgeon: Joshua Alm RAMAN, MD;  Location: [facility] OR;   Service: Neurosurgery;  Laterality: N/A;   BIOPSY  12/16/2020   Procedure: BIOPSY;  Surgeon: Aneita Gwendlyn DASEN, MD;  Location: [facility] ENDOSCOPY;  Service: Endoscopy;;   CARDIAC CATHETERIZATION N/A 04/18/2015   Procedure: Left Heart Cath and Coronary Angiography;  Surgeon: Rober Chroman, MD;  Location: [facility] INVASIVE CV LAB;  Service: Cardiovascular;  Laterality: N/A;   COLONOSCOPY     COLONOSCOPY W/ BIOPSIES AND POLYPECTOMY  2018   ESOPHAGOGASTRODUODENOSCOPY (EGD) WITH PROPOFOL  N/A 12/16/2020   Procedure: ESOPHAGOGASTRODUODENOSCOPY (EGD) WITH PROPOFOL ;  Surgeon: Aneita Gwendlyn DASEN, MD;  Location: [facility] ENDOSCOPY;  Service: Endoscopy;  Laterality: N/A;   FOOT SURGERY Bilateral    [facility] bunion,bone spur, tendon (1) -6'16, (1)-10'16   HEMATOMA EVACUATION N/A 01/05/2022   Procedure: Cervical Wound Exploration;  Surgeon: Gillie Duncans, MD;  Location: [facility] OR;  Service: Neurosurgery;  Laterality: N/A;   IR EPIDUROGRAPHY  07/21/2018   LUMBAR LAMINECTOMY/DECOMPRESSION MICRODISCECTOMY Bilateral 12/28/2015   Procedure: MICRO LUMBAR DECOMPRESSION L4 - L5 BILATERALLY;  Surgeon: Reyes Billing, MD;  Location: [facility] ORS;  Service: Orthopedics;  Laterality: Bilateral;   LUMBAR LAMINECTOMY/DECOMPRESSION MICRODISCECTOMY Bilateral 03/04/2018   Procedure: Revision of Microlumbar Decompression Bilateral Lumbar Four-Five;  Surgeon: Billing,  Reyes, MD;  Location: [facility] OR;  Service: Orthopedics;  Laterality: Bilateral;  90 mins   LUMBAR WOUND DEBRIDEMENT N/A 12/25/2023   Procedure: Irrigation and debridement of lumbar wound;  Surgeon: Joshua Alm Hamilton, MD;  Location: [facility] OR;  Service: Neurosurgery;  Laterality: N/A;   POSTERIOR FUSION PEDICLE SCREW PLACEMENT N/A 12/07/2023   Procedure: LUMBAR FOUR-FIVE POSTERIOR LATERAL FUSION;  Surgeon: Joshua Alm Hamilton, MD;  Location: [facility] OR;  Service: Neurosurgery;  Laterality: N/A;   SAVORY DILATION N/A 12/16/2020   Procedure: SAVORY DILATION;  Surgeon: Aneita Gwendlyn DASEN, MD;  Location:  [facility] ENDOSCOPY;  Service: Endoscopy;  Laterality: N/A;   SPINAL CORD STIMULATOR INSERTION N/A 09/28/2019   Procedure: THORACIC SPINAL CORD STIMULATOR INSERTION;  Surgeon: Burnetta Aures, MD;  Location: [facility] OR;  Service: Orthopedics;  Laterality: N/A;  2.5 hrs   SPINAL CORD STIMULATOR REMOVAL N/A 05/27/2021   Procedure: LUMBAR SPINAL CORD STIMULATOR REMOVAL;  Surgeon: Bluford Standing, MD;  Location: [facility] ORS;  Service: Neurosurgery;  Laterality: N/A;   TUBAL LIGATION     WISDOM TOOTH EXTRACTION     WOUND EXPLORATION N/A 03/04/2018   Procedure: EXPLORATION OF LUMBAR DECOMPRESSION WOUND;  Surgeon: Duwayne Reyes, MD;  Location: [facility] OR;  Service: Orthopedics;  Laterality: N/A;   Patient Active Problem List   Diagnosis Date Noted   Myofascial pain 11/16/2024   Vitamin D  deficiency, unspecified 04/13/2024   Seasonal allergic rhinitis due to pollen 01/29/2024   Delayed wound healing 12/25/2023   S/P lumbar fusion 12/07/2023   Pain in thumb joint with movement of right hand 08/12/2023   Localized primary osteoarthritis of carpometacarpal joint of right thumb 07/29/2023   Right foot drop 07/29/2023   Frequent falls 04/14/2023   Gastroesophageal reflux disease 04/14/2023   Right upper extremity numbness 09/29/2022   Stage 3a chronic kidney disease (HCC) 09/29/2022   Cervical spondylosis with radiculopathy 01/05/2022   S/P cervical spinal fusion 01/03/2022   Cervical disc disorder with radiculopathy of cervical region 11/27/2021   Uncontrolled pain 05/27/2021   Syncope 05/13/2021   Depression with anxiety 05/13/2021   Chronic diastolic CHF (congestive heart failure) (HCC) 05/13/2021   Chronic back pain 05/13/2021   Chest tightness 05/13/2021   Lower abdominal pain 05/13/2021   Syncope and collapse 01/01/2021   Abnormal echocardiogram 01/01/2021   BPPV (benign paroxysmal positional vertigo), left 12/26/2020   Odynophagia    Dysphagia 12/15/2020   Postural dizziness with presyncope 12/14/2020    Lumbar post-laminectomy syndrome    Chronic pain 09/28/2019   Lumbar spinal stenosis    Radicular pain    Gait disorder    Difficulty in urination 07/13/2018   Back pain 07/12/2018   Hyperlipidemia 04/16/2018   GAD (generalized anxiety disorder) 04/16/2018   AKI (acute kidney injury)    Benign essential HTN    Major depression, recurrent    Lumbar radiculopathy 03/09/2018   Paraparesis (HCC) 03/09/2018   Essential hypertension    Chronic nonintractable headache    Leukocytosis    Acute blood loss anemia    Postoperative pain    Generalized weakness    Spinal stenosis at L4-L5 level 03/04/2018   Spinal stenosis of lumbar region 12/28/2015   Chest pain 04/16/2015    ONSET DATE: Surgery date 12-07-23:  fall sustained in August 2025;  Referral date 11-16-24   REFERRING DIAG: M79.18 (ICD-10-CM) - Myofascial pain M96.1 (ICD-10-CM) - Lumbar post-laminectomy syndrome  THERAPY DIAG:  Other low back pain  Muscle weakness (generalized)  Rationale for Evaluation and  Treatment: Rehabilitation  SUBJECTIVE:                                                                                                                                                                                             SUBJECTIVE STATEMENT: Pt reports she put heat (electric heating pad) on her low back when she got home after PT evaluation on Tuesday - says it helped a lot.  Pt reports she did not do any exercises yesterday - pt reports she has a slight burning sensation on the Rt side of her low back region but states it does not radiate down her leg, but says it's not bad but I feel it     Pt accompanied by: self  PERTINENT HISTORY:  Fall from standing occurred during hospitalization for Covid in August 2025, resulting in inflammation in low back;   Per chart note The patient was admitted on 12/07/2023 &  underwent PLIF L4-5. Hospitalized 12-07-23 - 12-11-23:  The patient was admitted on 12/25/2023 and taken to  the operating room where the patient underwent irrigation and debridement of lumbar wound with primary closure and placement of external wound VAC- hospitalized 12-25-23 - 12-26-23    Frequent falls:  Spinal cord stimulator insertion on 09-28-19; stimulator removal on 05-27-21 with resultant LLE weakness;  decompression and laminectomy on 12/28/15 at L4-L5  with redo decompression surgery on May 9,2019, post-op paraparesis with surgery to remove small hematoma; 07/12/18 fall at work (involved rolling chair) with hospital admission, transfer to CIR with d/c home 08/03/18; HTN, Bil foot surgery; left ventricular hypertrophy, chronic pain syndrome, dyspnea, hypertrophic cardiomyopathy; cervical fusion March 2023;  had carpal tunnel surgery RUE in April 2024:  LVH    PAIN:   Are you having pain? Yes: NPRS scale: 6-7 currently, at best 4, at worst 9 Pain location: Low back and radiates to sides and down to sacrum Pain description: Burning Aggravating factors: bending, lifting, twisting; standing long periods Relieving factors: Exercises, ice   Next MD appt = 12-08-24 with Dr. Joshua; appt with Dr. Babs on 02-15-25  PRECAUTIONS: Fall and Other: Back  RED FLAGS: None   WEIGHT BEARING RESTRICTIONS: No  FALLS: Has patient fallen in last 6 months? Yes. Number of falls 2  LIVING ENVIRONMENT: Lives with: lives with their spouse Lives in: House/apartment Stairs: yes - 3 at garage entrance into home Has following equipment at home: Walker - 4 wheeled and Crutches  PLOF: Independent  PATIENT GOALS: improve RLE strength and decrease back pain  OBJECTIVE:  Note: Objective measures were completed at Evaluation unless otherwise noted.  DIAGNOSTIC FINDINGS: Lumbar CT 06/17/24  IMPRESSION: 1. No acute osseous abnormality in the Lumbar Spine. 2. L4-L5 decompression and fusion with no adverse hardware features. And lumbar spine degeneration elsewhere appears stable from march MRI, with multifactorial lumbar  spinal stenosis at L2-L3 and L3-L4. 3.  Aortic Atherosclerosis (ICD10-I70.0).  COGNITION: Overall cognitive status: Within functional limits for tasks assessed   SENSATION: WFL   MUSCLE TONE: RLE: Modifed Ashworth Scale 1+ = Slight increase in muscle tone, manifested by a catch, followed by minimal resistance throughout the remainder (less than half) of the ROM   POSTURE: No Significant postural limitations  LOWER EXTREMITY ROM:   LLE WNL's; pt wearing AFO on RLE; RLE WFL's except for ankle ROM which is minimal due to weakness; Pt has difficulty performing Rt knee extension due to Rt quad weakness and spasticity - turned body slightly to Lt to achieve more knee extension    LOWER EXTREMITY MMT:    MMT Right Eval Left Eval  Hip flexion 3- 4  Hip extension    Hip abduction    Hip adduction    Hip internal rotation    Hip external rotation    Knee flexion 4 with c/o pain 3+  Knee extension 3+ with c/o pain 4  Ankle dorsiflexion 2+ 4+  Ankle plantarflexion    Ankle inversion    Ankle eversion    (Blank rows = not tested)  BED MOBILITY:  Findings: modified independent  TRANSFERS: Sit to stand: Modified independence  Assistive device utilized: Environmental Consultant - 4 wheeled     Stand to sit: Modified independence  Assistive device utilized: Environmental Consultant - 4 wheeled      RAMP:  Not tested  CURB:  Not tested  STAIRS: Not tested GAIT: Findings: Gait Characteristics: decreased step length- Right, decreased stance time- Right, decreased hip/knee flexion- Right, and decreased ankle dorsiflexion- Right, Distance walked: 32' - then seated rest period needed due to pt reporting RLE instability , Assistive device utilized:Walker - 4 wheeled, Level of assistance: SBA, and Comments: slow gait speed due to c/o back pain & RLE weakness  FUNCTIONAL TESTS:  5 times sit to stand: 57.62 secs with bil. UE support from chair Timed up and go (TUG): NT due to RLE fatigue after 30m walk test 10 meter walk  test: 28.03 secs with rollator   PATIENT SURVEYS:  Modified Oswestry to be completed                                                                                                                               TREATMENT DATE: 12-01-24  TherEx: Moist heat pack applied to pt's lumbar region at start of session, pt lied supine on moist heat pack while performing following exercises;  Pelvic tilt 10 reps - 5 sec hold in supine Hip flexion in hooklying  10 reps LLE: 10 reps with min assist RLE; pelvic tilt performed prior to initiating hip flexion movement Hip abduction/adduction in hooklying - 10 reps -  no resistance Heel slides - LLE 10 reps:  5 reps RLE (each foot placed on pillowcase to reduce friction/increase ease of movement) Trunk rotation - small range due to c/o discomfort in Rt low back/hip region with rotation to Lt side- 10 reps to each side - approx. 3 sec hold SLR - LLE 10 reps - to approx. 30 degrees; pt unable to perform SLR with RLE due to c/o back pain and also due to weakness SAQ - no weight - 10 reps bil. LE's - 3-4 sec hold   PATIENT EDUCATION: Education details:  emphasized importance of performing HEP daily for lumbar stretching and also LAQ for quad strengthening Person educated: Patient Education method: Explanation Education comprehension: verbalized understanding  HOME EXERCISE PROGRAM: Reviewed HEP as still appropriate - will add as indicated  GOALS: Goals reviewed with patient? Yes  SHORT TERM GOALS: Target date: 12-30-24  Pt will increase gait velocity to >/= 1.5 ft/sec with use of rollator for incr. Gait efficiency.  Baseline:  28.03 secs with rollator = 1.17 ft/sec  Goal status: INITIAL  2.  Amb. 115' with RW with SBA without requiring seated rest period.  Baseline: 57' with RW with need for seated rest period due to c/o RLE instability/fatigue Goal status: INITIAL  3.  Perform 5x sit to stand with bil. UE support from chair to </= 47 secs to demo  increased RLE strength.  Baseline: 57.62 secs with bil. UE support from chair Goal status: INITIAL  4.  Report reduced low back pain at rest to </= 4/10 intensity.            Baseline: 6-7/10 on 11-29-24 Goal status: INITIAL  5.  Independent in HEP for RLE strengthening and lumbar stretching & strengthening. Baseline:  Goal status: INITIAL   LONG TERM GOALS: Target date: 01-27-25  Improve Modified Oswestry by at least 10 points to demo improvement in low back pain and functional status.  Baseline: 24.8 on 09-09-24 from previous PT note Goal status: INITIAL  2.    Pt will increase gait velocity to >/= 1.9 ft/sec with use of rollator for incr. Gait efficiency.  Baseline:  28.03 secs with rollator = 1.17 ft/sec  Goal status: INITIAL  3.   Perform 5x sit to stand with bil. UE support from chair to </= 38 secs to demo increased RLE strength.  Baseline: 57.62 secs with bil. UE support from chair Goal status: INITIAL  4.  Amb. 230' with rollator with SBA without requiring seated rest period. Baseline: 10' with RW Goal status: INITIAL  5.  Report reduced low back pain at rest to </= 3/10 intensity.            Baseline: 6-7/10 on 11-29-24 Goal status: INITIAL  6.  Independent in updated HEP for RLE strengthening and lumbar stretching & strengthening, including use of TENS unit for low back pain management.  Baseline:  Goal status: INITIAL  ASSESSMENT:  CLINICAL IMPRESSION: PT session focused on exercises for lumbar stretching and strengthening and LE strengthening.  Pt able to perform SLR with LLE, flexing hip approx. 30 degrees only due to c/o low back pain.  Pt unable to perform this exercise with RLE due to c/o low back pain.  No weights used for SAQ for quad strengthening due to c/o low back pain.  Pt declined performing SciFit exercise at end of session due to wanting to get home and check on husband who had recently fallen in the snow and was having hip  pain.  Pt performed exercises  lying supine on moist heat which she stated was beneficial in reducing low back pain.  Cont with POC.     OBJECTIVE IMPAIRMENTS:  Abnormal gait, decreased balance, decreased coordination, decreased endurance, decreased strength, and impaired tone.   ACTIVITY LIMITATIONS: carrying, lifting, bending, standing, squatting, stairs, transfers, and locomotion level  PARTICIPATION LIMITATIONS: meal prep, cleaning, laundry, shopping, and community activity  PERSONAL FACTORS: Behavior pattern, Past/current experiences, and Time since onset of injury/illness/exacerbation are also affecting patient's functional outcome.  are also affecting patient's functional outcome.   REHAB POTENTIAL: Fair due to chronicity of deficits  CLINICAL DECISION MAKING: Evolving/moderate complexity  EVALUATION COMPLEXITY: Moderate  PLAN:  PT FREQUENCY: 2x/week  PT DURATION: 8 weeks + eval  PLANNED INTERVENTIONS: 97110-Therapeutic exercises, 97530- Therapeutic activity, [id number]- Neuromuscular re-education, 97535- Self Care, [id number]- Manual therapy, [id number]- Gait training, [phone]- Aquatic Therapy, [phone]- Electrical stimulation (manual), Patient/Family education, Cryotherapy, and Moist heat  PLAN FOR NEXT SESSION: review/revise HEP; trial TENS with moist heat    Jelitza Manninen, [place], PT 12/02/2024, 3:16 PM        "

## 2024-12-02 ENCOUNTER — Telehealth: Admitting: *Deleted

## 2024-12-02 ENCOUNTER — Encounter: Payer: Self-pay | Admitting: Physical Therapy

## 2024-12-06 ENCOUNTER — Ambulatory Visit: Payer: Self-pay | Admitting: Physical Therapy

## 2024-12-06 ENCOUNTER — Telehealth: Admitting: *Deleted

## 2024-12-09 ENCOUNTER — Telehealth

## 2024-12-13 ENCOUNTER — Ambulatory Visit: Payer: Self-pay | Admitting: Physical Therapy

## 2024-12-20 ENCOUNTER — Ambulatory Visit: Payer: Self-pay | Admitting: Physical Therapy

## 2024-12-22 ENCOUNTER — Ambulatory Visit: Payer: Self-pay | Admitting: Physical Therapy

## 2024-12-27 ENCOUNTER — Ambulatory Visit: Payer: Self-pay | Admitting: Physical Therapy

## 2024-12-29 ENCOUNTER — Ambulatory Visit: Payer: Self-pay | Admitting: Physical Therapy

## 2025-01-02 ENCOUNTER — Ambulatory Visit: Payer: Self-pay | Admitting: Physical Therapy

## 2025-01-05 ENCOUNTER — Ambulatory Visit: Payer: Self-pay | Admitting: Physical Therapy

## 2025-02-15 ENCOUNTER — Encounter: Admitting: Physical Medicine & Rehabilitation

## 2025-04-14 ENCOUNTER — Ambulatory Visit: Admitting: Family Medicine

## 2025-10-13 ENCOUNTER — Ambulatory Visit
